# Patient Record
Sex: Female | Born: 1960 | Race: White | Hispanic: No | Smoking: Never smoker
Health system: Southern US, Community
[De-identification: ages and names within clinical notes are randomized; demographics above are authoritative.]

## PROBLEM LIST (undated history)

## (undated) DIAGNOSIS — I2510 Atherosclerotic heart disease of native coronary artery without angina pectoris: Secondary | ICD-10-CM

## (undated) DIAGNOSIS — E119 Type 2 diabetes mellitus without complications: Secondary | ICD-10-CM

## (undated) DIAGNOSIS — I809 Phlebitis and thrombophlebitis of unspecified site: Secondary | ICD-10-CM

## (undated) DIAGNOSIS — E785 Hyperlipidemia, unspecified: Secondary | ICD-10-CM

## (undated) DIAGNOSIS — J189 Pneumonia, unspecified organism: Secondary | ICD-10-CM

## (undated) DIAGNOSIS — F419 Anxiety disorder, unspecified: Secondary | ICD-10-CM

## (undated) DIAGNOSIS — I350 Nonrheumatic aortic (valve) stenosis: Secondary | ICD-10-CM

## (undated) DIAGNOSIS — I219 Acute myocardial infarction, unspecified: Secondary | ICD-10-CM

## (undated) DIAGNOSIS — E669 Obesity, unspecified: Secondary | ICD-10-CM

## (undated) DIAGNOSIS — IMO0002 Reserved for concepts with insufficient information to code with codable children: Secondary | ICD-10-CM

## (undated) DIAGNOSIS — E7800 Pure hypercholesterolemia, unspecified: Secondary | ICD-10-CM

## (undated) DIAGNOSIS — F32A Depression, unspecified: Secondary | ICD-10-CM

## (undated) DIAGNOSIS — I509 Heart failure, unspecified: Secondary | ICD-10-CM

## (undated) DIAGNOSIS — T7840XA Allergy, unspecified, initial encounter: Secondary | ICD-10-CM

## (undated) DIAGNOSIS — I10 Essential (primary) hypertension: Secondary | ICD-10-CM

## (undated) DIAGNOSIS — F329 Major depressive disorder, single episode, unspecified: Secondary | ICD-10-CM

## (undated) DIAGNOSIS — K219 Gastro-esophageal reflux disease without esophagitis: Secondary | ICD-10-CM

## (undated) DIAGNOSIS — R011 Cardiac murmur, unspecified: Secondary | ICD-10-CM

## (undated) DIAGNOSIS — M069 Rheumatoid arthritis, unspecified: Secondary | ICD-10-CM

## (undated) HISTORY — PX: FOOT FRACTURE SURGERY: SHX645

## (undated) HISTORY — DX: Phlebitis and thrombophlebitis of unspecified site: I80.9

## (undated) HISTORY — DX: Type 2 diabetes mellitus without complications: E11.9

## (undated) HISTORY — PX: CATARACT EXTRACTION: SUR2

## (undated) HISTORY — DX: Reserved for concepts with insufficient information to code with codable children: IMO0002

## (undated) HISTORY — PX: FRACTURE SURGERY: SHX138

## (undated) HISTORY — DX: Pure hypercholesterolemia, unspecified: E78.00

## (undated) HISTORY — PX: AORTIC VALVE REPLACEMENT (AVR)/CORONARY ARTERY BYPASS GRAFTING (CABG): SHX5725

## (undated) HISTORY — DX: Hyperlipidemia, unspecified: E78.5

## (undated) HISTORY — DX: Atherosclerotic heart disease of native coronary artery without angina pectoris: I25.10

## (undated) HISTORY — PX: NEUROPLASTY / TRANSPOSITION MEDIAN NERVE AT CARPAL TUNNEL: SUR893

## (undated) HISTORY — PX: CORONARY ARTERY BYPASS GRAFT: SHX141

## (undated) HISTORY — DX: Obesity, unspecified: E66.9

## (undated) HISTORY — DX: Heart failure, unspecified: I50.9

## (undated) HISTORY — PX: KNEE ARTHROSCOPY: SHX127

## (undated) HISTORY — DX: Rheumatoid arthritis, unspecified: M06.9

## (undated) HISTORY — DX: Allergy, unspecified, initial encounter: T78.40XA

## (undated) HISTORY — DX: Nonrheumatic aortic (valve) stenosis: I35.0

## (undated) HISTORY — DX: Essential (primary) hypertension: I10

---

## 1993-11-19 HISTORY — PX: TUBAL LIGATION: SHX77

## 2011-04-04 ENCOUNTER — Encounter (INDEPENDENT_AMBULATORY_CARE_PROVIDER_SITE_OTHER): Payer: Medicare Other

## 2011-04-04 DIAGNOSIS — I359 Nonrheumatic aortic valve disorder, unspecified: Secondary | ICD-10-CM

## 2011-04-05 NOTE — Consult Note (Signed)
[facility] NEW PATIENT CONSULTATION  Madison [name], Madison [name] DOB:  10-01-1961                                        Apr 04, 2011 CHART #:  [id number]  REASON FOR CONSULTATION:  Severe aortic stenosis.  CLINICAL HISTORY:  I was asked by Dr. Beverely Pace, to evaluate the patient for consideration of aortic valve replacement.  She is a 50 year old woman with a known history of aortic stenosis that has been followed with periodic echocardiogram.  Her last echocardiogram was done on January 15, 2011 and showed severe aortic stenosis with a valve area of 0.6 cm2 with a peak velocity of 559 cm/sec and peak gradient of 125 mmHg and the mean gradient of 76 mmHg.  There is mild aortic insufficiency.  It was felt that the valve was probably bicuspid.  Left ventricular size was normal with mild left ventricular hypertrophy and normal systolic function.  There is abnormal diastolic function.  The mitral valve and mild mitral annular calcification and trivial regurgitation.  The aortic root size appeared normal.  Ejection fraction was estimated 65%.  The patient subsequently underwent an exercise stress echocardiogram which showed ischemia noted in the basilar inferior wall with dyskinesis developing postexercise.  She did experience shortness of breath and test was terminated due to her shortness of breath, tiredness, and fatigue.  She underwent cardiac catheterization on Mar 29, 2011, which showed mild nonobstructive coronary artery disease with severe aortic stenosis with valve area of 0.6 cm2.  The peak great was measured 91 with mean gradient of 75.  REVIEW OF SYSTEMS:  GENERAL:  She denies any fever or chills.  She has had no recent weight changes.  She does report fatigue with exertion. EYES:  Negative.  She denies any amaurosis fugax. ENT:  Negative. ENDOCRINE:  She does have adult-onset diabetes.  She denies hypothyroidism. CARDIOVASCULAR:  She denies any chest pain or pressure.  She  does report exertional dyspnea.  She denies orthopnea or PND.  She has had no palpitations or peripheral edema. RESPIRATORY:  She denies cough and sputum production. GI:  She has had no nausea or vomiting.  She denies melena and bright red blood per rectum. GU:  She denies dysuria or hematuria. MUSCULOSKELETAL:  She does have rheumatoid arthritis. 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  She has had one episode in her left arm, face and leg became numb and weak for several hours and then resolved.  Her other 2 strokes were less severe but she does not remember the symptoms. She has been maintained on long-term Coumadin due to these strokes.  She said that all of her CT scans of the brain were normal.  She denies any focal weakness or numbness.  She does have occasional episodes of dizziness which she thought it was due to her blood sugar.  This usually occur with standing up from sitting position. PSYCHIATRIC:  Negative HEMATOLOGICAL:  She has no history of easy bleeding or bleeding disorders.  ALLERGIES:  None.  PAST MEDICAL HISTORY:  Significant for hypertension and dyslipidemia. She has a history of diabetes.  She does have rheumatoid arthritis.  She has a history of stroke as mentioned above.  Known history of aortic stenosis.  PAST SURGICAL HISTORY:  Bilateral carpal tunnel repair, C-section, and some surgery on her foot.  MEDICATIONS:  1. Ambien 10 mg nightly p.r.n. 2. Alprazolam 0.5 mg t.i.d. p.r.n. 3. Pravachol 40 mg daily. 4. Warfarin 7.5 mg daily.  INR managed by Dr. Glean Hess. 5. Cozaar 50 mg daily. 6. Hydrocodone/acetaminophen 7.5/500 mg p.r.n. 7. Celexa 40 mg daily. 8. Methotrexate 5 tablets q.7 days. 9. Humalog through insulin pump. 10.Folic acid 1 tablet daily on days that she does not take     methotrexate. 11.Prilosec 20 mg twice daily. 12.Metformin 1000 mg daily. 13.Cymbalta 30 mg daily.  SOCIAL HISTORY:  She is  divorced.  She has two children, age 16 years and 16 years.  She has never smoked and denies alcohol use.  FAMILY HISTORY:  Positive for stroke.  PHYSICAL EXAMINATION:  VITAL SIGNS:  Blood pressure is 115/75, pulse is 85 and regular, respiratory rate is 16 and unlabored.  She is an obese white female in no distress.  HEENT exam shows normocephalic and atraumatic.  Pupils are equal and reactive to light and accommodation. Extraocular muscles are intact.  Oropharynx is clear.  Neck exam shows normal carotid pulses bilaterally.  There is a transmitted murmur to both sides of her neck.  There is no adenopathy or thyromegaly.  Cardiac exam shows a regular rate and rhythm with grade 3/6 systolic murmur of aortic stenosis.  There is no diastolic murmur.  Lungs are clear. Abdominal exam shows active bowel sounds.  Abdomen is soft, obese and nontender.  No palpable masses or organomegaly.  Extremity exam shows no peripheral edema.  Pedal pulses are palpable bilaterally.  Skin is warm and dry.  Neurologic exam shows be alert and oriented x3.  Motor and sensory exams grossly normal.  IMPRESSION:  The patient has severe aortic stenosis that is symptomatic with an abnormal stress echocardiogram.  I agree at this time to proceed with aortic valve replacement.  Her ascending aorta appears to be abnormal size by echocardiogram and catheterization and does not require replacement.  Given her age I would plan to use mechanical valve, especially since she has been a long-term Coumadin already for three previous strokes.  It is possible that her strokes could be related to her calcified aortic valve.  I discussed the aortic valve replacement surgery with her and sister including use of mechanical valve.  We discussed the pros and cons of mechanical and tissue valves and she is in agreement with using a mechanical valve.  I discussed the benefits and risks of surgery including but not limited to bleeding,  blood transfusion, infection, stroke, myocardial infarction, heart block requiring permanent pacemaker, organ dysfunction, and death.  She understands all this and would like to proceed with surgery.  We will plan to proceed with surgery on Monday, Apr 16, 2011.  Evelene Croon, M.D. Electronically Signed  BB/MEDQ  D:  04/04/2011  T:  04/05/2011  Job:  [id number]  cc:   Dr. Glean Hess

## 2011-04-17 DIAGNOSIS — I359 Nonrheumatic aortic valve disorder, unspecified: Secondary | ICD-10-CM

## 2013-04-30 LAB — PROTIME-INR

## 2013-07-27 ENCOUNTER — Encounter: Payer: Self-pay | Admitting: Cardiovascular Disease

## 2013-07-27 ENCOUNTER — Encounter: Payer: Self-pay | Admitting: *Deleted

## 2013-07-27 DIAGNOSIS — I2510 Atherosclerotic heart disease of native coronary artery without angina pectoris: Secondary | ICD-10-CM | POA: Insufficient documentation

## 2013-07-27 DIAGNOSIS — I350 Nonrheumatic aortic (valve) stenosis: Secondary | ICD-10-CM | POA: Insufficient documentation

## 2013-07-27 DIAGNOSIS — M069 Rheumatoid arthritis, unspecified: Secondary | ICD-10-CM | POA: Insufficient documentation

## 2013-07-27 DIAGNOSIS — I639 Cerebral infarction, unspecified: Secondary | ICD-10-CM | POA: Insufficient documentation

## 2013-07-27 DIAGNOSIS — I10 Essential (primary) hypertension: Secondary | ICD-10-CM | POA: Insufficient documentation

## 2013-07-27 DIAGNOSIS — I809 Phlebitis and thrombophlebitis of unspecified site: Secondary | ICD-10-CM | POA: Insufficient documentation

## 2013-07-27 DIAGNOSIS — E669 Obesity, unspecified: Secondary | ICD-10-CM | POA: Insufficient documentation

## 2013-07-27 DIAGNOSIS — E785 Hyperlipidemia, unspecified: Secondary | ICD-10-CM | POA: Insufficient documentation

## 2013-07-28 ENCOUNTER — Ambulatory Visit (INDEPENDENT_AMBULATORY_CARE_PROVIDER_SITE_OTHER): Payer: PRIVATE HEALTH INSURANCE | Admitting: *Deleted

## 2013-07-28 ENCOUNTER — Ambulatory Visit (INDEPENDENT_AMBULATORY_CARE_PROVIDER_SITE_OTHER): Payer: PRIVATE HEALTH INSURANCE | Admitting: Cardiovascular Disease

## 2013-07-28 VITALS — BP 104/50 | Ht 63.0 in | Wt 130.0 lb

## 2013-07-28 DIAGNOSIS — I359 Nonrheumatic aortic valve disorder, unspecified: Secondary | ICD-10-CM | POA: Insufficient documentation

## 2013-07-28 DIAGNOSIS — Z952 Presence of prosthetic heart valve: Secondary | ICD-10-CM | POA: Insufficient documentation

## 2013-07-28 DIAGNOSIS — Z954 Presence of other heart-valve replacement: Secondary | ICD-10-CM

## 2013-07-28 DIAGNOSIS — Z7901 Long term (current) use of anticoagulants: Secondary | ICD-10-CM | POA: Insufficient documentation

## 2013-07-28 DIAGNOSIS — E119 Type 2 diabetes mellitus without complications: Secondary | ICD-10-CM

## 2013-07-28 MED ORDER — WARFARIN SODIUM 5 MG PO TABS: 5.0000 mg | ORAL_TABLET | Freq: Every day | ORAL | Status: DC

## 2013-07-28 NOTE — Assessment & Plan Note (Signed)
No bleeding issues  Coumadin 5mg  daily and 7.5 on Wends/Sunday.  Check INR today Script refilled F/U coumadin clinic

## 2013-07-28 NOTE — Patient Instructions (Addendum)

## 2013-07-28 NOTE — Assessment & Plan Note (Signed)
Well controlled.  Continue current medications and low sodium Dash type diet.    

## 2013-07-28 NOTE — Patient Instructions (Addendum)
Your physician wants you to follow-up in:    April   WITH DR   Eden Emms  ECHO SAME DAY   You will receive a reminder letter in the mail two months in advance. If you don't receive a letter, please call our office to schedule the follow-up appointment. Your physician has requested that you have an echocardiogram. Echocardiography is a painless test that uses sound waves to create images of your heart. It provides your doctor with information about the size and shape of your heart and how well your heart's chambers and valves are working. This procedure takes approximately one hour. There are no restrictions for this procedure. DUE IN  April  SEE DR Eden Emms SAME DAY You have been referred to PMD   [facility]  OFFICE

## 2013-07-28 NOTE — Assessment & Plan Note (Signed)
Discussed SBE prophylaxis.  Report indicates early perivalvular leak.  No AR on exam  F/U echo 4/15

## 2013-07-28 NOTE — Progress Notes (Signed)
Patient ID: Madison [name], female   DOB: 1961/02/21, 52 y.o.   MRN: [id number] 52 yo with previous care in [place].  History of bicuspid AV with AVR 2012. Chronic coumadin.  Echo done 03/02/13 showed EF normal Peak fradient and mean 8 mmHg across AVR with multiple perivalvular leaks and "strands"  Describes multiple strokes after AVR  Not clear if anticoagulation was adequate.  Currently has some aphasiia and left hand weakness but overall does well.  Needs a primary care doctor  Needs refills on coumadin and to be established in coumadin clinic.  No dyspnea, chest pain or syncope.  Sister drove her to her appt today.  Review of [facility] records show no CAD at cath prior to surgery   ROS: Denies fever, malais, weight loss, blurry vision, decreased visual acuity, cough, sputum, SOB, hemoptysis, pleuritic pain, palpitaitons, heartburn, abdominal pain, melena, lower extremity edema, claudication, or rash.  All other systems reviewed and negative   General: Affect appropriate Healthy:  appears stated age HEENT: normal Neck supple with no adenopathy JVP normal no bruits no thyromegaly Lungs clear with no wheezing and good diaphragmatic motion Heart:  S1/S2click SEM  no AR murmur,rub, gallop or click PMI normal Abdomen: benighn, BS positve, no tenderness, no AAA no bruit.  No HSM or HJR Distal pulses intact with no bruits No edema Neuro mild aphasia and left hand weakness  Right hand dominant Skin warm and dry No muscular weakness  Medications Current Outpatient Prescriptions  Medication Sig Dispense Refill  . aspirin 325 MG tablet Take 325 mg by mouth daily.      . baclofen (LIORESAL) 10 MG tablet Take 10 mg by mouth every morning.      . citalopram (CELEXA) 40 MG tablet Take 40 mg by mouth daily.      . metFORMIN (GLUCOPHAGE) 500 MG tablet Take 500 mg by mouth.      . pravastatin (PRAVACHOL) 20 MG tablet Take 20 mg by mouth daily.      . ranitidine (ZANTAC) 150 MG capsule Take 150 mg by mouth 2  (two) times daily.      Marland Kitchen warfarin (COUMADIN) 5 MG tablet Take 5 mg by mouth daily.       No current facility-administered medications for this visit.    Allergies Review of patient's allergies indicates no known allergies.  Family History: Family History  Problem Relation Age of Onset  . Stroke      Social History: History   Social History  . Marital Status: Single    Spouse Name: N/A    Number of Children: N/A  . Years of Education: N/A   Occupational History  . Not on file.   Social History Main Topics  . Smoking status: Never Smoker   . Smokeless tobacco: Not on file  . Alcohol Use: Not on file  . Drug Use: Not on file  . Sexual Activity: Not on file   Other Topics Concern  . Not on file   Social History Narrative  . No narrative on file    Electrocardiogram:  02/28/12  SR rate 77 nomral   Today NSR rate 77 LAE RAD   Assessment and Plan

## 2013-07-28 NOTE — Assessment & Plan Note (Signed)
Cholesterol is at goal.  Continue current dose of statin and diet Rx.  No myalgias or side effects.  F/U  LFT's in 6 months. No results found for this basename: LDLCALC  Labs to be checked by new primary

## 2013-07-31 ENCOUNTER — Ambulatory Visit (INDEPENDENT_AMBULATORY_CARE_PROVIDER_SITE_OTHER): Payer: PRIVATE HEALTH INSURANCE | Admitting: Endocrinology

## 2013-07-31 ENCOUNTER — Encounter: Payer: Self-pay | Admitting: Endocrinology

## 2013-07-31 VITALS — BP 130/70 | HR 78 | Ht 63.0 in | Wt 129.0 lb

## 2013-07-31 DIAGNOSIS — E1049 Type 1 diabetes mellitus with other diabetic neurological complication: Secondary | ICD-10-CM

## 2013-07-31 LAB — HEMOGLOBIN A1C: Hgb A1c MFr Bld: 11.8 % — ABNORMAL HIGH (ref 4.6–6.5)

## 2013-07-31 LAB — MICROALBUMIN / CREATININE URINE RATIO: Microalb, Ur: 0.1 mg/dL (ref 0.0–1.9)

## 2013-07-31 MED ORDER — INSULIN GLARGINE 100 UNIT/ML SOLOSTAR PEN: 5.0000 [IU] | PEN_INJECTOR | Freq: Every day | SUBCUTANEOUS | Status: DC

## 2013-07-31 MED ORDER — INSULIN ASPART 100 UNIT/ML FLEXPEN: 10.0000 [IU] | PEN_INJECTOR | Freq: Three times a day (TID) | SUBCUTANEOUS | Status: DC

## 2013-07-31 MED ORDER — INSULIN LISPRO 100 UNIT/ML (KWIKPEN): 10.0000 [IU] | PEN_INJECTOR | Freq: Three times a day (TID) | SUBCUTANEOUS | Status: DC

## 2013-07-31 NOTE — Patient Instructions (Addendum)
good diet and exercise habits significanly improve the control of your diabetes.  please let me know if you wish to be referred to a dietician.  high blood sugar is very risky to your health.  you should see an eye doctor every year.  You are at higher than average risk for pneumonia and hepatitis-B.  You should be vaccinated against both.   controlling your blood pressure and cholesterol drastically reduces the damage diabetes does to your body.  this also applies to quitting smoking.  please discuss these with your doctor.  check your blood sugar 4 times a day: before the 3 meals, and at bedtime.  also check if you have symptoms of your blood sugar being too high or too low.  please keep a record of the readings and bring it to your next appointment here.  please call us sooner if your blood sugar goes below 70, or if you have a lot of readings over 200. For now, please take 5 units of the lantus at bedtime, and: Take 10 units of novolog 3 times a day (just before each meal), no matter what your blood sugar is.   Please come back for a follow-up appointment in 2 weeks.

## 2013-07-31 NOTE — Progress Notes (Signed)
Subjective:    Patient ID: Madison [name], female    DOB: [birth year], 52 y.o.   MRN: [id number]  HPI pt states DM was dx'ed in 1981; he has moderate neuropathy of the lower extremities; she has associated retinopathy, CVA's, and CAD.  she has been on insulin since dx.  pt says his diet and exercise are good. Pt says she was on insulin pump rx until CVA's prevented her from using the device.   Pt describes her cbg's as extremely variable.  She takes lantus 7 units qd, and prn humalog (averages a total of approx 30 units per day).  She has hypoglycemia once-twice a day, usually in the middle of the day.   Past Medical History  Diagnosis Date  . CAD (coronary artery disease)   . Obesity   . Hypercholesteremia   . HTN (hypertension)   . Dyslipidemia   . Stroke syndrome   . Aortic stenosis   . Thrombophlebitis   . Diabetes   . Hyperlipidemia   . Rheumatoid arthritis(714.0)     Past Surgical History  Procedure Laterality Date  . Cesarean section    . Foot fracture surgery    . Knee surgery    . Neuroplasty / transposition median nerve at carpal tunnel      History   Social History  . Marital Status: Single    Spouse Name: N/A    Number of Children: N/A  . Years of Education: N/A   Occupational History  . Not on file.   Social History Main Topics  . Smoking status: Never Smoker   . Smokeless tobacco: Not on file  . Alcohol Use: Not on file  . Drug Use: Not on file  . Sexual Activity: Not on file   Other Topics Concern  . Not on file   Social History Narrative  . No narrative on file    Current Outpatient Prescriptions on File Prior to Visit  Medication Sig Dispense Refill  . aspirin 325 MG tablet Take 325 mg by mouth daily.      . baclofen (LIORESAL) 10 MG tablet Take 10 mg by mouth every morning.      . citalopram (CELEXA) 40 MG tablet Take 40 mg by mouth daily.      . metFORMIN (GLUCOPHAGE) 500 MG tablet Take 500 mg by mouth.      . pravastatin (PRAVACHOL) 20 MG  tablet Take 20 mg by mouth daily.      . ranitidine (ZANTAC) 150 MG capsule Take 150 mg by mouth 2 (two) times daily.      Marland Kitchen warfarin (COUMADIN) 5 MG tablet Take 1 tablet (5 mg total) by mouth daily.  45 tablet  3   No current facility-administered medications on file prior to visit.    No Known Allergies  Family History  Problem Relation Age of Onset  . Stroke    [id number] and 1 sib  BP 130/70  Pulse 78  Ht 5\' 3"  (1.6 m)  Wt 129 lb (58.514 kg)  BMI 22.86 kg/m2  SpO2 97%  Review of Systems denies blurry vision, headache, chest pain, sob, n/v, cramps, excessive diaphoresis, memory loss,  Menopausal sxs, and rhinorrhea.  She has lost 120 lbs over the past few years. She has polyuria and easy bruising.  Depression is pretty well-controlled.       Objective:   Physical Exam VS: see vs page GEN: no distress HEAD: head: no deformity eyes: no periorbital swelling, no proptosis external nose and  ears are normal mouth: no lesion seen NECK: supple, thyroid is not enlarged CHEST WALL: no deformity LUNGS:  Clear to auscultation CV: reg rate and rhythm, no murmur.  Mechanical valve click audible. ABD: abdomen is soft, nontender.  no hepatosplenomegaly.  not distended.  no hernia MUSCULOSKELETAL: muscle bulk and strength are grossly normal.  no obvious joint swelling.  gait is steady, with  cane PULSES: no carotid bruit NEURO:  cn 2-12 grossly intact, except for motor aphasia.   readily moves all 4's, but the LUE is weak. SKIN:  Normal texture and temperature.  No rash or suspicious lesion is visible.   NODES:  None palpable at the neck PSYCH: alert, oriented x3.  Does not appear anxious nor depressed.   outside test results are reviewed: (a1c was 9%, 2 mos ago) Lab Results  Component Value Date   HGBA1C 11.8* 07/31/2013      Assessment & Plan:  DM: This insulin regimen was chosen from multiple options, as it best matches her insulin to her changing requirements throughout the  day.  The benefits of glycemic control must be weighed against the risks of hypoglycemia.   CVA's: these limit her ability to use a pump. Weight loss, possibly due to hyperglycemia. CAD: in this context, she should avoid hypoglycemia

## 2013-08-04 ENCOUNTER — Ambulatory Visit (INDEPENDENT_AMBULATORY_CARE_PROVIDER_SITE_OTHER): Payer: PRIVATE HEALTH INSURANCE

## 2013-08-04 DIAGNOSIS — Z954 Presence of other heart-valve replacement: Secondary | ICD-10-CM

## 2013-08-04 DIAGNOSIS — I359 Nonrheumatic aortic valve disorder, unspecified: Secondary | ICD-10-CM

## 2013-08-04 DIAGNOSIS — Z7901 Long term (current) use of anticoagulants: Secondary | ICD-10-CM

## 2013-08-04 DIAGNOSIS — Z952 Presence of prosthetic heart valve: Secondary | ICD-10-CM

## 2013-08-04 LAB — POCT INR: INR: 5.6

## 2013-08-05 ENCOUNTER — Telehealth: Payer: Self-pay | Admitting: Endocrinology

## 2013-08-05 NOTE — Telephone Encounter (Signed)
Pt advised.

## 2013-08-05 NOTE — Telephone Encounter (Signed)
Please increase novolog to 15 units 3 times a day (just before each meal) Please continue the same lantus i'll see you next time.

## 2013-08-06 ENCOUNTER — Ambulatory Visit (INDEPENDENT_AMBULATORY_CARE_PROVIDER_SITE_OTHER): Payer: PRIVATE HEALTH INSURANCE | Admitting: Family

## 2013-08-06 ENCOUNTER — Encounter: Payer: Self-pay | Admitting: Family

## 2013-08-06 VITALS — BP 120/60 | HR 83 | Ht 62.5 in | Wt 131.5 lb

## 2013-08-06 DIAGNOSIS — Z1211 Encounter for screening for malignant neoplasm of colon: Secondary | ICD-10-CM

## 2013-08-06 DIAGNOSIS — E1165 Type 2 diabetes mellitus with hyperglycemia: Secondary | ICD-10-CM

## 2013-08-06 DIAGNOSIS — Z23 Encounter for immunization: Secondary | ICD-10-CM

## 2013-08-06 DIAGNOSIS — R32 Unspecified urinary incontinence: Secondary | ICD-10-CM

## 2013-08-06 DIAGNOSIS — IMO0001 Reserved for inherently not codable concepts without codable children: Secondary | ICD-10-CM

## 2013-08-06 DIAGNOSIS — I10 Essential (primary) hypertension: Secondary | ICD-10-CM

## 2013-08-06 DIAGNOSIS — M1990 Unspecified osteoarthritis, unspecified site: Secondary | ICD-10-CM

## 2013-08-06 DIAGNOSIS — F3289 Other specified depressive episodes: Secondary | ICD-10-CM

## 2013-08-06 DIAGNOSIS — F329 Major depressive disorder, single episode, unspecified: Secondary | ICD-10-CM

## 2013-08-06 LAB — POCT URINALYSIS DIPSTICK
Ketones, UA: NEGATIVE
Leukocytes, UA: NEGATIVE
Nitrite, UA: NEGATIVE
Protein, UA: NEGATIVE
pH, UA: 7

## 2013-08-06 MED ORDER — CELECOXIB 200 MG PO CAPS: 200.0000 mg | ORAL_CAPSULE | Freq: Two times a day (BID) | ORAL | Status: DC

## 2013-08-06 MED ORDER — TOLTERODINE TARTRATE ER 2 MG PO CP24: 2.0000 mg | ORAL_CAPSULE | Freq: Every day | ORAL | Status: DC

## 2013-08-06 MED ORDER — CITALOPRAM HYDROBROMIDE 40 MG PO TABS: 40.0000 mg | ORAL_TABLET | Freq: Every day | ORAL | Status: DC

## 2013-08-06 NOTE — Patient Instructions (Addendum)
Recheck INR in 10 days.   Overactive Bladder, Adult The bladder has two functions that are totally opposite of the other. One is to relax and stretch out so it can store urine (fills like a balloon), and the other is to contract and squeeze down so that it can empty the urine that it has stored. Proper functioning of the bladder is a complex mixing of these two functions. The filling and emptying of the bladder can be influenced by:  The bladder.  The spinal cord.  The brain.  The nerves going to the bladder.  Other organs that are closely related to the bladder such as prostate in males and the vagina in females. As your bladder fills with urine, nerve signals are sent from the bladder to the brain to tell you that you may need to urinate. Normal urination requires that the bladder squeeze down with sufficient strength to empty the bladder, but this also requires that the bladder squeeze down sufficiently long to finish the job. In addition the sphincter muscles, which normally keep you from leaking urine, must also relax so that the urine can pass. Coordination between the bladder muscle squeezing down and the sphincter muscles relaxing is required to make everything happen normally. With an overactive bladder sometimes the muscles of the bladder contract unexpectedly and involuntarily and this causes an urgent need to urinate. The normal response is to try to hold urine in by contracting the sphincter muscles. Sometimes the bladder contracts so strongly that the sphincter muscles cannot stop the urine from passing out and incontinence occurs. This kind of incontinence is called urge incontinence. Having an overactive bladder can be embarrassing and awkward. It can keep you from living life the way you want to. Many people think it is just something you have to put up with as you grow older or have certain health conditions. In fact, there are treatments that can help make your life easier and more  pleasant. CAUSES  Many things can cause an overactive bladder. Possibilities include:  Urinary tract infection or infection of nearby tissues such as the prostate.  Prostate enlargement.  In women, multiple pregnancies or surgery on the uterus or urethra.  Bladder stones, inflammation or tumors.  Caffeine.  Alcohol.  Medications. For example, diuretics (drugs that help the body get rid of extra fluid) increase urine production. Some other medicines must be taken with lots of fluids.  Muscle or nerve weakness. This might be the result of a spinal cord injury, a stroke, multiple sclerosis or Parkinson's disease.  Diabetes can cause a high urine volume which fills the bladder so quickly that the normal urge to urinate is triggered very strongly. SYMPTOMS   Loss of bladder control. You feel the need to urinate and cannot make your body wait.  Sudden, strong urges to urinate.  Urinating 8 or more times a day.  Waking up to urinate two or more times a night. DIAGNOSIS  To decide if you have overactive bladder, your healthcare provider will probably:  Ask about symptoms you have noticed.  Ask about your overall health. This will include questions about any medications you are taking.  Do a physical examination. This will help determine if there are obvious blockages or other problems.  Order some tests. These might include:  A blood test to check for diabetes or other health issues that could be contributing to the problem.  Urine testing. This could measure the flow of urine and the pressure on the  bladder.  A test of your neurological system (the brain, spinal cord and nerves). This is the system that senses the need to urinate. Some of these tests are called flow tests, bladder pressure tests and electrical measurements of the sphincter muscle.  A bladder test to check whether it is emptying completely when you urinate.  Cytoscopy. This test uses a thin tube with a tiny  camera on it. It offers a look inside your urethra and bladder to see if there are problems.  Imaging tests. You might be given a contrast dye and then asked to urinate. X-rays are taken to see how your bladder is working. TREATMENT  An overactive bladder can be treated in many ways. The treatment will depend on the cause. Whether you have a mild or severe case also makes a difference. Often, treatment can be given in your healthcare provider's office or clinic. Be sure to discuss the different options with your caregiver. They include:  Behavioral treatments. These do not involve medication or surgery:  Bladder training. For this, you would follow a schedule to urinate at regular intervals. This helps you learn to control the urge to urinate. At first, you might be asked to wait a few minutes after feeling the urge. In time, you should be able to schedule bathroom visits an hour or more apart.  Kegel exercises. These exercises strengthen the pelvic floor muscles, which support the bladder. By toning these muscles, they can help control urination, even if the bladder muscles are overactive. A specialist will teach you how to do these exercises correctly. They will require daily practice.  Weight loss. If you are obese or overweight, losing weight might stop your bladder from being overactive. Talk to your healthcare provider about how many pounds you should lose. Also ask if there is a specific program or method that would work best for you.  Diet change. This might be suggested if constipation is making your overactive bladder worse. Your healthcare provider or a nutritionist can explain ways to change what you eat to ease constipation. Other people might need to take in less caffeine or alcohol. Sometimes drinking fewer fluids is needed, too.  Protection. This is not an actual treatment. But, you could wear special pads to take care of any leakage while you wait for other treatments to take effect.  This will help you avoid embarrassment.  Physical treatments.  Electrical stimulation. Electrodes will send gentle pulses to the nerves or muscles that help control the bladder. The goal is to strengthen them. Sometimes this is done with the electrodes outside of the body. Or, they might be placed inside the body (implanted). This treatment can take several months to have an effect.  Medications. These are usually used along with other treatments. Several medicines are available. Some are injected into the muscles involved in urination. Others come in pill form. Medications sometimes prescribed include:  Anticholinergics. These drugs block the signals that the nerves deliver to the bladder. This keeps it from releasing urine at the wrong time. Researchers think the drugs might help in other ways, too.  Imipramine. This is an antidepressant. But, it relaxes bladder muscles.  Botox. This is still experimental. Some people believe that injecting it into the bladder muscles will relax them so they work more normally. It has also been injected into the sphincter muscle when the sphincter muscle does not open properly. This is a temporary fix, however. Also, it might make matters worse, especially in older people.  Surgery.  A device might be implanted to help manage your nerves. It works on the nerves that signal when you need to urinate.  Surgery is sometimes needed with electrical stimulation. If the electrodes are implanted, this is done through surgery.  Sometimes repairs need to be made through surgery. For example, the size of the bladder can be changed. This is usually done in severe cases only. HOME CARE INSTRUCTIONS   Take any medications your healthcare provider prescribed or suggested. Follow the directions carefully.  Practice any lifestyle changes that are recommended. These might include:  Drinking less fluid or drinking at different times of the day. If you need to urinate often  during the night, for example, you may need to stop drinking fluids early in the evening.  Cutting down on caffeine or alcohol. They can both make an overactive bladder worse. Caffeine is found in coffee, tea and sodas.  Doing Kegel exercises to strengthen muscles.  Losing weight, if that is recommended.  Eating a healthy and balanced diet. This will help you avoid constipation.  Keep a journal or a log. You might be asked to record how much you drink and when, and also when you feel the need to urinate.  Learn how to care for implants or other devices, such as pessaries. SEEK MEDICAL CARE IF:   Your overactive bladder gets worse.  You feel increased pain or irritation when you urinate.  You notice blood in your urine.  You have questions about any medications or devices that your healthcare provider recommended.  You notice blood, pus or swelling at the site of any test or treatment procedure.  You have an oral temperature above 102 F (38.9 C). SEEK IMMEDIATE MEDICAL CARE IF:  You have an oral temperature above 102 F (38.9 C), not controlled by medicine. Document Released: 09/01/2009 Document Revised: 01/28/2012 Document Reviewed: 09/01/2009 [facility] Patient Information 2014 [place], [place].

## 2013-08-06 NOTE — Progress Notes (Signed)
Subjective:    Patient ID: Madison [name], female    DOB: 1961/10/08, 52 y.o.   MRN: [id number]  HPI 52 year old white female, nonsmoker, new patient to the practice and to be established. She has a history of coronary artery disease, hyperlipidemia, hypertension, CVA, type 2 diabetes, left-sided weakness, and aortic valve disorder requiring Coumadin therapy.. Today, she has complaints of osteoarthritis pain in her right hand that typically occurs first thing in the morning and better as the day progresses. She's been taking Tylenol is not helping.  Also has concerns of urinary frequency, and urgency x6 months. Patient believes that the symptoms started shortly after having an indwelling catheter in the hospital. She denies any burning with urination, blood in her urine or foul-smelling urine.  Review of Systems  HENT: Negative.   Respiratory: Negative.   Cardiovascular: Negative.   Gastrointestinal: Negative.   Endocrine: Negative.   Genitourinary: Positive for urgency and frequency. Negative for dysuria and flank pain.  Musculoskeletal: Positive for arthralgias.       Right hand arthralgia  Skin: Negative.   Neurological: Positive for weakness.       Left hand weakness  Psychiatric/Behavioral: Negative.    Past Medical History  Diagnosis Date  . CAD (coronary artery disease)   . Obesity   . Hypercholesteremia   . HTN (hypertension)   . Dyslipidemia   . Stroke syndrome   . Aortic stenosis   . Thrombophlebitis   . Diabetes   . Hyperlipidemia   . Rheumatoid arthritis(714.0)     History   Social History  . Marital Status: Single    Spouse Name: N/A    Number of Children: N/A  . Years of Education: N/A   Occupational History  . Not on file.   Social History Main Topics  . Smoking status: Never Smoker   . Smokeless tobacco: Not on file  . Alcohol Use: Not on file  . Drug Use: Not on file  . Sexual Activity: Not on file   Other Topics Concern  . Not on file   Social  History Narrative  . No narrative on file    Past Surgical History  Procedure Laterality Date  . Cesarean section    . Foot fracture surgery    . Knee surgery    . Neuroplasty / transposition median nerve at carpal tunnel      Family History  Problem Relation Age of Onset  . Stroke      No Known Allergies  Current Outpatient Prescriptions on File Prior to Visit  Medication Sig Dispense Refill  . aspirin 325 MG tablet Take 325 mg by mouth daily.      . baclofen (LIORESAL) 10 MG tablet Take 10 mg by mouth every morning.      . Insulin Glargine (LANTUS SOLOSTAR) 100 UNIT/ML SOPN Inject 5 Units into the skin at bedtime.  5 pen  PRN  . insulin lispro (HUMALOG KWIKPEN) 100 UNIT/ML SOPN Inject 10 Units into the skin 3 (three) times daily with meals. And pen needles 4/day  10 pen  3  . metFORMIN (GLUCOPHAGE) 500 MG tablet Take 500 mg by mouth.      . pravastatin (PRAVACHOL) 20 MG tablet Take 20 mg by mouth daily.      . ranitidine (ZANTAC) 150 MG capsule Take 150 mg by mouth 2 (two) times daily.      Marland Kitchen warfarin (COUMADIN) 5 MG tablet Take 1 tablet (5 mg total) by mouth daily.  45 tablet  3   No current facility-administered medications on file prior to visit.    BP 120/60  Pulse 83  Ht 5' 2.5" (1.588 m)  Wt 131 lb 8 oz (59.648 kg)  BMI 23.65 kg/m2chart    Objective:   Physical Exam  Constitutional: She is oriented to person, place, and time. She appears well-developed and well-nourished.  HENT:  Right Ear: External ear normal.  Left Ear: External ear normal.  Mouth/Throat: Oropharynx is clear and moist.  Neck: Normal range of motion. Neck supple.  Cardiovascular: Normal rate, regular rhythm and normal heart sounds.   Pulmonary/Chest: Effort normal and breath sounds normal.  Abdominal: Soft. Bowel sounds are normal. She exhibits no distension. There is no tenderness. There is no rebound.  Musculoskeletal: She exhibits no edema and no tenderness.  Left upper extremity  weakness related to CVA. Grip strength decreased.  Neurological: She is alert and oriented to person, place, and time.  Skin: Skin is warm and dry.  Psychiatric: She has a normal mood and affect.          Assessment & Plan:  Assessment: 1. Coronary artery disease 2. Hyperlipidemia 3. Hypertension 4. Type 2 diabetes-uncontrolled 5. Left-sided weakness 6. Aortic valve disorder 7. Overactive bladder A. Osteoarthritis  Plan: Recheck INR in 7-10 days. Start Celebrex 200 mg once a day osteoarthritis. Warned of the potential for INR to be increased. Detrol LA 2 mg once daily. Recheck patient in 3 weeks to be sure she's happy at this dose. If not will increase to 4 mg. Continue to see cardiology and endocrinology as scheduled.

## 2013-08-13 ENCOUNTER — Ambulatory Visit (INDEPENDENT_AMBULATORY_CARE_PROVIDER_SITE_OTHER): Payer: PRIVATE HEALTH INSURANCE

## 2013-08-13 ENCOUNTER — Telehealth: Payer: Self-pay

## 2013-08-13 DIAGNOSIS — Z954 Presence of other heart-valve replacement: Secondary | ICD-10-CM

## 2013-08-13 DIAGNOSIS — I359 Nonrheumatic aortic valve disorder, unspecified: Secondary | ICD-10-CM

## 2013-08-13 DIAGNOSIS — Z952 Presence of prosthetic heart valve: Secondary | ICD-10-CM

## 2013-08-13 DIAGNOSIS — Z7901 Long term (current) use of anticoagulants: Secondary | ICD-10-CM

## 2013-08-13 NOTE — Telephone Encounter (Signed)
Called spoke with pt advised per Dr Eden Emms reduce ASA from 325mg  QD to 81mg  QD.  Pt verbalized understanding.

## 2013-08-13 NOTE — Telephone Encounter (Signed)
Message copied by Migdalia Dk on Thu Aug 13, 2013 11:49 AM ------      Message from: Wendall Stade      Created: Thu Aug 13, 2013 10:46 AM       Can reduce to 81 mg       ----- Message -----         From: Luan Moore, RN         Sent: 08/13/2013  10:03 AM           To: Wendall Stade, MD            Pt is on Coumadin and has been taking 325mg  ASA rx by her previous cardiologist.  Pt wants to know if she should continue on full dose ASA or reduce to 81mg  or eliminate all together.  Please advise.  Thanks       ------

## 2013-08-18 ENCOUNTER — Encounter: Payer: Self-pay | Admitting: Endocrinology

## 2013-08-18 ENCOUNTER — Ambulatory Visit (INDEPENDENT_AMBULATORY_CARE_PROVIDER_SITE_OTHER): Payer: PRIVATE HEALTH INSURANCE | Admitting: Endocrinology

## 2013-08-18 VITALS — BP 134/80 | HR 90 | Ht 62.0 in | Wt 139.0 lb

## 2013-08-18 DIAGNOSIS — E1049 Type 1 diabetes mellitus with other diabetic neurological complication: Secondary | ICD-10-CM

## 2013-08-18 LAB — BASIC METABOLIC PANEL
BUN: 20 mg/dL (ref 6–23)
Chloride: 106 mEq/L (ref 96–112)
Creatinine, Ser: 0.9 mg/dL (ref 0.4–1.2)
GFR: 73.46 mL/min (ref >60.00)
Sodium: 135 mEq/L (ref 135–145)

## 2013-08-18 NOTE — Patient Instructions (Addendum)
check your blood sugar 4 times a day: before the 3 meals, and at bedtime.  also check if you have symptoms of your blood sugar being too high or too low.  please keep a record of the readings and bring it to your next appointment here.  please call us sooner if your blood sugar goes below 70, or if you have a lot of readings over 200. For now, please stop the lantus, and the bedtime snack.  continue 15 units of novolog 3 times a day (just before each meal), no matter what your blood sugar is.   Please come back for a follow-up appointment in 2 weeks.

## 2013-08-18 NOTE — Progress Notes (Signed)
Subjective:    Patient ID: Madison [name], female    DOB: 1961-03-06, 52 y.o.   MRN: [id number]  HPI pt returns for f/u of insulin-requiring DM  (dx'ed 1981; she has moderate neuropathy of the lower extremities; she has associated retinopathy, CVA's, and CAD; she has been on insulin since dx;  she was on insulin pump rx until CVA's prevented her from using the device in early 2014).   no cbg record, but states cbg was 24 yesterday am.  She felt well, except for slight tremor.  In general, however, she says cbg's are highest in am, especially when she eats a snack at bedtime.  However, she also describes frequent hypoglycemia in the middle of the night. Past Medical History  Diagnosis Date  . CAD (coronary artery disease)   . Obesity   . Hypercholesteremia   . HTN (hypertension)   . Dyslipidemia   . Stroke syndrome   . Aortic stenosis   . Thrombophlebitis   . Diabetes   . Hyperlipidemia   . Rheumatoid arthritis(714.0)     Past Surgical History  Procedure Laterality Date  . Cesarean section    . Foot fracture surgery    . Knee surgery    . Neuroplasty / transposition median nerve at carpal tunnel      History   Social History  . Marital Status: Single    Spouse Name: N/A    Number of Children: N/A  . Years of Education: N/A   Occupational History  . Not on file.   Social History Main Topics  . Smoking status: Never Smoker   . Smokeless tobacco: Not on file  . Alcohol Use: Not on file  . Drug Use: Not on file  . Sexual Activity: Not on file   Other Topics Concern  . Not on file   Social History Narrative  . No narrative on file    Current Outpatient Prescriptions on File Prior to Visit  Medication Sig Dispense Refill  . aspirin 81 MG tablet Take 81 mg by mouth daily.      . baclofen (LIORESAL) 10 MG tablet Take 10 mg by mouth every morning.      . celecoxib (CELEBREX) 200 MG capsule Take 1 capsule (200 mg total) by mouth 2 (two) times daily.  30 capsule  3  .  citalopram (CELEXA) 40 MG tablet Take 1 tablet (40 mg total) by mouth daily.  90 tablet  1  . Insulin Glargine (LANTUS SOLOSTAR) 100 UNIT/ML SOPN Inject 5 Units into the skin at bedtime.  5 pen  PRN  . insulin lispro (HUMALOG KWIKPEN) 100 UNIT/ML SOPN Inject 10 Units into the skin 3 (three) times daily with meals. And pen needles 4/day  10 pen  3  . metFORMIN (GLUCOPHAGE) 500 MG tablet Take 500 mg by mouth.      . pravastatin (PRAVACHOL) 20 MG tablet Take 20 mg by mouth daily.      . ranitidine (ZANTAC) 150 MG capsule Take 150 mg by mouth 2 (two) times daily.      Marland Kitchen tolterodine (DETROL LA) 2 MG 24 hr capsule Take 1 capsule (2 mg total) by mouth daily.  30 capsule  2  . warfarin (COUMADIN) 5 MG tablet Take 1 tablet (5 mg total) by mouth daily.  45 tablet  3   No current facility-administered medications on file prior to visit.   No Known Allergies  Family History  Problem Relation Age of Onset  . Stroke  BP 134/80  Pulse 90  Ht 5\' 2"  (1.575 m)  Wt 139 lb (63.05 kg)  BMI 25.42 kg/m2  SpO2 99%  Review of Systems Denies LOC and weight change    Objective:   Physical Exam VITAL SIGNS:  See vs page GENERAL: no distress SKIN:  Insulin injection sites at the anterior abdomen are normal.     Assessment & Plan:  DM: This insulin regimen was chosen from multiple options, as it best matches her insulin to her changing requirements throughout the day.  The benefits of glycemic control must be weighed against the risks of hypoglycemia.  We discussed the possibility of taking humalog according to [facility] content of meals, and she declines.  CVA: this seems to be preventing pump rx. CAD: in this setting, she should avoid hypoglycemia.

## 2013-08-27 ENCOUNTER — Ambulatory Visit (INDEPENDENT_AMBULATORY_CARE_PROVIDER_SITE_OTHER): Payer: PRIVATE HEALTH INSURANCE | Admitting: Family

## 2013-08-27 ENCOUNTER — Encounter: Payer: Self-pay | Admitting: Family

## 2013-08-27 VITALS — BP 108/60 | HR 87 | Wt 139.0 lb

## 2013-08-27 DIAGNOSIS — Z952 Presence of prosthetic heart valve: Secondary | ICD-10-CM

## 2013-08-27 DIAGNOSIS — N3281 Overactive bladder: Secondary | ICD-10-CM

## 2013-08-27 DIAGNOSIS — I359 Nonrheumatic aortic valve disorder, unspecified: Secondary | ICD-10-CM

## 2013-08-27 DIAGNOSIS — Z954 Presence of other heart-valve replacement: Secondary | ICD-10-CM

## 2013-08-27 DIAGNOSIS — N318 Other neuromuscular dysfunction of bladder: Secondary | ICD-10-CM

## 2013-08-27 DIAGNOSIS — M159 Polyosteoarthritis, unspecified: Secondary | ICD-10-CM

## 2013-08-27 DIAGNOSIS — Z7901 Long term (current) use of anticoagulants: Secondary | ICD-10-CM

## 2013-08-27 LAB — POCT INR: INR: 5.2

## 2013-08-27 MED ORDER — SOLIFENACIN SUCCINATE 5 MG PO TABS: 5.0000 mg | ORAL_TABLET | Freq: Every day | ORAL | Status: DC

## 2013-08-27 NOTE — Patient Instructions (Signed)
Hold Coumadin Coumadin Friday, Saturday, or Sunday. Resume Recheck on Monday.  Anticoagulation Dose Instructions as of 08/27/2013     Madison [name] Tue Wed Thu Fri Sat   New Dose 0 mg 5 mg 5 mg 5 mg 5 mg 0 mg 0 mg    Description       Hold Coumadin Coumadin Friday, Saturday, or Sunday. Resume Recheck on Monday.

## 2013-08-27 NOTE — Progress Notes (Signed)
Subjective:    Patient ID: Madison [name], female    DOB: Jun 14, 1961, 52 y.o.   MRN: [id number]  HPI  52 year old white female, nonsmoker is in for recheck of overactive bladder for which we began Detrol LA and osteoarthritis of the hands and she's taken Celebrex 200 mg daily. She's tolerating both medications well. Overall feels like she is doing much better with regard to arthritis and overactive bladder. However, her insurance will only cover a one-month supply of Detrol LA. Therefore, we need to change her medication. She is on Coumadin and is due for PT INR checked today.  Review of Systems  Constitutional: Negative.   HENT: Negative.   Respiratory: Negative.   Cardiovascular: Negative.   Gastrointestinal: Negative.   Genitourinary: Negative for dysuria and frequency.  Musculoskeletal: Negative.  Negative for arthralgias.  Skin: Negative.   Neurological: Negative.   Hematological: Negative.   Psychiatric/Behavioral: Negative.    Past Medical History  Diagnosis Date  . CAD (coronary artery disease)   . Obesity   . Hypercholesteremia   . HTN (hypertension)   . Dyslipidemia   . Stroke syndrome   . Aortic stenosis   . Thrombophlebitis   . Diabetes   . Hyperlipidemia   . Rheumatoid arthritis(714.0)     History   Social History  . Marital Status: Single    Spouse Name: N/A    Number of Children: N/A  . Years of Education: N/A   Occupational History  . Not on file.   Social History Main Topics  . Smoking status: Never Smoker   . Smokeless tobacco: Not on file  . Alcohol Use: Not on file  . Drug Use: Not on file  . Sexual Activity: Not on file   Other Topics Concern  . Not on file   Social History Narrative  . No narrative on file    Past Surgical History  Procedure Laterality Date  . Cesarean section    . Foot fracture surgery    . Knee surgery    . Neuroplasty / transposition median nerve at carpal tunnel      Family History  Problem Relation Age of  Onset  . Stroke      No Known Allergies  Current Outpatient Prescriptions on File Prior to Visit  Medication Sig Dispense Refill  . aspirin 81 MG tablet Take 81 mg by mouth daily.      . baclofen (LIORESAL) 10 MG tablet Take 10 mg by mouth every morning.      . celecoxib (CELEBREX) 200 MG capsule Take 1 capsule (200 mg total) by mouth 2 (two) times daily.  30 capsule  3  . citalopram (CELEXA) 40 MG tablet Take 1 tablet (40 mg total) by mouth daily.  90 tablet  1  . Insulin Glargine (LANTUS SOLOSTAR) 100 UNIT/ML SOPN Inject 5 Units into the skin at bedtime.  5 pen  PRN  . insulin lispro (HUMALOG KWIKPEN) 100 UNIT/ML SOPN Inject 10 Units into the skin 3 (three) times daily with meals. And pen needles 4/day  10 pen  3  . metFORMIN (GLUCOPHAGE) 500 MG tablet Take 500 mg by mouth.      . pravastatin (PRAVACHOL) 20 MG tablet Take 20 mg by mouth daily.      . ranitidine (ZANTAC) 150 MG capsule Take 150 mg by mouth 2 (two) times daily.      Marland Kitchen tolterodine (DETROL LA) 2 MG 24 hr capsule Take 1 capsule (2 mg total) by mouth daily.  30 capsule  2  . warfarin (COUMADIN) 5 MG tablet Take 1 tablet (5 mg total) by mouth daily.  45 tablet  3   No current facility-administered medications on file prior to visit.    BP 108/60  Pulse 87  Wt 139 lb (63.05 kg)  BMI 25.42 kg/m2chart    Objective:   Physical Exam  Constitutional: She is oriented to person, place, and time. She appears well-developed and well-nourished.  HENT:  Right Ear: External ear normal.  Left Ear: External ear normal.  Nose: Nose normal.  Mouth/Throat: Oropharynx is clear and moist.  Neck: Normal range of motion. Neck supple.  Cardiovascular: Normal rate, regular rhythm and normal heart sounds.   Pulmonary/Chest: Effort normal and breath sounds normal.  Abdominal: Soft. Bowel sounds are normal.  Musculoskeletal: Normal range of motion. She exhibits no edema and no tenderness.  Neurological: She is alert and oriented to person,  place, and time.  Skin: Skin is warm and dry.  Psychiatric: She has a normal mood and affect.          Assessment & Plan:  Assessment: 1. Overactive bladder 2. Osteoarthritis of the hands 3. Hypertension 4. Chronic anticoagulation  Plan: DC Detrol LA. Try VESIcare 5 mg once a day. Continue Celebrex. Hold Coumadin x3 days. Recheck with and equal 8 on Monday. She will likely require dosage adjustment consider an anti-inflammatory medication. Patient on the office with any questions or concerns. Check as discussed and sooner if needed.

## 2013-08-31 ENCOUNTER — Ambulatory Visit (INDEPENDENT_AMBULATORY_CARE_PROVIDER_SITE_OTHER): Payer: PRIVATE HEALTH INSURANCE | Admitting: General Practice

## 2013-08-31 DIAGNOSIS — I359 Nonrheumatic aortic valve disorder, unspecified: Secondary | ICD-10-CM

## 2013-08-31 DIAGNOSIS — Z954 Presence of other heart-valve replacement: Secondary | ICD-10-CM

## 2013-08-31 DIAGNOSIS — Z952 Presence of prosthetic heart valve: Secondary | ICD-10-CM

## 2013-08-31 DIAGNOSIS — Z7901 Long term (current) use of anticoagulants: Secondary | ICD-10-CM

## 2013-09-02 ENCOUNTER — Ambulatory Visit (INDEPENDENT_AMBULATORY_CARE_PROVIDER_SITE_OTHER): Payer: PRIVATE HEALTH INSURANCE | Admitting: Endocrinology

## 2013-09-02 ENCOUNTER — Encounter: Payer: Self-pay | Admitting: Endocrinology

## 2013-09-02 VITALS — BP 128/80 | HR 90 | Wt 138.0 lb

## 2013-09-02 DIAGNOSIS — E1049 Type 1 diabetes mellitus with other diabetic neurological complication: Secondary | ICD-10-CM

## 2013-09-02 DIAGNOSIS — E1142 Type 2 diabetes mellitus with diabetic polyneuropathy: Secondary | ICD-10-CM

## 2013-09-02 NOTE — Progress Notes (Signed)
Subjective:    Patient ID: Madison [name], female    DOB: 30-May-1961, 52 y.o.   MRN: [id number]  HPI pt returns for f/u of insulin-requiring DM  (dx'ed 1981, on routine blood test; she has moderate neuropathy of the lower extremities; she has associated retinopathy, CVA's, and CAD; she has been on insulin since dx; she has never had severe hypoglycemia; last episode of DKA was in 2013, due to pump malfunction; she was on insulin pump rx until CVA's prevented her from using the device in early 2014; she declined to dose mealtime insulin based on CHO counting).  no cbg record, but states cbg's are 400-500 in am.  This is despite not eating at hs.  She has mild hypoglycemia in the afternoon.   Past Medical History  Diagnosis Date  . CAD (coronary artery disease)   . Obesity   . Hypercholesteremia   . HTN (hypertension)   . Dyslipidemia   . Stroke syndrome   . Aortic stenosis   . Thrombophlebitis   . Diabetes   . Hyperlipidemia   . Rheumatoid arthritis(714.0)     Past Surgical History  Procedure Laterality Date  . Cesarean section    . Foot fracture surgery    . Knee surgery    . Neuroplasty / transposition median nerve at carpal tunnel      History   Social History  . Marital Status: Single    Spouse Name: N/A    Number of Children: N/A  . Years of Education: N/A   Occupational History  . Not on file.   Social History Main Topics  . Smoking status: Never Smoker   . Smokeless tobacco: Not on file  . Alcohol Use: Not on file  . Drug Use: Not on file  . Sexual Activity: Not on file   Other Topics Concern  . Not on file   Social History Narrative  . No narrative on file    Current Outpatient Prescriptions on File Prior to Visit  Medication Sig Dispense Refill  . aspirin 81 MG tablet Take 81 mg by mouth daily.      . baclofen (LIORESAL) 10 MG tablet Take 10 mg by mouth every morning.      . celecoxib (CELEBREX) 200 MG capsule Take 1 capsule (200 mg total) by mouth 2  (two) times daily.  30 capsule  3  . citalopram (CELEXA) 40 MG tablet Take 1 tablet (40 mg total) by mouth daily.  90 tablet  1  . Insulin Glargine (LANTUS SOLOSTAR) 100 UNIT/ML SOPN Inject 5 Units into the skin at bedtime.  5 pen  PRN  . pravastatin (PRAVACHOL) 20 MG tablet Take 20 mg by mouth daily.      . ranitidine (ZANTAC) 150 MG capsule Take 150 mg by mouth 2 (two) times daily.      . solifenacin (VESICARE) 5 MG tablet Take 1 tablet (5 mg total) by mouth daily.  30 tablet  2  . warfarin (COUMADIN) 5 MG tablet Take 1 tablet (5 mg total) by mouth daily.  45 tablet  3   No current facility-administered medications on file prior to visit.    No Known Allergies  Family History  Problem Relation Age of Onset  . Stroke      BP 128/80  Pulse 90  Wt 138 lb (62.596 kg)  BMI 25.23 kg/m2  SpO2 97%  Review of Systems Denies LOC and weight change    Objective:   Physical Exam VITAL SIGNS:  See vs page GENERAL: no distress PSYCH: Alert and oriented x 3.  Does not appear anxious nor depressed.     Assessment & Plan:  DM: This insulin regimen was chosen from multiple options, as it best matches her insulin to her changing requirements throughout the day.  The benefits of glycemic control must be weighed against the risks of hypoglycemia.  This pattern of cbg's indicates she needs basal insulin. CVA: this seems to be preventing pump rx. CAD: in this setting, she should avoid hypoglycemia.

## 2013-09-02 NOTE — Patient Instructions (Addendum)
check your blood sugar 4 times a day: before the 3 meals, and at bedtime.  also check if you have symptoms of your blood sugar being too high or too low.  please keep a record of the readings and bring it to your next appointment here.  You can write it on any piece of paper.  please call us sooner if your blood sugar goes below 70, or if you have a lot of readings over 200. For now, please stop the lantus, 5 units at bedtime Also, please reduce novolog to 3 times a day (just before each meal), 15-10-15 units.  Please come back for a follow-up appointment in 2-4 weeks.

## 2013-09-04 ENCOUNTER — Ambulatory Visit (INDEPENDENT_AMBULATORY_CARE_PROVIDER_SITE_OTHER): Payer: PRIVATE HEALTH INSURANCE | Admitting: Family Medicine

## 2013-09-04 ENCOUNTER — Encounter: Payer: Self-pay | Admitting: Family Medicine

## 2013-09-04 VITALS — BP 120/68 | HR 70 | Temp 98.3°F | Wt 141.0 lb

## 2013-09-04 DIAGNOSIS — L259 Unspecified contact dermatitis, unspecified cause: Secondary | ICD-10-CM

## 2013-09-04 MED ORDER — TRIAMCINOLONE ACETONIDE 0.1 % EX CREA: TOPICAL_CREAM | Freq: Two times a day (BID) | CUTANEOUS | Status: DC

## 2013-09-04 NOTE — Progress Notes (Signed)
  Subjective:    Patient ID: Madison [name], female    DOB: Sep 29, 1961, 52 y.o.   MRN: [id number]  HPI Acute visit Patient seen with somewhat of a burning rash symmetrically distributed antecubital fossae bilaterally both elbows. She also has involvement of the anterior lower neck. Present for 2-3 days. No clear triggering factors. Denies any itching. No change of soaps or detergents. She uses moisturizing lotion but the same lotion for several years. No recent change of medication. She denies any lower extremity or trunk involvement. No fevers or chills. No history of eczema. No known contact allergies  She has not applied anything specifically to the rash. No clear exacerbating factors  She has chronic problems including type 1 diabetes, history of aortic valve replacement on chronic Coumadin, history of stroke, hyperlipidemia, hypertension, history of CAD  Past Medical History  Diagnosis Date  . CAD (coronary artery disease)   . Obesity   . Hypercholesteremia   . HTN (hypertension)   . Dyslipidemia   . Stroke syndrome   . Aortic stenosis   . Thrombophlebitis   . Diabetes   . Hyperlipidemia   . Rheumatoid arthritis(714.0)    Past Surgical History  Procedure Laterality Date  . Cesarean section    . Foot fracture surgery    . Knee surgery    . Neuroplasty / transposition median nerve at carpal tunnel      reports that she has never smoked. She does not have any smokeless tobacco history on file. Her alcohol and drug histories are not on file. family history includes Stroke in an other family member. No Known Allergies    Review of Systems  Constitutional: Negative for fever and chills.  Skin: Positive for rash.       Objective:   Physical Exam  Constitutional: She appears well-developed and well-nourished.  Cardiovascular: Normal rate.   Pulmonary/Chest: Effort normal and breath sounds normal. No respiratory distress. She has no wheezes. She has no rales.  Skin: Rash  noted.  Patient has erythematous rash which is nonscaly symmetrically distributed antecubital fossa both elbows and very similar rash lower anterior neck. No vesicles. No pustules. Nontender.          Assessment & Plan:  Skin rash. This appears more compatible with contact type dermatitis. No clear triggering factors. Try to avoid systemic steroids with diabetes. Trial of triamcinolone 0.1% cream twice daily. Touch base next week if not improving

## 2013-09-04 NOTE — Patient Instructions (Signed)
Touch base by next week if not improving.

## 2013-09-10 ENCOUNTER — Ambulatory Visit (INDEPENDENT_AMBULATORY_CARE_PROVIDER_SITE_OTHER): Payer: PRIVATE HEALTH INSURANCE | Admitting: Family

## 2013-09-10 ENCOUNTER — Ambulatory Visit: Payer: PRIVATE HEALTH INSURANCE

## 2013-09-10 DIAGNOSIS — N318 Other neuromuscular dysfunction of bladder: Secondary | ICD-10-CM

## 2013-09-10 DIAGNOSIS — M159 Polyosteoarthritis, unspecified: Secondary | ICD-10-CM

## 2013-09-10 DIAGNOSIS — I359 Nonrheumatic aortic valve disorder, unspecified: Secondary | ICD-10-CM

## 2013-09-10 DIAGNOSIS — Z7901 Long term (current) use of anticoagulants: Secondary | ICD-10-CM

## 2013-09-10 DIAGNOSIS — N3281 Overactive bladder: Secondary | ICD-10-CM

## 2013-09-10 DIAGNOSIS — Z954 Presence of other heart-valve replacement: Secondary | ICD-10-CM

## 2013-09-10 DIAGNOSIS — Z952 Presence of prosthetic heart valve: Secondary | ICD-10-CM

## 2013-09-10 LAB — POCT INR: INR: 2.6

## 2013-09-10 MED ORDER — METHYLPREDNISOLONE 4 MG PO KIT: PACK | ORAL | Status: AC

## 2013-09-10 MED ORDER — SOLIFENACIN SUCCINATE 5 MG PO TABS: 5.0000 mg | ORAL_TABLET | Freq: Every day | ORAL | Status: DC

## 2013-09-10 MED ORDER — CELECOXIB 200 MG PO CAPS: 200.0000 mg | ORAL_CAPSULE | Freq: Every day | ORAL | Status: DC

## 2013-09-10 NOTE — Patient Instructions (Signed)
Anticoagulation Dose Instructions as of 09/10/2013     Madison [name] Tue Wed Thu Fri Sat   New Dose 5 mg 5 mg 2.5 mg 5 mg 5 mg 2.5 mg 5 mg    Description       Take 1 tablet all days except take 1/2 tablet on Tues/Fri.  Re-check in 10 days

## 2013-09-15 ENCOUNTER — Encounter: Payer: Self-pay | Admitting: Family

## 2013-09-15 ENCOUNTER — Ambulatory Visit (INDEPENDENT_AMBULATORY_CARE_PROVIDER_SITE_OTHER): Payer: PRIVATE HEALTH INSURANCE | Admitting: Family

## 2013-09-15 VITALS — HR 94 | Temp 98.2°F | Wt 138.0 lb

## 2013-09-15 DIAGNOSIS — Z7901 Long term (current) use of anticoagulants: Secondary | ICD-10-CM

## 2013-09-15 DIAGNOSIS — Z954 Presence of other heart-valve replacement: Secondary | ICD-10-CM

## 2013-09-15 DIAGNOSIS — T887XXA Unspecified adverse effect of drug or medicament, initial encounter: Secondary | ICD-10-CM

## 2013-09-15 DIAGNOSIS — T50905A Adverse effect of unspecified drugs, medicaments and biological substances, initial encounter: Secondary | ICD-10-CM

## 2013-09-15 DIAGNOSIS — I359 Nonrheumatic aortic valve disorder, unspecified: Secondary | ICD-10-CM

## 2013-09-15 DIAGNOSIS — Z952 Presence of prosthetic heart valve: Secondary | ICD-10-CM

## 2013-09-15 LAB — POCT INR: INR: 3.5

## 2013-09-15 MED ORDER — METHYLPREDNISOLONE ACETATE 40 MG/ML IJ SUSP
80.0000 mg | Freq: Once | INTRAMUSCULAR | Status: AC
  Administered 2013-09-15: 80 mg via INTRAMUSCULAR

## 2013-09-15 MED ORDER — TRAMADOL HCL 50 MG PO TABS: 50.0000 mg | ORAL_TABLET | Freq: Three times a day (TID) | ORAL | Status: DC | PRN

## 2013-09-15 NOTE — Patient Instructions (Addendum)
Hold Coumadin today only. Discontinue Celebrex and Vesicare. Then Continue taking 1 tablet all days except take 1/2 tablet on Tues/Fri.  Re-check  In 2 weeks.   Anticoagulation Dose Instructions as of 09/15/2013     Glynis Smiles Tue Wed Thu Fri Sat   New Dose 5 mg 5 mg 2.5 mg 5 mg 5 mg 2.5 mg 5 mg    Description       Hold Coumadin today only. Discontinue Celebrex and Vesicare. Then Continue taking 1 tablet all days except take 1/2 tablet on Tues/Fri.  Re-check  In 2 weeks.

## 2013-09-15 NOTE — Progress Notes (Signed)
  Subjective:    Patient ID: Madison [name], female    DOB: 01-Jun-1961, 52 y.o.   MRN: [id number]  HPI 52 year old white female, in today with a persistent rash. She was seen on 09/04/2013 by Dr. Caryl Never and was thought to be a contact dermatitis and prescribed triamcinolone cream that was ineffective. She was seen on 08/06/2013 and began Decadron LA and Celebrex. On 08/27/2013, Detrol was discontinued and VESIcare was initiated due to the cost. The rash began shortly after starting VESIcare. However, she's also been taking Celebrex over the last month. He describes the rash is raised, burning, and spreading.   Review of Systems  Constitutional: Negative.   Respiratory: Negative.   Cardiovascular: Negative.   Gastrointestinal: Negative.   Endocrine: Negative.   Musculoskeletal: Negative.   Skin: Positive for color change and rash.       Red, burning rash in the chest, neck, antecubital areas of the arms bilaterally  Neurological: Negative.   Psychiatric/Behavioral: Negative.        Objective:   Physical Exam  Constitutional: She is oriented to person, place, and time. She appears well-developed and well-nourished.  Neck: Normal range of motion. Neck supple.  Cardiovascular: Normal rate, regular rhythm and normal heart sounds.   Pulmonary/Chest: Effort normal and breath sounds normal.  Neurological: She is alert and oriented to person, place, and time.  Skin: Rash noted. There is erythema.  The rash significantly worse. Erythematous, dry, scaly and flaky rash noted to the neck anteriorly extending to the chest and lower abdomen. Tender to touch. No active drainage or discharge. Similar rash also noted to the antecubitals area that is now spreading proximally and distally  Psychiatric: She has a normal mood and affect.          Assessment & Plan:  Assessment: 1. Contact dermatitis as worsening 2. Allergic reaction to medication  Plan: Discontinue Vesicare and Celebrex until we  decide which is causing a rash. Depo-Medrol 80 mg IM x1 given. Follow up patient in 2 days sooner if needed.

## 2013-09-18 ENCOUNTER — Telehealth: Payer: Self-pay | Admitting: Family

## 2013-09-18 MED ORDER — TRIAMCINOLONE ACETONIDE 0.1 % EX CREA: TOPICAL_CREAM | Freq: Two times a day (BID) | CUTANEOUS | Status: DC

## 2013-09-18 NOTE — Telephone Encounter (Signed)
Pt needs refill on triamcinolone cream call into [facility]

## 2013-09-18 NOTE — Telephone Encounter (Signed)
Done

## 2013-09-21 ENCOUNTER — Ambulatory Visit: Payer: PRIVATE HEALTH INSURANCE

## 2013-09-22 ENCOUNTER — Telehealth: Payer: Self-pay | Admitting: Family

## 2013-09-22 NOTE — Telephone Encounter (Signed)
Pt has ordered diabetic shoes and would like you to know there will be fax coming.

## 2013-09-22 NOTE — Telephone Encounter (Signed)
Noted  

## 2013-09-28 ENCOUNTER — Encounter: Payer: Self-pay | Admitting: Family

## 2013-09-28 ENCOUNTER — Ambulatory Visit (INDEPENDENT_AMBULATORY_CARE_PROVIDER_SITE_OTHER): Payer: Medicare Other | Admitting: Family

## 2013-09-28 VITALS — BP 102/70 | Temp 98.5°F | Wt 138.0 lb

## 2013-09-28 DIAGNOSIS — E119 Type 2 diabetes mellitus without complications: Secondary | ICD-10-CM

## 2013-09-28 DIAGNOSIS — T50904A Poisoning by unspecified drugs, medicaments and biological substances, undetermined, initial encounter: Secondary | ICD-10-CM

## 2013-09-28 DIAGNOSIS — L270 Generalized skin eruption due to drugs and medicaments taken internally: Secondary | ICD-10-CM

## 2013-09-28 MED ORDER — TRIAMCINOLONE ACETONIDE 0.1 % EX CREA: TOPICAL_CREAM | Freq: Two times a day (BID) | CUTANEOUS | Status: DC

## 2013-09-28 MED ORDER — PREDNISONE 20 MG PO TABS: 40.0000 mg | ORAL_TABLET | Freq: Every day | ORAL | Status: AC

## 2013-09-28 MED ORDER — HYDROCODONE-ACETAMINOPHEN 10-325 MG PO TABS: 1.0000 | ORAL_TABLET | Freq: Three times a day (TID) | ORAL | Status: DC | PRN

## 2013-09-28 MED ORDER — METHYLPREDNISOLONE ACETATE 40 MG/ML IJ SUSP
40.0000 mg | Freq: Once | INTRAMUSCULAR | Status: AC
  Administered 2013-09-28: 40 mg via INTRAMUSCULAR

## 2013-09-28 NOTE — Progress Notes (Signed)
Pre visit review using our clinic review tool, if applicable. No additional management support is needed unless otherwise documented below in the visit note. 

## 2013-09-28 NOTE — Progress Notes (Signed)
Subjective:    Patient ID: Madison [name], female    DOB: 11-Jan-1961, 52 y.o.   MRN: [id number]  HPI  52 year old white female, nonsmoker is in with a return of a rash. At her last office visit, we discontinue Celebrex and VESIcare. We started triamcinolone cream and the rash improved. Over the weekend, patient reports she discontinue the triamcinolone cream and the rash returned. She describes it as a burning sensation, over her torso, arms and lower legs. Denies any changes in detergents, soaps, lotions, new foods or medications.  Review of Systems  Constitutional: Negative.   Respiratory: Negative.   Cardiovascular: Negative.   Gastrointestinal: Negative.   Musculoskeletal: Negative.   Skin: Positive for rash.       Red, excoriated rash noted to the torso, arms, legs anteriority. Painful to touch.   Hematological: Negative.   Psychiatric/Behavioral: Negative.    Past Medical History  Diagnosis Date  . CAD (coronary artery disease)   . Obesity   . Hypercholesteremia   . HTN (hypertension)   . Dyslipidemia   . Stroke syndrome   . Aortic stenosis   . Thrombophlebitis   . Diabetes   . Hyperlipidemia   . Rheumatoid arthritis(714.0)     History   Social History  . Marital Status: Single    Spouse Name: N/A    Number of Children: N/A  . Years of Education: N/A   Occupational History  . Not on file.   Social History Main Topics  . Smoking status: Never Smoker   . Smokeless tobacco: Not on file  . Alcohol Use: Not on file  . Drug Use: Not on file  . Sexual Activity: Not on file   Other Topics Concern  . Not on file   Social History Narrative  . No narrative on file    Past Surgical History  Procedure Laterality Date  . Cesarean section    . Foot fracture surgery    . Knee surgery    . Neuroplasty / transposition median nerve at carpal tunnel      Family History  Problem Relation Age of Onset  . Stroke      No Known Allergies  Current Outpatient  Prescriptions on File Prior to Visit  Medication Sig Dispense Refill  . aspirin 81 MG tablet Take 81 mg by mouth daily.      . baclofen (LIORESAL) 10 MG tablet Take 10 mg by mouth every morning.      . citalopram (CELEXA) 40 MG tablet Take 1 tablet (40 mg total) by mouth daily.  90 tablet  1  . Insulin Glargine (LANTUS SOLOSTAR) 100 UNIT/ML SOPN Inject 5 Units into the skin at bedtime.  5 pen  PRN  . insulin lispro (HUMALOG) 100 UNIT/ML SOPN 3 times a day (just before each meal) 15-10-15 units, and pen needles 4/day      . pravastatin (PRAVACHOL) 20 MG tablet Take 20 mg by mouth daily.      . ranitidine (ZANTAC) 150 MG capsule Take 150 mg by mouth 2 (two) times daily.      . traMADol (ULTRAM) 50 MG tablet Take 1-2 tablets (50-100 mg total) by mouth every 8 (eight) hours as needed for pain.  30 tablet  0  . warfarin (COUMADIN) 5 MG tablet Take 1 tablet (5 mg total) by mouth daily.  45 tablet  3   No current facility-administered medications on file prior to visit.    BP 102/70  Temp(Src) 98.5 F (36.9 C) (  Oral)  Wt 138 lb (62.596 kg)chart    Objective:   Physical Exam        Assessment & Plan:  Assessment: 1. Allergic reaction to drug 2. Dermatitis  Plan:  after consult with Dr. Tawanna Cooler, decided to prescribe prednisone 40 mg until she comes back to see me Friday. At that time we'll start tapering her down 20 mg every 3 days, 10 mg x3 days then every other day. We'll follow up Friday.

## 2013-09-28 NOTE — Patient Instructions (Addendum)
40mg  a day until you come back on Friday.   Drug Allergy Allergic reactions to medicines are common. Some allergic reactions are mild. A delayed type of drug allergy that occurs 1 week or more after exposure to a medicine or vaccine is called serum sickness. A life-threatening, sudden (acute) allergic reaction that involves the whole body is called anaphylaxis. CAUSES  "True" drug allergies occur when there is an allergic reaction to a medicine. This is caused by overactivity of the immune system. First, the body becomes sensitized. The immune system is triggered by your first exposure to the medicine. Following this first exposure, future exposure to the same medicine may be life-threatening. Almost any medicine can cause an allergic reaction. Common ones are:  Penicillin.  Sulfonamides (sulfa drugs).  Local anesthetics.  X-ray dyes that contain iodine. SYMPTOMS  Common symptoms of a minor allergic reaction are:  Swelling around the mouth.  An itchy red rash or hives.  Vomiting or diarrhea. Anaphylaxis can cause swelling of the mouth and throat. This makes it difficult to breathe and swallow. Severe reactions can be fatal within seconds, even after exposure to only a trace amount of the drug that causes the reaction. HOME CARE INSTRUCTIONS   If you are unsure of what caused your reaction, keep a diary of foods and medicines used. Include the symptoms that followed. Avoid anything that causes reactions.  You may want to follow up with an allergy specialist after the reaction has cleared in order to be tested to confirm the allergy. It is important to confirm that your reaction is an allergy, not just a side effect to the medicine. If you have a true allergy to a medicine, this may prevent that medicine and related medicines from being given to you when you are very ill.  If you have hives or a rash:  Take medicines as directed by your caregiver.  You may use an over-the-counter  antihistamine (diphenhydramine) as needed.  Apply cold compresses to the skin or take baths in cool water. Avoid hot baths or showers.  If you are severely allergic:  Continuous observation after a severe reaction may be needed. Hospitalization is often required.  Wear a medical alert bracelet or necklace stating your allergy.  You and your family must learn how to use an anaphylaxis kit or give an epinephrine injection to temporarily treat an emergency allergic reaction. If you have had a severe reaction, always carry your epinephrine injection or anaphylaxis kit with you. This can be lifesaving if you have a severe reaction.  Do not drive or perform tasks after treatment until the medicines used to treat your reaction have worn off, or until your caregiver says it is okay. SEEK MEDICAL CARE IF:   You think you had an allergic reaction. Symptoms usually start within 30 minutes after exposure.  Symptoms are getting worse rather than better.  You develop new symptoms.  The symptoms that brought you to your caregiver return. SEEK IMMEDIATE MEDICAL CARE IF:   You have swelling of the mouth, difficulty breathing, or wheezing.  You have a tight feeling in your chest or throat.  You develop hives, swelling, or itching all over your body.  You develop severe vomiting or diarrhea.  You feel faint or pass out. This is an emergency. Use your epinephrine injection or anaphylaxis kit as you have been instructed. Call for emergency medical help. Even if you improve after the injection, you need to be examined at a hospital emergency department.  MAKE SURE YOU:   Understand these instructions.  Will watch your condition.  Will get help right away if you are not doing well or get worse. Document Released: 11/05/2005 Document Revised: 01/28/2012 Document Reviewed: 04/11/2011 [facility] Patient Information 2014 [place], [place].

## 2013-09-30 ENCOUNTER — Encounter: Payer: Self-pay | Admitting: Endocrinology

## 2013-09-30 ENCOUNTER — Ambulatory Visit (INDEPENDENT_AMBULATORY_CARE_PROVIDER_SITE_OTHER): Payer: PRIVATE HEALTH INSURANCE | Admitting: Endocrinology

## 2013-09-30 VITALS — BP 108/48 | HR 40 | Temp 98.0°F | Ht 62.0 in | Wt 136.3 lb

## 2013-09-30 DIAGNOSIS — M25579 Pain in unspecified ankle and joints of unspecified foot: Secondary | ICD-10-CM

## 2013-09-30 NOTE — Progress Notes (Signed)
Subjective:    Patient ID: Madison [name], female    DOB: 03-31-1961, 52 y.o.   MRN: [id number]  HPI pt returns for f/u of insulin-requiring DM  (dx'ed 1981, on a routine blood test; she has moderate neuropathy of the lower extremities; she has associated retinopathy, CVA's, and CAD; she has been on insulin since dx; she has never had severe hypoglycemia; last episode of DKA was in 2013, due to pump malfunction; she was on insulin pump rx until CVA's prevented her from using the device in early 2014; she declined to dose mealtime insulin based on CHO counting).  She was recently rx'ed prednisone, for a rash.   Prior to the prednisone, cbg's were well-controlled.  It was lowest in the afternoon, and highest at hs.  She has chronic foot pain, present at rest, but worse with walking.   Past Medical History  Diagnosis Date  . CAD (coronary artery disease)   . Obesity   . Hypercholesteremia   . HTN (hypertension)   . Dyslipidemia   . Stroke syndrome   . Aortic stenosis   . Thrombophlebitis   . Diabetes   . Hyperlipidemia   . Rheumatoid arthritis(714.0)     Past Surgical History  Procedure Laterality Date  . Cesarean section    . Foot fracture surgery    . Knee surgery    . Neuroplasty / transposition median nerve at carpal tunnel      History   Social History  . Marital Status: Single    Spouse Name: N/A    Number of Children: N/A  . Years of Education: N/A   Occupational History  . Not on file.   Social History Main Topics  . Smoking status: Never Smoker   . Smokeless tobacco: Not on file  . Alcohol Use: Not on file  . Drug Use: Not on file  . Sexual Activity: Not on file   Other Topics Concern  . Not on file   Social History Narrative  . No narrative on file    Current Outpatient Prescriptions on File Prior to Visit  Medication Sig Dispense Refill  . aspirin 81 MG tablet Take 81 mg by mouth daily.      . baclofen (LIORESAL) 10 MG tablet Take 10 mg by mouth every  morning.      . citalopram (CELEXA) 40 MG tablet Take 1 tablet (40 mg total) by mouth daily.  90 tablet  1  . HYDROcodone-acetaminophen (NORCO) 10-325 MG per tablet Take 1 tablet by mouth every 8 (eight) hours as needed.  30 tablet  0  . Insulin Glargine (LANTUS SOLOSTAR) 100 UNIT/ML SOPN Inject 5 Units into the skin at bedtime.  5 pen  PRN  . insulin lispro (HUMALOG) 100 UNIT/ML SOPN 3 times a day (just before each meal) 15-5-20 units, and pen needles 4/day      . pravastatin (PRAVACHOL) 20 MG tablet Take 20 mg by mouth daily.      . predniSONE (DELTASONE) 20 MG tablet Take 2 tablets (40 mg total) by mouth daily.  60 tablet  0  . ranitidine (ZANTAC) 150 MG capsule Take 150 mg by mouth 2 (two) times daily.      Marland Kitchen warfarin (COUMADIN) 5 MG tablet Take 1 tablet (5 mg total) by mouth daily.  45 tablet  3   No current facility-administered medications on file prior to visit.   Allergies  Allergen Reactions  . Celebrex [Celecoxib] Rash   Family History  Problem Relation  Age of Onset  . Stroke     BP 108/48  Pulse 40  Temp(Src) 98 F (36.7 C) (Oral)  Ht 5\' 2"  (1.575 m)  Wt 136 lb 4.8 oz (61.825 kg)  BMI 24.92 kg/m2  Review of Systems denies hypoglycemia and weight change    Objective:   Physical Exam VITAL SIGNS:  See vs page GENERAL: no distress  Lab Results  Component Value Date   HGBA1C 11.8* 07/31/2013      Assessment & Plan:  DM: Based on the pattern of her cbg's, she needs some adjustment in her therapy Foot pain.  Pt believes this is due to arterial insufficiency. Rash: prednisone complicates there x of DM

## 2013-09-30 NOTE — Patient Instructions (Addendum)
check your blood sugar 4 times a day: before the 3 meals, and at bedtime.  also check if you have symptoms of your blood sugar being too high or too low.  please keep a record of the readings and bring it to your next appointment here.  You can write it on any piece of paper.  please call us sooner if your blood sugar goes below 70, or if you have a lot of readings over 200. please continue lantus, 5 units at bedtime.   Also, please reduce novolog to 3 times a day (just before each meal), 15-5-20 units.  While on the prednisone, take an extra 5 units of novolog, for any blood sugar over 200. Please come back for a follow-up appointment in January.   Let's check the circulation.  you will receive a phone call, about a day and time for an appointment.

## 2013-10-01 ENCOUNTER — Emergency Department (HOSPITAL_COMMUNITY)
Admission: EM | Admit: 2013-10-01 | Discharge: 2013-10-01 | Disposition: A | Discharge status: Home / Self Care | Payer: PRIVATE HEALTH INSURANCE | Attending: Emergency Medicine | Admitting: Emergency Medicine

## 2013-10-01 ENCOUNTER — Encounter (HOSPITAL_COMMUNITY): Payer: Self-pay | Admitting: Emergency Medicine

## 2013-10-01 ENCOUNTER — Ambulatory Visit: Payer: PRIVATE HEALTH INSURANCE

## 2013-10-01 ENCOUNTER — Encounter: Payer: Self-pay | Admitting: Family

## 2013-10-01 DIAGNOSIS — IMO0002 Reserved for concepts with insufficient information to code with codable children: Secondary | ICD-10-CM | POA: Insufficient documentation

## 2013-10-01 DIAGNOSIS — E785 Hyperlipidemia, unspecified: Secondary | ICD-10-CM | POA: Insufficient documentation

## 2013-10-01 DIAGNOSIS — I2510 Atherosclerotic heart disease of native coronary artery without angina pectoris: Secondary | ICD-10-CM | POA: Diagnosis not present

## 2013-10-01 DIAGNOSIS — I10 Essential (primary) hypertension: Secondary | ICD-10-CM | POA: Diagnosis not present

## 2013-10-01 DIAGNOSIS — E119 Type 2 diabetes mellitus without complications: Secondary | ICD-10-CM | POA: Insufficient documentation

## 2013-10-01 DIAGNOSIS — Z79899 Other long term (current) drug therapy: Secondary | ICD-10-CM | POA: Insufficient documentation

## 2013-10-01 DIAGNOSIS — Z7901 Long term (current) use of anticoagulants: Secondary | ICD-10-CM | POA: Insufficient documentation

## 2013-10-01 DIAGNOSIS — E7800 Pure hypercholesterolemia, unspecified: Secondary | ICD-10-CM | POA: Insufficient documentation

## 2013-10-01 DIAGNOSIS — E669 Obesity, unspecified: Secondary | ICD-10-CM | POA: Insufficient documentation

## 2013-10-01 DIAGNOSIS — Z7982 Long term (current) use of aspirin: Secondary | ICD-10-CM | POA: Diagnosis not present

## 2013-10-01 DIAGNOSIS — Z8673 Personal history of transient ischemic attack (TIA), and cerebral infarction without residual deficits: Secondary | ICD-10-CM | POA: Insufficient documentation

## 2013-10-01 DIAGNOSIS — R739 Hyperglycemia, unspecified: Secondary | ICD-10-CM

## 2013-10-01 DIAGNOSIS — Z794 Long term (current) use of insulin: Secondary | ICD-10-CM | POA: Insufficient documentation

## 2013-10-01 DIAGNOSIS — M069 Rheumatoid arthritis, unspecified: Secondary | ICD-10-CM | POA: Diagnosis not present

## 2013-10-01 LAB — POCT I-STAT, CHEM 8
BUN: 27 mg/dL — ABNORMAL HIGH (ref 6–23)
Calcium, Ion: 1.21 mmol/L (ref 1.12–1.23)
Chloride: 97 mEq/L (ref 96–112)
Creatinine, Ser: 1 mg/dL (ref 0.50–1.10)
Glucose, Bld: 301 mg/dL — ABNORMAL HIGH (ref 70–99)
HCT: 33 % — ABNORMAL LOW (ref 36.0–46.0)
Hemoglobin: 11.2 g/dL — ABNORMAL LOW (ref 12.0–15.0)
Potassium: 3.7 mEq/L (ref 3.5–5.1)

## 2013-10-01 LAB — BASIC METABOLIC PANEL
CO2: 18 mEq/L — ABNORMAL LOW (ref 19–32)
GFR calc Af Amer: 68 mL/min — ABNORMAL LOW (ref >90)
GFR calc non Af Amer: 59 mL/min — ABNORMAL LOW (ref >90)
Potassium: 3.8 mEq/L (ref 3.5–5.1)
Sodium: 128 mEq/L — ABNORMAL LOW (ref 135–145)

## 2013-10-01 LAB — URINALYSIS, ROUTINE W REFLEX MICROSCOPIC
Bilirubin Urine: NEGATIVE
Leukocytes, UA: NEGATIVE
Nitrite: NEGATIVE
Specific Gravity, Urine: 1.028 (ref 1.005–1.030)
Urobilinogen, UA: 0.2 mg/dL (ref 0.0–1.0)

## 2013-10-01 LAB — BLOOD GAS, VENOUS
Acid-base deficit: 8.1 mmol/L — ABNORMAL HIGH (ref 0.0–2.0)
Acid-base deficit: 0.8 mmol/L (ref 0.0–2.0)
FIO2: 0.21 %
O2 Saturation: 88.1 %
Patient temperature: 98.6
TCO2: 20.7 mmol/L (ref 0–100)
pCO2, Ven: 35.8 mmHg — ABNORMAL LOW (ref 45.0–50.0)
pH, Ven: 7.301 — ABNORMAL HIGH (ref 7.250–7.300)
pO2, Ven: 55.3 mmHg — ABNORMAL HIGH (ref 30.0–45.0)

## 2013-10-01 LAB — CBC WITH DIFFERENTIAL/PLATELET
Basophils Absolute: 0 10*3/uL (ref 0.0–0.1)
Basophils Relative: 0 % (ref 0–1)
Eosinophils Absolute: 0 10*3/uL (ref 0.0–0.7)
Eosinophils Relative: 0 % (ref 0–5)
HCT: 35 % — ABNORMAL LOW (ref 36.0–46.0)
Hemoglobin: 12.1 g/dL (ref 12.0–15.0)
Lymphocytes Relative: 10 % — ABNORMAL LOW (ref 12–46)
Lymphs Abs: 1.7 10*3/uL (ref 0.7–4.0)
MCH: 30.6 pg (ref 26.0–34.0)
MCV: 88.4 fL (ref 78.0–100.0)
Monocytes Absolute: 1 10*3/uL (ref 0.1–1.0)
Monocytes Relative: 6 % (ref 3–12)
Platelets: 409 10*3/uL — ABNORMAL HIGH (ref 150–400)
RBC: 3.96 MIL/uL (ref 3.87–5.11)
WBC: 16.2 10*3/uL — ABNORMAL HIGH (ref 4.0–10.5)

## 2013-10-01 MED ORDER — SODIUM CHLORIDE 0.9 % IV BOLUS (SEPSIS)
1000.0000 mL | Freq: Once | INTRAVENOUS | Status: AC
  Administered 2013-10-01: 1000 mL via INTRAVENOUS

## 2013-10-01 MED ORDER — INSULIN ASPART 100 UNIT/ML ~~LOC~~ SOLN
10.0000 [IU] | Freq: Once | SUBCUTANEOUS | Status: AC
  Administered 2013-10-01: 10 [IU] via SUBCUTANEOUS
  Filled 2013-10-01: qty 1

## 2013-10-01 NOTE — ED Notes (Signed)
Bed: RESA Expected date:  Expected time:  Means of arrival:  Comments: EMS-hypergylcemia

## 2013-10-01 NOTE — ED Provider Notes (Signed)
CSN: [id number]     Arrival date & time 10/01/13  1716 History   First MD Initiated Contact with Patient 10/01/13 1719     Chief Complaint  Patient presents with  . Hyperglycemia   (Consider location/radiation/quality/duration/timing/severity/associated sxs/prior Treatment) HPI Patient presents with concerns of hyperglycemia.  She has no physical complaints, denies confusion, disorientation, fever, chills. She states that she has had hyperglycemia in the last few days, attributed to recent topical medication for rash. She states that she no longer has any skin changes that she is concerned about. She has completed a course of medication. Today she was supposed to start new insulin regimen, but has not done so. She is concerned that her blood sugar is greater than 400.  Past Medical History  Diagnosis Date  . CAD (coronary artery disease)   . Obesity   . Hypercholesteremia   . HTN (hypertension)   . Dyslipidemia   . Stroke syndrome   . Aortic stenosis   . Thrombophlebitis   . Diabetes   . Hyperlipidemia   . Rheumatoid arthritis(714.0)    Past Surgical History  Procedure Laterality Date  . Cesarean section    . Foot fracture surgery    . Knee surgery    . Neuroplasty / transposition median nerve at carpal tunnel     Family History  Problem Relation Age of Onset  . Stroke     History  Substance Use Topics  . Smoking status: Never Smoker   . Smokeless tobacco: Not on file  . Alcohol Use: Not on file   OB History   Grav Para Term Preterm Abortions TAB SAB Ect Mult Living                 Review of Systems  Constitutional:       Per HPI, otherwise negative  HENT:       Per HPI, otherwise negative  Respiratory:       Per HPI, otherwise negative  Cardiovascular:       Per HPI, otherwise negative  Gastrointestinal: Negative for vomiting.  Endocrine:       Negative aside from HPI  Genitourinary:       Neg aside from HPI   Musculoskeletal:       Per HPI,  otherwise negative  Skin: Negative.   Neurological: Negative for syncope.    Allergies  Celebrex  Home Medications   Current Outpatient Rx  Name  Route  Sig  Dispense  Refill  . aspirin 81 MG tablet   Oral   Take 81 mg by mouth daily.         . baclofen (LIORESAL) 10 MG tablet   Oral   Take 10 mg by mouth every morning.         . citalopram (CELEXA) 40 MG tablet   Oral   Take 1 tablet (40 mg total) by mouth daily.   90 tablet   1   . HYDROcodone-acetaminophen (NORCO) 10-325 MG per tablet   Oral   Take 1 tablet by mouth every 8 (eight) hours as needed.   30 tablet   0   . Insulin Glargine (LANTUS SOLOSTAR) 100 UNIT/ML SOPN   Subcutaneous   Inject 5 Units into the skin at bedtime.   5 pen   PRN   . insulin lispro (HUMALOG) 100 UNIT/ML SOPN      3 times a day (just before each meal) 15-5-20 units, and pen needles 4/day         .  pravastatin (PRAVACHOL) 20 MG tablet   Oral   Take 20 mg by mouth daily.         . predniSONE (DELTASONE) 20 MG tablet   Oral   Take 2 tablets (40 mg total) by mouth daily.   60 tablet   0   . ranitidine (ZANTAC) 150 MG capsule   Oral   Take 150 mg by mouth 2 (two) times daily.         Marland Kitchen warfarin (COUMADIN) 5 MG tablet   Oral   Take 1 tablet (5 mg total) by mouth daily.   45 tablet   3    BP 125/63  Temp(Src) 98 F (36.7 C) (Oral)  Resp 20  SpO2 98% Physical Exam  Nursing note and vitals reviewed. Constitutional: She is oriented to person, place, and time. She appears well-developed and well-nourished. No distress.  HENT:  Head: Normocephalic and atraumatic.  Eyes: Conjunctivae and EOM are normal.  Cardiovascular: Normal rate and regular rhythm.   Pulmonary/Chest: Effort normal and breath sounds normal. No stridor. No respiratory distress.  Abdominal: She exhibits no distension.  Musculoskeletal: She exhibits no edema.  Neurological: She is alert and oriented to person, place, and time. No cranial nerve  deficit.  Skin: Skin is warm and dry.  Psychiatric: She has a normal mood and affect.    ED Course  Procedures (including critical care time) Labs Review Labs Reviewed  GLUCOSE, CAPILLARY - Abnormal; Notable for the following:    Glucose-Capillary 426 (*)    All other components within normal limits  CBC WITH DIFFERENTIAL  BASIC METABOLIC PANEL  URINALYSIS, ROUTINE W REFLEX MICROSCOPIC   Imaging Review No results found.  EKG Interpretation   None     O2- 99%ra, nml   Initial labs : Hyperglycemia Ketonuria Leukocytosis  Acidosis - all concerning for early DKA  10:38 PM Patient states that she feels better.  She has no new complaints.  Repeat labs demonstrated improving sugar level, no acidosis.  The patient's vital signs remain stable. MDM  No diagnosis found. This patient presents with concerns of hyperglycemia.  Patient appears generally well, has no physical complaints.  Patient's labs however, a notable initially for acidosis, ketonuria, hyperglycemia.  Patient's labs improved substantially, and clinically she states that she feels better as well.  With the patient's no increased insulin dosing regimen, she seems appropriate for outpatient management, followup.    Gerhard Munch, MD 10/01/13 2239

## 2013-10-01 NOTE — ED Notes (Signed)
Per EMS: BG of 562 per fire dept. Pt has been taking medications per norm. Endocrin appt yesterday, insulin doubled per DR and took of metformin about 3 weeks ago. First time this has happened to pt per pt, pt is on prednisone.

## 2013-10-01 NOTE — ED Notes (Signed)
Ambulated to restroom and back without difficulty  Pt ate 80% of sandwich  Tolerated well  No complaints voiced at this time

## 2013-10-01 NOTE — ED Notes (Signed)
Pt given turkey sandwich and diet ginger ale.   

## 2013-10-02 ENCOUNTER — Ambulatory Visit: Payer: PRIVATE HEALTH INSURANCE | Admitting: Family

## 2013-10-02 ENCOUNTER — Telehealth: Payer: Self-pay | Admitting: Family

## 2013-10-02 DIAGNOSIS — L259 Unspecified contact dermatitis, unspecified cause: Secondary | ICD-10-CM

## 2013-10-02 DIAGNOSIS — R21 Rash and other nonspecific skin eruption: Secondary | ICD-10-CM

## 2013-10-02 MED ORDER — TRIAMCINOLONE ACETONIDE 0.1 % EX CREA: TOPICAL_CREAM | Freq: Two times a day (BID) | CUTANEOUS | Status: DC

## 2013-10-02 NOTE — Telephone Encounter (Signed)
Per Padonda, pt does not need to come in today. Referral placed for dermatology and Rx for cream sent to pharmacy. Pt aware

## 2013-10-02 NOTE — Telephone Encounter (Signed)
Pt went to ed yesterday due to a med she was on. The prednisone made her sugar go up to 600. Pt has an appt at 3:30 today, but would like you to call her before then. Pt not sure if she needs to come in, since she was just at the ed. But refused to cancel until she spoke w/ you.

## 2013-10-06 ENCOUNTER — Encounter: Payer: Self-pay | Admitting: Family

## 2013-10-12 ENCOUNTER — Ambulatory Visit (INDEPENDENT_AMBULATORY_CARE_PROVIDER_SITE_OTHER): Payer: PRIVATE HEALTH INSURANCE | Admitting: Family

## 2013-10-12 DIAGNOSIS — Z952 Presence of prosthetic heart valve: Secondary | ICD-10-CM

## 2013-10-12 DIAGNOSIS — Z954 Presence of other heart-valve replacement: Secondary | ICD-10-CM

## 2013-10-12 DIAGNOSIS — Z7901 Long term (current) use of anticoagulants: Secondary | ICD-10-CM

## 2013-10-12 DIAGNOSIS — I359 Nonrheumatic aortic valve disorder, unspecified: Secondary | ICD-10-CM

## 2013-10-12 NOTE — Patient Instructions (Signed)
HOLD COUMADIN AND RECHECK ON Friday.   Anticoagulation Dose Instructions as of 10/12/2013     Glynis Smiles Tue Wed Thu Fri Sat   New Dose 5 mg 5 mg 2.5 mg 5 mg 5 mg 2.5 mg 5 mg    Description       HOLD COUMADIN AND RECHECK ON Friday.

## 2013-10-13 ENCOUNTER — Telehealth: Payer: Self-pay | Admitting: Family

## 2013-10-13 DIAGNOSIS — I639 Cerebral infarction, unspecified: Secondary | ICD-10-CM

## 2013-10-13 LAB — PROTIME-INR: Prothrombin Time: 100.5 s (ref 10.2–12.4)

## 2013-10-13 NOTE — Telephone Encounter (Signed)
Pt needs a referral for [facility] [phone] to help her with ADLS, cooking,laundry,housekeeping ,making beds etc.

## 2013-10-13 NOTE — Telephone Encounter (Signed)
Please advise 

## 2013-10-16 ENCOUNTER — Encounter (HOSPITAL_COMMUNITY): Payer: PRIVATE HEALTH INSURANCE

## 2013-10-19 ENCOUNTER — Ambulatory Visit (INDEPENDENT_AMBULATORY_CARE_PROVIDER_SITE_OTHER): Payer: PRIVATE HEALTH INSURANCE | Admitting: General Practice

## 2013-10-19 DIAGNOSIS — Z952 Presence of prosthetic heart valve: Secondary | ICD-10-CM

## 2013-10-19 DIAGNOSIS — Z954 Presence of other heart-valve replacement: Secondary | ICD-10-CM

## 2013-10-19 DIAGNOSIS — Z7901 Long term (current) use of anticoagulants: Secondary | ICD-10-CM

## 2013-10-19 DIAGNOSIS — I359 Nonrheumatic aortic valve disorder, unspecified: Secondary | ICD-10-CM

## 2013-10-20 ENCOUNTER — Ambulatory Visit (HOSPITAL_COMMUNITY): Payer: PRIVATE HEALTH INSURANCE | Attending: Endocrinology | Admitting: Cardiology

## 2013-10-20 DIAGNOSIS — I739 Peripheral vascular disease, unspecified: Secondary | ICD-10-CM

## 2013-10-20 DIAGNOSIS — I6350 Cerebral infarction due to unspecified occlusion or stenosis of unspecified cerebral artery: Secondary | ICD-10-CM | POA: Insufficient documentation

## 2013-10-21 ENCOUNTER — Telehealth: Payer: Self-pay | Admitting: Family

## 2013-10-21 ENCOUNTER — Other Ambulatory Visit: Payer: Self-pay | Admitting: Family

## 2013-10-21 ENCOUNTER — Telehealth: Payer: Self-pay | Admitting: Endocrinology

## 2013-10-21 DIAGNOSIS — I639 Cerebral infarction, unspecified: Secondary | ICD-10-CM

## 2013-10-21 NOTE — Telephone Encounter (Signed)
[facility] would like to know if you could send an order to [facility] also for her pcs services. OK to add to other order to [facility].  Previous order faxed earlier. Fax [phone]

## 2013-10-22 ENCOUNTER — Encounter: Payer: Self-pay | Admitting: *Deleted

## 2013-10-23 ENCOUNTER — Telehealth: Payer: Self-pay | Admitting: *Deleted

## 2013-10-23 ENCOUNTER — Encounter: Payer: Self-pay | Admitting: Family

## 2013-10-23 ENCOUNTER — Ambulatory Visit (INDEPENDENT_AMBULATORY_CARE_PROVIDER_SITE_OTHER): Payer: PRIVATE HEALTH INSURANCE | Admitting: Family

## 2013-10-23 VITALS — BP 110/58 | HR 81 | Wt 141.0 lb

## 2013-10-23 DIAGNOSIS — Z954 Presence of other heart-valve replacement: Secondary | ICD-10-CM

## 2013-10-23 DIAGNOSIS — I359 Nonrheumatic aortic valve disorder, unspecified: Secondary | ICD-10-CM

## 2013-10-23 DIAGNOSIS — Z7901 Long term (current) use of anticoagulants: Secondary | ICD-10-CM

## 2013-10-23 DIAGNOSIS — R609 Edema, unspecified: Secondary | ICD-10-CM

## 2013-10-23 DIAGNOSIS — Z952 Presence of prosthetic heart valve: Secondary | ICD-10-CM

## 2013-10-23 LAB — BASIC METABOLIC PANEL
CO2: 26 mEq/L (ref 19–32)
Chloride: 97 mEq/L (ref 96–112)
Creatinine, Ser: 1 mg/dL (ref 0.4–1.2)
GFR: 60.98 mL/min (ref >60.00)
Glucose, Bld: 615 mg/dL (ref 70–99)
Potassium: 5.9 mEq/L — ABNORMAL HIGH (ref 3.5–5.1)
Sodium: 132 mEq/L — ABNORMAL LOW (ref 135–145)

## 2013-10-23 LAB — POCT INR: INR: 4.9

## 2013-10-23 LAB — BRAIN NATRIURETIC PEPTIDE: Pro B Natriuretic peptide (BNP): 130 pg/mL — ABNORMAL HIGH (ref 0.0–100.0)

## 2013-10-23 MED ORDER — FUROSEMIDE 20 MG PO TABS: 20.0000 mg | ORAL_TABLET | Freq: Every day | ORAL | Status: DC

## 2013-10-23 NOTE — Patient Instructions (Addendum)
Peripheral Edema You have swelling in your legs (peripheral edema). This swelling is due to excess accumulation of salt and water in your body. Edema may be a sign of heart, kidney or liver disease, or a side effect of a medication. It may also be due to problems in the leg veins. Elevating your legs and using special support stockings may be very helpful, if the cause of the swelling is due to poor venous circulation. Avoid long periods of standing, whatever the cause. Treatment of edema depends on identifying the cause. Chips, pretzels, pickles and other salty foods should be avoided. Restricting salt in your diet is almost always needed. Water pills (diuretics) are often used to remove the excess salt and water from your body via urine. These medicines prevent the kidney from reabsorbing sodium. This increases urine flow. Diuretic treatment may also result in lowering of potassium levels in your body. Potassium supplements may be needed if you have to use diuretics daily. Daily weights can help you keep track of your progress in clearing your edema. You should call your caregiver for follow up care as recommended. SEEK IMMEDIATE MEDICAL CARE IF:   You have increased swelling, pain, redness, or heat in your legs.  You develop shortness of breath, especially when lying down.  You develop chest or abdominal pain, weakness, or fainting.  You have a fever. Document Released: 12/13/2004 Document Revised: 01/28/2012 Document Reviewed: 11/23/2009 [facility] Patient Information 2014 [place], [place].   Anticoagulation Dose Instructions as of 10/23/2013     Glynis Smiles Tue Wed Thu Fri Sat   New Dose 2.5 mg 5 mg 2.5 mg 5 mg 2.5 mg 5 mg 2.5 mg    Description       Hold Coumadin Saturday and Sunday.  Resume on Monday. 5mg  Monday, Wednesday, and Fridays 5mg . All other days 2.5mg  (1/2 tab). Recheck in 10 days.

## 2013-10-23 NOTE — Telephone Encounter (Signed)
Lab called with blood sugar of 615. Per Madison Campbell,np she need to go to ed.  Talked with pt and she states it was elevated because she had been here at the office at the time she was to take her insulin and when she got home she took her insulin and her blood sugar was 257.  She refused to go to hospital, instructed to continue to take blood sugar during the night and if it goes above 400, please go to hospital or if she becomes symptomatic go to ed and she asgreed

## 2013-10-23 NOTE — Progress Notes (Signed)
Subjective:    Patient ID: Madison [name], female    DOB: 1961-01-17, 52 y.o.   MRN: [id number]  HPI  52 year old white female, nonsmoker with a history of hypertension, coronary artery disease, hyperlipidemia, type 2 diabetes, CVA, chronic anticoagulation therapy on Coumadin, is in today with complaints of left lower extremity swelling x3 weeks. She is tried elevating the leg that helps temporarily. Denies any pain. She has been treated for contact dermatitis with prednisone over the last several weeks. She has an appointment with dermatology as the rash has returned since discontinuing prednisone. Denies any chest pain, palpitations, shortness of breath or edema.  Review of Systems  HENT: Negative.   Respiratory: Negative.   Cardiovascular: Positive for leg swelling. Negative for chest pain and palpitations.  Gastrointestinal: Negative.   Endocrine: Negative.   Genitourinary: Negative.   Musculoskeletal: Negative.   Skin: Positive for rash.       Dry, red, rash and lower extremities. No pain  Neurological: Negative.   Psychiatric/Behavioral: Negative.    Past Medical History  Diagnosis Date  . CAD (coronary artery disease)   . Obesity   . Hypercholesteremia   . HTN (hypertension)   . Dyslipidemia   . Stroke syndrome   . Aortic stenosis   . Thrombophlebitis   . Diabetes   . Hyperlipidemia   . Rheumatoid arthritis(714.0)     History   Social History  . Marital Status: Single    Spouse Name: N/A    Number of Children: N/A  . Years of Education: N/A   Occupational History  . Not on file.   Social History Main Topics  . Smoking status: Never Smoker   . Smokeless tobacco: Not on file  . Alcohol Use: Not on file  . Drug Use: Not on file  . Sexual Activity: Not on file   Other Topics Concern  . Not on file   Social History Narrative  . No narrative on file    Past Surgical History  Procedure Laterality Date  . Cesarean section    . Foot fracture surgery    .  Knee surgery    . Neuroplasty / transposition median nerve at carpal tunnel      Family History  Problem Relation Age of Onset  . Stroke      Allergies  Allergen Reactions  . Celebrex [Celecoxib] Rash    Current Outpatient Prescriptions on File Prior to Visit  Medication Sig Dispense Refill  . aspirin 81 MG tablet Take 81 mg by mouth daily.      . baclofen (LIORESAL) 10 MG tablet Take 10 mg by mouth every morning.      . citalopram (CELEXA) 40 MG tablet Take 1 tablet (40 mg total) by mouth daily.  90 tablet  1  . HYDROcodone-acetaminophen (NORCO) 10-325 MG per tablet Take 1 tablet by mouth every 8 (eight) hours as needed.  30 tablet  0  . Insulin Glargine (LANTUS SOLOSTAR) 100 UNIT/ML SOPN Inject 5 Units into the skin at bedtime.  5 pen  PRN  . insulin lispro (HUMALOG) 100 UNIT/ML SOPN 3 times a day (just before each meal) 15-5-20 units, and pen needles 4/day      . pravastatin (PRAVACHOL) 20 MG tablet Take 20 mg by mouth daily.      . ranitidine (ZANTAC) 150 MG capsule Take 150 mg by mouth 2 (two) times daily.      Marland Kitchen triamcinolone cream (KENALOG) 0.1 % Apply topically 2 (two) times daily.  30 g  1  . warfarin (COUMADIN) 5 MG tablet Take 1 tablet (5 mg total) by mouth daily.  45 tablet  3   No current facility-administered medications on file prior to visit.    BP 110/58  Pulse 81  Wt 141 lb (63.957 kg)chart    Objective:   Physical Exam  Constitutional: She is oriented to person, place, and time. She appears well-developed and well-nourished.  HENT:  Right Ear: External ear normal.  Left Ear: External ear normal.  Nose: Nose normal.  Mouth/Throat: Oropharynx is clear and moist.  Neck: Normal range of motion. Neck supple.  Cardiovascular: Normal rate, regular rhythm and normal heart sounds.   Pulmonary/Chest: Effort normal and breath sounds normal.  Abdominal: Soft. Bowel sounds are normal.  Musculoskeletal: Normal range of motion.  Neurological: She is alert and  oriented to person, place, and time.  Skin: Rash noted. There is erythema.  Psychiatric: She has a normal mood and affect.          Assessment & Plan:  Assessment: 1. Peripheral edema 2. Hyperlipidemia 3. Coronary artery disease 4. Type 2 diabetes 5. Contact Dermatitis   Plan: Temporarily Lasix 20 mg once daily times one week. BMP and BNP sent today. Notify patient of results. Elevate the legs. See dermatology as scheduled. I believe her edema may be related to prednisone use over the last 3 weeks. However, we'll attempt to pull fluid off and she has pitting edema.

## 2013-10-26 ENCOUNTER — Ambulatory Visit: Payer: PRIVATE HEALTH INSURANCE

## 2013-10-26 ENCOUNTER — Other Ambulatory Visit: Payer: Self-pay

## 2013-10-26 MED ORDER — PRAVASTATIN SODIUM 20 MG PO TABS: 20.0000 mg | ORAL_TABLET | Freq: Every day | ORAL | Status: DC

## 2013-10-26 MED ORDER — INSULIN LISPRO 100 UNIT/ML (KWIKPEN): PEN_INJECTOR | SUBCUTANEOUS | Status: DC

## 2013-10-26 MED ORDER — RANITIDINE HCL 150 MG PO CAPS: 150.0000 mg | ORAL_CAPSULE | Freq: Two times a day (BID) | ORAL | Status: DC

## 2013-10-26 MED ORDER — BACLOFEN 10 MG PO TABS: 10.0000 mg | ORAL_TABLET | Freq: Every morning | ORAL | Status: DC

## 2013-11-02 ENCOUNTER — Ambulatory Visit (INDEPENDENT_AMBULATORY_CARE_PROVIDER_SITE_OTHER): Payer: Medicare Other | Admitting: General Practice

## 2013-11-02 DIAGNOSIS — Z7901 Long term (current) use of anticoagulants: Secondary | ICD-10-CM

## 2013-11-02 DIAGNOSIS — I359 Nonrheumatic aortic valve disorder, unspecified: Secondary | ICD-10-CM

## 2013-11-02 DIAGNOSIS — Z954 Presence of other heart-valve replacement: Secondary | ICD-10-CM

## 2013-11-02 DIAGNOSIS — Z952 Presence of prosthetic heart valve: Secondary | ICD-10-CM

## 2013-11-02 LAB — POCT INR: INR: 2.4

## 2013-11-02 NOTE — Progress Notes (Signed)
Pre-visit discussion using our clinic review tool. No additional management support is needed unless otherwise documented below in the visit note.  

## 2013-11-18 ENCOUNTER — Telehealth: Payer: Self-pay | Admitting: Family

## 2013-11-18 NOTE — Telephone Encounter (Signed)
Pt states that pcp has completed paperwork in the past for her in order to receive more hours at work.  Pt would like for this to be completed again.  [email or web address] is the web site in which the form is located. Please advise.

## 2013-11-18 NOTE — Telephone Encounter (Signed)
Please advise pt to print documents and fax/mail/bring them to office for completion

## 2013-11-20 NOTE — Telephone Encounter (Signed)
Pt called and stated that she does not have access to a computer, or a printer. She's also unable to use one because of her condition.

## 2013-11-23 ENCOUNTER — Telehealth: Payer: Self-pay | Admitting: Family

## 2013-11-23 NOTE — Telephone Encounter (Signed)
Per Oran Rein, pt will need to find a way to get form printed and sent to the office

## 2013-11-23 NOTE — Telephone Encounter (Signed)
Pt states that she has found a company to supply her diabetic shoe:  [facility], phone: [phone] for a referral. Please advise.

## 2013-11-24 ENCOUNTER — Encounter: Payer: Self-pay | Admitting: Family

## 2013-11-24 NOTE — Telephone Encounter (Signed)
Left detailed message to advise pt that number provided is incorrect and that she will need to contact [facility] and have them fax the request to the office. Also advised pt that previous call for printing a form to be completed by PCP must be provided by the pt. We will not go online to print form for pt, per [facility]

## 2013-11-30 ENCOUNTER — Ambulatory Visit (INDEPENDENT_AMBULATORY_CARE_PROVIDER_SITE_OTHER): Payer: Medicare HMO | Admitting: Endocrinology

## 2013-11-30 ENCOUNTER — Ambulatory Visit (INDEPENDENT_AMBULATORY_CARE_PROVIDER_SITE_OTHER): Payer: Medicare HMO | Admitting: General Practice

## 2013-11-30 ENCOUNTER — Encounter: Payer: Self-pay | Admitting: Endocrinology

## 2013-11-30 VITALS — BP 116/66 | HR 91 | Temp 98.8°F | Ht 62.0 in | Wt 143.0 lb

## 2013-11-30 DIAGNOSIS — I359 Nonrheumatic aortic valve disorder, unspecified: Secondary | ICD-10-CM

## 2013-11-30 DIAGNOSIS — Z954 Presence of other heart-valve replacement: Secondary | ICD-10-CM

## 2013-11-30 DIAGNOSIS — Z7901 Long term (current) use of anticoagulants: Secondary | ICD-10-CM

## 2013-11-30 DIAGNOSIS — E1049 Type 1 diabetes mellitus with other diabetic neurological complication: Secondary | ICD-10-CM

## 2013-11-30 DIAGNOSIS — Z952 Presence of prosthetic heart valve: Secondary | ICD-10-CM

## 2013-11-30 DIAGNOSIS — E119 Type 2 diabetes mellitus without complications: Secondary | ICD-10-CM

## 2013-11-30 LAB — HEMOGLOBIN A1C: Hgb A1c MFr Bld: 10.2 % — ABNORMAL HIGH (ref 4.6–6.5)

## 2013-11-30 LAB — POCT INR: INR: 2.1

## 2013-11-30 MED ORDER — INSULIN ASPART PROT & ASPART (70-30 MIX) 100 UNIT/ML PEN: 40.0000 [IU] | PEN_INJECTOR | Freq: Every day | SUBCUTANEOUS | Status: DC

## 2013-11-30 MED ORDER — GLUCOSE BLOOD VI STRP
1.0000 | ORAL_STRIP | Freq: Four times a day (QID) | Status: DC
Start: 2013-11-30 — End: 2014-03-09

## 2013-11-30 NOTE — Progress Notes (Signed)
Subjective:    Patient ID: Madison [name], female    DOB: 06-17-1961, 53 y.o.   MRN: [id number]  HPI pt returns for f/u of insulin-requiring DM  (dx'ed 1981, on a routine blood test; she has moderate neuropathy of the lower extremities; she has associated retinopathy, CVA's, and CAD; she has been on insulin since dx; she has never had severe hypoglycemia; last episode of DKA was in 2013, due to pump malfunction; she was on insulin pump rx until CVA's prevented her from using the device in early 2014; she declined to dose mealtime insulin based on CHO counting).  no cbg record, but states cbg's are low approx once a month.  She says she takes a widely varying dosage of insulin, and cbg's also vary widely.  She has been off prednisone x 2 months.   Past Medical History  Diagnosis Date  . CAD (coronary artery disease)   . Obesity   . Hypercholesteremia   . HTN (hypertension)   . Dyslipidemia   . Stroke syndrome   . Aortic stenosis   . Thrombophlebitis   . Diabetes   . Hyperlipidemia   . Rheumatoid arthritis(714.0)     Past Surgical History  Procedure Laterality Date  . Cesarean section    . Foot fracture surgery    . Knee surgery    . Neuroplasty / transposition median nerve at carpal tunnel      History   Social History  . Marital Status: Single    Spouse Name: N/A    Number of Children: N/A  . Years of Education: N/A   Occupational History  . Not on file.   Social History Main Topics  . Smoking status: Never Smoker   . Smokeless tobacco: Not on file  . Alcohol Use: Not on file  . Drug Use: Not on file  . Sexual Activity: Not on file   Other Topics Concern  . Not on file   Social History Narrative  . No narrative on file    Current Outpatient Prescriptions on File Prior to Visit  Medication Sig Dispense Refill  . aspirin 81 MG tablet Take 81 mg by mouth daily.      . baclofen (LIORESAL) 10 MG tablet Take 1 tablet (10 mg total) by mouth every morning.  90 each  0    . citalopram (CELEXA) 40 MG tablet Take 1 tablet (40 mg total) by mouth daily.  90 tablet  1  . pravastatin (PRAVACHOL) 20 MG tablet Take 1 tablet (20 mg total) by mouth daily.  90 tablet  0  . ranitidine (ZANTAC) 150 MG capsule Take 1 capsule (150 mg total) by mouth 2 (two) times daily.  180 capsule  0  . warfarin (COUMADIN) 5 MG tablet Take 1 tablet (5 mg total) by mouth daily.  45 tablet  3  . furosemide (LASIX) 20 MG tablet Take 1 tablet (20 mg total) by mouth daily.  7 tablet  0  . HYDROcodone-acetaminophen (NORCO) 10-325 MG per tablet Take 1 tablet by mouth every 8 (eight) hours as needed.  30 tablet  0  . triamcinolone cream (KENALOG) 0.1 % Apply topically 2 (two) times daily.  30 g  1   No current facility-administered medications on file prior to visit.   Allergies  Allergen Reactions  . Celebrex [Celecoxib] Rash   Family History  Problem Relation Age of Onset  . Stroke     BP 116/66  Pulse 91  Temp(Src) 98.8 F (37.1  C) (Oral)  Ht 5\' 2"  (1.575 m)  Wt 143 lb (64.864 kg)  BMI 26.15 kg/m2  SpO2 97%  Review of Systems Denies LOC and weight change    Objective:   Physical Exam VITAL SIGNS:  See vs page GENERAL: no distress  Lab Results  Component Value Date   HGBA1C 10.2* 11/30/2013      Assessment & Plan:  DM: poor control.  She appears to need a simpler regimen.   CVA: this may be impairing her ability to handle the complexity of her insulin regimen.

## 2013-11-30 NOTE — Progress Notes (Signed)
Pre-visit discussion using our clinic review tool. No additional management support is needed unless otherwise documented below in the visit note.  

## 2013-11-30 NOTE — Patient Instructions (Addendum)
check your blood sugar 4 times a day: before the 3 meals, and at bedtime.  also check if you have symptoms of your blood sugar being too high or too low.  please keep a record of the readings and bring it to your next appointment here.  You can write it on any piece of paper.  please call us sooner if your blood sugar goes below 70, or if you have a lot of readings over 200.   Please come back for a follow-up appointment in 3 months. blood tests are being requested for you today.  We'll contact you with results.

## 2013-12-16 ENCOUNTER — Encounter: Payer: Self-pay | Admitting: Family

## 2013-12-16 ENCOUNTER — Ambulatory Visit (INDEPENDENT_AMBULATORY_CARE_PROVIDER_SITE_OTHER): Payer: Medicare HMO | Admitting: Family

## 2013-12-16 VITALS — BP 128/62 | HR 78 | Wt 153.0 lb

## 2013-12-16 DIAGNOSIS — E7800 Pure hypercholesterolemia, unspecified: Secondary | ICD-10-CM

## 2013-12-16 DIAGNOSIS — M069 Rheumatoid arthritis, unspecified: Secondary | ICD-10-CM

## 2013-12-16 DIAGNOSIS — I10 Essential (primary) hypertension: Secondary | ICD-10-CM

## 2013-12-16 DIAGNOSIS — I25810 Atherosclerosis of coronary artery bypass graft(s) without angina pectoris: Secondary | ICD-10-CM

## 2013-12-16 DIAGNOSIS — Z1211 Encounter for screening for malignant neoplasm of colon: Secondary | ICD-10-CM

## 2013-12-16 MED ORDER — BACLOFEN 10 MG PO TABS: 10.0000 mg | ORAL_TABLET | Freq: Every morning | ORAL | Status: DC

## 2013-12-16 MED ORDER — SOLIFENACIN SUCCINATE 5 MG PO TABS: ORAL_TABLET | ORAL | Status: DC

## 2013-12-16 MED ORDER — OMEPRAZOLE 40 MG PO CPDR: 40.0000 mg | DELAYED_RELEASE_CAPSULE | Freq: Every day | ORAL | Status: DC

## 2013-12-16 NOTE — Progress Notes (Signed)
Pre visit review using our clinic review tool, if applicable. No additional management support is needed unless otherwise documented below in the visit note. 

## 2013-12-16 NOTE — Progress Notes (Signed)
Subjective:    Patient ID: Madison [name], female    DOB: 14-Oct-1961, 53 y.o.   MRN: [id number]  HPI 53 y.o. White female who presents today for medication follow up. Pt has history of type 1 diabetes, CAD, stroke, HTN, hypercholestermia, dislipidemia, obesity, aortic stenosis, chronic anticoagulation, rheumatoid arthritis. Pt states that her zantac is not working well and she is having to take an additional dose at night. She also states that she is having "horrible pain" in her hands and wrist from arthritis. The pain is worse in the morning, it prevents her from doing activities such as opening doors or bottles, it is relieved when her "son rubs them", the pain is constant. She also states that she has been "wetting the bed" and that at night, she cannot get to the bathroom fast enough. Pt also acknowledges that she has been having low blood sugars, including one where she was 19, unconscious and EMS had to be called. She is following up with endocrinology.     Review of Systems  Constitutional: Positive for activity change.  HENT: Negative.   Eyes: Negative.   Respiratory: Negative.   Cardiovascular: Negative.   Gastrointestinal:       Heartburn  Endocrine: Positive for polyuria.  Genitourinary: Positive for urgency and enuresis.  Musculoskeletal: Positive for arthralgias and joint swelling.  Skin: Negative.   Allergic/Immunologic: Negative.   Neurological: Negative.   Hematological: Negative.   Psychiatric/Behavioral: Negative.        Past Medical History  Diagnosis Date  . CAD (coronary artery disease)   . Obesity   . Hypercholesteremia   . HTN (hypertension)   . Dyslipidemia   . Stroke syndrome   . Aortic stenosis   . Thrombophlebitis   . Diabetes   . Hyperlipidemia   . Rheumatoid arthritis(714.0)     History   Social History  . Marital Status: Single    Spouse Name: N/A    Number of Children: N/A  . Years of Education: N/A   Occupational History  . Not on  file.   Social History Main Topics  . Smoking status: Never Smoker   . Smokeless tobacco: Not on file  . Alcohol Use: Not on file  . Drug Use: Not on file  . Sexual Activity: Not on file   Other Topics Concern  . Not on file   Social History Narrative  . No narrative on file    Past Surgical History  Procedure Laterality Date  . Cesarean section    . Foot fracture surgery    . Knee surgery    . Neuroplasty / transposition median nerve at carpal tunnel      Family History  Problem Relation Age of Onset  . Stroke      Allergies  Allergen Reactions  . Celebrex [Celecoxib] Rash    Current Outpatient Prescriptions on File Prior to Visit  Medication Sig Dispense Refill  . aspirin 81 MG tablet Take 81 mg by mouth daily.      . citalopram (CELEXA) 40 MG tablet Take 1 tablet (40 mg total) by mouth daily.  90 tablet  1  . furosemide (LASIX) 20 MG tablet Take 1 tablet (20 mg total) by mouth daily.  7 tablet  0  . glucose blood (ONE TOUCH ULTRA TEST) test strip 1 each by Other route 4 (four) times daily. And lancets 4/day 250.01  360 each  12  . HYDROcodone-acetaminophen (NORCO) 10-325 MG per tablet Take 1 tablet by  mouth every 8 (eight) hours as needed.  30 tablet  0  . Insulin Aspart Prot & Aspart (NOVOLOG MIX 70/30 FLEXPEN) (70-30) 100 UNIT/ML Pen Inject 40 Units into the skin daily with breakfast. And pen needles 1/day  45 mL  11  . pravastatin (PRAVACHOL) 20 MG tablet Take 1 tablet (20 mg total) by mouth daily.  90 tablet  0  . triamcinolone cream (KENALOG) 0.1 % Apply topically 2 (two) times daily.  30 g  1  . warfarin (COUMADIN) 5 MG tablet Take 1 tablet (5 mg total) by mouth daily.  45 tablet  3   No current facility-administered medications on file prior to visit.    BP 128/62  Pulse 78  Wt 153 lb (69.4 kg)chart Objective:   Physical Exam  Constitutional: She is oriented to person, place, and time. She appears well-developed and well-nourished. She is active.    HENT:  Head: Normocephalic.  Cardiovascular: Normal rate, regular rhythm, normal heart sounds and normal pulses.   Pulmonary/Chest: Effort normal and breath sounds normal.  Abdominal: Soft. Normal appearance and bowel sounds are normal.  Musculoskeletal:  Grip strength is +1 bilaterally,   Neurological: She is alert and oriented to person, place, and time.  Skin: Skin is warm, dry and intact.  Psychiatric: She has a normal mood and affect. Her speech is normal and behavior is normal.          Assessment & Plan:  53 y.o. White female who presents today for a medication follow up. She has chief complaints of "hand and wrist pain", "increased urinary urgency" and "low blood sugars".  -Hand and wrist pain: will have patient consult to rheumatology for better control of her rheumatoid arthritis.  -Urinary Urgency: Will start vesicare 2.5mg  daily. Pt given strict instructions to stop taking medicine and call if she has any reactions including irritation or color change to skin.  -Heartburn: Due to frequency of heart burn and lack of control on Zantac--> start Omeprazole 40mg  Qday  -Low blood glucose: will continue to work with endocrinology.  -Education: Move wrist frequently through day to help keep range of motion and decrease pain. Continue with massaging for hands and wrist.   - Low blood sugars: signs and symptoms of lows, treatment, keep glucose near by at all times, follow up with endocrinology.   - Dyspepsia- Do not lay down after meals, eat smaller portions for meals.

## 2013-12-16 NOTE — Patient Instructions (Signed)
Overactive Bladder, Adult  The bladder has two functions that are totally opposite of the other. One is to relax and stretch out so it can store urine (fills like a balloon), and the other is to contract and squeeze down so that it can empty the urine that it has stored. Proper functioning of the bladder is a complex mixing of these two functions. The filling and emptying of the bladder can be influenced by:  · The bladder.  · The spinal cord.  · The brain.  · The nerves going to the bladder.  · Other organs that are closely related to the bladder such as prostate in males and the vagina in females.  As your bladder fills with urine, nerve signals are sent from the bladder to the brain to tell you that you may need to urinate. Normal urination requires that the bladder squeeze down with sufficient strength to empty the bladder, but this also requires that the bladder squeeze down sufficiently long to finish the job. In addition the sphincter muscles, which normally keep you from leaking urine, must also relax so that the urine can pass. Coordination between the bladder muscle squeezing down and the sphincter muscles relaxing is required to make everything happen normally.  With an overactive bladder sometimes the muscles of the bladder contract unexpectedly and involuntarily and this causes an urgent need to urinate. The normal response is to try to hold urine in by contracting the sphincter muscles. Sometimes the bladder contracts so strongly that the sphincter muscles cannot stop the urine from passing out and incontinence occurs. This kind of incontinence is called urge incontinence.  Having an overactive bladder can be embarrassing and awkward. It can keep you from living life the way you want to. Many people think it is just something you have to put up with as you grow older or have certain health conditions. In fact, there are treatments that can help make your life easier and more pleasant.  CAUSES   Many  things can cause an overactive bladder. Possibilities include:  · Urinary tract infection or infection of nearby tissues such as the prostate.  · Prostate enlargement.  · In women, multiple pregnancies or surgery on the uterus or urethra.  · Bladder stones, inflammation or tumors.  · Caffeine.  · Alcohol.  · Medications. For example, diuretics (drugs that help the body get rid of extra fluid) increase urine production. Some other medicines must be taken with lots of fluids.  · Muscle or nerve weakness. This might be the result of a spinal cord injury, a stroke, multiple sclerosis or Parkinson's disease.  · Diabetes can cause a high urine volume which fills the bladder so quickly that the normal urge to urinate is triggered very strongly.  SYMPTOMS   · Loss of bladder control. You feel the need to urinate and cannot make your body wait.  · Sudden, strong urges to urinate.  · Urinating 8 or more times a day.  · Waking up to urinate two or more times a night.  DIAGNOSIS   To decide if you have overactive bladder, your healthcare provider will probably:  · Ask about symptoms you have noticed.  · Ask about your overall health. This will include questions about any medications you are taking.  · Do a physical examination. This will help determine if there are obvious blockages or other problems.  · Order some tests. These might include:  · A blood test to check for diabetes or   other health issues that could be contributing to the problem.  · Urine testing. This could measure the flow of urine and the pressure on the bladder.  · A test of your neurological system (the brain, spinal cord and nerves). This is the system that senses the need to urinate. Some of these tests are called flow tests, bladder pressure tests and electrical measurements of the sphincter muscle.  · A bladder test to check whether it is emptying completely when you urinate.  · Cytoscopy. This test uses a thin tube with a tiny camera on it. It offers a  look inside your urethra and bladder to see if there are problems.  · Imaging tests. You might be given a contrast dye and then asked to urinate. X-rays are taken to see how your bladder is working.  TREATMENT   An overactive bladder can be treated in many ways. The treatment will depend on the cause. Whether you have a mild or severe case also makes a difference. Often, treatment can be given in your healthcare provider's office or clinic. Be sure to discuss the different options with your caregiver. They include:  · Behavioral treatments. These do not involve medication or surgery:  · Bladder training. For this, you would follow a schedule to urinate at regular intervals. This helps you learn to control the urge to urinate. At first, you might be asked to wait a few minutes after feeling the urge. In time, you should be able to schedule bathroom visits an hour or more apart.  · Kegel exercises. These exercises strengthen the pelvic floor muscles, which support the bladder. By toning these muscles, they can help control urination, even if the bladder muscles are overactive. A specialist will teach you how to do these exercises correctly. They will require daily practice.  · Weight loss. If you are obese or overweight, losing weight might stop your bladder from being overactive. Talk to your healthcare provider about how many pounds you should lose. Also ask if there is a specific program or method that would work best for you.  · Diet change. This might be suggested if constipation is making your overactive bladder worse. Your healthcare provider or a nutritionist can explain ways to change what you eat to ease constipation. Other people might need to take in less caffeine or alcohol. Sometimes drinking fewer fluids is needed, too.  · Protection. This is not an actual treatment. But, you could wear special pads to take care of any leakage while you wait for other treatments to take effect. This will help you avoid  embarrassment.  · Physical treatments.  · Electrical stimulation. Electrodes will send gentle pulses to the nerves or muscles that help control the bladder. The goal is to strengthen them. Sometimes this is done with the electrodes outside of the body. Or, they might be placed inside the body (implanted). This treatment can take several months to have an effect.  · Medications. These are usually used along with other treatments. Several medicines are available. Some are injected into the muscles involved in urination. Others come in pill form. Medications sometimes prescribed include:  · Anticholinergics. These drugs block the signals that the nerves deliver to the bladder. This keeps it from releasing urine at the wrong time. Researchers think the drugs might help in other ways, too.  · Imipramine. This is an antidepressant. But, it relaxes bladder muscles.  · Botox. This is still experimental. Some people believe that injecting it into the   bladder muscles will relax them so they work more normally. It has also been injected into the sphincter muscle when the sphincter muscle does not open properly. This is a temporary fix, however. Also, it might make matters worse, especially in older people.  · Surgery.  · A device might be implanted to help manage your nerves. It works on the nerves that signal when you need to urinate.  · Surgery is sometimes needed with electrical stimulation. If the electrodes are implanted, this is done through surgery.  · Sometimes repairs need to be made through surgery. For example, the size of the bladder can be changed. This is usually done in severe cases only.  HOME CARE INSTRUCTIONS   · Take any medications your healthcare provider prescribed or suggested. Follow the directions carefully.  · Practice any lifestyle changes that are recommended. These might include:  · Drinking less fluid or drinking at different times of the day. If you need to urinate often during the night, for  example, you may need to stop drinking fluids early in the evening.  · Cutting down on caffeine or alcohol. They can both make an overactive bladder worse. Caffeine is found in coffee, tea and sodas.  · Doing Kegel exercises to strengthen muscles.  · Losing weight, if that is recommended.  · Eating a healthy and balanced diet. This will help you avoid constipation.  · Keep a journal or a log. You might be asked to record how much you drink and when, and also when you feel the need to urinate.  · Learn how to care for implants or other devices, such as pessaries.  SEEK MEDICAL CARE IF:   · Your overactive bladder gets worse.  · You feel increased pain or irritation when you urinate.  · You notice blood in your urine.  · You have questions about any medications or devices that your healthcare provider recommended.  · You notice blood, pus or swelling at the site of any test or treatment procedure.  · You have an oral temperature above 102° F (38.9° C).  SEEK IMMEDIATE MEDICAL CARE IF:   You have an oral temperature above 102° F (38.9° C), not controlled by medicine.  Document Released: 09/01/2009 Document Revised: 01/28/2012 Document Reviewed: 09/01/2009  [employer]® Patient Information ©2014 [employer].

## 2013-12-23 ENCOUNTER — Telehealth: Payer: Self-pay | Admitting: Family

## 2013-12-23 NOTE — Telephone Encounter (Signed)
Relevant patient education mailed to patient.  

## 2013-12-24 ENCOUNTER — Telehealth: Payer: Self-pay | Admitting: Family

## 2013-12-24 DIAGNOSIS — E1142 Type 2 diabetes mellitus with diabetic polyneuropathy: Secondary | ICD-10-CM

## 2013-12-24 NOTE — Telephone Encounter (Signed)
done

## 2013-12-24 NOTE — Telephone Encounter (Signed)
Pt needs a referral to [facility] fax [phone] .Pt  has an appt on 01-04-14 to get dm shoes. Pt will bring her [employer] card on 12-31-14. Can we do referral?

## 2013-12-25 ENCOUNTER — Encounter: Payer: Self-pay | Admitting: Gastroenterology

## 2013-12-28 ENCOUNTER — Ambulatory Visit: Payer: Medicare Other

## 2013-12-28 ENCOUNTER — Ambulatory Visit: Payer: PRIVATE HEALTH INSURANCE | Admitting: Family

## 2013-12-31 ENCOUNTER — Ambulatory Visit (INDEPENDENT_AMBULATORY_CARE_PROVIDER_SITE_OTHER): Payer: Medicare HMO | Admitting: General Practice

## 2013-12-31 ENCOUNTER — Ambulatory Visit (INDEPENDENT_AMBULATORY_CARE_PROVIDER_SITE_OTHER): Payer: Medicare HMO | Admitting: Family

## 2013-12-31 ENCOUNTER — Encounter: Payer: Self-pay | Admitting: Family

## 2013-12-31 VITALS — BP 108/64 | HR 81 | Wt 153.0 lb

## 2013-12-31 DIAGNOSIS — Z7901 Long term (current) use of anticoagulants: Secondary | ICD-10-CM

## 2013-12-31 DIAGNOSIS — N318 Other neuromuscular dysfunction of bladder: Secondary | ICD-10-CM

## 2013-12-31 DIAGNOSIS — I359 Nonrheumatic aortic valve disorder, unspecified: Secondary | ICD-10-CM

## 2013-12-31 DIAGNOSIS — I350 Nonrheumatic aortic (valve) stenosis: Secondary | ICD-10-CM

## 2013-12-31 DIAGNOSIS — Z954 Presence of other heart-valve replacement: Secondary | ICD-10-CM

## 2013-12-31 DIAGNOSIS — Z952 Presence of prosthetic heart valve: Secondary | ICD-10-CM

## 2013-12-31 DIAGNOSIS — N3281 Overactive bladder: Secondary | ICD-10-CM

## 2013-12-31 DIAGNOSIS — Z5181 Encounter for therapeutic drug level monitoring: Secondary | ICD-10-CM | POA: Insufficient documentation

## 2013-12-31 DIAGNOSIS — M069 Rheumatoid arthritis, unspecified: Secondary | ICD-10-CM

## 2013-12-31 MED ORDER — SOLIFENACIN SUCCINATE 5 MG PO TABS: ORAL_TABLET | ORAL | Status: DC

## 2013-12-31 MED ORDER — OMEPRAZOLE 40 MG PO CPDR: 40.0000 mg | DELAYED_RELEASE_CAPSULE | Freq: Every day | ORAL | Status: DC

## 2013-12-31 NOTE — Progress Notes (Signed)
   Subjective:    Patient ID: Madison [name], female    DOB: [birth year], 53 y.o.   MRN: [id number]  HPI    Review of Systems     Objective:   Physical Exam        Assessment & Plan:  Alfie was seen today for follow-up.  Diagnoses and associated orders for this visit:  OAB (overactive bladder)  Rheumatoid arthritis(714.0)  Aortic stenosis  Long term (current) use of anticoagulants  Other Orders - omeprazole (PRILOSEC) 40 MG capsule; Take 1 capsule (40 mg total) by mouth daily. - solifenacin (VESICARE) 5 MG tablet; Take 1/2 tablet once daily   REcheck in 4-6 months. Sooner as needed. Keep appt with specialist.

## 2013-12-31 NOTE — Patient Instructions (Signed)
Overactive Bladder, Adult  The bladder has two functions that are totally opposite of the other. One is to relax and stretch out so it can store urine (fills like a balloon), and the other is to contract and squeeze down so that it can empty the urine that it has stored. Proper functioning of the bladder is a complex mixing of these two functions. The filling and emptying of the bladder can be influenced by:  · The bladder.  · The spinal cord.  · The brain.  · The nerves going to the bladder.  · Other organs that are closely related to the bladder such as prostate in males and the vagina in females.  As your bladder fills with urine, nerve signals are sent from the bladder to the brain to tell you that you may need to urinate. Normal urination requires that the bladder squeeze down with sufficient strength to empty the bladder, but this also requires that the bladder squeeze down sufficiently long to finish the job. In addition the sphincter muscles, which normally keep you from leaking urine, must also relax so that the urine can pass. Coordination between the bladder muscle squeezing down and the sphincter muscles relaxing is required to make everything happen normally.  With an overactive bladder sometimes the muscles of the bladder contract unexpectedly and involuntarily and this causes an urgent need to urinate. The normal response is to try to hold urine in by contracting the sphincter muscles. Sometimes the bladder contracts so strongly that the sphincter muscles cannot stop the urine from passing out and incontinence occurs. This kind of incontinence is called urge incontinence.  Having an overactive bladder can be embarrassing and awkward. It can keep you from living life the way you want to. Many people think it is just something you have to put up with as you grow older or have certain health conditions. In fact, there are treatments that can help make your life easier and more pleasant.  CAUSES   Many  things can cause an overactive bladder. Possibilities include:  · Urinary tract infection or infection of nearby tissues such as the prostate.  · Prostate enlargement.  · In women, multiple pregnancies or surgery on the uterus or urethra.  · Bladder stones, inflammation or tumors.  · Caffeine.  · Alcohol.  · Medications. For example, diuretics (drugs that help the body get rid of extra fluid) increase urine production. Some other medicines must be taken with lots of fluids.  · Muscle or nerve weakness. This might be the result of a spinal cord injury, a stroke, multiple sclerosis or Parkinson's disease.  · Diabetes can cause a high urine volume which fills the bladder so quickly that the normal urge to urinate is triggered very strongly.  SYMPTOMS   · Loss of bladder control. You feel the need to urinate and cannot make your body wait.  · Sudden, strong urges to urinate.  · Urinating 8 or more times a day.  · Waking up to urinate two or more times a night.  DIAGNOSIS   To decide if you have overactive bladder, your healthcare provider will probably:  · Ask about symptoms you have noticed.  · Ask about your overall health. This will include questions about any medications you are taking.  · Do a physical examination. This will help determine if there are obvious blockages or other problems.  · Order some tests. These might include:  · A blood test to check for diabetes or   other health issues that could be contributing to the problem.  · Urine testing. This could measure the flow of urine and the pressure on the bladder.  · A test of your neurological system (the brain, spinal cord and nerves). This is the system that senses the need to urinate. Some of these tests are called flow tests, bladder pressure tests and electrical measurements of the sphincter muscle.  · A bladder test to check whether it is emptying completely when you urinate.  · Cytoscopy. This test uses a thin tube with a tiny camera on it. It offers a  look inside your urethra and bladder to see if there are problems.  · Imaging tests. You might be given a contrast dye and then asked to urinate. X-rays are taken to see how your bladder is working.  TREATMENT   An overactive bladder can be treated in many ways. The treatment will depend on the cause. Whether you have a mild or severe case also makes a difference. Often, treatment can be given in your healthcare provider's office or clinic. Be sure to discuss the different options with your caregiver. They include:  · Behavioral treatments. These do not involve medication or surgery:  · Bladder training. For this, you would follow a schedule to urinate at regular intervals. This helps you learn to control the urge to urinate. At first, you might be asked to wait a few minutes after feeling the urge. In time, you should be able to schedule bathroom visits an hour or more apart.  · Kegel exercises. These exercises strengthen the pelvic floor muscles, which support the bladder. By toning these muscles, they can help control urination, even if the bladder muscles are overactive. A specialist will teach you how to do these exercises correctly. They will require daily practice.  · Weight loss. If you are obese or overweight, losing weight might stop your bladder from being overactive. Talk to your healthcare provider about how many pounds you should lose. Also ask if there is a specific program or method that would work best for you.  · Diet change. This might be suggested if constipation is making your overactive bladder worse. Your healthcare provider or a nutritionist can explain ways to change what you eat to ease constipation. Other people might need to take in less caffeine or alcohol. Sometimes drinking fewer fluids is needed, too.  · Protection. This is not an actual treatment. But, you could wear special pads to take care of any leakage while you wait for other treatments to take effect. This will help you avoid  embarrassment.  · Physical treatments.  · Electrical stimulation. Electrodes will send gentle pulses to the nerves or muscles that help control the bladder. The goal is to strengthen them. Sometimes this is done with the electrodes outside of the body. Or, they might be placed inside the body (implanted). This treatment can take several months to have an effect.  · Medications. These are usually used along with other treatments. Several medicines are available. Some are injected into the muscles involved in urination. Others come in pill form. Medications sometimes prescribed include:  · Anticholinergics. These drugs block the signals that the nerves deliver to the bladder. This keeps it from releasing urine at the wrong time. Researchers think the drugs might help in other ways, too.  · Imipramine. This is an antidepressant. But, it relaxes bladder muscles.  · Botox. This is still experimental. Some people believe that injecting it into the   bladder muscles will relax them so they work more normally. It has also been injected into the sphincter muscle when the sphincter muscle does not open properly. This is a temporary fix, however. Also, it might make matters worse, especially in older people.  · Surgery.  · A device might be implanted to help manage your nerves. It works on the nerves that signal when you need to urinate.  · Surgery is sometimes needed with electrical stimulation. If the electrodes are implanted, this is done through surgery.  · Sometimes repairs need to be made through surgery. For example, the size of the bladder can be changed. This is usually done in severe cases only.  HOME CARE INSTRUCTIONS   · Take any medications your healthcare provider prescribed or suggested. Follow the directions carefully.  · Practice any lifestyle changes that are recommended. These might include:  · Drinking less fluid or drinking at different times of the day. If you need to urinate often during the night, for  example, you may need to stop drinking fluids early in the evening.  · Cutting down on caffeine or alcohol. They can both make an overactive bladder worse. Caffeine is found in coffee, tea and sodas.  · Doing Kegel exercises to strengthen muscles.  · Losing weight, if that is recommended.  · Eating a healthy and balanced diet. This will help you avoid constipation.  · Keep a journal or a log. You might be asked to record how much you drink and when, and also when you feel the need to urinate.  · Learn how to care for implants or other devices, such as pessaries.  SEEK MEDICAL CARE IF:   · Your overactive bladder gets worse.  · You feel increased pain or irritation when you urinate.  · You notice blood in your urine.  · You have questions about any medications or devices that your healthcare provider recommended.  · You notice blood, pus or swelling at the site of any test or treatment procedure.  · You have an oral temperature above 102° F (38.9° C).  SEEK IMMEDIATE MEDICAL CARE IF:   You have an oral temperature above 102° F (38.9° C), not controlled by medicine.  Document Released: 09/01/2009 Document Revised: 01/28/2012 Document Reviewed: 09/01/2009  [employer]® Patient Information ©2014 [employer].

## 2013-12-31 NOTE — Progress Notes (Signed)
Pre visit review using our clinic review tool, if applicable. No additional management support is needed unless otherwise documented below in the visit note. 

## 2013-12-31 NOTE — Progress Notes (Signed)
Subjective:    Patient ID: Madison [name], female    DOB: November 02, 1961, 53 y.o.   MRN: [id number]  HPI  53 year old white female, nonsmoker, with a history of type 2 diabetes, hypertension, hyperlipidemia, rheumatoid arthritis, aortic stenosis, is in today for recheck of overactive bladder. Her last office visit, we started VESIcare 5 mg once daily. She is tolerating the medication well. Urinary frequency has improved significantly. She chronically takes Coumadin for an aortic stenosis. Scheduled to see rheumatology March 2 for management of rheumatoid arthritis.  Review of Systems  Constitutional: Negative.   Respiratory: Negative.   Cardiovascular: Negative.   Gastrointestinal: Negative.   Genitourinary: Positive for frequency. Negative for dysuria and urgency.  Musculoskeletal: Negative.   Skin: Negative.   Psychiatric/Behavioral: Negative.    Past Medical History  Diagnosis Date  . CAD (coronary artery disease)   . Obesity   . Hypercholesteremia   . HTN (hypertension)   . Dyslipidemia   . Stroke syndrome   . Aortic stenosis   . Thrombophlebitis   . Diabetes   . Hyperlipidemia   . Rheumatoid arthritis(714.0)     History   Social History  . Marital Status: Single    Spouse Name: N/A    Number of Children: N/A  . Years of Education: N/A   Occupational History  . Not on file.   Social History Main Topics  . Smoking status: Never Smoker   . Smokeless tobacco: Not on file  . Alcohol Use: Not on file  . Drug Use: Not on file  . Sexual Activity: Not on file   Other Topics Concern  . Not on file   Social History Narrative  . No narrative on file    Past Surgical History  Procedure Laterality Date  . Cesarean section    . Foot fracture surgery    . Knee surgery    . Neuroplasty / transposition median nerve at carpal tunnel      Family History  Problem Relation Age of Onset  . Stroke      Allergies  Allergen Reactions  . Celebrex [Celecoxib] Rash     Current Outpatient Prescriptions on File Prior to Visit  Medication Sig Dispense Refill  . aspirin 81 MG tablet Take 81 mg by mouth daily.      . baclofen (LIORESAL) 10 MG tablet Take 1 tablet (10 mg total) by mouth every morning.  90 each  0  . citalopram (CELEXA) 40 MG tablet Take 1 tablet (40 mg total) by mouth daily.  90 tablet  1  . furosemide (LASIX) 20 MG tablet Take 1 tablet (20 mg total) by mouth daily.  7 tablet  0  . glucose blood (ONE TOUCH ULTRA TEST) test strip 1 each by Other route 4 (four) times daily. And lancets 4/day 250.01  360 each  12  . HYDROcodone-acetaminophen (NORCO) 10-325 MG per tablet Take 1 tablet by mouth every 8 (eight) hours as needed.  30 tablet  0  . Insulin Aspart Prot & Aspart (NOVOLOG MIX 70/30 FLEXPEN) (70-30) 100 UNIT/ML Pen Inject 40 Units into the skin daily with breakfast. And pen needles 1/day  45 mL  11  . pravastatin (PRAVACHOL) 20 MG tablet Take 1 tablet (20 mg total) by mouth daily.  90 tablet  0  . triamcinolone cream (KENALOG) 0.1 % Apply topically 2 (two) times daily.  30 g  1  . warfarin (COUMADIN) 5 MG tablet Take 1 tablet (5 mg total) by mouth daily.  45 tablet  3   No current facility-administered medications on file prior to visit.    BP 108/64  Pulse 81  Wt 153 lb (69.4 kg)chart    Objective:   Physical Exam  Constitutional: She is oriented to person, place, and time. She appears well-developed and well-nourished.  Neck: Normal range of motion. Neck supple.  Cardiovascular: Normal rate, regular rhythm and normal heart sounds.   Pulmonary/Chest: Effort normal and breath sounds normal.  Abdominal: Soft. Bowel sounds are normal. She exhibits no distension. There is no tenderness. There is no rebound.  Musculoskeletal: Normal range of motion.  Neurological: She is alert and oriented to person, place, and time.  Skin: Skin is warm and dry.  Psychiatric: She has a normal mood and affect.          Assessment & Plan:

## 2014-01-01 ENCOUNTER — Telehealth: Payer: Self-pay | Admitting: Family

## 2014-01-01 NOTE — Telephone Encounter (Signed)
Pt is needing a referral for [facility], pt has appointment 01/04/14 at 10:00am. Pt states she left a card with the information on it to fax.

## 2014-01-04 ENCOUNTER — Ambulatory Visit: Payer: Medicare HMO | Admitting: Gastroenterology

## 2014-01-07 ENCOUNTER — Ambulatory Visit: Payer: Medicare HMO | Admitting: Gastroenterology

## 2014-01-08 ENCOUNTER — Telehealth: Payer: Self-pay | Admitting: Family

## 2014-01-08 NOTE — Telephone Encounter (Signed)
Please call pt new pharm [phone]. Pt does not know the name of pharm. Pt stated pharm has a list of all her medications she needs refills on.

## 2014-01-11 ENCOUNTER — Telehealth: Payer: Self-pay | Admitting: Family

## 2014-01-11 ENCOUNTER — Encounter: Payer: Self-pay | Admitting: Physician Assistant

## 2014-01-11 ENCOUNTER — Ambulatory Visit (INDEPENDENT_AMBULATORY_CARE_PROVIDER_SITE_OTHER): Payer: Medicare HMO | Admitting: Physician Assistant

## 2014-01-11 VITALS — BP 118/40 | HR 72 | Ht 63.0 in | Wt 155.0 lb

## 2014-01-11 DIAGNOSIS — Z7901 Long term (current) use of anticoagulants: Secondary | ICD-10-CM

## 2014-01-11 DIAGNOSIS — Z1211 Encounter for screening for malignant neoplasm of colon: Secondary | ICD-10-CM

## 2014-01-11 NOTE — Patient Instructions (Addendum)
Will will be faxing an order for the Cologuard test.for colonscopy screening.  You will get a package from [employer] and directions.  You may also get a phone call  From this company.  We will get results from them once you mail the speciman back to them and will call you with the results. This may take 3-4 weeks.

## 2014-01-11 NOTE — Telephone Encounter (Signed)
Left message to advise pt to have pharmacy send refill requests electronically or by fax.

## 2014-01-11 NOTE — Progress Notes (Addendum)
Subjective:    Patient ID: Madison [name], female    DOB: 08-09-1961, 53 y.o.   MRN: [id number]  HPI  Brendan is a pleasant 53 year old white female referred by primary care/Padonda  Orvan Falconer FNP for colon neoplasia screening. She is new to GI and has not had any prior screening. She does have multiple medical issues including history of coronary artery disease, rheumatoid arthritis, aortic stenosis status post aortic valve replacement in 2012 .She  has history of stroke prior to her valve replacement and after the valve replacement. Also an insulin-dependent diabetic. Patient has no family history of colon cancer or polyps that she is aware of. Has no current GI complaints -specifically no abdomiinal pain -changes in her bowel habits-melena or hematochezia.    Review of Systems  HENT: Negative.   Eyes: Negative.   Respiratory: Negative.   Cardiovascular: Negative.   Gastrointestinal: Negative.   Endocrine: Negative.   Genitourinary: Negative.   Musculoskeletal: Positive for arthralgias.  Skin: Negative.   Allergic/Immunologic: Negative.   Neurological: Positive for speech difficulty, weakness and numbness.  Hematological: Negative.   Psychiatric/Behavioral: Negative.    Outpatient Prescriptions Prior to Visit  Medication Sig Dispense Refill  . aspirin 81 MG tablet Take 81 mg by mouth daily.      . baclofen (LIORESAL) 10 MG tablet Take 1 tablet (10 mg total) by mouth every morning.  90 each  0  . citalopram (CELEXA) 40 MG tablet Take 1 tablet (40 mg total) by mouth daily.  90 tablet  1  . glucose blood (ONE TOUCH ULTRA TEST) test strip 1 each by Other route 4 (four) times daily. And lancets 4/day 250.01  360 each  12  . Insulin Aspart Prot & Aspart (NOVOLOG MIX 70/30 FLEXPEN) (70-30) 100 UNIT/ML Pen Inject 40 Units into the skin daily with breakfast. And pen needles 1/day  45 mL  11  . omeprazole (PRILOSEC) 40 MG capsule Take 1 capsule (40 mg total) by mouth daily.  90 capsule  3  .  pravastatin (PRAVACHOL) 20 MG tablet Take 1 tablet (20 mg total) by mouth daily.  90 tablet  0  . solifenacin (VESICARE) 5 MG tablet Take 1/2 tablet once daily  90 tablet  0  . triamcinolone cream (KENALOG) 0.1 % Apply topically 2 (two) times daily.  30 g  1  . warfarin (COUMADIN) 5 MG tablet Take 1 tablet (5 mg total) by mouth daily.  45 tablet  3  . furosemide (LASIX) 20 MG tablet Take 1 tablet (20 mg total) by mouth daily.  7 tablet  0  . HYDROcodone-acetaminophen (NORCO) 10-325 MG per tablet Take 1 tablet by mouth every 8 (eight) hours as needed.  30 tablet  0   No facility-administered medications prior to visit.   Allergies  Allergen Reactions  . Celebrex [Celecoxib] Rash   Patient Active Problem List   Diagnosis Date Noted  . Encounter for therapeutic drug monitoring 12/31/2013  . Pain in joint, ankle and foot 09/30/2013  . Type I (juvenile type) diabetes mellitus with neurological manifestations, not stated as uncontrolled(250.61) 07/31/2013  . Chronic anticoagulation 07/28/2013  . S/P AVR 07/28/2013  . Long term (current) use of anticoagulants 07/28/2013  . Aortic valve disorders 07/28/2013  . CAD (coronary artery disease)   . Obesity   . Hypercholesteremia   . HTN (hypertension)   . Dyslipidemia   . Stroke syndrome   . Aortic stenosis   . Thrombophlebitis   . Diabetes   .  Hyperlipidemia   . Rheumatoid arthritis(714.0)    History  Substance Use Topics  . Smoking status: Never Smoker   . Smokeless tobacco: Not on file  . Alcohol Use: No   family history includes Stroke in an other family member.     Objective:   Physical Exam well-developed white female in no acute distress, pleasant mild dysarthria blood pressure 118/40 pulse 72 height 5 foot 3 weight 155. HEENT; nontraumatic normocephalic EOMI PERRLA sclera anicteric, Supple; no JVD, Cardiovascular; regular rate and rhythm with S1-S2, valvular click, Pulmonary ;clear bilaterally, Abdomen ;soft nontender  nondistended bowel sounds are active there is no palpable mass or hepatosplenomegaly, Rectal; exam not done, Extremities ;she has a paresis of the right upper extremity, Psych; mood and affect normal and appropriate        Assessment & Plan:  #42  53 year old female referred for colon neoplasia screening, average risk. Unfortunately patient is at high-risk for complications if anticoagulation is stopped given history of aortic valve replacement and prior CVAs. #2 chronic anticoagulation with Coumadin #3 rheumatoid arthritis #4 insulin-dependent diabetes #5 history of aortic stenosis  Plan; Long discussion with patient regarding options. She does not want to take the risk of having another stroke and does not want to come off of Coumadin.  We'll proceed with Cologuard stool DNA testing initially. If this is negative she should not have to undergo further screening at this time, if positive then higher risk for adenomatous polyps or neoplasia and will bring back to the office to further discuss with Dr. Rhea Belton.  Addendum: Reviewed and agree with initial management. If negative would pursue Cologuard every 3 years unless later data indicates a different screening interval. Beverley Fiedler, MD

## 2014-01-11 NOTE — Telephone Encounter (Signed)
error 

## 2014-01-13 ENCOUNTER — Telehealth: Payer: Self-pay | Admitting: Family

## 2014-01-13 MED ORDER — CITALOPRAM HYDROBROMIDE 40 MG PO TABS: 40.0000 mg | ORAL_TABLET | Freq: Every day | ORAL | Status: DC

## 2014-01-13 MED ORDER — BACLOFEN 10 MG PO TABS: 10.0000 mg | ORAL_TABLET | Freq: Every morning | ORAL | Status: DC

## 2014-01-13 MED ORDER — OMEPRAZOLE 40 MG PO CPDR: 40.0000 mg | DELAYED_RELEASE_CAPSULE | Freq: Every day | ORAL | Status: DC

## 2014-01-13 NOTE — Telephone Encounter (Signed)
[facility] requesting scripts for the following:  citalopram (CELEXA) 40 MG tablet baclofen (LIORESAL) 10 MG tablet omeprazole (PRILOSEC) 40 MG capsule

## 2014-01-25 ENCOUNTER — Ambulatory Visit (INDEPENDENT_AMBULATORY_CARE_PROVIDER_SITE_OTHER): Payer: Medicare HMO | Admitting: General Practice

## 2014-01-25 ENCOUNTER — Telehealth: Payer: Self-pay | Admitting: Family

## 2014-01-25 ENCOUNTER — Other Ambulatory Visit: Payer: Self-pay | Admitting: General Practice

## 2014-01-25 DIAGNOSIS — Z954 Presence of other heart-valve replacement: Secondary | ICD-10-CM

## 2014-01-25 DIAGNOSIS — I359 Nonrheumatic aortic valve disorder, unspecified: Secondary | ICD-10-CM

## 2014-01-25 DIAGNOSIS — Z7901 Long term (current) use of anticoagulants: Secondary | ICD-10-CM

## 2014-01-25 LAB — POCT INR: INR: 1.9

## 2014-01-25 MED ORDER — WARFARIN SODIUM 5 MG PO TABS: ORAL_TABLET | ORAL | Status: DC

## 2014-01-25 NOTE — Telephone Encounter (Signed)
Pt is needing ne rx warfarin (COUMADIN) 5 MG tablet, and solifenacin (vesicarea) 5 mg sent to [facility].

## 2014-01-25 NOTE — Progress Notes (Signed)
Pre visit review using our clinic review tool, if applicable. No additional management support is needed unless otherwise documented below in the visit note. 

## 2014-02-02 ENCOUNTER — Other Ambulatory Visit: Payer: Self-pay | Admitting: General Practice

## 2014-02-02 MED ORDER — WARFARIN SODIUM 5 MG PO TABS: ORAL_TABLET | ORAL | Status: DC

## 2014-02-02 NOTE — Telephone Encounter (Signed)
lmom that 2 wks will be sent to [facility] per cindy boyd

## 2014-02-02 NOTE — Telephone Encounter (Signed)
Pt is all out of these meds. Can you send a 30 day to [facility]  See below, pt needs both.

## 2014-02-04 ENCOUNTER — Telehealth: Payer: Self-pay | Admitting: Family

## 2014-02-04 ENCOUNTER — Other Ambulatory Visit: Payer: Self-pay

## 2014-02-04 MED ORDER — BACLOFEN 10 MG PO TABS: 10.0000 mg | ORAL_TABLET | Freq: Every morning | ORAL | Status: DC

## 2014-02-04 MED ORDER — INSULIN ASPART PROT & ASPART (70-30 MIX) 100 UNIT/ML PEN: 40.0000 [IU] | PEN_INJECTOR | Freq: Every day | SUBCUTANEOUS | Status: DC

## 2014-02-04 MED ORDER — OMEPRAZOLE 40 MG PO CPDR: 40.0000 mg | DELAYED_RELEASE_CAPSULE | Freq: Every day | ORAL | Status: DC

## 2014-02-04 MED ORDER — PRAVASTATIN SODIUM 20 MG PO TABS: 20.0000 mg | ORAL_TABLET | Freq: Every day | ORAL | Status: DC

## 2014-02-04 NOTE — Telephone Encounter (Signed)
Pt is needing rx solifenacin (VESICARE) 5 MG tablet, sent to [facility], 90 day supply, however pt is completely out and is requesting a 30 day supply sent to [facility]. Pt states [facility] informed her that they sent a fax over 4 times but had not heard anything.

## 2014-02-05 ENCOUNTER — Other Ambulatory Visit (HOSPITAL_COMMUNITY): Payer: Medicare HMO

## 2014-02-05 MED ORDER — SOLIFENACIN SUCCINATE 5 MG PO TABS: ORAL_TABLET | ORAL | Status: DC

## 2014-02-05 NOTE — Telephone Encounter (Signed)
I have not received a request from [facility]. Rx sent to local pharmacy and 90 day supply sent to [facility]

## 2014-02-09 ENCOUNTER — Telehealth: Payer: Self-pay | Admitting: Physician Assistant

## 2014-02-09 NOTE — Telephone Encounter (Signed)
Patient is having trouble mailing her kit. Gave her the [employer] customer service number [phone]. She will call them.

## 2014-02-23 ENCOUNTER — Telehealth: Payer: Self-pay | Admitting: Endocrinology

## 2014-02-23 NOTE — Telephone Encounter (Signed)
Pt would like Rx called in  Lantus   Thank you :)

## 2014-02-23 NOTE — Telephone Encounter (Signed)
Requested call back.  

## 2014-02-25 ENCOUNTER — Ambulatory Visit: Payer: Medicare HMO

## 2014-02-25 MED ORDER — INSULIN ASPART PROT & ASPART (70-30 MIX) 100 UNIT/ML PEN: 40.0000 [IU] | PEN_INJECTOR | Freq: Every day | SUBCUTANEOUS | Status: DC

## 2014-02-25 NOTE — Telephone Encounter (Signed)
Rx sent to pharmacy   

## 2014-02-25 NOTE — Addendum Note (Signed)
Addended by: Bethann Punches E on: 02/25/2014 09:54 AM   Modules accepted: Orders

## 2014-03-01 ENCOUNTER — Telehealth: Payer: Self-pay | Admitting: Gastroenterology

## 2014-03-01 ENCOUNTER — Ambulatory Visit (INDEPENDENT_AMBULATORY_CARE_PROVIDER_SITE_OTHER): Payer: Commercial Managed Care - HMO | Admitting: General Practice

## 2014-03-01 DIAGNOSIS — Z954 Presence of other heart-valve replacement: Secondary | ICD-10-CM

## 2014-03-01 DIAGNOSIS — I359 Nonrheumatic aortic valve disorder, unspecified: Secondary | ICD-10-CM

## 2014-03-01 DIAGNOSIS — Z7901 Long term (current) use of anticoagulants: Secondary | ICD-10-CM

## 2014-03-01 DIAGNOSIS — Z5181 Encounter for therapeutic drug level monitoring: Secondary | ICD-10-CM

## 2014-03-01 DIAGNOSIS — Z952 Presence of prosthetic heart valve: Secondary | ICD-10-CM

## 2014-03-01 LAB — POCT INR: INR: 1.1

## 2014-03-01 NOTE — Telephone Encounter (Signed)
Received a fax from [facility] that pt's test was going to be cancelled. Called pt and asked if she has sent in a specimen. Pt confirmed she did in face send it in last week.

## 2014-03-01 NOTE — Progress Notes (Signed)
Pre visit review using our clinic review tool, if applicable. No additional management support is needed unless otherwise documented below in the visit note. 

## 2014-03-08 ENCOUNTER — Ambulatory Visit: Payer: Commercial Managed Care - HMO

## 2014-03-08 ENCOUNTER — Encounter (HOSPITAL_COMMUNITY): Payer: Self-pay | Admitting: Emergency Medicine

## 2014-03-08 ENCOUNTER — Inpatient Hospital Stay (HOSPITAL_COMMUNITY): Payer: Medicare HMO

## 2014-03-08 ENCOUNTER — Emergency Department (HOSPITAL_COMMUNITY): Payer: Medicare HMO

## 2014-03-08 ENCOUNTER — Inpatient Hospital Stay (HOSPITAL_COMMUNITY)
Admission: EM | Admit: 2014-03-08 | Discharge: 2014-03-12 | DRG: 637 | Disposition: A | Discharge status: Rehabilitation Facility | Payer: Medicare HMO | Attending: Internal Medicine | Admitting: Internal Medicine

## 2014-03-08 DIAGNOSIS — E1049 Type 1 diabetes mellitus with other diabetic neurological complication: Secondary | ICD-10-CM | POA: Diagnosis present

## 2014-03-08 DIAGNOSIS — Z9119 Patient's noncompliance with other medical treatment and regimen: Secondary | ICD-10-CM

## 2014-03-08 DIAGNOSIS — K922 Gastrointestinal hemorrhage, unspecified: Secondary | ICD-10-CM | POA: Diagnosis present

## 2014-03-08 DIAGNOSIS — IMO0002 Reserved for concepts with insufficient information to code with codable children: Secondary | ICD-10-CM | POA: Diagnosis present

## 2014-03-08 DIAGNOSIS — I248 Other forms of acute ischemic heart disease: Secondary | ICD-10-CM | POA: Diagnosis present

## 2014-03-08 DIAGNOSIS — Z952 Presence of prosthetic heart valve: Secondary | ICD-10-CM

## 2014-03-08 DIAGNOSIS — E861 Hypovolemia: Secondary | ICD-10-CM | POA: Diagnosis present

## 2014-03-08 DIAGNOSIS — I359 Nonrheumatic aortic valve disorder, unspecified: Secondary | ICD-10-CM

## 2014-03-08 DIAGNOSIS — Z823 Family history of stroke: Secondary | ICD-10-CM | POA: Diagnosis not present

## 2014-03-08 DIAGNOSIS — Z951 Presence of aortocoronary bypass graft: Secondary | ICD-10-CM

## 2014-03-08 DIAGNOSIS — E1010 Type 1 diabetes mellitus with ketoacidosis without coma: Secondary | ICD-10-CM | POA: Diagnosis present

## 2014-03-08 DIAGNOSIS — J9600 Acute respiratory failure, unspecified whether with hypoxia or hypercapnia: Secondary | ICD-10-CM | POA: Diagnosis present

## 2014-03-08 DIAGNOSIS — Z7901 Long term (current) use of anticoagulants: Secondary | ICD-10-CM

## 2014-03-08 DIAGNOSIS — E7800 Pure hypercholesterolemia, unspecified: Secondary | ICD-10-CM | POA: Diagnosis present

## 2014-03-08 DIAGNOSIS — R29810 Facial weakness: Secondary | ICD-10-CM | POA: Diagnosis present

## 2014-03-08 DIAGNOSIS — Z7982 Long term (current) use of aspirin: Secondary | ICD-10-CM | POA: Diagnosis not present

## 2014-03-08 DIAGNOSIS — E860 Dehydration: Secondary | ICD-10-CM | POA: Diagnosis present

## 2014-03-08 DIAGNOSIS — I214 Non-ST elevation (NSTEMI) myocardial infarction: Secondary | ICD-10-CM | POA: Diagnosis present

## 2014-03-08 DIAGNOSIS — Z794 Long term (current) use of insulin: Secondary | ICD-10-CM

## 2014-03-08 DIAGNOSIS — K5900 Constipation, unspecified: Secondary | ICD-10-CM | POA: Diagnosis present

## 2014-03-08 DIAGNOSIS — M069 Rheumatoid arthritis, unspecified: Secondary | ICD-10-CM | POA: Diagnosis present

## 2014-03-08 DIAGNOSIS — E119 Type 2 diabetes mellitus without complications: Secondary | ICD-10-CM

## 2014-03-08 DIAGNOSIS — E875 Hyperkalemia: Secondary | ICD-10-CM | POA: Diagnosis present

## 2014-03-08 DIAGNOSIS — A419 Sepsis, unspecified organism: Secondary | ICD-10-CM | POA: Diagnosis present

## 2014-03-08 DIAGNOSIS — I350 Nonrheumatic aortic (valve) stenosis: Secondary | ICD-10-CM

## 2014-03-08 DIAGNOSIS — R6520 Severe sepsis without septic shock: Secondary | ICD-10-CM | POA: Diagnosis present

## 2014-03-08 DIAGNOSIS — E872 Acidosis, unspecified: Secondary | ICD-10-CM | POA: Diagnosis present

## 2014-03-08 DIAGNOSIS — N179 Acute kidney failure, unspecified: Secondary | ICD-10-CM | POA: Diagnosis present

## 2014-03-08 DIAGNOSIS — I2489 Other forms of acute ischemic heart disease: Secondary | ICD-10-CM | POA: Diagnosis present

## 2014-03-08 DIAGNOSIS — R6521 Severe sepsis with septic shock: Secondary | ICD-10-CM | POA: Diagnosis present

## 2014-03-08 DIAGNOSIS — Z91199 Patient's noncompliance with other medical treatment and regimen due to unspecified reason: Secondary | ICD-10-CM

## 2014-03-08 DIAGNOSIS — G9341 Metabolic encephalopathy: Secondary | ICD-10-CM | POA: Diagnosis present

## 2014-03-08 DIAGNOSIS — I10 Essential (primary) hypertension: Secondary | ICD-10-CM | POA: Diagnosis present

## 2014-03-08 DIAGNOSIS — Z888 Allergy status to other drugs, medicaments and biological substances status: Secondary | ICD-10-CM

## 2014-03-08 DIAGNOSIS — K219 Gastro-esophageal reflux disease without esophagitis: Secondary | ICD-10-CM | POA: Diagnosis present

## 2014-03-08 DIAGNOSIS — E1069 Type 1 diabetes mellitus with other specified complication: Secondary | ICD-10-CM

## 2014-03-08 DIAGNOSIS — Z8673 Personal history of transient ischemic attack (TIA), and cerebral infarction without residual deficits: Secondary | ICD-10-CM | POA: Diagnosis not present

## 2014-03-08 DIAGNOSIS — I2510 Atherosclerotic heart disease of native coronary artery without angina pectoris: Secondary | ICD-10-CM | POA: Diagnosis present

## 2014-03-08 DIAGNOSIS — E1065 Type 1 diabetes mellitus with hyperglycemia: Secondary | ICD-10-CM | POA: Diagnosis present

## 2014-03-08 DIAGNOSIS — R4182 Altered mental status, unspecified: Secondary | ICD-10-CM | POA: Diagnosis present

## 2014-03-08 DIAGNOSIS — I5030 Unspecified diastolic (congestive) heart failure: Secondary | ICD-10-CM | POA: Diagnosis present

## 2014-03-08 DIAGNOSIS — Z954 Presence of other heart-valve replacement: Secondary | ICD-10-CM | POA: Diagnosis not present

## 2014-03-08 DIAGNOSIS — I509 Heart failure, unspecified: Secondary | ICD-10-CM | POA: Diagnosis present

## 2014-03-08 DIAGNOSIS — I6340 Cerebral infarction due to embolism of unspecified cerebral artery: Secondary | ICD-10-CM | POA: Diagnosis present

## 2014-03-08 DIAGNOSIS — I639 Cerebral infarction, unspecified: Secondary | ICD-10-CM | POA: Diagnosis present

## 2014-03-08 DIAGNOSIS — E1110 Type 2 diabetes mellitus with ketoacidosis without coma: Secondary | ICD-10-CM | POA: Diagnosis present

## 2014-03-08 DIAGNOSIS — Z79899 Other long term (current) drug therapy: Secondary | ICD-10-CM

## 2014-03-08 DIAGNOSIS — I6789 Other cerebrovascular disease: Secondary | ICD-10-CM

## 2014-03-08 DIAGNOSIS — E785 Hyperlipidemia, unspecified: Secondary | ICD-10-CM | POA: Diagnosis present

## 2014-03-08 LAB — CARBOXYHEMOGLOBIN
Carboxyhemoglobin: 1.6 % — ABNORMAL HIGH (ref 0.5–1.5)
Methemoglobin: 1 % (ref 0.0–1.5)
O2 Saturation: 87 %
Total hemoglobin: 10.4 g/dL — ABNORMAL LOW (ref 12.0–16.0)

## 2014-03-08 LAB — CORTISOL-AM, BLOOD: Cortisol - AM: 25.2 ug/dL — ABNORMAL HIGH (ref 4.3–22.4)

## 2014-03-08 LAB — I-STAT VENOUS BLOOD GAS, ED
Acid-base deficit: 26 mmol/L — ABNORMAL HIGH (ref 0.0–2.0)
BICARBONATE: 4.8 meq/L — AB (ref 20.0–24.0)
O2 Saturation: 63 %
PH VEN: 6.946 — AB (ref 7.250–7.300)
TCO2: 5 mmol/L (ref 0–100)
pCO2, Ven: 22 mmHg — ABNORMAL LOW (ref 45.0–50.0)
pO2, Ven: 51 mmHg — ABNORMAL HIGH (ref 30.0–45.0)

## 2014-03-08 LAB — CBC
HCT: 30.4 % — ABNORMAL LOW (ref 36.0–46.0)
HEMATOCRIT: 38.8 % (ref 36.0–46.0)
HEMOGLOBIN: 10.3 g/dL — AB (ref 12.0–15.0)
Hemoglobin: 12.1 g/dL (ref 12.0–15.0)
MCH: 28.9 pg (ref 26.0–34.0)
MCH: 28.1 pg (ref 26.0–34.0)
MCHC: 31.2 g/dL (ref 30.0–36.0)
MCHC: 33.9 g/dL (ref 30.0–36.0)
MCV: 92.8 fL (ref 78.0–100.0)
MCV: 82.8 fL (ref 78.0–100.0)
Platelets: 351 10*3/uL (ref 150–400)
Platelets: 253 10*3/uL (ref 150–400)
RBC: 4.18 MIL/uL (ref 3.87–5.11)
RBC: 3.67 MIL/uL — ABNORMAL LOW (ref 3.87–5.11)
RDW: 14.1 % (ref 11.5–15.5)
RDW: 13.1 % (ref 11.5–15.5)
WBC: 27 10*3/uL — ABNORMAL HIGH (ref 4.0–10.5)
WBC: 20.5 10*3/uL — AB (ref 4.0–10.5)

## 2014-03-08 LAB — URINALYSIS, ROUTINE W REFLEX MICROSCOPIC
BILIRUBIN URINE: NEGATIVE
Hgb urine dipstick: NEGATIVE
KETONES UR: 40 mg/dL — AB
KETONES UR: 40 mg/dL — AB
LEUKOCYTES UA: NEGATIVE
Leukocytes, UA: NEGATIVE
Nitrite: NEGATIVE
Nitrite: NEGATIVE
PROTEIN: NEGATIVE mg/dL
PROTEIN: 30 mg/dL — AB
Specific Gravity, Urine: 1.027 (ref 1.005–1.030)
Specific Gravity, Urine: 1.025 (ref 1.005–1.030)
UROBILINOGEN UA: 0.2 mg/dL (ref 0.0–1.0)
Urobilinogen, UA: 1 mg/dL (ref 0.0–1.0)
pH: 5 (ref 5.0–8.0)
pH: 5 (ref 5.0–8.0)

## 2014-03-08 LAB — URINE MICROSCOPIC-ADD ON

## 2014-03-08 LAB — OCCULT BLOOD GASTRIC / DUODENUM (SPECIMEN CUP): Occult Blood, Gastric: POSITIVE — AB

## 2014-03-08 LAB — POCT I-STAT 3, ART BLOOD GAS (G3+)
ACID-BASE DEFICIT: 8 mmol/L — AB (ref 0.0–2.0)
Bicarbonate: 14.4 mEq/L — ABNORMAL LOW (ref 20.0–24.0)
O2 Saturation: 100 %
PH ART: 7.418 (ref 7.350–7.450)
PO2 ART: 244 mmHg — AB (ref 80.0–100.0)
Patient temperature: 100.2
TCO2: 15 mmol/L (ref 0–100)
pCO2 arterial: 22.6 mmHg — ABNORMAL LOW (ref 35.0–45.0)

## 2014-03-08 LAB — BASIC METABOLIC PANEL
BUN: 32 mg/dL — AB (ref 6–23)
BUN: 31 mg/dL — AB (ref 6–23)
BUN: 30 mg/dL — ABNORMAL HIGH (ref 6–23)
BUN: 27 mg/dL — ABNORMAL HIGH (ref 6–23)
BUN: 25 mg/dL — ABNORMAL HIGH (ref 6–23)
CALCIUM: 7.6 mg/dL — AB (ref 8.4–10.5)
CALCIUM: 8 mg/dL — AB (ref 8.4–10.5)
CALCIUM: 8.2 mg/dL — AB (ref 8.4–10.5)
CHLORIDE: 114 meq/L — AB (ref 96–112)
CO2: 8 mEq/L — CL (ref 19–32)
CO2: 11 mEq/L — ABNORMAL LOW (ref 19–32)
CO2: 12 mEq/L — ABNORMAL LOW (ref 19–32)
CO2: 14 mEq/L — ABNORMAL LOW (ref 19–32)
CO2: 14 meq/L — AB (ref 19–32)
CREATININE: 1.43 mg/dL — AB (ref 0.50–1.10)
CREATININE: 1.38 mg/dL — AB (ref 0.50–1.10)
CREATININE: 1.11 mg/dL — AB (ref 0.50–1.10)
Calcium: 8 mg/dL — ABNORMAL LOW (ref 8.4–10.5)
Calcium: 8 mg/dL — ABNORMAL LOW (ref 8.4–10.5)
Chloride: 109 mEq/L (ref 96–112)
Chloride: 112 mEq/L (ref 96–112)
Chloride: 113 mEq/L — ABNORMAL HIGH (ref 96–112)
Chloride: 116 mEq/L — ABNORMAL HIGH (ref 96–112)
Creatinine, Ser: 1.56 mg/dL — ABNORMAL HIGH (ref 0.50–1.10)
Creatinine, Ser: 1.18 mg/dL — ABNORMAL HIGH (ref 0.50–1.10)
GFR calc Af Amer: 43 mL/min — ABNORMAL LOW (ref >90)
GFR calc Af Amer: 65 mL/min — ABNORMAL LOW (ref >90)
GFR calc non Af Amer: 43 mL/min — ABNORMAL LOW (ref >90)
GFR calc non Af Amer: 52 mL/min — ABNORMAL LOW (ref >90)
GFR calc non Af Amer: 56 mL/min — ABNORMAL LOW (ref >90)
GFR, EST AFRICAN AMERICAN: 47 mL/min — AB (ref >90)
GFR, EST AFRICAN AMERICAN: 50 mL/min — AB (ref >90)
GFR, EST AFRICAN AMERICAN: 60 mL/min — AB (ref >90)
GFR, EST NON AFRICAN AMERICAN: 37 mL/min — AB (ref >90)
GFR, EST NON AFRICAN AMERICAN: 41 mL/min — AB (ref >90)
GLUCOSE: 541 mg/dL — AB (ref 70–99)
GLUCOSE: 150 mg/dL — AB (ref 70–99)
Glucose, Bld: 393 mg/dL — ABNORMAL HIGH (ref 70–99)
Glucose, Bld: 304 mg/dL — ABNORMAL HIGH (ref 70–99)
Glucose, Bld: 158 mg/dL — ABNORMAL HIGH (ref 70–99)
POTASSIUM: 3.2 meq/L — AB (ref 3.7–5.3)
Potassium: 4 mEq/L (ref 3.7–5.3)
Potassium: 3.5 mEq/L — ABNORMAL LOW (ref 3.7–5.3)
Potassium: 3.4 mEq/L — ABNORMAL LOW (ref 3.7–5.3)
Potassium: 3.9 mEq/L (ref 3.7–5.3)
SODIUM: 142 meq/L (ref 137–147)
Sodium: 144 mEq/L (ref 137–147)
Sodium: 146 mEq/L (ref 137–147)
Sodium: 146 mEq/L (ref 137–147)
Sodium: 143 mEq/L (ref 137–147)

## 2014-03-08 LAB — I-STAT ARTERIAL BLOOD GAS, ED
Acid-base deficit: 24 mmol/L — ABNORMAL HIGH (ref 0.0–2.0)
Bicarbonate: 4.3 mEq/L — ABNORMAL LOW (ref 20.0–24.0)
O2 SAT: 100 %
PCO2 ART: 15.2 mmHg — AB (ref 35.0–45.0)
Patient temperature: 98.9
pH, Arterial: 7.062 — CL (ref 7.350–7.450)
pO2, Arterial: 259 mmHg — ABNORMAL HIGH (ref 80.0–100.0)

## 2014-03-08 LAB — COMPREHENSIVE METABOLIC PANEL
ALK PHOS: 133 U/L — AB (ref 39–117)
ALT: 15 U/L (ref 0–35)
AST: 16 U/L (ref 0–37)
Albumin: 3.7 g/dL (ref 3.5–5.2)
BUN: 36 mg/dL — ABNORMAL HIGH (ref 6–23)
CHLORIDE: 86 meq/L — AB (ref 96–112)
CREATININE: 1.82 mg/dL — AB (ref 0.50–1.10)
Calcium: 9.4 mg/dL (ref 8.4–10.5)
GFR calc Af Amer: 35 mL/min — ABNORMAL LOW (ref >90)
GFR, EST NON AFRICAN AMERICAN: 31 mL/min — AB (ref >90)
Glucose, Bld: 1124 mg/dL (ref 70–99)
POTASSIUM: 7.3 meq/L — AB (ref 3.7–5.3)
SODIUM: 132 meq/L — AB (ref 137–147)
Total Protein: 7.5 g/dL (ref 6.0–8.3)

## 2014-03-08 LAB — CBG MONITORING, ED
Glucose-Capillary: >600 mg/dL (ref 70–99)
Glucose-Capillary: >600 mg/dL (ref 70–99)
Glucose-Capillary: >600 mg/dL (ref 70–99)

## 2014-03-08 LAB — GLUCOSE, CAPILLARY
GLUCOSE-CAPILLARY: 531 mg/dL — AB (ref 70–99)
GLUCOSE-CAPILLARY: 193 mg/dL — AB (ref 70–99)
GLUCOSE-CAPILLARY: 118 mg/dL — AB (ref 70–99)
GLUCOSE-CAPILLARY: 145 mg/dL — AB (ref 70–99)
Glucose-Capillary: 500 mg/dL — ABNORMAL HIGH (ref 70–99)
Glucose-Capillary: 366 mg/dL — ABNORMAL HIGH (ref 70–99)
Glucose-Capillary: 311 mg/dL — ABNORMAL HIGH (ref 70–99)
Glucose-Capillary: 165 mg/dL — ABNORMAL HIGH (ref 70–99)
Glucose-Capillary: 118 mg/dL — ABNORMAL HIGH (ref 70–99)

## 2014-03-08 LAB — CK: CK TOTAL: 155 U/L (ref 7–177)

## 2014-03-08 LAB — LACTIC ACID, PLASMA
LACTIC ACID, VENOUS: 3.4 mmol/L — AB (ref 0.5–2.2)
LACTIC ACID, VENOUS: 3.1 mmol/L — AB (ref 0.5–2.2)

## 2014-03-08 LAB — TROPONIN I
Troponin I: <0.3 ng/mL (ref <0.30)
Troponin I: <0.3 ng/mL (ref <0.30)
Troponin I: 0.32 ng/mL (ref <0.30)

## 2014-03-08 LAB — PROCALCITONIN: Procalcitonin: 2.06 ng/mL

## 2014-03-08 LAB — PROTIME-INR
INR: 1.37 (ref 0.00–1.49)
Prothrombin Time: 16.5 seconds — ABNORMAL HIGH (ref 11.6–15.2)

## 2014-03-08 LAB — MAGNESIUM: MAGNESIUM: 2 mg/dL (ref 1.5–2.5)

## 2014-03-08 LAB — I-STAT CG4 LACTIC ACID, ED: LACTIC ACID, VENOUS: 9.81 mmol/L — AB (ref 0.5–2.2)

## 2014-03-08 LAB — KETONES, QUALITATIVE

## 2014-03-08 LAB — PHOSPHORUS: PHOSPHORUS: 1 mg/dL — AB (ref 2.3–4.6)

## 2014-03-08 LAB — MRSA PCR SCREENING: MRSA BY PCR: NEGATIVE

## 2014-03-08 MED ORDER — SODIUM CHLORIDE 0.9 % IV SOLN: 250.0000 mL | INTRAVENOUS | Status: DC | PRN

## 2014-03-08 MED ORDER — DEXTROSE 50 % IV SOLN
25.0000 mL | INTRAVENOUS | Status: DC | PRN
Start: 2014-03-08 — End: 2014-03-12

## 2014-03-08 MED ORDER — DEXTROSE-NACL 5-0.45 % IV SOLN
INTRAVENOUS | Status: DC
  Administered 2014-03-08 – 2014-03-10 (×4): via INTRAVENOUS

## 2014-03-08 MED ORDER — HEPARIN SODIUM (PORCINE) 5000 UNIT/ML IJ SOLN
5000.0000 [IU] | Freq: Three times a day (TID) | INTRAMUSCULAR | Status: DC
  Filled 2014-03-08 (×3): qty 1

## 2014-03-08 MED ORDER — SODIUM CHLORIDE 0.45 % IV SOLN
INTRAVENOUS | Status: DC
  Filled 2014-03-08: qty 1000

## 2014-03-08 MED ORDER — SODIUM CHLORIDE 0.9 % IV SOLN
3.0000 g | Freq: Four times a day (QID) | INTRAVENOUS | Status: DC
  Administered 2014-03-08 – 2014-03-10 (×8): 3 g via INTRAVENOUS
  Filled 2014-03-08 (×10): qty 3

## 2014-03-08 MED ORDER — SODIUM CHLORIDE 0.9 % IV SOLN
INTRAVENOUS | Status: DC
  Administered 2014-03-08: 16:00:00 via INTRAVENOUS
  Administered 2014-03-08: 2.6 [IU]/h via INTRAVENOUS
  Filled 2014-03-08 (×4): qty 1

## 2014-03-08 MED ORDER — VANCOMYCIN HCL IN DEXTROSE 750-5 MG/150ML-% IV SOLN
750.0000 mg | Freq: Two times a day (BID) | INTRAVENOUS | Status: DC
  Administered 2014-03-08 – 2014-03-09 (×4): 750 mg via INTRAVENOUS
  Filled 2014-03-08 (×5): qty 150

## 2014-03-08 MED ORDER — SODIUM CHLORIDE 0.9 % IV BOLUS (SEPSIS)
1000.0000 mL | Freq: Once | INTRAVENOUS | Status: AC
  Administered 2014-03-08: 1000 mL via INTRAVENOUS

## 2014-03-08 MED ORDER — MIDAZOLAM HCL 2 MG/2ML IJ SOLN
INTRAMUSCULAR | Status: AC
  Filled 2014-03-08: qty 2

## 2014-03-08 MED ORDER — SODIUM CHLORIDE 0.9 % IV SOLN
1000.0000 mL | Freq: Once | INTRAVENOUS | Status: AC
  Administered 2014-03-08: 1000 mL via INTRAVENOUS

## 2014-03-08 MED ORDER — SODIUM CHLORIDE 0.9 % IV BOLUS (SEPSIS): 1000.0000 mL | INTRAVENOUS | Status: DC | PRN

## 2014-03-08 MED ORDER — POTASSIUM CHLORIDE 10 MEQ/50ML IV SOLN
10.0000 meq | INTRAVENOUS | Status: AC
  Administered 2014-03-08 (×3): 10 meq via INTRAVENOUS
  Filled 2014-03-08 (×3): qty 50

## 2014-03-08 MED ORDER — HEPARIN SODIUM (PORCINE) 5000 UNIT/ML IJ SOLN
5000.0000 [IU] | Freq: Three times a day (TID) | INTRAMUSCULAR | Status: DC
  Administered 2014-03-08 – 2014-03-09 (×2): 5000 [IU] via SUBCUTANEOUS
  Filled 2014-03-08 (×6): qty 1

## 2014-03-08 MED ORDER — FAMOTIDINE 20 MG PO TABS
20.0000 mg | ORAL_TABLET | Freq: Every day | ORAL | Status: DC
  Administered 2014-03-08: 20 mg via ORAL
  Filled 2014-03-08: qty 1

## 2014-03-08 MED ORDER — INSULIN REGULAR HUMAN 100 UNIT/ML IJ SOLN
INTRAMUSCULAR | Status: DC
  Administered 2014-03-08: 5.4 [IU]/h via INTRAVENOUS
  Filled 2014-03-08: qty 1

## 2014-03-08 MED ORDER — SODIUM CHLORIDE 0.9 % IJ SOLN: 3.0000 mL | Freq: Two times a day (BID) | INTRAMUSCULAR | Status: DC

## 2014-03-08 MED ORDER — SODIUM CHLORIDE 0.9 % IJ SOLN: 3.0000 mL | INTRAMUSCULAR | Status: DC | PRN

## 2014-03-08 MED ORDER — ETOMIDATE 2 MG/ML IV SOLN
20.0000 mg | Freq: Once | INTRAVENOUS | Status: AC
  Administered 2014-03-08: 15 mg via INTRAVENOUS
  Filled 2014-03-08: qty 10

## 2014-03-08 MED ORDER — PANTOPRAZOLE SODIUM 40 MG IV SOLR
40.0000 mg | Freq: Two times a day (BID) | INTRAVENOUS | Status: DC
  Administered 2014-03-09: 40 mg via INTRAVENOUS
  Filled 2014-03-08 (×3): qty 40

## 2014-03-08 MED ORDER — CHLORHEXIDINE GLUCONATE 0.12 % MT SOLN
15.0000 mL | Freq: Two times a day (BID) | OROMUCOSAL | Status: DC
  Administered 2014-03-08 – 2014-03-09 (×2): 15 mL via OROMUCOSAL
  Filled 2014-03-08 (×2): qty 15

## 2014-03-08 MED ORDER — FENTANYL CITRATE 0.05 MG/ML IJ SOLN
100.0000 ug | Freq: Once | INTRAMUSCULAR | Status: AC
  Administered 2014-03-08: 100 ug via INTRAVENOUS
  Filled 2014-03-08: qty 2

## 2014-03-08 MED ORDER — POTASSIUM PHOSPHATE DIBASIC 3 MMOLE/ML IV SOLN
20.0000 meq | Freq: Once | INTRAVENOUS | Status: AC
  Administered 2014-03-08: 20 meq via INTRAVENOUS
  Filled 2014-03-08: qty 4.55

## 2014-03-08 MED ORDER — NOREPINEPHRINE BITARTRATE 1 MG/ML IJ SOLN
5.0000 ug/min | INTRAVENOUS | Status: DC
  Administered 2014-03-08: 17 ug/min via INTRAVENOUS
  Filled 2014-03-08: qty 4

## 2014-03-08 MED ORDER — FAMOTIDINE 20 MG PO TABS: 20.0000 mg | ORAL_TABLET | Freq: Every day | ORAL | Status: DC

## 2014-03-08 MED ORDER — SODIUM CHLORIDE 0.9 % IV BOLUS (SEPSIS)
2000.0000 mL | Freq: Once | INTRAVENOUS | Status: AC
  Administered 2014-03-08: 2000 mL via INTRAVENOUS

## 2014-03-08 MED ORDER — SODIUM CHLORIDE 0.9 % IV SOLN
INTRAVENOUS | Status: DC
  Administered 2014-03-08 (×2): via INTRAVENOUS

## 2014-03-08 MED ORDER — SODIUM CHLORIDE 0.45 % IV SOLN
INTRAVENOUS | Status: DC
  Administered 2014-03-08: 14:00:00 via INTRAVENOUS
  Filled 2014-03-08 (×2): qty 1000

## 2014-03-08 MED ORDER — POTASSIUM CHLORIDE 10 MEQ/50ML IV SOLN
10.0000 meq | Freq: Once | INTRAVENOUS | Status: AC
  Administered 2014-03-08: 10 meq via INTRAVENOUS
  Filled 2014-03-08: qty 50

## 2014-03-08 MED ORDER — MIDAZOLAM HCL 2 MG/2ML IJ SOLN
2.0000 mg | Freq: Once | INTRAMUSCULAR | Status: AC
  Administered 2014-03-08: 2 mg via INTRAVENOUS

## 2014-03-08 MED ORDER — BIOTENE DRY MOUTH MT LIQD
15.0000 mL | Freq: Four times a day (QID) | OROMUCOSAL | Status: DC
  Administered 2014-03-09 (×4): 15 mL via OROMUCOSAL

## 2014-03-08 MED ORDER — NOREPINEPHRINE BITARTRATE 1 MG/ML IJ SOLN
2.0000 ug/min | INTRAVENOUS | Status: DC
  Administered 2014-03-08: 8 ug/min via INTRAVENOUS
  Filled 2014-03-08 (×3): qty 4

## 2014-03-08 MED ORDER — NOREPINEPHRINE BITARTRATE 1 MG/ML IJ SOLN
2.0000 ug/min | INTRAVENOUS | Status: DC
  Administered 2014-03-09: 15 ug/min via INTRAVENOUS
  Filled 2014-03-08 (×3): qty 8

## 2014-03-08 MED ORDER — SODIUM CHLORIDE 0.9 % IJ SOLN
3.0000 mL | Freq: Two times a day (BID) | INTRAMUSCULAR | Status: DC
  Administered 2014-03-10 – 2014-03-12 (×5): 3 mL via INTRAVENOUS

## 2014-03-08 MED ORDER — DEXTROSE-NACL 5-0.45 % IV SOLN: INTRAVENOUS | Status: DC

## 2014-03-08 MED ORDER — SODIUM CHLORIDE 0.9 % IV SOLN
INTRAVENOUS | Status: AC
  Administered 2014-03-08: 13:00:00 via INTRAVENOUS

## 2014-03-08 MED ORDER — SODIUM CHLORIDE 0.9 % IV SOLN: 1000.0000 mL | INTRAVENOUS | Status: DC

## 2014-03-08 MED ORDER — SODIUM CHLORIDE 0.9 % IV SOLN
10.0000 ug/h | INTRAVENOUS | Status: DC
  Administered 2014-03-08: 20 ug/h via INTRAVENOUS
  Administered 2014-03-08: 25 ug/h via INTRAVENOUS
  Filled 2014-03-08: qty 50

## 2014-03-08 NOTE — Progress Notes (Signed)
UR Completed.  Raef Sprigg Jane Kendarrius Tanzi [phone] 03/08/2014  

## 2014-03-08 NOTE — ED Notes (Signed)
Pt to department via EMS from home- pt family called this morning stating that she was not acting like herself. Pt was responsive to painful stimuli on EMS arrival. Pt with hx of diabetes, cbg read high on the monitor. Pt on NRB at this time. Bp-132/64 Hr-120 Pt with EJ for access on arrival. RR high on arrival.

## 2014-03-08 NOTE — Progress Notes (Signed)
**Note De-Identified  Obfuscation** RT note: panic value of ABG called to Dr. Tyson Alias.

## 2014-03-08 NOTE — ED Notes (Signed)
PCCM MD at the bedside to place central line

## 2014-03-08 NOTE — ED Notes (Signed)
Talked with Admitting team about pt's BP. Pt currently getting 1 of 2 L of NS. Pt to be transferred to the Unit, and reevaluated.

## 2014-03-08 NOTE — Consult Note (Signed)
Referring Physician: Dr. Tyson Alias    Chief Complaint: Acute onset encephalopathy and respiratory failure associated with DKA, and abnormal CT scan with acute/subacute stroke  HPI: Madison [name] is an 53 y.o. female history of diabetes mellitus, hypertension, hyperlipidemia, previous stroke, DVT and aortic valve replacement, subtherapeutic on Coumadin, who was admitted for acute respiratory failure and DKA. Patient was intubated in the emergency room. Initial blood sugar was 1124. Patient was found unresponsive by family members. She was last seen well on the evening of 03/07/2014. 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 INR on admission was 1.37.  LSN: Evening of 03/07/2014 tPA Given: No: Beyond time under for treatment consideration MRankin: 4  Past Medical History  Diagnosis Date  . CAD (coronary artery disease)   . Obesity   . Hypercholesteremia   . HTN (hypertension)   . Dyslipidemia   . Stroke syndrome   . Aortic stenosis   . Thrombophlebitis   . Diabetes   . Hyperlipidemia   . Rheumatoid arthritis(714.0)     Family History  Problem Relation Age of Onset  . Stroke       Medications: I have reviewed the patient's current medications.  ROS: History obtained from unobtainable from patient due to mental status   Physical Examination: Blood pressure 129/47, pulse 106, temperature 100.3 F (37.9 C), temperature source Core (Comment), resp. rate 35, height 5\' 8"  (1.727 m), SpO2 100.00%.  Neurologic Examination: Mental Status: Patient was intubated and on mechanical ventilation. She was sedated with fentanyl drip but could respond with head nodding and follow commands with use of left extremities. Cranial Nerves: II-unable to evaluate. III/IV/VI-Pupils were equal and reacted. Extraocular movements were intact to oculocephalic maneuvers.    VII-no facial weakness  noted. VIII-normal. Motor: No voluntary movement of left upper and lower extremities with frequent occurrences of marked spasticity and clonus, lower extremity greater than upper extremity. Muscle tone of right extremities was slightly increased.. Deep Tendon Reflexes: 2+ and brisk on the right and 4+ on the left with sustained clonus at knee and ankle. Plantars: Extensor on the left and flexor on the right.  Ct Head Wo Contrast  03/08/2014   CLINICAL DATA:  Altered mental status  EXAM: CT HEAD WITHOUT CONTRAST  TECHNIQUE: Contiguous axial images were obtained from the base of the skull through the vertex without intravenous contrast.  COMPARISON:  None.  FINDINGS: Extensive chronic ischemic changes are present. There is severe encephalomalacia in the right parietal lobe. Extensive chronic ischemic changes in the periventricular white matter. Encephalomalacia from focal infarcts in the left occipital and parietal lobes is also present. There is low-density in the gray matter and adjacent white matter of the right frontal lobe with effacement of the sulci and intermediate Hounsfield unit measurements. This has a subacute appearance. No evidence of acute intracranial hemorrhage. No midline shift. Mild global atrophy.  IMPRESSION: Extensive chronic ischemic changes as described. 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   Electronically Signed   By: 03/10/2014 M.D.   On: 03/08/2014 18:04   Dg Chest Port 1 View  03/08/2014   CLINICAL DATA:  Central line placement.  EXAM: PORTABLE CHEST - 1 VIEW  COMPARISON:  03/08/2014  FINDINGS: Prior median sternotomy and valve replacement. Endotracheal tube remains approximately 2 cm above the carina. Right central line is in place with the tip at the cavoatrial junction.  No pneumothorax.  Hyperinflation of the lungs. No confluent opacities. No effusions. No acute bony abnormality.  IMPRESSION: Right central line tip at the cavoatrial junction.   No pneumothorax.  Mild hyperinflation.  No acute cardiopulmonary disease.   Electronically Signed   By: Charlett Nose M.D.   On: 03/08/2014 13:49   Dg Chest Portable 1 View  03/08/2014   CLINICAL DATA:  Intubation, central line placement  EXAM: PORTABLE CHEST - 1 VIEW  COMPARISON:  Portable exam 1116 hr compared to 03/08/2014 at 0849 hr  FINDINGS: Tip of endotracheal tube projects 2.2 cm above carinal.  No central venous catheter identified.  Normal heart size post AVR.  Mediastinal contours and pulmonary vascularity normal.  Lungs clear.  No pleural effusion, pneumothorax or acute osseous findings.  IMPRESSION: No acute abnormalities.   Electronically Signed   By: Ulyses Southward M.D.   On: 03/08/2014 11:41   Dg Chest Portable 1 View  03/08/2014   CLINICAL DATA:  Altered mental status.  Hyperglycemia.  EXAM: PORTABLE CHEST - 1 VIEW  COMPARISON:  None.  FINDINGS: There has been previous median sternotomy and aortic valve replacement. Heart size is normal. The lungs are clear. The vascularity is normal. No effusions.  IMPRESSION: Previous AVR.  No active disease.   Electronically Signed   By: Paulina Fusi M.D.   On: 03/08/2014 09:04   Dg Abd Portable 1v  03/08/2014   CLINICAL DATA:  NG tube placement.  EXAM: PORTABLE ABDOMEN - 1 VIEW  COMPARISON:  None.  FINDINGS: NG tube coils in the fundus. The catheter then passes distally with the tip in the antrum of the stomach.  Calcified fibroid in the pelvis. Nonobstructive bowel gas pattern. No free air organomegaly.  IMPRESSION: NG tube coils in the fundus with the tip in the antrum.   Electronically Signed   By: Charlett Nose M.D.   On: 03/08/2014 13:53    Assessment: 53 y.o. female with DKA and respiratory failure, with encephalopathic state as well as acute/subacute left frontal ischemic stroke.  Stroke Risk Factors - diabetes mellitus, hyperlipidemia and hypertension  Plan: 1. HgbA1c, fasting lipid panel 2. MRI, MRA  of the brain without contrast 3. PT  consult, OT consult, Speech consult 4. Echocardiogram 5. Carotid dopplers 6. Prophylactic therapy-Anticoagulation: Heparin drip IV until INR is therapeutic on Coumadin, if patient has no acute contraindications to anticoagulation 7. Risk factor modification 8. Telemetry monitoring   C.R. Roseanne Reno, MD [facility] [phone]  03/08/2014, 7:54 PM

## 2014-03-08 NOTE — Procedures (Signed)
Intubation Procedure Note Madison [name] [id number] [birth year]  Procedure: Intubation Indications: Respiratory insufficiency  Procedure Details Consent: Unable to obtain consent because of emergent medical necessity. Time Out: Verified patient identification, verified procedure, site/side was marked, verified correct patient position, special equipment/implants available, medications/allergies/relevent history reviewed, required imaging and test results available.  Performed  Maximum sterile technique was used including cap, gloves, gown, hand hygiene and mask.  MAC and 3    Evaluation Hemodynamic Status: BP stable throughout; O2 sats: stable throughout Patient's Current Condition: stable Complications: No apparent complications Patient did tolerate procedure well. Chest X-ray ordered to verify placement.  CXR: pending.   Madison [name] 03/08/2014

## 2014-03-08 NOTE — ED Notes (Signed)
Pt able to open eyes to verbal stimuli and nod head to questions. Pt pulled up in bed, remains on cardiac monitor. Lab at the bedside.

## 2014-03-08 NOTE — ED Provider Notes (Signed)
CSN: [id number]     Arrival date & time 03/08/14  5956 History   First MD Initiated Contact with Patient 03/08/14 [phone]     Chief Complaint  Patient presents with  . Altered Mental Status  . Hyperglycemia   Level V caveat: Altered mental status  (Consider location/radiation/quality/duration/timing/severity/associated sxs/prior Treatment) The history is provided by the EMS personnel.   history of present illness: 53 year old female who presents from home with altered mental status and hypoglycemia. Per EMS report patient last seen normal last night. Her son called EMS because she was not acting like herself. On arrival EMS found patient unresponsive to painful stimuli. Point-of-care glucose read greater than 600. She received 400 mL's of normal saline in route. They report improving mental status with patient now opening eyes to voice. No known recent illness.  Past Medical History  Diagnosis Date  . CAD (coronary artery disease)   . Obesity   . Hypercholesteremia   . HTN (hypertension)   . Dyslipidemia   . Stroke syndrome   . Aortic stenosis   . Thrombophlebitis   . Diabetes   . Hyperlipidemia   . Rheumatoid arthritis(714.0)    Past Surgical History  Procedure Laterality Date  . Cesarean section    . Foot fracture surgery    . Knee surgery    . Neuroplasty / transposition median nerve at carpal tunnel     Family History  Problem Relation Age of Onset  . Stroke     History  Substance Use Topics  . Smoking status: Never Smoker   . Smokeless tobacco: Not on file  . Alcohol Use: No   OB History   Grav Para Term Preterm Abortions TAB SAB Ect Mult Living                 Review of Systems  Unable to perform ROS: Mental status change      Allergies  Celebrex and Detrol  Home Medications   Prior to Admission medications   Medication Sig Start Date End Date Taking? Authorizing Provider  aspirin 81 MG tablet Take 81 mg by mouth daily.    Historical Provider, MD   baclofen (LIORESAL) 10 MG tablet Take 1 tablet (10 mg total) by mouth every morning. 02/04/14   Baker Pierini, FNP  citalopram (CELEXA) 40 MG tablet Take 1 tablet (40 mg total) by mouth daily. 01/13/14   Baker Pierini, FNP  glucose blood (ONE TOUCH ULTRA TEST) test strip 1 each by Other route 4 (four) times daily. And lancets 4/day 250.01 11/30/13   Romero Belling, MD  Insulin Aspart Prot & Aspart (NOVOLOG MIX 70/30 FLEXPEN) (70-30) 100 UNIT/ML Pen Inject 40 Units into the skin daily with breakfast. And pen needles 1/day 02/25/14   Romero Belling, MD  omeprazole (PRILOSEC) 40 MG capsule Take 1 capsule (40 mg total) by mouth daily. 02/04/14   Baker Pierini, FNP  pravastatin (PRAVACHOL) 20 MG tablet Take 1 tablet (20 mg total) by mouth daily. 02/04/14   Baker Pierini, FNP  solifenacin (VESICARE) 5 MG tablet Take 1/2 tablet once daily 02/05/14   Baker Pierini, FNP  triamcinolone cream (KENALOG) 0.1 % Apply topically 2 (two) times daily. 10/02/13   Baker Pierini, FNP  warfarin (COUMADIN) 5 MG tablet Take as directed by anticoagulation clinic 02/02/14   Baker Pierini, FNP   BP 106/59  Pulse 131  Temp(Src) 98.9 F (37.2 C) (Rectal)  Resp 30  SpO2 95% Physical Exam  Nursing note and vitals reviewed. Constitutional: She appears well-developed and well-nourished. She appears ill.  HENT:  Head: Normocephalic and atraumatic.  Mouth/Throat: Mucous membranes are dry.  Eyes: Pupils are equal, round, and reactive to light.  Neck: Neck supple.  Cardiovascular: Regular rhythm and intact distal pulses.  Tachycardia present.   Pulmonary/Chest: Tachypnea noted. She has no wheezes. She has no rales.  Abdominal: Soft. She exhibits no distension.  Musculoskeletal: She exhibits no edema.  Neurological: She is disoriented. GCS eye subscore is 3. GCS verbal subscore is 2. GCS motor subscore is 5.  Skin: No rash noted.    ED Course  Procedures (including critical care time) Labs  Review Labs Reviewed  CBC - Abnormal; Notable for the following:    WBC 27.0 (*)    All other components within normal limits  COMPREHENSIVE METABOLIC PANEL - Abnormal; Notable for the following:    Sodium 132 (*)    Potassium 7.3 (*)    Chloride 86 (*)    CO2 <7 (*)    Glucose, Bld 1124 (*)    BUN 36 (*)    Creatinine, Ser 1.82 (*)    Alkaline Phosphatase 133 (*)    Total Bilirubin <0.2 (*)    GFR calc non Af Amer 31 (*)    GFR calc Af Amer 35 (*)    All other components within normal limits  URINALYSIS, ROUTINE W REFLEX MICROSCOPIC - Abnormal; Notable for the following:    Glucose, UA >1000 (*)    Ketones, ur 40 (*)    All other components within normal limits  PROTIME-INR - Abnormal; Notable for the following:    Prothrombin Time 16.5 (*)    All other components within normal limits  KETONES, QUALITATIVE - Abnormal; Notable for the following:    Acetone, Bld SMALL (*)    All other components within normal limits  LACTIC ACID, PLASMA - Abnormal; Notable for the following:    Lactic Acid, Venous 3.4 (*)    All other components within normal limits  BASIC METABOLIC PANEL - Abnormal; Notable for the following:    CO2 8 (*)    Glucose, Bld 541 (*)    BUN 32 (*)    Creatinine, Ser 1.56 (*)    Calcium 7.6 (*)    GFR calc non Af Amer 37 (*)    GFR calc Af Amer 43 (*)    All other components within normal limits  URINALYSIS, ROUTINE W REFLEX MICROSCOPIC - Abnormal; Notable for the following:    APPearance CLOUDY (*)    Glucose, UA >1000 (*)    Hgb urine dipstick MODERATE (*)    Bilirubin Urine MODERATE (*)    Ketones, ur 40 (*)    Protein, ur 30 (*)    All other components within normal limits  URINE MICROSCOPIC-ADD ON - Abnormal; Notable for the following:    Bacteria, UA FEW (*)    Casts GRANULAR CAST (*)    All other components within normal limits  CARBOXYHEMOGLOBIN - Abnormal; Notable for the following:    Total hemoglobin 10.4 (*)    Carboxyhemoglobin 1.6 (*)     All other components within normal limits  CBG MONITORING, ED - Abnormal; Notable for the following:    Glucose-Capillary >600 (*)    All other components within normal limits  I-STAT CG4 LACTIC ACID, ED - Abnormal; Notable for the following:    Lactic Acid, Venous 9.81 (*)    All other components within normal limits  I-STAT VENOUS BLOOD GAS, ED - Abnormal; Notable for the following:    pH, Ven 6.946 (*)    pCO2, Ven 22.0 (*)    pO2, Ven 51.0 (*)    Bicarbonate 4.8 (*)    Acid-base deficit 26.0 (*)    All other components within normal limits  CBG MONITORING, ED - Abnormal; Notable for the following:    Glucose-Capillary >600 (*)    All other components within normal limits  CBG MONITORING, ED - Abnormal; Notable for the following:    Glucose-Capillary >600 (*)    All other components within normal limits  CBG MONITORING, ED - Abnormal; Notable for the following:    Glucose-Capillary >600 (*)    All other components within normal limits  I-STAT ARTERIAL BLOOD GAS, ED - Abnormal; Notable for the following:    pH, Arterial 7.062 (*)    pCO2 arterial 15.2 (*)    pO2, Arterial 259.0 (*)    Bicarbonate 4.3 (*)    Acid-base deficit 24.0 (*)    All other components within normal limits  MRSA PCR SCREENING  CULTURE, BLOOD (ROUTINE X 2)  CULTURE, BLOOD (ROUTINE X 2)  URINE CULTURE  URINE MICROSCOPIC-ADD ON  TROPONIN I  PROCALCITONIN  MAGNESIUM  PHOSPHORUS  LACTIC ACID, PLASMA  TROPONIN I  TROPONIN I  CORTISOL-AM, BLOOD  BASIC METABOLIC PANEL  BASIC METABOLIC PANEL    Imaging Review    EKG Interpretation None      MDM   Final diagnoses:  Diabetic ketoacidosis    53 year old female with history of diabetes, hypertension, hyperlipidemia, coronary artery disease who presents from home for hyperglycemia and altered mental status. Patient tachycardic and tachypnea with vital signs otherwise stable. GCS 10 on arrival. Kussmaul respirations noted.  Concern for DKA.   Plantar glucose greater than 600. Giving IVF and starting insulin gtt.  Venous gas 6.94/22. On CMP glucose 1124, bicarbonate less than 7. Anion gap of at least 39. Labs consistent DKA. Leukocytosis of 27. Lactic acid 9.8. Chest x-ray without evidence of pneumonia and a UA without signs of urinary tract infection.  Due to acidosis and mental status, critical care consulted and will admit for further management.    Cherre Robins, MD 03/08/14 [phone]

## 2014-03-08 NOTE — Procedures (Signed)
Central Venous Catheter Insertion Procedure Note Media Pizzini [id number] 1961-04-04  Procedure: Insertion of Central Venous Catheter Indications: Assessment of intravascular volume, Drug and/or fluid administration and Frequent blood sampling  Procedure Details Consent: Unable to obtain consent because of emergent medical necessity. Time Out: Verified patient identification, verified procedure, site/side was marked, verified correct patient position, special equipment/implants available, medications/allergies/relevent history reviewed, required imaging and test results available.  Performed  Maximum sterile technique was used including antiseptics, cap, gloves, gown, hand hygiene, mask and sheet. Skin prep: Chlorhexidine; local anesthetic administered A antimicrobial bonded/coated triple lumen catheter was placed in the right internal jugular vein using the Seldinger technique. Ultrasound guidance used.yes Catheter placed to 17 cm. Blood aspirated via all 3 ports and then flushed x 3. Line sutured x 3 and dressing applied.  Evaluation Blood flow good Complications: No apparent complications Patient did tolerate procedure well. Chest X-ray ordered to verify placement.  CXR: pending.  Procedure by: R. Virgil Benedict Senay Sistrunk ACNP Adolph Pollack PCCM Pager [phone] till 3 pm If no answer page [phone] 03/08/2014, 1:02 PM

## 2014-03-08 NOTE — ED Notes (Signed)
PCCM at the bedside 

## 2014-03-08 NOTE — Procedures (Signed)
Central Venous Catheter Insertion Procedure Note Madison [name] [id number] 1961-05-03 Initially attempted sterile left IJ, enetr vessel no pass wire, stensosi>? Aborted, no ptx Procedure: Insertion of Central Venous Catheter Indications: Assessment of intravascular volume, Drug and/or fluid administration and Frequent blood sampling  Procedure Details Consent: Unable to obtain consent because of emergent medical necessity. Time Out: Verified patient identification, verified procedure, site/side was marked, verified correct patient position, special equipment/implants available, medications/allergies/relevent history reviewed, required imaging and test results available.  Performed  Maximum sterile technique was used including antiseptics, cap, gloves, gown, hand hygiene, mask and sheet. Skin prep: Chlorhexidine; local anesthetic administered A antimicrobial bonded/coated triple lumen catheter was placed in the right internal jugular vein using the Seldinger technique.  Evaluation Blood flow good Complications: No apparent complications Patient did tolerate procedure well. Chest X-ray ordered to verify placement.  CXR: pending.  Madison [name] 03/08/2014, 1:06 PM  [place]

## 2014-03-08 NOTE — Progress Notes (Addendum)
CCM Interim Progress Note.  Critical lab noted -- > Troponin 0.32, mildly elevated in setting of acute stress  To note, Hb 12.1 --> 10.3 in setting of IVF hydration due to HONK (likely dilutional -- all cell lines declined) Gastric Occult +  Noted that neuro suggests prophylactic anticoag with heparin gtt  Plan: -Repeat CBC now, will hold anticoag for now, first monitor Hb stability -Consult GI in AM for UGI bleed, sooner if hemodynamically unstable -Change Famotidine to Protonix 40mg  IV BID -Cycle CE x 3 (next at 0400)  , MD ([facility], PGY3)  ADDENDUM (5:30a) K 3.2 --> repleted Stacy Gardner IV x 5 Trop 0.32 --> 0.43 (again, likely demand ischemia) Hb stable ~10 in setting of gastric occult +  -Plan to consult GI prior to starting/considering heparin gtt

## 2014-03-08 NOTE — Progress Notes (Signed)
eLink Physician-Brief Progress Note Patient Name: Madison [name] DOB: 04-Mar-1961 MRN: [id number]  Date of Service  03/08/2014   HPI/Events of Note   CT head pos for subacute right frontal infarct  eICU Interventions  Will call neurology consult   Intervention Category Intermediate Interventions: Diagnostic test evaluation  Storm Frisk 03/08/2014, 6:23 PM

## 2014-03-08 NOTE — ED Notes (Signed)
Paged PCCM about pts BP.  

## 2014-03-08 NOTE — Progress Notes (Signed)
CRITICAL VALUE ALERT  Critical value received:  CO2 = 8  Date of notification:  03/08/2014   Time of notification:  .14:13   Critical value read back:yes  Nurse who received alert:  Jacqulyn Cane   MD notified (1st page):  Dr Kendrick Fries  Time of first page:  14:15  MD notified (2nd page):  Time of second page:  Responding MD:  Dr. Kendrick Fries  Time MD responded:  14:15

## 2014-03-08 NOTE — ED Notes (Signed)
Attempted report 

## 2014-03-08 NOTE — ED Notes (Signed)
Unable to place central line at this time. Will attempt again in the Unit

## 2014-03-08 NOTE — Progress Notes (Signed)
Weaned fio2 to 30% per Po2 on ABG result

## 2014-03-08 NOTE — Progress Notes (Signed)
Pt transported to/from CT scan on vent w/ no apparent complications.   

## 2014-03-08 NOTE — H&P (Addendum)
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 Per EMS report patient last seen normal last night. Her son called EMS because of AMS. The patient was found to be unresponsive to painful stimuli. CBG on BMP 1124 in ED. Lactic acid 9.81 and high AG with bicarbonate <7.0. PCCM called for admission. Patient is intubated in ED.   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 Per EMS report patient last seen normal last night. Her son called EMS because she was not acting like herself. On arrival EMS found patient unresponsive to painful stimuli. CBG on BMP 1124 in ED. Lactic acid 9.81 and high AG with bicarbonate <7.0. PCCM called for admission. Patient is intubated in ED.    PAST MEDICAL HISTORY :  Past Medical History  Diagnosis Date  . CAD (coronary artery disease)   . Obesity   . Hypercholesteremia   . HTN (hypertension)   . Dyslipidemia   . Stroke syndrome   . Aortic stenosis   . Thrombophlebitis   . Diabetes   . Hyperlipidemia   . Rheumatoid arthritis(714.0)    Past Surgical History  Procedure Laterality Date  . Cesarean section    . Foot fracture surgery    . Knee surgery    . Neuroplasty / transposition median nerve  at carpal tunnel     Prior to Admission medications   Medication Sig Start Date End Date Taking? Authorizing Provider  aspirin 81 MG tablet Take 81 mg by mouth daily.    Historical Provider, MD  baclofen (LIORESAL) 10 MG tablet Take 1 tablet (10 mg total) by mouth every morning. 02/04/14   Timoteo Gaul, FNP  citalopram (CELEXA) 40 MG tablet Take 1 tablet (40 mg total) by mouth daily. 01/13/14   Timoteo Gaul, FNP  glucose blood (ONE TOUCH ULTRA TEST) test strip 1 each by Other route 4 (four) times daily. And lancets 4/day 250.01 11/30/13   Renato Shin, MD  Insulin Aspart Prot & Aspart (NOVOLOG MIX 70/30 FLEXPEN) (70-30) 100 UNIT/ML Pen Inject 40 Units into the skin daily with breakfast. And pen needles 1/day 02/25/14   Renato Shin, MD  omeprazole (PRILOSEC) 40 MG capsule Take 1 capsule (40 mg total) by mouth daily. 02/04/14   Timoteo Gaul, FNP  pravastatin (PRAVACHOL) 20 MG tablet Take 1 tablet (20 mg total) by mouth daily. 02/04/14   Timoteo Gaul, FNP  solifenacin (VESICARE) 5 MG tablet Take 1/2 tablet once daily 02/05/14   Timoteo Gaul, FNP  triamcinolone cream (KENALOG) 0.1 % Apply topically 2 (two) times daily. 10/02/13   Timoteo Gaul, FNP  warfarin (COUMADIN) 5 MG tablet Take as directed by anticoagulation clinic 02/02/14   Timoteo Gaul, FNP   Allergies  Allergen  Reactions  . Celebrex [Celecoxib] Rash  . Detrol [Tolterodine]     FAMILY HISTORY:  Family History  Problem Relation Age of Onset  . Stroke     SOCIAL HISTORY:  reports that she has never smoked. She does not have any smokeless tobacco history on file. She reports that she does not drink alcohol or use illicit drugs.  REVIEW OF SYSTEMS:  Could not obtained due to AMS  SUBJECTIVE:   VITAL SIGNS: Temp:  [98.9 F (37.2 C)] 98.9 F (37.2 C) (04/20 0831) Pulse Rate:  [125-133] 133 (04/20 1021) Resp:  [23-34] 34 (04/20 1000) BP: (95-129)/(30-79) 99/49 mmHg (04/20 1021) SpO2:  [95 %-100  %] 100 % (04/20 1021) HEMODYNAMICS:   VENTILATOR SETTINGS:   INTAKE / OUTPUT: Intake/Output   None     PHYSICAL EXAMINATION: General: AMS. Lethargic, severe distress HEENT: No JVD Pulm: CTAB  CV: s1/S2, tachycardia, No murmur Abd: soft, non tender, bowel sound active Ext: No leg edema Neuro: not oriented X 3,  grossly no focal Skin: no rash  LABS:  CBC  Recent Labs Lab 03/08/14 0831  WBC 27.0*  HGB 12.1  HCT 38.8  PLT 351   Coag's  Recent Labs Lab 03/08/14 0831  INR 1.37   BMET  Recent Labs Lab 03/08/14 0831  NA 132*  K 7.3*  CL 86*  CO2 <7*  BUN 36*  CREATININE 1.82*  GLUCOSE 1124*   Electrolytes  Recent Labs Lab 03/08/14 0831  CALCIUM 9.4   Sepsis Markers  Recent Labs Lab 03/08/14 0923  LATICACIDVEN 9.81*   ABG No results found for this basename: PHART, PCO2ART, PO2ART,  in the last 168 hours Liver Enzymes  Recent Labs Lab 03/08/14 0831  AST 16  ALT 15  ALKPHOS 133*  BILITOT <0.2*  ALBUMIN 3.7   Cardiac Enzymes No results found for this basename: TROPONINI, PROBNP,  in the last 168 hours Glucose  Recent Labs Lab 03/08/14 0834 03/08/14 0939 03/08/14 1035  GLUCAP >600* >600* >600*    Imaging Dg Chest Portable 1 View  03/08/2014   CLINICAL DATA:  Altered mental status.  Hyperglycemia.  EXAM: PORTABLE CHEST - 1 VIEW  COMPARISON:  None.  FINDINGS: There has been previous median sternotomy and aortic valve replacement. Heart size is normal. The lungs are clear. The vascularity is normal. No effusions.  IMPRESSION: Previous AVR.  No active disease.   Electronically Signed   By: Nelson Chimes M.D.   On: 03/08/2014 09:04     CXR: negative for acute issues  ASSESSMENT / PLAN:  PULMONARY A: acute respiratory failure in the setting of severe DKA P:   - Ventilation support with high MV -rate 35, T V8 cc, abg to follow -treat met acidosis  CARDIOVASCULAR A:  - hx of aortic valve replacement, coumadin on the home med  list, but INR 1.37 on admission - HTN, Bp low 86/54 on admission - 2D echo on 03/02/13-->EF normal - HLD 0-shock, hypovolemia, r/o septic  P:  - hold home Bp meds - IVF: received 1 L NS bolus in ED-->will give 2 L NS blous-->followed by NS 150 cc/h  - Sq heparin - hold home statin due to NPIO - trop q6h x 3 - EKG in AM -place line, cvp, cortisol Follow lactic clearance -May need egdt -levophed to MAP goal 60  RENAL A:   - AKI, cre 1.86, most likely prerenal due to dehydration - UA negative for UTI hyperkalemia P:   - consider renal  US to r/u hydronephrosis if not improving with IVF - IVF: NS 150 cc/h - f/u BMP K  Stat -bicarb for K , until reduced, realizing that she is depleted overall, keep high rate vent  GASTROINTESTINAL A:   -  severe DKA and metobolic acidosis, bicarbonate <7.0  -  Lactic acid 9.81 -  K=7.2 on admission -  hx of GERD on PPI at home  P:   - IVF: NS 150cc/h - bicarbonate gtt until K normalization - Insulin gtt per DKA protocol - BMP q2h  - IV Vanco and unasyn for empiric coverage - trend Lactic acid q8h - PCT protocol to limit abx if no source ID - IV pepcid  HEMATOLOGIC A:   - leukocytosis, WBC 27, most likely 2/2 to DKA / hemoconcetration, needs to r/o infectin -Aortic valve P:  - f/u CBC -sub q heparin, consider heparin once line placed  INFECTIOUS A:  - No clear source of infection, but has leukocytosis (WBC 27), most likely 2/2 to DKA. 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 Titus Mould, MD, [place] Pgr: [facility]  Pulmonary and [place] Pager: [phone]  03/08/2014, 10:50 AM

## 2014-03-08 NOTE — Progress Notes (Signed)
ANTIBIOTIC CONSULT NOTE - INITIAL  Pharmacy Consult for Vancomycin and Unasyn Indication: rule out pneumonia  Allergies  Allergen Reactions  . Celebrex [Celecoxib] Rash  . Detrol [Tolterodine]     Patient Measurements:   Wt Readings from Last 3 Encounters:  01/11/14 155 lb (70.308 kg)  12/31/13 153 lb (69.4 kg)  12/16/13 153 lb (69.4 kg)     Vital Signs: Temp: 98.9 F (37.2 C) (04/20 0831) Temp src: Rectal (04/20 0831) BP: 99/49 mmHg (04/20 1021) Pulse Rate: 133 (04/20 1021) Intake/Output from previous day:   Intake/Output from this shift:    Labs:  Recent Labs  03/08/14 0831  WBC 27.0*  HGB 12.1  PLT 351  CREATININE 1.82*   The CrCl is unknown because both a height and weight (above a minimum accepted value) are required for this calculation. No results found for this basename: VANCOTROUGH, VANCOPEAK, VANCORANDOM, GENTTROUGH, GENTPEAK, GENTRANDOM, TOBRATROUGH, TOBRAPEAK, TOBRARND, AMIKACINPEAK, AMIKACINTROU, AMIKACIN,  in the last 72 hours   Microbiology: No results found for this or any previous visit (from the past 720 hour(s)).  Medical History: Past Medical History  Diagnosis Date  . CAD (coronary artery disease)   . Obesity   . Hypercholesteremia   . HTN (hypertension)   . Dyslipidemia   . Stroke syndrome   . Aortic stenosis   . Thrombophlebitis   . Diabetes   . Hyperlipidemia   . Rheumatoid arthritis(714.0)     Medications:  Anti-infectives   None     Assessment: 53 year old female admitted with DKA.  She is to begin empiric antibiotic coverage with Vancomycin and Unasyn.  Note her creatinine is markedly elevated likely due to dehydration associated with DKA.  Goal of Therapy:  Vancomycin trough level 15-20 mcg/ml  Plan:  Vancomycin 750mg  IV q12h Unasyn 3gm IV q6h Monitor renal function closely Follow available micro data  , Pharm.D., BCPS, AAHIVP Clinical Pharmacist Phone [phone] or [phone] 03/08/2014, 11:00  AM

## 2014-03-09 DIAGNOSIS — I059 Rheumatic mitral valve disease, unspecified: Secondary | ICD-10-CM

## 2014-03-09 DIAGNOSIS — E1110 Type 2 diabetes mellitus with ketoacidosis without coma: Secondary | ICD-10-CM

## 2014-03-09 DIAGNOSIS — I6350 Cerebral infarction due to unspecified occlusion or stenosis of unspecified cerebral artery: Secondary | ICD-10-CM

## 2014-03-09 LAB — PROTIME-INR
INR: 1.9 — AB (ref 0.00–1.49)
Prothrombin Time: 21.2 seconds — ABNORMAL HIGH (ref 11.6–15.2)

## 2014-03-09 LAB — BASIC METABOLIC PANEL
BUN: 21 mg/dL (ref 6–23)
BUN: 18 mg/dL (ref 6–23)
BUN: 24 mg/dL — AB (ref 6–23)
BUN: 9 mg/dL (ref 6–23)
CALCIUM: 8 mg/dL — AB (ref 8.4–10.5)
CALCIUM: 7.8 mg/dL — AB (ref 8.4–10.5)
CALCIUM: 7.8 mg/dL — AB (ref 8.4–10.5)
CHLORIDE: 109 meq/L (ref 96–112)
CO2: 17 meq/L — AB (ref 19–32)
CO2: 14 mEq/L — ABNORMAL LOW (ref 19–32)
CO2: 12 mEq/L — ABNORMAL LOW (ref 19–32)
CO2: 14 meq/L — AB (ref 19–32)
CREATININE: 0.87 mg/dL (ref 0.50–1.10)
Calcium: 7.9 mg/dL — ABNORMAL LOW (ref 8.4–10.5)
Chloride: 113 mEq/L — ABNORMAL HIGH (ref 96–112)
Chloride: 111 mEq/L (ref 96–112)
Chloride: 111 mEq/L (ref 96–112)
Creatinine, Ser: 1.08 mg/dL (ref 0.50–1.10)
Creatinine, Ser: 1.07 mg/dL (ref 0.50–1.10)
Creatinine, Ser: 0.96 mg/dL (ref 0.50–1.10)
GFR calc Af Amer: 67 mL/min — ABNORMAL LOW (ref >90)
GFR calc non Af Amer: 58 mL/min — ABNORMAL LOW (ref >90)
GFR calc non Af Amer: 66 mL/min — ABNORMAL LOW (ref >90)
GFR, EST AFRICAN AMERICAN: 67 mL/min — AB (ref >90)
GFR, EST AFRICAN AMERICAN: 77 mL/min — AB (ref >90)
GFR, EST AFRICAN AMERICAN: 87 mL/min — AB (ref >90)
GFR, EST NON AFRICAN AMERICAN: 58 mL/min — AB (ref >90)
GFR, EST NON AFRICAN AMERICAN: 75 mL/min — AB (ref >90)
GLUCOSE: 179 mg/dL — AB (ref 70–99)
Glucose, Bld: 180 mg/dL — ABNORMAL HIGH (ref 70–99)
Glucose, Bld: 181 mg/dL — ABNORMAL HIGH (ref 70–99)
Glucose, Bld: 213 mg/dL — ABNORMAL HIGH (ref 70–99)
POTASSIUM: 3.7 meq/L (ref 3.7–5.3)
Potassium: 4.4 mEq/L (ref 3.7–5.3)
Potassium: 3.2 mEq/L — ABNORMAL LOW (ref 3.7–5.3)
Potassium: 3.9 mEq/L (ref 3.7–5.3)
SODIUM: 142 meq/L (ref 137–147)
SODIUM: 141 meq/L (ref 137–147)
Sodium: 139 mEq/L (ref 137–147)
Sodium: 137 mEq/L (ref 137–147)

## 2014-03-09 LAB — CBC
HCT: 29.6 % — ABNORMAL LOW (ref 36.0–46.0)
HCT: 29.6 % — ABNORMAL LOW (ref 36.0–46.0)
HEMATOCRIT: 27.6 % — AB (ref 36.0–46.0)
HEMOGLOBIN: 9.7 g/dL — AB (ref 12.0–15.0)
Hemoglobin: 10.3 g/dL — ABNORMAL LOW (ref 12.0–15.0)
Hemoglobin: 10.4 g/dL — ABNORMAL LOW (ref 12.0–15.0)
MCH: 28.3 pg (ref 26.0–34.0)
MCH: 28.9 pg (ref 26.0–34.0)
MCH: 29.3 pg (ref 26.0–34.0)
MCHC: 34.8 g/dL (ref 30.0–36.0)
MCHC: 35.1 g/dL (ref 30.0–36.0)
MCHC: 35.1 g/dL (ref 30.0–36.0)
MCV: 81.3 fL (ref 78.0–100.0)
MCV: 82.2 fL (ref 78.0–100.0)
MCV: 83.4 fL (ref 78.0–100.0)
PLATELETS: 212 10*3/uL (ref 150–400)
Platelets: 203 10*3/uL (ref 150–400)
Platelets: 146 10*3/uL — ABNORMAL LOW (ref 150–400)
RBC: 3.31 MIL/uL — ABNORMAL LOW (ref 3.87–5.11)
RBC: 3.6 MIL/uL — AB (ref 3.87–5.11)
RBC: 3.64 MIL/uL — AB (ref 3.87–5.11)
RDW: 13 % (ref 11.5–15.5)
RDW: 13 % (ref 11.5–15.5)
RDW: 13.7 % (ref 11.5–15.5)
WBC: 11.4 10*3/uL — ABNORMAL HIGH (ref 4.0–10.5)
WBC: 15.9 10*3/uL — AB (ref 4.0–10.5)
WBC: 17.2 10*3/uL — ABNORMAL HIGH (ref 4.0–10.5)

## 2014-03-09 LAB — GLUCOSE, CAPILLARY
GLUCOSE-CAPILLARY: 570 mg/dL — AB (ref 70–99)
GLUCOSE-CAPILLARY: 189 mg/dL — AB (ref 70–99)
GLUCOSE-CAPILLARY: 105 mg/dL — AB (ref 70–99)
GLUCOSE-CAPILLARY: 167 mg/dL — AB (ref 70–99)
GLUCOSE-CAPILLARY: 199 mg/dL — AB (ref 70–99)
GLUCOSE-CAPILLARY: 177 mg/dL — AB (ref 70–99)
GLUCOSE-CAPILLARY: 172 mg/dL — AB (ref 70–99)
GLUCOSE-CAPILLARY: 174 mg/dL — AB (ref 70–99)
Glucose-Capillary: 126 mg/dL — ABNORMAL HIGH (ref 70–99)
Glucose-Capillary: 145 mg/dL — ABNORMAL HIGH (ref 70–99)
Glucose-Capillary: 178 mg/dL — ABNORMAL HIGH (ref 70–99)
Glucose-Capillary: 170 mg/dL — ABNORMAL HIGH (ref 70–99)
Glucose-Capillary: 176 mg/dL — ABNORMAL HIGH (ref 70–99)
Glucose-Capillary: 134 mg/dL — ABNORMAL HIGH (ref 70–99)
Glucose-Capillary: 176 mg/dL — ABNORMAL HIGH (ref 70–99)
Glucose-Capillary: 131 mg/dL — ABNORMAL HIGH (ref 70–99)
Glucose-Capillary: 144 mg/dL — ABNORMAL HIGH (ref 70–99)
Glucose-Capillary: 151 mg/dL — ABNORMAL HIGH (ref 70–99)
Glucose-Capillary: 118 mg/dL — ABNORMAL HIGH (ref 70–99)
Glucose-Capillary: 122 mg/dL — ABNORMAL HIGH (ref 70–99)
Glucose-Capillary: 204 mg/dL — ABNORMAL HIGH (ref 70–99)
Glucose-Capillary: 243 mg/dL — ABNORMAL HIGH (ref 70–99)

## 2014-03-09 LAB — BASIC METABOLIC PANEL WITH GFR
BUN: 14 mg/dL (ref 6–23)
BUN: 11 mg/dL (ref 6–23)
CO2: 17 meq/L — ABNORMAL LOW (ref 19–32)
CO2: 17 meq/L — ABNORMAL LOW (ref 19–32)
Calcium: 7.7 mg/dL — ABNORMAL LOW (ref 8.4–10.5)
Calcium: 7.7 mg/dL — ABNORMAL LOW (ref 8.4–10.5)
Chloride: 110 meq/L (ref 96–112)
Chloride: 110 meq/L (ref 96–112)
Creatinine, Ser: 0.95 mg/dL (ref 0.50–1.10)
Creatinine, Ser: 0.85 mg/dL (ref 0.50–1.10)
GFR calc Af Amer: 78 mL/min — ABNORMAL LOW
GFR calc Af Amer: 89 mL/min — ABNORMAL LOW
GFR calc non Af Amer: 67 mL/min — ABNORMAL LOW
GFR calc non Af Amer: 77 mL/min — ABNORMAL LOW
Glucose, Bld: 163 mg/dL — ABNORMAL HIGH (ref 70–99)
Glucose, Bld: 251 mg/dL — ABNORMAL HIGH (ref 70–99)
Potassium: 4 meq/L (ref 3.7–5.3)
Potassium: 4.1 meq/L (ref 3.7–5.3)
Sodium: 140 meq/L (ref 137–147)
Sodium: 138 meq/L (ref 137–147)

## 2014-03-09 LAB — MAGNESIUM
MAGNESIUM: 1.5 mg/dL (ref 1.5–2.5)
Magnesium: 2.2 mg/dL (ref 1.5–2.5)

## 2014-03-09 LAB — URINE CULTURE
CULTURE: NO GROWTH
Colony Count: NO GROWTH
Special Requests: NORMAL

## 2014-03-09 LAB — PROCALCITONIN: Procalcitonin: 2.07 ng/mL

## 2014-03-09 LAB — TROPONIN I
TROPONIN I: 0.43 ng/mL — AB (ref <0.30)
TROPONIN I: 0.52 ng/mL — AB (ref <0.30)
Troponin I: 0.31 ng/mL

## 2014-03-09 LAB — PHOSPHORUS
Phosphorus: 1.5 mg/dL — ABNORMAL LOW (ref 2.3–4.6)
Phosphorus: 2.3 mg/dL (ref 2.3–4.6)

## 2014-03-09 LAB — APTT: aPTT: 37 seconds (ref 24–37)

## 2014-03-09 LAB — HCG, QUANTITATIVE, PREGNANCY: hCG, Beta Chain, Quant, S: 3 m[IU]/mL (ref <5)

## 2014-03-09 LAB — LACTIC ACID, PLASMA: Lactic Acid, Venous: 2 mmol/L (ref 0.5–2.2)

## 2014-03-09 LAB — HEPARIN LEVEL (UNFRACTIONATED): Heparin Unfractionated: 0.73 IU/mL — ABNORMAL HIGH (ref 0.30–0.70)

## 2014-03-09 MED ORDER — DEXTROSE 5 % IV SOLN
20.0000 meq | Freq: Once | INTRAVENOUS | Status: AC
Start: 2014-03-09 — End: 2014-03-09
  Administered 2014-03-09: 20 meq via INTRAVENOUS
  Filled 2014-03-09: qty 4.55

## 2014-03-09 MED ORDER — MAGNESIUM SULFATE 40 MG/ML IJ SOLN
2.0000 g | Freq: Once | INTRAMUSCULAR | Status: AC
Start: 2014-03-09 — End: 2014-03-09
  Administered 2014-03-09: 2 g via INTRAVENOUS
  Filled 2014-03-09: qty 50

## 2014-03-09 MED ORDER — POTASSIUM CHLORIDE 20 MEQ/15ML (10%) PO LIQD: 40.0000 meq | ORAL | Status: DC

## 2014-03-09 MED ORDER — RAMELTEON 8 MG PO TABS
8.0000 mg | ORAL_TABLET | Freq: Every day | ORAL | Status: DC
  Administered 2014-03-09 – 2014-03-11 (×3): 8 mg via ORAL
  Filled 2014-03-09 (×4): qty 1

## 2014-03-09 MED ORDER — WARFARIN - PHARMACIST DOSING INPATIENT: Freq: Every day | Status: DC

## 2014-03-09 MED ORDER — PANTOPRAZOLE SODIUM 40 MG IV SOLR
40.0000 mg | INTRAVENOUS | Status: DC
  Administered 2014-03-10 – 2014-03-11 (×2): 40 mg via INTRAVENOUS
  Filled 2014-03-09 (×3): qty 40

## 2014-03-09 MED ORDER — INSULIN DETEMIR 100 UNIT/ML ~~LOC~~ SOLN
10.0000 [IU] | Freq: Once | SUBCUTANEOUS | Status: AC
  Administered 2014-03-09: 10 [IU] via SUBCUTANEOUS
  Filled 2014-03-09: qty 0.1

## 2014-03-09 MED ORDER — POTASSIUM CHLORIDE 10 MEQ/50ML IV SOLN
10.0000 meq | INTRAVENOUS | Status: AC
  Administered 2014-03-09 (×5): 10 meq via INTRAVENOUS
  Filled 2014-03-09 (×3): qty 50

## 2014-03-09 MED ORDER — HEPARIN (PORCINE) IN NACL 100-0.45 UNIT/ML-% IJ SOLN
850.0000 [IU]/h | INTRAMUSCULAR | Status: DC
  Administered 2014-03-09: 950 [IU]/h via INTRAVENOUS
  Filled 2014-03-09 (×3): qty 250

## 2014-03-09 MED ORDER — INSULIN DETEMIR 100 UNIT/ML ~~LOC~~ SOLN
10.0000 [IU] | Freq: Every day | SUBCUTANEOUS | Status: DC
  Administered 2014-03-10: 10 [IU] via SUBCUTANEOUS
  Filled 2014-03-09 (×2): qty 0.1

## 2014-03-09 MED ORDER — WARFARIN SODIUM 2.5 MG PO TABS
2.5000 mg | ORAL_TABLET | Freq: Once | ORAL | Status: AC
  Administered 2014-03-09: 2.5 mg via ORAL
  Filled 2014-03-09: qty 1

## 2014-03-09 MED ORDER — INSULIN ASPART 100 UNIT/ML ~~LOC~~ SOLN
0.0000 [IU] | SUBCUTANEOUS | Status: DC
  Administered 2014-03-09: 8 [IU] via SUBCUTANEOUS
  Administered 2014-03-09: 2 [IU] via SUBCUTANEOUS
  Administered 2014-03-10: 8 [IU] via SUBCUTANEOUS
  Administered 2014-03-10: 20 [IU] via SUBCUTANEOUS

## 2014-03-09 NOTE — Evaluation (Signed)
Physical Therapy Evaluation Patient Details Name: Madison [name] MRN: [id number] DOB: 03/09/1961 Today's Date: 03/09/2014   History of Present Illness  pt presents with DKA, Encephalopathy, Anterior Frontal Ischemic Infarct, with hx of R MCA Infarct.    Clinical Impression  Pt seems motivated to mobilize and follows directions well.  Pt does have some mild cognitive deficits and difficulties with speech.  Pt's O2 sats remained 98-100% during mobility and BP ranged 99/54 - 124/60.  Feel pt would benefit from CIR at D/C.      Follow Up Recommendations CIR    Equipment Recommendations   (TBD)    Recommendations for Other Services       Precautions / Restrictions Precautions Precautions: Fall Restrictions Weight Bearing Restrictions: No      Mobility  Bed Mobility Overal bed mobility: Needs Assistance Bed Mobility: Supine to Sit;Sit to Supine     Supine to sit: Min assist;HOB elevated Sit to supine: Min assist   General bed mobility comments: pt moves slowly and needs A to bring trunk up to sitting and LEs back to bed.    Transfers Overall transfer level: Needs assistance Equipment used: 1 person hand held assist Transfers: Sit to/from Stand Sit to Stand: Min assist         General transfer comment: cues for UE use.    Ambulation/Gait                Stairs            Wheelchair Mobility    Modified Rankin (Stroke Patients Only)       Balance Overall balance assessment: Needs assistance         Standing balance support: Single extremity supported Standing balance-Leahy Scale: Poor                               Pertinent Vitals/Pain Denied pain.      Home Living Family/patient expects to be discharged to:: Inpatient rehab                      Prior Function Level of Independence: Independent               Hand Dominance        Extremity/Trunk Assessment   Upper Extremity Assessment: Defer to OT  evaluation           Lower Extremity Assessment: Generalized weakness;LLE deficits/detail   LLE Deficits / Details: L hemi from previous CVA.  weakness in ankle/foot.    Cervical / Trunk Assessment: Normal  Communication   Communication: Expressive difficulties  Cognition Arousal/Alertness: Awake/alert Behavior During Therapy: WFL for tasks assessed/performed Overall Cognitive Status: Impaired/Different from baseline Area of Impairment: Attention;Memory;Awareness;Problem solving   Current Attention Level: Selective Memory: Decreased short-term memory     Awareness: Emergent Problem Solving: Slow processing;Difficulty sequencing;Requires verbal cues;Requires tactile cues      General Comments      Exercises        Assessment/Plan    PT Assessment Patient needs continued PT services  PT Diagnosis Difficulty walking   PT Problem List Decreased strength;Decreased activity tolerance;Decreased balance;Decreased mobility;Decreased coordination;Decreased cognition;Decreased knowledge of use of DME  PT Treatment Interventions DME instruction;Gait training;Stair training;Functional mobility training;Therapeutic activities;Therapeutic exercise;Balance training;Neuromuscular re-education;Cognitive remediation;Patient/family education   PT Goals (Current goals can be found in the Care Plan section) Acute Rehab PT Goals Patient Stated Goal: None stated PT Goal Formulation: With patient  Time For Goal Achievement: 03/23/14 Potential to Achieve Goals: Good    Frequency Min 4X/week   Barriers to discharge        Co-evaluation               End of Session Equipment Utilized During Treatment: Gait belt;Oxygen Activity Tolerance: Patient limited by fatigue Patient left: in bed;with call bell/phone within reach Nurse Communication: Mobility status         Time: 2229-7989 PT Time Calculation (min): 23 min   Charges:   PT Evaluation $Initial PT Evaluation Tier I:  1 Procedure PT Treatments $Therapeutic Activity: 8-22 mins   PT G CodesSunny Schlein, [place] [phone] 03/09/2014, 3:01 PM

## 2014-03-09 NOTE — Progress Notes (Signed)
Gastroccult sent around 7 pm. Lab result positive for blood. Dr. Everardo Beals aware.

## 2014-03-09 NOTE — Progress Notes (Signed)
CRITICAL VALUE ALERT  Critical value received:  Trop .32  Date of notification:  03/08/14   Time of notification:  2311  Critical value read back:yes  Nurse who received alert:  Crist Fat RN  MD notified (1st page):  Dr. Everardo Beals  Time of first page:  Spoke to her in person on unit at 2340  MD notified (2nd page):  Time of second page:  Responding MD:  na  Time MD responded:  2340

## 2014-03-09 NOTE — Progress Notes (Signed)
ANTICOAGULATION CONSULT NOTE - Initial Consult  Pharmacy Consult for Heparin and Coumadin Bridge Indication: Aortic Valve on coumadin PTA  Allergies  Allergen Reactions  . Celebrex [Celecoxib] Rash  . Detrol [Tolterodine]     Patient Measurements: Height: 5\' 8"  (172.7 cm) Weight: 173 lb 8 oz (78.7 kg) IBW/kg (Calculated) : 63.9 Heparin Dosing Weight:   Vital Signs: Temp: 100.3 F (37.9 C) (04/21 0755) BP: 136/59 mmHg (04/21 0755) Pulse Rate: 100 (04/21 0755)  Labs:  Recent Labs  03/08/14 0831  03/08/14 1646 03/08/14 1900 03/08/14 2210 03/09/14 0004 03/09/14 0415 03/09/14 0416  HGB 12.1  --   --  10.3*  --  10.4* 10.3*  --   HCT 38.8  --   --  30.4*  --  29.6* 29.6*  --   PLT 351  --   --  253  --  212 203  --   APTT  --   --   --   --   --   --  37  --   LABPROT 16.5*  --   --   --   --   --  21.2*  --   INR 1.37  --   --   --   --   --  1.90*  --   CREATININE 1.82*  < > 1.38* 1.18* 1.11* 1.08 1.07  --   CKTOTAL  --   --   --  155  --   --   --   --   TROPONINI  --   < > <0.30  --  0.32*  --   --  0.43*  < > = values in this interval not displayed.  Estimated Creatinine Clearance: 67 ml/min (by C-G formula based on Cr of 1.07).   Medical History: Past Medical History  Diagnosis Date  . CAD (coronary artery disease)   . Obesity   . Hypercholesteremia   . HTN (hypertension)   . Dyslipidemia   . Stroke syndrome   . Aortic stenosis   . Thrombophlebitis   . Diabetes   . Hyperlipidemia   . Rheumatoid arthritis(714.0)     Medications:  Prescriptions prior to admission  Medication Sig Dispense Refill  . aspirin 81 MG tablet Take 81 mg by mouth daily.      . baclofen (LIORESAL) 10 MG tablet Take 1 tablet (10 mg total) by mouth every morning.  90 each  0  . citalopram (CELEXA) 40 MG tablet Take 1 tablet (40 mg total) by mouth daily.  90 tablet  1  . glucose blood (ONE TOUCH ULTRA TEST) test strip 1 each by Other route 4 (four) times daily. And lancets 4/day  250.01  360 each  12  . Insulin Aspart Prot & Aspart (NOVOLOG MIX 70/30 FLEXPEN) (70-30) 100 UNIT/ML Pen Inject 40 Units into the skin daily with breakfast. And pen needles 1/day  15 pen  3  . omeprazole (PRILOSEC) 40 MG capsule Take 1 capsule (40 mg total) by mouth daily.  90 capsule  3  . pravastatin (PRAVACHOL) 20 MG tablet Take 1 tablet (20 mg total) by mouth daily.  90 tablet  0  . solifenacin (VESICARE) 5 MG tablet Take 1/2 tablet once daily  45 tablet  0  . triamcinolone cream (KENALOG) 0.1 % Apply topically 2 (two) times daily.  30 g  1  . warfarin (COUMADIN) 5 MG tablet Take as directed by anticoagulation clinic  10 tablet  0    Assessment:  85 yoF, presented with AMS, hyperglycemia, and unresponsive. Patient was intubated in ED and transferred to ICU.  Patient has a history of aortic valve replacement and on coumadin prior to admission.  She is to start on heparin with coumadin bridge.  CT head was positive for subacute right frontal infarct.  Patient also has an occult blood positive, but hemoglobin and hematicrit has been low stable at 10.3/29.3 with normal platelets.  Today's INR is 1.90.  Last coumadin dose per anticoagulation note on 03/01/2014 was 5 mg MWF, 2.5 mg other days with an INR of 1.1, and questionable compliance.    Goal of Therapy:  INR 2-3 Monitor platelets by anticoagulation protocol: Yes HL goal 0.3-0.7   Plan:  Stop SQ heparin Start Heparin drip with no bolus; Heparin drip at 950 units/hr (~12 units/kg) Start Coumadin 2.5mg  x1 tonight Monitor HL in 8 hours at 1700 Daily HL, CBC, INR  Anabel Bene, PharmD Clinical Pharmacist Pager: [phone] Anabel Bene 03/09/2014,8:37 AM

## 2014-03-09 NOTE — Progress Notes (Signed)
*  PRELIMINARY RESULTS* Echocardiogram 2D Echocardiogram has been performed.  Madison [name] 03/09/2014, 11:51 AM

## 2014-03-09 NOTE — Progress Notes (Signed)
Patient's brother in law at bedside now. States patient seems pretty close to baseline although more confused than normal. Patient is able to walk "carefully" at home with a cane. Patient is able to feed her self and her son at home, however does have home aide that comes in to help a few times a week.   Garald Braver [facility]

## 2014-03-09 NOTE — Procedures (Signed)
Extubation Procedure Note  Patient Details:   Name: Madison [name] DOB: 07-Jul-1961 MRN: [id number]   Airway Documentation:   + air leak around cuff prior to extubation.   Evaluation  O2 sats: stable throughout Complications: No apparent complications Patient did tolerate procedure well. Bilateral Breath Sounds: Clear;Diminished Suctioning: Oral Yes, pt able to speak.  No stridor noted, pt denies SOB presently.    Regan Rakers 03/09/2014, 10:10 AM

## 2014-03-09 NOTE — Progress Notes (Signed)
Stroke Team Progress Note  HISTORY Madison [name] is an 53 y.o. female history of diabetes mellitus, hypertension, hyperlipidemia, previous stroke, DVT and aortic valve replacement, subtherapeutic on Coumadin, who was admitted 03/08/2014 for acute respiratory failure and DKA. Patient was intubated in the emergency room. Initial blood sugar was 1124. Patient was found unresponsive by family members. She was last seen well on the evening of 03/07/2014. 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 INR on admission was 1.37. Patient was not administered TPA secondary to delay in arrival. She was admitted to the ICU for further evaluation and treatment.  SUBJECTIVE Her RN is at the bedside.  Overall she feels her condition is rapidly improving.   OBJECTIVE Most recent Vital Signs: Filed Vitals:   03/09/14 0800 03/09/14 0815 03/09/14 0830 03/09/14 0845  BP: 132/56 141/52 139/53 131/52  Pulse: 103 108 113 110  Temp: 100.4 F (38 C) 100.5 F (38.1 C) 100.4 F (38 C) 100.4 F (38 C)  TempSrc: Core (Comment)     Resp: 10 17 16 19   Height:      Weight:      SpO2: 94% 98% 99% 100%   CBG (last 3)   Recent Labs  03/09/14 0204 03/09/14 0307 03/09/14 0418  GLUCAP 170* 189* 176*    IV Fluid Intake:   . sodium chloride    . dextrose 5 % and 0.45% NaCl 150 mL/hr at 03/09/14 0840  . fentaNYL infusion INTRAVENOUS Stopped (03/09/14 0745)  . heparin    . insulin (NOVOLIN-R) infusion 4.6 Units/hr (03/09/14 0945)  . norepinephrine (LEVOPHED) Adult infusion 4 mcg/min (03/09/14 0849)    MEDICATIONS  . ampicillin-sulbactam (UNASYN) IV  3 g Intravenous Q6H  . antiseptic oral rinse  15 mL Mouth Rinse QID  . chlorhexidine  15 mL Mouth Rinse BID  . [START ON 03/10/2014] pantoprazole (PROTONIX) IV  40 mg Intravenous Q24H  . potassium chloride  10 mEq Intravenous Q1 Hr x 5  . sodium chloride  3 mL Intravenous Q12H   . vancomycin  750 mg Intravenous Q12H  . warfarin  2.5 mg Oral ONCE-1800  . Warfarin - Pharmacist Dosing Inpatient   Does not apply q1800   PRN:  dextrose, sodium chloride  Diet:  NPO  Activity:  Bedrest DVT Prophylaxis:  Heparin 5000 units sq tid   CLINICALLY SIGNIFICANT STUDIES Basic Metabolic Panel:  Recent Labs Lab 03/08/14 1646  03/09/14 0415 03/09/14 0800  NA 146  < > 142 139  K 3.4*  < > 3.2* 3.7  CL 113*  < > 111 109  CO2 12*  < > 14* 12*  GLUCOSE 304*  < > 180* 181*  BUN 30*  < > 21 18  CREATININE 1.38*  < > 1.07 0.96  CALCIUM 8.2*  < > 7.8* 7.9*  MG 2.0  --  1.5  --   PHOS 1.0*  --  1.5*  --   < > = values in this interval not displayed. Liver Function Tests:   Recent Labs Lab 03/08/14 0831  AST 16  ALT 15  ALKPHOS 133*  BILITOT <0.2*  PROT 7.5  ALBUMIN 3.7   CBC:   Recent Labs Lab 03/09/14 0004 03/09/14 0415  WBC 17.2* 15.9*  HGB 10.4* 10.3*  HCT 29.6* 29.6*  MCV 82.2 81.3  PLT 212 203   Coagulation:   Recent Labs Lab 03/08/14 0831 03/09/14 0415  LABPROT 16.5* 21.2*  INR 1.37 1.90*   Cardiac Enzymes:   Recent Labs Lab 03/08/14 1646 03/08/14 1900 03/08/14 2210 03/09/14 0416  CKTOTAL  --  155  --   --   TROPONINI <0.30  --  0.32* 0.43*   Urinalysis:   Recent Labs Lab 03/08/14 0840 03/08/14 1320  COLORURINE YELLOW YELLOW  LABSPEC 1.027 1.025  PHURINE 5.0 5.0  GLUCOSEU >1000* >1000*  HGBUR NEGATIVE MODERATE*  BILIRUBINUR NEGATIVE MODERATE*  KETONESUR 40* 40*  PROTEINUR NEGATIVE 30*  UROBILINOGEN 0.2 1.0  NITRITE NEGATIVE NEGATIVE  LEUKOCYTESUR NEGATIVE NEGATIVE   Lipid Panel No results found for this basename: chol,  trig,  hdl,  cholhdl,  vldl,  ldlcalc   HgbA1C  Lab Results  Component Value Date   HGBA1C 10.2* 11/30/2013    Urine Drug Screen:   No results found for this basename: labopia,  cocainscrnur,  labbenz,  amphetmu,  thcu,  labbarb    Alcohol Level: No results found for this basename: ETH,  in the  last 168 hours  CT of the brain  03/08/2014   Extensive chronic ischemic changes as described. 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     MRI of the brain    MRA of the brain    2D Echocardiogram    Carotid Doppler    CXR  03/08/2014    Right central line tip at the cavoatrial junction.  No pneumothorax.  Mild hyperinflation.  No acute cardiopulmonary disease.    03/08/2014   No acute abnormalities.    03/08/2014  Previous AVR.  No active disease.  EKG  sinus tachycardia. For complete results please see formal report.   Therapy Recommendations   Physical Exam   Patient just got extubated. Awake alert. Afebrile. Head is nontraumatic. Neck is supple without bruit. Hearing is normal. Cardiac exam no murmur or gallop. Lungs are clear to auscultation. Distal pulses are well felt. Neurological Exam : Awake alert oriented x2. Follows simple commands. Speech is slow but fluent. No dysarthria. Extraocular movements are full range without nystagmus. Blinks to threat bilaterally. Vision acuity seems adequate. Fundi were not visualized. Mild left lower facial weakness. Tongue midline. No upper or lower extremity drift. Mild weakness of left grip and intrinsic hand muscles. Next distant testing is symmetric. Gait was not tested . ASSESSMENT [name] is a 53 y.o. female presenting with acute onset encephalopathy and respiratory failure associated with DKA. CT done shows acute right frontal infarct and old right parietal, left parietal and left occipital infarcts. Infarcts felt to be embolic secondary to artificial valve, as pt on aspirin 81 mg orally every day and warfarin prior to admission; INR subtherapeutic at 1.37 on arrival. Now on warfarin and heparin for secondary stroke prevention.  Stroke work up underway.   VDRF, resolved Hypertension  86/54 on admission  Hyperlipidemia, LDL no drawn, on pravachol 20 mg ddaily PTA, now on no statin, goal LDL < 100 (< 70  for diabetics) Diabetes, HgbA1c pending, goal < 7.0 Aortic stenosis s/p valve replacement on coumadin PTA Hx stroke   CAD - CABG, NSTEMI on arrival, trop 0.32->0.43  Family hx stroke  RA  Hospital day # 1  TREATMENT/PLAN  Continue warfarin and heparin bridge for secondary stroke prevention. Typically do not use heparin in acute stroke due to hemorrhage risk, however, in this situation the benefit outweighs the risk given mechanical heart valve being very high risk situation for stroke.  Resume  low dose aspirin at discharge  Resume statin when able to swallow  F/u 2D, Carotids  Therapy evals   Annie Main, MSN, RN, ANVP-BC, AGPCNP-BC [facility] Pager: [phone] 03/09/2014 10:04 AM  I have personally obtained a history, examined the patient, evaluated imaging results, and formulated the assessment and plan of care. I agree with the above. Delia Heady, MD   To contact Stroke Continuity provider, please refer to [email or web address]. After hours, contact General Neurology

## 2014-03-09 NOTE — Consult Note (Signed)
Physical Medicine and Rehabilitation Consult Reason for Consult: CVA Referring Physician: Critical care   HPI: Madison [name] is a 53 y.o. right-handed female with history of diabetes mellitus, hypertension, CAD with CABG as well as aortic valve replacement on chronic Coumadin. Admitted 03/08/2014 with altered mental status, hypoglycemia and acute respiratory failure. Patient was intubated in the emergency room. Noted blood sugar of 1124 as well as potassium 7.2. 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 INR on admission of 1.37. Echocardiogram and carotid Dopplers are pending. Critical medicine consulted for severe DKA metabolic acidosis hyperkalemia. Placed on intravenous fluids. Patient was extubated 03/09/2014. Neurology services consulted patient presently remains on Coumadin therapy  with heparin bridge for secondary stroke prevention. Maintained on a dysphagia 3 thin liquid diet. Physical therapy evaluation completed 03/09/2014 with recommendations for physical medicine rehabilitation consult.  Patient states that prior to admission she is functioning on independent level with dressing and bathing however did have a shorter 20 hours per week for cooking as well as cleaning. Her son is a Consulting civil engineer and is gone all day but his home in the evening  Review of Systems  Gastrointestinal: Positive for constipation.  Musculoskeletal: Positive for joint pain and myalgias.  All other systems reviewed and are negative.  Past Medical History  Diagnosis Date  . CAD (coronary artery disease)   . Obesity   . Hypercholesteremia   . HTN (hypertension)   . Dyslipidemia   . Stroke syndrome   . Aortic stenosis   . Thrombophlebitis   . Diabetes   . Hyperlipidemia   . Rheumatoid arthritis(714.0)    Past Surgical History  Procedure Laterality Date  . Cesarean section    . Foot fracture surgery    . Knee surgery    .  Neuroplasty / transposition median nerve at carpal tunnel     Family History  Problem Relation Age of Onset  . Stroke     Social History:  reports that she has never smoked. She does not have any smokeless tobacco history on file. She reports that she does not drink alcohol or use illicit drugs. Allergies:  Allergies  Allergen Reactions  . Celebrex [Celecoxib] Rash  . Detrol [Tolterodine]    Medications Prior to Admission  Medication Sig Dispense Refill  . aspirin 81 MG tablet Take 81 mg by mouth daily.      . baclofen (LIORESAL) 10 MG tablet Take 1 tablet (10 mg total) by mouth every morning.  90 each  0  . citalopram (CELEXA) 40 MG tablet Take 1 tablet (40 mg total) by mouth daily.  90 tablet  1  . glucose blood (ONE TOUCH ULTRA TEST) test strip 1 each by Other route 4 (four) times daily. And lancets 4/day 250.01  360 each  12  . Insulin Aspart Prot & Aspart (NOVOLOG MIX 70/30 FLEXPEN) (70-30) 100 UNIT/ML Pen Inject 40 Units into the skin daily with breakfast. And pen needles 1/day  15 pen  3  . omeprazole (PRILOSEC) 40 MG capsule Take 1 capsule (40 mg total) by mouth daily.  90 capsule  3  . pravastatin (PRAVACHOL) 20 MG tablet Take 1 tablet (20 mg total) by mouth daily.  90 tablet  0  . solifenacin (VESICARE) 5 MG tablet Take 1/2 tablet once daily  45 tablet  0  . triamcinolone cream (KENALOG) 0.1 % Apply topically 2 (two) times daily.  30 g  1  . warfarin (COUMADIN) 5 MG tablet Take as directed by anticoagulation clinic  10 tablet  0    Home: Home Living Family/patient expects to be discharged to:: Inpatient rehab  Functional History: Prior Function Level of Independence: Independent Functional Status:  Mobility: Bed Mobility Overal bed mobility: Needs Assistance Bed Mobility: Supine to Sit;Sit to Supine Supine to sit: Min assist;HOB elevated Sit to supine: Min assist General bed mobility comments: pt moves slowly and needs A to bring trunk up to sitting and LEs back to  bed.   Transfers Overall transfer level: Needs assistance Equipment used: 1 person hand held assist Transfers: Sit to/from Stand Sit to Stand: Min assist General transfer comment: cues for UE use.        ADL:    Cognition: Cognition Overall Cognitive Status: Impaired/Different from baseline Orientation Level: Oriented X4 Cognition Arousal/Alertness: Awake/alert Behavior During Therapy: WFL for tasks assessed/performed Overall Cognitive Status: Impaired/Different from baseline Area of Impairment: Attention;Memory;Awareness;Problem solving Current Attention Level: Selective Memory: Decreased short-term memory Awareness: Emergent Problem Solving: Slow processing;Difficulty sequencing;Requires verbal cues;Requires tactile cues  Blood pressure 103/55, pulse 103, temperature 99.6 F (37.6 C), temperature source Core (Comment), resp. rate 23, height 5\' 8"  (1.727 m), weight 173 lb 8 oz (78.7 kg), SpO2 100.00%. Physical Exam  Vitals reviewed. HENT:  Head: Normocephalic.  Eyes: EOM are normal.  Neck: Normal range of motion. Neck supple. No thyromegaly present.  Cardiovascular: Normal rate and regular rhythm.   Respiratory: Effort normal and breath sounds normal. No respiratory distress.  GI: Soft. Bowel sounds are normal. She exhibits no distension.  Neurological: She is alert.  Mood is flat but appropriate. She does make good eye contact with examiner. She was able to provide her name age date of birth  Skin: Skin is warm and dry.   motor strength is 5/5 in the right deltoid, bicep, tricep, grip, 4/ hip flexor, knee extensors, ankle dorsiflexor and plantar flexor 3 minus/5 in the left deltoid, bicep, tricep, grip, hip flexor, knee extensors, ankle dorsiflexor is 2 minus ankle plantar flexion 2 minus Tone hyperactive Babinski increased flexor tone in the left wrist and finger flexors as well as elbow flexors increased tone in the left plantar flexors Sensation reduced to light touch  in the left hand normal in the right hand normal in both feet Cerebellar there is past pointing in the left upper extremity Reduced heel to shin left lower extremity Right homonymous hemianopsia on confrontational testing  Results for orders placed during the hospital encounter of 03/08/14 (from the past 24 hour(s))  GLUCOSE, CAPILLARY     Status: Abnormal   Collection Time    03/08/14  3:26 PM      Result Value Ref Range   Glucose-Capillary 366 (*) 70 - 99 mg/dL  BASIC METABOLIC PANEL     Status: Abnormal   Collection Time    03/08/14  3:27 PM      Result Value Ref Range   Sodium 144  137 - 147 mEq/L   Potassium 3.5 (*) 3.7 - 5.3 mEq/L   Chloride 112  96 - 112 mEq/L   CO2 11 (*) 19 - 32 mEq/L   Glucose, Bld 393 (*) 70 - 99 mg/dL   BUN 31 (*) 6 - 23 mg/dL   Creatinine, Ser 03/10/14 (*) 0.50 - 1.10 mg/dL   Calcium 8.0 (*) 8.4 - 10.5 mg/dL   GFR calc non Af Amer 41 (*) >90 mL/min   GFR calc Af Amer 47 (*) >  90 mL/min  CARBOXYHEMOGLOBIN     Status: Abnormal   Collection Time    03/08/14  3:30 PM      Result Value Ref Range   Total hemoglobin 10.4 (*) 12.0 - 16.0 g/dL   O2 Saturation 96.0     Carboxyhemoglobin 1.6 (*) 0.5 - 1.5 %   Methemoglobin 1.0  0.0 - 1.5 %  GLUCOSE, CAPILLARY     Status: Abnormal   Collection Time    03/08/14  4:38 PM      Result Value Ref Range   Glucose-Capillary 311 (*) 70 - 99 mg/dL  TROPONIN I     Status: None   Collection Time    03/08/14  4:46 PM      Result Value Ref Range   Troponin I <0.30  <0.30 ng/mL  BASIC METABOLIC PANEL     Status: Abnormal   Collection Time    03/08/14  4:46 PM      Result Value Ref Range   Sodium 146  137 - 147 mEq/L   Potassium 3.4 (*) 3.7 - 5.3 mEq/L   Chloride 113 (*) 96 - 112 mEq/L   CO2 12 (*) 19 - 32 mEq/L   Glucose, Bld 304 (*) 70 - 99 mg/dL   BUN 30 (*) 6 - 23 mg/dL   Creatinine, Ser 4.54 (*) 0.50 - 1.10 mg/dL   Calcium 8.2 (*) 8.4 - 10.5 mg/dL   GFR calc non Af Amer 43 (*) >90 mL/min   GFR calc Af Amer 50  (*) >90 mL/min  MAGNESIUM     Status: None   Collection Time    03/08/14  4:46 PM      Result Value Ref Range   Magnesium 2.0  1.5 - 2.5 mg/dL  PHOSPHORUS     Status: Abnormal   Collection Time    03/08/14  4:46 PM      Result Value Ref Range   Phosphorus 1.0 (*) 2.3 - 4.6 mg/dL  GLUCOSE, CAPILLARY     Status: Abnormal   Collection Time    03/08/14  6:06 PM      Result Value Ref Range   Glucose-Capillary 193 (*) 70 - 99 mg/dL  POCT I-STAT 3, ART BLOOD GAS (G3+)     Status: Abnormal   Collection Time    03/08/14  6:51 PM      Result Value Ref Range   pH, Arterial 7.418  7.350 - 7.450   pCO2 arterial 22.6 (*) 35.0 - 45.0 mmHg   pO2, Arterial 244.0 (*) 80.0 - 100.0 mmHg   Bicarbonate 14.4 (*) 20.0 - 24.0 mEq/L   TCO2 15  0 - 100 mmol/L   O2 Saturation 100.0     Acid-base deficit 8.0 (*) 0.0 - 2.0 mmol/L   Patient temperature 100.2 F     Collection site RADIAL, ALLEN'S TEST ACCEPTABLE     Drawn by Operator     Sample type ARTERIAL    CBC     Status: Abnormal   Collection Time    03/08/14  7:00 PM      Result Value Ref Range   WBC 20.5 (*) 4.0 - 10.5 K/uL   RBC 3.67 (*) 3.87 - 5.11 MIL/uL   Hemoglobin 10.3 (*) 12.0 - 15.0 g/dL   HCT 09.8 (*) 11.9 - 14.7 %   MCV 82.8  78.0 - 100.0 fL   MCH 28.1  26.0 - 34.0 pg   MCHC 33.9  30.0 - 36.0 g/dL  RDW 13.1  11.5 - 15.5 %   Platelets 253  150 - 400 K/uL  CK     Status: None   Collection Time    03/08/14  7:00 PM      Result Value Ref Range   Total CK 155  7 - 177 U/L  LACTIC ACID, PLASMA     Status: Abnormal   Collection Time    03/08/14  7:00 PM      Result Value Ref Range   Lactic Acid, Venous 3.1 (*) 0.5 - 2.2 mmol/L  BASIC METABOLIC PANEL     Status: Abnormal   Collection Time    03/08/14  7:00 PM      Result Value Ref Range   Sodium 146  137 - 147 mEq/L   Potassium 3.2 (*) 3.7 - 5.3 mEq/L   Chloride 116 (*) 96 - 112 mEq/L   CO2 14 (*) 19 - 32 mEq/L   Glucose, Bld 158 (*) 70 - 99 mg/dL   BUN 27 (*) 6 - 23 mg/dL     Creatinine, Ser 1.60 (*) 0.50 - 1.10 mg/dL   Calcium 8.0 (*) 8.4 - 10.5 mg/dL   GFR calc non Af Amer 52 (*) >90 mL/min   GFR calc Af Amer 60 (*) >90 mL/min  GLUCOSE, CAPILLARY     Status: Abnormal   Collection Time    03/08/14  7:09 PM      Result Value Ref Range   Glucose-Capillary 165 (*) 70 - 99 mg/dL  OCCULT BLOOD GASTRIC / DUODENUM (SPECIMEN CUP)     Status: Abnormal   Collection Time    03/08/14  7:10 PM      Result Value Ref Range   pH, Gastric NOT DONE     Occult Blood, Gastric POSITIVE (*) NEGATIVE  GLUCOSE, CAPILLARY     Status: Abnormal   Collection Time    03/08/14  8:20 PM      Result Value Ref Range   Glucose-Capillary 118 (*) 70 - 99 mg/dL  GLUCOSE, CAPILLARY     Status: Abnormal   Collection Time    03/08/14  9:27 PM      Result Value Ref Range   Glucose-Capillary 118 (*) 70 - 99 mg/dL  TROPONIN I     Status: Abnormal   Collection Time    03/08/14 10:10 PM      Result Value Ref Range   Troponin I 0.32 (*) <0.30 ng/mL  BASIC METABOLIC PANEL     Status: Abnormal   Collection Time    03/08/14 10:10 PM      Result Value Ref Range   Sodium 143  137 - 147 mEq/L   Potassium 3.9  3.7 - 5.3 mEq/L   Chloride 114 (*) 96 - 112 mEq/L   CO2 14 (*) 19 - 32 mEq/L   Glucose, Bld 150 (*) 70 - 99 mg/dL   BUN 25 (*) 6 - 23 mg/dL   Creatinine, Ser 7.37 (*) 0.50 - 1.10 mg/dL   Calcium 8.0 (*) 8.4 - 10.5 mg/dL   GFR calc non Af Amer 56 (*) >90 mL/min   GFR calc Af Amer 65 (*) >90 mL/min  GLUCOSE, CAPILLARY     Status: Abnormal   Collection Time    03/08/14 10:13 PM      Result Value Ref Range   Glucose-Capillary 145 (*) 70 - 99 mg/dL  GLUCOSE, CAPILLARY     Status: Abnormal   Collection Time    03/08/14  11:09 PM      Result Value Ref Range   Glucose-Capillary 126 (*) 70 - 99 mg/dL  BASIC METABOLIC PANEL     Status: Abnormal   Collection Time    03/09/14 12:04 AM      Result Value Ref Range   Sodium 141  137 - 147 mEq/L   Potassium 4.4  3.7 - 5.3 mEq/L    Chloride 113 (*) 96 - 112 mEq/L   CO2 14 (*) 19 - 32 mEq/L   Glucose, Bld 179 (*) 70 - 99 mg/dL   BUN 24 (*) 6 - 23 mg/dL   Creatinine, Ser 3.32  0.50 - 1.10 mg/dL   Calcium 7.8 (*) 8.4 - 10.5 mg/dL   GFR calc non Af Amer 58 (*) >90 mL/min   GFR calc Af Amer 67 (*) >90 mL/min  CBC     Status: Abnormal   Collection Time    03/09/14 12:04 AM      Result Value Ref Range   WBC 17.2 (*) 4.0 - 10.5 K/uL   RBC 3.60 (*) 3.87 - 5.11 MIL/uL   Hemoglobin 10.4 (*) 12.0 - 15.0 g/dL   HCT 95.1 (*) 88.4 - 16.6 %   MCV 82.2  78.0 - 100.0 fL   MCH 28.9  26.0 - 34.0 pg   MCHC 35.1  30.0 - 36.0 g/dL   RDW 06.3  01.6 - 01.0 %   Platelets 212  150 - 400 K/uL  GLUCOSE, CAPILLARY     Status: Abnormal   Collection Time    03/09/14 12:16 AM      Result Value Ref Range   Glucose-Capillary 178 (*) 70 - 99 mg/dL  GLUCOSE, CAPILLARY     Status: Abnormal   Collection Time    03/09/14  1:10 AM      Result Value Ref Range   Glucose-Capillary 145 (*) 70 - 99 mg/dL  GLUCOSE, CAPILLARY     Status: Abnormal   Collection Time    03/09/14  2:04 AM      Result Value Ref Range   Glucose-Capillary 170 (*) 70 - 99 mg/dL  GLUCOSE, CAPILLARY     Status: Abnormal   Collection Time    03/09/14  3:07 AM      Result Value Ref Range   Glucose-Capillary 189 (*) 70 - 99 mg/dL  LACTIC ACID, PLASMA     Status: None   Collection Time    03/09/14  4:13 AM      Result Value Ref Range   Lactic Acid, Venous 2.0  0.5 - 2.2 mmol/L  PROTIME-INR     Status: Abnormal   Collection Time    03/09/14  4:15 AM      Result Value Ref Range   Prothrombin Time 21.2 (*) 11.6 - 15.2 seconds   INR 1.90 (*) 0.00 - 1.49  APTT     Status: None   Collection Time    03/09/14  4:15 AM      Result Value Ref Range   aPTT 37  24 - 37 seconds  CBC     Status: Abnormal   Collection Time    03/09/14  4:15 AM      Result Value Ref Range   WBC 15.9 (*) 4.0 - 10.5 K/uL   RBC 3.64 (*) 3.87 - 5.11 MIL/uL   Hemoglobin 10.3 (*) 12.0 - 15.0 g/dL    HCT 93.2 (*) 35.5 - 46.0 %   MCV 81.3  78.0 -  100.0 fL   MCH 28.3  26.0 - 34.0 pg   MCHC 34.8  30.0 - 36.0 g/dL   RDW 16.1  09.6 - 04.5 %   Platelets 203  150 - 400 K/uL  PROCALCITONIN     Status: None   Collection Time    03/09/14  4:15 AM      Result Value Ref Range   Procalcitonin 2.07    BASIC METABOLIC PANEL     Status: Abnormal   Collection Time    03/09/14  4:15 AM      Result Value Ref Range   Sodium 142  137 - 147 mEq/L   Potassium 3.2 (*) 3.7 - 5.3 mEq/L   Chloride 111  96 - 112 mEq/L   CO2 14 (*) 19 - 32 mEq/L   Glucose, Bld 180 (*) 70 - 99 mg/dL   BUN 21  6 - 23 mg/dL   Creatinine, Ser 4.09  0.50 - 1.10 mg/dL   Calcium 7.8 (*) 8.4 - 10.5 mg/dL   GFR calc non Af Amer 58 (*) >90 mL/min   GFR calc Af Amer 67 (*) >90 mL/min  MAGNESIUM     Status: None   Collection Time    03/09/14  4:15 AM      Result Value Ref Range   Magnesium 1.5  1.5 - 2.5 mg/dL  PHOSPHORUS     Status: Abnormal   Collection Time    03/09/14  4:15 AM      Result Value Ref Range   Phosphorus 1.5 (*) 2.3 - 4.6 mg/dL  TROPONIN I     Status: Abnormal   Collection Time    03/09/14  4:16 AM      Result Value Ref Range   Troponin I 0.43 (*) <0.30 ng/mL  GLUCOSE, CAPILLARY     Status: Abnormal   Collection Time    03/09/14  4:18 AM      Result Value Ref Range   Glucose-Capillary 176 (*) 70 - 99 mg/dL  GLUCOSE, CAPILLARY     Status: Abnormal   Collection Time    03/09/14  5:19 AM      Result Value Ref Range   Glucose-Capillary 134 (*) 70 - 99 mg/dL  GLUCOSE, CAPILLARY     Status: Abnormal   Collection Time    03/09/14  6:10 AM      Result Value Ref Range   Glucose-Capillary 105 (*) 70 - 99 mg/dL  GLUCOSE, CAPILLARY     Status: Abnormal   Collection Time    03/09/14  7:35 AM      Result Value Ref Range   Glucose-Capillary 167 (*) 70 - 99 mg/dL  BASIC METABOLIC PANEL     Status: Abnormal   Collection Time    03/09/14  8:00 AM      Result Value Ref Range   Sodium 139  137 - 147 mEq/L    Potassium 3.7  3.7 - 5.3 mEq/L   Chloride 109  96 - 112 mEq/L   CO2 12 (*) 19 - 32 mEq/L   Glucose, Bld 181 (*) 70 - 99 mg/dL   BUN 18  6 - 23 mg/dL   Creatinine, Ser 8.11  0.50 - 1.10 mg/dL   Calcium 7.9 (*) 8.4 - 10.5 mg/dL   GFR calc non Af Amer 66 (*) >90 mL/min   GFR calc Af Amer 77 (*) >90 mL/min  GLUCOSE, CAPILLARY     Status: Abnormal   Collection Time  03/09/14  8:35 AM      Result Value Ref Range   Glucose-Capillary 199 (*) 70 - 99 mg/dL  GLUCOSE, CAPILLARY     Status: Abnormal   Collection Time    03/09/14  9:43 AM      Result Value Ref Range   Glucose-Capillary 176 (*) 70 - 99 mg/dL  TROPONIN I     Status: Abnormal   Collection Time    03/09/14  9:57 AM      Result Value Ref Range   Troponin I 0.52 (*) <0.30 ng/mL  GLUCOSE, CAPILLARY     Status: Abnormal   Collection Time    03/09/14 10:50 AM      Result Value Ref Range   Glucose-Capillary 151 (*) 70 - 99 mg/dL  GLUCOSE, CAPILLARY     Status: Abnormal   Collection Time    03/09/14 11:54 AM      Result Value Ref Range   Glucose-Capillary 144 (*) 70 - 99 mg/dL  BASIC METABOLIC PANEL     Status: Abnormal   Collection Time    03/09/14 12:00 PM      Result Value Ref Range   Sodium 140  137 - 147 mEq/L   Potassium 4.0  3.7 - 5.3 mEq/L   Chloride 110  96 - 112 mEq/L   CO2 17 (*) 19 - 32 mEq/L   Glucose, Bld 163 (*) 70 - 99 mg/dL   BUN 14  6 - 23 mg/dL   Creatinine, Ser 4.13  0.50 - 1.10 mg/dL   Calcium 7.7 (*) 8.4 - 10.5 mg/dL   GFR calc non Af Amer 67 (*) >90 mL/min   GFR calc Af Amer 78 (*) >90 mL/min  HCG, QUANTITATIVE, PREGNANCY     Status: None   Collection Time    03/09/14 12:00 PM      Result Value Ref Range   hCG, Beta Chain, Quant, S 3  <5 mIU/mL  GLUCOSE, CAPILLARY     Status: Abnormal   Collection Time    03/09/14 12:59 PM      Result Value Ref Range   Glucose-Capillary 131 (*) 70 - 99 mg/dL   Ct Head Wo Contrast  03/08/2014   CLINICAL DATA:  Altered mental status  EXAM: CT HEAD WITHOUT  CONTRAST  TECHNIQUE: Contiguous axial images were obtained from the base of the skull through the vertex without intravenous contrast.  COMPARISON:  None.  FINDINGS: Extensive chronic ischemic changes are present. There is severe encephalomalacia in the right parietal lobe. Extensive chronic ischemic changes in the periventricular white matter. Encephalomalacia from focal infarcts in the left occipital and parietal lobes is also present. There is low-density in the gray matter and adjacent white matter of the right frontal lobe with effacement of the sulci and intermediate Hounsfield unit measurements. This has a subacute appearance. No evidence of acute intracranial hemorrhage. No midline shift. Mild global atrophy.  IMPRESSION: Extensive chronic ischemic changes as described. 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   Electronically Signed   By: Maryclare Bean M.D.   On: 03/08/2014 18:04   Dg Chest Port 1 View  03/08/2014   CLINICAL DATA:  Central line placement.  EXAM: PORTABLE CHEST - 1 VIEW  COMPARISON:  03/08/2014  FINDINGS: Prior median sternotomy and valve replacement. Endotracheal tube remains approximately 2 cm above the carina. Right central line is in place with the tip at the cavoatrial junction. No pneumothorax.  Hyperinflation of  the lungs. No confluent opacities. No effusions. No acute bony abnormality.  IMPRESSION: Right central line tip at the cavoatrial junction.  No pneumothorax.  Mild hyperinflation.  No acute cardiopulmonary disease.   Electronically Signed   By: Charlett NoseKevin  Dover M.D.   On: 03/08/2014 13:49   Dg Chest Portable 1 View  03/08/2014   CLINICAL DATA:  Intubation, central line placement  EXAM: PORTABLE CHEST - 1 VIEW  COMPARISON:  Portable exam 1116 hr compared to 03/08/2014 at 0849 hr  FINDINGS: Tip of endotracheal tube projects 2.2 cm above carinal.  No central venous catheter identified.  Normal heart size post AVR.  Mediastinal contours and pulmonary  vascularity normal.  Lungs clear.  No pleural effusion, pneumothorax or acute osseous findings.  IMPRESSION: No acute abnormalities.   Electronically Signed   By: Ulyses SouthwardMark  Boles M.D.   On: 03/08/2014 11:41   Dg Chest Portable 1 View  03/08/2014   CLINICAL DATA:  Altered mental status.  Hyperglycemia.  EXAM: PORTABLE CHEST - 1 VIEW  COMPARISON:  None.  FINDINGS: There has been previous median sternotomy and aortic valve replacement. Heart size is normal. The lungs are clear. The vascularity is normal. No effusions.  IMPRESSION: Previous AVR.  No active disease.   Electronically Signed   By: Paulina FusiMark  Shogry M.D.   On: 03/08/2014 09:04   Dg Abd Portable 1v  03/08/2014   CLINICAL DATA:  NG tube placement.  EXAM: PORTABLE ABDOMEN - 1 VIEW  COMPARISON:  None.  FINDINGS: NG tube coils in the fundus. The catheter then passes distally with the tip in the antrum of the stomach.  Calcified fibroid in the pelvis. Nonobstructive bowel gas pattern. No free air organomegaly.  IMPRESSION: NG tube coils in the fundus with the tip in the antrum.   Electronically Signed   By: Charlett NoseKevin  Dover M.D.   On: 03/08/2014 13:53    Assessment/Plan: Diagnosis: New right frontal infarct superimposed on previous right MCA infarct 1. Does the need for close, 24 hr/day medical supervision in concert with the patient's rehab needs make it unreasonable for this patient to be served in a less intensive setting? Yes 2. Co-Morbidities requiring supervision/potential complications: Diabetic ketoacidosis, type 1 diabetes, sepsis 3. Due to bladder management, bowel management, safety, skin/wound care, disease management, medication administration and pain management, does the patient require 24 hr/day rehab nursing? Yes 4. Does the patient require coordinated care of a physician, rehab nurse, PT ( 1-2 hrs/day, 5 days/week) and OT (1-2 hrs/day, 5 days/week) to address physical and functional deficits in the context of the above medical diagnosis(es)?  Yes Addressing deficits in the following areas: balance, endurance, locomotion, strength, transferring, bowel/bladder control, bathing, dressing and toileting 5. Can the patient actively participate in an intensive therapy program of at least 3 hrs of therapy per day at least 5 days per week? Yes 6. The potential for patient to make measurable gains while on inpatient rehab is good 7. Anticipated functional outcomes upon discharge from inpatient rehab are modified independent  with PT, modified independent with OT, n/a with SLP. 8. Estimated rehab length of stay to reach the above functional goals is: 10-14 days 9. Does the patient have adequate social supports to accommodate these discharge functional goals? Potentially 10. Anticipated D/C setting: Home 11. Anticipated post D/C treatments: HH therapy 12. Overall Rehab/Functional Prognosis: good  RECOMMENDATIONS: This patient's condition is appropriate for continued rehabilitative care in the following setting: CIR Patient has agreed to participate in recommended program. Yes  Note that insurance prior authorization may be required for reimbursement for recommended care.  Comment:     03/09/2014

## 2014-03-09 NOTE — Care Management Note (Signed)
    Page 1 of 1   03/12/2014     3:49:00 PM CARE MANAGEMENT NOTE 03/12/2014  Patient:  Madison [name], Madison [name]   Account Number:  [id number]  Date Initiated:  03/08/2014  Documentation initiated by:  Avie Arenas  Subjective/Objective Assessment:   Found down unresponsive.  DKA - intubated - insulin drip.     Action/Plan:   Anticipated DC Date:  03/12/2014   Anticipated DC Plan:  IP REHAB FACILITY      DC Planning Services  CM consult      Choice offered to / List presented to:             Status of service:  Completed, signed off Medicare Important Message given?   (If response is "NO", the following Medicare IM given date fields will be blank) Date Medicare IM given:   Date Additional Medicare IM given:    Discharge Disposition:  IP REHAB FACILITY  Per UR Regulation:  Reviewed for med. necessity/level of care/duration of stay  If discussed at Long Length of Stay Meetings, dates discussed:    Comments:  Contact:  Summers,Tracy Sister [phone]  03/12/14 Nickie Retort, BSN [phone] Pt dc'd to [facility] IP Rehab today.  03/11/14 Nickie Retort, BSN [phone] IP rehab following for possible admission when medically stable, pending insurance authorization.  Will follow progress.  03-09-14 12:02 Avie Arenas, RNBSN [phone] Was able to contact Mosetta Anis -  phone number corrected now in system and epic.  Sister has patients 32 year old son, Apolinar Junes.  Patient has a 7 year old son who is in [place] at present - she does not have a number for him.  Patient also has a mother, and 2 sisters in [place] and one sister in [place].  Sister, [name] states she is the spokes person for the rest of the family.  Confirmed with sister that she will be at this number for nurse/physician to call.

## 2014-03-09 NOTE — ED Provider Notes (Signed)
I saw and evaluated the patient, reviewed the resident's note and I agree with the findings and plan.  Pt with hx iddm, hx noncompliance w medications/medical recommendations, w markedly altered mental status, tachypnea, and blood sugars that read high per ems.  Reportedly yesterday pt acting normally per family.  Pt limited historian given altered mental status - level 5 caveat.  Pt tachycardic, tachypneic, afeb. No neck stiffness or rigidity. Chest cta. abd soft nt.   Continuous pulse ox and monitor. Labs. Iv x 2. Iv ns boluses.   Pt w marked metabolic acidosis/dka.   Additional iv ns bolus. Iv insulin via glucose stabilizer.   w fluids, insulin drip pt appears mildly improved, more alert/responsive.  Critical care team called to admit.  Results for orders placed during the hospital encounter of 03/08/14  CULTURE, BLOOD (ROUTINE X 2)      Result Value Ref Range   Specimen Description BLOOD RIGHT ANTECUBITAL     Special Requests BOTTLES DRAWN AEROBIC AND ANAEROBIC 5CCS     Culture  Setup Time       Value: 03/08/2014 13:30     Performed at [facility]   Culture       Value:        BLOOD CULTURE RECEIVED NO GROWTH TO DATE CULTURE WILL BE HELD FOR 5 DAYS BEFORE ISSUING A FINAL NEGATIVE REPORT     Performed at [facility]   Report Status PENDING    CULTURE, BLOOD (ROUTINE X 2)      Result Value Ref Range   Specimen Description BLOOD LEFT ANTECUBITAL     Special Requests BOTTLES DRAWN AEROBIC AND ANAEROBIC 10CCS     Culture  Setup Time       Value: 03/08/2014 13:31     Performed at [facility]   Culture       Value:        BLOOD CULTURE RECEIVED NO GROWTH TO DATE CULTURE WILL BE HELD FOR 5 DAYS BEFORE ISSUING A FINAL NEGATIVE REPORT     Performed at [facility]   Report Status PENDING    MRSA PCR SCREENING      Result Value Ref Range   MRSA by PCR NEGATIVE  NEGATIVE  CBC      Result Value Ref Range   WBC 27.0 (*) 4.0 - 10.5 K/uL   RBC 4.18   3.87 - 5.11 MIL/uL   Hemoglobin 12.1  12.0 - 15.0 g/dL   HCT 59.9  77.4 - 14.2 %   MCV 92.8  78.0 - 100.0 fL   MCH 28.9  26.0 - 34.0 pg   MCHC 31.2  30.0 - 36.0 g/dL   RDW 39.5  32.0 - 23.3 %   Platelets 351  150 - 400 K/uL  COMPREHENSIVE METABOLIC PANEL      Result Value Ref Range   Sodium 132 (*) 137 - 147 mEq/L   Potassium 7.3 (*) 3.7 - 5.3 mEq/L   Chloride 86 (*) 96 - 112 mEq/L   CO2 <7 (*) 19 - 32 mEq/L   Glucose, Bld 1124 (*) 70 - 99 mg/dL   BUN 36 (*) 6 - 23 mg/dL   Creatinine, Ser 4.35 (*) 0.50 - 1.10 mg/dL   Calcium 9.4  8.4 - 68.6 mg/dL   Total Protein 7.5  6.0 - 8.3 g/dL   Albumin 3.7  3.5 - 5.2 g/dL   AST 16  0 - 37 U/L   ALT 15  0 -  35 U/L   Alkaline Phosphatase 133 (*) 39 - 117 U/L   Total Bilirubin <0.2 (*) 0.3 - 1.2 mg/dL   GFR calc non Af Amer 31 (*) >90 mL/min   GFR calc Af Amer 35 (*) >90 mL/min  URINALYSIS, ROUTINE W REFLEX MICROSCOPIC      Result Value Ref Range   Color, Urine YELLOW  YELLOW   APPearance CLEAR  CLEAR   Specific Gravity, Urine 1.027  1.005 - 1.030   pH 5.0  5.0 - 8.0   Glucose, UA >1000 (*) NEGATIVE mg/dL   Hgb urine dipstick NEGATIVE  NEGATIVE   Bilirubin Urine NEGATIVE  NEGATIVE   Ketones, ur 40 (*) NEGATIVE mg/dL   Protein, ur NEGATIVE  NEGATIVE mg/dL   Urobilinogen, UA 0.2  0.0 - 1.0 mg/dL   Nitrite NEGATIVE  NEGATIVE   Leukocytes, UA NEGATIVE  NEGATIVE  PROTIME-INR      Result Value Ref Range   Prothrombin Time 16.5 (*) 11.6 - 15.2 seconds   INR 1.37  0.00 - 1.49  KETONES, QUALITATIVE      Result Value Ref Range   Acetone, Bld SMALL (*) NEGATIVE  URINE MICROSCOPIC-ADD ON      Result Value Ref Range   Squamous Epithelial / LPF RARE  RARE   WBC, UA 0-2  <3 WBC/hpf   Bacteria, UA RARE  RARE  LACTIC ACID, PLASMA      Result Value Ref Range   Lactic Acid, Venous 3.4 (*) 0.5 - 2.2 mmol/L  TROPONIN I      Result Value Ref Range   Troponin I <0.30  <0.30 ng/mL  TROPONIN I      Result Value Ref Range   Troponin I <0.30   <0.30 ng/mL  TROPONIN I      Result Value Ref Range   Troponin I 0.32 (*) <0.30 ng/mL  PROCALCITONIN      Result Value Ref Range   Procalcitonin 2.06    CORTISOL-AM, BLOOD      Result Value Ref Range   Cortisol - AM 25.2 (*) 4.3 - 22.4 ug/dL  BASIC METABOLIC PANEL      Result Value Ref Range   Sodium 142  137 - 147 mEq/L   Potassium 4.0  3.7 - 5.3 mEq/L   Chloride 109  96 - 112 mEq/L   CO2 8 (*) 19 - 32 mEq/L   Glucose, Bld 541 (*) 70 - 99 mg/dL   BUN 32 (*) 6 - 23 mg/dL   Creatinine, Ser 1.09 (*) 0.50 - 1.10 mg/dL   Calcium 7.6 (*) 8.4 - 10.5 mg/dL   GFR calc non Af Amer 37 (*) >90 mL/min   GFR calc Af Amer 43 (*) >90 mL/min  BASIC METABOLIC PANEL      Result Value Ref Range   Sodium 144  137 - 147 mEq/L   Potassium 3.5 (*) 3.7 - 5.3 mEq/L   Chloride 112  96 - 112 mEq/L   CO2 11 (*) 19 - 32 mEq/L   Glucose, Bld 393 (*) 70 - 99 mg/dL   BUN 31 (*) 6 - 23 mg/dL   Creatinine, Ser 3.23 (*) 0.50 - 1.10 mg/dL   Calcium 8.0 (*) 8.4 - 10.5 mg/dL   GFR calc non Af Amer 41 (*) >90 mL/min   GFR calc Af Amer 47 (*) >90 mL/min  BASIC METABOLIC PANEL      Result Value Ref Range   Sodium 146  137 - 147 mEq/L  Potassium 3.4 (*) 3.7 - 5.3 mEq/L   Chloride 113 (*) 96 - 112 mEq/L   CO2 12 (*) 19 - 32 mEq/L   Glucose, Bld 304 (*) 70 - 99 mg/dL   BUN 30 (*) 6 - 23 mg/dL   Creatinine, Ser 1.69 (*) 0.50 - 1.10 mg/dL   Calcium 8.2 (*) 8.4 - 10.5 mg/dL   GFR calc non Af Amer 43 (*) >90 mL/min   GFR calc Af Amer 50 (*) >90 mL/min  URINALYSIS, ROUTINE W REFLEX MICROSCOPIC      Result Value Ref Range   Color, Urine YELLOW  YELLOW   APPearance CLOUDY (*) CLEAR   Specific Gravity, Urine 1.025  1.005 - 1.030   pH 5.0  5.0 - 8.0   Glucose, UA >1000 (*) NEGATIVE mg/dL   Hgb urine dipstick MODERATE (*) NEGATIVE   Bilirubin Urine MODERATE (*) NEGATIVE   Ketones, ur 40 (*) NEGATIVE mg/dL   Protein, ur 30 (*) NEGATIVE mg/dL   Urobilinogen, UA 1.0  0.0 - 1.0 mg/dL   Nitrite NEGATIVE  NEGATIVE    Leukocytes, UA NEGATIVE  NEGATIVE  URINE MICROSCOPIC-ADD ON      Result Value Ref Range   Squamous Epithelial / LPF RARE  RARE   WBC, UA 0-2  <3 WBC/hpf   RBC / HPF 3-6  <3 RBC/hpf   Bacteria, UA FEW (*) RARE   Casts GRANULAR CAST (*) NEGATIVE  CARBOXYHEMOGLOBIN      Result Value Ref Range   Total hemoglobin 10.4 (*) 12.0 - 16.0 g/dL   O2 Saturation 67.8     Carboxyhemoglobin 1.6 (*) 0.5 - 1.5 %   Methemoglobin 1.0  0.0 - 1.5 %  PROTIME-INR      Result Value Ref Range   Prothrombin Time 21.2 (*) 11.6 - 15.2 seconds   INR 1.90 (*) 0.00 - 1.49  APTT      Result Value Ref Range   aPTT 37  24 - 37 seconds  CBC      Result Value Ref Range   WBC 15.9 (*) 4.0 - 10.5 K/uL   RBC 3.64 (*) 3.87 - 5.11 MIL/uL   Hemoglobin 10.3 (*) 12.0 - 15.0 g/dL   HCT 93.8 (*) 10.1 - 75.1 %   MCV 81.3  78.0 - 100.0 fL   MCH 28.3  26.0 - 34.0 pg   MCHC 34.8  30.0 - 36.0 g/dL   RDW 02.5  85.2 - 77.8 %   Platelets 203  150 - 400 K/uL  MAGNESIUM      Result Value Ref Range   Magnesium 2.0  1.5 - 2.5 mg/dL  PHOSPHORUS      Result Value Ref Range   Phosphorus 1.0 (*) 2.3 - 4.6 mg/dL  CBC      Result Value Ref Range   WBC 20.5 (*) 4.0 - 10.5 K/uL   RBC 3.67 (*) 3.87 - 5.11 MIL/uL   Hemoglobin 10.3 (*) 12.0 - 15.0 g/dL   HCT 24.2 (*) 35.3 - 61.4 %   MCV 82.8  78.0 - 100.0 fL   MCH 28.1  26.0 - 34.0 pg   MCHC 33.9  30.0 - 36.0 g/dL   RDW 43.1  54.0 - 08.6 %   Platelets 253  150 - 400 K/uL  CK      Result Value Ref Range   Total CK 155  7 - 177 U/L  LACTIC ACID, PLASMA      Result Value Ref Range  Lactic Acid, Venous 3.1 (*) 0.5 - 2.2 mmol/L  OCCULT BLOOD GASTRIC / DUODENUM (SPECIMEN CUP)      Result Value Ref Range   pH, Gastric NOT DONE     Occult Blood, Gastric POSITIVE (*) NEGATIVE  GLUCOSE, CAPILLARY      Result Value Ref Range   Glucose-Capillary 531 (*) 70 - 99 mg/dL   Comment 1 Documented in Chart    GLUCOSE, CAPILLARY      Result Value Ref Range   Glucose-Capillary 500 (*) 70 -  99 mg/dL  GLUCOSE, CAPILLARY      Result Value Ref Range   Glucose-Capillary 366 (*) 70 - 99 mg/dL  GLUCOSE, CAPILLARY      Result Value Ref Range   Glucose-Capillary 311 (*) 70 - 99 mg/dL  GLUCOSE, CAPILLARY      Result Value Ref Range   Glucose-Capillary 193 (*) 70 - 99 mg/dL  GLUCOSE, CAPILLARY      Result Value Ref Range   Glucose-Capillary 165 (*) 70 - 99 mg/dL  BASIC METABOLIC PANEL      Result Value Ref Range   Sodium 146  137 - 147 mEq/L   Potassium 3.2 (*) 3.7 - 5.3 mEq/L   Chloride 116 (*) 96 - 112 mEq/L   CO2 14 (*) 19 - 32 mEq/L   Glucose, Bld 158 (*) 70 - 99 mg/dL   BUN 27 (*) 6 - 23 mg/dL   Creatinine, Ser 1.61 (*) 0.50 - 1.10 mg/dL   Calcium 8.0 (*) 8.4 - 10.5 mg/dL   GFR calc non Af Amer 52 (*) >90 mL/min   GFR calc Af Amer 60 (*) >90 mL/min  BASIC METABOLIC PANEL      Result Value Ref Range   Sodium 143  137 - 147 mEq/L   Potassium 3.9  3.7 - 5.3 mEq/L   Chloride 114 (*) 96 - 112 mEq/L   CO2 14 (*) 19 - 32 mEq/L   Glucose, Bld 150 (*) 70 - 99 mg/dL   BUN 25 (*) 6 - 23 mg/dL   Creatinine, Ser 0.96 (*) 0.50 - 1.10 mg/dL   Calcium 8.0 (*) 8.4 - 10.5 mg/dL   GFR calc non Af Amer 56 (*) >90 mL/min   GFR calc Af Amer 65 (*) >90 mL/min  BASIC METABOLIC PANEL      Result Value Ref Range   Sodium 141  137 - 147 mEq/L   Potassium 4.4  3.7 - 5.3 mEq/L   Chloride 113 (*) 96 - 112 mEq/L   CO2 14 (*) 19 - 32 mEq/L   Glucose, Bld 179 (*) 70 - 99 mg/dL   BUN 24 (*) 6 - 23 mg/dL   Creatinine, Ser 0.45  0.50 - 1.10 mg/dL   Calcium 7.8 (*) 8.4 - 10.5 mg/dL   GFR calc non Af Amer 58 (*) >90 mL/min   GFR calc Af Amer 67 (*) >90 mL/min  LACTIC ACID, PLASMA      Result Value Ref Range   Lactic Acid, Venous 2.0  0.5 - 2.2 mmol/L  PROCALCITONIN      Result Value Ref Range   Procalcitonin 2.07    GLUCOSE, CAPILLARY      Result Value Ref Range   Glucose-Capillary 118 (*) 70 - 99 mg/dL  GLUCOSE, CAPILLARY      Result Value Ref Range   Glucose-Capillary 118 (*) 70 - 99  mg/dL  GLUCOSE, CAPILLARY      Result Value Ref Range  Glucose-Capillary 145 (*) 70 - 99 mg/dL  TROPONIN I      Result Value Ref Range   Troponin I 0.43 (*) <0.30 ng/mL  CBC      Result Value Ref Range   WBC 17.2 (*) 4.0 - 10.5 K/uL   RBC 3.60 (*) 3.87 - 5.11 MIL/uL   Hemoglobin 10.4 (*) 12.0 - 15.0 g/dL   HCT 86.7 (*) 67.2 - 09.4 %   MCV 82.2  78.0 - 100.0 fL   MCH 28.9  26.0 - 34.0 pg   MCHC 35.1  30.0 - 36.0 g/dL   RDW 70.9  62.8 - 36.6 %   Platelets 212  150 - 400 K/uL  GLUCOSE, CAPILLARY      Result Value Ref Range   Glucose-Capillary 126 (*) 70 - 99 mg/dL  GLUCOSE, CAPILLARY      Result Value Ref Range   Glucose-Capillary 178 (*) 70 - 99 mg/dL  GLUCOSE, CAPILLARY      Result Value Ref Range   Glucose-Capillary 145 (*) 70 - 99 mg/dL  BASIC METABOLIC PANEL      Result Value Ref Range   Sodium 142  137 - 147 mEq/L   Potassium 3.2 (*) 3.7 - 5.3 mEq/L   Chloride 111  96 - 112 mEq/L   CO2 14 (*) 19 - 32 mEq/L   Glucose, Bld 180 (*) 70 - 99 mg/dL   BUN 21  6 - 23 mg/dL   Creatinine, Ser 2.94  0.50 - 1.10 mg/dL   Calcium 7.8 (*) 8.4 - 10.5 mg/dL   GFR calc non Af Amer 58 (*) >90 mL/min   GFR calc Af Amer 67 (*) >90 mL/min  GLUCOSE, CAPILLARY      Result Value Ref Range   Glucose-Capillary 170 (*) 70 - 99 mg/dL  GLUCOSE, CAPILLARY      Result Value Ref Range   Glucose-Capillary 189 (*) 70 - 99 mg/dL  GLUCOSE, CAPILLARY      Result Value Ref Range   Glucose-Capillary 176 (*) 70 - 99 mg/dL  MAGNESIUM      Result Value Ref Range   Magnesium 1.5  1.5 - 2.5 mg/dL  PHOSPHORUS      Result Value Ref Range   Phosphorus 1.5 (*) 2.3 - 4.6 mg/dL  CBG MONITORING, ED      Result Value Ref Range   Glucose-Capillary >600 (*) 70 - 99 mg/dL  I-STAT CG4 LACTIC ACID, ED      Result Value Ref Range   Lactic Acid, Venous 9.81 (*) 0.5 - 2.2 mmol/L  I-STAT VENOUS BLOOD GAS, ED      Result Value Ref Range   pH, Ven 6.946 (*) 7.250 - 7.300   pCO2, Ven 22.0 (*) 45.0 - 50.0 mmHg    pO2, Ven 51.0 (*) 30.0 - 45.0 mmHg   Bicarbonate 4.8 (*) 20.0 - 24.0 mEq/L   TCO2 5  0 - 100 mmol/L   O2 Saturation 63.0     Acid-base deficit 26.0 (*) 0.0 - 2.0 mmol/L   Sample type VENOUS     Comment NOTIFIED PHYSICIAN    CBG MONITORING, ED      Result Value Ref Range   Glucose-Capillary >600 (*) 70 - 99 mg/dL  CBG MONITORING, ED      Result Value Ref Range   Glucose-Capillary >600 (*) 70 - 99 mg/dL  CBG MONITORING, ED      Result Value Ref Range   Glucose-Capillary >600 (*) 70 - 99  mg/dL  I-STAT ARTERIAL BLOOD GAS, ED      Result Value Ref Range   pH, Arterial 7.062 (*) 7.350 - 7.450   pCO2 arterial 15.2 (*) 35.0 - 45.0 mmHg   pO2, Arterial 259.0 (*) 80.0 - 100.0 mmHg   Bicarbonate 4.3 (*) 20.0 - 24.0 mEq/L   TCO2 <5  0 - 100 mmol/L   O2 Saturation 100.0     Acid-base deficit 24.0 (*) 0.0 - 2.0 mmol/L   Patient temperature 98.9 F     Collection site BRACHIAL ARTERY     Drawn by Operator     Sample type ARTERIAL     Comment NOTIFIED PHYSICIAN    POCT I-STAT 3, ART BLOOD GAS (G3+)      Result Value Ref Range   pH, Arterial 7.418  7.350 - 7.450   pCO2 arterial 22.6 (*) 35.0 - 45.0 mmHg   pO2, Arterial 244.0 (*) 80.0 - 100.0 mmHg   Bicarbonate 14.4 (*) 20.0 - 24.0 mEq/L   TCO2 15  0 - 100 mmol/L   O2 Saturation 100.0     Acid-base deficit 8.0 (*) 0.0 - 2.0 mmol/L   Patient temperature 100.2 F     Collection site RADIAL, ALLEN'S TEST ACCEPTABLE     Drawn by Operator     Sample type ARTERIAL     Ct Head Wo Contrast  03/08/2014   CLINICAL DATA:  Altered mental status  EXAM: CT HEAD WITHOUT CONTRAST  TECHNIQUE: Contiguous axial images were obtained from the base of the skull through the vertex without intravenous contrast.  COMPARISON:  None.  FINDINGS: Extensive chronic ischemic changes are present. There is severe encephalomalacia in the right parietal lobe. Extensive chronic ischemic changes in the periventricular white matter. Encephalomalacia from focal infarcts in the  left occipital and parietal lobes is also present. There is low-density in the gray matter and adjacent white matter of the right frontal lobe with effacement of the sulci and intermediate Hounsfield unit measurements. This has a subacute appearance. No evidence of acute intracranial hemorrhage. No midline shift. Mild global atrophy.  IMPRESSION: Extensive chronic ischemic changes as described. 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   Electronically Signed   By: Maryclare Bean M.D.   On: 03/08/2014 18:04   Dg Chest Port 1 View  03/08/2014   CLINICAL DATA:  Central line placement.  EXAM: PORTABLE CHEST - 1 VIEW  COMPARISON:  03/08/2014  FINDINGS: Prior median sternotomy and valve replacement. Endotracheal tube remains approximately 2 cm above the carina. Right central line is in place with the tip at the cavoatrial junction. No pneumothorax.  Hyperinflation of the lungs. No confluent opacities. No effusions. No acute bony abnormality.  IMPRESSION: Right central line tip at the cavoatrial junction.  No pneumothorax.  Mild hyperinflation.  No acute cardiopulmonary disease.   Electronically Signed   By: Charlett Nose M.D.   On: 03/08/2014 13:49   Dg Chest Portable 1 View  03/08/2014   CLINICAL DATA:  Intubation, central line placement  EXAM: PORTABLE CHEST - 1 VIEW  COMPARISON:  Portable exam 1116 hr compared to 03/08/2014 at 0849 hr  FINDINGS: Tip of endotracheal tube projects 2.2 cm above carinal.  No central venous catheter identified.  Normal heart size post AVR.  Mediastinal contours and pulmonary vascularity normal.  Lungs clear.  No pleural effusion, pneumothorax or acute osseous findings.  IMPRESSION: No acute abnormalities.   Electronically Signed   By: Loraine Leriche  Tyron RussellBoles M.D.   On: 03/08/2014 11:41   Dg Chest Portable 1 View  03/08/2014   CLINICAL DATA:  Altered mental status.  Hyperglycemia.  EXAM: PORTABLE CHEST - 1 VIEW  COMPARISON:  None.  FINDINGS: There has been previous  median sternotomy and aortic valve replacement. Heart size is normal. The lungs are clear. The vascularity is normal. No effusions.  IMPRESSION: Previous AVR.  No active disease.   Electronically Signed   By: Paulina FusiMark  Shogry M.D.   On: 03/08/2014 09:04   Dg Abd Portable 1v  03/08/2014   CLINICAL DATA:  NG tube placement.  EXAM: PORTABLE ABDOMEN - 1 VIEW  COMPARISON:  None.  FINDINGS: NG tube coils in the fundus. The catheter then passes distally with the tip in the antrum of the stomach.  Calcified fibroid in the pelvis. Nonobstructive bowel gas pattern. No free air organomegaly.  IMPRESSION: NG tube coils in the fundus with the tip in the antrum.   Electronically Signed   By: Charlett NoseKevin  Dover M.D.   On: 03/08/2014 13:53   CRITICAL CARE  RE severe metabolic acidosis, dka, dehydration, altered mental status.  Performed by: Suzi RootsKevin E Haedyn Breau Total critical care time: 2345 Critical care time was exclusive of separately billable procedures and treating other patients. Critical care was necessary to treat or prevent imminent or life-threatening deterioration. Critical care was time spent personally by me on the following activities: development of treatment plan with patient and/or surrogate as well as nursing, discussions with consultants, evaluation of patient's response to treatment, examination of patient, obtaining history from patient or surrogate, ordering and performing treatments and interventions, ordering and review of laboratory studies, ordering and review of radiographic studies, pulse oximetry and re-evaluation of patient's condition.     Suzi RootsKevin E Caylon Saine, MD 03/09/14 [phone]

## 2014-03-09 NOTE — Progress Notes (Signed)
Rehab Admissions Coordinator Note:  Patient was screened by Clois Dupes for appropriateness for an Inpatient Acute Rehab Consult per PT recommendation. At this time, we are recommending Inpatient Rehab consult. Please place order.  Foye Spurling [facility] 03/09/2014, 3:14 PM  [phone]

## 2014-03-09 NOTE — Progress Notes (Addendum)
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 Per EMS report patient last seen normal last night. Her son called EMS because of AMS. The patient was found to be unresponsive to painful stimuli.  CBG on BMP 1124 in ED. Lactic acid 9.81 and high AG with bicarbonate <7.0. PCCM called for admission. Patient is intubated in ED. Gastric fluid blood positive. CT-head showed ischemic stroke.   SIGNIFICANT EVENTS / STUDIES:   ETT 4/20 CT-head 4/20: Right frontal lobe infarct which has a subacute appearance.    LINES / TUBES: ETT 4/20>>> CVL Left Ij 4/20 >>> NG tube 4/20>>>  CULTURES: Blood culture 4/20>>>  ANTIBIOTICS: 4/20 vanco>>> 4/20 Unasyn>>>  SUBJECTIVE:   4/21: the patient is indurated and sedated. Tm 101.2  VITAL SIGNS: Temp:  [98.4 F (36.9 C)-101.2 F (38.4 C)] 100.1 F (37.8 C) (04/21 0600) Pulse Rate:  [87-143] 97 (04/21 0600) Resp:  [16-43] 35 (04/21 0600) BP: (73-140)/(24-97) 111/51 mmHg (04/21 0600) SpO2:  [95 %-100 %] 98 % (04/21 0600) FiO2 (%):  [30 %-100 %] 30 % (04/21 0600) Weight:  [173 lb 8 oz (78.7 kg)] 173 lb 8 oz (78.7 kg) (04/21 0437) HEMODYNAMICS: CVP:  [6 mmHg-9 mmHg] 6 mmHg VENTILATOR SETTINGS: Vent Mode:  [-] PRVC FiO2 (%):  [30 %-100 %] 30 % Set Rate:  [35 bmp] 35 bmp Vt Set:  [500 mL] 500 mL PEEP:  [5 cmH20] 5 cmH20 Plateau Pressure:  [17 cmH20-22 cmH20] 20 cmH20 INTAKE / OUTPUT: Intake/Output     04/20 0701 - 04/21 0700   I.V. (mL/kg) 6353.9 (80.7)   IV Piggyback 1202   Total Intake(mL/kg)  7555.9 (96)   Urine (mL/kg/hr) 1730   Total Output 1730   Net +5825.9         PHYSICAL EXAMINATION:  General: intubated and sedated with fentanyl HEENT: pupil sluggish to light, No JVD Pulm: CTAB   CV: s1/S2, tachycardia, No murmur Abd: soft, non tender, bowel sound active Ext: No leg edema Neuro: positive Babinski on the left Skin: no rash LABS:  CBC  Recent Labs Lab 03/08/14 1900 03/09/14 0004 03/09/14 0415  WBC 20.5* 17.2* 15.9*  HGB 10.3* 10.4* 10.3*  HCT 30.4* 29.6* 29.6*  PLT 253 212 203   Coag's  Recent Labs Lab 03/08/14 0831 03/09/14 0415  APTT  --  37  INR 1.37 1.90*   BMET  Recent Labs Lab 03/08/14 2210 03/09/14 0004 03/09/14 0415  NA 143 141 142  K 3.9 4.4 3.2*  CL 114* 113* 111  CO2 14* 14* 14*  BUN 25* 24* 21  CREATININE 1.11* 1.08 1.07  GLUCOSE 150* 179* 180*   Electrolytes  Recent Labs Lab 03/08/14 1646  03/08/14 2210 03/09/14 0004 03/09/14 0415  CALCIUM 8.2*  < > 8.0* 7.8* 7.8*  MG 2.0  --   --   --   --   PHOS 1.0*  --   --   --   --   < > = values in this interval not displayed. Sepsis Markers  Recent Labs Lab 03/08/14  5625 03/08/14 1313 03/08/14 1900 03/09/14 0413 03/09/14 0415  LATICACIDVEN 9.81* 3.4* 3.1* 2.0  --   PROCALCITON  --  2.06  --   --  2.07   ABG  Recent Labs Lab 03/08/14 1202 03/08/14 1851  PHART 7.062* 7.418  PCO2ART 15.2* 22.6*  PO2ART 259.0* 244.0*   Liver Enzymes  Recent Labs Lab 03/08/14 0831  AST 16  ALT 15  ALKPHOS 133*  BILITOT <0.2*  ALBUMIN 3.7   Cardiac Enzymes  Recent Labs Lab 03/08/14 1646 03/08/14 2210 03/09/14 0416  TROPONINI <0.30 0.32* 0.43*   Glucose  Recent Labs Lab 03/08/14 2309 03/09/14 0016 03/09/14 0110 03/09/14 0204 03/09/14 0307 03/09/14 0418  GLUCAP 126* 178* 145* 170* 189* 176*    Imaging Ct Head Wo Contrast  03/08/2014   CLINICAL DATA:  Altered mental status  EXAM: CT HEAD WITHOUT CONTRAST  TECHNIQUE: Contiguous axial images were  obtained from the base of the skull through the vertex without intravenous contrast.  COMPARISON:  None.  FINDINGS: Extensive chronic ischemic changes are present. There is severe encephalomalacia in the right parietal lobe. Extensive chronic ischemic changes in the periventricular white matter. Encephalomalacia from focal infarcts in the left occipital and parietal lobes is also present. There is low-density in the gray matter and adjacent white matter of the right frontal lobe with effacement of the sulci and intermediate Hounsfield unit measurements. This has a subacute appearance. No evidence of acute intracranial hemorrhage. No midline shift. Mild global atrophy.  IMPRESSION: Extensive chronic ischemic changes as described. 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   Electronically Signed   By: Maryclare Bean M.D.   On: 03/08/2014 18:04   Dg Chest Port 1 View  03/08/2014   CLINICAL DATA:  Central line placement.  EXAM: PORTABLE CHEST - 1 VIEW  COMPARISON:  03/08/2014  FINDINGS: Prior median sternotomy and valve replacement. Endotracheal tube remains approximately 2 cm above the carina. Right central line is in place with the tip at the cavoatrial junction. No pneumothorax.  Hyperinflation of the lungs. No confluent opacities. No effusions. No acute bony abnormality.  IMPRESSION: Right central line tip at the cavoatrial junction.  No pneumothorax.  Mild hyperinflation.  No acute cardiopulmonary disease.   Electronically Signed   By: Charlett Nose M.D.   On: 03/08/2014 13:49   Dg Chest Portable 1 View  03/08/2014   CLINICAL DATA:  Intubation, central line placement  EXAM: PORTABLE CHEST - 1 VIEW  COMPARISON:  Portable exam 1116 hr compared to 03/08/2014 at 0849 hr  FINDINGS: Tip of endotracheal tube projects 2.2 cm above carinal.  No central venous catheter identified.  Normal heart size post AVR.  Mediastinal contours and pulmonary vascularity normal.  Lungs clear.  No pleural  effusion, pneumothorax or acute osseous findings.  IMPRESSION: No acute abnormalities.   Electronically Signed   By: Ulyses Southward M.D.   On: 03/08/2014 11:41   Dg Chest Portable 1 View  03/08/2014   CLINICAL DATA:  Altered mental status.  Hyperglycemia.  EXAM: PORTABLE CHEST - 1 VIEW  COMPARISON:  None.  FINDINGS: There has been previous median sternotomy and aortic valve replacement. Heart size is normal. The lungs are clear. The vascularity is normal. No effusions.  IMPRESSION: Previous AVR.  No active disease.   Electronically Signed   By: Paulina Fusi M.D.   On: 03/08/2014 09:04   Dg Abd Portable 1v  03/08/2014   CLINICAL DATA:  NG tube  placement.  EXAM: PORTABLE ABDOMEN - 1 VIEW  COMPARISON:  None.  FINDINGS: NG tube coils in the fundus. The catheter then passes distally with the tip in the antrum of the stomach.  Calcified fibroid in the pelvis. Nonobstructive bowel gas pattern. No free air organomegaly.  IMPRESSION: NG tube coils in the fundus with the tip in the antrum.   Electronically Signed   By: Charlett Nose M.D.   On: 03/08/2014 13:53   ASSESSMENT / PLAN:   CXR: negative for acute issues  ASSESSMENT / PLAN:  PULMONARY A: acute respiratory failure in the setting of severe DKA > resolved P:   - extubate this morning - SLP consult post extubation  CARDIOVASCULAR A:   - hx of aortic valve replacement, coumadin on the home med list, but INR 1.37 on admission - History of HTN - 2D echo on 03/02/13-->EF normal - HLD - shock, hypovolemia, r/o septic - NSTEM: trop up 0.32-->0.43, demanding ishemia  P:   - hold home Bp meds - IVF - resume heparin gtt - hold home statin due to NPO - trop q6h x 3 - levophed to MAP goal 60  RENAL A:    - AKI, cre 1.86-->1.07, most likely prerenal due to dehydration - UA negative for UTI - hyperkalemia: resolved -  severe DKA and metobolic acidosis, bicarbonate <7.0   -  Lactic acid 9.81 -  K=7.2 on admission P:   - IVF: NS 150 cc/h - f/u  BMP K  - IVF: NS 150cc/h - Insulin gtt per DKA protocol - BMP q4h    GASTROINTESTINAL A:    -  hx of GERD on PPI at home - positive gastric fluid blood: likely stress ulcers. Hgb stable-->hgb 10.3-->10.3, but no clear sign of bleeding 4/21 AM  P:    - IV protonix - watch H/H carefully on 4/21 after starting heparin  HEMATOLOGIC A:    - leukocytosis, WBC 27-->15.9, most likely 2/2 to DKA / hemoconcetration, needs to r/o infectin - PCT 2.07 - Aortic valve replacement P:   - f/u CBC - resume heparin gtt, watch h/h carefully   INFECTIOUS A:   - No clear source of infection, but has leukocytosis (WBC 27), most likely 2/2 to DKA. R/o asp P:    -  IV vanco and unasyn for empiric coverage -  pending blood culture x 2 - trend Lactic acid q8h - PCT protocol to limit abx if no source ID  ENDOCRINE A:    - DM-II, A1c 10.2, now DKA / HONK, GA better - no hx of thyroid dysfunction - hx of RA, not on steroid - cortisol level 25  P:   - insulin gtt as above - IVF  NEUROLOGIC A:   - Acute metobolic encephalopathy, 2/2 to DKA, h/o CVA now subther on coumadin - subacute frontal lob ischemic stroke-->neurology consulted, recommended IV heparin  P:   -NPO -frequent neuro check -fent for sedation   Lorretta Harp, MD PGY3, Internal Medicine Teaching Service Pager: [phone]  Attending:  I have seen and examined the patient with nurse practitioner/resident and agree with and have edited the note above.   CC time 45 minutes  Heber [place], MD [place] [facility] Pager: [phone] Cell: [phone] If no response, call [phone]   03/09/2014, 6:34 AM

## 2014-03-09 NOTE — Progress Notes (Signed)
ANTICOAGULATION CONSULT NOTE  Pharmacy Consult for Heparin  Indication: Aortic Valve on coumadin PTA  Allergies  Allergen Reactions  . Celebrex [Celecoxib] Rash  . Detrol [Tolterodine]     Patient Measurements: Height: 5\' 8"  (172.7 cm) Weight: 173 lb 8 oz (78.7 kg) IBW/kg (Calculated) : 63.9 Heparin Dosing Weight:   Vital Signs: Temp: 99.6 F (37.6 C) (04/21 1430) Temp src: Core (Comment) (04/21 1200) BP: 103/55 mmHg (04/21 1430) Pulse Rate: 103 (04/21 1430)  Labs:  Recent Labs  03/08/14 0831  03/08/14 1646 03/08/14 1900 03/08/14 2210 03/09/14 0004 03/09/14 0415 03/09/14 0416 03/09/14 0800 03/09/14 0957 03/09/14 1200 03/09/14 1700  HGB 12.1  --   --  10.3*  --  10.4* 10.3*  --   --   --   --  9.7*  HCT 38.8  --   --  30.4*  --  29.6* 29.6*  --   --   --   --  27.6*  PLT 351  --   --  253  --  212 203  --   --   --   --  146*  APTT  --   --   --   --   --   --  37  --   --   --   --   --   LABPROT 16.5*  --   --   --   --   --  21.2*  --   --   --   --   --   INR 1.37  --   --   --   --   --  1.90*  --   --   --   --   --   HEPARINUNFRC  --   --   --   --   --   --   --   --   --   --   --  0.73*  CREATININE 1.82*  < > 1.38* 1.18* 1.11* 1.08 1.07  --  0.96  --  0.95  --   CKTOTAL  --   --   --  155  --   --   --   --   --   --   --   --   TROPONINI  --   < > <0.30  --  0.32*  --   --  0.43*  --  0.52*  --   --   < > = values in this interval not displayed.  Estimated Creatinine Clearance: 75.5 ml/min (by C-G formula based on Cr of 0.95).   Assessment: 78 yoF, presented with AMS, hyperglycemia, and unresponsive. Patient was intubated in ED and transferred to ICU.  Patient has a history of aortic valve replacement and on coumadin prior to admission.  She is to start on heparin with coumadin bridge.    PM heparin level slightly supra-therapeutic at 0.73  Goal of Therapy:  INR 2-3 Monitor platelets by anticoagulation protocol: Yes HL goal 0.3-0.7   Plan:   Decrease heparin to 850 units / hr Follow up AM labs  Thank you. 40, PharmD [phone]  03/09/2014,5:45 PM

## 2014-03-09 NOTE — Evaluation (Signed)
Clinical/Bedside Swallow Evaluation Patient Details  Name: Madison [name] MRN: [id number] Date of Birth: [birth year]  Today's Date: 03/09/2014 Time: 1411-1430 SLP Time Calculation (min): 19 min  Past Medical History:  Past Medical History  Diagnosis Date  . CAD (coronary artery disease)   . Obesity   . Hypercholesteremia   . HTN (hypertension)   . Dyslipidemia   . Stroke syndrome   . Aortic stenosis   . Thrombophlebitis   . Diabetes   . Hyperlipidemia   . Rheumatoid arthritis(714.0)    Past Surgical History:  Past Surgical History  Procedure Laterality Date  . Cesarean section    . Foot fracture surgery    . Knee surgery    . Neuroplasty / transposition median nerve at carpal tunnel     HPI:  Madison [name] is an 53 y.o. female history of diabetes mellitus, hypertension, hyperlipidemia, previous stroke, DVT and aortic valve replacement, subtherapeutic on Coumadin, who was admitted for acute respiratory failure and DKA. Patient was intubated in the emergency room. Initial blood sugar was 1124. Patient was found unresponsive by family members. She was last seen well on the evening of 03/07/2014. CT scan of her head pain today showed an old posterior right MCA stroke as well as an acute/subacute anterior frontal ischemic stroke   Assessment / Plan / Recommendation Clinical Impression  Pt is confused and disoriented to time/situation; voice is hoarse s/p extubation, but appears to be tolerating POs well with no s/s of dysphagia.  RR compatible with swallowing sequence; no s/s of aspiration.  Pt c/o discomfort chewing solids given absence of upper dentures.  Recommend Dysphagia 3 with chopped meats, thin liquids - can be upgraded to regular once dentures are available.     Please order speech/language eval - thanks.     Aspiration Risk  Mild    Diet Recommendation Dysphagia 3 (Mechanical Soft);Thin liquid   Liquid Administration via: Cup Medication Administration: Whole meds with  liquid Supervision: Patient able to self feed (assist as needed)    Other  Recommendations Oral Care Recommendations: Oral care BID   Follow Up Recommendations  None    Swallow Study Prior Functional Status       General Date of Onset: 03/08/14 HType of Study: Bedside swallow evaluation Previous Swallow Assessment: none per records Diet Prior to this Study: NPO Temperature Spikes Noted: No Respiratory Status: Nasal cannula History of Recent Intubation: Yes Length of Intubations (days): 1 days Date extubated: 03/09/14 Behavior/Cognition: Alert;Cooperative;Confused Oral Cavity - Dentition: Missing dentition Self-Feeding Abilities: Needs assist Patient Positioning: Upright in bed Baseline Vocal Quality: Hoarse Volitional Cough: Weak Volitional Swallow: Able to elicit    Oral/Motor/Sensory Function Overall Oral Motor/Sensory Function: Appears within functional limits for tasks assessed   Ice Chips Ice chips: Within functional limits Presentation: Spoon   Thin Liquid Thin Liquid: Within functional limits Presentation: Cup    Nectar Thick Nectar Thick Liquid: Not tested   Honey Thick Honey Thick Liquid: Not tested   Puree Puree: Within functional limits   Solid  Madison [name] L. [place], [place] CCC/SLP Pager [phone]     Solid: Impaired Presentation: Self Fed Oral Phase Impairments: Impaired mastication (due to absence of dentures/ states hurts to  chew) Oral Phase Functional Implications:  (c/o discomfort when eating crackers - upper dentures not pre)       Madison [name] Madison [name] 03/09/2014,2:42 PM

## 2014-03-10 DIAGNOSIS — I6330 Cerebral infarction due to thrombosis of unspecified cerebral artery: Secondary | ICD-10-CM

## 2014-03-10 LAB — BASIC METABOLIC PANEL
BUN: 9 mg/dL (ref 6–23)
CHLORIDE: 111 meq/L (ref 96–112)
CO2: 20 mEq/L (ref 19–32)
Calcium: 7.8 mg/dL — ABNORMAL LOW (ref 8.4–10.5)
Creatinine, Ser: 0.82 mg/dL (ref 0.50–1.10)
GFR calc Af Amer: >90 mL/min (ref >90)
GFR calc non Af Amer: 80 mL/min — ABNORMAL LOW (ref >90)
GLUCOSE: 246 mg/dL — AB (ref 70–99)
Potassium: 3.9 mEq/L (ref 3.7–5.3)
Sodium: 141 mEq/L (ref 137–147)

## 2014-03-10 LAB — LIPID PANEL
Cholesterol: 111 mg/dL (ref 0–200)
HDL: 43 mg/dL (ref >39)
LDL CALC: 50 mg/dL (ref 0–99)
TRIGLYCERIDES: 90 mg/dL (ref <150)
Total CHOL/HDL Ratio: 2.6 RATIO
VLDL: 18 mg/dL (ref 0–40)

## 2014-03-10 LAB — HEPARIN LEVEL (UNFRACTIONATED)
HEPARIN UNFRACTIONATED: 0.63 [IU]/mL (ref 0.30–0.70)
Heparin Unfractionated: 0.49 IU/mL (ref 0.30–0.70)

## 2014-03-10 LAB — PHOSPHORUS
PHOSPHORUS: 2.5 mg/dL (ref 2.3–4.6)
Phosphorus: 1.4 mg/dL — ABNORMAL LOW (ref 2.3–4.6)

## 2014-03-10 LAB — CBC
HEMATOCRIT: 26.9 % — AB (ref 36.0–46.0)
HEMOGLOBIN: 9.3 g/dL — AB (ref 12.0–15.0)
MCH: 28.7 pg (ref 26.0–34.0)
MCHC: 34.6 g/dL (ref 30.0–36.0)
MCV: 83 fL (ref 78.0–100.0)
Platelets: 132 10*3/uL — ABNORMAL LOW (ref 150–400)
RBC: 3.24 MIL/uL — ABNORMAL LOW (ref 3.87–5.11)
RDW: 13.9 % (ref 11.5–15.5)
WBC: 9.3 10*3/uL (ref 4.0–10.5)

## 2014-03-10 LAB — GLUCOSE, CAPILLARY
GLUCOSE-CAPILLARY: 89 mg/dL (ref 70–99)
Glucose-Capillary: 150 mg/dL — ABNORMAL HIGH (ref 70–99)
Glucose-Capillary: 226 mg/dL — ABNORMAL HIGH (ref 70–99)
Glucose-Capillary: 368 mg/dL — ABNORMAL HIGH (ref 70–99)

## 2014-03-10 LAB — HEMOGLOBIN A1C
HEMOGLOBIN A1C: 12 % — AB (ref <5.7)
MEAN PLASMA GLUCOSE: 298 mg/dL — AB (ref <117)

## 2014-03-10 LAB — PROTIME-INR
INR: 2.14 — ABNORMAL HIGH (ref 0.00–1.49)
PROTHROMBIN TIME: 23.2 s — AB (ref 11.6–15.2)

## 2014-03-10 LAB — PROCALCITONIN: PROCALCITONIN: 0.86 ng/mL

## 2014-03-10 LAB — MAGNESIUM
Magnesium: 2 mg/dL (ref 1.5–2.5)
Magnesium: 1.9 mg/dL (ref 1.5–2.5)

## 2014-03-10 LAB — TROPONIN I

## 2014-03-10 MED ORDER — INSULIN ASPART 100 UNIT/ML ~~LOC~~ SOLN
0.0000 [IU] | Freq: Three times a day (TID) | SUBCUTANEOUS | Status: DC
  Administered 2014-03-10: 12:00:00 via SUBCUTANEOUS
  Administered 2014-03-11: 3 [IU] via SUBCUTANEOUS
  Administered 2014-03-11: 5 [IU] via SUBCUTANEOUS
  Administered 2014-03-11: 15 [IU] via SUBCUTANEOUS
  Administered 2014-03-12: 8 [IU] via SUBCUTANEOUS

## 2014-03-10 MED ORDER — WARFARIN SODIUM 2.5 MG PO TABS
2.5000 mg | ORAL_TABLET | Freq: Once | ORAL | Status: DC
  Filled 2014-03-10: qty 1

## 2014-03-10 MED ORDER — SIMVASTATIN 10 MG PO TABS
10.0000 mg | ORAL_TABLET | Freq: Every day | ORAL | Status: DC
  Administered 2014-03-11: 10 mg via ORAL
  Filled 2014-03-10 (×3): qty 1

## 2014-03-10 MED ORDER — HEPARIN (PORCINE) IN NACL 100-0.45 UNIT/ML-% IJ SOLN
900.0000 [IU]/h | INTRAMUSCULAR | Status: DC
  Administered 2014-03-11: 800 [IU]/h via INTRAVENOUS
  Administered 2014-03-12: 900 [IU]/h via INTRAVENOUS
  Filled 2014-03-10 (×3): qty 250

## 2014-03-10 MED ORDER — ASPIRIN 81 MG PO CHEW
81.0000 mg | CHEWABLE_TABLET | Freq: Every day | ORAL | Status: DC
  Administered 2014-03-10 – 2014-03-12 (×3): 81 mg via ORAL
  Filled 2014-03-10 (×3): qty 1

## 2014-03-10 NOTE — Progress Notes (Addendum)
ANTICOAGULATION CONSULT NOTE  Pharmacy Consult for Heparin + Coumadin Indication: Aortic Valve on coumadin PTA  Allergies  Allergen Reactions  . Celebrex [Celecoxib] Rash  . Detrol [Tolterodine]     Patient Measurements: Height: 5\' 8"  (172.7 cm) Weight: 173 lb 4.5 oz (78.6 kg) IBW/kg (Calculated) : 63.9 Heparin Dosing Weight:   Vital Signs: Temp: 99.8 F (37.7 C) (04/22 0600) Temp src: Core (Comment) (04/21 2000) BP: 113/55 mmHg (04/22 0710) Pulse Rate: 93 (04/22 0730)  Labs:  Recent Labs  03/08/14 0831  03/08/14 1646 03/08/14 1900  03/09/14 0004 03/09/14 0415  03/09/14 0957  03/09/14 1600 03/09/14 1700 03/09/14 2030 03/10/14 0350  HGB 12.1  --   --  10.3*  --  10.4* 10.3*  --   --   --   --  9.7*  --  9.3*  HCT 38.8  --   --  30.4*  --  29.6* 29.6*  --   --   --   --  27.6*  --  26.9*  PLT 351  --   --  253  --  212 203  --   --   --   --  146*  --  132*  APTT  --   --   --   --   --   --  37  --   --   --   --   --   --   --   LABPROT 16.5*  --   --   --   --   --  21.2*  --   --   --   --   --   --  23.2*  INR 1.37  --   --   --   --   --  1.90*  --   --   --   --   --   --  2.14*  HEPARINUNFRC  --   --   --   --   --   --   --   --   --   --   --  0.73*  --  0.63  CREATININE 1.82*  < > 1.38* 1.18*  < > 1.08 1.07  < >  --   < > 0.85  --  0.87 0.82  CKTOTAL  --   --   --  155  --   --   --   --   --   --   --   --   --   --   TROPONINI  --   < > <0.30  --   < >  --   --   < > 0.52*  --   --   --  0.31* <0.30  < > = values in this interval not displayed.  Estimated Creatinine Clearance: 87.4 ml/min (by C-G formula based on Cr of 0.82).   Assessment: 31 yoF, presented with AMS, hyperglycemia, and unresponsive. Patient was intubated in ED and transferred to ICU.  Patient has a history of aortic valve replacement and on coumadin prior to admission.  Patient also had a positive CT for a subacute right frontal infarct.  Heparin was stopped for about 1 hour this  morning due to a therapeutic INR.  However, at the request of Dr. 40, heparin was restarted on heparin with coumadin bridge with a higher INR goal.  PM heparin level slightly supra-therapeutic at 0.73.  HL this morning is therapeutic at the higher end at  0.63. INR today is 2.14 and therapeutic.   Hemoglobin and hematicrit are stable at 9.3/26.9, and platelets have trended down to 132.  No bleeding in RN notes.   Goal of Therapy:  INR 2.5-3.5 (goal per Dr. Pearlean Brownie on 4/22) INR goal from previous anticoagulation notes 2-3 Monitor platelets by anticoagulation protocol: Yes HL goal 0.3-0.5   Plan:  -Restart Heparin 800 units/hr -Check HL at 1800 -Continue coumadin 2.5 mg x1 -F/up daily cbc/HL and INR, monitor for signs symptoms of bleeding  Anabel Bene, PharmD Clinical Pharmacist Resident Pager: [phone] 03/10/2014,7:52 AM

## 2014-03-10 NOTE — Progress Notes (Signed)
Inpatient Diabetes Program Recommendations  AACE/ADA: New Consensus Statement on Inpatient Glycemic Control (2013)  Target Ranges:  Prepandial:   less than 140 mg/dL      Peak postprandial:   less than 180 mg/dL (1-2 hours)      Critically ill patients:  140 - 180 mg/dL   Reason for Assessment:  Patient transitioned off insulin drip   Diabetes history: Type 2 diabetes-A1C pending however in January it was 10.2% Outpatient Diabetes medications: Novolog 70/30 40 units daily Current orders for Inpatient glycemic control: Levemir 10 units daily, and TCTS correction q 4 hours  May consider increasing Levemir to 20 units daily and add Novolog meal coverage 5 units tid with meals.  If meal coverage added, please decrease Novolog to moderate Novolog correction.  Will follow. Needs close follow-up with PCP.  Beryl Meager, RN, BC-ADM Inpatient Diabetes Coordinator Pager [phone]

## 2014-03-10 NOTE — Progress Notes (Signed)
Attempted report x1. RN will call back. 

## 2014-03-10 NOTE — Progress Notes (Signed)
VASCULAR LAB PRELIMINARY  PRELIMINARY  PRELIMINARY  PRELIMINARY  Carotid duplex completed.    Preliminary report:  Right;   Unable to image more than the CCA due to central line/dressings.  Left:  1-39% ICA stenosis.  Vertebral antegrade.  Gara Kroner, RVT 03/10/2014, 10:29 AM

## 2014-03-10 NOTE — Progress Notes (Signed)
Physical Therapy Treatment Patient Details Name: Madison [name] MRN: [id number] DOB: 25-Aug-1961 Today's Date: 03/10/2014    History of Present Illness pt presents with DKA, Encephalopathy, Anterior Frontal Ischemic Infarct, with hx of R MCA Infarct.      PT Comments    Pt admitted with above. Pt currently with functional limitations due to balance and endurance deficits.  Pt will benefit from skilled PT to increase their independence and safety with mobility to allow discharge to the venue listed below.   Follow Up Recommendations  CIR     Equipment Recommendations  Other (comment) (TBA)    Recommendations for Other Services       Precautions / Restrictions Precautions Precautions: Fall Restrictions Weight Bearing Restrictions: No    Mobility  Bed Mobility               General bed mobility comments: in chair  Transfers Overall transfer level: Needs assistance Equipment used: 1 person hand held assist Transfers: Sit to/from Stand Sit to Stand: Min guard         General transfer comment: cues for UE use.    Ambulation/Gait Ambulation/Gait assistance: Min guard;Min assist Ambulation Distance (Feet): 350 Feet Assistive device: None;1 person hand held assist Gait Pattern/deviations: Step-to pattern;Decreased stride length;Decreased step length - left;Drifts right/left   Gait velocity interpretation: Below normal speed for age/gender General Gait Details: Pt drifts left when ambulating and occasionally bumped into wall and objects on left.  Pt did not lose balance and was not using a device during large portion of ambulation.  Pt states she is at baseline.     Stairs            Wheelchair Mobility    Modified Rankin (Stroke Patients Only)       Balance           Standing balance support: Single extremity supported;No upper extremity supported;During functional activity Standing balance-Leahy Scale: Fair Standing balance comment: Pt does not  have to have UE support to stand statically.               High level balance activites: Direction changes;Turns High Level Balance Comments: Above with min guard assist without LOB.    Cognition Arousal/Alertness: Awake/alert Behavior During Therapy: WFL for tasks assessed/performed Overall Cognitive Status: Impaired/Different from baseline Area of Impairment: Attention;Memory;Awareness;Problem solving   Current Attention Level: Selective Memory: Decreased short-term memory     Awareness: Emergent Problem Solving: Slow processing;Difficulty sequencing;Requires verbal cues;Requires tactile cues      Exercises General Exercises - Lower Extremity Ankle Circles/Pumps: AROM;Both;10 reps;Seated Long Arc Quad: AROM;Both;10 reps;Seated Hip ABduction/ADduction: AROM;Both;10 reps;Seated Hip Flexion/Marching: AROM;Both;10 reps;Seated    General Comments        Pertinent Vitals/Pain 100-131 bpm, No pain    Home Living                      Prior Function            PT Goals (current goals can now be found in the care plan section) Progress towards PT goals: Progressing toward goals    Frequency  Min 4X/week    PT Plan Current plan remains appropriate    Co-evaluation             End of Session Equipment Utilized During Treatment: Gait belt Activity Tolerance: Patient limited by fatigue Patient left: with call bell/phone within reach;in chair     Time: 0950-1015 PT Time Calculation (min): 25 min  Charges:  $Gait Training: 8-22 mins $Therapeutic Exercise: 8-22 mins                    G Codes:      Barb Merino March 17, 2014, 10:16 AM [facility] [phone] (pager)

## 2014-03-10 NOTE — Progress Notes (Signed)
ANTICOAGULATION CONSULT NOTE - Follow Up Consult  Pharmacy Consult for Heparin Indication: AVR  Allergies  Allergen Reactions  . Celebrex [Celecoxib] Rash  . Detrol [Tolterodine]     Patient Measurements: Height: 5\' 8"  (172.7 cm) Weight: 173 lb 4.5 oz (78.6 kg) IBW/kg (Calculated) : 63.9 Heparin Dosing Weight: 78.6 kg  Vital Signs: Temp: 98.8 F (37.1 C) (04/22 1433) Temp src: Oral (04/22 1433) BP: 123/77 mmHg (04/22 1433) Pulse Rate: 78 (04/22 1433)  Labs:  Recent Labs  03/08/14 0831  03/08/14 1646 03/08/14 1900  03/09/14 0004 03/09/14 0415  03/09/14 0957  03/09/14 1600 03/09/14 1700 03/09/14 2030 03/10/14 0350 03/10/14 1912  HGB 12.1  --   --  10.3*  --  10.4* 10.3*  --   --   --   --  9.7*  --  9.3*  --   HCT 38.8  --   --  30.4*  --  29.6* 29.6*  --   --   --   --  27.6*  --  26.9*  --   PLT 351  --   --  253  --  212 203  --   --   --   --  146*  --  132*  --   APTT  --   --   --   --   --   --  37  --   --   --   --   --   --   --   --   LABPROT 16.5*  --   --   --   --   --  21.2*  --   --   --   --   --   --  23.2*  --   INR 1.37  --   --   --   --   --  1.90*  --   --   --   --   --   --  2.14*  --   HEPARINUNFRC  --   --   --   --   --   --   --   --   --   --   --  0.73*  --  0.63 0.49  CREATININE 1.82*  < > 1.38* 1.18*  < > 1.08 1.07  < >  --   < > 0.85  --  0.87 0.82  --   CKTOTAL  --   --   --  155  --   --   --   --   --   --   --   --   --   --   --   TROPONINI  --   < > <0.30  --   < >  --   --   < > 0.52*  --   --   --  0.31* <0.30  --   < > = values in this interval not displayed.  Estimated Creatinine Clearance: 87.4 ml/min (by C-G formula based on Cr of 0.82).   Medications:  Heparin @ 800 units/hr (8 ml/hr)  Assessment: 53 YOF who continues on a heparin bridge to therapeutic INR with warfarin for hx AVR. Heparin level this evening is therapeutic after resuming the drip this morning (HL 0.49, goal of 0.3-0.5)  Goal of Therapy:  Heparin  level 0.3-0.5 units/ml Monitor platelets by anticoagulation protocol: Yes   Plan:  1. Continue heparin at 800 units/hr (8 ml/hr) 2. Will continue  to monitor for any signs/symptoms of bleeding and will follow up with heparin level in 6 hours to confirm therapeutic   Georgina Pillion, PharmD, BCPS Clinical Pharmacist Pager: [phone] 03/10/2014 8:49 PM

## 2014-03-10 NOTE — Progress Notes (Signed)
Stroke Team Progress Note  HISTORY Madison [name] is an 53 y.o. female history of diabetes mellitus, hypertension, hyperlipidemia, previous stroke, DVT and aortic valve replacement, subtherapeutic on Coumadin, who was admitted 03/08/2014 for acute respiratory failure and DKA. Patient was intubated in the emergency room. Initial blood sugar was 1124. Patient was found unresponsive by family members. She was last seen well on the evening of 03/07/2014. 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 INR on admission was 1.37. Patient was not administered TPA secondary to delay in arrival. She was admitted to the ICU for further evaluation and treatment.  SUBJECTIVE Patient up in chair at bedside. She feels like her confusion is improved.   OBJECTIVE Most recent Vital Signs: Filed Vitals:   03/10/14 0715 03/10/14 0730 03/10/14 0800 03/10/14 0808  BP:   109/48   Pulse: 93 93 93   Temp:    98.5 F (36.9 C)  TempSrc:    Oral  Resp: '27 26 28   ' Height:      Weight:      SpO2: 99% 100% 99%    CBG (last 3)   Recent Labs  03/09/14 2335 03/10/14 0344 03/10/14 0719  GLUCAP 150* 226* 89    IV Fluid Intake:   . dextrose 5 % and 0.45% NaCl 150 mL/hr at 03/10/14 0530  . norepinephrine (LEVOPHED) Adult infusion Stopped (03/09/14 1306)    MEDICATIONS  . insulin aspart  0-24 Units Subcutaneous 6 times per day  . insulin detemir  10 Units Subcutaneous Daily  . pantoprazole (PROTONIX) IV  40 mg Intravenous Q24H  . ramelteon  8 mg Oral QHS  . sodium chloride  3 mL Intravenous Q12H  . warfarin  2.5 mg Oral ONCE-1800  . Warfarin - Pharmacist Dosing Inpatient   Does not apply q1800   PRN:  dextrose, sodium chloride  Diet:  Carb Control thin liquids Activity:  Bedrest, up in chair, progress activity DVT Prophylaxis:  Heparin 5000 units sq tid   CLINICALLY SIGNIFICANT STUDIES Basic Metabolic Panel:   Recent  Labs Lab 03/09/14 1700 03/09/14 2030 03/10/14 0350  NA  --  137 141  K  --  3.9 3.9  CL  --  111 111  CO2  --  17* 20  GLUCOSE  --  213* 246*  BUN  --  9 9  CREATININE  --  0.87 0.82  CALCIUM  --  8.0* 7.8*  MG 2.2  --  2.0  PHOS 2.3  --  2.5   Liver Function Tests:   Recent Labs Lab 03/08/14 0831  AST 16  ALT 15  ALKPHOS 133*  BILITOT <0.2*  PROT 7.5  ALBUMIN 3.7   CBC:   Recent Labs Lab 03/09/14 1700 03/10/14 0350  WBC 11.4* 9.3  HGB 9.7* 9.3*  HCT 27.6* 26.9*  MCV 83.4 83.0  PLT 146* 132*   Coagulation:   Recent Labs Lab 03/08/14 0831 03/09/14 0415 03/10/14 0350  LABPROT 16.5* 21.2* 23.2*  INR 1.37 1.90* 2.14*   Cardiac Enzymes:   Recent Labs Lab 03/08/14 1646 03/08/14 1900  03/09/14 0957 03/09/14 2030 03/10/14 0350  CKTOTAL  --  155  --   --   --   --   TROPONINI <0.30  --   < > 0.52* 0.31* <0.30  < > = values in this interval not displayed. Urinalysis:   Recent Labs Lab 03/08/14 0840 03/08/14  Angelica  LABSPEC 1.027 1.025  PHURINE 5.0 5.0  GLUCOSEU >1000* >1000*  HGBUR NEGATIVE MODERATE*  BILIRUBINUR NEGATIVE MODERATE*  KETONESUR 40* 40*  PROTEINUR NEGATIVE 30*  UROBILINOGEN 0.2 1.0  NITRITE NEGATIVE NEGATIVE  LEUKOCYTESUR NEGATIVE NEGATIVE   Lipid Panel    Component Value Date/Time   CHOL 111 03/10/2014 0350   HgbA1C  Lab Results  Component Value Date   HGBA1C 10.2* 11/30/2013    Urine Drug Screen:   No results found for this basename: labopia,  cocainscrnur,  labbenz,  amphetmu,  thcu,  labbarb    Alcohol Level: No results found for this basename: ETH,  in the last 168 hours  CT of the brain  03/08/2014   Extensive chronic ischemic changes as described. 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      2D Echocardiogram  EF 60-65% with no source of embolus. A mechanical aortic valve sits well in the aortic position. There is no aortic insufficiency and no paravalvular  leak.   Carotid Doppler    CXR  03/08/2014    Right central line tip at the cavoatrial junction.  No pneumothorax.  Mild hyperinflation.  No acute cardiopulmonary disease.    03/08/2014   No acute abnormalities.    03/08/2014  Previous AVR.  No active disease.  EKG  sinus tachycardia. For complete results please see formal report.   Therapy Recommendations CIR  Physical Exam   Patient just got extubated. Awake alert. Afebrile. Head is nontraumatic. Neck is supple without bruit. Hearing is normal. Cardiac exam no murmur or gallop. Lungs are clear to auscultation. Distal pulses are well felt. Neurological Exam : Awake alert oriented x2. Follows simple commands. Speech is slow but fluent. No dysarthria. Extraocular movements are full range without nystagmus. Blinks to threat bilaterally. Vision acuity seems adequate. Fundi were not visualized. Mild left lower facial weakness. Tongue midline. No upper or lower extremity drift. Mild weakness of left grip and intrinsic hand muscles. Next distant testing is symmetric. Gait was not tested   ASSESSMENT [name] is a 53 y.o. female presenting with acute onset encephalopathy and respiratory failure associated with DKA. CT done shows acute right frontal infarct and old right parietal, left parietal and left occipital infarcts. Infarcts felt to be embolic secondary to artificial valve, as pt on aspirin 81 mg orally every day and warfarin prior to admission; INR subtherapeutic at 1.37 on arrival. Now on warfarin and heparin for secondary stroke prevention.  Stroke work up underway.   VDRF, resolved Hypertension  86/54 on admission  Hyperlipidemia, LDL no drawn, on pravachol 20 mg ddaily PTA, now on no statin, goal LDL < 100 (< 70 for diabetics) Diabetes, HgbA1c pending, goal < 7.0 Aortic stenosis s/p valve replacement on coumadin PTA Hx stroke   CAD - CABG, NSTEMI on arrival, trop 0.32->0.43  Family hx stroke  RA  Hospital day #  2  TREATMENT/PLAN  Continue warfarin and heparin bridge for secondary stroke prevention. Heparin currently off due to INR at 2.14 - not at goal - need to resume heparin until goal 2.5-3.5 met   Cancel MRI, MRA  Resumed low dose aspirin given cardiac disease   Resumed statin   F/u Carotids  Rehab consult in place  Dr. Leonie Man discussed diagnosis, prognosis,  treatment options and plan of care with CCM care team.   Burnetta Sabin, MSN, RN, ANVP-BC, AGPCNP-BC [facility] Pager: [phone] 03/10/2014 9:37  AM  I have personally obtained a history, examined the patient, evaluated imaging results, and formulated the assessment and plan of care. I agree with the above.  Antony Contras, MD   To contact Stroke Continuity provider, please refer to [email or web address] After hours, contact General Neurology

## 2014-03-10 NOTE — Procedures (Signed)
Tolerated well Mcarthur Rossetti. Tyson Alias, MD, FACP Pgr: [phone] [facility]

## 2014-03-10 NOTE — Progress Notes (Addendum)
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 Per EMS report patient last seen normal last night. Her son called EMS because of AMS. The patient was found to be unresponsive to painful stimuli. CBG on BMP 1124 in ED. Lactic acid 9.81 and high AG with bicarbonate <7.0. PCCM called for admission. Patient is intubated in ED. Gastric fluid blood positive. CT-head showed ischemic stroke.   SIGNIFICANT EVENTS / STUDIES:   4/20 Intubated  4/20 CT Head Right frontal lobe infarct which has a subacute appearance  4/21 Extubated successfully. Heparin started with coumadin bridge 4/21 2D echo with EF 60-65%, grade 1 diastolic CHF  LINES / TUBES: ETT 4/20>>>4/21 CVL Left Ij 4/20 >>> NG tube 4/20>>>4/21  CULTURES: MRSA 4/20 >> NEG Urine 4/20 >>NG Blood  4/20>>  ANTIBIOTICS: 4/20 Vanco>>> 4/20 Unasyn>>>  SUBJECTIVE:   Overnight with low grade fever 100.47F. Pt reports feeling good. Breathing well with occasional dry cough. No new weakness, paraesthesias, headache, visions changes, or dysphagia. She feels her speech is slower than normal. She was able to ambulate with PT yesterday. No abdominal complaints, nausea, vomiting, or bloody stools.     VITAL SIGNS: Temp:  [99.5 F (37.5 C)-100.5 F (38.1 C)] 99.8 F (37.7 C) (04/22 0600) Pulse Rate:  [87-117] 90 (04/22 0700) Resp:  [10-36] 26 (04/22 0700) BP: (94-141)/(40-92) 94/52 mmHg (04/22 0600) SpO2:  [94 %-100 %] 100 % (04/22 0700) FiO2 (%):  [30 %] 30 % (04/21 0800) Weight:  [78.6 kg (173 lb  4.5 oz)] 78.6 kg (173 lb 4.5 oz) (04/22 0353) HEMODYNAMICS: CVP:  [6 mmHg-10 mmHg] 6 mmHg VENTILATOR SETTINGS: Vent Mode:  [-] CPAP;PSV FiO2 (%):  [30 %] 30 % PEEP:  [5 cmH20] 5 cmH20 Pressure Support:  [5 cmH20] 5 cmH20 INTAKE / OUTPUT: Intake/Output     04/21 0701 - 04/22 0700 04/22 0701 - 04/23 0700   P.O. 720    I.V. (mL/kg) 3243.6 (41.3)    IV Piggyback 1102    Total Intake(mL/kg) 5065.6 (64.4)    Urine (mL/kg/hr) 4650 (2.5)    Total Output 4650     Net +415.6            PHYSICAL EXAMINATION:  General: NAD, slow speech HEENT:  PEERL, EOMI  Pulm: CTAB   CV: Normal rhythm,  tachycardic Abd: Soft, non tender, normal BS  Ext: No leg edema Neuro: Mild left UE and LE weakness. Normal sensation. CN's intact Skin: no rash  LABS:  CBC  Recent Labs Lab 03/09/14 0415 03/09/14 1700 03/10/14 0350  WBC 15.9* 11.4* 9.3  HGB 10.3* 9.7* 9.3*  HCT 29.6* 27.6* 26.9*  PLT 203 146* 132*   Coag's  Recent Labs Lab 03/08/14 0831 03/09/14 0415 03/10/14 0350  APTT  --  37  --   INR 1.37 1.90* 2.14*   BMET  Recent Labs Lab 03/09/14 1600 03/09/14 2030 03/10/14 0350  NA 138 137 141  K 4.1 3.9 3.9  CL 110 111 111  CO2 17* 17* 20  BUN 11 9 9   CREATININE 0.85  0.87 0.82  GLUCOSE 251* 213* 246*   Electrolytes  Recent Labs Lab 03/09/14 0415  03/09/14 1600 03/09/14 1700 03/09/14 2030 03/10/14 0350  CALCIUM 7.8*  < > 7.7*  --  8.0* 7.8*  MG 1.5  --   --  2.2  --  2.0  PHOS 1.5*  --   --  2.3  --  2.5  < > = values in this interval not displayed. Sepsis Markers  Recent Labs Lab 03/08/14 1313 03/08/14 1900 03/09/14 0413 03/09/14 0415 03/10/14 0400  LATICACIDVEN 3.4* 3.1* 2.0  --   --   PROCALCITON 2.06  --   --  2.07 0.86   ABG  Recent Labs Lab 03/08/14 1202 03/08/14 1851  PHART 7.062* 7.418  PCO2ART 15.2* 22.6*  PO2ART 259.0* 244.0*   Liver Enzymes  Recent Labs Lab 03/08/14 0831  AST 16  ALT 15  ALKPHOS 133*  BILITOT <0.2*  ALBUMIN 3.7    Cardiac Enzymes  Recent Labs Lab 03/09/14 0957 03/09/14 2030 03/10/14 0350  TROPONINI 0.52* 0.31* <0.30   Glucose  Recent Labs Lab 03/09/14 1758 03/09/14 1911 03/09/14 1929 03/09/14 2035 03/09/14 2335 03/10/14 0344  GLUCAP 243* 174* 172* 204* 150* 226*    Imaging Ct Head Wo Contrast  03/08/2014   CLINICAL DATA:  Altered mental status  EXAM: CT HEAD WITHOUT CONTRAST  TECHNIQUE: Contiguous axial images were obtained from the base of the skull through the vertex without intravenous contrast.  COMPARISON:  None.  FINDINGS: Extensive chronic ischemic changes are present. There is severe encephalomalacia in the right parietal lobe. Extensive chronic ischemic changes in the periventricular white matter. Encephalomalacia from focal infarcts in the left occipital and parietal lobes is also present. There is low-density in the gray matter and adjacent white matter of the right frontal lobe with effacement of the sulci and intermediate Hounsfield unit measurements. This has a subacute appearance. No evidence of acute intracranial hemorrhage. No midline shift. Mild global atrophy.  IMPRESSION: Extensive chronic ischemic changes as described. 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   Electronically Signed   By: Maryclare Bean M.D.   On: 03/08/2014 18:04   Dg Chest Port 1 View  03/08/2014   CLINICAL DATA:  Central line placement.  EXAM: PORTABLE CHEST - 1 VIEW  COMPARISON:  03/08/2014  FINDINGS: Prior median sternotomy and valve replacement. Endotracheal tube remains approximately 2 cm above the carina. Right central line is in place with the tip at the cavoatrial junction. No pneumothorax.  Hyperinflation of the lungs. No confluent opacities. No effusions. No acute bony abnormality.  IMPRESSION: Right central line tip at the cavoatrial junction.  No pneumothorax.  Mild hyperinflation.  No acute cardiopulmonary disease.   Electronically Signed   By: Charlett Nose M.D.   On:  03/08/2014 13:49   Dg Chest Portable 1 View  03/08/2014   CLINICAL DATA:  Intubation, central line placement  EXAM: PORTABLE CHEST - 1 VIEW  COMPARISON:  Portable exam 1116 hr compared to 03/08/2014 at 0849 hr  FINDINGS: Tip of endotracheal tube projects 2.2 cm above carinal.  No central venous catheter identified.  Normal heart size post AVR.  Mediastinal contours and pulmonary vascularity normal.  Lungs clear.  No pleural effusion, pneumothorax or acute osseous findings.  IMPRESSION: No acute abnormalities.   Electronically Signed   By: Ulyses Southward M.D.   On: 03/08/2014 11:41   Dg Chest Portable 1 View  03/08/2014   CLINICAL DATA:  Altered mental status.  Hyperglycemia.  EXAM: PORTABLE CHEST - 1 VIEW  COMPARISON:  None.  FINDINGS: There has been previous median sternotomy and aortic valve replacement. Heart size is normal. The lungs are clear. The vascularity is normal. No effusions.  IMPRESSION: Previous AVR.  No active disease.   Electronically Signed   By: Paulina Fusi M.D.   On: 03/08/2014 09:04   Dg Abd Portable 1v  03/08/2014   CLINICAL DATA:  NG tube placement.  EXAM: PORTABLE ABDOMEN - 1 VIEW  COMPARISON:  None.  FINDINGS: NG tube coils in the fundus. The catheter then passes distally with the tip in the antrum of the stomach.  Calcified fibroid in the pelvis. Nonobstructive bowel gas pattern. No free air organomegaly.  IMPRESSION: NG tube coils in the fundus with the tip in the antrum.   Electronically Signed   By: Charlett Nose M.D.   On: 03/08/2014 13:53    ASSESSMENT / PLAN:  PULMONARY A: Acute respiratory failure requiring mechanical ventilation - resolved  - extubated successfully on 4/22, currently 100% O2  on RA P:   Monitor respiratory status  Oxygen supplementation to keep SpO2 > 92%  CARDIOVASCULAR A:   Hypovolemic shock   - 2D echo EF 60-65%, off pressor support Grade 1 Diastolic CHF   - per 2D-echo on 4/21 History of aortic valve replacement on coumadin with  subtherapeutic INR 1.37 on admission History of HTN Hyperlipidemia   - lipid panel wnl  Type II NSTEMI   - troponin now wnl P:   Hold home anti-hypertensives  Start simvastatin 10 mg daily D/c NS 150 mL/hr Continue IV heparin gtt with coumadin bridge (started 4/21)  Outpatient cardiology f/u  RENAL A:    AKI - resolved   Hyperkalemia - resolved Lactic acidosis - resolved P:   D/c NS 150 mL/hr  Monitor BMP  GASTROINTESTINAL A:    GERD on chronic PPI therapy  Positive gastric fluid blood   - Hg 9.3, on admission 12.1. Likely due to stress ulcer  P:    Dysphagia 3 diet  IV protonix 40 mg daily Monitor CBC  HEMATOLOGIC A:  Aortic valve replacement on chronic AC therapy P:   Follow CBC  Continue IV heparin with coumadin bridge for goal INR 2.5-3.5 (per neurology)   INFECTIOUS A:  Leukocytosis - resolved  - PCT 2.07 to 0.86 today No infectious foci P:    D/c empiric coverage  ENDOCRINE A:    DKA/ HONK - resolved Uncontrolled Insulin Dependant  Type II DM  - CBG 150-226.Last A1c 10.2 on 11/30/13 History of  RA not on chronic steroid P:   Monitor CBG   Moderate SSI  Levemir 10 U daily, consider increase to 20 U  Consider 5 U meal time coverage Obtain A1c  NEUROLOGIC A:   Acute encephalopathy in setting of DKA and CVA Subacute frontal lobe ischemic stroke likely due to subtherapeutic INR on coumadin - PT recommends CIR P:   Neurology following Frequent neuro checks F/u b/l doppler carotids Continue IV heparin with coumadin bridge Ramelteon at bedtime OT consult  CIR consult  Start 81 mg daily aspirin  Start simvastatin 10 mg daily  Code: Full  Disposition: To telemetry with Family Medicine Service to resume care on 4/23  Otis Brace, MD  PGY-1 IMTS    03/10/2014, 7:07 AM   Attending:  I have seen and examined the patient with nurse practitioner/resident and agree with the note above.   improved  Resume heparin as neurology desires INR  goal 2.5 to 3.5 MRI brain To floor  PCCM off as of 4/23  Heber Stanley, MD [facility] Pager: [phone] Cell: [phone] If no response, call [phone]

## 2014-03-11 DIAGNOSIS — I359 Nonrheumatic aortic valve disorder, unspecified: Secondary | ICD-10-CM

## 2014-03-11 DIAGNOSIS — I10 Essential (primary) hypertension: Secondary | ICD-10-CM

## 2014-03-11 DIAGNOSIS — E1049 Type 1 diabetes mellitus with other diabetic neurological complication: Secondary | ICD-10-CM

## 2014-03-11 DIAGNOSIS — Z7901 Long term (current) use of anticoagulants: Secondary | ICD-10-CM

## 2014-03-11 DIAGNOSIS — E119 Type 2 diabetes mellitus without complications: Secondary | ICD-10-CM

## 2014-03-11 LAB — BASIC METABOLIC PANEL
BUN: 11 mg/dL (ref 6–23)
CHLORIDE: 107 meq/L (ref 96–112)
CO2: 20 mEq/L (ref 19–32)
Calcium: 8.8 mg/dL (ref 8.4–10.5)
Creatinine, Ser: 0.74 mg/dL (ref 0.50–1.10)
GFR calc Af Amer: >90 mL/min (ref >90)
GFR calc non Af Amer: >90 mL/min (ref >90)
GLUCOSE: 342 mg/dL — AB (ref 70–99)
Potassium: 4.3 mEq/L (ref 3.7–5.3)
Sodium: 140 mEq/L (ref 137–147)

## 2014-03-11 LAB — GLUCOSE, CAPILLARY
GLUCOSE-CAPILLARY: 302 mg/dL — AB (ref 70–99)
GLUCOSE-CAPILLARY: 164 mg/dL — AB (ref 70–99)
Glucose-Capillary: 114 mg/dL — ABNORMAL HIGH (ref 70–99)
Glucose-Capillary: 357 mg/dL — ABNORMAL HIGH (ref 70–99)
Glucose-Capillary: 195 mg/dL — ABNORMAL HIGH (ref 70–99)
Glucose-Capillary: 202 mg/dL — ABNORMAL HIGH (ref 70–99)

## 2014-03-11 LAB — PROTIME-INR
INR: 1.44 (ref 0.00–1.49)
Prothrombin Time: 17.2 seconds — ABNORMAL HIGH (ref 11.6–15.2)

## 2014-03-11 LAB — CBC
HEMATOCRIT: 30.5 % — AB (ref 36.0–46.0)
HEMOGLOBIN: 10.2 g/dL — AB (ref 12.0–15.0)
MCH: 28.4 pg (ref 26.0–34.0)
MCHC: 33.4 g/dL (ref 30.0–36.0)
MCV: 85 fL (ref 78.0–100.0)
Platelets: 134 10*3/uL — ABNORMAL LOW (ref 150–400)
RBC: 3.59 MIL/uL — ABNORMAL LOW (ref 3.87–5.11)
RDW: 13.9 % (ref 11.5–15.5)
WBC: 6.7 10*3/uL (ref 4.0–10.5)

## 2014-03-11 LAB — PHOSPHORUS
PHOSPHORUS: 2.8 mg/dL (ref 2.3–4.6)
PHOSPHORUS: 2.6 mg/dL (ref 2.3–4.6)

## 2014-03-11 LAB — MAGNESIUM
Magnesium: 2 mg/dL (ref 1.5–2.5)
Magnesium: 1.8 mg/dL (ref 1.5–2.5)

## 2014-03-11 LAB — HEMOGLOBIN A1C
Hgb A1c MFr Bld: 11.7 % — ABNORMAL HIGH (ref <5.7)
Mean Plasma Glucose: 289 mg/dL — ABNORMAL HIGH (ref <117)

## 2014-03-11 LAB — HEPARIN LEVEL (UNFRACTIONATED): Heparin Unfractionated: 0.41 IU/mL (ref 0.30–0.70)

## 2014-03-11 MED ORDER — ESCITALOPRAM OXALATE 5 MG PO TABS
5.0000 mg | ORAL_TABLET | Freq: Every day | ORAL | Status: DC
Start: 2014-03-11 — End: 2014-03-12
  Administered 2014-03-11 – 2014-03-12 (×2): 5 mg via ORAL
  Filled 2014-03-11 (×2): qty 1

## 2014-03-11 MED ORDER — WARFARIN SODIUM 5 MG PO TABS
5.0000 mg | ORAL_TABLET | Freq: Once | ORAL | Status: AC
  Administered 2014-03-11: 5 mg via ORAL
  Filled 2014-03-11: qty 1

## 2014-03-11 MED ORDER — DARIFENACIN HYDROBROMIDE ER 7.5 MG PO TB24
7.5000 mg | ORAL_TABLET | Freq: Every day | ORAL | Status: DC
  Administered 2014-03-11 – 2014-03-12 (×2): 7.5 mg via ORAL
  Filled 2014-03-11 (×2): qty 1

## 2014-03-11 MED ORDER — BACLOFEN 10 MG PO TABS
10.0000 mg | ORAL_TABLET | Freq: Two times a day (BID) | ORAL | Status: DC
  Administered 2014-03-11 – 2014-03-12 (×2): 10 mg via ORAL
  Filled 2014-03-11 (×4): qty 1

## 2014-03-11 MED ORDER — PANTOPRAZOLE SODIUM 40 MG PO TBEC
40.0000 mg | DELAYED_RELEASE_TABLET | Freq: Every day | ORAL | Status: DC
Start: 2014-03-11 — End: 2014-03-12
  Administered 2014-03-11 – 2014-03-12 (×2): 40 mg via ORAL
  Filled 2014-03-11: qty 1

## 2014-03-11 MED ORDER — INSULIN ASPART PROT & ASPART (70-30 MIX) 100 UNIT/ML ~~LOC~~ SUSP
20.0000 [IU] | Freq: Two times a day (BID) | SUBCUTANEOUS | Status: DC
  Administered 2014-03-11 – 2014-03-12 (×3): 20 [IU] via SUBCUTANEOUS
  Filled 2014-03-11: qty 10

## 2014-03-11 MED ORDER — SODIUM CHLORIDE 0.9 % IJ SOLN: 10.0000 mL | INTRAMUSCULAR | Status: DC | PRN

## 2014-03-11 NOTE — Progress Notes (Signed)
[facility] PROGRESS NOTE  Madison [name] HWE:[id number] DOB: [birth year] DOA: 03/08/2014 PCP: Janell Quiet, FNP  Assessment/Plan: 1. CVA -CT scan of brain performed on 03/08/2014 showing right frontal lobe infarct having subacute appearance -Patient with history of aortic valve replacement, presenting with a subtherapeutic INR of 1.37. -Neurology consulted, it remains an IV heparin with Coumadin bridge. -Coumadin restarted on 03/09/2014, pharmacy consulted for Coumadin dosing  2. acute hypoxemic respiratory failure -Status post rapid sequence intubation, admitted to the pulmonary critical care service, extubated on 03/09/2014  3. Status post aortic valve replacement -Remains on IV heparin until Coumadin therapeutic -Pharmacy consulted for Coumadin dosing, INR goal 2.5-3.5  4. Diabetic ketoacidosis versus hyperosmolar hyperglycemic state -Resolved -Blood sugars suboptimally controlled however, continues to have blood sugars of 300's -Will change to insulin 70/30 20 units subcutaneous twice a day, titrate as needed -Per records patient had been on insulin 7030 40 units subcutaneous in a.m. -Continue Accu-Cheks q. a.c. and each bedtime with vascular coverage  5. Gastroesophageal reflux disease -Continue PPI therapy  6. Acute kidney injury -Likely secondary to hypotension/prerenal azotemia -IV fluids discontinue   Code Status: Full code Family Communication:  Disposition Plan: Patient would likely benefit from CIR   Consultants:  PCCM  Neurology  Procedures: ETT 4/20>>>4/21  CVL Left Ij 4/20 >>>  NG tube 4/20>>>4/21   Antibiotics:   HPI/Subjective: Patient is a pleasant 53 year old female with a past medical history of aortic valve replacement with mechanical valve, presented with mental status changes in setting of hypoglycemia, DKA, imaging studies revealing an acute/subacute anterior frontal CVA. Patient was not as treated with TPA. Admitted to the  pulmonary critical care service where she underwent rapid sequence intubation. She was successfully extubated on 03/09/2014. Transferred to telemetry on 03/10/2014.  Objective: Filed Vitals:   03/11/14 0500  BP: 124/65  Pulse: 76  Temp: 98.8 F (37.1 C)  Resp: 18    Intake/Output Summary (Last 24 hours) at 03/11/14 1333 Last data filed at 03/10/14 2334  Gross per 24 hour  Intake    240 ml  Output    277 ml  Net    -37 ml   Filed Weights   03/09/14 0437 03/10/14 0353 03/11/14 0500  Weight: 78.7 kg (173 lb 8 oz) 78.6 kg (173 lb 4.5 oz) 75.705 kg (166 lb 14.4 oz)    Exam:   General:  Patient is in no acute distress awake and alert  Cardiovascular: Regular rate rhythm normal S1-S2  Respiratory: Clear to auscultation bilaterally  Abdomen: Soft nontender nondistended  Musculoskeletal: No edema  Neurological: 3/5 left upper extremity and left lower extremity weakness, no facial droop or slurred speech  Data Reviewed: Basic Metabolic Panel:  Recent Labs Lab 03/09/14 0415  03/09/14 1200 03/09/14 1600 03/09/14 1700 03/09/14 2030 03/10/14 0350 03/10/14 1700 03/11/14 0428  NA 142  < > 140 138  --  137 141  --  140  K 3.2*  < > 4.0 4.1  --  3.9 3.9  --  4.3  CL 111  < > 110 110  --  111 111  --  107  CO2 14*  < > 17* 17*  --  17* 20  --  20  GLUCOSE 180*  < > 163* 251*  --  213* 246*  --  342*  BUN 21  < > 14 11  --  9 9  --  11  CREATININE 1.07  < > 0.95 0.85  --  0.87 0.82  --  0.74  CALCIUM 7.8*  < > 7.7* 7.7*  --  8.0* 7.8*  --  8.8  MG 1.5  --   --   --  2.2  --  2.0 1.9 2.0  PHOS 1.5*  --   --   --  2.3  --  2.5 1.4* 2.8  < > = values in this interval not displayed. Liver Function Tests:  Recent Labs Lab 03/08/14 0831  AST 16  ALT 15  ALKPHOS 133*  BILITOT <0.2*  PROT 7.5  ALBUMIN 3.7   No results found for this basename: LIPASE, AMYLASE,  in the last 168 hours No results found for this basename: AMMONIA,  in the last 168 hours CBC:  Recent  Labs Lab 03/09/14 0004 03/09/14 0415 03/09/14 1700 03/10/14 0350 03/11/14 0925  WBC 17.2* 15.9* 11.4* 9.3 6.7  HGB 10.4* 10.3* 9.7* 9.3* 10.2*  HCT 29.6* 29.6* 27.6* 26.9* 30.5*  MCV 82.2 81.3 83.4 83.0 85.0  PLT 212 203 146* 132* 134*   Cardiac Enzymes:  Recent Labs Lab 03/08/14 1646 03/08/14 1900 03/08/14 2210 03/09/14 0416 03/09/14 0957 03/09/14 2030 03/10/14 0350  CKTOTAL  --  155  --   --   --   --   --   TROPONINI <0.30  --  0.32* 0.43* 0.52* 0.31* <0.30   BNP (last 3 results)  Recent Labs  10/23/13 1601  PROBNP 130.0*   CBG:  Recent Labs Lab 03/10/14 1330 03/10/14 2037 03/11/14 0447 03/11/14 0648 03/11/14 1128  GLUCAP 368* 114* 302* 357* 195*    Recent Results (from the past 240 hour(s))  CULTURE, BLOOD (ROUTINE X 2)     Status: None   Collection Time    03/08/14  8:31 AM      Result Value Ref Range Status   Specimen Description BLOOD RIGHT ANTECUBITAL   Final   Special Requests BOTTLES DRAWN AEROBIC AND ANAEROBIC 5CCS   Final   Culture  Setup Time     Final   Value: 03/08/2014 13:30     Performed at [facility]   Culture     Final   Value:        BLOOD CULTURE RECEIVED NO GROWTH TO DATE CULTURE WILL BE HELD FOR 5 DAYS BEFORE ISSUING A FINAL NEGATIVE REPORT     Performed at [facility]   Report Status PENDING   Incomplete  CULTURE, BLOOD (ROUTINE X 2)     Status: None   Collection Time    03/08/14  9:30 AM      Result Value Ref Range Status   Specimen Description BLOOD LEFT ANTECUBITAL   Final   Special Requests BOTTLES DRAWN AEROBIC AND ANAEROBIC 10CCS   Final   Culture  Setup Time     Final   Value: 03/08/2014 13:31     Performed at [facility]   Culture     Final   Value:        BLOOD CULTURE RECEIVED NO GROWTH TO DATE CULTURE WILL BE HELD FOR 5 DAYS BEFORE ISSUING A FINAL NEGATIVE REPORT     Performed at [facility]   Report Status PENDING   Incomplete  MRSA PCR SCREENING     Status: None    Collection Time    03/08/14 12:49 PM      Result Value Ref Range Status   MRSA by PCR NEGATIVE  NEGATIVE Final   Comment:  The GeneXpert MRSA Assay (FDA     approved for NASAL specimens     only), is one component of a     comprehensive MRSA colonization     surveillance program. It is not     intended to diagnose MRSA     infection nor to guide or     monitor treatment for     MRSA infections.  URINE CULTURE     Status: None   Collection Time    03/08/14  1:20 PM      Result Value Ref Range Status   Specimen Description URINE, CATHETERIZED   Final   Special Requests Normal   Final   Culture  Setup Time     Final   Value: 03/08/2014 17:16     Performed at [facility] Count     Final   Value: NO GROWTH     Performed at [facility]   Culture     Final   Value: NO GROWTH     Performed at [facility]   Report Status 03/09/2014 FINAL   Final     Studies: No results found.  Scheduled Meds: . aspirin  81 mg Oral Daily  . baclofen  10 mg Oral BID  . insulin aspart  0-15 Units Subcutaneous TID WC  . insulin aspart protamine- aspart  20 Units Subcutaneous BID WC  . pantoprazole  40 mg Oral Q1200  . ramelteon  8 mg Oral QHS  . simvastatin  10 mg Oral q1800  . sodium chloride  3 mL Intravenous Q12H  . warfarin  5 mg Oral ONCE-1800  . Warfarin - Pharmacist Dosing Inpatient   Does not apply q1800   Continuous Infusions: . heparin 800 Units/hr (03/10/14 1201)    Principal Problem:   Septic shock(785.52) Active Problems:   Type I (juvenile type) diabetes mellitus with neurological manifestations, not stated as uncontrolled   DKA (diabetic ketoacidoses)   Severe sepsis(995.92)   Metabolic acidosis    Time spent: 35 min    Jeralyn Bennett  [facility] Pager [phone]. If 7PM-7AM, please contact night-coverage at [email or web address], password [facility] 03/11/2014, 1:33 PM  LOS: 3 days

## 2014-03-11 NOTE — Progress Notes (Signed)
ANTICOAGULATION CONSULT NOTE - Follow Up Consult  Pharmacy Consult:  Heparin / Coumadin Indication:  AVR  Allergies  Allergen Reactions  . Celebrex [Celecoxib] Rash  . Detrol [Tolterodine]     Patient Measurements: Height: 5\' 8"  (172.7 cm) Weight: 166 lb 14.4 oz (75.705 kg) IBW/kg (Calculated) : 63.9 Heparin Dosing Weight: 79 kg  Vital Signs: Temp: 98.8 F (37.1 C) (04/23 0500) Temp src: Oral (04/23 0500) BP: 124/65 mmHg (04/23 0500) Pulse Rate: 76 (04/23 0500)  Labs:  Recent Labs  03/08/14 1646 03/08/14 1900  03/09/14 0004 03/09/14 0415  03/09/14 0957  03/09/14 1700 03/09/14 2030 03/10/14 0350 03/10/14 1912 03/11/14 0428  HGB  --  10.3*  --  10.4* 10.3*  --   --   --  9.7*  --  9.3*  --   --   HCT  --  30.4*  --  29.6* 29.6*  --   --   --  27.6*  --  26.9*  --   --   PLT  --  253  --  212 203  --   --   --  146*  --  132*  --   --   APTT  --   --   --   --  37  --   --   --   --   --   --   --   --   LABPROT  --   --   --   --  21.2*  --   --   --   --   --  23.2*  --  17.2*  INR  --   --   --   --  1.90*  --   --   --   --   --  2.14*  --  1.44  HEPARINUNFRC  --   --   --   --   --   --   --   < > 0.73*  --  0.63 0.49 0.41  CREATININE 1.38* 1.18*  < > 1.08 1.07  < >  --   < >  --  0.87 0.82  --  0.74  CKTOTAL  --  155  --   --   --   --   --   --   --   --   --   --   --   TROPONINI <0.30  --   < >  --   --   < > 0.52*  --   --  0.31* <0.30  --   --   < > = values in this interval not displayed.  Estimated Creatinine Clearance: 82 ml/min (by C-G formula based on Cr of 0.74).     Assessment: Madison [name] s/p CVA and NSTEMI to continue on Coumadin and IV heparin bridge for history of AVR.  Heparin level therapeutic.  Coumadin was not charted as given on 03/10/14 and INR decreased significantly today.  Per record, patient was on Coumadin 5mg  PO daily PTA.  No bleeding reported.   Goal of Therapy:  Heparin level: 0.3-0.5 units/hr INR 2.5-3.5 per Neuro Monitor  platelets by anticoagulation protocol: Yes    Plan:  - Coumadin 5mg  PO today - Continue heparin gtt at 800 units/hr - Daily HL / CBC / PT / INR    Tanyah Debruyne D. , PharmD, BCPS Pager:  [phone] 03/11/2014, 8:33 AM

## 2014-03-11 NOTE — Progress Notes (Signed)
Physical Therapy Treatment Patient Details Name: Madison [name] MRN: [id number] DOB: [birth year] Today's Date: 03/11/2014    History of Present Illness pt presents with DKA, Encephalopathy, Anterior Frontal Ischemic Infarct, with hx of R MCA Infarct.      PT Comments    Pt admitted with above. Pt currently with functional limitations due to balance and endurance deficits.  Pt states that she takes care of 53 year old son at home.  He has ADHD therefore pt needs to be independent.  Sister is taking care of son at present and can help pt if needed at d/c.  Pt recently got approved for an aide/RN at home as well.  Feel that inpatient Rehab is a good plan for pt and then d/c home with the intermittent assist she had PTA.  Pt in agreement.    Spoke with Genie, rehab CM about above.  Pt will benefit from skilled PT to increase their independence and safety with mobility to allow discharge to the venue listed below.   Follow Up Recommendations  CIR     Equipment Recommendations  Other (comment) (TBA)    Recommendations for Other Services Rehab consult OT consult    Precautions / Restrictions Precautions Precautions: Fall Restrictions Weight Bearing Restrictions: No    Mobility  Bed Mobility Overal bed mobility: Needs Assistance Bed Mobility: Supine to Sit;Sit to Supine     Supine to sit: Min guard;HOB elevated        Transfers Overall transfer level: Needs assistance Equipment used: 1 person hand held assist Transfers: Sit to/from Stand Sit to Stand: Min guard         General transfer comment: cues for UE use.    Ambulation/Gait Ambulation/Gait assistance: Min guard;Min assist Ambulation Distance (Feet): 350 Feet Assistive device: 1 person hand held assist;None Gait Pattern/deviations: Step-to pattern;Decreased stride length;Decreased step length - left;Drifts right/left   Gait velocity interpretation: Below normal speed for age/gender General Gait Details: Pt drifts left  when ambulating and occasionally bumped into wall and objects on left.  Pt did not lose balance and was not using a device during large portion of ambulation. When challenged, pt does lose balance and needs asssist to maintain stability.  Upon further questioning, pt has to care for 21 year old son.  Will need to be fully independent as he has ADHD and she assists himwith bathing and brushing his teeth prior to him catching bus in the ams.  Recently had aide start assisting pt in the home per pt.     Stairs            Wheelchair Mobility    Modified Rankin (Stroke Patients Only) Modified Rankin (Stroke Patients Only) Pre-Morbid Rankin Score: Slight disability Modified Rankin: Moderately severe disability     Balance Overall balance assessment: Needs assistance;History of Falls Sitting-balance support: No upper extremity supported;Feet supported Sitting balance-Leahy Scale: Good     Standing balance support: Single extremity supported;No upper extremity supported;During functional activity Standing balance-Leahy Scale: Fair Standing balance comment: Pt can stand statically without UE support however dynamically if challenged pt can need min to mod assist.               High level balance activites: Direction changes;Turns;Sudden stops High Level Balance Comments: Needs min to mod assist with greater challenges.  Has to be independnent at home on d/c and appears to possibly have a left field deficit as she is running into objects on her left.  Cognition Arousal/Alertness: Awake/alert Behavior During Therapy: WFL for tasks assessed/performed Overall Cognitive Status: Impaired/Different from baseline Area of Impairment: Attention;Memory;Awareness;Problem solving   Current Attention Level: Selective Memory: Decreased short-term memory     Awareness: Emergent Problem Solving: Slow processing;Difficulty sequencing;Requires verbal cues;Requires tactile cues       Exercises General Exercises - Lower Extremity Ankle Circles/Pumps: AROM;Both;10 reps;Seated Long Arc Quad: AROM;Both;10 reps;Seated Hip Flexion/Marching: AROM;Both;10 reps;Seated    General Comments        Pertinent Vitals/Pain VSS, No pain    Home Living Family/patient expects to be discharged to:: Inpatient rehab Living Arrangements: Other (Comment) (Has a 16 year old son - sister is taking care of son currentl) Available Help at Discharge: Personal care attendant (Aide up to 67 hours/month per pt so 3 hours day M-Sun) Type of Home: House Home Access: Level entry   Home Layout: One level Home Equipment: Cane - single point Additional Comments: sister can come and stay with pt.  Son with ADHD and pt has to care for him and get him on the bus.  Sister supportive per pt.    Prior Function            PT Goals (current goals can now be found in the care plan section) Progress towards PT goals: Progressing toward goals    Frequency  Min 4X/week    PT Plan Current plan remains appropriate    Co-evaluation             End of Session Equipment Utilized During Treatment: Gait belt Activity Tolerance: Patient limited by fatigue Patient left: in chair;with call bell/phone within reach     Time: 1011-1036 PT Time Calculation (min): 25 min  Charges:  $Gait Training: 8-22 mins $Therapeutic Exercise: 8-22 mins                    G Codes:      Lowell Mcgurk Annie Paras 03/31/14, 11:06 AM [facility] [phone] (pager)

## 2014-03-11 NOTE — Progress Notes (Signed)
Inpatient Diabetes Program Recommendations  AACE/ADA: New Consensus Statement on Inpatient Glycemic Control (2013)  Target Ranges:  Prepandial:   less than 140 mg/dL      Peak postprandial:   less than 180 mg/dL (1-2 hours)      Critically ill patients:  140 - 180 mg/dL   Reason for Assessment:  Spoke to patient at length regarding diabetes management.  She has had Type 1 diabetes since age 53.  States that last Saturday (03-06-14) she had a low CBG of 22 mg/dL.  EMS had to be called.  On 4/19 she states that she took her insulin and went to church however when she went home her CBG was elevated and she began vomiting.  She states that she does not remember much of the rest of the day and woke up in the hospital. Patient states that she checks her blood sugars 5-6 times a day.  Her A1C usually ranges in the 8-9% range.  She states that her medications were changed around 2-3 months ago.  Previously she was on Lantus/Novolog regimen.  Her A1C=10.2% in 11/30/13 however now up to 12.0% 03/10/14.  She was taking Novolog 70/30 40 units q AM (once daily) and Novolog correction tid with meals.     Agree with splitting 70/30 dose to 20 units bid (may need to reduce dose? Based on CBG levels). Spoke to patient at length regarding her diabetes management.  She states that she has had hypoglycemia frequently since her insulin regimen was changed.  Needs close follow-up and would likely benefit from outpatient follow-up with diabetes educator.  Will order per protocol.  Will follow.  Thanks, Beryl Meager, RN, BC-ADM Inpatient Diabetes Coordinator Pager [phone]

## 2014-03-11 NOTE — Progress Notes (Signed)
Stroke Team Progress Note  HISTORY Madison [name] is an 53 y.o. female history of diabetes mellitus, hypertension, hyperlipidemia, previous stroke, DVT and aortic valve replacement, subtherapeutic on Coumadin, who was admitted 03/08/2014 for acute respiratory failure and DKA. Patient was intubated in the emergency room. Initial blood sugar was 1124. Patient was found unresponsive by family members. She was last seen well on the evening of 03/07/2014. 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 INR on admission was 1.37. Patient was not administered TPA secondary to delay in arrival. She was admitted to the ICU for further evaluation and treatment.  SUBJECTIVE No family at bedside. Patient states she has been walking with therapists. She plans on rehab at discharge.  OBJECTIVE Most recent Vital Signs: Filed Vitals:   03/10/14 1324 03/10/14 1433 03/10/14 2209 03/11/14 0500  BP: 120/78 123/77 118/49 124/65  Pulse: 83 78 81 76  Temp: 98.7 F (37.1 C) 98.8 F (37.1 C) 99 F (37.2 C) 98.8 F (37.1 C)  TempSrc: Oral Oral Oral Oral  Resp: _0 Height:      Weight:    75.705 kg (166 lb 14.4 oz)  SpO2: 99% 99% 100% 99%   CBG (last 3)   Recent Labs  03/10/14 2037 03/11/14 0447 03/11/14 0648  GLUCAP 114* 302* 357*    IV Fluid Intake:   . heparin 800 Units/hr (03/10/14 1201)    MEDICATIONS  . aspirin  81 mg Oral Daily  . insulin aspart  0-15 Units Subcutaneous TID WC  . insulin detemir  10 Units Subcutaneous Daily  . pantoprazole  40 mg Oral Q1200  . ramelteon  8 mg Oral QHS  . simvastatin  10 mg Oral q1800  . sodium chloride  3 mL Intravenous Q12H  . warfarin  5 mg Oral ONCE-1800  . Warfarin - Pharmacist Dosing Inpatient   Does not apply q1800   PRN:  dextrose  Diet:  Carb Control thin liquids Activity:  Bedrest, up in chair, progress activity DVT Prophylaxis:  Heparin 5000 units sq  tid   CLINICALLY SIGNIFICANT STUDIES Basic Metabolic Panel:   Recent Labs Lab 03/10/14 0350 03/10/14 1700 03/11/14 0428  NA 141  --  140  K 3.9  --  4.3  CL 111  --  107  CO2 20  --  20  GLUCOSE 246*  --  342*  BUN 9  --  11  CREATININE 0.82  --  0.74  CALCIUM 7.8*  --  8.8  MG 2.0 1.9 2.0  PHOS 2.5 1.4* 2.8   Liver Function Tests:   Recent Labs Lab 03/08/14 0831  AST 16  ALT 15  ALKPHOS 133*  BILITOT <0.2*  PROT 7.5  ALBUMIN 3.7   CBC:   Recent Labs Lab 03/09/14 1700 03/10/14 0350  WBC 11.4* 9.3  HGB 9.7* 9.3*  HCT 27.6* 26.9*  MCV 83.4 83.0  PLT 146* 132*   Coagulation:   Recent Labs Lab 03/08/14 0831 03/09/14 0415 03/10/14 0350 03/11/14 0428  LABPROT 16.5* 21.2* 23.2* 17.2*  INR 1.37 1.90* 2.14* 1.44   Cardiac Enzymes:   Recent Labs Lab 03/08/14 1646 03/08/14 1900  03/09/14 0957 03/09/14 2030 03/10/14 0350  CKTOTAL  --  155  --   --   --   --   TROPONINI <0.30  --   < > 0.52* 0.31* <0.30  < > = values in  this interval not displayed. Urinalysis:   Recent Labs Lab 03/08/14 0840 03/08/14 1320  COLORURINE YELLOW YELLOW  LABSPEC 1.027 1.025  PHURINE 5.0 5.0  GLUCOSEU >1000* >1000*  HGBUR NEGATIVE MODERATE*  BILIRUBINUR NEGATIVE MODERATE*  KETONESUR 40* 40*  PROTEINUR NEGATIVE 30*  UROBILINOGEN 0.2 1.0  NITRITE NEGATIVE NEGATIVE  LEUKOCYTESUR NEGATIVE NEGATIVE   Lipid Panel     Component Value Date/Time   CHOL 111 03/10/2014 0350   TRIG 90 03/10/2014 0350   HDL 43 03/10/2014 0350   CHOLHDL 2.6 03/10/2014 0350   VLDL 18 03/10/2014 0350   LDLCALC 50 03/10/2014 0350   HgbA1C  Lab Results  Component Value Date   HGBA1C 12.0* 03/10/2014   Urine Drug Screen:   No results found for this basename: labopia,  cocainscrnur,  labbenz,  amphetmu,  thcu,  labbarb    Alcohol Level: No results found for this basename: ETH,  in the last 168 hours  CT of the brain  03/08/2014   Extensive chronic ischemic changes as described. There is a  right frontal lobe infarct which has a subacute appearance.     2D Echocardiogram  EF 60-65% with no source of embolus. A mechanical aortic valve sits well in the aortic position. There is no aortic insufficiency and no paravalvular leak.   Carotid Doppler  Right; Unable to image more than the CCA due to central line/dressings. Left: 1-39% ICA stenosis. Vertebral antegrade.  CXR  03/08/2014    Right central line tip at the cavoatrial junction.  No pneumothorax.  Mild hyperinflation.  No acute cardiopulmonary disease.    03/08/2014   No acute abnormalities.    03/08/2014  Previous AVR.  No active disease.  EKG  sinus tachycardia. For complete results please see formal report.   Therapy Recommendations CIR  Physical Exam   Patient just got extubated. Awake alert. Afebrile. Head is nontraumatic. Neck is supple without bruit. Hearing is normal. Cardiac exam no murmur or gallop. Lungs are clear to auscultation. Distal pulses are well felt. Neurological Exam : Awake alert oriented x2. Follows simple commands. Speech is slow but fluent. No dysarthria. Extraocular movements are full range without nystagmus. Blinks to threat bilaterally. Vision acuity seems adequate. Fundi were not visualized. Mild left lower facial weakness. Tongue midline. No upper or lower extremity drift. Mild weakness of left grip and intrinsic hand muscles. Next distant testing is symmetric. Gait was not tested   ASSESSMENT [name] is a 53 y.o. female presenting with acute onset encephalopathy and respiratory failure associated with DKA. CT done shows acute right frontal infarct and old right parietal, left parietal and left occipital infarcts. Infarcts felt to be embolic secondary to artificial valve, as pt on aspirin 81 mg orally every day and warfarin prior to admission; INR subtherapeutic at 1.37 on arrival. Now on warfarin and heparin for secondary stroke prevention.  Stroke work up completed.   VDRF,  resolved Hypertension  86/54 on admission  Hyperlipidemia, LDL 50, on pravachol 20 mg ddaily PTA, now on zocor 10 mg daily, at goal LDL < 70 for diabetics Diabetes, HgbA1c 12.0, goal < 7.0 Aortic stenosis s/p valve replacement on coumadin PTA Hx stroke   CAD - CABG, NSTEMI on arrival, trop 0.32->0.43  Family hx stroke  RA  Hospital day # 3  TREATMENT/PLAN  Continue warfarin and heparin bridge for secondary stroke prevention. Heparin currently off due to INR at 2.14 - not at goal - need to resume heparin until goal 2.5-3.5 met  Continue low dose aspirin given cardiac disease   Dispo:  IP Rehab   No further stroke workup indicated.  Patient has a 10-15% risk of having another stroke over the next year, the highest risk is within 2 weeks of the most recent stroke/TIA (risk of having a stroke following a stroke or TIA is the same).  Ongoing risk factor control by Primary Care Physician  Stroke Service will sign off. Please call should any needs arise.  Follow up with Dr. Leonie Man, [facility], in 2 months.  Dr. Leonie Man discussed diagnosis, prognosis,  treatment options and plan of care with CCM care team.   Burnetta Sabin, MSN, RN, ANVP-BC, AGPCNP-BC [facility] Pager: [phone] 03/11/2014 9:30 AM  I have personally obtained a history, examined the patient, evaluated imaging results, and formulated the assessment and plan of care. I agree with the above. Antony Contras, MD   To contact Stroke Continuity provider, please refer to [email or web address] After hours, contact General Neurology

## 2014-03-12 ENCOUNTER — Inpatient Hospital Stay (HOSPITAL_COMMUNITY)
Admission: RE | Admit: 2014-03-12 | Discharge: 2014-03-22 | DRG: 945 | Disposition: A | Discharge status: Home Health | Payer: Medicare HMO | Source: Intra-hospital | Attending: Physical Medicine & Rehabilitation | Admitting: Physical Medicine & Rehabilitation

## 2014-03-12 DIAGNOSIS — I639 Cerebral infarction, unspecified: Secondary | ICD-10-CM | POA: Diagnosis present

## 2014-03-12 DIAGNOSIS — Z5189 Encounter for other specified aftercare: Principal | ICD-10-CM

## 2014-03-12 DIAGNOSIS — G4700 Insomnia, unspecified: Secondary | ICD-10-CM | POA: Diagnosis not present

## 2014-03-12 DIAGNOSIS — Z7982 Long term (current) use of aspirin: Secondary | ICD-10-CM | POA: Diagnosis not present

## 2014-03-12 DIAGNOSIS — I2510 Atherosclerotic heart disease of native coronary artery without angina pectoris: Secondary | ICD-10-CM

## 2014-03-12 DIAGNOSIS — E785 Hyperlipidemia, unspecified: Secondary | ICD-10-CM

## 2014-03-12 DIAGNOSIS — E1142 Type 2 diabetes mellitus with diabetic polyneuropathy: Secondary | ICD-10-CM

## 2014-03-12 DIAGNOSIS — I10 Essential (primary) hypertension: Secondary | ICD-10-CM | POA: Diagnosis not present

## 2014-03-12 DIAGNOSIS — Z7901 Long term (current) use of anticoagulants: Secondary | ICD-10-CM

## 2014-03-12 DIAGNOSIS — Z954 Presence of other heart-valve replacement: Secondary | ICD-10-CM

## 2014-03-12 DIAGNOSIS — N179 Acute kidney failure, unspecified: Secondary | ICD-10-CM | POA: Diagnosis present

## 2014-03-12 DIAGNOSIS — I6340 Cerebral infarction due to embolism of unspecified cerebral artery: Secondary | ICD-10-CM

## 2014-03-12 DIAGNOSIS — J9600 Acute respiratory failure, unspecified whether with hypoxia or hypercapnia: Secondary | ICD-10-CM | POA: Diagnosis present

## 2014-03-12 DIAGNOSIS — Z8673 Personal history of transient ischemic attack (TIA), and cerebral infarction without residual deficits: Secondary | ICD-10-CM

## 2014-03-12 DIAGNOSIS — Z823 Family history of stroke: Secondary | ICD-10-CM

## 2014-03-12 DIAGNOSIS — Z951 Presence of aortocoronary bypass graft: Secondary | ICD-10-CM | POA: Diagnosis not present

## 2014-03-12 DIAGNOSIS — E1149 Type 2 diabetes mellitus with other diabetic neurological complication: Secondary | ICD-10-CM

## 2014-03-12 DIAGNOSIS — I6350 Cerebral infarction due to unspecified occlusion or stenosis of unspecified cerebral artery: Secondary | ICD-10-CM | POA: Diagnosis not present

## 2014-03-12 DIAGNOSIS — Z79899 Other long term (current) drug therapy: Secondary | ICD-10-CM | POA: Diagnosis not present

## 2014-03-12 DIAGNOSIS — Z794 Long term (current) use of insulin: Secondary | ICD-10-CM | POA: Diagnosis not present

## 2014-03-12 DIAGNOSIS — M069 Rheumatoid arthritis, unspecified: Secondary | ICD-10-CM | POA: Diagnosis not present

## 2014-03-12 DIAGNOSIS — E669 Obesity, unspecified: Secondary | ICD-10-CM

## 2014-03-12 LAB — CBC
HCT: 30.1 % — ABNORMAL LOW (ref 36.0–46.0)
HEMOGLOBIN: 10 g/dL — AB (ref 12.0–15.0)
MCH: 28.6 pg (ref 26.0–34.0)
MCHC: 33.2 g/dL (ref 30.0–36.0)
MCV: 86 fL (ref 78.0–100.0)
PLATELETS: 122 10*3/uL — AB (ref 150–400)
RBC: 3.5 MIL/uL — ABNORMAL LOW (ref 3.87–5.11)
RDW: 13.9 % (ref 11.5–15.5)
WBC: 4.6 10*3/uL (ref 4.0–10.5)

## 2014-03-12 LAB — PROTIME-INR
INR: 1.32 (ref 0.00–1.49)
PROTHROMBIN TIME: 16.1 s — AB (ref 11.6–15.2)

## 2014-03-12 LAB — HEPARIN LEVEL (UNFRACTIONATED)
HEPARIN UNFRACTIONATED: 0.18 [IU]/mL — AB (ref 0.30–0.70)
HEPARIN UNFRACTIONATED: 0.14 [IU]/mL — AB (ref 0.30–0.70)

## 2014-03-12 LAB — MAGNESIUM: MAGNESIUM: 1.8 mg/dL (ref 1.5–2.5)

## 2014-03-12 LAB — PHOSPHORUS: PHOSPHORUS: 2.9 mg/dL (ref 2.3–4.6)

## 2014-03-12 LAB — GLUCOSE, CAPILLARY
GLUCOSE-CAPILLARY: 282 mg/dL — AB (ref 70–99)
GLUCOSE-CAPILLARY: 72 mg/dL (ref 70–99)
GLUCOSE-CAPILLARY: 285 mg/dL — AB (ref 70–99)
Glucose-Capillary: 226 mg/dL — ABNORMAL HIGH (ref 70–99)

## 2014-03-12 MED ORDER — SORBITOL 70 % SOLN: 30.0000 mL | Freq: Every day | Status: DC | PRN

## 2014-03-12 MED ORDER — PANTOPRAZOLE SODIUM 40 MG PO TBEC
40.0000 mg | DELAYED_RELEASE_TABLET | Freq: Every day | ORAL | Status: DC
  Administered 2014-03-13 – 2014-03-21 (×9): 40 mg via ORAL
  Filled 2014-03-12 (×9): qty 1

## 2014-03-12 MED ORDER — WARFARIN SODIUM 7.5 MG PO TABS
7.5000 mg | ORAL_TABLET | Freq: Once | ORAL | Status: DC
  Filled 2014-03-12: qty 1

## 2014-03-12 MED ORDER — BACLOFEN 10 MG PO TABS
10.0000 mg | ORAL_TABLET | Freq: Two times a day (BID) | ORAL | Status: DC
  Administered 2014-03-12 – 2014-03-22 (×20): 10 mg via ORAL
  Filled 2014-03-12 (×22): qty 1

## 2014-03-12 MED ORDER — INSULIN ASPART PROT & ASPART (70-30 MIX) 100 UNIT/ML ~~LOC~~ SUSP
20.0000 [IU] | Freq: Two times a day (BID) | SUBCUTANEOUS | Status: DC
  Administered 2014-03-12 – 2014-03-22 (×20): 20 [IU] via SUBCUTANEOUS
  Filled 2014-03-12 (×2): qty 10

## 2014-03-12 MED ORDER — RAMELTEON 8 MG PO TABS: 8.0000 mg | ORAL_TABLET | Freq: Every day | ORAL | Status: DC

## 2014-03-12 MED ORDER — HEPARIN (PORCINE) IN NACL 100-0.45 UNIT/ML-% IJ SOLN: 900.0000 [IU]/h | INTRAMUSCULAR | Status: DC

## 2014-03-12 MED ORDER — DARIFENACIN HYDROBROMIDE ER 7.5 MG PO TB24
7.5000 mg | ORAL_TABLET | Freq: Every day | ORAL | Status: DC
  Administered 2014-03-13 – 2014-03-22 (×10): 7.5 mg via ORAL
  Filled 2014-03-12 (×11): qty 1

## 2014-03-12 MED ORDER — ASPIRIN 81 MG PO CHEW
81.0000 mg | CHEWABLE_TABLET | Freq: Every day | ORAL | Status: DC
  Administered 2014-03-13 – 2014-03-22 (×10): 81 mg via ORAL
  Filled 2014-03-12 (×10): qty 1

## 2014-03-12 MED ORDER — SIMVASTATIN 10 MG PO TABS
10.0000 mg | ORAL_TABLET | Freq: Every day | ORAL | Status: DC
  Administered 2014-03-12 – 2014-03-21 (×10): 10 mg via ORAL
  Filled 2014-03-12 (×11): qty 1

## 2014-03-12 MED ORDER — HEPARIN (PORCINE) IN NACL 100-0.45 UNIT/ML-% IJ SOLN: 1050.0000 [IU]/h | INTRAMUSCULAR | Status: DC

## 2014-03-12 MED ORDER — ESCITALOPRAM OXALATE 5 MG PO TABS
5.0000 mg | ORAL_TABLET | Freq: Every day | ORAL | Status: DC
  Administered 2014-03-13 – 2014-03-22 (×10): 5 mg via ORAL
  Filled 2014-03-12 (×11): qty 1

## 2014-03-12 MED ORDER — WARFARIN SODIUM 7.5 MG PO TABS: 7.5000 mg | ORAL_TABLET | Freq: Once | ORAL | Status: DC

## 2014-03-12 MED ORDER — WARFARIN SODIUM 7.5 MG PO TABS
7.5000 mg | ORAL_TABLET | Freq: Once | ORAL | Status: AC
  Administered 2014-03-12: 7.5 mg via ORAL
  Filled 2014-03-12: qty 1

## 2014-03-12 MED ORDER — ACETAMINOPHEN 325 MG PO TABS
325.0000 mg | ORAL_TABLET | ORAL | Status: DC | PRN
  Administered 2014-03-18 – 2014-03-19 (×2): 650 mg via ORAL
  Filled 2014-03-12 (×2): qty 2

## 2014-03-12 MED ORDER — ONDANSETRON HCL 4 MG/2ML IJ SOLN: 4.0000 mg | Freq: Four times a day (QID) | INTRAMUSCULAR | Status: DC | PRN

## 2014-03-12 MED ORDER — INSULIN ASPART 100 UNIT/ML ~~LOC~~ SOLN
0.0000 [IU] | Freq: Three times a day (TID) | SUBCUTANEOUS | Status: DC
  Administered 2014-03-12: 8 [IU] via SUBCUTANEOUS
  Administered 2014-03-13 (×2): 3 [IU] via SUBCUTANEOUS
  Administered 2014-03-14 – 2014-03-15 (×3): 5 [IU] via SUBCUTANEOUS
  Administered 2014-03-15: 3 [IU] via SUBCUTANEOUS
  Administered 2014-03-16 (×2): 5 [IU] via SUBCUTANEOUS
  Administered 2014-03-16: 3 [IU] via SUBCUTANEOUS
  Administered 2014-03-17: 5 [IU] via SUBCUTANEOUS
  Administered 2014-03-17: 3 [IU] via SUBCUTANEOUS
  Administered 2014-03-17: 2 [IU] via SUBCUTANEOUS
  Administered 2014-03-18: 3 [IU] via SUBCUTANEOUS
  Administered 2014-03-18: 11 [IU] via SUBCUTANEOUS
  Administered 2014-03-19: 5 [IU] via SUBCUTANEOUS
  Administered 2014-03-19: 8 [IU] via SUBCUTANEOUS
  Administered 2014-03-19 – 2014-03-20 (×2): 2 [IU] via SUBCUTANEOUS
  Administered 2014-03-20: 8 [IU] via SUBCUTANEOUS
  Administered 2014-03-20: 2 [IU] via SUBCUTANEOUS
  Administered 2014-03-21: 5 [IU] via SUBCUTANEOUS
  Administered 2014-03-21: 11 [IU] via SUBCUTANEOUS
  Administered 2014-03-22: 3 [IU] via SUBCUTANEOUS

## 2014-03-12 MED ORDER — RAMELTEON 8 MG PO TABS
8.0000 mg | ORAL_TABLET | Freq: Every day | ORAL | Status: DC
  Administered 2014-03-12 – 2014-03-21 (×10): 8 mg via ORAL
  Filled 2014-03-12 (×11): qty 1

## 2014-03-12 MED ORDER — INSULIN ASPART PROT & ASPART (70-30 MIX) 100 UNIT/ML ~~LOC~~ SUSP: 20.0000 [IU] | Freq: Two times a day (BID) | SUBCUTANEOUS | Status: DC

## 2014-03-12 MED ORDER — WARFARIN - PHARMACIST DOSING INPATIENT: Freq: Every day | Status: DC

## 2014-03-12 MED ORDER — PANTOPRAZOLE SODIUM 40 MG PO TBEC: 40.0000 mg | DELAYED_RELEASE_TABLET | Freq: Every day | ORAL | Status: DC

## 2014-03-12 MED ORDER — ONDANSETRON HCL 4 MG PO TABS: 4.0000 mg | ORAL_TABLET | Freq: Four times a day (QID) | ORAL | Status: DC | PRN

## 2014-03-12 MED ORDER — INSULIN ASPART 100 UNIT/ML ~~LOC~~ SOLN: 0.0000 [IU] | Freq: Three times a day (TID) | SUBCUTANEOUS | Status: DC

## 2014-03-12 MED ORDER — HEPARIN (PORCINE) IN NACL 100-0.45 UNIT/ML-% IJ SOLN
1100.0000 [IU]/h | INTRAMUSCULAR | Status: DC
  Administered 2014-03-13: 1050 [IU]/h via INTRAVENOUS
  Administered 2014-03-14 – 2014-03-16 (×3): 1100 [IU]/h via INTRAVENOUS
  Filled 2014-03-12 (×4): qty 250

## 2014-03-12 NOTE — Progress Notes (Signed)
ANTICOAGULATION CONSULT NOTE - Follow Up Consult  Pharmacy Consult for heparin Indication: AVR  Labs:  Recent Labs  03/09/14 0957  03/09/14 2030 03/10/14 0350 03/10/14 1912 03/11/14 0428 03/11/14 0925 03/12/14 0503  HGB  --   < >  --  9.3*  --   --  10.2* 10.0*  HCT  --   < >  --  26.9*  --   --  30.5* 30.1*  PLT  --   < >  --  132*  --   --  134* 122*  LABPROT  --   --   --  23.2*  --  17.2*  --  16.1*  INR  --   --   --  2.14*  --  1.44  --  1.32  HEPARINUNFRC  --   < >  --  0.63 0.49 0.41  --  0.18*  CREATININE  --   < > 0.87 0.82  --  0.74  --   --   TROPONINI 0.52*  --  0.31* <0.30  --   --   --   --   < > = values in this interval not displayed.   Assessment: 53yo female now subtherapeutic on heparin after several levels at goal, no gtt interruptions overnight.  Goal of Therapy:  Heparin level 0.3-0.5 units/ml   Plan:  Will increase heparin gtt slightly to 900 units/hr and check level in 6hr.  Vernard Gambles, PharmD, BCPS  03/12/2014,6:37 AM

## 2014-03-12 NOTE — H&P (Signed)
Physical Medicine and Rehabilitation Admission H&P  Chief Complaint   Patient presents with   .  Altered Mental Status   .  Hyperglycemia   :  HPI: Madison [name] is a 53 y.o. right-handed female with history of diabetes mellitus, hypertension, CAD with CABG as well as aortic valve replacement on chronic Coumadin. Patient was independent prior to admission with ADLs and mobility. Admitted 03/08/2014 with altered mental status, hypoglycemia and acute respiratory failure. Patient was intubated in the emergency room. Noted blood sugar of 1124 as well as potassium 7.2. 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 INR on admission of 1.37. Echocardiogram and carotid Dopplers are pending. Critical medicine consulted for severe DKA metabolic acidosis hyperkalemia. Placed on intravenous fluids. Patient was extubated 03/09/2014. Neurology services consulted patient presently remains on Coumadin therapy with heparin bridge for secondary stroke prevention. Maintained on a dysphagia 3 thin liquid diet. Physical therapy evaluation completed 03/09/2014 with recommendations for physical medicine rehabilitation consult. Patient was admitted for comprehensive rehabilitation program  ROS Review of Systems  Gastrointestinal: Positive for constipation.  Musculoskeletal: Positive for joint pain and myalgias.  Psychiatric-insomnia  All other systems reviewed and are negative  Past Medical History   Diagnosis  Date   .  CAD (coronary artery disease)    .  Obesity    .  Hypercholesteremia    .  HTN (hypertension)    .  Dyslipidemia    .  Stroke syndrome    .  Aortic stenosis    .  Thrombophlebitis    .  Diabetes    .  Hyperlipidemia    .  Rheumatoid arthritis(714.0)     Past Surgical History   Procedure  Laterality  Date   .  Cesarean section     .  Foot fracture surgery     .  Knee surgery     .  Neuroplasty / transposition median nerve  at carpal tunnel      Family History   Problem  Relation  Age of Onset   .  Stroke      Social History: reports that she has never smoked. She does not have any smokeless tobacco history on file. She reports that she does not drink alcohol or use illicit drugs.  Allergies:  Allergies   Allergen  Reactions   .  Celebrex [Celecoxib]  Rash   .  Detrol [Tolterodine]     Medications Prior to Admission   Medication  Sig  Dispense  Refill   .  aspirin 81 MG tablet  Take 81 mg by mouth daily.     .  baclofen (LIORESAL) 10 MG tablet  Take 1 tablet (10 mg total) by mouth every morning.  90 each  0   .  Doxepin HCl (SILENOR) 6 MG TABS  Take 6 mg by mouth at bedtime.     Marland Kitchen  escitalopram (LEXAPRO) 5 MG tablet  Take 5 mg by mouth daily.     .  Insulin Aspart Prot & Aspart (NOVOLOG MIX 70/30 FLEXPEN) (70-30) 100 UNIT/ML Pen  Inject 40 Units into the skin daily with breakfast. And pen needles 1/day  15 pen  3   .  omeprazole (PRILOSEC) 40 MG capsule  Take 1 capsule (40 mg total) by mouth daily.  90 capsule  3   .  pravastatin (PRAVACHOL) 20 MG tablet  Take 1 tablet (20 mg total) by mouth daily.  90 tablet  0   .  solifenacin (VESICARE) 5 MG tablet  Take 5 mg by mouth daily.     Marland Kitchen  warfarin (COUMADIN) 5 MG tablet  Take 5 mg by mouth daily.      Home:  Home Living  Family/patient expects to be discharged to:: Inpatient rehab  Living Arrangements: Children  Functional History:  Prior Function  Level of Independence: Independent  Functional Status:  Mobility:  Bed Mobility  Overal bed mobility: Needs Assistance  Bed Mobility: Supine to Sit;Sit to Supine  Supine to sit: Min assist;HOB elevated  Sit to supine: Min assist  General bed mobility comments: in chair  Transfers  Overall transfer level: Needs assistance  Equipment used: 1 person hand held assist  Transfers: Sit to/from Stand  Sit to Stand: Min guard  General transfer comment: cues for UE use.  Ambulation/Gait  Ambulation/Gait  assistance: Min guard;Min assist  Ambulation Distance (Feet): 350 Feet  Assistive device: None;1 person hand held assist  Gait Pattern/deviations: Step-to pattern;Decreased stride length;Decreased step length - left;Drifts right/left  Gait velocity interpretation: Below normal speed for age/gender  General Gait Details: Pt drifts left when ambulating and occasionally bumped into wall and objects on left. Pt did not lose balance and was not using a device during large portion of ambulation. Pt states she is at baseline.   ADL:   Cognition:  Cognition  Overall Cognitive Status: Impaired/Different from baseline  Orientation Level: Oriented to person;Oriented to place;Oriented to situation  Cognition  Arousal/Alertness: Awake/alert  Behavior During Therapy: WFL for tasks assessed/performed  Overall Cognitive Status: Impaired/Different from baseline  Area of Impairment: Attention;Memory;Awareness;Problem solving  Current Attention Level: Selective  Memory: Decreased short-term memory  Awareness: Emergent  Problem Solving: Slow processing;Difficulty sequencing;Requires verbal cues;Requires tactile cues    Physical Exam:  Blood pressure 109/48, pulse 93, temperature 98.5 F (36.9 C), temperature source Oral, resp. rate 28, height '5\' 8"'  (1.727 m), weight 173 lb 4.5 oz (78.6 kg), SpO2 99.00%.   HENT: oral mucosa pink and moist Head: Normocephalic.  Eyes: EOM are normal.  Neck: Normal range of motion. Neck supple. No thyromegaly present.  Cardiovascular: Normal rate and regular rhythm. No murmur Respiratory: Effort normal and breath sounds normal. No respiratory distress. No wheezes GI: Soft. Bowel sounds are normal. She exhibits no distension.  Neurological: She is alert.  Mood is flat but appropriate. She does make good eye contact with examiner. She was able to provide her name age date of birth. Fair insight and awareness. Left HH.   LUE 2 to 2+ deltoid, bicep, tricep, wrist, and HI.  LLE 3+ HF, 4 KE, 4 ADF/APF. Sensation slightly decreased to PP and LT LUE and LLE. Marland Kitchen Resting tone 2/4 LUE and 1-2/4 LLE. DTR's 3+ LUE and LLE. Babinski present LLE Skin: Skin is warm and dry.  Psych: pt is pleasant and cooperative  Results for orders placed during the hospital encounter of 03/08/14 (from the past 48 hour(s))   MRSA PCR SCREENING Status: None    Collection Time    03/08/14 12:49 PM   Result  Value  Ref Range    MRSA by PCR  NEGATIVE  NEGATIVE    Comment:      The GeneXpert MRSA Assay (FDA     approved for NASAL specimens     only), is one component of a     comprehensive MRSA colonization     surveillance program. It is not     intended to  diagnose MRSA     infection nor to guide or     monitor treatment for     MRSA infections.   GLUCOSE, CAPILLARY Status: Abnormal    Collection Time    03/08/14 1:01 PM   Result  Value  Ref Range    Glucose-Capillary  570 (*)  70 - 99 mg/dL    Comment 1  Documented in Chart     Comment 2  Orig Pt Id entered as [id number]     Comment 3  Edited per [id number]    LACTIC ACID, PLASMA Status: Abnormal    Collection Time    03/08/14 1:13 PM   Result  Value  Ref Range    Lactic Acid, Venous  3.4 (*)  0.5 - 2.2 mmol/L   TROPONIN I Status: None    Collection Time    03/08/14 1:13 PM   Result  Value  Ref Range    Troponin I  <0.30  <0.30 ng/mL    Comment:      Due to the release kinetics of cTnI,     a negative result within the first hours     of the onset of symptoms does not rule out     myocardial infarction with certainty.     If myocardial infarction is still suspected,     repeat the test at appropriate intervals.   PROCALCITONIN Status: None    Collection Time    03/08/14 1:13 PM   Result  Value  Ref Range    Procalcitonin  2.06     Comment:      Interpretation:     PCT > 2 ng/mL:     Systemic infection (sepsis) is likely,     unless other causes are known.     (NOTE)     ICU PCT Algorithm Non ICU PCT Algorithm      ---------------------------- ------------------------------     PCT < 0.25 ng/mL PCT < 0.1 ng/mL     Stopping of antibiotics Stopping of antibiotics     strongly encouraged. strongly encouraged.     ---------------------------- ------------------------------     PCT level decrease by PCT < 0.25 ng/mL     >= 80% from peak PCT     OR PCT 0.25 - 0.5 ng/mL Stopping of antibiotics     encouraged.     Stopping of antibiotics     encouraged.     ---------------------------- ------------------------------     PCT level decrease by PCT >= 0.25 ng/mL     < 80% from peak PCT     AND PCT >= 0.5 ng/mL Continuing antibiotics     encouraged.     Continuing antibiotics     encouraged.     ---------------------------- ------------------------------     PCT level increase compared PCT > 0.5 ng/mL     with peak PCT AND     PCT >= 0.5 ng/mL Escalation of antibiotics     strongly encouraged.     Escalation of antibiotics     strongly encouraged.   CORTISOL-AM, BLOOD Status: Abnormal    Collection Time    03/08/14 1:13 PM   Result  Value  Ref Range    Cortisol - AM  25.2 (*)  4.3 - 22.4 ug/dL    Comment:  Performed at [facility] Status: Abnormal    Collection Time    03/08/14 1:13 PM   Result  Value  Ref Range  Sodium  142  137 - 147 mEq/L    Comment:  DELTA CHECK NOTED    Potassium  4.0  3.7 - 5.3 mEq/L    Comment:  DELTA CHECK NOTED    Chloride  109  96 - 112 mEq/L    CO2  8 (*)  19 - 32 mEq/L    Comment:  CRITICAL RESULT CALLED TO, READ BACK BY AND VERIFIED WITH:     MCKEOWN     MCKEOWN C RN 03/08/14 1400 COSTELLO B    Glucose, Bld  541 (*)  70 - 99 mg/dL    BUN  32 (*)  6 - 23 mg/dL    Creatinine, Ser  1.56 (*)  0.50 - 1.10 mg/dL    Calcium  7.6 (*)  8.4 - 10.5 mg/dL    GFR calc non Af Amer  37 (*)  >90 mL/min    GFR calc Af Amer  43 (*)  >90 mL/min    Comment:  (NOTE)     The eGFR has been calculated using the CKD EPI equation.     This  calculation has not been validated in all clinical situations.     eGFR's persistently <90 mL/min signify possible Chronic Kidney     Disease.   GLUCOSE, CAPILLARY Status: Abnormal    Collection Time    03/08/14 1:13 PM   Result  Value  Ref Range    Glucose-Capillary  531 (*)  70 - 99 mg/dL    Comment 1  Documented in Chart    URINE CULTURE Status: None    Collection Time    03/08/14 1:20 PM   Result  Value  Ref Range    Specimen Description  URINE, CATHETERIZED     Special Requests  Normal     Culture Setup Time      Value:  03/08/2014 17:16     Performed at [facility] Count      Value:  NO GROWTH     Performed at [facility]    Culture      Value:  NO GROWTH     Performed at [facility]    Report Status  03/09/2014 FINAL    URINALYSIS, ROUTINE W REFLEX MICROSCOPIC Status: Abnormal    Collection Time    03/08/14 1:20 PM   Result  Value  Ref Range    Color, Urine  YELLOW  YELLOW    APPearance  CLOUDY (*)  CLEAR    Specific Gravity, Urine  1.025  1.005 - 1.030    pH  5.0  5.0 - 8.0    Glucose, UA  >1000 (*)  NEGATIVE mg/dL    Hgb urine dipstick  MODERATE (*)  NEGATIVE    Bilirubin Urine  MODERATE (*)  NEGATIVE    Ketones, ur  40 (*)  NEGATIVE mg/dL    Protein, ur  30 (*)  NEGATIVE mg/dL    Urobilinogen, UA  1.0  0.0 - 1.0 mg/dL    Nitrite  NEGATIVE  NEGATIVE    Leukocytes, UA  NEGATIVE  NEGATIVE   URINE MICROSCOPIC-ADD ON Status: Abnormal    Collection Time    03/08/14 1:20 PM   Result  Value  Ref Range    Squamous Epithelial / LPF  RARE  RARE    WBC, UA  0-2  <3 WBC/hpf    RBC / HPF  3-6  <3 RBC/hpf    Bacteria, UA  FEW (*)  RARE    Casts  GRANULAR CAST (*)  NEGATIVE   GLUCOSE, CAPILLARY Status: Abnormal    Collection Time    03/08/14 2:16 PM   Result  Value  Ref Range    Glucose-Capillary  500 (*)  70 - 99 mg/dL   GLUCOSE, CAPILLARY Status: Abnormal    Collection Time    03/08/14 3:26 PM   Result  Value  Ref Range     Glucose-Capillary  366 (*)  70 - 99 mg/dL   BASIC METABOLIC PANEL Status: Abnormal    Collection Time    03/08/14 3:27 PM   Result  Value  Ref Range    Sodium  144  137 - 147 mEq/L    Potassium  3.5 (*)  3.7 - 5.3 mEq/L    Chloride  112  96 - 112 mEq/L    CO2  11 (*)  19 - 32 mEq/L    Glucose, Bld  393 (*)  70 - 99 mg/dL    BUN  31 (*)  6 - 23 mg/dL    Creatinine, Ser  1.43 (*)  0.50 - 1.10 mg/dL    Calcium  8.0 (*)  8.4 - 10.5 mg/dL    GFR calc non Af Amer  41 (*)  >90 mL/min    GFR calc Af Amer  47 (*)  >90 mL/min    Comment:  (NOTE)     The eGFR has been calculated using the CKD EPI equation.     This calculation has not been validated in all clinical situations.     eGFR's persistently <90 mL/min signify possible Chronic Kidney     Disease.   CARBOXYHEMOGLOBIN Status: Abnormal    Collection Time    03/08/14 3:30 PM   Result  Value  Ref Range    Total hemoglobin  10.4 (*)  12.0 - 16.0 g/dL    O2 Saturation  87.0     Carboxyhemoglobin  1.6 (*)  0.5 - 1.5 %    Methemoglobin  1.0  0.0 - 1.5 %   GLUCOSE, CAPILLARY Status: Abnormal    Collection Time    03/08/14 4:38 PM   Result  Value  Ref Range    Glucose-Capillary  311 (*)  70 - 99 mg/dL   TROPONIN I Status: None    Collection Time    03/08/14 4:46 PM   Result  Value  Ref Range    Troponin I  <0.30  <0.30 ng/mL    Comment:      Due to the release kinetics of cTnI,     a negative result within the first hours     of the onset of symptoms does not rule out     myocardial infarction with certainty.     If myocardial infarction is still suspected,     repeat the test at appropriate intervals.   BASIC METABOLIC PANEL Status: Abnormal    Collection Time    03/08/14 4:46 PM   Result  Value  Ref Range    Sodium  146  137 - 147 mEq/L    Potassium  3.4 (*)  3.7 - 5.3 mEq/L    Chloride  113 (*)  96 - 112 mEq/L    CO2  12 (*)  19 - 32 mEq/L    Glucose, Bld  304 (*)  70 - 99 mg/dL    BUN  30 (*)  6 - 23 mg/dL    Creatinine,  Ser  1.38 (*)  0.50 - 1.10 mg/dL    Calcium  8.2 (*)  8.4 - 10.5 mg/dL    GFR calc non Af Amer  43 (*)  >90 mL/min    GFR calc Af Amer  50 (*)  >90 mL/min    Comment:  (NOTE)     The eGFR has been calculated using the CKD EPI equation.     This calculation has not been validated in all clinical situations.     eGFR's persistently <90 mL/min signify possible Chronic Kidney     Disease.   MAGNESIUM Status: None    Collection Time    03/08/14 4:46 PM   Result  Value  Ref Range    Magnesium  2.0  1.5 - 2.5 mg/dL   PHOSPHORUS Status: Abnormal    Collection Time    03/08/14 4:46 PM   Result  Value  Ref Range    Phosphorus  1.0 (*)  2.3 - 4.6 mg/dL    Comment:  CRITICAL RESULT CALLED TO, READ BACK BY AND VERIFIED WITH:     A SHYSKO,RN 03/08/14 1849 SHIPMAN M   GLUCOSE, CAPILLARY Status: Abnormal    Collection Time    03/08/14 6:06 PM   Result  Value  Ref Range    Glucose-Capillary  193 (*)  70 - 99 mg/dL   POCT I-STAT 3, ART BLOOD GAS (G3+) Status: Abnormal    Collection Time    03/08/14 6:51 PM   Result  Value  Ref Range    pH, Arterial  7.418  7.350 - 7.450    pCO2 arterial  22.6 (*)  35.0 - 45.0 mmHg    pO2, Arterial  244.0 (*)  80.0 - 100.0 mmHg    Bicarbonate  14.4 (*)  20.0 - 24.0 mEq/L    TCO2  15  0 - 100 mmol/L    O2 Saturation  100.0     Acid-base deficit  8.0 (*)  0.0 - 2.0 mmol/L    Patient temperature  100.2 F     Collection site  RADIAL, ALLEN'S TEST ACCEPTABLE     Drawn by  Operator     Sample type  ARTERIAL    CBC Status: Abnormal    Collection Time    03/08/14 7:00 PM   Result  Value  Ref Range    WBC  20.5 (*)  4.0 - 10.5 K/uL    RBC  3.67 (*)  3.87 - 5.11 MIL/uL    Hemoglobin  10.3 (*)  12.0 - 15.0 g/dL    HCT  30.4 (*)  36.0 - 46.0 %    MCV  82.8  78.0 - 100.0 fL    Comment:  REPEATED TO VERIFY     DELTA CHECK NOTED    MCH  28.1  26.0 - 34.0 pg    MCHC  33.9  30.0 - 36.0 g/dL    RDW  13.1  11.5 - 15.5 %    Platelets  253  150 - 400 K/uL    Comment:   REPEATED TO VERIFY     DELTA CHECK NOTED   CK Status: None    Collection Time    03/08/14 7:00 PM   Result  Value  Ref Range    Total CK  155  7 - 177 U/L   LACTIC ACID, PLASMA Status: Abnormal    Collection Time    03/08/14 7:00 PM   Result  Value  Ref Range  Lactic Acid, Venous  3.1 (*)  0.5 - 2.2 mmol/L   BASIC METABOLIC PANEL Status: Abnormal    Collection Time    03/08/14 7:00 PM   Result  Value  Ref Range    Sodium  146  137 - 147 mEq/L    Potassium  3.2 (*)  3.7 - 5.3 mEq/L    Chloride  116 (*)  96 - 112 mEq/L    CO2  14 (*)  19 - 32 mEq/L    Glucose, Bld  158 (*)  70 - 99 mg/dL    BUN  27 (*)  6 - 23 mg/dL    Creatinine, Ser  1.18 (*)  0.50 - 1.10 mg/dL    Calcium  8.0 (*)  8.4 - 10.5 mg/dL    GFR calc non Af Amer  52 (*)  >90 mL/min    GFR calc Af Amer  60 (*)  >90 mL/min    Comment:  (NOTE)     The eGFR has been calculated using the CKD EPI equation.     This calculation has not been validated in all clinical situations.     eGFR's persistently <90 mL/min signify possible Chronic Kidney     Disease.   GLUCOSE, CAPILLARY Status: Abnormal    Collection Time    03/08/14 7:09 PM   Result  Value  Ref Range    Glucose-Capillary  165 (*)  70 - 99 mg/dL   OCCULT BLOOD GASTRIC / DUODENUM (SPECIMEN CUP) Status: Abnormal    Collection Time    03/08/14 7:10 PM   Result  Value  Ref Range    pH, Gastric  NOT DONE     Occult Blood, Gastric  POSITIVE (*)  NEGATIVE   GLUCOSE, CAPILLARY Status: Abnormal    Collection Time    03/08/14 8:20 PM   Result  Value  Ref Range    Glucose-Capillary  118 (*)  70 - 99 mg/dL   GLUCOSE, CAPILLARY Status: Abnormal    Collection Time    03/08/14 9:27 PM   Result  Value  Ref Range    Glucose-Capillary  118 (*)  70 - 99 mg/dL   TROPONIN I Status: Abnormal    Collection Time    03/08/14 10:10 PM   Result  Value  Ref Range    Troponin I  0.32 (*)  <0.30 ng/mL    Comment:      Due to the release kinetics of cTnI,     a negative  result within the first hours     of the onset of symptoms does not rule out     myocardial infarction with certainty.     If myocardial infarction is still suspected,     repeat the test at appropriate intervals.     CRITICAL RESULT CALLED TO, READ BACK BY AND VERIFIED WITHLavon Paganini RN [id number] GREEN R   BASIC METABOLIC PANEL Status: Abnormal    Collection Time    03/08/14 10:10 PM   Result  Value  Ref Range    Sodium  143  137 - 147 mEq/L    Potassium  3.9  3.7 - 5.3 mEq/L    Comment:  DELTA CHECK NOTED    Chloride  114 (*)  96 - 112 mEq/L    CO2  14 (*)  19 - 32 mEq/L    Glucose, Bld  150 (*)  70 - 99 mg/dL    BUN  25 (*)  6 - 23 mg/dL    Creatinine, Ser  1.11 (*)  0.50 - 1.10 mg/dL    Calcium  8.0 (*)  8.4 - 10.5 mg/dL    GFR calc non Af Amer  56 (*)  >90 mL/min    GFR calc Af Amer  65 (*)  >90 mL/min    Comment:  (NOTE)     The eGFR has been calculated using the CKD EPI equation.     This calculation has not been validated in all clinical situations.     eGFR's persistently <90 mL/min signify possible Chronic Kidney     Disease.   GLUCOSE, CAPILLARY Status: Abnormal    Collection Time    03/08/14 10:13 PM   Result  Value  Ref Range    Glucose-Capillary  145 (*)  70 - 99 mg/dL   GLUCOSE, CAPILLARY Status: Abnormal    Collection Time    03/08/14 11:09 PM   Result  Value  Ref Range    Glucose-Capillary  126 (*)  70 - 99 mg/dL   BASIC METABOLIC PANEL Status: Abnormal    Collection Time    03/09/14 12:04 AM   Result  Value  Ref Range    Sodium  141  137 - 147 mEq/L    Potassium  4.4  3.7 - 5.3 mEq/L    Chloride  113 (*)  96 - 112 mEq/L    CO2  14 (*)  19 - 32 mEq/L    Glucose, Bld  179 (*)  70 - 99 mg/dL    BUN  24 (*)  6 - 23 mg/dL    Creatinine, Ser  1.08  0.50 - 1.10 mg/dL    Calcium  7.8 (*)  8.4 - 10.5 mg/dL    GFR calc non Af Amer  58 (*)  >90 mL/min    GFR calc Af Amer  67 (*)  >90 mL/min    Comment:  (NOTE)     The eGFR has been calculated using  the CKD EPI equation.     This calculation has not been validated in all clinical situations.     eGFR's persistently <90 mL/min signify possible Chronic Kidney     Disease.   CBC Status: Abnormal    Collection Time    03/09/14 12:04 AM   Result  Value  Ref Range    WBC  17.2 (*)  4.0 - 10.5 K/uL    RBC  3.60 (*)  3.87 - 5.11 MIL/uL    Hemoglobin  10.4 (*)  12.0 - 15.0 g/dL    HCT  29.6 (*)  36.0 - 46.0 %    MCV  82.2  78.0 - 100.0 fL    MCH  28.9  26.0 - 34.0 pg    MCHC  35.1  30.0 - 36.0 g/dL    RDW  13.0  11.5 - 15.5 %    Platelets  212  150 - 400 K/uL   GLUCOSE, CAPILLARY Status: Abnormal    Collection Time    03/09/14 12:16 AM   Result  Value  Ref Range    Glucose-Capillary  178 (*)  70 - 99 mg/dL   GLUCOSE, CAPILLARY Status: Abnormal    Collection Time    03/09/14 1:10 AM   Result  Value  Ref Range    Glucose-Capillary  145 (*)  70 - 99 mg/dL   GLUCOSE, CAPILLARY Status: Abnormal    Collection Time    03/09/14  2:04 AM   Result  Value  Ref Range    Glucose-Capillary  170 (*)  70 - 99 mg/dL   GLUCOSE, CAPILLARY Status: Abnormal    Collection Time    03/09/14 3:07 AM   Result  Value  Ref Range    Glucose-Capillary  189 (*)  70 - 99 mg/dL   LACTIC ACID, PLASMA Status: None    Collection Time    03/09/14 4:13 AM   Result  Value  Ref Range    Lactic Acid, Venous  2.0  0.5 - 2.2 mmol/L   PROTIME-INR Status: Abnormal    Collection Time    03/09/14 4:15 AM   Result  Value  Ref Range    Prothrombin Time  21.2 (*)  11.6 - 15.2 seconds    INR  1.90 (*)  0.00 - 1.49   APTT Status: None    Collection Time    03/09/14 4:15 AM   Result  Value  Ref Range    aPTT  37  24 - 37 seconds    Comment:      IF BASELINE aPTT IS ELEVATED,     SUGGEST PATIENT RISK ASSESSMENT     BE USED TO DETERMINE APPROPRIATE     ANTICOAGULANT THERAPY.   CBC Status: Abnormal    Collection Time    03/09/14 4:15 AM   Result  Value  Ref Range    WBC  15.9 (*)  4.0 - 10.5 K/uL    RBC  3.64 (*)   3.87 - 5.11 MIL/uL    Hemoglobin  10.3 (*)  12.0 - 15.0 g/dL    HCT  29.6 (*)  36.0 - 46.0 %    MCV  81.3  78.0 - 100.0 fL    MCH  28.3  26.0 - 34.0 pg    MCHC  34.8  30.0 - 36.0 g/dL    RDW  13.0  11.5 - 15.5 %    Platelets  203  150 - 400 K/uL   PROCALCITONIN Status: None    Collection Time    03/09/14 4:15 AM   Result  Value  Ref Range    Procalcitonin  2.07     Comment:      Interpretation:     PCT > 2 ng/mL:     Systemic infection (sepsis) is likely,     unless other causes are known.     (NOTE)     ICU PCT Algorithm Non ICU PCT Algorithm     ---------------------------- ------------------------------     PCT < 0.25 ng/mL PCT < 0.1 ng/mL     Stopping of antibiotics Stopping of antibiotics     strongly encouraged. strongly encouraged.     ---------------------------- ------------------------------     PCT level decrease by PCT < 0.25 ng/mL     >= 80% from peak PCT     OR PCT 0.25 - 0.5 ng/mL Stopping of antibiotics     encouraged.     Stopping of antibiotics     encouraged.     ---------------------------- ------------------------------     PCT level decrease by PCT >= 0.25 ng/mL     < 80% from peak PCT     AND PCT >= 0.5 ng/mL Continuing antibiotics     encouraged.     Continuing antibiotics     encouraged.     ---------------------------- ------------------------------     PCT level increase compared PCT > 0.5 ng/mL     with peak  PCT AND     PCT >= 0.5 ng/mL Escalation of antibiotics     strongly encouraged.     Escalation of antibiotics     strongly encouraged.   BASIC METABOLIC PANEL Status: Abnormal    Collection Time    03/09/14 4:15 AM   Result  Value  Ref Range    Sodium  142  137 - 147 mEq/L    Potassium  3.2 (*)  3.7 - 5.3 mEq/L    Comment:  DELTA CHECK NOTED    Chloride  111  96 - 112 mEq/L    CO2  14 (*)  19 - 32 mEq/L    Glucose, Bld  180 (*)  70 - 99 mg/dL    BUN  21  6 - 23 mg/dL    Creatinine, Ser  1.07  0.50 - 1.10 mg/dL    Calcium  7.8  (*)  8.4 - 10.5 mg/dL    GFR calc non Af Amer  58 (*)  >90 mL/min    GFR calc Af Amer  67 (*)  >90 mL/min    Comment:  (NOTE)     The eGFR has been calculated using the CKD EPI equation.     This calculation has not been validated in all clinical situations.     eGFR's persistently <90 mL/min signify possible Chronic Kidney     Disease.   MAGNESIUM Status: None    Collection Time    03/09/14 4:15 AM   Result  Value  Ref Range    Magnesium  1.5  1.5 - 2.5 mg/dL   PHOSPHORUS Status: Abnormal    Collection Time    03/09/14 4:15 AM   Result  Value  Ref Range    Phosphorus  1.5 (*)  2.3 - 4.6 mg/dL   TROPONIN I Status: Abnormal    Collection Time    03/09/14 4:16 AM   Result  Value  Ref Range    Troponin I  0.43 (*)  <0.30 ng/mL    Comment:      Due to the release kinetics of cTnI,     a negative result within the first hours     of the onset of symptoms does not rule out     myocardial infarction with certainty.     If myocardial infarction is still suspected,     repeat the test at appropriate intervals.     CRITICAL VALUE NOTED. VALUE IS CONSISTENT WITH PREVIOUSLY REPORTED AND CALLED VALUE.   GLUCOSE, CAPILLARY Status: Abnormal    Collection Time    03/09/14 4:18 AM   Result  Value  Ref Range    Glucose-Capillary  176 (*)  70 - 99 mg/dL   GLUCOSE, CAPILLARY Status: Abnormal    Collection Time    03/09/14 5:19 AM   Result  Value  Ref Range    Glucose-Capillary  134 (*)  70 - 99 mg/dL   GLUCOSE, CAPILLARY Status: Abnormal    Collection Time    03/09/14 6:10 AM   Result  Value  Ref Range    Glucose-Capillary  105 (*)  70 - 99 mg/dL   GLUCOSE, CAPILLARY Status: Abnormal    Collection Time    03/09/14 7:35 AM   Result  Value  Ref Range    Glucose-Capillary  167 (*)  70 - 99 mg/dL   BASIC METABOLIC PANEL Status: Abnormal    Collection Time    03/09/14 8:00 AM   Result  Value  Ref Range    Sodium  139  137 - 147 mEq/L    Potassium  3.7  3.7 - 5.3 mEq/L    Chloride  109   96 - 112 mEq/L    CO2  12 (*)  19 - 32 mEq/L    Glucose, Bld  181 (*)  70 - 99 mg/dL    BUN  18  6 - 23 mg/dL    Creatinine, Ser  0.96  0.50 - 1.10 mg/dL    Calcium  7.9 (*)  8.4 - 10.5 mg/dL    GFR calc non Af Amer  66 (*)  >90 mL/min    GFR calc Af Amer  77 (*)  >90 mL/min    Comment:  (NOTE)     The eGFR has been calculated using the CKD EPI equation.     This calculation has not been validated in all clinical situations.     eGFR's persistently <90 mL/min signify possible Chronic Kidney     Disease.   GLUCOSE, CAPILLARY Status: Abnormal    Collection Time    03/09/14 8:35 AM   Result  Value  Ref Range    Glucose-Capillary  199 (*)  70 - 99 mg/dL   GLUCOSE, CAPILLARY Status: Abnormal    Collection Time    03/09/14 9:43 AM   Result  Value  Ref Range    Glucose-Capillary  176 (*)  70 - 99 mg/dL   TROPONIN I Status: Abnormal    Collection Time    03/09/14 9:57 AM   Result  Value  Ref Range    Troponin I  0.52 (*)  <0.30 ng/mL    Comment:      Due to the release kinetics of cTnI,     a negative result within the first hours     of the onset of symptoms does not rule out     myocardial infarction with certainty.     If myocardial infarction is still suspected,     repeat the test at appropriate intervals.     CRITICAL VALUE NOTED. VALUE IS CONSISTENT WITH PREVIOUSLY REPORTED AND CALLED VALUE.   GLUCOSE, CAPILLARY Status: Abnormal    Collection Time    03/09/14 10:50 AM   Result  Value  Ref Range    Glucose-Capillary  151 (*)  70 - 99 mg/dL   GLUCOSE, CAPILLARY Status: Abnormal    Collection Time    03/09/14 11:54 AM   Result  Value  Ref Range    Glucose-Capillary  144 (*)  70 - 99 mg/dL   BASIC METABOLIC PANEL Status: Abnormal    Collection Time    03/09/14 12:00 PM   Result  Value  Ref Range    Sodium  140  137 - 147 mEq/L    Potassium  4.0  3.7 - 5.3 mEq/L    Chloride  110  96 - 112 mEq/L    CO2  17 (*)  19 - 32 mEq/L    Glucose, Bld  163 (*)  70 - 99 mg/dL     BUN  14  6 - 23 mg/dL    Creatinine, Ser  0.95  0.50 - 1.10 mg/dL    Calcium  7.7 (*)  8.4 - 10.5 mg/dL    GFR calc non Af Amer  67 (*)  >90 mL/min    GFR calc Af Amer  78 (*)  >90 mL/min    Comment:  (NOTE)  The eGFR has been calculated using the CKD EPI equation.     This calculation has not been validated in all clinical situations.     eGFR's persistently <90 mL/min signify possible Chronic Kidney     Disease.   HCG, QUANTITATIVE, PREGNANCY Status: None    Collection Time    03/09/14 12:00 PM   Result  Value  Ref Range    hCG, Beta Chain, Quant, S  3  <5 mIU/mL    Comment:      GEST. AGE CONC. (mIU/mL)     <=1 WEEK 5 - 50     2 WEEKS 50 - 500     3 WEEKS 100 - 10,000     4 WEEKS 1,000 - 30,000     5 WEEKS 3,500 - 115,000     6-8 WEEKS 12,000 - 270,000     12 WEEKS 15,000 - 220,000         FEMALE AND NON-PREGNANT FEMALE:     LESS THAN 5 mIU/mL     Performed at [facility], CAPILLARY Status: Abnormal    Collection Time    03/09/14 12:59 PM   Result  Value  Ref Range    Glucose-Capillary  131 (*)  70 - 99 mg/dL   GLUCOSE, CAPILLARY Status: Abnormal    Collection Time    03/09/14 2:02 PM   Result  Value  Ref Range    Glucose-Capillary  118 (*)  70 - 99 mg/dL   GLUCOSE, CAPILLARY Status: Abnormal    Collection Time    03/09/14 3:12 PM   Result  Value  Ref Range    Glucose-Capillary  122 (*)  70 - 99 mg/dL   BASIC METABOLIC PANEL Status: Abnormal    Collection Time    03/09/14 4:00 PM   Result  Value  Ref Range    Sodium  138  137 - 147 mEq/L    Potassium  4.1  3.7 - 5.3 mEq/L    Chloride  110  96 - 112 mEq/L    CO2  17 (*)  19 - 32 mEq/L    Glucose, Bld  251 (*)  70 - 99 mg/dL    BUN  11  6 - 23 mg/dL    Creatinine, Ser  0.85  0.50 - 1.10 mg/dL    Calcium  7.7 (*)  8.4 - 10.5 mg/dL    GFR calc non Af Amer  77 (*)  >90 mL/min    GFR calc Af Amer  89 (*)  >90 mL/min    Comment:  (NOTE)     The eGFR has been calculated using the CKD  EPI equation.     This calculation has not been validated in all clinical situations.     eGFR's persistently <90 mL/min signify possible Chronic Kidney     Disease.   GLUCOSE, CAPILLARY Status: Abnormal    Collection Time    03/09/14 4:50 PM   Result  Value  Ref Range    Glucose-Capillary  177 (*)  70 - 99 mg/dL   MAGNESIUM Status: None    Collection Time    03/09/14 5:00 PM   Result  Value  Ref Range    Magnesium  2.2  1.5 - 2.5 mg/dL   PHOSPHORUS Status: None    Collection Time    03/09/14 5:00 PM   Result  Value  Ref Range    Phosphorus  2.3  2.3 -  4.6 mg/dL   HEPARIN LEVEL (UNFRACTIONATED) Status: Abnormal    Collection Time    03/09/14 5:00 PM   Result  Value  Ref Range    Heparin Unfractionated  0.73 (*)  0.30 - 0.70 IU/mL    Comment:      IF HEPARIN RESULTS ARE BELOW     EXPECTED VALUES, AND PATIENT     DOSAGE HAS BEEN CONFIRMED,     SUGGEST FOLLOW UP TESTING     OF ANTITHROMBIN III LEVELS.   CBC Status: Abnormal    Collection Time    03/09/14 5:00 PM   Result  Value  Ref Range    WBC  11.4 (*)  4.0 - 10.5 K/uL    RBC  3.31 (*)  3.87 - 5.11 MIL/uL    Hemoglobin  9.7 (*)  12.0 - 15.0 g/dL    HCT  27.6 (*)  36.0 - 46.0 %    MCV  83.4  78.0 - 100.0 fL    MCH  29.3  26.0 - 34.0 pg    MCHC  35.1  30.0 - 36.0 g/dL    RDW  13.7  11.5 - 15.5 %    Platelets  146 (*)  150 - 400 K/uL    Comment:  DELTA CHECK NOTED     REPEATED TO VERIFY     SPECIMEN CHECKED FOR CLOTS   GLUCOSE, CAPILLARY Status: Abnormal    Collection Time    03/09/14 5:58 PM   Result  Value  Ref Range    Glucose-Capillary  243 (*)  70 - 99 mg/dL   GLUCOSE, CAPILLARY Status: Abnormal    Collection Time    03/09/14 7:11 PM   Result  Value  Ref Range    Glucose-Capillary  174 (*)  70 - 99 mg/dL   GLUCOSE, CAPILLARY Status: Abnormal    Collection Time    03/09/14 7:29 PM   Result  Value  Ref Range    Glucose-Capillary  172 (*)  70 - 99 mg/dL    Comment 1  Notify RN    TROPONIN I Status:  Abnormal    Collection Time    03/09/14 8:30 PM   Result  Value  Ref Range    Troponin I  0.31 (*)  <0.30 ng/mL    Comment:      Due to the release kinetics of cTnI,     a negative result within the first hours     of the onset of symptoms does not rule out     myocardial infarction with certainty.     If myocardial infarction is still suspected,     repeat the test at appropriate intervals.     CRITICAL VALUE NOTED. VALUE IS CONSISTENT WITH PREVIOUSLY REPORTED AND CALLED VALUE.   BASIC METABOLIC PANEL Status: Abnormal    Collection Time    03/09/14 8:30 PM   Result  Value  Ref Range    Sodium  137  137 - 147 mEq/L    Potassium  3.9  3.7 - 5.3 mEq/L    Chloride  111  96 - 112 mEq/L    CO2  17 (*)  19 - 32 mEq/L    Glucose, Bld  213 (*)  70 - 99 mg/dL    BUN  9  6 - 23 mg/dL    Creatinine, Ser  0.87  0.50 - 1.10 mg/dL    Calcium  8.0 (*)  8.4 - 10.5 mg/dL  GFR calc non Af Amer  75 (*)  >90 mL/min    GFR calc Af Amer  87 (*)  >90 mL/min    Comment:  (NOTE)     The eGFR has been calculated using the CKD EPI equation.     This calculation has not been validated in all clinical situations.     eGFR's persistently <90 mL/min signify possible Chronic Kidney     Disease.   GLUCOSE, CAPILLARY Status: Abnormal    Collection Time    03/09/14 8:35 PM   Result  Value  Ref Range    Glucose-Capillary  204 (*)  70 - 99 mg/dL   GLUCOSE, CAPILLARY Status: Abnormal    Collection Time    03/09/14 11:35 PM   Result  Value  Ref Range    Glucose-Capillary  150 (*)  70 - 99 mg/dL    Comment 1  Notify RN    GLUCOSE, CAPILLARY Status: Abnormal    Collection Time    03/10/14 3:44 AM   Result  Value  Ref Range    Glucose-Capillary  226 (*)  70 - 99 mg/dL    Comment 1  Notify RN    MAGNESIUM Status: None    Collection Time    03/10/14 3:50 AM   Result  Value  Ref Range    Magnesium  2.0  1.5 - 2.5 mg/dL   PHOSPHORUS Status: None    Collection Time    03/10/14 3:50 AM   Result  Value   Ref Range    Phosphorus  2.5  2.3 - 4.6 mg/dL   HEPARIN LEVEL (UNFRACTIONATED) Status: None    Collection Time    03/10/14 3:50 AM   Result  Value  Ref Range    Heparin Unfractionated  0.63  0.30 - 0.70 IU/mL    Comment:      IF HEPARIN RESULTS ARE BELOW     EXPECTED VALUES, AND PATIENT     DOSAGE HAS BEEN CONFIRMED,     SUGGEST FOLLOW UP TESTING     OF ANTITHROMBIN III LEVELS.   PROTIME-INR Status: Abnormal    Collection Time    03/10/14 3:50 AM   Result  Value  Ref Range    Prothrombin Time  23.2 (*)  11.6 - 15.2 seconds    INR  2.14 (*)  0.00 - 1.49   LIPID PANEL Status: None    Collection Time    03/10/14 3:50 AM   Result  Value  Ref Range    Cholesterol  111  0 - 200 mg/dL    Triglycerides  90  <150 mg/dL    HDL  43  >39 mg/dL    Total CHOL/HDL Ratio  2.6     VLDL  18  0 - 40 mg/dL    LDL Cholesterol  50  0 - 99 mg/dL    Comment:      Total Cholesterol/HDL:CHD Risk     Coronary Heart Disease Risk Table     Men Women     1/2 Average Risk 3.4 3.3     Average Risk 5.0 4.4     2 X Average Risk 9.6 7.1     3 X Average Risk 23.4 11.0         Use the calculated Patient Ratio     above and the CHD Risk Table     to determine the patient's CHD Risk.         ATP III  CLASSIFICATION (LDL):     <100 mg/dL Optimal     100-129 mg/dL Near or Above     Optimal     130-159 mg/dL Borderline     160-189 mg/dL High     >190 mg/dL Very High   CBC Status: Abnormal    Collection Time    03/10/14 3:50 AM   Result  Value  Ref Range    WBC  9.3  4.0 - 10.5 K/uL    RBC  3.24 (*)  3.87 - 5.11 MIL/uL    Hemoglobin  9.3 (*)  12.0 - 15.0 g/dL    HCT  26.9 (*)  36.0 - 46.0 %    MCV  83.0  78.0 - 100.0 fL    MCH  28.7  26.0 - 34.0 pg    MCHC  34.6  30.0 - 36.0 g/dL    RDW  13.9  11.5 - 15.5 %    Platelets  132 (*)  150 - 400 K/uL   BASIC METABOLIC PANEL Status: Abnormal    Collection Time    03/10/14 3:50 AM   Result  Value  Ref Range    Sodium  141  137 - 147 mEq/L    Potassium   3.9  3.7 - 5.3 mEq/L    Chloride  111  96 - 112 mEq/L    CO2  20  19 - 32 mEq/L    Glucose, Bld  246 (*)  70 - 99 mg/dL    BUN  9  6 - 23 mg/dL    Creatinine, Ser  0.82  0.50 - 1.10 mg/dL    Calcium  7.8 (*)  8.4 - 10.5 mg/dL    GFR calc non Af Amer  80 (*)  >90 mL/min    GFR calc Af Amer  >90  >90 mL/min    Comment:  (NOTE)     The eGFR has been calculated using the CKD EPI equation.     This calculation has not been validated in all clinical situations.     eGFR's persistently <90 mL/min signify possible Chronic Kidney     Disease.   TROPONIN I Status: None    Collection Time    03/10/14 3:50 AM   Result  Value  Ref Range    Troponin I  <0.30  <0.30 ng/mL    Comment:      Due to the release kinetics of cTnI,     a negative result within the first hours     of the onset of symptoms does not rule out     myocardial infarction with certainty.     If myocardial infarction is still suspected,     repeat the test at appropriate intervals.   PROCALCITONIN Status: None    Collection Time    03/10/14 4:00 AM   Result  Value  Ref Range    Procalcitonin  0.86     Comment:      Interpretation:     PCT > 0.5 ng/mL and <= 2 ng/mL:     Systemic infection (sepsis) is possible,     but other conditions are known to elevate     PCT as well.     (NOTE)     ICU PCT Algorithm Non ICU PCT Algorithm     ---------------------------- ------------------------------     PCT < 0.25 ng/mL PCT < 0.1 ng/mL     Stopping of antibiotics Stopping of antibiotics     strongly  encouraged. strongly encouraged.     ---------------------------- ------------------------------     PCT level decrease by PCT < 0.25 ng/mL     >= 80% from peak PCT     OR PCT 0.25 - 0.5 ng/mL Stopping of antibiotics     encouraged.     Stopping of antibiotics     encouraged.     ---------------------------- ------------------------------     PCT level decrease by PCT >= 0.25 ng/mL     < 80% from peak PCT     AND PCT >= 0.5  ng/mL Continuing antibiotics     encouraged.     Continuing antibiotics     encouraged.     ---------------------------- ------------------------------     PCT level increase compared PCT > 0.5 ng/mL     with peak PCT AND     PCT >= 0.5 ng/mL Escalation of antibiotics     strongly encouraged.     Escalation of antibiotics     strongly encouraged.   GLUCOSE, CAPILLARY Status: None    Collection Time    03/10/14 7:19 AM   Result  Value  Ref Range    Glucose-Capillary  89  70 - 99 mg/dL    Ct Head Wo Contrast  03/08/2014 CLINICAL DATA: Altered mental status EXAM: CT HEAD WITHOUT CONTRAST TECHNIQUE: Contiguous axial images were obtained from the base of the skull through the vertex without intravenous contrast. COMPARISON: None. FINDINGS: Extensive chronic ischemic changes are present. There is severe encephalomalacia in the right parietal lobe. Extensive chronic ischemic changes in the periventricular white matter. Encephalomalacia from focal infarcts in the left occipital and parietal lobes is also present. There is low-density in the gray matter and adjacent white matter of the right frontal lobe with effacement of the sulci and intermediate Hounsfield unit measurements. This has a subacute appearance. No evidence of acute intracranial hemorrhage. No midline shift. Mild global atrophy. IMPRESSION: Extensive chronic ischemic changes as described. 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 Electronically Signed By: Maryclare Bean M.D. On: 03/08/2014 18:04  Dg Chest Port 1 View  03/08/2014 CLINICAL DATA: Central line placement. EXAM: PORTABLE CHEST - 1 VIEW COMPARISON: 03/08/2014 FINDINGS: Prior median sternotomy and valve replacement. Endotracheal tube remains approximately 2 cm above the carina. Right central line is in place with the tip at the cavoatrial junction. No pneumothorax. Hyperinflation of the lungs. No confluent opacities. No effusions. No acute bony abnormality.  IMPRESSION: Right central line tip at the cavoatrial junction. No pneumothorax. Mild hyperinflation. No acute cardiopulmonary disease. Electronically Signed By: Rolm Baptise M.D. On: 03/08/2014 13:49  Dg Abd Portable 1v  03/08/2014 CLINICAL DATA: NG tube placement. EXAM: PORTABLE ABDOMEN - 1 VIEW COMPARISON: None. FINDINGS: NG tube coils in the fundus. The catheter then passes distally with the tip in the antrum of the stomach. Calcified fibroid in the pelvis. Nonobstructive bowel gas pattern. No free air organomegaly. IMPRESSION: NG tube coils in the fundus with the tip in the antrum. Electronically Signed By: Rolm Baptise M.D. On: 03/08/2014 13:53   Post Admission Physician Evaluation:  1. 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 2. Patient is admitted to receive collaborative, interdisciplinary care between the physiatrist, rehab nursing staff, and therapy team. 3. Patient's level of medical complexity and substantial therapy needs in context of that medical necessity cannot be provided at a lesser intensity of care such as a SNF. 4. Patient has experienced substantial functional  loss from his/her baseline which was documented above under the "Functional History" and "Functional Status" headings. Judging by the patient's diagnosis, physical exam, and functional history, the patient has potential for functional progress which will result in measurable gains while on inpatient rehab. These gains will be of substantial and practical use upon discharge in facilitating mobility and self-care at the household level. 5. Physiatrist will provide 24 hour management of medical needs as well as oversight of the therapy plan/treatment and provide guidance as appropriate regarding the interaction of the two. 6. 24 hour rehab nursing will assist with bladder management, bowel management, safety, skin/wound care, disease management, medication  administration and patient education and help integrate therapy concepts, techniques,education, etc. 7. PT will assess and treat for/with: Lower extremity strength, range of motion, stamina, balance, functional mobility, safety, adaptive techniques and equipment, spasticity mgt, visual perceptual awareness, cognitive perceptual awareness, stroke education. Goals are: mod I to supervision. 8. OT will assess and treat for/with: ADL's, functional mobility, safety, upper extremity strength, adaptive techniques and equipment, NMR, visual-perceptual awareness. Goals are: mod I to supervision. 9. SLP will assess and treat for/with: cognitive perceptual awareness. Goals are: mod I to supervision. 10. Case Management and Social Worker will assess and treat for psychological issues and discharge planning. 11. Team conference will be held weekly to assess progress toward goals and to determine barriers to discharge. 12. Patient will receive at least 3 hours of therapy per day at least 5 days per week. 13. ELOS: 10 days  14. Prognosis: excellent 15.  Medical Problem List and Plan:  1. 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 DVT Prophylaxis/Anticoagulation: Heparin Coumadin therapy with history of aortic valve replacement. INR goal 2.5-3.5.  3. Pain Management: Tylenol as needed.  4. Neuropsych: This patient is capable of making decisions on her own behalf.  5. Diabetes mellitus with peripheral neuropathy. Hemoglobin A1c pending. Levemir 10 units daily. Check blood sugars a.c. and at bedtime  6. CAD with CABG. Aspirin 81 mg daily. No chest pain or shortness of breath  7. Hyperlipidemia. Zocor  8. Insomnia.Rozerem   Meredith Staggers, MD, [facility]   03/10/2014

## 2014-03-12 NOTE — Progress Notes (Signed)
PMR Admission Coordinator Pre-Admission Assessment  Patient: Madison [name] is an 53 y.o., female  MRN: [id number]  DOB: [birth year]  Height: _0  (172.7 cm)  Weight: 74.2 kg (163 lb 9.3 oz)  Insurance Information  HMO: Yes PPO: PCP: IPA: 80/20: OTHER: Group # [id number]  PRIMARY: [employer] HMO Policy#: [id number] Subscriber: Alanson Aly  CM Name: Rema Fendt Phone#: [phone] Fax#: [phone]  Pre-Cert#: [id number] with follow up by onsite reviewer Employer: Not employed  Benefits: Phone #: [phone] Name: Zadie Cleverly. Date: 11/19/13 Deduct: $0 Out of Pocket Max: $4900 (met$0) Life Max: unlimited  CIR: $245 copay per admit SNF: $0 days 1-20; $166 days 21-100  Outpatient: no limits Co-Pay: $10/visit  Home Health: 100% Co-Pay: none  DME: 80% Co-Pay: 20%  Providers: in network   SECONDARY: Medicaid of [place] Policy#: [id number] Subscriber: Alanson Aly  CM Name: Phone#: Fax#:  Pre-Cert#: Employer: Not employed  Benefits: Phone #: [phone] Name: Automated  Eff. 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 right-handed female with history of diabetes mellitus, hypertension, CAD with CABG as well as aortic valve replacement on chronic Coumadin. Patient was independent prior to admission with ADLs and mobility. Admitted 03/08/2014 with altered mental status, hypoglycemia and acute respiratory failure. Patient was intubated in the emergency room. Noted blood sugar of 1124 as well as potassium 7.2. 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 INR on admission of 1.37. Echocardiogram and carotid Dopplers are pending. Critical medicine consulted for severe DKA metabolic acidosis hyperkalemia. Placed on intravenous fluids. Patient was extubated 03/09/2014. Neurology services consulted patient presently remains on Coumadin therapy with heparin bridge for secondary stroke prevention. Maintained on a dysphagia 3 thin liquid diet. Physical therapy evaluation completed 03/09/2014 with recommendations for physical medicine rehabilitation consult. Patient to be admitted for comprehensive rehabilitation program.    Past Medical History    Past Medical History    Diagnosis  Date    .  CAD (coronary artery disease)     .  Obesity     .  Hypercholesteremia     .  HTN (hypertension)     .  Dyslipidemia     .  Stroke syndrome     .  Aortic stenosis     .  Thrombophlebitis     .  Diabetes     .  Hyperlipidemia     .  Rheumatoid arthritis(714.0)      Family History  family history includes Stroke in an other family member.  Prior Rehab/Hospitalizations: Had HH therapies after previous CVA  Current Medications  Current facility-administered medications:aspirin chewable tablet 81 mg, 81 mg, Oral, Daily, Donzetta Starch, NP, 81 mg at 03/12/14 1011; baclofen (LIORESAL) tablet 10 mg, 10 mg, Oral, BID, Kelvin Cellar, MD, 10 mg at 03/12/14 1011; darifenacin (ENABLEX) 24 hr tablet 7.5 mg, 7.5 mg, Oral, Daily, Kelvin Cellar, MD, 7.5 mg at 03/12/14 1011; dextrose 50 % solution 25 mL, 25 mL, Intravenous, PRN, Soledad Gerlach  Blaine Hamper, MD  escitalopram (LEXAPRO) tablet 5 mg, 5 mg, Oral, Daily, Kelvin Cellar, MD, 5 mg at 03/12/14 1011; heparin ADULT infusion 100 units/mL (25000 units/250 mL), 900 Units/hr, Intravenous, Continuous, Rogue Bussing, [facility], Last Rate: 9 mL/hr at 03/12/14 0645, 900 Units/hr at 03/12/14 0645; insulin aspart (novoLOG) injection 0-15 Units, 0-15 Units, Subcutaneous, TID WC, Marjan Rabbani, MD, 8 Units at 03/12/14 0647  insulin aspart  protamine- aspart (NOVOLOG MIX 70/30) injection 20 Units, 20 Units, Subcutaneous, BID WC, Kelvin Cellar, MD, 20 Units at 03/12/14 0755; pantoprazole (PROTONIX) EC tablet 40 mg, 40 mg, Oral, Q1200, Kelvin Cellar, MD, 40 mg at 03/12/14 1218; ramelteon (ROZEREM) tablet 8 mg, 8 mg, Oral, QHS, Jones Bales, MD, 8 mg at 03/11/14 2130; simvastatin (ZOCOR) tablet 10 mg, 10 mg, Oral, q1800, Donzetta Starch, NP, 10 mg at 03/11/14 1724  sodium chloride 0.9 % injection 10-40 mL, 10-40 mL, Intracatheter, PRN, Kelvin Cellar, MD; sodium chloride 0.9 % injection 3 mL, 3 mL, Intravenous, Q12H, Ivor Costa, MD, 3 mL at 03/12/14 1000; Warfarin - Pharmacist Dosing Inpatient, , Does not apply, q1800, Ivor Costa, MD  Patients Current Diet: Carb Control  Precautions / Restrictions  Precautions  Precautions: Fall  Restrictions  Weight Bearing Restrictions: No  Prior Activity Level  Community (5-7x/wk): Goes to grocery store, to church, and to son's therapy appt every Tuesday. Sister does the driving.  Home Assistive Devices / Equipment  Home Assistive Devices/Equipment: Cane (specify quad or straight)  Home Equipment: Cane - single point (grab bar on tub)  Prior Functional Level  Prior Function  Level of Independence: Independent;Needs assistance  ADL's / Homemaking Assistance Needed: aid drives to appointments, cleans house, cooks by using microwave meals  Comments: Pt has button pull and rocker knife at home to help with fine motor deficits. Pt wants to return to this level of independence to take of 40 yo son  Current Functional Level    Cognition  Overall Cognitive Status: Impaired/Different from baseline  Current Attention Level: Selective  Orientation Level: Oriented X4    Extremity Assessment  (includes Sensation/Coordination)      ADLs  Overall ADL's : Needs assistance/impaired  Grooming: Set up;Standing;Cueing for sequencing  Grooming Details (indicate cue type and reason): requires extended time and  OT to loosen tooth paste lid to open and apply paste  Toilet Transfer: Minimal assistance;Cueing for sequencing;Regular Toilet;Grab bars  Toilet Transfer Details (indicate cue type and reason): UE use to control descend  Toileting- Clothing Manipulation and Hygiene: Set up;Sitting/lateral lean  Functional mobility during ADLs: Minimal assistance  General ADL Comments: pt demonstrates LT UE deficits and decr gait velocity . pt with decr balance and requires UE support for ambulation and dynamic standing.    Mobility  Overal bed mobility: Needs Assistance  Bed Mobility: Supine to Sit;Sit to Supine  Supine to sit: Min guard;HOB elevated  Sit to supine: Min assist  General bed mobility comments: in chair    Transfers  Overall transfer level: Needs assistance  Equipment used: 1 person hand held assist  Transfers: Sit to/from Stand  Sit to Stand: Min assist  General transfer comment: requires BIL UE use    Ambulation / Gait / Stairs / Wheelchair Mobility  Ambulation/Gait  Ambulation/Gait assistance: Min guard;Min assist  Ambulation Distance (Feet): 350 Feet  Assistive device: 1 person hand held assist;None  Gait Pattern/deviations: Step-to pattern;Decreased stride length;Decreased step length - left;Drifts right/left  Gait velocity interpretation: Below normal speed for age/gender  General Gait  Details: Pt drifts left when ambulating and occasionally bumped into wall and objects on left. Pt did not lose balance and was not using a device during large portion of ambulation. When challenged, pt does lose balance and needs asssist to maintain stability. Upon further questioning, pt has to care for 51 year old son. Will need to be fully independent as he has ADHD and she assists himwith bathing and brushing his teeth prior to him catching bus in the ams. Recently had aide start assisting pt in the home per pt.    Posture / Balance  High Level Balance  High Level Balance Comments: decr dynamic standing  balance with ADLS. pt leaning on counter surface for support    Special needs/care consideration  BiPAP/CPAP No  CPM No  Continuous Drip IV Heparin drip as of 03/12/14  Dialysis No  Life Vest No  Oxygen No  Special Bed No  Trach Size No  Wound Vac (area)No  Skin Bruising around L neck  Bowel mgmt: Last BM 03/12/14  Bladder mgmt: Using BSC with assistance, some urinary urgency, some incontinence during coughing/sneezing  Diabetic mgmt Yes, on insulin at home    Previous Home Environment  Living Arrangements: Children  Available Help at Discharge: Personal care attendant  Type of Home: House  Home Layout: One level  Home Access: Level entry  Bathroom Shower/Tub: Administrator, Civil Service: Standard  Home Care Services: Yes  Type of Home Care Services: Homehealth aide (80 hrs per wk )  Additional Comments: sister can come and stay with pt. Son with ADHD and pt has to care for him and get him on the bus. Sister supportive per pt.  Discharge Living Setting  Plans for Discharge Living Setting: Patient's home;Lives with (comment);Apartment  Type of Home at Discharge: Apartment  Discharge Home Layout: One level  Discharge Home Access: Stairs to enter  Entrance Stairs-Number of Steps: 9-10 steps down to bottom floor apartment. Laundry on bottom level as well.  Does the patient have any problems obtaining your medications?: No  Social/Family/Support Systems  Patient Roles: Parent (Has a 96 yr old son and a 55 yr old son.)  Contact Information: [name] - sister [phone]  Anticipated Caregiver: self and 20 yo son in June.  Ability/Limitations of Caregiver: Sister is local. Sister currently keeping 25 yo son. Sister drives for patient. Son is adopted and has severe ADHD.  Caregiver Availability: Intermittent. Has a long time family friend that helps with repairs/groceries. Has an aide who comes 7 days a week, 80 hrs/month. Aide comes in at 6 am and stays all day. Aide does  household chores. Patient has trouble opening up containers due to impaired left hand. Also neighbors are supportive.  Discharge Plan Discussed with Primary Caregiver: Yes  Is Caregiver In Agreement with Plan?: Yes  Does Caregiver/Family have Issues with Lodging/Transportation while Pt is in Rehab?: No  Goals/Additional Needs  Patient/Family Goal for Rehab: PT/OT/ST mod I goals  Expected length of stay: 10-14 days  Cultural Considerations: Attends church  Dietary Needs: Carb mod med cal, thin liquids  Equipment Needs: TBD  Additional Information: Received Medicaid in 2010 after divorce after moving here from [place], [place].  Pt/Family Agrees to Admission and willing to participate: Yes  Program Orientation Provided & Reviewed with Pt/Caregiver Including Roles & Responsibilities: Yes  Decrease burden of Care through IP rehab admission: N/A  Possible need for SNF placement upon discharge: Not anticipated  Patient Condition: This patient's medical and functional  status has changed since the consult dated: 03/10/14 in which the Rehabilitation Physician determined and documented that the patient's condition is appropriate for intensive rehabilitative care in an inpatient rehabilitation facility. See "History of Present Illness" (above) for medical update. Functional changes are: Currently requiring min to minguard assist to amb 350 feet +1 HHA. Patient's medical and functional status update has been discussed with the Rehabilitation physician and patient remains appropriate for inpatient rehabilitation. Will admit to inpatient rehab today.  Preadmission Screen Completed By: Retta Diones, 03/12/2014 1:51 PM  ______________________________________________________________________  Discussed status with Dr. Naaman Plummer on 03/12/14 at 1414 and received telephone approval for admission today.  Admission Coordinator: Retta Diones, time1414/Date04/24/15    Cosigned by: Meredith Staggers, MD [03/12/2014 2:23 PM]

## 2014-03-12 NOTE — Discharge Summary (Signed)
Physician Discharge Summary  Safire Gordin KVQ:[id number] DOB: 1961-08-13 DOA: 03/08/2014  PCP: Janell Quiet, FNP  Admit date: 03/08/2014 Discharge date: 03/12/2014  Time spent: 35 minutes  Recommendations for Outpatient Follow-up:  1. Please continue to check daily PT/INR's with an INR goal of 2.5-3.5. She should be on Heparin gtt until then.  2. Once her INR is within goal, her heparin gtt can be discontinued. 3. Accuchecks q AC q HS   Discharge Diagnoses:  Principal Problem:   Septic shock(785.52) Active Problems:   DKA (diabetic ketoacidoses)   Severe sepsis(995.92)   Acute respiratory failure   AKI (acute kidney injury)   CVA (cerebral infarction)   Type I (juvenile type) diabetes mellitus with neurological manifestations, not stated as uncontrolled   Metabolic acidosis   Discharge Condition: Stable  Diet recommendation: Heart Healthy  Filed Weights   03/10/14 0353 03/11/14 0500 03/12/14 0544  Weight: 78.6 kg (173 lb 4.5 oz) 75.705 kg (166 lb 14.4 oz) 74.2 kg (163 lb 9.3 oz)    History of present illness:   The patient is 53 year old female, with PMH of HTN, HLD, aortic valve replacement on coumadin (INR 1.37), MD-II ( 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 Per EMS report patient last seen normal last night. Her son called EMS because she was not acting like herself. On arrival EMS found patient unresponsive to painful stimuli. CBG on BMP 1124 in ED. Lactic acid 9.81 and high AG with bicarbonate <7.0. PCCM called for admission. Patient is intubated in ED.   Hospital Course:  Patient is a pleasant 53 year old female with a past medical history of aortic valve replacement with mechanical valve, presented with mental status changes in setting of hypoglycemia, DKA, imaging studies revealing an acute/subacute anterior frontal CVA. Patient was not as treated with TPA. Admitted to the pulmonary critical care service where she  underwent rapid sequence intubation. She was successfully extubated on 03/09/2014. Transferred to telemetry on 03/10/2014.  1. CVA -CT scan of brain performed on 03/08/2014 showing right frontal lobe infarct having subacute appearance  -Patient with history of aortic valve replacement, presenting with a subtherapeutic INR of 1.37.  -Neurology consulted, it remains an IV heparin with Coumadin bridge.  -Coumadin restarted on 03/09/2014, pharmacy consulted for Coumadin dosing  -Remains sub-therapeutic, continue Hep gtt   2. Acute hypoxemic respiratory failure  -Status post rapid sequence intubation, admitted to the pulmonary critical care service, extubated on 03/09/2014   3. Status post aortic valve replacement  -Remains on IV heparin until Coumadin therapeutic  -Pharmacy consulted for Coumadin dosing, INR goal 2.5-3.5   4. Diabetic ketoacidosis versus hyperosmolar hyperglycemic state  -Resolved  -Blood sugars suboptimally controlled  with CBG's in the 300's on 03/11/14 -Changed to insulin 70/30 20 units subcutaneous twice a day on 03/11/14  -Continue Accu-Cheks q. a.c. and each bedtime with vascular coverage  -BS seem improved   5. Gastroesophageal reflux disease  -Continue PPI therapy   6. Acute kidney injury  -Likely secondary to hypotension/sepsis/prerenal azotemia, resolved    SIGNIFICANT EVENTS / STUDIES:  4/20 Intubated  4/20 CT Head Right frontal lobe infarct which has a subacute appearance  4/21 Extubated successfully. Heparin started with coumadin bridge  4/21 2D echo with EF 60-65%, grade 1 diastolic CHF  LINES / TUBES:  ETT 4/20>>>4/21  CVL Left Ij 4/20 >>>  NG tube 4/20>>>4/21  CULTURES:  MRSA 4/20 >> NEG  Urine 4/20 >>NG  Blood 4/20>>  ANTIBIOTICS:  4/20  Vanco 4/20 Unasyn   Consultations:  PCCM  Neurology  Discharge Exam: Filed Vitals:   03/12/14 0544  BP: 122/51  Pulse: 73  Temp: 98.4 F (36.9 C)  Resp: 18     Discharge Instructions You were  cared for by a hospitalist during your hospital stay. If you have any questions about your discharge medications or the care you received while you were in the hospital after you are discharged, you can call the unit and asked to speak with the hospitalist on call if the hospitalist that took care of you is not available. Once you are discharged, your primary care physician will handle any further medical issues. Please note that NO REFILLS for any discharge medications will be authorized once you are discharged, as it is imperative that you return to your primary care physician (or establish a relationship with a primary care physician if you do not have one) for your aftercare needs so that they can reassess your need for medications and monitor your lab values.      Discharge Orders   Future Appointments Provider Department Dept Phone   05/31/2014 9:30 AM Baker Pierini, FNP [facility] [phone]   Future Orders Complete By Expires   Ambulatory referral to Nutrition and Diabetic Education  As directed    Diet - low sodium heart healthy  As directed    Increase activity slowly  As directed        Medication List    STOP taking these medications       Insulin Aspart Prot & Aspart (70-30) 100 UNIT/ML Pen  Commonly known as:  NOVOLOG MIX 70/30 FLEXPEN  Replaced by:  insulin aspart protamine- aspart (70-30) 100 UNIT/ML injection      TAKE these medications       aspirin 81 MG tablet  Take 81 mg by mouth daily.     baclofen 10 MG tablet  Commonly known as:  LIORESAL  Take 1 tablet (10 mg total) by mouth every morning.     escitalopram 5 MG tablet  Commonly known as:  LEXAPRO  Take 5 mg by mouth daily.     heparin 100-0.45 UNIT/ML-% infusion  Inject 1,050 Units/hr into the vein continuous.     insulin aspart 100 UNIT/ML injection  Commonly known as:  novoLOG  Inject 0-15 Units into the skin 3 (three) times daily with meals.     insulin aspart  protamine- aspart (70-30) 100 UNIT/ML injection  Commonly known as:  NOVOLOG MIX 70/30  Inject 0.2 mLs (20 Units total) into the skin 2 (two) times daily with a meal.     omeprazole 40 MG capsule  Commonly known as:  PRILOSEC  Take 1 capsule (40 mg total) by mouth daily.     pantoprazole 40 MG tablet  Commonly known as:  PROTONIX  Take 1 tablet (40 mg total) by mouth daily at 12 noon.     pravastatin 20 MG tablet  Commonly known as:  PRAVACHOL  Take 1 tablet (20 mg total) by mouth daily.     ramelteon 8 MG tablet  Commonly known as:  ROZEREM  Take 1 tablet (8 mg total) by mouth at bedtime.     SILENOR 6 MG Tabs  Generic drug:  Doxepin HCl  Take 6 mg by mouth at bedtime.     solifenacin 5 MG tablet  Commonly known as:  VESICARE  Take 5 mg by mouth daily.     warfarin 5 MG  tablet  Commonly known as:  COUMADIN  Take 5 mg by mouth daily.     warfarin 7.5 MG tablet  Commonly known as:  COUMADIN  Take 1 tablet (7.5 mg total) by mouth one time only at 6 PM.       Allergies  Allergen Reactions  . Celebrex [Celecoxib] Rash  . Detrol [Tolterodine]    Follow-up Information   Follow up with Gates Rigg, MD. Schedule an appointment as soon as possible for a visit in 2 months. (Stroke Clinic)    Specialties:  Neurology, Radiology   Contact information:   [address] [place] [address] [phone]        The results of significant diagnostics from this hospitalization (including imaging, microbiology, ancillary and laboratory) are listed below for reference.    Significant Diagnostic Studies: Ct Head Wo Contrast  03/08/2014   CLINICAL DATA:  Altered mental status  EXAM: CT HEAD WITHOUT CONTRAST  TECHNIQUE: Contiguous axial images were obtained from the base of the skull through the vertex without intravenous contrast.  COMPARISON:  None.  FINDINGS: Extensive chronic ischemic changes are present. There is severe encephalomalacia in the right  parietal lobe. Extensive chronic ischemic changes in the periventricular white matter. Encephalomalacia from focal infarcts in the left occipital and parietal lobes is also present. There is low-density in the gray matter and adjacent white matter of the right frontal lobe with effacement of the sulci and intermediate Hounsfield unit measurements. This has a subacute appearance. No evidence of acute intracranial hemorrhage. No midline shift. Mild global atrophy.  IMPRESSION: Extensive chronic ischemic changes as described. 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   Electronically Signed   By: Maryclare Bean M.D.   On: 03/08/2014 18:04   Dg Chest Port 1 View  03/08/2014   CLINICAL DATA:  Central line placement.  EXAM: PORTABLE CHEST - 1 VIEW  COMPARISON:  03/08/2014  FINDINGS: Prior median sternotomy and valve replacement. Endotracheal tube remains approximately 2 cm above the carina. Right central line is in place with the tip at the cavoatrial junction. No pneumothorax.  Hyperinflation of the lungs. No confluent opacities. No effusions. No acute bony abnormality.  IMPRESSION: Right central line tip at the cavoatrial junction.  No pneumothorax.  Mild hyperinflation.  No acute cardiopulmonary disease.   Electronically Signed   By: Charlett Nose M.D.   On: 03/08/2014 13:49   Dg Chest Portable 1 View  03/08/2014   CLINICAL DATA:  Intubation, central line placement  EXAM: PORTABLE CHEST - 1 VIEW  COMPARISON:  Portable exam 1116 hr compared to 03/08/2014 at 0849 hr  FINDINGS: Tip of endotracheal tube projects 2.2 cm above carinal.  No central venous catheter identified.  Normal heart size post AVR.  Mediastinal contours and pulmonary vascularity normal.  Lungs clear.  No pleural effusion, pneumothorax or acute osseous findings.  IMPRESSION: No acute abnormalities.   Electronically Signed   By: Ulyses Southward M.D.   On: 03/08/2014 11:41   Dg Chest Portable 1 View  03/08/2014   CLINICAL DATA:   Altered mental status.  Hyperglycemia.  EXAM: PORTABLE CHEST - 1 VIEW  COMPARISON:  None.  FINDINGS: There has been previous median sternotomy and aortic valve replacement. Heart size is normal. The lungs are clear. The vascularity is normal. No effusions.  IMPRESSION: Previous AVR.  No active disease.   Electronically Signed   By: Paulina Fusi M.D.   On: 03/08/2014 09:04  Dg Abd Portable 1v  03/08/2014   CLINICAL DATA:  NG tube placement.  EXAM: PORTABLE ABDOMEN - 1 VIEW  COMPARISON:  None.  FINDINGS: NG tube coils in the fundus. The catheter then passes distally with the tip in the antrum of the stomach.  Calcified fibroid in the pelvis. Nonobstructive bowel gas pattern. No free air organomegaly.  IMPRESSION: NG tube coils in the fundus with the tip in the antrum.   Electronically Signed   By: Charlett Nose M.D.   On: 03/08/2014 13:53    Microbiology: Recent Results (from the past 240 hour(s))  CULTURE, BLOOD (ROUTINE X 2)     Status: None   Collection Time    03/08/14  8:31 AM      Result Value Ref Range Status   Specimen Description BLOOD RIGHT ANTECUBITAL   Final   Special Requests BOTTLES DRAWN AEROBIC AND ANAEROBIC 5CCS   Final   Culture  Setup Time     Final   Value: 03/08/2014 13:30     Performed at [facility]   Culture     Final   Value:        BLOOD CULTURE RECEIVED NO GROWTH TO DATE CULTURE WILL BE HELD FOR 5 DAYS BEFORE ISSUING A FINAL NEGATIVE REPORT     Performed at [facility]   Report Status PENDING   Incomplete  CULTURE, BLOOD (ROUTINE X 2)     Status: None   Collection Time    03/08/14  9:30 AM      Result Value Ref Range Status   Specimen Description BLOOD LEFT ANTECUBITAL   Final   Special Requests BOTTLES DRAWN AEROBIC AND ANAEROBIC 10CCS   Final   Culture  Setup Time     Final   Value: 03/08/2014 13:31     Performed at [facility]   Culture     Final   Value:        BLOOD CULTURE RECEIVED NO GROWTH TO DATE CULTURE WILL BE HELD FOR 5  DAYS BEFORE ISSUING A FINAL NEGATIVE REPORT     Performed at [facility]   Report Status PENDING   Incomplete  MRSA PCR SCREENING     Status: None   Collection Time    03/08/14 12:49 PM      Result Value Ref Range Status   MRSA by PCR NEGATIVE  NEGATIVE Final   Comment:            The GeneXpert MRSA Assay (FDA     approved for NASAL specimens     only), is one component of a     comprehensive MRSA colonization     surveillance program. It is not     intended to diagnose MRSA     infection nor to guide or     monitor treatment for     MRSA infections.  URINE CULTURE     Status: None   Collection Time    03/08/14  1:20 PM      Result Value Ref Range Status   Specimen Description URINE, CATHETERIZED   Final   Special Requests Normal   Final   Culture  Setup Time     Final   Value: 03/08/2014 17:16     Performed at [facility]   Colony Count     Final   Value: NO GROWTH     Performed at [facility]   Culture     Final  Value: NO GROWTH     Performed at [facility]   Report Status 03/09/2014 FINAL   Final     Labs: Basic Metabolic Panel:  Recent Labs Lab 03/09/14 1200 03/09/14 1600  03/09/14 2030 03/10/14 0350 03/10/14 1700 03/11/14 0428 03/11/14 1645 03/12/14 0503  NA 140 138  --  137 141  --  140  --   --   K 4.0 4.1  --  3.9 3.9  --  4.3  --   --   CL 110 110  --  111 111  --  107  --   --   CO2 17* 17*  --  17* 20  --  20  --   --   GLUCOSE 163* 251*  --  213* 246*  --  342*  --   --   BUN 14 11  --  9 9  --  11  --   --   CREATININE 0.95 0.85  --  0.87 0.82  --  0.74  --   --   CALCIUM 7.7* 7.7*  --  8.0* 7.8*  --  8.8  --   --   MG  --   --   < >  --  2.0 1.9 2.0 1.8 1.8  PHOS  --   --   < >  --  2.5 1.4* 2.8 2.6 2.9  < > = values in this interval not displayed. Liver Function Tests:  Recent Labs Lab 03/08/14 0831  AST 16  ALT 15  ALKPHOS 133*  BILITOT <0.2*  PROT 7.5  ALBUMIN 3.7   No results found for this  basename: LIPASE, AMYLASE,  in the last 168 hours No results found for this basename: AMMONIA,  in the last 168 hours CBC:  Recent Labs Lab 03/09/14 0415 03/09/14 1700 03/10/14 0350 03/11/14 0925 03/12/14 0503  WBC 15.9* 11.4* 9.3 6.7 4.6  HGB 10.3* 9.7* 9.3* 10.2* 10.0*  HCT 29.6* 27.6* 26.9* 30.5* 30.1*  MCV 81.3 83.4 83.0 85.0 86.0  PLT 203 146* 132* 134* 122*   Cardiac Enzymes:  Recent Labs Lab 03/08/14 1646 03/08/14 1900 03/08/14 2210 03/09/14 0416 03/09/14 0957 03/09/14 2030 03/10/14 0350  CKTOTAL  --  155  --   --   --   --   --   TROPONINI <0.30  --  0.32* 0.43* 0.52* 0.31* <0.30   BNP: BNP (last 3 results)  Recent Labs  10/23/13 1601  PROBNP 130.0*   CBG:  Recent Labs Lab 03/11/14 1128 03/11/14 1624 03/11/14 2122 03/12/14 0647 03/12/14 1102  GLUCAP 195* 202* 164* 282* 72       Signed:  Viktor Philipp  [facility] 03/12/2014, 2:32 PM

## 2014-03-12 NOTE — Progress Notes (Signed)
ANTICOAGULATION CONSULT NOTE - Follow Up Consult  Pharmacy Consult:  Heparin / Coumadin Indication:  AVR  Allergies  Allergen Reactions  . Celebrex [Celecoxib] Rash  . Detrol [Tolterodine]     Patient Measurements: Height: 5\' 8"  (172.7 cm) Weight: 163 lb 9.3 oz (74.2 kg) IBW/kg (Calculated) : 63.9 Heparin Dosing Weight: 79 kg  Vital Signs: Temp: 98.4 F (36.9 C) (04/24 0544) Temp src: Oral (04/24 0544) BP: 122/51 mmHg (04/24 0544) Pulse Rate: 73 (04/24 0544)  Labs:  Recent Labs  03/09/14 2030 03/10/14 0350 03/10/14 1912 03/11/14 0428 03/11/14 0925 03/12/14 0503  HGB  --  9.3*  --   --  10.2* 10.0*  HCT  --  26.9*  --   --  30.5* 30.1*  PLT  --  132*  --   --  134* 122*  LABPROT  --  23.2*  --  17.2*  --  16.1*  INR  --  2.14*  --  1.44  --  1.32  HEPARINUNFRC  --  0.63 0.49 0.41  --  0.18*  CREATININE 0.87 0.82  --  0.74  --   --   TROPONINI 0.31* <0.30  --   --   --   --     Estimated Creatinine Clearance: 82 ml/min (by C-G formula based on Cr of 0.74).     Assessment: 28 YOF s/p CVA and NSTEMI to continue on Coumadin and IV heparin bridge for history of AVR.  INR decreased further today, likely due to missed doses since PTA (on Coumadin 5mg  PO daily at home).  Heparin level sub-therapeutic this AM and remains low despite rate adjustment.  No bleeding nor complications per patient.   Goal of Therapy:  Heparin level: 0.3-0.5 units/mL INR 2.5-3.5 per Neuro Monitor platelets by anticoagulation protocol: Yes    Plan:  - Coumadin 7.5mg  PO today - Continue heparin gtt at 1050 units/hr - Check 6 hr HL - Daily HL / CBC / PT / INR - F/U insulin adjustment     Remy Dia D. , PharmD, BCPS Pager:  [phone] 03/12/2014, 2:09 PM

## 2014-03-12 NOTE — Progress Notes (Signed)
[facility] PROGRESS NOTE  Madison [name] IHK:[id number] DOB: 1961-01-21 DOA: 03/08/2014 PCP: Janell Quiet, FNP  Assessment/Plan: 1. CVA -CT scan of brain performed on 03/08/2014 showing right frontal lobe infarct having subacute appearance -Patient with history of aortic valve replacement, presenting with a subtherapeutic INR of 1.37. -Neurology consulted, it remains an IV heparin with Coumadin bridge. -Coumadin restarted on 03/09/2014, pharmacy consulted for Coumadin dosing  -Remains sub-therapeutic, continue Hep gtt  2. acute hypoxemic respiratory failure -Status post rapid sequence intubation, admitted to the pulmonary critical care service, extubated on 03/09/2014  3. Status post aortic valve replacement -Remains on IV heparin until Coumadin therapeutic -Pharmacy consulted for Coumadin dosing, INR goal 2.5-3.5  4. Diabetic ketoacidosis versus hyperosmolar hyperglycemic state -Resolved -Blood sugars suboptimally controlled however, with CBG's in the 300's -Changed to insulin 70/30 20 units subcutaneous twice a day on 03/11/14 -Per records patient had been on insulin 7030 40 units subcutaneous in a.m. -Continue Accu-Cheks q. a.c. and each bedtime with vascular coverage -BS seem improved  5. Gastroesophageal reflux disease -Continue PPI therapy  6. Acute kidney injury -Likely secondary to hypotension/prerenal azotemia -IV fluids discontinue   Code Status: Full code Family Communication:  Disposition Plan: Patient would likely benefit from CIR   Consultants:  PCCM  Neurology  Procedures: ETT 4/20>>>4/21  CVL Left Ij 4/20 >>>  NG tube 4/20>>>4/21   Antibiotics:   HPI/Subjective: Patient is a pleasant 53 year old female with a past medical history of aortic valve replacement with mechanical valve, presented with mental status changes in setting of hypoglycemia, DKA, imaging studies revealing an acute/subacute anterior frontal CVA. Patient was not as  treated with TPA. Admitted to the pulmonary critical care service where she underwent rapid sequence intubation. She was successfully extubated on 03/09/2014. Transferred to telemetry on 03/10/2014.  Objective: Filed Vitals:   03/12/14 0544  BP: 122/51  Pulse: 73  Temp: 98.4 F (36.9 C)  Resp: 18    Intake/Output Summary (Last 24 hours) at 03/12/14 1301 Last data filed at 03/12/14 1240  Gross per 24 hour  Intake    480 ml  Output   1051 ml  Net   -571 ml   Filed Weights   03/10/14 0353 03/11/14 0500 03/12/14 0544  Weight: 78.6 kg (173 lb 4.5 oz) 75.705 kg (166 lb 14.4 oz) 74.2 kg (163 lb 9.3 oz)    Exam:   General:  Patient is in no acute distress awake and alert  Cardiovascular: Regular rate rhythm normal S1-S2  Respiratory: Clear to auscultation bilaterally  Abdomen: Soft nontender nondistended  Musculoskeletal: No edema  Neurological: 3/5 left upper extremity and left lower extremity weakness, no facial droop or slurred speech  Data Reviewed: Basic Metabolic Panel:  Recent Labs Lab 03/09/14 1200 03/09/14 1600  03/09/14 2030 03/10/14 0350 03/10/14 1700 03/11/14 0428 03/11/14 1645 03/12/14 0503  NA 140 138  --  137 141  --  140  --   --   K 4.0 4.1  --  3.9 3.9  --  4.3  --   --   CL 110 110  --  111 111  --  107  --   --   CO2 17* 17*  --  17* 20  --  20  --   --   GLUCOSE 163* 251*  --  213* 246*  --  342*  --   --   BUN 14 11  --  9 9  --  11  --   --  CREATININE 0.95 0.85  --  0.87 0.82  --  0.74  --   --   CALCIUM 7.7* 7.7*  --  8.0* 7.8*  --  8.8  --   --   MG  --   --   < >  --  2.0 1.9 2.0 1.8 1.8  PHOS  --   --   < >  --  2.5 1.4* 2.8 2.6 2.9  < > = values in this interval not displayed. Liver Function Tests:  Recent Labs Lab 03/08/14 0831  AST 16  ALT 15  ALKPHOS 133*  BILITOT <0.2*  PROT 7.5  ALBUMIN 3.7   No results found for this basename: LIPASE, AMYLASE,  in the last 168 hours No results found for this basename: AMMONIA,  in  the last 168 hours CBC:  Recent Labs Lab 03/09/14 0415 03/09/14 1700 03/10/14 0350 03/11/14 0925 03/12/14 0503  WBC 15.9* 11.4* 9.3 6.7 4.6  HGB 10.3* 9.7* 9.3* 10.2* 10.0*  HCT 29.6* 27.6* 26.9* 30.5* 30.1*  MCV 81.3 83.4 83.0 85.0 86.0  PLT 203 146* 132* 134* 122*   Cardiac Enzymes:  Recent Labs Lab 03/08/14 1646 03/08/14 1900 03/08/14 2210 03/09/14 0416 03/09/14 0957 03/09/14 2030 03/10/14 0350  CKTOTAL  --  155  --   --   --   --   --   TROPONINI <0.30  --  0.32* 0.43* 0.52* 0.31* <0.30   BNP (last 3 results)  Recent Labs  10/23/13 1601  PROBNP 130.0*   CBG:  Recent Labs Lab 03/11/14 1128 03/11/14 1624 03/11/14 2122 03/12/14 0647 03/12/14 1102  GLUCAP 195* 202* 164* 282* 72    Recent Results (from the past 240 hour(s))  CULTURE, BLOOD (ROUTINE X 2)     Status: None   Collection Time    03/08/14  8:31 AM      Result Value Ref Range Status   Specimen Description BLOOD RIGHT ANTECUBITAL   Final   Special Requests BOTTLES DRAWN AEROBIC AND ANAEROBIC 5CCS   Final   Culture  Setup Time     Final   Value: 03/08/2014 13:30     Performed at [facility]   Culture     Final   Value:        BLOOD CULTURE RECEIVED NO GROWTH TO DATE CULTURE WILL BE HELD FOR 5 DAYS BEFORE ISSUING A FINAL NEGATIVE REPORT     Performed at [facility]   Report Status PENDING   Incomplete  CULTURE, BLOOD (ROUTINE X 2)     Status: None   Collection Time    03/08/14  9:30 AM      Result Value Ref Range Status   Specimen Description BLOOD LEFT ANTECUBITAL   Final   Special Requests BOTTLES DRAWN AEROBIC AND ANAEROBIC 10CCS   Final   Culture  Setup Time     Final   Value: 03/08/2014 13:31     Performed at [facility]   Culture     Final   Value:        BLOOD CULTURE RECEIVED NO GROWTH TO DATE CULTURE WILL BE HELD FOR 5 DAYS BEFORE ISSUING A FINAL NEGATIVE REPORT     Performed at [facility]   Report Status PENDING   Incomplete  MRSA PCR  SCREENING     Status: None   Collection Time    03/08/14 12:49 PM      Result Value Ref Range Status  MRSA by PCR NEGATIVE  NEGATIVE Final   Comment:            The GeneXpert MRSA Assay (FDA     approved for NASAL specimens     only), is one component of a     comprehensive MRSA colonization     surveillance program. It is not     intended to diagnose MRSA     infection nor to guide or     monitor treatment for     MRSA infections.  URINE CULTURE     Status: None   Collection Time    03/08/14  1:20 PM      Result Value Ref Range Status   Specimen Description URINE, CATHETERIZED   Final   Special Requests Normal   Final   Culture  Setup Time     Final   Value: 03/08/2014 17:16     Performed at [facility] Count     Final   Value: NO GROWTH     Performed at [facility]   Culture     Final   Value: NO GROWTH     Performed at [facility]   Report Status 03/09/2014 FINAL   Final     Studies: No results found.  Scheduled Meds: . aspirin  81 mg Oral Daily  . baclofen  10 mg Oral BID  . darifenacin  7.5 mg Oral Daily  . escitalopram  5 mg Oral Daily  . insulin aspart  0-15 Units Subcutaneous TID WC  . insulin aspart protamine- aspart  20 Units Subcutaneous BID WC  . pantoprazole  40 mg Oral Q1200  . ramelteon  8 mg Oral QHS  . simvastatin  10 mg Oral q1800  . sodium chloride  3 mL Intravenous Q12H  . Warfarin - Pharmacist Dosing Inpatient   Does not apply q1800   Continuous Infusions: . heparin 900 Units/hr (03/12/14 0645)    Principal Problem:   Septic shock(785.52) Active Problems:   Type I (juvenile type) diabetes mellitus with neurological manifestations, not stated as uncontrolled   DKA (diabetic ketoacidoses)   Severe sepsis(995.92)   Metabolic acidosis    Time spent: 25 min    Jeralyn Bennett  [facility] Pager [phone]. If 7PM-7AM, please contact night-coverage at [email or web address], password [email or web address] 03/12/2014,  1:01 PM  LOS: 4 days

## 2014-03-12 NOTE — Progress Notes (Signed)
Rehab admissions - I have approval for acute inpatient rehab admission for today.  I have let case Production designer, theatre/television/film and social worker know.  I will contact MD.  Bed available and with MD approval will admit to acute inpatient rehab today.  Call me for questions.  #[phone]

## 2014-03-12 NOTE — Progress Notes (Signed)
Per CIR, they are able to take patient. CSW signing off at this time.  Maree Krabbe, MSW, [facility] [phone]

## 2014-03-12 NOTE — Progress Notes (Signed)
Clinical Social Work Department CLINICAL SOCIAL WORK PLACEMENT NOTE 03/12/2014  Patient:  Madison [name], Madison [name]  Account Number:  [id number] Admit date:  03/08/2014  Clinical Social Worker:  Carren Rang  Date/time:  03/12/2014 09:30 AM  Clinical Social Work is seeking post-discharge placement for this patient at the following level of care:   SKILLED NURSING   (*CSW will update this form in Epic as items are completed)   03/12/2014  Patient/family provided with [facility] Department of Clinical Social Work's list of facilities offering this level of care within the geographic area requested by the patient (or if unable, by the patient's family).  03/12/2014  Patient/family informed of their freedom to choose among providers that offer the needed level of care, that participate in Medicare, Medicaid or managed care program needed by the patient, have an available bed and are willing to accept the patient.  03/12/2014  Patient/family informed of [facility]' ownership interest in [facility], as well as of the fact that they are under no obligation to receive care at this facility.  PASARR submitted to EDS on 03/12/2014 PASARR number received from EDS on 03/12/2014  FL2 transmitted to all facilities in geographic area requested by pt/family on  03/12/2014 FL2 transmitted to all facilities within larger geographic area on   Patient informed that his/her managed care company has contracts with or will negotiate with  certain facilities, including the following:     Patient/family informed of bed offers received:   Patient chooses bed at  Physician recommends and patient chooses bed at    Patient to be transferred to  on   Patient to be transferred to facility by   The following physician request were entered in Epic:   Additional Comments:  Maree Krabbe, MSW, [employer] [phone]

## 2014-03-12 NOTE — Progress Notes (Signed)
Rehab admissions - Evaluated for possible admission.  I met with patient yesterday.  She would like to come to inpatient rehab.  I faxed info to onsite reviewer for [facility].  We received an initial denial for acute inpatient rehab with request to fax OT eval today.  I have faxed the OT eval and await response from insurance carrier.  I will update all once I hear back from insurance case manager.  Call me for questions.  #[phone]

## 2014-03-12 NOTE — Progress Notes (Signed)
Patient ID: Madison [name], female   DOB: 01-12-1961, 53 y.o.   MRN: [id number] pt was admitted to to 4w5. Admission vitals are stable

## 2014-03-12 NOTE — Progress Notes (Signed)
Pt transferred to 4W05.  All personal belongings taken, nurse to nurse report given, and Pt oriented to new room.

## 2014-03-12 NOTE — PMR Pre-admission (Signed)
PMR Admission Coordinator Pre-Admission Assessment  Patient: Madison [name] is an 53 y.o., female MRN: [id number] DOB: 1961/09/24 Height: '5\' 8"'  (172.7 cm) Weight: 74.2 kg (163 lb 9.3 oz)              Insurance Information HMO: Yes    PPO:       PCP:       IPA:       80/20:       OTHER: Group # [id number] PRIMARY: [employer] HMO      Policy#: [id number]      Subscriber: Alanson Aly CM Name: Rema Fendt      Phone#: [phone]     Fax#: [phone] Pre-Cert#:  [id number] with follow up by onsite reviewer      Employer: Not employed Benefits:  Phone #: [phone]     Name: Zadie Cleverly. Date: 11/19/13     Deduct: $0      Out of Pocket Max: $4900 (met$0)      Life Max: unlimited CIR: $245 copay per admit     SNF: $0 days 1-20; $166 days 21-100 Outpatient: no limits     Co-Pay: $10/visit Home Health: 100%      Co-Pay: none DME: 80%     Co-Pay: 20% Providers: in network  SECONDARY: Medicaid of [place]      Policy#: [id number]      Subscriber: Alanson Aly CM Name:        Phone#:       Fax#:   Pre-Cert#:        Employer: Not employed Benefits:  Phone #: [phone]     Name: Automated Eff. 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 right-handed female with history of diabetes mellitus, hypertension, CAD with CABG as well as aortic valve replacement on chronic Coumadin. Patient was independent prior to admission with ADLs and mobility. Admitted 03/08/2014 with altered mental status, hypoglycemia and acute respiratory failure. Patient was intubated in the emergency room.  Noted blood sugar of 1124 as well as potassium 7.2. 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 INR on admission of 1.37. Echocardiogram and carotid Dopplers are pending. Critical medicine consulted for severe DKA metabolic acidosis hyperkalemia. Placed on intravenous fluids. Patient was extubated 03/09/2014. Neurology services consulted patient presently remains on Coumadin therapy with heparin bridge for secondary stroke prevention. Maintained on a dysphagia 3 thin liquid diet. Physical therapy evaluation completed 03/09/2014 with recommendations for physical medicine rehabilitation consult. Patient to be admitted for comprehensive rehabilitation program.    Past Medical History  Past Medical History  Diagnosis Date  . CAD (coronary artery disease)   . Obesity   . Hypercholesteremia   . HTN (hypertension)   . Dyslipidemia   . Stroke syndrome   . Aortic  stenosis   . Thrombophlebitis   . Diabetes   . Hyperlipidemia   . Rheumatoid arthritis(714.0)     Family History  family history includes Stroke in an other family member.  Prior Rehab/Hospitalizations:  Had [facility] therapies after previous CVA   Current Medications  Current facility-administered medications:aspirin chewable tablet 81 mg, 81 mg, Oral, Daily, Donzetta Starch, NP, 81 mg at 03/12/14 1011;  baclofen (LIORESAL) tablet 10 mg, 10 mg, Oral, BID, Kelvin Cellar, MD, 10 mg at 03/12/14 1011;  darifenacin (ENABLEX) 24 hr tablet 7.5 mg, 7.5 mg, Oral, Daily, Kelvin Cellar, MD, 7.5 mg at 03/12/14 1011;  dextrose 50 % solution 25 mL, 25 mL, Intravenous, PRN, Ivor Costa, MD escitalopram (LEXAPRO) tablet 5 mg, 5 mg, Oral, Daily, Kelvin Cellar, MD, 5 mg at 03/12/14 1011;  heparin ADULT infusion 100 units/mL (25000 units/250 mL), 900 Units/hr, Intravenous, Continuous, Veronda Francee Nodal, [facility], Last Rate: 9 mL/hr at 03/12/14 0645, 900 Units/hr at 03/12/14 0645;  insulin  aspart (novoLOG) injection 0-15 Units, 0-15 Units, Subcutaneous, TID WC, Marjan Rabbani, MD, 8 Units at 03/12/14 0647 insulin aspart protamine- aspart (NOVOLOG MIX 70/30) injection 20 Units, 20 Units, Subcutaneous, BID WC, Kelvin Cellar, MD, 20 Units at 03/12/14 0755;  pantoprazole (PROTONIX) EC tablet 40 mg, 40 mg, Oral, Q1200, Kelvin Cellar, MD, 40 mg at 03/12/14 1218;  ramelteon (ROZEREM) tablet 8 mg, 8 mg, Oral, QHS, Jones Bales, MD, 8 mg at 03/11/14 2130;  simvastatin (ZOCOR) tablet 10 mg, 10 mg, Oral, q1800, Donzetta Starch, NP, 10 mg at 03/11/14 1724 sodium chloride 0.9 % injection 10-40 mL, 10-40 mL, Intracatheter, PRN, Kelvin Cellar, MD;  sodium chloride 0.9 % injection 3 mL, 3 mL, Intravenous, Q12H, Ivor Costa, MD, 3 mL at 03/12/14 1000;  Warfarin - Pharmacist Dosing Inpatient, , Does not apply, q1800, Ivor Costa, MD  Patients Current Diet: Carb Control  Precautions / Restrictions Precautions Precautions: Fall Restrictions Weight Bearing Restrictions: No   Prior Activity Level Community (5-7x/wk): Goes to grocery store, to church, and to son's therapy appt every Tuesday.  Sister does the driving.  Home Assistive Devices / Equipment Home Assistive Devices/Equipment: Cane (specify quad or straight) Home Equipment: Cane - single point (grab bar on tub)  Prior Functional Level Prior Function Level of Independence: Independent;Needs assistance ADL's / Homemaking Assistance Needed: aid drives to appointments, cleans house, cooks by using microwave meals Comments: Pt has button pull and rocker knife at home to help with fine motor deficits. Pt wants to return to this level of independence to take of 53 yo son  Current Functional Level Cognition  Overall Cognitive Status: Impaired/Different from baseline Current Attention Level: Selective Orientation Level: Oriented X4    Extremity Assessment (includes Sensation/Coordination)          ADLs  Overall ADL's : Needs  assistance/impaired Grooming: Set up;Standing;Cueing for sequencing Grooming Details (indicate cue type and reason): requires extended time and OT to loosen tooth paste lid to open and apply paste Toilet Transfer: Minimal assistance;Cueing for sequencing;Regular Toilet;Grab bars Toilet Transfer Details (indicate cue type and reason): UE use to control descend Toileting- Clothing Manipulation and Hygiene: Set up;Sitting/lateral lean Functional mobility during ADLs: Minimal assistance General ADL Comments: pt demonstrates LT UE deficits and decr gait velocity . pt with decr balance and requires UE support for ambulation and dynamic standing.    Mobility  Overal bed mobility: Needs Assistance Bed Mobility: Supine to Sit;Sit to Supine Supine to sit: Min guard;HOB elevated Sit to supine: Min  assist General bed mobility comments: in chair    Transfers  Overall transfer level: Needs assistance Equipment used: 1 person hand held assist Transfers: Sit to/from Stand Sit to Stand: Min assist General transfer comment: requires BIL UE use     Ambulation / Gait / Stairs / Wheelchair Mobility  Ambulation/Gait Ambulation/Gait assistance: Min guard;Min assist Ambulation Distance (Feet): 350 Feet Assistive device: 1 person hand held assist;None Gait Pattern/deviations: Step-to pattern;Decreased stride length;Decreased step length - left;Drifts right/left Gait velocity interpretation: Below normal speed for age/gender General Gait Details: Pt drifts left when ambulating and occasionally bumped into wall and objects on left.  Pt did not lose balance and was not using a device during large portion of ambulation. When challenged, pt does lose balance and needs asssist to maintain stability.  Upon further questioning, pt has to care for 51 year old son.  Will need to be fully independent as he has ADHD and she assists himwith bathing and brushing his teeth prior to him catching bus in the ams.  Recently had  aide start assisting pt in the home per pt.      Posture / Balance High Level Balance High Level Balance Comments: decr dynamic standing balance with ADLS. pt leaning on counter surface for support    Special needs/care consideration BiPAP/CPAP No CPM No Continuous Drip IV Heparin drip as of 03/12/14  Dialysis No       Life Vest No Oxygen No Special Bed No Trach Size No Wound Vac (area)No      Skin Bruising around L neck                              Bowel mgmt: Last BM 03/12/14 Bladder mgmt: Using BSC with assistance, some urinary urgency, some incontinence during coughing/sneezing Diabetic mgmt Yes, on insulin at home    Previous Home Environment Living Arrangements: Children Available Help at Discharge: Personal care attendant Type of Home: House Home Layout: One level Home Access: Level entry Bathroom Shower/Tub: Chiropodist: Standard Home Care Services: Yes Type of Home Care Services: Homehealth aide (80 hrs per wk ) Additional Comments: sister can come and stay with pt.  Son with ADHD and pt has to care for him and get him on the bus.  Sister supportive per pt.  Discharge Living Setting Plans for Discharge Living Setting: Patient's home;Lives with (comment);Apartment Type of Home at Discharge: Apartment Discharge Home Layout: One level Discharge Home Access: Stairs to enter Entrance Stairs-Number of Steps: 9-10 steps down to bottom floor apartment.  Laundry on bottom level as well. Does the patient have any problems obtaining your medications?: No  Social/Family/Support Systems Patient Roles: Parent (Has a 16 yr old son and a 62 yr old son.) Contact Information: [name] - sister  [phone] Anticipated Caregiver: self and 84 yo son in June. Ability/Limitations of Caregiver: Sister is local.  Sister currently keeping 53 yo son.  Sister drives for patient.  Son is adopted and has severe ADHD.   Caregiver Availability: Intermittent.  Has a long  time family friend that helps with repairs/groceries.  Has an aide who comes 7 days a week, 80 hrs/month.  Aide comes in at 6 am and stays all day.  Aide does household chores.  Patient has trouble opening up containers due to impaired left hand.  Also neighbors are supportive. Discharge Plan Discussed with Primary Caregiver: Yes Is Caregiver In Agreement with Plan?: Yes  Does Caregiver/Family have Issues with Lodging/Transportation while Pt is in Rehab?: No  Goals/Additional Needs Patient/Family Goal for Rehab: PT/OT/ST mod I goals Expected length of stay: 10-14 days Cultural Considerations: Attends church Dietary Needs: Carb mod med cal, thin liquids Equipment Needs: TBD Additional Information: Received Medicaid in 2010 after divorce after moving here from [place], [place]. Pt/Family Agrees to Admission and willing to participate: Yes Program Orientation Provided & Reviewed with Pt/Caregiver Including Roles  & Responsibilities: Yes  Decrease burden of Care through IP rehab admission: N/A  Possible need for SNF placement upon discharge: Not anticipated  Patient Condition: This patient's medical and functional status has changed since the consult dated: 03/10/14 in which the Rehabilitation Physician determined and documented that the patient's condition is appropriate for intensive rehabilitative care in an inpatient rehabilitation facility. See "History of Present Illness" (above) for medical update. Functional changes are: Currently requiring min to minguard assist to amb 350 feet +1 HHA. Patient's medical and functional status update has been discussed with the Rehabilitation physician and patient remains appropriate for inpatient rehabilitation. Will admit to inpatient rehab today.  Preadmission Screen Completed By:  Retta Diones, 03/12/2014 1:51 PM ______________________________________________________________________   Discussed status with Dr. Naaman Plummer on 03/12/14 at 1414 and received  telephone approval for admission today.  Admission Coordinator:  Retta Diones, time1414/Date04/24/15

## 2014-03-12 NOTE — Progress Notes (Signed)
Clinical Social Work Department BRIEF PSYCHOSOCIAL ASSESSMENT 03/12/2014  Patient:  Madison [name], Madison [name]     Account Number:  [id number]     Admit date:  03/08/2014  Clinical Social Worker:  Carren Rang  Date/Time:  03/12/2014 09:23 AM  Referred by:  RN  Date Referred:  03/12/2014 Referred for  SNF Placement   Other Referral:   Interview type:  Patient Other interview type:    PSYCHOSOCIAL DATA Living Status:  FAMILY Admitted from facility:   Level of care:   Primary support name:  Madison [name] Primary support relationship to patient:  SIBLING Degree of support available:   Good    CURRENT CONCERNS Current Concerns  Post-Acute Placement   Other Concerns:    SOCIAL WORK ASSESSMENT / PLAN Per CIR, patient is being looked at but may be denied. CSW introduced self and explained reason for visit. CSW explained SNF process to patient. Patient reported she wants CIR and would rather not go to a SNF, but would go to SNF for a week or so if she had to. Patient states she will have to talk to her sister who is watching her son, because she is unsure that her son will have a plan..   CSW explained that social worker will review chart and follow along to see if CIR is going to be able to take patient. Patient states that was fine .CSW will complete FL2 for MD's signature and will update patient when bed offers are received.   Assessment/plan status:  Psychosocial Support/Ongoing Assessment of Needs Other assessment/ plan:   Information/referral to community resources:   SNF information    PATIENT'S/FAMILY'S RESPONSE TO PLAN OF CARE: Patient states she is agreeable to SNF if she gets denial from CIR.       Madison [name], MSW, Theresia Majors [phone]

## 2014-03-12 NOTE — Evaluation (Signed)
Occupational Therapy Evaluation Patient Details Name: Madison [name] MRN: [id number] DOB: [birth year] Today's Date: 03/12/2014    History of Present Illness pt presents with DKA, Encephalopathy, Anterior Frontal Ischemic Infarct, with hx of R MCA Infarct.     Clinical Impression   PT admitted with DKA encephalopathy and CVA anterior frontal ischemic infarct. Pt currently with functional limitiations due to the deficits listed below (see OT problem list). PTA pt was home with 78 yo son mod I with caregiver providing assistance only for community mobility ( doctor appointments, drive to grocery store, meal prep)  Pt will benefit from skilled OT to increase their independence and safety with adls and balance to allow discharge CIR. Pt demonstrates cognitive deficits (sequencing/ problem solving) and balance deficits (dynamic standing balance for adls). Pt could benefit greatly from a CIR admission to reach MOD I. Pt's 72 yo son is coming to live with her and currently does not have employment so could provide 24/7 (A)     Follow Up Recommendations  CIR    Equipment Recommendations       Recommendations for Other Services Rehab consult     Precautions / Restrictions Precautions Precautions: Fall Restrictions Weight Bearing Restrictions: No      Mobility Bed Mobility                  Transfers Overall transfer level: Needs assistance Equipment used: 1 person hand held assist Transfers: Sit to/from Stand Sit to Stand: Min assist         General transfer comment: requires BIL UE use     Balance Overall balance assessment: Needs assistance                             High Level Balance Comments: decr dynamic standing balance with ADLS. pt leaning on counter surface for support            ADL Overall ADL's : Needs assistance/impaired     Grooming: Set up;Standing;Cueing for sequencing Grooming Details (indicate cue type and reason): requires extended  time and OT to loosen tooth paste lid to open and apply paste                 Toilet Transfer: Minimal assistance;Cueing for sequencing;Regular Toilet;Grab bars Toilet Transfer Details (indicate cue type and reason): UE use to control descend Toileting- Clothing Manipulation and Hygiene: Set up;Sitting/lateral lean       Functional mobility during ADLs: Minimal assistance General ADL Comments: pt demonstrates LT UE deficits and decr gait velocity . pt with decr balance and requires UE support for ambulation and dynamic standing.     Vision                     Perception Perception Perception Tested?: No   Praxis Praxis Praxis tested?: Deficits Deficits: Organization    Pertinent Vitals/Pain None reported at this time Coughing due to eating bacon- oral care provided and coughing resolved. Question ability to swallow bacon safely at this time     Hand Dominance Right   Extremity/Trunk Assessment Upper Extremity Assessment Upper Extremity Assessment: LUE deficits/detail LUE Deficits / Details: ataxic movement, unable to complete pad to pad, decr gross motor with elbow flexion , AROM shoulder flexion ~80 degrees LUE Coordination: decreased fine motor;decreased gross motor   Lower Extremity Assessment Lower Extremity Assessment: Defer to PT evaluation   Cervical / Trunk Assessment Cervical / Trunk Assessment: Normal  Communication Communication Communication: Expressive difficulties   Cognition Arousal/Alertness: Awake/alert Behavior During Therapy: WFL for tasks assessed/performed Overall Cognitive Status: Impaired/Different from baseline Area of Impairment: Attention;Memory;Awareness;Problem solving   Current Attention Level: Selective Memory: Decreased short-term memory     Awareness: Emergent Problem Solving: Slow processing;Difficulty sequencing     General Comments       Exercises       Shoulder Instructions      Home Living  Family/patient expects to be discharged to:: Inpatient rehab Living Arrangements: Children Available Help at Discharge: Personal care attendant Type of Home: House Home Access: Level entry     Home Layout: One level     Bathroom Shower/Tub: Chief Strategy Officer: Standard     Home Equipment: Cane - single point (grab bar on tub)   Additional Comments: sister can come and stay with pt.  Son with ADHD and pt has to care for him and get him on the bus.  Sister supportive per pt.      Prior Functioning/Environment Level of Independence: Independent;Needs assistance    ADL's / Homemaking Assistance Needed: aid drives to appointments, cleans house, cooks by using microwave meals   Comments: Pt has button pull and rocker knife at home to help with fine motor deficits. Pt wants to return to this level of independence to take of 59 yo son    OT Diagnosis: Generalized weakness;Cognitive deficits;Hemiplegia non-dominant side;Ataxia   OT Problem List: Decreased strength;Decreased activity tolerance;Impaired balance (sitting and/or standing);Decreased safety awareness;Decreased knowledge of use of DME or AE;Decreased knowledge of precautions;Impaired UE functional use;Impaired tone;Decreased cognition;Decreased coordination   OT Treatment/Interventions: Self-care/ADL training;Therapeutic exercise;Neuromuscular education;DME and/or AE instruction;Therapeutic activities;Cognitive remediation/compensation;Patient/family education;Balance training    OT Goals(Current goals can be found in the care plan section) Acute Rehab OT Goals Patient Stated Goal: to get home to take care of son OT Goal Formulation: With patient Time For Goal Achievement: 03/26/14 Potential to Achieve Goals: Good  OT Frequency: Min 2X/week   Barriers to D/C:            Co-evaluation              End of Session Nurse Communication: Mobility status;Precautions  Activity Tolerance: Patient  tolerated treatment well Patient left: in chair;with call bell/phone within reach   Time: 0915-0941 OT Time Calculation (min): 26 min Charges:  OT General Charges $OT Visit: 1 Procedure OT Evaluation $Initial OT Evaluation Tier I: 1 Procedure OT Treatments $Self Care/Home Management : 8-22 mins G-Codes:    Harolyn Rutherford 2014/03/30, 10:34 AM Pager: [phone]

## 2014-03-13 ENCOUNTER — Inpatient Hospital Stay (HOSPITAL_COMMUNITY): Payer: Medicare HMO | Admitting: Speech Pathology

## 2014-03-13 ENCOUNTER — Inpatient Hospital Stay (HOSPITAL_COMMUNITY): Payer: Commercial Managed Care - HMO | Admitting: Occupational Therapy

## 2014-03-13 ENCOUNTER — Inpatient Hospital Stay (HOSPITAL_COMMUNITY): Payer: Medicare HMO | Admitting: Physical Therapy

## 2014-03-13 LAB — CBC
HEMATOCRIT: 30 % — AB (ref 36.0–46.0)
HEMOGLOBIN: 10 g/dL — AB (ref 12.0–15.0)
MCH: 28.3 pg (ref 26.0–34.0)
MCHC: 33.3 g/dL (ref 30.0–36.0)
MCV: 85 fL (ref 78.0–100.0)
Platelets: 167 10*3/uL (ref 150–400)
RBC: 3.53 MIL/uL — ABNORMAL LOW (ref 3.87–5.11)
RDW: 13.6 % (ref 11.5–15.5)
WBC: 6.7 10*3/uL (ref 4.0–10.5)

## 2014-03-13 LAB — PROTIME-INR
INR: 1.53 — AB (ref 0.00–1.49)
Prothrombin Time: 18 seconds — ABNORMAL HIGH (ref 11.6–15.2)

## 2014-03-13 LAB — GLUCOSE, CAPILLARY
GLUCOSE-CAPILLARY: 157 mg/dL — AB (ref 70–99)
Glucose-Capillary: 103 mg/dL — ABNORMAL HIGH (ref 70–99)
Glucose-Capillary: 174 mg/dL — ABNORMAL HIGH (ref 70–99)
Glucose-Capillary: 75 mg/dL (ref 70–99)

## 2014-03-13 LAB — HEPARIN LEVEL (UNFRACTIONATED)
HEPARIN UNFRACTIONATED: 0.42 [IU]/mL (ref 0.30–0.70)
Heparin Unfractionated: 0.39 IU/mL (ref 0.30–0.70)

## 2014-03-13 MED ORDER — SODIUM CHLORIDE 0.9 % IJ SOLN
10.0000 mL | INTRAMUSCULAR | Status: DC | PRN
  Administered 2014-03-15 (×2): 10 mL
  Administered 2014-03-16: 20 mL
  Administered 2014-03-17: 10 mL
  Administered 2014-03-17: 20 mL
  Administered 2014-03-18 (×2): 10 mL
  Administered 2014-03-19: 20 mL
  Administered 2014-03-21: 30 mL

## 2014-03-13 MED ORDER — WARFARIN SODIUM 7.5 MG PO TABS
7.5000 mg | ORAL_TABLET | Freq: Once | ORAL | Status: AC
  Administered 2014-03-13: 7.5 mg via ORAL
  Filled 2014-03-13: qty 1

## 2014-03-13 NOTE — Progress Notes (Addendum)
ANTICOAGULATION CONSULT NOTE - Follow Up Consult  Pharmacy Consult:  Heparin / Coumadin Indication:  AVR  Allergies  Allergen Reactions  . Celebrex [Celecoxib] Rash  . Detrol [Tolterodine]     Patient Measurements: Height: 5\' 3"  (160 cm) Weight: 156 lb 8 oz (70.988 kg) IBW/kg (Calculated) : 52.4 Heparin Dosing Weight: 79 kg  Vital Signs: Temp: 97.4 F (36.3 C) (04/25 0518) Temp src: Oral (04/25 0518) BP: 124/73 mmHg (04/25 0518) Pulse Rate: 75 (04/25 0518)  Labs:  Recent Labs  03/11/14 0428 03/11/14 0925 03/12/14 0503 03/12/14 1230 03/13/14 0625  HGB  --  10.2* 10.0*  --   --   HCT  --  30.5* 30.1*  --   --   PLT  --  134* 122*  --   --   LABPROT 17.2*  --  16.1*  --  18.0*  INR 1.44  --  1.32  --  1.53*  HEPARINUNFRC 0.41  --  0.18* 0.14* 0.39  CREATININE 0.74  --   --   --   --     Estimated Creatinine Clearance: 76.8 ml/min (by C-G formula based on Cr of 0.74).     Assessment: 36 YOF s/p CVA and NSTEMI to continue on Coumadin and IV heparin bridge for history of AVR.  INR trended up to 1.53 this AM on increased dose but remains sub-therapeutic (on Coumadin 5mg  PO daily at home).  Heparin level is therapeutic this AM but on lower end of goal range.  No bleeding nor complications per patient. Will order CBC for later today.    Goal of Therapy:  Heparin level: 0.3-0.5 units/mL INR 2.5-3.5 per Neuro Monitor platelets by anticoagulation protocol: Yes    Plan:  - Repeat Coumadin 7.5mg  PO today - Increase heparin gtt to 1100 units/hr - Check 6 hr HL - Daily HL / CBC / PT / INR - F/U insulin adjustment  - F/U CBC today     40, PharmD.  Clinical Pharmacist Pager [phone]     Addendum: Repeat HL was therapeutic at 0.42. Plt trended up to 167. Continue at the current heparin rate and f/u daily AM HL.   Vinnie Level, PharmD.  Clinical Pharmacist Pager [phone]

## 2014-03-13 NOTE — Evaluation (Signed)
Occupational Therapy Assessment and Plan  Patient Details  Name: Jacelyn Cuen MRN: [id number] Date of Birth: 01/05/1961  OT Diagnosis: abnormal posture, cognitive deficits, hemiplegia affecting non-dominant side and muscle weakness (generalized) Rehab Potential: Rehab Potential: Good ELOS: 7 days   Today's Date: 03/13/2014 Time: 0900-1005 Time Calculation (min): 65 min  Problem List:  Patient Active Problem List   Diagnosis Date Noted  . AKI (acute kidney injury) 03/12/2014  . CVA (cerebral infarction) 03/12/2014  . Acute respiratory failure 03/12/2014  . DKA (diabetic ketoacidoses) 03/08/2014  . Septic shock(785.52) 03/08/2014  . Severe sepsis(995.92) 03/08/2014  . Metabolic acidosis 00/86/7619  . Encounter for therapeutic drug monitoring 12/31/2013  . Pain in joint, ankle and foot 09/30/2013  . Type I (juvenile type) diabetes mellitus with neurological manifestations, not stated as uncontrolled 07/31/2013  . Chronic anticoagulation 07/28/2013  . S/P AVR 07/28/2013  . Long term (current) use of anticoagulants 07/28/2013  . Aortic valve disorders 07/28/2013  . CAD (coronary artery disease)   . Obesity   . Hypercholesteremia   . HTN (hypertension)   . Dyslipidemia   . Stroke syndrome   . Aortic stenosis   . Thrombophlebitis   . Diabetes   . Hyperlipidemia   . Rheumatoid arthritis(714.0)     Past Medical History:  Past Medical History  Diagnosis Date  . CAD (coronary artery disease)   . Obesity   . Hypercholesteremia   . HTN (hypertension)   . Dyslipidemia   . Stroke syndrome   . Aortic stenosis   . Thrombophlebitis   . Diabetes   . Hyperlipidemia   . Rheumatoid arthritis(714.0)    Past Surgical History:  Past Surgical History  Procedure Laterality Date  . Cesarean section    . Foot fracture surgery    . Knee surgery    . Neuroplasty / transposition median nerve at carpal tunnel      Assessment & Plan Clinical Impression: Patient is a 53 y.o. year old  female with recent admission to the hospital on 03/08/2014 with altered mental status, hypoglycemia and acute respiratory failure. Patient was intubated in the emergency room. Noted blood sugar of 1124 as well as potassium 7.2. 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 Patient transferred to CIR on 03/12/2014 .    Patient currently requires min with basic self-care skills secondary to muscle weakness, impaired timing and sequencing, abnormal tone and decreased coordination, field cut, decreased awareness and decreased memory and decreased standing balance and hemiplegia.  Prior to hospitalization, patient could complete ADLs with modified independent .  Patient will benefit from skilled intervention to decrease level of assist with basic self-care skills and increase independence with basic self-care skills prior to discharge home independently.  Anticipate patient will require intermittent supervision and follow up home health.  OT - End of Session Activity Tolerance: Tolerates 30+ min activity with multiple rests Endurance Deficit: Yes OT Assessment Rehab Potential: Good Barriers to Discharge: Decreased caregiver support Barriers to Discharge Comments: pt lives with 53yrold son OT Patient demonstrates impairments in the following area(s): Balance;Cognition;Motor;Endurance;Safety;Sensory;Vision OT Basic ADL's Functional Problem(s): Eating;Grooming;Bathing;Dressing;Toileting OT Advanced ADL's Functional Problem(s): Simple Meal Preparation OT Transfers Functional Problem(s): Toilet;Tub/Shower OT Additional Impairment(s): Fuctional Use of Upper Extremity OT Plan OT Intensity: Minimum of 1-2 x/day, 45 to 90 minutes OT Frequency: 5 out of 7 days OT Duration/Estimated Length of Stay: 7 days OT Treatment/Interventions: Balance/vestibular training;Cognitive remediation/compensation;DME/adaptive equipment instruction;Neuromuscular  re-education;Self Care/advanced ADL  retraining;Therapeutic Exercise;UE/LE Strength taining/ROM;UE/LE Coordination activities;Patient/family education;Functional electrical stimulation;Community reintegration;Discharge planning;Functional mobility training;Psychosocial support;Therapeutic Activities;Visual/perceptual remediation/compensation OT Self Feeding Anticipated Outcome(s): modified independent OT Basic Self-Care Anticipated Outcome(s): modified independent OT Toileting Anticipated Outcome(s): modified independent OT Bathroom Transfers Anticipated Outcome(s): modified independent OT Recommendation Patient destination: Home Follow Up Recommendations: Home health OT Equipment Recommended: Tub/shower seat   Skilled Therapeutic Intervention   OT Evaluation Precautions/Restrictions  Precautions Precautions: Fall Restrictions Weight Bearing Restrictions: No  Pain Pain Assessment Pain Assessment: No/denies pain Home Living/Prior Functioning Home Living Family/patient expects to be discharged to:: Private residence Living Arrangements: Children Available Help at Discharge: Personal care attendant (80 hours per month for aide, sister lives a couple of miles away) Type of Home: Apartment Home Access: Stairs to enter [employer] of Steps: 6 or 7 steps  Entrance Stairs-Rails: Can reach both Home Layout: One level;Full bath on main level  Lives With: Family IADL History Homemaking Responsibilities: No Current License: No Mode of Transportation: Family;Friends Occupation: On disability Prior Function Level of Independence: Independent with transfers;Requires assistive device for independence;Needs assistance with homemaking  Able to Take Stairs?: Yes Driving: No (uses public transport and has supportive sister) Vocation: On disability (6 previous strokes) ADL  See FIM scale for details  Vision/Perception  Vision- History Baseline Vision/History: Wears  glasses Wears Glasses: At all times Patient Visual Report: Other (comment) (Pt reports needing glasses and having an appointment soom) Vision- Assessment Vision Assessment?: Yes Eye Alignment: Within Functional Limits Ocular Range of Motion: Within Functional Limits Alignment/Gaze Preference: Within Defined Limits Tracking/Visual Pursuits: Able to track stimulus in all quads without difficulty Saccades: Decreased speed of saccadic movement Convergence: Within functional limits Visual Fields: Left visual field deficit;Right visual field deficit Additional Comments: Pt with increased peripheral vision loss bilaterally but more impaired in the right visual field.  Pt reports visual field deficits from previous CVA.  Cognition Overall Cognitive Status: Impaired/Different from baseline Arousal/Alertness: Awake/alert Orientation Level: Oriented to person;Oriented to place;Oriented to situation;Oriented to time Attention: Selective Selective Attention: Appears intact Memory: Appears intact Awareness: Impaired Awareness Impairment: Intellectual impairment Safety/Judgment: Appears intact Comments: Pt stating she does not notice any deficits from this CVA that weren't present from previous CVA.   Sensation Sensation Light Touch: Appears Intact Stereognosis: Not tested Hot/Cold: Not tested Proprioception: Impaired Detail Proprioception Impaired Details: Impaired LUE Additional Comments: Pt with decreased proprioception in the left hand compared to the right. Coordination Gross Motor Movements are Fluid and Coordinated: No Fine Motor Movements are Fluid and Coordinated: No Coordination and Movement Description: Pt with history of left hemiparesis from previous CVA.  currently Brunnstrum stage V movement in the hand and arm.  Slight synergy pattern noted.  Decreased ability to perform FM tasks.  Heel Shin Test: impaired L heel on R Motor  Motor Motor: Abnormal postural alignment and  control;Hemiplegia Motor - Skilled Clinical Observations: Pt with mild right hemiparesis in the UE and LE. Mobility  Bed Mobility Bed Mobility: Sit to Supine Rolling Right: 5: Supervision;With rail Rolling Left: 5: Supervision;With rail Left Sidelying to Sit: With rails;5: Supervision;HOB flat Sitting - Scoot to Edge of Bed: 4: Min guard;With rail Sit to Supine: 4: Min guard;HOB flat Scooting to HOB: 5: Supervision;With rail Transfers Transfers: Sit to Stand;Stand to Sit Sit to Stand: 4: Min assist;With upper extremity assist;From toilet Sit to Stand Details: Verbal cues for technique;Manual facilitation for weight shifting Stand to Sit: 4: Min assist;With upper extremity assist;To toilet;To chair/3-in-1;To bed Stand to Sit Details (indicate cue type  and reason): Manual facilitation for weight shifting;Verbal cues for precautions/safety  Trunk/Postural Assessment  Cervical Assessment Cervical Assessment: Within Functional Limits Thoracic Assessment Thoracic Assessment: Within Functional Limits Lumbar Assessment Lumbar Assessment: Within Functional Limits Postural Control Postural Control: Deficits on evaluation Postural Limitations: Pt with slight increased trunk elongation in the left side.  Balance Balance Balance Assessed: Yes Dynamic Sitting Balance Dynamic Sitting - Balance Support: No upper extremity supported;Feet supported Dynamic Sitting - Level of Assistance: 5: Stand by assistance Static Standing Balance Static Standing - Balance Support: No upper extremity supported Static Standing - Level of Assistance: 5: Stand by assistance Dynamic Standing Balance Dynamic Standing - Balance Support: No upper extremity supported Dynamic Standing - Level of Assistance: 4: Min assist Extremity/Trunk Assessment RUE Assessment RUE Assessment: Exceptions to [facility] RUE Strength RUE Overall Strength Comments: Pt is currently at a Brunnstrum stage V movement in the RUE and hand.  Slight  increased flexor and extensor tone noted with elbow flexion and extension as well as with digit extension.  She uses functionallyn as an active assist for bathing and dressing but demonstrates decreased isolation of movements as well as decreased FM control.  she is able to oppose the thumb to all digits except for the 5th. LUE Assessment LUE Assessment: Within Functional Limits  FIM:  FIM - Grooming Grooming Steps: Wash, rinse, dry face;Wash, rinse, dry hands;Brush, comb hair Grooming: 5: Set-up assist to apply toothpaste FIM - Bathing Bathing Steps Patient Completed: Chest;Right Arm;Left Arm;Abdomen;Front perineal area;Buttocks;Right upper leg;Right lower leg (including foot);Left lower leg (including foot) Bathing: 4: Steadying assist FIM - Upper Body Dressing/Undressing Upper body dressing/undressing: 0: Wears gown/pajamas-no public clothing FIM - Lower Body Dressing/Undressing Lower body dressing/undressing: 5: Set-up assist to: Obtain clothing (donning gripper socks only) FIM - Toileting Toileting steps completed by patient: Adjust clothing prior to toileting;Performs perineal hygiene;Adjust clothing after toileting Toileting: 4: Steadying assist FIM - Control and instrumentation engineer Devices: Bed rails Bed/Chair Transfer: 4: Chair or W/C > Bed: Min A (steadying Pt. > 75%) FIM - Radio producer Devices: Elevated toilet seat;Grab bars Toilet Transfers: 4-To toilet/BSC: Min A (steadying Pt. > 75%);4-From toilet/BSC: Min A (steadying Pt. > 75%) FIM - Tub/Shower Transfers Tub/shower Transfers: 0-Activity did not occur or was simulated   Refer to Care Plan for Long Term Goals  Recommendations for other services: None  Discharge Criteria: Patient will be discharged from OT if patient refuses treatment 3 consecutive times without medical reason, if treatment goals not met, if there is a change in medical status, if patient makes no progress  towards goals or if patient is discharged from hospital.  The above assessment, treatment plan, treatment alternatives and goals were discussed and mutually agreed upon: by patient Began education on selfcare retraining, functional transfers, safety, and balance during session.  Pt overall min assist to min guard assist.  She states that most of her impairments are from the previous CVAs she experienced and not really any secondary to this one.  Question her awareness of deficits.  Cindra Presume OTR/L 03/13/2014, 10:27 AM

## 2014-03-13 NOTE — Evaluation (Signed)
Physical Therapy Assessment and Plan  Patient Details  Name: Madison [name] MRN: [id number] Date of Birth: [birth year]  PT Diagnosis: Abnormal posture, Abnormality of gait, Hemiparesis non-dominant and Muscle weakness Rehab Potential: Good ELOS: 7 days   Today's Date: 03/13/2014 Time: 2330-0762 Total Time: 60 minutes    Problem List:  Patient Active Problem List   Diagnosis Date Noted  . AKI (acute kidney injury) 03/12/2014  . CVA (cerebral infarction) 03/12/2014  . Acute respiratory failure 03/12/2014  . DKA (diabetic ketoacidoses) 03/08/2014  . Septic shock(785.52) 03/08/2014  . Severe sepsis(995.92) 03/08/2014  . Metabolic acidosis 26/33/3545  . Encounter for therapeutic drug monitoring 12/31/2013  . Pain in joint, ankle and foot 09/30/2013  . Type I (juvenile type) diabetes mellitus with neurological manifestations, not stated as uncontrolled 07/31/2013  . Chronic anticoagulation 07/28/2013  . S/P AVR 07/28/2013  . Long term (current) use of anticoagulants 07/28/2013  . Aortic valve disorders 07/28/2013  . CAD (coronary artery disease)   . Obesity   . Hypercholesteremia   . HTN (hypertension)   . Dyslipidemia   . Stroke syndrome   . Aortic stenosis   . Thrombophlebitis   . Diabetes   . Hyperlipidemia   . Rheumatoid arthritis(714.0)     Past Medical History:  Past Medical History  Diagnosis Date  . CAD (coronary artery disease)   . Obesity   . Hypercholesteremia   . HTN (hypertension)   . Dyslipidemia   . Stroke syndrome   . Aortic stenosis   . Thrombophlebitis   . Diabetes   . Hyperlipidemia   . Rheumatoid arthritis(714.0)    Past Surgical History:  Past Surgical History  Procedure Laterality Date  . Cesarean section    . Foot fracture surgery    . Knee surgery    . Neuroplasty / transposition median nerve at carpal tunnel      Assessment & Plan Clinical Impression: Madison [name] is a 53 y.o. right-handed female with history of diabetes mellitus,  hypertension, CAD with CABG as well as aortic valve replacement on chronic Coumadin. Patient was independent prior to admission with ADLs and mobility. Admitted 03/08/2014 with altered mental status, hypoglycemia and acute respiratory failure. Patient was intubated in the emergency room. Noted blood sugar of 1124 as well as potassium 7.2. 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 INR on admission of 1.37. Echocardiogram and carotid Dopplers are pending. Critical medicine consulted for severe DKA metabolic acidosis hyperkalemia. Placed on intravenous fluids. Patient was extubated 03/09/2014. Neurology services consulted patient presently remains on Coumadin therapy with heparin bridge for secondary stroke prevention. Maintained on a dysphagia 3 thin liquid diet. Physical therapy evaluation completed 03/09/2014 with recommendations for physical medicine rehabilitation consult.   Patient transferred to CIR on 03/12/2014 .   Patient currently requires min with mobility secondary to unbalanced muscle activation.  Prior to hospitalization, patient was modified independent  with mobility and lived with Son in a [place] home.  Home access is 6 or 7 steps Stairs to enter.  Patient will benefit from skilled PT intervention to maximize safe functional mobility, minimize fall risk and decrease caregiver burden for planned discharge home with 24 hour supervision.  Anticipate patient will benefit from follow up [place] at discharge.  PT Evaluation Precautions/Restrictions Precautions Precautions: Fall Restrictions Weight Bearing Restrictions: No General Chart Reviewed: Yes Family/Caregiver Present: No  Vital SignsTherapy Vitals Temp: 97.4 F (36.3 C) Temp src: Oral Pulse Rate: 75  Resp: 19 BP: 124/73 mmHg Patient Position, if appropriate: Lying Oxygen Therapy SpO2: 97 % O2 Device: None (Room air) Pain Pain Assessment Pain Assessment:  No/denies pain Home Living/Prior Functioning Home Living Available Help at Discharge: Personal care attendant (80 hours per month for aide, sister lives a couple of miles away) Type of Home: Apartment Home Access: Stairs to enter [employer] of Steps: 6 or 7 steps  Entrance Stairs-Rails: Can reach both Home Layout: One level;Full bath on main level  Lives With: Family Prior Function Level of Independence: Independent with transfers;Requires assistive device for independence;Needs assistance with homemaking  Able to Take Stairs?: Yes Driving: No (uses public transport and has supportive sister) Vocation: On disability (6 previous strokes) Vision/Perception  Cognition Overall Cognitive Status: Impaired/Different from baseline Orientation Level: Oriented X4 Sensation Sensation Light Touch: Appears Intact Coordination Gross Motor Movements are Fluid and Coordinated: No Fine Motor Movements are Fluid and Coordinated: No Heel Shin Test: impaired L heel on R Motor  Motor Motor: Hemiplegia  Mobility Bed Mobility Bed Mobility: Rolling Right;Rolling Left;Left Sidelying to Sit;Sitting - Scoot to [employer] of Bed;Sit to Supine;Scooting to [facility] Rolling Right: 5: Supervision;With rail Rolling Left: 5: Supervision;With rail Left Sidelying to Sit: With rails;5: Supervision;HOB flat Sitting - Scoot to Edge of Bed: 4: Min guard;With rail Sit to Supine: 5: Supervision;With rail;HOB flat Scooting to HOB: 5: Supervision;With rail Transfers Transfers: Yes Sit to Stand: 4: Min assist;From bed;With upper extremity assist Stand to Sit: 4: Min guard;With upper extremity assist Stand Pivot Transfers: 4: Min assist Locomotion  Ambulation Ambulation: Yes Ambulation/Gait Assistance: 4: Min assist Ambulation Distance (Feet): 60 Feet (inside room and bathroom with IV pole pushed by patient) Assistive device: 1 person hand held assist Gait Gait: Yes Gait Pattern: Step-through pattern;Decreased  step length - right;Decreased step length - left Stairs / Additional Locomotion Stairs: Yes (IV line management) Stairs Assistance: 4: Min assist Stair Management Technique: Two rails Wheelchair Mobility Wheelchair Mobility: No  Trunk/Postural Assessment  Postural Control Postural Control: Deficits on evaluation (minimal left side weakness)  Balance Balance Balance Assessed: Yes Standardized Balance Assessment Standardized Balance Assessment: Berg Balance Test Berg Balance Test Sit to Stand: Able to stand  independently using hands Standing Unsupported: Able to stand safely 2 minutes Sitting with Back Unsupported but Feet Supported on Floor or Stool: Able to sit safely and securely 2 minutes Stand to Sit: Sits safely with minimal use of hands Transfers: Able to transfer safely, minor use of hands Standing Unsupported with Eyes Closed: Able to stand 10 seconds safely Standing Ubsupported with Feet Together: Able to place feet together independently and stand 1 minute safely From Standing, Reach Forward with Outstretched Arm: Can reach forward >5 cm safely (2") From Standing Position, Pick up Object from Floor: Able to pick up shoe safely and easily From Standing Position, Turn to Look Behind Over each Shoulder: Looks behind from both sides and weight shifts well Turn 360 Degrees: Able to turn 360 degrees safely but slowly Standing Unsupported, Alternately Place Feet on Step/Stool: Needs assistance to keep from falling or unable to try Standing Unsupported, One Foot in Front: Loses balance while stepping or standing Standing on One Leg: Tries to lift leg/unable to hold 3 seconds but remains standing independently Total Score: 40 Extremity Assessment  RLE Assessment RLE Assessment: Within Functional Limits LLE Assessment LLE Assessment: Exceptions to WFL (L LE grossly 3+/5)  FIM:  FIM - Bed/Chair Transfer Bed/Chair Transfer Assistive Devices: Bed rails Bed/Chair Transfer: 5:  Supine >  Sit: Supervision (verbal cues/safety issues);5: Sit > Supine: Supervision (verbal cues/safety issues);4: Bed > Chair or W/C: Min A (steadying Pt. > 75%);4: Chair or W/C > Bed: Min A (steadying Pt. > 75%) FIM - Locomotion: Wheelchair Locomotion: Wheelchair: 0: Activity did not occur FIM - Locomotion: Ambulation Locomotion: Ambulation Assistive Devices: Other (comment) (pushing IV pole) Ambulation/Gait Assistance: 4: Min assist Locomotion: Ambulation: 2: Travels 50 - 149 ft with minimal assistance (Pt.>75%) FIM - Locomotion: Stairs Locomotion: Scientist, physiological: Hand rail - 2 Locomotion: Stairs: 2: Up and Down 4 - 11 stairs with minimal assistance (Pt.>75%)   Refer to Care Plan for Long Term Goals  Recommendations for other services: None  Discharge Criteria: Patient will be discharged from PT if patient refuses treatment 3 consecutive times without medical reason, if treatment goals not met, if there is a change in medical status, if patient makes no progress towards goals or if patient is discharged from hospital.  The above assessment, treatment plan, treatment alternatives and goals were discussed and mutually agreed upon: by patient  Clearence Ped 03/13/2014, 5:11 PM

## 2014-03-13 NOTE — Evaluation (Signed)
Speech Language Pathology Assessment and Plan  Patient Details  Name: Madison [name] MRN: [id number] Date of Birth: [birth year]  SLP Diagnosis: Dysarthria;Cognitive Impairments;Dysphagia  Rehab Potential: Excellent ELOS: 7-14 days   Today's Date: 03/13/2014 Time: 1100-1200 Time Calculation (min): 60 min  Problem List:  Patient Active Problem List   Diagnosis Date Noted  . AKI (acute kidney injury) 03/12/2014  . CVA (cerebral infarction) 03/12/2014  . Acute respiratory failure 03/12/2014  . DKA (diabetic ketoacidoses) 03/08/2014  . Septic shock(785.52) 03/08/2014  . Severe sepsis(995.92) 03/08/2014  . Metabolic acidosis 99/37/1696  . Encounter for therapeutic drug monitoring 12/31/2013  . Pain in joint, ankle and foot 09/30/2013  . Type I (juvenile type) diabetes mellitus with neurological manifestations, not stated as uncontrolled 07/31/2013  . Chronic anticoagulation 07/28/2013  . S/P AVR 07/28/2013  . Long term (current) use of anticoagulants 07/28/2013  . Aortic valve disorders 07/28/2013  . CAD (coronary artery disease)   . Obesity   . Hypercholesteremia   . HTN (hypertension)   . Dyslipidemia   . Stroke syndrome   . Aortic stenosis   . Thrombophlebitis   . Diabetes   . Hyperlipidemia   . Rheumatoid arthritis(714.0)    Past Medical History:  Past Medical History  Diagnosis Date  . CAD (coronary artery disease)   . Obesity   . Hypercholesteremia   . HTN (hypertension)   . Dyslipidemia   . Stroke syndrome   . Aortic stenosis   . Thrombophlebitis   . Diabetes   . Hyperlipidemia   . Rheumatoid arthritis(714.0)    Past Surgical History:  Past Surgical History  Procedure Laterality Date  . Cesarean section    . Foot fracture surgery    . Knee surgery    . Neuroplasty / transposition median nerve at carpal tunnel      Assessment / Plan / Recommendation Clinical Impression Madison [name] is a 53 y.o. right-handed female with history of diabetes mellitus,  hypertension, CAD with CABG as well as aortic valve replacement on chronic Coumadin. Patient was independent prior to admission with ADLs and mobility. Admitted 03/08/2014 with altered mental status, hypoglycemia and acute respiratory failure. Patient was intubated in the emergency room. Noted blood sugar of 1124 as well as potassium 7.2. 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 INR on admission of 1.37. Echocardiogram and carotid Dopplers are pending. Critical medicine consulted for severe DKA metabolic acidosis hyperkalemia. Placed on intravenous fluids. Patient was extubated 03/09/2014. Neurology services consulted patient presently remains on Coumadin therapy with heparin bridge for secondary stroke prevention. Maintained on a dysphagia 3 thin liquid diet. Physical therapy evaluation completed 03/09/2014 with recommendations for physical medicine rehabilitation consult. Patient was admitted for comprehensive rehabilitation program. The patient would benefit from cognitive, speech/lang and dysphagia rehabilitation to increase overall functional independence related to problem solving, memory and dysarthria. ST services necessary to determine tolerance of least restrictive diet.   Skilled Therapeutic Interventions          Bedside swallow study completed. Pt noted with no overt s/s of aspiration with thin liquid via cup and straw. Pt noted with appropriate bolus manipulation with D3 consistency. Slp discussed diet upgrade to D4 consistency with patient. Pt slightly hesitant stating that she usually eats soft foods because she prefers them but that she can tolerate tough meats also. Pt agreed to trials of D4 to assess ability to upgrade. Speech and Language evaluation completed. Pt's expressive and  receptive language abilities appear WFL. Pt noted with decreased speech intelligibility, which she reports is new following most recent stroke.  Pt's voice characterized as hoarse, slurred words and low volume. Pt noted with mild cognitive deficits, including: complex problem solving and recall of new information. Pt stated that she was independent with medication management and finances. Pt's attention, safety awareness and sequencing appear to be [facility].   SLP Assessment  Patient will need skilled Speech Lanaguage Pathology Services during CIR admission    Recommendations  Diet Recommendations: Dysphagia 3 (Mechanical Soft);Thin liquid Liquid Administration via: Cup;Straw Medication Administration: Whole meds with liquid Supervision: Patient able to self feed Postural Changes and/or Swallow Maneuvers: Seated upright 90 degrees Oral Care Recommendations: Oral care BID Patient destination: Home Follow up Recommendations: Home Health SLP Equipment Recommended: None recommended by SLP    SLP Frequency 5 out of 7 days   SLP Treatment/Interventions Cognitive remediation/compensation;Environmental controls;Speech/Language facilitation;Cueing hierarchy;Functional tasks;Therapeutic Activities;Internal/external aids;Oral motor exercises;Patient/family education;Medication managment;Dysphagia/aspiration precaution training    Pain Pain Assessment Pain Assessment: No/denies pain Prior Functioning Cognitive/Linguistic Baseline: Within functional limits Type of Home: Apartment  Lives With: Son Available Help at Discharge: Personal care attendant Education: some college Vocation: On disability  Short Term Goals: Week 1: SLP Short Term Goal 1 (Week 1): The patient will tolerate trials of D4 consistency with no overt s/s of aspiration and appropriate bolus manipulation.  SLP Short Term Goal 2 (Week 1): The patient will utilize compensatory memory strategies to recall new information, min assist.  SLP Short Term Goal 3 (Week 1): The patient will complete complex problem solving tasks with min assist.  SLP Short Term Goal 4 (Week 1): The patient  will utilize compensatory speech strategies to increase speech intelligibility.   See FIM for current functional status Refer to Care Plan for Long Term Goals  Recommendations for other services: None  Discharge Criteria: Patient will be discharged from SLP if patient refuses treatment 3 consecutive times without medical reason, if treatment goals not met, if there is a change in medical status, if patient makes no progress towards goals or if patient is discharged from hospital.  The above assessment, treatment plan, treatment alternatives and goals were discussed and mutually agreed upon: by patient  Adele Dan 03/13/2014, 5:34 PM

## 2014-03-13 NOTE — Progress Notes (Signed)
Physical Therapy Session Note  Patient Details  Name: Madison [name] MRN: [id number] Date of Birth: 1960-12-09  Today's Date: 03/13/2014 Time: 1300-1330 Time Calculation (min): 30 min  Therapy Documentation Precautions:  Precautions Precautions: Fall Restrictions Weight Bearing Restrictions: No Pain: Pain Assessment Pain Assessment: No/denies pain  Therapeutic Activity:(30')  BERG balance testing (40/56) as well as transfer training bed<->w/c and bed mobility rolling and supine to sit with S/Mod-I using rails.  See FIM for current functional status  Therapy/Group: Individual Therapy  Rex Kras 03/13/2014, 5:15 PM

## 2014-03-14 ENCOUNTER — Inpatient Hospital Stay (HOSPITAL_COMMUNITY): Payer: Medicare HMO | Admitting: Physical Therapy

## 2014-03-14 LAB — GLUCOSE, CAPILLARY
GLUCOSE-CAPILLARY: 110 mg/dL — AB (ref 70–99)
GLUCOSE-CAPILLARY: 137 mg/dL — AB (ref 70–99)
Glucose-Capillary: 230 mg/dL — ABNORMAL HIGH (ref 70–99)
Glucose-Capillary: 217 mg/dL — ABNORMAL HIGH (ref 70–99)
Glucose-Capillary: 226 mg/dL — ABNORMAL HIGH (ref 70–99)

## 2014-03-14 LAB — CULTURE, BLOOD (ROUTINE X 2)
CULTURE: NO GROWTH
Culture: NO GROWTH

## 2014-03-14 LAB — HEPARIN LEVEL (UNFRACTIONATED): Heparin Unfractionated: 0.53 IU/mL (ref 0.30–0.70)

## 2014-03-14 LAB — CBC
HCT: 30.5 % — ABNORMAL LOW (ref 36.0–46.0)
HEMOGLOBIN: 10 g/dL — AB (ref 12.0–15.0)
MCH: 28.3 pg (ref 26.0–34.0)
MCHC: 32.8 g/dL (ref 30.0–36.0)
MCV: 86.4 fL (ref 78.0–100.0)
PLATELETS: 183 10*3/uL (ref 150–400)
RBC: 3.53 MIL/uL — ABNORMAL LOW (ref 3.87–5.11)
RDW: 13.9 % (ref 11.5–15.5)
WBC: 5.6 10*3/uL (ref 4.0–10.5)

## 2014-03-14 LAB — PROTIME-INR
INR: 1.68 — ABNORMAL HIGH (ref 0.00–1.49)
PROTHROMBIN TIME: 19.3 s — AB (ref 11.6–15.2)

## 2014-03-14 MED ORDER — WARFARIN SODIUM 7.5 MG PO TABS
7.5000 mg | ORAL_TABLET | Freq: Once | ORAL | Status: AC
  Administered 2014-03-14: 7.5 mg via ORAL
  Filled 2014-03-14: qty 1

## 2014-03-14 NOTE — Progress Notes (Signed)
ANTICOAGULATION CONSULT NOTE - Follow Up Consult  Pharmacy Consult:  Heparin / Coumadin Indication:  AVR  Allergies  Allergen Reactions  . Celebrex [Celecoxib] Rash  . Detrol [Tolterodine]     Patient Measurements: Height: 5\' 3"  (160 cm) Weight: 153 lb 14.4 oz (69.809 kg) IBW/kg (Calculated) : 52.4 Heparin Dosing Weight: 79 kg  Vital Signs: Temp: 97.7 F (36.5 C) (04/26 0510) Temp src: Oral (04/26 0510) BP: 140/50 mmHg (04/26 0555) Pulse Rate: 71 (04/26 0510)  Labs:  Recent Labs  03/12/14 0503  03/13/14 0625 03/13/14 1307 03/14/14 0625  HGB 10.0*  --   --  10.0* 10.0*  HCT 30.1*  --   --  30.0* 30.5*  PLT 122*  --   --  167 183  LABPROT 16.1*  --  18.0*  --  19.3*  INR 1.32  --  1.53*  --  1.68*  HEPARINUNFRC 0.18*  < > 0.39 0.42 0.53  < > = values in this interval not displayed.  Estimated Creatinine Clearance: 76.3 ml/min (by C-G formula based on Cr of 0.74).     Assessment: 19 YOF s/p CVA and NSTEMI to continue on Coumadin and IV heparin bridge for history of AVR.  INR trended up to 1.68 this AM on increased warfarin dose but remains sub-therapeutic (on Coumadin 5mg  PO daily at home).  Heparin level remains therapeutic this AM.  No bleeding nor complications per RN.     Goal of Therapy:  Heparin level: 0.3-0.5 units/mL INR 2.5-3.5 per Neuro Monitor platelets by anticoagulation protocol: Yes    Plan:  - Repeat Coumadin 7.5mg  PO today - Continue heparin gtt at 1100 units/hr - Daily HL / CBC / PT / INR    40, PharmD.  Clinical Pharmacist Pager [phone]     Addendum: Repeat HL was therapeutic at 0.42. Plt trended up to 167. Continue at the current heparin rate and f/u daily AM HL.   Vinnie Level, PharmD.  Clinical Pharmacist Pager [phone]

## 2014-03-14 NOTE — Progress Notes (Signed)
Physical Therapy Session Note  Patient Details  Name: Madison [name] MRN: [id number] Date of Birth: November 30, 1960  Today's Date: 03/14/2014 Time: 4401-0272 Time Calculation (min): 41 min  Short Term Goals: Week 1:  PT Short Term Goal 1 (Week 1): =LTG secondary to short LOS  Skilled Therapeutic Interventions/Progress Updates:   Pt asleep in bed; pt reporting fatigue today but willing to participate.  Pt performed supine > sit with bed rail and supervision with assistance to manage IV pole.  Pt requesting to use toilet.  Performed ambulation in room to/from toilet with min HHA and assistance to manage IV pole but pt able to perform all toileting and hand hygiene tasks with supervision/steadying assist.  Returned to EOB; pt requested to don clothing for the day.  Pt set up with underwear, pants and shirt.  Pt engaged in dressing activity at EOB with focus on problem solving/sequencing dressing tasks, use of LUE during dressing task to orient and don clothing, sit <> stand from bed multiple times to pull up lower body clothing with verbal cues for LLE placement for activation and weight shift to LLE and balance during standing without UE support. During dressing pt required supervision and intermittent verbal and visual cues to orient clothing correctly as well as problem solving donning weaker side first.  Following dressing task performed NMR; see below for details.  Pt tolerated well; pt set up in recliner with all items within reach at end of session.   Therapy Documentation Precautions:  Precautions Precautions: Fall Restrictions Weight Bearing Restrictions: No Pain:  No c/o pain Locomotion : Ambulation Ambulation/Gait Assistance: 4: Min assist  Other Treatments: Treatments Neuromuscular Facilitation: Left;Upper Extremity;Lower Extremity;Forced use;Activity to increase coordination;Activity to increase motor control;Activity to increase timing and sequencing;Activity to increase sustained  activation;Activity to increase lateral weight shifting;Activity to increase anterior-posterior weight shifting during dynamic balance and gait training in hallway with focus on weight shifting during R and L lateral stepping, retro stepping and heel-toe gait for increased hip strategy in standing all with min A for balance and tactile cues for full weight shifting.    See FIM for current functional status  Therapy/Group: Individual Therapy  [place] 03/14/2014, 12:19 PM

## 2014-03-14 NOTE — IPOC Note (Signed)
Overall Plan of Care [facility]) Patient Details Name: Madison [name] MRN: [id number] DOB: October 31, 1961  Admitting Diagnosis: RT CVA  Hospital Problems: Active Problems:   CVA (cerebral infarction)     Functional Problem List: Nursing Behavior;Bladder;Bowel;Medication Management;Nutrition;Pain;Safety;Skin Integrity  PT Balance;Endurance;Motor;Safety  OT Balance;Cognition;Motor;Endurance;Safety;Sensory;Vision  SLP Cognition  TR         Basic ADL's: OT Eating;Grooming;Bathing;Dressing;Toileting     Advanced  ADL's: OT Simple Meal Preparation     Transfers: PT Bed Mobility;Bed to Chair;Car;Furniture  OT Toilet;Tub/Shower     Locomotion: PT Ambulation;Stairs     Additional Impairments: OT Fuctional Use of Upper Extremity  SLP Swallowing;Social Cognition   Problem Solving;Memory;Awareness  TR      Anticipated Outcomes Item Anticipated Outcome  Self Feeding modified independent  Swallowing  Independent   Basic self-care  modified independent  Toileting  modified independent   Bathroom Transfers modified independent  Bowel/Bladder  cont of bowel and bladder  Transfers  I/Mod-I  Locomotion  300' with LRAD S/Mod-Independent in community  Communication  WFL  Cognition  Mod I  Pain  les than 2  Safety/Judgment  adhere to safety protocol   Therapy Plan: PT Intensity: Minimum of 1-2 x/day ,45 to 90 minutes PT Frequency: 5 out of 7 days PT Duration Estimated Length of Stay: 7 days OT Intensity: Minimum of 1-2 x/day, 45 to 90 minutes OT Frequency: 5 out of 7 days OT Duration/Estimated Length of Stay: 7 days SLP Frequency: 5 out of 7 days SLP Duration/Estimated Length of Stay: 7-14 days       Team Interventions: Nursing Interventions Patient/Family Education;Bladder Management;Bowel Management;Disease Management/Prevention;Skin Care/Wound Management;Medication Management;Pain Management;Discharge Planning;Psychosocial Support  PT interventions Ambulation/gait  training;Balance/vestibular training;DME/adaptive equipment instruction;Functional mobility training;Neuromuscular re-education;Patient/family education;Stair training;Therapeutic Activities;Therapeutic Exercise;UE/LE Strength taining/ROM;UE/LE Coordination activities  OT Interventions Balance/vestibular training;Cognitive remediation/compensation;DME/adaptive equipment instruction;Neuromuscular re-education;Self Care/advanced ADL retraining;Therapeutic Exercise;UE/LE Strength taining/ROM;UE/LE Coordination activities;Patient/family education;Functional electrical stimulation;Community reintegration;Discharge planning;Functional mobility training;Psychosocial support;Therapeutic Activities;Visual/perceptual remediation/compensation  SLP Interventions Cognitive remediation/compensation;Environmental controls;Speech/Language facilitation;Cueing hierarchy;Functional tasks;Therapeutic Activities;Internal/external aids;Oral motor exercises;Patient/family education;Medication managment;Dysphagia/aspiration precaution training  TR Interventions    SW/CM Interventions      Team Discharge Planning: Destination: PT-Home ,OT- Home , SLP-Home Projected Follow-up: PT-Home health PT, OT-  Home health OT, SLP-Home Health SLP Projected Equipment Needs: PT- , OT- Tub/shower seat, SLP-None recommended by SLP Equipment Details: PT- , OT-  Patient/family involved in discharge planning: PT- Patient,  OT-Patient, SLP-Patient  MD ELOS: 10-12d Medical Rehab Prognosis:  Good Assessment: 53 y.o. right-handed female with history of diabetes mellitus, hypertension, CAD with CABG as well as aortic valve replacement on chronic Coumadin. Patient was independent prior to admission with ADLs and mobility. Admitted 03/08/2014 with altered mental status, hypoglycemia and acute respiratory failure. Patient was intubated in the emergency room. Noted blood sugar of 1124 as well as potassium 7.2. Cranial CT scan showed old posterior right  MCA infarct as well his acute subacute anterior frontal ischemic stroke. INR on admission of 1.37. Echocardiogram and carotid Dopplers are pending. Critical medicine consulted for severe DKA metabolic acidosis hyperkalemia. Placed on intravenous fluids. Patient was extubated 03/09/2014. Neurology services consulted patient presently remains on Coumadin therapy with heparin bridge for secondary stroke prevention  Now requiring 24/7 Rehab RN,MD, as well as CIR level PT, OT and SLP.  Treatment team will focus on ADLs and mobility with goals set at Mod I     See Team Conference Notes for weekly updates to the plan of care

## 2014-03-15 ENCOUNTER — Inpatient Hospital Stay (HOSPITAL_COMMUNITY): Payer: Commercial Managed Care - HMO | Admitting: Rehabilitation

## 2014-03-15 ENCOUNTER — Encounter (HOSPITAL_COMMUNITY): Payer: Commercial Managed Care - HMO | Admitting: Occupational Therapy

## 2014-03-15 ENCOUNTER — Encounter (HOSPITAL_COMMUNITY): Payer: Self-pay | Admitting: *Deleted

## 2014-03-15 ENCOUNTER — Inpatient Hospital Stay (HOSPITAL_COMMUNITY): Payer: Medicare HMO | Admitting: Speech Pathology

## 2014-03-15 ENCOUNTER — Telehealth: Payer: Self-pay | Admitting: General Practice

## 2014-03-15 DIAGNOSIS — I6350 Cerebral infarction due to unspecified occlusion or stenosis of unspecified cerebral artery: Secondary | ICD-10-CM

## 2014-03-15 LAB — CBC WITH DIFFERENTIAL/PLATELET
Basophils Absolute: 0 10*3/uL (ref 0.0–0.1)
Basophils Relative: 0 % (ref 0–1)
EOS PCT: 5 % (ref 0–5)
Eosinophils Absolute: 0.3 10*3/uL (ref 0.0–0.7)
HEMATOCRIT: 30.5 % — AB (ref 36.0–46.0)
Hemoglobin: 10.1 g/dL — ABNORMAL LOW (ref 12.0–15.0)
LYMPHS ABS: 3 10*3/uL (ref 0.7–4.0)
Lymphocytes Relative: 48 % — ABNORMAL HIGH (ref 12–46)
MCH: 28.7 pg (ref 26.0–34.0)
MCHC: 33.1 g/dL (ref 30.0–36.0)
MCV: 86.6 fL (ref 78.0–100.0)
MONOS PCT: 8 % (ref 3–12)
Monocytes Absolute: 0.5 10*3/uL (ref 0.1–1.0)
NEUTROS PCT: 39 % — AB (ref 43–77)
Neutro Abs: 2.4 10*3/uL (ref 1.7–7.7)
Platelets: 222 10*3/uL (ref 150–400)
RBC: 3.52 MIL/uL — AB (ref 3.87–5.11)
RDW: 14.2 % (ref 11.5–15.5)
WBC: 6.2 10*3/uL (ref 4.0–10.5)

## 2014-03-15 LAB — COMPREHENSIVE METABOLIC PANEL
ALBUMIN: 2.4 g/dL — AB (ref 3.5–5.2)
ALT: 25 U/L (ref 0–35)
AST: 29 U/L (ref 0–37)
Alkaline Phosphatase: 84 U/L (ref 39–117)
BUN: 10 mg/dL (ref 6–23)
CALCIUM: 8.6 mg/dL (ref 8.4–10.5)
CHLORIDE: 105 meq/L (ref 96–112)
CO2: 22 meq/L (ref 19–32)
Creatinine, Ser: 0.72 mg/dL (ref 0.50–1.10)
GFR calc Af Amer: >90 mL/min (ref >90)
Glucose, Bld: 242 mg/dL — ABNORMAL HIGH (ref 70–99)
Potassium: 4.2 mEq/L (ref 3.7–5.3)
SODIUM: 139 meq/L (ref 137–147)
Total Bilirubin: <0.2 mg/dL — ABNORMAL LOW (ref 0.3–1.2)
Total Protein: 5.8 g/dL — ABNORMAL LOW (ref 6.0–8.3)

## 2014-03-15 LAB — PROTIME-INR
INR: 1.74 — ABNORMAL HIGH (ref 0.00–1.49)
Prothrombin Time: 19.8 seconds — ABNORMAL HIGH (ref 11.6–15.2)

## 2014-03-15 LAB — HEPARIN LEVEL (UNFRACTIONATED): HEPARIN UNFRACTIONATED: 0.38 [IU]/mL (ref 0.30–0.70)

## 2014-03-15 LAB — GLUCOSE, CAPILLARY
GLUCOSE-CAPILLARY: 232 mg/dL — AB (ref 70–99)
GLUCOSE-CAPILLARY: 67 mg/dL — AB (ref 70–99)
Glucose-Capillary: 85 mg/dL (ref 70–99)
Glucose-Capillary: 193 mg/dL — ABNORMAL HIGH (ref 70–99)
Glucose-Capillary: 94 mg/dL (ref 70–99)

## 2014-03-15 MED ORDER — WARFARIN SODIUM 10 MG PO TABS
10.0000 mg | ORAL_TABLET | Freq: Once | ORAL | Status: AC
  Administered 2014-03-15: 10 mg via ORAL
  Filled 2014-03-15: qty 1

## 2014-03-15 NOTE — Progress Notes (Signed)
Hypoglycemic Event  CBG: 67  Treatment: 15 GM carbohydrate snack   Symptoms: None  Follow-up CBG: Time: 21:55 CBG Result94  Possible Reasons for Event: Inadequate meal intake  Comments/MD notified: per protocol/via sticky note    Janyth Pupa  Remember to initiate Hypoglycemia Order Set & complete

## 2014-03-15 NOTE — Progress Notes (Signed)
Speech Language Pathology Daily Session Note  Patient Details  Name: Madison [name] MRN: [id number] Date of Birth: 17-Oct-1961  Today's Date: 03/15/2014 Time: 4098-1191 Time Calculation (min): 45 min  Short Term Goals: Week 1: SLP Short Term Goal 1 (Week 1): The patient will tolerate trials of D4 consistency with no overt s/s of aspiration and appropriate bolus manipulation.  SLP Short Term Goal 2 (Week 1): The patient will utilize compensatory memory strategies to recall new information, min assist.  SLP Short Term Goal 3 (Week 1): The patient will complete complex problem solving tasks with min assist.  SLP Short Term Goal 4 (Week 1): The patient will utilize compensatory speech strategies to increase speech intelligibility.   Skilled Therapeutic Interventions: Skilled treatment session focused on addressing cognition goals.  SLP facilitated session with moderately complex functional daily math problems that patient complete Mod I. SLP also facilitated session with creation of external aids to assist with recall of new medication information.  Recommend patient demonstrate ability to accurately load medication box tomorrow.     FIM:  Comprehension Comprehension Mode: Auditory Comprehension: 6-Follows complex conversation/direction: With extra time/assistive device Expression Expression: 5-Expresses complex 90% of the time/cues < 10% of the time Social Interaction Social Interaction: 6-Interacts appropriately with others with medication or extra time (anti-anxiety, antidepressant). Problem Solving Problem Solving: 5-Solves basic problems: With no assist Memory Memory: 5-Recognizes or recalls 90% of the time/requires cueing < 10% of the time  Pain Pain Assessment Pain Assessment: No/denies pain Pain Score: 0-No pain  Therapy/Group: Individual Therapy  Charlane Ferretti., CCC-SLP [phone]  [name] 03/15/2014, 12:28 PM

## 2014-03-15 NOTE — Telephone Encounter (Signed)
INR range changed to 2.5 to 3.5 per Dr. Vanessa Barbara on 4/24.

## 2014-03-15 NOTE — Progress Notes (Signed)
Patient information reviewed and entered into eRehab system by Perlita Forbush, RN, CRRN, PPS Coordinator.  Information including medical coding and functional independence measure will be reviewed and updated through discharge.     Per nursing patient was given "Data Collection Information Summary for Patients in Inpatient Rehabilitation Facilities with attached "Privacy Act Statement-Health Care Records" upon admission.  

## 2014-03-15 NOTE — Progress Notes (Signed)
Occupational Therapy Session Note  Patient Details  Name: Madison [name] MRN: [id number] Date of Birth: [birth year]  Today's Date: 03/15/2014 Time: 4695-0722 Time Calculation (min): 55 min  Short Term Goals: Week 1:  OT Short Term Goal 1 (Week 1): STGs equal to LTGs set at overall modified independent level.  Skilled Therapeutic Interventions/Progress Updates:    1:1 Ptin bed when arrived. Self care retraining with focus on functional ambulation without AD to and from bathroom- negotiating thresholds, toilet transfer, toileting, bathing peri area sit to stand and changing underwear. Pt able to perform sit <> mod I with more than reasonable amt of time. Pt chose to not fully bathe today. Pt's left hand a little swollen today compared to other days per patient.  Continued to discuss d/c planning. Focus on sit <>stands in the gym from low mat with attention to symmetrical controlled, slow movements and functional ambulation without AD. Pt continues to present with slight circumduction of left LE with ambulation. Tactile cues for upright posture.   Therapy Documentation Precautions:  Precautions Precautions: Fall Restrictions Weight Bearing Restrictions: No Pain: No c/o pain (always soreness at IV site in neck)  See FIM for current functional status  Therapy/Group: Individual Therapy  Melonie Florida 03/15/2014, 8:03 AM

## 2014-03-15 NOTE — Care Management Note (Signed)
Inpatient Rehabilitation Center Individual Statement of Services  Patient Name:  Cailynn Bodnar  Date:  03/15/2014  Welcome to the Inpatient Rehabilitation Center.  Our goal is to provide you with an individualized program based on your diagnosis and situation, designed to meet your specific needs.  With this comprehensive rehabilitation program, you will be expected to participate in at least 3 hours of rehabilitation therapies Monday-Friday, with modified therapy programming on the weekends.  Your rehabilitation program will include the following services:  Physical Therapy (PT), Occupational Therapy (OT), Speech Therapy (ST), 24 hour per day rehabilitation nursing, Neuropsychology, Case Management (Social Worker), Rehabilitation Medicine, Nutrition Services and Pharmacy Services  Weekly team conferences will be held on Wednesday to discuss your progress.  Your Social Worker will talk with you frequently to get your input and to update you on team discussions.  Team conferences with you and your family in attendance may also be held.  Expected length of stay: 7-10 days Overall anticipated outcome: mod/i-supervision level  Depending on your progress and recovery, your program may change. Your Social Worker will coordinate services and will keep you informed of any changes. Your Social Worker's name and contact numbers are listed  below.  The following services may also be recommended but are not provided by the Inpatient Rehabilitation Center:    Home Health Rehabiltiation Services  Outpatient Rehabilitation Services    Arrangements will be made to provide these services after discharge if needed.  Arrangements include referral to agencies that provide these services.  Your insurance has been verified to be:  [facility] & Medicaid Your primary doctor is:  Dr Adline Mango  Pertinent information will be shared with your doctor and your insurance company.  Social Worker:  Dossie Der, SW [phone] or [phone]  Information discussed with and copy given to patient by: Lucy Chris, 03/15/2014, 12:47 PM

## 2014-03-15 NOTE — Progress Notes (Signed)
Social Work Assessment and Plan Social Work Assessment and Plan  Patient Details  Name: Madison [name] MRN: [id number] Date of Birth: 10/24/1961  Today's Date: 03/15/2014  Problem List:  Patient Active Problem List   Diagnosis Date Noted  . AKI (acute kidney injury) 03/12/2014  . CVA (cerebral infarction) 03/12/2014  . Acute respiratory failure 03/12/2014  . DKA (diabetic ketoacidoses) 03/08/2014  . Septic shock(785.52) 03/08/2014  . Severe sepsis(995.92) 03/08/2014  . Metabolic acidosis 03/08/2014  . Encounter for therapeutic drug monitoring 12/31/2013  . Pain in joint, ankle and foot 09/30/2013  . Type I (juvenile type) diabetes mellitus with neurological manifestations, not stated as uncontrolled 07/31/2013  . Chronic anticoagulation 07/28/2013  . S/P AVR 07/28/2013  . Long term (current) use of anticoagulants 07/28/2013  . Aortic valve disorders 07/28/2013  . CAD (coronary artery disease)   . Obesity   . Hypercholesteremia   . HTN (hypertension)   . Dyslipidemia   . Stroke syndrome   . Aortic stenosis   . Thrombophlebitis   . Diabetes   . Hyperlipidemia   . Rheumatoid arthritis(714.0)    Past Medical History:  Past Medical History  Diagnosis Date  . CAD (coronary artery disease)   . Obesity   . Hypercholesteremia   . HTN (hypertension)   . Dyslipidemia   . Stroke syndrome   . Aortic stenosis   . Thrombophlebitis   . Diabetes   . Hyperlipidemia   . Rheumatoid arthritis(714.0)    Past Surgical History:  Past Surgical History  Procedure Laterality Date  . Cesarean section    . Foot fracture surgery    . Knee surgery    . Neuroplasty / transposition median nerve at carpal tunnel     Social History:  reports that she has never smoked. She does not have any smokeless tobacco history on file. She reports that she does not drink alcohol or use illicit drugs.  Family / Support Systems Marital Status: Divorced How Long?: 3 years Patient Roles: Parent Children:  73 yo son in [place] coming to here after HS graduation in May, to stay Other Supports: [name] Summers-sister  [phone] Anticipated Caregiver: Self and CAP worker-80 hours per month Ability/Limitations of Caregiver: Will ned to be mod/i before returning home doesn't have 24 hr care Caregiver Availability: Other (Comment) (Has CAP worker-during the day-alone at night) Family Dynamics: Recently moved here from [facility] in Aug 2014, sister is here and her two nieces.  She has her CAP worker who is also her friend.  Her Mom and Aunts are in [place].  She feels she has good supports here.  She is looking forward to seeing her older son and having him move here in May.  Social History Preferred language: English Religion:  Cultural Background: No issues Education: High School Read: Yes Write: Yes Employment Status: Disabled Fish farm manager Issues: No issues Guardian/Conservator: None-according to MD pt is capable of making her own decisions while here.  Her sister is caring for her son while she is here.   Abuse/Neglect Physical Abuse: Denies Verbal Abuse: Denies Sexual Abuse: Denies Exploitation of patient/patient's resources: Denies Self-Neglect: Denies  Emotional Status Pt's affect, behavior adn adjustment status: Pt is motivated to improve and wants to regain her independence.  She can see the progress she has made already and is encouraged by it.  Her goal is to do well and be able to fly to [place] for her son's graduation May 21.  She has good support and has a  positive attitude. Recent Psychosocial Issues: Other medical issues-previous strokes, has managed and remained independent Pyschiatric History: History of depression is taking medication and feels this helps her.  Deferred depression screen due to pt felt it was not necessary at this time.  Will monitor her while here and interevene if necessary, may have Neuro-psych see due to young age and mulitiple losses in a short  time. Substance Abuse History: No issues  Patient / Family Perceptions, Expectations & Goals Pt/Family understanding of illness & functional limitations: Pt is able to explain her stroke and deficits.  She is encouraged by her progress and strides since coming into the hospital.  She is goal oriented and wants to do well and retrun home ASAP, but also be ready.  She ahs an aide who alreayd does all of the home management and transportation. Premorbid pt/family roles/activities: Mother, Retiree, Sister, Friend, etc Anticipated changes in roles/activities/participation: resume Pt/family expectations/goals: Pt states:" I want to be able to take care of myself, Iike I did before this."  Sister states: " I hope she can do well here."  [employer]: Other (Comment) (CAP worker 80 hours per month) Premorbid Home Care/DME Agencies: Other (Comment) (Had in past) Transportation available at discharge: CAP worker or sister, pt doesn't drive Resource referrals recommended: Support group (specify) ([facility] Support group)  Discharge Planning Living Arrangements: Children Support Systems: Children;Parent;Other relatives;Friends/neighbors;Church/faith community;Home care staff Type of Residence: Private residence Insurance Resources: [employer] (specify);Medicaid (specify county) Engineer, materials ) Financial Resources: SSD Financial Screen Referred: No Living Expenses: Rent Money Management: Patient Does the patient have any problems obtaining your medications?: No Home Management: CAP worker did home management prior to admission Patient/Family Preliminary Plans: Return home with CAP worker and sister supplementing if needed.  Pt is hopeful she will be mod/i level at discharge.  Her main goal is to go to her son's HS graduation May 21.  She knows she needs to work hard to achieve this goal.  Also aware to check with MD regarding being cleared to fly. Social Work Anticipated  Follow Up Needs: HH/OP;Support Group  Clinical Impression Pleasant motivated female who is pleased with her progress thus far.  She has good supports via sister and CAP worker. Team evaluations goals are supervision-mod/i level, see if can reach all Mod/i level since will not always have someone at home with her.  See how much sister can be there with her, in case goals are left at supervision level ambulation.  Lemar Livings Janece Laidlaw 03/15/2014, 1:14 PM

## 2014-03-15 NOTE — Progress Notes (Signed)
Coumadin and heparin per pharmacy  Anticoag: Heparin/Coumadin for AVR, HL therapeutic at 0.38 (goal 0.3-0.5), INR up to 1.74 (2.5-3.5 per Dr. Pearlean Brownie). Coumadin 5mg  PO daily PTA but ?noncompliant  Heme: hgb 10.1 and Plt 222 K stable   Goal of Therapy:  Heparin level: 0.3-0.5 units/mL INR 2.5-3.5 per Neuro Monitor platelets by anticoagulation protocol: Yes   Plan:  - Coumadin 10 mg PO x1 (since have been giving 7.5 mg and INR didn't move much) - Continue heparin gtt at 1100 units/hr - Daily HL / CBC / PT / INR - Monitor CBGs

## 2014-03-15 NOTE — Progress Notes (Signed)
Subjective/Complaints: 53 y.o. right-handed female with history of diabetes mellitus, hypertension, CAD with CABG as well as aortic valve replacement on chronic Coumadin. Patient was independent prior to admission with ADLs and mobility. Admitted 03/08/2014 with altered mental status, hypoglycemia and acute respiratory failure. Patient was intubated in the emergency room. Noted blood sugar of 1124 as well as potassium 7.2. 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 INR on admission of 1.37. Echocardiogram and carotid Dopplers are pending. Critical medicine consulted for severe DKA metabolic acidosis hyperkalemia. Placed on intravenous fluids. Patient was extubated 03/09/2014   Objective: Vital Signs: Blood pressure 130/77, pulse 67, temperature 98.2 F (36.8 C), temperature source Oral, resp. rate 17, height '5\' 3"'  (1.6 m), weight 69.7 kg (153 lb 10.6 oz), SpO2 96.00%. No results found. Results for orders placed during the hospital encounter of 03/12/14 (from the past 72 hour(s))  GLUCOSE, CAPILLARY     Status: Abnormal   Collection Time    03/12/14  4:56 PM      Result Value Ref Range   Glucose-Capillary 285 (*) 70 - 99 mg/dL  GLUCOSE, CAPILLARY     Status: Abnormal   Collection Time    03/12/14  7:55 PM      Result Value Ref Range   Glucose-Capillary 226 (*) 70 - 99 mg/dL  PROTIME-INR     Status: Abnormal   Collection Time    03/13/14  6:25 AM      Result Value Ref Range   Prothrombin Time 18.0 (*) 11.6 - 15.2 seconds   INR 1.53 (*) 0.00 - 1.49  HEPARIN LEVEL (UNFRACTIONATED)     Status: None   Collection Time    03/13/14  6:25 AM      Result Value Ref Range   Heparin Unfractionated 0.39  0.30 - 0.70 IU/mL   Comment:            IF HEPARIN RESULTS ARE BELOW     EXPECTED VALUES, AND PATIENT     DOSAGE HAS BEEN CONFIRMED,     SUGGEST FOLLOW UP TESTING     OF ANTITHROMBIN III LEVELS.  GLUCOSE,  CAPILLARY     Status: Abnormal   Collection Time    03/13/14  6:43 AM      Result Value Ref Range   Glucose-Capillary 103 (*) 70 - 99 mg/dL  GLUCOSE, CAPILLARY     Status: Abnormal   Collection Time    03/13/14 12:20 PM      Result Value Ref Range   Glucose-Capillary 174 (*) 70 - 99 mg/dL   Comment 1 Notify RN    HEPARIN LEVEL (UNFRACTIONATED)     Status: None   Collection Time    03/13/14  1:07 PM      Result Value Ref Range   Heparin Unfractionated 0.42  0.30 - 0.70 IU/mL   Comment:            IF HEPARIN RESULTS ARE BELOW     EXPECTED VALUES, AND PATIENT     DOSAGE HAS BEEN CONFIRMED,     SUGGEST FOLLOW UP TESTING     OF ANTITHROMBIN III LEVELS.  CBC     Status: Abnormal   Collection Time    03/13/14  1:07 PM      Result Value Ref Range   WBC 6.7  4.0 - 10.5 K/uL   RBC 3.53 (*) 3.87 - 5.11 MIL/uL   Hemoglobin  10.0 (*) 12.0 - 15.0 g/dL   HCT 30.0 (*) 36.0 - 46.0 %   MCV 85.0  78.0 - 100.0 fL   MCH 28.3  26.0 - 34.0 pg   MCHC 33.3  30.0 - 36.0 g/dL   RDW 13.6  11.5 - 15.5 %   Platelets 167  150 - 400 K/uL  GLUCOSE, CAPILLARY     Status: Abnormal   Collection Time    03/13/14  4:18 PM      Result Value Ref Range   Glucose-Capillary 157 (*) 70 - 99 mg/dL   Comment 1 Notify RN    GLUCOSE, CAPILLARY     Status: None   Collection Time    03/13/14  7:56 PM      Result Value Ref Range   Glucose-Capillary 75  70 - 99 mg/dL  GLUCOSE, CAPILLARY     Status: Abnormal   Collection Time    03/13/14 11:56 PM      Result Value Ref Range   Glucose-Capillary 137 (*) 70 - 99 mg/dL  PROTIME-INR     Status: Abnormal   Collection Time    03/14/14  6:25 AM      Result Value Ref Range   Prothrombin Time 19.3 (*) 11.6 - 15.2 seconds   INR 1.68 (*) 0.00 - 1.49  HEPARIN LEVEL (UNFRACTIONATED)     Status: None   Collection Time    03/14/14  6:25 AM      Result Value Ref Range   Heparin Unfractionated 0.53  0.30 - 0.70 IU/mL   Comment:            IF HEPARIN RESULTS ARE BELOW      EXPECTED VALUES, AND PATIENT     DOSAGE HAS BEEN CONFIRMED,     SUGGEST FOLLOW UP TESTING     OF ANTITHROMBIN III LEVELS.  CBC     Status: Abnormal   Collection Time    03/14/14  6:25 AM      Result Value Ref Range   WBC 5.6  4.0 - 10.5 K/uL   RBC 3.53 (*) 3.87 - 5.11 MIL/uL   Hemoglobin 10.0 (*) 12.0 - 15.0 g/dL   HCT 30.5 (*) 36.0 - 46.0 %   MCV 86.4  78.0 - 100.0 fL   MCH 28.3  26.0 - 34.0 pg   MCHC 32.8  30.0 - 36.0 g/dL   RDW 13.9  11.5 - 15.5 %   Platelets 183  150 - 400 K/uL  GLUCOSE, CAPILLARY     Status: Abnormal   Collection Time    03/14/14  7:12 AM      Result Value Ref Range   Glucose-Capillary 230 (*) 70 - 99 mg/dL   Comment 1 Notify RN    GLUCOSE, CAPILLARY     Status: Abnormal   Collection Time    03/14/14 11:33 AM      Result Value Ref Range   Glucose-Capillary 110 (*) 70 - 99 mg/dL   Comment 1 Notify RN    GLUCOSE, CAPILLARY     Status: Abnormal   Collection Time    03/14/14  4:24 PM      Result Value Ref Range   Glucose-Capillary 217 (*) 70 - 99 mg/dL   Comment 1 Notify RN    GLUCOSE, CAPILLARY     Status: Abnormal   Collection Time    03/14/14  8:24 PM      Result Value Ref Range   Glucose-Capillary 226 (*) 70 -  99 mg/dL  CBC WITH DIFFERENTIAL     Status: Abnormal   Collection Time    03/15/14  6:02 AM      Result Value Ref Range   WBC 6.2  4.0 - 10.5 K/uL   RBC 3.52 (*) 3.87 - 5.11 MIL/uL   Hemoglobin 10.1 (*) 12.0 - 15.0 g/dL   HCT 30.5 (*) 36.0 - 46.0 %   MCV 86.6  78.0 - 100.0 fL   MCH 28.7  26.0 - 34.0 pg   MCHC 33.1  30.0 - 36.0 g/dL   RDW 14.2  11.5 - 15.5 %   Platelets 222  150 - 400 K/uL   Neutrophils Relative % 39 (*) 43 - 77 %   Neutro Abs 2.4  1.7 - 7.7 K/uL   Lymphocytes Relative 48 (*) 12 - 46 %   Lymphs Abs 3.0  0.7 - 4.0 K/uL   Monocytes Relative 8  3 - 12 %   Monocytes Absolute 0.5  0.1 - 1.0 K/uL   Eosinophils Relative 5  0 - 5 %   Eosinophils Absolute 0.3  0.0 - 0.7 K/uL   Basophils Relative 0  0 - 1 %   Basophils  Absolute 0.0  0.0 - 0.1 K/uL  COMPREHENSIVE METABOLIC PANEL     Status: Abnormal   Collection Time    03/15/14  6:02 AM      Result Value Ref Range   Sodium 139  137 - 147 mEq/L   Potassium 4.2  3.7 - 5.3 mEq/L   Chloride 105  96 - 112 mEq/L   CO2 22  19 - 32 mEq/L   Glucose, Bld 242 (*) 70 - 99 mg/dL   BUN 10  6 - 23 mg/dL   Creatinine, Ser 0.72  0.50 - 1.10 mg/dL   Calcium 8.6  8.4 - 10.5 mg/dL   Total Protein 5.8 (*) 6.0 - 8.3 g/dL   Albumin 2.4 (*) 3.5 - 5.2 g/dL   AST 29  0 - 37 U/L   ALT 25  0 - 35 U/L   Alkaline Phosphatase 84  39 - 117 U/L   Total Bilirubin <0.2 (*) 0.3 - 1.2 mg/dL   GFR calc non Af Amer >90  >90 mL/min   GFR calc Af Amer >90  >90 mL/min   Comment: (NOTE)     The eGFR has been calculated using the CKD EPI equation.     This calculation has not been validated in all clinical situations.     eGFR's persistently <90 mL/min signify possible Chronic Kidney     Disease.  PROTIME-INR     Status: Abnormal   Collection Time    03/15/14  6:02 AM      Result Value Ref Range   Prothrombin Time 19.8 (*) 11.6 - 15.2 seconds   INR 1.74 (*) 0.00 - 1.49  HEPARIN LEVEL (UNFRACTIONATED)     Status: None   Collection Time    03/15/14  6:02 AM      Result Value Ref Range   Heparin Unfractionated 0.38  0.30 - 0.70 IU/mL   Comment:            IF HEPARIN RESULTS ARE BELOW     EXPECTED VALUES, AND PATIENT     DOSAGE HAS BEEN CONFIRMED,     SUGGEST FOLLOW UP TESTING     OF ANTITHROMBIN III LEVELS.  GLUCOSE, CAPILLARY     Status: Abnormal   Collection Time    03/15/14  7:21 AM      Result Value Ref Range   Glucose-Capillary 232 (*) 70 - 99 mg/dL   Comment 1 Notify RN       HEENT: normal Cardio: RRR and no murmur Resp: CTA B/L and unlabored GI: BS positive and ND Extremity:  Pulses positive and No Edema Skin:   Intact Neuro: Alert/Oriented, Cranial Nerve Abnormalities Left central 7, Abnormal Motor 3-/5 Left delt bi, tri, grip, HF, KE ADF, Abnormal FMC Ataxic/  dec FMC and Tone  Hypertonia, Tone:  left side increased flexor tone in UE and Dysarthric Musc/Skel:  Normal Gen NAD   Assessment/Plan: 1. Functional deficits secondary to acute /subacute anterior Right  frontal ischemic stroke which require 3+ hours per day of interdisciplinary therapy in a comprehensive inpatient rehab setting. Physiatrist is providing close team supervision and 24 hour management of active medical problems listed below. Physiatrist and rehab team continue to assess barriers to discharge/monitor patient progress toward functional and medical goals. FIM: FIM - Bathing Bathing Steps Patient Completed: Chest;Right Arm;Left Arm;Abdomen;Front perineal area;Buttocks;Right upper leg;Right lower leg (including foot);Left lower leg (including foot) Bathing: 4: Steadying assist  FIM - Upper Body Dressing/Undressing Upper body dressing/undressing steps patient completed: Thread/unthread right sleeve of pullover shirt/dresss;Thread/unthread left sleeve of pullover shirt/dress;Put head through opening of pull over shirt/dress;Pull shirt over trunk Upper body dressing/undressing: 5: Set-up assist to: Obtain clothing/put away FIM - Lower Body Dressing/Undressing Lower body dressing/undressing steps patient completed: Thread/unthread right underwear leg;Thread/unthread left underwear leg;Pull underwear up/down;Thread/unthread right pants leg;Thread/unthread left pants leg;Pull pants up/down Lower body dressing/undressing: 5: Set-up assist to: Obtain clothing  FIM - Toileting Toileting steps completed by patient: Adjust clothing prior to toileting;Performs perineal hygiene;Adjust clothing after toileting Toileting: 4: Steadying assist  FIM - Radio producer Devices: Elevated toilet seat;Grab bars Toilet Transfers: 4-To toilet/BSC: Min A (steadying Pt. > 75%);4-From toilet/BSC: Min A (steadying Pt. > 75%)  FIM - Bed/Chair Transfer Bed/Chair Transfer  Assistive Devices: Bed rails Bed/Chair Transfer: 5: Supine > Sit: Supervision (verbal cues/safety issues);4: Bed > Chair or W/C: Min A (steadying Pt. > 75%);4: Chair or W/C > Bed: Min A (steadying Pt. > 75%)  FIM - Locomotion: Wheelchair Locomotion: Wheelchair: 0: Activity did not occur FIM - Locomotion: Ambulation Locomotion: Ambulation Assistive Devices: Other (comment) (pushing IV pole) Ambulation/Gait Assistance: 4: Min assist Locomotion: Ambulation: 2: Travels 50 - 149 ft with minimal assistance (Pt.>75%)  Comprehension Comprehension Mode: Auditory Comprehension: 6-Follows complex conversation/direction: With extra time/assistive device  Expression Expression Mode: Verbal Expression: 6-Expresses complex ideas: With extra time/assistive device  Social Interaction Social Interaction: 7-Interacts appropriately with others - No medications needed.  Problem Solving Problem Solving: 5-Solves basic 90% of the time/requires cueing < 10% of the time  Memory Memory: 5-Recognizes or recalls 90% of the time/requires cueing < 10% of the time  Medical Problem List and Plan:  1. 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 DVT Prophylaxis/Anticoagulation: Heparin Coumadin therapy with history of aortic valve replacement. INR goal 2.5-3.5.  3. Pain Management: Tylenol as needed.  4. Neuropsych: This patient is capable of making decisions on her own behalf.  5. Diabetes mellitus with peripheral neuropathy. Hemoglobin A1c pending. Levemir 10 units daily. Check blood sugars a.c. and at bedtime  6. CAD with CABG. Aspirin 81 mg daily. No chest pain or shortness of breath  7. Hyperlipidemia. Zocor  8. Insomnia.Rozerem    LOS (Days) 3 A  FACE TO FACE EVALUATION WAS PERFORMED  Charlett Blake 03/15/2014, 8:05 AM

## 2014-03-15 NOTE — Telephone Encounter (Signed)
INR range 2.5 to 3.5 (See hospital DC note on 4/27)

## 2014-03-15 NOTE — Progress Notes (Signed)
Physical Therapy Session Note  Patient Details  Name: Madison [name] MRN: [id number] Date of Birth: 04/27/1961  Today's Date: 03/15/2014 Time: 1345-1430 Time Calculation (min): 45 min  Short Term Goals: Week 1:  PT Short Term Goal 1 (Week 1): =LTG secondary to short LOS  Skilled Therapeutic Interventions/Progress Updates:   Pt received lying in bed asleep, easily aroused by voice and light touch.  Performed bed mobility at supervision level.  Ambulated to/from gym >150' with RW at min/guard to close supervision with cues for slower gait speed for safety and also for upright posture.  Once in gym, performed seated nustep x 5 mins with BLEs only for overall BLE strengthening and NMR to LLE.  Tolerated well at level 3 resistance.  Then performed high level gait with RW while negotiating around cones, over poles and also over compliant surface.  Performed all at min/guard to close supervision level with mod cues for RW placement for safety when stepping over objects and also cues for keeping RW closer to her when turning.  Ended session with functional transfers and bed mobility with nurse (who is in training and orienting to unit).  PT performed bed mobility and transfers with pt first, providing cues on hand placement and how to provide cues for safety to pt.  Nurse returned demonstration very well and discussed on how to assist someone that may need more assist.  RN verbalized understanding and had RN ambulate back to room with pt using RW.  Pt returned to bed with all needs in reach and discussed safety plan to ensure bedrails, bed alarm, etc are set.  Pt left with all needs in reach.   Therapy Documentation Precautions:  Precautions Precautions: Fall Restrictions Weight Bearing Restrictions: No   Vital Signs: Therapy Vitals Temp: 98.1 F (36.7 C) Temp src: Oral Pulse Rate: 66 Resp: 18 BP: 124/68 mmHg Patient Position, if appropriate: Lying Oxygen Therapy SpO2: 97 % O2 Device: None (Room  air) Pain: Pain Assessment Pain Assessment: No/denies pain   Locomotion : Ambulation Ambulation/Gait Assistance: 5: Supervision;4: Min guard   See FIM for current functional status  Therapy/Group: Individual Therapy  Irving Burton A Wanisha Shiroma 03/15/2014, 4:18 PM

## 2014-03-15 NOTE — Progress Notes (Signed)
Physical Therapy Session Note  Patient Details  Name: Madison [name] MRN: [id number] Date of Birth: 11-28-1960  Today's Date: 03/15/2014 Time: 1694-5038 Time Calculation (min): 44 min  Short Term Goals: Week 1:  PT Short Term Goal 1 (Week 1): =LTG secondary to short LOS  Skilled Therapeutic Interventions/Progress Updates:   Pt received sitting in arm chair in room, agreeable to therapy.  Pt requested to use restroom prior to session, therefore assisted via min assist and use of IV pole to restroom.  Pt able to perform adjusting of clothing prior to and following toileting, as well as peri care at supervision level.   Ambulated back out to sink as stated above and pt able to stand to wash hands at supervision level.  Ambulated >150' to therapy gym with use of IV pole on the R side with min/guard assist.  Note that she tends to slightly circumduct LLE to advance.  She states that how she walks now "is normal to me."  Once in therapy gym, provided pt with RW to assess safety with gait.  She was able to ambulated another 150' with RW at supervision to min/guard assist level (min/guard more when turning and negotiating around obstacles).  Also requires mod cues for keeping RW with her when making turns and turning to sit.  Pt ambulated to ortho gym and performed car transfer at min/guard level with min cues for safety when turning (again to keep RW with her).  Once back in therapy gym, ended session with series of high level balance while tossing ball to rebounder with feet apart, feet in semi tandem, then transitioned to standing on foam pad with feet apart while tossing>feet closer together.  Requires min/guard throughout with light min assist during feet closer together on foam.  Pt ambulated back to room as stated above.  Left pt in arm chair with all needs in reach.   Therapy Documentation Precautions:  Precautions Precautions: Fall Restrictions Weight Bearing Restrictions: No   Pain: Pain  Assessment Pain Score: 0-No pain   Locomotion : Ambulation Ambulation/Gait Assistance: 5: Supervision;4: Min guard   See FIM for current functional status  Therapy/Group: Individual Therapy  Madison [name] 03/15/2014, 12:07 PM

## 2014-03-16 ENCOUNTER — Encounter (HOSPITAL_COMMUNITY): Payer: Commercial Managed Care - HMO | Admitting: Occupational Therapy

## 2014-03-16 ENCOUNTER — Inpatient Hospital Stay (HOSPITAL_COMMUNITY): Payer: Commercial Managed Care - HMO | Admitting: Speech Pathology

## 2014-03-16 ENCOUNTER — Inpatient Hospital Stay (HOSPITAL_COMMUNITY): Payer: Commercial Managed Care - HMO | Admitting: Rehabilitation

## 2014-03-16 LAB — CBC
HCT: 31.3 % — ABNORMAL LOW (ref 36.0–46.0)
Hemoglobin: 10.3 g/dL — ABNORMAL LOW (ref 12.0–15.0)
MCH: 28.5 pg (ref 26.0–34.0)
MCHC: 32.9 g/dL (ref 30.0–36.0)
MCV: 86.5 fL (ref 78.0–100.0)
PLATELETS: 254 10*3/uL (ref 150–400)
RBC: 3.62 MIL/uL — ABNORMAL LOW (ref 3.87–5.11)
RDW: 14.2 % (ref 11.5–15.5)
WBC: 5.8 10*3/uL (ref 4.0–10.5)

## 2014-03-16 LAB — GLUCOSE, CAPILLARY
GLUCOSE-CAPILLARY: 217 mg/dL — AB (ref 70–99)
GLUCOSE-CAPILLARY: 214 mg/dL — AB (ref 70–99)
GLUCOSE-CAPILLARY: 99 mg/dL (ref 70–99)
Glucose-Capillary: 190 mg/dL — ABNORMAL HIGH (ref 70–99)

## 2014-03-16 LAB — HEPARIN LEVEL (UNFRACTIONATED): Heparin Unfractionated: 0.55 IU/mL (ref 0.30–0.70)

## 2014-03-16 LAB — PROTIME-INR
INR: 1.9 — ABNORMAL HIGH (ref 0.00–1.49)
PROTHROMBIN TIME: 21.2 s — AB (ref 11.6–15.2)

## 2014-03-16 MED ORDER — WARFARIN SODIUM 7.5 MG PO TABS
7.5000 mg | ORAL_TABLET | Freq: Once | ORAL | Status: AC
  Administered 2014-03-16: 7.5 mg via ORAL
  Filled 2014-03-16: qty 1

## 2014-03-16 MED ORDER — COUMADIN BOOK
Freq: Once | Status: AC
  Administered 2014-03-16: 17:00:00
  Filled 2014-03-16: qty 1

## 2014-03-16 MED ORDER — HEPARIN (PORCINE) IN NACL 100-0.45 UNIT/ML-% IJ SOLN
1050.0000 [IU]/h | INTRAMUSCULAR | Status: DC
  Administered 2014-03-17: 1050 [IU]/h via INTRAVENOUS
  Filled 2014-03-16: qty 250

## 2014-03-16 NOTE — Progress Notes (Signed)
Speech Language Pathology Daily Session Note  Patient Details  Name: Madison [name] MRN: [id number] Date of Birth: [birth year]  Today's Date: 03/16/2014 Time: 0915-1000 Time Calculation (min): 45 min  Short Term Goals: Week 1: SLP Short Term Goal 1 (Week 1): The patient will tolerate trials of D4 consistency with no overt s/s of aspiration and appropriate bolus manipulation.  SLP Short Term Goal 2 (Week 1): The patient will utilize compensatory memory strategies to recall new information, min assist.  SLP Short Term Goal 3 (Week 1): The patient will complete complex problem solving tasks with min assist.  SLP Short Term Goal 4 (Week 1): The patient will utilize compensatory speech strategies to increase speech intelligibility.   Skilled Therapeutic Interventions: Skilled treatment session focused on addressing cognition goals.  SLP facilitated session with a medication management task.  Patient required SLP to read external aid to her due to not having glasses and she required increased wait time to self-monitor and correct errors during task.  SLP also facilitated session with discussion regarding discharge and Supervision level verbal cues for anticipatory awareness.  Continue with current plan of care.     FIM:  Comprehension Comprehension Mode: Auditory Comprehension: 6-Follows complex conversation/direction: With extra time/assistive device Expression Expression: 5-Expresses complex 90% of the time/cues < 10% of the time Social Interaction Social Interaction: 6-Interacts appropriately with others with medication or extra time (anti-anxiety, antidepressant). Problem Solving Problem Solving: 5-Solves basic problems: With no assist Memory Memory: 5-Recognizes or recalls 90% of the time/requires cueing < 10% of the time  Pain Pain Assessment Pain Assessment: No/denies pain  Therapy/Group: Individual Therapy  Charlane Ferretti., CCC-SLP [phone]  [name] 03/16/2014, 12:28  PM

## 2014-03-16 NOTE — Progress Notes (Signed)
Subjective/Complaints: 53 y.o. right-handed female with history of diabetes mellitus, hypertension, CAD with CABG as well as aortic valve replacement on chronic Coumadin. Patient was independent prior to admission with ADLs and mobility. Admitted 03/08/2014 with altered mental status, hypoglycemia and acute respiratory failure. Patient was intubated in the emergency room. Noted blood sugar of 1124 as well as potassium 7.2. 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 INR on admission of 1.37. Echocardiogram and carotid Dopplers are pending. Critical medicine consulted for severe DKA metabolic acidosis hyperkalemia. Placed on intravenous fluids. Patient was extubated 03/09/2014  CBG low last noc Review of Systems - Negative except left side weak, prior stroke  Objective: Vital Signs: Blood pressure 118/73, pulse 65, temperature 98.2 F (36.8 C), temperature source Oral, resp. rate 18, height '5\' 3"'  (1.6 m), weight 69.7 kg (153 lb 10.6 oz), SpO2 97.00%. No results found. Results for orders placed during the hospital encounter of 03/12/14 (from the past 72 hour(s))  GLUCOSE, CAPILLARY     Status: Abnormal   Collection Time    03/13/14 12:20 PM      Result Value Ref Range   Glucose-Capillary 174 (*) 70 - 99 mg/dL   Comment 1 Notify RN    HEPARIN LEVEL (UNFRACTIONATED)     Status: None   Collection Time    03/13/14  1:07 PM      Result Value Ref Range   Heparin Unfractionated 0.42  0.30 - 0.70 IU/mL   Comment:            IF HEPARIN RESULTS ARE BELOW     EXPECTED VALUES, AND PATIENT     DOSAGE HAS BEEN CONFIRMED,     SUGGEST FOLLOW UP TESTING     OF ANTITHROMBIN III LEVELS.  CBC     Status: Abnormal   Collection Time    03/13/14  1:07 PM      Result Value Ref Range   WBC 6.7  4.0 - 10.5 K/uL   RBC 3.53 (*) 3.87 - 5.11 MIL/uL   Hemoglobin 10.0 (*) 12.0 - 15.0 g/dL   HCT 30.0 (*) 36.0 - 46.0 %   MCV 85.0   78.0 - 100.0 fL   MCH 28.3  26.0 - 34.0 pg   MCHC 33.3  30.0 - 36.0 g/dL   RDW 13.6  11.5 - 15.5 %   Platelets 167  150 - 400 K/uL  GLUCOSE, CAPILLARY     Status: Abnormal   Collection Time    03/13/14  4:18 PM      Result Value Ref Range   Glucose-Capillary 157 (*) 70 - 99 mg/dL   Comment 1 Notify RN    GLUCOSE, CAPILLARY     Status: None   Collection Time    03/13/14  7:56 PM      Result Value Ref Range   Glucose-Capillary 75  70 - 99 mg/dL  GLUCOSE, CAPILLARY     Status: Abnormal   Collection Time    03/13/14 11:56 PM      Result Value Ref Range   Glucose-Capillary 137 (*) 70 - 99 mg/dL  PROTIME-INR     Status: Abnormal   Collection Time    03/14/14  6:25 AM      Result Value Ref Range   Prothrombin Time 19.3 (*) 11.6 - 15.2 seconds   INR 1.68 (*) 0.00 - 1.49  HEPARIN LEVEL (UNFRACTIONATED)     Status:  None   Collection Time    03/14/14  6:25 AM      Result Value Ref Range   Heparin Unfractionated 0.53  0.30 - 0.70 IU/mL   Comment:            IF HEPARIN RESULTS ARE BELOW     EXPECTED VALUES, AND PATIENT     DOSAGE HAS BEEN CONFIRMED,     SUGGEST FOLLOW UP TESTING     OF ANTITHROMBIN III LEVELS.  CBC     Status: Abnormal   Collection Time    03/14/14  6:25 AM      Result Value Ref Range   WBC 5.6  4.0 - 10.5 K/uL   RBC 3.53 (*) 3.87 - 5.11 MIL/uL   Hemoglobin 10.0 (*) 12.0 - 15.0 g/dL   HCT 30.5 (*) 36.0 - 46.0 %   MCV 86.4  78.0 - 100.0 fL   MCH 28.3  26.0 - 34.0 pg   MCHC 32.8  30.0 - 36.0 g/dL   RDW 13.9  11.5 - 15.5 %   Platelets 183  150 - 400 K/uL  GLUCOSE, CAPILLARY     Status: Abnormal   Collection Time    03/14/14  7:12 AM      Result Value Ref Range   Glucose-Capillary 230 (*) 70 - 99 mg/dL   Comment 1 Notify RN    GLUCOSE, CAPILLARY     Status: Abnormal   Collection Time    03/14/14 11:33 AM      Result Value Ref Range   Glucose-Capillary 110 (*) 70 - 99 mg/dL   Comment 1 Notify RN    GLUCOSE, CAPILLARY     Status: Abnormal   Collection  Time    03/14/14  4:24 PM      Result Value Ref Range   Glucose-Capillary 217 (*) 70 - 99 mg/dL   Comment 1 Notify RN    GLUCOSE, CAPILLARY     Status: Abnormal   Collection Time    03/14/14  8:24 PM      Result Value Ref Range   Glucose-Capillary 226 (*) 70 - 99 mg/dL  CBC WITH DIFFERENTIAL     Status: Abnormal   Collection Time    03/15/14  6:02 AM      Result Value Ref Range   WBC 6.2  4.0 - 10.5 K/uL   RBC 3.52 (*) 3.87 - 5.11 MIL/uL   Hemoglobin 10.1 (*) 12.0 - 15.0 g/dL   HCT 30.5 (*) 36.0 - 46.0 %   MCV 86.6  78.0 - 100.0 fL   MCH 28.7  26.0 - 34.0 pg   MCHC 33.1  30.0 - 36.0 g/dL   RDW 14.2  11.5 - 15.5 %   Platelets 222  150 - 400 K/uL   Neutrophils Relative % 39 (*) 43 - 77 %   Neutro Abs 2.4  1.7 - 7.7 K/uL   Lymphocytes Relative 48 (*) 12 - 46 %   Lymphs Abs 3.0  0.7 - 4.0 K/uL   Monocytes Relative 8  3 - 12 %   Monocytes Absolute 0.5  0.1 - 1.0 K/uL   Eosinophils Relative 5  0 - 5 %   Eosinophils Absolute 0.3  0.0 - 0.7 K/uL   Basophils Relative 0  0 - 1 %   Basophils Absolute 0.0  0.0 - 0.1 K/uL  COMPREHENSIVE METABOLIC PANEL     Status: Abnormal   Collection Time    03/15/14  6:02 AM  Result Value Ref Range   Sodium 139  137 - 147 mEq/L   Potassium 4.2  3.7 - 5.3 mEq/L   Chloride 105  96 - 112 mEq/L   CO2 22  19 - 32 mEq/L   Glucose, Bld 242 (*) 70 - 99 mg/dL   BUN 10  6 - 23 mg/dL   Creatinine, Ser 0.72  0.50 - 1.10 mg/dL   Calcium 8.6  8.4 - 10.5 mg/dL   Total Protein 5.8 (*) 6.0 - 8.3 g/dL   Albumin 2.4 (*) 3.5 - 5.2 g/dL   AST 29  0 - 37 U/L   ALT 25  0 - 35 U/L   Alkaline Phosphatase 84  39 - 117 U/L   Total Bilirubin <0.2 (*) 0.3 - 1.2 mg/dL   GFR calc non Af Amer >90  >90 mL/min   GFR calc Af Amer >90  >90 mL/min   Comment: (NOTE)     The eGFR has been calculated using the CKD EPI equation.     This calculation has not been validated in all clinical situations.     eGFR's persistently <90 mL/min signify possible Chronic Kidney      Disease.  PROTIME-INR     Status: Abnormal   Collection Time    03/15/14  6:02 AM      Result Value Ref Range   Prothrombin Time 19.8 (*) 11.6 - 15.2 seconds   INR 1.74 (*) 0.00 - 1.49  HEPARIN LEVEL (UNFRACTIONATED)     Status: None   Collection Time    03/15/14  6:02 AM      Result Value Ref Range   Heparin Unfractionated 0.38  0.30 - 0.70 IU/mL   Comment:            IF HEPARIN RESULTS ARE BELOW     EXPECTED VALUES, AND PATIENT     DOSAGE HAS BEEN CONFIRMED,     SUGGEST FOLLOW UP TESTING     OF ANTITHROMBIN III LEVELS.  GLUCOSE, CAPILLARY     Status: Abnormal   Collection Time    03/15/14  7:21 AM      Result Value Ref Range   Glucose-Capillary 232 (*) 70 - 99 mg/dL   Comment 1 Notify RN    GLUCOSE, CAPILLARY     Status: None   Collection Time    03/15/14 11:19 AM      Result Value Ref Range   Glucose-Capillary 85  70 - 99 mg/dL  GLUCOSE, CAPILLARY     Status: Abnormal   Collection Time    03/15/14  4:32 PM      Result Value Ref Range   Glucose-Capillary 193 (*) 70 - 99 mg/dL   Comment 1 Notify RN    GLUCOSE, CAPILLARY     Status: Abnormal   Collection Time    03/15/14  9:17 PM      Result Value Ref Range   Glucose-Capillary 67 (*) 70 - 99 mg/dL  GLUCOSE, CAPILLARY     Status: None   Collection Time    03/15/14  9:55 PM      Result Value Ref Range   Glucose-Capillary 94  70 - 99 mg/dL  PROTIME-INR     Status: Abnormal   Collection Time    03/16/14  6:02 AM      Result Value Ref Range   Prothrombin Time 21.2 (*) 11.6 - 15.2 seconds   INR 1.90 (*) 0.00 - 1.49  HEPARIN LEVEL (UNFRACTIONATED)  Status: None   Collection Time    03/16/14  6:02 AM      Result Value Ref Range   Heparin Unfractionated 0.55  0.30 - 0.70 IU/mL   Comment:            IF HEPARIN RESULTS ARE BELOW     EXPECTED VALUES, AND PATIENT     DOSAGE HAS BEEN CONFIRMED,     SUGGEST FOLLOW UP TESTING     OF ANTITHROMBIN III LEVELS.  CBC     Status: Abnormal   Collection Time    03/16/14   6:02 AM      Result Value Ref Range   WBC 5.8  4.0 - 10.5 K/uL   RBC 3.62 (*) 3.87 - 5.11 MIL/uL   Hemoglobin 10.3 (*) 12.0 - 15.0 g/dL   HCT 31.3 (*) 36.0 - 46.0 %   MCV 86.5  78.0 - 100.0 fL   MCH 28.5  26.0 - 34.0 pg   MCHC 32.9  30.0 - 36.0 g/dL   RDW 14.2  11.5 - 15.5 %   Platelets 254  150 - 400 K/uL  GLUCOSE, CAPILLARY     Status: Abnormal   Collection Time    03/16/14  7:38 AM      Result Value Ref Range   Glucose-Capillary 217 (*) 70 - 99 mg/dL   Comment 1 Notify RN       HEENT: normal Cardio: RRR and no murmur Resp: CTA B/L and unlabored GI: BS positive and ND Extremity:  Pulses positive and No Edema Skin:   Intact Neuro: Alert/Oriented, Cranial Nerve Abnormalities Left central 7, Abnormal Motor 3-/5 Left delt bi, tri, grip, HF, KE ADF, Abnormal FMC Ataxic/ dec FMC and Tone  Hypertonia, Tone:  left side increased flexor tone in UE and Dysarthric Musc/Skel:  Normal Gen NAD   Assessment/Plan: 1. Functional deficits secondary to acute /subacute anterior Right  frontal ischemic stroke which require 3+ hours per day of interdisciplinary therapy in a comprehensive inpatient rehab setting. Physiatrist is providing close team supervision and 24 hour management of active medical problems listed below. Physiatrist and rehab team continue to assess barriers to discharge/monitor patient progress toward functional and medical goals. FIM: FIM - Bathing Bathing Steps Patient Completed: Chest;Right Arm;Left Arm;Abdomen;Front perineal area;Buttocks;Right upper leg;Right lower leg (including foot);Left lower leg (including foot) Bathing: 4: Steadying assist  FIM - Upper Body Dressing/Undressing Upper body dressing/undressing steps patient completed: Thread/unthread right sleeve of pullover shirt/dresss;Thread/unthread left sleeve of pullover shirt/dress;Put head through opening of pull over shirt/dress;Pull shirt over trunk Upper body dressing/undressing: 5: Set-up assist to: Obtain  clothing/put away FIM - Lower Body Dressing/Undressing Lower body dressing/undressing steps patient completed: Thread/unthread right underwear leg;Thread/unthread left underwear leg;Pull underwear up/down;Thread/unthread right pants leg;Thread/unthread left pants leg;Pull pants up/down Lower body dressing/undressing: 5: Set-up assist to: Obtain clothing  FIM - Toileting Toileting steps completed by patient: Adjust clothing prior to toileting;Performs perineal hygiene;Adjust clothing after toileting Toileting Assistive Devices: Grab bar or rail for support Toileting: 4: Steadying assist  FIM - Radio producer Devices: Elevated toilet seat;Grab bars Toilet Transfers: 5-To toilet/BSC: Supervision (verbal cues/safety issues);5-From toilet/BSC: Supervision (verbal cues/safety issues)  FIM - Control and instrumentation engineer Devices: Bed rails Bed/Chair Transfer: 5: Supine > Sit: Supervision (verbal cues/safety issues);5: Sit > Supine: Supervision (verbal cues/safety issues);4: Chair or W/C > Bed: Min A (steadying Pt. > 75%);4: Bed > Chair or W/C: Min A (steadying Pt. > 75%)  FIM - Locomotion: Wheelchair  Locomotion: Wheelchair: 0: Activity did not occur FIM - Locomotion: Ambulation Locomotion: Ambulation Assistive Devices: Administrator Ambulation/Gait Assistance: 5: Supervision;4: Min guard Locomotion: Ambulation: 4: Travels 150 ft or more with minimal assistance (Pt.>75%)  Comprehension Comprehension Mode: Auditory Comprehension: 6-Follows complex conversation/direction: With extra time/assistive device  Expression Expression Mode: Verbal Expression: 5-Expresses complex 90% of the time/cues < 10% of the time  Social Interaction Social Interaction: 6-Interacts appropriately with others with medication or extra time (anti-anxiety, antidepressant).  Problem Solving Problem Solving: 5-Solves basic problems: With no assist  Memory Memory:  5-Recognizes or recalls 90% of the time/requires cueing < 10% of the time  Medical Problem List and Plan:  1. 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 DVT Prophylaxis/Anticoagulation: Heparin Coumadin therapy with history of aortic valve replacement. INR goal 2.5-3.5.  3. Pain Management: Tylenol as needed.  4. Neuropsych: This patient is capable of making decisions on her own behalf.  5. Diabetes mellitus with peripheral neuropathy. Hemoglobin A1c pending. Levemir 10 units daily. Check blood sugars a.c. and at bedtime  6. CAD with CABG. Aspirin 81 mg daily. No chest pain or shortness of breath  7. Hyperlipidemia. Zocor  8. Insomnia.Rozerem    LOS (Days) 4 A FACE TO FACE EVALUATION WAS PERFORMED  Charlett Blake 03/16/2014, 7:41 AM

## 2014-03-16 NOTE — Progress Notes (Signed)
Physical Therapy Session Note  Patient Details  Name: Madison [name] MRN: [id number] Date of Birth: 20-Feb-1961  Today's Date: 03/16/2014 Time: 1133-1203 Time Calculation (min): 30 min  Short Term Goals: Week 1:  PT Short Term Goal 1 (Week 1): =LTG secondary to short LOS  Skilled Therapeutic Interventions/Progress Updates:   Pt received sitting in arm chair in room, making phone call to care giver.  Pt agreeable to therapy and states that she needs to check on her clothes that were placed in dryer earlier.  Pt ambulated with RW at supervision level to laundry room.  Assisted pt with opening door and propping door with dryer door while she was able to bend into dryer to retrieve clothing.  Note clothes were still damp, therefore she placed them back into dryer and started dryer back up and then retrieved in this manner at end of session.  Ambulated to/from gym as stated above.  Min cues for safety when negotiating around obstacles and to keep RW closer to her with gait.  Once in gym, worked on dynamic balance while standing on balance beam at min/guard level while retrieving and placing yellow clothes pins to metal pole with use of LUE to work on fine motor skills and ROM in LUE.  Pt ambulated back to laundry room and her room as stated above.  Pt left in w/c with all needs in reach.   Therapy Documentation Precautions:  Precautions Precautions: Fall Restrictions Weight Bearing Restrictions: No   Vital Signs:   Pain: Pain Assessment Pain Assessment: No/denies pain  See FIM for current functional status  Therapy/Group: Individual Therapy  Irving Burton A Earleen Aoun 03/16/2014, 12:24 PM

## 2014-03-16 NOTE — Progress Notes (Signed)
ANTICOAGULATION CONSULT NOTE - Follow up  Pharmacy Consult for Heparin and Coumadin Indication: h/o AVR  Allergies  Allergen Reactions  . Celebrex [Celecoxib] Rash  . Detrol [Tolterodine]     Patient Measurements: Height: 5\' 3"  (160 cm) Weight: 153 lb 10.6 oz (69.7 kg) IBW/kg (Calculated) : 52.4 Heparin Dosing Weight: 66.8 kg  Vital Signs: Temp: 98.2 F (36.8 C) (04/28 0537) Temp src: Oral (04/28 0537) BP: 118/73 mmHg (04/28 0537) Pulse Rate: 65 (04/28 0537)  Labs:  Recent Labs  03/14/14 0625 03/15/14 0602 03/16/14 0602  HGB 10.0* 10.1* 10.3*  HCT 30.5* 30.5* 31.3*  PLT 183 222 254  LABPROT 19.3* 19.8* 21.2*  INR 1.68* 1.74* 1.90*  HEPARINUNFRC 0.53 0.38 0.55  CREATININE  --  0.72  --     Estimated Creatinine Clearance: 76.1 ml/min (by C-G formula based on Cr of 0.72).   Medical History: Past Medical History  Diagnosis Date  . CAD (coronary artery disease)   . Obesity   . Hypercholesteremia   . HTN (hypertension)   . Dyslipidemia   . Stroke syndrome   . Aortic stenosis   . Thrombophlebitis   . Diabetes   . Hyperlipidemia   . Rheumatoid arthritis(714.0)     Medications:  Prescriptions prior to admission  Medication Sig Dispense Refill  . aspirin 81 MG tablet Take 81 mg by mouth daily.      . baclofen (LIORESAL) 10 MG tablet Take 1 tablet (10 mg total) by mouth every morning.  90 each  0  . Doxepin HCl (SILENOR) 6 MG TABS Take 6 mg by mouth at bedtime.      03/18/14 escitalopram (LEXAPRO) 5 MG tablet Take 5 mg by mouth daily.      . heparin 100-0.45 UNIT/ML-% infusion Inject 1,050 Units/hr into the vein continuous.  250 mL  1  . insulin aspart (NOVOLOG) 100 UNIT/ML injection Inject 0-15 Units into the skin 3 (three) times daily with meals.  10 mL  11  . insulin aspart protamine- aspart (NOVOLOG MIX 70/30) (70-30) 100 UNIT/ML injection Inject 0.2 mLs (20 Units total) into the skin 2 (two) times daily with a meal.  10 mL  11  . omeprazole (PRILOSEC) 40 MG  capsule Take 1 capsule (40 mg total) by mouth daily.  90 capsule  3  . pantoprazole (PROTONIX) 40 MG tablet Take 1 tablet (40 mg total) by mouth daily at 12 noon.  30 tablet  0  . pravastatin (PRAVACHOL) 20 MG tablet Take 1 tablet (20 mg total) by mouth daily.  90 tablet  0  . ramelteon (ROZEREM) 8 MG tablet Take 1 tablet (8 mg total) by mouth at bedtime.  30 tablet  0  . solifenacin (VESICARE) 5 MG tablet Take 5 mg by mouth daily.      01-18-1982 warfarin (COUMADIN) 5 MG tablet Take 5 mg by mouth daily.      Marland Kitchen warfarin (COUMADIN) 7.5 MG tablet Take 1 tablet (7.5 mg total) by mouth one time only at 6 PM.  30 tablet  0   Scheduled:  . aspirin  81 mg Oral Daily  . baclofen  10 mg Oral BID  . darifenacin  7.5 mg Oral Daily  . escitalopram  5 mg Oral Daily  . insulin aspart  0-15 Units Subcutaneous TID WC  . insulin aspart protamine- aspart  20 Units Subcutaneous BID WC  . pantoprazole  40 mg Oral Q1200  . ramelteon  8 mg Oral QHS  . simvastatin  10 mg Oral q1800  . Warfarin - Pharmacist Dosing Inpatient   Does not apply q1800   Infusions:  . heparin 1,100 Units/hr (03/16/14 0231)    Assessment: 71 YOF s/p CVA and NSTEMI continuing on Coumadin and IV heparin bridge for history of AVR.  Heparin level = 0.55 on 1100 units/hr IV heparin. INR = 1.9, increasing toward goal INR 2.5-3.5.   Heme: Hgb 10.3 and Plt 254 K stable No bleeding noted.   PTA coumadin dose (per anticoag office visit on 03/01/14) was 5mg  qMWF and 2.5mg  qTTSS. Patient reported PTA dose was 5 mg daily.  Anticoag office visit notes on 03/01/14 indicates possibility of missed doses. Admit INR on 03/08/14 was 1.37, SUBtherapeutic on admit.  Since 4/24 she has received higher doses of 7.5mg , 7.5 mg, 7.5 mg and 10mg  given yesterday 4/27.  INR is gradually increasing toward goal 2.5-3.5.  No drug interactions noted except Aspirin 81 mg daily (increased bleeding risk).     Goal of Therapy:  Heparin level: 0.3-0.5 units/mL INR 2.5-3.5 per  Neuro Monitor platelets by anticoagulation protocol: Yes   Plan:  Decrease IV heparin rate slightly to 1050 units/hr Give Coumadin 7.5 mg today.  Daily heparin level, CBC and INR.    , RPh Clinical Pharmacist Pager: [phone] 03/16/2014,9:48 AM

## 2014-03-16 NOTE — Progress Notes (Signed)
Physical Therapy Session Note  Patient Details  Name: Madison [name] MRN: [id number] Date of Birth: February 18, 1961  Today's Date: 03/16/2014 Time: 0076-2263 Time Calculation (min): 57 min  Short Term Goals: Week 1:  PT Short Term Goal 1 (Week 1): =LTG secondary to short LOS  Skilled Therapeutic Interventions/Progress Updates:   Pt received lying in bed, agreeable to therapy.  Communicated with RN regarding disconnecting IV in order to perform stairs and floor transfer.  RN states unable at this time due to receiving Heparin.  Pt ambulated to/from gym with RW at supervision level.  Ambulated to ADL apt in order to work on dynamic balance and high level balance while making up bed to increase weight shifting, balance and L UE with bimanual tasks.  She was able to perform at supervision level with very min assist for pillow cases.  Ambulated back to therapy gym as stated above.  Performed stairs 6, 4" steps and 4, 6" steps with R handrail to simulate home entry at supervision level with mod cues for step to technique for increased safety.  Progressed to performing high level gait without AD while kicking yoga block in order to work on increased weight shift and WB through LLE and increased hip strength.  Performed 50' x 2 reps at min/guard assist level with intermittent LOB to the R, however pt able to self correct.  Ended session with seated nustep x 5 mins with BLEs only for increased BLE strength and also for increase NMR to LLE.  Ambulated back to room with SPC at min/guard level with cues for sequencing and technique and to maintain upright posture throughout.  Would recommend that pt continue to use RW, as she is more balanced with RW vs SPC at this time.  Pt in agreement.   Pt returned to bed with all needs in reach and bed alarm set.    Therapy Documentation Precautions:  Precautions Precautions: Fall Restrictions Weight Bearing Restrictions: No Therapy Vitals Temp: 98.7 F (37.1 C) Temp src:  Oral Pulse Rate: 62 Resp: 20 BP: 120/78 mmHg Patient Position, if appropriate: Lying Oxygen Therapy SpO2: 99 % O2 Device: None (Room air) Pain: Pain Assessment Pain Assessment: No/denies pain   Locomotion : Ambulation Ambulation/Gait Assistance: 5: Supervision   See FIM for current functional status  Therapy/Group: Individual Therapy  Irving Burton A Francheska Villeda 03/16/2014, 4:05 PM

## 2014-03-16 NOTE — Progress Notes (Signed)
Occupational Therapy Session Note  Patient Details  Name: Nickayla Mcinnis MRN: [id number] Date of Birth: Dec 26, 1960  Today's Date: 03/16/2014 Time: 1133-1203 Time Calculation (min): 30 min  Short Term Goals: Week 1:  OT Short Term Goal 1 (Week 1): STGs equal to LTGs set at overall modified independent level.  Skilled Therapeutic Interventions/Progress Updates:    Pt performed bathing, dressing, and toileting during session.  She was able to ambulate to the toilet with supervision and then returned back to the wheelchair at the sink.  She was able to perform all bathing and dressing with overall supervision but needed min instructional cueing to integrate the LUE into more functional tasks.  Once she finished dressing therapist had her ambulate up to the pt laundry room with supervision using the walker to place her clothing in the dryer from the washer with close supervision.  Upon returning to the room therapist issued foam block cut into pieces for her to work on placing into cup with better quality of movement in the shoulder and for elbow flexion.  Pt tends to move in a synergy pattern with shoulder hike and increased elbow flexion.    Therapy Documentation Precautions:  Precautions Precautions: Fall Restrictions Weight Bearing Restrictions: No  Pain: Pain Assessment Pain Assessment: No/denies pain ADL: See FIM for current functional status  Therapy/Group: Individual Therapy  Delon Sacramento OTR/L 03/16/2014, 12:24 PM

## 2014-03-16 NOTE — Progress Notes (Signed)
Inpatient Diabetes Program Recommendations  AACE/ADA: New Consensus Statement on Inpatient Glycemic Control (2013)  Target Ranges:  Prepandial:   less than 140 mg/dL      Peak postprandial:   less than 180 mg/dL (1-2 hours)      Critically ill patients:  140 - 180 mg/dL   Reason for Assessment: Hypoglycemia  Diabetes history: Insulin requiring diabetes Outpatient Diabetes medications: 70/30 20 units bid, Novolog correction Current orders for Inpatient glycemic control: 70/30 20 units bid, Moderate Novolog Correction   Results for KAEDYNCE, TAPP (MRN [id number]) as of 03/16/2014 14:23  Ref. Range 03/15/2014 07:21 03/15/2014 11:19 03/15/2014 16:32 03/15/2014 21:17 03/15/2014 21:55 03/16/2014 07:38 03/16/2014 11:27  Glucose-Capillary Latest Range: 70-99 mg/dL 654 (H) 85 650 (H) 67 (L) 94 217 (H) 190 (H)   Note:  Request MD consider changing Novolog correction scale from moderate to sensitive.  Thank you.  Valaree Fresquez S. Elsie Lincoln, RN, CNS, CDE Inpatient Diabetes Program, team pager [phone]

## 2014-03-17 ENCOUNTER — Inpatient Hospital Stay (HOSPITAL_COMMUNITY): Payer: Medicare HMO | Admitting: Speech Pathology

## 2014-03-17 ENCOUNTER — Encounter (HOSPITAL_COMMUNITY): Payer: Commercial Managed Care - HMO | Admitting: Occupational Therapy

## 2014-03-17 ENCOUNTER — Inpatient Hospital Stay (HOSPITAL_COMMUNITY): Payer: Commercial Managed Care - HMO | Admitting: Rehabilitation

## 2014-03-17 ENCOUNTER — Inpatient Hospital Stay (HOSPITAL_COMMUNITY): Payer: Commercial Managed Care - HMO | Admitting: Occupational Therapy

## 2014-03-17 LAB — GLUCOSE, CAPILLARY
GLUCOSE-CAPILLARY: 159 mg/dL — AB (ref 70–99)
GLUCOSE-CAPILLARY: 207 mg/dL — AB (ref 70–99)
Glucose-Capillary: 145 mg/dL — ABNORMAL HIGH (ref 70–99)
Glucose-Capillary: 173 mg/dL — ABNORMAL HIGH (ref 70–99)

## 2014-03-17 LAB — PROTIME-INR
INR: 1.99 — AB (ref 0.00–1.49)
Prothrombin Time: 22 seconds — ABNORMAL HIGH (ref 11.6–15.2)

## 2014-03-17 LAB — CBC
HEMATOCRIT: 30.9 % — AB (ref 36.0–46.0)
Hemoglobin: 10 g/dL — ABNORMAL LOW (ref 12.0–15.0)
MCH: 28.2 pg (ref 26.0–34.0)
MCHC: 32.4 g/dL (ref 30.0–36.0)
MCV: 87.3 fL (ref 78.0–100.0)
Platelets: 288 10*3/uL (ref 150–400)
RBC: 3.54 MIL/uL — ABNORMAL LOW (ref 3.87–5.11)
RDW: 14.4 % (ref 11.5–15.5)
WBC: 5.6 10*3/uL (ref 4.0–10.5)

## 2014-03-17 LAB — HEPARIN LEVEL (UNFRACTIONATED)
HEPARIN UNFRACTIONATED: 0.58 [IU]/mL (ref 0.30–0.70)
Heparin Unfractionated: 0.41 IU/mL (ref 0.30–0.70)

## 2014-03-17 MED ORDER — WARFARIN SODIUM 10 MG PO TABS
10.0000 mg | ORAL_TABLET | Freq: Once | ORAL | Status: AC
  Administered 2014-03-17: 10 mg via ORAL
  Filled 2014-03-17: qty 1

## 2014-03-17 MED ORDER — HEPARIN (PORCINE) IN NACL 100-0.45 UNIT/ML-% IJ SOLN
900.0000 [IU]/h | INTRAMUSCULAR | Status: DC
  Administered 2014-03-17 – 2014-03-19 (×3): 950 [IU]/h via INTRAVENOUS
  Administered 2014-03-20: 900 [IU]/h via INTRAVENOUS
  Filled 2014-03-17 (×4): qty 250

## 2014-03-17 NOTE — Plan of Care (Signed)
Problem: Food- and Nutrition-Related Knowledge Deficit (NB-1.1) Goal: Nutrition education Formal process to instruct or train a patient/client in a skill or to impart knowledge to help patients/clients voluntarily manage or modify food choices and eating behavior to maintain or improve health. Outcome: Progressing  RD consulted for nutrition education regarding diabetes.     Lab Results  Component Value Date    HGBA1C 11.7* 03/11/2014    RD provided "Carbohydrate Counting for People with Diabetes" handout from the Academy of [employer] Dietetics. Pt working with PT at time of visit. RD left handout for pt and will plan to return as able to review.   Body mass index is 26.56 kg/(m^2). Pt meets criteria for overweight based on current BMI.  Current diet order is CHO Mod Med, patient is consuming approximately 100% of meals at this time. Labs and medications reviewed. No further nutrition interventions warranted at this time. RD contact information provided. If additional nutrition issues arise, please re-consult RD.  Loyce Dys, MS RD LDN Clinical Inpatient Dietitian Pager: [phone] Weekend/After hours pager: [phone]

## 2014-03-17 NOTE — Progress Notes (Signed)
Speech Language Pathology Daily Session Note  Patient Details  Name: Dianey Suchy MRN: [id number] Date of Birth: 10-Mar-1961  Today's Date: 03/17/2014 Time: 2297-9892 Time Calculation (min): 42 min  Short Term Goals: Week 1: SLP Short Term Goal 1 (Week 1): The patient will tolerate trials of D4 consistency with no overt s/s of aspiration and appropriate bolus manipulation.  SLP Short Term Goal 2 (Week 1): The patient will utilize compensatory memory strategies to recall new information, min assist.  SLP Short Term Goal 3 (Week 1): The patient will complete complex problem solving tasks with min assist.  SLP Short Term Goal 4 (Week 1): The patient will utilize compensatory speech strategies to increase speech intelligibility.   Skilled Therapeutic Interventions: Skilled treatment session focused on addressing cognition goals.  SLP facilitated session with new learning task which patient recalled procedures for and completed with Mod I.  Patient required Supervision question cues to anticipate needs following discharge and modifications needed for household management.   Continue with current plan of care.     FIM:  Comprehension Comprehension Mode: Auditory Comprehension: 6-Follows complex conversation/direction: With extra time/assistive device Expression Expression Mode: Verbal Expression: 6-Expresses complex ideas: With extra time/assistive device Social Interaction Social Interaction: 6-Interacts appropriately with others with medication or extra time (anti-anxiety, antidepressant). Problem Solving Problem Solving: 5-Solves complex 90% of the time/cues < 10% of the time Memory Memory: 6-Assistive device: No helper  Pain Pain Assessment Pain Assessment: No/denies pain  Therapy/Group: Individual Therapy  Charlane Ferretti., CCC-SLP [phone]  [name] 03/17/2014, 4:50 PM

## 2014-03-17 NOTE — Progress Notes (Signed)
Physical Therapy Session Note  Patient Details  Name: Madison [name] MRN: [id number] Date of Birth: 1960/12/13  Today's Date: 03/17/2014 Time: 1501-1600 Time Calculation (min): 59 min  Short Term Goals: Week 1:  PT Short Term Goal 1 (Week 1): =LTG secondary to short LOS  Skilled Therapeutic Interventions/Progress Updates:   Pt received lying in bed, agreeable to therapy.  Discussed D/C date and reason for having to stay over weekend.  Pt concerned with having someone care for her child over the weekend.  Pt ambulated to/from gym with RW at supervision level.  Continue to provide min cues for upright posture and keeping RW with her when turning to sit.  Ambulated down to ADL apt in order to work on fall recovery from floor.  Pt able to perform at min assist level for getting hips elevated back into tall kneeling position.  Ambulated back to therapy gym and worked on high level balance with tapping to cones with focus on slow controlled movements for increased WB through LLE.  Also worked on tapping cones over, placing back upright with alternating LEs while on mat/compliant surface to further challenge balance.  Requires min assist for both activities to maintain balance.  No overt LOB noted during activity.  Pt then performed seated nustep x 5 mins at level 4 resistance using BUE/LEs in order to increase NMR through LUE/LE.  Tolerated well and was able to keep 40 steps per min.  Ambulated down to day room in order to work on watering plants to work on high level balance with weight shifting while managing water pitcher.  She was able to perform at supervision level.   Then had pt stand x 3-4 mins while removing dead areas of plants while reaching and weight shifting.  Ambulated back to room and used restroom at distant supervision level.  Returned to EOB while RT and pet therapist/dog entered room to visit with pt.  All needs in reach and RT to assist pt into bed.    Therapy Documentation Precautions:   Precautions Precautions: Fall Restrictions Weight Bearing Restrictions: No   Vital Signs: Therapy Vitals Temp: 98.2 F (36.8 C) Temp src: Oral Pulse Rate: 64 Resp: 18 BP: 101/64 mmHg Patient Position, if appropriate: Sitting Oxygen Therapy SpO2: 100 % O2 Device: None (Room air) Pain: Pt with no pain this afternoon.  Pain Assessment Pain Assessment: No/denies pain     See FIM for current functional status  Therapy/Group: Individual Therapy  Lyman Speller Bohdi Leeds 03/17/2014, 4:23 PM

## 2014-03-17 NOTE — Plan of Care (Signed)
Problem: RH Stairs Goal: LTG Patient will ambulate up and down stairs w/assist (PT) LTG: Patient will ambulate up and down # of stairs with assistance (PT)  Pts ambulation and stair goals upgraded to Mod I, as she is at supervision level now and will need to be Mod I at time of D/C as she will only have intermittent assist from aide at home.

## 2014-03-17 NOTE — Patient Care Conference (Signed)
Inpatient RehabilitationTeam Conference and Plan of Care Update Date: 03/17/2014   Time: 11;20 AM    Patient Name: Madison [name]      Medical Record Number: [id number]  Date of Birth: [birth year] Sex: Female         Room/Bed: 4W05C/4W05C-01 Payor Info: Payor: MEDICAID [place] / Plan: MEDICAID [place] / Product Type: *No Product type* /    Admitting Diagnosis: RT CVA  Admit Date/Time:  03/12/2014  3:31 PM Admission Comments: No comment available   Primary Diagnosis:  <principal problem not specified> Principal Problem: <principal problem not specified>  Patient Active Problem List   Diagnosis Date Noted  . AKI (acute kidney injury) 03/12/2014  . CVA (cerebral infarction) 03/12/2014  . Acute respiratory failure 03/12/2014  . DKA (diabetic ketoacidoses) 03/08/2014  . Septic shock(785.52) 03/08/2014  . Severe sepsis(995.92) 03/08/2014  . Metabolic acidosis 03/08/2014  . Encounter for therapeutic drug monitoring 12/31/2013  . Pain in joint, ankle and foot 09/30/2013  . Type I (juvenile type) diabetes mellitus with neurological manifestations, not stated as uncontrolled 07/31/2013  . Chronic anticoagulation 07/28/2013  . S/P AVR 07/28/2013  . Long term (current) use of anticoagulants 07/28/2013  . Aortic valve disorders 07/28/2013  . CAD (coronary artery disease)   . Obesity   . Hypercholesteremia   . HTN (hypertension)   . Dyslipidemia   . Stroke syndrome   . Aortic stenosis   . Thrombophlebitis   . Diabetes   . Hyperlipidemia   . Rheumatoid arthritis(714.0)     Expected Discharge Date: Expected Discharge Date: 03/22/14  Team Members Present: Physician leading conference: Dr. Claudette Laws Social Worker Present: Staci Acosta, LCSW;Becky Braeton Wolgamott, LCSW Nurse Present: Carlean Purl, RN PT Present: Harriet Butte, Varney Biles, PT OT Present: Perrin Maltese, Felipa Eth, OT SLP Present: Fae Pippin, SLP PPS Coordinator present : Edson Snowball, Chapman Fitch, RN, CRRN   Current Status/Progress Goal Weekly Team Focus  Medical   Patient with chronic left hemiparesis  Maintain medical stability  Pharmacy protocol for Coumadin, still on IV heparin goal to discontinue and then discontinue central line   Bowel/Bladder   Continent of Bowel and bladder but has episodes of urgency  Remains continent  Toileting with mini assist   Swallow/Nutrition/ Hydration   regular and thin with intermittent supervision   least restrictive PO intake   monitor toleration    ADL's   supervision level for all bathing, dressing, and grooming.  Supervision for toilet transfers using the RW.  Overall modified independent level  selfcare re-training, transfer training, LUE functional use and exercise. pt/family education   Mobility   Pt is currently supervision overall for all mobility and is safest with use of RW with gait.   supervision overall  high level balance, gait, pt/family education   Communication     na        Safety/Cognition/ Behavioral Observations  Supervision   Mod I   increase use of external aids, self-monitoring and correcting    Pain   No C/O pain. Pt denies pain  Remains pain free. Keep pain level at 3 or less  Assess for pain and intervene appropriiately   Skin   No Skin issues at this time  No skin breakdown  Assess skin q shift and PRN      *See Care Plan and progress notes for long and short-term goals.  Barriers to Discharge: See above    Possible Resolutions to Barriers:  See above    Discharge Planning/Teaching Needs:  Home with CAP worker and sister assisting with some of her care.  Wants to be mod/i to be able to stay alone at night      Team Discussion:  Making good progress-medical INR awaiting level.  Bladder urgency.  Once level reached can dc central line.  Revisions to Treatment Plan:  None   Continued Need for Acute Rehabilitation Level of Care: The patient requires daily medical management by a physician with specialized training  in physical medicine and rehabilitation for the following conditions: Daily direction of a multidisciplinary physical rehabilitation program to ensure safe treatment while eliciting the highest outcome that is of practical value to the patient.: Yes Daily medical management of patient stability for increased activity during participation in an intensive rehabilitation regime.: Yes Daily analysis of laboratory values and/or radiology reports with any subsequent need for medication adjustment of medical intervention for : Neurological problems;Other  Lemar Livings Delanna Blacketer 03/17/2014, 2:26 PM

## 2014-03-17 NOTE — Progress Notes (Signed)
Subjective/Complaints: 53 y.o. right-handed female with history of diabetes mellitus, hypertension, CAD with CABG as well as aortic valve replacement on chronic Coumadin. Patient was independent prior to admission with ADLs and mobility. Admitted 03/08/2014 with altered mental status, hypoglycemia and acute respiratory failure. Patient was intubated in the emergency room. Noted blood sugar of 1124 as well as potassium 7.2. 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 INR on admission of 1.37. Echocardiogram and carotid Dopplers are pending. Critical medicine consulted for severe DKA metabolic acidosis hyperkalemia. Placed on intravenous fluids. Patient was extubated 03/09/2014  Slept ok  Review of Systems - Negative except left side weak, prior stroke  Objective: Vital Signs: Blood pressure 100/61, pulse 63, temperature 98.4 F (36.9 C), temperature source Oral, resp. rate 18, height 5' 3" (1.6 m), weight 69.6 kg (153 lb 7 oz), SpO2 97.00%. No results found. Results for orders placed during the hospital encounter of 03/12/14 (from the past 72 hour(s))  GLUCOSE, CAPILLARY     Status: Abnormal   Collection Time    03/14/14 11:33 AM      Result Value Ref Range   Glucose-Capillary 110 (*) 70 - 99 mg/dL   Comment 1 Notify RN    GLUCOSE, CAPILLARY     Status: Abnormal   Collection Time    03/14/14  4:24 PM      Result Value Ref Range   Glucose-Capillary 217 (*) 70 - 99 mg/dL   Comment 1 Notify RN    GLUCOSE, CAPILLARY     Status: Abnormal   Collection Time    03/14/14  8:24 PM      Result Value Ref Range   Glucose-Capillary 226 (*) 70 - 99 mg/dL  CBC WITH DIFFERENTIAL     Status: Abnormal   Collection Time    03/15/14  6:02 AM      Result Value Ref Range   WBC 6.2  4.0 - 10.5 K/uL   RBC 3.52 (*) 3.87 - 5.11 MIL/uL   Hemoglobin 10.1 (*) 12.0 - 15.0 g/dL   HCT 30.5 (*) 36.0 - 46.0 %   MCV 86.6  78.0 -  100.0 fL   MCH 28.7  26.0 - 34.0 pg   MCHC 33.1  30.0 - 36.0 g/dL   RDW 14.2  11.5 - 15.5 %   Platelets 222  150 - 400 K/uL   Neutrophils Relative % 39 (*) 43 - 77 %   Neutro Abs 2.4  1.7 - 7.7 K/uL   Lymphocytes Relative 48 (*) 12 - 46 %   Lymphs Abs 3.0  0.7 - 4.0 K/uL   Monocytes Relative 8  3 - 12 %   Monocytes Absolute 0.5  0.1 - 1.0 K/uL   Eosinophils Relative 5  0 - 5 %   Eosinophils Absolute 0.3  0.0 - 0.7 K/uL   Basophils Relative 0  0 - 1 %   Basophils Absolute 0.0  0.0 - 0.1 K/uL  COMPREHENSIVE METABOLIC PANEL     Status: Abnormal   Collection Time    03/15/14  6:02 AM      Result Value Ref Range   Sodium 139  137 - 147 mEq/L   Potassium 4.2  3.7 - 5.3 mEq/L   Chloride 105  96 - 112 mEq/L   CO2 22  19 - 32 mEq/L   Glucose, Bld 242 (*) 70 - 99 mg/dL   BUN 10  6 - 23 mg/dL   Creatinine, Ser 0.72  0.50 - 1.10 mg/dL   Calcium 8.6  8.4 - 10.5 mg/dL   Total Protein 5.8 (*) 6.0 - 8.3 g/dL   Albumin 2.4 (*) 3.5 - 5.2 g/dL   AST 29  0 - 37 U/L   ALT 25  0 - 35 U/L   Alkaline Phosphatase 84  39 - 117 U/L   Total Bilirubin <0.2 (*) 0.3 - 1.2 mg/dL   GFR calc non Af Amer >90  >90 mL/min   GFR calc Af Amer >90  >90 mL/min   Comment: (NOTE)     The eGFR has been calculated using the CKD EPI equation.     This calculation has not been validated in all clinical situations.     eGFR's persistently <90 mL/min signify possible Chronic Kidney     Disease.  PROTIME-INR     Status: Abnormal   Collection Time    03/15/14  6:02 AM      Result Value Ref Range   Prothrombin Time 19.8 (*) 11.6 - 15.2 seconds   INR 1.74 (*) 0.00 - 1.49  HEPARIN LEVEL (UNFRACTIONATED)     Status: None   Collection Time    03/15/14  6:02 AM      Result Value Ref Range   Heparin Unfractionated 0.38  0.30 - 0.70 IU/mL   Comment:            IF HEPARIN RESULTS ARE BELOW     EXPECTED VALUES, AND PATIENT     DOSAGE HAS BEEN CONFIRMED,     SUGGEST FOLLOW UP TESTING     OF ANTITHROMBIN III LEVELS.   GLUCOSE, CAPILLARY     Status: Abnormal   Collection Time    03/15/14  7:21 AM      Result Value Ref Range   Glucose-Capillary 232 (*) 70 - 99 mg/dL   Comment 1 Notify RN    GLUCOSE, CAPILLARY     Status: None   Collection Time    03/15/14 11:19 AM      Result Value Ref Range   Glucose-Capillary 85  70 - 99 mg/dL  GLUCOSE, CAPILLARY     Status: Abnormal   Collection Time    03/15/14  4:32 PM      Result Value Ref Range   Glucose-Capillary 193 (*) 70 - 99 mg/dL   Comment 1 Notify RN    GLUCOSE, CAPILLARY     Status: Abnormal   Collection Time    03/15/14  9:17 PM      Result Value Ref Range   Glucose-Capillary 67 (*) 70 - 99 mg/dL  GLUCOSE, CAPILLARY     Status: None   Collection Time    03/15/14  9:55 PM      Result Value Ref Range   Glucose-Capillary 94  70 - 99 mg/dL  PROTIME-INR     Status: Abnormal   Collection Time    03/16/14  6:02 AM      Result Value Ref Range   Prothrombin Time 21.2 (*) 11.6 - 15.2 seconds   INR 1.90 (*) 0.00 - 1.49  HEPARIN LEVEL (UNFRACTIONATED)     Status: None   Collection Time    03/16/14  6:02 AM      Result Value Ref Range   Heparin Unfractionated 0.55  0.30 - 0.70 IU/mL   Comment:            IF HEPARIN RESULTS ARE BELOW  EXPECTED VALUES, AND PATIENT     DOSAGE HAS BEEN CONFIRMED,     SUGGEST FOLLOW UP TESTING     OF ANTITHROMBIN III LEVELS.  CBC     Status: Abnormal   Collection Time    03/16/14  6:02 AM      Result Value Ref Range   WBC 5.8  4.0 - 10.5 K/uL   RBC 3.62 (*) 3.87 - 5.11 MIL/uL   Hemoglobin 10.3 (*) 12.0 - 15.0 g/dL   HCT 31.3 (*) 36.0 - 46.0 %   MCV 86.5  78.0 - 100.0 fL   MCH 28.5  26.0 - 34.0 pg   MCHC 32.9  30.0 - 36.0 g/dL   RDW 14.2  11.5 - 15.5 %   Platelets 254  150 - 400 K/uL  GLUCOSE, CAPILLARY     Status: Abnormal   Collection Time    03/16/14  7:38 AM      Result Value Ref Range   Glucose-Capillary 217 (*) 70 - 99 mg/dL   Comment 1 Notify RN    GLUCOSE, CAPILLARY     Status: Abnormal    Collection Time    03/16/14 11:27 AM      Result Value Ref Range   Glucose-Capillary 190 (*) 70 - 99 mg/dL   Comment 1 Notify RN    GLUCOSE, CAPILLARY     Status: Abnormal   Collection Time    03/16/14  4:17 PM      Result Value Ref Range   Glucose-Capillary 214 (*) 70 - 99 mg/dL   Comment 1 Notify RN    GLUCOSE, CAPILLARY     Status: None   Collection Time    03/16/14  8:43 PM      Result Value Ref Range   Glucose-Capillary 99  70 - 99 mg/dL   Comment 1 Notify RN    PROTIME-INR     Status: Abnormal   Collection Time    03/17/14  4:55 AM      Result Value Ref Range   Prothrombin Time 22.0 (*) 11.6 - 15.2 seconds   INR 1.99 (*) 0.00 - 1.49  HEPARIN LEVEL (UNFRACTIONATED)     Status: None   Collection Time    03/17/14  4:55 AM      Result Value Ref Range   Heparin Unfractionated 0.58  0.30 - 0.70 IU/mL   Comment:            IF HEPARIN RESULTS ARE BELOW     EXPECTED VALUES, AND PATIENT     DOSAGE HAS BEEN CONFIRMED,     SUGGEST FOLLOW UP TESTING     OF ANTITHROMBIN III LEVELS.  CBC     Status: Abnormal   Collection Time    03/17/14  4:55 AM      Result Value Ref Range   WBC 5.6  4.0 - 10.5 K/uL   RBC 3.54 (*) 3.87 - 5.11 MIL/uL   Hemoglobin 10.0 (*) 12.0 - 15.0 g/dL   HCT 30.9 (*) 36.0 - 46.0 %   MCV 87.3  78.0 - 100.0 fL   MCH 28.2  26.0 - 34.0 pg   MCHC 32.4  30.0 - 36.0 g/dL   RDW 14.4  11.5 - 15.5 %   Platelets 288  150 - 400 K/uL  GLUCOSE, CAPILLARY     Status: Abnormal   Collection Time    03/17/14  7:27 AM      Result Value Ref Range   Glucose-Capillary 159 (*)  70 - 99 mg/dL   Comment 1 Notify RN       HEENT: normal Cardio: RRR and no murmur Resp: CTA B/L and unlabored GI: BS positive and ND Extremity:  Pulses positive and No Edema Skin:   Intact Neuro: Alert/Oriented, Cranial Nerve Abnormalities Left central 7, Abnormal Motor 3-/5 Left delt bi, tri, grip, HF, KE ADF, Abnormal FMC Ataxic/ dec FMC and Tone  Hypertonia, Tone:  left side increased flexor  tone in UE and Dysarthric Musc/Skel:  Normal Gen NAD   Assessment/Plan: 1. Functional deficits secondary to acute /subacute anterior Right  frontal ischemic stroke which require 3+ hours per day of interdisciplinary therapy in a comprehensive inpatient rehab setting. Physiatrist is providing close team supervision and 24 hour management of active medical problems listed below. Physiatrist and rehab team continue to assess barriers to discharge/monitor patient progress toward functional and medical goals. Team conference today please see physician documentation under team conference tab, met with team face-to-face to discuss problems,progress, and goals. Formulized individual treatment plan based on medical history, underlying problem and comorbidities. FIM: FIM - Bathing Bathing Steps Patient Completed: Chest;Left upper leg;Right Arm;Right lower leg (including foot);Left lower leg (including foot);Left Arm;Abdomen;Front perineal area;Buttocks;Right upper leg Bathing: 5: Supervision: Safety issues/verbal cues  FIM - Upper Body Dressing/Undressing Upper body dressing/undressing steps patient completed: Thread/unthread right sleeve of pullover shirt/dresss;Thread/unthread left sleeve of pullover shirt/dress;Put head through opening of pull over shirt/dress;Pull shirt over trunk Upper body dressing/undressing: 0: Activity did not occur FIM - Lower Body Dressing/Undressing Lower body dressing/undressing steps patient completed: Thread/unthread right underwear leg;Thread/unthread left underwear leg;Pull underwear up/down;Don/Doff right sock;Don/Doff left sock Lower body dressing/undressing: 5: Supervision: Safety issues/verbal cues  FIM - Toileting Toileting steps completed by patient: Adjust clothing prior to toileting;Performs perineal hygiene;Adjust clothing after toileting Toileting Assistive Devices: Grab bar or rail for support Toileting: 4: Steadying assist  FIM - Sport and exercise psychologist Devices: Elevated toilet seat;Grab bars Toilet Transfers: 5-To toilet/BSC: Supervision (verbal cues/safety issues);5-From toilet/BSC: Supervision (verbal cues/safety issues)  FIM - Engineer, site Assistive Devices: Bed rails Bed/Chair Transfer: 0: Activity did not occur  FIM - Locomotion: Wheelchair Locomotion: Wheelchair: 0: Activity did not occur FIM - Locomotion: Ambulation Locomotion: Ambulation Assistive Devices: Administrator Ambulation/Gait Assistance: 5: Supervision Locomotion: Ambulation: 5: Travels 150 ft or more with supervision/safety issues  Comprehension Comprehension Mode: Auditory Comprehension: 6-Follows complex conversation/direction: With extra time/assistive device  Expression Expression Mode: Verbal Expression: 5-Expresses complex 90% of the time/cues < 10% of the time  Social Interaction Social Interaction: 6-Interacts appropriately with others with medication or extra time (anti-anxiety, antidepressant).  Problem Solving Problem Solving: 5-Solves basic problems: With no assist  Memory Memory: 5-Recognizes or recalls 90% of the time/requires cueing < 10% of the time  Medical Problem List and Plan:  1. 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 DVT Prophylaxis/Anticoagulation: Heparin Coumadin therapy with history of aortic valve replacement. INR goal 2.5-3.5. Once goal is reached and maintained will d/c central line 3. Pain Management: Tylenol as needed.  4. Neuropsych: This patient is capable of making decisions on her own behalf.  5. Diabetes mellitus with peripheral neuropathy. Hemoglobin A1c pending. Levemir 10 units daily. Check blood sugars a.c. and at bedtime  6. CAD with CABG. Aspirin 81 mg daily. No chest pain or shortness of breath  7. Hyperlipidemia. Zocor  8. Insomnia.Rozerem    LOS (Days) 5 A FACE  TO FACE EVALUATION WAS  PERFORMED  Charlett Blake 03/17/2014, 8:26 AM

## 2014-03-17 NOTE — Progress Notes (Signed)
Occupational Therapy Session Note  Patient Details  Name: Sinai Illingworth MRN: [id number] Date of Birth: [birth year]  Today's Date: 03/17/2014 Time: 6294-7654 Time Calculation (min): 31 min  Short Term Goals: Week 1:  OT Short Term Goal 1 (Week 1): STGs equal to LTGs set at overall modified independent level.  Skilled Therapeutic Interventions/Progress Updates:   Therapist rolled pt down to the ADL kitchen in the wheelchair to begin session.  Educated pt on use of the RW for simple meal prep including gathering items in an organized fashion, using countertops for transferring items around, correct placement of the walker when reaching into cabinets, and use of a walker bag.  Pt was able to ambulate with the walker to place items back in the appropriate spots that therapist had removed them from.  She needed min instructional cueing to not try and push the walker and attempt to hold items in her hand at the same time.  She reports having light cups and using paper products a lot at home.  Also discussed moving items from the kitchen to the table which is just inside another room such as using items to make a bridge, using the walker bag, or even a walker tray.  Pt voices understanding of these.  Overall supervision for using the walker in the kitchen secondary to IV and need for min instructional cueing.   Therapy Documentation Precautions:  Precautions Precautions: Fall Restrictions Weight Bearing Restrictions: No  Pain: Pain Assessment Pain Assessment: No/denies pain ADL: See FIM for current functional status  Therapy/Group: Individual Therapy  Delon Sacramento OTR/L 03/17/2014, 3:49 PM

## 2014-03-17 NOTE — Progress Notes (Signed)
Social Work Patient ID: Madison [name], female   DOB: 1961/04/03, 53 y.o.   MRN: [id number] Met with pt to discuss team conference goals-supervision/mod/i level and discharge 5/4.  Hoping INR levels will be able to discharge central line then. Pt concerned about missing clothes and glasses.  She has informed nursing of this and looking for here and at home.  She is concerned today due to sister can not watch son Friday and Sat will need to find childcare for these two days.  Will assist with trying to locate her things here.

## 2014-03-17 NOTE — Progress Notes (Signed)
ANTICOAGULATION CONSULT NOTE - Follow up  Pharmacy Consult for Heparin and Coumadin Indication: h/o AVR  Allergies  Allergen Reactions  . Celebrex [Celecoxib] Rash  . Detrol [Tolterodine]     Patient Measurements: Height: 5\' 3"  (160 cm) Weight: 153 lb 7 oz (69.6 kg) IBW/kg (Calculated) : 52.4 Heparin Dosing Weight: 66.8 kg  Vital Signs: Temp: 98.4 F (36.9 C) (04/29 0455) Temp src: Oral (04/29 0455) BP: 100/61 mmHg (04/29 0455) Pulse Rate: 63 (04/29 0455)  Labs:  Recent Labs  03/15/14 0602 03/16/14 0602 03/17/14 0455  HGB 10.1* 10.3* 10.0*  HCT 30.5* 31.3* 30.9*  PLT 222 254 288  LABPROT 19.8* 21.2* 22.0*  INR 1.74* 1.90* 1.99*  HEPARINUNFRC 0.38 0.55 0.58  CREATININE 0.72  --   --     Estimated Creatinine Clearance: 76.1 ml/min (by C-G formula based on Cr of 0.72).   Medical History: Past Medical History  Diagnosis Date  . CAD (coronary artery disease)   . Obesity   . Hypercholesteremia   . HTN (hypertension)   . Dyslipidemia   . Stroke syndrome   . Aortic stenosis   . Thrombophlebitis   . Diabetes   . Hyperlipidemia   . Rheumatoid arthritis(714.0)     Medications:  Prescriptions prior to admission  Medication Sig Dispense Refill  . aspirin 81 MG tablet Take 81 mg by mouth daily.      . baclofen (LIORESAL) 10 MG tablet Take 1 tablet (10 mg total) by mouth every morning.  90 each  0  . Doxepin HCl (SILENOR) 6 MG TABS Take 6 mg by mouth at bedtime.      03/19/14 escitalopram (LEXAPRO) 5 MG tablet Take 5 mg by mouth daily.      . heparin 100-0.45 UNIT/ML-% infusion Inject 1,050 Units/hr into the vein continuous.  250 mL  1  . insulin aspart (NOVOLOG) 100 UNIT/ML injection Inject 0-15 Units into the skin 3 (three) times daily with meals.  10 mL  11  . insulin aspart protamine- aspart (NOVOLOG MIX 70/30) (70-30) 100 UNIT/ML injection Inject 0.2 mLs (20 Units total) into the skin 2 (two) times daily with a meal.  10 mL  11  . omeprazole (PRILOSEC) 40 MG  capsule Take 1 capsule (40 mg total) by mouth daily.  90 capsule  3  . pantoprazole (PROTONIX) 40 MG tablet Take 1 tablet (40 mg total) by mouth daily at 12 noon.  30 tablet  0  . pravastatin (PRAVACHOL) 20 MG tablet Take 1 tablet (20 mg total) by mouth daily.  90 tablet  0  . ramelteon (ROZEREM) 8 MG tablet Take 1 tablet (8 mg total) by mouth at bedtime.  30 tablet  0  . solifenacin (VESICARE) 5 MG tablet Take 5 mg by mouth daily.      01-18-1982 warfarin (COUMADIN) 5 MG tablet Take 5 mg by mouth daily.      Marland Kitchen warfarin (COUMADIN) 7.5 MG tablet Take 1 tablet (7.5 mg total) by mouth one time only at 6 PM.  30 tablet  0   Scheduled:  . aspirin  81 mg Oral Daily  . baclofen  10 mg Oral BID  . darifenacin  7.5 mg Oral Daily  . escitalopram  5 mg Oral Daily  . insulin aspart  0-15 Units Subcutaneous TID WC  . insulin aspart protamine- aspart  20 Units Subcutaneous BID WC  . pantoprazole  40 mg Oral Q1200  . ramelteon  8 mg Oral QHS  . simvastatin  10 mg Oral q1800  . Warfarin - Pharmacist Dosing Inpatient   Does not apply q1800   Infusions:  . heparin 1,050 Units/hr (03/17/14 0324)    Assessment: 21 YOF s/p acute CVA and NSTEMI continuing on Coumadin and IV heparin bridge for history of AVR.  Today the heparin level = 0.58 on 1050 units/hr IV heparin. INR = 1.99, gradually increasing toward goal INR 2.5-3.5.  Heparin level goal set at lower end of therapeutic range 0.3-0.5 per MD's order in patient with new right frontal infarction felt to be embolic superimposed on previous right MCA infarct identified on CT. Heme: Hgb 10.0<<10.3 and Plt 288<<254  stable. No bleeding noted.   PTA coumadin dose (per anticoag office visit on 03/01/14) was 5mg  qMWF and 2.5mg  qTTSS. Patient reported PTA dose was 5 mg daily.  Anticoag office visit notes on 03/01/14 indicates possibility of missed doses. Admit INR on 03/08/14 was 1.37, SUBtherapeutic on admit.  Since 4/24 she has received higher doses of 7.5mg , 7.5 mg, 7.5 mg ,  10mg  and 7.5mg  (4/24- 4/28).  INR is gradually increasing toward goal 2.5-3.5.  No drug interactions noted except Aspirin 81 mg daily (increased bleeding risk).     Goal of Therapy:  Heparin level: 0.3-0.5 units/mL INR 2.5-3.5 per Neuro Monitor platelets by anticoagulation protocol: Yes   Plan:  Decrease IV heparin rate slightly to 950 units/hr Check 6 hr heparin level. Give Coumadin 10 mg today.  Daily heparin level, CBC and INR.    08-28-1974, RPh Clinical Pharmacist Pager: [phone] 03/17/2014,9:17 AM

## 2014-03-17 NOTE — Progress Notes (Signed)
ANTICOAGULATION CONSULT NOTE - Follow up  Pharmacy Consult for Heparin and Coumadin Indication: h/o AVR  Allergies  Allergen Reactions  . Celebrex [Celecoxib] Rash  . Detrol [Tolterodine]     Patient Measurements: Height: 5\' 3"  (160 cm) Weight: 149 lb 14.6 oz (68 kg) IBW/kg (Calculated) : 52.4 Heparin Dosing Weight: 66.8 kg  Vital Signs: Temp: 98.2 F (36.8 C) (04/29 1441) Temp src: Oral (04/29 1441) BP: 101/64 mmHg (04/29 1441) Pulse Rate: 64 (04/29 1441)  Labs:  Recent Labs  03/15/14 0602 03/16/14 0602 03/17/14 0455 03/17/14 1620  HGB 10.1* 10.3* 10.0*  --   HCT 30.5* 31.3* 30.9*  --   PLT 222 254 288  --   LABPROT 19.8* 21.2* 22.0*  --   INR 1.74* 1.90* 1.99*  --   HEPARINUNFRC 0.38 0.55 0.58 0.41  CREATININE 0.72  --   --   --     Estimated Creatinine Clearance: 75.2 ml/min (by C-G formula based on Cr of 0.72).   Medical History: Past Medical History  Diagnosis Date  . CAD (coronary artery disease)   . Obesity   . Hypercholesteremia   . HTN (hypertension)   . Dyslipidemia   . Stroke syndrome   . Aortic stenosis   . Thrombophlebitis   . Diabetes   . Hyperlipidemia   . Rheumatoid arthritis(714.0)     Medications:  Prescriptions prior to admission  Medication Sig Dispense Refill  . aspirin 81 MG tablet Take 81 mg by mouth daily.      . baclofen (LIORESAL) 10 MG tablet Take 1 tablet (10 mg total) by mouth every morning.  90 each  0  . Doxepin HCl (SILENOR) 6 MG TABS Take 6 mg by mouth at bedtime.      03/19/14 escitalopram (LEXAPRO) 5 MG tablet Take 5 mg by mouth daily.      . heparin 100-0.45 UNIT/ML-% infusion Inject 1,050 Units/hr into the vein continuous.  250 mL  1  . insulin aspart (NOVOLOG) 100 UNIT/ML injection Inject 0-15 Units into the skin 3 (three) times daily with meals.  10 mL  11  . insulin aspart protamine- aspart (NOVOLOG MIX 70/30) (70-30) 100 UNIT/ML injection Inject 0.2 mLs (20 Units total) into the skin 2 (two) times daily with a  meal.  10 mL  11  . omeprazole (PRILOSEC) 40 MG capsule Take 1 capsule (40 mg total) by mouth daily.  90 capsule  3  . pantoprazole (PROTONIX) 40 MG tablet Take 1 tablet (40 mg total) by mouth daily at 12 noon.  30 tablet  0  . pravastatin (PRAVACHOL) 20 MG tablet Take 1 tablet (20 mg total) by mouth daily.  90 tablet  0  . ramelteon (ROZEREM) 8 MG tablet Take 1 tablet (8 mg total) by mouth at bedtime.  30 tablet  0  . solifenacin (VESICARE) 5 MG tablet Take 5 mg by mouth daily.      01-18-1982 warfarin (COUMADIN) 5 MG tablet Take 5 mg by mouth daily.      Marland Kitchen warfarin (COUMADIN) 7.5 MG tablet Take 1 tablet (7.5 mg total) by mouth one time only at 6 PM.  30 tablet  0   Scheduled:  . aspirin  81 mg Oral Daily  . baclofen  10 mg Oral BID  . darifenacin  7.5 mg Oral Daily  . escitalopram  5 mg Oral Daily  . insulin aspart  0-15 Units Subcutaneous TID WC  . insulin aspart protamine- aspart  20 Units Subcutaneous  BID WC  . pantoprazole  40 mg Oral Q1200  . ramelteon  8 mg Oral QHS  . simvastatin  10 mg Oral q1800  . warfarin  10 mg Oral ONCE-1800  . Warfarin - Pharmacist Dosing Inpatient   Does not apply q1800   Infusions:  . heparin 950 Units/hr (03/17/14 1016)    Assessment: 53 YOF s/p acute CVA and NSTEMI continuing on Coumadin and IV heparin bridge for history of AVR.  Today the heparin level = 0.58 on 1050 units/hr IV heparin. INR = 1.99, gradually increasing toward goal INR 2.5-3.5.  Heparin level goal set at lower end of therapeutic range 0.3-0.5 per MD's order in patient with new right frontal infarction felt to be embolic superimposed on previous right MCA infarct identified on CT. Heme: Hgb 10.0<<10.3 and Plt 288<<254  stable. No bleeding noted.   Heparin drip decreased this am 950 uts/hr HL 0.41 - at goal.  No bleeding noted.     Goal of Therapy:  Heparin level: 0.3-0.5 units/mL INR 2.5-3.5 per Neuro Monitor platelets by anticoagulation protocol: Yes   Plan:  Continue  IV heparin  rate 950 units/hr Daily heparin level, CBC and INR.    Leota Sauers Pharm.D. CPP, BCPS Clinical Pharmacist [phone] 03/17/2014 5:42 PM

## 2014-03-17 NOTE — Progress Notes (Signed)
Occupational Therapy Session Note  Patient Details  Name: Madison [name] MRN: [id number] Date of Birth: Dec 06, 1960  Today's Date: 03/17/2014 Time: 0900-0958 Time Calculation (min): 58 min  Short Term Goals: Week 1:  OT Short Term Goal 1 (Week 1): STGs equal to LTGs set at overall modified independent level.  Skilled Therapeutic Interventions/Progress Updates:    Pt performed bathing and dressing during OT session.  Had her stand and use the RW to gather all of her clothing and towels prior to starting bath at the sink.  She was able to use the walker with supervision and min instructional cueing to stay inside of the walker at all times.  Performed toilet transfer and toileting as well with supervision.  Overall pt performed all bathing and dressing at a supervision level.  She uses the LUE as an active assist to perform washing, dressing, and grooming tasks.  She still demonstrates decreased gross and FM coordination in the right hand and needed multiple attempts and max instructional cueing to work on tying her sweatpants.   Therapy Documentation Precautions:  Precautions Precautions: Fall Restrictions Weight Bearing Restrictions: No  Pain: Pain Assessment Pain Assessment: No/denies pain ADL: See FIM for current functional status  Therapy/Group: Individual Therapy  Delon Sacramento OTR/L 03/17/2014, 9:59 AM

## 2014-03-18 ENCOUNTER — Inpatient Hospital Stay (HOSPITAL_COMMUNITY): Payer: Commercial Managed Care - HMO | Admitting: Rehabilitation

## 2014-03-18 ENCOUNTER — Inpatient Hospital Stay (HOSPITAL_COMMUNITY): Payer: Commercial Managed Care - HMO | Admitting: Occupational Therapy

## 2014-03-18 ENCOUNTER — Encounter (HOSPITAL_COMMUNITY): Payer: Commercial Managed Care - HMO | Admitting: Occupational Therapy

## 2014-03-18 ENCOUNTER — Inpatient Hospital Stay (HOSPITAL_COMMUNITY): Payer: Medicare HMO | Admitting: Speech Pathology

## 2014-03-18 DIAGNOSIS — G8110 Spastic hemiplegia affecting unspecified side: Secondary | ICD-10-CM

## 2014-03-18 DIAGNOSIS — I6350 Cerebral infarction due to unspecified occlusion or stenosis of unspecified cerebral artery: Secondary | ICD-10-CM

## 2014-03-18 LAB — CBC
HCT: 31 % — ABNORMAL LOW (ref 36.0–46.0)
HEMOGLOBIN: 10.1 g/dL — AB (ref 12.0–15.0)
MCH: 28.2 pg (ref 26.0–34.0)
MCHC: 32.6 g/dL (ref 30.0–36.0)
MCV: 86.6 fL (ref 78.0–100.0)
Platelets: 283 10*3/uL (ref 150–400)
RBC: 3.58 MIL/uL — AB (ref 3.87–5.11)
RDW: 14.2 % (ref 11.5–15.5)
WBC: 5.5 10*3/uL (ref 4.0–10.5)

## 2014-03-18 LAB — PROTIME-INR
INR: 2.17 — ABNORMAL HIGH (ref 0.00–1.49)
PROTHROMBIN TIME: 23.5 s — AB (ref 11.6–15.2)

## 2014-03-18 LAB — GLUCOSE, CAPILLARY
GLUCOSE-CAPILLARY: 100 mg/dL — AB (ref 70–99)
Glucose-Capillary: 340 mg/dL — ABNORMAL HIGH (ref 70–99)
Glucose-Capillary: 193 mg/dL — ABNORMAL HIGH (ref 70–99)
Glucose-Capillary: 138 mg/dL — ABNORMAL HIGH (ref 70–99)

## 2014-03-18 LAB — HEPARIN LEVEL (UNFRACTIONATED): Heparin Unfractionated: 0.33 IU/mL (ref 0.30–0.70)

## 2014-03-18 MED ORDER — WARFARIN SODIUM 10 MG PO TABS
10.0000 mg | ORAL_TABLET | Freq: Once | ORAL | Status: AC
  Administered 2014-03-18: 10 mg via ORAL
  Filled 2014-03-18: qty 1

## 2014-03-18 NOTE — Progress Notes (Signed)
Occupational Therapy Session Note  Patient Details  Name: Madison [name] MRN: [id number] Date of Birth: 1961/04/30  Today's Date: 03/18/2014 Time: 2297-9892 Time Calculation (min): 30 min  Skilled Therapeutic Interventions/Progress Updates:    Pt worked on standing and folding her clothing using the bedside table.  Encouraged functional use of the LUE for turning clothing inside out as well as folding it and placing it in her drawer.  Discussed use of a rolling cart or stroller to provide some balance support but allow her to take her laundry to and from the laundry mat in her apartment complex.  Pt supervision for all aspects of activity.  Finished session by having her perform functional reaching task with the LUE while picking up small pieces of foam and placing them in a container.  Pt still tends to use a synergy pattern for reach with decreased isolate elbow extension, pure shoulder flexion, and index finger extension.  Therapy Documentation Precautions:  Precautions Precautions: Fall Restrictions Weight Bearing Restrictions: No  Pain: Pain Assessment Pain Assessment: No/denies pain ADL: See FIM for current functional status  Therapy/Group: Individual Therapy  Delon Sacramento OTR/L 03/18/2014, 3:42 PM

## 2014-03-18 NOTE — Progress Notes (Addendum)
Subjective/Complaints: 53 y.o. right-handed female with history of diabetes mellitus, hypertension, CAD with CABG as well as aortic valve replacement on chronic Coumadin. Patient was independent prior to admission with ADLs and mobility. Admitted 03/08/2014 with altered mental status, hypoglycemia and acute respiratory failure. Patient was intubated in the emergency room. Noted blood sugar of 1124 as well as potassium 7.2. 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 INR on admission of 1.37. Echocardiogram and carotid Dopplers are pending. Critical medicine consulted for severe DKA metabolic acidosis hyperkalemia. Placed on intravenous fluids. Patient was extubated 03/09/2014  Slept ok  Review of Systems - Negative except left side weak, prior stroke  Objective: Vital Signs: Blood pressure 115/70, pulse 59, temperature 97.6 F (36.4 C), temperature source Oral, resp. rate 19, height '5\' 3"'  (1.6 m), weight 68.2 kg (150 lb 5.7 oz), SpO2 99.00%. No results found. Results for orders placed during the hospital encounter of 03/12/14 (from the past 72 hour(s))  GLUCOSE, CAPILLARY     Status: None   Collection Time    03/15/14 11:19 AM      Result Value Ref Range   Glucose-Capillary 85  70 - 99 mg/dL  GLUCOSE, CAPILLARY     Status: Abnormal   Collection Time    03/15/14  4:32 PM      Result Value Ref Range   Glucose-Capillary 193 (*) 70 - 99 mg/dL   Comment 1 Notify RN    GLUCOSE, CAPILLARY     Status: Abnormal   Collection Time    03/15/14  9:17 PM      Result Value Ref Range   Glucose-Capillary 67 (*) 70 - 99 mg/dL  GLUCOSE, CAPILLARY     Status: None   Collection Time    03/15/14  9:55 PM      Result Value Ref Range   Glucose-Capillary 94  70 - 99 mg/dL  PROTIME-INR     Status: Abnormal   Collection Time    03/16/14  6:02 AM      Result Value Ref Range   Prothrombin Time 21.2 (*) 11.6 - 15.2 seconds   INR 1.90 (*) 0.00 - 1.49  HEPARIN LEVEL (UNFRACTIONATED)     Status: None   Collection Time    03/16/14  6:02 AM      Result Value Ref Range   Heparin Unfractionated 0.55  0.30 - 0.70 IU/mL   Comment:            IF HEPARIN RESULTS ARE BELOW     EXPECTED VALUES, AND PATIENT     DOSAGE HAS BEEN CONFIRMED,     SUGGEST FOLLOW UP TESTING     OF ANTITHROMBIN III LEVELS.  CBC     Status: Abnormal   Collection Time    03/16/14  6:02 AM      Result Value Ref Range   WBC 5.8  4.0 - 10.5 K/uL   RBC 3.62 (*) 3.87 - 5.11 MIL/uL   Hemoglobin 10.3 (*) 12.0 - 15.0 g/dL   HCT 31.3 (*) 36.0 - 46.0 %   MCV 86.5  78.0 - 100.0 fL   MCH 28.5  26.0 - 34.0 pg   MCHC 32.9  30.0 - 36.0 g/dL   RDW 14.2  11.5 - 15.5 %   Platelets 254  150 - 400 K/uL  GLUCOSE, CAPILLARY     Status: Abnormal   Collection Time    03/16/14  7:38 AM      Result Value Ref Range   Glucose-Capillary 217 (*) 70 - 99 mg/dL   Comment 1 Notify RN    GLUCOSE, CAPILLARY     Status: Abnormal   Collection Time    03/16/14 11:27 AM      Result Value Ref Range   Glucose-Capillary 190 (*) 70 - 99 mg/dL   Comment 1 Notify RN    GLUCOSE, CAPILLARY     Status: Abnormal   Collection Time    03/16/14  4:17 PM      Result Value Ref Range   Glucose-Capillary 214 (*) 70 - 99 mg/dL   Comment 1 Notify RN    GLUCOSE, CAPILLARY     Status: None   Collection Time    03/16/14  8:43 PM      Result Value Ref Range   Glucose-Capillary 99  70 - 99 mg/dL   Comment 1 Notify RN    PROTIME-INR     Status: Abnormal   Collection Time    03/17/14  4:55 AM      Result Value Ref Range   Prothrombin Time 22.0 (*) 11.6 - 15.2 seconds   INR 1.99 (*) 0.00 - 1.49  HEPARIN LEVEL (UNFRACTIONATED)     Status: None   Collection Time    03/17/14  4:55 AM      Result Value Ref Range   Heparin Unfractionated 0.58  0.30 - 0.70 IU/mL   Comment:            IF HEPARIN RESULTS ARE BELOW     EXPECTED VALUES, AND PATIENT     DOSAGE HAS BEEN CONFIRMED,      SUGGEST FOLLOW UP TESTING     OF ANTITHROMBIN III LEVELS.  CBC     Status: Abnormal   Collection Time    03/17/14  4:55 AM      Result Value Ref Range   WBC 5.6  4.0 - 10.5 K/uL   RBC 3.54 (*) 3.87 - 5.11 MIL/uL   Hemoglobin 10.0 (*) 12.0 - 15.0 g/dL   HCT 30.9 (*) 36.0 - 46.0 %   MCV 87.3  78.0 - 100.0 fL   MCH 28.2  26.0 - 34.0 pg   MCHC 32.4  30.0 - 36.0 g/dL   RDW 14.4  11.5 - 15.5 %   Platelets 288  150 - 400 K/uL  GLUCOSE, CAPILLARY     Status: Abnormal   Collection Time    03/17/14  7:27 AM      Result Value Ref Range   Glucose-Capillary 159 (*) 70 - 99 mg/dL   Comment 1 Notify RN    GLUCOSE, CAPILLARY     Status: Abnormal   Collection Time    03/17/14 11:22 AM      Result Value Ref Range   Glucose-Capillary 207 (*) 70 - 99 mg/dL   Comment 1 Notify RN    HEPARIN LEVEL (UNFRACTIONATED)     Status: None   Collection Time    03/17/14  4:20 PM      Result Value Ref Range   Heparin Unfractionated 0.41  0.30 - 0.70 IU/mL   Comment:            IF HEPARIN RESULTS ARE BELOW     EXPECTED VALUES, AND PATIENT     DOSAGE HAS BEEN CONFIRMED,     SUGGEST FOLLOW UP TESTING     OF ANTITHROMBIN III LEVELS.  GLUCOSE, CAPILLARY  Status: Abnormal   Collection Time    03/17/14  4:20 PM      Result Value Ref Range   Glucose-Capillary 145 (*) 70 - 99 mg/dL   Comment 1 Notify RN    GLUCOSE, CAPILLARY     Status: Abnormal   Collection Time    03/17/14  9:11 PM      Result Value Ref Range   Glucose-Capillary 173 (*) 70 - 99 mg/dL  PROTIME-INR     Status: Abnormal   Collection Time    03/18/14  6:13 AM      Result Value Ref Range   Prothrombin Time 23.5 (*) 11.6 - 15.2 seconds   INR 2.17 (*) 0.00 - 1.49  HEPARIN LEVEL (UNFRACTIONATED)     Status: None   Collection Time    03/18/14  6:13 AM      Result Value Ref Range   Heparin Unfractionated 0.33  0.30 - 0.70 IU/mL   Comment:            IF HEPARIN RESULTS ARE BELOW     EXPECTED VALUES, AND PATIENT     DOSAGE HAS BEEN  CONFIRMED,     SUGGEST FOLLOW UP TESTING     OF ANTITHROMBIN III LEVELS.  CBC     Status: Abnormal   Collection Time    03/18/14  6:13 AM      Result Value Ref Range   WBC 5.5  4.0 - 10.5 K/uL   RBC 3.58 (*) 3.87 - 5.11 MIL/uL   Hemoglobin 10.1 (*) 12.0 - 15.0 g/dL   HCT 31.0 (*) 36.0 - 46.0 %   MCV 86.6  78.0 - 100.0 fL   MCH 28.2  26.0 - 34.0 pg   MCHC 32.6  30.0 - 36.0 g/dL   RDW 14.2  11.5 - 15.5 %   Platelets 283  150 - 400 K/uL  GLUCOSE, CAPILLARY     Status: Abnormal   Collection Time    03/18/14  7:12 AM      Result Value Ref Range   Glucose-Capillary 340 (*) 70 - 99 mg/dL   Comment 1 Notify RN       HEENT: normal Cardio: RRR and no murmur Resp: CTA B/L and unlabored GI: BS positive and ND Extremity:  Pulses positive and No Edema Skin:   Intact Neuro: Alert/Oriented, Cranial Nerve Abnormalities Left central 7, Abnormal Motor 3-/5 Left delt bi, tri, grip, HF, KE ADF, Abnormal FMC Ataxic/ dec FMC and Tone  Hypertonia, Tone:  left side increased flexor tone in UE and Dysarthric Musc/Skel:  Normal Gen NAD   Assessment/Plan: 1. Functional deficits secondary to acute /subacute anterior Right  frontal ischemic stroke which require 3+ hours per day of interdisciplinary therapy in a comprehensive inpatient rehab setting. Physiatrist is providing close team supervision and 24 hour management of active medical problems listed below. Physiatrist and rehab team continue to assess barriers to discharge/monitor patient progress toward functional and medical goals. Team conference today please see physician documentation under team conference tab, met with team face-to-face to discuss problems,progress, and goals. Formulized individual treatment plan based on medical history, underlying problem and comorbidities. FIM: FIM - Bathing Bathing Steps Patient Completed: Chest;Left upper leg;Right Arm;Right lower leg (including foot);Left lower leg (including foot);Left Arm;Abdomen;Front  perineal area;Buttocks;Right upper leg Bathing: 5: Supervision: Safety issues/verbal cues  FIM - Upper Body Dressing/Undressing Upper body dressing/undressing steps patient completed: Thread/unthread right sleeve of pullover shirt/dresss;Thread/unthread left sleeve of pullover shirt/dress;Put head through opening  of pull over shirt/dress;Pull shirt over trunk Upper body dressing/undressing: 5: Supervision: Safety issues/verbal cues FIM - Lower Body Dressing/Undressing Lower body dressing/undressing steps patient completed: Thread/unthread right underwear leg;Thread/unthread left underwear leg;Pull underwear up/down;Don/Doff right sock;Don/Doff left sock Lower body dressing/undressing: 5: Set-up assist to: Don/Doff AFO/prosthesis/orthosis  FIM - Toileting Toileting steps completed by patient: Adjust clothing prior to toileting;Performs perineal hygiene;Adjust clothing after toileting Toileting Assistive Devices: Grab bar or rail for support Toileting: 4: Steadying assist  FIM - Radio producer Devices: Elevated toilet seat;Grab bars Toilet Transfers: 5-From toilet/BSC: Supervision (verbal cues/safety issues)  FIM - Control and instrumentation engineer Devices: Bed rails Bed/Chair Transfer: 5: Supine > Sit: Supervision (verbal cues/safety issues);5: Bed > Chair or W/C: Supervision (verbal cues/safety issues)  FIM - Locomotion: Wheelchair Locomotion: Wheelchair: 0: Activity did not occur FIM - Locomotion: Ambulation Locomotion: Ambulation Assistive Devices: Administrator Ambulation/Gait Assistance: 5: Supervision Locomotion: Ambulation: 5: Travels 150 ft or more with supervision/safety issues  Comprehension Comprehension Mode: Auditory Comprehension: 6-Follows complex conversation/direction: With extra time/assistive device  Expression Expression Mode: Verbal Expression: 6-Expresses complex ideas: With extra time/assistive device  Social  Interaction Social Interaction: 6-Interacts appropriately with others with medication or extra time (anti-anxiety, antidepressant).  Problem Solving Problem Solving: 5-Solves complex 90% of the time/cues < 10% of the time  Memory Memory: 6-More than reasonable amt of time  Medical Problem List and Plan:  1. 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 DVT Prophylaxis/Anticoagulation: Heparin Coumadin therapy with history of aortic valve replacement. INR goal 2.5-3.5. Once goal is reached and maintained will d/c central line 3. Pain Management: Tylenol as needed.  4. Neuropsych: This patient is capable of making decisions on her own behalf.  5. Diabetes mellitus with peripheral neuropathy. Hemoglobin A1c pending. 70/30 insulin will titrate  Check blood sugars a.c. and at bedtime  6. CAD with CABG. Aspirin 81 mg daily. No chest pain or shortness of breath  7. Hyperlipidemia. Zocor  8. Insomnia.Rozerem    LOS (Days) 6 A FACE TO FACE EVALUATION WAS PERFORMED  Madison [name] 03/18/2014, 8:00 AM

## 2014-03-18 NOTE — Progress Notes (Signed)
Physical Therapy Session Note  Patient Details  Name: Madison [name] MRN: [id number] Date of Birth: [birth year]  Today's Date: 03/18/2014 Time: 1030-1129 Time Calculation (min): 59 min  Short Term Goals: Week 1:  PT Short Term Goal 1 (Week 1): =LTG secondary to short LOS  Skilled Therapeutic Interventions/Progress Updates:   Pt received sitting in arm chair in room, requesting to make phone call.  Assisted with fixing phone as cord was not plugged into wall all the way.  Pt then ambulated to/from gym without AD in order to further challenge balance.  She was able to ambulate at supervision level.  Continues to demonstrate decreased hip/knee flexion on LLE, however feel that this is premorbid as pt states her gait "is normal now."  Once in gym, performed high level dynamic standing balance on foam pad with feet apart with ball toss>feet together with ball toss.  Requires min/guard assist with min assist for one instance of LOB posteriorly.  Then performed high level gait/balance activity with ball toss then ball bounce with gait.  Pt requires min cues for attending to task at times as she was easily distracted by environment.  Then performed standing on BOSU ball upside down while in // bars in order to work on ankle and hip strategy.  Pt initially with BUE support>single UE support>no UE support with min assist to maintain balance with questioning cues for correcting balance. Allowed seated rest break following activity then ambulated to laundry room and was able to retreive laundry and place in bag at supervision level then back to room as stated above.  Pt left in w/c with all needs in reach.    Therapy Documentation Precautions:  Precautions Precautions: Fall Restrictions Weight Bearing Restrictions: No     Pain: Pt with tenderness over central line site.  Ambulation Ambulation/Gait Assistance: 5: Supervision   See FIM for current functional status  Therapy/Group: Individual  Therapy  Irving Burton A Mako Pelfrey 03/18/2014, 11:29 AM

## 2014-03-18 NOTE — Progress Notes (Signed)
Speech Language Pathology Discharge Summary  Patient Details  Name: Madison [name] MRN: [id number] Date of Birth: [birth year]  Today's Date: 03/18/2014 Time: 5188-4166 Time Calculation (min): 45 min  Skilled Therapeutic Interventions: Skilled treatment session focused on addressing cognition goals.  SLP facilitated session with scavenger hunt around unit with familiar and unfamiliar things to locate.  Patient utilized Psychologist, prison and probation services as a working [employer] banker, operational after SLP verbally provided 5 items/locations to locate.  SLP provided increased time and Supervision assist with mobility while patient completed task Mod I.  Patient also solved problem related to locating her lost belongings with Mod I.  Goals met recommend discharge from SLP services.    Patient has met 8 of 8 long term goals.  Patient to discharge at overall Modified Independent level.  Reasons goals not met: n/a   Clinical Impression/Discharge Summary: Patient met 8 out of 8 long term goals during her rehab stay due to gains in diet advancement and toleration, increased anticipatory awareness, recall of new information and overall problem solving.  Patient is Mod I for complex tasks such as money and medication management and is now able to also anticipate needs with Mod I.  As a result, no further skilled SLP services are warranted at this time.    Care Partner:  Caregiver Able to Provide Assistance: Other (comment) (n/a) patient Mod I.   Recommendation:  None     Equipment: none   Reasons for discharge: Treatment goals met   Patient/Family Agrees with Progress Made and Goals Achieved: Yes   See FIM for current functional status  Carmelia Roller., CCC-SLP [place] 03/18/2014, 4:02 PM

## 2014-03-18 NOTE — Progress Notes (Signed)
Physical Medicine and Rehabilitation Consult  Reason for Consult: CVA  Referring Physician: Critical care  HPI: Madison [name] is a 52 y.o. right-handed female with history of diabetes mellitus, hypertension, CAD with CABG as well as aortic valve replacement on chronic Coumadin. Admitted 03/08/2014 with altered mental status, hypoglycemia and acute respiratory failure. Patient was intubated in the emergency room. Noted blood sugar of 1124 as well as potassium 7.2. 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 INR on admission of 1.37. Echocardiogram and carotid Dopplers are pending. Critical medicine consulted for severe DKA metabolic acidosis hyperkalemia. Placed on intravenous fluids. Patient was extubated 03/09/2014. Neurology services consulted patient presently remains on Coumadin therapy with heparin bridge for secondary stroke prevention. Maintained on a dysphagia 3 thin liquid diet. Physical therapy evaluation completed 03/09/2014 with recommendations for physical medicine rehabilitation consult.  Patient states that prior to admission she is functioning on independent level with dressing and bathing however did have a shorter 20 hours per week for cooking as well as cleaning. Her son is a Consulting civil engineer and is gone all day but his home in the evening  Review of Systems  Gastrointestinal: Positive for constipation.  Musculoskeletal: Positive for joint pain and myalgias.  All other systems reviewed and are negative.   Past Medical History   Diagnosis  Date   .  CAD (coronary artery disease)    .  Obesity    .  Hypercholesteremia    .  HTN (hypertension)    .  Dyslipidemia    .  Stroke syndrome    .  Aortic stenosis    .  Thrombophlebitis    .  Diabetes    .  Hyperlipidemia    .  Rheumatoid arthritis(714.0)     Past Surgical History   Procedure  Laterality  Date   .  Cesarean section     .  Foot fracture surgery     .   Knee surgery     .  Neuroplasty / transposition median nerve at carpal tunnel      Family History   Problem  Relation  Age of Onset   .  Stroke      Social History: reports that she has never smoked. She does not have any smokeless tobacco history on file. She reports that she does not drink alcohol or use illicit drugs.  Allergies:  Allergies   Allergen  Reactions   .  Celebrex [Celecoxib]  Rash   .  Detrol [Tolterodine]     Medications Prior to Admission   Medication  Sig  Dispense  Refill   .  aspirin 81 MG tablet  Take 81 mg by mouth daily.     .  baclofen (LIORESAL) 10 MG tablet  Take 1 tablet (10 mg total) by mouth every morning.  90 each  0   .  citalopram (CELEXA) 40 MG tablet  Take 1 tablet (40 mg total) by mouth daily.  90 tablet  1   .  glucose blood (ONE TOUCH ULTRA TEST) test strip  1 each by Other route 4 (four) times daily. And lancets 4/day 250.01  360 each  12   .  Insulin Aspart Prot & Aspart (NOVOLOG MIX 70/30 FLEXPEN) (70-30) 100 UNIT/ML Pen  Inject 40 Units into the skin daily with breakfast. And pen needles 1/day  15 pen  3   .  omeprazole (PRILOSEC) 40 MG capsule  Take  1 capsule (40 mg total) by mouth daily.  90 capsule  3   .  pravastatin (PRAVACHOL) 20 MG tablet  Take 1 tablet (20 mg total) by mouth daily.  90 tablet  0   .  solifenacin (VESICARE) 5 MG tablet  Take 1/2 tablet once daily  45 tablet  0   .  triamcinolone cream (KENALOG) 0.1 %  Apply topically 2 (two) times daily.  30 g  1   .  warfarin (COUMADIN) 5 MG tablet  Take as directed by anticoagulation clinic  10 tablet  0    Home:  Home Living  Family/patient expects to be discharged to:: Inpatient rehab  Functional History:  Prior Function  Level of Independence: Independent  Functional Status:  Mobility:  Bed Mobility  Overal bed mobility: Needs Assistance  Bed Mobility: Supine to Sit;Sit to Supine  Supine to sit: Min assist;HOB elevated  Sit to supine: Min assist  General bed mobility  comments: pt moves slowly and needs A to bring trunk up to sitting and LEs back to bed.  Transfers  Overall transfer level: Needs assistance  Equipment used: 1 person hand held assist  Transfers: Sit to/from Stand  Sit to Stand: Min assist  General transfer comment: cues for UE use.    ADL:   Cognition:  Cognition  Overall Cognitive Status: Impaired/Different from baseline  Orientation Level: Oriented X4  Cognition  Arousal/Alertness: Awake/alert  Behavior During Therapy: WFL for tasks assessed/performed  Overall Cognitive Status: Impaired/Different from baseline  Area of Impairment: Attention;Memory;Awareness;Problem solving  Current Attention Level: Selective  Memory: Decreased short-term memory  Awareness: Emergent  Problem Solving: Slow processing;Difficulty sequencing;Requires verbal cues;Requires tactile cues  Blood pressure 103/55, pulse 103, temperature 99.6 F (37.6 C), temperature source Core (Comment), resp. rate 23, height 5\' 8"  (1.727 m), weight 173 lb 8 oz (78.7 kg), SpO2 100.00%.  Physical Exam  Vitals reviewed.  HENT:  Head: Normocephalic.  Eyes: EOM are normal.  Neck: Normal range of motion. Neck supple. No thyromegaly present.  Cardiovascular: Normal rate and regular rhythm.  Respiratory: Effort normal and breath sounds normal. No respiratory distress.  GI: Soft. Bowel sounds are normal. She exhibits no distension.  Neurological: She is alert.  Mood is flat but appropriate. She does make good eye contact with examiner. She was able to provide her name age date of birth  Skin: Skin is warm and dry.  motor strength is 5/5 in the right deltoid, bicep, tricep, grip, 4/ hip flexor, knee extensors, ankle dorsiflexor and plantar flexor  3 minus/5 in the left deltoid, bicep, tricep, grip, hip flexor, knee extensors, ankle dorsiflexor is 2 minus ankle plantar flexion 2 minus  Tone hyperactive Babinski increased flexor tone in the left wrist and finger flexors as well  as elbow flexors increased tone in the left plantar flexors  Sensation reduced to light touch in the left hand normal in the right hand normal in both feet  Cerebellar there is past pointing in the left upper extremity  Reduced heel to shin left lower extremity  Right homonymous hemianopsia on confrontational testing  Results for orders placed during the hospital encounter of 03/08/14 (from the past 24 hour(s))   GLUCOSE, CAPILLARY Status: Abnormal    Collection Time    03/08/14 3:26 PM   Result  Value  Ref Range    Glucose-Capillary  366 (*)  70 - 99 mg/dL   BASIC METABOLIC PANEL Status: Abnormal    Collection Time  03/08/14 3:27 PM   Result  Value  Ref Range    Sodium  144  137 - 147 mEq/L    Potassium  3.5 (*)  3.7 - 5.3 mEq/L    Chloride  112  96 - 112 mEq/L    CO2  11 (*)  19 - 32 mEq/L    Glucose, Bld  393 (*)  70 - 99 mg/dL    BUN  31 (*)  6 - 23 mg/dL    Creatinine, Ser  3.84 (*)  0.50 - 1.10 mg/dL    Calcium  8.0 (*)  8.4 - 10.5 mg/dL    GFR calc non Af Amer  41 (*)  >90 mL/min    GFR calc Af Amer  47 (*)  >90 mL/min   CARBOXYHEMOGLOBIN Status: Abnormal    Collection Time    03/08/14 3:30 PM   Result  Value  Ref Range    Total hemoglobin  10.4 (*)  12.0 - 16.0 g/dL    O2 Saturation  53.6     Carboxyhemoglobin  1.6 (*)  0.5 - 1.5 %    Methemoglobin  1.0  0.0 - 1.5 %   GLUCOSE, CAPILLARY Status: Abnormal    Collection Time    03/08/14 4:38 PM   Result  Value  Ref Range    Glucose-Capillary  311 (*)  70 - 99 mg/dL   TROPONIN I Status: None    Collection Time    03/08/14 4:46 PM   Result  Value  Ref Range    Troponin I  <0.30  <0.30 ng/mL   BASIC METABOLIC PANEL Status: Abnormal    Collection Time    03/08/14 4:46 PM   Result  Value  Ref Range    Sodium  146  137 - 147 mEq/L    Potassium  3.4 (*)  3.7 - 5.3 mEq/L    Chloride  113 (*)  96 - 112 mEq/L    CO2  12 (*)  19 - 32 mEq/L    Glucose, Bld  304 (*)  70 - 99 mg/dL    BUN  30 (*)  6 - 23 mg/dL     Creatinine, Ser  4.68 (*)  0.50 - 1.10 mg/dL    Calcium  8.2 (*)  8.4 - 10.5 mg/dL    GFR calc non Af Amer  43 (*)  >90 mL/min    GFR calc Af Amer  50 (*)  >90 mL/min   MAGNESIUM Status: None    Collection Time    03/08/14 4:46 PM   Result  Value  Ref Range    Magnesium  2.0  1.5 - 2.5 mg/dL   PHOSPHORUS Status: Abnormal    Collection Time    03/08/14 4:46 PM   Result  Value  Ref Range    Phosphorus  1.0 (*)  2.3 - 4.6 mg/dL   GLUCOSE, CAPILLARY Status: Abnormal    Collection Time    03/08/14 6:06 PM   Result  Value  Ref Range    Glucose-Capillary  193 (*)  70 - 99 mg/dL   POCT I-STAT 3, ART BLOOD GAS (G3+) Status: Abnormal    Collection Time    03/08/14 6:51 PM   Result  Value  Ref Range    pH, Arterial  7.418  7.350 - 7.450    pCO2 arterial  22.6 (*)  35.0 - 45.0 mmHg    pO2, Arterial  244.0 (*)  80.0 - 100.0 mmHg  Bicarbonate  14.4 (*)  20.0 - 24.0 mEq/L    TCO2  15  0 - 100 mmol/L    O2 Saturation  100.0     Acid-base deficit  8.0 (*)  0.0 - 2.0 mmol/L    Patient temperature  100.2 F     Collection site  RADIAL, ALLEN'S TEST ACCEPTABLE     Drawn by  Operator     Sample type  ARTERIAL    CBC Status: Abnormal    Collection Time    03/08/14 7:00 PM   Result  Value  Ref Range    WBC  20.5 (*)  4.0 - 10.5 K/uL    RBC  3.67 (*)  3.87 - 5.11 MIL/uL    Hemoglobin  10.3 (*)  12.0 - 15.0 g/dL    HCT  78.6 (*)  75.4 - 46.0 %    MCV  82.8  78.0 - 100.0 fL    MCH  28.1  26.0 - 34.0 pg    MCHC  33.9  30.0 - 36.0 g/dL    RDW  49.2  01.0 - 07.1 %    Platelets  253  150 - 400 K/uL   CK Status: None    Collection Time    03/08/14 7:00 PM   Result  Value  Ref Range    Total CK  155  7 - 177 U/L   LACTIC ACID, PLASMA Status: Abnormal    Collection Time    03/08/14 7:00 PM   Result  Value  Ref Range    Lactic Acid, Venous  3.1 (*)  0.5 - 2.2 mmol/L   BASIC METABOLIC PANEL Status: Abnormal    Collection Time    03/08/14 7:00 PM   Result  Value  Ref Range    Sodium  146   137 - 147 mEq/L    Potassium  3.2 (*)  3.7 - 5.3 mEq/L    Chloride  116 (*)  96 - 112 mEq/L    CO2  14 (*)  19 - 32 mEq/L    Glucose, Bld  158 (*)  70 - 99 mg/dL    BUN  27 (*)  6 - 23 mg/dL    Creatinine, Ser  2.19 (*)  0.50 - 1.10 mg/dL    Calcium  8.0 (*)  8.4 - 10.5 mg/dL    GFR calc non Af Amer  52 (*)  >90 mL/min    GFR calc Af Amer  60 (*)  >90 mL/min   GLUCOSE, CAPILLARY Status: Abnormal    Collection Time    03/08/14 7:09 PM   Result  Value  Ref Range    Glucose-Capillary  165 (*)  70 - 99 mg/dL   OCCULT BLOOD GASTRIC / DUODENUM (SPECIMEN CUP) Status: Abnormal    Collection Time    03/08/14 7:10 PM   Result  Value  Ref Range    pH, Gastric  NOT DONE     Occult Blood, Gastric  POSITIVE (*)  NEGATIVE   GLUCOSE, CAPILLARY Status: Abnormal    Collection Time    03/08/14 8:20 PM   Result  Value  Ref Range    Glucose-Capillary  118 (*)  70 - 99 mg/dL   GLUCOSE, CAPILLARY Status: Abnormal    Collection Time    03/08/14 9:27 PM   Result  Value  Ref Range    Glucose-Capillary  118 (*)  70 - 99 mg/dL   TROPONIN I Status: Abnormal  Collection Time    03/08/14 10:10 PM   Result  Value  Ref Range    Troponin I  0.32 (*)  <0.30 ng/mL   BASIC METABOLIC PANEL Status: Abnormal    Collection Time    03/08/14 10:10 PM   Result  Value  Ref Range    Sodium  143  137 - 147 mEq/L    Potassium  3.9  3.7 - 5.3 mEq/L    Chloride  114 (*)  96 - 112 mEq/L    CO2  14 (*)  19 - 32 mEq/L    Glucose, Bld  150 (*)  70 - 99 mg/dL    BUN  25 (*)  6 - 23 mg/dL    Creatinine, Ser  7.41 (*)  0.50 - 1.10 mg/dL    Calcium  8.0 (*)  8.4 - 10.5 mg/dL    GFR calc non Af Amer  56 (*)  >90 mL/min    GFR calc Af Amer  65 (*)  >90 mL/min   GLUCOSE, CAPILLARY Status: Abnormal    Collection Time    03/08/14 10:13 PM   Result  Value  Ref Range    Glucose-Capillary  145 (*)  70 - 99 mg/dL   GLUCOSE, CAPILLARY Status: Abnormal    Collection Time    03/08/14 11:09 PM   Result  Value  Ref Range     Glucose-Capillary  126 (*)  70 - 99 mg/dL   BASIC METABOLIC PANEL Status: Abnormal    Collection Time    03/09/14 12:04 AM   Result  Value  Ref Range    Sodium  141  137 - 147 mEq/L    Potassium  4.4  3.7 - 5.3 mEq/L    Chloride  113 (*)  96 - 112 mEq/L    CO2  14 (*)  19 - 32 mEq/L    Glucose, Bld  179 (*)  70 - 99 mg/dL    BUN  24 (*)  6 - 23 mg/dL    Creatinine, Ser  2.87  0.50 - 1.10 mg/dL    Calcium  7.8 (*)  8.4 - 10.5 mg/dL    GFR calc non Af Amer  58 (*)  >90 mL/min    GFR calc Af Amer  67 (*)  >90 mL/min   CBC Status: Abnormal    Collection Time    03/09/14 12:04 AM   Result  Value  Ref Range    WBC  17.2 (*)  4.0 - 10.5 K/uL    RBC  3.60 (*)  3.87 - 5.11 MIL/uL    Hemoglobin  10.4 (*)  12.0 - 15.0 g/dL    HCT  86.7 (*)  67.2 - 46.0 %    MCV  82.2  78.0 - 100.0 fL    MCH  28.9  26.0 - 34.0 pg    MCHC  35.1  30.0 - 36.0 g/dL    RDW  09.4  70.9 - 62.8 %    Platelets  212  150 - 400 K/uL   GLUCOSE, CAPILLARY Status: Abnormal    Collection Time    03/09/14 12:16 AM   Result  Value  Ref Range    Glucose-Capillary  178 (*)  70 - 99 mg/dL   GLUCOSE, CAPILLARY Status: Abnormal    Collection Time    03/09/14 1:10 AM   Result  Value  Ref Range    Glucose-Capillary  145 (*)  70 - 99 mg/dL  GLUCOSE, CAPILLARY Status: Abnormal    Collection Time    03/09/14 2:04 AM   Result  Value  Ref Range    Glucose-Capillary  170 (*)  70 - 99 mg/dL   GLUCOSE, CAPILLARY Status: Abnormal    Collection Time    03/09/14 3:07 AM   Result  Value  Ref Range    Glucose-Capillary  189 (*)  70 - 99 mg/dL   LACTIC ACID, PLASMA Status: None    Collection Time    03/09/14 4:13 AM   Result  Value  Ref Range    Lactic Acid, Venous  2.0  0.5 - 2.2 mmol/L   PROTIME-INR Status: Abnormal    Collection Time    03/09/14 4:15 AM   Result  Value  Ref Range    Prothrombin Time  21.2 (*)  11.6 - 15.2 seconds    INR  1.90 (*)  0.00 - 1.49   APTT Status: None    Collection Time    03/09/14 4:15 AM    Result  Value  Ref Range    aPTT  37  24 - 37 seconds   CBC Status: Abnormal    Collection Time    03/09/14 4:15 AM   Result  Value  Ref Range    WBC  15.9 (*)  4.0 - 10.5 K/uL    RBC  3.64 (*)  3.87 - 5.11 MIL/uL    Hemoglobin  10.3 (*)  12.0 - 15.0 g/dL    HCT  08.6 (*)  76.1 - 46.0 %    MCV  81.3  78.0 - 100.0 fL    MCH  28.3  26.0 - 34.0 pg    MCHC  34.8  30.0 - 36.0 g/dL    RDW  95.0  93.2 - 67.1 %    Platelets  203  150 - 400 K/uL   PROCALCITONIN Status: None    Collection Time    03/09/14 4:15 AM   Result  Value  Ref Range    Procalcitonin  2.07    BASIC METABOLIC PANEL Status: Abnormal    Collection Time    03/09/14 4:15 AM   Result  Value  Ref Range    Sodium  142  137 - 147 mEq/L    Potassium  3.2 (*)  3.7 - 5.3 mEq/L    Chloride  111  96 - 112 mEq/L    CO2  14 (*)  19 - 32 mEq/L    Glucose, Bld  180 (*)  70 - 99 mg/dL    BUN  21  6 - 23 mg/dL    Creatinine, Ser  2.45  0.50 - 1.10 mg/dL    Calcium  7.8 (*)  8.4 - 10.5 mg/dL    GFR calc non Af Amer  58 (*)  >90 mL/min    GFR calc Af Amer  67 (*)  >90 mL/min   MAGNESIUM Status: None    Collection Time    03/09/14 4:15 AM   Result  Value  Ref Range    Magnesium  1.5  1.5 - 2.5 mg/dL   PHOSPHORUS Status: Abnormal    Collection Time    03/09/14 4:15 AM   Result  Value  Ref Range    Phosphorus  1.5 (*)  2.3 - 4.6 mg/dL   TROPONIN I Status: Abnormal    Collection Time    03/09/14 4:16 AM   Result  Value  Ref Range    Troponin  I  0.43 (*)  <0.30 ng/mL   GLUCOSE, CAPILLARY Status: Abnormal    Collection Time    03/09/14 4:18 AM   Result  Value  Ref Range    Glucose-Capillary  176 (*)  70 - 99 mg/dL   GLUCOSE, CAPILLARY Status: Abnormal    Collection Time    03/09/14 5:19 AM   Result  Value  Ref Range    Glucose-Capillary  134 (*)  70 - 99 mg/dL   GLUCOSE, CAPILLARY Status: Abnormal    Collection Time    03/09/14 6:10 AM   Result  Value  Ref Range    Glucose-Capillary  105 (*)  70 - 99 mg/dL    GLUCOSE, CAPILLARY Status: Abnormal    Collection Time    03/09/14 7:35 AM   Result  Value  Ref Range    Glucose-Capillary  167 (*)  70 - 99 mg/dL   BASIC METABOLIC PANEL Status: Abnormal    Collection Time    03/09/14 8:00 AM   Result  Value  Ref Range    Sodium  139  137 - 147 mEq/L    Potassium  3.7  3.7 - 5.3 mEq/L    Chloride  109  96 - 112 mEq/L    CO2  12 (*)  19 - 32 mEq/L    Glucose, Bld  181 (*)  70 - 99 mg/dL    BUN  18  6 - 23 mg/dL    Creatinine, Ser  2.95  0.50 - 1.10 mg/dL    Calcium  7.9 (*)  8.4 - 10.5 mg/dL    GFR calc non Af Amer  66 (*)  >90 mL/min    GFR calc Af Amer  77 (*)  >90 mL/min   GLUCOSE, CAPILLARY Status: Abnormal    Collection Time    03/09/14 8:35 AM   Result  Value  Ref Range    Glucose-Capillary  199 (*)  70 - 99 mg/dL   GLUCOSE, CAPILLARY Status: Abnormal    Collection Time    03/09/14 9:43 AM   Result  Value  Ref Range    Glucose-Capillary  176 (*)  70 - 99 mg/dL   TROPONIN I Status: Abnormal    Collection Time    03/09/14 9:57 AM   Result  Value  Ref Range    Troponin I  0.52 (*)  <0.30 ng/mL   GLUCOSE, CAPILLARY Status: Abnormal    Collection Time    03/09/14 10:50 AM   Result  Value  Ref Range    Glucose-Capillary  151 (*)  70 - 99 mg/dL   GLUCOSE, CAPILLARY Status: Abnormal    Collection Time    03/09/14 11:54 AM   Result  Value  Ref Range    Glucose-Capillary  144 (*)  70 - 99 mg/dL   BASIC METABOLIC PANEL Status: Abnormal    Collection Time    03/09/14 12:00 PM   Result  Value  Ref Range    Sodium  140  137 - 147 mEq/L    Potassium  4.0  3.7 - 5.3 mEq/L    Chloride  110  96 - 112 mEq/L    CO2  17 (*)  19 - 32 mEq/L    Glucose, Bld  163 (*)  70 - 99 mg/dL    BUN  14  6 - 23 mg/dL    Creatinine, Ser  6.21  0.50 - 1.10 mg/dL    Calcium  7.7 (*)  8.4 - 10.5 mg/dL    GFR calc non Af Amer  67 (*)  >90 mL/min    GFR calc Af Amer  78 (*)  >90 mL/min   HCG, QUANTITATIVE, PREGNANCY Status: None    Collection Time     03/09/14 12:00 PM   Result  Value  Ref Range    hCG, Beta Chain, Quant, S  3  <5 mIU/mL   GLUCOSE, CAPILLARY Status: Abnormal    Collection Time    03/09/14 12:59 PM   Result  Value  Ref Range    Glucose-Capillary  131 (*)  70 - 99 mg/dL    Ct Head Wo Contrast  03/08/2014 CLINICAL DATA: Altered mental status EXAM: CT HEAD WITHOUT CONTRAST TECHNIQUE: Contiguous axial images were obtained from the base of the skull through the vertex without intravenous contrast. COMPARISON: None. FINDINGS: Extensive chronic ischemic changes are present. There is severe encephalomalacia in the right parietal lobe. Extensive chronic ischemic changes in the periventricular white matter. Encephalomalacia from focal infarcts in the left occipital and parietal lobes is also present. There is low-density in the gray matter and adjacent white matter of the right frontal lobe with effacement of the sulci and intermediate Hounsfield unit measurements. This has a subacute appearance. No evidence of acute intracranial hemorrhage. No midline shift. Mild global atrophy. IMPRESSION: Extensive chronic ischemic changes as described. 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 Electronically Signed By: Maryclare Bean M.D. On: 03/08/2014 18:04  Dg Chest Port 1 View  03/08/2014 CLINICAL DATA: Central line placement. EXAM: PORTABLE CHEST - 1 VIEW COMPARISON: 03/08/2014 FINDINGS: Prior median sternotomy and valve replacement. Endotracheal tube remains approximately 2 cm above the carina. Right central line is in place with the tip at the cavoatrial junction. No pneumothorax. Hyperinflation of the lungs. No confluent opacities. No effusions. No acute bony abnormality. IMPRESSION: Right central line tip at the cavoatrial junction. No pneumothorax. Mild hyperinflation. No acute cardiopulmonary disease. Electronically Signed By: Charlett Nose M.D. On: 03/08/2014 13:49  Dg Chest Portable 1 View  03/08/2014 CLINICAL DATA:  Intubation, central line placement EXAM: PORTABLE CHEST - 1 VIEW COMPARISON: Portable exam 1116 hr compared to 03/08/2014 at 0849 hr FINDINGS: Tip of endotracheal tube projects 2.2 cm above carinal. No central venous catheter identified. Normal heart size post AVR. Mediastinal contours and pulmonary vascularity normal. Lungs clear. No pleural effusion, pneumothorax or acute osseous findings. IMPRESSION: No acute abnormalities. Electronically Signed By: Ulyses Southward M.D. On: 03/08/2014 11:41  Dg Chest Portable 1 View  03/08/2014 CLINICAL DATA: Altered mental status. Hyperglycemia. EXAM: PORTABLE CHEST - 1 VIEW COMPARISON: None. FINDINGS: There has been previous median sternotomy and aortic valve replacement. Heart size is normal. The lungs are clear. The vascularity is normal. No effusions. IMPRESSION: Previous AVR. No active disease. Electronically Signed By: Paulina Fusi M.D. On: 03/08/2014 09:04  Dg Abd Portable 1v  03/08/2014 CLINICAL DATA: NG tube placement. EXAM: PORTABLE ABDOMEN - 1 VIEW COMPARISON: None. FINDINGS: NG tube coils in the fundus. The catheter then passes distally with the tip in the antrum of the stomach. Calcified fibroid in the pelvis. Nonobstructive bowel gas pattern. No free air organomegaly. IMPRESSION: NG tube coils in the fundus with the tip in the antrum. Electronically Signed By: Charlett Nose M.D. On: 03/08/2014 13:53   Assessment/Plan:  Diagnosis: New right frontal infarct superimposed on previous right MCA infarct  1. Does the need for close, 24 hr/day medical supervision in concert with the patient's rehab needs  make it unreasonable for this patient to be served in a less intensive setting? Yes 2. Co-Morbidities requiring supervision/potential complications: Diabetic ketoacidosis, type 1 diabetes, sepsis 3. Due to bladder management, bowel management, safety, skin/wound care, disease management, medication administration and pain management, does the patient require 24 hr/day  rehab nursing? Yes 4. Does the patient require coordinated care of a physician, rehab nurse, PT ( 1-2 hrs/day, 5 days/week) and OT (1-2 hrs/day, 5 days/week) to address physical and functional deficits in the context of the above medical diagnosis(es)? Yes Addressing deficits in the following areas: balance, endurance, locomotion, strength, transferring, bowel/bladder control, bathing, dressing and toileting 5. Can the patient actively participate in an intensive therapy program of at least 3 hrs of therapy per day at least 5 days per week? Yes 6. The potential for patient to make measurable gains while on inpatient rehab is good 7. Anticipated functional outcomes upon discharge from inpatient rehab are modified independent with PT, modified independent with OT, n/a with SLP. 8. Estimated rehab length of stay to reach the above functional goals is: 10-14 days 9. Does the patient have adequate social supports to accommodate these discharge functional goals? Potentially 10. Anticipated D/C setting: Home 11. Anticipated post D/C treatments: HH therapy 12. Overall Rehab/Functional Prognosis: good RECOMMENDATIONS:  This patient's condition is appropriate for continued rehabilitative care in the following setting: CIR  Patient has agreed to participate in recommended program. Yes  Note that insurance prior authorization may be required for reimbursement for recommended care.  Comment:  03/09/2014  Revision History...      Date/Time User Action    03/10/2014 12:16 PM Erick Colace, MD Sign    03/09/2014 3:35 PM Charlton Amor, PA-C [facility]   View Details Report    Routing History.Marland KitchenMarland Kitchen

## 2014-03-18 NOTE — Progress Notes (Signed)
Occupational Therapy Session Note  Patient Details  Name: Madison [name] MRN: [id number] Date of Birth: 1961-01-24  Today's Date: 03/18/2014 Time: 0900-0957 Time Calculation (min): 57 min  Short Term Goals: Week 1:  OT Short Term Goal 1 (Week 1): STGs equal to LTGs set at overall modified independent level.  Skilled Therapeutic Interventions/Progress Updates:    Pt performed bathing and dressing to begin session.  She ambulated with supervision around the room to gather her clothing, with use of the RW.  Performed all bathing sit to stand at the sink with overall supervision.  She needs min instructional cueing to lock her wheelchair brakes using the RUE.  Once she finished grooming with supervision had her gather her dirty clothes and carry them in a bag, using her walker down to the pt laundry room.  She then ambulated back to the room all with supervision as well.  Still with occasional min instructional cueing to stay inside of the walker.     Therapy Documentation Precautions:  Precautions Precautions: Fall Restrictions Weight Bearing Restrictions: No  Pain: Pain Assessment Pain Assessment: No/denies pain ADL: See FIM for current functional status  Therapy/Group: Individual Therapy  Delon Sacramento OTR/L 03/18/2014, 11:55 AM

## 2014-03-18 NOTE — Progress Notes (Addendum)
  Trying to see a pattern to glucose levels.  Over 300 this am-is this cbg taken possibly after pt has started eating breakfast. Otherwise, the cbg's are fairly well controlled. The supper 70/30 dose does not last throughout the night to cover basal needs.  But being on 70/30 at home, this too could be a battle for her. Thank you, Lenor Coffin, RN, CNS, Diabetes Coordinator [phone])

## 2014-03-18 NOTE — Progress Notes (Signed)
Coumadin and heparin per pharmacy  Anticoag: Heparin/Coumadin for AVR, HL 0.33 (goal 0.3-0.5), INR 2.17 << 1.99 << 1.9 (2.5-3.5 per Dr. Pearlean Brownie), earlier hadFOB+ but CCM ok with [facility]. Coumadin 5mg  PO daily PTA per pt but ?noncompliant (anticoag office note 03/01/14 notes dose as 7.5mg  x1 then 5mg  MWF, 2.5mg  TTSS), Hgb 10.1 stable, pltc stable. No bleeding  Heme: hgb 10.1 and Plt 283 K  Goal  HL: 0.3-0.5 units/mL INR 2.5-3.5 per Neuro   Plan:  - Coumadin 10 mg PO x1  - Continue heparin at 950 units/hr - Daily HL / CBC / PT / INR - Monitor CBGs - Coumadin was educated on 03/11/14

## 2014-03-19 ENCOUNTER — Inpatient Hospital Stay (HOSPITAL_COMMUNITY): Payer: Commercial Managed Care - HMO | Admitting: Rehabilitation

## 2014-03-19 ENCOUNTER — Encounter (HOSPITAL_COMMUNITY): Payer: Commercial Managed Care - HMO | Admitting: Occupational Therapy

## 2014-03-19 ENCOUNTER — Inpatient Hospital Stay (HOSPITAL_COMMUNITY): Payer: Commercial Managed Care - HMO | Admitting: Occupational Therapy

## 2014-03-19 DIAGNOSIS — I6350 Cerebral infarction due to unspecified occlusion or stenosis of unspecified cerebral artery: Secondary | ICD-10-CM

## 2014-03-19 LAB — GLUCOSE, CAPILLARY
GLUCOSE-CAPILLARY: 108 mg/dL — AB (ref 70–99)
Glucose-Capillary: 262 mg/dL — ABNORMAL HIGH (ref 70–99)
Glucose-Capillary: 137 mg/dL — ABNORMAL HIGH (ref 70–99)
Glucose-Capillary: 232 mg/dL — ABNORMAL HIGH (ref 70–99)

## 2014-03-19 LAB — CBC
HCT: 33.1 % — ABNORMAL LOW (ref 36.0–46.0)
Hemoglobin: 10.8 g/dL — ABNORMAL LOW (ref 12.0–15.0)
MCH: 28.3 pg (ref 26.0–34.0)
MCHC: 32.6 g/dL (ref 30.0–36.0)
MCV: 86.6 fL (ref 78.0–100.0)
Platelets: 323 10*3/uL (ref 150–400)
RBC: 3.82 MIL/uL — ABNORMAL LOW (ref 3.87–5.11)
RDW: 14.2 % (ref 11.5–15.5)
WBC: 5.4 10*3/uL (ref 4.0–10.5)

## 2014-03-19 LAB — PROTIME-INR
INR: 2.24 — AB (ref 0.00–1.49)
Prothrombin Time: 24.1 seconds — ABNORMAL HIGH (ref 11.6–15.2)

## 2014-03-19 LAB — HEPARIN LEVEL (UNFRACTIONATED): Heparin Unfractionated: 0.53 IU/mL (ref 0.30–0.70)

## 2014-03-19 MED ORDER — WARFARIN SODIUM 10 MG PO TABS
10.0000 mg | ORAL_TABLET | Freq: Once | ORAL | Status: AC
  Administered 2014-03-19: 10 mg via ORAL
  Filled 2014-03-19: qty 1

## 2014-03-19 NOTE — Progress Notes (Signed)
Subjective/Complaints: 53 y.o. right-handed female with history of diabetes mellitus, hypertension, CAD with CABG as well as aortic valve replacement on chronic Coumadin. Patient was independent prior to admission with ADLs and mobility. Admitted 03/08/2014 with altered mental status, hypoglycemia and acute respiratory failure. Patient was intubated in the emergency room. Noted blood sugar of 1124 as well as potassium 7.2. 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 INR on admission of 1.37. Echocardiogram and carotid Dopplers are pending. Critical medicine consulted for severe DKA metabolic acidosis hyperkalemia. Placed on intravenous fluids. Patient was extubated 03/09/2014  Slept ok  Review of Systems - Negative except left side weak, prior stroke  Objective: Vital Signs: Blood pressure 102/67, pulse 62, temperature 97.5 F (36.4 C), temperature source Oral, resp. rate 18, height 5\' 3"  (1.6 m), weight 66.6 kg (146 lb 13.2 oz), SpO2 99.00%. No results found. Results for orders placed during the hospital encounter of 03/12/14 (from the past 72 hour(s))  GLUCOSE, CAPILLARY     Status: Abnormal   Collection Time    03/16/14 11:27 AM      Result Value Ref Range   Glucose-Capillary 190 (*) 70 - 99 mg/dL   Comment 1 Notify RN    GLUCOSE, CAPILLARY     Status: Abnormal   Collection Time    03/16/14  4:17 PM      Result Value Ref Range   Glucose-Capillary 214 (*) 70 - 99 mg/dL   Comment 1 Notify RN    GLUCOSE, CAPILLARY     Status: None   Collection Time    03/16/14  8:43 PM      Result Value Ref Range   Glucose-Capillary 99  70 - 99 mg/dL   Comment 1 Notify RN    PROTIME-INR     Status: Abnormal   Collection Time    03/17/14  4:55 AM      Result Value Ref Range   Prothrombin Time 22.0 (*) 11.6 - 15.2 seconds   INR 1.99 (*) 0.00 - 1.49  HEPARIN LEVEL (UNFRACTIONATED)     Status: None   Collection Time     03/17/14  4:55 AM      Result Value Ref Range   Heparin Unfractionated 0.58  0.30 - 0.70 IU/mL   Comment:            IF HEPARIN RESULTS ARE BELOW     EXPECTED VALUES, AND PATIENT     DOSAGE HAS BEEN CONFIRMED,     SUGGEST FOLLOW UP TESTING     OF ANTITHROMBIN III LEVELS.  CBC     Status: Abnormal   Collection Time    03/17/14  4:55 AM      Result Value Ref Range   WBC 5.6  4.0 - 10.5 K/uL   RBC 3.54 (*) 3.87 - 5.11 MIL/uL   Hemoglobin 10.0 (*) 12.0 - 15.0 g/dL   HCT 37.6 (*) 28.3 - 15.1 %   MCV 87.3  78.0 - 100.0 fL   MCH 28.2  26.0 - 34.0 pg   MCHC 32.4  30.0 - 36.0 g/dL   RDW 76.1  60.7 - 37.1 %   Platelets 288  150 - 400 K/uL  GLUCOSE, CAPILLARY     Status: Abnormal   Collection Time    03/17/14  7:27 AM      Result Value Ref Range   Glucose-Capillary 159 (*) 70 - 99 mg/dL  Comment 1 Notify RN    GLUCOSE, CAPILLARY     Status: Abnormal   Collection Time    03/17/14 11:22 AM      Result Value Ref Range   Glucose-Capillary 207 (*) 70 - 99 mg/dL   Comment 1 Notify RN    HEPARIN LEVEL (UNFRACTIONATED)     Status: None   Collection Time    03/17/14  4:20 PM      Result Value Ref Range   Heparin Unfractionated 0.41  0.30 - 0.70 IU/mL   Comment:            IF HEPARIN RESULTS ARE BELOW     EXPECTED VALUES, AND PATIENT     DOSAGE HAS BEEN CONFIRMED,     SUGGEST FOLLOW UP TESTING     OF ANTITHROMBIN III LEVELS.  GLUCOSE, CAPILLARY     Status: Abnormal   Collection Time    03/17/14  4:20 PM      Result Value Ref Range   Glucose-Capillary 145 (*) 70 - 99 mg/dL   Comment 1 Notify RN    GLUCOSE, CAPILLARY     Status: Abnormal   Collection Time    03/17/14  9:11 PM      Result Value Ref Range   Glucose-Capillary 173 (*) 70 - 99 mg/dL  PROTIME-INR     Status: Abnormal   Collection Time    03/18/14  6:13 AM      Result Value Ref Range   Prothrombin Time 23.5 (*) 11.6 - 15.2 seconds   INR 2.17 (*) 0.00 - 1.49  HEPARIN LEVEL (UNFRACTIONATED)     Status: None    Collection Time    03/18/14  6:13 AM      Result Value Ref Range   Heparin Unfractionated 0.33  0.30 - 0.70 IU/mL   Comment:            IF HEPARIN RESULTS ARE BELOW     EXPECTED VALUES, AND PATIENT     DOSAGE HAS BEEN CONFIRMED,     SUGGEST FOLLOW UP TESTING     OF ANTITHROMBIN III LEVELS.  CBC     Status: Abnormal   Collection Time    03/18/14  6:13 AM      Result Value Ref Range   WBC 5.5  4.0 - 10.5 K/uL   RBC 3.58 (*) 3.87 - 5.11 MIL/uL   Hemoglobin 10.1 (*) 12.0 - 15.0 g/dL   HCT 92.4 (*) 26.8 - 34.1 %   MCV 86.6  78.0 - 100.0 fL   MCH 28.2  26.0 - 34.0 pg   MCHC 32.6  30.0 - 36.0 g/dL   RDW 96.2  22.9 - 79.8 %   Platelets 283  150 - 400 K/uL  GLUCOSE, CAPILLARY     Status: Abnormal   Collection Time    03/18/14  7:12 AM      Result Value Ref Range   Glucose-Capillary 340 (*) 70 - 99 mg/dL   Comment 1 Notify RN    GLUCOSE, CAPILLARY     Status: Abnormal   Collection Time    03/18/14 11:31 AM      Result Value Ref Range   Glucose-Capillary 100 (*) 70 - 99 mg/dL   Comment 1 Notify RN    GLUCOSE, CAPILLARY     Status: Abnormal   Collection Time    03/18/14  4:33 PM      Result Value Ref Range   Glucose-Capillary 193 (*) 70 -  99 mg/dL   Comment 1 Notify RN    GLUCOSE, CAPILLARY     Status: Abnormal   Collection Time    03/18/14  8:53 PM      Result Value Ref Range   Glucose-Capillary 138 (*) 70 - 99 mg/dL   Comment 1 Notify RN    PROTIME-INR     Status: Abnormal   Collection Time    03/19/14  5:35 AM      Result Value Ref Range   Prothrombin Time 24.1 (*) 11.6 - 15.2 seconds   INR 2.24 (*) 0.00 - 1.49  HEPARIN LEVEL (UNFRACTIONATED)     Status: None   Collection Time    03/19/14  5:35 AM      Result Value Ref Range   Heparin Unfractionated 0.53  0.30 - 0.70 IU/mL   Comment:            IF HEPARIN RESULTS ARE BELOW     EXPECTED VALUES, AND PATIENT     DOSAGE HAS BEEN CONFIRMED,     SUGGEST FOLLOW UP TESTING     OF ANTITHROMBIN III LEVELS.  CBC      Status: Abnormal   Collection Time    03/19/14  5:35 AM      Result Value Ref Range   WBC 5.4  4.0 - 10.5 K/uL   RBC 3.82 (*) 3.87 - 5.11 MIL/uL   Hemoglobin 10.8 (*) 12.0 - 15.0 g/dL   HCT 49.2 (*) 01.0 - 07.1 %   MCV 86.6  78.0 - 100.0 fL   MCH 28.3  26.0 - 34.0 pg   MCHC 32.6  30.0 - 36.0 g/dL   RDW 21.9  75.8 - 83.2 %   Platelets 323  150 - 400 K/uL     HEENT: normal Cardio: RRR and no murmur Resp: CTA B/L and unlabored GI: BS positive and ND Extremity:  Pulses positive and No Edema Skin:   Intact Neuro: Alert/Oriented, Cranial Nerve Abnormalities Left central 7, Abnormal Motor 3-/5 Left delt bi, tri, grip, HF, KE ADF, Abnormal FMC Ataxic/ dec FMC and Tone  Hypertonia, Tone:  left side increased flexor tone in UE and Dysarthric Musc/Skel:  Normal Gen NAD   Assessment/Plan: 1. Functional deficits secondary to acute /subacute anterior Right  frontal ischemic stroke which require 3+ hours per day of interdisciplinary therapy in a comprehensive inpatient rehab setting. Physiatrist is providing close team supervision and 24 hour management of active medical problems listed below. Physiatrist and rehab team continue to assess barriers to discharge/monitor patient progress toward functional and medical goals. At sup level for therapy but still on IV heparin, INR slowly climbing but may need heparin another 2-3 days FIM: FIM - Bathing Bathing Steps Patient Completed: Chest;Left upper leg;Right Arm;Right lower leg (including foot);Left lower leg (including foot);Left Arm;Abdomen;Front perineal area;Buttocks;Right upper leg Bathing: 5: Supervision: Safety issues/verbal cues  FIM - Upper Body Dressing/Undressing Upper body dressing/undressing steps patient completed: Thread/unthread right sleeve of pullover shirt/dresss;Thread/unthread left sleeve of pullover shirt/dress;Put head through opening of pull over shirt/dress;Pull shirt over trunk Upper body dressing/undressing: 5:  Supervision: Safety issues/verbal cues FIM - Lower Body Dressing/Undressing Lower body dressing/undressing steps patient completed: Thread/unthread right underwear leg;Thread/unthread left underwear leg;Pull underwear up/down;Don/Doff right sock;Don/Doff left sock;Pull pants up/down;Thread/unthread right pants leg;Thread/unthread left pants leg Lower body dressing/undressing: 5: Supervision: Safety issues/verbal cues  FIM - Toileting Toileting steps completed by patient: Adjust clothing prior to toileting;Performs perineal hygiene;Adjust clothing after toileting Toileting Assistive Devices: Grab bar  or rail for support Toileting: 5: Supervision: Safety issues/verbal cues  FIM - Diplomatic Services operational officer Devices: Elevated toilet seat;Grab bars Toilet Transfers: 5-From toilet/BSC: Supervision (verbal cues/safety issues)  FIM - Banker Devices: Bed rails Bed/Chair Transfer: 0: Activity did not occur  FIM - Locomotion: Wheelchair Locomotion: Wheelchair: 0: Activity did not occur FIM - Locomotion: Ambulation Locomotion: Ambulation Assistive Devices: Other (comment) (no AD) Ambulation/Gait Assistance: 5: Supervision Locomotion: Ambulation: 5: Travels 150 ft or more with supervision/safety issues  Comprehension Comprehension Mode: Auditory Comprehension: 7-Follows complex conversation/direction: With no assist  Expression Expression Mode: Verbal Expression: 6-Expresses complex ideas: With extra time/assistive device  Social Interaction Social Interaction: 7-Interacts appropriately with others - No medications needed.  Problem Solving Problem Solving: 6-Solves complex problems: With extra time  Memory Memory: 7-Complete Independence: No helper  Medical Problem List and Plan:  1. 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 DVT Prophylaxis/Anticoagulation:  Heparin Coumadin therapy with history of aortic valve replacement. INR goal 2.5-3.5. Once goal is reached and maintained will d/c central line 3. Pain Management: Tylenol as needed.  4. Neuropsych: This patient is capable of making decisions on her own behalf.  5. Diabetes mellitus with peripheral neuropathy. Hemoglobin A1c pending. 70/30 insulin will titrate  Check blood sugars a.c. and at bedtime  6. CAD with CABG. Aspirin 81 mg daily. No chest pain or shortness of breath  7. Hyperlipidemia. Zocor  8. Insomnia.Rozerem    LOS (Days) 7 A FACE TO FACE EVALUATION WAS PERFORMED  Erick Colace 03/19/2014, 7:53 AM

## 2014-03-19 NOTE — Progress Notes (Signed)
Occupational Therapy Session Note  Patient Details  Name: Madison [name] MRN: [id number] Date of Birth: 1961-03-04  Today's Date: 03/19/2014 Time: 0900-0959 Time Calculation (min): 59 min  Short Term Goals: Week 1:  OT Short Term Goal 1 (Week 1): STGs equal to LTGs set at overall modified independent level.  Skilled Therapeutic Interventions/Progress Updates:    Pt worked on bathing and dressing for OT session.  She was able to gather all of her clothes as well as washcloth and towels with supervision using the RW.  Min instructional cueing for correct placement of the walker as she tends to push it to the side when reaching for her towels and washcloth.  She was able to perform all bathing and dressing with overall supervision including all grooming tasks.  Used the RW with supervision as well at end of session to gather up her dirty clothes and laundry and place them in the appropriate places.   Therapy Documentation Precautions:  Precautions Precautions: Fall Restrictions Weight Bearing Restrictions: No  Pain: Pain Assessment Pain Assessment: Faces Pain Score: 1  Pain Type: Acute pain Pain Location: Neck Pain Intervention(s): Repositioned ADL: See FIM for current functional status  Therapy/Group: Individual Therapy  Delon Sacramento OTR/L 03/19/2014, 12:24 PM

## 2014-03-19 NOTE — Progress Notes (Signed)
Physical Therapy Session Note  Patient Details  Name: Madison [name] MRN: [id number] Date of Birth: Dec 26, 1960  Today's Date: 03/19/2014 Time: 0900-0959 Time Calculation (min): 59 min  Short Term Goals: Week 1:  PT Short Term Goal 1 (Week 1): =LTG secondary to short LOS  Skilled Therapeutic Interventions/Progress Updates:   Pt received sitting EOB, donning pants following soiling current clothes due to urinary urgency.  Pt able to thread BLEs and stand to pull up pants at supervision to Mod I level.  Pt ambulated to/from gym without AD at supervision level with min cues for upright posture and increased hip/knee flex on LLE.  Once in gym, performed tall kneeling activity to increase WB and weight shift to LLE/LUE while reaching and placing clothes pins to the L.  Transitioned to quadruped while reaching for clothes pins to the L with focus on increased WB through LUE.  Requires assist for maintaining hand flat on LLE due to index finger wanting to bend.  Then ambulated to ADL kitchen in order to work on standing balance while reaching in cabinets for lighter items with LUE with focus on opening/closing hand.  She was able to perform with smaller objects, however did have trouble with larger items such as salt container.  Ended session with seated nustep x 7 mins total alternating between LUE/LLE only to LUE/LLE/RLE with level 1 resistance and level 4 resistance.  Tolerated well and ambulated back to room as stated above.  All needs in reach and left in w/c.   Therapy Documentation Precautions:  Precautions Precautions: Fall Restrictions Weight Bearing Restrictions: No   Pain:No pain during PT session.    Locomotion : Ambulation Ambulation/Gait Assistance: 5: Supervision   See FIM for current functional status  Therapy/Group: Individual Therapy  Irving Burton A Fred Franzen 03/19/2014, 2:06 PM

## 2014-03-19 NOTE — Progress Notes (Signed)
Physical Therapy Session Note  Patient Details  Name: Steph Cheadle MRN: [id number] Date of Birth: 02-21-1961  Today's Date: 03/19/2014 Time: 1660-6301 Time Calculation (min): 57 min  Short Term Goals: Week 1:  PT Short Term Goal 1 (Week 1): =LTG secondary to short LOS  Skilled Therapeutic Interventions/Progress Updates:   Pt received sitting in w/c in room, making phone call.  Note that she is very concerned about not having child care for her 53 year old over the weekend and is trying to think of family members that could watch him until she is D/C'd on Monday.  Focus of session was community ambulation downstairs in controlled and busy environment and in tight spaces (gift shop).   While in gift shop, she was able to perform dynamic standing balance with reaching and bending down in order to gather and look at objects in gift shop all at supervision level.  She was also very safe in using RW for single UE support while reaching down for object without verbal cues.  Ambulated >500' total with RW at supervision level.  Ambulated back to rehab floor and ended session with seated nustep activity with BUE/LE for overall strengthening, endurance and NMR for LLE.  Tolerated 5 mins at level 4 resistance.  Ambulated back to room and left in w/c with all needs in reach.   Therapy Documentation Precautions:  Precautions Precautions: Fall Restrictions Weight Bearing Restrictions: No   Pain: Pain Assessment Pain Assessment: Faces Pain Score: 1  Pain Type: Acute pain Pain Location: Neck Pain Intervention(s): Repositioned   Locomotion : Ambulation Ambulation/Gait Assistance: 5: Supervision   See FIM for current functional status  Therapy/Group: Individual Therapy  Irving Burton A Chloie Loney 03/19/2014, 12:19 PM

## 2014-03-19 NOTE — Progress Notes (Signed)
Occupational Therapy Session Note  Patient Details  Name: Madison [name] MRN: [id number] Date of Birth: 10-21-1961  Today's Date: 03/19/2014 Time: 6433-2951 Time Calculation (min): 28 min  Skilled Therapeutic Interventions/Progress Updates:    Pt ambulated down to the laundry room with supervision and no assistive device to retrieve her clothes from the dryer.  She returned them to her room where she worked on static standing while folding clothing.  Incorporated use of the LUE during folding task with supervision.  Once finished she used the rolling table to transfer the clothes over to the drawers to be placed.  Returned to wheelchair after conclusion of task.  Pt making great progress.  Still with RUE and RLE weakness secondary to old CVA.  Therapy Documentation Precautions:  Precautions Precautions: Fall Restrictions Weight Bearing Restrictions: No  Pain: Pain Assessment Pain Assessment: Faces Faces Pain Scale: Hurts a little bit Pain Type: Acute pain Pain Location: Neck Pain Orientation: Right Pain Intervention(s): Repositioned ADL: See FIM for current functional status  Therapy/Group: Individual Therapy  Delon Sacramento OTR/L 03/19/2014, 4:08 PM

## 2014-03-19 NOTE — Progress Notes (Signed)
ANTICOAGULATION CONSULT NOTE - Follow Up Consult  Pharmacy Consult for Heparin and Coumadin Indication: AVR  Allergies  Allergen Reactions  . Celebrex [Celecoxib] Rash  . Detrol [Tolterodine]     Patient Measurements: Height: 5\' 3"  (160 cm) Weight: 146 lb 13.2 oz (66.6 kg) IBW/kg (Calculated) : 52.4 Heparin Dosing Weight:   Vital Signs: Temp: 97.5 F (36.4 C) (05/01 0513) Temp src: Oral (05/01 0513) BP: 102/67 mmHg (05/01 0513) Pulse Rate: 62 (05/01 0513)  Labs:  Recent Labs  03/17/14 0455 03/17/14 1620 03/18/14 0613 03/19/14 0535  HGB 10.0*  --  10.1* 10.8*  HCT 30.9*  --  31.0* 33.1*  PLT 288  --  283 323  LABPROT 22.0*  --  23.5* 24.1*  INR 1.99*  --  2.17* 2.24*  HEPARINUNFRC 0.58 0.41 0.33 0.53    Estimated Creatinine Clearance: 74.6 ml/min (by C-G formula based on Cr of 0.72).   Medications:  Scheduled:  . aspirin  81 mg Oral Daily  . baclofen  10 mg Oral BID  . darifenacin  7.5 mg Oral Daily  . escitalopram  5 mg Oral Daily  . insulin aspart  0-15 Units Subcutaneous TID WC  . insulin aspart protamine- aspart  20 Units Subcutaneous BID WC  . pantoprazole  40 mg Oral Q1200  . ramelteon  8 mg Oral QHS  . simvastatin  10 mg Oral q1800  . Warfarin - Pharmacist Dosing Inpatient   Does not apply q1800    Assessment: 53yo female with AVR, on heparin bridge at 950 units/hr and Coumadin with INR approaching goal.  INR 2.24 this AM, Heparin level 0.53.  Hg 10.8 and pltc wnl.  No bleeding problems noted.  Heparin level with significant increase & no rate change.  Goal of Therapy:  Heparin level 0.3-0.5 units/ml INR 2.5-3.5 Monitor platelets by anticoagulation protocol: Yes   Plan:  1-  Repeat Coumadin 10mg  today 2-  Decrease Heparin to 900 units/hr 3-  Continue daily HL, INR, CBC 4-  F/U in AM  10-10-1989, PharmD Clinical Pharmacist [facility]

## 2014-03-19 NOTE — Progress Notes (Signed)
Social Work Patient ID: Madison [name], female   DOB: 1961-10-12, 53 y.o.   MRN: [id number] Met with pt niece found her glasses at home which is a relieve.  She is feeling more prepared to go home on Monday if INR is at appropriate level.

## 2014-03-20 ENCOUNTER — Inpatient Hospital Stay (HOSPITAL_COMMUNITY): Payer: Medicare HMO | Admitting: Occupational Therapy

## 2014-03-20 ENCOUNTER — Inpatient Hospital Stay (HOSPITAL_COMMUNITY): Payer: Commercial Managed Care - HMO

## 2014-03-20 DIAGNOSIS — I6350 Cerebral infarction due to unspecified occlusion or stenosis of unspecified cerebral artery: Secondary | ICD-10-CM

## 2014-03-20 LAB — CBC
HCT: 28.7 % — ABNORMAL LOW (ref 36.0–46.0)
HEMATOCRIT: 32.8 % — AB (ref 36.0–46.0)
HEMOGLOBIN: 7.5 g/dL — AB (ref 12.0–15.0)
Hemoglobin: 10.8 g/dL — ABNORMAL LOW (ref 12.0–15.0)
MCH: 22.8 pg — ABNORMAL LOW (ref 26.0–34.0)
MCH: 28.3 pg (ref 26.0–34.0)
MCHC: 26.1 g/dL — AB (ref 30.0–36.0)
MCHC: 32.9 g/dL (ref 30.0–36.0)
MCV: 87.2 fL (ref 78.0–100.0)
MCV: 85.9 fL (ref 78.0–100.0)
Platelets: 297 10*3/uL (ref 150–400)
Platelets: 372 10*3/uL (ref 150–400)
RBC: 3.29 MIL/uL — AB (ref 3.87–5.11)
RBC: 3.82 MIL/uL — ABNORMAL LOW (ref 3.87–5.11)
RDW: 14.2 % (ref 11.5–15.5)
RDW: 14 % (ref 11.5–15.5)
WBC: 5.5 10*3/uL (ref 4.0–10.5)
WBC: 6.5 10*3/uL (ref 4.0–10.5)

## 2014-03-20 LAB — PROTIME-INR
INR: 2.57 — ABNORMAL HIGH (ref 0.00–1.49)
INR: 2.8 — ABNORMAL HIGH (ref 0.00–1.49)
Prothrombin Time: 26.7 seconds — ABNORMAL HIGH (ref 11.6–15.2)
Prothrombin Time: 28.5 seconds — ABNORMAL HIGH (ref 11.6–15.2)

## 2014-03-20 LAB — GLUCOSE, CAPILLARY
GLUCOSE-CAPILLARY: 273 mg/dL — AB (ref 70–99)
GLUCOSE-CAPILLARY: 150 mg/dL — AB (ref 70–99)
GLUCOSE-CAPILLARY: 133 mg/dL — AB (ref 70–99)
GLUCOSE-CAPILLARY: 130 mg/dL — AB (ref 70–99)

## 2014-03-20 LAB — HEPARIN LEVEL (UNFRACTIONATED): HEPARIN UNFRACTIONATED: 0.34 [IU]/mL (ref 0.30–0.70)

## 2014-03-20 MED ORDER — WARFARIN SODIUM 7.5 MG PO TABS
7.5000 mg | ORAL_TABLET | Freq: Once | ORAL | Status: AC
  Administered 2014-03-20: 7.5 mg via ORAL
  Filled 2014-03-20: qty 1

## 2014-03-20 NOTE — Progress Notes (Addendum)
ANTICOAGULATION CONSULT NOTE - Follow Up Consult  Pharmacy Consult for Heparin and Coumadin Indication: AVR  Allergies  Allergen Reactions  . Celebrex [Celecoxib] Rash  . Detrol [Tolterodine]     Patient Measurements: Height: 5\' 3"  (160 cm) Weight: 147 lb 11.3 oz (67 kg) IBW/kg (Calculated) : 52.4   Vital Signs: Temp: 97.6 F (36.4 C) (05/02 0516) Temp src: Oral (05/02 0516) BP: 112/57 mmHg (05/02 0516) Pulse Rate: 66 (05/02 0516)  Labs:  Recent Labs  03/18/14 0613 03/19/14 0535 03/20/14 0600  HGB 10.1* 10.8* 7.5*  HCT 31.0* 33.1* 28.7*  PLT 283 323 297  LABPROT 23.5* 24.1* 26.7*  INR 2.17* 2.24* 2.57*  HEPARINUNFRC 0.33 0.53 0.34    Estimated Creatinine Clearance: 74.7 ml/min (by C-G formula based on Cr of 0.72).  Assessment: 53yo female with AVR, on heparin bridge at 900 units/hr and Coumadin with INR therapeutic for first day.  INR 2.57 this AM, Heparin level 0.34.  Hg down to 7.5 from 10.8 and pltc wnl.  No bleeding problems noted.  Clinically stable.  I called Dr. 53yo about drop in Hg and therapeutic INR.  Goal of Therapy:  INR 2.5 - 3.5   Plan:  1. Dc heparin drip as INR is thereapeutic at 2.57 2. Repeat CBC to confirm drop in Hg and repeat INR to confirm tx 3.  Order coumadin after repeat labs resulted 4. DC daily HL 5. Continue daily CBC and INR Wynn Banker, Pharm.D. Herby Abraham 03/20/2014 1:39 PM   Addendum: repeat CBC Hg = 10.8 so 7.5 must be lab error. Repeat INR 2.80. Plan: coumadin 7.5 mg po x 1 dose today 05/20/2014, Pharm.D. Herby Abraham 03/20/2014 3:26 PM

## 2014-03-20 NOTE — Progress Notes (Signed)
Occupational Therapy Session Note  Patient Details  Name: Madison [name] MRN: [id number] Date of Birth: [birth year]  Today's Date: 03/20/2014 Time: 1125-1215 Time Calculation (min): 50 min  Skilled Therapeutic Interventions/Progress Updates: ADL in room with skilled OT to help patient stand in shower and use Long handled hose only on her periarea/buttocks.  This clinician held the hose.  Otherwise, patient sat in w/c to wash and dress upper body with extra time.  She was able to manage the IV pole today safely completing functional ambulation to gather clothing and set up for bathing.    Therapy Documentation Precautions:  Precautions Precautions: Fall Restrictions Weight Bearing Restrictions: No  Pain: Pain Assessment Pain Assessment: No/denies pain Pain Score: 0-No pain See FIM for current functional status  Therapy/Group: Individual Therapy  Rozelle Logan 03/20/2014, 4:55 PM

## 2014-03-20 NOTE — Progress Notes (Signed)
Subjective/Complaints: 53 y.o. right-handed female with history of diabetes mellitus, hypertension, CAD with CABG as well as aortic valve replacement on chronic Coumadin. Patient was independent prior to admission with ADLs and mobility. Admitted 03/08/2014 with altered mental status, hypoglycemia and acute respiratory failure. Patient was intubated in the emergency room. Noted blood sugar of 1124 as well as potassium 7.2. 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 INR on admission of 1.37. Echocardiogram and carotid Dopplers are pending. Critical medicine consulted for severe DKA metabolic acidosis hyperkalemia. Placed on intravenous fluids. Patient was extubated 03/09/2014  No c/o spoke to IV nurse , just completed drsg change to central line, suture sites pink  Review of Systems - Negative except left side weak, prior stroke  Objective: Vital Signs: Blood pressure 112/57, pulse 66, temperature 97.6 F (36.4 C), temperature source Oral, resp. rate 18, height 5\' 3"  (1.6 m), weight 67 kg (147 lb 11.3 oz), SpO2 97.00%. No results found. Results for orders placed during the hospital encounter of 03/12/14 (from the past 72 hour(s))  GLUCOSE, CAPILLARY     Status: Abnormal   Collection Time    03/17/14 11:22 AM      Result Value Ref Range   Glucose-Capillary 207 (*) 70 - 99 mg/dL   Comment 1 Notify RN    HEPARIN LEVEL (UNFRACTIONATED)     Status: None   Collection Time    03/17/14  4:20 PM      Result Value Ref Range   Heparin Unfractionated 0.41  0.30 - 0.70 IU/mL   Comment:            IF HEPARIN RESULTS ARE BELOW     EXPECTED VALUES, AND PATIENT     DOSAGE HAS BEEN CONFIRMED,     SUGGEST FOLLOW UP TESTING     OF ANTITHROMBIN III LEVELS.  GLUCOSE, CAPILLARY     Status: Abnormal   Collection Time    03/17/14  4:20 PM      Result Value Ref Range   Glucose-Capillary 145 (*) 70 - 99 mg/dL   Comment 1  Notify RN    GLUCOSE, CAPILLARY     Status: Abnormal   Collection Time    03/17/14  9:11 PM      Result Value Ref Range   Glucose-Capillary 173 (*) 70 - 99 mg/dL  PROTIME-INR     Status: Abnormal   Collection Time    03/18/14  6:13 AM      Result Value Ref Range   Prothrombin Time 23.5 (*) 11.6 - 15.2 seconds   INR 2.17 (*) 0.00 - 1.49  HEPARIN LEVEL (UNFRACTIONATED)     Status: None   Collection Time    03/18/14  6:13 AM      Result Value Ref Range   Heparin Unfractionated 0.33  0.30 - 0.70 IU/mL   Comment:            IF HEPARIN RESULTS ARE BELOW     EXPECTED VALUES, AND PATIENT     DOSAGE HAS BEEN CONFIRMED,     SUGGEST FOLLOW UP TESTING     OF ANTITHROMBIN III LEVELS.  CBC     Status: Abnormal   Collection Time    03/18/14  6:13 AM      Result Value Ref Range   WBC 5.5  4.0 - 10.5 K/uL   RBC 3.58 (*) 3.87 - 5.11 MIL/uL   Hemoglobin 10.1 (*)  12.0 - 15.0 g/dL   HCT 88.7 (*) 57.9 - 72.8 %   MCV 86.6  78.0 - 100.0 fL   MCH 28.2  26.0 - 34.0 pg   MCHC 32.6  30.0 - 36.0 g/dL   RDW 20.6  01.5 - 61.5 %   Platelets 283  150 - 400 K/uL  GLUCOSE, CAPILLARY     Status: Abnormal   Collection Time    03/18/14  7:12 AM      Result Value Ref Range   Glucose-Capillary 340 (*) 70 - 99 mg/dL   Comment 1 Notify RN    GLUCOSE, CAPILLARY     Status: Abnormal   Collection Time    03/18/14 11:31 AM      Result Value Ref Range   Glucose-Capillary 100 (*) 70 - 99 mg/dL   Comment 1 Notify RN    GLUCOSE, CAPILLARY     Status: Abnormal   Collection Time    03/18/14  4:33 PM      Result Value Ref Range   Glucose-Capillary 193 (*) 70 - 99 mg/dL   Comment 1 Notify RN    GLUCOSE, CAPILLARY     Status: Abnormal   Collection Time    03/18/14  8:53 PM      Result Value Ref Range   Glucose-Capillary 138 (*) 70 - 99 mg/dL   Comment 1 Notify RN    PROTIME-INR     Status: Abnormal   Collection Time    03/19/14  5:35 AM      Result Value Ref Range   Prothrombin Time 24.1 (*) 11.6 - 15.2  seconds   INR 2.24 (*) 0.00 - 1.49  HEPARIN LEVEL (UNFRACTIONATED)     Status: None   Collection Time    03/19/14  5:35 AM      Result Value Ref Range   Heparin Unfractionated 0.53  0.30 - 0.70 IU/mL   Comment:            IF HEPARIN RESULTS ARE BELOW     EXPECTED VALUES, AND PATIENT     DOSAGE HAS BEEN CONFIRMED,     SUGGEST FOLLOW UP TESTING     OF ANTITHROMBIN III LEVELS.  CBC     Status: Abnormal   Collection Time    03/19/14  5:35 AM      Result Value Ref Range   WBC 5.4  4.0 - 10.5 K/uL   RBC 3.82 (*) 3.87 - 5.11 MIL/uL   Hemoglobin 10.8 (*) 12.0 - 15.0 g/dL   HCT 37.9 (*) 43.2 - 76.1 %   MCV 86.6  78.0 - 100.0 fL   MCH 28.3  26.0 - 34.0 pg   MCHC 32.6  30.0 - 36.0 g/dL   RDW 47.0  92.9 - 57.4 %   Platelets 323  150 - 400 K/uL  GLUCOSE, CAPILLARY     Status: Abnormal   Collection Time    03/19/14  7:47 AM      Result Value Ref Range   Glucose-Capillary 262 (*) 70 - 99 mg/dL  GLUCOSE, CAPILLARY     Status: Abnormal   Collection Time    03/19/14 11:51 AM      Result Value Ref Range   Glucose-Capillary 137 (*) 70 - 99 mg/dL  GLUCOSE, CAPILLARY     Status: Abnormal   Collection Time    03/19/14  3:59 PM      Result Value Ref Range   Glucose-Capillary 232 (*) 70 - 99  mg/dL  GLUCOSE, CAPILLARY     Status: Abnormal   Collection Time    03/19/14  7:53 PM      Result Value Ref Range   Glucose-Capillary 108 (*) 70 - 99 mg/dL  PROTIME-INR     Status: Abnormal   Collection Time    03/20/14  6:00 AM      Result Value Ref Range   Prothrombin Time 26.7 (*) 11.6 - 15.2 seconds   INR 2.57 (*) 0.00 - 1.49  HEPARIN LEVEL (UNFRACTIONATED)     Status: None   Collection Time    03/20/14  6:00 AM      Result Value Ref Range   Heparin Unfractionated 0.34  0.30 - 0.70 IU/mL   Comment:            IF HEPARIN RESULTS ARE BELOW     EXPECTED VALUES, AND PATIENT     DOSAGE HAS BEEN CONFIRMED,     SUGGEST FOLLOW UP TESTING     OF ANTITHROMBIN III LEVELS.  CBC     Status: Abnormal    Collection Time    03/20/14  6:00 AM      Result Value Ref Range   WBC 5.5  4.0 - 10.5 K/uL   RBC 3.29 (*) 3.87 - 5.11 MIL/uL   Hemoglobin 7.5 (*) 12.0 - 15.0 g/dL   Comment: DELTA CHECK NOTED   HCT 28.7 (*) 36.0 - 46.0 %   MCV 87.2  78.0 - 100.0 fL   MCH 22.8 (*) 26.0 - 34.0 pg   MCHC 26.1 (*) 30.0 - 36.0 g/dL   RDW 93.7  90.2 - 40.9 %   Platelets 297  150 - 400 K/uL  GLUCOSE, CAPILLARY     Status: Abnormal   Collection Time    03/20/14  7:28 AM      Result Value Ref Range   Glucose-Capillary 273 (*) 70 - 99 mg/dL   Comment 1 Notify RN       HEENT: normal Cardio: RRR and no murmur Resp: CTA B/L and unlabored GI: BS positive and ND Extremity:  Pulses positive and No Edema Skin:   Intact Neuro: Alert/Oriented, Cranial Nerve Abnormalities Left central 7, Abnormal Motor 3-/5 Left delt bi, tri, grip, HF, KE ADF, Abnormal FMC Ataxic/ dec FMC and Tone  Hypertonia, Tone:  left side increased flexor tone in UE and Dysarthric Musc/Skel:  Normal Gen NAD   Assessment/Plan: 1. Functional deficits secondary to acute /subacute anterior Right  frontal ischemic stroke which require 3+ hours per day of interdisciplinary therapy in a comprehensive inpatient rehab setting. Physiatrist is providing close team supervision and 24 hour management of active medical problems listed below. Physiatrist and rehab team continue to assess barriers to discharge/monitor patient progress toward functional and medical goals. INR therapeutic, if INR>2.5 tomorrow will D/C central ine FIM: FIM - Bathing Bathing Steps Patient Completed: Chest;Left upper leg;Right Arm;Right lower leg (including foot);Left lower leg (including foot);Left Arm;Abdomen;Front perineal area;Buttocks;Right upper leg Bathing: 5: Supervision: Safety issues/verbal cues  FIM - Upper Body Dressing/Undressing Upper body dressing/undressing steps patient completed: Thread/unthread right sleeve of pullover shirt/dresss;Thread/unthread left  sleeve of pullover shirt/dress;Put head through opening of pull over shirt/dress;Pull shirt over trunk Upper body dressing/undressing: 5: Supervision: Safety issues/verbal cues FIM - Lower Body Dressing/Undressing Lower body dressing/undressing steps patient completed: Thread/unthread right underwear leg;Thread/unthread left underwear leg;Pull underwear up/down;Don/Doff right sock;Don/Doff left sock;Pull pants up/down;Thread/unthread right pants leg;Thread/unthread left pants leg Lower body dressing/undressing: 5: Supervision: Safety issues/verbal cues  FIM - Toileting Toileting steps completed by patient: Adjust clothing prior to toileting;Performs perineal hygiene;Adjust clothing after toileting Toileting Assistive Devices: Grab bar or rail for support Toileting: 5: Supervision: Safety issues/verbal cues  FIM - Diplomatic Services operational officer Devices: Elevated toilet seat;Grab bars Toilet Transfers: 5-From toilet/BSC: Supervision (verbal cues/safety issues)  FIM - Banker Devices: Bed rails Bed/Chair Transfer: 0: Activity did not occur  FIM - Locomotion: Wheelchair Locomotion: Wheelchair: 0: Activity did not occur FIM - Locomotion: Ambulation Locomotion: Ambulation Assistive Devices: Designer, industrial/product Ambulation/Gait Assistance: 5: Supervision Locomotion: Ambulation: 5: Travels 150 ft or more with supervision/safety issues  Comprehension Comprehension Mode: Auditory Comprehension: 6-Follows complex conversation/direction: With extra time/assistive device  Expression Expression Mode: Verbal Expression: 6-Expresses complex ideas: With extra time/assistive device  Social Interaction Social Interaction: 6-Interacts appropriately with others with medication or extra time (anti-anxiety, antidepressant).  Problem Solving Problem Solving: 6-Solves complex problems: With extra time  Memory Memory: 7-Complete Independence: No  helper  Medical Problem List and Plan:  1. 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 DVT Prophylaxis/Anticoagulation: Heparin Coumadin therapy with history of aortic valve replacement. INR goal 2.5-3.5. Once goal is reached and maintained will d/c central line 3. Pain Management: Tylenol as needed.  4. Neuropsych: This patient is capable of making decisions on her own behalf.  5. Diabetes mellitus with peripheral neuropathy. Hemoglobin A1c pending. 70/30 insulin will titrate  Check blood sugars a.c. and at bedtime  6. CAD with CABG. Aspirin 81 mg daily. No chest pain or shortness of breath  7. Hyperlipidemia. Zocor  8. Insomnia.Rozerem    LOS (Days) 8 A FACE TO FACE EVALUATION WAS PERFORMED  Erick Colace 03/20/2014, 8:54 AM

## 2014-03-20 NOTE — Progress Notes (Signed)
Physical Therapy Session Note  Patient Details  Name: Madison [name] MRN: [id number] Date of Birth: [birth year]  Today's Date: 03/20/2014 Time: 1345-1430 Time Calculation (min): 45 min   Skilled Therapeutic Interventions/Progress Updates:  Pt received sitting in w/c upon PT entering room with  nsg present. Pt request to use the bathroom prior to beginning therapy. Pt ambulated into bathroom without AD. Pt independent with hygiene, pulling pants up/down. Pt request to take laundry to laundry room to wash clothes. Pt carried clothes in bad with use of LUE to laundry room, ambulating without AD at supervision level. Pt able to complete IADL task of putting clothes into washer, turning on washer and applying detergent. Pt ambulated in hallways without AD at supervision level, intermittent cues to increase knee flexion and stride length of LLE for foot clearance. Pt is aware and encourages herself to do so. Pt completed stair training, negotiating up/down 15 steps using a right rail at supervision level with min cues for sequencing. Pt negotiate up/down a curb step without an AD with min A and cues for sequencing. Pt completed balance activity, including negotiation of obstacle course, stepping over objects, weaving in fig 8 pattern and turning 360 degree turns without LOB x2 reps. Pt request to complete nu-step to promote increased strength and proprioception of LUE and LLE, performing  x7 min, level 4.  Pt ambulated to laundry room upon return to room to check on laundry. Laundry not completed washing yet. Pt returned to room, seated in w/c. All needs met and within reach.   Therapy Documentation Precautions:  Precautions Precautions: Fall Restrictions Weight Bearing Restrictions: No  Pain: Pain Assessment Pain Assessment: No/denies pain Pain Score: 0-No pain   See FIM for current functional status  Therapy/Group: Individual Therapy  Tuesday Terlecki R Kacey Dysert 03/20/2014, 3:16 PM

## 2014-03-21 ENCOUNTER — Inpatient Hospital Stay (HOSPITAL_COMMUNITY): Payer: Medicare HMO | Admitting: Occupational Therapy

## 2014-03-21 LAB — GLUCOSE, CAPILLARY
GLUCOSE-CAPILLARY: 337 mg/dL — AB (ref 70–99)
Glucose-Capillary: 203 mg/dL — ABNORMAL HIGH (ref 70–99)
Glucose-Capillary: 99 mg/dL (ref 70–99)
Glucose-Capillary: 75 mg/dL (ref 70–99)

## 2014-03-21 LAB — CBC
HCT: 31.3 % — ABNORMAL LOW (ref 36.0–46.0)
Hemoglobin: 10.3 g/dL — ABNORMAL LOW (ref 12.0–15.0)
MCH: 28.2 pg (ref 26.0–34.0)
MCHC: 32.9 g/dL (ref 30.0–36.0)
MCV: 85.8 fL (ref 78.0–100.0)
PLATELETS: 318 10*3/uL (ref 150–400)
RBC: 3.65 MIL/uL — ABNORMAL LOW (ref 3.87–5.11)
RDW: 14 % (ref 11.5–15.5)
WBC: 4.6 10*3/uL (ref 4.0–10.5)

## 2014-03-21 LAB — PROTIME-INR
INR: 3.1 — ABNORMAL HIGH (ref 0.00–1.49)
PROTHROMBIN TIME: 30.8 s — AB (ref 11.6–15.2)

## 2014-03-21 MED ORDER — WARFARIN SODIUM 6 MG PO TABS
6.0000 mg | ORAL_TABLET | Freq: Once | ORAL | Status: AC
  Administered 2014-03-21: 6 mg via ORAL
  Filled 2014-03-21: qty 1

## 2014-03-21 NOTE — Discharge Summary (Signed)
Discharge summary job # [id number]

## 2014-03-21 NOTE — Progress Notes (Signed)
ANTICOAGULATION CONSULT NOTE - Follow Up Consult  Pharmacy Consult for Coumadin Indication: AVR  Allergies  Allergen Reactions  . Celebrex [Celecoxib] Rash  . Detrol [Tolterodine]     Patient Measurements: Height: 5\' 3"  (160 cm) Weight: 147 lb 11.3 oz (67 kg) IBW/kg (Calculated) : 52.4   Vital Signs: Temp: 98.2 F (36.8 C) (05/03 0524) Temp src: Oral (05/03 0524) BP: 127/67 mmHg (05/03 0524) Pulse Rate: 64 (05/03 0524)  Labs:  Recent Labs  03/19/14 0535 03/20/14 0600 03/20/14 1450 03/21/14 0640  HGB 10.8* 7.5* 10.8* 10.3*  HCT 33.1* 28.7* 32.8* 31.3*  PLT 323 297 372 318  LABPROT 24.1* 26.7* 28.5* 30.8*  INR 2.24* 2.57* 2.80* 3.10*  HEPARINUNFRC 0.53 0.34  --   --     Estimated Creatinine Clearance: 74.7 ml/min (by C-G formula based on Cr of 0.72).  Assessment: 53yo female with AVR.53yo Heparin stopped 5/2.  INR remains therapeutic at 3.10.  Hg stable at 10.3.  Hg of 7.5 on 5/2 lab error.  No bleeding reported.  Goal of Therapy:  INR 2.5 - 3.5   Plan:  1. Coumadin 6 mg po x 1 dose today 2. Continue daily  INR  7/2, Pharm.D. Herby Abraham 03/21/2014 1:10 PM

## 2014-03-21 NOTE — Progress Notes (Signed)
Subjective/Complaints: 54 y.o. right-handed female with history of diabetes mellitus, hypertension, CAD with CABG as well as aortic valve replacement on chronic Coumadin. Patient was independent prior to admission with ADLs and mobility. Admitted 03/08/2014 with altered mental status, hypoglycemia and acute respiratory failure. Patient was intubated in the emergency room. Noted blood sugar of 1124 as well as potassium 7.2. 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 INR on admission of 1.37. Echocardiogram and carotid Dopplers are pending. Critical medicine consulted for severe DKA metabolic acidosis hyperkalemia. Placed on intravenous fluids. Patient was extubated 03/09/2014  No c/o Repeat CBC back at baseline INR in therapeutic range x 2 days Review of Systems - Negative except left side weak, prior stroke  Objective: Vital Signs: Blood pressure 127/67, pulse 64, temperature 98.2 F (36.8 C), temperature source Oral, resp. rate 19, height 5\' 3"  (1.6 m), weight 67 kg (147 lb 11.3 oz), SpO2 97.00%. No results found. Results for orders placed during the hospital encounter of 03/12/14 (from the past 72 hour(s))  GLUCOSE, CAPILLARY     Status: Abnormal   Collection Time    03/18/14 11:31 AM      Result Value Ref Range   Glucose-Capillary 100 (*) 70 - 99 mg/dL   Comment 1 Notify RN    GLUCOSE, CAPILLARY     Status: Abnormal   Collection Time    03/18/14  4:33 PM      Result Value Ref Range   Glucose-Capillary 193 (*) 70 - 99 mg/dL   Comment 1 Notify RN    GLUCOSE, CAPILLARY     Status: Abnormal   Collection Time    03/18/14  8:53 PM      Result Value Ref Range   Glucose-Capillary 138 (*) 70 - 99 mg/dL   Comment 1 Notify RN    PROTIME-INR     Status: Abnormal   Collection Time    03/19/14  5:35 AM      Result Value Ref Range   Prothrombin Time 24.1 (*) 11.6 - 15.2 seconds   INR 2.24 (*) 0.00 - 1.49   HEPARIN LEVEL (UNFRACTIONATED)     Status: None   Collection Time    03/19/14  5:35 AM      Result Value Ref Range   Heparin Unfractionated 0.53  0.30 - 0.70 IU/mL   Comment:            IF HEPARIN RESULTS ARE BELOW     EXPECTED VALUES, AND PATIENT     DOSAGE HAS BEEN CONFIRMED,     SUGGEST FOLLOW UP TESTING     OF ANTITHROMBIN III LEVELS.  CBC     Status: Abnormal   Collection Time    03/19/14  5:35 AM      Result Value Ref Range   WBC 5.4  4.0 - 10.5 K/uL   RBC 3.82 (*) 3.87 - 5.11 MIL/uL   Hemoglobin 10.8 (*) 12.0 - 15.0 g/dL   HCT 05/19/14 (*) 63.7 - 85.8 %   MCV 86.6  78.0 - 100.0 fL   MCH 28.3  26.0 - 34.0 pg   MCHC 32.6  30.0 - 36.0 g/dL   RDW 85.0  27.7 - 41.2 %   Platelets 323  150 - 400 K/uL  GLUCOSE, CAPILLARY     Status: Abnormal   Collection Time    03/19/14  7:47 AM      Result Value Ref  Range   Glucose-Capillary 262 (*) 70 - 99 mg/dL  GLUCOSE, CAPILLARY     Status: Abnormal   Collection Time    03/19/14 11:51 AM      Result Value Ref Range   Glucose-Capillary 137 (*) 70 - 99 mg/dL  GLUCOSE, CAPILLARY     Status: Abnormal   Collection Time    03/19/14  3:59 PM      Result Value Ref Range   Glucose-Capillary 232 (*) 70 - 99 mg/dL  GLUCOSE, CAPILLARY     Status: Abnormal   Collection Time    03/19/14  7:53 PM      Result Value Ref Range   Glucose-Capillary 108 (*) 70 - 99 mg/dL  PROTIME-INR     Status: Abnormal   Collection Time    03/20/14  6:00 AM      Result Value Ref Range   Prothrombin Time 26.7 (*) 11.6 - 15.2 seconds   INR 2.57 (*) 0.00 - 1.49  HEPARIN LEVEL (UNFRACTIONATED)     Status: None   Collection Time    03/20/14  6:00 AM      Result Value Ref Range   Heparin Unfractionated 0.34  0.30 - 0.70 IU/mL   Comment:            IF HEPARIN RESULTS ARE BELOW     EXPECTED VALUES, AND PATIENT     DOSAGE HAS BEEN CONFIRMED,     SUGGEST FOLLOW UP TESTING     OF ANTITHROMBIN III LEVELS.  CBC     Status: Abnormal   Collection Time    03/20/14   6:00 AM      Result Value Ref Range   WBC 5.5  4.0 - 10.5 K/uL   RBC 3.29 (*) 3.87 - 5.11 MIL/uL   Hemoglobin 7.5 (*) 12.0 - 15.0 g/dL   Comment: DELTA CHECK NOTED   HCT 28.7 (*) 36.0 - 46.0 %   MCV 87.2  78.0 - 100.0 fL   MCH 22.8 (*) 26.0 - 34.0 pg   MCHC 26.1 (*) 30.0 - 36.0 g/dL   RDW 40.9  81.1 - 91.4 %   Platelets 297  150 - 400 K/uL  GLUCOSE, CAPILLARY     Status: Abnormal   Collection Time    03/20/14  7:28 AM      Result Value Ref Range   Glucose-Capillary 273 (*) 70 - 99 mg/dL   Comment 1 Notify RN    GLUCOSE, CAPILLARY     Status: Abnormal   Collection Time    03/20/14 11:24 AM      Result Value Ref Range   Glucose-Capillary 150 (*) 70 - 99 mg/dL   Comment 1 Notify RN    CBC     Status: Abnormal   Collection Time    03/20/14  2:50 PM      Result Value Ref Range   WBC 6.5  4.0 - 10.5 K/uL   RBC 3.82 (*) 3.87 - 5.11 MIL/uL   Hemoglobin 10.8 (*) 12.0 - 15.0 g/dL   Comment: REPEATED TO VERIFY     DELTA CHECK NOTED   HCT 32.8 (*) 36.0 - 46.0 %   MCV 85.9  78.0 - 100.0 fL   MCH 28.3  26.0 - 34.0 pg   MCHC 32.9  30.0 - 36.0 g/dL   RDW 78.2  95.6 - 21.3 %   Platelets 372  150 - 400 K/uL  PROTIME-INR     Status: Abnormal   Collection  Time    03/20/14  2:50 PM      Result Value Ref Range   Prothrombin Time 28.5 (*) 11.6 - 15.2 seconds   INR 2.80 (*) 0.00 - 1.49  GLUCOSE, CAPILLARY     Status: Abnormal   Collection Time    03/20/14  4:50 PM      Result Value Ref Range   Glucose-Capillary 133 (*) 70 - 99 mg/dL   Comment 1 Notify RN    GLUCOSE, CAPILLARY     Status: Abnormal   Collection Time    03/20/14  8:00 PM      Result Value Ref Range   Glucose-Capillary 130 (*) 70 - 99 mg/dL  PROTIME-INR     Status: Abnormal   Collection Time    03/21/14  6:40 AM      Result Value Ref Range   Prothrombin Time 30.8 (*) 11.6 - 15.2 seconds   INR 3.10 (*) 0.00 - 1.49  CBC     Status: Abnormal   Collection Time    03/21/14  6:40 AM      Result Value Ref Range   WBC  4.6  4.0 - 10.5 K/uL   RBC 3.65 (*) 3.87 - 5.11 MIL/uL   Hemoglobin 10.3 (*) 12.0 - 15.0 g/dL   HCT 49.7 (*) 02.6 - 37.8 %   MCV 85.8  78.0 - 100.0 fL   MCH 28.2  26.0 - 34.0 pg   MCHC 32.9  30.0 - 36.0 g/dL   RDW 58.8  50.2 - 77.4 %   Platelets 318  150 - 400 K/uL  GLUCOSE, CAPILLARY     Status: Abnormal   Collection Time    03/21/14  7:07 AM      Result Value Ref Range   Glucose-Capillary 337 (*) 70 - 99 mg/dL   Comment 1 Notify RN       HEENT: normal Cardio: RRR and no murmur Resp: CTA B/L and unlabored GI: BS positive and ND Extremity:  Pulses positive and No Edema Skin:   Intact Neuro: Alert/Oriented, Cranial Nerve Abnormalities Left central 7, Abnormal Motor 3-/5 Left delt bi, tri, grip, HF, KE ADF, Abnormal FMC Ataxic/ dec FMC and Tone  Hypertonia, Tone:  left side increased flexor tone in UE and Dysarthric Musc/Skel:  Normal Gen NAD   Assessment/Plan: 1. Functional deficits secondary to acute /subacute anterior Right  frontal ischemic stroke which require 3+ hours per day of interdisciplinary therapy in a comprehensive inpatient rehab setting. Physiatrist is providing close team supervision and 24 hour management of active medical problems listed below. Physiatrist and rehab team continue to assess barriers to discharge/monitor patient progress toward functional and medical goals. INR therapeutic,  will D/C central ine, may shower after central ine out FIM: FIM - Bathing Bathing Steps Patient Completed: Chest;Right Arm;Left Arm;Buttocks;Front perineal area;Abdomen;Right upper leg;Left lower leg (including foot);Left upper leg;Right lower leg (including foot) Bathing: 6: More than reasonable amount of time  FIM - Upper Body Dressing/Undressing Upper body dressing/undressing steps patient completed: Thread/unthread right sleeve of pullover shirt/dresss;Thread/unthread left sleeve of pullover shirt/dress;Put head through opening of pull over shirt/dress Upper body  dressing/undressing: 5: Supervision: Safety issues/verbal cues (needs help from nursing to adjust the cervical IV line) FIM - Lower Body Dressing/Undressing Lower body dressing/undressing steps patient completed: Thread/unthread right underwear leg;Thread/unthread left underwear leg;Pull underwear up/down;Thread/unthread right pants leg;Thread/unthread left pants leg;Pull pants up/down;Fasten/unfasten pants;Don/Doff left sock;Don/Doff left shoe;Fasten/unfasten left shoe;Fasten/unfasten right shoe;Don/Doff right shoe;Don/Doff right sock Lower body dressing/undressing:  6: More than reasonable amount of time  FIM - Toileting Toileting steps completed by patient: Adjust clothing prior to toileting;Performs perineal hygiene;Adjust clothing after toileting Toileting Assistive Devices: Grab bar or rail for support Toileting: 0: Activity did not occur  FIM - Diplomatic Services operational officer Devices: Psychiatrist Transfers: 0-Activity did not occur  FIM - Banker Devices: Bed rails Bed/Chair Transfer: 5: Bed > Chair or W/C: Supervision (verbal cues/safety issues);5: Chair or W/C > Bed: Supervision (verbal cues/safety issues)  FIM - Locomotion: Wheelchair Locomotion: Wheelchair: 0: Activity did not occur FIM - Locomotion: Ambulation Locomotion: Ambulation Assistive Devices: Designer, industrial/product Ambulation/Gait Assistance: 5: Supervision Locomotion: Ambulation: 5: Travels 150 ft or more with supervision/safety issues  Comprehension Comprehension Mode: Auditory Comprehension: 6-Follows complex conversation/direction: With extra time/assistive device  Expression Expression Mode: Verbal Expression: 6-Expresses complex ideas: With extra time/assistive device  Social Interaction Social Interaction: 6-Interacts appropriately with others with medication or extra time (anti-anxiety, antidepressant).  Problem Solving Problem Solving: 6-Solves  complex problems: With extra time  Memory Memory: 7-Complete Independence: No helper  Medical Problem List and Plan:  1. 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 DVT Prophylaxis/Anticoagulation: Heparin Coumadin therapy with history of aortic valve replacement. INR goal 2.5-3.5. Once goal is reached and maintained will d/c central line 3. Pain Management: Tylenol as needed.  4. Neuropsych: This patient is capable of making decisions on her own behalf.  5. Diabetes mellitus with peripheral neuropathy. Hemoglobin A1c pending. 70/30 insulin will titrate  Check blood sugars a.c. and at bedtime  6. CAD with CABG. Aspirin 81 mg daily. No chest pain or shortness of breath  7. Hyperlipidemia. Zocor  8. Insomnia.Rozerem    LOS (Days) 9 A FACE TO FACE EVALUATION WAS PERFORMED  Erick Colace 03/21/2014, 9:17 AM

## 2014-03-21 NOTE — Progress Notes (Signed)
Occupational Therapy Session Note  Patient Details  Name: Aneka Fagerstrom MRN: [id number] Date of Birth: [birth year]  Today's Date: 03/21/2014 Time: 9450-3888 Time Calculation (min): 45 min  Skilled Therapeutic Interventions/Progress Updates:    Patient demonstrated overall modified independent level for self care in room for bathing and dressing sink level today.   She also completed toilet transfers and toileting and walk in shower transfer with extra time and modified independence.  She stated that she will go home tomorrow.   Therapy Documentation Precautions:  Precautions Precautions: Fall Restrictions Weight Bearing Restrictions: No Pain: denied     See FIM for current functional status  Therapy/Group: Individual Therapy  Rozelle Logan 03/21/2014, 2:31 PM

## 2014-03-22 DIAGNOSIS — I6350 Cerebral infarction due to unspecified occlusion or stenosis of unspecified cerebral artery: Secondary | ICD-10-CM

## 2014-03-22 DIAGNOSIS — G8110 Spastic hemiplegia affecting unspecified side: Secondary | ICD-10-CM

## 2014-03-22 LAB — GLUCOSE, CAPILLARY: Glucose-Capillary: 196 mg/dL — ABNORMAL HIGH (ref 70–99)

## 2014-03-22 LAB — PROTIME-INR
INR: 2.58 — AB (ref 0.00–1.49)
Prothrombin Time: 26.8 seconds — ABNORMAL HIGH (ref 11.6–15.2)

## 2014-03-22 MED ORDER — ASPIRIN 81 MG PO TABS: 81.0000 mg | ORAL_TABLET | Freq: Every day | ORAL | Status: DC

## 2014-03-22 MED ORDER — PRAVASTATIN SODIUM 20 MG PO TABS: 20.0000 mg | ORAL_TABLET | Freq: Every day | ORAL | Status: DC

## 2014-03-22 MED ORDER — WARFARIN SODIUM 7.5 MG PO TABS: 7.5000 mg | ORAL_TABLET | Freq: Once | ORAL | Status: DC

## 2014-03-22 MED ORDER — BACLOFEN 10 MG PO TABS: 10.0000 mg | ORAL_TABLET | Freq: Two times a day (BID) | ORAL | Status: DC

## 2014-03-22 MED ORDER — WARFARIN SODIUM 7.5 MG PO TABS: 7.5000 mg | ORAL_TABLET | Freq: Every day | ORAL | Status: DC

## 2014-03-22 MED ORDER — OMEPRAZOLE 40 MG PO CPDR: 40.0000 mg | DELAYED_RELEASE_CAPSULE | Freq: Every day | ORAL | Status: DC

## 2014-03-22 MED ORDER — WARFARIN SODIUM 7.5 MG PO TABS
7.5000 mg | ORAL_TABLET | Freq: Every day | ORAL | Status: DC
  Filled 2014-03-22: qty 1

## 2014-03-22 MED ORDER — ESCITALOPRAM OXALATE 5 MG PO TABS: 5.0000 mg | ORAL_TABLET | Freq: Every day | ORAL | Status: DC

## 2014-03-22 MED ORDER — WARFARIN SODIUM 7.5 MG PO TABS
7.5000 mg | ORAL_TABLET | Freq: Once | ORAL | Status: DC
  Filled 2014-03-22: qty 1

## 2014-03-22 MED ORDER — INSULIN ASPART PROT & ASPART (70-30 MIX) 100 UNIT/ML ~~LOC~~ SUSP
20.0000 [IU] | Freq: Two times a day (BID) | SUBCUTANEOUS | Status: DC
Start: 2014-03-22 — End: 2014-04-28

## 2014-03-22 NOTE — Progress Notes (Signed)
Occupational Therapy Discharge Summary  Patient Details  Name: Madison [name] MRN: [id number] Date of Birth: [birth year]  Today's Date: 03/22/2014  Patient has met 85 of 13 long term goals due to improved balance, postural control, ability to compensate for deficits, functional use of  LEFT upper and LEFT lower extremity, improved awareness and improved coordination.  Patient to discharge at overall Modified Independent level.  Patient's care partner is independent to provide the necessary physical assistance at discharge.    Reasons goals not met: NA  Recommendation:  Patient will benefit from ongoing skilled OT services in home health setting to continue to advance functional skills in the area of BADL.  Pt has made great progress with OT at this time but still needs supervision/min assistance for homemanagement tasks.  She still demonstrates decreased LUE and LLE coordination and strength overall and will benefit from continued OT via home health as well to increase functional use of the RUE and greater independence with IADLs.   Equipment: No equipment provided  Reasons for discharge: treatment goals met and discharge from hospital  Patient/family agrees with progress made and goals achieved: Yes  OT Discharge Precautions/Restrictions  Precautions Precautions: Fall Restrictions Weight Bearing Restrictions: No  Pain Pain Assessment Pain Assessment: No/denies pain ADL  See FIM scale for details  Vision/Perception  Vision- History Baseline Vision/History: Wears glasses Wears Glasses: At all times Vision- Assessment Vision Assessment?: Yes Eye Alignment: Within Functional Limits Ocular Range of Motion: Within Functional Limits Tracking/Visual Pursuits: Able to track stimulus in all quads without difficulty Saccades: Decreased speed of saccadic movement Convergence: Within functional limits Visual Fields: Left visual field deficit;Right visual field deficit Additional Comments:  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  Cognition Overall Cognitive Status: Within Functional Limits for tasks assessed Orientation Level: Oriented X4 Attention: Focused;Selective;Sustained Focused Attention: Appears intact Sustained Attention: Appears intact Selective Attention: Appears intact Memory: Impaired Memory Impairment: Decreased recall of new information;Decreased short term memory Awareness: Impaired Awareness Impairment: Anticipatory impairment Problem Solving: Appears intact Safety/Judgment: Appears intact Comments: Pt with decreased memory of RW use at times.  Needs cueing to not push it to the side and stay inside of it.  Sensation Sensation Light Touch: Appears Intact Stereognosis: Not tested Hot/Cold: Not tested Proprioception: Appears Intact Proprioception Impaired Details: Impaired LUE Additional Comments: Pt with decreased proprioception in the left hand compared to the right. Coordination Gross Motor Movements are Fluid and Coordinated: No Fine Motor Movements are Fluid and Coordinated: No Coordination and Movement Description: Pt continues to demonstrate decreased fine motor skills in LUE from previous CVA. Motor  Motor Motor: Abnormal postural alignment and control;Hemiplegia Motor - Discharge Observations: Pt with mild right hemiparesis in the UE and LE. Mobility  Bed Mobility Bed Mobility: Supine to Sit;Sit to Supine Supine to Sit: 7: Independent Sitting - Scoot to Edge of Bed: 7: Independent Sit to Supine: 7: Independent Transfers Transfers: Sit to Stand Sit to Stand: 6: Modified independent (Device/Increase time) Stand to Sit: 6: Modified independent (Device/Increase time)  Trunk/Postural Assessment  Cervical Assessment Cervical Assessment: Within Functional Limits Thoracic Assessment Thoracic Assessment: Within Functional Limits Lumbar Assessment Lumbar Assessment: Within Functional Limits Postural Control Postural  Limitations: Pt slightly rotated backwards on the left side of her trunk.    Balance Balance Balance Assessed: Yes Dynamic Sitting Balance Dynamic Sitting - Balance Support: No upper extremity supported;Feet supported Dynamic Sitting - Level of Assistance: 6: Modified independent (Device/Increase time) Static Standing Balance Static Standing - Balance Support: No  upper extremity supported Dynamic Standing Balance Dynamic Standing - Balance Support: No upper extremity supported Dynamic Standing - Level of Assistance: 6: Modified independent (Device/Increase time) Extremity/Trunk Assessment RUE Assessment RUE Assessment: Exceptions to [facility] RUE Strength RUE Overall Strength Comments: Pt is currently at a Brunnstrum stage V movement in the RUE and hand.  Slight increased flexor and extensor tone noted with elbow flexion and extension as well as with digit extension.  She uses functionallyn as an active assist for bathing and dressing but demonstrates decreased isolation of movements as well as decreased FM control.  she is able to oppose the thumb to all digits except for the 5th. LUE Assessment LUE Assessment: Within Functional Limits  See FIM for current functional status  Cindra Presume OTR/L 03/22/2014, 12:34 PM

## 2014-03-22 NOTE — Discharge Instructions (Signed)
Inpatient Rehab Discharge Instructions  Madison [name] Discharge date and time: No discharge date for patient encounter.   Activities/Precautions/ Functional Status: Activity: activity as tolerated Diet: diabetic diet Wound Care: none needed Functional status:  ___ No restrictions     ___ Walk up steps independently _x__ 24/7 supervision/assistance   ___ Walk up steps with assistance ___ Intermittent supervision/assistance  ___ Bathe/dress independently ___ Walk with walker     ___ Bathe/dress with assistance ___ Walk Independently    ___ Shower independently _x STROKE/TIA DISCHARGE INSTRUCTIONS SMOKING Cigarette smoking nearly doubles your risk of having a stroke & is the single most alterable risk factor  If you smoke or have smoked in the last 12 months, you are advised to quit smoking for your health.  Most of the excess cardiovascular risk related to smoking disappears within a year of stopping.  Ask you doctor about anti-smoking medications  [place] Quit Line: 1-800-QUIT NOW  Free Smoking Cessation Classes [phone]  CHOLESTEROL Know your levels; limit fat & cholesterol in your diet  Lipid Panel     Component Value Date/Time   CHOL 111 03/10/2014 0350   TRIG 90 03/10/2014 0350   HDL 43 03/10/2014 0350   CHOLHDL 2.6 03/10/2014 0350   VLDL 18 03/10/2014 0350   LDLCALC 50 03/10/2014 0350      Many patients benefit from treatment even if their cholesterol is at goal.  Goal: Total Cholesterol (CHOL) less than 160  Goal:  Triglycerides (TRIG) less than 150  Goal:  HDL greater than 40  Goal:  LDL (LDLCALC) less than 100   BLOOD PRESSURE American Stroke Association blood pressure target is less that 120/80 mm/Hg  Your discharge blood pressure is:  BP: 115/70 mmHg  Monitor your blood pressure  Limit your salt and alcohol intake  Many individuals will require more than one medication for high blood pressure  DIABETES (A1c is a blood sugar average for last 3 months) Goal  HGBA1c is under 7% (HBGA1c is blood sugar average for last 3 months)  Diabetes:    Lab Results  Component Value Date   HGBA1C 11.7* 03/11/2014     Your HGBA1c can be lowered with medications, healthy diet, and exercise.  Check your blood sugar as directed by your physician  Call your physician if you experience unexplained or low blood sugars.  PHYSICAL ACTIVITY/REHABILITATION Goal is 30 minutes at least 4 days per week  Activity: Increase activity slowly, Therapies: Physical Therapy: Home Health Return to work:   Activity decreases your risk of heart attack and stroke and makes your heart stronger.  It helps control your weight and blood pressure; helps you relax and can improve your mood.  Participate in a regular exercise program.  Talk with your doctor about the best form of exercise for you (dancing, walking, swimming, cycling).  DIET/WEIGHT Goal is to maintain a healthy weight  Your discharge diet is: Carb Control  liquids Your height is:  Height: 5\' 3"  (160 cm) Your current weight is: Weight: 68.2 kg (150 lb 5.7 oz) Your Body Mass Index (BMI) is:  BMI (Calculated): 28.3  Following the type of diet specifically designed for you will help prevent another stroke.  Your goal weight range is:    Your goal Body Mass Index (BMI) is 19-24.  Healthy food habits can help reduce 3 risk factors for stroke:  High cholesterol, hypertension, and excess weight.  RESOURCES Stroke/Support Group:  Call [phone]   STROKE EDUCATION PROVIDED/REVIEWED AND GIVEN TO  PATIENT Stroke warning signs and symptoms How to activate emergency medical system (call 911). Medications prescribed at discharge. Need for follow-up after discharge. Personal risk factors for stroke. Pneumonia vaccine given:  Flu vaccine given:  My questions have been answered, the writing is legible, and I understand these instructions.  I will adhere to these goals & educational materials that have been provided to me  after my discharge from the hospital.    __ Walk with assistance    ___ Shower with assistance ___ No alcohol     ___ Return to work/school ________  Special Instructions: Home health nurse to check INR on Tuesday, 03/23/2014 results to [facility] Coumad[facility] clinic [phone] fax number [phone]  COMMUNITY REFERRALS UPON DISCHARGE:    Home Health:   PT, OT, RN   Agency:[facility] Phone:[phone] Date of last service:03/22/2014   Medical Equipment/Items Ordered:ROLLING [facility]  Agency/Supplier:[facility]    [phone]   GENERAL COMMUNITY RESOURCES FOR PATIENT/FAMILY: Support Groups:[facility] SUPPORT GROUP  My questions have been answered and I understand these instructions. I will adhere to these goals and the provided educational materials after my discharge from the hospital.  Patient/Caregiver Signature _______________________________ Date __________  Clinician Signature _______________________________________ Date __________  Please bring this form and your medication list with you to all your follow-up doctor's appointments.

## 2014-03-22 NOTE — Progress Notes (Signed)
Subjective/Complaints: 53 y.o. right-handed female with history of diabetes mellitus, hypertension, CAD with CABG as well as aortic valve replacement on chronic Coumadin. Patient was independent prior to admission with ADLs and mobility. Admitted 03/08/2014 with altered mental status, hypoglycemia and acute respiratory failure. Patient was intubated in the emergency room. Noted blood sugar of 1124 as well as potassium 7.2. 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 INR on admission of 1.37. Echocardiogram and carotid Dopplers are pending. Critical medicine consulted for severe DKA metabolic acidosis hyperkalemia. Placed on intravenous fluids. Patient was extubated 03/09/2014  No c/o Repeat CBC back at baseline INR in therapeutic range x 2 days Review of Systems - Negative except left side weak, prior stroke  Objective: Vital Signs: Blood pressure 100/66, pulse 75, temperature 98 F (36.7 C), temperature source Oral, resp. rate 18, height 5\' 3"  (1.6 m), weight 67 kg (147 lb 11.3 oz), SpO2 99.00%. No results found. Results for orders placed during the hospital encounter of 03/12/14 (from the past 72 hour(s))  GLUCOSE, CAPILLARY     Status: Abnormal   Collection Time    03/19/14 11:51 AM      Result Value Ref Range   Glucose-Capillary 137 (*) 70 - 99 mg/dL  GLUCOSE, CAPILLARY     Status: Abnormal   Collection Time    03/19/14  3:59 PM      Result Value Ref Range   Glucose-Capillary 232 (*) 70 - 99 mg/dL  GLUCOSE, CAPILLARY     Status: Abnormal   Collection Time    03/19/14  7:53 PM      Result Value Ref Range   Glucose-Capillary 108 (*) 70 - 99 mg/dL  PROTIME-INR     Status: Abnormal   Collection Time    03/20/14  6:00 AM      Result Value Ref Range   Prothrombin Time 26.7 (*) 11.6 - 15.2 seconds   INR 2.57 (*) 0.00 - 1.49  HEPARIN LEVEL (UNFRACTIONATED)     Status: None   Collection Time    03/20/14   6:00 AM      Result Value Ref Range   Heparin Unfractionated 0.34  0.30 - 0.70 IU/mL   Comment:            IF HEPARIN RESULTS ARE BELOW     EXPECTED VALUES, AND PATIENT     DOSAGE HAS BEEN CONFIRMED,     SUGGEST FOLLOW UP TESTING     OF ANTITHROMBIN III LEVELS.  CBC     Status: Abnormal   Collection Time    03/20/14  6:00 AM      Result Value Ref Range   WBC 5.5  4.0 - 10.5 K/uL   RBC 3.29 (*) 3.87 - 5.11 MIL/uL   Hemoglobin 7.5 (*) 12.0 - 15.0 g/dL   Comment: DELTA CHECK NOTED   HCT 28.7 (*) 36.0 - 46.0 %   MCV 87.2  78.0 - 100.0 fL   MCH 22.8 (*) 26.0 - 34.0 pg   MCHC 26.1 (*) 30.0 - 36.0 g/dL   RDW 32.5  49.8 - 26.4 %   Platelets 297  150 - 400 K/uL  GLUCOSE, CAPILLARY     Status: Abnormal   Collection Time    03/20/14  7:28 AM      Result Value Ref Range   Glucose-Capillary 273 (*) 70 - 99 mg/dL   Comment 1 Notify RN  GLUCOSE, CAPILLARY     Status: Abnormal   Collection Time    03/20/14 11:24 AM      Result Value Ref Range   Glucose-Capillary 150 (*) 70 - 99 mg/dL   Comment 1 Notify RN    CBC     Status: Abnormal   Collection Time    03/20/14  2:50 PM      Result Value Ref Range   WBC 6.5  4.0 - 10.5 K/uL   RBC 3.82 (*) 3.87 - 5.11 MIL/uL   Hemoglobin 10.8 (*) 12.0 - 15.0 g/dL   Comment: REPEATED TO VERIFY     DELTA CHECK NOTED   HCT 32.8 (*) 36.0 - 46.0 %   MCV 85.9  78.0 - 100.0 fL   MCH 28.3  26.0 - 34.0 pg   MCHC 32.9  30.0 - 36.0 g/dL   RDW 35.0  09.3 - 81.8 %   Platelets 372  150 - 400 K/uL  PROTIME-INR     Status: Abnormal   Collection Time    03/20/14  2:50 PM      Result Value Ref Range   Prothrombin Time 28.5 (*) 11.6 - 15.2 seconds   INR 2.80 (*) 0.00 - 1.49  GLUCOSE, CAPILLARY     Status: Abnormal   Collection Time    03/20/14  4:50 PM      Result Value Ref Range   Glucose-Capillary 133 (*) 70 - 99 mg/dL   Comment 1 Notify RN    GLUCOSE, CAPILLARY     Status: Abnormal   Collection Time    03/20/14  8:00 PM      Result Value Ref Range    Glucose-Capillary 130 (*) 70 - 99 mg/dL  PROTIME-INR     Status: Abnormal   Collection Time    03/21/14  6:40 AM      Result Value Ref Range   Prothrombin Time 30.8 (*) 11.6 - 15.2 seconds   INR 3.10 (*) 0.00 - 1.49  CBC     Status: Abnormal   Collection Time    03/21/14  6:40 AM      Result Value Ref Range   WBC 4.6  4.0 - 10.5 K/uL   RBC 3.65 (*) 3.87 - 5.11 MIL/uL   Hemoglobin 10.3 (*) 12.0 - 15.0 g/dL   HCT 29.9 (*) 37.1 - 69.6 %   MCV 85.8  78.0 - 100.0 fL   MCH 28.2  26.0 - 34.0 pg   MCHC 32.9  30.0 - 36.0 g/dL   RDW 78.9  38.1 - 01.7 %   Platelets 318  150 - 400 K/uL  GLUCOSE, CAPILLARY     Status: Abnormal   Collection Time    03/21/14  7:07 AM      Result Value Ref Range   Glucose-Capillary 337 (*) 70 - 99 mg/dL   Comment 1 Notify RN    GLUCOSE, CAPILLARY     Status: Abnormal   Collection Time    03/21/14 11:25 AM      Result Value Ref Range   Glucose-Capillary 203 (*) 70 - 99 mg/dL   Comment 1 Notify RN    GLUCOSE, CAPILLARY     Status: None   Collection Time    03/21/14  4:43 PM      Result Value Ref Range   Glucose-Capillary 99  70 - 99 mg/dL   Comment 1 Documented in Chart    GLUCOSE, CAPILLARY     Status: None  Collection Time    03/21/14  8:04 PM      Result Value Ref Range   Glucose-Capillary 75  70 - 99 mg/dL  PROTIME-INR     Status: Abnormal   Collection Time    03/22/14  7:08 AM      Result Value Ref Range   Prothrombin Time 26.8 (*) 11.6 - 15.2 seconds   INR 2.58 (*) 0.00 - 1.49  GLUCOSE, CAPILLARY     Status: Abnormal   Collection Time    03/22/14  7:51 AM      Result Value Ref Range   Glucose-Capillary 196 (*) 70 - 99 mg/dL     HEENT: normal Cardio: RRR and no murmur Resp: CTA B/L and unlabored GI: BS positive and ND Extremity:  Pulses positive and No Edema Skin:   Intact Neuro: Alert/Oriented, Cranial Nerve Abnormalities Left central 7, Abnormal Motor 3-/5 Left delt bi, tri, grip, HF, KE ADF, Abnormal FMC Ataxic/ dec FMC and  Tone  Hypertonia, Tone:  left side increased flexor tone in UE and Dysarthric Musc/Skel:  Normal Gen NAD   Assessment/Plan: 1. Functional deficits secondary to acute /subacute anterior Right  frontal ischemic stroke  Stable for D/C today F/u PCP in 1-2 weeks F/u PM&R 3 weeks See D/C summary See D/C instructions FIM: FIM - Bathing Bathing Steps Patient Completed: Chest;Right upper leg;Right Arm;Left upper leg;Left Arm;Right lower leg (including foot);Abdomen;Left lower leg (including foot);Front perineal area;Buttocks Bathing: 6: More than reasonable amount of time  FIM - Upper Body Dressing/Undressing Upper body dressing/undressing steps patient completed: Thread/unthread right sleeve of pullover shirt/dresss;Put head through opening of pull over shirt/dress;Thread/unthread left sleeve of pullover shirt/dress;Pull shirt over trunk Upper body dressing/undressing: 6: More than reasonable amount of time FIM - Lower Body Dressing/Undressing Lower body dressing/undressing steps patient completed: Thread/unthread right underwear leg;Thread/unthread left underwear leg;Pull underwear up/down;Thread/unthread left pants leg;Don/Doff left sock;Fasten/unfasten left shoe;Pull pants up/down;Don/Doff right shoe;Don/Doff left shoe;Fasten/unfasten pants;Thread/unthread right pants leg;Don/Doff right sock;Fasten/unfasten right shoe Lower body dressing/undressing: 6: More than reasonable amount of time  FIM - Toileting Toileting steps completed by patient: Adjust clothing prior to toileting;Performs perineal hygiene;Adjust clothing after toileting Toileting Assistive Devices: Grab bar or rail for support Toileting: 6: More than reasonable amount of time  FIM - Diplomatic Services operational officer Devices: Grab bars Toilet Transfers: 6-More than reasonable amt of time  FIM - Banker Devices: Bed rails Bed/Chair Transfer: 6: More than reasonable amt of  time  FIM - Locomotion: Wheelchair Locomotion: Wheelchair: 0: Activity did not occur FIM - Locomotion: Ambulation Locomotion: Ambulation Assistive Devices: Designer, industrial/product Ambulation/Gait Assistance: 6: Modified independent (Device/Increase time) Locomotion: Ambulation: 5: Travels 150 ft or more with supervision/safety issues  Comprehension Comprehension Mode: Auditory Comprehension: 6-Follows complex conversation/direction: With extra time/assistive device  Expression Expression Mode: Verbal Expression: 6-Expresses complex ideas: With extra time/assistive device  Social Interaction Social Interaction: 6-Interacts appropriately with others with medication or extra time (anti-anxiety, antidepressant).  Problem Solving Problem Solving: 6-Solves complex problems: With extra time  Memory Memory: 7-Complete Independence: No helper  Medical Problem List and Plan:  1. 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 DVT Prophylaxis/Anticoagulation: Heparin Coumadin therapy with history of aortic valve replacement. INR goal 2.5-3.5. Once goal is reached and maintained will d/c central line 3. Pain Management: Tylenol as needed.  4. Neuropsych: This patient is capable of making decisions on her own behalf.  5. Diabetes mellitus with peripheral neuropathy. Hemoglobin A1c pending. 70/30 insulin will titrate  Check blood sugars a.c. and at bedtime  6. CAD with CABG. Aspirin 81 mg daily. No chest pain or shortness of breath  7. Hyperlipidemia. Zocor  8. Insomnia.Rozerem    LOS (Days) 10 A FACE TO FACE EVALUATION WAS PERFORMED  Erick Colace 03/22/2014, 8:25 AM

## 2014-03-22 NOTE — Progress Notes (Signed)
Social Work Discharge Note Discharge Note  The overall goal for the admission was met for:   Discharge location: Yes-HOME WITH CAP WORKER AND SISTER CHECKING IN ON  Length of Stay: YES-7 DAYS  Discharge activity level: Yes-MOD/I LEVEL  Home/community participation: Yes  Services provided included: MD, RD, PT, OT, SLP, RN, TR, Pharmacy and SW  Financial Services: Medicaid and Private Insurance: [place]  Follow-up services arranged: Home Health: [facility]-RN, OT, PT, DME: [facility]-ROLLING WALKER and Patient/Family has no preference for HH/DME agencies  Comments (or additional information):PT MEDICALLY STABLE-INR AT THERAPEUTIC LEVEL  Patient/Family verbalized understanding of follow-up arrangements: Yes  Individual responsible for coordination of the follow-up plan: SELF & TRACY-SISTER  Confirmed correct DME delivered: Elease Hashimoto 03/22/2014    [employer]

## 2014-03-22 NOTE — Progress Notes (Signed)
Patient and caregiver received discharge instructions from Dan Angiulli, PA-C with verbal understanding. Patient discharged to home with caregiver and belongings. 

## 2014-03-22 NOTE — Discharge Summary (Signed)
NAMESHIZUKO, WOJDYLA                ACCOUNT NO.:  [id number]  MEDICAL RECORD NO.:  [id number]  LOCATION:  4W05C                        FACILITY:  [facility]  PHYSICIAN:  Erick Colace, M.D.DATE OF BIRTH:  02/24/1961  DATE OF ADMISSION:  03/12/2014 DATE OF DISCHARGE:  03/22/2014                              DISCHARGE SUMMARY   DISCHARGE DIAGNOSES: 1. 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 2. Heparin, Coumadin therapy for history of aortic valve replacement. 3. Pain management. 4. Diabetes mellitus with peripheral neuropathy. 5. 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 6. Hyperlipidemia. 7. Insomnia.  This is a 53 year old right-handed female with history of CAD with CABG as well as aortic valve replacement on chronic Coumadin.  The patient is independent prior to admission with activities of daily living and mobility.  Admitted on March 08, 2014, with altered mental status, hypoglycemia, and acute respiratory failure.  The patient was intubated in the emergency room.  Noted blood sugar of 1124 as well as potassium 7.2.  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  INR on admission of 1.37.  Echocardiogram, carotid Dopplers were pending.  Critical Care Medicine consulted for severe DKA, metabolic acidosis, hyperkalemia, placed on intravenous fluid.  She was extubated on March 09, 2014. Neurology Service was consulted.  The patient remained on Coumadin therapy with heparin bridge for secondary stroke prevention.  Mechanical soft diet.  Physical and occupational therapy ongoing.  The patient was admitted for comprehensive rehab program.  PAST MEDICAL HISTORY:  See discharge diagnoses.  SOCIAL HISTORY:  Lives with family.  FUNCTIONAL HISTORY:  Prior to admission independent.  Functional status upon admission to rehab services minimal  assist to ambulate as well as min, mod assist activities of daily living.  PHYSICAL EXAMINATION:  VITAL SIGNS:  Blood pressure 109/48, pulse 93, temperature 98.5, respirations 22. GENERAL:  This was an alert female.  She made good eye contact with examiner.  She was oriented to name, date of birth, and age.  Fair insight and awareness of her deficits. LUNGS:  Clear to auscultation. CARDIAC:  Rate controlled. ABDOMEN:  Soft, nontender.  Good bowel sounds.  REHABILITATION HOSPITAL COURSE:  The patient was admitted to inpatient rehab services with therapies initiated on a 3-hour daily basis consisting of physical therapy, occupational therapy, speech therapy, and rehabilitation nursing the following issues were addressed during the patient's rehabilitation stay.  Pertaining to Ms. Belen, new right frontal infarct felt to be embolic superimposed on previous right MCA infarct remained stable.  She would follow up with Neurology Services. She remained on chronic Coumadin therapy for history of aortic valve replacement.  No bleeding episodes.  Latest INR of 3.10.  Arrangements were made to follow up with her primary MD.  She did have a history of diabetes mellitus with peripheral neuropathy.  She remained on insulin therapy as directed.  She received full diabetic teaching.  She did have a history of coronary artery disease with CABG, maintained on aspirin therapy.  No chest pain or shortness of breath.  She continued on  Zocor for history of hyperlipidemia.  Lexapro ongoing for history of depression with emotional support provided.  She was participating fully with her therapies.  The patient received weekly collaborative interdisciplinary team conferences to discuss estimated length of stay, family teaching, and any barriers to her discharge.  She ambulates to the bathroom without assistive device, independent with her hygiene. She did take her laundry to wash her clothes.  She ambulated  without assistive device at supervision level.  Occupational therapy, she is able to dressing, groom at sing side with mere supervision.  She made excellent gains throughout her rehab stay.  Full family teaching was completed with recommendations of ongoing therapies at the time of discharge.  DISCHARGE MEDICATIONS: 1. Aspirin 81 mg p.o. daily. 2. Baclofen 10 mg p.o. b.i.d. 3. Enablex 7.5 mg p.o. daily. 4. Lexapro 5 mg p.o. daily. 5. NovoLog 70/30 20 units b.i.d. 6. Protonix 40 mg p.o. daily. 7. Rozerem 8 mg p.o. at bedtime for insomnia. 8. Zocor 10 mg p.o. daily. 9. Coumadin latest dose of 7.5 mg adjusted accordingly for INR of 2.0-     3.0.  INR goal 2.5-3.5.  SPECIAL INSTRUCTIONS:  Home health nurse arranged to check INR with results to [facility] [phone] fax number [phone] to check INR on 03/25/2014 .  She should follow up with Dr. Claudette Laws at the outpatient rehab service office on April 27, 2014; Dr. Pearlean Brownie, Neurology Services in 1 month call for appointment.     Mariam Dollar, P.A.   ______________________________ Erick Colace, M.D.    DA/MEDQ  D:  03/21/2014  T:  03/22/2014  Job:  [id number]  cc:   Erick Colace, M.D. Padonda FNP Orvan Falconer Pramod P. Pearlean Brownie, MD

## 2014-03-22 NOTE — Progress Notes (Signed)
Physical Therapy Discharge Summary  Patient Details  Name: Madison [name] MRN: [id number] Date of Birth: [birth year]  Today's Date: 03/22/2014    Patient has met 7 of 8 long term goals due to improved activity tolerance, improved balance, improved postural control, ability to compensate for deficits, improved awareness and improved coordination.  Patient to discharge at an ambulatory level Modified Independent.   Did not perform formal family training due to pt being at Mod I level upon D/C.    Reasons goals not met: Pt unable to complete stairs at Mod I level and continues to require supervision with use of handrail.   Recommendation:  Patient will benefit from ongoing skilled PT services in home health setting to continue to advance safe functional mobility, address ongoing impairments in decreased balance, decreased strength in LLE, decreased functional use of LUE, and minimize fall risk.  Equipment: RW  Reasons for discharge: treatment goals met and discharge from hospital  Patient/family agrees with progress made and goals achieved: Yes  PT Discharge Precautions/Restrictions Precautions Precautions: Fall Restrictions Weight Bearing Restrictions: No Vital Signs Therapy Vitals Temp: 98 F (36.7 C) Temp src: Oral Pulse Rate: 75 Resp: 18 BP: 100/66 mmHg Patient Position, if appropriate: Lying Oxygen Therapy SpO2: 99 % O2 Device: None (Room air)    Cognition Overall Cognitive Status: Within Functional Limits for tasks assessed Arousal/Alertness: Awake/alert Orientation Level: Oriented X4 Attention: Focused;Selective;Sustained Focused Attention: Appears intact Sustained Attention: Appears intact Selective Attention: Appears intact Memory: Impaired Memory Impairment: Decreased recall of new information Awareness: Impaired Awareness Impairment: Anticipatory impairment Safety/Judgment: Appears intact Comments: Pt stating she does not notice any deficits from this CVA that  weren't present from previous CVA.   Sensation Sensation Light Touch: Appears Intact Stereognosis: Not tested Hot/Cold: Not tested Proprioception: Appears Intact Coordination Gross Motor Movements are Fluid and Coordinated: Yes Fine Motor Movements are Fluid and Coordinated: No Coordination and Movement Description: pt continues to demonstrate decreased fine motor skills in LUE from previous CVA. Heel Shin Test: impaired L heel on R Motor  Motor Motor: Abnormal postural alignment and control;Hemiplegia Motor - Discharge Observations: Pt with mild right hemiparesis in the UE and LE.  Mobility Bed Mobility Bed Mobility: Supine to Sit;Sit to Supine Supine to Sit: 7: Independent Sit to Supine: 7: Independent Transfers Transfers: Yes Sit to Stand: 6: Modified independent (Device/Increase time) Stand to Sit: 6: Modified independent (Device/Increase time) Stand Pivot Transfers: 6: Modified independent (Device/Increase time) Locomotion  Ambulation Ambulation: Yes Ambulation/Gait Assistance: 6: Modified independent (Device/Increase time) Ambulation Distance (Feet): 200 Feet (per previous sessions. ) Assistive device: Rolling walker Gait Gait: Yes Gait Pattern: Impaired Gait Pattern: Lateral hip instability;Trunk flexed;Left hip hike;Decreased hip/knee flexion - left Stairs / Additional Locomotion Stairs: Yes Stairs Assistance: 5: Supervision Stairs Assistance Details: Verbal cues for sequencing;Verbal cues for technique Stair Management Technique: One rail Right;Step to pattern;Forwards (per previous sessions) Number of Stairs: 15 Wheelchair Mobility Wheelchair Mobility: No  Trunk/Postural Assessment  Cervical Assessment Cervical Assessment: Within Functional Limits Thoracic Assessment Thoracic Assessment: Within Functional Limits Lumbar Assessment Lumbar Assessment: Within Functional Limits Postural Control Postural Control: Deficits on evaluation Postural Limitations: Pt  with slight increased trunk elongation in the left side.  Balance Balance Balance Assessed: Yes Dynamic Sitting Balance Dynamic Sitting - Balance Support: No upper extremity supported;Feet supported Dynamic Sitting - Level of Assistance: 6: Modified independent (Device/Increase time) Static Standing Balance Static Standing - Balance Support: No upper extremity supported Static Standing - Level of Assistance: 6: Modified independent (Device/Increase time)  Dynamic Standing Balance Dynamic Standing - Balance Support: No upper extremity supported Dynamic Standing - Level of Assistance: 6: Modified independent (Device/Increase time) Extremity Assessment      RLE Assessment RLE Assessment: Within Functional Limits LLE Assessment LLE Assessment: Within Functional Limits (slightly weaker on L due to previous CVA, grossly 3+/5)  See FIM for current functional status  Raquel Sarna A Marya Lowden 03/22/2014, 8:17 AM

## 2014-03-22 NOTE — Plan of Care (Signed)
Problem: RH Stairs Goal: LTG Patient will ambulate up and down stairs w/assist (PT) LTG: Patient will ambulate up and down # of stairs with assistance (PT)  Outcome: Not Met (add Reason) Pt able to complete up to 15 steps with handrail, however requires supervision on last day of therapy.

## 2014-03-22 NOTE — Progress Notes (Signed)
Coumadin per pharmacy  Anticoag: coumadin for AVR, Heparin stopped 5/2, earlier had FOB+ but CCM ok with AC. Coumadin 5mg  PO daily PTA per pt but ?noncompliant (anticoag office note 03/01/14 notes dose as 7.5mg  x1 then 5mg  MWF, 2.5mg  TTSS), 5/3: repeat CBC Hg = 10.8 [place] 7.5 must be lab error. No bleeding INR 2.58 therapeutic  Goal  INR 2.5-3.5 per Neuro  Plan:  1. coum 7.5 mg po x1 (may need b/w 7.5mg  to 10 mg daily of coumadin),  If d/c today, rec to recheck INR at outpatient tomorrow to reassess dosing -- Coumadin was educated on 03/11/14 - will be discharged today

## 2014-03-23 ENCOUNTER — Telehealth: Payer: Self-pay | Admitting: *Deleted

## 2014-03-23 NOTE — Telephone Encounter (Signed)
Calling for  Information on Arretta's sliding scale insulin after discharge from hospital.  I have given her the PA office # at [facility] for her to speak to Tressa Busman PA about this.

## 2014-03-24 ENCOUNTER — Telehealth: Payer: Self-pay | Admitting: Family

## 2014-03-24 LAB — POCT INR: INR: 4.2

## 2014-03-24 NOTE — Telephone Encounter (Signed)
[facility] calling to provide the results for pt's PT/INR; INR -4.2 PT-50.7 pt high today, informed pt to hold off the meds until she hear from [place].

## 2014-03-24 NOTE — Telephone Encounter (Signed)
Spoke with pt and she states that [facility] is on the way to her home

## 2014-03-24 NOTE — Telephone Encounter (Signed)
FYI

## 2014-03-24 NOTE — Telephone Encounter (Signed)
[facility] TO GO TO THE HOME

## 2014-03-24 NOTE — Telephone Encounter (Signed)
Pt was scheduled to have PT INR drawn on 03/23/14, but Dawn from [facility] states she is unable to contact patient.  She left a VM on the pt's emergency contact but no one has called her back.  Dawn states there pt will not have regularly scheduled PT INR lab results.

## 2014-03-24 NOTE — Telephone Encounter (Signed)
Patient is aware to hold coumadin for today.

## 2014-03-25 ENCOUNTER — Ambulatory Visit (INDEPENDENT_AMBULATORY_CARE_PROVIDER_SITE_OTHER): Payer: Commercial Managed Care - HMO | Admitting: General Practice

## 2014-03-25 DIAGNOSIS — Z954 Presence of other heart-valve replacement: Secondary | ICD-10-CM

## 2014-03-25 DIAGNOSIS — Z952 Presence of prosthetic heart valve: Secondary | ICD-10-CM

## 2014-03-25 DIAGNOSIS — I359 Nonrheumatic aortic valve disorder, unspecified: Secondary | ICD-10-CM

## 2014-03-25 DIAGNOSIS — Z5181 Encounter for therapeutic drug level monitoring: Secondary | ICD-10-CM

## 2014-03-25 NOTE — Progress Notes (Signed)
Pre visit review using our clinic review tool, if applicable. No additional management support is needed unless otherwise documented below in the visit note. 

## 2014-03-26 ENCOUNTER — Telehealth: Payer: Self-pay

## 2014-03-26 ENCOUNTER — Telehealth: Payer: Self-pay | Admitting: Family

## 2014-03-26 NOTE — Telephone Encounter (Signed)
Darl Pikes from [facility] called to ask when this pt need her next coumadin check because she will need to come here and have it done. They are discharging her she is not home bound and her insurance will not cover. If any questions can contact Darl Pikes [phone]

## 2014-03-26 NOTE — Telephone Encounter (Signed)
Pt's home health nurse Darl Pikes called stating that since the pt came out of Rehab the lowest her sugar has been is 354. Nurse states the pt is currently taking 20 units of Novolog 70/30 two times daily.   Please advise, Thanks!

## 2014-03-26 NOTE — Telephone Encounter (Signed)
PT and Home Health informed of new instructions. Pt states she will call back on Monday to set appointment up.

## 2014-03-26 NOTE — Telephone Encounter (Signed)
Please increase to 30 units bid Ov 3-4 days

## 2014-03-30 NOTE — Telephone Encounter (Signed)
Spoke with pt.  Appt scheduled at [facility].

## 2014-03-31 ENCOUNTER — Ambulatory Visit: Payer: Medicare HMO | Admitting: Family

## 2014-03-31 DIAGNOSIS — Z5181 Encounter for therapeutic drug level monitoring: Secondary | ICD-10-CM

## 2014-03-31 DIAGNOSIS — I359 Nonrheumatic aortic valve disorder, unspecified: Secondary | ICD-10-CM

## 2014-03-31 DIAGNOSIS — Z952 Presence of prosthetic heart valve: Secondary | ICD-10-CM

## 2014-03-31 LAB — POCT INR: INR: 7

## 2014-03-31 NOTE — Patient Instructions (Signed)
Hold Coumadin and recheck in 3 days.

## 2014-04-02 ENCOUNTER — Telehealth: Payer: Self-pay | Admitting: Family

## 2014-04-02 NOTE — Telephone Encounter (Signed)
Per Padonda, 10mg  today only then reduce to 5mg  qd and recheck in 1 week.   Instructions verified by 

## 2014-04-02 NOTE — Telephone Encounter (Signed)
I & R  1.2 coum on hold all week b/c pt was high [place] needs to speak with you!

## 2014-04-07 ENCOUNTER — Telehealth: Payer: Self-pay

## 2014-04-07 ENCOUNTER — Ambulatory Visit (INDEPENDENT_AMBULATORY_CARE_PROVIDER_SITE_OTHER): Payer: Commercial Managed Care - HMO | Admitting: General Practice

## 2014-04-07 DIAGNOSIS — Z5181 Encounter for therapeutic drug level monitoring: Secondary | ICD-10-CM

## 2014-04-07 DIAGNOSIS — Z954 Presence of other heart-valve replacement: Secondary | ICD-10-CM

## 2014-04-07 DIAGNOSIS — Z952 Presence of prosthetic heart valve: Secondary | ICD-10-CM

## 2014-04-07 DIAGNOSIS — I359 Nonrheumatic aortic valve disorder, unspecified: Secondary | ICD-10-CM

## 2014-04-07 LAB — POCT INR: INR: 2.7

## 2014-04-07 NOTE — Telephone Encounter (Signed)
Pt scheduled of appointment on 04/28/2014.

## 2014-04-07 NOTE — Telephone Encounter (Signed)
Ov is due 

## 2014-04-07 NOTE — Telephone Encounter (Signed)
Home Health Nurse Erie Noe called stating her fasting blood glucose levels was 51 this morning. Confirmed that pt is taking 30 unts of Novolog 2 time a day.  Please advise if anything should be done. Thanks!

## 2014-04-07 NOTE — Progress Notes (Signed)
Pre visit review using our clinic review tool, if applicable. No additional management support is needed unless otherwise documented below in the visit note. 

## 2014-04-08 DIAGNOSIS — G909 Disorder of the autonomic nervous system, unspecified: Secondary | ICD-10-CM

## 2014-04-08 DIAGNOSIS — I10 Essential (primary) hypertension: Secondary | ICD-10-CM

## 2014-04-08 DIAGNOSIS — Z7901 Long term (current) use of anticoagulants: Secondary | ICD-10-CM

## 2014-04-08 DIAGNOSIS — I25810 Atherosclerosis of coronary artery bypass graft(s) without angina pectoris: Secondary | ICD-10-CM | POA: Diagnosis not present

## 2014-04-08 DIAGNOSIS — I69959 Hemiplegia and hemiparesis following unspecified cerebrovascular disease affecting unspecified side: Secondary | ICD-10-CM | POA: Diagnosis not present

## 2014-04-08 DIAGNOSIS — M069 Rheumatoid arthritis, unspecified: Secondary | ICD-10-CM

## 2014-04-08 DIAGNOSIS — E1149 Type 2 diabetes mellitus with other diabetic neurological complication: Secondary | ICD-10-CM

## 2014-04-14 ENCOUNTER — Ambulatory Visit: Payer: Commercial Managed Care - HMO | Admitting: Family

## 2014-04-15 ENCOUNTER — Telehealth: Payer: Self-pay | Admitting: Family

## 2014-04-15 NOTE — Telephone Encounter (Signed)
Spoke with Bethann Berkshire to advise continue current dose and recheck in 1 week pending abx use for possible UTI. Pt will be seen in office tomorrow

## 2014-04-15 NOTE — Telephone Encounter (Signed)
Madison [name] is calling to report patient PROTIME 33.0  INR 2.7. Pt is currently take coumadin 5 mg daily. Pt has no bleeding problem

## 2014-04-16 ENCOUNTER — Encounter: Payer: Self-pay | Admitting: Family

## 2014-04-16 ENCOUNTER — Ambulatory Visit (INDEPENDENT_AMBULATORY_CARE_PROVIDER_SITE_OTHER): Payer: Commercial Managed Care - HMO | Admitting: Family

## 2014-04-16 VITALS — BP 124/54 | HR 102 | Temp 100.2°F | Wt 154.0 lb

## 2014-04-16 DIAGNOSIS — Z952 Presence of prosthetic heart valve: Secondary | ICD-10-CM

## 2014-04-16 DIAGNOSIS — Z5181 Encounter for therapeutic drug level monitoring: Secondary | ICD-10-CM

## 2014-04-16 DIAGNOSIS — I359 Nonrheumatic aortic valve disorder, unspecified: Secondary | ICD-10-CM

## 2014-04-16 DIAGNOSIS — R300 Dysuria: Secondary | ICD-10-CM

## 2014-04-16 DIAGNOSIS — Z954 Presence of other heart-valve replacement: Secondary | ICD-10-CM

## 2014-04-16 LAB — POCT URINALYSIS DIPSTICK
Bilirubin, UA: NEGATIVE
Nitrite, UA: NEGATIVE
PROTEIN UA: NEGATIVE
RBC UA: NEGATIVE
Spec Grav, UA: <=1.005
UROBILINOGEN UA: 0.2
pH, UA: 5.5

## 2014-04-16 NOTE — Progress Notes (Signed)
Subjective:    Patient ID: Madison [name], female    DOB: June 29, 1961, 53 y.o.   MRN: [id number]  HPI She presents s/p hospitalization last Tuesday 5/19 in [place] for hypoglycemia. She was staying where she did not have access to snacks/food. Her son found her unresponsive and called EMS. At the hospital, she was told her CBG was 23.  She was advised to reduce her Novalog from 30 units in am and pm to 15 units. She resumed 30 units again today. Her CBG today was 450.   Review of Systems  Constitutional: Positive for fever. Negative for diaphoresis, activity change, appetite change, fatigue and unexpected weight change.  HENT: Negative.   Eyes: Negative.   Respiratory: Negative.   Cardiovascular: Negative.   Gastrointestinal: Negative.   Endocrine: Negative.   Genitourinary: Negative.   Musculoskeletal: Negative.   Skin: Negative.   Allergic/Immunologic: Negative.   Neurological: Negative.   Hematological: Negative.   Psychiatric/Behavioral: Negative.    Past Medical History  Diagnosis Date  . CAD (coronary artery disease)   . Obesity   . Hypercholesteremia   . HTN (hypertension)   . Dyslipidemia   . Stroke syndrome   . Aortic stenosis   . Thrombophlebitis   . Diabetes   . Hyperlipidemia   . Rheumatoid arthritis(714.0)     History   Social History  . Marital Status: Single    Spouse Name: N/A    Number of Children: N/A  . Years of Education: N/A   Occupational History  . Not on file.   Social History Main Topics  . Smoking status: Never Smoker   . Smokeless tobacco: Not on file  . Alcohol Use: No  . Drug Use: No  . Sexual Activity: Not on file   Other Topics Concern  . Not on file   Social History Narrative  . No narrative on file    Past Surgical History  Procedure Laterality Date  . Cesarean section    . Foot fracture surgery    . Knee surgery    . Neuroplasty / transposition median nerve at carpal tunnel      Family History  Problem Relation Age  of Onset  . Stroke      Allergies  Allergen Reactions  . Celebrex [Celecoxib] Rash  . Detrol [Tolterodine]     Current Outpatient Prescriptions on File Prior to Visit  Medication Sig Dispense Refill  . aspirin 81 MG tablet Take 1 tablet (81 mg total) by mouth daily.  30 tablet    . baclofen (LIORESAL) 10 MG tablet Take 1 tablet (10 mg total) by mouth 2 (two) times daily.  60 each  0  . Doxepin HCl (SILENOR) 6 MG TABS Take 6 mg by mouth at bedtime.      Marland Kitchen escitalopram (LEXAPRO) 5 MG tablet Take 1 tablet (5 mg total) by mouth daily.  30 tablet  1  . insulin aspart protamine- aspart (NOVOLOG MIX 70/30) (70-30) 100 UNIT/ML injection Inject 0.2 mLs (20 Units total) into the skin 2 (two) times daily with a meal.  10 mL  11  . omeprazole (PRILOSEC) 40 MG capsule Take 1 capsule (40 mg total) by mouth daily.  90 capsule  3  . pravastatin (PRAVACHOL) 20 MG tablet Take 1 tablet (20 mg total) by mouth daily.  90 tablet  0  . ramelteon (ROZEREM) 8 MG tablet Take 1 tablet (8 mg total) by mouth at bedtime.  30 tablet  0  . solifenacin (  VESICARE) 5 MG tablet Take 5 mg by mouth daily.      Marland Kitchen warfarin (COUMADIN) 7.5 MG tablet Take 1 tablet (7.5 mg total) by mouth one time only at 6 PM.  30 tablet  1   No current facility-administered medications on file prior to visit.    BP 124/54  Pulse 102  Temp(Src) 100.2 F (37.9 C) (Oral)  Wt 154 lb (69.854 kg)  SpO2 96%chart    Objective:   Physical Exam  Constitutional: She is oriented to person, place, and time. No distress.  HENT:  Head: Normocephalic and atraumatic.  Neck: Normal range of motion. No thyromegaly present.  Cardiovascular: Regular rhythm.   Pulmonary/Chest: Effort normal and breath sounds normal.  Abdominal: Soft. Bowel sounds are normal.  Musculoskeletal: Normal range of motion.  Neurological: She is alert and oriented to person, place, and time.  Skin: Skin is warm. She is not diaphoretic.  Psychiatric: She has a normal mood and  affect. Her behavior is normal. Judgment normal.      Assessment & Plan:  Refugia was seen today for urinary tract infection.  Diagnoses and associated orders for this visit:  Dysuria - POCT urinalysis dipstick - Culture, Urine  Encounter for therapeutic drug monitoring  Aortic valve disorders  S/P AVR   Call the office with any questions or concerns.

## 2014-04-16 NOTE — Patient Instructions (Addendum)
Please keep your appointment on June 10 with Dr. Everardo All.  Your urine culture is pending. You will be notified if an antibiotic is indicated.  If fever, chills, sweats, or nausea/vomiting develop, follow up as soon as possible.   Hypoglycemia (Low Blood Sugar) Hypoglycemia is when the glucose (sugar) in your blood is too low. Hypoglycemia can happen for many reasons. It can happen to people with or without diabetes. Hypoglycemia can develop quickly and can be a medical emergency.  CAUSES  Having hypoglycemia does not mean that you will develop diabetes. Different causes include:  Missed or delayed meals or not enough carbohydrates eaten.  Medication overdose. This could be by accident or deliberate. If by accident, your medication may need to be adjusted or changed.  Exercise or increased activity without adjustments in carbohydrates or medications.  A nerve disorder that affects body functions like your heart rate, blood pressure and digestion (autonomic neuropathy).  A condition where the stomach muscles do not function properly (gastroparesis). Therefore, medications may not absorb properly.  The inability to recognize the signs of hypoglycemia (hypoglycemic unawareness).  Absorption of insulin  may be altered.  Alcohol consumption.  Pregnancy/menstrual cycles/postpartum. This may be due to hormones.  Certain kinds of tumors. This is very rare. SYMPTOMS   Sweating.  Hunger.  Dizziness.  Blurred vision.  Drowsiness.  Weakness.  Headache.  Rapid heart beat.  Shakiness.  Nervousness. DIAGNOSIS  Diagnosis is made by monitoring blood glucose in one or all of the following ways:  Fingerstick blood glucose monitoring.  Laboratory results. TREATMENT  If you think your blood glucose is low:  Check your blood glucose, if possible. If it is less than 70 mg/dl, take one of the following:  3-4 glucose tablets.   cup juice (prefer clear like apple).   cup  "regular" soda pop.  1 cup milk.  -1 tube of glucose gel.  5-6 hard candies.  Do not over treat because your blood glucose (sugar) will only go too high.  Wait 15 minutes and recheck your blood glucose. If it is still less than 70 mg/dl (or below your target range), repeat treatment.  Eat a snack if it is more than one hour until your next meal. Sometimes, your blood glucose may go so low that you are unable to treat yourself. You may need someone to help you. You may even pass out or be unable to swallow. This may require you to get an injection of glucagon, which raises the blood glucose. HOME CARE INSTRUCTIONS  Check blood glucose as recommended by your caregiver.  Take medication as prescribed by your caregiver.  Follow your meal plan. Do not skip meals. Eat on time.  If you are going to drink alcohol, drink it only with meals.  Check your blood glucose before driving.  Check your blood glucose before and after exercise. If you exercise longer or different than usual, be sure to check blood glucose more frequently.  Always carry treatment with you. Glucose tablets are the easiest to carry.  Always wear medical alert jewelry or carry some form of identification that states that you have diabetes. This will alert people that you have diabetes. If you have hypoglycemia, they will have a better idea on what to do. SEEK MEDICAL CARE IF:   You are having problems keeping your blood sugar at target range.  You are having frequent episodes of hypoglycemia.  You feel you might be having side effects from your medicines.  You have  symptoms of an illness that is not improving after 3-4 days.  You notice a change in vision or a new problem with your vision. SEEK IMMEDIATE MEDICAL CARE IF:   You are a family member or friend of a person whose blood glucose goes below 70 mg/dl and is accompanied by:  Confusion.  A change in mental status.  The inability to swallow.  Passing  out. Document Released: 11/05/2005 Document Revised: 01/28/2012 Document Reviewed: 03/03/2012 [facility] Patient Information 2014 [place], [place].

## 2014-04-18 LAB — URINE CULTURE
Colony Count: NO GROWTH
Organism ID, Bacteria: NO GROWTH

## 2014-04-22 ENCOUNTER — Ambulatory Visit (INDEPENDENT_AMBULATORY_CARE_PROVIDER_SITE_OTHER): Payer: Commercial Managed Care - HMO | Admitting: General Practice

## 2014-04-22 ENCOUNTER — Telehealth: Payer: Self-pay | Admitting: Family

## 2014-04-22 DIAGNOSIS — Z954 Presence of other heart-valve replacement: Secondary | ICD-10-CM

## 2014-04-22 DIAGNOSIS — I359 Nonrheumatic aortic valve disorder, unspecified: Secondary | ICD-10-CM

## 2014-04-22 DIAGNOSIS — Z952 Presence of prosthetic heart valve: Secondary | ICD-10-CM

## 2014-04-22 DIAGNOSIS — Z5181 Encounter for therapeutic drug level monitoring: Secondary | ICD-10-CM

## 2014-04-22 LAB — POCT INR: INR: 5.1

## 2014-04-22 NOTE — Progress Notes (Signed)
Pre visit review using our clinic review tool, if applicable. No additional management support is needed unless otherwise documented below in the visit note. 

## 2014-04-22 NOTE — Telephone Encounter (Signed)
See below

## 2014-04-22 NOTE — Telephone Encounter (Signed)
Pt I & R is  5.1 Taking 5mg  of coum daily ahc will be back to pt next wed 6/10 to reck Or will ck next week whatever day you choose will wait for your call

## 2014-04-26 ENCOUNTER — Ambulatory Visit (INDEPENDENT_AMBULATORY_CARE_PROVIDER_SITE_OTHER): Payer: Commercial Managed Care - HMO | Admitting: General Practice

## 2014-04-26 DIAGNOSIS — Z952 Presence of prosthetic heart valve: Secondary | ICD-10-CM

## 2014-04-26 DIAGNOSIS — I359 Nonrheumatic aortic valve disorder, unspecified: Secondary | ICD-10-CM

## 2014-04-26 DIAGNOSIS — Z5181 Encounter for therapeutic drug level monitoring: Secondary | ICD-10-CM

## 2014-04-26 DIAGNOSIS — Z954 Presence of other heart-valve replacement: Secondary | ICD-10-CM

## 2014-04-26 LAB — POCT INR: INR: 2.4

## 2014-04-26 NOTE — Progress Notes (Signed)
Pre visit review using our clinic review tool, if applicable. No additional management support is needed unless otherwise documented below in the visit note. 

## 2014-04-27 ENCOUNTER — Ambulatory Visit (HOSPITAL_BASED_OUTPATIENT_CLINIC_OR_DEPARTMENT_OTHER): Payer: Commercial Managed Care - HMO | Admitting: Physical Medicine & Rehabilitation

## 2014-04-27 ENCOUNTER — Encounter: Payer: Medicare HMO | Attending: Physical Medicine & Rehabilitation

## 2014-04-27 ENCOUNTER — Encounter: Payer: Self-pay | Admitting: Physical Medicine & Rehabilitation

## 2014-04-27 VITALS — BP 101/58 | HR 108 | Resp 14 | Ht 63.0 in | Wt 151.6 lb

## 2014-04-27 DIAGNOSIS — I10 Essential (primary) hypertension: Secondary | ICD-10-CM | POA: Insufficient documentation

## 2014-04-27 DIAGNOSIS — I69920 Aphasia following unspecified cerebrovascular disease: Secondary | ICD-10-CM | POA: Insufficient documentation

## 2014-04-27 DIAGNOSIS — I2510 Atherosclerotic heart disease of native coronary artery without angina pectoris: Secondary | ICD-10-CM | POA: Insufficient documentation

## 2014-04-27 DIAGNOSIS — E7800 Pure hypercholesterolemia, unspecified: Secondary | ICD-10-CM | POA: Insufficient documentation

## 2014-04-27 DIAGNOSIS — Z954 Presence of other heart-valve replacement: Secondary | ICD-10-CM | POA: Diagnosis not present

## 2014-04-27 DIAGNOSIS — Z951 Presence of aortocoronary bypass graft: Secondary | ICD-10-CM | POA: Insufficient documentation

## 2014-04-27 DIAGNOSIS — E785 Hyperlipidemia, unspecified: Secondary | ICD-10-CM | POA: Insufficient documentation

## 2014-04-27 DIAGNOSIS — I69959 Hemiplegia and hemiparesis following unspecified cerebrovascular disease affecting unspecified side: Secondary | ICD-10-CM | POA: Diagnosis present

## 2014-04-27 DIAGNOSIS — M069 Rheumatoid arthritis, unspecified: Secondary | ICD-10-CM | POA: Diagnosis not present

## 2014-04-27 DIAGNOSIS — E119 Type 2 diabetes mellitus without complications: Secondary | ICD-10-CM | POA: Diagnosis not present

## 2014-04-27 DIAGNOSIS — G8110 Spastic hemiplegia affecting unspecified side: Secondary | ICD-10-CM

## 2014-04-27 NOTE — Progress Notes (Signed)
Subjective:    Patient ID: Madison [name], female    DOB: 06/01/1961, 53 y.o.   MRN: [id number]  HPI 53 year old right-handed female with history of CAD with CABG as well as aortic valve replacement on chronic Coumadin. The patient is independent prior to admission with activities of daily living and mobility.  Admitted on March 08, 2014, with altered mental status, hypoglycemia, and acute respiratory failure.  The patient was intubated in the emergency room.  Noted blood sugar of 1124 as well as potassium 7.2.  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  INR on admission of 1.37.  Prior CVAs in 2010 and 2012, CT showed encephalomalacia Left Parietal  Son with ADHD, age 12 adopted, DSS was at her home yesterday after her son went outside without her permission and was wandering the neighborhood. By her report she was not found to be negligent No falls at home  Home health eval but pt not homebound Older son will be here in 2 weeks, he has a car and would be able to transport her to appointments  Pain Inventory Average Pain 0 Pain Right Now 0 My pain is no pain  In the last 24 hours, has pain interfered with the following? General activity 0 Relation with others 0 Enjoyment of life 0 What TIME of day is your pain at its worst? morning Sleep (in general) Fair  Pain is worse with: no pain Pain improves with: no pain Relief from Meds: no pain  Mobility walk without assistance walk with assistance use a walker how many minutes can you walk? 30 ability to climb steps?  yes do you drive?  no  Function not employed: date last employed . disabled: date disabled . I need assistance with the following:  household duties and shopping  Neuro/Psych spasms depression anxiety  Prior Studies Any changes since last visit?  no  Physicians involved in your care Any changes since last visit?  no   Family  History  Problem Relation Age of Onset  . Stroke     History   Social History  . Marital Status: Single    Spouse Name: N/A    Number of Children: N/A  . Years of Education: N/A   Social History Main Topics  . Smoking status: Never Smoker   . Smokeless tobacco: None  . Alcohol Use: No  . Drug Use: No  . Sexual Activity: None   Other Topics Concern  . None   Social History Narrative  . None   Past Surgical History  Procedure Laterality Date  . Cesarean section    . Foot fracture surgery    . Knee surgery    . Neuroplasty / transposition median nerve at carpal tunnel     Past Medical History  Diagnosis Date  . CAD (coronary artery disease)   . Obesity   . Hypercholesteremia   . HTN (hypertension)   . Dyslipidemia   . Stroke syndrome   . Aortic stenosis   . Thrombophlebitis   . Diabetes   . Hyperlipidemia   . Rheumatoid arthritis(714.0)    BP 101/58  Pulse 108  Resp 14  Ht 5\' 3"  (1.6 m)  Wt 151 lb 9.6 oz (68.765 kg)  BMI 26.86 kg/m2  SpO2 97%  Opioid Risk Score:   Fall Risk Score: Low Fall Risk (0-5 points) (educated and handout given for fall prevention in the home) Review of Systems  Musculoskeletal: Positive for gait problem.  Psychiatric/Behavioral: Positive for dysphoric mood. The patient is nervous/anxious.   All other systems reviewed and are negative.      Objective:   Physical Exam  Ashworth grade 2 in the left finger flexors with the exception of the left index finger which is Ashworth grade 3. Wrist flexors R. Ashworth grade 2 Motor strength 3 minus left deltoid, bicep, tricep, grip 4 minus at the left hip flexor knee extensor ankle dorsiflexor plantar flexor 5/5 on the right side Ambulates with left hip hike no evidence of knee instability no assistive device     Assessment & Plan:  #1. 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 Right CVA 03/08/2014. History of prior CVAs which  likely accounts for  aphasia, left parietal encephalomalacia noted on the most recent CT of the head on 03/08/2014  Followup cardiology as well as neurology  2. Diabetes seeing her primary care physician on regular basis, will also follow up with endocrinology    Recommend outpatient therapy PT OT and speech once she has transportation in about 2 weeks. Not safe to drive

## 2014-04-27 NOTE — Patient Instructions (Signed)
Next appt is for botox injection  Also have made a referral to outpt PT,OT,ST they will contact you for a time and date

## 2014-04-28 ENCOUNTER — Encounter: Payer: Self-pay | Admitting: Endocrinology

## 2014-04-28 ENCOUNTER — Ambulatory Visit (INDEPENDENT_AMBULATORY_CARE_PROVIDER_SITE_OTHER): Payer: Commercial Managed Care - HMO | Admitting: Endocrinology

## 2014-04-28 VITALS — BP 120/70 | HR 90 | Temp 98.6°F | Ht 63.0 in | Wt 154.0 lb

## 2014-04-28 DIAGNOSIS — E1110 Type 2 diabetes mellitus with ketoacidosis without coma: Secondary | ICD-10-CM

## 2014-04-28 DIAGNOSIS — F329 Major depressive disorder, single episode, unspecified: Secondary | ICD-10-CM

## 2014-04-28 DIAGNOSIS — F32A Depression, unspecified: Secondary | ICD-10-CM

## 2014-04-28 DIAGNOSIS — F3289 Other specified depressive episodes: Secondary | ICD-10-CM

## 2014-04-28 MED ORDER — INSULIN GLARGINE 100 UNIT/ML SOLOSTAR PEN: 50.0000 [IU] | PEN_INJECTOR | Freq: Every day | SUBCUTANEOUS | Status: DC

## 2014-04-28 NOTE — Progress Notes (Signed)
Subjective:    Patient ID: Madison [name], female    DOB: 1961/09/28, 53 y.o.   MRN: [id number]  HPI pt returns for f/u of insulin-requiring DM  (dx'ed 1981, on a routine blood test; she has moderate neuropathy of the lower extremities; she has associated retinopathy, CVA's, and CAD; she has been on insulin since dx; she has never had pancreatitis; last episode of DKA was in April of 2015; she was on insulin pump rx until CVA's prevented her from using the device in early 2014; she declined to dose mealtime insulin based on CHO counting).  She has not recently taken steroids.  She had an episode of severe hypoglycemia 3 weeks ago.  no cbg record, but states cbg's still vary widely.   Past Medical History  Diagnosis Date  . CAD (coronary artery disease)   . Obesity   . Hypercholesteremia   . HTN (hypertension)   . Dyslipidemia   . Stroke syndrome   . Aortic stenosis   . Thrombophlebitis   . Diabetes   . Hyperlipidemia   . Rheumatoid arthritis(714.0)     Past Surgical History  Procedure Laterality Date  . Cesarean section    . Foot fracture surgery    . Knee surgery    . Neuroplasty / transposition median nerve at carpal tunnel      History   Social History  . Marital Status: Single    Spouse Name: N/A    Number of Children: N/A  . Years of Education: N/A   Occupational History  . Not on file.   Social History Main Topics  . Smoking status: Never Smoker   . Smokeless tobacco: Not on file  . Alcohol Use: No  . Drug Use: No  . Sexual Activity: Not on file   Other Topics Concern  . Not on file   Social History Narrative  . No narrative on file    Current Outpatient Prescriptions on File Prior to Visit  Medication Sig Dispense Refill  . aspirin 81 MG tablet Take 1 tablet (81 mg total) by mouth daily.  30 tablet    . baclofen (LIORESAL) 10 MG tablet Take 1 tablet (10 mg total) by mouth 2 (two) times daily.  60 each  0  . Doxepin HCl (SILENOR) 6 MG TABS Take 6 mg by  mouth at bedtime.      Marland Kitchen escitalopram (LEXAPRO) 5 MG tablet Take 1 tablet (5 mg total) by mouth daily.  30 tablet  1  . omeprazole (PRILOSEC) 40 MG capsule Take 1 capsule (40 mg total) by mouth daily.  90 capsule  3  . pravastatin (PRAVACHOL) 20 MG tablet Take 1 tablet (20 mg total) by mouth daily.  90 tablet  0  . ramelteon (ROZEREM) 8 MG tablet Take 1 tablet (8 mg total) by mouth at bedtime.  30 tablet  0  . solifenacin (VESICARE) 5 MG tablet Take 5 mg by mouth daily.      Marland Kitchen warfarin (COUMADIN) 7.5 MG tablet Take 1 tablet (7.5 mg total) by mouth one time only at 6 PM.  30 tablet  1   No current facility-administered medications on file prior to visit.    Allergies  Allergen Reactions  . Celebrex [Celecoxib] Rash  . Detrol [Tolterodine]     Family History  Problem Relation Age of Onset  . Stroke      BP 120/70  Pulse 90  Temp(Src) 98.6 F (37 C) (Oral)  Ht 5\' 3"  (1.6  m)  Wt 154 lb (69.854 kg)  BMI 27.29 kg/m2  SpO2 96%  Review of Systems She denies weight change and blurry vision.      Objective:   Physical Exam Pulses: dorsalis pedis intact bilat.  Feet: no deformity. feet are of normal temp. no edema  Skin: no ulcer on the feet.  Neuro: sensation is intact to touch on the feet, but decreased from normal  Lab Results  Component Value Date   HGBA1C 11.7* 03/11/2014      Assessment & Plan:  DM: severe side-effect of hypoglycemia. Depression: this is complicating the rx of her DM: in this context, she should be on a simpler insulin regimen.     Patient is advised the following: Patient Instructions  check your blood sugar 4 times a day: before the 3 meals, and at bedtime.  also check if you have symptoms of your blood sugar being too high or too low.  please keep a record of the readings and bring it to your next appointment here.  You can write it on any piece of paper.  please call us sooner if your blood sugar goes below 70, or if you have a lot of readings over  200.   Please come back for a follow-up appointment in 3 weeks.   Please change your current insulin to lantus, 50 units each morning.

## 2014-04-28 NOTE — Patient Instructions (Addendum)
check your blood sugar 4 times a day: before the 3 meals, and at bedtime.  also check if you have symptoms of your blood sugar being too high or too low.  please keep a record of the readings and bring it to your next appointment here.  You can write it on any piece of paper.  please call us sooner if your blood sugar goes below 70, or if you have a lot of readings over 200.   Please come back for a follow-up appointment in 3 weeks.   Please change your current insulin to lantus, 50 units each morning.

## 2014-04-29 DIAGNOSIS — F329 Major depressive disorder, single episode, unspecified: Secondary | ICD-10-CM | POA: Insufficient documentation

## 2014-04-29 DIAGNOSIS — F32A Depression, unspecified: Secondary | ICD-10-CM | POA: Insufficient documentation

## 2014-05-03 ENCOUNTER — Ambulatory Visit (INDEPENDENT_AMBULATORY_CARE_PROVIDER_SITE_OTHER): Payer: Commercial Managed Care - HMO | Admitting: General Practice

## 2014-05-03 DIAGNOSIS — I359 Nonrheumatic aortic valve disorder, unspecified: Secondary | ICD-10-CM

## 2014-05-03 DIAGNOSIS — Z952 Presence of prosthetic heart valve: Secondary | ICD-10-CM

## 2014-05-03 DIAGNOSIS — Z954 Presence of other heart-valve replacement: Secondary | ICD-10-CM

## 2014-05-03 DIAGNOSIS — Z5181 Encounter for therapeutic drug level monitoring: Secondary | ICD-10-CM

## 2014-05-03 LAB — POCT INR: INR: 1.9

## 2014-05-03 NOTE — Progress Notes (Signed)
Pre visit review using our clinic review tool, if applicable. No additional management support is needed unless otherwise documented below in the visit note. 

## 2014-05-11 ENCOUNTER — Ambulatory Visit (INDEPENDENT_AMBULATORY_CARE_PROVIDER_SITE_OTHER): Payer: Commercial Managed Care - HMO | Admitting: Family Medicine

## 2014-05-11 DIAGNOSIS — Z952 Presence of prosthetic heart valve: Secondary | ICD-10-CM

## 2014-05-11 DIAGNOSIS — I359 Nonrheumatic aortic valve disorder, unspecified: Secondary | ICD-10-CM

## 2014-05-11 DIAGNOSIS — Z5181 Encounter for therapeutic drug level monitoring: Secondary | ICD-10-CM

## 2014-05-11 DIAGNOSIS — Z954 Presence of other heart-valve replacement: Secondary | ICD-10-CM

## 2014-05-11 LAB — POCT INR: INR: 1.3

## 2014-05-19 ENCOUNTER — Encounter: Payer: Self-pay | Admitting: Endocrinology

## 2014-05-19 ENCOUNTER — Ambulatory Visit (INDEPENDENT_AMBULATORY_CARE_PROVIDER_SITE_OTHER): Payer: Commercial Managed Care - HMO | Admitting: Endocrinology

## 2014-05-19 VITALS — BP 132/72 | HR 79 | Temp 98.3°F | Ht 63.0 in | Wt 164.0 lb

## 2014-05-19 DIAGNOSIS — E1049 Type 1 diabetes mellitus with other diabetic neurological complication: Secondary | ICD-10-CM

## 2014-05-19 NOTE — Patient Instructions (Addendum)
check your blood sugar 4 times a day: before the 3 meals, and at bedtime.  also check if you have symptoms of your blood sugar being too high or too low.  please keep a record of the readings and bring it to your next appointment here.  You can write it on any piece of paper.  please call us sooner if your blood sugar goes below 70, or if you have a lot of readings over 200.   Please come back for a follow-up appointment in 2 months.   Please decrease the lantus to 48 units each morning.

## 2014-05-19 NOTE — Progress Notes (Signed)
Subjective:    Patient ID: Madison [name], female    DOB: [birth year], 53 y.o.   MRN: [id number]  HPI pt returns for f/u of insulin-requiring DM  (dx'ed 1981, on a routine blood test; she has moderate neuropathy of the lower extremities; she has associated retinopathy, CVA's, and CAD; she has been on insulin since dx; she has never had pancreatitis; last episode of DKA was in April of 2015; last episode of severe hypoglycemia was in 2014; she was on insulin pump rx until CVA's prevented her from using the device in early 2014; in early 2015, she was changed to qd insulin, due to poor results with multiple daily injections).  she brings a record of her cbg's which i have reviewed today.  It varies from 57-400, but most are in the 100's.  It is in general higher as the day goes on.  The very high cbg happened only once, and was after a big meal.   Past Medical History  Diagnosis Date  . CAD (coronary artery disease)   . Obesity   . Hypercholesteremia   . HTN (hypertension)   . Dyslipidemia   . Stroke syndrome   . Aortic stenosis   . Thrombophlebitis   . Diabetes   . Hyperlipidemia   . Rheumatoid arthritis(714.0)     Past Surgical History  Procedure Laterality Date  . Cesarean section    . Foot fracture surgery    . Knee surgery    . Neuroplasty / transposition median nerve at carpal tunnel      History   Social History  . Marital Status: Single    Spouse Name: N/A    Number of Children: N/A  . Years of Education: N/A   Occupational History  . Not on file.   Social History Main Topics  . Smoking status: Never Smoker   . Smokeless tobacco: Not on file  . Alcohol Use: No  . Drug Use: No  . Sexual Activity: Not on file   Other Topics Concern  . Not on file   Social History Narrative  . No narrative on file    Current Outpatient Prescriptions on File Prior to Visit  Medication Sig Dispense Refill  . aspirin 81 MG tablet Take 1 tablet (81 mg total) by mouth daily.  30  tablet    . baclofen (LIORESAL) 10 MG tablet Take 1 tablet (10 mg total) by mouth 2 (two) times daily.  60 each  0  . Doxepin HCl (SILENOR) 6 MG TABS Take 6 mg by mouth at bedtime.      Marland Kitchen escitalopram (LEXAPRO) 5 MG tablet Take 1 tablet (5 mg total) by mouth daily.  30 tablet  1  . Insulin Glargine (LANTUS) 100 UNIT/ML Solostar Pen Inject 48 Units into the skin every morning. And pen needles 1/day      . omeprazole (PRILOSEC) 40 MG capsule Take 1 capsule (40 mg total) by mouth daily.  90 capsule  3  . pravastatin (PRAVACHOL) 20 MG tablet Take 1 tablet (20 mg total) by mouth daily.  90 tablet  0  . ramelteon (ROZEREM) 8 MG tablet Take 1 tablet (8 mg total) by mouth at bedtime.  30 tablet  0  . solifenacin (VESICARE) 5 MG tablet Take 5 mg by mouth daily.      Marland Kitchen warfarin (COUMADIN) 7.5 MG tablet Take 1 tablet (7.5 mg total) by mouth one time only at 6 PM.  30 tablet  1   No  current facility-administered medications on file prior to visit.    Allergies  Allergen Reactions  . Celebrex [Celecoxib] Rash  . Detrol [Tolterodine]     Family History  Problem Relation Age of Onset  . Stroke      BP 132/72  Pulse 79  Temp(Src) 98.3 F (36.8 C) (Oral)  Ht 5\' 3"  (1.6 m)  Wt 164 lb (74.39 kg)  BMI 29.06 kg/m2  SpO2 98%  Review of Systems Denies LOC and weight change.      Objective:   Physical Exam VITAL SIGNS:  See vs page GENERAL: no distress SKIN:  Insulin injection sites at the anterior abdomen are normal  Lab Results  Component Value Date   HGBA1C 11.7* 03/11/2014       Assessment & Plan:  DM: overcontrolled.  Hypoglycemia (new) is a new side-effect of insulin. CVA: this complicates the rx of DM: This impairs the ability to achieve glycemic control.  I'll work around this as best I can.  CAD: in this context, she should avoid hypoglycemia.  This impairs the ability to achieve glycemic control.  I'll work around this as best I can.      Patient is advised the  following: Patient Instructions  check your blood sugar 4 times a day: before the 3 meals, and at bedtime.  also check if you have symptoms of your blood sugar being too high or too low.  please keep a record of the readings and bring it to your next appointment here.  You can write it on any piece of paper.  please call 03/13/2014 sooner if your blood sugar goes below 70, or if you have a lot of readings over 200.   Please come back for a follow-up appointment in 2 months.   Please decrease the lantus to 48 units each morning.

## 2014-05-24 ENCOUNTER — Ambulatory Visit: Payer: Medicare HMO | Admitting: General Practice

## 2014-05-24 DIAGNOSIS — Z952 Presence of prosthetic heart valve: Secondary | ICD-10-CM

## 2014-05-24 DIAGNOSIS — I359 Nonrheumatic aortic valve disorder, unspecified: Secondary | ICD-10-CM

## 2014-05-24 DIAGNOSIS — Z5181 Encounter for therapeutic drug level monitoring: Secondary | ICD-10-CM

## 2014-05-24 LAB — POCT INR: INR: 1.8

## 2014-05-24 NOTE — Progress Notes (Signed)
Pre visit review using our clinic review tool, if applicable. No additional management support is needed unless otherwise documented below in the visit note. 

## 2014-05-27 ENCOUNTER — Telehealth: Payer: Self-pay | Admitting: Family

## 2014-05-27 NOTE — Telephone Encounter (Signed)
error 

## 2014-05-31 ENCOUNTER — Encounter (HOSPITAL_COMMUNITY): Payer: Self-pay | Admitting: Emergency Medicine

## 2014-05-31 ENCOUNTER — Emergency Department (HOSPITAL_COMMUNITY)
Admission: EM | Admit: 2014-05-31 | Discharge: 2014-05-31 | Disposition: A | Discharge status: Home / Self Care | Payer: Medicare HMO | Attending: Emergency Medicine | Admitting: Emergency Medicine

## 2014-05-31 ENCOUNTER — Ambulatory Visit: Payer: Medicare HMO | Admitting: Family

## 2014-05-31 DIAGNOSIS — E7800 Pure hypercholesterolemia, unspecified: Secondary | ICD-10-CM | POA: Insufficient documentation

## 2014-05-31 DIAGNOSIS — I10 Essential (primary) hypertension: Secondary | ICD-10-CM | POA: Diagnosis not present

## 2014-05-31 DIAGNOSIS — Z79899 Other long term (current) drug therapy: Secondary | ICD-10-CM | POA: Insufficient documentation

## 2014-05-31 DIAGNOSIS — E1169 Type 2 diabetes mellitus with other specified complication: Secondary | ICD-10-CM | POA: Diagnosis present

## 2014-05-31 DIAGNOSIS — Z8672 Personal history of thrombophlebitis: Secondary | ICD-10-CM | POA: Diagnosis not present

## 2014-05-31 DIAGNOSIS — E669 Obesity, unspecified: Secondary | ICD-10-CM | POA: Insufficient documentation

## 2014-05-31 DIAGNOSIS — Z7982 Long term (current) use of aspirin: Secondary | ICD-10-CM | POA: Diagnosis not present

## 2014-05-31 DIAGNOSIS — I2510 Atherosclerotic heart disease of native coronary artery without angina pectoris: Secondary | ICD-10-CM | POA: Diagnosis not present

## 2014-05-31 DIAGNOSIS — E785 Hyperlipidemia, unspecified: Secondary | ICD-10-CM | POA: Insufficient documentation

## 2014-05-31 DIAGNOSIS — Z794 Long term (current) use of insulin: Secondary | ICD-10-CM | POA: Insufficient documentation

## 2014-05-31 DIAGNOSIS — M069 Rheumatoid arthritis, unspecified: Secondary | ICD-10-CM | POA: Diagnosis not present

## 2014-05-31 DIAGNOSIS — Z7901 Long term (current) use of anticoagulants: Secondary | ICD-10-CM | POA: Insufficient documentation

## 2014-05-31 DIAGNOSIS — Z8673 Personal history of transient ischemic attack (TIA), and cerebral infarction without residual deficits: Secondary | ICD-10-CM | POA: Insufficient documentation

## 2014-05-31 DIAGNOSIS — E162 Hypoglycemia, unspecified: Secondary | ICD-10-CM

## 2014-05-31 LAB — I-STAT CHEM 8, ED
BUN: 15 mg/dL (ref 6–23)
Calcium, Ion: 1.12 mmol/L (ref 1.12–1.23)
Chloride: 106 mEq/L (ref 96–112)
Creatinine, Ser: 0.7 mg/dL (ref 0.50–1.10)
GLUCOSE: 32 mg/dL — AB (ref 70–99)
HCT: 35 % — ABNORMAL LOW (ref 36.0–46.0)
HEMOGLOBIN: 11.9 g/dL — AB (ref 12.0–15.0)
Potassium: 3.8 mEq/L (ref 3.7–5.3)
Sodium: 138 mEq/L (ref 137–147)
TCO2: 23 mmol/L (ref 0–100)

## 2014-05-31 LAB — CBC
HEMATOCRIT: 33.7 % — AB (ref 36.0–46.0)
HEMOGLOBIN: 11.2 g/dL — AB (ref 12.0–15.0)
MCH: 27.6 pg (ref 26.0–34.0)
MCHC: 33.2 g/dL (ref 30.0–36.0)
MCV: 83 fL (ref 78.0–100.0)
Platelets: 219 10*3/uL (ref 150–400)
RBC: 4.06 MIL/uL (ref 3.87–5.11)
RDW: 14.8 % (ref 11.5–15.5)
WBC: 8.4 10*3/uL (ref 4.0–10.5)

## 2014-05-31 LAB — CBG MONITORING, ED
Glucose-Capillary: 62 mg/dL — ABNORMAL LOW (ref 70–99)
Glucose-Capillary: 84 mg/dL (ref 70–99)
Glucose-Capillary: 133 mg/dL — ABNORMAL HIGH (ref 70–99)

## 2014-05-31 LAB — PROTIME-INR
INR: 5.54 (ref 0.00–1.49)
Prothrombin Time: 50.3 seconds — ABNORMAL HIGH (ref 11.6–15.2)

## 2014-05-31 MED ORDER — INSULIN GLARGINE 100 UNIT/ML SOLOSTAR PEN: 44.0000 [IU] | PEN_INJECTOR | SUBCUTANEOUS | Status: DC

## 2014-05-31 MED ORDER — DEXTROSE 50 % IV SOLN
1.0000 | Freq: Once | INTRAVENOUS | Status: AC
  Administered 2014-05-31: 50 mL via INTRAVENOUS
  Filled 2014-05-31: qty 50

## 2014-05-31 NOTE — ED Notes (Signed)
Pt states her blood sugar started dropping yesterday, states EMS came out to house yesterday but she didn't want to come to hospital, states she has eaten and taken her insulin as norm, pt states she feels fine, states could tell it had dropped low this morning though, states after oral glucose given by EMS she feels fine, states ate a PB & J on way here. Pt a/o x 4.

## 2014-05-31 NOTE — ED Provider Notes (Addendum)
CSN: [id number]     Arrival date & time 05/31/14  1427 History   First MD Initiated Contact with Patient 05/31/14 1504     Chief Complaint  Patient presents with  . Hypoglycemia     (Consider location/radiation/quality/duration/timing/severity/associated sxs/prior Treatment) The history is provided by the patient, medical records and the EMS personnel.   patient emergency department for hypoglycemia.  She follows closely with Dr. Jill Side and is on 48 units of Lantus every morning.  She states that she took her normal dose her Lantus this morning and had a normal breakfast.  EMS reports that they've frequent her house often for hypoglycemic episodes.  The patient feels as though she has rather well-controlled diabetes which is why she has low blood sugars.  Her last hemoglobin A1c was 11.  She's has a history of CVA and per Dr. George Hugh notes it sounds as though compliance with medications and management of her diabetes is difficult.  The patient reports she feels better at this time.  As reported that she had been a butter and jelly sounds throughout.  She is currently eating graham crackers and peanut butter.  EMS reports that she did not have a large breakfast and this was the report given to them by family.  The patient did not want to come to the emergency department however family requested this.  Past Medical History  Diagnosis Date  . CAD (coronary artery disease)   . Obesity   . Hypercholesteremia   . HTN (hypertension)   . Dyslipidemia   . Stroke syndrome   . Aortic stenosis   . Thrombophlebitis   . Diabetes   . Hyperlipidemia   . Rheumatoid arthritis(714.0)    Past Surgical History  Procedure Laterality Date  . Cesarean section    . Foot fracture surgery    . Knee surgery    . Neuroplasty / transposition median nerve at carpal tunnel     Family History  Problem Relation Age of Onset  . Stroke     History  Substance Use Topics  . Smoking status: Never Smoker   .  Smokeless tobacco: Not on file  . Alcohol Use: No   OB History   Grav Para Term Preterm Abortions TAB SAB Ect Mult Living                 Review of Systems  All other systems reviewed and are negative.     Allergies  Celebrex and Detrol  Home Medications   Prior to Admission medications   Medication Sig Start Date End Date Taking? Authorizing Provider  aspirin 81 MG tablet Take 1 tablet (81 mg total) by mouth daily. 03/22/14  Yes Daniel J Angiulli, PA-C  baclofen (LIORESAL) 10 MG tablet Take 1 tablet (10 mg total) by mouth 2 (two) times daily. 03/22/14  Yes Daniel J Angiulli, PA-C  escitalopram (LEXAPRO) 5 MG tablet Take 1 tablet (5 mg total) by mouth daily. 03/22/14  Yes Daniel J Angiulli, PA-C  Insulin Glargine (LANTUS) 100 UNIT/ML Solostar Pen Inject 48 Units into the skin every morning. And pen needles 1/day 04/28/14  Yes Romero Belling, MD  omeprazole (PRILOSEC) 40 MG capsule Take 1 capsule (40 mg total) by mouth daily. 03/22/14  Yes Daniel J Angiulli, PA-C  pravastatin (PRAVACHOL) 20 MG tablet Take 1 tablet (20 mg total) by mouth daily. 03/22/14  Yes Daniel J Angiulli, PA-C  solifenacin (VESICARE) 5 MG tablet Take 5 mg by mouth daily.   Yes Historical Provider,  MD  warfarin (COUMADIN) 5 MG tablet Take 5 mg by mouth daily.   Yes Historical Provider, MD   BP 124/59  Pulse 74  Temp(Src) 98.4 F (36.9 C) (Oral)  Resp 18  SpO2 98% Physical Exam  Nursing note and vitals reviewed. Constitutional: She is oriented to person, place, and time. She appears well-developed and well-nourished. No distress.  HENT:  Head: Normocephalic and atraumatic.  Eyes: EOM are normal.  Neck: Normal range of motion.  Cardiovascular: Normal rate, regular rhythm and normal heart sounds.   Pulmonary/Chest: Effort normal and breath sounds normal.  Abdominal: Soft. She exhibits no distension. There is no tenderness.  Musculoskeletal: Normal range of motion.  Neurological: She is alert and oriented to person,  place, and time.  Skin: Skin is warm and dry.  Psychiatric: She has a normal mood and affect. Judgment normal.    ED Course  Procedures (including critical care time) Labs Review Labs Reviewed  PROTIME-INR - Abnormal; Notable for the following:    Prothrombin Time 50.3 (*)    INR 5.54 (*)    All other components within normal limits  CBC - Abnormal; Notable for the following:    Hemoglobin 11.2 (*)    HCT 33.7 (*)    All other components within normal limits  CBG MONITORING, ED - Abnormal; Notable for the following:    Glucose-Capillary 62 (*)    All other components within normal limits  I-STAT CHEM 8, ED - Abnormal; Notable for the following:    Glucose, Bld 32 (*)    Hemoglobin 11.9 (*)    HCT 35.0 (*)    All other components within normal limits  CBG MONITORING, ED - Abnormal; Notable for the following:    Glucose-Capillary 133 (*)    All other components within normal limits  CBG MONITORING, ED    Imaging Review No results found.   EKG Interpretation None      MDM   Final diagnoses:  None    9:05 PM Patient was monitored in the emergency department for several hours.  She.  It took a while for her blood sugar to maintain stability.  Discharge home in good condition.  We'll decrease her Lantus from 48 to 44 units per day.  Close followup with her endocrinologist.  Supratherapeutic INR.  Patient told to hold her Coumadin for the next 2 days, then recheck with her Coumadin physicians    Lyanne Co, MD 05/31/14 2426  Lyanne Co, MD 05/31/14 2107

## 2014-05-31 NOTE — ED Notes (Signed)
Bed: WA08 Expected date:  Expected time:  Means of arrival:  Comments: EMS- hypoglycemia 

## 2014-05-31 NOTE — ED Notes (Signed)
Per ems called repeatedly on a daily basis to patient home for c/o hypoglycemia; today cbg 35 on arrival; pt sluggish; 24 g oral glucose given-- immediate improvement with alert and oriented x 4; pt did not want to come to ER--family request; took insulin at 1030 and didn't eat much breakfast; ambulatory for EMS-- 55 cbg en route; pt ate peanut butter/jelly en route; cbg on arrival 72 per EMS

## 2014-05-31 NOTE — Discharge Instructions (Signed)
Please hold your Coumadin for the next 2 days

## 2014-06-01 ENCOUNTER — Other Ambulatory Visit: Payer: Self-pay | Admitting: Family

## 2014-06-02 ENCOUNTER — Other Ambulatory Visit: Payer: Self-pay

## 2014-06-02 ENCOUNTER — Other Ambulatory Visit: Payer: Self-pay | Admitting: General Practice

## 2014-06-02 DIAGNOSIS — E1049 Type 1 diabetes mellitus with other diabetic neurological complication: Secondary | ICD-10-CM

## 2014-06-02 MED ORDER — WARFARIN SODIUM 5 MG PO TABS: ORAL_TABLET | ORAL | Status: DC

## 2014-06-04 ENCOUNTER — Other Ambulatory Visit: Payer: Self-pay

## 2014-06-04 MED ORDER — SOLIFENACIN SUCCINATE 5 MG PO TABS: 5.0000 mg | ORAL_TABLET | Freq: Every day | ORAL | Status: DC

## 2014-06-07 ENCOUNTER — Ambulatory Visit: Payer: Commercial Managed Care - HMO

## 2014-06-07 ENCOUNTER — Ambulatory Visit: Payer: Medicare HMO | Admitting: Family

## 2014-06-10 ENCOUNTER — Ambulatory Visit (HOSPITAL_BASED_OUTPATIENT_CLINIC_OR_DEPARTMENT_OTHER): Payer: Commercial Managed Care - HMO | Admitting: Physical Medicine & Rehabilitation

## 2014-06-10 ENCOUNTER — Encounter: Payer: Medicare HMO | Attending: Physical Medicine & Rehabilitation

## 2014-06-10 ENCOUNTER — Encounter: Payer: Self-pay | Admitting: Physical Medicine & Rehabilitation

## 2014-06-10 ENCOUNTER — Ambulatory Visit: Payer: Commercial Managed Care - HMO

## 2014-06-10 VITALS — BP 128/65 | HR 110 | Resp 16 | Ht 63.0 in | Wt 163.0 lb

## 2014-06-10 DIAGNOSIS — Z951 Presence of aortocoronary bypass graft: Secondary | ICD-10-CM | POA: Diagnosis not present

## 2014-06-10 DIAGNOSIS — Z954 Presence of other heart-valve replacement: Secondary | ICD-10-CM | POA: Diagnosis not present

## 2014-06-10 DIAGNOSIS — I69959 Hemiplegia and hemiparesis following unspecified cerebrovascular disease affecting unspecified side: Secondary | ICD-10-CM | POA: Insufficient documentation

## 2014-06-10 DIAGNOSIS — M069 Rheumatoid arthritis, unspecified: Secondary | ICD-10-CM | POA: Insufficient documentation

## 2014-06-10 DIAGNOSIS — I2510 Atherosclerotic heart disease of native coronary artery without angina pectoris: Secondary | ICD-10-CM | POA: Insufficient documentation

## 2014-06-10 DIAGNOSIS — I69920 Aphasia following unspecified cerebrovascular disease: Secondary | ICD-10-CM | POA: Insufficient documentation

## 2014-06-10 DIAGNOSIS — I10 Essential (primary) hypertension: Secondary | ICD-10-CM | POA: Diagnosis not present

## 2014-06-10 DIAGNOSIS — E119 Type 2 diabetes mellitus without complications: Secondary | ICD-10-CM | POA: Diagnosis not present

## 2014-06-10 DIAGNOSIS — E785 Hyperlipidemia, unspecified: Secondary | ICD-10-CM | POA: Diagnosis not present

## 2014-06-10 DIAGNOSIS — E7800 Pure hypercholesterolemia, unspecified: Secondary | ICD-10-CM | POA: Diagnosis not present

## 2014-06-10 DIAGNOSIS — G8110 Spastic hemiplegia affecting unspecified side: Secondary | ICD-10-CM

## 2014-06-10 NOTE — Patient Instructions (Signed)

## 2014-06-10 NOTE — Progress Notes (Signed)
Botox Injection for spasticity using needle EMG guidance  Dilution: 50 Units/ml Indication: Severe spasticity which interferes with ADL,mobility and/or  hygiene and is unresponsive to medication management and other conservative care Informed consent was obtained after describing risks and benefits of the procedure with the patient. This includes bleeding, bruising, infection, excessive weakness, or medication side effects. A REMS form is on file and signed. Needle: 27g 1" needle electrode Number of units per muscle    FDS50 Pronator teres 50 All injections were done after obtaining appropriate EMG activity and after negative drawback for blood. The patient tolerated the procedure well. Post procedure instructions were given. A followup appointment was made.

## 2014-06-11 ENCOUNTER — Encounter: Payer: Self-pay | Admitting: Family

## 2014-06-11 ENCOUNTER — Ambulatory Visit (INDEPENDENT_AMBULATORY_CARE_PROVIDER_SITE_OTHER): Payer: Commercial Managed Care - HMO | Admitting: Family

## 2014-06-11 VITALS — BP 102/62 | HR 105 | Wt 162.0 lb

## 2014-06-11 DIAGNOSIS — E1165 Type 2 diabetes mellitus with hyperglycemia: Principal | ICD-10-CM

## 2014-06-11 DIAGNOSIS — Z5181 Encounter for therapeutic drug level monitoring: Secondary | ICD-10-CM

## 2014-06-11 DIAGNOSIS — Z7901 Long term (current) use of anticoagulants: Secondary | ICD-10-CM

## 2014-06-11 DIAGNOSIS — E7800 Pure hypercholesterolemia, unspecified: Secondary | ICD-10-CM

## 2014-06-11 DIAGNOSIS — I10 Essential (primary) hypertension: Secondary | ICD-10-CM

## 2014-06-11 DIAGNOSIS — T50905A Adverse effect of unspecified drugs, medicaments and biological substances, initial encounter: Secondary | ICD-10-CM

## 2014-06-11 DIAGNOSIS — I359 Nonrheumatic aortic valve disorder, unspecified: Secondary | ICD-10-CM

## 2014-06-11 DIAGNOSIS — Z952 Presence of prosthetic heart valve: Secondary | ICD-10-CM

## 2014-06-11 DIAGNOSIS — IMO0001 Reserved for inherently not codable concepts without codable children: Secondary | ICD-10-CM

## 2014-06-11 DIAGNOSIS — Z954 Presence of other heart-valve replacement: Secondary | ICD-10-CM

## 2014-06-11 LAB — HEPATIC FUNCTION PANEL
ALBUMIN: 3.6 g/dL (ref 3.5–5.2)
ALK PHOS: 87 U/L (ref 39–117)
ALT: 14 U/L (ref 0–35)
AST: 24 U/L (ref 0–37)
Bilirubin, Direct: 0.1 mg/dL (ref 0.0–0.3)
TOTAL PROTEIN: 7 g/dL (ref 6.0–8.3)
Total Bilirubin: 0.3 mg/dL (ref 0.2–1.2)

## 2014-06-11 LAB — CBC WITH DIFFERENTIAL/PLATELET
BASOS PCT: 0.5 % (ref 0.0–3.0)
Basophils Absolute: 0 10*3/uL (ref 0.0–0.1)
EOS PCT: 1.1 % (ref 0.0–5.0)
Eosinophils Absolute: 0.1 10*3/uL (ref 0.0–0.7)
HCT: 36.8 % (ref 36.0–46.0)
Hemoglobin: 12.1 g/dL (ref 12.0–15.0)
Lymphocytes Relative: 21.4 % (ref 12.0–46.0)
Lymphs Abs: 1.7 10*3/uL (ref 0.7–4.0)
MCHC: 32.9 g/dL (ref 30.0–36.0)
MCV: 84.3 fl (ref 78.0–100.0)
MONO ABS: 0.3 10*3/uL (ref 0.1–1.0)
Monocytes Relative: 4 % (ref 3.0–12.0)
Neutro Abs: 5.8 10*3/uL (ref 1.4–7.7)
Neutrophils Relative %: 73 % (ref 43.0–77.0)
Platelets: 275 10*3/uL (ref 150.0–400.0)
RBC: 4.36 Mil/uL (ref 3.87–5.11)
RDW: 17 % — ABNORMAL HIGH (ref 11.5–15.5)
WBC: 8 10*3/uL (ref 4.0–10.5)

## 2014-06-11 LAB — BASIC METABOLIC PANEL
BUN: 12 mg/dL (ref 6–23)
CHLORIDE: 102 meq/L (ref 96–112)
CO2: 24 mEq/L (ref 19–32)
CREATININE: 1 mg/dL (ref 0.4–1.2)
Calcium: 9.6 mg/dL (ref 8.4–10.5)
GFR: 63.73 mL/min (ref >60.00)
Glucose, Bld: 230 mg/dL — ABNORMAL HIGH (ref 70–99)
Potassium: 4.1 mEq/L (ref 3.5–5.1)
Sodium: 136 mEq/L (ref 135–145)

## 2014-06-11 LAB — LIPID PANEL
CHOL/HDL RATIO: 2
Cholesterol: 146 mg/dL (ref 0–200)
HDL: 66.5 mg/dL (ref >39.00)
LDL CALC: 62 mg/dL (ref 0–99)
NonHDL: 79.5
TRIGLYCERIDES: 86 mg/dL (ref 0.0–149.0)
VLDL: 17.2 mg/dL (ref 0.0–40.0)

## 2014-06-11 LAB — POCT INR: INR: 1.9

## 2014-06-11 LAB — HEMOGLOBIN A1C: Hgb A1c MFr Bld: 10.4 % — ABNORMAL HIGH (ref 4.6–6.5)

## 2014-06-11 MED ORDER — TRAMADOL HCL 50 MG PO TABS: 50.0000 mg | ORAL_TABLET | Freq: Three times a day (TID) | ORAL | Status: DC | PRN

## 2014-06-11 NOTE — Progress Notes (Signed)
Pre visit review using our clinic review tool, if applicable. No additional management support is needed unless otherwise documented below in the visit note. 

## 2014-06-11 NOTE — Progress Notes (Signed)
Subjective:    Patient ID: Madison [name], female    DOB: 1961/03/09, 54 y.o.   MRN: [id number]  HPI 53 year old white female, nonsmoker, with a history of type 2 diabetes-uncontrolled, CVA, hypertension, hyperlipidemia, aortic valve replacement is in today for recheck. She's currently taking Coumadin 5 mg once daily and tolerating it well. Is requesting to go back to speech therapy. Her some Lasix which is worsening. Continues to have left-sided paresthesia is significantly improved since her CVA. She's undergone Botox injections to her left arm. Ambulates without assistance now. Reports having increased stress at home. Her son and his girlfriend have moved into her home and aren't working. Reports her son being disrespectful and cursing her out at times. No physical abuse.    Review of Systems  Constitutional: Negative.   HENT: Negative.   Respiratory: Negative.   Cardiovascular: Negative.   Gastrointestinal: Negative.   Endocrine: Negative.   Genitourinary: Negative.   Musculoskeletal: Negative.   Skin: Negative.   Neurological: Negative for dizziness and tremors.       Left-sided paresthesia  Hematological: Negative.   Psychiatric/Behavioral: Negative for sleep disturbance. The patient is nervous/anxious.        Increased stress   Past Medical History  Diagnosis Date  . CAD (coronary artery disease)   . Obesity   . Hypercholesteremia   . HTN (hypertension)   . Dyslipidemia   . Stroke syndrome   . Aortic stenosis   . Thrombophlebitis   . Diabetes   . Hyperlipidemia   . Rheumatoid arthritis(714.0)     History   Social History  . Marital Status: Single    Spouse Name: N/A    Number of Children: N/A  . Years of Education: N/A   Occupational History  . Not on file.   Social History Main Topics  . Smoking status: Never Smoker   . Smokeless tobacco: Not on file  . Alcohol Use: No  . Drug Use: No  . Sexual Activity: Not on file   Other Topics Concern  . Not on  file   Social History Narrative  . No narrative on file    Past Surgical History  Procedure Laterality Date  . Cesarean section    . Foot fracture surgery    . Knee surgery    . Neuroplasty / transposition median nerve at carpal tunnel      Family History  Problem Relation Age of Onset  . Stroke      Allergies  Allergen Reactions  . Celebrex [Celecoxib] Rash  . Detrol [Tolterodine] Hives    Current Outpatient Prescriptions on File Prior to Visit  Medication Sig Dispense Refill  . aspirin 81 MG tablet Take 1 tablet (81 mg total) by mouth daily.  30 tablet    . baclofen (LIORESAL) 10 MG tablet Take 1 tablet (10 mg total) by mouth 2 (two) times daily.  60 each  0  . escitalopram (LEXAPRO) 5 MG tablet Take 1 tablet (5 mg total) by mouth daily.  30 tablet  1  . Insulin Glargine (LANTUS) 100 UNIT/ML Solostar Pen Inject 44 Units into the skin every morning. And pen needles 1/day  15 mL  0  . omeprazole (PRILOSEC) 40 MG capsule Take 1 capsule (40 mg total) by mouth daily.  90 capsule  3  . pravastatin (PRAVACHOL) 20 MG tablet TAKE 1 TABLET EVERY DAY  90 tablet  0  . solifenacin (VESICARE) 5 MG tablet Take 1 tablet (5 mg total) by mouth  daily.  90 tablet  0  . warfarin (COUMADIN) 5 MG tablet Take as directed by anticoagulation clinic  90 tablet  0   No current facility-administered medications on file prior to visit.    BP 102/62  Pulse 105  Wt 162 lb (73.483 kg)chart    Objective:   Physical Exam  Constitutional: She is oriented to person, place, and time. She appears well-developed and well-nourished.  HENT:  Right Ear: External ear normal.  Left Ear: External ear normal.  Nose: Nose normal.  Mouth/Throat: Oropharynx is clear and moist.  Neck: Normal range of motion. Neck supple.  Cardiovascular: Normal rate, regular rhythm and normal heart sounds.   Pulmonary/Chest: Effort normal and breath sounds normal.  Abdominal: Soft. Bowel sounds are normal.  Musculoskeletal:  Normal range of motion.  Neurological: She is alert and oriented to person, place, and time. She has normal reflexes.  Skin: Skin is warm and dry.  Psychiatric: She has a normal mood and affect.          Assessment & Plan:  Chastidy was seen today for follow-up.  Diagnoses and associated orders for this visit:  Medication reaction, initial encounter - traMADol (ULTRAM) 50 MG tablet; Take 1-2 tablets (50-100 mg total) by mouth every 8 (eight) hours as needed.  Long term (current) use of anticoagulants - traMADol (ULTRAM) 50 MG tablet; Take 1-2 tablets (50-100 mg total) by mouth every 8 (eight) hours as needed. - Hepatic Function Panel - CBC with Differential  Aortic valve disorders - traMADol (ULTRAM) 50 MG tablet; Take 1-2 tablets (50-100 mg total) by mouth every 8 (eight) hours as needed.  S/P AVR - traMADol (ULTRAM) 50 MG tablet; Take 1-2 tablets (50-100 mg total) by mouth every 8 (eight) hours as needed.  Type II or unspecified type diabetes mellitus without mention of complication, uncontrolled - Basic Metabolic Panel - Hemoglobin A1c  Pure hypercholesterolemia - Lipid Panel - Hepatic Function Panel - Basic Metabolic Panel  Encounter for therapeutic drug monitoring - CBC with Differential  Other Orders - POCT INR   Call the office with any questions or concerns. Recheck in 2 weeks for INR. OV in 3 months.

## 2014-06-11 NOTE — Patient Instructions (Addendum)
Take an extra 1/2 tablet today.  Then increase dosage and take 1 tablet daily except 1 1/2 on Wednesdays (7.57m). Recheck in 2 weeks.   Anticoagulation Dose Instructions as of 06/11/2014     SDorene GrebeTue Wed Thu Fri Sat   New Dose 5 mg 5 mg 5 mg 7.5 mg 5 mg 5 mg 5 mg    Description       Take an extra 1/2 tablet today.  Then increase dosage and take 1 tablet daily except 1 1/2 on Wednesdays (7.571m. Recheck in 2 weeks.       Diabetes and Standards of Medical Care Diabetes is complicated. You may find that your diabetes team includes a dietitian, nurse, diabetes educator, eye doctor, and more. To help everyone know what is going on and to help you get the care you deserve, the following schedule of care was developed to help keep you on track. Below are the tests, exams, vaccines, medicines, education, and plans you will need. HbA1c test This test shows how well you have controlled your glucose over the past 2-3 months. It is used to see if your diabetes management plan needs to be adjusted.   It is performed at least 2 times a year if you are meeting treatment goals.  It is performed 4 times a year if therapy has changed or if you are not meeting treatment goals. Blood pressure test  This test is performed at every routine medical visit. The goal is less than 140/90 mm Hg for most people, but 130/80 mm Hg in some cases. Ask your health care provider about your goal. Dental exam  Follow up with the dentist regularly. Eye exam  If you are diagnosed with type 1 diabetes as a child, get an exam upon reaching the age of 1011ears or older and have had diabetes for 3-5 years. Yearly eye exams are recommended after that initial eye exam.  If you are diagnosed with type 1 diabetes as an adult, get an exam within 5 years of diagnosis and then yearly.  If you are diagnosed with type 2 diabetes, get an exam as soon as possible after the diagnosis and then yearly. Foot care exam  Visual foot  exams are performed at every routine medical visit. The exams check for cuts, injuries, or other problems with the feet.  A comprehensive foot exam should be done yearly. This includes visual inspection as well as assessing foot pulses and testing for loss of sensation.  Check your feet nightly for cuts, injuries, or other problems with your feet. Tell your health care provider if anything is not healing. Kidney function test (urine microalbumin)  This test is performed once a year.  Type 1 diabetes: The first test is performed 5 years after diagnosis.  Type 2 diabetes: The first test is performed at the time of diagnosis.  A serum creatinine and estimated glomerular filtration rate (eGFR) test is done once a year to assess the level of chronic kidney disease (CKD), if present. Lipid profile (cholesterol, HDL, LDL, triglycerides)  Performed every 5 years for most people.  The goal for LDL is less than 100 mg/dL. If you are at high risk, the goal is less than 70 mg/dL.  The goal for HDL is 40 mg/dL-50 mg/dL for men and 50 mg/dL-60 mg/dL for women. An HDL cholesterol of 60 mg/dL or higher gives some protection against heart disease.  The goal for triglycerides is less than 150 mg/dL. Influenza vaccine, pneumococcal  vaccine, and hepatitis B vaccine  The influenza vaccine is recommended yearly.  It is recommended that people with diabetes who are over 36 years old get the pneumonia vaccine. In some cases, two separate shots may be given. Ask your health care provider if your pneumonia vaccination is up to date.  The hepatitis B vaccine is also recommended for adults with diabetes. Diabetes self-management education  Education is recommended at diagnosis and ongoing as needed. Treatment plan  Your treatment plan is reviewed at every medical visit. Document Released: 09/02/2009 Document Revised: 03/22/2014 Document Reviewed: 04/07/2013 [facility] Patient Information 2015 [place], [place].  This information is not intended to replace advice given to you by your health care provider. Make sure you discuss any questions you have with your health care provider.

## 2014-06-14 ENCOUNTER — Telehealth: Payer: Self-pay | Admitting: Family

## 2014-06-14 NOTE — Telephone Encounter (Signed)
Pt is needing directions on her coumadin meds.

## 2014-06-15 NOTE — Telephone Encounter (Signed)
Pt is aware of coumadin dose

## 2014-06-24 ENCOUNTER — Ambulatory Visit: Payer: Commercial Managed Care - HMO | Admitting: Family

## 2014-06-30 ENCOUNTER — Ambulatory Visit: Payer: Commercial Managed Care - HMO | Admitting: Family

## 2014-07-07 ENCOUNTER — Telehealth: Payer: Self-pay | Admitting: Family

## 2014-07-07 MED ORDER — GLUCOSE BLOOD VI STRP: ORAL_STRIP | Status: DC

## 2014-07-07 NOTE — Telephone Encounter (Signed)
Done

## 2014-07-07 NOTE — Telephone Encounter (Signed)
Pt needs [facility] test strips #100 W/REFILLS

## 2014-07-09 ENCOUNTER — Telehealth: Payer: Self-pay

## 2014-07-09 NOTE — Telephone Encounter (Signed)
Pt aware [facility] does not carry Em[facility] meters or strips. She is currently using someone else's meter and will order that meter herself

## 2014-07-12 ENCOUNTER — Telehealth: Payer: Self-pay | Admitting: Endocrinology

## 2014-07-12 NOTE — Telephone Encounter (Signed)
Pt changed her mind on message

## 2014-07-16 ENCOUNTER — Telehealth: Payer: Self-pay | Admitting: Endocrinology

## 2014-07-16 MED ORDER — GLUCOSE BLOOD VI STRP: ORAL_STRIP | Status: DC

## 2014-07-16 NOTE — Telephone Encounter (Signed)
Patient would like her test strips refilled  She is out  [facility]   Thank you

## 2014-07-16 NOTE — Telephone Encounter (Signed)
Rx sent per pt's request.  

## 2014-07-20 ENCOUNTER — Ambulatory Visit (INDEPENDENT_AMBULATORY_CARE_PROVIDER_SITE_OTHER): Payer: Commercial Managed Care - HMO | Admitting: Endocrinology

## 2014-07-20 ENCOUNTER — Ambulatory Visit: Payer: Commercial Managed Care - HMO | Admitting: Physical Medicine & Rehabilitation

## 2014-07-20 ENCOUNTER — Encounter (HOSPITAL_COMMUNITY): Payer: Self-pay | Admitting: Emergency Medicine

## 2014-07-20 ENCOUNTER — Emergency Department (HOSPITAL_COMMUNITY): Payer: Medicare HMO

## 2014-07-20 ENCOUNTER — Encounter: Payer: Self-pay | Admitting: Endocrinology

## 2014-07-20 ENCOUNTER — Encounter: Payer: Medicare HMO | Attending: Physical Medicine & Rehabilitation

## 2014-07-20 ENCOUNTER — Emergency Department (HOSPITAL_COMMUNITY)
Admission: EM | Admit: 2014-07-20 | Discharge: 2014-07-20 | Disposition: A | Discharge status: Home / Self Care | Payer: Medicare HMO | Attending: Emergency Medicine | Admitting: Emergency Medicine

## 2014-07-20 VITALS — BP 122/84 | HR 99 | Temp 98.7°F | Ht 63.0 in | Wt 164.0 lb

## 2014-07-20 DIAGNOSIS — M069 Rheumatoid arthritis, unspecified: Secondary | ICD-10-CM | POA: Insufficient documentation

## 2014-07-20 DIAGNOSIS — L03116 Cellulitis of left lower limb: Secondary | ICD-10-CM

## 2014-07-20 DIAGNOSIS — Z954 Presence of other heart-valve replacement: Secondary | ICD-10-CM | POA: Insufficient documentation

## 2014-07-20 DIAGNOSIS — Y9289 Other specified places as the place of occurrence of the external cause: Secondary | ICD-10-CM | POA: Diagnosis not present

## 2014-07-20 DIAGNOSIS — Z7901 Long term (current) use of anticoagulants: Secondary | ICD-10-CM | POA: Diagnosis not present

## 2014-07-20 DIAGNOSIS — E785 Hyperlipidemia, unspecified: Secondary | ICD-10-CM | POA: Diagnosis not present

## 2014-07-20 DIAGNOSIS — Z9889 Other specified postprocedural states: Secondary | ICD-10-CM | POA: Diagnosis not present

## 2014-07-20 DIAGNOSIS — I10 Essential (primary) hypertension: Secondary | ICD-10-CM | POA: Insufficient documentation

## 2014-07-20 DIAGNOSIS — E7800 Pure hypercholesterolemia, unspecified: Secondary | ICD-10-CM | POA: Insufficient documentation

## 2014-07-20 DIAGNOSIS — IMO0002 Reserved for concepts with insufficient information to code with codable children: Secondary | ICD-10-CM | POA: Diagnosis not present

## 2014-07-20 DIAGNOSIS — Y9389 Activity, other specified: Secondary | ICD-10-CM | POA: Insufficient documentation

## 2014-07-20 DIAGNOSIS — L02619 Cutaneous abscess of unspecified foot: Secondary | ICD-10-CM | POA: Diagnosis not present

## 2014-07-20 DIAGNOSIS — I69920 Aphasia following unspecified cerebrovascular disease: Secondary | ICD-10-CM | POA: Insufficient documentation

## 2014-07-20 DIAGNOSIS — Z794 Long term (current) use of insulin: Secondary | ICD-10-CM | POA: Insufficient documentation

## 2014-07-20 DIAGNOSIS — S99922A Unspecified injury of left foot, initial encounter: Secondary | ICD-10-CM

## 2014-07-20 DIAGNOSIS — E119 Type 2 diabetes mellitus without complications: Secondary | ICD-10-CM

## 2014-07-20 DIAGNOSIS — Z7982 Long term (current) use of aspirin: Secondary | ICD-10-CM | POA: Insufficient documentation

## 2014-07-20 DIAGNOSIS — E669 Obesity, unspecified: Secondary | ICD-10-CM | POA: Insufficient documentation

## 2014-07-20 DIAGNOSIS — S99919A Unspecified injury of unspecified ankle, initial encounter: Secondary | ICD-10-CM | POA: Diagnosis present

## 2014-07-20 DIAGNOSIS — S99929A Unspecified injury of unspecified foot, initial encounter: Secondary | ICD-10-CM

## 2014-07-20 DIAGNOSIS — Z79899 Other long term (current) drug therapy: Secondary | ICD-10-CM | POA: Insufficient documentation

## 2014-07-20 DIAGNOSIS — S8990XA Unspecified injury of unspecified lower leg, initial encounter: Secondary | ICD-10-CM | POA: Insufficient documentation

## 2014-07-20 DIAGNOSIS — S9032XA Contusion of left foot, initial encounter: Secondary | ICD-10-CM | POA: Insufficient documentation

## 2014-07-20 DIAGNOSIS — S9030XA Contusion of unspecified foot, initial encounter: Secondary | ICD-10-CM

## 2014-07-20 DIAGNOSIS — I2510 Atherosclerotic heart disease of native coronary artery without angina pectoris: Secondary | ICD-10-CM | POA: Insufficient documentation

## 2014-07-20 DIAGNOSIS — L03119 Cellulitis of unspecified part of limb: Secondary | ICD-10-CM

## 2014-07-20 DIAGNOSIS — I69959 Hemiplegia and hemiparesis following unspecified cerebrovascular disease affecting unspecified side: Secondary | ICD-10-CM | POA: Insufficient documentation

## 2014-07-20 DIAGNOSIS — Z951 Presence of aortocoronary bypass graft: Secondary | ICD-10-CM | POA: Insufficient documentation

## 2014-07-20 MED ORDER — GLUCOSE BLOOD VI STRP: 1.0000 | ORAL_STRIP | Freq: Two times a day (BID) | Status: DC

## 2014-07-20 MED ORDER — INSULIN DETEMIR 100 UNIT/ML FLEXPEN: 50.0000 [IU] | PEN_INJECTOR | SUBCUTANEOUS | Status: DC

## 2014-07-20 MED ORDER — CEPHALEXIN 500 MG PO CAPS
500.0000 mg | ORAL_CAPSULE | Freq: Once | ORAL | Status: AC
  Administered 2014-07-20: 500 mg via ORAL
  Filled 2014-07-20: qty 1

## 2014-07-20 MED ORDER — CEPHALEXIN 500 MG PO CAPS: 500.0000 mg | ORAL_CAPSULE | Freq: Four times a day (QID) | ORAL | Status: DC

## 2014-07-20 NOTE — Patient Instructions (Addendum)
Here is a new meter.   i have sent a prescription to your pharmacy, for strips. check your blood sugar twice a day.  vary the time of day when you check, between before the 3 meals, and at bedtime.  also check if you have symptoms of your blood sugar being too high or too low.  please keep a record of the readings and bring it to your next appointment here.  You can write it on any piece of paper.  please call us sooner if your blood sugar goes below 70, or if you have a lot of readings over 200. X-rays are being requested for you today.  We'll contact you with results. Please change your insulin to "levemir," to get it to work more in the afternoon, and less in the middle of the night. Please come back for a follow-up appointment in 2 weeks.

## 2014-07-20 NOTE — ED Provider Notes (Signed)
CSN: [id number]     Arrival date & time 07/20/14  1726 History   This chart was scribed for Renne Crigler, PA-C working with Suzi Roots, MD by Evon Slack, ED Scribe. This patient was seen in room WTR5/WTR5 and the patient's care was started at 6:00 PM.   Chief Complaint  Patient presents with  . Foot Injury   The history is provided by the patient. No language interpreter was used.   HPI Comments: Madison [name] is a 53 y.o. female with a Hx of Diabetes, coumadin use who presents to the Emergency Department complaining of left foot injury onset 4 days prior. She states she hit her foot on the truck door when getting out. She states it is painful to walk. She states she is having associated swelling and bruising. She states she took some tramadol with some relief. Denies leg or knee pain.   Past Medical History  Diagnosis Date  . CAD (coronary artery disease)   . Obesity   . Hypercholesteremia   . HTN (hypertension)   . Dyslipidemia   . Stroke syndrome   . Aortic stenosis   . Thrombophlebitis   . Diabetes   . Hyperlipidemia   . Rheumatoid arthritis(714.0)    Past Surgical History  Procedure Laterality Date  . Cesarean section    . Foot fracture surgery    . Knee surgery    . Neuroplasty / transposition median nerve at carpal tunnel     Family History  Problem Relation Age of Onset  . Stroke     History  Substance Use Topics  . Smoking status: Never Smoker   . Smokeless tobacco: Not on file  . Alcohol Use: No   OB History   Grav Para Term Preterm Abortions TAB SAB Ect Mult Living                 Review of Systems  Constitutional: Negative for fever and activity change.  Gastrointestinal: Negative for nausea and vomiting.  Musculoskeletal: Positive for arthralgias, gait problem and joint swelling. Negative for back pain and neck pain.  Skin: Positive for color change. Negative for wound.  Neurological: Negative for weakness and numbness.      Allergies   Celebrex and Detrol  Home Medications   Prior to Admission medications   Medication Sig Start Date End Date Taking? Authorizing Provider  aspirin 81 MG tablet Take 1 tablet (81 mg total) by mouth daily. 03/22/14   Mcarthur Rossetti Angiulli, PA-C  baclofen (LIORESAL) 10 MG tablet Take 1 tablet (10 mg total) by mouth 2 (two) times daily. 03/22/14   Mcarthur Rossetti Angiulli, PA-C  escitalopram (LEXAPRO) 5 MG tablet Take 1 tablet (5 mg total) by mouth daily. 03/22/14   Mcarthur Rossetti Angiulli, PA-C  glucose blood [facility] BLOOD GLUCOSE TEST) test strip Use to check blood sugar twice daily 07/16/14   Romero Belling, MD  glucose blood (ONE TOUCH ULTRA TEST) test strip 1 each by Other route 2 (two) times daily. And lancets 2/day 250.01 07/20/14   Romero Belling, MD  Insulin Detemir (LEVEMIR FLEXTOUCH) 100 UNIT/ML Pen Inject 50 Units into the skin every morning. and pen needles 1/day 07/20/14   Romero Belling, MD  omeprazole (PRILOSEC) 40 MG capsule Take 1 capsule (40 mg total) by mouth daily. 03/22/14   Mcarthur Rossetti Angiulli, PA-C  pravastatin (PRAVACHOL) 20 MG tablet TAKE 1 TABLET EVERY DAY 06/01/14   Baker Pierini, FNP  solifenacin (VESICARE) 5 MG tablet Take 1 tablet (  5 mg total) by mouth daily. 06/04/14   Baker Pierini, FNP  traMADol (ULTRAM) 50 MG tablet Take 1-2 tablets (50-100 mg total) by mouth every 8 (eight) hours as needed. 06/11/14   Baker Pierini, FNP  warfarin (COUMADIN) 5 MG tablet Take as directed by anticoagulation clinic 06/02/14   Baker Pierini, FNP   Triage Vitals: BP 118/65  Pulse 80  Temp(Src) 98.5 F (36.9 C) (Oral)  SpO2 100%  Physical Exam  Nursing note and vitals reviewed. Constitutional: She appears well-developed and well-nourished.  HENT:  Head: Normocephalic and atraumatic.  Eyes: Pupils are equal, round, and reactive to light.  Neck: Normal range of motion. Neck supple.  Cardiovascular: Exam reveals no decreased pulses.   Pulses:      Dorsalis pedis pulses are 2+ on the right side, and  2+ on the left side.       Posterior tibial pulses are 2+ on the right side, and 2+ on the left side.  Musculoskeletal: She exhibits edema and tenderness.       Left hip: Normal.       Left knee: Normal.       Left ankle: Normal. No tenderness.       Left foot: She exhibits tenderness, bony tenderness and swelling. She exhibits normal range of motion and normal capillary refill.       Feet:  Neurological: She is alert. No sensory deficit.  Motor, sensation, and vascular distal to the injury is fully intact.   Skin: Skin is warm and dry.  Psychiatric: She has a normal mood and affect.        ED Course  Procedures (including critical care time) DIAGNOSTIC STUDIES: Oxygen Saturation is 100% on RA, normal by my interpretation.    COORDINATION OF CARE: 6:07 PM-Discussed treatment plan which includes left foot X-ray with pt at bedside and pt agreed to plan.     Labs Review Labs Reviewed - No data to display  Imaging Review Dg Foot Complete Left  07/20/2014   CLINICAL DATA:  Hit left foot after gating into a truck 1 week ago. Redness and swelling.  EXAM: LEFT FOOT - COMPLETE 3+ VIEW  COMPARISON:  None.  FINDINGS: The bones appear osteopenic. There is mild diffuse soft tissue swelling. Hammertoe deformities noted. No fracture or subluxation identified.  IMPRESSION: 1.  Soft tissue swelling.  2.  Hammer toes.   Electronically Signed   By: Signa Kell M.D.   On: 07/20/2014 18:54     EKG Interpretation None     Vital signs reviewed and are as follows: Filed Vitals:   07/20/14 1743  BP: 118/65  Pulse: 80  Temp: 98.5 F (36.9 C)   Pt urged to return with worsening pain, worsening swelling, expanding area of redness or streaking up extremity, fever, or any other concerns. Urged to take complete course of antibiotics as prescribed. Counseled to take home pain medications as prescribed. Pt verbalizes understanding and agrees with plan.   PCP f/u in 2-3 days -- patient states she  will do this.   MDM   Final diagnoses:  Cellulitis of left foot  Injury of left foot, initial encounter   Foot injury: x-ray neg  Patient has bruising however the erythematous and warmth noted over top of foot makes me concerned for cellulitis, especially in this patient with history of diabetes. Patient has PCP followup and will have area rechecked in 2-3 days.  I personally performed the services described in this  documentation, which was scribed in my presence. The recorded information has been reviewed and is accurate.     Renne Crigler, PA-C 07/20/14 1911

## 2014-07-20 NOTE — Progress Notes (Signed)
Subjective:    Patient ID: Madison [name], female    DOB: [birth year], 53 y.o.   MRN: [id number]  HPI pt returns for f/u of insulin-requiring DM  (dx'ed 1981, on a routine blood test; complicated by neuropathy of the lower extremities, retinopathy, CVA's, and CAD; she has been on insulin since dx; she has never had pancreatitis; last episode of DKA was in April of 2015; last episode of severe hypoglycemia was in 2014; she was on insulin pump rx until CVA's prevented her from using the device in early 2014; in early 2015, she was changed to qd insulin, due to poor results with multiple daily injections).  no cbg record, but states cbg's vary from 53-220.  It is in general higher as the day goes on. She reports 2 weeks of moderate pain at the left foot (in the context of a pipe falling on it), but no assoc numbness.  Past Medical History  Diagnosis Date  . CAD (coronary artery disease)   . Obesity   . Hypercholesteremia   . HTN (hypertension)   . Dyslipidemia   . Stroke syndrome   . Aortic stenosis   . Thrombophlebitis   . Diabetes   . Hyperlipidemia   . Rheumatoid arthritis(714.0)     Past Surgical History  Procedure Laterality Date  . Cesarean section    . Foot fracture surgery    . Knee surgery    . Neuroplasty / transposition median nerve at carpal tunnel      History   Social History  . Marital Status: Single    Spouse Name: N/A    Number of Children: N/A  . Years of Education: N/A   Occupational History  . Not on file.   Social History Main Topics  . Smoking status: Never Smoker   . Smokeless tobacco: Not on file  . Alcohol Use: No  . Drug Use: No  . Sexual Activity: Not on file   Other Topics Concern  . Not on file   Social History Narrative  . No narrative on file    Current Outpatient Prescriptions on File Prior to Visit  Medication Sig Dispense Refill  . aspirin 81 MG tablet Take 1 tablet (81 mg total) by mouth daily.  30 tablet    . baclofen (LIORESAL)  10 MG tablet Take 1 tablet (10 mg total) by mouth 2 (two) times daily.  60 each  0  . escitalopram (LEXAPRO) 5 MG tablet Take 1 tablet (5 mg total) by mouth daily.  30 tablet  1  . glucose blood (EMBRACE BLOOD GLUCOSE TEST) test strip Use to check blood sugar twice daily  200 each  3  . omeprazole (PRILOSEC) 40 MG capsule Take 1 capsule (40 mg total) by mouth daily.  90 capsule  3  . pravastatin (PRAVACHOL) 20 MG tablet TAKE 1 TABLET EVERY DAY  90 tablet  0  . solifenacin (VESICARE) 5 MG tablet Take 1 tablet (5 mg total) by mouth daily.  90 tablet  0  . traMADol (ULTRAM) 50 MG tablet Take 1-2 tablets (50-100 mg total) by mouth every 8 (eight) hours as needed.  90 tablet  0  . warfarin (COUMADIN) 5 MG tablet Take as directed by anticoagulation clinic  90 tablet  0   No current facility-administered medications on file prior to visit.    Allergies  Allergen Reactions  . Celebrex [Celecoxib] Rash  . Detrol [Tolterodine] Hives    Family History  Problem Relation Age of  Onset  . Stroke      BP 122/84  Pulse 99  Temp(Src) 98.7 F (37.1 C) (Oral)  Ht 5\' 3"  (1.6 m)  Wt 164 lb (74.39 kg)  BMI 29.06 kg/m2  SpO2 97%    Review of Systems Denies LOC and weight change.      Objective:   Physical Exam VITAL SIGNS:  See vs page GENERAL: no distress Pulses: dorsalis pedis intact bilat.  Feet: no deformity. feet are of normal temp. no edema.  At the dorsal aspect of the left foot, there is swelling and ecchymosis.   Skin: no ulcer on the feet.  Neuro: sensation is intact to touch on the feet, but decreased from normal   Lab Results  Component Value Date   HGBA1C 10.4* 06/11/2014      Assessment & Plan:  DM: moderate exacerbation.  Based on the pattern of her cbg's, she needs a faster-acting insulin.   Noncompliance with cbg recording: I'll work around this as best I can. Foot contusion, new.     Patient is advised the following: Patient Instructions  Here is a new meter.   i  have sent a prescription to your pharmacy, for strips. check your blood sugar twice a day.  vary the time of day when you check, between before the 3 meals, and at bedtime.  also check if you have symptoms of your blood sugar being too high or too low.  please keep a record of the readings and bring it to your next appointment here.  You can write it on any piece of paper.  please call 06/13/2014 sooner if your blood sugar goes below 70, or if you have a lot of readings over 200. X-rays are being requested for you today.  We'll contact you with results. Please change your insulin to "levemir," to get it to work more in the afternoon, and less in the middle of the night. Please come back for a follow-up appointment in 2 weeks.

## 2014-07-20 NOTE — ED Notes (Signed)
Pt states she hit her foot getting into a truck last week and foot is now very red and swollen. Sent in by PCP for eval. Alert and oriented.

## 2014-07-20 NOTE — Discharge Instructions (Signed)
Please read and follow all provided instructions.  Your diagnoses today include:  1. Cellulitis of left foot   2. Injury of left foot, initial encounter     Tests performed today include:  Vital signs. See below for your results today.   X-ray of foot - does not show any broken bones  Medications prescribed:   Keflex (cephalexin) - antibiotic  You have been prescribed an antibiotic medicine: take the entire course of medicine even if you are feeling better. Stopping early can cause the antibiotic not to work.  Take any prescribed medications only as directed.   Home care instructions:  Follow any educational materials contained in this packet. Keep affected area above the level of your heart when possible. Wash area gently twice a day with warm soapy water. Do not apply alcohol or hydrogen peroxide. Cover the area if it draining or weeping.   Follow-up instructions: See your doctor on Friday for a recheck.  Please follow-up with your primary care provider in the next 1 week for further evaluation of your symptoms.   Return instructions:  Return to the Emergency Department if you have:  Fever  Worsening symptoms  Worsening pain  Worsening swelling  Redness of the skin that moves away from the affected area, especially if it streaks away from the affected area   Any other emergent concerns  Your vital signs today were: BP 118/65   Pulse 80   Temp(Src) 98.5 F (36.9 C) (Oral)   SpO2 100% If your blood pressure (BP) was elevated above 135/85 this visit, please have this repeated by your doctor within one month. --------------

## 2014-07-27 NOTE — ED Provider Notes (Signed)
Medical screening examination/treatment/procedure(s) were performed by non-physician practitioner and as supervising physician I was immediately available for consultation/collaboration.     Suzi Roots, MD 07/27/14 [phone]

## 2014-08-03 ENCOUNTER — Telehealth: Payer: Self-pay | Admitting: Endocrinology

## 2014-08-03 NOTE — Telephone Encounter (Signed)
Called pt back. Pt wanted to report blood sugars. Pt states that since changing her Levemir to 50 units her readings have been in the 130s and lower. Pt states the only high reading she has had is 157. Pt had to cancel appointment for 9/16 due to lack of transportation. She is going to call back and reschedule appointment once she can work out transportation.

## 2014-08-03 NOTE — Telephone Encounter (Signed)
Patient is returing your call °

## 2014-08-03 NOTE — Telephone Encounter (Signed)
Please continue the same insulin.  

## 2014-08-04 ENCOUNTER — Other Ambulatory Visit: Payer: Self-pay | Admitting: Family

## 2014-08-04 ENCOUNTER — Ambulatory Visit: Payer: Commercial Managed Care - HMO | Admitting: Endocrinology

## 2014-08-05 NOTE — Telephone Encounter (Signed)
°  Patient need refill of test strips for one touch ultra mini. She stated that she needed them today if possible. Only have one left

## 2014-08-06 ENCOUNTER — Telehealth: Payer: Self-pay | Admitting: Endocrinology

## 2014-08-06 MED ORDER — GLUCOSE BLOOD VI STRP: ORAL_STRIP | Status: DC

## 2014-08-06 MED ORDER — GLUCOSE BLOOD VI STRP: 1.0000 | ORAL_STRIP | Freq: Two times a day (BID) | Status: DC

## 2014-08-06 NOTE — Telephone Encounter (Signed)
i have sent a prescription to your pharmacy F/u ov is due

## 2014-08-06 NOTE — Telephone Encounter (Signed)
Rx refilled per pt's request.  

## 2014-08-06 NOTE — Telephone Encounter (Signed)
Called pt. Test strips are covered but the pt has been testing more than 2 times per day. Pt states that she tests on average 5 times per day. Pt wanted to know if we could increase testing frequency. Please advise, Thanks!

## 2014-08-06 NOTE — Telephone Encounter (Signed)
Pt advised that rx for blood testing trips had been sent. Pt stated she would call back and schedule ov.

## 2014-08-06 NOTE — Telephone Encounter (Signed)
Patient states that her insurance is not paying for her test strips  One touch mini Do you we have any test strip samples? Is there another meter she can use?  Thank You

## 2014-08-23 ENCOUNTER — Other Ambulatory Visit: Payer: Self-pay

## 2014-08-23 MED ORDER — INSULIN DETEMIR 100 UNIT/ML FLEXPEN: 50.0000 [IU] | PEN_INJECTOR | SUBCUTANEOUS | Status: DC

## 2014-08-26 ENCOUNTER — Telehealth: Payer: Self-pay | Admitting: Family

## 2014-08-26 NOTE — Telephone Encounter (Signed)
Please call and schedule PT/INR for patient. She is overdue

## 2014-08-27 NOTE — Telephone Encounter (Signed)
lmovm to c/b and schedule PT/INR

## 2014-09-10 ENCOUNTER — Ambulatory Visit: Payer: Commercial Managed Care - HMO | Admitting: Family

## 2014-09-21 NOTE — Telephone Encounter (Signed)
Per [employer], patient specimen was never received.

## 2014-09-28 NOTE — Telephone Encounter (Signed)
Please follow-up with patient and schedule

## 2014-09-29 NOTE — Telephone Encounter (Signed)
Pt has been scheduled.  °

## 2014-09-29 NOTE — Telephone Encounter (Signed)
Noted  

## 2014-09-30 ENCOUNTER — Encounter (HOSPITAL_COMMUNITY): Payer: Self-pay | Admitting: Emergency Medicine

## 2014-09-30 ENCOUNTER — Emergency Department (HOSPITAL_COMMUNITY): Payer: Medicare HMO

## 2014-09-30 ENCOUNTER — Inpatient Hospital Stay (HOSPITAL_COMMUNITY)
Admission: EM | Admit: 2014-09-30 | Discharge: 2014-10-04 | DRG: 637 | Disposition: A | Discharge status: Home / Self Care | Payer: Medicare HMO | Attending: Internal Medicine | Admitting: Internal Medicine

## 2014-09-30 DIAGNOSIS — Z888 Allergy status to other drugs, medicaments and biological substances status: Secondary | ICD-10-CM | POA: Diagnosis not present

## 2014-09-30 DIAGNOSIS — Z794 Long term (current) use of insulin: Secondary | ICD-10-CM | POA: Diagnosis not present

## 2014-09-30 DIAGNOSIS — E1310 Other specified diabetes mellitus with ketoacidosis without coma: Secondary | ICD-10-CM | POA: Diagnosis present

## 2014-09-30 DIAGNOSIS — M069 Rheumatoid arthritis, unspecified: Secondary | ICD-10-CM | POA: Diagnosis not present

## 2014-09-30 DIAGNOSIS — E869 Volume depletion, unspecified: Secondary | ICD-10-CM | POA: Diagnosis not present

## 2014-09-30 DIAGNOSIS — K210 Gastro-esophageal reflux disease with esophagitis: Secondary | ICD-10-CM | POA: Diagnosis not present

## 2014-09-30 DIAGNOSIS — E873 Alkalosis: Secondary | ICD-10-CM | POA: Diagnosis present

## 2014-09-30 DIAGNOSIS — D62 Acute posthemorrhagic anemia: Secondary | ICD-10-CM | POA: Diagnosis present

## 2014-09-30 DIAGNOSIS — E1010 Type 1 diabetes mellitus with ketoacidosis without coma: Secondary | ICD-10-CM

## 2014-09-30 DIAGNOSIS — R4182 Altered mental status, unspecified: Secondary | ICD-10-CM

## 2014-09-30 DIAGNOSIS — Z6829 Body mass index (BMI) 29.0-29.9, adult: Secondary | ICD-10-CM

## 2014-09-30 DIAGNOSIS — Z7982 Long term (current) use of aspirin: Secondary | ICD-10-CM | POA: Diagnosis not present

## 2014-09-30 DIAGNOSIS — R404 Transient alteration of awareness: Secondary | ICD-10-CM

## 2014-09-30 DIAGNOSIS — R40244 Other coma, without documented Glasgow coma scale score, or with partial score reported: Secondary | ICD-10-CM

## 2014-09-30 DIAGNOSIS — Z8673 Personal history of transient ischemic attack (TIA), and cerebral infarction without residual deficits: Secondary | ICD-10-CM | POA: Diagnosis not present

## 2014-09-30 DIAGNOSIS — R630 Anorexia: Secondary | ICD-10-CM | POA: Diagnosis present

## 2014-09-30 DIAGNOSIS — G9340 Encephalopathy, unspecified: Secondary | ICD-10-CM | POA: Diagnosis not present

## 2014-09-30 DIAGNOSIS — R197 Diarrhea, unspecified: Secondary | ICD-10-CM | POA: Diagnosis not present

## 2014-09-30 DIAGNOSIS — R000 Tachycardia, unspecified: Secondary | ICD-10-CM | POA: Diagnosis not present

## 2014-09-30 DIAGNOSIS — Z9119 Patient's noncompliance with other medical treatment and regimen: Secondary | ICD-10-CM | POA: Diagnosis present

## 2014-09-30 DIAGNOSIS — G9341 Metabolic encephalopathy: Secondary | ICD-10-CM | POA: Diagnosis not present

## 2014-09-30 DIAGNOSIS — I959 Hypotension, unspecified: Secondary | ICD-10-CM

## 2014-09-30 DIAGNOSIS — I10 Essential (primary) hypertension: Secondary | ICD-10-CM | POA: Diagnosis present

## 2014-09-30 DIAGNOSIS — E11649 Type 2 diabetes mellitus with hypoglycemia without coma: Secondary | ICD-10-CM | POA: Diagnosis not present

## 2014-09-30 DIAGNOSIS — I2510 Atherosclerotic heart disease of native coronary artery without angina pectoris: Secondary | ICD-10-CM | POA: Diagnosis not present

## 2014-09-30 DIAGNOSIS — T17908A Unspecified foreign body in respiratory tract, part unspecified causing other injury, initial encounter: Secondary | ICD-10-CM

## 2014-09-30 DIAGNOSIS — E669 Obesity, unspecified: Secondary | ICD-10-CM | POA: Diagnosis not present

## 2014-09-30 DIAGNOSIS — G629 Polyneuropathy, unspecified: Secondary | ICD-10-CM | POA: Diagnosis not present

## 2014-09-30 DIAGNOSIS — E872 Acidosis: Secondary | ICD-10-CM | POA: Diagnosis not present

## 2014-09-30 DIAGNOSIS — K922 Gastrointestinal hemorrhage, unspecified: Secondary | ICD-10-CM | POA: Diagnosis present

## 2014-09-30 DIAGNOSIS — E1110 Type 2 diabetes mellitus with ketoacidosis without coma: Secondary | ICD-10-CM | POA: Diagnosis present

## 2014-09-30 DIAGNOSIS — E785 Hyperlipidemia, unspecified: Secondary | ICD-10-CM | POA: Diagnosis present

## 2014-09-30 DIAGNOSIS — Z7901 Long term (current) use of anticoagulants: Secondary | ICD-10-CM | POA: Diagnosis not present

## 2014-09-30 DIAGNOSIS — I471 Supraventricular tachycardia: Secondary | ICD-10-CM

## 2014-09-30 DIAGNOSIS — D72829 Elevated white blood cell count, unspecified: Secondary | ICD-10-CM | POA: Diagnosis present

## 2014-09-30 DIAGNOSIS — W19XXXA Unspecified fall, initial encounter: Secondary | ICD-10-CM

## 2014-09-30 DIAGNOSIS — R031 Nonspecific low blood-pressure reading: Secondary | ICD-10-CM

## 2014-09-30 DIAGNOSIS — Z23 Encounter for immunization: Secondary | ICD-10-CM

## 2014-09-30 DIAGNOSIS — E875 Hyperkalemia: Secondary | ICD-10-CM | POA: Diagnosis not present

## 2014-09-30 DIAGNOSIS — Z952 Presence of prosthetic heart valve: Secondary | ICD-10-CM | POA: Diagnosis not present

## 2014-09-30 DIAGNOSIS — R1314 Dysphagia, pharyngoesophageal phase: Secondary | ICD-10-CM

## 2014-09-30 LAB — BASIC METABOLIC PANEL
Anion gap: 31 — ABNORMAL HIGH (ref 5–15)
Anion gap: 24 — ABNORMAL HIGH (ref 5–15)
Anion gap: 22 — ABNORMAL HIGH (ref 5–15)
BUN: 25 mg/dL — ABNORMAL HIGH (ref 6–23)
BUN: 24 mg/dL — AB (ref 6–23)
BUN: 24 mg/dL — AB (ref 6–23)
CO2: 9 meq/L — AB (ref 19–32)
CO2: 12 meq/L — AB (ref 19–32)
CO2: 12 meq/L — AB (ref 19–32)
Calcium: 9.4 mg/dL (ref 8.4–10.5)
Calcium: 8.8 mg/dL (ref 8.4–10.5)
Calcium: 8.8 mg/dL (ref 8.4–10.5)
Chloride: 106 mEq/L (ref 96–112)
Chloride: 108 mEq/L (ref 96–112)
Chloride: 109 mEq/L (ref 96–112)
Creatinine, Ser: 1.23 mg/dL — ABNORMAL HIGH (ref 0.50–1.10)
Creatinine, Ser: 1.14 mg/dL — ABNORMAL HIGH (ref 0.50–1.10)
Creatinine, Ser: 1.08 mg/dL (ref 0.50–1.10)
GFR calc Af Amer: 57 mL/min — ABNORMAL LOW (ref >90)
GFR calc Af Amer: 62 mL/min — ABNORMAL LOW (ref >90)
GFR calc Af Amer: 67 mL/min — ABNORMAL LOW (ref >90)
GFR calc non Af Amer: 54 mL/min — ABNORMAL LOW (ref >90)
GFR calc non Af Amer: 58 mL/min — ABNORMAL LOW (ref >90)
GFR, EST NON AFRICAN AMERICAN: 49 mL/min — AB (ref >90)
GLUCOSE: 415 mg/dL — AB (ref 70–99)
GLUCOSE: 362 mg/dL — AB (ref 70–99)
GLUCOSE: 347 mg/dL — AB (ref 70–99)
POTASSIUM: 4.4 meq/L (ref 3.7–5.3)
POTASSIUM: 5 meq/L (ref 3.7–5.3)
Potassium: 5 mEq/L (ref 3.7–5.3)
Sodium: 146 mEq/L (ref 137–147)
Sodium: 144 mEq/L (ref 137–147)
Sodium: 143 mEq/L (ref 137–147)

## 2014-09-30 LAB — CBC
HCT: 32 % — ABNORMAL LOW (ref 36.0–46.0)
HEMATOCRIT: 34 % — AB (ref 36.0–46.0)
HEMOGLOBIN: 11.3 g/dL — AB (ref 12.0–15.0)
HEMOGLOBIN: 10.7 g/dL — AB (ref 12.0–15.0)
MCH: 27.8 pg (ref 26.0–34.0)
MCH: 27.8 pg (ref 26.0–34.0)
MCHC: 33.2 g/dL (ref 30.0–36.0)
MCHC: 33.4 g/dL (ref 30.0–36.0)
MCV: 83.5 fL (ref 78.0–100.0)
MCV: 83.1 fL (ref 78.0–100.0)
Platelets: 264 10*3/uL (ref 150–400)
Platelets: 243 10*3/uL (ref 150–400)
RBC: 4.07 MIL/uL (ref 3.87–5.11)
RBC: 3.85 MIL/uL — AB (ref 3.87–5.11)
RDW: 14.8 % (ref 11.5–15.5)
RDW: 14.9 % (ref 11.5–15.5)
WBC: 27 10*3/uL — AB (ref 4.0–10.5)
WBC: 20.4 10*3/uL — ABNORMAL HIGH (ref 4.0–10.5)

## 2014-09-30 LAB — I-STAT CHEM 8, ED
BUN: 23 mg/dL (ref 6–23)
CALCIUM ION: 1.19 mmol/L (ref 1.12–1.23)
Chloride: 100 mEq/L (ref 96–112)
Creatinine, Ser: 1.1 mg/dL (ref 0.50–1.10)
Glucose, Bld: >700 mg/dL (ref 70–99)
HEMATOCRIT: 42 % (ref 36.0–46.0)
Hemoglobin: 14.3 g/dL (ref 12.0–15.0)
Potassium: 5.5 mEq/L — ABNORMAL HIGH (ref 3.7–5.3)
Sodium: 131 mEq/L — ABNORMAL LOW (ref 137–147)
TCO2: 10 mmol/L (ref 0–100)

## 2014-09-30 LAB — CBC WITH DIFFERENTIAL/PLATELET
BASOS PCT: 0 % (ref 0–1)
Band Neutrophils: 20 % — ABNORMAL HIGH (ref 0–10)
Basophils Absolute: 0 10*3/uL (ref 0.0–0.1)
Blasts: 0 %
EOS ABS: 0 10*3/uL (ref 0.0–0.7)
Eosinophils Relative: 0 % (ref 0–5)
HEMATOCRIT: 35.1 % — AB (ref 36.0–46.0)
HEMOGLOBIN: 11.2 g/dL — AB (ref 12.0–15.0)
Lymphocytes Relative: 10 % — ABNORMAL LOW (ref 12–46)
Lymphs Abs: 2.3 10*3/uL (ref 0.7–4.0)
MCH: 27.6 pg (ref 26.0–34.0)
MCHC: 31.9 g/dL (ref 30.0–36.0)
MCV: 86.5 fL (ref 78.0–100.0)
METAMYELOCYTES PCT: 0 %
Monocytes Absolute: 1.4 10*3/uL — ABNORMAL HIGH (ref 0.1–1.0)
Monocytes Relative: 6 % (ref 3–12)
Myelocytes: 0 %
Neutro Abs: 19.4 10*3/uL — ABNORMAL HIGH (ref 1.7–7.7)
Neutrophils Relative %: 64 % (ref 43–77)
Platelets: 286 10*3/uL (ref 150–400)
Promyelocytes Absolute: 0 %
RBC: 4.06 MIL/uL (ref 3.87–5.11)
RDW: 15 % (ref 11.5–15.5)
WBC MORPHOLOGY: INCREASED
WBC: 23.1 10*3/uL — AB (ref 4.0–10.5)
nRBC: 0 /100 WBC

## 2014-09-30 LAB — I-STAT VENOUS BLOOD GAS, ED
Acid-base deficit: 17 mmol/L — ABNORMAL HIGH (ref 0.0–2.0)
BICARBONATE: 8.3 meq/L — AB (ref 20.0–24.0)
O2 SAT: 99 %
PCO2 VEN: 20.5 mmHg — AB (ref 45.0–50.0)
TCO2: 9 mmol/L (ref 0–100)
pH, Ven: 7.217 — ABNORMAL LOW (ref 7.250–7.300)
pO2, Ven: 145 mmHg — ABNORMAL HIGH (ref 30.0–45.0)

## 2014-09-30 LAB — I-STAT TROPONIN, ED: Troponin i, poc: 0 ng/mL (ref 0.00–0.08)

## 2014-09-30 LAB — GLUCOSE, CAPILLARY
GLUCOSE-CAPILLARY: 376 mg/dL — AB (ref 70–99)
Glucose-Capillary: 342 mg/dL — ABNORMAL HIGH (ref 70–99)
Glucose-Capillary: 328 mg/dL — ABNORMAL HIGH (ref 70–99)
Glucose-Capillary: 303 mg/dL — ABNORMAL HIGH (ref 70–99)
Glucose-Capillary: 316 mg/dL — ABNORMAL HIGH (ref 70–99)

## 2014-09-30 LAB — SAMPLE TO BLOOD BANK

## 2014-09-30 LAB — URINALYSIS, ROUTINE W REFLEX MICROSCOPIC
BILIRUBIN URINE: NEGATIVE
BILIRUBIN URINE: NEGATIVE
Glucose, UA: >1000 mg/dL — AB
Hgb urine dipstick: NEGATIVE
Ketones, ur: >80 mg/dL — AB
Ketones, ur: >80 mg/dL — AB
Leukocytes, UA: NEGATIVE
Leukocytes, UA: NEGATIVE
Nitrite: NEGATIVE
Nitrite: NEGATIVE
Protein, ur: NEGATIVE mg/dL
Protein, ur: 30 mg/dL — AB
SPECIFIC GRAVITY, URINE: 1.027 (ref 1.005–1.030)
Specific Gravity, Urine: 1.02 (ref 1.005–1.030)
UROBILINOGEN UA: 0.2 mg/dL (ref 0.0–1.0)
Urobilinogen, UA: 0.2 mg/dL (ref 0.0–1.0)
pH: 5.5 (ref 5.0–8.0)
pH: 5 (ref 5.0–8.0)

## 2014-09-30 LAB — KETONES, QUALITATIVE

## 2014-09-30 LAB — URINE MICROSCOPIC-ADD ON

## 2014-09-30 LAB — PROTIME-INR
INR: 1.24 (ref 0.00–1.49)
Prothrombin Time: 15.7 seconds — ABNORMAL HIGH (ref 11.6–15.2)

## 2014-09-30 LAB — LIPASE, BLOOD: LIPASE: 114 U/L — AB (ref 11–59)

## 2014-09-30 LAB — CBG MONITORING, ED: GLUCOSE-CAPILLARY: 439 mg/dL — AB (ref 70–99)

## 2014-09-30 LAB — PROCALCITONIN: Procalcitonin: 10.61 ng/mL

## 2014-09-30 LAB — PRO B NATRIURETIC PEPTIDE: PRO B NATRI PEPTIDE: 794.3 pg/mL — AB (ref 0–125)

## 2014-09-30 LAB — LACTIC ACID, PLASMA: Lactic Acid, Venous: 4.5 mmol/L — ABNORMAL HIGH (ref 0.5–2.2)

## 2014-09-30 LAB — AMYLASE: AMYLASE: 130 U/L — AB (ref 0–105)

## 2014-09-30 LAB — TROPONIN I: Troponin I: <0.3 ng/mL (ref <0.30)

## 2014-09-30 LAB — MRSA PCR SCREENING: MRSA by PCR: POSITIVE — AB

## 2014-09-30 MED ORDER — SODIUM CHLORIDE 0.9 % IV SOLN
INTRAVENOUS | Status: DC
  Administered 2014-09-30: 5.4 [IU]/h via INTRAVENOUS
  Filled 2014-09-30: qty 2.5

## 2014-09-30 MED ORDER — SODIUM CHLORIDE 0.9 % IV SOLN
INTRAVENOUS | Status: DC
  Administered 2014-09-30: 1000 mL via INTRAVENOUS

## 2014-09-30 MED ORDER — MUPIROCIN 2 % EX OINT
1.0000 "application " | TOPICAL_OINTMENT | Freq: Two times a day (BID) | CUTANEOUS | Status: DC
  Administered 2014-09-30 – 2014-10-04 (×8): 1 via NASAL
  Filled 2014-09-30 (×4): qty 22

## 2014-09-30 MED ORDER — DEXTROSE-NACL 5-0.45 % IV SOLN
INTRAVENOUS | Status: DC
  Administered 2014-10-01 (×2): via INTRAVENOUS

## 2014-09-30 MED ORDER — SODIUM CHLORIDE 0.9 % IV BOLUS (SEPSIS)
2000.0000 mL | Freq: Once | INTRAVENOUS | Status: AC
  Administered 2014-09-30: 2000 mL via INTRAVENOUS

## 2014-09-30 MED ORDER — ESCITALOPRAM OXALATE 20 MG PO TABS
20.0000 mg | ORAL_TABLET | Freq: Every day | ORAL | Status: DC
  Administered 2014-09-30 – 2014-10-04 (×5): 20 mg via ORAL
  Filled 2014-09-30 (×5): qty 1

## 2014-09-30 MED ORDER — CHLORHEXIDINE GLUCONATE CLOTH 2 % EX PADS
6.0000 | MEDICATED_PAD | Freq: Every day | CUTANEOUS | Status: DC
  Administered 2014-10-01 – 2014-10-04 (×4): 6 via TOPICAL

## 2014-09-30 MED ORDER — SODIUM CHLORIDE 0.9 % IV BOLUS (SEPSIS)
1000.0000 mL | Freq: Once | INTRAVENOUS | Status: AC
  Administered 2014-09-30: 1000 mL via INTRAVENOUS

## 2014-09-30 MED ORDER — INSULIN REGULAR HUMAN 100 UNIT/ML IJ SOLN
INTRAMUSCULAR | Status: DC
  Administered 2014-09-30: 5.6 [IU]/h via INTRAVENOUS
  Filled 2014-09-30: qty 2.5

## 2014-09-30 MED ORDER — TRAMADOL HCL 50 MG PO TABS
50.0000 mg | ORAL_TABLET | Freq: Four times a day (QID) | ORAL | Status: DC | PRN
  Administered 2014-10-02 – 2014-10-03 (×4): 50 mg via ORAL
  Filled 2014-09-30 (×4): qty 1

## 2014-09-30 MED ORDER — SODIUM CHLORIDE 0.9 % IV SOLN: INTRAVENOUS | Status: AC

## 2014-09-30 MED ORDER — SODIUM CHLORIDE 0.9 % IV SOLN
INTRAVENOUS | Status: DC
  Administered 2014-09-30: 21:00:00 via INTRAVENOUS

## 2014-09-30 MED ORDER — DEXTROSE 50 % IV SOLN: 25.0000 mL | INTRAVENOUS | Status: DC | PRN

## 2014-09-30 MED ORDER — PANTOPRAZOLE SODIUM 40 MG IV SOLR
40.0000 mg | INTRAVENOUS | Status: DC
  Administered 2014-09-30 – 2014-10-01 (×2): 40 mg via INTRAVENOUS
  Filled 2014-09-30 (×2): qty 40

## 2014-09-30 MED ORDER — HEPARIN SODIUM (PORCINE) 5000 UNIT/ML IJ SOLN: 5000.0000 [IU] | Freq: Three times a day (TID) | INTRAMUSCULAR | Status: DC

## 2014-09-30 NOTE — ED Provider Notes (Signed)
CSN: [id number]     Arrival date & time 09/30/14  7408 History   First MD Initiated Contact with Patient 09/30/14 1000     Chief Complaint  Patient presents with  . Fall  . Altered Mental Status     (Consider location/radiation/quality/duration/timing/severity/associated sxs/prior Treatment) HPI   Arnett Galindez is a 53 y.o. female  Who is here for evaluation of fall.  Her son, called EMS, after he found her on the floor.  EMS arrived and found her to be unresponsive, but she woke up shortly thereafter.  EMS noted that she had coffee-ground materials in her mouth and around her, indicating an upper GI bleed with vomiting.  Patient also told EMS that she could not see as well as usual.  She is unable to give additional history.  Level V Caveat- severe illness  Past Medical History  Diagnosis Date  . CAD (coronary artery disease)   . Obesity   . Hypercholesteremia   . HTN (hypertension)   . Dyslipidemia   . Stroke syndrome   . Aortic stenosis   . Thrombophlebitis   . Diabetes   . Hyperlipidemia   . Rheumatoid arthritis(714.0)    Past Surgical History  Procedure Laterality Date  . Cesarean section    . Foot fracture surgery    . Knee surgery    . Neuroplasty / transposition median nerve at carpal tunnel     Family History  Problem Relation Age of Onset  . Stroke     History  Substance Use Topics  . Smoking status: Never Smoker   . Smokeless tobacco: Not on file  . Alcohol Use: No   OB History    No data available     Review of Systems  Unable to perform ROS     Allergies  Celebrex and Detrol  Home Medications   Prior to Admission medications   Medication Sig Start Date End Date Taking? Authorizing Provider  aspirin 81 MG tablet Take 1 tablet (81 mg total) by mouth daily. 03/22/14  Yes Daniel J Angiulli, PA-C  baclofen (LIORESAL) 10 MG tablet Take 1 tablet (10 mg total) by mouth 2 (two) times daily. 03/22/14  Yes Daniel J Angiulli, PA-C  escitalopram  (LEXAPRO) 20 MG tablet Take 20 mg by mouth daily.   Yes Historical Provider, MD  Insulin Detemir (LEVEMIR FLEXTOUCH) 100 UNIT/ML Pen Inject 50 Units into the skin every morning. and pen needles 1/day 08/23/14  Yes Romero Belling, MD  omeprazole (PRILOSEC) 40 MG capsule Take 1 capsule (40 mg total) by mouth daily. 03/22/14  Yes Daniel J Angiulli, PA-C  pravastatin (PRAVACHOL) 20 MG tablet TAKE 1 TABLET EVERY DAY 06/01/14  Yes Baker Pierini, FNP  solifenacin (VESICARE) 5 MG tablet Take 1 tablet (5 mg total) by mouth daily. 06/04/14  Yes Baker Pierini, FNP  warfarin (COUMADIN) 7.5 MG tablet Take 7.5 mg by mouth daily.   Yes Historical Provider, MD  glucose blood [facility] BLOOD GLUCOSE TEST) test strip Use to check blood sugar twice daily 07/16/14   Romero Belling, MD  glucose blood test strip Test 5 times a day, and lancets 2/day 250.03 08/06/14   Romero Belling, MD  traMADol (ULTRAM) 50 MG tablet TAKE 1 OR 2 TABLETS BY MOUTH EVERY 8 HOURS AS NEEDED 08/04/14   Baker Pierini, FNP  warfarin (COUMADIN) 5 MG tablet Take as directed by anticoagulation clinic Patient not taking: Reported on 09/30/2014 06/02/14   Baker Pierini, FNP   BP  100/48 mmHg  Pulse 138  Temp(Src) 99.3 F (37.4 C) (Core (Comment))  Resp 33  Ht 5\' 2"  (1.575 m)  Wt 163 lb 2.3 oz (74 kg)  BMI 29.83 kg/m2  SpO2 100% Physical Exam  Constitutional: She is oriented to person, place, and time. She appears well-developed and well-nourished.  HENT:  Head: Normocephalic and atraumatic.  Right Ear: External ear normal.  Left Ear: External ear normal.  No palpable skull defect.  Eyes: Conjunctivae and EOM are normal. Pupils are equal, round, and reactive to light.  Pupils 4 mm bilaterally, minimally  Reactive.  Neck: Normal range of motion and phonation normal. Neck supple.  Cardiovascular: Regular rhythm and normal heart sounds.   tachycardia  Pulmonary/Chest: Effort normal and breath sounds normal. She exhibits no bony  tenderness.  Abdominal: Soft. She exhibits mass. There is no tenderness. There is no rebound and no guarding.  Lower abdominal mass in the midline consistent with bladder distention.  Musculoskeletal: Normal range of motion.  No tenderness of the cervical, thoracic or lumbar spine.  Examined during logrolling maneuver.  No posterior thorax or lumbar injuries.  Neurological: She is alert and oriented to person, place, and time. No cranial nerve deficit or sensory deficit. She exhibits normal muscle tone. Coordination normal.  Dysarthria is present.  There is no nystagmus.  Skin: Skin is warm, dry and intact.  Psychiatric:  Nonsensical words  Nursing note and vitals reviewed.   ED Course  Procedures (including critical care time)  Medications  dextrose 5 %-0.45 % sodium chloride infusion (not administered)  insulin regular (NOVOLIN R,HUMULIN R) 250 Units in sodium chloride 0.9 % 250 mL (1 Units/mL) infusion (not administered)  dextrose 50 % solution 25 mL (not administered)  0.9 %  sodium chloride infusion (not administered)  escitalopram (LEXAPRO) tablet 20 mg (not administered)  traMADol (ULTRAM) tablet 50 mg (not administered)  pantoprazole (PROTONIX) injection 40 mg (not administered)  sodium chloride 0.9 % bolus 2,000 mL (0 mLs Intravenous Stopped 09/30/14 1250)  sodium chloride 0.9 % bolus 1,000 mL (1,000 mLs Intravenous New Bag/Given 09/30/14 1333)    Patient Vitals for the past 24 hrs:  BP Temp Temp src Pulse Resp SpO2 Height Weight  09/30/14 1633 - - - - - - 5\' 2"  (1.575 m) 163 lb 2.3 oz (74 kg)  09/30/14 1545 (!) 100/48 mmHg - - (!) 138 (!) 33 100 % - -  09/30/14 1530 (!) 114/36 mmHg - - (!) 137 (!) 33 100 % - -  09/30/14 1515 116/65 mmHg - - (!) 144 (!) 33 99 % - -  09/30/14 1500 102/62 mmHg - - (!) 137 21 95 % - -  09/30/14 1445 (!) 101/42 mmHg - - (!) 132 (!) 32 96 % - -  09/30/14 1430 (!) 105/53 mmHg - - (!) 131 (!) 28 100 % - -  09/30/14 1415 (!) 112/42 mmHg - - (!)  131 (!) 35 99 % - -  09/30/14 1400 (!) 107/40 mmHg - - (!) 127 (!) 35 98 % - -  09/30/14 1345 (!) 105/25 mmHg - - (!) 124 21 96 % - -  09/30/14 1330 (!) 100/45 mmHg - - (!) 126 (!) 35 100 % - -  09/30/14 1315 (!) 98/31 mmHg - - (!) 124 (!) 33 99 % - -  09/30/14 1300 (!) 99/46 mmHg - - (!) 122 26 100 % - -  09/30/14 1245 (!) 111/37 mmHg - - (!) 121 (!) 32 100 % - -  09/30/14 1230 (!) 124/36 mmHg - - (!) 122 26 100 % - -  09/30/14 1215 (!) 107/36 mmHg - - 116 (!) 30 100 % - -  09/30/14 1206 - 99.3 F (37.4 C) Core 118 26 100 % - -  09/30/14 1202 (!) 117/39 mmHg - - - 22 - - -  09/30/14 1035 121/71 mmHg 97.9 F (36.6 C) Temporal 115 20 100 % - -  09/30/14 1030 (!) 88/50 mmHg - - 120 22 100 % - -  09/30/14 1015 (!) 95/49 mmHg - - 113 26 100 % - -  09/30/14 1007 - - - - - - 5\' 3"  (1.6 m) 167 lb (75.751 kg)  09/30/14 0952 131/58 mmHg 97.6 F (36.4 C) Oral 110 24 100 % - -    1:22 PM Reevaluation with update and discussion. After initial assessment and treatment, an updated evaluation reveals she is more alert, calm and cooperative at this time. Ayvion Kavanagh L   1:23 PM-Consult complete with Dr. Rito Ehrlich and Dr. Molli Knock. Patient case explained and discussed. They agree to admit patient for further evaluation and treatment. Call ended at 13:40  CRITICAL CARE Performed by: Flint Melter Total critical care time: 55 minutes Critical care time was exclusive of separately billable procedures and treating other patients. Critical care was necessary to treat or prevent imminent or life-threatening deterioration. Critical care was time spent personally by me on the following activities: development of treatment plan with patient and/or surrogate as well as nursing, discussions with consultants, evaluation of patient's response to treatment, examination of patient, obtaining history from patient or surrogate, ordering and performing treatments and interventions, ordering and review of laboratory  studies, ordering and review of radiographic studies, pulse oximetry and re-evaluation of patient's condition.   Labs Review Labs Reviewed  CBC WITH DIFFERENTIAL - Abnormal; Notable for the following:    WBC 23.1 (*)    Hemoglobin 11.2 (*)    HCT 35.1 (*)    Lymphocytes Relative 10 (*)    Band Neutrophils 20 (*)    Neutro Abs 19.4 (*)    Monocytes Absolute 1.4 (*)    All other components within normal limits  PROTIME-INR - Abnormal; Notable for the following:    Prothrombin Time 15.7 (*)    All other components within normal limits  URINALYSIS, ROUTINE W REFLEX MICROSCOPIC - Abnormal; Notable for the following:    Glucose, UA >1000 (*)    Ketones, ur >80 (*)    All other components within normal limits  PRO B NATRIURETIC PEPTIDE - Abnormal; Notable for the following:    Pro B Natriuretic peptide (BNP) 794.3 (*)    All other components within normal limits  I-STAT CHEM 8, ED - Abnormal; Notable for the following:    Sodium 131 (*)    Potassium 5.5 (*)    Glucose, Bld >700 (*)    All other components within normal limits  I-STAT VENOUS BLOOD GAS, ED - Abnormal; Notable for the following:    pH, Ven 7.217 (*)    pCO2, Ven 20.5 (*)    pO2, Ven 145.0 (*)    Bicarbonate 8.3 (*)    Acid-base deficit 17.0 (*)    All other components within normal limits  CBG MONITORING, ED - Abnormal; Notable for the following:    Glucose-Capillary >600 (*)    All other components within normal limits  CBG MONITORING, ED - Abnormal; Notable for the following:    Glucose-Capillary >600 (*)    All  other components within normal limits  CBG MONITORING, ED - Abnormal; Notable for the following:    Glucose-Capillary 439 (*)    All other components within normal limits  MRSA PCR SCREENING  URINE MICROSCOPIC-ADD ON  TROPONIN I  BASIC METABOLIC PANEL  BASIC METABOLIC PANEL  BASIC METABOLIC PANEL  BASIC METABOLIC PANEL  CBC  LACTIC ACID, PLASMA  KETONES, QUALITATIVE  PROCALCITONIN  CBC   AMYLASE  LIPASE, BLOOD  PROCALCITONIN  CBC  PROTIME-INR  I-STAT TROPOININ, ED  I-STAT VENOUS BLOOD GAS, ED  SAMPLE TO BLOOD BANK    Imaging Review Dg Chest 2 View  09/30/2014   CLINICAL DATA:  Larey Seat today, left-sided weakness and confusion, initial evaluation  EXAM: CHEST  2 VIEW  COMPARISON:  03/08/2014  FINDINGS: The heart size and vascular pattern are normal. No consolidation effusion or pneumothorax. Bony thorax is intact. Replacement aortic valve noted.  IMPRESSION: No active cardiopulmonary disease.   Electronically Signed   By: Esperanza Heir M.D.   On: 09/30/2014 11:37   Dg Cervical Spine Complete  09/30/2014   CLINICAL DATA:  Larey Seat today, initial evaluation, left-sided weakness  EXAM: CERVICAL SPINE  4+ VIEWS  COMPARISON:  None.  FINDINGS: There is no evidence of cervical spine fracture or prevertebral soft tissue swelling. Alignment is normal. No other significant bone abnormalities are identified.  IMPRESSION: Negative cervical spine radiographs.   Electronically Signed   By: Esperanza Heir M.D.   On: 09/30/2014 11:34   Ct Head Wo Contrast  09/30/2014   CLINICAL DATA:  Patient fell at home, altered mental status, personal history of stroke, new onset of blindness  EXAM: CT HEAD WITHOUT CONTRAST  TECHNIQUE: Contiguous axial images were obtained from the base of the skull through the vertex without intravenous contrast.  COMPARISON:  03/08/2014  FINDINGS: There is diffuse atrophy. There is widespread encephalomalacia bilaterally including right frontal lobe, right parietal lobe, and right occipital lobe. On the left, there is focal encephalomalacia in the anterior parietal lobe and in the occipital lobe. There is left cerebellar hemisphere encephalomalacia. There is diffuse low attenuation in the white matter. There are no new areas of abnormality. There is no hemorrhage or extra-axial fluid. There is no hydrocephalus. The calvarium is intact. There is intracranial internal carotid  artery and bilateral vertebral artery calcification.  IMPRESSION: Extensive chronic encephalomalacia with no acute findings.   Electronically Signed   By: Esperanza Heir M.D.   On: 09/30/2014 11:16     EKG Interpretation   Date/Time:  Thursday September 30 2014 10:00:39 EST Ventricular Rate:  113 PR Interval:  160 QRS Duration: 95 QT Interval:  340 QTC Calculation: 466 R Axis:   106 Text Interpretation:  Sinus tachycardia Probable left atrial enlargement  Right axis deviation ST elev, probable normal early repol pattern since  last tracing no significant change Confirmed by Effie Shy  MD, Mechele Collin ([id number])  on 09/30/2014 10:30:20 AM      MDM   Final diagnoses:  Diabetic ketoacidosis without coma associated with type 1 diabetes mellitus  Volume depletion  Fall, initial encounter  Transient alteration of awareness  Transient hypotension    Fall with findings for altered mental status, DKA.  Visual abnormalities, likely related to hyperglycemia.I suspect significant fluid  Volume depletion.  No clear source for infection, with elevated white blood cell count. The patient will require admission for continued fluid resuscitation, and treatment of DKA with parenteral insulin.  Nursing Notes Reviewed/ Care Coordinated, and agree without changes. Applicable  Imaging Reviewed.  Interpretation of Laboratory Data incorporated into ED treatment  Plan: Admit    Flint Melter, MD 09/30/14 [phone]

## 2014-09-30 NOTE — ED Notes (Signed)
Pt more lethargic; not communicative; not answering questions; stares blankly at RN when talking to her; MD made aware and at bedside.

## 2014-09-30 NOTE — ED Notes (Signed)
Pt arrives via EMS from home. Young son called 911 because his mom fell. Pt found supine by EMS with altered mental status, blood around face. Now more alert, able to answer some questions. Pt with new onset blindness per EMS. CBG meter reading >600. 22g left wrist.

## 2014-09-30 NOTE — ED Notes (Signed)
Pt continues to be tachycardic, tachypneic and hypotensive.  Dr Effie Shy notified and 3rd bolus given.  Insulin arrived from pharm and drip started.

## 2014-09-30 NOTE — Progress Notes (Signed)
Critical lab Co2 - 9 called to Dr. Craige Cotta. Will continue to monitor.

## 2014-09-30 NOTE — Clinical Social Work Note (Signed)
Clinical Social Worker present upon patient and patient son arrival.  Patient son, Apolinar Junes (53 years old) called 911 when he found patient down on the ground.  RN was able to locate family contact information from patient son school.  Patient sister and niece were available to pick patient son up and provide necessary care while patient hospitalized.  CSW to notify unit CSW once patient is admitted of patient situation.  CSW remains available for support.  Macario Golds, [place] [phone]

## 2014-09-30 NOTE — Progress Notes (Signed)
Attempted report from ED. RN will call back, per operator.

## 2014-09-30 NOTE — ED Notes (Signed)
Attempted report 

## 2014-09-30 NOTE — H&P (Signed)
PULMONARY / CRITICAL CARE MEDICINE   Name: Madison [name] MRN: [id number] DOB: 1961/02/15    ADMISSION DATE:  09/30/2014 CONSULTATION DATE: 09/30/2014  REFERRING MD :  VOHYW  CHIEF COMPLAINT:  Metabolic acidosis  INITIAL PRESENTATION:  Pt presented via EMS to [facility] ED for AMS, fall and was found to be in DKA.    STUDIES:   SIGNIFICANT EVENTS: 11/12 > pt presented via EMS to [facility] ED for AMS, admitted for DKA   HISTORY OF PRESENT ILLNESS:   PT is a 53 y/o female with a complicated medical history of insulin-dependent DM, CAD, aortic valve replacement, multiple CVA's with loss of functional status which has complicated managing her multiple medical issues. Pt presented to [facility] ED via ambulance after son found her on the floor and altered, with coffee-ground emesis in and around her mouth.  In ED pt stated she had new onset of blindness, she was tachypnic and tachycardic and labs revealed metabolic acidosis, DKA with sugars > 600, and leukocytosis.  C-spine was cleared by CT.  Mental status improved with 3 L fluid bolus and insulin drip. According to ER RN, pt sister states she has been "battling an illness" over the past week, however it is unknown if this is a precipitating illness or symptoms of DKA. PCCM was consulted for admit. Upon exam, pt was alert and oriented to person and time.  Speech is very difficult to understand, however, she is asking for water and attempting to answer questions.  States she has had nausea, vomiting x 4 days with 2 episodes of diarrhea, anorexia, and has not taken insulin for the past two days.  She denies fever an abdominal pain. She will be admitted to MICU for DKA management and further workup of hematemesis and possible other sources of infection.  PAST MEDICAL HISTORY :   has a past medical history of CAD (coronary artery disease); Obesity; Hypercholesteremia; HTN (hypertension); Dyslipidemia; Stroke syndrome; Aortic stenosis; Thrombophlebitis; Diabetes;  Hyperlipidemia; and Rheumatoid arthritis(714.0).  has past surgical history that includes Cesarean section; Foot fracture surgery; Knee surgery; and Neuroplasty / transposition median nerve at carpal tunnel.   Prior to Admission medications   Medication Sig Start Date End Date Taking? Authorizing Provider  aspirin 81 MG tablet Take 1 tablet (81 mg total) by mouth daily. 03/22/14  Yes Daniel J Angiulli, PA-C  baclofen (LIORESAL) 10 MG tablet Take 1 tablet (10 mg total) by mouth 2 (two) times daily. 03/22/14  Yes Daniel J Angiulli, PA-C  escitalopram (LEXAPRO) 20 MG tablet Take 20 mg by mouth daily.   Yes Historical Provider, MD  Insulin Detemir (LEVEMIR FLEXTOUCH) 100 UNIT/ML Pen Inject 50 Units into the skin every morning. and pen needles 1/day 08/23/14  Yes Romero Belling, MD  omeprazole (PRILOSEC) 40 MG capsule Take 1 capsule (40 mg total) by mouth daily. 03/22/14  Yes Daniel J Angiulli, PA-C  pravastatin (PRAVACHOL) 20 MG tablet TAKE 1 TABLET EVERY DAY 06/01/14  Yes Baker Pierini, FNP  solifenacin (VESICARE) 5 MG tablet Take 1 tablet (5 mg total) by mouth daily. 06/04/14  Yes Baker Pierini, FNP  warfarin (COUMADIN) 7.5 MG tablet Take 7.5 mg by mouth daily.   Yes Historical Provider, MD  glucose blood [facility] BLOOD GLUCOSE TEST) test strip Use to check blood sugar twice daily 07/16/14   Romero Belling, MD  glucose blood test strip Test 5 times a day, and lancets 2/day 250.03 08/06/14   Romero Belling, MD  traMADol (ULTRAM) 50 MG tablet TAKE  1 OR 2 TABLETS BY MOUTH EVERY 8 HOURS AS NEEDED 08/04/14   Baker Pierini, FNP  warfarin (COUMADIN) 5 MG tablet Take as directed by anticoagulation clinic Patient not taking: Reported on 09/30/2014 06/02/14   Baker Pierini, FNP   Allergies  Allergen Reactions  . Celebrex [Celecoxib] Rash  . Detrol [Tolterodine] Hives    FAMILY HISTORY:  has no family status information on file.  SOCIAL HISTORY:  reports that she has never smoked. She does not have  any smokeless tobacco history on file. She reports that she does not drink alcohol or use illicit drugs.  REVIEW OF SYSTEMS:   All pertinent review of systems noted above, all otherwise reviewed and negative.    SUBJECTIVE:    VITAL SIGNS: Temp:  [97.6 F (36.4 C)-99.3 F (37.4 C)] 99.3 F (37.4 C) (11/12 1206) Pulse Rate:  [110-127] 127 (11/12 1400) Resp:  [20-35] 35 (11/12 1400) BP: (88-131)/(25-71) 107/40 mmHg (11/12 1400) SpO2:  [96 %-100 %] 98 % (11/12 1400) Weight:  [167 lb (75.751 kg)] 167 lb (75.751 kg) (11/12 1007) HEMODYNAMICS:   VENTILATOR SETTINGS:   INTAKE / OUTPUT:  Intake/Output Summary (Last 24 hours) at 09/30/14 1551 Last data filed at 09/30/14 1250  Gross per 24 hour  Intake   2000 ml  Output   1650 ml  Net    350 ml    PHYSICAL EXAMINATION: General:  Ill-appearing female, appears much older than stated age,  Neuro:  A&O to person and time, PERRL HEENT:  [place] AT, oral mucosa extremely dry with black tongue/ coffee-ground emesis on tongue and face Cardiovascular:  Sinus tach, no M, G, R Lungs:  CTA A&P with no wheeze, rhonchi or crackles Abdomen:  Obese, soft, non-tender, +BS, no guarding or rebound Musculoskeletal:  WNL Skin:  Pale, warm, intact, no edema  LABS:  CBC  Recent Labs Lab 09/30/14 1035 09/30/14 1045  WBC  --  23.1*  HGB 14.3 11.2*  HCT 42.0 35.1*  PLT  --  286   Coag's  Recent Labs Lab 09/30/14 1045  INR 1.24   BMET  Recent Labs Lab 09/30/14 1035  NA 131*  K 5.5*  CL 100  BUN 23  CREATININE 1.10  GLUCOSE >700*   Electrolytes No results for input(s): CALCIUM, MG, PHOS in the last 168 hours. Sepsis Markers No results for input(s): LATICACIDVEN, PROCALCITON, O2SATVEN in the last 168 hours. ABG No results for input(s): PHART, PCO2ART, PO2ART in the last 168 hours. Liver Enzymes No results for input(s): AST, ALT, ALKPHOS, BILITOT, ALBUMIN in the last 168 hours. Cardiac Enzymes  Recent Labs Lab 09/30/14 1431   TROPONINI <0.30  PROBNP 794.3*   Glucose  Recent Labs Lab 09/30/14 1243 09/30/14 1431 09/30/14 1540  GLUCAP >600* >600* 439*    Imaging No results found.   ASSESSMENT / PLAN:  ENDOCRINE A:  DKA Hyperkalemia - 5.5 - monitor with hyperglycemia correction with insulin drip DM insulin dependent - hx of multiple admits and difficult management s/p CVA's in 2014 P:   Aggressive fluids NS @ 125 with insulin drip Monitor BMET q 2h  PULMONARY A: Tachypnea - Respiratory Alkalosis compensatory for metabolic acidosis due to DKA P:   Monitor O2 sats and pt clinically for respiratory fatigue/MS  CARDIOVASCULAR A:  Tachycardia r/t DKA Hypotension - soft BP CAD - neg trop Hx AVR with coumadin  - currently subtherapeutic P:  Tele monitoring  Hold heparin for possible upper GI bleed  RENAL A:  Metabolic acidosis - DKA P:   Lactic acid, ketones Monitor UO\  GASTROINTESTINAL A:  Coffee-ground emesis ?? UGI bleed  P:   Gastric occult blood with any further emesis - may need GI referral  PPI prophylaxis Sips and chips Amylase/lipase  HEMATOLOGIC A:   Chronic anticoagulation - coumadin for AVR - currently subtherapeutic, with history of non-compliance Anemia - normocytic - acute blood loss due to GI bleed? P:  Hold heparin for possible upper GI bleed SCD's for DVT prophylaxis CBC   INFECTIOUS A: Leukocytosis - WBC 23.1 with 20% bands - unknown etiology, possible septic cause of DKA?  CXR and UA reassuring, pct protocol P:   Pct, daily CBC  NEUROLOGIC A:   AMS - Metabolic Encephalopathy r/t DKA Hx of multiple CVA's Hx Neuropathy Hx depression/anxiety P:   Home med - lexapro Tramadol for mod pain  Danelle Berry  DKA with history of non-compliance.  Will place on the DKA protocol.  Anticoag for valve will need to be addressed.  However, tongue appears with black material on it.  Would like to r/o hematemesis first and if negative then start heparin drip  and coumadin when more stable.  My CC time 45 min.  Patient seen and examined, agree with above note.  I dictated the care and orders written for this patient under my direction.  Alyson Reedy, MD [phone]

## 2014-09-30 NOTE — ED Notes (Signed)
Pt states she can see better - able to tell me how many fingers I'm holding up.

## 2014-10-01 ENCOUNTER — Encounter (HOSPITAL_COMMUNITY): Payer: Self-pay | Admitting: *Deleted

## 2014-10-01 DIAGNOSIS — E1010 Type 1 diabetes mellitus with ketoacidosis without coma: Secondary | ICD-10-CM

## 2014-10-01 DIAGNOSIS — T17908A Unspecified foreign body in respiratory tract, part unspecified causing other injury, initial encounter: Secondary | ICD-10-CM | POA: Insufficient documentation

## 2014-10-01 DIAGNOSIS — R400 Somnolence: Secondary | ICD-10-CM

## 2014-10-01 LAB — GLUCOSE, CAPILLARY
GLUCOSE-CAPILLARY: 152 mg/dL — AB (ref 70–99)
GLUCOSE-CAPILLARY: 260 mg/dL — AB (ref 70–99)
GLUCOSE-CAPILLARY: 80 mg/dL (ref 70–99)
GLUCOSE-CAPILLARY: 81 mg/dL (ref 70–99)
Glucose-Capillary: 331 mg/dL — ABNORMAL HIGH (ref 70–99)
Glucose-Capillary: 304 mg/dL — ABNORMAL HIGH (ref 70–99)
Glucose-Capillary: 231 mg/dL — ABNORMAL HIGH (ref 70–99)
Glucose-Capillary: 130 mg/dL — ABNORMAL HIGH (ref 70–99)
Glucose-Capillary: 88 mg/dL (ref 70–99)
Glucose-Capillary: 80 mg/dL (ref 70–99)
Glucose-Capillary: 71 mg/dL (ref 70–99)
Glucose-Capillary: 103 mg/dL — ABNORMAL HIGH (ref 70–99)
Glucose-Capillary: 238 mg/dL — ABNORMAL HIGH (ref 70–99)
Glucose-Capillary: 263 mg/dL — ABNORMAL HIGH (ref 70–99)
Glucose-Capillary: 147 mg/dL — ABNORMAL HIGH (ref 70–99)

## 2014-10-01 LAB — BASIC METABOLIC PANEL
Anion gap: 14 (ref 5–15)
Anion gap: 16 — ABNORMAL HIGH (ref 5–15)
BUN: 22 mg/dL (ref 6–23)
BUN: 17 mg/dL (ref 6–23)
CHLORIDE: 116 meq/L — AB (ref 96–112)
CO2: 18 mEq/L — ABNORMAL LOW (ref 19–32)
CO2: 17 mEq/L — ABNORMAL LOW (ref 19–32)
Calcium: 8.6 mg/dL (ref 8.4–10.5)
Calcium: 8.4 mg/dL (ref 8.4–10.5)
Chloride: 113 mEq/L — ABNORMAL HIGH (ref 96–112)
Creatinine, Ser: 0.98 mg/dL (ref 0.50–1.10)
Creatinine, Ser: 0.84 mg/dL (ref 0.50–1.10)
GFR calc non Af Amer: 65 mL/min — ABNORMAL LOW (ref >90)
GFR calc non Af Amer: 78 mL/min — ABNORMAL LOW (ref >90)
GFR, EST AFRICAN AMERICAN: 75 mL/min — AB (ref >90)
GLUCOSE: 117 mg/dL — AB (ref 70–99)
Glucose, Bld: 170 mg/dL — ABNORMAL HIGH (ref 70–99)
POTASSIUM: 3.6 meq/L — AB (ref 3.7–5.3)
Potassium: 4.4 mEq/L (ref 3.7–5.3)
SODIUM: 146 meq/L (ref 137–147)
Sodium: 148 mEq/L — ABNORMAL HIGH (ref 137–147)

## 2014-10-01 LAB — BASIC METABOLIC PANEL WITH GFR
Anion gap: 15 (ref 5–15)
Anion gap: 13 (ref 5–15)
Anion gap: 15 (ref 5–15)
Anion gap: 15 (ref 5–15)
BUN: 21 mg/dL (ref 6–23)
BUN: 15 mg/dL (ref 6–23)
BUN: 14 mg/dL (ref 6–23)
BUN: 12 mg/dL (ref 6–23)
CO2: 16 meq/L — ABNORMAL LOW (ref 19–32)
CO2: 19 meq/L (ref 19–32)
CO2: 17 meq/L — ABNORMAL LOW (ref 19–32)
CO2: 19 meq/L (ref 19–32)
Calcium: 8.5 mg/dL (ref 8.4–10.5)
Calcium: 8.3 mg/dL — ABNORMAL LOW (ref 8.4–10.5)
Calcium: 8.6 mg/dL (ref 8.4–10.5)
Calcium: 8.5 mg/dL (ref 8.4–10.5)
Chloride: 114 meq/L — ABNORMAL HIGH (ref 96–112)
Chloride: 107 meq/L (ref 96–112)
Chloride: 108 meq/L (ref 96–112)
Chloride: 109 meq/L (ref 96–112)
Creatinine, Ser: 0.93 mg/dL (ref 0.50–1.10)
Creatinine, Ser: 0.8 mg/dL (ref 0.50–1.10)
Creatinine, Ser: 0.77 mg/dL (ref 0.50–1.10)
Creatinine, Ser: 0.75 mg/dL (ref 0.50–1.10)
GFR calc Af Amer: 80 mL/min — ABNORMAL LOW
GFR calc Af Amer: >90 mL/min
GFR calc Af Amer: >90 mL/min
GFR calc Af Amer: >90 mL/min
GFR calc non Af Amer: 69 mL/min — ABNORMAL LOW
GFR calc non Af Amer: 83 mL/min — ABNORMAL LOW
GFR calc non Af Amer: >90 mL/min
GFR calc non Af Amer: >90 mL/min
Glucose, Bld: 86 mg/dL (ref 70–99)
Glucose, Bld: 287 mg/dL — ABNORMAL HIGH (ref 70–99)
Glucose, Bld: 232 mg/dL — ABNORMAL HIGH (ref 70–99)
Glucose, Bld: 152 mg/dL — ABNORMAL HIGH (ref 70–99)
Potassium: 4.2 meq/L (ref 3.7–5.3)
Potassium: 4.2 meq/L (ref 3.7–5.3)
Potassium: 3.9 meq/L (ref 3.7–5.3)
Potassium: 4 meq/L (ref 3.7–5.3)
Sodium: 145 meq/L (ref 137–147)
Sodium: 139 meq/L (ref 137–147)
Sodium: 140 meq/L (ref 137–147)
Sodium: 143 meq/L (ref 137–147)

## 2014-10-01 LAB — CBC
HEMATOCRIT: 30.9 % — AB (ref 36.0–46.0)
Hemoglobin: 10.2 g/dL — ABNORMAL LOW (ref 12.0–15.0)
MCH: 27.9 pg (ref 26.0–34.0)
MCHC: 33 g/dL (ref 30.0–36.0)
MCV: 84.7 fL (ref 78.0–100.0)
PLATELETS: 223 10*3/uL (ref 150–400)
RBC: 3.65 MIL/uL — AB (ref 3.87–5.11)
RDW: 14.9 % (ref 11.5–15.5)
WBC: 18.1 10*3/uL — AB (ref 4.0–10.5)

## 2014-10-01 LAB — URINE CULTURE
COLONY COUNT: NO GROWTH
Culture: NO GROWTH

## 2014-10-01 LAB — PROCALCITONIN: PROCALCITONIN: 16.2 ng/mL

## 2014-10-01 LAB — PROTIME-INR
INR: 1.84 — AB (ref 0.00–1.49)
Prothrombin Time: 21.4 seconds — ABNORMAL HIGH (ref 11.6–15.2)

## 2014-10-01 MED ORDER — INSULIN ASPART 100 UNIT/ML ~~LOC~~ SOLN
0.0000 [IU] | Freq: Three times a day (TID) | SUBCUTANEOUS | Status: DC
  Administered 2014-10-01: 8 [IU] via SUBCUTANEOUS
  Administered 2014-10-02: 3 [IU] via SUBCUTANEOUS
  Administered 2014-10-02: 2 [IU] via SUBCUTANEOUS
  Administered 2014-10-02 – 2014-10-03 (×2): 5 [IU] via SUBCUTANEOUS
  Administered 2014-10-03 – 2014-10-04 (×2): 3 [IU] via SUBCUTANEOUS
  Administered 2014-10-04: 8 [IU] via SUBCUTANEOUS

## 2014-10-01 MED ORDER — WARFARIN - PHARMACIST DOSING INPATIENT
Freq: Every day | Status: DC
  Administered 2014-10-03: 18:00:00

## 2014-10-01 MED ORDER — INSULIN GLARGINE 100 UNIT/ML ~~LOC~~ SOLN
30.0000 [IU] | Freq: Every day | SUBCUTANEOUS | Status: DC
  Administered 2014-10-01 – 2014-10-02 (×2): 30 [IU] via SUBCUTANEOUS
  Filled 2014-10-01 (×3): qty 0.3

## 2014-10-01 MED ORDER — SODIUM CHLORIDE 0.45 % IV SOLN
INTRAVENOUS | Status: DC
  Administered 2014-10-01 – 2014-10-04 (×3): via INTRAVENOUS

## 2014-10-01 MED ORDER — INSULIN ASPART 100 UNIT/ML ~~LOC~~ SOLN
0.0000 [IU] | Freq: Every day | SUBCUTANEOUS | Status: DC
  Administered 2014-10-03: 2 [IU] via SUBCUTANEOUS

## 2014-10-01 MED ORDER — AMPICILLIN-SULBACTAM SODIUM 3 (2-1) G IJ SOLR
3.0000 g | Freq: Three times a day (TID) | INTRAMUSCULAR | Status: DC
  Administered 2014-10-01 – 2014-10-02 (×4): 3 g via INTRAVENOUS
  Filled 2014-10-01 (×6): qty 3

## 2014-10-01 MED ORDER — INFLUENZA VAC SPLIT QUAD 0.5 ML IM SUSY
0.5000 mL | PREFILLED_SYRINGE | INTRAMUSCULAR | Status: AC
  Administered 2014-10-02: 0.5 mL via INTRAMUSCULAR
  Filled 2014-10-01: qty 0.5

## 2014-10-01 MED ORDER — WARFARIN SODIUM 7.5 MG PO TABS
7.5000 mg | ORAL_TABLET | Freq: Once | ORAL | Status: AC
  Administered 2014-10-01: 7.5 mg via ORAL
  Filled 2014-10-01: qty 1

## 2014-10-01 NOTE — Plan of Care (Signed)
Problem: Phase I Progression Outcomes Goal: Pain controlled with appropriate interventions Outcome: Completed/Met Date Met:  10/01/14     

## 2014-10-01 NOTE — Plan of Care (Signed)
Problem: Phase I Progression Outcomes Goal: CBGs steadily decreasing on IV insulin drip Outcome: Completed/Met Date Met:  10/01/14

## 2014-10-01 NOTE — H&P (Addendum)
PULMONARY / CRITICAL CARE MEDICINE   Name: Madison [name] MRN: [id number] DOB: 10-01-1961    ADMISSION DATE:  09/30/2014 CONSULTATION DATE: 09/30/2014  REFERRING MD :  ONGEX  CHIEF COMPLAINT:  Metabolic acidosis  INITIAL PRESENTATION:  Pt presented via EMS to [facility] ED for AMS, fall and was found to be in DKA.    STUDIES:   SIGNIFICANT EVENTS: 11/12 > pt presented via EMS to [facility] ED for AMS, admitted for DKA  SUBJECTIVE:  IMproved,  VITAL SIGNS: Temp:  [97.6 F (36.4 C)-100.5 F (38.1 C)] 98.8 F (37.1 C) (11/13 0600) Pulse Rate:  [26-144] 98 (11/13 0600) Resp:  [17-35] 18 (11/13 0600) BP: (88-131)/(25-73) 120/56 mmHg (11/13 0600) SpO2:  [69 %-100 %] 99 % (11/13 0600) Weight:  [74 kg (163 lb 2.3 oz)-75.751 kg (167 lb)] 75.5 kg (166 lb 7.2 oz) (11/13 0400) HEMODYNAMICS:   VENTILATOR SETTINGS:   INTAKE / OUTPUT:  Intake/Output Summary (Last 24 hours) at 10/01/14 0710 Last data filed at 10/01/14 0600  Gross per 24 hour  Intake 3732.65 ml  Output   3225 ml  Net 507.65 ml    PHYSICAL EXAMINATION: General:  Ill-appearing female, appears much older than stated age,  Neuro:  A&O to person and time, PERRL, residual left weakness HEENT:  [place] up Cardiovascular:  Sinus tach, no M, G, R Lungs:  CTA  Abdomen:  Obese, soft, non-tender, +BS, no guarding or rebound Musculoskeletal:  WNL Skin:  Pale, warm, intact, no edema  LABS:  CBC  Recent Labs Lab 09/30/14 1725 09/30/14 2230 10/01/14 0235  WBC 27.0* 20.4* 18.1*  HGB 11.3* 10.7* 10.2*  HCT 34.0* 32.0* 30.9*  PLT 264 243 223   Coag's  Recent Labs Lab 09/30/14 1045 10/01/14 0235  INR 1.24 1.84*   BMET  Recent Labs Lab 09/30/14 2138 10/01/14 0235 10/01/14 0608  NA 143 148* 145  K 5.0 3.6* 4.2  CL 109 116* 114*  CO2 12* 18* 16*  BUN 24* 22 21  CREATININE 1.08 0.98 0.93  GLUCOSE 347* 117* 86   Electrolytes  Recent Labs Lab 09/30/14 2138 10/01/14 0235 10/01/14 0608  CALCIUM 8.8 8.6 8.5   Sepsis  Markers  Recent Labs Lab 09/30/14 1720 10/01/14 0231  LATICACIDVEN 4.5*  --   PROCALCITON 10.61 16.20   ABG No results for input(s): PHART, PCO2ART, PO2ART in the last 168 hours. Liver Enzymes No results for input(s): AST, ALT, ALKPHOS, BILITOT, ALBUMIN in the last 168 hours. Cardiac Enzymes  Recent Labs Lab 09/30/14 1431  TROPONINI <0.30  PROBNP 794.3*   Glucose  Recent Labs Lab 10/01/14 0236 10/01/14 0310 10/01/14 0342 10/01/14 0430 10/01/14 0534 10/01/14 0633  GLUCAP 130* 88 80 71 81 80    Imaging Dg Chest 2 View  09/30/2014   CLINICAL DATA:  Larey Seat today, left-sided weakness and confusion, initial evaluation  EXAM: CHEST  2 VIEW  COMPARISON:  03/08/2014  FINDINGS: The heart size and vascular pattern are normal. No consolidation effusion or pneumothorax. Bony thorax is intact. Replacement aortic valve noted.  IMPRESSION: No active cardiopulmonary disease.   Electronically Signed   By: Esperanza Heir M.D.   On: 09/30/2014 11:37   Dg Cervical Spine Complete  09/30/2014   CLINICAL DATA:  Larey Seat today, initial evaluation, left-sided weakness  EXAM: CERVICAL SPINE  4+ VIEWS  COMPARISON:  None.  FINDINGS: There is no evidence of cervical spine fracture or prevertebral soft tissue swelling. Alignment is normal. No other significant bone abnormalities are identified.  IMPRESSION:  Negative cervical spine radiographs.   Electronically Signed   By: Esperanza Heir M.D.   On: 09/30/2014 11:34   Ct Head Wo Contrast  09/30/2014   CLINICAL DATA:  Patient fell at home, altered mental status, personal history of stroke, new onset of blindness  EXAM: CT HEAD WITHOUT CONTRAST  TECHNIQUE: Contiguous axial images were obtained from the base of the skull through the vertex without intravenous contrast.  COMPARISON:  03/08/2014  FINDINGS: There is diffuse atrophy. There is widespread encephalomalacia bilaterally including right frontal lobe, right parietal lobe, and right occipital lobe. On the  left, there is focal encephalomalacia in the anterior parietal lobe and in the occipital lobe. There is left cerebellar hemisphere encephalomalacia. There is diffuse low attenuation in the white matter. There are no new areas of abnormality. There is no hemorrhage or extra-axial fluid. There is no hydrocephalus. The calvarium is intact. There is intracranial internal carotid artery and bilateral vertebral artery calcification.  IMPRESSION: Extensive chronic encephalomalacia with no acute findings.   Electronically Signed   By: Esperanza Heir M.D.   On: 09/30/2014 11:16     ASSESSMENT / PLAN:  ENDOCRINE A:  DKA Hyperkalemia - 5.5 - monitor with hyperglycemia correction with insulin drip DM insulin dependent - hx of multiple admits and difficult management s/p CVA's in 2014 P:   With now a larger NON AG, need to avoid saline and change to 1/2 NS Monitor BMET q 2h until off drip Recognizing that gap is only 15 and remainder of etiology is NON AG  She had low glu values, hence need d5 still, once glu greater than 160 x 2, dc d5 in fluids and consider ssi  PULMONARY A: Tachypnea - Respiratory Alkalosis compensatory for metabolic acidosis due to DKA P:   Monitor O2 sats and pt clinically for respiratory fatigue/MS No follow abg required  CARDIOVASCULAR A:  Tachycardia r/t DKA Hypotension - soft BP CAD - neg trop Hx AVR with coumadin  - currently subtherapeutic P:  Tele monitoring  Hold heparin for possible upper GI bleed No active bleeding noted, and its valve, if in am hbg stable and no bleeding, then etiology would be stress induced from dka and would reastart coumadin direct Continued to give volume  RENAL A:   Metabolic acidosis - DKA GAP almost closed remained is NONAG  R/o RAT 4 and salin P:   Lactic acid, ketones Monitor UO May need bicarb if NONAG worsens despite avoiding saline q4h bmets  GASTROINTESTINAL A:  Coffee-ground emesis ?? UGI bleed resolved R/o  dyspahgia P:   Gastric occult blood pending See [facility] for likely coum restart plans PPI prophylaxis Sips and chips slp then advance if ok Amylase/lipase nonspecific  HEMATOLOGIC A:   Chronic anticoagulation - coumadin for AVR - currently subtherapeutic, with history of non-compliance Anemia - normocytic - acute blood loss due to GI bleed? P:  Hold heparin for possible upper GI bleed , likely in am restart SCD's for DVT prophylaxis CBC   INFECTIOUS A: Leukocytosis - WBC 23.1 with 20% bands - unknown etiology, possible septic cause of DKA?  CXR and UA reassuring, pct protocol impressive and fever R/o asp P:   I am convinced she has an infectious etiology Repeat pcxr U Aneg Follow pct again in am Aspiration>? cva in past? Add unasyn If sputum noted send If fever again, send BC  NEUROLOGIC A:   AMS - Metabolic Encephalopathy r/t DKA Hx of multiple CVA's Hx Neuropathy Hx depression/anxiety P:  Home med - lexapro Tramadol for mod pain Treat dka Ct neck neg, will clear clinically now , put colar back if pain noted  To sdu, triad  Mcarthur Rossetti. Tyson Alias, MD, FACP Pgr: [phone] [facility]

## 2014-10-01 NOTE — Progress Notes (Signed)
Notified Dr. Delton Coombes (E-link) of decrease in patient's urinary output (21mL in last 4 hours), patient's current CBG of 71, as well as K+ (3.6).  Orders received.  Will continue to monitor.

## 2014-10-01 NOTE — Plan of Care (Signed)
Problem: Phase I Progression Outcomes Goal: Acidosis resolving Outcome: Progressing

## 2014-10-01 NOTE — Evaluation (Signed)
Clinical/Bedside Swallow Evaluation Patient Details  Name: Madison [name] MRN: [id number] Date of Birth: 01-18-1961  Today's Date: 10/01/2014 Time: 1030-1042 SLP Time Calculation (min) (ACUTE ONLY): 12 min  Past Medical History:  Past Medical History  Diagnosis Date  . CAD (coronary artery disease)   . Obesity   . Hypercholesteremia   . HTN (hypertension)   . Dyslipidemia   . Stroke syndrome   . Aortic stenosis   . Thrombophlebitis   . Diabetes   . Hyperlipidemia   . Rheumatoid arthritis(714.0)    Past Surgical History:  Past Surgical History  Procedure Laterality Date  . Cesarean section    . Foot fracture surgery    . Knee surgery    . Neuroplasty / transposition median nerve at carpal tunnel     HPI:  53 y/o female with a complicated medical history of insulin-dependent DM, CAD, aortic valve replacement, multiple CVA's with loss of functional status which has complicated managing her multiple medical issues, admitted with AMS, fall and found to be in DKA. Dx Metabolic Encephalopathy r/t DKA, leukocytosis, ? GI bleed, hyperkalemia. CXR negative for acute process.  Pt followed by acute care and CIR SLP for mild dysphagia and cognitive deficits during 4/15 admission.     Assessment / Plan / Recommendation Clinical Impression  Pt presents with normal oropharyngeal swallow with prolonged but sufficient mastication (secondary to missing dentition), consistent swallow response, and on s/s of aspiration.  Recommend resuming a regular consistency diet with thin liquids - no SLP f/u is warranted.     Aspiration Risk  Mild    Diet Recommendation Regular;Thin liquid   Liquid Administration via: Cup;Straw Medication Administration: Whole meds with liquid Supervision: Patient able to self feed    Other  Recommendations Oral Care Recommendations: Oral care BID   Follow Up Recommendations  None           SLP Swallow Goals     Swallow Study Prior Functional Status        General Date of Onset: 09/30/14 HPI: 53 y/o female with a complicated medical history of insulin-dependent DM, CAD, aortic valve replacement, multiple CVA's with loss of functional status which has complicated managing her multiple medical issues, admitted with AMS, fall and found to be in DKA. Dx Metabolic Encephalopathy r/t DKA, leukocytosis, ? GI bleed, hyperkalemia. CXR negative for acute process.  Pt followed by acute care and CIR SLP for mild dysphagia and cognitive deficits during 4/15 admission.   Type of Study: Bedside swallow evaluation Previous Swallow Assessment: 4/15 admission Diet Prior to this Study: NPO Temperature Spikes Noted: No Respiratory Status: Room air History of Recent Intubation: No Behavior/Cognition: Alert;Cooperative Oral Cavity - Dentition: Missing dentition (no upper teeth - dentures not present) Self-Feeding Abilities: Able to feed self Patient Positioning: Upright in bed Baseline Vocal Quality: Clear Volitional Cough: Strong Volitional Swallow: Able to elicit    Oral/Motor/Sensory Function Overall Oral Motor/Sensory Function: Appears within functional limits for tasks assessed   Ice Chips Ice chips: Within functional limits Presentation: Self Fed;Spoon   Thin Liquid Thin Liquid: Within functional limits Presentation: Cup;Straw    Nectar Thick Nectar Thick Liquid: Not tested   Honey Thick Honey Thick Liquid: Not tested   Puree Puree: Within functional limits Presentation: Spoon   Solid  Haru Shaff L. [place], [place] CCC/SLP Pager [phone]     Solid: Within functional limits       Blenda Mounts Laurice 10/01/2014,10:49 AM

## 2014-10-01 NOTE — Plan of Care (Signed)
Problem: Phase I Progression Outcomes Goal: Monitor hydration status Outcome: Progressing     

## 2014-10-01 NOTE — Progress Notes (Signed)
ANTICOAGULATION CONSULT NOTE - Initial Consult  Pharmacy Consult for warfarin Indication: History of Aortic Valve Replacement  Allergies  Allergen Reactions  . Celebrex [Celecoxib] Rash  . Detrol [Tolterodine] Hives    Patient Measurements: Height: 5\' 2"  (157.5 cm) Weight: 166 lb 7.2 oz (75.5 kg) IBW/kg (Calculated) : 50.1  Vital Signs: Temp: 99.3 F (37.4 C) (11/13 1200) Temp Source: Core (Comment) (11/13 0600) BP: 119/58 mmHg (11/13 1200) Pulse Rate: 92 (11/13 1200)  Labs:  Recent Labs  09/30/14 1045 09/30/14 1431 09/30/14 1725  09/30/14 2230 10/01/14 0235 10/01/14 0608 10/01/14 1030  HGB 11.2*  --  11.3*  --  10.7* 10.2*  --   --   HCT 35.1*  --  34.0*  --  32.0* 30.9*  --   --   PLT 286  --  264  --  243 223  --   --   LABPROT 15.7*  --   --   --   --  21.4*  --   --   INR 1.24  --   --   --   --  1.84*  --   --   CREATININE  --   --  1.23*  < >  --  0.98 0.93 0.84  TROPONINI  --  <0.30  --   --   --   --   --   --   < > = values in this interval not displayed.  Estimated Creatinine Clearance: 73.7 mL/min (by C-G formula based on Cr of 0.84).   Medical History: Past Medical History  Diagnosis Date  . CAD (coronary artery disease)   . Obesity   . Hypercholesteremia   . HTN (hypertension)   . Dyslipidemia   . Stroke syndrome   . Aortic stenosis   . Thrombophlebitis   . Diabetes   . Hyperlipidemia   . Rheumatoid arthritis(714.0)     Medications:  Scheduled:  . ampicillin-sulbactam (UNASYN) IV  3 g Intravenous Q8H  . Chlorhexidine Gluconate Cloth  6 each Topical Q0600  . escitalopram  20 mg Oral Daily  . [START ON 10/02/2014] Influenza vac split quadrivalent PF  0.5 mL Intramuscular Tomorrow-1000  . insulin aspart  0-15 Units Subcutaneous TID WC  . insulin aspart  0-5 Units Subcutaneous QHS  . insulin glargine  30 Units Subcutaneous Daily  . mupirocin ointment  1 application Nasal BID  . pantoprazole (PROTONIX) IV  40 mg Intravenous Q24H     Assessment: 26 yoF with history of aortic valve replacement on warfarin presents with DKA, hematemesis, and possible aspiration PNA on Unasyn. Anticoagulation was being held due to possible GI bleed.  INR was 1.24 on admission, now 1.84.  Platelets and Hemoglobin has remained stable and no signs of bleeding have been noted. Home dose noted to be 5 mg daily except Wednesday in which it is held.  Since INR is subtherapeutic will give 1.5 X home dose and follow daily INR/CBC.  She is receiving antibiotic therapy and lexapro, which may impact the INR.  Goal of Therapy:  INR 2-3 Monitor platelets by anticoagulation protocol: Yes   Plan:  - Warfarin 7.5 mg X 1 - Monitor plts, H/H, signs of bleeding  Saturday, PharmD Clinical Pharmacist - Resident Pager: [phone] 11/13/20153:01 PM

## 2014-10-01 NOTE — Care Management Note (Unsigned)
    Page 1 of 2   10/04/2014     2:49:04 PM CARE MANAGEMENT NOTE 10/04/2014  Patient:  Madison [name], Madison [name]   Account Number:  [id number]  Date Initiated:  10/01/2014  Documentation initiated by:  Luz Lex  Subjective/Objective Assessment:   Admitted post gall with DKA. on Insulin drip.     Action/Plan:   Anticipated DC Date:  10/05/2014   Anticipated DC Plan:  [place]  CM consult      Choice offered to / List presented to:             Status of service:  In process, will continue to follow Medicare Important Message given?  YES (If response is "NO", the following Medicare IM given date fields will be blank) Date Medicare IM given:  10/04/2014 Medicare IM given by:  Tomi Bamberger Date Additional Medicare IM given:   Additional Medicare IM given by:    Discharge Disposition:    Per UR Regulation:  Reviewed for med. necessity/level of care/duration of stay  If discussed at [place] of Stay Meetings, dates discussed:    Comments:  ContactAshok Pall Sister [phone]  10/04/14 Sharolyn Douglas Spoke with attending & orders for HHC & f70fobtained. [facility] made for RN & HHA. Pt informed and contact numbers placed in [place]   10/04/14 kristin mcnally Met with pt to see if wanted HHC at dc. pt states she had HHA services via MA [facility], but it was dc about 3 weeks & she does not know why. This CM contacted [facility] & they stated pt let her MA lapse. [facility] & instructed pt to inform them that her MA lapsed. Pt [facility]able [facility] all choices for G[facility]. Choice for Advanced [facility]. Text page Dr. eIzora Galafor HWe[facility] Centerorders & [id number]  10/04/14 10Coral HillsN, BSN [phone] for possible dc later today.     10-01-14 12[place] LakeRNPitt[place] Centeralked with sister TrOlivia Mackieho is here visiting.  States  lives at home with 8 49ear old son.  Son found her on floor. To sister patient is confused - talking about break in and dead animals all over the floor and her voice is very soft. [facility] call and they said it is no longer paid for so not sure how long she has not had.  Given number to call about this.  Patient does not drive - sister usually takes her to get lab draws for INR but she has not driven her for 2 weeks so not sure if she has been getting this done.  States this is realy unusual for her sister.  Could use RN and maybe HHNA on discharge.  CM will continue to follow for needs.

## 2014-10-01 NOTE — Progress Notes (Addendum)
Nutrition Brief Note  Patient identified on the Malnutrition Screening Tool (MST) Report for weight loss of 50 lbs over the past 3 years. No weight loss noted over the past year.  Wt Readings from Last 15 Encounters:  10/01/14 166 lb 7.2 oz (75.5 kg)  07/20/14 164 lb (74.39 kg)  06/11/14 162 lb (73.483 kg)  06/10/14 163 lb (73.936 kg)  05/19/14 164 lb (74.39 kg)  04/28/14 154 lb (69.854 kg)  04/27/14 151 lb 9.6 oz (68.765 kg)  04/16/14 154 lb (69.854 kg)  03/20/14 147 lb 11.3 oz (67 kg)  03/12/14 163 lb 9.3 oz (74.2 kg)  01/11/14 155 lb (70.308 kg)  12/31/13 153 lb (69.4 kg)  12/16/13 153 lb (69.4 kg)  11/30/13 143 lb (64.864 kg)  10/23/13 141 lb (63.957 kg)    Body mass index is 30.44 kg/(m^2). Patient meets criteria for obesity, class 1 based on current BMI.   Current diet order is heart healthy CHO modified, diet just initiated. Labs and medications reviewed.   No nutrition interventions warranted at this time. If nutrition issues arise, please consult RD. Recommend OP diet education if needed.  Joaquin Courts, RD, LDN, CNSC Pager [phone] After Hours Pager [phone]

## 2014-10-01 NOTE — Progress Notes (Signed)
Pt transferred to 3s room 10 via bed and heart monitor. Eye glasses transferred with pt, no other belongings. Report given to [facility].

## 2014-10-01 NOTE — Plan of Care (Signed)
Problem: Phase I Progression Outcomes Goal: K+ level approaching normal with therapy Outcome: Progressing     

## 2014-10-01 NOTE — Plan of Care (Signed)
Problem: Consults Goal: Diabetic Ketoacidosis (DKA) Patient Education See Patient Education Modules for education specifics.  Outcome: Progressing     

## 2014-10-01 NOTE — Plan of Care (Signed)
Problem: Consults Goal: Skin Care Protocol Initiated - if Braden Score 18 or less If consults are not indicated, leave blank or document N/A Outcome: Completed/Met Date Met:  10/01/14

## 2014-10-01 NOTE — Progress Notes (Signed)
ANTIBIOTIC CONSULT NOTE - INITIAL  Pharmacy Consult for Unasyn  Indication: Aspiration PNA  Allergies  Allergen Reactions  . Celebrex [Celecoxib] Rash  . Detrol [Tolterodine] Hives    Patient Measurements: Height: 5\' 2"  (157.5 cm) Weight: 166 lb 7.2 oz (75.5 kg) IBW/kg (Calculated) : 50.1  Vital Signs: Temp: 98.8 F (37.1 C) (11/13 0600) Temp Source: Core (Comment) (11/13 0600) BP: 120/56 mmHg (11/13 0600) Pulse Rate: 98 (11/13 0600)  Labs:  Recent Labs  09/30/14 1725  09/30/14 2138 09/30/14 2230 10/01/14 0235 10/01/14 0608  WBC 27.0*  --   --  20.4* 18.1*  --   HGB 11.3*  --   --  10.7* 10.2*  --   PLT 264  --   --  243 223  --   CREATININE 1.23*  < > 1.08  --  0.98 0.93  < > = values in this interval not displayed.   Medical History: Past Medical History  Diagnosis Date  . CAD (coronary artery disease)   . Obesity   . Hypercholesteremia   . HTN (hypertension)   . Dyslipidemia   . Stroke syndrome   . Aortic stenosis   . Thrombophlebitis   . Diabetes   . Hyperlipidemia   . Rheumatoid arthritis(714.0)     Assessment: 53 y/o F here with AMS/fall/DKA, to start Unasyn for aspiration PNA, WBC 18.1, renal function ok, other labs as above.   Plan:  -Unasyn 3g IV q8h -Trend WBC, temp, renal function   40 10/01/2014,7:25 AM

## 2014-10-01 NOTE — Progress Notes (Signed)
LB PCCM  No evidence of bleeding, eating now, clinically much better  Resume warfarin per pharmacy Change fluids to 1/2 ns, no d5

## 2014-10-01 NOTE — Plan of Care (Signed)
Problem: Phase I Progression Outcomes Goal: Acidosis resolving Outcome: Progressing     

## 2014-10-01 NOTE — Progress Notes (Signed)
Patient admitted with DKA, aspiration, fall.  Takes Levemir 50 units every am at home.  Recommend starting Levemir when blood sugars are within range on insulin drip and Novolog SENSITIVE correction scale TID & HS when eating and every 4 hours while NPO.  Will continue to follow while in hospital.  Smith Mince RN BSN CDE

## 2014-10-01 NOTE — Progress Notes (Signed)
Pt's room assignment changed to 3s04 with Brynda Rim accepting pt.

## 2014-10-02 ENCOUNTER — Inpatient Hospital Stay (HOSPITAL_COMMUNITY): Payer: Medicare HMO

## 2014-10-02 DIAGNOSIS — E1310 Other specified diabetes mellitus with ketoacidosis without coma: Secondary | ICD-10-CM | POA: Diagnosis not present

## 2014-10-02 DIAGNOSIS — E0810 Diabetes mellitus due to underlying condition with ketoacidosis without coma: Secondary | ICD-10-CM

## 2014-10-02 DIAGNOSIS — R4182 Altered mental status, unspecified: Secondary | ICD-10-CM

## 2014-10-02 LAB — GLUCOSE, CAPILLARY
GLUCOSE-CAPILLARY: 144 mg/dL — AB (ref 70–99)
GLUCOSE-CAPILLARY: 28 mg/dL — AB (ref 70–99)
Glucose-Capillary: 167 mg/dL — ABNORMAL HIGH (ref 70–99)
Glucose-Capillary: 240 mg/dL — ABNORMAL HIGH (ref 70–99)
Glucose-Capillary: 127 mg/dL — ABNORMAL HIGH (ref 70–99)

## 2014-10-02 LAB — BASIC METABOLIC PANEL
Anion gap: 12 (ref 5–15)
BUN: 10 mg/dL (ref 6–23)
CALCIUM: 8.2 mg/dL — AB (ref 8.4–10.5)
CO2: 22 meq/L (ref 19–32)
Chloride: 108 mEq/L (ref 96–112)
Creatinine, Ser: 0.78 mg/dL (ref 0.50–1.10)
GFR calc Af Amer: >90 mL/min (ref >90)
GFR calc non Af Amer: >90 mL/min (ref >90)
GLUCOSE: 167 mg/dL — AB (ref 70–99)
Potassium: 3.7 mEq/L (ref 3.7–5.3)
SODIUM: 142 meq/L (ref 137–147)

## 2014-10-02 LAB — CBC WITH DIFFERENTIAL/PLATELET
BASOS PCT: 0 % (ref 0–1)
Basophils Absolute: 0 10*3/uL (ref 0.0–0.1)
EOS ABS: 0 10*3/uL (ref 0.0–0.7)
Eosinophils Relative: 0 % (ref 0–5)
HCT: 30.4 % — ABNORMAL LOW (ref 36.0–46.0)
Hemoglobin: 10.1 g/dL — ABNORMAL LOW (ref 12.0–15.0)
Lymphocytes Relative: 20 % (ref 12–46)
Lymphs Abs: 2.1 10*3/uL (ref 0.7–4.0)
MCH: 27.9 pg (ref 26.0–34.0)
MCHC: 33.2 g/dL (ref 30.0–36.0)
MCV: 84 fL (ref 78.0–100.0)
Monocytes Absolute: 0.3 10*3/uL (ref 0.1–1.0)
Monocytes Relative: 2 % — ABNORMAL LOW (ref 3–12)
NEUTROS PCT: 78 % — AB (ref 43–77)
Neutro Abs: 8.5 10*3/uL — ABNORMAL HIGH (ref 1.7–7.7)
PLATELETS: 174 10*3/uL (ref 150–400)
RBC: 3.62 MIL/uL — ABNORMAL LOW (ref 3.87–5.11)
RDW: 15.2 % (ref 11.5–15.5)
WBC: 10.9 10*3/uL — ABNORMAL HIGH (ref 4.0–10.5)

## 2014-10-02 LAB — COMPREHENSIVE METABOLIC PANEL
ALK PHOS: 78 U/L (ref 39–117)
ALT: 10 U/L (ref 0–35)
AST: 13 U/L (ref 0–37)
Albumin: 2.5 g/dL — ABNORMAL LOW (ref 3.5–5.2)
Anion gap: 13 (ref 5–15)
BILIRUBIN TOTAL: 0.3 mg/dL (ref 0.3–1.2)
BUN: 10 mg/dL (ref 6–23)
CHLORIDE: 108 meq/L (ref 96–112)
CO2: 22 meq/L (ref 19–32)
Calcium: 8.3 mg/dL — ABNORMAL LOW (ref 8.4–10.5)
Creatinine, Ser: 0.73 mg/dL (ref 0.50–1.10)
Glucose, Bld: 192 mg/dL — ABNORMAL HIGH (ref 70–99)
Potassium: 3.4 mEq/L — ABNORMAL LOW (ref 3.7–5.3)
SODIUM: 143 meq/L (ref 137–147)
Total Protein: 5.5 g/dL — ABNORMAL LOW (ref 6.0–8.3)

## 2014-10-02 LAB — PROTIME-INR
INR: 2.9 — ABNORMAL HIGH (ref 0.00–1.49)
PROTHROMBIN TIME: 30.5 s — AB (ref 11.6–15.2)

## 2014-10-02 LAB — PROCALCITONIN: PROCALCITONIN: 7.72 ng/mL

## 2014-10-02 LAB — CLOSTRIDIUM DIFFICILE BY PCR: Toxigenic C. Difficile by PCR: NEGATIVE

## 2014-10-02 LAB — APTT: APTT: 41 s — AB (ref 24–37)

## 2014-10-02 MED ORDER — POTASSIUM CHLORIDE CRYS ER 20 MEQ PO TBCR
40.0000 meq | EXTENDED_RELEASE_TABLET | Freq: Once | ORAL | Status: AC
  Administered 2014-10-02: 40 meq via ORAL
  Filled 2014-10-02: qty 2

## 2014-10-02 MED ORDER — DEXTROSE 50 % IV SOLN
INTRAVENOUS | Status: AC
  Administered 2014-10-02: 50 mL
  Filled 2014-10-02: qty 50

## 2014-10-02 MED ORDER — PANTOPRAZOLE SODIUM 40 MG PO TBEC
40.0000 mg | DELAYED_RELEASE_TABLET | Freq: Every day | ORAL | Status: DC
  Administered 2014-10-02 – 2014-10-03 (×2): 40 mg via ORAL
  Filled 2014-10-02 (×2): qty 1

## 2014-10-02 NOTE — Progress Notes (Addendum)
ANTICOAGULATION CONSULT NOTE - Follow Up Consult  Pharmacy Consult for Warfarin Indication: History of Aortic Valve Replacement  Allergies  Allergen Reactions  . Celebrex [Celecoxib] Rash  . Detrol [Tolterodine] Hives    Patient Measurements: Height: 5\' 3"  (160 cm) Weight: 166 lb 7.2 oz (75.5 kg) IBW/kg (Calculated) : 52.4  Vital Signs: Temp: 98.7 F (37.1 C) (11/14 0345) Temp Source: Oral (11/14 0345) BP: 122/50 mmHg (11/14 0600) Pulse Rate: 89 (11/14 0600)  Labs:  Recent Labs  09/30/14 1045 09/30/14 1431  09/30/14 2230 10/01/14 0235  10/01/14 2238 10/02/14 0303 10/02/14 0635  HGB 11.2*  --   < > 10.7* 10.2*  --   --   --  10.1*  HCT 35.1*  --   < > 32.0* 30.9*  --   --   --  30.4*  PLT 286  --   < > 243 223  --   --   --  174  APTT  --   --   --   --   --   --   --   --  41*  LABPROT 15.7*  --   --   --  21.4*  --   --   --  30.5*  INR 1.24  --   --   --  1.84*  --   --   --  2.90*  CREATININE  --   --   < >  --  0.98  < > 0.75 0.78 0.73  TROPONINI  --  <0.30  --   --   --   --   --   --   --   < > = values in this interval not displayed.  Estimated Creatinine Clearance: 79.1 mL/min (by C-G formula based on Cr of 0.73).   Medications:  Scheduled:  . ampicillin-sulbactam (UNASYN) IV  3 g Intravenous Q8H  . Chlorhexidine Gluconate Cloth  6 each Topical Q0600  . escitalopram  20 mg Oral Daily  . Influenza vac split quadrivalent PF  0.5 mL Intramuscular Tomorrow-1000  . insulin aspart  0-15 Units Subcutaneous TID WC  . insulin aspart  0-5 Units Subcutaneous QHS  . insulin glargine  30 Units Subcutaneous Daily  . mupirocin ointment  1 application Nasal BID  . pantoprazole (PROTONIX) IV  40 mg Intravenous Q24H  . Warfarin - Pharmacist Dosing Inpatient   Does not apply q1800    Assessment: 41 yoF with history of aortic valve replacement on warfarin. On admission INR was subtherapeutic at 1.24 and anticoagulation was being held due to possible GI bleed.  Warfarin restarted on 11/13 and INR was 1.84.She received 1 dose of 7.5 mg and INR trended up significantly to 2.90. Platelets and Hemoglobin have remained stable and no signs of bleeding have been noted. Since INR trended up greatly from one dose of warfarin, will hold dose tonight and reassess on 11/15. She is receiving antibiotic therapy and lexapro, which may be attributing to her unstable INR.  Goal of Therapy:  INR 2-3 Monitor platelets by anticoagulation protocol: Yes   Plan:  - Hold Warfarin X 1 dose - Daily PT/INR - Monitor plts, H/H, signs of bleeding  12/15, PharmD Clinical Pharmacist - Resident Pager: [phone] 11/14/20158:38 AM

## 2014-10-02 NOTE — Progress Notes (Addendum)
Patient Demographics  Madison [name], is a 52 y.o. female, DOB - 05-21-1961, IHK:[id number]  Admit date - 09/30/2014   Admitting Physician Hollice Espy, MD  Outpatient Primary MD for the patient is CAMPBELL, PADONDA BOYD, FNP  LOS - 2   Chief Complaint  Patient presents with  . Fall  . Altered Mental Status      Admission history of present illness/brief narrative: SHEENT was admitted on 09/30/14 critical care, as she was found at home on the floor, with vomiting ( possible coffee-ground emesis) , patient was found to be hypotensive, tachypneic, in DKA, was admitted initially to critical care, patient was started on insulin drip, for her DKA, anion gap closed, patient has transitioned to Lantus with insulin sliding scale, she is tolerating oral intake, patient had no further episodes of coffee-ground emesis during hospitalization, tolerated oral intake very well, hemoglobin remaines stable, she is resume back on her warfarin for AVR.  Subjective:   Madison [name] today has, No headache, No chest pain, No abdominal pain - No Nausea, No new weakness tingling or numbness, No Cough - SOB.   Assessment & Plan    Active Problems:   DKA (diabetic ketoacidoses)   Altered mental status   Sinus tachycardia   Aspiration into airway  DKA -urine gap closed, agent is tolerating oral intake -annual Lantus, and insulin sliding scale, blood glucose better controlled.  Acute encephalopathy -Resolved, back to baseline, as well as most likely metabolic encephalopathy regarding ear DKA  Vomiting/questionable coffee-ground emesis -No further episodes during hospitalization, hemoglobin has been stable since admission(baseline as an outpatient 11.9 , pain is around 10 during this hospitalization) -continue with PPI.  Leukocytosis -Most likely reactive secondary to DKA, a  febrile, negative urinalysis, and chest x-ray,Propulsid and continues to trend down. -Resolved, will stop antibiotics,  AVR -continue with warfarin for target INR 2.5-3.5, pharmacy to dose.   Code Status: Full  Family Communication: patient is alert and oriented  Disposition Plan: Remains and stepdown   Procedures  none   Consults   Admitted to pulmonary/critical care   Medications  Scheduled Meds: . ampicillin-sulbactam (UNASYN) IV  3 g Intravenous Q8H  . Chlorhexidine Gluconate Cloth  6 each Topical Q0600  . escitalopram  20 mg Oral Daily  . Influenza vac split quadrivalent PF  0.5 mL Intramuscular Tomorrow-1000  . insulin aspart  0-15 Units Subcutaneous TID WC  . insulin aspart  0-5 Units Subcutaneous QHS  . insulin glargine  30 Units Subcutaneous Daily  . mupirocin ointment  1 application Nasal BID  . pantoprazole  40 mg Oral Daily  . Warfarin - Pharmacist Dosing Inpatient   Does not apply q1800   Continuous Infusions: . sodium chloride 50 mL/hr at 10/01/14 1300   PRN Meds:.traMADol  DVT Prophylaxis   On warfarin  Lab Results  Component Value Date   PLT 174 10/02/2014    Antibiotics   Anti-infectives    Start     Dose/Rate Route Frequency Ordered Stop   10/01/14 0730  Ampicillin-Sulbactam (UNASYN) 3 g in sodium chloride 0.9 % 100 mL IVPB     3 g100 mL/hr over 60 Minutes Intravenous Every 8 hours 10/01/14 0729  Objective:   Filed Vitals:   10/02/14 0500 10/02/14 0600 10/02/14 0745 10/02/14 0851  BP: 131/58 122/50    Pulse: 84 89  83  Temp:   98.6 F (37 C)   TempSrc:   Oral   Resp: 18 19  17   Height:      Weight:      SpO2: 98% 94%  99%    Wt Readings from Last 3 Encounters:  10/01/14 75.5 kg (166 lb 7.2 oz)  07/20/14 74.39 kg (164 lb)  06/11/14 73.483 kg (162 lb)     Intake/Output Summary (Last 24 hours) at 10/02/14 1138 Last data filed at 10/02/14 0800  Gross per 24 hour  Intake 1717.5 ml  Output    925 ml  Net   792.5 ml     Physical Exam  Awake Alert, Oriented X 3, No new F.N deficits, Normal affect .AT,PERRAL Supple Neck,No JVD, No cervical lymphadenopathy appriciated.  Symmetrical Chest wall movement, Good air movement bilaterally, CTAB RRR,No Gallops,Rubs or new Murmurs, No Parasternal Heave +ve B.Sounds, Abd Soft, No tenderness, No organomegaly appriciated, No rebound - guarding or rigidity. No Cyanosis, Clubbing or edema, No new Rash or bruise     Data Review   Micro Results Recent Results (from the past 240 hour(s))  MRSA PCR Screening     Status: Abnormal   Collection Time: 09/30/14  4:36 PM  Result Value Ref Range Status   MRSA by PCR POSITIVE (A) NEGATIVE Final    Comment:        The GeneXpert MRSA Assay (FDA approved for NASAL specimens only), is one component of a comprehensive MRSA colonization surveillance program. It is not intended to diagnose MRSA infection nor to guide or monitor treatment for MRSA infections. RESULT CALLED TO, READ BACK BY AND VERIFIED WITH: S.MYERS,RN AT 2100 BY L.PITT 09/30/14   Culture, Urine     Status: None   Collection Time: 09/30/14  5:49 PM  Result Value Ref Range Status   Specimen Description URINE, CATHETERIZED  Final   Special Requests NONE  Final   Culture  Setup Time   Final    09/30/2014 21:21 Performed at 13/10/2014    Colony Count NO GROWTH Performed at [facility]   Final   Culture NO GROWTH Performed at [facility]   Final   Report Status 10/01/2014 FINAL  Final    Radiology Reports Dg Chest Port 1 View  10/02/2014   CLINICAL DATA:  Aspiration.  EXAM: PORTABLE CHEST - 1 VIEW  COMPARISON:  Two-view chest 09/30/2014.  FINDINGS: The heart size is normal. Patient is status post median sternotomy for valve replacement. The lungs are clear. The visualized soft tissues and bony thorax are unremarkable.  IMPRESSION: Negative two view chest.   Electronically Signed   By: 13/10/2014 M.D.    On: 10/02/2014 09:12    CBC  Recent Labs Lab 09/30/14 1045 09/30/14 1725 09/30/14 2230 10/01/14 0235 10/02/14 0635  WBC 23.1* 27.0* 20.4* 18.1* 10.9*  HGB 11.2* 11.3* 10.7* 10.2* 10.1*  HCT 35.1* 34.0* 32.0* 30.9* 30.4*  PLT 286 264 243 223 174  MCV 86.5 83.5 83.1 84.7 84.0  MCH 27.6 27.8 27.8 27.9 27.9  MCHC 31.9 33.2 33.4 33.0 33.2  RDW 15.0 14.8 14.9 14.9 15.2  LYMPHSABS 2.3  --   --   --  2.1  MONOABS 1.4*  --   --   --  0.3  EOSABS 0.0  --   --   --  0.0  BASOSABS 0.0  --   --   --  0.0    Chemistries   Recent Labs Lab 10/01/14 1608 10/01/14 1837 10/01/14 2238 10/02/14 0303 10/02/14 0635  NA 139 140 143 142 143  K 4.2 3.9 4.0 3.7 3.4*  CL 107 108 109 108 108  CO2 19 17* 19 22 22   GLUCOSE 287* 232* 152* 167* 192*  BUN 15 14 12 10 10   CREATININE 0.80 0.77 0.75 0.78 0.73  CALCIUM 8.3* 8.6 8.5 8.2* 8.3*  AST  --   --   --   --  13  ALT  --   --   --   --  10  ALKPHOS  --   --   --   --  78  BILITOT  --   --   --   --  0.3   ------------------------------------------------------------------------------------------------------------------ estimated creatinine clearance is 79.1 mL/min (by C-G formula based on Cr of 0.73). ------------------------------------------------------------------------------------------------------------------ No results for input(s): HGBA1C in the last 72 hours. ------------------------------------------------------------------------------------------------------------------ No results for input(s): CHOL, HDL, LDLCALC, TRIG, CHOLHDL, LDLDIRECT in the last 72 hours. ------------------------------------------------------------------------------------------------------------------ No results for input(s): TSH, T4TOTAL, T3FREE, THYROIDAB in the last 72 hours.  Invalid input(s): FREET3 ------------------------------------------------------------------------------------------------------------------ No results for input(s): VITAMINB12,  FOLATE, FERRITIN, TIBC, IRON, RETICCTPCT in the last 72 hours.  Coagulation profile  Recent Labs Lab 09/30/14 1045 10/01/14 0235 10/02/14 0635  INR 1.24 1.84* 2.90*    No results for input(s): DDIMER in the last 72 hours.  Cardiac Enzymes  Recent Labs Lab 09/30/14 1431  TROPONINI <0.30   ------------------------------------------------------------------------------------------------------------------ Invalid input(s): POCBNP     Time Spent in minutes   25 minutes   ELGERGAWY, DAWOOD M.D on 10/02/2014 at 11:38 AM  Between 7am to 7pm - Pager - [phone]  After 7pm go to [email or web address] - password [email or web address]  And look for the night coverage person covering for me after hours  [facility] Office  [phone]   **Disclaimer: This note may have been dictated with voice recognition software. Similar sounding words can inadvertently be transcribed and this note may contain transcription errors which may not have been corrected upon publication of note.**

## 2014-10-03 LAB — CBC
HCT: 30.1 % — ABNORMAL LOW (ref 36.0–46.0)
Hemoglobin: 10.1 g/dL — ABNORMAL LOW (ref 12.0–15.0)
MCH: 28.3 pg (ref 26.0–34.0)
MCHC: 33.6 g/dL (ref 30.0–36.0)
MCV: 84.3 fL (ref 78.0–100.0)
PLATELETS: 157 10*3/uL (ref 150–400)
RBC: 3.57 MIL/uL — AB (ref 3.87–5.11)
RDW: 15 % (ref 11.5–15.5)
WBC: 7.2 10*3/uL (ref 4.0–10.5)

## 2014-10-03 LAB — GLUCOSE, CAPILLARY
GLUCOSE-CAPILLARY: 247 mg/dL — AB (ref 70–99)
Glucose-Capillary: 78 mg/dL (ref 70–99)
Glucose-Capillary: 111 mg/dL — ABNORMAL HIGH (ref 70–99)
Glucose-Capillary: 166 mg/dL — ABNORMAL HIGH (ref 70–99)
Glucose-Capillary: 207 mg/dL — ABNORMAL HIGH (ref 70–99)

## 2014-10-03 LAB — BASIC METABOLIC PANEL
Anion gap: 8 (ref 5–15)
BUN: 7 mg/dL (ref 6–23)
CALCIUM: 8.3 mg/dL — AB (ref 8.4–10.5)
CHLORIDE: 108 meq/L (ref 96–112)
CO2: 24 mEq/L (ref 19–32)
CREATININE: 0.7 mg/dL (ref 0.50–1.10)
GFR calc non Af Amer: >90 mL/min (ref >90)
Glucose, Bld: 97 mg/dL (ref 70–99)
Potassium: 3.2 mEq/L — ABNORMAL LOW (ref 3.7–5.3)
Sodium: 140 mEq/L (ref 137–147)

## 2014-10-03 LAB — PROTIME-INR
INR: 2.65 — ABNORMAL HIGH (ref 0.00–1.49)
PROTHROMBIN TIME: 28.4 s — AB (ref 11.6–15.2)

## 2014-10-03 MED ORDER — INSULIN GLARGINE 100 UNIT/ML ~~LOC~~ SOLN
20.0000 [IU] | Freq: Every day | SUBCUTANEOUS | Status: DC
  Administered 2014-10-03 – 2014-10-04 (×2): 20 [IU] via SUBCUTANEOUS
  Filled 2014-10-03: qty 0.2

## 2014-10-03 MED ORDER — WARFARIN SODIUM 5 MG PO TABS
5.0000 mg | ORAL_TABLET | Freq: Once | ORAL | Status: AC
  Administered 2014-10-03: 5 mg via ORAL
  Filled 2014-10-03 (×2): qty 1

## 2014-10-03 MED ORDER — INSULIN GLARGINE 100 UNIT/ML ~~LOC~~ SOLN
25.0000 [IU] | Freq: Every day | SUBCUTANEOUS | Status: DC
  Filled 2014-10-03: qty 0.25

## 2014-10-03 MED ORDER — FLUCONAZOLE 40 MG/ML PO SUSR
100.0000 mg | Freq: Every day | ORAL | Status: DC
  Administered 2014-10-04: 100 mg via ORAL
  Filled 2014-10-03: qty 2.5

## 2014-10-03 MED ORDER — FLUCONAZOLE IN SODIUM CHLORIDE 200-0.9 MG/100ML-% IV SOLN
200.0000 mg | Freq: Once | INTRAVENOUS | Status: AC
  Administered 2014-10-03: 200 mg via INTRAVENOUS
  Filled 2014-10-03: qty 100

## 2014-10-03 MED ORDER — GI COCKTAIL ~~LOC~~
30.0000 mL | Freq: Three times a day (TID) | ORAL | Status: DC | PRN
  Filled 2014-10-03: qty 30

## 2014-10-03 MED ORDER — POTASSIUM CHLORIDE CRYS ER 20 MEQ PO TBCR
40.0000 meq | EXTENDED_RELEASE_TABLET | Freq: Two times a day (BID) | ORAL | Status: AC
  Administered 2014-10-03 – 2014-10-04 (×3): 40 meq via ORAL
  Filled 2014-10-03 (×3): qty 2

## 2014-10-03 NOTE — Progress Notes (Addendum)
ANTICOAGULATION CONSULT NOTE - Follow Up Consult  Pharmacy Consult for Warfarin Indication: History of Aortic Valve Replacement  Allergies  Allergen Reactions  . Celebrex [Celecoxib] Rash  . Detrol [Tolterodine] Hives    Patient Measurements: Height: 5\' 3"  (160 cm) Weight: 166 lb 7.2 oz (75.5 kg) IBW/kg (Calculated) : 52.4  Vital Signs: Temp: 97.9 F (36.6 C) (11/15 0806) Temp Source: Oral (11/15 0806) BP: 124/49 mmHg (11/15 0806) Pulse Rate: 70 (11/15 0806)  Labs:  Recent Labs  09/30/14 1431  10/01/14 0235  10/02/14 0303 10/02/14 0635 10/03/14 0230  HGB  --   < > 10.2*  --   --  10.1* 10.1*  HCT  --   < > 30.9*  --   --  30.4* 30.1*  PLT  --   < > 223  --   --  174 157  APTT  --   --   --   --   --  41*  --   LABPROT  --   --  21.4*  --   --  30.5* 28.4*  INR  --   --  1.84*  --   --  2.90* 2.65*  CREATININE  --   < > 0.98  < > 0.78 0.73 0.70  TROPONINI <0.30  --   --   --   --   --   --   < > = values in this interval not displayed.  Estimated Creatinine Clearance: 79.1 mL/min (by C-G formula based on Cr of 0.7).   Assessment: 38 yoF with history of aortic valve replacement on warfarin. Warfarin was held on 11/14 due to large increase in INR from 1.84 to 2.90 after one dose of 7.5 mg. Today her INR is 2.65. Platelets and Hemoglobin are stable and no signs of bleeding have been noted. Clarified with MD that her goal is 2.5 to 3.5. Her INR may be labile due to her inconsistent diet while inpatient. Will give 5 mg X 1 tonight.   Goal of Therapy:  INR 2.5-3.5 Monitor platelets by anticoagulation protocol: Yes   Plan:  - Warfarin 5 mg X 1 - Daily PT/INR  - Monitor plts, H/H, signs of bleeding   12/14, PharmD Clinical Pharmacist - Resident Pager: [phone] 11/15/20158:20 AM   MD aware of drug/drug interaction with fluconazole which he has added to this patients regimen today.  Mechanism of interaction noted: Fluconazole may decrease hepatic  metabolism and increase the hypoprothrombinemic effect of warfarin. Bleeding may occur. Plan is to monitor closely for bleeding risk potential.  10/05/2014, PharmD., MS Pager:  [phone] Thank you for allowing pharmacy to be part of this patients care team.

## 2014-10-03 NOTE — Progress Notes (Signed)
Patient Demographics  Madison [name], is a 53 y.o. female, DOB - 1961-05-19, OMB:[id number]  Admit date - 09/30/2014   Admitting Physician Hollice Espy, MD  Outpatient Primary MD for the patient is CAMPBELL, PADONDA BOYD, FNP  LOS - 3   Chief Complaint  Patient presents with  . Fall  . Altered Mental Status      Admission history of present illness/brief narrative: Patient was admitted on 09/30/14 to critical care, as she was found at home on the floor, with vomiting ( possible coffee-ground emesis) , patient was found to be hypotensive, tachypneic, in DKA, was admitted initially to critical care, patient was started on insulin drip, for her DKA, anion gap closed, patient has transitioned to Lantus with insulin sliding scale, she is tolerating oral intake, patient had no further episodes of coffee-ground emesis during hospitalization, tolerated oral intake very well, hemoglobin remaines stable, she is resume back on her warfarin for AVR.patient had an episode of hypoglycemia on 10/02/14, she relates that to poor oral intake secondary to odynophagia.  Subjective:   Madison [name] today has, No headache, No chest pain, No abdominal pain - No Nausea, No new weakness tingling or numbness, No Cough - SOB. Patient had an episode of hypoglycemia overnight, plans of odynophagia.  Assessment & Plan    Active Problems:   DKA (diabetic ketoacidoses)   Altered mental status   Sinus tachycardia   Aspiration into airway  DKA -aniongap closed, agent is tolerating oral intake -resume Lantus, and insulin sliding scale, blood glucose better controlled. -had an episode of hypoglycemia 10/02/14 evening, Lantus was increased from 30 units to 20 units subcutaneous daily.  Acute encephalopathy -Resolved, back to baseline, as well as most likely metabolic encephalopathy regarding  ear DKA  Vomiting/questionable coffee-ground emesis -No further episodes during hospitalization, hemoglobin has been stable since admission(baseline as an outpatient 11.9 , pain is around 10 during this hospitalization) -continue with PPI.  odynophagia -Reports started during this hospitalization after vomiting, GI consulted -started on Diflucan for possible candidal esophagitis, discussed with GI.  Leukocytosis -Most likely reactive secondary to DKA, a febrile, negative urinalysis, and chest x-ray,Propulsid and continues to trend down. -Resolved, will stop antibiotics,  AVR -continue with warfarin for target INR 2.5-3.5, pharmacy to dose.   Code Status: Full  Family Communication: patient is alert and oriented  Disposition Plan: transferred to telemetry    Procedures  none   Consults   Admitted to pulmonary/critical care   Medications  Scheduled Meds: . Chlorhexidine Gluconate Cloth  6 each Topical Q0600  . escitalopram  20 mg Oral Daily  . [START ON 10/04/2014] fluconazole  100 mg Oral Daily  . fluconazole (DIFLUCAN) IV  200 mg Intravenous Once  . insulin aspart  0-15 Units Subcutaneous TID WC  . insulin aspart  0-5 Units Subcutaneous QHS  . insulin glargine  20 Units Subcutaneous Daily  . mupirocin ointment  1 application Nasal BID  . pantoprazole  40 mg Oral Daily  . potassium chloride  40 mEq Oral BID  . warfarin  5 mg Oral ONCE-1800  . Warfarin - Pharmacist Dosing Inpatient   Does not apply q1800   Continuous Infusions: . sodium chloride 50 mL/hr at 10/02/14 1303   PRN  Meds:.gi cocktail, traMADol  DVT Prophylaxis   On warfarin  Lab Results  Component Value Date   PLT 157 10/03/2014    Antibiotics   Anti-infectives    Start     Dose/Rate Route Frequency Ordered Stop   10/04/14 1000  fluconazole (DIFLUCAN) 40 MG/ML suspension 100 mg     100 mg Oral Daily 10/03/14 1039     10/03/14 1045  fluconazole (DIFLUCAN) IVPB 200 mg     200 mg100 mL/hr over  60 Minutes Intravenous  Once 10/03/14 1039     10/01/14 0730  Ampicillin-Sulbactam (UNASYN) 3 g in sodium chloride 0.9 % 100 mL IVPB  Status:  Discontinued     3 g100 mL/hr over 60 Minutes Intravenous Every 8 hours 10/01/14 0729 10/02/14 1153          Objective:   Filed Vitals:   10/02/14 2324 10/03/14 0412 10/03/14 0436 10/03/14 0806  BP: 112/65 100/60  124/49  Pulse: 76 71 66 70  Temp: 98.2 F (36.8 C) 98 F (36.7 C)  97.9 F (36.6 C)  TempSrc: Oral Oral  Oral  Resp: 18 19 12 18   Height:      Weight:      SpO2: 99% 99% 100% 100%    Wt Readings from Last 3 Encounters:  10/01/14 75.5 kg (166 lb 7.2 oz)  07/20/14 74.39 kg (164 lb)  06/11/14 73.483 kg (162 lb)     Intake/Output Summary (Last 24 hours) at 10/03/14 1040 Last data filed at 10/03/14 0900  Gross per 24 hour  Intake   1350 ml  Output    900 ml  Net    450 ml     Physical Exam  Awake Alert, Oriented X 3, No new F.N deficits, Normal affect [place].AT,PERRAL Supple Neck,No JVD, No cervical lymphadenopathy appriciated.  Symmetrical Chest wall movement, Good air movement bilaterally, CTAB RRR,No Gallops,Rubs or new Murmurs, No Parasternal Heave +ve B.Sounds, Abd Soft, No tenderness, No organomegaly appriciated, No rebound - guarding or rigidity. No Cyanosis, Clubbing or edema, No new Rash or bruise     Data Review   Micro Results Recent Results (from the past 240 hour(s))  MRSA PCR Screening     Status: Abnormal   Collection Time: 09/30/14  4:36 PM  Result Value Ref Range Status   MRSA by PCR POSITIVE (A) NEGATIVE Final    Comment:        The GeneXpert MRSA Assay (FDA approved for NASAL specimens only), is one component of a comprehensive MRSA colonization surveillance program. It is not intended to diagnose MRSA infection nor to guide or monitor treatment for MRSA infections. RESULT CALLED TO, READ BACK BY AND VERIFIED WITH: S.MYERS,RN AT 2100 BY L.PITT 09/30/14   Culture, Urine     Status:  None   Collection Time: 09/30/14  5:49 PM  Result Value Ref Range Status   Specimen Description URINE, CATHETERIZED  Final   Special Requests NONE  Final   Culture  Setup Time   Final    09/30/2014 21:21 Performed at 13/10/2014    Colony Count NO GROWTH Performed at [facility]   Final   Culture NO GROWTH Performed at [facility]   Final   Report Status 10/01/2014 FINAL  Final  Clostridium Difficile by PCR     Status: None   Collection Time: 10/02/14  4:42 PM  Result Value Ref Range Status   C difficile by pcr NEGATIVE NEGATIVE Final  Radiology Reports Dg Chest Port 1 View  10/02/2014   CLINICAL DATA:  Aspiration.  EXAM: PORTABLE CHEST - 1 VIEW  COMPARISON:  Two-view chest 09/30/2014.  FINDINGS: The heart size is normal. Patient is status post median sternotomy for valve replacement. The lungs are clear. The visualized soft tissues and bony thorax are unremarkable.  IMPRESSION: Negative two view chest.   Electronically Signed   By: Gennette Pac M.D.   On: 10/02/2014 09:12    CBC  Recent Labs Lab 09/30/14 1045 09/30/14 1725 09/30/14 2230 10/01/14 0235 10/02/14 0635 10/03/14 0230  WBC 23.1* 27.0* 20.4* 18.1* 10.9* 7.2  HGB 11.2* 11.3* 10.7* 10.2* 10.1* 10.1*  HCT 35.1* 34.0* 32.0* 30.9* 30.4* 30.1*  PLT 286 264 243 223 174 157  MCV 86.5 83.5 83.1 84.7 84.0 84.3  MCH 27.6 27.8 27.8 27.9 27.9 28.3  MCHC 31.9 33.2 33.4 33.0 33.2 33.6  RDW 15.0 14.8 14.9 14.9 15.2 15.0  LYMPHSABS 2.3  --   --   --  2.1  --   MONOABS 1.4*  --   --   --  0.3  --   EOSABS 0.0  --   --   --  0.0  --   BASOSABS 0.0  --   --   --  0.0  --     Chemistries   Recent Labs Lab 10/01/14 1837 10/01/14 2238 10/02/14 0303 10/02/14 0635 10/03/14 0230  NA 140 143 142 143 140  K 3.9 4.0 3.7 3.4* 3.2*  CL 108 109 108 108 108  CO2 17* 19 22 22 24   GLUCOSE 232* 152* 167* 192* 97  BUN 14 12 10 10 7   CREATININE 0.77 0.75 0.78 0.73 0.70  CALCIUM 8.6 8.5 8.2* 8.3*  8.3*  AST  --   --   --  13  --   ALT  --   --   --  10  --   ALKPHOS  --   --   --  78  --   BILITOT  --   --   --  0.3  --    ------------------------------------------------------------------------------------------------------------------ estimated creatinine clearance is 79.1 mL/min (by C-G formula based on Cr of 0.7). ------------------------------------------------------------------------------------------------------------------ No results for input(s): HGBA1C in the last 72 hours. ------------------------------------------------------------------------------------------------------------------ No results for input(s): CHOL, HDL, LDLCALC, TRIG, CHOLHDL, LDLDIRECT in the last 72 hours. ------------------------------------------------------------------------------------------------------------------ No results for input(s): TSH, T4TOTAL, T3FREE, THYROIDAB in the last 72 hours.  Invalid input(s): FREET3 ------------------------------------------------------------------------------------------------------------------ No results for input(s): VITAMINB12, FOLATE, FERRITIN, TIBC, IRON, RETICCTPCT in the last 72 hours.  Coagulation profile  Recent Labs Lab 09/30/14 1045 10/01/14 0235 10/02/14 0635 10/03/14 0230  INR 1.24 1.84* 2.90* 2.65*    No results for input(s): DDIMER in the last 72 hours.  Cardiac Enzymes  Recent Labs Lab 09/30/14 1431  TROPONINI <0.30   ------------------------------------------------------------------------------------------------------------------ Invalid input(s): POCBNP     Time Spent in minutes   25 minutes   ELGERGAWY, DAWOOD M.D on 10/03/2014 at 10:40 AM  Between 7am to 7pm - Pager - [phone]  After 7pm go to [email or web address] - password [email or web address]  And look for the night coverage person covering for me after hours  [facility] Office  [phone]   **Disclaimer: This note may have been dictated with voice recognition  software. Similar sounding words can inadvertently be transcribed and this note may contain transcription errors which may not have been corrected upon publication of note.**

## 2014-10-03 NOTE — Progress Notes (Signed)
Pt to TX to 5W-16, VSS, called report.

## 2014-10-03 NOTE — Progress Notes (Signed)
Hypoglycemic Event  CBG: 28  Treatment: D50 IV 50 mL  Symptoms: None  Follow-up CBG: Time:2220 CBG Result:127  Possible Reasons for Event: Inadequate meal intake  Comments/MD notified:    Madison [name]  Remember to initiate Hypoglycemia Order Set & complete

## 2014-10-03 NOTE — Consult Note (Signed)
Consult for [facility]  Reason for Consult: Odynophagia, Dysphagia, and coffee-ground emesis Referring Physician: [facility]  Madison [name] HPI: This is a 53 year old female admitted for AMS secondary to DKA.  She apparently had issues with some type of infectious illness, which preciptated her DKA.  When she was found down some coffee-ground emesis was noted around her mouth.  She does take coumadin.  Controlling her blood sugar and IV hydration restored her mental status, however, she complains of severe odynophagia since her hospitalization.  She was able to tolerate some PO, but she states that odynophagia has been a problem for her.  No further issues with coffee-ground emesis.  Her HGB is at 10 and it appears her baseline is in the 11 range.  Past Medical History  Diagnosis Date  . CAD (coronary artery disease)   . Obesity   . Hypercholesteremia   . HTN (hypertension)   . Dyslipidemia   . Stroke syndrome   . Aortic stenosis   . Thrombophlebitis   . Diabetes   . Hyperlipidemia   . Rheumatoid arthritis(714.0)     Past Surgical History  Procedure Laterality Date  . Cesarean section    . Foot fracture surgery    . Knee surgery    . Neuroplasty / transposition median nerve at carpal tunnel      Family History  Problem Relation Age of Onset  . Stroke      Social History:  reports that she has never smoked. She does not have any smokeless tobacco history on file. She reports that she does not drink alcohol or use illicit drugs.  Allergies:  Allergies  Allergen Reactions  . Celebrex [Celecoxib] Rash  . Detrol [Tolterodine] Hives    Medications:  Scheduled: . Chlorhexidine Gluconate Cloth  6 each Topical Q0600  . escitalopram  20 mg Oral Daily  . insulin aspart  0-15 Units Subcutaneous TID WC  . insulin aspart  0-5 Units Subcutaneous QHS  . insulin glargine  20 Units Subcutaneous Daily  . mupirocin ointment  1 application Nasal BID  . pantoprazole  40 mg Oral  Daily  . potassium chloride  40 mEq Oral BID  . warfarin  5 mg Oral ONCE-1800  . Warfarin - Pharmacist Dosing Inpatient   Does not apply q1800   Continuous: . sodium chloride 50 mL/hr at 10/02/14 1303    Results for orders placed or performed during the hospital encounter of 09/30/14 (from the past 24 hour(s))  Glucose, capillary     Status: Abnormal   Collection Time: 10/02/14 12:28 PM  Result Value Ref Range   Glucose-Capillary 240 (H) 70 - 99 mg/dL  Glucose, capillary     Status: Abnormal   Collection Time: 10/02/14  4:05 PM  Result Value Ref Range   Glucose-Capillary 144 (H) 70 - 99 mg/dL  Clostridium Difficile by PCR     Status: None   Collection Time: 10/02/14  4:42 PM  Result Value Ref Range   C difficile by pcr NEGATIVE NEGATIVE  Glucose, capillary     Status: Abnormal   Collection Time: 10/02/14  9:59 PM  Result Value Ref Range   Glucose-Capillary 28 (LL) 70 - 99 mg/dL   Comment 1 Notify RN   Glucose, capillary     Status: Abnormal   Collection Time: 10/02/14 10:25 PM  Result Value Ref Range   Glucose-Capillary 127 (H) 70 - 99 mg/dL  Protime-INR     Status: Abnormal   Collection Time:  10/03/14  2:30 AM  Result Value Ref Range   Prothrombin Time 28.4 (H) 11.6 - 15.2 seconds   INR 2.65 (H) 0.00 - 1.49  CBC     Status: Abnormal   Collection Time: 10/03/14  2:30 AM  Result Value Ref Range   WBC 7.2 4.0 - 10.5 K/uL   RBC 3.57 (L) 3.87 - 5.11 MIL/uL   Hemoglobin 10.1 (L) 12.0 - 15.0 g/dL   HCT 72.5 (L) 36.6 - 44.0 %   MCV 84.3 78.0 - 100.0 fL   MCH 28.3 26.0 - 34.0 pg   MCHC 33.6 30.0 - 36.0 g/dL   RDW 34.7 42.5 - 95.6 %   Platelets 157 150 - 400 K/uL  Basic metabolic panel     Status: Abnormal   Collection Time: 10/03/14  2:30 AM  Result Value Ref Range   Sodium 140 137 - 147 mEq/L   Potassium 3.2 (L) 3.7 - 5.3 mEq/L   Chloride 108 96 - 112 mEq/L   CO2 24 19 - 32 mEq/L   Glucose, Bld 97 70 - 99 mg/dL   BUN 7 6 - 23 mg/dL   Creatinine, Ser 3.87 0.50 - 1.10  mg/dL   Calcium 8.3 (L) 8.4 - 10.5 mg/dL   GFR calc non Af Amer >90 >90 mL/min   GFR calc Af Amer >90 >90 mL/min   Anion gap 8 5 - 15  Glucose, capillary     Status: None   Collection Time: 10/03/14  4:14 AM  Result Value Ref Range   Glucose-Capillary 78 70 - 99 mg/dL   Comment 1 Notify RN   Glucose, capillary     Status: Abnormal   Collection Time: 10/03/14  8:04 AM  Result Value Ref Range   Glucose-Capillary 111 (H) 70 - 99 mg/dL   Comment 1 Documented in Chart    Comment 2 Notify RN      Dg Chest Port 1 View  10/02/2014   CLINICAL DATA:  Aspiration.  EXAM: PORTABLE CHEST - 1 VIEW  COMPARISON:  Two-view chest 09/30/2014.  FINDINGS: The heart size is normal. Patient is status post median sternotomy for valve replacement. The lungs are clear. The visualized soft tissues and bony thorax are unremarkable.  IMPRESSION: Negative two view chest.   Electronically Signed   By: Gennette Pac M.D.   On: 10/02/2014 09:12    ROS:  As stated above in the HPI otherwise negative.  Blood pressure 124/49, pulse 70, temperature 97.9 F (36.6 C), temperature source Oral, resp. rate 18, height 5\' 3"  (1.6 m), weight 75.5 kg (166 lb 7.2 oz), SpO2 100 %.    PE: Gen: NAD, Alert and Oriented HEENT:  [place]/AT, EOMI Neck: Supple, no LAD Lungs: CTA Bilaterally CV: RRR without M/G/R ABM: Soft, NTND, +BS Ext: No C/C/E  Assessment/Plan: 1) Odynophagia. 2) Dysphagia. 3) Recent DKA.   Her clinical history is suspicious for a Candidal esophagitis, however, it is unusual to develop this issue now that her blood sugar is under better control.  She also tolerated PO until yesterday.  She can also have a reflux esophagitis.  At this time, I think a trial of fluconazole will be pursued.  If there is no improvement, an EGD can be performed.  Plan: 1) Fluconazole 200 mg IV, or until she can tolerate PO, QD x 10 days.  Madison [name] D 10/03/2014, 9:50 AM

## 2014-10-04 DIAGNOSIS — R1314 Dysphagia, pharyngoesophageal phase: Secondary | ICD-10-CM

## 2014-10-04 LAB — BASIC METABOLIC PANEL
ANION GAP: 13 (ref 5–15)
BUN: 6 mg/dL (ref 6–23)
CALCIUM: 8.3 mg/dL — AB (ref 8.4–10.5)
CO2: 21 mEq/L (ref 19–32)
CREATININE: 0.59 mg/dL (ref 0.50–1.10)
Chloride: 107 mEq/L (ref 96–112)
Glucose, Bld: 152 mg/dL — ABNORMAL HIGH (ref 70–99)
Potassium: 4.1 mEq/L (ref 3.7–5.3)
Sodium: 141 mEq/L (ref 137–147)

## 2014-10-04 LAB — PROTIME-INR
INR: 2.23 — ABNORMAL HIGH (ref 0.00–1.49)
Prothrombin Time: 24.9 seconds — ABNORMAL HIGH (ref 11.6–15.2)

## 2014-10-04 LAB — CBC
HCT: 36 % (ref 36.0–46.0)
Hemoglobin: 11.7 g/dL — ABNORMAL LOW (ref 12.0–15.0)
MCH: 27.9 pg (ref 26.0–34.0)
MCHC: 32.5 g/dL (ref 30.0–36.0)
MCV: 85.9 fL (ref 78.0–100.0)
PLATELETS: 142 10*3/uL — AB (ref 150–400)
RBC: 4.19 MIL/uL (ref 3.87–5.11)
RDW: 14.9 % (ref 11.5–15.5)
WBC: 4.9 10*3/uL (ref 4.0–10.5)

## 2014-10-04 LAB — GLUCOSE, CAPILLARY
GLUCOSE-CAPILLARY: 289 mg/dL — AB (ref 70–99)
Glucose-Capillary: 90 mg/dL (ref 70–99)
Glucose-Capillary: 169 mg/dL — ABNORMAL HIGH (ref 70–99)

## 2014-10-04 MED ORDER — FLUCONAZOLE 100 MG PO TABS: 100.0000 mg | ORAL_TABLET | Freq: Every day | ORAL | Status: DC

## 2014-10-04 MED ORDER — WARFARIN SODIUM 7.5 MG PO TABS: 7.5000 mg | ORAL_TABLET | ORAL | Status: DC

## 2014-10-04 MED ORDER — FLUCONAZOLE 100 MG PO TABS: 100.0000 mg | ORAL_TABLET | Freq: Every day | ORAL | Status: AC

## 2014-10-04 MED ORDER — WARFARIN SODIUM 5 MG PO TABS: ORAL_TABLET | ORAL | Status: DC

## 2014-10-04 MED ORDER — WARFARIN SODIUM 7.5 MG PO TABS
7.5000 mg | ORAL_TABLET | Freq: Once | ORAL | Status: DC
  Filled 2014-10-04: qty 1

## 2014-10-04 MED ORDER — ASPIRIN 81 MG PO TABS: 81.0000 mg | ORAL_TABLET | Freq: Every day | ORAL | Status: DC

## 2014-10-04 MED ORDER — INSULIN DETEMIR 100 UNIT/ML FLEXPEN: 25.0000 [IU] | PEN_INJECTOR | SUBCUTANEOUS | Status: DC

## 2014-10-04 NOTE — Progress Notes (Addendum)
Inpatient Diabetes Program Recommendations  AACE/ADA: New Consensus Statement on Inpatient Glycemic Control (2013)  Target Ranges:  Prepandial:   less than 140 mg/dL      Peak postprandial:   less than 180 mg/dL (1-2 hours)      Critically ill patients:  140 - 180 mg/dL     Results for AVIS, TIRONE (MRN [id number]) as of 10/04/2014 13:57  Ref. Range 10/03/2014 08:04 10/03/2014 11:51 10/03/2014 17:25 10/03/2014 23:17  Glucose-Capillary Latest Range: 70-99 mg/dL 281 (H) 188 (H) 677 (H) 207 (H)    Results for JANNETTE, COTHAM (MRN [id number]) as of 10/04/2014 13:57  Ref. Range 10/04/2014 08:24 10/04/2014 12:17  Glucose-Capillary Latest Range: 70-99 mg/dL 470 (H) 761 (H)     Current DM Meds: Lantus 20 units daily in the AM         Novolog Moderate SSI    Having elevated postprandial glucose levels.    MD- Please consider adding Novolog meal coverage- Novolog 4 units tid with meals      Will follow Ambrose Finland RN, MSN, CDE Diabetes Coordinator Inpatient Diabetes Program Team Pager: [phone] (8a-10p)

## 2014-10-04 NOTE — Progress Notes (Signed)
Patient evaluated for long-term disease management services with [facility] Care Management Program as a benefit of her [employer]. Spoke with her at bedside and she endorses the plan is to return home with home health services. Consents obtained. Confirmed contact information. Patient will receive post discharge calls and will be evaluated for monthly home visits. These services will not replace on interfere with home health. [facility] Care Management packet and contact information provided. Patient also endorses her aide services thru Medicaid have been discontinued and she has issues with transportation at times. [facility] to follow up on patient's concerns post discharge. Will make inpatient RNCM aware of bedside encounter.  Raiford Noble, MSN- Ed, RN,BSN, [facility], [phone]

## 2014-10-04 NOTE — Progress Notes (Signed)
ANTICOAGULATION CONSULT NOTE - Follow Up Consult  Pharmacy Consult for Warfarin Indication: History of Aortic Valve Replacement  Allergies  Allergen Reactions  . Celebrex [Celecoxib] Rash  . Detrol [Tolterodine] Hives    Patient Measurements: Height: 5\' 3"  (160 cm) Weight: 166 lb 7.2 oz (75.5 kg) IBW/kg (Calculated) : 52.4  Vital Signs: Temp: 98.5 F (36.9 C) (11/16 0857) Temp Source: Oral (11/16 0857) BP: 140/53 mmHg (11/16 0857) Pulse Rate: 58 (11/16 0857)  Labs:  Recent Labs  10/02/14 0635 10/03/14 0230 10/04/14 0622  HGB 10.1* 10.1* 11.7*  HCT 30.4* 30.1* 36.0  PLT 174 157 142*  APTT 41*  --   --   LABPROT 30.5* 28.4* 24.9*  INR 2.90* 2.65* 2.23*  CREATININE 0.73 0.70 0.59    Estimated Creatinine Clearance: 79.1 mL/min (by C-G formula based on Cr of 0.59).   Assessment: 42 yoF with history of aortic valve replacement on warfarin. Warfarin was held on 11/14 due to large increase in INR from 1.84 to 2.90 after one dose of 7.5 mg. Today her INR is 2.23. Platelets and Hemoglobin are stable and no signs of bleeding have been noted.   Clarified with MD that her goal is 2.5 to 3.5. Her INR may be labile due to her inconsistent diet while inpatient.    Goal of Therapy:  INR 2.5-3.5 Monitor platelets by anticoagulation protocol: Yes   Plan:  - Warfarin 7.5 mg X 1 - Daily PT/INR  - Monitor plts, H/H, signs of bleeding  Thank you. 12/14, PharmD [phone]  11/16/201511:12 AM

## 2014-10-04 NOTE — Plan of Care (Signed)
Problem: Phase I Progression Outcomes Goal: OOB as tolerated unless otherwise ordered Outcome: Completed/Met Date Met:  10/04/14     

## 2014-10-04 NOTE — Progress Notes (Signed)
Patient ID: Madison [name], female   DOB: 06/12/1961, 53 y.o.   MRN: [id number]  [facility] Progress Note  Subjective: Eating eggs and a bagel for breakfast- says swallowing is better-still a little uncomfortable but not bad- no further nausea ,vomiting or spiiting up blood HGB up to 11.7  Objective:  Vital signs in last 24 hours: Temp:  [98.3 F (36.8 C)-99.1 F (37.3 C)] 98.5 F (36.9 C) (11/16 0857) Pulse Rate:  [58-72] 58 (11/16 0857) Resp:  [16-24] 16 (11/16 0857) BP: (126-140)/(53-64) 140/53 mmHg (11/16 0857) SpO2:  [98 %-100 %] 100 % (11/16 0857) Last BM Date: 10/03/14 General:   Alert,  Well-developed,WF    in NAD Heart:  Regular rate and rhythm; no murmurs  Abdomen:  Soft, nontender and nondistended. Normal bowel sounds, without guarding, and without rebound.    Neurologic:  Alert and  oriented x4;  grossly normal neurologically. Psych:  Alert and cooperative. Normal mood and affect.  Intake/Output from previous day: 11/15 0701 - 11/16 0700 In: 959.2 [P.O.:240; I.V.:719.2] Out: 400 [Urine:400] Intake/Output this shift: Total I/O In: 120 [P.O.:120] Out: -   Lab Results:  Recent Labs  10/02/14 0635 10/03/14 0230 10/04/14 0622  WBC 10.9* 7.2 4.9  HGB 10.1* 10.1* 11.7*  HCT 30.4* 30.1* 36.0  PLT 174 157 142*   BMET  Recent Labs  10/02/14 0635 10/03/14 0230 10/04/14 0622  NA 143 140 141  K 3.4* 3.2* 4.1  CL 108 108 107  CO2 22 24 21   GLUCOSE 192* 97 152*  BUN 10 7 6   CREATININE 0.73 0.70 0.59  CALCIUM 8.3* 8.3* 8.3*   LFT  Recent Labs  10/02/14 0635  PROT 5.5*  ALBUMIN 2.5*  AST 13  ALT 10  ALKPHOS 78  BILITOT 0.3   PT/INR  Recent Labs  10/03/14 0230 10/04/14 0622  LABPROT 28.4* 24.9*  INR 2.65* 2.23*     Assessment / Plan: #1   53 yo female diabetic admitted with DKA, nausea, vomiting and small amt of coffee ground emesis, and odynophagia. Stable since admit- no evidence for bleeding Suspect she has a reflux  esophagitis which is improving She is being covered for possible candidiasis also  No need for EGD at this time Please have her her complete a course of diflucan x 14 days and sent home on a PPI for 4-6 weeks Gi will sign off Active Problems:   DKA (diabetic ketoacidoses)   Altered mental status   Sinus tachycardia   Aspiration into airway     LOS: 4 days   Amy Esterwood  10/04/2014, 10:22 AM  Attending MD note:   I have taken a history, and reviewed the chart. I agree with the Advanced Practitioner's impression and recommendations.   [facility] Pager # [phone]

## 2014-10-04 NOTE — Discharge Summary (Signed)
Madison [name], 53 y.o., DOB 05-10-1961, MRN [id number]. Admission date: 09/30/2014 Discharge Date 10/04/2014 Primary MD CAMPBELL, Sherran Needs, FNP Admitting Physician Hollice Espy, MD  Admission Diagnosis  Transient alteration of awareness [R40.4] Transient hypotension [R03.1] Volume depletion [E86.9] Fall, initial encounter [W19.XXXA] Diabetic ketoacidosis without coma associated with type 1 diabetes mellitus [E10.10]  Discharge Diagnosis   Active Problems:   DKA (diabetic ketoacidoses)   Altered mental status   Sinus tachycardia   Aspiration into airway   Past Medical History  Diagnosis Date  . CAD (coronary artery disease)   . Obesity   . Hypercholesteremia   . HTN (hypertension)   . Dyslipidemia   . Stroke syndrome   . Aortic stenosis   . Thrombophlebitis   . Diabetes   . Hyperlipidemia   . Rheumatoid arthritis(714.0)     Past Surgical History  Procedure Laterality Date  . Cesarean section    . Foot fracture surgery    . Knee surgery    . Neuroplasty / transposition median nerve at carpal tunnel      Admission history of present illness/brief narrative: Patient was admitted on 09/30/14 to critical care, as she was found at home on the floor, with vomiting ( possible coffee-ground emesis) , patient was found to be hypotensive, tachypneic, in DKA, was admitted initially to critical care, patient was started on insulin drip, for her DKA, anion gap closed, patient has transitioned to Lantus with insulin sliding scale, she is tolerating oral intake, patient had no further episodes of coffee-ground emesis during hospitalization, tolerated oral intake very well, hemoglobin remaines stable, she is resume back on her warfarin for AVR.patient had an episode of hypoglycemia on 10/02/14, she relates that to poor oral intake secondary to odynophagia. Gastroenterology were consulted for odynophagia, thought this is most likely fungal, she was started on Diflucan with immediate  improvement of her symptoms.  Hospital Course See H&P, Labs, Consult and Test reports for all details in brief, patient was admitted for **  Active Problems:   DKA (diabetic ketoacidoses)   Altered mental status   Sinus tachycardia   Aspiration into airway  DKA -aniongap closed, agent is tolerating oral intake -had an episode of hypoglycemia 10/02/14 evening on February units subcutaneous Lantus daily, so patient will be discharged on 25 units of subcutaneous Lantus, insulin sliding scale instructions to go off by 5 units daily if her fingersticks remains above 200 daily until she reached her home dose of 50 units subcutaneous daily.  Acute encephalopathy -Resolved, back to baseline, as well as most likely metabolic encephalopathy regarding ear DKA  Vomiting/questionable coffee-ground emesis -No further episodes during hospitalization, hemoglobin has been stable since admission(baseline as an outpatient 11.9 , pain is 11.7 at day of discharge. -continue with PPI.  odynophagia -Most likely related to fungal esophagitis, significant improvement after being started on Diflucan, will need to continue total of 14 days after discharge.  Leukocytosis -Most likely reactive secondary to DKA, a febrile, negative urinalysis, and chest x-ray,Propulsid and continues to trend down. -Resolved, off antibiotics,  AVR -continue with warfarin for target INR 2.5-3.5, INR is 2.23 at day of discharge   Consults  PCCM Gastroenterology  Significant Tests:  See full reports for all details    Dg Chest 2 View  09/30/2014   CLINICAL DATA:  Larey Seat today, left-sided weakness and confusion, initial evaluation  EXAM: CHEST  2 VIEW  COMPARISON:  03/08/2014  FINDINGS: The heart size and vascular pattern are normal. No consolidation effusion or  pneumothorax. Bony thorax is intact. Replacement aortic valve noted.  IMPRESSION: No active cardiopulmonary disease.   Electronically Signed   By: Esperanza Heir M.D.    On: 09/30/2014 11:37   Dg Cervical Spine Complete  09/30/2014   CLINICAL DATA:  Larey Seat today, initial evaluation, left-sided weakness  EXAM: CERVICAL SPINE  4+ VIEWS  COMPARISON:  None.  FINDINGS: There is no evidence of cervical spine fracture or prevertebral soft tissue swelling. Alignment is normal. No other significant bone abnormalities are identified.  IMPRESSION: Negative cervical spine radiographs.   Electronically Signed   By: Esperanza Heir M.D.   On: 09/30/2014 11:34   Ct Head Wo Contrast  09/30/2014   CLINICAL DATA:  Patient fell at home, altered mental status, personal history of stroke, new onset of blindness  EXAM: CT HEAD WITHOUT CONTRAST  TECHNIQUE: Contiguous axial images were obtained from the base of the skull through the vertex without intravenous contrast.  COMPARISON:  03/08/2014  FINDINGS: There is diffuse atrophy. There is widespread encephalomalacia bilaterally including right frontal lobe, right parietal lobe, and right occipital lobe. On the left, there is focal encephalomalacia in the anterior parietal lobe and in the occipital lobe. There is left cerebellar hemisphere encephalomalacia. There is diffuse low attenuation in the white matter. There are no new areas of abnormality. There is no hemorrhage or extra-axial fluid. There is no hydrocephalus. The calvarium is intact. There is intracranial internal carotid artery and bilateral vertebral artery calcification.  IMPRESSION: Extensive chronic encephalomalacia with no acute findings.   Electronically Signed   By: Esperanza Heir M.D.   On: 09/30/2014 11:16   Dg Chest Port 1 View  10/02/2014   CLINICAL DATA:  Aspiration.  EXAM: PORTABLE CHEST - 1 VIEW  COMPARISON:  Two-view chest 09/30/2014.  FINDINGS: The heart size is normal. Patient is status post median sternotomy for valve replacement. The lungs are clear. The visualized soft tissues and bony thorax are unremarkable.  IMPRESSION: Negative two view chest.   Electronically  Signed   By: Gennette Pac M.D.   On: 10/02/2014 09:12     Today   Subjective:   Madison [name] today has no headache,no chest abdominal pain,no new weakness tingling or numbness, feels much better wants to go home today.   Objective:   Blood pressure 140/53, pulse 58, temperature 98.5 F (36.9 C), temperature source Oral, resp. rate 16, height 5\' 3"  (1.6 m), weight 75.5 kg (166 lb 7.2 oz), SpO2 100 %.  Intake/Output Summary (Last 24 hours) at 10/04/14 1139 Last data filed at 10/04/14 0900  Gross per 24 hour  Intake 809.17 ml  Output    400 ml  Net 409.17 ml    Exam Awake Alert, Oriented *3, No new F.N deficits, Normal affect .AT,PERRAL Supple Neck,No JVD, No cervical lymphadenopathy appriciated.  Symmetrical Chest wall movement, Good air movement bilaterally, CTAB RRR,No Gallops,Rubs or new Murmurs, No Parasternal Heave +ve B.Sounds, Abd Soft, Non tender, No organomegaly appriciated, No rebound -guarding or rigidity. No Cyanosis, Clubbing or edema, No new Rash or bruise  Data Review   Cultures -   CBC w Diff: Lab Results  Component Value Date   WBC 4.9 10/04/2014   HGB 11.7* 10/04/2014   HCT 36.0 10/04/2014   PLT 142* 10/04/2014   LYMPHOPCT 20 10/02/2014   BANDSPCT 20* 09/30/2014   MONOPCT 2* 10/02/2014   EOSPCT 0 10/02/2014   BASOPCT 0 10/02/2014   CMP: Lab Results  Component Value Date   NA  141 10/04/2014   K 4.1 10/04/2014   CL 107 10/04/2014   CO2 21 10/04/2014   BUN 6 10/04/2014   CREATININE 0.59 10/04/2014   PROT 5.5* 10/02/2014   ALBUMIN 2.5* 10/02/2014   BILITOT 0.3 10/02/2014   ALKPHOS 78 10/02/2014   AST 13 10/02/2014   ALT 10 10/02/2014  .  Micro Results Recent Results (from the past 240 hour(s))  MRSA PCR Screening     Status: Abnormal   Collection Time: 09/30/14  4:36 PM  Result Value Ref Range Status   MRSA by PCR POSITIVE (A) NEGATIVE Final    Comment:        The GeneXpert MRSA Assay (FDA approved for NASAL specimens only), is  one component of a comprehensive MRSA colonization surveillance program. It is not intended to diagnose MRSA infection nor to guide or monitor treatment for MRSA infections. RESULT CALLED TO, READ BACK BY AND VERIFIED WITH: S.MYERS,RN AT 2100 BY L.PITT 09/30/14   Culture, Urine     Status: None   Collection Time: 09/30/14  5:49 PM  Result Value Ref Range Status   Specimen Description URINE, CATHETERIZED  Final   Special Requests NONE  Final   Culture  Setup Time   Final    09/30/2014 21:21 Performed at [facility]    Colony Count NO GROWTH Performed at [facility]   Final   Culture NO GROWTH Performed at [facility]   Final   Report Status 10/01/2014 FINAL  Final  Clostridium Difficile by PCR     Status: None   Collection Time: 10/02/14  4:42 PM  Result Value Ref Range Status   C difficile by pcr NEGATIVE NEGATIVE Final     Discharge Instructions      Follow-up Information    Follow up with CAMPBELL, PADONDA BOYD, FNP. Schedule an appointment as soon as possible for a visit in 5 days.   Specialty:  Family Medicine   Contact information:   [address] [place] [address] [phone]       Discharge Medications     Medication List    TAKE these medications        aspirin 81 MG tablet  Take 1 tablet (81 mg total) by mouth daily.  Start taking on:  10/11/2014     baclofen 10 MG tablet  Commonly known as:  LIORESAL  Take 1 tablet (10 mg total) by mouth 2 (two) times daily.     escitalopram 20 MG tablet  Commonly known as:  LEXAPRO  Take 20 mg by mouth daily.     fluconazole 100 MG tablet  Commonly known as:  DIFLUCAN  Take 1 tablet (100 mg total) by mouth daily.     glucose blood test strip  Commonly known as:  EMBRACE BLOOD GLUCOSE TEST  Use to check blood sugar twice daily     glucose blood test strip  Test 5 times a day, and lancets 2/day 250.03     Insulin Detemir 100 UNIT/ML Pen  Commonly known as:   LEVEMIR FLEXTOUCH  Inject 25 Units into the skin every morning. and pen needles 1/day,patient to increase Lantus 5 units per day for every reading FINGERSTICK more than 200 , until she reaches her home regimen dose of 50 units subcutaneous daily.     omeprazole 40 MG capsule  Commonly known as:  PRILOSEC  Take 1 capsule (40 mg total) by mouth daily.     pravastatin 20 MG  tablet  Commonly known as:  PRAVACHOL  TAKE 1 TABLET EVERY DAY     solifenacin 5 MG tablet  Commonly known as:  VESICARE  Take 1 tablet (5 mg total) by mouth daily.     traMADol 50 MG tablet  Commonly known as:  ULTRAM  TAKE 1 OR 2 TABLETS BY MOUTH EVERY 8 HOURS AS NEEDED     warfarin 5 MG tablet  Commonly known as:  COUMADIN  Take as directed by anticoagulation clinic, patient is on 5 mg oral daily except Wednesdays where she is taking 7.5 mg oral on Wednesdays, was instructed to continue with the same regimen, and follow with her anticoagulation clinic.     warfarin 7.5 MG tablet  Commonly known as:  COUMADIN  Take 1 tablet (7.5 mg total) by mouth every Wednesday. As instructed by  coagulation clinic, warfarin 5 mg oral daily except Wednesday, on Wednesday 7.5 mg oral , continue to follow with the clinic on discharge         Total Time in preparing paper work, data evaluation and todays exam - 35 minutes  Rayona Sardinha M.D on 10/04/2014 at 11:39 AM  [facility] Office  [phone]

## 2014-10-04 NOTE — Discharge Instructions (Signed)
Information on my medicine - Coumadin   (Warfarin)  This medication education was reviewed with me or my healthcare representative as part of my discharge preparation.  The pharmacist that spoke with me during my hospital stay was:  McCallister, Suzan Slick, [facility]  Why was Coumadin prescribed for you? Coumadin was prescribed for you because you have a blood clot or a medical condition that can cause an increased risk of forming blood clots. Blood clots can cause serious health problems by blocking the flow of blood to the heart, lung, or brain. Coumadin can prevent harmful blood clots from forming. As a reminder your indication for Coumadin is:   Blood Clot Prevention After Heart Valve Surgery  What test will check on my response to Coumadin? While on Coumadin (warfarin) you will need to have an INR test regularly to ensure that your dose is keeping you in the desired range. The INR (international normalized ratio) number is calculated from the result of the laboratory test called prothrombin time (PT).  If an INR APPOINTMENT HAS NOT ALREADY BEEN MADE FOR YOU please schedule an appointment to have this lab work done by your health care provider within 7 days. Your INR goal is usually a number between:  2 to 3 or your provider may give you a more narrow range like 2-2.5.  Ask your health care provider during an office visit what your goal INR is.  What  do you need to  know  About  COUMADIN? Take Coumadin (warfarin) exactly as prescribed by your healthcare provider about the same time each day.  DO NOT stop taking without talking to the doctor who prescribed the medication.  Stopping without other blood clot prevention medication to take the place of Coumadin may increase your risk of developing a new clot or stroke.  Get refills before you run out.  What do you do if you miss a dose? If you miss a dose, take it as soon as you remember on the same day then continue your regularly scheduled regimen the  next day.  Do not take two doses of Coumadin at the same time.  Important Safety Information A possible side effect of Coumadin (Warfarin) is an increased risk of bleeding. You should call your healthcare provider right away if you experience any of the following: ? Bleeding from an injury or your nose that does not stop. ? Unusual colored urine (red or dark brown) or unusual colored stools (red or black). ? Unusual bruising for unknown reasons. ? A serious fall or if you hit your head (even if there is no bleeding).  Some foods or medicines interact with Coumadin (warfarin) and might alter your response to warfarin. To help avoid this: ? Eat a balanced diet, maintaining a consistent amount of Vitamin K. ? Notify your provider about major diet changes you plan to make. ? Avoid alcohol or limit your intake to 1 drink for women and 2 drinks for men per day. (1 drink is 5 oz. wine, 12 oz. beer, or 1.5 oz. liquor.)  Make sure that ANY health care provider who prescribes medication for you knows that you are taking Coumadin (warfarin).  Also make sure the healthcare provider who is monitoring your Coumadin knows when you have started a new medication including herbals and non-prescription products.  Coumadin (Warfarin)  Major Drug Interactions  Increased Warfarin Effect Decreased Warfarin Effect  Alcohol (large quantities) Antibiotics (esp. Septra/Bactrim, Flagyl, Cipro) Amiodarone (Cordarone) Aspirin (ASA) Cimetidine (Tagamet) Megestrol (Megace) NSAIDs (  ibuprofen, naproxen, etc.) Piroxicam (Feldene) Propafenone (Rythmol SR) Propranolol (Inderal) Isoniazid (INH) Posaconazole (Noxafil) Barbiturates (Phenobarbital) Carbamazepine (Tegretol) Chlordiazepoxide (Librium) Cholestyramine (Questran) Griseofulvin Oral Contraceptives Rifampin Sucralfate (Carafate) Vitamin K   Coumadin (Warfarin) Major Herbal Interactions  Increased Warfarin Effect Decreased Warfarin Effect   Garlic Ginseng Ginkgo biloba Coenzyme Q10 Green tea St. Johns wort    Coumadin (Warfarin) FOOD Interactions  Eat a consistent number of servings per week of foods HIGH in Vitamin K (1 serving =  cup)  Collards (cooked, or boiled & drained) Kale (cooked, or boiled & drained) Mustard greens (cooked, or boiled & drained) Parsley *serving size only =  cup Spinach (cooked, or boiled & drained) Swiss chard (cooked, or boiled & drained) Turnip greens (cooked, or boiled & drained)  Eat a consistent number of servings per week of foods MEDIUM-HIGH in Vitamin K (1 serving = 1 cup)  Asparagus (cooked, or boiled & drained) Broccoli (cooked, boiled & drained, or raw & chopped) Brussel sprouts (cooked, or boiled & drained) *serving size only =  cup Lettuce, raw (green leaf, endive, romaine) Spinach, raw Turnip greens, raw & chopped   These websites have more information on Coumadin (warfarin):  http://www.king-russell.com/; https://www.hines.net/;  Follow with Primary MD CAMPBELL, PADONDA BOYD, FNP in 5 days  Please continue to follow your INR level and adjust  Warfarin with your PCP as you were doing prior hospital admission. Get CBC, CMP,INR, 2 view Chest X ray checked  by Primary MD next visit.    Activity: As tolerated with Full fall precautions use walker/cane & assistance as needed   Disposition Home **   Diet: Heart Healthy ** , with feeding assistance and aspiration precautions as needed.  For Heart failure patients - Check your Weight same time everyday, if you gain over 2 pounds, or you develop in leg swelling, experience more shortness of breath or chest pain, call your Primary MD immediately. Follow Cardiac Low Salt Diet and 1.8 lit/day fluid restriction.   On your next visit with your primary care physician please Get Medicines reviewed and adjusted.   Please request your Prim.MD to go over all Hospital Tests and Procedure/Radiological results at the follow up,  please get all Hospital records sent to your Prim MD by signing hospital release before you go home.   If you experience worsening of your admission symptoms, develop shortness of breath, life threatening emergency, suicidal or homicidal thoughts you must seek medical attention immediately by calling 911 or calling your MD immediately  if symptoms less severe.  You Must read complete instructions/literature along with all the possible adverse reactions/side effects for all the Medicines you take and that have been prescribed to you. Take any new Medicines after you have completely understood and accpet all the possible adverse reactions/side effects.   Do not drive, operating heavy machinery, perform activities at heights, swimming or participation in water activities or provide baby sitting services if your were admitted for syncope or siezures until you have seen by Primary MD or a Neurologist and advised to do so again.  Do not drive when taking Pain medications.    Do not take more than prescribed Pain, Sleep and Anxiety Medications  Special Instructions: If you have smoked or chewed Tobacco  in the last 2 yrs please stop smoking, stop any regular Alcohol  and or any Recreational drug use.  Wear Seat belts while driving.   Please note  You were cared for by a hospitalist during your hospital stay. If you  have any questions about your discharge medications or the care you received while you were in the hospital after you are discharged, you can call the unit and asked to speak with the hospitalist on call if the hospitalist that took care of you is not available. Once you are discharged, your primary care physician will handle any further medical issues. Please note that NO REFILLS for any discharge medications will be authorized once you are discharged, as it is imperative that you return to your primary care physician (or establish a relationship with a primary care physician if you do not  have one) for your aftercare needs so that they can reassess your need for medications and monitor your lab values.

## 2014-10-06 ENCOUNTER — Telehealth: Payer: Self-pay

## 2014-10-06 ENCOUNTER — Telehealth: Payer: Self-pay | Admitting: Family

## 2014-10-06 NOTE — Telephone Encounter (Signed)
Left message for Madison [name] to call back

## 2014-10-06 NOTE — Telephone Encounter (Signed)
Received report from [facility] stating they have been trying to reach pt to follow up on referral that was done 10/04/2014 and they have been unable to reach her.   Chart indicates that pt was transported to ER on 09/30/14 by EMS after young son found her unconscious on the floor.  I attempted to reach pt by phone to no avail and no voicemail. Spoke with pt's emergency contact sister [name], who states that she spoke with pt yesterday. I will attempt to reach pt again later

## 2014-10-06 NOTE — Telephone Encounter (Signed)
Pt returned my call. She states that [facility] came out to see her yesterday

## 2014-10-06 NOTE — Telephone Encounter (Signed)
Diane will send papers for order

## 2014-10-06 NOTE — Telephone Encounter (Signed)
Diane rn at [facility]  is calling requesting a Administrator, [employer] Service for community resources and occupational and physical therapy for pt post hosp  . Pt is having trouble with dm and weakness

## 2014-10-07 ENCOUNTER — Telehealth: Payer: Self-pay | Admitting: Family

## 2014-10-07 NOTE — Telephone Encounter (Signed)
Noted  

## 2014-10-07 NOTE — Telephone Encounter (Signed)
Patient Information:  Caller Name: Mirabella  Phone: [phone]  Patient: Madison [name], Madison [name]  Gender: Female  DOB: 14-Jun-1961  Age: 53 Years  PCP: Adline Mango [facility])  Pregnant: No  Office Follow Up:  Does the office need to follow up with this patient?: No  Instructions For The Office: N/A  RN Note:  Pt. is scheduled for an appt. on 10/08/14 with Dr. Clent Ridges for a recheck on her symptoms. Does not have transpotation until then.Will call back if worsens.  Symptoms  Reason For Call & Symptoms: Pt. is Diabetic and hx of stroke. Last week, pt. had vomiting and had tear in the GI tract.Went to the ED(09/30/14) and was admitted and stayed until 10/04/14.States her Blood sugar was 16. Pt. with an 19 year old son who called 911 because she was bleeding from her ear.Continues to complain of nausea and afraid she is going to start vomiting again. Pt. on Fluconazole but does not feel like she is getting any better and feels like she did before needing to go to the ED.  Reviewed Health History In EMR: Yes  Reviewed Medications In EMR: Yes  Reviewed Allergies In EMR: Yes  Reviewed Surgeries / Procedures: Yes  Date of Onset of Symptoms: 09/29/2014  Treatments Tried: Fluconazole 100mg .  Treatments Tried Worked: No OB / GYN:  LMP: Unknown  Guideline(s) Used:  Abdominal Pain - Female  Disposition Per Guideline:   See Today in Office  Reason For Disposition Reached:   Moderate or mild pain that comes and goes (cramps) lasts > 24 hours  Advice Given:  Rest:  Lie down and rest until you feel better.  Fluids:  Sip clear fluids only (e.g., water, flat soft drinks or 1/2 strength fruit juice) until the pain has been gone for over 2 hours. Then slowly return to a regular diet.  Call Back If:  Abdominal pain is constant and present for more than 2 hours  You become worse.  Patient Will Follow Care Advice:  YES  Appointment Scheduled:  10/08/2014 09:30:00 Appointment Scheduled  Provider:  10/10/2014 [facility])

## 2014-10-08 ENCOUNTER — Ambulatory Visit (INDEPENDENT_AMBULATORY_CARE_PROVIDER_SITE_OTHER): Payer: Commercial Managed Care - HMO | Admitting: Family Medicine

## 2014-10-08 ENCOUNTER — Encounter: Payer: Self-pay | Admitting: Family Medicine

## 2014-10-08 VITALS — BP 119/67 | HR 77 | Temp 98.3°F | Ht 63.0 in | Wt 158.0 lb

## 2014-10-08 DIAGNOSIS — E088 Diabetes mellitus due to underlying condition with unspecified complications: Secondary | ICD-10-CM

## 2014-10-08 DIAGNOSIS — E0811 Diabetes mellitus due to underlying condition with ketoacidosis with coma: Secondary | ICD-10-CM

## 2014-10-08 DIAGNOSIS — R1314 Dysphagia, pharyngoesophageal phase: Secondary | ICD-10-CM

## 2014-10-08 MED ORDER — PROMETHAZINE HCL 25 MG PO TABS: 25.0000 mg | ORAL_TABLET | ORAL | Status: DC | PRN

## 2014-10-08 MED ORDER — OMEPRAZOLE 40 MG PO CPDR: 40.0000 mg | DELAYED_RELEASE_CAPSULE | Freq: Every day | ORAL | Status: DC

## 2014-10-08 NOTE — Progress Notes (Signed)
Pre visit review using our clinic review tool, if applicable. No additional management support is needed unless otherwise documented below in the visit note. 

## 2014-10-08 NOTE — Progress Notes (Signed)
   Subjective:    Patient ID: Madison [name], female    DOB: 30-Mar-1961, 53 y.o.   MRN: [id number]  HPI Here to follow up after a hospital stay from 09-30-14- to 10-04-14 for DKA and encephalopathy. An A1c was not drawn at that time but this was 10.4 on 06-11-14. Her glucoses at home since her DC run from 90 to 200. Her glucoses were normalized in the hospital with insulin and IV fluids. She was found to have vomited up coffee ground emesis at home prior to her admission but no further signs of bleeding were seen. She has taken Prilosec for years for GERD, but she ran out so she has not been on any acid blockers since her DC. She developed pain on swallowing in the hospital and GI was consulted. She was seen by Dr. Elnoria Howard and Dr. Juanda Chance, and it was felt she had esophageal Candidiasis. She was put on Diflucan and in fact this improved. She is still on this at home. Her BMs are back to normal with no black stools or any signs of bleeding. No fevers. Since going home she has been very nauseated continuously though she has not vomited. She tries to eat a tiny bit of food but is mostly sipping fluids. She also describes burning pains in the epigastrium.    Review of Systems  Constitutional: Negative.   Respiratory: Negative.   Cardiovascular: Negative.   Gastrointestinal: Positive for nausea and abdominal pain. Negative for vomiting, diarrhea, constipation, blood in stool, abdominal distention, anal bleeding and rectal pain.  Genitourinary: Negative.   Neurological: Negative.        Objective:   Physical Exam  Constitutional: She is oriented to person, place, and time. She appears well-developed and well-nourished. No distress.  Cardiovascular: Normal rate, regular rhythm, normal heart sounds and intact distal pulses.   Pulmonary/Chest: Effort normal and breath sounds normal.  Abdominal: Soft. Bowel sounds are normal. She exhibits no distension and no mass. There is no rebound and no guarding.  Mildly  tender in the epigastrium   Neurological: She is alert and oriented to person, place, and time.          Assessment & Plan:  She is probably dealing with GERD and gastritis, if not an actual gastric ulcer. We will get her back on Prilosec 40 mg daily. Use Phenergan for nausea. She will follow up with Adline Mango next week (her PCP) and we will refer her back to GI. She may require an EGD to evaluate further.

## 2014-10-12 ENCOUNTER — Telehealth: Payer: Self-pay | Admitting: Family

## 2014-10-12 ENCOUNTER — Other Ambulatory Visit: Payer: Self-pay

## 2014-10-12 MED ORDER — METHOTREXATE 2.5 MG PO TABS: 2.5000 mg | ORAL_TABLET | ORAL | Status: DC

## 2014-10-12 NOTE — Telephone Encounter (Signed)
Kaitlyn from [facility] states they are having a difficult time making contact with the patient.  The phone just rings with no voice mail and her emergency contact does the same thing.  She will keep trying.

## 2014-10-12 NOTE — Telephone Encounter (Signed)
Gabby from [facility] given verbal Rx for methotrexate 2.5mg  to be taken 1 tab twice weekly 90 day supply no refills

## 2014-10-17 ENCOUNTER — Other Ambulatory Visit: Payer: Self-pay | Admitting: Endocrinology

## 2014-10-19 ENCOUNTER — Ambulatory Visit: Payer: Medicare HMO | Admitting: Family

## 2014-10-20 ENCOUNTER — Telehealth: Payer: Self-pay

## 2014-10-20 NOTE — Telephone Encounter (Signed)
Please advise pt to ret as scheduled.  Please decline request for visits for med mgmt.

## 2014-10-20 NOTE — Telephone Encounter (Signed)
Katlin nurse with [facility] called to discuss blood sugar readings. Pt blood sugars have been all over the place. At home visit on 10/19/2014 pt blood sugar was 36. After drinking OJ pt's blood sugar went up to 107. Nurse stated she looked at pt's glucometer and her readings have been as low as 27 and as high as 400. Pt is currently taking 50 units of levemir daily. Nurse wanted MD to be advised. Also nurse wanted to see if she could get verbal orders to have 3 more nurse visits with pt to go over medication management and to check on blood sugar. Pt scheduled for ov on 12/3.  Please advise, Thanks!

## 2014-10-20 NOTE — Telephone Encounter (Signed)
Pt and home health nurse advised of note below.

## 2014-10-21 ENCOUNTER — Encounter: Payer: Self-pay | Admitting: Endocrinology

## 2014-10-21 ENCOUNTER — Other Ambulatory Visit: Payer: Self-pay | Admitting: *Deleted

## 2014-10-21 ENCOUNTER — Ambulatory Visit (INDEPENDENT_AMBULATORY_CARE_PROVIDER_SITE_OTHER): Payer: Commercial Managed Care - HMO | Admitting: Endocrinology

## 2014-10-21 VITALS — BP 128/69 | HR 73 | Temp 98.7°F | Ht 63.0 in | Wt 166.4 lb

## 2014-10-21 DIAGNOSIS — E1042 Type 1 diabetes mellitus with diabetic polyneuropathy: Secondary | ICD-10-CM

## 2014-10-21 LAB — TSH: TSH: 0.83 u[IU]/mL (ref 0.35–4.50)

## 2014-10-21 LAB — MICROALBUMIN / CREATININE URINE RATIO
CREATININE, U: 30.6 mg/dL
Microalb Creat Ratio: 1 mg/g (ref 0.0–30.0)
Microalb, Ur: 0.3 mg/dL (ref 0.0–1.9)

## 2014-10-21 LAB — HEMOGLOBIN A1C: Hgb A1c MFr Bld: 10.8 % — ABNORMAL HIGH (ref 4.6–6.5)

## 2014-10-21 MED ORDER — INSULIN ASPART 100 UNIT/ML FLEXPEN: 5.0000 [IU] | PEN_INJECTOR | Freq: Every day | SUBCUTANEOUS | Status: DC

## 2014-10-21 NOTE — Progress Notes (Signed)
Subjective:    Patient ID: Madison [name], female    DOB: 11-04-1961, 53 y.o.   MRN: [id number]  HPI  Pt returns for f/u of diabetes mellitus: DM type: Insulin-requiring type 2 Dx'ed: 1981 Complications: polyneuropathy, retinopathy, CVA's, and CAD Therapy: insulin since dx GDM: never DKA: last episode was in April of 2015 Severe hypoglycemia: last episode was in 2014. Pancreatitis: never Other: she was on insulin pump rx until CVA's prevented her from using the device in early 2014; in early 2015, she was changed to a simple insulin schedule, due to poor results with multiple daily injections. Interval history: no cbg record, but states cbg's vary from 36 (afternoon) up to 200's (HS).  She feels much better since her recent hospitalization for DKA.     Past Medical History  Diagnosis Date  . CAD (coronary artery disease)   . Obesity   . Hypercholesteremia   . HTN (hypertension)   . Dyslipidemia   . Stroke syndrome   . Aortic stenosis   . Thrombophlebitis   . Diabetes   . Hyperlipidemia   . Rheumatoid arthritis(714.0)     Past Surgical History  Procedure Laterality Date  . Cesarean section    . Foot fracture surgery    . Knee surgery    . Neuroplasty / transposition median nerve at carpal tunnel      History   Social History  . Marital Status: Single    Spouse Name: N/A    Number of Children: N/A  . Years of Education: N/A   Occupational History  . Not on file.   Social History Main Topics  . Smoking status: Never Smoker   . Smokeless tobacco: Never Used  . Alcohol Use: No  . Drug Use: No  . Sexual Activity: Not on file   Other Topics Concern  . Not on file   Social History Narrative    Current Outpatient Prescriptions on File Prior to Visit  Medication Sig Dispense Refill  . aspirin 81 MG tablet Take 1 tablet (81 mg total) by mouth daily. 30 tablet   . baclofen (LIORESAL) 10 MG tablet Take 1 tablet (10 mg total) by mouth 2 (two) times daily. 60 each 0    . escitalopram (LEXAPRO) 20 MG tablet Take 20 mg by mouth daily.    Marland Kitchen glucose blood (EMBRACE BLOOD GLUCOSE TEST) test strip Use to check blood sugar twice daily 200 each 3  . Insulin Detemir (LEVEMIR FLEXTOUCH) 100 UNIT/ML Pen Inject 25 Units into the skin every morning. and pen needles 1/day,patient to increase Lantus 5 units per day for every reading FINGERSTICK more than 200 , until she reaches her home regimen dose of 50 units subcutaneous daily. (Patient taking differently: Inject 45 Units into the skin every morning. ) 45 mL 2  . methotrexate (RHEUMATREX) 2.5 MG tablet Take 1 tablet (2.5 mg total) by mouth 2 (two) times a week. Caution:Chemotherapy. Protect from light. 4 tablet 0  . omeprazole (PRILOSEC) 40 MG capsule Take 1 capsule (40 mg total) by mouth daily. 30 capsule 0  . ONE TOUCH ULTRA TEST test strip TEST 5 TIMES A DAY, AND LANCETS 2/DAY 250.03 (Patient taking differently: TEST 5 TIMES A DAY  250.03) 150 each 1  . pravastatin (PRAVACHOL) 20 MG tablet TAKE 1 TABLET EVERY DAY 90 tablet 0  . promethazine (PHENERGAN) 25 MG tablet Take 1 tablet (25 mg total) by mouth every 4 (four) hours as needed for nausea or vomiting. 60 tablet 0  .  solifenacin (VESICARE) 5 MG tablet Take 1 tablet (5 mg total) by mouth daily. 90 tablet 0  . traMADol (ULTRAM) 50 MG tablet TAKE 1 OR 2 TABLETS BY MOUTH EVERY 8 HOURS AS NEEDED 90 tablet 1  . warfarin (COUMADIN) 5 MG tablet Take as directed by anticoagulation clinic, patient is on 5 mg oral daily except Wednesdays where she is taking 7.5 mg oral on Wednesdays, was instructed to continue with the same regimen, and follow with her anticoagulation clinic. 90 tablet 0  . warfarin (COUMADIN) 7.5 MG tablet Take 1 tablet (7.5 mg total) by mouth every Wednesday. As instructed by  coagulation clinic, warfarin 5 mg oral daily except Wednesday, on Wednesday 7.5 mg oral , continue to follow with the clinic on discharge 1 tablet 0   No current facility-administered  medications on file prior to visit.    Allergies  Allergen Reactions  . Celebrex [Celecoxib] Rash  . Detrol [Tolterodine] Hives    Family History  Problem Relation Age of Onset  . Stroke      BP 128/69 mmHg  Pulse 73  Temp(Src) 98.7 F (37.1 C) (Oral)  Ht 5\' 3"  (1.6 m)  Wt 166 lb 6.4 oz (75.479 kg)  BMI 29.48 kg/m2   Review of Systems Denies LOC and weight change.      Objective:   Physical Exam VITAL SIGNS:  See vs page.   GENERAL: no distress.  Pulses: dorsalis pedis intact bilat.  Feet: no deformity. . no edema. There is bilateral onychomycosis.   Skin: no ulcer on the feet. feet are of normal color and temp. Neuro: sensation is intact to touch on the feet, but decreased from normal.    Lab Results  Component Value Date   HGBA1C 10.8* 10/21/2014      Assessment & Plan:  DM: severe exacerbation DKA: as this is due to lack of insulin, it indicates noncompliance.  Noncompliance with cbg recording: I'll work around this as best I can   Patient is advised the following: Patient Instructions  check your blood sugar twice a day.  vary the time of day when you check, between before the 3 meals, and at bedtime.  also check if you have symptoms of your blood sugar being too high or too low.  please keep a record of the readings and bring it to your next appointment here.  You can write it on any piece of paper.  please call 14/01/2014 sooner if your blood sugar goes below 70, or if you have a lot of readings over 200.   A diabetes blood test is requested for you today.  We'll let you know about the results.   Please come back for a follow-up appointment in 6 weeks.     reduce levemir to 45 units qam, and add humalog 5 units with the evening meal.

## 2014-10-21 NOTE — Telephone Encounter (Signed)
Request for Baclofen refill, pt has not seen Dr. Wynn Banker sine July with no appts scheduled in the future, notified pharmacy that patient should make an appt in order to get an updated RX

## 2014-10-21 NOTE — Patient Instructions (Addendum)
check your blood sugar twice a day.  vary the time of day when you check, between before the 3 meals, and at bedtime.  also check if you have symptoms of your blood sugar being too high or too low.  please keep a record of the readings and bring it to your next appointment here.  You can write it on any piece of paper.  please call us sooner if your blood sugar goes below 70, or if you have a lot of readings over 200.   A diabetes blood test is requested for you today.  We'll let you know about the results.   Please come back for a follow-up appointment in 6 weeks.

## 2014-10-22 ENCOUNTER — Other Ambulatory Visit: Payer: Self-pay

## 2014-10-22 ENCOUNTER — Other Ambulatory Visit: Payer: Self-pay | Admitting: Family

## 2014-10-22 MED ORDER — INSULIN LISPRO 100 UNIT/ML (KWIKPEN): PEN_INJECTOR | SUBCUTANEOUS | Status: DC

## 2014-10-25 ENCOUNTER — Telehealth: Payer: Self-pay | Admitting: Family

## 2014-10-25 NOTE — Telephone Encounter (Signed)
Caitlin rn from [facility] is calling to requesting additional nurse visit for pt due to blood sugar issues.Luther Parody also needs order to check pt protime unless our office would like to do protime.

## 2014-10-25 NOTE — Telephone Encounter (Signed)
Left message to advise Madison [name], per Oran Rein, pt may continue home nurse care and protime checks. Advised Madison [name] to fax order if necessary or call back if she needs to

## 2014-10-28 ENCOUNTER — Telehealth: Payer: Self-pay | Admitting: Family

## 2014-10-28 NOTE — Telephone Encounter (Signed)
Pt request refill baclofen (LIORESAL) tablet 10 mg [facility]

## 2014-10-29 ENCOUNTER — Telehealth: Payer: Self-pay

## 2014-10-29 MED ORDER — BACLOFEN 10 MG PO TABS: 10.0000 mg | ORAL_TABLET | Freq: Two times a day (BID) | ORAL | Status: DC

## 2014-10-29 NOTE — Telephone Encounter (Signed)
Done

## 2014-10-29 NOTE — Telephone Encounter (Signed)
Tiffany called to state to PT: 39 and INR 3.3. Pt takes coumadin 5mg  every day except on Wednesday 7.5mg . Thursday number is [phone] and she wants to know how often do you want her to check PT/INR.

## 2014-11-01 ENCOUNTER — Ambulatory Visit (INDEPENDENT_AMBULATORY_CARE_PROVIDER_SITE_OTHER): Payer: Commercial Managed Care - HMO | Admitting: Family

## 2014-11-01 DIAGNOSIS — Z5181 Encounter for therapeutic drug level monitoring: Secondary | ICD-10-CM

## 2014-11-01 DIAGNOSIS — Z954 Presence of other heart-valve replacement: Secondary | ICD-10-CM

## 2014-11-01 DIAGNOSIS — Z952 Presence of prosthetic heart valve: Secondary | ICD-10-CM

## 2014-11-01 LAB — POCT INR: INR: 3.3

## 2014-11-01 NOTE — Telephone Encounter (Signed)
Left message on Tiffany's voicemail to advise of Padonda's note

## 2014-11-01 NOTE — Telephone Encounter (Signed)
Hold coumadin today. Then continue current dose.

## 2014-11-01 NOTE — Telephone Encounter (Signed)
Per Padonda, correction on her note. Pt should continue current dose and recheck in 4 weeks

## 2014-11-01 NOTE — Telephone Encounter (Signed)
Please advise 

## 2014-11-01 NOTE — Patient Instructions (Signed)
Take 1 tablet daily except 1 1/2 on Wednesdays (7.5mg ). Recheck in 4 weeks.   Anticoagulation Dose Instructions as of 11/01/2014      Glynis Smiles Tue Wed Thu Fri Sat   New Dose 5 mg 5 mg 5 mg 7.5 mg 5 mg 5 mg 5 mg    Description        Take 1 tablet daily except 1 1/2 on Wednesdays (7.5mg ). Recheck in 4 weeks.

## 2014-11-02 ENCOUNTER — Telehealth: Payer: Self-pay | Admitting: Family

## 2014-11-02 NOTE — Telephone Encounter (Signed)
Spoke with pt to advise that recommendation was made by [facility] for pt to try methotrexate as a form of tx for RA.   Pt aware and verbalized understanding

## 2014-11-02 NOTE — Telephone Encounter (Signed)
Katlyn from [facility] saw pt today. Pt was asking her why is she taking the following medication  methotrexate (RHEUMATREX) 2.5 MG tablet Pt said she received the med in the mail last week  Pt would like a call back concerning this

## 2014-11-04 ENCOUNTER — Other Ambulatory Visit: Payer: Self-pay | Admitting: Family Medicine

## 2014-11-08 ENCOUNTER — Telehealth: Payer: Self-pay | Admitting: Family

## 2014-11-08 NOTE — Telephone Encounter (Signed)
Pt has [employer] medicare now. Medicaid has cancelled pt.  Pt needs a letter to the insurance co stating she needs approval for home health aid to assist in the home.  Fax  [phone]

## 2014-11-09 NOTE — Telephone Encounter (Signed)
Please provide letter.

## 2014-11-10 NOTE — Telephone Encounter (Signed)
The correct fax number is: Clinical In Take [phone]

## 2014-11-10 NOTE — Telephone Encounter (Signed)
Letter faxed.

## 2014-11-15 ENCOUNTER — Other Ambulatory Visit: Payer: Self-pay

## 2014-11-16 ENCOUNTER — Ambulatory Visit: Payer: Medicare HMO | Admitting: Family

## 2014-11-17 ENCOUNTER — Encounter: Payer: Self-pay | Admitting: Cardiovascular Disease

## 2014-11-17 ENCOUNTER — Ambulatory Visit (INDEPENDENT_AMBULATORY_CARE_PROVIDER_SITE_OTHER): Payer: Commercial Managed Care - HMO | Admitting: Cardiovascular Disease

## 2014-11-17 VITALS — BP 110/58 | HR 88 | Ht 63.0 in | Wt 166.8 lb

## 2014-11-17 DIAGNOSIS — I639 Cerebral infarction, unspecified: Secondary | ICD-10-CM

## 2014-11-17 DIAGNOSIS — I10 Essential (primary) hypertension: Secondary | ICD-10-CM

## 2014-11-17 DIAGNOSIS — Z952 Presence of prosthetic heart valve: Secondary | ICD-10-CM

## 2014-11-17 DIAGNOSIS — Z954 Presence of other heart-valve replacement: Secondary | ICD-10-CM

## 2014-11-17 DIAGNOSIS — IMO0002 Reserved for concepts with insufficient information to code with codable children: Secondary | ICD-10-CM

## 2014-11-17 DIAGNOSIS — E1040 Type 1 diabetes mellitus with diabetic neuropathy, unspecified: Secondary | ICD-10-CM

## 2014-11-17 NOTE — Assessment & Plan Note (Signed)
?   Due to subRx INR consider lovenox bridging if coumadin stopped

## 2014-11-17 NOTE — Patient Instructions (Signed)
Your physician wants you to follow-up in: YEAR WITH DR NISHAN  You will receive a reminder letter in the mail two months in advance. If you don't receive a letter, please call our office to schedule the follow-up appointment.  Your physician recommends that you continue on your current medications as directed. Please refer to the Current Medication list given to you today. 

## 2014-11-17 NOTE — Assessment & Plan Note (Signed)
Discussed low carb diet.  Target hemoglobin A1c is 6.5 or less.  Continue current medications.  

## 2014-11-17 NOTE — Assessment & Plan Note (Signed)
Well controlled.  Continue current medications and low sodium Dash type diet.    

## 2014-11-17 NOTE — Assessment & Plan Note (Signed)
Echo ok 2014 no AR murmur  Rx INR normal sytolic gradients  SBE prophylaxis

## 2014-11-17 NOTE — Progress Notes (Signed)
Patient ID: Madison [name], female   DOB: 03-Apr-1961, 53 y.o.   MRN: [id number]   53  yo with previous care in [place]. History of bicuspid AV with AVR 2012. Chronic coumadin. 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 Currently has some aphasiia and left hand weakness but overall does well. Needs a primary care doctor Needs refills on coumadin and to be established in coumadin clinic. No dyspnea, chest pain or syncope. Sister drove her to her appt today. Review of [place] records show no CAD at cath prior to surgery   IDDM sees Loleta Chance 4/15 Study Conclusions  - Left ventricle: The cavity size was normal. There was moderate concentric hypertrophy. Systolic function was normal. The estimated ejection fraction was in the range of 60% to 65%. Wall motion was normal; there were no regional wall motion abnormalities. Doppler parameters are consistent with abnormal left ventricular relaxation (grade 1 diastolic dysfunction). - Aortic valve: A mechanical aortic valve sits well inthe aortic position. There is no aortic insufficiency and no paravalvular leak. The peak and mean transaortic gradients are 15 and 26 mmHg, respectively and are normal for this type of prosthetic valve. - Aortic root: The aortic root was normal in size. - Mitral valve: Mild regurgitation. - Right ventricle: Systolic function was normal. - Tricuspid valve: No regurgitation. - Pulmonary arteries: Systolic pressure was within the normal range. - Inferior vena cava: The vessel was normal in size. - Pericardium, extracardiac: There was no pericardial effusion.      ROS: Denies fever, malais, weight loss, blurry vision, decreased visual acuity, cough, sputum, SOB, hemoptysis, pleuritic pain, palpitaitons, heartburn, abdominal pain,  melena, lower extremity edema, claudication, or rash.  All other systems reviewed and negative   General: Affect appropriate Healthy:  appears stated age HEENT: normal Neck supple with no adenopathy JVP normal no bruits no thyromegaly Lungs clear with no wheezing and good diaphragmatic motion Heart:  S1/S2 click no AR  murmur,rub, gallop or click PMI normal Abdomen: benighn, BS positve, no tenderness, no AAA no bruit.  No HSM or HJR Distal pulses intact with no bruits No edema Neuro right facial droop old  Skin warm and dry No muscular weakness  Medications Current Outpatient Prescriptions  Medication Sig Dispense Refill  . aspirin 81 MG tablet Take 1 tablet (81 mg total) by mouth daily. 30 tablet   . baclofen (LIORESAL) 10 MG tablet Take 1 tablet (10 mg total) by mouth 2 (two) times daily. 60 each 1  . escitalopram (LEXAPRO) 20 MG tablet Take 20 mg by mouth daily.    Marland Kitchen glucose blood (EMBRACE BLOOD GLUCOSE TEST) test strip Use to check blood sugar twice daily 200 each 3  . insulin aspart (NOVOLOG FLEXPEN) 100 UNIT/ML FlexPen Inject 5 Units into the skin daily with supper. 15 mL 11  . Insulin Detemir (LEVEMIR FLEXTOUCH) 100 UNIT/ML Pen Inject 25 Units into the skin every morning. and pen needles 1/day,patient to increase Lantus 5 units per day for every reading FINGERSTICK more than 200 , until she reaches her home regimen dose of 50 units subcutaneous daily. (Patient taking differently: Inject 45 Units into the skin every morning. ) 45 mL 2  . insulin lispro (HUMALOG KWIKPEN) 100 UNIT/ML KiwkPen Inject 5 units with evening meal 15 mL 2  . methotrexate (RHEUMATREX) 2.5 MG tablet Take 1 tablet (  2.5 mg total) by mouth 2 (two) times a week. Caution:Chemotherapy. Protect from light. 4 tablet 0  . omeprazole (PRILOSEC) 40 MG capsule TAKE 1 CAPSULE (40 MG TOTAL) BY MOUTH DAILY. 30 capsule 6  . ONE TOUCH ULTRA TEST test strip TEST 5 TIMES A DAY, AND LANCETS 2/DAY 250.03 (Patient taking  differently: TEST 5 TIMES A DAY  250.03) 150 each 1  . pravastatin (PRAVACHOL) 20 MG tablet TAKE 1 TABLET EVERY DAY 90 tablet 0  . promethazine (PHENERGAN) 25 MG tablet Take 1 tablet (25 mg total) by mouth every 4 (four) hours as needed for nausea or vomiting. 60 tablet 0  . solifenacin (VESICARE) 5 MG tablet Take 1 tablet (5 mg total) by mouth daily. 90 tablet 0  . traMADol (ULTRAM) 50 MG tablet TAKE 1 OR 2 TABLETS BY MOUTH EVERY 8 HOURS AS NEEDED 90 tablet 1  . warfarin (COUMADIN) 5 MG tablet Take as directed by anticoagulation clinic, patient is on 5 mg oral daily except Wednesdays where she is taking 7.5 mg oral on Wednesdays, was instructed to continue with the same regimen, and follow with her anticoagulation clinic. 90 tablet 0  . warfarin (COUMADIN) 7.5 MG tablet Take 1 tablet (7.5 mg total) by mouth every Wednesday. As instructed by  coagulation clinic, warfarin 5 mg oral daily except Wednesday, on Wednesday 7.5 mg oral , continue to follow with the clinic on discharge 1 tablet 0  . zolpidem (AMBIEN) 10 MG tablet Take 1 tablet by mouth at bedtime.  0  . VESICARE 5 MG tablet TAKE 1/2 TABLET ONCE DAILY (Patient not taking: Reported on 11/17/2014) 45 tablet 1   No current facility-administered medications for this visit.    Allergies Celebrex and Detrol  Family History: Family History  Problem Relation Age of Onset  . Stroke    . Heart disease Mother   . Heart disease Sister     Social History: History   Social History  . Marital Status: Single    Spouse Name: N/A    Number of Children: N/A  . Years of Education: N/A   Occupational History  . Not on file.   Social History Main Topics  . Smoking status: Never Smoker   . Smokeless tobacco: Never Used  . Alcohol Use: No  . Drug Use: No  . Sexual Activity: Not on file   Other Topics Concern  . Not on file   Social History Narrative    Past Surgical History  Procedure Laterality Date  . Cesarean section    . Foot  fracture surgery    . Knee surgery    . Neuroplasty / transposition median nerve at carpal tunnel      Past Medical History  Diagnosis Date  . CAD (coronary artery disease)   . Obesity   . Hypercholesteremia   . HTN (hypertension)   . Dyslipidemia   . Stroke syndrome   . Aortic stenosis   . Thrombophlebitis   . Diabetes   . Hyperlipidemia   . Rheumatoid arthritis(714.0)     Electrocardiogram:  Assessment and Plan

## 2014-11-21 DIAGNOSIS — E161 Other hypoglycemia: Secondary | ICD-10-CM | POA: Diagnosis not present

## 2014-11-25 ENCOUNTER — Ambulatory Visit: Payer: Medicare HMO | Admitting: Family

## 2014-11-25 ENCOUNTER — Telehealth: Payer: Self-pay | Admitting: Family

## 2014-11-25 DIAGNOSIS — M069 Rheumatoid arthritis, unspecified: Secondary | ICD-10-CM | POA: Diagnosis not present

## 2014-11-25 DIAGNOSIS — B3781 Candidal esophagitis: Secondary | ICD-10-CM | POA: Diagnosis not present

## 2014-11-25 DIAGNOSIS — Z952 Presence of prosthetic heart valve: Secondary | ICD-10-CM

## 2014-11-25 DIAGNOSIS — E1065 Type 1 diabetes mellitus with hyperglycemia: Secondary | ICD-10-CM | POA: Diagnosis not present

## 2014-11-25 DIAGNOSIS — W19XXXD Unspecified fall, subsequent encounter: Secondary | ICD-10-CM | POA: Diagnosis not present

## 2014-11-25 DIAGNOSIS — Z7901 Long term (current) use of anticoagulants: Secondary | ICD-10-CM | POA: Diagnosis not present

## 2014-11-25 DIAGNOSIS — M6281 Muscle weakness (generalized): Secondary | ICD-10-CM | POA: Diagnosis not present

## 2014-11-25 DIAGNOSIS — F0390 Unspecified dementia without behavioral disturbance: Secondary | ICD-10-CM | POA: Diagnosis not present

## 2014-11-25 DIAGNOSIS — Z5181 Encounter for therapeutic drug level monitoring: Secondary | ICD-10-CM

## 2014-11-25 LAB — POCT INR: INR: 1.6

## 2014-11-25 MED ORDER — WARFARIN SODIUM 5 MG PO TABS: ORAL_TABLET | ORAL | Status: DC

## 2014-11-25 NOTE — Telephone Encounter (Signed)
Spoke with pt and she reports she is doing fine and is having no problems. Pt denies and missed doses of coumadin or any changes. Advised pt to take an extra 5mg  tab today and an extra 1/2 tomorrow and have Caitlyn recheck next week when she comes to see her. Pt verbalized understanding  Refill for 5mg  tabs sent to pharmacy.

## 2014-11-25 NOTE — Telephone Encounter (Signed)
C[facility]lin [facility] called to report pt had a fall yesterday looking for her 54 yr old son. Pt has some bruising on arms, legs, right side of face, chin scraped up. Luther Parody will be out for the last visit next week.   Caitlin also did INR   1.6 pls advise.  Either [place] or pt can be cb if need

## 2014-11-29 ENCOUNTER — Ambulatory Visit (HOSPITAL_BASED_OUTPATIENT_CLINIC_OR_DEPARTMENT_OTHER): Payer: Medicare HMO | Admitting: Physical Medicine & Rehabilitation

## 2014-11-29 ENCOUNTER — Encounter: Payer: Commercial Managed Care - HMO | Attending: Physical Medicine & Rehabilitation

## 2014-11-29 ENCOUNTER — Encounter: Payer: Self-pay | Admitting: Physical Medicine & Rehabilitation

## 2014-11-29 VITALS — BP 134/76 | HR 80 | Resp 14

## 2014-11-29 DIAGNOSIS — G8110 Spastic hemiplegia affecting unspecified side: Secondary | ICD-10-CM | POA: Diagnosis not present

## 2014-11-29 NOTE — Progress Notes (Signed)
Subjective:    Patient ID: Madison [name], female    DOB: 1961-03-28, 54 y.o.   MRN: [id number]  HPI Good effect from botox injection 06/10/2014 Index finger has tightened up again. This started in November or December. Discussed with the patient that this means a 4 month minimum response. This is very good. Interval history had a fall with some bruising and scrapes approximately 1 week ago.   Pain Inventory Average Pain 3 Pain Right Now 3 My pain is intermittent, dull and aching  In the last 24 hours, has pain interfered with the following? General activity 4 Relation with others 3 Enjoyment of life 1 What TIME of day is your pain at its worst? daytime Sleep (in general) Good  Pain is worse with: some activites Pain improves with: therapy/exercise Relief from Meds: 3  Mobility walk without assistance how many minutes can you walk? 30 ability to climb steps?  yes do you drive?  no  Function disabled: date disabled .  Neuro/Psych weakness numbness  Prior Studies Any changes since last visit?  no  Physicians involved in your care Any changes since last visit?  no   Family History  Problem Relation Age of Onset  . Stroke    . Heart disease Mother   . Heart disease Sister    History   Social History  . Marital Status: Single    Spouse Name: N/A    Number of Children: N/A  . Years of Education: N/A   Social History Main Topics  . Smoking status: Never Smoker   . Smokeless tobacco: Never Used  . Alcohol Use: No  . Drug Use: No  . Sexual Activity: None   Other Topics Concern  . None   Social History Narrative   Past Surgical History  Procedure Laterality Date  . Cesarean section    . Foot fracture surgery    . Knee surgery    . Neuroplasty / transposition median nerve at carpal tunnel     Past Medical History  Diagnosis Date  . CAD (coronary artery disease)   . Obesity   . Hypercholesteremia   . HTN (hypertension)   . Dyslipidemia   .  Stroke syndrome   . Aortic stenosis   . Thrombophlebitis   . Diabetes   . Hyperlipidemia   . Rheumatoid arthritis(714.0)    BP 134/76 mmHg  Pulse 80  Resp 14  SpO2 100%  Opioid Risk Score:   Fall Risk Score: Moderate Fall Risk (6-13 points)  Review of Systems  HENT: Negative.   Eyes: Negative.   Respiratory: Negative.   Cardiovascular: Negative.   Gastrointestinal: Negative.   Endocrine: Negative.   Genitourinary: Negative.   Musculoskeletal:       Left arm/hand pain  Skin: Negative.   Allergic/Immunologic: Negative.   Neurological: Positive for weakness and numbness.  Hematological: Negative.   Psychiatric/Behavioral: Positive for decreased concentration.       Objective:   Physical Exam  Ashworth grade 3 in the left index finger flexors. Ashworth grade 2 in the remaining digits at the flexors. Ashworth grade 3 at the pronators. Skin shows minor abrasions over the extensor surface of the index and middle finger on the left side. Also facial bruising right maxillary area      Assessment & Plan:  1. 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   2. Poor balance related to CVA, usually uses a walker however on this  occasion when she tripped on the steps she did not have her walker with her. We reviewed that this would be important anytime she is outside the house.

## 2014-11-29 NOTE — Patient Instructions (Signed)
Next visit will be for Botox injection.

## 2014-12-01 ENCOUNTER — Ambulatory Visit: Payer: Medicare HMO | Admitting: Family

## 2014-12-01 ENCOUNTER — Telehealth: Payer: Self-pay | Admitting: Family

## 2014-12-01 ENCOUNTER — Telehealth: Payer: Self-pay | Admitting: Endocrinology

## 2014-12-01 DIAGNOSIS — E1065 Type 1 diabetes mellitus with hyperglycemia: Secondary | ICD-10-CM | POA: Diagnosis not present

## 2014-12-01 DIAGNOSIS — M069 Rheumatoid arthritis, unspecified: Secondary | ICD-10-CM | POA: Diagnosis not present

## 2014-12-01 DIAGNOSIS — W19XXXD Unspecified fall, subsequent encounter: Secondary | ICD-10-CM | POA: Diagnosis not present

## 2014-12-01 DIAGNOSIS — M6281 Muscle weakness (generalized): Secondary | ICD-10-CM | POA: Diagnosis not present

## 2014-12-01 DIAGNOSIS — F0390 Unspecified dementia without behavioral disturbance: Secondary | ICD-10-CM | POA: Diagnosis not present

## 2014-12-01 DIAGNOSIS — B3781 Candidal esophagitis: Secondary | ICD-10-CM | POA: Diagnosis not present

## 2014-12-01 DIAGNOSIS — Z7901 Long term (current) use of anticoagulants: Secondary | ICD-10-CM | POA: Diagnosis not present

## 2014-12-01 NOTE — Telephone Encounter (Signed)
Pt accidentally missed her appt this morning and she has rescheduled it to Monday at 8:15. RN would like orders sent to [facility] or called for verbal to continue her weekly skill assesment, vitals and PT/INR and blood sugars. PT/INR has not been done this week since she missed her appt today. OK to lmom, confidential vm.

## 2014-12-01 NOTE — Telephone Encounter (Signed)
See below and please advise, Thanks!  

## 2014-12-01 NOTE — Telephone Encounter (Signed)
Pt advised of note below and voiced understanding.  

## 2014-12-01 NOTE — Telephone Encounter (Signed)
Please continue the same insulin Ov tomorrow as scheduled

## 2014-12-01 NOTE — Telephone Encounter (Signed)
Madison [name] from [facility] stated that Patient  Is being discharged from there services, but patient is still having random Lows in her blood sugar, she ask if you would like for her to continue out to her house. Please advise, Phone # [phone]

## 2014-12-01 NOTE — Telephone Encounter (Signed)
Verbal orders left on personally identified voicemail for Madison [name]. Advised to check pt's INR if she will see her tomorrow or Friday.

## 2014-12-02 ENCOUNTER — Ambulatory Visit (INDEPENDENT_AMBULATORY_CARE_PROVIDER_SITE_OTHER): Payer: Commercial Managed Care - HMO | Admitting: Endocrinology

## 2014-12-02 ENCOUNTER — Encounter: Payer: Self-pay | Admitting: Endocrinology

## 2014-12-02 VITALS — BP 132/64 | HR 91 | Temp 98.6°F | Ht 63.0 in | Wt 166.0 lb

## 2014-12-02 DIAGNOSIS — E1040 Type 1 diabetes mellitus with diabetic neuropathy, unspecified: Secondary | ICD-10-CM

## 2014-12-02 NOTE — Patient Instructions (Addendum)
check your blood sugar twice a day.  vary the time of day when you check, between before the 3 meals, and at bedtime.  also check if you have symptoms of your blood sugar being too high or too low.  please keep a record of the readings and bring it to your next appointment here.  You can write it on any piece of paper.  please call us sooner if your blood sugar goes below 70, or if you have a lot of readings over 200.    On this type of insulin schedule, you should eat meals on a regular schedule.  If a meal is missed or significantly delayed, your blood sugar could go low. Please come back for a follow-up appointment in 4 weeks.    Please see Madison [name] the same day, to go over sick-day management.

## 2014-12-02 NOTE — Progress Notes (Signed)
Subjective:    Patient ID: Madison [name], female    DOB: [birth year], 54 y.o.   MRN: [id number]  HPI Pt returns for f/u of diabetes mellitus: DM type: Insulin-requiring type 2 Dx'ed: 1981 Complications: polyneuropathy, retinopathy, CVA's, and CAD.   Therapy: insulin since dx GDM: never DKA: last 2 episodes were in 2015 Severe hypoglycemia: last episode was in 2014. Pancreatitis: never Other: she was on insulin pump rx until CVA's prevented her from using the device in early 2014; in early 2015, she was changed to a simple insulin schedule, due to poor results with multiple daily injections. Interval history: she brings a record of her cbg's which i have reviewed today.  we will need to take this complex situation in stages varies from 52-400.  There is no trend throughout the day. Past Medical History  Diagnosis Date  . CAD (coronary artery disease)   . Obesity   . Hypercholesteremia   . HTN (hypertension)   . Dyslipidemia   . Stroke syndrome   . Aortic stenosis   . Thrombophlebitis   . Diabetes   . Hyperlipidemia   . Rheumatoid arthritis(714.0)     Past Surgical History  Procedure Laterality Date  . Cesarean section    . Foot fracture surgery    . Knee surgery    . Neuroplasty / transposition median nerve at carpal tunnel      History   Social History  . Marital Status: Single    Spouse Name: N/A    Number of Children: N/A  . Years of Education: N/A   Occupational History  . Not on file.   Social History Main Topics  . Smoking status: Never Smoker   . Smokeless tobacco: Never Used  . Alcohol Use: No  . Drug Use: No  . Sexual Activity: Not on file   Other Topics Concern  . Not on file   Social History Narrative    Current Outpatient Prescriptions on File Prior to Visit  Medication Sig Dispense Refill  . aspirin 81 MG tablet Take 1 tablet (81 mg total) by mouth daily. 30 tablet   . baclofen (LIORESAL) 10 MG tablet Take 1 tablet (10 mg total) by mouth 2  (two) times daily. 60 each 1  . escitalopram (LEXAPRO) 20 MG tablet Take 20 mg by mouth daily.    Marland Kitchen glucose blood (EMBRACE BLOOD GLUCOSE TEST) test strip Use to check blood sugar twice daily 200 each 3  . Insulin Detemir (LEVEMIR FLEXTOUCH) 100 UNIT/ML Pen Inject 25 Units into the skin every morning. and pen needles 1/day,patient to increase Lantus 5 units per day for every reading FINGERSTICK more than 200 , until she reaches her home regimen dose of 50 units subcutaneous daily. (Patient taking differently: Inject 50 Units into the skin every morning. ) 45 mL 2  . insulin lispro (HUMALOG KWIKPEN) 100 UNIT/ML KiwkPen Inject 5 units with evening meal 15 mL 2  . methotrexate (RHEUMATREX) 2.5 MG tablet Take 1 tablet (2.5 mg total) by mouth 2 (two) times a week. Caution:Chemotherapy. Protect from light. 4 tablet 0  . omeprazole (PRILOSEC) 40 MG capsule TAKE 1 CAPSULE (40 MG TOTAL) BY MOUTH DAILY. 30 capsule 6  . ONE TOUCH ULTRA TEST test strip TEST 5 TIMES A DAY, AND LANCETS 2/DAY 250.03 (Patient taking differently: TEST 5 TIMES A DAY  250.03) 150 each 1  . pravastatin (PRAVACHOL) 20 MG tablet TAKE 1 TABLET EVERY DAY 90 tablet 0  . promethazine (PHENERGAN) 25 MG  tablet Take 1 tablet (25 mg total) by mouth every 4 (four) hours as needed for nausea or vomiting. 60 tablet 0  . solifenacin (VESICARE) 5 MG tablet Take 1 tablet (5 mg total) by mouth daily. 90 tablet 0  . traMADol (ULTRAM) 50 MG tablet TAKE 1 OR 2 TABLETS BY MOUTH EVERY 8 HOURS AS NEEDED 90 tablet 1  . VESICARE 5 MG tablet TAKE 1/2 TABLET ONCE DAILY 45 tablet 1  . warfarin (COUMADIN) 5 MG tablet Take as directed by anticoagulation clinic 90 tablet 0  . warfarin (COUMADIN) 7.5 MG tablet Take 1 tablet (7.5 mg total) by mouth every Wednesday. As instructed by  coagulation clinic, warfarin 5 mg oral daily except Wednesday, on Wednesday 7.5 mg oral , continue to follow with the clinic on discharge 1 tablet 0  . zolpidem (AMBIEN) 10 MG tablet Take 1  tablet by mouth at bedtime.  0   No current facility-administered medications on file prior to visit.    Allergies  Allergen Reactions  . Celebrex [Celecoxib] Rash  . Detrol [Tolterodine] Hives    Family History  Problem Relation Age of Onset  . Stroke    . Heart disease Mother   . Heart disease Sister     BP 132/64 mmHg  Pulse 91  Temp(Src) 98.6 F (37 C) (Oral)  Ht 5\' 3"  (1.6 m)  Wt 166 lb (75.297 kg)  BMI 29.41 kg/m2  SpO2 98%    Review of Systems Denies weight change and n/v.      Objective:   Physical Exam VITAL SIGNS:  See vs page GENERAL: no distress Pulses: dorsalis pedis intact bilat.  Feet: no deformity. . no edema. There is bilateral onychomycosis.   Skin: no ulcer on the feet. feet are of normal color and temp.  Neuro: sensation is intact to touch on the feet, but decreased from normal.   Lab Results  Component Value Date   HGBA1C 10.8* 10/21/2014   i personally reviewed electrocardiogram tracing (09/30/14): sinus tachycardia    Assessment & Plan:  DM: severe exacerbation.  In this situation, the goal needs to be prevention of DKA and severe hypoglycemia.    Patient is advised the following: Patient Instructions  check your blood sugar twice a day.  vary the time of day when you check, between before the 3 meals, and at bedtime.  also check if you have symptoms of your blood sugar being too high or too low.  please keep a record of the readings and bring it to your next appointment here.  You can write it on any piece of paper.  please call 13/12/15 sooner if your blood sugar goes below 70, or if you have a lot of readings over 200.    On this type of insulin schedule, you should eat meals on a regular schedule.  If a meal is missed or significantly delayed, your blood sugar could go low. Please come back for a follow-up appointment in 4 weeks.    Please see [place] the same day, to go over sick-day management.   addendum: pt is advised to continue the  same insulin for now.

## 2014-12-03 ENCOUNTER — Emergency Department (HOSPITAL_COMMUNITY): Payer: Commercial Managed Care - HMO

## 2014-12-03 ENCOUNTER — Encounter (HOSPITAL_COMMUNITY): Payer: Self-pay | Admitting: Emergency Medicine

## 2014-12-03 ENCOUNTER — Inpatient Hospital Stay (HOSPITAL_COMMUNITY)
Admission: EM | Admit: 2014-12-03 | Discharge: 2014-12-15 | DRG: 853 | Disposition: A | Discharge status: Home Health | Payer: Commercial Managed Care - HMO | Attending: Internal Medicine | Admitting: Internal Medicine

## 2014-12-03 ENCOUNTER — Inpatient Hospital Stay (HOSPITAL_COMMUNITY): Payer: Commercial Managed Care - HMO

## 2014-12-03 DIAGNOSIS — D638 Anemia in other chronic diseases classified elsewhere: Secondary | ICD-10-CM | POA: Diagnosis present

## 2014-12-03 DIAGNOSIS — E875 Hyperkalemia: Secondary | ICD-10-CM | POA: Diagnosis present

## 2014-12-03 DIAGNOSIS — I69354 Hemiplegia and hemiparesis following cerebral infarction affecting left non-dominant side: Secondary | ICD-10-CM

## 2014-12-03 DIAGNOSIS — Z7982 Long term (current) use of aspirin: Secondary | ICD-10-CM

## 2014-12-03 DIAGNOSIS — I517 Cardiomegaly: Secondary | ICD-10-CM | POA: Diagnosis not present

## 2014-12-03 DIAGNOSIS — Z952 Presence of prosthetic heart valve: Secondary | ICD-10-CM | POA: Diagnosis not present

## 2014-12-03 DIAGNOSIS — I10 Essential (primary) hypertension: Secondary | ICD-10-CM | POA: Diagnosis present

## 2014-12-03 DIAGNOSIS — E1049 Type 1 diabetes mellitus with other diabetic neurological complication: Secondary | ICD-10-CM | POA: Diagnosis present

## 2014-12-03 DIAGNOSIS — A4189 Other specified sepsis: Secondary | ICD-10-CM | POA: Diagnosis not present

## 2014-12-03 DIAGNOSIS — R6520 Severe sepsis without septic shock: Secondary | ICD-10-CM

## 2014-12-03 DIAGNOSIS — J80 Acute respiratory distress syndrome: Secondary | ICD-10-CM | POA: Diagnosis not present

## 2014-12-03 DIAGNOSIS — I639 Cerebral infarction, unspecified: Secondary | ICD-10-CM | POA: Diagnosis not present

## 2014-12-03 DIAGNOSIS — E1040 Type 1 diabetes mellitus with diabetic neuropathy, unspecified: Secondary | ICD-10-CM | POA: Diagnosis present

## 2014-12-03 DIAGNOSIS — I5043 Acute on chronic combined systolic (congestive) and diastolic (congestive) heart failure: Secondary | ICD-10-CM | POA: Diagnosis present

## 2014-12-03 DIAGNOSIS — M069 Rheumatoid arthritis, unspecified: Secondary | ICD-10-CM | POA: Diagnosis not present

## 2014-12-03 DIAGNOSIS — R7309 Other abnormal glucose: Secondary | ICD-10-CM | POA: Diagnosis not present

## 2014-12-03 DIAGNOSIS — R791 Abnormal coagulation profile: Secondary | ICD-10-CM

## 2014-12-03 DIAGNOSIS — E10319 Type 1 diabetes mellitus with unspecified diabetic retinopathy without macular edema: Secondary | ICD-10-CM | POA: Diagnosis present

## 2014-12-03 DIAGNOSIS — R778 Other specified abnormalities of plasma proteins: Secondary | ICD-10-CM

## 2014-12-03 DIAGNOSIS — I2510 Atherosclerotic heart disease of native coronary artery without angina pectoris: Secondary | ICD-10-CM | POA: Diagnosis present

## 2014-12-03 DIAGNOSIS — R7989 Other specified abnormal findings of blood chemistry: Secondary | ICD-10-CM

## 2014-12-03 DIAGNOSIS — N179 Acute kidney failure, unspecified: Secondary | ICD-10-CM | POA: Diagnosis not present

## 2014-12-03 DIAGNOSIS — Z9119 Patient's noncompliance with other medical treatment and regimen: Secondary | ICD-10-CM | POA: Diagnosis present

## 2014-12-03 DIAGNOSIS — R6521 Severe sepsis with septic shock: Secondary | ICD-10-CM | POA: Diagnosis not present

## 2014-12-03 DIAGNOSIS — R4182 Altered mental status, unspecified: Secondary | ICD-10-CM | POA: Diagnosis not present

## 2014-12-03 DIAGNOSIS — G8110 Spastic hemiplegia affecting unspecified side: Secondary | ICD-10-CM | POA: Diagnosis not present

## 2014-12-03 DIAGNOSIS — E1065 Type 1 diabetes mellitus with hyperglycemia: Secondary | ICD-10-CM

## 2014-12-03 DIAGNOSIS — Z452 Encounter for adjustment and management of vascular access device: Secondary | ICD-10-CM | POA: Diagnosis not present

## 2014-12-03 DIAGNOSIS — E876 Hypokalemia: Secondary | ICD-10-CM | POA: Diagnosis present

## 2014-12-03 DIAGNOSIS — E1010 Type 1 diabetes mellitus with ketoacidosis without coma: Secondary | ICD-10-CM | POA: Diagnosis not present

## 2014-12-03 DIAGNOSIS — I248 Other forms of acute ischemic heart disease: Secondary | ICD-10-CM | POA: Diagnosis not present

## 2014-12-03 DIAGNOSIS — E872 Acidosis, unspecified: Secondary | ICD-10-CM

## 2014-12-03 DIAGNOSIS — E46 Unspecified protein-calorie malnutrition: Secondary | ICD-10-CM | POA: Diagnosis present

## 2014-12-03 DIAGNOSIS — IMO0002 Reserved for concepts with insufficient information to code with codable children: Secondary | ICD-10-CM | POA: Diagnosis present

## 2014-12-03 DIAGNOSIS — A419 Sepsis, unspecified organism: Secondary | ICD-10-CM | POA: Diagnosis not present

## 2014-12-03 DIAGNOSIS — E861 Hypovolemia: Secondary | ICD-10-CM | POA: Diagnosis not present

## 2014-12-03 DIAGNOSIS — N19 Unspecified kidney failure: Secondary | ICD-10-CM | POA: Diagnosis not present

## 2014-12-03 DIAGNOSIS — I69320 Aphasia following cerebral infarction: Secondary | ICD-10-CM | POA: Diagnosis not present

## 2014-12-03 DIAGNOSIS — E785 Hyperlipidemia, unspecified: Secondary | ICD-10-CM | POA: Diagnosis present

## 2014-12-03 DIAGNOSIS — Z7901 Long term (current) use of anticoagulants: Secondary | ICD-10-CM | POA: Diagnosis not present

## 2014-12-03 DIAGNOSIS — R918 Other nonspecific abnormal finding of lung field: Secondary | ICD-10-CM

## 2014-12-03 DIAGNOSIS — I69322 Dysarthria following cerebral infarction: Secondary | ICD-10-CM | POA: Diagnosis not present

## 2014-12-03 DIAGNOSIS — R748 Abnormal levels of other serum enzymes: Secondary | ICD-10-CM | POA: Diagnosis not present

## 2014-12-03 DIAGNOSIS — G9340 Encephalopathy, unspecified: Secondary | ICD-10-CM | POA: Diagnosis not present

## 2014-12-03 DIAGNOSIS — E860 Dehydration: Secondary | ICD-10-CM | POA: Diagnosis present

## 2014-12-03 DIAGNOSIS — D649 Anemia, unspecified: Secondary | ICD-10-CM | POA: Diagnosis not present

## 2014-12-03 DIAGNOSIS — R739 Hyperglycemia, unspecified: Secondary | ICD-10-CM | POA: Diagnosis not present

## 2014-12-03 DIAGNOSIS — I25119 Atherosclerotic heart disease of native coronary artery with unspecified angina pectoris: Secondary | ICD-10-CM | POA: Diagnosis not present

## 2014-12-03 DIAGNOSIS — I350 Nonrheumatic aortic (valve) stenosis: Secondary | ICD-10-CM

## 2014-12-03 DIAGNOSIS — E1310 Other specified diabetes mellitus with ketoacidosis without coma: Secondary | ICD-10-CM | POA: Diagnosis not present

## 2014-12-03 DIAGNOSIS — E1011 Type 1 diabetes mellitus with ketoacidosis with coma: Secondary | ICD-10-CM | POA: Diagnosis not present

## 2014-12-03 DIAGNOSIS — Z6831 Body mass index (BMI) 31.0-31.9, adult: Secondary | ICD-10-CM

## 2014-12-03 DIAGNOSIS — I214 Non-ST elevation (NSTEMI) myocardial infarction: Secondary | ICD-10-CM

## 2014-12-03 DIAGNOSIS — E1110 Type 2 diabetes mellitus with ketoacidosis without coma: Secondary | ICD-10-CM

## 2014-12-03 DIAGNOSIS — I5023 Acute on chronic systolic (congestive) heart failure: Secondary | ICD-10-CM | POA: Diagnosis not present

## 2014-12-03 DIAGNOSIS — Z954 Presence of other heart-valve replacement: Secondary | ICD-10-CM | POA: Diagnosis not present

## 2014-12-03 DIAGNOSIS — E1169 Type 2 diabetes mellitus with other specified complication: Secondary | ICD-10-CM | POA: Diagnosis not present

## 2014-12-03 DIAGNOSIS — Z955 Presence of coronary angioplasty implant and graft: Secondary | ICD-10-CM

## 2014-12-03 DIAGNOSIS — I25118 Atherosclerotic heart disease of native coronary artery with other forms of angina pectoris: Secondary | ICD-10-CM | POA: Diagnosis not present

## 2014-12-03 LAB — COMPREHENSIVE METABOLIC PANEL
ALT: 16 U/L (ref 0–35)
AST: 26 U/L (ref 0–37)
Albumin: 3.9 g/dL (ref 3.5–5.2)
Alkaline Phosphatase: 99 U/L (ref 39–117)
Anion gap: 29 — ABNORMAL HIGH (ref 5–15)
BILIRUBIN TOTAL: 1.4 mg/dL — AB (ref 0.3–1.2)
BUN: 41 mg/dL — AB (ref 6–23)
CALCIUM: 8.7 mg/dL (ref 8.4–10.5)
CHLORIDE: 91 meq/L — AB (ref 96–112)
CO2: 8 mmol/L — AB (ref 19–32)
CREATININE: 2.06 mg/dL — AB (ref 0.50–1.10)
GFR calc non Af Amer: 26 mL/min — ABNORMAL LOW (ref >90)
GFR, EST AFRICAN AMERICAN: 31 mL/min — AB (ref >90)
GLUCOSE: 1047 mg/dL — AB (ref 70–99)
POTASSIUM: 6.5 mmol/L — AB (ref 3.5–5.1)
Sodium: 128 mmol/L — ABNORMAL LOW (ref 135–145)
TOTAL PROTEIN: 6.7 g/dL (ref 6.0–8.3)

## 2014-12-03 LAB — CBC WITH DIFFERENTIAL/PLATELET
Basophils Absolute: 0 10*3/uL (ref 0.0–0.1)
Basophils Relative: 0 % (ref 0–1)
EOS ABS: 0 10*3/uL (ref 0.0–0.7)
Eosinophils Relative: 0 % (ref 0–5)
HCT: 35.1 % — ABNORMAL LOW (ref 36.0–46.0)
Hemoglobin: 10.9 g/dL — ABNORMAL LOW (ref 12.0–15.0)
Lymphocytes Relative: 6 % — ABNORMAL LOW (ref 12–46)
Lymphs Abs: 1.8 10*3/uL (ref 0.7–4.0)
MCH: 27.7 pg (ref 26.0–34.0)
MCHC: 31.1 g/dL (ref 30.0–36.0)
MCV: 89.1 fL (ref 78.0–100.0)
Monocytes Absolute: 1.2 10*3/uL — ABNORMAL HIGH (ref 0.1–1.0)
Monocytes Relative: 4 % (ref 3–12)
Neutro Abs: 27 10*3/uL — ABNORMAL HIGH (ref 1.7–7.7)
Neutrophils Relative %: 90 % — ABNORMAL HIGH (ref 43–77)
Platelets: 407 10*3/uL — ABNORMAL HIGH (ref 150–400)
RBC: 3.94 MIL/uL (ref 3.87–5.11)
RDW: 16.6 % — ABNORMAL HIGH (ref 11.5–15.5)
WBC: 30 10*3/uL — AB (ref 4.0–10.5)

## 2014-12-03 LAB — CBG MONITORING, ED
Glucose-Capillary: >600 mg/dL (ref 70–99)
Glucose-Capillary: >600 mg/dL (ref 70–99)

## 2014-12-03 LAB — URINALYSIS, ROUTINE W REFLEX MICROSCOPIC
Bilirubin Urine: NEGATIVE
Hgb urine dipstick: NEGATIVE
Ketones, ur: 40 mg/dL — AB
Leukocytes, UA: NEGATIVE
NITRITE: NEGATIVE
PH: 5 (ref 5.0–8.0)
Protein, ur: NEGATIVE mg/dL
Specific Gravity, Urine: 1.024 (ref 1.005–1.030)
UROBILINOGEN UA: 0.2 mg/dL (ref 0.0–1.0)

## 2014-12-03 LAB — BLOOD GAS, ARTERIAL
ACID-BASE DEFICIT: 21.1 mmol/L — AB (ref 0.0–2.0)
Bicarbonate: 6.3 mEq/L — ABNORMAL LOW (ref 20.0–24.0)
DRAWN BY: 331471
O2 Saturation: 97 %
PCO2 ART: 18.6 mmHg — AB (ref 35.0–45.0)
PH ART: 7.158 — AB (ref 7.350–7.450)
Patient temperature: 98.6
TCO2: 6.2 mmol/L (ref 0–100)
pO2, Arterial: 120 mmHg — ABNORMAL HIGH (ref 80.0–100.0)

## 2014-12-03 LAB — PROTIME-INR
INR: 1.4 (ref 0.00–1.49)
Prothrombin Time: 17.3 seconds — ABNORMAL HIGH (ref 11.6–15.2)

## 2014-12-03 LAB — URINE MICROSCOPIC-ADD ON: Urine-Other: NONE SEEN

## 2014-12-03 LAB — TROPONIN I: TROPONIN I: 0.05 ng/mL — AB (ref <0.031)

## 2014-12-03 LAB — LACTIC ACID, PLASMA: Lactic Acid, Venous: 7.5 mmol/L — ABNORMAL HIGH (ref 0.5–2.2)

## 2014-12-03 MED ORDER — SODIUM CHLORIDE 0.9 % IV SOLN: INTRAVENOUS | Status: DC

## 2014-12-03 MED ORDER — SODIUM CHLORIDE 0.9 % IV SOLN
1.0000 g | Freq: Once | INTRAVENOUS | Status: DC
  Administered 2014-12-03: 1 g via INTRAVENOUS
  Filled 2014-12-03: qty 10

## 2014-12-03 MED ORDER — PIPERACILLIN-TAZOBACTAM 3.375 G IVPB: 3.3750 g | Freq: Three times a day (TID) | INTRAVENOUS | Status: DC

## 2014-12-03 MED ORDER — SODIUM CHLORIDE 0.9 % IV BOLUS (SEPSIS)
1000.0000 mL | INTRAVENOUS | Status: AC
  Administered 2014-12-03: 1000 mL via INTRAVENOUS

## 2014-12-03 MED ORDER — DEXTROSE-NACL 5-0.45 % IV SOLN: INTRAVENOUS | Status: DC

## 2014-12-03 MED ORDER — DEXTROSE-NACL 5-0.45 % IV SOLN
INTRAVENOUS | Status: DC
  Administered 2014-12-04: 125 mL/h via INTRAVENOUS

## 2014-12-03 MED ORDER — SODIUM CHLORIDE 0.9 % IV BOLUS (SEPSIS)
1000.0000 mL | INTRAVENOUS | Status: AC
Start: 2014-12-03 — End: 2014-12-03
  Administered 2014-12-03: 1000 mL via INTRAVENOUS

## 2014-12-03 MED ORDER — DEXTROSE 50 % IV SOLN
50.0000 mL | Freq: Once | INTRAVENOUS | Status: DC
  Filled 2014-12-03: qty 50

## 2014-12-03 MED ORDER — SODIUM CHLORIDE 0.9 % IV SOLN
INTRAVENOUS | Status: DC
  Administered 2014-12-03 – 2014-12-06 (×2): via INTRAVENOUS

## 2014-12-03 MED ORDER — VANCOMYCIN HCL IN DEXTROSE 1-5 GM/200ML-% IV SOLN: 1000.0000 mg | INTRAVENOUS | Status: DC

## 2014-12-03 MED ORDER — SODIUM CHLORIDE 0.9 % IV SOLN
INTRAVENOUS | Status: DC
  Administered 2014-12-03: 9.4 [IU]/h via INTRAVENOUS
  Filled 2014-12-03: qty 2.5

## 2014-12-03 MED ORDER — SODIUM CHLORIDE 0.9 % IV BOLUS (SEPSIS)
1000.0000 mL | INTRAVENOUS | Status: AC
  Administered 2014-12-04: 1000 mL via INTRAVENOUS

## 2014-12-03 MED ORDER — ENOXAPARIN SODIUM 80 MG/0.8ML ~~LOC~~ SOLN
1.0000 mg/kg | Freq: Two times a day (BID) | SUBCUTANEOUS | Status: DC
  Administered 2014-12-04 – 2014-12-05 (×3): 75 mg via SUBCUTANEOUS
  Filled 2014-12-03 (×5): qty 0.8

## 2014-12-03 MED ORDER — SODIUM BICARBONATE 8.4 % IV SOLN
50.0000 meq | Freq: Once | INTRAVENOUS | Status: AC
  Administered 2014-12-03: 50 meq via INTRAVENOUS
  Filled 2014-12-03: qty 50

## 2014-12-03 MED ORDER — WARFARIN - PHARMACIST DOSING INPATIENT: Freq: Every day | Status: DC

## 2014-12-03 MED ORDER — PIPERACILLIN-TAZOBACTAM 3.375 G IVPB
3.3750 g | Freq: Three times a day (TID) | INTRAVENOUS | Status: DC
  Administered 2014-12-04 – 2014-12-09 (×18): 3.375 g via INTRAVENOUS
  Filled 2014-12-03 (×21): qty 50

## 2014-12-03 MED ORDER — VANCOMYCIN HCL 10 G IV SOLR
1250.0000 mg | Freq: Once | INTRAVENOUS | Status: AC
  Administered 2014-12-03: 1250 mg via INTRAVENOUS
  Filled 2014-12-03: qty 1250

## 2014-12-03 MED ORDER — SODIUM CHLORIDE 0.9 % IV SOLN
INTRAVENOUS | Status: DC | PRN
Start: 2014-12-03 — End: 2014-12-05

## 2014-12-03 MED ORDER — INSULIN ASPART 100 UNIT/ML ~~LOC~~ SOLN
5.0000 [IU] | Freq: Once | SUBCUTANEOUS | Status: AC
  Administered 2014-12-03: 5 [IU] via INTRAVENOUS
  Filled 2014-12-03: qty 1

## 2014-12-03 MED ORDER — SODIUM CHLORIDE 0.9 % IV SOLN
1.0000 g | Freq: Once | INTRAVENOUS | Status: AC
  Filled 2014-12-03: qty 10

## 2014-12-03 MED ORDER — PIPERACILLIN-TAZOBACTAM 3.375 G IVPB
3.3750 g | Freq: Once | INTRAVENOUS | Status: AC
  Administered 2014-12-03: 3.375 g via INTRAVENOUS
  Filled 2014-12-03: qty 50

## 2014-12-03 MED ORDER — PANTOPRAZOLE SODIUM 40 MG IV SOLR
40.0000 mg | Freq: Every day | INTRAVENOUS | Status: DC
  Administered 2014-12-04 (×2): 40 mg via INTRAVENOUS
  Filled 2014-12-03 (×2): qty 40

## 2014-12-03 MED ORDER — WARFARIN SODIUM 10 MG PO TABS
10.0000 mg | ORAL_TABLET | Freq: Once | ORAL | Status: AC
  Administered 2014-12-04: 10 mg via ORAL
  Filled 2014-12-03: qty 1

## 2014-12-03 MED ORDER — SODIUM CHLORIDE 0.9 % IV BOLUS (SEPSIS)
3000.0000 mL | Freq: Once | INTRAVENOUS | Status: AC
  Administered 2014-12-03: 3000 mL via INTRAVENOUS

## 2014-12-03 MED ORDER — SODIUM CHLORIDE 0.9 % IV SOLN
INTRAVENOUS | Status: DC
  Administered 2014-12-03: 5.4 [IU]/h via INTRAVENOUS
  Filled 2014-12-03: qty 2.5

## 2014-12-03 NOTE — ED Notes (Signed)
Morene Antu RN at bedside at present time.

## 2014-12-03 NOTE — ED Notes (Signed)
Bed: IO27 Expected date:  Expected time:  Means of arrival:  Comments: Triage 4

## 2014-12-03 NOTE — ED Notes (Signed)
MD at bedside. 

## 2014-12-03 NOTE — Progress Notes (Signed)
ANTIBIOTIC CONSULT NOTE - INITIAL  Pharmacy Consult for Vancomycin  Indication: Sepsis  Allergies  Allergen Reactions  . Celebrex [Celecoxib] Rash  . Detrol [Tolterodine] Hives    Patient Measurements:   Wt= 75 kg  Vital Signs: Temp: 97.3 F (36.3 C) (01/15 2003) Temp Source: Oral (01/15 2003) BP: 99/47 mmHg (01/15 2200) Pulse Rate: 50 (01/15 2140) Intake/Output from previous day:   Intake/Output from this shift: Total I/O In: 31159.4 [I.V.:9.4; IV Piggyback:31150] Out: -   Labs:  Recent Labs  12/03/14 1618 12/03/14 1814  WBC 30.0*  --   HGB 10.9*  --   PLT 407*  --   CREATININE  --  2.06*   Estimated Creatinine Clearance: 30.7 mL/min (by C-G formula based on Cr of 2.06). No results for input(s): VANCOTROUGH, VANCOPEAK, VANCORANDOM, GENTTROUGH, GENTPEAK, GENTRANDOM, TOBRATROUGH, TOBRAPEAK, TOBRARND, AMIKACINPEAK, AMIKACINTROU, AMIKACIN in the last 72 hours.   Microbiology: No results found for this or any previous visit (from the past 720 hour(s)).  Medical History: Past Medical History  Diagnosis Date  . CAD (coronary artery disease)   . Obesity   . Hypercholesteremia   . HTN (hypertension)   . Dyslipidemia   . Stroke syndrome   . Aortic stenosis   . Thrombophlebitis   . Diabetes   . Hyperlipidemia   . Rheumatoid arthritis(714.0)     Medications:  Scheduled:  . pantoprazole (PROTONIX) IV  40 mg Intravenous QHS  . [START ON 12/04/2014] piperacillin-tazobactam (ZOSYN)  IV  3.375 g Intravenous Q8H  . [START ON 12/04/2014] vancomycin  1,000 mg Intravenous Q24H  . warfarin  10 mg Oral Once  . [START ON 12/04/2014] Warfarin - Pharmacist Dosing Inpatient   Does not apply q1800   Infusions:  . sodium chloride    . dextrose 5 % and 0.45% NaCl Stopped (12/03/14 2057)  . insulin (NOVOLIN-R) infusion 9.4 Units/hr (12/03/14 2100)   Assessment: 2 yoF admitted with severe sepsis and acute organ dysfunction.  Zosyn per MD and Vancomyin per Rx for  Sepsis.  Goal of Therapy:  Vancomycin trough level 15-20 mcg/ml  Plan:   Vancomycin 1250mg  x1 in ED then 1Gm IV q24h  F/u SCr/levels/cultures as needed  12/03/2014,11:33 PM

## 2014-12-03 NOTE — ED Notes (Signed)
Pt alert and oriented to self, place, situation. Pt reports high sugar 3 days with sugar over 400.

## 2014-12-03 NOTE — ED Provider Notes (Signed)
CSN: [id number]     Arrival date & time 12/03/14  1445 History   First MD Initiated Contact with Patient 12/03/14 1543     Chief Complaint  Patient presents with  . Hyperglycemia     (Consider location/radiation/quality/duration/timing/severity/associated sxs/prior Treatment) HPI Comments: Level V caveat due to altered mental status.  Patient is a 54 y.o. female presenting with vomiting. The history is provided by the patient.  Emesis Severity:  Unable to specify Duration:  1 day Timing:  Constant Quality:  Stomach contents Progression:  Worsening Chronicity:  New Relieved by:  Nothing Worsened by:  Nothing tried Ineffective treatments:  None tried Associated symptoms: no abdominal pain, no diarrhea and no headaches     Past Medical History  Diagnosis Date  . CAD (coronary artery disease)   . Obesity   . Hypercholesteremia   . HTN (hypertension)   . Dyslipidemia   . Stroke syndrome   . Aortic stenosis   . Thrombophlebitis   . Diabetes   . Hyperlipidemia   . Rheumatoid arthritis(714.0)    Past Surgical History  Procedure Laterality Date  . Cesarean section    . Foot fracture surgery    . Knee surgery    . Neuroplasty / transposition median nerve at carpal tunnel     Family History  Problem Relation Age of Onset  . Stroke    . Heart disease Mother   . Heart disease Sister    History  Substance Use Topics  . Smoking status: Never Smoker   . Smokeless tobacco: Never Used  . Alcohol Use: No   OB History    No data available     Review of Systems  Constitutional: Negative for fever and fatigue.  HENT: Negative for congestion and drooling.   Eyes: Negative for pain.  Respiratory: Negative for cough and shortness of breath.   Cardiovascular: Negative for chest pain.  Gastrointestinal: Positive for nausea and vomiting. Negative for abdominal pain and diarrhea.  Genitourinary: Negative for dysuria and hematuria.  Musculoskeletal: Negative for back pain,  gait problem and neck pain.  Skin: Negative for color change.  Neurological: Negative for dizziness and headaches.  Hematological: Negative for adenopathy.  Psychiatric/Behavioral: Negative for behavioral problems.  All other systems reviewed and are negative.     Allergies  Celebrex and Detrol  Home Medications   Prior to Admission medications   Medication Sig Start Date End Date Taking? Authorizing Provider  aspirin 81 MG tablet Take 1 tablet (81 mg total) by mouth daily. 10/11/14   Huey Bienenstock, MD  baclofen (LIORESAL) 10 MG tablet Take 1 tablet (10 mg total) by mouth 2 (two) times daily. 10/29/14   Baker Pierini, FNP  escitalopram (LEXAPRO) 20 MG tablet Take 20 mg by mouth daily.    Historical Provider, MD  glucose blood [facility] BLOOD GLUCOSE TEST) test strip Use to check blood sugar twice daily 07/16/14   Romero Belling, MD  Insulin Detemir (LEVEMIR FLEXTOUCH) 100 UNIT/ML Pen Inject 25 Units into the skin every morning. and pen needles 1/day,patient to increase Lantus 5 units per day for every reading FINGERSTICK more than 200 , until she reaches her home regimen dose of 50 units subcutaneous daily. Patient taking differently: Inject 50 Units into the skin every morning.  10/04/14   Huey Bienenstock, MD  insulin lispro (HUMALOG KWIKPEN) 100 UNIT/ML KiwkPen Inject 5 units with evening meal 10/22/14   Romero Belling, MD  methotrexate (RHEUMATREX) 2.5 MG tablet Take 1 tablet (2.5 mg  total) by mouth 2 (two) times a week. Caution:Chemotherapy. Protect from light. 10/12/14   Baker Pierini, FNP  omeprazole (PRILOSEC) 40 MG capsule TAKE 1 CAPSULE (40 MG TOTAL) BY MOUTH DAILY. 11/05/14   Nelwyn Salisbury, MD  ONE TOUCH ULTRA TEST test strip TEST 5 TIMES A DAY, AND LANCETS 2/DAY 250.03 Patient taking differently: TEST 5 TIMES A DAY  250.03 10/18/14   Romero Belling, MD  pravastatin (PRAVACHOL) 20 MG tablet TAKE 1 TABLET EVERY DAY 06/01/14   Baker Pierini, FNP  promethazine (PHENERGAN)  25 MG tablet Take 1 tablet (25 mg total) by mouth every 4 (four) hours as needed for nausea or vomiting. 10/08/14   Nelwyn Salisbury, MD  solifenacin (VESICARE) 5 MG tablet Take 1 tablet (5 mg total) by mouth daily. 06/04/14   Baker Pierini, FNP  traMADol (ULTRAM) 50 MG tablet TAKE 1 OR 2 TABLETS BY MOUTH EVERY 8 HOURS AS NEEDED 08/04/14   Baker Pierini, FNP  VESICARE 5 MG tablet TAKE 1/2 TABLET ONCE DAILY 10/22/14   Baker Pierini, FNP  warfarin (COUMADIN) 5 MG tablet Take as directed by anticoagulation clinic 11/25/14   Baker Pierini, FNP  warfarin (COUMADIN) 7.5 MG tablet Take 1 tablet (7.5 mg total) by mouth every Wednesday. As instructed by  coagulation clinic, warfarin 5 mg oral daily except Wednesday, on Wednesday 7.5 mg oral , continue to follow with the clinic on discharge 10/04/14   Huey Bienenstock, MD  zolpidem (AMBIEN) 10 MG tablet Take 1 tablet by mouth at bedtime. 09/14/14   Historical Provider, MD   BP 80/66 mmHg Physical Exam  Constitutional: She appears well-developed and well-nourished.  HENT:  Head: Normocephalic.  Mouth/Throat: No oropharyngeal exudate.  Eyes: Conjunctivae and EOM are normal. Pupils are equal, round, and reactive to light.  Neck: Normal range of motion. Neck supple.  Cardiovascular: Regular rhythm, normal heart sounds and intact distal pulses.  Exam reveals no friction rub.   HR 120's  Pulmonary/Chest: Effort normal and breath sounds normal. No respiratory distress. She has no wheezes.  Abdominal: Soft. Bowel sounds are normal. There is no tenderness. There is no rebound and no guarding.  Musculoskeletal: Normal range of motion. She exhibits no edema or tenderness.  Neurological: She is alert.  The patient appears drowsy and has difficulty complying with neurologic examination. She is alert and oriented 1. She has 5 out of 5 strength in her right upper and right lower extremity. She is 3-5 strength in her left upper and left lower extremity.   Skin: Skin is warm and dry.  Psychiatric: She has a normal mood and affect. Her behavior is normal.  Nursing note and vitals reviewed.   ED Course  Procedures (including critical care time) Labs Review Labs Reviewed  CBC WITH DIFFERENTIAL - Abnormal; Notable for the following:    WBC 30.0 (*)    Hemoglobin 10.9 (*)    HCT 35.1 (*)    RDW 16.6 (*)    Platelets 407 (*)    Neutrophils Relative % 90 (*)    Lymphocytes Relative 6 (*)    Neutro Abs 27.0 (*)    Monocytes Absolute 1.2 (*)    All other components within normal limits  PROTIME-INR - Abnormal; Notable for the following:    Prothrombin Time 17.3 (*)    All other components within normal limits  URINALYSIS, ROUTINE W REFLEX MICROSCOPIC - Abnormal; Notable for the following:    Glucose, UA >1000 (*)  Ketones, ur 40 (*)    All other components within normal limits  LACTIC ACID, PLASMA - Abnormal; Notable for the following:    Lactic Acid, Venous 7.5 (*)    All other components within normal limits  BLOOD GAS, ARTERIAL - Abnormal; Notable for the following:    pH, Arterial 7.158 (*)    pCO2 arterial 18.6 (*)    pO2, Arterial 120.0 (*)    Bicarbonate 6.3 (*)    Acid-base deficit 21.1 (*)    All other components within normal limits  COMPREHENSIVE METABOLIC PANEL - Abnormal; Notable for the following:    Sodium 128 (*)    Potassium 6.5 (*)    Chloride 91 (*)    CO2 8 (*)    Glucose, Bld 1047 (*)    BUN 41 (*)    Creatinine, Ser 2.06 (*)    Total Bilirubin 1.4 (*)    GFR calc non Af Amer 26 (*)    GFR calc Af Amer 31 (*)    Anion gap 29 (*)    All other components within normal limits  TROPONIN I - Abnormal; Notable for the following:    Troponin I 0.05 (*)    All other components within normal limits  URINALYSIS, ROUTINE W REFLEX MICROSCOPIC - Abnormal; Notable for the following:    APPearance CLOUDY (*)    Glucose, UA >1000 (*)    Hgb urine dipstick SMALL (*)    Ketones, ur >80 (*)    All other components  within normal limits  BASIC METABOLIC PANEL - Abnormal; Notable for the following:    Chloride 115 (*)    CO2 14 (*)    Glucose, Bld 326 (*)    BUN 31 (*)    Creatinine, Ser 1.38 (*)    Calcium 7.1 (*)    GFR calc non Af Amer 43 (*)    GFR calc Af Amer 50 (*)    All other components within normal limits  BASIC METABOLIC PANEL - Abnormal; Notable for the following:    Potassium 3.1 (*)    Chloride 118 (*)    CO2 15 (*)    Glucose, Bld 132 (*)    BUN 27 (*)    Calcium 7.1 (*)    GFR calc non Af Amer 59 (*)    GFR calc Af Amer 68 (*)    All other components within normal limits  BASIC METABOLIC PANEL - Abnormal; Notable for the following:    Chloride 119 (*)    CO2 16 (*)    Glucose, Bld 128 (*)    BUN 24 (*)    Calcium 7.0 (*)    GFR calc non Af Amer 58 (*)    GFR calc Af Amer 67 (*)    All other components within normal limits  TROPONIN I - Abnormal; Notable for the following:    Troponin I 5.63 (*)    All other components within normal limits  TROPONIN I - Abnormal; Notable for the following:    Troponin I 14.38 (*)    All other components within normal limits  GLUCOSE, CAPILLARY - Abnormal; Notable for the following:    Glucose-Capillary >600 (*)    All other components within normal limits  GLUCOSE, CAPILLARY - Abnormal; Notable for the following:    Glucose-Capillary 597 (*)    All other components within normal limits  GLUCOSE, CAPILLARY - Abnormal; Notable for the following:    Glucose-Capillary 539 (*)  All other components within normal limits  GLUCOSE, CAPILLARY - Abnormal; Notable for the following:    Glucose-Capillary 482 (*)    All other components within normal limits  GLUCOSE, CAPILLARY - Abnormal; Notable for the following:    Glucose-Capillary 374 (*)    All other components within normal limits  GLUCOSE, CAPILLARY - Abnormal; Notable for the following:    Glucose-Capillary 350 (*)    All other components within normal limits  GLUCOSE, CAPILLARY  - Abnormal; Notable for the following:    Glucose-Capillary 310 (*)    All other components within normal limits  GLUCOSE, CAPILLARY - Abnormal; Notable for the following:    Glucose-Capillary 268 (*)    All other components within normal limits  GLUCOSE, CAPILLARY - Abnormal; Notable for the following:    Glucose-Capillary 221 (*)    All other components within normal limits  PHOSPHORUS - Abnormal; Notable for the following:    Phosphorus 1.2 (*)    All other components within normal limits  GLUCOSE, CAPILLARY - Abnormal; Notable for the following:    Glucose-Capillary 127 (*)    All other components within normal limits  GLUCOSE, CAPILLARY - Abnormal; Notable for the following:    Glucose-Capillary 121 (*)    All other components within normal limits  CBG MONITORING, ED - Abnormal; Notable for the following:    Glucose-Capillary >600 (*)    All other components within normal limits  CBG MONITORING, ED - Abnormal; Notable for the following:    Glucose-Capillary >600 (*)    All other components within normal limits  CULTURE, BLOOD (ROUTINE X 2)  CULTURE, BLOOD (ROUTINE X 2)  MRSA PCR SCREENING  URINE MICROSCOPIC-ADD ON  LACTIC ACID, PLASMA  PROCALCITONIN  URINE MICROSCOPIC-ADD ON  MAGNESIUM  GLUCOSE, CAPILLARY  GLUCOSE, CAPILLARY  BASIC METABOLIC PANEL  BASIC METABOLIC PANEL  BASIC METABOLIC PANEL  TROPONIN I  TROPONIN I  MAGNESIUM  PHOSPHORUS    Imaging Review Dg Chest 1 View  12/04/2014   CLINICAL DATA:  Diabetic ketoacidosis.  EXAM: CHEST - 1 VIEW  COMPARISON:  12/03/2014  FINDINGS: Heart size is within normal limits. No evidence of pulmonary infiltrate or pleural effusion. Right jugular central venous catheter tip remains in the proximal to mid right atrium. Prior median sternotomy noted.  IMPRESSION: No acute findings.   Electronically Signed   By: Myles Rosenthal M.D.   On: 12/04/2014 07:58   Dg Chest 1 View  12/03/2014   CLINICAL DATA:  Altered mental status.  EXAM:  CHEST - 1 VIEW  COMPARISON:  10/02/2014.  FINDINGS: Interval mildly enlarged cardiac silhouette. Clear lungs with normal vascularity. Stable median sternotomy wires and prosthetic aortic valve. Mild scoliosis.  IMPRESSION: Interval mild cardiomegaly.   Electronically Signed   By: Gordan Payment M.D.   On: 12/03/2014 18:52   Ct Head Wo Contrast  12/03/2014   CLINICAL DATA:  Two day history of vomiting and slurred speech. Acute hyperglycemia, with blood sugars greater than 600. Prior history of stroke.  EXAM: CT HEAD WITHOUT CONTRAST  TECHNIQUE: Contiguous axial images were obtained from the base of the skull through the vertex without intravenous contrast.  COMPARISON:  09/30/2014, 03/08/2014.  FINDINGS: Encephalomalacia involving the right frontal, temporal and parietal lobes, unchanged. Encephalomalacia involving the occipital lobes bilaterally, left greater than right, unchanged. Focal encephalomalacia involving the left posterior frontal lobe, unchanged Ventricular system normal in size and appearance for age. Severe changes of small vessel disease of the white matter, unchanged.  No mass lesion. No midline shift. No acute hemorrhage or hematoma. No extra-axial fluid collections. No evidence of acute infarction.  No skull fracture or other focal osseous abnormality involving the skull. Visualized paranasal sinuses, bilateral mastoid air cells and bilateral middle ear cavities well-aerated. Extensive bilateral carotid siphon atherosclerosis.  IMPRESSION: 1. No acute intracranial abnormality. 2. Multiple cortical strokes including a large right MCA distribution stroke, bilateral PCA distribution strokes, and a focal left MCA distribution stroke.   Electronically Signed   By: Hulan Saas M.D.   On: 12/03/2014 16:59   Dg Chest Port 1 View  12/04/2014   CLINICAL DATA:  Central line placement.  Initial encounter.  EXAM: PORTABLE CHEST - 1 VIEW  COMPARISON:  Chest radiograph performed earlier today at 6:40 p.m.   FINDINGS: The patient's right IJ line is seen ending about the cavoatrial junction.  The lungs are well-aerated. Mild vascular congestion is noted, with mildly increased interstitial markings, raising concern for mild interstitial edema. There is no evidence of pleural effusion or pneumothorax.  The cardiomediastinal silhouette is borderline normal in size. The patient is status post median sternotomy. No acute osseous abnormalities are seen.  IMPRESSION: 1. Right IJ line noted ending about the cavoatrial junction. 2. Mild vascular congestion, with mildly increased interstitial markings, raising concern for mild interstitial edema.   Electronically Signed   By: Roanna Raider M.D.   On: 12/04/2014 02:37     EKG Interpretation None      Date: 12/03/14  Rate: 105  Rhythm: sinus tachycardia  QRS Axis: indeterminate  Intervals: normal  ST/T Wave abnormalities: early repolarization  Conduction Disutrbances:none  Narrative Interpretation: No ST or T wave changes consistent with ischemia.    Old EKG Reviewed: unchanged  CRITICAL CARE Performed by: Purvis Sheffield, S Total critical care time: 1 hour Critical care time was exclusive of separately billable procedures and treating other patients. Critical care was necessary to treat or prevent imminent or life-threatening deterioration. Critical care was time spent personally by me on the following activities: development of treatment plan with patient and/or surrogate as well as nursing, discussions with consultants, evaluation of patient's response to treatment, examination of patient, obtaining history from patient or surrogate, ordering and performing treatments and interventions, ordering and review of laboratory studies, ordering and review of radiographic studies, pulse oximetry and re-evaluation of patient's condition.  MDM   Final diagnoses:  Altered mental status  Diabetic ketoacidosis associated with other specified diabetes mellitus   Lactic acidosis  Renal failure  Elevated troponin  Hyperkalemia    3:54 PM 54 y.o. female w hx of CAD, aortic stenosis s/p valve replacement (2012), CVA w/ left sided spastic hemiplegia, DM who presents with vomiting and elevated blood sugar. She is very drowsy and an incredibly poor historian on exam. She has had similar presentations with DKA and metabolic encephalopathy. I suspect this is what is going on. She is able to tell me her name and move all her extremities. She does have 3/5 strength in her left upper and left lower extremity which is consistent with her known hemiplegia on that side. Her initial blood pressure is 80/66.   Pt covered for sepsis w/ Vanc/Zosyn. PH of 7.1. Lactic acid of 7.5. Elevated troponin but no ecg changes. Renal failure. Will admit to ICU by hospitalist.   Purvis Sheffield, MD 12/04/14 [phone]

## 2014-12-03 NOTE — H&P (Addendum)
PCP:  CAMPBELL, Sherran Needs, FNP  [facility]   Chief Complaint:  altered  HPI: Madison [name] is a 54 y.o. female   has a past medical history of CAD (coronary artery disease); Obesity; Hypercholesteremia; HTN (hypertension); Dyslipidemia; Stroke syndrome; Aortic stenosis; Thrombophlebitis; Diabetes; Hyperlipidemia; and Rheumatoid arthritis(714.0).   Presented with  Patient has hx of DM 1, used to be on insuline pump until 2014 when she had a stroke and since then was not able to use. Has hx of  bicuspid AV sp Replacement with echo showing multiple leaks and strands. On coumadin but continues to have strokes. Not therapeutic suspect non complaint. Patient presented to ER with vomiting . Patietn at first was minimaly awake. Was found to have BG 1000, evidence of severe DKA. Trop 0.05  ECG unchanged. Patient initially poorly responsive BP down to 80/40 and as low as 60/20 given 6L and started on Glucose-stabalizer. BP continues to stay labile but have somewhat improved with some improved mention   Hospitalist was called for admission for severe DKA and sepsis with end organ damage.   Review of Systems:  Not able to obtain. Past Medical History: Past Medical History  Diagnosis Date  . CAD (coronary artery disease)   . Obesity   . Hypercholesteremia   . HTN (hypertension)   . Dyslipidemia   . Stroke syndrome   . Aortic stenosis   . Thrombophlebitis   . Diabetes   . Hyperlipidemia   . Rheumatoid arthritis(714.0)    Past Surgical History  Procedure Laterality Date  . Cesarean section    . Foot fracture surgery    . Knee surgery    . Neuroplasty / transposition median nerve at carpal tunnel       Medications: Prior to Admission medications   Medication Sig Start Date End Date Taking? Authorizing Provider  aspirin 81 MG tablet Take 1 tablet (81 mg total) by mouth daily. 10/11/14  Yes Huey Bienenstock, MD  baclofen (LIORESAL) 10 MG tablet Take 1  tablet (10 mg total) by mouth 2 (two) times daily. 10/29/14  Yes Baker Pierini, FNP  escitalopram (LEXAPRO) 20 MG tablet Take 20 mg by mouth daily.   Yes Historical Provider, MD  Insulin Detemir (LEVEMIR FLEXTOUCH) 100 UNIT/ML Pen Inject 25 Units into the skin every morning. and pen needles 1/day,patient to increase Lantus 5 units per day for every reading FINGERSTICK more than 200 , until she reaches her home regimen dose of 50 units subcutaneous daily. 10/04/14  Yes Dawood Elgergawy, MD  insulin lispro (HUMALOG KWIKPEN) 100 UNIT/ML KiwkPen Inject 5 units with evening meal 10/22/14  Yes Romero Belling, MD  methotrexate (RHEUMATREX) 2.5 MG tablet Take 1 tablet (2.5 mg total) by mouth 2 (two) times a week. Caution:Chemotherapy. Protect from light. 10/12/14  Yes Baker Pierini, FNP  omeprazole (PRILOSEC) 40 MG capsule TAKE 1 CAPSULE (40 MG TOTAL) BY MOUTH DAILY. 11/05/14  Yes Nelwyn Salisbury, MD  ONE TOUCH ULTRA TEST test strip TEST 5 TIMES A DAY, AND LANCETS 2/DAY 250.03 Patient taking differently: TEST 5 TIMES A DAY  250.03 10/18/14  Yes Romero Belling, MD  pravastatin (PRAVACHOL) 20 MG tablet TAKE 1 TABLET EVERY DAY 06/01/14  Yes Baker Pierini, FNP  promethazine (PHENERGAN) 25 MG tablet Take 1 tablet (25 mg total) by mouth every 4 (four) hours as needed for nausea or vomiting. 10/08/14  Yes Nelwyn Salisbury, MD  traMADol (ULTRAM) 50 MG tablet TAKE 1 OR 2 TABLETS  BY MOUTH EVERY 8 HOURS AS NEEDED 08/04/14  Yes Baker Pierini, FNP  VESICARE 5 MG tablet TAKE 1/2 TABLET ONCE DAILY 10/22/14  Yes Baker Pierini, FNP  warfarin (COUMADIN) 7.5 MG tablet Take 5-7.5 mg by mouth daily. Take 5 mg Daily By Mouth on Mon, Tues, Thurs, Fri, Sat & Sun, Take 7.5 mg By Mouth on Wed.   Yes Historical Provider, MD  zolpidem (AMBIEN) 10 MG tablet Take 1 tablet by mouth at bedtime. 09/14/14  Yes Historical Provider, MD  glucose blood (EMBRACE BLOOD GLUCOSE TEST) test strip Use to check blood sugar twice  daily Patient not taking: Reported on 12/03/2014 07/16/14   Romero Belling, MD  solifenacin (VESICARE) 5 MG tablet Take 1 tablet (5 mg total) by mouth daily. Patient not taking: Reported on 12/03/2014 06/04/14   Baker Pierini, FNP  warfarin (COUMADIN) 5 MG tablet Take as directed by anticoagulation clinic Patient not taking: Reported on 12/03/2014 11/25/14   Baker Pierini, FNP  warfarin (COUMADIN) 7.5 MG tablet Take 1 tablet (7.5 mg total) by mouth every Wednesday. As instructed by  coagulation clinic, warfarin 5 mg oral daily except Wednesday, on Wednesday 7.5 mg oral , continue to follow with the clinic on discharge Patient not taking: Reported on 12/03/2014 10/04/14   Huey Bienenstock, MD    Allergies:   Allergies  Allergen Reactions  . Celebrex [Celecoxib] Rash  . Detrol [Tolterodine] Hives    Social History:   Lives at home alone,     reports that she has never smoked. She has never used smokeless tobacco. She reports that she does not drink alcohol or use illicit drugs.    Family History: family history includes Heart disease in her mother and sister; Stroke in an other family member.    Physical Exam: Patient Vitals for the past 24 hrs:  BP Temp Temp src Pulse Resp SpO2  12/03/14 2155 (!) 93/38 mmHg - - - 19 -  12/03/14 2150 (!) 93/39 mmHg - - - (!) 31 -  12/03/14 2145 (!) 86/44 mmHg - - - 25 -  12/03/14 2140 (!) 85/37 mmHg - - (!) 50 (!) 30 100 %  12/03/14 2135 (!) 79/26 mmHg - - - (!) 29 -  12/03/14 2130 (!) 85/29 mmHg - - - (!) 35 -  12/03/14 2115 (!) 79/37 mmHg - - - (!) 33 -  12/03/14 2100 (!) 101/46 mmHg - - - (!) 31 -  12/03/14 2045 (!) 80/59 mmHg - - - 25 -  12/03/14 2030 (!) 91/32 mmHg - - (!) 123 26 100 %  12/03/14 2015 (!) 94/39 mmHg - - (!) 121 22 99 %  12/03/14 2003 (!) 116/102 mmHg 97.3 F (36.3 C) Oral (!) 124 (!) 30 100 %  12/03/14 1945 98/78 mmHg - - (!) 124 19 97 %  12/03/14 1930 (!) 105/42 mmHg - - (!) 122 (!) 34 100 %  12/03/14 1917 (!) 98/35  mmHg - - (!) 121 (!) 28 99 %  12/03/14 1915 (!) 98/35 mmHg - - - 16 -  12/03/14 1900 (!) 106/39 mmHg - - - 26 -  12/03/14 1855 95/55 mmHg - - (!) 122 26 100 %  12/03/14 1823 (!) 104/40 mmHg - - (!) 124 24 100 %  12/03/14 1730 (!) 102/41 mmHg - - 114 16 100 %  12/03/14 1717 (!) 112/41 mmHg - - 113 20 100 %  12/03/14 1658 (!) 103/45 mmHg - - 111 20 100 %  12/03/14 1630 - 97.8 F (36.6 C) Rectal - - -  12/03/14 1629 (!) 117/45 mmHg - - 106 20 100 %  12/03/14 1625 (!) 111/46 mmHg - - 104 24 96 %  12/03/14 1549 (!) 80/66 mmHg - - - - -    1. General:  Poorly responcive 2. Psychological: somnolent non Oriented 3. Head/ENT:   Dry Mucous Membranes                          Head Non traumatic, neck supple                          Normal  Dentition 4. SKIN:   decreased Skin turgor,  Skin clean Dry and intact no rash 5. Heart: Regular rate and rhythm no Murmur, Rub or gallop 6. Lungs: Clear to auscultation bilaterally, no wheezes or crackles   7. Abdomen: Soft, non-tender, Non distended 8. Lower extremities: no clubbing, cyanosis, or edema 9. Neurologically - Left side weakness unchanged from prior 10. MSK: decreased range of motion  body mass index is unknown because there is no weight on file.   Labs on Admission:   Results for orders placed or performed during the hospital encounter of 12/03/14 (from the past 24 hour(s))  Blood gas, arterial     Status: Abnormal   Collection Time: 12/03/14  4:15 PM  Result Value Ref Range   O2 Content ROOM AIR L/min   pH, Arterial 7.158 (LL) 7.350 - 7.450   pCO2 arterial 18.6 (LL) 35.0 - 45.0 mmHg   pO2, Arterial 120.0 (H) 80.0 - 100.0 mmHg   Bicarbonate 6.3 (L) 20.0 - 24.0 mEq/L   TCO2 6.2 0 - 100 mmol/L   Acid-base deficit 21.1 (H) 0.0 - 2.0 mmol/L   O2 Saturation 97.0 %   Patient temperature 98.6    Collection site RIGHT RADIAL    Drawn by [id number]    Sample type ARTERIAL DRAW    Allens test (pass/fail) PASS PASS  CBC with Differential      Status: Abnormal   Collection Time: 12/03/14  4:18 PM  Result Value Ref Range   WBC 30.0 (H) 4.0 - 10.5 K/uL   RBC 3.94 3.87 - 5.11 MIL/uL   Hemoglobin 10.9 (L) 12.0 - 15.0 g/dL   HCT 04.5 (L) 40.9 - 81.1 %   MCV 89.1 78.0 - 100.0 fL   MCH 27.7 26.0 - 34.0 pg   MCHC 31.1 30.0 - 36.0 g/dL   RDW 91.4 (H) 78.2 - 95.6 %   Platelets 407 (H) 150 - 400 K/uL   Neutrophils Relative % 90 (H) 43 - 77 %   Lymphocytes Relative 6 (L) 12 - 46 %   Monocytes Relative 4 3 - 12 %   Eosinophils Relative 0 0 - 5 %   Basophils Relative 0 0 - 1 %   Neutro Abs 27.0 (H) 1.7 - 7.7 K/uL   Lymphs Abs 1.8 0.7 - 4.0 K/uL   Monocytes Absolute 1.2 (H) 0.1 - 1.0 K/uL   Eosinophils Absolute 0.0 0.0 - 0.7 K/uL   Basophils Absolute 0.0 0.0 - 0.1 K/uL   RBC Morphology CRENATED RBCs   Protime-INR     Status: Abnormal   Collection Time: 12/03/14  4:18 PM  Result Value Ref Range   Prothrombin Time 17.3 (H) 11.6 - 15.2 seconds   INR 1.40 0.00 - 1.49  Lactic acid, plasma  Status: Abnormal   Collection Time: 12/03/14  4:18 PM  Result Value Ref Range   Lactic Acid, Venous 7.5 (H) 0.5 - 2.2 mmol/L  Urinalysis, Routine w reflex microscopic     Status: Abnormal   Collection Time: 12/03/14  4:34 PM  Result Value Ref Range   Color, Urine YELLOW YELLOW   APPearance CLEAR CLEAR   Specific Gravity, Urine 1.024 1.005 - 1.030   pH 5.0 5.0 - 8.0   Glucose, UA >1000 (A) NEGATIVE mg/dL   Hgb urine dipstick NEGATIVE NEGATIVE   Bilirubin Urine NEGATIVE NEGATIVE   Ketones, ur 40 (A) NEGATIVE mg/dL   Protein, ur NEGATIVE NEGATIVE mg/dL   Urobilinogen, UA 0.2 0.0 - 1.0 mg/dL   Nitrite NEGATIVE NEGATIVE   Leukocytes, UA NEGATIVE NEGATIVE  Urine microscopic-add on     Status: None   Collection Time: 12/03/14  4:34 PM  Result Value Ref Range   Urine-Other      NO FORMED ELEMENTS SEEN ON URINE MICROSCOPIC EXAMINATION  CBG monitoring, ED     Status: Abnormal   Collection Time: 12/03/14  5:51 PM  Result Value Ref Range    Glucose-Capillary >600 (HH) 70 - 99 mg/dL  Comprehensive metabolic panel     Status: Abnormal   Collection Time: 12/03/14  6:14 PM  Result Value Ref Range   Sodium 128 (L) 135 - 145 mmol/L   Potassium 6.5 (HH) 3.5 - 5.1 mmol/L   Chloride 91 (L) 96 - 112 mEq/L   CO2 8 (LL) 19 - 32 mmol/L   Glucose, Bld 1047 (HH) 70 - 99 mg/dL   BUN 41 (H) 6 - 23 mg/dL   Creatinine, Ser 1.61 (H) 0.50 - 1.10 mg/dL   Calcium 8.7 8.4 - 09.6 mg/dL   Total Protein 6.7 6.0 - 8.3 g/dL   Albumin 3.9 3.5 - 5.2 g/dL   AST 26 0 - 37 U/L   ALT 16 0 - 35 U/L   Alkaline Phosphatase 99 39 - 117 U/L   Total Bilirubin 1.4 (H) 0.3 - 1.2 mg/dL   GFR calc non Af Amer 26 (L) >90 mL/min   GFR calc Af Amer 31 (L) >90 mL/min   Anion gap 29 (H) 5 - 15  Troponin I     Status: Abnormal   Collection Time: 12/03/14  6:14 PM  Result Value Ref Range   Troponin I 0.05 (H) <0.031 ng/mL  CBG monitoring, ED     Status: Abnormal   Collection Time: 12/03/14  8:24 PM  Result Value Ref Range   Glucose-Capillary >600 (HH) 70 - 99 mg/dL   Comment 1 Notify RN     UA no UTI  Lab Results  Component Value Date   HGBA1C 10.8* 10/21/2014    Estimated Creatinine Clearance: 30.7 mL/min (by C-G formula based on Cr of 2.06).  BNP (last 3 results)  Recent Labs  09/30/14 1431  PROBNP 794.3*    Other results:  I have pearsonaly reviewed this: ECG REPORT  Rate: 120  Rhythm: ST ST&T Change: no ischemic changes   There were no vitals filed for this visit.   Cultures:    Component Value Date/Time   SDES URINE, CATHETERIZED 09/30/2014 1749   SPECREQUEST NONE 09/30/2014 1749   CULT NO GROWTH Performed at [facility]  09/30/2014 1749   REPTSTATUS 10/01/2014 FINAL 09/30/2014 1749     Radiological Exams on Admission: Dg Chest 1 View  12/03/2014   CLINICAL DATA:  Altered mental status.  EXAM: CHEST - 1 VIEW  COMPARISON:  10/02/2014.  FINDINGS: Interval mildly enlarged cardiac silhouette. Clear lungs with normal  vascularity. Stable median sternotomy wires and prosthetic aortic valve. Mild scoliosis.  IMPRESSION: Interval mild cardiomegaly.   Electronically Signed   By: Gordan Payment M.D.   On: 12/03/2014 18:52   Ct Head Wo Contrast  12/03/2014   CLINICAL DATA:  Two day history of vomiting and slurred speech. Acute hyperglycemia, with blood sugars greater than 600. Prior history of stroke.  EXAM: CT HEAD WITHOUT CONTRAST  TECHNIQUE: Contiguous axial images were obtained from the base of the skull through the vertex without intravenous contrast.  COMPARISON:  09/30/2014, 03/08/2014.  FINDINGS: Encephalomalacia involving the right frontal, temporal and parietal lobes, unchanged. Encephalomalacia involving the occipital lobes bilaterally, left greater than right, unchanged. Focal encephalomalacia involving the left posterior frontal lobe, unchanged Ventricular system normal in size and appearance for age. Severe changes of small vessel disease of the white matter, unchanged. No mass lesion. No midline shift. No acute hemorrhage or hematoma. No extra-axial fluid collections. No evidence of acute infarction.  No skull fracture or other focal osseous abnormality involving the skull. Visualized paranasal sinuses, bilateral mastoid air cells and bilateral middle ear cavities well-aerated. Extensive bilateral carotid siphon atherosclerosis.  IMPRESSION: 1. No acute intracranial abnormality. 2. Multiple cortical strokes including a large right MCA distribution stroke, bilateral PCA distribution strokes, and a focal left MCA distribution stroke.   Electronically Signed   By: Hulan Saas M.D.   On: 12/03/2014 16:59    Chart has been reviewed  Assessment/Plan  54 yo F with hx of DM 1, AVR on chronic coumadin due to repeated CVA currently subtheraputic. Presents with severe DKA and severe sepsis with end organ damage including elevated troponin and ARF   Present on Admission:  . Diabetic ketoacidosis without coma  associated with type 1 diabetes mellitus - severe, admit to ICU will admit per DKA protocol, obtain serial BMET, continue glucosestabalizer, aggressive IVF. Change IVF to D5 1/2Na after BG <250 . Will work up cause of DKA most likely sepsis CXR - negative,  ECG - non ischemic cardiac enzymes first trop 0.05 continue to trend,  UA no UTI Replace potassium as needed.last K 6.5  . Severe sepsis with acute organ dysfunction - aggressive IVF, IV zosyn and vanc, blood and urine culture abd soft. She is on chronic methotrexate for hx of RA will hold, CCM is consulting . CAD (coronary artery disease) - cont to cycle CE . HTN (hypertension) - hold BP meds . Rheumatoid arthritis - hold methotrexate . 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 Lactic acidosis - repeat LA,  Aortic valve replacement - with mechanical valve with multiple leaks and strands on coumadin with recurrent CVA due to being subtheraputic.  Elevated troponin - will continue to cycle, per Dr. Eden Emms note no CAD per cath prior to AVR, repeat echo in am ARF in the setting of severe sepsis and DKA will obtain serial CR and check urine electrolytes Prophylaxis:   Lovenox full dose, Protonix  CODE STATUS:  FULL CODE    Other plan as per orders.  I have spent a total of 90 min on this admission of which spent 65 min at bedside due to acuity of the patient  Bevin Das 12/03/2014, 10:03 PM  [facility]  Pager [phone]   after 2 AM please page floor coverage PA If 7AM-7PM, please contact the day team taking care of the patient  [email or web address]  Password [email or web address]

## 2014-12-03 NOTE — Progress Notes (Addendum)
ANTICOAGULATION CONSULT NOTE - Initial Consult  Pharmacy Consult for Warfarin/Lovenox Indication: atrial fibrillation  Allergies  Allergen Reactions  . Celebrex [Celecoxib] Rash  . Detrol [Tolterodine] Hives    Patient Measurements:   Wt=75 kg  Vital Signs: Temp: 97.3 F (36.3 C) (01/15 2003) Temp Source: Oral (01/15 2003) BP: 99/47 mmHg (01/15 2200) Pulse Rate: 50 (01/15 2140)  Labs:  Recent Labs  12/03/14 1618 12/03/14 1814  HGB 10.9*  --   HCT 35.1*  --   PLT 407*  --   LABPROT 17.3*  --   INR 1.40  --   CREATININE  --  2.06*  TROPONINI  --  0.05*    Estimated Creatinine Clearance: 30.7 mL/min (by C-G formula based on Cr of 2.06).   Medical History: Past Medical History  Diagnosis Date  . CAD (coronary artery disease)   . Obesity   . Hypercholesteremia   . HTN (hypertension)   . Dyslipidemia   . Stroke syndrome   . Aortic stenosis   . Thrombophlebitis   . Diabetes   . Hyperlipidemia   . Rheumatoid arthritis(714.0)     Medications:  Scheduled:  . pantoprazole (PROTONIX) IV  40 mg Intravenous QHS  . [START ON 12/04/2014] vancomycin  1,000 mg Intravenous Q24H  . warfarin  10 mg Oral Once  . [START ON 12/04/2014] Warfarin - Pharmacist Dosing Inpatient   Does not apply q1800   Infusions:  . sodium chloride    . dextrose 5 % and 0.45% NaCl Stopped (12/03/14 2057)  . insulin (NOVOLIN-R) infusion 9.4 Units/hr (12/03/14 2100)    Assessment: 53 yoF on chronic warfarin for A-fb, hx stroke. HD= 5mg  daily except 7.5mg  on Wed.  INR= 1.4 on admission.   Goal of Therapy:  INR 2-3    Plan:   Warfarin 10mg  x1  tonight  Daily PT/INR  Education  Lovenox 75mg  SQ q12h confirmed with Dr Mon no CVA since 4/15  F/u SCr pt borderline for once daily dosing  12/03/2014,11:23 PM

## 2014-12-03 NOTE — Consult Note (Addendum)
PULMONARY / CRITICAL CARE MEDICINE   Name: Madison [name] MRN: [id number] DOB: 09-28-1961    ADMISSION DATE:  12/03/2014 CONSULTATION DATE:  12/03/14  REFERRING MD :  Dr. Adela Glimpse  CHIEF COMPLAINT:  Altered mental status  INITIAL PRESENTATION: Severe DKA, AKI, high suspicion of sepsis, labile BP. CXR no acute opacity but clinically suspicious for PNA.  STUDIES:  CXR 12/03/14: no acute opacity  SIGNIFICANT EVENTS: Insulin drip started 12/03/14   HISTORY OF PRESENT ILLNESS:  This is a 54 y.o. female has a past medical history of CAD (coronary artery disease); Obesity; Hypercholesteremia; HTN (hypertension); Dyslipidemia; Stroke syndrome; Aortic stenosis; Thrombophlebitis; Diabetes; Hyperlipidemia; and Rheumatoid arthritis(714.0). Patient has hx of DM 1, used to be on insuline pump until 2014 when she had a stroke and since then was not able to use. Has hx of bicuspid AV sp Replacement with echo showing multiple leaks and strands. On coumadin but continues to have strokes, currently subtherapeutic INR.  Apparently patient has been feeling "lousy" since yesterday, has dyspnea, productive cough with some yellowish sputum, nausea, vomiting, diarrhea, abd pain, and some headache. No urinary symptoms. At home her blood sugar was very high and she was brought to ED by EMS. She at first was minimaly awake and then became more awake and conversant when she got admitted and started on insulin drip, she is very thirsty. Was found to have BG 1000, with very elevated AG, pH 7.15, K elevated.. Also has acute renal failure. Trop 0.05.ECG unchanged. Urine unremarkable, CXR also no acute opacity. Got about 5-6 liters fluid in the ED, BP still labile.  No known sick contact, no recent travel.  PAST MEDICAL HISTORY :   has a past medical history of CAD (coronary artery disease); Obesity; Hypercholesteremia; HTN (hypertension); Dyslipidemia; Stroke syndrome; Aortic stenosis; Thrombophlebitis; Diabetes;  Hyperlipidemia; and Rheumatoid arthritis(714.0).  has past surgical history that includes Cesarean section; Foot fracture surgery; Knee surgery; and Neuroplasty / transposition median nerve at carpal tunnel. Prior to Admission medications   Medication Sig Start Date End Date Taking? Authorizing Provider  aspirin 81 MG tablet Take 1 tablet (81 mg total) by mouth daily. 10/11/14  Yes Huey Bienenstock, MD  baclofen (LIORESAL) 10 MG tablet Take 1 tablet (10 mg total) by mouth 2 (two) times daily. 10/29/14  Yes Baker Pierini, FNP  escitalopram (LEXAPRO) 20 MG tablet Take 20 mg by mouth daily.   Yes Historical Provider, MD  Insulin Detemir (LEVEMIR FLEXTOUCH) 100 UNIT/ML Pen Inject 25 Units into the skin every morning. and pen needles 1/day,patient to increase Lantus 5 units per day for every reading FINGERSTICK more than 200 , until she reaches her home regimen dose of 50 units subcutaneous daily. 10/04/14  Yes Dawood Elgergawy, MD  insulin lispro (HUMALOG KWIKPEN) 100 UNIT/ML KiwkPen Inject 5 units with evening meal 10/22/14  Yes Romero Belling, MD  methotrexate (RHEUMATREX) 2.5 MG tablet Take 1 tablet (2.5 mg total) by mouth 2 (two) times a week. Caution:Chemotherapy. Protect from light. 10/12/14  Yes Baker Pierini, FNP  omeprazole (PRILOSEC) 40 MG capsule TAKE 1 CAPSULE (40 MG TOTAL) BY MOUTH DAILY. 11/05/14  Yes Nelwyn Salisbury, MD  ONE TOUCH ULTRA TEST test strip TEST 5 TIMES A DAY, AND LANCETS 2/DAY 250.03 Patient taking differently: TEST 5 TIMES A DAY  250.03 10/18/14  Yes Romero Belling, MD  pravastatin (PRAVACHOL) 20 MG tablet TAKE 1 TABLET EVERY DAY 06/01/14  Yes Baker Pierini, FNP  promethazine (PHENERGAN) 25 MG tablet Take 1 tablet (  25 mg total) by mouth every 4 (four) hours as needed for nausea or vomiting. 10/08/14  Yes Nelwyn Salisbury, MD  traMADol (ULTRAM) 50 MG tablet TAKE 1 OR 2 TABLETS BY MOUTH EVERY 8 HOURS AS NEEDED 08/04/14  Yes Baker Pierini, FNP  VESICARE 5 MG tablet TAKE  1/2 TABLET ONCE DAILY 10/22/14  Yes Baker Pierini, FNP  warfarin (COUMADIN) 7.5 MG tablet Take 5-7.5 mg by mouth daily. Take 5 mg Daily By Mouth on Mon, Tues, Thurs, Fri, Sat & Sun, Take 7.5 mg By Mouth on Wed.   Yes Historical Provider, MD  zolpidem (AMBIEN) 10 MG tablet Take 1 tablet by mouth at bedtime. 09/14/14  Yes Historical Provider, MD  glucose blood (EMBRACE BLOOD GLUCOSE TEST) test strip Use to check blood sugar twice daily Patient not taking: Reported on 12/03/2014 07/16/14   Romero Belling, MD  solifenacin (VESICARE) 5 MG tablet Take 1 tablet (5 mg total) by mouth daily. Patient not taking: Reported on 12/03/2014 06/04/14   Baker Pierini, FNP  warfarin (COUMADIN) 5 MG tablet Take as directed by anticoagulation clinic Patient not taking: Reported on 12/03/2014 11/25/14   Baker Pierini, FNP  warfarin (COUMADIN) 7.5 MG tablet Take 1 tablet (7.5 mg total) by mouth every Wednesday. As instructed by  coagulation clinic, warfarin 5 mg oral daily except Wednesday, on Wednesday 7.5 mg oral , continue to follow with the clinic on discharge Patient not taking: Reported on 12/03/2014 10/04/14   Huey Bienenstock, MD   Allergies  Allergen Reactions  . Celebrex [Celecoxib] Rash  . Detrol [Tolterodine] Hives    FAMILY HISTORY:  indicated that her mother is alive. She indicated that her father is deceased. She indicated that all of her five sisters are alive. She indicated that both of her brothers are alive.  SOCIAL HISTORY:  reports that she has never smoked. She has never used smokeless tobacco. She reports that she does not drink alcohol or use illicit drugs.  REVIEW OF SYSTEMS:  Difficult to obtain due to mild alteration of mental status, please see HPI  SUBJECTIVE:   VITAL SIGNS: Temp:  [97.3 F (36.3 C)-97.8 F (36.6 C)] 97.3 F (36.3 C) (01/15 2003) Pulse Rate:  [50-124] 50 (01/15 2140) Resp:  [16-35] 19 (01/15 2155) BP: (79-117)/(26-102) 99/47 mmHg (01/15 2200) SpO2:  [96  %-100 %] 100 % (01/15 2140) HEMODYNAMICS:   VENTILATOR SETTINGS:   INTAKE / OUTPUT:  Intake/Output Summary (Last 24 hours) at 12/03/14 2341 Last data filed at 12/03/14 2149  Gross per 24 hour  Intake 31159.4 ml  Output      0 ml  Net 31159.4 ml    PHYSICAL EXAMINATION: General:  Well developed, appears dry, in mild to moderate distress Neuro:  LUE 4/5, LLE 4/5, RUE 5/5, RLE 5/5, EOM full and equal, speech slightly slurry HEENT:  Atraumatic, oral mucus membrane slightly dry Cardiovascular:  RRR, systolic murmur Lungs:  Clear, no wheeze Abdomen:  Soft, nontender, no guarding Musculoskeletal:  No edema, no gross deformities Skin:  No acute rash, no sacral ulcer  LABS:  CBC  Recent Labs Lab 12/03/14 1618  WBC 30.0*  HGB 10.9*  HCT 35.1*  PLT 407*   Coag's  Recent Labs Lab 12/03/14 1618  INR 1.40   BMET  Recent Labs Lab 12/03/14 1814  NA 128*  K 6.5*  CL 91*  CO2 8*  BUN 41*  CREATININE 2.06*  GLUCOSE 1047*   Electrolytes  Recent Labs Lab 12/03/14  1814  CALCIUM 8.7   Sepsis Markers  Recent Labs Lab 12/03/14 1618  LATICACIDVEN 7.5*   ABG  Recent Labs Lab 12/03/14 1615  PHART 7.158*  PCO2ART 18.6*  PO2ART 120.0*   Liver Enzymes  Recent Labs Lab 12/03/14 1814  AST 26  ALT 16  ALKPHOS 99  BILITOT 1.4*  ALBUMIN 3.9   Cardiac Enzymes  Recent Labs Lab 12/03/14 1814  TROPONINI 0.05*   Glucose  Recent Labs Lab 12/03/14 1751 12/03/14 2024  GLUCAP >600* >600*    Imaging CXR 12/03/14: Interval mildly enlarged cardiac silhouette. Clear lungs with normal vascularity. Stable median sternotomy wires and prosthetic aortic valve. Mild scoliosis.   ASSESSMENT / PLAN:  PULMONARY OETT A: Compensated anion gap metabolic acidosis P:   No need for intubation, mental status better May drink water as long as mentation stays stable Question of early PNA that is not seen on CXR yet, will repeat CXR in AM. Blood culture already  drawn. Give broad spectrum abx, may need vasopressor in the future. Patient is immunosuppressed due to MTX for her RA, will stop this for now Elevate head of bed  CARDIOVASCULAR CVL 12/03/14 A: s/p mechanical aortic valve P:  Continue coumadin, daily INR. Bridge with either lovenox SQ or heparin gtt Echo ordered to make sure there is no vegetations on the mechanical valve  RENAL A:  AKI due to dehydration and DKA, high K P:   Aggressive fluid resuscitation, avoid nephrotoxic meds Trend electrolytes  GASTROINTESTINAL A:  Nausea/vomiting likely due to DKA P:   May drink water. Watch for recurrence of diarrhea  HEMATOLOGIC A:  High WBC likely DKA, possible sepsis P:  Trend. Stop MTX. F/u blood culture  INFECTIOUS A:  Possible early PNA that is not seen on CXR yet P:   BCx2 pending 12/03/14 UC  Sputum Abx: vancomycin and zosyn, start date 12/03/14, day   ENDOCRINE A:  DKA P:   Unclear why she has DKA since she's persistent that she was taking her insulin. Possibly from sepsis. Aggressive IVF with NS for now, suggest add D5 when BS < 250, suggest add K when K < 4  NEUROLOGIC A:  AMS from DKA P:   RASS goal: 0 Monitor   FAMILY  - Updates:   - Inter-disciplinary family meet or Palliative Care meeting due by:    Critical care time 35 minutes  TODAY'S SUMMARY: Admitted for DKA, possible septic shock in an immunosuppressed patient, on abx. AKI.   Wadie Lessen Pulmonary and Critical Care Medicine [facility] Pager: [phone]  12/03/2014, 11:41 PM

## 2014-12-03 NOTE — ED Notes (Signed)
Emesis for 2 days, slurred speech when talking to, sugar >600 on meter, pt states that she is not gfeeling well. Hard to understand when talking to poor historian,

## 2014-12-03 NOTE — ED Notes (Signed)
Critical labs: K+ 6.5 (slightly hemolyzed per lab-may need to recollect), CO2 8, and GLucose 1047/  Samuella Cota RN and [place] EDP notified

## 2014-12-03 NOTE — ED Notes (Signed)
Pt medication intervention hung immediately post DG. Pt appears more drowsy and falls asleep easily between questions but answers questions correctly with use of touch to awake between responses. MD Romeo Apple notified.

## 2014-12-03 NOTE — ED Notes (Signed)
Per EMS, pt from home.  Pt took insulin this morning.  Sister called she was having "diabetic emergency".  Hx of stroke.  Cbg by EMS - high.  Vitals:  104/68, hr 84, resp 16,

## 2014-12-03 NOTE — Progress Notes (Signed)
Attempted to do pt's nursing admission hx. Pt unable to answer any questions. Briscoe Burns BSN, RN-BC Admissions RN  12/03/2014 9:12 PM

## 2014-12-04 ENCOUNTER — Inpatient Hospital Stay (HOSPITAL_COMMUNITY): Payer: Commercial Managed Care - HMO

## 2014-12-04 DIAGNOSIS — Z7901 Long term (current) use of anticoagulants: Secondary | ICD-10-CM

## 2014-12-04 DIAGNOSIS — I059 Rheumatic mitral valve disease, unspecified: Secondary | ICD-10-CM

## 2014-12-04 DIAGNOSIS — E1011 Type 1 diabetes mellitus with ketoacidosis with coma: Secondary | ICD-10-CM

## 2014-12-04 DIAGNOSIS — Z954 Presence of other heart-valve replacement: Secondary | ICD-10-CM

## 2014-12-04 LAB — BASIC METABOLIC PANEL
ANION GAP: 8 (ref 5–15)
ANION GAP: 6 (ref 5–15)
Anion gap: 7 (ref 5–15)
Anion gap: 3 — ABNORMAL LOW (ref 5–15)
BUN: 31 mg/dL — AB (ref 6–23)
BUN: 27 mg/dL — ABNORMAL HIGH (ref 6–23)
BUN: 24 mg/dL — AB (ref 6–23)
BUN: 20 mg/dL (ref 6–23)
BUN: 18 mg/dL (ref 6–23)
CALCIUM: 7.1 mg/dL — AB (ref 8.4–10.5)
CALCIUM: 7.1 mg/dL — AB (ref 8.4–10.5)
CHLORIDE: 115 meq/L — AB (ref 96–112)
CHLORIDE: 118 meq/L — AB (ref 96–112)
CHLORIDE: 119 meq/L — AB (ref 96–112)
CO2: 14 mmol/L — AB (ref 19–32)
CO2: 15 mmol/L — ABNORMAL LOW (ref 19–32)
CO2: 16 mmol/L — ABNORMAL LOW (ref 19–32)
CO2: 16 mmol/L — ABNORMAL LOW (ref 19–32)
CO2: 17 mmol/L — AB (ref 19–32)
CREATININE: 1.38 mg/dL — AB (ref 0.50–1.10)
CREATININE: 1.06 mg/dL (ref 0.50–1.10)
CREATININE: 1.07 mg/dL (ref 0.50–1.10)
Calcium: 7.1 mg/dL — ABNORMAL LOW (ref 8.4–10.5)
Calcium: 7 mg/dL — ABNORMAL LOW (ref 8.4–10.5)
Calcium: 7.3 mg/dL — ABNORMAL LOW (ref 8.4–10.5)
Chloride: 113 mEq/L — ABNORMAL HIGH (ref 96–112)
Chloride: 121 mEq/L — ABNORMAL HIGH (ref 96–112)
Creatinine, Ser: 1.03 mg/dL (ref 0.50–1.10)
Creatinine, Ser: 0.98 mg/dL (ref 0.50–1.10)
GFR calc Af Amer: 50 mL/min — ABNORMAL LOW (ref >90)
GFR calc Af Amer: 68 mL/min — ABNORMAL LOW (ref >90)
GFR calc Af Amer: 67 mL/min — ABNORMAL LOW (ref >90)
GFR calc Af Amer: 71 mL/min — ABNORMAL LOW (ref >90)
GFR calc Af Amer: 75 mL/min — ABNORMAL LOW (ref >90)
GFR calc non Af Amer: 59 mL/min — ABNORMAL LOW (ref >90)
GFR calc non Af Amer: 61 mL/min — ABNORMAL LOW (ref >90)
GFR, EST NON AFRICAN AMERICAN: 43 mL/min — AB (ref >90)
GFR, EST NON AFRICAN AMERICAN: 58 mL/min — AB (ref >90)
GFR, EST NON AFRICAN AMERICAN: 65 mL/min — AB (ref >90)
GLUCOSE: 326 mg/dL — AB (ref 70–99)
GLUCOSE: 128 mg/dL — AB (ref 70–99)
GLUCOSE: 148 mg/dL — AB (ref 70–99)
Glucose, Bld: 132 mg/dL — ABNORMAL HIGH (ref 70–99)
Glucose, Bld: 142 mg/dL — ABNORMAL HIGH (ref 70–99)
POTASSIUM: 3.6 mmol/L (ref 3.5–5.1)
POTASSIUM: 3.7 mmol/L (ref 3.5–5.1)
POTASSIUM: 3.6 mmol/L (ref 3.5–5.1)
Potassium: 3.1 mmol/L — ABNORMAL LOW (ref 3.5–5.1)
Potassium: 4.1 mmol/L (ref 3.5–5.1)
SODIUM: 139 mmol/L (ref 135–145)
SODIUM: 136 mmol/L (ref 135–145)
Sodium: 137 mmol/L (ref 135–145)
Sodium: 135 mmol/L (ref 135–145)
Sodium: 141 mmol/L (ref 135–145)

## 2014-12-04 LAB — GLUCOSE, CAPILLARY
GLUCOSE-CAPILLARY: 482 mg/dL — AB (ref 70–99)
GLUCOSE-CAPILLARY: 374 mg/dL — AB (ref 70–99)
GLUCOSE-CAPILLARY: 350 mg/dL — AB (ref 70–99)
GLUCOSE-CAPILLARY: 127 mg/dL — AB (ref 70–99)
GLUCOSE-CAPILLARY: 95 mg/dL (ref 70–99)
GLUCOSE-CAPILLARY: 100 mg/dL — AB (ref 70–99)
GLUCOSE-CAPILLARY: 129 mg/dL — AB (ref 70–99)
GLUCOSE-CAPILLARY: 147 mg/dL — AB (ref 70–99)
Glucose-Capillary: 221 mg/dL — ABNORMAL HIGH (ref 70–99)
Glucose-Capillary: 268 mg/dL — ABNORMAL HIGH (ref 70–99)
Glucose-Capillary: 310 mg/dL — ABNORMAL HIGH (ref 70–99)
Glucose-Capillary: 539 mg/dL — ABNORMAL HIGH (ref 70–99)
Glucose-Capillary: 597 mg/dL (ref 70–99)
Glucose-Capillary: >600 mg/dL (ref 70–99)
Glucose-Capillary: 121 mg/dL — ABNORMAL HIGH (ref 70–99)
Glucose-Capillary: 92 mg/dL (ref 70–99)
Glucose-Capillary: 132 mg/dL — ABNORMAL HIGH (ref 70–99)
Glucose-Capillary: 162 mg/dL — ABNORMAL HIGH (ref 70–99)
Glucose-Capillary: 150 mg/dL — ABNORMAL HIGH (ref 70–99)
Glucose-Capillary: 162 mg/dL — ABNORMAL HIGH (ref 70–99)

## 2014-12-04 LAB — MAGNESIUM
MAGNESIUM: 1.5 mg/dL (ref 1.5–2.5)
Magnesium: 2 mg/dL (ref 1.5–2.5)

## 2014-12-04 LAB — PHOSPHORUS
Phosphorus: 1.2 mg/dL — ABNORMAL LOW (ref 2.3–4.6)
Phosphorus: 2.9 mg/dL (ref 2.3–4.6)

## 2014-12-04 LAB — URINALYSIS, ROUTINE W REFLEX MICROSCOPIC
Bilirubin Urine: NEGATIVE
Glucose, UA: >1000 mg/dL — AB
Ketones, ur: >80 mg/dL — AB
Leukocytes, UA: NEGATIVE
Nitrite: NEGATIVE
PH: 5 (ref 5.0–8.0)
Protein, ur: NEGATIVE mg/dL
Specific Gravity, Urine: 1.019 (ref 1.005–1.030)
Urobilinogen, UA: 0.2 mg/dL (ref 0.0–1.0)

## 2014-12-04 LAB — URINE MICROSCOPIC-ADD ON

## 2014-12-04 LAB — TROPONIN I
TROPONIN I: 14.38 ng/mL — AB (ref <0.031)
TROPONIN I: 10.71 ng/mL — AB (ref <0.031)
Troponin I: 5.63 ng/mL (ref <0.031)

## 2014-12-04 LAB — PROCALCITONIN: PROCALCITONIN: 5.24 ng/mL

## 2014-12-04 LAB — LACTIC ACID, PLASMA: Lactic Acid, Venous: 1.9 mmol/L (ref 0.5–2.2)

## 2014-12-04 LAB — MRSA PCR SCREENING: MRSA by PCR: NEGATIVE

## 2014-12-04 MED ORDER — CHLORHEXIDINE GLUCONATE 0.12 % MT SOLN
15.0000 mL | Freq: Two times a day (BID) | OROMUCOSAL | Status: DC
  Administered 2014-12-04 – 2014-12-15 (×22): 15 mL via OROMUCOSAL
  Filled 2014-12-04 (×26): qty 15

## 2014-12-04 MED ORDER — VANCOMYCIN HCL IN DEXTROSE 750-5 MG/150ML-% IV SOLN
750.0000 mg | Freq: Two times a day (BID) | INTRAVENOUS | Status: DC
  Administered 2014-12-04 – 2014-12-06 (×4): 750 mg via INTRAVENOUS
  Filled 2014-12-04 (×5): qty 150

## 2014-12-04 MED ORDER — POTASSIUM CHLORIDE 10 MEQ/100ML IV SOLN
10.0000 meq | INTRAVENOUS | Status: AC
  Administered 2014-12-04 (×4): 10 meq via INTRAVENOUS
  Filled 2014-12-04 (×4): qty 100

## 2014-12-04 MED ORDER — WARFARIN SODIUM 5 MG PO TABS
5.0000 mg | ORAL_TABLET | Freq: Once | ORAL | Status: AC
  Administered 2014-12-04: 5 mg via ORAL
  Filled 2014-12-04: qty 1

## 2014-12-04 MED ORDER — ASPIRIN 81 MG PO CHEW
81.0000 mg | CHEWABLE_TABLET | Freq: Every day | ORAL | Status: DC
  Administered 2014-12-04 – 2014-12-07 (×4): 81 mg via ORAL
  Filled 2014-12-04 (×5): qty 1

## 2014-12-04 MED ORDER — CETYLPYRIDINIUM CHLORIDE 0.05 % MT LIQD
7.0000 mL | Freq: Two times a day (BID) | OROMUCOSAL | Status: DC
  Administered 2014-12-04 – 2014-12-14 (×16): 7 mL via OROMUCOSAL

## 2014-12-04 MED ORDER — POTASSIUM CHLORIDE CRYS ER 20 MEQ PO TBCR: 40.0000 meq | EXTENDED_RELEASE_TABLET | Freq: Four times a day (QID) | ORAL | Status: DC

## 2014-12-04 MED ORDER — INSULIN ASPART 100 UNIT/ML ~~LOC~~ SOLN
0.0000 [IU] | SUBCUTANEOUS | Status: DC
  Administered 2014-12-04: 3 [IU] via SUBCUTANEOUS
  Administered 2014-12-04: 2 [IU] via SUBCUTANEOUS

## 2014-12-04 MED ORDER — INSULIN DETEMIR 100 UNIT/ML ~~LOC~~ SOLN
25.0000 [IU] | Freq: Every day | SUBCUTANEOUS | Status: DC
  Administered 2014-12-04 – 2014-12-07 (×4): 25 [IU] via SUBCUTANEOUS
  Filled 2014-12-04 (×4): qty 0.25

## 2014-12-04 MED ORDER — SODIUM CHLORIDE 0.9 % IV SOLN
INTRAVENOUS | Status: DC
  Administered 2014-12-04: 100 mL/h via INTRAVENOUS

## 2014-12-04 MED ORDER — MAGNESIUM SULFATE 2 GM/50ML IV SOLN
2.0000 g | Freq: Once | INTRAVENOUS | Status: AC
  Administered 2014-12-04: 2 g via INTRAVENOUS
  Filled 2014-12-04: qty 50

## 2014-12-04 MED ORDER — INSULIN REGULAR BOLUS VIA INFUSION
0.0000 [IU] | Freq: Three times a day (TID) | INTRAVENOUS | Status: DC
  Filled 2014-12-04: qty 10

## 2014-12-04 MED ORDER — ONDANSETRON HCL 4 MG/2ML IJ SOLN
4.0000 mg | Freq: Four times a day (QID) | INTRAMUSCULAR | Status: DC | PRN
  Administered 2014-12-04 – 2014-12-06 (×4): 4 mg via INTRAVENOUS
  Filled 2014-12-04 (×4): qty 2

## 2014-12-04 MED ORDER — GLUCERNA SHAKE PO LIQD
237.0000 mL | Freq: Two times a day (BID) | ORAL | Status: DC
  Administered 2014-12-04 – 2014-12-15 (×18): 237 mL via ORAL
  Filled 2014-12-04 (×17): qty 237

## 2014-12-04 MED ORDER — POTASSIUM PHOSPHATES 15 MMOLE/5ML IV SOLN
20.0000 mmol | Freq: Once | INTRAVENOUS | Status: AC
  Administered 2014-12-04: 20 mmol via INTRAVENOUS
  Filled 2014-12-04: qty 6.67

## 2014-12-04 NOTE — Progress Notes (Signed)
[facility] PROGRESS NOTE  Madison [name] FKC:[id number] DOB: Oct 20, 1961 DOA: 12/03/2014 PCP: Janell Quiet, FNP  Assessment/Plan: 1. Sepsis -Present on admission, evidenced by a white count of 30,000, pulse of 119, respiratory rate of 25, hypotension with blood pressure of 79/26.  -Workup thus far has not shown source of infection, blood cultures are pending at the time of this dictation. -She does have a history of aortic valve replacement and cardiology has been consulted. -A transthoracic echocardiogram has been ordered. -Continue supportive care, follow-up on cultures, continue current IV antimicrobial regimen  2.  Diabetic ketoacidosis -I suspect underlying infectious process precipitating DKA -Overnight she was started on IV insulin and aggressively hydrated with IV fluids. -Bicarbonate is trending up to 15 on a.m. lab work with anion gap at 6. Will continue to follow diabetic ketoacidosis protocol -Follow-up on serial BMPs, transition to basal insulin per protocol. -Continue empiric IV antimicrobial therapy  3.  Suspected demand ischemia -Patient presenting with hypotension, sepsis, critical illness. Her troponins peaked at 5.63 overnight -She is currently being fully anticoagulated with subcutaneous Lovenox for urinary valve replacements -Cardiology consulted, will likely need workup at some point -Pending transthoracic echocardiogram at this time  4.  Aortic valve replacement -Patient on chronic anticoagulation presented with a subtherapeutic INR of 1.4 -She is currently being covered with Lovenox 1 mg/kg subcutaneous twice a day -Pharmacy consult for warfarin management -Pending transthoracic echocardiogram, last echo was performed on 03/09/2014 that showed mechanical valve sitting well in the aortic position, no aortic insufficiency or paravalvular leak. Echo also revealed an ejection fraction of 60-65% with grade 1 diastolic dysfunction  5.   Hypokalemia -Potassium trending down to 3.1, receiving potassium replacement  6.  Acute kidney injury -Secondary to sepsis and hypovolemia. Creatinine initially 2.06, trending down to 1.06 on a.m. lab work after aggressive IV fluid resuscitation overnight  Code Status: Full code Family Communication:  Disposition Plan: Continue treating patient in the intensive care unit   Consultants:  Pulmonary critical care medicine  Cardiology    Antibiotics:  Vancomycin  Zosyn  HPI/Subjective: Patient is a 54 year old female with a past medical history of type 1 diabetes mellitus, history of aortic valve replacement, hypertension, dyslipidemia, history of CVA who was admitted to the medicine service on 12/03/2014. She presented to the emergency room with mental status changes, having initial blood pressure of 80/66. Initial workup revealed diabetic ketoacidosis having an anion gap of 29 with bicarbonate of 8 and a glucose of 1047. Her white count was 30,000. Patient likely to be septic although did not have an obvious source of infection. Urinalysis was negative for nitrates and leukocyte esterase, chest x-ray did not reveal obvious infiltrate. She was covered with empiric IV antimicrobial therapy with IV vancomycin and Zosyn. Pulmonary critical care medicine was consulted as she was admitted to the intensive care unit. Overnight her troponins increased to 5.63. On this mornings evaluation, she denied chest pain or shortness of breath. She remains on IV insulin and IV fluids. I spoke with cardiology regarding troponin elevation.   Objective: Filed Vitals:   12/04/14 0900  BP:   Pulse: 104  Temp: 100.8 F (38.2 C)  Resp: 19    Intake/Output Summary (Last 24 hours) at 12/04/14 1007 Last data filed at 12/04/14 0930  Gross per 24 hour  Intake 35871.35 ml  Output   1550 ml  Net 34321.35 ml   There were no vitals filed for this visit.  Exam:   General:  Patient is somnolent  however  arousable and can answer a few questions for me, ill-appearing  Cardiovascular: Tachycardic, regular rate rhythm normal S1-S2  Respiratory: Coarse respiratory sounds, positive bibasilar crackles, no wheezing  Abdomen: Soft nontender nondistended  Musculoskeletal: Patient having hematoma over right lower back region, no edema  Data Reviewed: Basic Metabolic Panel:  Recent Labs Lab 12/03/14 1814 12/04/14 0336 12/04/14 0515 12/04/14 0746  NA 128* 137  --  139  K 6.5* 3.6  --  3.1*  CL 91* 115*  --  118*  CO2 8* 14*  --  15*  GLUCOSE 1047* 326*  --  132*  BUN 41* 31*  --  27*  CREATININE 2.06* 1.38*  --  1.06  CALCIUM 8.7 7.1*  --  7.1*  MG  --   --  1.5  --   PHOS  --   --  1.2*  --    Liver Function Tests:  Recent Labs Lab 12/03/14 1814  AST 26  ALT 16  ALKPHOS 99  BILITOT 1.4*  PROT 6.7  ALBUMIN 3.9   No results for input(s): LIPASE, AMYLASE in the last 168 hours. No results for input(s): AMMONIA in the last 168 hours. CBC:  Recent Labs Lab 12/03/14 1618  WBC 30.0*  NEUTROABS 27.0*  HGB 10.9*  HCT 35.1*  MCV 89.1  PLT 407*   Cardiac Enzymes:  Recent Labs Lab 12/03/14 1814 12/04/14 0522  TROPONINI 0.05* 5.63*   BNP (last 3 results)  Recent Labs  09/30/14 1431  PROBNP 794.3*   CBG:  Recent Labs Lab 12/04/14 0211 12/04/14 0316 12/04/14 0410 12/04/14 0512 12/04/14 0615  GLUCAP 374* 350* 310* 268* 221*    Recent Results (from the past 240 hour(s))  MRSA PCR Screening     Status: None   Collection Time: 12/03/14 11:41 PM  Result Value Ref Range Status   MRSA by PCR NEGATIVE NEGATIVE Final    Comment:        The GeneXpert MRSA Assay (FDA approved for NASAL specimens only), is one component of a comprehensive MRSA colonization surveillance program. It is not intended to diagnose MRSA infection nor to guide or monitor treatment for MRSA infections.      Studies: Dg Chest 1 View  12/04/2014   CLINICAL DATA:  Diabetic  ketoacidosis.  EXAM: CHEST - 1 VIEW  COMPARISON:  12/03/2014  FINDINGS: Heart size is within normal limits. No evidence of pulmonary infiltrate or pleural effusion. Right jugular central venous catheter tip remains in the proximal to mid right atrium. Prior median sternotomy noted.  IMPRESSION: No acute findings.   Electronically Signed   By: Myles Rosenthal M.D.   On: 12/04/2014 07:58   Dg Chest 1 View  12/03/2014   CLINICAL DATA:  Altered mental status.  EXAM: CHEST - 1 VIEW  COMPARISON:  10/02/2014.  FINDINGS: Interval mildly enlarged cardiac silhouette. Clear lungs with normal vascularity. Stable median sternotomy wires and prosthetic aortic valve. Mild scoliosis.  IMPRESSION: Interval mild cardiomegaly.   Electronically Signed   By: Gordan Payment M.D.   On: 12/03/2014 18:52   Ct Head Wo Contrast  12/03/2014   CLINICAL DATA:  Two day history of vomiting and slurred speech. Acute hyperglycemia, with blood sugars greater than 600. Prior history of stroke.  EXAM: CT HEAD WITHOUT CONTRAST  TECHNIQUE: Contiguous axial images were obtained from the base of the skull through the vertex without intravenous contrast.  COMPARISON:  09/30/2014, 03/08/2014.  FINDINGS: Encephalomalacia involving the right frontal, temporal and  parietal lobes, unchanged. Encephalomalacia involving the occipital lobes bilaterally, left greater than right, unchanged. Focal encephalomalacia involving the left posterior frontal lobe, unchanged Ventricular system normal in size and appearance for age. Severe changes of small vessel disease of the white matter, unchanged. No mass lesion. No midline shift. No acute hemorrhage or hematoma. No extra-axial fluid collections. No evidence of acute infarction.  No skull fracture or other focal osseous abnormality involving the skull. Visualized paranasal sinuses, bilateral mastoid air cells and bilateral middle ear cavities well-aerated. Extensive bilateral carotid siphon atherosclerosis.  IMPRESSION: 1.  No acute intracranial abnormality. 2. Multiple cortical strokes including a large right MCA distribution stroke, bilateral PCA distribution strokes, and a focal left MCA distribution stroke.   Electronically Signed   By: Hulan Saas M.D.   On: 12/03/2014 16:59   Dg Chest Port 1 View  12/04/2014   CLINICAL DATA:  Central line placement.  Initial encounter.  EXAM: PORTABLE CHEST - 1 VIEW  COMPARISON:  Chest radiograph performed earlier today at 6:40 p.m.  FINDINGS: The patient's right IJ line is seen ending about the cavoatrial junction.  The lungs are well-aerated. Mild vascular congestion is noted, with mildly increased interstitial markings, raising concern for mild interstitial edema. There is no evidence of pleural effusion or pneumothorax.  The cardiomediastinal silhouette is borderline normal in size. The patient is status post median sternotomy. No acute osseous abnormalities are seen.  IMPRESSION: 1. Right IJ line noted ending about the cavoatrial junction. 2. Mild vascular congestion, with mildly increased interstitial markings, raising concern for mild interstitial edema.   Electronically Signed   By: Roanna Raider M.D.   On: 12/04/2014 02:37    Scheduled Meds: . antiseptic oral rinse  7 mL Mouth Rinse q12n4p  . aspirin  81 mg Oral Daily  . chlorhexidine  15 mL Mouth Rinse BID  . enoxaparin (LOVENOX) injection  1 mg/kg Subcutaneous Q12H  . insulin regular  0-10 Units Intravenous TID WC  . pantoprazole (PROTONIX) IV  40 mg Intravenous QHS  . piperacillin-tazobactam (ZOSYN)  IV  3.375 g Intravenous Q8H  . potassium chloride  40 mEq Oral Q6H  . vancomycin  1,000 mg Intravenous Q24H  . Warfarin - Pharmacist Dosing Inpatient   Does not apply q1800   Continuous Infusions: . sodium chloride 10 mL/hr (12/04/14 0748)  . dextrose 5 % and 0.45% NaCl 125 mL/hr (12/04/14 0739)  . insulin (NOVOLIN-R) infusion 4.7 Units/hr (12/04/14 0930)    Active Problems:   DKA (diabetic ketoacidoses)    Septic shock   CAD (coronary artery disease)   HTN (hypertension)   Rheumatoid arthritis   S/P AVR   Type 1 diabetes mellitus with neurological manifestations   Diabetic ketoacidosis without coma associated with type 1 diabetes mellitus   Spastic hemiplegia affecting nondominant side   DKA, type 1   Severe sepsis with acute organ dysfunction   Lactic acidosis    Time spent: 35 min    Jeralyn Bennett  [facility] Pager [phone]. If 7PM-7AM, please contact night-coverage at [email or web address], password [facility] 12/04/2014, 10:07 AM  LOS: 1 day

## 2014-12-04 NOTE — Progress Notes (Signed)
Troponin I 5.63 (HH)   CRITICAL VALUE ALERT  Critical value received:  Troponin I 5.63  Date of notification:  12/04/14  Time of notification:  0625  Critical value read back:Yes.    Nurse who received alert:  Mike Craze, RN  MD notified (1st page): Everett Graff   Time of first page:  0630  MD notified (2nd page):  Time of second page:  Responding MD:  Everett Graff  Time MD responded:  0630

## 2014-12-04 NOTE — Procedures (Signed)
Central Venous Catheter Insertion Procedure Note Madison [name] [id number] Dec 26, 1960  Procedure: Insertion of Central Venous Catheter Indications: Assessment of intravascular volume, Drug and/or fluid administration and Frequent blood sampling  Procedure Details Consent: Unable to obtain consent because of emergent medical necessity. Time Out: Verified patient identification, verified procedure, site/side was marked, verified correct patient position, special equipment/implants available, medications/allergies/relevent history reviewed, required imaging and test results available.  Performed  Maximum sterile technique was used including antiseptics, cap, gloves, gown, hand hygiene, mask and sheet. Skin prep: Chlorhexidine; local anesthetic administered A antimicrobial bonded/coated triple lumen catheter was placed in the right internal jugular vein using the Seldinger technique.  Evaluation Blood flow good Complications: No apparent complications Patient did tolerate procedure well. Chest X-ray ordered to verify placement.  CXR: pending.  Madison [name] 12/04/2014, 12:03 AM

## 2014-12-04 NOTE — Consult Note (Addendum)
Cardiology Consult Note  Admit date: 12/03/2014 Name: Madison [name] 54 y.o.  female DOB:  Oct 31, 1961 MRN:  [id number]  Today's date:  12/04/2014  Referring Physician:   Critical Care Medicine  Primary Physician:    Dr. Romero Belling  Reason for Consultation:    Abnormal troponin  IMPRESSIONS: 1.  Elevation of troponin likely due to demand ischemia in the setting of severe sepsis.  There was not a clinical MI and she has not evolved an ST elevation MI on her EKG.  Will await results of echo. 2.  Previous prosthetic aortic valve placed for severe aortic stenosis and 2012 3.  Multiple strokes following aortic valve replacement. 4.  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 5.  Prior history of mild coronary artery disease  RECOMMENDATION: 1.  Await results of echo. 2.  Continue treatment for DKA and resume anticoagulation 3.  May require a transesophageal echo to further assess the aortic valve, particularly in light of regurgitant jets and fiber strands noted on previous echoes. 4.  She did not have significant CAD 3 years ago but would continue to cycle troponins.  HISTORY: This 54 year old female has a history of a bicuspid aortic valve and hand and aortic valve replacement in 2012 at [facility].  At the time of catheterization showed a 35% right coronary artery stenosis but no significant CAD in the other vessels.  She had an admission for DKA in April of this year and has also had admission this time for diabetic ketoacidosis and present sepsis.  She is recovering from her DKA now but is had elevation of her troponin rising now to 14.  There have been no evolutionary EKG changes.  She has a prior history of a stroke and this evidently occurred following her aortic valve replacement.  This is left her with some dysarthria and aphasia at times.  As a result the history taking on her is somewhat difficult.  She does not have chest pain and is not  currently having shortness of breath.  Past Medical History  Diagnosis Date  . CAD (coronary artery disease)   . Obesity   . Hypercholesteremia   . HTN (hypertension)   . Dyslipidemia   . Stroke syndrome   . Aortic stenosis   . Thrombophlebitis   . Diabetes   . Hyperlipidemia   . Rheumatoid arthritis(714.0)       Past Surgical History  Procedure Laterality Date  . Cesarean section    . Foot fracture surgery    . Knee surgery    . Neuroplasty / transposition median nerve at carpal tunnel       Allergies:  is allergic to celebrex and detrol.   Medications: Prior to Admission medications   Medication Sig Start Date End Date Taking? Authorizing Provider  aspirin 81 MG tablet Take 1 tablet (81 mg total) by mouth daily. 10/11/14  Yes Huey Bienenstock, MD  baclofen (LIORESAL) 10 MG tablet Take 1 tablet (10 mg total) by mouth 2 (two) times daily. 10/29/14  Yes Baker Pierini, FNP  escitalopram (LEXAPRO) 20 MG tablet Take 20 mg by mouth daily.   Yes Historical Provider, MD  Insulin Detemir (LEVEMIR FLEXTOUCH) 100 UNIT/ML Pen Inject 25 Units into the skin every morning. and pen needles 1/day,patient to increase Lantus 5 units per day for every reading FINGERSTICK more than 200 , until she reaches her home regimen dose of 50 units subcutaneous daily. 10/04/14  Yes Huey Bienenstock, MD  insulin lispro (HUMALOG KWIKPEN) 100 UNIT/ML KiwkPen Inject 5 units with evening meal 10/22/14  Yes Romero Belling, MD  methotrexate (RHEUMATREX) 2.5 MG tablet Take 1 tablet (2.5 mg total) by mouth 2 (two) times a week. Caution:Chemotherapy. Protect from light. 10/12/14  Yes Baker Pierini, FNP  omeprazole (PRILOSEC) 40 MG capsule TAKE 1 CAPSULE (40 MG TOTAL) BY MOUTH DAILY. 11/05/14  Yes Nelwyn Salisbury, MD  ONE TOUCH ULTRA TEST test strip TEST 5 TIMES A DAY, AND LANCETS 2/DAY 250.03 Patient taking differently: TEST 5 TIMES A DAY  250.03 10/18/14  Yes Romero Belling, MD  pravastatin (PRAVACHOL) 20 MG  tablet TAKE 1 TABLET EVERY DAY 06/01/14  Yes Baker Pierini, FNP  promethazine (PHENERGAN) 25 MG tablet Take 1 tablet (25 mg total) by mouth every 4 (four) hours as needed for nausea or vomiting. 10/08/14  Yes Nelwyn Salisbury, MD  traMADol (ULTRAM) 50 MG tablet TAKE 1 OR 2 TABLETS BY MOUTH EVERY 8 HOURS AS NEEDED 08/04/14  Yes Baker Pierini, FNP  VESICARE 5 MG tablet TAKE 1/2 TABLET ONCE DAILY 10/22/14  Yes Baker Pierini, FNP  warfarin (COUMADIN) 7.5 MG tablet Take 5-7.5 mg by mouth daily. Take 5 mg Daily By Mouth on Mon, Tues, Thurs, Fri, Sat & Sun, Take 7.5 mg By Mouth on Wed.   Yes Historical Provider, MD  zolpidem (AMBIEN) 10 MG tablet Take 1 tablet by mouth at bedtime. 09/14/14  Yes Historical Provider, MD  glucose blood (EMBRACE BLOOD GLUCOSE TEST) test strip Use to check blood sugar twice daily Patient not taking: Reported on 12/03/2014 07/16/14   Romero Belling, MD  solifenacin (VESICARE) 5 MG tablet Take 1 tablet (5 mg total) by mouth daily. Patient not taking: Reported on 12/03/2014 06/04/14   Baker Pierini, FNP  warfarin (COUMADIN) 5 MG tablet Take as directed by anticoagulation clinic Patient not taking: Reported on 12/03/2014 11/25/14   Baker Pierini, FNP  warfarin (COUMADIN) 7.5 MG tablet Take 1 tablet (7.5 mg total) by mouth every Wednesday. As instructed by  coagulation clinic, warfarin 5 mg oral daily except Wednesday, on Wednesday 7.5 mg oral , continue to follow with the clinic on discharge Patient not taking: Reported on 12/03/2014 10/04/14   Huey Bienenstock, MD    Family History: Family Status  Relation Status Death Age  . Mother Alive   . Father Deceased   . Brother Alive   . Sister Alive   . Sister Alive   . Sister Alive   . Sister Alive   . Sister Alive   . Brother Alive     Social History:   reports that she has never smoked. She has never used smokeless tobacco. She reports that she does not drink alcohol or use illicit drugs.   History   Social  History Narrative    Review of Systems: Other than as noted above remainder of the review of systems is unremarkable.  Physical Exam: BP 91/37 mmHg  Pulse 95  Temp(Src) 100.4 F (38 C) (Core (Comment))  Resp 14  Ht 5\' 3"  (1.6 m)  Wt 79.5 kg (175 lb 4.3 oz)  BMI 31.05 kg/m2  SpO2 99%  General appearance: Mildly obese dysarthric female in no acute distress, able to answer questions Head: Normocephalic, without obvious abnormality, atraumatic Neck: no adenopathy, no carotid bruit, no JVD and supple, symmetrical, trachea midline Lungs: Reduced breath sounds bilaterally Heart: Irregular rate and rhythm, normal S1, S2, 2/6 systolic murmur at aortic area, no  diastolic murmur Abdomen: soft, non-tender; bowel sounds normal; no masses,  no organomegaly Pelvic: deferred Extremities: No edema Pulses: 2+ and symmetric femorals, 1+ pedal Skin: Skin color, texture, turgor normal. No rashes or lesions Neurologic:dysarthic and mild aphasia, gait not tested   Labs: CBC  Recent Labs  12/03/14 1618  WBC 30.0*  RBC 3.94  HGB 10.9*  HCT 35.1*  PLT 407*  MCV 89.1  MCH 27.7  MCHC 31.1  RDW 16.6*  LYMPHSABS 1.8  MONOABS 1.2*  EOSABS 0.0  BASOSABS 0.0   CMP   Recent Labs  12/03/14 1814  12/04/14 1220  NA 128*  < > 135  K 6.5*  < > 3.7  CL 91*  < > 119*  CO2 8*  < > 16*  GLUCOSE 1047*  < > 128*  BUN 41*  < > 24*  CREATININE 2.06*  < > 1.07  CALCIUM 8.7  < > 7.0*  PROT 6.7  --   --   ALBUMIN 3.9  --   --   AST 26  --   --   ALT 16  --   --   ALKPHOS 99  --   --   BILITOT 1.4*  --   --   GFRNONAA 26*  < > 58*  GFRAA 31*  < > 67*  < > = values in this interval not displayed. BNP (last 3 results)  Recent Labs  09/30/14 1431  PROBNP 794.3*   Cardiac Panel (last 3 results)  Recent Labs  12/03/14 1814 12/04/14 0522 12/04/14 1220  TROPONINI 0.05* 5.63* 14.38*     Radiology: Normal chest x-ray, no infiltrate  EKG: rightward axis, poor R-wave progression, no  ischemic changes.  Signed:  Darden Palmer MD [facility]   Cardiology Consultant  12/04/2014, 2:26 PM

## 2014-12-04 NOTE — Progress Notes (Signed)
CRITICAL VALUE ALERT  Critical value received:  Troponin 14.38  Date of notification:  12/04/2014  Time of notification:  1308  Critical value read back:  yes  Nurse who received alert:  Roney Jaffe, RN  MD notified (1st page):  Dr. Clayton Lefort  Time of first page:  1310  MD notified (2nd page):  Time of second page:  Responding MD:  Dr. Craige Cotta  Time MD responded:  1311

## 2014-12-04 NOTE — Progress Notes (Signed)
ANTICOAGULATION CONSULT NOTE   Pharmacy Consult for warfarin/Lovenox Indication: mechanical AVR, atrial fibrillation, hx recurrent CVA  Allergies  Allergen Reactions  . Celebrex [Celecoxib] Rash  . Detrol [Tolterodine] Hives    Patient Measurements: Wt 75.3 kg Ht 5'3"  Vital Signs: Temp: 100.8 F (38.2 C) (01/16 1000) Temp Source: Core (Comment) (01/16 1000) BP: 91/37 mmHg (01/16 0806) Pulse Rate: 103 (01/16 1000)  Labs:  Recent Labs  12/03/14 1618 12/03/14 1814 12/04/14 0336 12/04/14 0522 12/04/14 0746  HGB 10.9*  --   --   --   --   HCT 35.1*  --   --   --   --   PLT 407*  --   --   --   --   LABPROT 17.3*  --   --   --   --   INR 1.40  --   --   --   --   CREATININE  --  2.06* 1.38*  --  1.06  TROPONINI  --  0.05*  --  5.63*  --     Estimated Creatinine Clearance: 59.7 mL/min (by C-G formula based on Cr of 1.06).   Medical History: Past Medical History  Diagnosis Date  . CAD (coronary artery disease)   . Obesity   . Hypercholesteremia   . HTN (hypertension)   . Dyslipidemia   . Stroke syndrome   . Aortic stenosis   . Thrombophlebitis   . Diabetes   . Hyperlipidemia   . Rheumatoid arthritis(714.0)     Medications:  Scheduled:  . antiseptic oral rinse  7 mL Mouth Rinse q12n4p  . aspirin  81 mg Oral Daily  . chlorhexidine  15 mL Mouth Rinse BID  . enoxaparin (LOVENOX) injection  1 mg/kg Subcutaneous Q12H  . insulin regular  0-10 Units Intravenous TID WC  . magnesium sulfate 1 - 4 g bolus IVPB  2 g Intravenous Once  . pantoprazole (PROTONIX) IV  40 mg Intravenous QHS  . piperacillin-tazobactam (ZOSYN)  IV  3.375 g Intravenous Q8H  . potassium chloride  10 mEq Intravenous Q1 Hr x 4  . potassium phosphate IVPB (mmol)  20 mmol Intravenous Once  . vancomycin  750 mg Intravenous Q12H  . Warfarin - Pharmacist Dosing Inpatient   Does not apply q1800   Infusions:  . sodium chloride 10 mL/hr (12/04/14 0748)  . dextrose 5 % and 0.45% NaCl 125 mL/hr  (12/04/14 0739)  . insulin (NOVOLIN-R) infusion 2.1 Units/hr (12/04/14 1030)    Assessment: 53 yoF on chronic warfarin for mechanical AVR and atrial fibrillation, has hx recurrent CVA while INR subtherapeutic, admitted 12/03/14 with DKA and sepsis. Per medication history and anticoagulation clinic records, patient's warfarin dosage prior to admission was 5 mg daily except 7.5 mg on Wednesdays. INR in clinic was 1.6 on 11/25/14 INR on admission was 1.4 Orders received for pharmacy to dose warfarin during inpatient stay and to add full-dose Lovenox while INR subtherapeutic.  Goal of Therapy:  INR 2.5 - 3.5 Monitor platelets by anticoagulation protocol: Yes   Today, 12/04/14: Patient received warfarin 10mg  x 1 as a booster dose at 0038 Now on Lovenox 75 mg (1mg /kg) SQ q12h Azotemia resolved, CrCl > 30 mL/min, so q12h Lovenox dosing appropriate Diet: clear liquid Drug-drug interactions:  ASA 81 mg daily (was taking prior to admission) - hx CAD and recurrent CVA No reports of bleeding   Plan:  1. Warfarin 5 mg PO x 1 today at 1800 as per patient's PTA  regimen 2. PT/INR daily while inpatient starting 12/05/14 3. Continue Lovenox 75 mg SQ q12h while INR < 2.5 4. Follow H/H, pltc, clinical course, any reports of bleeding.  Elie Goody, PharmD, BCPS Pager: [phone] 12/04/2014  11:58 AM

## 2014-12-04 NOTE — Procedures (Signed)
Arterial Catheter Insertion Procedure Note Madison [name] [id number] 02-Nov-1961  Procedure: Insertion of Arterial Catheter  Indications: Blood pressure monitoring  Procedure Details Consent: Unable to obtain consent because of emergent medical necessity. Time Out: Verified patient identification, verified procedure, site/side was marked, verified correct patient position, special equipment/implants available, medications/allergies/relevent history reviewed, required imaging and test results available.  Performed  Maximum sterile technique was used including antiseptics, cap, gloves, gown, hand hygiene, mask and sheet. Skin prep: Chlorhexidine; local anesthetic administered 20 gauge catheter was inserted into right radial artery using the Seldinger technique.  Evaluation Blood flow good; BP tracing good. Complications: No apparent complications.   Carney Harder 12/04/2014

## 2014-12-04 NOTE — Progress Notes (Signed)
ANTIBIOTIC CONSULT NOTE   Pharmacy Consult for vancomycin  Indication: Sepsis  Allergies  Allergen Reactions  . Celebrex [Celecoxib] Rash  . Detrol [Tolterodine] Hives    Patient Measurements: Wt 75.3 kg (12/02/14) Ht 5'3"  Vital Signs: Temp: 100.8 F (38.2 C) (01/16 1000) Temp Source: Core (Comment) (01/16 1000) BP: 91/37 mmHg (01/16 0806) Pulse Rate: 103 (01/16 1000) Intake/Output from previous day: 01/15 0701 - 01/16 0700 In: 12197 [P.O.:1080; I.V.:1298; IV Piggyback:33200] Out: 1500 [Urine:1500] Intake/Output from this shift: Total I/O In: 530.5 [P.O.:100; I.V.:430.5] Out: 50 [Urine:50]  Labs:  Recent Labs  12/03/14 1618 12/03/14 1814 12/04/14 0336 12/04/14 0746  WBC 30.0*  --   --   --   HGB 10.9*  --   --   --   PLT 407*  --   --   --   CREATININE  --  2.06* 1.38* 1.06   Estimated Creatinine Clearance: 59.7 mL/min (by C-G formula based on Cr of 1.06). No results for input(s): VANCOTROUGH, VANCOPEAK, VANCORANDOM, GENTTROUGH, GENTPEAK, GENTRANDOM, TOBRATROUGH, TOBRAPEAK, TOBRARND, AMIKACINPEAK, AMIKACINTROU, AMIKACIN in the last 72 hours.   Medical History: Past Medical History  Diagnosis Date  . CAD (coronary artery disease)   . Obesity   . Hypercholesteremia   . HTN (hypertension)   . Dyslipidemia   . Stroke syndrome   . Aortic stenosis   . Thrombophlebitis   . Diabetes   . Hyperlipidemia   . Rheumatoid arthritis(714.0)    Microbiology: 1/15 blood x 2: NGtd 1/15 MRSA PCR screen: negative   Anti-infectives: 1/15 >>Zosyn  >> 1/15 >>vancomycin  >>    Assessment: 81 yoF admitted with severe sepsis, acute organ dysfunction, DKA.  Zosyn per MD and vancomycin per pharmacy ordered for sepsis.  Goal of Therapy:  Vancomycin trough level 15-20 mcg/ml  Appropriate antibiotic dosing for indication and renal function; eradication of infection.  Today, 12/04/14: D#2 Vancomycin 1250 mg IV x 1, then 1000 mg IV q24h D#2 Zosyn 3.375 grams IV q8h  (extended-infusion) Low-grade fever Significant leukocytosis Azotemia has resolved   Plan:  1. Increase vancomycin to 750 mg IV q12h (next dose 2pm) 2. Agree with current Zosyn regimen ordered by MD (Zosyn 3.375 grams IV q8h [extended-infusion]) 3. Follow renal function, culture results, clinical course. 4. Check vancomycin trough at steady-state if clinically indicated.  Elie Goody, PharmD, BCPS Pager: [phone] 12/04/2014  11:42 AM

## 2014-12-04 NOTE — Progress Notes (Signed)
  Echocardiogram 2D Echocardiogram has been performed.  Madison [name] 12/04/2014, 1:53 PM

## 2014-12-04 NOTE — Consult Note (Signed)
PULMONARY / CRITICAL CARE MEDICINE   Name: Madison [name] MRN: [id number] DOB: May 01, 1961    ADMISSION DATE:  12/03/2014 CONSULTATION DATE:  12/03/14  REFERRING MD :  Dr. Adela Glimpse  CHIEF COMPLAINT:  Altered mental status  INITIAL PRESENTATION:  54 yo female with DKA and possible sepsis.  STUDIES:  1/15 CT head >> encephalomalacia Rt frontal/temporal/parietal lobes, b/l occipital lobes, Lt frontal lobe 1/16 Echo >>  SIGNIFICANT EVENTS: 1/15 Admit  SUBJECTIVE:  Breathing better.  Denies chest/abd pain.  VITAL SIGNS: Temp:  [97.3 F (36.3 C)-100.8 F (38.2 C)] 100.8 F (38.2 C) (01/16 0700) Pulse Rate:  [50-130] 103 (01/16 0700) Resp:  [15-35] 15 (01/16 0700) BP: (77-118)/(26-102) 101/33 mmHg (01/16 0700) SpO2:  [96 %-100 %] 97 % (01/16 0700) Arterial Line BP: (101-126)/(33-40) 101/36 mmHg (01/16 0700) INTAKE / OUTPUT:  Intake/Output Summary (Last 24 hours) at 12/04/14 0839 Last data filed at 12/04/14 0800  Gross per 24 hour  Intake 35588.45 ml  Output   1550 ml  Net 71696.45 ml    PHYSICAL EXAMINATION: General: mild increased WOB Neuro: sleepy, follows commands, moves all extremities HEENT: no sinus tenderness Cardiovascular: regular, 2/6 SM  Lungs: no wheeze Abdomen:  Soft, nontender, no guarding Musculoskeletal:  No edema, no gross deformities Skin:  No rashes  LABS:  CBC  Recent Labs Lab 12/03/14 1618  WBC 30.0*  HGB 10.9*  HCT 35.1*  PLT 407*   Coag's  Recent Labs Lab 12/03/14 1618  INR 1.40   BMET  Recent Labs Lab 12/03/14 1814 12/04/14 0336  NA 128* 137  K 6.5* 3.6  CL 91* 115*  CO2 8* 14*  BUN 41* 31*  CREATININE 2.06* 1.38*  GLUCOSE 1047* 326*   Electrolytes  Recent Labs Lab 12/03/14 1814 12/04/14 0336  CALCIUM 8.7 7.1*   Sepsis Markers  Recent Labs Lab 12/03/14 1618 12/04/14 0250 12/04/14 0410  LATICACIDVEN 7.5* 1.9  --   PROCALCITON  --   --  5.24   ABG  Recent Labs Lab 12/03/14 1615  PHART 7.158*   PCO2ART 18.6*  PO2ART 120.0*   Liver Enzymes  Recent Labs Lab 12/03/14 1814  AST 26  ALT 16  ALKPHOS 99  BILITOT 1.4*  ALBUMIN 3.9   Cardiac Enzymes  Recent Labs Lab 12/03/14 1814 12/04/14 0522  TROPONINI 0.05* 5.63*   Glucose  Recent Labs Lab 12/03/14 1751 12/03/14 2024 12/03/14 2246 12/03/14 2255  GLUCAP >600* >600* >600* 597*    Imaging Dg Chest 1 View  12/04/2014   CLINICAL DATA:  Diabetic ketoacidosis.  EXAM: CHEST - 1 VIEW  COMPARISON:  12/03/2014  FINDINGS: Heart size is within normal limits. No evidence of pulmonary infiltrate or pleural effusion. Right jugular central venous catheter tip remains in the proximal to mid right atrium. Prior median sternotomy noted.  IMPRESSION: No acute findings.   Electronically Signed   By: Myles Rosenthal M.D.   On: 12/04/2014 07:58   Dg Chest 1 View  12/03/2014   CLINICAL DATA:  Altered mental status.  EXAM: CHEST - 1 VIEW  COMPARISON:  10/02/2014.  FINDINGS: Interval mildly enlarged cardiac silhouette. Clear lungs with normal vascularity. Stable median sternotomy wires and prosthetic aortic valve. Mild scoliosis.  IMPRESSION: Interval mild cardiomegaly.   Electronically Signed   By: Gordan Payment M.D.   On: 12/03/2014 18:52   Ct Head Wo Contrast  12/03/2014   CLINICAL DATA:  Two day history of vomiting and slurred speech. Acute hyperglycemia, with blood sugars greater than 600.  Prior history of stroke.  EXAM: CT HEAD WITHOUT CONTRAST  TECHNIQUE: Contiguous axial images were obtained from the base of the skull through the vertex without intravenous contrast.  COMPARISON:  09/30/2014, 03/08/2014.  FINDINGS: Encephalomalacia involving the right frontal, temporal and parietal lobes, unchanged. Encephalomalacia involving the occipital lobes bilaterally, left greater than right, unchanged. Focal encephalomalacia involving the left posterior frontal lobe, unchanged Ventricular system normal in size and appearance for age. Severe changes of  small vessel disease of the white matter, unchanged. No mass lesion. No midline shift. No acute hemorrhage or hematoma. No extra-axial fluid collections. No evidence of acute infarction.  No skull fracture or other focal osseous abnormality involving the skull. Visualized paranasal sinuses, bilateral mastoid air cells and bilateral middle ear cavities well-aerated. Extensive bilateral carotid siphon atherosclerosis.  IMPRESSION: 1. No acute intracranial abnormality. 2. Multiple cortical strokes including a large right MCA distribution stroke, bilateral PCA distribution strokes, and a focal left MCA distribution stroke.   Electronically Signed   By: Hulan Saas M.D.   On: 12/03/2014 16:59   Dg Chest Port 1 View  12/04/2014   CLINICAL DATA:  Central line placement.  Initial encounter.  EXAM: PORTABLE CHEST - 1 VIEW  COMPARISON:  Chest radiograph performed earlier today at 6:40 p.m.  FINDINGS: The patient's right IJ line is seen ending about the cavoatrial junction.  The lungs are well-aerated. Mild vascular congestion is noted, with mildly increased interstitial markings, raising concern for mild interstitial edema. There is no evidence of pleural effusion or pneumothorax.  The cardiomediastinal silhouette is borderline normal in size. The patient is status post median sternotomy. No acute osseous abnormalities are seen.  IMPRESSION: 1. Right IJ line noted ending about the cavoatrial junction. 2. Mild vascular congestion, with mildly increased interstitial markings, raising concern for mild interstitial edema.   Electronically Signed   By: Roanna Raider M.D.   On: 12/04/2014 02:37     ASSESSMENT / PLAN:  PULMONARY A:  Acute respiratory distress 2nd to DKA. P:   Oxygen as needed to keep SpO2 > 92% Bronchial hygiene F/u CXR   CARDIOVASCULAR Rt IJ CVL 1/15 >> Rt radial aline 1/15 >> A:  Hypotension 2nd to hypovolemia. Elevated troponin. Hx of CAD, HTN, HLD, AS s/p AVR. P:  Monitor  hemodynamics Coumadin per pharmacy >> bridge with lovenox Continue ASA F/u Echo Will need cardiology assessment at some point Hold pravachol for now  RENAL A:   AKI 2nd to volume depletion. P:   Monitor renal fx, urine outpt F/u electrolytes and replace as needed  GASTROINTESTINAL A:  N/V 2nd to DKA. P:   Clear liquid diet Advance diet when DKA corrected and mental status improved Protonix for SUP  HEMATOLOGIC A:   Leukocytosis. Anemia of critical illness and chronic disease. P:  F/u CBC  INFECTIOUS A:  ?sepsis >> no obvious source; procalcitonin 5.24 from 1/16. P:   Day 2 vancomycin, zosyn  Blood 1/15 >>  ENDOCRINE A:   DKA. P:   IV insulin until anion gap corrected  NEUROLOGIC A:  Acute encephalopathy 2nd to DKA. Hx of recurrent CVA's. P:   Monitor mental status Hold outpt lexapro, ambien, tramadol  RHEUMATOLOGY A: Hx of rheumatoid arthritis. P: Hold outpt MTX  CC time 40 minutes.  Coralyn Helling, MD [facility] Pulmonary/Critical Care 12/04/2014, 9:01 AM Pager:  [phone] After 3pm call: [phone]

## 2014-12-05 ENCOUNTER — Inpatient Hospital Stay (HOSPITAL_COMMUNITY): Payer: Commercial Managed Care - HMO

## 2014-12-05 DIAGNOSIS — R791 Abnormal coagulation profile: Secondary | ICD-10-CM

## 2014-12-05 DIAGNOSIS — IMO0001 Reserved for inherently not codable concepts without codable children: Secondary | ICD-10-CM | POA: Insufficient documentation

## 2014-12-05 DIAGNOSIS — E1040 Type 1 diabetes mellitus with diabetic neuropathy, unspecified: Secondary | ICD-10-CM

## 2014-12-05 LAB — BASIC METABOLIC PANEL
ANION GAP: 1 — AB (ref 5–15)
Anion gap: 3 — ABNORMAL LOW (ref 5–15)
BUN: 15 mg/dL (ref 6–23)
BUN: 17 mg/dL (ref 6–23)
CALCIUM: 7.4 mg/dL — AB (ref 8.4–10.5)
CHLORIDE: 120 meq/L — AB (ref 96–112)
CO2: 17 mmol/L — AB (ref 19–32)
CO2: 19 mmol/L (ref 19–32)
Calcium: 7.4 mg/dL — ABNORMAL LOW (ref 8.4–10.5)
Chloride: 119 mEq/L — ABNORMAL HIGH (ref 96–112)
Creatinine, Ser: 1 mg/dL (ref 0.50–1.10)
Creatinine, Ser: 1.04 mg/dL (ref 0.50–1.10)
GFR calc Af Amer: 73 mL/min — ABNORMAL LOW (ref >90)
GFR calc non Af Amer: 60 mL/min — ABNORMAL LOW (ref >90)
GFR, EST AFRICAN AMERICAN: 70 mL/min — AB (ref >90)
GFR, EST NON AFRICAN AMERICAN: 63 mL/min — AB (ref >90)
GLUCOSE: 112 mg/dL — AB (ref 70–99)
Glucose, Bld: 114 mg/dL — ABNORMAL HIGH (ref 70–99)
Potassium: 3.5 mmol/L (ref 3.5–5.1)
Potassium: 3.7 mmol/L (ref 3.5–5.1)
SODIUM: 140 mmol/L (ref 135–145)
Sodium: 139 mmol/L (ref 135–145)

## 2014-12-05 LAB — PROTIME-INR
INR: 6.23 (ref 0.00–1.49)
Prothrombin Time: 55.6 seconds — ABNORMAL HIGH (ref 11.6–15.2)

## 2014-12-05 LAB — HEPATIC FUNCTION PANEL
ALBUMIN: 2.6 g/dL — AB (ref 3.5–5.2)
ALT: 39 U/L — ABNORMAL HIGH (ref 0–35)
AST: 83 U/L — AB (ref 0–37)
Alkaline Phosphatase: 61 U/L (ref 39–117)
BILIRUBIN DIRECT: 0.1 mg/dL (ref 0.0–0.3)
BILIRUBIN TOTAL: 0.6 mg/dL (ref 0.3–1.2)
Indirect Bilirubin: 0.5 mg/dL (ref 0.3–0.9)
Total Protein: 4.8 g/dL — ABNORMAL LOW (ref 6.0–8.3)

## 2014-12-05 LAB — CBC
HCT: 26.4 % — ABNORMAL LOW (ref 36.0–46.0)
Hemoglobin: 8.8 g/dL — ABNORMAL LOW (ref 12.0–15.0)
MCH: 27.8 pg (ref 26.0–34.0)
MCHC: 33.3 g/dL (ref 30.0–36.0)
MCV: 83.3 fL (ref 78.0–100.0)
PLATELETS: 261 10*3/uL (ref 150–400)
RBC: 3.17 MIL/uL — AB (ref 3.87–5.11)
RDW: 17.4 % — ABNORMAL HIGH (ref 11.5–15.5)
WBC: 16.8 10*3/uL — ABNORMAL HIGH (ref 4.0–10.5)

## 2014-12-05 LAB — GLUCOSE, CAPILLARY
GLUCOSE-CAPILLARY: 95 mg/dL (ref 70–99)
GLUCOSE-CAPILLARY: 81 mg/dL (ref 70–99)
GLUCOSE-CAPILLARY: 160 mg/dL — AB (ref 70–99)
Glucose-Capillary: 103 mg/dL — ABNORMAL HIGH (ref 70–99)
Glucose-Capillary: 200 mg/dL — ABNORMAL HIGH (ref 70–99)

## 2014-12-05 LAB — TROPONIN I
TROPONIN I: 6.55 ng/mL — AB (ref <0.031)
Troponin I: 11.13 ng/mL (ref <0.031)
Troponin I: 15.04 ng/mL (ref <0.031)
Troponin I: 5.34 ng/mL (ref <0.031)

## 2014-12-05 LAB — PHOSPHORUS: Phosphorus: 2.6 mg/dL (ref 2.3–4.6)

## 2014-12-05 LAB — MAGNESIUM: MAGNESIUM: 2.2 mg/dL (ref 1.5–2.5)

## 2014-12-05 LAB — PROCALCITONIN: PROCALCITONIN: 3.65 ng/mL

## 2014-12-05 MED ORDER — POTASSIUM CHLORIDE CRYS ER 20 MEQ PO TBCR
40.0000 meq | EXTENDED_RELEASE_TABLET | Freq: Once | ORAL | Status: AC
  Administered 2014-12-05: 40 meq via ORAL
  Filled 2014-12-05: qty 2

## 2014-12-05 MED ORDER — PANTOPRAZOLE SODIUM 40 MG PO TBEC
40.0000 mg | DELAYED_RELEASE_TABLET | Freq: Every day | ORAL | Status: DC
  Administered 2014-12-05 – 2014-12-15 (×11): 40 mg via ORAL
  Filled 2014-12-05 (×10): qty 1

## 2014-12-05 MED ORDER — INSULIN ASPART 100 UNIT/ML ~~LOC~~ SOLN
0.0000 [IU] | Freq: Three times a day (TID) | SUBCUTANEOUS | Status: DC
  Administered 2014-12-05 (×2): 3 [IU] via SUBCUTANEOUS
  Administered 2014-12-06 (×3): 2 [IU] via SUBCUTANEOUS
  Administered 2014-12-07: 5 [IU] via SUBCUTANEOUS
  Administered 2014-12-07: 2 [IU] via SUBCUTANEOUS
  Administered 2014-12-08: 18:00:00 3 [IU] via SUBCUTANEOUS
  Administered 2014-12-09: 13:00:00 11 [IU] via SUBCUTANEOUS
  Administered 2014-12-09: 17:00:00 3 [IU] via SUBCUTANEOUS
  Administered 2014-12-10 (×2): 8 [IU] via SUBCUTANEOUS
  Administered 2014-12-11: 3 [IU] via SUBCUTANEOUS
  Administered 2014-12-11: 11 [IU] via SUBCUTANEOUS
  Administered 2014-12-11 – 2014-12-12 (×2): 3 [IU] via SUBCUTANEOUS
  Administered 2014-12-12 (×2): 8 [IU] via SUBCUTANEOUS

## 2014-12-05 MED ORDER — SODIUM CHLORIDE 0.9 % IV SOLN
INTRAVENOUS | Status: DC
  Administered 2014-12-05 – 2014-12-07 (×4): via INTRAVENOUS

## 2014-12-05 NOTE — Procedures (Signed)
Artline removed from pt's right radial and gauze held on site for 10 minutes.  No complications.

## 2014-12-05 NOTE — Progress Notes (Signed)
[facility] PROGRESS NOTE  Madison [name] EFE:[id number] DOB: Nov 23, 1960 DOA: 12/03/2014 PCP: Janell Quiet, FNP  Interim summary Patient is a 54 year old female with a past medical history of type 1 diabetes mellitus, history of aortic valve replacement, hypertension, dyslipidemia, history of CVA who was admitted to the medicine service on 12/03/2014. She presented to the emergency room with mental status changes, having initial blood pressure of 80/66. Initial workup revealed diabetic ketoacidosis having an anion gap of 29 with bicarbonate of 8 and a glucose of 1047. Her white count was 30,000. Patient likely to be septic although did not have an obvious source of infection. Urinalysis was negative for nitrates and leukocyte esterase, chest x-ray did not reveal obvious infiltrate. She was covered with empiric IV antimicrobial therapy with IV vancomycin and Zosyn. Pulmonary critical care medicine was consulted as she was admitted to the intensive care unit. Her troponins increased to 5.63 for which Cardiology was consulted. Troponin elevation felt to be secondary to demand ischemia. We'll need heart cath once sepsis and metabolic issues resolve. Regard to diabetic ketoacidosis she was transitioned to basal insulin on 12/04/2014 with the discontinuation of IV insulin. Patient tolerating by mouth intake.   Assessment/Plan: 1. Sepsis -Present on admission, evidenced by a white count of 30,000, pulse of 119, respiratory rate of 25, hypotension with blood pressure of 79/26.  -Workup thus far has not shown source of infection, blood cultures drawn 12/03/2014 showing no growth to date. -She does have a history of aortic valve replacement and cardiology has been consulted. -A transthoracic echocardiogram performed on 12/04/2014 showing ejection fraction of 45-50% with moderate hypokinesis of the mid apical anteroseptal myocardium. Vegetations not seen on 2-D echo.  -Continue supportive care,  follow-up on cultures, continue IV Zosyn and vancomycin  2.  Diabetic ketoacidosis -I suspect underlying infectious process precipitating DKA -DKA resolving by 12/04/2014 as her IV insulin was discontinued and transitioned to subcutaneous Levemir. -Blood sugars remain stable -Repeat blood work this morning showing resolution of diabetic ketoacidosis. -Continue Levemir 25 units subcutaneous daily with Accu-Cheks and sliding scale coverage  3.  Suspected demand ischemia -Patient presenting with hypotension, sepsis/critical illness. Her troponins peaked at 15.04 on 10/05/2015 -Cardiology consulted, will likely need further workup with heart cath once sepsis resolves -She had a transthoracic echocardiogram done on 12/04/2014 that showed an ejection fraction 45-50% with moderate hypokinesis -Patient remains asymptomatic as she denies chest pain or shortness of breath.  4.  Supratherapeutic INR -Patient anticoagulated for history of mechanical aortic valve replacement -A.m. labs on 12/05/2014 showing INR of 6.23 -Pharmacy consulted for warfarin management -Lovenox that was being used as a bridge has been discontinued.  5.  Aortic valve replacement -Patient on chronic anticoagulation presented with a subtherapeutic INR of 1.4 and had been covered with Lovenox 1 mg/kg subcutaneous twice a day -Pharmacy consult for warfarin management -Patient supratherapeutic have an INR of 6.23, Lovenox discontinued.  6.  Hypokalemia -Replaced  7.  Acute kidney injury -Secondary to sepsis and hypovolemia as she initially presented with a creatinine of 2.06 -Resolve with administration of IV fluids  Code Status: Full code Family Communication:  Disposition Plan: Continue treating patient in the intensive care unit   Consultants:  Pulmonary critical care medicine  Cardiology    Antibiotics:  Vancomycin  Zosyn  HPI/Subjective: Patient is awake, alert, states feeling little better today. She  is tolerating by mouth intake. She presently denies chest pain, shortness of breath, nausea, vomiting.  Objective: Filed Vitals:  12/05/14 0800  BP:   Pulse:   Temp: 98.5 F (36.9 C)  Resp:     Intake/Output Summary (Last 24 hours) at 12/05/14 1030 Last data filed at 12/05/14 0600  Gross per 24 hour  Intake 3835.11 ml  Output   4050 ml  Net -214.89 ml   Filed Weights   12/04/14 1351  Weight: 79.5 kg (175 lb 4.3 oz)    Exam:   General:  She appears improved on this mornings evaluation, awake, alert  Cardiovascular: Tachycardic, regular rate rhythm normal S1-S2  Respiratory: Lungs overall are clear to auscultation bilaterally, normal respiratory effort, no wheezing rhonchi or rales  Abdomen: Soft nontender nondistended  Musculoskeletal: Patient having hematoma over right lower back region, no edema  Data Reviewed: Basic Metabolic Panel:  Recent Labs Lab 12/04/14 0515  12/04/14 1220 12/04/14 1630 12/04/14 2120 12/04/14 2336 12/05/14 0312 12/05/14 0445  NA  --   < > 135 136 141 140 139  --   K  --   < > 3.7 4.1 3.6 3.7 3.5  --   CL  --   < > 119* 113* 121* 120* 119*  --   CO2  --   < > 16* 16* 17* 17* 19  --   GLUCOSE  --   < > 128* 148* 142* 114* 112*  --   BUN  --   < > 24* 20 18 17 15   --   CREATININE  --   < > 1.07 1.03 0.98 1.04 1.00  --   CALCIUM  --   < > 7.0* 7.1* 7.3* 7.4* 7.4*  --   MG 1.5  --   --  2.0  --   --   --  2.2  PHOS 1.2*  --   --  2.9  --   --   --  2.6  < > = values in this interval not displayed. Liver Function Tests:  Recent Labs Lab 12/03/14 1814 12/05/14 0445  AST 26 83*  ALT 16 39*  ALKPHOS 99 61  BILITOT 1.4* 0.6  PROT 6.7 4.8*  ALBUMIN 3.9 2.6*   No results for input(s): LIPASE, AMYLASE in the last 168 hours. No results for input(s): AMMONIA in the last 168 hours. CBC:  Recent Labs Lab 12/03/14 1618 12/05/14 0445  WBC 30.0* 16.8*  NEUTROABS 27.0*  --   HGB 10.9* 8.8*  HCT 35.1* 26.4*  MCV 89.1 83.3  PLT  407* 261   Cardiac Enzymes:  Recent Labs Lab 12/04/14 0522 12/04/14 1220 12/04/14 1800 12/04/14 2336 12/05/14 0445  TROPONINI 5.63* 14.38* 10.71* 15.04* 11.13*   BNP (last 3 results)  Recent Labs  09/30/14 1431  PROBNP 794.3*   CBG:  Recent Labs Lab 12/04/14 1627 12/04/14 1954 12/05/14 12/05/14 0444 12/05/14 0818  GLUCAP 147* 162* 103* 95 81    Recent Results (from the past 240 hour(s))  Culture, blood (routine x 2)     Status: None (Preliminary result)   Collection Time: 12/03/14  4:18 PM  Result Value Ref Range Status   Specimen Description BLOOD LEFT WRIST  Final   Special Requests BOTTLES DRAWN AEROBIC ONLY 12/05/14  Final   Culture   Final           BLOOD CULTURE RECEIVED NO GROWTH TO DATE CULTURE WILL BE HELD FOR 5 DAYS BEFORE ISSUING A FINAL NEGATIVE REPORT Performed at    Report Status PENDING  Incomplete  Culture, blood (routine x  2)     Status: None (Preliminary result)   Collection Time: 12/03/14  4:20 PM  Result Value Ref Range Status   Specimen Description BLOOD RIGHT WRIST  Final   Special Requests BOTTLES DRAWN AEROBIC AND ANAEROBIC 1.5ML  Final   Culture   Final           BLOOD CULTURE RECEIVED NO GROWTH TO DATE CULTURE WILL BE HELD FOR 5 DAYS BEFORE ISSUING A FINAL NEGATIVE REPORT Performed at [facility]    Report Status PENDING  Incomplete  MRSA PCR Screening     Status: None   Collection Time: 12/03/14 11:41 PM  Result Value Ref Range Status   MRSA by PCR NEGATIVE NEGATIVE Final    Comment:        The GeneXpert MRSA Assay (FDA approved for NASAL specimens only), is one component of a comprehensive MRSA colonization surveillance program. It is not intended to diagnose MRSA infection nor to guide or monitor treatment for MRSA infections.      Studies: Dg Chest 1 View  12/04/2014   CLINICAL DATA:  Diabetic ketoacidosis.  EXAM: CHEST - 1 VIEW  COMPARISON:  12/03/2014  FINDINGS: Heart size is within normal  limits. No evidence of pulmonary infiltrate or pleural effusion. Right jugular central venous catheter tip remains in the proximal to mid right atrium. Prior median sternotomy noted.  IMPRESSION: No acute findings.   Electronically Signed   By: Myles Rosenthal M.D.   On: 12/04/2014 07:58   Dg Chest 1 View  12/03/2014   CLINICAL DATA:  Altered mental status.  EXAM: CHEST - 1 VIEW  COMPARISON:  10/02/2014.  FINDINGS: Interval mildly enlarged cardiac silhouette. Clear lungs with normal vascularity. Stable median sternotomy wires and prosthetic aortic valve. Mild scoliosis.  IMPRESSION: Interval mild cardiomegaly.   Electronically Signed   By: Gordan Payment M.D.   On: 12/03/2014 18:52   Ct Head Wo Contrast  12/03/2014   CLINICAL DATA:  Two day history of vomiting and slurred speech. Acute hyperglycemia, with blood sugars greater than 600. Prior history of stroke.  EXAM: CT HEAD WITHOUT CONTRAST  TECHNIQUE: Contiguous axial images were obtained from the base of the skull through the vertex without intravenous contrast.  COMPARISON:  09/30/2014, 03/08/2014.  FINDINGS: Encephalomalacia involving the right frontal, temporal and parietal lobes, unchanged. Encephalomalacia involving the occipital lobes bilaterally, left greater than right, unchanged. Focal encephalomalacia involving the left posterior frontal lobe, unchanged Ventricular system normal in size and appearance for age. Severe changes of small vessel disease of the white matter, unchanged. No mass lesion. No midline shift. No acute hemorrhage or hematoma. No extra-axial fluid collections. No evidence of acute infarction.  No skull fracture or other focal osseous abnormality involving the skull. Visualized paranasal sinuses, bilateral mastoid air cells and bilateral middle ear cavities well-aerated. Extensive bilateral carotid siphon atherosclerosis.  IMPRESSION: 1. No acute intracranial abnormality. 2. Multiple cortical strokes including a large right MCA  distribution stroke, bilateral PCA distribution strokes, and a focal left MCA distribution stroke.   Electronically Signed   By: Hulan Saas M.D.   On: 12/03/2014 16:59   Dg Chest Port 1 View  12/05/2014   CLINICAL DATA:  Subsequent encounter for followup of pulmonary infiltrate. Coronary artery disease.  EXAM: PORTABLE CHEST - 1 VIEW  COMPARISON:  One day prior  FINDINGS: Support apparatus: Right internal jugular line with tip at low SVC. Prior median sternotomy.  Cardiomediastinal silhouette: Mildly enlarged.  Pleura:  No pleural  effusion or pneumothorax.  Lungs: No congestive failure.  Clear lungs.  Other: None  IMPRESSION: Cardiomegaly, without congestive failure or acute disease.   Electronically Signed   By: Jeronimo Greaves M.D.   On: 12/05/2014 07:10   Dg Chest Port 1 View  12/04/2014   CLINICAL DATA:  Central line placement.  Initial encounter.  EXAM: PORTABLE CHEST - 1 VIEW  COMPARISON:  Chest radiograph performed earlier today at 6:40 p.m.  FINDINGS: The patient's right IJ line is seen ending about the cavoatrial junction.  The lungs are well-aerated. Mild vascular congestion is noted, with mildly increased interstitial markings, raising concern for mild interstitial edema. There is no evidence of pleural effusion or pneumothorax.  The cardiomediastinal silhouette is borderline normal in size. The patient is status post median sternotomy. No acute osseous abnormalities are seen.  IMPRESSION: 1. Right IJ line noted ending about the cavoatrial junction. 2. Mild vascular congestion, with mildly increased interstitial markings, raising concern for mild interstitial edema.   Electronically Signed   By: Roanna Raider M.D.   On: 12/04/2014 02:37    Scheduled Meds: . antiseptic oral rinse  7 mL Mouth Rinse q12n4p  . aspirin  81 mg Oral Daily  . chlorhexidine  15 mL Mouth Rinse BID  . feeding supplement (GLUCERNA SHAKE)  237 mL Oral BID BM  . insulin aspart  0-15 Units Subcutaneous 6 times per day   . insulin detemir  25 Units Subcutaneous Daily  . pantoprazole  40 mg Oral Daily  . piperacillin-tazobactam (ZOSYN)  IV  3.375 g Intravenous Q8H  . potassium chloride  40 mEq Oral Once  . vancomycin  750 mg Intravenous Q12H  . Warfarin - Pharmacist Dosing Inpatient   Does not apply q1800   Continuous Infusions: . sodium chloride 10 mL/hr (12/04/14 0748)  . sodium chloride      Active Problems:   DKA (diabetic ketoacidoses)   CAD (coronary artery disease)   HTN (hypertension)   Rheumatoid arthritis   S/P AVR   Type 1 diabetes mellitus with neurological manifestations   Diabetic ketoacidosis without coma associated with type 1 diabetes mellitus   Spastic hemiplegia affecting nondominant side   Severe sepsis with acute organ dysfunction   Lactic acidosis    Time spent: 35 min    Jeralyn Bennett  [facility] Pager [phone]. If 7PM-7AM, please contact night-coverage at [email or web address], password [facility] 12/05/2014, 10:30 AM  LOS: 2 days

## 2014-12-05 NOTE — Progress Notes (Addendum)
CRITICAL VALUE ALERT  Critical value received:  Troponin I 15.04  Date of notification:  12/05/14  Time of notification:  0045  Critical value read back:Yes.    Nurse who received alert:  Iran Ouch  MD notified (1st page):  Everett Graff & Delila Pereyra (Cardiology)   Time of first page:  1250 and 0100  MD notified (2nd page):  Time of second page:   Responding MD:  Claiborne Billings & Delila Pereyra   Time MD responded:  1255 Claiborne Billings) 0130 Delila Pereyra)

## 2014-12-05 NOTE — Progress Notes (Signed)
PULMONARY / CRITICAL CARE MEDICINE   Name: Madison [name] MRN: [id number] DOB: Jul 27, 1961    ADMISSION DATE:  12/03/2014 CONSULTATION DATE:  12/03/14  REFERRING MD :  Dr. Adela Glimpse  CHIEF COMPLAINT:  Altered mental status  INITIAL PRESENTATION:  54 yo female with DKA and possible sepsis.  STUDIES:  1/15 CT head >> encephalomalacia Rt frontal/temporal/parietal lobes, b/l occipital lobes, Lt frontal lobe 1/16 Echo >> EF 45 to 50%, severe concentric hypertrophy, PAS 31 mmHg  SIGNIFICANT EVENTS: 1/15 Admit 1/17 Cardiology consulted, transition off insulin gtt  SUBJECTIVE:  Feels better.  Tolerated diet.  Denies chest pain or dyspnea.  VITAL SIGNS: Temp:  [98.5 F (36.9 C)-100.8 F (38.2 C)] 98.5 F (36.9 C) (01/17 0800) Pulse Rate:  [86-104] 90 (01/17 0600) Resp:  [11-33] 18 (01/17 0600) SpO2:  [94 %-100 %] 95 % (01/17 0600) Arterial Line BP: (97-150)/(38-60) 106/54 mmHg (01/17 0600) Weight:  [175 lb 4.3 oz (79.5 kg)] 175 lb 4.3 oz (79.5 kg) (01/16 1351) INTAKE / OUTPUT:  Intake/Output Summary (Last 24 hours) at 12/05/14 0853 Last data filed at 12/05/14 0600  Gross per 24 hour  Intake 4211.91 ml  Output   4050 ml  Net 161.91 ml    PHYSICAL EXAMINATION: General: pleasant Neuro: more alert, follows commands, moves all extremities HEENT: no sinus tenderness Cardiovascular: regular, 2/6 SM  Lungs: no wheeze Abdomen:  Soft, nontender, no guarding Musculoskeletal:  No edema, no gross deformities Skin:  No rashes  LABS:  CBC  Recent Labs Lab 12/03/14 1618 12/05/14 0445  WBC 30.0* 16.8*  HGB 10.9* 8.8*  HCT 35.1* 26.4*  PLT 407* 261   Coag's  Recent Labs Lab 12/03/14 1618 12/05/14 0445  INR 1.40 6.23*   BMET  Recent Labs Lab 12/04/14 2120 12/04/14 2336 12/05/14 0312  NA 141 140 139  K 3.6 3.7 3.5  CL 121* 120* 119*  CO2 17* 17* 19  BUN 18 17 15   CREATININE 0.98 1.04 1.00  GLUCOSE 142* 114* 112*   Electrolytes  Recent Labs Lab 12/04/14 0515   12/04/14 1630 12/04/14 2120 12/04/14 2336 12/05/14 0312 12/05/14 0445  CALCIUM  --   < > 7.1* 7.3* 7.4* 7.4*  --   MG 1.5  --  2.0  --   --   --  2.2  PHOS 1.2*  --  2.9  --   --   --  2.6  < > = values in this interval not displayed.   Sepsis Markers  Recent Labs Lab 12/03/14 1618 12/04/14 0250 12/04/14 0410 12/05/14 0445  LATICACIDVEN 7.5* 1.9  --   --   PROCALCITON  --   --  5.24 3.65   ABG  Recent Labs Lab 12/03/14 1615  PHART 7.158*  PCO2ART 18.6*  PO2ART 120.0*   Liver Enzymes  Recent Labs Lab 12/03/14 1814 12/05/14 0445  AST 26 83*  ALT 16 39*  ALKPHOS 99 61  BILITOT 1.4* 0.6  ALBUMIN 3.9 2.6*   Cardiac Enzymes  Recent Labs Lab 12/04/14 1800 12/04/14 2336 12/05/14 0445  TROPONINI 10.71* 15.04* 11.13*   Glucose  Recent Labs Lab 12/04/14 1546 12/04/14 1627 12/04/14 1954 12/05/14 12/05/14 0444 12/05/14 0818  GLUCAP 129* 147* 162* 103* 95 81    Imaging Dg Chest 1 View  12/04/2014   CLINICAL DATA:  Diabetic ketoacidosis.  EXAM: CHEST - 1 VIEW  COMPARISON:  12/03/2014  FINDINGS: Heart size is within normal limits. No evidence of pulmonary infiltrate or pleural effusion. Right jugular central  venous catheter tip remains in the proximal to mid right atrium. Prior median sternotomy noted.  IMPRESSION: No acute findings.   Electronically Signed   By: Myles Rosenthal M.D.   On: 12/04/2014 07:58   Dg Chest 1 View  12/03/2014   CLINICAL DATA:  Altered mental status.  EXAM: CHEST - 1 VIEW  COMPARISON:  10/02/2014.  FINDINGS: Interval mildly enlarged cardiac silhouette. Clear lungs with normal vascularity. Stable median sternotomy wires and prosthetic aortic valve. Mild scoliosis.  IMPRESSION: Interval mild cardiomegaly.   Electronically Signed   By: Gordan Payment M.D.   On: 12/03/2014 18:52   Ct Head Wo Contrast  12/03/2014   CLINICAL DATA:  Two day history of vomiting and slurred speech. Acute hyperglycemia, with blood sugars greater than 600. Prior history  of stroke.  EXAM: CT HEAD WITHOUT CONTRAST  TECHNIQUE: Contiguous axial images were obtained from the base of the skull through the vertex without intravenous contrast.  COMPARISON:  09/30/2014, 03/08/2014.  FINDINGS: Encephalomalacia involving the right frontal, temporal and parietal lobes, unchanged. Encephalomalacia involving the occipital lobes bilaterally, left greater than right, unchanged. Focal encephalomalacia involving the left posterior frontal lobe, unchanged Ventricular system normal in size and appearance for age. Severe changes of small vessel disease of the white matter, unchanged. No mass lesion. No midline shift. No acute hemorrhage or hematoma. No extra-axial fluid collections. No evidence of acute infarction.  No skull fracture or other focal osseous abnormality involving the skull. Visualized paranasal sinuses, bilateral mastoid air cells and bilateral middle ear cavities well-aerated. Extensive bilateral carotid siphon atherosclerosis.  IMPRESSION: 1. No acute intracranial abnormality. 2. Multiple cortical strokes including a large right MCA distribution stroke, bilateral PCA distribution strokes, and a focal left MCA distribution stroke.   Electronically Signed   By: Hulan Saas M.D.   On: 12/03/2014 16:59   Dg Chest Port 1 View  12/05/2014   CLINICAL DATA:  Subsequent encounter for followup of pulmonary infiltrate. Coronary artery disease.  EXAM: PORTABLE CHEST - 1 VIEW  COMPARISON:  One day prior  FINDINGS: Support apparatus: Right internal jugular line with tip at low SVC. Prior median sternotomy.  Cardiomediastinal silhouette: Mildly enlarged.  Pleura:  No pleural effusion or pneumothorax.  Lungs: No congestive failure.  Clear lungs.  Other: None  IMPRESSION: Cardiomegaly, without congestive failure or acute disease.   Electronically Signed   By: Jeronimo Greaves M.D.   On: 12/05/2014 07:10   Dg Chest Port 1 View  12/04/2014   CLINICAL DATA:  Central line placement.  Initial  encounter.  EXAM: PORTABLE CHEST - 1 VIEW  COMPARISON:  Chest radiograph performed earlier today at 6:40 p.m.  FINDINGS: The patient's right IJ line is seen ending about the cavoatrial junction.  The lungs are well-aerated. Mild vascular congestion is noted, with mildly increased interstitial markings, raising concern for mild interstitial edema. There is no evidence of pleural effusion or pneumothorax.  The cardiomediastinal silhouette is borderline normal in size. The patient is status post median sternotomy. No acute osseous abnormalities are seen.  IMPRESSION: 1. Right IJ line noted ending about the cavoatrial junction. 2. Mild vascular congestion, with mildly increased interstitial markings, raising concern for mild interstitial edema.   Electronically Signed   By: Roanna Raider M.D.   On: 12/04/2014 02:37     ASSESSMENT / PLAN:  PULMONARY A:  Acute respiratory distress 2nd to DKA >> resolved 1/17. P:   Oxygen as needed to keep SpO2 > 92% Bronchial hygiene F/u  CXR as needed  CARDIOVASCULAR Rt IJ CVL 1/15 >> Rt radial aline 1/15 >> 1/17 A:  Hypotension 2nd to hypovolemia. Elevated troponin likely from demand ischemia. Acute systolic heart failure >> EF now 45% (was 60% on Echo 03/09/14). Hx of CAD, HTN, HLD, AS s/p AVR. P:  Monitor hemodynamics Coumadin per pharmacy Continue ASA Cardiology consulted 1/17  RENAL A:   AKI 2nd to volume depletion >> improved. Hypokalemia, hypomagnesemia, hypophosphatemia >> improved. P:   Monitor renal fx, urine outpt F/u electrolytes and replace as needed  GASTROINTESTINAL A:  N/V 2nd to DKA >> improved. P:   Advance diet as tolerated Protonix for SUP  HEMATOLOGIC A:   Leukocytosis. Anemia of critical illness, hemodilution and chronic disease. P:  F/u CBC  INFECTIOUS A:  ?sepsis >> no obvious source; procalcitonin 5.24 from 1/16. P:   Day 3 vancomycin, zosyn  Blood 1/15 >>  ENDOCRINE A:   DKA >> resolved. DM type I. P:    SSI with lantus  NEUROLOGIC A:  Acute encephalopathy 2nd to DKA >> much improved 1/17. Hx of recurrent CVA's. P:   Monitor mental status Defer when to resume outpt lexapro, ambien, tramadol to hospitalist  RHEUMATOLOGY A: Hx of rheumatoid arthritis. P: Hold outpt MTX until issues with infection resolved  PCCM will sign off.  Please call if additional help needed.  Coralyn Helling, MD [facility] Pulmonary/Critical Care 12/05/2014, 8:53 AM Pager:  [phone] After 3pm call: [phone]

## 2014-12-05 NOTE — Progress Notes (Signed)
ANTICOAGULATION CONSULT NOTE   Pharmacy Consult for warfarin/Lovenox Indication: mechanical AVR, atrial fibrillation, hx recurrent CVA  Allergies  Allergen Reactions  . Celebrex [Celecoxib] Rash  . Detrol [Tolterodine] Hives    Patient Measurements: Wt 79.5 kg 12/04/14 Ht 5'3"  Vital Signs: Temp: 99.1 F (37.3 C) (01/17 0400) Pulse Rate: 91 (01/17 0400)  Labs:  Recent Labs  12/03/14 1618  12/04/14 1800 12/04/14 2120 12/04/14 2336 12/05/14 0312 12/05/14 0445  HGB 10.9*  --   --   --   --   --  8.8*  HCT 35.1*  --   --   --   --   --  26.4*  PLT 407*  --   --   --   --   --  261  LABPROT 17.3*  --   --   --   --   --  55.6*  INR 1.40  --   --   --   --   --  6.23*  CREATININE  --   < >  --  0.98 1.04 1.00  --   TROPONINI  --   < > 10.71*  --  15.04*  --  11.13*  < > = values in this interval not displayed.  Estimated Creatinine Clearance: 64.9 mL/min (by C-G formula based on Cr of 1).   Medical History: Past Medical History  Diagnosis Date  . CAD (coronary artery disease)   . Obesity   . Hypercholesteremia   . HTN (hypertension)   . Dyslipidemia   . Stroke syndrome   . Aortic stenosis   . Thrombophlebitis   . Diabetes   . Hyperlipidemia   . Rheumatoid arthritis(714.0)     Medications:  Scheduled:  . antiseptic oral rinse  7 mL Mouth Rinse q12n4p  . aspirin  81 mg Oral Daily  . chlorhexidine  15 mL Mouth Rinse BID  . feeding supplement (GLUCERNA SHAKE)  237 mL Oral BID BM  . insulin aspart  0-15 Units Subcutaneous 6 times per day  . insulin detemir  25 Units Subcutaneous Daily  . pantoprazole (PROTONIX) IV  40 mg Intravenous QHS  . piperacillin-tazobactam (ZOSYN)  IV  3.375 g Intravenous Q8H  . vancomycin  750 mg Intravenous Q12H  . Warfarin - Pharmacist Dosing Inpatient   Does not apply q1800   Infusions:  . sodium chloride 10 mL/hr (12/04/14 0748)  . sodium chloride 100 mL/hr (12/04/14 1359)    Assessment: 53 yoF on chronic warfarin for  mechanical AVR and atrial fibrillation, has hx recurrent CVA while INR subtherapeutic, admitted 12/03/14 with DKA and sepsis. Per medication history and anticoagulation clinic records, patient's warfarin dosage prior to admission was 5 mg daily except 7.5 mg on Wednesdays. INR in clinic was 1.6 on 11/25/14 INR on admission was 1.4 Orders received for pharmacy to dose warfarin during inpatient stay and to add full-dose Lovenox while INR subtherapeutic. Patient received warfarin 10mg  x 1 as a booster dose at 0038   Goal of Therapy:  INR 2.5 - 3.5 Monitor platelets by anticoagulation protocol: Yes   Today, 12/05/14: Lovenox 75 mg (1mg /kg) SQ q12h Inpatient warfarin doses administered: 10 mg, 5 mg INR supratherapeutic this AM Diet: carb-modified, but charted as taking only small amounts Drug-drug interactions:  ASA 81 mg daily (was taking prior to admission) - hx CAD and recurrent CVA Hgb falling Pltc WNL   Plan:  1. DC Lovenox 2. No warfarin today 3. Monitor for signs / symptoms of bleeding  4. PT / INR daily while inpatient 5.Follow H/H, pltc, clinical course.  Elie Goody, PharmD, BCPS Pager: [phone] 12/05/2014  6:24 AM

## 2014-12-05 NOTE — Progress Notes (Signed)
CRITICAL VALUE ALERT  Critical value received:  INR 6.23  Date of notification:  12/05/14  Time of notification:  0518  Critical value read back:Yes.    Nurse who received alert:  Hazel Sams  MD notified (1st page):  Everett Graff  Time of first page:  [phone]  MD notified (2nd page):  Time of second page:  Responding MD:  Everett Graff  Time MD responded:  [phone]

## 2014-12-05 NOTE — Progress Notes (Signed)
Subjective:  She feels fine this morning.  No shortness of breath or chest pain.  Metabolic abnormalities are resolved.  Her troponin peaked at 15 and is coming back down now.  EKG pending today.  Objective:  Vital Signs in the last 24 hours: BP 91/37 mmHg  Pulse 90  Temp(Src) 99.1 F (37.3 C) (Core (Comment))  Resp 18  Ht 5\' 3"  (1.6 m)  Wt 79.5 kg (175 lb 4.3 oz)  BMI 31.05 kg/m2  SpO2 95%  Physical Exam: Pleasant white female in no acute distress, mildly dysarthric. Lungs:  Clear Cardiac:  Regular rhythm, normal S1 and S2, no S3, 1 to 2/6 systolic murmur Extremities:  No edema present  Intake/Output from previous day: 01/16 0701 - 01/17 0700 In: 4365.6 [P.O.:300; I.V.:2711.6; IV Piggyback:1354] Out: 4100 [Urine:4100]  Weight Filed Weights   12/04/14 1351  Weight: 79.5 kg (175 lb 4.3 oz)    Lab Results: Basic Metabolic Panel:  Recent Labs  12/06/14 2336 12/05/14 0312  NA 140 139  K 3.7 3.5  CL 120* 119*  CO2 17* 19  GLUCOSE 114* 112*  BUN 17 15  CREATININE 1.04 1.00   CBC:  Recent Labs  12/03/14 1618 12/05/14 0445  WBC 30.0* 16.8*  NEUTROABS 27.0*  --   HGB 10.9* 8.8*  HCT 35.1* 26.4*  MCV 89.1 83.3  PLT 407* 261   Cardiac Enzymes:  Recent Labs  12/04/14 1800 12/04/14 2336 12/05/14 0445  TROPONINI 10.71* 15.04* 11.13*    Telemetry: Sinus rhythm  Echo:  EF 45-50%, mild anteroapical hypokinesis, normally functioning prosthetic valve  Assessment/Plan:  1.  Non-ST elevation myocardial infarction in the setting of severe DKA and possible sepsis. 2.  Significant anemia. 3.  Diabetes with complications. 4.  Previous prosthetic aortic valve  Recommendations:  Await EKG today.  In light of her troponin elevation and wall motion abnormality, she will need a cardiac cath when she is over her acute illness and her metabolic abnormality stable and she is off supportive therapy.  This could be stress induced or Takatsubo, but she did have  nonobstructive coronary disease before and thus need to be sure that she has not developed interval.  Coronary artery disease.  Would suggest holding warfarin for now in anticipation of cardiac catheterization and bridging with either Lovenox or heparin.   12/07/14  MD [facility]  12/05/2014, 8:15 AM

## 2014-12-06 ENCOUNTER — Ambulatory Visit: Payer: Medicare HMO | Admitting: Family

## 2014-12-06 DIAGNOSIS — I25118 Atherosclerotic heart disease of native coronary artery with other forms of angina pectoris: Secondary | ICD-10-CM

## 2014-12-06 LAB — CBC
HCT: 25.1 % — ABNORMAL LOW (ref 36.0–46.0)
Hemoglobin: 8.2 g/dL — ABNORMAL LOW (ref 12.0–15.0)
MCH: 28.1 pg (ref 26.0–34.0)
MCHC: 32.7 g/dL (ref 30.0–36.0)
MCV: 86 fL (ref 78.0–100.0)
PLATELETS: 171 10*3/uL (ref 150–400)
RBC: 2.92 MIL/uL — AB (ref 3.87–5.11)
RDW: 18 % — AB (ref 11.5–15.5)
WBC: 7.3 10*3/uL (ref 4.0–10.5)

## 2014-12-06 LAB — GLUCOSE, CAPILLARY
GLUCOSE-CAPILLARY: 171 mg/dL — AB (ref 70–99)
Glucose-Capillary: 86 mg/dL (ref 70–99)
Glucose-Capillary: 130 mg/dL — ABNORMAL HIGH (ref 70–99)
Glucose-Capillary: 136 mg/dL — ABNORMAL HIGH (ref 70–99)
Glucose-Capillary: 86 mg/dL (ref 70–99)

## 2014-12-06 LAB — BASIC METABOLIC PANEL
Anion gap: 5 (ref 5–15)
BUN: 9 mg/dL (ref 6–23)
CALCIUM: 7.8 mg/dL — AB (ref 8.4–10.5)
CO2: 25 mmol/L (ref 19–32)
Chloride: 117 mEq/L — ABNORMAL HIGH (ref 96–112)
Creatinine, Ser: 0.77 mg/dL (ref 0.50–1.10)
GFR calc non Af Amer: >90 mL/min (ref >90)
GLUCOSE: 75 mg/dL (ref 70–99)
Potassium: 3.9 mmol/L (ref 3.5–5.1)
Sodium: 147 mmol/L — ABNORMAL HIGH (ref 135–145)

## 2014-12-06 LAB — PROTIME-INR
INR: 5.48 — AB (ref 0.00–1.49)
Prothrombin Time: 50.3 seconds — ABNORMAL HIGH (ref 11.6–15.2)

## 2014-12-06 LAB — VANCOMYCIN, TROUGH: VANCOMYCIN TR: 14.3 ug/mL (ref 10.0–20.0)

## 2014-12-06 MED ORDER — ZOLPIDEM TARTRATE 5 MG PO TABS
5.0000 mg | ORAL_TABLET | Freq: Once | ORAL | Status: AC
  Administered 2014-12-06: 5 mg via ORAL
  Filled 2014-12-06: qty 1

## 2014-12-06 MED ORDER — SODIUM CHLORIDE 0.9 % IJ SOLN
10.0000 mL | INTRAMUSCULAR | Status: DC | PRN
  Administered 2014-12-06 – 2014-12-08 (×5): 10 mL
  Filled 2014-12-06 (×5): qty 40

## 2014-12-06 MED ORDER — SODIUM CHLORIDE 0.9 % IJ SOLN
10.0000 mL | Freq: Two times a day (BID) | INTRAMUSCULAR | Status: DC
  Administered 2014-12-07 – 2014-12-09 (×3): 10 mL

## 2014-12-06 MED ORDER — PHYTONADIONE 5 MG PO TABS
5.0000 mg | ORAL_TABLET | Freq: Once | ORAL | Status: AC
  Administered 2014-12-06: 5 mg via ORAL
  Filled 2014-12-06: qty 1

## 2014-12-06 MED ORDER — DARIFENACIN HYDROBROMIDE ER 7.5 MG PO TB24
7.5000 mg | ORAL_TABLET | Freq: Every day | ORAL | Status: DC
  Administered 2014-12-06 – 2014-12-15 (×10): 7.5 mg via ORAL
  Filled 2014-12-06 (×10): qty 1

## 2014-12-06 NOTE — Clinical Documentation Improvement (Signed)
  Query #1 Please clarify if the diagnoses of "Sepsis" and "Septic Shock" have been confirmed or ruled out.  Query #2 "Hypotension 2/2 hypovolemia" documented in PCCM pn 12/04/14.  Corrected with IV fluid administration.  Please document if a condition below provides greater specificity regarding the patient's hypotension:  - Hypovolemic Shock 2/2 Dehydration  - Other Condition  - Unable to Clinically Determine   Thank You, Jerral Ralph ,RN Clinical Documentation Specialist:  [phone] [facility]- Health Information Management

## 2014-12-06 NOTE — Progress Notes (Signed)
Subjective:  She feels fine this morning.  No shortness of breath or chest pain.    Her troponin peaked at 15.  EKG pending today.  Objective:  Vital Signs in the last 24 hours: BP 105/51 mmHg  Pulse 72  Temp(Src) 97.9 F (36.6 C) (Oral)  Resp 19  Ht 5\' 3"  (1.6 m)  Wt 175 lb 4.3 oz (79.5 kg)  BMI 31.05 kg/m2  SpO2 98%  Physical Exam: Pleasant white female in no acute distress, mildly dysarthric. Lungs:  Clear Cardiac:  Regular rhythm, normal S1 and S2, no S3, 1 to 2/6 systolic murmur Extremities:  No edema present  Intake/Output from previous day: 01/17 0701 - 01/18 0700 In: 2650 [P.O.:320; I.V.:1880; IV Piggyback:450] Out: 3475 [Urine:3475]  Weight Filed Weights   12/04/14 1351  Weight: 175 lb 4.3 oz (79.5 kg)    Lab Results: Basic Metabolic Panel:  Recent Labs  12/06/14 0312 12/06/14 0525  NA 139 147*  K 3.5 3.9  CL 119* 117*  CO2 19 25  GLUCOSE 112* 75  BUN 15 9  CREATININE 1.00 0.77   CBC:  Recent Labs  12/03/14 1618 12/05/14 0445 12/06/14 0525  WBC 30.0* 16.8* 7.3  NEUTROABS 27.0*  --   --   HGB 10.9* 8.8* 8.2*  HCT 35.1* 26.4* 25.1*  MCV 89.1 83.3 86.0  PLT 407* 261 171   Cardiac Enzymes:  Recent Labs  12/05/14 0445 12/05/14 1150 12/05/14 1752  TROPONINI 11.13* 6.55* 5.34*    Telemetry: Sinus rhythm  Echo: Study Conclusions  - Left ventricle: The cavity size was normal. There was mild focal basal and severe concentric hypertrophy of the septum. Systolic function was mildly reduced. The estimated ejection fraction was in the range of 45% to 50%. There is moderate hypokinesis of the mid-apicalanteroseptal myocardium. Doppler parameters are consistent with high ventricular filling pressure. - Aortic valve: A mechanical prosthesis was present and functioning normally. Valve area (VTI): 1.35 cm^2. Valve area (Vmax): 1.21 cm^2. - Mitral valve: Calcified annulus. Mildly thickened leaflets . There was mild  regurgitation. - Pulmonary arteries: PA peak pressure: 31 mm Hg (S).  Impressions:  - There was no evidence of a vegetation.   Assessment/Plan:  1.  Non-ST elevation myocardial infarction versus demand ischemia in the setting of severe DKA and possible sepsis. 2.  Significant anemia. 3.  Diabetes with complications. 4.  Previous prosthetic aortic valve   In light of her troponin elevation and wall motion abnormality, she will need a cardiac cath when she is over her acute illness and her metabolic abnormality stable and she is off supportive therapy.  This could be stress induced or Takatsubo, but she did have nonobstructive coronary disease before and thus need to be sure that she has not developed interval Coronary artery disease.  Recommend holding warfarin for now in anticipation of cardiac catheterization and bridging with either Lovenox or heparin when INR less than 2. Currently INR is supratherapeutic so will give some Vit K today.   [place] Oborn 12/07/14 MD, [facility]  12/06/2014, 7:04 AM

## 2014-12-06 NOTE — Evaluation (Signed)
Physical Therapy Evaluation Patient Details Name: Madison [name] MRN: [id number] DOB: 26-Oct-1961 Today's Date: 12/06/2014   History of Present Illness  54 y.o. female with h/o type 1 DM, multiple CVA with L HP, HTN, aortic valve replacement admitted with AMS, sepsis, DKA.  Clinical Impression  *Pt admitted with above diagnosis. Pt currently with functional limitations due to the deficits listed below (see PT Problem List). ** Pt will benefit from skilled PT to increase their independence and safety with mobility to allow discharge to the venue listed below.    Noted pt's INR is 5.48 today, so limited mobility to bed to chair transfer only. Min/guard assist for taking pivotal steps to recliner. Pt has pre-existing LLE weakness from prior strokes but ambulated without assistive device prior to admission.  **    Follow Up Recommendations Home health PT    Equipment Recommendations  None recommended by PT    Recommendations for Other Services       Precautions / Restrictions Precautions Precautions: Fall Restrictions Weight Bearing Restrictions: No      Mobility  Bed Mobility Overal bed mobility: Modified Independent             General bed mobility comments: HOB up 30*, used rail  Transfers Overall transfer level: Needs assistance Equipment used: None Transfers: Sit to/from Stand Sit to Stand: Min guard         General transfer comment: min/guard for safety   Ambulation/Gait Ambulation/Gait assistance: Min guard Ambulation Distance (Feet): 3 Feet Assistive device: None Gait Pattern/deviations: Step-to pattern     General Gait Details: pt took several side steps from bed to recliner with min/guard assist for safety   Stairs            Wheelchair Mobility    Modified Rankin (Stroke Patients Only)       Balance Overall balance assessment: History of Falls                                           Pertinent Vitals/Pain Pain  Assessment: No/denies pain    Home Living Family/patient expects to be discharged to:: Private residence Living Arrangements: Children (8 y.o. son) Available Help at Discharge: Family;Available PRN/intermittently (sister) Type of Home: Apartment Home Access: Stairs to enter Entrance Stairs-Rails: Can reach both Entrance Stairs-Number of Steps: 8 or 9 Home Layout: One level;Full bath on main level Home Equipment: Cane - single point;Walker - 2 wheels      Prior Function Level of Independence: Needs assistance   Gait / Transfers Assistance Needed: walked without assistive device  ADL's / Homemaking Assistance Needed: sister drives her to appointments, son sometimes helps put on bra  Comments: Pt has button pull and rocker knife at home to help with fine motor deficits. Pt wants to return to this level of independence to take of 65 yo son     Hand Dominance   Dominant Hand: Right    Extremity/Trunk Assessment               Lower Extremity Assessment: LLE deficits/detail   LLE Deficits / Details: ankle DF AROM -10*, LLE +3/5 (weak from prior CVAs)  Cervical / Trunk Assessment: Normal  Communication   Communication: Expressive difficulties (some slurred speech, increased time)  Cognition Arousal/Alertness: Awake/alert Behavior During Therapy: WFL for tasks assessed/performed Overall Cognitive Status: Within Functional Limits for tasks assessed  General Comments      Exercises        Assessment/Plan    PT Assessment Patient needs continued PT services  PT Diagnosis Generalized weakness   PT Problem List Decreased mobility;Decreased strength;Decreased activity tolerance  PT Treatment Interventions Gait training;Functional mobility training;Stair training;Therapeutic activities;Patient/family education;Therapeutic exercise   PT Goals (Current goals can be found in the Care Plan section) Acute Rehab PT Goals Patient Stated Goal: return  to prior level of independence, get LLE stronger PT Goal Formulation: With patient Time For Goal Achievement: 12/20/14 Potential to Achieve Goals: Good    Frequency Min 3X/week   Barriers to discharge        Co-evaluation               End of Session Equipment Utilized During Treatment: Gait belt Activity Tolerance: Patient tolerated treatment well Patient left: in chair;with call bell/phone within reach Nurse Communication: Mobility status         Time: 7001-7494 PT Time Calculation (min) (ACUTE ONLY): 20 min   Charges:   PT Evaluation $Initial PT Evaluation Tier I: 1 Procedure PT Treatments $Therapeutic Activity: 8-22 mins   PT G Codes:        Tamala Ser 12/06/2014, 1:45 PM [phone]

## 2014-12-06 NOTE — Progress Notes (Signed)
ANTICOAGULATION CONSULT NOTE   Pharmacy Consult for Lovenox Indication:  Holding Warfarin; mechanical AVR, atrial fibrillation, hx recurrent CVA  Allergies  Allergen Reactions  . Celebrex [Celecoxib] Rash  . Detrol [Tolterodine] Hives    Patient Measurements: Wt 79.5 kg 12/04/14 Ht 5'3"  Vital Signs: Temp: 98 F (36.7 C) (01/18 0821) BP: 105/51 mmHg (01/18 0600) Pulse Rate: 72 (01/18 0600)  Labs:  Recent Labs  12/03/14 1618  12/04/14 2336 12/05/14 0312 12/05/14 0445 12/05/14 1150 12/05/14 1752 12/06/14 0525  HGB 10.9*  --   --   --  8.8*  --   --  8.2*  HCT 35.1*  --   --   --  26.4*  --   --  25.1*  PLT 407*  --   --   --  261  --   --  171  LABPROT 17.3*  --   --   --  55.6*  --   --  50.3*  INR 1.40  --   --   --  6.23*  --   --  5.48*  CREATININE  --   < > 1.04 1.00  --   --   --  0.77  TROPONINI  --   < > 15.04*  --  11.13* 6.55* 5.34*  --   < > = values in this interval not displayed.  Estimated Creatinine Clearance: 81.1 mL/min (by C-G formula based on Cr of 0.77).   Medications:  Scheduled:  . antiseptic oral rinse  7 mL Mouth Rinse q12n4p  . aspirin  81 mg Oral Daily  . chlorhexidine  15 mL Mouth Rinse BID  . feeding supplement (GLUCERNA SHAKE)  237 mL Oral BID BM  . insulin aspart  0-15 Units Subcutaneous TID WC  . insulin detemir  25 Units Subcutaneous Daily  . pantoprazole  40 mg Oral Daily  . piperacillin-tazobactam (ZOSYN)  IV  3.375 g Intravenous Q8H  . vancomycin  750 mg Intravenous Q12H   Infusions:  . sodium chloride 10 mL/hr at 12/06/14 0007  . sodium chloride 75 mL/hr at 12/06/14 0007    Assessment: 53 yoF on chronic warfarin for mechanical AVR and atrial fibrillation, has hx recurrent CVA while INR subtherapeutic, admitted 12/03/14 with DKA and sepsis.  Per medication history and anticoagulation clinic records, patient's warfarin dosage prior to admission was 5 mg daily except 7.5 mg on Wednesdays.  INR on admission was 1.4,  subtherapeutic.  Pharmacy was consulted to dose warfarin during inpatient stay and to add full-dose Lovenox while INR subtherapeutic.  Significant events: 1/16 warfarin 10mg  PO given as boosted dose at 0100, additional 5mg  given as scheduled dose at 1800 1/17 INR significantly increased and supratherapeutic, warfarin held, Lovenox DC.   Today, 12/06/2014:  INR 5.48 (Vitamin K 5mg  PO x1 per MD)  CBC:  Hgb 8.2, Plt 171  No bleeding per RN report.  Diet: carb-modified, but charted as eating only small amounts  Drug-drug interactions: ASA 81 mg daily (was taking prior to admission for hx CAD and recurrent CVA)  Cardiology planning for cardiac cath with recommendation to hold warfarin and bridge with Lovenox.   Goal of Therapy:  INR 2.5 - 3.5 Anti-Xa level 0.6-1 units/ml 4hrs after LMWH dose given Monitor platelets by anticoagulation protocol: Yes   Plan:   D/C Warfarin orders and pharmacy consult per MD prior to cardiac cath.  Vitamin K 5mg  PO x1 today per MD  Lovenox 80mg  SQ BID.  Start when INR is  subtherapeutic  Continue daily INR, CBC  Monitor for signs / symptoms of bleeding  Follow H/H, pltc, clinical course.  Lynann Beaver PharmD, BCPS Pager [phone] 12/06/2014 9:01 AM

## 2014-12-06 NOTE — Progress Notes (Signed)
1CRITICAL VALUE ALERT  Critical value received:  INR 5.48  Date of notification:  12/06/2014  Time of notification:  0630  Critical value read back:Yes.    Nurse who received alert:  mk  MD notified (1st page):  na  Time of first page:  na  MD notified (2nd page):  Time of second page:  Responding MD:  na  Time MD responded:  Na Improved from result previously reported.

## 2014-12-06 NOTE — Progress Notes (Signed)
Foley cath removed. Patient tolerated well.

## 2014-12-06 NOTE — Progress Notes (Addendum)
[facility] PROGRESS NOTE  Madison [name] GBE:[id number] DOB: 1961-01-15 DOA: 12/03/2014 PCP: Janell Quiet, FNP  Interim summary Patient is a 54 year old female with a past medical history of type 1 diabetes mellitus, history of aortic valve replacement, hypertension, dyslipidemia, history of CVA who was admitted to the medicine service on 12/03/2014. She presented to the emergency room with mental status changes, having initial blood pressure of 80/66. Initial workup revealed diabetic ketoacidosis having an anion gap of 29 with bicarbonate of 8 and a glucose of 1047. Her white count was 30,000. Patient likely to be septic although did not have an obvious source of infection. Urinalysis was negative for nitrates and leukocyte esterase, chest x-ray did not reveal obvious infiltrate. She was covered with empiric IV antimicrobial therapy with IV vancomycin and Zosyn. Pulmonary critical care medicine was consulted as she was admitted to the intensive care unit. Her troponins increased to 5.63 for which Cardiology was consulted. Troponin elevation felt to be secondary to demand ischemia. We'll need heart cath once sepsis and metabolic issues resolve. Regard to diabetic ketoacidosis she was transitioned to basal insulin on 12/04/2014 with the discontinuation of IV insulin. Patient tolerating by mouth intake.   Assessment/Plan: 1. Sepsis-resolved,Hypovolemic Shock 2/2 Dehydration, resolved White blood cell count on admission 30,000, pulse of 119, respiratory rate of 25, hypotension with blood pressure of 79/26.  -Workup thus far has not shown source of infection, blood cultures drawn 12/03/2014 showing no growth to date. -She does have a history of aortic valve replacement and cardiology has been consulted. -A transthoracic echocardiogram performed on 12/04/2014 showing ejection fraction of 45-50% with moderate hypokinesis of the mid apical anteroseptal myocardium. Vegetations not seen on 2-D echo.   Discontinue vancomycin after blood culture negative to date, continue IV Zosyn    2.  Diabetic ketoacidosis-resolved -I suspect underlying infectious process precipitating DKA -DKA resolving by 12/04/2014 as her IV insulin was discontinued and transitioned to subcutaneous Levemir. -Blood sugars remain stable -Continue Levemir 25 units subcutaneous daily with Accu-Cheks and sliding scale coverage   3.  Suspected demand ischemia -Patient presenting with hypotension, sepsis/critical illness. Her troponins peaked at 15.04 on 10/05/2015 -Cardiology consulted, patient will need a cardiac cath, received 1 dose of by mouth vitamin K today administered by cardiology. Anticipate cardiac cath when INR less than 2. Initiated on Lovenox for bridging when INR less than 2. -She had a transthoracic echocardiogram done on 12/04/2014 that showed an ejection fraction 45-50% with moderate hypokinesis     4.  Supratherapeutic INR -Patient anticoagulated for history of mechanical aortic valve replacement -A.m. labs on 12/05/2014 showing INR still supratherapeutic -Pharmacy consulted for warfarin management, initiation of Lovenox for INR less than 2   .  5.  Aortic valve replacement -Pharmacy consult for warfarin management, Lovenox management    6.  Hypokalemia -Replaced  7.  Acute kidney injury -Secondary to sepsis and hypovolemia as she initially presented with a creatinine of 2.06 -Resolve with administration of IV fluids  Code Status: Full code Family Communication:  Disposition Plan: Transfer to telemetry., PT/OT eval   Consultants:  Pulmonary critical care medicine  Cardiology    Antibiotics:  Vancomycin  Zosyn  HPI/Subjective: Patient is awake, alert, states feeling little better today. She is tolerating by mouth intake. She presently denies chest pain, shortness of breath, nausea, vomiting.  Objective: Filed Vitals:   12/06/14 0821  BP:   Pulse:   Temp: 98 F  (36.7 C)  Resp:     Intake/Output  Summary (Last 24 hours) at 12/06/14 0938 Last data filed at 12/06/14 0600  Gross per 24 hour  Intake   2630 ml  Output   3375 ml  Net   -745 ml   Filed Weights   12/04/14 1351  Weight: 79.5 kg (175 lb 4.3 oz)    Exam:   General:  She appears improved on this mornings evaluation, awake, alert  Cardiovascular: Tachycardic, regular rate rhythm normal S1-S2  Respiratory: Lungs overall are clear to auscultation bilaterally, normal respiratory effort, no wheezing rhonchi or rales  Abdomen: Soft nontender nondistended  Musculoskeletal: Patient having hematoma over right lower back region, no edema  Data Reviewed: Basic Metabolic Panel:  Recent Labs Lab 12/04/14 0515  12/04/14 1630 12/04/14 2120 12/04/14 2336 12/05/14 0312 12/05/14 0445 12/06/14 0525  NA  --   < > 136 141 140 139  --  147*  K  --   < > 4.1 3.6 3.7 3.5  --  3.9  CL  --   < > 113* 121* 120* 119*  --  117*  CO2  --   < > 16* 17* 17* 19  --  25  GLUCOSE  --   < > 148* 142* 114* 112*  --  75  BUN  --   < > 20 18 17 15   --  9  CREATININE  --   < > 1.03 0.98 1.04 1.00  --  0.77  CALCIUM  --   < > 7.1* 7.3* 7.4* 7.4*  --  7.8*  MG 1.5  --  2.0  --   --   --  2.2  --   PHOS 1.2*  --  2.9  --   --   --  2.6  --   < > = values in this interval not displayed. Liver Function Tests:  Recent Labs Lab 12/03/14 1814 12/05/14 0445  AST 26 83*  ALT 16 39*  ALKPHOS 99 61  BILITOT 1.4* 0.6  PROT 6.7 4.8*  ALBUMIN 3.9 2.6*   No results for input(s): LIPASE, AMYLASE in the last 168 hours. No results for input(s): AMMONIA in the last 168 hours. CBC:  Recent Labs Lab 12/03/14 1618 12/05/14 0445 12/06/14 0525  WBC 30.0* 16.8* 7.3  NEUTROABS 27.0*  --   --   HGB 10.9* 8.8* 8.2*  HCT 35.1* 26.4* 25.1*  MCV 89.1 83.3 86.0  PLT 407* 261 171   Cardiac Enzymes:  Recent Labs Lab 12/04/14 1800 12/04/14 2336 12/05/14 0445 12/05/14 1150 12/05/14 1752  TROPONINI 10.71*  15.04* 11.13* 6.55* 5.34*   BNP (last 3 results)  Recent Labs  09/30/14 1431  PROBNP 794.3*   CBG:  Recent Labs Lab 12/05/14 0818 12/05/14 1225 12/05/14 1629 12/05/14 2015 12/06/14 0746  GLUCAP 81 200* 160* 86 130*    Recent Results (from the past 240 hour(s))  Culture, blood (routine x 2)     Status: None (Preliminary result)   Collection Time: 12/03/14  4:18 PM  Result Value Ref Range Status   Specimen Description BLOOD LEFT WRIST  Final   Special Requests BOTTLES DRAWN AEROBIC ONLY 12/05/14  Final   Culture   Final           BLOOD CULTURE RECEIVED NO GROWTH TO DATE CULTURE WILL BE HELD FOR 5 DAYS BEFORE ISSUING A FINAL NEGATIVE REPORT Performed at    Report Status PENDING  Incomplete  Culture, blood (routine x 2)     Status:  None (Preliminary result)   Collection Time: 12/03/14  4:20 PM  Result Value Ref Range Status   Specimen Description BLOOD RIGHT WRIST  Final   Special Requests BOTTLES DRAWN AEROBIC AND ANAEROBIC 1.5ML  Final   Culture   Final           BLOOD CULTURE RECEIVED NO GROWTH TO DATE CULTURE WILL BE HELD FOR 5 DAYS BEFORE ISSUING A FINAL NEGATIVE REPORT Performed at [facility]    Report Status PENDING  Incomplete  MRSA PCR Screening     Status: None   Collection Time: 12/03/14 11:41 PM  Result Value Ref Range Status   MRSA by PCR NEGATIVE NEGATIVE Final    Comment:        The GeneXpert MRSA Assay (FDA approved for NASAL specimens only), is one component of a comprehensive MRSA colonization surveillance program. It is not intended to diagnose MRSA infection nor to guide or monitor treatment for MRSA infections.      Studies: Dg Chest Port 1 View  12/05/2014   CLINICAL DATA:  Subsequent encounter for followup of pulmonary infiltrate. Coronary artery disease.  EXAM: PORTABLE CHEST - 1 VIEW  COMPARISON:  One day prior  FINDINGS: Support apparatus: Right internal jugular line with tip at low SVC. Prior median  sternotomy.  Cardiomediastinal silhouette: Mildly enlarged.  Pleura:  No pleural effusion or pneumothorax.  Lungs: No congestive failure.  Clear lungs.  Other: None  IMPRESSION: Cardiomegaly, without congestive failure or acute disease.   Electronically Signed   By: Jeronimo Greaves M.D.   On: 12/05/2014 07:10    Scheduled Meds: . antiseptic oral rinse  7 mL Mouth Rinse q12n4p  . aspirin  81 mg Oral Daily  . chlorhexidine  15 mL Mouth Rinse BID  . feeding supplement (GLUCERNA SHAKE)  237 mL Oral BID BM  . insulin aspart  0-15 Units Subcutaneous TID WC  . insulin detemir  25 Units Subcutaneous Daily  . pantoprazole  40 mg Oral Daily  . piperacillin-tazobactam (ZOSYN)  IV  3.375 g Intravenous Q8H  . vancomycin  750 mg Intravenous Q12H   Continuous Infusions: . sodium chloride 10 mL/hr at 12/06/14 0007  . sodium chloride 75 mL/hr at 12/06/14 0007    Active Problems:   CAD (coronary artery disease)   HTN (hypertension)   Rheumatoid arthritis   S/P AVR   Type 1 diabetes mellitus with neurological manifestations   DKA (diabetic ketoacidoses)   Diabetic ketoacidosis without coma associated with type 1 diabetes mellitus   Spastic hemiplegia affecting nondominant side   Severe sepsis with acute organ dysfunction   Lactic acidosis   Blood poisoning   Supratherapeutic INR    Time spent: 35 min    [facility]  [facility] Pager [phone]. If 7PM-7AM, please contact night-coverage at [email or web address], password [facility] 12/06/2014, 9:38 AM  LOS: 3 days

## 2014-12-06 NOTE — Progress Notes (Addendum)
Inpatient Diabetes Program Recommendations  AACE/ADA: New Consensus Statement on Inpatient Glycemic Control (2013)  Target Ranges:  Prepandial:   less than 140 mg/dL      Peak postprandial:   less than 180 mg/dL (1-2 hours)      Critically ill patients:  140 - 180 mg/dL     Results for Madison [name], Madison [name] (MRN [id number]) as of 12/06/2014 12:56  Ref. Range 12/05/2014 08:18 12/05/2014 12:25 12/05/2014 16:29 12/05/2014 20:15  Glucose-Capillary Latest Range: 70-99 mg/dL 81 616 (H) 073 (H) 86    Results for Madison, [name] (MRN [id number]) as of 12/06/2014 12:56  Ref. Range 12/06/2014 07:46  Glucose-Capillary Latest Range: 70-99 mg/dL 546 (H)    HPI: 54 y.o. female w/ past medical history of CAD (coronary artery disease); Obesity; Hypercholesteremia; HTN (hypertension); Dyslipidemia; Stroke syndrome; Aortic stenosis; Thrombophlebitis; Diabetes; Hyperlipidemia; and Rheumatoid arthritis(714.0).   Patient has hx of DM 1, used to be on insuline pump until 2014 when she had a stroke and since then was not able to use.    Presented to ER with vomiting . Patient at first was minimally awake. Was found to have BG 1000, evidence of severe DKA. Trop 0.05  Patient initially poorly responsive BP down to 80/40 and as low as 60/20 given 6L and started on Glucose-stabilizer.   Hospitalist was called for admission for severe DKA and sepsis with end organ damage.    Home DM Meds: Levemir 50 units QHS       Humalog- Not taking (per patient, MD told her to stop taking Humalog)   Current Orders: Levemir 25 units daily     Novolog Moderate SSI    **Spoke with patient this afternoon about her home DM regimen.  Patient told me she used to wear an insulin pump but had to stop using it in 2014 b/c her son moved away and he had to help her with her pump.  Has been taking insulin injections with insulin pens for a long time.  Used to have a PCP in [place], but started seeing Dr. Everardo All (with [facility]) about  one year ago.  Per patient, she is unhappy with her DM care with Dr. Everardo All and is planning to change Endocrinology practices.  Patient told me she used to take Humalog and doesn't understand why Dr. Everardo All had her stop her Humalog and only take one large dose of Levemir daily.  Patient told me she has no problem giving insulin injections and is very willing to take Humalog with meal times in addition to her Levemir if that is what her body needs.  **Discussed with patient the fact that her morning fasting glucose levels are extremely well controlled on Levemir 25 units daily.  Explained to patient that her large dose of Levemir at home may have been covering both her fasting needs and her food at home.  Patient told me she has had issues with hypoglycemia at home especially on days when she does not eat well.    **Since patient's fasting glucose levels are well controlled on Levemir 25 units daily, she may NOT need 50 units Levemir at home.  Patient told me she is willing to take 4 shots of insulin per day (1 shot of Levemir and 3 shots of Novolog or Humalog with meals).  Patient would like a Rx for Humalog or Novolog at time of d/c.  She is also interested in seeking another physician for her DM management.  Gave patient the names and phone numbers of  three different endocrinology practices here in [place].  Patient will have to contact new MD to make appointment.   MD- When patient is ready for d/c, please consider the following:  1. Sending patient home on reduced dose of Levemir (well controlled on current dose of Levemir 25 units daily) 2. Please give patient a Rx for Humalog or Novolog along with an SSI regimen to follow   Will follow Ambrose Finland RN, MSN, CDE Diabetes Coordinator Inpatient Diabetes Program Team Pager: [phone] (8a-10p)

## 2014-12-07 LAB — CBC
HCT: 28.2 % — ABNORMAL LOW (ref 36.0–46.0)
HEMOGLOBIN: 9.1 g/dL — AB (ref 12.0–15.0)
MCH: 28 pg (ref 26.0–34.0)
MCHC: 32.3 g/dL (ref 30.0–36.0)
MCV: 86.8 fL (ref 78.0–100.0)
Platelets: 177 10*3/uL (ref 150–400)
RBC: 3.25 MIL/uL — ABNORMAL LOW (ref 3.87–5.11)
RDW: 17.9 % — ABNORMAL HIGH (ref 11.5–15.5)
WBC: 6.1 10*3/uL (ref 4.0–10.5)

## 2014-12-07 LAB — COMPREHENSIVE METABOLIC PANEL
ALBUMIN: 2.8 g/dL — AB (ref 3.5–5.2)
ALT: 74 U/L — ABNORMAL HIGH (ref 0–35)
AST: 90 U/L — AB (ref 0–37)
Alkaline Phosphatase: 90 U/L (ref 39–117)
Anion gap: 4 — ABNORMAL LOW (ref 5–15)
BUN: 7 mg/dL (ref 6–23)
CHLORIDE: 114 meq/L — AB (ref 96–112)
CO2: 25 mmol/L (ref 19–32)
Calcium: 8.2 mg/dL — ABNORMAL LOW (ref 8.4–10.5)
Creatinine, Ser: 0.75 mg/dL (ref 0.50–1.10)
Glucose, Bld: 107 mg/dL — ABNORMAL HIGH (ref 70–99)
Potassium: 4 mmol/L (ref 3.5–5.1)
SODIUM: 143 mmol/L (ref 135–145)
Total Bilirubin: 0.7 mg/dL (ref 0.3–1.2)
Total Protein: 5.5 g/dL — ABNORMAL LOW (ref 6.0–8.3)

## 2014-12-07 LAB — GLUCOSE, CAPILLARY
GLUCOSE-CAPILLARY: 85 mg/dL (ref 70–99)
Glucose-Capillary: 33 mg/dL — CL (ref 70–99)
Glucose-Capillary: 37 mg/dL — CL (ref 70–99)
Glucose-Capillary: 245 mg/dL — ABNORMAL HIGH (ref 70–99)
Glucose-Capillary: 134 mg/dL — ABNORMAL HIGH (ref 70–99)
Glucose-Capillary: 61 mg/dL — ABNORMAL LOW (ref 70–99)
Glucose-Capillary: 98 mg/dL (ref 70–99)
Glucose-Capillary: 201 mg/dL — ABNORMAL HIGH (ref 70–99)

## 2014-12-07 LAB — PROTIME-INR
INR: 1.83 — AB (ref 0.00–1.49)
PROTHROMBIN TIME: 21.3 s — AB (ref 11.6–15.2)

## 2014-12-07 LAB — PROCALCITONIN: PROCALCITONIN: 0.54 ng/mL

## 2014-12-07 MED ORDER — DEXTROSE 50 % IV SOLN
INTRAVENOUS | Status: AC
  Filled 2014-12-07: qty 50

## 2014-12-07 MED ORDER — INSULIN DETEMIR 100 UNIT/ML ~~LOC~~ SOLN
20.0000 [IU] | Freq: Every day | SUBCUTANEOUS | Status: DC
  Administered 2014-12-09: 20 [IU] via SUBCUTANEOUS
  Filled 2014-12-07 (×3): qty 0.2

## 2014-12-07 MED ORDER — METOPROLOL TARTRATE 12.5 MG HALF TABLET
12.5000 mg | ORAL_TABLET | Freq: Two times a day (BID) | ORAL | Status: DC
Start: 2014-12-07 — End: 2014-12-15
  Administered 2014-12-07 – 2014-12-15 (×15): 12.5 mg via ORAL
  Filled 2014-12-07 (×19): qty 1

## 2014-12-07 MED ORDER — ENOXAPARIN SODIUM 80 MG/0.8ML ~~LOC~~ SOLN
80.0000 mg | Freq: Two times a day (BID) | SUBCUTANEOUS | Status: DC
  Administered 2014-12-07: 80 mg via SUBCUTANEOUS
  Filled 2014-12-07 (×3): qty 0.8

## 2014-12-07 MED ORDER — INSULIN ASPART 100 UNIT/ML ~~LOC~~ SOLN
3.0000 [IU] | Freq: Three times a day (TID) | SUBCUTANEOUS | Status: DC
  Administered 2014-12-07 – 2014-12-10 (×5): 3 [IU] via SUBCUTANEOUS

## 2014-12-07 MED ORDER — ZOLPIDEM TARTRATE 5 MG PO TABS
5.0000 mg | ORAL_TABLET | Freq: Every evening | ORAL | Status: DC | PRN
  Administered 2014-12-07 – 2014-12-13 (×7): 5 mg via ORAL
  Filled 2014-12-07 (×7): qty 1

## 2014-12-07 NOTE — Progress Notes (Signed)
Subjective:  She feels fine this morning.  No shortness of breath or chest pain.    Her troponin peaked at 15.    Objective:  Vital Signs in the last 24 hours: BP 120/59 mmHg  Pulse 76  Temp(Src) 98.5 F (36.9 C) (Oral)  Resp 18  Ht 5\' 3"  (1.6 m)  Wt 175 lb 4.3 oz (79.5 kg)  BMI 31.05 kg/m2  SpO2 100%  Physical Exam: Pleasant white female in no acute distress, mildly dysarthric. Lungs:  Clear Cardiac:  Regular rhythm, normal S1 and S2, no S3, 1 to 2/6 systolic murmur Extremities:  No edema present  Intake/Output from previous day: 01/18 0701 - 01/19 0700 In: 2160 [P.O.:360; I.V.:1800] Out: 1550 [Urine:1550]  Weight Filed Weights   12/04/14 1351  Weight: 175 lb 4.3 oz (79.5 kg)    Lab Results: Basic Metabolic Panel:  Recent Labs  12/06/14 0525 12/07/14 0500  NA 147* 143  K 3.9 4.0  CL 117* 114*  CO2 25 25  GLUCOSE 75 107*  BUN 9 7  CREATININE 0.77 0.75   CBC:  Recent Labs  12/06/14 0525 12/07/14 0500  WBC 7.3 6.1  HGB 8.2* 9.1*  HCT 25.1* 28.2*  MCV 86.0 86.8  PLT 171 177   Cardiac Enzymes:  Recent Labs  12/05/14 0445 12/05/14 1150 12/05/14 1752  TROPONINI 11.13* 6.55* 5.34*    Telemetry: Sinus rhythm  Echo: Study Conclusions  - Left ventricle: The cavity size was normal. There was mild focal basal and severe concentric hypertrophy of the septum. Systolic function was mildly reduced. The estimated ejection fraction was in the range of 45% to 50%. There is moderate hypokinesis of the mid-apicalanteroseptal myocardium. Doppler parameters are consistent with high ventricular filling pressure. - Aortic valve: A mechanical prosthesis was present and functioning normally. Valve area (VTI): 1.35 cm^2. Valve area (Vmax): 1.21 cm^2. - Mitral valve: Calcified annulus. Mildly thickened leaflets . There was mild regurgitation. - Pulmonary arteries: PA peak pressure: 31 mm Hg (S).  Impressions:  - There was no evidence of a  vegetation.  Ecg: NSR, low voltage. T wave inversion anteriorly.  INR 1.83 today.  Assessment/Plan:  1.  Non-ST elevation myocardial infarction versus demand ischemia in the setting of severe DKA and possible sepsis. Possible Takotsubo syndrome.  2.  Significant anemia. 3.  Diabetes with complications. 4.  Previous prosthetic mechanical aortic valve  Plan cardiac cath tomorrow. INR decreasing.  Recommend holding warfarin for now in anticipation of cardiac catheterization and start bridging with Lovenox. The procedure and risks were reviewed including but not limited to death, myocardial infarction, stroke, arrythmias, bleeding, transfusion, emergency surgery, dye allergy, or renal dysfunction. The patient voices understanding and is agreeable to proceed.    Madison [name] 12/07/14 MD, [facility]  12/07/2014, 8:11 AM

## 2014-12-07 NOTE — Progress Notes (Signed)
Hypoglycemic Event  CBG: 33  Treatment: 15gm carbohydrate snack  Symptoms: None  Follow-up CBG: Time:0437 CBG Result:85  Possible Reasons for Event: Unknown  Comments/MD notified:protocol followed, pt requested graham crackers, milk and peanut butter    Maple Hudson, Hulan Fray  Remember to initiate Hypoglycemia Order Set & complete

## 2014-12-07 NOTE — Progress Notes (Signed)
ANTICOAGULATION CONSULT NOTE   Pharmacy Consult for Lovenox Indication:  Holding Warfarin; mechanical AVR, atrial fibrillation, hx recurrent CVA  Allergies  Allergen Reactions  . Celebrex [Celecoxib] Rash  . Detrol [Tolterodine] Hives    Patient Measurements: Wt 79.5 kg 12/04/14 Ht 5'3"  Vital Signs: Temp: 98.5 F (36.9 C) (01/19 0438) Temp Source: Oral (01/19 0438) BP: 120/59 mmHg (01/19 0438) Pulse Rate: 76 (01/19 0438)  Labs:  Recent Labs  12/04/14 2336 12/05/14 0312  12/05/14 0445 12/05/14 1150 12/05/14 1752 12/06/14 0525 12/07/14 0500  HGB  --   --   < > 8.8*  --   --  8.2* 9.1*  HCT  --   --   --  26.4*  --   --  25.1* 28.2*  PLT  --   --   --  261  --   --  171 177  LABPROT  --   --   --  55.6*  --   --  50.3* 21.3*  INR  --   --   --  6.23*  --   --  5.48* 1.83*  CREATININE 1.04 1.00  --   --   --   --  0.77  --   TROPONINI 15.04*  --   --  11.13* 6.55* 5.34*  --   --   < > = values in this interval not displayed.  Estimated Creatinine Clearance: 81.1 mL/min (by C-G formula based on Cr of 0.77).   Medications:  Scheduled:  . antiseptic oral rinse  7 mL Mouth Rinse q12n4p  . aspirin  81 mg Oral Daily  . chlorhexidine  15 mL Mouth Rinse BID  . darifenacin  7.5 mg Oral Daily  . dextrose      . enoxaparin (LOVENOX) injection  80 mg Subcutaneous Q12H  . feeding supplement (GLUCERNA SHAKE)  237 mL Oral BID BM  . insulin aspart  0-15 Units Subcutaneous TID WC  . insulin detemir  25 Units Subcutaneous Daily  . pantoprazole  40 mg Oral Daily  . piperacillin-tazobactam (ZOSYN)  IV  3.375 g Intravenous Q8H  . sodium chloride  10-40 mL Intracatheter Q12H   Infusions:  . sodium chloride 10 mL/hr at 12/06/14 0007  . sodium chloride 75 mL/hr at 12/07/14 0422    Assessment: 53 yoF on chronic warfarin for mechanical AVR and atrial fibrillation, has hx recurrent CVA while INR subtherapeutic, admitted 12/03/14 with DKA and sepsis.  Per medication history and  anticoagulation clinic records, patient's warfarin dosage prior to admission was 5 mg daily except 7.5 mg on Wednesdays.  INR on admission was 1.4, subtherapeutic.  Pharmacy was consulted to dose warfarin during inpatient stay and to add full-dose Lovenox while INR subtherapeutic.  Significant events: 1/16 warfarin 10mg  PO given as boosted dose at 0100, additional 5mg  given as scheduled dose at 1800 1/17 INR significantly increased and supratherapeutic, warfarin held, Lovenox DC. 1/18 Vitamin K 5 mg PO x 1 administered per MD order.  Orders also received to resume bridging with Lovenox when INR < 2.5.   Today, 12/07/2014:  INR subtherapeutic (1.83) this AM after vitamin K 5 mg PO yesterday  Hgb improved, pltc WNL  No reports of bleeding.  Latest SCr WNL and estimated CrCl well above 30 mL/min .  Goal of Therapy:  Full-dose Lovenox bridging while off warfarin and INR subtherapeutic. Monitor platelets by anticoagulation protocol: Yes  INR 2.5 - 3.5 when warfarin is resumed post-cardiac-cath   Plan:  1. Begin  Lovenox 80 mg SQ q12h this AM 2. Await word on timing of cardiac cath and when to hold Lovenox preprocedure. 3. Follow H/H, pltc, renal function, clinical course.  Elie Goody, PharmD, BCPS Pager: [phone] 12/07/2014  5:59 AM

## 2014-12-07 NOTE — Progress Notes (Signed)
CARE MANAGEMENT NOTE 12/07/2014  Patient:  Madison [name], Madison [name]   Account Number:  [id number]  Date Initiated:  12/07/2014  Documentation initiated by:  Ferdinand Cava  Subjective/Objective Assessment:   54 yo female admitted with altered mental status and DKA from home     Action/Plan:   discharge planning   Anticipated DC Date:  12/09/2014   Anticipated DC Plan:  HOME W HOME HEALTH SERVICES      DC Planning Services  CM consult      [facility] Choice  HOME HEALTH   Choice offered to / List presented to:  C-1 Patient        HH arranged  HH-2 PT  HH-3 OT      [facility] agency  [facility]   Status of service:  Completed, signed off Medicare Important Message given?   (If response is "NO", the following Medicare IM given date fields will be blank) Date Medicare IM given:   Medicare IM given by:   Date Additional Medicare IM given:   Additional Medicare IM given by:    Discharge Disposition:    Per UR Regulation:    If discussed at Long Length of Stay Meetings, dates discussed:    Comments:  12/07/14 Ferdinand Cava RN BSN CM [phone] Patient lives at home with her 49 year old son. Her sister and a friend drives them to appointments. Patient stated she has a walker, cane, wheelchair and BSC on house. Discussed ordered [facility] services and patient stated that she has used [facility] in the past and would like to have them provide the [facility] services again. She also requested help with Medicaid. She stated that she had Medicaid with [employer] and then moved into a new apartment and was late in receiving and returning the required documentation. Encouraged the patient to contact DSS to follow up on status of application, and patient stated she has the number.

## 2014-12-07 NOTE — Progress Notes (Signed)
Hypoglycemic Event  CBG: 37  Treatment: 15 GM carbohydrate snack  Symptoms: None  Follow-up CBG: Time:0404 CBG Result:33  Possible Reasons for Event: Unknown  Comments/MD notified:protocol initiated, pt requested 2 orange juices    Madison [name] Hulan Fray  Remember to initiate Hypoglycemia Order Set & complete

## 2014-12-07 NOTE — Progress Notes (Addendum)
Hypoglycemic Event  CBG: 61  Treatment: Meal  Symptoms: None  Follow-up CBG: Time: 1829 CBG Result: 98  Possible Reasons for Event: Not enough carbohydrates  Comments/MD notified:    Ninfa Linden  Remember to initiate Hypoglycemia Order Set & complete

## 2014-12-07 NOTE — Evaluation (Signed)
Occupational Therapy Evaluation Patient Details Name: Madison [name] MRN: [id number] DOB: 09-10-1961 Today's Date: 12/07/2014    History of Present Illness 54 y.o. female with h/o type 1 DM, multiple CVA with L HP, HTN, aortic valve replacement admitted with AMS, sepsis, DKA.  Pt to undergo a cardiac cath at [facility] on 1/20.   Clinical Impression   Pt admitted for the above diagnosis and has the deficits listed below. Pt would benefit from cont OT to increase I and safety with basic adl transfers and increase I with pts ability to use LUE in tasks a home.     Follow Up Recommendations  Home health OT;Supervision - Intermittent    Equipment Recommendations       Recommendations for Other Services       Precautions / Restrictions Precautions Precautions: Fall Restrictions Weight Bearing Restrictions: No      Mobility Bed Mobility Overal bed mobility: Modified Independent             General bed mobility comments: HOB up 30*, used rail  Transfers Overall transfer level: Needs assistance Equipment used: None Transfers: Sit to/from [employer] Sit to Stand: Min guard Stand pivot transfers: Min guard       General transfer comment: min/guard for safety     Balance Overall balance assessment: History of Falls                                          ADL Overall ADL's : Needs assistance/impaired Eating/Feeding: Set up;Sitting Eating/Feeding Details (indicate cue type and reason): has necessary equipment at home to do set up. Grooming: Brushing hair;Wash/dry face;Wash/dry hands;Oral care;Set up;Standing   Upper Body Bathing: Set up;Sitting   Lower Body Bathing: Supervison/ safety;Sit to/from stand Lower Body Bathing Details (indicate cue type and reason): S standing.  Pt with low blood sugar this am and stated she was not feeling well so extra S provided. Upper Body Dressing : Minimal assistance;Sitting Upper Body Dressing Details  (indicate cue type and reason): assist with fastners on bra and buttons. Has adaptive equipment at home but sometimes still needs help from son. Lower Body Dressing: Set up;Sit to/from stand   Toilet Transfer: Min guard;Ambulation   Toileting- Clothing Manipulation and Hygiene: Supervision/safety;Sit to/from stand Toileting - Clothing Manipulation Details (indicate cue type and reason): Pt able to manage this all in sitting and this is the safe way she does this at home.     Functional mobility during ADLs: Min guard General ADL Comments: Pt overall did well with adls.  Pt reported not feeling well but did get up and toilet, groom and walk in room.       Vision                     Perception Perception Perception Tested?: Yes   Praxis Praxis Praxis tested?: Within functional limits    Pertinent Vitals/Pain Pain Assessment: No/denies pain     Hand Dominance Right   Extremity/Trunk Assessment Upper Extremity Assessment Upper Extremity Assessment: LUE deficits/detail LUE Deficits / Details: Pt with old CVA with residual deficits.  PROM WFL.  AROM 130 degrees of shoulder flexion, -20 degrees elbow extension, otherwise WFL.  Shoulder strength 3+/5, biceps 3/5, triceps 3+/5, wrist 3+/5, grip 4/5, finger estension 2+/5.  Pt with significant tone in LUE and gets botox shots for L hand/arm function. LUE Sensation: decreased  light touch;decreased proprioception LUE Coordination: decreased fine motor;decreased gross motor   Lower Extremity Assessment Lower Extremity Assessment: Defer to PT evaluation   Cervical / Trunk Assessment Cervical / Trunk Assessment: Normal   Communication Communication Communication: Expressive difficulties   Cognition Arousal/Alertness: Awake/alert Behavior During Therapy: WFL for tasks assessed/performed Overall Cognitive Status: Within Functional Limits for tasks assessed                     General Comments       Exercises        Shoulder Instructions      Home Living Family/patient expects to be discharged to:: Private residence Living Arrangements: Children (51 yr old son) Available Help at Discharge: Family;Available PRN/intermittently (had aid 2 months ago) Type of Home: Apartment Home Access: Stairs to enter Entrance Stairs-Number of Steps: 8 or 9 Entrance Stairs-Rails: Can reach both Home Layout: One level;Full bath on main level     Bathroom Shower/Tub: Tub/shower unit Shower/tub characteristics: Engineer, building services: Standard Bathroom Accessibility: Yes How Accessible: Accessible via walker Home Equipment: Grab bars - tub/shower;Cane - single point;Walker - 2 wheels;Bedside commode   Additional Comments: sister can help but cannot stay with her.      Prior Functioning/Environment Level of Independence: Needs assistance  Gait / Transfers Assistance Needed: walked without assistive device ADL's / Homemaking Assistance Needed: sister drives to appts, pt has been doing laundry/cooking (aid used to do this but aid stopped coming b/c pt lost medicaid...per pt)   Comments: Pt has button pull and rocker knife at home to help with fine motor deficits. Pt wants to return to this level of independence to take of 24 yo son    OT Diagnosis: Generalized weakness;Hemiplegia non-dominant side   OT Problem List: Decreased strength;Decreased range of motion;Decreased coordination;Decreased knowledge of use of DME or AE;Impaired tone;Impaired sensation;Impaired UE functional use   OT Treatment/Interventions: Self-care/ADL training;Neuromuscular education;DME and/or AE instruction;Therapeutic activities    OT Goals(Current goals can be found in the care plan section) Acute Rehab OT Goals Patient Stated Goal: return to prior level of independence, get LLE stronger OT Goal Formulation: With patient Time For Goal Achievement: 12/14/14 Potential to Achieve Goals: Good ADL Goals Pt Will Transfer to Toilet: with  modified independence;ambulating;regular height toilet Pt Will Perform Tub/Shower Transfer: Tub transfer;rolling walker;with supervision Additional ADL Goal #1: Pt will use LUE as gross assist  during all adls with minimal VCS to increase functional use of this limb.  OT Frequency: Min 2X/week   Barriers to D/C: Decreased caregiver support  Pt had an aid to assist but pt states she lost medicaid so she no longer has this aid.  Pt also has HHOT and HHPT she states came until they d/c'd her 2 weeks ago.       Co-evaluation              End of Session Nurse Communication: Mobility status  Activity Tolerance: Patient tolerated treatment well Patient left: in chair;with call bell/phone within reach   Time: 1052-1122 OT Time Calculation (min): 30 min Charges:  OT General Charges $OT Visit: 1 Procedure OT Evaluation $Initial OT Evaluation Tier I: 1 Procedure OT Treatments $Self Care/Home Management : 23-37 mins G-Codes:    Hope Budds 2015-01-03, 11:36 AM  [phone]

## 2014-12-07 NOTE — Progress Notes (Signed)
Inpatient Diabetes Program Recommendations  AACE/ADA: New Consensus Statement on Inpatient Glycemic Control (2013)  Target Ranges:  Prepandial:   less than 140 mg/dL      Peak postprandial:   less than 180 mg/dL (1-2 hours)      Critically ill patients:  140 - 180 mg/dL    Results for Madison [name], Madison [name] (MRN [id number]) as of 12/07/2014 08:29  Ref. Range 12/06/2014 07:46 12/06/2014 13:25 12/06/2014 17:52 12/06/2014 22:19  Glucose-Capillary Latest Range: 70-99 mg/dL 222 (H) 979 (H) 892 (H) 86    Results for Madison [name], Madison [name] (MRN [id number]) as of 12/07/2014 08:29  Ref. Range 12/07/2014 03:43 12/07/2014 04:04 12/07/2014 04:37 12/07/2014 07:28  Glucose-Capillary Latest Range: 70-99 mg/dL 37 (LL) 33 (LL) 85 144 (H)     Home DM Meds: Levemir 50 units QHS  Humalog- Not taking (per patient, MD told her to stop taking Humalog)   Current Orders: Levemir 25 units daily  Novolog Moderate SSI    **Spoke with patient yesterday (01/18) afternoon about her home DM regimen. Patient told me she used to wear an insulin pump but had to stop using it in 2014 b/c her son moved away and he had to help her with her pump. Has been taking insulin injections with insulin pens for a long time. Used to have a PCP in [place], but started seeing Dr. Everardo All (with [facility]) about one year ago. Per patient, she is unhappy with her DM care with Dr. Everardo All and is planning to change Endocrinology practices. Patient told me she used to take Humalog and doesn't understand why Dr. Everardo All had her stop her Humalog and only take one large dose of Levemir daily. Patient told me she has no problem giving insulin injections and is very willing to take Humalog with meal times in addition to her Levemir if that is what her body needs.  **Discussed with patient the fact that her morning fasting glucose levels are extremely well controlled on Levemir 25 units daily. Explained to  patient that her large dose of Levemir at home may have been covering both her fasting needs and her food at home. Patient told me she has had issues with hypoglycemia at home especially on days when she does not eat well.   **Patient may NOT need 50 units Levemir at home. Patient told me she is willing to take 4 shots of insulin per day (1 shot of Levemir and 3 shots of Novolog or Humalog with meals). Patient would like a Rx for Humalog or Novolog at time of d/c. She is also interested in seeking another physician for her DM management. Gave patient the names and phone numbers of three different endocrinology practices here in [place]. Patient will have to contact new MD to make appointment.   MD- Please consider reducing Levemir dose to 20 units QAM (20% reduction)    Also: MD- When patient is ready for d/c, please consider the following:  1. Sending patient home on reduced dose of Levemir 2. Please give patient a Rx for Humalog or Novolog along with an SSI regimen to follow   Will follow Madison Finland RN, MSN, CDE Diabetes Coordinator Inpatient Diabetes Program Team Pager: [phone] (8a-10p)

## 2014-12-07 NOTE — Progress Notes (Signed)
Pt called out stating that she felt her blood glucose was low. Blood glucose checked at 0345, which results showed were 37. Pt remained alert and oriented at this and requested to have 2 orange juices vs D50 IV. Pt given orange juice and glucose rechecked and results showed 33. Pt requested to have graham crackers, peanut butter and milk. Blood glucose rechecked after pt finished eating and was found to be 85.

## 2014-12-07 NOTE — Progress Notes (Signed)
ANTICOAGULATION CONSULT NOTE   Pharmacy Consult for Lovenox Indication:  Holding Warfarin; mechanical AVR, atrial fibrillation, hx recurrent CVA  Allergies  Allergen Reactions  . Celebrex [Celecoxib] Rash  . Detrol [Tolterodine] Hives    Patient Measurements: Wt 79.5 kg 12/04/14 Ht 5'3"  Vital Signs: Temp: 98.5 F (36.9 C) (01/19 0438) Temp Source: Oral (01/19 0438) BP: 120/59 mmHg (01/19 0438) Pulse Rate: 76 (01/19 0438)  Labs:  Recent Labs  12/05/14 0312  12/05/14 0445 12/05/14 1150 12/05/14 1752 12/06/14 0525 12/07/14 0500  HGB  --   < > 8.8*  --   --  8.2* 9.1*  HCT  --   --  26.4*  --   --  25.1* 28.2*  PLT  --   --  261  --   --  171 177  LABPROT  --   --  55.6*  --   --  50.3* 21.3*  INR  --   --  6.23*  --   --  5.48* 1.83*  CREATININE 1.00  --   --   --   --  0.77 0.75  TROPONINI  --   --  11.13* 6.55* 5.34*  --   --   < > = values in this interval not displayed.  Estimated Creatinine Clearance: 81.1 mL/min (by C-G formula based on Cr of 0.75).   Medications:  Scheduled:  . antiseptic oral rinse  7 mL Mouth Rinse q12n4p  . aspirin  81 mg Oral Daily  . chlorhexidine  15 mL Mouth Rinse BID  . darifenacin  7.5 mg Oral Daily  . feeding supplement (GLUCERNA SHAKE)  237 mL Oral BID BM  . insulin aspart  0-15 Units Subcutaneous TID WC  . insulin aspart  3 Units Subcutaneous TID WC  . [START ON 12/08/2014] insulin detemir  20 Units Subcutaneous Daily  . metoprolol tartrate  12.5 mg Oral BID  . pantoprazole  40 mg Oral Daily  . piperacillin-tazobactam (ZOSYN)  IV  3.375 g Intravenous Q8H  . sodium chloride  10-40 mL Intracatheter Q12H   Infusions:  . sodium chloride 10 mL/hr at 12/06/14 0007  . sodium chloride 75 mL/hr at 12/07/14 0422    Assessment: 53 yoF on chronic warfarin for mechanical AVR and atrial fibrillation, has hx recurrent CVA while INR subtherapeutic, admitted 12/03/14 with DKA and sepsis.  Per medication history and anticoagulation clinic  records, patient's warfarin dosage prior to admission was 5 mg daily except 7.5 mg on Wednesdays.  INR on admission was 1.4, subtherapeutic.  Pharmacy was consulted to dose warfarin during inpatient stay and to add full-dose Lovenox while INR subtherapeutic.  Significant events: 1/16 warfarin 10mg  PO given as boosted dose at 0100, additional 5mg  given as scheduled dose at 1800 1/17 INR significantly increased and supratherapeutic, warfarin held, Lovenox DC. 1/18 Vitamin K 5 mg PO x 1 administered per MD order.  Orders also received to resume bridging with Lovenox when INR < 2.5. 1/19 Now planning for cardiac cath on 1/20   Today, 12/07/2014:  INR subtherapeutic (1.83) this AM after vitamin K 5 mg PO yesterday  Hgb improved, pltc WNL  No reports of bleeding.  Latest SCr WNL and estimated CrCl well above 30 mL/min .  Goal of Therapy:  Full-dose Lovenox bridging while off warfarin and INR subtherapeutic. Monitor platelets by anticoagulation protocol: Yes  INR 2.5 - 3.5 when warfarin is resumed post-cardiac-cath   Plan:  1. Hold Lovenox starting with tonight's 1800 dose 2. Follow  up completion of cardiac cath and appropriate resumption of Lovenox 3. Follow H/H, pltc, renal function, clinical course.  Bernadene Person, PharmD Pager: [phone] 12/07/2014, 1:42 PM

## 2014-12-07 NOTE — Progress Notes (Signed)
[facility] PROGRESS NOTE  Madison [name] WER:[id number] DOB: 19-Feb-1961 DOA: 12/03/2014 PCP: Janell Quiet, FNP  Interim summary Patient is a 54 year old female with a past medical history of type 1 diabetes mellitus, history of aortic valve replacement, hypertension, dyslipidemia, history of CVA who was admitted to the medicine service on 12/03/2014. She presented to the emergency room with mental status changes, having initial blood pressure of 80/66. Initial workup revealed diabetic ketoacidosis having an anion gap of 29 with bicarbonate of 8 and a glucose of 1047. Her white count was 30,000. Patient likely to be septic although did not have an obvious source of infection. Urinalysis was negative for nitrates and leukocyte esterase, chest x-ray did not reveal obvious infiltrate. She was covered with empiric IV antimicrobial therapy with IV vancomycin and Zosyn. Pulmonary critical care medicine was consulted as she was admitted to the intensive care unit. Her troponins increased to 5.63 for which Cardiology was consulted. Troponin elevation felt to be secondary to demand ischemia. We'll need heart cath once sepsis and metabolic issues resolve. Regard to diabetic ketoacidosis she was transitioned to basal insulin on 12/04/2014 with the discontinuation of IV insulin. Patient tolerating by mouth intake.   Assessment/Plan: 1. Sepsis-resolved,Hypovolemic Shock 2/2 Dehydration, resolved White blood cell count on admission 30,000, pulse of 119, respiratory rate of 25, hypotension with blood pressure of 79/26.  -Workup thus far has not shown source of infection, blood cultures drawn 12/03/2014 showing no growth to date. -She does have a history of aortic valve replacement and cardiology following -A transthoracic echocardiogram performed on 12/04/2014 showing ejection fraction of 45-50% with moderate hypokinesis of the mid apical anteroseptal myocardium. Vegetations not seen on 2-D echo.   Discontinued vancomycin 1/18 Discontinue Zosyn 1/19 2 chest x-rays negative, UA negative   2.  Diabetic ketoacidosis-resolved -I suspect underlying infectious process precipitating DKA -DKA resolving by 12/04/2014 as her IV insulin was discontinued and transitioned to subcutaneous Levemir. -Blood sugars remain stable Decrease Levemir 20 units subcutaneous daily  Started on pre-meal NovoLog Check hemoglobin A1c   3.  Suspected demand ischemia,Possible Takotsubo syndrome -Patient presenting with hypotension, sepsis/critical illness. Her troponins peaked at 15.04 on 10/05/2015 -Cardiology consulted, patient will need a cardiac cath, received 1 dose of by mouth vitamin K today administered by cardiology. Anticipate cardiac cath tomorrow 2-D echo 12/04/2014 that showed an ejection fraction 45-50% with moderate hypokinesis     4.  Supratherapeutic INR -Patient anticoagulated for history of mechanical aortic valve replacement -A.m. labs on 12/05/2014 showing INR still supratherapeutic -Pharmacy consulted for warfarin management, initiation of Lovenox for INR less than 2   .  5.  Aortic valve replacement -Pharmacy consult for warfarin management, Lovenox management    6.  Hypokalemia -Replaced  7.  Acute kidney injury -Secondary to sepsis and hypovolemia as she initially presented with a creatinine of 2.06 -Resolve with administration of IV fluids  Code Status: Full code Family Communication:  Disposition Plan: Cardiac cath tomorrow, home health PT   Consultants:  Pulmonary critical care medicine  Cardiology    Antibiotics:  Vancomycin  Zosyn  HPI/Subjective: Patient is awake, alert, states feeling little better today. Hypoglycemic last night. She is tolerating by mouth intake. She presently denies chest pain, shortness of breath, nausea, vomiting.  Objective: Filed Vitals:   12/07/14 0438  BP: 120/59  Pulse: 76  Temp: 98.5 F (36.9 C)  Resp: 18     Intake/Output Summary (Last 24 hours) at 12/07/14 1128 Last data filed at 12/07/14 [phone]  Gross per 24 hour  Intake   2050 ml  Output   1550 ml  Net    500 ml   Filed Weights   12/04/14 1351  Weight: 79.5 kg (175 lb 4.3 oz)    Exam:   General:  She appears improved on this mornings evaluation, awake, alert  Cardiovascular: Tachycardic, regular rate rhythm normal S1-S2  Respiratory: Lungs overall are clear to auscultation bilaterally, normal respiratory effort, no wheezing rhonchi or rales  Abdomen: Soft nontender nondistended  Musculoskeletal: Patient having hematoma over right lower back region, no edema  Data Reviewed: Basic Metabolic Panel:  Recent Labs Lab 12/04/14 0515  12/04/14 1630 12/04/14 2120 12/04/14 2336 12/05/14 0312 12/05/14 0445 12/06/14 0525 12/07/14 0500  NA  --   < > 136 141 140 139  --  147* 143  K  --   < > 4.1 3.6 3.7 3.5  --  3.9 4.0  CL  --   < > 113* 121* 120* 119*  --  117* 114*  CO2  --   < > 16* 17* 17* 19  --  25 25  GLUCOSE  --   < > 148* 142* 114* 112*  --  75 107*  BUN  --   < > 20 18 17 15   --  9 7  CREATININE  --   < > 1.03 0.98 1.04 1.00  --  0.77 0.75  CALCIUM  --   < > 7.1* 7.3* 7.4* 7.4*  --  7.8* 8.2*  MG 1.5  --  2.0  --   --   --  2.2  --   --   PHOS 1.2*  --  2.9  --   --   --  2.6  --   --   < > = values in this interval not displayed. Liver Function Tests:  Recent Labs Lab 12/03/14 1814 12/05/14 0445 12/07/14 0500  AST 26 83* 90*  ALT 16 39* 74*  ALKPHOS 99 61 90  BILITOT 1.4* 0.6 0.7  PROT 6.7 4.8* 5.5*  ALBUMIN 3.9 2.6* 2.8*   No results for input(s): LIPASE, AMYLASE in the last 168 hours. No results for input(s): AMMONIA in the last 168 hours. CBC:  Recent Labs Lab 12/03/14 1618 12/05/14 0445 12/06/14 0525 12/07/14 0500  WBC 30.0* 16.8* 7.3 6.1  NEUTROABS 27.0*  --   --   --   HGB 10.9* 8.8* 8.2* 9.1*  HCT 35.1* 26.4* 25.1* 28.2*  MCV 89.1 83.3 86.0 86.8  PLT 407* 261 171 177   Cardiac  Enzymes:  Recent Labs Lab 12/04/14 1800 12/04/14 2336 12/05/14 0445 12/05/14 1150 12/05/14 1752  TROPONINI 10.71* 15.04* 11.13* 6.55* 5.34*   BNP (last 3 results)  Recent Labs  09/30/14 1431  PROBNP 794.3*   CBG:  Recent Labs Lab 12/06/14 2219 12/07/14 0343 12/07/14 0404 12/07/14 0437 12/07/14 0728  GLUCAP 86 37* 33* 85 245*    Recent Results (from the past 240 hour(s))  Culture, blood (routine x 2)     Status: None (Preliminary result)   Collection Time: 12/03/14  4:18 PM  Result Value Ref Range Status   Specimen Description BLOOD LEFT WRIST  Final   Special Requests BOTTLES DRAWN AEROBIC ONLY  Final   Culture   Final           BLOOD CULTURE RECEIVED NO GROWTH TO DATE CULTURE WILL BE HELD FOR 5 DAYS BEFORE ISSUING A FINAL NEGATIVE REPORT Performed at [facility]    Report Status PENDING  Incomplete  Culture, blood (routine x 2)     Status: None (Preliminary result)   Collection Time: 12/03/14  4:20 PM  Result Value Ref Range Status   Specimen Description BLOOD RIGHT WRIST  Final   Special Requests BOTTLES DRAWN AEROBIC AND ANAEROBIC 1.5ML  Final   Culture   Final           BLOOD CULTURE RECEIVED NO GROWTH TO DATE CULTURE WILL BE HELD FOR 5 DAYS BEFORE ISSUING A FINAL NEGATIVE REPORT Performed at [employer] [facility]    Report Status PENDING  Incomplete  MRSA PCR Screening     Status: None   Collection Time: 12/03/14 11:41 PM  Result Value Ref Range Status   MRSA by PCR NEGATIVE NEGATIVE Final    Comment:        The GeneXpert MRSA Assay (FDA approved for NASAL specimens only), is one component of a comprehensive MRSA colonization surveillance program. It is not intended to diagnose MRSA infection nor to guide or monitor treatment for MRSA infections.      Studies: No results found.  Scheduled Meds: . antiseptic oral rinse  7 mL Mouth Rinse q12n4p  . aspirin  81 mg Oral Daily  . chlorhexidine  15 mL Mouth Rinse BID  . darifenacin   7.5 mg Oral Daily  . enoxaparin (LOVENOX) injection  80 mg Subcutaneous Q12H  . feeding supplement (GLUCERNA SHAKE)  237 mL Oral BID BM  . insulin aspart  0-15 Units Subcutaneous TID WC  . [START ON 12/08/2014] insulin detemir  20 Units Subcutaneous Daily  . metoprolol tartrate  12.5 mg Oral BID  . pantoprazole  40 mg Oral Daily  . piperacillin-tazobactam (ZOSYN)  IV  3.375 g Intravenous Q8H  . sodium chloride  10-40 mL Intracatheter Q12H   Continuous Infusions: . sodium chloride 10 mL/hr at 12/06/14 0007  . sodium chloride 75 mL/hr at 12/07/14 0422    Active Problems:   CAD (coronary artery disease)   HTN (hypertension)   Rheumatoid arthritis   S/P AVR   Type 1 diabetes mellitus with neurological manifestations   DKA (diabetic ketoacidoses)   Diabetic ketoacidosis without coma associated with type 1 diabetes mellitus   Spastic hemiplegia affecting nondominant side   Severe sepsis with acute organ dysfunction   Lactic acidosis   Blood poisoning   Supratherapeutic INR    Time spent: 35 min    [facility]  [facility] Pager [phone]. If 7PM-7AM, please contact night-coverage at [email or web address], password [facility] 12/07/2014, 11:28 AM  LOS: 4 days

## 2014-12-07 NOTE — Consult Note (Signed)
Came to visit patient at bedside to discuss [place] [facility] services again. She was active with [facility] but was recently discharged due to goals being met. However, patient has ongoing needs. Consents signed. Spoke with inpatient RNCM about patient. Patient to have home health as well. These services will not interfere with one another. Patient reports she still does not have [place] and it appears, after reviewing with inpatient RNCM, that patient does not have Medicaid either as secondary. [facility] to follow up post discharge. [facility] Care Management packet left at bedside along with contact information.  Marthenia Rolling, MSN- [facility] [phone]

## 2014-12-08 ENCOUNTER — Encounter (HOSPITAL_COMMUNITY): Payer: Self-pay | Admitting: Cardiovascular Disease

## 2014-12-08 ENCOUNTER — Encounter (HOSPITAL_COMMUNITY)
Admission: EM | Disposition: A | Discharge status: Home Health | Payer: Medicare HMO | Source: Home / Self Care | Attending: Internal Medicine

## 2014-12-08 DIAGNOSIS — I2510 Atherosclerotic heart disease of native coronary artery without angina pectoris: Secondary | ICD-10-CM

## 2014-12-08 DIAGNOSIS — I214 Non-ST elevation (NSTEMI) myocardial infarction: Secondary | ICD-10-CM | POA: Diagnosis present

## 2014-12-08 DIAGNOSIS — D649 Anemia, unspecified: Secondary | ICD-10-CM

## 2014-12-08 HISTORY — PX: LEFT HEART CATHETERIZATION WITH CORONARY ANGIOGRAM: SHX5451

## 2014-12-08 HISTORY — PX: CARDIAC CATHETERIZATION: SHX172

## 2014-12-08 LAB — CBC
HCT: 28.2 % — ABNORMAL LOW (ref 36.0–46.0)
HEMOGLOBIN: 8.9 g/dL — AB (ref 12.0–15.0)
MCH: 27.8 pg (ref 26.0–34.0)
MCHC: 31.6 g/dL (ref 30.0–36.0)
MCV: 88.1 fL (ref 78.0–100.0)
Platelets: 197 10*3/uL (ref 150–400)
RBC: 3.2 MIL/uL — AB (ref 3.87–5.11)
RDW: 17.6 % — ABNORMAL HIGH (ref 11.5–15.5)
WBC: 5.7 10*3/uL (ref 4.0–10.5)

## 2014-12-08 LAB — PROTIME-INR
INR: 1.15 (ref 0.00–1.49)
PROTHROMBIN TIME: 14.8 s (ref 11.6–15.2)

## 2014-12-08 LAB — COMPREHENSIVE METABOLIC PANEL
ALT: 51 U/L — AB (ref 0–35)
AST: 32 U/L (ref 0–37)
Albumin: 2.6 g/dL — ABNORMAL LOW (ref 3.5–5.2)
Alkaline Phosphatase: 84 U/L (ref 39–117)
Anion gap: 5 (ref 5–15)
BUN: 8 mg/dL (ref 6–23)
CALCIUM: 8.3 mg/dL — AB (ref 8.4–10.5)
CO2: 26 mmol/L (ref 19–32)
Chloride: 110 mEq/L (ref 96–112)
Creatinine, Ser: 0.76 mg/dL (ref 0.50–1.10)
GFR calc Af Amer: >90 mL/min (ref >90)
GFR calc non Af Amer: >90 mL/min (ref >90)
Glucose, Bld: 131 mg/dL — ABNORMAL HIGH (ref 70–99)
Potassium: 3.7 mmol/L (ref 3.5–5.1)
SODIUM: 141 mmol/L (ref 135–145)
TOTAL PROTEIN: 5.5 g/dL — AB (ref 6.0–8.3)
Total Bilirubin: 0.4 mg/dL (ref 0.3–1.2)

## 2014-12-08 LAB — HEMOGLOBIN A1C
HEMOGLOBIN A1C: 9 % — AB (ref <5.7)
Mean Plasma Glucose: 212 mg/dL — ABNORMAL HIGH (ref <117)

## 2014-12-08 LAB — GLUCOSE, CAPILLARY
GLUCOSE-CAPILLARY: 180 mg/dL — AB (ref 70–99)
Glucose-Capillary: 151 mg/dL — ABNORMAL HIGH (ref 70–99)
Glucose-Capillary: 218 mg/dL — ABNORMAL HIGH (ref 70–99)
Glucose-Capillary: 156 mg/dL — ABNORMAL HIGH (ref 70–99)

## 2014-12-08 SURGERY — LEFT HEART CATHETERIZATION WITH CORONARY ANGIOGRAM
Anesthesia: LOCAL

## 2014-12-08 MED ORDER — LIDOCAINE HCL (PF) 1 % IJ SOLN
INTRAMUSCULAR | Status: AC
  Filled 2014-12-08: qty 30

## 2014-12-08 MED ORDER — CLOPIDOGREL BISULFATE 75 MG PO TABS
75.0000 mg | ORAL_TABLET | Freq: Every day | ORAL | Status: DC
Start: 2014-12-09 — End: 2014-12-15
  Administered 2014-12-09 – 2014-12-15 (×7): 75 mg via ORAL
  Filled 2014-12-08 (×8): qty 1

## 2014-12-08 MED ORDER — NITROGLYCERIN 1 MG/10 ML FOR IR/CATH LAB
INTRA_ARTERIAL | Status: AC
  Filled 2014-12-08: qty 10

## 2014-12-08 MED ORDER — ASPIRIN 81 MG PO CHEW
81.0000 mg | CHEWABLE_TABLET | ORAL | Status: AC
  Administered 2014-12-08: 81 mg via ORAL
  Filled 2014-12-08: qty 1

## 2014-12-08 MED ORDER — SODIUM CHLORIDE 0.9 % IJ SOLN: 3.0000 mL | Freq: Two times a day (BID) | INTRAMUSCULAR | Status: DC

## 2014-12-08 MED ORDER — INSULIN DETEMIR 100 UNIT/ML ~~LOC~~ SOLN
10.0000 [IU] | Freq: Once | SUBCUTANEOUS | Status: AC
  Administered 2014-12-08: 10 [IU] via SUBCUTANEOUS
  Filled 2014-12-08: qty 0.1

## 2014-12-08 MED ORDER — ASPIRIN 81 MG PO CHEW
81.0000 mg | CHEWABLE_TABLET | Freq: Every day | ORAL | Status: DC
  Administered 2014-12-10 – 2014-12-15 (×6): 81 mg via ORAL
  Filled 2014-12-08 (×7): qty 1

## 2014-12-08 MED ORDER — HEPARIN (PORCINE) IN NACL 2-0.9 UNIT/ML-% IJ SOLN
INTRAMUSCULAR | Status: AC
Start: 2014-12-08 — End: 2014-12-08
  Filled 2014-12-08: qty 1000

## 2014-12-08 MED ORDER — CLOPIDOGREL BISULFATE 300 MG PO TABS
ORAL_TABLET | ORAL | Status: AC
  Filled 2014-12-08: qty 1

## 2014-12-08 MED ORDER — ATORVASTATIN CALCIUM 80 MG PO TABS
80.0000 mg | ORAL_TABLET | Freq: Every day | ORAL | Status: DC
  Administered 2014-12-08 – 2014-12-14 (×7): 80 mg via ORAL
  Filled 2014-12-08 (×8): qty 1

## 2014-12-08 MED ORDER — HEPARIN (PORCINE) IN NACL 100-0.45 UNIT/ML-% IJ SOLN
1000.0000 [IU]/h | INTRAMUSCULAR | Status: DC
  Administered 2014-12-08: 1000 [IU]/h via INTRAVENOUS
  Filled 2014-12-08: qty 250

## 2014-12-08 MED ORDER — BIVALIRUDIN 250 MG IV SOLR
INTRAVENOUS | Status: AC
  Filled 2014-12-08: qty 250

## 2014-12-08 MED ORDER — SODIUM CHLORIDE 0.9 % IV SOLN
INTRAVENOUS | Status: AC
  Administered 2014-12-08: 22:00:00 via INTRAVENOUS

## 2014-12-08 MED ORDER — ACETAMINOPHEN 325 MG PO TABS
650.0000 mg | ORAL_TABLET | ORAL | Status: DC | PRN
  Administered 2014-12-08: 23:00:00 650 mg via ORAL
  Filled 2014-12-08: qty 2

## 2014-12-08 MED ORDER — SODIUM CHLORIDE 0.9 % IV SOLN: 1.0000 mL/kg/h | INTRAVENOUS | Status: DC

## 2014-12-08 MED ORDER — ASPIRIN 81 MG PO CHEW: 81.0000 mg | CHEWABLE_TABLET | ORAL | Status: DC

## 2014-12-08 MED ORDER — SODIUM CHLORIDE 0.9 % IV SOLN: 250.0000 mL | INTRAVENOUS | Status: DC | PRN

## 2014-12-08 MED ORDER — VERAPAMIL HCL 2.5 MG/ML IV SOLN
INTRAVENOUS | Status: AC
  Filled 2014-12-08: qty 2

## 2014-12-08 MED ORDER — SODIUM CHLORIDE 0.9 % IJ SOLN: 3.0000 mL | INTRAMUSCULAR | Status: DC | PRN

## 2014-12-08 MED ORDER — MIDAZOLAM HCL 2 MG/2ML IJ SOLN
INTRAMUSCULAR | Status: AC
  Filled 2014-12-08: qty 2

## 2014-12-08 MED ORDER — CLOPIDOGREL BISULFATE 300 MG PO TABS
ORAL_TABLET | ORAL | Status: AC
Start: 2014-12-08 — End: 2014-12-08
  Filled 2014-12-08: qty 1

## 2014-12-08 MED ORDER — FENTANYL CITRATE 0.05 MG/ML IJ SOLN
INTRAMUSCULAR | Status: AC
  Filled 2014-12-08: qty 2

## 2014-12-08 NOTE — Progress Notes (Signed)
MD made aware of pt being scheduled for cardiac cath and if it was ok for pt to have AM meds. MD said to hold novolog insulin, give half the dose of levemir and administer the rest of AM PO meds. Vwilliams,rn.

## 2014-12-08 NOTE — Progress Notes (Signed)
Pt transferred to [facility]. Left unit on stretcher with [facility]. Left in good condition. Vwilliams,rn.

## 2014-12-08 NOTE — Progress Notes (Signed)
ANTICOAGULATION CONSULT NOTE - Initial Consult  Pharmacy Consult for heparin Indication: mechanical aortic valve  Allergies  Allergen Reactions  . Celebrex [Celecoxib] Rash  . Detrol [Tolterodine] Hives    Patient Measurements: Height: 5\' 3"  (160 cm) Weight: 175 lb 4.3 oz (79.5 kg) IBW/kg (Calculated) : 52.4 Heparin Dosing Weight: 69kg  Vital Signs: Temp: 98.8 F (37.1 C) (01/20 1526) Temp Source: Oral (01/20 1526) BP: 145/94 mmHg (01/20 1526) Pulse Rate: 71 (01/20 1526)  Labs:  Recent Labs  12/05/14 1752  12/06/14 0525 12/07/14 0500 12/08/14 0449  HGB  --   < > 8.2* 9.1* 8.9*  HCT  --   --  25.1* 28.2* 28.2*  PLT  --   --  171 177 197  LABPROT  --   --  50.3* 21.3* 14.8  INR  --   --  5.48* 1.83* 1.15  CREATININE  --   --  0.77 0.75 0.76  TROPONINI 5.34*  --   --   --   --   < > = values in this interval not displayed.  Estimated Creatinine Clearance: 81.1 mL/min (by C-G formula based on Cr of 0.76).   Medical History: Past Medical History  Diagnosis Date  . CAD (coronary artery disease)   . Obesity   . Hypercholesteremia   . HTN (hypertension)   . Dyslipidemia   . Stroke syndrome   . Aortic stenosis   . Thrombophlebitis   . Diabetes   . Hyperlipidemia   . Rheumatoid arthritis(714.0)     Assessment: 18 YOF transferred from [facility] to [facility] for cath- patient now s/p cath and to start heparin 8 hours post sheath removal for mechanical aortic valve. Of note, patient was on Lovenox and not a heparin infusion prior to cath. Sheath was removed at 1500 and a TR band was placed. Hgb is low but stable, plts are WNL. No excessive bleeding noted during cath.  Goal of Therapy:  Heparin level 0.3-0.7 units/ml Monitor platelets by anticoagulation protocol: Yes   Plan:  -at 2300 tonight, start heparin infusion at 1000 units/hr (~14units/kg/hr). Will ensure there were no bleeding issues with TR band prior to start of heparin infusion -daily heparin  levels and CBC- first heparin level with 1/21's AM labs -follow for s/s bleeding -follow for long term anticoagulation plans  Claudie Rathbone D. Mineola Duan, PharmD, BCPS Clinical Pharmacist Pager: [phone] 12/08/2014 4:29 PM

## 2014-12-08 NOTE — CV Procedure (Signed)
   Cardiac Catheterization Procedure Note  Name: Madison [name] MRN: [id number] DOB: 01-Jul-1961  Procedure: Selective Coronary Angiography  Indication: Non-ST elevation myocardial infarction  Medications:  Sedation:  2 mg IV Versed, 100 mcg IV Fentanyl  Contrast:  70 mL Omnipaque   Procedural Details: The right wrist was prepped, draped, and anesthetized with 1% lidocaine. Using the modified Seldinger technique, a 5 slender [place] sheath was introduced into the right radial artery. 3 mg of verapamil was administered through the sheath, weight-based unfractionated heparin was administered intravenously. I could not advance the sheath all the way due to small radial artery. This is in spite of given 2 doses of verapamil as well as nitroglycerin. A Jackie catheter was used for selective coronary angiography.   The plan was to proceed with LAD PCI. Angiomax was initiated. However, I could not advance the guiding catheter beyond the elbow in spite of having the wire in good position. I decided to abort the procedure and plan LAD PCI via the femoral artery. There were no immediate procedural complications. A TR band was used for radial hemostasis at the completion of the procedure.  The patient was transferred to the post catheterization recovery area for further monitoring.  Procedural Findings:  Hemodynamics: AO:  115/70   mmHg  Coronary angiography: Coronary dominance: Right   Left Main:  Normal  Left Anterior Descending (LAD):  Normal in size with diffuse 30% disease proximally. There is 90% tubular stenosis in the midsegment. The rest of the vessel has minor irregularities.  1st diagonal (D1):  Normal in size with 20% ostial stenosis.  2nd diagonal (D2):  Very small in size.  3rd diagonal (D3):  Very small in size.  Circumflex (LCx):  Normal in size and nondominant. There is 30% tubular stenosis in the midsegment.  1st obtuse marginal:  Very small in size.  2nd obtuse marginal:   Very small in size.  3rd obtuse marginal:  Normal in size with no significant disease.    Ramus Intermedius:  Large in size with minor irregularities.  Right Coronary Artery: Normal in size and dominant. There is 20% proximal stenosis. There is 40% tubular stenosis in the midsegment. The rest of the vessel has minor irregularities.  Posterior descending artery: Minor irregularities.  Posterior AV segment: Minor irregularities.  Posterolateral branchs:  Minor irregularities.  Left ventriculography: Was not performed. The patient has a mechanical aortic valve   Final Conclusions:    1. Severe one-vessel coronary artery disease with 90% stenosis involving the mid LAD. 2. Inability to perform PCI via the right radial artery due to small radial artery size and inability to advance the guiding catheter.  Recommendations:  Mid LAD PCI via the femoral artery tomorrow. Resume heparin 8 hour after sheath pull. Continue to hold warfarin. The patient was already loaded with 600 mg of Plavix. After PCI, consider using warfarin and Plavix without aspirin.  Lorine Bears MD, [facility] 12/08/2014, 3:16 PM

## 2014-12-08 NOTE — Progress Notes (Signed)
Left side is weaker than  Right side

## 2014-12-08 NOTE — Progress Notes (Addendum)
[facility] PROGRESS NOTE  Madison [name] OFB:[id number] DOB: 26-May-1961 DOA: 12/03/2014 PCP: Janell Quiet, FNP  Interim summary Patient is a 54 year old female with a past medical history of type 1 diabetes mellitus, history of aortic valve replacement, hypertension, dyslipidemia, history of CVA who was admitted to the medicine service on 12/03/2014. She presented to the emergency room with mental status changes, having initial blood pressure of 80/66. Initial workup revealed diabetic ketoacidosis having an anion gap of 29 with bicarbonate of 8 and a glucose of 1047. Her white count was 30,000. Patient likely to be septic although did not have an obvious source of infection. Urinalysis was negative for nitrates and leukocyte esterase, chest x-ray did not reveal obvious infiltrate. She was covered with empiric IV antimicrobial therapy with IV vancomycin and Zosyn. Pulmonary critical care medicine was consulted as she was admitted to the intensive care unit. Her troponins increased to 5.63 for which Cardiology was consulted. Troponin elevation felt to be secondary to demand ischemia. Patient transferred to [facility] for heart cath and anticipated to be discharged from [facility] once INR therapeutic. In [place] to diabetic ketoacidosis she was transitioned to basal insulin on 12/04/2014 with the discontinuation of IV insulin. Patient tolerating by mouth intake.   Assessment/Plan: 1. Sepsis-resolved,Hypovolemic Shock 2/2 Dehydration, resolved White blood cell count on admission 30,000, pulse of 119, respiratory rate of 25, hypotension with blood pressure of 79/26.  -Workup thus far has not shown source of infection, blood cultures drawn 12/03/2014 showing no growth to date. -She does have a history of aortic valve replacement and cardiology following -A transthoracic echocardiogram performed on 12/04/2014 showing ejection fraction of 45-50% with moderate hypokinesis of the mid apical  anteroseptal myocardium. Vegetations not seen on 2-D echo.  Discontinued vancomycin 1/18 Discontinue Zosyn 1/19 2 chest x-rays negative, UA negative Blood culture urine culture no growth so far   2.  Diabetic ketoacidosis-resolved -I suspect underlying infectious process precipitating DKA -DKA resolving by 12/04/2014 as her IV insulin was discontinued and transitioned to subcutaneous Levemir. -Blood sugars remain stable Decrease Levemir 20 units subcutaneous daily , received 10 units only prior to cath as the patient was npo Started on pre-meal NovoLog Hemoglobin A1c 9.0    3.  Suspected demand ischemia,Possible Takotsubo syndrome -Patient presenting with hypotension, sepsis/critical illness. Her troponins peaked at 15.04 on 10/05/2015 -Cardiology consulted, patient will need a cardiac cath, received 1 dose of by mouth vitamin K 1/17 administered by cardiology. Anticipate cardiac cath today 2-D echo 12/04/2014 that showed an ejection fraction 45-50% with moderate hypokinesis Patient's Coumadin needs to be restarted after cardiac cath Lovenox held 1/19 in anticipation of cardiac cath today    4.  Supratherapeutic INR upon admission (6.23) Now anticoagulation is on hold, resume Coumadin and Lovenox after cardiac cath -Patient anticoagulated for history of mechanical aortic valve replacement -Pharmacy consulted for warfarin management, initiation of Lovenox for INR less than 2  .  5.  Aortic valve replacement -Pharmacy consult for warfarin management, Lovenox management   6.  Hypokalemia -Replaced  7.  Acute kidney injury-resolved -Secondary to sepsis and hypovolemia as she initially presented with a creatinine of 2.06, now 0.76 -Resolve with administration of IV fluids  Code Status: Full code Family Communication:  Disposition Plan: Cardiac cath 1/20, home health PT   Consultants:  Pulmonary critical care  medicine  Cardiology    Antibiotics:  Vancomycin  Zosyn  HPI/Subjective: Patient is awake, alert, states feeling little better today. Hypoglycemic last night.  She is tolerating by mouth intake. She presently denies chest pain, shortness of breath, nausea, vomiting.  Objective: Filed Vitals:   12/08/14 0905  BP: 113/46  Pulse:   Temp:   Resp:     Intake/Output Summary (Last 24 hours) at 12/08/14 1052 Last data filed at 12/08/14 0900  Gross per 24 hour  Intake    290 ml  Output    600 ml  Net   -310 ml   Filed Weights   12/04/14 1351  Weight: 79.5 kg (175 lb 4.3 oz)    Exam:   General:  She appears improved on this mornings evaluation, awake, alert  Cardiovascular: Tachycardic, regular rate rhythm normal S1-S2  Respiratory: Lungs overall are clear to auscultation bilaterally, normal respiratory effort, no wheezing rhonchi or rales  Abdomen: Soft nontender nondistended  Musculoskeletal: Patient having hematoma over right lower back region, no edema  Data Reviewed: Basic Metabolic Panel:  Recent Labs Lab 12/04/14 0515  12/04/14 1630  12/04/14 2336 12/05/14 0312 12/05/14 0445 12/06/14 0525 12/07/14 0500 12/08/14 0449  NA  --   < > 136  < > 140 139  --  147* 143 141  K  --   < > 4.1  < > 3.7 3.5  --  3.9 4.0 3.7  CL  --   < > 113*  < > 120* 119*  --  117* 114* 110  CO2  --   < > 16*  < > 17* 19  --  25 25 26   GLUCOSE  --   < > 148*  < > 114* 112*  --  75 107* 131*  BUN  --   < > 20  < > 17 15  --  9 7 8   CREATININE  --   < > 1.03  < > 1.04 1.00  --  0.77 0.75 0.76  CALCIUM  --   < > 7.1*  < > 7.4* 7.4*  --  7.8* 8.2* 8.3*  MG 1.5  --  2.0  --   --   --  2.2  --   --   --   PHOS 1.2*  --  2.9  --   --   --  2.6  --   --   --   < > = values in this interval not displayed. Liver Function Tests:  Recent Labs Lab 12/03/14 1814 12/05/14 0445 12/07/14 0500 12/08/14 0449  AST 26 83* 90* 32  ALT 16 39* 74* 51*  ALKPHOS 99 61 90 84  BILITOT 1.4* 0.6  0.7 0.4  PROT 6.7 4.8* 5.5* 5.5*  ALBUMIN 3.9 2.6* 2.8* 2.6*   No results for input(s): LIPASE, AMYLASE in the last 168 hours. No results for input(s): AMMONIA in the last 168 hours. CBC:  Recent Labs Lab 12/03/14 1618 12/05/14 0445 12/06/14 0525 12/07/14 0500 12/08/14 0449  WBC 30.0* 16.8* 7.3 6.1 5.7  NEUTROABS 27.0*  --   --   --   --   HGB 10.9* 8.8* 8.2* 9.1* 8.9*  HCT 35.1* 26.4* 25.1* 28.2* 28.2*  MCV 89.1 83.3 86.0 86.8 88.1  PLT 407* 261 171 177 197   Cardiac Enzymes:  Recent Labs Lab 12/04/14 1800 12/04/14 2336 12/05/14 0445 12/05/14 1150 12/05/14 1752  TROPONINI 10.71* 15.04* 11.13* 6.55* 5.34*   BNP (last 3 results)  Recent Labs  09/30/14 1431  PROBNP 794.3*   CBG:  Recent Labs Lab 12/07/14 1146 12/07/14 1724 12/07/14 1822 12/07/14 2133 12/08/14 0741  GLUCAP 134* 61* 98 201* 151*    Recent Results (from the past 240 hour(s))  Culture, blood (routine x 2)     Status: None (Preliminary result)   Collection Time: 12/03/14  4:18 PM  Result Value Ref Range Status   Specimen Description BLOOD LEFT WRIST  Final   Special Requests BOTTLES DRAWN AEROBIC ONLY  Final   Culture   Final           BLOOD CULTURE RECEIVED NO GROWTH TO DATE CULTURE WILL BE HELD FOR 5 DAYS BEFORE ISSUING A FINAL NEGATIVE REPORT Performed at [facility]    Report Status PENDING  Incomplete  Culture, blood (routine x 2)     Status: None (Preliminary result)   Collection Time: 12/03/14  4:20 PM  Result Value Ref Range Status   Specimen Description BLOOD RIGHT WRIST  Final   Special Requests BOTTLES DRAWN AEROBIC AND ANAEROBIC 1.5ML  Final   Culture   Final           BLOOD CULTURE RECEIVED NO GROWTH TO DATE CULTURE WILL BE HELD FOR 5 DAYS BEFORE ISSUING A FINAL NEGATIVE REPORT Performed at [facility]    Report Status PENDING  Incomplete  MRSA PCR Screening     Status: None   Collection Time: 12/03/14 11:41 PM  Result Value Ref Range Status    MRSA by PCR NEGATIVE NEGATIVE Final    Comment:        The GeneXpert MRSA Assay (FDA approved for NASAL specimens only), is one component of a comprehensive MRSA colonization surveillance program. It is not intended to diagnose MRSA infection nor to guide or monitor treatment for MRSA infections.      Studies: No results found.  Scheduled Meds: . antiseptic oral rinse  7 mL Mouth Rinse q12n4p  . [START ON 12/09/2014] aspirin  81 mg Oral Daily  . chlorhexidine  15 mL Mouth Rinse BID  . darifenacin  7.5 mg Oral Daily  . feeding supplement (GLUCERNA SHAKE)  237 mL Oral BID BM  . insulin aspart  0-15 Units Subcutaneous TID WC  . insulin aspart  3 Units Subcutaneous TID WC  . insulin detemir  20 Units Subcutaneous Daily  . metoprolol tartrate  12.5 mg Oral BID  . pantoprazole  40 mg Oral Daily  . piperacillin-tazobactam (ZOSYN)  IV  3.375 g Intravenous Q8H  . sodium chloride  10-40 mL Intracatheter Q12H  . sodium chloride  3 mL Intravenous Q12H   Continuous Infusions: . sodium chloride 1 mL/kg/hr (12/08/14 0509)    Active Problems:   CAD (coronary artery disease)   HTN (hypertension)   Rheumatoid arthritis   S/P AVR   Type 1 diabetes mellitus with neurological manifestations   DKA (diabetic ketoacidoses)   Diabetic ketoacidosis without coma associated with type 1 diabetes mellitus   Spastic hemiplegia affecting nondominant side   Severe sepsis with acute organ dysfunction   Lactic acidosis   Blood poisoning   Supratherapeutic INR    Time spent: 35 min    [facility]  [facility] Pager [phone]. If 7PM-7AM, please contact night-coverage at [email or web address], password [facility] 12/08/2014, 10:52 AM  LOS: 5 days

## 2014-12-08 NOTE — Progress Notes (Signed)
Subjective:  She feels well this morning.  No shortness of breath or chest pain.      Objective:  Vital Signs in the last 24 hours: BP 113/46 mmHg  Pulse 66  Temp(Src) 97.7 F (36.5 C) (Oral)  Resp 18  Ht 5' 3" (1.6 m)  Wt 175 lb 4.3 oz (79.5 kg)  BMI 31.05 kg/m2  SpO2 95%  Physical Exam: Pleasant white female in no acute distress, mildly dysarthric. Lungs:  Clear Cardiac:  Regular rhythm, normal S1 and S2, no S3, 1 to 2/6 systolic murmur Extremities:  No edema present  Intake/Output from previous day: 01/19 0701 - 01/20 0700 In: 630 [P.O.:240; I.V.:340; IV Piggyback:50] Out: 600 [Urine:600]  Weight Filed Weights   12/04/14 1351  Weight: 175 lb 4.3 oz (79.5 kg)    Lab Results: Basic Metabolic Panel:  Recent Labs  12/07/14 0500 12/08/14 0449  NA 143 141  K 4.0 3.7  CL 114* 110  CO2 25 26  GLUCOSE 107* 131*  BUN 7 8  CREATININE 0.75 0.76   CBC:  Recent Labs  12/07/14 0500 12/08/14 0449  WBC 6.1 5.7  HGB 9.1* 8.9*  HCT 28.2* 28.2*  MCV 86.8 88.1  PLT 177 197   Cardiac Enzymes:  Recent Labs  12/05/14 1150 12/05/14 1752  TROPONINI 6.55* 5.34*    Telemetry: Sinus rhythm  Echo: Study Conclusions  - Left ventricle: The cavity size was normal. There was mild focal basal and severe concentric hypertrophy of the septum. Systolic function was mildly reduced. The estimated ejection fraction was in the range of 45% to 50%. There is moderate hypokinesis of the mid-apicalanteroseptal myocardium. Doppler parameters are consistent with high ventricular filling pressure. - Aortic valve: A mechanical prosthesis was present and functioning normally. Valve area (VTI): 1.35 cm^2. Valve area (Vmax): 1.21 cm^2. - Mitral valve: Calcified annulus. Mildly thickened leaflets . There was mild regurgitation. - Pulmonary arteries: PA peak pressure: 31 mm Hg (S).  Impressions:  - There was no evidence of a vegetation.  Ecg: NSR, low voltage. T  wave inversion anteriorly.  INR 1.83 today.  Assessment/Plan:  1.  Non-ST elevation myocardial infarction versus demand ischemia in the setting of severe DKA and possible sepsis. Possible Takotsubo syndrome.  2.  Sepsis- resolved. Antibiotics per primary team. 3.  Diabetes with complications/ketoacidosis. 4.  Previous prosthetic mechanical aortic valve 5. Anemia  Plan cardiac cath today INR normalized. Warfarin on hold for procedure. Will need to resume coumadin post procedure with bridging Lovenox.  Anticipate she will stay at [facility] post cath.     Peter Jordan MD, FACC  12/08/2014, 9:27 AM    

## 2014-12-08 NOTE — Progress Notes (Signed)
Inpatient Diabetes Program Recommendations  AACE/ADA: New Consensus Statement on Inpatient Glycemic Control (2013)  Target Ranges:  Prepandial:   less than 140 mg/dL      Peak postprandial:   less than 180 mg/dL (1-2 hours)      Critically ill patients:  140 - 180 mg/dL     Results for ANAM, BOBBY (MRN [id number]) as of 12/08/2014 10:42  Ref. Range 12/07/2014 03:43 12/07/2014 04:04 12/07/2014 04:37 12/07/2014 07:28 12/07/2014 11:46 12/07/2014 17:24 12/07/2014 18:22 12/07/2014 21:33  Glucose-Capillary Latest Range: 70-99 mg/dL 37 (LL) 33 (LL) 85 017 (H) 134 (H) 61 (L) 98 201 (H)    Results for NINOSKA, GOSWICK (MRN [id number]) as of 12/08/2014 10:42  Ref. Range 12/08/2014 07:41  Glucose-Capillary Latest Range: 70-99 mg/dL 163 (H)     Current Orders: Levemir 20 units daily in the AM      Novolog 3 units tidwc     Novolog Moderate SSI   **Note patient had severe Hypoglycemia yesterday AM.  Levemir reduced to 20 units daily as a result.    **Note patient received 25 units Levemir yesterday AM since Levemir dose was not reduced until after AM dose was due.    **Patient only received 10 units Levemir this AM since she is NPO for cardiac cath today.  **Patient will need less Levemir insulin at home (was taking 50 units QAM at home).  Patient also requesting Rx for Novolog SSI at time of d/c.     MD- Please decrease Novolog SSI to Sensitive scale tid ac + HS  (currently ordered as Moderate scale)     Will follow Ambrose Finland RN, MSN, CDE Diabetes Coordinator Inpatient Diabetes Program Team Pager: [phone] (8a-10p)

## 2014-12-08 NOTE — Progress Notes (Signed)
PT Cancellation Note  Patient Details Name: Madison [name] MRN: [id number] DOB: 01-31-1961   Cancelled Treatment:    Reason Eval/Treat Not Completed: Patient at procedure or test/unavailable. Pt at [facility] for cardiac cath. Will follow.    Ralene Bathe Kistler 12/08/2014, 12:41 PM [phone]

## 2014-12-08 NOTE — Progress Notes (Signed)
Received pt from [facility] via [facility].  Pt alert and denying any pain at this time. Noted a large bruise on right upper arm  Pt did not know how it happen.  No pain at site or skin tears.  IV infusing thru central line and left shoulder IV was flushed and saline locked.  Pt on monitor and VS was 147/75, 58 HR, 99% RA, and resp 22.  Consent signed for LCP.

## 2014-12-09 ENCOUNTER — Encounter (HOSPITAL_COMMUNITY): Payer: Self-pay | Admitting: Cardiology

## 2014-12-09 ENCOUNTER — Telehealth: Payer: Self-pay | Admitting: Family

## 2014-12-09 ENCOUNTER — Encounter (HOSPITAL_COMMUNITY)
Admission: EM | Disposition: A | Discharge status: Home Health | Payer: Medicare HMO | Source: Home / Self Care | Attending: Internal Medicine

## 2014-12-09 DIAGNOSIS — R778 Other specified abnormalities of plasma proteins: Secondary | ICD-10-CM | POA: Insufficient documentation

## 2014-12-09 DIAGNOSIS — E876 Hypokalemia: Secondary | ICD-10-CM

## 2014-12-09 DIAGNOSIS — I248 Other forms of acute ischemic heart disease: Secondary | ICD-10-CM | POA: Insufficient documentation

## 2014-12-09 DIAGNOSIS — Z952 Presence of prosthetic heart valve: Secondary | ICD-10-CM | POA: Diagnosis present

## 2014-12-09 DIAGNOSIS — N179 Acute kidney failure, unspecified: Secondary | ICD-10-CM | POA: Insufficient documentation

## 2014-12-09 DIAGNOSIS — I2510 Atherosclerotic heart disease of native coronary artery without angina pectoris: Secondary | ICD-10-CM

## 2014-12-09 DIAGNOSIS — I214 Non-ST elevation (NSTEMI) myocardial infarction: Secondary | ICD-10-CM

## 2014-12-09 DIAGNOSIS — E1065 Type 1 diabetes mellitus with hyperglycemia: Secondary | ICD-10-CM | POA: Diagnosis present

## 2014-12-09 DIAGNOSIS — IMO0002 Reserved for concepts with insufficient information to code with codable children: Secondary | ICD-10-CM | POA: Diagnosis present

## 2014-12-09 DIAGNOSIS — I5023 Acute on chronic systolic (congestive) heart failure: Secondary | ICD-10-CM

## 2014-12-09 DIAGNOSIS — R7989 Other specified abnormal findings of blood chemistry: Secondary | ICD-10-CM | POA: Insufficient documentation

## 2014-12-09 DIAGNOSIS — A4189 Other specified sepsis: Secondary | ICD-10-CM

## 2014-12-09 HISTORY — PX: CORONARY ANGIOPLASTY WITH STENT PLACEMENT: SHX49

## 2014-12-09 HISTORY — PX: PERCUTANEOUS CORONARY STENT INTERVENTION (PCI-S): SHX5485

## 2014-12-09 LAB — CULTURE, BLOOD (ROUTINE X 2)
CULTURE: NO GROWTH
Culture: NO GROWTH

## 2014-12-09 LAB — CBC
HEMATOCRIT: 28.6 % — AB (ref 36.0–46.0)
HEMOGLOBIN: 9.3 g/dL — AB (ref 12.0–15.0)
MCH: 27.8 pg (ref 26.0–34.0)
MCHC: 32.5 g/dL (ref 30.0–36.0)
MCV: 85.4 fL (ref 78.0–100.0)
Platelets: 182 10*3/uL (ref 150–400)
RBC: 3.35 MIL/uL — AB (ref 3.87–5.11)
RDW: 17.4 % — AB (ref 11.5–15.5)
WBC: 6.6 10*3/uL (ref 4.0–10.5)

## 2014-12-09 LAB — GLUCOSE, CAPILLARY
GLUCOSE-CAPILLARY: 271 mg/dL — AB (ref 70–99)
GLUCOSE-CAPILLARY: 189 mg/dL — AB (ref 70–99)
Glucose-Capillary: 332 mg/dL — ABNORMAL HIGH (ref 70–99)
Glucose-Capillary: 299 mg/dL — ABNORMAL HIGH (ref 70–99)
Glucose-Capillary: 307 mg/dL — ABNORMAL HIGH (ref 70–99)
Glucose-Capillary: 181 mg/dL — ABNORMAL HIGH (ref 70–99)

## 2014-12-09 LAB — HEPARIN LEVEL (UNFRACTIONATED): Heparin Unfractionated: 0.23 IU/mL — ABNORMAL LOW (ref 0.30–0.70)

## 2014-12-09 LAB — BASIC METABOLIC PANEL
Anion gap: 6 (ref 5–15)
CHLORIDE: 108 meq/L (ref 96–112)
CO2: 24 mmol/L (ref 19–32)
CREATININE: 0.79 mg/dL (ref 0.50–1.10)
Calcium: 8.3 mg/dL — ABNORMAL LOW (ref 8.4–10.5)
Glucose, Bld: 294 mg/dL — ABNORMAL HIGH (ref 70–99)
POTASSIUM: 4.1 mmol/L (ref 3.5–5.1)
Sodium: 138 mmol/L (ref 135–145)

## 2014-12-09 LAB — POCT ACTIVATED CLOTTING TIME: Activated Clotting Time: 392 seconds

## 2014-12-09 LAB — PLATELET INHIBITION P2Y12: Platelet Function  P2Y12: 259 [PRU] (ref 194–418)

## 2014-12-09 SURGERY — PERCUTANEOUS CORONARY STENT INTERVENTION (PCI-S)

## 2014-12-09 MED ORDER — LIDOCAINE HCL (PF) 1 % IJ SOLN
INTRAMUSCULAR | Status: AC
  Filled 2014-12-09: qty 30

## 2014-12-09 MED ORDER — WARFARIN SODIUM 7.5 MG PO TABS
7.5000 mg | ORAL_TABLET | Freq: Once | ORAL | Status: AC
  Administered 2014-12-09: 17:00:00 7.5 mg via ORAL
  Filled 2014-12-09: qty 1

## 2014-12-09 MED ORDER — SODIUM CHLORIDE 0.9 % IV SOLN
INTRAVENOUS | Status: DC
  Administered 2014-12-09: 06:00:00 via INTRAVENOUS

## 2014-12-09 MED ORDER — SODIUM CHLORIDE 0.9 % IJ SOLN
3.0000 mL | Freq: Two times a day (BID) | INTRAMUSCULAR | Status: DC
  Administered 2014-12-09: 3 mL via INTRAVENOUS

## 2014-12-09 MED ORDER — HEPARIN (PORCINE) IN NACL 100-0.45 UNIT/ML-% IJ SOLN
1200.0000 [IU]/h | INTRAMUSCULAR | Status: DC
  Administered 2014-12-09: 1050 [IU]/h via INTRAVENOUS
  Filled 2014-12-09 (×2): qty 250

## 2014-12-09 MED ORDER — HEPARIN (PORCINE) IN NACL 2-0.9 UNIT/ML-% IJ SOLN
INTRAMUSCULAR | Status: AC
  Filled 2014-12-09: qty 1500

## 2014-12-09 MED ORDER — INSULIN ASPART 100 UNIT/ML ~~LOC~~ SOLN
4.0000 [IU] | Freq: Once | SUBCUTANEOUS | Status: AC
  Administered 2014-12-09: 08:00:00 4 [IU] via SUBCUTANEOUS

## 2014-12-09 MED ORDER — SODIUM CHLORIDE 0.9 % IV SOLN: 250.0000 mL | INTRAVENOUS | Status: DC | PRN

## 2014-12-09 MED ORDER — NITROGLYCERIN 1 MG/10 ML FOR IR/CATH LAB
INTRA_ARTERIAL | Status: AC
  Filled 2014-12-09: qty 10

## 2014-12-09 MED ORDER — MIDAZOLAM HCL 2 MG/2ML IJ SOLN
INTRAMUSCULAR | Status: AC
  Filled 2014-12-09: qty 2

## 2014-12-09 MED ORDER — SODIUM CHLORIDE 0.9 % IV SOLN: 1.0000 mL/kg/h | INTRAVENOUS | Status: AC

## 2014-12-09 MED ORDER — SODIUM CHLORIDE 0.9 % IJ SOLN: 3.0000 mL | INTRAMUSCULAR | Status: DC | PRN

## 2014-12-09 MED ORDER — BIVALIRUDIN 250 MG IV SOLR
INTRAVENOUS | Status: AC
  Filled 2014-12-09: qty 250

## 2014-12-09 MED ORDER — SODIUM CHLORIDE 0.9 % IJ SOLN: 3.0000 mL | Freq: Two times a day (BID) | INTRAMUSCULAR | Status: DC

## 2014-12-09 MED ORDER — HYDRALAZINE HCL 20 MG/ML IJ SOLN: 5.0000 mg | Freq: Four times a day (QID) | INTRAMUSCULAR | Status: DC | PRN

## 2014-12-09 MED ORDER — ASPIRIN 81 MG PO CHEW
CHEWABLE_TABLET | ORAL | Status: AC
  Filled 2014-12-09: qty 1

## 2014-12-09 MED ORDER — FENTANYL CITRATE 0.05 MG/ML IJ SOLN
INTRAMUSCULAR | Status: AC
  Filled 2014-12-09: qty 2

## 2014-12-09 MED ORDER — WARFARIN - PHARMACIST DOSING INPATIENT
Freq: Every day | Status: DC
  Administered 2014-12-09 – 2014-12-14 (×3)

## 2014-12-09 MED ORDER — INSULIN ASPART 100 UNIT/ML ~~LOC~~ SOLN
4.0000 [IU] | Freq: Once | SUBCUTANEOUS | Status: AC
  Administered 2014-12-09: 4 [IU] via SUBCUTANEOUS

## 2014-12-09 MED FILL — Sodium Chloride IV Soln 0.9%: INTRAVENOUS | Qty: 50 | Status: AC

## 2014-12-09 NOTE — Progress Notes (Signed)
ANTICOAGULATION CONSULT NOTE - Follow Up Consult  Pharmacy Consult for heparin Indication: AVR  Labs:  Recent Labs  12/06/14 0525 12/07/14 0500 12/08/14 0449 12/09/14 0342  HGB 8.2* 9.1* 8.9* 9.3*  HCT 25.1* 28.2* 28.2* 28.6*  PLT 171 177 197 182  LABPROT 50.3* 21.3* 14.8  --   INR 5.48* 1.83* 1.15  --   HEPARINUNFRC  --   --   --  0.23*  CREATININE 0.77 0.75 0.76  --     Assessment/Plan:  54yo female subtherapeutic on heparin with initial dosing post-cath though lab was drawn early and may need more time to accumulate. Will continue gtt at current rate and check additional level.   Vernard Gambles, PharmD, BCPS  12/09/2014,4:13 AM

## 2014-12-09 NOTE — Progress Notes (Signed)
ANTICOAGULATION CONSULT NOTE - Follow Up Consult  Pharmacy Consult for heparin and warfarin Indication: mechanical AVR  Allergies  Allergen Reactions  . Celebrex [Celecoxib] Rash  . Detrol [Tolterodine] Hives    Patient Measurements: Height: 5\' 3"  (160 cm) Weight: 179 lb 14.3 oz (81.6 kg) IBW/kg (Calculated) : 52.4 Heparin Dosing Weight: 69 kg  Vital Signs: Temp: 98.2 F (36.8 C) (01/21 0713) Temp Source: Oral (01/21 0713) BP: 137/45 mmHg (01/21 0713) Pulse Rate: 74 (01/21 0802)  Labs:  Recent Labs  12/07/14 0500 12/08/14 0449 12/09/14 0342  HGB 9.1* 8.9* 9.3*  HCT 28.2* 28.2* 28.6*  PLT 177 197 182  LABPROT 21.3* 14.8  --   INR 1.83* 1.15  --   HEPARINUNFRC  --   --  0.23*  CREATININE 0.75 0.76 0.79    Estimated Creatinine Clearance: 82.3 mL/min (by C-G formula based on Cr of 0.79).   Medications:  Infusions:  . sodium chloride 75 mL/hr at 12/09/14 0612  . sodium chloride      Assessment: 54 y/o female with mechanical AVR transferred from [facility] to [facility] for cath. Severe one vessel CAD found but unable to perform PCI during first cath due to radial access. She went back to cath lab today via femoral access and had DES to mid LAD. Pharmacy consulted to resume IV heparin 8 hrs post sheath removal and resume warfarin tonight.  Last heparin level was 0.23 on 1000 units/hr, rate was maintained since level was drawn early. INR was SUBtherapeutic yesterday at 1.15. Sheath was removed at 08:55. No overt bleeding noted, Hb is low stable, platelets are normal.  PTA warfarin dose: 5 mg daily except 7.5 mg on Wed  Goal of Therapy:  Heparin level 0.3-0.7 units/ml INR 2.5-3.5 Monitor platelets by anticoagulation protocol: Yes   Plan:  - Resume heparin drip at 1050 units IV with no bolus at 17:00 - Warfarin 7.5 mg PO tonight - 6 hr heparin level - Daily heparin level, CBC, and INR - Monitor for s/sx of bleeding  [facility], [place].D., BCPS Clinical  Pharmacist Pager: [phone] 12/09/2014 10:03 AM

## 2014-12-09 NOTE — Telephone Encounter (Signed)
Raiford Simmonds from [facility] called to say she has not been able to get a whole of pt. She had an order to check INR this week but has not been able to reach her. She said she will try again next week since they calling fro bad weather.    [phone]

## 2014-12-09 NOTE — Progress Notes (Signed)
Physical Therapy Treatment Patient Details Name: Nickey Canedo MRN: [id number] DOB: 03-10-1961 Today's Date: 12/09/2014    History of Present Illness 54 y.o. female with h/o type 1 DM, multiple CVA with L HP, HTN, aortic valve replacement admitted with AMS, sepsis, DKA.  Pt to undergo a cardiac cath at [facility] on 1/20.    PT Comments    Progressing well.  Feels good post Cath.  Easily managed the 600+ feet in the halls without SOB.  Sats maintained in 90's and EHR in the 80's/90's.  We missed doing the stairs, but discussed safe technique and pt stated she goes up with her good foot first anyway.  Follow Up Recommendations  Home health PT     Equipment Recommendations  None recommended by PT    Recommendations for Other Services       Precautions / Restrictions Precautions Precautions: Fall    Mobility  Bed Mobility                  Transfers Overall transfer level: Needs assistance Equipment used: None Transfers: Sit to/from Stand Sit to Stand: Supervision         General transfer comment: safety only  Ambulation/Gait Ambulation/Gait assistance: Supervision (pushing IV pole) Ambulation Distance (Feet): 640 Feet Assistive device:  (iv pole) Gait Pattern/deviations: Step-through pattern Gait velocity: decreased   General Gait Details: mild,but stable hemiparetic gait on left   Stairs            Wheelchair Mobility    Modified Rankin (Stroke Patients Only)       Balance Overall balance assessment: Needs assistance Sitting-balance support: No upper extremity supported Sitting balance-Leahy Scale: Good     Standing balance support: No upper extremity supported Standing balance-Leahy Scale: Good                      Cognition Arousal/Alertness: Awake/alert Behavior During Therapy: WFL for tasks assessed/performed Overall Cognitive Status: Within Functional Limits for tasks assessed                      Exercises       General Comments        Pertinent Vitals/Pain Pain Assessment: No/denies pain    Home Living                      Prior Function            PT Goals (current goals can now be found in the care plan section) Acute Rehab PT Goals Patient Stated Goal: return to prior level of independence, get LLE stronger PT Goal Formulation: With patient Time For Goal Achievement: 12/20/14 Potential to Achieve Goals: Good Progress towards PT goals: Progressing toward goals    Frequency  Min 3X/week    PT Plan Current plan remains appropriate    Co-evaluation             End of Session   Activity Tolerance: Patient tolerated treatment well Patient left: in chair;with call bell/phone within reach     Time: 1602-1622 PT Time Calculation (min) (ACUTE ONLY): 20 min  Charges:  $Gait Training: 8-22 mins                    G Codes:      Taci Sterling, Eliseo Gum 12/09/2014, 4:32 PM 12/09/2014  Gladwin Bing, PT [phone]  (pager)

## 2014-12-09 NOTE — Care Management Note (Addendum)
Page 1 of 2   12/10/2014     2:59:30 PM CARE MANAGEMENT NOTE 12/10/2014  Patient:  DORLA, GUIZAR   Account Number:  [id number]  Date Initiated:  12/07/2014  Documentation initiated by:  Edwyna Shell  Subjective/Objective Assessment:   54 yo female admitted with altered mental status and DKA from home     Action/Plan:   discharge planning   Anticipated DC Date:  12/09/2014   Anticipated DC Plan:  [place]  CM consult  Medication Assistance      [facility] Choice  HOME HEALTH   Choice offered to / List presented to:  C-1 Patient        [place] arranged  HH-1 RN  [place]      [place].   Status of service:  Completed, signed off Medicare Important Message given?  YES (If response is "NO", the following Medicare IM given date fields will be blank) Date Medicare IM given:  12/07/2014 Medicare IM given by:  Edwyna Shell Date Additional Medicare IM given:  12/09/2014 Additional Medicare IM given by:  Nashly Olsson  Discharge Disposition:  [place]  Per UR Regulation:  Reviewed for med. necessity/level of care/duration of stay  If discussed at [place] of Stay Meetings, dates discussed:   12/09/2014    Comments:  Mariann Laster RN, BSN, [place], CCM  Nurse - Case Manager,  (Unit [phone]  12/10/2014 Lovenox update: per ---12/10/2014 1450 by Adrian Saran--- [place] at [phone]. Talked to [place] (Ref # [id number]). Ed stated that LOVENOX is not covered under patient's prescription plan. Patient would have to pay the full price of $1324.96 for LOVENOX. The generic ENOXAPARIN SODIUM is covered by [facility]. Patient's $300.00 Prescription deductible would have to be met first. After the deductible is met, the generic's co-pay would be $1.20. These medications are TIER 4 Level.   Kaoru Rezendes RN, BSN, MSHL, CCM  Nurse - Case Manager,  (Unit [phone]  12/10/2014 Please assure pt can obtain and afford lovenox from her preferred pharmacy - will likely require 5 - 7 days of tx w/ dose to be determined per Pharmacy ---12/10/2014 1111 by Mariann Laster--- [facility] Benefits check:  request sent. Lovenox 17m 2 times / day x 5-7 days status post  prosthetic AoV replacement Coverage, co-pay, authorization, deductibles, pharmacy. Thanks, CMariann LasterRN, BSN, MSHL, CCM  Nurse - Case Manager,  (Unit [phone] 12/10/2014     CMariann LasterRN, BSN, MSHL, CCM  Nurse - Case Manager,  (Unit 3[place] [phone] 12/09/2014 Home with HHS:  RN, PT, OT, SW Hx/o 2 admissions and 2 ER visit over the past 6 months. 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  Please provide community resources and encourage compliance with completing paperwork, meeting deadlines in order to provide services needed. PCP:  Dr. DWynetta Emery([facility]/[place] notified)   12/07/14 AEdwyna ShellRN BSN CM [phone] lives at home with her 854year old son. Her sister and a friend drives them to appointments. Patient stated she has a walker, cane, wheelchair and BSC on house. [facility] and patient stated that she has used [facility] in the past and  would like to have them provide the [facility] services again. She also requested help with Medicaid. She stated that she had Medicaid with [facility] and then moved into a new apartment and was late in receiving and returning the required documentation. Encouraged the patient to contact DSS to follow up on status of application, and patient stated she has the number.

## 2014-12-09 NOTE — CV Procedure (Signed)
PERCUTANEOUS CORONARY INTERVENTION REPORT  NAME:  Madison [name]   MRN: [id number] DOB:  27-Nov-1960   ADMIT DATE: 12/03/2014 Procedure Date: 12/09/2014  INTERVENTIONAL CARDIOLOGIST: Leonie Man, M.D., MS PRIMARY CARE PROVIDER: Donia Ast, FNP PRIMARY CARDIOLOGIST: Martinique, Peter, MD  PATIENT:  Madison [name] is a 54 y.o. female with a history of AVR for Bicuspid AoValve (2012) with DM-1 (on insulin with prior h/o DKA) & prior CVA.  She was admitted to [facility] on 1/15 with DKA & noted to have + Troponin levels c/w NSTEMI.   She was medically stabilized, and referred for cardiac catheterization yesterday by Dr. Fletcher Anon.  This was performed via radial access, and a long mid LAD 90% lesion was noted. Unfortunately, he was unable to perform PCI because the guide catheter would not advance up the radial artery due to spasm.  The procedure was aborted with plans for a Staged PCI today.  PRE-OPERATIVE DIAGNOSIS:    NSTEMI  KNOWN CAD - STAGED PCI  PROCEDURES PERFORMED:    PERCUTANEOUS CORONARY INTERVENTION OF MID LAD 90 % TUBULAR LESION -- Synergy DES 2.5 mm x 32 mm (2.8 mm)  ANGIOSEAL CLOSURE OF RFA ARTERIOTOMY  PROCEDURE: The patient was brought to the [facility] in the fasting state and prepped and draped in the usual sterile fashion for Right Femoral artery access.  Sterile technique was used including antiseptics, cap, gloves, gown, hand hygiene, mask and sheet. Skin prep: Chlorhexidine.   Consent: Risks of procedure as well as the alternatives and risks of each were explained to the (patient/caregiver). Consent for procedure obtained.   Time Out: Verified patient identification, verified procedure, site/side was marked, verified correct patient position, special equipment/implants available, medications/allergies/relevent history reviewed, required imaging and test results available. Performed.  Access:   RIGHT COMMON FEMORAL Artery: 6  Fr Sheath -  fluoroscopically guided modified Seldinger Technique   FINDINGS:  Hemodynamics:   Central Aortic Pressure / Mean: 143/61/92 mmHg  Percutaneous Coronary Intervention:     LESION: 90% long tubular eccentric LAD lesion extending into the proximal vessel, TIMI 3 flow   Post PCI:  0% Stenosis, TIMI 3 flow  Guide: 6 Fr   XB LAD 3.5 Guidewire: BMW Predilation Balloon: Euphora 2.0 mm x 15 mm;   8 Atm x 30 Sec (at 90% lesion - was occlusive), 10 Atm x 30 Sec at proximal area of "stepdown" Stent: Synergy DES 2.5 mm x 32 mm;   16 Atm x 30 Sec, 18 Atm x 30 Sec - final diameter distal = 2.78 mm Post-dilation Balloon: [place] Euphora 2.75 mm x 27 mm;   18 Atm x 30 Sec, 20 Atm x 30  Final Diameter: 2.8 mm   Post deployment angiography in multiple views, with and without guidewire in place revealed excellent stent deployment and lesion coverage.  There was no evidence of dissection or perforation.  Sheath removed in the CATH LAB with manual 6 Fr AngioSeal closure device deployment for hemostasis.   MEDICATIONS:  Anesthesia:  Local Lidocaine 16 ml  Sedation:  2 mg IV Versed, 50 mcg IV fentanyl ;   Premedication: ASA 81 mg, Plavix 75 mg  Omnipaque Contrast: 80 ml  Anticoagulation: Angiomax Bolus & drip  Anti-Platelet Agent:  On Oral Plavix (loaded yesterday)  PATIENT DISPOSITION:    The patient was transferred to the PACU holding area in a hemodynamicaly stable, chest pain free condition.  The patient tolerated the procedure well, and there were no  complications.  EBL:   < 10 ml  The patient was stable before, during, and after the procedure.  POST-OPERATIVE DIAGNOSIS:    Severe single vessel CAD involving a long lesion in the mid LAD   Staged PCI with a Synergy 2.5 mm x 32 mm DES -- post-dilated to 2.8 mm  PLAN OF CARE:  Standard Post-Femoral PCI care  Check P2Y12 Assay, in order to determine length of triple medication course. Consider stopping ASA after 1  month  Will need to resume IV Heparin 8 hr post sheath removal.  Restart Warfarin tonight. Given prior CVAs, recommendation by Dr. Martinique was to bridge to therapeutic INR   HARDING, Leonie Green, M.D., M.S. Interventional Cardiologist   Pager # [phone]

## 2014-12-09 NOTE — Progress Notes (Signed)
Occupational Therapy Treatment Patient Details Name: Madison [name] MRN: [id number] DOB: [birth year] Today's Date: 12/09/2014    History of present illness 54 y.o. female with h/o type 1 DM, multiple CVA with L HP, HTN, aortic valve replacement admitted with AMS, sepsis, DKA.  Pt to undergo a cardiac cath at [facility] on 1/20.   OT comments  Pt seen today for ADLs and functional mobility. Pt ambulating in room for ADLs and toileting with Supervision for safety and likely close to baseline. Pt is eager to return home.    Follow Up Recommendations  Home health OT;Supervision - Intermittent    Equipment Recommendations  None recommended by OT    Recommendations for Other Services      Precautions / Restrictions Precautions Precautions: Fall Restrictions Weight Bearing Restrictions: No       Mobility Bed Mobility Overal bed mobility: Modified Independent                Transfers Overall transfer level: Needs assistance Equipment used: None Transfers: Sit to/from Stand Sit to Stand: Supervision         General transfer comment: supervision for safety    Balance Overall balance assessment: Needs assistance Sitting-balance support: No upper extremity supported Sitting balance-Leahy Scale: Good     Standing balance support: No upper extremity supported Standing balance-Leahy Scale: Good                     ADL       Grooming: Wash/dry hands;Supervision/safety;Standing                   Toilet Transfer: Supervision/safety;Ambulation;Comfort height toilet   Toileting- Clothing Manipulation and Hygiene: Supervision/safety;Sit to/from stand         General ADL Comments: Pt reports feeling close to baseline and is eager to return home. Pt ambulated around room for toileting at ADLs at sink.                 Cognition   Behavior During Therapy: WFL for tasks assessed/performed Overall Cognitive Status: Within Functional Limits for tasks  assessed                                    Pertinent Vitals/ Pain       Pain Assessment: No/denies pain         Frequency Min 2X/week     Progress Toward Goals  OT Goals(current goals can now be found in the care plan section)  Progress towards OT goals: Progressing toward goals  Acute Rehab OT Goals Patient Stated Goal: return to prior level of independence, get LLE stronger OT Goal Formulation: With patient Time For Goal Achievement: 12/14/14 Potential to Achieve Goals: Good  Plan Discharge plan remains appropriate       End of Session Equipment Utilized During Treatment: Gait belt   Activity Tolerance Patient tolerated treatment well   Patient Left in chair;with call bell/phone within reach   Nurse Communication Mobility status        Time: 1450-1527 OT Time Calculation (min): 37 min  Charges: OT General Charges $OT Visit: 1 Procedure OT Treatments $Self Care/Home Management : 23-37 mins  Rae Lips 12/09/2014, 5:25 PM  Carney Living, OTR/L Occupational Therapist [phone] (pager)

## 2014-12-09 NOTE — H&P (View-Only) (Signed)
Subjective:  She feels well this morning.  No shortness of breath or chest pain.      Objective:  Vital Signs in the last 24 hours: BP 113/46 mmHg  Pulse 66  Temp(Src) 97.7 F (36.5 C) (Oral)  Resp 18  Ht 5\' 3"  (1.6 m)  Wt 175 lb 4.3 oz (79.5 kg)  BMI 31.05 kg/m2  SpO2 95%  Physical Exam: Pleasant white female in no acute distress, mildly dysarthric. Lungs:  Clear Cardiac:  Regular rhythm, normal S1 and S2, no S3, 1 to 2/6 systolic murmur Extremities:  No edema present  Intake/Output from previous day: 01/19 0701 - 01/20 0700 In: 630 [P.O.:240; I.V.:340; IV Piggyback:50] Out: 600 [Urine:600]  Weight Filed Weights   12/04/14 1351  Weight: 175 lb 4.3 oz (79.5 kg)    Lab Results: Basic Metabolic Panel:  Recent Labs  12/06/14 0500 12/08/14 0449  NA 143 141  K 4.0 3.7  CL 114* 110  CO2 25 26  GLUCOSE 107* 131*  BUN 7 8  CREATININE 0.75 0.76   CBC:  Recent Labs  12/07/14 0500 12/08/14 0449  WBC 6.1 5.7  HGB 9.1* 8.9*  HCT 28.2* 28.2*  MCV 86.8 88.1  PLT 177 197   Cardiac Enzymes:  Recent Labs  12/05/14 1150 12/05/14 1752  TROPONINI 6.55* 5.34*    Telemetry: Sinus rhythm  Echo: Study Conclusions  - Left ventricle: The cavity size was normal. There was mild focal basal and severe concentric hypertrophy of the septum. Systolic function was mildly reduced. The estimated ejection fraction was in the range of 45% to 50%. There is moderate hypokinesis of the mid-apicalanteroseptal myocardium. Doppler parameters are consistent with high ventricular filling pressure. - Aortic valve: A mechanical prosthesis was present and functioning normally. Valve area (VTI): 1.35 cm^2. Valve area (Vmax): 1.21 cm^2. - Mitral valve: Calcified annulus. Mildly thickened leaflets . There was mild regurgitation. - Pulmonary arteries: PA peak pressure: 31 mm Hg (S).  Impressions:  - There was no evidence of a vegetation.  Ecg: NSR, low voltage. T  wave inversion anteriorly.  INR 1.83 today.  Assessment/Plan:  1.  Non-ST elevation myocardial infarction versus demand ischemia in the setting of severe DKA and possible sepsis. Possible Takotsubo syndrome.  2.  Sepsis- resolved. Antibiotics per primary team. 3.  Diabetes with complications/ketoacidosis. 4.  Previous prosthetic mechanical aortic valve 5. Anemia  Plan cardiac cath today INR normalized. Warfarin on hold for procedure. Will need to resume coumadin post procedure with bridging Lovenox.  Anticipate she will stay at [facility] post cath.     Peter [facility] MD, [facility]  12/08/2014, 9:27 AM

## 2014-12-09 NOTE — Progress Notes (Signed)
[place] TEAM 1 - Stepdown/ICU TEAM Progress Note  Madison [name] GMW:[id number] DOB: 1961/08/16 DOA: 12/03/2014 PCP: Janell Quiet, FNP  Admit HPI / Brief Narrative: Patient is a 54 year old WF PMHX  type 1 diabetes mellitus on insuline pump until 2014 when she had a stroke , aortic valve replacement, hypertension, dyslipidemia, history of CVA, noncompliant  who was admitted to the medicine service on 12/03/2014. She presented to the emergency room with mental status changes, having initial blood pressure of 80/66. Initial workup revealed diabetic ketoacidosis having an anion gap of 29 with bicarbonate of 8 and a glucose of 1047. Her white count was 30,000. Patient likely to be septic although did not have an obvious source of infection. Urinalysis was negative for nitrates and leukocyte esterase, chest x-ray did not reveal obvious infiltrate. She was covered with empiric IV antimicrobial therapy with IV vancomycin and Zosyn. Pulmonary critical care medicine was consulted as she was admitted to the intensive care unit. Her troponins increased to 5.63 for which Cardiology was consulted. Troponin elevation felt to be secondary to demand ischemia. Patient transferred to [facility] for heart cath and anticipated to be discharged from [facility] once INR therapeutic. In [place] to diabetic ketoacidosis she was transitioned to basal insulin on 12/04/2014 with the discontinuation of IV insulin. Patient tolerating by mouth intake.    HPI/Subjective: 1/21 A/O 4, NAD, negative CP, negative SOB, patient states ambulated with negative DOE  Assessment/Plan: Sepsis due to other etiology -Hypovolemic Shock 2/2 Dehydration, reviewed BMP and appears to be resolving resolved -Reviewed BMP leukocytosis has resolved .  -Reviewed all blood cultures, and infection source has not been determined . -Discontinued all antibiotics secondary to not finding infectious source and patient's leukocytosis  resolving   Diabetic ketoacidosis1 -Most likely cause was unknown infectious process precipitating DKA -DKA resolving by 12/04/2014 -Restart Levemir 20 units daily -Restart no long 3 units QAC  A  Diabetes type 1 uncontrolled -Reviewed labs and patient's 1/20 hemoglobin A1c= 9.0 -Obtain lipid panel  NSTEMI/Demand ischemia,Possible Takotsubo syndrome/S/P cardiac cath with DES stent placement -Reviewed patient's vitals and patient's hypotension has resolved, in fact patient becoming hypertensive  -Continue metoprolol to 12.5 mg BID -Start hydralazine IV 5 mg PRN SBP > 165 -Start Plavix 75 mg daily  Acute on chronic systolic CHF  -Reviewed echocardiogram from 12/04/2014 that showed an ejection fraction 45-50% with moderate hypokinesis -Continue heparin and warfarin per pharmacy   Mechanical aortic valve replacement/Supratherapeutic INR upon admission (6.23) -Heparin/warfarin per pharmacy Now anticoagulation is on hold, resume Coumadin and Lovenox after cardiac cath -INR goal 2-3  Aortic valve replacement -Pharmacy consult for warfarin management, Lovenox management  -INR goal 2-3  Hypokalemia -Potassium goal >4 -Magnesium goal>2 -Monitor potassium and magnesium   Acute kidney injury-resolved -Secondary to sepsis and hypovolemia as she initially presented with a creatinine of 2.06, now 0.76 -Resolve with administration of IV fluids   Code Status: FULL Family Communication: no family present at time of exam Disposition Plan: Per cardiology    Consultants: Dr. Onalee Hua Harding/Dr. Peter [place] (cardiology)    Procedure/Significant Events: 1/16 echocardiogram;- Left ventricle: mild focalbasal and severe concentric hypertrophy of the septum.  -LVEF= 45% to 50%. Moderate hypokinesis of mid-apicalanteroseptal myocardium.  - Pulmonary arteries: PA peak pressure: 31 mm Hg (S). 1/20 cardiac catheterization;Severe one-vessel coronary artery disease;90% stenosis mid  LAD. 1/21; Percutaneous coronary intervention MID LAD 90 % TUBULAR LESION -- Synergy DES 2.5 mm x 32 mm (2.8 mm)  Culture 1/15 blood left/right wrist negative 1/15 MRSA by PCR negative   Antibiotics: Zosyn 1/16>> stopped 1/21 Vancomycin 1/16>> stopped 1/18  DVT prophylaxis: Heparin   Devices NA   LINES / TUBES:  1/16 R IJ    Continuous Infusions: . sodium chloride 75 mL/hr at 12/09/14 0612  . heparin 1,050 Units/hr (12/09/14 1700)    Objective: VITAL SIGNS: Temp: 99.5 F (37.5 C) (01/21 1622) Temp Source: Oral (01/21 1622) BP: 147/66 mmHg (01/21 1622) Pulse Rate: 70 (01/21 1622) SPO2; FIO2:   Intake/Output Summary (Last 24 hours) at 12/09/14 1733 Last data filed at 12/09/14 1626  Gross per 24 hour  Intake 1000.5 ml  Output   2500 ml  Net -1499.5 ml     Exam: General: A/O 4, NAD, No acute respiratory distress, RIJ in place covered and clean, negative bleeding Lungs: Clear to auscultation bilaterally without wheezes or crackles Cardiovascular: Regular rate and rhythm without murmur gallop or rub normal S1 and S2 Abdomen: Nontender, nondistended, soft, bowel sounds positive, no rebound, no ascites, no appreciable mass Extremities: No significant cyanosis, clubbing, or edema bilateral lower extremities Neurologic; cranial nerves II through XII intact, tongue/uvula midline, extremity strength 5/5, sensation intact throughout, mild dysarthria  Data Reviewed: Basic Metabolic Panel:  Recent Labs Lab 12/04/14 0515  12/04/14 1630  12/05/14 0312 12/05/14 0445 12/06/14 0525 12/07/14 0500 12/08/14 0449 12/09/14 0342  NA  --   < > 136  < > 139  --  147* 143 141 138  K  --   < > 4.1  < > 3.5  --  3.9 4.0 3.7 4.1  CL  --   < > 113*  < > 119*  --  117* 114* 110 108  CO2  --   < > 16*  < > 19  --  25 25 26 24   GLUCOSE  --   < > 148*  < > 112*  --  75 107* 131* 294*  BUN  --   < > 20  < > 15  --  9 7 8  <5*  CREATININE  --   < > 1.03  < > 1.00  --  0.77  0.75 0.76 0.79  CALCIUM  --   < > 7.1*  < > 7.4*  --  7.8* 8.2* 8.3* 8.3*  MG 1.5  --  2.0  --   --  2.2  --   --   --   --   PHOS 1.2*  --  2.9  --   --  2.6  --   --   --   --   < > = values in this interval not displayed. Liver Function Tests:  Recent Labs Lab 12/03/14 1814 12/05/14 0445 12/07/14 0500 12/08/14 0449  AST 26 83* 90* 32  ALT 16 39* 74* 51*  ALKPHOS 99 61 90 84  BILITOT 1.4* 0.6 0.7 0.4  PROT 6.7 4.8* 5.5* 5.5*  ALBUMIN 3.9 2.6* 2.8* 2.6*   No results for input(s): LIPASE, AMYLASE in the last 168 hours. No results for input(s): AMMONIA in the last 168 hours. CBC:  Recent Labs Lab 12/03/14 1618 12/05/14 0445 12/06/14 0525 12/07/14 0500 12/08/14 0449 12/09/14 0342  WBC 30.0* 16.8* 7.3 6.1 5.7 6.6  NEUTROABS 27.0*  --   --   --   --   --   HGB 10.9* 8.8* 8.2* 9.1* 8.9* 9.3*  HCT 35.1* 26.4* 25.1* 28.2* 28.2* 28.6*  MCV 89.1 83.3 86.0 86.8 88.1  85.4  PLT 407* 261 171 177 197 182   Cardiac Enzymes:  Recent Labs Lab 12/04/14 1800 12/04/14 2336 12/05/14 0445 12/05/14 1150 12/05/14 1752  TROPONINI 10.71* 15.04* 11.13* 6.55* 5.34*   BNP (last 3 results)  Recent Labs  09/30/14 1431  PROBNP 794.3*   CBG:  Recent Labs Lab 12/09/14 0349 12/09/14 0625 12/09/14 0746 12/09/14 1223 12/09/14 1703  GLUCAP 271* 332* 299* 307* 189*    Recent Results (from the past 240 hour(s))  Culture, blood (routine x 2)     Status: None   Collection Time: 12/03/14  4:18 PM  Result Value Ref Range Status   Specimen Description BLOOD LEFT WRIST  Final   Special Requests BOTTLES DRAWN AEROBIC ONLY  Final   Culture   Final    NO GROWTH 5 DAYS Performed at [facility]    Report Status 12/09/2014 FINAL  Final  Culture, blood (routine x 2)     Status: None   Collection Time: 12/03/14  4:20 PM  Result Value Ref Range Status   Specimen Description BLOOD RIGHT WRIST  Final   Special Requests BOTTLES DRAWN AEROBIC AND ANAEROBIC 1.5ML  Final    Culture   Final    NO GROWTH 5 DAYS Performed at [facility]    Report Status 12/09/2014 FINAL  Final  MRSA PCR Screening     Status: None   Collection Time: 12/03/14 11:41 PM  Result Value Ref Range Status   MRSA by PCR NEGATIVE NEGATIVE Final    Comment:        The GeneXpert MRSA Assay (FDA approved for NASAL specimens only), is one component of a comprehensive MRSA colonization surveillance program. It is not intended to diagnose MRSA infection nor to guide or monitor treatment for MRSA infections.      Studies:  Recent x-ray studies have been reviewed in detail by the Attending Physician  Scheduled Meds:  Scheduled Meds: . antiseptic oral rinse  7 mL Mouth Rinse q12n4p  . aspirin  81 mg Oral Daily  . atorvastatin  80 mg Oral q1800  . chlorhexidine  15 mL Mouth Rinse BID  . clopidogrel  75 mg Oral Q breakfast  . darifenacin  7.5 mg Oral Daily  . feeding supplement (GLUCERNA SHAKE)  237 mL Oral BID BM  . insulin aspart  0-15 Units Subcutaneous TID WC  . insulin aspart  3 Units Subcutaneous TID WC  . insulin detemir  20 Units Subcutaneous Daily  . metoprolol tartrate  12.5 mg Oral BID  . pantoprazole  40 mg Oral Daily  . piperacillin-tazobactam (ZOSYN)  IV  3.375 g Intravenous Q8H  . sodium chloride  10-40 mL Intracatheter Q12H  . sodium chloride  3 mL Intravenous Q12H  . Warfarin - Pharmacist Dosing Inpatient   Does not apply q1800    Time spent on care of this patient: 40 mins   Drema Dallas , MD   [facility] Office  [phone] Pager [phone]  On-Call/Text Page:      [email or web address]      password [email or web address]  If 7PM-7AM, please contact night-coverage [email or web address] Password [facility] 12/09/2014, 5:33 PM   LOS: 6 days

## 2014-12-09 NOTE — Interval H&P Note (Signed)
History and Physical Interval Note:  12/09/2014 7:23 AM  Madison [name]  has presented today for surgery, with the diagnosis of Known CAD - NSTEMI - Planned STaged PCI of LAD. The various methods of treatment have been discussed with the patient and family. After consideration of risks, benefits and other options for treatment, the patient has consented to  Procedure(s): PERCUTANEOUS CORONARY STENT INTERVENTION (PCI-S) (N/A) as a surgical intervention .  The patient's history has been reviewed, patient examined, no change in status, stable for surgery.  I have reviewed the patient's chart and labs.  Questions were answered to the patient's satisfaction.    Cath Lab Visit (complete for each Cath Lab visit)  Clinical Evaluation Leading to the Procedure:   ACS: Yes.    Non-ACS:    Anginal Classification: CCS IV  Anti-ischemic medical therapy: Minimal Therapy (1 class of medications)  Non-Invasive Test Results: No non-invasive testing performed  Prior CABG: No previous CABG   TIMI Score  Patient Information:  TIMI Score is 3   UA/NSTEMI and intermediate-risk features (e.g., TIMI score 3-4) for short-term risk of death or nonfatal MI  Revascularization of the presumed culprit artery   A (9)  Indication: 10; Score: 9   HARDING, DAVID W

## 2014-12-10 DIAGNOSIS — I25119 Atherosclerotic heart disease of native coronary artery with unspecified angina pectoris: Secondary | ICD-10-CM

## 2014-12-10 LAB — LIPID PANEL
CHOLESTEROL: 101 mg/dL (ref 0–200)
HDL: 34 mg/dL — AB (ref >39)
LDL Cholesterol: 51 mg/dL (ref 0–99)
TRIGLYCERIDES: 82 mg/dL (ref <150)
Total CHOL/HDL Ratio: 3 RATIO
VLDL: 16 mg/dL (ref 0–40)

## 2014-12-10 LAB — HEPARIN LEVEL (UNFRACTIONATED)
HEPARIN UNFRACTIONATED: 0.15 [IU]/mL — AB (ref 0.30–0.70)
Heparin Unfractionated: 0.55 IU/mL (ref 0.30–0.70)

## 2014-12-10 LAB — BASIC METABOLIC PANEL
ANION GAP: 5 (ref 5–15)
BUN: <5 mg/dL — ABNORMAL LOW (ref 6–23)
CO2: 27 mmol/L (ref 19–32)
CREATININE: 0.78 mg/dL (ref 0.50–1.10)
Calcium: 8.5 mg/dL (ref 8.4–10.5)
Chloride: 108 mEq/L (ref 96–112)
GLUCOSE: 259 mg/dL — AB (ref 70–99)
Potassium: 3.7 mmol/L (ref 3.5–5.1)
SODIUM: 140 mmol/L (ref 135–145)

## 2014-12-10 LAB — GLUCOSE, CAPILLARY
GLUCOSE-CAPILLARY: 277 mg/dL — AB (ref 70–99)
GLUCOSE-CAPILLARY: 69 mg/dL — AB (ref 70–99)
Glucose-Capillary: 283 mg/dL — ABNORMAL HIGH (ref 70–99)
Glucose-Capillary: 101 mg/dL — ABNORMAL HIGH (ref 70–99)

## 2014-12-10 LAB — CBC
HCT: 26.1 % — ABNORMAL LOW (ref 36.0–46.0)
Hemoglobin: 8.4 g/dL — ABNORMAL LOW (ref 12.0–15.0)
MCH: 27.4 pg (ref 26.0–34.0)
MCHC: 32.2 g/dL (ref 30.0–36.0)
MCV: 85 fL (ref 78.0–100.0)
Platelets: 230 10*3/uL (ref 150–400)
RBC: 3.07 MIL/uL — ABNORMAL LOW (ref 3.87–5.11)
RDW: 17.9 % — AB (ref 11.5–15.5)
WBC: 7.2 10*3/uL (ref 4.0–10.5)

## 2014-12-10 LAB — MAGNESIUM: Magnesium: 1.8 mg/dL (ref 1.5–2.5)

## 2014-12-10 LAB — PROTIME-INR
INR: 1.02 (ref 0.00–1.49)
PROTHROMBIN TIME: 13.5 s (ref 11.6–15.2)

## 2014-12-10 MED ORDER — INSULIN DETEMIR 100 UNIT/ML ~~LOC~~ SOLN
30.0000 [IU] | Freq: Every day | SUBCUTANEOUS | Status: DC
  Administered 2014-12-10: 11:00:00 30 [IU] via SUBCUTANEOUS
  Filled 2014-12-10: qty 0.3

## 2014-12-10 MED ORDER — ENOXAPARIN SODIUM 80 MG/0.8ML ~~LOC~~ SOLN
80.0000 mg | Freq: Two times a day (BID) | SUBCUTANEOUS | Status: DC
  Administered 2014-12-10 – 2014-12-15 (×11): 80 mg via SUBCUTANEOUS
  Filled 2014-12-10 (×12): qty 0.8

## 2014-12-10 MED ORDER — INSULIN DETEMIR 100 UNIT/ML ~~LOC~~ SOLN
26.0000 [IU] | Freq: Every day | SUBCUTANEOUS | Status: DC
  Administered 2014-12-11: 26 [IU] via SUBCUTANEOUS
  Filled 2014-12-10: qty 0.26

## 2014-12-10 MED ORDER — LISINOPRIL 5 MG PO TABS: 5.0000 mg | ORAL_TABLET | Freq: Every day | ORAL | Status: DC

## 2014-12-10 MED ORDER — INSULIN ASPART 100 UNIT/ML ~~LOC~~ SOLN
4.0000 [IU] | Freq: Three times a day (TID) | SUBCUTANEOUS | Status: DC
  Administered 2014-12-10: 4 [IU] via SUBCUTANEOUS

## 2014-12-10 MED ORDER — INSULIN ASPART 100 UNIT/ML ~~LOC~~ SOLN: 6.0000 [IU] | Freq: Three times a day (TID) | SUBCUTANEOUS | Status: DC

## 2014-12-10 MED ORDER — LISINOPRIL 2.5 MG PO TABS
2.5000 mg | ORAL_TABLET | Freq: Every day | ORAL | Status: DC
  Administered 2014-12-10 – 2014-12-15 (×6): 2.5 mg via ORAL
  Filled 2014-12-10 (×6): qty 1

## 2014-12-10 MED ORDER — INSULIN ASPART 100 UNIT/ML ~~LOC~~ SOLN
3.0000 [IU] | Freq: Three times a day (TID) | SUBCUTANEOUS | Status: DC
  Administered 2014-12-11 (×2): 3 [IU] via SUBCUTANEOUS

## 2014-12-10 MED ORDER — WARFARIN SODIUM 7.5 MG PO TABS
7.5000 mg | ORAL_TABLET | Freq: Once | ORAL | Status: AC
  Administered 2014-12-10: 18:00:00 7.5 mg via ORAL
  Filled 2014-12-10: qty 1

## 2014-12-10 MED FILL — Dextrose Inj 5%: INTRAVENOUS | Qty: 1000 | Status: AC

## 2014-12-10 NOTE — Progress Notes (Addendum)
ANTICOAGULATION CONSULT NOTE - Follow Up Consult  Pharmacy Consult for heparin and warfarin Indication: mechanical AVR  Allergies  Allergen Reactions  . Celebrex [Celecoxib] Rash  . Detrol [Tolterodine] Hives    Patient Measurements: Height: 5\' 3"  (160 cm) Weight: 179 lb 14.3 oz (81.6 kg) IBW/kg (Calculated) : 52.4 Heparin Dosing Weight: 69 kg  Vital Signs: Temp: 98.2 F (36.8 C) (01/22 0800) Temp Source: Oral (01/22 0800) BP: 129/87 mmHg (01/22 0900) Pulse Rate: 87 (01/22 0900)  Labs:  Recent Labs  12/08/14 0449 12/09/14 0342 12/10/14 0045 12/10/14 0400 12/10/14 0800  HGB 8.9* 9.3*  --  8.4*  --   HCT 28.2* 28.6*  --  26.1*  --   PLT 197 182  --  230  --   LABPROT 14.8  --   --   --   --   INR 1.15  --   --   --   --   HEPARINUNFRC  --  0.23* 0.15*  --  0.55  CREATININE 0.76 0.79  --  0.78  --     Estimated Creatinine Clearance: 82.3 mL/min (by C-G formula based on Cr of 0.78).   Medications:  Infusions:  . sodium chloride 10 mL/hr at 12/10/14 0700  . heparin 1,200 Units/hr (12/10/14 0700)    Assessment: 54 y/o female with mechanical AVR transferred [facility] to [facility] for cath. Severe one vessel CAD found but unable to perform PCI during first cath due to radial access. She went back to cath lab via femoral access and had DES to mid LAD. Pharmacy consulted to manage heparin to warfarin bridge.  Heparin level is therapeutic at 0.55 on 1200 units/hr. INR is was not drawn today although order in - will see if lab can add on. No overt bleeding noted, Hb is low stable, platelets are normal.  PTA warfarin dose: 5 mg daily except 7.5 mg on Wed  Goal of Therapy:  Heparin level 0.3-0.7 units/ml INR 2.5-3.5 Monitor platelets by anticoagulation protocol: Yes   Plan:  - Continue heparin drip at 1200 units/hr - Warfarin 7.5 mg PO tonight pending INR for today - 6 hr confirmatory heparin level - Daily heparin level, CBC, and INR - Monitor for s/sx of  bleeding  [facility], [place].D., BCPS Clinical Pharmacist Pager: [phone] 12/10/2014 10:03 AM    Addendum: Pharmacy consulted to transition IV heparin to Lovenox.   Discontinue IV heparin Lovenox 80 mg SQ q12h - begin 1 hr after IV heparin turned off CBC q72h while on Lovenox  [facility], [facility].D., BCPS Clinical Pharmacist Pager: [phone] 12/10/2014 10:15 AM   Addendum: INR is SUBtherapeutic at 1.02 today. Continue with plan above.  [place], [place].D., BCPS Clinical Pharmacist Pager: [phone] 12/10/2014 12:26 PM

## 2014-12-10 NOTE — Progress Notes (Signed)
[place] TEAM 1 - Stepdown/ICU TEAM Progress Note  Madison [name] YQI:[id number] DOB: [birth year] DOA: 12/03/2014 PCP: Janell Quiet, FNP  Admit HPI / Brief Narrative: 54 year old F HX type 1 diabetes mellitus on insulin pump until 2014 when she had a stroke , aortic valve replacement, hypertension, dyslipidemia, CVA, and noncompliance who was admitted 12/03/2014 after she presented to the emergency room with mental status changes and an initial blood pressure of 80/66.  Workup revealed diabetic ketoacidosis and a white count of 30,000. There was no obvious source of infection. Urinalysis was negative for nitrates and leukocyte esterase, chest x-ray did not reveal obvious infiltrate. She was covered with empiric IV antimicrobial therapy.  She was admitted to the intensive care unit. Her troponins increased to 5.63 for which Cardiology was consulted. Troponin elevation felt to be secondary to demand ischemia. Patient transferred to [facility] for heart cath and anticipated to be discharged from [facility] once INR therapeutic. In regard to diabetic ketoacidosis she was transitioned to basal insulin on 12/04/2014 with the discontinuation of IV insulin.   HPI/Subjective: Pt is resting comfortably with no new complaints.  She denies cp, n/v, abdom pain, or sob.    Assessment/Plan:  SIRS - Hypovolemic shock -Hypovolemic shock 2/2 dehydration -Discontinued all antibiotics secondary to not finding infectious source and patient's leukocytosis resolved  Diabetic ketoacidosis in uncontrolled DM Type 1 -DKA resolved by 12/04/2014 -resumed home insulin but at lower dose - CBG climbing, therefore titrating levemir upward -A1c 9.0  NSTEMI / CAD S/P cardiac cath with DES stent to long lesion mid LAD 1/21 -care was provided by Cardiology - cont asa, statin, bb, plavix  Acute on chronic combined systolic/diastolic CHF -TTE 12/04/2014 showed EF 45-50% - initiated ACE this admit  -Continue  heparin and warfarin per pharmacy  Mechanical aortic valve replacement / Supratherapeutic INR upon admission (6.23) -Heparin/warfarin per pharmacy and Cardiology  -INR goal 2-3  Rheumatoid arthritis  -quiescent   HTN -BP currently reasonably controlled but w/ room for improvement - ACE initiated   Hypokalemia -Potassium goal >4 -Magnesium goal>2 -Monitor potassium and magnesium   Acute kidney injury - resolved -Secondary to SIRS and hypovolemia as she initially presented with a creatinine of 2.06, now 0.76 -Resolved with administration of IV fluids   Code Status: FULL Family Communication: no family present at time of exam Disposition Plan: transfer to med bed - resume warfarin w/ lovenox bridge - adjust insulin regimen - possible d/c next 24-48hrs if CBG controlled and pt able to obtain lovenox  Consultants: Cardiology PCCM  Procedures: 1/16 echocardiogram;- Left ventricle: mild focalbasal and severe concentric hypertrophy of the septum - LVEF= 45% to 50%. Moderate hypokinesis of mid-apicalanteroseptal myocardium - Pulmonary arteries: PA peak pressure: 31 mm Hg (S). 1/20 cardiac catheterization Severe one-vessel coronary artery disease;90% stenosis mid LAD. 1/21; Percutaneous coronary intervention MID LAD 90 % TUBULAR LESION -- Synergy DES 2.5 mm x 32 mm (2.8 mm)  Antibiotics: Zosyn 1/15 > 1/21 Vancomycin 1/16 > 1/17  DVT prophylaxis: IV Heparin > lovenox >  warfarin  Objective: Blood pressure 129/87, pulse 87, temperature 98.2 F (36.8 C), temperature source Oral, resp. rate 20, height 5\' 3"  (1.6 m), weight 81.6 kg (179 lb 14.3 oz), SpO2 100 %.  Intake/Output Summary (Last 24 hours) at 12/10/14 0937 Last data filed at 12/10/14 0800  Gross per 24 hour  Intake 2274.17 ml  Output   2850 ml  Net -575.83 ml   Exam: General: No acute respiratory distress,  RIJ in place covered and clean, negative bleeding Lungs: Clear to auscultation bilaterally without wheezes or  crackles Cardiovascular: Regular rate and rhythm without murmur gallop or rub  Abdomen: Nontender, nondistended, soft, bowel sounds positive, no rebound, no ascites, no appreciable mass Extremities: No significant cyanosis, clubbing, or edema bilateral lower extremities  Data Reviewed: Basic Metabolic Panel:  Recent Labs Lab 12/04/14 0515  12/04/14 1630  12/05/14 0445 12/06/14 0525 12/07/14 0500 12/08/14 0449 12/09/14 0342 12/10/14 0400  NA  --   < > 136  < >  --  147* 143 141 138 140  K  --   < > 4.1  < >  --  3.9 4.0 3.7 4.1 3.7  CL  --   < > 113*  < >  --  117* 114* 110 108 108  CO2  --   < > 16*  < >  --  25 25 26 24 27   GLUCOSE  --   < > 148*  < >  --  75 107* 131* 294* 259*  BUN  --   < > 20  < >  --  9 7 8  <5* <5*  CREATININE  --   < > 1.03  < >  --  0.77 0.75 0.76 0.79 0.78  CALCIUM  --   < > 7.1*  < >  --  7.8* 8.2* 8.3* 8.3* 8.5  MG 1.5  --  2.0  --  2.2  --   --   --   --  1.8  PHOS 1.2*  --  2.9  --  2.6  --   --   --   --   --   < > = values in this interval not displayed.   Liver Function Tests:  Recent Labs Lab 12/03/14 1814 12/05/14 0445 12/07/14 0500 12/08/14 0449  AST 26 83* 90* 32  ALT 16 39* 74* 51*  ALKPHOS 99 61 90 84  BILITOT 1.4* 0.6 0.7 0.4  PROT 6.7 4.8* 5.5* 5.5*  ALBUMIN 3.9 2.6* 2.8* 2.6*   CBC:  Recent Labs Lab 12/03/14 1618  12/06/14 0525 12/07/14 0500 12/08/14 0449 12/09/14 0342 12/10/14 0400  WBC 30.0*  < > 7.3 6.1 5.7 6.6 7.2  NEUTROABS 27.0*  --   --   --   --   --   --   HGB 10.9*  < > 8.2* 9.1* 8.9* 9.3* 8.4*  HCT 35.1*  < > 25.1* 28.2* 28.2* 28.6* 26.1*  MCV 89.1  < > 86.0 86.8 88.1 85.4 85.0  PLT 407*  < > 171 177 197 182 230  < > = values in this interval not displayed.   Cardiac Enzymes:  Recent Labs Lab 12/04/14 1800 12/04/14 2336 12/05/14 0445 12/05/14 1150 12/05/14 1752  TROPONINI 10.71* 15.04* 11.13* 6.55* 5.34*   BNP (last 3 results)  Recent Labs  09/30/14 1431  PROBNP 794.3*    CBG:  Recent Labs Lab 12/09/14 0746 12/09/14 1223 12/09/14 1703 12/09/14 2048 12/10/14 0703  GLUCAP 299* 307* 189* 181* 277*    Recent Results (from the past 240 hour(s))  Culture, blood (routine x 2)     Status: None   Collection Time: 12/03/14  4:18 PM  Result Value Ref Range Status   Specimen Description BLOOD LEFT WRIST  Final   Special Requests BOTTLES DRAWN AEROBIC ONLY 12/12/14  Final   Culture   Final    NO GROWTH 5 DAYS Performed at 12/05/14  Report Status 12/09/2014 FINAL  Final  Culture, blood (routine x 2)     Status: None   Collection Time: 12/03/14  4:20 PM  Result Value Ref Range Status   Specimen Description BLOOD RIGHT WRIST  Final   Special Requests BOTTLES DRAWN AEROBIC AND ANAEROBIC 1.5ML  Final   Culture   Final    NO GROWTH 5 DAYS Performed at [facility]    Report Status 12/09/2014 FINAL  Final  MRSA PCR Screening     Status: None   Collection Time: 12/03/14 11:41 PM  Result Value Ref Range Status   MRSA by PCR NEGATIVE NEGATIVE Final    Comment:        The GeneXpert MRSA Assay (FDA approved for NASAL specimens only), is one component of a comprehensive MRSA colonization surveillance program. It is not intended to diagnose MRSA infection nor to guide or monitor treatment for MRSA infections.      Studies:  Recent x-ray studies have been reviewed in detail by the Attending Physician  Scheduled Meds:  Scheduled Meds: . antiseptic oral rinse  7 mL Mouth Rinse q12n4p  . aspirin  81 mg Oral Daily  . atorvastatin  80 mg Oral q1800  . chlorhexidine  15 mL Mouth Rinse BID  . clopidogrel  75 mg Oral Q breakfast  . darifenacin  7.5 mg Oral Daily  . feeding supplement (GLUCERNA SHAKE)  237 mL Oral BID BM  . insulin aspart  0-15 Units Subcutaneous TID WC  . insulin aspart  3 Units Subcutaneous TID WC  . insulin detemir  20 Units Subcutaneous Daily  . metoprolol tartrate  12.5 mg Oral BID  . pantoprazole  40 mg Oral  Daily  . sodium chloride  10-40 mL Intracatheter Q12H  . sodium chloride  3 mL Intravenous Q12H  . Warfarin - Pharmacist Dosing Inpatient   Does not apply q1800    Time spent on care of this patient: 35 mins  Lonia Blood, MD [facility] For Consults/Admissions - Flow Manager - [phone] Office  [phone]  Contact MD directly via text page:      [email or web address]      password [facility]  12/10/2014, 9:37 AM   LOS: 7 days

## 2014-12-10 NOTE — Progress Notes (Signed)
Patient Name: Madison [name] Date of Encounter: 12/10/2014   Principal Problem:   Severe sepsis with acute organ dysfunction Active Problems:   CAD (coronary artery disease)   Diabetic ketoacidosis without coma associated with type 1 diabetes mellitus   NSTEMI (non-ST elevated myocardial infarction)   Sepsis due to other etiology   Acute on chronic systolic CHF (congestive heart failure)   HTN (hypertension)   Rheumatoid arthritis   Type 1 diabetes mellitus with neurological manifestations   AKI (acute kidney injury)   Lactic acidosis   Diabetes type 1, uncontrolled   S/P aortic valve replacement   Hypokalemia   Spastic hemiplegia affecting nondominant side   Supratherapeutic INR    SUBJECTIVE  No chest pain or sob.  R wrist/R femoral cath site stable.  CURRENT MEDS . antiseptic oral rinse  7 mL Mouth Rinse q12n4p  . aspirin  81 mg Oral Daily  . atorvastatin  80 mg Oral q1800  . chlorhexidine  15 mL Mouth Rinse BID  . clopidogrel  75 mg Oral Q breakfast  . darifenacin  7.5 mg Oral Daily  . feeding supplement (GLUCERNA SHAKE)  237 mL Oral BID BM  . insulin aspart  0-15 Units Subcutaneous TID WC  . insulin aspart  3 Units Subcutaneous TID WC  . insulin detemir  20 Units Subcutaneous Daily  . metoprolol tartrate  12.5 mg Oral BID  . pantoprazole  40 mg Oral Daily  . sodium chloride  10-40 mL Intracatheter Q12H  . sodium chloride  3 mL Intravenous Q12H  . Warfarin - Pharmacist Dosing Inpatient   Does not apply q1800    OBJECTIVE  Filed Vitals:   12/09/14 1622 12/09/14 2000 12/09/14 2347 12/10/14 0400  BP: 147/66 145/53 119/52 148/56  Pulse: 70 64 72 66  Temp: 99.5 F (37.5 C) 100.5 F (38.1 C) 99.6 F (37.6 C) 98.9 F (37.2 C)  TempSrc: Oral Oral Oral Oral  Resp: 18 20 20 20   Height:      Weight:      SpO2: 100% 100% 97% 100%    Intake/Output Summary (Last 24 hours) at 12/10/14 0729 Last data filed at 12/10/14 0700  Gross per 24 hour  Intake 2154.17 ml    Output   3050 ml  Net -895.83 ml   Filed Weights   12/04/14 1351 12/09/14 0626  Weight: 175 lb 4.3 oz (79.5 kg) 179 lb 14.3 oz (81.6 kg)    PHYSICAL EXAM  General: Pleasant, NAD. Neuro: Alert and oriented X 3. Moves all extremities spontaneously. Psych: Normal affect. HEENT:  Normal  Neck: Supple without bruits or JVD. Lungs:  Resp regular and unlabored, CTA. Heart: RRR no s3, s4, 2/6 SEM RUSB. Abdomen: Soft, non-tender, non-distended, BS + x 4.  Extremities: No clubbing, cyanosis or edema. DP/PT/Radials 2+ and equal bilaterally. R wrist/R femoral cath sites w/o bleeding/bruits/hematomas.  Accessory Clinical Findings  CBC  Recent Labs  12/09/14 0342 12/10/14 0400  WBC 6.6 7.2  HGB 9.3* 8.4*  HCT 28.6* 26.1*  MCV 85.4 85.0  PLT 182 230   Basic Metabolic Panel  Recent Labs  12/09/14 0342 12/10/14 0400  NA 138 140  K 4.1 3.7  CL 108 108  CO2 24 27  GLUCOSE 294* 259*  BUN <5* <5*  CREATININE 0.79 0.78  CALCIUM 8.3* 8.5  MG  --  1.8   Liver Function Tests  Recent Labs  12/08/14 0449  AST 32  ALT 51*  ALKPHOS 84  BILITOT 0.4  PROT 5.5*  ALBUMIN 2.6*   Hemoglobin A1C  Recent Labs  12/08/14 0449  HGBA1C 9.0*   Fasting Lipid Panel  Recent Labs  12/10/14 0400  CHOL 101  HDL 34*  LDLCALC 51  TRIG 82  CHOLHDL 3.0   TELE  Sinus rhythm/sinus brady.  ECG  RSR, 61, antlat TWI.  ASSESSMENT AND PLAN  1.  Sepsis:  Hemodynamically stable. Low grade temp overnight, nl WBC.  Off abx.  2.  DKA:  Glucoses trending in 200's.  Per IM.  3.  Type II NSTEMI/CAD:  S/p PCI/DES of the LAD yesterday.  No chest pain overnight.  Cardiac rehab to see today.  Cont asa, statin, bb, plavix.  P2Y12 @ 1600 yesterday was elevated @ 259.  At that point, based on [facility], it looks like she received 600 mg on 1/20 and then 75 mg yesterday morning.  P2Y12 obtained in order to assist in determining length of ASA therapy in setting of chronic warfarin anticoagulation s/p  AVR.  Will d/w Dr. Katrinka Blazing.  4.  S/P prosthetic AoV replacement:  Nl valve fxn by echo this admission.  Coumadin resumed with heparin bridge.  As she has a h/o strokes following AVR, she should be bridged until therapeutic - either with heparin or lovenox if she is able to give herself injections.  5.  Acute on chronic combined systolic/diastolic CHF:  EF 45-50% by echo this admission.  Euvolemic on exam.  Cont bb.  BP has been trending in 140's.  Given DM, would benefit from ACEI.  Renal fxn has been stable since the 16th.  6.  HTN:  As above, BP trending in the 140's.  Follow and consider adding acei given h/o DM with mild LV dysfxn.  7.  HL:  LDL 51.  Cont statin.  ALT mildly elevated on admission.  8.  Normocytic anemia:  Follow.  9.  AKI:  Resolved.  Consider adding acei.  Signed, Nicolasa Ducking NP

## 2014-12-10 NOTE — Progress Notes (Signed)
ANTICOAGULATION CONSULT NOTE - Follow Up Consult  Pharmacy Consult for Heparin  Indication: Mechanical AVR  Allergies  Allergen Reactions  . Celebrex [Celecoxib] Rash  . Detrol [Tolterodine] Hives    Patient Measurements: Height: 5\' 3"  (160 cm) Weight: 179 lb 14.3 oz (81.6 kg) IBW/kg (Calculated) : 52.4  Vital Signs: Temp: 99.6 F (37.6 C) (01/21 2347) Temp Source: Oral (01/21 2347) BP: 119/52 mmHg (01/21 2347) Pulse Rate: 72 (01/21 2347)  Labs:  Recent Labs  12/07/14 0500 12/08/14 0449 12/09/14 0342 12/10/14 0045  HGB 9.1* 8.9* 9.3*  --   HCT 28.2* 28.2* 28.6*  --   PLT 177 197 182  --   LABPROT 21.3* 14.8  --   --   INR 1.83* 1.15  --   --   HEPARINUNFRC  --   --  0.23* 0.15*  CREATININE 0.75 0.76 0.79  --     Estimated Creatinine Clearance: 82.3 mL/min (by C-G formula based on Cr of 0.79).   Assessment: Sub-therapeutic heparin level, no issues per RN.   Goal of Therapy:  Heparin level 0.3-0.7 units/ml Monitor platelets by anticoagulation protocol: Yes   Plan:  -Increase heparin drip to 1200 units/hr -0800 HL -Daily CBC/HL -Monitor for bleeding  12/12/14 12/10/2014,1:35 AM

## 2014-12-10 NOTE — Progress Notes (Addendum)
CARDIAC REHAB PHASE I   PRE:  Rate/Rhythm: 77 SR  BP:  Supine: 134/48  Sitting:   Standing:    SaO2: 96%RA  MODE:  Ambulation: 700 ft   POST:  Rate/Rhythm: 97 SR  BP:  Supine:   Sitting: 129/87  Standing:    SaO2: 96-100%RA [id number] Pt went to bathroom prior to me seeing and needed to rest before walking, so ed done first. Did not give MI booklet as pt stated she was told a strain on heart from infection and diabetes but no one had used the words heart attack. Reviewed stent/plavix, NTG use, risk factors, diabetic diet with carb counting, restrictions, ex ed. Pt voiced understanding. Encouraged pt to view diabetic videos later. Pt stated she is going to get someone new to manage diabetes as HGBA1C 9.0 here and she stated it gets very low frequently at home. Has dealt with diabetes since about 18. Discussed CRP 2 and pt gave permission for referral to [place]. She does not drive so transportation may be an issue for her. Pt walked 700 ft holding to IV pole. Denied CP. Tired by end of walk but tolerated well. To sitting on side of bed after walk.     Luetta Nutting, RN BSN  12/10/2014 9:15 AM

## 2014-12-11 LAB — COMPREHENSIVE METABOLIC PANEL
ALT: 31 U/L (ref 0–35)
AST: 28 U/L (ref 0–37)
Albumin: 2.7 g/dL — ABNORMAL LOW (ref 3.5–5.2)
Alkaline Phosphatase: 74 U/L (ref 39–117)
Anion gap: 7 (ref 5–15)
BUN: <5 mg/dL — ABNORMAL LOW (ref 6–23)
CHLORIDE: 109 mmol/L (ref 96–112)
CO2: 26 mmol/L (ref 19–32)
CREATININE: 0.75 mg/dL (ref 0.50–1.10)
Calcium: 9 mg/dL (ref 8.4–10.5)
GFR calc Af Amer: >90 mL/min (ref >90)
GLUCOSE: 109 mg/dL — AB (ref 70–99)
POTASSIUM: 3.9 mmol/L (ref 3.5–5.1)
Sodium: 142 mmol/L (ref 135–145)
Total Bilirubin: 0.4 mg/dL (ref 0.3–1.2)
Total Protein: 5.6 g/dL — ABNORMAL LOW (ref 6.0–8.3)

## 2014-12-11 LAB — GLUCOSE, CAPILLARY
GLUCOSE-CAPILLARY: 153 mg/dL — AB (ref 70–99)
GLUCOSE-CAPILLARY: 75 mg/dL (ref 70–99)
Glucose-Capillary: 192 mg/dL — ABNORMAL HIGH (ref 70–99)
Glucose-Capillary: 349 mg/dL — ABNORMAL HIGH (ref 70–99)

## 2014-12-11 LAB — CBC
HEMATOCRIT: 29.4 % — AB (ref 36.0–46.0)
Hemoglobin: 9.4 g/dL — ABNORMAL LOW (ref 12.0–15.0)
MCH: 27.6 pg (ref 26.0–34.0)
MCHC: 32 g/dL (ref 30.0–36.0)
MCV: 86.5 fL (ref 78.0–100.0)
Platelets: 261 10*3/uL (ref 150–400)
RBC: 3.4 MIL/uL — AB (ref 3.87–5.11)
RDW: 18.4 % — ABNORMAL HIGH (ref 11.5–15.5)
WBC: 7.6 10*3/uL (ref 4.0–10.5)

## 2014-12-11 LAB — PROTIME-INR
INR: 1.13 (ref 0.00–1.49)
Prothrombin Time: 14.6 seconds (ref 11.6–15.2)

## 2014-12-11 LAB — TROPONIN I: Troponin I: 0.37 ng/mL — ABNORMAL HIGH (ref <0.031)

## 2014-12-11 LAB — PLATELET INHIBITION P2Y12: Platelet Function  P2Y12: 284 [PRU] (ref 194–418)

## 2014-12-11 MED ORDER — INSULIN ASPART 100 UNIT/ML ~~LOC~~ SOLN
6.0000 [IU] | Freq: Three times a day (TID) | SUBCUTANEOUS | Status: DC
  Administered 2014-12-11 – 2014-12-15 (×11): 6 [IU] via SUBCUTANEOUS

## 2014-12-11 MED ORDER — WARFARIN SODIUM 7.5 MG PO TABS
7.5000 mg | ORAL_TABLET | Freq: Once | ORAL | Status: AC
  Administered 2014-12-11: 7.5 mg via ORAL
  Filled 2014-12-11: qty 1

## 2014-12-11 MED ORDER — INSULIN DETEMIR 100 UNIT/ML ~~LOC~~ SOLN
30.0000 [IU] | Freq: Every day | SUBCUTANEOUS | Status: DC
  Administered 2014-12-12: 30 [IU] via SUBCUTANEOUS
  Filled 2014-12-11: qty 0.3

## 2014-12-11 NOTE — Progress Notes (Signed)
CARDIAC REHAB PHASE I   PRE:  Rate/Rhythm: 66  BP:  Sitting: 129/44     SaO2: 98  MODE:  Ambulation: 700 ft   POST:  Rate/Rhythm: 90  BP:  Sitting: 132/54     SaO2: 100 8:10am-8:54  Patient ambulated with x1 assist.  Patient stated that she felt no pain or complaints of fatigue.  Patient stated that it felt good to get up and walk around.  I encouraged her to get at least one more walk in today.  Patient was placed in chair and given phone and call bell.  Bland Span, MS 12/11/2014 8:53 AM

## 2014-12-11 NOTE — Progress Notes (Signed)
ANTICOAGULATION CONSULT NOTE - Follow Up Consult  Pharmacy Consult for Lovenox/Coumadin Indication: mechanical AVR + CVA's  Allergies  Allergen Reactions  . Celebrex [Celecoxib] Rash  . Detrol [Tolterodine] Hives    Patient Measurements: Height: 5\' 3"  (160 cm) Weight: 170 lb 13.7 oz (77.5 kg) IBW/kg (Calculated) : 52.4 Heparin Dosing Weight:   Vital Signs: Temp: 97.8 F (36.6 C) (01/23 0510) Temp Source: Oral (01/23 0510) BP: 142/49 mmHg (01/23 0510) Pulse Rate: 65 (01/22 2018)  Labs:  Recent Labs  12/09/14 0342 12/10/14 0045 12/10/14 0400 12/10/14 0800 12/11/14 0304  HGB 9.3*  --  8.4*  --  9.4*  HCT 28.6*  --  26.1*  --  29.4*  PLT 182  --  230  --  261  LABPROT  --   --   --  13.5 14.6  INR  --   --   --  1.02 1.13  HEPARINUNFRC 0.23* 0.15*  --  0.55  --   CREATININE 0.79  --  0.78  --  0.75  TROPONINI  --   --   --   --  0.37*    Estimated Creatinine Clearance: 80.1 mL/min (by C-G formula based on Cr of 0.75).   Assessment: 75 yoF with DM1 admitted with severe sepsis, acute organ dysfunction, DKA, NSTEMI. Has mechanical AVR with hx recurrent CVA when INR subtherapeutic; on chronic warfarin plus low-dose ASA.  Anticoagulation: Heparin -> Lovenox/Warf bridge for mech AVR/hx CVA s/p cath and mid LAD DES placed. No bleeding, Hgb 9.4. Plts 261 ok. INR goal 2.5-3.5, PTA dose: 5 mg daily except 7.5 mg on Wed with admit INR 6.23  ID: s/p r/o sepsis. WBC WNL, Afebrile/24h. Vanc 1/15>>1/18 Zosyn 1/15>>1/21 1/15 BCx2 - NEG  CV: NSTEMI (troponin peaked at 15), Cathed 1/20 and 1/21 w/ DES placed mid LAD. Checking P2Y12 to determine length of triple therapy. Mech AVR and hx CVA. EF 45-50% 12/04/14 (was 60-65% 03/09/14) - on asa81, atorv80, plavix, lopressor, lisinopril  Endocrine: DM1 s/p DKA (A1c 9), CBGs 69-277 on SSI, Levemir 26/d  GI/Nutr: PPI PTA, cont'd.  Neurology: Hx CVA, spastic hemiplegia  Nephrology: SCr and lytes WNL; darifenacin  Hematology /  Oncology: Hb 9.4 - low stable, plt WNL; Pt has RA on MTX (on hold)  PTA Medication Issues: off MTX until infection resolves (takes for RA), Lexapro  Best Practices: Enox/warf, PPI  Goal of Therapy:  INR 2.5-3.5 Monitor platelets by anticoagulation protocol: Yes   Plan:  Lovenox 80 mg SQ q12h Coumadin 7.5mg  po x 1 tonight. CBC q72h, INR daily Resume home Lexapro?  Kamill Fulbright S. 03/11/14, PharmD, BCPS Clinical Staff Pharmacist Pager [phone]  [phone] [place] 12/11/2014,7:01 AM

## 2014-12-11 NOTE — Progress Notes (Signed)
Subjective: No CP, SOB. Slept ok  Objective: Vital signs in last 24 hours: Temp:  [97.8 F (36.6 C)-98.3 F (36.8 C)] 97.8 F (36.6 C) (01/23 0510) Pulse Rate:  [65-87] 65 (01/22 2018) Resp:  [18-20] 20 (01/23 0510) BP: (112-142)/(45-87) 142/49 mmHg (01/23 0510) SpO2:  [96 %-100 %] 98 % (01/23 0510) Weight:  [170 lb 13.7 oz (77.5 kg)] 170 lb 13.7 oz (77.5 kg) (01/23 0510) Last BM Date: 12/08/14  Intake/Output from previous day: 01/22 0701 - 01/23 0700 In: 960 [P.O.:960] Out: 1551 [Urine:1550; Stool:1] Intake/Output this shift:    Medications Current Facility-Administered Medications  Medication Dose Route Frequency Provider Last Rate Last Dose  . acetaminophen (TYLENOL) tablet 650 mg  650 mg Oral Q4H PRN Iran Ouch, MD   650 mg at 12/08/14 2257  . antiseptic oral rinse (CPC / CETYLPYRIDINIUM CHLORIDE 0.05%) solution 7 mL  7 mL Mouth Rinse q12n4p Therisa Doyne, MD   7 mL at 12/10/14 1753  . aspirin chewable tablet 81 mg  81 mg Oral Daily Richarda Overlie, MD   81 mg at 12/10/14 1012  . atorvastatin (LIPITOR) tablet 80 mg  80 mg Oral q1800 Iran Ouch, MD   80 mg at 12/10/14 1753  . chlorhexidine (PERIDEX) 0.12 % solution 15 mL  15 mL Mouth Rinse BID Therisa Doyne, MD   15 mL at 12/10/14 1753  . clopidogrel (PLAVIX) tablet 75 mg  75 mg Oral Q breakfast Iran Ouch, MD   75 mg at 12/10/14 0730  . darifenacin (ENABLEX) 24 hr tablet 7.5 mg  7.5 mg Oral Daily Richarda Overlie, MD   7.5 mg at 12/10/14 1013  . enoxaparin (LOVENOX) injection 80 mg  80 mg Subcutaneous Q12H Maryanna Shape [place], [place]   80 mg at 12/10/14 2121  . feeding supplement (GLUCERNA SHAKE) (GLUCERNA SHAKE) liquid 237 mL  237 mL Oral BID BM Jeralyn Bennett, MD   237 mL at 12/10/14 1016  . hydrALAZINE (APRESOLINE) injection 5 mg  5 mg Intravenous Q6H PRN Drema Dallas, MD      . insulin aspart (novoLOG) injection 0-15 Units  0-15 Units Subcutaneous TID WC Jeralyn Bennett, MD   3 Units at  12/11/14 [phone]  . insulin aspart (novoLOG) injection 3 Units  3 Units Subcutaneous TID WC Lonia Blood, MD   3 Units at 12/11/14 [phone]  . insulin detemir (LEVEMIR) injection 26 Units  26 Units Subcutaneous Daily Lonia Blood, MD      . lisinopril (PRINIVIL,ZESTRIL) tablet 2.5 mg  2.5 mg Oral Daily Lonia Blood, MD   2.5 mg at 12/10/14 1045  . metoprolol tartrate (LOPRESSOR) tablet 12.5 mg  12.5 mg Oral BID Peter M [place], MD   12.5 mg at 12/10/14 2121  . ondansetron (ZOFRAN) injection 4 mg  4 mg Intravenous Q6H PRN Jeralyn Bennett, MD   4 mg at 12/06/14 0950  . pantoprazole (PROTONIX) EC tablet 40 mg  40 mg Oral Daily Coralyn Helling, MD   40 mg at 12/10/14 0730  . sodium chloride 0.9 % injection 10-40 mL  10-40 mL Intracatheter PRN Richarda Overlie, MD   10 mL at 12/08/14 0449  . warfarin (COUMADIN) tablet 7.5 mg  7.5 mg Oral ONCE-1800 Crystal [place], [facility]      . Warfarin - Pharmacist Dosing Inpatient   Does not apply q1800 [facility], [facility]      . zolpidem [facility]) tablet 5 mg  5 mg Oral QHS PRN  Leanne Chang, NP   5 mg at 12/10/14 2121    PE: General appearance: alert, cooperative and no distress Lungs: clear to auscultation bilaterally Heart: regular rate and rhythm and 1/6 sys MM Extremities: no LEE Pulses: 2+ and symmetric Skin: Warm and dry Neurologic: Grossly normal  Lab Results:   Recent Labs  12/09/14 0342 12/10/14 0400 12/11/14 0304  WBC 6.6 7.2 7.6  HGB 9.3* 8.4* 9.4*  HCT 28.6* 26.1* 29.4*  PLT 182 230 261   BMET  Recent Labs  12/09/14 0342 12/10/14 0400 12/11/14 0304  NA 138 140 142  K 4.1 3.7 3.9  CL 108 108 109  CO2 24 27 26   GLUCOSE 294* 259* 109*  BUN <5* <5* <5*  CREATININE 0.79 0.78 0.75  CALCIUM 8.3* 8.5 9.0   PT/INR  Recent Labs  12/10/14 0800 12/11/14 0304  LABPROT 13.5 14.6  INR 1.02 1.13   Cholesterol  Recent Labs  12/10/14 0400  CHOL 101   Lipid Panel     Component Value Date/Time   CHOL  101 12/10/2014 0400   TRIG 82 12/10/2014 0400   HDL 34* 12/10/2014 0400   CHOLHDL 3.0 12/10/2014 0400   VLDL 16 12/10/2014 0400   LDLCALC 51 12/10/2014 0400       Assessment/Plan   1. Sepsis: Hemodynamically stable. Afebrile. nl WBC. Off abx.  2. DKA: Glucoses trending in 200's. Per IM.  3. Type II NSTEMI/CAD: S/p PCI/DES of the LAD on 12/09/14. No chest pain overnight. Ambulated with cardiac rehab 700 feet.  Cont asa, statin, bb, plavix. P2Y12  elevated 259.Consider changing since it does not appear to be functioning.  Chronic warfarin anticoagulation s/p AVR.   4. S/P prosthetic AoV replacement: Nl valve fxn by echo this admission. Coumadin resumed with heparin bridge.HX of CVA. INR 1.13.  She can not afford the Lovenox($1300) and will ned to be here until therapeutic.   5. Acute on chronic combined systolic/diastolic.  Euvolemic on exam.  CHF: EF 45-50% by echo this admission.  Cont bb. BP has been trending in 140's. On low dose ACE.  Renal fxn has been stable since the 16th.  6. HTN: As above, BP trending in the 140's. Follow and consider adding acei given h/o DM with mild LV dysfxn.  7. HL: LDL 51. Cont statin. ALT mildly elevated on admission.  8. Normocytic anemia: Improved to 9.4  9. AKI: Resolved. Consider adding acei.   LOS: 8 days    HAGER, BRYAN PA-C 12/11/2014 7:19 AM   History and all data above reviewed.  Patient examined.  I agree with the findings as above.  No chest pain.  Breathing OK.  The patient exam reveals COR:RRR, mechanical S2  ,  Lungs: Clear  ,  Abd: Positive bowel sounds, no rebound no guarding, Ext No edema  .  All available labs, radiology testing, previous records reviewed. Agree with documented assessment and plan.  Repeat P2Y12.  Needs to stay in hospital until the INR is therapeutic.   12/13/2014  7:53 AM  12/11/2014

## 2014-12-11 NOTE — Progress Notes (Signed)
[facility] TEAM 1 - Stepdown/ICU TEAM Progress Note  Madison [name]:[id number] DOB: Jan 17, 1961 DOA: 12/03/2014 PCP: Janell Quiet, FNP  Admit HPI / Brief Narrative: 54 year old F HX type 1 diabetes mellitus on insulin pump until 2014 when she had a stroke , aortic valve replacement, hypertension, dyslipidemia, CVA, and noncompliance who was admitted 12/03/2014 after she presented to the emergency room with mental status changes and an initial blood pressure of 80/66.  Workup revealed diabetic ketoacidosis and a white count of 30,000. There was no obvious source of infection. Urinalysis was negative for nitrates and leukocyte esterase, chest x-ray did not reveal obvious infiltrate. She was covered with empiric IV antimicrobial therapy.  She was admitted to the intensive care unit. Her troponins increased to 5.63 for which Cardiology was consulted. Troponin elevation felt to be secondary to demand ischemia. Patient transferred to [facility] for heart cath and anticipated to be discharged from [facility] once INR therapeutic. In regard to diabetic ketoacidosis she was transitioned to basal insulin on 12/04/2014 with the discontinuation of IV insulin.   HPI/Subjective: Pt has no new complaints today.  She is tolerating her diet w/o difficulty.  She denies cp, sob, n/v, or abdom pain.    Assessment/Plan:  SIRS - Hypovolemic shock - resolved -Hypovolemic shock 2/2 dehydration -Discontinued all antibiotics secondary to not finding infectious source and patient's leukocytosis resolved  Diabetic ketoacidosis in uncontrolled DM Type 1 -DKA resolved by 12/04/2014 -CBG not at goal - adjust tx further today and follow  -A1c 9.0  NSTEMI / CAD S/P cardiac cath with DES stent to long lesion mid LAD 1/21 -care was provided by Cardiology - cont asa, statin, bb, plavix  Acute on chronic combined systolic/diastolic CHF -TTE 12/04/2014 showed EF 45-50% - initiated ACE this admit  -Continue  lovenox bridge to warfarin per pharmacy  Mechanical aortic valve replacement / Supratherapeutic INR upon admission (6.23) -Lovenox/warfarin per pharmacy and Cardiology  -INR goal 2-3  Rheumatoid arthritis  -quiescent   HTN -BP currently reasonably controlled - ACE initiated this admit   Hypokalemia -Potassium goal >4 -Magnesium goal>2 -Monitor potassium and magnesium   Acute kidney injury - resolved -Secondary to SIRS and hypovolemia as she initially presented with a creatinine of 2.06, now 0.76 -Resolved with administration of IV fluids  Code Status: FULL Family Communication: no family present at time of exam Disposition Plan: possible d/c next 24-48hrs if CBG controlled and pt able to obtain lovenox  Consultants: Cardiology PCCM  Procedures: 1/16 echocardiogram;- Left ventricle: mild focalbasal and severe concentric hypertrophy of the septum - LVEF= 45% to 50%. Moderate hypokinesis of mid-apicalanteroseptal myocardium - Pulmonary arteries: PA peak pressure: 31 mm Hg (S). 1/20 cardiac catheterization Severe one-vessel coronary artery disease;90% stenosis mid LAD. 1/21; Percutaneous coronary intervention MID LAD 90 % TUBULAR LESION -- Synergy DES 2.5 mm x 32 mm (2.8 mm)  Antibiotics: Zosyn 1/15 > 1/21 Vancomycin 1/16 > 1/17  DVT prophylaxis: IV Heparin > lovenox >  warfarin  Objective: Blood pressure 128/59, pulse 66, temperature 97.5 F (36.4 C), temperature source Oral, resp. rate 18, height 5\' 3"  (1.6 m), weight 77.5 kg (170 lb 13.7 oz), SpO2 98 %.  Intake/Output Summary (Last 24 hours) at 12/11/14 1406 Last data filed at 12/11/14 0750  Gross per 24 hour  Intake    900 ml  Output   2101 ml  Net  -1201 ml   Exam: General: No acute respiratory distress Lungs: Clear to auscultation bilaterally without wheezes or  crackles Cardiovascular: Regular rate and rhythm without murmur gallop or rub  Abdomen: Nontender, nondistended, soft, bowel sounds positive, no  rebound, no ascites, no appreciable mass Extremities: No significant cyanosis, clubbing, or edema bilateral lower extremities  Data Reviewed: Basic Metabolic Panel:  Recent Labs Lab 12/04/14 1630  12/05/14 0445  12/07/14 0500 12/08/14 0449 12/09/14 0342 12/10/14 0400 12/11/14 0304  NA 136  < >  --   < > 143 141 138 140 142  K 4.1  < >  --   < > 4.0 3.7 4.1 3.7 3.9  CL 113*  < >  --   < > 114* 110 108 108 109  CO2 16*  < >  --   < > 25 26 24 27 26   GLUCOSE 148*  < >  --   < > 107* 131* 294* 259* 109*  BUN 20  < >  --   < > 7 8 <5* <5* <5*  CREATININE 1.03  < >  --   < > 0.75 0.76 0.79 0.78 0.75  CALCIUM 7.1*  < >  --   < > 8.2* 8.3* 8.3* 8.5 9.0  MG 2.0  --  2.2  --   --   --   --  1.8  --   PHOS 2.9  --  2.6  --   --   --   --   --   --   < > = values in this interval not displayed.   Liver Function Tests:  Recent Labs Lab 12/05/14 0445 12/07/14 0500 12/08/14 0449 12/11/14 0304  AST 83* 90* 32 28  ALT 39* 74* 51* 31  ALKPHOS 61 90 84 74  BILITOT 0.6 0.7 0.4 0.4  PROT 4.8* 5.5* 5.5* 5.6*  ALBUMIN 2.6* 2.8* 2.6* 2.7*   CBC:  Recent Labs Lab 12/07/14 0500 12/08/14 0449 12/09/14 0342 12/10/14 0400 12/11/14 0304  WBC 6.1 5.7 6.6 7.2 7.6  HGB 9.1* 8.9* 9.3* 8.4* 9.4*  HCT 28.2* 28.2* 28.6* 26.1* 29.4*  MCV 86.8 88.1 85.4 85.0 86.5  PLT 177 197 182 230 261     Cardiac Enzymes:  Recent Labs Lab 12/04/14 2336 12/05/14 0445 12/05/14 1150 12/05/14 1752 12/11/14 0304  TROPONINI 15.04* 11.13* 6.55* 5.34* 0.37*   BNP (last 3 results)  Recent Labs  09/30/14 1431  PROBNP 794.3*   CBG:  Recent Labs Lab 12/10/14 1232 12/10/14 1726 12/10/14 2039 12/11/14 0648 12/11/14 1240  GLUCAP 283* 69* 101* 192* 349*    Recent Results (from the past 240 hour(s))  Culture, blood (routine x 2)     Status: None   Collection Time: 12/03/14  4:18 PM  Result Value Ref Range Status   Specimen Description BLOOD LEFT WRIST  Final   Special Requests BOTTLES DRAWN  AEROBIC ONLY  Final   Culture   Final    NO GROWTH 5 DAYS Performed at [facility]    Report Status 12/09/2014 FINAL  Final  Culture, blood (routine x 2)     Status: None   Collection Time: 12/03/14  4:20 PM  Result Value Ref Range Status   Specimen Description BLOOD RIGHT WRIST  Final   Special Requests BOTTLES DRAWN AEROBIC AND ANAEROBIC 1.5ML  Final   Culture   Final    NO GROWTH 5 DAYS Performed at [facility]    Report Status 12/09/2014 FINAL  Final  MRSA PCR Screening     Status: None   Collection Time:  12/03/14 11:41 PM  Result Value Ref Range Status   MRSA by PCR NEGATIVE NEGATIVE Final    Comment:        The GeneXpert MRSA Assay (FDA approved for NASAL specimens only), is one component of a comprehensive MRSA colonization surveillance program. It is not intended to diagnose MRSA infection nor to guide or monitor treatment for MRSA infections.      Studies:  Recent x-ray studies have been reviewed in detail by the Attending Physician  Scheduled Meds:  Scheduled Meds: . antiseptic oral rinse  7 mL Mouth Rinse q12n4p  . aspirin  81 mg Oral Daily  . atorvastatin  80 mg Oral q1800  . chlorhexidine  15 mL Mouth Rinse BID  . clopidogrel  75 mg Oral Q breakfast  . darifenacin  7.5 mg Oral Daily  . enoxaparin (LOVENOX) injection  80 mg Subcutaneous Q12H  . feeding supplement (GLUCERNA SHAKE)  237 mL Oral BID BM  . insulin aspart  0-15 Units Subcutaneous TID WC  . insulin aspart  3 Units Subcutaneous TID WC  . insulin detemir  26 Units Subcutaneous Daily  . lisinopril  2.5 mg Oral Daily  . metoprolol tartrate  12.5 mg Oral BID  . pantoprazole  40 mg Oral Daily  . warfarin  7.5 mg Oral ONCE-1800  . Warfarin - Pharmacist Dosing Inpatient   Does not apply q1800    Time spent on care of this patient: 25 mins  Lonia Blood, MD [facility] For Consults/Admissions - Flow Manager - [phone] Office  [phone]  Contact MD  directly via text page:      [email or web address]      password [facility]  12/11/2014, 2:06 PM   LOS: 8 days

## 2014-12-11 NOTE — Progress Notes (Signed)
Report to [employer] 6E. Patient will be transferred to med-surg bed. Patient notified.

## 2014-12-12 LAB — GLUCOSE, CAPILLARY
GLUCOSE-CAPILLARY: 177 mg/dL — AB (ref 70–99)
Glucose-Capillary: 282 mg/dL — ABNORMAL HIGH (ref 70–99)
Glucose-Capillary: 257 mg/dL — ABNORMAL HIGH (ref 70–99)
Glucose-Capillary: 112 mg/dL — ABNORMAL HIGH (ref 70–99)

## 2014-12-12 LAB — CBC
HEMATOCRIT: 30.5 % — AB (ref 36.0–46.0)
Hemoglobin: 9.6 g/dL — ABNORMAL LOW (ref 12.0–15.0)
MCH: 27.3 pg (ref 26.0–34.0)
MCHC: 31.5 g/dL (ref 30.0–36.0)
MCV: 86.6 fL (ref 78.0–100.0)
Platelets: 288 10*3/uL (ref 150–400)
RBC: 3.52 MIL/uL — ABNORMAL LOW (ref 3.87–5.11)
RDW: 18.5 % — ABNORMAL HIGH (ref 11.5–15.5)
WBC: 5.4 10*3/uL (ref 4.0–10.5)

## 2014-12-12 LAB — PROTIME-INR
INR: 1.25 (ref 0.00–1.49)
Prothrombin Time: 15.9 seconds — ABNORMAL HIGH (ref 11.6–15.2)

## 2014-12-12 MED ORDER — BACLOFEN 10 MG PO TABS
10.0000 mg | ORAL_TABLET | Freq: Two times a day (BID) | ORAL | Status: DC
  Administered 2014-12-12 – 2014-12-15 (×7): 10 mg via ORAL
  Filled 2014-12-12 (×8): qty 1

## 2014-12-12 MED ORDER — INSULIN DETEMIR 100 UNIT/ML ~~LOC~~ SOLN
16.0000 [IU] | Freq: Two times a day (BID) | SUBCUTANEOUS | Status: DC
  Administered 2014-12-12 – 2014-12-13 (×3): 16 [IU] via SUBCUTANEOUS
  Filled 2014-12-12 (×5): qty 0.16

## 2014-12-12 MED ORDER — ESCITALOPRAM OXALATE 20 MG PO TABS
20.0000 mg | ORAL_TABLET | Freq: Every day | ORAL | Status: DC
  Administered 2014-12-12 – 2014-12-15 (×4): 20 mg via ORAL
  Filled 2014-12-12 (×4): qty 1

## 2014-12-12 MED ORDER — WARFARIN SODIUM 10 MG PO TABS
10.0000 mg | ORAL_TABLET | Freq: Once | ORAL | Status: AC
  Administered 2014-12-12: 10 mg via ORAL
  Filled 2014-12-12: qty 1

## 2014-12-12 NOTE — Progress Notes (Signed)
[place] TEAM 1 - Stepdown/ICU TEAM Progress Note  Nava Hazelrigg KDX:[id number] DOB: 06-30-1961 DOA: 12/03/2014 PCP: Janell Quiet, FNP  Admit HPI / Brief Narrative: 54 year old F HX type 1 diabetes mellitus on insulin pump until 2014 when she had a stroke , aortic valve replacement, hypertension, dyslipidemia, CVA, and noncompliance who was admitted 12/03/2014 after she presented to the emergency room with mental status changes and an initial blood pressure of 80/66.  Workup revealed diabetic ketoacidosis and a white count of 30,000. There was no obvious source of infection. Urinalysis was negative for nitrates and leukocyte esterase, chest x-ray did not reveal obvious infiltrate. She was covered with empiric IV antimicrobial therapy.  She was admitted to the intensive care unit. Her troponins increased to 5.63 for which Cardiology was consulted. Troponin elevation felt to be secondary to demand ischemia. Patient transferred to [facility] for heart cath and anticipated to be discharged from [facility] once INR therapeutic. In regard to diabetic ketoacidosis she was transitioned to basal insulin on 12/04/2014 with the discontinuation of IV insulin.   HPI/Subjective: Pt has no new complaints today.  She denies cp, sob, n/v, or abdom pain.    Assessment/Plan:  SIRS - Hypovolemic shock - resolved -Hypovolemic shock 2/2 dehydration -Discontinued all antibiotics secondary to not finding infectious source and patient's leukocytosis resolved  Diabetic ketoacidosis in uncontrolled DM Type 1 -DKA resolved by 12/04/2014 -CBG at goal in evenings/borderline low, but high in mornings - change levemir to Q12 dosing and follow  -A1c 9.0  NSTEMI / CAD S/P cardiac cath with DES stent to long lesion mid LAD 1/21 -care was provided by Cardiology - cont asa, statin, bb - has been found to be a plavix non-responder so has been changed to effient   Acute on chronic combined systolic/diastolic  CHF -TTE 12/04/2014 showed EF 45-50% - initiated ACE this admit  -Continue lovenox bridge to warfarin per pharmacy  Mechanical aortic valve replacement / Supratherapeutic INR upon admission (6.23) -Lovenox/warfarin per pharmacy and Cardiology  -INR goal 2-3  Rheumatoid arthritis  -quiescent   HTN -BP currently reasonably controlled - ACE initiated this admit   Hypokalemia -Potassium goal >4 -Magnesium goal>2 -Monitor potassium and magnesium   Acute kidney injury - resolved -Secondary to SIRS and hypovolemia as she initially presented with a creatinine of 2.06, now 0.76 -Resolved with administration of IV fluids  Code Status: FULL Family Communication: no family present at time of exam Disposition Plan: possible d/c next 24hrs if CBG controlled and pt able to obtain lovenox  Consultants: Cardiology PCCM  Procedures: 1/16 echocardiogram;- Left ventricle: mild focalbasal and severe concentric hypertrophy of the septum - LVEF= 45% to 50%. Moderate hypokinesis of mid-apicalanteroseptal myocardium - Pulmonary arteries: PA peak pressure: 31 mm Hg (S). 1/20 cardiac catheterization Severe one-vessel coronary artery disease;90% stenosis mid LAD. 1/21; Percutaneous coronary intervention MID LAD 90 % TUBULAR LESION -- Synergy DES 2.5 mm x 32 mm (2.8 mm)  Antibiotics: Zosyn 1/15 > 1/21 Vancomycin 1/16 > 1/17  DVT prophylaxis: IV Heparin > lovenox >  warfarin  Objective: Blood pressure 115/47, pulse 67, temperature 97.9 F (36.6 C), temperature source Oral, resp. rate 18, height 5\' 3"  (1.6 m), weight 77.7 kg (171 lb 4.8 oz), SpO2 99 %.  Intake/Output Summary (Last 24 hours) at 12/12/14 1442 Last data filed at 12/12/14 1419  Gross per 24 hour  Intake    840 ml  Output      0 ml  Net  840 ml   Exam: General: No acute respiratory distress Lungs: Clear to auscultation bilaterally without wheezes or crackles Cardiovascular: Regular rate and rhythm without murmur gallop or  rub  Abdomen: Nontender, nondistended, soft, bowel sounds positive, no rebound Extremities: No significant cyanosis, clubbing, or edema bilateral lower extremities  Data Reviewed: Basic Metabolic Panel:  Recent Labs Lab 12/07/14 0500 12/08/14 0449 12/09/14 0342 12/10/14 0400 12/11/14 0304  NA 143 141 138 140 142  K 4.0 3.7 4.1 3.7 3.9  CL 114* 110 108 108 109  CO2 25 26 24 27 26   GLUCOSE 107* 131* 294* 259* 109*  BUN 7 8 <5* <5* <5*  CREATININE 0.75 0.76 0.79 0.78 0.75  CALCIUM 8.2* 8.3* 8.3* 8.5 9.0  MG  --   --   --  1.8  --      Liver Function Tests:  Recent Labs Lab 12/07/14 0500 12/08/14 0449 12/11/14 0304  AST 90* 32 28  ALT 74* 51* 31  ALKPHOS 90 84 74  BILITOT 0.7 0.4 0.4  PROT 5.5* 5.5* 5.6*  ALBUMIN 2.8* 2.6* 2.7*   CBC:  Recent Labs Lab 12/08/14 0449 12/09/14 0342 12/10/14 0400 12/11/14 0304 12/12/14 0515  WBC 5.7 6.6 7.2 7.6 5.4  HGB 8.9* 9.3* 8.4* 9.4* 9.6*  HCT 28.2* 28.6* 26.1* 29.4* 30.5*  MCV 88.1 85.4 85.0 86.5 86.6  PLT 197 182 230 261 288     Cardiac Enzymes:  Recent Labs Lab 12/05/14 1752 12/11/14 0304  TROPONINI 5.34* 0.37*   BNP (last 3 results)  Recent Labs  09/30/14 1431  PROBNP 794.3*   CBG:  Recent Labs Lab 12/11/14 1240 12/11/14 1616 12/11/14 2127 12/12/14 0726 12/12/14 1106  GLUCAP 349* 153* 75 282* 257*    Recent Results (from the past 240 hour(s))  Culture, blood (routine x 2)     Status: None   Collection Time: 12/03/14  4:18 PM  Result Value Ref Range Status   Specimen Description BLOOD LEFT WRIST  Final   Special Requests BOTTLES DRAWN AEROBIC ONLY 12/05/14  Final   Culture   Final    NO GROWTH 5 DAYS Performed at    Report Status 12/09/2014 FINAL  Final  Culture, blood (routine x 2)     Status: None   Collection Time: 12/03/14  4:20 PM  Result Value Ref Range Status   Specimen Description BLOOD RIGHT WRIST  Final   Special Requests BOTTLES DRAWN AEROBIC AND ANAEROBIC  1.5ML  Final   Culture   Final    NO GROWTH 5 DAYS Performed at 12/05/14    Report Status 12/09/2014 FINAL  Final  MRSA PCR Screening     Status: None   Collection Time: 12/03/14 11:41 PM  Result Value Ref Range Status   MRSA by PCR NEGATIVE NEGATIVE Final    Comment:        The GeneXpert MRSA Assay (FDA approved for NASAL specimens only), is one component of a comprehensive MRSA colonization surveillance program. It is not intended to diagnose MRSA infection nor to guide or monitor treatment for MRSA infections.      Studies:  Recent x-ray studies have been reviewed in detail by the Attending Physician  Scheduled Meds:  Scheduled Meds: . antiseptic oral rinse  7 mL Mouth Rinse q12n4p  . aspirin  81 mg Oral Daily  . atorvastatin  80 mg Oral q1800  . baclofen  10 mg Oral BID  . chlorhexidine  15 mL Mouth  Rinse BID  . clopidogrel  75 mg Oral Q breakfast  . darifenacin  7.5 mg Oral Daily  . enoxaparin (LOVENOX) injection  80 mg Subcutaneous Q12H  . escitalopram  20 mg Oral Daily  . feeding supplement (GLUCERNA SHAKE)  237 mL Oral BID BM  . insulin aspart  0-15 Units Subcutaneous TID WC  . insulin aspart  6 Units Subcutaneous TID WC  . insulin detemir  30 Units Subcutaneous Daily  . lisinopril  2.5 mg Oral Daily  . metoprolol tartrate  12.5 mg Oral BID  . pantoprazole  40 mg Oral Daily  . warfarin  10 mg Oral ONCE-1800  . Warfarin - Pharmacist Dosing Inpatient   Does not apply q1800    Time spent on care of this patient: 25 mins  Lonia Blood, MD [facility] For Consults/Admissions - Flow Manager - [phone] Office  [phone]  Contact MD directly via text page:      [email or web address]      password [facility]  12/12/2014, 2:42 PM   LOS: 9 days

## 2014-12-12 NOTE — Progress Notes (Addendum)
    SUBJECTIVE:  No chest pain.  No SOB.     PHYSICAL EXAM Filed Vitals:   12/11/14 0749 12/11/14 1618 12/11/14 2118 12/12/14 0541  BP: 128/59 101/45 101/46 114/47  Pulse: 66 61 68 74  Temp: 97.5 F (36.4 C) 98.6 F (37 C) 98.4 F (36.9 C) 98.2 F (36.8 C)  TempSrc: Oral Oral Oral Oral  Resp: 18 18 16 16   Height:      Weight:   171 lb 4.8 oz (77.7 kg)   SpO2: 98% 97% 99% 97%   General:  No distress Lungs:  Clear Heart:  RRR, mechanical S2 Abdomen:  Positive bowel sounds, no rebound no guarding Extremities:  No edema   LABS:  Results for orders placed or performed during the hospital encounter of 12/03/14 (from the past 24 hour(s))  Glucose, capillary     Status: Abnormal   Collection Time: 12/11/14 12:40 PM  Result Value Ref Range   Glucose-Capillary 349 (H) 70 - 99 mg/dL  Glucose, capillary     Status: Abnormal   Collection Time: 12/11/14  4:16 PM  Result Value Ref Range   Glucose-Capillary 153 (H) 70 - 99 mg/dL  Glucose, capillary     Status: None   Collection Time: 12/11/14  9:27 PM  Result Value Ref Range   Glucose-Capillary 75 70 - 99 mg/dL  Protime-INR     Status: Abnormal   Collection Time: 12/12/14  5:15 AM  Result Value Ref Range   Prothrombin Time 15.9 (H) 11.6 - 15.2 seconds   INR 1.25 0.00 - 1.49  CBC     Status: Abnormal   Collection Time: 12/12/14  5:15 AM  Result Value Ref Range   WBC 5.4 4.0 - 10.5 K/uL   RBC 3.52 (L) 3.87 - 5.11 MIL/uL   Hemoglobin 9.6 (L) 12.0 - 15.0 g/dL   HCT 12/14/14 (L) 16.1 - 09.6 %   MCV 86.6 78.0 - 100.0 fL   MCH 27.3 26.0 - 34.0 pg   MCHC 31.5 30.0 - 36.0 g/dL   RDW 04.5 (H) 40.9 - 81.1 %   Platelets 288 150 - 400 K/uL  Glucose, capillary     Status: Abnormal   Collection Time: 12/12/14  7:26 AM  Result Value Ref Range   Glucose-Capillary 282 (H) 70 - 99 mg/dL    Intake/Output Summary (Last 24 hours) at 12/12/14 0949 Last data filed at 12/12/14 0920  Gross per 24 hour  Intake    600 ml  Output      0 ml  Net     600 ml    ASSESSMENT AND PLAN:  Mechanical AoV:  Plan heparin until INR greater than two.    ACUTE ON CHRONIC SYSTOLIC AND DIASTOLIC HF:  Seems to be euvolemic.  Continue current therapy.    HTN:  BP controlled.   AKI:  Resolved   CAD:  Status post DES to LAD.    Not responding to Plavix.  Switch to Effient.    12/14/14 12/12/2014 9:49 AM   I reviewed data from the European heart journal August 25th 2014 and Circ Cardiovascular intervention.  I will keep the patient on Plavix as this is recommended when triple therapy is necessary unless there has been stent thrombosis on Plavix (clinical failure).  Choice of antiplatelet therapy was not based on P2Y12 level.

## 2014-12-12 NOTE — Progress Notes (Signed)
ANTICOAGULATION CONSULT NOTE - Follow Up Consult  Pharmacy Consult for Lovenox/Coumadin Indication: mechanical AVR + CVA's  Allergies  Allergen Reactions  . Celebrex [Celecoxib] Rash  . Detrol [Tolterodine] Hives    Patient Measurements: Height: 5\' 3"  (160 cm) Weight: 171 lb 4.8 oz (77.7 kg) IBW/kg (Calculated) : 52.4 Heparin Dosing Weight:   Vital Signs: Temp: 98.2 F (36.8 C) (01/24 0541) Temp Source: Oral (01/24 0541) BP: 114/47 mmHg (01/24 0541) Pulse Rate: 74 (01/24 0541)  Labs:  Recent Labs  12/10/14 0045  12/10/14 0400 12/10/14 0800 12/11/14 0304 12/12/14 0515  HGB  --   < > 8.4*  --  9.4* 9.6*  HCT  --   --  26.1*  --  29.4* 30.5*  PLT  --   --  230  --  261 288  LABPROT  --   --   --  13.5 14.6 15.9*  INR  --   --   --  1.02 1.13 1.25  HEPARINUNFRC 0.15*  --   --  0.55  --   --   CREATININE  --   --  0.78  --  0.75  --   TROPONINI  --   --   --   --  0.37*  --   < > = values in this interval not displayed.  Estimated Creatinine Clearance: 80.2 mL/min (by C-G formula based on Cr of 0.75).   Assessment: 26 yoF with DM1 admitted with severe sepsis, acute organ dysfunction, DKA, NSTEMI. Has mechanical AVR with hx recurrent CVAs when INR subtherapeutic; on chronic warfarin plus low-dose ASA.  Anticoagulation: Heparin -> Lovenox/Warf bridge for mech AVR/hx CVA s/p cath and mid LAD DES placed. No bleeding, Hgb 9.6. Plts 288 ok. INR 1.25 today. INR goal 2.5-3.5, PTA dose: 5 mg daily except 7.5 mg on Wed with admit INR 6.23  ID: s/p r/o sepsis. WBC WNL, Afebrile/24h. Vanc 1/15>>1/18 Zosyn 1/15>>1/21 1/15 BCx2 - NEG  CV: NSTEMI (troponin peaked at 15), Cathed 1/20 and 1/21 w/ DES placed mid LAD. Checking P2Y12 to determine length of triple therapy. Mech AVR and hx CVA. EF 45-50% 12/04/14 (was 60-65% 03/09/14). Platelets function test: 284. BP low 114/47 this AM- on asa81, atorv80, plavix, lopressor, lisinopril  Endocrine: DM1 s/p DKA (A1c 9), CBGs 79-349 very  labile on SSI, Levemir 30/d increased  GI/Nutr: PPI  Neurology: Hx CVA, spastic hemiplegia  Nephrology: SCr and lytes WNL; darifenacin  Hematology / Oncology: Hb 9.6 - low stable, plt WNL; Pt has RA on MTX (on hold)  PTA Medication Issues: off MTX until infection resolves (takes for RA), Lexapro, Baclofen  Best Practices: Enox/warf, PPI  Goal of Therapy:  INR 2.5-3.5 Monitor platelets by anticoagulation protocol: Yes   Plan:  Lovenox 80 mg SQ q12h Coumadin 10 mg po x 1 tonight. CBC q72h, INR daily Resume home Lexapro and Baclofen?  Dantae Meunier S. 03/11/14, PharmD, BCPS Clinical Staff Pharmacist Pager [phone]  [phone] [place] 12/12/2014,8:28 AM

## 2014-12-13 DIAGNOSIS — A419 Sepsis, unspecified organism: Secondary | ICD-10-CM | POA: Diagnosis not present

## 2014-12-13 DIAGNOSIS — R6520 Severe sepsis without septic shock: Secondary | ICD-10-CM | POA: Diagnosis not present

## 2014-12-13 LAB — CBC
HCT: 31.9 % — ABNORMAL LOW (ref 36.0–46.0)
Hemoglobin: 10 g/dL — ABNORMAL LOW (ref 12.0–15.0)
MCH: 27.2 pg (ref 26.0–34.0)
MCHC: 31.3 g/dL (ref 30.0–36.0)
MCV: 86.9 fL (ref 78.0–100.0)
Platelets: 326 10*3/uL (ref 150–400)
RBC: 3.67 MIL/uL — ABNORMAL LOW (ref 3.87–5.11)
RDW: 18 % — AB (ref 11.5–15.5)
WBC: 5.1 10*3/uL (ref 4.0–10.5)

## 2014-12-13 LAB — PROTIME-INR
INR: 1.5 — AB (ref 0.00–1.49)
Prothrombin Time: 18.3 seconds — ABNORMAL HIGH (ref 11.6–15.2)

## 2014-12-13 LAB — GLUCOSE, CAPILLARY
GLUCOSE-CAPILLARY: 121 mg/dL — AB (ref 70–99)
GLUCOSE-CAPILLARY: 111 mg/dL — AB (ref 70–99)
Glucose-Capillary: 57 mg/dL — ABNORMAL LOW (ref 70–99)
Glucose-Capillary: 71 mg/dL (ref 70–99)
Glucose-Capillary: 89 mg/dL (ref 70–99)

## 2014-12-13 MED ORDER — INSULIN DETEMIR 100 UNIT/ML ~~LOC~~ SOLN: 16.0000 [IU] | Freq: Two times a day (BID) | SUBCUTANEOUS | Status: DC

## 2014-12-13 MED ORDER — INSULIN DETEMIR 100 UNIT/ML ~~LOC~~ SOLN
8.0000 [IU] | Freq: Once | SUBCUTANEOUS | Status: AC
  Administered 2014-12-13: 8 [IU] via SUBCUTANEOUS
  Filled 2014-12-13: qty 0.08

## 2014-12-13 MED ORDER — INSULIN DETEMIR 100 UNIT/ML ~~LOC~~ SOLN
16.0000 [IU] | Freq: Two times a day (BID) | SUBCUTANEOUS | Status: DC
  Administered 2014-12-14 (×2): 16 [IU] via SUBCUTANEOUS
  Filled 2014-12-13 (×3): qty 0.16

## 2014-12-13 MED ORDER — INSULIN ASPART 100 UNIT/ML ~~LOC~~ SOLN
0.0000 [IU] | SUBCUTANEOUS | Status: DC
  Administered 2014-12-14 (×3): 5 [IU] via SUBCUTANEOUS
  Administered 2014-12-14: 2 [IU] via SUBCUTANEOUS

## 2014-12-13 MED ORDER — WARFARIN SODIUM 10 MG PO TABS
10.0000 mg | ORAL_TABLET | Freq: Once | ORAL | Status: AC
  Administered 2014-12-13: 10 mg via ORAL
  Filled 2014-12-13: qty 1

## 2014-12-13 MED FILL — Sodium Chloride IV Soln 0.9%: INTRAVENOUS | Qty: 50 | Status: AC

## 2014-12-13 NOTE — Progress Notes (Signed)
Physical Therapy Treatment Patient Details Name: Madison [name] MRN: [id number] DOB: [birth year] Today's Date: 12/13/2014    History of Present Illness 54 y.o. female with h/o type 1 DM, multiple CVA with L HP, HTN, aortic valve replacement admitted with AMS, sepsis, DKA.  Pt to undergo a cardiac [facility] at [facility] on 1/20.    PT Comments    Patient has met all acute PT goals.  Able to ambulate 560' with no assistive device with good balance.  Completed stair training, negotiating 7 steps with min guard assist.  No further acute PT needs identified - PT will sign off.  Encouraged ambulation in hallway with nursing.  Recommend HHPT for home safety evaluation and f/u as needed.   Follow Up Recommendations  Home health PT (for home safety evaluation)     Equipment Recommendations  None recommended by PT    Recommendations for Other Services       Precautions / Restrictions Precautions Precautions: Fall Restrictions Weight Bearing Restrictions: No    Mobility  Bed Mobility Overal bed mobility: Modified Independent             General bed mobility comments: Use of rail.  Increased time  Transfers Overall transfer level: Modified independent Equipment used: None Transfers: Sit to/from Stand Sit to Stand: Modified independent (Device/Increase time)         General transfer comment: Patient with good balance during transitions.  Ambulation/Gait Ambulation/Gait assistance: Supervision Ambulation Distance (Feet): 560 Feet Assistive device: None Gait Pattern/deviations: Step-through pattern ("hemi gait") Gait velocity: decreased Gait velocity interpretation: Below normal speed for age/gender General Gait Details: Patient with good balance during gait.  Hemiparetic gait pattern from prior CVA.     Stairs Stairs: Yes Stairs assistance: Min guard Stair Management: One rail Right;Step to pattern;Forwards Number of Stairs: 7 General stair comments: Verbal cues for technique  for use of 1 rail and step-to pattern.  Patient able to complete with min guard assist.  Wheelchair Mobility    Modified Rankin (Stroke Patients Only)       Balance                                    Cognition Arousal/Alertness: Awake/alert Behavior During Therapy: WFL for tasks assessed/performed Overall Cognitive Status: Within Functional Limits for tasks assessed                      Exercises      General Comments        Pertinent Vitals/Pain Pain Assessment: No/denies pain    Home Living                      Prior Function            PT Goals (current goals can now be found in the care plan section) Progress towards PT goals: Goals met/education completed, patient discharged from PT    Frequency  Min 3X/week    PT Plan Current plan remains appropriate    Co-evaluation             End of Session Equipment Utilized During Treatment: Gait belt Activity Tolerance: Patient tolerated treatment well Patient left: in bed;with call bell/phone within reach;with bed alarm set     Time: 1027-2536 PT Time Calculation (min) (ACUTE ONLY): 24 min  Charges:  $Gait Training: 23-37 mins  G Codes:      Despina Pole 12/13/2014, 3:38 PM Carita Pian. Sanjuana Kava, [place] Pager [phone]

## 2014-12-13 NOTE — Telephone Encounter (Signed)
Left message for pt to call back  °

## 2014-12-13 NOTE — Progress Notes (Addendum)
Inpatient Diabetes Program Recommendations  AACE/ADA: New Consensus Statement on Inpatient Glycemic Control (2013)  Target Ranges:  Prepandial:   less than 140 mg/dL      Peak postprandial:   less than 180 mg/dL (1-2 hours)      Critically ill patients:  140 - 180 mg/dL   Results for Madison [name], Madison [name] (MRN [id number]) as of 12/13/2014 10:47  Ref. Range 12/12/2014 07:26 12/12/2014 11:06 12/12/2014 16:55 12/12/2014 21:46 12/13/2014 08:22 12/13/2014 09:25  Glucose-Capillary Latest Range: 70-99 mg/dL 297 (H) 989 (H) 211 (H) 112 (H) 57 (L) 121 (H)   Current orders for Inpatient glycemic control: Levemir 16 units BID, Novolog 0-15 units TID with meals, Novolog 6 units TID with meals   Inpatient Diabetes Program Recommendations Insulin - Basal: In reviewing the chart, noted patient received Levemir 30 units at 11:05 on 1/24, Levemir 16 units at 17:15 on 1/24, and Levemir 16 units at 21:59 on 1/24 (total of Levemir 62 units on 1/24). Noted Levemir was changed to BID dosing and new order was started on 12/12/14 and as a result patient received too much basal insulin on 12/12/14 and fasting glucose 57 mg/dl this morning. Noted patient has already received Lantus 16 units this morning at 9:58. Patient is at risk for hypoglycemia over the next 24 hours. May want to consider not giving an evening dose of Levemir today and resume Levemir 16 units BID on 12/14/13.  Insulin-correction: May want to consider ordering CBGs Q4H to closely monitor glucose.  Thanks, Orlando Penner, RN, MSN, CCRN, CDE Diabetes Coordinator Inpatient Diabetes Program [phone] (Team Pager) [phone] ([place] office) [phone] [facility] office)

## 2014-12-13 NOTE — Telephone Encounter (Signed)
Pt currently admitted.

## 2014-12-13 NOTE — Care Management Note (Signed)
CARE MANAGEMENT NOTE 12/13/2014  Patient:  Madison [name], Madison [name]   Account Number:  [id number]  Date Initiated:  12/07/2014  Documentation initiated by:  Edwyna Shell  Subjective/Objective Assessment:   54 yo female admitted with altered mental status and DKA from home     Action/Plan:   discharge planning  12/13/14 Met with pt and discussed cost of Lovenox and generic. Pt states that she is unable to pay for this medication.   Anticipated DC Date:  12/14/2014   Anticipated DC Plan:  [place]  CM consult  Medication Assistance      [facility] Choice  HOME HEALTH   Choice offered to / List presented to:  C-1 Patient        [place] arranged  HH-1 RN  HH-10 DISEASE MANAGEMENT  HH-2 PT  HH-3 OT  [place]      [place].   Status of service:  Completed, signed off Medicare Important Message given?  YES (If response is "NO", the following Medicare IM given date fields will be blank) Date Medicare IM given:  12/07/2014 Medicare IM given by:  Edwyna Shell Date Additional Medicare IM given:  12/09/2014 Additional Medicare IM given by:  CRYSTAL HUTCHINSON  Discharge Disposition:  [place]  Per UR Regulation:  Reviewed for med. necessity/level of care/duration of stay  If discussed at [place] of Stay Meetings, dates discussed:   12/09/2014    Comments:  12/13/2014 Please see noted above, as pt is unable to purchase Lovenox or generic Lovenox due to minimal of $300 for the generic. Pt is not eligible for medication assistance as she has insurance. Please consider alternative treatments.  CRoyal RN MPH, case manager, [phone] IM given again and explained on 12/13/2014.   Crystal Hutchinson RN, BSN, MSHL, CCM  Nurse - Case Manager,  (Unit [phone]  12/10/2014 Lovenox update: per ---12/10/2014 1450 by Adrian Saran--- [place] at [phone]. Talked to Tracy (Ref  # [id number]). Ed stated that LOVENOX is not covered under patient's prescription plan. Patient would have to pay the full price of $1324.96 for LOVENOX. The generic ENOXAPARIN SODIUM is covered by [facility]. Patient's $300.00 Prescription deductible would have to be met first. After the deductible is met, the generic's co-pay would be $1.20. These medications are TIER 4 Level.   Crystal Hutchinson RN, BSN, MSHL, CCM  Nurse - Case Manager,  (Unit [phone]  12/10/2014 Please assure pt can obtain and afford lovenox from her preferred pharmacy - will likely require 5 - 7 days of tx w/ dose to be determined per Pharmacy ---12/10/2014 1111 by Mariann Laster--- [facility] Benefits check:  request sent. Lovenox 68m 2 times / day x 5-7 days status post  prosthetic AoV replacement Coverage, co-pay, authorization, deductibles, pharmacy. Thanks, CMariann LasterRN, BSN, MSHL, CCM  Nurse - Case Manager,  (Unit [phone] 12/10/2014     CMariann LasterRN, BSN, MSHL, CCM  Nurse - Case Manager,  (Unit [place] ([phone] 12/09/2014 Home with HHS:  RN, PT, OT, SW Hx/o 2 admissions and 2 ER visit over the past 6 months. 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  Please provide community resources and encourage compliance with completing paperwork, meeting deadlines in order to provide services needed. PCP:  Dr. Wynetta Emery ([facility] notified)   12/07/14 Edwyna Shell RN BSN CM [phone] Patient lives at home with her 9 year old son. Her sister and a friend drives them to appointments. Patient stated she has a walker, cane, wheelchair and BSC on house. Discussed ordered [facility] services and patient stated that she has used [facility] in the past and would like to have them provide the [facility] services again. She also requested help with Medicaid. She stated that she had Medicaid with [facility] and then moved  into a new apartment and was late in receiving and returning the required documentation. Encouraged the patient to contact DSS to follow up on status of application, and patient stated she has the number.

## 2014-12-13 NOTE — Progress Notes (Signed)
CARDIAC REHAB PHASE I   PRE:  Rate/Rhythm: not on monitor  BP:  Supine:   Sitting: 107/62  Standing:    SaO2: 98%RA  MODE:  Ambulation: 920 ft   POST:  Rate/Rhythm: 96 per pulse ox  BP:  Supine:   Sitting: 115/55  Standing:    SaO2: 100%RA 1130-1200 Pt walked 920 ft with asst x1 with steady gait. Said good to be up. Tolerated well. Asked NT to try to find recliner so pt can sit up. Bed alarm on so reset after walk. No DOE.   Luetta Nutting, RN BSN  12/13/2014 11:55 AM

## 2014-12-13 NOTE — Progress Notes (Signed)
ANTICOAGULATION CONSULT NOTE - Follow Up Consult  Pharmacy Consult for Lovenox/Coumadin Indication: mechanical AVR + CVA's  Allergies  Allergen Reactions  . Celebrex [Celecoxib] Rash  . Detrol [Tolterodine] Hives    Patient Measurements: Height: 5\' 3"  (160 cm) Weight: 169 lb 15.6 oz (77.1 kg) IBW/kg (Calculated) : 52.4 Heparin Dosing Weight:   Vital Signs: Temp: 97.2 F (36.2 C) (01/25 0449) Temp Source: Oral (01/25 0449) BP: 112/51 mmHg (01/25 0449) Pulse Rate: 60 (01/25 0449)  Labs:  Recent Labs  12/11/14 0304 12/12/14 0515 12/13/14 0520  HGB 9.4* 9.6* 10.0*  HCT 29.4* 30.5* 31.9*  PLT 261 288 326  LABPROT 14.6 15.9* 18.3*  INR 1.13 1.25 1.50*  CREATININE 0.75  --   --   TROPONINI 0.37*  --   --     Estimated Creatinine Clearance: 80 mL/min (by C-G formula based on Cr of 0.75).   Assessment: 64 YOF with DM1 admitted with severe sepsis, acute organ dysfunction, DKA, NSTEMI. Has mechanical AVR with hx recurrent CVAs when INR subtherapeutic. New DES stent placed to the mid-LAD this admit - patient on triple therapy with bASA + warfarin + plavix. Also being bridged with lovenox while INR <2.5. Per cardiology, to continue with plavix despite high PRU unless clinical failure seen on plavix (i.e. stent thrombosis).  The patient continues on warfarin + lovenox bridge for mechanical AVR with a SUBtherapeutic INR today though trending up (INR 1.5 << 1.25, goal of 2.5-3.5). It is noted plans for possible discharge today. Will plan on giving one more booster dose of warfarin of 10 mg - then would recommend the patient be discharged out on 7.5 mg daily with an INR check no later than Wednesday, 1/27. The patient should continue on lovenox for bridging until INR>/= 2.5.   Goal of Therapy:  INR 2.5-3.5 Monitor platelets by anticoagulation protocol: Yes  javascript:; Plan:  1. Continue Lovenox 80 mg SQ every 12 hours 2. Warfarin 10 mg x 1 dose at 1800 today (if still here) 3.  See discharge recommendations above 4. Will continue to monitor for any signs/symptoms of bleeding and will follow up with PT/INR in the a.m.   02-21-1992, PharmD, BCPS Clinical Pharmacist Pager: [phone] 12/13/2014 8:29 AM

## 2014-12-13 NOTE — Progress Notes (Addendum)
[place] TEAM 1 - Stepdown/ICU TEAM Progress Note  Madison [name] RUE:[id number] DOB: 1961-08-29 DOA: 12/03/2014 PCP: Janell Quiet, FNP  Admit HPI / Brief Narrative: 54 year old F HX type 1 diabetes mellitus on insulin pump until 2014 when she had a stroke , aortic valve replacement, hypertension, dyslipidemia, CVA, and noncompliance who was admitted 12/03/2014 after she presented to the emergency room with mental status changes and an initial blood pressure of 80/66.  Workup revealed diabetic ketoacidosis and a white count of 30,000. There was no obvious source of infection. Urinalysis was negative for nitrates and leukocyte esterase, chest x-ray did not reveal obvious infiltrate. She was covered with empiric IV antimicrobial therapy.  She was admitted to the intensive care unit. Her troponins increased to 5.63 for which Cardiology was consulted. Troponin elevation felt to be secondary to demand ischemia. Patient transferred to [facility] for heart cath and anticipated to be discharged from [facility] once INR therapeutic. In regard to diabetic ketoacidosis she was transitioned to basal insulin on 12/04/2014 with the discontinuation of IV insulin.   HPI/Subjective: Pt has no new complaints today.  She denies cp, sob, n/v, or abdom pain.  She has been ambulating w/ Cardiac Rehab.    Assessment/Plan:  SIRS - Hypovolemic shock - resolved -Hypovolemic shock 2/2 dehydration -Discontinued all antibiotics secondary to not finding infectious source and patient's leukocytosis resolved  Diabetic ketoacidosis in uncontrolled DM Type 1 -DKA resolved by 12/04/2014 -transitioned pt to BID dosing of levemir yesterday, but order resulted in pt receiving 3 doses of levemir yesterday - this morning she experienced some moderate hypoglycemia - will cut tonights levemir in half, and resume intended levimir dose tomorrow - d/c home when CBG stable  -A1c 9.0  NSTEMI / CAD S/P cardiac cath with DES  stent to long lesion mid LAD 1/21 -care was provided by Cardiology - cont asa, statin, bb - has been found to be a plavix non-responder so has been changed to effient   Acute on chronic combined systolic/diastolic CHF -TTE 12/04/2014 showed EF 45-50% - initiated ACE this admit  -well compensated at this time   Mechanical aortic valve replacement / Supratherapeutic INR upon admission (6.23) -Lovenox > warfarin bridge per pharmacy and Cardiology  -INR goal 2-3 -Cardiology suggests remaining in hospital until INR at goal, and pt unable to afford $300 copay for Rx lovenox regardless so must remain hospitalized    Rheumatoid arthritis  -quiescent   HTN -BP currently reasonably controlled - ACE initiated this admit   Hypokalemia -Potassium goal >4 -Magnesium goal>2 -Monitor potassium and magnesium   Acute kidney injury - resolved -Secondary to SIRS and hypovolemia as she initially presented with a creatinine of 2.06, now 0.76 -Resolved with administration of IV fluids  Code Status: FULL Family Communication: no family present at time of exam Disposition Plan: awaiting stable CBG and therapeutic INR for d/c home   Consultants: Cardiology PCCM  Procedures: 1/16 echocardiogram;- Left ventricle: mild focalbasal and severe concentric hypertrophy of the septum - LVEF= 45% to 50%. Moderate hypokinesis of mid-apicalanteroseptal myocardium - Pulmonary arteries: PA peak pressure: 31 mm Hg (S). 1/20 cardiac catheterization Severe one-vessel coronary artery disease;90% stenosis mid LAD. 1/21; Percutaneous coronary intervention MID LAD 90 % TUBULAR LESION -- Synergy DES 2.5 mm x 32 mm (2.8 mm)  Antibiotics: Zosyn 1/15 > 1/21 Vancomycin 1/16 > 1/17  DVT prophylaxis: IV Heparin > lovenox >  warfarin  Objective: Blood pressure 120/98, pulse 64, temperature 98 F (36.7 C),  temperature source Oral, resp. rate 17, height 5\' 3"  (1.6 m), weight 77.1 kg (169 lb 15.6 oz), SpO2 100  %.  Intake/Output Summary (Last 24 hours) at 12/13/14 1352 Last data filed at 12/13/14 0930  Gross per 24 hour  Intake    720 ml  Output      0 ml  Net    720 ml   Exam: General: No acute respiratory distress - mild chronic dysarthria  Lungs: Clear to auscultation bilaterally without wheezes or crackles Cardiovascular: Regular rate and rhythm without murmur gallop or rub  Abdomen: Nontender, nondistended, soft, bowel sounds positive, no rebound Extremities: No significant cyanosis, clubbing, or edema bilateral lower extremities  Data Reviewed: Basic Metabolic Panel:  Recent Labs Lab 12/07/14 0500 12/08/14 0449 12/09/14 0342 12/10/14 0400 12/11/14 0304  NA 143 141 138 140 142  K 4.0 3.7 4.1 3.7 3.9  CL 114* 110 108 108 109  CO2 25 26 24 27 26   GLUCOSE 107* 131* 294* 259* 109*  BUN 7 8 <5* <5* <5*  CREATININE 0.75 0.76 0.79 0.78 0.75  CALCIUM 8.2* 8.3* 8.3* 8.5 9.0  MG  --   --   --  1.8  --      Liver Function Tests:  Recent Labs Lab 12/07/14 0500 12/08/14 0449 12/11/14 0304  AST 90* 32 28  ALT 74* 51* 31  ALKPHOS 90 84 74  BILITOT 0.7 0.4 0.4  PROT 5.5* 5.5* 5.6*  ALBUMIN 2.8* 2.6* 2.7*   CBC:  Recent Labs Lab 12/09/14 0342 12/10/14 0400 12/11/14 0304 12/12/14 0515 12/13/14 0520  WBC 6.6 7.2 7.6 5.4 5.1  HGB 9.3* 8.4* 9.4* 9.6* 10.0*  HCT 28.6* 26.1* 29.4* 30.5* 31.9*  MCV 85.4 85.0 86.5 86.6 86.9  PLT 182 230 261 288 326     Cardiac Enzymes:  Recent Labs Lab 12/11/14 0304  TROPONINI 0.37*   BNP (last 3 results)  Recent Labs  09/30/14 1431  PROBNP 794.3*   CBG:  Recent Labs Lab 12/12/14 1655 12/12/14 2146 12/13/14 0822 12/13/14 0925 12/13/14 1239  GLUCAP 177* 112* 57* 121* 71    Recent Results (from the past 240 hour(s))  Culture, blood (routine x 2)     Status: None   Collection Time: 12/03/14  4:18 PM  Result Value Ref Range Status   Specimen Description BLOOD LEFT WRIST  Final   Special Requests BOTTLES DRAWN AEROBIC  ONLY  Final   Culture   Final    NO GROWTH 5 DAYS Performed at [facility]    Report Status 12/09/2014 FINAL  Final  Culture, blood (routine x 2)     Status: None   Collection Time: 12/03/14  4:20 PM  Result Value Ref Range Status   Specimen Description BLOOD RIGHT WRIST  Final   Special Requests BOTTLES DRAWN AEROBIC AND ANAEROBIC 1.5ML  Final   Culture   Final    NO GROWTH 5 DAYS Performed at [facility]    Report Status 12/09/2014 FINAL  Final  MRSA PCR Screening     Status: None   Collection Time: 12/03/14 11:41 PM  Result Value Ref Range Status   MRSA by PCR NEGATIVE NEGATIVE Final    Comment:        The GeneXpert MRSA Assay (FDA approved for NASAL specimens only), is one component of a comprehensive MRSA colonization surveillance program. It is not intended to diagnose MRSA infection nor to guide or monitor treatment for MRSA infections.  Studies:  Recent x-ray studies have been reviewed in detail by the Attending Physician  Scheduled Meds:  Scheduled Meds: . antiseptic oral rinse  7 mL Mouth Rinse q12n4p  . aspirin  81 mg Oral Daily  . atorvastatin  80 mg Oral q1800  . baclofen  10 mg Oral BID  . chlorhexidine  15 mL Mouth Rinse BID  . clopidogrel  75 mg Oral Q breakfast  . darifenacin  7.5 mg Oral Daily  . enoxaparin (LOVENOX) injection  80 mg Subcutaneous Q12H  . escitalopram  20 mg Oral Daily  . feeding supplement (GLUCERNA SHAKE)  237 mL Oral BID BM  . insulin aspart  0-15 Units Subcutaneous Q4H  . insulin aspart  6 Units Subcutaneous TID WC  . [START ON 12/14/2014] insulin detemir  16 Units Subcutaneous BID  . insulin detemir  8 Units Subcutaneous Once  . lisinopril  2.5 mg Oral Daily  . metoprolol tartrate  12.5 mg Oral BID  . pantoprazole  40 mg Oral Daily  . warfarin  10 mg Oral ONCE-1800  . Warfarin - Pharmacist Dosing Inpatient   Does not apply q1800    Time spent on care of this patient: 25 mins  Lonia Blood, MD [facility] For Consults/Admissions - Flow Manager - [phone] Office  [phone]  Contact MD directly via text page:      [email or web address]      password [email or web address]  12/13/2014, 1:52 PM   LOS: 10 days

## 2014-12-14 DIAGNOSIS — Z954 Presence of other heart-valve replacement: Secondary | ICD-10-CM | POA: Diagnosis not present

## 2014-12-14 DIAGNOSIS — M069 Rheumatoid arthritis, unspecified: Secondary | ICD-10-CM | POA: Diagnosis not present

## 2014-12-14 DIAGNOSIS — Z952 Presence of prosthetic heart valve: Secondary | ICD-10-CM | POA: Diagnosis not present

## 2014-12-14 DIAGNOSIS — I10 Essential (primary) hypertension: Secondary | ICD-10-CM | POA: Diagnosis not present

## 2014-12-14 DIAGNOSIS — I2510 Atherosclerotic heart disease of native coronary artery without angina pectoris: Secondary | ICD-10-CM | POA: Diagnosis not present

## 2014-12-14 DIAGNOSIS — E1065 Type 1 diabetes mellitus with hyperglycemia: Secondary | ICD-10-CM | POA: Diagnosis not present

## 2014-12-14 DIAGNOSIS — I214 Non-ST elevation (NSTEMI) myocardial infarction: Secondary | ICD-10-CM | POA: Diagnosis not present

## 2014-12-14 DIAGNOSIS — I5043 Acute on chronic combined systolic (congestive) and diastolic (congestive) heart failure: Secondary | ICD-10-CM | POA: Diagnosis present

## 2014-12-14 DIAGNOSIS — N179 Acute kidney failure, unspecified: Secondary | ICD-10-CM | POA: Diagnosis present

## 2014-12-14 DIAGNOSIS — E1010 Type 1 diabetes mellitus with ketoacidosis without coma: Secondary | ICD-10-CM | POA: Diagnosis not present

## 2014-12-14 DIAGNOSIS — R791 Abnormal coagulation profile: Secondary | ICD-10-CM | POA: Diagnosis not present

## 2014-12-14 DIAGNOSIS — I5023 Acute on chronic systolic (congestive) heart failure: Secondary | ICD-10-CM | POA: Diagnosis not present

## 2014-12-14 DIAGNOSIS — R6520 Severe sepsis without septic shock: Secondary | ICD-10-CM | POA: Diagnosis not present

## 2014-12-14 DIAGNOSIS — A419 Sepsis, unspecified organism: Secondary | ICD-10-CM | POA: Diagnosis not present

## 2014-12-14 LAB — COMPREHENSIVE METABOLIC PANEL
ALBUMIN: 2.9 g/dL — AB (ref 3.5–5.2)
ALT: 36 U/L — AB (ref 0–35)
AST: 37 U/L (ref 0–37)
Alkaline Phosphatase: 74 U/L (ref 39–117)
Anion gap: 11 (ref 5–15)
BILIRUBIN TOTAL: 0.4 mg/dL (ref 0.3–1.2)
BUN: 11 mg/dL (ref 6–23)
CALCIUM: 8.7 mg/dL (ref 8.4–10.5)
CHLORIDE: 104 mmol/L (ref 96–112)
CO2: 22 mmol/L (ref 19–32)
Creatinine, Ser: 0.76 mg/dL (ref 0.50–1.10)
GFR calc non Af Amer: >90 mL/min (ref >90)
Glucose, Bld: 276 mg/dL — ABNORMAL HIGH (ref 70–99)
POTASSIUM: 4 mmol/L (ref 3.5–5.1)
Sodium: 137 mmol/L (ref 135–145)
Total Protein: 6 g/dL (ref 6.0–8.3)

## 2014-12-14 LAB — GLUCOSE, CAPILLARY
GLUCOSE-CAPILLARY: 240 mg/dL — AB (ref 70–99)
GLUCOSE-CAPILLARY: 99 mg/dL (ref 70–99)
Glucose-Capillary: 249 mg/dL — ABNORMAL HIGH (ref 70–99)
Glucose-Capillary: 234 mg/dL — ABNORMAL HIGH (ref 70–99)
Glucose-Capillary: 149 mg/dL — ABNORMAL HIGH (ref 70–99)
Glucose-Capillary: 75 mg/dL (ref 70–99)
Glucose-Capillary: 237 mg/dL — ABNORMAL HIGH (ref 70–99)
Glucose-Capillary: 179 mg/dL — ABNORMAL HIGH (ref 70–99)

## 2014-12-14 LAB — CBC
HCT: 31 % — ABNORMAL LOW (ref 36.0–46.0)
HEMOGLOBIN: 9.8 g/dL — AB (ref 12.0–15.0)
MCH: 27.3 pg (ref 26.0–34.0)
MCHC: 31.6 g/dL (ref 30.0–36.0)
MCV: 86.4 fL (ref 78.0–100.0)
PLATELETS: 329 10*3/uL (ref 150–400)
RBC: 3.59 MIL/uL — ABNORMAL LOW (ref 3.87–5.11)
RDW: 17.4 % — ABNORMAL HIGH (ref 11.5–15.5)
WBC: 5.4 10*3/uL (ref 4.0–10.5)

## 2014-12-14 LAB — PROTIME-INR
INR: 1.65 — ABNORMAL HIGH (ref 0.00–1.49)
PROTHROMBIN TIME: 19.7 s — AB (ref 11.6–15.2)

## 2014-12-14 LAB — MAGNESIUM: Magnesium: 1.8 mg/dL (ref 1.5–2.5)

## 2014-12-14 MED ORDER — WARFARIN SODIUM 7.5 MG PO TABS
7.5000 mg | ORAL_TABLET | Freq: Once | ORAL | Status: AC
  Administered 2014-12-14: 7.5 mg via ORAL
  Filled 2014-12-14: qty 1

## 2014-12-14 MED ORDER — INSULIN ASPART 100 UNIT/ML ~~LOC~~ SOLN
0.0000 [IU] | Freq: Three times a day (TID) | SUBCUTANEOUS | Status: DC
  Administered 2014-12-15: 2 [IU] via SUBCUTANEOUS

## 2014-12-14 NOTE — Progress Notes (Signed)
[place] TEAM 1 - Stepdown/ICU TEAM Progress Note  Madison [name] ZOX:[id number] DOB: [birth year] DOA: 12/03/2014 PCP: Janell Quiet, FNP  Admit HPI / Brief Narrative: 54 year old with PMH of type 1 diabetes mellitus ( insulin pump d/c'ed 2014 when she had a stroke) , aortic valve replacement, hypertension, dyslipidemia, CVA, and noncompliance that presented to ED on 12/03/2014 with  mental status changes. Workup revealed elevated CBGS, blood pressure of 80/66, leukocytosis of  30, 000.  Pt was found in hypovolemic shock and in diabetic ketoacidosis. UA and CXR with no evidence of infection.  She was placed on empiric IV abx coverage, IV insulin and was admitted to ICU for further workup.  Pt was then transitioned off IV insulin to SQ insulin.   Pt's troponins increased to 5.63, Cardiology was consulted, and pt underwent PCI of LAD. Per Cards, home when INR >2    HPI/Subjective: Pt has no new complaints today. denies any cp or sob  Assessment/Plan:  Severe Sepsis -Resolved.  Pt was placed on IV abx and and rehydrated with IVF. With resolution of leukocytosis and no definite source of infection, abx were subsequently d/c'ed.  Diabetic ketoacidosis in uncontrolled DM Type 1 -CBGs stable -Pt transitioned from IV Insulin to Levemir 16U BID, Novolog 6U TID, and SSI -HgbA1c 9.0  NSTEMI / CAD -S/p cardiac cath with DES stent to long lesion mid LAD 1/21 -Cardiology on board- Cont asa, statin, bb, effient (switched from Plavix)  Acute on chronic combined systolic/diastolic CHF -Appears Euvolemic -TTE 12/04/2014 showed EF 45-50% - initiated ACE this admit   Mechanical aortic valve replacement / Supratherapeutic INR upon admission (6.23) -Lovenox > warfarin bridge per pharmacy and Cardiology  -INR goal 2-3 -Per Cardiology-  Remain in hospital until INR 2-3;  pt unable to afford $300 copay for Rx lovenox regardless  ADDENDUM; CSW Gerre Scull [phone] has determined away that [facility] can cover cost  of Lovenox at home. Contact in the a.m. and should be able to be discharged  Rheumatoid arthritis  -quiescent   HTN -BP currently reasonably controlled -cont Lisinopril   Hypokalemia -K normal -repeat BMET in am  Acute kidney injury  - resolved- creat 0.76- likely secondary to hypovolemia, treated with IVF   Code Status: FULL Family Communication: no family present at time of exam Disposition Plan: awaiting stable CBG and therapeutic INR for d/c home   Consultants: Cardiology PCCM  Procedures: 1/16 echocardiogram;- Left ventricle: mild focalbasal and severe concentric hypertrophy of the septum - LVEF= 45% to 50%. Moderate hypokinesis of mid-apicalanteroseptal myocardium - Pulmonary arteries: PA peak pressure: 31 mm Hg (S). 1/20 cardiac catheterization Severe one-vessel coronary artery disease;90% stenosis mid LAD. 1/21; Percutaneous coronary intervention MID LAD 90 % TUBULAR LESION -- Synergy DES 2.5 mm x 32 mm (2.8 mm)  Antibiotics: Zosyn 1/15 > 1/21 Vancomycin 1/16 > 1/17  DVT prophylaxis: IV Heparin > lovenox >  warfarin  Objective: Blood pressure 135/64, pulse 62, temperature 98.1 F (36.7 C), temperature source Oral, resp. rate 18, height 5\' 3"  (1.6 m), weight 76.5 kg (168 lb 10.4 oz), SpO2 95 %.  Intake/Output Summary (Last 24 hours) at 12/14/14 1729 Last data filed at 12/14/14 1721  Gross per 24 hour  Intake    600 ml  Output      4 ml  Net    596 ml   Exam: General: Alert and oriented Caucasian female in NAD Lungs: Clear to auscultation bilaterally without wheezes or crackles Cardiovascular: RRR, mechanical S2 Abdomen: Nontender, nondistended,  soft, bowel sounds positive, no rebound Extremities: No significant cyanosis, clubbing, or edema bilateral lower extremities   Data Reviewed: Basic Metabolic Panel:  Recent Labs Lab 12/08/14 0449 12/09/14 0342 12/10/14 0400 12/11/14 0304 12/14/14 0627  NA 141 138 140 142 137  K 3.7 4.1 3.7 3.9 4.0  CL  110 108 108 109 104  CO2 26 24 27 26 22   GLUCOSE 131* 294* 259* 109* 276*  BUN 8 <5* <5* <5* 11  CREATININE 0.76 0.79 0.78 0.75 0.76  CALCIUM 8.3* 8.3* 8.5 9.0 8.7  MG  --   --  1.8  --  1.8     Liver Function Tests:  Recent Labs Lab 12/08/14 0449 12/11/14 0304 12/14/14 0627  AST 32 28 37  ALT 51* 31 36*  ALKPHOS 84 74 74  BILITOT 0.4 0.4 0.4  PROT 5.5* 5.6* 6.0  ALBUMIN 2.6* 2.7* 2.9*   CBC:  Recent Labs Lab 12/10/14 0400 12/11/14 0304 12/12/14 0515 12/13/14 0520 12/14/14 0627  WBC 7.2 7.6 5.4 5.1 5.4  HGB 8.4* 9.4* 9.6* 10.0* 9.8*  HCT 26.1* 29.4* 30.5* 31.9* 31.0*  MCV 85.0 86.5 86.6 86.9 86.4  PLT 230 261 288 326 329     Cardiac Enzymes:  Recent Labs Lab 12/11/14 0304  TROPONINI 0.37*   BNP (last 3 results)  Recent Labs  09/30/14 1431  PROBNP 794.3*   CBG:  Recent Labs Lab 12/14/14 0705 12/14/14 0732 12/14/14 1014 12/14/14 1129 12/14/14 1257  GLUCAP 234* 240* 149* 99 75    No results found for this or any previous visit (from the past 240 hour(s)).   Studies:  Recent x-ray studies have been reviewed in detail by the Attending Physician  Scheduled Meds:  Scheduled Meds: . antiseptic oral rinse  7 mL Mouth Rinse q12n4p  . aspirin  81 mg Oral Daily  . atorvastatin  80 mg Oral q1800  . baclofen  10 mg Oral BID  . chlorhexidine  15 mL Mouth Rinse BID  . clopidogrel  75 mg Oral Q breakfast  . darifenacin  7.5 mg Oral Daily  . enoxaparin (LOVENOX) injection  80 mg Subcutaneous Q12H  . escitalopram  20 mg Oral Daily  . feeding supplement (GLUCERNA SHAKE)  237 mL Oral BID BM  . insulin aspart  0-15 Units Subcutaneous Q4H  . insulin aspart  6 Units Subcutaneous TID WC  . insulin detemir  16 Units Subcutaneous BID  . lisinopril  2.5 mg Oral Daily  . metoprolol tartrate  12.5 mg Oral BID  . pantoprazole  40 mg Oral Daily  . warfarin  7.5 mg Oral ONCE-1800  . Warfarin - Pharmacist Dosing Inpatient   Does not apply q1800    Time  spent on care of this patient: 25 mins  12/16/14 [facility] [phone]   Contact MD directly via text page:      [email or web address]      password [email or web address]  12/14/2014, 5:29 PM   LOS: 11 days    Examined Patient and discussed A&P with ANP Sahar and agree with above plan.  I have reviewed the entire database. I have made any necessary editorial changes, and agree with its content. I have reviewed this patient's available data, including medical history, events of note, physical examination, radiology studies and test results as part of my evaluation Pt with Multiple Complex medical problems> 35 min spent in direct Pt care   12/16/2014, MD [facility] [phone] pager

## 2014-12-14 NOTE — Progress Notes (Signed)
[facility] Care Management will cover patient's copay for Lovenox to help facilitate discharge. Called unit pharmacist to find out what home dosage would likely be as Lovenox 80 mg bid. Will make MD aware as well. Will leave voicemail for inpatient RNCM to make aware as well as it is after hours currently. [facility] to follow up in the morning with patient's outpatient pharmacy. Made patient aware as well.  Raiford Noble, MSN- RN,BSN- [facility] Liaison873 [phone]

## 2014-12-14 NOTE — Progress Notes (Signed)
ANTICOAGULATION CONSULT NOTE - Follow Up Consult  Pharmacy Consult for Coumadin Indication: Mechanical AVR + CVA's  Allergies  Allergen Reactions  . Celebrex [Celecoxib] Rash  . Detrol [Tolterodine] Hives    Patient Measurements: Height: 5\' 3"  (160 cm) Weight: 168 lb 10.4 oz (76.5 kg) IBW/kg (Calculated) : 52.4 Heparin Dosing Weight:   Vital Signs: Temp: 98.4 F (36.9 C) (01/26 0930) Temp Source: Oral (01/26 0930) BP: 128/77 mmHg (01/26 0930) Pulse Rate: 58 (01/26 0930)  Labs:  Recent Labs  12/12/14 0515 12/13/14 0520 12/14/14 0627  HGB 9.6* 10.0* 9.8*  HCT 30.5* 31.9* 31.0*  PLT 288 326 329  LABPROT 15.9* 18.3* 19.7*  INR 1.25 1.50* 1.65*  CREATININE  --   --  0.76    Estimated Creatinine Clearance: 79.6 mL/min (by C-G formula based on Cr of 0.76).   Medications:  Scheduled:  . antiseptic oral rinse  7 mL Mouth Rinse q12n4p  . aspirin  81 mg Oral Daily  . atorvastatin  80 mg Oral q1800  . baclofen  10 mg Oral BID  . chlorhexidine  15 mL Mouth Rinse BID  . clopidogrel  75 mg Oral Q breakfast  . darifenacin  7.5 mg Oral Daily  . enoxaparin (LOVENOX) injection  80 mg Subcutaneous Q12H  . escitalopram  20 mg Oral Daily  . feeding supplement (GLUCERNA SHAKE)  237 mL Oral BID BM  . insulin aspart  0-15 Units Subcutaneous Q4H  . insulin aspart  6 Units Subcutaneous TID WC  . insulin detemir  16 Units Subcutaneous BID  . lisinopril  2.5 mg Oral Daily  . metoprolol tartrate  12.5 mg Oral BID  . pantoprazole  40 mg Oral Daily  . Warfarin - Pharmacist Dosing Inpatient   Does not apply q1800    Assessment: 33 YOF with DM1 admitted with severe sepsis, acute organ dysfunction, DKA, NSTEMI. Has mechanical AVR with hx recurrent CVAs when INR subtherapeutic. New DES stent placed to the mid-LAD this admit - patient on triple therapy with bASA + warfarin + plavix. Also being bridged with lovenox while INR <2.5. Per cardiology, to continue with plavix despite high PRU  unless clinical failure seen on plavix (i.e. stent thrombosis)  INR 1.65 this AM, trending upward.  Hg is 9.8 and ~stable, pltc is wnl.  No bleeding problems noted.  All doses charted.  Goal of Therapy:  INR 2-3 Anti-Xa level 0.6-1 units/ml 4hrs after LMWH dose given Monitor platelets by anticoagulation protocol: Yes   Plan:  Coumadin 7.5mg  today Continue Lovenox 80mg  SQ q12 F/U INR in AM, CBC q72 while on Lovenox  40, PharmD Clinical Pharmacist [facility]

## 2014-12-14 NOTE — Progress Notes (Signed)
Subjective:  No CP/SOB  Objective:  Temp:  [97.6 F (36.4 C)-98.6 F (37 C)] 97.6 F (36.4 C) (01/26 0557) Pulse Rate:  [62-68] 68 (01/26 0557) Resp:  [16-18] 18 (01/26 0557) BP: (104-118)/(46-66) 118/56 mmHg (01/26 0557) SpO2:  [94 %-100 %] 94 % (01/26 0557) Weight:  [168 lb 10.4 oz (76.5 kg)] 168 lb 10.4 oz (76.5 kg) (01/25 2120) Weight change: -1 lb 5.2 oz (-0.6 kg)  Intake/Output from previous day: 01/25 0701 - 01/26 0700 In: 240 [P.O.:240] Out: -   Intake/Output from this shift:    Physical Exam: General appearance: alert and no distress Neck: no adenopathy, no carotid bruit, no JVD, supple, symmetrical, trachea midline and thyroid not enlarged, symmetric, no tenderness/mass/nodules Lungs: clear to auscultation bilaterally Heart: regular rate and rhythm, S1, S2 normal, no murmur, click, rub or gallop and Crisp AoV prosthetic VS Extremities: extremities normal, atraumatic, no cyanosis or edema  Lab Results: Results for orders placed or performed during the hospital encounter of 12/03/14 (from the past 48 hour(s))  Glucose, capillary     Status: Abnormal   Collection Time: 12/12/14 11:06 AM  Result Value Ref Range   Glucose-Capillary 257 (H) 70 - 99 mg/dL  Glucose, capillary     Status: Abnormal   Collection Time: 12/12/14  4:55 PM  Result Value Ref Range   Glucose-Capillary 177 (H) 70 - 99 mg/dL  Glucose, capillary     Status: Abnormal   Collection Time: 12/12/14  9:46 PM  Result Value Ref Range   Glucose-Capillary 112 (H) 70 - 99 mg/dL  CBC     Status: Abnormal   Collection Time: 12/13/14  5:20 AM  Result Value Ref Range   WBC 5.1 4.0 - 10.5 K/uL   RBC 3.67 (L) 3.87 - 5.11 MIL/uL   Hemoglobin 10.0 (L) 12.0 - 15.0 g/dL   HCT 31.9 (L) 36.0 - 46.0 %   MCV 86.9 78.0 - 100.0 fL   MCH 27.2 26.0 - 34.0 pg   MCHC 31.3 30.0 - 36.0 g/dL   RDW 18.0 (H) 11.5 - 15.5 %   Platelets 326 150 - 400 K/uL  Protime-INR     Status: Abnormal   Collection Time: 12/13/14   5:20 AM  Result Value Ref Range   Prothrombin Time 18.3 (H) 11.6 - 15.2 seconds   INR 1.50 (H) 0.00 - 1.49  Glucose, capillary     Status: Abnormal   Collection Time: 12/13/14  8:22 AM  Result Value Ref Range   Glucose-Capillary 57 (L) 70 - 99 mg/dL  Glucose, capillary     Status: Abnormal   Collection Time: 12/13/14  9:25 AM  Result Value Ref Range   Glucose-Capillary 121 (H) 70 - 99 mg/dL  Glucose, capillary     Status: None   Collection Time: 12/13/14 12:39 PM  Result Value Ref Range   Glucose-Capillary 71 70 - 99 mg/dL  Glucose, capillary     Status: Abnormal   Collection Time: 12/13/14  4:46 PM  Result Value Ref Range   Glucose-Capillary 111 (H) 70 - 99 mg/dL  Glucose, capillary     Status: None   Collection Time: 12/13/14  9:24 PM  Result Value Ref Range   Glucose-Capillary 89 70 - 99 mg/dL  Glucose, capillary     Status: Abnormal   Collection Time: 12/14/14  2:22 AM  Result Value Ref Range   Glucose-Capillary 249 (H) 70 - 99 mg/dL  Protime-INR     Status: Abnormal  Collection Time: 12/14/14  6:27 AM  Result Value Ref Range   Prothrombin Time 19.7 (H) 11.6 - 15.2 seconds   INR 1.65 (H) 0.00 - 1.49  Comprehensive metabolic panel     Status: Abnormal   Collection Time: 12/14/14  6:27 AM  Result Value Ref Range   Sodium 137 135 - 145 mmol/L   Potassium 4.0 3.5 - 5.1 mmol/L   Chloride 104 96 - 112 mmol/L   CO2 22 19 - 32 mmol/L   Glucose, Bld 276 (H) 70 - 99 mg/dL   BUN 11 6 - 23 mg/dL   Creatinine, Ser 0.76 0.50 - 1.10 mg/dL   Calcium 8.7 8.4 - 10.5 mg/dL   Total Protein 6.0 6.0 - 8.3 g/dL   Albumin 2.9 (L) 3.5 - 5.2 g/dL   AST 37 0 - 37 U/L   ALT 36 (H) 0 - 35 U/L   Alkaline Phosphatase 74 39 - 117 U/L   Total Bilirubin 0.4 0.3 - 1.2 mg/dL   GFR calc non Af Amer >90 >90 mL/min   GFR calc Af Amer >90 >90 mL/min    Comment: (NOTE) The eGFR has been calculated using the CKD EPI equation. This calculation has not been validated in all clinical  situations. eGFR's persistently <90 mL/min signify possible Chronic Kidney Disease.    Anion gap 11 5 - 15  Magnesium     Status: None   Collection Time: 12/14/14  6:27 AM  Result Value Ref Range   Magnesium 1.8 1.5 - 2.5 mg/dL  CBC     Status: Abnormal   Collection Time: 12/14/14  6:27 AM  Result Value Ref Range   WBC 5.4 4.0 - 10.5 K/uL   RBC 3.59 (L) 3.87 - 5.11 MIL/uL   Hemoglobin 9.8 (L) 12.0 - 15.0 g/dL   HCT 31.0 (L) 36.0 - 46.0 %   MCV 86.4 78.0 - 100.0 fL   MCH 27.3 26.0 - 34.0 pg   MCHC 31.6 30.0 - 36.0 g/dL   RDW 17.4 (H) 11.5 - 15.5 %   Platelets 329 150 - 400 K/uL  Glucose, capillary     Status: Abnormal   Collection Time: 12/14/14  7:05 AM  Result Value Ref Range   Glucose-Capillary 234 (H) 70 - 99 mg/dL  Glucose, capillary     Status: Abnormal   Collection Time: 12/14/14  7:32 AM  Result Value Ref Range   Glucose-Capillary 240 (H) 70 - 99 mg/dL   Comment 1 Notify RN    Comment 2 Documented in Chart     Imaging: Imaging results have been reviewed  Assessment/Plan:   1. Principal Problem: 2.   Severe sepsis with acute organ dysfunction 3. Active Problems: 4.   CAD (coronary artery disease) 5.   HTN (hypertension) 6.   Rheumatoid arthritis 7.   Type 1 diabetes mellitus with neurological manifestations 8.   AKI (acute kidney injury) 9.   Diabetic ketoacidosis without coma associated with type 1 diabetes mellitus 10.   Spastic hemiplegia affecting nondominant side 11.   Lactic acidosis 12.   Supratherapeutic INR 13.   NSTEMI (non-ST elevated myocardial infarction) 14.   Sepsis due to other etiology 15.   Diabetes type 1, uncontrolled 16.   Acute on chronic systolic CHF (congestive heart failure) 17.   S/P aortic valve replacement 18.   Hypokalemia 19.   Time Spent Directly with Patient:  20 minutes  Length of Stay:  LOS: 11 days    S/P NSTEMI  with DES to LAD by Dr [facility]. EF 45 % by 2D. She has a Financial trader and is getting lovenox and coumadin  per pharm. On DAPT (ASA /Plavix). Exam benign. VSS. Home when INR >2 on triple Rx. ROV with Dr. Johnsie Cancel.   Lorretta Harp 12/14/2014, 8:43 AM

## 2014-12-14 NOTE — Progress Notes (Signed)
CARDIAC REHAB PHASE I   PRE:  Rate/Rhythm: 73     BP: sitting 118/43    SaO2: 99 RA  MODE:  Ambulation: 920 ft   POST:  Rate/Rhythm: 98     BP: sitting 109/47     SaO2: 99 RA  Tolerated well. No c/o. Pt frustrated that she can't walk on her own and that nursing will not walk with her. To straight back chair (all that is in room). Doing well. [phone]   [place]ison [name] [place] CES, ACSM 12/14/2014 3:26 PM

## 2014-12-15 DIAGNOSIS — R6520 Severe sepsis without septic shock: Secondary | ICD-10-CM

## 2014-12-15 DIAGNOSIS — E1065 Type 1 diabetes mellitus with hyperglycemia: Secondary | ICD-10-CM | POA: Diagnosis not present

## 2014-12-15 DIAGNOSIS — I214 Non-ST elevation (NSTEMI) myocardial infarction: Secondary | ICD-10-CM | POA: Diagnosis not present

## 2014-12-15 DIAGNOSIS — E1010 Type 1 diabetes mellitus with ketoacidosis without coma: Secondary | ICD-10-CM | POA: Diagnosis not present

## 2014-12-15 DIAGNOSIS — A419 Sepsis, unspecified organism: Secondary | ICD-10-CM | POA: Diagnosis not present

## 2014-12-15 LAB — PROTIME-INR
INR: 1.94 — AB (ref 0.00–1.49)
Prothrombin Time: 22.3 seconds — ABNORMAL HIGH (ref 11.6–15.2)

## 2014-12-15 LAB — GLUCOSE, CAPILLARY
GLUCOSE-CAPILLARY: 149 mg/dL — AB (ref 70–99)
GLUCOSE-CAPILLARY: 59 mg/dL — AB (ref 70–99)
Glucose-Capillary: 61 mg/dL — ABNORMAL LOW (ref 70–99)
Glucose-Capillary: 101 mg/dL — ABNORMAL HIGH (ref 70–99)
Glucose-Capillary: 97 mg/dL (ref 70–99)

## 2014-12-15 LAB — BASIC METABOLIC PANEL
ANION GAP: 10 (ref 5–15)
BUN: 13 mg/dL (ref 6–23)
CHLORIDE: 106 mmol/L (ref 96–112)
CO2: 22 mmol/L (ref 19–32)
Calcium: 9 mg/dL (ref 8.4–10.5)
Creatinine, Ser: 0.77 mg/dL (ref 0.50–1.10)
GFR calc Af Amer: >90 mL/min (ref >90)
GFR calc non Af Amer: >90 mL/min (ref >90)
GLUCOSE: 123 mg/dL — AB (ref 70–99)
POTASSIUM: 3.9 mmol/L (ref 3.5–5.1)
Sodium: 138 mmol/L (ref 135–145)

## 2014-12-15 LAB — CBC
HCT: 31.6 % — ABNORMAL LOW (ref 36.0–46.0)
HEMOGLOBIN: 10.3 g/dL — AB (ref 12.0–15.0)
MCH: 28.1 pg (ref 26.0–34.0)
MCHC: 32.6 g/dL (ref 30.0–36.0)
MCV: 86.3 fL (ref 78.0–100.0)
Platelets: 390 10*3/uL (ref 150–400)
RBC: 3.66 MIL/uL — ABNORMAL LOW (ref 3.87–5.11)
RDW: 17.2 % — ABNORMAL HIGH (ref 11.5–15.5)
WBC: 6.4 10*3/uL (ref 4.0–10.5)

## 2014-12-15 MED ORDER — WARFARIN SODIUM 5 MG PO TABS: 5.0000 mg | ORAL_TABLET | Freq: Every day | ORAL | Status: DC

## 2014-12-15 MED ORDER — INSULIN DETEMIR 100 UNIT/ML FLEXPEN: 15.0000 [IU] | PEN_INJECTOR | Freq: Two times a day (BID) | SUBCUTANEOUS | Status: DC

## 2014-12-15 MED ORDER — INSULIN DETEMIR 100 UNIT/ML ~~LOC~~ SOLN
15.0000 [IU] | Freq: Two times a day (BID) | SUBCUTANEOUS | Status: DC
  Administered 2014-12-15: 15 [IU] via SUBCUTANEOUS
  Filled 2014-12-15 (×2): qty 0.15

## 2014-12-15 MED ORDER — CLOPIDOGREL BISULFATE 75 MG PO TABS: 75.0000 mg | ORAL_TABLET | Freq: Every day | ORAL | Status: DC

## 2014-12-15 MED ORDER — GLUCERNA SHAKE PO LIQD: 237.0000 mL | Freq: Two times a day (BID) | ORAL | Status: DC

## 2014-12-15 MED ORDER — LISINOPRIL 2.5 MG PO TABS: 2.5000 mg | ORAL_TABLET | Freq: Every day | ORAL | Status: DC

## 2014-12-15 MED ORDER — WARFARIN SODIUM 7.5 MG PO TABS
7.5000 mg | ORAL_TABLET | Freq: Once | ORAL | Status: DC
  Filled 2014-12-15: qty 1

## 2014-12-15 MED ORDER — METOPROLOL TARTRATE 25 MG PO TABS: 12.5000 mg | ORAL_TABLET | Freq: Two times a day (BID) | ORAL | Status: DC

## 2014-12-15 MED ORDER — INSULIN LISPRO 100 UNIT/ML (KWIKPEN): PEN_INJECTOR | SUBCUTANEOUS | Status: DC

## 2014-12-15 MED ORDER — ENOXAPARIN SODIUM 80 MG/0.8ML ~~LOC~~ SOLN: 80.0000 mg | Freq: Two times a day (BID) | SUBCUTANEOUS | Status: DC

## 2014-12-15 NOTE — Progress Notes (Signed)
Obtained Lovenox prescription from Dr Gonzella Lex. Called [facility] at [facility] at [phone] and spoke to [place] to make aware lovenox is on the way. Fax number confirmed at [phone]. [facility] Care Management to cover lovenox copay to assist with discharge. Inpatient RNCM aware. Raiford Noble, MSN- RN,BSN- [facility] [phone]

## 2014-12-15 NOTE — Discharge Summary (Signed)
. Physician Discharge Summary  Lakita Duling CHY:[id number] DOB: 07/08/1961 DOA: 12/03/2014  PCP: Janell Quiet, FNP  Admit date: 12/03/2014 Discharge date: 12/15/2014  Time spent: 35  minutes  Recommendations for Outpatient Follow-up:  1. Discharge home with home health RN, PT/OT, social work. 2. Patient's Coumadin dose has been adjusted and will bridge with subcutaneous Lovenox for next 2-3 days until INR therapeutic. ( Home health RN will fax INR results to PCP office on 1/29 and patient will be instructed further based on INR results. PCP office has been notified) 3. Appointment scheduled for PCP follow-up on 12/21/2014 at 11:30 AM. Patient will follow-up with cardiology Dr. Eden Emms in 3 weeks. ( Instructed to call the office to schedule appointment). 4. Insulin dose adjusted. [facility] has a follow-up appointment with endocrinology in about 2 weeks.   Discharge Diagnoses:  Principal Problem:   Severe sepsis with acute organ dysfunction  Active Problems:   Diabetic ketoacidosis without coma associated with type 1 diabetes mellitus   NSTEMI (non-ST elevated myocardial infarction)   Type 1 diabetes mellitus with neurological manifestations   Spastic hemiplegia affecting nondominant side   Diabetes type 1, uncontrolled   S/P aortic valve replacement   CAD (coronary artery disease)   HTN (hypertension)   Rheumatoid arthritis   AKI (acute kidney injury)   Lactic acidosis   Supratherapeutic INR   Hypokalemia   Acute on chronic combined systolic and diastolic CHF (congestive heart failure)   Essential hypertension   Acute kidney injury   Discharge Condition: Fair  Diet recommendation: Diabetic/heart healthy  CODE STATUS: Full code   [facility] Weights   12/12/14 2142 12/13/14 2120 12/14/14 2030  Weight: 77.1 kg (169 lb 15.6 oz) 76.5 kg (168 lb 10.4 oz) 76.9 kg (169 lb 8.5 oz)    History of present illness:  Please refer to admission H&P for details, but in brief,  54 year old female with history of type 1 diabetes mellitus (previously on insulin pump which was discontinued in 2014 after she had a stroke), aortic valve replacement on Coumadin, hypertension, dyslipidemia, history of CVA, history of noncompliance to medications, coronary artery disease who presented to [facility] on 12/03/2014 with acute mental status changes. Workup in the ED showed patient to be in septic/hypovolemic shock with blood pressure of 80/66 mmHg, significant leukocytosis of 30,000 and found to be in DKA. No signs of infection on presentation. Patient was placed on IV insulin drip for DKA, aggressive IV hydration and admitted to ICU. She was started on empiric antibiotic coverage. [facility] had an elevated troponin peaking up to 5.63. Cardiology was consulted and underwent cardiac cath with PCI of LAD. Patient transitioned to stepdown once DKA resolved and clinically stable and was transitioned to medical floor subsequently.  Hospital Course:  septic/hypovolemic shock Likely in the setting of DKA and NST EMI Placed empirically on IV antibiotics and rehydrated with IV fluids. Has now resolved. No source of infection. Antibiotics have been discontinued.  Uncontrolled type 1 diabetes mellitus with diabetic ketoacidosis Patient transitioned to Levemir  twice a day and NovoLog 6 units 3 times a day with meals. A1c of 9. Noted for low normal blood glucose overnight. Patient reports not taking bedtime snack and was encouraged to do so. I have reduced the Levemir to 15 units twice a day and should be monitored as outpatient. -Patient has an appointment with her endocrinologist Dr on 12/30/2014.  NST EMI Patient underwent cardiac cath with DES to mid LAD on 1/21. Patient on aspirin.  Cardiology recommended to continue Plavix. ( there was report of non compliance on it) . Added low-dose metoprolol and lisinopril. Continue home dose pravastatin (LDL of 51) Patient instructed on diet compliance and  medication adherence. She should follow-up with cardiology as outpatient.  Acute on chronic combined systolic/diastolic CHF Patient currently euvolemic. 2-D echo done this admission showing EF of 45-50%. Added low-dose ACE inhibitor and beta blocker this admission.  Mechanical aortic valve replacement on Coumadin Patient presented with supratherapeutic INR on admission (6.23). Goal of INR is 2-3. Patient kept in hospital until INR was therapeutic. Have arranged for Lovenox prescriptions as outpatient and she will be discharged on Coumadin with Lovenox bridging for the next few days until INR is therapeutic. INR today of 1.94 should be therapeutic within the next 2-3 days. -Home health RN will check INR on 1/29 and fax results to her PCP office who have been notified. (If INR is therapeutic on 1/29 Lovenox should be discontinued). Patient will be discharged on Coumadin 5 mg daily except on Mondays and Wednesdays when she was take 7.5 mg daily.  Hypertension Blood pressure stable at this time. Added lisinopril and low-dose metoprolol  Rheumatoid arthritis Stable. Continue methotrexate  Hypokalemia On presentation. Potassium currently normal  Acute kidney injury Likely secondary to hypovolemia and shock upon presentation. Resolved with IV hydration  History of stroke Has residual mild left-sided weakness and speech dysfunction. Continue  aspirin, Plavix and statin.  Protein calorie malnutrition Added nutrition supplements.  Family communication: None at bedside  Disposition:  Home  with home health  Procedures: 1/16 echocardiogram;- Left ventricle: mild focalbasal and severe concentric hypertrophy of the septum - LVEF= 45% to 50%. Moderate hypokinesis of mid-apicalanteroseptal myocardium - Pulmonary arteries: PA peak pressure: 31 mm Hg (S). 1/20 cardiac catheterization Severe one-vessel coronary artery disease;90% stenosis mid LAD. 1/21; Percutaneous coronary intervention MID LAD 90  % TUBULAR LESION -- Synergy DES 2.5 mm x 32 mm (2.8 mm)  Antibiotics: Zosyn 1/15 > 1/21 Vancomycin 1/16 > 1/17   Consultations:  cardiology  PCCM  Discharge Exam: Filed Vitals:   12/15/14 0500  BP: 95/51  Pulse: 58  Temp: 97.7 F (36.5 C)  Resp: 18    General: Middle aged thin built female in no acute distress HEENT: No pallor, moist oral mucosa, supple neck Chest: Clear to auscultation bilaterally, no added sounds CVS: Normal S1 and S2, no murmurs rub or gallop Gastrointestinal: Soft, nondistended, nontender, bowel sounds present School skeletal: Warm, no edema CNS: Alert and oriented, chronic slurred speech, chronic mild left-sided weakness   Discharge Instructions   Discharge Instructions    Amb Referral to Cardiac Rehabilitation    Complete by:  As directed           Current Discharge Medication List    START taking these medications   Details  clopidogrel (PLAVIX) 75 MG tablet Take 1 tablet (75 mg total) by mouth daily with breakfast. Qty: 30 tablet, Refills: 2    enoxaparin (LOVENOX) 80 MG/0.8ML injection Inject 0.8 mLs (80 mg total) into the skin every 12 (twelve) hours. Qty: 8 mL, Refills: 0    feeding supplement, GLUCERNA SHAKE, (GLUCERNA SHAKE) LIQD Take 237 mLs by mouth 2 (two) times daily between meals. Qty: 60 Can, Refills: 0    lisinopril (PRINIVIL,ZESTRIL) 2.5 MG tablet Take 1 tablet (2.5 mg total) by mouth daily. Qty: 30 tablet, Refills: 0    metoprolol tartrate (LOPRESSOR) 25 MG tablet Take 0.5 tablets (12.5 mg total) by mouth 2 (  two) times daily. Qty: 60 tablet, Refills: 0      CONTINUE these medications which have CHANGED   Details  Insulin Detemir (LEVEMIR FLEXTOUCH) 100 UNIT/ML Pen Inject 15 Units into the skin 2 (two) times daily. Qty: 45 mL, Refills: 2    insulin lispro (HUMALOG KWIKPEN) 100 UNIT/ML KiwkPen Inject 6 units with evening meal Qty: 15 mL, Refills: 2    warfarin (COUMADIN) 5 MG tablet Take 1-1.5 tablets (5-7.5 mg  total) by mouth daily at 6 PM. Take 5 mg Daily By Mouth on , Tues, Thurs, Fri, Sat & Sun, Take 7.5 mg By Mouth on mondays and Wednesdays . Qty: 60 tablet, Refills: 0      CONTINUE these medications which have NOT CHANGED   Details  aspirin 81 MG tablet Take 1 tablet (81 mg total) by mouth daily. Qty: 30 tablet    baclofen (LIORESAL) 10 MG tablet Take 1 tablet (10 mg total) by mouth 2 (two) times daily. Qty: 60 each, Refills: 1    escitalopram (LEXAPRO) 20 MG tablet Take 20 mg by mouth daily.    methotrexate (RHEUMATREX) 2.5 MG tablet Take 1 tablet (2.5 mg total) by mouth 2 (two) times a week. Caution:Chemotherapy. Protect from light. Qty: 4 tablet, Refills: 0    omeprazole (PRILOSEC) 40 MG capsule TAKE 1 CAPSULE (40 MG TOTAL) BY MOUTH DAILY. Qty: 30 capsule, Refills: 6    ONE TOUCH ULTRA TEST test strip TEST 5 TIMES A DAY, AND LANCETS 2/DAY 250.03 Qty: 150 each, Refills: 1    pravastatin (PRAVACHOL) 20 MG tablet TAKE 1 TABLET EVERY DAY Qty: 90 tablet, Refills: 0    promethazine (PHENERGAN) 25 MG tablet Take 1 tablet (25 mg total) by mouth every 4 (four) hours as needed for nausea or vomiting. Qty: 60 tablet, Refills: 0    traMADol (ULTRAM) 50 MG tablet TAKE 1 OR 2 TABLETS BY MOUTH EVERY 8 HOURS AS NEEDED Qty: 90 tablet, Refills: 1    VESICARE 5 MG tablet TAKE 1/2 TABLET ONCE DAILY Qty: 45 tablet, Refills: 1    zolpidem (AMBIEN) 10 MG tablet Take 1 tablet by mouth at bedtime. Refills: 0              Allergies  Allergen Reactions  . Celebrex [Celecoxib] Rash  . Detrol [Tolterodine] Hives   Follow-up Information    Follow up with [facility].   Why:  Registered Nurse, Physical Therapy,  Occupational Therapy, social work services to start within 24-48 hours of  hospital discharge   Contact information:   [address] [place] [address] [phone]       Please follow up.   Why:  HHRN for disease management  and INR to be drawn on  Friday, Jan. 29th, 2016.       Follow up with CAMPBELL, Sherran Needs, FNP On 12/21/2014.   Specialty:  Family Medicine   Why:  11:30 AM   Contact information:   [address] [place] [address] [phone]       Follow up with Charlton Haws, MD. Schedule an appointment as soon as possible for a visit in 3 weeks.   Specialty:  Cardiology   Contact information:   [address] [place] [address] [phone]        The results of significant diagnostics from this hospitalization (including imaging, microbiology, ancillary and laboratory) are listed below for reference.    Significant Diagnostic Studies: Dg Chest 1 View  12/04/2014   CLINICAL DATA:  Diabetic ketoacidosis.  EXAM: CHEST - 1 VIEW  COMPARISON:  12/03/2014  FINDINGS: Heart size is within normal limits. No evidence of pulmonary infiltrate or pleural effusion. Right jugular central venous catheter tip remains in the proximal to mid right atrium. Prior median sternotomy noted.  IMPRESSION: No acute findings.   Electronically Signed   By: Myles Rosenthal M.D.   On: 12/04/2014 07:58   Dg Chest 1 View  12/03/2014   CLINICAL DATA:  Altered mental status.  EXAM: CHEST - 1 VIEW  COMPARISON:  10/02/2014.  FINDINGS: Interval mildly enlarged cardiac silhouette. Clear lungs with normal vascularity. Stable median sternotomy wires and prosthetic aortic valve. Mild scoliosis.  IMPRESSION: Interval mild cardiomegaly.   Electronically Signed   By: Gordan Payment M.D.   On: 12/03/2014 18:52   Ct Head Wo Contrast  12/03/2014   CLINICAL DATA:  Two day history of vomiting and slurred speech. Acute hyperglycemia, with blood sugars greater than 600. Prior history of stroke.  EXAM: CT HEAD WITHOUT CONTRAST  TECHNIQUE: Contiguous axial images were obtained from the base of the skull through the vertex without intravenous contrast.  COMPARISON:  09/30/2014, 03/08/2014.  FINDINGS: Encephalomalacia involving the right frontal,  temporal and parietal lobes, unchanged. Encephalomalacia involving the occipital lobes bilaterally, left greater than right, unchanged. Focal encephalomalacia involving the left posterior frontal lobe, unchanged Ventricular system normal in size and appearance for age. Severe changes of small vessel disease of the white matter, unchanged. No mass lesion. No midline shift. No acute hemorrhage or hematoma. No extra-axial fluid collections. No evidence of acute infarction.  No skull fracture or other focal osseous abnormality involving the skull. Visualized paranasal sinuses, bilateral mastoid air cells and bilateral middle ear cavities well-aerated. Extensive bilateral carotid siphon atherosclerosis.  IMPRESSION: 1. No acute intracranial abnormality. 2. Multiple cortical strokes including a large right MCA distribution stroke, bilateral PCA distribution strokes, and a focal left MCA distribution stroke.   Electronically Signed   By: Hulan Saas M.D.   On: 12/03/2014 16:59   Dg Chest Port 1 View  12/05/2014   CLINICAL DATA:  Subsequent encounter for followup of pulmonary infiltrate. Coronary artery disease.  EXAM: PORTABLE CHEST - 1 VIEW  COMPARISON:  One day prior  FINDINGS: Support apparatus: Right internal jugular line with tip at low SVC. Prior median sternotomy.  Cardiomediastinal silhouette: Mildly enlarged.  Pleura:  No pleural effusion or pneumothorax.  Lungs: No congestive failure.  Clear lungs.  Other: None  IMPRESSION: Cardiomegaly, without congestive failure or acute disease.   Electronically Signed   By: Jeronimo Greaves M.D.   On: 12/05/2014 07:10   Dg Chest Port 1 View  12/04/2014   CLINICAL DATA:  Central line placement.  Initial encounter.  EXAM: PORTABLE CHEST - 1 VIEW  COMPARISON:  Chest radiograph performed earlier today at 6:40 p.m.  FINDINGS: The patient's right IJ line is seen ending about the cavoatrial junction.  The lungs are well-aerated. Mild vascular congestion is noted, with mildly  increased interstitial markings, raising concern for mild interstitial edema. There is no evidence of pleural effusion or pneumothorax.  The cardiomediastinal silhouette is borderline normal in size. The patient is status post median sternotomy. No acute osseous abnormalities are seen.  IMPRESSION: 1. Right IJ line noted ending about the cavoatrial junction. 2. Mild vascular congestion, with mildly increased interstitial markings, raising concern for mild interstitial edema.   Electronically Signed   By: Beryle Beams.D.  On: 12/04/2014 02:37    Microbiology: No results found for this or any previous visit (from the past 240 hour(s)).   Labs: Basic Metabolic Panel:  Recent Labs Lab 12/09/14 0342 12/10/14 0400 12/11/14 0304 12/14/14 0627 12/15/14 0530  NA 138 140 142 137 138  K 4.1 3.7 3.9 4.0 3.9  CL 108 108 109 104 106  CO2 24 27 26 22 22   GLUCOSE 294* 259* 109* 276* 123*  BUN <5* <5* <5* 11 13  CREATININE 0.79 0.78 0.75 0.76 0.77  CALCIUM 8.3* 8.5 9.0 8.7 9.0  MG  --  1.8  --  1.8  --    Liver Function Tests:  Recent Labs Lab 12/11/14 0304 12/14/14 0627  AST 28 37  ALT 31 36*  ALKPHOS 74 74  BILITOT 0.4 0.4  PROT 5.6* 6.0  ALBUMIN 2.7* 2.9*   No results for input(s): LIPASE, AMYLASE in the last 168 hours. No results for input(s): AMMONIA in the last 168 hours. CBC:  Recent Labs Lab 12/11/14 0304 12/12/14 0515 12/13/14 0520 12/14/14 0627 12/15/14 0530  WBC 7.6 5.4 5.1 5.4 6.4  HGB 9.4* 9.6* 10.0* 9.8* 10.3*  HCT 29.4* 30.5* 31.9* 31.0* 31.6*  MCV 86.5 86.6 86.9 86.4 86.3  PLT 261 288 326 329 390   Cardiac Enzymes:  Recent Labs Lab 12/11/14 0304  TROPONINI 0.37*   BNP: BNP (last 3 results)  Recent Labs  09/30/14 1431  PROBNP 794.3*   CBG:  Recent Labs Lab 12/14/14 2014 12/15/14 0004 12/15/14 0035 12/15/14 0146 12/15/14 0815  GLUCAP 179* 59* 61* 101* 97       Signed:  Kratos Ruscitti  [facility] 12/15/2014, 11:20  AM

## 2014-12-15 NOTE — Care Management Note (Signed)
CARE MANAGEMENT NOTE 12/15/2014  Patient:  Madison [name], Madison [name]   Account Number:  [id number]  Date Initiated:  12/07/2014  Documentation initiated by:  Edwyna Shell  Subjective/Objective Assessment:   54 yo female admitted with altered mental status and DKA from home     Action/Plan:   discharge planning  12/13/14 Met with pt and discussed cost of Lovenox and generic. Pt states that she is unable to pay for this medication.   Anticipated DC Date:  12/15/2014   Anticipated DC Plan:  [place]  CM consult  Medication Assistance      [facility] Choice  HOME HEALTH   Choice offered to / List presented to:  C-1 Patient        [place] arranged  HH-1 RN  HH-10 DISEASE MANAGEMENT  HH-2 PT  HH-3 OT  [place]      [place].   Status of service:  Completed, signed off Medicare Important Message given?  YES (If response is "NO", the following Medicare IM given date fields will be blank) Date Medicare IM given:  12/07/2014 Medicare IM given by:  Edwyna Shell Date Additional Medicare IM given:  12/09/2014 Additional Medicare IM given by:  CRYSTAL HUTCHINSON  Discharge Disposition:  [place]  Per UR Regulation:  Reviewed for med. necessity/level of care/duration of stay  If discussed at [place] of Stay Meetings, dates discussed:   12/09/2014    Comments:   12/15/2014 [facility] to follow this pt and arrangements made for [facility] to help pt obtain Lovenox. [facility] will provide [facility] services and draw INR on Friday, Dec 17, 2014.  CRoyal RN MPH, case manager, [phone]  12/13/2014 Please see noted above, as pt is unable to purchase Lovenox or generic Lovenox due to minimal of $300 for the generic. Pt is not eligible for medication assistance as she has insurance. Please consider alternative treatments.  CRoyal RN MPH, case manager, [phone] IM given again and explained on 12/13/2014.   Crystal Hutchinson RN,  BSN, MSHL, CCM  Nurse - Case Manager,  (Unit [phone]  12/10/2014 Lovenox update: per ---12/10/2014 1450 by Adrian Saran--- [place] at [phone]. Talked to [place] (Ref # [id number]). Ed stated that LOVENOX is not covered under patient's prescription plan. Patient would have to pay the full price of $1324.96 for LOVENOX. The generic ENOXAPARIN SODIUM is covered by [facility]. Patient's $300.00 Prescription deductible would have to be met first. After the deductible is met, the generic's co-pay would be $1.20. These medications are TIER 4 Level.   Crystal Hutchinson RN, BSN, MSHL, CCM  Nurse - Case Manager,  (Unit [phone]  12/10/2014 Please assure pt can obtain and afford lovenox from her preferred pharmacy - will likely require 5 - 7 days of tx w/ dose to be determined per Pharmacy ---12/10/2014 1111 by Mariann Laster--- [facility] Benefits check:  request sent. Lovenox 13m 2 times / day x 5-7 days status post  prosthetic AoV replacement Coverage, co-pay, authorization, deductibles, pharmacy. Thanks, CMariann LasterRN, BSN, MSHL, CCM  Nurse - Case Manager,  (Unit [phone] 12/10/2014     CMariann LasterRN, BSN, MSHL, CCM  Nurse - Case Manager,  (Unit [address] [phone] 12/09/2014 Home with HHS:  RN, PT, OT, SW Hx/o 2 admissions and 2 ER visit over the past 6 months. 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  Please provide community resources and encourage compliance with completing paperwork, meeting deadlines in order to provide services needed. PCP:  Dr. Wynetta Emery ([facility] notified)   12/07/14 Edwyna Shell RN BSN CM [phone] Patient lives at home with her 72 year old son. Her sister and a friend drives them to appointments. Patient stated she has a walker, cane, wheelchair and BSC on house. Discussed ordered [facility] services and  patient stated that she has used [facility] in the past and would like to have them provide the [facility] services again. She also requested help with Medicaid. She stated that she had Medicaid with [facility] and then moved into a new apartment and was late in receiving and returning the required documentation. Encouraged the patient to contact DSS to follow up on status of application, and patient stated she has the number.

## 2014-12-15 NOTE — Progress Notes (Signed)
CARDIAC REHAB PHASE I   PRE:  Rate/Rhythm: no tele  HR 77 per pulse ox  BP:  Supine:   Sitting: 114/51  Standing:    SaO2: 99%RA  MODE:  Ambulation: 420 ft   POST:  Rate/Rhythm: 91  BP:  Supine:   Sitting: 120/50  Standing:    SaO2: 100%RA 1030-1050 Pt walked 420 ft with handheld asst with steady gait. Tolerated well. Tired because she did not sleep well last night. Anxious to go home.   Luetta Nutting, RN BSN  12/15/2014 10:48 AM

## 2014-12-15 NOTE — Discharge Instructions (Signed)
Diabetic Ketoacidosis °Diabetic ketoacidosis (DKA) is a life-threatening complication of type 1 diabetes. It must be quickly recognized and treated. Treatment requires hospitalization. °CAUSES  °When there is no insulin in the body, glucose (sugar) cannot be used, and the body breaks down fat for energy. When fat breaks down, acids (ketones) build up in the blood. Very high levels of glucose and high levels of acids lead to severe loss of body fluids (dehydration) and other dangerous chemical changes. This stresses your vital organs and can cause coma or death. °SIGNS AND SYMPTOMS  °· Tiredness (fatigue). °· Weight loss. °· Excessive thirst. °· Ketones in your urine. °· Light-headedness. °· Fruity or sweet smelling breath. °· Excessive urination. °· Visual changes. °· Confusion or irritability. °· Nausea or vomiting. °· Rapid breathing. °· Stomachache or abdominal pain. °DIAGNOSIS  °Your health care provider will diagnose DKA based on your history, physical exam, and blood tests. The health care provider will check to see if you have another illness that caused you to go into DKA. Most of this will be done quickly in an emergency room. °TREATMENT  °· Fluid replacement to correct dehydration. °· Insulin. °· Correction of electrolytes, such as potassium and sodium. °· Antibiotic medicines. °PREVENTION °· Always take your insulin. Do not skip your insulin injections. °· If you are sick, treat yourself quickly. Your body often needs more insulin to fight the illness. °· Check your blood glucose regularly. °· Check urine ketones if your blood glucose is greater than 240 milligrams per deciliter (mg/dL). °· Do not use outdated (expired) insulin. °· If your blood glucose is high, drink plenty of fluids. This helps flush out ketones. °HOME CARE INSTRUCTIONS  °· If you are sick, follow the advice of your health care provider. °· To prevent dehydration, drink enough water and fluids to keep your urine clear or pale  yellow. °¨ If you cannot eat, alternate between drinking fluids with sugar (soda, juices, flavored gelatin) and salty fluids (broth, bouillon). °¨ If you can eat, follow your usual diet and drink sugar-free liquids (water, diet drinks). °· Always take your usual dose of insulin. If you cannot eat or if your glucose is getting too low, call your health care provider for further instructions. °· Continue to monitor your blood or urine ketones every 3-4 hours around the clock. Set your alarm clock or have someone wake you up. If you are too sick, have someone test it for you. °· Rest and avoid exercise. °SEEK MEDICAL CARE IF:  °· You have a fever. °· You have ketones in your urine, or your blood glucose is higher than a level your health care provider suggests. You may need extra insulin. Call your health care provider if you need advice on adjusting your insulin. °· You cannot drink at least a tablespoon (15 mL) of fluid every 15-20 minutes. °· You have been vomiting for more than 2 hours. °· You have symptoms of DKA: °¨ Fruity smelling breath. °¨ Breathing faster or slower. °¨ Becoming very sleepy. °SEEK IMMEDIATE MEDICAL CARE IF:  °· You have signs of dehydration: °¨ Decreased urination. °¨ Increased thirst. °¨ Dry skin and mouth. °¨ Light-headedness. °· Your blood glucose is very high (as advised by your health care provider) twice in a row. °· You faint. °· You have chest pain or trouble breathing. °· You have a sudden, severe headache. °· You have sudden weakness in one arm or one leg. °· You have sudden trouble speaking or swallowing. °· You   have vomiting or diarrhea that is getting worse after 3 hours. °· You have abdominal pain. °MAKE SURE YOU:  °· Understand these instructions. °· Will watch your condition. °· Will get help right away if you are not doing well or get worse. °Document Released: 11/02/2000 Document Revised: 11/10/2013 Document Reviewed: 05/11/2009 °ExitCare® Patient Information ©2015 [employer]  LL[employer]. This information is not intended to replace advice given to you by your health care provider. Make sure you discuss any questions you have with your health care provider. ° °

## 2014-12-15 NOTE — Progress Notes (Addendum)
ANTICOAGULATION CONSULT NOTE - Follow Up Consult  Pharmacy Consult for Coumadin Indication: Mechanical AVR + CVA's  Allergies  Allergen Reactions  . Celebrex [Celecoxib] Rash  . Detrol [Tolterodine] Hives    Patient Measurements: Height: 5\' 3"  (160 cm) Weight: 169 lb 8.5 oz (76.9 kg) IBW/kg (Calculated) : 52.4 Heparin Dosing Weight:   Vital Signs: Temp: 97.7 F (36.5 C) (01/27 0500) Temp Source: Oral (01/27 0500) BP: 95/51 mmHg (01/27 0500) Pulse Rate: 58 (01/27 0500)  Labs:  Recent Labs  12/13/14 0520 12/14/14 0627 12/15/14 0530  HGB 10.0* 9.8* 10.3*  HCT 31.9* 31.0* 31.6*  PLT 326 329 390  LABPROT 18.3* 19.7* 22.3*  INR 1.50* 1.65* 1.94*  CREATININE  --  0.76 0.77    Estimated Creatinine Clearance: 79.9 mL/min (by C-G formula based on Cr of 0.77).   Medications:  Scheduled:  . antiseptic oral rinse  7 mL Mouth Rinse q12n4p  . aspirin  81 mg Oral Daily  . atorvastatin  80 mg Oral q1800  . baclofen  10 mg Oral BID  . chlorhexidine  15 mL Mouth Rinse BID  . clopidogrel  75 mg Oral Q breakfast  . darifenacin  7.5 mg Oral Daily  . enoxaparin (LOVENOX) injection  80 mg Subcutaneous Q12H  . escitalopram  20 mg Oral Daily  . feeding supplement (GLUCERNA SHAKE)  237 mL Oral BID BM  . insulin aspart  0-15 Units Subcutaneous TID WC  . insulin aspart  6 Units Subcutaneous TID WC  . insulin detemir  16 Units Subcutaneous BID  . lisinopril  2.5 mg Oral Daily  . metoprolol tartrate  12.5 mg Oral BID  . pantoprazole  40 mg Oral Daily  . Warfarin - Pharmacist Dosing Inpatient   Does not apply q1800    Assessment: Madison [name] with DM1 admitted with severe sepsis, acute organ dysfunction, DKA, NSTEMI. Has mechanical AVR with hx recurrent CVAs with INR subtherapeutic. New DES stent placed to the mid-LAD this admit - patient on triple therapy with bASA + warfarin + plavix. Also being bridged with lovenox while INR <2.5. Per cardiology, to continue with plavix despite high PRU  unless clinical failure seen on plavix (i.e. stent thrombosis)  INR 1.94 this AM, trending upward.  Hg is stable, pltc is wnl.  No bleeding problems noted.  All doses charted. Note patient did receive vitamin K 5mg  on 1/18 which is why patient has been requiring elevated doses as inpatient.   Goal of Therapy:  INR 2-3 Anti-Xa level 0.6-1 units/ml 4hrs after LMWH dose given Monitor platelets by anticoagulation protocol: Yes   Plan:  Coumadin 7.5mg  today x1  If patient to discharge home, recommend home on 5mg  daily except 7.5mg  on Monday and Wednesday. Needs INR check in clinic within 2-3 days to determine if can stop lovenox Continue Lovenox 80mg  SQ q12 F/U INR in AM, CBC q72 while on Lovenox  , PharmD, BCPS Clinical Pharmacist [phone] 12/15/2014, 7:55 AM

## 2014-12-17 ENCOUNTER — Telehealth: Payer: Self-pay | Admitting: Family

## 2014-12-17 DIAGNOSIS — F0390 Unspecified dementia without behavioral disturbance: Secondary | ICD-10-CM | POA: Diagnosis not present

## 2014-12-17 DIAGNOSIS — M069 Rheumatoid arthritis, unspecified: Secondary | ICD-10-CM | POA: Diagnosis not present

## 2014-12-17 DIAGNOSIS — Z7901 Long term (current) use of anticoagulants: Secondary | ICD-10-CM | POA: Diagnosis not present

## 2014-12-17 DIAGNOSIS — E1065 Type 1 diabetes mellitus with hyperglycemia: Secondary | ICD-10-CM | POA: Diagnosis not present

## 2014-12-17 DIAGNOSIS — M6281 Muscle weakness (generalized): Secondary | ICD-10-CM | POA: Diagnosis not present

## 2014-12-17 DIAGNOSIS — W19XXXD Unspecified fall, subsequent encounter: Secondary | ICD-10-CM | POA: Diagnosis not present

## 2014-12-17 DIAGNOSIS — B3781 Candidal esophagitis: Secondary | ICD-10-CM | POA: Diagnosis not present

## 2014-12-17 NOTE — Telephone Encounter (Signed)
Pt takes 5mg  all days except 7.5mg  on Wednesdays and is currently taking 2 lovenox inj qd.  Per 12-13-1992, have pt take 10mg  today and tomorrow then continue current dose. Recheck in 1 week.   Pt aware and verbalized understanding. Pt has appointment on Tuesday 12/21/14

## 2014-12-17 NOTE — Telephone Encounter (Signed)
Please verify current coumadin dosage

## 2014-12-17 NOTE — Telephone Encounter (Signed)
Madison [name]  [facility] calling to report inr 1.3 and protime 15.3. Please advise

## 2014-12-18 DIAGNOSIS — M6281 Muscle weakness (generalized): Secondary | ICD-10-CM | POA: Diagnosis not present

## 2014-12-18 DIAGNOSIS — E1065 Type 1 diabetes mellitus with hyperglycemia: Secondary | ICD-10-CM | POA: Diagnosis not present

## 2014-12-18 DIAGNOSIS — Z7901 Long term (current) use of anticoagulants: Secondary | ICD-10-CM | POA: Diagnosis not present

## 2014-12-18 DIAGNOSIS — B3781 Candidal esophagitis: Secondary | ICD-10-CM | POA: Diagnosis not present

## 2014-12-18 DIAGNOSIS — F0390 Unspecified dementia without behavioral disturbance: Secondary | ICD-10-CM | POA: Diagnosis not present

## 2014-12-18 DIAGNOSIS — W19XXXD Unspecified fall, subsequent encounter: Secondary | ICD-10-CM | POA: Diagnosis not present

## 2014-12-18 DIAGNOSIS — M069 Rheumatoid arthritis, unspecified: Secondary | ICD-10-CM | POA: Diagnosis not present

## 2014-12-20 DIAGNOSIS — M6281 Muscle weakness (generalized): Secondary | ICD-10-CM | POA: Diagnosis not present

## 2014-12-20 DIAGNOSIS — I219 Acute myocardial infarction, unspecified: Secondary | ICD-10-CM

## 2014-12-20 DIAGNOSIS — Z7901 Long term (current) use of anticoagulants: Secondary | ICD-10-CM | POA: Diagnosis not present

## 2014-12-20 DIAGNOSIS — M069 Rheumatoid arthritis, unspecified: Secondary | ICD-10-CM | POA: Diagnosis not present

## 2014-12-20 DIAGNOSIS — E1065 Type 1 diabetes mellitus with hyperglycemia: Secondary | ICD-10-CM | POA: Diagnosis not present

## 2014-12-20 DIAGNOSIS — F0390 Unspecified dementia without behavioral disturbance: Secondary | ICD-10-CM | POA: Diagnosis not present

## 2014-12-20 DIAGNOSIS — W19XXXD Unspecified fall, subsequent encounter: Secondary | ICD-10-CM | POA: Diagnosis not present

## 2014-12-20 DIAGNOSIS — B3781 Candidal esophagitis: Secondary | ICD-10-CM | POA: Diagnosis not present

## 2014-12-20 HISTORY — DX: Acute myocardial infarction, unspecified: I21.9

## 2014-12-21 ENCOUNTER — Ambulatory Visit (INDEPENDENT_AMBULATORY_CARE_PROVIDER_SITE_OTHER): Payer: Commercial Managed Care - HMO | Admitting: Family

## 2014-12-21 ENCOUNTER — Encounter: Payer: Self-pay | Admitting: Family

## 2014-12-21 VITALS — BP 100/58 | Temp 98.8°F | Wt 162.0 lb

## 2014-12-21 DIAGNOSIS — I252 Old myocardial infarction: Secondary | ICD-10-CM

## 2014-12-21 DIAGNOSIS — I10 Essential (primary) hypertension: Secondary | ICD-10-CM | POA: Diagnosis not present

## 2014-12-21 DIAGNOSIS — Z954 Presence of other heart-valve replacement: Secondary | ICD-10-CM | POA: Diagnosis not present

## 2014-12-21 DIAGNOSIS — W19XXXD Unspecified fall, subsequent encounter: Secondary | ICD-10-CM | POA: Diagnosis not present

## 2014-12-21 DIAGNOSIS — M6281 Muscle weakness (generalized): Secondary | ICD-10-CM | POA: Diagnosis not present

## 2014-12-21 DIAGNOSIS — Z79899 Other long term (current) drug therapy: Secondary | ICD-10-CM | POA: Diagnosis not present

## 2014-12-21 DIAGNOSIS — E7800 Pure hypercholesterolemia, unspecified: Secondary | ICD-10-CM

## 2014-12-21 DIAGNOSIS — F329 Major depressive disorder, single episode, unspecified: Secondary | ICD-10-CM

## 2014-12-21 DIAGNOSIS — B3781 Candidal esophagitis: Secondary | ICD-10-CM | POA: Diagnosis not present

## 2014-12-21 DIAGNOSIS — M069 Rheumatoid arthritis, unspecified: Secondary | ICD-10-CM

## 2014-12-21 DIAGNOSIS — F32A Depression, unspecified: Secondary | ICD-10-CM

## 2014-12-21 DIAGNOSIS — F0390 Unspecified dementia without behavioral disturbance: Secondary | ICD-10-CM | POA: Diagnosis not present

## 2014-12-21 DIAGNOSIS — Z952 Presence of prosthetic heart valve: Secondary | ICD-10-CM

## 2014-12-21 DIAGNOSIS — Z7901 Long term (current) use of anticoagulants: Secondary | ICD-10-CM | POA: Diagnosis not present

## 2014-12-21 DIAGNOSIS — E1065 Type 1 diabetes mellitus with hyperglycemia: Secondary | ICD-10-CM | POA: Diagnosis not present

## 2014-12-21 DIAGNOSIS — Z5181 Encounter for therapeutic drug level monitoring: Secondary | ICD-10-CM | POA: Diagnosis not present

## 2014-12-21 LAB — POCT INR: INR: 2.1

## 2014-12-21 NOTE — Progress Notes (Signed)
Pre visit review using our clinic review tool, if applicable. No additional management support is needed unless otherwise documented below in the visit note. 

## 2014-12-21 NOTE — Progress Notes (Signed)
Subjective:    Patient ID: Madison [name], female    DOB: 25-Jun-1961, 54 y.o.   MRN: [id number]  HPI 54 year old white female, nonsmoker status post non-ST elevated myocardial infarction on 12/03/2014 who underwent cardiac catheterization is in today for hospital follow-up. Reports doing well. Presented to the emergency department with nausea and vomiting. Denied chest pain at that time. Was found to have an elevated troponin. Has a history of aortic valve replacement on chronic anticoagulation therapy, type 1 diabetes, hypokalemia, congestive heart failure. She has an adopted son who is currently in the care of her sister while she tries to find a roommate to come live with her to help care for him. Child protective services is investigating that she feels things are improving. Denies any chest pain. Sees endocrinology for management of type 1 diabetes. Is due to follow up with cardiology 4 weeks status post discharge for NSTEMI.   Review of Systems  Constitutional: Negative.   HENT: Negative.   Respiratory: Negative.   Cardiovascular: Negative.  Negative for chest pain, palpitations and leg swelling.  Gastrointestinal: Negative.   Endocrine: Negative.   Genitourinary: Negative.   Musculoskeletal: Negative.   Skin: Negative.   Allergic/Immunologic: Negative.   Hematological: Negative.   Psychiatric/Behavioral: Negative.    Past Medical History  Diagnosis Date  . CAD (coronary artery disease)   . Obesity   . Hypercholesteremia   . HTN (hypertension)   . Dyslipidemia   . Stroke syndrome   . Aortic stenosis   . Thrombophlebitis   . Diabetes   . Hyperlipidemia   . Rheumatoid arthritis(714.0)     History   Social History  . Marital Status: Single    Spouse Name: N/A    Number of Children: N/A  . Years of Education: N/A   Occupational History  . Not on file.   Social History Main Topics  . Smoking status: Never Smoker   . Smokeless tobacco: Never Used  . Alcohol Use: No    . Drug Use: No  . Sexual Activity: Not on file   Other Topics Concern  . Not on file   Social History Narrative    Past Surgical History  Procedure Laterality Date  . Cesarean section    . Foot fracture surgery    . Knee surgery    . Neuroplasty / transposition median nerve at carpal tunnel    . Left heart catheterization with coronary angiogram N/A 12/08/2014    Procedure: LEFT HEART CATHETERIZATION WITH CORONARY ANGIOGRAM;  Surgeon: Iran Ouch, MD;  Location: [facility] CATH LAB;  Service: Cardiovascular;  Laterality: N/A;  . Percutaneous coronary stent intervention (pci-s) N/A 12/09/2014    Procedure: PERCUTANEOUS CORONARY STENT INTERVENTION (PCI-S);  Surgeon: [name], MD;  Location: [facility] CATH LAB;  Service: Cardiovascular;  Laterality: N/A;    Family History  Problem Relation Age of Onset  . Stroke    . Heart disease Mother   . Heart disease Sister     Allergies  Allergen Reactions  . Celebrex [Celecoxib] Rash  . Detrol [Tolterodine] Hives    Current Outpatient Prescriptions on File Prior to Visit  Medication Sig Dispense Refill  . aspirin 81 MG tablet Take 1 tablet (81 mg total) by mouth daily. 30 tablet   . baclofen (LIORESAL) 10 MG tablet Take 1 tablet (10 mg total) by mouth 2 (two) times daily. 60 each 1  . clopidogrel (PLAVIX) 75 MG tablet Take 1 tablet (75 mg total) by mouth daily  with breakfast. 30 tablet 2  . enoxaparin (LOVENOX) 80 MG/0.8ML injection Inject 0.8 mLs (80 mg total) into the skin every 12 (twelve) hours. 8 mL 0  . escitalopram (LEXAPRO) 20 MG tablet Take 20 mg by mouth daily.    . feeding supplement, GLUCERNA SHAKE, (GLUCERNA SHAKE) LIQD Take 237 mLs by mouth 2 (two) times daily between meals. 60 Can 0  . glucose blood (EMBRACE BLOOD GLUCOSE TEST) test strip Use to check blood sugar twice daily 200 each 3  . Insulin Detemir (LEVEMIR FLEXTOUCH) 100 UNIT/ML Pen Inject 15 Units into the skin 2 (two) times daily. 45 mL 2  . insulin lispro (HUMALOG  KWIKPEN) 100 UNIT/ML KiwkPen Inject 6 units with evening meal 15 mL 2  . lisinopril (PRINIVIL,ZESTRIL) 2.5 MG tablet Take 1 tablet (2.5 mg total) by mouth daily. 30 tablet 0  . methotrexate (RHEUMATREX) 2.5 MG tablet Take 1 tablet (2.5 mg total) by mouth 2 (two) times a week. Caution:Chemotherapy. Protect from light. 4 tablet 0  . metoprolol tartrate (LOPRESSOR) 25 MG tablet Take 0.5 tablets (12.5 mg total) by mouth 2 (two) times daily. 60 tablet 0  . omeprazole (PRILOSEC) 40 MG capsule TAKE 1 CAPSULE (40 MG TOTAL) BY MOUTH DAILY. 30 capsule 6  . ONE TOUCH ULTRA TEST test strip TEST 5 TIMES A DAY, AND LANCETS 2/DAY 250.03 (Patient taking differently: TEST 5 TIMES A DAY  250.03) 150 each 1  . pravastatin (PRAVACHOL) 20 MG tablet TAKE 1 TABLET EVERY DAY 90 tablet 0  . promethazine (PHENERGAN) 25 MG tablet Take 1 tablet (25 mg total) by mouth every 4 (four) hours as needed for nausea or vomiting. 60 tablet 0  . traMADol (ULTRAM) 50 MG tablet TAKE 1 OR 2 TABLETS BY MOUTH EVERY 8 HOURS AS NEEDED 90 tablet 1  . VESICARE 5 MG tablet TAKE 1/2 TABLET ONCE DAILY 45 tablet 1  . warfarin (COUMADIN) 5 MG tablet Take 1-1.5 tablets (5-7.5 mg total) by mouth daily at 6 PM. Take 5 mg Daily By Mouth on , Tues, Thurs, Fri, Sat & Sun, Take 7.5 mg By Mouth on mondays and Wednesdays . 60 tablet 0  . zolpidem (AMBIEN) 10 MG tablet Take 1 tablet by mouth at bedtime.  0   No current facility-administered medications on file prior to visit.    BP 100/58 mmHg  Temp(Src) 98.8 F (37.1 C) (Oral)  Wt 162 lb (73.483 kg)chart    Objective:   Physical Exam  Constitutional: She is oriented to person, place, and time. She appears well-developed and well-nourished.  HENT:  Right Ear: External ear normal.  Left Ear: External ear normal.  Nose: Nose normal.  Mouth/Throat: Oropharynx is clear and moist.  Neck: Normal range of motion. Neck supple. No thyromegaly present.  Cardiovascular: Normal rate, regular rhythm and normal  heart sounds.   Pulmonary/Chest: Effort normal and breath sounds normal.  Abdominal: Soft. Bowel sounds are normal.  Musculoskeletal: Normal range of motion.  Neurological: She is alert and oriented to person, place, and time.  Skin: Skin is warm and dry.  Psychiatric: She has a normal mood and affect.          Assessment & Plan:  Rosaelena was seen today for follow-up.  Diagnoses and associated orders for this visit:  RA (rheumatoid arthritis)  S/P aortic valve replacement  Essential hypertension  Pure hypercholesterolemia  Depression  Status post non-ST elevation myocardial infarction (NSTEMI)  Other Orders - POCT INR    Call the office  with any questions or concerns. Follow with cardiology and endocrinology. Recheck in 3 months and sooner as needed. Recheck for PT/INR in 3 weeks. Directions given today.

## 2014-12-21 NOTE — Patient Instructions (Signed)
Anticoagulation Dose Instructions as of 12/21/2014      Sun Mon Tue Wed Thu Fri Sat   New Dose 5 mg 5 mg 5 mg 7.5 mg 5 mg 5 mg 5 mg    Description        Take 10 mg today only. Then continue current dosage of 7.5mg on Wednesdays only. All other days 5 mg. Recheck in 3 weeks.       Fat and Cholesterol Control Diet Fat and cholesterol levels in your blood and organs are influenced by your diet. High levels of fat and cholesterol may lead to diseases of the heart, small and large blood vessels, gallbladder, liver, and pancreas. CONTROLLING FAT AND CHOLESTEROL WITH DIET Although exercise and lifestyle factors are important, your diet is key. That is because certain foods are known to raise cholesterol and others to lower it. The goal is to balance foods for their effect on cholesterol and more importantly, to replace saturated and trans fat with other types of fat, such as monounsaturated fat, polyunsaturated fat, and omega-3 fatty acids. On average, a person should consume no more than 15 to 17 g of saturated fat daily. Saturated and trans fats are considered "bad" fats, and they will raise LDL cholesterol. Saturated fats are primarily found in animal products such as meats, butter, and cream. However, that does not mean you need to give up all your favorite foods. Today, there are good tasting, low-fat, low-cholesterol substitutes for most of the things you like to eat. Choose low-fat or nonfat alternatives. Choose round or loin cuts of red meat. These types of cuts are lowest in fat and cholesterol. Chicken (without the skin), fish, veal, and ground turkey breast are great choices. Eliminate fatty meats, such as hot dogs and salami. Even shellfish have little or no saturated fat. Have a 3 oz (85 g) portion when you eat lean meat, poultry, or fish. Trans fats are also called "partially hydrogenated oils." They are oils that have been scientifically manipulated so that they are solid at room temperature  resulting in a longer shelf life and improved taste and texture of foods in which they are added. Trans fats are found in stick margarine, some tub margarines, cookies, crackers, and baked goods.  When baking and cooking, oils are a great substitute for butter. The monounsaturated oils are especially beneficial since it is believed they lower LDL and raise HDL. The oils you should avoid entirely are saturated tropical oils, such as coconut and palm.  Remember to eat a lot from food groups that are naturally free of saturated and trans fat, including fish, fruit, vegetables, beans, grains (barley, rice, couscous, bulgur wheat), and pasta (without cream sauces).  IDENTIFYING FOODS THAT LOWER FAT AND CHOLESTEROL  Soluble fiber may lower your cholesterol. This type of fiber is found in fruits such as apples, vegetables such as broccoli, potatoes, and carrots, legumes such as beans, peas, and lentils, and grains such as barley. Foods fortified with plant sterols (phytosterol) may also lower cholesterol. You should eat at least 2 g per day of these foods for a cholesterol lowering effect.  Read package labels to identify low-saturated fats, trans fat free, and low-fat foods at the supermarket. Select cheeses that have only 2 to 3 g saturated fat per ounce. Use a heart-healthy tub margarine that is free of trans fats or partially hydrogenated oil. When buying baked goods (cookies, crackers), avoid partially hydrogenated oils. Breads and muffins should be made from whole grains (whole-wheat   or whole oat flour, instead of "flour" or "enriched flour"). Buy non-creamy canned soups with reduced salt and no added fats.  FOOD PREPARATION TECHNIQUES  Never deep-fry. If you must fry, either stir-fry, which uses very little fat, or use non-stick cooking sprays. When possible, broil, bake, or roast meats, and steam vegetables. Instead of putting butter or margarine on vegetables, use lemon and herbs, applesauce, and cinnamon  (for squash and sweet potatoes). Use nonfat yogurt, salsa, and low-fat dressings for salads.  LOW-SATURATED FAT / LOW-FAT FOOD SUBSTITUTES Meats / Saturated Fat (g)  Avoid: Steak, marbled (3 oz/85 g) / 11 g  Choose: Steak, lean (3 oz/85 g) / 4 g  Avoid: Hamburger (3 oz/85 g) / 7 g  Choose: Hamburger, lean (3 oz/85 g) / 5 g  Avoid: Ham (3 oz/85 g) / 6 g  Choose: Ham, lean cut (3 oz/85 g) / 2.4 g  Avoid: Chicken, with skin, dark meat (3 oz/85 g) / 4 g  Choose: Chicken, skin removed, dark meat (3 oz/85 g) / 2 g  Avoid: Chicken, with skin, light meat (3 oz/85 g) / 2.5 g  Choose: Chicken, skin removed, light meat (3 oz/85 g) / 1 g Dairy / Saturated Fat (g)  Avoid: Whole milk (1 cup) / 5 g  Choose: Low-fat milk, 2% (1 cup) / 3 g  Choose: Low-fat milk, 1% (1 cup) / 1.5 g  Choose: Skim milk (1 cup) / 0.3 g  Avoid: Hard cheese (1 oz/28 g) / 6 g  Choose: Skim milk cheese (1 oz/28 g) / 2 to 3 g  Avoid: Cottage cheese, 4% fat (1 cup) / 6.5 g  Choose: Low-fat cottage cheese, 1% fat (1 cup) / 1.5 g  Avoid: Ice cream (1 cup) / 9 g  Choose: Sherbet (1 cup) / 2.5 g  Choose: Nonfat frozen yogurt (1 cup) / 0.3 g  Choose: Frozen fruit bar / trace  Avoid: Whipped cream (1 tbs) / 3.5 g  Choose: Nondairy whipped topping (1 tbs) / 1 g Condiments / Saturated Fat (g)  Avoid: Mayonnaise (1 tbs) / 2 g  Choose: Low-fat mayonnaise (1 tbs) / 1 g  Avoid: Butter (1 tbs) / 7 g  Choose: Extra light margarine (1 tbs) / 1 g  Avoid: Coconut oil (1 tbs) / 11.8 g  Choose: Olive oil (1 tbs) / 1.8 g  Choose: Corn oil (1 tbs) / 1.7 g  Choose: Safflower oil (1 tbs) / 1.2 g  Choose: Sunflower oil (1 tbs) / 1.4 g  Choose: Soybean oil (1 tbs) / 2.4 g  Choose: Canola oil (1 tbs) / 1 g Document Released: 11/05/2005 Document Revised: 03/02/2013 Document Reviewed: 02/03/2014 Exit[employer] Patient Information 2015 [employer]. This information is not intended to replace advice given to you by  your health care provider. Make sure you discuss any questions you have with your health care provider.   

## 2014-12-21 NOTE — Patient Instructions (Signed)
Anticoagulation Dose Instructions as of 12/21/2014      Glynis Smiles Tue Wed Thu Fri Sat   New Dose 5 mg 5 mg 5 mg 7.5 mg 5 mg 5 mg 5 mg    Description        Take 10 mg today only. Then continue current dosage of 7.5mg  on Wednesdays only. All other days 5 mg. Recheck in 3 weeks.       Fat and Cholesterol Control Diet Fat and cholesterol levels in your blood and organs are influenced by your diet. High levels of fat and cholesterol may lead to diseases of the heart, small and large blood vessels, gallbladder, liver, and pancreas. CONTROLLING FAT AND CHOLESTEROL WITH DIET Although exercise and lifestyle factors are important, your diet is key. That is because certain foods are known to raise cholesterol and others to lower it. The goal is to balance foods for their effect on cholesterol and more importantly, to replace saturated and trans fat with other types of fat, such as monounsaturated fat, polyunsaturated fat, and omega-3 fatty acids. On average, a person should consume no more than 15 to 17 g of saturated fat daily. Saturated and trans fats are considered "bad" fats, and they will raise LDL cholesterol. Saturated fats are primarily found in animal products such as meats, butter, and cream. However, that does not mean you need to give up all your favorite foods. Today, there are good tasting, low-fat, low-cholesterol substitutes for most of the things you like to eat. Choose low-fat or nonfat alternatives. Choose round or loin cuts of red meat. These types of cuts are lowest in fat and cholesterol. Chicken (without the skin), fish, veal, and ground [place] breast are great choices. Eliminate fatty meats, such as hot dogs and salami. Even shellfish have little or no saturated fat. Have a 3 oz (85 g) portion when you eat lean meat, poultry, or fish. Trans fats are also called "partially hydrogenated oils." They are oils that have been scientifically manipulated so that they are solid at room temperature  resulting in a longer shelf life and improved taste and texture of foods in which they are added. Trans fats are found in stick margarine, some tub margarines, cookies, crackers, and baked goods.  When baking and cooking, oils are a great substitute for butter. The monounsaturated oils are especially beneficial since it is believed they lower LDL and raise HDL. The oils you should avoid entirely are saturated tropical oils, such as coconut and palm.  Remember to eat a lot from food groups that are naturally free of saturated and trans fat, including fish, fruit, vegetables, beans, grains (barley, rice, couscous, bulgur wheat), and pasta (without cream sauces).  IDENTIFYING FOODS THAT LOWER FAT AND CHOLESTEROL  Soluble fiber may lower your cholesterol. This type of fiber is found in fruits such as apples, vegetables such as broccoli, potatoes, and carrots, legumes such as beans, peas, and lentils, and grains such as barley. Foods fortified with plant sterols (phytosterol) may also lower cholesterol. You should eat at least 2 g per day of these foods for a cholesterol lowering effect.  Read package labels to identify low-saturated fats, trans fat free, and low-fat foods at the supermarket. Select cheeses that have only 2 to 3 g saturated fat per ounce. Use a heart-healthy tub margarine that is free of trans fats or partially hydrogenated oil. When buying baked goods (cookies, crackers), avoid partially hydrogenated oils. Breads and muffins should be made from whole grains (whole-wheat  or whole oat flour, instead of "flour" or "enriched flour"). Buy non-creamy canned soups with reduced salt and no added fats.  FOOD PREPARATION TECHNIQUES  Never deep-fry. If you must fry, either stir-fry, which uses very little fat, or use non-stick cooking sprays. When possible, broil, bake, or roast meats, and steam vegetables. Instead of putting butter or margarine on vegetables, use lemon and herbs, applesauce, and cinnamon  (for squash and sweet potatoes). Use nonfat yogurt, salsa, and low-fat dressings for salads.  LOW-SATURATED FAT / LOW-FAT FOOD SUBSTITUTES Meats / Saturated Fat (g)  Avoid: Steak, marbled (3 oz/85 g) / 11 g  Choose: Steak, lean (3 oz/85 g) / 4 g  Avoid: Hamburger (3 oz/85 g) / 7 g  Choose: Hamburger, lean (3 oz/85 g) / 5 g  Avoid: Ham (3 oz/85 g) / 6 g  Choose: Ham, lean cut (3 oz/85 g) / 2.4 g  Avoid: Chicken, with skin, dark meat (3 oz/85 g) / 4 g  Choose: Chicken, skin removed, dark meat (3 oz/85 g) / 2 g  Avoid: Chicken, with skin, light meat (3 oz/85 g) / 2.5 g  Choose: Chicken, skin removed, light meat (3 oz/85 g) / 1 g Dairy / Saturated Fat (g)  Avoid: Whole milk (1 cup) / 5 g  Choose: Low-fat milk, 2% (1 cup) / 3 g  Choose: Low-fat milk, 1% (1 cup) / 1.5 g  Choose: Skim milk (1 cup) / 0.3 g  Avoid: Hard cheese (1 oz/28 g) / 6 g  Choose: Skim milk cheese (1 oz/28 g) / 2 to 3 g  Avoid: Cottage cheese, 4% fat (1 cup) / 6.5 g  Choose: Low-fat cottage cheese, 1% fat (1 cup) / 1.5 g  Avoid: Ice cream (1 cup) / 9 g  Choose: Sherbet (1 cup) / 2.5 g  Choose: Nonfat frozen yogurt (1 cup) / 0.3 g  Choose: Frozen fruit bar / trace  Avoid: Whipped cream (1 tbs) / 3.5 g  Choose: Nondairy whipped topping (1 tbs) / 1 g Condiments / Saturated Fat (g)  Avoid: Mayonnaise (1 tbs) / 2 g  Choose: Low-fat mayonnaise (1 tbs) / 1 g  Avoid: Butter (1 tbs) / 7 g  Choose: Extra light margarine (1 tbs) / 1 g  Avoid: Coconut oil (1 tbs) / 11.8 g  Choose: Olive oil (1 tbs) / 1.8 g  Choose: Corn oil (1 tbs) / 1.7 g  Choose: Safflower oil (1 tbs) / 1.2 g  Choose: Sunflower oil (1 tbs) / 1.4 g  Choose: Soybean oil (1 tbs) / 2.4 g  Choose: Canola oil (1 tbs) / 1 g Document Released: 11/05/2005 Document Revised: 03/02/2013 Document Reviewed: 02/03/2014 ExitCare Patient Information 2015 [place], [place]. This information is not intended to replace advice given to you by  your health care provider. Make sure you discuss any questions you have with your health care provider.

## 2014-12-22 DIAGNOSIS — B3781 Candidal esophagitis: Secondary | ICD-10-CM | POA: Diagnosis not present

## 2014-12-22 DIAGNOSIS — M6281 Muscle weakness (generalized): Secondary | ICD-10-CM | POA: Diagnosis not present

## 2014-12-22 DIAGNOSIS — F0390 Unspecified dementia without behavioral disturbance: Secondary | ICD-10-CM | POA: Diagnosis not present

## 2014-12-22 DIAGNOSIS — M069 Rheumatoid arthritis, unspecified: Secondary | ICD-10-CM | POA: Diagnosis not present

## 2014-12-22 DIAGNOSIS — W19XXXD Unspecified fall, subsequent encounter: Secondary | ICD-10-CM | POA: Diagnosis not present

## 2014-12-22 DIAGNOSIS — Z7901 Long term (current) use of anticoagulants: Secondary | ICD-10-CM | POA: Diagnosis not present

## 2014-12-22 DIAGNOSIS — E1065 Type 1 diabetes mellitus with hyperglycemia: Secondary | ICD-10-CM | POA: Diagnosis not present

## 2014-12-23 DIAGNOSIS — B3781 Candidal esophagitis: Secondary | ICD-10-CM | POA: Diagnosis not present

## 2014-12-23 DIAGNOSIS — W19XXXD Unspecified fall, subsequent encounter: Secondary | ICD-10-CM | POA: Diagnosis not present

## 2014-12-23 DIAGNOSIS — Z7901 Long term (current) use of anticoagulants: Secondary | ICD-10-CM | POA: Diagnosis not present

## 2014-12-23 DIAGNOSIS — M6281 Muscle weakness (generalized): Secondary | ICD-10-CM | POA: Diagnosis not present

## 2014-12-23 DIAGNOSIS — F0390 Unspecified dementia without behavioral disturbance: Secondary | ICD-10-CM | POA: Diagnosis not present

## 2014-12-23 DIAGNOSIS — M069 Rheumatoid arthritis, unspecified: Secondary | ICD-10-CM | POA: Diagnosis not present

## 2014-12-23 DIAGNOSIS — E1065 Type 1 diabetes mellitus with hyperglycemia: Secondary | ICD-10-CM | POA: Diagnosis not present

## 2014-12-24 ENCOUNTER — Ambulatory Visit: Payer: Medicare HMO | Admitting: Family

## 2014-12-24 ENCOUNTER — Ambulatory Visit: Payer: Medicare HMO | Admitting: Physical Medicine & Rehabilitation

## 2014-12-24 DIAGNOSIS — Z7901 Long term (current) use of anticoagulants: Secondary | ICD-10-CM | POA: Diagnosis not present

## 2014-12-24 DIAGNOSIS — F0390 Unspecified dementia without behavioral disturbance: Secondary | ICD-10-CM | POA: Diagnosis not present

## 2014-12-24 DIAGNOSIS — B3781 Candidal esophagitis: Secondary | ICD-10-CM | POA: Diagnosis not present

## 2014-12-24 DIAGNOSIS — E1065 Type 1 diabetes mellitus with hyperglycemia: Secondary | ICD-10-CM | POA: Diagnosis not present

## 2014-12-24 DIAGNOSIS — M069 Rheumatoid arthritis, unspecified: Secondary | ICD-10-CM | POA: Diagnosis not present

## 2014-12-24 DIAGNOSIS — M6281 Muscle weakness (generalized): Secondary | ICD-10-CM | POA: Diagnosis not present

## 2014-12-24 DIAGNOSIS — W19XXXD Unspecified fall, subsequent encounter: Secondary | ICD-10-CM | POA: Diagnosis not present

## 2014-12-27 DIAGNOSIS — M069 Rheumatoid arthritis, unspecified: Secondary | ICD-10-CM | POA: Diagnosis not present

## 2014-12-27 DIAGNOSIS — E1065 Type 1 diabetes mellitus with hyperglycemia: Secondary | ICD-10-CM | POA: Diagnosis not present

## 2014-12-27 DIAGNOSIS — B3781 Candidal esophagitis: Secondary | ICD-10-CM | POA: Diagnosis not present

## 2014-12-27 DIAGNOSIS — Z7901 Long term (current) use of anticoagulants: Secondary | ICD-10-CM | POA: Diagnosis not present

## 2014-12-27 DIAGNOSIS — F0390 Unspecified dementia without behavioral disturbance: Secondary | ICD-10-CM | POA: Diagnosis not present

## 2014-12-27 DIAGNOSIS — W19XXXD Unspecified fall, subsequent encounter: Secondary | ICD-10-CM | POA: Diagnosis not present

## 2014-12-27 DIAGNOSIS — M6281 Muscle weakness (generalized): Secondary | ICD-10-CM | POA: Diagnosis not present

## 2014-12-28 ENCOUNTER — Telehealth: Payer: Self-pay | Admitting: Family

## 2014-12-28 DIAGNOSIS — Z7901 Long term (current) use of anticoagulants: Secondary | ICD-10-CM | POA: Diagnosis not present

## 2014-12-28 DIAGNOSIS — E1065 Type 1 diabetes mellitus with hyperglycemia: Secondary | ICD-10-CM | POA: Diagnosis not present

## 2014-12-28 DIAGNOSIS — F0390 Unspecified dementia without behavioral disturbance: Secondary | ICD-10-CM | POA: Diagnosis not present

## 2014-12-28 DIAGNOSIS — M6281 Muscle weakness (generalized): Secondary | ICD-10-CM | POA: Diagnosis not present

## 2014-12-28 DIAGNOSIS — M069 Rheumatoid arthritis, unspecified: Secondary | ICD-10-CM | POA: Diagnosis not present

## 2014-12-28 DIAGNOSIS — W19XXXD Unspecified fall, subsequent encounter: Secondary | ICD-10-CM | POA: Diagnosis not present

## 2014-12-28 DIAGNOSIS — B3781 Candidal esophagitis: Secondary | ICD-10-CM | POA: Diagnosis not present

## 2014-12-28 NOTE — Telephone Encounter (Signed)
Pt called to say that her insurance company [employer] is requesting a letter stating why the patient need home health care.     Pt also need a rx for  VESICARE 5 MG tablet she said it should state 1 daily and not 1/2 daily  Pharmacy [facility] [address]

## 2014-12-29 ENCOUNTER — Telehealth: Payer: Self-pay | Admitting: Family

## 2014-12-29 DIAGNOSIS — E1040 Type 1 diabetes mellitus with diabetic neuropathy, unspecified: Secondary | ICD-10-CM

## 2014-12-29 DIAGNOSIS — F332 Major depressive disorder, recurrent severe without psychotic features: Secondary | ICD-10-CM | POA: Diagnosis not present

## 2014-12-29 MED ORDER — SOLIFENACIN SUCCINATE 5 MG PO TABS: 2.5000 mg | ORAL_TABLET | Freq: Every day | ORAL | Status: DC

## 2014-12-29 NOTE — Telephone Encounter (Signed)
Referral placed.

## 2014-12-29 NOTE — Telephone Encounter (Signed)
Pt does not want to see dr Everardo All again. Pt would like a referral to see dr Sharl Ma. Pt has medicare and medicaid. Can we refer?

## 2014-12-29 NOTE — Telephone Encounter (Signed)
Letter faxed and Rx sent to pharmacy

## 2014-12-30 ENCOUNTER — Ambulatory Visit: Payer: Medicare HMO | Admitting: Endocrinology

## 2014-12-30 DIAGNOSIS — M069 Rheumatoid arthritis, unspecified: Secondary | ICD-10-CM | POA: Diagnosis not present

## 2014-12-30 DIAGNOSIS — F0390 Unspecified dementia without behavioral disturbance: Secondary | ICD-10-CM | POA: Diagnosis not present

## 2014-12-30 DIAGNOSIS — Z7901 Long term (current) use of anticoagulants: Secondary | ICD-10-CM | POA: Diagnosis not present

## 2014-12-30 DIAGNOSIS — M6281 Muscle weakness (generalized): Secondary | ICD-10-CM | POA: Diagnosis not present

## 2014-12-30 DIAGNOSIS — B3781 Candidal esophagitis: Secondary | ICD-10-CM | POA: Diagnosis not present

## 2014-12-30 DIAGNOSIS — E1065 Type 1 diabetes mellitus with hyperglycemia: Secondary | ICD-10-CM | POA: Diagnosis not present

## 2014-12-30 DIAGNOSIS — W19XXXD Unspecified fall, subsequent encounter: Secondary | ICD-10-CM | POA: Diagnosis not present

## 2014-12-31 ENCOUNTER — Telehealth: Payer: Self-pay | Admitting: Family

## 2014-12-31 ENCOUNTER — Encounter (HOSPITAL_COMMUNITY): Payer: Self-pay

## 2014-12-31 ENCOUNTER — Inpatient Hospital Stay (HOSPITAL_COMMUNITY)
Admission: EM | Admit: 2014-12-31 | Discharge: 2015-01-05 | DRG: 871 | Disposition: A | Discharge status: Home / Self Care | Payer: Commercial Managed Care - HMO | Attending: Internal Medicine | Admitting: Internal Medicine

## 2014-12-31 ENCOUNTER — Emergency Department (HOSPITAL_COMMUNITY): Payer: Commercial Managed Care - HMO

## 2014-12-31 DIAGNOSIS — Z6829 Body mass index (BMI) 29.0-29.9, adult: Secondary | ICD-10-CM | POA: Diagnosis not present

## 2014-12-31 DIAGNOSIS — N390 Urinary tract infection, site not specified: Secondary | ICD-10-CM | POA: Diagnosis not present

## 2014-12-31 DIAGNOSIS — Z888 Allergy status to other drugs, medicaments and biological substances status: Secondary | ICD-10-CM | POA: Diagnosis not present

## 2014-12-31 DIAGNOSIS — I5032 Chronic diastolic (congestive) heart failure: Secondary | ICD-10-CM | POA: Diagnosis present

## 2014-12-31 DIAGNOSIS — E860 Dehydration: Secondary | ICD-10-CM | POA: Diagnosis present

## 2014-12-31 DIAGNOSIS — Z7982 Long term (current) use of aspirin: Secondary | ICD-10-CM | POA: Diagnosis not present

## 2014-12-31 DIAGNOSIS — I639 Cerebral infarction, unspecified: Secondary | ICD-10-CM | POA: Diagnosis not present

## 2014-12-31 DIAGNOSIS — F332 Major depressive disorder, recurrent severe without psychotic features: Secondary | ICD-10-CM | POA: Diagnosis not present

## 2014-12-31 DIAGNOSIS — E1110 Type 2 diabetes mellitus with ketoacidosis without coma: Secondary | ICD-10-CM | POA: Diagnosis present

## 2014-12-31 DIAGNOSIS — J189 Pneumonia, unspecified organism: Secondary | ICD-10-CM | POA: Diagnosis present

## 2014-12-31 DIAGNOSIS — E785 Hyperlipidemia, unspecified: Secondary | ICD-10-CM | POA: Diagnosis not present

## 2014-12-31 DIAGNOSIS — A419 Sepsis, unspecified organism: Principal | ICD-10-CM | POA: Diagnosis present

## 2014-12-31 DIAGNOSIS — E1065 Type 1 diabetes mellitus with hyperglycemia: Secondary | ICD-10-CM | POA: Diagnosis not present

## 2014-12-31 DIAGNOSIS — Z7901 Long term (current) use of anticoagulants: Secondary | ICD-10-CM | POA: Diagnosis not present

## 2014-12-31 DIAGNOSIS — Z8672 Personal history of thrombophlebitis: Secondary | ICD-10-CM | POA: Diagnosis not present

## 2014-12-31 DIAGNOSIS — I2510 Atherosclerotic heart disease of native coronary artery without angina pectoris: Secondary | ICD-10-CM | POA: Diagnosis not present

## 2014-12-31 DIAGNOSIS — N179 Acute kidney failure, unspecified: Secondary | ICD-10-CM | POA: Diagnosis present

## 2014-12-31 DIAGNOSIS — E780 Pure hypercholesterolemia: Secondary | ICD-10-CM | POA: Diagnosis present

## 2014-12-31 DIAGNOSIS — Z7902 Long term (current) use of antithrombotics/antiplatelets: Secondary | ICD-10-CM | POA: Diagnosis not present

## 2014-12-31 DIAGNOSIS — Z8673 Personal history of transient ischemic attack (TIA), and cerebral infarction without residual deficits: Secondary | ICD-10-CM | POA: Diagnosis not present

## 2014-12-31 DIAGNOSIS — F329 Major depressive disorder, single episode, unspecified: Secondary | ICD-10-CM | POA: Diagnosis present

## 2014-12-31 DIAGNOSIS — I10 Essential (primary) hypertension: Secondary | ICD-10-CM | POA: Diagnosis present

## 2014-12-31 DIAGNOSIS — R739 Hyperglycemia, unspecified: Secondary | ICD-10-CM | POA: Diagnosis not present

## 2014-12-31 DIAGNOSIS — R112 Nausea with vomiting, unspecified: Secondary | ICD-10-CM | POA: Diagnosis not present

## 2014-12-31 DIAGNOSIS — Z952 Presence of prosthetic heart valve: Secondary | ICD-10-CM | POA: Diagnosis not present

## 2014-12-31 DIAGNOSIS — M069 Rheumatoid arthritis, unspecified: Secondary | ICD-10-CM | POA: Diagnosis present

## 2014-12-31 DIAGNOSIS — E1010 Type 1 diabetes mellitus with ketoacidosis without coma: Secondary | ICD-10-CM | POA: Diagnosis present

## 2014-12-31 DIAGNOSIS — E669 Obesity, unspecified: Secondary | ICD-10-CM | POA: Diagnosis present

## 2014-12-31 DIAGNOSIS — I252 Old myocardial infarction: Secondary | ICD-10-CM | POA: Diagnosis not present

## 2014-12-31 DIAGNOSIS — K219 Gastro-esophageal reflux disease without esophagitis: Secondary | ICD-10-CM | POA: Diagnosis present

## 2014-12-31 DIAGNOSIS — F4489 Other dissociative and conversion disorders: Secondary | ICD-10-CM | POA: Diagnosis not present

## 2014-12-31 DIAGNOSIS — IMO0002 Reserved for concepts with insufficient information to code with codable children: Secondary | ICD-10-CM | POA: Diagnosis present

## 2014-12-31 DIAGNOSIS — I6789 Other cerebrovascular disease: Secondary | ICD-10-CM | POA: Diagnosis not present

## 2014-12-31 DIAGNOSIS — Z954 Presence of other heart-valve replacement: Secondary | ICD-10-CM | POA: Diagnosis not present

## 2014-12-31 DIAGNOSIS — Z955 Presence of coronary angioplasty implant and graft: Secondary | ICD-10-CM | POA: Diagnosis not present

## 2014-12-31 LAB — URINALYSIS, ROUTINE W REFLEX MICROSCOPIC
Bilirubin Urine: NEGATIVE
Glucose, UA: >1000 mg/dL — AB
Hgb urine dipstick: NEGATIVE
Ketones, ur: >80 mg/dL — AB
Nitrite: NEGATIVE
PROTEIN: NEGATIVE mg/dL
SPECIFIC GRAVITY, URINE: 1.024 (ref 1.005–1.030)
Urobilinogen, UA: 0.2 mg/dL (ref 0.0–1.0)
pH: 5 (ref 5.0–8.0)

## 2014-12-31 LAB — URINE MICROSCOPIC-ADD ON

## 2014-12-31 LAB — BASIC METABOLIC PANEL
ANION GAP: 34 — AB (ref 5–15)
BUN: 37 mg/dL — ABNORMAL HIGH (ref 6–23)
CALCIUM: 9.5 mg/dL (ref 8.4–10.5)
CO2: 6 mmol/L — AB (ref 19–32)
Chloride: 90 mmol/L — ABNORMAL LOW (ref 96–112)
Creatinine, Ser: 2.45 mg/dL — ABNORMAL HIGH (ref 0.50–1.10)
GFR calc non Af Amer: 21 mL/min — ABNORMAL LOW (ref >90)
GFR, EST AFRICAN AMERICAN: 25 mL/min — AB (ref >90)
Glucose, Bld: 1140 mg/dL (ref 70–99)
Potassium: 6.3 mmol/L (ref 3.5–5.1)
SODIUM: 130 mmol/L — AB (ref 135–145)

## 2014-12-31 LAB — I-STAT ARTERIAL BLOOD GAS, ED
Acid-base deficit: 24 mmol/L — ABNORMAL HIGH (ref 0.0–2.0)
Bicarbonate: 4.2 mEq/L — ABNORMAL LOW (ref 20.0–24.0)
O2 Saturation: 97 %
TCO2: <5 mmol/L (ref 0–100)
pCO2 arterial: 14.2 mmHg — CL (ref 35.0–45.0)
pH, Arterial: 7.081 — CL (ref 7.350–7.450)
pO2, Arterial: 123 mmHg — ABNORMAL HIGH (ref 80.0–100.0)

## 2014-12-31 LAB — CBC WITH DIFFERENTIAL/PLATELET
Basophils Absolute: 0 10*3/uL (ref 0.0–0.1)
Basophils Relative: 0 % (ref 0–1)
Eosinophils Absolute: 0 10*3/uL (ref 0.0–0.7)
Eosinophils Relative: 0 % (ref 0–5)
HCT: 35.6 % — ABNORMAL LOW (ref 36.0–46.0)
Hemoglobin: 10.3 g/dL — ABNORMAL LOW (ref 12.0–15.0)
Lymphocytes Relative: 9 % — ABNORMAL LOW (ref 12–46)
Lymphs Abs: 2.8 10*3/uL (ref 0.7–4.0)
MCH: 26.8 pg (ref 26.0–34.0)
MCHC: 28.9 g/dL — ABNORMAL LOW (ref 30.0–36.0)
MCV: 92.5 fL (ref 78.0–100.0)
MONO ABS: 1.2 10*3/uL — AB (ref 0.1–1.0)
Monocytes Relative: 4 % (ref 3–12)
NEUTROS PCT: 87 % — AB (ref 43–77)
Neutro Abs: 26.6 10*3/uL — ABNORMAL HIGH (ref 1.7–7.7)
Platelets: 398 10*3/uL (ref 150–400)
RBC: 3.85 MIL/uL — AB (ref 3.87–5.11)
RDW: 17.5 % — ABNORMAL HIGH (ref 11.5–15.5)
WBC: 30.6 10*3/uL — ABNORMAL HIGH (ref 4.0–10.5)

## 2014-12-31 LAB — I-STAT CG4 LACTIC ACID, ED: Lactic Acid, Venous: 11.3 mmol/L (ref 0.5–2.0)

## 2014-12-31 LAB — RAPID URINE DRUG SCREEN, HOSP PERFORMED
AMPHETAMINES: NOT DETECTED
Barbiturates: NOT DETECTED
Benzodiazepines: NOT DETECTED
Cocaine: NOT DETECTED
OPIATES: NOT DETECTED
TETRAHYDROCANNABINOL: NOT DETECTED

## 2014-12-31 LAB — TYPE AND SCREEN
ABO/RH(D): A POS
ANTIBODY SCREEN: NEGATIVE

## 2014-12-31 LAB — CBG MONITORING, ED
GLUCOSE-CAPILLARY: 584 mg/dL — AB (ref 70–99)
Glucose-Capillary: >600 mg/dL (ref 70–99)
Glucose-Capillary: >600 mg/dL (ref 70–99)

## 2014-12-31 LAB — PROTIME-INR
INR: 2 — ABNORMAL HIGH (ref 0.00–1.49)
Prothrombin Time: 22.8 seconds — ABNORMAL HIGH (ref 11.6–15.2)

## 2014-12-31 LAB — RETICULOCYTES
RBC.: 3.4 MIL/uL — ABNORMAL LOW (ref 3.87–5.11)
Retic Count, Absolute: 78.2 10*3/uL (ref 19.0–186.0)
Retic Ct Pct: 2.3 % (ref 0.4–3.1)

## 2014-12-31 LAB — KETONES, QUALITATIVE

## 2014-12-31 LAB — APTT: aPTT: 31 seconds (ref 24–37)

## 2014-12-31 MED ORDER — DEXTROSE 5 % IV SOLN
1.0000 g | INTRAVENOUS | Status: DC
  Administered 2015-01-01 (×2): 1 g via INTRAVENOUS
  Filled 2014-12-31 (×3): qty 1

## 2014-12-31 MED ORDER — INSULIN ASPART 100 UNIT/ML ~~LOC~~ SOLN
10.0000 [IU] | Freq: Once | SUBCUTANEOUS | Status: AC
  Administered 2014-12-31: 10 [IU] via INTRAVENOUS
  Filled 2014-12-31: qty 1

## 2014-12-31 MED ORDER — CLOPIDOGREL BISULFATE 75 MG PO TABS
75.0000 mg | ORAL_TABLET | Freq: Every day | ORAL | Status: DC
  Administered 2015-01-01 – 2015-01-05 (×5): 75 mg via ORAL
  Filled 2014-12-31 (×5): qty 1

## 2014-12-31 MED ORDER — VANCOMYCIN HCL IN DEXTROSE 1-5 GM/200ML-% IV SOLN
1000.0000 mg | Freq: Once | INTRAVENOUS | Status: AC
  Administered 2015-01-01: 1000 mg via INTRAVENOUS
  Filled 2014-12-31: qty 200

## 2014-12-31 MED ORDER — SODIUM CHLORIDE 0.9 % IV BOLUS (SEPSIS)
1000.0000 mL | Freq: Once | INTRAVENOUS | Status: AC
  Administered 2014-12-31: 1000 mL via INTRAVENOUS

## 2014-12-31 MED ORDER — SODIUM CHLORIDE 0.9 % IV SOLN
INTRAVENOUS | Status: DC
  Administered 2015-01-01 (×2): via INTRAVENOUS

## 2014-12-31 MED ORDER — ASPIRIN 81 MG PO TABS: 81.0000 mg | ORAL_TABLET | Freq: Every day | ORAL | Status: DC

## 2014-12-31 MED ORDER — SODIUM CHLORIDE 0.9 % IV SOLN
INTRAVENOUS | Status: DC
  Administered 2014-12-31: 5.4 [IU]/h via INTRAVENOUS
  Filled 2014-12-31: qty 2.5

## 2014-12-31 MED ORDER — SODIUM CHLORIDE 0.9 % IV SOLN
80.0000 mg | Freq: Once | INTRAVENOUS | Status: AC
  Administered 2015-01-01: 80 mg via INTRAVENOUS
  Filled 2014-12-31: qty 80

## 2014-12-31 MED ORDER — LORAZEPAM 2 MG/ML IJ SOLN
1.0000 mg | Freq: Once | INTRAMUSCULAR | Status: DC
Start: 2014-12-31 — End: 2014-12-31

## 2014-12-31 MED ORDER — DARIFENACIN HYDROBROMIDE ER 7.5 MG PO TB24
7.5000 mg | ORAL_TABLET | Freq: Every day | ORAL | Status: DC
Start: 2015-01-01 — End: 2015-01-05
  Administered 2015-01-01 – 2015-01-05 (×5): 7.5 mg via ORAL
  Filled 2014-12-31 (×5): qty 1

## 2014-12-31 MED ORDER — VANCOMYCIN HCL IN DEXTROSE 750-5 MG/150ML-% IV SOLN
750.0000 mg | INTRAVENOUS | Status: DC
  Filled 2014-12-31: qty 150

## 2014-12-31 MED ORDER — ONDANSETRON HCL 4 MG/2ML IJ SOLN
4.0000 mg | Freq: Four times a day (QID) | INTRAMUSCULAR | Status: DC | PRN
  Administered 2015-01-01 (×2): 4 mg via INTRAVENOUS
  Filled 2014-12-31 (×2): qty 2

## 2014-12-31 MED ORDER — DEXTROSE-NACL 5-0.45 % IV SOLN: INTRAVENOUS | Status: DC

## 2014-12-31 MED ORDER — BACLOFEN 10 MG PO TABS: 10.0000 mg | ORAL_TABLET | Freq: Two times a day (BID) | ORAL | Status: DC

## 2014-12-31 MED ORDER — SODIUM CHLORIDE 0.9 % IV SOLN
INTRAVENOUS | Status: DC
  Filled 2014-12-31: qty 2.5

## 2014-12-31 MED ORDER — DEXTROSE-NACL 5-0.45 % IV SOLN
INTRAVENOUS | Status: DC
  Administered 2015-01-01: 07:00:00 via INTRAVENOUS

## 2014-12-31 MED ORDER — ESCITALOPRAM OXALATE 20 MG PO TABS
20.0000 mg | ORAL_TABLET | Freq: Every day | ORAL | Status: DC
  Administered 2015-01-01 – 2015-01-05 (×5): 20 mg via ORAL
  Filled 2014-12-31 (×5): qty 1

## 2014-12-31 NOTE — ED Notes (Signed)
CBG 584.

## 2014-12-31 NOTE — H&P (Signed)
[facility] History and Physical  Patient: Madison [name]  MRN: [id number]  DOB: 03-Sep-1961  DOS: the patient was seen and examined on 12/31/2014 PCP: CAMPBELL, Sherran Needs, FNP  Chief Complaint: Nausea vomiting  HPI: Madison [name] is a 54 y.o. female with Past medical history of diabetes mellitus, coronary artery disease, diastolic heart failure, aortic valvereplacement, CVA, dyslipidemia, rheumatoid arthritis on methotrexate. The patient is presenting with complaints of nausea vomiting associated with high blood sugar. Patient was recently admitted in the hospital for similar presentation with hypotension and hypovolemic shock and was discharged on Plavix Lovenox lisinopril and metoprolol. Patient was found on the floor at home and reportedly she has not been taking her insulin due to nausea and vomiting as she has not been eating. This history was obtained from ED documentation as the patient did not provide this history. Patient told me that since last 2 days she has been having nausea and vomiting also upper abdominal pain. She denies any presence of pain and now denies any chest pain denies any cough denies any diarrhea or burning urination. EMS also reported as per documentation that the patient has called them many times for episodes of hypoglycemia this week after her recent change in insulin. Patient denies any blood in her vomit or blood in her bowels. Patient denies any black color bowel movement. Patient mentions she is compliant with all medications although she mentions she is still taking Lovenox which should be completed before the end of last month.  The patient is coming from home. And at her baseline independent for most of her ADL.  Review of Systems: as mentioned in the history of present illness.  A Comprehensive review of the other systems is negative.  Past Medical History  Diagnosis Date  . CAD (coronary artery disease)   . Obesity   . Hypercholesteremia   . HTN  (hypertension)   . Dyslipidemia   . Stroke syndrome   . Aortic stenosis   . Thrombophlebitis   . Diabetes   . Hyperlipidemia   . Rheumatoid arthritis(714.0)    Past Surgical History  Procedure Laterality Date  . Cesarean section    . Foot fracture surgery    . Knee surgery    . Neuroplasty / transposition median nerve at carpal tunnel    . Left heart catheterization with coronary angiogram N/A 12/08/2014    Procedure: LEFT HEART CATHETERIZATION WITH CORONARY ANGIOGRAM;  Surgeon: Iran Ouch, MD;  Location: [facility] CATH LAB;  Service: Cardiovascular;  Laterality: N/A;  . Percutaneous coronary stent intervention (pci-s) N/A 12/09/2014    Procedure: PERCUTANEOUS CORONARY STENT INTERVENTION (PCI-S);  Surgeon: [name], MD;  Location: [facility] CATH LAB;  Service: Cardiovascular;  Laterality: N/A;   Social History:  reports that she has never smoked. She has never used smokeless tobacco. She reports that she does not drink alcohol or use illicit drugs.  Allergies  Allergen Reactions  . Celebrex [Celecoxib] Rash  . Detrol [Tolterodine] Hives    Family History  Problem Relation Age of Onset  . Stroke    . Heart disease Mother   . Heart disease Sister     Prior to Admission medications   Medication Sig Start Date End Date Taking? Authorizing Provider  aspirin 81 MG tablet Take 1 tablet (81 mg total) by mouth daily. 10/11/14   Huey Bienenstock, MD  baclofen (LIORESAL) 10 MG tablet Take 1 tablet (10 mg total) by mouth 2 (two) times daily. 10/29/14   Padonda B  Orvan Falconer, FNP  clopidogrel (PLAVIX) 75 MG tablet Take 1 tablet (75 mg total) by mouth daily with breakfast. 12/15/14   Nishant Dhungel, MD  enoxaparin (LOVENOX) 80 MG/0.8ML injection Inject 0.8 mLs (80 mg total) into the skin every 12 (twelve) hours. 12/15/14   Nishant Dhungel, MD  escitalopram (LEXAPRO) 20 MG tablet Take 20 mg by mouth daily.    Historical Provider, MD  feeding supplement, GLUCERNA SHAKE, (GLUCERNA SHAKE) LIQD Take  237 mLs by mouth 2 (two) times daily between meals. 12/15/14   Nishant Dhungel, MD  glucose blood (EMBRACE BLOOD GLUCOSE TEST) test strip Use to check blood sugar twice daily 07/16/14   Romero Belling, MD  Insulin Detemir (LEVEMIR FLEXTOUCH) 100 UNIT/ML Pen Inject 15 Units into the skin 2 (two) times daily. 12/15/14   Nishant Dhungel, MD  insulin lispro (HUMALOG KWIKPEN) 100 UNIT/ML KiwkPen Inject 6 units with evening meal 12/15/14   Nishant Dhungel, MD  lisinopril (PRINIVIL,ZESTRIL) 2.5 MG tablet Take 1 tablet (2.5 mg total) by mouth daily. 12/15/14   Nishant Dhungel, MD  methotrexate (RHEUMATREX) 2.5 MG tablet Take 1 tablet (2.5 mg total) by mouth 2 (two) times a week. Caution:Chemotherapy. Protect from light. 10/12/14   Baker Pierini, FNP  metoprolol tartrate (LOPRESSOR) 25 MG tablet Take 0.5 tablets (12.5 mg total) by mouth 2 (two) times daily. 12/15/14   Nishant Dhungel, MD  omeprazole (PRILOSEC) 40 MG capsule TAKE 1 CAPSULE (40 MG TOTAL) BY MOUTH DAILY. 11/05/14   Nelwyn Salisbury, MD  ONE TOUCH ULTRA TEST test strip TEST 5 TIMES A DAY, AND LANCETS 2/DAY 250.03 Patient taking differently: TEST 5 TIMES A DAY  250.03 10/18/14   Romero Belling, MD  pravastatin (PRAVACHOL) 20 MG tablet TAKE 1 TABLET EVERY DAY 06/01/14   Baker Pierini, FNP  promethazine (PHENERGAN) 25 MG tablet Take 1 tablet (25 mg total) by mouth every 4 (four) hours as needed for nausea or vomiting. 10/08/14   Nelwyn Salisbury, MD  solifenacin (VESICARE) 5 MG tablet Take 0.5 tablets (2.5 mg total) by mouth daily. 12/29/14   Baker Pierini, FNP  traMADol (ULTRAM) 50 MG tablet TAKE 1 OR 2 TABLETS BY MOUTH EVERY 8 HOURS AS NEEDED 08/04/14   Baker Pierini, FNP  warfarin (COUMADIN) 5 MG tablet Take 1-1.5 tablets (5-7.5 mg total) by mouth daily at 6 PM. Take 5 mg Daily By Mouth on , Tues, Thurs, Fri, Sat & Sun, Take 7.5 mg By Mouth on mondays and Wednesdays . 12/15/14   Nishant Dhungel, MD  zolpidem (AMBIEN) 10 MG tablet Take 1 tablet by  mouth at bedtime. 09/14/14   Historical Provider, MD    Physical Exam: Filed Vitals:   12/31/14 1930 12/31/14 2010 12/31/14 2020 12/31/14 2200  BP: 82/34 100/35 81/53 109/44  Pulse: 107 118 114 112  Temp:      TempSrc:      Resp: 22 25 24 21   Height:      Weight:      SpO2: 100% 100% 100% 100%    General: Alert, Awake and Oriented to Time, Place and Person. Appear in moderate distress Eyes: PERRL ENT: Oral Mucosa dark maroon deposits, dry. Neck: no JVD Cardiovascular: S1 and S2 Present, aortic systolic Murmur, Peripheral Pulses Present Respiratory: Bilateral Air entry equal and Decreased, Clear to Auscultation, noCrackles, no wheezes Abdomen: Bowel Sound present, Soft and non tender Skin: no Rash Extremities: no Pedal edema, no calf tenderness Neurologic: Grossly no focal neuro deficit.  Labs on Admission:  CBC:  Recent Labs Lab 12/31/14 1838  WBC 30.6*  NEUTROABS 26.6*  HGB 10.3*  HCT 35.6*  MCV 92.5  PLT 398    CMP     Component Value Date/Time   NA 130* 12/31/2014 1838   K 6.3* 12/31/2014 1838   CL 90* 12/31/2014 1838   CO2 6* 12/31/2014 1838   GLUCOSE 1140* 12/31/2014 1838   BUN 37* 12/31/2014 1838   CREATININE 2.45* 12/31/2014 1838   CALCIUM 9.5 12/31/2014 1838   PROT 6.0 12/14/2014 0627   ALBUMIN 2.9* 12/14/2014 0627   AST 37 12/14/2014 0627   ALT 36* 12/14/2014 0627   ALKPHOS 74 12/14/2014 0627   BILITOT 0.4 12/14/2014 0627   GFRNONAA 21* 12/31/2014 1838   GFRAA 25* 12/31/2014 1838    No results for input(s): LIPASE, AMYLASE in the last 168 hours.  No results for input(s): CKTOTAL, CKMB, CKMBINDEX, TROPONINI in the last 168 hours. BNP (last 3 results) No results for input(s): BNP in the last 8760 hours.  ProBNP (last 3 results)  Recent Labs  09/30/14 1431  PROBNP 794.3*     Radiological Exams on Admission: Dg Chest Portable 1 View  12/31/2014   CLINICAL DATA:  Hyperglycemia  EXAM: PORTABLE CHEST - 1 VIEW  COMPARISON:  December 05, 2014  FINDINGS: Lungs are clear. Heart size and pulmonary vascularity are normal. No adenopathy. Patient is status post aortic valve replacement.  IMPRESSION: No edema or consolidation.   Electronically Signed   By: Bretta Bang III M.D.   On: 12/31/2014 20:11    Assessment/Plan Principal Problem:   DKA (diabetic ketoacidoses) Active Problems:   CAD (coronary artery disease)   Rheumatoid arthritis   CVA (cerebral infarction)   Diabetes type 1, uncontrolled   S/P aortic valve replacement   Essential hypertension   Acute kidney injury   Suspected Sepsis   1. DKA (diabetic ketoacidoses) The patient is presenting with complaints of nausea vomiting with abdominal pain. At the time of my evaluation she does not have any abdominal pain and continues to have nausea and vomiting. She was found to have significantly elevated blood glucose with significantly high anion gap with lactic acidosis as well as respiratory acidosis. Most like etiology is severe dehydration secondary to DKA, possibility of seizure cannot be ruled out. Currently she does not have any new focal deficit and she feels she is at her baseline. With this the patient had a chest x-ray which is unremarkable, with possible UTI. The patient is currently being treated with broadly with vancomycin and cefepime. She has received IV insulin bolus we'll continue her on insulin drip with glucose stabilizer. She has received 2 L of normal saline bolus we'll continue aggressive rehydration. Monitor BMP every 2 hours. Monitor urine output.  2. Nausea vomiting. Patient denies any active blood. H&H at present stable. Continue monitoring H&H. Oral IV Protonix. Zofran as needed.  3. History of rheumatoid arthritis Holding methotrexate at present.  4. History of aortic wall replacement. Continuing warfarin. INR therapeutic. Continue Plavix due to recent on STEMI.  4. Acute kidney injury. Most likely secondary to dehydration  from nausea and vomiting. Continue close monitoring avoid nephrotoxic medication.  Advance goals of care discussion: Full code   DVT Prophylaxis:on chronic anticoagulation Nutrition: Nothing by mouth except medication  Disposition: Admitted to inpatient in step-down unit.  Author: Lynden Oxford, MD [facility] Pager: [phone] 12/31/2014, 11:30 PM    If 7PM-7AM, please contact night-coverage [email or web address] Password [email or web address]

## 2014-12-31 NOTE — ED Notes (Signed)
GBG reading was greater than 600.

## 2014-12-31 NOTE — ED Notes (Signed)
EMS reports that sister found pt on the floor at around 1630 this evening.  Pt has left sided deficits from previous CVA and has problems with ambulation.  Pt CBG was >500 for EMS and sister reports that pt has not been eating or taking her insulin.  Pt had stent placed yesterday.  EMS reports that they have been called out to house for hypoglycemia numerous times this week after insulin regimen being changed by new MD.

## 2014-12-31 NOTE — Telephone Encounter (Signed)
Noted  

## 2014-12-31 NOTE — ED Provider Notes (Signed)
CSN: [id number]     Arrival date & time 12/31/14  1753 History   First MD Initiated Contact with Patient 12/31/14 1759     Chief Complaint  Patient presents with  . Hyperglycemia     (Consider location/radiation/quality/duration/timing/severity/associated sxs/prior Treatment) HPI Comments: Level 5 caveat, patient is altered and does not respond appropriately to questions.  Pt is a 54 y.o. female with Type 1 DM presenting with hyperglycemia. Per EMS, has been called several times to her home the past week for low blood sugars, today found to have high sugar. Recently discharged from [facility] in mid-January for DKA and NSTEMI (had cath and stent placed). Per d/c summary had an appt with endocrinologist 2/11 however unknown if this visit occurred.   Patient is a 54 y.o. female presenting with hyperglycemia. The history is provided by the EMS personnel and medical records. The history is limited by the condition of the patient. No language interpreter was used.  Hyperglycemia Blood sugar level PTA:  >600   Past Medical History  Diagnosis Date  . CAD (coronary artery disease)   . Obesity   . Hypercholesteremia   . HTN (hypertension)   . Dyslipidemia   . Stroke syndrome   . Aortic stenosis   . Thrombophlebitis   . Diabetes   . Hyperlipidemia   . Rheumatoid arthritis(714.0)    Past Surgical History  Procedure Laterality Date  . Cesarean section    . Foot fracture surgery    . Knee surgery    . Neuroplasty / transposition median nerve at carpal tunnel    . Left heart catheterization with coronary angiogram N/A 12/08/2014    Procedure: LEFT HEART CATHETERIZATION WITH CORONARY ANGIOGRAM;  Surgeon: Iran Ouch, MD;  Location: [facility] CATH LAB;  Service: Cardiovascular;  Laterality: N/A;  . Percutaneous coronary stent intervention (pci-s) N/A 12/09/2014    Procedure: PERCUTANEOUS CORONARY STENT INTERVENTION (PCI-S);  Surgeon: [name], MD;  Location: [facility] CATH LAB;  Service:  Cardiovascular;  Laterality: N/A;   Family History  Problem Relation Age of Onset  . Stroke    . Heart disease Mother   . Heart disease Sister    History  Substance Use Topics  . Smoking status: Never Smoker   . Smokeless tobacco: Never Used  . Alcohol Use: No   OB History    No data available     Review of Systems  Unable to perform ROS     Allergies  Celebrex and Detrol  Home Medications   Prior to Admission medications   Medication Sig Start Date End Date Taking? Authorizing Provider  aspirin 81 MG tablet Take 1 tablet (81 mg total) by mouth daily. 10/11/14   Huey Bienenstock, MD  baclofen (LIORESAL) 10 MG tablet Take 1 tablet (10 mg total) by mouth 2 (two) times daily. 10/29/14   Baker Pierini, FNP  clopidogrel (PLAVIX) 75 MG tablet Take 1 tablet (75 mg total) by mouth daily with breakfast. 12/15/14   Nishant Dhungel, MD  enoxaparin (LOVENOX) 80 MG/0.8ML injection Inject 0.8 mLs (80 mg total) into the skin every 12 (twelve) hours. 12/15/14   Nishant Dhungel, MD  escitalopram (LEXAPRO) 20 MG tablet Take 20 mg by mouth daily.    Historical Provider, MD  feeding supplement, GLUCERNA SHAKE, (GLUCERNA SHAKE) LIQD Take 237 mLs by mouth 2 (two) times daily between meals. 12/15/14   Nishant Dhungel, MD  glucose blood (EMBRACE BLOOD GLUCOSE TEST) test strip Use to check blood sugar twice daily 07/16/14  Romero Belling, MD  Insulin Detemir (LEVEMIR FLEXTOUCH) 100 UNIT/ML Pen Inject 15 Units into the skin 2 (two) times daily. 12/15/14   Nishant Dhungel, MD  insulin lispro (HUMALOG KWIKPEN) 100 UNIT/ML KiwkPen Inject 6 units with evening meal 12/15/14   Nishant Dhungel, MD  lisinopril (PRINIVIL,ZESTRIL) 2.5 MG tablet Take 1 tablet (2.5 mg total) by mouth daily. 12/15/14   Nishant Dhungel, MD  methotrexate (RHEUMATREX) 2.5 MG tablet Take 1 tablet (2.5 mg total) by mouth 2 (two) times a week. Caution:Chemotherapy. Protect from light. 10/12/14   Baker Pierini, FNP  metoprolol  tartrate (LOPRESSOR) 25 MG tablet Take 0.5 tablets (12.5 mg total) by mouth 2 (two) times daily. 12/15/14   Nishant Dhungel, MD  omeprazole (PRILOSEC) 40 MG capsule TAKE 1 CAPSULE (40 MG TOTAL) BY MOUTH DAILY. 11/05/14   Nelwyn Salisbury, MD  ONE TOUCH ULTRA TEST test strip TEST 5 TIMES A DAY, AND LANCETS 2/DAY 250.03 Patient taking differently: TEST 5 TIMES A DAY  250.03 10/18/14   Romero Belling, MD  pravastatin (PRAVACHOL) 20 MG tablet TAKE 1 TABLET EVERY DAY 06/01/14   Baker Pierini, FNP  promethazine (PHENERGAN) 25 MG tablet Take 1 tablet (25 mg total) by mouth every 4 (four) hours as needed for nausea or vomiting. 10/08/14   Nelwyn Salisbury, MD  solifenacin (VESICARE) 5 MG tablet Take 0.5 tablets (2.5 mg total) by mouth daily. 12/29/14   Baker Pierini, FNP  traMADol (ULTRAM) 50 MG tablet TAKE 1 OR 2 TABLETS BY MOUTH EVERY 8 HOURS AS NEEDED 08/04/14   Baker Pierini, FNP  warfarin (COUMADIN) 5 MG tablet Take 1-1.5 tablets (5-7.5 mg total) by mouth daily at 6 PM. Take 5 mg Daily By Mouth on , Tues, Thurs, Fri, Sat & Sun, Take 7.5 mg By Mouth on mondays and Wednesdays . 12/15/14   Nishant Dhungel, MD  zolpidem (AMBIEN) 10 MG tablet Take 1 tablet by mouth at bedtime. 09/14/14   Historical Provider, MD   BP 109/44 mmHg  Pulse 112  Temp(Src) 97.2 F (36.2 C) (Rectal)  Resp 21  Ht 5\' 4"  (1.626 m)  Wt 170 lb (77.111 kg)  BMI 29.17 kg/m2  SpO2 100% Physical Exam  HENT:  Head: Normocephalic and atraumatic.  Dry mucous membranes  Eyes: Pupils are equal, round, and reactive to light.  Cardiovascular: Normal rate, regular rhythm, normal heart sounds and intact distal pulses.  Exam reveals no gallop and no friction rub.   No murmur heard. Pulmonary/Chest: Breath sounds normal.  Tachypnea but does not appear to be struggling to breath  Abdominal: Soft. Bowel sounds are normal. There is no tenderness. There is no rebound and no guarding.  Musculoskeletal: She exhibits no edema or tenderness.   Neurological: She is disoriented (speaks few words and follows some commands, does not answer all questions appropriately).  Skin: Skin is warm and dry.  Nursing note and vitals reviewed.   ED Course  Procedures (including critical care time) Labs Review Labs Reviewed  BASIC METABOLIC PANEL - Abnormal; Notable for the following:    Sodium 130 (*)    Potassium 6.3 (*)    Chloride 90 (*)    CO2 6 (*)    Glucose, Bld 1140 (*)    BUN 37 (*)    Creatinine, Ser 2.45 (*)    GFR calc non Af Amer 21 (*)    GFR calc Af Amer 25 (*)    Anion gap 34 (*)    All other  components within normal limits  URINALYSIS, ROUTINE W REFLEX MICROSCOPIC - Abnormal; Notable for the following:    Glucose, UA >1000 (*)    Ketones, ur >80 (*)    Leukocytes, UA SMALL (*)    All other components within normal limits  CBC WITH DIFFERENTIAL/PLATELET - Abnormal; Notable for the following:    WBC 30.6 (*)    RBC 3.85 (*)    Hemoglobin 10.3 (*)    HCT 35.6 (*)    MCHC 28.9 (*)    RDW 17.5 (*)    Neutrophils Relative % 87 (*)    Lymphocytes Relative 9 (*)    Neutro Abs 26.6 (*)    Monocytes Absolute 1.2 (*)    All other components within normal limits  URINE MICROSCOPIC-ADD ON - Abnormal; Notable for the following:    Squamous Epithelial / LPF FEW (*)    Bacteria, UA FEW (*)    All other components within normal limits  RETICULOCYTES - Abnormal; Notable for the following:    RBC. 3.40 (*)    All other components within normal limits  CBG MONITORING, ED - Abnormal; Notable for the following:    Glucose-Capillary >600 (*)    All other components within normal limits  I-STAT CG4 LACTIC ACID, ED - Abnormal; Notable for the following:    Lactic Acid, Venous 11.30 (*)    All other components within normal limits  I-STAT ARTERIAL BLOOD GAS, ED - Abnormal; Notable for the following:    pH, Arterial 7.081 (*)    pCO2 arterial 14.2 (*)    pO2, Arterial 123.0 (*)    Bicarbonate 4.2 (*)    Acid-base deficit  24.0 (*)    All other components within normal limits  CBG MONITORING, ED - Abnormal; Notable for the following:    Glucose-Capillary >600 (*)    All other components within normal limits  CULTURE, BLOOD (ROUTINE X 2)  CULTURE, BLOOD (ROUTINE X 2)  URINE CULTURE  BASIC METABOLIC PANEL  BASIC METABOLIC PANEL  BASIC METABOLIC PANEL  BASIC METABOLIC PANEL  BASIC METABOLIC PANEL  BASIC METABOLIC PANEL  BASIC METABOLIC PANEL  MAGNESIUM  PHOSPHORUS  KETONES, QUALITATIVE  URINE RAPID DRUG SCREEN (HOSP PERFORMED)  LACTIC ACID, PLASMA  HEPATIC FUNCTION PANEL  APTT  PROTIME-INR  TYPE AND SCREEN    Imaging Review Dg Chest Portable 1 View  12/31/2014   CLINICAL DATA:  Hyperglycemia  EXAM: PORTABLE CHEST - 1 VIEW  COMPARISON:  December 05, 2014  FINDINGS: Lungs are clear. Heart size and pulmonary vascularity are normal. No adenopathy. Patient is status post aortic valve replacement.  IMPRESSION: No edema or consolidation.   Electronically Signed   By: Bretta Bang III M.D.   On: 12/31/2014 20:11     EKG Interpretation None      MDM   Final diagnoses:  Diabetic ketoacidosis without coma associated with type 1 diabetes mellitus   ABG pH 7.081 / 14.2 / 123 / 4.2 i-stat lactate 11 BMP Sodium corrects to 146, K+ 6.3, bicarb 6, gap 34, glucose 1140  DKA, in process of rehydrating, getting second 1L NS bolus. Rcv'd 10 units insulin.   Discussed with CCM, at this time asking for hospitalist to admit. Add'tl 1L bolus and start glucostabilizer in ED, to SDU.     Nani Ravens, MD 12/31/14 2325  Nelia Shi, MD 12/31/14 [phone]

## 2014-12-31 NOTE — ED Notes (Signed)
Hold on Ativan per Dr. Radford Pax.

## 2014-12-31 NOTE — Consult Note (Addendum)
ANTIBIOTIC CONSULT NOTE - INITIAL  Pharmacy Consult for Vancomycin and Cefepime Indication: rule out sepsis  Allergies  Allergen Reactions  . Celebrex [Celecoxib] Rash  . Detrol [Tolterodine] Hives    Patient Measurements: Height: 5\' 4"  (162.6 cm) Weight: 170 lb (77.111 kg) IBW/kg (Calculated) : 54.7  Vital Signs: Temp: 97.2 F (36.2 C) (02/12 1854) Temp Source: Rectal (02/12 1854) BP: 109/44 mmHg (02/12 2200) Pulse Rate: 112 (02/12 2200) Intake/Output from previous day:   Intake/Output from this shift:    Labs:  Recent Labs  12/31/14 1838  WBC 30.6*  HGB 10.3*  PLT 398  CREATININE 2.45*   Estimated Creatinine Clearance: 26.4 mL/min (by C-G formula based on Cr of 2.45).  Microbiology: Recent Results (from the past 720 hour(s))  Culture, blood (routine x 2)     Status: None   Collection Time: 12/03/14  4:18 PM  Result Value Ref Range Status   Specimen Description BLOOD LEFT WRIST  Final   Special Requests BOTTLES DRAWN AEROBIC ONLY 12/05/14  Final   Culture   Final    NO GROWTH 5 DAYS Performed at    Report Status 12/09/2014 FINAL  Final  Culture, blood (routine x 2)     Status: None   Collection Time: 12/03/14  4:20 PM  Result Value Ref Range Status   Specimen Description BLOOD RIGHT WRIST  Final   Special Requests BOTTLES DRAWN AEROBIC AND ANAEROBIC 1.5ML  Final   Culture   Final    NO GROWTH 5 DAYS Performed at 12/05/14    Report Status 12/09/2014 FINAL  Final  MRSA PCR Screening     Status: None   Collection Time: 12/03/14 11:41 PM  Result Value Ref Range Status   MRSA by PCR NEGATIVE NEGATIVE Final    Comment:        The GeneXpert MRSA Assay (FDA approved for NASAL specimens only), is one component of a comprehensive MRSA colonization surveillance program. It is not intended to diagnose MRSA infection nor to guide or monitor treatment for MRSA infections.     Medical History: Past Medical History   Diagnosis Date  . CAD (coronary artery disease)   . Obesity   . Hypercholesteremia   . HTN (hypertension)   . Dyslipidemia   . Stroke syndrome   . Aortic stenosis   . Thrombophlebitis   . Diabetes   . Hyperlipidemia   . Rheumatoid arthritis(714.0)    Assessment: 54yof found down by her sister was brought to the ED. She was found to have CBG > 500, hypotensive, and tachycardic. WBC elevated at 30.6. She will begin empiric vancomycin and cefepime for possible sepsis. She is in acute renal failure with sCr 2.45 and CrCl ~8ml/min.  Goal of Therapy:  Vancomycin trough level 15-20 mcg/ml  Plan:  1) Vancomycin 1g x 1 then 750mg  q24 2) Cefepime 1g q24 3) Follow renal function, cultures, LOT, level if needed  38m 12/31/2014,10:38 PM   Addendum: Renal function improved with SCr down to 1.75, est CrCl ~35 ml/min.  Increase vancomycin to 1000 mg IV q24h  [facility], 03/01/2015.D., BCPS Clinical Pharmacist Pager: [phone] 01/01/2015 10:12 AM

## 2014-12-31 NOTE — ED Notes (Addendum)
IV access obtained by IV team. Liter of fluid running in currently.

## 2014-12-31 NOTE — Telephone Encounter (Signed)
Caitlyn rn bsn from [facility] can not get in touch with pt for home visit today. Caitlyn will try on Monday.

## 2015-01-01 DIAGNOSIS — A419 Sepsis, unspecified organism: Principal | ICD-10-CM

## 2015-01-01 DIAGNOSIS — Z954 Presence of other heart-valve replacement: Secondary | ICD-10-CM

## 2015-01-01 DIAGNOSIS — E1010 Type 1 diabetes mellitus with ketoacidosis without coma: Secondary | ICD-10-CM

## 2015-01-01 DIAGNOSIS — N179 Acute kidney failure, unspecified: Secondary | ICD-10-CM

## 2015-01-01 DIAGNOSIS — M069 Rheumatoid arthritis, unspecified: Secondary | ICD-10-CM

## 2015-01-01 DIAGNOSIS — I10 Essential (primary) hypertension: Secondary | ICD-10-CM

## 2015-01-01 LAB — ABO/RH: ABO/RH(D): A POS

## 2015-01-01 LAB — HEPATIC FUNCTION PANEL
ALBUMIN: 3.5 g/dL (ref 3.5–5.2)
ALT: 16 U/L (ref 0–35)
AST: 18 U/L (ref 0–37)
Alkaline Phosphatase: 84 U/L (ref 39–117)
BILIRUBIN TOTAL: 1.3 mg/dL — AB (ref 0.3–1.2)
TOTAL PROTEIN: 6.3 g/dL (ref 6.0–8.3)

## 2015-01-01 LAB — BASIC METABOLIC PANEL
ANION GAP: 18 — AB (ref 5–15)
ANION GAP: 7 (ref 5–15)
ANION GAP: 8 (ref 5–15)
Anion gap: 21 — ABNORMAL HIGH (ref 5–15)
Anion gap: 15 (ref 5–15)
Anion gap: 10 (ref 5–15)
Anion gap: 9 (ref 5–15)
BUN: 44 mg/dL — ABNORMAL HIGH (ref 6–23)
BUN: 43 mg/dL — ABNORMAL HIGH (ref 6–23)
BUN: 43 mg/dL — AB (ref 6–23)
BUN: 38 mg/dL — ABNORMAL HIGH (ref 6–23)
BUN: 34 mg/dL — ABNORMAL HIGH (ref 6–23)
BUN: 37 mg/dL — ABNORMAL HIGH (ref 6–23)
BUN: 33 mg/dL — AB (ref 6–23)
CALCIUM: 8.5 mg/dL (ref 8.4–10.5)
CALCIUM: 8.4 mg/dL (ref 8.4–10.5)
CHLORIDE: 101 mmol/L (ref 96–112)
CHLORIDE: 114 mmol/L — AB (ref 96–112)
CHLORIDE: 114 mmol/L — AB (ref 96–112)
CO2: 13 mmol/L — AB (ref 19–32)
CO2: 12 mmol/L — ABNORMAL LOW (ref 19–32)
CO2: 16 mmol/L — AB (ref 19–32)
CO2: 19 mmol/L (ref 19–32)
CO2: 24 mmol/L (ref 19–32)
CO2: 22 mmol/L (ref 19–32)
CO2: 22 mmol/L (ref 19–32)
CREATININE: 1.96 mg/dL — AB (ref 0.50–1.10)
CREATININE: 1.75 mg/dL — AB (ref 0.50–1.10)
Calcium: 8.2 mg/dL — ABNORMAL LOW (ref 8.4–10.5)
Calcium: 8.3 mg/dL — ABNORMAL LOW (ref 8.4–10.5)
Calcium: 8.5 mg/dL (ref 8.4–10.5)
Calcium: 8.3 mg/dL — ABNORMAL LOW (ref 8.4–10.5)
Calcium: 8.6 mg/dL (ref 8.4–10.5)
Chloride: 106 mmol/L (ref 96–112)
Chloride: 107 mmol/L (ref 96–112)
Chloride: 111 mmol/L (ref 96–112)
Chloride: 115 mmol/L — ABNORMAL HIGH (ref 96–112)
Creatinine, Ser: 2.38 mg/dL — ABNORMAL HIGH (ref 0.50–1.10)
Creatinine, Ser: 2.23 mg/dL — ABNORMAL HIGH (ref 0.50–1.10)
Creatinine, Ser: 1.34 mg/dL — ABNORMAL HIGH (ref 0.50–1.10)
Creatinine, Ser: 1.37 mg/dL — ABNORMAL HIGH (ref 0.50–1.10)
Creatinine, Ser: 1.28 mg/dL — ABNORMAL HIGH (ref 0.50–1.10)
GFR calc Af Amer: 25 mL/min — ABNORMAL LOW (ref >90)
GFR calc Af Amer: 28 mL/min — ABNORMAL LOW (ref >90)
GFR calc Af Amer: 32 mL/min — ABNORMAL LOW (ref >90)
GFR calc Af Amer: 37 mL/min — ABNORMAL LOW (ref >90)
GFR calc Af Amer: 50 mL/min — ABNORMAL LOW (ref >90)
GFR calc Af Amer: 54 mL/min — ABNORMAL LOW (ref >90)
GFR calc non Af Amer: 24 mL/min — ABNORMAL LOW (ref >90)
GFR calc non Af Amer: 28 mL/min — ABNORMAL LOW (ref >90)
GFR calc non Af Amer: 44 mL/min — ABNORMAL LOW (ref >90)
GFR calc non Af Amer: 43 mL/min — ABNORMAL LOW (ref >90)
GFR calc non Af Amer: 47 mL/min — ABNORMAL LOW (ref >90)
GFR, EST AFRICAN AMERICAN: 51 mL/min — AB (ref >90)
GFR, EST NON AFRICAN AMERICAN: 22 mL/min — AB (ref >90)
GFR, EST NON AFRICAN AMERICAN: 32 mL/min — AB (ref >90)
GLUCOSE: 462 mg/dL — AB (ref 70–99)
Glucose, Bld: 950 mg/dL (ref 70–99)
Glucose, Bld: 787 mg/dL (ref 70–99)
Glucose, Bld: 621 mg/dL (ref 70–99)
Glucose, Bld: 152 mg/dL — ABNORMAL HIGH (ref 70–99)
Glucose, Bld: 153 mg/dL — ABNORMAL HIGH (ref 70–99)
Glucose, Bld: 131 mg/dL — ABNORMAL HIGH (ref 70–99)
POTASSIUM: 4.9 mmol/L (ref 3.5–5.1)
POTASSIUM: 3.9 mmol/L (ref 3.5–5.1)
POTASSIUM: 4 mmol/L (ref 3.5–5.1)
Potassium: 4.7 mmol/L (ref 3.5–5.1)
Potassium: 4 mmol/L (ref 3.5–5.1)
Potassium: 3.6 mmol/L (ref 3.5–5.1)
Potassium: 3.9 mmol/L (ref 3.5–5.1)
SODIUM: 145 mmol/L (ref 135–145)
SODIUM: 145 mmol/L (ref 135–145)
Sodium: 135 mmol/L (ref 135–145)
Sodium: 136 mmol/L (ref 135–145)
Sodium: 138 mmol/L (ref 135–145)
Sodium: 140 mmol/L (ref 135–145)
Sodium: 145 mmol/L (ref 135–145)

## 2015-01-01 LAB — CBC WITH DIFFERENTIAL/PLATELET
Basophils Absolute: 0 10*3/uL (ref 0.0–0.1)
Basophils Relative: 0 % (ref 0–1)
Eosinophils Absolute: 0 10*3/uL (ref 0.0–0.7)
Eosinophils Relative: 0 % (ref 0–5)
HCT: 25 % — ABNORMAL LOW (ref 36.0–46.0)
Hemoglobin: 8.4 g/dL — ABNORMAL LOW (ref 12.0–15.0)
Lymphocytes Relative: 11 % — ABNORMAL LOW (ref 12–46)
Lymphs Abs: 2.6 10*3/uL (ref 0.7–4.0)
MCH: 27 pg (ref 26.0–34.0)
MCHC: 33.6 g/dL (ref 30.0–36.0)
MCV: 80.4 fL (ref 78.0–100.0)
Monocytes Absolute: 1.7 10*3/uL — ABNORMAL HIGH (ref 0.1–1.0)
Monocytes Relative: 7 % (ref 3–12)
Neutro Abs: 19.5 10*3/uL — ABNORMAL HIGH (ref 1.7–7.7)
Neutrophils Relative %: 82 % — ABNORMAL HIGH (ref 43–77)
Platelets: 286 10*3/uL (ref 150–400)
RBC: 3.11 MIL/uL — ABNORMAL LOW (ref 3.87–5.11)
RDW: 16.1 % — ABNORMAL HIGH (ref 11.5–15.5)
WBC: 23.9 10*3/uL — ABNORMAL HIGH (ref 4.0–10.5)

## 2015-01-01 LAB — PROTIME-INR
INR: 2.38 — ABNORMAL HIGH (ref 0.00–1.49)
Prothrombin Time: 26.1 seconds — ABNORMAL HIGH (ref 11.6–15.2)

## 2015-01-01 LAB — GLUCOSE, CAPILLARY
GLUCOSE-CAPILLARY: 401 mg/dL — AB (ref 70–99)
GLUCOSE-CAPILLARY: 126 mg/dL — AB (ref 70–99)
GLUCOSE-CAPILLARY: 415 mg/dL — AB (ref 70–99)
Glucose-Capillary: >600 mg/dL (ref 70–99)
Glucose-Capillary: 551 mg/dL (ref 70–99)
Glucose-Capillary: 438 mg/dL — ABNORMAL HIGH (ref 70–99)
Glucose-Capillary: 331 mg/dL — ABNORMAL HIGH (ref 70–99)
Glucose-Capillary: 257 mg/dL — ABNORMAL HIGH (ref 70–99)
Glucose-Capillary: 178 mg/dL — ABNORMAL HIGH (ref 70–99)
Glucose-Capillary: 144 mg/dL — ABNORMAL HIGH (ref 70–99)
Glucose-Capillary: 121 mg/dL — ABNORMAL HIGH (ref 70–99)
Glucose-Capillary: 100 mg/dL — ABNORMAL HIGH (ref 70–99)
Glucose-Capillary: 105 mg/dL — ABNORMAL HIGH (ref 70–99)
Glucose-Capillary: 115 mg/dL — ABNORMAL HIGH (ref 70–99)

## 2015-01-01 LAB — MAGNESIUM: Magnesium: 2.4 mg/dL (ref 1.5–2.5)

## 2015-01-01 LAB — PHOSPHORUS: Phosphorus: 5 mg/dL — ABNORMAL HIGH (ref 2.3–4.6)

## 2015-01-01 LAB — LACTIC ACID, PLASMA: LACTIC ACID, VENOUS: 5.7 mmol/L — AB (ref 0.5–2.0)

## 2015-01-01 MED ORDER — SODIUM CHLORIDE 0.9 % IV SOLN
INTRAVENOUS | Status: DC
  Administered 2015-01-01: 250 mL via INTRAVENOUS

## 2015-01-01 MED ORDER — ASPIRIN EC 81 MG PO TBEC
81.0000 mg | DELAYED_RELEASE_TABLET | Freq: Every day | ORAL | Status: DC
  Administered 2015-01-01 – 2015-01-05 (×5): 81 mg via ORAL
  Filled 2015-01-01 (×5): qty 1

## 2015-01-01 MED ORDER — WARFARIN - PHARMACIST DOSING INPATIENT
Freq: Every day | Status: DC
Start: 2015-01-01 — End: 2015-01-05

## 2015-01-01 MED ORDER — INSULIN GLARGINE 100 UNIT/ML ~~LOC~~ SOLN
10.0000 [IU] | Freq: Every day | SUBCUTANEOUS | Status: DC
  Administered 2015-01-01: 10 [IU] via SUBCUTANEOUS
  Filled 2015-01-01 (×2): qty 0.1

## 2015-01-01 MED ORDER — INSULIN GLARGINE 100 UNIT/ML ~~LOC~~ SOLN
10.0000 [IU] | Freq: Once | SUBCUTANEOUS | Status: AC
  Administered 2015-01-01: 10 [IU] via SUBCUTANEOUS
  Filled 2015-01-01: qty 0.1

## 2015-01-01 MED ORDER — WARFARIN SODIUM 10 MG PO TABS
10.0000 mg | ORAL_TABLET | Freq: Once | ORAL | Status: AC
  Administered 2015-01-01: 10 mg via ORAL
  Filled 2015-01-01: qty 1

## 2015-01-01 MED ORDER — INSULIN ASPART 100 UNIT/ML ~~LOC~~ SOLN
17.0000 [IU] | Freq: Once | SUBCUTANEOUS | Status: AC
  Administered 2015-01-01: 17 [IU] via SUBCUTANEOUS

## 2015-01-01 MED ORDER — SODIUM CHLORIDE 0.9 % IV SOLN
INTRAVENOUS | Status: DC
  Administered 2015-01-01 – 2015-01-03 (×3): via INTRAVENOUS

## 2015-01-01 MED ORDER — INSULIN ASPART 100 UNIT/ML ~~LOC~~ SOLN
0.0000 [IU] | Freq: Three times a day (TID) | SUBCUTANEOUS | Status: DC
  Administered 2015-01-02: 5 [IU] via SUBCUTANEOUS
  Administered 2015-01-02: 11 [IU] via SUBCUTANEOUS
  Administered 2015-01-03 (×2): 2 [IU] via SUBCUTANEOUS
  Administered 2015-01-04: 3 [IU] via SUBCUTANEOUS
  Administered 2015-01-05: 2 [IU] via SUBCUTANEOUS
  Administered 2015-01-05: 5 [IU] via SUBCUTANEOUS

## 2015-01-01 MED ORDER — PANTOPRAZOLE SODIUM 40 MG PO TBEC
40.0000 mg | DELAYED_RELEASE_TABLET | Freq: Every day | ORAL | Status: DC
Start: 2015-01-01 — End: 2015-01-05
  Administered 2015-01-01 – 2015-01-04 (×4): 40 mg via ORAL
  Filled 2015-01-01 (×4): qty 1

## 2015-01-01 MED ORDER — VANCOMYCIN HCL IN DEXTROSE 1-5 GM/200ML-% IV SOLN
1000.0000 mg | INTRAVENOUS | Status: DC
  Administered 2015-01-02: 1000 mg via INTRAVENOUS
  Filled 2015-01-01: qty 200

## 2015-01-01 NOTE — ED Notes (Signed)
CBG Taken = High

## 2015-01-01 NOTE — Progress Notes (Signed)
Anion gap 10.  Paged NP Lynch to inform of same. Ivery Quale, RN

## 2015-01-01 NOTE — Progress Notes (Signed)
[facility] PROGRESS NOTE  Madison [name] YWV:[id number] DOB: 03-25-1961 DOA: 12/31/2014 PCP: Janell Quiet, FNP  Assessment/Plan: 1-sepsis: due to UTI and presumed PNA -patient with recent hospitalization and hx of RA on methotrexate  -continue coverage with broad spectrum antibiotics -follow cx's data -continue IVF's -minimize narcotics and benzos -currently afebrile  2-DKA: anion gap 8; bicarb 22, sugar X 4 < 200 -will transition off insulin drip, advance diet, start lantus and SSI -will follow CBG's  3-uncontrolled diabetes: presenting on DKA -will adjust medication dose and follow CBG's -A1C pending -started on lantus and SSI now that insulin drip is off -low carb diet  4-AKI: pre-renal in nature with DKA -continue IVF's -Cr trending down  5-GERD: continue protonix  6-depression: continue lexapro  7-recent aortic wall replacement and STEMI -will continue plavix and coumadin per pharmacy  8-hx of RA: holding methotrexate for now  Code Status: Full Family Communication: no family at bedside Disposition Plan: remains in stepdown   Consultants:  None   Procedures:  See below for x-ray reports   Antibiotics:  Vancomycin and cefepime   HPI/Subjective: Patient is somnolent but easily aroused. No CP or SOB.  Objective: Filed Vitals:   01/01/15 1200  BP:   Pulse:   Temp: 99.7 F (37.6 C)  Resp:     Intake/Output Summary (Last 24 hours) at 01/01/15 1219 Last data filed at 01/01/15 1130  Gross per 24 hour  Intake 1967.34 ml  Output      0 ml  Net 1967.34 ml   Filed Weights   12/31/14 1822 01/01/15 0135  Weight: 77.111 kg (170 lb) 72.8 kg (160 lb 7.9 oz)    Exam:   General:  Slightly somnolent, but able to follow commands and answer questions. No fever  Cardiovascular: S1 and S2, positive murmur, no rubs or gallops  Respiratory: scattered rhonchi, no wheezing  Abdomen: soft, NT, ND, positive BS  Musculoskeletal: trace to  1++ edema bilaterally, no cyanosis   Data Reviewed: Basic Metabolic Panel:  Recent Labs Lab 12/31/14 2256  01/01/15 0241 01/01/15 0426 01/01/15 0730 01/01/15 0830 01/01/15 1019  NA 135  < > 138 140 145 145 145  K 4.7  < > 4.0 3.9 3.6 4.0 3.9  CL 101  < > 107 111 114* 114* 115*  CO2 13*  < > 16* 19 24 22 22   GLUCOSE 950*  < > 621* 462* 152* 153* 131*  BUN 44*  < > 43* 38* 34* 37* 33*  CREATININE 2.38*  < > 1.96* 1.75* 1.34* 1.37* 1.28*  CALCIUM 8.5  < > 8.3* 8.5 8.3* 8.6 8.4  MG 2.4  --   --   --   --   --   --   PHOS 5.0*  --   --   --   --   --   --   < > = values in this interval not displayed. Liver Function Tests:  Recent Labs Lab 12/31/14 2256  AST 18  ALT 16  ALKPHOS 84  BILITOT 1.3*  PROT 6.3  ALBUMIN 3.5   CBC:  Recent Labs Lab 12/31/14 1838 01/01/15 0730  WBC 30.6* 23.9*  NEUTROABS 26.6* 19.5*  HGB 10.3* 8.4*  HCT 35.6* 25.0*  MCV 92.5 80.4  PLT 398 286   ProBNP (last 3 results)  Recent Labs  09/30/14 1431  PROBNP 794.3*    CBG:  Recent Labs Lab 01/01/15 0720 01/01/15 0826 01/01/15 0939 01/01/15 1045 01/01/15 1150  GLUCAP 178*  144* 126* 121* 100*    Studies: Dg Chest Portable 1 View  12/31/2014   CLINICAL DATA:  Hyperglycemia  EXAM: PORTABLE CHEST - 1 VIEW  COMPARISON:  December 05, 2014  FINDINGS: Lungs are clear. Heart size and pulmonary vascularity are normal. No adenopathy. Patient is status post aortic valve replacement.  IMPRESSION: No edema or consolidation.   Electronically Signed   By: Bretta Bang III M.D.   On: 12/31/2014 20:11    Scheduled Meds: . aspirin EC  81 mg Oral Daily  . ceFEPime (MAXIPIME) IV  1 g Intravenous Q24H  . clopidogrel  75 mg Oral Q breakfast  . darifenacin  7.5 mg Oral Daily  . escitalopram  20 mg Oral Daily  . insulin aspart  0-15 Units Subcutaneous TID WC  . insulin glargine  10 Units Subcutaneous Once  . insulin glargine  10 Units Subcutaneous QHS  . [START ON 01/02/2015] vancomycin   1,000 mg Intravenous Q24H  . warfarin  10 mg Oral ONCE-1800  . Warfarin - Pharmacist Dosing Inpatient   Does not apply q1800   Continuous Infusions: . sodium chloride 250 mL (01/01/15 0135)  . sodium chloride    . insulin (NOVOLIN-R) infusion 1.6 Units/hr (01/01/15 1130)    Principal Problem:   DKA (diabetic ketoacidoses) Active Problems:   CAD (coronary artery disease)   Rheumatoid arthritis   CVA (cerebral infarction)   Diabetes type 1, uncontrolled   S/P aortic valve replacement   Essential hypertension   Acute kidney injury   Suspected Sepsis    Time spent: 35 minutes (> 50% of time dedicated to care coordination and face to face examination)    Vassie Loll  [facility] Pager [phone]. If 7PM-7AM, please contact night-coverage at [email or web address], password [facility] 01/01/2015, 12:19 PM  LOS: 1 day

## 2015-01-01 NOTE — Progress Notes (Signed)
ANTICOAGULATION CONSULT NOTE - Initial Consult  Pharmacy Consult for Coumadin Indication: AVR  Allergies  Allergen Reactions  . Celebrex [Celecoxib] Rash  . Detrol [Tolterodine] Hives    Patient Measurements: Height: 5\' 4"  (162.6 cm) Weight: 170 lb (77.111 kg) IBW/kg (Calculated) : 54.7  Vital Signs: Temp: 97.2 F (36.2 C) (02/12 1854) Temp Source: Rectal (02/12 1854) BP: 109/40 mmHg (02/13 0100) Pulse Rate: 112 (02/13 0100)  Labs:  Recent Labs  12/31/14 1838 12/31/14 2256  HGB 10.3*  --   HCT 35.6*  --   PLT 398  --   APTT  --  31  LABPROT  --  22.8*  INR  --  2.00*  CREATININE 2.45* 2.38*    Estimated Creatinine Clearance: 27.2 mL/min (by C-G formula based on Cr of 2.38).   Medical History: Past Medical History  Diagnosis Date  . CAD (coronary artery disease)   . Obesity   . Hypercholesteremia   . HTN (hypertension)   . Dyslipidemia   . Stroke syndrome   . Aortic stenosis   . Thrombophlebitis   . Diabetes   . Hyperlipidemia   . Rheumatoid arthritis(714.0)      Assessment: 54yo female was found on floor by sister, has deficits 2/2 prior CVA w/ difficulty ambulating, CBG was >500, EMS reports being called several times this week for hypoglycemia, being admitted for DKA and possible sepsis, to continue Coumadin for h/o AVR; current INR is below goal w/ records of multiple recent INRs being below goal w/ outpt visits.  Goal of Therapy:  INR 2.5-3.5   Plan:  Will give boosted dose of Coumadin 10mg  po x1 today and monitor INR for dose adjustments.  54yo, PharmD, BCPS  01/01/2015,1:24 AM

## 2015-01-01 NOTE — Progress Notes (Signed)
CBG 415, paged NP Lynch to inform. New orders received.  Ivery Quale, RN

## 2015-01-02 DIAGNOSIS — E1065 Type 1 diabetes mellitus with hyperglycemia: Secondary | ICD-10-CM

## 2015-01-02 LAB — BASIC METABOLIC PANEL
ANION GAP: 11 (ref 5–15)
BUN: 24 mg/dL — AB (ref 6–23)
CHLORIDE: 111 mmol/L (ref 96–112)
CO2: 15 mmol/L — AB (ref 19–32)
Calcium: 8.5 mg/dL (ref 8.4–10.5)
Creatinine, Ser: 1.24 mg/dL — ABNORMAL HIGH (ref 0.50–1.10)
GFR calc Af Amer: 56 mL/min — ABNORMAL LOW (ref >90)
GFR, EST NON AFRICAN AMERICAN: 48 mL/min — AB (ref >90)
Glucose, Bld: 301 mg/dL — ABNORMAL HIGH (ref 70–99)
Potassium: 4 mmol/L (ref 3.5–5.1)
Sodium: 137 mmol/L (ref 135–145)

## 2015-01-02 LAB — URINE CULTURE
COLONY COUNT: NO GROWTH
Culture: NO GROWTH

## 2015-01-02 LAB — GLUCOSE, CAPILLARY
GLUCOSE-CAPILLARY: 185 mg/dL — AB (ref 70–99)
GLUCOSE-CAPILLARY: 185 mg/dL — AB (ref 70–99)
GLUCOSE-CAPILLARY: 204 mg/dL — AB (ref 70–99)
GLUCOSE-CAPILLARY: 484 mg/dL — AB (ref 70–99)
Glucose-Capillary: 319 mg/dL — ABNORMAL HIGH (ref 70–99)
Glucose-Capillary: 232 mg/dL — ABNORMAL HIGH (ref 70–99)
Glucose-Capillary: 341 mg/dL — ABNORMAL HIGH (ref 70–99)

## 2015-01-02 LAB — CBC
HCT: 23.6 % — ABNORMAL LOW (ref 36.0–46.0)
Hemoglobin: 7.6 g/dL — ABNORMAL LOW (ref 12.0–15.0)
MCH: 27.2 pg (ref 26.0–34.0)
MCHC: 32.2 g/dL (ref 30.0–36.0)
MCV: 84.6 fL (ref 78.0–100.0)
PLATELETS: 220 10*3/uL (ref 150–400)
RBC: 2.79 MIL/uL — AB (ref 3.87–5.11)
RDW: 16.7 % — ABNORMAL HIGH (ref 11.5–15.5)
WBC: 17 10*3/uL — AB (ref 4.0–10.5)

## 2015-01-02 LAB — PROTIME-INR
INR: 4.74 — AB (ref 0.00–1.49)
Prothrombin Time: 44.9 seconds — ABNORMAL HIGH (ref 11.6–15.2)

## 2015-01-02 MED ORDER — ZOLPIDEM TARTRATE 5 MG PO TABS
5.0000 mg | ORAL_TABLET | Freq: Every evening | ORAL | Status: DC | PRN
  Administered 2015-01-02 – 2015-01-04 (×2): 5 mg via ORAL
  Filled 2015-01-02 (×3): qty 1

## 2015-01-02 MED ORDER — INSULIN GLARGINE 100 UNIT/ML ~~LOC~~ SOLN
18.0000 [IU] | Freq: Every day | SUBCUTANEOUS | Status: DC
  Administered 2015-01-02 – 2015-01-04 (×2): 18 [IU] via SUBCUTANEOUS
  Filled 2015-01-02 (×4): qty 0.18

## 2015-01-02 MED ORDER — SODIUM BICARBONATE 650 MG PO TABS
650.0000 mg | ORAL_TABLET | Freq: Two times a day (BID) | ORAL | Status: AC
  Administered 2015-01-02 – 2015-01-04 (×4): 650 mg via ORAL
  Filled 2015-01-02 (×5): qty 1

## 2015-01-02 MED ORDER — CEFTRIAXONE SODIUM IN DEXTROSE 20 MG/ML IV SOLN
1.0000 g | INTRAVENOUS | Status: DC
  Administered 2015-01-02 – 2015-01-03 (×2): 1 g via INTRAVENOUS
  Filled 2015-01-02 (×3): qty 50

## 2015-01-02 MED ORDER — INSULIN ASPART 100 UNIT/ML ~~LOC~~ SOLN
3.0000 [IU] | Freq: Three times a day (TID) | SUBCUTANEOUS | Status: DC
  Administered 2015-01-02 – 2015-01-05 (×8): 3 [IU] via SUBCUTANEOUS

## 2015-01-02 MED ORDER — POTASSIUM CHLORIDE CRYS ER 20 MEQ PO TBCR
20.0000 meq | EXTENDED_RELEASE_TABLET | Freq: Once | ORAL | Status: AC
  Administered 2015-01-02: 20 meq via ORAL
  Filled 2015-01-02: qty 1

## 2015-01-02 NOTE — Evaluation (Signed)
Physical Therapy Evaluation Patient Details Name: Madison [name] MRN: [id number] DOB: [birth year] Today's Date: 01/02/2015   History of Present Illness  54 y.o. female with h/o type 1 DM, Multiple CVA, aortic valve replacement, admitted with DKA, and sepsis: due to UTI and presumed PNA.  Clinical Impression  Pt admitted with the above diagnosis. Pt currently with functional limitations due to the deficits listed below (see PT Problem List). Limited assessment due to elevated INR, at risk for hemarthrosis with levels >4.0 and therefore limited to transfer training only today. She performs this task at a supervision level without need for physical assist. She will have 24 hour care at d/c and I anticipate she will progress well towards PT goals. Pt will benefit from skilled PT to increase their independence and safety with mobility to allow discharge to the venue listed below.       Follow Up Recommendations Home health PT    Equipment Recommendations  None recommended by PT    Recommendations for Other Services       Precautions / Restrictions Precautions Precautions: Fall Restrictions Weight Bearing Restrictions: No      Mobility  Bed Mobility Overal bed mobility: Modified Independent                Transfers Overall transfer level: Needs assistance Equipment used: None Transfers: Sit to/from Stand;Stand Pivot Transfers Sit to Stand: Supervision Stand pivot transfers: Supervision       General transfer comment: Supervision for safety. Requires extra time. Did not need physical assist to complete.  Ambulation/Gait                Stairs            Wheelchair Mobility    Modified Rankin (Stroke Patients Only)       Balance Overall balance assessment: Needs assistance Sitting-balance support: No upper extremity supported;Feet supported Sitting balance-Leahy Scale: Good     Standing balance support: No upper extremity supported Standing  balance-Leahy Scale: Fair                               Pertinent Vitals/Pain Pain Assessment: No/denies pain    Home Living Family/patient expects to be discharged to:: Private residence Living Arrangements: Alone Available Help at Discharge: Family;Available 24 hours/day (Staying wih her sister who is home 24/7) Type of Home: House Home Access: Stairs to enter Entrance Stairs-Rails: Right Entrance Stairs-Number of Steps: 2 Home Layout: Two level;Full bath on main level;Able to live on main level with bedroom/bathroom Home Equipment: Gilmer Mor - single point;Walker - 2 wheels;Bedside commode Additional Comments: Plans to stay with her sister at d/c who will care for her 24/7    Prior Function Level of Independence: Independent         Comments: States she was receiving OT/PT at home and has progressed to ambulation without an assistive device.     Hand Dominance   Dominant Hand: Right    Extremity/Trunk Assessment   Upper Extremity Assessment: Defer to OT evaluation               LLE Deficits / Details: Lt ankle dorsiflexion AROM approx -10 degrees from neutral. Grossly 3+/5 and greater with LE strength.     Communication   Communication: Expressive difficulties  Cognition Arousal/Alertness: Awake/alert Behavior During Therapy: WFL for tasks assessed/performed Overall Cognitive Status: Within Functional Limits for tasks assessed  General Comments General comments (skin integrity, edema, etc.): Gait not assessed due to elevated INR >4.0     Exercises General Exercises - Lower Extremity Ankle Circles/Pumps: AROM;Both;10 reps;Seated      Assessment/Plan    PT Assessment Patient needs continued PT services  PT Diagnosis Generalized weakness   PT Problem List Decreased strength;Decreased range of motion;Decreased activity tolerance;Decreased balance;Decreased mobility  PT Treatment Interventions DME instruction;Gait  training;Stair training;Functional mobility training;Therapeutic activities;Therapeutic exercise;Balance training;Neuromuscular re-education;Patient/family education   PT Goals (Current goals can be found in the Care Plan section) Acute Rehab PT Goals Patient Stated Goal: Get better PT Goal Formulation: With patient Time For Goal Achievement: 01/16/15 Potential to Achieve Goals: Good    Frequency Min 3X/week   Barriers to discharge        Co-evaluation               End of Session Equipment Utilized During Treatment: Gait belt Activity Tolerance: Treatment limited secondary to medical complications (Comment) (elevated INR - Risk of hemarthrosis) Patient left: in chair;with call bell/phone within reach;Other (comment) (Alarm not located in room. Unable to reach RN via telephone) Nurse Communication: Mobility status         Time: 1355-1414 PT Time Calculation (min) (ACUTE ONLY): 19 min   Charges:   PT Evaluation $Initial PT Evaluation Tier I: 1 Procedure     PT G CodesBerton Mount 01/02/2015, 2:25 PM Sunday Spillers [place], [place] [phone]

## 2015-01-02 NOTE — Progress Notes (Signed)
Madison [name] is a 54 y.o. female patient who transferred  from 2H19 awake, alert  & orientated  X 3, Full Code, VSS - Blood pressure 148/47, pulse 84, temperature 98.1 F (36.7 C), temperature source Oral, resp. rate 20, height 5\' 3"  (1.6 m), weight 72.8 kg (160 lb 7.9 oz), SpO2 100 %., R/A, no c/o shortness of breath, no c/o chest pain, no distress noted.  Pt. 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  Non-Tele.   IV site WDL: forearm right with IVF infusing & left AC NSL condition patent and no redness with a transparent dsg that's clean dry and intact.  Allergies:   Allergies  Allergen Reactions  . Celebrex [Celecoxib] Rash  . Detrol [Tolterodine] Hives     Past Medical History  Diagnosis Date  . CAD (coronary artery disease)   . Obesity   . Hypercholesteremia   . HTN (hypertension)   . Dyslipidemia   . Stroke syndrome   . Aortic stenosis   . Thrombophlebitis   . Diabetes   . Hyperlipidemia   . Rheumatoid arthritis(714.0)     Pt orientation to unit, room and routine. SR up x 2, fall risk assessment complete with Patient and family verbalizing understanding of risks associated with falls. Pt verbalizes an understanding of how to use the call bell and to call for help before getting out of bed.  Skin, clean-dry- intact without evidence of bruising, or skin tears.   No evidence of skin break down noted on exam.     Will cont to monitor and assist as needed.  , RN 01/02/2015 12:34 PM

## 2015-01-02 NOTE — Progress Notes (Signed)
[facility] PROGRESS NOTE  Madison [name] NKN:[id number] DOB: 1961-02-15 DOA: 12/31/2014 PCP: Janell Quiet, FNP  Assessment/Plan: 1-sepsis: due to UTI and presumed PNA -patient with recent hospitalization and hx of RA on methotrexate  -will narrow antibiotics to rocephin -follow cx's data -continue IVF's -minimize narcotics and benzos -WBC's trending down  2-DKA: anion gap 11; sugar rising. -off insulin drip, tolerating diet, continue lantus and SSI -will follow CBG's and adjust insulin therapy  3-uncontrolled diabetes: presenting on DKA -will continue adjusting insulin and follow CBG's -A1C pending -lantus increase to 18 BID; continue moderate SSI  -low carb diet  4-AKI: pre-renal in nature with DKA; also due to UTI -continue IVF's, but will adjust rate as patient eating and drinking now -Cr trending continue down -will check BMET in am  -continue abx's for UTI; follow cx  5-GERD: continue protonix  6-Depression: continue lexapro  7-recent aortic valve replacement and STEMI -will continue plavix and coumadin per pharmacy -no CP, no abnormalities on tleemtry  8-hx of RA: continue holding methotrexate for now  9-metabolic acidosis: due to recent DKA and AKI -will add bicarb 650 BID continue fluids for renal failure -continue insulin therapy  Code Status: Full Family Communication: no family at bedside Disposition Plan: will transfer out of stepdown   Consultants:  None   Procedures:  See below for x-ray reports   Antibiotics:  Vancomycin and cefepime 2/12>>2/14  Ceftriaxone 2/14  HPI/Subjective: No CP or SOB. Low grade temp overnight. Reports feeling better overall. Sugar still reaching 200's-350's range.  Objective: Filed Vitals:   01/02/15 0744  BP:   Pulse:   Temp: 98.7 F (37.1 C)  Resp: 19    Intake/Output Summary (Last 24 hours) at 01/02/15 0938 Last data filed at 01/02/15 0600  Gross per 24 hour  Intake 3994.6 ml   Output   3000 ml  Net  994.6 ml   Filed Weights   12/31/14 1822 01/01/15 0135  Weight: 77.111 kg (170 lb) 72.8 kg (160 lb 7.9 oz)    Exam:   General:  AAOX3, feeling better and bale to follow commands. Patient sugar better but still reaching mid 200-300 level; also with low grade temp overnight  Cardiovascular: S1 and S2, positive murmur, no rubs or gallops  Respiratory: scattered rhonchi, no wheezing, no crackles and with good O2 sat on RA  Abdomen: soft, NT, ND, positive BS  Musculoskeletal: trace edema bilaterally, no cyanosis   Data Reviewed: Basic Metabolic Panel:  Recent Labs Lab 12/31/14 2256  01/01/15 0426 01/01/15 0730 01/01/15 0830 01/01/15 1019 01/02/15 0350  NA 135  < > 140 145 145 145 137  K 4.7  < > 3.9 3.6 4.0 3.9 4.0  CL 101  < > 111 114* 114* 115* 111  CO2 13*  < > 19 24 22 22  15*  GLUCOSE 950*  < > 462* 152* 153* 131* 301*  BUN 44*  < > 38* 34* 37* 33* 24*  CREATININE 2.38*  < > 1.75* 1.34* 1.37* 1.28* 1.24*  CALCIUM 8.5  < > 8.5 8.3* 8.6 8.4 8.5  MG 2.4  --   --   --   --   --   --   PHOS 5.0*  --   --   --   --   --   --   < > = values in this interval not displayed. Liver Function Tests:  Recent Labs Lab 12/31/14 2256  AST 18  ALT 16  ALKPHOS 84  BILITOT 1.3*  PROT 6.3  ALBUMIN 3.5   CBC:  Recent Labs Lab 12/31/14 1838 01/01/15 0730 01/02/15 0350  WBC 30.6* 23.9* 17.0*  NEUTROABS 26.6* 19.5*  --   HGB 10.3* 8.4* 7.6*  HCT 35.6* 25.0* 23.6*  MCV 92.5 80.4 84.6  PLT 398 286 220   ProBNP (last 3 results)  Recent Labs  09/30/14 1431  PROBNP 794.3*    CBG:  Recent Labs Lab 01/01/15 1607 01/01/15 2114 01/01/15 2338 01/02/15 0231 01/02/15 0743  GLUCAP 115* 415* 484* 319* 232*    Studies: Dg Chest Portable 1 View  12/31/2014   CLINICAL DATA:  Hyperglycemia  EXAM: PORTABLE CHEST - 1 VIEW  COMPARISON:  December 05, 2014  FINDINGS: Lungs are clear. Heart size and pulmonary vascularity are normal. No adenopathy.  Patient is status post aortic valve replacement.  IMPRESSION: No edema or consolidation.   Electronically Signed   By: Bretta Bang III M.D.   On: 12/31/2014 20:11    Scheduled Meds: . aspirin EC  81 mg Oral Daily  . cefTRIAXone (ROCEPHIN)  IV  1 g Intravenous Q24H  . clopidogrel  75 mg Oral Q breakfast  . darifenacin  7.5 mg Oral Daily  . escitalopram  20 mg Oral Daily  . insulin aspart  0-15 Units Subcutaneous TID WC  . insulin glargine  18 Units Subcutaneous QHS  . pantoprazole  40 mg Oral Q1200  . potassium chloride  20 mEq Oral Once  . sodium bicarbonate  650 mg Oral BID  . Warfarin - Pharmacist Dosing Inpatient   Does not apply q1800   Continuous Infusions: . sodium chloride Stopped (01/01/15 1900)  . sodium chloride 100 mL/hr at 01/01/15 2341    Principal Problem:   DKA (diabetic ketoacidoses) Active Problems:   CAD (coronary artery disease)   Rheumatoid arthritis   CVA (cerebral infarction)   Diabetes type 1, uncontrolled   S/P aortic valve replacement   Essential hypertension   Acute kidney injury   Suspected Sepsis    Time spent: 35 minutes (> 50% of time dedicated to care coordination and face to face examination)    Vassie Loll  [facility] Pager [phone]. If 7PM-7AM, please contact night-coverage at [email or web address], password [facility] 01/02/2015, 9:38 AM  LOS: 2 days

## 2015-01-02 NOTE — Progress Notes (Signed)
ANTICOAGULATION CONSULT NOTE - Follow Up Consult  Pharmacy Consult for warfarin Indication: AVR  Allergies  Allergen Reactions  . Celebrex [Celecoxib] Rash  . Detrol [Tolterodine] Hives    Patient Measurements: Height: 5\' 3"  (160 cm) Weight: 160 lb 7.9 oz (72.8 kg) IBW/kg (Calculated) : 52.4  Vital Signs: Temp: 98.7 F (37.1 C) (02/14 0744) Temp Source: Oral (02/14 0744) BP: 103/30 mmHg (02/14 0700)  Labs:  Recent Labs  12/31/14 1838 12/31/14 2256  01/01/15 0500 01/01/15 0730 01/01/15 0830 01/01/15 1019 01/02/15 0350  HGB 10.3*  --   --   --  8.4*  --   --  7.6*  HCT 35.6*  --   --   --  25.0*  --   --  23.6*  PLT 398  --   --   --  286  --   --  220  APTT  --  31  --   --   --   --   --   --   LABPROT  --  22.8*  --  26.1*  --   --   --  44.9*  INR  --  2.00*  --  2.38*  --   --   --  4.74*  CREATININE 2.45* 2.38*  < >  --  1.34* 1.37* 1.28* 1.24*  < > = values in this interval not displayed.  Estimated Creatinine Clearance: 49.6 mL/min (by C-G formula based on Cr of 1.24).   Assessment: 54 y/o female found on floor by sister who has deficits 2/2 prior CVA w/ difficulty ambulating. She was admitted for DKA and possible sepsis. She takes chronic warfarin for AVR. INR is supratherapeutic this morning at 4.74 (up from 2.38 yesterday). No overt bleeding noted, Hb down to 7.6 (10.3 on admit) which could be dilutional, platelets are normal.  PTA: 5 mg/d x 7.5 mg Wed per [facility] clinic (med hx not complete)  Goal of Therapy:  INR 2.5-3.5 Monitor platelets by anticoagulation protocol: Yes   Plan:  - Hold warfarin tonight - INR daily - Monitor for s/sx of bleeding  [facility], [facility].D., BCPS Clinical Pharmacist Pager: [phone] 01/02/2015 9:44 AM

## 2015-01-02 NOTE — Progress Notes (Signed)
Attempted to give report to 5W RN Delphine; floor states 5W RN will call pt back

## 2015-01-02 NOTE — Progress Notes (Signed)
Attempted to call RN for report, she was unable to give report at this time.  My phone number given to the NS.  Will await for return call.  Forbes Cellar, RN

## 2015-01-03 DIAGNOSIS — F332 Major depressive disorder, recurrent severe without psychotic features: Secondary | ICD-10-CM | POA: Diagnosis not present

## 2015-01-03 LAB — BASIC METABOLIC PANEL
Anion gap: 3 — ABNORMAL LOW (ref 5–15)
BUN: 9 mg/dL (ref 6–23)
CHLORIDE: 116 mmol/L — AB (ref 96–112)
CO2: 24 mmol/L (ref 19–32)
Calcium: 8.5 mg/dL (ref 8.4–10.5)
Creatinine, Ser: 0.81 mg/dL (ref 0.50–1.10)
GFR calc Af Amer: >90 mL/min (ref >90)
GFR calc non Af Amer: 81 mL/min — ABNORMAL LOW (ref >90)
GLUCOSE: 111 mg/dL — AB (ref 70–99)
Potassium: 3.8 mmol/L (ref 3.5–5.1)
Sodium: 143 mmol/L (ref 135–145)

## 2015-01-03 LAB — CBC
HCT: 26.6 % — ABNORMAL LOW (ref 36.0–46.0)
Hemoglobin: 8.6 g/dL — ABNORMAL LOW (ref 12.0–15.0)
MCH: 26.6 pg (ref 26.0–34.0)
MCHC: 32.3 g/dL (ref 30.0–36.0)
MCV: 82.4 fL (ref 78.0–100.0)
Platelets: 198 10*3/uL (ref 150–400)
RBC: 3.23 MIL/uL — ABNORMAL LOW (ref 3.87–5.11)
RDW: 16.5 % — AB (ref 11.5–15.5)
WBC: 7.1 10*3/uL (ref 4.0–10.5)

## 2015-01-03 LAB — PROTIME-INR
INR: 7.07 (ref 0.00–1.49)
INR: 6.39 (ref 0.00–1.49)
PROTHROMBIN TIME: 56.7 s — AB (ref 11.6–15.2)
Prothrombin Time: 61.3 seconds — ABNORMAL HIGH (ref 11.6–15.2)

## 2015-01-03 LAB — GLUCOSE, CAPILLARY
GLUCOSE-CAPILLARY: 142 mg/dL — AB (ref 70–99)
Glucose-Capillary: 153 mg/dL — ABNORMAL HIGH (ref 70–99)
Glucose-Capillary: 126 mg/dL — ABNORMAL HIGH (ref 70–99)
Glucose-Capillary: 117 mg/dL — ABNORMAL HIGH (ref 70–99)
Glucose-Capillary: 190 mg/dL — ABNORMAL HIGH (ref 70–99)

## 2015-01-03 LAB — HEMOGLOBIN A1C
Hgb A1c MFr Bld: 9 % — ABNORMAL HIGH (ref 4.8–5.6)
Mean Plasma Glucose: 212 mg/dL

## 2015-01-03 MED ORDER — CEFUROXIME AXETIL 250 MG PO TABS
250.0000 mg | ORAL_TABLET | Freq: Two times a day (BID) | ORAL | Status: DC
  Administered 2015-01-04 – 2015-01-05 (×3): 250 mg via ORAL
  Filled 2015-01-03 (×5): qty 1

## 2015-01-03 MED ORDER — PHYTONADIONE 5 MG PO TABS
2.5000 mg | ORAL_TABLET | Freq: Once | ORAL | Status: AC
Start: 2015-01-03 — End: 2015-01-03
  Administered 2015-01-03: 2.5 mg via ORAL
  Filled 2015-01-03: qty 1

## 2015-01-03 NOTE — Progress Notes (Signed)
[facility] PROGRESS NOTE  Emery Binz PJA:[id number] DOB: 1961/08/11 DOA: 12/31/2014 PCP: Janell Quiet, FNP  Assessment/Plan: 1-sepsis: due to UTI and presumed PNA -patient with recent hospitalization and hx of RA on methotrexate  -Since the patient is tolerating by mouth will switch antibiotic to Ceftin in anticipation for discharge. -Urine culture without any controlled (unfortunately antibiotics initiated prior to collection) -continue IVF's; rate adjusted for maintenance -minimize narcotics and benzos -WBC's within normal limits -Patient denies any dysuria at this moment.  2-DKA: anion gap 3; glucose 111; bicarbonate 24 -off insulin drip, tolerating diet, continue lantus and SSI -will follow CBG's and adjust insulin therapy as needed; with good response to current hypoglycemic regimen   3-Uncontrolled diabetes: presenting with DKA -CBGs Much better with current hypoglycemic regimen.  -will continue adjusting insulin therapy as needed and follow CBG's -A1C 9.0 -Continue modified carbohydrates diet  4-AKI: pre-renal in nature with DKA; also due to UTI -Acute kidney injury is now resolved -IV fluids rate will be adjusted for maintenance -Continue treatment for UTI as mentioned above  -will check BMET in am   5-GERD: continue protonix  6-Depression: continue lexapro  7-recent aortic valve replacement and STEMI -will continue plavix and coumadin per pharmacy (INR goal is 2.5-3.5) -no CP, no abnormalities on telemetry in the last 36 hours; telemetry has been discontinue. -Troponin negative  8-hx of RA: continue holding methotrexate for now; resume at discharge  9-metabolic acidosis: due to recent DKA and AKI -will continue bicarb 650 BID 1 more day  -Renal failure is now resolved; IV fluids rate will be adjusted just for maintenance -continue insulin therapy  10-supratherapeutic INR  -INR in the range of 7  -Will hold Coumadin  -Low dose vitamin K will  be provided (2.5 mg)  -No fresh frozen plasma or complete reversion given the fact that the patient do not have signs of acute bleeding and platelets are within normal limits). -Pharmacy helping with Coumadin level   Code Status: Full Family Communication: no family at bedside Disposition Plan: will transfer out of stepdown   Consultants:  None   Procedures:  See below for x-ray reports   Antibiotics:  Vancomycin and cefepime 2/12>>2/14  Ceftriaxone 2/14>>2/15  Ceftin 2/15 >> (plan is for 3 days of treatment)   HPI/Subjective: No chest pain, no shortness of breath. Afebrile and tolerating diet without any problems. Reports no further dysuria... CBGs under much better control with current hypoglycemic regimen.  Objective: Filed Vitals:   01/03/15 1334  BP: 127/49  Pulse: 70  Temp: 99 F (37.2 C)  Resp: 18    Intake/Output Summary (Last 24 hours) at 01/03/15 1504 Last data filed at 01/03/15 1414  Gross per 24 hour  Intake 1632.5 ml  Output   1100 ml  Net  532.5 ml   Filed Weights   12/31/14 1822 01/01/15 0135  Weight: 77.111 kg (170 lb) 72.8 kg (160 lb 7.9 oz)    Exam:   General:  AAOX3, feeling better and able to follow commands. Patient sugar much better and with CBGs ranging between 140 and 180 now. No fever   Cardiovascular: S1 and S2, positive murmur and click, no rubs or gallops  Respiratory: scattered rhonchi, no wheezing, no crackles and with good O2 sat on RA  Abdomen: soft, NT, ND, positive BS  Musculoskeletal: trace edema bilaterally, no cyanosis   Data Reviewed: Basic Metabolic Panel:  Recent Labs Lab 12/31/14 2256  01/01/15 0730 01/01/15 0830 01/01/15 1019 01/02/15 0350 01/03/15 3419  NA 135  < > 145 145 145 137 143  K 4.7  < > 3.6 4.0 3.9 4.0 3.8  CL 101  < > 114* 114* 115* 111 116*  CO2 13*  < > 24 22 22  15* 24  GLUCOSE 950*  < > 152* 153* 131* 301* 111*  BUN 44*  < > 34* 37* 33* 24* 9  CREATININE 2.38*  < > 1.34* 1.37* 1.28*  1.24* 0.81  CALCIUM 8.5  < > 8.3* 8.6 8.4 8.5 8.5  MG 2.4  --   --   --   --   --   --   PHOS 5.0*  --   --   --   --   --   --   < > = values in this interval not displayed.   Liver Function Tests:  Recent Labs Lab 12/31/14 2256  AST 18  ALT 16  ALKPHOS 84  BILITOT 1.3*  PROT 6.3  ALBUMIN 3.5   CBC:  Recent Labs Lab 12/31/14 1838 01/01/15 0730 01/02/15 0350 01/03/15 0650  WBC 30.6* 23.9* 17.0* 7.1  NEUTROABS 26.6* 19.5*  --   --   HGB 10.3* 8.4* 7.6* 8.6*  HCT 35.6* 25.0* 23.6* 26.6*  MCV 92.5 80.4 84.6 82.4  PLT 398 286 220 198   ProBNP (last 3 results)  Recent Labs  09/30/14 1431  PROBNP 794.3*    CBG:  Recent Labs Lab 01/02/15 1703 01/02/15 2103 01/02/15 2249 01/03/15 0752 01/03/15 1159  GLUCAP 341* 185* 153* 126* 142*    Studies: No results found.  Scheduled Meds: . aspirin EC  81 mg Oral Daily  . cefTRIAXone (ROCEPHIN)  IV  1 g Intravenous Q24H  . clopidogrel  75 mg Oral Q breakfast  . darifenacin  7.5 mg Oral Daily  . escitalopram  20 mg Oral Daily  . insulin aspart  0-15 Units Subcutaneous TID WC  . insulin aspart  3 Units Subcutaneous TID WC  . insulin glargine  18 Units Subcutaneous QHS  . pantoprazole  40 mg Oral Q1200  . sodium bicarbonate  650 mg Oral BID  . Warfarin - Pharmacist Dosing Inpatient   Does not apply q1800   Continuous Infusions: . sodium chloride Stopped (01/01/15 1900)  . sodium chloride 75 mL/hr at 01/03/15 0116    Principal Problem:   DKA (diabetic ketoacidoses) Active Problems:   CAD (coronary artery disease)   Rheumatoid arthritis   CVA (cerebral infarction)   Diabetes type 1, uncontrolled   S/P aortic valve replacement   Essential hypertension   Acute kidney injury   Suspected Sepsis    Time spent: 30 minutes    0117  [facility] Pager [phone]. If 7PM-7AM, please contact night-coverage at [email or web address], password [facility] 01/03/2015, 3:04 PM  LOS: 3 days

## 2015-01-03 NOTE — Progress Notes (Signed)
[facility]  Patient Status: Active (receiving services up to time of hospitalization)  [facility] is providing the following services: RN, PT, OT and MSW  If patient discharges after hours, please call [phone].   Madison [name] 01/03/2015, 11:59 AM

## 2015-01-03 NOTE — Progress Notes (Signed)
PT Cancellation Note  Patient Details Name: Madison [name] MRN: [id number] DOB: 04-28-1961   Cancelled Treatment:    Reason Eval/Treat Not Completed: Medical issues which prohibited therapy, last INR value 6.39, will hold PT today.   [name], [place] 01/03/2015, 12:30 PM

## 2015-01-03 NOTE — Progress Notes (Signed)
ANTICOAGULATION CONSULT NOTE - Follow Up Consult  Pharmacy Consult for warfarin Indication: AVR  Allergies  Allergen Reactions  . Celebrex [Celecoxib] Rash  . Detrol [Tolterodine] Hives    Patient Measurements: Height: 5\' 3"  (160 cm) Weight: 160 lb 7.9 oz (72.8 kg) IBW/kg (Calculated) : 52.4  Vital Signs: Temp: 98.1 F (36.7 C) (02/15 0818) Temp Source: Oral (02/15 0818) BP: 121/44 mmHg (02/15 0818) Pulse Rate: 71 (02/15 0818)  Labs:  Recent Labs  12/31/14 2256  01/01/15 0500 01/01/15 0730  01/01/15 1019 01/02/15 0350 01/03/15 0650  HGB  --   --   --  8.4*  --   --  7.6* 8.6*  HCT  --   --   --  25.0*  --   --  23.6* 26.6*  PLT  --   --   --  286  --   --  220 198  APTT 31  --   --   --   --   --   --   --   LABPROT 22.8*  --  26.1*  --   --   --  44.9* 61.3*  INR 2.00*  --  2.38*  --   --   --  4.74* 7.07*  CREATININE 2.38*  < >  --  1.34*  < > 1.28* 1.24* 0.81  < > = values in this interval not displayed.  Estimated Creatinine Clearance: 76 mL/min (by C-G formula based on Cr of 0.81).   Assessment: 54 y/o female found on floor by sister who has deficits 2/2 prior CVA w/ difficulty ambulating. She was admitted for DKA and possible sepsis. She takes chronic warfarin for AVR. INR is supratherapeutic this morning at 7.07 (rising significantly past 2 days). No overt bleeding noted, Hb down to 7.6>8.6 (10.3 on admit) which could be dilutional, platelets are normal.  PTA: 5 mg/d x 7.5 mg Wed per [facility] clinic   Goal of Therapy:  INR 2.5-3.5   Plan:  - Hold warfarin tonight - INR daily - Monitor for s/sx of bleeding  [facility], Pharm.D. Herby Abraham 01/03/2015 11:01 AM

## 2015-01-03 NOTE — Clinical Documentation Improvement (Addendum)
Please identify any clinical conditions associated with the patient's abnormal lab values below, if any, and document in your progress note and carry over to the discharge summary.    Component      RBC Hemoglobin HCT  Latest Ref Rng      3.87 - 5.11 MIL/uL 12.0 - 15.0 g/dL 63.0 - 16.0 %  11/27/3233     6:38 PM 3.85 (L) 10.3 (L) 35.6 (L)  01/01/2015     7:30 AM 3.11 (L) 8.4 (L) 25.0 (L)  01/02/2015     3:50 AM 2.79 (L) 7.6 (L) 23.6 (L)  01/03/2015      3.23 (L) 8.6 (L) 26.6 (L)   Possible Clinical Conditions: -Anemia (if present, please document acuity and type) -Other condition -Unable to determine at present  Thank you, Doy Mince, RN [phone] Clinical Documentation Specialist  Patient with anemia of chronic disease..  Thanks.Gwenlyn Perking, Mikle Bosworth [phone]

## 2015-01-03 NOTE — Progress Notes (Signed)
Medicare Important Message given?  YES (If response is "NO", the following Medicare IM given date fields will be blank) Date Medicare IM given:  01/03/15 Medicare IM given by:  Yoko Mcgahee 

## 2015-01-03 NOTE — Progress Notes (Signed)
CRITICAL VALUE ALERT  Critical value received:  INR 7.07  Date of notification:  01/03/15  Time of notification:  0840  Critical value read back:Yes.    Nurse who received alert:  Lysbeth Galas, RN  MD notified (1st page):  [place]  Time of first page:  0850  MD notified (2nd page):  Time of second page:  Responding MD:  Gwenlyn Perking  Time MD responded:  [phone] - ordering repeat.

## 2015-01-04 DIAGNOSIS — Z7901 Long term (current) use of anticoagulants: Secondary | ICD-10-CM

## 2015-01-04 LAB — BASIC METABOLIC PANEL
Anion gap: 11 (ref 5–15)
BUN: 6 mg/dL (ref 6–23)
CO2: 20 mmol/L (ref 19–32)
CREATININE: 0.66 mg/dL (ref 0.50–1.10)
Calcium: 8.3 mg/dL — ABNORMAL LOW (ref 8.4–10.5)
Chloride: 112 mmol/L (ref 96–112)
Glucose, Bld: 64 mg/dL — ABNORMAL LOW (ref 70–99)
Potassium: 3.4 mmol/L — ABNORMAL LOW (ref 3.5–5.1)
Sodium: 143 mmol/L (ref 135–145)

## 2015-01-04 LAB — GLUCOSE, CAPILLARY
GLUCOSE-CAPILLARY: 103 mg/dL — AB (ref 70–99)
Glucose-Capillary: 91 mg/dL (ref 70–99)
Glucose-Capillary: 151 mg/dL — ABNORMAL HIGH (ref 70–99)
Glucose-Capillary: 235 mg/dL — ABNORMAL HIGH (ref 70–99)

## 2015-01-04 LAB — PROTIME-INR
INR: 1.71 — ABNORMAL HIGH (ref 0.00–1.49)
Prothrombin Time: 20.2 seconds — ABNORMAL HIGH (ref 11.6–15.2)

## 2015-01-04 MED ORDER — WARFARIN SODIUM 7.5 MG PO TABS
7.5000 mg | ORAL_TABLET | Freq: Once | ORAL | Status: AC
  Administered 2015-01-04: 7.5 mg via ORAL
  Filled 2015-01-04: qty 1

## 2015-01-04 NOTE — Progress Notes (Addendum)
[facility] PROGRESS NOTE  Madison [name] MHD:[id number] DOB: [birth year] DOA: 12/31/2014 PCP: Janell Quiet, FNP  Assessment/Plan: 1-sepsis: due to UTI and presumed PNA -patient with recent hospitalization and hx of RA on methotrexate  -Since the patient is tolerating by mouth will switch antibiotic to Ceftin in anticipation for discharge. -Urine culture without any controlled (unfortunately antibiotics initiated prior to collection) -continue IVF's; rate adjusted for maintenance, ivf stopped on 2/16,  -minimize narcotics and benzos -WBC's within normal limits -Patient denies any dysuria at this moment.  2-DKA: anion gap 3; glucose 111; bicarbonate 24 -off insulin drip, tolerating diet, continue lantus and SSI -will follow CBG's and adjust insulin therapy as needed; with good response to current hypoglycemic regimen   3-Uncontrolled diabetes: presenting with DKA -CBGs Much better with current hypoglycemic regimen.  -will continue adjusting insulin therapy as needed and follow CBG's -A1C 9.0 -Continue modified carbohydrates diet  4-AKI: pre-renal in nature with DKA; also due to UTI -Acute kidney injury is now resolved -IV fluids rate will be adjusted for maintenance -Continue treatment for UTI as mentioned above  -will check BMET in am   5-GERD: continue protonix  6-Depression: continue lexapro  7-recent aortic valve replacement and STEMI -will continue plavix and coumadin per pharmacy (INR goal is 2.5-3.5) -no CP, no abnormalities on telemetry in the last 36 hours; telemetry has been discontinue. -Troponin negative  8-hx of RA: continue holding methotrexate for now; resume at discharge  9-metabolic acidosis: due to recent DKA and AKI -will continue bicarb 650 BID 1 more day  -Renal failure is now resolved; IV fluids rate will be adjusted just for maintenance -continue insulin therapy  10-supratherapeutic INR  -INR in the range of 7  -Will hold Coumadin   -Low dose vitamin K will be provided (2.5 mg)  -No fresh frozen plasma or complete reversion given the fact that the patient do not have signs of acute bleeding and platelets are within normal limits). -Pharmacy helping with Coumadin level  Monitor INR, patient reported has home health RN to help with INR moitoring  11, h/o multiple CVa with left sided weankess, with dysarthria, will check swallow eval  Code Status: Full Family Communication: no family at bedside Disposition Plan: likely e/d home with [facility] on 2/17 if able to take adequate po intake   Consultants:  None   Procedures:  See below for x-ray reports   Antibiotics:  Vancomycin and cefepime 2/12>>2/14  Ceftriaxone 2/14>>2/15  Ceftin 2/15 >> (plan is for 3 days of treatment)   HPI/Subjective: No chest pain, no shortness of breath. Afebrile and tolerating diet without any problems. Reports no further dysuria... CBGs under much better control with current hypoglycemic regimen.  Objective: Filed Vitals:   01/04/15 1315  BP: 132/63  Pulse: 67  Temp: 100 F (37.8 C)  Resp: 20    Intake/Output Summary (Last 24 hours) at 01/04/15 1515 Last data filed at 01/04/15 1300  Gross per 24 hour  Intake    480 ml  Output    500 ml  Net    -20 ml   Filed Weights   12/31/14 1822 01/01/15 0135  Weight: 77.111 kg (170 lb) 72.8 kg (160 lb 7.9 oz)    Exam:   General:  AAOX3, feeling better and able to follow commands. Patient sugar much better and with CBGs ranging between 140 and 180 now. No fever   Cardiovascular: S1 and S2, positive murmur and click, no rubs or gallops  Respiratory: scattered rhonchi, no  wheezing, no crackles and with good O2 sat on RA  Abdomen: soft, NT, ND, positive BS  Musculoskeletal: trace edema bilaterally, no cyanosis   Data Reviewed: Basic Metabolic Panel:  Recent Labs Lab 12/31/14 2256  01/01/15 0830 01/01/15 1019 01/02/15 0350 01/03/15 0650 01/04/15 0700  NA 135  < > 145 145  137 143 143  K 4.7  < > 4.0 3.9 4.0 3.8 3.4*  CL 101  < > 114* 115* 111 116* 112  CO2 13*  < > 22 22 15* 24 20  GLUCOSE 950*  < > 153* 131* 301* 111* 64*  BUN 44*  < > 37* 33* 24* 9 6  CREATININE 2.38*  < > 1.37* 1.28* 1.24* 0.81 0.66  CALCIUM 8.5  < > 8.6 8.4 8.5 8.5 8.3*  MG 2.4  --   --   --   --   --   --   PHOS 5.0*  --   --   --   --   --   --   < > = values in this interval not displayed.   Liver Function Tests:  Recent Labs Lab 12/31/14 2256  AST 18  ALT 16  ALKPHOS 84  BILITOT 1.3*  PROT 6.3  ALBUMIN 3.5   CBC:  Recent Labs Lab 12/31/14 1838 01/01/15 0730 01/02/15 0350 01/03/15 0650  WBC 30.6* 23.9* 17.0* 7.1  NEUTROABS 26.6* 19.5*  --   --   HGB 10.3* 8.4* 7.6* 8.6*  HCT 35.6* 25.0* 23.6* 26.6*  MCV 92.5 80.4 84.6 82.4  PLT 398 286 220 198   ProBNP (last 3 results)  Recent Labs  09/30/14 1431  PROBNP 794.3*    CBG:  Recent Labs Lab 01/03/15 1159 01/03/15 1644 01/03/15 2144 01/04/15 0757 01/04/15 1208  GLUCAP 142* 117* 190* 91 151*    Studies: No results found.  Scheduled Meds: . aspirin EC  81 mg Oral Daily  . cefUROXime  250 mg Oral BID WC  . clopidogrel  75 mg Oral Q breakfast  . darifenacin  7.5 mg Oral Daily  . escitalopram  20 mg Oral Daily  . insulin aspart  0-15 Units Subcutaneous TID WC  . insulin aspart  3 Units Subcutaneous TID WC  . insulin glargine  18 Units Subcutaneous QHS  . pantoprazole  40 mg Oral Q1200  . warfarin  7.5 mg Oral ONCE-1800  . Warfarin - Pharmacist Dosing Inpatient   Does not apply q1800   Continuous Infusions: . sodium chloride Stopped (01/01/15 1900)    Principal Problem:   DKA (diabetic ketoacidoses) Active Problems:   CAD (coronary artery disease)   Rheumatoid arthritis   CVA (cerebral infarction)   Diabetes type 1, uncontrolled   S/P aortic valve replacement   Essential hypertension   Acute kidney injury   Suspected Sepsis    Time spent: 30 minutes    Maurita Havener  [facility] Pager [phone]. If 7PM-7AM, please contact night-coverage at [email or web address], password [facility] 01/04/2015, 3:15 PM  LOS: 4 days

## 2015-01-04 NOTE — Progress Notes (Signed)
Physical Therapy Treatment Patient Details Name: Madison [name] MRN: [id number] DOB: [birth year] Today's Date: 01/04/2015    History of Present Illness      PT Comments    Rx limited to minimal gait in room and transfers due to increased INR.  Follow Up Recommendations  Home health PT     Equipment Recommendations  None recommended by PT    Recommendations for Other Services       Precautions / Restrictions Precautions Precautions: Fall;Other (comment) Precaution Comments: increased INR (6.39)    Mobility  Bed Mobility Overal bed mobility: Modified Independent             General bed mobility comments: Use of rail.  Increased time  Transfers   Equipment used: None   Sit to Stand: Supervision Stand pivot transfers: Supervision       General transfer comment: Supervision for safety. Requires extra time. Did not need physical assist to complete.  Ambulation/Gait Ambulation/Gait assistance: Min guard Ambulation Distance (Feet): 15 Feet Assistive device: None Gait Pattern/deviations: Step-through pattern;Decreased stride length Gait velocity: decreased   General Gait Details:  Hemiparetic gait pattern from prior CVA.  Gait distance limited to in room due to increased INR (6.39) and fall precautions.   Stairs            Wheelchair Mobility    Modified Rankin (Stroke Patients Only)       Balance                                    Cognition Arousal/Alertness: Awake/alert Behavior During Therapy: WFL for tasks assessed/performed Overall Cognitive Status: Within Functional Limits for tasks assessed                      Exercises      General Comments        Pertinent Vitals/Pain Pain Assessment: No/denies pain    Home Living                      Prior Function            PT Goals (current goals can now be found in the care plan section) Progress towards PT goals: Progressing toward goals     Frequency  Min 3X/week    PT Plan Current plan remains appropriate    Co-evaluation             End of Session Equipment Utilized During Treatment: Gait belt Activity Tolerance: Treatment limited secondary to medical complications (Comment) (increased INR) Patient left: in chair;with call bell/phone within reach     Time: 0859-0908 PT Time Calculation (min) (ACUTE ONLY): 9 min  Charges:  $Therapeutic Activity: 8-22 mins                    G Codes:      Ilda Foil 01/04/2015, 10:13 AM

## 2015-01-04 NOTE — Progress Notes (Signed)
ANTICOAGULATION CONSULT NOTE - Follow Up Consult  Pharmacy Consult for warfarin Indication: AVR  Allergies  Allergen Reactions  . Celebrex [Celecoxib] Rash  . Detrol [Tolterodine] Hives    Patient Measurements: Height: 5\' 3"  (160 cm) Weight: 160 lb 7.9 oz (72.8 kg) IBW/kg (Calculated) : 52.4  Vital Signs: Temp: 100 F (37.8 C) (02/16 1315) Temp Source: Oral (02/16 1315) BP: 132/63 mmHg (02/16 1315) Pulse Rate: 67 (02/16 1315)  Labs:  Recent Labs  01/02/15 0350 01/03/15 0650 01/03/15 1015 01/04/15 0700 01/04/15 1135  HGB 7.6* 8.6*  --   --   --   HCT 23.6* 26.6*  --   --   --   PLT 220 198  --   --   --   LABPROT 44.9* 61.3* 56.7*  --  20.2*  INR 4.74* 7.07* 6.39*  --  1.71*  CREATININE 1.24* 0.81  --  0.66  --     Estimated Creatinine Clearance: 76.9 mL/min (by C-G formula based on Cr of 0.66).   Assessment: 54 y/o female found on floor by sister who has deficits 2/2 prior CVA w/ difficulty ambulating. She was admitted for DKA and possible sepsis. She takes chronic warfarin for AVR. INR was supratherapeutic x 2 days (peak INR 7.07)  and vitamin k 2.5 mg po given 2/15 at 1317 per MD.  No overt bleeding noted, Hb  7.6>8.6 (10.3 on admit)l, platelets are normal.  No bleeding noted.  INR is now below goal after giving vitamin K 2/15.  PTA: 5 mg/d x 7.5 mg Wed per [facility]   Goal of Therapy:  INR 2.5-3.5   Plan:  - coumadin 7.5 mg po x 1 dose - INR daily - Monitor for s/sx of bleeding  [facility], Pharm.D. Herby Abraham 01/04/2015 2:37 PM

## 2015-01-04 NOTE — Care Management Note (Signed)
    Page 1 of 2   01/06/2015     4:52:31 PM CARE MANAGEMENT NOTE 01/06/2015  Patient:  Madison [name], Madison [name]   Account Number:  [id number]  Date Initiated:  01/03/2015  Documentation initiated by:  Letha Cape  Subjective/Objective Assessment:   dx dka  admit- lives alone.     Action/Plan:   pt eval-hhpt   Anticipated DC Date:  01/05/2015   Anticipated DC Plan:  HOME W HOME HEALTH SERVICES      DC Planning Services  CM consult      [facility] Choice  Resumption Of Svcs/PTA Provider   Choice offered to / List presented to:          [facility] arranged  HH-1 RN  HH-2 PT  HH-3 OT  HH-6 SOCIAL WORKER      HH agency  [facility]   Status of service:  Completed, signed off Medicare Important Message given?  YES (If response is "NO", the following Medicare IM given date fields will be blank) Date Medicare IM given:  01/03/2015 Medicare IM given by:  Letha Cape Date Additional Medicare IM given:   Additional Medicare IM given by:    Discharge Disposition:  HOME W HOME HEALTH SERVICES  Per UR Regulation:  Reviewed for med. necessity/level of care/duration of stay  If discussed at Long Length of Stay Meetings, dates discussed:    Comments:  12/1814 1650 Letha Cape RN, BSN [phone] patient is active with [facility] fro PT, OT, RN and SW , will resume services, [facility] notified of patient's dc. Patient also active with [facility].  01/04/15 1302 Letha Cape RN ,BSN [phone] patient is for pos dc today, NCM will cont to follow.

## 2015-01-04 NOTE — Progress Notes (Addendum)
Patient active with [facility] Care Management services. Spoke with her at bedside. She reports she is taking her medications as directed but states " I just got sick". She also became teary stating her  54 year old son will have to go live with his father in [place]. States he is hard for her sister to handle anymore. Patient states she plans on going home at discharge. She reports she has been receiving home health thru [facility] for PT/OT/RN services. Noted patient also has social work thru [employer] as well. Patient had recent visit from [facility] Care Coordinator who assisted patient in filling out another Medicaid application and attempted to reach patient's case worker. [facility] Care Coordinator at last home visit was also going over DM education and dietary information. Confirmed patient's best contact information as [phone]. Will make inpatient RNCM aware [facility] is following. Raiford Noble, MSN-RN,BSN- [facility] [phone]

## 2015-01-05 LAB — CBC
HEMATOCRIT: 29.4 % — AB (ref 36.0–46.0)
HEMOGLOBIN: 9.4 g/dL — AB (ref 12.0–15.0)
MCH: 26.9 pg (ref 26.0–34.0)
MCHC: 32 g/dL (ref 30.0–36.0)
MCV: 84.2 fL (ref 78.0–100.0)
Platelets: 172 10*3/uL (ref 150–400)
RBC: 3.49 MIL/uL — ABNORMAL LOW (ref 3.87–5.11)
RDW: 16.5 % — AB (ref 11.5–15.5)
WBC: 4.8 10*3/uL (ref 4.0–10.5)

## 2015-01-05 LAB — BASIC METABOLIC PANEL
Anion gap: 6 (ref 5–15)
BUN: 6 mg/dL (ref 6–23)
CO2: 23 mmol/L (ref 19–32)
Calcium: 8.4 mg/dL (ref 8.4–10.5)
Chloride: 110 mmol/L (ref 96–112)
Creatinine, Ser: 0.7 mg/dL (ref 0.50–1.10)
GFR calc Af Amer: >90 mL/min (ref >90)
Glucose, Bld: 254 mg/dL — ABNORMAL HIGH (ref 70–99)
Potassium: 3.7 mmol/L (ref 3.5–5.1)
SODIUM: 139 mmol/L (ref 135–145)

## 2015-01-05 LAB — GLUCOSE, CAPILLARY
Glucose-Capillary: 234 mg/dL — ABNORMAL HIGH (ref 70–99)
Glucose-Capillary: 147 mg/dL — ABNORMAL HIGH (ref 70–99)

## 2015-01-05 LAB — PROTIME-INR
INR: 1.29 (ref 0.00–1.49)
PROTHROMBIN TIME: 16.2 s — AB (ref 11.6–15.2)

## 2015-01-05 MED ORDER — CEFUROXIME AXETIL 250 MG PO TABS: 250.0000 mg | ORAL_TABLET | Freq: Two times a day (BID) | ORAL | Status: DC

## 2015-01-05 MED ORDER — ZOLPIDEM TARTRATE 10 MG PO TABS: 10.0000 mg | ORAL_TABLET | Freq: Every day | ORAL | Status: DC

## 2015-01-05 NOTE — Evaluation (Signed)
Clinical/Bedside Swallow Evaluation Patient Details  Name: Madison [name] MRN: [id number] Date of Birth: 21-Nov-1960  Today's Date: 01/05/2015 Time: SLP Start Time (ACUTE ONLY): 0903 SLP Stop Time (ACUTE ONLY): 0926 SLP Time Calculation (min) (ACUTE ONLY): 23 min  Past Medical History:  Past Medical History  Diagnosis Date  . CAD (coronary artery disease)   . Obesity   . Hypercholesteremia   . HTN (hypertension)   . Dyslipidemia   . Stroke syndrome   . Aortic stenosis   . Thrombophlebitis   . Diabetes   . Hyperlipidemia   . Rheumatoid arthritis(714.0)    Past Surgical History:  Past Surgical History  Procedure Laterality Date  . Cesarean section    . Foot fracture surgery    . Knee surgery    . Neuroplasty / transposition median nerve at carpal tunnel    . Left heart catheterization with coronary angiogram N/A 12/08/2014    Procedure: LEFT HEART CATHETERIZATION WITH CORONARY ANGIOGRAM;  Surgeon: Iran Ouch, MD;  Location: [facility] CATH LAB;  Service: Cardiovascular;  Laterality: N/A;  . Percutaneous coronary stent intervention (pci-s) N/A 12/09/2014    Procedure: PERCUTANEOUS CORONARY STENT INTERVENTION (PCI-S);  Surgeon: [name], MD;  Location: [facility] CATH LAB;  Service: Cardiovascular;  Laterality: N/A;   HPI:  54 yo female adm to [facility] with AMS- diagnosed with DKA.  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  Swallow evaluation ordered due to pt's dysarthria.      Assessment / Plan / Recommendation Clinical Impression  Pt presents with baseline dysphagia that she reports is consistent with swallow function prior to admission.  She reports occasionally coughing on liquids more than foods but also occasionally senses food residuals in pharynx.   Pt did complain of odynophagia when initially admitted per her statement that she states now has abated.  Observed pt consuming breakfast - no overt clinical indications of  aspiration/penetration with intake.  Adequate mastication noted given pt without upper denture without oral pocketing.  Pt reports xerostomia and therefore recommend she consume liquids t/o the meal.  Recommend continue regular/thin diet as pt appears to be tolerating well and is cautious with intake.  All education completed with pt to mitigate her chronic dysphagia from her previous strokes.  SlP to sign off.      Aspiration Risk  Mild (chronic)    Diet Recommendation Regular;Thin liquid   Liquid Administration via: Cup;Straw Medication Administration: Whole meds with liquid Supervision: Patient able to self feed Compensations: Slow rate;Small sips/bites (start meals with liquids, follow solids with liquids) Postural Changes and/or Swallow Maneuvers: Seated upright 90 degrees;Upright 30-60 min after meal    Other  Recommendations Oral Care Recommendations: Oral care BID   Follow Up Recommendations  None    Frequency and Duration        Pertinent Vitals/Pain Afebrile, decreased    SLP Swallow Goals   NA  Swallow Study Prior Functional Status   pt reports eating regular/thin diet at baseline    General Date of Onset: 01/05/15 HPI: 55 yo female adm to [facility] with AMS- diagnosed with DKA.  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  Swallow evaluation ordered due to pt's dysarthria.    Type of Study: Bedside swallow evaluation Diet Prior to this Study: Regular;Thin liquids Temperature Spikes Noted: No Respiratory Status: Room air History of Recent Intubation: No Behavior/Cognition: Alert;Cooperative;Pleasant mood Oral Cavity -  Dentition: Adequate natural dentition (upper denture not here, pt reports it is at home) Self-Feeding Abilities: Able to feed self;Needs set up Patient Positioning: Upright in bed Baseline Vocal Quality: Clear Volitional Cough: Strong Volitional Swallow: Able to elicit    Oral/Motor/Sensory Function  Overall Oral Motor/Sensory Function:  (h/o lingual deviation slight to left upon protrusion)   Ice Chips Ice chips: Not tested   Thin Liquid Thin Liquid: Impaired Presentation: Straw Pharyngeal  Phase Impairments: Throat Clearing - Immediate Other Comments: via straw, pt reports if she is cautious she does not cough    Nectar Thick Nectar Thick Liquid: Not tested   Honey Thick Honey Thick Liquid: Not tested   Puree Puree: Not tested   Solid   GO    Presentation: Self Fed Other Comments: slow but effective mastication without oral residuals       Donavan Burnet, MS [facility] SLP [phone]

## 2015-01-05 NOTE — Discharge Summary (Signed)
Discharge Summary  Madison [name] JGG:[id number] DOB: [birth year]  PCP: Janell Quiet, FNP  Admit date: 12/31/2014 Discharge date: 01/05/2015  Time spent: >34mins  Recommendations for Outpatient Follow-up:  1. F/u with pmd in 1wk 2. Continue home health, home health RN to check INR in three days, continue close monitor blood sugar  Discharge Diagnoses:  Active Hospital Problems   Diagnosis Date Noted  . DKA (diabetic ketoacidoses) 03/08/2014  . Suspected Sepsis 01/01/2015  . Acute kidney injury   . Essential hypertension   . Diabetes type 1, uncontrolled   . S/P aortic valve replacement   . CVA (cerebral infarction) 03/12/2014  . CAD (coronary artery disease)   . Rheumatoid arthritis     Resolved Hospital Problems   Diagnosis Date Noted Date Resolved  No resolved problems to display.    Discharge Condition: stable, improved  Diet recommendation: heart health/carb modified  Filed Weights   12/31/14 1822 01/01/15 0135  Weight: 77.111 kg (170 lb) 72.8 kg (160 lb 7.9 oz)    History of present illness:  Madison [name] is a 54 y.o. female with Past medical history of diabetes mellitus, coronary artery disease, diastolic heart failure, aortic valve replacement on chronic anticoagulation, CVA, dyslipidemia, rheumatoid arthritis on methotrexate. The patient is presenting with complaints of nausea vomiting associated with high blood sugar. Patient was recently admitted in the hospital for similar presentation with hypotension and hypovolemic shock and was discharged on Plavix Lovenox lisinopril and metoprolol. Patient was found on the floor at home and reportedly she has not been taking her insulin due to nausea and vomiting as she has not been eating. This history was obtained from ED documentation as the patient did not provide this history. Patient reported  has been having nausea and vomiting x2days prior to admission, also has upper abdominal pain. She denies any presence of  pain and now denies any chest pain denies any cough denies any diarrhea or burning urination. EMS also reported as per documentation that the patient has called them many times for episodes of hypoglycemia this week after her recent change in insulin. Patient denies any blood in her vomit or blood in her bowels. Patient denies any black color bowel movement. Patient mentions she is compliant with all medications although she mentions she is still taking Lovenox which should be completed before the end of last month.  She was found to be in sepsis due to uti/pna/DKA/arf/supertherpeutic inr/somnolent on admission   Hospital Course:  Principal Problem:   DKA (diabetic ketoacidoses) Active Problems:   CAD (coronary artery disease)   Rheumatoid arthritis   CVA (cerebral infarction)   Diabetes type 1, uncontrolled   S/P aortic valve replacement   Essential hypertension   Acute kidney injury   Suspected Sepsis  1-sepsis: due to UTI and presumed PNA due to rhonchi on exam -patient was treated with vanc/cefepime initially due to recent hospitalization in 11/2014 and hx of RA on methotrexate  -switched to oral antibiotic to Ceftin on 2/15 with improvement -Urine culture without any controlled (unfortunately antibiotics initiated prior to collection) --WBC's within normal limits -Patient denies any dysuria, no sob, no cough, no v/m, tolerating diet at time of discharge, patient is discharged with three more days of oral abx to finish course.  2-DKA:  -off insulin drip, tolerating diet, continue lantus and SSI -need close monitor with home health/pmd to further adjust insulin dose, a1c improving at 9,    3-Uncontrolled diabetes: presenting with DKA --will continue adjusting insulin therapy  as needed and follow CBG's -A1C 9.0 -Continue modified carbohydrates diet  4-AKI: pre-renal in nature with DKA; also due to UTI -Acute kidney injury is now resolved -received IV fluids  -Continue  treatment for UTI as mentioned above    5-GERD: continue protonix  6-Depression: continue lexapro  7-recent aortic valve replacement and STEMI -will continue plavix and coumadin per pharmacy (INR goal is 2.5-3.5) -no CP, no abnormalities on telemetry in the last 36 hours; telemetry has been discontinue. -Troponin negative  8-hx of RA: continue holding methotrexate in the hospital; resumed at discharge  9-metabolic acidosis: due to recent DKA and AKI -received bicarb 650 BID  -Renal failure is now resolved;  -bmp normalized  10-supratherapeutic INR  -INR in the range of 7 on admission -Coumadin on hold initially -Low dose vitamin K provided (2.5 mg) on admission -No fresh frozen plasma or complete reversion given the fact that the patient do not have signs of acute bleeding and platelets are within normal limits). -Pharmacy helping with Coumadin level  _inr therapeutic at time of discharge, resume home dose coumadin, home health RN to help with INR moitoring  11, h/o multiple CVa with left sided weankess, with dysarthria, passed swallow eval with regular diet/thin liquid. Continue home health/pt  Consultants:  None  Procedures:  See below for x-ray reports  Antibiotics:  Vancomycin and cefepime 2/12>>2/14  Ceftriaxone 2/14>>2/15  Ceftin 2/15   Discharge Exam: BP 133/63 mmHg  Pulse 85  Temp(Src) 98.2 F (36.8 C) (Oral)  Resp 18  Ht 5\' 3"  (1.6 m)  Wt 72.8 kg (160 lb 7.9 oz)  BMI 28.44 kg/m2  SpO2 98%   General: AAOX3, feeling better and able to follow commands. Patient sugar much better and with CBGs ranging between 140 and 180 now. No fever   Cardiovascular: S1 and S2, positive murmur and click, no rubs or gallops  Respiratory: scattered rhonchi resolved, no wheezing, no crackles and with good O2 sat on RA  Abdomen: soft, NT, ND, positive BS  Musculoskeletal: trace edema bilaterally, no cyanosis  Neuro: baseline dysarthria and left hemiparesis  due to prior CVA  Discharge Instructions You were cared for by a hospitalist during your hospital stay. If you have any questions about your discharge medications or the care you received while you were in the hospital after you are discharged, you can call the unit and asked to speak with the hospitalist on call if the hospitalist that took care of you is not available. Once you are discharged, your primary care physician will handle any further medical issues. Please note that NO REFILLS for any discharge medications will be authorized once you are discharged, as it is imperative that you return to your primary care physician (or establish a relationship with a primary care physician if you do not have one) for your aftercare needs so that they can reassess your need for medications and monitor your lab values.  Discharge Instructions    Diet - low sodium heart healthy    Complete by:  As directed      Face-to-face encounter (required for Medicare/Medicaid patients)    Complete by:  As directed   I Rik Wadel certify that this patient is under my care and that I, or a nurse practitioner or physician's assistant working with me, had a face-to-face encounter that meets the physician face-to-face encounter requirements with this patient on 01/05/2015. The encounter with the patient was in whole, or in part for the following medical condition(s) which is  the primary reason for home health care (List medical condition): DKA/cva/chronic anticoagualtion  The encounter with the patient was in whole, or in part, for the following medical condition, which is the primary reason for home health care:  DKA/CVA/chronic anticoagualation  I certify that, based on my findings, the following services are medically necessary home health services:   Nursing Physical therapy    Reason for Medically Necessary Home Health Services:  Skilled Nursing- Change/Decline in Patient Status  My clinical findings support the need for  the above services:  Unable to leave home safely without assistance and/or assistive device  Further, I certify that my clinical findings support that this patient is homebound due to:  Unable to leave home safely without assistance     Home Health    Complete by:  As directed   To provide the following care/treatments:   Home Health Aide Social work [employer] fundraiser PT    RN to check INR in three days, report result to pmd     Increase activity slowly    Complete by:  As directed             Medication List    STOP taking these medications        enoxaparin 80 MG/0.8ML injection  Commonly known as:  LOVENOX      TAKE these medications        aspirin 81 MG tablet  Take 1 tablet (81 mg total) by mouth daily.     baclofen 10 MG tablet  Commonly known as:  LIORESAL  Take 1 tablet (10 mg total) by mouth 2 (two) times daily.     cefUROXime 250 MG tablet  Commonly known as:  CEFTIN  Take 1 tablet (250 mg total) by mouth 2 (two) times daily with a meal.     clopidogrel 75 MG tablet  Commonly known as:  PLAVIX  Take 1 tablet (75 mg total) by mouth daily with breakfast.     escitalopram 20 MG tablet  Commonly known as:  LEXAPRO  Take 20 mg by mouth daily.     feeding supplement (GLUCERNA SHAKE) Liqd  Take 237 mLs by mouth 2 (two) times daily between meals.     glucose blood test strip  Commonly known as:  EMBRACE BLOOD GLUCOSE TEST  Use to check blood sugar twice daily     ONE TOUCH ULTRA TEST test strip  Generic drug:  glucose blood  TEST 5 TIMES A DAY, AND LANCETS 2/DAY 250.03     Insulin Detemir 100 UNIT/ML Pen  Commonly known as:  LEVEMIR FLEXTOUCH  Inject 15 Units into the skin 2 (two) times daily.     insulin lispro 100 UNIT/ML KiwkPen  Commonly known as:  HUMALOG KWIKPEN  Inject 6 units with evening meal     lisinopril 2.5 MG tablet  Commonly known as:  PRINIVIL,ZESTRIL  Take 1 tablet (2.5 mg total) by mouth daily.     methotrexate 2.5 MG tablet  Commonly known  as:  RHEUMATREX  Take 1 tablet (2.5 mg total) by mouth 2 (two) times a week. Caution:Chemotherapy. Protect from light.     metoprolol tartrate 25 MG tablet  Commonly known as:  LOPRESSOR  Take 0.5 tablets (12.5 mg total) by mouth 2 (two) times daily.     omeprazole 40 MG capsule  Commonly known as:  PRILOSEC  TAKE 1 CAPSULE (40 MG TOTAL) BY MOUTH DAILY.     pravastatin 20 MG tablet  Commonly known as:  PRAVACHOL  TAKE 1 TABLET EVERY DAY     promethazine 25 MG tablet  Commonly known as:  PHENERGAN  Take 1 tablet (25 mg total) by mouth every 4 (four) hours as needed for nausea or vomiting.     solifenacin 5 MG tablet  Commonly known as:  VESICARE  Take 0.5 tablets (2.5 mg total) by mouth daily.     traMADol 50 MG tablet  Commonly known as:  ULTRAM  TAKE 1 OR 2 TABLETS BY MOUTH EVERY 8 HOURS AS NEEDED     warfarin 5 MG tablet  Commonly known as:  COUMADIN  Take 1-1.5 tablets (5-7.5 mg total) by mouth daily at 6 PM. Take 5 mg Daily By Mouth on , Tues, Thurs, Fri, Sat & Sun, Take 7.5 mg By Mouth on mondays and Wednesdays .     zolpidem 10 MG tablet  Commonly known as:  AMBIEN  Take 1 tablet (10 mg total) by mouth at bedtime.       Allergies  Allergen Reactions  . Celebrex [Celecoxib] Rash  . Detrol [Tolterodine] Hives       Follow-up Information    Follow up with CAMPBELL, PADONDA BOYD, FNP In 1 week.   Specialty:  Family Medicine   Contact information:   [address] [place] [address] [phone]        The results of significant diagnostics from this hospitalization (including imaging, microbiology, ancillary and laboratory) are listed below for reference.    Significant Diagnostic Studies: Dg Chest Portable 1 View  12/31/2014   CLINICAL DATA:  Hyperglycemia  EXAM: PORTABLE CHEST - 1 VIEW  COMPARISON:  December 05, 2014  FINDINGS: Lungs are clear. Heart size and pulmonary vascularity are normal. No adenopathy. Patient is status post aortic valve  replacement.  IMPRESSION: No edema or consolidation.   Electronically Signed   By: Bretta Bang III M.D.   On: 12/31/2014 20:11    Microbiology: Recent Results (from the past 240 hour(s))  Urine culture     Status: None   Collection Time: 12/31/14  9:19 PM  Result Value Ref Range Status   Specimen Description URINE, CLEAN CATCH  Final   Special Requests NONE  Final   Colony Count NO GROWTH Performed at [facility]   Final   Culture NO GROWTH Performed at [facility]   Final   Report Status 01/02/2015 FINAL  Final  Culture, blood (routine x 2)     Status: None (Preliminary result)   Collection Time: 12/31/14 10:56 PM  Result Value Ref Range Status   Specimen Description BLOOD RIGHT ARM  Final   Special Requests BOTTLES DRAWN AEROBIC AND ANAEROBIC 10CC  Final   Culture   Final           BLOOD CULTURE RECEIVED NO GROWTH TO DATE CULTURE WILL BE HELD FOR 5 DAYS BEFORE ISSUING A FINAL NEGATIVE REPORT Performed at [facility]    Report Status PENDING  Incomplete  Culture, blood (routine x 2)     Status: None (Preliminary result)   Collection Time: 12/31/14 11:05 PM  Result Value Ref Range Status   Specimen Description BLOOD RIGHT FOREARM  Final   Special Requests BOTTLES DRAWN AEROBIC AND ANAEROBIC 5CC  Final   Culture   Final           BLOOD CULTURE RECEIVED NO GROWTH TO DATE CULTURE WILL BE HELD FOR 5 DAYS BEFORE ISSUING A FINAL NEGATIVE REPORT Performed at [facility]    Report Status PENDING  Incomplete     Labs: Basic Metabolic Panel:  Recent Labs Lab 12/31/14 2256  01/01/15 1019 01/02/15 0350 01/03/15 0650 01/04/15 0700 01/05/15 0747  NA 135  < > 145 137 143 143 139  K 4.7  < > 3.9 4.0 3.8 3.4* 3.7  CL 101  < > 115* 111 116* 112 110  CO2 13*  < > 22 15* 24 20 23   GLUCOSE 950*  < > 131* 301* 111* 64* 254*  BUN 44*  < > 33* 24* 9 6 6   CREATININE 2.38*  < > 1.28* 1.24* 0.81 0.66 0.70  CALCIUM 8.5  < > 8.4 8.5 8.5 8.3* 8.4    MG 2.4  --   --   --   --   --   --   PHOS 5.0*  --   --   --   --   --   --   < > = values in this interval not displayed. Liver Function Tests:  Recent Labs Lab 12/31/14 2256  AST 18  ALT 16  ALKPHOS 84  BILITOT 1.3*  PROT 6.3  ALBUMIN 3.5   No results for input(s): LIPASE, AMYLASE in the last 168 hours. No results for input(s): AMMONIA in the last 168 hours. CBC:  Recent Labs Lab 12/31/14 1838 01/01/15 0730 01/02/15 0350 01/03/15 0650 01/05/15 0747  WBC 30.6* 23.9* 17.0* 7.1 4.8  NEUTROABS 26.6* 19.5*  --   --   --   HGB 10.3* 8.4* 7.6* 8.6* 9.4*  HCT 35.6* 25.0* 23.6* 26.6* 29.4*  MCV 92.5 80.4 84.6 82.4 84.2  PLT 398 286 220 198 172   Cardiac Enzymes: No results for input(s): CKTOTAL, CKMB, CKMBINDEX, TROPONINI in the last 168 hours. BNP: BNP (last 3 results) No results for input(s): BNP in the last 8760 hours.  ProBNP (last 3 results)  Recent Labs  09/30/14 1431  PROBNP 794.3*    CBG:  Recent Labs Lab 01/04/15 0757 01/04/15 1208 01/04/15 1650 01/04/15 2120 01/05/15 0754  GLUCAP 91 151* 103* 235* 234*       Signed:  Luan Maberry  [facility] 01/05/2015, 11:53 AM

## 2015-01-05 NOTE — Progress Notes (Signed)
Inpatient Diabetes Program Recommendations  AACE/ADA: New Consensus Statement on Inpatient Glycemic Control (2013)  Target Ranges:  Prepandial:   less than 140 mg/dL      Peak postprandial:   less than 180 mg/dL (1-2 hours)      Critically ill patients:  140 - 180 mg/dL   Results for VONNA, BRABSON (MRN [id number]) as of 01/05/2015 10:50  Ref. Range 01/04/2015 07:57 01/04/2015 12:08 01/04/2015 16:50 01/04/2015 21:20 01/05/2015 07:54  Glucose-Capillary Latest Range: 70-99 mg/dL 91 202 (H) 542 (H) 706 (H) 234 (H)   Current orders for Inpatient glycemic control: Lantus 18 units QHS, Novolog 0-15 units TID with meals, Novolog 3 TID with meals for meal coverage  Inpatient Diabetes Program Recommendations Correction (SSI): Bedtime glucose was 235 mg/dl last night but no Novolog correction ordered. Please consider ordering Novolog bedtime correction scale. Insulin - Meal Coverage: Please consider increasing meal coverage to 5 units TID with meals.  Note: In reviewing the chart, noted pt did not receive any basal insulin on 2/15. Lantus 18 units was received last night as ordered.   Thanks, Orlando Penner, RN, MSN, CCRN, CDE Diabetes Coordinator Inpatient Diabetes Program [phone] (Team Pager) [phone] ([facility] office) [phone] [facility] office)

## 2015-01-06 DIAGNOSIS — Z7901 Long term (current) use of anticoagulants: Secondary | ICD-10-CM | POA: Diagnosis not present

## 2015-01-06 DIAGNOSIS — M069 Rheumatoid arthritis, unspecified: Secondary | ICD-10-CM | POA: Diagnosis not present

## 2015-01-06 DIAGNOSIS — B3781 Candidal esophagitis: Secondary | ICD-10-CM | POA: Diagnosis not present

## 2015-01-06 DIAGNOSIS — F0390 Unspecified dementia without behavioral disturbance: Secondary | ICD-10-CM | POA: Diagnosis not present

## 2015-01-06 DIAGNOSIS — E1065 Type 1 diabetes mellitus with hyperglycemia: Secondary | ICD-10-CM | POA: Diagnosis not present

## 2015-01-06 DIAGNOSIS — M6281 Muscle weakness (generalized): Secondary | ICD-10-CM | POA: Diagnosis not present

## 2015-01-06 DIAGNOSIS — W19XXXD Unspecified fall, subsequent encounter: Secondary | ICD-10-CM | POA: Diagnosis not present

## 2015-01-07 DIAGNOSIS — F0390 Unspecified dementia without behavioral disturbance: Secondary | ICD-10-CM | POA: Diagnosis not present

## 2015-01-07 DIAGNOSIS — M069 Rheumatoid arthritis, unspecified: Secondary | ICD-10-CM | POA: Diagnosis not present

## 2015-01-07 DIAGNOSIS — M6281 Muscle weakness (generalized): Secondary | ICD-10-CM | POA: Diagnosis not present

## 2015-01-07 DIAGNOSIS — E1065 Type 1 diabetes mellitus with hyperglycemia: Secondary | ICD-10-CM | POA: Diagnosis not present

## 2015-01-07 DIAGNOSIS — B3781 Candidal esophagitis: Secondary | ICD-10-CM | POA: Diagnosis not present

## 2015-01-07 DIAGNOSIS — Z7901 Long term (current) use of anticoagulants: Secondary | ICD-10-CM | POA: Diagnosis not present

## 2015-01-07 DIAGNOSIS — W19XXXD Unspecified fall, subsequent encounter: Secondary | ICD-10-CM | POA: Diagnosis not present

## 2015-01-07 LAB — CULTURE, BLOOD (ROUTINE X 2)
CULTURE: NO GROWTH
CULTURE: NO GROWTH

## 2015-01-10 ENCOUNTER — Ambulatory Visit: Payer: Commercial Managed Care - HMO | Admitting: Physical Medicine & Rehabilitation

## 2015-01-10 ENCOUNTER — Telehealth: Payer: Self-pay | Admitting: Endocrinology

## 2015-01-10 ENCOUNTER — Encounter: Payer: Commercial Managed Care - HMO | Attending: Physical Medicine & Rehabilitation

## 2015-01-10 DIAGNOSIS — E161 Other hypoglycemia: Secondary | ICD-10-CM | POA: Diagnosis not present

## 2015-01-10 DIAGNOSIS — G8110 Spastic hemiplegia affecting unspecified side: Secondary | ICD-10-CM | POA: Insufficient documentation

## 2015-01-10 LAB — GLUCOSE, CAPILLARY
GLUCOSE-CAPILLARY: 118 mg/dL — AB (ref 70–99)
GLUCOSE-CAPILLARY: 113 mg/dL — AB (ref 70–99)

## 2015-01-10 MED ORDER — GLUCOSE BLOOD VI STRP: ORAL_STRIP | Status: DC

## 2015-01-10 MED ORDER — ACCU-CHEK AVIVA DEVI: Status: DC

## 2015-01-10 NOTE — Telephone Encounter (Signed)
Patient need test strips for Accu Check meter sent to [facility] on [address] and [address]

## 2015-01-10 NOTE — Telephone Encounter (Signed)
Rx sent to pharmacy   

## 2015-01-11 DIAGNOSIS — M6281 Muscle weakness (generalized): Secondary | ICD-10-CM | POA: Diagnosis not present

## 2015-01-11 DIAGNOSIS — B3781 Candidal esophagitis: Secondary | ICD-10-CM | POA: Diagnosis not present

## 2015-01-11 DIAGNOSIS — W19XXXD Unspecified fall, subsequent encounter: Secondary | ICD-10-CM | POA: Diagnosis not present

## 2015-01-11 DIAGNOSIS — F0390 Unspecified dementia without behavioral disturbance: Secondary | ICD-10-CM | POA: Diagnosis not present

## 2015-01-11 DIAGNOSIS — M069 Rheumatoid arthritis, unspecified: Secondary | ICD-10-CM | POA: Diagnosis not present

## 2015-01-11 DIAGNOSIS — E1065 Type 1 diabetes mellitus with hyperglycemia: Secondary | ICD-10-CM | POA: Diagnosis not present

## 2015-01-11 DIAGNOSIS — Z7901 Long term (current) use of anticoagulants: Secondary | ICD-10-CM | POA: Diagnosis not present

## 2015-01-12 ENCOUNTER — Telehealth: Payer: Self-pay | Admitting: Family

## 2015-01-12 DIAGNOSIS — M6281 Muscle weakness (generalized): Secondary | ICD-10-CM | POA: Diagnosis not present

## 2015-01-12 DIAGNOSIS — Z7901 Long term (current) use of anticoagulants: Secondary | ICD-10-CM | POA: Diagnosis not present

## 2015-01-12 DIAGNOSIS — W19XXXD Unspecified fall, subsequent encounter: Secondary | ICD-10-CM | POA: Diagnosis not present

## 2015-01-12 DIAGNOSIS — B3781 Candidal esophagitis: Secondary | ICD-10-CM | POA: Diagnosis not present

## 2015-01-12 DIAGNOSIS — E1065 Type 1 diabetes mellitus with hyperglycemia: Secondary | ICD-10-CM | POA: Diagnosis not present

## 2015-01-12 DIAGNOSIS — M069 Rheumatoid arthritis, unspecified: Secondary | ICD-10-CM | POA: Diagnosis not present

## 2015-01-12 DIAGNOSIS — F0390 Unspecified dementia without behavioral disturbance: Secondary | ICD-10-CM | POA: Diagnosis not present

## 2015-01-12 NOTE — Progress Notes (Signed)
erron

## 2015-01-12 NOTE — Telephone Encounter (Signed)
Pt has an appt tomorrow with dr Caryl Never. Pt did not have her protime drawn this weekend due to she was unavailable.  Caitlin rn from [employer] [employer] said  pt has 1cm x 1.7 cm dark spot on left heel. Pt has been having pain

## 2015-01-13 ENCOUNTER — Telehealth: Payer: Self-pay | Admitting: General Practice

## 2015-01-13 ENCOUNTER — Encounter: Payer: Self-pay | Admitting: Family Medicine

## 2015-01-13 ENCOUNTER — Encounter: Payer: Medicare HMO | Admitting: Cardiovascular Disease

## 2015-01-13 ENCOUNTER — Ambulatory Visit (INDEPENDENT_AMBULATORY_CARE_PROVIDER_SITE_OTHER): Payer: Commercial Managed Care - HMO | Admitting: General Practice

## 2015-01-13 ENCOUNTER — Ambulatory Visit (INDEPENDENT_AMBULATORY_CARE_PROVIDER_SITE_OTHER): Payer: Commercial Managed Care - HMO | Admitting: Family Medicine

## 2015-01-13 VITALS — BP 100/64 | HR 64 | Temp 98.5°F | Wt 161.0 lb

## 2015-01-13 DIAGNOSIS — Z954 Presence of other heart-valve replacement: Secondary | ICD-10-CM

## 2015-01-13 DIAGNOSIS — E1049 Type 1 diabetes mellitus with other diabetic neurological complication: Secondary | ICD-10-CM

## 2015-01-13 DIAGNOSIS — Z7901 Long term (current) use of anticoagulants: Secondary | ICD-10-CM | POA: Diagnosis not present

## 2015-01-13 DIAGNOSIS — Z952 Presence of prosthetic heart valve: Secondary | ICD-10-CM

## 2015-01-13 DIAGNOSIS — S9032XA Contusion of left foot, initial encounter: Secondary | ICD-10-CM | POA: Diagnosis not present

## 2015-01-13 DIAGNOSIS — B3781 Candidal esophagitis: Secondary | ICD-10-CM | POA: Diagnosis not present

## 2015-01-13 DIAGNOSIS — M6281 Muscle weakness (generalized): Secondary | ICD-10-CM | POA: Diagnosis not present

## 2015-01-13 DIAGNOSIS — M069 Rheumatoid arthritis, unspecified: Secondary | ICD-10-CM | POA: Diagnosis not present

## 2015-01-13 DIAGNOSIS — E1065 Type 1 diabetes mellitus with hyperglycemia: Secondary | ICD-10-CM | POA: Diagnosis not present

## 2015-01-13 DIAGNOSIS — W19XXXD Unspecified fall, subsequent encounter: Secondary | ICD-10-CM | POA: Diagnosis not present

## 2015-01-13 DIAGNOSIS — F0390 Unspecified dementia without behavioral disturbance: Secondary | ICD-10-CM | POA: Diagnosis not present

## 2015-01-13 LAB — POCT INR: INR: 1.6

## 2015-01-13 NOTE — Progress Notes (Signed)
Subjective:    Patient ID: Madison [name], female    DOB: 1961-03-04, 54 y.o.   MRN: [id number]  HPI Patient is in the process of transferring to another primary care and endocrinologist. I'm seeing her for hospital follow-up. She has multiple chronic problems including type 1 diabetes, history of aortic valve replacement, rheumatoid arthritis, CAD, GERD hyperlipidemia, hypertension, and history of CVA with mild left hemiparesis and mild dysarthria. She was admitted on [address] 01/05/2015 following diabetic ketoacidosis with suspected sepsis. She was treated for possible UTI though cultures were negative. Also possible pneumonia. She was initially on broad-spectrum metabolic vancomycin and cefepime and then transition to Ceftin. Her white count on admission was 30,000 in this promptly came down to 4.8 thousand after IV antibiotics. She denies any cough or dysuria at this time.  She states she has no baseline now. She apparently has very brittle diabetes has had multiple hypoglycemic episodes during the past year. She states her blood sugar this morning was over 200. She is followed by endocrinology. She is on regimen of Levemir 25 units twice daily and Humalog 6 units with supper. She states most of her hypoglycemia occurs during the morning hours.  She has history of aortic valve replacement is on chronic Coumadin. INR today 1.6.  Home health noted small bruise left posterior heel. Denies injury. Minimally tender.  Past Medical History  Diagnosis Date  . CAD (coronary artery disease)   . Obesity   . Hypercholesteremia   . HTN (hypertension)   . Dyslipidemia   . Stroke syndrome   . Aortic stenosis   . Thrombophlebitis   . Diabetes   . Hyperlipidemia   . Rheumatoid arthritis(714.0)    Past Surgical History  Procedure Laterality Date  . Cesarean section    . Foot fracture surgery    . Knee surgery    . Neuroplasty / transposition median nerve at carpal tunnel    . Left heart  catheterization with coronary angiogram N/A 12/08/2014    Procedure: LEFT HEART CATHETERIZATION WITH CORONARY ANGIOGRAM;  Surgeon: Iran Ouch, MD;  Location: [facility] CATH LAB;  Service: Cardiovascular;  Laterality: N/A;  . Percutaneous coronary stent intervention (pci-s) N/A 12/09/2014    Procedure: PERCUTANEOUS CORONARY STENT INTERVENTION (PCI-S);  Surgeon: [name], MD;  Location: [facility] CATH LAB;  Service: Cardiovascular;  Laterality: N/A;    reports that she has never smoked. She has never used smokeless tobacco. She reports that she does not drink alcohol or use illicit drugs. family history includes Heart disease in her mother and sister; Stroke in an other family member. Allergies  Allergen Reactions  . Celebrex [Celecoxib] Rash  . Detrol [Tolterodine] Hives      Review of Systems  Constitutional: Negative for fever and chills.  HENT: Negative for sore throat.   Respiratory: Negative for cough and shortness of breath.   Cardiovascular: Negative for chest pain.  Gastrointestinal: Negative for abdominal pain.  Genitourinary: Negative for dysuria.       Objective:   Physical Exam  Constitutional: She appears well-developed and well-nourished. No distress.  Neck: Neck supple.  Cardiovascular: Normal rate and regular rhythm.   Pulmonary/Chest: Effort normal and breath sounds normal. No respiratory distress. She has no wheezes. She has no rales.  Musculoskeletal: She exhibits no edema.  Lymphadenopathy:    She has no cervical adenopathy.  Skin:  Left posterior heel reveals small area of ecchymosis. There is no callus and no skin breakdown. No ulceration.  Assessment & Plan:  #1 recent diabetic ketoacidosis presumably secondary to infection and possible sepsis. Clinically stable at this time. She is afebrile. She is instructed to follow-up immediately for any fever, cough, dysuria #2 brittle diabetes. She is in process of establishing with new  endocrinologist. #3 history of aortic valve replacement chronic Coumadin. Subtherapeutic today. Adjustments were made and she has follow-up with home health for repeat INR in 1 week. #4 small area of ecchymosis left posterior heel. No skin breakdown. She has generally dry skin. Try some Vaseline topically to keep this from drying out and to avoid friction on this region

## 2015-01-13 NOTE — Progress Notes (Signed)
Pre visit review using our clinic review tool, if applicable. No additional management support is needed unless otherwise documented below in the visit note. 

## 2015-01-13 NOTE — Patient Instructions (Signed)
Diabetic Ketoacidosis Diabetic ketoacidosis (DKA) is a life-threatening complication of type 1 diabetes. It must be quickly recognized and treated. Treatment requires hospitalization. CAUSES  When there is no insulin in the body, glucose (sugar) cannot be used, and the body breaks down fat for energy. When fat breaks down, acids (ketones) build up in the blood. Very high levels of glucose and high levels of acids lead to severe loss of body fluids (dehydration) and other dangerous chemical changes. This stresses your vital organs and can cause coma or death. SIGNS AND SYMPTOMS   Tiredness (fatigue).  Weight loss.  Excessive thirst.  Ketones in your urine.  Light-headedness.  Fruity or sweet smelling breath.  Excessive urination.  Visual changes.  Confusion or irritability.  Nausea or vomiting.  Rapid breathing.  Stomachache or abdominal pain. DIAGNOSIS  Your health care provider will diagnose DKA based on your history, physical exam, and blood tests. The health care provider will check to see if you have another illness that caused you to go into DKA. Most of this will be done quickly in an emergency room. TREATMENT   Fluid replacement to correct dehydration.  Insulin.  Correction of electrolytes, such as potassium and sodium.  Antibiotic medicines. PREVENTION  Always take your insulin. Do not skip your insulin injections.  If you are sick, treat yourself quickly. Your body often needs more insulin to fight the illness.  Check your blood glucose regularly.  Check urine ketones if your blood glucose is greater than 240 milligrams per deciliter (mg/dL).  Do not use outdated (expired) insulin.  If your blood glucose is high, drink plenty of fluids. This helps flush out ketones. HOME CARE INSTRUCTIONS   If you are sick, follow the advice of your health care provider.  To prevent dehydration, drink enough water and fluids to keep your urine clear or pale  yellow.  If you cannot eat, alternate between drinking fluids with sugar (soda, juices, flavored gelatin) and salty fluids (broth, bouillon).  If you can eat, follow your usual diet and drink sugar-free liquids (water, diet drinks).  Always take your usual dose of insulin. If you cannot eat or if your glucose is getting too low, call your health care provider for further instructions.  Continue to monitor your blood or urine ketones every 3-4 hours around the clock. Set your alarm clock or have someone wake you up. If you are too sick, have someone test it for you.  Rest and avoid exercise. SEEK MEDICAL CARE IF:   You have a fever.  You have ketones in your urine, or your blood glucose is higher than a level your health care provider suggests. You may need extra insulin. Call your health care provider if you need advice on adjusting your insulin.  You cannot drink at least a tablespoon (15 mL) of fluid every 15-20 minutes.  You have been vomiting for more than 2 hours.  You have symptoms of DKA:  Fruity smelling breath.  Breathing faster or slower.  Becoming very sleepy. SEEK IMMEDIATE MEDICAL CARE IF:   You have signs of dehydration:  Decreased urination.  Increased thirst.  Dry skin and mouth.  Light-headedness.  Your blood glucose is very high (as advised by your health care provider) twice in a row.  You faint.  You have chest pain or trouble breathing.  You have a sudden, severe headache.  You have sudden weakness in one arm or one leg.  You have sudden trouble speaking or swallowing.  You have vomiting or diarrhea that is getting worse after 3 hours.  You have abdominal pain. MAKE SURE YOU:   Understand these instructions.  Will watch your condition.  Will get help right away if you are not doing well or get worse. Document Released: 11/02/2000 Document Revised: 11/10/2013 Document Reviewed: 05/11/2009 [facility] Patient Information 2015 [place],  [place]. This information is not intended to replace advice given to you by your health care provider. Make sure you discuss any questions you have with your health care provider.  Try some vaseline to skin left posterior heel to help avoid drying and friction to area.

## 2015-01-13 NOTE — Telephone Encounter (Signed)
Orders faxed to [facility] to check INR on 01/18/15 and to call results to  Bailey Mech, RN @ [phone].

## 2015-01-14 DIAGNOSIS — W19XXXD Unspecified fall, subsequent encounter: Secondary | ICD-10-CM | POA: Diagnosis not present

## 2015-01-14 DIAGNOSIS — B3781 Candidal esophagitis: Secondary | ICD-10-CM | POA: Diagnosis not present

## 2015-01-14 DIAGNOSIS — E1065 Type 1 diabetes mellitus with hyperglycemia: Secondary | ICD-10-CM | POA: Diagnosis not present

## 2015-01-14 DIAGNOSIS — Z7901 Long term (current) use of anticoagulants: Secondary | ICD-10-CM | POA: Diagnosis not present

## 2015-01-14 DIAGNOSIS — M6281 Muscle weakness (generalized): Secondary | ICD-10-CM | POA: Diagnosis not present

## 2015-01-14 DIAGNOSIS — F0390 Unspecified dementia without behavioral disturbance: Secondary | ICD-10-CM | POA: Diagnosis not present

## 2015-01-14 DIAGNOSIS — M069 Rheumatoid arthritis, unspecified: Secondary | ICD-10-CM | POA: Diagnosis not present

## 2015-01-17 ENCOUNTER — Inpatient Hospital Stay (HOSPITAL_COMMUNITY)
Admission: EM | Admit: 2015-01-17 | Discharge: 2015-01-24 | DRG: 871 | Disposition: A | Discharge status: Skilled Nursing Facility | Payer: Commercial Managed Care - HMO | Attending: Internal Medicine | Admitting: Internal Medicine

## 2015-01-17 ENCOUNTER — Emergency Department (HOSPITAL_COMMUNITY): Payer: Commercial Managed Care - HMO

## 2015-01-17 ENCOUNTER — Encounter (HOSPITAL_COMMUNITY): Payer: Self-pay | Admitting: *Deleted

## 2015-01-17 ENCOUNTER — Inpatient Hospital Stay (HOSPITAL_COMMUNITY): Payer: Commercial Managed Care - HMO

## 2015-01-17 DIAGNOSIS — D649 Anemia, unspecified: Secondary | ICD-10-CM | POA: Diagnosis present

## 2015-01-17 DIAGNOSIS — A419 Sepsis, unspecified organism: Secondary | ICD-10-CM | POA: Diagnosis not present

## 2015-01-17 DIAGNOSIS — R401 Stupor: Secondary | ICD-10-CM | POA: Diagnosis not present

## 2015-01-17 DIAGNOSIS — W1839XA Other fall on same level, initial encounter: Secondary | ICD-10-CM | POA: Diagnosis present

## 2015-01-17 DIAGNOSIS — Z954 Presence of other heart-valve replacement: Secondary | ICD-10-CM | POA: Diagnosis not present

## 2015-01-17 DIAGNOSIS — R509 Fever, unspecified: Secondary | ICD-10-CM

## 2015-01-17 DIAGNOSIS — I10 Essential (primary) hypertension: Secondary | ICD-10-CM | POA: Diagnosis not present

## 2015-01-17 DIAGNOSIS — E1010 Type 1 diabetes mellitus with ketoacidosis without coma: Secondary | ICD-10-CM

## 2015-01-17 DIAGNOSIS — E780 Pure hypercholesterolemia: Secondary | ICD-10-CM | POA: Diagnosis present

## 2015-01-17 DIAGNOSIS — Z7902 Long term (current) use of antithrombotics/antiplatelets: Secondary | ICD-10-CM | POA: Diagnosis not present

## 2015-01-17 DIAGNOSIS — R739 Hyperglycemia, unspecified: Secondary | ICD-10-CM | POA: Diagnosis not present

## 2015-01-17 DIAGNOSIS — R4182 Altered mental status, unspecified: Secondary | ICD-10-CM | POA: Diagnosis not present

## 2015-01-17 DIAGNOSIS — S299XXA Unspecified injury of thorax, initial encounter: Secondary | ICD-10-CM | POA: Diagnosis not present

## 2015-01-17 DIAGNOSIS — N179 Acute kidney failure, unspecified: Secondary | ICD-10-CM | POA: Diagnosis not present

## 2015-01-17 DIAGNOSIS — I2510 Atherosclerotic heart disease of native coronary artery without angina pectoris: Secondary | ICD-10-CM | POA: Diagnosis present

## 2015-01-17 DIAGNOSIS — Z6830 Body mass index (BMI) 30.0-30.9, adult: Secondary | ICD-10-CM

## 2015-01-17 DIAGNOSIS — E11649 Type 2 diabetes mellitus with hypoglycemia without coma: Secondary | ICD-10-CM | POA: Diagnosis not present

## 2015-01-17 DIAGNOSIS — Z952 Presence of prosthetic heart valve: Secondary | ICD-10-CM | POA: Diagnosis not present

## 2015-01-17 DIAGNOSIS — E0810 Diabetes mellitus due to underlying condition with ketoacidosis without coma: Secondary | ICD-10-CM | POA: Diagnosis not present

## 2015-01-17 DIAGNOSIS — Z7901 Long term (current) use of anticoagulants: Secondary | ICD-10-CM | POA: Diagnosis not present

## 2015-01-17 DIAGNOSIS — M069 Rheumatoid arthritis, unspecified: Secondary | ICD-10-CM | POA: Diagnosis present

## 2015-01-17 DIAGNOSIS — Z7982 Long term (current) use of aspirin: Secondary | ICD-10-CM | POA: Diagnosis not present

## 2015-01-17 DIAGNOSIS — E872 Acidosis: Secondary | ICD-10-CM | POA: Diagnosis present

## 2015-01-17 DIAGNOSIS — G9340 Encephalopathy, unspecified: Secondary | ICD-10-CM | POA: Diagnosis not present

## 2015-01-17 DIAGNOSIS — E1311 Other specified diabetes mellitus with ketoacidosis with coma: Secondary | ICD-10-CM | POA: Insufficient documentation

## 2015-01-17 DIAGNOSIS — E1169 Type 2 diabetes mellitus with other specified complication: Secondary | ICD-10-CM | POA: Diagnosis not present

## 2015-01-17 DIAGNOSIS — E860 Dehydration: Secondary | ICD-10-CM | POA: Diagnosis not present

## 2015-01-17 DIAGNOSIS — K219 Gastro-esophageal reflux disease without esophagitis: Secondary | ICD-10-CM | POA: Diagnosis present

## 2015-01-17 DIAGNOSIS — R7309 Other abnormal glucose: Secondary | ICD-10-CM | POA: Diagnosis not present

## 2015-01-17 DIAGNOSIS — N170 Acute kidney failure with tubular necrosis: Secondary | ICD-10-CM | POA: Diagnosis not present

## 2015-01-17 DIAGNOSIS — I350 Nonrheumatic aortic (valve) stenosis: Secondary | ICD-10-CM | POA: Diagnosis present

## 2015-01-17 DIAGNOSIS — G9341 Metabolic encephalopathy: Secondary | ICD-10-CM | POA: Diagnosis present

## 2015-01-17 DIAGNOSIS — Z8249 Family history of ischemic heart disease and other diseases of the circulatory system: Secondary | ICD-10-CM | POA: Diagnosis not present

## 2015-01-17 DIAGNOSIS — E876 Hypokalemia: Secondary | ICD-10-CM | POA: Diagnosis present

## 2015-01-17 DIAGNOSIS — Z955 Presence of coronary angioplasty implant and graft: Secondary | ICD-10-CM | POA: Diagnosis not present

## 2015-01-17 DIAGNOSIS — I959 Hypotension, unspecified: Secondary | ICD-10-CM | POA: Diagnosis not present

## 2015-01-17 DIAGNOSIS — E785 Hyperlipidemia, unspecified: Secondary | ICD-10-CM | POA: Diagnosis not present

## 2015-01-17 DIAGNOSIS — D508 Other iron deficiency anemias: Secondary | ICD-10-CM | POA: Diagnosis not present

## 2015-01-17 DIAGNOSIS — E0811 Diabetes mellitus due to underlying condition with ketoacidosis with coma: Secondary | ICD-10-CM | POA: Diagnosis not present

## 2015-01-17 DIAGNOSIS — Z794 Long term (current) use of insulin: Secondary | ICD-10-CM | POA: Diagnosis not present

## 2015-01-17 DIAGNOSIS — E1110 Type 2 diabetes mellitus with ketoacidosis without coma: Secondary | ICD-10-CM | POA: Diagnosis present

## 2015-01-17 DIAGNOSIS — R791 Abnormal coagulation profile: Secondary | ICD-10-CM | POA: Diagnosis present

## 2015-01-17 DIAGNOSIS — Z8673 Personal history of transient ischemic attack (TIA), and cerebral infarction without residual deficits: Secondary | ICD-10-CM | POA: Diagnosis not present

## 2015-01-17 DIAGNOSIS — R6521 Severe sepsis with septic shock: Secondary | ICD-10-CM | POA: Diagnosis not present

## 2015-01-17 DIAGNOSIS — E669 Obesity, unspecified: Secondary | ICD-10-CM | POA: Diagnosis present

## 2015-01-17 DIAGNOSIS — E1310 Other specified diabetes mellitus with ketoacidosis without coma: Secondary | ICD-10-CM | POA: Diagnosis present

## 2015-01-17 HISTORY — DX: Depression, unspecified: F32.A

## 2015-01-17 HISTORY — DX: Cardiac murmur, unspecified: R01.1

## 2015-01-17 HISTORY — DX: Anxiety disorder, unspecified: F41.9

## 2015-01-17 HISTORY — DX: Acute myocardial infarction, unspecified: I21.9

## 2015-01-17 HISTORY — DX: Gastro-esophageal reflux disease without esophagitis: K21.9

## 2015-01-17 HISTORY — DX: Pneumonia, unspecified organism: J18.9

## 2015-01-17 HISTORY — DX: Major depressive disorder, single episode, unspecified: F32.9

## 2015-01-17 LAB — COMPREHENSIVE METABOLIC PANEL
ALK PHOS: 75 U/L (ref 39–117)
ALT: 17 U/L (ref 0–35)
AST: 19 U/L (ref 0–37)
Albumin: 2.7 g/dL — ABNORMAL LOW (ref 3.5–5.2)
Anion gap: 25 — ABNORMAL HIGH (ref 5–15)
BUN: 41 mg/dL — AB (ref 6–23)
CHLORIDE: 105 mmol/L (ref 96–112)
CO2: 6 mmol/L — AB (ref 19–32)
Calcium: 7.2 mg/dL — ABNORMAL LOW (ref 8.4–10.5)
Creatinine, Ser: 2.58 mg/dL — ABNORMAL HIGH (ref 0.50–1.10)
GFR, EST AFRICAN AMERICAN: 23 mL/min — AB (ref >90)
GFR, EST NON AFRICAN AMERICAN: 20 mL/min — AB (ref >90)
GLUCOSE: 948 mg/dL — AB (ref 70–99)
POTASSIUM: 4.3 mmol/L (ref 3.5–5.1)
SODIUM: 136 mmol/L (ref 135–145)
TOTAL PROTEIN: 5.2 g/dL — AB (ref 6.0–8.3)
Total Bilirubin: 1.7 mg/dL — ABNORMAL HIGH (ref 0.3–1.2)

## 2015-01-17 LAB — RAPID URINE DRUG SCREEN, HOSP PERFORMED
Amphetamines: NOT DETECTED
Barbiturates: NOT DETECTED
Benzodiazepines: NOT DETECTED
Cocaine: NOT DETECTED
Opiates: NOT DETECTED
Tetrahydrocannabinol: NOT DETECTED

## 2015-01-17 LAB — I-STAT VENOUS BLOOD GAS, ED
ACID-BASE DEFICIT: 22 mmol/L — AB (ref 0.0–2.0)
Bicarbonate: 6 mEq/L — ABNORMAL LOW (ref 20.0–24.0)
O2 SAT: 65 %
TCO2: 7 mmol/L (ref 0–100)
pCO2, Ven: 18.9 mmHg — ABNORMAL LOW (ref 45.0–50.0)
pH, Ven: 7.107 — CL (ref 7.250–7.300)
pO2, Ven: 44 mmHg (ref 30.0–45.0)

## 2015-01-17 LAB — URINALYSIS, ROUTINE W REFLEX MICROSCOPIC
BILIRUBIN URINE: NEGATIVE
LEUKOCYTES UA: NEGATIVE
Nitrite: NEGATIVE
PROTEIN: NEGATIVE mg/dL
Specific Gravity, Urine: 1.025 (ref 1.005–1.030)
Urobilinogen, UA: 0.2 mg/dL (ref 0.0–1.0)
pH: 5 (ref 5.0–8.0)

## 2015-01-17 LAB — BASIC METABOLIC PANEL
Anion gap: 17 — ABNORMAL HIGH (ref 5–15)
BUN: 37 mg/dL — ABNORMAL HIGH (ref 6–23)
CALCIUM: 7.4 mg/dL — AB (ref 8.4–10.5)
CHLORIDE: 113 mmol/L — AB (ref 96–112)
CO2: 12 mmol/L — ABNORMAL LOW (ref 19–32)
Creatinine, Ser: 2.24 mg/dL — ABNORMAL HIGH (ref 0.50–1.10)
GFR calc Af Amer: 27 mL/min — ABNORMAL LOW (ref >90)
GFR calc non Af Amer: 24 mL/min — ABNORMAL LOW (ref >90)
GLUCOSE: 611 mg/dL — AB (ref 70–99)
Potassium: 3.4 mmol/L — ABNORMAL LOW (ref 3.5–5.1)
Sodium: 142 mmol/L (ref 135–145)

## 2015-01-17 LAB — CBC WITH DIFFERENTIAL/PLATELET
BASOS PCT: 0 % (ref 0–1)
Basophils Absolute: 0 10*3/uL (ref 0.0–0.1)
EOS ABS: 0 10*3/uL (ref 0.0–0.7)
Eosinophils Relative: 0 % (ref 0–5)
HCT: 33.2 % — ABNORMAL LOW (ref 36.0–46.0)
HEMOGLOBIN: 9.3 g/dL — AB (ref 12.0–15.0)
LYMPHS PCT: 10 % — AB (ref 12–46)
Lymphs Abs: 2.8 10*3/uL (ref 0.7–4.0)
MCH: 26.1 pg (ref 26.0–34.0)
MCHC: 28 g/dL — AB (ref 30.0–36.0)
MCV: 93 fL (ref 78.0–100.0)
MONO ABS: 1.1 10*3/uL — AB (ref 0.1–1.0)
Monocytes Relative: 4 % (ref 3–12)
NEUTROS PCT: 86 % — AB (ref 43–77)
Neutro Abs: 23.6 10*3/uL — ABNORMAL HIGH (ref 1.7–7.7)
Platelets: 425 10*3/uL — ABNORMAL HIGH (ref 150–400)
RBC: 3.57 MIL/uL — ABNORMAL LOW (ref 3.87–5.11)
RDW: 18.9 % — AB (ref 11.5–15.5)
WBC: 27.5 10*3/uL — AB (ref 4.0–10.5)

## 2015-01-17 LAB — CBG MONITORING, ED: Glucose-Capillary: >600 mg/dL (ref 70–99)

## 2015-01-17 LAB — URINE MICROSCOPIC-ADD ON

## 2015-01-17 LAB — CBC
HCT: 25.1 % — ABNORMAL LOW (ref 36.0–46.0)
Hemoglobin: 7.6 g/dL — ABNORMAL LOW (ref 12.0–15.0)
MCH: 26.2 pg (ref 26.0–34.0)
MCHC: 30.3 g/dL (ref 30.0–36.0)
MCV: 86.6 fL (ref 78.0–100.0)
PLATELETS: 392 10*3/uL (ref 150–400)
RBC: 2.9 MIL/uL — ABNORMAL LOW (ref 3.87–5.11)
RDW: 18.1 % — AB (ref 11.5–15.5)
WBC: 25.9 10*3/uL — ABNORMAL HIGH (ref 4.0–10.5)

## 2015-01-17 LAB — GLUCOSE, CAPILLARY
Glucose-Capillary: >600 mg/dL (ref 70–99)
Glucose-Capillary: >600 mg/dL (ref 70–99)
Glucose-Capillary: >600 mg/dL (ref 70–99)
Glucose-Capillary: 526 mg/dL — ABNORMAL HIGH (ref 70–99)

## 2015-01-17 LAB — I-STAT CG4 LACTIC ACID, ED: Lactic Acid, Venous: 11.15 mmol/L (ref 0.5–2.0)

## 2015-01-17 LAB — PROTIME-INR
INR: 4.36 — AB (ref 0.00–1.49)
INR: 4.28 — ABNORMAL HIGH (ref 0.00–1.49)
PROTHROMBIN TIME: 41.4 s — AB (ref 11.6–15.2)
Prothrombin Time: 42 seconds — ABNORMAL HIGH (ref 11.6–15.2)

## 2015-01-17 LAB — ACETAMINOPHEN LEVEL

## 2015-01-17 LAB — TROPONIN I
Troponin I: 0.04 ng/mL — ABNORMAL HIGH (ref <0.031)
Troponin I: 0.06 ng/mL — ABNORMAL HIGH (ref <0.031)

## 2015-01-17 LAB — TYPE AND SCREEN
ABO/RH(D): A POS
ANTIBODY SCREEN: NEGATIVE

## 2015-01-17 LAB — APTT: aPTT: 34 seconds (ref 24–37)

## 2015-01-17 LAB — LACTIC ACID, PLASMA
Lactic Acid, Venous: 6.1 mmol/L (ref 0.5–2.0)
Lactic Acid, Venous: 3.1 mmol/L (ref 0.5–2.0)

## 2015-01-17 LAB — SALICYLATE LEVEL

## 2015-01-17 LAB — FIBRINOGEN: Fibrinogen: 322 mg/dL (ref 204–475)

## 2015-01-17 LAB — MRSA PCR SCREENING: MRSA by PCR: NEGATIVE

## 2015-01-17 LAB — PROCALCITONIN: Procalcitonin: 3.86 ng/mL

## 2015-01-17 LAB — CARBOXYHEMOGLOBIN
Carboxyhemoglobin: 1.1 % (ref 0.5–1.5)
Methemoglobin: 0.9 % (ref 0.0–1.5)
O2 Saturation: 71.7 %
TOTAL HEMOGLOBIN: 8.1 g/dL — AB (ref 12.0–16.0)

## 2015-01-17 LAB — ETHANOL: Alcohol, Ethyl (B): <5 mg/dL (ref 0–9)

## 2015-01-17 LAB — I-STAT TROPONIN, ED: TROPONIN I, POC: 0.02 ng/mL (ref 0.00–0.08)

## 2015-01-17 MED ORDER — PIPERACILLIN-TAZOBACTAM 3.375 G IVPB
3.3750 g | Freq: Three times a day (TID) | INTRAVENOUS | Status: DC
  Administered 2015-01-18 (×2): 3.375 g via INTRAVENOUS
  Filled 2015-01-17 (×3): qty 50

## 2015-01-17 MED ORDER — SODIUM CHLORIDE 0.9 % IV BOLUS (SEPSIS)
500.0000 mL | INTRAVENOUS | Status: DC | PRN
  Administered 2015-01-17 (×2): 999 mL via INTRAVENOUS

## 2015-01-17 MED ORDER — PRAVASTATIN SODIUM 20 MG PO TABS
20.0000 mg | ORAL_TABLET | Freq: Every day | ORAL | Status: DC
Start: 2015-01-17 — End: 2015-01-24
  Administered 2015-01-19 – 2015-01-24 (×6): 20 mg via ORAL
  Filled 2015-01-17 (×7): qty 1

## 2015-01-17 MED ORDER — CHLORHEXIDINE GLUCONATE 0.12 % MT SOLN
15.0000 mL | Freq: Two times a day (BID) | OROMUCOSAL | Status: DC
  Administered 2015-01-17 – 2015-01-23 (×10): 15 mL via OROMUCOSAL
  Filled 2015-01-17 (×15): qty 15

## 2015-01-17 MED ORDER — CETYLPYRIDINIUM CHLORIDE 0.05 % MT LIQD
7.0000 mL | Freq: Two times a day (BID) | OROMUCOSAL | Status: DC
  Administered 2015-01-18 – 2015-01-23 (×7): 7 mL via OROMUCOSAL

## 2015-01-17 MED ORDER — ASPIRIN 81 MG PO CHEW
81.0000 mg | CHEWABLE_TABLET | Freq: Every day | ORAL | Status: DC
  Administered 2015-01-19 – 2015-01-24 (×6): 81 mg via ORAL
  Filled 2015-01-17 (×7): qty 1

## 2015-01-17 MED ORDER — SODIUM CHLORIDE 0.9 % IV SOLN
INTRAVENOUS | Status: DC
  Administered 2015-01-17: 5.4 [IU]/h via INTRAVENOUS
  Filled 2015-01-17: qty 2.5

## 2015-01-17 MED ORDER — VANCOMYCIN HCL IN DEXTROSE 1-5 GM/200ML-% IV SOLN
1000.0000 mg | Freq: Two times a day (BID) | INTRAVENOUS | Status: DC
  Administered 2015-01-18 – 2015-01-19 (×3): 1000 mg via INTRAVENOUS
  Filled 2015-01-17 (×4): qty 200

## 2015-01-17 MED ORDER — SODIUM CHLORIDE 0.9 % IV BOLUS (SEPSIS)
1000.0000 mL | Freq: Once | INTRAVENOUS | Status: AC
  Administered 2015-01-17: 1000 mL via INTRAVENOUS

## 2015-01-17 MED ORDER — SODIUM CHLORIDE 0.45 % IV SOLN
INTRAVENOUS | Status: DC
  Administered 2015-01-17: 23:00:00 via INTRAVENOUS

## 2015-01-17 MED ORDER — NOREPINEPHRINE BITARTRATE 1 MG/ML IV SOLN
0.0000 ug/min | INTRAVENOUS | Status: DC
  Administered 2015-01-17: 5 ug/min via INTRAVENOUS
  Filled 2015-01-17: qty 16

## 2015-01-17 MED ORDER — VANCOMYCIN HCL IN DEXTROSE 1-5 GM/200ML-% IV SOLN: 1000.0000 mg | Freq: Once | INTRAVENOUS | Status: DC

## 2015-01-17 MED ORDER — SODIUM CHLORIDE 0.9 % IV BOLUS (SEPSIS): 500.0000 mL | INTRAVENOUS | Status: DC

## 2015-01-17 MED ORDER — SODIUM CHLORIDE 0.9 % IV SOLN
INTRAVENOUS | Status: DC
  Administered 2015-01-17: 19:00:00 via INTRAVENOUS

## 2015-01-17 MED ORDER — CLOPIDOGREL BISULFATE 75 MG PO TABS
75.0000 mg | ORAL_TABLET | Freq: Every day | ORAL | Status: DC
  Administered 2015-01-19 – 2015-01-24 (×6): 75 mg via ORAL
  Filled 2015-01-17 (×11): qty 1

## 2015-01-17 MED ORDER — SODIUM CHLORIDE 0.9 % IV BOLUS (SEPSIS)
1000.0000 mL | INTRAVENOUS | Status: DC
  Administered 2015-01-17: 1000 mL via INTRAVENOUS

## 2015-01-17 MED ORDER — SODIUM CHLORIDE 0.9 % IV SOLN: INTRAVENOUS | Status: DC

## 2015-01-17 MED ORDER — POTASSIUM CHLORIDE 10 MEQ/50ML IV SOLN
10.0000 meq | INTRAVENOUS | Status: AC
  Administered 2015-01-17 – 2015-01-18 (×4): 10 meq via INTRAVENOUS
  Filled 2015-01-17: qty 50

## 2015-01-17 MED ORDER — SODIUM CHLORIDE 0.9 % IV BOLUS (SEPSIS)
1000.0000 mL | INTRAVENOUS | Status: AC
  Administered 2015-01-17 (×2): 1000 mL via INTRAVENOUS

## 2015-01-17 MED ORDER — DEXTROSE-NACL 5-0.45 % IV SOLN
INTRAVENOUS | Status: DC
  Administered 2015-01-18 – 2015-01-19 (×4): via INTRAVENOUS

## 2015-01-17 MED ORDER — SODIUM CHLORIDE 0.9 % IV SOLN
INTRAVENOUS | Status: DC
  Administered 2015-01-17: 10.8 [IU]/h via INTRAVENOUS
  Filled 2015-01-17: qty 2.5

## 2015-01-17 MED ORDER — POTASSIUM CHLORIDE 10 MEQ/100ML IV SOLN: 10.0000 meq | INTRAVENOUS | Status: DC

## 2015-01-17 MED ORDER — PHENYLEPHRINE HCL 10 MG/ML IJ SOLN
20.0000 ug/min | INTRAVENOUS | Status: DC
  Administered 2015-01-17: 40 ug/min via INTRAVENOUS
  Filled 2015-01-17: qty 1

## 2015-01-17 MED ORDER — PIPERACILLIN-TAZOBACTAM 3.375 G IVPB 30 MIN
3.3750 g | Freq: Once | INTRAVENOUS | Status: AC
Start: 2015-01-17 — End: 2015-01-17
  Administered 2015-01-17: 3.375 g via INTRAVENOUS
  Filled 2015-01-17: qty 50

## 2015-01-17 MED ORDER — VANCOMYCIN HCL 10 G IV SOLR
1500.0000 mg | Freq: Once | INTRAVENOUS | Status: AC
  Administered 2015-01-17: 1500 mg via INTRAVENOUS
  Filled 2015-01-17: qty 1500

## 2015-01-17 NOTE — ED Notes (Signed)
Madison [name], [place] Rn made aware of need for repositioning of [address] prior to use per Bebe Shaggy, MD, during central line insertion the pts  Step mother called & was requesting to be updated on the pts care, pt did not respond to question of whether the step mother could be updated, step mother provided [phone] as a contact number, attempted to call step mother back & advise that we could not give any info over the phone, the number had a busy signal x 3 attempts, Madison Dike, RN made aware

## 2015-01-17 NOTE — Progress Notes (Signed)
CRITICAL VALUE ALERT  Critical value received:  co2 6, gluc 948, la 6.1  Date of notification:  01/17/15  Time of notification:  2004  Critical value read back:Yes.    Nurse who received alert:  km  MD notified (1st page):  elink/feinstein  Time MD responded:  [phone]

## 2015-01-17 NOTE — Progress Notes (Signed)
eLink Physician-Brief Progress Note Patient Name: Madison [name] DOB: 29-Mar-1961 MRN: [id number]   Date of Service  01/17/2015  HPI/Events of Note  k low, cl rising  eICU Interventions  supp k  Change to 1/2 NS     Intervention Category Intermediate Interventions: Electrolyte abnormality - evaluation and management  [name]. 01/17/2015, 10:33 PM

## 2015-01-17 NOTE — H&P (Signed)
PULMONARY / CRITICAL CARE MEDICINE   Name: Madison [name] MRN: [id number] DOB: 04-16-1961    ADMISSION DATE:  01/17/2015 CONSULTATION DATE:  01/17/2015  REFERRING MD :  EDP  CHIEF COMPLAINT:  AMS  INITIAL PRESENTATION:  54 y.o. F brought to [facility] ED 2/29 with AMS.  In ED, found to have severe DKA.  PCCM called for admission..     STUDIES:  CT head 2/29 >>> chronic b/l infarcts, atrophy.  No acute changes. CXR 2/29 >>> no acute process, R IJ CVL in right atrium  SIGNIFICANT EVENTS: 2/29 - admit   HISTORY OF PRESENT ILLNESS:  Pt is encephalopathic; therefore, this HPI is obtained from chart review. Madison [name] is a 54 y.o. F who was brought to [facility] ED 2/29 via EMS for AMS.  Pt was last seen normal 1 day prior.  On day of presentation, PT went for home visit and was unable to get anyone to answer the door bell.  EMS was dispatched and when door was opened, they found pt face up, lying on floor, only responsive to pain. In ED, pt remained unresponsive and was found to be in severe DKA.  In addition, she remained hypotensive despite 3L IVF's. CVL was placed by EDP and pt was started on insulin gtt as well as empiric abx.  PCCM called for admission.  Of note, she was recently admitted 2/22 - 2/17 for DKA, renal failure, supertherapeutic INR (has mechanical heart valve).  PAST MEDICAL HISTORY :   has a past medical history of CAD (coronary artery disease); Obesity; Hypercholesteremia; HTN (hypertension); Dyslipidemia; Stroke syndrome; Aortic stenosis; Thrombophlebitis; Diabetes; Hyperlipidemia; and Rheumatoid arthritis(714.0).  has past surgical history that includes Cesarean section; Foot fracture surgery; Knee surgery; Neuroplasty / transposition median nerve at carpal tunnel; left heart catheterization with coronary angiogram (N/A, 12/08/2014); and percutaneous coronary stent intervention (pci-s) (N/A, 12/09/2014). Prior to Admission medications   Medication Sig Start Date End Date Taking?  Authorizing Provider  aspirin 81 MG tablet Take 1 tablet (81 mg total) by mouth daily. 10/11/14   Huey Bienenstock, MD  baclofen (LIORESAL) 10 MG tablet Take 1 tablet (10 mg total) by mouth 2 (two) times daily. 10/29/14   Baker Pierini, FNP  Blood Glucose Monitoring Suppl (ACCU-CHEK AVIVA) device Use as instructed 01/10/15 01/10/16  Romero Belling, MD  clopidogrel (PLAVIX) 75 MG tablet Take 1 tablet (75 mg total) by mouth daily with breakfast. 12/15/14   Nishant Dhungel, MD  escitalopram (LEXAPRO) 20 MG tablet Take 20 mg by mouth daily.    Historical Provider, MD  feeding supplement, GLUCERNA SHAKE, (GLUCERNA SHAKE) LIQD Take 237 mLs by mouth 2 (two) times daily between meals. 12/15/14   Nishant Dhungel, MD  glucose blood (ACCU-CHEK AVIVA) test strip Use to check blood sugar 5 times per day Dx Code E10.10 And lancets 5/day 01/10/15   Romero Belling, MD  Insulin Detemir (LEVEMIR FLEXTOUCH) 100 UNIT/ML Pen Inject 15 Units into the skin 2 (two) times daily. Patient taking differently: Inject 25 Units into the skin 2 (two) times daily.  12/15/14   Nishant Dhungel, MD  insulin lispro (HUMALOG KWIKPEN) 100 UNIT/ML KiwkPen Inject 6 units with evening meal 12/15/14   Nishant Dhungel, MD  lisinopril (PRINIVIL,ZESTRIL) 2.5 MG tablet Take 1 tablet (2.5 mg total) by mouth daily. 12/15/14   Nishant Dhungel, MD  methotrexate (RHEUMATREX) 2.5 MG tablet Take 1 tablet (2.5 mg total) by mouth 2 (two) times a week. Caution:Chemotherapy. Protect from light. 10/12/14   Padonda  Karren Cobble, FNP  metoprolol tartrate (LOPRESSOR) 25 MG tablet Take 0.5 tablets (12.5 mg total) by mouth 2 (two) times daily. 12/15/14   Nishant Dhungel, MD  omeprazole (PRILOSEC) 40 MG capsule TAKE 1 CAPSULE (40 MG TOTAL) BY MOUTH DAILY. 11/05/14   Nelwyn Salisbury, MD  pravastatin (PRAVACHOL) 20 MG tablet TAKE 1 TABLET EVERY DAY 06/01/14   Baker Pierini, FNP  promethazine (PHENERGAN) 25 MG tablet Take 1 tablet (25 mg total) by mouth every 4 (four) hours  as needed for nausea or vomiting. 10/08/14   Nelwyn Salisbury, MD  solifenacin (VESICARE) 5 MG tablet Take 0.5 tablets (2.5 mg total) by mouth daily. 12/29/14   Baker Pierini, FNP  traMADol (ULTRAM) 50 MG tablet TAKE 1 OR 2 TABLETS BY MOUTH EVERY 8 HOURS AS NEEDED 08/04/14   Baker Pierini, FNP  warfarin (COUMADIN) 5 MG tablet Take 1-1.5 tablets (5-7.5 mg total) by mouth daily at 6 PM. Take 5 mg Daily By Mouth on , Tues, Thurs, Fri, Sat & Sun, Take 7.5 mg By Mouth on mondays and Wednesdays . Patient taking differently: Take 5-7.5 mg by mouth daily at 6 PM. Take 5 mg Daily By Mouth on Mon, Tues, Thurs, Fri, Sat & Sun, Take 7.5 mg By Mouth on Wednesdays . 12/15/14   Nishant Dhungel, MD  zolpidem (AMBIEN) 10 MG tablet Take 1 tablet (10 mg total) by mouth at bedtime. 01/05/15   Albertine Grates, MD   Allergies  Allergen Reactions  . Celebrex [Celecoxib] Rash  . Detrol [Tolterodine] Hives    FAMILY HISTORY:  Family History  Problem Relation Age of Onset  . Stroke    . Heart disease Mother   . Heart disease Sister     SOCIAL HISTORY:  reports that she has never smoked. She has never used smokeless tobacco. She reports that she does not drink alcohol or use illicit drugs.  REVIEW OF SYSTEMS:  Unable to obtain as pt is encephalopathic.  SUBJECTIVE:   VITAL SIGNS: Temp:  [96.1 F (35.6 C)] 96.1 F (35.6 C) (02/29 1534) Pulse Rate:  [104-154] 154 (02/29 1715) Resp:  [22-27] 24 (02/29 1715) BP: (66-80)/(26-51) 79/33 mmHg (02/29 1715) SpO2:  [96 %] 96 % (02/29 1715) HEMODYNAMICS:   VENTILATOR SETTINGS:   INTAKE / OUTPUT: Intake/Output    None     PHYSICAL EXAMINATION: General: WDWN female, moaning on ED stretcher. Neuro: Awake but not responsive, does not follow commands or answer questions.  MAE's though LUL somewhat spastic with ? Digital contractures. HEENT: [place]/AT. PERRL, sclerae anicteric. Cardiovascular: RRR, mechanical S2 Lungs: Kussmaul resp pattern. CTA bilaterally, No  W/R/R. Abdomen: BS x 4, soft, NT/ND.  Ext: No gross deformities, no edema.  Skin: Dry, intact, warm, no rashes.  LABS:  CBC  Recent Labs Lab 01/17/15 1502  WBC 27.5*  HGB 9.3*  HCT 33.2*  PLT 425*   Coag's  Recent Labs Lab 01/13/15 01/17/15 1545  INR 1.6 4.36*   BMET No results for input(s): NA, K, CL, CO2, BUN, CREATININE, GLUCOSE in the last 168 hours. Electrolytes No results for input(s): CALCIUM, MG, PHOS in the last 168 hours. Sepsis Markers  Recent Labs Lab 01/17/15 1545  LATICACIDVEN 11.15*   ABG No results for input(s): PHART, PCO2ART, PO2ART in the last 168 hours. Liver Enzymes No results for input(s): AST, ALT, ALKPHOS, BILITOT, ALBUMIN in the last 168 hours. Cardiac Enzymes No results for input(s): TROPONINI, PROBNP in the last 168 hours. Glucose  Recent Labs  Lab 01/17/15 1504  GLUCAP >600*    Imaging Ct Head Wo Contrast  01/17/2015   CLINICAL DATA:  Found down on floor. Acute altered mental status. Hyperglycemia.  EXAM: CT HEAD WITHOUT CONTRAST  TECHNIQUE: Contiguous axial images were obtained from the base of the skull through the vertex without contrast.  COMPARISON:  12/03/2014  FINDINGS: Exam is limited with motion artifact. Brain atrophy evident with extensive periventricular and subcortical white matter microvascular changes throughout both cerebral hemispheres.  Extensive encephalomalacia in the right posterior frontal, anterior frontal, parietal, and temporal lobes from remote infarcts. Bilateral occipital and left posterior frontal encephalomalacia also evident. No acute intracranial hemorrhage, definite new infarction, midline shift, herniation, hydrocephalus, or extra-axial fluid collection. Cisterns patent. Remote left cerebellar infarct. Mastoids and sinuses clear. No gross skull abnormality.  IMPRESSION: Limited with motion artifact. Grossly stable exam with chronic bilateral infarcts, atrophy, and extensive white matter microvascular  ischemic changes.   Electronically Signed   By: Judie Petit.  Shick M.D.   On: 01/17/2015 16:36   Dg Chest Portable 1 View  01/17/2015   CLINICAL DATA:  Altered mental status, fall, hyperglycemia  EXAM: PORTABLE CHEST - 1 VIEW  COMPARISON:  01/17/2015  FINDINGS: Cardiomediastinal silhouette is stable. Status post median sternotomy. There is right IJ central line with tip in right atrium. For distal SVC position the central line should be retracted about 4.7 cm. No pneumothorax.  IMPRESSION: No active disease.  Right IJ central line with tip in right atrium.   Electronically Signed   By: Natasha Mead M.D.   On: 01/17/2015 17:22   Dg Chest Portable 1 View  01/17/2015   CLINICAL DATA:  Altered mental status, fall, hyperglycemia  EXAM: PORTABLE CHEST - 1 VIEW  COMPARISON:  12/31/2014  FINDINGS: Borderline cardiomegaly. Status post median sternotomy. Again noted status post aortic valve replacement. No acute infiltrate or pleural effusion. No pulmonary edema.  IMPRESSION: Borderline cardiomegaly.  No active disease.   Electronically Signed   By: Natasha Mead M.D.   On: 01/17/2015 15:40    ASSESSMENT / PLAN:  ENDOCRINE A:   Severe DKA DM II with hx of non-compliance   P:   Insulin and fluids per DKA protocol. Stress importance of complying with outpatient DM regimen and outpatient follow up.  CARDIOVASCULAR CVL R IJ 2/29 >>> A:  Shock, likely multifactorial - hyopvolemic, concern for septic, r/o cardiogenic Hx CAD s/p PCI 12/09/14, HLD, HTN, AS s/p valve replacement P:  Goal MAP > 65. Goal CVP 8 - 12. NS boluses PRN Norepi to maintain MAP > 65 mmHg Trend lactate. Cycle cardiac markers Resume outpatient plavix, coumadin per pharmacy, pravastatin when able to take PO meds Holding outpatient metoprolol, lisinopril.  PULMONARY A: Kussmaul respirations due to DKA P:   Supplemental O2 as needed to maintain SpO2 > 92%. Pulmonary hygiene. CXR intermittently.  RENAL A:   Lactic acidosis P:   Monitor  BMET intermittently Monitor I/Os Correct electrolytes as indicated IVFs per DKA protocol  GASTROINTESTINAL A:   GERD, chronic PPI use P:   SUP: IV pantoprazole. NPO for now.  HEMATOLOGIC A:   Anemia - chronic Supratherpeutic INR - on chronic coumadin for mechanical heart valve VTE Prophylaxis P:  Transfuse per usual ICU guidelines. Coumadin per pharmacy. SCD's. CBC and coags in AM.  INFECTIOUS A:   Shock - unclear etiology.  Hypovolemic +/- septic P:   BCx2 2/24 >>> UCx 2/24 >>> Sputum Cx 2/24 >>> Abx: Vanc, start date 2/24, day 1/x.  Abx: Zosyn, start date 2/24, day 1/x. PCT algorithm to limit abx exposure.  NEUROLOGIC A:   Acute metabolic encephalopathy P:   Monitor. Hold outpatient baclofen, lexapro, ambien.   Family updated: None.  Interdisciplinary Family Meeting v Palliative Care Meeting:  Due by: 3/6.   Rutherford Guys, [place] - C  Pulmonary & Critical Care Medicine Pager: [phone]  or [phone] 01/17/2015, 5:38 PM   PCCM ATTENDING: I have reviewed pt's initial presentation, consultants notes and hospital database in detail.  The above assessment and plan was formulated under my direction.  In summary: Severe DKA Persistent shock with lactic acidosis Concern for severe sepsis/shock  No clear source. With mech heart valve, need to be concerned about PVE  DKA protocol Treat as septic shock with usual resuscitation goals - CVP 8-12, MAP > 65 mmHg Broad spectrum abx Norepinephrine gtt  60 minutes of independent CCM time was provided by me   Billy Fischer, MD;  PCCM service; Mobile [phone]

## 2015-01-17 NOTE — ED Provider Notes (Signed)
Patient seen/examined in the Emergency Department in conjunction with Resident Physician Provider Hyman Hopes Patient presents for altered mental status.   Exam : pt is awake but confused, moaning in the bed.  She smells ketotic.  She is hypotensive Plan: will need altered mental status workup   Joya Gaskins, MD 01/17/15 1513

## 2015-01-17 NOTE — Progress Notes (Addendum)
ANTIBIOTIC CONSULT NOTE - INITIAL  Pharmacy Consult for vancomycin, zosyn, coumadin Indication: rule out sepsis , AVR/hx CVA   Allergies  Allergen Reactions  . Celebrex [Celecoxib] Rash  . Detrol [Tolterodine] Hives    Patient Measurements:   Adjusted Body Weight:   Vital Signs: Temp: 96.1 F (35.6 C) (02/29 1534) Temp Source: Rectal (02/29 1534) BP: 73/26 mmHg (02/29 1554) Pulse Rate: 104 (02/29 1534) Intake/Output from previous day:   Intake/Output from this shift:    Labs:  Recent Labs  01/17/15 1502  WBC 27.5*  HGB 9.3*  PLT 425*    Medical History: Past Medical History  Diagnosis Date  . CAD (coronary artery disease)   . Obesity   . Hypercholesteremia   . HTN (hypertension)   . Dyslipidemia   . Stroke syndrome   . Aortic stenosis   . Thrombophlebitis   . Diabetes   . Hyperlipidemia   . Rheumatoid arthritis(714.0)     Medications:  See EMR  Assessment: 54 yo female admitted with altered mental status, hyperglycemia and elevated lactic acid. Pharmacy is consulted to initiate antibiotics for rule-out sepsis. WBC 27.5, LA 11.15, T 96.1, hypotensive.  Pt had recent admission for DKA earlier this month.  Goal of Therapy:  Vancomycin trough level 15-20 mcg/ml  Plan:  -Zosyn 3.375 g IV q8h -Vancomycin 1500 mg IV x1 then 1g/12h -Watch renal fx, cultures, VT as indicated  Agapito Games, PharmD, BCPS Clinical Pharmacist Pager: [phone] 01/17/2015 4:34 PM   Addendum: Patient is on coumadin pta for aortic valve replacement and hx CVA. Pharmacy asked to resumed. INR on admission is above goal at 4.36 (goal 2.5-3.5). Home dose is 5mg  daily except 7.5mg  on Wednesdays. Of note, last admission 2/12-2/17, INR reached as high as 7.07 after 1 boosted dose of 10mg . She also got vitamin k. Hgb is stable at 9.3.  Plan: 1) No coumadin tonight 2) Daily INR  11-14-1976, PharmD, BCPS 01/17/2015, 6:13 PM

## 2015-01-17 NOTE — Progress Notes (Signed)
eLink Physician-Brief Progress Note Patient Name: Madison [name] DOB: [birth year] MRN: [id number]   Date of Service  01/17/2015  HPI/Events of Note  Shock, lactic 11 Line in place  Deep, no arrythmia f4om  eICU Interventions  Add sepsis proto, repeta lactioc, ensure 30 cc/kg given Add neo Septic shock protocol cvp Pull back line     Intervention Category Major Interventions: Hypotension - evaluation and management  [name]. 01/17/2015, 6:13 PM

## 2015-01-17 NOTE — ED Provider Notes (Signed)
Pt hypotensive She is in DKA  Also likely septic (lactate 11) Will need central line Emergent procedure as no next of kin available (only known contact is sister who has restraining order) D/w dr Tyson Alias will admit ICU  Joya Gaskins, MD 01/17/15 [phone]

## 2015-01-17 NOTE — Procedures (Signed)
Arterial Catheter Insertion Procedure Note CHELSI WARR [id number] [birth year]  Procedure: Insertion of Arterial Catheter  Indications: Blood pressure monitoring  Procedure Details Consent: Unable to obtain consent because of altered level of consciousness. Time Out: Verified patient identification, verified procedure, site/side was marked, verified correct patient position, special equipment/implants available, medications/allergies/relevent history reviewed, required imaging and test results available.  Performed  Maximum sterile technique was used including antiseptics, cap, gloves, gown, hand hygiene, mask and sheet. Skin prep: Chlorhexidine; local anesthetic administered 20 gauge catheter was inserted into left radial artery using the Seldinger technique.  Evaluation Blood flow good; BP tracing good. Complications: No apparent complications.  Patient had collateral blood flow prior to insertion.  Sterile technique used.  Successful on first attempt.    Elbert Ewings [place] 01/17/2015

## 2015-01-17 NOTE — ED Notes (Signed)
Patient resides at home.  She is seen by PT due to hx of CVA with left sided deficit.  Patient has hx of balance issues as well.  Patient would not answer the door today.  EMS called to scene.  They were able to get into the home.  Patient was found on the floor face up.  Initially unresponsive except to pain.  Patient became more responsive enroute.  Patient with hx of diabetes.  She has hx of poor compliance with cbg, fire reports multiple calls to home for same.  Patient also has hx of recent loss her son (custody) and she has restraining order against her sister.  #[phone].  Patient with IV in place prior to arrival.  Airway is patent.  PT number is [phone].  Patient responds only "ouch" to stimuli and pain.  Vitals enroute 114/68, cbg hr 107.  Patient has dark bruise noted to the right arm

## 2015-01-17 NOTE — ED Provider Notes (Signed)
CSN: [id number]     Arrival date & time 01/17/15  1457 History   First MD Initiated Contact with Patient 01/17/15 1458     Chief Complaint  Patient presents with  . Altered Mental Status  . Fall  . Hyperglycemia   (Consider location/radiation/quality/duration/timing/severity/associated sxs/prior Treatment) HPI Comments: 54 yo F hx of Type I DM, CVA, CAD s/p CABG, HTn, HLD, Aortic stenosis, presents with CC altered mental status.  Pt last seen normal Sunday.  Today several attempts to reach patient unsuccessful.  Her house door was opened at pt found face up, lying on floor, only responsive to pain.  EMS states CBG was high.  Pt slightly tachycardic with EMS, but other VS WNL.  EMS report pt becoming more responsive during transport.  On arrival pt awake, but disoriented, unable provide history, ROS, or participate in physical exam.    Patient is a 54 y.o. female presenting with altered mental status, fall, and hyperglycemia. The history is provided by the EMS personnel. The history is limited by the condition of the patient. No language interpreter was used.  Altered Mental Status Presenting symptoms: disorientation and unresponsiveness   Severity:  Severe Most recent episode:  Today Episode history:  Unable to specify Duration:  2 days Timing:  Constant Progression:  Improving Chronicity:  Recurrent Context: not taking medications as prescribed and recent illness   Fall  Hyperglycemia Associated symptoms: altered mental status     Past Medical History  Diagnosis Date  . CAD (coronary artery disease)   . Obesity   . Hypercholesteremia   . HTN (hypertension)   . Dyslipidemia   . Stroke syndrome   . Aortic stenosis   . Thrombophlebitis   . Diabetes   . Hyperlipidemia   . Rheumatoid arthritis(714.0)    Past Surgical History  Procedure Laterality Date  . Cesarean section    . Foot fracture surgery    . Knee surgery    . Neuroplasty / transposition median nerve at carpal  tunnel    . Left heart catheterization with coronary angiogram N/A 12/08/2014    Procedure: LEFT HEART CATHETERIZATION WITH CORONARY ANGIOGRAM;  Surgeon: Iran Ouch, MD;  Location: [facility] CATH LAB;  Service: Cardiovascular;  Laterality: N/A;  . Percutaneous coronary stent intervention (pci-s) N/A 12/09/2014    Procedure: PERCUTANEOUS CORONARY STENT INTERVENTION (PCI-S);  Surgeon: [name], MD;  Location: [facility] CATH LAB;  Service: Cardiovascular;  Laterality: N/A;   Family History  Problem Relation Age of Onset  . Stroke    . Heart disease Mother   . Heart disease Sister    History  Substance Use Topics  . Smoking status: Never Smoker   . Smokeless tobacco: Never Used  . Alcohol Use: No   OB History    No data available     Review of Systems  Unable to perform ROS: Mental status change      Allergies  Celebrex and Detrol  Home Medications   Prior to Admission medications   Medication Sig Start Date End Date Taking? Authorizing Provider  aspirin 81 MG tablet Take 1 tablet (81 mg total) by mouth daily. 10/11/14   Huey Bienenstock, MD  baclofen (LIORESAL) 10 MG tablet Take 1 tablet (10 mg total) by mouth 2 (two) times daily. 10/29/14   Baker Pierini, FNP  Blood Glucose Monitoring Suppl (ACCU-CHEK AVIVA) device Use as instructed 01/10/15 01/10/16  Romero Belling, MD  clopidogrel (PLAVIX) 75 MG tablet Take 1 tablet (75 mg total)  by mouth daily with breakfast. 12/15/14   Nishant Dhungel, MD  escitalopram (LEXAPRO) 20 MG tablet Take 20 mg by mouth daily.    Historical Provider, MD  feeding supplement, GLUCERNA SHAKE, (GLUCERNA SHAKE) LIQD Take 237 mLs by mouth 2 (two) times daily between meals. 12/15/14   Nishant Dhungel, MD  glucose blood (ACCU-CHEK AVIVA) test strip Use to check blood sugar 5 times per day Dx Code E10.10 And lancets 5/day 01/10/15   Romero Belling, MD  Insulin Detemir (LEVEMIR FLEXTOUCH) 100 UNIT/ML Pen Inject 15 Units into the skin 2 (two) times daily. Patient  taking differently: Inject 25 Units into the skin 2 (two) times daily.  12/15/14   Nishant Dhungel, MD  insulin lispro (HUMALOG KWIKPEN) 100 UNIT/ML KiwkPen Inject 6 units with evening meal 12/15/14   Nishant Dhungel, MD  lisinopril (PRINIVIL,ZESTRIL) 2.5 MG tablet Take 1 tablet (2.5 mg total) by mouth daily. 12/15/14   Nishant Dhungel, MD  methotrexate (RHEUMATREX) 2.5 MG tablet Take 1 tablet (2.5 mg total) by mouth 2 (two) times a week. Caution:Chemotherapy. Protect from light. 10/12/14   Baker Pierini, FNP  metoprolol tartrate (LOPRESSOR) 25 MG tablet Take 0.5 tablets (12.5 mg total) by mouth 2 (two) times daily. 12/15/14   Nishant Dhungel, MD  omeprazole (PRILOSEC) 40 MG capsule TAKE 1 CAPSULE (40 MG TOTAL) BY MOUTH DAILY. 11/05/14   Nelwyn Salisbury, MD  pravastatin (PRAVACHOL) 20 MG tablet TAKE 1 TABLET EVERY DAY 06/01/14   Baker Pierini, FNP  promethazine (PHENERGAN) 25 MG tablet Take 1 tablet (25 mg total) by mouth every 4 (four) hours as needed for nausea or vomiting. 10/08/14   Nelwyn Salisbury, MD  solifenacin (VESICARE) 5 MG tablet Take 0.5 tablets (2.5 mg total) by mouth daily. 12/29/14   Baker Pierini, FNP  traMADol (ULTRAM) 50 MG tablet TAKE 1 OR 2 TABLETS BY MOUTH EVERY 8 HOURS AS NEEDED 08/04/14   Baker Pierini, FNP  warfarin (COUMADIN) 5 MG tablet Take 1-1.5 tablets (5-7.5 mg total) by mouth daily at 6 PM. Take 5 mg Daily By Mouth on , Tues, Thurs, Fri, Sat & Sun, Take 7.5 mg By Mouth on mondays and Wednesdays . Patient taking differently: Take 5-7.5 mg by mouth daily at 6 PM. Take 5 mg Daily By Mouth on Mon, Tues, Thurs, Fri, Sat & Sun, Take 7.5 mg By Mouth on Wednesdays . 12/15/14   Nishant Dhungel, MD  zolpidem (AMBIEN) 10 MG tablet Take 1 tablet (10 mg total) by mouth at bedtime. 01/05/15   Albertine Grates, MD   There were no vitals taken for this visit. Physical Exam  Constitutional:  Appears older than stated age  HENT:  Head: Normocephalic and atraumatic.  Right Ear:  External ear normal.  Left Ear: External ear normal.  Oral mucosa dry  Eyes: Conjunctivae and EOM are normal. Pupils are equal, round, and reactive to light.  Neck: Normal range of motion. Neck supple.  Cardiovascular: Regular rhythm, normal heart sounds and intact distal pulses.   Tachycardia   Pulmonary/Chest: Effort normal and breath sounds normal. No respiratory distress. She has no wheezes. She has no rales. She exhibits no tenderness.  Abdominal: Soft. Bowel sounds are normal. She exhibits no distension and no mass. There is no tenderness. There is no rebound and no guarding.  Neurological: She is alert.  GCS 13 (M 6, V3, E 4).  Pt with left arm contracture.  Pt moving all other extremities, and withdraws to pain.  Unable  to follow commands as is disoriented.    Skin: Skin is warm and dry.  Nursing note and vitals reviewed.   ED Course  CENTRAL LINE Date/Time: 01/18/2015 12:59 AM Performed by: Jon Gills Authorized by: Jon Gills Consent: The procedure was performed in an emergent situation. Required items: required blood products, implants, devices, and special equipment available Patient identity confirmed: arm band, provided demographic data and hospital-assigned identification number Time out: Immediately prior to procedure a "time out" was called to verify the correct patient, procedure, equipment, support staff and site/side marked as required. Indications: vascular access Anesthesia: local infiltration Local anesthetic: lidocaine 1% with epinephrine Anesthetic total: 3 ml Patient sedated: no Preparation: skin prepped with 2% chlorhexidine Skin prep agent dried: skin prep agent completely dried prior to procedure Sterile barriers: all five maximum sterile barriers used - cap, mask, sterile gown, sterile gloves, and large sterile sheet Hand hygiene: hand hygiene performed prior to central venous catheter insertion Location details: right internal jugular Patient position:  Trendelenburg Catheter type: triple lumen Pre-procedure: landmarks identified Ultrasound guidance: yes Sterile ultrasound techniques: sterile gel and sterile probe covers were used Number of attempts: 1 Successful placement: yes Post-procedure: line sutured and dressing applied Assessment: blood return through all ports,  free fluid flow,  placement verified by x-ray and no pneumothorax on x-ray Patient tolerance: Patient tolerated the procedure well with no immediate complications   (including critical care time) Labs Review Labs Reviewed  CBG MONITORING, ED - Abnormal; Notable for the following:    Glucose-Capillary >600 (*)    All other components within normal limits  CULTURE, BLOOD (ROUTINE X 2)  CULTURE, BLOOD (ROUTINE X 2)  CBC WITH DIFFERENTIAL/PLATELET  URINALYSIS, ROUTINE W REFLEX MICROSCOPIC  URINE RAPID DRUG SCREEN (HOSP PERFORMED)  ETHANOL  SALICYLATE LEVEL  ACETAMINOPHEN LEVEL  I-STAT VENOUS BLOOD GAS, ED  I-STAT CHEM 8, ED  I-STAT CG4 LACTIC ACID, ED  I-STAT TROPOININ, ED    Imaging Review Ct Head Wo Contrast  01/17/2015   CLINICAL DATA:  Found down on floor. Acute altered mental status. Hyperglycemia.  EXAM: CT HEAD WITHOUT CONTRAST  TECHNIQUE: Contiguous axial images were obtained from the base of the skull through the vertex without contrast.  COMPARISON:  12/03/2014  FINDINGS: Exam is limited with motion artifact. Brain atrophy evident with extensive periventricular and subcortical white matter microvascular changes throughout both cerebral hemispheres.  Extensive encephalomalacia in the right posterior frontal, anterior frontal, parietal, and temporal lobes from remote infarcts. Bilateral occipital and left posterior frontal encephalomalacia also evident. No acute intracranial hemorrhage, definite new infarction, midline shift, herniation, hydrocephalus, or extra-axial fluid collection. Cisterns patent. Remote left cerebellar infarct. Mastoids and sinuses clear.  No gross skull abnormality.  IMPRESSION: Limited with motion artifact. Grossly stable exam with chronic bilateral infarcts, atrophy, and extensive white matter microvascular ischemic changes.   Electronically Signed   By: Judie Petit.  Shick M.D.   On: 01/17/2015 16:36   Dg Chest Portable 1 View  01/17/2015   CLINICAL DATA:  Altered mental status, fall, hyperglycemia  EXAM: PORTABLE CHEST - 1 VIEW  COMPARISON:  01/17/2015  FINDINGS: Cardiomediastinal silhouette is stable. Status post median sternotomy. There is right IJ central line with tip in right atrium. For distal SVC position the central line should be retracted about 4.7 cm. No pneumothorax.  IMPRESSION: No active disease.  Right IJ central line with tip in right atrium.   Electronically Signed   By: Natasha Mead M.D.   On: 01/17/2015 17:22  Dg Chest Portable 1 View  01/17/2015   CLINICAL DATA:  Altered mental status, fall, hyperglycemia  EXAM: PORTABLE CHEST - 1 VIEW  COMPARISON:  12/31/2014  FINDINGS: Borderline cardiomegaly. Status post median sternotomy. Again noted status post aortic valve replacement. No acute infiltrate or pleural effusion. No pulmonary edema.  IMPRESSION: Borderline cardiomegaly.  No active disease.   Electronically Signed   By: Natasha Mead M.D.   On: 01/17/2015 15:40     EKG Interpretation None      MDM   Final diagnoses:  Diabetic ketoacidosis with coma associated with other specified diabetes mellitus  Hypotension, unspecified hypotension type   54 yo F hx of Type I DM, CVA, CAD s/p CABG, HTn, HLD, Aortic stenosis, presents with CC altered mental status.   Physical exam as above.  Pt hypothermic to 35.6 degrees C, hypotensive to 70-80's systolic.  Pt unable to provide history currently, GCS 13 for confusion.    Code Sepsis initiated.  Labs revealed LA of 11, metabolic acidosis with pH 7.1, pCO2 18, bicarb of 6, elevated BUN/Cr.  INR supratherapeutic to 4.36.  Pt with leukocytosis to 27.5, normocyctic anemia 9.3.   Troponin 0.02.  Blood glucose 948.  CT head demonstrates no acute abnormality. CXR demonstrates no active disease.   Diagnosis of DKA, hypotension.  Pt covered with Vanc, Zosyn for possible infection as well.  Left EJ placed by myself for IV access, after multiple failed attempts at peripheral access.  R IJ central line placed byself as above for continued hypotension, multiple medication drips.  Pt given NS bolus X 2, insulin drip.  Critical care consulted for admission.    Jon Gills  Discussed pt with Dr. Bebe Shaggy.     Jon Gills, MD 01/18/15 1093  Joya Gaskins, MD 01/19/15 0730

## 2015-01-18 DIAGNOSIS — R509 Fever, unspecified: Secondary | ICD-10-CM | POA: Insufficient documentation

## 2015-01-18 DIAGNOSIS — E0810 Diabetes mellitus due to underlying condition with ketoacidosis without coma: Secondary | ICD-10-CM

## 2015-01-18 DIAGNOSIS — E1311 Other specified diabetes mellitus with ketoacidosis with coma: Secondary | ICD-10-CM

## 2015-01-18 LAB — CBC
HCT: 23 % — ABNORMAL LOW (ref 36.0–46.0)
HCT: 23.3 % — ABNORMAL LOW (ref 36.0–46.0)
HEMOGLOBIN: 7.6 g/dL — AB (ref 12.0–15.0)
Hemoglobin: 7.7 g/dL — ABNORMAL LOW (ref 12.0–15.0)
MCH: 26.2 pg (ref 26.0–34.0)
MCH: 26 pg (ref 26.0–34.0)
MCHC: 33 g/dL (ref 30.0–36.0)
MCHC: 33 g/dL (ref 30.0–36.0)
MCV: 79.3 fL (ref 78.0–100.0)
MCV: 78.7 fL (ref 78.0–100.0)
Platelets: 321 10*3/uL (ref 150–400)
Platelets: 310 10*3/uL (ref 150–400)
RBC: 2.9 MIL/uL — AB (ref 3.87–5.11)
RBC: 2.96 MIL/uL — ABNORMAL LOW (ref 3.87–5.11)
RDW: 17.1 % — ABNORMAL HIGH (ref 11.5–15.5)
RDW: 17.2 % — ABNORMAL HIGH (ref 11.5–15.5)
WBC: 21.3 10*3/uL — AB (ref 4.0–10.5)
WBC: 20.6 10*3/uL — AB (ref 4.0–10.5)

## 2015-01-18 LAB — TROPONIN I
TROPONIN I: 0.39 ng/mL — AB (ref <0.031)
TROPONIN I: 1 ng/mL — AB (ref <0.031)
TROPONIN I: 0.97 ng/mL — AB (ref <0.031)
TROPONIN I: 0.73 ng/mL — AB (ref <0.031)

## 2015-01-18 LAB — BASIC METABOLIC PANEL
ANION GAP: 9 (ref 5–15)
ANION GAP: 7 (ref 5–15)
Anion gap: 9 (ref 5–15)
Anion gap: 4 — ABNORMAL LOW (ref 5–15)
BUN: 32 mg/dL — ABNORMAL HIGH (ref 6–23)
BUN: 27 mg/dL — AB (ref 6–23)
BUN: 23 mg/dL (ref 6–23)
BUN: 16 mg/dL (ref 6–23)
CALCIUM: 7.7 mg/dL — AB (ref 8.4–10.5)
CHLORIDE: 119 mmol/L — AB (ref 96–112)
CHLORIDE: 119 mmol/L — AB (ref 96–112)
CHLORIDE: 116 mmol/L — AB (ref 96–112)
CO2: 16 mmol/L — ABNORMAL LOW (ref 19–32)
CO2: 18 mmol/L — AB (ref 19–32)
CO2: 21 mmol/L (ref 19–32)
CO2: 19 mmol/L (ref 19–32)
CREATININE: 1.26 mg/dL — AB (ref 0.50–1.10)
Calcium: 7.4 mg/dL — ABNORMAL LOW (ref 8.4–10.5)
Calcium: 7.6 mg/dL — ABNORMAL LOW (ref 8.4–10.5)
Calcium: 7.4 mg/dL — ABNORMAL LOW (ref 8.4–10.5)
Chloride: 123 mmol/L — ABNORMAL HIGH (ref 96–112)
Creatinine, Ser: 1.58 mg/dL — ABNORMAL HIGH (ref 0.50–1.10)
Creatinine, Ser: 1.39 mg/dL — ABNORMAL HIGH (ref 0.50–1.10)
Creatinine, Ser: 1.07 mg/dL (ref 0.50–1.10)
GFR calc Af Amer: 42 mL/min — ABNORMAL LOW (ref >90)
GFR calc Af Amer: 49 mL/min — ABNORMAL LOW (ref >90)
GFR calc Af Amer: 55 mL/min — ABNORMAL LOW (ref >90)
GFR calc Af Amer: 67 mL/min — ABNORMAL LOW (ref >90)
GFR calc non Af Amer: 42 mL/min — ABNORMAL LOW (ref >90)
GFR calc non Af Amer: 47 mL/min — ABNORMAL LOW (ref >90)
GFR calc non Af Amer: 58 mL/min — ABNORMAL LOW (ref >90)
GFR, EST NON AFRICAN AMERICAN: 36 mL/min — AB (ref >90)
GLUCOSE: 338 mg/dL — AB (ref 70–99)
GLUCOSE: 205 mg/dL — AB (ref 70–99)
Glucose, Bld: 72 mg/dL (ref 70–99)
Glucose, Bld: 197 mg/dL — ABNORMAL HIGH (ref 70–99)
Potassium: 3.3 mmol/L — ABNORMAL LOW (ref 3.5–5.1)
Potassium: 3.4 mmol/L — ABNORMAL LOW (ref 3.5–5.1)
Potassium: 4 mmol/L (ref 3.5–5.1)
Potassium: 3.4 mmol/L — ABNORMAL LOW (ref 3.5–5.1)
SODIUM: 144 mmol/L (ref 135–145)
Sodium: 146 mmol/L — ABNORMAL HIGH (ref 135–145)
Sodium: 148 mmol/L — ABNORMAL HIGH (ref 135–145)
Sodium: 142 mmol/L (ref 135–145)

## 2015-01-18 LAB — GLUCOSE, CAPILLARY
Glucose-Capillary: 220 mg/dL — ABNORMAL HIGH (ref 70–99)
Glucose-Capillary: 173 mg/dL — ABNORMAL HIGH (ref 70–99)
Glucose-Capillary: 211 mg/dL — ABNORMAL HIGH (ref 70–99)
Glucose-Capillary: 267 mg/dL — ABNORMAL HIGH (ref 70–99)
Glucose-Capillary: 310 mg/dL — ABNORMAL HIGH (ref 70–99)
Glucose-Capillary: 324 mg/dL — ABNORMAL HIGH (ref 70–99)
Glucose-Capillary: 436 mg/dL — ABNORMAL HIGH (ref 70–99)
Glucose-Capillary: 516 mg/dL — ABNORMAL HIGH (ref 70–99)
Glucose-Capillary: 88 mg/dL (ref 70–99)
Glucose-Capillary: 147 mg/dL — ABNORMAL HIGH (ref 70–99)
Glucose-Capillary: 110 mg/dL — ABNORMAL HIGH (ref 70–99)
Glucose-Capillary: 114 mg/dL — ABNORMAL HIGH (ref 70–99)
Glucose-Capillary: 156 mg/dL — ABNORMAL HIGH (ref 70–99)
Glucose-Capillary: 159 mg/dL — ABNORMAL HIGH (ref 70–99)
Glucose-Capillary: 64 mg/dL — ABNORMAL LOW (ref 70–99)
Glucose-Capillary: 191 mg/dL — ABNORMAL HIGH (ref 70–99)

## 2015-01-18 LAB — PROTIME-INR
INR: 6.14 (ref 0.00–1.49)
PROTHROMBIN TIME: 54.9 s — AB (ref 11.6–15.2)

## 2015-01-18 LAB — MAGNESIUM: Magnesium: 1.7 mg/dL (ref 1.5–2.5)

## 2015-01-18 LAB — CORTISOL: CORTISOL PLASMA: 64.7 ug/dL

## 2015-01-18 LAB — PHOSPHORUS: PHOSPHORUS: 1.5 mg/dL — AB (ref 2.3–4.6)

## 2015-01-18 LAB — PROCALCITONIN: PROCALCITONIN: 3.64 ng/mL

## 2015-01-18 LAB — LIPASE, BLOOD: Lipase: 37 U/L (ref 11–59)

## 2015-01-18 MED ORDER — CEFTRIAXONE SODIUM IN DEXTROSE 40 MG/ML IV SOLN
2.0000 g | INTRAVENOUS | Status: DC
  Administered 2015-01-18 – 2015-01-19 (×2): 2 g via INTRAVENOUS
  Filled 2015-01-18 (×3): qty 50

## 2015-01-18 MED ORDER — MAGNESIUM SULFATE 2 GM/50ML IV SOLN
2.0000 g | Freq: Once | INTRAVENOUS | Status: AC
  Administered 2015-01-18: 2 g via INTRAVENOUS
  Filled 2015-01-18: qty 50

## 2015-01-18 MED ORDER — FAMOTIDINE IN NACL 20-0.9 MG/50ML-% IV SOLN
20.0000 mg | INTRAVENOUS | Status: DC
  Administered 2015-01-19: 20 mg via INTRAVENOUS
  Filled 2015-01-18 (×3): qty 50

## 2015-01-18 MED ORDER — SODIUM CHLORIDE 0.9 % IV SOLN
INTRAVENOUS | Status: DC
  Administered 2015-01-18: 10 mL/h via INTRAVENOUS
  Administered 2015-01-19: 21:00:00 via INTRAVENOUS

## 2015-01-18 MED ORDER — POTASSIUM CHLORIDE 10 MEQ/50ML IV SOLN
10.0000 meq | INTRAVENOUS | Status: AC
  Administered 2015-01-18 (×4): 10 meq via INTRAVENOUS

## 2015-01-18 MED ORDER — DEXTROSE 50 % IV SOLN
INTRAVENOUS | Status: AC
  Administered 2015-01-18: 25 mL
  Filled 2015-01-18: qty 50

## 2015-01-18 MED ORDER — POTASSIUM CHLORIDE 10 MEQ/50ML IV SOLN
INTRAVENOUS | Status: AC
  Filled 2015-01-18: qty 200

## 2015-01-18 MED ORDER — SODIUM CHLORIDE 0.9 % IV SOLN
10.0000 mg | Freq: Two times a day (BID) | INTRAVENOUS | Status: DC
  Administered 2015-01-18: 10 mg via INTRAVENOUS
  Filled 2015-01-18 (×2): qty 1

## 2015-01-18 MED ORDER — INSULIN ASPART 100 UNIT/ML ~~LOC~~ SOLN
0.0000 [IU] | SUBCUTANEOUS | Status: DC
  Administered 2015-01-18: 1 [IU] via SUBCUTANEOUS
  Administered 2015-01-18 (×2): 2 [IU] via SUBCUTANEOUS
  Administered 2015-01-19 (×2): 5 [IU] via SUBCUTANEOUS
  Administered 2015-01-19: 7 [IU] via SUBCUTANEOUS
  Administered 2015-01-19: 2 [IU] via SUBCUTANEOUS
  Administered 2015-01-19: 3 [IU] via SUBCUTANEOUS
  Administered 2015-01-20: 1 [IU] via SUBCUTANEOUS

## 2015-01-18 MED ORDER — HYDROCORTISONE NA SUCCINATE PF 100 MG IJ SOLR: 50.0000 mg | Freq: Four times a day (QID) | INTRAMUSCULAR | Status: DC

## 2015-01-18 MED ORDER — INSULIN GLARGINE 100 UNIT/ML ~~LOC~~ SOLN
20.0000 [IU] | Freq: Every day | SUBCUTANEOUS | Status: DC
  Administered 2015-01-18 – 2015-01-19 (×2): 20 [IU] via SUBCUTANEOUS
  Filled 2015-01-18 (×2): qty 0.2

## 2015-01-18 MED ORDER — DEXTROSE 5 % IV SOLN
1.0000 mg | Freq: Once | INTRAVENOUS | Status: AC
  Administered 2015-01-18: 1 mg via INTRAVENOUS
  Filled 2015-01-18: qty 0.1

## 2015-01-18 MED ORDER — ONDANSETRON HCL 4 MG/2ML IJ SOLN
4.0000 mg | Freq: Three times a day (TID) | INTRAMUSCULAR | Status: DC | PRN
  Administered 2015-01-18 – 2015-01-19 (×2): 4 mg via INTRAVENOUS
  Filled 2015-01-18: qty 2

## 2015-01-18 MED ORDER — METOCLOPRAMIDE HCL 5 MG/ML IJ SOLN
5.0000 mg | Freq: Three times a day (TID) | INTRAMUSCULAR | Status: DC
Start: 2015-01-18 — End: 2015-01-20
  Administered 2015-01-18 – 2015-01-20 (×6): 5 mg via INTRAVENOUS
  Filled 2015-01-18 (×9): qty 1

## 2015-01-18 MED ORDER — SODIUM PHOSPHATE 3 MMOLE/ML IV SOLN
30.0000 mmol | Freq: Once | INTRAVENOUS | Status: AC
  Administered 2015-01-18: 30 mmol via INTRAVENOUS
  Filled 2015-01-18: qty 10

## 2015-01-18 MED ORDER — ONDANSETRON HCL 4 MG/2ML IJ SOLN
INTRAMUSCULAR | Status: AC
  Filled 2015-01-18: qty 2

## 2015-01-18 MED ORDER — POTASSIUM CHLORIDE 10 MEQ/50ML IV SOLN
10.0000 meq | INTRAVENOUS | Status: AC
  Administered 2015-01-18 – 2015-01-19 (×4): 10 meq via INTRAVENOUS
  Filled 2015-01-18: qty 50

## 2015-01-18 NOTE — Progress Notes (Signed)
PULMONARY / CRITICAL CARE MEDICINE   Name: Madison [name] MRN: [id number] DOB: June 21, 1961    ADMISSION DATE:  01/17/2015 CONSULTATION DATE:  01/17/15  REFERRING MD :  EDP  CHIEF COMPLAINT:  Altered Mental Status  INITIAL PRESENTATION: 54 y/o F w/ PMHx of HTN, HLD, DM type II, AS s/p valve replacement, and CAD, admitted on 01/17/15 after PT made visit to home and no on answered doorbell. EMS dispatched, found patient lying on floor, only responsive to painful stimuli. Patient was seen normal by family 1 day prior. In ED, patient still unresponsive, found to be in severe DKA and hypotensive despite aggressive fluid resuscitation.   STUDIES:  2/29 CT head >>> Chronic b/l infarcts, atrophy. No acute changes. 2/29 CXR NACPD, RIJ CVL in RA  SIGNIFICANT EVENTS: 2/29 >>> Admitted  SUBJECTIVE: Confused, follows commands.   VITAL SIGNS: Temp:  [96.1 F (35.6 C)-99.3 F (37.4 C)] 99.3 F (37.4 C) (03/01 0825) Pulse Rate:  [80-154] 91 (03/01 0815) Resp:  [13-27] 20 (03/01 0815) BP: (66-110)/(26-51) 110/44 mmHg (03/01 0800) SpO2:  [87 %-100 %] 99 % (03/01 0815) Arterial Line BP: (84-127)/(32-45) 122/45 mmHg (03/01 0815)   HEMODYNAMICS: CVP:  [6 mmHg-9 mmHg] 9 mmHg   INTAKE / OUTPUT:  Intake/Output Summary (Last 24 hours) at 01/18/15 0834 Last data filed at 01/18/15 0800  Gross per 24 hour  Intake 5635.02 ml  Output   2015 ml  Net 3620.02 ml    PHYSICAL EXAMINATION: General:  Awake, alert, no acute distress. Follows commands.  Neuro:  Moves all extremities spontaneously. Confused, int follows commands HEENT: PERRL, EOMI. Dry mucus membranes, dried blood in mouth Cardiovascular:  RRR, no gallops or rubs. 3/6 systolic murmur, mechanical valve click Lungs:  Air entry clear, equal bilaterally. No wheezes, rales, or rhonchi.  Abdomen:  Soft, NT, ND, BS +  Musculoskeletal:  No gross deformities. No edema.  Skin:  No rashes, cyanosis, or lesions.   LABS:  CBC  Recent Labs Lab  01/17/15 1502 01/17/15 1840 01/18/15 0500  WBC 27.5* 25.9* 21.3*  HGB 9.3* 7.6* 7.6*  HCT 33.2* 25.1* 23.0*  PLT 425* 392 321   Coag's  Recent Labs Lab 01/17/15 1545 01/17/15 1840 01/18/15 0500  APTT  --  34  --   INR 4.36* 4.28* 6.14*   BMET  Recent Labs Lab 01/17/15 2121 01/18/15 0100 01/18/15 0500  NA 142 144 146*  K 3.4* 3.3* 3.4*  CL 113* 119* 119*  CO2 12* 16* 18*  BUN 37* 32* 27*  CREATININE 2.24* 1.58* 1.39*  GLUCOSE 611* 338* 205*   Electrolytes  Recent Labs Lab 01/17/15 2121 01/18/15 0100 01/18/15 0500  CALCIUM 7.4* 7.4* 7.6*  MG  --   --  1.7  PHOS  --   --  1.5*   Sepsis Markers  Recent Labs Lab 01/17/15 1545 01/17/15 1840 01/17/15 2056 01/18/15 0500  LATICACIDVEN 11.15* 6.1* 3.1*  --   PROCALCITON  --  3.86  --  3.64    Liver Enzymes  Recent Labs Lab 01/17/15 1840  AST 19  ALT 17  ALKPHOS 75  BILITOT 1.7*  ALBUMIN 2.7*   Cardiac Enzymes  Recent Labs Lab 01/17/15 1840 01/17/15 1939 01/18/15 0100  TROPONINI 0.04* 0.06* 0.39*   Glucose  Recent Labs Lab 01/18/15 0111 01/18/15 0206 01/18/15 0303 01/18/15 0407 01/18/15 0507 01/18/15 0806  GLUCAP 324* 267* 211* 220* 173* 88    Imaging Ct Head Wo Contrast  01/17/2015   CLINICAL DATA:  Found down on floor. Acute altered mental status. Hyperglycemia.  EXAM: CT HEAD WITHOUT CONTRAST  TECHNIQUE: Contiguous axial images were obtained from the base of the skull through the vertex without contrast.  COMPARISON:  12/03/2014  FINDINGS: Exam is limited with motion artifact. Brain atrophy evident with extensive periventricular and subcortical white matter microvascular changes throughout both cerebral hemispheres.  Extensive encephalomalacia in the right posterior frontal, anterior frontal, parietal, and temporal lobes from remote infarcts. Bilateral occipital and left posterior frontal encephalomalacia also evident. No acute intracranial hemorrhage, definite new infarction,  midline shift, herniation, hydrocephalus, or extra-axial fluid collection. Cisterns patent. Remote left cerebellar infarct. Mastoids and sinuses clear. No gross skull abnormality.  IMPRESSION: Limited with motion artifact. Grossly stable exam with chronic bilateral infarcts, atrophy, and extensive white matter microvascular ischemic changes.   Electronically Signed   By: Judie Petit.  Shick M.D.   On: 01/17/2015 16:36   Dg Chest Portable 1 View  01/17/2015   CLINICAL DATA:  Altered mental status, fall, hyperglycemia  EXAM: PORTABLE CHEST - 1 VIEW  COMPARISON:  01/17/2015  FINDINGS: Cardiomediastinal silhouette is stable. Status post median sternotomy. There is right IJ central line with tip in right atrium. For distal SVC position the central line should be retracted about 4.7 cm. No pneumothorax.  IMPRESSION: No active disease.  Right IJ central line with tip in right atrium.   Electronically Signed   By: Natasha Mead M.D.   On: 01/17/2015 17:22   Dg Chest Portable 1 View  01/17/2015   CLINICAL DATA:  Altered mental status, fall, hyperglycemia  EXAM: PORTABLE CHEST - 1 VIEW  COMPARISON:  12/31/2014  FINDINGS: Borderline cardiomegaly. Status post median sternotomy. Again noted status post aortic valve replacement. No acute infiltrate or pleural effusion. No pulmonary edema.  IMPRESSION: Borderline cardiomegaly.  No active disease.   Electronically Signed   By: Natasha Mead M.D.   On: 01/17/2015 15:40     ASSESSMENT / PLAN:  PULMONARY A: No acute issues P:   Supplemental O2, keep SpO2 >92% pcxr assessment for edema  CARDIOVASCULAR CVL RIJ 2/29 >>> A:  Shock H/o CAD s/p PCI 11/2014. Troponin 0.39 this AM AS s/p mechanical AVR; INR supratherapeutic HTN HLD P:  Levophed gtt, goal MAP >60, sys 90 with output 15-20 cc/kg Goal CVP 8-10 Continue D5 1/2NS @ 125 cc/hr Continue to cycle troponins Repeat echo for veg, may need TEE Re assess aortic valve fxn, has low diastolic, at risk AI Coumadin per  pharmacy ASA + Plavix, low threshold to hold, inr noted Statin Cortisol assessment, no role roids  RENAL A:   AKI, improving, ATN Lactic acidosis, improving Hypokalemia Hypophosphatemia P:   BMP to q12h Replete electrolytes as necessary; given K, phos this AM Mag supp Avoid saline  GASTROINTESTINAL A:   GERD GI PPx P:   Pepcid NPO for now SLP assessment  HEMATOLOGIC A:   Anemia; Hb 7.6 this AM, dilutional AS s/p AVR on Coumadin Supratherapeutic INR; 6.14 this AM DVT/PE PPx P:  Transfuse for Hb <7.0  Coumadin per pharmacy; goal INR 2.5 - 3.0 consider low dose vit K  In setting low hct and limited reserve hct at 1800  INFECTIOUS A:   Shock, unknown etiology, favor sepsis Procalcitonin 3.64 in setting ARF R/o endocarditis P:   BCx2 2/29 UC 2/29 Abx: Vanco, start date 2/29 >>> Abx: Zosyn, start date 2/29 >>>3/1 Ceftriaxone 3/1>>>  Pct unimpressive, repeat to limit abx exposure escalate to 2 grams daily for now ECho  assessment May need TEE  ENDOCRINE A:   Severe DKA resolved, gap closed DM type II w/ h/o non-compliance   P:   Insulin gtt; transition to Lantus 20 units + ISS after 2 normal AG IVF as above  Must keep d5 in fluids in order to give lantus  NEUROLOGIC A:   Acute Metabolic Encephalopathy;  P:   Hold Baclofen, Ambien, Lexapro  TODAY'S SUMMARY: CBG's controlled, AG closed, HCO3 18. Still confused. WBC's improving.      Lauris Chroman, MD PGY-2 Internal Medicine Pager: [phone]  STAFF NOTE: Cindi Carbon, MD FACP have personally reviewed patient's available data, including medical history, events of note, physical examination and test results as part of my evaluation. I have discussed with resident/NP and other care providers such as pharmacist, RN and RRT. In addition, I personally evaluated patient and elicited key findings of: DKA resolved, concern remains sepsis, needs echo, slp, dc insulin drip, pressors hope to reduce with  lower MAp goal, obtain lipase, pct repeat, more awake, change zosyn to cefriaxone The patient is critically ill with multiple organ systems failure and requires high complexity decision making for assessment and support, frequent evaluation and titration of therapies, application of advanced monitoring technologies and extensive interpretation of multiple databases.   Critical Care Time devoted to patient care services described in this note is30  Minutes. This time reflects time of care of this signee: Rory Percy, MD FACP. This critical care time does not reflect procedure time, or teaching time or supervisory time of PA/NP/Med student/Med Resident etc but could involve care discussion time. Rest per NP/medical resident whose note is outlined above and that I agree with   Mcarthur Rossetti. Tyson Alias, MD, FACP Pgr: [phone] [facility] 01/18/2015 10:06 AM

## 2015-01-18 NOTE — Progress Notes (Signed)
ANTICOAGULATION CONSULT NOTE - Follow Up Consult  Pharmacy Consult for warfarin Indication: mechanical AVR, hx CVA  Allergies  Allergen Reactions  . Celebrex [Celecoxib] Rash  . Detrol [Tolterodine] Hives    Patient Measurements:   Heparin Dosing Weight:   Vital Signs: Temp: 99.3 F (37.4 C) (03/01 0825) Temp Source: Oral (03/01 0825) BP: 110/44 mmHg (03/01 0800) Pulse Rate: 92 (03/01 0830)  Labs:  Recent Labs  01/17/15 1502 01/17/15 1545  01/17/15 1840 01/17/15 1939 01/17/15 2121 01/18/15 0100 01/18/15 0500  HGB 9.3*  --   --  7.6*  --   --   --  7.6*  HCT 33.2*  --   --  25.1*  --   --   --  23.0*  PLT 425*  --   --  392  --   --   --  321  APTT  --   --   --  34  --   --   --   --   LABPROT  --  42.0*  --  41.4*  --   --   --  54.9*  INR  --  4.36*  --  4.28*  --   --   --  6.14*  CREATININE  --   --   < > 2.58*  --  2.24* 1.58* 1.39*  TROPONINI  --   --   --  0.04* 0.06*  --  0.39*  --   < > = values in this interval not displayed.  Estimated Creatinine Clearance: 44.3 mL/min (by C-G formula based on Cr of 1.39).  Assessment: 54 yof on chronic coumadin for hx mechanical AVR continues on coumadin for anticoagulation. INR is supratherapeutic at 6.14. H/H low at 7.6/23 and platelets are WNL. No bleeding noted.  Goal of Therapy:  INR 2.5-3.5 Monitor platelets by anticoagulation protocol: Yes   Plan:  1. No coumadin tonight 2. F/u AM INR  Jayke Caul, Drake Leach 01/18/2015,8:41 AM

## 2015-01-18 NOTE — Evaluation (Signed)
Clinical/Bedside Swallow Evaluation Patient Details  Name: Madison [name] MRN: [id number] Date of Birth: [birth year]  Today's Date: 01/18/2015 Time: SLP Start Time (ACUTE ONLY): 1438 SLP Stop Time (ACUTE ONLY): 1450 SLP Time Calculation (min) (ACUTE ONLY): 12 min  Past Medical History:  Past Medical History  Diagnosis Date  . CAD (coronary artery disease)   . Obesity   . Hypercholesteremia   . HTN (hypertension)   . Dyslipidemia   . Stroke syndrome   . Aortic stenosis   . Thrombophlebitis   . Diabetes   . Hyperlipidemia   . Rheumatoid arthritis(714.0)    Past Surgical History:  Past Surgical History  Procedure Laterality Date  . Cesarean section    . Foot fracture surgery    . Knee surgery    . Neuroplasty / transposition median nerve at carpal tunnel    . Left heart catheterization with coronary angiogram N/A 12/08/2014    Procedure: LEFT HEART CATHETERIZATION WITH CORONARY ANGIOGRAM;  Surgeon: Iran Ouch, MD;  Location: [facility] CATH LAB;  Service: Cardiovascular;  Laterality: N/A;  . Percutaneous coronary stent intervention (pci-s) N/A 12/09/2014    Procedure: PERCUTANEOUS CORONARY STENT INTERVENTION (PCI-S);  Surgeon: [name], MD;  Location: [facility] CATH LAB;  Service: Cardiovascular;  Laterality: N/A;   HPI:  54 y.o. with history of CAD, DM, HTN, stroke syndrome admitted after being found pt face up, lying on floor, only responsive to pain. Found to be in severe  DKA and hypotensive. CT revealed chronic bilateral infarcts, atrophy, and extensive white matter microvascular ischemic changes.CXR 2/29 No active disease. Pt has had multiple bedside swallows with recommendations for regular and Dys 3 textures and thin liquids.    Assessment / Plan / Recommendation Clinical Impression  Pt is presently confused requiring moderate cues to attend to SLP and po's; language of confusion exhibited, externally distracted by mittens. Delayed, strong cough following thin liquid  (one of 5  trials) and suspected delayed swallow intiation. Given current confusion, possible larygneal compromise and chronic strokes, recommend likely short term diet modifications of Dys 1 and nectar thick liquids, no straws, pills crushed. ST will intervene for safety and ability to upgrade.    Aspiration Risk  Moderate    Diet Recommendation Dysphagia 1 (Puree);Nectar-thick liquid   Liquid Administration via: Cup;No straw Medication Administration: Crushed with puree Supervision: Patient able to self feed;Full supervision/cueing for compensatory strategies Compensations: Slow rate;Small sips/bites Postural Changes and/or Swallow Maneuvers: Seated upright 90 degrees;Upright 30-60 min after meal    Other  Recommendations Oral Care Recommendations: Oral care BID   Follow Up Recommendations   (TBD)    Frequency and Duration min 2x/week  2 weeks   Pertinent Vitals/Pain odnophagia      Swallow Study           Oral/Motor/Sensory Function Overall Oral Motor/Sensory Function:  (no significant weakness )   Ice Chips Ice chips: Within functional limits Presentation: Spoon   Thin Liquid Thin Liquid: Within functional limits Presentation: Cup Pharyngeal  Phase Impairments: Cough - Delayed;Suspected delayed Swallow    Nectar Thick Nectar Thick Liquid: Not tested   Honey Thick Honey Thick Liquid: Not tested   Puree Puree: Impaired Pharyngeal Phase Impairments: Suspected delayed Swallow   Solid   GO    Solid: Not tested       Royce Macadamia 01/18/2015,3:15 PM  Breck Coons Lonell Face.Ed [employer] [phone]

## 2015-01-18 NOTE — Progress Notes (Signed)
eLink Physician-Brief Progress Note Patient Name: Madison [name] DOB: [birth year] MRN: [id number]   Date of Service  01/18/2015  HPI/Events of Note  Best Practice  eICU Interventions  Patient with coagulopathy - placed on stress ulcer propy     Intervention Category Intermediate Interventions: Best-practice therapies (e.g. DVT, beta blocker, etc.)  DETERDING,ELIZABETH 01/18/2015, 6:34 AM

## 2015-01-18 NOTE — Progress Notes (Signed)
UR Completed.  [phone]  

## 2015-01-18 NOTE — Progress Notes (Signed)
CRITICAL VALUE ALERT  Critical value received:  *trop 1.00  Date of notification:  01/18/15  Time of notification:  0925  Critical value read back: yes  Nurse who received alert:  LBass  MD notified (1st page):  Yetta Barre  Time of first page: 0925  MD notified (2nd page):Jones  Time of second page:  Responding MD: Yetta Barre Time MD responded: Yetta Barre

## 2015-01-18 NOTE — Progress Notes (Signed)
CRITICAL VALUE ALERT  Critical value received: pt 54.9  inr 6.14  Date of notification:  01/18/15  Time of notification:  0625  Critical value read back: yes  Nurse who received alert:  km  MD notified (1st page):  elink  Time MD responded:  [phone]

## 2015-01-18 NOTE — Progress Notes (Signed)
eLink Physician-Brief Progress Note Patient Name: Madison [name] DOB: 07/02/1961 MRN: [id number]   Date of Service  01/18/2015  HPI/Events of Note  Hypokalemia and hypophos  eICU Interventions  Potassium replaced     Intervention Category Intermediate Interventions: Electrolyte abnormality - evaluation and management  DETERDING,ELIZABETH 01/18/2015, 6:14 AM

## 2015-01-18 NOTE — Progress Notes (Signed)
Patient was active with [facility] Care Management prior to admission.  [facility] Liaison will continue to follow up for disposition and D/C planning needs.  Of note, [facility] Care Management does not replace or interfere with any services arranged by the inpatient care management team.  For questions, please contact Charlesetta Shanks, RN, BSN, [facility] Liaison at [phone].

## 2015-01-18 NOTE — Progress Notes (Signed)
eLink Physician-Brief Progress Note Patient Name: NATOYA VISCOMI DOB: 1961-04-20 MRN: [id number]   Date of Service  01/18/2015  HPI/Events of Note  Review of BMET - K+ = 3.4 and Creatinine = 1.07.  eICU Interventions  Will replace K+.     Intervention Category Intermediate Interventions: Electrolyte abnormality - evaluation and management  Sommer,Steven Eugene 01/18/2015, 10:18 PM

## 2015-01-19 ENCOUNTER — Inpatient Hospital Stay (HOSPITAL_COMMUNITY): Payer: Commercial Managed Care - HMO

## 2015-01-19 ENCOUNTER — Encounter (HOSPITAL_COMMUNITY): Payer: Self-pay | Admitting: General Practice

## 2015-01-19 DIAGNOSIS — R509 Fever, unspecified: Secondary | ICD-10-CM

## 2015-01-19 DIAGNOSIS — E0811 Diabetes mellitus due to underlying condition with ketoacidosis with coma: Secondary | ICD-10-CM

## 2015-01-19 LAB — GLUCOSE, CAPILLARY
GLUCOSE-CAPILLARY: 110 mg/dL — AB (ref 70–99)
GLUCOSE-CAPILLARY: 267 mg/dL — AB (ref 70–99)
GLUCOSE-CAPILLARY: 214 mg/dL — AB (ref 70–99)
Glucose-Capillary: 168 mg/dL — ABNORMAL HIGH (ref 70–99)
Glucose-Capillary: 283 mg/dL — ABNORMAL HIGH (ref 70–99)
Glucose-Capillary: 338 mg/dL — ABNORMAL HIGH (ref 70–99)

## 2015-01-19 LAB — CBC
HCT: 23.3 % — ABNORMAL LOW (ref 36.0–46.0)
HEMOGLOBIN: 7.4 g/dL — AB (ref 12.0–15.0)
MCH: 25.7 pg — ABNORMAL LOW (ref 26.0–34.0)
MCHC: 31.8 g/dL (ref 30.0–36.0)
MCV: 80.9 fL (ref 78.0–100.0)
Platelets: 242 10*3/uL (ref 150–400)
RBC: 2.88 MIL/uL — ABNORMAL LOW (ref 3.87–5.11)
RDW: 17.6 % — ABNORMAL HIGH (ref 11.5–15.5)
WBC: 12.4 10*3/uL — ABNORMAL HIGH (ref 4.0–10.5)

## 2015-01-19 LAB — URINE CULTURE
COLONY COUNT: NO GROWTH
CULTURE: NO GROWTH

## 2015-01-19 LAB — PROTIME-INR
INR: 3.43 — ABNORMAL HIGH (ref 0.00–1.49)
Prothrombin Time: 34.9 seconds — ABNORMAL HIGH (ref 11.6–15.2)

## 2015-01-19 LAB — BASIC METABOLIC PANEL
Anion gap: 6 (ref 5–15)
BUN: 8 mg/dL (ref 6–23)
CO2: 19 mmol/L (ref 19–32)
CREATININE: 0.87 mg/dL (ref 0.50–1.10)
Calcium: 7.5 mg/dL — ABNORMAL LOW (ref 8.4–10.5)
Chloride: 113 mmol/L — ABNORMAL HIGH (ref 96–112)
GFR calc Af Amer: 86 mL/min — ABNORMAL LOW (ref >90)
GFR, EST NON AFRICAN AMERICAN: 74 mL/min — AB (ref >90)
GLUCOSE: 294 mg/dL — AB (ref 70–99)
Potassium: 4.5 mmol/L (ref 3.5–5.1)
Sodium: 138 mmol/L (ref 135–145)

## 2015-01-19 LAB — TROPONIN I: Troponin I: 0.53 ng/mL (ref <0.031)

## 2015-01-19 LAB — PROCALCITONIN: Procalcitonin: 1.81 ng/mL

## 2015-01-19 MED ORDER — INSULIN GLARGINE 100 UNIT/ML ~~LOC~~ SOLN
20.0000 [IU] | Freq: Two times a day (BID) | SUBCUTANEOUS | Status: DC
  Administered 2015-01-19 – 2015-01-20 (×2): 20 [IU] via SUBCUTANEOUS
  Filled 2015-01-19 (×4): qty 0.2

## 2015-01-19 MED ORDER — WARFARIN - PHARMACIST DOSING INPATIENT
Freq: Every day | Status: DC
  Administered 2015-01-19: 18:00:00
  Administered 2015-01-22: 1

## 2015-01-19 MED ORDER — WARFARIN SODIUM 2 MG PO TABS
2.0000 mg | ORAL_TABLET | Freq: Once | ORAL | Status: AC
  Administered 2015-01-19: 2 mg via ORAL
  Filled 2015-01-19 (×2): qty 1

## 2015-01-19 MED ORDER — ALUM & MAG HYDROXIDE-SIMETH 200-200-20 MG/5ML PO SUSP
30.0000 mL | ORAL | Status: DC | PRN
  Administered 2015-01-19 – 2015-01-21 (×3): 30 mL via ORAL
  Filled 2015-01-19 (×3): qty 30

## 2015-01-19 MED ORDER — ZOLPIDEM TARTRATE 5 MG PO TABS
5.0000 mg | ORAL_TABLET | Freq: Once | ORAL | Status: AC
  Administered 2015-01-19: 5 mg via ORAL
  Filled 2015-01-19: qty 1

## 2015-01-19 NOTE — Progress Notes (Signed)
PULMONARY / CRITICAL CARE MEDICINE   Name: Madison [name] MRN: [id number] DOB: 07/23/1961    ADMISSION DATE:  01/17/2015 CONSULTATION DATE:  01/17/15  REFERRING MD :  EDP  CHIEF COMPLAINT:  Altered Mental Status  INITIAL PRESENTATION: 54 y/o F w/ PMHx of HTN, HLD, DM type II, AS s/p valve replacement, and CAD, admitted on 01/17/15 after PT made visit to home and no on answered doorbell. EMS dispatched, found patient lying on floor, only responsive to painful stimuli. Patient was seen normal by family 1 day prior. In ED, patient still unresponsive, found to be in severe DKA and hypotensive despite aggressive fluid resuscitation.   STUDIES:  2/29 CT head >>> Chronic b/l infarcts, atrophy. No acute changes. 2/29 CXR >>> NACPD, RIJ CVL in RA 3/2 CXR >>> NACPD, no pulmonary edema  SIGNIFICANT EVENTS: 2/29 >>> Admitted 3/1- improved, pressors dced late  SUBJECTIVE: Doing much better this AM, alert and oriented, still slightly confused. Off Levophed.   VITAL SIGNS: Temp:  [97.7 F (36.5 C)-99.5 F (37.5 C)] 97.7 F (36.5 C) (03/02 0400) Pulse Rate:  [71-101] 71 (03/02 0500) Resp:  [0-29] 22 (03/02 0500) BP: (88-149)/(33-65) 108/57 mmHg (03/02 0500) SpO2:  [97 %-100 %] 98 % (03/02 0500) Arterial Line BP: (91-187)/(33-74) 133/51 mmHg (03/02 0500)   HEMODYNAMICS: CVP:  [7 mmHg-14 mmHg] 13 mmHg: CVP 10 mm Hg at bedside this AM  INTAKE / OUTPUT:  Intake/Output Summary (Last 24 hours) at 01/19/15 0715 Last data filed at 01/19/15 0514  Gross per 24 hour  Intake 3541.14 ml  Output   1415 ml  Net 2126.14 ml    PHYSICAL EXAMINATION: General:  Awake, alert, oriented, no acute distress. Speech improved.  Neuro:  Moves all extremities spontaneously. A&O x3, still w/ mild confusion HEENT: PERRL, EOMI. Moist mucus membranes Cardiovascular:  RRR, no gallops or rubs. 3/6 systolic murmur, mechanical valve click Lungs:  Air entry clear, equal bilaterally. No wheezes, rales, or rhonchi.   Abdomen:  Soft, NT, ND, BS +  Musculoskeletal:  No gross deformities. No edema.  Skin:  No rashes, cyanosis, or lesions.   LABS:  CBC  Recent Labs Lab 01/17/15 1840 01/18/15 0500 01/18/15 1800  WBC 25.9* 21.3* 20.6*  HGB 7.6* 7.6* 7.7*  HCT 25.1* 23.0* 23.3*  PLT 392 321 310   Coag's  Recent Labs Lab 01/17/15 1840 01/18/15 0500 01/19/15 0330  APTT 34  --   --   INR 4.28* 6.14* 3.43*   BMET  Recent Labs Lab 01/18/15 0500 01/18/15 0920 01/18/15 1800  NA 146* 148* 142  K 3.4* 4.0 3.4*  CL 119* 123* 116*  CO2 18* 21 19  BUN 27* 23 16  CREATININE 1.39* 1.26* 1.07  GLUCOSE 205* 72 197*   Electrolytes  Recent Labs Lab 01/18/15 0500 01/18/15 0920 01/18/15 1800  CALCIUM 7.6* 7.7* 7.4*  MG 1.7  --   --   PHOS 1.5*  --   --    Sepsis Markers  Recent Labs Lab 01/17/15 1545 01/17/15 1840 01/17/15 2056 01/18/15 0500 01/19/15 0420  LATICACIDVEN 11.15* 6.1* 3.1*  --   --   PROCALCITON  --  3.86  --  3.64 1.81    Liver Enzymes  Recent Labs Lab 01/17/15 1840  AST 19  ALT 17  ALKPHOS 75  BILITOT 1.7*  ALBUMIN 2.7*   Cardiac Enzymes  Recent Labs Lab 01/18/15 1530 01/18/15 2130 01/19/15 0330  TROPONINI 0.97* 0.73* 0.53*   Glucose  Recent Labs Lab  01/18/15 0959 01/18/15 1148 01/18/15 1527 01/18/15 1917 01/18/15 2337 01/19/15 0359  GLUCAP 110* 147* 159* 191* 168* 110*     ASSESSMENT / PLAN:  PULMONARY A: No acute issues P:   Supplemental O2, keep SpO2 >92% Clinical progress, no abg repeat needed  CARDIOVASCULAR CVL RIJ 2/29 >>> A:  Shock; resolving H/o CAD s/p PCI 11/2014. Troponins peaked at 1.00 AS s/p mechanical AVR; INR supratherapeutic; improved after Vit K HTN HLD NONAG acidosis P:  Levophed gtt discontinued at midnight, goal MAP >60 Goal CVP 8-10 - dc Discontinue D5 1/2 NS Avoid saline Repeat ECHO for veg, may need TEE Reassess aortic valve fxn, has low diastolic, at risk AI Coumadin per pharmacy ASA +  Plavix, low threshold to hold, inr noted Statin  RENAL A:   AKI, resolved Hypokalemia Hypophosphatemia NONAG acidosis from saline, r/o RTA 4 P:   BMP in am  Avoid saline 1/2 NS, kvo  GASTROINTESTINAL A:   GERD GI PPx P:   Pepcid SLP assessment >>> Dys 1 diet  HEMATOLOGIC A:   Anemia; Hb stable AS s/p AVR on Coumadin Supratherapeutic INR; improving (3.43). Given 1 mg IV Vit K yesterday DVT/PE PPx P:  Transfuse for Hb <7.0  Coumadin per pharmacy; goal INR 2.5 - 3.0 No further vit k   INFECTIOUS A:   Shock, unknown etiology, favor sepsis; resolving Procalcitonin 1.81 this AM R/o endocarditis P:   BCx2 2/29 >>> NTD UC 2/29 >>> NTD Abx: Vanco, start date 2/29 >>>3/1 Abx: Zosyn, start date 2/29 >>>3/1 Abx: Ceftriaxone 3/1>>> ECHO today; May need TEE  No staph, dc vanc Low suspcion endocarditis  ENDOCRINE A:   Severe DKA resolved, gap closed DM type II w/ h/o non-compliance   P:   Lantus 20 units + ISS  D/c IVF as above  Dys 1 diet; advance per SLP  NEUROLOGIC A:   Acute Metabolic Encephalopathy; significantly improved P:   Hold Baclofen, Ambien, Lexapro  TODAY'S SUMMARY: CBG's stable, mental status improved. ECHO pending for IE assessment. Blood cultures NTD.    Lauris Chroman, MD PGY-2 Internal Medicine Pager: [phone]  STAFF NOTE: Cindi Carbon, MD FACP have personally reviewed patient's available data, including medical history, events of note, physical examination and test results as part of my evaluation. I have discussed with resident/NP and other care providers such as pharmacist, RN and RRT. In addition, I personally evaluated patient and elicited key findings of: major clinical progress,m off pressors, dc vanc no staph and low suspcion infection, echo awaited , advancce diet, avoid saline To triad, tele Mcarthur Rossetti. Tyson Alias, MD, FACP Pgr: [phone] [facility] 01/19/2015 11:02 AM

## 2015-01-19 NOTE — Progress Notes (Signed)
Notified Dr. Arsenio Loader that pt requesting Ambien. Pt states she takes at home. MD order Ambien 5mg  po x1. Will continue to monitor pt. , RN

## 2015-01-19 NOTE — Progress Notes (Signed)
Sister called and updated with new room assignment.

## 2015-01-19 NOTE — Progress Notes (Addendum)
Inpatient Diabetes Program Recommendations  AACE/ADA: New Consensus Statement on Inpatient Glycemic Control (2013)  Target Ranges:  Prepandial:   less than 140 mg/dL      Peak postprandial:   less than 180 mg/dL (1-2 hours)      Critically ill patients:  140 - 180 mg/dL    Results for Madison [name], Madison [name] (MRN [id number]) as of 01/19/2015 10:43  Ref. Range 01/19/2015 08:39  Glucose-Capillary Latest Range: 70-99 mg/dL 016 (H)    Diabetes history: DM 2 Outpatient Diabetes medications: Levemir 25 units BID, Humalog 6 units TID Current orders for Inpatient glycemic control: Lantus 20 units Daily, Novolog 0-9 units Q4hrs  Inpatient Diabetes Program Recommendations Insulin - Basal: Patient's fasting glucose this am 294 mg/dl. Patients home dose of basal is Levemir 25 units BID. Please consider increasing basal insulin to twice a day (Lantus 20 units BID).  Consult Note, 1248pm: Received consult for insulin management for discharge. Patient sees Dr. Everardo All (Endocrinologist) as an outpatient for DM management and takes Levemir 25 units BID, Humalog 6 units TID at home. For discharge, patient needs to be placed back on home regimen and see Dr. Everardo All for follow up appointment.   Thanks,  Christena Deem RN, MSN, [facility] Inpatient Diabetes Coordinator Team Pager [phone]

## 2015-01-19 NOTE — Progress Notes (Signed)
Attempted to call report to nurse on 5W. Awaiting call back.

## 2015-01-19 NOTE — Progress Notes (Signed)
Pt arrived to the unit at 1345. Alert and oriented x4. Oriented to room and equipment. Denies any pain or discomfort.

## 2015-01-19 NOTE — Progress Notes (Signed)
Notified MD oncall that pt having heartburn. MD ordered medication. Will continue to monitor. Nelda Marseille, RN

## 2015-01-19 NOTE — Progress Notes (Signed)
Pt tolerating dys I, nectar thick liquid diet. Dislikes the taste of milk and orange juice,states it burns a little when swallowing.  No coughing. Eating magic cup ice cream without problem.

## 2015-01-19 NOTE — Progress Notes (Signed)
Speech Language Pathology Treatment: Dysphagia  Patient Details Name: Madison [name] MRN: [id number] DOB: 1961/09/08 Today's Date: 01/19/2015 Time: 9977-4142 SLP Time Calculation (min) (ACUTE ONLY): 8 min  Assessment / Plan / Recommendation Clinical Impression  F/u after yesterday's swallow assessment.  Pt transferred to floor; MS much improved with resolution of dysphagia.  Consumed dysphagia 3 textures (per pt's preference given absence of upper teeth) and thin liquids with adequate attention to and mastication of boluses, no s/s of aspiration.  No cues required. Recommend advancing diet to dysphagia 3, thin liquids.  No SLP f/u warranted.  Pt in agreement.    HPI HPI: 54 y.o. with history of CAD, DM, HTN, stroke syndrome admitted after being found pt face up, lying on floor, only responsive to pain. Found to be in severe  DKA and hypotensive. CT revealed chronic bilateral infarcts, atrophy, and extensive white matter microvascular ischemic changes.CXR 2/29 No active disease. Pt has had multiple bedside swallows with recommendations for regular and Dys 3 textures and thin liquids.    Pertinent Vitals Pain Assessment: No/denies pain  SLP Plan  All goals met    Recommendations Diet recommendations: Dysphagia 3 (mechanical soft);Thin liquid Liquids provided via: Straw Medication Administration: Whole meds with liquid Supervision: Patient able to self feed Postural Changes and/or Swallow Maneuvers: Seated upright 90 degrees;Upright 30-60 min after meal              Oral Care Recommendations: Oral care BID Follow up Recommendations: None Plan: All goals met   Tracye Szuch L. Tivis Ringer, [place] CCC/SLP Pager [phone]      Juan Quam Laurice 01/19/2015, 3:32 PM

## 2015-01-19 NOTE — Progress Notes (Signed)
  Echocardiogram 2D Echocardiogram has been performed.  Leta Jungling M 01/19/2015, 12:54 PM

## 2015-01-19 NOTE — Progress Notes (Signed)
Met with the patient regarding the continuation of [facility] Care Management services after discharge.  Patient states she is planning to return home and that her telephone is currently off.  She is still interested in [facility] Management services.  Inpatient RNCM made aware of [facility] Care Management involement.  [facility] Management services does not replace or interfere with any services arranged by the inpatient care management team.  For questions, please contact Natividad Brood, RN, BSN, [facility] Liaison Nurse at [phone].

## 2015-01-19 NOTE — Progress Notes (Signed)
eLink Physician-Brief Progress Note Patient Name: Madison [name] DOB: 08-10-1961 MRN: [id number]   Date of Service  01/19/2015  HPI/Events of Note  Called with multiple issues: 1. Patient c/o heartburn, 2. Patient still has Norepinephrine ordered, and 3. Patient is on D5 0.45 NaCl at 100 mL/hour and blood glucose = 214. Patient is on a diet and is eating and drinking.  eICU Interventions  Will order: 1. Mylanta 30 mL PO Q 4 hours PRN 2. D/C Norepinephrine IV infusion 3. D/C D5 0.45 NS     Intervention Category Minor Interventions: Routine modifications to care plan (e.g. PRN medications for pain, fever)  Sommer,Steven Eugene 01/19/2015, 8:24 PM

## 2015-01-19 NOTE — Progress Notes (Signed)
ANTICOAGULATION CONSULT NOTE - Follow Up Consult  Pharmacy Consult for warfarin Indication: mechanical AVR, hx CVA  Allergies  Allergen Reactions  . Celebrex [Celecoxib] Rash  . Detrol [Tolterodine] Hives    Patient Measurements: Weight: 168 lb 14 oz (76.6 kg) Heparin Dosing Weight:   Vital Signs: Temp: 98 F (36.7 C) (03/02 1209) Temp Source: Oral (03/02 1209) BP: 96/33 mmHg (03/02 1200) Pulse Rate: 94 (03/02 1200)  Labs:  Recent Labs  01/17/15 1840  01/18/15 0500 01/18/15 0920 01/18/15 1530 01/18/15 1800 01/18/15 2130 01/19/15 0330 01/19/15 0755  HGB 7.6*  --  7.6*  --   --  7.7*  --   --  7.4*  HCT 25.1*  --  23.0*  --   --  23.3*  --   --  23.3*  PLT 392  --  321  --   --  310  --   --  242  APTT 34  --   --   --   --   --   --   --   --   LABPROT 41.4*  --  54.9*  --   --   --   --  34.9*  --   INR 4.28*  --  6.14*  --   --   --   --  3.43*  --   CREATININE 2.58*  < > 1.39* 1.26*  --  1.07  --   --  0.87  TROPONINI 0.04*  < >  --  1.00* 0.97*  --  0.73* 0.53*  --   < > = values in this interval not displayed.  Estimated Creatinine Clearance: 72.5 mL/min (by C-G formula based on Cr of 0.87).  Assessment: 54 yof on chronic coumadin for hx mechanical AVR continues on coumadin for anticoagulation.   INR down to 3.43 after vitamin K IV 1mg .  Hgb 7.6>>7.4, plts 242, no bleeding noted  Patient currently on pureed diet with poor po intake.  Home dose: warfarin 7.5mg  on Wednesdays, and 5mg  all other days.   Goal of Therapy:  INR 2.5-3.5 Monitor platelets by anticoagulation protocol: Yes   Plan:  1. Coumadin 2 mg po x1 today 2. F/u AM INR  12-13-1992, PharmD, BCPS Clinical Pharmacist [phone] 01/19/2015,12:59 PM

## 2015-01-19 NOTE — Progress Notes (Signed)
eLink Physician-Brief Progress Note Patient Name: Madison [name] DOB: 27-Mar-1961 MRN: [id number]   Date of Service  01/19/2015  HPI/Events of Note  Called d/t patient request for home Ambien.  eICU Interventions  Will order Ambien 5 mg PO at HS tonight. Primary to assess continuing Ambien and appropriate dose in AM.      Intervention Category Minor Interventions: Routine modifications to care plan (e.g. PRN medications for pain, fever)  Ares Tegtmeyer Eugene 01/19/2015, 11:05 PM

## 2015-01-20 ENCOUNTER — Encounter: Payer: Self-pay | Admitting: Cardiovascular Disease

## 2015-01-20 DIAGNOSIS — I10 Essential (primary) hypertension: Secondary | ICD-10-CM

## 2015-01-20 DIAGNOSIS — N179 Acute kidney failure, unspecified: Secondary | ICD-10-CM

## 2015-01-20 DIAGNOSIS — G9341 Metabolic encephalopathy: Secondary | ICD-10-CM

## 2015-01-20 LAB — GLUCOSE, CAPILLARY
GLUCOSE-CAPILLARY: 133 mg/dL — AB (ref 70–99)
GLUCOSE-CAPILLARY: 239 mg/dL — AB (ref 70–99)
GLUCOSE-CAPILLARY: 59 mg/dL — AB (ref 70–99)
GLUCOSE-CAPILLARY: 88 mg/dL (ref 70–99)
Glucose-Capillary: 107 mg/dL — ABNORMAL HIGH (ref 70–99)
Glucose-Capillary: 93 mg/dL (ref 70–99)
Glucose-Capillary: 73 mg/dL (ref 70–99)

## 2015-01-20 LAB — CBC
HCT: 27.3 % — ABNORMAL LOW (ref 36.0–46.0)
HEMOGLOBIN: 8.5 g/dL — AB (ref 12.0–15.0)
MCH: 26 pg (ref 26.0–34.0)
MCHC: 31.1 g/dL (ref 30.0–36.0)
MCV: 83.5 fL (ref 78.0–100.0)
Platelets: 232 10*3/uL (ref 150–400)
RBC: 3.27 MIL/uL — ABNORMAL LOW (ref 3.87–5.11)
RDW: 17.9 % — ABNORMAL HIGH (ref 11.5–15.5)
WBC: 7.6 10*3/uL (ref 4.0–10.5)

## 2015-01-20 LAB — BASIC METABOLIC PANEL
Anion gap: 4 — ABNORMAL LOW (ref 5–15)
CO2: 25 mmol/L (ref 19–32)
CREATININE: 0.76 mg/dL (ref 0.50–1.10)
Calcium: 8.3 mg/dL — ABNORMAL LOW (ref 8.4–10.5)
Chloride: 112 mmol/L (ref 96–112)
GFR calc Af Amer: >90 mL/min (ref >90)
GFR calc non Af Amer: >90 mL/min (ref >90)
GLUCOSE: 76 mg/dL (ref 70–99)
Potassium: 3.9 mmol/L (ref 3.5–5.1)
Sodium: 141 mmol/L (ref 135–145)

## 2015-01-20 LAB — PROTIME-INR
INR: 2 — ABNORMAL HIGH (ref 0.00–1.49)
Prothrombin Time: 22.8 seconds — ABNORMAL HIGH (ref 11.6–15.2)

## 2015-01-20 LAB — HEPARIN LEVEL (UNFRACTIONATED): Heparin Unfractionated: 0.37 IU/mL (ref 0.30–0.70)

## 2015-01-20 MED ORDER — INSULIN DETEMIR 100 UNIT/ML ~~LOC~~ SOLN
25.0000 [IU] | Freq: Two times a day (BID) | SUBCUTANEOUS | Status: DC
  Administered 2015-01-20 – 2015-01-21 (×2): 25 [IU] via SUBCUTANEOUS
  Filled 2015-01-20 (×4): qty 0.25

## 2015-01-20 MED ORDER — INSULIN ASPART 100 UNIT/ML ~~LOC~~ SOLN: 0.0000 [IU] | Freq: Every day | SUBCUTANEOUS | Status: DC

## 2015-01-20 MED ORDER — INSULIN ASPART 100 UNIT/ML ~~LOC~~ SOLN: 0.0000 [IU] | Freq: Three times a day (TID) | SUBCUTANEOUS | Status: DC

## 2015-01-20 MED ORDER — INSULIN ASPART 100 UNIT/ML ~~LOC~~ SOLN
0.0000 [IU] | Freq: Three times a day (TID) | SUBCUTANEOUS | Status: DC
  Administered 2015-01-20: 5 [IU] via SUBCUTANEOUS
  Administered 2015-01-21: 8 [IU] via SUBCUTANEOUS
  Administered 2015-01-22: 5 [IU] via SUBCUTANEOUS
  Administered 2015-01-22: 3 [IU] via SUBCUTANEOUS
  Administered 2015-01-23: 15 [IU] via SUBCUTANEOUS
  Administered 2015-01-23 (×2): 3 [IU] via SUBCUTANEOUS
  Administered 2015-01-24: 8 [IU] via SUBCUTANEOUS
  Administered 2015-01-24: 3 [IU] via SUBCUTANEOUS

## 2015-01-20 MED ORDER — ZOLPIDEM TARTRATE 5 MG PO TABS
5.0000 mg | ORAL_TABLET | Freq: Every evening | ORAL | Status: DC | PRN
  Administered 2015-01-20 – 2015-01-21 (×2): 5 mg via ORAL
  Filled 2015-01-20 (×2): qty 1

## 2015-01-20 MED ORDER — FAMOTIDINE 20 MG PO TABS
20.0000 mg | ORAL_TABLET | Freq: Every day | ORAL | Status: DC
  Administered 2015-01-20 – 2015-01-24 (×5): 20 mg via ORAL
  Filled 2015-01-20 (×5): qty 1

## 2015-01-20 MED ORDER — HEPARIN (PORCINE) IN NACL 100-0.45 UNIT/ML-% IJ SOLN
1000.0000 [IU]/h | INTRAMUSCULAR | Status: DC
  Administered 2015-01-20 – 2015-01-22 (×3): 1000 [IU]/h via INTRAVENOUS
  Filled 2015-01-20 (×4): qty 250

## 2015-01-20 MED ORDER — INSULIN ASPART 100 UNIT/ML ~~LOC~~ SOLN
6.0000 [IU] | Freq: Three times a day (TID) | SUBCUTANEOUS | Status: DC
  Administered 2015-01-21: 6 [IU] via SUBCUTANEOUS

## 2015-01-20 MED ORDER — WARFARIN SODIUM 5 MG PO TABS
5.0000 mg | ORAL_TABLET | Freq: Once | ORAL | Status: AC
  Administered 2015-01-20: 5 mg via ORAL
  Filled 2015-01-20: qty 1

## 2015-01-20 NOTE — Progress Notes (Signed)
Notified Claiborne Billings, NP that pt's CBG tonight was 59. Gave snack. NP gave order to hold tonight's dose of levemir. WIll continue to monitor pt. Nelda Marseille, RN

## 2015-01-20 NOTE — Progress Notes (Addendum)
Came to follow up with patient for better contact numbers for her when she discharges. Patient's home phone has been disconnected and [facility] staff has been unable to reach her from last hospital discharge. Patient states her home phone will be turned back on soon. However, in the meantime patient reports her cousins are supposed to live with her at discharge. Unfortunately, Ms Sholtz still does not have her cousins' phone numbers for [facility] to contact. Writer left contact number for her to have cousin Inda Merlin and Osvaldo Shipper) call with contact information. Will continue to follow. [facility] consent updated with cousins' names as emergency contacts. Will await for contact numbers. Raiford Noble, MSN-RN,BSN- [facility] [phone]

## 2015-01-20 NOTE — Progress Notes (Signed)
Notified Claiborne Billings, NP that pt requesting Ambien. Pt takes Ambien at home. NP put in order for Ambien. Will continue to monitor pt. Nelda Marseille, RN

## 2015-01-20 NOTE — Progress Notes (Addendum)
ANTICOAGULATION CONSULT NOTE - Initial Consult  Pharmacy Consult for Heparin >> Coumadin Indication: mechanical AVR  Allergies  Allergen Reactions  . Celebrex [Celecoxib] Rash  . Detrol [Tolterodine] Hives    Patient Measurements: Height: 5\' 3"  (160 cm) Weight: 170 lb 3.1 oz (77.2 kg) IBW/kg (Calculated) : 52.4 Heparin Dosing Weight: 69 kg  Vital Signs: Temp: 98.9 F (37.2 C) (03/03 0600) Temp Source: Oral (03/03 0600) BP: 141/55 mmHg (03/03 0600) Pulse Rate: 76 (03/03 0600)  Labs:  Recent Labs  01/17/15 1840  01/18/15 0500  01/18/15 1530 01/18/15 1800 01/18/15 2130 01/19/15 0330 01/19/15 0755 01/20/15 0515  HGB 7.6*  --  7.6*  --   --  7.7*  --   --  7.4* 8.5*  HCT 25.1*  --  23.0*  --   --  23.3*  --   --  23.3* 27.3*  PLT 392  --  321  --   --  310  --   --  242 232  APTT 34  --   --   --   --   --   --   --   --   --   LABPROT 41.4*  --  54.9*  --   --   --   --  34.9*  --  22.8*  INR 4.28*  --  6.14*  --   --   --   --  3.43*  --  2.00*  CREATININE 2.58*  < > 1.39*  < >  --  1.07  --   --  0.87 0.76  TROPONINI 0.04*  < >  --   < > 0.97*  --  0.73* 0.53*  --   --   < > = values in this interval not displayed.  Estimated Creatinine Clearance: 79.1 mL/min (by C-G formula based on Cr of 0.76).   Medical History: Past Medical History  Diagnosis Date  . CAD (coronary artery disease)   . Obesity   . Hypercholesteremia   . HTN (hypertension)   . Dyslipidemia   . Aortic stenosis   . Thrombophlebitis   . Heart murmur   . Myocardial infarction 12/2014  . Pneumonia 11/2014; 12/2014  . Diabetes dx'd 1982    "went straight to insulin"  . GERD (gastroesophageal reflux disease)   . 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 Rheumatoid arthritis(714.0)     "hands" (01/19/2015)  . Anxiety   . Depression     Medications:  Scheduled:  . antiseptic oral rinse  7 mL Mouth Rinse q12n4p  . aspirin  81  mg Oral Daily  . cefTRIAXone (ROCEPHIN)  IV  2 g Intravenous Q24H  . chlorhexidine  15 mL Mouth Rinse BID  . clopidogrel  75 mg Oral Q breakfast  . famotidine  20 mg Oral Daily  . insulin aspart  0-9 Units Subcutaneous 6 times per day  . insulin glargine  20 Units Subcutaneous BID  . metoCLOPramide (REGLAN) injection  5 mg Intravenous 3 times per day  . pravastatin  20 mg Oral Daily  . Warfarin - Pharmacist Dosing Inpatient   Does not apply q1800    Assessment: 54 yo F on Coumadin PTA for hx mechanical AVR.  Pt was admitted after being found down by EMS thought 2/2 DKA.  INR 6.14 on 3/1 and pt received 1mg  IV Vit K.  Today INR has decreased to <  2.5 and pt needs to begin bridge therapy with heparin.  Hgb remains low but improved from baseline. No bleeding noted.  Home Coumadin dose = 5mg  daily except 7.5mg  on Wednesdays  Goal of Therapy:  Heparin level 0.3-0.7 units/ml INR 2.5-3.5 Monitor platelets by anticoagulation protocol: Yes   Plan:  Start Heparin 1000 units/hr Heparin level in 8 hours. Coumadin 5mg  PO x 1 tonight Heparin level, CBC, and INR daily  12-13-1992, Pharm.D., BCPS Clinical Pharmacist Pager [phone] 01/20/2015 11:07 AM    Addum:  Heparin level 0.37 units/ml.  Will cont same and f/u am labs [phone], PharmD

## 2015-01-20 NOTE — Progress Notes (Signed)
[facility] PROGRESS NOTE  Madison [name] MVH:[id number] DOB: 1961-03-14 DOA: 01/17/2015 PCP: Kristian Covey, MD  Assessment/Plan: 1. Hemodynamic shock 1. Resolved 2. Off pressors 3. S/p aggressive IVF hydration 2. ARF 1. Resolved 2. Renal function normalized 3. Anemia 1. hgb stable 2. Continue to follow CBC 4. Supertherapeutic INR 1. Pt was given vit K 2 days ago for INR of 6.14 2. Coumadin resumed on 3/2 3. INR subtherapeutic at under 2.5. Heparin gtt started to bridge until INR becomes therapeutic 5. Hx mechanical AVR 1. On therapeutic INR 6. HTN 1. BP stable and controlled 7. HLD 1. Cont pravastatin 8. DKA 1. Now off insulin gtt 2. Home insulin regimen continued 3. SSI coverage as needed 9. Encephalopathy 1. Resolved 10. DVT prophylaxis 1. On therapeutic anticoagulation  Code Status: Full Family Communication: Pt in room (indicate person spoken with, relationship, and if by phone, the number) Disposition Plan: Pending   Consultants:  Critical Care  Procedures:   Antibiotics: Abx: Vanco, start date 2/29 >>>3/1 Abx: Zosyn, start date 2/29 >>>3/1 Abx: Ceftriaxone 3/1>>> 3/3  HPI/Subjective: Feels better today. Eager to go home  Objective: Filed Vitals:   01/19/15 2148 01/20/15 0002 01/20/15 0600 01/20/15 1339  BP: 127/53  141/55 118/36  Pulse: 86  76 77  Temp: 99.2 F (37.3 C)  98.9 F (37.2 C) 99.2 F (37.3 C)  TempSrc: Oral  Oral Axillary  Resp: 22  20 20   Height:  5\' 3"  (1.6 m)    Weight:  77.2 kg (170 lb 3.1 oz)    SpO2: 100%  99% 100%    Intake/Output Summary (Last 24 hours) at 01/20/15 1736 Last data filed at 01/20/15 1436  Gross per 24 hour  Intake 1520.17 ml  Output   2050 ml  Net -529.83 ml   Filed Weights   01/18/15 0500 01/20/15 0002  Weight: 76.6 kg (168 lb 14 oz) 77.2 kg (170 lb 3.1 oz)    Exam:   General:  Awake, in nad  Cardiovascular: regular, s1, s2  Respiratory: normal resp effort, no  wheezing  Abdomen: soft, nondistended  Musculoskeletal: perfused, no clubbing   Data Reviewed: Basic Metabolic Panel:  Recent Labs Lab 01/18/15 0500 01/18/15 0920 01/18/15 1800 01/19/15 0755 01/20/15 0515  NA 146* 148* 142 138 141  K 3.4* 4.0 3.4* 4.5 3.9  CL 119* 123* 116* 113* 112  CO2 18* 21 19 19 25   GLUCOSE 205* 72 197* 294* 76  BUN 27* 23 16 8  <5*  CREATININE 1.39* 1.26* 1.07 0.87 0.76  CALCIUM 7.6* 7.7* 7.4* 7.5* 8.3*  MG 1.7  --   --   --   --   PHOS 1.5*  --   --   --   --    Liver Function Tests:  Recent Labs Lab 01/17/15 1840  AST 19  ALT 17  ALKPHOS 75  BILITOT 1.7*  PROT 5.2*  ALBUMIN 2.7*    Recent Labs Lab 01/18/15 0920  LIPASE 37   No results for input(s): AMMONIA in the last 168 hours. CBC:  Recent Labs Lab 01/17/15 1502 01/17/15 1840 01/18/15 0500 01/18/15 1800 01/19/15 0755 01/20/15 0515  WBC 27.5* 25.9* 21.3* 20.6* 12.4* 7.6  NEUTROABS 23.6*  --   --   --   --   --   HGB 9.3* 7.6* 7.6* 7.7* 7.4* 8.5*  HCT 33.2* 25.1* 23.0* 23.3* 23.3* 27.3*  MCV 93.0 86.6 79.3 78.7 80.9 83.5  PLT 425* 392 321 310 242 232  Cardiac Enzymes:  Recent Labs Lab 01/18/15 0100 01/18/15 0920 01/18/15 1530 01/18/15 2130 01/19/15 0330  TROPONINI 0.39* 1.00* 0.97* 0.73* 0.53*   BNP (last 3 results) No results for input(s): BNP in the last 8760 hours.  ProBNP (last 3 results)  Recent Labs  09/30/14 1431  PROBNP 794.3*    CBG:  Recent Labs Lab 01/20/15 0018 01/20/15 0356 01/20/15 0802 01/20/15 1202 01/20/15 1634  GLUCAP 133* 88 107* 239* 93    Recent Results (from the past 240 hour(s))  Blood culture (routine x 2)     Status: None (Preliminary result)   Collection Time: 01/17/15  3:39 PM  Result Value Ref Range Status   Specimen Description BLOOD RIGHT WRIST  Final   Special Requests BOTTLES DRAWN AEROBIC ONLY 1CC  Final   Culture   Final           BLOOD CULTURE RECEIVED NO GROWTH TO DATE CULTURE WILL BE HELD FOR 5 DAYS  BEFORE ISSUING A FINAL NEGATIVE REPORT Performed at [facility]    Report Status PENDING  Incomplete  Blood culture (routine x 2)     Status: None (Preliminary result)   Collection Time: 01/17/15  3:45 PM  Result Value Ref Range Status   Specimen Description BLOOD ARM RIGHT  Final   Special Requests   Final    BOTTLES DRAWN AEROBIC AND ANAEROBIC 5CCBLUE 2.5CCRED   Culture   Final           BLOOD CULTURE RECEIVED NO GROWTH TO DATE CULTURE WILL BE HELD FOR 5 DAYS BEFORE ISSUING A FINAL NEGATIVE REPORT Performed at [facility]    Report Status PENDING  Incomplete  Urine culture     Status: None   Collection Time: 01/17/15  6:27 PM  Result Value Ref Range Status   Specimen Description URINE, CATHETERIZED  Final   Special Requests NONE  Final   Colony Count NO GROWTH Performed at [facility]   Final   Culture NO GROWTH Performed at [facility]   Final   Report Status 01/19/2015 FINAL  Final  MRSA PCR Screening     Status: None   Collection Time: 01/17/15  7:51 PM  Result Value Ref Range Status   MRSA by PCR NEGATIVE NEGATIVE Final    Comment:        The GeneXpert MRSA Assay (FDA approved for NASAL specimens only), is one component of a comprehensive MRSA colonization surveillance program. It is not intended to diagnose MRSA infection nor to guide or monitor treatment for MRSA infections.      Studies: Dg Chest Port 1 View  01/19/2015   CLINICAL DATA:  Fever  EXAM: PORTABLE CHEST - 1 VIEW  COMPARISON:  Portable chest x-ray of 01/17/2015  FINDINGS: No pneumonia is seen. A right IJ central venous line is present with the tip overlying the expected SVC -RA junction. No pneumothorax is seen. Cardiomegaly is stable.  IMPRESSION: 1. No active cardiopulmonary disease. 2. Right IJ central venous catheter overlies the expected SVC -RA junction.   Electronically Signed   By: Dwyane Dee M.D.   On: 01/19/2015 07:19    Scheduled Meds: . antiseptic  oral rinse  7 mL Mouth Rinse q12n4p  . aspirin  81 mg Oral Daily  . chlorhexidine  15 mL Mouth Rinse BID  . clopidogrel  75 mg Oral Q breakfast  . famotidine  20 mg Oral Daily  . insulin aspart  0-15 Units Subcutaneous TID  WC  . insulin aspart  0-5 Units Subcutaneous QHS  . insulin aspart  6 Units Subcutaneous TID WC  . insulin detemir  25 Units Subcutaneous BID  . pravastatin  20 mg Oral Daily  . warfarin  5 mg Oral ONCE-1800  . Warfarin - Pharmacist Dosing Inpatient   Does not apply q1800   Continuous Infusions: . sodium chloride 10 mL/hr at 01/19/15 2030  . heparin 1,000 Units/hr (01/20/15 1157)    Active Problems:   DKA (diabetic ketoacidoses)   Diabetic ketoacidosis with coma associated with other specified diabetes mellitus   Fever    Yoland Scherr K  [facility] Pager [phone]. If 7PM-7AM, please contact night-coverage at [email or web address], password [email or web address] 01/20/2015, 5:36 PM  LOS: 3 days

## 2015-01-20 NOTE — Progress Notes (Signed)
Hypoglycemic Event  CBG: 59  Treatment: 15 GM carbohydrate snack  Symptoms: None  Follow-up CBG: Time:2206 CBG Result:73  Possible Reasons for Event: Unknown  Comments/MD notified:Notified Claiborne Billings, NP. No new orders given    Madison [name], Madison [name]  Remember to initiate Hypoglycemia Order Set & complete

## 2015-01-21 DIAGNOSIS — D508 Other iron deficiency anemias: Secondary | ICD-10-CM

## 2015-01-21 LAB — PROTIME-INR
INR: 1.68 — ABNORMAL HIGH (ref 0.00–1.49)
PROTHROMBIN TIME: 20 s — AB (ref 11.6–15.2)

## 2015-01-21 LAB — GLUCOSE, CAPILLARY
GLUCOSE-CAPILLARY: 91 mg/dL (ref 70–99)
GLUCOSE-CAPILLARY: 38 mg/dL — AB (ref 70–99)
GLUCOSE-CAPILLARY: 109 mg/dL — AB (ref 70–99)
Glucose-Capillary: 18 mg/dL — CL (ref 70–99)
Glucose-Capillary: 150 mg/dL — ABNORMAL HIGH (ref 70–99)
Glucose-Capillary: 126 mg/dL — ABNORMAL HIGH (ref 70–99)
Glucose-Capillary: 18 mg/dL — CL (ref 70–99)
Glucose-Capillary: 62 mg/dL — ABNORMAL LOW (ref 70–99)
Glucose-Capillary: 68 mg/dL — ABNORMAL LOW (ref 70–99)
Glucose-Capillary: 167 mg/dL — ABNORMAL HIGH (ref 70–99)
Glucose-Capillary: 266 mg/dL — ABNORMAL HIGH (ref 70–99)
Glucose-Capillary: 13 mg/dL — CL (ref 70–99)
Glucose-Capillary: 31 mg/dL — CL (ref 70–99)
Glucose-Capillary: 32 mg/dL — CL (ref 70–99)

## 2015-01-21 LAB — CBC
HCT: 31.2 % — ABNORMAL LOW (ref 36.0–46.0)
HEMOGLOBIN: 9.7 g/dL — AB (ref 12.0–15.0)
MCH: 25.6 pg — ABNORMAL LOW (ref 26.0–34.0)
MCHC: 31.1 g/dL (ref 30.0–36.0)
MCV: 82.3 fL (ref 78.0–100.0)
PLATELETS: 213 10*3/uL (ref 150–400)
RBC: 3.79 MIL/uL — ABNORMAL LOW (ref 3.87–5.11)
RDW: 17.9 % — AB (ref 11.5–15.5)
WBC: 6.5 10*3/uL (ref 4.0–10.5)

## 2015-01-21 LAB — BASIC METABOLIC PANEL
Anion gap: 10 (ref 5–15)
BUN: <5 mg/dL — ABNORMAL LOW (ref 6–23)
CO2: 20 mmol/L (ref 19–32)
Calcium: 8.7 mg/dL (ref 8.4–10.5)
Chloride: 114 mmol/L — ABNORMAL HIGH (ref 96–112)
Creatinine, Ser: 0.63 mg/dL (ref 0.50–1.10)
GFR calc Af Amer: >90 mL/min (ref >90)
Glucose, Bld: 29 mg/dL — CL (ref 70–99)
POTASSIUM: 3.9 mmol/L (ref 3.5–5.1)
SODIUM: 144 mmol/L (ref 135–145)

## 2015-01-21 LAB — HEPARIN LEVEL (UNFRACTIONATED): HEPARIN UNFRACTIONATED: 0.37 [IU]/mL (ref 0.30–0.70)

## 2015-01-21 MED ORDER — WARFARIN SODIUM 7.5 MG PO TABS
7.5000 mg | ORAL_TABLET | Freq: Once | ORAL | Status: AC
  Administered 2015-01-21: 7.5 mg via ORAL
  Filled 2015-01-21: qty 1

## 2015-01-21 MED ORDER — INSULIN DETEMIR 100 UNIT/ML ~~LOC~~ SOLN
15.0000 [IU] | Freq: Two times a day (BID) | SUBCUTANEOUS | Status: DC
  Administered 2015-01-21 – 2015-01-22 (×2): 15 [IU] via SUBCUTANEOUS
  Filled 2015-01-21 (×3): qty 0.15

## 2015-01-21 MED ORDER — DEXTROSE 50 % IV SOLN
INTRAVENOUS | Status: AC
  Administered 2015-01-21: 50 mL
  Filled 2015-01-21: qty 50

## 2015-01-21 MED ORDER — DEXTROSE-NACL 5-0.9 % IV SOLN
INTRAVENOUS | Status: DC
  Administered 2015-01-21: 75 mL/h via INTRAVENOUS

## 2015-01-21 MED ORDER — DEXTROSE 10 % IV SOLN
INTRAVENOUS | Status: DC
  Administered 2015-01-21: 75 mL/h via INTRAVENOUS
  Administered 2015-01-22: 09:00:00 via INTRAVENOUS

## 2015-01-21 MED ORDER — DEXTROSE-NACL 5-0.9 % IV SOLN
INTRAVENOUS | Status: DC
  Administered 2015-01-21: 03:00:00 via INTRAVENOUS

## 2015-01-21 MED ORDER — DEXTROSE 10 % IV SOLN
INTRAVENOUS | Status: DC
Start: 2015-01-21 — End: 2015-01-21
  Administered 2015-01-21: 75 mL/h via INTRAVENOUS

## 2015-01-21 NOTE — Care Management Note (Unsigned)
    Page 1 of 1   01/21/2015     11:35:51 AM CARE MANAGEMENT NOTE 01/21/2015  Patient:  Madison [name], Madison [name]   Account Number:  [id number]  Date Initiated:  01/18/2015  Documentation initiated by:  [facility]  Subjective/Objective Assessment:   Found down by [facility].  DKA, sepsis.  Admited to ICU on pressors.     Action/Plan:   Anticipated DC Date:  01/25/2015   Anticipated DC Plan:  SKILLED NURSING FACILITY      DC Planning Services  CM consult      Choice offered to / List presented to:             Status of service:  In process, will continue to follow Medicare Important Message given?  YES (If response is "NO", the following Medicare IM given date fields will be blank) Date Medicare IM given:  01/20/2015 Medicare IM given by:  Letha Cape Date Additional Medicare IM given:   Additional Medicare IM given by:    Discharge Disposition:    Per [facility] Regulation:  Reviewed for med. necessity/level of care/duration of stay  If discussed at Long Length of Stay Meetings, dates discussed:    Comments:  ContactWard Givens Sister [phone]  3/4/k16 1135 Letha Cape RN, BSN [phone] awaiting pt eval.   01-18-15 3pm Avie Arenas, RNBSN [phone] Lives at home alone has had [facility] HH with PT, OT, RN and SW. HH communicated to me that patient;s son no longer lives with her - and now lives without his supplement of 550.00 and cannot even afford food, let alone medications.  I suggested that is sound as if she needs a higher level of care - [facility] agrees with this.  Will try to get into a higher level - if does [facility] will need a resumption of HH orders.

## 2015-01-21 NOTE — Progress Notes (Signed)
[facility] PROGRESS NOTE  Madison [name] MBW:[id number] DOB: 07/08/1961 DOA: 01/17/2015 PCP: Kristian Covey, MD  Assessment/Plan: 1. Hemodynamic shock 1. Resolved 2. Remains off pressors 3. S/p aggressive IVF hydration 2. ARF 1. Resolved 2. Renal function normalized 3. Continue to monitor closely 3. Anemia 1. hgb stable 2. Continue to follow CBC 4. Supertherapeutic INR 1. Pt was given vit K recently for INR of 6.14 2. Coumadin resumed on 3/2 3. INR subtherapeutic and trending down at 1.6 today. Heparin gtt is continued to bridge until INR becomes therapeutic 5. Hx mechanical AVR 1. On therapeutic anticoagulation 6. HTN 1. BP stable and controlled 7. HLD 1. Cont pravastatin 8. DKA 1. Now off insulin gtt 2. Home insulin regimen was continued 3. SSI coverage as needed 4. Bouts of hypoglycemic episodes overnight, now improved with dextrose IVF 5. Wean dextrose as blood glucose tolerates 9. Encephalopathy 1. Resolved 10. DVT prophylaxis 1. On therapeutic anticoagulation  Code Status: Full Family Communication: Pt in room Disposition Plan: Pending   Consultants:  Critical Care  Procedures:   Antibiotics: Abx: Vanco, start date 2/29 >>>3/1 Abx: Zosyn, start date 2/29 >>>3/1 Abx: Ceftriaxone 3/1>>> 3/3  HPI/Subjective: Eager to go home. No complaints.  Objective: Filed Vitals:   01/20/15 1339 01/20/15 2136 01/21/15 0516 01/21/15 1312  BP: 118/36 144/63 137/49 147/68  Pulse: 77 102 71   Temp: 99.2 F (37.3 C) 97.7 F (36.5 C) 97.9 F (36.6 C) 98.9 F (37.2 C)  TempSrc: Axillary Oral Oral Oral  Resp: 20 18 18 16   Height:      Weight:      SpO2: 100% 100% 100% 100%    Intake/Output Summary (Last 24 hours) at 01/21/15 1532 Last data filed at 01/21/15 0900  Gross per 24 hour  Intake  871.5 ml  Output   2700 ml  Net -1828.5 ml   Filed Weights   01/18/15 0500 01/20/15 0002  Weight: 76.6 kg (168 lb 14 oz) 77.2 kg (170 lb 3.1 oz)     Exam:   General:  Awake, in nad  Cardiovascular: regular, s1, s2  Respiratory: normal resp effort, no wheezing  Abdomen: soft, nondistended  Musculoskeletal: perfused, no clubbing   Data Reviewed: Basic Metabolic Panel:  Recent Labs Lab 01/18/15 0500 01/18/15 0920 01/18/15 1800 01/19/15 0755 01/20/15 0515 01/21/15 0508  NA 146* 148* 142 138 141 144  K 3.4* 4.0 3.4* 4.5 3.9 3.9  CL 119* 123* 116* 113* 112 114*  CO2 18* 21 19 19 25 20   GLUCOSE 205* 72 197* 294* 76 29*  BUN 27* 23 16 8  <5* <5*  CREATININE 1.39* 1.26* 1.07 0.87 0.76 0.63  CALCIUM 7.6* 7.7* 7.4* 7.5* 8.3* 8.7  MG 1.7  --   --   --   --   --   PHOS 1.5*  --   --   --   --   --    Liver Function Tests:  Recent Labs Lab 01/17/15 1840  AST 19  ALT 17  ALKPHOS 75  BILITOT 1.7*  PROT 5.2*  ALBUMIN 2.7*    Recent Labs Lab 01/18/15 0920  LIPASE 37   No results for input(s): AMMONIA in the last 168 hours. CBC:  Recent Labs Lab 01/17/15 1502  01/18/15 0500 01/18/15 1800 01/19/15 0755 01/20/15 0515 01/21/15 0508  WBC 27.5*  < > 21.3* 20.6* 12.4* 7.6 6.5  NEUTROABS 23.6*  --   --   --   --   --   --  HGB 9.3*  < > 7.6* 7.7* 7.4* 8.5* 9.7*  HCT 33.2*  < > 23.0* 23.3* 23.3* 27.3* 31.2*  MCV 93.0  < > 79.3 78.7 80.9 83.5 82.3  PLT 425*  < > 321 310 242 232 213  < > = values in this interval not displayed. Cardiac Enzymes:  Recent Labs Lab 01/18/15 0100 01/18/15 0920 01/18/15 1530 01/18/15 2130 01/19/15 0330  TROPONINI 0.39* 1.00* 0.97* 0.73* 0.53*   BNP (last 3 results) No results for input(s): BNP in the last 8760 hours.  ProBNP (last 3 results)  Recent Labs  09/30/14 1431  PROBNP 794.3*    CBG:  Recent Labs Lab 01/21/15 0750 01/21/15 0808 01/21/15 0838 01/21/15 1016 01/21/15 1215  GLUCAP 68* 62* 91 167* 266*    Recent Results (from the past 240 hour(s))  Blood culture (routine x 2)     Status: None (Preliminary result)   Collection Time: 01/17/15  3:39 PM   Result Value Ref Range Status   Specimen Description BLOOD RIGHT WRIST  Final   Special Requests BOTTLES DRAWN AEROBIC ONLY 1CC  Final   Culture   Final           BLOOD CULTURE RECEIVED NO GROWTH TO DATE CULTURE WILL BE HELD FOR 5 DAYS BEFORE ISSUING A FINAL NEGATIVE REPORT Performed at [facility]    Report Status PENDING  Incomplete  Blood culture (routine x 2)     Status: None (Preliminary result)   Collection Time: 01/17/15  3:45 PM  Result Value Ref Range Status   Specimen Description BLOOD ARM RIGHT  Final   Special Requests   Final    BOTTLES DRAWN AEROBIC AND ANAEROBIC 5CCBLUE 2.5CCRED   Culture   Final           BLOOD CULTURE RECEIVED NO GROWTH TO DATE CULTURE WILL BE HELD FOR 5 DAYS BEFORE ISSUING A FINAL NEGATIVE REPORT Performed at [facility]    Report Status PENDING  Incomplete  Urine culture     Status: None   Collection Time: 01/17/15  6:27 PM  Result Value Ref Range Status   Specimen Description URINE, CATHETERIZED  Final   Special Requests NONE  Final   Colony Count NO GROWTH Performed at [facility]   Final   Culture NO GROWTH Performed at [facility]   Final   Report Status 01/19/2015 FINAL  Final  MRSA PCR Screening     Status: None   Collection Time: 01/17/15  7:51 PM  Result Value Ref Range Status   MRSA by PCR NEGATIVE NEGATIVE Final    Comment:        The GeneXpert MRSA Assay (FDA approved for NASAL specimens only), is one component of a comprehensive MRSA colonization surveillance program. It is not intended to diagnose MRSA infection nor to guide or monitor treatment for MRSA infections.      Studies: No results found.  Scheduled Meds: . antiseptic oral rinse  7 mL Mouth Rinse q12n4p  . aspirin  81 mg Oral Daily  . chlorhexidine  15 mL Mouth Rinse BID  . clopidogrel  75 mg Oral Q breakfast  . famotidine  20 mg Oral Daily  . insulin aspart  0-15 Units Subcutaneous TID WC  . insulin aspart  0-5  Units Subcutaneous QHS  . insulin aspart  6 Units Subcutaneous TID WC  . insulin detemir  25 Units Subcutaneous BID  . pravastatin  20 mg Oral Daily  .  warfarin  7.5 mg Oral ONCE-1800  . Warfarin - Pharmacist Dosing Inpatient   Does not apply q1800   Continuous Infusions: . sodium chloride 10 mL/hr at 01/19/15 2030  . heparin 1,000 Units/hr (01/21/15 1414)    Active Problems:   DKA (diabetic ketoacidoses)   Diabetic ketoacidosis with coma associated with other specified diabetes mellitus   Fever    CHIU, STEPHEN K  [facility] Pager [phone]. If 7PM-7AM, please contact night-coverage at [email or web address], password [facility] 01/21/2015, 3:32 PM  LOS: 4 days

## 2015-01-21 NOTE — Progress Notes (Signed)
ANTICOAGULATION CONSULT NOTE - Follow Up Consult  Pharmacy Consult for Heparin/Coumadin Indication: Mech AVR  Allergies  Allergen Reactions  . Celebrex [Celecoxib] Rash  . Detrol [Tolterodine] Hives    Patient Measurements: Height: 5\' 3"  (160 cm) Weight: 170 lb 3.1 oz (77.2 kg) IBW/kg (Calculated) : 52.4 Heparin Dosing Weight: 69 kg  Vital Signs: Temp: 97.9 F (36.6 C) (03/04 0516) Temp Source: Oral (03/04 0516) BP: 137/49 mmHg (03/04 0516) Pulse Rate: 71 (03/04 0516)  Labs:  Recent Labs  01/18/15 1530  01/18/15 2130 01/19/15 0330 01/19/15 0755 01/20/15 0515 01/20/15 1818 01/21/15 0508  HGB  --   < >  --   --  7.4* 8.5*  --  9.7*  HCT  --   < >  --   --  23.3* 27.3*  --  31.2*  PLT  --   < >  --   --  242 232  --  213  LABPROT  --   --   --  34.9*  --  22.8*  --  20.0*  INR  --   --   --  3.43*  --  2.00*  --  1.68*  HEPARINUNFRC  --   --   --   --   --   --  0.37 0.37  CREATININE  --   < >  --   --  0.87 0.76  --  0.63  TROPONINI 0.97*  --  0.73* 0.53*  --   --   --   --   < > = values in this interval not displayed.  Estimated Creatinine Clearance: 79.1 mL/min (by C-G formula based on Cr of 0.63).  Assessment: CC: 54 yo F found down by EMS after she didn't answer doorbell for PT home visit. AMS, DKA, hypotensive  PMH: CAD, HLD, HTN, CVA, AS, DM, RA  AC: Warfarin for hx mech AVR (goal INR 2.5-3.5) - INR 6.14 (1mg  Vit K IV 3/1)>>3.43 >> 2>1.68 (started heparin bridge 3/3). Heparin level 0.37 in goal. Hgb 9.7 improved. - PTA: Coumadin 5mg  daily except 7.5mg  on Weds   ID: r/o sepsis/endocarditis (ECHO neg vege), PC 1.81. - Afebrile, WBC down 6.5. Abx stopped. BC still pending.  CTX 3/1>>3/3 Vanc 2/29>>3/2 Zosyn 2/29>>3/1  2/29 Blood - NGTD  CV: Hx CAD, HTN, HLD, AS, EF 45-50% (1/16), elevated troponins. VSS on asa81, plavix, prava  Endo: Hx DM, admitted with severe DKA, cortisol 65 - off drip, SSI + 25 Lantus BID. CBGs hypoglycemic (down to  18)  GI/Nutr: Pureed, poor intake - LFTs WNL, Pepcid20 once daily  Neuro: Hx CVA - drug screen neg, no meds  Renal: ARF resolved. Scr down to 0.63, lytes ok (no repeat Phos/Mg)  Pulm: RA  Heme/Onc: Hx RA - Hgb up to 9.7.   Best practices: Madison [name], warfarin, famotidine  Goal of Therapy:  INR 2.5-3.6  Heparin level 0.3-0.7 Monitor platelets by anticoagulation protocol: Yes   Plan:  Heparin 1000 units/hr. Daily HL and CBC Coumadin 7.5mg  po x 1 tonight. Daily INR   Madison [name] S. , PharmD, [facility] Clinical Staff Pharmacist Pager [phone]  Madison [name] 01/21/2015,10:00 AM

## 2015-01-21 NOTE — Evaluation (Signed)
Occupational Therapy Evaluation and Discharge Patient Details Name: Madison [name] MRN: [id number] DOB: November 17, 1961 Today's Date: 01/21/2015    History of Present Illness 54 y.o. female brought to [facility] ED 2/29 with AMS. In ED, found to have severe DKA. PMHx: of CAD, HTN, Stroke x2 affecting left side; Diabetes; and Rheumatoid arthritis   Clinical Impression   This 54 yo female admitted with above presents to acute OT at an overall S level only because of her blood sugar issues (she is Mod I with all BADLs). No further OT needs identified, we will sign off.    Follow Up Recommendations  No OT follow up    Equipment Recommendations  None recommended by OT       Precautions / Restrictions Precautions Precautions: Fall Restrictions Weight Bearing Restrictions: No      Mobility Bed Mobility               General bed mobility comments: Pt up in recliner upon my arrival  Transfers Overall transfer level: Modified independent   Transfers: Sit to/from Stand Sit to Stand: Modified independent (Device/Increase time) (slow and controlled)         General transfer comment: Pt did ambulate around the circle of unit 5W with S--no LOB noted (does ambulate close to items on her left)    Balance Overall balance assessment: Modified Independent Sitting-balance support: Feet supported;No upper extremity supported Sitting balance-Leahy Scale: Good                                      ADL Overall ADL's : Needs assistance/impaired                                       General ADL Comments: Overall pt just needs S due to blood sugar control. She can manage her BADLs at a Mod I level               Pertinent Vitals/Pain Pain Assessment: No/denies pain     Hand Dominance Right   Extremity/Trunk Assessment Upper Extremity Assessment Upper Extremity Assessment: LUE deficits/detail LUE Deficits / Details: old CVA pt with increased flexion  tone, that she says she usually gets botox injections for, but missed her last appointment due to being in hospital LUE Coordination: decreased fine motor;decreased gross motor           Communication Communication Communication: No difficulties   Cognition Arousal/Alertness: Awake/alert Behavior During Therapy: WFL for tasks assessed/performed Overall Cognitive Status: Within Functional Limits for tasks assessed                                Home Living Family/patient expects to be discharged to:: Private residence Living Arrangements: Other relatives (cousins x2 that moved in with her from [place]) Available Help at Discharge: Family;Available 24 hours/day Type of Home: House Home Access: Stairs to enter [employer] of Steps: 2 Entrance Stairs-Rails: Right Home Layout: Two level;Full bath on main level;Able to live on main level with bedroom/bathroom     Bathroom Shower/Tub: Tub/shower unit;Curtain Shower/tub characteristics: Engineer, building services: Standard     Home Equipment: Cane - single point;Walker - 2 wheels;Bedside commode;Grab bars - toilet;Grab bars - tub/shower   Additional Comments: Pt reports that her 2 cousins  from [place] have moved in with her as of this past Monday and they will be able to help her with bills and household tasks      Prior Functioning/Environment Level of Independence: Independent with assistive device(s)        Comments: Uses a RW out in the community, nothing in house    OT Diagnosis: Generalized weakness         OT Goals(Current goals can be found in the care plan section) Acute Rehab OT Goals Patient Stated Goal: home sooner than later  OT Frequency:                End of Session Equipment Utilized During Treatment:  (none)  Activity Tolerance: Patient tolerated treatment well Patient left: in chair;with call bell/phone within reach;with chair alarm set   Time: 8546-2703 OT Time Calculation  (min): 20 min Charges:  OT General Charges $OT Visit: 1 Procedure OT Evaluation $Initial OT Evaluation Tier I: 1 Procedure   Evette Georges [phone] 01/21/2015, 1:19 PM

## 2015-01-21 NOTE — Progress Notes (Signed)
Hypoglycemic Event  CBG: 18 at 0214  Treatment: D50 IV 50 mL  Symptoms: Sweaty  Follow-up CBG: Time:0232 CBG Result:150  Possible Reasons for Event: Unknown  Comments/MD notified:Notified Claiborne Billings, NP and NP ordered D5NS@50cc /hr    Gregor Hams  Remember to initiate Hypoglycemia Order Set & complete

## 2015-01-21 NOTE — Progress Notes (Signed)
Spoke with MD about patient's blood sugars, apple juice given this am for BS of 68. Will continue to monitor.

## 2015-01-21 NOTE — Discharge Instructions (Signed)

## 2015-01-21 NOTE — Progress Notes (Addendum)
Pt refused a snack. Pt states she only wants some water. Nelda Marseille, RN

## 2015-01-21 NOTE — Progress Notes (Signed)
Hypoglycemic Event  CBG: 18 at 0554 (lab called at 0549 with critical glucose of 29)  Treatment: D50 IV 50 mL  Symptoms: None  Follow-up CBG: [id number] CBG Result:126  Possible Reasons for Event: Unknown  Comments/MD notified: Notified Claiborne Billings, NP, NP stated to increase IVF to 75cc/hr and changed cbg to q4h.     Gregor Hams  Remember to initiate Hypoglycemia Order Set & complete

## 2015-01-21 NOTE — Progress Notes (Signed)
BS has been checked multiple times this shift, MD notified of all blood sugars. Will continue to monitor and recheck blood sugars.

## 2015-01-21 NOTE — Evaluation (Signed)
Physical Therapy Evaluation Patient Details Name: Madison [name] MRN: [id number] DOB: [birth year] Today's Date: 01/21/2015   History of Present Illness  54 y.o. F brought to [facility] ED 2/29 with AMS. In ED, found to have severe DKA. PMHx: of CAD, HTN, Stroke x2 affecting left side; Diabetes; and Rheumatoid arthritis  Clinical Impression  Pt admitted with the above diagnosis. Pt currently with functional limitations due to the deficits listed below (see PT Problem List). Ambulating generally well without an assistive device, HR elevated to 142 with short distance, asymptomatic. States she will have 24 hour supervision when she returns home from family that has just moved in during this admission. Pt will benefit from skilled PT to increase their independence and safety with mobility to allow discharge to the venue listed below.       Follow Up Recommendations Home health PT;Supervision/Assistance - 24 hour    Equipment Recommendations  None recommended by PT    Recommendations for Other Services       Precautions / Restrictions Precautions Precautions: Fall Restrictions Weight Bearing Restrictions: No      Mobility  Bed Mobility Overal bed mobility: Modified Independent             General bed mobility comments: Pt up in recliner upon my arrival  Transfers Overall transfer level: Needs assistance Equipment used: None Transfers: Sit to/from Stand Sit to Stand: Supervision         General transfer comment: Supervision for safety. Fair control with descent into recliner. Performed from lowest bed setting and recliner without physical assist.  Ambulation/Gait Ambulation/Gait assistance: Supervision Ambulation Distance (Feet): 40 Feet Assistive device: None Gait Pattern/deviations: Step-through pattern;Decreased step length - right;Decreased stance time - left;Decreased stride length;Decreased dorsiflexion - left;Drifts right/left Gait velocity: decreased   General Gait  Details: Mildly hemiparetic, but demonstrates ability to safely clear Lt foot and perform step through gait pattern. Declines use of an assistive device to ambulate with PT. Did not loose balance or require physical assist. HR did however elevate to 142 after short ambulatory distance.  Stairs            Wheelchair Mobility    Modified Rankin (Stroke Patients Only)       Balance Overall balance assessment: Modified Independent Sitting-balance support: Feet supported;No upper extremity supported Sitting balance-Leahy Scale: Good                                       Pertinent Vitals/Pain Pain Assessment: No/denies pain    Home Living Family/patient expects to be discharged to:: Private residence Living Arrangements: Other relatives (cousins x2 that just moved in with her from [place]) Available Help at Discharge: Family;Available 24 hours/day Type of Home: House Home Access: Stairs to enter Entrance Stairs-Rails: Right Entrance Stairs-Number of Steps: 2 Home Layout: Two level;Full bath on main level;Able to live on main level with bedroom/bathroom Home Equipment: Gilmer Mor - single point;Walker - 2 wheels;Bedside commode;Grab bars - toilet;Grab bars - tub/shower Additional Comments: Pt reports that her 2 cousins from [place] have moved in with her as of this past Monday and they will be able to help her with bills and household tasks    Prior Function Level of Independence: Independent with assistive device(s)         Comments: States she has been using a cane to ambulate in the community.     Hand Dominance  Dominant Hand: Right    Extremity/Trunk Assessment   Upper Extremity Assessment: Defer to OT evaluation       LUE Deficits / Details: old CVA pt with increased flexion tone, that she says she usually gets botox injections for, but missed her last appointment due to being in hospital   Lower Extremity Assessment: Generalized weakness   LLE  Deficits / Details:  (limited ankle AROM on Lt)     Communication   Communication: No difficulties  Cognition Arousal/Alertness: Awake/alert Behavior During Therapy: WFL for tasks assessed/performed Overall Cognitive Status: Within Functional Limits for tasks assessed                      General Comments General comments (skin integrity, edema, etc.): Elevated to 142 after ambulating 20 feet.    Exercises General Exercises - Lower Extremity Ankle Circles/Pumps: AROM;Both;10 reps;Seated Long Arc Quad: Strengthening;Both;10 reps;Seated Hip Flexion/Marching: Strengthening;Both;10 reps;Seated      Assessment/Plan    PT Assessment Patient needs continued PT services  PT Diagnosis Difficulty walking;Abnormality of gait;Generalized weakness   PT Problem List Decreased strength;Decreased range of motion;Decreased balance;Decreased activity tolerance;Decreased mobility;Decreased knowledge of use of DME;Decreased safety awareness;Cardiopulmonary status limiting activity;Impaired tone  PT Treatment Interventions DME instruction;Gait training;Stair training;Functional mobility training;Therapeutic activities;Therapeutic exercise;Balance training;Neuromuscular re-education;Patient/family education   PT Goals (Current goals can be found in the Care Plan section) Acute Rehab PT Goals Patient Stated Goal: Go home PT Goal Formulation: With patient Time For Goal Achievement: 02/04/15 Potential to Achieve Goals: Good    Frequency Min 3X/week   Barriers to discharge        Co-evaluation               End of Session Equipment Utilized During Treatment: Gait belt Activity Tolerance: Treatment limited secondary to medical complications (Comment) (HR elevated to 142 from resting rate of 79) Patient left: in bed;with bed alarm set;with call bell/phone within reach Nurse Communication: Mobility status;Other (comment) (HR 142)         Time: 3235-5732 PT Time Calculation (min)  (ACUTE ONLY): 15 min   Charges:   PT Evaluation $Initial PT Evaluation Tier I: 1 Procedure     PT G CodesBerton Mount 01/21/2015, 3:34 PM Sunday [place], [place] [phone]

## 2015-01-21 NOTE — Significant Event (Signed)
Rapid Response Event Note  Overview:    Ca;;ed to see patient for advice on low blood sugar.  Initial Focused Assessment: Upon arrival patient is alert, oriented; skin warm and dry; sipping on milk. No complaints. Patient states that her sugars at labile at home, and when they dip into the 40s she 'feels funny.'  Interventions: Discussed options with the RN. Currently hanging D10 per physician orders. Discussed repeat assessment, evaluation of equipment, symptoms of patient, etc.  Event Summary: Will assist as needed and monitor as necessary.   at      at          Kristine Linea

## 2015-01-22 DIAGNOSIS — Z7901 Long term (current) use of anticoagulants: Secondary | ICD-10-CM

## 2015-01-22 DIAGNOSIS — E11649 Type 2 diabetes mellitus with hypoglycemia without coma: Secondary | ICD-10-CM

## 2015-01-22 DIAGNOSIS — Z954 Presence of other heart-valve replacement: Secondary | ICD-10-CM

## 2015-01-22 DIAGNOSIS — G9340 Encephalopathy, unspecified: Secondary | ICD-10-CM

## 2015-01-22 LAB — BASIC METABOLIC PANEL
Anion gap: 5 (ref 5–15)
BUN: <5 mg/dL — ABNORMAL LOW (ref 6–23)
CO2: 24 mmol/L (ref 19–32)
Calcium: 8.8 mg/dL (ref 8.4–10.5)
Chloride: 110 mmol/L (ref 96–112)
Creatinine, Ser: 0.64 mg/dL (ref 0.50–1.10)
GLUCOSE: 73 mg/dL (ref 70–99)
Potassium: 4.1 mmol/L (ref 3.5–5.1)
Sodium: 139 mmol/L (ref 135–145)

## 2015-01-22 LAB — PROTIME-INR
INR: 2.04 — AB (ref 0.00–1.49)
PROTHROMBIN TIME: 23.2 s — AB (ref 11.6–15.2)

## 2015-01-22 LAB — CBC
HCT: 31.7 % — ABNORMAL LOW (ref 36.0–46.0)
HEMOGLOBIN: 9.9 g/dL — AB (ref 12.0–15.0)
MCH: 25.8 pg — ABNORMAL LOW (ref 26.0–34.0)
MCHC: 31.2 g/dL (ref 30.0–36.0)
MCV: 82.8 fL (ref 78.0–100.0)
Platelets: 221 10*3/uL (ref 150–400)
RBC: 3.83 MIL/uL — AB (ref 3.87–5.11)
RDW: 17.9 % — ABNORMAL HIGH (ref 11.5–15.5)
WBC: 7 10*3/uL (ref 4.0–10.5)

## 2015-01-22 LAB — GLUCOSE, CAPILLARY
GLUCOSE-CAPILLARY: 131 mg/dL — AB (ref 70–99)
GLUCOSE-CAPILLARY: 152 mg/dL — AB (ref 70–99)
GLUCOSE-CAPILLARY: 166 mg/dL — AB (ref 70–99)
GLUCOSE-CAPILLARY: 46 mg/dL — AB (ref 70–99)
GLUCOSE-CAPILLARY: 56 mg/dL — AB (ref 70–99)
Glucose-Capillary: 101 mg/dL — ABNORMAL HIGH (ref 70–99)
Glucose-Capillary: 129 mg/dL — ABNORMAL HIGH (ref 70–99)
Glucose-Capillary: 208 mg/dL — ABNORMAL HIGH (ref 70–99)
Glucose-Capillary: 28 mg/dL — CL (ref 70–99)
Glucose-Capillary: 54 mg/dL — ABNORMAL LOW (ref 70–99)
Glucose-Capillary: 63 mg/dL — ABNORMAL LOW (ref 70–99)
Glucose-Capillary: 66 mg/dL — ABNORMAL LOW (ref 70–99)
Glucose-Capillary: 66 mg/dL — ABNORMAL LOW (ref 70–99)
Glucose-Capillary: 70 mg/dL (ref 70–99)

## 2015-01-22 LAB — HEPARIN LEVEL (UNFRACTIONATED): Heparin Unfractionated: 0.39 IU/mL (ref 0.30–0.70)

## 2015-01-22 MED ORDER — DEXTROSE 50 % IV SOLN
INTRAVENOUS | Status: AC
  Administered 2015-01-22: 25 mL
  Filled 2015-01-22: qty 50

## 2015-01-22 MED ORDER — INSULIN ASPART 100 UNIT/ML ~~LOC~~ SOLN
3.0000 [IU] | Freq: Three times a day (TID) | SUBCUTANEOUS | Status: DC
  Administered 2015-01-23 (×2): 3 [IU] via SUBCUTANEOUS

## 2015-01-22 MED ORDER — INSULIN ASPART 100 UNIT/ML ~~LOC~~ SOLN
5.0000 [IU] | Freq: Three times a day (TID) | SUBCUTANEOUS | Status: DC
  Administered 2015-01-22 (×2): 5 [IU] via SUBCUTANEOUS

## 2015-01-22 MED ORDER — DEXTROSE 50 % IV SOLN: 50.0000 mL | Freq: Once | INTRAVENOUS | Status: AC

## 2015-01-22 MED ORDER — WARFARIN SODIUM 7.5 MG PO TABS
7.5000 mg | ORAL_TABLET | Freq: Once | ORAL | Status: AC
  Administered 2015-01-22: 7.5 mg via ORAL
  Filled 2015-01-22: qty 1

## 2015-01-22 MED ORDER — INSULIN DETEMIR 100 UNIT/ML ~~LOC~~ SOLN
10.0000 [IU] | Freq: Every day | SUBCUTANEOUS | Status: DC
  Administered 2015-01-23: 10 [IU] via SUBCUTANEOUS
  Filled 2015-01-22 (×2): qty 0.1

## 2015-01-22 MED ORDER — DEXTROSE 50 % IV SOLN
INTRAVENOUS | Status: AC
  Administered 2015-01-22: 50 mL
  Filled 2015-01-22: qty 50

## 2015-01-22 NOTE — Progress Notes (Signed)
Hypoglycemic Event  CBG: 28  Treatment: D50 IV 50 mL  Symptoms: Nervous/irritable  Follow-up CBG: Time:2049 CBG Result:101  Possible Reasons for Event: Unknown  Comments/MD notified:T Claiborne Billings, NP notified, order received for Dextrose  50%  50 ml IV push. Will monitor.Laurence Spates, Humberto Seals  Remember to initiate Hypoglycemia Order Set & complete

## 2015-01-22 NOTE — Progress Notes (Signed)
[facility] PROGRESS NOTE  Madison [name] QAS:[id number] DOB: August 14, 1961 DOA: 01/17/2015 PCP: Kristian Covey, MD  Assessment/Plan: 1. Hemodynamic shock 1. Resolved 2. Remains off pressors 3. S/p aggressive IVF hydration 2. ARF 1. Resolved 2. Renal function normalized 3. Continue to monitor closely 3. Anemia 1. hgb stable 2. Continue to follow CBC 4. Supertherapeutic INR 1. Pt was given vit K recently for INR of 6.14 2. Coumadin resumed on 3/2 5. Hx mechanical AVR 1. On therapeutic anticoagulation 2. INR trending up to just over 2 today 3. Continue heparin bridge 6. HTN 1. BP stable and controlled 7. HLD 1. Cont pravastatin 8. DKA 1. Now off insulin gtt 2. Home insulin regimen was continued 3. SSI coverage as needed 4. Pt continues with bouts of profound hypoglycemic episodes requiring continuous dextrose infusion 5. Have decreased insulin to 10 units levemir daily with 3 units meal coverage 9. Encephalopathy 1. Resolved 10. DVT prophylaxis 1. On therapeutic anticoagulation  Code Status: Full Family Communication: Pt in room Disposition Plan: Pending   Consultants:  Critical Care  Procedures:   Antibiotics: Abx: Vanco, start date 2/29 >>>3/1 Abx: Zosyn, start date 2/29 >>>3/1 Abx: Ceftriaxone 3/1>>> 3/3  HPI/Subjective: Wants to go home. Bouts of hypoglycemia noted overnight  Objective: Filed Vitals:   01/21/15 1312 01/21/15 2121 01/22/15 0603 01/22/15 1319  BP: 147/68 118/52 125/54 113/65  Pulse:    97  Temp: 98.9 F (37.2 C) 98.6 F (37 C) 98.3 F (36.8 C) 98.7 F (37.1 C)  TempSrc: Oral Oral Oral Oral  Resp: 16 12 15 20   Height:      Weight:      SpO2: 100% 100% 100% 100%    Intake/Output Summary (Last 24 hours) at 01/22/15 1809 Last data filed at 01/22/15 1808  Gross per 24 hour  Intake 3303.75 ml  Output   4975 ml  Net -1671.25 ml   Filed Weights   01/18/15 0500 01/20/15 0002  Weight: 76.6 kg (168 lb 14 oz) 77.2 kg (170  lb 3.1 oz)    Exam:   General:  Awake, in nad  Cardiovascular: regular, s1, s2  Respiratory: normal resp effort, no wheezing  Abdomen: soft, nondistended  Musculoskeletal: perfused, no clubbing   Data Reviewed: Basic Metabolic Panel:  Recent Labs Lab 01/18/15 0500  01/18/15 1800 01/19/15 0755 01/20/15 0515 01/21/15 0508 01/22/15 0407  NA 146*  < > 142 138 141 144 139  K 3.4*  < > 3.4* 4.5 3.9 3.9 4.1  CL 119*  < > 116* 113* 112 114* 110  CO2 18*  < > 19 19 25 20 24   GLUCOSE 205*  < > 197* 294* 76 29* 73  BUN 27*  < > 16 8 <5* <5* <5*  CREATININE 1.39*  < > 1.07 0.87 0.76 0.63 0.64  CALCIUM 7.6*  < > 7.4* 7.5* 8.3* 8.7 8.8  MG 1.7  --   --   --   --   --   --   PHOS 1.5*  --   --   --   --   --   --   < > = values in this interval not displayed. Liver Function Tests:  Recent Labs Lab 01/17/15 1840  AST 19  ALT 17  ALKPHOS 75  BILITOT 1.7*  PROT 5.2*  ALBUMIN 2.7*    Recent Labs Lab 01/18/15 0920  LIPASE 37   No results for input(s): AMMONIA in the last 168 hours. CBC:  Recent Labs Lab  01/17/15 1502  01/18/15 1800 01/19/15 0755 01/20/15 0515 01/21/15 0508 01/22/15 0407  WBC 27.5*  < > 20.6* 12.4* 7.6 6.5 7.0  NEUTROABS 23.6*  --   --   --   --   --   --   HGB 9.3*  < > 7.7* 7.4* 8.5* 9.7* 9.9*  HCT 33.2*  < > 23.3* 23.3* 27.3* 31.2* 31.7*  MCV 93.0  < > 78.7 80.9 83.5 82.3 82.8  PLT 425*  < > 310 242 232 213 221  < > = values in this interval not displayed. Cardiac Enzymes:  Recent Labs Lab 01/18/15 0100 01/18/15 0920 01/18/15 1530 01/18/15 2130 01/19/15 0330  TROPONINI 0.39* 1.00* 0.97* 0.73* 0.53*   BNP (last 3 results) No results for input(s): BNP in the last 8760 hours.  ProBNP (last 3 results)  Recent Labs  09/30/14 1431  PROBNP 794.3*    CBG:  Recent Labs Lab 01/22/15 0515 01/22/15 0744 01/22/15 1202 01/22/15 1221 01/22/15 1628  GLUCAP 166* 152* 63* 70 208*    Recent Results (from the past 240 hour(s))   Blood culture (routine x 2)     Status: None (Preliminary result)   Collection Time: 01/17/15  3:39 PM  Result Value Ref Range Status   Specimen Description BLOOD RIGHT WRIST  Final   Special Requests BOTTLES DRAWN AEROBIC ONLY 1CC  Final   Culture   Final           BLOOD CULTURE RECEIVED NO GROWTH TO DATE CULTURE WILL BE HELD FOR 5 DAYS BEFORE ISSUING A FINAL NEGATIVE REPORT Performed at [facility]    Report Status PENDING  Incomplete  Blood culture (routine x 2)     Status: None (Preliminary result)   Collection Time: 01/17/15  3:45 PM  Result Value Ref Range Status   Specimen Description BLOOD ARM RIGHT  Final   Special Requests   Final    BOTTLES DRAWN AEROBIC AND ANAEROBIC 5CCBLUE 2.5CCRED   Culture   Final           BLOOD CULTURE RECEIVED NO GROWTH TO DATE CULTURE WILL BE HELD FOR 5 DAYS BEFORE ISSUING A FINAL NEGATIVE REPORT Performed at [facility]    Report Status PENDING  Incomplete  Urine culture     Status: None   Collection Time: 01/17/15  6:27 PM  Result Value Ref Range Status   Specimen Description URINE, CATHETERIZED  Final   Special Requests NONE  Final   Colony Count NO GROWTH Performed at [facility]   Final   Culture NO GROWTH Performed at [facility]   Final   Report Status 01/19/2015 FINAL  Final  MRSA PCR Screening     Status: None   Collection Time: 01/17/15  7:51 PM  Result Value Ref Range Status   MRSA by PCR NEGATIVE NEGATIVE Final    Comment:        The GeneXpert MRSA Assay (FDA approved for NASAL specimens only), is one component of a comprehensive MRSA colonization surveillance program. It is not intended to diagnose MRSA infection nor to guide or monitor treatment for MRSA infections.      Studies: No results found.  Scheduled Meds: . antiseptic oral rinse  7 mL Mouth Rinse q12n4p  . aspirin  81 mg Oral Daily  . chlorhexidine  15 mL Mouth Rinse BID  . clopidogrel  75 mg Oral Q breakfast  .  famotidine  20 mg Oral Daily  .  insulin aspart  0-15 Units Subcutaneous TID WC  . insulin aspart  0-5 Units Subcutaneous QHS  . insulin aspart  5 Units Subcutaneous TID WC  . insulin detemir  15 Units Subcutaneous BID  . pravastatin  20 mg Oral Daily  . Warfarin - Pharmacist Dosing Inpatient   Does not apply q1800   Continuous Infusions: . sodium chloride 10 mL/hr at 01/19/15 2030  . dextrose 75 mL/hr at 01/22/15 0855  . dextrose 5 % and 0.9% NaCl 75 mL/hr (01/21/15 1726)  . heparin 1,000 Units/hr (01/22/15 1755)    Active Problems:   DKA (diabetic ketoacidoses)   Diabetic ketoacidosis with coma associated with other specified diabetes mellitus   Fever    Delynn Pursley K  [facility] Pager [phone]. If 7PM-7AM, please contact night-coverage at [email or web address], password [facility] 01/22/2015, 6:09 PM  LOS: 5 days

## 2015-01-22 NOTE — Progress Notes (Signed)
Hypoglycemic Event  CBG: 54  Treatment: 15 GM carbohydrate snack  Symptoms: None  Follow-up CBG: Time: 0040 CBG Result: 46  Possible Reasons for Event: Medication regimen: Levemir 15 units, Inadequate meal intake  Comments/MD notified: Lenny Pastel NP  After 2 attempts with PO treatment, Dextrose 50% 17ml given via IV. CBG then increased to 131.   Georgina Peer N  Remember to initiate Hypoglycemia Order Set & complete

## 2015-01-22 NOTE — Plan of Care (Signed)
Problem: Discharge Progression Outcomes Goal: CBGs controlled on DM discharge meds Outcome: Not Met (add Reason) Pt's blood sugar has been fluctuating, having intermittent hypoglycemic episode

## 2015-01-22 NOTE — Progress Notes (Signed)
ANTICOAGULATION CONSULT NOTE - Follow Up Consult  Pharmacy Consult for Heparin >> Coumadin Indication: mechanical AVR  Allergies  Allergen Reactions  . Celebrex [Celecoxib] Rash  . Detrol [Tolterodine] Hives    Patient Measurements: Height: 5\' 3"  (160 cm) Weight: 170 lb 3.1 oz (77.2 kg) IBW/kg (Calculated) : 52.4 Heparin Dosing Weight: 69 kg  Vital Signs: Temp: 98.3 F (36.8 C) (03/05 0603) Temp Source: Oral (03/05 0603) BP: 125/54 mmHg (03/05 0603)  Labs:  Recent Labs  01/20/15 0515 01/20/15 1818 01/21/15 0508 01/22/15 0407  HGB 8.5*  --  9.7* 9.9*  HCT 27.3*  --  31.2* 31.7*  PLT 232  --  213 221  LABPROT 22.8*  --  20.0* 23.2*  INR 2.00*  --  1.68* 2.04*  HEPARINUNFRC  --  0.37 0.37 0.39  CREATININE 0.76  --  0.63 0.64    Estimated Creatinine Clearance: 79.1 mL/min (by C-G formula based on Cr of 0.64).   Medications:  Scheduled:  . antiseptic oral rinse  7 mL Mouth Rinse q12n4p  . aspirin  81 mg Oral Daily  . chlorhexidine  15 mL Mouth Rinse BID  . clopidogrel  75 mg Oral Q breakfast  . famotidine  20 mg Oral Daily  . insulin aspart  0-15 Units Subcutaneous TID WC  . insulin aspart  0-5 Units Subcutaneous QHS  . insulin aspart  5 Units Subcutaneous TID WC  . insulin detemir  15 Units Subcutaneous BID  . pravastatin  20 mg Oral Daily  . Warfarin - Pharmacist Dosing Inpatient   Does not apply q1800   Infusions:  . sodium chloride 10 mL/hr at 01/19/15 2030  . dextrose 75 mL/hr at 01/22/15 0855  . dextrose 5 % and 0.9% NaCl 75 mL/hr (01/21/15 1726)  . heparin 1,000 Units/hr (01/21/15 1414)    Assessment: 54 yo F on Coumadin PTA for hx mechanical AVR. Pt was admitted after being found down by EMS thought 2/2 DKA. INR 6.14 on 3/1 and pt received 1mg  IV Vit K. INR decreased to <2.5 and bridge therapy with heparin was started 3/3. Pt is currently therapeutic on heparin at 1000 units/hr.  INR is trending up.  Home Coumadin dose = 5mg  daily except 7.5mg   on Wednesdays   Goal of Therapy:  Heparin level 0.3-0.7 units/ml INR 2.5-3.5 Monitor platelets by anticoagulation protocol: Yes   Plan:  Repeat Coumadin 7.5 mg PO x 1 tonight. Continue heparin at 1000 units/hr Continue daily heparin level, CBC, and INR  , Pharm.D., BCPS Clinical Pharmacist Pager [phone] 01/22/2015 10:53 AM

## 2015-01-22 NOTE — Progress Notes (Signed)
Patient BG at noon of 66. Hypoglycemia protocol initiated. Recheck BG of 70. Dr. Rhona Leavens aware and will readjust insulin. Patient appears asymptomatic. Will continue to monitor.  Sim Boast, RN

## 2015-01-23 LAB — PROTIME-INR
INR: 2.63 — ABNORMAL HIGH (ref 0.00–1.49)
Prothrombin Time: 28.4 seconds — ABNORMAL HIGH (ref 11.6–15.2)

## 2015-01-23 LAB — BASIC METABOLIC PANEL
Anion gap: 3 — ABNORMAL LOW (ref 5–15)
BUN: 5 mg/dL — ABNORMAL LOW (ref 6–23)
CO2: 22 mmol/L (ref 19–32)
CREATININE: 0.71 mg/dL (ref 0.50–1.10)
Calcium: 7.9 mg/dL — ABNORMAL LOW (ref 8.4–10.5)
Chloride: 109 mmol/L (ref 96–112)
GFR calc Af Amer: >90 mL/min (ref >90)
GFR calc non Af Amer: >90 mL/min (ref >90)
Glucose, Bld: 320 mg/dL — ABNORMAL HIGH (ref 70–99)
Potassium: 4.5 mmol/L (ref 3.5–5.1)
Sodium: 134 mmol/L — ABNORMAL LOW (ref 135–145)

## 2015-01-23 LAB — GLUCOSE, CAPILLARY
GLUCOSE-CAPILLARY: 374 mg/dL — AB (ref 70–99)
GLUCOSE-CAPILLARY: 160 mg/dL — AB (ref 70–99)
GLUCOSE-CAPILLARY: 186 mg/dL — AB (ref 70–99)
Glucose-Capillary: 101 mg/dL — ABNORMAL HIGH (ref 70–99)
Glucose-Capillary: 172 mg/dL — ABNORMAL HIGH (ref 70–99)
Glucose-Capillary: 193 mg/dL — ABNORMAL HIGH (ref 70–99)
Glucose-Capillary: 229 mg/dL — ABNORMAL HIGH (ref 70–99)

## 2015-01-23 LAB — CBC
HEMATOCRIT: 29.7 % — AB (ref 36.0–46.0)
Hemoglobin: 9.2 g/dL — ABNORMAL LOW (ref 12.0–15.0)
MCH: 25.8 pg — ABNORMAL LOW (ref 26.0–34.0)
MCHC: 31 g/dL (ref 30.0–36.0)
MCV: 83.2 fL (ref 78.0–100.0)
PLATELETS: 229 10*3/uL (ref 150–400)
RBC: 3.57 MIL/uL — ABNORMAL LOW (ref 3.87–5.11)
RDW: 18.8 % — AB (ref 11.5–15.5)
WBC: 8.5 10*3/uL (ref 4.0–10.5)

## 2015-01-23 LAB — CULTURE, BLOOD (ROUTINE X 2)
Culture: NO GROWTH
Culture: NO GROWTH

## 2015-01-23 LAB — HEPARIN LEVEL (UNFRACTIONATED): Heparin Unfractionated: 0.61 IU/mL (ref 0.30–0.70)

## 2015-01-23 MED ORDER — WARFARIN SODIUM 5 MG PO TABS
5.0000 mg | ORAL_TABLET | Freq: Once | ORAL | Status: AC
  Administered 2015-01-23: 5 mg via ORAL
  Filled 2015-01-23: qty 1

## 2015-01-23 NOTE — Progress Notes (Signed)
ANTICOAGULATION CONSULT NOTE - Follow Up Consult  Pharmacy Consult for Heparin >> Coumadin Indication: mechanical AVR  Allergies  Allergen Reactions  . Celebrex [Celecoxib] Rash  . Detrol [Tolterodine] Hives    Patient Measurements: Height: 5\' 3"  (160 cm) Weight: 170 lb 3.1 oz (77.2 kg) IBW/kg (Calculated) : 52.4 Heparin Dosing Weight: 69 kg  Vital Signs: Temp: 99.1 F (37.3 C) (03/06 0519) Temp Source: Oral (03/06 0519) BP: 116/50 mmHg (03/06 0519) Pulse Rate: 95 (03/06 0519)  Labs:  Recent Labs  01/21/15 0508 01/22/15 0407 01/23/15 0530  HGB 9.7* 9.9* 9.2*  HCT 31.2* 31.7* 29.7*  PLT 213 221 229  LABPROT 20.0* 23.2* 28.4*  INR 1.68* 2.04* 2.63*  HEPARINUNFRC 0.37 0.39 0.61  CREATININE 0.63 0.64 0.71    Estimated Creatinine Clearance: 79.1 mL/min (by C-G formula based on Cr of 0.71).   Medications:  Scheduled:  . antiseptic oral rinse  7 mL Mouth Rinse q12n4p  . aspirin  81 mg Oral Daily  . chlorhexidine  15 mL Mouth Rinse BID  . clopidogrel  75 mg Oral Q breakfast  . famotidine  20 mg Oral Daily  . insulin aspart  0-15 Units Subcutaneous TID WC  . insulin aspart  0-5 Units Subcutaneous QHS  . insulin aspart  3 Units Subcutaneous TID WC  . insulin detemir  10 Units Subcutaneous Daily  . pravastatin  20 mg Oral Daily  . Warfarin - Pharmacist Dosing Inpatient   Does not apply q1800   Infusions:  . sodium chloride 10 mL/hr at 01/19/15 2030  . heparin 1,000 Units/hr (01/22/15 1755)    Assessment: 54 yo F on Coumadin PTA for hx mechanical AVR. Pt was admitted after being found down by EMS thought 2/2 DKA. INR 6.14 on 3/1 and pt received 1mg  IV Vit K. INR decreased to <2.5 and bridge therapy with heparin was started on 3/3. Pt is currently therapeutic on heparin at 1000 units/hr.  INR is also therapeutic.  Will discontinue heparin bridge.  Home Coumadin dose = 5mg  daily except 7.5mg  on Wednesdays   Goal of Therapy:  Heparin level 0.3-0.7  units/ml INR 2.5-3.5 Monitor platelets by anticoagulation protocol: Yes   Plan:  Discontinue heparin infusion and associated labs. Coumadin 5 mg PO x 1 tonight. Continue daily INR  5/3, Pharm.D., BCPS Clinical Pharmacist Pager [phone] 01/23/2015 11:20 AM

## 2015-01-23 NOTE — Progress Notes (Signed)
[facility] PROGRESS NOTE  Madison [name] URK:[id number] DOB: 01/08/1961 DOA: 01/17/2015 PCP: Kristian Covey, MD  Assessment/Plan: 1. Hemodynamic shock 1. Resolved 2. Remains off pressors 3. S/p aggressive IVF hydration 2. ARF 1. Resolved 2. Renal function normalized 3. Continue to monitor closely 3. Anemia 1. hgb stable 2. Continue to follow CBC 4. Supertherapeutic INR 1. Pt was given vit K recently for INR of 6.14 2. Coumadin resumed on 3/2 5. Hx mechanical AVR 1. On therapeutic anticoagulation 2. INR is now in therapeutic range at 2.6 3. Discontinue heparin bridge 6. HTN 1. BP stable and controlled 7. HLD 1. Cont pravastatin 8. DKA 1. Now off insulin gtt 2. Home insulin regimen was initially continued with levemir 25 units BID 3. This resulted in profound hypoglycemia, requiring continuous dextrose infusion 4. Insulin has since been decreased to 10units qday with 3 units meal coverage 5. Patient follows Endocrine for glucose management 6. Glucose now in the 300's. Will d/c dextrose infusion and monitor closely. 7. Continue SSI coverage as needed 9. Encephalopathy 1. Resolved. Likely secondary to DKA 10. DVT prophylaxis 1. On therapeutic anticoagulation  Code Status: Full Family Communication: Pt in room Disposition Plan: Pending   Consultants:  Critical Care  Procedures:   Antibiotics: Abx: Vanco, start date 2/29 >>>3/1 Abx: Zosyn, start date 2/29 >>>3/1 Abx: Ceftriaxone 3/1>>> 3/3  HPI/Subjective: Feels well. Eager to go home  Objective: Filed Vitals:   01/22/15 1319 01/22/15 2114 01/23/15 0519 01/23/15 1334  BP: 113/65 108/48 116/50 107/50  Pulse: 97 93 95 98  Temp: 98.7 F (37.1 C) 99.4 F (37.4 C) 99.1 F (37.3 C) 98.5 F (36.9 C)  TempSrc: Oral Oral Oral Oral  Resp: 20 18 18 18   Height:      Weight:      SpO2: 100% 100% 98% 100%    Intake/Output Summary (Last 24 hours) at 01/23/15 1442 Last data filed at 01/23/15 1432   Gross per 24 hour  Intake 1892.5 ml  Output   4300 ml  Net -2407.5 ml   Filed Weights   01/18/15 0500 01/20/15 0002  Weight: 76.6 kg (168 lb 14 oz) 77.2 kg (170 lb 3.1 oz)    Exam:   General:  Awake, sitting in chair, in nad  Cardiovascular: regular, s1, s2  Respiratory: normal resp effort, no wheezing  Abdomen: soft, nondistended  Musculoskeletal: perfused, no clubbing   Data Reviewed: Basic Metabolic Panel:  Recent Labs Lab 01/18/15 0500  01/19/15 0755 01/20/15 0515 01/21/15 0508 01/22/15 0407 01/23/15 0530  NA 146*  < > 138 141 144 139 134*  K 3.4*  < > 4.5 3.9 3.9 4.1 4.5  CL 119*  < > 113* 112 114* 110 109  CO2 18*  < > 19 25 20 24 22   GLUCOSE 205*  < > 294* 76 29* 73 320*  BUN 27*  < > 8 <5* <5* <5* 5*  CREATININE 1.39*  < > 0.87 0.76 0.63 0.64 0.71  CALCIUM 7.6*  < > 7.5* 8.3* 8.7 8.8 7.9*  MG 1.7  --   --   --   --   --   --   PHOS 1.5*  --   --   --   --   --   --   < > = values in this interval not displayed. Liver Function Tests:  Recent Labs Lab 01/17/15 1840  AST 19  ALT 17  ALKPHOS 75  BILITOT 1.7*  PROT 5.2*  ALBUMIN 2.7*  Recent Labs Lab 01/18/15 0920  LIPASE 37   No results for input(s): AMMONIA in the last 168 hours. CBC:  Recent Labs Lab 01/17/15 1502  01/19/15 0755 01/20/15 0515 01/21/15 0508 01/22/15 0407 01/23/15 0530  WBC 27.5*  < > 12.4* 7.6 6.5 7.0 8.5  NEUTROABS 23.6*  --   --   --   --   --   --   HGB 9.3*  < > 7.4* 8.5* 9.7* 9.9* 9.2*  HCT 33.2*  < > 23.3* 27.3* 31.2* 31.7* 29.7*  MCV 93.0  < > 80.9 83.5 82.3 82.8 83.2  PLT 425*  < > 242 232 213 221 229  < > = values in this interval not displayed. Cardiac Enzymes:  Recent Labs Lab 01/18/15 0100 01/18/15 0920 01/18/15 1530 01/18/15 2130 01/19/15 0330  TROPONINI 0.39* 1.00* 0.97* 0.73* 0.53*   BNP (last 3 results) No results for input(s): BNP in the last 8760 hours.  ProBNP (last 3 results)  Recent Labs  09/30/14 1431  PROBNP 794.3*     CBG:  Recent Labs Lab 01/22/15 2049 01/23/15 0031 01/23/15 0416 01/23/15 0746 01/23/15 1222  GLUCAP 101* 101* 229* 374* 193*    Recent Results (from the past 240 hour(s))  Blood culture (routine x 2)     Status: None   Collection Time: 01/17/15  3:39 PM  Result Value Ref Range Status   Specimen Description BLOOD RIGHT WRIST  Final   Special Requests BOTTLES DRAWN AEROBIC ONLY 1CC  Final   Culture   Final    NO GROWTH 5 DAYS Performed at [facility]    Report Status 01/23/2015 FINAL  Final  Blood culture (routine x 2)     Status: None   Collection Time: 01/17/15  3:45 PM  Result Value Ref Range Status   Specimen Description BLOOD ARM RIGHT  Final   Special Requests   Final    BOTTLES DRAWN AEROBIC AND ANAEROBIC 5CCBLUE 2.5CCRED   Culture   Final    NO GROWTH 5 DAYS Performed at [facility]    Report Status 01/23/2015 FINAL  Final  Urine culture     Status: None   Collection Time: 01/17/15  6:27 PM  Result Value Ref Range Status   Specimen Description URINE, CATHETERIZED  Final   Special Requests NONE  Final   Colony Count NO GROWTH Performed at [facility]   Final   Culture NO GROWTH Performed at [facility]   Final   Report Status 01/19/2015 FINAL  Final  MRSA PCR Screening     Status: None   Collection Time: 01/17/15  7:51 PM  Result Value Ref Range Status   MRSA by PCR NEGATIVE NEGATIVE Final    Comment:        The GeneXpert MRSA Assay (FDA approved for NASAL specimens only), is one component of a comprehensive MRSA colonization surveillance program. It is not intended to diagnose MRSA infection nor to guide or monitor treatment for MRSA infections.      Studies: No results found.  Scheduled Meds: . antiseptic oral rinse  7 mL Mouth Rinse q12n4p  . aspirin  81 mg Oral Daily  . chlorhexidine  15 mL Mouth Rinse BID  . clopidogrel  75 mg Oral Q breakfast  . famotidine  20 mg Oral Daily  . insulin aspart   0-15 Units Subcutaneous TID WC  . insulin aspart  0-5 Units Subcutaneous QHS  . insulin aspart  3  Units Subcutaneous TID WC  . insulin detemir  10 Units Subcutaneous Daily  . pravastatin  20 mg Oral Daily  . warfarin  5 mg Oral ONCE-1800  . Warfarin - Pharmacist Dosing Inpatient   Does not apply q1800   Continuous Infusions: . sodium chloride 10 mL/hr at 01/19/15 2030    Active Problems:   DKA (diabetic ketoacidoses)   Diabetic ketoacidosis with coma associated with other specified diabetes mellitus   Fever    Adriona Kaney K  [facility] Pager [phone]. If 7PM-7AM, please contact night-coverage at [email or web address], password [email or web address] 01/23/2015, 2:42 PM  LOS: 6 days

## 2015-01-24 DIAGNOSIS — E785 Hyperlipidemia, unspecified: Secondary | ICD-10-CM

## 2015-01-24 DIAGNOSIS — I959 Hypotension, unspecified: Secondary | ICD-10-CM | POA: Insufficient documentation

## 2015-01-24 DIAGNOSIS — R509 Fever, unspecified: Secondary | ICD-10-CM | POA: Insufficient documentation

## 2015-01-24 LAB — CBC
HEMATOCRIT: 31.9 % — AB (ref 36.0–46.0)
HEMOGLOBIN: 10.1 g/dL — AB (ref 12.0–15.0)
MCH: 25.7 pg — ABNORMAL LOW (ref 26.0–34.0)
MCHC: 31.7 g/dL (ref 30.0–36.0)
MCV: 81.2 fL (ref 78.0–100.0)
Platelets: 275 10*3/uL (ref 150–400)
RBC: 3.93 MIL/uL (ref 3.87–5.11)
RDW: 18.2 % — ABNORMAL HIGH (ref 11.5–15.5)
WBC: 7.1 10*3/uL (ref 4.0–10.5)

## 2015-01-24 LAB — BASIC METABOLIC PANEL
ANION GAP: 3 — AB (ref 5–15)
BUN: 9 mg/dL (ref 6–23)
CALCIUM: 8.8 mg/dL (ref 8.4–10.5)
CHLORIDE: 109 mmol/L (ref 96–112)
CO2: 23 mmol/L (ref 19–32)
Creatinine, Ser: 0.72 mg/dL (ref 0.50–1.10)
GFR calc Af Amer: >90 mL/min (ref >90)
GFR calc non Af Amer: >90 mL/min (ref >90)
GLUCOSE: 136 mg/dL — AB (ref 70–99)
Potassium: 3.3 mmol/L — ABNORMAL LOW (ref 3.5–5.1)
Sodium: 135 mmol/L (ref 135–145)

## 2015-01-24 LAB — GLUCOSE, CAPILLARY
GLUCOSE-CAPILLARY: 262 mg/dL — AB (ref 70–99)
Glucose-Capillary: 162 mg/dL — ABNORMAL HIGH (ref 70–99)
Glucose-Capillary: 278 mg/dL — ABNORMAL HIGH (ref 70–99)
Glucose-Capillary: 319 mg/dL — ABNORMAL HIGH (ref 70–99)
Glucose-Capillary: 381 mg/dL — ABNORMAL HIGH (ref 70–99)
Glucose-Capillary: 39 mg/dL — CL (ref 70–99)
Glucose-Capillary: 84 mg/dL (ref 70–99)

## 2015-01-24 LAB — PROTIME-INR
INR: 2.17 — AB (ref 0.00–1.49)
Prothrombin Time: 24.4 seconds — ABNORMAL HIGH (ref 11.6–15.2)

## 2015-01-24 LAB — TROPONIN I: Troponin I: <0.03 ng/mL (ref <0.031)

## 2015-01-24 MED ORDER — ENOXAPARIN SODIUM 80 MG/0.8ML ~~LOC~~ SOLN
80.0000 mg | Freq: Once | SUBCUTANEOUS | Status: AC
  Administered 2015-01-24: 80 mg via SUBCUTANEOUS
  Filled 2015-01-24: qty 0.8

## 2015-01-24 MED ORDER — ENOXAPARIN SODIUM 80 MG/0.8ML ~~LOC~~ SOLN
80.0000 mg | Freq: Two times a day (BID) | SUBCUTANEOUS | Status: DC
  Filled 2015-01-24: qty 0.8

## 2015-01-24 MED ORDER — INSULIN ASPART 100 UNIT/ML ~~LOC~~ SOLN
2.0000 [IU] | Freq: Three times a day (TID) | SUBCUTANEOUS | Status: DC
Start: 2015-01-24 — End: 2015-01-24

## 2015-01-24 MED ORDER — INSULIN DETEMIR 100 UNIT/ML FLEXPEN: 15.0000 [IU] | PEN_INJECTOR | Freq: Every day | SUBCUTANEOUS | Status: DC

## 2015-01-24 MED ORDER — INSULIN ASPART 100 UNIT/ML ~~LOC~~ SOLN
10.0000 [IU] | Freq: Once | SUBCUTANEOUS | Status: AC
  Administered 2015-01-24: 10 [IU] via SUBCUTANEOUS

## 2015-01-24 MED ORDER — GI COCKTAIL ~~LOC~~
30.0000 mL | Freq: Three times a day (TID) | ORAL | Status: DC | PRN
  Filled 2015-01-24: qty 30

## 2015-01-24 MED ORDER — INSULIN ASPART 100 UNIT/ML ~~LOC~~ SOLN: 2.0000 [IU] | Freq: Three times a day (TID) | SUBCUTANEOUS | Status: DC

## 2015-01-24 MED ORDER — ENOXAPARIN SODIUM 80 MG/0.8ML ~~LOC~~ SOLN: 80.0000 mg | Freq: Two times a day (BID) | SUBCUTANEOUS | Status: DC

## 2015-01-24 MED ORDER — POTASSIUM CHLORIDE CRYS ER 20 MEQ PO TBCR: 40.0000 meq | EXTENDED_RELEASE_TABLET | Freq: Two times a day (BID) | ORAL | Status: DC

## 2015-01-24 MED ORDER — INSULIN DETEMIR 100 UNIT/ML ~~LOC~~ SOLN
18.0000 [IU] | Freq: Every day | SUBCUTANEOUS | Status: DC
  Administered 2015-01-24: 18 [IU] via SUBCUTANEOUS
  Filled 2015-01-24: qty 0.18

## 2015-01-24 MED ORDER — POTASSIUM CHLORIDE CRYS ER 20 MEQ PO TBCR
40.0000 meq | EXTENDED_RELEASE_TABLET | Freq: Two times a day (BID) | ORAL | Status: DC
  Administered 2015-01-24: 40 meq via ORAL
  Filled 2015-01-24: qty 2

## 2015-01-24 MED ORDER — WARFARIN SODIUM 7.5 MG PO TABS
7.5000 mg | ORAL_TABLET | Freq: Once | ORAL | Status: AC
  Administered 2015-01-24: 7.5 mg via ORAL
  Filled 2015-01-24: qty 1

## 2015-01-24 NOTE — Progress Notes (Addendum)
ANTICOAGULATION CONSULT NOTE - Follow Up Consult  Pharmacy Consult for Coumadin Indication: mechanical AVR, hx CVA  Allergies  Allergen Reactions  . Celebrex [Celecoxib] Rash  . Detrol [Tolterodine] Hives    Patient Measurements: Height: 5\' 3"  (160 cm) Weight: 170 lb 3.1 oz (77.2 kg) IBW/kg (Calculated) : 52.4  Vital Signs: Temp: 98.3 F (36.8 C) (03/07 0440) Temp Source: Oral (03/07 0440) BP: 114/46 mmHg (03/07 0440) Pulse Rate: 74 (03/07 0440)  Labs:  Recent Labs  01/22/15 0407 01/23/15 0530 01/24/15 0853  HGB 9.9* 9.2* 10.1*  HCT 31.7* 29.7* 31.9*  PLT 221 229 275  LABPROT 23.2* 28.4* 24.4*  INR 2.04* 2.63* 2.17*  HEPARINUNFRC 0.39 0.61  --   CREATININE 0.64 0.71 0.72  TROPONINI  --   --  <0.03    Estimated Creatinine Clearance: 79.1 mL/min (by C-G formula based on Cr of 0.72).  Assessment:  54 yo F on Coumadin PTA for hx mechanical AVR. Pt was admitted after being found down by EMS thought 2/2 DKA. INR 6.14 on 3/1 and received Vit K 1 mg IV. INR decreased to <2.5 and heparin bridge begun 3/3. Heparin stopped on 3/6 when INR up to 2.63. INR trended down to 2.17 today.  Home Coumadin regimen = 5mg  daily except 7.5mg  on Wednesdays. Confirmed with patient.  INR variability discussed briefly.  Goal of Therapy:  INR 2.5-3.5 Monitor platelets by anticoagulation protocol: Yes   Plan:   Coumadin 7.5 mg x 1 today.  Daily PT/INR for now.  Hoping to resume home regimen on 01/25/15.  12-13-1992, 03/27/15 Pager: [phone] 01/24/2015,11:13 AM  Addendum:  - to add Lovenox until INR > 2.5 again.  - Will begin Lovenox 80 mg sq 12hrs.  First dose asap, then at midnight tonight, then 10am & 10pm starting tomorrow.  Discussed with patient.  [phone], RPh  1:24 PM 01/24/2015

## 2015-01-24 NOTE — Progress Notes (Signed)
Inpatient Diabetes Program Recommendations  AACE/ADA: New Consensus Statement on Inpatient Glycemic Control (2013)  Target Ranges:  Prepandial:   less than 140 mg/dL      Peak postprandial:   less than 180 mg/dL (1-2 hours)      Critically ill patients:  140 - 180 mg/dL   Reason for Assessment:  Note wide fluctuations in blood glucose while in the hospital.    Diabetes history: Type 1 diabetes Outpatient Diabetes medications: Levemir 15 units bid (However note in Med. Rec states that patient is taking Levemir 45 units bid), Humalog 6 units with evening meal  Current orders for Inpatient glycemic control:  Levemir 18 units daily, Novolog moderate tid with meals and HS, and Novolog 3 units tid with meals.  Patient received Novolog 10 units at 5 am due to CBG=381 mg/dL.  She then received another 8 units of Novolog with breakfast around 0800.  CBG down to 84 mg/dL at lunch.     Agree with increase of Levemir to 18 units today. Will follow.    Thanks, Beryl Meager, RN, BC-ADM Inpatient Diabetes Coordinator Pager [phone]

## 2015-01-24 NOTE — Discharge Summary (Signed)
Physician Discharge Summary  Madison [name] NZV:[id number] DOB: 01/20/1961 DOA: 01/17/2015  PCP: Kristian Covey, MD  Admit date: 01/17/2015 Discharge date: 01/24/2015  Time spent: 25 minutes  Recommendations for Outpatient Follow-up:  1. Follow up with PCP in 1-2 weeks 2. Follow up with Dr. Everardo All on 3/14 as schedule at 1 pm  Discharge Diagnoses:  Active Problems:   DKA (diabetic ketoacidoses)   Diabetic ketoacidosis with coma associated with other specified diabetes mellitus   Fever   Discharge Condition: Improved  Diet recommendation: Diabetic  Filed Weights   01/18/15 0500 01/20/15 0002  Weight: 76.6 kg (168 lb 14 oz) 77.2 kg (170 lb 3.1 oz)    History of present illness:  Please review h and p from 2/29 for details. Briefly, pt presented with severe DKA in the setting of poorly controlled DM. The patient was admitted for further work up.  Hospital Course:  1. Hemodynamic shock 1. Resolved 2. Initially pressor dependent, ultimately weaned off  3. S/p aggressive IVF hydration 2. ARF 1. Resolved 2. Renal function normalized 3. Continue to monitor closely 3. Anemia 1. hgb stable 2. Continue to follow CBC 4. Supertherapeutic INR 1. Pt was given vit K recently for INR of 6.14 2. Coumadin resumed on 3/2 3. INR on discharge 2.1. Pt to continue coumadin and bridge with lovenox until therapeutic. Pt instructed to follow up with INR checks 5. Hx mechanical AVR 1. On therapeutic anticoagulation 2. INR at 2.1 on day of discharge 3. Had been maintained on heparin bridge, to bridge on lovenox after discharge until INR therapeutic between 2.5-3.5 6. HTN 1. BP stable and controlled 2. Pt to continue on metoprolol alone on discharge, hold ACEI until BP tolerates 7. HLD 1. Cont pravastatin 8. DKA 1. Now off insulin gtt 2. Home insulin regimen was initially continued with levemir 25 units BID 3. This resulted in profound hypoglycemia, requiring continuous dextrose  infusion 4. Insulin has since been decreased to 10units qday with 3 units meal coverage 5. Patient follows Endocrine for glucose management 6. Glucose now in the 300's. Have d/c'd dextrose infusion 7. Continue SSI coverage as needed 8. Patient's insulin regimen changed to Levemir 18 units daily with 2 units of aspart before meals. 9. Patient to follow up closely with her primary Endocrinologist on discharge 9. Encephalopathy 1. Resolved. Likely secondary to DKA 10. DVT prophylaxis 1. On therapeutic anticoagulation   Consultations:  Diabetic Coordinator  Critical Care  Discharge Exam: Filed Vitals:   01/23/15 1334 01/23/15 2216 01/24/15 0440 01/24/15 1339  BP: 107/50 106/45 114/46 94/48  Pulse: 98 95 74 97  Temp: 98.5 F (36.9 C) 99.3 F (37.4 C) 98.3 F (36.8 C) 99.2 F (37.3 C)  TempSrc: Oral Oral Oral Oral  Resp: 18 19  18   Height:      Weight:      SpO2: 100% 99% 100% 100%    General: awake, in nad Cardiovascular: regular, s1, s2 Respiratory: normal resp effort, no wheezing  Discharge Instructions     Medication List    STOP taking these medications        lisinopril 2.5 MG tablet  Commonly known as:  PRINIVIL,ZESTRIL     traMADol 50 MG tablet  Commonly known as:  ULTRAM     zolpidem 10 MG tablet  Commonly known as:  AMBIEN      TAKE these medications        aspirin 81 MG tablet  Take 1 tablet (81 mg total) by mouth  daily.     baclofen 10 MG tablet  Commonly known as:  LIORESAL  Take 1 tablet (10 mg total) by mouth 2 (two) times daily.     clopidogrel 75 MG tablet  Commonly known as:  PLAVIX  Take 1 tablet (75 mg total) by mouth daily with breakfast.     enoxaparin 80 MG/0.8ML injection  Commonly known as:  LOVENOX  Inject 0.8 mLs (80 mg total) into the skin every 12 (twelve) hours.  Start taking on:  01/25/2015     escitalopram 20 MG tablet  Commonly known as:  LEXAPRO  Take 20 mg by mouth daily.     feeding supplement (GLUCERNA SHAKE)  Liqd  Take 237 mLs by mouth 2 (two) times daily between meals.     glucose blood test strip  Commonly known as:  ACCU-CHEK AVIVA  Use to check blood sugar 5 times per day Dx Code E10.10 And lancets 5/day     insulin aspart 100 UNIT/ML injection  Commonly known as:  novoLOG  Inject 2 Units into the skin 3 (three) times daily with meals.     Insulin Detemir 100 UNIT/ML Pen  Commonly known as:  LEVEMIR FLEXTOUCH  Inject 15 Units into the skin daily.     insulin lispro 100 UNIT/ML KiwkPen  Commonly known as:  HUMALOG KWIKPEN  Inject 6 units with evening meal     methotrexate 2.5 MG tablet  Commonly known as:  RHEUMATREX  Take 1 tablet (2.5 mg total) by mouth 2 (two) times a week. Caution:Chemotherapy. Protect from light.     metoprolol tartrate 25 MG tablet  Commonly known as:  LOPRESSOR  Take 0.5 tablets (12.5 mg total) by mouth 2 (two) times daily.     omeprazole 40 MG capsule  Commonly known as:  PRILOSEC  TAKE 1 CAPSULE (40 MG TOTAL) BY MOUTH DAILY.     pravastatin 20 MG tablet  Commonly known as:  PRAVACHOL  TAKE 1 TABLET EVERY DAY     promethazine 25 MG tablet  Commonly known as:  PHENERGAN  Take 1 tablet (25 mg total) by mouth every 4 (four) hours as needed for nausea or vomiting.     solifenacin 5 MG tablet  Commonly known as:  VESICARE  Take 0.5 tablets (2.5 mg total) by mouth daily.     warfarin 5 MG tablet  Commonly known as:  COUMADIN  Take 1-1.5 tablets (5-7.5 mg total) by mouth daily at 6 PM. Take 5 mg Daily By Mouth on , Tues, Thurs, Fri, Sat & Sun, Take 7.5 mg By Mouth on mondays and Wednesdays .       Allergies  Allergen Reactions  . Celebrex [Celecoxib] Rash  . Detrol [Tolterodine] Hives   Follow-up Information    Follow up with Romero Belling, MD On 01/31/2015.   Specialty:  Endocrinology   Why:  APPT scheduled for Monday, March 14th, 2016 @ 01:00pm with Dr. Romero Belling   Contact information:   [address] [place]  [address] [phone]       Follow up with Kristian Covey, MD. Schedule an appointment as soon as possible for a visit in 1 week.   Specialty:  Family Medicine   Contact information:   [address] [place] [address] [phone]        The results of significant diagnostics from this hospitalization (including imaging, microbiology, ancillary and laboratory) are listed below for reference.    Significant Diagnostic Studies:  Ct Head Wo Contrast  01/17/2015   CLINICAL DATA:  Found down on floor. Acute altered mental status. Hyperglycemia.  EXAM: CT HEAD WITHOUT CONTRAST  TECHNIQUE: Contiguous axial images were obtained from the base of the skull through the vertex without contrast.  COMPARISON:  12/03/2014  FINDINGS: Exam is limited with motion artifact. Brain atrophy evident with extensive periventricular and subcortical white matter microvascular changes throughout both cerebral hemispheres.  Extensive encephalomalacia in the right posterior frontal, anterior frontal, parietal, and temporal lobes from remote infarcts. Bilateral occipital and left posterior frontal encephalomalacia also evident. No acute intracranial hemorrhage, definite new infarction, midline shift, herniation, hydrocephalus, or extra-axial fluid collection. Cisterns patent. Remote left cerebellar infarct. Mastoids and sinuses clear. No gross skull abnormality.  IMPRESSION: Limited with motion artifact. Grossly stable exam with chronic bilateral infarcts, atrophy, and extensive white matter microvascular ischemic changes.   Electronically Signed   By: Judie Petit.  Shick M.D.   On: 01/17/2015 16:36   Dg Chest Port 1 View  01/19/2015   CLINICAL DATA:  Fever  EXAM: PORTABLE CHEST - 1 VIEW  COMPARISON:  Portable chest x-ray of 01/17/2015  FINDINGS: No pneumonia is seen. A right IJ central venous line is present with the tip overlying the expected SVC -RA junction. No pneumothorax is seen. Cardiomegaly is stable.  IMPRESSION: 1.  No active cardiopulmonary disease. 2. Right IJ central venous catheter overlies the expected SVC -RA junction.   Electronically Signed   By: Dwyane Dee M.D.   On: 01/19/2015 07:19   Dg Chest Portable 1 View  01/17/2015   CLINICAL DATA:  Altered mental status, fall, hyperglycemia  EXAM: PORTABLE CHEST - 1 VIEW  COMPARISON:  01/17/2015  FINDINGS: Cardiomediastinal silhouette is stable. Status post median sternotomy. There is right IJ central line with tip in right atrium. For distal SVC position the central line should be retracted about 4.7 cm. No pneumothorax.  IMPRESSION: No active disease.  Right IJ central line with tip in right atrium.   Electronically Signed   By: Natasha Mead M.D.   On: 01/17/2015 17:22   Dg Chest Portable 1 View  01/17/2015   CLINICAL DATA:  Altered mental status, fall, hyperglycemia  EXAM: PORTABLE CHEST - 1 VIEW  COMPARISON:  12/31/2014  FINDINGS: Borderline cardiomegaly. Status post median sternotomy. Again noted status post aortic valve replacement. No acute infiltrate or pleural effusion. No pulmonary edema.  IMPRESSION: Borderline cardiomegaly.  No active disease.   Electronically Signed   By: Natasha Mead M.D.   On: 01/17/2015 15:40   Dg Chest Portable 1 View  12/31/2014   CLINICAL DATA:  Hyperglycemia  EXAM: PORTABLE CHEST - 1 VIEW  COMPARISON:  December 05, 2014  FINDINGS: Lungs are clear. Heart size and pulmonary vascularity are normal. No adenopathy. Patient is status post aortic valve replacement.  IMPRESSION: No edema or consolidation.   Electronically Signed   By: Bretta Bang III M.D.   On: 12/31/2014 20:11    Microbiology: Recent Results (from the past 240 hour(s))  Blood culture (routine x 2)     Status: None   Collection Time: 01/17/15  3:39 PM  Result Value Ref Range Status   Specimen Description BLOOD RIGHT WRIST  Final   Special Requests BOTTLES DRAWN AEROBIC ONLY 1CC  Final   Culture   Final    NO GROWTH 5 DAYS Performed at [facility]     Report Status 01/23/2015 FINAL  Final  Blood culture (routine x 2)  Status: None   Collection Time: 01/17/15  3:45 PM  Result Value Ref Range Status   Specimen Description BLOOD ARM RIGHT  Final   Special Requests   Final    BOTTLES DRAWN AEROBIC AND ANAEROBIC 5CCBLUE 2.5CCRED   Culture   Final    NO GROWTH 5 DAYS Performed at [facility]    Report Status 01/23/2015 FINAL  Final  Urine culture     Status: None   Collection Time: 01/17/15  6:27 PM  Result Value Ref Range Status   Specimen Description URINE, CATHETERIZED  Final   Special Requests NONE  Final   Colony Count NO GROWTH Performed at [facility]   Final   Culture NO GROWTH Performed at [facility]   Final   Report Status 01/19/2015 FINAL  Final  MRSA PCR Screening     Status: None   Collection Time: 01/17/15  7:51 PM  Result Value Ref Range Status   MRSA by PCR NEGATIVE NEGATIVE Final    Comment:        The GeneXpert MRSA Assay (FDA approved for NASAL specimens only), is one component of a comprehensive MRSA colonization surveillance program. It is not intended to diagnose MRSA infection nor to guide or monitor treatment for MRSA infections.      Labs: Basic Metabolic Panel:  Recent Labs Lab 01/18/15 0500  01/20/15 0515 01/21/15 0508 01/22/15 0407 01/23/15 0530 01/24/15 0853  NA 146*  < > 141 144 139 134* 135  K 3.4*  < > 3.9 3.9 4.1 4.5 3.3*  CL 119*  < > 112 114* 110 109 109  CO2 18*  < > 25 20 24 22 23   GLUCOSE 205*  < > 76 29* 73 320* 136*  BUN 27*  < > <5* <5* <5* 5* 9  CREATININE 1.39*  < > 0.76 0.63 0.64 0.71 0.72  CALCIUM 7.6*  < > 8.3* 8.7 8.8 7.9* 8.8  MG 1.7  --   --   --   --   --   --   PHOS 1.5*  --   --   --   --   --   --   < > = values in this interval not displayed. Liver Function Tests:  Recent Labs Lab 01/17/15 1840  AST 19  ALT 17  ALKPHOS 75  BILITOT 1.7*  PROT 5.2*  ALBUMIN 2.7*    Recent Labs Lab 01/18/15 0920  LIPASE 37    No results for input(s): AMMONIA in the last 168 hours. CBC:  Recent Labs Lab 01/20/15 0515 01/21/15 0508 01/22/15 0407 01/23/15 0530 01/24/15 0853  WBC 7.6 6.5 7.0 8.5 7.1  HGB 8.5* 9.7* 9.9* 9.2* 10.1*  HCT 27.3* 31.2* 31.7* 29.7* 31.9*  MCV 83.5 82.3 82.8 83.2 81.2  PLT 232 213 221 229 275   Cardiac Enzymes:  Recent Labs Lab 01/18/15 0920 01/18/15 1530 01/18/15 2130 01/19/15 0330 01/24/15 0853  TROPONINI 1.00* 0.97* 0.73* 0.53* <0.03   BNP: BNP (last 3 results) No results for input(s): BNP in the last 8760 hours.  ProBNP (last 3 results)  Recent Labs  09/30/14 1431  PROBNP 794.3*    CBG:  Recent Labs Lab 01/24/15 0012 01/24/15 0330 01/24/15 0443 01/24/15 0753 01/24/15 1150  GLUCAP 278* 319* 381* 262* 84    Signed:  Bearl Talarico K  [facility] 01/24/2015, 4:09 PM

## 2015-01-24 NOTE — Progress Notes (Signed)
NURSING PROGRESS NOTE  Madison [name] [id number] Discharge Data: 01/24/2015 5:27 PM Attending Provider: Jerald Kief, MD LKG:[id number],GUYQI W, MD     Mirian Capuchin to be D/C'd Home per MD order.  Discussed with the patient the After Visit Summary and all questions fully answered. All IV's discontinued with no bleeding noted. All belongings returned to patient for patient to take home.   Last Vital Signs:  Blood pressure 94/48, pulse 97, temperature 99.2 F (37.3 C), temperature source Oral, resp. rate 18, height 5\' 3"  (1.6 m), weight 77.2 kg (170 lb 3.1 oz), SpO2 100 %.  Discharge Medication List   Medication List    STOP taking these medications        lisinopril 2.5 MG tablet  Commonly known as:  PRINIVIL,ZESTRIL     traMADol 50 MG tablet  Commonly known as:  ULTRAM     zolpidem 10 MG tablet  Commonly known as:  AMBIEN      TAKE these medications        aspirin 81 MG tablet  Take 1 tablet (81 mg total) by mouth daily.     baclofen 10 MG tablet  Commonly known as:  LIORESAL  Take 1 tablet (10 mg total) by mouth 2 (two) times daily.     clopidogrel 75 MG tablet  Commonly known as:  PLAVIX  Take 1 tablet (75 mg total) by mouth daily with breakfast.     enoxaparin 80 MG/0.8ML injection  Commonly known as:  LOVENOX  Inject 0.8 mLs (80 mg total) into the skin every 12 (twelve) hours.  Start taking on:  01/25/2015     escitalopram 20 MG tablet  Commonly known as:  LEXAPRO  Take 20 mg by mouth daily.     feeding supplement (GLUCERNA SHAKE) Liqd  Take 237 mLs by mouth 2 (two) times daily between meals.     glucose blood test strip  Commonly known as:  ACCU-CHEK AVIVA  Use to check blood sugar 5 times per day Dx Code E10.10 And lancets 5/day     insulin aspart 100 UNIT/ML injection  Commonly known as:  novoLOG  Inject 2 Units into the skin 3 (three) times daily with meals.     Insulin Detemir 100 UNIT/ML Pen  Commonly known as:  LEVEMIR FLEXTOUCH  Inject 15  Units into the skin daily.     insulin lispro 100 UNIT/ML KiwkPen  Commonly known as:  HUMALOG KWIKPEN  Inject 6 units with evening meal     methotrexate 2.5 MG tablet  Commonly known as:  RHEUMATREX  Take 1 tablet (2.5 mg total) by mouth 2 (two) times a week. Caution:Chemotherapy. Protect from light.     metoprolol tartrate 25 MG tablet  Commonly known as:  LOPRESSOR  Take 0.5 tablets (12.5 mg total) by mouth 2 (two) times daily.     omeprazole 40 MG capsule  Commonly known as:  PRILOSEC  TAKE 1 CAPSULE (40 MG TOTAL) BY MOUTH DAILY.     pravastatin 20 MG tablet  Commonly known as:  PRAVACHOL  TAKE 1 TABLET EVERY DAY     promethazine 25 MG tablet  Commonly known as:  PHENERGAN  Take 1 tablet (25 mg total) by mouth every 4 (four) hours as needed for nausea or vomiting.     solifenacin 5 MG tablet  Commonly known as:  VESICARE  Take 0.5 tablets (2.5 mg total) by mouth daily.     warfarin 5 MG tablet  Commonly  known as:  COUMADIN  Take 1-1.5 tablets (5-7.5 mg total) by mouth daily at 6 PM. Take 5 mg Daily By Mouth on , Tues, Thurs, Fri, Sat & Sun, Take 7.5 mg By Mouth on mondays and Wednesdays .

## 2015-01-25 MED ORDER — INSULIN ASPART 100 UNIT/ML ~~LOC~~ SOLN: 2.0000 [IU] | Freq: Three times a day (TID) | SUBCUTANEOUS | Status: DC

## 2015-01-25 MED ORDER — INSULIN DETEMIR 100 UNIT/ML FLEXPEN: 15.0000 [IU] | PEN_INJECTOR | Freq: Every day | SUBCUTANEOUS | Status: DC

## 2015-01-25 MED ORDER — ENOXAPARIN SODIUM 80 MG/0.8ML ~~LOC~~ SOLN: 80.0000 mg | Freq: Two times a day (BID) | SUBCUTANEOUS | Status: DC

## 2015-01-26 DIAGNOSIS — E161 Other hypoglycemia: Secondary | ICD-10-CM | POA: Diagnosis not present

## 2015-01-26 DIAGNOSIS — Z7901 Long term (current) use of anticoagulants: Secondary | ICD-10-CM | POA: Diagnosis not present

## 2015-01-26 DIAGNOSIS — E1065 Type 1 diabetes mellitus with hyperglycemia: Secondary | ICD-10-CM | POA: Diagnosis not present

## 2015-01-26 DIAGNOSIS — B3781 Candidal esophagitis: Secondary | ICD-10-CM | POA: Diagnosis not present

## 2015-01-26 DIAGNOSIS — M069 Rheumatoid arthritis, unspecified: Secondary | ICD-10-CM | POA: Diagnosis not present

## 2015-01-26 DIAGNOSIS — M6281 Muscle weakness (generalized): Secondary | ICD-10-CM | POA: Diagnosis not present

## 2015-01-26 DIAGNOSIS — F0390 Unspecified dementia without behavioral disturbance: Secondary | ICD-10-CM | POA: Diagnosis not present

## 2015-01-26 DIAGNOSIS — W19XXXD Unspecified fall, subsequent encounter: Secondary | ICD-10-CM | POA: Diagnosis not present

## 2015-01-27 ENCOUNTER — Telehealth: Payer: Self-pay | Admitting: Family

## 2015-01-27 DIAGNOSIS — M069 Rheumatoid arthritis, unspecified: Secondary | ICD-10-CM | POA: Diagnosis not present

## 2015-01-27 DIAGNOSIS — Z7901 Long term (current) use of anticoagulants: Secondary | ICD-10-CM | POA: Diagnosis not present

## 2015-01-27 DIAGNOSIS — E1065 Type 1 diabetes mellitus with hyperglycemia: Secondary | ICD-10-CM | POA: Diagnosis not present

## 2015-01-27 DIAGNOSIS — F0390 Unspecified dementia without behavioral disturbance: Secondary | ICD-10-CM | POA: Diagnosis not present

## 2015-01-27 DIAGNOSIS — M6281 Muscle weakness (generalized): Secondary | ICD-10-CM | POA: Diagnosis not present

## 2015-01-27 DIAGNOSIS — W19XXXD Unspecified fall, subsequent encounter: Secondary | ICD-10-CM | POA: Diagnosis not present

## 2015-01-27 DIAGNOSIS — B3781 Candidal esophagitis: Secondary | ICD-10-CM | POA: Diagnosis not present

## 2015-01-27 NOTE — Telephone Encounter (Signed)
Heather from [facility] called to ask when they should do her INR. Pt was release from the hospital on 01/26/15  Phone number [phone]

## 2015-01-28 DIAGNOSIS — Z7901 Long term (current) use of anticoagulants: Secondary | ICD-10-CM | POA: Diagnosis not present

## 2015-01-28 DIAGNOSIS — E1065 Type 1 diabetes mellitus with hyperglycemia: Secondary | ICD-10-CM | POA: Diagnosis not present

## 2015-01-28 DIAGNOSIS — W19XXXD Unspecified fall, subsequent encounter: Secondary | ICD-10-CM | POA: Diagnosis not present

## 2015-01-28 DIAGNOSIS — F0390 Unspecified dementia without behavioral disturbance: Secondary | ICD-10-CM | POA: Diagnosis not present

## 2015-01-28 DIAGNOSIS — M069 Rheumatoid arthritis, unspecified: Secondary | ICD-10-CM | POA: Diagnosis not present

## 2015-01-28 DIAGNOSIS — M6281 Muscle weakness (generalized): Secondary | ICD-10-CM | POA: Diagnosis not present

## 2015-01-28 DIAGNOSIS — B3781 Candidal esophagitis: Secondary | ICD-10-CM | POA: Diagnosis not present

## 2015-01-28 NOTE — Telephone Encounter (Signed)
Check INR within the next 2-3 days.

## 2015-01-28 NOTE — Telephone Encounter (Signed)
Left a message for return call.  

## 2015-01-28 NOTE — Telephone Encounter (Signed)
Left a detailed message for Heather.

## 2015-01-31 ENCOUNTER — Ambulatory Visit: Payer: Commercial Managed Care - HMO | Admitting: General Practice

## 2015-01-31 ENCOUNTER — Ambulatory Visit (INDEPENDENT_AMBULATORY_CARE_PROVIDER_SITE_OTHER): Payer: Commercial Managed Care - HMO | Admitting: Family Medicine

## 2015-01-31 ENCOUNTER — Telehealth: Payer: Self-pay | Admitting: General Practice

## 2015-01-31 ENCOUNTER — Encounter: Payer: Self-pay | Admitting: Endocrinology

## 2015-01-31 ENCOUNTER — Ambulatory Visit (INDEPENDENT_AMBULATORY_CARE_PROVIDER_SITE_OTHER): Payer: Commercial Managed Care - HMO | Admitting: Endocrinology

## 2015-01-31 ENCOUNTER — Encounter: Payer: Self-pay | Admitting: Family Medicine

## 2015-01-31 VITALS — BP 100/62 | HR 78 | Temp 98.5°F | Wt 154.0 lb

## 2015-01-31 VITALS — BP 126/62 | HR 72 | Temp 98.0°F | Resp 14 | Wt 155.8 lb

## 2015-01-31 DIAGNOSIS — Z954 Presence of other heart-valve replacement: Secondary | ICD-10-CM | POA: Diagnosis not present

## 2015-01-31 DIAGNOSIS — R479 Unspecified speech disturbances: Secondary | ICD-10-CM | POA: Diagnosis not present

## 2015-01-31 DIAGNOSIS — M069 Rheumatoid arthritis, unspecified: Secondary | ICD-10-CM | POA: Diagnosis not present

## 2015-01-31 DIAGNOSIS — W19XXXD Unspecified fall, subsequent encounter: Secondary | ICD-10-CM | POA: Diagnosis not present

## 2015-01-31 DIAGNOSIS — Z7901 Long term (current) use of anticoagulants: Secondary | ICD-10-CM | POA: Diagnosis not present

## 2015-01-31 DIAGNOSIS — E876 Hypokalemia: Secondary | ICD-10-CM

## 2015-01-31 DIAGNOSIS — R1310 Dysphagia, unspecified: Secondary | ICD-10-CM

## 2015-01-31 DIAGNOSIS — E1065 Type 1 diabetes mellitus with hyperglycemia: Secondary | ICD-10-CM | POA: Diagnosis not present

## 2015-01-31 DIAGNOSIS — E1049 Type 1 diabetes mellitus with other diabetic neurological complication: Secondary | ICD-10-CM

## 2015-01-31 DIAGNOSIS — B3781 Candidal esophagitis: Secondary | ICD-10-CM | POA: Diagnosis not present

## 2015-01-31 DIAGNOSIS — Z952 Presence of prosthetic heart valve: Secondary | ICD-10-CM

## 2015-01-31 DIAGNOSIS — F0390 Unspecified dementia without behavioral disturbance: Secondary | ICD-10-CM | POA: Diagnosis not present

## 2015-01-31 DIAGNOSIS — M6281 Muscle weakness (generalized): Secondary | ICD-10-CM | POA: Diagnosis not present

## 2015-01-31 DIAGNOSIS — D649 Anemia, unspecified: Secondary | ICD-10-CM

## 2015-01-31 LAB — CBC WITH DIFFERENTIAL/PLATELET
BASOS PCT: 0.5 % (ref 0.0–3.0)
Basophils Absolute: 0 10*3/uL (ref 0.0–0.1)
Eosinophils Absolute: 0.1 10*3/uL (ref 0.0–0.7)
Eosinophils Relative: 1.1 % (ref 0.0–5.0)
HCT: 31.5 % — ABNORMAL LOW (ref 36.0–46.0)
Hemoglobin: 10.2 g/dL — ABNORMAL LOW (ref 12.0–15.0)
LYMPHS PCT: 24.6 % (ref 12.0–46.0)
Lymphs Abs: 1.8 10*3/uL (ref 0.7–4.0)
MCHC: 32.2 g/dL (ref 30.0–36.0)
MCV: 79.8 fl (ref 78.0–100.0)
Monocytes Absolute: 0.3 10*3/uL (ref 0.1–1.0)
Monocytes Relative: 4.4 % (ref 3.0–12.0)
NEUTROS PCT: 69.4 % (ref 43.0–77.0)
Neutro Abs: 5.1 10*3/uL (ref 1.4–7.7)
PLATELETS: 512 10*3/uL — AB (ref 150.0–400.0)
RBC: 3.95 Mil/uL (ref 3.87–5.11)
RDW: 19.9 % — ABNORMAL HIGH (ref 11.5–15.5)
WBC: 7.4 10*3/uL (ref 4.0–10.5)

## 2015-01-31 LAB — BASIC METABOLIC PANEL
BUN: 16 mg/dL (ref 6–23)
CALCIUM: 9.3 mg/dL (ref 8.4–10.5)
CO2: 27 mEq/L (ref 19–32)
CREATININE: 0.86 mg/dL (ref 0.40–1.20)
Chloride: 103 mEq/L (ref 96–112)
GFR: 73.05 mL/min (ref >60.00)
Glucose, Bld: 112 mg/dL — ABNORMAL HIGH (ref 70–99)
Potassium: 4.5 mEq/L (ref 3.5–5.1)
Sodium: 139 mEq/L (ref 135–145)

## 2015-01-31 LAB — POCT INR: INR: 7.8

## 2015-01-31 MED ORDER — INSULIN DETEMIR 100 UNIT/ML FLEXPEN: 35.0000 [IU] | PEN_INJECTOR | SUBCUTANEOUS | Status: DC

## 2015-01-31 MED ORDER — INSULIN ASPART 100 UNIT/ML ~~LOC~~ SOLN: 2.0000 [IU] | Freq: Three times a day (TID) | SUBCUTANEOUS | Status: DC

## 2015-01-31 NOTE — Telephone Encounter (Signed)
Line busy

## 2015-01-31 NOTE — Progress Notes (Signed)
Subjective:    Patient ID: Madison [name], female    DOB: 1961/10/21, 54 y.o.   MRN: [id number]  HPI Pt returns for f/u of diabetes mellitus: DM type: Insulin-requiring type 2 Dx'ed: 1981 Complications: polyneuropathy, retinopathy, CVA's, and CAD.   Therapy: insulin since dx GDM: never DKA: last episode was in 2016 Severe hypoglycemia: last episode was in 2014. Pancreatitis: never Other: she was on insulin pump rx until CVA's prevented her from using the device in early 2014; in early 2015, she was changed to a simple insulin schedule, due to poor results with multiple daily injections.   Interval history: She takes levemir 15 units qd, and novolog 6 units 3 times a day (just before each meal).  She brings her meter.  cbg's vary from 162-309.  It is in general higher as the day goes on.  Pt says the care of her DM is being compromised by a child custody dispute with her ex-husband.   Past Medical History  Diagnosis Date  . CAD (coronary artery disease)   . Obesity   . Hypercholesteremia   . HTN (hypertension)   . Dyslipidemia   . Aortic stenosis   . Thrombophlebitis   . Heart murmur   . Myocardial infarction 12/2014  . Pneumonia 11/2014; 12/2014  . Diabetes dx'd 1982    "went straight to insulin"  . GERD (gastroesophageal reflux disease)   . 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 Rheumatoid arthritis(714.0)     "hands" (01/19/2015)  . Anxiety   . Depression     Past Surgical History  Procedure Laterality Date  . Cesarean section  1995  . Foot fracture surgery    . Knee arthroscopy Right   . Neuroplasty / transposition median nerve at carpal tunnel    . Left heart catheterization with coronary angiogram N/A 12/08/2014    Procedure: LEFT HEART CATHETERIZATION WITH CORONARY ANGIOGRAM;  Surgeon: Iran Ouch, MD;  Location: [facility] CATH LAB;  Service: Cardiovascular;  Laterality: N/A;  . Percutaneous coronary  stent intervention (pci-s) N/A 12/09/2014    Procedure: PERCUTANEOUS CORONARY STENT INTERVENTION (PCI-S);  Surgeon: [name], MD;  Location: [facility] CATH LAB;  Service: Cardiovascular;  Laterality: N/A;  . Cardiac catheterization  12/08/2014  . Coronary angioplasty with stent placement  12/09/2014  . Aortic valve replacement (avr)/coronary artery bypass grafting (cabg)  "2014"    [name] 03/08/2014  . Fracture surgery    . Tubal ligation  1995  . Cataract extraction Left     History   Social History  . Marital Status: Divorced    Spouse Name: N/A  . Number of Children: N/A  . Years of Education: N/A   Occupational History  . Not on file.   Social History Main Topics  . Smoking status: Never Smoker   . Smokeless tobacco: Never Used  . Alcohol Use: No  . Drug Use: No  . Sexual Activity: Not on file   Other Topics Concern  . Not on file   Social History Narrative    Current Outpatient Prescriptions on File Prior to Visit  Medication Sig Dispense Refill  . aspirin 81 MG tablet Take 1 tablet (81 mg total) by mouth daily. 30 tablet   . baclofen (LIORESAL) 10 MG tablet Take 1 tablet (10 mg total) by mouth 2 (two) times daily. 60 each 1  . clopidogrel (PLAVIX) 75 MG tablet Take  1 tablet (75 mg total) by mouth daily with breakfast. 30 tablet 2  . enoxaparin (LOVENOX) 80 MG/0.8ML injection Inject 0.8 mLs (80 mg total) into the skin every 12 (twelve) hours. 10 Syringe 0  . escitalopram (LEXAPRO) 20 MG tablet Take 20 mg by mouth daily.    . feeding supplement, GLUCERNA SHAKE, (GLUCERNA SHAKE) LIQD Take 237 mLs by mouth 2 (two) times daily between meals. 60 Can 0  . glucose blood (ACCU-CHEK AVIVA) test strip Use to check blood sugar 5 times per day Dx Code E10.10 And lancets 5/day 150 each 11  . methotrexate (RHEUMATREX) 2.5 MG tablet Take 1 tablet (2.5 mg total) by mouth 2 (two) times a week. Caution:Chemotherapy. Protect from light. 4 tablet 0  . metoprolol tartrate (LOPRESSOR) 25 MG  tablet Take 0.5 tablets (12.5 mg total) by mouth 2 (two) times daily. (Patient taking differently: Take 25 mg by mouth daily. ) 60 tablet 0  . omeprazole (PRILOSEC) 40 MG capsule TAKE 1 CAPSULE (40 MG TOTAL) BY MOUTH DAILY. 30 capsule 6  . pravastatin (PRAVACHOL) 20 MG tablet TAKE 1 TABLET EVERY DAY 90 tablet 0  . promethazine (PHENERGAN) 25 MG tablet Take 1 tablet (25 mg total) by mouth every 4 (four) hours as needed for nausea or vomiting. 60 tablet 0  . solifenacin (VESICARE) 5 MG tablet Take 0.5 tablets (2.5 mg total) by mouth daily. (Patient taking differently: Take 5 mg by mouth daily. ) 45 tablet 1  . warfarin (COUMADIN) 5 MG tablet Take 1-1.5 tablets (5-7.5 mg total) by mouth daily at 6 PM. Take 5 mg Daily By Mouth on , Tues, Thurs, Fri, Sat & Sun, Take 7.5 mg By Mouth on mondays and Wednesdays . (Patient taking differently: Take 5-7.5 mg by mouth daily at 6 PM. Take 5 mg Daily By Mouth on Mon, Tues, Thurs, Fri, Sat & Sun, Take 7.5 mg By Mouth on Wednesdays .) 60 tablet 0   No current facility-administered medications on file prior to visit.    Allergies  Allergen Reactions  . Celebrex [Celecoxib] Rash  . Detrol [Tolterodine] Hives    Family History  Problem Relation Age of Onset  . Stroke    . Heart disease Mother   . Heart disease Sister     BP 126/62 mmHg  Pulse 72  Temp(Src) 98 F (36.7 C)  Resp 14  Wt 155 lb 12.8 oz (70.67 kg)  SpO2 98%  Review of Systems She denies hypoglycemia.  She has lost a few lbs    Objective:   Physical Exam VITAL SIGNS:  See vs page GENERAL: no distress Pulses: dorsalis pedis intact bilat.   MSK: no deformity of the feet CV: no leg edema Skin:  no ulcer on the feet.  normal color and temp on the feet. Neuro: sensation is intact to touch on the feet, but decreased from normal.     Lab Results  Component Value Date   HGBA1C 9.0* 01/01/2015      Assessment & Plan:  DM: she needs increased rx.   Social situation.  We discussed.   Pt says this will improve with time.  However, she needs the simplest possible insulin regimen. DKA: resolved, but this has been recurrent.    Patient is advised the following: Patient Instructions  check your blood sugar twice a day.  vary the time of day when you check, between before the 3 meals, and at bedtime.  also check if you have symptoms of your blood sugar  being too high or too low.  please keep a record of the readings and bring it to your next appointment here.  You can write it on any piece of paper.  please call us sooner if your blood sugar goes below 70, or if you have a lot of readings over 200.    On this type of insulin schedule, you should eat meals on a regular schedule.  If a meal is missed or significantly delayed, your blood sugar could go low. Please come back for a follow-up appointment in 1 week.     Please see Bonita Quin the same day, to go over sick-day management.   Please increase the levemir to 35 units each morning.   Take novolog, only when your blood sugar is high (2 units for any blood sugar over 250).

## 2015-01-31 NOTE — Telephone Encounter (Signed)
LMOVM

## 2015-01-31 NOTE — Progress Notes (Signed)
Pre visit review using our clinic review tool, if applicable. No additional management support is needed unless otherwise documented below in the visit note. 

## 2015-01-31 NOTE — Progress Notes (Signed)
Subjective:    Patient ID: Madison [name], female    DOB: [birth year], 54 y.o.   MRN: [id number]  HPI Patient seen for hospital follow-up. She is yet to establish with new primary provider. She was admitted on 01/17/2015 and discharged on 01/24/2015. She has history of diabetes and was admitted with diabetic ketoacidosis and hemodynamic shock with acute renal failure. She had super therapeutic INR with 6.14. She is on Coumadin with history of mechanical heart valve.  Her hemodynamic shock resolved with fluid support. Acute renal failure resolved. She has chronic anemia which is near baseline. She was discharged on Lovenox with INR 2.1 with goal of 2.5-3.5. She states she is only taking Coumadin (not with Lovenox) at this time. She needs follow-up INR today. She had hypotension on admission and apparently was discharged on just metoprolol instructed to hold ACE inhibitor though she apparently is taking her ACE inhibitor at this time. She denies any orthostasis.  Diabetes with hx of extreme fluctuations. Her insulin was reduced to Levemir 10 units once daily with 3 units of meal coverage but she had some glucoses in the 300 range. She states she is now taking Levemir 15 units once daily and 6 units of short-acting insulin with meals. She has follow-up with endocrine later today.  She has complaint of pain with swallowing over the past few weeks. She states this was mentioned during her hospitalization. She has not had any true dysphasia. She has history of GERD and takes omeprazole regularly. She has had some recent mild weight loss but states appetite is stable. No recent thrush. Her pain is lower esophageal region.  [facility]. She has had issues following stroke years ago  Past Medical History  Diagnosis Date  . CAD (coronary artery disease)   . Obesity   . Hypercholesteremia   . HTN (hypertension)   . Dyslipidemia   . Aortic stenosis     . Thrombophlebitis   . Heart murmur   . Myocardial infarction 12/2014  . Pneumonia 11/2014; 12/2014  . Diabetes dx'd 1982    "went straight to insulin"  . GERD (gastroesophageal reflux disease)   . 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 Rheumatoid arthritis(714.0)     "hands" (01/19/2015)  . Anxiety   . Depression    Past Surgical History  Procedure Laterality Date  . Cesarean section  1995  . Foot fracture surgery    . Knee arthroscopy Right   . Neuroplasty / transposition median nerve at carpal tunnel    . Left heart catheterization with coronary angiogram N/A 12/08/2014    Procedure: LEFT HEART CATHETERIZATION WITH CORONARY ANGIOGRAM;  Surgeon: Iran Ouch, MD;  Location: [facility] CATH LAB;  Service: Cardiovascular;  Laterality: N/A;  . Percutaneous coronary stent intervention (pci-s) N/A 12/09/2014    Procedure: PERCUTANEOUS CORONARY STENT INTERVENTION (PCI-S);  Surgeon: [name], MD;  Location: [facility] CATH LAB;  Service: Cardiovascular;  Laterality: N/A;  . Cardiac catheterization  12/08/2014  . Coronary angioplasty with stent placement  12/09/2014  . Aortic valve replacement (avr)/coronary artery bypass grafting (cabg)  "2014"    [name] 03/08/2014  . Fracture surgery    . Tubal ligation  1995  . Cataract extraction Left     reports that she has never smoked. She has never used smokeless tobacco. She reports that she does not drink  alcohol or use illicit drugs. family history includes Heart disease in her mother and sister; Stroke in an other family member. Allergies  Allergen Reactions  . Celebrex [Celecoxib] Rash  . Detrol [Tolterodine] Hives      Review of Systems  Constitutional: Positive for unexpected weight change. Negative for fever, chills, appetite change and fatigue.  HENT: Positive for trouble swallowing. Negative for voice change.   Eyes: Negative for visual disturbance.  Respiratory:  Negative for cough, chest tightness, shortness of breath and wheezing.   Cardiovascular: Negative for chest pain, palpitations and leg swelling.  Gastrointestinal: Negative for abdominal pain.  Genitourinary: Negative for dysuria.  Neurological: Negative for dizziness, seizures, syncope, weakness, light-headedness and headaches.       Objective:   Physical Exam  Constitutional: She is oriented to person, place, and time. She appears well-developed and well-nourished.  HENT:  Mouth/Throat: Oropharynx is clear and moist.  Neck: Neck supple. No JVD present.  Cardiovascular: Normal rate and regular rhythm.   Pulmonary/Chest: Effort normal and breath sounds normal. No respiratory distress. She has no wheezes. She has no rales.  Musculoskeletal: She exhibits no edema.  Neurological: She is alert and oriented to person, place, and time.          Assessment & Plan:  #1 history of type 1 diabetes with recent ketoacidosis episode. Continue follow-up with endocrinology later today #2 recent mild hypokalemia during hospitalization. Recheck basic metabolic panel #3 chronic Coumadin with mechanical heart valve replacement. Recent super -therapeutic INR. Recheck INR today #4 odynophagia. She denies any dysphagia. She has had some recent mild weight loss. No history of thrush. She does have a long history of GERD. Endoscopy would be increased risk with Coumadin therapy. Start with barium swallow. #5 speech impediment. History of stroke. Set up home speech therapy

## 2015-01-31 NOTE — Patient Instructions (Addendum)
check your blood sugar twice a day.  vary the time of day when you check, between before the 3 meals, and at bedtime.  also check if you have symptoms of your blood sugar being too high or too low.  please keep a record of the readings and bring it to your next appointment here.  You can write it on any piece of paper.  please call us sooner if your blood sugar goes below 70, or if you have a lot of readings over 200.    On this type of insulin schedule, you should eat meals on a regular schedule.  If a meal is missed or significantly delayed, your blood sugar could go low. Please come back for a follow-up appointment in 1 week.     Please see Madison [name] the same day, to go over sick-day management.   Please increase the levemir to 35 units each morning.   Take novolog, only when your blood sugar is high (2 units for any blood sugar over 250).

## 2015-02-01 ENCOUNTER — Telehealth: Payer: Self-pay | Admitting: Family

## 2015-02-01 DIAGNOSIS — E1065 Type 1 diabetes mellitus with hyperglycemia: Secondary | ICD-10-CM | POA: Diagnosis not present

## 2015-02-01 DIAGNOSIS — M6281 Muscle weakness (generalized): Secondary | ICD-10-CM | POA: Diagnosis not present

## 2015-02-01 DIAGNOSIS — F0390 Unspecified dementia without behavioral disturbance: Secondary | ICD-10-CM | POA: Diagnosis not present

## 2015-02-01 DIAGNOSIS — W19XXXD Unspecified fall, subsequent encounter: Secondary | ICD-10-CM | POA: Diagnosis not present

## 2015-02-01 DIAGNOSIS — Z7901 Long term (current) use of anticoagulants: Secondary | ICD-10-CM | POA: Diagnosis not present

## 2015-02-01 DIAGNOSIS — M069 Rheumatoid arthritis, unspecified: Secondary | ICD-10-CM | POA: Diagnosis not present

## 2015-02-01 DIAGNOSIS — B3781 Candidal esophagitis: Secondary | ICD-10-CM | POA: Diagnosis not present

## 2015-02-01 NOTE — Telephone Encounter (Signed)
OK to set up 

## 2015-02-01 NOTE — Telephone Encounter (Signed)
Left verbal order on Madison [name]'s cell phone

## 2015-02-01 NOTE — Telephone Encounter (Signed)
Cheral Almas  from [facility] called to request verbal orders for phydical therapy at home    [phone]

## 2015-02-01 NOTE — Telephone Encounter (Signed)
Line busy

## 2015-02-02 ENCOUNTER — Telehealth: Payer: Self-pay | Admitting: Family

## 2015-02-02 ENCOUNTER — Telehealth (HOSPITAL_COMMUNITY): Payer: Self-pay | Admitting: Cardiac Rehabilitation

## 2015-02-02 NOTE — Telephone Encounter (Signed)
pc to enroll in cardiac rehab per Dr. [place] order. Phone not in service. Letter mailed to pt home address.

## 2015-02-02 NOTE — Telephone Encounter (Signed)
Ok per [place] to use order form [facility].  Called and left a message for Candee Furbish making aware.

## 2015-02-02 NOTE — Telephone Encounter (Signed)
Darl Pikes from [facility] called regarding patient consultation for speech. The consultation was ok through [facility]. Darl Pikes wanted to know if they could use the order that already been ok'ed for [facility]. If you have any further question Darl Pikes can be reached at [phone] ext [phone] and if a new order has to be done it can be faxed to the attention of Candee Furbish at [phone].

## 2015-02-03 ENCOUNTER — Ambulatory Visit (INDEPENDENT_AMBULATORY_CARE_PROVIDER_SITE_OTHER): Payer: Commercial Managed Care - HMO | Admitting: General Practice

## 2015-02-03 DIAGNOSIS — I5043 Acute on chronic combined systolic (congestive) and diastolic (congestive) heart failure: Secondary | ICD-10-CM | POA: Diagnosis not present

## 2015-02-03 DIAGNOSIS — I10 Essential (primary) hypertension: Secondary | ICD-10-CM | POA: Diagnosis not present

## 2015-02-03 DIAGNOSIS — M069 Rheumatoid arthritis, unspecified: Secondary | ICD-10-CM | POA: Diagnosis not present

## 2015-02-03 DIAGNOSIS — E1065 Type 1 diabetes mellitus with hyperglycemia: Secondary | ICD-10-CM | POA: Diagnosis not present

## 2015-02-03 DIAGNOSIS — I69354 Hemiplegia and hemiparesis following cerebral infarction affecting left non-dominant side: Secondary | ICD-10-CM | POA: Diagnosis not present

## 2015-02-03 DIAGNOSIS — E1049 Type 1 diabetes mellitus with other diabetic neurological complication: Secondary | ICD-10-CM | POA: Diagnosis not present

## 2015-02-03 DIAGNOSIS — Z954 Presence of other heart-valve replacement: Secondary | ICD-10-CM

## 2015-02-03 DIAGNOSIS — I252 Old myocardial infarction: Secondary | ICD-10-CM | POA: Diagnosis not present

## 2015-02-03 DIAGNOSIS — F0390 Unspecified dementia without behavioral disturbance: Secondary | ICD-10-CM | POA: Diagnosis not present

## 2015-02-03 DIAGNOSIS — Z952 Presence of prosthetic heart valve: Secondary | ICD-10-CM

## 2015-02-03 DIAGNOSIS — I2510 Atherosclerotic heart disease of native coronary artery without angina pectoris: Secondary | ICD-10-CM | POA: Diagnosis not present

## 2015-02-03 LAB — POCT INR: INR: 2.2

## 2015-02-03 NOTE — Progress Notes (Signed)
Pre visit review using our clinic review tool, if applicable. No additional management support is needed unless otherwise documented below in the visit note. 

## 2015-02-07 DIAGNOSIS — E1049 Type 1 diabetes mellitus with other diabetic neurological complication: Secondary | ICD-10-CM | POA: Diagnosis not present

## 2015-02-07 DIAGNOSIS — I69354 Hemiplegia and hemiparesis following cerebral infarction affecting left non-dominant side: Secondary | ICD-10-CM | POA: Diagnosis not present

## 2015-02-07 DIAGNOSIS — F0390 Unspecified dementia without behavioral disturbance: Secondary | ICD-10-CM | POA: Diagnosis not present

## 2015-02-07 DIAGNOSIS — I2510 Atherosclerotic heart disease of native coronary artery without angina pectoris: Secondary | ICD-10-CM | POA: Diagnosis not present

## 2015-02-07 DIAGNOSIS — M069 Rheumatoid arthritis, unspecified: Secondary | ICD-10-CM | POA: Diagnosis not present

## 2015-02-07 DIAGNOSIS — I5043 Acute on chronic combined systolic (congestive) and diastolic (congestive) heart failure: Secondary | ICD-10-CM | POA: Diagnosis not present

## 2015-02-07 DIAGNOSIS — E1065 Type 1 diabetes mellitus with hyperglycemia: Secondary | ICD-10-CM | POA: Diagnosis not present

## 2015-02-07 DIAGNOSIS — I10 Essential (primary) hypertension: Secondary | ICD-10-CM | POA: Diagnosis not present

## 2015-02-07 DIAGNOSIS — I252 Old myocardial infarction: Secondary | ICD-10-CM | POA: Diagnosis not present

## 2015-02-08 ENCOUNTER — Telehealth: Payer: Self-pay | Admitting: Endocrinology

## 2015-02-08 ENCOUNTER — Encounter: Payer: Self-pay | Admitting: Endocrinology

## 2015-02-08 ENCOUNTER — Ambulatory Visit (INDEPENDENT_AMBULATORY_CARE_PROVIDER_SITE_OTHER): Payer: Commercial Managed Care - HMO | Admitting: Endocrinology

## 2015-02-08 ENCOUNTER — Other Ambulatory Visit: Payer: Self-pay

## 2015-02-08 ENCOUNTER — Encounter: Payer: Commercial Managed Care - HMO | Attending: Internal Medicine | Admitting: Nutrition

## 2015-02-08 ENCOUNTER — Other Ambulatory Visit: Payer: Self-pay | Admitting: Internal Medicine

## 2015-02-08 VITALS — BP 116/54 | HR 101 | Temp 99.5°F | Ht 63.0 in | Wt 153.0 lb

## 2015-02-08 DIAGNOSIS — I69354 Hemiplegia and hemiparesis following cerebral infarction affecting left non-dominant side: Secondary | ICD-10-CM | POA: Diagnosis not present

## 2015-02-08 DIAGNOSIS — Z713 Dietary counseling and surveillance: Secondary | ICD-10-CM | POA: Insufficient documentation

## 2015-02-08 DIAGNOSIS — I5043 Acute on chronic combined systolic (congestive) and diastolic (congestive) heart failure: Secondary | ICD-10-CM | POA: Diagnosis not present

## 2015-02-08 DIAGNOSIS — F0390 Unspecified dementia without behavioral disturbance: Secondary | ICD-10-CM | POA: Diagnosis not present

## 2015-02-08 DIAGNOSIS — E1049 Type 1 diabetes mellitus with other diabetic neurological complication: Secondary | ICD-10-CM | POA: Insufficient documentation

## 2015-02-08 DIAGNOSIS — I252 Old myocardial infarction: Secondary | ICD-10-CM | POA: Diagnosis not present

## 2015-02-08 DIAGNOSIS — M069 Rheumatoid arthritis, unspecified: Secondary | ICD-10-CM | POA: Diagnosis not present

## 2015-02-08 DIAGNOSIS — Z794 Long term (current) use of insulin: Secondary | ICD-10-CM | POA: Insufficient documentation

## 2015-02-08 DIAGNOSIS — E1065 Type 1 diabetes mellitus with hyperglycemia: Secondary | ICD-10-CM | POA: Diagnosis not present

## 2015-02-08 DIAGNOSIS — I2510 Atherosclerotic heart disease of native coronary artery without angina pectoris: Secondary | ICD-10-CM | POA: Diagnosis not present

## 2015-02-08 DIAGNOSIS — I10 Essential (primary) hypertension: Secondary | ICD-10-CM | POA: Diagnosis not present

## 2015-02-08 MED ORDER — INSULIN ISOPHANE HUMAN 100 UNIT/ML KWIKPEN: 25.0000 [IU] | PEN_INJECTOR | SUBCUTANEOUS | Status: DC

## 2015-02-08 MED ORDER — GLUCAGON (RDNA) 1 MG IJ KIT: 1.0000 mg | PACK | Freq: Once | INTRAMUSCULAR | Status: DC | PRN

## 2015-02-08 NOTE — Telephone Encounter (Signed)
I Called pt and advised Glucagon emergency kit and Humulin N prescriptions have been sent to pt's pharmacy. I contacted pt's pharmacy to verify. Humulin N will be ready for pick up tomorrow due to pharmacy being out of stock of NPH insulin.

## 2015-02-08 NOTE — Telephone Encounter (Signed)
See note below and please advise, Thanks! 

## 2015-02-08 NOTE — Progress Notes (Signed)
Subjective:    Patient ID: Madison [name], female    DOB: 1961/07/18, 54 y.o.   MRN: [id number]  HPI Pt returns for f/u of diabetes mellitus: DM type: Insulin-requiring type 2 Dx'ed: 1981 Complications: polyneuropathy, retinopathy, CVA's, and CAD.   Therapy: insulin since dx.   GDM: never. DKA: last episode was in 2016.  Severe hypoglycemia: last episode was in 2014.   Pancreatitis: never.  Other: she was on insulin pump rx until CVA's prevented her from using the device in early 2014; in early 2015, she was changed to a simple insulin schedule, due to poor results with multiple daily injections.   Interval history: pt states she feels well in general. She brings her meter.  cbg's vary from 58-300.  It is in general higher as the day goes on.   Past Medical History  Diagnosis Date  . CAD (coronary artery disease)   . Obesity   . Hypercholesteremia   . HTN (hypertension)   . Dyslipidemia   . Aortic stenosis   . Thrombophlebitis   . Heart murmur   . Myocardial infarction 12/2014  . Pneumonia 11/2014; 12/2014  . Diabetes dx'd 1982    "went straight to insulin"  . GERD (gastroesophageal reflux disease)   . 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 Rheumatoid arthritis(714.0)     "hands" (01/19/2015)  . Anxiety   . Depression     Past Surgical History  Procedure Laterality Date  . Cesarean section  1995  . Foot fracture surgery    . Knee arthroscopy Right   . Neuroplasty / transposition median nerve at carpal tunnel    . Left heart catheterization with coronary angiogram N/A 12/08/2014    Procedure: LEFT HEART CATHETERIZATION WITH CORONARY ANGIOGRAM;  Surgeon: Iran Ouch, MD;  Location: [facility] CATH LAB;  Service: Cardiovascular;  Laterality: N/A;  . Percutaneous coronary stent intervention (pci-s) N/A 12/09/2014    Procedure: PERCUTANEOUS CORONARY STENT INTERVENTION (PCI-S);  Surgeon: [name], MD;   Location: [facility] CATH LAB;  Service: Cardiovascular;  Laterality: N/A;  . Cardiac catheterization  12/08/2014  . Coronary angioplasty with stent placement  12/09/2014  . Aortic valve replacement (avr)/coronary artery bypass grafting (cabg)  "2014"    [name] 03/08/2014  . Fracture surgery    . Tubal ligation  1995  . Cataract extraction Left     History   Social History  . Marital Status: Divorced    Spouse Name: N/A  . Number of Children: N/A  . Years of Education: N/A   Occupational History  . Not on file.   Social History Main Topics  . Smoking status: Never Smoker   . Smokeless tobacco: Never Used  . Alcohol Use: No  . Drug Use: No  . Sexual Activity: Not on file   Other Topics Concern  . Not on file   Social History Narrative    Current Outpatient Prescriptions on File Prior to Visit  Medication Sig Dispense Refill  . ACCU-CHEK SOFTCLIX LANCETS lancets     . aspirin 81 MG tablet Take 1 tablet (81 mg total) by mouth daily. 30 tablet   . baclofen (LIORESAL) 10 MG tablet Take 1 tablet (10 mg total) by mouth 2 (two) times daily. 60 each 1  . clopidogrel (PLAVIX) 75 MG tablet Take 1 tablet (75 mg total) by mouth daily with breakfast. 30 tablet 2  . enoxaparin (  LOVENOX) 80 MG/0.8ML injection Inject 0.8 mLs (80 mg total) into the skin every 12 (twelve) hours. 10 Syringe 0  . escitalopram (LEXAPRO) 20 MG tablet Take 20 mg by mouth daily.    . feeding supplement, GLUCERNA SHAKE, (GLUCERNA SHAKE) LIQD Take 237 mLs by mouth 2 (two) times daily between meals. 60 Can 0  . glucose blood (ACCU-CHEK AVIVA) test strip Use to check blood sugar 5 times per day Dx Code E10.10 And lancets 5/day 150 each 11  . insulin aspart (NOVOLOG) 100 UNIT/ML injection Inject 2 Units into the skin 3 (three) times daily with meals. As needed for any blood sugar over 250 10 mL 0  . lisinopril (PRINIVIL,ZESTRIL) 2.5 MG tablet Take 2.5 mg by mouth daily.  0  . methotrexate (RHEUMATREX) 2.5 MG tablet Take 1  tablet (2.5 mg total) by mouth 2 (two) times a week. Caution:Chemotherapy. Protect from light. 4 tablet 0  . metoprolol tartrate (LOPRESSOR) 25 MG tablet Take 0.5 tablets (12.5 mg total) by mouth 2 (two) times daily. (Patient taking differently: Take 25 mg by mouth daily. ) 60 tablet 0  . omeprazole (PRILOSEC) 40 MG capsule TAKE 1 CAPSULE (40 MG TOTAL) BY MOUTH DAILY. 30 capsule 6  . pravastatin (PRAVACHOL) 20 MG tablet TAKE 1 TABLET EVERY DAY 90 tablet 0  . promethazine (PHENERGAN) 25 MG tablet Take 1 tablet (25 mg total) by mouth every 4 (four) hours as needed for nausea or vomiting. 60 tablet 0  . solifenacin (VESICARE) 5 MG tablet Take 0.5 tablets (2.5 mg total) by mouth daily. (Patient taking differently: Take 5 mg by mouth daily. ) 45 tablet 1  . warfarin (COUMADIN) 5 MG tablet Take 1-1.5 tablets (5-7.5 mg total) by mouth daily at 6 PM. Take 5 mg Daily By Mouth on , Tues, Thurs, Fri, Sat & Sun, Take 7.5 mg By Mouth on mondays and Wednesdays . (Patient taking differently: Take 5-7.5 mg by mouth daily at 6 PM. Take 5 mg Daily By Mouth on Mon, Tues, Thurs, Fri, Sat & Sun, Take 7.5 mg By Mouth on Wednesdays .) 60 tablet 0   No current facility-administered medications on file prior to visit.    Allergies  Allergen Reactions  . Celebrex [Celecoxib] Rash  . Detrol [Tolterodine] Hives    Family History  Problem Relation Age of Onset  . Stroke    . Heart disease Mother   . Heart disease Sister     BP 116/54 mmHg  Pulse 101  Temp(Src) 99.5 F (37.5 C) (Oral)  Ht 5\' 3"  (1.6 m)  Wt 153 lb (69.4 kg)  BMI 27.11 kg/m2  SpO2 97%  Review of Systems Denies LOC.    Objective:   Physical Exam VITAL SIGNS:  See vs page GENERAL: no distress SKIN:  Insulin injection sites at the anterior abdomen are normal      Assessment & Plan:  DM: Based on the pattern of her cbg's, she needs a faster and shorter-acting QD insulin.  I discussed with , RN  Patient is advised the  following: Patient Instructions  check your blood sugar twice a day.  vary the time of day when you check, between before the 3 meals, and at bedtime.  also check if you have symptoms of your blood sugar being too high or too low.  please keep a record of the readings and bring it to your next appointment here.  You can write it on any piece of paper.  please call Cristy Folks  sooner if your blood sugar goes below 70, or if you have a lot of readings over 200.    On this type of insulin schedule, you should eat meals on a regular schedule.  If a meal is missed or significantly delayed, your blood sugar could go low. Please come back for a follow-up appointment in 3 weeks.     Please call next week, to tell us how the blood sugar is doing. Please see Bonita Quin today, to go over sick-day management.   Please change the levemir to NPH, 25 units each morning.  i have sent a prescription to your pharmacy.   Take novolog, only when your blood sugar is high (2 units for any blood sugar over 250).

## 2015-02-08 NOTE — Patient Instructions (Addendum)
check your blood sugar twice a day.  vary the time of day when you check, between before the 3 meals, and at bedtime.  also check if you have symptoms of your blood sugar being too high or too low.  please keep a record of the readings and bring it to your next appointment here.  You can write it on any piece of paper.  please call us sooner if your blood sugar goes below 70, or if you have a lot of readings over 200.    On this type of insulin schedule, you should eat meals on a regular schedule.  If a meal is missed or significantly delayed, your blood sugar could go low. Please come back for a follow-up appointment in 3 weeks.     Please call next week, to tell us how the blood sugar is doing. Please see Bonita Quin today, to go over sick-day management.   Please change the levemir to NPH, 25 units each morning.  i have sent a prescription to your pharmacy.   Take novolog, only when your blood sugar is high (2 units for any blood sugar over 250).

## 2015-02-08 NOTE — Patient Instructions (Signed)
Read over sick day guidelines and call if questions.   Read over list of supplies needed at the house for sick days. Remember to mix the insulin before injecting. Remember to eat lunch every day, 4-5 hours after her injection

## 2015-02-08 NOTE — Telephone Encounter (Signed)
Please buy at another pharmacy.  It is non-prescription

## 2015-02-08 NOTE — Telephone Encounter (Signed)
Pt was taken off of levemir but the pharmacy will not get the MPH insulin in until tomorrow please advise on what she is to take until the new insulin comes in

## 2015-02-08 NOTE — Telephone Encounter (Signed)
Pt needs to be called due to a discrepancy in medication at pharmacy call back @ [phone]

## 2015-02-08 NOTE — Progress Notes (Signed)
Pt. Stopped taking her insulin when she was sick--vomitting and diarrhea--thinking she did not need her insulin when not eating.  We reviewed the fact that her blood sugars go up when sick, and that she should always take her insulin.   She was given a hand out on sick day guidelines, and we reviewed this along with what she needs to keep at home for sick days.  We reviewed diet for sick days and what she can take in a liquid form for times when she can not keep solid food down.   She reported good understanding of this and had no questions.   She is switching from Levemir to NPH insulin, and we reviewed the timing of this insulin and the need to eat lunch at the same time each day.  Bfast is at 6:30, and she was told to have lunch by 11:30 at the latest.  She agreed to do this.  She was also reminded of the need to mix this insulin thoroughly before injecting.  She reported good understanding of this.    She has 2 people in her house to take care of her.  We reviewed how to use Glucagon fir unresponsive low blood sugar, and she was encouraged to show her house people how to use it, if needed.  She agreed to do this and told me that her medication had expired.  Aundra Millet was told to order another dose for her, and the patient was informed that she can pick that up at the pharmacy with her new insulin.

## 2015-02-09 ENCOUNTER — Ambulatory Visit (HOSPITAL_COMMUNITY): Admission: RE | Admit: 2015-02-09 | Payer: Commercial Managed Care - HMO | Source: Ambulatory Visit

## 2015-02-09 DIAGNOSIS — I10 Essential (primary) hypertension: Secondary | ICD-10-CM | POA: Diagnosis not present

## 2015-02-09 DIAGNOSIS — I69354 Hemiplegia and hemiparesis following cerebral infarction affecting left non-dominant side: Secondary | ICD-10-CM | POA: Diagnosis not present

## 2015-02-09 DIAGNOSIS — E1049 Type 1 diabetes mellitus with other diabetic neurological complication: Secondary | ICD-10-CM | POA: Diagnosis not present

## 2015-02-09 DIAGNOSIS — M069 Rheumatoid arthritis, unspecified: Secondary | ICD-10-CM | POA: Diagnosis not present

## 2015-02-09 DIAGNOSIS — I252 Old myocardial infarction: Secondary | ICD-10-CM | POA: Diagnosis not present

## 2015-02-09 DIAGNOSIS — I2510 Atherosclerotic heart disease of native coronary artery without angina pectoris: Secondary | ICD-10-CM | POA: Diagnosis not present

## 2015-02-09 DIAGNOSIS — F0390 Unspecified dementia without behavioral disturbance: Secondary | ICD-10-CM | POA: Diagnosis not present

## 2015-02-09 DIAGNOSIS — I5043 Acute on chronic combined systolic (congestive) and diastolic (congestive) heart failure: Secondary | ICD-10-CM | POA: Diagnosis not present

## 2015-02-09 DIAGNOSIS — E1065 Type 1 diabetes mellitus with hyperglycemia: Secondary | ICD-10-CM | POA: Diagnosis not present

## 2015-02-09 NOTE — Telephone Encounter (Signed)
Contacted pt and advised of note below. Pt stated she was going to continue the Levemir for today because she will not be able to pick up the NPH until later. She will begin the NPH tomorrow.

## 2015-02-10 ENCOUNTER — Ambulatory Visit (INDEPENDENT_AMBULATORY_CARE_PROVIDER_SITE_OTHER): Payer: Commercial Managed Care - HMO | Admitting: General Practice

## 2015-02-10 DIAGNOSIS — I2510 Atherosclerotic heart disease of native coronary artery without angina pectoris: Secondary | ICD-10-CM | POA: Diagnosis not present

## 2015-02-10 DIAGNOSIS — E1049 Type 1 diabetes mellitus with other diabetic neurological complication: Secondary | ICD-10-CM | POA: Diagnosis not present

## 2015-02-10 DIAGNOSIS — Z954 Presence of other heart-valve replacement: Secondary | ICD-10-CM

## 2015-02-10 DIAGNOSIS — I252 Old myocardial infarction: Secondary | ICD-10-CM | POA: Diagnosis not present

## 2015-02-10 DIAGNOSIS — Z952 Presence of prosthetic heart valve: Secondary | ICD-10-CM

## 2015-02-10 DIAGNOSIS — M069 Rheumatoid arthritis, unspecified: Secondary | ICD-10-CM | POA: Diagnosis not present

## 2015-02-10 DIAGNOSIS — I5043 Acute on chronic combined systolic (congestive) and diastolic (congestive) heart failure: Secondary | ICD-10-CM | POA: Diagnosis not present

## 2015-02-10 DIAGNOSIS — I69354 Hemiplegia and hemiparesis following cerebral infarction affecting left non-dominant side: Secondary | ICD-10-CM | POA: Diagnosis not present

## 2015-02-10 DIAGNOSIS — E1065 Type 1 diabetes mellitus with hyperglycemia: Secondary | ICD-10-CM | POA: Diagnosis not present

## 2015-02-10 DIAGNOSIS — F0390 Unspecified dementia without behavioral disturbance: Secondary | ICD-10-CM | POA: Diagnosis not present

## 2015-02-10 DIAGNOSIS — I10 Essential (primary) hypertension: Secondary | ICD-10-CM | POA: Diagnosis not present

## 2015-02-10 LAB — POCT INR: INR: 2.8

## 2015-02-10 NOTE — Progress Notes (Signed)
Pre visit review using our clinic review tool, if applicable. No additional management support is needed unless otherwise documented below in the visit note. 

## 2015-02-12 DIAGNOSIS — E1049 Type 1 diabetes mellitus with other diabetic neurological complication: Secondary | ICD-10-CM | POA: Diagnosis not present

## 2015-02-12 DIAGNOSIS — I5043 Acute on chronic combined systolic (congestive) and diastolic (congestive) heart failure: Secondary | ICD-10-CM | POA: Diagnosis not present

## 2015-02-12 DIAGNOSIS — E1065 Type 1 diabetes mellitus with hyperglycemia: Secondary | ICD-10-CM | POA: Diagnosis not present

## 2015-02-12 DIAGNOSIS — I252 Old myocardial infarction: Secondary | ICD-10-CM | POA: Diagnosis not present

## 2015-02-12 DIAGNOSIS — I69354 Hemiplegia and hemiparesis following cerebral infarction affecting left non-dominant side: Secondary | ICD-10-CM | POA: Diagnosis not present

## 2015-02-12 DIAGNOSIS — I10 Essential (primary) hypertension: Secondary | ICD-10-CM | POA: Diagnosis not present

## 2015-02-12 DIAGNOSIS — M069 Rheumatoid arthritis, unspecified: Secondary | ICD-10-CM | POA: Diagnosis not present

## 2015-02-12 DIAGNOSIS — I2510 Atherosclerotic heart disease of native coronary artery without angina pectoris: Secondary | ICD-10-CM | POA: Diagnosis not present

## 2015-02-12 DIAGNOSIS — F0390 Unspecified dementia without behavioral disturbance: Secondary | ICD-10-CM | POA: Diagnosis not present

## 2015-02-14 DIAGNOSIS — E1049 Type 1 diabetes mellitus with other diabetic neurological complication: Secondary | ICD-10-CM | POA: Diagnosis not present

## 2015-02-14 DIAGNOSIS — M069 Rheumatoid arthritis, unspecified: Secondary | ICD-10-CM | POA: Diagnosis not present

## 2015-02-14 DIAGNOSIS — I252 Old myocardial infarction: Secondary | ICD-10-CM | POA: Diagnosis not present

## 2015-02-14 DIAGNOSIS — I5043 Acute on chronic combined systolic (congestive) and diastolic (congestive) heart failure: Secondary | ICD-10-CM | POA: Diagnosis not present

## 2015-02-14 DIAGNOSIS — I69354 Hemiplegia and hemiparesis following cerebral infarction affecting left non-dominant side: Secondary | ICD-10-CM | POA: Diagnosis not present

## 2015-02-14 DIAGNOSIS — F0390 Unspecified dementia without behavioral disturbance: Secondary | ICD-10-CM | POA: Diagnosis not present

## 2015-02-14 DIAGNOSIS — E1065 Type 1 diabetes mellitus with hyperglycemia: Secondary | ICD-10-CM | POA: Diagnosis not present

## 2015-02-14 DIAGNOSIS — I10 Essential (primary) hypertension: Secondary | ICD-10-CM | POA: Diagnosis not present

## 2015-02-14 DIAGNOSIS — I2510 Atherosclerotic heart disease of native coronary artery without angina pectoris: Secondary | ICD-10-CM | POA: Diagnosis not present

## 2015-02-15 ENCOUNTER — Other Ambulatory Visit: Payer: Self-pay | Admitting: *Deleted

## 2015-02-16 ENCOUNTER — Other Ambulatory Visit: Payer: Self-pay | Admitting: Family Medicine

## 2015-02-16 ENCOUNTER — Other Ambulatory Visit: Payer: Self-pay | Admitting: Endocrinology

## 2015-02-16 DIAGNOSIS — I2510 Atherosclerotic heart disease of native coronary artery without angina pectoris: Secondary | ICD-10-CM | POA: Diagnosis not present

## 2015-02-16 DIAGNOSIS — E1065 Type 1 diabetes mellitus with hyperglycemia: Secondary | ICD-10-CM | POA: Diagnosis not present

## 2015-02-16 DIAGNOSIS — M069 Rheumatoid arthritis, unspecified: Secondary | ICD-10-CM | POA: Diagnosis not present

## 2015-02-16 DIAGNOSIS — F0390 Unspecified dementia without behavioral disturbance: Secondary | ICD-10-CM | POA: Diagnosis not present

## 2015-02-16 DIAGNOSIS — I5043 Acute on chronic combined systolic (congestive) and diastolic (congestive) heart failure: Secondary | ICD-10-CM | POA: Diagnosis not present

## 2015-02-16 DIAGNOSIS — E1049 Type 1 diabetes mellitus with other diabetic neurological complication: Secondary | ICD-10-CM | POA: Diagnosis not present

## 2015-02-16 DIAGNOSIS — I252 Old myocardial infarction: Secondary | ICD-10-CM | POA: Diagnosis not present

## 2015-02-16 DIAGNOSIS — I10 Essential (primary) hypertension: Secondary | ICD-10-CM | POA: Diagnosis not present

## 2015-02-16 DIAGNOSIS — I69354 Hemiplegia and hemiparesis following cerebral infarction affecting left non-dominant side: Secondary | ICD-10-CM | POA: Diagnosis not present

## 2015-02-17 ENCOUNTER — Ambulatory Visit (INDEPENDENT_AMBULATORY_CARE_PROVIDER_SITE_OTHER): Payer: Commercial Managed Care - HMO | Admitting: General Practice

## 2015-02-17 DIAGNOSIS — I2510 Atherosclerotic heart disease of native coronary artery without angina pectoris: Secondary | ICD-10-CM | POA: Diagnosis not present

## 2015-02-17 DIAGNOSIS — Z954 Presence of other heart-valve replacement: Secondary | ICD-10-CM

## 2015-02-17 DIAGNOSIS — I69354 Hemiplegia and hemiparesis following cerebral infarction affecting left non-dominant side: Secondary | ICD-10-CM | POA: Diagnosis not present

## 2015-02-17 DIAGNOSIS — F0390 Unspecified dementia without behavioral disturbance: Secondary | ICD-10-CM | POA: Diagnosis not present

## 2015-02-17 DIAGNOSIS — I5043 Acute on chronic combined systolic (congestive) and diastolic (congestive) heart failure: Secondary | ICD-10-CM | POA: Diagnosis not present

## 2015-02-17 DIAGNOSIS — E1049 Type 1 diabetes mellitus with other diabetic neurological complication: Secondary | ICD-10-CM | POA: Diagnosis not present

## 2015-02-17 DIAGNOSIS — Z952 Presence of prosthetic heart valve: Secondary | ICD-10-CM

## 2015-02-17 DIAGNOSIS — I10 Essential (primary) hypertension: Secondary | ICD-10-CM | POA: Diagnosis not present

## 2015-02-17 DIAGNOSIS — I252 Old myocardial infarction: Secondary | ICD-10-CM | POA: Diagnosis not present

## 2015-02-17 DIAGNOSIS — E1065 Type 1 diabetes mellitus with hyperglycemia: Secondary | ICD-10-CM | POA: Diagnosis not present

## 2015-02-17 DIAGNOSIS — M069 Rheumatoid arthritis, unspecified: Secondary | ICD-10-CM | POA: Diagnosis not present

## 2015-02-17 LAB — POCT INR: INR: 4

## 2015-02-17 NOTE — Telephone Encounter (Signed)
error 

## 2015-02-17 NOTE — Progress Notes (Signed)
Pre visit review using our clinic review tool, if applicable. No additional management support is needed unless otherwise documented below in the visit note. 

## 2015-02-19 DIAGNOSIS — E1065 Type 1 diabetes mellitus with hyperglycemia: Secondary | ICD-10-CM | POA: Diagnosis not present

## 2015-02-19 DIAGNOSIS — I5043 Acute on chronic combined systolic (congestive) and diastolic (congestive) heart failure: Secondary | ICD-10-CM | POA: Diagnosis not present

## 2015-02-19 DIAGNOSIS — I2510 Atherosclerotic heart disease of native coronary artery without angina pectoris: Secondary | ICD-10-CM | POA: Diagnosis not present

## 2015-02-19 DIAGNOSIS — F0390 Unspecified dementia without behavioral disturbance: Secondary | ICD-10-CM | POA: Diagnosis not present

## 2015-02-19 DIAGNOSIS — I69354 Hemiplegia and hemiparesis following cerebral infarction affecting left non-dominant side: Secondary | ICD-10-CM | POA: Diagnosis not present

## 2015-02-19 DIAGNOSIS — I10 Essential (primary) hypertension: Secondary | ICD-10-CM | POA: Diagnosis not present

## 2015-02-19 DIAGNOSIS — I252 Old myocardial infarction: Secondary | ICD-10-CM | POA: Diagnosis not present

## 2015-02-19 DIAGNOSIS — M069 Rheumatoid arthritis, unspecified: Secondary | ICD-10-CM | POA: Diagnosis not present

## 2015-02-19 DIAGNOSIS — E1049 Type 1 diabetes mellitus with other diabetic neurological complication: Secondary | ICD-10-CM | POA: Diagnosis not present

## 2015-02-21 DIAGNOSIS — I5043 Acute on chronic combined systolic (congestive) and diastolic (congestive) heart failure: Secondary | ICD-10-CM | POA: Diagnosis not present

## 2015-02-21 DIAGNOSIS — I2510 Atherosclerotic heart disease of native coronary artery without angina pectoris: Secondary | ICD-10-CM | POA: Diagnosis not present

## 2015-02-21 DIAGNOSIS — E1065 Type 1 diabetes mellitus with hyperglycemia: Secondary | ICD-10-CM | POA: Diagnosis not present

## 2015-02-21 DIAGNOSIS — I252 Old myocardial infarction: Secondary | ICD-10-CM | POA: Diagnosis not present

## 2015-02-21 DIAGNOSIS — M069 Rheumatoid arthritis, unspecified: Secondary | ICD-10-CM | POA: Diagnosis not present

## 2015-02-21 DIAGNOSIS — F0390 Unspecified dementia without behavioral disturbance: Secondary | ICD-10-CM | POA: Diagnosis not present

## 2015-02-21 DIAGNOSIS — I10 Essential (primary) hypertension: Secondary | ICD-10-CM | POA: Diagnosis not present

## 2015-02-21 DIAGNOSIS — I69354 Hemiplegia and hemiparesis following cerebral infarction affecting left non-dominant side: Secondary | ICD-10-CM | POA: Diagnosis not present

## 2015-02-21 DIAGNOSIS — E1049 Type 1 diabetes mellitus with other diabetic neurological complication: Secondary | ICD-10-CM | POA: Diagnosis not present

## 2015-02-22 ENCOUNTER — Other Ambulatory Visit: Payer: Self-pay

## 2015-02-22 NOTE — Patient Instructions (Addendum)
Continue Community Case Management by reviewing community resources with patient and cousin  Patient verbalizes stablization in blood sugars with medication and diet compliance.  Patient is to continue all medications as prescribed Patient is to continue to monitor blood sugars as current (5 times per day) Patient is to continue to attend medical appointment attendance. Patient is to continue contact with this RNCM as agreed up

## 2015-02-22 NOTE — Patient Outreach (Signed)
Patient has been compliant with medical appointments, states she is looking forward to starting with Cardio-Pulmonary Rehabilitation.    This RNCM and patient agreed to make contact via telephone next week.

## 2015-02-23 DIAGNOSIS — I5043 Acute on chronic combined systolic (congestive) and diastolic (congestive) heart failure: Secondary | ICD-10-CM | POA: Diagnosis not present

## 2015-02-23 DIAGNOSIS — E1049 Type 1 diabetes mellitus with other diabetic neurological complication: Secondary | ICD-10-CM | POA: Diagnosis not present

## 2015-02-23 DIAGNOSIS — I10 Essential (primary) hypertension: Secondary | ICD-10-CM | POA: Diagnosis not present

## 2015-02-23 DIAGNOSIS — F0390 Unspecified dementia without behavioral disturbance: Secondary | ICD-10-CM | POA: Diagnosis not present

## 2015-02-23 DIAGNOSIS — E1065 Type 1 diabetes mellitus with hyperglycemia: Secondary | ICD-10-CM | POA: Diagnosis not present

## 2015-02-23 DIAGNOSIS — M069 Rheumatoid arthritis, unspecified: Secondary | ICD-10-CM | POA: Diagnosis not present

## 2015-02-23 DIAGNOSIS — I252 Old myocardial infarction: Secondary | ICD-10-CM | POA: Diagnosis not present

## 2015-02-23 DIAGNOSIS — I69354 Hemiplegia and hemiparesis following cerebral infarction affecting left non-dominant side: Secondary | ICD-10-CM | POA: Diagnosis not present

## 2015-02-23 DIAGNOSIS — I2510 Atherosclerotic heart disease of native coronary artery without angina pectoris: Secondary | ICD-10-CM | POA: Diagnosis not present

## 2015-02-24 ENCOUNTER — Ambulatory Visit (INDEPENDENT_AMBULATORY_CARE_PROVIDER_SITE_OTHER): Payer: Commercial Managed Care - HMO | Admitting: General Practice

## 2015-02-24 DIAGNOSIS — I10 Essential (primary) hypertension: Secondary | ICD-10-CM | POA: Diagnosis not present

## 2015-02-24 DIAGNOSIS — I5043 Acute on chronic combined systolic (congestive) and diastolic (congestive) heart failure: Secondary | ICD-10-CM | POA: Diagnosis not present

## 2015-02-24 DIAGNOSIS — M069 Rheumatoid arthritis, unspecified: Secondary | ICD-10-CM | POA: Diagnosis not present

## 2015-02-24 DIAGNOSIS — E1065 Type 1 diabetes mellitus with hyperglycemia: Secondary | ICD-10-CM | POA: Diagnosis not present

## 2015-02-24 DIAGNOSIS — Z952 Presence of prosthetic heart valve: Secondary | ICD-10-CM

## 2015-02-24 DIAGNOSIS — I252 Old myocardial infarction: Secondary | ICD-10-CM | POA: Diagnosis not present

## 2015-02-24 DIAGNOSIS — I2510 Atherosclerotic heart disease of native coronary artery without angina pectoris: Secondary | ICD-10-CM | POA: Diagnosis not present

## 2015-02-24 DIAGNOSIS — Z954 Presence of other heart-valve replacement: Secondary | ICD-10-CM

## 2015-02-24 DIAGNOSIS — F0390 Unspecified dementia without behavioral disturbance: Secondary | ICD-10-CM | POA: Diagnosis not present

## 2015-02-24 DIAGNOSIS — E1049 Type 1 diabetes mellitus with other diabetic neurological complication: Secondary | ICD-10-CM | POA: Diagnosis not present

## 2015-02-24 DIAGNOSIS — I69354 Hemiplegia and hemiparesis following cerebral infarction affecting left non-dominant side: Secondary | ICD-10-CM | POA: Diagnosis not present

## 2015-02-24 LAB — POCT INR: INR: 1.2

## 2015-02-24 NOTE — Progress Notes (Signed)
Pre visit review using our clinic review tool, if applicable. No additional management support is needed unless otherwise documented below in the visit note. 

## 2015-02-25 DIAGNOSIS — M069 Rheumatoid arthritis, unspecified: Secondary | ICD-10-CM | POA: Diagnosis not present

## 2015-02-25 DIAGNOSIS — I10 Essential (primary) hypertension: Secondary | ICD-10-CM | POA: Diagnosis not present

## 2015-02-25 DIAGNOSIS — I5043 Acute on chronic combined systolic (congestive) and diastolic (congestive) heart failure: Secondary | ICD-10-CM | POA: Diagnosis not present

## 2015-02-25 DIAGNOSIS — I252 Old myocardial infarction: Secondary | ICD-10-CM | POA: Diagnosis not present

## 2015-02-25 DIAGNOSIS — E1065 Type 1 diabetes mellitus with hyperglycemia: Secondary | ICD-10-CM | POA: Diagnosis not present

## 2015-02-25 DIAGNOSIS — E1049 Type 1 diabetes mellitus with other diabetic neurological complication: Secondary | ICD-10-CM | POA: Diagnosis not present

## 2015-02-25 DIAGNOSIS — I69354 Hemiplegia and hemiparesis following cerebral infarction affecting left non-dominant side: Secondary | ICD-10-CM | POA: Diagnosis not present

## 2015-02-25 DIAGNOSIS — I2510 Atherosclerotic heart disease of native coronary artery without angina pectoris: Secondary | ICD-10-CM | POA: Diagnosis not present

## 2015-02-25 DIAGNOSIS — F0390 Unspecified dementia without behavioral disturbance: Secondary | ICD-10-CM | POA: Diagnosis not present

## 2015-02-26 DIAGNOSIS — E1065 Type 1 diabetes mellitus with hyperglycemia: Secondary | ICD-10-CM | POA: Diagnosis not present

## 2015-02-26 DIAGNOSIS — I10 Essential (primary) hypertension: Secondary | ICD-10-CM | POA: Diagnosis not present

## 2015-02-26 DIAGNOSIS — E1049 Type 1 diabetes mellitus with other diabetic neurological complication: Secondary | ICD-10-CM | POA: Diagnosis not present

## 2015-02-26 DIAGNOSIS — I2510 Atherosclerotic heart disease of native coronary artery without angina pectoris: Secondary | ICD-10-CM | POA: Diagnosis not present

## 2015-02-26 DIAGNOSIS — I252 Old myocardial infarction: Secondary | ICD-10-CM | POA: Diagnosis not present

## 2015-02-26 DIAGNOSIS — F0390 Unspecified dementia without behavioral disturbance: Secondary | ICD-10-CM | POA: Diagnosis not present

## 2015-02-26 DIAGNOSIS — I69354 Hemiplegia and hemiparesis following cerebral infarction affecting left non-dominant side: Secondary | ICD-10-CM | POA: Diagnosis not present

## 2015-02-26 DIAGNOSIS — I5043 Acute on chronic combined systolic (congestive) and diastolic (congestive) heart failure: Secondary | ICD-10-CM | POA: Diagnosis not present

## 2015-02-26 DIAGNOSIS — M069 Rheumatoid arthritis, unspecified: Secondary | ICD-10-CM | POA: Diagnosis not present

## 2015-02-27 ENCOUNTER — Other Ambulatory Visit: Payer: Self-pay | Admitting: Family

## 2015-02-28 DIAGNOSIS — E1065 Type 1 diabetes mellitus with hyperglycemia: Secondary | ICD-10-CM | POA: Diagnosis not present

## 2015-02-28 DIAGNOSIS — I5043 Acute on chronic combined systolic (congestive) and diastolic (congestive) heart failure: Secondary | ICD-10-CM | POA: Diagnosis not present

## 2015-02-28 DIAGNOSIS — F0390 Unspecified dementia without behavioral disturbance: Secondary | ICD-10-CM | POA: Diagnosis not present

## 2015-02-28 DIAGNOSIS — I10 Essential (primary) hypertension: Secondary | ICD-10-CM | POA: Diagnosis not present

## 2015-02-28 DIAGNOSIS — I2510 Atherosclerotic heart disease of native coronary artery without angina pectoris: Secondary | ICD-10-CM | POA: Diagnosis not present

## 2015-02-28 DIAGNOSIS — E1049 Type 1 diabetes mellitus with other diabetic neurological complication: Secondary | ICD-10-CM | POA: Diagnosis not present

## 2015-02-28 DIAGNOSIS — I69354 Hemiplegia and hemiparesis following cerebral infarction affecting left non-dominant side: Secondary | ICD-10-CM | POA: Diagnosis not present

## 2015-02-28 DIAGNOSIS — I252 Old myocardial infarction: Secondary | ICD-10-CM | POA: Diagnosis not present

## 2015-02-28 DIAGNOSIS — M069 Rheumatoid arthritis, unspecified: Secondary | ICD-10-CM | POA: Diagnosis not present

## 2015-03-01 ENCOUNTER — Telehealth: Payer: Self-pay | Admitting: Family

## 2015-03-01 ENCOUNTER — Ambulatory Visit: Payer: Commercial Managed Care - HMO | Admitting: Endocrinology

## 2015-03-01 DIAGNOSIS — M069 Rheumatoid arthritis, unspecified: Secondary | ICD-10-CM

## 2015-03-01 MED ORDER — METHOTREXATE 2.5 MG PO TABS: 2.5000 mg | ORAL_TABLET | ORAL | Status: DC

## 2015-03-01 NOTE — Telephone Encounter (Signed)
Verbal order given  

## 2015-03-01 NOTE — Telephone Encounter (Signed)
Pt needs OV 

## 2015-03-01 NOTE — Telephone Encounter (Signed)
Rx sent and referral for Rheumatology placed

## 2015-03-01 NOTE — Telephone Encounter (Signed)
Instructions in chart say pt takes twice a week not twice a day

## 2015-03-01 NOTE — Telephone Encounter (Signed)
[name] called back stating patient needs to be D/C from [facility], she has SCAT and no longer homebound.  Would like referral to go to [facility] PT at [facility] (f) [phone].

## 2015-03-01 NOTE — Telephone Encounter (Signed)
Pt needs refill on methotrexate #180 sent to [facility]

## 2015-03-01 NOTE — Telephone Encounter (Signed)
Madison [name] is calling because outpt cardiac rehab appointment  has been cancelled. Pt must see her cardiologist first. Pt has an appt with cardiologist  in may 2016. Madison [name] is needing verbal order to continue physical therapy for this pt

## 2015-03-02 ENCOUNTER — Telehealth (HOSPITAL_COMMUNITY): Payer: Self-pay | Admitting: Cardiac Rehabilitation

## 2015-03-02 DIAGNOSIS — I69354 Hemiplegia and hemiparesis following cerebral infarction affecting left non-dominant side: Secondary | ICD-10-CM | POA: Diagnosis not present

## 2015-03-02 DIAGNOSIS — I5043 Acute on chronic combined systolic (congestive) and diastolic (congestive) heart failure: Secondary | ICD-10-CM | POA: Diagnosis not present

## 2015-03-02 DIAGNOSIS — F0390 Unspecified dementia without behavioral disturbance: Secondary | ICD-10-CM | POA: Diagnosis not present

## 2015-03-02 DIAGNOSIS — E1049 Type 1 diabetes mellitus with other diabetic neurological complication: Secondary | ICD-10-CM | POA: Diagnosis not present

## 2015-03-02 DIAGNOSIS — I2510 Atherosclerotic heart disease of native coronary artery without angina pectoris: Secondary | ICD-10-CM | POA: Diagnosis not present

## 2015-03-02 DIAGNOSIS — E1065 Type 1 diabetes mellitus with hyperglycemia: Secondary | ICD-10-CM | POA: Diagnosis not present

## 2015-03-02 DIAGNOSIS — I10 Essential (primary) hypertension: Secondary | ICD-10-CM | POA: Diagnosis not present

## 2015-03-02 DIAGNOSIS — I252 Old myocardial infarction: Secondary | ICD-10-CM | POA: Diagnosis not present

## 2015-03-02 DIAGNOSIS — M069 Rheumatoid arthritis, unspecified: Secondary | ICD-10-CM | POA: Diagnosis not present

## 2015-03-02 LAB — POCT INR: INR: 1.1

## 2015-03-02 NOTE — Telephone Encounter (Signed)
Talked to patient's cousin [name] and patient is scheduled to see [name] on 02/05/15.

## 2015-03-02 NOTE — Telephone Encounter (Signed)
pc to pt to advise of need to r/s cardiac rehab orientation. Pt will need to have cardiology hospital follow up appt prior to starting exercise at cardiac rehab.  Pt not available.  Left message with pt cousin (emergency contact) requesting pt to schedule this appointment.  Offered to make phone to schedule for pt, however pt cousin declined stating pt would be able to call to make the appointment. Informed of importance of cardiology hospital follow up.

## 2015-03-03 ENCOUNTER — Ambulatory Visit (HOSPITAL_COMMUNITY): Payer: Commercial Managed Care - HMO

## 2015-03-03 ENCOUNTER — Ambulatory Visit (INDEPENDENT_AMBULATORY_CARE_PROVIDER_SITE_OTHER): Payer: Commercial Managed Care - HMO | Admitting: General Practice

## 2015-03-03 ENCOUNTER — Telehealth: Payer: Self-pay | Admitting: Endocrinology

## 2015-03-03 DIAGNOSIS — Z952 Presence of prosthetic heart valve: Secondary | ICD-10-CM

## 2015-03-03 DIAGNOSIS — Z954 Presence of other heart-valve replacement: Secondary | ICD-10-CM

## 2015-03-03 NOTE — Telephone Encounter (Signed)
Pt has questions about novolog

## 2015-03-03 NOTE — Progress Notes (Signed)
Pre visit review using our clinic review tool, if applicable. No additional management support is needed unless otherwise documented below in the visit note. 

## 2015-03-03 NOTE — Telephone Encounter (Signed)
Request call back to discuss.

## 2015-03-04 ENCOUNTER — Other Ambulatory Visit: Payer: Self-pay | Admitting: Internal Medicine

## 2015-03-04 ENCOUNTER — Telehealth: Payer: Self-pay | Admitting: Endocrinology

## 2015-03-04 NOTE — Telephone Encounter (Signed)
Patient no showed today's appt. Please advise on how to follow up. °A. No follow up necessary. °B. Follow up urgent. Contact patient immediately. °C. Follow up necessary. Contact patient and schedule visit in ___ days. °D. Follow up advised. Contact patient and schedule visit in ____weeks. ° °

## 2015-03-05 NOTE — Telephone Encounter (Signed)
Follow up advised. Contact patient and schedule visit in 2 weeks. 

## 2015-03-07 ENCOUNTER — Other Ambulatory Visit: Payer: Self-pay

## 2015-03-07 ENCOUNTER — Ambulatory Visit (HOSPITAL_COMMUNITY): Payer: Commercial Managed Care - HMO

## 2015-03-07 MED ORDER — INSULIN ASPART 100 UNIT/ML FLEXPEN: PEN_INJECTOR | SUBCUTANEOUS | Status: DC

## 2015-03-07 NOTE — Telephone Encounter (Signed)
Unable to reach pt. Letter mailed

## 2015-03-07 NOTE — Patient Outreach (Signed)
[facility]) Care Management  03/07/2015  TRYSTAN EADS [birth year] [id number]   Call answered by patient's cousin, [name], who states patient is currently resting.   Contact attempted to schedule home visit this week.    Will try again to make contact with patient via telephone tomorrow, March 08, 2015.

## 2015-03-08 ENCOUNTER — Ambulatory Visit (INDEPENDENT_AMBULATORY_CARE_PROVIDER_SITE_OTHER): Payer: Commercial Managed Care - HMO | Admitting: Adult Health

## 2015-03-08 ENCOUNTER — Encounter: Payer: Self-pay | Admitting: Adult Health

## 2015-03-08 VITALS — BP 122/80 | Temp 98.2°F | Ht 63.0 in | Wt 157.2 lb

## 2015-03-08 DIAGNOSIS — Z7689 Persons encountering health services in other specified circumstances: Secondary | ICD-10-CM

## 2015-03-08 DIAGNOSIS — G8110 Spastic hemiplegia affecting unspecified side: Secondary | ICD-10-CM

## 2015-03-08 DIAGNOSIS — Z23 Encounter for immunization: Secondary | ICD-10-CM | POA: Diagnosis not present

## 2015-03-08 DIAGNOSIS — Z7189 Other specified counseling: Secondary | ICD-10-CM | POA: Diagnosis not present

## 2015-03-08 MED ORDER — SOLIFENACIN SUCCINATE 5 MG PO TABS: 2.5000 mg | ORAL_TABLET | Freq: Every day | ORAL | Status: DC

## 2015-03-08 NOTE — Addendum Note (Signed)
Addended by: Nancy Fetter on: 03/08/2015 01:13 PM   Modules accepted: Orders

## 2015-03-08 NOTE — Progress Notes (Signed)
HPI:  Madison [name] is here to establish care.  Last PCP and physical:Last  Year with [facility] exam: In the process of scheduling Dental: Unknown GYN: Unknown  Has the following chronic problems that require follow up and concerns today:  She does not endorse any problems with her health at this time.   Diabetes - Her blood sugars have been well controlled now, has not been in the hospital recently. Blood sugars have been 120-300 with that being the highest a few days ago. Followed by Endocrine. She is losing weight with diet and exercise.  Wt Readings from Last 3 Encounters:  03/08/15 157 lb 3.2 oz (71.305 kg)  02/08/15 153 lb (69.4 kg)  01/31/15 155 lb 12.8 oz (70.67 kg)    HTN:  Well controlled under current regimen.  She needs a referral to PT/OT as well as a prescription for Vesicare.   ROS negative for unless reported above: fevers, unintentional weight loss, hearing or vision loss, chest pain, palpitations, struggling to breath, hemoptysis, melena, hematochezia, hematuria, falls, loc, si, thoughts of self harm  Past Medical History  Diagnosis Date  . CAD (coronary artery disease)   . Obesity   . Hypercholesteremia   . HTN (hypertension)   . Dyslipidemia   . Aortic stenosis   . Thrombophlebitis   . Heart murmur   . Myocardial infarction 12/2014  . Pneumonia 11/2014; 12/2014  . Diabetes dx'd 1982    "went straight to insulin"  . GERD (gastroesophageal reflux disease)   . 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 Rheumatoid arthritis(714.0)     "hands" (01/19/2015)  . Anxiety   . Depression   . Allergy   . CHF (congestive heart failure)     Past Surgical History  Procedure Laterality Date  . Cesarean section  1995  . Foot fracture surgery    . Knee arthroscopy Right   . Neuroplasty / transposition median nerve at carpal tunnel    . Left heart catheterization with coronary angiogram N/A  12/08/2014    Procedure: LEFT HEART CATHETERIZATION WITH CORONARY ANGIOGRAM;  Surgeon: Wellington Hampshire, MD;  Location: [facility] CATH [facility];  Service: Cardiovascular;  Laterality: N/A;  . Percutaneous coronary stent intervention (pci-s) N/A 12/09/2014    Procedure: PERCUTANEOUS CORONARY STENT INTERVENTION (PCI-S);  Surgeon: Leonie Man, MD;  Location: [facility] CATH LAB;  Service: Cardiovascular;  Laterality: N/A;  . Cardiac catheterization  12/08/2014  . Coronary angioplasty with stent placement  12/09/2014  . Aortic valve replacement (avr)/coronary artery bypass grafting (cabg)  "2014"    Archie Endo 03/08/2014  . Fracture surgery    . Tubal ligation  1995  . Cataract extraction Left     Family History  Problem Relation Age of Onset  . Stroke    . Heart disease Mother   . Heart disease Sister     History   Social History  . Marital Status: Divorced    Spouse Name: N/A  . Number of Children: N/A  . Years of Education: N/A   Social History Main Topics  . Smoking status: Never Smoker   . Smokeless tobacco: Never Used  . Alcohol Use: No  . Drug Use: No  . Sexual Activity: Not on file   Other Topics Concern  . None   Social History Narrative   Divroced and lives at home.    Has two sons who live in  [place] at this time. (21&8 y/o).    Has a cat    Walks a lot.       She is having great success with her diet       Working with exercises that were given to her by PT/OT.      Current outpatient prescriptions:  .  ACCU-CHEK SOFTCLIX LANCETS lancets, , Disp: , Rfl:  .  aspirin 81 MG tablet, Take 1 tablet (81 mg total) by mouth daily., Disp: 30 tablet, Rfl:  .  baclofen (LIORESAL) 10 MG tablet, Take 1 tablet (10 mg total) by mouth 2 (two) times daily., Disp: 60 each, Rfl: 1 .  clopidogrel (PLAVIX) 75 MG tablet, Take 1 tablet (75 mg total) by mouth daily with breakfast., Disp: 30 tablet, Rfl: 2 .  escitalopram (LEXAPRO) 20 MG tablet, Take 20 mg by mouth daily., Disp: , Rfl:  .  feeding  supplement, GLUCERNA SHAKE, (GLUCERNA SHAKE) LIQD, Take 237 mLs by mouth 2 (two) times daily between meals., Disp: 60 Can, Rfl: 0 .  glucagon (GLUCAGON EMERGENCY) 1 MG injection, Inject 1 mg into the vein once as needed., Disp: 1 each, Rfl: 5 .  glucose blood (ACCU-CHEK AVIVA) test strip, Use to check blood sugar 5 times per day Dx Code E10.10 And lancets 5/day, Disp: 150 each, Rfl: 11 .  insulin aspart (NOVOLOG FLEXPEN) 100 UNIT/ML FlexPen, Inject 2 units for any blood sugar over 250, Disp: 15 mL, Rfl: 3 .  Insulin NPH, Human,, Isophane, (HUMULIN N) 100 UNIT/ML Kiwkpen, Inject 25 Units into the skin every morning. And pen needles, 1/day, Disp: 15 mL, Rfl: 11 .  lisinopril (PRINIVIL,ZESTRIL) 2.5 MG tablet, Take 2.5 mg by mouth daily., Disp: , Rfl: 0 .  methotrexate (RHEUMATREX) 2.5 MG tablet, Take 1 tablet (2.5 mg total) by mouth 2 (two) times a week. Caution:Chemotherapy. Protect from light., Disp: 24 tablet, Rfl: 0 .  metoprolol tartrate (LOPRESSOR) 25 MG tablet, Take 0.5 tablets (12.5 mg total) by mouth 2 (two) times daily. (Patient taking differently: Take 25 mg by mouth daily. ), Disp: 60 tablet, Rfl: 0 .  omeprazole (PRILOSEC) 40 MG capsule, TAKE 1 CAPSULE (40 MG TOTAL) BY MOUTH DAILY., Disp: 30 capsule, Rfl: 6 .  pravastatin (PRAVACHOL) 20 MG tablet, TAKE 1 TABLET BY MOUTH EVERY DAY, Disp: 90 tablet, Rfl: 0 .  promethazine (PHENERGAN) 25 MG tablet, Take 1 tablet (25 mg total) by mouth every 4 (four) hours as needed for nausea or vomiting., Disp: 60 tablet, Rfl: 0 .  traMADol (ULTRAM) 50 MG tablet, TAKE 1-2 TABLETS AT BEDTIME AS NEEDED FOR SLEEP, Disp: 90 tablet, Rfl: 0 .  warfarin (COUMADIN) 5 MG tablet, Take 1-1.5 tablets (5-7.5 mg total) by mouth daily at 6 PM. Take 5 mg Daily By Mouth on , Tues, Thurs, Fri, Sat & Sun, Take 7.5 mg By Mouth on mondays and Wednesdays . (Patient taking differently: Take 5-7.5 mg by mouth daily at 6 PM. Take 5 mg Daily By Mouth on Mon, Tues, Thurs, Fri, Sat & Sun,  Take 7.5 mg By Mouth on Wednesdays .), Disp: 60 tablet, Rfl: 0 .  solifenacin (VESICARE) 5 MG tablet, Take 0.5 tablets (2.5 mg total) by mouth daily., Disp: 45 tablet, Rfl: 3  EXAM:  Filed Vitals:   03/08/15 1057  BP: 122/80  Temp: 98.2 F (36.8 C)    Body mass index is 27.85 kg/(m^2).  GENERAL: vitals reviewed and listed above, alert, oriented, appears well hydrated and in no acute distress  HEENT: atraumatic, conjunttiva clear, no obvious abnormalities on inspection of external nose and ears  NECK: no obvious masses on inspection  LUNGS: clear to auscultation bilaterally, no wheezes, rales or rhonchi, good air movement  CV: HRRR, no peripheral edema  MS: moves all extremities without noticeable abnormality  PSYCH: pleasant and cooperative, no obvious depression or anxiety  ASSESSMENT AND PLAN:  Discussed the following assessment and plan:  Spastic hemiplegia affecting nondominant side - Plan: Ambulatory referral to Physical Therapy  Need for pneumococcal vaccination - Plan: Pneumococcal conjugate vaccine 13-valent IM  Encounter to establish care  - Refilled Vesicare - Follow up for complete physical in 3 months  - Follow up sooner with any chronic problems.   -We reviewed the PMH, PSH, FH, SH, Meds and Allergies. -We provided refills for any medications we will prescribe as needed. -We addressed current concerns per orders and patient instructions. -We have advised patient to follow up per instructions below.   -Patient advised to return or notify a provider immediately if symptoms worsen or persist or new concerns arise.  Patient Instructions  It was a pleasure meeting you today. I have refilled your prescription for Vesicare and have called in a referral to physical therapy. I would like you to follow up with me in 2-3 months for a complete physical. If you need anything before then, please let me know.   Health Maintenance Adopting a healthy lifestyle and  getting preventive care can go a long way to promote health and wellness. Talk with your health care provider about what schedule of regular examinations is right for you. This is a good chance for you to check in with your provider about disease prevention and staying healthy. In between checkups, there are plenty of things you can do on your own. Experts have done a lot of research about which lifestyle changes and preventive measures are most likely to keep you healthy. Ask your health care provider for more information. WEIGHT AND DIET  Eat a healthy diet  Be sure to include plenty of vegetables, fruits, low-fat dairy products, and lean protein.  Do not eat a lot of foods high in solid fats, added sugars, or salt.  Get regular exercise. This is one of the most important things you can do for your health.  Most adults should exercise for at least 150 minutes each week. The exercise should increase your heart rate and make you sweat (moderate-intensity exercise).  Most adults should also do strengthening exercises at least twice a week. This is in addition to the moderate-intensity exercise.  Maintain a healthy weight  Body mass index (BMI) is a measurement that can be used to identify possible weight problems. It estimates body fat based on height and weight. Your health care provider can help determine your BMI and help you achieve or maintain a healthy weight.  For females 78 years of age and older:   A BMI below 18.5 is considered underweight.  A BMI of 18.5 to 24.9 is normal.  A BMI of 25 to 29.9 is considered overweight.  A BMI of 30 and above is considered obese.  Watch levels of cholesterol and blood lipids  You should start having your blood tested for lipids and cholesterol at 54 years of age, then have this test every 5 years.  You may need to have your cholesterol levels checked more often if:  Your lipid or cholesterol levels are high.  You are older than 54 years  of age.  You are at high risk for heart disease.  CANCER SCREENING   Lung Cancer  Lung cancer screening is recommended for adults 108-21 years old who are at high risk for lung cancer because of a history of smoking.  A yearly low-dose CT scan of the lungs is recommended for people who:  Currently smoke.  Have quit within the past 15 years.  Have at least a 30-pack-year history of smoking. A pack year is smoking an average of one pack of cigarettes a day for 1 year.  Yearly screening should continue until it has been 15 years since you quit.  Yearly screening should stop if you develop a health problem that would prevent you from having lung cancer treatment.  Breast Cancer  Practice breast self-awareness. This means understanding how your breasts normally appear and feel.  It also means doing regular breast self-exams. Let your health care provider know about any changes, no matter how small.  If you are in your 20s or 30s, you should have a clinical breast exam (CBE) by a health care provider every 1-3 years as part of a regular health exam.  If you are 73 or older, have a CBE every year. Also consider having a breast X-ray (mammogram) every year.  If you have a family history of breast cancer, talk to your health care provider about genetic screening.  If you are at high risk for breast cancer, talk to your health care provider about having an MRI and a mammogram every year.  Breast cancer gene (BRCA) assessment is recommended for women who have family members with BRCA-related cancers. BRCA-related cancers include:  Breast.  Ovarian.  Tubal.  Peritoneal cancers.  Results of the assessment will determine the need for genetic counseling and BRCA1 and BRCA2 testing. Cervical Cancer Routine pelvic examinations to screen for cervical cancer are no longer recommended for nonpregnant women who are considered low risk for cancer of the pelvic organs (ovaries, uterus, and  vagina) and who do not have symptoms. A pelvic examination may be necessary if you have symptoms including those associated with pelvic infections. Ask your health care provider if a screening pelvic exam is right for you.   The Pap test is the screening test for cervical cancer for women who are considered at risk.  If you had a hysterectomy for a problem that was not cancer or a condition that could lead to cancer, then you no longer need Pap tests.  If you are older than 65 years, and you have had normal Pap tests for the past 10 years, you no longer need to have Pap tests.  If you have had past treatment for cervical cancer or a condition that could lead to cancer, you need Pap tests and screening for cancer for at least 20 years after your treatment.  If you no longer get a Pap test, assess your risk factors if they change (such as having a new sexual partner). This can affect whether you should start being screened again.  Some women have medical problems that increase their chance of getting cervical cancer. If this is the case for you, your health care provider may recommend more frequent screening and Pap tests.  The human papillomavirus (HPV) test is another test that may be used for cervical cancer screening. The HPV test looks for the virus that can cause cell changes in the cervix. The cells collected during the Pap test can be tested for HPV.  The HPV test can be  used to screen women 110 years of age and older. Getting tested for HPV can extend the interval between normal Pap tests from three to five years.  An HPV test also should be used to screen women of any age who have unclear Pap test results.  After 54 years of age, women should have HPV testing as often as Pap tests.  Colorectal Cancer  This type of cancer can be detected and often prevented.  Routine colorectal cancer screening usually begins at 54 years of age and continues through 54 years of age.  Your health  care provider may recommend screening at an earlier age if you have risk factors for colon cancer.  Your health care provider may also recommend using home test kits to check for hidden blood in the stool.  A small camera at the end of a tube can be used to examine your colon directly (sigmoidoscopy or colonoscopy). This is done to check for the earliest forms of colorectal cancer.  Routine screening usually begins at age 77.  Direct examination of the colon should be repeated every 5-10 years through 54 years of age. However, you may need to be screened more often if early forms of precancerous polyps or small growths are found. Skin Cancer  Check your skin from head to toe regularly.  Tell your health care provider about any new moles or changes in moles, especially if there is a change in a mole's shape or color.  Also tell your health care provider if you have a mole that is larger than the size of a pencil eraser.  Always use sunscreen. Apply sunscreen liberally and repeatedly throughout the day.  Protect yourself by wearing long sleeves, pants, a wide-brimmed hat, and sunglasses whenever you are outside. HEART DISEASE, DIABETES, AND HIGH BLOOD PRESSURE   Have your blood pressure checked at least every 1-2 years. High blood pressure causes heart disease and increases the risk of stroke.  If you are between 82 years and 62 years old, ask your health care provider if you should take aspirin to prevent strokes.  Have regular diabetes screenings. This involves taking a blood sample to check your fasting blood sugar level.  If you are at a normal weight and have a low risk for diabetes, have this test once every three years after 54 years of age.  If you are overweight and have a high risk for diabetes, consider being tested at a younger age or more often. PREVENTING INFECTION  Hepatitis B  If you have a higher risk for hepatitis B, you should be screened for this virus. You are  considered at high risk for hepatitis B if:  You were born in a country where hepatitis B is common. Ask your health care provider which countries are considered high risk.  Your parents were born in a high-risk country, and you have not been immunized against hepatitis B (hepatitis B vaccine).  You have HIV or AIDS.  You use needles to inject street drugs.  You live with someone who has hepatitis B.  You have had sex with someone who has hepatitis B.  You get hemodialysis treatment.  You take certain medicines for conditions, including cancer, organ transplantation, and autoimmune conditions. Hepatitis C  Blood testing is recommended for:  Everyone born from [birth year] through 1965.  Anyone with known risk factors for hepatitis C. Sexually transmitted infections (STIs)  You should be screened for sexually transmitted infections (STIs) including gonorrhea and chlamydia if:  You  are sexually active and are younger than 54 years of age.  You are older than 54 years of age and your health care provider tells you that you are at risk for this type of infection.  Your sexual activity has changed since you were last screened and you are at an increased risk for chlamydia or gonorrhea. Ask your health care provider if you are at risk.  If you do not have HIV, but are at risk, it may be recommended that you take a prescription medicine daily to prevent HIV infection. This is called pre-exposure prophylaxis (PrEP). You are considered at risk if:  You are sexually active and do not regularly use condoms or know the HIV status of your partner(s).  You take drugs by injection.  You are sexually active with a partner who has HIV. Talk with your health care provider about whether you are at high risk of being infected with HIV. If you choose to begin PrEP, you should first be tested for HIV. You should then be tested every 3 months for as long as you are taking PrEP.  PREGNANCY   If you are  premenopausal and you may become pregnant, ask your health care provider about preconception counseling.  If you may become pregnant, take 400 to 800 micrograms (mcg) of folic acid every day.  If you want to prevent pregnancy, talk to your health care provider about birth control (contraception). OSTEOPOROSIS AND MENOPAUSE   Osteoporosis is a disease in which the bones lose minerals and strength with aging. This can result in serious bone fractures. Your risk for osteoporosis can be identified using a bone density scan.  If you are 31 years of age or older, or if you are at risk for osteoporosis and fractures, ask your health care provider if you should be screened.  Ask your health care provider whether you should take a calcium or vitamin D supplement to lower your risk for osteoporosis.  Menopause may have certain physical symptoms and risks.  Hormone replacement therapy may reduce some of these symptoms and risks. Talk to your health care provider about whether hormone replacement therapy is right for you.  HOME CARE INSTRUCTIONS   Schedule regular health, dental, and eye exams.  Stay current with your immunizations.   Do not use any tobacco products including cigarettes, chewing tobacco, or electronic cigarettes.  If you are pregnant, do not drink alcohol.  If you are breastfeeding, limit how much and how often you drink alcohol.  Limit alcohol intake to no more than 1 drink per day for nonpregnant women. One drink equals 12 ounces of beer, 5 ounces of wine, or 1 ounces of hard liquor.  Do not use street drugs.  Do not share needles.  Ask your health care provider for help if you need support or information about quitting drugs.  Tell your health care provider if you often feel depressed.  Tell your health care provider if you have ever been abused or do not feel safe at home. Document Released: 05/21/2011 Document Revised: 03/22/2014 Document Reviewed:  10/07/2013 [facility] Patient Information 2015 [place], [place]. This information is not intended to replace advice given to you by your health care provider. Make sure you discuss any questions you have with your health care provider.      [employer]

## 2015-03-08 NOTE — Patient Instructions (Signed)
It was a pleasure meeting you today. I have refilled your prescription for Vesicare and have called in a referral to physical therapy. I would like you to follow up with me in 2-3 months for a complete physical. If you need anything before then, please let me know.   Health Maintenance Adopting a healthy lifestyle and getting preventive care can go a long way to promote health and wellness. Talk with your health care provider about what schedule of regular examinations is right for you. This is a good chance for you to check in with your provider about disease prevention and staying healthy. In between checkups, there are plenty of things you can do on your own. Experts have done a lot of research about which lifestyle changes and preventive measures are most likely to keep you healthy. Ask your health care provider for more information. WEIGHT AND DIET  Eat a healthy diet  Be sure to include plenty of vegetables, fruits, low-fat dairy products, and lean protein.  Do not eat a lot of foods high in solid fats, added sugars, or salt.  Get regular exercise. This is one of the most important things you can do for your health.  Most adults should exercise for at least 150 minutes each week. The exercise should increase your heart rate and make you sweat (moderate-intensity exercise).  Most adults should also do strengthening exercises at least twice a week. This is in addition to the moderate-intensity exercise.  Maintain a healthy weight  Body mass index (BMI) is a measurement that can be used to identify possible weight problems. It estimates body fat based on height and weight. Your health care provider can help determine your BMI and help you achieve or maintain a healthy weight.  For females 70 years of age and older:   A BMI below 18.5 is considered underweight.  A BMI of 18.5 to 24.9 is normal.  A BMI of 25 to 29.9 is considered overweight.  A BMI of 30 and above is considered obese.   Watch levels of cholesterol and blood lipids  You should start having your blood tested for lipids and cholesterol at 54 years of age, then have this test every 5 years.  You may need to have your cholesterol levels checked more often if:  Your lipid or cholesterol levels are high.  You are older than 54 years of age.  You are at high risk for heart disease.  CANCER SCREENING   Lung Cancer  Lung cancer screening is recommended for adults 44-59 years old who are at high risk for lung cancer because of a history of smoking.  A yearly low-dose CT scan of the lungs is recommended for people who:  Currently smoke.  Have quit within the past 15 years.  Have at least a 30-pack-year history of smoking. A pack year is smoking an average of one pack of cigarettes a day for 1 year.  Yearly screening should continue until it has been 15 years since you quit.  Yearly screening should stop if you develop a health problem that would prevent you from having lung cancer treatment.  Breast Cancer  Practice breast self-awareness. This means understanding how your breasts normally appear and feel.  It also means doing regular breast self-exams. Let your health care provider know about any changes, no matter how small.  If you are in your 20s or 30s, you should have a clinical breast exam (CBE) by a health care provider every 1-3 years  as part of a regular health exam.  If you are 32 or older, have a CBE every year. Also consider having a breast X-ray (mammogram) every year.  If you have a family history of breast cancer, talk to your health care provider about genetic screening.  If you are at high risk for breast cancer, talk to your health care provider about having an MRI and a mammogram every year.  Breast cancer gene (BRCA) assessment is recommended for women who have family members with BRCA-related cancers. BRCA-related cancers  include:  Breast.  Ovarian.  Tubal.  Peritoneal cancers.  Results of the assessment will determine the need for genetic counseling and BRCA1 and BRCA2 testing. Cervical Cancer Routine pelvic examinations to screen for cervical cancer are no longer recommended for nonpregnant women who are considered low risk for cancer of the pelvic organs (ovaries, uterus, and vagina) and who do not have symptoms. A pelvic examination may be necessary if you have symptoms including those associated with pelvic infections. Ask your health care provider if a screening pelvic exam is right for you.   The Pap test is the screening test for cervical cancer for women who are considered at risk.  If you had a hysterectomy for a problem that was not cancer or a condition that could lead to cancer, then you no longer need Pap tests.  If you are older than 65 years, and you have had normal Pap tests for the past 10 years, you no longer need to have Pap tests.  If you have had past treatment for cervical cancer or a condition that could lead to cancer, you need Pap tests and screening for cancer for at least 20 years after your treatment.  If you no longer get a Pap test, assess your risk factors if they change (such as having a new sexual partner). This can affect whether you should start being screened again.  Some women have medical problems that increase their chance of getting cervical cancer. If this is the case for you, your health care provider may recommend more frequent screening and Pap tests.  The human papillomavirus (HPV) test is another test that may be used for cervical cancer screening. The HPV test looks for the virus that can cause cell changes in the cervix. The cells collected during the Pap test can be tested for HPV.  The HPV test can be used to screen women 28 years of age and older. Getting tested for HPV can extend the interval between normal Pap tests from three to five years.  An HPV  test also should be used to screen women of any age who have unclear Pap test results.  After 54 years of age, women should have HPV testing as often as Pap tests.  Colorectal Cancer  This type of cancer can be detected and often prevented.  Routine colorectal cancer screening usually begins at 54 years of age and continues through 54 years of age.  Your health care provider may recommend screening at an earlier age if you have risk factors for colon cancer.  Your health care provider may also recommend using home test kits to check for hidden blood in the stool.  A small camera at the end of a tube can be used to examine your colon directly (sigmoidoscopy or colonoscopy). This is done to check for the earliest forms of colorectal cancer.  Routine screening usually begins at age 60.  Direct examination of the colon should be repeated every  5-10 years through 54 years of age. However, you may need to be screened more often if early forms of precancerous polyps or small growths are found. Skin Cancer  Check your skin from head to toe regularly.  Tell your health care provider about any new moles or changes in moles, especially if there is a change in a mole's shape or color.  Also tell your health care provider if you have a mole that is larger than the size of a pencil eraser.  Always use sunscreen. Apply sunscreen liberally and repeatedly throughout the day.  Protect yourself by wearing long sleeves, pants, a wide-brimmed hat, and sunglasses whenever you are outside. HEART DISEASE, DIABETES, AND HIGH BLOOD PRESSURE   Have your blood pressure checked at least every 1-2 years. High blood pressure causes heart disease and increases the risk of stroke.  If you are between 2 years and 56 years old, ask your health care provider if you should take aspirin to prevent strokes.  Have regular diabetes screenings. This involves taking a blood sample to check your fasting blood sugar  level.  If you are at a normal weight and have a low risk for diabetes, have this test once every three years after 54 years of age.  If you are overweight and have a high risk for diabetes, consider being tested at a younger age or more often. PREVENTING INFECTION  Hepatitis B  If you have a higher risk for hepatitis B, you should be screened for this virus. You are considered at high risk for hepatitis B if:  You were born in a country where hepatitis B is common. Ask your health care provider which countries are considered high risk.  Your parents were born in a high-risk country, and you have not been immunized against hepatitis B (hepatitis B vaccine).  You have HIV or AIDS.  You use needles to inject street drugs.  You live with someone who has hepatitis B.  You have had sex with someone who has hepatitis B.  You get hemodialysis treatment.  You take certain medicines for conditions, including cancer, organ transplantation, and autoimmune conditions. Hepatitis C  Blood testing is recommended for:  Everyone born from [birth year] through 1965.  Anyone with known risk factors for hepatitis C. Sexually transmitted infections (STIs)  You should be screened for sexually transmitted infections (STIs) including gonorrhea and chlamydia if:  You are sexually active and are younger than 54 years of age.  You are older than 54 years of age and your health care provider tells you that you are at risk for this type of infection.  Your sexual activity has changed since you were last screened and you are at an increased risk for chlamydia or gonorrhea. Ask your health care provider if you are at risk.  If you do not have HIV, but are at risk, it may be recommended that you take a prescription medicine daily to prevent HIV infection. This is called pre-exposure prophylaxis (PrEP). You are considered at risk if:  You are sexually active and do not regularly use condoms or know the HIV status  of your partner(s).  You take drugs by injection.  You are sexually active with a partner who has HIV. Talk with your health care provider about whether you are at high risk of being infected with HIV. If you choose to begin PrEP, you should first be tested for HIV. You should then be tested every 3 months for as long as you  are taking PrEP.  PREGNANCY   If you are premenopausal and you may become pregnant, ask your health care provider about preconception counseling.  If you may become pregnant, take 400 to 800 micrograms (mcg) of folic acid every day.  If you want to prevent pregnancy, talk to your health care provider about birth control (contraception). OSTEOPOROSIS AND MENOPAUSE   Osteoporosis is a disease in which the bones lose minerals and strength with aging. This can result in serious bone fractures. Your risk for osteoporosis can be identified using a bone density scan.  If you are 74 years of age or older, or if you are at risk for osteoporosis and fractures, ask your health care provider if you should be screened.  Ask your health care provider whether you should take a calcium or vitamin D supplement to lower your risk for osteoporosis.  Menopause may have certain physical symptoms and risks.  Hormone replacement therapy may reduce some of these symptoms and risks. Talk to your health care provider about whether hormone replacement therapy is right for you.  HOME CARE INSTRUCTIONS   Schedule regular health, dental, and eye exams.  Stay current with your immunizations.   Do not use any tobacco products including cigarettes, chewing tobacco, or electronic cigarettes.  If you are pregnant, do not drink alcohol.  If you are breastfeeding, limit how much and how often you drink alcohol.  Limit alcohol intake to no more than 1 drink per day for nonpregnant women. One drink equals 12 ounces of beer, 5 ounces of wine, or 1 ounces of hard liquor.  Do not use street  drugs.  Do not share needles.  Ask your health care provider for help if you need support or information about quitting drugs.  Tell your health care provider if you often feel depressed.  Tell your health care provider if you have ever been abused or do not feel safe at home. Document Released: 05/21/2011 Document Revised: 03/22/2014 Document Reviewed: 10/07/2013 [facility] Patient Information 2015 [place], [place]. This information is not intended to replace advice given to you by your health care provider. Make sure you discuss any questions you have with your health care provider.

## 2015-03-08 NOTE — Progress Notes (Signed)
Pre visit review using our clinic review tool, if applicable. No additional management support is needed unless otherwise documented below in the visit note. 

## 2015-03-09 ENCOUNTER — Ambulatory Visit (HOSPITAL_COMMUNITY): Payer: Commercial Managed Care - HMO

## 2015-03-10 ENCOUNTER — Other Ambulatory Visit: Payer: Self-pay

## 2015-03-10 DIAGNOSIS — Z1231 Encounter for screening mammogram for malignant neoplasm of breast: Secondary | ICD-10-CM

## 2015-03-11 ENCOUNTER — Ambulatory Visit (HOSPITAL_COMMUNITY): Payer: Commercial Managed Care - HMO

## 2015-03-13 ENCOUNTER — Other Ambulatory Visit: Payer: Self-pay | Admitting: Family

## 2015-03-14 ENCOUNTER — Ambulatory Visit (HOSPITAL_COMMUNITY): Payer: Commercial Managed Care - HMO

## 2015-03-14 ENCOUNTER — Telehealth: Payer: Self-pay | Admitting: Adult Health

## 2015-03-14 NOTE — Telephone Encounter (Signed)
FYI

## 2015-03-14 NOTE — Telephone Encounter (Signed)
Pt said she will not be going to see Dr Nickola Major the Rheumatology . Said she saw one last year and does not need to see one again.

## 2015-03-14 NOTE — Telephone Encounter (Signed)
Pls advise on refill. 

## 2015-03-16 ENCOUNTER — Ambulatory Visit (HOSPITAL_COMMUNITY): Payer: Commercial Managed Care - HMO

## 2015-03-18 ENCOUNTER — Other Ambulatory Visit: Payer: Commercial Managed Care - HMO

## 2015-03-18 ENCOUNTER — Ambulatory Visit (HOSPITAL_COMMUNITY): Payer: Commercial Managed Care - HMO

## 2015-03-18 NOTE — Patient Outreach (Signed)
Patient chart reviewed for medial appointment compliance. According to [facility] documentation, patient has been compliant with medical appointments which assist with chronic disese management.

## 2015-03-21 ENCOUNTER — Ambulatory Visit: Payer: Commercial Managed Care - HMO | Admitting: Endocrinology

## 2015-03-21 ENCOUNTER — Inpatient Hospital Stay (HOSPITAL_COMMUNITY): Admission: RE | Admit: 2015-03-21 | Payer: Commercial Managed Care - HMO | Source: Ambulatory Visit

## 2015-03-21 ENCOUNTER — Other Ambulatory Visit: Payer: Self-pay

## 2015-03-21 NOTE — Patient Outreach (Signed)
This RNCM spoke with patient by telephone today. Patient was very upbeat and happy about her progress. Patient states she is moving back to [place] on April 20, 2015 to be closer to her sons. She states further her cousin is still with her until tomorrow, when she (her cousin) plans to return to [place] to prepare for her return.   Patient and this RNCM discussed her care plan. Patient has not had a hospitalization since being discharged in early March.   Patient has met all her medical appointment engagements.   Patient agreed to discharge, was very thankful and will call if further case management needs arise.

## 2015-03-22 ENCOUNTER — Other Ambulatory Visit: Payer: Self-pay | Admitting: Internal Medicine

## 2015-03-22 ENCOUNTER — Ambulatory Visit: Payer: Commercial Managed Care - HMO | Admitting: Endocrinology

## 2015-03-22 NOTE — Patient Outreach (Signed)
[facility]) Care Management  03/22/2015  Madison [name] November 11, 1961 [id number]   Received notification from Emilia Beck, RN to close case due to goals mets.  [name]. [facility] Care Management [facility] CM Assistant Phone: [phone] Fax: [phone]

## 2015-03-23 ENCOUNTER — Ambulatory Visit (HOSPITAL_COMMUNITY): Payer: Commercial Managed Care - HMO

## 2015-03-24 ENCOUNTER — Ambulatory Visit: Payer: Commercial Managed Care - HMO | Admitting: Endocrinology

## 2015-03-25 ENCOUNTER — Ambulatory Visit (HOSPITAL_COMMUNITY): Payer: Commercial Managed Care - HMO

## 2015-03-25 ENCOUNTER — Ambulatory Visit
Admission: RE | Admit: 2015-03-25 | Discharge: 2015-03-25 | Disposition: A | Discharge status: Home / Self Care | Payer: Commercial Managed Care - HMO | Source: Ambulatory Visit

## 2015-03-25 DIAGNOSIS — Z1231 Encounter for screening mammogram for malignant neoplasm of breast: Secondary | ICD-10-CM

## 2015-03-28 ENCOUNTER — Ambulatory Visit (HOSPITAL_COMMUNITY): Payer: Commercial Managed Care - HMO

## 2015-03-29 ENCOUNTER — Telehealth: Payer: Self-pay | Admitting: Adult Health

## 2015-03-29 NOTE — Telephone Encounter (Signed)
Bailey Mech, RN handles the coumadin.  Can you let me know when you want pt to come in for physical?

## 2015-03-29 NOTE — Telephone Encounter (Signed)
Pt can come in anytime for the physical.  Please schedule

## 2015-03-29 NOTE — Telephone Encounter (Signed)
Pt had coum checked 4/19( Last coum was 2.9) and is waiting on a cb to let her know when to com in for another coum check' Pt also needs to know when to sch her cpe.

## 2015-03-30 ENCOUNTER — Ambulatory Visit (HOSPITAL_COMMUNITY): Payer: Commercial Managed Care - HMO

## 2015-03-30 ENCOUNTER — Telehealth: Payer: Self-pay | Admitting: General Practice

## 2015-03-30 NOTE — Telephone Encounter (Signed)
Line busy

## 2015-04-01 ENCOUNTER — Ambulatory Visit (HOSPITAL_COMMUNITY): Payer: Commercial Managed Care - HMO

## 2015-04-01 NOTE — Progress Notes (Signed)
Patient ID: Madison [name], female   DOB: 11/06/1961, 54 y.o.   MRN: [id number]   54 y.o. with previous care in [place]. History of bicuspid AV with AVR 2012. Chronic coumadin. 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 Currently has some aphasiia and left hand weakness but overall does well. Needs a primary care doctor Needs refills on coumadin and to be established in coumadin clinic. No dyspnea, chest pain or syncope. Sister drove her to her appt today. Review of [place] records show no CAD at cath prior to surgery   IDDM sees Loleta Chance 4/15 Study Conclusions  - Left ventricle: The cavity size was normal. There was moderate concentric hypertrophy. Systolic function was normal. The estimated ejection fraction was in the range of 60% to 65%. Wall motion was normal; there were no regional wall motion abnormalities. Doppler parameters are consistent with abnormal left ventricular relaxation (grade 1 diastolic dysfunction). - Aortic valve: A mechanical aortic valve sits well inthe aortic position. There is no aortic insufficiency and no paravalvular leak. The peak and mean transaortic gradients are 15 and 26 mmHg, respectively and are normal for this type of prosthetic valve. - Aortic root: The aortic root was normal in size. - Mitral valve: Mild regurgitation. - Right ventricle: Systolic function was normal. - Tricuspid valve: No regurgitation. - Pulmonary arteries: Systolic pressure was within the normal range. - Inferior vena cava: The vessel was normal in size. - Pericardium, extracardiac: There was no pericardial effusion.  Depressed. Because of strokes has had two childeren age 40 and 71 taken from her They are living in [place].  54 yo was adopted Gets around with [employer].  Wants to do cardiac  rehab  11/20/14  Had stent to mid LAD by Dr Herbie Baltimore no angina    ROS: Denies fever, malais, weight loss, blurry vision, decreased visual acuity, cough, sputum, SOB, hemoptysis, pleuritic pain, palpitaitons, heartburn, abdominal pain, melena, lower extremity edema, claudication, or rash.  All other systems reviewed and negative   General: Affect appropriate Healthy:  appears stated age HEENT: normal Neck supple with no adenopathy JVP normal no bruits no thyromegaly Lungs clear with no wheezing and good diaphragmatic motion Heart:  S1/S2 click no AR  murmur,rub, gallop or click PMI normal Abdomen: benighn, BS positve, no tenderness, no AAA no bruit.  No HSM or HJR Distal pulses intact with no bruits No edema Neuro right facial droop old  Skin warm and dry No muscular weakness  Medications Current Outpatient Prescriptions  Medication Sig Dispense Refill  . ACCU-CHEK SOFTCLIX LANCETS lancets     . aspirin 81 MG tablet Take 1 tablet (81 mg total) by mouth daily. 30 tablet   . baclofen (LIORESAL) 10 MG tablet TAKE 1 TABLET (10 MG TOTAL) BY MOUTH 2 (TWO) TIMES DAILY. 60 tablet 1  . clopidogrel (PLAVIX) 75 MG tablet Take 1 tablet (75 mg total) by mouth daily with breakfast. 30 tablet 2  . escitalopram (LEXAPRO) 20 MG tablet Take 20 mg by mouth daily.    . feeding supplement, GLUCERNA SHAKE, (GLUCERNA SHAKE) LIQD Take 237 mLs by mouth 2 (two) times daily between meals. 60 Can 0  . glucagon (GLUCAGON EMERGENCY) 1 MG injection Inject 1 mg into the vein once as needed. 1 each 5  . glucose blood (ACCU-CHEK AVIVA) test strip Use to check blood  sugar 5 times per day Dx Code E10.10 And lancets 5/day 150 each 11  . insulin aspart (NOVOLOG FLEXPEN) 100 UNIT/ML FlexPen Inject 2 units for any blood sugar over 250 15 mL 3  . Insulin NPH, Human,, Isophane, (HUMULIN N) 100 UNIT/ML Kiwkpen Inject 25 Units into the skin every morning. And pen needles, 1/day 15 mL 11  . lisinopril (PRINIVIL,ZESTRIL) 2.5 MG  tablet Take 2.5 mg by mouth daily.  0  . methotrexate (RHEUMATREX) 2.5 MG tablet Take 1 tablet (2.5 mg total) by mouth 2 (two) times a week. Caution:Chemotherapy. Protect from light. 24 tablet 0  . metoprolol tartrate (LOPRESSOR) 25 MG tablet Take 0.5 tablets (12.5 mg total) by mouth 2 (two) times daily. (Patient taking differently: Take 25 mg by mouth daily. ) 60 tablet 0  . omeprazole (PRILOSEC) 40 MG capsule TAKE 1 CAPSULE (40 MG TOTAL) BY MOUTH DAILY. 30 capsule 6  . pravastatin (PRAVACHOL) 20 MG tablet TAKE 1 TABLET BY MOUTH EVERY DAY 90 tablet 0  . promethazine (PHENERGAN) 25 MG tablet Take 1 tablet (25 mg total) by mouth every 4 (four) hours as needed for nausea or vomiting. 60 tablet 0  . solifenacin (VESICARE) 5 MG tablet Take 0.5 tablets (2.5 mg total) by mouth daily. 45 tablet 3  . traMADol (ULTRAM) 50 MG tablet TAKE 1-2 TABLETS AT BEDTIME AS NEEDED FOR SLEEP 90 tablet 0  . warfarin (COUMADIN) 5 MG tablet Take 1-1.5 tablets (5-7.5 mg total) by mouth daily at 6 PM. Take 5 mg Daily By Mouth on , Tues, Thurs, Fri, Sat & Sun, Take 7.5 mg By Mouth on mondays and Wednesdays . (Patient taking differently: Take 5-7.5 mg by mouth daily at 6 PM. Take 5 mg Daily By Mouth on Mon, Tues, Thurs, Fri, Sat & Sun, Take 7.5 mg By Mouth on Wednesdays .) 60 tablet 0   No current facility-administered medications for this visit.    Allergies Celebrex and Detrol  Family History: Family History  Problem Relation Age of Onset  . Stroke    . Heart disease Mother   . Heart disease Sister     Social History: History   Social History  . Marital Status: Divorced    Spouse Name: N/A  . Number of Children: N/A  . Years of Education: N/A   Occupational History  . Not on file.   Social History Main Topics  . Smoking status: Never Smoker   . Smokeless tobacco: Never Used  . Alcohol Use: No  . Drug Use: No  . Sexual Activity: Not on file   Other Topics Concern  . Not on file   Social History  Narrative   Divroced and lives at home.    Has two sons who live in [place] at this time. (21&8 y/o).    Has a cat    Walks a lot.       She is having great success with her diet       Working with exercises that were given to her by PT/OT.     Past Surgical History  Procedure Laterality Date  . Cesarean section  1995  . Foot fracture surgery    . Knee arthroscopy Right   . Neuroplasty / transposition median nerve at carpal tunnel    . Left heart catheterization with coronary angiogram N/A 12/08/2014    Procedure: LEFT HEART CATHETERIZATION WITH CORONARY ANGIOGRAM;  Surgeon: Iran Ouch, MD;  Location: [facility] CATH LAB;  Service: Cardiovascular;  Laterality: N/A;  .  Percutaneous coronary stent intervention (pci-s) N/A 12/09/2014    Procedure: PERCUTANEOUS CORONARY STENT INTERVENTION (PCI-S);  Surgeon: [name], MD;  Location: [facility] CATH LAB;  Service: Cardiovascular;  Laterality: N/A;  . Cardiac catheterization  12/08/2014  . Coronary angioplasty with stent placement  12/09/2014  . Aortic valve replacement (avr)/coronary artery bypass grafting (cabg)  "2014"    [name] 03/08/2014  . Fracture surgery    . Tubal ligation  1995  . Cataract extraction Left     Past Medical History  Diagnosis Date  . CAD (coronary artery disease)   . Obesity   . Hypercholesteremia   . HTN (hypertension)   . Dyslipidemia   . Aortic stenosis   . Thrombophlebitis   . Heart murmur   . Myocardial infarction 12/2014  . Pneumonia 11/2014; 12/2014  . Diabetes dx'd 1982    "went straight to insulin"  . GERD (gastroesophageal reflux disease)   . 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 Rheumatoid arthritis(714.0)     "hands" (01/19/2015)  . Anxiety   . Depression   . Allergy   . CHF (congestive heart failure)     Electrocardiogram:  01/25/15  SR rate 82  Nonspecific ST/T wave changes   Assessment and Plan AVR:  2012 normal gradients by  echo no AR  SBE prophlaxis normal exam stable Coumadin:  Followed at [place] office no bleeding issues Rx CAD:  No angina stent to mid LAD 11/2014  On ASA/Plavix and coumadin CVA:  LUE weakness and aphasia residual  Cardiac rehab  DM:  Discussed low carb diet.  Target hemoglobin A1c is 6.5 or less.  Continue current medications.  F/u with me in a year   Charlton Haws

## 2015-04-04 ENCOUNTER — Ambulatory Visit (HOSPITAL_COMMUNITY): Payer: Commercial Managed Care - HMO

## 2015-04-04 ENCOUNTER — Encounter: Payer: Self-pay | Admitting: Cardiovascular Disease

## 2015-04-04 ENCOUNTER — Other Ambulatory Visit: Payer: Self-pay | Admitting: Family

## 2015-04-04 ENCOUNTER — Ambulatory Visit (INDEPENDENT_AMBULATORY_CARE_PROVIDER_SITE_OTHER): Payer: Commercial Managed Care - HMO | Admitting: Cardiovascular Disease

## 2015-04-04 ENCOUNTER — Encounter: Payer: Self-pay | Admitting: Endocrinology

## 2015-04-04 ENCOUNTER — Ambulatory Visit (INDEPENDENT_AMBULATORY_CARE_PROVIDER_SITE_OTHER): Payer: Commercial Managed Care - HMO | Admitting: Endocrinology

## 2015-04-04 VITALS — BP 124/66 | HR 88 | Temp 98.7°F | Wt 162.0 lb

## 2015-04-04 VITALS — BP 108/52 | HR 52 | Ht 63.0 in | Wt 161.1 lb

## 2015-04-04 DIAGNOSIS — I2583 Coronary atherosclerosis due to lipid rich plaque: Principal | ICD-10-CM

## 2015-04-04 DIAGNOSIS — E1049 Type 1 diabetes mellitus with other diabetic neurological complication: Secondary | ICD-10-CM | POA: Diagnosis not present

## 2015-04-04 DIAGNOSIS — I2510 Atherosclerotic heart disease of native coronary artery without angina pectoris: Secondary | ICD-10-CM | POA: Diagnosis not present

## 2015-04-04 LAB — HEMOGLOBIN A1C: Hgb A1c MFr Bld: 10.6 % — ABNORMAL HIGH (ref 4.6–6.5)

## 2015-04-04 MED ORDER — INSULIN ISOPHANE HUMAN 100 UNIT/ML KWIKPEN: 28.0000 [IU] | PEN_INJECTOR | SUBCUTANEOUS | Status: DC

## 2015-04-04 NOTE — Progress Notes (Signed)
Subjective:    Patient ID: Madison [name], female    DOB: [birth year], 54 y.o.   MRN: [id number]  HPI Pt returns for f/u of diabetes mellitus: DM type: Insulin-requiring type 2 Dx'ed: 1981 Complications: polyneuropathy, retinopathy, CVA's, and CAD.   Therapy: insulin since dx.   GDM: never. DKA: last episode was in 2016.  Severe hypoglycemia: last episode was in 2014.   Pancreatitis: never.  Other: she was on insulin pump rx until CVA's prevented her from using the device in early 2014; in early 2015, she was changed to a simple insulin schedule, due to poor results with multiple daily injections.  In 2015, she change from levemir to NPH, due to the pattern of her cbg's.  Interval history: pt states she feels well in general. She brings her meter.  cbg's vary from 85-400.  There is no trend throughout the day, but she says cbg's are heavily influenced by meals.  She takes novolog, 5 units qd, with the evening meal.   Past Medical History  Diagnosis Date  . CAD (coronary artery disease)   . Obesity   . Hypercholesteremia   . HTN (hypertension)   . Dyslipidemia   . Aortic stenosis   . Thrombophlebitis   . Heart murmur   . Myocardial infarction 12/2014  . Pneumonia 11/2014; 12/2014  . Diabetes dx'd 1982    "went straight to insulin"  . GERD (gastroesophageal reflux disease)   . 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 Rheumatoid arthritis(714.0)     "hands" (01/19/2015)  . Anxiety   . Depression   . Allergy   . CHF (congestive heart failure)     Past Surgical History  Procedure Laterality Date  . Cesarean section  1995  . Foot fracture surgery    . Knee arthroscopy Right   . Neuroplasty / transposition median nerve at carpal tunnel    . Left heart catheterization with coronary angiogram N/A 12/08/2014    Procedure: LEFT HEART CATHETERIZATION WITH CORONARY ANGIOGRAM;  Surgeon: Iran Ouch, MD;  Location: [facility]  CATH LAB;  Service: Cardiovascular;  Laterality: N/A;  . Percutaneous coronary stent intervention (pci-s) N/A 12/09/2014    Procedure: PERCUTANEOUS CORONARY STENT INTERVENTION (PCI-S);  Surgeon: [name], MD;  Location: [facility] CATH LAB;  Service: Cardiovascular;  Laterality: N/A;  . Cardiac catheterization  12/08/2014  . Coronary angioplasty with stent placement  12/09/2014  . Aortic valve replacement (avr)/coronary artery bypass grafting (cabg)  "2014"    [name] 03/08/2014  . Fracture surgery    . Tubal ligation  1995  . Cataract extraction Left     History   Social History  . Marital Status: Divorced    Spouse Name: N/A  . Number of Children: N/A  . Years of Education: N/A   Occupational History  . Not on file.   Social History Main Topics  . Smoking status: Never Smoker   . Smokeless tobacco: Never Used  . Alcohol Use: No  . Drug Use: No  . Sexual Activity: Not on file   Other Topics Concern  . Not on file   Social History Narrative   Divroced and lives at home.    Has two sons who live in [place] at this time. (21&8 y/o).    Has a cat    Walks a lot.       She is having great success with  her diet       Working with exercises that were given to her by PT/OT.     Current Outpatient Prescriptions on File Prior to Visit  Medication Sig Dispense Refill  . ACCU-CHEK SOFTCLIX LANCETS lancets     . aspirin 81 MG tablet Take 1 tablet (81 mg total) by mouth daily. 30 tablet   . baclofen (LIORESAL) 10 MG tablet TAKE 1 TABLET (10 MG TOTAL) BY MOUTH 2 (TWO) TIMES DAILY. 60 tablet 1  . clopidogrel (PLAVIX) 75 MG tablet Take 1 tablet (75 mg total) by mouth daily with breakfast. 30 tablet 2  . escitalopram (LEXAPRO) 20 MG tablet Take 20 mg by mouth daily.    . feeding supplement, GLUCERNA SHAKE, (GLUCERNA SHAKE) LIQD Take 237 mLs by mouth 2 (two) times daily between meals. 60 Can 0  . glucagon (GLUCAGON EMERGENCY) 1 MG injection Inject 1 mg into the vein once as needed. 1 each 5    . glucose blood (ACCU-CHEK AVIVA) test strip Use to check blood sugar 5 times per day Dx Code E10.10 And lancets 5/day 150 each 11  . insulin aspart (NOVOLOG FLEXPEN) 100 UNIT/ML FlexPen Inject 2 units for any blood sugar over 250 15 mL 3  . methotrexate (RHEUMATREX) 2.5 MG tablet Take 1 tablet (2.5 mg total) by mouth 2 (two) times a week. Caution:Chemotherapy. Protect from light. 24 tablet 0  . metoprolol tartrate (LOPRESSOR) 25 MG tablet Take 0.5 tablets (12.5 mg total) by mouth 2 (two) times daily. (Patient taking differently: Take 25 mg by mouth daily. ) 60 tablet 0  . omeprazole (PRILOSEC) 40 MG capsule TAKE 1 CAPSULE (40 MG TOTAL) BY MOUTH DAILY. 30 capsule 6  . pravastatin (PRAVACHOL) 20 MG tablet TAKE 1 TABLET BY MOUTH EVERY DAY 90 tablet 0  . promethazine (PHENERGAN) 25 MG tablet Take 1 tablet (25 mg total) by mouth every 4 (four) hours as needed for nausea or vomiting. 60 tablet 0  . solifenacin (VESICARE) 5 MG tablet Take 0.5 tablets (2.5 mg total) by mouth daily. 45 tablet 3  . traMADol (ULTRAM) 50 MG tablet TAKE 1-2 TABLETS AT BEDTIME AS NEEDED FOR SLEEP 90 tablet 0  . warfarin (COUMADIN) 5 MG tablet Take 1-1.5 tablets (5-7.5 mg total) by mouth daily at 6 PM. Take 5 mg Daily By Mouth on , Tues, Thurs, Fri, Sat & Sun, Take 7.5 mg By Mouth on mondays and Wednesdays . (Patient taking differently: Take 5-7.5 mg by mouth daily at 6 PM. Take 5 mg Daily By Mouth on Mon, Tues, Thurs, Fri, Sat & Sun, Take 7.5 mg By Mouth on Wednesdays .) 60 tablet 0   No current facility-administered medications on file prior to visit.    Allergies  Allergen Reactions  . Celebrex [Celecoxib] Rash  . Detrol [Tolterodine] Hives    Family History  Problem Relation Age of Onset  . Stroke    . Heart disease Mother   . Heart disease Sister     BP 124/66 mmHg  Pulse 88  Temp(Src) 98.7 F (37.1 C) (Oral)  Wt 162 lb (73.483 kg)  SpO2 98%   Review of Systems She denies hypoglycemia    Objective:    Physical Exam VITAL SIGNS:  See vs page GENERAL: no distress Pulses: dorsalis pedis intact bilat.   MSK: no deformity of the feet CV: no leg edema Skin:  no ulcer on the feet.  normal color and temp on the feet. Neuro: sensation is intact to touch on  the feet   Lab Results  Component Value Date   HGBA1C 10.6* 04/04/2015       Assessment & Plan:  DM: glycemic control is worse  Patient is advised the following: Patient Instructions  check your blood sugar twice a day.  vary the time of day when you check, between before the 3 meals, and at bedtime.  also check if you have symptoms of your blood sugar being too high or too low.  please keep a record of the readings and bring it to your next appointment here.  You can write it on any piece of paper.  please call us sooner if your blood sugar goes below 70, or if you have a lot of readings over 200.    On this type of insulin schedule, you should eat meals on a regular schedule.  If a meal is missed or significantly delayed, your blood sugar could go low. Please come back for a follow-up appointment in 2 months.     Please increase the NPH to 28 units each morning.  Please take novolog, only when your blood sugar is high (2 units for any blood sugar over 250).

## 2015-04-04 NOTE — Patient Instructions (Signed)
Medication Instructions:  NO CHANGES  Labwork: NONE  Testing/Procedures: NONE  Follow-Up: Your physician wants you to follow-up in: YEAR WITH  DR NISHAN You will receive a reminder letter in the mail two months in advance. If you don't receive a letter, please call our office to schedule the follow-up appointment.   Any Other Special Instructions Will Be Listed Below (If Applicable).   

## 2015-04-04 NOTE — Patient Instructions (Addendum)
check your blood sugar twice a day.  vary the time of day when you check, between before the 3 meals, and at bedtime.  also check if you have symptoms of your blood sugar being too high or too low.  please keep a record of the readings and bring it to your next appointment here.  You can write it on any piece of paper.  please call us sooner if your blood sugar goes below 70, or if you have a lot of readings over 200.    On this type of insulin schedule, you should eat meals on a regular schedule.  If a meal is missed or significantly delayed, your blood sugar could go low. Please come back for a follow-up appointment in 2 months.     Please increase the NPH to 28 units each morning.  Please take novolog, only when your blood sugar is high (2 units for any blood sugar over 250).

## 2015-04-06 ENCOUNTER — Ambulatory Visit (HOSPITAL_COMMUNITY): Payer: Commercial Managed Care - HMO

## 2015-04-07 ENCOUNTER — Ambulatory Visit (INDEPENDENT_AMBULATORY_CARE_PROVIDER_SITE_OTHER): Payer: Commercial Managed Care - HMO | Admitting: General Practice

## 2015-04-07 DIAGNOSIS — Z954 Presence of other heart-valve replacement: Secondary | ICD-10-CM | POA: Diagnosis not present

## 2015-04-07 DIAGNOSIS — R300 Dysuria: Secondary | ICD-10-CM | POA: Diagnosis not present

## 2015-04-07 DIAGNOSIS — Z952 Presence of prosthetic heart valve: Secondary | ICD-10-CM

## 2015-04-07 LAB — POCT URINALYSIS DIPSTICK
Bilirubin, UA: NEGATIVE
KETONES UA: NEGATIVE
Nitrite, UA: POSITIVE
Protein, UA: NEGATIVE
SPEC GRAV UA: 1.015
UROBILINOGEN UA: 1
pH, UA: 7

## 2015-04-07 LAB — POCT INR: INR: 1.1

## 2015-04-07 NOTE — Progress Notes (Signed)
Pre visit review using our clinic review tool, if applicable. No additional management support is needed unless otherwise documented below in the visit note. 

## 2015-04-08 ENCOUNTER — Ambulatory Visit (HOSPITAL_COMMUNITY): Payer: Commercial Managed Care - HMO

## 2015-04-10 ENCOUNTER — Telehealth: Payer: Self-pay | Admitting: Adult Health

## 2015-04-10 ENCOUNTER — Other Ambulatory Visit: Payer: Self-pay | Admitting: Adult Health

## 2015-04-10 LAB — URINE CULTURE

## 2015-04-10 MED ORDER — CEPHALEXIN 500 MG PO CAPS: 500.0000 mg | ORAL_CAPSULE | Freq: Two times a day (BID) | ORAL | Status: DC

## 2015-04-10 NOTE — Telephone Encounter (Signed)
Prescription for Keflex sent to pharmacy for her UTI. Take twice a day for 10 days.

## 2015-04-11 ENCOUNTER — Ambulatory Visit (HOSPITAL_COMMUNITY): Payer: Commercial Managed Care - HMO

## 2015-04-11 NOTE — Telephone Encounter (Signed)
Called and spoke with pt and pt is aware.  

## 2015-04-13 ENCOUNTER — Ambulatory Visit (HOSPITAL_COMMUNITY): Payer: Commercial Managed Care - HMO

## 2015-04-14 ENCOUNTER — Ambulatory Visit (INDEPENDENT_AMBULATORY_CARE_PROVIDER_SITE_OTHER): Payer: Commercial Managed Care - HMO | Admitting: General Practice

## 2015-04-14 DIAGNOSIS — Z952 Presence of prosthetic heart valve: Secondary | ICD-10-CM

## 2015-04-14 DIAGNOSIS — Z954 Presence of other heart-valve replacement: Secondary | ICD-10-CM | POA: Diagnosis not present

## 2015-04-14 LAB — POCT INR: INR: 3.2

## 2015-04-14 NOTE — Progress Notes (Signed)
Pre visit review using our clinic review tool, if applicable. No additional management support is needed unless otherwise documented below in the visit note. 

## 2015-04-15 ENCOUNTER — Ambulatory Visit (HOSPITAL_COMMUNITY): Payer: Commercial Managed Care - HMO

## 2015-04-18 DIAGNOSIS — E139 Other specified diabetes mellitus without complications: Secondary | ICD-10-CM | POA: Diagnosis not present

## 2015-04-18 DIAGNOSIS — I6330 Cerebral infarction due to thrombosis of unspecified cerebral artery: Secondary | ICD-10-CM | POA: Diagnosis not present

## 2015-04-18 DIAGNOSIS — I10 Essential (primary) hypertension: Secondary | ICD-10-CM | POA: Diagnosis not present

## 2015-04-19 DIAGNOSIS — I10 Essential (primary) hypertension: Secondary | ICD-10-CM | POA: Diagnosis not present

## 2015-04-19 DIAGNOSIS — I6330 Cerebral infarction due to thrombosis of unspecified cerebral artery: Secondary | ICD-10-CM | POA: Diagnosis not present

## 2015-04-19 DIAGNOSIS — E139 Other specified diabetes mellitus without complications: Secondary | ICD-10-CM | POA: Diagnosis not present

## 2015-04-20 ENCOUNTER — Ambulatory Visit (HOSPITAL_COMMUNITY): Payer: Commercial Managed Care - HMO

## 2015-04-22 ENCOUNTER — Ambulatory Visit (HOSPITAL_COMMUNITY): Payer: Commercial Managed Care - HMO

## 2015-04-24 ENCOUNTER — Other Ambulatory Visit: Payer: Self-pay | Admitting: Internal Medicine

## 2015-04-25 ENCOUNTER — Ambulatory Visit (HOSPITAL_COMMUNITY): Payer: Commercial Managed Care - HMO

## 2015-04-27 ENCOUNTER — Ambulatory Visit (HOSPITAL_COMMUNITY): Payer: Commercial Managed Care - HMO

## 2015-04-28 ENCOUNTER — Other Ambulatory Visit: Payer: Self-pay | Admitting: Internal Medicine

## 2015-04-28 DIAGNOSIS — E11349 Type 2 diabetes mellitus with severe nonproliferative diabetic retinopathy without macular edema: Secondary | ICD-10-CM | POA: Diagnosis not present

## 2015-04-28 LAB — HM DIABETES EYE EXAM

## 2015-04-29 ENCOUNTER — Ambulatory Visit (HOSPITAL_COMMUNITY): Payer: Commercial Managed Care - HMO

## 2015-04-29 ENCOUNTER — Encounter: Payer: Self-pay | Admitting: Adult Health

## 2015-04-30 DIAGNOSIS — I6330 Cerebral infarction due to thrombosis of unspecified cerebral artery: Secondary | ICD-10-CM | POA: Diagnosis not present

## 2015-04-30 DIAGNOSIS — E139 Other specified diabetes mellitus without complications: Secondary | ICD-10-CM | POA: Diagnosis not present

## 2015-04-30 DIAGNOSIS — I10 Essential (primary) hypertension: Secondary | ICD-10-CM | POA: Diagnosis not present

## 2015-05-01 DIAGNOSIS — E139 Other specified diabetes mellitus without complications: Secondary | ICD-10-CM | POA: Diagnosis not present

## 2015-05-01 DIAGNOSIS — I6330 Cerebral infarction due to thrombosis of unspecified cerebral artery: Secondary | ICD-10-CM | POA: Diagnosis not present

## 2015-05-01 DIAGNOSIS — I10 Essential (primary) hypertension: Secondary | ICD-10-CM | POA: Diagnosis not present

## 2015-05-02 ENCOUNTER — Ambulatory Visit (HOSPITAL_COMMUNITY): Payer: Commercial Managed Care - HMO

## 2015-05-02 ENCOUNTER — Telehealth: Payer: Self-pay | Admitting: Adult Health

## 2015-05-02 ENCOUNTER — Ambulatory Visit (INDEPENDENT_AMBULATORY_CARE_PROVIDER_SITE_OTHER): Payer: Commercial Managed Care - HMO | Admitting: General Practice

## 2015-05-02 ENCOUNTER — Encounter: Payer: Self-pay | Admitting: Endocrinology

## 2015-05-02 DIAGNOSIS — I10 Essential (primary) hypertension: Secondary | ICD-10-CM | POA: Diagnosis not present

## 2015-05-02 DIAGNOSIS — E139 Other specified diabetes mellitus without complications: Secondary | ICD-10-CM | POA: Diagnosis not present

## 2015-05-02 DIAGNOSIS — Z954 Presence of other heart-valve replacement: Secondary | ICD-10-CM | POA: Diagnosis not present

## 2015-05-02 DIAGNOSIS — Z952 Presence of prosthetic heart valve: Secondary | ICD-10-CM

## 2015-05-02 DIAGNOSIS — I6330 Cerebral infarction due to thrombosis of unspecified cerebral artery: Secondary | ICD-10-CM | POA: Diagnosis not present

## 2015-05-02 LAB — POCT INR: INR: 1

## 2015-05-02 NOTE — Progress Notes (Signed)
Pre visit review using our clinic review tool, if applicable. No additional management support is needed unless otherwise documented below in the visit note. 

## 2015-05-02 NOTE — Telephone Encounter (Signed)
Pt would like to request more hours from [facility]. Her aid nicole christopher got on the phone and states pt would benefit from more hours. Pt  Now Gets 17.5 /week.  They would like to see how many hours she qualifies for Pt is there by herself all day long. She need help doing her everyday living things  pls advise

## 2015-05-02 NOTE — Telephone Encounter (Signed)
Pt request refill of the following: solifenacin (VESICARE) 5 MG tablet  Pt said Padonda had changed her medicine  to 1mg  tablet   Pham[facility]: [facility] 

## 2015-05-03 DIAGNOSIS — I10 Essential (primary) hypertension: Secondary | ICD-10-CM | POA: Diagnosis not present

## 2015-05-03 DIAGNOSIS — E139 Other specified diabetes mellitus without complications: Secondary | ICD-10-CM | POA: Diagnosis not present

## 2015-05-03 DIAGNOSIS — I6330 Cerebral infarction due to thrombosis of unspecified cerebral artery: Secondary | ICD-10-CM | POA: Diagnosis not present

## 2015-05-03 MED ORDER — SOLIFENACIN SUCCINATE 5 MG PO TABS: 5.0000 mg | ORAL_TABLET | Freq: Every day | ORAL | Status: DC

## 2015-05-03 NOTE — Telephone Encounter (Signed)
Called the pharmacy and spoke with [facility] and she states pt picked up a 90 day supply of #45 on 5.2.2016.  Pt should be taking the medication 1/2 tablet daily and pt has refills as well. Called and spoke with pt and advised that vesicare comes in 5 mg and 10 mg tablets  Per pt she is taking a whole 5 mg tablet once daily.  Prescription changed and sent to pharmacy.  Ok per [employer] to change rx to vesicare 5 mg tablet daily.

## 2015-05-04 ENCOUNTER — Ambulatory Visit (HOSPITAL_COMMUNITY): Payer: Commercial Managed Care - HMO

## 2015-05-04 ENCOUNTER — Other Ambulatory Visit: Payer: Self-pay | Admitting: Endocrinology

## 2015-05-04 DIAGNOSIS — I6330 Cerebral infarction due to thrombosis of unspecified cerebral artery: Secondary | ICD-10-CM | POA: Diagnosis not present

## 2015-05-04 DIAGNOSIS — E139 Other specified diabetes mellitus without complications: Secondary | ICD-10-CM | POA: Diagnosis not present

## 2015-05-04 DIAGNOSIS — I10 Essential (primary) hypertension: Secondary | ICD-10-CM | POA: Diagnosis not present

## 2015-05-04 NOTE — Telephone Encounter (Signed)
Per [name] pt should contact her insurance company directly to see the maximum amount of hours pt can have [facility] per week.  Attempted to call pt but vm not set up on home phone. Will call at a later time.

## 2015-05-05 DIAGNOSIS — E139 Other specified diabetes mellitus without complications: Secondary | ICD-10-CM | POA: Diagnosis not present

## 2015-05-05 DIAGNOSIS — I6330 Cerebral infarction due to thrombosis of unspecified cerebral artery: Secondary | ICD-10-CM | POA: Diagnosis not present

## 2015-05-05 DIAGNOSIS — I10 Essential (primary) hypertension: Secondary | ICD-10-CM | POA: Diagnosis not present

## 2015-05-06 ENCOUNTER — Ambulatory Visit: Payer: Commercial Managed Care - HMO | Attending: Adult Health | Admitting: *Deleted

## 2015-05-06 ENCOUNTER — Ambulatory Visit: Payer: Commercial Managed Care - HMO

## 2015-05-06 ENCOUNTER — Ambulatory Visit (HOSPITAL_COMMUNITY): Payer: Commercial Managed Care - HMO

## 2015-05-06 DIAGNOSIS — G8194 Hemiplegia, unspecified affecting left nondominant side: Secondary | ICD-10-CM

## 2015-05-06 DIAGNOSIS — R531 Weakness: Secondary | ICD-10-CM | POA: Insufficient documentation

## 2015-05-06 DIAGNOSIS — I6330 Cerebral infarction due to thrombosis of unspecified cerebral artery: Secondary | ICD-10-CM | POA: Diagnosis not present

## 2015-05-06 DIAGNOSIS — E139 Other specified diabetes mellitus without complications: Secondary | ICD-10-CM | POA: Diagnosis not present

## 2015-05-06 DIAGNOSIS — R2689 Other abnormalities of gait and mobility: Secondary | ICD-10-CM

## 2015-05-06 DIAGNOSIS — R269 Unspecified abnormalities of gait and mobility: Secondary | ICD-10-CM

## 2015-05-06 DIAGNOSIS — Z7409 Other reduced mobility: Secondary | ICD-10-CM

## 2015-05-06 DIAGNOSIS — M6289 Other specified disorders of muscle: Secondary | ICD-10-CM | POA: Diagnosis not present

## 2015-05-06 DIAGNOSIS — I10 Essential (primary) hypertension: Secondary | ICD-10-CM | POA: Diagnosis not present

## 2015-05-06 DIAGNOSIS — G8190 Hemiplegia, unspecified affecting unspecified side: Secondary | ICD-10-CM | POA: Diagnosis not present

## 2015-05-06 NOTE — Telephone Encounter (Signed)
Called and spoke with pt and pt is aware.  

## 2015-05-06 NOTE — Therapy (Signed)
[facility] [address] [place], [place], [address] Phone: [phone]   Fax:  [phone]  Occupational Therapy Evaluation  Patient Details  Name: Madison [name] MRN: [id number] Date of Birth: 1961-11-14 Referring Provider:  [name], NP  Encounter Date: 05/06/2015    Past Medical History  Diagnosis Date  . CAD (coronary artery disease)   . Obesity   . Hypercholesteremia   . HTN (hypertension)   . Dyslipidemia   . Aortic stenosis   . Thrombophlebitis   . Heart murmur   . Myocardial infarction 12/2014  . Pneumonia 11/2014; 12/2014  . Diabetes dx'd 1982    "went straight to insulin"  . GERD (gastroesophageal reflux disease)   . 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 Rheumatoid arthritis(714.0)     "hands" (01/19/2015)  . Anxiety   . Depression   . Allergy   . CHF (congestive heart failure)     Past Surgical History  Procedure Laterality Date  . Cesarean section  1995  . Foot fracture surgery    . Knee arthroscopy Right   . Neuroplasty / transposition median nerve at carpal tunnel    . Left heart catheterization with coronary angiogram N/A 12/08/2014    Procedure: LEFT HEART CATHETERIZATION WITH CORONARY ANGIOGRAM;  Surgeon: Iran Ouch, MD;  Location: [facility] CATH LAB;  Service: Cardiovascular;  Laterality: N/A;  . Percutaneous coronary stent intervention (pci-s) N/A 12/09/2014    Procedure: PERCUTANEOUS CORONARY STENT INTERVENTION (PCI-S);  Surgeon: [name], MD;  Location: [facility] CATH LAB;  Service: Cardiovascular;  Laterality: N/A;  . Cardiac catheterization  12/08/2014  . Coronary angioplasty with stent placement  12/09/2014  . Aortic valve replacement (avr)/coronary artery bypass grafting (cabg)  "2014"    [name] 03/08/2014  . Fracture surgery    . Tubal ligation  1995  . Cataract extraction Left     There were no vitals filed  for this visit.  Visit Diagnosis:  Left hemiparesis  Abnormal increased muscle tone  Generalized weakness  Impaired mobility and ADLs      Subjective Assessment - 05/06/15 1021    Subjective  Everyday I do my exercises   Patient Stated Goals Get stronger, able to move better & more. Be able to put a bra on.    Currently in Pain? Yes   Pain Score 9    Pain Location Hand   Pain Orientation Right   Pain Descriptors / Indicators Aching   Pain Type Chronic pain   Pain Onset More than a month ago  "months and months"   Pain Frequency Constant   Pain Relieving Factors Pt reports tramodol helped with pain, but she does not have anymore   Effect of Pain on Daily Activities Poor sleep, only slept 3 hours last night due to pain   Multiple Pain Sites No           [facility] OT Assessment - 05/06/15 0001    Assessment   Diagnosis --  multiple CVAs   Prior Therapy --  Home health PT/OT/SLP, discharged ~2 months ago per pt repor   Precautions   Precautions Fall   Restrictions   Weight Bearing Restrictions No   Balance Screen   Has the patient fallen in the past 6 months No   Has the patient had a decrease in activity level because of a fear of falling?  Yes  Is the patient reluctant to leave their home because of a fear of falling?  No   Home  Environment   Family/patient expects to be discharged to: Private residence   Living Arrangements Alone   Available Help at Discharge Personal care attendant  3pm to 5 pm everyday   Type of Home Aartment   Home Access Stairs  2nd level   Home Layout One level   Bathroom Shower/Tub Tub/Shower unit;Curtain   Ecologist Yes   How accessible Other (Comment)  quad cane   Home Equipment Cane -quad;Bedside commode;Walker - 2 wheels   Lives With Alone   Prior Function   Level of Independence Independent with basic ADLs;Needs assistance with homemaking   Meal Prep Total   Laundry Total   Vacuuming  Total   Light Housekeeping Total   Shopping Moderate   Driving No   Vocation On disability   Leisure going swimming, walking around apartment complex, [place]   ADL   Eating/Feeding Independent   Grooming Independent  some pain in R hand during grooming tasks   Upper Body Bathing Independent   Lower Body Bathing Modified independent   Upper Body Dressing Moderate assistance   Upper Body Dressing Details --  Patient reports difficulty with donning/doffing of bra   Lower Body Dressing Minimal assistance  pt reports difficulty with socks, wants to work on this   Programme researcher, broadcasting/film/video Modified independent  uses rails   Mobility   Mobility Status Needs assist  mod I using quad cane   Written Expression   Dominant Hand Right   Handwriting 100% legible   Vision - History   Baseline Vision Wears glasses all the time   Patient Visual Report --  no change from baseline, pt repors she just went to eye doc   Vision Assessment   Eye Alignment Within Functional Limits   Cognition   Overall Cognitive Status Within Functional Limits for tasks assessed   Sensation   Light Touch Appears Intact   Hot/Cold Impaired by gross assessment   Proprioception Impaired by gross assessment  mores so in hand   Coordination   Gross Motor Movements are Fluid and Coordinated Yes  diminished    Fine Motor Movements are Fluid and Coordinated Yes  diminished    Coordination and Movement Description --  decreased coordination   Box and Blocks 12 blocks in 1 minute   Tone   Assessment Location Left Upper Extremity   ROM / Strength   AROM / PROM / Strength AROM;PROM   AROM   Overall AROM  Deficits   Overall AROM Comments --   AROM Assessment Site Shoulder;Elbow;Forearm;Wrist;Finger   Right/Left Shoulder Left   Left Shoulder Flexion --  0-90*   Left Shoulder Internal Rotation --  decreased   Left  Shoulder External Rotation --  WFL   Right/Left Elbow Left   Left Elbow Flexion --  WFL, difficult due to tone   Left Elbow Extension --  decreased secondary to increased tone   Right/Left Forearm Left   Left Forearm Pronation --  decreased ROM   Left Forearm Supination --  decreased ROM   Right/Left Wrist Left   Left Wrist Extension --  decreased AROM   Left Wrist Flexion --  decreased AROM   Right/Left Finger Left   Left Composite Finger Extension 75%   Left Composite Finger Flexion 75%  PROM   Overall PROM  Due to pain;Unable to assess  unable to fully assess secondary to pain during PROM   Left Hand AROM   L Index  MCP 0-90 --  decreased, but able to extend with extra time   L Index PIP 0-100 --  decreased, but able to extend with extra time   L Index DIP 0-70 --  decreased, but able to extend with extra time   Hand Function   Left Hand Gross Grasp Impaired   Left Hand Grip (lbs) --  23lbs    Sensation Exercises   Stereognosis --  decreased   Functional Reaching Activities   Low Level --  needs assistance   Mid Level --  needs assistance   High Level --  needs assistance    LUE Tone   LUE Tone Moderate         Administered red built up foam to decrease pain and increase independence with self-care tasks.              OT Education - 05/06/15 1111    Education provided No          OT Short Term Goals - 05/06/15 1122    OT SHORT TERM GOAL #1   Title Pt will be modified independent with initial LUE HEP - 06/03/15   Time 4   Period Weeks   Status New   OT SHORT TERM GOAL #2   Title Pt will increase standardized test of bocks and blocks to 18 in 1 minute - 06/03/15   Time 4   Period Weeks   Status New   OT SHORT TERM GOAL #3   Title Pt will increase grip strenth in left hand to 26 lbs - 06/03/15   Time 4   Period Weeks   Status New   OT SHORT TERM GOAL #4   Title Pt will be educated on UB dressing technique for donning/doffing of bra  and will increase independence for this task > min assist - 06/03/15   Time 4   Period Weeks   Status New   OT SHORT TERM GOAL #5   Title AROM > LUE shoulder will increase to 100* of functional reach during functional task - 06/03/15   Time 4   Period Weeks   Status New   Additional Short Term Goals   Additional Short Term Goals Yes   OT SHORT TERM GOAL #6   Title Pt will be independent with splint wearing schedule and skin care/checks - 06/03/15   Time 4   Period Weeks   Status New           OT Long Term Goals - 05/06/15 1129    OT LONG TERM GOAL #1   Title Pt will be mod I with updated LUE HEP - 07/01/15   Time 8   Period Weeks   Status New   OT LONG TERM GOAL #2   Title Pt will increase standardized bock and blocks to 25 blocks in 1 minute - 07/01/15   Time 8   Period Weeks   Status New   OT LONG TERM GOAL #3   Title Pt will increase grip strength in left hand > 30lbs - 07/01/15   Time 8   Period Weeks   Status New   OT LONG TERM GOAL #4   Title Pt will be independent with donning/doffing of bra - 07/01/15   Time 8   Period Weeks   Status  New   OT LONG TERM GOAL #5   Title Patient will be mod I with IADL cooking tasks in standing, using quad cane - 07/01/15   Time 8   Period Weeks   Status New   Long Term Additional Goals   Additional Long Term Goals Yes   OT LONG TERM GOAL #6   Title AROM of shoulder will increase to 110* of functional reach during functional task - 07/01/15   Time 8   Period Weeks   Status New               Plan - 05/06/15 1112    Clinical Impression Statement 54 yo female with h/o multiple CVAs effecting left side. Pt with aphasia and reports getting home health therapies until about 2 months ago. No vision changes or problems. Patient's main complaints are functional use of LUE and decreased independence with some ADL and IADL tasks. Patient presents to OP neuro with decreased functional use of LUE, decreased A/PROM > LUE, increased  pain in right hand, increased pain in left shoulder during movement, decreased independence in the areas of some ADLs and IALDs, decreased activity tolerance/endurance.    Pt will benefit from skilled therapeutic intervention in order to improve on the following deficits (Retired) Decreased activity tolerance;Decreased balance;Decreased coordination;Decreased endurance;Decreased mobility;Decreased range of motion;Decreased safety awareness;Decreased strength;Impaired sensation;Impaired tone;Impaired UE functional use;Pain   Rehab Potential Good   Clinical Impairments Affecting Rehab Potential None known at this time   OT Frequency 2x / week   OT Duration 8 weeks   OT Treatment/Interventions Self-care/ADL training;Electrical Stimulation;Fluidtherapy;Therapeutic exercise;Neuromuscular education;Energy conservation;DME and/or AE instruction;Functional Mobility Training;Manual Therapy;Passive range of motion;Splinting;Therapeutic exercises;Therapeutic activities;Patient/family education;Balance training   Plan functional reach, NMR > LUE, weight bearing, pain management, splinting > Lhand    OT Home Exercise Plan on going   Recommended Other Services None at this time   Consulted and Agree with Plan of Care Patient        Problem List Patient Active Problem List   Diagnosis Date Noted  . Normocytic anemia 01/31/2015  . Arterial hypotension   . Other specified fever   . Diabetic ketoacidosis with coma associated with other specified diabetes mellitus   . Fever   . Suspected Sepsis 01/01/2015  . Acute on chronic combined systolic and diastolic CHF (congestive heart failure)   . Essential hypertension   . Acute kidney injury   . Elevated troponin   . Diabetes type 1, uncontrolled   . Demand ischemia   . Acute on chronic systolic CHF (congestive heart failure)   . S/P aortic valve replacement   . Hypokalemia   . Acute renal failure syndrome   . NSTEMI (non-ST elevated myocardial  infarction) 12/08/2014  . Blood poisoning   . Supratherapeutic INR   . Severe sepsis with acute organ dysfunction 12/03/2014  . Lactic acidosis   . Spastic hemiplegia affecting nondominant side 11/29/2014  . Dysphagia, pharyngoesophageal phase 10/04/2014  . Sinus tachycardia 09/30/2014  . Diabetic ketoacidosis without coma associated with type 1 diabetes mellitus   . Depression 04/29/2014  . AKI (acute kidney injury) 03/12/2014  . CVA (cerebral infarction) 03/12/2014  . Acute respiratory failure 03/12/2014  . DKA (diabetic ketoacidoses) 03/08/2014  . Severe sepsis(995.92) 03/08/2014  . Metabolic acidosis 03/08/2014  . 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 Chronic anticoagulation 07/28/2013  . S/P AVR 07/28/2013  . Long term (current) use of anticoagulants 07/28/2013  . CAD (coronary artery disease)   .  Obesity   . HTN (hypertension)   . Aortic stenosis   . Thrombophlebitis   . Hyperlipidemia   . Rheumatoid arthritis     Tarri Guilfoil , MS, OTR/L, CLT Pager: [phone]  05/06/2015, 11:47 AM  [facility] [address] [place], [place], [address] Phone: [phone]   Fax:  [phone]

## 2015-05-06 NOTE — Therapy (Signed)
[facility] [address] [place], [place], [address] Phone: [phone]   Fax:  [phone]  Physical Therapy Evaluation  Patient Details  Name: Madison [name] MRN: [id number] Date of Birth: [birth year] Referring Provider:  [name], NP  Encounter Date: 05/06/2015      PT End of Session - 05/06/15 1259    Visit Number 1   Number of Visits 9   Date for PT Re-Evaluation 06/05/15   Authorization Type [employer] (G-code every 10th visit). Medicaid secondary per pt.   PT Start Time 1102   PT Stop Time 1136  session ended early as pt's transportation was going to leave pt.   PT Time Calculation (min) 34 min   Activity Tolerance Patient tolerated treatment well   Behavior During Therapy WFL for tasks assessed/performed      Past Medical History  Diagnosis Date  . CAD (coronary artery disease)   . Obesity   . Hypercholesteremia   . HTN (hypertension)   . Dyslipidemia   . Aortic stenosis   . Thrombophlebitis   . Heart murmur   . Myocardial infarction 12/2014  . Pneumonia 11/2014; 12/2014  . Diabetes dx'd 1982    "went straight to insulin"  . GERD (gastroesophageal reflux disease)   . 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 Rheumatoid arthritis(714.0)     "hands" (01/19/2015)  . Anxiety   . Depression   . Allergy   . CHF (congestive heart failure)     Past Surgical History  Procedure Laterality Date  . Cesarean section  1995  . Foot fracture surgery    . Knee arthroscopy Right   . Neuroplasty / transposition median nerve at carpal tunnel    . Left heart catheterization with coronary angiogram N/A 12/08/2014    Procedure: LEFT HEART CATHETERIZATION WITH CORONARY ANGIOGRAM;  Surgeon: Iran Ouch, MD;  [facility]: [facility] C[facility];  Service: Cardiovascular;  Laterality: N/A;  . Percutaneous coronary stent intervention (pci-s) N/A 12/09/2014     Procedure: PERCUTANEOUS CORONARY STENT INTERVENTION (PCI-S);  Surgeon: [name], MD;  Location: [facility] CATH LAB;  Service: Cardiovascular;  Laterality: N/A;  . Cardiac catheterization  12/08/2014  . Coronary angioplasty with stent placement  12/09/2014  . Aortic valve replacement (avr)/coronary artery bypass grafting (cabg)  "2014"    [name] 03/08/2014  . Fracture surgery    . Tubal ligation  1995  . Cataract extraction Left     There were no vitals filed for this visit.  Visit Diagnosis:  Abnormality of gait - Plan: PT plan of care cert/re-cert  Left hemiparesis - Plan: PT plan of care cert/re-cert  Impairment of balance - Plan: PT plan of care cert/re-cert      Subjective Assessment - 05/06/15 1109    Subjective Pt reported she has experienced 5 strokes, with the most recent CVA being in 04/2014. Pt reported she had HHPT after the last CVA but no OPPT. Pt reported she was walking better after HHPT but has experienced a decline in balance and difficulty walking over the last few months (March or April of 2016).   Pertinent History hx of 5 CVA's per pt, DM, MI, HTN, RA, aortic stenosis   Patient Stated Goals Move better and get stronger   Currently in Pain? Yes   Pain Score 9    Pain Location Hand   Pain Orientation Right  Pain Descriptors / Indicators Aching   Pain Type Chronic pain   Pain Onset More than a month ago   Pain Frequency Constant   Aggravating Factors  none   Pain Relieving Factors medication, but pt reported she no longer has medication as pt's niece stole her pills            [facility] PT Assessment - 05/06/15 1114    Assessment   Medical Diagnosis Spastic hemiplegia   Onset Date/Surgical Date 02/18/15  last CVA in 2015 but balance began to worsen in March/April    Hand Dominance Right   Prior Therapy HHPT in 04/2014   Precautions   Precautions Fall   Precaution Comments based on BERG score   Restrictions   Weight Bearing Restrictions No   Balance  Screen   Has the patient fallen in the past 6 months No   Has the patient had a decrease in activity level because of a fear of falling?  Yes   Is the patient reluctant to leave their home because of a fear of falling?  No   Home Nurse, mental health Private residence   Living Arrangements Alone   Available Help at Discharge Other (Comment)  pt's son (59 years old) might come live with pt   Type of Home Apartment   Home Access Stairs to enter   Entrance Stairs-Number of Steps 10   Entrance Stairs-Rails Right   Home Layout Two level   Alternate Level Stairs-Number of Steps 12   Alternate Level Stairs-Rails Right   Home Equipment Cane - quad;Bedside commode;Walker - 4 wheels;Grab bars - tub/shower   Additional Comments pt has an aide who assists pt every day 3pm-5:30 or 6pm. She assists pt with laundry, vacuums, fixes food.   Prior Function   Level of Independence Needs assistance with ADLs;Needs assistance with homemaking;Requires assistive device for independence   Vocation On disability   Leisure going swimming, walking around apartment complex, yard sales   Cognition   Overall Cognitive Status Within Functional Limits for tasks assessed   Sensation   Additional Comments Pt reported R hand/foot N/T, intermittent.   Coordination   Gross Motor Movements are Fluid and Coordinated Not tested   Fine Motor Movements are Fluid and Coordinated --  due to time constraints   Posture/Postural Control   Posture/Postural Control Postural limitations   Postural Limitations Forward head   Tone   Assessment Location Other (comment)  unable to test due to time contraints   ROM / Strength   AROM / PROM / Strength AROM;Strength   AROM   Overall AROM  Deficits   Overall AROM Comments Decreased L UE ROM-OT is addressing. Decreased L DF, pt able to obtain approx. 80 degress DF.   Strength   Overall Strength Deficits   Overall Strength Comments R LE WFL. L LE globally 4/5, except for  3+/5 D knee flexion and 2/5 DF.   Transfers   Transfers Sit to Stand;Stand to Sit   Sit to Stand 5: Supervision;Without upper extremity assist;From chair/3-in-1   Stand to Sit 5: Supervision;Without upper extremity assist;To chair/3-in-1   Ambulation/Gait   Ambulation/Gait Yes   Ambulation/Gait Assistance 5: Supervision   Ambulation/Gait Assistance Details Pt ambulated over even terrain. Pt did not use SBQC during assessment, but did bring it to session, as she uses it for community amb.   Ambulation Distance (Feet) 100 Feet   Assistive device None   Gait Pattern Decreased stride length;Step-through pattern;Decreased dorsiflexion -  left;Decreased hip/knee flexion - left;Decreased trunk rotation   Ambulation Surface Level;Indoor   Gait velocity 2.50ft/sec.  without AD   Standardized Balance Assessment   Standardized Balance Assessment Berg Balance Test;Timed Up and Go Test   Berg Balance Test   Sit to Stand Able to stand without using hands and stabilize independently   Standing Unsupported Able to stand safely 2 minutes   Sitting with Back Unsupported but Feet Supported on Floor or Stool Able to sit safely and securely 2 minutes   Stand to Sit Sits safely with minimal use of hands   Transfers Able to transfer safely, minor use of hands   Standing Unsupported with Eyes Closed Able to stand 10 seconds safely   Standing Ubsupported with Feet Together Able to place feet together independently and stand 1 minute safely   From Standing, Reach Forward with Outstretched Arm Can reach forward >12 cm safely (5")   From Standing Position, Pick up Object from Floor Able to pick up shoe, needs supervision   From Standing Position, Turn to Look Behind Over each Shoulder Looks behind one side only/other side shows less weight shift   Turn 360 Degrees Able to turn 360 degrees safely but slowly   Standing Unsupported, Alternately Place Feet on Step/Stool Able to complete >2 steps/needs minimal assist    Standing Unsupported, One Foot in Front Able to take small step independently and hold 30 seconds   Standing on One Leg Tries to lift leg/unable to hold 3 seconds but remains standing independently   Total Score 43   Timed Up and Go Test   TUG Normal TUG   Normal TUG (seconds) 9.36  no AD                           PT Education - 05/06/15 1259    Education provided Yes   Education Details PT educated pt on frequency/duration and outcome measure results.   Person(s) Educated Patient   Methods Explanation   Comprehension Verbalized understanding          PT Short Term Goals - 05/06/15 1303    PT SHORT TERM GOAL #1   Title same as LTGs.           PT Long Term Goals - 05/06/15 1304    PT LONG TERM GOAL #1   Title Pt will be independent in HEP to improve strength, endurance, and safety during functional mobility. Target date: 06/03/15.   Status New   PT LONG TERM GOAL #2   Title Pt will improve BERG score to >/=47/56 to decrease falls risk. Target date: 06/03/15.   Status New   PT LONG TERM GOAL #3   Title Pt will ambulate 500' over even/uneven terrain with LRAD at MOD I level to improve functional mobilty. Target date: 06/03/15.   Status New   PT LONG TERM GOAL #4   Title Pt will traverse 12 steps with one handrail at MOD I level in order to traverse stairs at home. Target date: 06/03/15.   Status New   PT LONG TERM GOAL #5   Title Pt will verbalize understanding of CVA risk factors and signs/symptoms to reduce risk of future CVA. Target date: 06/03/15.   Status New   Additional Long Term Goals   Additional Long Term Goals Yes   PT LONG TERM GOAL #6   Title Pt will verbalize plans to join fitness center in order to maintain gains made  in PT. Target date: 06/03/15.   Status New               Plan - 05/23/2015 1300    Clinical Impression Statement Pt is a pleasant 54y/o female presenting to OPPT neuro with history of 5 CVA's, with most recent CVA  22-May-2014. Pt presented with impaired balance, decreased L LE strength, and decreased endurance. Pt's BERG score indicate she is at risk for falls.  PT did not have time to assess coordination or tone, as session had to be ceased as her transportation was going to leave her at PT. MD referral states pt's diagnosis is spastic hemiplegia, so it is assumed she has increased L sided tone. PT will assess next visit.    Pt will benefit from skilled therapeutic intervention in order to improve on the following deficits Abnormal gait;Impaired tone;Decreased endurance;Impaired UE functional use;Pain;Impaired flexibility;Decreased strength;Decreased mobility;Decreased balance;Decreased knowledge of use of DME  pt will monitor pain but will not address directly.   Rehab Potential Good   PT Frequency 2x / week   PT Duration 4 weeks   PT Treatment/Interventions ADLs/Self Care Home Management;Balance training;Neuromuscular re-education;DME Instruction;Therapeutic exercise;Manual techniques;Therapeutic activities;Functional mobility training;Orthotic Fit/Training;Stair training;Gait training;Patient/family education   PT Next Visit Plan Assess tone and coordination. Initiate balance/strength HEP.   Consulted and Agree with Plan of Care Patient          G-Codes - 2015-05-23 1307    Functional Assessment Tool Used BERG: 43/56; TUG no AD: 9.36 sec.; gait speed: 2.21ft/sec. no AD   Functional Limitation Mobility: Walking and moving around   Mobility: Walking and Moving Around Current Status [phone]) At least 20 percent but less than 40 percent impaired, limited or restricted   Mobility: Walking and Moving Around Goal Status [phone]) At least 1 percent but less than 20 percent impaired, limited or restricted       Problem List Patient Active Problem List   Diagnosis Date Noted  . Normocytic anemia 01/31/2015  . Arterial hypotension   . Other specified fever   . Diabetic ketoacidosis with coma associated with other  specified diabetes mellitus   . Fever   . Suspected Sepsis 01/01/2015  . Acute on chronic combined systolic and diastolic CHF (congestive heart failure)   . Essential hypertension   . Acute kidney injury   . Elevated troponin   . Diabetes type 1, uncontrolled   . Demand ischemia   . Acute on chronic systolic CHF (congestive heart failure)   . S/P aortic valve replacement   . Hypokalemia   . Acute renal failure syndrome   . NSTEMI (non-ST elevated myocardial infarction) 12/08/2014  . Blood poisoning   . Supratherapeutic INR   . Severe sepsis with acute organ dysfunction 12/03/2014  . Lactic acidosis   . Spastic hemiplegia affecting nondominant side 11/29/2014  . Dysphagia, pharyngoesophageal phase 10/04/2014  . Sinus tachycardia 09/30/2014  . Diabetic ketoacidosis without coma associated with type 1 diabetes mellitus   . Depression 04/29/2014  . AKI (acute kidney injury) 03/12/2014  . CVA (cerebral infarction) 03/12/2014  . Acute respiratory failure 03/12/2014  . DKA (diabetic ketoacidoses) 03/08/2014  . Severe sepsis(995.92) 03/08/2014  . Metabolic acidosis 03/08/2014  . 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 Chronic anticoagulation 07/28/2013  . S/P AVR 07/28/2013  . Long term (current) use of anticoagulants 07/28/2013  . CAD (coronary artery disease)   . Obesity   . HTN (hypertension)   . Aortic stenosis   .  Thrombophlebitis   . Hyperlipidemia   . Rheumatoid arthritis     Rebeka Kimble L 05/06/2015, 1:10 PM  [facility] [address] [place], [place], [address] Phone: [phone]   Fax:  [phone]     Zerita Boers, PT,DPT 05/06/2015 1:10 PM Phone: [phone] Fax: [phone]

## 2015-05-07 DIAGNOSIS — I6330 Cerebral infarction due to thrombosis of unspecified cerebral artery: Secondary | ICD-10-CM | POA: Diagnosis not present

## 2015-05-07 DIAGNOSIS — I10 Essential (primary) hypertension: Secondary | ICD-10-CM | POA: Diagnosis not present

## 2015-05-07 DIAGNOSIS — E139 Other specified diabetes mellitus without complications: Secondary | ICD-10-CM | POA: Diagnosis not present

## 2015-05-08 DIAGNOSIS — I10 Essential (primary) hypertension: Secondary | ICD-10-CM | POA: Diagnosis not present

## 2015-05-08 DIAGNOSIS — I6330 Cerebral infarction due to thrombosis of unspecified cerebral artery: Secondary | ICD-10-CM | POA: Diagnosis not present

## 2015-05-08 DIAGNOSIS — E139 Other specified diabetes mellitus without complications: Secondary | ICD-10-CM | POA: Diagnosis not present

## 2015-05-09 ENCOUNTER — Ambulatory Visit (INDEPENDENT_AMBULATORY_CARE_PROVIDER_SITE_OTHER): Payer: Commercial Managed Care - HMO | Admitting: General Practice

## 2015-05-09 ENCOUNTER — Telehealth: Payer: Self-pay | Admitting: Adult Health

## 2015-05-09 ENCOUNTER — Encounter: Payer: Self-pay | Admitting: *Deleted

## 2015-05-09 ENCOUNTER — Telehealth: Payer: Self-pay | Admitting: Cardiovascular Disease

## 2015-05-09 ENCOUNTER — Ambulatory Visit (HOSPITAL_COMMUNITY): Payer: Commercial Managed Care - HMO

## 2015-05-09 ENCOUNTER — Other Ambulatory Visit: Payer: Self-pay | Admitting: Internal Medicine

## 2015-05-09 DIAGNOSIS — I10 Essential (primary) hypertension: Secondary | ICD-10-CM | POA: Diagnosis not present

## 2015-05-09 DIAGNOSIS — I6330 Cerebral infarction due to thrombosis of unspecified cerebral artery: Secondary | ICD-10-CM | POA: Diagnosis not present

## 2015-05-09 DIAGNOSIS — Z954 Presence of other heart-valve replacement: Secondary | ICD-10-CM

## 2015-05-09 DIAGNOSIS — Z952 Presence of prosthetic heart valve: Secondary | ICD-10-CM

## 2015-05-09 DIAGNOSIS — E139 Other specified diabetes mellitus without complications: Secondary | ICD-10-CM | POA: Diagnosis not present

## 2015-05-09 LAB — POCT INR: INR: 4.4

## 2015-05-09 NOTE — Telephone Encounter (Signed)
Pt needs a letter stating he health is good and she has not been hospitalized recently and also that she has been keeping her doctors appt. Fax to [employer] [phone]

## 2015-05-09 NOTE — Telephone Encounter (Signed)
Called and spoke with pt and pt states she is social services in [place] but they have her son now.  Pt states she was in the hospital and not doing well but now she has had improvements.  Pt's son wants to come back home because he is in [facility]. Pt needs the letter stating she has not fallen or been in the hospital recently.  Advised pt that [name] would have to decide if this letter could be written. Pt states if [name] needs to talk with her she will be in the office for a coumadin check at 2 pm.

## 2015-05-09 NOTE — Telephone Encounter (Signed)
SEE LETTERS  NOTE  FAXED   TO  [phone] AT PT'S REQUEST .Zack Seal

## 2015-05-09 NOTE — Telephone Encounter (Signed)
Have her make an appointment to see me

## 2015-05-09 NOTE — Telephone Encounter (Signed)
Called and spoke with pt and pt is aware of appt on 6.24.2016 at 2:00 pm.

## 2015-05-09 NOTE — Telephone Encounter (Signed)
Attempted to call pt; line busy.  Will call at a later time.

## 2015-05-09 NOTE — Progress Notes (Signed)
Pre visit review using our clinic review tool, if applicable. No additional management support is needed unless otherwise documented below in the visit note. 

## 2015-05-09 NOTE — Telephone Encounter (Signed)
New Message  Pt calling to speak w/ RN about letter- indicating pt's condition and that she is not due until 03/2016. Please call back and discuss.

## 2015-05-10 DIAGNOSIS — I10 Essential (primary) hypertension: Secondary | ICD-10-CM | POA: Diagnosis not present

## 2015-05-10 DIAGNOSIS — I6330 Cerebral infarction due to thrombosis of unspecified cerebral artery: Secondary | ICD-10-CM | POA: Diagnosis not present

## 2015-05-10 DIAGNOSIS — E139 Other specified diabetes mellitus without complications: Secondary | ICD-10-CM | POA: Diagnosis not present

## 2015-05-11 ENCOUNTER — Ambulatory Visit (HOSPITAL_COMMUNITY): Payer: Commercial Managed Care - HMO

## 2015-05-11 DIAGNOSIS — E139 Other specified diabetes mellitus without complications: Secondary | ICD-10-CM | POA: Diagnosis not present

## 2015-05-11 DIAGNOSIS — I6330 Cerebral infarction due to thrombosis of unspecified cerebral artery: Secondary | ICD-10-CM | POA: Diagnosis not present

## 2015-05-11 DIAGNOSIS — I10 Essential (primary) hypertension: Secondary | ICD-10-CM | POA: Diagnosis not present

## 2015-05-11 NOTE — Therapy (Signed)
[facility] [address] [place], [place], [address] Phone: [phone]   Fax:  [phone]  Occupational Therapy Evaluation  Patient Details  Name: Madison [name] MRN: [id number] Date of Birth: 1961/07/10 Referring Provider:  [name], NP  Encounter Date: 05/06/2015    Past Medical History  Diagnosis Date  . CAD (coronary artery disease)   . Obesity   . Hypercholesteremia   . HTN (hypertension)   . Dyslipidemia   . Aortic stenosis   . Thrombophlebitis   . Heart murmur   . Myocardial infarction 12/2014  . Pneumonia 11/2014; 12/2014  . Diabetes dx'd 1982    "went straight to insulin"  . GERD (gastroesophageal reflux disease)   . 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 Rheumatoid arthritis(714.0)     "hands" (01/19/2015)  . Anxiety   . Depression   . Allergy   . CHF (congestive heart failure)     Past Surgical History  Procedure Laterality Date  . Cesarean section  1995  . Foot fracture surgery    . Knee arthroscopy Right   . Neuroplasty / transposition median nerve at carpal tunnel    . Left heart catheterization with coronary angiogram N/A 12/08/2014    Procedure: LEFT HEART CATHETERIZATION WITH CORONARY ANGIOGRAM;  Surgeon: Iran Ouch, MD;  Location: [facility] CATH LAB;  Service: Cardiovascular;  Laterality: N/A;  . Percutaneous coronary stent intervention (pci-s) N/A 12/09/2014    Procedure: PERCUTANEOUS CORONARY STENT INTERVENTION (PCI-S);  Surgeon: [name], MD;  Location: [facility] CATH LAB;  Service: Cardiovascular;  Laterality: N/A;  . Cardiac catheterization  12/08/2014  . Coronary angioplasty with stent placement  12/09/2014  . Aortic valve replacement (avr)/coronary artery bypass grafting (cabg)  "2014"    [name] 03/08/2014  . Fracture surgery    . Tubal ligation  1995  . Cataract extraction Left     There were no vitals filed  for this visit.  Visit Diagnosis:  Left hemiparesis - Plan: Ot plan of care cert/re-cert  Abnormal increased muscle tone - Plan: Ot plan of care cert/re-cert  Generalized weakness - Plan: Ot plan of care cert/re-cert  Impaired mobility and ADLs - Plan: Ot plan of care cert/re-cert                            OT Short Term Goals - 05/11/15 1233    OT SHORT TERM GOAL #1   Title Pt will be modified independent with initial LUE HEP - 06/03/15   Time 4   Period Weeks   Status New   OT SHORT TERM GOAL #2   Title Pt will increase standardized test of box and blocks to 18 in 1 minute - 06/03/15   Baseline 12 blocks   Time 4   Period Weeks   Status New   OT SHORT TERM GOAL #3   Title Pt will increase grip strenth in left hand to 26 lbs - 06/03/15   Baseline 23 lbs   Time 4   Period Weeks   Status New   OT SHORT TERM GOAL #4   Title Pt will be educated on UB dressing technique for donning/doffing of bra and will increase independence for this task > min assist - 06/03/15   Baseline mod assist   Time 4   Period Weeks   Status New  OT SHORT TERM GOAL #5   Title AROM > LUE shoulder will increase to 100* of functional reach during functional task - 06/03/15   Baseline 90*   Time 4   Period Weeks   Status New   OT SHORT TERM GOAL #6   Title Pt will be independent with splint wearing schedule and skin care/checks - 06/03/15   Time 4   Period Weeks   Status New           OT Long Term Goals - 05/11/15 1233    OT LONG TERM GOAL #1   Title Pt will be mod I with updated LUE HEP - 07/01/15   Time 8   Period Weeks   Status New   OT LONG TERM GOAL #2   Title Pt will increase standardized box and blocks to 25 blocks in 1 minute - 07/01/15   Baseline 12 blocks   Time 8   Period Weeks   Status New   OT LONG TERM GOAL #3   Title Pt will increase grip strength in left hand > 30lbs - 07/01/15   Baseline 23 lbs   Time 8   Period Weeks   Status New   OT LONG TERM  GOAL #4   Title Pt will be independent with donning/doffing of bra - 07/01/15   Baseline mod assist   Time 8   Period Weeks   Status New   OT LONG TERM GOAL #5   Title Patient will be mod I with IADL cooking tasks in standing, using quad cane - 07/01/15   Baseline supervision   Time 8   Period Weeks   Status New   OT LONG TERM GOAL #6   Title AROM of shoulder will increase to 110* of functional reach during functional task - 07/01/15   Baseline 90*   Time 8   Period Weeks   Status New               Problem List Patient Active Problem List   Diagnosis Date Noted  . Normocytic anemia 01/31/2015  . Arterial hypotension   . Other specified fever   . Diabetic ketoacidosis with coma associated with other specified diabetes mellitus   . Fever   . Suspected Sepsis 01/01/2015  . Acute on chronic combined systolic and diastolic CHF (congestive heart failure)   . Essential hypertension   . Acute kidney injury   . Elevated troponin   . Diabetes type 1, uncontrolled   . Demand ischemia   . Acute on chronic systolic CHF (congestive heart failure)   . S/P aortic valve replacement   . Hypokalemia   . Acute renal failure syndrome   . NSTEMI (non-ST elevated myocardial infarction) 12/08/2014  . Blood poisoning   . Supratherapeutic INR   . Severe sepsis with acute organ dysfunction 12/03/2014  . Lactic acidosis   . Spastic hemiplegia affecting nondominant side 11/29/2014  . Dysphagia, pharyngoesophageal phase 10/04/2014  . Sinus tachycardia 09/30/2014  . Diabetic ketoacidosis without coma associated with type 1 diabetes mellitus   . Depression 04/29/2014  . AKI (acute kidney injury) 03/12/2014  . CVA (cerebral infarction) 03/12/2014  . Acute respiratory failure 03/12/2014  . DKA (diabetic ketoacidoses) 03/08/2014  . Severe sepsis(995.92) 03/08/2014  . Metabolic acidosis 03/08/2014  . 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 Chronic  anticoagulation 07/28/2013  . S/P AVR 07/28/2013  . Long term (current) use of anticoagulants 07/28/2013  . CAD (coronary artery  disease)   . Obesity   . HTN (hypertension)   . Aortic stenosis   . Thrombophlebitis   . Hyperlipidemia   . Rheumatoid arthritis     Edwin Cap , OT  05/11/2015, 12:34 PM  [facility] [address] [place], [place], [address] Phone: [phone]   Fax:  [phone]

## 2015-05-12 DIAGNOSIS — I6330 Cerebral infarction due to thrombosis of unspecified cerebral artery: Secondary | ICD-10-CM | POA: Diagnosis not present

## 2015-05-12 DIAGNOSIS — E139 Other specified diabetes mellitus without complications: Secondary | ICD-10-CM | POA: Diagnosis not present

## 2015-05-12 DIAGNOSIS — I10 Essential (primary) hypertension: Secondary | ICD-10-CM | POA: Diagnosis not present

## 2015-05-13 ENCOUNTER — Ambulatory Visit (HOSPITAL_COMMUNITY): Payer: Commercial Managed Care - HMO

## 2015-05-13 ENCOUNTER — Encounter: Payer: Self-pay | Admitting: Adult Health

## 2015-05-13 ENCOUNTER — Ambulatory Visit (INDEPENDENT_AMBULATORY_CARE_PROVIDER_SITE_OTHER): Payer: Commercial Managed Care - HMO | Admitting: Adult Health

## 2015-05-13 VITALS — BP 124/86 | Temp 98.7°F | Ht 63.0 in | Wt 160.6 lb

## 2015-05-13 DIAGNOSIS — I6330 Cerebral infarction due to thrombosis of unspecified cerebral artery: Secondary | ICD-10-CM | POA: Diagnosis not present

## 2015-05-13 DIAGNOSIS — Z7189 Other specified counseling: Principal | ICD-10-CM

## 2015-05-13 DIAGNOSIS — Z7689 Persons encountering health services in other specified circumstances: Secondary | ICD-10-CM

## 2015-05-13 DIAGNOSIS — Z62820 Parent-biological child conflict: Secondary | ICD-10-CM

## 2015-05-13 DIAGNOSIS — E139 Other specified diabetes mellitus without complications: Secondary | ICD-10-CM | POA: Diagnosis not present

## 2015-05-13 DIAGNOSIS — I10 Essential (primary) hypertension: Secondary | ICD-10-CM | POA: Diagnosis not present

## 2015-05-13 NOTE — Progress Notes (Signed)
Patient ID: Madison [name], female   DOB: 31-Oct-1961, 54 y.o.   MRN: [id number]   Ms. Marcantonio presented to the office today to ask me to write her a letter stating that she was healthy and able to take care of her 14 year old son. She needs this due to the fact that her son is in a fostor home in [place] after his father gave him up. Emila endorses that she took care of her son for the first 8 years of his life before his father took over care.   We had a discussion on whether she felt like she could take care of a 54 year old in her current state of health as she has a aid coming into the house 17 hours a week to help. She advised me that she could take care of her child.

## 2015-05-13 NOTE — Progress Notes (Signed)
Pre visit review using our clinic review tool, if applicable. No additional management support is needed unless otherwise documented below in the visit note. 

## 2015-05-14 ENCOUNTER — Other Ambulatory Visit: Payer: Self-pay | Admitting: Internal Medicine

## 2015-05-14 DIAGNOSIS — I10 Essential (primary) hypertension: Secondary | ICD-10-CM | POA: Diagnosis not present

## 2015-05-14 DIAGNOSIS — I6330 Cerebral infarction due to thrombosis of unspecified cerebral artery: Secondary | ICD-10-CM | POA: Diagnosis not present

## 2015-05-14 DIAGNOSIS — E139 Other specified diabetes mellitus without complications: Secondary | ICD-10-CM | POA: Diagnosis not present

## 2015-05-15 DIAGNOSIS — E139 Other specified diabetes mellitus without complications: Secondary | ICD-10-CM | POA: Diagnosis not present

## 2015-05-15 DIAGNOSIS — I6330 Cerebral infarction due to thrombosis of unspecified cerebral artery: Secondary | ICD-10-CM | POA: Diagnosis not present

## 2015-05-15 DIAGNOSIS — I10 Essential (primary) hypertension: Secondary | ICD-10-CM | POA: Diagnosis not present

## 2015-05-16 ENCOUNTER — Ambulatory Visit (HOSPITAL_COMMUNITY): Payer: Commercial Managed Care - HMO

## 2015-05-16 ENCOUNTER — Telehealth: Payer: Self-pay

## 2015-05-16 DIAGNOSIS — I10 Essential (primary) hypertension: Secondary | ICD-10-CM | POA: Diagnosis not present

## 2015-05-16 DIAGNOSIS — E139 Other specified diabetes mellitus without complications: Secondary | ICD-10-CM | POA: Diagnosis not present

## 2015-05-16 DIAGNOSIS — I6330 Cerebral infarction due to thrombosis of unspecified cerebral artery: Secondary | ICD-10-CM | POA: Diagnosis not present

## 2015-05-16 NOTE — Telephone Encounter (Signed)
I contacted the pt and advised of note below. Pt is now requesting a letter stating to following. 1. She has not been admitted to the hospital 2. EMS has not been called to her house  3. She is keeping her appointments and checking her blood sugars appropriately Please advise if this letter can be done  Thanks!

## 2015-05-16 NOTE — Telephone Encounter (Signed)
I contacted the pt advised her letter has been completed and faxed to Tamala Bari  Fax Number # [phone]

## 2015-05-16 NOTE — Telephone Encounter (Signed)
As of last ov, a1c was higher, so i can't make these statements.  However, i hope your blood sugar is doing better since last ov

## 2015-05-16 NOTE — Telephone Encounter (Signed)
i printed 

## 2015-05-16 NOTE — Telephone Encounter (Signed)
Pt called requesting a letter. Pt stated the letter needs to states the following: 1. She is compliant with her appointments and her medications 2. Her blood sugars are doing well and are within rage 3. She is doing well from a medical stand point.  Please advise, Thanks!

## 2015-05-17 ENCOUNTER — Telehealth: Payer: Self-pay | Admitting: Adult Health

## 2015-05-17 ENCOUNTER — Encounter: Payer: Self-pay | Admitting: Adult Health

## 2015-05-17 DIAGNOSIS — I10 Essential (primary) hypertension: Secondary | ICD-10-CM | POA: Diagnosis not present

## 2015-05-17 DIAGNOSIS — I6330 Cerebral infarction due to thrombosis of unspecified cerebral artery: Secondary | ICD-10-CM | POA: Diagnosis not present

## 2015-05-17 DIAGNOSIS — E139 Other specified diabetes mellitus without complications: Secondary | ICD-10-CM | POA: Diagnosis not present

## 2015-05-17 NOTE — Telephone Encounter (Signed)
Letter faxed to Autumn Patty at [facility] [employer]

## 2015-05-18 ENCOUNTER — Ambulatory Visit (HOSPITAL_COMMUNITY): Payer: Commercial Managed Care - HMO

## 2015-05-18 DIAGNOSIS — I6330 Cerebral infarction due to thrombosis of unspecified cerebral artery: Secondary | ICD-10-CM | POA: Diagnosis not present

## 2015-05-18 DIAGNOSIS — Z79899 Other long term (current) drug therapy: Secondary | ICD-10-CM | POA: Diagnosis not present

## 2015-05-18 DIAGNOSIS — E139 Other specified diabetes mellitus without complications: Secondary | ICD-10-CM | POA: Diagnosis not present

## 2015-05-18 DIAGNOSIS — I10 Essential (primary) hypertension: Secondary | ICD-10-CM | POA: Diagnosis not present

## 2015-05-18 DIAGNOSIS — Z5181 Encounter for therapeutic drug level monitoring: Secondary | ICD-10-CM | POA: Diagnosis not present

## 2015-05-19 ENCOUNTER — Ambulatory Visit (HOSPITAL_COMMUNITY): Payer: Medicaid Other

## 2015-05-19 ENCOUNTER — Other Ambulatory Visit: Payer: Self-pay | Admitting: Adult Health

## 2015-05-19 DIAGNOSIS — I6330 Cerebral infarction due to thrombosis of unspecified cerebral artery: Secondary | ICD-10-CM | POA: Diagnosis not present

## 2015-05-19 DIAGNOSIS — I10 Essential (primary) hypertension: Secondary | ICD-10-CM | POA: Diagnosis not present

## 2015-05-19 DIAGNOSIS — E139 Other specified diabetes mellitus without complications: Secondary | ICD-10-CM | POA: Diagnosis not present

## 2015-05-19 NOTE — Telephone Encounter (Signed)
I have no problem filling it, but she should contact Cardiology for this medication

## 2015-05-19 NOTE — Telephone Encounter (Signed)
Would you like to refill this or Cardiology?

## 2015-05-19 NOTE — Telephone Encounter (Signed)
Pt needs refill on plavix 75 mg #30 send to [facility]

## 2015-05-19 NOTE — Telephone Encounter (Signed)
Pls refill for pt.  Thanks!

## 2015-05-20 ENCOUNTER — Other Ambulatory Visit: Payer: Self-pay | Admitting: Adult Health

## 2015-05-20 ENCOUNTER — Other Ambulatory Visit: Payer: Self-pay | Admitting: *Deleted

## 2015-05-20 ENCOUNTER — Ambulatory Visit (HOSPITAL_COMMUNITY): Payer: Commercial Managed Care - HMO

## 2015-05-20 ENCOUNTER — Encounter: Payer: Self-pay | Admitting: *Deleted

## 2015-05-20 DIAGNOSIS — I6330 Cerebral infarction due to thrombosis of unspecified cerebral artery: Secondary | ICD-10-CM | POA: Diagnosis not present

## 2015-05-20 DIAGNOSIS — I10 Essential (primary) hypertension: Secondary | ICD-10-CM | POA: Diagnosis not present

## 2015-05-20 DIAGNOSIS — E139 Other specified diabetes mellitus without complications: Secondary | ICD-10-CM | POA: Diagnosis not present

## 2015-05-20 MED ORDER — BACLOFEN 10 MG PO TABS: 10.0000 mg | ORAL_TABLET | Freq: Two times a day (BID) | ORAL | Status: DC

## 2015-05-20 NOTE — Patient Outreach (Signed)
[facility]) Care Management  05/20/2015  HILDUR BAYER [birth year] [phone]   Referral from [facility] and assigned to Colleen Can, RN for patient outreach.  Schuyler Behan L. Dorcas Carrow [facility] Care Management Assistant [phone]

## 2015-05-20 NOTE — Telephone Encounter (Signed)
Sent in

## 2015-05-20 NOTE — Patient Outreach (Signed)
[facility]) Care Management  05/20/2015  Madison [name] [birth year] [id number]  Referral from [facility]:  [facility] community care coordinator services recently closed out with goals met 03/21/2015.   Telephone call to patient; Hippa verification received. Patient advised of reason for call and of [facility] care management services. Patient states currently she does not have any major health care concerns. States she is seeing primary care doctor as scheduled with most recent visit May 13, 2015.   Uses transportation services ([employer]) to go to all of doctor's appointments. States she gets medications from local pharmacy without problems.  No problems with co-pays. Taking medications as prescribed by her doctor and understands importance of medication adherence. . Seeing specialists as scheduled without difficulty.   Has support of family & friends when needed.  Patient declined [facility] care management services at this time and thanked RN CM for call.  Plan: will close case and send MD closure letter.   Sherrin Daisy, RN BSN [place] Management Coordinator [facility] Care Management  [phone]

## 2015-05-21 DIAGNOSIS — I6330 Cerebral infarction due to thrombosis of unspecified cerebral artery: Secondary | ICD-10-CM | POA: Diagnosis not present

## 2015-05-21 DIAGNOSIS — I10 Essential (primary) hypertension: Secondary | ICD-10-CM | POA: Diagnosis not present

## 2015-05-21 DIAGNOSIS — E139 Other specified diabetes mellitus without complications: Secondary | ICD-10-CM | POA: Diagnosis not present

## 2015-05-22 DIAGNOSIS — I10 Essential (primary) hypertension: Secondary | ICD-10-CM | POA: Diagnosis not present

## 2015-05-22 DIAGNOSIS — E139 Other specified diabetes mellitus without complications: Secondary | ICD-10-CM | POA: Diagnosis not present

## 2015-05-22 DIAGNOSIS — I6330 Cerebral infarction due to thrombosis of unspecified cerebral artery: Secondary | ICD-10-CM | POA: Diagnosis not present

## 2015-05-23 DIAGNOSIS — I6330 Cerebral infarction due to thrombosis of unspecified cerebral artery: Secondary | ICD-10-CM | POA: Diagnosis not present

## 2015-05-23 DIAGNOSIS — I10 Essential (primary) hypertension: Secondary | ICD-10-CM | POA: Diagnosis not present

## 2015-05-23 DIAGNOSIS — E139 Other specified diabetes mellitus without complications: Secondary | ICD-10-CM | POA: Diagnosis not present

## 2015-05-23 MED ORDER — CLOPIDOGREL BISULFATE 75 MG PO TABS: 75.0000 mg | ORAL_TABLET | Freq: Every day | ORAL | Status: DC

## 2015-05-24 ENCOUNTER — Telehealth: Payer: Self-pay | Admitting: Endocrinology

## 2015-05-24 ENCOUNTER — Other Ambulatory Visit: Payer: Self-pay | Admitting: Family

## 2015-05-24 ENCOUNTER — Telehealth: Payer: Self-pay | Admitting: Cardiovascular Disease

## 2015-05-24 DIAGNOSIS — I6330 Cerebral infarction due to thrombosis of unspecified cerebral artery: Secondary | ICD-10-CM | POA: Diagnosis not present

## 2015-05-24 DIAGNOSIS — I10 Essential (primary) hypertension: Secondary | ICD-10-CM | POA: Diagnosis not present

## 2015-05-24 DIAGNOSIS — E139 Other specified diabetes mellitus without complications: Secondary | ICD-10-CM | POA: Diagnosis not present

## 2015-05-24 NOTE — Telephone Encounter (Signed)
Pt will be calling the office back to inform of fax number to send her medical information to.  Will then defer this message to medical records once correct contact information given to our office by the pt, to send records.

## 2015-05-24 NOTE — Telephone Encounter (Signed)
Pt calling back regarding the letter that was to be sent to [place] F# [phone] attn: ms williams

## 2015-05-24 NOTE — Telephone Encounter (Signed)
Will forward to Medical Records for review and advisement.

## 2015-05-24 NOTE — Telephone Encounter (Signed)
New message     Patient calling need office notes fax back    Are you calling in reference to your FMLA or disability form? No   1. What is your question in regards to FMLA or disability form?  No questions    2. Do you need copies of your medical records? No / ffice stating they never receive the medical records.   Fax to [place] - will call back with the number.   3. Are you waiting on a nurse to call you back with results or are you wanting copies of your results? No -   Patient will be calling back with the fax number.

## 2015-05-24 NOTE — Telephone Encounter (Signed)
Letter re-faxed per pt's request. Attempted to reach pt pt unavalible.

## 2015-05-24 NOTE — Telephone Encounter (Signed)
Follow up    Fax #[phone]

## 2015-05-25 ENCOUNTER — Ambulatory Visit (HOSPITAL_COMMUNITY): Payer: Commercial Managed Care - HMO

## 2015-05-25 ENCOUNTER — Ambulatory Visit (HOSPITAL_COMMUNITY): Payer: Medicaid Other

## 2015-05-25 DIAGNOSIS — E139 Other specified diabetes mellitus without complications: Secondary | ICD-10-CM | POA: Diagnosis not present

## 2015-05-25 DIAGNOSIS — I10 Essential (primary) hypertension: Secondary | ICD-10-CM | POA: Diagnosis not present

## 2015-05-25 DIAGNOSIS — I6330 Cerebral infarction due to thrombosis of unspecified cerebral artery: Secondary | ICD-10-CM | POA: Diagnosis not present

## 2015-05-26 ENCOUNTER — Ambulatory Visit (HOSPITAL_COMMUNITY): Payer: Medicaid Other

## 2015-05-26 DIAGNOSIS — E139 Other specified diabetes mellitus without complications: Secondary | ICD-10-CM | POA: Diagnosis not present

## 2015-05-26 DIAGNOSIS — I6330 Cerebral infarction due to thrombosis of unspecified cerebral artery: Secondary | ICD-10-CM | POA: Diagnosis not present

## 2015-05-26 DIAGNOSIS — I10 Essential (primary) hypertension: Secondary | ICD-10-CM | POA: Diagnosis not present

## 2015-05-26 NOTE — Patient Outreach (Signed)
[facility]) Care Management  05/24/2015  Madison [name] 1961-07-17 [id number]   Notification form Colleen Can, RN to close case due to patient declined [facility] Care Management. Services.  [name]. Sharlee Blew [facility] Care Management [facility] CM Assistant Phone: [phone] Fax: [phone]

## 2015-05-27 ENCOUNTER — Ambulatory Visit (HOSPITAL_COMMUNITY): Payer: Medicaid Other

## 2015-05-27 ENCOUNTER — Ambulatory Visit (HOSPITAL_COMMUNITY): Payer: Commercial Managed Care - HMO

## 2015-05-27 DIAGNOSIS — I10 Essential (primary) hypertension: Secondary | ICD-10-CM | POA: Diagnosis not present

## 2015-05-27 DIAGNOSIS — E139 Other specified diabetes mellitus without complications: Secondary | ICD-10-CM | POA: Diagnosis not present

## 2015-05-27 DIAGNOSIS — I6330 Cerebral infarction due to thrombosis of unspecified cerebral artery: Secondary | ICD-10-CM | POA: Diagnosis not present

## 2015-05-28 DIAGNOSIS — I6330 Cerebral infarction due to thrombosis of unspecified cerebral artery: Secondary | ICD-10-CM | POA: Diagnosis not present

## 2015-05-28 DIAGNOSIS — E139 Other specified diabetes mellitus without complications: Secondary | ICD-10-CM | POA: Diagnosis not present

## 2015-05-28 DIAGNOSIS — I10 Essential (primary) hypertension: Secondary | ICD-10-CM | POA: Diagnosis not present

## 2015-05-29 DIAGNOSIS — I10 Essential (primary) hypertension: Secondary | ICD-10-CM | POA: Diagnosis not present

## 2015-05-29 DIAGNOSIS — E139 Other specified diabetes mellitus without complications: Secondary | ICD-10-CM | POA: Diagnosis not present

## 2015-05-29 DIAGNOSIS — I6330 Cerebral infarction due to thrombosis of unspecified cerebral artery: Secondary | ICD-10-CM | POA: Diagnosis not present

## 2015-05-30 ENCOUNTER — Ambulatory Visit (HOSPITAL_COMMUNITY): Payer: Commercial Managed Care - HMO

## 2015-05-30 ENCOUNTER — Ambulatory Visit (HOSPITAL_COMMUNITY): Payer: Medicaid Other

## 2015-05-30 ENCOUNTER — Ambulatory Visit: Payer: Medicaid Other

## 2015-05-30 DIAGNOSIS — I10 Essential (primary) hypertension: Secondary | ICD-10-CM | POA: Diagnosis not present

## 2015-05-30 DIAGNOSIS — I6330 Cerebral infarction due to thrombosis of unspecified cerebral artery: Secondary | ICD-10-CM | POA: Diagnosis not present

## 2015-05-30 DIAGNOSIS — E139 Other specified diabetes mellitus without complications: Secondary | ICD-10-CM | POA: Diagnosis not present

## 2015-05-31 ENCOUNTER — Other Ambulatory Visit: Payer: Self-pay | Admitting: Family

## 2015-05-31 DIAGNOSIS — I6330 Cerebral infarction due to thrombosis of unspecified cerebral artery: Secondary | ICD-10-CM | POA: Diagnosis not present

## 2015-05-31 DIAGNOSIS — E139 Other specified diabetes mellitus without complications: Secondary | ICD-10-CM | POA: Diagnosis not present

## 2015-05-31 DIAGNOSIS — I10 Essential (primary) hypertension: Secondary | ICD-10-CM | POA: Diagnosis not present

## 2015-06-01 ENCOUNTER — Ambulatory Visit (HOSPITAL_COMMUNITY): Payer: Medicaid Other

## 2015-06-01 ENCOUNTER — Ambulatory Visit (HOSPITAL_COMMUNITY): Payer: Commercial Managed Care - HMO

## 2015-06-01 DIAGNOSIS — E139 Other specified diabetes mellitus without complications: Secondary | ICD-10-CM | POA: Diagnosis not present

## 2015-06-01 DIAGNOSIS — I10 Essential (primary) hypertension: Secondary | ICD-10-CM | POA: Diagnosis not present

## 2015-06-01 DIAGNOSIS — I6330 Cerebral infarction due to thrombosis of unspecified cerebral artery: Secondary | ICD-10-CM | POA: Diagnosis not present

## 2015-06-02 ENCOUNTER — Other Ambulatory Visit: Payer: Self-pay | Admitting: General Practice

## 2015-06-02 ENCOUNTER — Emergency Department (HOSPITAL_COMMUNITY)
Admission: EM | Admit: 2015-06-02 | Discharge: 2015-06-03 | Disposition: A | Discharge status: Home / Self Care | Payer: Commercial Managed Care - HMO | Attending: Emergency Medicine | Admitting: Emergency Medicine

## 2015-06-02 ENCOUNTER — Inpatient Hospital Stay (HOSPITAL_COMMUNITY)
Admission: RE | Admit: 2015-06-02 | Discharge: 2015-06-02 | Disposition: A | Discharge status: Home / Self Care | Payer: Medicaid Other | Source: Ambulatory Visit

## 2015-06-02 ENCOUNTER — Encounter (HOSPITAL_COMMUNITY): Payer: Self-pay | Admitting: Neurology

## 2015-06-02 ENCOUNTER — Telehealth: Payer: Self-pay

## 2015-06-02 ENCOUNTER — Ambulatory Visit (INDEPENDENT_AMBULATORY_CARE_PROVIDER_SITE_OTHER): Payer: Commercial Managed Care - HMO | Admitting: General Practice

## 2015-06-02 DIAGNOSIS — Z792 Long term (current) use of antibiotics: Secondary | ICD-10-CM | POA: Insufficient documentation

## 2015-06-02 DIAGNOSIS — E1165 Type 2 diabetes mellitus with hyperglycemia: Secondary | ICD-10-CM | POA: Insufficient documentation

## 2015-06-02 DIAGNOSIS — E780 Pure hypercholesterolemia: Secondary | ICD-10-CM | POA: Insufficient documentation

## 2015-06-02 DIAGNOSIS — R011 Cardiac murmur, unspecified: Secondary | ICD-10-CM | POA: Insufficient documentation

## 2015-06-02 DIAGNOSIS — Z952 Presence of prosthetic heart valve: Secondary | ICD-10-CM

## 2015-06-02 DIAGNOSIS — Z8701 Personal history of pneumonia (recurrent): Secondary | ICD-10-CM | POA: Diagnosis not present

## 2015-06-02 DIAGNOSIS — Z9889 Other specified postprocedural states: Secondary | ICD-10-CM | POA: Diagnosis not present

## 2015-06-02 DIAGNOSIS — Z79899 Other long term (current) drug therapy: Secondary | ICD-10-CM | POA: Diagnosis not present

## 2015-06-02 DIAGNOSIS — E785 Hyperlipidemia, unspecified: Secondary | ICD-10-CM | POA: Diagnosis not present

## 2015-06-02 DIAGNOSIS — I10 Essential (primary) hypertension: Secondary | ICD-10-CM | POA: Insufficient documentation

## 2015-06-02 DIAGNOSIS — M069 Rheumatoid arthritis, unspecified: Secondary | ICD-10-CM | POA: Diagnosis not present

## 2015-06-02 DIAGNOSIS — Z8673 Personal history of transient ischemic attack (TIA), and cerebral infarction without residual deficits: Secondary | ICD-10-CM | POA: Insufficient documentation

## 2015-06-02 DIAGNOSIS — R7989 Other specified abnormal findings of blood chemistry: Secondary | ICD-10-CM

## 2015-06-02 DIAGNOSIS — Z7982 Long term (current) use of aspirin: Secondary | ICD-10-CM | POA: Insufficient documentation

## 2015-06-02 DIAGNOSIS — I509 Heart failure, unspecified: Secondary | ICD-10-CM | POA: Insufficient documentation

## 2015-06-02 DIAGNOSIS — I2510 Atherosclerotic heart disease of native coronary artery without angina pectoris: Secondary | ICD-10-CM | POA: Diagnosis not present

## 2015-06-02 DIAGNOSIS — E139 Other specified diabetes mellitus without complications: Secondary | ICD-10-CM | POA: Diagnosis not present

## 2015-06-02 DIAGNOSIS — I252 Old myocardial infarction: Secondary | ICD-10-CM | POA: Insufficient documentation

## 2015-06-02 DIAGNOSIS — K219 Gastro-esophageal reflux disease without esophagitis: Secondary | ICD-10-CM | POA: Diagnosis not present

## 2015-06-02 DIAGNOSIS — Z954 Presence of other heart-valve replacement: Secondary | ICD-10-CM | POA: Diagnosis not present

## 2015-06-02 DIAGNOSIS — I6330 Cerebral infarction due to thrombosis of unspecified cerebral artery: Secondary | ICD-10-CM | POA: Diagnosis not present

## 2015-06-02 DIAGNOSIS — Z7901 Long term (current) use of anticoagulants: Secondary | ICD-10-CM | POA: Insufficient documentation

## 2015-06-02 DIAGNOSIS — R739 Hyperglycemia, unspecified: Secondary | ICD-10-CM

## 2015-06-02 DIAGNOSIS — E669 Obesity, unspecified: Secondary | ICD-10-CM | POA: Diagnosis not present

## 2015-06-02 DIAGNOSIS — R791 Abnormal coagulation profile: Secondary | ICD-10-CM | POA: Diagnosis not present

## 2015-06-02 DIAGNOSIS — Z794 Long term (current) use of insulin: Secondary | ICD-10-CM | POA: Insufficient documentation

## 2015-06-02 DIAGNOSIS — E875 Hyperkalemia: Secondary | ICD-10-CM | POA: Diagnosis not present

## 2015-06-02 LAB — BASIC METABOLIC PANEL
Anion gap: 15 (ref 5–15)
BUN: 23 mg/dL — ABNORMAL HIGH (ref 6–20)
CALCIUM: 8.7 mg/dL — AB (ref 8.9–10.3)
CO2: 17 mmol/L — ABNORMAL LOW (ref 22–32)
Chloride: 90 mmol/L — ABNORMAL LOW (ref 101–111)
Creatinine, Ser: 1.38 mg/dL — ABNORMAL HIGH (ref 0.44–1.00)
GFR calc non Af Amer: 42 mL/min — ABNORMAL LOW (ref >60)
GFR, EST AFRICAN AMERICAN: 49 mL/min — AB (ref >60)
GLUCOSE: 876 mg/dL — AB (ref 65–99)
Potassium: 5.8 mmol/L — ABNORMAL HIGH (ref 3.5–5.1)
SODIUM: 122 mmol/L — AB (ref 135–145)

## 2015-06-02 LAB — CBC WITH DIFFERENTIAL/PLATELET
Basophils Absolute: 0 10*3/uL (ref 0.0–0.1)
Basophils Relative: 0 % (ref 0–1)
Eosinophils Absolute: 0.1 10*3/uL (ref 0.0–0.7)
Eosinophils Relative: 1 % (ref 0–5)
HEMATOCRIT: 30.2 % — AB (ref 36.0–46.0)
Hemoglobin: 9.1 g/dL — ABNORMAL LOW (ref 12.0–15.0)
Lymphocytes Relative: 23 % (ref 12–46)
Lymphs Abs: 2.1 10*3/uL (ref 0.7–4.0)
MCH: 22.4 pg — ABNORMAL LOW (ref 26.0–34.0)
MCHC: 30.1 g/dL (ref 30.0–36.0)
MCV: 74.4 fL — ABNORMAL LOW (ref 78.0–100.0)
MONO ABS: 0.3 10*3/uL (ref 0.1–1.0)
Monocytes Relative: 3 % (ref 3–12)
NEUTROS ABS: 6.5 10*3/uL (ref 1.7–7.7)
Neutrophils Relative %: 73 % (ref 43–77)
Platelets: 294 10*3/uL (ref 150–400)
RBC: 4.06 MIL/uL (ref 3.87–5.11)
RDW: 18.1 % — ABNORMAL HIGH (ref 11.5–15.5)
WBC: 9 10*3/uL (ref 4.0–10.5)

## 2015-06-02 LAB — PROTIME-INR
INR: 10 ratio — AB (ref 0.8–1.0)
Prothrombin Time: 103.5 s (ref 9.6–13.1)

## 2015-06-02 MED ORDER — SODIUM CHLORIDE 0.9 % IV BOLUS (SEPSIS)
1000.0000 mL | Freq: Once | INTRAVENOUS | Status: AC
  Administered 2015-06-02: 1000 mL via INTRAVENOUS

## 2015-06-02 MED ORDER — INSULIN ASPART 100 UNIT/ML ~~LOC~~ SOLN
15.0000 [IU] | Freq: Once | SUBCUTANEOUS | Status: AC
  Administered 2015-06-02: 15 [IU] via INTRAVENOUS
  Filled 2015-06-02: qty 1

## 2015-06-02 MED ORDER — PHYTONADIONE 5 MG PO TABS: ORAL_TABLET | ORAL | Status: DC

## 2015-06-02 MED ORDER — PHYTONADIONE 5 MG PO TABS
ORAL_TABLET | ORAL | Status: DC
Start: 2015-06-02 — End: 2015-06-02

## 2015-06-02 MED ORDER — PHYTONADIONE 5 MG PO TABS
5.0000 mg | ORAL_TABLET | Freq: Once | ORAL | Status: AC
  Administered 2015-06-02: 5 mg via ORAL
  Filled 2015-06-02: qty 1

## 2015-06-02 MED ORDER — INSULIN ASPART 100 UNIT/ML ~~LOC~~ SOLN
10.0000 [IU] | Freq: Once | SUBCUTANEOUS | Status: AC
  Administered 2015-06-02: 10 [IU] via INTRAVENOUS
  Filled 2015-06-02: qty 1

## 2015-06-02 NOTE — Discharge Instructions (Signed)
Please follow with your primary care doctor in the next 2 days for a check-up. They must obtain records for further management.   Do not hesitate to return to the Emergency Department for any new, worsening or concerning symptoms.    Warfarin Coagulopathy Warfarin (Coumadin) coagulopathy refers to bleeding that may occur as a complication of the medicine warfarin. Warfarin is an oral blood thinner (anticoagulant). Warfarin is used for medical conditions where thinning of the blood is needed to prevent blood clots.  CAUSES Bleeding is the most common and most serious complication of warfarin. The amount of bleeding is related to the warfarin dose and length of treatment. In addition, bleeding complications can also occur due to:  Intentional or accidental warfarin overdose.  Underlying medical conditions.  Dietary changes.  Medicine, herbal, supplement, or alcohol interactions. SYMPTOMS Severe bleeding while on warfarin may occur from any tissue or organ. Symptoms of the blood being too thin may include:  Bleeding from the nose or gums.  Blood in bowel movements which may appear as bright red, dark, or black tarry stools.  Blood in the urine which may appear as pink, red, or brown urine.  Unusual bruising or bruising easily.  A cut that does not stop bleeding within 10 minutes.  Vomiting blood or continuous nausea for more than 1 day.  Coughing up blood.  Broken blood vessels in your eye (subconjunctival hemorrhage).  Abdominal or back pain with or without flank bruising.  Sudden, severe headache.  Sudden weakness or numbness of the face, arm, or leg, especially on one side of the body.  Sudden confusion.  Trouble speaking (aphasia) or understanding.  Sudden trouble seeing in one or both eyes.  Sudden trouble walking.  Dizziness.  Loss of balance or coordination.  Vaginal bleeding.  Swelling or pain at an injection site.  Superficial fat tissue death  (necrosis) which may cause skin scarring. This is more common in women and may first present as pain in the waist, thighs, or buttocks.  Fever. HOME CARE INSTRUCTIONS  Always contact your health care provider of any concerns or signs of possible warfarin coagulopathy as soon as possible.  Take warfarin exactly as directed by your health care provider. It is recommended that you take your warfarin dose at the same time of the day. If you have been told to stop taking warfarin, do not resume taking warfarin until directed to do so by your health care provider. Follow your health care provider's instructions if you accidentally take an extra dose or miss a dose of warfarin. It is very important to take warfarin as directed since bleeding or blood clots could result in chronic or permanent injury, pain, or disability.  Keep all follow-up appointments with your health care provider as directed. It is very important to keep your appointments. Not keeping appointments could result in a chronic or permanent injury, pain, or disability because warfarin is a medicine that requires close monitoring.  While taking warfarin, you will need to have regular blood tests to measure your blood clotting time. These blood tests usually include both the prothrombin time (PT) and International Normalized Ratio (INR) tests. The PT and INR results allow your health care provider to adjust your dose of warfarin. The dose can change for many reasons. It is critically important that you have your PT and INR levels drawn exactly as directed. Your warfarin dose may stay the same or change depending on what the PT and INR results are. Be sure to  follow up with your health care provider regarding your PT and INR test results and what your warfarin dosage should be.  Many medicines can interfere with warfarin and affect the PT and INR results. You must tell your health care provider about any and all medicines you take. This includes  all vitamins and supplements. Ask your health care provider before taking these. Prescription and over-the-counter medicine consistency is critical to warfarin management. It is important that potential interactions are checked before you start a new medicine. Be especially cautious with aspirin and anti-inflammatory medicines. Ask your health care provider before taking these. Medicines such as antibiotics and acid-reducing medicine can interact with warfarin and can cause an increased warfarin effect. Warfarin can also interfere with the effectiveness of medicines you are taking. Do not take or discontinue any prescribed or over-the-counter medicine except on the advice of your health care provider or pharmacist.  Some vitamins, supplements, and herbal products interfere with the effectiveness of warfarin. Vitamin E may increase the anticoagulant effects of warfarin. Vitamin K can cause warfarin to be less effective. Do not take or discontinue any vitamin, supplement, or herbal product except on the advice of your health care provider or pharmacist.  Eat what you normally eat and keep the vitamin K content of your diet consistent. Avoid major changes in your diet, or notify your health care provider before changing your diet. Suddenly getting a lot more vitamin K could cause your blood to clot too quickly. A sudden decrease in vitamin K intake could cause your blood to clot too slowly. These changes in vitamin K intake could lead to dangerous blood clotsor to bleeding. To keep your vitamin K intake consistent, you must be aware of which foods contain moderate or high amounts of vitamin K. Some foods high in vitamin K include spinach, kale, broccoli, cabbage, greens, Brussels sprouts, asparagus, bok choy, coleslaw, parsley, and green tea. Arrange a visit with a dietitian to answer your questions.  If you have a loss of appetite or get the stomach flu (viral gastroenteritis), talk to your health care provider  as soon as possible. A decrease in your normal vitamin K intake can make you more sensitive to your usual dose of warfarin.  Some medical conditions may increase your risk for bleeding while you are taking warfarin. A fever, diarrhea lasting more than a day, worsening heart failure, or worsening liver function are some medical conditions that could affect warfarin. Contact your health care provider if you have any of these medical conditions.  Be careful not to cut yourself when using sharp objects or while shaving.  Alcohol can change the body's ability to handle warfarin. It is best to avoid alcoholic drinks or consume only very small amounts while taking warfarin. Notify your health care provider if you change your alcohol intake. A sudden increase in alcohol use can increase your risk of bleeding. Chronic alcohol use can cause warfarin to be less effective.  Limit physical activities or sports that could result in a fall or cause injury.  Do not use warfarin if you are pregnant.  Inform all your health care providers and your dentist that you take warfarin.  Inform all health care providers if you are taking warfarin and aspirin or platelet inhibitor medicines such as clopidogrel, ticagrelor, or prasugrel. Use of these medicines in addition to warfarin can increase your risk of bleeding or death. Taking these medicines together should only be done under the direct care of your health care  providers. SEEK IMMEDIATE MEDICAL CARE IF:  You cough up blood.  You have dark or black stools or there is bright red blood coming from your rectum.  You vomit blood or have nausea for more than 1 day.  You have blood in the urine or pink-colored urine.  You have unusual bruising or have increased bruising.  You have bleeding from the nose or gums that does not stop quickly.  You have a cut that does not stop bleeding within 2-3 minutes.  You have sudden weakness or numbness of the face, arm, or  leg, especially on one side of the body.  You have sudden confusion.  You have trouble speaking (aphasia) or understanding.  You have sudden trouble seeing in one or both eyes.  You have sudden trouble walking.  You have dizziness.  You have a loss of balance or coordination.  You have a sudden, severe headache.  You have a serious fall or head injury, even if you are not bleeding.  You have swelling or pain at an injection site.  You have unexplained tenderness or pain in the abdomen, back, waist, thighs, or buttocks.  You have a fever. Any of these symptoms may represent a serious problem that is an emergency. Do not wait to see if the symptoms will go away. Get medical help right away. Call your local emergency services (911 in [place] Do not drive yourself to the hospital. Document Released: 10/14/2006 Document Revised: 03/22/2014 Document Reviewed: 04/15/2012 [facility] Patient Information 2015 [place], [place]. This information is not intended to replace advice given to you by your health care provider. Make sure you discuss any questions you have with your health care provider.

## 2015-06-02 NOTE — Progress Notes (Signed)
Pre visit review using our clinic review tool, if applicable. No additional management support is needed unless otherwise documented below in the visit note. 

## 2015-06-02 NOTE — Telephone Encounter (Signed)
Per Cory's request called Cardiac Rehab to advised that if pt tried to come there to send her to the ED for evaluation.  Called  and spoke with Marily Memos at the rehab center and was told that pt was sent to ED.

## 2015-06-02 NOTE — ED Notes (Signed)
Pt ambulated to and from the rest room with this RN

## 2015-06-02 NOTE — Progress Notes (Signed)
Pt in for today;s orientation appointment for cardiac rehab.  Pt stated that she was told to go to the ER after her INR was checked.  Questioned pt further and reviewed medical records in [facility].  Pt had INR drawn this am.  Result shows 10.0.  Telephone encounter indicates pt was told not to come to cardiac rehab but to go ER.  Pt placed in wheelchair and escorted to the ER by rehab staff.  ER alerted of plan to transport pt to ER.  Asked pt if rehab staff could call emergency contact.  Pt declined and indicated she had a cell phone and will call them herself. Alanson Aly, BSN

## 2015-06-02 NOTE — ED Notes (Signed)
IV team at bedside 

## 2015-06-02 NOTE — ED Notes (Signed)
Pts CBG [phone]

## 2015-06-02 NOTE — Telephone Encounter (Signed)
Spoke to patient on phone. Advised her that she needs to go to the ER due to her elevated INR and PT levels. She advised me that she was on her way to cardiac rehab and she would go to the ER after. I advised her that she is not to go to rehab and is to go directly to the ER. She was agreeable of this.

## 2015-06-02 NOTE — ED Notes (Signed)
CBG reading "HIGH" °

## 2015-06-02 NOTE — ED Notes (Addendum)
Pt reports coumadin level 10, originally doctor told her to go to pharmacy and get vitamin K. She went to cardiac rehab today and they told her to come here due to coumadin level.Blood was drawn today.

## 2015-06-02 NOTE — ED Provider Notes (Signed)
CSN: [id number]     Arrival date & time 06/02/15  1330 History   First MD Initiated Contact with Patient 06/02/15 1755     Chief Complaint  Patient presents with  . Abnormal Lab     (Consider location/radiation/quality/duration/timing/severity/associated sxs/prior Treatment) HPI   Blood pressure 119/70, pulse 91, temperature 98.2 F (36.8 C), temperature source Oral, resp. rate 16, weight 161 lb (73.029 kg), SpO2 97 %.  Madison [name] is a 54 y.o. female sent to the ED from cardiac rehabilitation facility for evaluation of elevated INR. Patient was aware that her INR was 10. She was advised to DC Coumadin which she takes for aortic valve replacement, she was given a prescription for oral vitamin K, she has not yet filled the prescription, she was advised to hold all Coumadin over the weekend and go back to the Coumadin clinic on Monday for recheck. Patient denies falls, trauma, headache, change in vision, chest pain, shortness of breath, abdominal pain, diarrhea, dark or bloody stools. She is status post CVA but states that she went to rehabilitation end that she is not a fall risk, she lives at home alone but she has a home health aide that checks on her daily.  Past Medical History  Diagnosis Date  . CAD (coronary artery disease)   . Obesity   . Hypercholesteremia   . HTN (hypertension)   . Dyslipidemia   . Aortic stenosis   . Thrombophlebitis   . Heart murmur   . Myocardial infarction 12/2014  . Pneumonia 11/2014; 12/2014  . Diabetes dx'd 1982    "went straight to insulin"  . GERD (gastroesophageal reflux disease)   . 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 Rheumatoid arthritis(714.0)     "hands" (01/19/2015)  . Anxiety   . Depression   . Allergy   . CHF (congestive heart failure)    Past Surgical History  Procedure Laterality Date  . Cesarean section  1995  . Foot fracture surgery    . Knee arthroscopy  Right   . Neuroplasty / transposition median nerve at carpal tunnel    . Left heart catheterization with coronary angiogram N/A 12/08/2014    Procedure: LEFT HEART CATHETERIZATION WITH CORONARY ANGIOGRAM;  Surgeon: Iran Ouch, MD;  Location: [facility] CATH LAB;  Service: Cardiovascular;  Laterality: N/A;  . Percutaneous coronary stent intervention (pci-s) N/A 12/09/2014    Procedure: PERCUTANEOUS CORONARY STENT INTERVENTION (PCI-S);  Surgeon: [name], MD;  Location: [facility] CATH LAB;  Service: Cardiovascular;  Laterality: N/A;  . Cardiac catheterization  12/08/2014  . Coronary angioplasty with stent placement  12/09/2014  . Aortic valve replacement (avr)/coronary artery bypass grafting (cabg)  "2014"    [name] 03/08/2014  . Fracture surgery    . Tubal ligation  1995  . Cataract extraction Left    Family History  Problem Relation Age of Onset  . Stroke    . Heart disease Mother   . Heart disease Sister    History  Substance Use Topics  . Smoking status: Never Smoker   . Smokeless tobacco: Never Used  . Alcohol Use: No   OB History    No data available     Review of Systems    Allergies  Celebrex and Detrol  Home Medications   Prior to Admission medications   Medication Sig Start Date End Date Taking? Authorizing Provider  aspirin 81 MG tablet  Take 1 tablet (81 mg total) by mouth daily. 10/11/14  Yes Leana Roe Elgergawy, MD  atorvastatin (LIPITOR) 20 MG tablet Take 20 mg by mouth daily.   Yes Historical Provider, MD  baclofen (LIORESAL) 10 MG tablet Take 1 tablet (10 mg total) by mouth 2 (two) times daily. 05/20/15  Yes [name], NP  clonazePAM (KLONOPIN) 0.5 MG tablet Take 0.5 mg by mouth 2 (two) times daily.   Yes Historical Provider, MD  escitalopram (LEXAPRO) 20 MG tablet Take 20 mg by mouth daily.   Yes Historical Provider, MD  feeding supplement, GLUCERNA SHAKE, (GLUCERNA SHAKE) LIQD Take 237 mLs by mouth 2 (two) times daily between meals. 12/15/14  Yes Nishant  Dhungel, MD  glucagon (GLUCAGON EMERGENCY) 1 MG injection Inject 1 mg into the vein once as needed. 02/08/15  Yes Romero Belling, MD  insulin aspart (NOVOLOG FLEXPEN) 100 UNIT/ML FlexPen Inject 2 units for any blood sugar over 250 03/07/15  Yes Romero Belling, MD  Insulin NPH, Human,, Isophane, (HUMULIN N) 100 UNIT/ML Kiwkpen Inject 28 Units into the skin every morning. And pen needles, 1/day 04/04/15  Yes Romero Belling, MD  lisinopril (PRINIVIL,ZESTRIL) 2.5 MG tablet TAKE 1 TABLET BY MOUTH EVERY DAY 04/05/15  Yes [name], NP  omeprazole (PRILOSEC) 40 MG capsule TAKE 1 CAPSULE (40 MG TOTAL) BY MOUTH DAILY. 11/05/14  Yes Nelwyn Salisbury, MD  pravastatin (PRAVACHOL) 20 MG tablet TAKE 1 TABLET BY MOUTH EVERY DAY 02/16/15  Yes Eulis Foster, FNP  RHEUMATREX 2.5 MG tablet TAKE 1 TABLET 2 TIMES A WEEK Patient taking differently: TAKE 1 TABLET 2 TIMES A WEEK (MON AND FRI) 05/24/15  Yes [name], NP  solifenacin (VESICARE) 5 MG tablet Take 1 tablet (5 mg total) by mouth daily. 05/03/15  Yes [name], NP  traMADol (ULTRAM) 50 MG tablet TAKE 1-2 TABLETS AT BEDTIME AS NEEDED FOR SLEEP 02/28/15  Yes Eulis Foster, FNP  cephALEXin (KEFLEX) 500 MG capsule Take 1 capsule (500 mg total) by mouth 2 (two) times daily. For UTI 04/10/15   [name], NP  clopidogrel (PLAVIX) 75 MG tablet Take 1 tablet (75 mg total) by mouth daily with breakfast. Patient not taking: Reported on 06/02/2015 05/23/15   Wendall Stade, MD  glucose blood (ACCU-CHEK AVIVA) test strip Use to check blood sugar 5 times per day Dx Code E10.10 And lancets 5/day 01/10/15   Romero Belling, MD  metoprolol tartrate (LOPRESSOR) 25 MG tablet Take 0.5 tablets (12.5 mg total) by mouth 2 (two) times daily. Patient taking differently: Take 25 mg by mouth daily.  12/15/14   Nishant Dhungel, MD  phytonadione (MEPHYTON) 5 MG tablet Take 1/2 tablet (2.5 mg) NOW! 06/02/15   [name], NP  promethazine (PHENERGAN) 25 MG tablet Take 1 tablet (25 mg total) by  mouth every 4 (four) hours as needed for nausea or vomiting. 10/08/14   Nelwyn Salisbury, MD  warfarin (COUMADIN) 5 MG tablet TAKE 1-1.5 BY MOUTH DAILY AT 6PM. TAKE 1 TAB DAILY TUES,TH,F,SAT & SUN. TAKE 1& 1/2 TAB ON MON &WED Patient taking differently: TAKE 1.5 TABS BY MOUTH DAILY AT 6PM. TAKE 1.5 TABS DAILY TUES,TH,F,SAT & SUN. TAKE 2 TABS ON MON & FRI 05/16/15   Nishant Dhungel, MD   BP 105/37 mmHg  Pulse 81  Temp(Src) 98.2 F (36.8 C) (Oral)  Resp 23  Wt 161 lb (73.029 kg)  SpO2 98% Physical Exam  Constitutional: She is oriented to person, place, and time. She appears well-developed  and well-nourished. No distress.  Slurred speech  HENT:  Head: Normocephalic and atraumatic.  Mouth/Throat: Oropharynx is clear and moist.  Eyes: Conjunctivae and EOM are normal. Pupils are equal, round, and reactive to light.  Neck: Normal range of motion.  Cardiovascular: Normal rate and regular rhythm.   Pulmonary/Chest: Effort normal and breath sounds normal. No stridor. No respiratory distress. She has no wheezes. She has no rales. She exhibits no tenderness.  Abdominal: Soft. Bowel sounds are normal. She exhibits no distension and no mass. There is no tenderness. There is no rebound and no guarding.  Musculoskeletal: Normal range of motion.  Full left upper arm is held in flexion, decreased strength at 4 out of 5 to the left arm, sensation is intact. Otherwise patient is moving all extremities  Patient has distal avulsion of the left great toenail, no warmth or discharge, tenderness to palpation to suggest infection. States that the toenails were long and it accidentally caught on something and ripped off.  Neurological: She is alert and oriented to person, place, and time.  Psychiatric: She has a normal mood and affect.  Nursing note and vitals reviewed.   ED Course  Procedures (including critical care time) Labs Review Labs Reviewed  CBC WITH DIFFERENTIAL/PLATELET - Abnormal; Notable for the  following:    Hemoglobin 9.1 (*)    HCT 30.2 (*)    MCV 74.4 (*)    MCH 22.4 (*)    RDW 18.1 (*)    All other components within normal limits  BASIC METABOLIC PANEL - Abnormal; Notable for the following:    Sodium 122 (*)    Potassium 5.8 (*)    Chloride 90 (*)    CO2 17 (*)    Glucose, Bld 876 (*)    BUN 23 (*)    Creatinine, Ser 1.38 (*)    Calcium 8.7 (*)    GFR calc non Af Amer 42 (*)    GFR calc Af Amer 49 (*)    All other components within normal limits  URINALYSIS, ROUTINE W REFLEX MICROSCOPIC (NOT AT [facility])  URINALYSIS, ROUTINE W REFLEX MICROSCOPIC (NOT AT [facility])  URINE MICROSCOPIC-ADD ON  CBG MONITORING, ED    Imaging Review No results found.   EKG Interpretation None      MDM   Final diagnoses:  Supratherapeutic INR  Hyperkalemia  Elevated serum creatinine  Hyperglycemia without ketosis    Filed Vitals:   06/02/15 2215 06/02/15 2230 06/02/15 2245 06/02/15 2323  BP: 117/47 117/48 110/48 105/37  Pulse: 81 83 82 81  Temp:      TempSrc:      Resp: 21 21 24 23   Weight:      SpO2: 100% 100% 99% 98%    Medications  phytonadione (VITAMIN K) tablet 5 mg (5 mg Oral Given 06/02/15 1845)  sodium chloride 0.9 % bolus 1,000 mL (0 mLs Intravenous Stopped 06/02/15 2256)  insulin aspart (novoLOG) injection 15 Units (15 Units Intravenous Given 06/02/15 2027)  sodium chloride 0.9 % bolus 1,000 mL (0 mLs Intravenous Stopped 06/02/15 2256)  insulin aspart (novoLOG) injection 10 Units (10 Units Intravenous Given 06/02/15 2154)    Madison [name] is a pleasant 54 y.o. female presenting with supratherapeutic INR, no bleeding. Patient lives alone but has home health aide that digits every day, she states that she has not a fall precaution. We've had an extensive discussion of signs of internal bleeding, patient verbalizes her understanding. She will be given vitamin K 5 mg  orally, she knows not to take her Coumadin over the weekend, she will follow with Coumadin clinic first  thing Monday morning.   She has a very small IV in the hands, recheck CBG shows that it is still reading high. His potassium is mildly elevated at 5.8. Her EKG shows no changes. Patient will be given insulin, that should bring down the potassium, this is likely secondary to slight hemolysis, but there significant difficulty in getting blood work and IV access for her. She has a mild acute kidney injury with a creatinine of 1.38.  There is an issue with lab results crossing over, was given a urine sample but no results are back.  Blood glucose has improved to under 300, I have again reviewed with her return precautions she verbalizes her understanding. And has an appointment with her endocrinologist Monday at 3 PM, she will see be seen at the Coumadin clinic at 11 AM.  This is a shared visit with the attending physician who personally evaluated the patient and agrees with the care plan.   Evaluation does not show pathology that would require ongoing emergent intervention or inpatient treatment. Pt is hemodynamically stable and mentating appropriately. Discussed findings and plan with patient/guardian, who agrees with care plan. All questions answered. Return precautions discussed and outpatient follow up given.         Wynetta Emery, PA-C 06/02/15 [id number]  Gwyneth Sprout, MD 06/03/15 2230

## 2015-06-03 ENCOUNTER — Ambulatory Visit (HOSPITAL_COMMUNITY): Payer: Medicaid Other

## 2015-06-03 ENCOUNTER — Ambulatory Visit (HOSPITAL_COMMUNITY): Payer: Commercial Managed Care - HMO

## 2015-06-03 DIAGNOSIS — I6330 Cerebral infarction due to thrombosis of unspecified cerebral artery: Secondary | ICD-10-CM | POA: Diagnosis not present

## 2015-06-03 DIAGNOSIS — I10 Essential (primary) hypertension: Secondary | ICD-10-CM | POA: Diagnosis not present

## 2015-06-03 DIAGNOSIS — E139 Other specified diabetes mellitus without complications: Secondary | ICD-10-CM | POA: Diagnosis not present

## 2015-06-03 LAB — URINE MICROSCOPIC-ADD ON

## 2015-06-03 LAB — URINALYSIS, ROUTINE W REFLEX MICROSCOPIC
Bilirubin Urine: NEGATIVE
Hgb urine dipstick: NEGATIVE
KETONES UR: 40 mg/dL — AB
Leukocytes, UA: NEGATIVE
Nitrite: NEGATIVE
PH: 5 (ref 5.0–8.0)
Protein, ur: NEGATIVE mg/dL
Specific Gravity, Urine: 1.026 (ref 1.005–1.030)
UROBILINOGEN UA: 0.2 mg/dL (ref 0.0–1.0)

## 2015-06-03 LAB — CBG MONITORING, ED
Glucose-Capillary: >600 mg/dL (ref 65–99)
Glucose-Capillary: 279 mg/dL — ABNORMAL HIGH (ref 65–99)

## 2015-06-04 DIAGNOSIS — I10 Essential (primary) hypertension: Secondary | ICD-10-CM | POA: Diagnosis not present

## 2015-06-04 DIAGNOSIS — E139 Other specified diabetes mellitus without complications: Secondary | ICD-10-CM | POA: Diagnosis not present

## 2015-06-04 DIAGNOSIS — I6330 Cerebral infarction due to thrombosis of unspecified cerebral artery: Secondary | ICD-10-CM | POA: Diagnosis not present

## 2015-06-05 DIAGNOSIS — I10 Essential (primary) hypertension: Secondary | ICD-10-CM | POA: Diagnosis not present

## 2015-06-05 DIAGNOSIS — E139 Other specified diabetes mellitus without complications: Secondary | ICD-10-CM | POA: Diagnosis not present

## 2015-06-05 DIAGNOSIS — I6330 Cerebral infarction due to thrombosis of unspecified cerebral artery: Secondary | ICD-10-CM | POA: Diagnosis not present

## 2015-06-06 ENCOUNTER — Ambulatory Visit: Payer: Medicaid Other

## 2015-06-06 ENCOUNTER — Ambulatory Visit (INDEPENDENT_AMBULATORY_CARE_PROVIDER_SITE_OTHER): Payer: Commercial Managed Care - HMO | Admitting: General Practice

## 2015-06-06 ENCOUNTER — Ambulatory Visit: Payer: Commercial Managed Care - HMO | Admitting: Endocrinology

## 2015-06-06 ENCOUNTER — Ambulatory Visit (HOSPITAL_COMMUNITY): Payer: Commercial Managed Care - HMO

## 2015-06-06 ENCOUNTER — Ambulatory Visit (HOSPITAL_COMMUNITY): Payer: Medicaid Other

## 2015-06-06 ENCOUNTER — Ambulatory Visit (INDEPENDENT_AMBULATORY_CARE_PROVIDER_SITE_OTHER): Payer: Commercial Managed Care - HMO | Admitting: Endocrinology

## 2015-06-06 ENCOUNTER — Encounter: Payer: Self-pay | Admitting: Endocrinology

## 2015-06-06 ENCOUNTER — Other Ambulatory Visit: Payer: Self-pay

## 2015-06-06 VITALS — BP 124/72 | HR 103 | Temp 98.5°F | Resp 16 | Ht 63.0 in | Wt 163.0 lb

## 2015-06-06 DIAGNOSIS — Z952 Presence of prosthetic heart valve: Secondary | ICD-10-CM

## 2015-06-06 DIAGNOSIS — I6330 Cerebral infarction due to thrombosis of unspecified cerebral artery: Secondary | ICD-10-CM | POA: Diagnosis not present

## 2015-06-06 DIAGNOSIS — E1049 Type 1 diabetes mellitus with other diabetic neurological complication: Secondary | ICD-10-CM | POA: Diagnosis not present

## 2015-06-06 DIAGNOSIS — Z954 Presence of other heart-valve replacement: Secondary | ICD-10-CM | POA: Diagnosis not present

## 2015-06-06 DIAGNOSIS — E139 Other specified diabetes mellitus without complications: Secondary | ICD-10-CM | POA: Diagnosis not present

## 2015-06-06 DIAGNOSIS — I10 Essential (primary) hypertension: Secondary | ICD-10-CM | POA: Diagnosis not present

## 2015-06-06 LAB — POCT INR: INR: 1.1

## 2015-06-06 LAB — POCT GLYCOSYLATED HEMOGLOBIN (HGB A1C): Hemoglobin A1C: 11.6

## 2015-06-06 MED ORDER — INSULIN ISOPHANE HUMAN 100 UNIT/ML KWIKPEN: 35.0000 [IU] | PEN_INJECTOR | SUBCUTANEOUS | Status: DC

## 2015-06-06 NOTE — Progress Notes (Signed)
Pre visit review using our clinic review tool, if applicable. No additional management support is needed unless otherwise documented below in the visit note. 

## 2015-06-06 NOTE — Progress Notes (Signed)
Subjective:    Patient ID: Madison [name], female    DOB: 16-Sep-1961, 54 y.o.   MRN: [id number]  HPI Pt returns for f/u of diabetes mellitus: DM type: Insulin-requiring type 2 Dx'ed: 1981 Complications: polyneuropathy, retinopathy, CVA's, and CAD.   Therapy: insulin since dx.   GDM: never. DKA: last episode was in 2016.  Severe hypoglycemia: last episode was in 2014.   Pancreatitis: never.  Other: she was on insulin pump rx until CVA's prevented her from using the device in early 2014; in early 2015, she was changed to a simple insulin schedule, due to poor results with multiple daily injections.  In 2015, she changed from levemir to NPH, due to the pattern of her cbg's.  Interval history: She takes NPH only.  She has mild hypoglycemia with exercise, or if a meal is missed.  no cbg record, but states cbg's are otherwise consistently over 200.   Past Medical History  Diagnosis Date  . CAD (coronary artery disease)   . Obesity   . Hypercholesteremia   . HTN (hypertension)   . Dyslipidemia   . Aortic stenosis   . Thrombophlebitis   . Heart murmur   . Myocardial infarction 12/2014  . Pneumonia 11/2014; 12/2014  . Diabetes dx'd 1982    "went straight to insulin"  . GERD (gastroesophageal reflux disease)   . 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 Rheumatoid arthritis(714.0)     "hands" (01/19/2015)  . Anxiety   . Depression   . Allergy   . CHF (congestive heart failure)     Past Surgical History  Procedure Laterality Date  . Cesarean section  1995  . Foot fracture surgery    . Knee arthroscopy Right   . Neuroplasty / transposition median nerve at carpal tunnel    . Left heart catheterization with coronary angiogram N/A 12/08/2014    Procedure: LEFT HEART CATHETERIZATION WITH CORONARY ANGIOGRAM;  Surgeon: Iran Ouch, MD;  Location: [facility] CATH LAB;  Service: Cardiovascular;  Laterality: N/A;  . Percutaneous  coronary stent intervention (pci-s) N/A 12/09/2014    Procedure: PERCUTANEOUS CORONARY STENT INTERVENTION (PCI-S);  Surgeon: [name], MD;  Location: [facility] CATH LAB;  Service: Cardiovascular;  Laterality: N/A;  . Cardiac catheterization  12/08/2014  . Coronary angioplasty with stent placement  12/09/2014  . Aortic valve replacement (avr)/coronary artery bypass grafting (cabg)  "2014"    [name] 03/08/2014  . Fracture surgery    . Tubal ligation  1995  . Cataract extraction Left     History   Social History  . Marital Status: Divorced    Spouse Name: N/A  . Number of Children: N/A  . Years of Education: N/A   Occupational History  . Not on file.   Social History Main Topics  . Smoking status: Never Smoker   . Smokeless tobacco: Never Used  . Alcohol Use: No  . Drug Use: No  . Sexual Activity: Not on file   Other Topics Concern  . Not on file   Social History Narrative   Divroced and lives at home.    Has two sons who live in [place] at this time. (21&8 y/o).    Has a cat    Walks a lot.       She is having great success with her diet       Working with exercises that were given to  her by PT/OT.     Current Outpatient Prescriptions on File Prior to Visit  Medication Sig Dispense Refill  . aspirin 81 MG tablet Take 1 tablet (81 mg total) by mouth daily. 30 tablet   . atorvastatin (LIPITOR) 20 MG tablet Take 20 mg by mouth daily.    . baclofen (LIORESAL) 10 MG tablet Take 1 tablet (10 mg total) by mouth 2 (two) times daily. 60 tablet 0  . cephALEXin (KEFLEX) 500 MG capsule Take 1 capsule (500 mg total) by mouth 2 (two) times daily. For UTI 20 capsule 0  . clonazePAM (KLONOPIN) 0.5 MG tablet Take 0.5 mg by mouth 2 (two) times daily.    . clopidogrel (PLAVIX) 75 MG tablet Take 1 tablet (75 mg total) by mouth daily with breakfast. 30 tablet 2  . escitalopram (LEXAPRO) 20 MG tablet Take 20 mg by mouth daily.    . feeding supplement, GLUCERNA SHAKE, (GLUCERNA SHAKE) LIQD Take  237 mLs by mouth 2 (two) times daily between meals. 60 Can 0  . glucagon (GLUCAGON EMERGENCY) 1 MG injection Inject 1 mg into the vein once as needed. 1 each 5  . glucose blood (ACCU-CHEK AVIVA) test strip Use to check blood sugar 5 times per day Dx Code E10.10 And lancets 5/day 150 each 11  . lisinopril (PRINIVIL,ZESTRIL) 2.5 MG tablet TAKE 1 TABLET BY MOUTH EVERY DAY 30 tablet 5  . metoprolol tartrate (LOPRESSOR) 25 MG tablet Take 0.5 tablets (12.5 mg total) by mouth 2 (two) times daily. (Patient taking differently: Take 25 mg by mouth daily. ) 60 tablet 0  . omeprazole (PRILOSEC) 40 MG capsule TAKE 1 CAPSULE (40 MG TOTAL) BY MOUTH DAILY. 30 capsule 6  . phytonadione (MEPHYTON) 5 MG tablet Take 1/2 tablet (2.5 mg) NOW! 1 tablet 0  . pravastatin (PRAVACHOL) 20 MG tablet TAKE 1 TABLET BY MOUTH EVERY DAY 90 tablet 0  . promethazine (PHENERGAN) 25 MG tablet Take 1 tablet (25 mg total) by mouth every 4 (four) hours as needed for nausea or vomiting. 60 tablet 0  . RHEUMATREX 2.5 MG tablet TAKE 1 TABLET 2 TIMES A WEEK (Patient taking differently: TAKE 1 TABLET 2 TIMES A WEEK (MON AND FRI)) 24 tablet 1  . solifenacin (VESICARE) 5 MG tablet Take 1 tablet (5 mg total) by mouth daily. 90 tablet 2  . traMADol (ULTRAM) 50 MG tablet TAKE 1-2 TABLETS AT BEDTIME AS NEEDED FOR SLEEP 90 tablet 0  . warfarin (COUMADIN) 5 MG tablet TAKE 1-1.5 BY MOUTH DAILY AT 6PM. TAKE 1 TAB DAILY TUES,TH,F,SAT & SUN. TAKE 1& 1/2 TAB ON MON &WED (Patient taking differently: TAKE 1.5 TABS BY MOUTH DAILY AT 6PM. TAKE 1.5 TABS DAILY TUES,TH,F,SAT & SUN. TAKE 2 TABS ON MON & FRI) 60 tablet 0   No current facility-administered medications on file prior to visit.    Allergies  Allergen Reactions  . Celebrex [Celecoxib] Rash  . Detrol [Tolterodine] Hives    Family History  Problem Relation Age of Onset  . Stroke    . Heart disease Mother   . Heart disease Sister     BP 124/72 mmHg  Pulse 103  Temp(Src) 98.5 F (36.9 C)  (Oral)  Resp 16  Ht 5\' 3"  (1.6 m)  Wt 163 lb (73.936 kg)  BMI 28.88 kg/m2  SpO2 96%  Review of Systems Denies LOC.      Objective:   Physical Exam VITAL SIGNS:  See vs page GENERAL: no distress Pulses: dorsalis pedis intact  bilat.   MSK: no deformity of the feet  CV: no leg edema  Skin: no ulcer on the feet. normal color and temp on the feet.  Neuro: sensation is intact to touch on the feet, but decreased from normal.  Ext: There is bilateral onychomycosis of the toenails   A1c=11.6%    Assessment & Plan:  DM: glycemic control is worse  Patient is advised the following: Patient Instructions  check your blood sugar twice a day.  vary the time of day when you check, between before the 3 meals, and at bedtime.  also check if you have symptoms of your blood sugar being too high or too low.  please keep a record of the readings and bring it to your next appointment here.  You can write it on any piece of paper.  please call us sooner if your blood sugar goes below 70, or if you have a lot of readings over 200.    On this type of insulin schedule, you should eat meals on a regular schedule.  If a meal is missed or significantly delayed, your blood sugar could go low. Please come back for a follow-up appointment in 6 weeks.     Please increase the NPH to 35 units each morning.  However, if you are going to be active, take just 25 units.

## 2015-06-06 NOTE — Patient Instructions (Addendum)
check your blood sugar twice a day.  vary the time of day when you check, between before the 3 meals, and at bedtime.  also check if you have symptoms of your blood sugar being too high or too low.  please keep a record of the readings and bring it to your next appointment here.  You can write it on any piece of paper.  please call us sooner if your blood sugar goes below 70, or if you have a lot of readings over 200.    On this type of insulin schedule, you should eat meals on a regular schedule.  If a meal is missed or significantly delayed, your blood sugar could go low. Please come back for a follow-up appointment in 6 weeks.     Please increase the NPH to 35 units each morning.  However, if you are going to be active, take just 25 units.

## 2015-06-07 DIAGNOSIS — E139 Other specified diabetes mellitus without complications: Secondary | ICD-10-CM | POA: Diagnosis not present

## 2015-06-07 DIAGNOSIS — I10 Essential (primary) hypertension: Secondary | ICD-10-CM | POA: Diagnosis not present

## 2015-06-07 DIAGNOSIS — I6330 Cerebral infarction due to thrombosis of unspecified cerebral artery: Secondary | ICD-10-CM | POA: Diagnosis not present

## 2015-06-08 ENCOUNTER — Telehealth: Payer: Self-pay | Admitting: Adult Health

## 2015-06-08 ENCOUNTER — Ambulatory Visit (HOSPITAL_COMMUNITY): Payer: Medicaid Other

## 2015-06-08 ENCOUNTER — Other Ambulatory Visit: Payer: Self-pay | Admitting: Family

## 2015-06-08 ENCOUNTER — Ambulatory Visit (HOSPITAL_COMMUNITY): Payer: Commercial Managed Care - HMO

## 2015-06-08 DIAGNOSIS — I10 Essential (primary) hypertension: Secondary | ICD-10-CM | POA: Diagnosis not present

## 2015-06-08 DIAGNOSIS — E139 Other specified diabetes mellitus without complications: Secondary | ICD-10-CM | POA: Diagnosis not present

## 2015-06-08 DIAGNOSIS — I6330 Cerebral infarction due to thrombosis of unspecified cerebral artery: Secondary | ICD-10-CM | POA: Diagnosis not present

## 2015-06-08 NOTE — Telephone Encounter (Signed)
Pt request refill of the following: traMADol (ULTRAM) 50 MG tablet   Phamacy:  [facility]

## 2015-06-09 DIAGNOSIS — E139 Other specified diabetes mellitus without complications: Secondary | ICD-10-CM | POA: Diagnosis not present

## 2015-06-09 DIAGNOSIS — I10 Essential (primary) hypertension: Secondary | ICD-10-CM | POA: Diagnosis not present

## 2015-06-09 DIAGNOSIS — I6330 Cerebral infarction due to thrombosis of unspecified cerebral artery: Secondary | ICD-10-CM | POA: Diagnosis not present

## 2015-06-09 MED ORDER — TRAMADOL HCL 50 MG PO TABS: ORAL_TABLET | ORAL | Status: DC

## 2015-06-09 NOTE — Telephone Encounter (Signed)
Rx called in to pharmacy. 

## 2015-06-10 ENCOUNTER — Ambulatory Visit (HOSPITAL_COMMUNITY): Payer: Medicaid Other

## 2015-06-10 ENCOUNTER — Ambulatory Visit (HOSPITAL_COMMUNITY): Payer: Commercial Managed Care - HMO

## 2015-06-10 DIAGNOSIS — I10 Essential (primary) hypertension: Secondary | ICD-10-CM | POA: Diagnosis not present

## 2015-06-10 DIAGNOSIS — I6330 Cerebral infarction due to thrombosis of unspecified cerebral artery: Secondary | ICD-10-CM | POA: Diagnosis not present

## 2015-06-10 DIAGNOSIS — E139 Other specified diabetes mellitus without complications: Secondary | ICD-10-CM | POA: Diagnosis not present

## 2015-06-11 DIAGNOSIS — I10 Essential (primary) hypertension: Secondary | ICD-10-CM | POA: Diagnosis not present

## 2015-06-11 DIAGNOSIS — E139 Other specified diabetes mellitus without complications: Secondary | ICD-10-CM | POA: Diagnosis not present

## 2015-06-11 DIAGNOSIS — I6330 Cerebral infarction due to thrombosis of unspecified cerebral artery: Secondary | ICD-10-CM | POA: Diagnosis not present

## 2015-06-12 ENCOUNTER — Other Ambulatory Visit: Payer: Self-pay | Admitting: Internal Medicine

## 2015-06-12 DIAGNOSIS — I10 Essential (primary) hypertension: Secondary | ICD-10-CM | POA: Diagnosis not present

## 2015-06-12 DIAGNOSIS — E139 Other specified diabetes mellitus without complications: Secondary | ICD-10-CM | POA: Diagnosis not present

## 2015-06-12 DIAGNOSIS — I6330 Cerebral infarction due to thrombosis of unspecified cerebral artery: Secondary | ICD-10-CM | POA: Diagnosis not present

## 2015-06-13 ENCOUNTER — Ambulatory Visit (HOSPITAL_COMMUNITY): Payer: Medicaid Other

## 2015-06-13 ENCOUNTER — Ambulatory Visit (INDEPENDENT_AMBULATORY_CARE_PROVIDER_SITE_OTHER): Payer: Commercial Managed Care - HMO | Admitting: General Practice

## 2015-06-13 DIAGNOSIS — Z954 Presence of other heart-valve replacement: Secondary | ICD-10-CM | POA: Diagnosis not present

## 2015-06-13 DIAGNOSIS — Z952 Presence of prosthetic heart valve: Secondary | ICD-10-CM

## 2015-06-13 DIAGNOSIS — E139 Other specified diabetes mellitus without complications: Secondary | ICD-10-CM | POA: Diagnosis not present

## 2015-06-13 DIAGNOSIS — I10 Essential (primary) hypertension: Secondary | ICD-10-CM | POA: Diagnosis not present

## 2015-06-13 DIAGNOSIS — I6330 Cerebral infarction due to thrombosis of unspecified cerebral artery: Secondary | ICD-10-CM | POA: Diagnosis not present

## 2015-06-13 LAB — POCT INR: INR: 1.2

## 2015-06-13 NOTE — Progress Notes (Signed)
Pre visit review using our clinic review tool, if applicable. No additional management support is needed unless otherwise documented below in the visit note. 

## 2015-06-14 DIAGNOSIS — I10 Essential (primary) hypertension: Secondary | ICD-10-CM | POA: Diagnosis not present

## 2015-06-14 DIAGNOSIS — E139 Other specified diabetes mellitus without complications: Secondary | ICD-10-CM | POA: Diagnosis not present

## 2015-06-14 DIAGNOSIS — I6330 Cerebral infarction due to thrombosis of unspecified cerebral artery: Secondary | ICD-10-CM | POA: Diagnosis not present

## 2015-06-15 ENCOUNTER — Ambulatory Visit (HOSPITAL_COMMUNITY): Payer: Medicaid Other

## 2015-06-15 DIAGNOSIS — F332 Major depressive disorder, recurrent severe without psychotic features: Secondary | ICD-10-CM | POA: Diagnosis not present

## 2015-06-15 DIAGNOSIS — E139 Other specified diabetes mellitus without complications: Secondary | ICD-10-CM | POA: Diagnosis not present

## 2015-06-15 DIAGNOSIS — I10 Essential (primary) hypertension: Secondary | ICD-10-CM | POA: Diagnosis not present

## 2015-06-15 DIAGNOSIS — I6330 Cerebral infarction due to thrombosis of unspecified cerebral artery: Secondary | ICD-10-CM | POA: Diagnosis not present

## 2015-06-15 NOTE — Telephone Encounter (Signed)
Pt has requested to have #90.  Rx sent in on 7.21.2016 #30.

## 2015-06-16 ENCOUNTER — Ambulatory Visit (INDEPENDENT_AMBULATORY_CARE_PROVIDER_SITE_OTHER): Payer: Commercial Managed Care - HMO | Admitting: Adult Health

## 2015-06-16 ENCOUNTER — Telehealth: Payer: Self-pay | Admitting: Adult Health

## 2015-06-16 VITALS — BP 110/68 | Temp 98.8°F | Ht 63.0 in | Wt 163.4 lb

## 2015-06-16 DIAGNOSIS — Z0000 Encounter for general adult medical examination without abnormal findings: Secondary | ICD-10-CM | POA: Diagnosis not present

## 2015-06-16 DIAGNOSIS — I6330 Cerebral infarction due to thrombosis of unspecified cerebral artery: Secondary | ICD-10-CM | POA: Diagnosis not present

## 2015-06-16 DIAGNOSIS — E785 Hyperlipidemia, unspecified: Secondary | ICD-10-CM | POA: Diagnosis not present

## 2015-06-16 DIAGNOSIS — I10 Essential (primary) hypertension: Secondary | ICD-10-CM

## 2015-06-16 DIAGNOSIS — E139 Other specified diabetes mellitus without complications: Secondary | ICD-10-CM | POA: Diagnosis not present

## 2015-06-16 DIAGNOSIS — G4700 Insomnia, unspecified: Secondary | ICD-10-CM | POA: Diagnosis not present

## 2015-06-16 LAB — CBC WITH DIFFERENTIAL/PLATELET
BASOS ABS: 0.1 10*3/uL (ref 0.0–0.1)
Basophils Relative: 0.9 % (ref 0.0–3.0)
EOS ABS: 0.1 10*3/uL (ref 0.0–0.7)
Eosinophils Relative: 2 % (ref 0.0–5.0)
HCT: 33.1 % — ABNORMAL LOW (ref 36.0–46.0)
Hemoglobin: 10.3 g/dL — ABNORMAL LOW (ref 12.0–15.0)
LYMPHS ABS: 2.6 10*3/uL (ref 0.7–4.0)
LYMPHS PCT: 41.4 % (ref 12.0–46.0)
MCHC: 31 g/dL (ref 30.0–36.0)
MCV: 74.7 fl — AB (ref 78.0–100.0)
MONO ABS: 0.4 10*3/uL (ref 0.1–1.0)
MONOS PCT: 5.7 % (ref 3.0–12.0)
NEUTROS ABS: 3.2 10*3/uL (ref 1.4–7.7)
NEUTROS PCT: 50 % (ref 43.0–77.0)
PLATELETS: 458 10*3/uL — AB (ref 150.0–400.0)
RBC: 4.43 Mil/uL (ref 3.87–5.11)
RDW: 20.3 % — ABNORMAL HIGH (ref 11.5–15.5)
WBC: 6.3 10*3/uL (ref 4.0–10.5)

## 2015-06-16 LAB — POCT URINALYSIS DIPSTICK
Bilirubin, UA: NEGATIVE
Blood, UA: NEGATIVE
Glucose, UA: NEGATIVE
Ketones, UA: NEGATIVE
Nitrite, UA: NEGATIVE
PH UA: 7.5
Protein, UA: NEGATIVE
SPEC GRAV UA: 1.01
UROBILINOGEN UA: 0.2

## 2015-06-16 LAB — LIPID PANEL
CHOLESTEROL: 191 mg/dL (ref 0–200)
HDL: 67.1 mg/dL (ref >39.00)
LDL CALC: 105 mg/dL — AB (ref 0–99)
NonHDL: 124.35
Total CHOL/HDL Ratio: 3
Triglycerides: 96 mg/dL (ref 0.0–149.0)
VLDL: 19.2 mg/dL (ref 0.0–40.0)

## 2015-06-16 LAB — HEPATIC FUNCTION PANEL
ALT: 12 U/L (ref 0–35)
AST: 15 U/L (ref 0–37)
Albumin: 3.9 g/dL (ref 3.5–5.2)
Alkaline Phosphatase: 89 U/L (ref 39–117)
BILIRUBIN TOTAL: 0.3 mg/dL (ref 0.2–1.2)
Bilirubin, Direct: 0.1 mg/dL (ref 0.0–0.3)
Total Protein: 7.4 g/dL (ref 6.0–8.3)

## 2015-06-16 LAB — TSH: TSH: 0.79 u[IU]/mL (ref 0.35–4.50)

## 2015-06-16 MED ORDER — TRAZODONE HCL 50 MG PO TABS: 25.0000 mg | ORAL_TABLET | Freq: Every evening | ORAL | Status: DC | PRN

## 2015-06-16 NOTE — Telephone Encounter (Signed)
Spoke to patient and informed her on her labs. Her cholesterol, although still WNL has had a significant bump over the last six months.

## 2015-06-16 NOTE — Patient Instructions (Addendum)
  Madison [name] , Thank you for taking time to come for your Medicare Wellness Visit. I appreciate your ongoing commitment to your health goals. Please review the following plan we discussed and let me know if I can assist you in the future.   These are the goals we discussed: Goals    . Decrease the likelihood of falling    . Increase physical activity    . Reduce sugar intake to X grams per day       This is a list of the screening recommended for you and due dates:  Health Maintenance  Topic Date Due  . HIV Screening  12/21/1975  . Pap Smear  12/20/1978  . Colon Cancer Screening  12/20/2010  . Flu Shot  06/20/2015  . Urine Protein Check  10/22/2015  . Hemoglobin A1C  12/07/2015  . Mammogram  03/24/2016  . Eye exam for diabetics  04/27/2016  . Complete foot exam   06/05/2016  . Pneumococcal vaccine (2) 11/19/2016  . Tetanus Vaccine  08/07/2023    Please discontinue to the Tramadol at night for sleep. I have sent a prescription to the pharmacy for Trazadone, this will be a better medication for your insomnia.

## 2015-06-16 NOTE — Progress Notes (Signed)
Madison Conch, MD Phone: [phone]  Subjective:  Patient presents today for their annual wellness visit.   Diabetes:  She is followed by Endocrinology for this. Her blood sugar was 35 this morning and had to drink orange juice. This has been the only low she has had since seeing Dr. Everardo All on July 18th, the highest has been 211. Her last A1c is  Lab Results  Component Value Date   HGBA1C 11.6 06/06/2015      Hypertension - Feels as thought it is well controlled on her current medication regimen. She gets out of bed slowly and gets up slowly when moving from chair or couch due to occasional dizziness.  Courtney reports that she is eating healthier and continues to go to PT/OT for gait exercises. She no longer needs to use her cane. She is feeling " much healthier" and states " I am taking care of myself more, I am in the process of moving to a small place that is only one level. " She continues to go to PT/OT twice a week and her home health aid is only coming once a week instead of every day.   Preventive Screening-Counseling & Management  Smoking Status: Never Smoker Second Hand Smoking status:No smokers in home  Risk Factors Regular exercise: PT/OT two times a week Diet: Is working on it, she is counting carbs and tries to follow up a diabetic diet.  Fall Risk: Yes  Cardiac risk factors:  advanced age (older than 71 for men, 58 for women) No Hyperlipidemia yes Diabetes.  Family History: Yes, mother and sister have heart disease  Depression Screen None. PHQ2 0   Activities of Daily Living Independent ADLs and IADLs   Hearing Difficulties: patient declines  Cognitive Testing No reported trouble.   Normal 3 word recall  List the Names of Other Physician/Practitioners you currently use: 1.See snapshot  Immunization History  Administered Date(s) Administered  . Influenza,inj,Quad PF,36+ Mos 08/06/2013, 10/02/2014  . Pneumococcal Conjugate-13  03/08/2015  . Pneumococcal Polysaccharide-23 11/20/2011  . Tdap 08/06/2013   Required Immunizations needed today : None  Screening tests- Needs colonoscopy. Does not want pap due to pain and bleeding from last time.  Health Maintenance Due  Topic Date Due  . HIV Screening  12/21/1975  . PAP SMEAR  12/20/1978  . COLONOSCOPY  12/20/2010    ROS- No pertinent positives discovered in course of AWV  The following were reviewed and entered/updated in epic: Past Medical History  Diagnosis Date  . CAD (coronary artery disease)   . Obesity   . Hypercholesteremia   . HTN (hypertension)   . Dyslipidemia   . Aortic stenosis   . Thrombophlebitis   . Heart murmur   . Myocardial infarction 12/2014  . Pneumonia 11/2014; 12/2014  . Diabetes dx'd 1982    "went straight to insulin"  . GERD (gastroesophageal reflux disease)   . 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 Rheumatoid arthritis(714.0)     "hands" (01/19/2015)  . Anxiety   . Depression   . Allergy   . CHF (congestive heart failure)    Patient Active Problem List   Diagnosis Date Noted  . Normocytic anemia 01/31/2015  . Arterial hypotension   . Other specified fever   . Diabetic ketoacidosis with coma associated with other specified diabetes mellitus   . Fever   . Suspected Sepsis 01/01/2015  . Acute on chronic  combined systolic and diastolic CHF (congestive heart failure)   . Essential hypertension   . Acute kidney injury   . Elevated troponin   . Diabetes type 1, uncontrolled   . Demand ischemia   . Acute on chronic systolic CHF (congestive heart failure)   . S/P aortic valve replacement   . Hypokalemia   . Acute renal failure syndrome   . NSTEMI (non-ST elevated myocardial infarction) 12/08/2014  . Blood poisoning   . Supratherapeutic INR   . Severe sepsis with acute organ dysfunction 12/03/2014  . Lactic acidosis   . Spastic hemiplegia affecting  nondominant side 11/29/2014  . Dysphagia, pharyngoesophageal phase 10/04/2014  . Sinus tachycardia 09/30/2014  . Diabetic ketoacidosis without coma associated with type 1 diabetes mellitus   . Depression 04/29/2014  . AKI (acute kidney injury) 03/12/2014  . CVA (cerebral infarction) 03/12/2014  . Acute respiratory failure 03/12/2014  . DKA (diabetic ketoacidoses) 03/08/2014  . Severe sepsis(995.92) 03/08/2014  . Metabolic acidosis 03/08/2014  . 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 Chronic anticoagulation 07/28/2013  . S/P AVR 07/28/2013  . Long term (current) use of anticoagulants 07/28/2013  . CAD (coronary artery disease)   . Obesity   . HTN (hypertension)   . Aortic stenosis   . Thrombophlebitis   . Hyperlipidemia   . Rheumatoid arthritis    Past Surgical History  Procedure Laterality Date  . Cesarean section  1995  . Foot fracture surgery    . Knee arthroscopy Right   . Neuroplasty / transposition median nerve at carpal tunnel    . Left heart catheterization with coronary angiogram N/A 12/08/2014    Procedure: LEFT HEART CATHETERIZATION WITH CORONARY ANGIOGRAM;  Surgeon: Iran Ouch, MD;  Location: [facility] CATH LAB;  Service: Cardiovascular;  Laterality: N/A;  . Percutaneous coronary stent intervention (pci-s) N/A 12/09/2014    Procedure: PERCUTANEOUS CORONARY STENT INTERVENTION (PCI-S);  Surgeon: [name], MD;  Location: [facility] CATH LAB;  Service: Cardiovascular;  Laterality: N/A;  . Cardiac catheterization  12/08/2014  . Coronary angioplasty with stent placement  12/09/2014  . Aortic valve replacement (avr)/coronary artery bypass grafting (cabg)  "2014"    [name] 03/08/2014  . Fracture surgery    . Tubal ligation  1995  . Cataract extraction Left     Family History  Problem Relation Age of Onset  . Stroke    . Heart disease Mother   . Heart disease Sister     Medications- reviewed and updated Current Outpatient Prescriptions    Medication Sig Dispense Refill  . aspirin 81 MG tablet Take 1 tablet (81 mg total) by mouth daily. 30 tablet   . atorvastatin (LIPITOR) 20 MG tablet Take 20 mg by mouth daily.    . baclofen (LIORESAL) 10 MG tablet Take 1 tablet (10 mg total) by mouth 2 (two) times daily. 60 tablet 0  . clonazePAM (KLONOPIN) 0.5 MG tablet Take 0.5 mg by mouth 2 (two) times daily.    . clopidogrel (PLAVIX) 75 MG tablet Take 1 tablet (75 mg total) by mouth daily with breakfast. 30 tablet 2  . escitalopram (LEXAPRO) 20 MG tablet Take 20 mg by mouth daily.    . feeding supplement, GLUCERNA SHAKE, (GLUCERNA SHAKE) LIQD Take 237 mLs by mouth 2 (two) times daily between meals. 60 Can 0  . glucagon (GLUCAGON EMERGENCY) 1 MG injection Inject 1 mg into the vein once as needed. 1 each 5  . glucose blood (ACCU-CHEK AVIVA) test strip  Use to check blood sugar 5 times per day Dx Code E10.10 And lancets 5/day 150 each 11  . Insulin NPH, Human,, Isophane, (HUMULIN N) 100 UNIT/ML Kiwkpen Inject 35 Units into the skin every morning. And pen needles, 1/day 15 mL 11  . lisinopril (PRINIVIL,ZESTRIL) 2.5 MG tablet TAKE 1 TABLET BY MOUTH EVERY DAY 30 tablet 5  . metoprolol tartrate (LOPRESSOR) 25 MG tablet Take 0.5 tablets (12.5 mg total) by mouth 2 (two) times daily. (Patient taking differently: Take 25 mg by mouth daily. ) 60 tablet 0  . omeprazole (PRILOSEC) 40 MG capsule TAKE 1 CAPSULE (40 MG TOTAL) BY MOUTH DAILY. 30 capsule 6  . pravastatin (PRAVACHOL) 20 MG tablet TAKE 1 TABLET BY MOUTH EVERY DAY 90 tablet 0  . promethazine (PHENERGAN) 25 MG tablet Take 1 tablet (25 mg total) by mouth every 4 (four) hours as needed for nausea or vomiting. 60 tablet 0  . RHEUMATREX 2.5 MG tablet TAKE 1 TABLET 2 TIMES A WEEK (Patient taking differently: TAKE 1 TABLET 2 TIMES A WEEK (MON AND FRI)) 24 tablet 1  . solifenacin (VESICARE) 5 MG tablet Take 1 tablet (5 mg total) by mouth daily. 90 tablet 2  . traMADol (ULTRAM) 50 MG tablet TAKE 1-2 TABLETS  AT BEDTIME AS NEEDED FOR SLEEP 30 tablet 0  . warfarin (COUMADIN) 5 MG tablet TAKE 1-1.5 BY MOUTH DAILY AT 6PM. TAKE 1 TAB DAILY TUES,TH,F,SAT & SUN. TAKE 1& 1/2 TAB ON MON &WED (Patient taking differently: TAKE 1.5 TABS BY MOUTH DAILY AT 6PM. TAKE 1.5 TABS DAILY TUES,TH,F,SAT & SUN. TAKE 2 TABS ON MON & FRI) 60 tablet 0   No current facility-administered medications for this visit.    Allergies-reviewed and updated Allergies  Allergen Reactions  . Celebrex [Celecoxib] Rash  . Detrol [Tolterodine] Hives    History   Social History  . Marital Status: Divorced    Spouse Name: N/A  . Number of Children: N/A  . Years of Education: N/A   Social History Main Topics  . Smoking status: Never Smoker   . Smokeless tobacco: Never Used  . Alcohol Use: No  . Drug Use: No  . Sexual Activity: Not on file   Other Topics Concern  . Not on file   Social History Narrative   Divroced and lives at home.    Has two sons who live in [place] at this time. (21&8 y/o).    Has a cat    Walks a lot.       She is having great success with her diet       Working with exercises that were given to her by PT/OT.     Objective: BP 110/68 mmHg  Temp(Src) 98.8 F (37.1 C) (Oral)  Ht 5\' 3"  (1.6 m)  Wt 163 lb 6.4 oz (74.118 kg)  BMI 28.95 kg/m2 GENERAL: vitals reviewed and listed above, alert, oriented, appears well hydrated and in no acute distress.  HEENT: atraumatic, conjunttiva clear, no obvious abnormalities on inspection of external nose and ears  NECK: no obvious masses on inspection  LUNGS: clear to auscultation bilaterally, no wheezes, rales or rhonchi, good air movement  CV: HRRR, no peripheral edema, no carotid bruit  MS: moves all extremities without noticeable abnormality. Has slow but steady gait.   Breast: Deferred by patient.   PSYCH: pleasant and cooperative, no obvious depression or anxiety   Assessment/Plan: 1. Essential hypertension - CBC with Differential/Platelet -  Hepatic function panel - Lipid panel -  POCT urinalysis dipstick - TSH - Continue to monitor blood pressure at home.  - Consider decreasing dose of Metorprolol or d/c lisinopril if blood pressure continues to be low.  - Will follow up on labs  2. Hyperlipidemia - CBC with Differential/Platelet - Hepatic function panel - Lipid panel - POCT urinalysis dipstick - TSH  3. Insomnia - D/C Tramadol for sleep - traZODone (DESYREL) 50 MG tablet; Take 0.5-1 tablets (25-50 mg total) by mouth at bedtime as needed for sleep.  Dispense: 30 tablet; Refill: 3  4. Medicare annual wellness visit, subsequent - Continue to eat healthy and go to PT/OT - Ambulatory referral to Gastroenterology  These are the goals we discussed: Goals    . Decrease the likelihood of falling    . Increase physical activity    . Reduce sugar intake to X grams per day       This is a list of the screening recommended for you and due dates:  Health Maintenance  Topic Date Due  . HIV Screening  12/21/1975  . Pap Smear  12/20/1978  . Colon Cancer Screening  12/20/2010  . Flu Shot  06/20/2015  . Urine Protein Check  10/22/2015  . Hemoglobin A1C  12/07/2015  . Mammogram  03/24/2016  . Eye exam for diabetics  04/27/2016  . Complete foot exam   06/05/2016  . Pneumococcal vaccine (2) 11/19/2016  . Tetanus Vaccine  08/07/2023     Return precautions advised.   [name], RN

## 2015-06-16 NOTE — Progress Notes (Signed)
Pre visit review using our clinic review tool, if applicable. No additional management support is needed unless otherwise documented below in the visit note. 

## 2015-06-17 ENCOUNTER — Ambulatory Visit (HOSPITAL_COMMUNITY): Payer: Medicaid Other

## 2015-06-17 DIAGNOSIS — I10 Essential (primary) hypertension: Secondary | ICD-10-CM | POA: Diagnosis not present

## 2015-06-17 DIAGNOSIS — I6330 Cerebral infarction due to thrombosis of unspecified cerebral artery: Secondary | ICD-10-CM | POA: Diagnosis not present

## 2015-06-17 DIAGNOSIS — E139 Other specified diabetes mellitus without complications: Secondary | ICD-10-CM | POA: Diagnosis not present

## 2015-06-18 DIAGNOSIS — I10 Essential (primary) hypertension: Secondary | ICD-10-CM | POA: Diagnosis not present

## 2015-06-18 DIAGNOSIS — I6330 Cerebral infarction due to thrombosis of unspecified cerebral artery: Secondary | ICD-10-CM | POA: Diagnosis not present

## 2015-06-18 DIAGNOSIS — E139 Other specified diabetes mellitus without complications: Secondary | ICD-10-CM | POA: Diagnosis not present

## 2015-06-19 DIAGNOSIS — I6330 Cerebral infarction due to thrombosis of unspecified cerebral artery: Secondary | ICD-10-CM | POA: Diagnosis not present

## 2015-06-19 DIAGNOSIS — E139 Other specified diabetes mellitus without complications: Secondary | ICD-10-CM | POA: Diagnosis not present

## 2015-06-19 DIAGNOSIS — I10 Essential (primary) hypertension: Secondary | ICD-10-CM | POA: Diagnosis not present

## 2015-06-20 ENCOUNTER — Other Ambulatory Visit: Payer: Self-pay | Admitting: Internal Medicine

## 2015-06-20 ENCOUNTER — Ambulatory Visit (HOSPITAL_COMMUNITY): Payer: Medicaid Other

## 2015-06-20 ENCOUNTER — Ambulatory Visit (INDEPENDENT_AMBULATORY_CARE_PROVIDER_SITE_OTHER): Payer: Commercial Managed Care - HMO | Admitting: General Practice

## 2015-06-20 DIAGNOSIS — E139 Other specified diabetes mellitus without complications: Secondary | ICD-10-CM | POA: Diagnosis not present

## 2015-06-20 DIAGNOSIS — I6330 Cerebral infarction due to thrombosis of unspecified cerebral artery: Secondary | ICD-10-CM | POA: Diagnosis not present

## 2015-06-20 DIAGNOSIS — I10 Essential (primary) hypertension: Secondary | ICD-10-CM | POA: Diagnosis not present

## 2015-06-20 DIAGNOSIS — Z952 Presence of prosthetic heart valve: Secondary | ICD-10-CM

## 2015-06-20 DIAGNOSIS — Z954 Presence of other heart-valve replacement: Secondary | ICD-10-CM | POA: Diagnosis not present

## 2015-06-20 LAB — POCT INR: INR: 2.4

## 2015-06-20 NOTE — Progress Notes (Signed)
Pre visit review using our clinic review tool, if applicable. No additional management support is needed unless otherwise documented below in the visit note. 

## 2015-06-21 DIAGNOSIS — E139 Other specified diabetes mellitus without complications: Secondary | ICD-10-CM | POA: Diagnosis not present

## 2015-06-21 DIAGNOSIS — I10 Essential (primary) hypertension: Secondary | ICD-10-CM | POA: Diagnosis not present

## 2015-06-21 DIAGNOSIS — I6330 Cerebral infarction due to thrombosis of unspecified cerebral artery: Secondary | ICD-10-CM | POA: Diagnosis not present

## 2015-06-22 ENCOUNTER — Ambulatory Visit (HOSPITAL_COMMUNITY): Payer: Medicaid Other

## 2015-06-22 DIAGNOSIS — I6330 Cerebral infarction due to thrombosis of unspecified cerebral artery: Secondary | ICD-10-CM | POA: Diagnosis not present

## 2015-06-22 DIAGNOSIS — E139 Other specified diabetes mellitus without complications: Secondary | ICD-10-CM | POA: Diagnosis not present

## 2015-06-22 DIAGNOSIS — I10 Essential (primary) hypertension: Secondary | ICD-10-CM | POA: Diagnosis not present

## 2015-06-23 DIAGNOSIS — I10 Essential (primary) hypertension: Secondary | ICD-10-CM | POA: Diagnosis not present

## 2015-06-23 DIAGNOSIS — E139 Other specified diabetes mellitus without complications: Secondary | ICD-10-CM | POA: Diagnosis not present

## 2015-06-23 DIAGNOSIS — I6330 Cerebral infarction due to thrombosis of unspecified cerebral artery: Secondary | ICD-10-CM | POA: Diagnosis not present

## 2015-06-24 ENCOUNTER — Ambulatory Visit (HOSPITAL_COMMUNITY): Payer: Medicaid Other

## 2015-06-24 DIAGNOSIS — E139 Other specified diabetes mellitus without complications: Secondary | ICD-10-CM | POA: Diagnosis not present

## 2015-06-24 DIAGNOSIS — I6330 Cerebral infarction due to thrombosis of unspecified cerebral artery: Secondary | ICD-10-CM | POA: Diagnosis not present

## 2015-06-24 DIAGNOSIS — I10 Essential (primary) hypertension: Secondary | ICD-10-CM | POA: Diagnosis not present

## 2015-06-25 DIAGNOSIS — I10 Essential (primary) hypertension: Secondary | ICD-10-CM | POA: Diagnosis not present

## 2015-06-25 DIAGNOSIS — E139 Other specified diabetes mellitus without complications: Secondary | ICD-10-CM | POA: Diagnosis not present

## 2015-06-25 DIAGNOSIS — I6330 Cerebral infarction due to thrombosis of unspecified cerebral artery: Secondary | ICD-10-CM | POA: Diagnosis not present

## 2015-06-26 DIAGNOSIS — E139 Other specified diabetes mellitus without complications: Secondary | ICD-10-CM | POA: Diagnosis not present

## 2015-06-26 DIAGNOSIS — I6330 Cerebral infarction due to thrombosis of unspecified cerebral artery: Secondary | ICD-10-CM | POA: Diagnosis not present

## 2015-06-26 DIAGNOSIS — I10 Essential (primary) hypertension: Secondary | ICD-10-CM | POA: Diagnosis not present

## 2015-06-27 ENCOUNTER — Telehealth: Payer: Self-pay | Admitting: Endocrinology

## 2015-06-27 ENCOUNTER — Other Ambulatory Visit: Payer: Self-pay | Admitting: Adult Health

## 2015-06-27 ENCOUNTER — Ambulatory Visit (HOSPITAL_COMMUNITY): Payer: Medicaid Other

## 2015-06-27 DIAGNOSIS — I10 Essential (primary) hypertension: Secondary | ICD-10-CM | POA: Diagnosis not present

## 2015-06-27 DIAGNOSIS — E139 Other specified diabetes mellitus without complications: Secondary | ICD-10-CM | POA: Diagnosis not present

## 2015-06-27 DIAGNOSIS — I6330 Cerebral infarction due to thrombosis of unspecified cerebral artery: Secondary | ICD-10-CM | POA: Diagnosis not present

## 2015-06-27 NOTE — Telephone Encounter (Signed)
Team health note dated 06/26/15 at 8:18 PM Initial Comment Questions about her medication. Declined nurse triage. General Information Type Message Only

## 2015-06-27 NOTE — Telephone Encounter (Signed)
Please call back to clarify the dosing of insulin with the pt

## 2015-06-27 NOTE — Telephone Encounter (Signed)
I tried to contact the pt. Will try again at a later time.  

## 2015-06-27 NOTE — Telephone Encounter (Signed)
Noted  

## 2015-06-28 DIAGNOSIS — E139 Other specified diabetes mellitus without complications: Secondary | ICD-10-CM | POA: Diagnosis not present

## 2015-06-28 DIAGNOSIS — I6330 Cerebral infarction due to thrombosis of unspecified cerebral artery: Secondary | ICD-10-CM | POA: Diagnosis not present

## 2015-06-28 DIAGNOSIS — I10 Essential (primary) hypertension: Secondary | ICD-10-CM | POA: Diagnosis not present

## 2015-06-28 NOTE — Telephone Encounter (Signed)
I tried to contact the pt. Will try again at a later time.  

## 2015-06-29 ENCOUNTER — Ambulatory Visit (HOSPITAL_COMMUNITY): Payer: Medicaid Other

## 2015-06-29 DIAGNOSIS — E139 Other specified diabetes mellitus without complications: Secondary | ICD-10-CM | POA: Diagnosis not present

## 2015-06-29 DIAGNOSIS — I10 Essential (primary) hypertension: Secondary | ICD-10-CM | POA: Diagnosis not present

## 2015-06-29 DIAGNOSIS — I6330 Cerebral infarction due to thrombosis of unspecified cerebral artery: Secondary | ICD-10-CM | POA: Diagnosis not present

## 2015-06-29 NOTE — Telephone Encounter (Signed)
Pt advised to take 35 units of the Novolin 70/30 in the morning and 25 units in the morning if she is going to be active. Pt voiced understanding.

## 2015-06-30 DIAGNOSIS — E139 Other specified diabetes mellitus without complications: Secondary | ICD-10-CM | POA: Diagnosis not present

## 2015-06-30 DIAGNOSIS — I10 Essential (primary) hypertension: Secondary | ICD-10-CM | POA: Diagnosis not present

## 2015-06-30 DIAGNOSIS — I6330 Cerebral infarction due to thrombosis of unspecified cerebral artery: Secondary | ICD-10-CM | POA: Diagnosis not present

## 2015-07-01 ENCOUNTER — Emergency Department (HOSPITAL_COMMUNITY): Payer: Commercial Managed Care - HMO

## 2015-07-01 ENCOUNTER — Inpatient Hospital Stay (HOSPITAL_COMMUNITY): Payer: Commercial Managed Care - HMO

## 2015-07-01 ENCOUNTER — Encounter (HOSPITAL_COMMUNITY): Payer: Self-pay | Admitting: Emergency Medicine

## 2015-07-01 ENCOUNTER — Inpatient Hospital Stay (HOSPITAL_COMMUNITY)
Admission: EM | Admit: 2015-07-01 | Discharge: 2015-07-09 | DRG: 492 | Disposition: A | Discharge status: Skilled Nursing Facility | Payer: Commercial Managed Care - HMO | Attending: Internal Medicine | Admitting: Internal Medicine

## 2015-07-01 ENCOUNTER — Ambulatory Visit (HOSPITAL_COMMUNITY): Payer: Medicaid Other

## 2015-07-01 ENCOUNTER — Other Ambulatory Visit: Payer: Self-pay

## 2015-07-01 DIAGNOSIS — Z794 Long term (current) use of insulin: Secondary | ICD-10-CM | POA: Diagnosis not present

## 2015-07-01 DIAGNOSIS — I10 Essential (primary) hypertension: Secondary | ICD-10-CM | POA: Diagnosis present

## 2015-07-01 DIAGNOSIS — Z7901 Long term (current) use of anticoagulants: Secondary | ICD-10-CM

## 2015-07-01 DIAGNOSIS — I69334 Monoplegia of upper limb following cerebral infarction affecting left non-dominant side: Secondary | ICD-10-CM | POA: Diagnosis not present

## 2015-07-01 DIAGNOSIS — E1310 Other specified diabetes mellitus with ketoacidosis without coma: Secondary | ICD-10-CM | POA: Diagnosis present

## 2015-07-01 DIAGNOSIS — Z955 Presence of coronary angioplasty implant and graft: Secondary | ICD-10-CM

## 2015-07-01 DIAGNOSIS — Z7982 Long term (current) use of aspirin: Secondary | ICD-10-CM | POA: Diagnosis not present

## 2015-07-01 DIAGNOSIS — Y9301 Activity, walking, marching and hiking: Secondary | ICD-10-CM | POA: Diagnosis not present

## 2015-07-01 DIAGNOSIS — I69320 Aphasia following cerebral infarction: Secondary | ICD-10-CM | POA: Diagnosis not present

## 2015-07-01 DIAGNOSIS — E872 Acidosis: Secondary | ICD-10-CM | POA: Diagnosis not present

## 2015-07-01 DIAGNOSIS — S82892D Other fracture of left lower leg, subsequent encounter for closed fracture with routine healing: Secondary | ICD-10-CM | POA: Diagnosis not present

## 2015-07-01 DIAGNOSIS — I509 Heart failure, unspecified: Secondary | ICD-10-CM | POA: Diagnosis not present

## 2015-07-01 DIAGNOSIS — Z7902 Long term (current) use of antithrombotics/antiplatelets: Secondary | ICD-10-CM

## 2015-07-01 DIAGNOSIS — F329 Major depressive disorder, single episode, unspecified: Secondary | ICD-10-CM | POA: Diagnosis present

## 2015-07-01 DIAGNOSIS — N183 Chronic kidney disease, stage 3 (moderate): Secondary | ICD-10-CM | POA: Diagnosis not present

## 2015-07-01 DIAGNOSIS — Z951 Presence of aortocoronary bypass graft: Secondary | ICD-10-CM

## 2015-07-01 DIAGNOSIS — Z952 Presence of prosthetic heart valve: Secondary | ICD-10-CM | POA: Diagnosis not present

## 2015-07-01 DIAGNOSIS — I129 Hypertensive chronic kidney disease with stage 1 through stage 4 chronic kidney disease, or unspecified chronic kidney disease: Secondary | ICD-10-CM | POA: Diagnosis present

## 2015-07-01 DIAGNOSIS — W010XXA Fall on same level from slipping, tripping and stumbling without subsequent striking against object, initial encounter: Secondary | ICD-10-CM | POA: Diagnosis present

## 2015-07-01 DIAGNOSIS — S82892A Other fracture of left lower leg, initial encounter for closed fracture: Secondary | ICD-10-CM | POA: Diagnosis not present

## 2015-07-01 DIAGNOSIS — Z954 Presence of other heart-valve replacement: Secondary | ICD-10-CM | POA: Diagnosis not present

## 2015-07-01 DIAGNOSIS — F419 Anxiety disorder, unspecified: Secondary | ICD-10-CM | POA: Diagnosis present

## 2015-07-01 DIAGNOSIS — E1110 Type 2 diabetes mellitus with ketoacidosis without coma: Secondary | ICD-10-CM | POA: Diagnosis present

## 2015-07-01 DIAGNOSIS — E0810 Diabetes mellitus due to underlying condition with ketoacidosis without coma: Secondary | ICD-10-CM

## 2015-07-01 DIAGNOSIS — G8194 Hemiplegia, unspecified affecting left nondominant side: Secondary | ICD-10-CM | POA: Diagnosis not present

## 2015-07-01 DIAGNOSIS — S82852A Displaced trimalleolar fracture of left lower leg, initial encounter for closed fracture: Secondary | ICD-10-CM | POA: Diagnosis present

## 2015-07-01 DIAGNOSIS — N179 Acute kidney failure, unspecified: Secondary | ICD-10-CM

## 2015-07-01 DIAGNOSIS — I2510 Atherosclerotic heart disease of native coronary artery without angina pectoris: Secondary | ICD-10-CM | POA: Diagnosis not present

## 2015-07-01 DIAGNOSIS — E875 Hyperkalemia: Secondary | ICD-10-CM | POA: Insufficient documentation

## 2015-07-01 DIAGNOSIS — M069 Rheumatoid arthritis, unspecified: Secondary | ICD-10-CM | POA: Diagnosis not present

## 2015-07-01 DIAGNOSIS — R1314 Dysphagia, pharyngoesophageal phase: Secondary | ICD-10-CM | POA: Diagnosis present

## 2015-07-01 DIAGNOSIS — M6281 Muscle weakness (generalized): Secondary | ICD-10-CM | POA: Diagnosis not present

## 2015-07-01 DIAGNOSIS — S8252XD Displaced fracture of medial malleolus of left tibia, subsequent encounter for closed fracture with routine healing: Secondary | ICD-10-CM | POA: Diagnosis not present

## 2015-07-01 DIAGNOSIS — E785 Hyperlipidemia, unspecified: Secondary | ICD-10-CM | POA: Diagnosis present

## 2015-07-01 DIAGNOSIS — K219 Gastro-esophageal reflux disease without esophagitis: Secondary | ICD-10-CM | POA: Diagnosis not present

## 2015-07-01 DIAGNOSIS — T148XXA Other injury of unspecified body region, initial encounter: Secondary | ICD-10-CM

## 2015-07-01 DIAGNOSIS — G8110 Spastic hemiplegia affecting unspecified side: Secondary | ICD-10-CM | POA: Diagnosis present

## 2015-07-01 DIAGNOSIS — Z01818 Encounter for other preprocedural examination: Secondary | ICD-10-CM | POA: Diagnosis not present

## 2015-07-01 DIAGNOSIS — Z01810 Encounter for preprocedural cardiovascular examination: Secondary | ICD-10-CM | POA: Diagnosis not present

## 2015-07-01 DIAGNOSIS — Z4789 Encounter for other orthopedic aftercare: Secondary | ICD-10-CM | POA: Diagnosis not present

## 2015-07-01 DIAGNOSIS — Z419 Encounter for procedure for purposes other than remedying health state, unspecified: Secondary | ICD-10-CM

## 2015-07-01 DIAGNOSIS — E1169 Type 2 diabetes mellitus with other specified complication: Secondary | ICD-10-CM | POA: Diagnosis not present

## 2015-07-01 DIAGNOSIS — R791 Abnormal coagulation profile: Secondary | ICD-10-CM | POA: Diagnosis present

## 2015-07-01 DIAGNOSIS — S82832D Other fracture of upper and lower end of left fibula, subsequent encounter for closed fracture with routine healing: Secondary | ICD-10-CM | POA: Diagnosis not present

## 2015-07-01 DIAGNOSIS — R2689 Other abnormalities of gait and mobility: Secondary | ICD-10-CM | POA: Diagnosis not present

## 2015-07-01 DIAGNOSIS — M25572 Pain in left ankle and joints of left foot: Secondary | ICD-10-CM | POA: Diagnosis present

## 2015-07-01 DIAGNOSIS — Z79899 Other long term (current) drug therapy: Secondary | ICD-10-CM

## 2015-07-01 DIAGNOSIS — Z9181 History of falling: Secondary | ICD-10-CM | POA: Diagnosis not present

## 2015-07-01 DIAGNOSIS — Y92009 Unspecified place in unspecified non-institutional (private) residence as the place of occurrence of the external cause: Secondary | ICD-10-CM

## 2015-07-01 DIAGNOSIS — G4700 Insomnia, unspecified: Secondary | ICD-10-CM

## 2015-07-01 DIAGNOSIS — M21272 Flexion deformity, left ankle and toes: Secondary | ICD-10-CM | POA: Diagnosis not present

## 2015-07-01 DIAGNOSIS — E119 Type 2 diabetes mellitus without complications: Secondary | ICD-10-CM | POA: Diagnosis not present

## 2015-07-01 DIAGNOSIS — T148 Other injury of unspecified body region: Secondary | ICD-10-CM | POA: Diagnosis not present

## 2015-07-01 DIAGNOSIS — M79605 Pain in left leg: Secondary | ICD-10-CM | POA: Diagnosis not present

## 2015-07-01 DIAGNOSIS — I252 Old myocardial infarction: Secondary | ICD-10-CM | POA: Diagnosis not present

## 2015-07-01 LAB — GLUCOSE, CAPILLARY
GLUCOSE-CAPILLARY: 533 mg/dL — AB (ref 65–99)
GLUCOSE-CAPILLARY: 412 mg/dL — AB (ref 65–99)
GLUCOSE-CAPILLARY: 253 mg/dL — AB (ref 65–99)
GLUCOSE-CAPILLARY: 204 mg/dL — AB (ref 65–99)
GLUCOSE-CAPILLARY: 108 mg/dL — AB (ref 65–99)
Glucose-Capillary: 554 mg/dL (ref 65–99)
Glucose-Capillary: 321 mg/dL — ABNORMAL HIGH (ref 65–99)
Glucose-Capillary: 276 mg/dL — ABNORMAL HIGH (ref 65–99)
Glucose-Capillary: 117 mg/dL — ABNORMAL HIGH (ref 65–99)

## 2015-07-01 LAB — CBC WITH DIFFERENTIAL/PLATELET
BASOS ABS: 0 10*3/uL (ref 0.0–0.1)
Basophils Relative: 0 % (ref 0–1)
EOS ABS: 0 10*3/uL (ref 0.0–0.7)
Eosinophils Relative: 0 % (ref 0–5)
HCT: 30.4 % — ABNORMAL LOW (ref 36.0–46.0)
HEMOGLOBIN: 9.5 g/dL — AB (ref 12.0–15.0)
LYMPHS ABS: 1.1 10*3/uL (ref 0.7–4.0)
Lymphocytes Relative: 9 % — ABNORMAL LOW (ref 12–46)
MCH: 23.4 pg — ABNORMAL LOW (ref 26.0–34.0)
MCHC: 31.3 g/dL (ref 30.0–36.0)
MCV: 74.9 fL — ABNORMAL LOW (ref 78.0–100.0)
MONOS PCT: 3 % (ref 3–12)
Monocytes Absolute: 0.4 10*3/uL (ref 0.1–1.0)
NEUTROS ABS: 10.7 10*3/uL — AB (ref 1.7–7.7)
Neutrophils Relative %: 88 % — ABNORMAL HIGH (ref 43–77)
PLATELETS: 234 10*3/uL (ref 150–400)
RBC: 4.06 MIL/uL (ref 3.87–5.11)
RDW: 17.6 % — ABNORMAL HIGH (ref 11.5–15.5)
WBC: 12.2 10*3/uL — AB (ref 4.0–10.5)

## 2015-07-01 LAB — CK: Total CK: 42 U/L (ref 38–234)

## 2015-07-01 LAB — BASIC METABOLIC PANEL
ANION GAP: 8 (ref 5–15)
Anion gap: 21 — ABNORMAL HIGH (ref 5–15)
Anion gap: 20 — ABNORMAL HIGH (ref 5–15)
Anion gap: 16 — ABNORMAL HIGH (ref 5–15)
BUN: 22 mg/dL — AB (ref 6–20)
BUN: 30 mg/dL — AB (ref 6–20)
BUN: 29 mg/dL — ABNORMAL HIGH (ref 6–20)
BUN: 25 mg/dL — AB (ref 6–20)
CALCIUM: 9.3 mg/dL (ref 8.9–10.3)
CO2: 17 mmol/L — ABNORMAL LOW (ref 22–32)
CO2: 15 mmol/L — ABNORMAL LOW (ref 22–32)
CO2: 15 mmol/L — ABNORMAL LOW (ref 22–32)
CO2: 21 mmol/L — ABNORMAL LOW (ref 22–32)
CREATININE: 1.44 mg/dL — AB (ref 0.44–1.00)
CREATININE: 1.56 mg/dL — AB (ref 0.44–1.00)
CREATININE: 1.64 mg/dL — AB (ref 0.44–1.00)
Calcium: 8.9 mg/dL (ref 8.9–10.3)
Calcium: 8.2 mg/dL — ABNORMAL LOW (ref 8.9–10.3)
Calcium: 8.2 mg/dL — ABNORMAL LOW (ref 8.9–10.3)
Chloride: 93 mmol/L — ABNORMAL LOW (ref 101–111)
Chloride: 98 mmol/L — ABNORMAL LOW (ref 101–111)
Chloride: 103 mmol/L (ref 101–111)
Chloride: 106 mmol/L (ref 101–111)
Creatinine, Ser: 1.18 mg/dL — ABNORMAL HIGH (ref 0.44–1.00)
GFR calc Af Amer: 47 mL/min — ABNORMAL LOW (ref >60)
GFR calc non Af Amer: 51 mL/min — ABNORMAL LOW (ref >60)
GFR, EST AFRICAN AMERICAN: 42 mL/min — AB (ref >60)
GFR, EST AFRICAN AMERICAN: 40 mL/min — AB (ref >60)
GFR, EST AFRICAN AMERICAN: 59 mL/min — AB (ref >60)
GFR, EST NON AFRICAN AMERICAN: 40 mL/min — AB (ref >60)
GFR, EST NON AFRICAN AMERICAN: 37 mL/min — AB (ref >60)
GFR, EST NON AFRICAN AMERICAN: 34 mL/min — AB (ref >60)
GLUCOSE: 601 mg/dL — AB (ref 65–99)
Glucose, Bld: 624 mg/dL (ref 65–99)
Glucose, Bld: 476 mg/dL — ABNORMAL HIGH (ref 65–99)
Glucose, Bld: 215 mg/dL — ABNORMAL HIGH (ref 65–99)
POTASSIUM: 4.7 mmol/L (ref 3.5–5.1)
POTASSIUM: 4.4 mmol/L (ref 3.5–5.1)
Potassium: 5.3 mmol/L — ABNORMAL HIGH (ref 3.5–5.1)
Potassium: 6.3 mmol/L (ref 3.5–5.1)
SODIUM: 131 mmol/L — AB (ref 135–145)
SODIUM: 133 mmol/L — AB (ref 135–145)
Sodium: 134 mmol/L — ABNORMAL LOW (ref 135–145)
Sodium: 135 mmol/L (ref 135–145)

## 2015-07-01 LAB — URINALYSIS, ROUTINE W REFLEX MICROSCOPIC
BILIRUBIN URINE: NEGATIVE
Glucose, UA: >1000 mg/dL — AB
Ketones, ur: >80 mg/dL — AB
Leukocytes, UA: NEGATIVE
Nitrite: NEGATIVE
PROTEIN: NEGATIVE mg/dL
Specific Gravity, Urine: 1.024 (ref 1.005–1.030)
UROBILINOGEN UA: 0.2 mg/dL (ref 0.0–1.0)
pH: 5.5 (ref 5.0–8.0)

## 2015-07-01 LAB — PROTIME-INR
INR: 2.84 — ABNORMAL HIGH (ref 0.00–1.49)
PROTHROMBIN TIME: 29.4 s — AB (ref 11.6–15.2)

## 2015-07-01 LAB — MRSA PCR SCREENING: MRSA BY PCR: POSITIVE — AB

## 2015-07-01 LAB — URINE MICROSCOPIC-ADD ON

## 2015-07-01 LAB — MAGNESIUM: Magnesium: 1.9 mg/dL (ref 1.7–2.4)

## 2015-07-01 MED ORDER — ONDANSETRON HCL 4 MG PO TABS
4.0000 mg | ORAL_TABLET | Freq: Four times a day (QID) | ORAL | Status: DC | PRN
Start: 2015-07-01 — End: 2015-07-09

## 2015-07-01 MED ORDER — MAGNESIUM HYDROXIDE 400 MG/5ML PO SUSP: 30.0000 mL | Freq: Every day | ORAL | Status: DC | PRN

## 2015-07-01 MED ORDER — ACETAMINOPHEN 325 MG PO TABS
650.0000 mg | ORAL_TABLET | Freq: Four times a day (QID) | ORAL | Status: DC | PRN
  Administered 2015-07-02 – 2015-07-03 (×2): 650 mg via ORAL
  Filled 2015-07-01 (×3): qty 2

## 2015-07-01 MED ORDER — INSULIN REGULAR BOLUS VIA INFUSION
0.0000 [IU] | Freq: Three times a day (TID) | INTRAVENOUS | Status: DC
Start: 2015-07-01 — End: 2015-07-01
  Filled 2015-07-01: qty 10

## 2015-07-01 MED ORDER — METHOTREXATE 2.5 MG PO TABS
2.5000 mg | ORAL_TABLET | ORAL | Status: DC
  Administered 2015-07-01 – 2015-07-08 (×3): 2.5 mg via ORAL
  Filled 2015-07-01 (×5): qty 1

## 2015-07-01 MED ORDER — SODIUM CHLORIDE 0.9 % IV SOLN: INTRAVENOUS | Status: DC

## 2015-07-01 MED ORDER — ONDANSETRON HCL 4 MG/2ML IJ SOLN: 4.0000 mg | Freq: Four times a day (QID) | INTRAMUSCULAR | Status: DC | PRN

## 2015-07-01 MED ORDER — SODIUM CHLORIDE 0.9 % IJ SOLN
3.0000 mL | Freq: Two times a day (BID) | INTRAMUSCULAR | Status: DC
  Administered 2015-07-02 – 2015-07-08 (×5): 3 mL via INTRAVENOUS

## 2015-07-01 MED ORDER — INSULIN ASPART 100 UNIT/ML ~~LOC~~ SOLN
0.0000 [IU] | Freq: Three times a day (TID) | SUBCUTANEOUS | Status: DC
  Administered 2015-07-02: 2 [IU] via SUBCUTANEOUS
  Administered 2015-07-02: 8 [IU] via SUBCUTANEOUS
  Administered 2015-07-02: 12 [IU] via SUBCUTANEOUS
  Administered 2015-07-03: 2 [IU] via SUBCUTANEOUS
  Administered 2015-07-03: 8 [IU] via SUBCUTANEOUS
  Administered 2015-07-04: 12 [IU] via SUBCUTANEOUS
  Administered 2015-07-05: 2 [IU] via SUBCUTANEOUS
  Administered 2015-07-05: 20 [IU] via SUBCUTANEOUS

## 2015-07-01 MED ORDER — ONDANSETRON HCL 4 MG/2ML IJ SOLN
4.0000 mg | Freq: Once | INTRAMUSCULAR | Status: AC
  Administered 2015-07-01: 4 mg via INTRAVENOUS
  Filled 2015-07-01: qty 2

## 2015-07-01 MED ORDER — ALUM & MAG HYDROXIDE-SIMETH 200-200-20 MG/5ML PO SUSP: 30.0000 mL | Freq: Four times a day (QID) | ORAL | Status: DC | PRN

## 2015-07-01 MED ORDER — DOCUSATE SODIUM 100 MG PO CAPS
100.0000 mg | ORAL_CAPSULE | Freq: Two times a day (BID) | ORAL | Status: DC
  Filled 2015-07-01: qty 1

## 2015-07-01 MED ORDER — SODIUM CHLORIDE 0.9 % IV SOLN
INTRAVENOUS | Status: AC
  Filled 2015-07-01: qty 2.5

## 2015-07-01 MED ORDER — SODIUM CHLORIDE 0.9 % IV BOLUS (SEPSIS)
500.0000 mL | Freq: Once | INTRAVENOUS | Status: AC
  Administered 2015-07-01: 500 mL via INTRAVENOUS

## 2015-07-01 MED ORDER — DOCUSATE SODIUM 100 MG PO CAPS
100.0000 mg | ORAL_CAPSULE | Freq: Two times a day (BID) | ORAL | Status: DC
  Administered 2015-07-01 – 2015-07-09 (×17): 100 mg via ORAL
  Filled 2015-07-01 (×16): qty 1

## 2015-07-01 MED ORDER — DEXTROSE-NACL 5-0.45 % IV SOLN
INTRAVENOUS | Status: DC
  Administered 2015-07-01 – 2015-07-02 (×2): via INTRAVENOUS

## 2015-07-01 MED ORDER — METOPROLOL TARTRATE 25 MG PO TABS
25.0000 mg | ORAL_TABLET | Freq: Every day | ORAL | Status: DC
  Filled 2015-07-01: qty 1

## 2015-07-01 MED ORDER — DEXTROSE 50 % IV SOLN: 25.0000 mL | INTRAVENOUS | Status: DC | PRN

## 2015-07-01 MED ORDER — CHLORHEXIDINE GLUCONATE CLOTH 2 % EX PADS
6.0000 | MEDICATED_PAD | Freq: Every day | CUTANEOUS | Status: AC
  Administered 2015-07-02 – 2015-07-04 (×3): 6 via TOPICAL

## 2015-07-01 MED ORDER — CLONAZEPAM 0.5 MG PO TABS
0.5000 mg | ORAL_TABLET | Freq: Two times a day (BID) | ORAL | Status: DC
  Filled 2015-07-01: qty 1

## 2015-07-01 MED ORDER — METOPROLOL TARTRATE 25 MG PO TABS
25.0000 mg | ORAL_TABLET | Freq: Every day | ORAL | Status: DC
  Administered 2015-07-01 – 2015-07-09 (×9): 25 mg via ORAL
  Filled 2015-07-01 (×8): qty 1

## 2015-07-01 MED ORDER — MUPIROCIN 2 % EX OINT
1.0000 "application " | TOPICAL_OINTMENT | Freq: Two times a day (BID) | CUTANEOUS | Status: AC
  Administered 2015-07-02 – 2015-07-06 (×9): 1 via NASAL
  Filled 2015-07-01: qty 22

## 2015-07-01 MED ORDER — PANTOPRAZOLE SODIUM 40 MG PO TBEC
40.0000 mg | DELAYED_RELEASE_TABLET | Freq: Every day | ORAL | Status: DC
  Filled 2015-07-01: qty 1

## 2015-07-01 MED ORDER — PRAVASTATIN SODIUM 20 MG PO TABS
20.0000 mg | ORAL_TABLET | Freq: Every day | ORAL | Status: DC
  Administered 2015-07-01 – 2015-07-09 (×9): 20 mg via ORAL
  Filled 2015-07-01 (×9): qty 1

## 2015-07-01 MED ORDER — OXYCODONE HCL 5 MG PO TABS
5.0000 mg | ORAL_TABLET | ORAL | Status: DC | PRN
  Administered 2015-07-01 – 2015-07-07 (×12): 5 mg via ORAL
  Filled 2015-07-01 (×11): qty 1

## 2015-07-01 MED ORDER — SODIUM CHLORIDE 0.9 % IV SOLN
INTRAVENOUS | Status: DC
  Filled 2015-07-01: qty 2.5

## 2015-07-01 MED ORDER — PANTOPRAZOLE SODIUM 40 MG PO TBEC
40.0000 mg | DELAYED_RELEASE_TABLET | Freq: Every day | ORAL | Status: DC
Start: 2015-07-01 — End: 2015-07-09
  Administered 2015-07-01 – 2015-07-09 (×9): 40 mg via ORAL
  Filled 2015-07-01 (×8): qty 1

## 2015-07-01 MED ORDER — BACLOFEN 10 MG PO TABS
10.0000 mg | ORAL_TABLET | Freq: Two times a day (BID) | ORAL | Status: DC
  Filled 2015-07-01: qty 1

## 2015-07-01 MED ORDER — ESCITALOPRAM OXALATE 10 MG PO TABS
20.0000 mg | ORAL_TABLET | Freq: Every day | ORAL | Status: DC
  Administered 2015-07-01 – 2015-07-09 (×9): 20 mg via ORAL
  Filled 2015-07-01 (×9): qty 2

## 2015-07-01 MED ORDER — SODIUM CHLORIDE 0.9 % IV BOLUS (SEPSIS)
1000.0000 mL | Freq: Once | INTRAVENOUS | Status: AC
  Administered 2015-07-01: 1000 mL via INTRAVENOUS

## 2015-07-01 MED ORDER — DARIFENACIN HYDROBROMIDE ER 7.5 MG PO TB24
7.5000 mg | ORAL_TABLET | Freq: Every day | ORAL | Status: DC
  Administered 2015-07-02 – 2015-07-09 (×8): 7.5 mg via ORAL
  Filled 2015-07-01 (×10): qty 1

## 2015-07-01 MED ORDER — HYDROMORPHONE HCL 1 MG/ML IJ SOLN: 1.0000 mg | INTRAMUSCULAR | Status: DC | PRN

## 2015-07-01 MED ORDER — HYDROMORPHONE HCL 1 MG/ML IJ SOLN
1.0000 mg | Freq: Once | INTRAMUSCULAR | Status: AC
  Administered 2015-07-01: 1 mg via INTRAVENOUS
  Filled 2015-07-01: qty 1

## 2015-07-01 MED ORDER — CLONAZEPAM 0.5 MG PO TABS
0.5000 mg | ORAL_TABLET | Freq: Two times a day (BID) | ORAL | Status: DC
  Administered 2015-07-01 – 2015-07-09 (×17): 0.5 mg via ORAL
  Filled 2015-07-01 (×16): qty 1

## 2015-07-01 MED ORDER — DARIFENACIN HYDROBROMIDE ER 7.5 MG PO TB24
7.5000 mg | ORAL_TABLET | Freq: Every day | ORAL | Status: DC
  Filled 2015-07-01: qty 1

## 2015-07-01 MED ORDER — BACLOFEN 10 MG PO TABS
10.0000 mg | ORAL_TABLET | Freq: Two times a day (BID) | ORAL | Status: DC
  Administered 2015-07-01 – 2015-07-09 (×17): 10 mg via ORAL
  Filled 2015-07-01 (×17): qty 1

## 2015-07-01 MED ORDER — MORPHINE SULFATE 4 MG/ML IJ SOLN
4.0000 mg | Freq: Once | INTRAMUSCULAR | Status: AC
  Administered 2015-07-01: 4 mg via INTRAVENOUS
  Filled 2015-07-01: qty 1

## 2015-07-01 MED ORDER — MORPHINE SULFATE 2 MG/ML IJ SOLN
2.0000 mg | INTRAMUSCULAR | Status: DC | PRN
Start: 2015-07-01 — End: 2015-07-04
  Administered 2015-07-01 – 2015-07-03 (×8): 2 mg via INTRAVENOUS
  Filled 2015-07-01 (×8): qty 1

## 2015-07-01 MED ORDER — DEXTROSE-NACL 5-0.45 % IV SOLN: INTRAVENOUS | Status: DC

## 2015-07-01 MED ORDER — ONDANSETRON HCL 4 MG/2ML IJ SOLN: 4.0000 mg | Freq: Three times a day (TID) | INTRAMUSCULAR | Status: DC | PRN

## 2015-07-01 MED ORDER — ZOLPIDEM TARTRATE 5 MG PO TABS: 5.0000 mg | ORAL_TABLET | Freq: Every evening | ORAL | Status: DC | PRN

## 2015-07-01 MED ORDER — INSULIN DETEMIR 100 UNIT/ML ~~LOC~~ SOLN
20.0000 [IU] | Freq: Once | SUBCUTANEOUS | Status: AC
  Administered 2015-07-01: 20 [IU] via SUBCUTANEOUS
  Filled 2015-07-01: qty 0.2

## 2015-07-01 MED ORDER — TRAZODONE HCL 50 MG PO TABS
25.0000 mg | ORAL_TABLET | Freq: Every evening | ORAL | Status: DC | PRN
  Administered 2015-07-03: 50 mg via ORAL
  Filled 2015-07-01: qty 1

## 2015-07-01 MED ORDER — METHOTREXATE 2.5 MG PO TABS
2.5000 mg | ORAL_TABLET | ORAL | Status: DC
  Filled 2015-07-01: qty 1

## 2015-07-01 MED ORDER — INSULIN ASPART 100 UNIT/ML ~~LOC~~ SOLN: 0.0000 [IU] | SUBCUTANEOUS | Status: DC

## 2015-07-01 MED ORDER — SODIUM CHLORIDE 0.9 % IV SOLN
INTRAVENOUS | Status: DC
  Administered 2015-07-01: 13:00:00 via INTRAVENOUS

## 2015-07-01 MED ORDER — CLOPIDOGREL BISULFATE 75 MG PO TABS: 75.0000 mg | ORAL_TABLET | Freq: Every day | ORAL | Status: DC

## 2015-07-01 MED ORDER — ACETAMINOPHEN 650 MG RE SUPP: 650.0000 mg | Freq: Four times a day (QID) | RECTAL | Status: DC | PRN

## 2015-07-01 MED ORDER — INSULIN DETEMIR 100 UNIT/ML ~~LOC~~ SOLN
20.0000 [IU] | Freq: Every day | SUBCUTANEOUS | Status: DC
  Administered 2015-07-02: 20 [IU] via SUBCUTANEOUS
  Filled 2015-07-01: qty 0.2

## 2015-07-01 MED ORDER — MORPHINE SULFATE 2 MG/ML IJ SOLN
INTRAMUSCULAR | Status: AC
  Filled 2015-07-01: qty 1

## 2015-07-01 NOTE — ED Provider Notes (Signed)
CSN: [id number]     Arrival date & time    History   First MD Initiated Contact with Patient 07/01/15 0858     Chief Complaint  Patient presents with  . Fall  . Ankle Pain    HPI  Madison [name] is a 54 year old female with PMHx of CVA, CAD, prosthetic aortic valve, DM2 and RA presenting after a mechanical fall. Pt complaining of severe left ankle pain and deformity. Reports no numbness or tingling of left foot and toes. Pt reports her foot became entangled in her sheets at 1 am and she fell to the floor. She was unable to stand and crawled to the living room to reach a phone. She reports being on the ground for around 3 hours before reaching a phone to call EMS. PTAR responded and moved her to the couch, elevated her foot and left. Pt called EMS again this morning and was brought here with ankle swelling and deformity. Denies lightheadedness, chest pain, shortness of breath causing her fall. She is on warfarin therapy. Denies other injuries sustained during the fall. Denies headaches, neck pain, chest pain, shortness of breath, abdominal pain, nausea, vomiting or muscle aches. Pt had last food at 1 PM yesterday and last drink of water at 10 PM last night.   Past Medical History  Diagnosis Date  . CAD (coronary artery disease)   . Obesity   . Hypercholesteremia   . HTN (hypertension)   . Dyslipidemia   . Aortic stenosis   . Thrombophlebitis   . Heart murmur   . Myocardial infarction 12/2014  . Pneumonia 11/2014; 12/2014  . Diabetes dx'd 1982    "went straight to insulin"  . GERD (gastroesophageal reflux disease)   . 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 Rheumatoid arthritis(714.0)     "hands" (01/19/2015)  . Anxiety   . Depression   . Allergy   . CHF (congestive heart failure)    Past Surgical History  Procedure Laterality Date  . Cesarean section  1995  . Foot fracture surgery    . Knee arthroscopy Right   .  Neuroplasty / transposition median nerve at carpal tunnel    . Left heart catheterization with coronary angiogram N/A 12/08/2014    Procedure: LEFT HEART CATHETERIZATION WITH CORONARY ANGIOGRAM;  Surgeon: Iran Ouch, MD;  Location: [facility] CATH LAB;  Service: Cardiovascular;  Laterality: N/A;  . Percutaneous coronary stent intervention (pci-s) N/A 12/09/2014    Procedure: PERCUTANEOUS CORONARY STENT INTERVENTION (PCI-S);  Surgeon: [name], MD;  Location: [facility] CATH LAB;  Service: Cardiovascular;  Laterality: N/A;  . Cardiac catheterization  12/08/2014  . Coronary angioplasty with stent placement  12/09/2014  . Aortic valve replacement (avr)/coronary artery bypass grafting (cabg)  "2014"    Madison [name] 03/08/2014  . Fracture surgery    . Tubal ligation  1995  . Cataract extraction Left    Family History  Problem Relation Age of Onset  . Stroke    . Heart disease Mother   . Heart disease Sister    Social History  Substance Use Topics  . Smoking status: Never Smoker   . Smokeless tobacco: Never Used  . Alcohol Use: No   OB History    No data available     Review of Systems  Constitutional: Negative for fever and chills.  Eyes: Negative for visual disturbance.  Respiratory: Negative for shortness  of breath.   Cardiovascular: Negative for chest pain.  Gastrointestinal: Negative for nausea, vomiting and abdominal pain.  Musculoskeletal: Positive for joint swelling and gait problem. Negative for neck pain.  Skin: Negative for wound.  Neurological: Positive for speech difficulty (baseline speech slur from past CVA). Negative for dizziness, syncope, light-headedness and headaches.      Allergies  Celebrex and Detrol  Home Medications   Prior to Admission medications   Medication Sig Start Date End Date Taking? Authorizing Provider  aspirin 81 MG tablet Take 1 tablet (81 mg total) by mouth daily. 10/11/14  Yes Leana Roe Elgergawy, MD  atorvastatin (LIPITOR) 20 MG tablet Take 20 mg  by mouth daily.   Yes Historical Provider, MD  baclofen (LIORESAL) 10 MG tablet TAKE 1 TABLET (10 MG TOTAL) BY MOUTH 2 (TWO) TIMES DAILY. 06/29/15  Yes [name], NP  clonazePAM (KLONOPIN) 0.5 MG tablet Take 0.5 mg by mouth 2 (two) times daily.   Yes Historical Provider, MD  escitalopram (LEXAPRO) 20 MG tablet Take 20 mg by mouth daily.   Yes Historical Provider, MD  Insulin NPH, Human,, Isophane, (HUMULIN N) 100 UNIT/ML Kiwkpen Inject 35 Units into the skin every morning. And pen needles, 1/day Patient taking differently: Inject 38 Units into the skin every morning. And pen needles, 1/day 06/06/15  Yes Romero Belling, MD  lisinopril (PRINIVIL,ZESTRIL) 2.5 MG tablet TAKE 1 TABLET BY MOUTH EVERY DAY 04/05/15  Yes [name], NP  metoprolol tartrate (LOPRESSOR) 25 MG tablet Take 0.5 tablets (12.5 mg total) by mouth 2 (two) times daily. Patient taking differently: Take 25 mg by mouth daily.  12/15/14  Yes Nishant Dhungel, MD  omeprazole (PRILOSEC) 40 MG capsule TAKE 1 CAPSULE (40 MG TOTAL) BY MOUTH DAILY. 11/05/14  Yes Nelwyn Salisbury, MD  pravastatin (PRAVACHOL) 20 MG tablet TAKE 1 TABLET BY MOUTH EVERY DAY 02/16/15  Yes Eulis Foster, FNP  RHEUMATREX 2.5 MG tablet TAKE 1 TABLET 2 TIMES A WEEK Patient taking differently: TAKE 1 TABLET 2 TIMES A WEEK (MON AND FRI) 05/24/15  Yes [name], NP  solifenacin (VESICARE) 5 MG tablet Take 1 tablet (5 mg total) by mouth daily. 05/03/15  Yes [name], NP  traZODone (DESYREL) 50 MG tablet Take 0.5-1 tablets (25-50 mg total) by mouth at bedtime as needed for sleep. 06/16/15  Yes [name], NP  warfarin (COUMADIN) 5 MG tablet TAKE 1-1.5 BY MOUTH DAILY AT 6PM. TAKE 1 TAB DAILY TUES,TH,F,SAT & SUN. TAKE 1& 1/2 TAB ON MON &WED Patient taking differently: Take 5 mg daily on Mon, Wed, Fri. 7.5 mg on all other days 05/16/15  Yes Nishant Dhungel, MD  zolpidem (AMBIEN) 10 MG tablet Take 10 mg by mouth at bedtime as needed. for sleep 06/17/15  Yes Historical  Provider, MD  clopidogrel (PLAVIX) 75 MG tablet Take 1 tablet (75 mg total) by mouth daily with breakfast. Patient not taking: Reported on 07/01/2015 05/23/15   Wendall Stade, MD  glucagon (GLUCAGON EMERGENCY) 1 MG injection Inject 1 mg into the vein once as needed. 02/08/15   Romero Belling, MD  glucose blood (ACCU-CHEK AVIVA) test strip Use to check blood sugar 5 times per day Dx Code E10.10 And lancets 5/day 01/10/15   Romero Belling, MD   BP 105/92 mmHg  Pulse 109  Temp(Src) 98.3 F (36.8 C) (Oral)  Resp 18  SpO2 98% Physical Exam  Constitutional: She is oriented to person, place, and time. She appears well-developed and well-nourished. No distress.  HENT:  Head: Normocephalic and atraumatic.  Mouth/Throat: Mucous membranes are dry.  Cardiovascular: Normal rate, regular rhythm and normal heart sounds.   Pulmonary/Chest: Effort normal and breath sounds normal.  Abdominal: Soft. She exhibits no distension. There is no tenderness.  Musculoskeletal:  Left ankle with visible deformity and swelling. Full ROM of left toes. Unable to assess ROM of left ankle 2/2 to pain. No tenderness of knee. ROM of left knee intact. Pedal pulses present b/l. Left foot warm to touch. ROM of right ankle, right knee and upper extremities intact.   Neurological: She is alert and oriented to person, place, and time.  Sensation intact over left toes and foot.   Skin: Skin is warm and dry.  Ecchymosis over left ankle. No wounds of upper and lower extremities    ED Course  Procedures (including critical care time) Labs Review Labs Reviewed  BASIC METABOLIC PANEL - Abnormal; Notable for the following:    Sodium 131 (*)    Potassium 5.3 (*)    Chloride 93 (*)    CO2 17 (*)    Glucose, Bld 601 (*)    BUN 22 (*)    Creatinine, Ser 1.44 (*)    GFR calc non Af Amer 40 (*)    GFR calc Af Amer 47 (*)    Anion gap 21 (*)    All other components within normal limits  CBC WITH DIFFERENTIAL/PLATELET - Abnormal; Notable  for the following:    WBC 12.2 (*)    Hemoglobin 9.5 (*)    HCT 30.4 (*)    MCV 74.9 (*)    MCH 23.4 (*)    RDW 17.6 (*)    Neutrophils Relative % 88 (*)    Neutro Abs 10.7 (*)    Lymphocytes Relative 9 (*)    All other components within normal limits  PROTIME-INR - Abnormal; Notable for the following:    Prothrombin Time 29.4 (*)    INR 2.84 (*)    All other components within normal limits  BASIC METABOLIC PANEL - Abnormal; Notable for the following:    Sodium 133 (*)    Potassium 6.3 (*)    Chloride 98 (*)    CO2 15 (*)    Glucose, Bld 624 (*)    BUN 30 (*)    Creatinine, Ser 1.56 (*)    GFR calc non Af Amer 37 (*)    GFR calc Af Amer 42 (*)    Anion gap 20 (*)    All other components within normal limits  GLUCOSE, CAPILLARY - Abnormal; Notable for the following:    Glucose-Capillary 554 (*)    All other components within normal limits  MRSA PCR SCREENING  CK  MAGNESIUM  BASIC METABOLIC PANEL  BASIC METABOLIC PANEL  BASIC METABOLIC PANEL  URINALYSIS, ROUTINE W REFLEX MICROSCOPIC (NOT AT [facility])  HEMOGLOBIN A1C  BASIC METABOLIC PANEL    Imaging Review Dg Ankle Complete Left  07/01/2015   CLINICAL DATA:  Postreduction imaging. Patient with left ankle fracture dislocation.  EXAM: LEFT ANKLE COMPLETE - 3+ VIEW  COMPARISON:  07/01/2015 at 9:43 a.m.  FINDINGS: Trimalleolar fracture has been reduced. The fracture components are in near-anatomic alignment and the talus is now normally aligned with the tibia. Ankle was encased in a plaster cast.  IMPRESSION: Well-aligned fracture fragments and normally aligned ankle mortise following close reduction.   Electronically Signed   By: Amie Portland M.D.   On: 07/01/2015 11:32   Dg Ankle  Complete Left  07/01/2015   CLINICAL DATA:  Larey Seat out of bed, caught ankle in sheets, having deformity, swelling and bruising to LEFT ankle  EXAM: LEFT ANKLE COMPLETE - 3+ VIEW  COMPARISON:  None  FINDINGS: Osseous demineralization.  Scattered soft  tissue swelling.  Oblique fracture of medial malleolus displaced laterally.  Oblique fracture of lateral malleolus displaced laterally.  Lateral and posterior tibiotalar dislocation.  Displaced posterior malleolar fracture fragment present as well.  Associated ankle deformity.  Intertarsal alignments appear grossly preserved.  IMPRESSION: Trimalleolar fracture-dislocation LEFT ankle as above.   Electronically Signed   By: Ulyses Southward M.D.   On: 07/01/2015 10:19   Ct Ankle Left Wo Contrast  07/01/2015   CLINICAL DATA:  Try malleolar fracture of the left ankle.  EXAM: CT OF THE LEFT ANKLE WITHOUT CONTRAST  TECHNIQUE: Multidetector CT imaging of the left ankle was performed according to the standard protocol. Multiplanar CT image reconstructions were also generated.  COMPARISON:  Radiographs dated 07/01/2015  FINDINGS: The fracture dislocation has been reduced. There is near anatomic alignment and position of the try malleolar fracture fragments. Ankle mortise has been restored.  The posterior malleolar fragment involves only yet 9 mm of the posterior articular surface. There is 1.5 mm of the articular surface step-off at the posterior malleolar fragment.  IMPRESSION: Near anatomic alignment of the try malleolar fracture. Slight step-off of the articular surface at the posterior malleolar fracture. Tiny bone fragments between the distal tibia and fibula at the level of the dome of the talus.   Electronically Signed   By: Francene Boyers M.D.   On: 07/01/2015 12:28   Portable Chest X-ray (1 View)  07/01/2015   CLINICAL DATA:  Preop for left ankle fracture. History of diabetes, CABG.  EXAM: PORTABLE CHEST - 1 VIEW  COMPARISON:  01/19/2015  FINDINGS: Status post median sternotomy. Heart size is normal. Lungs are clear. No pulmonary edema. There is gaseous distension of the stomach.  IMPRESSION: No active disease.   Electronically Signed   By: Norva Pavlov M.D.   On: 07/01/2015 13:25   I, Joseph Pierini Freddy Spadafora,  personally reviewed and evaluated these images and lab results as part of my medical decision-making.   EKG Interpretation None      MDM   Final diagnoses:  Diabetic ketoacidosis without coma associated with diabetes mellitus due to underlying condition  Trimalleolar fracture of ankle, closed, left, initial encounter    1. Left ankle trimalleolar fracture with tibiotalar dislocation - CBC, BMP, CK, INR, left ankle xray - 100 mcg fentanyl given by EMS - 500 cc bolus and 4 mg morphine given in ED - Consult ortho; Dr. Roda Shutters successfully reduced ankle in ED, recommends admission - Pt reports nausea, zofran 4 mg given in ED - Admit to stabilize blood glucose and get cardiac clearance before ortho surgery for ORIF  2. DKA - Pt found to be in DKA; glucose 601, anion gap 21, K 5.3 - 1 L bolus, 250 units insulin, 0.9% NS infusion, dextrose infusion in ED - Admit to telemetry for further medical management    Alveta Heimlich, PA-C 07/01/15 1604  Lavera Guise, MD 07/01/15 2122

## 2015-07-01 NOTE — Progress Notes (Signed)
Orthopedic Tech Progress Note Patient Details:  Madison [name] [birth year] [id number]  Patient ID: Madison [name], female   DOB: 1960/11/26, 54 y.o.   MRN: [id number] As ordered by Dr. Minette Brine, [name] 07/01/2015, 11:12 AM

## 2015-07-01 NOTE — Progress Notes (Addendum)
Called by RN for potassium of 6.3.  Patient's bicarb is also going down (15).  Very concerning.   Lab indicates sample is not hemolyzed.  Discussed with attending.  Will check EKG, Give 1 liter bolus of NS, plus continued fluids at 200 ml/hour.  Recheck BMET in 2 hours.   If potassium is not decreased would then give kayexalate.    Per RN CBG is still elevated at 533.  Discussed with RN that insulin from glucose drip should decrease cbg and potassium.     Diabetic Coordinator called to assist.    If no improvement in 2 hours would consider transfer to step down.  Algis Downs, PA-C [facility] Pager: [phone]

## 2015-07-01 NOTE — ED Notes (Signed)
Admitting at Bedside. Patient has complaints of pain at this time

## 2015-07-01 NOTE — Consult Note (Signed)
ORTHOPAEDIC CONSULTATION  REQUESTING PHYSICIAN: Lavera Guise, MD  Chief Complaint: Left ankle injury  HPI: Madison [name] is a 54 y.o. female who presents with closed left ankle injury s/p mechanical fall at 2 am at home.  Denies LOC, neck pain, abd pain.  Pain is severe in left ankle with obvious deformity and worse with any movement or weight bearing.  Pain does not radiate.  It's sharp pain.  Ortho consulted.  Past Medical History  Diagnosis Date  . CAD (coronary artery disease)   . Obesity   . Hypercholesteremia   . HTN (hypertension)   . Dyslipidemia   . Aortic stenosis   . Thrombophlebitis   . Heart murmur   . Myocardial infarction 12/2014  . Pneumonia 11/2014; 12/2014  . Diabetes dx'd 1982    "went straight to insulin"  . GERD (gastroesophageal reflux disease)   . 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 Rheumatoid arthritis(714.0)     "hands" (01/19/2015)  . Anxiety   . Depression   . Allergy   . CHF (congestive heart failure)    Past Surgical History  Procedure Laterality Date  . Cesarean section  1995  . Foot fracture surgery    . Knee arthroscopy Right   . Neuroplasty / transposition median nerve at carpal tunnel    . Left heart catheterization with coronary angiogram N/A 12/08/2014    Procedure: LEFT HEART CATHETERIZATION WITH CORONARY ANGIOGRAM;  Surgeon: Iran Ouch, MD;  Location: [facility] CATH LAB;  Service: Cardiovascular;  Laterality: N/A;  . Percutaneous coronary stent intervention (pci-s) N/A 12/09/2014    Procedure: PERCUTANEOUS CORONARY STENT INTERVENTION (PCI-S);  Surgeon: [name], MD;  Location: [facility] CATH LAB;  Service: Cardiovascular;  Laterality: N/A;  . Cardiac catheterization  12/08/2014  . Coronary angioplasty with stent placement  12/09/2014  . Aortic valve replacement (avr)/coronary artery bypass grafting (cabg)  "2014"    [name] 03/08/2014  . Fracture surgery    . Tubal  ligation  1995  . Cataract extraction Left    Social History   Social History  . Marital Status: Divorced    Spouse Name: N/A  . Number of Children: N/A  . Years of Education: N/A   Social History Main Topics  . Smoking status: Never Smoker   . Smokeless tobacco: Never Used  . Alcohol Use: No  . Drug Use: No  . Sexual Activity: Not Asked   Other Topics Concern  . None   Social History Narrative   Divroced and lives at home.    Has two sons who live in [place] at this time. (21&8 y/o).    Has a cat    Walks a lot.       She is having great success with her diet       Working with exercises that were given to her by PT/OT.    Family History  Problem Relation Age of Onset  . Stroke    . Heart disease Mother   . Heart disease Sister    Allergies  Allergen Reactions  . Celebrex [Celecoxib] Rash  . Detrol [Tolterodine] Hives   Prior to Admission medications   Medication Sig Start Date End Date Taking? Authorizing Provider  aspirin 81 MG tablet Take 1 tablet (81 mg total) by mouth daily. 10/11/14  Yes Leana Roe Elgergawy, MD  atorvastatin (LIPITOR) 20 MG tablet Take 20 mg  by mouth daily.   Yes Historical Provider, MD  baclofen (LIORESAL) 10 MG tablet TAKE 1 TABLET (10 MG TOTAL) BY MOUTH 2 (TWO) TIMES DAILY. 06/29/15  Yes [name], NP  clonazePAM (KLONOPIN) 0.5 MG tablet Take 0.5 mg by mouth 2 (two) times daily.   Yes Historical Provider, MD  escitalopram (LEXAPRO) 20 MG tablet Take 20 mg by mouth daily.   Yes Historical Provider, MD  Insulin NPH, Human,, Isophane, (HUMULIN N) 100 UNIT/ML Kiwkpen Inject 35 Units into the skin every morning. And pen needles, 1/day Patient taking differently: Inject 38 Units into the skin every morning. And pen needles, 1/day 06/06/15  Yes Romero Belling, MD  lisinopril (PRINIVIL,ZESTRIL) 2.5 MG tablet TAKE 1 TABLET BY MOUTH EVERY DAY 04/05/15  Yes [name], NP  metoprolol tartrate (LOPRESSOR) 25 MG tablet Take 0.5 tablets (12.5 mg total)  by mouth 2 (two) times daily. Patient taking differently: Take 25 mg by mouth daily.  12/15/14  Yes Nishant Dhungel, MD  omeprazole (PRILOSEC) 40 MG capsule TAKE 1 CAPSULE (40 MG TOTAL) BY MOUTH DAILY. 11/05/14  Yes Nelwyn Salisbury, MD  pravastatin (PRAVACHOL) 20 MG tablet TAKE 1 TABLET BY MOUTH EVERY DAY 02/16/15  Yes Eulis Foster, FNP  RHEUMATREX 2.5 MG tablet TAKE 1 TABLET 2 TIMES A WEEK Patient taking differently: TAKE 1 TABLET 2 TIMES A WEEK (MON AND FRI) 05/24/15  Yes [name], NP  solifenacin (VESICARE) 5 MG tablet Take 1 tablet (5 mg total) by mouth daily. 05/03/15  Yes [name], NP  traZODone (DESYREL) 50 MG tablet Take 0.5-1 tablets (25-50 mg total) by mouth at bedtime as needed for sleep. 06/16/15  Yes [name], NP  warfarin (COUMADIN) 5 MG tablet TAKE 1-1.5 BY MOUTH DAILY AT 6PM. TAKE 1 TAB DAILY TUES,TH,F,SAT & SUN. TAKE 1& 1/2 TAB ON MON &WED Patient taking differently: Take 5 mg daily on Mon, Wed, Fri. 7.5 mg on all other days 05/16/15  Yes Nishant Dhungel, MD  zolpidem (AMBIEN) 10 MG tablet Take 10 mg by mouth at bedtime as needed. for sleep 06/17/15  Yes Historical Provider, MD  clopidogrel (PLAVIX) 75 MG tablet Take 1 tablet (75 mg total) by mouth daily with breakfast. Patient not taking: Reported on 07/01/2015 05/23/15   Wendall Stade, MD  feeding supplement, GLUCERNA SHAKE, (GLUCERNA SHAKE) LIQD Take 237 mLs by mouth 2 (two) times daily between meals. Patient not taking: Reported on 07/01/2015 12/15/14   Nishant Dhungel, MD  glucagon (GLUCAGON EMERGENCY) 1 MG injection Inject 1 mg into the vein once as needed. 02/08/15   Romero Belling, MD  glucose blood (ACCU-CHEK AVIVA) test strip Use to check blood sugar 5 times per day Dx Code E10.10 And lancets 5/day 01/10/15   Romero Belling, MD  promethazine (PHENERGAN) 25 MG tablet Take 1 tablet (25 mg total) by mouth every 4 (four) hours as needed for nausea or vomiting. Patient not taking: Reported on 07/01/2015 10/08/14   Nelwyn Salisbury,  MD   Dg Ankle Complete Left  07/01/2015   CLINICAL DATA:  Larey Seat out of bed, caught ankle in sheets, having deformity, swelling and bruising to LEFT ankle  EXAM: LEFT ANKLE COMPLETE - 3+ VIEW  COMPARISON:  None  FINDINGS: Osseous demineralization.  Scattered soft tissue swelling.  Oblique fracture of medial malleolus displaced laterally.  Oblique fracture of lateral malleolus displaced laterally.  Lateral and posterior tibiotalar dislocation.  Displaced posterior malleolar fracture fragment present as well.  Associated ankle deformity.  Intertarsal alignments  appear grossly preserved.  IMPRESSION: Trimalleolar fracture-dislocation LEFT ankle as above.   Electronically Signed   By: Ulyses Southward M.D.   On: 07/01/2015 10:19    Positive ROS: All other systems have been reviewed and were otherwise negative with the exception of those mentioned in the HPI and as above.  Physical Exam: General: Alert, no acute distress Cardiovascular: No pedal edema Respiratory: No cyanosis, no use of accessory musculature GI: No organomegaly, abdomen is soft and non-tender Skin: No lesions in the area of chief complaint Neurologic: Sensation slightly decreased 2/2 peripheral neuropathy Psychiatric: Patient is competent for consent with normal mood and affect Lymphatic: No axillary or cervical lymphadenopathy  MUSCULOSKELETAL:  - closed deformity of left ankle - foot wwp, 2+ pulses - sensation slightly decreased - pain with movement of left ankle  Assessment: Left ankle trimall fx dislocation  Plan: - closed reduced in ER and splinted - needs admission for uncontrolled DM, h/o MI and CVA on warfarin, poor social support - elevate at all times - NWB - CT ankle ordered - plan for ORIF Monday if medically optimized by then - hold coumadin - may consider cardiac consult regarding coumadin  Thank you for the consult and the opportunity to see Ms. Ledell Noss Glee Arvin, MD [facility]  Orthopedics [phone] 11:05 AM

## 2015-07-01 NOTE — Progress Notes (Signed)
Critical lab values reported.  K is 6.3 and Glucose is 624.  Text sent to Dr. Waymon Amato.

## 2015-07-01 NOTE — Progress Notes (Signed)
Utilization review completed. Darcus Edds, RN, BSN. 

## 2015-07-01 NOTE — ED Notes (Signed)
Dr. Roda Shutters at bedside reduced ankle without issue; pt tolerated it well. ORtho tech at bedside splinting. Portable OTW.

## 2015-07-01 NOTE — Consult Note (Signed)
CARDIOLOGY CONSULT NOTE   Patient ID: Madison MIMBS MRN: [id number] DOB/AGE: 05/17/1961 54 y.o.  Admit date: 07/01/2015  Primary Physician   [name], NP Primary Cardiologist   Dr. Eden Emms Reason for Consultation  Pre-op clearance   HPI: [name] is a 54 y.o. female with a history of bicuspid AV s/p AVR 2012 on chronic Coumadin, multiple strokes, with residual aphasia and left hand weakness, CAD with stent to mid LAD 11/2014, HTN, HLD, DM, RA, anxiety & depression presented to [facility] on 07/01/15 following mechanical fall and severe left ankle pain and deformity  She was doing well on cardiac stand point of view when last seen by Dr. Eden Emms 03/2015. F/u in 1 year. Last echo 02/2014 showed normal gradients (15 and 26 mmHg), no AR, LV EF of 60-65%, grade 1 DD.   The patient reports she has been feeling very well lately and just moved into a new apartment a few days ago. She has not yet activated her life alert. This morning when she woke she had urgently go to the bathroom. She got up and tripped in her bed sheets. She laid on the floor for approximately 5 hours until she was able to speak with her aid and call EMS. She denies any chest pain, sob, palpitation, orthopnea, PND, LE edema, melena, blood in her stool, vomiting, diarrhea or urinary symptoms.  Evaluation revealed left ankle fracture, DKA, mild hyperkalemia and acute on chronic kidney disease. Chronic stable anemia. EKG shows sinus tachycardia at 101 bpm without acute changes and QTC 461 ms. Orthopedics has evaluated and done closed reduction in ER and splinted & plan for ORIF on 8/15 and now cardiology has consulted for cardiac clearance.  Past Medical History  Diagnosis Date  . CAD (coronary artery disease)   . Obesity   . Hypercholesteremia   . HTN (hypertension)   . Dyslipidemia   . Aortic stenosis   . Thrombophlebitis   . Heart murmur   . Myocardial infarction 12/2014  . Pneumonia 11/2014; 12/2014  . Diabetes dx'd 1982     "went straight to insulin"  . GERD (gastroesophageal reflux disease)   . 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 Rheumatoid arthritis(714.0)     "hands" (01/19/2015)  . Anxiety   . Depression   . Allergy   . CHF (congestive heart failure)      Past Surgical History  Procedure Laterality Date  . Cesarean section  1995  . Foot fracture surgery    . Knee arthroscopy Right   . Neuroplasty / transposition median nerve at carpal tunnel    . Left heart catheterization with coronary angiogram N/A 12/08/2014    Procedure: LEFT HEART CATHETERIZATION WITH CORONARY ANGIOGRAM;  Surgeon: Iran Ouch, MD;  Location: [facility] CATH LAB;  Service: Cardiovascular;  Laterality: N/A;  . Percutaneous coronary stent intervention (pci-s) N/A 12/09/2014    Procedure: PERCUTANEOUS CORONARY STENT INTERVENTION (PCI-S);  Surgeon: [name], MD;  Location: [facility] CATH LAB;  Service: Cardiovascular;  Laterality: N/A;  . Cardiac catheterization  12/08/2014  . Coronary angioplasty with stent placement  12/09/2014  . Aortic valve replacement (avr)/coronary artery bypass grafting (cabg)  "2014"    [name] 03/08/2014  . Fracture surgery    . Tubal ligation  1995  . Cataract extraction Left     Allergies  Allergen Reactions  . Celebrex [Celecoxib] Rash  . Detrol [  Tolterodine] Hives    I have reviewed the patient's current medications . sodium chloride   Intravenous STAT  . baclofen  10 mg Oral BID  . clonazePAM  0.5 mg Oral BID  . [START ON 07/02/2015] clopidogrel  75 mg Oral Q breakfast  . darifenacin  7.5 mg Oral Daily  . docusate sodium  100 mg Oral BID  . escitalopram  20 mg Oral Daily  . methotrexate  2.5 mg Oral Once per day on Mon Fri  . metoprolol tartrate  25 mg Oral Daily  . morphine      . pantoprazole  40 mg Oral Daily  . pravastatin  20 mg Oral Daily  . sodium chloride  3 mL Intravenous Q12H   . sodium chloride 150 mL/hr at  07/01/15 1301  . sodium chloride    . dextrose 5 % and 0.45% NaCl    . insulin (NOVOLIN-R) infusion     acetaminophen **OR** acetaminophen, alum & mag hydroxide-simeth, HYDROmorphone (DILAUDID) injection, magnesium hydroxide, morphine injection, ondansetron **OR** ondansetron (ZOFRAN) IV, oxyCODONE, traZODone, zolpidem  Prior to Admission medications   Medication Sig Start Date End Date Taking? Authorizing Provider  aspirin 81 MG tablet Take 1 tablet (81 mg total) by mouth daily. 10/11/14  Yes Leana Roe Elgergawy, MD  atorvastatin (LIPITOR) 20 MG tablet Take 20 mg by mouth daily.   Yes Historical Provider, MD  baclofen (LIORESAL) 10 MG tablet TAKE 1 TABLET (10 MG TOTAL) BY MOUTH 2 (TWO) TIMES DAILY. 06/29/15  Yes [name], NP  clonazePAM (KLONOPIN) 0.5 MG tablet Take 0.5 mg by mouth 2 (two) times daily.   Yes Historical Provider, MD  escitalopram (LEXAPRO) 20 MG tablet Take 20 mg by mouth daily.   Yes Historical Provider, MD  Insulin NPH, Human,, Isophane, (HUMULIN N) 100 UNIT/ML Kiwkpen Inject 35 Units into the skin every morning. And pen needles, 1/day Patient taking differently: Inject 38 Units into the skin every morning. And pen needles, 1/day 06/06/15  Yes Romero Belling, MD  lisinopril (PRINIVIL,ZESTRIL) 2.5 MG tablet TAKE 1 TABLET BY MOUTH EVERY DAY 04/05/15  Yes [name], NP  metoprolol tartrate (LOPRESSOR) 25 MG tablet Take 0.5 tablets (12.5 mg total) by mouth 2 (two) times daily. Patient taking differently: Take 25 mg by mouth daily.  12/15/14  Yes Nishant Dhungel, MD  omeprazole (PRILOSEC) 40 MG capsule TAKE 1 CAPSULE (40 MG TOTAL) BY MOUTH DAILY. 11/05/14  Yes Nelwyn Salisbury, MD  pravastatin (PRAVACHOL) 20 MG tablet TAKE 1 TABLET BY MOUTH EVERY DAY 02/16/15  Yes Eulis Foster, FNP  RHEUMATREX 2.5 MG tablet TAKE 1 TABLET 2 TIMES A WEEK Patient taking differently: TAKE 1 TABLET 2 TIMES A WEEK (MON AND FRI) 05/24/15  Yes [name], NP  solifenacin (VESICARE) 5 MG tablet Take 1  tablet (5 mg total) by mouth daily. 05/03/15  Yes [name], NP  traZODone (DESYREL) 50 MG tablet Take 0.5-1 tablets (25-50 mg total) by mouth at bedtime as needed for sleep. 06/16/15  Yes [name], NP  warfarin (COUMADIN) 5 MG tablet TAKE 1-1.5 BY MOUTH DAILY AT 6PM. TAKE 1 TAB DAILY TUES,TH,F,SAT & SUN. TAKE 1& 1/2 TAB ON MON &WED Patient taking differently: Take 5 mg daily on Mon, Wed, Fri. 7.5 mg on all other days 05/16/15  Yes Nishant Dhungel, MD  zolpidem (AMBIEN) 10 MG tablet Take 10 mg by mouth at bedtime as needed. for sleep 06/17/15  Yes Historical Provider, MD  clopidogrel (PLAVIX) 75 MG tablet Take  1 tablet (75 mg total) by mouth daily with breakfast. Patient not taking: Reported on 07/01/2015 05/23/15   Wendall Stade, MD  glucagon (GLUCAGON EMERGENCY) 1 MG injection Inject 1 mg into the vein once as needed. 02/08/15   Romero Belling, MD  glucose blood (ACCU-CHEK AVIVA) test strip Use to check blood sugar 5 times per day Dx Code E10.10 And lancets 5/day 01/10/15   Romero Belling, MD     Social History   Social History  . Marital Status: Divorced    Spouse Name: N/A  . Number of Children: N/A  . Years of Education: N/A   Occupational History  . Not on file.   Social History Main Topics  . Smoking status: Never Smoker   . Smokeless tobacco: Never Used  . Alcohol Use: No  . Drug Use: No  . Sexual Activity: Not on file   Other Topics Concern  . Not on file   Social History Narrative   Divroced and lives at home.    Has two sons who live in [place] at this time. (21&8 y/o).    Has a cat    Walks a lot.       She is having great success with her diet       Working with exercises that were given to her by PT/OT.     Family Status  Relation Status Death Age  . Mother Alive   . Father Deceased   . Brother Alive   . Sister Alive   . Sister Alive   . Sister Alive   . Sister Alive   . Sister Alive   . Brother Alive    Family History  Problem Relation Age of Onset  .  Stroke    . Heart disease Mother   . Heart disease Sister      ROS:  Full 14 point review of systems complete and found to be negative unless listed above.  Physical Exam: Blood pressure 105/92, pulse 109, temperature 98.3 F (36.8 C), temperature source Oral, resp. rate 18, SpO2 98 %.  General: Well developed, well nourished, female in no acute distress Head: Eyes PERRLA, No xanthomas. Normocephalic and atraumatic, oropharynx without edema or exudate.  Lungs: Resp regular and unlabored, CTA. Heart: RRR no s3, s4. Mechanic systolic murmur.   Neck: No carotid bruits. No lymphadenopathy +  JVD. Abdomen: Bowel sounds present, abdomen soft and non-tender without masses or hernias noted. Msk:  No spine or cva tenderness. No weakness, no joint deformities or effusions. Extremities: No clubbing, cyanosis or edema. DP/PT/Radials 2+ and equal bilaterally. Left lower extremity wrapped in Ace bandage and elevated. Other extremities are without effusion or edema Neuro: Alert and oriented X 3. Patient with residual slurred speech and left upper extremity weakness.Marland Kitchen Psych:  Good affect, responds appropriately Skin: No rashes or lesions noted.  Labs:   Lab Results  Component Value Date   WBC 12.2* 07/01/2015   HGB 9.5* 07/01/2015   HCT 30.4* 07/01/2015   MCV 74.9* 07/01/2015   PLT 234 07/01/2015    Recent Labs  07/01/15 1025  INR 2.84*    Recent Labs Lab 07/01/15 1025  NA 131*  K 5.3*  CL 93*  CO2 17*  BUN 22*  CREATININE 1.44*  CALCIUM 9.3  GLUCOSE 601*    Recent Labs  07/01/15 1025  CKTOTAL 42    Echo: Echo 4/15 Study Conclusions  - Left ventricle: The cavity size was normal. There was moderate concentric hypertrophy.  Systolic function was normal. The estimated ejection fraction was in the range of 60% to 65%. Wall motion was normal; there were no regional wall motion abnormalities. Doppler parameters are consistent with abnormal left ventricular  relaxation (grade 1 diastolic dysfunction). - Aortic valve: A mechanical aortic valve sits well inthe aortic position. There is no aortic insufficiency and no paravalvular leak. The peak and mean transaortic gradients are 15 and 26 mmHg, respectively and are normal for this type of prosthetic valve. - Aortic root: The aortic root was normal in size. - Mitral valve: Mild regurgitation. - Right ventricle: Systolic function was normal. - Tricuspid valve: No regurgitation. - Pulmonary arteries: Systolic pressure was within the normal range. - Inferior vena cava: The vessel was normal in size. - Pericardium, extracardiac: There was no pericardial effusion.  ECG:   Vent. rate 101 BPM PR interval 166 ms QRS duration 80 ms QT/QTc 356/461 ms P-R-T axes 42 63 -7  Radiology:  Dg Ankle Complete Left  07/01/2015   CLINICAL DATA:  Postreduction imaging. Patient with left ankle fracture dislocation.  EXAM: LEFT ANKLE COMPLETE - 3+ VIEW  COMPARISON:  07/01/2015 at 9:43 a.m.  FINDINGS: Trimalleolar fracture has been reduced. The fracture components are in near-anatomic alignment and the talus is now normally aligned with the tibia. Ankle was encased in a plaster cast.  IMPRESSION: Well-aligned fracture fragments and normally aligned ankle mortise following close reduction.   Electronically Signed   By: Amie Portland M.D.   On: 07/01/2015 11:32   Dg Ankle Complete Left  07/01/2015   CLINICAL DATA:  Larey Seat out of bed, caught ankle in sheets, having deformity, swelling and bruising to LEFT ankle  EXAM: LEFT ANKLE COMPLETE - 3+ VIEW  COMPARISON:  None  FINDINGS: Osseous demineralization.  Scattered soft tissue swelling.  Oblique fracture of medial malleolus displaced laterally.  Oblique fracture of lateral malleolus displaced laterally.  Lateral and posterior tibiotalar dislocation.  Displaced posterior malleolar fracture fragment present as well.  Associated ankle deformity.  Intertarsal  alignments appear grossly preserved.  IMPRESSION: Trimalleolar fracture-dislocation LEFT ankle as above.   Electronically Signed   By: Ulyses Southward M.D.   On: 07/01/2015 10:19   Ct Ankle Left Wo Contrast  07/01/2015   CLINICAL DATA:  Try malleolar fracture of the left ankle.  EXAM: CT OF THE LEFT ANKLE WITHOUT CONTRAST  TECHNIQUE: Multidetector CT imaging of the left ankle was performed according to the standard protocol. Multiplanar CT image reconstructions were also generated.  COMPARISON:  Radiographs dated 07/01/2015  FINDINGS: The fracture dislocation has been reduced. There is near anatomic alignment and position of the try malleolar fracture fragments. Ankle mortise has been restored.  The posterior malleolar fragment involves only yet 9 mm of the posterior articular surface. There is 1.5 mm of the articular surface step-off at the posterior malleolar fragment.  IMPRESSION: Near anatomic alignment of the try malleolar fracture. Slight step-off of the articular surface at the posterior malleolar fracture. Tiny bone fragments between the distal tibia and fibula at the level of the dome of the talus.   Electronically Signed   By: Francene Boyers M.D.   On: 07/01/2015 12:28   Portable Chest X-ray (1 View)  07/01/2015   CLINICAL DATA:  Preop for left ankle fracture. History of diabetes, CABG.  EXAM: PORTABLE CHEST - 1 VIEW  COMPARISON:  01/19/2015  FINDINGS: Status post median sternotomy. Heart size is normal. Lungs are clear. No pulmonary edema. There is gaseous distension of the  stomach.  IMPRESSION: No active disease.   Electronically Signed   By: Norva Pavlov M.D.   On: 07/01/2015 13:25    ASSESSMENT AND PLAN:     1.  Bicuspid AV s/p AVR 2012 on chronic Coumadin - INR of 2.84. Held Coumadin in anticipation surgery Monday. Coumadin to heparin per pharmacy. - Echo 02/2014 showed normal gradients (15 and 26 mmHg), no AR, LV EF of 60-65%, grade 1 DD.  - Echo pending  2. CAD - No chest pain. Cath  11/2014 - 90% mid LAD stenosis s/p PTCA & DES. On ASA/Plavix and coumadin at home. ASA and Coumadin held per orthopedic surgery request. She is schedule for Plavix tomorrow.  3. CVA - LUE weakness and aphasia residual   4. HTN - She was borderline hypotensive on admission. Held lisinopril. Continue BB.   5. CKD, stage III - Unclear baseline creatinine. Cr was 0.86 on 01/31/15 and 1.38 on 06/02/15.  - Held Lisinopril. Gentle IV fluids.  6. DKA - per primary  7. Left trimalleolar ankle fracture - after mechanical fall. No syncope or dizziness. Plan for surgery Monday. Given recent stent placement and mechanical aortic value she is at risk for any surgery. However, she is 6 moths post cath. Coumadin to heparin per pharmacy. Will discuss with MD regarding antiplatelet therapy. Currently on Plavix daily.   8.HL - Continue statin  9. RA - Per primary  Signed: Bhagat,Bhavinkumar, PA 07/01/2015, 3:08 PM Pager [phone]  Co-Sign MD   Patient seen and examined with Vin Bhagat, PA-C. We discussed all aspects of the encounter. I agree with the assessment and plan as stated above.   Patient with mechanical AVR and CAD with LAD stent in 11/2014. Now admitted with ankle fracture after mechanical fall. We are asked to provide pre-op cardiac risk stratification. She denies CP or HF symptoms. EF and valves table on recent echo. Cath in 1/16 with LAD disease which was stented otherwise mild non-obstructive CAD. She is low to moderate risk for peri-op cardiac complications and can proceed from our standpoint without further cardiac testing.  There has been some confusion about her blood thinner regimen. She is on ASA and coumadin for AVR and notes say Plavix for recent stent although she says she is not taking Plavix. Agree with continuing ASA and switching coumadin to IV heparin during peri-operative window. Ideally would keep on Plavix with LAD stent however as she is > 6 months out can likely stop for  a few days without oo much risk if surgery requires. On d/c would recommend restarting ASA 81, Plavix 75 and warfarin as previous. We will sign off. Please call with questions. IM team to manage DKA.   Dally Oshel,MD 4:54 PM

## 2015-07-01 NOTE — ED Notes (Signed)
Patient transported to X-ray 

## 2015-07-01 NOTE — ED Notes (Addendum)
PT fell out of bed; got caught in sheets; called 911 around 500 this morning PTAR got her off the floor and elevated ankle and left; pt called 911 again this morning; EMS responded; L ankle swollen. Takes thinners for stroke hx and MI hx. Pulses intact. Slurred speech part of baseline. Pt's CBG 558. Has not had anything to eat today. 100 mcg of fentanyl given in route and pain improved. BP dropped to 80's systolic. Fluids given. Pressure WNL currently.

## 2015-07-01 NOTE — ED Notes (Signed)
Pt returned from xray

## 2015-07-01 NOTE — Progress Notes (Signed)
Orthopedic Tech Progress Note Patient Details:  [name] Oct 29, 1961 [id number]  Ortho Devices Type of Ortho Device: Ace wrap, Post (short leg) splint Ortho Device/Splint Location: lle Ortho Device/Splint Interventions: Application   Dayden Viverette 07/01/2015, 11:12 AM

## 2015-07-01 NOTE — ED Notes (Addendum)
Dr. Xu at bedside

## 2015-07-01 NOTE — H&P (Signed)
[facility] History and Physical                                                                                    Madison [name], is a 54 y.o. female  MRN: [id number]   DOB - 1960/11/23  Admit Date - 07/01/2015  Outpatient Primary MD for the patient is [name], NP  Referring Physician:    Chief Complaint:   Chief Complaint  Patient presents with  . Fall  . Ankle Pain     HPI  Madison [name]  is a 54 y.o. female, with a prosthetic aortic valve on chronic Coumadin, insulin-dependent diabetes type 2, rheumatoid arthritis, cerebrovascular disease, and coronary artery disease who presents today after a mechanical fall with a displaced left ankle fracture and DKA. The patient reports she has been feeling very well lately and just moved into a new apartment a few days ago. She has not yet activated her life alert. This morning when she woke she had urgently go to the bathroom. She got up and tripped in her bed sheets. She laid on the floor for approximately 5 hours until she was able to speak with her aid and call EMS. She denies any recent illness, vomiting, diarrhea, urinary symptoms.  In the emergency department her vital signs are stable. Labs show multiple abnormalities mostly due to DKA including a potassium of 5.3, chloride 93, CO2 17, sodium 131.  Her hemoglobin is 9.5, white count 12.2. Glucose is 601 she has an anion gap of 21. X-ray shows a displaced left trimalleolar ankle fracture.  Review of Systems   In addition to the HPI above,  No Fever-chills, No Headache, No changes with Vision or hearing, No problems swallowing food or Liquids, No Chest pain, Cough or Shortness of Breath, No Abdominal pain, No Nausea or Vomiting, Bowel movements are regular, No Blood in stool or Urine, No dysuria, No new skin rashes or bruises, No recent weight gain or loss, A full 10 point Review of Systems was done, except as stated above, all other Review of Systems were negative.  Past  Medical History  Past Medical History  Diagnosis Date  . CAD (coronary artery disease)   . Obesity   . Hypercholesteremia   . HTN (hypertension)   . Dyslipidemia   . Aortic stenosis   . Thrombophlebitis   . Heart murmur   . Myocardial infarction 12/2014  . Pneumonia 11/2014; 12/2014  . Diabetes dx'd 1982    "went straight to insulin"  . GERD (gastroesophageal reflux disease)   . 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 Rheumatoid arthritis(714.0)     "hands" (01/19/2015)  . Anxiety   . Depression   . Allergy   . CHF (congestive heart failure)     Past Surgical History  Procedure Laterality Date  . Cesarean section  1995  . Foot fracture surgery    . Knee arthroscopy Right   . Neuroplasty / transposition median nerve at carpal tunnel    . Left heart catheterization with coronary angiogram N/A 12/08/2014    Procedure: LEFT  HEART CATHETERIZATION WITH CORONARY ANGIOGRAM;  Surgeon: Iran Ouch, MD;  Location: [facility] CATH LAB;  Service: Cardiovascular;  Laterality: N/A;  . Percutaneous coronary stent intervention (pci-s) N/A 12/09/2014    Procedure: PERCUTANEOUS CORONARY STENT INTERVENTION (PCI-S);  Surgeon: [name], MD;  Location: [facility] CATH LAB;  Service: Cardiovascular;  Laterality: N/A;  . Cardiac catheterization  12/08/2014  . Coronary angioplasty with stent placement  12/09/2014  . Aortic valve replacement (avr)/coronary artery bypass grafting (cabg)  "2014"    [name] 03/08/2014  . Fracture surgery    . Tubal ligation  1995  . Cataract extraction Left     Social History Social History  Substance Use Topics  . Smoking status: Never Smoker   . Smokeless tobacco: Never Used  . Alcohol Use: No   lives at home alone, has an aide daily. Was fairly independent with her ADLs.  Family History Family History  Problem Relation Age of Onset  . Stroke    . Heart disease Mother   . Heart disease Sister      Prior to Admission medications   Medication Sig Start Date End Date Taking? Authorizing Provider  aspirin 81 MG tablet Take 1 tablet (81 mg total) by mouth daily. 10/11/14  Yes Leana Roe Elgergawy, MD  atorvastatin (LIPITOR) 20 MG tablet Take 20 mg by mouth daily.   Yes Historical Provider, MD  baclofen (LIORESAL) 10 MG tablet TAKE 1 TABLET (10 MG TOTAL) BY MOUTH 2 (TWO) TIMES DAILY. 06/29/15  Yes [name], NP  clonazePAM (KLONOPIN) 0.5 MG tablet Take 0.5 mg by mouth 2 (two) times daily.   Yes Historical Provider, MD  escitalopram (LEXAPRO) 20 MG tablet Take 20 mg by mouth daily.   Yes Historical Provider, MD  Insulin NPH, Human,, Isophane, (HUMULIN N) 100 UNIT/ML Kiwkpen Inject 35 Units into the skin every morning. And pen needles, 1/day Patient taking differently: Inject 38 Units into the skin every morning. And pen needles, 1/day 06/06/15  Yes Romero Belling, MD  lisinopril (PRINIVIL,ZESTRIL) 2.5 MG tablet TAKE 1 TABLET BY MOUTH EVERY DAY 04/05/15  Yes [name], NP  metoprolol tartrate (LOPRESSOR) 25 MG tablet Take 0.5 tablets (12.5 mg total) by mouth 2 (two) times daily. Patient taking differently: Take 25 mg by mouth daily.  12/15/14  Yes Nishant Dhungel, MD  omeprazole (PRILOSEC) 40 MG capsule TAKE 1 CAPSULE (40 MG TOTAL) BY MOUTH DAILY. 11/05/14  Yes Nelwyn Salisbury, MD  pravastatin (PRAVACHOL) 20 MG tablet TAKE 1 TABLET BY MOUTH EVERY DAY 02/16/15  Yes Eulis Foster, FNP  RHEUMATREX 2.5 MG tablet TAKE 1 TABLET 2 TIMES A WEEK Patient taking differently: TAKE 1 TABLET 2 TIMES A WEEK (MON AND FRI) 05/24/15  Yes [name], NP  solifenacin (VESICARE) 5 MG tablet Take 1 tablet (5 mg total) by mouth daily. 05/03/15  Yes [name], NP  traZODone (DESYREL) 50 MG tablet Take 0.5-1 tablets (25-50 mg total) by mouth at bedtime as needed for sleep. 06/16/15  Yes [name], NP  warfarin (COUMADIN) 5 MG tablet TAKE 1-1.5 BY MOUTH DAILY AT 6PM. TAKE 1 TAB DAILY TUES,TH,F,SAT & SUN. TAKE 1&  1/2 TAB ON MON &WED Patient taking differently: Take 5 mg daily on Mon, Wed, Fri. 7.5 mg on all other days 05/16/15  Yes Nishant Dhungel, MD  zolpidem (AMBIEN) 10 MG tablet Take 10 mg by mouth at bedtime as needed. for sleep 06/17/15  Yes Historical Provider, MD  clopidogrel (PLAVIX) 75 MG tablet Take 1  tablet (75 mg total) by mouth daily with breakfast. Patient not taking: Reported on 07/01/2015 05/23/15   Wendall Stade, MD  glucagon (GLUCAGON EMERGENCY) 1 MG injection Inject 1 mg into the vein once as needed. 02/08/15   Romero Belling, MD  glucose blood (ACCU-CHEK AVIVA) test strip Use to check blood sugar 5 times per day Dx Code E10.10 And lancets 5/day 01/10/15   Romero Belling, MD    Allergies  Allergen Reactions  . Celebrex [Celecoxib] Rash  . Detrol [Tolterodine] Hives    Physical Exam  Vitals  Blood pressure 105/92, pulse 109, temperature 98.3 F (36.8 C), temperature source Oral, resp. rate 18, SpO2 98 %.   General: Well-developed, thin, female lying in bed in NAD  Psych:  Normal affect and insight, Not Suicidal or Homicidal, Awake Alert, Oriented X 3.  Neuro:   No acute deficits, patient with residual slurred speech and left upper extremity weakness.  ENT:  Ears and Eyes appear Normal, Conjunctivae clear, PER. Lips are dry  Neck:  Supple, No lymphadenopathy appreciated  Respiratory:  Symmetrical chest wall movement, Good air movement bilaterally, CTAB.  Cardiac:  Tachycardic, + systolic Murmur, no LE edema noted, no JVD.    Abdomen:  Positive bowel sounds, Soft, Non tender, Non distended,  No masses appreciated  Skin:  No Cyanosis, Normal Skin Turgor, No Skin Rash or Bruise.  Extremities:  Left lower extremity wrapped in Ace bandage and elevated. Other extremities are without effusion or edema.  Data Review  CBC  Recent Labs Lab 07/01/15 1025  WBC 12.2*  HGB 9.5*  HCT 30.4*  PLT 234  MCV 74.9*  MCH 23.4*  MCHC 31.3  RDW 17.6*  LYMPHSABS 1.1  MONOABS 0.4   EOSABS 0.0  BASOSABS 0.0    Chemistries   Recent Labs Lab 07/01/15 1025  NA 131*  K 5.3*  CL 93*  CO2 17*  GLUCOSE 601*  BUN 22*  CREATININE 1.44*  CALCIUM 9.3    Coagulation profile  Recent Labs Lab 07/01/15 1025  INR 2.84*     Urinalysis    Component Value Date/Time   COLORURINE YELLOW 06/02/2015 1900   APPEARANCEUR CLEAR 06/02/2015 1900   LABSPEC 1.026 06/02/2015 1900   PHURINE 5.0 06/02/2015 1900   GLUCOSEU >1000* 06/02/2015 1900   HGBUR NEGATIVE 06/02/2015 1900   BILIRUBINUR n 06/16/2015 1244   BILIRUBINUR NEGATIVE 06/02/2015 1900   KETONESUR 40* 06/02/2015 1900   PROTEINUR n 06/16/2015 1244   PROTEINUR NEGATIVE 06/02/2015 1900   UROBILINOGEN 0.2 06/16/2015 1244   UROBILINOGEN 0.2 06/02/2015 1900   NITRITE n 06/16/2015 1244   NITRITE NEGATIVE 06/02/2015 1900   LEUKOCYTESUR Trace* 06/16/2015 1244    Imaging results:   Dg Ankle Complete Left  07/01/2015   CLINICAL DATA:  Postreduction imaging. Patient with left ankle fracture dislocation.  EXAM: LEFT ANKLE COMPLETE - 3+ VIEW  COMPARISON:  07/01/2015 at 9:43 a.m.  FINDINGS: Trimalleolar fracture has been reduced. The fracture components are in near-anatomic alignment and the talus is now normally aligned with the tibia. Ankle was encased in a plaster cast.  IMPRESSION: Well-aligned fracture fragments and normally aligned ankle mortise following close reduction.   Electronically Signed   By: Amie Portland M.D.   On: 07/01/2015 11:32   Dg Ankle Complete Left  07/01/2015   CLINICAL DATA:  Larey Seat out of bed, caught ankle in sheets, having deformity, swelling and bruising to LEFT ankle  EXAM: LEFT ANKLE COMPLETE - 3+ VIEW  COMPARISON:  None  FINDINGS: Osseous demineralization.  Scattered soft tissue swelling.  Oblique fracture of medial malleolus displaced laterally.  Oblique fracture of lateral malleolus displaced laterally.  Lateral and posterior tibiotalar dislocation.  Displaced posterior malleolar fracture  fragment present as well.  Associated ankle deformity.  Intertarsal alignments appear grossly preserved.  IMPRESSION: Trimalleolar fracture-dislocation LEFT ankle as above.   Electronically Signed   By: Ulyses Southward M.D.   On: 07/01/2015 10:19   Ct Ankle Left Wo Contrast  07/01/2015   CLINICAL DATA:  Try malleolar fracture of the left ankle.  EXAM: CT OF THE LEFT ANKLE WITHOUT CONTRAST  TECHNIQUE: Multidetector CT imaging of the left ankle was performed according to the standard protocol. Multiplanar CT image reconstructions were also generated.  COMPARISON:  Radiographs dated 07/01/2015  FINDINGS: The fracture dislocation has been reduced. There is near anatomic alignment and position of the try malleolar fracture fragments. Ankle mortise has been restored.  The posterior malleolar fragment involves only yet 9 mm of the posterior articular surface. There is 1.5 mm of the articular surface step-off at the posterior malleolar fragment.  IMPRESSION: Near anatomic alignment of the try malleolar fracture. Slight step-off of the articular surface at the posterior malleolar fracture. Tiny bone fragments between the distal tibia and fibula at the level of the dome of the talus.   Electronically Signed   By: Francene Boyers M.D.   On: 07/01/2015 12:28    My personal review of EKG: EKG pending.   Assessment & Plan  Principal Problem:   Closed left ankle fracture Active Problems:   Rheumatoid arthritis   Chronic anticoagulation   Dysphagia, pharyngoesophageal phase   Spastic hemiplegia affecting nondominant side   Supratherapeutic INR   S/P aortic valve replacement   Essential hypertension   DKA, type 2    Left trimalleolar ankle fracture Mechanical fall. Patient has had no recent illness, dizziness or falls. Orthopedic surgery consulted. Fracture reduced. Plans to do surgery Monday. Cardiology consulted for preop clearance and management of anticoagulation and antiplatelets therapy. 2-D echo, EKG,  chest x-ray pending Orthopedic surgery requested that aspirin and coumadin be held.  Will defer mgmt to cardiology. Supportive care with ice, pain medications, incentive spirometry, out of bed with assistance. Nonweightbearing on LLE  DKA in a type 2 insulin-dependent diabetic Closely supervised by Dr. Romero Belling as an outpatient Anion gap is 21, bicarbonate 17. Given IV fluid bolus and Placed on glucose stabilizer in the ER. We will progress to basal bolus regimen when gap is closed.  Mechanical aortic valve replacement On chronic Coumadin therapy for this and a history of 5 strokes Have requested a heparin drip per pharmacy. Hold Coumadin in anticipation of surgery Monday.  Coronary artery disease with cerebral vascular disease History of 5 strokes with residual left upper extremity weakness and slurring of speech. Orthopedic surgery has requested that aspirin be held if possible. Continue beta blocker therapy  Essential hypertension Will hold lisinopril for now as her BP is not elevated. Continue beta blocker.  Chronic kidney disease stage III Creatinine at baseline? (Was 1.38 on 06/02/2015, but was 0.86 on 01/31/2015) Will hold lisinopril. Receiving gentle IV fluids.  Rheumatoid arthritis Continue methotrexate twice weekly.   Consultants Called:  Orthopedics, Dr. Roda Shutters  Family Communication:   Patient is alert, oriented and understands her plan of care  Code Status:  Full code  Condition:  Guarded  Potential Disposition: To be determined. Will likely need SNF rehabilitation  Time spent in minutes :  [address]  PA-C on 07/01/2015 at 12:43 PM Between 7am to 7pm - Pager - [phone] After 7pm go to [email or web address] - password [email or web address] And look for the night coverage person covering me after hours  [facility]

## 2015-07-01 NOTE — Progress Notes (Signed)
ANTICOAGULATION CONSULT NOTE - Initial Consult  Pharmacy Consult for Heparin Indication: mechanical aortic valve  Allergies  Allergen Reactions  . Celebrex [Celecoxib] Rash  . Detrol [Tolterodine] Hives    Patient Measurements:   Heparin Dosing Weight: 74 kg  Vital Signs: Temp: 98.3 F (36.8 C) (08/12 0908) Temp Source: Oral (08/12 0908) BP: 105/92 mmHg (08/12 1200) Pulse Rate: 109 (08/12 1200)  Labs:  Recent Labs  07/01/15 1025  HGB 9.5*  HCT 30.4*  PLT 234  LABPROT 29.4*  INR 2.84*  CREATININE 1.44*  CKTOTAL 42    CrCl cannot be calculated (Unknown ideal weight.).   Medical History: Past Medical History  Diagnosis Date  . CAD (coronary artery disease)   . Obesity   . Hypercholesteremia   . HTN (hypertension)   . Dyslipidemia   . Aortic stenosis   . Thrombophlebitis   . Heart murmur   . Myocardial infarction 12/2014  . Pneumonia 11/2014; 12/2014  . Diabetes dx'd 1982    "went straight to insulin"  . GERD (gastroesophageal reflux disease)   . 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 Rheumatoid arthritis(714.0)     "hands" (01/19/2015)  . Anxiety   . Depression   . Allergy   . CHF (congestive heart failure)     Medications:  Prescriptions prior to admission  Medication Sig Dispense Refill Last Dose  . aspirin 81 MG tablet Take 1 tablet (81 mg total) by mouth daily. 30 tablet  06/30/2015 at Unknown time  . atorvastatin (LIPITOR) 20 MG tablet Take 20 mg by mouth daily.   06/30/2015 at Unknown time  . baclofen (LIORESAL) 10 MG tablet TAKE 1 TABLET (10 MG TOTAL) BY MOUTH 2 (TWO) TIMES DAILY. 60 tablet 1 06/30/2015 at Unknown time  . clonazePAM (KLONOPIN) 0.5 MG tablet Take 0.5 mg by mouth 2 (two) times daily.   06/30/2015 at Unknown time  . escitalopram (LEXAPRO) 20 MG tablet Take 20 mg by mouth daily.   06/30/2015 at Unknown time  . Insulin NPH, Human,, Isophane, (HUMULIN N) 100 UNIT/ML  Kiwkpen Inject 35 Units into the skin every morning. And pen needles, 1/day (Patient taking differently: Inject 38 Units into the skin every morning. And pen needles, 1/day) 15 mL 11 06/30/2015 at Unknown time  . lisinopril (PRINIVIL,ZESTRIL) 2.5 MG tablet TAKE 1 TABLET BY MOUTH EVERY DAY 30 tablet 5 06/30/2015 at Unknown time  . metoprolol tartrate (LOPRESSOR) 25 MG tablet Take 0.5 tablets (12.5 mg total) by mouth 2 (two) times daily. (Patient taking differently: Take 25 mg by mouth daily. ) 60 tablet 0 06/30/2015 at 900  . omeprazole (PRILOSEC) 40 MG capsule TAKE 1 CAPSULE (40 MG TOTAL) BY MOUTH DAILY. 30 capsule 6 06/30/2015 at Unknown time  . pravastatin (PRAVACHOL) 20 MG tablet TAKE 1 TABLET BY MOUTH EVERY DAY 90 tablet 0 06/30/2015 at Unknown time  . RHEUMATREX 2.5 MG tablet TAKE 1 TABLET 2 TIMES A WEEK (Patient taking differently: TAKE 1 TABLET 2 TIMES A WEEK (MON AND FRI)) 24 tablet 1 Past Week at Unknown time  . solifenacin (VESICARE) 5 MG tablet Take 1 tablet (5 mg total) by mouth daily. 90 tablet 2 06/30/2015 at Unknown time  . traZODone (DESYREL) 50 MG tablet Take 0.5-1 tablets (25-50 mg total) by mouth at bedtime as needed for sleep. 30 tablet 3 06/30/2015 at Unknown time  . warfarin (COUMADIN) 5 MG tablet TAKE 1-1.5 BY  MOUTH DAILY AT 6PM. TAKE 1 TAB DAILY TUES,TH,F,SAT & SUN. TAKE 1& 1/2 TAB ON MON &WED (Patient taking differently: Take 5 mg daily on Mon, Wed, Fri. 7.5 mg on all other days) 60 tablet 0 06/30/2015 at Unknown time  . zolpidem (AMBIEN) 10 MG tablet Take 10 mg by mouth at bedtime as needed. for sleep  1 06/30/2015 at Unknown time  . clopidogrel (PLAVIX) 75 MG tablet Take 1 tablet (75 mg total) by mouth daily with breakfast. (Patient not taking: Reported on 07/01/2015) 30 tablet 2 Taking  . glucagon (GLUCAGON EMERGENCY) 1 MG injection Inject 1 mg into the vein once as needed. 1 each 5 not taken  . glucose blood (ACCU-CHEK AVIVA) test strip Use to check blood sugar 5 times per day Dx Code  E10.10 And lancets 5/day 150 each 11 n/a   Scheduled:  . sodium chloride   Intravenous STAT  . baclofen  10 mg Oral BID  . clonazePAM  0.5 mg Oral BID  . [START ON 07/02/2015] clopidogrel  75 mg Oral Q breakfast  . darifenacin  7.5 mg Oral Daily  . docusate sodium  100 mg Oral BID  . escitalopram  20 mg Oral Daily  . methotrexate  2.5 mg Oral Once per day on Mon Fri  . metoprolol tartrate  25 mg Oral Daily  . morphine      . pantoprazole  40 mg Oral Daily  . pravastatin  20 mg Oral Daily  . sodium chloride  3 mL Intravenous Q12H   Infusions:  . sodium chloride 150 mL/hr at 07/01/15 1301  . sodium chloride    . dextrose 5 % and 0.45% NaCl    . insulin (NOVOLIN-R) infusion      Assessment: 54yo female with history prosthetic aortic valve on warfarin, DM2, RA and CAD presents after mechanical fall and ankle fracture. Pharmacy is consulted to dose heparin for mechanical aortic valve when INR < 2.5. INR 2.84, Hgb 9.5, Plt 234, sCr 1.44.  Goal of Therapy:  INR 2.5-3.5 Monitor platelets by anticoagulation protocol: Yes   Plan:  Start Heparin when INR < 2.5 Check INR in the morning Monitor s/sx of bleeding  Arlean Hopping. Newman Pies, PharmD Clinical Pharmacist Pager [phone] 07/01/2015,1:01 PM

## 2015-07-02 DIAGNOSIS — S82852A Displaced trimalleolar fracture of left lower leg, initial encounter for closed fracture: Principal | ICD-10-CM

## 2015-07-02 DIAGNOSIS — M069 Rheumatoid arthritis, unspecified: Secondary | ICD-10-CM

## 2015-07-02 LAB — BASIC METABOLIC PANEL
ANION GAP: 12 (ref 5–15)
ANION GAP: 6 (ref 5–15)
ANION GAP: 13 (ref 5–15)
ANION GAP: 8 (ref 5–15)
Anion gap: 11 (ref 5–15)
Anion gap: 8 (ref 5–15)
BUN: 12 mg/dL (ref 6–20)
BUN: 15 mg/dL (ref 6–20)
BUN: 18 mg/dL (ref 6–20)
BUN: 21 mg/dL — ABNORMAL HIGH (ref 6–20)
BUN: 22 mg/dL — ABNORMAL HIGH (ref 6–20)
BUN: 9 mg/dL (ref 6–20)
CALCIUM: 8 mg/dL — AB (ref 8.9–10.3)
CALCIUM: 8.4 mg/dL — AB (ref 8.9–10.3)
CALCIUM: 8.2 mg/dL — AB (ref 8.9–10.3)
CHLORIDE: 104 mmol/L (ref 101–111)
CHLORIDE: 103 mmol/L (ref 101–111)
CO2: 17 mmol/L — AB (ref 22–32)
CO2: 18 mmol/L — ABNORMAL LOW (ref 22–32)
CO2: 19 mmol/L — ABNORMAL LOW (ref 22–32)
CO2: 22 mmol/L (ref 22–32)
CO2: 23 mmol/L (ref 22–32)
CO2: 25 mmol/L (ref 22–32)
CREATININE: 1.28 mg/dL — AB (ref 0.44–1.00)
CREATININE: 1.02 mg/dL — AB (ref 0.44–1.00)
CREATININE: 0.97 mg/dL (ref 0.44–1.00)
Calcium: 8.3 mg/dL — ABNORMAL LOW (ref 8.9–10.3)
Calcium: 8.3 mg/dL — ABNORMAL LOW (ref 8.9–10.3)
Calcium: 8.5 mg/dL — ABNORMAL LOW (ref 8.9–10.3)
Chloride: 102 mmol/L (ref 101–111)
Chloride: 103 mmol/L (ref 101–111)
Chloride: 103 mmol/L (ref 101–111)
Chloride: 105 mmol/L (ref 101–111)
Creatinine, Ser: 1.03 mg/dL — ABNORMAL HIGH (ref 0.44–1.00)
Creatinine, Ser: 1.06 mg/dL — ABNORMAL HIGH (ref 0.44–1.00)
Creatinine, Ser: 1.16 mg/dL — ABNORMAL HIGH (ref 0.44–1.00)
GFR calc Af Amer: 54 mL/min — ABNORMAL LOW (ref >60)
GFR calc Af Amer: >60 mL/min (ref >60)
GFR calc Af Amer: >60 mL/min (ref >60)
GFR calc Af Amer: >60 mL/min (ref >60)
GFR calc Af Amer: >60 mL/min (ref >60)
GFR calc non Af Amer: 47 mL/min — ABNORMAL LOW (ref >60)
GFR calc non Af Amer: 52 mL/min — ABNORMAL LOW (ref >60)
GFR calc non Af Amer: >60 mL/min (ref >60)
GFR, EST NON AFRICAN AMERICAN: 58 mL/min — AB (ref >60)
GLUCOSE: 313 mg/dL — AB (ref 65–99)
GLUCOSE: 259 mg/dL — AB (ref 65–99)
GLUCOSE: 160 mg/dL — AB (ref 65–99)
GLUCOSE: 106 mg/dL — AB (ref 65–99)
Glucose, Bld: 191 mg/dL — ABNORMAL HIGH (ref 65–99)
Glucose, Bld: 96 mg/dL (ref 65–99)
POTASSIUM: 3.8 mmol/L (ref 3.5–5.1)
POTASSIUM: 3.9 mmol/L (ref 3.5–5.1)
POTASSIUM: 4.1 mmol/L (ref 3.5–5.1)
POTASSIUM: 4.5 mmol/L (ref 3.5–5.1)
POTASSIUM: 4.6 mmol/L (ref 3.5–5.1)
Potassium: 4.8 mmol/L (ref 3.5–5.1)
SODIUM: 131 mmol/L — AB (ref 135–145)
Sodium: 132 mmol/L — ABNORMAL LOW (ref 135–145)
Sodium: 134 mmol/L — ABNORMAL LOW (ref 135–145)
Sodium: 134 mmol/L — ABNORMAL LOW (ref 135–145)
Sodium: 135 mmol/L (ref 135–145)
Sodium: 136 mmol/L (ref 135–145)

## 2015-07-02 LAB — GLUCOSE, CAPILLARY
GLUCOSE-CAPILLARY: 111 mg/dL — AB (ref 65–99)
GLUCOSE-CAPILLARY: 209 mg/dL — AB (ref 65–99)
GLUCOSE-CAPILLARY: 236 mg/dL — AB (ref 65–99)
GLUCOSE-CAPILLARY: 91 mg/dL (ref 65–99)
Glucose-Capillary: 274 mg/dL — ABNORMAL HIGH (ref 65–99)
Glucose-Capillary: 148 mg/dL — ABNORMAL HIGH (ref 65–99)

## 2015-07-02 LAB — CBC
HEMATOCRIT: 26.5 % — AB (ref 36.0–46.0)
HEMOGLOBIN: 8 g/dL — AB (ref 12.0–15.0)
MCH: 22.8 pg — ABNORMAL LOW (ref 26.0–34.0)
MCHC: 30.2 g/dL (ref 30.0–36.0)
MCV: 75.5 fL — ABNORMAL LOW (ref 78.0–100.0)
PLATELETS: 254 10*3/uL (ref 150–400)
RBC: 3.51 MIL/uL — ABNORMAL LOW (ref 3.87–5.11)
RDW: 17.8 % — ABNORMAL HIGH (ref 11.5–15.5)
WBC: 11.6 10*3/uL — AB (ref 4.0–10.5)

## 2015-07-02 LAB — HEMOGLOBIN A1C
Hgb A1c MFr Bld: 11.2 % — ABNORMAL HIGH (ref 4.8–5.6)
Mean Plasma Glucose: 275 mg/dL

## 2015-07-02 LAB — PROTIME-INR
INR: 4.43 — AB (ref 0.00–1.49)
Prothrombin Time: 41 seconds — ABNORMAL HIGH (ref 11.6–15.2)

## 2015-07-02 MED ORDER — INSULIN DETEMIR 100 UNIT/ML ~~LOC~~ SOLN
18.0000 [IU] | Freq: Two times a day (BID) | SUBCUTANEOUS | Status: DC
  Administered 2015-07-02 – 2015-07-07 (×10): 18 [IU] via SUBCUTANEOUS
  Filled 2015-07-02 (×11): qty 0.18

## 2015-07-02 MED ORDER — SODIUM CHLORIDE 0.9 % IV SOLN
INTRAVENOUS | Status: DC
  Administered 2015-07-02 – 2015-07-06 (×2): via INTRAVENOUS

## 2015-07-02 MED ORDER — INSULIN ASPART 100 UNIT/ML ~~LOC~~ SOLN
5.0000 [IU] | Freq: Once | SUBCUTANEOUS | Status: AC
  Administered 2015-07-02: 5 [IU] via SUBCUTANEOUS

## 2015-07-02 NOTE — Progress Notes (Signed)
[facility] PROGRESS NOTE  Madison [name] EQA:[id number] DOB: 1961/11/01 DOA: 07/01/2015 PCP: [name], NP  Assessment/Plan: Left trimalleolar ankle fracture: Mechanical fall. Patient has had no recent illness, dizziness or falls prior to this one. -Orthopedic surgery consulted. Fracture reduced. Plans to do surgery Monday. -Cardiology consulted for preop clearance and management of anticoagulation and antiplatelets therapy. 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 Romero Belling as an outpatient -DKA resolved now after IV insulin and IVF's -Anion gap is 13, bicarbonate 20. -will use lantus BID and SSI  Mechanical aortic valve replacement -On chronic Coumadin therapy for this and a history of 5 strokes -Have requested a heparin drip per pharmacy. 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 -will continue B-blocker  -holding ASA, plavix and coumadin for surgery -pharmacy to dose heparin -after surgery will resume triple anticoagulation therapy   Essential hypertension -patient with soft BP on presentation -Will hold lisinopril for now; Continue beta blocker. -continue IVF's  Chronic kidney disease stage III -mproved after IVF's and holding ACE inhibitors  -will monitor  Rheumatoid arthritis -Continue methotrexate   Code Status: Full Family Communication: no family at bedside  Disposition Plan: to be determine    Consultants:  Orthopedic  service (Dr. Roda Shutters)  Procedures:  Left ankle surgery planned for 07/04/15  Antibiotics:  None   HPI/Subjective: Afebrile, no CP, no SOB. Reported pain is ok. DKA resolved.  Objective: Filed Vitals:   07/02/15 1340  BP: 110/52  Pulse: 80  Temp: 98.2 F (36.8 C)  Resp: 16   No intake or output data in the 24 hours ending 07/02/15 1633 There were no vitals filed for this visit.  Exam:   General:  Feeling better, no CP, no abd pain, no nausea or vomiting. Reports that her left ankle is pain is well controlled. No fever  Cardiovascular: S1 and S2, no rubs or gallops  Respiratory: CTA bilaterally  Abdomen: soft, NT, ND, positive BS  Musculoskeletal: no cyanosis or clubbing. Decrease range of motion on her foot due to pain  Data Reviewed: Basic Metabolic Panel:  Recent Labs Lab 07/01/15 1448  07/01/15 2020 07/02/15 0017 07/02/15 0439 07/02/15 0800 07/02/15 1222  NA 133*  < > 135 134* 131* 132* 135  K 6.3*  < > 4.4 4.6 4.8 4.1 4.5  CL 98*  < > 106 105 102 104 103  CO2 15*  < > 21* 18* 17* 22 19*  GLUCOSE 624*  < > 215* 96 313* 191* 259*  BUN 30*  < > 25* 22* 21* 18 15  CREATININE 1.56*  < > 1.18* 1.03* 1.28* 1.06* 1.16*  CALCIUM 8.9  < > 8.2* 8.2* 8.3* 8.3* 8.0*  MG 1.9  --   --   --   --   --   --   < > = values in this interval not displayed.  CBC:  Recent Labs Lab 07/01/15 1025 07/02/15 0439  WBC 12.2* 11.6*  NEUTROABS 10.7*  --   HGB 9.5* 8.0*  HCT 30.4*  26.5*  MCV 74.9* 75.5*  PLT 234 254   Cardiac Enzymes:  Recent Labs Lab 07-04-15 1025  CKTOTAL 42   ProBNP (last 3 results)  Recent Labs  09/30/14 1431  PROBNP 794.3*    CBG:  Recent Labs Lab Jul 04, 2015 2145 07/02/15 07/02/15 0406 07/02/15 0630 07/02/15 1125  GLUCAP 108* 91 274* 236* 209*    Recent Results (from the past 240 hour(s))  MRSA PCR Screening     Status: Abnormal   Collection Time: 2015-07-04  2:48 PM  Result Value Ref Range Status   MRSA by PCR POSITIVE (A)  NEGATIVE Final    Comment:        The GeneXpert MRSA Assay (FDA approved for NASAL specimens only), is one component of a comprehensive MRSA colonization surveillance program. It is not intended to diagnose MRSA infection nor to guide or monitor treatment for MRSA infections. RESULT CALLED TO, READ BACK BY AND VERIFIED WITH: Charolotte Eke RN 17:00 July 04, 2015 ([email or web address])      Studies: Dg Ankle Complete Left  Jul 04, 2015   CLINICAL DATA:  Postreduction imaging. Patient with left ankle fracture dislocation.  EXAM: LEFT ANKLE COMPLETE - 3+ VIEW  COMPARISON:  Jul 04, 2015 at 9:43 a.m.  FINDINGS: Trimalleolar fracture has been reduced. The fracture components are in near-anatomic alignment and the talus is now normally aligned with the tibia. Ankle was encased in a plaster cast.  IMPRESSION: Well-aligned fracture fragments and normally aligned ankle mortise following close reduction.   Electronically Signed   By: Amie Portland M.D.   On: 07/04/15 11:32   Dg Ankle Complete Left  2015/07/04   CLINICAL DATA:  Larey Seat out of bed, caught ankle in sheets, having deformity, swelling and bruising to LEFT ankle  EXAM: LEFT ANKLE COMPLETE - 3+ VIEW  COMPARISON:  None  FINDINGS: Osseous demineralization.  Scattered soft tissue swelling.  Oblique fracture of medial malleolus displaced laterally.  Oblique fracture of lateral malleolus displaced laterally.  Lateral and posterior tibiotalar dislocation.  Displaced posterior malleolar fracture fragment present as well.  Associated ankle deformity.  Intertarsal alignments appear grossly preserved.  IMPRESSION: Trimalleolar fracture-dislocation LEFT ankle as above.   Electronically Signed   By: Ulyses Southward M.D.   On: 2015-07-04 10:19   Ct Ankle Left Wo Contrast  07-04-2015   CLINICAL DATA:  Try malleolar fracture of the left ankle.  EXAM: CT OF THE LEFT ANKLE WITHOUT CONTRAST  TECHNIQUE: Multidetector CT imaging of the left ankle was performed according to the standard  protocol. Multiplanar CT image reconstructions were also generated.  COMPARISON:  Radiographs dated 2015/07/04  FINDINGS: The fracture dislocation has been reduced. There is near anatomic alignment and position of the try malleolar fracture fragments. Ankle mortise has been restored.  The posterior malleolar fragment involves only yet 9 mm of the posterior articular surface. There is 1.5 mm of the articular surface step-off at the posterior malleolar fragment.  IMPRESSION: Near anatomic alignment of the try malleolar fracture. Slight step-off of the articular surface at the posterior malleolar fracture. Tiny bone fragments between the distal tibia and fibula at the level of the dome of the talus.   Electronically Signed   By: Francene Boyers M.D.   On: Jul 04, 2015 12:28   Portable Chest X-ray (1 View)  04-Jul-2015   CLINICAL DATA:  Preop for left ankle fracture. History of diabetes, CABG.  EXAM: PORTABLE CHEST - 1 VIEW  COMPARISON:  01/19/2015  FINDINGS: Status post median sternotomy. Heart size is normal. Lungs are clear.  No pulmonary edema. There is gaseous distension of the stomach.  IMPRESSION: No active disease.   Electronically Signed   By: Norva Pavlov M.D.   On: 07/01/2015 13:25    Scheduled Meds: . baclofen  10 mg Oral BID  . Chlorhexidine Gluconate Cloth  6 each Topical Q0600  . clonazePAM  0.5 mg Oral BID  . darifenacin  7.5 mg Oral Daily  . docusate sodium  100 mg Oral BID  . escitalopram  20 mg Oral Daily  . insulin aspart  0-24 Units Subcutaneous TID WC  . insulin detemir  18 Units Subcutaneous BID  . methotrexate  2.5 mg Oral Once per day on Mon Fri  . metoprolol tartrate  25 mg Oral Daily  . mupirocin ointment  1 application Nasal BID  . pantoprazole  40 mg Oral Daily  . pravastatin  20 mg Oral Daily  . sodium chloride  3 mL Intravenous Q12H   Continuous Infusions: . sodium chloride 75 mL/hr at 07/02/15 1409    Principal Problem:   Closed left ankle fracture Active  Problems:   Rheumatoid arthritis   Chronic anticoagulation   Dysphagia, pharyngoesophageal phase   Spastic hemiplegia affecting nondominant side   Supratherapeutic INR   S/P aortic valve replacement   Essential hypertension   DKA, type 2   Trimalleolar fracture of left ankle   DKA, type 2, not at goal   Hyperkalemia   Acute kidney injury    Time spent: 30 minutes    Madison [name]  [facility] Pager [phone]. If 7PM-7AM, please contact night-coverage at [email or web address], password [facility] 07/02/2015, 4:33 PM  LOS: 1 day

## 2015-07-02 NOTE — Progress Notes (Signed)
Patient's blood sugar @4 :10am increased to 274. MD oncall notified and ordered to give 5 units of insulin once. Nursing will continue to monitor.

## 2015-07-02 NOTE — Progress Notes (Signed)
Patient's blood sugar decrease to 108. MD on call informed and ordered to discontinue insulin drip and switch to CBG monitoring 3x daily with meals with SSI. Will continue to monitor.

## 2015-07-03 ENCOUNTER — Inpatient Hospital Stay (HOSPITAL_COMMUNITY): Payer: Commercial Managed Care - HMO

## 2015-07-03 DIAGNOSIS — R1314 Dysphagia, pharyngoesophageal phase: Secondary | ICD-10-CM

## 2015-07-03 LAB — CBC
HEMATOCRIT: 31.8 % — AB (ref 36.0–46.0)
HEMOGLOBIN: 9.7 g/dL — AB (ref 12.0–15.0)
MCH: 23 pg — AB (ref 26.0–34.0)
MCHC: 30.5 g/dL (ref 30.0–36.0)
MCV: 75.4 fL — AB (ref 78.0–100.0)
PLATELETS: 263 10*3/uL (ref 150–400)
RBC: 4.22 MIL/uL (ref 3.87–5.11)
RDW: 18.1 % — AB (ref 11.5–15.5)
WBC: 7.6 10*3/uL (ref 4.0–10.5)

## 2015-07-03 LAB — GLUCOSE, CAPILLARY
GLUCOSE-CAPILLARY: 115 mg/dL — AB (ref 65–99)
GLUCOSE-CAPILLARY: 159 mg/dL — AB (ref 65–99)
Glucose-Capillary: 131 mg/dL — ABNORMAL HIGH (ref 65–99)
Glucose-Capillary: 202 mg/dL — ABNORMAL HIGH (ref 65–99)
Glucose-Capillary: 32 mg/dL — CL (ref 65–99)

## 2015-07-03 LAB — PROTIME-INR
INR: 1.87 — AB (ref 0.00–1.49)
Prothrombin Time: 21.4 seconds — ABNORMAL HIGH (ref 11.6–15.2)

## 2015-07-03 MED ORDER — HEPARIN (PORCINE) IN NACL 100-0.45 UNIT/ML-% IJ SOLN
1100.0000 [IU]/h | INTRAMUSCULAR | Status: AC
  Administered 2015-07-03: 1100 [IU]/h via INTRAVENOUS
  Filled 2015-07-03: qty 250

## 2015-07-03 NOTE — Progress Notes (Signed)
Informed Dr Gwenlyn Perking earlier this afternoon via phone - 4 lab techs have been unsuccessful with venipuncture access to draw labs. Additionally, per lab techs, patient does not want her feet used for venipuncture access. Discussed strategies to address. Then pharmacist informed this RN important to draw PT / INR ASAP r/t cardiac status. Later, the CN discussed with pharmacist, IV team RN with Dr. Gwenlyn Perking consulted via phone in real time - options for drawing labs; with recent high lab value for her PT/ INR of 07/02/15, risk associated with inserting a central line. Dr. Gwenlyn Perking will provide further instructions.

## 2015-07-03 NOTE — Progress Notes (Signed)
   07/03/15 1900  Clinical Encounter Type  Visited With Patient;Health care provider  Visit Type Initial;Pre-op  Referral From Nurse  Consult/Referral To Chaplain  Spiritual Encounters  Spiritual Needs Prayer  Stress Factors  Patient Stress Factors Family relationships;Major life changes;Other (Comment) (grandmother in hospice; mother unable to visit watching gran)  [facility] completing spiritual care consult for pre-surgery prayer; pt awake and alert eating dinner; visit was brief but pt anxious for surgery and worried about post-op pain.

## 2015-07-03 NOTE — Progress Notes (Signed)
[facility] PROGRESS NOTE  Madison [name] KKX:[id number] DOB: [birth year] DOA: 07/01/2015 PCP: [name], NP  Assessment/Plan: Left trimalleolar ankle fracture: Mechanical fall. Patient has had no recent illness, dizziness or falls prior to this one. -Orthopedic surgery consulted. Fracture reduced. Plans to do surgery Monday. -Cardiology consulted for preop clearance and management of anticoagulation and antiplatelets therapy. 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 Romero Belling as an outpatient -DKA resolved now after IV insulin and IVF's -Anion gap is 13, bicarbonate 20. -will use lantus BID and SSI -CBG's stable  Mechanical aortic valve replacement -On chronic Coumadin therapy for this and a history of 5 strokes -Have requested a heparin drip per pharmacy. 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 -will continue B-blocker  -holding ASA, plavix and coumadin for surgery -pharmacy to dose heparin -after surgery will resume triple anticoagulation therapy   Essential hypertension -patient with soft BP on presentation -Will continue holding lisinopril for now; Continue beta blocker. -continue IVF's -BP better  Chronic kidney disease stage III -improved after IVF's and holding ACE inhibitors  -will monitor  Rheumatoid arthritis -Continue methotrexate   Code Status: Full Family Communication: no family at bedside   Disposition Plan: to be determine    Consultants:  Orthopedic service (Dr. Roda Shutters)  Procedures:  Left ankle surgery planned for 07/04/15  Antibiotics:  None   HPI/Subjective: Afebrile, no CP, no SOB. Reported pain has been bothering her. Unable to get any blood work done today due to difficult stick. Last INR in the 4 range   Objective: Filed Vitals:   07/03/15 1319  BP: 116/54  Pulse: 78  Temp: 98.4 F (36.9 C)  Resp: 16    Intake/Output Summary (Last 24 hours) at 07/03/15 1617 Last data filed at 07/02/15 1800  Gross per 24 hour  Intake    240 ml  Output      0 ml  Net    240 ml   There were no vitals filed for this visit.  Exam:   General:  Feeling ok. Complaining of pain in her left foot. No CP, no abd pain, no nausea or vomiting. Afebrile  Cardiovascular: S1 and S2, no rubs or gallops  Respiratory: CTA bilaterally  Abdomen: soft, NT, ND, positive BS  Musculoskeletal: no cyanosis or clubbing. Decrease range of motion on her foot due to pain  Data Reviewed: Basic Metabolic Panel:  Recent Labs Lab 07/01/15 1448  07/02/15 0439 07/02/15 0800 07/02/15 1222 07/02/15 1642 07/02/15 2028  NA 133*  < > 131* 132* 135 134* 136  K 6.3*  < > 4.8 4.1 4.5 3.9 3.8  CL 98*  < > 102 104 103 103 103  CO2 15*  < > 17* 22 19* 23 25  GLUCOSE 624*  < > 313* 191* 259* 160* 106*  BUN 30*  < > 21* 18 15 12 9   CREATININE 1.56*  < > 1.28* 1.06* 1.16* 1.02* 0.97  CALCIUM 8.9  < > 8.3* 8.3* 8.0* 8.5* 8.4*  MG 1.9  --   --   --   --   --   --   < > = values in this interval not displayed.  CBC:  Recent Labs Lab 07/01/15 1025 07/02/15 0439  WBC 12.2* 11.6*  NEUTROABS 10.7*  --   HGB 9.5* 8.0*  HCT 30.4* 26.5*  MCV 74.9* 75.5*  PLT 234 254   Cardiac Enzymes:  Recent Labs Lab 07/01/15 1025  CKTOTAL 42   ProBNP (last 3 results)  Recent Labs  09/30/14 1431  PROBNP 794.3*    CBG:  Recent Labs Lab 07/02/15 1125 07/02/15 1638 07/02/15 2133  07/03/15 0619 07/03/15 1131  GLUCAP 209* 148* 111* 131* 202*    Recent Results (from the past 240 hour(s))  MRSA PCR Screening     Status: Abnormal   Collection Time: 07/01/15  2:48 PM  Result Value Ref Range Status   MRSA by PCR POSITIVE (A) NEGATIVE Final    Comment:        The GeneXpert MRSA Assay (FDA approved for NASAL specimens only), is one component of a comprehensive MRSA colonization surveillance program. It is not intended to diagnose MRSA infection nor to guide or monitor treatment for MRSA infections. RESULT CALLED TO, READ BACK BY AND VERIFIED WITH: Charolotte Eke RN 17:00 07/01/15 ([email or web address])      Studies: No results found.  Scheduled Meds: . baclofen  10 mg Oral BID  . Chlorhexidine Gluconate Cloth  6 each Topical Q0600  . clonazePAM  0.5 mg Oral BID  . darifenacin  7.5 mg Oral Daily  . docusate sodium  100 mg Oral BID  . escitalopram  20 mg Oral Daily  . insulin aspart  0-24 Units Subcutaneous TID WC  . insulin detemir  18 Units Subcutaneous BID  . methotrexate  2.5 mg Oral Once per day on Mon Fri  . metoprolol tartrate  25 mg Oral Daily  . mupirocin ointment  1 application Nasal BID  . pantoprazole  40 mg Oral Daily  . pravastatin  20 mg Oral Daily  . sodium chloride  3 mL Intravenous Q12H   Continuous Infusions: . sodium chloride 75 mL/hr at 07/02/15 1409    Principal Problem:   Closed left ankle fracture Active Problems:   Rheumatoid arthritis   Chronic anticoagulation   Dysphagia, pharyngoesophageal phase   Spastic hemiplegia affecting nondominant side   Supratherapeutic INR   S/P aortic valve replacement   Essential hypertension   DKA, type 2   Trimalleolar fracture of left ankle   DKA, type 2, not at goal   Hyperkalemia   Acute kidney injury    Time spent: 30 minutes    Vassie Loll  [facility] Pager [phone]. If 7PM-7AM, please contact night-coverage at [email or web address], password [facility] 07/03/2015, 4:17 PM  LOS: 2 days

## 2015-07-03 NOTE — Progress Notes (Signed)
ANTICOAGULATION CONSULT NOTE - Initial Consult  Pharmacy Consult for heparin Indication: mechanical aortic valve  Allergies  Allergen Reactions  . Celebrex [Celecoxib] Rash  . Detrol [Tolterodine] Hives    Patient Measurements:   Heparin Dosing Weight: 74 kg  Vital Signs: Temp: 98.4 F (36.9 C) (08/14 1911) Temp Source: Axillary (08/14 1911) BP: 145/64 mmHg (08/14 1911) Pulse Rate: 85 (08/14 1911)  Labs:  Recent Labs  07/01/15 1025  07/02/15 0439  07/02/15 1222 07/02/15 1642 07/02/15 2028 07/03/15 1920  HGB 9.5*  --  8.0*  --   --   --   --  9.7*  HCT 30.4*  --  26.5*  --   --   --   --  31.8*  PLT 234  --  254  --   --   --   --  263  LABPROT 29.4*  --  41.0*  --   --   --   --  21.4*  INR 2.84*  --  4.43*  --   --   --   --  1.87*  CREATININE 1.44*  < > 1.28*  < > 1.16* 1.02* 0.97  --   CKTOTAL 42  --   --   --   --   --   --   --   < > = values in this interval not displayed.  CrCl cannot be calculated (Unknown ideal weight.).   Medical History: Past Medical History  Diagnosis Date  . CAD (coronary artery disease)   . Obesity   . Hypercholesteremia   . HTN (hypertension)   . Dyslipidemia   . Aortic stenosis   . Thrombophlebitis   . Heart murmur   . Myocardial infarction 12/2014  . Pneumonia 11/2014; 12/2014  . GERD (gastroesophageal reflux disease)   . 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 Anxiety   . Depression   . Allergy   . CHF (congestive heart failure)   . Rheumatoid arthritis(714.0)     "hands" (07/01/2015)  . Diabetes dx'd 1982    "went straight to insulin"    Medications:  Scheduled:  . baclofen  10 mg Oral BID  . Chlorhexidine Gluconate Cloth  6 each Topical Q0600  . clonazePAM  0.5 mg Oral BID  . darifenacin  7.5 mg Oral Daily  . docusate sodium  100 mg Oral BID  . escitalopram  20 mg Oral Daily  . insulin aspart  0-24 Units Subcutaneous TID WC  . insulin  detemir  18 Units Subcutaneous BID  . methotrexate  2.5 mg Oral Once per day on Mon Fri  . metoprolol tartrate  25 mg Oral Daily  . mupirocin ointment  1 application Nasal BID  . pantoprazole  40 mg Oral Daily  . pravastatin  20 mg Oral Daily  . sodium chloride  3 mL Intravenous Q12H   Infusions:  . sodium chloride 75 mL/hr at 07/02/15 1409    Assessment: 54yo female with history prosthetic aortic valve on warfarin, DM2, RA and CAD presents after mechanical fall and ankle fracture. Pharmacy is consulted to dose heparin for mechanical aortic valve when INR < 2.5.  INR now 1.87  Goal of Therapy:  Heparin level 0.3-0.7 Monitor platelets by anticoagulation protocol: Yes   Plan:  - Start heparin at 1100 units/hr. No bolus.  Stop at 0845 on 08/15 anticipating for surgery - 6hr heparin level after  drip has started  Jaydee Ingman, Tsz-Yin 07/03/2015,8:05 PM

## 2015-07-04 ENCOUNTER — Ambulatory Visit: Payer: Commercial Managed Care - HMO

## 2015-07-04 ENCOUNTER — Encounter (HOSPITAL_COMMUNITY): Payer: Self-pay | Admitting: Anesthesiology

## 2015-07-04 ENCOUNTER — Encounter (HOSPITAL_COMMUNITY)
Admission: EM | Disposition: A | Discharge status: Skilled Nursing Facility | Payer: Self-pay | Source: Home / Self Care | Attending: Internal Medicine

## 2015-07-04 ENCOUNTER — Inpatient Hospital Stay (HOSPITAL_COMMUNITY): Payer: Commercial Managed Care - HMO | Admitting: Anesthesiology

## 2015-07-04 ENCOUNTER — Ambulatory Visit (HOSPITAL_COMMUNITY): Payer: Medicaid Other

## 2015-07-04 ENCOUNTER — Inpatient Hospital Stay (HOSPITAL_COMMUNITY): Payer: Commercial Managed Care - HMO

## 2015-07-04 DIAGNOSIS — S82852D Displaced trimalleolar fracture of left lower leg, subsequent encounter for closed fracture with routine healing: Secondary | ICD-10-CM

## 2015-07-04 HISTORY — PX: ORIF ANKLE FRACTURE: SHX5408

## 2015-07-04 LAB — BASIC METABOLIC PANEL
ANION GAP: 8 (ref 5–15)
BUN: 6 mg/dL (ref 6–20)
CHLORIDE: 107 mmol/L (ref 101–111)
CO2: 24 mmol/L (ref 22–32)
CREATININE: 0.67 mg/dL (ref 0.44–1.00)
Calcium: 8.5 mg/dL — ABNORMAL LOW (ref 8.9–10.3)
GFR calc non Af Amer: >60 mL/min (ref >60)
Glucose, Bld: 156 mg/dL — ABNORMAL HIGH (ref 65–99)
Potassium: 3.7 mmol/L (ref 3.5–5.1)
Sodium: 139 mmol/L (ref 135–145)

## 2015-07-04 LAB — POCT I-STAT 4, (NA,K, GLUC, HGB,HCT)
GLUCOSE: 59 mg/dL — AB (ref 65–99)
GLUCOSE: 107 mg/dL — AB (ref 65–99)
HCT: 33 % — ABNORMAL LOW (ref 36.0–46.0)
HEMATOCRIT: 28 % — AB (ref 36.0–46.0)
HEMOGLOBIN: 11.2 g/dL — AB (ref 12.0–15.0)
Hemoglobin: 9.5 g/dL — ABNORMAL LOW (ref 12.0–15.0)
POTASSIUM: 3.6 mmol/L (ref 3.5–5.1)
Potassium: 3.5 mmol/L (ref 3.5–5.1)
SODIUM: 140 mmol/L (ref 135–145)
Sodium: 140 mmol/L (ref 135–145)

## 2015-07-04 LAB — GLUCOSE, CAPILLARY
GLUCOSE-CAPILLARY: 155 mg/dL — AB (ref 65–99)
GLUCOSE-CAPILLARY: 256 mg/dL — AB (ref 65–99)
GLUCOSE-CAPILLARY: 105 mg/dL — AB (ref 65–99)
GLUCOSE-CAPILLARY: 91 mg/dL (ref 65–99)
Glucose-Capillary: 116 mg/dL — ABNORMAL HIGH (ref 65–99)
Glucose-Capillary: 28 mg/dL — CL (ref 65–99)
Glucose-Capillary: 59 mg/dL — ABNORMAL LOW (ref 65–99)

## 2015-07-04 LAB — PROTIME-INR
INR: 1.74 — AB (ref 0.00–1.49)
Prothrombin Time: 20.3 seconds — ABNORMAL HIGH (ref 11.6–15.2)

## 2015-07-04 LAB — CBC
HEMATOCRIT: 25.6 % — AB (ref 36.0–46.0)
HEMOGLOBIN: 7.8 g/dL — AB (ref 12.0–15.0)
MCH: 23.1 pg — ABNORMAL LOW (ref 26.0–34.0)
MCHC: 30.5 g/dL (ref 30.0–36.0)
MCV: 75.7 fL — ABNORMAL LOW (ref 78.0–100.0)
Platelets: 222 10*3/uL (ref 150–400)
RBC: 3.38 MIL/uL — ABNORMAL LOW (ref 3.87–5.11)
RDW: 18.2 % — ABNORMAL HIGH (ref 11.5–15.5)
WBC: 7.2 10*3/uL (ref 4.0–10.5)

## 2015-07-04 LAB — PREPARE RBC (CROSSMATCH)

## 2015-07-04 LAB — HEPARIN LEVEL (UNFRACTIONATED): Heparin Unfractionated: 0.38 IU/mL (ref 0.30–0.70)

## 2015-07-04 SURGERY — OPEN REDUCTION INTERNAL FIXATION (ORIF) ANKLE FRACTURE
Anesthesia: General | Site: Ankle | Laterality: Left

## 2015-07-04 MED ORDER — 0.9 % SODIUM CHLORIDE (POUR BTL) OPTIME
TOPICAL | Status: DC | PRN
  Administered 2015-07-04: 1000 mL

## 2015-07-04 MED ORDER — FENTANYL CITRATE (PF) 100 MCG/2ML IJ SOLN: 25.0000 ug | INTRAMUSCULAR | Status: DC | PRN

## 2015-07-04 MED ORDER — SODIUM CHLORIDE 0.9 % IV SOLN: Freq: Once | INTRAVENOUS | Status: DC

## 2015-07-04 MED ORDER — NEOSTIGMINE METHYLSULFATE 10 MG/10ML IV SOLN
INTRAVENOUS | Status: DC | PRN
  Administered 2015-07-04: 3 mg via INTRAVENOUS

## 2015-07-04 MED ORDER — FENTANYL CITRATE (PF) 100 MCG/2ML IJ SOLN
INTRAMUSCULAR | Status: DC | PRN
  Administered 2015-07-04 (×2): 100 ug via INTRAVENOUS

## 2015-07-04 MED ORDER — PROPOFOL 10 MG/ML IV BOLUS
INTRAVENOUS | Status: AC
  Filled 2015-07-04: qty 20

## 2015-07-04 MED ORDER — ONDANSETRON HCL 4 MG/2ML IJ SOLN
INTRAMUSCULAR | Status: DC | PRN
  Administered 2015-07-04: 4 mg via INTRAVENOUS

## 2015-07-04 MED ORDER — LACTATED RINGERS IV SOLN
INTRAVENOUS | Status: DC
  Administered 2015-07-04: 15:00:00 via INTRAVENOUS

## 2015-07-04 MED ORDER — ROCURONIUM BROMIDE 100 MG/10ML IV SOLN
INTRAVENOUS | Status: DC | PRN
  Administered 2015-07-04: 40 mg via INTRAVENOUS

## 2015-07-04 MED ORDER — DEXTROSE 50 % IV SOLN
INTRAVENOUS | Status: DC | PRN
  Administered 2015-07-04: 50 g via INTRAVENOUS

## 2015-07-04 MED ORDER — MIDAZOLAM HCL 5 MG/5ML IJ SOLN
INTRAMUSCULAR | Status: DC | PRN
  Administered 2015-07-04: 1 mg via INTRAVENOUS

## 2015-07-04 MED ORDER — VITAMIN K1 10 MG/ML IJ SOLN
10.0000 mg | INTRAVENOUS | Status: AC
  Administered 2015-07-04: 10 mg via INTRAVENOUS
  Filled 2015-07-04: qty 1

## 2015-07-04 MED ORDER — EPHEDRINE SULFATE 50 MG/ML IJ SOLN
INTRAMUSCULAR | Status: AC
  Filled 2015-07-04: qty 1

## 2015-07-04 MED ORDER — MORPHINE SULFATE (PF) 2 MG/ML IV SOLN: 2.0000 mg | INTRAVENOUS | Status: DC | PRN

## 2015-07-04 MED ORDER — DEXTROSE 50 % IV SOLN
INTRAVENOUS | Status: AC
  Filled 2015-07-04: qty 50

## 2015-07-04 MED ORDER — ROCURONIUM BROMIDE 50 MG/5ML IV SOLN
INTRAVENOUS | Status: AC
  Filled 2015-07-04: qty 1

## 2015-07-04 MED ORDER — DEXTROSE 50 % IV SOLN
INTRAVENOUS | Status: AC
Start: 2015-07-04 — End: 2015-07-05
  Filled 2015-07-04: qty 50

## 2015-07-04 MED ORDER — GLYCOPYRROLATE 0.2 MG/ML IJ SOLN
INTRAMUSCULAR | Status: DC | PRN
  Administered 2015-07-04: 0.6 mg via INTRAVENOUS

## 2015-07-04 MED ORDER — HYDROCODONE-ACETAMINOPHEN 7.5-325 MG PO TABS: 1.0000 | ORAL_TABLET | Freq: Four times a day (QID) | ORAL | Status: DC | PRN

## 2015-07-04 MED ORDER — LIDOCAINE HCL (CARDIAC) 20 MG/ML IV SOLN
INTRAVENOUS | Status: DC | PRN
  Administered 2015-07-04: 50 mg via INTRAVENOUS

## 2015-07-04 MED ORDER — FENTANYL CITRATE (PF) 250 MCG/5ML IJ SOLN
INTRAMUSCULAR | Status: AC
  Filled 2015-07-04: qty 5

## 2015-07-04 MED ORDER — LIDOCAINE HCL (CARDIAC) 20 MG/ML IV SOLN
INTRAVENOUS | Status: AC
  Filled 2015-07-04: qty 10

## 2015-07-04 MED ORDER — ALBUMIN HUMAN 5 % IV SOLN
INTRAVENOUS | Status: DC | PRN
  Administered 2015-07-04: 16:00:00 via INTRAVENOUS

## 2015-07-04 MED ORDER — NEOSTIGMINE METHYLSULFATE 10 MG/10ML IV SOLN
INTRAVENOUS | Status: AC
  Filled 2015-07-04: qty 1

## 2015-07-04 MED ORDER — LACTATED RINGERS IV SOLN
INTRAVENOUS | Status: DC | PRN
  Administered 2015-07-04: 15:00:00 via INTRAVENOUS

## 2015-07-04 MED ORDER — ONDANSETRON HCL 4 MG/2ML IJ SOLN
INTRAMUSCULAR | Status: AC
  Filled 2015-07-04: qty 2

## 2015-07-04 MED ORDER — BUPIVACAINE HCL (PF) 0.25 % IJ SOLN
INTRAMUSCULAR | Status: AC
  Filled 2015-07-04: qty 30

## 2015-07-04 MED ORDER — PROPOFOL 10 MG/ML IV BOLUS
INTRAVENOUS | Status: DC | PRN
  Administered 2015-07-04: 120 mg via INTRAVENOUS

## 2015-07-04 MED ORDER — PHENYLEPHRINE 40 MCG/ML (10ML) SYRINGE FOR IV PUSH (FOR BLOOD PRESSURE SUPPORT)
PREFILLED_SYRINGE | INTRAVENOUS | Status: AC
  Filled 2015-07-04: qty 10

## 2015-07-04 MED ORDER — DEXTROSE 50 % IV SOLN
25.0000 mL | Freq: Once | INTRAVENOUS | Status: AC
  Administered 2015-07-04: 25 mL via INTRAVENOUS

## 2015-07-04 MED ORDER — HEPARIN (PORCINE) IN NACL 100-0.45 UNIT/ML-% IJ SOLN
1150.0000 [IU]/h | INTRAMUSCULAR | Status: DC
  Administered 2015-07-05 – 2015-07-06 (×3): 1150 [IU]/h via INTRAVENOUS
  Filled 2015-07-04 (×5): qty 250

## 2015-07-04 MED ORDER — BUPIVACAINE HCL (PF) 0.25 % IJ SOLN
INTRAMUSCULAR | Status: DC | PRN
  Administered 2015-07-04: 20 mL

## 2015-07-04 MED ORDER — HYDROCODONE-ACETAMINOPHEN 7.5-325 MG PO TABS
1.0000 | ORAL_TABLET | ORAL | Status: DC | PRN
  Administered 2015-07-07 – 2015-07-08 (×3): 2 via ORAL
  Filled 2015-07-04 (×3): qty 2

## 2015-07-04 MED ORDER — MIDAZOLAM HCL 2 MG/2ML IJ SOLN
INTRAMUSCULAR | Status: AC
  Filled 2015-07-04: qty 4

## 2015-07-04 MED ORDER — GLYCOPYRROLATE 0.2 MG/ML IJ SOLN
INTRAMUSCULAR | Status: AC
  Filled 2015-07-04: qty 3

## 2015-07-04 MED ORDER — CEFAZOLIN SODIUM-DEXTROSE 2-3 GM-% IV SOLR
INTRAVENOUS | Status: DC | PRN
  Administered 2015-07-04: 2 g via INTRAVENOUS

## 2015-07-04 MED ORDER — PHENYLEPHRINE HCL 10 MG/ML IJ SOLN
INTRAMUSCULAR | Status: DC | PRN
  Administered 2015-07-04: 80 ug via INTRAVENOUS

## 2015-07-04 SURGICAL SUPPLY — 68 items
BANDAGE ELASTIC 4 VELCRO ST LF (GAUZE/BANDAGES/DRESSINGS) ×3 IMPLANT
BANDAGE ELASTIC 6 VELCRO ST LF (GAUZE/BANDAGES/DRESSINGS) ×3 IMPLANT
BANDAGE ESMARK 6X9 LF (GAUZE/BANDAGES/DRESSINGS) IMPLANT
BLADE SURG 15 STRL LF DISP TIS (BLADE) ×1 IMPLANT
BLADE SURG 15 STRL SS (BLADE) ×2
BNDG COHESIVE 4X5 TAN STRL (GAUZE/BANDAGES/DRESSINGS) ×6 IMPLANT
BNDG COHESIVE 6X5 TAN STRL LF (GAUZE/BANDAGES/DRESSINGS) ×3 IMPLANT
BNDG ESMARK 6X9 LF (GAUZE/BANDAGES/DRESSINGS)
CANISTER SUCT 3000ML PPV (MISCELLANEOUS) IMPLANT
COVER SURGICAL LIGHT HANDLE (MISCELLANEOUS) ×3 IMPLANT
CUFF TOURNIQUET SINGLE 34IN LL (TOURNIQUET CUFF) ×3 IMPLANT
CUFF TOURNIQUET SINGLE 44IN (TOURNIQUET CUFF) IMPLANT
DECANTER SPIKE VIAL GLASS SM (MISCELLANEOUS) ×3 IMPLANT
DRAPE C-ARM 42X72 X-RAY (DRAPES) ×3 IMPLANT
DRAPE C-ARMOR (DRAPES) ×3 IMPLANT
DRAPE IMP U-DRAPE 54X76 (DRAPES) IMPLANT
DRAPE INCISE IOBAN 66X45 STRL (DRAPES) ×3 IMPLANT
DRAPE SURG 17X23 STRL (DRAPES) ×6 IMPLANT
DRAPE U-SHAPE 47X51 STRL (DRAPES) ×3 IMPLANT
DRILL 2.6X122MM WL AO SHAFT (BIT) ×3 IMPLANT
DRSG PAD ABDOMINAL 8X10 ST (GAUZE/BANDAGES/DRESSINGS) ×6 IMPLANT
DURAPREP 26ML APPLICATOR (WOUND CARE) ×6 IMPLANT
ELECT CAUTERY BLADE 6.4 (BLADE) ×3 IMPLANT
ELECT REM PT RETURN 9FT ADLT (ELECTROSURGICAL) ×3
ELECTRODE REM PT RTRN 9FT ADLT (ELECTROSURGICAL) ×1 IMPLANT
FACESHIELD WRAPAROUND (MASK) IMPLANT
GAUZE SPONGE 4X4 12PLY STRL (GAUZE/BANDAGES/DRESSINGS) ×3 IMPLANT
GAUZE XEROFORM 5X9 LF (GAUZE/BANDAGES/DRESSINGS) ×3 IMPLANT
GLOVE NEODERM STRL 7.5 LF PF (GLOVE) ×1 IMPLANT
GLOVE SURG NEODERM 7.5  LF PF (GLOVE) ×2
GLOVE SURG SYN 7.5  E (GLOVE) ×4
GLOVE SURG SYN 7.5 E (GLOVE) ×2 IMPLANT
GOWN STRL REIN XL XLG (GOWN DISPOSABLE) ×3 IMPLANT
K-WIRE ORTHOPEDIC 1.4X150L (WIRE) ×6
KIT BASIN OR (CUSTOM PROCEDURE TRAY) ×3 IMPLANT
KIT ROOM TURNOVER OR (KITS) ×3 IMPLANT
KWIRE ORTHOPEDIC 1.4X150L (WIRE) ×2 IMPLANT
MANIFOLD NEPTUNE WASTE (CANNULA) ×3 IMPLANT
NEEDLE HYPO 25GX1X1/2 BEV (NEEDLE) ×3 IMPLANT
NS IRRIG 1000ML POUR BTL (IV SOLUTION) ×3 IMPLANT
PACK ORTHO EXTREMITY (CUSTOM PROCEDURE TRAY) ×3 IMPLANT
PAD ARMBOARD 7.5X6 YLW CONV (MISCELLANEOUS) ×6 IMPLANT
PAD CAST 3X4 CTTN HI CHSV (CAST SUPPLIES) ×2 IMPLANT
PADDING CAST COTTON 3X4 STRL (CAST SUPPLIES) ×4
PADDING CAST COTTON 6X4 STRL (CAST SUPPLIES) ×3 IMPLANT
PLATE DISTAL FIBULA 3HOLE (Plate) ×3 IMPLANT
SCREW BONE 14MMX3.5MM (Screw) ×3 IMPLANT
SCREW BONE NON-LCKING 3.5X12MM (Screw) ×3 IMPLANT
SCREW CANNULATED 4.0X36MM FT (Screw) ×3 IMPLANT
SCREW CANNULATED 4.0X36MM PT (Screw) ×3 IMPLANT
SCREW LOCK 3.5X10MM (Screw) ×3 IMPLANT
SCREW LOCK 3.5X14 (Screw) ×3 IMPLANT
SCREW LOCKING 3.5X12 (Screw) ×9 IMPLANT
SCREW LOCKING 3.5X16MM (Screw) ×6 IMPLANT
SCREW NONLOCK 22MM (Screw) ×3 IMPLANT
SPLINT FIBERGLASS 4X30 (CAST SUPPLIES) ×3 IMPLANT
SPONGE GAUZE 4X4 12PLY STER LF (GAUZE/BANDAGES/DRESSINGS) ×3 IMPLANT
SPONGE LAP 18X18 X RAY DECT (DISPOSABLE) IMPLANT
SUCTION FRAZIER TIP 10 FR DISP (SUCTIONS) ×3 IMPLANT
SUT ETHILON 3 0 PS 1 (SUTURE) IMPLANT
SUT VIC AB 2-0 CT1 27 (SUTURE)
SUT VIC AB 2-0 CT1 TAPERPNT 27 (SUTURE) IMPLANT
SYR CONTROL 10ML LL (SYRINGE) ×3 IMPLANT
TOWEL OR 17X24 6PK STRL BLUE (TOWEL DISPOSABLE) ×3 IMPLANT
TOWEL OR 17X26 10 PK STRL BLUE (TOWEL DISPOSABLE) ×6 IMPLANT
TUBE CONNECTING 12'X1/4 (SUCTIONS) ×1
TUBE CONNECTING 12X1/4 (SUCTIONS) ×2 IMPLANT
WATER STERILE IRR 1000ML POUR (IV SOLUTION) IMPLANT

## 2015-07-04 NOTE — Anesthesia Procedure Notes (Signed)
Procedure Name: Intubation Date/Time: 07/04/2015 3:37 PM Performed by: Eligha Bridegroom Pre-anesthesia Checklist: Patient identified, Emergency Drugs available, Suction available and Patient being monitored Patient Re-evaluated:Patient Re-evaluated prior to inductionOxygen Delivery Method: Circle system utilized Preoxygenation: Pre-oxygenation with 100% oxygen Intubation Type: IV induction Ventilation: Mask ventilation without difficulty Laryngoscope Size: Mac and 3 Grade View: Grade I Tube size: 7.0 mm Airway Equipment and Method: Stylet and LTA kit utilized Placement Confirmation: ETT inserted through vocal cords under direct vision,  positive ETCO2 and CO2 detector Secured at: 21 cm Tube secured with: Tape Dental Injury: Teeth and Oropharynx as per pre-operative assessment

## 2015-07-04 NOTE — H&P (Signed)
H&P update  The surgical history has been reviewed and remains accurate without interval change.  The patient was re-examined and patient's physiologic condition has not changed significantly in the last 30 days. The condition still exists that makes this procedure necessary. The treatment plan remains the same, without new options for care.  No new pharmacological allergies or types of therapy has been initiated that would change the plan or the appropriateness of the plan.  The patient and/or family understand the potential benefits and risks.  Mayra Reel, MD 07/04/2015 7:01 AM

## 2015-07-04 NOTE — Progress Notes (Signed)
Dr. Randa Evens notified of CBG orders received.

## 2015-07-04 NOTE — Anesthesia Postprocedure Evaluation (Signed)
  Anesthesia Post-op Note  Patient: Madison [name]  Procedure(s) Performed: Procedure(s) (LRB): OPEN REDUCTION INTERNAL FIXATION (ORIF)  TRIMAL ANKLE FRACTURE (Left)  Patient Location: PACU  Anesthesia Type: General  Level of Consciousness: awake and alert   Airway and Oxygen Therapy: Patient Spontanous Breathing  Post-op Pain: mild  Post-op Assessment: Post-op Vital signs reviewed, Patient's Cardiovascular Status Stable, Respiratory Function Stable, Patent Airway and No signs of Nausea or vomiting  Last Vitals:  Filed Vitals:   07/04/15 1800  BP: 136/55  Pulse: 74  Temp:   Resp: 20    Post-op Vital Signs: stable   Complications: No apparent anesthesia complications Follow up PACU glucose is 105, stable for discharge

## 2015-07-04 NOTE — Progress Notes (Signed)
ANTICOAGULATION CONSULT NOTE - Follow-up Consult  Pharmacy Consult for heparin Indication: mechanical aortic valve  Allergies  Allergen Reactions  . Celebrex [Celecoxib] Rash  . Detrol [Tolterodine] Hives    Patient Measurements:   Heparin Dosing Weight: 74 kg  Vital Signs: Temp: 98.4 F (36.9 C) (08/14 1911) Temp Source: Axillary (08/14 1911) BP: 145/64 mmHg (08/14 1911) Pulse Rate: 85 (08/14 1911)  Labs:  Recent Labs  07/01/15 1025  07/02/15 0439  07/02/15 1642 07/02/15 2028 07/03/15 1920 07/04/15 0242  HGB 9.5*  --  8.0*  --   --   --  9.7* 7.8*  HCT 30.4*  --  26.5*  --   --   --  31.8* 25.6*  PLT 234  --  254  --   --   --  263 222  LABPROT 29.4*  --  41.0*  --   --   --  21.4* 20.3*  INR 2.84*  --  4.43*  --   --   --  1.87* 1.74*  HEPARINUNFRC  --   --   --   --   --   --   --  0.38  CREATININE 1.44*  < > 1.28*  < > 1.02* 0.97  --  0.67  CKTOTAL 42  --   --   --   --   --   --   --   < > = values in this interval not displayed.  CrCl cannot be calculated (Unknown ideal weight.).  Assessment: 54yo female with history prosthetic aortic valve on warfarin, DM2, RA and CAD presents after mechanical fall and ankle fracture. Coumadin being held for surgery - INR down to 1.74. Pt on heparin bridge. Heparin level therapeutic. Noted plan to d/c heparin gtt at 0845 today for surgery this afternoon. Hgb down to 7.8, plt ok. No overt bleeding noted.  Goal of Therapy:  Heparin level 0.3-0.7 Monitor platelets by anticoagulation protocol: Yes   Plan:  - Continue heparin at 1100 units/hr. Stops today at 0845 for surgery this afternoon - F/u restart heparin post-op  Christoper Fabian, PharmD, BCPS Clinical pharmacist, pager [phone] 07/04/2015,5:06 AM

## 2015-07-04 NOTE — Progress Notes (Signed)
ANTICOAGULATION CONSULT NOTE - Follow-up Consult  Pharmacy Consult for Heparin Indication: mechanical aortic valve  Allergies  Allergen Reactions  . Celebrex [Celecoxib] Rash  . Detrol [Tolterodine] Hives    Patient Measurements:   Heparin Dosing Weight: 74 kg  Vital Signs: Temp: 98.6 F (37 C) (08/15 1848) Temp Source: Oral (08/15 1848) BP: 134/59 mmHg (08/15 1848) Pulse Rate: 77 (08/15 1848)  Labs:  Recent Labs  07/02/15 0439  07/02/15 1642 07/02/15 2028 07/03/15 1920 07/04/15 0242 07/04/15 1502 07/04/15 1620  HGB 8.0*  --   --   --  9.7* 7.8* 11.2* 9.5*  HCT 26.5*  --   --   --  31.8* 25.6* 33.0* 28.0*  PLT 254  --   --   --  263 222  --   --   LABPROT 41.0*  --   --   --  21.4* 20.3*  --   --   INR 4.43*  --   --   --  1.87* 1.74*  --   --   HEPARINUNFRC  --   --   --   --   --  0.38  --   --   CREATININE 1.28*  < > 1.02* 0.97  --  0.67  --   --   < > = values in this interval not displayed.  CrCl cannot be calculated (Unknown ideal weight.).  Assessment: 54yo female with history prosthetic aortic valve on warfarin, DM2, RA and CAD presents after mechanical fall and ankle fracture. Coumadin being held for surgery.  Now s/p ankle fracture repair and to resume heparin 8/16 at 8 am per Dr. Roda Shutters  Goal of Therapy:  Heparin level 0.3-0.7 Monitor platelets by anticoagulation protocol: Yes   Plan:  - Restart heparin at 1150 units/hr 8/16 at 8 am - Heparin level 8 hours after heparin starts - Daily heparin level, CBC  Thank you Okey Regal, PharmD [phone]  07/04/2015,7:09 PM

## 2015-07-04 NOTE — Op Note (Addendum)
Date of Surgery: 07/04/2015  INDICATIONS: Madison [name] is a 54 y.o.-year-old female who sustained a left ankle fracture; she was indicated for open reduction and internal fixation due to the displaced nature of the articular fracture and came to the operating room today for this procedure. The patient did consent to the procedure after discussion of the risks and benefits.  PREOPERATIVE DIAGNOSIS: left trimalleolar ankle fracture dislocation  POSTOPERATIVE DIAGNOSIS: Same.  PROCEDURE: Open treatment of left ankle fracture with internal fixation. Trimalleolar w/o fixation of posterior malleolus CPT 27822.   SURGEON: N. Glee Arvin, M.D.  ASSIST: Hart Carwin, RNFA.  ANESTHESIA:  general  TOURNIQUET TIME: less than 2 hrs  IV FLUIDS AND URINE: See anesthesia.  ESTIMATED BLOOD LOSS: minimal mL.  IMPLANTS: [employer] Variax 3 hole distal fibula plate  COMPLICATIONS: None.  DESCRIPTION OF PROCEDURE: The patient was brought to the operating room and placed supine on the operating table.  The patient had been signed prior to the procedure and this was documented. The patient had the anesthesia placed by the anesthesiologist.  A nonsterile tourniquet was placed on the upper thigh.  The prep verification and incision time-outs were performed to confirm that this was the correct patient, site, side and location. The patient had an SCD on the opposite lower extremity. The patient did receive antibiotics prior to the incision and was re-dosed during the procedure as needed at indicated intervals.  The patient had the lower extremity prepped and draped in the standard surgical fashion.  The extremity was exsanguinated using an esmarch bandage and the tourniquet was inflated to 300 mm Hg.  The distal fibula was palpated and a longitudinal incision was created. Full-thickness flaps were created. The distal fibula was exposed. Subperiosteal elevation was performed. The fracture was exposed. The fracture was  provisionally reduced. The fracture was near transverse so we were not able to place a lag screw. We placed a precontoured distal fibula plate and bridged the fracture with locking and nonlocking screws both proximally and distally. The fracture remained reduced. Orthogonal x-rays were taken to confirm appropriate reduction and placement of the hardware. We then turned our attention to the medial malleolus. A longitudinal incision over the distal medial malleolus was used. The saphenous neurovascular structures were identified and mobilized and protected. The fracture was exposed. Entrapped periosteum was removed from the fracture site. The fracture was then reduced with a dental pick. We then placed 2 parallel K wires across the fracture site. X-rays were taken to confirm appropriate placement. We then placed 2 cannulated screws over the K wires.  A partially-threaded screw was first advanced up the K wire to gain compression across the fracture and a second fully threaded screw was advanced over the second K wire. Final x-rays were then taken. A stress radiograph was performed and demonstrated no widening of the medial clear space or the syndesmosis. The wound was thoroughly irrigated. The tourniquet was deflated and hemostasis was obtained. The wound was closed in layer fashion using 2-0 Vicryls and 3-0 nylon. Sterile dressings were applied. The extremity was immobilized in a short-leg splint. The patient tolerated the procedure well and was extubated and transferred to the PACU in stable condition. All sponge counts were correct.  POSTOPERATIVE PLAN: Madison [name] will remain nonweightbearing on this leg for approximately 6 weeks; Madison [name] will return for suture removal in 2 weeks.  He will be immobilized in a short leg splint and then transitioned to a CAM walker at his first follow  up appointment.  Madison [name] will receive DVT prophylaxis based on other medications, activity level, and risk ratio of bleeding  to thrombosis.  Mayra Reel, MD [facility] [phone] 5:08 PM

## 2015-07-04 NOTE — Discharge Instructions (Signed)
1. Keep splint clean and dry 2. Elevate foot above level of the heart 3. Take warfarin to prevent blood clots 4. Take pain meds as needed 5. Strict non weight bearing to operative extremity

## 2015-07-04 NOTE — Anesthesia Preprocedure Evaluation (Addendum)
Anesthesia Evaluation  Patient identified by MRN, date of birth, ID band Patient awake    Reviewed: Allergy & Precautions, NPO status , Patient's Chart, lab work & pertinent test results  Airway Mallampati: II  TM Distance: >3 FB Neck ROM: Full    Dental   Pulmonary pneumonia -,  breath sounds clear to auscultation        Cardiovascular hypertension, + CAD, + Past MI and +CHF + Valvular Problems/Murmurs Rhythm:Regular     Neuro/Psych    GI/Hepatic Neg liver ROS, GERD-  ,  Endo/Other  diabetes  Renal/GU Renal disease     Musculoskeletal   Abdominal   Peds  Hematology   Anesthesia Other Findings   Reproductive/Obstetrics                           Anesthesia Physical Anesthesia Plan  ASA: IV  Anesthesia Plan: General   Post-op Pain Management:    Induction: Intravenous  Airway Management Planned: Oral ETT  Additional Equipment:   Intra-op Plan:   Post-operative Plan: Possible Post-op intubation/ventilation  Informed Consent: I have reviewed the patients History and Physical, chart, labs and discussed the procedure including the risks, benefits and alternatives for the proposed anesthesia with the patient or authorized representative who has indicated his/her understanding and acceptance.   Dental advisory given  Plan Discussed with: CRNA, Anesthesiologist and Surgeon  Anesthesia Plan Comments:        Anesthesia Quick Evaluation

## 2015-07-04 NOTE — Progress Notes (Signed)
[facility] PROGRESS NOTE  Madison [name] FXT:[id number] DOB: Sep 17, 1961 DOA: 07/01/2015 PCP: [name], NP  Assessment/Plan: Left trimalleolar ankle fracture: Mechanical fall. Patient has had no recent illness, dizziness or falls prior to this one. -Orthopedic surgery consulted. Fracture reduced. Plans to do surgery Monday. -Cardiology consulted for preop clearance and management of anticoagulation and antiplatelets therapy. 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 Romero Belling as an outpatient -DKA resolved now after IV insulin and IVF's -Anion gap is 13, bicarbonate 20. -will use lantus BID and SSI -CBG's stable  Mechanical aortic valve replacement -On chronic Coumadin therapy for this and a history of 5 strokes -Have requested a heparin drip per pharmacy. 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 -will continue B-blocker  -holding ASA, plavix and coumadin for surgery -pharmacy to dose heparin -after surgery will resume triple anticoagulation therapy   Essential hypertension -patient with soft BP on presentation -Will continue holding lisinopril for now; Continue beta blocker. -continue IVF's -BP better  Chronic kidney disease stage III -improved after IVF's and holding ACE inhibitors  -will monitor and follow trend after sx  Rheumatoid arthritis -Continue methotrexate   Code Status: Full Family Communication:  no family at bedside  Disposition Plan: to be determine    Consultants:  Orthopedic service (Dr. Roda Shutters)  Procedures:  Left ankle surgery planned for 07/04/15  Antibiotics:  None   HPI/Subjective: Afebrile, no CP, no SOB.still with Pain in her left leg. Last INR in the 1.7--1.8 range.   Objective: Filed Vitals:   07/04/15 1321  BP: 129/47  Pulse: 72  Temp: 98.4 F (36.9 C)  Resp:     Intake/Output Summary (Last 24 hours) at 07/04/15 1522 Last data filed at 07/03/15 1700  Gross per 24 hour  Intake    240 ml  Output      0 ml  Net    240 ml   There were no vitals filed for this visit.  Exam:   General:  Feeling ok. Complaining of pain in her left foot and slightly anxious about surgery. No CP, no abd pain, no nausea or vomiting. Afebrile  Cardiovascular: S1 and S2, no rubs or gallops  Respiratory: CTA bilaterally  Abdomen: soft, NT, ND, positive BS  Musculoskeletal: no cyanosis or clubbing. Decrease range of motion on her foot due to pain  Data Reviewed: Basic Metabolic Panel:  Recent Labs Lab 07/01/15 1448  07/02/15 0800 07/02/15 1222 07/02/15 1642 07/02/15 2028 07/04/15 0242 07/04/15 1502  NA 133*  < > 132* 135 134* 136 139 140  K 6.3*  < > 4.1 4.5 3.9 3.8 3.7 3.5  CL 98*  < > 104 103 103 103 107  --   CO2 15*  < > 22 19* 23 25 24   --   GLUCOSE 624*  < > 191* 259* 160* 106* 156* 59*  BUN 30*  < > 18 15 12 9 6   --   CREATININE 1.56*  < > 1.06* 1.16* 1.02* 0.97 0.67  --  CALCIUM 8.9  < > 8.3* 8.0* 8.5* 8.4* 8.5*  --   MG 1.9  --   --   --   --   --   --   --   < > = values in this interval not displayed.  CBC:  Recent Labs Lab 07/01/15 1025 07/02/15 0439 07/03/15 1920 07/04/15 0242 07/04/15 1502  WBC 12.2* 11.6* 7.6 7.2  --   NEUTROABS 10.7*  --   --   --   --   HGB 9.5* 8.0* 9.7* 7.8* 11.2*  HCT 30.4* 26.5* 31.8* 25.6* 33.0*  MCV 74.9* 75.5* 75.4* 75.7*  --   PLT 234 254 263 222  --    Cardiac Enzymes:  Recent Labs Lab  07/01/15 1025  CKTOTAL 42   ProBNP (last 3 results)  Recent Labs  09/30/14 1431  PROBNP 794.3*    CBG:  Recent Labs Lab 07/03/15 1935 07/03/15 2123 07/04/15 0610 07/04/15 1128 07/04/15 1343  GLUCAP 115* 159* 155* 256* 116*    Recent Results (from the past 240 hour(s))  MRSA PCR Screening     Status: Abnormal   Collection Time: 07/01/15  2:48 PM  Result Value Ref Range Status   MRSA by PCR POSITIVE (A) NEGATIVE Final    Comment:        The GeneXpert MRSA Assay (FDA approved for NASAL specimens only), is one component of a comprehensive MRSA colonization surveillance program. It is not intended to diagnose MRSA infection nor to guide or monitor treatment for MRSA infections. RESULT CALLED TO, READ BACK BY AND VERIFIED WITH: Charolotte Eke RN 17:00 07/01/15 ([email or web address])      Studies: No results found.  Scheduled Meds: . sodium chloride   Intravenous Once  . [MAR Hold] baclofen  10 mg Oral BID  . [MAR Hold] Chlorhexidine Gluconate Cloth  6 each Topical Q0600  . [MAR Hold] clonazePAM  0.5 mg Oral BID  . [MAR Hold] darifenacin  7.5 mg Oral Daily  . dextrose      . [MAR Hold] docusate sodium  100 mg Oral BID  . [MAR Hold] escitalopram  20 mg Oral Daily  . [MAR Hold] insulin aspart  0-24 Units Subcutaneous TID WC  . [MAR Hold] insulin detemir  18 Units Subcutaneous BID  . [MAR Hold] methotrexate  2.5 mg Oral Once per day on Mon Fri  . [MAR Hold] metoprolol tartrate  25 mg Oral Daily  . [MAR Hold] mupirocin ointment  1 application Nasal BID  . [MAR Hold] pantoprazole  40 mg Oral Daily  . [MAR Hold] pravastatin  20 mg Oral Daily  . [MAR Hold] sodium chloride  3 mL Intravenous Q12H   Continuous Infusions: . sodium chloride 75 mL/hr at 07/02/15 1409  . lactated ringers 50 mL/hr at 07/04/15 1437    Principal Problem:   Closed left ankle fracture Active Problems:   Rheumatoid arthritis   Chronic anticoagulation   Dysphagia, pharyngoesophageal phase   Spastic  hemiplegia affecting nondominant side   Supratherapeutic INR   S/P aortic valve replacement   Essential hypertension   DKA, type 2   Trimalleolar fracture of left ankle   DKA, type 2, not at goal   Hyperkalemia   Acute kidney injury    Time spent: 30 minutes    Vassie Loll  [facility] Pager [phone]. If 7PM-7AM, please contact night-coverage at [email or web address], password [email or web address] 07/04/2015, 3:22 PM  LOS: 3 days

## 2015-07-05 ENCOUNTER — Encounter (HOSPITAL_COMMUNITY): Payer: Self-pay | Admitting: Orthopaedic Surgery

## 2015-07-05 LAB — CBC
HCT: 28.3 % — ABNORMAL LOW (ref 36.0–46.0)
Hemoglobin: 8.9 g/dL — ABNORMAL LOW (ref 12.0–15.0)
MCH: 24.3 pg — AB (ref 26.0–34.0)
MCHC: 31.4 g/dL (ref 30.0–36.0)
MCV: 77.3 fL — ABNORMAL LOW (ref 78.0–100.0)
PLATELETS: 218 10*3/uL (ref 150–400)
RBC: 3.66 MIL/uL — AB (ref 3.87–5.11)
RDW: 18.5 % — AB (ref 11.5–15.5)
WBC: 6.4 10*3/uL (ref 4.0–10.5)

## 2015-07-05 LAB — GLUCOSE, CAPILLARY
GLUCOSE-CAPILLARY: 125 mg/dL — AB (ref 65–99)
GLUCOSE-CAPILLARY: 391 mg/dL — AB (ref 65–99)
Glucose-Capillary: 215 mg/dL — ABNORMAL HIGH (ref 65–99)
Glucose-Capillary: 196 mg/dL — ABNORMAL HIGH (ref 65–99)

## 2015-07-05 LAB — PROTIME-INR
INR: 1.17 (ref 0.00–1.49)
PROTHROMBIN TIME: 15.1 s (ref 11.6–15.2)

## 2015-07-05 LAB — HEPARIN LEVEL (UNFRACTIONATED): Heparin Unfractionated: 0.49 IU/mL (ref 0.30–0.70)

## 2015-07-05 MED ORDER — WARFARIN SODIUM 7.5 MG PO TABS
7.5000 mg | ORAL_TABLET | Freq: Once | ORAL | Status: AC
  Administered 2015-07-05: 7.5 mg via ORAL
  Filled 2015-07-05: qty 1

## 2015-07-05 MED ORDER — WARFARIN - PHARMACIST DOSING INPATIENT: Freq: Every day | Status: DC

## 2015-07-05 MED ORDER — INSULIN ASPART 100 UNIT/ML ~~LOC~~ SOLN
0.0000 [IU] | Freq: Three times a day (TID) | SUBCUTANEOUS | Status: DC
  Administered 2015-07-05: 3 [IU] via SUBCUTANEOUS
  Administered 2015-07-06 (×2): 7 [IU] via SUBCUTANEOUS

## 2015-07-05 MED ORDER — INSULIN ASPART 100 UNIT/ML ~~LOC~~ SOLN
3.0000 [IU] | Freq: Three times a day (TID) | SUBCUTANEOUS | Status: DC
  Administered 2015-07-05 – 2015-07-06 (×3): 3 [IU] via SUBCUTANEOUS

## 2015-07-05 NOTE — Clinical Social Work Placement (Signed)
   CLINICAL SOCIAL WORK PLACEMENT  NOTE  Date:  07/05/2015  Patient Details  Name: Madison [name] MRN: [id number] Date of Birth: 06/26/1961  Clinical Social Work is seeking post-discharge placement for this patient at the Skilled  Nursing Facility level of care (*CSW will initial, date and re-position this form in  chart as items are completed):  Yes   Patient/family provided with [facility] Clinical Social Work Department's list of facilities offering this level of care within the geographic area requested by the patient (or if unable, by the patient's family).  Yes   Patient/family informed of their freedom to choose among providers that offer the needed level of care, that participate in Medicare, Medicaid or managed care program needed by the patient, have an available bed and are willing to accept the patient.  Yes   Patient/family informed of [facility]'s ownership interest in [facility] and [facility], as well as of the fact that they are under no obligation to receive care at these facilities.  PASRR submitted to EDS on 07/05/15     PASRR number received on 07/05/15     Existing PASRR number confirmed on  (n/a)     FL2 transmitted to all facilities in geographic area requested by pt/family on 07/05/15     FL2 transmitted to all facilities within larger geographic area on  (n/a)     Patient informed that his/her managed care company has contracts with or will negotiate with certain facilities, including the following:   (yes, [facility])         Patient/family informed of bed offers received.  Patient chooses bed at       Physician recommends and patient chooses bed at      Patient to be transferred to   on  .  Patient to be transferred to facility by       Patient family notified on   of transfer.  Name of family member notified:        PHYSICIAN Please sign FL2     Additional Comment:    _______________________________________________ Rod Mae, LCSW 07/05/2015, 4:05 PM [phone]

## 2015-07-05 NOTE — Progress Notes (Addendum)
ANTICOAGULATION CONSULT NOTE - Follow Up Consult  Pharmacy Consult for Warfarin Indication: Mechanical aortic valve  Allergies  Allergen Reactions  . Celebrex [Celecoxib] Rash  . Detrol [Tolterodine] Hives    Patient Measurements:     Vital Signs: Temp: 100.4 F (38 C) (08/16 1300) Temp Source: Oral (08/16 0633) BP: 126/55 mmHg (08/16 1300) Pulse Rate: 79 (08/16 1300)  Labs:  Recent Labs  07/02/15 1642 07/02/15 2028  07/03/15 1920 07/04/15 0242 07/04/15 1502 07/04/15 1620 07/05/15 0648  HGB  --   --   < > 9.7* 7.8* 11.2* 9.5* 8.9*  HCT  --   --   < > 31.8* 25.6* 33.0* 28.0* 28.3*  PLT  --   --   --  263 222  --   --  218  LABPROT  --   --   --  21.4* 20.3*  --   --  15.1  INR  --   --   --  1.87* 1.74*  --   --  1.17  HEPARINUNFRC  --   --   --   --  0.38  --   --   --   CREATININE 1.02* 0.97  --   --  0.67  --   --   --   < > = values in this interval not displayed.  CrCl cannot be calculated (Unknown ideal weight.).   Medications:  Scheduled:  . sodium chloride   Intravenous Once  . baclofen  10 mg Oral BID  . Chlorhexidine Gluconate Cloth  6 each Topical Q0600  . clonazePAM  0.5 mg Oral BID  . darifenacin  7.5 mg Oral Daily  . docusate sodium  100 mg Oral BID  . escitalopram  20 mg Oral Daily  . insulin aspart  0-9 Units Subcutaneous TID WC  . insulin aspart  3 Units Subcutaneous TID WC  . insulin detemir  18 Units Subcutaneous BID  . methotrexate  2.5 mg Oral Once per day on Mon Fri  . metoprolol tartrate  25 mg Oral Daily  . mupirocin ointment  1 application Nasal BID  . pantoprazole  40 mg Oral Daily  . pravastatin  20 mg Oral Daily  . sodium chloride  3 mL Intravenous Q12H    Assessment: 54yo female with history prosthetic aortic valve on warfarin, DM2, RA and CAD presents after mechanical fall and ankle fracture. Pharmacy consulted to resume coumadin, currently bridging with heparin.  Patient's PTA regimen Coumadin 7.5 mg daily except 5mg  on  Mon/Fri  8/16 INR 1.17 8/16 H&H  8.9/28.3, PLT 218  Goal of Therapy:  INR 2.5-3.5 Monitor platelets by anticoagulation protocol: Yes   Plan:  - Coumadin 7.5 mg PO x 1 dose tonight - Continue to bridge with heparin - level due at 1600 today - Monitor daily INR, signs and symptoms of bleeding  01-01-2002 07/05/2015,3:01 PM

## 2015-07-05 NOTE — Progress Notes (Addendum)
Inpatient Diabetes Program Recommendations  AACE/ADA: New Consensus Statement on Inpatient Glycemic Control (2013)  Target Ranges:  Prepandial:   less than 140 mg/dL      Peak postprandial:   less than 180 mg/dL (1-2 hours)      Critically ill patients:  140 - 180 mg/dL   Review of Glycemic control:  Results for JALONDA, ANTIGUA (MRN [id number]) as of 07/05/2015 14:32  Ref. Range 07/04/2015 13:43 07/04/2015 15:11 07/04/2015 17:46 07/04/2015 18:06 07/04/2015 21:13 07/05/2015 07:17 07/05/2015 12:01  Glucose-Capillary Latest Ref Range: 65-99 mg/dL 355 (H) 28 (LL) 59 (L) 105 (H) 91 125 (H) 391 (H)   Diabetes history: Type 1 diabetes Outpatient Diabetes medications: NPH 38 units q AM Current orders for Inpatient glycemic control:  Levemir 18 units bid, Novolog TCTS tid with meals  Text paged Dr. Gwenlyn Perking and requested decrease to Novolog sensitive and 3 units of Novolog meal coverage.   Will follow.  Thanks, Beryl Meager, RN, BC-ADM Inpatient Diabetes Coordinator Pager [phone] (8a-5p)

## 2015-07-05 NOTE — Transfer of Care (Addendum)
Immediate Anesthesia Transfer of Care Note  Patient: Madison [name]  Procedure(s) Performed: Procedure(s): OPEN REDUCTION INTERNAL FIXATION (ORIF)  TRIMAL ANKLE FRACTURE (Left)  Patient Location: PACU  Anesthesia Type:General  Level of Consciousness: awake, alert  and oriented  Airway & Oxygen Therapy: Patient Spontanous Breathing and Patient connected to nasal cannula oxygen  Post-op Assessment: Report given to RN and Post -op Vital signs reviewed and stable  Post vital signs: Reviewed and stable  Last Vitals:  Filed Vitals:   07/05/15 0633  BP: 139/55  Pulse: 81  Temp: 37.2 C  Resp: 16    Complications: No apparent anesthesia complications

## 2015-07-05 NOTE — Evaluation (Signed)
Physical Therapy Evaluation Patient Details Name: Madison [name] MRN: [id number] DOB: 28-Feb-1961 Today's Date: 07/05/2015   History of Present Illness  Patient is a 54 y/o female presents with left ankle fracture s/p mechanical fall at home, DKA, mild hyperkalemia and acute on chronic kidney disease. Now s/p ORIF left ankle. PMH includes DM, CAD, aortic stenosis, HTN, MI, anxiety, depression, CHF, RA.  Clinical Impression  Patient presents with pain and post surgical deficits LLE s/p ORIF. Pt with hx of CVA and not able to functionally utilize LUE. Fearful and anxious of falling during assessment. Tolerated squat pivot transfer to chair with total A of 2 however pt placing LLE on floor despite max cues for NWB. Pt lives alone and not safe to return home. Would benefit from ST SNF to maximize independence and mobility prior to return home. Will follow acutely.     Follow Up Recommendations SNF;Supervision/Assistance - 24 hour    Equipment Recommendations  Wheelchair (measurements PT);Wheelchair cushion (measurements PT)    Recommendations for Other Services OT consult     Precautions / Restrictions Precautions Precautions: Fall Restrictions Weight Bearing Restrictions: Yes LLE Weight Bearing: Non weight bearing      Mobility  Bed Mobility Overal bed mobility: Needs Assistance Bed Mobility: Rolling;Supine to Sit Rolling: Mod assist;Max assist   Supine to sit: Max assist;HOB elevated     General bed mobility comments: MOd A to roll to the left and Max A to roll partially to the right due to pain and inability to use LUE. Assist with bringing BLEs to EOB, scooting bottom and to elevate trunk. Cues for sequencing.  Transfers Overall transfer level: Needs assistance   Transfers: Squat Pivot Transfers     Squat pivot transfers: Total assist;+2 physical assistance     General transfer comment: Squat pivot transfer bed to chair towards Rt side using chuck pad for support. Pt  anxious and fearful of falling. Pt placing some weight through LLE despite max cues to maintain NWB LLE.  Ambulation/Gait                Stairs            Wheelchair Mobility    Modified Rankin (Stroke Patients Only)       Balance Overall balance assessment: Needs assistance;History of Falls Sitting-balance support: Feet supported;Single extremity supported Sitting balance-Leahy Scale: Poor Sitting balance - Comments: Posterior lean with LLE elevated ont rash can requiring Min A for upright.   Standing balance support: During functional activity Standing balance-Leahy Scale: Zero                               Pertinent Vitals/Pain Pain Assessment: Faces Faces Pain Scale: Hurts whole lot Pain Location: left ankle with movement/in dependent position. Pain Descriptors / Indicators: Throbbing;Sore;Aching Pain Intervention(s): Monitored during session;Repositioned;RN gave pain meds during session    Home Living Family/patient expects to be discharged to:: Skilled nursing facility Living Arrangements: Alone                    Prior Function Level of Independence: Independent         Comments: Reports she has an aide assist with IADLs, driving to appts etc.      Hand Dominance   Dominant Hand: Right    Extremity/Trunk Assessment   Upper Extremity Assessment: Defer to OT evaluation (LUE not functional since CVA in 1995 per pt report. Contractures present  in elbow.)           Lower Extremity Assessment: LLE deficits/detail   LLE Deficits / Details: Able to wiggle toes and perform SLR in beginning of session.     Communication   Communication: No difficulties  Cognition Arousal/Alertness: Awake/alert Behavior During Therapy: WFL for tasks assessed/performed Overall Cognitive Status: Within Functional Limits for tasks assessed                      General Comments      Exercises General Exercises - Lower  Extremity Straight Leg Raises: Left;5 reps;Supine      Assessment/Plan    PT Assessment Patient needs continued PT services  PT Diagnosis Generalized weakness;Acute pain   PT Problem List Decreased strength;Pain;Decreased activity tolerance;Decreased balance;Decreased mobility;Decreased knowledge of precautions;Decreased safety awareness  PT Treatment Interventions Balance training;Functional mobility training;Therapeutic activities;Therapeutic exercise;Wheelchair mobility training;Patient/family education;DME instruction   PT Goals (Current goals can be found in the Care Plan section) Acute Rehab PT Goals Patient Stated Goal: to go to rehab PT Goal Formulation: With patient Time For Goal Achievement: 07/19/15 Potential to Achieve Goals: Fair    Frequency Min 3X/week   Barriers to discharge Decreased caregiver support Pt lives alone    Co-evaluation               End of Session Equipment Utilized During Treatment: Gait belt Activity Tolerance: Patient limited by pain;Patient tolerated treatment well Patient left: in chair;with call bell/phone within reach;with nursing/sitter in room Nurse Communication: Mobility status (Informed tech of transfer method back tobed.)         Time: 6754-4920 PT Time Calculation (min) (ACUTE ONLY): 25 min   Charges:   PT Evaluation $Initial PT Evaluation Tier I: 1 Procedure PT Treatments $Therapeutic Activity: 8-22 mins   PT G Codes:        Kaianna Dolezal A Delpha Perko 07/05/2015, 11:53 AM Mylo Red, PT, DPT [phone]

## 2015-07-05 NOTE — Clinical Social Work Note (Signed)
Clinical Social Work Assessment  Patient Details  Name: Madison [name] MRN: [id number] Date of Birth: 1961-01-11  Date of referral:  07/05/15               Reason for consult:  Facility Placement, Discharge Planning                Permission sought to share information with:  Facility Sport and exercise psychologist, Family Supports Permission granted to share information::  Yes, Verbal Permission Granted  Name::     n/a  Agency::  [facility]  Relationship::  n/a  Contact Information:  n/a  Housing/Transportation Living arrangements for the past 2 months:  [place] of Information:  Patient Patient Interpreter Needed:  None Criminal Activity/Legal Involvement Pertinent to Current Situation/Hospitalization:  No - Comment as needed Significant Relationships:  Pets Lives with:  Pets, Self Do you feel safe going back to the place where you live?  No (High fall risk.) Need for family participation in patient care:  No (Coment) (Patient able to make own decisions.)  Care giving concerns:  Patient expressed no concerns at this time.   Social Worker assessment / plan:  CSW received referral for possible SNF placement at time of discharge. CSW met with patient to discuss discharge disposition. Patient expressed understanding of PT recommendation for SNF placement. Patient has no SNF preference at this time, but is requesting placement in close proximity to patient's home. CSW to continue to follow and assist with discharge planning needs.  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  Patient/Family's Understanding of and Emotional Response to Diagnosis, Current  Treatment, and Prognosis:  Patient understanding and agreeable to CSW plan of care.  Emotional Assessment Appearance:  Appears stated age Attitude/Demeanor/Rapport:  Other (Pleasant.) Affect (typically observed):  Accepting, Appropriate, Calm, Pleasant Orientation:  Oriented to Self, Oriented to Place, Oriented to  Time, Oriented to Situation Alcohol / Substance use:  Not Applicable Psych involvement (Current and /or in the community):  No (Comment) (Not appropriate on this admission.)  Discharge Needs  Concerns to be addressed:  No discharge needs identified Readmission within the last 30 days:  No Current discharge risk:  None Barriers to Discharge:  No Barriers Identified   Caroline Sauger, LCSW 07/05/2015, 4:01 PM [phone]

## 2015-07-05 NOTE — Care Management Important Message (Signed)
Important Message  Patient Details  Name: Madison [name] MRN: [id number] Date of Birth: [birth year]   Medicare Important Message Given:  Yes-second notification given    Durenda Guthrie, RN 07/05/2015, 4:36 PM

## 2015-07-05 NOTE — Progress Notes (Addendum)
   Subjective:  Patient reports pain as mild.  Objective:   VITALS:   Filed Vitals:   07/04/15 1848 07/04/15 2114 07/05/15 0233 07/05/15 0633  BP: 134/59 115/79 136/52 139/55  Pulse: 77 79 79 81  Temp: 98.6 F (37 C) 98.7 F (37.1 C) 99.5 F (37.5 C) 99 F (37.2 C)  TempSrc: Oral  Oral Oral  Resp: 14 15 16 16   SpO2: 98% 97% 97% 97%    Neurologically intact Neurovascular intact Sensation intact distally Intact pulses distally Incision: dressing C/D/I and no drainage No cellulitis present Compartment soft   Lab Results  Component Value Date   WBC 6.4 07/05/2015   HGB 8.9* 07/05/2015   HCT 28.3* 07/05/2015   MCV 77.3* 07/05/2015   PLT 218 07/05/2015     Assessment/Plan:  1 Day Post-Op   - Expected postop acute blood loss anemia - will monitor for symptoms - Up with PT/OT - DVT ppx - SCDs, ambulation, warfarin - NWB operative extremity - Pain control - patient needs SNF - f/u 2 weeks - can restart baseline anticoagulation prior to injury and surgery  07/07/2015 07/05/2015, 7:47 AM [phone]

## 2015-07-05 NOTE — Progress Notes (Signed)
ANTICOAGULATION CONSULT NOTE - Follow Up Consult  Pharmacy Consult for Warfarin / Heparin Indication: Mechanical aortic valve  Allergies  Allergen Reactions  . Celebrex [Celecoxib] Rash  . Detrol [Tolterodine] Hives    Patient Measurements:     Vital Signs: Temp: 100.4 F (38 C) (08/16 1300) Temp Source: Oral (08/16 0633) BP: 126/55 mmHg (08/16 1300) Pulse Rate: 79 (08/16 1300)  Labs:  Recent Labs  07/02/15 2028  07/03/15 1920 07/04/15 0242 07/04/15 1502 07/04/15 1620 07/05/15 0648 07/05/15 1620  HGB  --   < > 9.7* 7.8* 11.2* 9.5* 8.9*  --   HCT  --   < > 31.8* 25.6* 33.0* 28.0* 28.3*  --   PLT  --   --  263 222  --   --  218  --   LABPROT  --   --  21.4* 20.3*  --   --  15.1  --   INR  --   --  1.87* 1.74*  --   --  1.17  --   HEPARINUNFRC  --   --   --  0.38  --   --   --  0.49  CREATININE 0.97  --   --  0.67  --   --   --   --   < > = values in this interval not displayed.  CrCl cannot be calculated (Unknown ideal weight.).   Medications:  Scheduled:  . sodium chloride   Intravenous Once  . baclofen  10 mg Oral BID  . Chlorhexidine Gluconate Cloth  6 each Topical Q0600  . clonazePAM  0.5 mg Oral BID  . darifenacin  7.5 mg Oral Daily  . docusate sodium  100 mg Oral BID  . escitalopram  20 mg Oral Daily  . insulin aspart  0-9 Units Subcutaneous TID WC  . insulin aspart  3 Units Subcutaneous TID WC  . insulin detemir  18 Units Subcutaneous BID  . methotrexate  2.5 mg Oral Once per day on Mon Fri  . metoprolol tartrate  25 mg Oral Daily  . mupirocin ointment  1 application Nasal BID  . pantoprazole  40 mg Oral Daily  . pravastatin  20 mg Oral Daily  . sodium chloride  3 mL Intravenous Q12H  . warfarin  7.5 mg Oral ONCE-1800  . Warfarin - Pharmacist Dosing Inpatient   Does not apply q1800    Assessment: 54yo female with history prosthetic aortic valve on warfarin, DM2, RA and CAD presents after mechanical fall and ankle fracture. Pharmacy consulted to  resume coumadin, currently bridging with heparin.  Patient's PTA regimen Coumadin 5 mg every Tue/Thu/Fri/Sat and 2.5 mg on Mon/Wed  8/16 INR 1.17 8/16 H&H  8.9/28.3, PLT 218  PM Heparin level therapeutic   Goal of Therapy:  INR 2.5-3.5 Monitor platelets by anticoagulation protocol: Yes  Heparin level = 0.3-0.7   Plan:  - Coumadin 7.5 mg PO x 1 dose tonight - Continue heparin at 1150 units / hr - Monitor daily INR, signs and symptoms of bleeding - Follow up AM labs  Thank you. Okey Regal, PharmD [phone] 07/05/2015,4:45 PM

## 2015-07-05 NOTE — Progress Notes (Signed)
[facility] PROGRESS NOTE  Madison [name] YNW:[id number] DOB: June 14, 1961 DOA: 07/01/2015 PCP: [name], NP  Assessment/Plan: Left trimalleolar ankle fracture: Mechanical fall. Patient has had no recent illness, dizziness or falls prior to this one. -Orthopedic surgery consulted. Fracture reduced and patient is s/p surgery on 07/04/15 -Cardiology consulted for preop clearance and management of anticoagulation and antiplatelets therapy. Appreciate assistance and rec's -EKG w/o acute ischemic changes, chest x-ray w/o signs of PNA -ASA and plavix will continue on hold until coumadin therapeutic;  -Supportive care with ice, pain medications, incentive spirometry -Nonweightbearing on LLE  DKA in a type 2 insulin-dependent diabetic -Closely supervised by Dr. Romero Belling as an outpatient -DKA resolved now after IV insulin and IVF's -Anion gap is 13, bicarbonate 20. 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 -will continue B-blocker  -will continue holding ASA,  -ok to resume coumadin per pharmacy -ASA and plavix to be resumed after coumadin therapeutic   Essential hypertension -patient with soft BP on presentation -Will continue holding lisinopril for now; Continue beta blocker. -will change IVF's to maintenance only and if intake remains good in the next 24 hours, will change to NSL -BP better and rising  Chronic kidney disease stage III -improved after IVF's and holding ACE inhibitors  -will monitor and follow trend after sx; if remains stable by 8/17 will resume  home regimen (including ACE inhibitors), patient BP stable and rising  Rheumatoid arthritis -Continue methotrexate   Code Status: Full Family Communication: no family at bedside  Disposition Plan: to be determine    Consultants:  Orthopedic service (Dr. Roda Shutters)  Procedures:  Left ankle surgery done on 07/04/15  2-D echo: pending  Antibiotics:  None   HPI/Subjective: Afebrile, no CP, no SOB. Reports improvement in her left leg pain. CBG's has been fluctuating and as high as 390 range  Objective: Filed Vitals:   07/05/15 1300  BP: 126/55  Pulse: 79  Temp: 100.4 F (38 C)  Resp: 14    Intake/Output Summary (Last 24 hours) at 07/05/15 1432 Last data filed at 07/05/15 1300  Gross per 24 hour  Intake   3220 ml  Output    630 ml  Net   2590 ml   There were no vitals filed for this visit.  Exam:   General:  Feeling ok. Patient sitting on chair and reports even leg still tender is better. No CP, no abd pain, no nausea or vomiting. Afebrile  Cardiovascular: S1 and S2, no rubs or gallops  Respiratory: CTA bilaterally  Abdomen: soft, NT, ND, positive BS  Musculoskeletal: no cyanosis or clubbing. Left leg with dressing in place.  Data Reviewed: Basic Metabolic Panel:  Recent Labs Lab 07/01/15 1448  07/02/15 0800 07/02/15 1222 07/02/15 1642 07/02/15 2028 07/04/15 0242 07/04/15 1502 07/04/15 1620  NA 133*  < > 132* 135 134* 136 139 140 140  K 6.3*  < > 4.1 4.5 3.9 3.8 3.7 3.5 3.6  CL 98*  < > 104 103 103 103 107  --   --   CO2 15*  < > 22 19* 23 25 24   --   --  GLUCOSE 624*  < > 191* 259* 160* 106* 156* 59* 107*  BUN 30*  < > 18 15 12 9 6   --   --   CREATININE 1.56*  < > 1.06* 1.16* 1.02* 0.97 0.67  --   --   CALCIUM 8.9  < > 8.3* 8.0* 8.5* 8.4* 8.5*  --   --   MG 1.9  --   --   --   --   --   --   --   --   < > = values in this interval not displayed.  CBC:  Recent Labs Lab 07/01/15 1025 07/02/15 0439 07/03/15 1920 July 21, 2015 0242 07-21-2015 1502  21-Jul-2015 1620 07/05/15 0648  WBC 12.2* 11.6* 7.6 7.2  --   --  6.4  NEUTROABS 10.7*  --   --   --   --   --   --   HGB 9.5* 8.0* 9.7* 7.8* 11.2* 9.5* 8.9*  HCT 30.4* 26.5* 31.8* 25.6* 33.0* 28.0* 28.3*  MCV 74.9* 75.5* 75.4* 75.7*  --   --  77.3*  PLT 234 254 263 222  --   --  218   Cardiac Enzymes:  Recent Labs Lab 07/01/15 1025  CKTOTAL 42   ProBNP (last 3 results)  Recent Labs  09/30/14 1431  PROBNP 794.3*    CBG:  Recent Labs Lab Jul 21, 2015 1746 2015-07-21 1806 2015/07/21 2113 07/05/15 0717 07/05/15 1201  GLUCAP 59* 105* 91 125* 391*    Recent Results (from the past 240 hour(s))  MRSA PCR Screening     Status: Abnormal   Collection Time: 07/01/15  2:48 PM  Result Value Ref Range Status   MRSA by PCR POSITIVE (A) NEGATIVE Final    Comment:        The GeneXpert MRSA Assay (FDA approved for NASAL specimens only), is one component of a comprehensive MRSA colonization surveillance program. It is not intended to diagnose MRSA infection nor to guide or monitor treatment for MRSA infections. RESULT CALLED TO, READ BACK BY AND VERIFIED WITH: 08/31/15 RN 17:00 07/01/15 ([email or web address])      Studies: Dg Ankle Complete Left  2015-07-21   CLINICAL DATA:  ORIF left ankle.  EXAM: DG C-ARM 61-120 MIN; LEFT ANKLE COMPLETE - 3+ VIEW  COMPARISON:  07/01/2015  FLUOROSCOPY TIME:  0 minutes 25 seconds.  Four images.  FINDINGS: Examination demonstrates interval reduction and fixation of patient's fracture dislocation at the ankle. There is a lateral fixation plate with screws bridging patient's distal fibular fracture with hardware intact and anatomic alignment over the fracture site. There are 2 orthopedic screws extending from the inferior to superior bridging patient's medial malleolar fracture intact with anatomic alignment over the fracture site. There is anatomic alignment about the ankle mortise. Evidence of patient's posterior malleolar fracture without significant displacement.  Remainder of the exam is unchanged.  IMPRESSION: Fixation and reduction of patient's ankle fracture dislocation with hardware bridging patient's distal fibular fracture and medial malleolar fracture intact. Normal alignment about the fracture sites and ankle mortise.   Electronically Signed   By: 08/31/2015 M.D.   On: 07/21/15 17:08   Dg C-arm 1-60 Min  2015/07/21   CLINICAL DATA:  ORIF left ankle.  EXAM: DG C-ARM 61-120 MIN; LEFT ANKLE COMPLETE - 3+ VIEW  COMPARISON:  07/01/2015  FLUOROSCOPY TIME:  0 minutes 25 seconds.  Four images.  FINDINGS: Examination demonstrates interval reduction and fixation of patient's fracture dislocation at the ankle. There is a lateral  fixation plate with screws bridging patient's distal fibular fracture with hardware intact and anatomic alignment over the fracture site. There are 2 orthopedic screws extending from the inferior to superior bridging patient's medial malleolar fracture intact with anatomic alignment over the fracture site. There is anatomic alignment about the ankle mortise. Evidence of patient's posterior malleolar fracture without significant displacement. Remainder of the exam is unchanged.  IMPRESSION: Fixation and reduction of patient's ankle fracture dislocation with hardware bridging patient's distal fibular fracture and medial malleolar fracture intact. Normal alignment about the fracture sites and ankle mortise.   Electronically Signed   By: Elberta Fortis M.D.   On: 07/04/2015 17:08    Scheduled Meds: . sodium chloride   Intravenous Once  . baclofen  10 mg Oral BID  . Chlorhexidine Gluconate Cloth  6 each Topical Q0600  . clonazePAM  0.5 mg Oral BID  . darifenacin  7.5 mg Oral Daily  . docusate sodium  100 mg Oral BID  . escitalopram  20 mg Oral Daily  . insulin aspart  0-9 Units Subcutaneous TID WC  . insulin aspart  3 Units Subcutaneous TID WC  . insulin detemir  18 Units Subcutaneous BID  . methotrexate  2.5 mg Oral Once per day on Mon  Fri  . metoprolol tartrate  25 mg Oral Daily  . mupirocin ointment  1 application Nasal BID  . pantoprazole  40 mg Oral Daily  . pravastatin  20 mg Oral Daily  . sodium chloride  3 mL Intravenous Q12H   Continuous Infusions: . sodium chloride 75 mL/hr at 07/02/15 1409  . heparin 1,150 Units/hr (07/05/15 0805)  . lactated ringers 50 mL/hr at 07/04/15 1437    Principal Problem:   Closed left ankle fracture Active Problems:   Rheumatoid arthritis   Chronic anticoagulation   Dysphagia, pharyngoesophageal phase   Spastic hemiplegia affecting nondominant side   Supratherapeutic INR   S/P aortic valve replacement   Essential hypertension   DKA, type 2   Trimalleolar fracture of left ankle   DKA, type 2, not at goal   Hyperkalemia   Acute kidney injury    Time spent: 30 minutes    Vassie Loll  [facility] Pager [phone]. If 7PM-7AM, please contact night-coverage at [email or web address], password [facility] 07/05/2015, 2:32 PM  LOS: 4 days

## 2015-07-06 ENCOUNTER — Ambulatory Visit (HOSPITAL_COMMUNITY): Payer: Medicaid Other

## 2015-07-06 DIAGNOSIS — Z7901 Long term (current) use of anticoagulants: Secondary | ICD-10-CM

## 2015-07-06 DIAGNOSIS — S82892D Other fracture of left lower leg, subsequent encounter for closed fracture with routine healing: Secondary | ICD-10-CM

## 2015-07-06 LAB — CBC
HEMATOCRIT: 30.8 % — AB (ref 36.0–46.0)
HEMOGLOBIN: 9.7 g/dL — AB (ref 12.0–15.0)
MCH: 24.5 pg — AB (ref 26.0–34.0)
MCHC: 31.5 g/dL (ref 30.0–36.0)
MCV: 77.8 fL — AB (ref 78.0–100.0)
Platelets: 204 10*3/uL (ref 150–400)
RBC: 3.96 MIL/uL (ref 3.87–5.11)
RDW: 19.1 % — ABNORMAL HIGH (ref 11.5–15.5)
WBC: 7.5 10*3/uL (ref 4.0–10.5)

## 2015-07-06 LAB — GLUCOSE, CAPILLARY
GLUCOSE-CAPILLARY: 119 mg/dL — AB (ref 65–99)
Glucose-Capillary: 315 mg/dL — ABNORMAL HIGH (ref 65–99)
Glucose-Capillary: 356 mg/dL — ABNORMAL HIGH (ref 65–99)
Glucose-Capillary: 153 mg/dL — ABNORMAL HIGH (ref 65–99)

## 2015-07-06 LAB — BASIC METABOLIC PANEL
ANION GAP: 8 (ref 5–15)
BUN: 8 mg/dL (ref 6–20)
CHLORIDE: 100 mmol/L — AB (ref 101–111)
CO2: 26 mmol/L (ref 22–32)
Calcium: 8.5 mg/dL — ABNORMAL LOW (ref 8.9–10.3)
Creatinine, Ser: 0.76 mg/dL (ref 0.44–1.00)
GFR calc Af Amer: >60 mL/min (ref >60)
GLUCOSE: 135 mg/dL — AB (ref 65–99)
POTASSIUM: 3.7 mmol/L (ref 3.5–5.1)
Sodium: 134 mmol/L — ABNORMAL LOW (ref 135–145)

## 2015-07-06 LAB — PROTIME-INR
INR: 1.12 (ref 0.00–1.49)
PROTHROMBIN TIME: 14.6 s (ref 11.6–15.2)

## 2015-07-06 LAB — HEPARIN LEVEL (UNFRACTIONATED): Heparin Unfractionated: 0.63 IU/mL (ref 0.30–0.70)

## 2015-07-06 MED ORDER — WARFARIN SODIUM 5 MG PO TABS
10.0000 mg | ORAL_TABLET | Freq: Once | ORAL | Status: AC
  Administered 2015-07-06: 10 mg via ORAL
  Filled 2015-07-06: qty 2

## 2015-07-06 MED ORDER — INSULIN ASPART 100 UNIT/ML ~~LOC~~ SOLN
5.0000 [IU] | Freq: Three times a day (TID) | SUBCUTANEOUS | Status: DC
  Administered 2015-07-07 (×2): 5 [IU] via SUBCUTANEOUS

## 2015-07-06 MED ORDER — INSULIN ASPART 100 UNIT/ML ~~LOC~~ SOLN
0.0000 [IU] | Freq: Three times a day (TID) | SUBCUTANEOUS | Status: DC
  Administered 2015-07-07: 15 [IU] via SUBCUTANEOUS
  Administered 2015-07-07 – 2015-07-08 (×3): 5 [IU] via SUBCUTANEOUS
  Administered 2015-07-09: 15 [IU] via SUBCUTANEOUS

## 2015-07-06 NOTE — Progress Notes (Signed)
ANTICOAGULATION CONSULT NOTE - Follow Up Consult  Pharmacy Consult for Heparin/Warfarin Indication: Mechanical aortic valve  Allergies  Allergen Reactions  . Celebrex [Celecoxib] Rash  . Detrol [Tolterodine] Hives    Patient Measurements:   Heparin Dosing Weight: 74kg  Vital Signs: Temp: 98.4 F (36.9 C) (08/17 0543) Temp Source: Oral (08/17 0543) BP: 114/51 mmHg (08/17 0543) Pulse Rate: 73 (08/17 0543)  Labs:  Recent Labs  07/04/15 0242  07/04/15 1620 07/05/15 0648 07/05/15 1620 07/06/15 0614  HGB 7.8*  < > 9.5* 8.9*  --  9.7*  HCT 25.6*  < > 28.0* 28.3*  --  30.8*  PLT 222  --   --  218  --  204  LABPROT 20.3*  --   --  15.1  --  14.6  INR 1.74*  --   --  1.17  --  1.12  HEPARINUNFRC 0.38  --   --   --  0.49 0.63  CREATININE 0.67  --   --   --   --  0.76  < > = values in this interval not displayed.  CrCl cannot be calculated (Unknown ideal weight.).   Medications:  Scheduled:  . sodium chloride   Intravenous Once  . baclofen  10 mg Oral BID  . Chlorhexidine Gluconate Cloth  6 each Topical Q0600  . clonazePAM  0.5 mg Oral BID  . darifenacin  7.5 mg Oral Daily  . docusate sodium  100 mg Oral BID  . escitalopram  20 mg Oral Daily  . insulin aspart  0-9 Units Subcutaneous TID WC  . insulin aspart  3 Units Subcutaneous TID WC  . insulin detemir  18 Units Subcutaneous BID  . methotrexate  2.5 mg Oral Once per day on Mon Fri  . metoprolol tartrate  25 mg Oral Daily  . mupirocin ointment  1 application Nasal BID  . pantoprazole  40 mg Oral Daily  . pravastatin  20 mg Oral Daily  . sodium chloride  3 mL Intravenous Q12H  . Warfarin - Pharmacist Dosing Inpatient   Does not apply q1800    Assessment: 54yo female with history prosthetic aortic valve on warfarin, DM2, RA and CAD presents after mechanical fall and ankle fracture. Pharmacy consulted to resume coumadin, currently bridging with heparin. Patient's PTA regimen Coumadin 7.5 mg daily and 5 mg on  Mon/Fri   8/17 H&H 9.7/30.8, PLT 204 8/17 INR 1.12 8/17 Heparin level  therapeutic   Goal of Therapy:  INR 2.5-3.5 Heparin level = 0.3-0.7 Monitor platelets by anticoagulation protocol: Yes   Plan:  Plan: - Continue heparin at 1150 units/hr - Coumadin 10mg  tonight x 1 dose - Monitor daily heparin level - Monitor daily INR, s/s bleeding, CBC  Thank you  , Pharm.D., BCPS Clinical Pharmacist [phone] Pager 07/06/2015 10:42 AM

## 2015-07-06 NOTE — Progress Notes (Addendum)
[facility] PROGRESS NOTE  BONNETTA ALLBEE PPJ:[id number] DOB: [birth year] DOA: 07/01/2015 PCP: [name], NP  Assessment/Plan: Left trimalleolar ankle fracture: Mechanical fall. Patient has had no recent illness, dizziness or falls prior to this one. -Orthopedic surgery consulted. Fracture reduced and patient is s/p surgery on 07/04/15 -Cardiology consulted for preop clearance and management of anticoagulation and antiplatelets therapy. Appreciate assistance and rec's -EKG w/o acute ischemic changes, chest x-ray w/o signs of PNA -ASA and plavix will continue on hold until coumadin therapeutic;  -Supportive care with ice, pain medications, incentive spirometry -Nonweightbearing on LLE  DKA in a type 2 insulin-dependent diabetic -Closely supervised by Dr. Romero Belling as an outpatient -DKA resolved now after IV insulin and IVF's - CBG (last 3)   Recent Labs  07/06/15 0623 07/06/15 1232 07/06/15 1653  GLUCAP 119* 315* 356*    - increase the novlog to 5 units TIDAC. 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 -will continue B-blocker  -will continue holding ASA,  -ok to resume coumadin per pharmacy -ASA and plavix to be resumed after coumadin therapeutic   Essential hypertension -patient with soft BP on presentation -Will continue holding lisinopril for now; Continue beta blocker. -will change IVF's to maintenance only and if intake remains good in the next 24 hours, will change to NSL -BP better and rising  Chronic kidney disease stage III -improved after  IVF's and holding ACE inhibitors  -will monitor and follow trend after sx; if remains stable by 8/17 will resume home regimen (including ACE inhibitors), patient BP stable and rising  Rheumatoid arthritis -Continue methotrexate   Code Status: Full Family Communication: no family at bedside  Disposition Plan: to snf when INR IS >2   Consultants:  Orthopedic service (Dr. Roda Shutters)  Procedures:  Left ankle surgery done on 07/04/15  2-D echo: pending  Antibiotics:  None   HPI/Subjective: No chest pain, or sob, comfortable.   Objective: Filed Vitals:   07/06/15 1500  BP: 111/52  Pulse: 73  Temp: 98.1 F (36.7 C)  Resp: 18    Intake/Output Summary (Last 24 hours) at 07/06/15 1824 Last data filed at 07/06/15 1500  Gross per 24 hour  Intake    760 ml  Output      0 ml  Net    760 ml   There were no vitals filed for this visit.  Exam:   General:  Feeling ok. Patient sitting on chair and reports even leg still tender is better. No CP, no abd pain, no nausea or vomiting. Afebrile  Cardiovascular: S1 and S2, no rubs or gallops  Respiratory: CTA bilaterally  Abdomen: soft, NT, ND, positive BS  Musculoskeletal: no cyanosis or clubbing. Left leg with dressing in place.  Data Reviewed: Basic Metabolic Panel:  Recent Labs Lab 07/01/15 1448  07/02/15 1222 07/02/15 1642 07/02/15 2028 07/04/15 0242 07/04/15 1502 07/04/15 1620 07/06/15 0614  NA 133*  < > 135 134* 136 139 140 140 134*  K 6.3*  < > 4.5 3.9 3.8 3.7 3.5 3.6 3.7  CL 98*  < > 103 103 103 107  --   --  100*  CO2 15*  < > 19* 23 25 24   --   --  26  GLUCOSE 624*  < > 259* 160* 106* 156* 59* 107* 135*  BUN 30*  < > 15 12 9 6   --   --  8  CREATININE 1.56*  < > 1.16* 1.02* 0.97 0.67  --   --  0.76  CALCIUM 8.9  < > 8.0* 8.5* 8.4* 8.5*  --   --  8.5*  MG 1.9  --   --   --   --   --   --   --   --   < > = values in this interval not displayed.  CBC:  Recent Labs Lab 07/01/15 1025 07/02/15 0439  07/03/15 1920 07/04/15 0242 07/04/15 1502 07/04/15 1620 07/05/15 0648 07/06/15 0614  WBC 12.2* 11.6* 7.6 7.2  --   --  6.4 7.5  NEUTROABS 10.7*  --   --   --   --   --   --   --   HGB 9.5* 8.0* 9.7* 7.8* 11.2* 9.5* 8.9* 9.7*  HCT 30.4* 26.5* 31.8* 25.6* 33.0* 28.0* 28.3* 30.8*  MCV 74.9* 75.5* 75.4* 75.7*  --   --  77.3* 77.8*  PLT 234 254 263 222  --   --  218 204   Cardiac Enzymes:  Recent Labs Lab 07/01/15 1025  CKTOTAL 42   ProBNP (last 3 results)  Recent Labs  09/30/14 1431  PROBNP 794.3*    CBG:  Recent Labs Lab 07/05/15 1626 07/05/15 2137 07/06/15 0623 07/06/15 1232 07/06/15 1653  GLUCAP 215* 196* 119* 315* 356*    Recent Results (from the past 240 hour(s))  MRSA PCR Screening     Status: Abnormal   Collection Time: 07/01/15  2:48 PM  Result Value Ref Range Status   MRSA by PCR POSITIVE (A) NEGATIVE Final    Comment:        The GeneXpert MRSA Assay (FDA approved for NASAL specimens only), is one component of a comprehensive MRSA colonization surveillance program. It is not intended to diagnose MRSA infection nor to guide or monitor treatment for MRSA infections. RESULT CALLED TO, READ BACK BY AND VERIFIED WITH: 07/08/15 RN 17:00 07/01/15 ([email or web address])      Studies: No results found.  Scheduled Meds: . sodium chloride   Intravenous Once  . baclofen  10 mg Oral BID  . Chlorhexidine Gluconate Cloth  6 each Topical Q0600  . clonazePAM  0.5 mg Oral BID  . darifenacin  7.5 mg Oral Daily  . docusate sodium  100 mg Oral BID  . escitalopram  20 mg Oral Daily  . insulin aspart  0-9 Units Subcutaneous TID WC  . insulin aspart  3 Units Subcutaneous TID WC  . insulin detemir  18 Units Subcutaneous BID  . methotrexate  2.5 mg Oral Once per day on Mon Fri  . metoprolol tartrate  25 mg Oral Daily  . mupirocin ointment  1 application Nasal BID  . pantoprazole  40 mg Oral Daily  . pravastatin  20 mg Oral Daily  . sodium chloride  3 mL Intravenous  Q12H  . Warfarin - Pharmacist Dosing Inpatient   Does not apply q1800   Continuous Infusions: . sodium chloride 20 mL/hr at 07/06/15 0700  . heparin 1,150 Units/hr (07/05/15 1926)    Principal Problem:   Closed left ankle fracture Active Problems:   Rheumatoid arthritis   Chronic anticoagulation   Dysphagia, pharyngoesophageal phase  Spastic hemiplegia affecting nondominant side   Supratherapeutic INR   S/P aortic valve replacement   Essential hypertension   DKA, type 2   Trimalleolar fracture of left ankle   DKA, type 2, not at goal   Hyperkalemia   Acute kidney injury    Time spent: 30 minutes    [facility]  [facility] Pager [phone]. If 7PM-7AM, please contact night-coverage at [email or web address], password [facility] 07/06/2015, 6:24 PM  LOS: 5 days

## 2015-07-06 NOTE — Clinical Social Work Note (Signed)
CSW reviewed bed offers with patient.  Patient chose [facility] at time of discharge and is requesting [facility] transportation.  Patient reports her sister, [name] [phone], that will be available to complete SNF admission's paperwork (per patient).  Disposition: [facility] Transportation: [facility] Barrier: low INR  Vickii Penna, LCSW [phone]  [facility]iatric [facility] Orthopedics (5N 1-8) Clinical Social Worker

## 2015-07-06 NOTE — Care Management Note (Signed)
Case Management Note  Patient Details  Name: YARED Bedie Dominey MRN: [id number] Date of Birth: 1961-09-03  Subjective/Objective:     54 yr old female s/p left ankle Fracture with closed reduction 8/12 and ORIF 07/04/15         Action/Plan:  Patient is for shortterm rehab at [facility]. Social worker has arranged, patient has selected [name].  Expected Discharge Date:    819/16              Expected Discharge Plan:  Skilled Nursing Facility  In-House Referral:  Clinical Social Work  Discharge planning Services     Post Acute Care Choice:  NA Choice offered to:  Patient  DME Arranged:  N/A DME Agency:  NA  HH Arranged:  NA HH Agency:  NA  Status of Service:  Completed, signed off  Medicare Important Message Given:  Yes-second notification given Date Medicare IM Given:    Medicare IM give by:    Date Additional Medicare IM Given:    Additional Medicare Important Message give by:     If discussed at Long Length of Stay Meetings, dates discussed:    Additional Comments:  Durenda Guthrie, RN 07/06/2015, 12:29 PM

## 2015-07-07 DIAGNOSIS — I10 Essential (primary) hypertension: Secondary | ICD-10-CM

## 2015-07-07 DIAGNOSIS — E1310 Other specified diabetes mellitus with ketoacidosis without coma: Secondary | ICD-10-CM

## 2015-07-07 LAB — CBC
HEMATOCRIT: 31.4 % — AB (ref 36.0–46.0)
Hemoglobin: 9.9 g/dL — ABNORMAL LOW (ref 12.0–15.0)
MCH: 24.9 pg — AB (ref 26.0–34.0)
MCHC: 31.5 g/dL (ref 30.0–36.0)
MCV: 79.1 fL (ref 78.0–100.0)
Platelets: 255 10*3/uL (ref 150–400)
RBC: 3.97 MIL/uL (ref 3.87–5.11)
RDW: 19.8 % — AB (ref 11.5–15.5)
WBC: 7.9 10*3/uL (ref 4.0–10.5)

## 2015-07-07 LAB — GLUCOSE, CAPILLARY
GLUCOSE-CAPILLARY: 250 mg/dL — AB (ref 65–99)
GLUCOSE-CAPILLARY: 217 mg/dL — AB (ref 65–99)
Glucose-Capillary: 376 mg/dL — ABNORMAL HIGH (ref 65–99)

## 2015-07-07 LAB — PROTIME-INR
INR: 1.21 (ref 0.00–1.49)
Prothrombin Time: 15.5 seconds — ABNORMAL HIGH (ref 11.6–15.2)

## 2015-07-07 LAB — HEPARIN LEVEL (UNFRACTIONATED): Heparin Unfractionated: 0.53 IU/mL (ref 0.30–0.70)

## 2015-07-07 MED ORDER — WARFARIN SODIUM 5 MG PO TABS
10.0000 mg | ORAL_TABLET | Freq: Once | ORAL | Status: AC
  Administered 2015-07-07: 10 mg via ORAL
  Filled 2015-07-07: qty 2

## 2015-07-07 MED ORDER — INSULIN DETEMIR 100 UNIT/ML ~~LOC~~ SOLN
20.0000 [IU] | Freq: Two times a day (BID) | SUBCUTANEOUS | Status: DC
  Administered 2015-07-07 – 2015-07-08 (×2): 20 [IU] via SUBCUTANEOUS
  Filled 2015-07-07 (×3): qty 0.2

## 2015-07-07 MED ORDER — HEPARIN (PORCINE) IN NACL 100-0.45 UNIT/ML-% IJ SOLN
1150.0000 [IU]/h | INTRAMUSCULAR | Status: DC
  Administered 2015-07-07: 1150 [IU]/h via INTRAVENOUS
  Filled 2015-07-07 (×2): qty 250

## 2015-07-07 MED ORDER — HEPARIN BOLUS VIA INFUSION
3000.0000 [IU] | Freq: Once | INTRAVENOUS | Status: AC
  Administered 2015-07-07: 3000 [IU] via INTRAVENOUS
  Filled 2015-07-07: qty 3000

## 2015-07-07 NOTE — Progress Notes (Signed)
Physical Therapy Treatment Patient Details Name: Madison [name] MRN: [id number] DOB: 24-Jun-1961 Today's Date: 07/07/2015    History of Present Illness Patient is a 54 y/o female presents with left ankle fracture s/p mechanical fall at home, DKA, mild hyperkalemia and acute on chronic kidney disease. Now s/p ORIF left ankle. PMH includes DM, CAD, aortic stenosis, HTN, MI, anxiety, depression, CHF, RA.    PT Comments    Patient continues to be anxious about mobility and unable to maintain NWB through the LLE with transfers. Continue to recommend SNF for ongoing Physical Therapy.     Follow Up Recommendations  SNF;Supervision/Assistance - 24 hour     Equipment Recommendations  Wheelchair (measurements PT);Wheelchair cushion (measurements PT)    Recommendations for Other Services       Precautions / Restrictions Precautions Precautions: Fall Restrictions LLE Weight Bearing: Non weight bearing    Mobility  Bed Mobility Overal bed mobility: Needs Assistance Bed Mobility: Rolling;Supine to Sit Rolling: Min assist         General bed mobility comments: Min A for LLE positioning. Heavy use of rails  Transfers Overall transfer level: Needs assistance         Squat pivot transfers: Total assist;+2 physical assistance     General transfer comment: Patient continues to be very fearful of falling. Patient able to transfer from bed to [facility] to recliner with A for support and to shift hips over to surface. Patient difficult time maintain NWB on LLE  Ambulation/Gait             General Gait Details: unable to attempt   Stairs            Wheelchair Mobility    Modified Rankin (Stroke Patients Only)       Balance     Sitting balance-Leahy Scale: Fair       Standing balance-Leahy Scale: Zero                      Cognition Arousal/Alertness: Awake/alert Behavior During Therapy: Anxious Overall Cognitive Status: Within Functional Limits for tasks  assessed                      Exercises      General Comments        Pertinent Vitals/Pain Faces Pain Scale: Hurts little more Pain Location: L ankle Pain Descriptors / Indicators: Throbbing Pain Intervention(s): Monitored during session;Repositioned    Home Living                      Prior Function            PT Goals (current goals can now be found in the care plan section) Progress towards PT goals: Progressing toward goals    Frequency  Min 3X/week    PT Plan Current plan remains appropriate    Co-evaluation             End of Session Equipment Utilized During Treatment: Gait belt Activity Tolerance: Other (comment) (anxious) Patient left: in chair;with call bell/phone within reach     Time: 2778-2423 PT Time Calculation (min) (ACUTE ONLY): 24 min  Charges:  $Therapeutic Activity: 23-37 mins                    G Codes:      Fredrich Birks 07/07/2015, 12:08 PM 07/07/2015 Fredrich Birks PTA [phone] pager [phone] office

## 2015-07-07 NOTE — Progress Notes (Signed)
Inpatient Diabetes Program Recommendations  AACE/ADA: New Consensus Statement on Inpatient Glycemic Control (2013)  Target Ranges:  Prepandial:   less than 140 mg/dL      Peak postprandial:   less than 180 mg/dL (1-2 hours)      Critically ill patients:  140 - 180 mg/dL   Review of Glycemic Control:  Results for AYLEAH, HOFMEISTER (MRN [id number]) as of 07/07/2015 13:19  Ref. Range 07/06/2015 06:23 07/06/2015 12:32 07/06/2015 16:53 07/06/2015 21:59 07/07/2015 06:55 07/07/2015 12:12  Glucose-Capillary Latest Ref Range: 65-99 mg/dL 962 (H) 952 (H) 841 (H) 153 (H) 250 (H) 376 (H)   Results for MEENA, BARRANTES (MRN [id number]) as of 07/07/2015 13:19  Ref. Range 04/04/2015 15:33 06/06/2015 16:17 07/01/2015 14:48  Hemoglobin A1C Latest Ref Range: 4.8-5.6 % 10.6 (H) 11.6 11.2 (H)   Diabetes history:  Type 2 diabetes Outpatient Diabetes medications: NPH 38 units daily Current orders for Inpatient glycemic control:  Levemir 20 units bid Novolog moderate tid with meals, Novolog 5 units tid with meals    Note that lunch CBG's increased.  Note that patient did not receive Novolog meal coverage on 8/17 or 8/18 with breakfast which likely increased lunch time CBG's.  Text paged MD and discussed with RN.  A1C indicates poor control of blood sugars prior to admit.  Needs follow up with PCP.   Will follow.  Thanks, Beryl Meager, RN, BC-ADM Inpatient Diabetes Coordinator Pager [phone] (8a-5p)

## 2015-07-07 NOTE — Progress Notes (Signed)
[facility] PROGRESS NOTE  Madison [name] EHM:[id number] DOB: [birth year] DOA: 07/01/2015 PCP: [name], NP  Assessment/Plan: Left trimalleolar ankle fracture: Mechanical fall. Patient has had no recent illness, dizziness or falls prior to this one. -Orthopedic surgery consulted. Fracture reduced and patient is s/p surgery on 07/04/15 -Cardiology consulted for preop clearance and management of anticoagulation and antiplatelets therapy. Appreciate assistance and rec's -EKG w/o acute ischemic changes, chest x-ray w/o signs of PNA -ASA and plavix will continue on hold until coumadin therapeutic;  -Supportive care with ice, pain medications, incentive spirometry -Nonweightbearing on LLE  DKA in a type 2 insulin-dependent diabetic -Closely supervised by Dr. Romero Belling as an outpatient -DKA resolved now after IV insulin and IVF's - CBG (last 3)   Recent Labs  07/06/15 1653 07/06/15 2159 07/07/15 0655  GLUCAP 356* 153* 250*    - increased the novlog to 5 units TIDAC yesterday. 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 -will continue B-blocker  -will continue holding ASA,  -ok to resume coumadin per pharmacy -ASA and plavix to be resumed after coumadin therapeutic   Essential hypertension -patient with soft BP on presentation -Will continue holding lisinopril for now; Continue beta blocker. -will change IVF's to maintenance only and if intake remains good in the next 24 hours, will change to NSL -BP better and rising  Chronic kidney disease stage III -improved after IVF's and holding ACE inhibitors  -will monitor and follow  trend after sx; - will resume ACE inhibitors if her bp remains good.   Rheumatoid arthritis -Continue methotrexate   Code Status: Full Family Communication: no family at bedside  Disposition Plan: to snf when INR IS >2   Consultants:  Orthopedic service (Dr. Roda Shutters)  Procedures:  Left ankle surgery done on 07/04/15  2-D echo: pending  Antibiotics:  None   HPI/Subjective: No chest pain, or sob, comfortable.   Objective: Filed Vitals:   07/07/15 0600  BP: 128/52  Pulse: 78  Temp: 98.9 F (37.2 C)  Resp: 18    Intake/Output Summary (Last 24 hours) at 07/07/15 1125 Last data filed at 07/07/15 0800  Gross per 24 hour  Intake    600 ml  Output      0 ml  Net    600 ml   There were no vitals filed for this visit.  Exam:   General:  Feeling ok. Patient sitting on chair and reports even leg still tender is better. No CP, no abd pain, no nausea or vomiting. Afebrile  Cardiovascular: S1 and S2, no rubs or gallops  Respiratory: CTA bilaterally  Abdomen: soft, NT, ND, positive BS  Musculoskeletal: no cyanosis or clubbing. Left leg with dressing in place.  Data Reviewed: Basic Metabolic Panel:  Recent Labs Lab 07/01/15 1448  07/02/15 1222 07/02/15 1642 07/02/15 2028 07/04/15 0242 07/04/15 1502 07/04/15 1620 07/06/15 0614  NA 133*  < > 135 134* 136 139 140 140 134*  K 6.3*  < > 4.5 3.9 3.8 3.7 3.5 3.6 3.7  CL 98*  < > 103 103 103 107  --   --  100*  CO2 15*  < > 19* 23 25 24   --   --  26  GLUCOSE 624*  < > 259* 160* 106* 156* 59* 107* 135*  BUN 30*  < > 15 12 9 6   --   --  8  CREATININE 1.56*  < > 1.16* 1.02* 0.97 0.67  --   --  0.76  CALCIUM 8.9  < > 8.0* 8.5* 8.4* 8.5*  --   --  8.5*  MG 1.9  --   --   --   --   --   --   --   --   < > = values in this interval not displayed.  CBC:  Recent Labs Lab 07/01/15 1025  07/03/15 1920 07/04/15 0242 07/04/15 1502 07/04/15 1620 07/05/15 0648 07/06/15 0614 07/07/15 0710  WBC 12.2*  < > 7.6 7.2  --    --  6.4 7.5 7.9  NEUTROABS 10.7*  --   --   --   --   --   --   --   --   HGB 9.5*  < > 9.7* 7.8* 11.2* 9.5* 8.9* 9.7* 9.9*  HCT 30.4*  < > 31.8* 25.6* 33.0* 28.0* 28.3* 30.8* 31.4*  MCV 74.9*  < > 75.4* 75.7*  --   --  77.3* 77.8* 79.1  PLT 234  < > 263 222  --   --  218 204 255  < > = values in this interval not displayed. Cardiac Enzymes:  Recent Labs Lab 07/01/15 1025  CKTOTAL 42   ProBNP (last 3 results)  Recent Labs  09/30/14 1431  PROBNP 794.3*    CBG:  Recent Labs Lab 07/06/15 0623 07/06/15 1232 07/06/15 1653 07/06/15 2159 07/07/15 0655  GLUCAP 119* 315* 356* 153* 250*    Recent Results (from the past 240 hour(s))  MRSA PCR Screening     Status: Abnormal   Collection Time: 07/01/15  2:48 PM  Result Value Ref Range Status   MRSA by PCR POSITIVE (A) NEGATIVE Final    Comment:        The GeneXpert MRSA Assay (FDA approved for NASAL specimens only), is one component of a comprehensive MRSA colonization surveillance program. It is not intended to diagnose MRSA infection nor to guide or monitor treatment for MRSA infections. RESULT CALLED TO, READ BACK BY AND VERIFIED WITH: 08/31/15 RN 17:00 07/01/15 (wilsonm)      Studies: No results found.  Scheduled Meds: . sodium chloride   Intravenous Once  . baclofen  10 mg Oral BID  . clonazePAM  0.5 mg Oral BID  . darifenacin  7.5 mg Oral Daily  . docusate sodium  100 mg Oral BID  . escitalopram  20 mg Oral Daily  . insulin aspart  0-15 Units Subcutaneous TID WC  . insulin aspart  5 Units Subcutaneous TID WC  . insulin detemir  18 Units Subcutaneous BID  . methotrexate  2.5 mg Oral Once per day on Mon Fri  . metoprolol tartrate  25 mg Oral Daily  . pantoprazole  40 mg Oral Daily  . pravastatin  20 mg Oral Daily  . sodium chloride  3 mL Intravenous Q12H  . Warfarin - Pharmacist Dosing Inpatient   Does not apply q1800   Continuous Infusions: . sodium chloride 20 mL/hr at 07/06/15 0700  . heparin  1,150 Units/hr (07/06/15 1857)    Principal Problem:   Closed left ankle fracture Active Problems:   Rheumatoid arthritis   Chronic anticoagulation   Dysphagia, pharyngoesophageal phase   Spastic hemiplegia affecting nondominant side  Supratherapeutic INR   S/P aortic valve replacement   Essential hypertension   DKA, type 2   Trimalleolar fracture of left ankle   DKA, type 2, not at goal   Hyperkalemia   Acute kidney injury    Time spent: 30 minutes    [facility]  [facility] Pager [phone]. If 7PM-7AM, please contact night-coverage at [email or web address], password [facility] 07/07/2015, 11:25 AM  LOS: 6 days

## 2015-07-07 NOTE — Clinical Social Work Note (Signed)
Disposition: [facility]- semi-private room ready upon discharge Admission Paperwork: to be completed by sister, Ricke Hey to dc: therapeutic INR- SNF updated  Vickii Penna, LCSW [phone]  [facility] (5N 1-8) Clinical Social Worker

## 2015-07-07 NOTE — Progress Notes (Signed)
ANTICOAGULATION CONSULT NOTE - Follow Up Consult  Pharmacy Consult for Heparin/Warfarin Indication: Mechanical aortic valve  Allergies  Allergen Reactions  . Celebrex [Celecoxib] Rash  . Detrol [Tolterodine] Hives    Patient Measurements:   Heparin Dosing Weight: 74kg  Vital Signs: Temp: 98.9 F (37.2 C) (08/18 0600) Temp Source: Oral (08/18 0600) BP: 128/52 mmHg (08/18 0600) Pulse Rate: 78 (08/18 0600)  Labs:  Recent Labs  07/05/15 0648 07/05/15 1620 07/06/15 0614 07/07/15 0710  HGB 8.9*  --  9.7* 9.9*  HCT 28.3*  --  30.8* 31.4*  PLT 218  --  204 255  LABPROT 15.1  --  14.6 15.5*  INR 1.17  --  1.12 1.21  HEPARINUNFRC  --  0.49 0.63 0.53  CREATININE  --   --  0.76  --     CrCl cannot be calculated (Unknown ideal weight.).   Medications:  Scheduled:  . sodium chloride   Intravenous Once  . baclofen  10 mg Oral BID  . clonazePAM  0.5 mg Oral BID  . darifenacin  7.5 mg Oral Daily  . docusate sodium  100 mg Oral BID  . escitalopram  20 mg Oral Daily  . insulin aspart  0-15 Units Subcutaneous TID WC  . insulin aspart  5 Units Subcutaneous TID WC  . insulin detemir  20 Units Subcutaneous BID  . methotrexate  2.5 mg Oral Once per day on Mon Fri  . metoprolol tartrate  25 mg Oral Daily  . pantoprazole  40 mg Oral Daily  . pravastatin  20 mg Oral Daily  . sodium chloride  3 mL Intravenous Q12H  . Warfarin - Pharmacist Dosing Inpatient   Does not apply q1800    Assessment: 54 yo female with history prosthetic aortic valve on warfarin, DM2, RA and CAD presents after mechanical fall and ankle fracture. Pharmacy consulted to resume coumadin, currently bridging with heparin. Patient's PTA regimen Coumadin 7.5 mg daily and 5 mg on Mon/Fri  H&H 9.7/30.8, PLT 204 INR 1.21 Heparin level therapeutic on 1150 units/hr  Goal of Therapy:  INR 2.5-3.5 Heparin level = 0.3-0.7 Monitor platelets by anticoagulation protocol: Yes   Plan:  Plan: - Continue heparin at  1150 units/hr - Coumadin 10mg  tonight x 1 dose - Monitor daily heparin level - Monitor daily INR, s/s bleeding, CBC  Thank you  , Pharm.D., BCPS Clinical Pharmacist Pager [phone] 07/07/2015 12:50 PM

## 2015-07-08 ENCOUNTER — Ambulatory Visit (HOSPITAL_COMMUNITY): Payer: Medicaid Other

## 2015-07-08 LAB — BASIC METABOLIC PANEL
Anion gap: 10 (ref 5–15)
BUN: 10 mg/dL (ref 6–20)
CHLORIDE: 103 mmol/L (ref 101–111)
CO2: 24 mmol/L (ref 22–32)
CREATININE: 0.79 mg/dL (ref 0.44–1.00)
Calcium: 8.8 mg/dL — ABNORMAL LOW (ref 8.9–10.3)
GFR calc Af Amer: >60 mL/min (ref >60)
GFR calc non Af Amer: >60 mL/min (ref >60)
Glucose, Bld: 72 mg/dL (ref 65–99)
Potassium: 3.5 mmol/L (ref 3.5–5.1)
SODIUM: 137 mmol/L (ref 135–145)

## 2015-07-08 LAB — CBC
HEMATOCRIT: 30 % — AB (ref 36.0–46.0)
Hemoglobin: 9.4 g/dL — ABNORMAL LOW (ref 12.0–15.0)
MCH: 24.5 pg — ABNORMAL LOW (ref 26.0–34.0)
MCHC: 31.3 g/dL (ref 30.0–36.0)
MCV: 78.3 fL (ref 78.0–100.0)
PLATELETS: 270 10*3/uL (ref 150–400)
RBC: 3.83 MIL/uL — ABNORMAL LOW (ref 3.87–5.11)
RDW: 20.1 % — AB (ref 11.5–15.5)
WBC: 8.2 10*3/uL (ref 4.0–10.5)

## 2015-07-08 LAB — TYPE AND SCREEN
ABO/RH(D): A POS
ANTIBODY SCREEN: NEGATIVE
UNIT DIVISION: 0
Unit division: 0
Unit division: 0

## 2015-07-08 LAB — GLUCOSE, CAPILLARY
GLUCOSE-CAPILLARY: 59 mg/dL — AB (ref 65–99)
GLUCOSE-CAPILLARY: 77 mg/dL (ref 65–99)
Glucose-Capillary: 78 mg/dL (ref 65–99)
Glucose-Capillary: 243 mg/dL — ABNORMAL HIGH (ref 65–99)
Glucose-Capillary: 220 mg/dL — ABNORMAL HIGH (ref 65–99)
Glucose-Capillary: 55 mg/dL — ABNORMAL LOW (ref 65–99)
Glucose-Capillary: 156 mg/dL — ABNORMAL HIGH (ref 65–99)

## 2015-07-08 LAB — PROTIME-INR
INR: 1.32 (ref 0.00–1.49)
Prothrombin Time: 16.5 seconds — ABNORMAL HIGH (ref 11.6–15.2)

## 2015-07-08 LAB — HEPARIN LEVEL (UNFRACTIONATED): Heparin Unfractionated: 0.39 IU/mL (ref 0.30–0.70)

## 2015-07-08 MED ORDER — TRAZODONE HCL 50 MG PO TABS: 25.0000 mg | ORAL_TABLET | Freq: Every evening | ORAL | Status: DC | PRN

## 2015-07-08 MED ORDER — ASPIRIN 81 MG PO CHEW
81.0000 mg | CHEWABLE_TABLET | Freq: Every day | ORAL | Status: DC
  Administered 2015-07-08 – 2015-07-09 (×2): 81 mg via ORAL
  Filled 2015-07-08 (×2): qty 1

## 2015-07-08 MED ORDER — INSULIN DETEMIR 100 UNIT/ML ~~LOC~~ SOLN
18.0000 [IU] | Freq: Two times a day (BID) | SUBCUTANEOUS | Status: DC
  Administered 2015-07-09: 18 [IU] via SUBCUTANEOUS
  Filled 2015-07-08 (×3): qty 0.18

## 2015-07-08 MED ORDER — INSULIN DETEMIR 100 UNIT/ML ~~LOC~~ SOLN: 18.0000 [IU] | Freq: Two times a day (BID) | SUBCUTANEOUS | Status: DC

## 2015-07-08 MED ORDER — INSULIN ASPART 100 UNIT/ML ~~LOC~~ SOLN
3.0000 [IU] | Freq: Three times a day (TID) | SUBCUTANEOUS | Status: DC
  Administered 2015-07-08 – 2015-07-09 (×3): 3 [IU] via SUBCUTANEOUS

## 2015-07-08 MED ORDER — INSULIN ASPART 100 UNIT/ML ~~LOC~~ SOLN: 3.0000 [IU] | Freq: Three times a day (TID) | SUBCUTANEOUS | Status: DC

## 2015-07-08 MED ORDER — ENOXAPARIN SODIUM 80 MG/0.8ML ~~LOC~~ SOLN: 80.0000 mg | Freq: Two times a day (BID) | SUBCUTANEOUS | Status: DC

## 2015-07-08 MED ORDER — ENOXAPARIN SODIUM 80 MG/0.8ML ~~LOC~~ SOLN
80.0000 mg | Freq: Two times a day (BID) | SUBCUTANEOUS | Status: DC
  Administered 2015-07-09: 80 mg via SUBCUTANEOUS
  Filled 2015-07-08 (×4): qty 0.8

## 2015-07-08 MED ORDER — INSULIN ASPART 100 UNIT/ML ~~LOC~~ SOLN: 0.0000 [IU] | Freq: Three times a day (TID) | SUBCUTANEOUS | Status: DC

## 2015-07-08 MED ORDER — CLONAZEPAM 0.5 MG PO TABS: 0.5000 mg | ORAL_TABLET | Freq: Two times a day (BID) | ORAL | Status: DC

## 2015-07-08 MED ORDER — WARFARIN SODIUM 7.5 MG PO TABS
12.5000 mg | ORAL_TABLET | Freq: Once | ORAL | Status: AC
  Administered 2015-07-08: 12.5 mg via ORAL
  Filled 2015-07-08 (×2): qty 1

## 2015-07-08 MED ORDER — ENOXAPARIN SODIUM 80 MG/0.8ML ~~LOC~~ SOLN
80.0000 mg | Freq: Once | SUBCUTANEOUS | Status: AC
  Administered 2015-07-08: 80 mg via SUBCUTANEOUS
  Filled 2015-07-08: qty 0.8

## 2015-07-08 NOTE — Progress Notes (Signed)
Hypoglycemic Event  CBG: 55  Treatment: 15 GM carbohydrate snack x2 (orange juice)  Symptoms: None  Follow-up CBG: Time: 2135 CBG Result: 59 - given patient regular soda CBG - 77 after recheck at 2145  Possible Reasons for Event: Unknown  Comments/MD notified:     Ester, Hilley  Remember to initiate Hypoglycemia Order Set & complete

## 2015-07-08 NOTE — Progress Notes (Addendum)
ANTICOAGULATION CONSULT NOTE - Follow Up Consult  Pharmacy Consult for Heparin/Warfarin Indication: Mechanical aortic valve  Allergies  Allergen Reactions  . Celebrex [Celecoxib] Rash  . Detrol [Tolterodine] Hives    Patient Measurements:   Heparin Dosing Weight: 74kg  Vital Signs: Temp: 98.6 F (37 C) (08/19 0645) Temp Source: Oral (08/19 0645) BP: 116/57 mmHg (08/19 0645) Pulse Rate: 71 (08/19 0645)  Labs:  Recent Labs  07/06/15 0614 07/07/15 0710 07/08/15 0355 07/08/15 0450  HGB 9.7* 9.9*  --  9.4*  HCT 30.8* 31.4*  --  30.0*  PLT 204 255  --  270  LABPROT 14.6 15.5* 16.5*  --   INR 1.12 1.21 1.32  --   HEPARINUNFRC 0.63 0.53 0.39  --   CREATININE 0.76  --   --  0.79    CrCl cannot be calculated (Unknown ideal weight.).   Medications:  Scheduled:  . sodium chloride   Intravenous Once  . aspirin  81 mg Oral Daily  . baclofen  10 mg Oral BID  . clonazePAM  0.5 mg Oral BID  . darifenacin  7.5 mg Oral Daily  . docusate sodium  100 mg Oral BID  . escitalopram  20 mg Oral Daily  . insulin aspart  0-15 Units Subcutaneous TID WC  . insulin aspart  3 Units Subcutaneous TID WC  . insulin detemir  18 Units Subcutaneous BID  . methotrexate  2.5 mg Oral Once per day on Mon Fri  . metoprolol tartrate  25 mg Oral Daily  . pantoprazole  40 mg Oral Daily  . pravastatin  20 mg Oral Daily  . sodium chloride  3 mL Intravenous Q12H  . Warfarin - Pharmacist Dosing Inpatient   Does not apply q1800    Assessment: 54 yo female with history prosthetic aortic valve on warfarin, DM2, RA and CAD presents after mechanical fall and ankle fracture. Pharmacy consulted to resume coumadin, currently bridging with heparin. Patient's PTA regimen Coumadin 7.5 mg daily and 5 mg on Mon/Fri  H&H 9.4/30, PLT 270 INR 1.21 > 1.32, slow to rise due to Vit K, all doses charted HL therapeutic on 1150 units/hr  Goal of Therapy:  INR 2.5-3.5 Heparin level = 0.3-0.7 Monitor platelets by  anticoagulation protocol: Yes   Plan:  Plan: - Continue heparin at 1150 units/hr - Increase Coumadin 12.5 mg tonight x 1 dose - Monitor daily heparin level - Monitor daily INR, s/s bleeding, CBC  Thank you  [employer], Pharm.D., BCPS Clinical Pharmacist Pager [phone] 07/08/2015 11:31 AM   Addendum: Asked to transition patient from heparin to Lovenox in anticipation of discharge home on bridge therapy.  Pts CrCl > 30 ml/min.  Would recommend 1mg /kg q12h dosing for bridge therapy.  Plan:  Weigh patient - was 75kg last admission (July 2016) Begin Lovenox 80mg  SQ q12h.  First dose to be given 1 hour after heparin infusion stopped. Monitor for signs and symptoms of bleeding.  12-13-1991, Pharm.D., BCPS Clinical Pharmacist Pager [phone] 07/08/2015 2:27 PM

## 2015-07-08 NOTE — Discharge Summary (Signed)
Physician Discharge Summary  Madison [name] JSR:[id number] DOB: 1961/07/20 DOA: 07/01/2015  PCP: [name], NP  Admit date: 07/01/2015 Discharge date: 07/08/2015  Time spent: 30 minutes  Recommendations for Outpatient Follow-up:  1. Follow up with PCP in one week.  2. Check INR daily to keep it between 2.5 to 3.5.  3. Started lovenox to bridge, stop lovenox when INR is greater than 2.5.  4. Follow up with orthopedics as recommended  Discharge Diagnoses:  Principal Problem:   Closed left ankle fracture Active Problems:   Rheumatoid arthritis   Chronic anticoagulation   Dysphagia, pharyngoesophageal phase   Spastic hemiplegia affecting nondominant side   Supratherapeutic INR   S/P aortic valve replacement   Essential hypertension   DKA, type 2   Trimalleolar fracture of left ankle   DKA, type 2, not at goal   Hyperkalemia   Acute kidney injury   Discharge Condition: improved  Diet recommendation: LOW sodium carb modified diet  There were no vitals filed for this visit.  History of present illness:  54 year old female patient with history of type II DM/IDDM (Dr. Romero Belling, endocrinology), bicuspid AV with prosthetic AVR 2012 on chronic Coumadin (Dr. 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 Patient has had no recent illness, dizziness or falls prior to this one. -Orthopedic surgery consulted. Fracture reduced and patient is s/p surgery on 07/04/15 -Cardiology consulted for preop clearance and management of anticoagulation and antiplatelets therapy. Appreciate assistance and rec's -EKG w/o acute ischemic changes, chest x-ray w/o signs of PNA -Nonweightbearing on  LLE  DKA in a type 2 insulin-dependent diabetic -Closely supervised by Dr. Romero Belling as an outpatient -DKA resolved now after IV insulin and IVF's. On 18 units of levemir with SSI.  Marland Kitchen CBG (last 3)   Recent Labs  07/07/15 1631 07/08/15 0709 07/08/15 1212  GLUCAP 217* 78 243*            -  Mechanical aortic valve replacement -On chronic Coumadin therapy for this  -will resume coumadin per pharmacy and continue bridging with lovenox. Stop lovenox when INR is greater than 2.5.  -INR goal 2.5-3.5  Coronary artery disease with cerebral vascular disease -History of 5 strokes with residual left upper extremity weakness and some slurring of speech. -will continue B-blocker  -resume aspirin. -ok to resume coumadin per pharmacy   Essential hypertension -patient with soft BP on presentation -much improved today.   Chronic kidney disease stage III -improved after IVF's and holding ACE inhibitors  -will monitor and follow trend after sx; - will resume ACE inhibitors on discharge.   Rheumatoid arthritis -Continue methotrexate       Procedures:  Left ankle surgery done on 07/04/15  Consultations:    Orthopedic service (Dr. Roda Shutters) Discharge Exam: Filed Vitals:   07/08/15 1300  BP: 114/52  Pulse: 76  Temp: 98.8 F (37.1 C)  Resp: 18    General: ALERT AFEBRILE comfortable Cardiovascular: s1s2 Respiratory: ctab  Discharge Instructions   Discharge Instructions    Diet - low sodium heart healthy    Complete by:  As directed      Discharge instructions    Complete by:  As directed   Follow up with  orthopedics as recommended.  Follow up with INR daily to keep it between 2.5 and 3.5.  Stop the lovenox when INR is greater than 2.          Current Discharge Medication List    START taking these medications   Details  enoxaparin (LOVENOX) 80 MG/0.8ML injection Inject 0.8 mLs (80 mg total) into the skin every 12 (twelve) hours. Qty: 10 Syringe,  Refills: 0    HYDROcodone-acetaminophen (NORCO) 7.5-325 MG per tablet Take 1-2 tablets by mouth every 6 (six) hours as needed for moderate pain. Qty: 90 tablet, Refills: 0    insulin aspart (NOVOLOG) 100 UNIT/ML injection Inject 0-15 Units into the skin 3 (three) times daily with meals. Qty: 10 mL, Refills: 11    insulin detemir (LEVEMIR) 100 UNIT/ML injection Inject 0.18 mLs (18 Units total) into the skin 2 (two) times daily. Qty: 10 mL, Refills: 11      CONTINUE these medications which have CHANGED   Details  clonazePAM (KLONOPIN) 0.5 MG tablet Take 1 tablet (0.5 mg total) by mouth 2 (two) times daily. Qty: 10 tablet, Refills: 0    traZODone (DESYREL) 50 MG tablet Take 0.5-1 tablets (25-50 mg total) by mouth at bedtime as needed for sleep. Qty: 10 tablet, Refills: 0   Associated Diagnoses: Insomnia      CONTINUE these medications which have NOT CHANGED   Details  aspirin 81 MG tablet Take 1 tablet (81 mg total) by mouth daily. Qty: 30 tablet    baclofen (LIORESAL) 10 MG tablet TAKE 1 TABLET (10 MG TOTAL) BY MOUTH 2 (TWO) TIMES DAILY. Qty: 60 tablet, Refills: 1    escitalopram (LEXAPRO) 20 MG tablet Take 20 mg by mouth daily.    lisinopril (PRINIVIL,ZESTRIL) 2.5 MG tablet TAKE 1 TABLET BY MOUTH EVERY DAY Qty: 30 tablet, Refills: 5    metoprolol tartrate (LOPRESSOR) 25 MG tablet Take 0.5 tablets (12.5 mg total) by mouth 2 (two) times daily. Qty: 60 tablet, Refills: 0    omeprazole (PRILOSEC) 40 MG capsule TAKE 1 CAPSULE (40 MG TOTAL) BY MOUTH DAILY. Qty: 30 capsule, Refills: 6    pravastatin (PRAVACHOL) 20 MG tablet TAKE 1 TABLET BY MOUTH EVERY DAY Qty: 90 tablet, Refills: 0    RHEUMATREX 2.5 MG tablet TAKE 1 TABLET 2 TIMES A WEEK Qty: 24 tablet, Refills: 1    solifenacin (VESICARE) 5 MG tablet Take 1 tablet (5 mg total) by mouth daily. Qty: 90 tablet, Refills: 2    warfarin (COUMADIN) 5 MG tablet TAKE 1-1.5 BY MOUTH DAILY AT 6PM. TAKE 1 TAB DAILY TUES,TH,F,SAT &  SUN. TAKE 1& 1/2 TAB ON MON &WED Qty: 60 tablet, Refills: 0    zolpidem (AMBIEN) 10 MG tablet Take 10 mg by mouth at bedtime as needed. for sleep Refills: 1    glucagon (GLUCAGON EMERGENCY) 1 MG injection Inject 1 mg into the vein once as needed. Qty: 1 each, Refills: 5    glucose blood (ACCU-CHEK AVIVA) test strip Use to check blood sugar 5 times per day Dx Code E10.10 And lancets 5/day Qty: 150 each, Refills: 11      STOP taking these medications     atorvastatin (LIPITOR) 20 MG tablet      Insulin NPH, Human,, Isophane, (HUMULIN N) 100 UNIT/ML Kiwkpen      clopidogrel (PLAVIX) 75 MG tablet        Allergies  Allergen Reactions  . Celebrex [Celecoxib] Rash  . Detrol [Tolterodine] Hives   Follow-up Information  Follow up with Cheral Almas, MD In 2 weeks.   Specialty:  Orthopedic Surgery   Why:  For suture removal, For wound re-check   Contact information:   [address] [place] [address] [phone]       Follow up with [name], NP. Schedule an appointment as soon as possible for a visit in 1 week.   Specialty:  Family Medicine   Contact information:   [address] [place] [address] [phone]        The results of significant diagnostics from this hospitalization (including imaging, microbiology, ancillary and laboratory) are listed below for reference.    Significant Diagnostic Studies: Dg Ankle Complete Left  07/30/15   CLINICAL DATA:  ORIF left ankle.  EXAM: DG C-ARM 61-120 MIN; LEFT ANKLE COMPLETE - 3+ VIEW  COMPARISON:  07/01/2015  FLUOROSCOPY TIME:  0 minutes 25 seconds.  Four images.  FINDINGS: Examination demonstrates interval reduction and fixation of patient's fracture dislocation at the ankle. There is a lateral fixation plate with screws bridging patient's distal fibular fracture with hardware intact and anatomic alignment over the fracture site. There are 2 orthopedic screws extending from the inferior to  superior bridging patient's medial malleolar fracture intact with anatomic alignment over the fracture site. There is anatomic alignment about the ankle mortise. Evidence of patient's posterior malleolar fracture without significant displacement. Remainder of the exam is unchanged.  IMPRESSION: Fixation and reduction of patient's ankle fracture dislocation with hardware bridging patient's distal fibular fracture and medial malleolar fracture intact. Normal alignment about the fracture sites and ankle mortise.   Electronically Signed   By: Elberta Fortis M.D.   On: 07-30-15 17:08   Dg Ankle Complete Left  07/01/2015   CLINICAL DATA:  Postreduction imaging. Patient with left ankle fracture dislocation.  EXAM: LEFT ANKLE COMPLETE - 3+ VIEW  COMPARISON:  07/01/2015 at 9:43 a.m.  FINDINGS: Trimalleolar fracture has been reduced. The fracture components are in near-anatomic alignment and the talus is now normally aligned with the tibia. Ankle was encased in a plaster cast.  IMPRESSION: Well-aligned fracture fragments and normally aligned ankle mortise following close reduction.   Electronically Signed   By: Amie Portland M.D.   On: 07/01/2015 11:32   Dg Ankle Complete Left  07/01/2015   CLINICAL DATA:  Larey Seat out of bed, caught ankle in sheets, having deformity, swelling and bruising to LEFT ankle  EXAM: LEFT ANKLE COMPLETE - 3+ VIEW  COMPARISON:  None  FINDINGS: Osseous demineralization.  Scattered soft tissue swelling.  Oblique fracture of medial malleolus displaced laterally.  Oblique fracture of lateral malleolus displaced laterally.  Lateral and posterior tibiotalar dislocation.  Displaced posterior malleolar fracture fragment present as well.  Associated ankle deformity.  Intertarsal alignments appear grossly preserved.  IMPRESSION: Trimalleolar fracture-dislocation LEFT ankle as above.   Electronically Signed   By: Ulyses Southward M.D.   On: 07/01/2015 10:19   Ct Ankle Left Wo Contrast  07/01/2015   CLINICAL  DATA:  Try malleolar fracture of the left ankle.  EXAM: CT OF THE LEFT ANKLE WITHOUT CONTRAST  TECHNIQUE: Multidetector CT imaging of the left ankle was performed according to the standard protocol. Multiplanar CT image reconstructions were also generated.  COMPARISON:  Radiographs dated 07/01/2015  FINDINGS: The fracture dislocation has been reduced. There is near anatomic alignment and position of the try malleolar fracture fragments. Ankle mortise has been restored.  The posterior malleolar fragment involves only yet 9 mm of the  posterior articular surface. There is 1.5 mm of the articular surface step-off at the posterior malleolar fragment.  IMPRESSION: Near anatomic alignment of the try malleolar fracture. Slight step-off of the articular surface at the posterior malleolar fracture. Tiny bone fragments between the distal tibia and fibula at the level of the dome of the talus.   Electronically Signed   By: Francene Boyers M.D.   On: 07/01/2015 12:28   Portable Chest X-ray (1 View)  07/01/2015   CLINICAL DATA:  Preop for left ankle fracture. History of diabetes, CABG.  EXAM: PORTABLE CHEST - 1 VIEW  COMPARISON:  01/19/2015  FINDINGS: Status post median sternotomy. Heart size is normal. Lungs are clear. No pulmonary edema. There is gaseous distension of the stomach.  IMPRESSION: No active disease.   Electronically Signed   By: Norva Pavlov M.D.   On: 07/01/2015 13:25   Dg C-arm 1-60 Min  07/04/2015   CLINICAL DATA:  ORIF left ankle.  EXAM: DG C-ARM 61-120 MIN; LEFT ANKLE COMPLETE - 3+ VIEW  COMPARISON:  07/01/2015  FLUOROSCOPY TIME:  0 minutes 25 seconds.  Four images.  FINDINGS: Examination demonstrates interval reduction and fixation of patient's fracture dislocation at the ankle. There is a lateral fixation plate with screws bridging patient's distal fibular fracture with hardware intact and anatomic alignment over the fracture site. There are 2 orthopedic screws extending from the inferior to  superior bridging patient's medial malleolar fracture intact with anatomic alignment over the fracture site. There is anatomic alignment about the ankle mortise. Evidence of patient's posterior malleolar fracture without significant displacement. Remainder of the exam is unchanged.  IMPRESSION: Fixation and reduction of patient's ankle fracture dislocation with hardware bridging patient's distal fibular fracture and medial malleolar fracture intact. Normal alignment about the fracture sites and ankle mortise.   Electronically Signed   By: Elberta Fortis M.D.   On: 07/04/2015 17:08    Microbiology: Recent Results (from the past 240 hour(s))  MRSA PCR Screening     Status: Abnormal   Collection Time: 07/01/15  2:48 PM  Result Value Ref Range Status   MRSA by PCR POSITIVE (A) NEGATIVE Final    Comment:        The GeneXpert MRSA Assay (FDA approved for NASAL specimens only), is one component of a comprehensive MRSA colonization surveillance program. It is not intended to diagnose MRSA infection nor to guide or monitor treatment for MRSA infections. RESULT CALLED TO, READ BACK BY AND VERIFIED WITH: MLawernce Ion RN 17:00 07/01/15 ([email or web address])      Labs: Basic Metabolic Panel:  Recent Labs Lab 07/02/15 1642 07/02/15 2028 07/04/15 0242 07/04/15 1502 07/04/15 1620 07/06/15 0614 07/08/15 0450  NA 134* 136 139 140 140 134* 137  K 3.9 3.8 3.7 3.5 3.6 3.7 3.5  CL 103 103 107  --   --  100* 103  CO2 23 25 24   --   --  26 24  GLUCOSE 160* 106* 156* 59* 107* 135* 72  BUN 12 9 6   --   --  8 10  CREATININE 1.02* 0.97 0.67  --   --  0.76 0.79  CALCIUM 8.5* 8.4* 8.5*  --   --  8.5* 8.8*   Liver Function Tests: No results for input(s): AST, ALT, ALKPHOS, BILITOT, PROT, ALBUMIN in the last 168 hours. No results for input(s): LIPASE, AMYLASE in the last 168 hours. No results for input(s): AMMONIA in the last 168 hours. CBC:  Recent Labs Lab 07/04/15 0242  07/04/15 1620 07/05/15 0648  07/06/15 0614 07/07/15 0710 07/08/15 0450  WBC 7.2  --   --  6.4 7.5 7.9 8.2  HGB 7.8*  < > 9.5* 8.9* 9.7* 9.9* 9.4*  HCT 25.6*  < > 28.0* 28.3* 30.8* 31.4* 30.0*  MCV 75.7*  --   --  77.3* 77.8* 79.1 78.3  PLT 222  --   --  218 204 255 270  < > = values in this interval not displayed. Cardiac Enzymes: No results for input(s): CKTOTAL, CKMB, CKMBINDEX, TROPONINI in the last 168 hours. BNP: BNP (last 3 results) No results for input(s): BNP in the last 8760 hours.  ProBNP (last 3 results)  Recent Labs  09/30/14 1431  PROBNP 794.3*    CBG:  Recent Labs Lab 07/07/15 0655 07/07/15 1212 07/07/15 1631 07/08/15 0709 07/08/15 1212  GLUCAP 250* 376* 217* 78 243*       Signed:  Autum Benfer  [facility] 07/08/2015, 3:17 PM

## 2015-07-09 DIAGNOSIS — S82852D Displaced trimalleolar fracture of left lower leg, subsequent encounter for closed fracture with routine healing: Secondary | ICD-10-CM | POA: Diagnosis not present

## 2015-07-09 DIAGNOSIS — Z9181 History of falling: Secondary | ICD-10-CM | POA: Diagnosis not present

## 2015-07-09 DIAGNOSIS — I10 Essential (primary) hypertension: Secondary | ICD-10-CM | POA: Diagnosis not present

## 2015-07-09 DIAGNOSIS — D6489 Other specified anemias: Secondary | ICD-10-CM | POA: Diagnosis not present

## 2015-07-09 DIAGNOSIS — I5030 Unspecified diastolic (congestive) heart failure: Secondary | ICD-10-CM | POA: Diagnosis not present

## 2015-07-09 DIAGNOSIS — E119 Type 2 diabetes mellitus without complications: Secondary | ICD-10-CM | POA: Diagnosis not present

## 2015-07-09 DIAGNOSIS — M069 Rheumatoid arthritis, unspecified: Secondary | ICD-10-CM | POA: Diagnosis not present

## 2015-07-09 DIAGNOSIS — F3340 Major depressive disorder, recurrent, in remission, unspecified: Secondary | ICD-10-CM | POA: Diagnosis not present

## 2015-07-09 DIAGNOSIS — Z4789 Encounter for other orthopedic aftercare: Secondary | ICD-10-CM | POA: Diagnosis not present

## 2015-07-09 DIAGNOSIS — I2510 Atherosclerotic heart disease of native coronary artery without angina pectoris: Secondary | ICD-10-CM | POA: Diagnosis not present

## 2015-07-09 DIAGNOSIS — R2689 Other abnormalities of gait and mobility: Secondary | ICD-10-CM | POA: Diagnosis not present

## 2015-07-09 DIAGNOSIS — S82855D Nondisplaced trimalleolar fracture of left lower leg, subsequent encounter for closed fracture with routine healing: Secondary | ICD-10-CM | POA: Diagnosis not present

## 2015-07-09 DIAGNOSIS — N183 Chronic kidney disease, stage 3 (moderate): Secondary | ICD-10-CM | POA: Diagnosis not present

## 2015-07-09 DIAGNOSIS — E1122 Type 2 diabetes mellitus with diabetic chronic kidney disease: Secondary | ICD-10-CM | POA: Diagnosis not present

## 2015-07-09 DIAGNOSIS — M6281 Muscle weakness (generalized): Secondary | ICD-10-CM | POA: Diagnosis not present

## 2015-07-09 DIAGNOSIS — M21272 Flexion deformity, left ankle and toes: Secondary | ICD-10-CM | POA: Diagnosis not present

## 2015-07-09 DIAGNOSIS — R1310 Dysphagia, unspecified: Secondary | ICD-10-CM | POA: Diagnosis not present

## 2015-07-09 DIAGNOSIS — I25119 Atherosclerotic heart disease of native coronary artery with unspecified angina pectoris: Secondary | ICD-10-CM | POA: Diagnosis not present

## 2015-07-09 DIAGNOSIS — G8194 Hemiplegia, unspecified affecting left nondominant side: Secondary | ICD-10-CM | POA: Diagnosis not present

## 2015-07-09 DIAGNOSIS — S82851D Displaced trimalleolar fracture of right lower leg, subsequent encounter for closed fracture with routine healing: Secondary | ICD-10-CM | POA: Diagnosis not present

## 2015-07-09 LAB — GLUCOSE, CAPILLARY
GLUCOSE-CAPILLARY: 248 mg/dL — AB (ref 65–99)
GLUCOSE-CAPILLARY: 335 mg/dL — AB (ref 65–99)
Glucose-Capillary: 573 mg/dL (ref 65–99)

## 2015-07-09 LAB — CBC
HEMATOCRIT: 30.2 % — AB (ref 36.0–46.0)
Hemoglobin: 9.6 g/dL — ABNORMAL LOW (ref 12.0–15.0)
MCH: 25 pg — ABNORMAL LOW (ref 26.0–34.0)
MCHC: 31.8 g/dL (ref 30.0–36.0)
MCV: 78.6 fL (ref 78.0–100.0)
Platelets: 280 10*3/uL (ref 150–400)
RBC: 3.84 MIL/uL — ABNORMAL LOW (ref 3.87–5.11)
RDW: 20.2 % — AB (ref 11.5–15.5)
WBC: 5.9 10*3/uL (ref 4.0–10.5)

## 2015-07-09 LAB — PROTIME-INR
INR: 1.65 — ABNORMAL HIGH (ref 0.00–1.49)
Prothrombin Time: 19.5 seconds — ABNORMAL HIGH (ref 11.6–15.2)

## 2015-07-09 MED ORDER — WARFARIN SODIUM 5 MG PO TABS: 10.0000 mg | ORAL_TABLET | Freq: Once | ORAL | Status: DC

## 2015-07-09 NOTE — Clinical Social Work Placement (Signed)
   CLINICAL SOCIAL WORK PLACEMENT  NOTE  Date:  07/09/2015  Patient Details  Name: Madison [name] MRN: [id number] Date of Birth: 1961-02-24  Clinical Social Work is seeking post-discharge placement for this patient at the Skilled  Nursing Facility level of care (*CSW will initial, date and re-position this form in  chart as items are completed):  Yes   Patient/family provided with [facility] Clinical Social Work Department's list of facilities offering this level of care within the geographic area requested by the patient (or if unable, by the patient's family).  Yes   Patient/family informed of their freedom to choose among providers that offer the needed level of care, that participate in Medicare, Medicaid or managed care program needed by the patient, have an available bed and are willing to accept the patient.  Yes   Patient/family informed of [facility]'s ownership interest in [facility] and [facility], as well as of the fact that they are under no obligation to receive care at these facilities.  PASRR submitted to EDS on 07/05/15     PASRR number received on 07/05/15     Existing PASRR number confirmed on  (n/a)     FL2 transmitted to all facilities in geographic area requested by pt/family on 07/05/15     FL2 transmitted to all facilities within larger geographic area on  (n/a)     Patient informed that his/her managed care company has contracts with or will negotiate with certain facilities, including the following:   (yes, [facility])     Yes   Patient/family informed of bed offers received.  Patient chooses bed at  [facility] )     Physician recommends and patient chooses bed at      Patient to be transferred to  [facility] ) on 07/09/15.  Patient to be transferred to facility by  [facility] )     Patient family notified on 07/09/15 of transfer.  Name of family member notified:   (Pt's sister, [name] )      PHYSICIAN Please sign FL2     Additional Comment:    _______________________________________________ Vaughan Browner, LCSW 07/09/2015, 9:24 AM

## 2015-07-09 NOTE — Clinical Social Work Note (Signed)
Clinical Social Worker facilitated patient discharge including contacting patient family (contacted patient's sister however unable to leave voice message) and facility to confirm patient discharge plans.  Clinical information faxed to facility and family agreeable with plan.  CSW arranged ambulance transport via [facility] to [facility].  RN to call report prior to discharge.  DC packet prepared and on chart for transport with number for report.   Clinical Social Worker will sign off for now as social work intervention is no longer needed. Please consult [place] again if new need arises.  Derenda Fennel, MSW, LCSWA [phone] 07/09/2015 10:52 AM

## 2015-07-09 NOTE — Progress Notes (Signed)
Patient left via EMS to [name].

## 2015-07-09 NOTE — Progress Notes (Signed)
ANTICOAGULATION CONSULT NOTE - Follow Up Consult  Pharmacy Consult for Warfarin/Lovenox Indication: Mechanical Aortic Valve  Allergies  Allergen Reactions  . Celebrex [Celecoxib] Rash  . Detrol [Tolterodine] Hives    Patient Measurements: Height: 5\' 3"  (160 cm) Weight: 163 lb (73.936 kg) IBW/kg (Calculated) : 52.4  Vital Signs: Temp: 97.5 F (36.4 C) (08/20 0544) Temp Source: Oral (08/20 0544) BP: 130/58 mmHg (08/20 0544) Pulse Rate: 66 (08/20 0544)  Labs:  Recent Labs  07/07/15 0710 07/08/15 0355 07/08/15 0450 07/09/15 0358  HGB 9.9*  --  9.4* 9.6*  HCT 31.4*  --  30.0* 30.2*  PLT 255  --  270 280  LABPROT 15.5* 16.5*  --  19.5*  INR 1.21 1.32  --  1.65*  HEPARINUNFRC 0.53 0.39  --   --   CREATININE  --   --  0.79  --     Estimated Creatinine Clearance: 77.4 mL/min (by C-G formula based on Cr of 0.79).   Assessment: 55 yoF admitted 07/01/2015 with hx of prosthetic aortic valve on warfarin PTA presents with mechanical fall and ankle fracture. Pharmacy consulted to resume warfarin with Lovenox bridge. H&H wnl, Plts wnl, no s/sx of bleeding noted INR 1.21 > 1.32 > 1.65, slow to rise due to Vit K prior to sx on 8/15  Home Coumadin dose = 7.5mg  daily except 5mg  on MF (per clinic notes 06/20/15)   Goal of Therapy:  INR 2.5 - 3.5 Monitor platelets by anticoagulation protocol: Yes   Plan:  - Continue Lovenox 80 mg SQ q12h  - Coumadin 10 mg tonight x 1 dose - Monitor daily INR, s/s bleeding, CBC - Stop Lovenox once INR >2.5  , PharmD. Clinical Pharmacist Resident Pager: [phone]

## 2015-07-09 NOTE — Discharge Summary (Signed)
Physician Discharge Summary  Madison [name] URK:[id number] DOB: 03-16-1961 DOA: 07/01/2015  PCP: [name], NP  Admit date: 07/01/2015 Discharge date: 07/09/2015  Time spent: 30 minutes  Recommendations for Outpatient Follow-up:  1. Follow up with PCP in one week.  2. Check INR daily to keep it between 2.5 to 3.5.  3. Started lovenox to bridge, stop lovenox when INR is greater than 2.5.  4. Follow up with orthopedics as recommended  Discharge Diagnoses:  Principal Problem:   Closed left ankle fracture Active Problems:   Rheumatoid arthritis   Chronic anticoagulation   Dysphagia, pharyngoesophageal phase   Spastic hemiplegia affecting nondominant side   Supratherapeutic INR   S/P aortic valve replacement   Essential hypertension   DKA, type 2   Trimalleolar fracture of left ankle   DKA, type 2, not at goal   Hyperkalemia   Acute kidney injury   Discharge Condition: improved  Diet recommendation: LOW sodium carb modified diet  Filed Weights   07/08/15 1600  Weight: 73.936 kg (163 lb)    History of present illness:  54 year old female patient with history of type II DM/IDDM (Dr. Romero Belling, endocrinology), bicuspid AV with prosthetic AVR 2012 on chronic Coumadin (Dr. 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 Patient has had no recent illness, dizziness or falls prior to this one. -Orthopedic surgery consulted. Fracture reduced and patient is s/p surgery on 07/04/15 -Cardiology consulted for preop clearance and management of anticoagulation and antiplatelets therapy. Appreciate assistance and rec's -EKG w/o acute ischemic changes, chest x-ray w/o signs of  PNA -Nonweightbearing on LLE  DKA in a type 2 insulin-dependent diabetic -Closely supervised by Dr. Romero Belling as an outpatient -DKA resolved now after IV insulin and IVF's. On 18 units of levemir with SSI.  Marland Kitchen CBG (last 3)   Recent Labs  07/08/15 2210 07/09/15 0048 07/09/15 0601  GLUCAP 156* 248* 335*            -  Mechanical aortic valve replacement -On chronic Coumadin therapy for this  -will resume coumadin per pharmacy and continue bridging with lovenox. Stop lovenox when INR is greater than 2.5.  -INR goal 2.5-3.5  Coronary artery disease with cerebral vascular disease -History of 5 strokes with residual left upper extremity weakness and some slurring of speech. -will continue B-blocker  -resume aspirin. -ok to resume coumadin per pharmacy   Essential hypertension -patient with soft BP on presentation -much improved today.   Chronic kidney disease stage III -improved after IVF's and holding ACE inhibitors  -will monitor and follow trend after sx; - will resume ACE inhibitors on discharge.   Rheumatoid arthritis -Continue methotrexate       Procedures:  Left ankle surgery done on 07/04/15  Consultations:    Orthopedic service (Dr. Roda Shutters) Discharge Exam: Filed Vitals:   07/09/15 0544  BP: 130/58  Pulse: 66  Temp: 97.5 F (36.4 C)  Resp: 18    General: ALERT AFEBRILE comfortable Cardiovascular: s1s2 Respiratory: ctab  Discharge Instructions   Discharge Instructions    Diet - low sodium heart healthy    Complete by:  As directed      Discharge instructions    Complete by:  As  directed   Follow up with orthopedics as recommended.  Follow up with INR daily to keep it between 2.5 and 3.5.  Stop the lovenox when INR is greater than 2.          Current Discharge Medication List    START taking these medications   Details  enoxaparin (LOVENOX) 80 MG/0.8ML injection Inject 0.8 mLs (80 mg total) into the skin every 12 (twelve)  hours. Qty: 10 Syringe, Refills: 0    HYDROcodone-acetaminophen (NORCO) 7.5-325 MG per tablet Take 1-2 tablets by mouth every 6 (six) hours as needed for moderate pain. Qty: 90 tablet, Refills: 0    insulin aspart (NOVOLOG) 100 UNIT/ML injection Inject 0-15 Units into the skin 3 (three) times daily with meals. Qty: 10 mL, Refills: 11    insulin detemir (LEVEMIR) 100 UNIT/ML injection Inject 0.18 mLs (18 Units total) into the skin 2 (two) times daily. Qty: 10 mL, Refills: 11      CONTINUE these medications which have CHANGED   Details  clonazePAM (KLONOPIN) 0.5 MG tablet Take 1 tablet (0.5 mg total) by mouth 2 (two) times daily. Qty: 10 tablet, Refills: 0    traZODone (DESYREL) 50 MG tablet Take 0.5-1 tablets (25-50 mg total) by mouth at bedtime as needed for sleep. Qty: 10 tablet, Refills: 0   Associated Diagnoses: Insomnia      CONTINUE these medications which have NOT CHANGED   Details  aspirin 81 MG tablet Take 1 tablet (81 mg total) by mouth daily. Qty: 30 tablet    baclofen (LIORESAL) 10 MG tablet TAKE 1 TABLET (10 MG TOTAL) BY MOUTH 2 (TWO) TIMES DAILY. Qty: 60 tablet, Refills: 1    escitalopram (LEXAPRO) 20 MG tablet Take 20 mg by mouth daily.    lisinopril (PRINIVIL,ZESTRIL) 2.5 MG tablet TAKE 1 TABLET BY MOUTH EVERY DAY Qty: 30 tablet, Refills: 5    metoprolol tartrate (LOPRESSOR) 25 MG tablet Take 0.5 tablets (12.5 mg total) by mouth 2 (two) times daily. Qty: 60 tablet, Refills: 0    omeprazole (PRILOSEC) 40 MG capsule TAKE 1 CAPSULE (40 MG TOTAL) BY MOUTH DAILY. Qty: 30 capsule, Refills: 6    pravastatin (PRAVACHOL) 20 MG tablet TAKE 1 TABLET BY MOUTH EVERY DAY Qty: 90 tablet, Refills: 0    RHEUMATREX 2.5 MG tablet TAKE 1 TABLET 2 TIMES A WEEK Qty: 24 tablet, Refills: 1    solifenacin (VESICARE) 5 MG tablet Take 1 tablet (5 mg total) by mouth daily. Qty: 90 tablet, Refills: 2    warfarin (COUMADIN) 5 MG tablet TAKE 1-1.5 BY MOUTH DAILY AT 6PM. TAKE 1 TAB  DAILY TUES,TH,F,SAT & SUN. TAKE 1& 1/2 TAB ON MON &WED Qty: 60 tablet, Refills: 0    zolpidem (AMBIEN) 10 MG tablet Take 10 mg by mouth at bedtime as needed. for sleep Refills: 1    glucagon (GLUCAGON EMERGENCY) 1 MG injection Inject 1 mg into the vein once as needed. Qty: 1 each, Refills: 5    glucose blood (ACCU-CHEK AVIVA) test strip Use to check blood sugar 5 times per day Dx Code E10.10 And lancets 5/day Qty: 150 each, Refills: 11      STOP taking these medications     atorvastatin (LIPITOR) 20 MG tablet      Insulin NPH, Human,, Isophane, (HUMULIN N) 100 UNIT/ML Kiwkpen      clopidogrel (PLAVIX) 75 MG tablet        Allergies  Allergen Reactions  . Celebrex [Celecoxib] Rash  . Detrol [Tolterodine]  Hives   Follow-up Information    Follow up with Cheral Almas, MD In 2 weeks.   Specialty:  Orthopedic Surgery   Why:  For suture removal, For wound re-check   Contact information:   [address] [place] [address] [phone]       Follow up with [name], NP. Schedule an appointment as soon as possible for a visit in 1 week.   Specialty:  Family Medicine   Contact information:   [address] [place] [address] [phone]        The results of significant diagnostics from this hospitalization (including imaging, microbiology, ancillary and laboratory) are listed below for reference.    Significant Diagnostic Studies: Dg Ankle Complete Left  26-Jul-2015   CLINICAL DATA:  ORIF left ankle.  EXAM: DG C-ARM 61-120 MIN; LEFT ANKLE COMPLETE - 3+ VIEW  COMPARISON:  07/01/2015  FLUOROSCOPY TIME:  0 minutes 25 seconds.  Four images.  FINDINGS: Examination demonstrates interval reduction and fixation of patient's fracture dislocation at the ankle. There is a lateral fixation plate with screws bridging patient's distal fibular fracture with hardware intact and anatomic alignment over the fracture site. There are 2 orthopedic screws  extending from the inferior to superior bridging patient's medial malleolar fracture intact with anatomic alignment over the fracture site. There is anatomic alignment about the ankle mortise. Evidence of patient's posterior malleolar fracture without significant displacement. Remainder of the exam is unchanged.  IMPRESSION: Fixation and reduction of patient's ankle fracture dislocation with hardware bridging patient's distal fibular fracture and medial malleolar fracture intact. Normal alignment about the fracture sites and ankle mortise.   Electronically Signed   By: Elberta Fortis M.D.   On: July 26, 2015 17:08   Dg Ankle Complete Left  07/01/2015   CLINICAL DATA:  Postreduction imaging. Patient with left ankle fracture dislocation.  EXAM: LEFT ANKLE COMPLETE - 3+ VIEW  COMPARISON:  07/01/2015 at 9:43 a.m.  FINDINGS: Trimalleolar fracture has been reduced. The fracture components are in near-anatomic alignment and the talus is now normally aligned with the tibia. Ankle was encased in a plaster cast.  IMPRESSION: Well-aligned fracture fragments and normally aligned ankle mortise following close reduction.   Electronically Signed   By: Amie Portland M.D.   On: 07/01/2015 11:32   Dg Ankle Complete Left  07/01/2015   CLINICAL DATA:  Larey Seat out of bed, caught ankle in sheets, having deformity, swelling and bruising to LEFT ankle  EXAM: LEFT ANKLE COMPLETE - 3+ VIEW  COMPARISON:  None  FINDINGS: Osseous demineralization.  Scattered soft tissue swelling.  Oblique fracture of medial malleolus displaced laterally.  Oblique fracture of lateral malleolus displaced laterally.  Lateral and posterior tibiotalar dislocation.  Displaced posterior malleolar fracture fragment present as well.  Associated ankle deformity.  Intertarsal alignments appear grossly preserved.  IMPRESSION: Trimalleolar fracture-dislocation LEFT ankle as above.   Electronically Signed   By: Ulyses Southward M.D.   On: 07/01/2015 10:19   Ct Ankle Left Wo  Contrast  07/01/2015   CLINICAL DATA:  Try malleolar fracture of the left ankle.  EXAM: CT OF THE LEFT ANKLE WITHOUT CONTRAST  TECHNIQUE: Multidetector CT imaging of the left ankle was performed according to the standard protocol. Multiplanar CT image reconstructions were also generated.  COMPARISON:  Radiographs dated 07/01/2015  FINDINGS: The fracture dislocation has been reduced. There is near anatomic alignment and position of the try malleolar fracture fragments. Ankle mortise has been restored.  The posterior malleolar  fragment involves only yet 9 mm of the posterior articular surface. There is 1.5 mm of the articular surface step-off at the posterior malleolar fragment.  IMPRESSION: Near anatomic alignment of the try malleolar fracture. Slight step-off of the articular surface at the posterior malleolar fracture. Tiny bone fragments between the distal tibia and fibula at the level of the dome of the talus.   Electronically Signed   By: Francene Boyers M.D.   On: 07/01/2015 12:28   Portable Chest X-ray (1 View)  07/01/2015   CLINICAL DATA:  Preop for left ankle fracture. History of diabetes, CABG.  EXAM: PORTABLE CHEST - 1 VIEW  COMPARISON:  01/19/2015  FINDINGS: Status post median sternotomy. Heart size is normal. Lungs are clear. No pulmonary edema. There is gaseous distension of the stomach.  IMPRESSION: No active disease.   Electronically Signed   By: Norva Pavlov M.D.   On: 07/01/2015 13:25   Dg C-arm 1-60 Min  07/04/2015   CLINICAL DATA:  ORIF left ankle.  EXAM: DG C-ARM 61-120 MIN; LEFT ANKLE COMPLETE - 3+ VIEW  COMPARISON:  07/01/2015  FLUOROSCOPY TIME:  0 minutes 25 seconds.  Four images.  FINDINGS: Examination demonstrates interval reduction and fixation of patient's fracture dislocation at the ankle. There is a lateral fixation plate with screws bridging patient's distal fibular fracture with hardware intact and anatomic alignment over the fracture site. There are 2 orthopedic screws  extending from the inferior to superior bridging patient's medial malleolar fracture intact with anatomic alignment over the fracture site. There is anatomic alignment about the ankle mortise. Evidence of patient's posterior malleolar fracture without significant displacement. Remainder of the exam is unchanged.  IMPRESSION: Fixation and reduction of patient's ankle fracture dislocation with hardware bridging patient's distal fibular fracture and medial malleolar fracture intact. Normal alignment about the fracture sites and ankle mortise.   Electronically Signed   By: Elberta Fortis M.D.   On: 07/04/2015 17:08    Microbiology: Recent Results (from the past 240 hour(s))  MRSA PCR Screening     Status: Abnormal   Collection Time: 07/01/15  2:48 PM  Result Value Ref Range Status   MRSA by PCR POSITIVE (A) NEGATIVE Final    Comment:        The GeneXpert MRSA Assay (FDA approved for NASAL specimens only), is one component of a comprehensive MRSA colonization surveillance program. It is not intended to diagnose MRSA infection nor to guide or monitor treatment for MRSA infections. RESULT CALLED TO, READ BACK BY AND VERIFIED WITH: MLawernce Ion RN 17:00 07/01/15 ([email or web address])      Labs: Basic Metabolic Panel:  Recent Labs Lab 07/02/15 1642 07/02/15 2028 07/04/15 0242 07/04/15 1502 07/04/15 1620 07/06/15 0614 07/08/15 0450  NA 134* 136 139 140 140 134* 137  K 3.9 3.8 3.7 3.5 3.6 3.7 3.5  CL 103 103 107  --   --  100* 103  CO2 23 25 24   --   --  26 24  GLUCOSE 160* 106* 156* 59* 107* 135* 72  BUN 12 9 6   --   --  8 10  CREATININE 1.02* 0.97 0.67  --   --  0.76 0.79  CALCIUM 8.5* 8.4* 8.5*  --   --  8.5* 8.8*   Liver Function Tests: No results for input(s): AST, ALT, ALKPHOS, BILITOT, PROT, ALBUMIN in the last 168 hours. No results for input(s): LIPASE, AMYLASE in the last 168 hours. No results for input(s): AMMONIA in the last 168  hours. CBC:  Recent Labs Lab 07/05/15 0648  07/06/15 0614 07/07/15 0710 07/08/15 0450 07/09/15 0358  WBC 6.4 7.5 7.9 8.2 5.9  HGB 8.9* 9.7* 9.9* 9.4* 9.6*  HCT 28.3* 30.8* 31.4* 30.0* 30.2*  MCV 77.3* 77.8* 79.1 78.3 78.6  PLT 218 204 255 270 280   Cardiac Enzymes: No results for input(s): CKTOTAL, CKMB, CKMBINDEX, TROPONINI in the last 168 hours. BNP: BNP (last 3 results) No results for input(s): BNP in the last 8760 hours.  ProBNP (last 3 results)  Recent Labs  09/30/14 1431  PROBNP 794.3*    CBG:  Recent Labs Lab 07/08/15 2136 07/08/15 2149 07/08/15 2210 07/09/15 0048 07/09/15 0601  GLUCAP 59* 77 156* 248* 335*       Signed:  Sherl Yzaguirre  [facility] 07/09/2015, 11:29 AM

## 2015-07-10 DIAGNOSIS — E1122 Type 2 diabetes mellitus with diabetic chronic kidney disease: Secondary | ICD-10-CM | POA: Diagnosis not present

## 2015-07-10 DIAGNOSIS — D6489 Other specified anemias: Secondary | ICD-10-CM | POA: Diagnosis not present

## 2015-07-10 DIAGNOSIS — R1310 Dysphagia, unspecified: Secondary | ICD-10-CM | POA: Diagnosis not present

## 2015-07-10 DIAGNOSIS — I25119 Atherosclerotic heart disease of native coronary artery with unspecified angina pectoris: Secondary | ICD-10-CM | POA: Diagnosis not present

## 2015-07-10 DIAGNOSIS — S82851D Displaced trimalleolar fracture of right lower leg, subsequent encounter for closed fracture with routine healing: Secondary | ICD-10-CM | POA: Diagnosis not present

## 2015-07-10 DIAGNOSIS — N183 Chronic kidney disease, stage 3 (moderate): Secondary | ICD-10-CM | POA: Diagnosis not present

## 2015-07-10 DIAGNOSIS — F3340 Major depressive disorder, recurrent, in remission, unspecified: Secondary | ICD-10-CM | POA: Diagnosis not present

## 2015-07-10 DIAGNOSIS — I10 Essential (primary) hypertension: Secondary | ICD-10-CM | POA: Diagnosis not present

## 2015-07-10 DIAGNOSIS — I5030 Unspecified diastolic (congestive) heart failure: Secondary | ICD-10-CM | POA: Diagnosis not present

## 2015-07-11 ENCOUNTER — Ambulatory Visit (HOSPITAL_COMMUNITY): Payer: Medicaid Other

## 2015-07-13 ENCOUNTER — Ambulatory Visit (HOSPITAL_COMMUNITY): Payer: Medicaid Other

## 2015-07-15 ENCOUNTER — Ambulatory Visit (HOSPITAL_COMMUNITY): Payer: Medicaid Other

## 2015-07-18 ENCOUNTER — Ambulatory Visit (HOSPITAL_COMMUNITY): Payer: Medicaid Other

## 2015-07-18 ENCOUNTER — Telehealth: Payer: Self-pay | Admitting: Adult Health

## 2015-07-18 NOTE — Telephone Encounter (Signed)
Noted  

## 2015-07-18 NOTE — Telephone Encounter (Signed)
FYI pt broke her foot and now at rehab center.

## 2015-07-19 ENCOUNTER — Ambulatory Visit: Payer: Medicaid Other | Admitting: Endocrinology

## 2015-07-20 ENCOUNTER — Ambulatory Visit (HOSPITAL_COMMUNITY): Payer: Medicaid Other

## 2015-07-22 ENCOUNTER — Ambulatory Visit (HOSPITAL_COMMUNITY): Payer: Medicaid Other

## 2015-07-22 DIAGNOSIS — S82852D Displaced trimalleolar fracture of left lower leg, subsequent encounter for closed fracture with routine healing: Secondary | ICD-10-CM | POA: Diagnosis not present

## 2015-07-27 ENCOUNTER — Ambulatory Visit (HOSPITAL_COMMUNITY): Payer: Medicaid Other

## 2015-07-29 ENCOUNTER — Ambulatory Visit (HOSPITAL_COMMUNITY): Payer: Medicaid Other

## 2015-07-30 ENCOUNTER — Inpatient Hospital Stay (HOSPITAL_COMMUNITY): Payer: Commercial Managed Care - HMO

## 2015-07-30 ENCOUNTER — Emergency Department (HOSPITAL_COMMUNITY): Payer: Commercial Managed Care - HMO

## 2015-07-30 ENCOUNTER — Inpatient Hospital Stay (HOSPITAL_COMMUNITY)
Admission: EM | Admit: 2015-07-30 | Discharge: 2015-08-09 | DRG: 870 | Disposition: A | Discharge status: Home Health | Payer: Commercial Managed Care - HMO | Attending: Internal Medicine | Admitting: Internal Medicine

## 2015-07-30 ENCOUNTER — Encounter (HOSPITAL_COMMUNITY): Payer: Self-pay | Admitting: Cardiology

## 2015-07-30 DIAGNOSIS — E785 Hyperlipidemia, unspecified: Secondary | ICD-10-CM

## 2015-07-30 DIAGNOSIS — R40242 Glasgow coma scale score 9-12: Secondary | ICD-10-CM | POA: Diagnosis not present

## 2015-07-30 DIAGNOSIS — E1310 Other specified diabetes mellitus with ketoacidosis without coma: Secondary | ICD-10-CM | POA: Diagnosis not present

## 2015-07-30 DIAGNOSIS — N179 Acute kidney failure, unspecified: Secondary | ICD-10-CM | POA: Diagnosis not present

## 2015-07-30 DIAGNOSIS — I350 Nonrheumatic aortic (valve) stenosis: Secondary | ICD-10-CM | POA: Diagnosis present

## 2015-07-30 DIAGNOSIS — R0602 Shortness of breath: Secondary | ICD-10-CM | POA: Diagnosis not present

## 2015-07-30 DIAGNOSIS — J9690 Respiratory failure, unspecified, unspecified whether with hypoxia or hypercapnia: Secondary | ICD-10-CM

## 2015-07-30 DIAGNOSIS — I2510 Atherosclerotic heart disease of native coronary artery without angina pectoris: Secondary | ICD-10-CM | POA: Diagnosis present

## 2015-07-30 DIAGNOSIS — M069 Rheumatoid arthritis, unspecified: Secondary | ICD-10-CM | POA: Diagnosis not present

## 2015-07-30 DIAGNOSIS — I10 Essential (primary) hypertension: Secondary | ICD-10-CM | POA: Diagnosis not present

## 2015-07-30 DIAGNOSIS — I509 Heart failure, unspecified: Secondary | ICD-10-CM | POA: Diagnosis present

## 2015-07-30 DIAGNOSIS — F418 Other specified anxiety disorders: Secondary | ICD-10-CM | POA: Diagnosis present

## 2015-07-30 DIAGNOSIS — Z7982 Long term (current) use of aspirin: Secondary | ICD-10-CM | POA: Diagnosis not present

## 2015-07-30 DIAGNOSIS — I5042 Chronic combined systolic (congestive) and diastolic (congestive) heart failure: Secondary | ICD-10-CM | POA: Diagnosis not present

## 2015-07-30 DIAGNOSIS — K219 Gastro-esophageal reflux disease without esophagitis: Secondary | ICD-10-CM | POA: Diagnosis present

## 2015-07-30 DIAGNOSIS — I252 Old myocardial infarction: Secondary | ICD-10-CM

## 2015-07-30 DIAGNOSIS — Z7901 Long term (current) use of anticoagulants: Secondary | ICD-10-CM | POA: Diagnosis not present

## 2015-07-30 DIAGNOSIS — Z794 Long term (current) use of insulin: Secondary | ICD-10-CM | POA: Diagnosis not present

## 2015-07-30 DIAGNOSIS — G9340 Encephalopathy, unspecified: Secondary | ICD-10-CM | POA: Diagnosis present

## 2015-07-30 DIAGNOSIS — Z952 Presence of prosthetic heart valve: Secondary | ICD-10-CM | POA: Diagnosis not present

## 2015-07-30 DIAGNOSIS — Z79899 Other long term (current) drug therapy: Secondary | ICD-10-CM

## 2015-07-30 DIAGNOSIS — Z791 Long term (current) use of non-steroidal anti-inflammatories (NSAID): Secondary | ICD-10-CM | POA: Diagnosis not present

## 2015-07-30 DIAGNOSIS — E669 Obesity, unspecified: Secondary | ICD-10-CM | POA: Diagnosis present

## 2015-07-30 DIAGNOSIS — L8990 Pressure ulcer of unspecified site, unspecified stage: Secondary | ICD-10-CM | POA: Diagnosis not present

## 2015-07-30 DIAGNOSIS — E1010 Type 1 diabetes mellitus with ketoacidosis without coma: Secondary | ICD-10-CM

## 2015-07-30 DIAGNOSIS — R401 Stupor: Secondary | ICD-10-CM | POA: Diagnosis not present

## 2015-07-30 DIAGNOSIS — R32 Unspecified urinary incontinence: Secondary | ICD-10-CM | POA: Diagnosis present

## 2015-07-30 DIAGNOSIS — J189 Pneumonia, unspecified organism: Secondary | ICD-10-CM | POA: Diagnosis not present

## 2015-07-30 DIAGNOSIS — L89819 Pressure ulcer of head, unspecified stage: Secondary | ICD-10-CM | POA: Diagnosis not present

## 2015-07-30 DIAGNOSIS — Z01818 Encounter for other preprocedural examination: Secondary | ICD-10-CM

## 2015-07-30 DIAGNOSIS — D649 Anemia, unspecified: Secondary | ICD-10-CM | POA: Diagnosis present

## 2015-07-30 DIAGNOSIS — Z955 Presence of coronary angioplasty implant and graft: Secondary | ICD-10-CM

## 2015-07-30 DIAGNOSIS — E1311 Other specified diabetes mellitus with ketoacidosis with coma: Secondary | ICD-10-CM | POA: Diagnosis not present

## 2015-07-30 DIAGNOSIS — S01402A Unspecified open wound of left cheek and temporomandibular area, initial encounter: Secondary | ICD-10-CM | POA: Diagnosis not present

## 2015-07-30 DIAGNOSIS — I214 Non-ST elevation (NSTEMI) myocardial infarction: Secondary | ICD-10-CM

## 2015-07-30 DIAGNOSIS — J9600 Acute respiratory failure, unspecified whether with hypoxia or hypercapnia: Secondary | ICD-10-CM | POA: Diagnosis not present

## 2015-07-30 DIAGNOSIS — E860 Dehydration: Secondary | ICD-10-CM | POA: Diagnosis present

## 2015-07-30 DIAGNOSIS — Z951 Presence of aortocoronary bypass graft: Secondary | ICD-10-CM

## 2015-07-30 DIAGNOSIS — Z8673 Personal history of transient ischemic attack (TIA), and cerebral infarction without residual deficits: Secondary | ICD-10-CM

## 2015-07-30 DIAGNOSIS — F329 Major depressive disorder, single episode, unspecified: Secondary | ICD-10-CM | POA: Diagnosis present

## 2015-07-30 DIAGNOSIS — E11649 Type 2 diabetes mellitus with hypoglycemia without coma: Secondary | ICD-10-CM | POA: Diagnosis not present

## 2015-07-30 DIAGNOSIS — E139 Other specified diabetes mellitus without complications: Secondary | ICD-10-CM | POA: Diagnosis not present

## 2015-07-30 DIAGNOSIS — E875 Hyperkalemia: Secondary | ICD-10-CM | POA: Diagnosis present

## 2015-07-30 DIAGNOSIS — R269 Unspecified abnormalities of gait and mobility: Secondary | ICD-10-CM | POA: Diagnosis not present

## 2015-07-30 DIAGNOSIS — Z4682 Encounter for fitting and adjustment of non-vascular catheter: Secondary | ICD-10-CM | POA: Diagnosis not present

## 2015-07-30 DIAGNOSIS — S0180XA Unspecified open wound of other part of head, initial encounter: Secondary | ICD-10-CM

## 2015-07-30 DIAGNOSIS — S82855D Nondisplaced trimalleolar fracture of left lower leg, subsequent encounter for closed fracture with routine healing: Secondary | ICD-10-CM | POA: Diagnosis not present

## 2015-07-30 DIAGNOSIS — F419 Anxiety disorder, unspecified: Secondary | ICD-10-CM | POA: Diagnosis present

## 2015-07-30 DIAGNOSIS — R55 Syncope and collapse: Secondary | ICD-10-CM | POA: Diagnosis not present

## 2015-07-30 DIAGNOSIS — R4182 Altered mental status, unspecified: Secondary | ICD-10-CM | POA: Diagnosis present

## 2015-07-30 DIAGNOSIS — J9601 Acute respiratory failure with hypoxia: Secondary | ICD-10-CM | POA: Diagnosis not present

## 2015-07-30 DIAGNOSIS — E876 Hypokalemia: Secondary | ICD-10-CM | POA: Diagnosis not present

## 2015-07-30 DIAGNOSIS — S01401A Unspecified open wound of right cheek and temporomandibular area, initial encounter: Secondary | ICD-10-CM | POA: Diagnosis not present

## 2015-07-30 DIAGNOSIS — A419 Sepsis, unspecified organism: Secondary | ICD-10-CM | POA: Diagnosis not present

## 2015-07-30 DIAGNOSIS — R6521 Severe sepsis with septic shock: Secondary | ICD-10-CM | POA: Diagnosis not present

## 2015-07-30 DIAGNOSIS — I6330 Cerebral infarction due to thrombosis of unspecified cerebral artery: Secondary | ICD-10-CM | POA: Diagnosis not present

## 2015-07-30 DIAGNOSIS — R509 Fever, unspecified: Secondary | ICD-10-CM | POA: Diagnosis not present

## 2015-07-30 DIAGNOSIS — R791 Abnormal coagulation profile: Secondary | ICD-10-CM | POA: Diagnosis not present

## 2015-07-30 DIAGNOSIS — E1110 Type 2 diabetes mellitus with ketoacidosis without coma: Secondary | ICD-10-CM | POA: Diagnosis present

## 2015-07-30 LAB — MRSA PCR SCREENING: MRSA BY PCR: NEGATIVE

## 2015-07-30 LAB — COMPREHENSIVE METABOLIC PANEL
ALBUMIN: 3.2 g/dL — AB (ref 3.5–5.0)
ALT: 21 U/L (ref 14–54)
AST: 41 U/L (ref 15–41)
Alkaline Phosphatase: 124 U/L (ref 38–126)
BUN: 33 mg/dL — AB (ref 6–20)
CHLORIDE: 92 mmol/L — AB (ref 101–111)
CREATININE: 2.31 mg/dL — AB (ref 0.44–1.00)
Calcium: 9 mg/dL (ref 8.9–10.3)
GFR calc Af Amer: 26 mL/min — ABNORMAL LOW (ref >60)
GFR calc non Af Amer: 23 mL/min — ABNORMAL LOW (ref >60)
GLUCOSE: 1284 mg/dL — AB (ref 65–99)
Potassium: >7.5 mmol/L (ref 3.5–5.1)
SODIUM: 127 mmol/L — AB (ref 135–145)
Total Bilirubin: 1.6 mg/dL — ABNORMAL HIGH (ref 0.3–1.2)
Total Protein: 6.2 g/dL — ABNORMAL LOW (ref 6.5–8.1)

## 2015-07-30 LAB — I-STAT ARTERIAL BLOOD GAS, ED
ACID-BASE DEFICIT: 27 mmol/L — AB (ref 0.0–2.0)
Bicarbonate: 6.4 mEq/L — ABNORMAL LOW (ref 20.0–24.0)
O2 Saturation: 100 %
PCO2 ART: 36 mmHg (ref 35.0–45.0)
PH ART: 6.857 — AB (ref 7.350–7.450)
PO2 ART: 504 mmHg — AB (ref 80.0–100.0)
TCO2: 7 mmol/L (ref 0–100)

## 2015-07-30 LAB — PROCALCITONIN: PROCALCITONIN: 7.08 ng/mL

## 2015-07-30 LAB — BASIC METABOLIC PANEL
ANION GAP: 22 — AB (ref 5–15)
ANION GAP: 18 — AB (ref 5–15)
ANION GAP: 14 (ref 5–15)
BUN: 28 mg/dL — ABNORMAL HIGH (ref 6–20)
BUN: 26 mg/dL — ABNORMAL HIGH (ref 6–20)
BUN: 23 mg/dL — ABNORMAL HIGH (ref 6–20)
CALCIUM: 7.5 mg/dL — AB (ref 8.9–10.3)
CALCIUM: 8 mg/dL — AB (ref 8.9–10.3)
CALCIUM: 8.3 mg/dL — AB (ref 8.9–10.3)
CO2: 5 mmol/L — ABNORMAL LOW (ref 22–32)
CO2: 7 mmol/L — ABNORMAL LOW (ref 22–32)
CO2: 12 mmol/L — ABNORMAL LOW (ref 22–32)
Chloride: 109 mmol/L (ref 101–111)
Chloride: 114 mmol/L — ABNORMAL HIGH (ref 101–111)
Chloride: 118 mmol/L — ABNORMAL HIGH (ref 101–111)
Creatinine, Ser: 2.02 mg/dL — ABNORMAL HIGH (ref 0.44–1.00)
Creatinine, Ser: 1.96 mg/dL — ABNORMAL HIGH (ref 0.44–1.00)
Creatinine, Ser: 1.58 mg/dL — ABNORMAL HIGH (ref 0.44–1.00)
GFR, EST AFRICAN AMERICAN: 31 mL/min — AB (ref >60)
GFR, EST AFRICAN AMERICAN: 32 mL/min — AB (ref >60)
GFR, EST AFRICAN AMERICAN: 42 mL/min — AB (ref >60)
GFR, EST NON AFRICAN AMERICAN: 27 mL/min — AB (ref >60)
GFR, EST NON AFRICAN AMERICAN: 28 mL/min — AB (ref >60)
GFR, EST NON AFRICAN AMERICAN: 36 mL/min — AB (ref >60)
GLUCOSE: 904 mg/dL — AB (ref 65–99)
GLUCOSE: 655 mg/dL — AB (ref 65–99)
GLUCOSE: 267 mg/dL — AB (ref 65–99)
POTASSIUM: 5.5 mmol/L — AB (ref 3.5–5.1)
POTASSIUM: 4.1 mmol/L (ref 3.5–5.1)
POTASSIUM: 3.2 mmol/L — AB (ref 3.5–5.1)
SODIUM: 139 mmol/L (ref 135–145)
SODIUM: 144 mmol/L (ref 135–145)
Sodium: 136 mmol/L (ref 135–145)

## 2015-07-30 LAB — CBC WITH DIFFERENTIAL/PLATELET
BASOS PCT: 0 % (ref 0–1)
Basophils Absolute: 0.1 10*3/uL (ref 0.0–0.1)
Eosinophils Absolute: 0.1 10*3/uL (ref 0.0–0.7)
Eosinophils Relative: 0 % (ref 0–5)
HEMATOCRIT: 34.5 % — AB (ref 36.0–46.0)
Hemoglobin: 9.6 g/dL — ABNORMAL LOW (ref 12.0–15.0)
LYMPHS PCT: 10 % — AB (ref 12–46)
Lymphs Abs: 4.2 10*3/uL — ABNORMAL HIGH (ref 0.7–4.0)
MCH: 26.3 pg (ref 26.0–34.0)
MCHC: 27.8 g/dL — ABNORMAL LOW (ref 30.0–36.0)
MCV: 94.5 fL (ref 78.0–100.0)
MONO ABS: 1.4 10*3/uL — AB (ref 0.1–1.0)
MONOS PCT: 3 % (ref 3–12)
Neutro Abs: 37.2 10*3/uL — ABNORMAL HIGH (ref 1.7–7.7)
Neutrophils Relative %: 87 % — ABNORMAL HIGH (ref 43–77)
PLATELETS: 425 10*3/uL — AB (ref 150–400)
RBC: 3.65 MIL/uL — AB (ref 3.87–5.11)
RDW: 21.9 % — AB (ref 11.5–15.5)
WBC: 43 10*3/uL — AB (ref 4.0–10.5)

## 2015-07-30 LAB — URINALYSIS, ROUTINE W REFLEX MICROSCOPIC
BILIRUBIN URINE: NEGATIVE
Bilirubin Urine: NEGATIVE
Glucose, UA: >1000 mg/dL — AB
Ketones, ur: >80 mg/dL — AB
LEUKOCYTES UA: NEGATIVE
Leukocytes, UA: NEGATIVE
NITRITE: NEGATIVE
Nitrite: NEGATIVE
PH: 5 (ref 5.0–8.0)
PROTEIN: NEGATIVE mg/dL
Protein, ur: NEGATIVE mg/dL
SPECIFIC GRAVITY, URINE: 1.019 (ref 1.005–1.030)
Specific Gravity, Urine: 1.023 (ref 1.005–1.030)
UROBILINOGEN UA: 0.2 mg/dL (ref 0.0–1.0)
Urobilinogen, UA: 0.2 mg/dL (ref 0.0–1.0)
pH: 5 (ref 5.0–8.0)

## 2015-07-30 LAB — HEPATIC FUNCTION PANEL
ALT: 27 U/L (ref 14–54)
AST: 37 U/L (ref 15–41)
Albumin: 2.7 g/dL — ABNORMAL LOW (ref 3.5–5.0)
Alkaline Phosphatase: 116 U/L (ref 38–126)
TOTAL PROTEIN: 5.6 g/dL — AB (ref 6.5–8.1)
Total Bilirubin: 1.4 mg/dL — ABNORMAL HIGH (ref 0.3–1.2)

## 2015-07-30 LAB — CARBOXYHEMOGLOBIN
Carboxyhemoglobin: 1.4 % (ref 0.5–1.5)
METHEMOGLOBIN: 1.2 % (ref 0.0–1.5)
O2 Saturation: 81.5 %
TOTAL HEMOGLOBIN: 9 g/dL — AB (ref 12.0–16.0)

## 2015-07-30 LAB — GLUCOSE, CAPILLARY
Glucose-Capillary: >600 mg/dL (ref 65–99)
Glucose-Capillary: >600 mg/dL (ref 65–99)
Glucose-Capillary: >600 mg/dL (ref 65–99)
Glucose-Capillary: >600 mg/dL (ref 65–99)

## 2015-07-30 LAB — MAGNESIUM: Magnesium: 3.2 mg/dL — ABNORMAL HIGH (ref 1.7–2.4)

## 2015-07-30 LAB — I-STAT CHEM 8, ED
BUN: 38 mg/dL — ABNORMAL HIGH (ref 6–20)
CALCIUM ION: 1.2 mmol/L (ref 1.12–1.23)
Chloride: 100 mmol/L — ABNORMAL LOW (ref 101–111)
Creatinine, Ser: 1.2 mg/dL — ABNORMAL HIGH (ref 0.44–1.00)
Glucose, Bld: >700 mg/dL (ref 65–99)
HEMATOCRIT: 36 % (ref 36.0–46.0)
HEMOGLOBIN: 12.2 g/dL (ref 12.0–15.0)
Potassium: 6.6 mmol/L (ref 3.5–5.1)
SODIUM: 127 mmol/L — AB (ref 135–145)
TCO2: 5 mmol/L (ref 0–100)

## 2015-07-30 LAB — I-STAT VENOUS BLOOD GAS, ED
Acid-base deficit: 28 mmol/L — ABNORMAL HIGH (ref 0.0–2.0)
BICARBONATE: 3.9 meq/L — AB (ref 20.0–24.0)
O2 SAT: 98 %
PCO2 VEN: 20.1 mmHg — AB (ref 45.0–50.0)
PH VEN: 6.892 — AB (ref 7.250–7.300)
pO2, Ven: 179 mmHg — ABNORMAL HIGH (ref 30.0–45.0)

## 2015-07-30 LAB — URINE MICROSCOPIC-ADD ON

## 2015-07-30 LAB — I-STAT CG4 LACTIC ACID, ED: LACTIC ACID, VENOUS: 8.65 mmol/L — AB (ref 0.5–2.0)

## 2015-07-30 LAB — BRAIN NATRIURETIC PEPTIDE: B NATRIURETIC PEPTIDE 5: 398.4 pg/mL — AB (ref 0.0–100.0)

## 2015-07-30 LAB — SALICYLATE LEVEL: Salicylate Lvl: <4 mg/dL (ref 2.8–30.0)

## 2015-07-30 LAB — ACETAMINOPHEN LEVEL: Acetaminophen (Tylenol), Serum: <10 ug/mL — ABNORMAL LOW (ref 10–30)

## 2015-07-30 LAB — CORTISOL: CORTISOL PLASMA: 21 ug/dL

## 2015-07-30 LAB — TROPONIN I
Troponin I: <0.03 ng/mL (ref <0.031)
Troponin I: <0.03 ng/mL (ref <0.031)

## 2015-07-30 LAB — ETHANOL: Alcohol, Ethyl (B): <5 mg/dL (ref <5)

## 2015-07-30 LAB — LACTIC ACID, PLASMA: LACTIC ACID, VENOUS: 4.4 mmol/L — AB (ref 0.5–2.0)

## 2015-07-30 LAB — I-STAT TROPONIN, ED: Troponin i, poc: 0 ng/mL (ref 0.00–0.08)

## 2015-07-30 LAB — TSH: TSH: 0.532 u[IU]/mL (ref 0.350–4.500)

## 2015-07-30 LAB — KETONES, URINE
Ketones, ur: >80 mg/dL — AB
Ketones, ur: >80 mg/dL — AB

## 2015-07-30 LAB — PROTIME-INR
INR: 3.5 — ABNORMAL HIGH (ref 0.00–1.49)
Prothrombin Time: 34.4 seconds — ABNORMAL HIGH (ref 11.6–15.2)

## 2015-07-30 LAB — CK: CK TOTAL: 31 U/L — AB (ref 38–234)

## 2015-07-30 MED ORDER — HYDROCORTISONE NA SUCCINATE PF 100 MG IJ SOLR
50.0000 mg | Freq: Four times a day (QID) | INTRAMUSCULAR | Status: DC
  Administered 2015-07-30: 50 mg via INTRAVENOUS
  Filled 2015-07-30: qty 2

## 2015-07-30 MED ORDER — PIPERACILLIN-TAZOBACTAM 3.375 G IVPB
3.3750 g | Freq: Three times a day (TID) | INTRAVENOUS | Status: AC
  Administered 2015-07-30 – 2015-08-05 (×19): 3.375 g via INTRAVENOUS
  Filled 2015-07-30 (×19): qty 50

## 2015-07-30 MED ORDER — VANCOMYCIN HCL IN DEXTROSE 1-5 GM/200ML-% IV SOLN: 1000.0000 mg | Freq: Once | INTRAVENOUS | Status: DC

## 2015-07-30 MED ORDER — STERILE WATER FOR INJECTION IV SOLN
INTRAVENOUS | Status: DC
  Administered 2015-07-30: 18:00:00 via INTRAVENOUS
  Filled 2015-07-30 (×4): qty 850

## 2015-07-30 MED ORDER — FENTANYL CITRATE (PF) 100 MCG/2ML IJ SOLN
100.0000 ug | INTRAMUSCULAR | Status: DC | PRN
  Administered 2015-07-30 – 2015-07-31 (×2): 100 ug via INTRAVENOUS
  Filled 2015-07-30 (×3): qty 2

## 2015-07-30 MED ORDER — ROCURONIUM BROMIDE 50 MG/5ML IV SOLN
INTRAVENOUS | Status: AC
  Filled 2015-07-30: qty 2

## 2015-07-30 MED ORDER — NOREPINEPHRINE BITARTRATE 1 MG/ML IV SOLN
2.0000 ug/min | INTRAVENOUS | Status: DC
  Administered 2015-07-30: 15 ug/min via INTRAVENOUS
  Filled 2015-07-30 (×2): qty 16

## 2015-07-30 MED ORDER — VANCOMYCIN HCL IN DEXTROSE 750-5 MG/150ML-% IV SOLN
750.0000 mg | Freq: Two times a day (BID) | INTRAVENOUS | Status: DC
  Administered 2015-07-31 – 2015-08-02 (×6): 750 mg via INTRAVENOUS
  Filled 2015-07-30 (×7): qty 150

## 2015-07-30 MED ORDER — SODIUM CHLORIDE 0.9 % IV BOLUS (SEPSIS)
1000.0000 mL | INTRAVENOUS | Status: AC
  Administered 2015-07-30 (×2): 1000 mL via INTRAVENOUS

## 2015-07-30 MED ORDER — SODIUM CHLORIDE 0.9 % IV BOLUS (SEPSIS)
1000.0000 mL | Freq: Once | INTRAVENOUS | Status: AC
  Administered 2015-07-30: 1000 mL via INTRAVENOUS

## 2015-07-30 MED ORDER — DEXTROSE-NACL 5-0.45 % IV SOLN
INTRAVENOUS | Status: DC
  Administered 2015-07-30: 23:00:00 via INTRAVENOUS

## 2015-07-30 MED ORDER — VASOPRESSIN 20 UNIT/ML IV SOLN
0.0300 [IU]/min | INTRAVENOUS | Status: DC
  Administered 2015-07-30: 0.03 [IU]/min via INTRAVENOUS
  Filled 2015-07-30 (×2): qty 2

## 2015-07-30 MED ORDER — NOREPINEPHRINE BITARTRATE 1 MG/ML IV SOLN
2.0000 ug/min | INTRAVENOUS | Status: DC
  Administered 2015-07-30: 5 ug/min via INTRAVENOUS
  Filled 2015-07-30: qty 4

## 2015-07-30 MED ORDER — MIDAZOLAM HCL 2 MG/2ML IJ SOLN
2.0000 mg | INTRAMUSCULAR | Status: DC | PRN
  Administered 2015-07-31 – 2015-08-04 (×7): 2 mg via INTRAVENOUS
  Filled 2015-07-30 (×4): qty 2

## 2015-07-30 MED ORDER — MIDAZOLAM HCL 2 MG/2ML IJ SOLN
2.0000 mg | INTRAMUSCULAR | Status: DC | PRN
  Filled 2015-07-30 (×3): qty 2

## 2015-07-30 MED ORDER — SODIUM CHLORIDE 0.9 % IV BOLUS (SEPSIS): 500.0000 mL | INTRAVENOUS | Status: AC

## 2015-07-30 MED ORDER — PANTOPRAZOLE SODIUM 40 MG IV SOLR
40.0000 mg | INTRAVENOUS | Status: DC
  Administered 2015-07-31 – 2015-08-04 (×5): 40 mg via INTRAVENOUS
  Filled 2015-07-30 (×7): qty 40

## 2015-07-30 MED ORDER — SODIUM CHLORIDE 0.9 % IV SOLN
INTRAVENOUS | Status: AC
  Administered 2015-07-30: 12:00:00 via INTRAVENOUS

## 2015-07-30 MED ORDER — LIDOCAINE HCL (CARDIAC) 20 MG/ML IV SOLN
INTRAVENOUS | Status: AC
  Filled 2015-07-30: qty 5

## 2015-07-30 MED ORDER — ANTISEPTIC ORAL RINSE SOLUTION (CORINZ)
7.0000 mL | Freq: Four times a day (QID) | OROMUCOSAL | Status: DC
  Administered 2015-07-31 – 2015-08-04 (×20): 7 mL via OROMUCOSAL

## 2015-07-30 MED ORDER — ETOMIDATE 2 MG/ML IV SOLN
20.0000 mg | Freq: Once | INTRAVENOUS | Status: AC
  Administered 2015-07-30: 20 mg via INTRAVENOUS

## 2015-07-30 MED ORDER — CHLORHEXIDINE GLUCONATE 0.12% ORAL RINSE (MEDLINE KIT)
15.0000 mL | Freq: Two times a day (BID) | OROMUCOSAL | Status: DC
Start: 2015-07-30 — End: 2015-08-04
  Administered 2015-07-30 – 2015-08-04 (×10): 15 mL via OROMUCOSAL

## 2015-07-30 MED ORDER — FENTANYL CITRATE (PF) 100 MCG/2ML IJ SOLN
100.0000 ug | INTRAMUSCULAR | Status: DC | PRN
  Administered 2015-07-30: 50 ug via INTRAVENOUS
  Administered 2015-07-31 – 2015-08-04 (×10): 100 ug via INTRAVENOUS
  Filled 2015-07-30 (×11): qty 2

## 2015-07-30 MED ORDER — VANCOMYCIN HCL 10 G IV SOLR
1500.0000 mg | Freq: Once | INTRAVENOUS | Status: AC
  Administered 2015-07-30: 1500 mg via INTRAVENOUS
  Filled 2015-07-30 (×2): qty 1500

## 2015-07-30 MED ORDER — ACETAMINOPHEN 325 MG PO TABS
650.0000 mg | ORAL_TABLET | Freq: Four times a day (QID) | ORAL | Status: DC | PRN
  Administered 2015-08-02 – 2015-08-08 (×4): 650 mg via ORAL
  Filled 2015-07-30 (×6): qty 2

## 2015-07-30 MED ORDER — SODIUM CHLORIDE 0.9 % IV SOLN
INTRAVENOUS | Status: DC
  Administered 2015-07-30: 5.4 [IU]/h via INTRAVENOUS
  Administered 2015-07-31: 10.6 [IU]/h via INTRAVENOUS
  Filled 2015-07-30: qty 2.5

## 2015-07-30 MED ORDER — PIPERACILLIN-TAZOBACTAM 3.375 G IVPB 30 MIN
3.3750 g | Freq: Once | INTRAVENOUS | Status: AC
  Administered 2015-07-30: 3.375 g via INTRAVENOUS
  Filled 2015-07-30: qty 50

## 2015-07-30 MED ORDER — SODIUM BICARBONATE 8.4 % IV SOLN: 100.0000 meq | Freq: Once | INTRAVENOUS | Status: DC

## 2015-07-30 MED ORDER — ETOMIDATE 2 MG/ML IV SOLN
INTRAVENOUS | Status: AC
  Filled 2015-07-30: qty 20

## 2015-07-30 MED ORDER — SODIUM CHLORIDE 0.9 % IV SOLN
INTRAVENOUS | Status: DC
  Administered 2015-07-30 – 2015-08-04 (×2): via INTRAVENOUS

## 2015-07-30 MED ORDER — ROCURONIUM BROMIDE 50 MG/5ML IV SOLN
70.0000 mg | Freq: Once | INTRAVENOUS | Status: AC
  Administered 2015-07-30: 70 mg via INTRAVENOUS

## 2015-07-30 MED ORDER — SODIUM CHLORIDE 0.9 % IV SOLN
INTRAVENOUS | Status: DC
  Filled 2015-07-30: qty 2.5

## 2015-07-30 MED ORDER — ONDANSETRON HCL 4 MG/2ML IJ SOLN: 4.0000 mg | Freq: Four times a day (QID) | INTRAMUSCULAR | Status: DC | PRN

## 2015-07-30 MED ORDER — SUCCINYLCHOLINE CHLORIDE 20 MG/ML IJ SOLN
INTRAMUSCULAR | Status: AC
  Filled 2015-07-30: qty 1

## 2015-07-30 NOTE — ED Notes (Signed)
Called Pharmacy for Levophed

## 2015-07-30 NOTE — ED Provider Notes (Signed)
Patient found unresponsive on floor next to her bed. On exam Glasgow Coma Score 3 ill-appearing gag reflex absent HEENT exam normocephalic atraumatic eyes deviated to the left pupils 3 mm equal and reactive to light bilaterally neck no step-off heart tachycardic regular rhythm abdomen nondistended nontender left lower extremity with sutured surgical wound, clean appearing over ankle. No swelling. All 4 extremities or contusion abrasion or deformity. Radial pulses 2+ bilaterally DP and femoral pulses 2+ bilaterally. Patient was intubated by me at 9:25 AM. Procedure timeout called she was premedicated with etomidate 20 mg IV and right running 79 g IV orally intubated with a 7.5 ET tube with good visualization of the cords and symmetric bilateral breath sounds bilaterally post intubation with good condensation in the tube. Good color change on EZAP. ET tube taped at 23 cm at gingiva  Critical care physician consulted and arrived to take over care of patient 10:54 AM chest x-ray reviewed by me ET tube and NG tube with good placement Patient noted to be septic, hypothermic, DKA, hypotensive and hyperkalemic  Doug Sou, MD 07/30/15 1210

## 2015-07-30 NOTE — ED Notes (Signed)
Pt to department via EMS-pt was found by home health RN on the floor beside her bed. Pt with vomit on the floor and incontinent of urine. Pt with boot to the left leg, recently dc'ed from [facility]. Bp-90/28 Hr-114 Cbg- read high. Pt is responsive to pain.

## 2015-07-30 NOTE — Progress Notes (Signed)
ANTIBIOTIC CONSULT NOTE - INITIAL  Pharmacy Consult for Vancomycin and Zosyn Indication: Sepsis  Allergies  Allergen Reactions  . Celebrex [Celecoxib] Rash  . Detrol [Tolterodine] Hives    Patient Measurements: TBW 74 kg in August 2016  Vital Signs: Temp: 94.5 F (34.7 C) (09/10 0937) Temp Source: Rectal (09/10 0937) BP: 97/33 mmHg (09/10 0930) Pulse Rate: 135 (09/10 0904) Intake/Output from previous day:   Intake/Output from this shift:    Labs: No results for input(s): WBC, HGB, PLT, LABCREA, CREATININE in the last 72 hours. CrCl cannot be calculated (Unknown ideal weight.). No results for input(s): VANCOTROUGH, VANCOPEAK, VANCORANDOM, GENTTROUGH, GENTPEAK, GENTRANDOM, TOBRATROUGH, TOBRAPEAK, TOBRARND, AMIKACINPEAK, AMIKACINTROU, AMIKACIN in the last 72 hours.   Microbiology: Recent Results (from the past 720 hour(s))  MRSA PCR Screening     Status: Abnormal   Collection Time: 07/01/15  2:48 PM  Result Value Ref Range Status   MRSA by PCR POSITIVE (A) NEGATIVE Final    Comment:        The GeneXpert MRSA Assay (FDA approved for NASAL specimens only), is one component of a comprehensive MRSA colonization surveillance program. It is not intended to diagnose MRSA infection nor to guide or monitor treatment for MRSA infections. RESULT CALLED TO, READ BACK BY AND VERIFIED WITH: Charolotte Eke RN 17:00 07/01/15 ([email or web address])     Medical History: Past Medical History  Diagnosis Date  . CAD (coronary artery disease)   . Obesity   . Hypercholesteremia   . HTN (hypertension)   . Dyslipidemia   . Aortic stenosis   . Thrombophlebitis   . Heart murmur   . Myocardial infarction 12/2014  . Pneumonia 11/2014; 12/2014  . GERD (gastroesophageal reflux disease)   . 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 Anxiety   . Depression   . Allergy   . CHF (congestive heart failure)   . Rheumatoid arthritis(714.0)     "hands" (07/01/2015)  . Diabetes dx'd 1982    "went straight to insulin"    Assessment: 54 yo F presents on 9/10 after being found on the floor beside bed by home health RN. Pt had vomited and was incontinent of urine. Intubated in the ED and code sepsis was called. Pharmacy to dose vancomycin and zosyn. Hypothermic and WBC pending. LA was elevated at 8.65. SCr is 1.2, CrCl ~50-43ml/min  Goal of Therapy:  Vancomycin trough level 15-20 mcg/ml  Resolution of infection  Plan:  Give vancomycin 1.5g IV x 1, then start 750mg  IV Q12 Give Zosyn 3.375g IV (30 min infusion) x 1, then start Zosyn 3.375 gm IV q8h (4 hour infusion) Monitor clinical picture, renal function, VT at Css F/U C&S, abx deescalation / LOT  Chon Buhl J 07/30/2015,9:44 AM

## 2015-07-30 NOTE — Progress Notes (Signed)
eLink Physician-Brief Progress Note Patient Name: Madison [name] DOB: 06-Mar-1961 MRN: [id number]   Date of Service  07/30/2015  HPI/Events of Note  Increased agitation  eICU Interventions  Add fentanyl prn and versed prn     Intervention Category Major Interventions: Delirium, psychosis, severe agitation - evaluation and management  Shan Levans 07/30/2015, 4:28 PM

## 2015-07-30 NOTE — Progress Notes (Signed)
eLink Physician-Brief Progress Note Patient Name: Madison [name] DOB: Apr 30, 1961 MRN: [id number]   Date of Service  07/30/2015  HPI/Events of Note  Severe shock  eICU Interventions  Give vasopressin     Intervention Category Major Interventions: Shock - evaluation and management  Shan Levans 07/30/2015, 3:56 PM

## 2015-07-30 NOTE — ED Notes (Signed)
EDP at the bedside to intubate.

## 2015-07-30 NOTE — ED Notes (Signed)
PCCM at the bedside 

## 2015-07-30 NOTE — ED Provider Notes (Signed)
CSN: [id number]     Arrival date & time 07/30/15  1478 History   First MD Initiated Contact with Patient 07/30/15 [phone]     Chief Complaint  Patient presents with  . Altered Mental Status     (Consider location/radiation/quality/duration/timing/severity/associated sxs/prior Treatment) HPI   LEVEL V- AMS  GUILIANNA MCKOY 54 y.o.female  PCP: [name], NP  Blood pressure 120/40, pulse 135, resp. rate 23, SpO2 100 %.  SIGNIFICANT PMH: DKA with coma, HTN, diabetes, stroke (left side deficits), CHF, MI  CHIEF COMPLAINT: AMS  Patient found by home health RN on floor next to bedside. Vomit on floor beside her and incontinent of urine. Recently discharged from [facility] after an injury to her left lower leg.  Per EMS CBG monitor read high. Pt tachypneic, dry appearing, only responsive to pain. Eyes deviated to the left.   Past Medical History  Diagnosis Date  . CAD (coronary artery disease)   . Obesity   . Hypercholesteremia   . HTN (hypertension)   . Dyslipidemia   . Aortic stenosis   . Thrombophlebitis   . Heart murmur   . Myocardial infarction 12/2014  . Pneumonia 11/2014; 12/2014  . GERD (gastroesophageal reflux disease)   . 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 Anxiety   . Depression   . Allergy   . CHF (congestive heart failure)   . Rheumatoid arthritis(714.0)     "hands" (07/01/2015)  . Diabetes dx'd 1982    "went straight to insulin"   Past Surgical History  Procedure Laterality Date  . Cesarean section  1995  . Foot fracture surgery    . Knee arthroscopy Right   . Neuroplasty / transposition median nerve at carpal tunnel    . Left heart catheterization with coronary angiogram N/A 12/08/2014    Procedure: LEFT HEART CATHETERIZATION WITH CORONARY ANGIOGRAM;  Surgeon: Iran Ouch, MD;  Location: [facility] CATH LAB;  Service: Cardiovascular;  Laterality: N/A;  . Percutaneous coronary stent  intervention (pci-s) N/A 12/09/2014    Procedure: PERCUTANEOUS CORONARY STENT INTERVENTION (PCI-S);  Surgeon: [name], MD;  Location: [facility] CATH LAB;  Service: Cardiovascular;  Laterality: N/A;  . Cardiac catheterization  12/08/2014  . Coronary angioplasty with stent placement  12/09/2014  . Aortic valve replacement (avr)/coronary artery bypass grafting (cabg)  "2014"    [name] 03/08/2014  . Fracture surgery    . Tubal ligation  1995  . Cataract extraction Left   . Coronary artery bypass graft    . Orif ankle fracture Left 07/04/2015    Procedure: OPEN REDUCTION INTERNAL FIXATION (ORIF)  TRIMAL ANKLE FRACTURE;  Surgeon: [name], MD;  Location: [facility] OR;  Service: Orthopedics;  Laterality: Left;   Family History  Problem Relation Age of Onset  . Stroke    . Heart disease Mother   . Heart disease Sister    Social History  Substance Use Topics  . Smoking status: Never Smoker   . Smokeless tobacco: Never Used  . Alcohol Use: No   OB History    No data available     Review of Systems  LEVEL V- AMS  Allergies  Celebrex and Detrol  Home Medications   Prior to Admission medications   Medication Sig Start Date End Date Taking? Authorizing Provider  aspirin 81 MG tablet Take 1 tablet (81 mg total) by mouth daily. 10/11/14   Dawood S Elgergawy,  MD  baclofen (LIORESAL) 10 MG tablet TAKE 1 TABLET (10 MG TOTAL) BY MOUTH 2 (TWO) TIMES DAILY. 06/29/15   [name], NP  clonazePAM (KLONOPIN) 0.5 MG tablet Take 1 tablet (0.5 mg total) by mouth 2 (two) times daily. 07/08/15   Kathlen Mody, MD  escitalopram (LEXAPRO) 20 MG tablet Take 20 mg by mouth daily.    Historical Provider, MD  glucagon (GLUCAGON EMERGENCY) 1 MG injection Inject 1 mg into the vein once as needed. 02/08/15   Romero Belling, MD  glucose blood (ACCU-CHEK AVIVA) test strip Use to check blood sugar 5 times per day Dx Code E10.10 And lancets 5/day 01/10/15   Romero Belling, MD  HYDROcodone-acetaminophen (NORCO) 7.5-325 MG per  tablet Take 1-2 tablets by mouth every 6 (six) hours as needed for moderate pain. 07/04/15   Naiping Donnelly Stager, MD  insulin aspart (NOVOLOG) 100 UNIT/ML injection Inject 0-15 Units into the skin 3 (three) times daily with meals. 07/08/15   Kathlen Mody, MD  insulin detemir (LEVEMIR) 100 UNIT/ML injection Inject 0.18 mLs (18 Units total) into the skin 2 (two) times daily. 07/08/15   Kathlen Mody, MD  lisinopril (PRINIVIL,ZESTRIL) 2.5 MG tablet TAKE 1 TABLET BY MOUTH EVERY DAY 04/05/15   [name], NP  metoprolol tartrate (LOPRESSOR) 25 MG tablet Take 0.5 tablets (12.5 mg total) by mouth 2 (two) times daily. Patient taking differently: Take 25 mg by mouth daily.  12/15/14   Nishant Dhungel, MD  omeprazole (PRILOSEC) 40 MG capsule TAKE 1 CAPSULE (40 MG TOTAL) BY MOUTH DAILY. 11/05/14   Nelwyn Salisbury, MD  pravastatin (PRAVACHOL) 20 MG tablet TAKE 1 TABLET BY MOUTH EVERY DAY 02/16/15   Eulis Foster, FNP  RHEUMATREX 2.5 MG tablet TAKE 1 TABLET 2 TIMES A WEEK Patient taking differently: TAKE 1 TABLET 2 TIMES A WEEK (MON AND FRI) 05/24/15   [name], NP  solifenacin (VESICARE) 5 MG tablet Take 1 tablet (5 mg total) by mouth daily. 05/03/15   [name], NP  traZODone (DESYREL) 50 MG tablet Take 0.5-1 tablets (25-50 mg total) by mouth at bedtime as needed for sleep. 07/08/15   Kathlen Mody, MD  warfarin (COUMADIN) 5 MG tablet TAKE 1-1.5 BY MOUTH DAILY AT 6PM. TAKE 1 TAB DAILY TUES,TH,F,SAT & SUN. TAKE 1& 1/2 TAB ON MON &WED Patient taking differently: Take 5 mg daily on Mon, Wed, Fri. 7.5 mg on all other days 05/16/15   Nishant Dhungel, MD  zolpidem (AMBIEN) 10 MG tablet Take 10 mg by mouth at bedtime as needed. for sleep 06/17/15   Historical Provider, MD   BP 75/48 mmHg  Pulse 109  Temp(Src) 94.5 F (34.7 C) (Rectal)  Resp 24  SpO2 100% Physical Exam  Constitutional: She appears distressed. Cervical collar in place.  Dry, responsive only to pain  HENT:  Head: Normocephalic and atraumatic. Head is  without abrasion and without contusion.  Mouth/Throat: Mucous membranes are dry.  Eyes: Pupils are equal, round, and reactive to light.  Eyes deviated towards the left  Neck:  No step off to cervical spine.  Pulmonary/Chest: Tachypnea noted.  Musculoskeletal:  Boot to left leg, removed and no obvious sign of infection.  Neurological: She is unresponsive.  GCS 3  Nursing note and vitals reviewed.   ED Course  Procedures (including critical care time) Labs Review Labs Reviewed  CBC WITH DIFFERENTIAL/PLATELET - Abnormal; Notable for the following:    WBC 43.0 (*)    RBC 3.65 (*)    Hemoglobin  9.6 (*)    HCT 34.5 (*)    MCHC 27.8 (*)    RDW 21.9 (*)    Platelets 425 (*)    Neutro Abs 37.2 (*)    Lymphs Abs 4.2 (*)    Monocytes Absolute 1.4 (*)    Neutrophils Relative % 87 (*)    Lymphocytes Relative 10 (*)    All other components within normal limits  COMPREHENSIVE METABOLIC PANEL - Abnormal; Notable for the following:    Sodium 127 (*)    Potassium >7.5 (*)    Chloride 92 (*)    CO2 <5 (*)    Glucose, Bld 1284 (*)    BUN 33 (*)    Creatinine, Ser 2.31 (*)    Total Protein 6.2 (*)    Albumin 3.2 (*)    Total Bilirubin 1.6 (*)    GFR calc non Af Amer 23 (*)    GFR calc Af Amer 26 (*)    All other components within normal limits  KETONES, URINE - Abnormal; Notable for the following:    Ketones, ur >80 (*)    All other components within normal limits  URINALYSIS, ROUTINE W REFLEX MICROSCOPIC (NOT AT [facility]) - Abnormal; Notable for the following:    APPearance CLOUDY (*)    Glucose, UA >1000 (*)    Hgb urine dipstick LARGE (*)    Ketones, ur >80 (*)    All other components within normal limits  ACETAMINOPHEN LEVEL - Abnormal; Notable for the following:    Acetaminophen (Tylenol), Serum <10 (*)    All other components within normal limits  I-STAT CG4 LACTIC ACID, ED - Abnormal; Notable for the following:    Lactic Acid, Venous 8.65 (*)    All other components within  normal limits  I-STAT CHEM 8, ED - Abnormal; Notable for the following:    Sodium 127 (*)    Potassium 6.6 (*)    Chloride 100 (*)    BUN 38 (*)    Creatinine, Ser 1.20 (*)    Glucose, Bld >700 (*)    All other components within normal limits  I-STAT VENOUS BLOOD GAS, ED - Abnormal; Notable for the following:    pH, Ven 6.892 (*)    pCO2, Ven 20.1 (*)    pO2, Ven 179.0 (*)    Bicarbonate 3.9 (*)    Acid-base deficit 28.0 (*)    All other components within normal limits  I-STAT ARTERIAL BLOOD GAS, ED - Abnormal; Notable for the following:    pH, Arterial 6.857 (*)    pO2, Arterial 504.0 (*)    Bicarbonate 6.4 (*)    Acid-base deficit 27.0 (*)    All other components within normal limits  CULTURE, BLOOD (ROUTINE X 2)  CULTURE, BLOOD (ROUTINE X 2)  URINE CULTURE  ETHANOL  SALICYLATE LEVEL  URINE MICROSCOPIC-ADD ON  URINE RAPID DRUG SCREEN, HOSP PERFORMED  BLOOD GAS, VENOUS  BASIC METABOLIC PANEL  BASIC METABOLIC PANEL  BASIC METABOLIC PANEL  MAGNESIUM  TROPONIN I  TROPONIN I  TROPONIN I  PROTIME-INR  CBG MONITORING, ED  I-STAT TROPOININ, ED  I-STAT CG4 LACTIC ACID, ED    Imaging Review Dg Chest Portable 1 View  07/30/2015   CLINICAL DATA:  Found unresponsive  EXAM: PORTABLE CHEST - 1 VIEW  COMPARISON:  07/01/2015  FINDINGS: Endotracheal and nasogastric tubes are appropriately positioned. Evidence of aortic valvuloplasty. Heart size normal. The lungs are clear.  IMPRESSION: No acute cardiopulmonary process.   Electronically  Signed   By: Christiana Pellant M.D.   On: 07/30/2015 10:03   I have personally reviewed and evaluated these images and lab results as part of my medical decision-making.   EKG Interpretation None      MDM   Final diagnoses:  Diabetic ketoacidosis with coma associated with other specified diabetes mellitus   Dr. Ethelda Chick has seen patient as well. He intubated this patient and has been highly involved in the case.   ED ECG REPORT  Date:  07/30/2015  Rate: 112  Rhythm: sinus tachycardia  QRS Axis: normal  Intervals: normal  ST/T Wave abnormalities: peaked T-wave abnormality  Conduction Disutrbances:nonspecific intraventricular conduction delay  Narrative Interpretation:   Old EKG Reviewed: changes noted from July 06/02/2015  I have personally reviewed the EKG tracing and agree with the computerized printout as noted. - per Dr. Ethelda Chick.   CRITICAL CARE Performed by: Dorthula Matas Total critical care time: 35 Critical care time was exclusive of separately billable procedures and treating other patients. Critical care was necessary to treat or prevent imminent or life-threatening deterioration. Critical care was time spent personally by me on the following activities: development of treatment plan with patient and/or surrogate as well as nursing, discussions with consultants, evaluation of patient's response to treatment, examination of patient, obtaining history from patient or surrogate, ordering and performing treatments and interventions, ordering and review of laboratory studies, ordering and review of radiographic studies, pulse oximetry and re-evaluation of patient's condition.   @ 10:10 am - Critical Care contacted have agreed to come see the patient. I believe I spoke with Dr. Isaiah Serge.  Patient admitted by critical care.   Marlon Pel, PA-C 07/30/15 1152  Doug Sou, MD 07/30/15 1212

## 2015-07-30 NOTE — Procedures (Signed)
Arterial Catheter Insertion Procedure Note Madison [name] [id number] September 19, 1961  Procedure: Insertion of Arterial Catheter  Indications: Blood pressure monitoring and Frequent blood sampling  Procedure Details Consent: Unable to obtain consent because of altered level of consciousness. Time Out: Verified patient identification, verified procedure, site/side was marked, verified correct patient position, special equipment/implants available, medications/allergies/relevent history reviewed, required imaging and test results available.  Performed  Maximum sterile technique was used including antiseptics, cap, gloves, gown, hand hygiene, mask and sheet. Skin prep: Chlorhexidine; local anesthetic administered 20 gauge catheter was inserted into right femoral artery using the Seldinger technique.  Evaluation Blood flow good; BP tracing good. Complications: No apparent complications.  Chilton Greathouse MD [place] [facility] Pager [phone] If no answer or after 3pm call: [phone] 07/30/2015, 2:55 PM

## 2015-07-30 NOTE — Procedures (Signed)
Central Venous Catheter Insertion Procedure Note JIAH BARI [id number] [birth year]  Procedure: Insertion of Central Venous Catheter Indications: Assessment of intravascular volume, Drug and/or fluid administration and Frequent blood sampling  Procedure Details Consent: Unable to obtain consent because of emergent medical necessity. Time Out: Verified patient identification, verified procedure, site/side was marked, verified correct patient position, special equipment/implants available, medications/allergies/relevent history reviewed, required imaging and test results available.  Performed  Maximum sterile technique was used including antiseptics, cap, gloves, gown, hand hygiene, mask and sheet. Skin prep: Chlorhexidine; local anesthetic administered A antimicrobial bonded/coated triple lumen catheter was placed in the right femoral vein due to emergent situation using the Seldinger technique.  Evaluation Blood flow good Complications: No apparent complications Patient did tolerate procedure well. Chest X-ray ordered to verify placement.  CXR: not ordered.  Chilton Greathouse MD [place] [facility] Pager [phone] If no answer or after 3pm call: [phone] 07/30/2015, 2:57 PM

## 2015-07-30 NOTE — H&P (Signed)
PULMONARY / CRITICAL CARE MEDICINE   Name: Madison [name] MRN: [id number] DOB: [birth year]    ADMISSION DATE:  07/30/2015 CONSULTATION DATE:  07/30/15  REFERRING MD :  Dr. Ethelda Chick  CHIEF COMPLAINT:  Hypotension / AMS   INITIAL PRESENTATION: 54 y/o F admitted on 9/10 after being found altered at home, incontinent of bowel/bladder, emesis and altered.  Recent admission for LLE fracture with d/c to SNF.  Found at home by [facility].    STUDIES:  9/10  CT Head >> 9/10  CT Neck >>  SIGNIFICANT EVENTS: 9/10  Admit after being found altered at home.  Concern for DKA   HISTORY OF PRESENT ILLNESS:  54 y/o F with PMH of obesity, CAD s/p CABG, bicuspid AV with prosthetic AVR (2012) on chronic coumadin, multiple strokes, RA,  HLD, HTN, AS, MI (12/2014), CHF, GERD, anxiety, depression, anxiety, DM and recent admit from 8/12-8/20 after a mechanical fall with left ankle fracture who presented to [facility] ER 9/10 after being found altered at home by [facility] RN.  She was recently discharged from [name] rehab.   Staff reports that patient was found at home lying beside bed altered by [facility] RN.  She was noted to be incontinent of bowel/bladder and covered in emesis.  On arrival, the patient was noted to be hypotensive and CBG read high.  She was unresponsive on arrival to ER and was intubated by the EDP.  Initial labs:  NA 127, K >7.5, CO2 <5, sr cr 2.31, glucose 1284, albumin 3.2, troponin 0.0, WBC 43, Hgb 9.6, and platelets 425.  Initial CXR post intubation showed no acute process and lines/tubes in good position.  The patient was treated for hyperkalemia with insulin and bicarbonate.  Repeat K 6.6, sr cr 1.20.  Lactic acid 8.65.  PCCM called for ICU admission.    PAST MEDICAL HISTORY :   has a past medical history of CAD (coronary artery disease); Obesity; Hypercholesteremia; HTN (hypertension); Dyslipidemia; Aortic stenosis; Thrombophlebitis; Heart murmur; Myocardial infarction (12/2014); Pneumonia (11/2014; 12/2014); GERD  (gastroesophageal reflux disease); Stroke syndrome (1995; 2001); Anxiety; Depression; Allergy; CHF (congestive heart failure); Rheumatoid arthritis(714.0); and Diabetes (dx'd 1982).  has past surgical history that includes Cesarean section (1995); Foot fracture surgery; Knee arthroscopy (Right); Neuroplasty / transposition median nerve at carpal tunnel; left heart catheterization with coronary angiogram (N/A, 12/08/2014); percutaneous coronary stent intervention (pci-s) (N/A, 12/09/2014); Cardiac catheterization (12/08/2014); Coronary angioplasty with stent (12/09/2014); Aortic valve replacement (avr)/coronary artery bypass grafting (cabg) ("2014"); Fracture surgery; Tubal ligation (1995); Cataract extraction (Left); Coronary artery bypass graft; and ORIF ankle fracture (Left, 07/04/2015).    Prior to Admission medications   Medication Sig Start Date End Date Taking? Authorizing Provider  aspirin 81 MG tablet Take 1 tablet (81 mg total) by mouth daily. 10/11/14   Leana Roe Elgergawy, MD  baclofen (LIORESAL) 10 MG tablet TAKE 1 TABLET (10 MG TOTAL) BY MOUTH 2 (TWO) TIMES DAILY. 06/29/15   [name], NP  clonazePAM (KLONOPIN) 0.5 MG tablet Take 1 tablet (0.5 mg total) by mouth 2 (two) times daily. 07/08/15   Kathlen Mody, MD  escitalopram (LEXAPRO) 20 MG tablet Take 20 mg by mouth daily.    Historical Provider, MD  glucagon (GLUCAGON EMERGENCY) 1 MG injection Inject 1 mg into the vein once as needed. 02/08/15   Romero Belling, MD  glucose blood (ACCU-CHEK AVIVA) test strip Use to check blood sugar 5 times per day Dx Code E10.10 And lancets 5/day 01/10/15   Romero Belling, MD  HYDROcodone-acetaminophen (  NORCO) 7.5-325 MG per tablet Take 1-2 tablets by mouth every 6 (six) hours as needed for moderate pain. 07/04/15   Naiping Donnelly Stager, MD  insulin aspart (NOVOLOG) 100 UNIT/ML injection Inject 0-15 Units into the skin 3 (three) times daily with meals. 07/08/15   Kathlen Mody, MD  insulin detemir (LEVEMIR) 100 UNIT/ML  injection Inject 0.18 mLs (18 Units total) into the skin 2 (two) times daily. 07/08/15   Kathlen Mody, MD  lisinopril (PRINIVIL,ZESTRIL) 2.5 MG tablet TAKE 1 TABLET BY MOUTH EVERY DAY 04/05/15   [name], NP  metoprolol tartrate (LOPRESSOR) 25 MG tablet Take 0.5 tablets (12.5 mg total) by mouth 2 (two) times daily. Patient taking differently: Take 25 mg by mouth daily.  12/15/14   Nishant Dhungel, MD  omeprazole (PRILOSEC) 40 MG capsule TAKE 1 CAPSULE (40 MG TOTAL) BY MOUTH DAILY. 11/05/14   Nelwyn Salisbury, MD  pravastatin (PRAVACHOL) 20 MG tablet TAKE 1 TABLET BY MOUTH EVERY DAY 02/16/15   Eulis Foster, FNP  RHEUMATREX 2.5 MG tablet TAKE 1 TABLET 2 TIMES A WEEK Patient taking differently: TAKE 1 TABLET 2 TIMES A WEEK (MON AND FRI) 05/24/15   [name], NP  solifenacin (VESICARE) 5 MG tablet Take 1 tablet (5 mg total) by mouth daily. 05/03/15   [name], NP  traZODone (DESYREL) 50 MG tablet Take 0.5-1 tablets (25-50 mg total) by mouth at bedtime as needed for sleep. 07/08/15   Kathlen Mody, MD  warfarin (COUMADIN) 5 MG tablet TAKE 1-1.5 BY MOUTH DAILY AT 6PM. TAKE 1 TAB DAILY TUES,TH,F,SAT & SUN. TAKE 1& 1/2 TAB ON MON &WED Patient taking differently: Take 5 mg daily on Mon, Wed, Fri. 7.5 mg on all other days 05/16/15   Nishant Dhungel, MD  zolpidem (AMBIEN) 10 MG tablet Take 10 mg by mouth at bedtime as needed. for sleep 06/17/15   Historical Provider, MD   Allergies  Allergen Reactions  . Celebrex [Celecoxib] Rash  . Detrol [Tolterodine] Hives    FAMILY HISTORY:  indicated that her mother is alive. She indicated that her father is deceased. She indicated that all of her five sisters are alive. She indicated that both of her brothers are alive.    SOCIAL HISTORY:  reports that she has never smoked. She has never used smokeless tobacco. She reports that she does not drink alcohol or use illicit drugs.  REVIEW OF SYSTEMS:  Unable to complete as patient is altered on MV.   SUBJECTIVE:    VITAL SIGNS: Temp:  [94.5 F (34.7 C)] 94.5 F (34.7 C) (09/10 0937) Pulse Rate:  [109-135] 109 (09/10 1110) Resp:  [22-37] 24 (09/10 1115) BP: (72-120)/(33-49) 75/48 mmHg (09/10 1115) SpO2:  [100 %] 100 % (09/10 1110) FiO2 (%):  [100 %] 100 % (09/10 0925)   HEMODYNAMICS:     VENTILATOR SETTINGS: Vent Mode:  [-] PRVC FiO2 (%):  [100 %] 100 % Set Rate:  [24 bmp] 24 bmp Vt Set:  [420 mL] 420 mL PEEP:  [5 cmH20] 5 cmH20 Plateau Pressure:  [16 cmH20] 16 cmH20   INTAKE / OUTPUT:  Intake/Output Summary (Last 24 hours) at 07/30/15 1123 Last data filed at 07/30/15 1025  Gross per 24 hour  Intake   1000 ml  Output      0 ml  Net   1000 ml    PHYSICAL EXAMINATION: General:  Chronically ill appearing female on vent Neuro:  Obtunded, no response painful stimuli HEENT:  OETT, MM pink/dry, no jvd  Cardiovascular:  s1s2 rrr, no m/r/g Lungs:  resp's even/non-labored on MV, lungs bilaterally clear Abdomen:  Obese/soft, bsx4 active  Musculoskeletal:  No acute deformities, LLE in walking boot Skin:  Warm/dry, no edema   LABS:  CBC  Recent Labs Lab 07/30/15 0935 07/30/15 0944  WBC 43.0*  --   HGB 9.6* 12.2  HCT 34.5* 36.0  PLT 425*  --    Coag's No results for input(s): APTT, INR in the last 168 hours.   BMET  Recent Labs Lab 07/30/15 0935 07/30/15 0944  NA 127* 127*  K >7.5* 6.6*  CL 92* 100*  CO2 <5*  --   BUN 33* 38*  CREATININE 2.31* 1.20*  GLUCOSE 1284* >700*   Electrolytes  Recent Labs Lab 07/30/15 0935  CALCIUM 9.0   Sepsis Markers  Recent Labs Lab 07/30/15 0945  LATICACIDVEN 8.65*   ABG No results for input(s): PHART, PCO2ART, PO2ART in the last 168 hours.   Liver Enzymes  Recent Labs Lab 07/30/15 0935  AST 41  ALT 21  ALKPHOS 124  BILITOT 1.6*  ALBUMIN 3.2*   Cardiac Enzymes No results for input(s): TROPONINI, PROBNP in the last 168 hours.   Glucose No results for input(s): GLUCAP in the last 168 hours.  Imaging Dg  Chest Portable 1 View  07/30/2015   CLINICAL DATA:  Found unresponsive  EXAM: PORTABLE CHEST - 1 VIEW  COMPARISON:  07/01/2015  FINDINGS: Endotracheal and nasogastric tubes are appropriately positioned. Evidence of aortic valvuloplasty. Heart size normal. The lungs are clear.  IMPRESSION: No acute cardiopulmonary process.   Electronically Signed   By: Christiana Pellant M.D.   On: 07/30/2015 10:03     ASSESSMENT / PLAN:  PULMONARY OETT 9/10 >>  A: Acute Respiratory Failure - intubated secondary to AMS, airway protection  P:   MV support, Vt 480 Wean PEEP/FiO2 for saturations > 92% Increased rate in setting of acidosis Follow up ABG Intermittent CXR  CARDIOVASCULAR CVL L Femoral 9/10 >> L Femoral Aline 9/10 >> A:  Shock - rule out septic etiology.  ECHO in 01/2015 with EF 60-65%.  Profound acidosis  Hypothermia - temp on admit 94 Hx CAD s/p CABG w AVR, HTN, HLD, CHF  P:  Levophed for MAP >65 See ID  NS x4L, then 125 Bair hugger / warming  ICU admit / monitoring  Assess BNP Assess EKG  RENAL A:   Metabolic Acidosis - AG 10 on presentation, elevated lactic acid.  Found with emesis + incontinent Hyperkalemia  Acute Kidney Injury - resolving with IVF's Rule out Rhabdomyolysis  P:   Trend lactic acid Monitor BMP / UOP  Replace electrolytes as indicated  Assess CK  GASTROINTESTINAL A:   Vomiting - prior to admit Incontinence - prior to admit in setting of AMS Hx GERD P:   PPI for SUP NPO OGT Consider TF in am 9/11 if remains intubated  PRN zofran   HEMATOLOGIC A:   Anemia  Leukocytosis  Coagulopathy - chronic coumadin therapy P:  Trend CBC  Hold heparin / coumadin until CT head cleared  Assess PT/INR Will need to start heparin gtt if CT head negative   INFECTIOUS A:   Leukocytosis - initial WBX 43k R/O Septic Shock  P:   BCx2 9/10 >>  UC 9/10 >>  UA 9/10 >>  Sputum 9/10 >>   Vanco (empiric), start date 9/10, day 1/x Zosyn (empiric), start date  9/10, day 1/x   Assess PCT / lactic acid  Monitor fever curve / WBC Assess UDS / ETOH (<5)   ENDOCRINE A:   DKA  P:   Insulin gtt per DKA protocol  CBG's Q1 hour   NEUROLOGIC A:   Acute Encephalopathy - in the setting of DKA, metabolic acidosis R/O Neurologic event  P:   RASS goal: 0 Hold sedation, allow to wake Assess CT head / neck to rule out neurologic event   FAMILY  - Updates: No family available.    - Inter-disciplinary family meet or Palliative Care meeting due by: 9/18    Canary Brim, NP-C [place] [facility] Pgr: [phone] or if no answer [phone] 07/30/2015, 11:23 AM

## 2015-07-30 NOTE — ED Notes (Signed)
Code Sepsis activated @ 09:41 AM

## 2015-07-30 NOTE — ED Notes (Signed)
Code Sepsis activated @ 09:41 AM  

## 2015-07-30 NOTE — Progress Notes (Signed)
eLink Physician-Brief Progress Note Patient Name: Madison [name] DOB: 16-Nov-1961 MRN: [id number]   Date of Service  07/30/2015  HPI/Events of Note  Ct head.  Neg so start hep drip, on warfarin before.  Also cortisol 21, stop solu cortef  eICU Interventions  See orders     Intervention Category Major Interventions: Sepsis - evaluation and management  Shan Levans 07/30/2015, 7:54 PM

## 2015-07-30 NOTE — ED Notes (Signed)
Informed provider that we have been unable to gain further IV access. Other medications held due to this.

## 2015-07-30 NOTE — ED Notes (Signed)
Notified EDP about pts abnormal labs

## 2015-07-30 NOTE — Progress Notes (Signed)
ANTICOAGULATION CONSULT NOTE - Initial Consult  Pharmacy Consult:  Heparin Indication:  History of prosthetic AVR and CVAs  Allergies  Allergen Reactions  . Celebrex [Celecoxib] Rash  . Detrol [Tolterodine] Hives    Patient Measurements: Height = 63 inches Weight = 76 kg Heparin Dosing Weight: 69 kg  Vital Signs: Temp: 98.9 F (37.2 C) (09/10 1915) Temp Source: Core (Comment) (09/10 1800) BP: 102/47 mmHg (09/10 1900) Pulse Rate: 120 (09/10 1915)  Labs:  Recent Labs  07/30/15 0935 07/30/15 0944 07/30/15 1400 07/30/15 1800  HGB 9.6* 12.2  --   --   HCT 34.5* 36.0  --   --   PLT 425*  --   --   --   LABPROT 34.4*  --   --   --   INR 3.50*  --   --   --   CREATININE 2.31* 1.20* 2.02* 1.96*  CKTOTAL  --   --  31*  --   TROPONINI <0.03  --   --  <0.03    CrCl cannot be calculated (Unknown ideal weight.).   Medical History: Past Medical History  Diagnosis Date  . CAD (coronary artery disease)   . Obesity   . Hypercholesteremia   . HTN (hypertension)   . Dyslipidemia   . Aortic stenosis   . Thrombophlebitis   . Heart murmur   . Myocardial infarction 12/2014  . Pneumonia 11/2014; 12/2014  . GERD (gastroesophageal reflux disease)   . 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 Anxiety   . Depression   . Allergy   . CHF (congestive heart failure)   . Rheumatoid arthritis(714.0)     "hands" (07/01/2015)  . Diabetes dx'd 1982    "went straight to insulin"      Assessment: 54 YOF on Coumadin for history of prosthetic AVR and multiple CVAs.  Pharmacy consulted to initiate IV heparin while Coumadin is on hold.  INR currently supra-therapeutic at 3.5; no bleeding on head CT.   Goal of Therapy:  Heparin level 0.3-0.7 units/ml Monitor platelets by anticoagulation protocol: Yes  Vanc trough: 15-20 mcg/mL    Plan:  - PT/INR in AM, start IV heparin when < 2 - Vanc 750mg  IV Q12H - Zosyn 3.375gm  IV Q8H, 4 hr infusion - Monitor renal fxn, clinical progress, vanc trough as indicated    Sumeya Yontz D. 12-26-1982, PharmD, BCPS Pager:  [phone] 07/30/2015, 8:05 PM

## 2015-07-31 ENCOUNTER — Inpatient Hospital Stay (HOSPITAL_COMMUNITY): Payer: Commercial Managed Care - HMO

## 2015-07-31 DIAGNOSIS — I6330 Cerebral infarction due to thrombosis of unspecified cerebral artery: Secondary | ICD-10-CM | POA: Diagnosis not present

## 2015-07-31 DIAGNOSIS — E139 Other specified diabetes mellitus without complications: Secondary | ICD-10-CM | POA: Diagnosis not present

## 2015-07-31 DIAGNOSIS — I10 Essential (primary) hypertension: Secondary | ICD-10-CM | POA: Diagnosis not present

## 2015-07-31 LAB — BASIC METABOLIC PANEL
ANION GAP: 9 (ref 5–15)
ANION GAP: 9 (ref 5–15)
BUN: 20 mg/dL (ref 6–20)
BUN: 18 mg/dL (ref 6–20)
CALCIUM: 8.3 mg/dL — AB (ref 8.9–10.3)
CO2: 19 mmol/L — AB (ref 22–32)
CO2: 20 mmol/L — AB (ref 22–32)
CREATININE: 1.19 mg/dL — AB (ref 0.44–1.00)
Calcium: 8.2 mg/dL — ABNORMAL LOW (ref 8.9–10.3)
Chloride: 117 mmol/L — ABNORMAL HIGH (ref 101–111)
Chloride: 118 mmol/L — ABNORMAL HIGH (ref 101–111)
Creatinine, Ser: 1.25 mg/dL — ABNORMAL HIGH (ref 0.44–1.00)
GFR calc Af Amer: 55 mL/min — ABNORMAL LOW (ref >60)
GFR calc non Af Amer: 48 mL/min — ABNORMAL LOW (ref >60)
GFR, EST AFRICAN AMERICAN: 59 mL/min — AB (ref >60)
GFR, EST NON AFRICAN AMERICAN: 51 mL/min — AB (ref >60)
GLUCOSE: 114 mg/dL — AB (ref 65–99)
Glucose, Bld: 131 mg/dL — ABNORMAL HIGH (ref 65–99)
POTASSIUM: 3.6 mmol/L (ref 3.5–5.1)
Potassium: 3.5 mmol/L (ref 3.5–5.1)
SODIUM: 147 mmol/L — AB (ref 135–145)
Sodium: 145 mmol/L (ref 135–145)

## 2015-07-31 LAB — BLOOD GAS, ARTERIAL
ACID-BASE DEFICIT: 3.5 mmol/L — AB (ref 0.0–2.0)
Bicarbonate: 19.7 mEq/L — ABNORMAL LOW (ref 20.0–24.0)
FIO2: 0.3
LHR: 14 {breaths}/min
MECHVT: 480 mL
O2 Saturation: 99.3 %
PEEP/CPAP: 5 cmH2O
PO2 ART: 152 mmHg — AB (ref 80.0–100.0)
Patient temperature: 98.6
TCO2: 20.5 mmol/L (ref 0–100)
pCO2 arterial: 27.7 mmHg — ABNORMAL LOW (ref 35.0–45.0)
pH, Arterial: 7.465 — ABNORMAL HIGH (ref 7.350–7.450)

## 2015-07-31 LAB — PROTIME-INR
INR: 5.85 — AB (ref 0.00–1.49)
INR: 3.1 — ABNORMAL HIGH (ref 0.00–1.49)
PROTHROMBIN TIME: 50.6 s — AB (ref 11.6–15.2)
PROTHROMBIN TIME: 31.4 s — AB (ref 11.6–15.2)

## 2015-07-31 LAB — GLUCOSE, CAPILLARY
GLUCOSE-CAPILLARY: 117 mg/dL — AB (ref 65–99)
GLUCOSE-CAPILLARY: 120 mg/dL — AB (ref 65–99)
GLUCOSE-CAPILLARY: 128 mg/dL — AB (ref 65–99)
GLUCOSE-CAPILLARY: 130 mg/dL — AB (ref 65–99)
GLUCOSE-CAPILLARY: 156 mg/dL — AB (ref 65–99)
GLUCOSE-CAPILLARY: 160 mg/dL — AB (ref 65–99)
GLUCOSE-CAPILLARY: 182 mg/dL — AB (ref 65–99)
GLUCOSE-CAPILLARY: 189 mg/dL — AB (ref 65–99)
GLUCOSE-CAPILLARY: 241 mg/dL — AB (ref 65–99)
GLUCOSE-CAPILLARY: 320 mg/dL — AB (ref 65–99)
GLUCOSE-CAPILLARY: 544 mg/dL — AB (ref 65–99)
GLUCOSE-CAPILLARY: 572 mg/dL — AB (ref 65–99)
Glucose-Capillary: 127 mg/dL — ABNORMAL HIGH (ref 65–99)
Glucose-Capillary: 130 mg/dL — ABNORMAL HIGH (ref 65–99)
Glucose-Capillary: 132 mg/dL — ABNORMAL HIGH (ref 65–99)
Glucose-Capillary: 200 mg/dL — ABNORMAL HIGH (ref 65–99)
Glucose-Capillary: 238 mg/dL — ABNORMAL HIGH (ref 65–99)
Glucose-Capillary: 295 mg/dL — ABNORMAL HIGH (ref 65–99)
Glucose-Capillary: 362 mg/dL — ABNORMAL HIGH (ref 65–99)
Glucose-Capillary: 411 mg/dL — ABNORMAL HIGH (ref 65–99)

## 2015-07-31 LAB — CK: Total CK: 128 U/L (ref 38–234)

## 2015-07-31 LAB — POCT I-STAT 3, ART BLOOD GAS (G3+)
Acid-Base Excess: 1 mmol/L (ref 0.0–2.0)
Bicarbonate: 20.7 mEq/L (ref 20.0–24.0)
O2 Saturation: 100 %
PCO2 ART: 19 mmHg — AB (ref 35.0–45.0)
PH ART: 7.65 — AB (ref 7.350–7.450)
TCO2: 21 mmol/L (ref 0–100)
pO2, Arterial: 177 mmHg — ABNORMAL HIGH (ref 80.0–100.0)

## 2015-07-31 LAB — CBC
HEMATOCRIT: 26.2 % — AB (ref 36.0–46.0)
Hemoglobin: 8.6 g/dL — ABNORMAL LOW (ref 12.0–15.0)
MCH: 25.7 pg — ABNORMAL LOW (ref 26.0–34.0)
MCHC: 32.8 g/dL (ref 30.0–36.0)
MCV: 78.4 fL (ref 78.0–100.0)
PLATELETS: 206 10*3/uL (ref 150–400)
RBC: 3.34 MIL/uL — AB (ref 3.87–5.11)
RDW: 19.9 % — AB (ref 11.5–15.5)
WBC: 23.4 10*3/uL — AB (ref 4.0–10.5)

## 2015-07-31 LAB — URINE CULTURE
CULTURE: NO GROWTH
Culture: NO GROWTH

## 2015-07-31 LAB — RAPID URINE DRUG SCREEN, HOSP PERFORMED
AMPHETAMINES: NOT DETECTED
Barbiturates: NOT DETECTED
Benzodiazepines: NOT DETECTED
Cocaine: NOT DETECTED
OPIATES: NOT DETECTED
Tetrahydrocannabinol: NOT DETECTED

## 2015-07-31 LAB — TROPONIN I

## 2015-07-31 LAB — TYPE AND SCREEN
ABO/RH(D): A POS
Antibody Screen: NEGATIVE

## 2015-07-31 LAB — PROCALCITONIN: PROCALCITONIN: 11.66 ng/mL

## 2015-07-31 LAB — PHOSPHORUS

## 2015-07-31 LAB — MAGNESIUM: Magnesium: 1.5 mg/dL — ABNORMAL LOW (ref 1.7–2.4)

## 2015-07-31 MED ORDER — INSULIN ASPART 100 UNIT/ML ~~LOC~~ SOLN
2.0000 [IU] | SUBCUTANEOUS | Status: DC
  Administered 2015-07-31 – 2015-08-02 (×10): 2 [IU] via SUBCUTANEOUS

## 2015-07-31 MED ORDER — INSULIN DETEMIR 100 UNIT/ML ~~LOC~~ SOLN
20.0000 [IU] | SUBCUTANEOUS | Status: DC
  Administered 2015-07-31 – 2015-08-02 (×3): 20 [IU] via SUBCUTANEOUS
  Filled 2015-07-31 (×3): qty 0.2

## 2015-07-31 MED ORDER — POTASSIUM CHLORIDE 10 MEQ/50ML IV SOLN
10.0000 meq | INTRAVENOUS | Status: AC
  Administered 2015-07-31 (×4): 10 meq via INTRAVENOUS
  Filled 2015-07-31 (×4): qty 50

## 2015-07-31 MED ORDER — VITAL HIGH PROTEIN PO LIQD
1000.0000 mL | ORAL | Status: DC
  Administered 2015-07-31: 21:00:00
  Administered 2015-07-31: 1000 mL
  Administered 2015-07-31: 19:00:00
  Administered 2015-08-01: 1000 mL
  Filled 2015-07-31 (×3): qty 1000

## 2015-07-31 MED ORDER — SODIUM CHLORIDE 0.9 % IV SOLN
Freq: Once | INTRAVENOUS | Status: AC
Start: 2015-07-31 — End: 2015-07-31

## 2015-07-31 MED ORDER — MAGNESIUM SULFATE 2 GM/50ML IV SOLN
2.0000 g | Freq: Once | INTRAVENOUS | Status: AC
  Administered 2015-07-31: 2 g via INTRAVENOUS
  Filled 2015-07-31: qty 50

## 2015-07-31 MED ORDER — POTASSIUM PHOSPHATES 15 MMOLE/5ML IV SOLN
40.0000 meq | Freq: Once | INTRAVENOUS | Status: AC
  Administered 2015-07-31: 40 meq via INTRAVENOUS
  Filled 2015-07-31: qty 9.09

## 2015-07-31 MED ORDER — INSULIN ASPART 100 UNIT/ML ~~LOC~~ SOLN
0.0000 [IU] | SUBCUTANEOUS | Status: DC
  Administered 2015-07-31: 5 [IU] via SUBCUTANEOUS
  Administered 2015-07-31: 11 [IU] via SUBCUTANEOUS
  Administered 2015-08-01 (×2): 3 [IU] via SUBCUTANEOUS
  Administered 2015-08-01 (×2): 5 [IU] via SUBCUTANEOUS
  Administered 2015-08-01: 2 [IU] via SUBCUTANEOUS
  Administered 2015-08-02: 3 [IU] via SUBCUTANEOUS
  Administered 2015-08-02: 11 [IU] via SUBCUTANEOUS
  Administered 2015-08-02: 8 [IU] via SUBCUTANEOUS
  Administered 2015-08-02: 5 [IU] via SUBCUTANEOUS
  Administered 2015-08-02 – 2015-08-03 (×2): 3 [IU] via SUBCUTANEOUS
  Administered 2015-08-03: 5 [IU] via SUBCUTANEOUS
  Administered 2015-08-03: 15 [IU] via SUBCUTANEOUS
  Administered 2015-08-03: 2 [IU] via SUBCUTANEOUS
  Administered 2015-08-04: 5 [IU] via SUBCUTANEOUS

## 2015-07-31 NOTE — Progress Notes (Signed)
eLink Physician-Brief Progress Note Patient Name: Madison [name] DOB: 1961/03/17 MRN: [id number]   Date of Service  07/31/2015  HPI/Events of Note  K+ = 3.2 and Creatinine = 1.58.  eICU Interventions  Will replete K+.     Intervention Category Intermediate Interventions: Electrolyte abnormality - evaluation and management  Elwin Tsou Eugene 07/31/2015, 12:35 AM

## 2015-07-31 NOTE — Progress Notes (Signed)
RT pulled ABG following RN request. No foloow up ABG drawn following intubation. Critical values called to Dr. Arsenio Loader w/ Pola Corn.  Results for Madison [name], Madison [name] (MRN [id number]) as of 07/31/2015  pH, Arterial 7.650 (HH)  pCO2 arterial 19.0 (LL)  pO2, Arterial 177.0 (H)  Bicarbonate 20.7  TCO2 21  Acid-Base 1.0  O2 Saturation 100.0  Patient temp 100.7 F  Collection site ARTERIAL   Verbal vent changes ordered. Repeat ABG ordered.  RT will monitor.

## 2015-07-31 NOTE — Progress Notes (Signed)
eLink Physician-Brief Progress Note Patient Name: [name] DOB: 1961-08-31 MRN: [id number]   Date of Service  07/31/2015  HPI/Events of Note  Hyperglycemia On D5 1/2 NS  eICU Interventions  Stop D5     Intervention Category Intermediate Interventions: Hyperglycemia - evaluation and treatment  MCQUAID, DOUGLAS 07/31/2015, 10:48 PM

## 2015-07-31 NOTE — Progress Notes (Signed)
eLink Physician-Brief Progress Note Patient Name: Madison [name] DOB: 1961-10-11 MRN: [id number]   Date of Service  07/31/2015  HPI/Events of Note  Coagulopathy - INR = 5.85. Related to Warfarin therapy and antibiotics. Warfarin appears to have been held.   eICU Interventions  Will order: 1. FFP 3 units IV when available. 2. Repeat PT/INR at 1 PM.     Intervention Category Major Interventions: Other:  Lenell Antu 07/31/2015, 7:14 AM

## 2015-07-31 NOTE — Progress Notes (Signed)
Utilization review completed.  

## 2015-07-31 NOTE — Progress Notes (Signed)
CRITICAL VALUE ALERT  Critical value received: INR 5.85  Date of notification:  07/31/2015  Time of notification:  0639  Critical value read back:Yes.    Nurse who received alert:  Berenise Hunton/Greg Hardock (Information relayed to elink)  MD notified (1st page):  Elink  Time of first page:  [phone]  MD notified (2nd page):  Time of second page:  Responding MD:  Pola Corn  Time MD responded:  [phone]

## 2015-07-31 NOTE — Plan of Care (Signed)
Problem: Phase I Progression Outcomes Goal: NPO or per MD order Outcome: Completed/Met Date Met:  07/31/15 Tube Feeding started on 07/31/15

## 2015-07-31 NOTE — Progress Notes (Signed)
PULMONARY / CRITICAL CARE MEDICINE   Name: Madison [name] MRN: [id number] DOB: 30-Jan-1961    ADMISSION DATE:  07/30/2015 CONSULTATION DATE:  07/30/15  REFERRING MD :  Dr. Ethelda Chick  CHIEF COMPLAINT:  Hypotension / AMS   INITIAL PRESENTATION: 54 y/o F admitted on 9/10 after being found altered at home, incontinent of bowel/bladder, emesis and altered.  Recent admission for LLE fracture with d/c to SNF.  Found at home by [facility].    STUDIES:  9/10  CT Head >> no acute pathology, chronic ischemic changes, possible R cervical adenopathy 9/10  CT Neck >> no cervical spine injury  SIGNIFICANT EVENTS: 9/10  Admit after being found altered at home.  Concern for DKA & possible sepsis    SUBJECTIVE: RT reports patient's vent settings adjusted, bicarb gtt d/c'd overnight. Pt more alert.  RN notes CBG's within range for possible d/c of insulin gtt.    VITAL SIGNS: Temp:  [92.5 F (33.6 C)-100.8 F (38.2 C)] 100.6 F (38.1 C) (09/11 0800) Pulse Rate:  [99-135] 100 (09/11 0800) Resp:  [14-37] 21 (09/11 0800) BP: (72-149)/(33-67) 136/67 mmHg (09/11 0800) SpO2:  [100 %] 100 % (09/11 0800) Arterial Line BP: (84-144)/(31-87) 144/54 mmHg (09/11 0800) FiO2 (%):  [30 %-100 %] 30 % (09/11 0737) Weight:  [167 lb 8.8 oz (76 kg)] 167 lb 8.8 oz (76 kg) (09/11 0500)   HEMODYNAMICS:     VENTILATOR SETTINGS: Vent Mode:  [-] PRVC FiO2 (%):  [30 %-100 %] 30 % Set Rate:  [14 bmp-32 bmp] 14 bmp Vt Set:  [420 mL-480 mL] 480 mL PEEP:  [5 cmH20] 5 cmH20 Plateau Pressure:  [15 cmH20-18 cmH20] 16 cmH20   INTAKE / OUTPUT:  Intake/Output Summary (Last 24 hours) at 07/31/15 0839 Last data filed at 07/31/15 0800  Gross per 24 hour  Intake 9915.53 ml  Output   2775 ml  Net 7140.53 ml    PHYSICAL EXAMINATION: General:  Chronically ill appearing female on vent Neuro:  Arouses to voice, moves spontaneously, intermittent agitation  HEENT:  OETT, MM pink/dry, no jvd Cardiovascular:  s1s2 rrr, no  m/r/g Lungs:  resp's even/non-labored on MV, lungs bilaterally clear Abdomen:  Obese/soft, bsx4 active  Musculoskeletal:  No acute deformities, LLE in walking boot Skin:  Warm/dry, no edema   LABS:  CBC  Recent Labs Lab 07/30/15 0935 07/30/15 0944 07/31/15 0604  WBC 43.0*  --  23.4*  HGB 9.6* 12.2 8.6*  HCT 34.5* 36.0 26.2*  PLT 425*  --  206   Coag's  Recent Labs Lab 07/30/15 0935 07/31/15 0604  INR 3.50* 5.85*    BMET  Recent Labs Lab 07/30/15 1400 07/30/15 1800 07/30/15 2248  NA 136 139 144  K 5.5* 4.1 3.2*  CL 109 114* 118*  CO2 5* 7* 12*  BUN 28* 26* 23*  CREATININE 2.02* 1.96* 1.58*  GLUCOSE 904* 655* 267*   Electrolytes  Recent Labs Lab 07/30/15 0935 07/30/15 1400 07/30/15 1800 07/30/15 2248  CALCIUM 9.0 7.5* 8.0* 8.3*  MG 3.2*  --   --   --    Sepsis Markers  Recent Labs Lab 07/30/15 0945 07/30/15 1400 07/30/15 1821 07/31/15 0515  LATICACIDVEN 8.65*  --  4.4*  --   PROCALCITON  --  7.08  --  11.66   ABG  Recent Labs Lab 07/30/15 1125 07/31/15 0406 07/31/15 0530  PHART 6.857* 7.650* 7.465*  PCO2ART 36.0 19.0* 27.7*  PO2ART 504.0* 177.0* 152*    Liver Enzymes  Recent  Labs Lab 07/30/15 0935 07/30/15 1800  AST 41 37  ALT 21 27  ALKPHOS 124 116  BILITOT 1.6* 1.4*  ALBUMIN 3.2* 2.7*   Cardiac Enzymes  Recent Labs Lab 07/30/15 0935 07/30/15 1800 07/30/15 2235  TROPONINI <0.03 <0.03 <0.03    Glucose  Recent Labs Lab 07/30/15 2239 07/30/15 2359 07/31/15 0056 07/31/15 0158 07/31/15 0243 07/31/15 0345  GLUCAP 238* 200* 189* 156* 130* 130*    Imaging Ct Head Wo Contrast  07/30/2015   CLINICAL DATA:  Found on floor, responsive to pain only.  EXAM: CT HEAD WITHOUT CONTRAST  CT CERVICAL SPINE WITHOUT CONTRAST  TECHNIQUE: Multidetector CT imaging of the head and cervical spine was performed following the standard protocol without intravenous contrast. Multiplanar CT image reconstructions of the cervical spine were  also generated.  COMPARISON:  01/17/2015  FINDINGS: CT HEAD FINDINGS  There is extensive encephalomalacia in the right frontal lobe, right parietal lobe, and right occipital lobe. There is similar but less extensive encephalomalacia in the left parietal and occipital lobes. Chronic ischemic changes in the periventricular white matter are noted. There is no mass effect, midline shift, or acute hemorrhage. These findings are unchanged. Minimal layering mucus in the right maxillary sinus. Paranasal sinuses are otherwise clear. There is fluid in the left mastoid air cells. The cranium is intact.  CT CERVICAL SPINE FINDINGS  Failure of fusion of the posterior C1 arch is a normal variation. There is no fracture or dislocation. There is no obvious soft tissue injury. There is no obvious spinal hematoma. There is a 2.0 x 1.7 cm soft tissue area in the right anterior triangle. Abnormal adenopathy is not excluded.  IMPRESSION: No acute intracranial pathology. Chronic ischemic changes are noted.  Nonspecific fluid in the left mastoid air cells.  No evidence of cervical spine injury.  Possible right cervical adenopathy as described. CT neck with contrast is recommended   Electronically Signed   By: Jolaine Click M.D.   On: 07/30/2015 13:13   Ct Cervical Spine Wo Contrast  07/30/2015   CLINICAL DATA:  Found on floor, responsive to pain only.  EXAM: CT HEAD WITHOUT CONTRAST  CT CERVICAL SPINE WITHOUT CONTRAST  TECHNIQUE: Multidetector CT imaging of the head and cervical spine was performed following the standard protocol without intravenous contrast. Multiplanar CT image reconstructions of the cervical spine were also generated.  COMPARISON:  01/17/2015  FINDINGS: CT HEAD FINDINGS  There is extensive encephalomalacia in the right frontal lobe, right parietal lobe, and right occipital lobe. There is similar but less extensive encephalomalacia in the left parietal and occipital lobes. Chronic ischemic changes in the  periventricular white matter are noted. There is no mass effect, midline shift, or acute hemorrhage. These findings are unchanged. Minimal layering mucus in the right maxillary sinus. Paranasal sinuses are otherwise clear. There is fluid in the left mastoid air cells. The cranium is intact.  CT CERVICAL SPINE FINDINGS  Failure of fusion of the posterior C1 arch is a normal variation. There is no fracture or dislocation. There is no obvious soft tissue injury. There is no obvious spinal hematoma. There is a 2.0 x 1.7 cm soft tissue area in the right anterior triangle. Abnormal adenopathy is not excluded.  IMPRESSION: No acute intracranial pathology. Chronic ischemic changes are noted.  Nonspecific fluid in the left mastoid air cells.  No evidence of cervical spine injury.  Possible right cervical adenopathy as described. CT neck with contrast is recommended   Electronically Signed  By: Jolaine Click M.D.   On: 07/30/2015 13:13   Dg Chest Portable 1 View  07/30/2015   CLINICAL DATA:  Found unresponsive  EXAM: PORTABLE CHEST - 1 VIEW  COMPARISON:  07/01/2015  FINDINGS: Endotracheal and nasogastric tubes are appropriately positioned. Evidence of aortic valvuloplasty. Heart size normal. The lungs are clear.  IMPRESSION: No acute cardiopulmonary process.   Electronically Signed   By: Christiana Pellant M.D.   On: 07/30/2015 10:03     ASSESSMENT / PLAN:  PULMONARY OETT 9/10 >>  A: Acute Respiratory Failure - intubated secondary to AMS, airway protection  P:   MV support, Vt 480 Vent settings adjusted  Wean PEEP/FiO2 for saturations > 92% Intermittent CXR Daily SBT / WUA   CARDIOVASCULAR CVL L Femoral 9/10 >> L Femoral Aline 9/10 >> A:  Shock - rule out septic etiology.  ECHO in 01/2015 with EF 60-65%.  Profound acidosis  Hypothermia - temp on admit 94 Hx CAD s/p CABG w AVR, HTN, HLD, CHF - BNP 398, EKG NSR, neg troponin P:  Goal MAP >65 Wean vasopressors to off with goal as above See ID  NS @  130ml/hr ICU admit / monitoring   RENAL A:   Metabolic Acidosis - AG 10 on presentation, elevated lactic acid.  Found with emesis + incontinent Hyper / Hypokalemia  Hypomagnesemia / Hypophosphatemia  Acute Kidney Injury - resolving with IVF's Rule out Rhabdomyolysis - CK 31 P:   Trend lactic acid Monitor BMP / UOP  Replace electrolytes as indicated, Mg/Phos 9/11  GASTROINTESTINAL A:   Vomiting - prior to admit Incontinence - prior to admit in setting of AMS Hx GERD P:   PPI for SUP NPO OGT Begin TF 9/11  PRN zofran  Mild elevation of T.Bili, AST/ALT wnl  HEMATOLOGIC A:   Anemia  Leukocytosis  Coagulopathy - chronic coumadin therapy P:  Trend CBC  FFP for INR 5.85 9/11 Heparin gtt per pharmacy once INR less than 2  Goal INR 2-3  Assess PT/INR  INFECTIOUS A:   Leukocytosis - initial WBX 43k R/O Septic Shock  P:   BCx2 9/10 >>  UC 9/10 >> neg UA 9/10 >> neg  Sputum 9/10 >>   Vanco (empiric), start date 9/10, day 2/x Zosyn (empiric), start date 9/10, day 2/x   Assess PCT / lactic acid  Monitor fever curve / WBC Assess UDS / ETOH (<5)   ENDOCRINE A:   DKA  P:   Transition off insulin gtt 9/11 am CBG Q4 Levimir 20 units QD (home dose is 18 units BID) 2 units Q4 for TF coverage   NEUROLOGIC A:   Acute Encephalopathy - in the setting of DKA, metabolic acidosis R/O Neurologic event - CT head / neck negative  P:   RASS goal: 0 Fentanyl gtt PRN versed    FAMILY  - Updates: No family available.    - Inter-disciplinary family meet or Palliative Care meeting due by: 9/18    Canary Brim, NP-C [facility] Pgr: [phone] or if no answer [phone] 07/31/2015, 8:39 AM

## 2015-07-31 NOTE — Progress Notes (Signed)
ANTICOAGULATION CONSULT NOTE - FOLLOW UP  Pharmacy Consult:  Heparin Indication:  History of prosthetic AVR and CVAs  Allergies  Allergen Reactions  . Celebrex [Celecoxib] Rash  . Detrol [Tolterodine] Hives    Patient Measurements: Height = 63 inches Weight = 76 kg Heparin Dosing Weight: 69 kg  Vital Signs: Temp: 100.6 F (38.1 C) (09/11 0800) Temp Source: Core (Comment) (09/11 0800) BP: 136/67 mmHg (09/11 0800) Pulse Rate: 100 (09/11 0800)  Labs:  Recent Labs  07/30/15 0935 07/30/15 0944 07/30/15 1400 07/30/15 1800 07/30/15 2235 07/30/15 2248 07/31/15 0604  HGB 9.6* 12.2  --   --   --   --  8.6*  HCT 34.5* 36.0  --   --   --   --  26.2*  PLT 425*  --   --   --   --   --  206  LABPROT 34.4*  --   --   --   --   --  50.6*  INR 3.50*  --   --   --   --   --  5.85*  CREATININE 2.31* 1.20* 2.02* 1.96*  --  1.58*  --   CKTOTAL  --   --  31*  --   --   --   --   TROPONINI <0.03  --   --  <0.03 <0.03  --   --     Estimated Creatinine Clearance: 39.7 mL/min (by C-G formula based on Cr of 1.58).     Assessment: 54 YOF on Coumadin for history of prosthetic AVR and multiple CVAs.  Head CT negative for bleeding and Pharmacy consulted to initiate IV heparin while Coumadin is on hold.  INR supra-therapeutic on admit and has increased further today.  MD ordered FFP and to recheck INR later today.  Patient's hemoglobin and platelet counts are decreasing though there is no overt bleeding.   Goal of Therapy:  Heparin level 0.3-0.7 units/ml Monitor platelets by anticoagulation protocol: Yes  Vanc trough: 15-20 mcg/mL    Plan:  - INR at 1300 per MD - Daily PT / INR, start IV heparin when < 2 - Continue vanc 750mg  IV Q12H, note due 9/13 - Continue Zosyn 3.375gm IV Q8H, 4 hr infusion - Monitor renal fxn, clinical progress, vanc trough as indicated    Gissel Keilman D. 12-26-1982, PharmD, BCPS Pager:  [phone] 07/31/2015, 9:24 AM

## 2015-07-31 NOTE — Progress Notes (Addendum)
eLink Physician-Brief Progress Note Patient Name: Madison [name] DOB: [birth year] MRN: [id number]   Date of Service  07/31/2015  HPI/Events of Note  ABG on 30%/PRVC 32/TV 480/P 5 = 7.66/18/170  eICU Interventions  Will order:  1. Decrease PRVC rate to 14 and recheck ABG at 5:30 AM. 2. D/C NaHCO3 IV infusion.      Intervention Category Major Interventions: Respiratory failure - evaluation and management;Acid-Base disturbance - evaluation and management  Sommer,Steven Eugene 07/31/2015, 4:14 AM

## 2015-08-01 ENCOUNTER — Ambulatory Visit (HOSPITAL_COMMUNITY): Payer: Medicaid Other

## 2015-08-01 ENCOUNTER — Inpatient Hospital Stay (HOSPITAL_COMMUNITY): Payer: Commercial Managed Care - HMO

## 2015-08-01 DIAGNOSIS — R509 Fever, unspecified: Secondary | ICD-10-CM

## 2015-08-01 LAB — PREPARE FRESH FROZEN PLASMA
UNIT DIVISION: 0
Unit division: 0

## 2015-08-01 LAB — BASIC METABOLIC PANEL
ANION GAP: 7 (ref 5–15)
BUN: 10 mg/dL (ref 6–20)
CALCIUM: 8.2 mg/dL — AB (ref 8.9–10.3)
CO2: 23 mmol/L (ref 22–32)
Chloride: 113 mmol/L — ABNORMAL HIGH (ref 101–111)
Creatinine, Ser: 0.87 mg/dL (ref 0.44–1.00)
GFR calc Af Amer: >60 mL/min (ref >60)
GFR calc non Af Amer: >60 mL/min (ref >60)
GLUCOSE: 151 mg/dL — AB (ref 65–99)
Potassium: 3.3 mmol/L — ABNORMAL LOW (ref 3.5–5.1)
Sodium: 143 mmol/L (ref 135–145)

## 2015-08-01 LAB — GLUCOSE, CAPILLARY
GLUCOSE-CAPILLARY: 77 mg/dL (ref 65–99)
GLUCOSE-CAPILLARY: 243 mg/dL — AB (ref 65–99)
GLUCOSE-CAPILLARY: 221 mg/dL — AB (ref 65–99)
GLUCOSE-CAPILLARY: 174 mg/dL — AB (ref 65–99)
GLUCOSE-CAPILLARY: 188 mg/dL — AB (ref 65–99)
Glucose-Capillary: 148 mg/dL — ABNORMAL HIGH (ref 65–99)

## 2015-08-01 LAB — PROTIME-INR
INR: 2.75 — ABNORMAL HIGH (ref 0.00–1.49)
PROTHROMBIN TIME: 28.7 s — AB (ref 11.6–15.2)

## 2015-08-01 LAB — PROCALCITONIN: Procalcitonin: 5.44 ng/mL

## 2015-08-01 MED ORDER — PRO-STAT SUGAR FREE PO LIQD
30.0000 mL | Freq: Two times a day (BID) | ORAL | Status: DC
  Administered 2015-08-01 – 2015-08-04 (×7): 30 mL
  Filled 2015-08-01 (×10): qty 30

## 2015-08-01 MED ORDER — POTASSIUM CHLORIDE 20 MEQ/15ML (10%) PO SOLN
40.0000 meq | Freq: Two times a day (BID) | ORAL | Status: DC
  Administered 2015-08-01 – 2015-08-04 (×8): 40 meq
  Filled 2015-08-01 (×12): qty 30

## 2015-08-01 MED ORDER — VITAL HIGH PROTEIN PO LIQD
1000.0000 mL | ORAL | Status: DC
  Administered 2015-08-01 – 2015-08-02 (×2): 1000 mL
  Administered 2015-08-02: 20:00:00
  Administered 2015-08-03: 1000 mL
  Filled 2015-08-01 (×5): qty 1000

## 2015-08-01 NOTE — Progress Notes (Signed)
Dr. Isaiah Serge notified of patient's potassium level of 3.3 during board rounds. No orders received.

## 2015-08-01 NOTE — Progress Notes (Signed)
PULMONARY / CRITICAL CARE MEDICINE   Name: Madison [name] MRN: [id number] DOB: Aug 30, 1961    ADMISSION DATE:  07/30/2015 CONSULTATION DATE:  07/30/15  REFERRING MD :  Dr. Ethelda Chick  CHIEF COMPLAINT:  Hypotension / AMS   INITIAL PRESENTATION: 54 y/o F admitted on 9/10 after being found altered at home, incontinent of bowel/bladder, emesis and altered.  Recent admission for LLE fracture with d/c to SNF.  Found at home by [facility].    STUDIES:  9/10  CT Head >> no acute pathology, chronic ischemic changes, possible R cervical adenopathy 9/10  CT Neck >> no cervical spine injury  SIGNIFICANT EVENTS: 9/10  Admit after being found altered at home.  Concern for DKA & possible sepsis    SUBJECTIVE: More awake today. Did not do well on the weaning trial.  VITAL SIGNS: Temp:  [99.5 F (37.5 C)-100.8 F (38.2 C)] 100.4 F (38 C) (09/12 1200) Pulse Rate:  [93-128] 99 (09/12 1200) Resp:  [11-31] 24 (09/12 1200) BP: (83-160)/(44-67) 123/59 mmHg (09/12 1200) SpO2:  [100 %] 100 % (09/12 1200) Arterial Line BP: (104-167)/(47-67) 135/55 mmHg (09/12 1200) FiO2 (%):  [30 %] 30 % (09/12 1114) Weight:  [171 lb 8.3 oz (77.8 kg)] 171 lb 8.3 oz (77.8 kg) (09/12 0311)   HEMODYNAMICS:     VENTILATOR SETTINGS: Vent Mode:  [-] PRVC FiO2 (%):  [30 %] 30 % Set Rate:  [14 bmp] 14 bmp Vt Set:  [480 mL] 480 mL PEEP:  [5 cmH20] 5 cmH20 Plateau Pressure:  [15 cmH20-19 cmH20] 16 cmH20   INTAKE / OUTPUT:  Intake/Output Summary (Last 24 hours) at 08/01/15 1236 Last data filed at 08/01/15 1200  Gross per 24 hour  Intake 3264.99 ml  Output   1660 ml  Net 1604.99 ml    PHYSICAL EXAMINATION: General:  Chronically ill appearing female on vent Neuro:  Opens eyes to command.  HEENT:  Moist mucus membranes Cardiovascular:  S1, S2, RRR Lungs:  Clear antr Abdomen: Soft, + BS Musculoskeletal:  No acute deformities, LLE in walking boot Skin:  No edema  LABS:  CBC  Recent Labs Lab 07/30/15 0935  07/30/15 0944 07/31/15 0604  WBC 43.0*  --  23.4*  HGB 9.6* 12.2 8.6*  HCT 34.5* 36.0 26.2*  PLT 425*  --  206   Coag's  Recent Labs Lab 07/31/15 0604 07/31/15 1300 08/01/15 0530  INR 5.85* 3.10* 2.75*    BMET  Recent Labs Lab 07/31/15 0515 07/31/15 0930 08/01/15 0530  NA 145 147* 143  K 3.6 3.5 3.3*  CL 117* 118* 113*  CO2 19* 20* 23  BUN 20 18 10   CREATININE 1.25* 1.19* 0.87  GLUCOSE 114* 131* 151*   Electrolytes  Recent Labs Lab 07/30/15 0935  07/31/15 0515 07/31/15 0930 08/01/15 0530  CALCIUM 9.0  < > 8.2* 8.3* 8.2*  MG 3.2*  --  1.5*  --   --   PHOS  --   --  <1.0*  --   --   < > = values in this interval not displayed. Sepsis Markers  Recent Labs Lab 07/30/15 0945 07/30/15 1400 07/30/15 1821 07/31/15 0515 08/01/15 0530  LATICACIDVEN 8.65*  --  4.4*  --   --   PROCALCITON  --  7.08  --  11.66 5.44   ABG  Recent Labs Lab 07/30/15 1125 07/31/15 0406 07/31/15 0530  PHART 6.857* 7.650* 7.465*  PCO2ART 36.0 19.0* 27.7*  PO2ART 504.0* 177.0* 152*    Liver Enzymes  Recent Labs Lab 07/30/15 0935 07/30/15 1800  AST 41 37  ALT 21 27  ALKPHOS 124 116  BILITOT 1.6* 1.4*  ALBUMIN 3.2* 2.7*   Cardiac Enzymes  Recent Labs Lab 07/30/15 0935 07/30/15 1800 07/30/15 2235  TROPONINI <0.03 <0.03 <0.03    Glucose  Recent Labs Lab 07/31/15 1605 07/31/15 2029 08/01/15 0032 08/01/15 0412 08/01/15 0736 08/01/15 1154  GLUCAP 320* 241* 148* 77 243* 221*    Imaging No results found.   ASSESSMENT / PLAN:  PULMONARY OETT 9/10 >>  A: Acute Respiratory Failure - intubated secondary to AMS, airway protection  P:   MV support, Vt 480 Vent settings adjusted  Intermittent CXR Daily SBT / WUA   CARDIOVASCULAR CVL L Femoral 9/10 >> L Femoral Aline 9/10 >> A:  Shock - rule out septic etiology.  ECHO in 01/2015 with EF 60-65%.  Profound acidosis  Hypothermia - temp on admit 94 Hx CAD s/p CABG w AVR, HTN, HLD, CHF - BNP 398, EKG  NSR, neg troponin P:  Goal MAP >65 Off pressors D/C IVF See ID    RENAL A:   Metabolic Acidosis - AG 10 on presentation, elevated lactic acid.  Found with emesis + incontinent Hyper / Hypokalemia  Hypomagnesemia / Hypophosphatemia  Acute Kidney Injury - resolving with IVF's Rule out Rhabdomyolysis - CK 31 P:   Trend lactic acid Monitor BMP / UOP  Replace K as indicated.  GASTROINTESTINAL A:   Vomiting - prior to admit Incontinence - prior to admit in setting of AMS Hx GERD P:   PPI for SUP NPO OGT Begin TF 9/11  PRN zofran  Mild elevation of T.Bili, AST/ALT wnl  HEMATOLOGIC A:   Anemia  Leukocytosis  Coagulopathy - chronic coumadin therapy P:  Trend CBC  FFP for INR 5.85 9/11 Heparin gtt per pharmacy once INR less than 2  Off coumadin.  Goal INR 2-3  Assess PT/INR  INFECTIOUS A:   Leukocytosis - initial WBX 43k R/O Septic Shock  P:   BCx2 9/10 >>  UC 9/10 >> neg UA 9/10 >> neg  Sputum 9/10 >>   Vanco (empiric), start date 9/10, day 3/x Zosyn (empiric), start date 9/10, day 3/x   Assess PCT / lactic acid  Monitor fever curve / WBC Assess UDS / ETOH (<5)   ENDOCRINE A:   DKA  P:   Transition off insulin gtt 9/11 am CBG Q4 Levimir 20 units QD (home dose is 18 units BID) 2 units Q4 for TF coverage   NEUROLOGIC A:   Acute Encephalopathy - in the setting of DKA, metabolic acidosis R/O Neurologic event - CT head / neck negative  P:   RASS goal: 0 Fentanyl gtt PRN versed    FAMILY  - Updates: No family available.   - Inter-disciplinary family meet or Palliative Care meeting due by: 9/18.  Critical care time- 40 mins  Chilton Greathouse MD [facility] Pager [phone] If no answer or after 3pm call: [phone] 08/01/2015, 12:36 PM

## 2015-08-01 NOTE — Clinical Documentation Improvement (Signed)
Critical Care  Abnormal Lab/Test Results:   9/10:  Sodium: 127.  Possible Clinical Conditions associated with below indicators  Hyponatremia  Other Condition  Cannot Clinically Determine   Treatment Provided: 9/11:  0.9% NaCl @ 50 cc/hr.   Please exercise your independent, professional judgment when responding. A specific answer is not anticipated or expected.   Thank Sabino Donovan [facility] [place] [phone]

## 2015-08-01 NOTE — Progress Notes (Signed)
Initial Nutrition Assessment  DOCUMENTATION CODES:   Obesity unspecified  INTERVENTION:    Continue TF via OGT with Vital High Protein, decrease goal rate to 35 ml/h (840 ml per day) with Prostat 30 ml BID to provide 1040 kcals, 104 gm protein, 702 ml free water daily.  NUTRITION DIAGNOSIS:   Inadequate oral intake related to inability to eat as evidenced by NPO status.  GOAL:   Patient will meet greater than or equal to 90% of their needs  MONITOR:   Labs, Weight trends, TF tolerance, Vent status  REASON FOR ASSESSMENT:   Consult Enteral/tube feeding initiation and management  ASSESSMENT:   53 y/o F admitted on 9/10 after being found altered at home. In severe septic shock of unclear source.  Discussed patient in ICU rounds and with RN today. TF was initiated yesterday with adult TF protocol. Patient is currently receiving Vital High Protein via OGT at 40 ml/h (960 ml/day) to provide 960 kcals, 84 gm protein, 803 ml free water daily.   Patient is currently intubated on ventilator support MV: 12.6 L/min Temp (24hrs), Avg:100 F (37.8 C), Min:99.5 F (37.5 C), Max:100.8 F (38.2 C)   Diet Order:  Diet NPO time specified  Skin:  Reviewed, no issues  Last BM:  unknown  Height:   Ht Readings from Last 1 Encounters:  08/01/15 5\' 3"  (1.6 m)    Weight:   Wt Readings from Last 1 Encounters:  08/01/15 171 lb 8.3 oz (77.8 kg)    Ideal Body Weight:  52.3 kg  BMI:  Body mass index is 30.39 kg/(m^2).  Estimated Nutritional Needs:   Kcal:  10/01/15  Protein:  105 gm  Fluid:  1.5 L  EDUCATION NEEDS:   No education needs identified at this time  [phone], RD, LDN, CNSC Pager [phone] After Hours Pager [phone]

## 2015-08-01 NOTE — Progress Notes (Signed)
  Echocardiogram 2D Echocardiogram has been performed.  Leta Jungling M 08/01/2015, 12:01 PM

## 2015-08-01 NOTE — Clinical Documentation Improvement (Signed)
Critical Care  Can the diagnosis of CHF be further specified?    Acuity - Acute, Chronic, Acute on Chronic   Type--Diastolic, Systolic, Combined Diastolic and Systolic  Other  Clinically Undetermined   Document any associated diagnoses/conditions   Supporting Information: Patient with a history of CHF per 9/10 progress notes.  Labs: 9/10: BNP: 398.0.   Please exercise your independent, professional judgment when responding. A specific answer is not anticipated or expected.   Thank Sabino Donovan [facility] [place] [phone]

## 2015-08-02 ENCOUNTER — Inpatient Hospital Stay (HOSPITAL_COMMUNITY): Payer: Commercial Managed Care - HMO

## 2015-08-02 DIAGNOSIS — L8990 Pressure ulcer of unspecified site, unspecified stage: Secondary | ICD-10-CM | POA: Insufficient documentation

## 2015-08-02 LAB — CBC
HEMATOCRIT: 23 % — AB (ref 36.0–46.0)
HEMOGLOBIN: 7.2 g/dL — AB (ref 12.0–15.0)
MCH: 25.2 pg — AB (ref 26.0–34.0)
MCHC: 31.3 g/dL (ref 30.0–36.0)
MCV: 80.4 fL (ref 78.0–100.0)
Platelets: 120 10*3/uL — ABNORMAL LOW (ref 150–400)
RBC: 2.86 MIL/uL — ABNORMAL LOW (ref 3.87–5.11)
RDW: 22 % — AB (ref 11.5–15.5)
WBC: 9 10*3/uL (ref 4.0–10.5)

## 2015-08-02 LAB — GLUCOSE, CAPILLARY
GLUCOSE-CAPILLARY: 162 mg/dL — AB (ref 65–99)
GLUCOSE-CAPILLARY: 345 mg/dL — AB (ref 65–99)
GLUCOSE-CAPILLARY: 271 mg/dL — AB (ref 65–99)
GLUCOSE-CAPILLARY: 116 mg/dL — AB (ref 65–99)
Glucose-Capillary: 217 mg/dL — ABNORMAL HIGH (ref 65–99)
Glucose-Capillary: 244 mg/dL — ABNORMAL HIGH (ref 65–99)

## 2015-08-02 LAB — BASIC METABOLIC PANEL
ANION GAP: 4 — AB (ref 5–15)
BUN: 18 mg/dL (ref 6–20)
CO2: 25 mmol/L (ref 22–32)
Calcium: 8.2 mg/dL — ABNORMAL LOW (ref 8.9–10.3)
Chloride: 112 mmol/L — ABNORMAL HIGH (ref 101–111)
Creatinine, Ser: 0.7 mg/dL (ref 0.44–1.00)
GFR calc Af Amer: >60 mL/min (ref >60)
GFR calc non Af Amer: >60 mL/min (ref >60)
GLUCOSE: 220 mg/dL — AB (ref 65–99)
POTASSIUM: 4.1 mmol/L (ref 3.5–5.1)
Sodium: 141 mmol/L (ref 135–145)

## 2015-08-02 LAB — HEPARIN LEVEL (UNFRACTIONATED): Heparin Unfractionated: <0.1 IU/mL — ABNORMAL LOW (ref 0.30–0.70)

## 2015-08-02 LAB — MAGNESIUM: Magnesium: 2 mg/dL (ref 1.7–2.4)

## 2015-08-02 LAB — PHOSPHORUS: Phosphorus: 1.5 mg/dL — ABNORMAL LOW (ref 2.5–4.6)

## 2015-08-02 LAB — PROTIME-INR
INR: 2.32 — AB (ref 0.00–1.49)
Prothrombin Time: 25.2 seconds — ABNORMAL HIGH (ref 11.6–15.2)

## 2015-08-02 MED ORDER — INSULIN DETEMIR 100 UNIT/ML ~~LOC~~ SOLN
15.0000 [IU] | Freq: Two times a day (BID) | SUBCUTANEOUS | Status: DC
Start: 2015-08-02 — End: 2015-08-02
  Filled 2015-08-02: qty 0.15

## 2015-08-02 MED ORDER — INSULIN DETEMIR 100 UNIT/ML ~~LOC~~ SOLN
20.0000 [IU] | Freq: Every day | SUBCUTANEOUS | Status: DC
  Administered 2015-08-02 – 2015-08-05 (×4): 20 [IU] via SUBCUTANEOUS
  Filled 2015-08-02 (×5): qty 0.2

## 2015-08-02 MED ORDER — HEPARIN (PORCINE) IN NACL 100-0.45 UNIT/ML-% IJ SOLN
2000.0000 [IU]/h | INTRAMUSCULAR | Status: DC
  Administered 2015-08-02: 1050 [IU]/h via INTRAVENOUS
  Administered 2015-08-04: 2000 [IU]/h via INTRAVENOUS
  Filled 2015-08-02 (×6): qty 250

## 2015-08-02 MED ORDER — FUROSEMIDE 10 MG/ML IJ SOLN
40.0000 mg | Freq: Once | INTRAMUSCULAR | Status: AC
  Administered 2015-08-02: 40 mg via INTRAVENOUS
  Filled 2015-08-02: qty 4

## 2015-08-02 MED ORDER — SODIUM PHOSPHATE 3 MMOLE/ML IV SOLN
30.0000 mmol | Freq: Once | INTRAVENOUS | Status: AC
  Administered 2015-08-02: 30 mmol via INTRAVENOUS
  Filled 2015-08-02: qty 10

## 2015-08-02 MED ORDER — VANCOMYCIN HCL IN DEXTROSE 1-5 GM/200ML-% IV SOLN
1000.0000 mg | Freq: Two times a day (BID) | INTRAVENOUS | Status: DC
  Administered 2015-08-02 – 2015-08-03 (×3): 1000 mg via INTRAVENOUS
  Filled 2015-08-02 (×5): qty 200

## 2015-08-02 MED ORDER — INSULIN ASPART 100 UNIT/ML ~~LOC~~ SOLN
4.0000 [IU] | SUBCUTANEOUS | Status: DC
  Administered 2015-08-02 – 2015-08-04 (×9): 4 [IU] via SUBCUTANEOUS

## 2015-08-02 MED ORDER — HEPARIN BOLUS VIA INFUSION
2000.0000 [IU] | Freq: Once | INTRAVENOUS | Status: AC
  Administered 2015-08-02: 2000 [IU] via INTRAVENOUS
  Filled 2015-08-02: qty 2000

## 2015-08-02 NOTE — Progress Notes (Signed)
Results for JENELLE, DRENNON (MRN [id number]) as of 08/02/2015 09:53  Ref. Range 08/01/2015 15:30 08/01/2015 19:42 08/01/2015 23:48 08/02/2015 03:59 08/02/2015 07:47  Glucose-Capillary Latest Ref Range: 65-99 mg/dL 409 (H) 811 (H) 914 (H) 162 (H) 244 (H)  CBGs continue to be greater than 180 mg/dl. Current medications: Levemir 20 units daily, Novolog MODERATE correction scale every 4 hours, Novolog 2 units every 4 hours.  Recommend increasing Novolog meal coverage to 3-4 units every 4 hours if CBGs continue to be greater than 180 mg/dl. Will continue to follow while in hospital. Smith Mince RN BSN CDE

## 2015-08-02 NOTE — Progress Notes (Addendum)
PULMONARY / CRITICAL CARE MEDICINE   Name: Madison [name] MRN: [id number] DOB: Oct 05, 1961    ADMISSION DATE:  07/30/2015 CONSULTATION DATE:  07/30/15  REFERRING MD :  Dr. Ethelda Chick  CHIEF COMPLAINT:  Hypotension / AMS   INITIAL PRESENTATION: 54 y/o F admitted on 9/10 after being found altered at home, incontinent of bowel/bladder, emesis and altered.  Recent admission for LLE fracture with d/c to SNF.  Found at home by [facility]    STUDIES:  9/10  CT Head >> no acute pathology, chronic ischemic changes, possible R cervical adenopathy 9/10  CT Neck >> no cervical spine injury  SIGNIFICANT EVENTS: 9/10  Admit after being found altered at home.  Concern for DKA & possible sepsis   SUBJECTIVE: More awake today. Did not do well on the weaning trial.  VITAL SIGNS: Temp:  [99.5 F (37.5 C)-100.8 F (38.2 C)] 99.8 F (37.7 C) (09/13 0900) Pulse Rate:  [76-101] 79 (09/13 0900) Resp:  [14-24] 18 (09/13 0900) BP: (112-172)/(50-68) 115/59 mmHg (09/13 0800) SpO2:  [100 %] 100 % (09/13 0900) Arterial Line BP: (122-174)/(53-81) 163/64 mmHg (09/13 0900) FiO2 (%):  [30 %] 30 % (09/13 0900) Weight:  [172 lb 9.9 oz (78.3 kg)] 172 lb 9.9 oz (78.3 kg) (09/13 0500)   HEMODYNAMICS:     VENTILATOR SETTINGS: Vent Mode:  [-] PRVC FiO2 (%):  [30 %] 30 % Set Rate:  [14 bmp] 14 bmp Vt Set:  [480 mL] 480 mL PEEP:  [5 cmH20] 5 cmH20 Plateau Pressure:  [16 cmH20-19 cmH20] 18 cmH20   INTAKE / OUTPUT:  Intake/Output Summary (Last 24 hours) at 08/02/15 0957 Last data filed at 08/02/15 0900  Gross per 24 hour  Intake 1776.75 ml  Output   1180 ml  Net 596.75 ml    PHYSICAL EXAMINATION: General:  Chronically ill appearing female on vent Neuro:  Opens eyes to command.  HEENT:  Moist mucus membranes Cardiovascular:  S1, S2, RRR Lungs:  Clear antr Abdomen: Soft, + BS Musculoskeletal:  No acute deformities, LLE in walking boot Skin:  No edema  LABS:  CBC  Recent Labs Lab 07/30/15 0935  07/30/15 0944 07/31/15 0604 08/02/15 0424  WBC 43.0*  --  23.4* 9.0  HGB 9.6* 12.2 8.6* 7.2*  HCT 34.5* 36.0 26.2* 23.0*  PLT 425*  --  206 120*   Coag's  Recent Labs Lab 07/31/15 1300 08/01/15 0530 08/02/15 0424  INR 3.10* 2.75* 2.32*    BMET  Recent Labs Lab 07/31/15 0930 08/01/15 0530 08/02/15 0424  NA 147* 143 141  K 3.5 3.3* 4.1  CL 118* 113* 112*  CO2 20* 23 25  BUN 18 10 18   CREATININE 1.19* 0.87 0.70  GLUCOSE 131* 151* 220*   Electrolytes  Recent Labs Lab 07/30/15 0935  07/31/15 0515 07/31/15 0930 08/01/15 0530 08/02/15 0424  CALCIUM 9.0  < > 8.2* 8.3* 8.2* 8.2*  MG 3.2*  --  1.5*  --   --  2.0  PHOS  --   --  <1.0*  --   --  1.5*  < > = values in this interval not displayed. Sepsis Markers  Recent Labs Lab 07/30/15 0945 07/30/15 1400 07/30/15 1821 07/31/15 0515 08/01/15 0530  LATICACIDVEN 8.65*  --  4.4*  --   --   PROCALCITON  --  7.08  --  11.66 5.44   ABG  Recent Labs Lab 07/30/15 1125 07/31/15 0406 07/31/15 0530  PHART 6.857* 7.650* 7.465*  PCO2ART 36.0 19.0* 27.7*  PO2ART 504.0* 177.0* 152*    Liver Enzymes  Recent Labs Lab 07/30/15 0935 07/30/15 1800  AST 41 37  ALT 21 27  ALKPHOS 124 116  BILITOT 1.6* 1.4*  ALBUMIN 3.2* 2.7*   Cardiac Enzymes  Recent Labs Lab 07/30/15 0935 07/30/15 1800 07/30/15 2235  TROPONINI <0.03 <0.03 <0.03    Glucose  Recent Labs Lab 08/01/15 1154 08/01/15 1530 08/01/15 1942 08/01/15 2348 08/02/15 0359 08/02/15 0747  GLUCAP 221* 174* 188* 217* 162* 244*    Imaging Dg Chest Port 1 View  08/02/2015   CLINICAL DATA:  Advancement of the endotracheal tube  EXAM: PORTABLE CHEST - 1 VIEW  COMPARISON:  08/02/2015  FINDINGS: Endotracheal tube is near the carina, approximately 1 cm above the carina. There is left base atelectasis, stable. Right lung is clear. Heart is normal size. Prior median sternotomy and valve replacement. No visible effusions or acute bony abnormality.   IMPRESSION: Endotracheal tube near the carina, approximately 1 cm above the carina.  Left base atelectasis.   Electronically Signed   By: Charlett Nose M.D.   On: 08/02/2015 08:55   Dg Chest Port 1 View  08/02/2015   CLINICAL DATA:  Acute respiratory failure.  EXAM: PORTABLE CHEST - 1 VIEW  COMPARISON:  08/01/2015.  FINDINGS: Endotracheal tube noted with tip 1.3 cm above the carina. Proximal repositioning of 1-2 cm suggested. NG tube in stable position. Heart size normal. Aortic valve replacement. Left lower lobe subsegmental atelectasis and or mild infiltrate. No pleural effusion or pneumothorax.  IMPRESSION: 1. Endotracheal tube tip noted 1.3 cm above the carina. Proximal repositioning of 1 of 2 cm suggested. NG tube in stable position. 2. Mild left lower lobe subsegmental atelectasis and or infiltrate. 3. Prior cardiac valve replacement.  Heart size normal. These results will be called to the ordering clinician or representative by the Radiologist Assistant, and communication documented in the PACS or zVision Dashboard.   Electronically Signed   By: Maisie Fus  Register   On: 08/02/2015 07:17   Dg Chest Port 1 View  08/01/2015   CLINICAL DATA:  Respiratory failure.  EXAM: PORTABLE CHEST - 1 VIEW  COMPARISON:  07/31/2015.  FINDINGS: The cardiac silhouette, mediastinal and hilar contours are within normal limits and stable. The endotracheal tube and NG tubes are stable. The lungs are clear. No pleural effusion or pneumothorax.  IMPRESSION: Stable support apparatus.  No acute pulmonary findings.   Electronically Signed   By: Rudie Meyer M.D.   On: 08/01/2015 12:43     ASSESSMENT / PLAN:  PULMONARY OETT 9/10 >>  A: Acute Respiratory Failure - intubated secondary to AMS, airway protection  P:   MV support, Vt 480 Vent settings adjusted  Intermittent CXR Daily SBT / WUA   CARDIOVASCULAR CVL L Femoral 9/10 >> L Femoral Aline 9/10 >> A:  Shock - rule out septic etiology.  ECHO in 01/2015 with EF  60-65%.  Profound acidosis  Hypothermia - temp on admit 94 Hx CAD s/p CABG w AVR, HTN, HLD, CHF - BNP 398, EKG NSR, neg troponin. Mechanical AV valve. P:  Goal MAP >65 Off pressors D/C IVF See ID    RENAL A:   Metabolic Acidosis - AG 10 on presentation, elevated lactic acid.  Found with emesis + incontinent Hyper / Hypokalemia  Hypomagnesemia / Hypophosphatemia  Acute Kidney Injury - resolving with IVF's Rule out Rhabdomyolysis - CK 31 P:   Trend lactic acid Monitor BMP / UOP  Replace K as indicated.  GASTROINTESTINAL A:   Vomiting - prior to admit Incontinence - prior to admit in setting of AMS Hx GERD P:   PPI for SUP NPO OGT Begin TF 9/11  PRN zofran  Mild elevation of T.Bili, AST/ALT wnl  HEMATOLOGIC A:   Anemia  Leukocytosis  Coagulopathy - chronic coumadin therapy for mechanical AV valve. P:  Trend CBC  FFP for INR 5.85 9/11 Heparin gtt per pharmacy once INR less than 2.5. Low Hb noted but no evidence of bleed. Will continue to monitor. Off coumadin.  Assess PT/INR  INFECTIOUS A:   Leukocytosis - initial WBX 43k R/O Septic Shock  P:   BCx2 9/10 >>  UC 9/10 >> neg UA 9/10 >> neg  Sputum 9/10 >>   Vanco (empiric), start date 9/10, day 4/x Zosyn (empiric), start date 9/10, day 4/x   WBC count has normalized. Pct was elevated but improving. Still cannot identify the source of infection. Will continue empiric abx for now. Consider narrowing tomorrow.   ENDOCRINE A:   DKA  P:   Transition off insulin gtt 9/11 am CBG Q4 Levimir 20 units QD (home dose is 18 units BID) 4 units Q4 for TF coverage   NEUROLOGIC A:   Acute Encephalopathy - in the setting of DKA, metabolic acidosis R/O Neurologic event - CT head / neck negative  P:   RASS goal: 0 Fentanyl gtt PRN versed   FAMILY  - Updates: No family available.   - Inter-disciplinary family meet or Palliative Care meeting due by: 9/18.  Critical care time- 40 mins  Chilton Greathouse  MD [place] Pager [phone] If no answer or after 3pm call: [phone] 08/02/2015, 9:57 AM

## 2015-08-02 NOTE — Progress Notes (Addendum)
ANTICOAGULATION & ANTIBIOTIC CONSULT NOTE - Initial Consult  Pharmacy Consult for Heparin; Vancomycin/Zosyn Indication: Aortic Valve Replacement; Sepsis   Allergies  Allergen Reactions  . Celebrex [Celecoxib] Rash  . Detrol [Tolterodine] Hives    Patient Measurements: Height: 5\' 3"  (160 cm) Weight: 172 lb 9.9 oz (78.3 kg) IBW/kg (Calculated) : 52.4 Heparin Dosing Weight: 70 kg   Vital Signs: Temp: 99.8 F (37.7 C) (09/13 0900) Temp Source: Core (Comment) (09/13 0900) BP: 115/59 mmHg (09/13 0800) Pulse Rate: 79 (09/13 0900)  Labs:  Recent Labs  07/30/15 0935 07/30/15 0944 07/30/15 1400 07/30/15 1800 07/30/15 2235  07/31/15 0515 07/31/15 0604 07/31/15 0930 07/31/15 1300 08/01/15 0530 08/02/15 0424  HGB 9.6* 12.2  --   --   --   --   --  8.6*  --   --   --  7.2*  HCT 34.5* 36.0  --   --   --   --   --  26.2*  --   --   --  23.0*  PLT 425*  --   --   --   --   --   --  206  --   --   --  120*  LABPROT 34.4*  --   --   --   --   --   --  50.6*  --  31.4* 28.7* 25.2*  INR 3.50*  --   --   --   --   --   --  5.85*  --  3.10* 2.75* 2.32*  CREATININE 2.31* 1.20* 2.02* 1.96*  --   < > 1.25*  --  1.19*  --  0.87 0.70  CKTOTAL  --   --  31*  --   --   --  128  --   --   --   --   --   TROPONINI <0.03  --   --  <0.03 <0.03  --   --   --   --   --   --   --   < > = values in this interval not displayed.  Estimated Creatinine Clearance: 79.7 mL/min (by C-G formula based on Cr of 0.7).   Medical History: Past Medical History  Diagnosis Date  . CAD (coronary artery disease)   . Obesity   . Hypercholesteremia   . HTN (hypertension)   . Dyslipidemia   . Aortic stenosis   . Thrombophlebitis   . Heart murmur   . Myocardial infarction 12/2014  . Pneumonia 11/2014; 12/2014  . GERD (gastroesophageal reflux disease)   . 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 Anxiety   . Depression   . Allergy   . CHF  (congestive heart failure)   . Rheumatoid arthritis(714.0)     "hands" (07/01/2015)  . Diabetes dx'd 1982    "went straight to insulin"    Medications:  Prescriptions prior to admission  Medication Sig Dispense Refill Last Dose  . aspirin 81 MG tablet Take 1 tablet (81 mg total) by mouth daily. 30 tablet  unknown at unknown  . baclofen (LIORESAL) 10 MG tablet TAKE 1 TABLET (10 MG TOTAL) BY MOUTH 2 (TWO) TIMES DAILY. 60 tablet 1 unknown at unknown  . clonazePAM (KLONOPIN) 0.5 MG tablet Take 1 tablet (0.5 mg total) by mouth 2 (two) times daily. 10 tablet 0 unknown at unknown  . escitalopram (LEXAPRO) 20 MG tablet  Take 20 mg by mouth daily.   unknown at unknown  . HYDROcodone-acetaminophen (NORCO) 7.5-325 MG per tablet Take 1-2 tablets by mouth every 6 (six) hours as needed for moderate pain. 90 tablet 0 unknown at unknown  . insulin aspart (NOVOLOG) 100 UNIT/ML injection Inject 0-15 Units into the skin 3 (three) times daily with meals. 10 mL 11 unknown  . insulin detemir (LEVEMIR) 100 UNIT/ML injection Inject 0.18 mLs (18 Units total) into the skin 2 (two) times daily. 10 mL 11 unknown  . iron polysaccharides (NIFEREX) 150 MG capsule Take 150 mg by mouth daily.   unknown at unknown  . lisinopril (PRINIVIL,ZESTRIL) 2.5 MG tablet TAKE 1 TABLET BY MOUTH EVERY DAY 30 tablet 5 unknown at unknown  . metoprolol tartrate (LOPRESSOR) 25 MG tablet Take 12.5 mg by mouth 2 (two) times daily.   unknown at unknown  . omeprazole (PRILOSEC) 40 MG capsule TAKE 1 CAPSULE (40 MG TOTAL) BY MOUTH DAILY. 30 capsule 6 unknown at unknown  . pravastatin (PRAVACHOL) 20 MG tablet TAKE 1 TABLET BY MOUTH EVERY DAY 90 tablet 0 unknown at Unknown time  . RHEUMATREX 2.5 MG tablet TAKE 1 TABLET 2 TIMES A WEEK (Patient taking differently: TAKE 1 TABLET 2 TIMES A WEEK (MON AND FRI)) 24 tablet 1 unknown at unknown  . solifenacin (VESICARE) 5 MG tablet Take 1 tablet (5 mg total) by mouth daily. 90 tablet 2 unknown at unknown  .  traZODone (DESYREL) 50 MG tablet Take 0.5-1 tablets (25-50 mg total) by mouth at bedtime as needed for sleep. 10 tablet 0 unknown at unknown  . warfarin (COUMADIN) 5 MG tablet Take 5 mg by mouth daily.   unknown at unknown  . warfarin (COUMADIN) 7.5 MG tablet Take 7.5 mg by mouth See admin instructions. Only take 7.5mg  on wednesdays per [facility]   unknown at unknown  . zolpidem (AMBIEN) 10 MG tablet Take 10 mg by mouth at bedtime as needed. for sleep  1 unknown at unknown  . clopidogrel (PLAVIX) 75 MG tablet Take 75 mg by mouth daily.  2 Not Taking at Unknown time  . glucagon (GLUCAGON EMERGENCY) 1 MG injection Inject 1 mg into the vein once as needed. (Patient taking differently: Inject 1 mg into the vein daily as needed. ) 1 each 5 unknown at unknown  . metoprolol tartrate (LOPRESSOR) 25 MG tablet Take 0.5 tablets (12.5 mg total) by mouth 2 (two) times daily. (Patient not taking: Reported on 08/01/2015) 60 tablet 0 Not Taking at Unknown time  . traMADol (ULTRAM) 50 MG tablet Take 50 mg by mouth at bedtime as needed.  0 Not Taking at Unknown time  . warfarin (COUMADIN) 5 MG tablet TAKE 1-1.5 BY MOUTH DAILY AT 6PM. TAKE 1 TAB DAILY TUES,TH,F,SAT & SUN. TAKE 1& 1/2 TAB ON MON &WED (Patient not taking: Reported on 08/01/2015) 60 tablet 0 Not Taking at Unknown time    Assessment: 78 YOF admitted on 9/10 after being found down at home. She has a history of Aortic Valve replacement and was on Coumadin at home with an INR goal of 2.5-3.5.   AC: INR on admission was 3.5 and it trended up to 5.85 at which point the patient received 3 units of FFP. INR today is down to 2.32 and CCM is agreeable to resume IV heparin. H/H down to 7.2, Plt down to 120 today. This could be partly dilutional as patient is +10 L since admission.   ID: Currently on D#4 of Vancomycin/Zosyn for  sepsis with recent admission for LLE fracture and discharged to SNF. Tmax 99.73F, WBC down to 9, LA 4.4, PCT 5.44. A Vanc trough was supposed to be  colleted today but yesterday's 11 AM bag was accidentally not hung per RN. Will postpone collecting Vanc level. CrCl has improved to 80 mL/min.   Vanc 9/10 >> Zosyn 9/10 >>  9/10 UCx (1415) - NEGF 9/10 UCx (1020) - negative 9/10 BCx x2 - NGTD  Goal of Therapy:  INR 2.5-3.5 Monitor platelets by anticoagulation protocol: Yes   Plan:  -Start IV heparin at 1050 units/hr WITHOUT bolus -Increase Vancomycin to 1 gm IV Q 12 hours -Continue Zosyn 3.375 gm IV Q8 hours -F/u 6 hr HL -Monitor daily HL, CBC and s/s of bleeding  Vinnie Level, PharmD., BCPS Clinical Pharmacist Pager [phone]  ________________________________ Addendum:  Heparin undetectable. Bolus 2000 units and increase drip to 1300 units/hr. No issues per RN. Repeat HL at 0200  Isaac Bliss, PharmD, [facility] Clinical Pharmacist Pager [phone] 08/02/2015 7:24 PM

## 2015-08-02 NOTE — Progress Notes (Signed)
CRITICAL VALUE ALERT  Critical value received:  Ett 1.3 above the carina   Date of notification:  08/02/15  Time of notification:  0735  Critical value read back:Yes.    Nurse who received alert:  Ree Edman, RN   MD notified (1st page):  Dr. Isaiah Serge  Time of first page:  [phone]   Responding MD: Dr. Isaiah Serge  Time MD responded:  [phone]   Orders established.

## 2015-08-03 ENCOUNTER — Ambulatory Visit (HOSPITAL_COMMUNITY): Payer: Medicaid Other

## 2015-08-03 ENCOUNTER — Inpatient Hospital Stay (HOSPITAL_COMMUNITY): Payer: Commercial Managed Care - HMO

## 2015-08-03 LAB — GLUCOSE, CAPILLARY
GLUCOSE-CAPILLARY: 29 mg/dL — AB (ref 65–99)
GLUCOSE-CAPILLARY: 203 mg/dL — AB (ref 65–99)
GLUCOSE-CAPILLARY: 160 mg/dL — AB (ref 65–99)
Glucose-Capillary: 123 mg/dL — ABNORMAL HIGH (ref 65–99)
Glucose-Capillary: 109 mg/dL — ABNORMAL HIGH (ref 65–99)
Glucose-Capillary: 79 mg/dL (ref 65–99)
Glucose-Capillary: 397 mg/dL — ABNORMAL HIGH (ref 65–99)

## 2015-08-03 LAB — HEPARIN LEVEL (UNFRACTIONATED): HEPARIN UNFRACTIONATED: 0.16 [IU]/mL — AB (ref 0.30–0.70)

## 2015-08-03 LAB — CBC
HCT: 24.8 % — ABNORMAL LOW (ref 36.0–46.0)
Hemoglobin: 7.7 g/dL — ABNORMAL LOW (ref 12.0–15.0)
MCH: 24.9 pg — ABNORMAL LOW (ref 26.0–34.0)
MCHC: 31 g/dL (ref 30.0–36.0)
MCV: 80.3 fL (ref 78.0–100.0)
PLATELETS: 130 10*3/uL — AB (ref 150–400)
RBC: 3.09 MIL/uL — AB (ref 3.87–5.11)
RDW: 21.9 % — AB (ref 11.5–15.5)
WBC: 7.8 10*3/uL (ref 4.0–10.5)

## 2015-08-03 LAB — PROTIME-INR
INR: 1.64 — ABNORMAL HIGH (ref 0.00–1.49)
Prothrombin Time: 19.4 seconds — ABNORMAL HIGH (ref 11.6–15.2)

## 2015-08-03 LAB — BASIC METABOLIC PANEL
Anion gap: 8 (ref 5–15)
BUN: 21 mg/dL — AB (ref 6–20)
CALCIUM: 8.4 mg/dL — AB (ref 8.9–10.3)
CO2: 25 mmol/L (ref 22–32)
CREATININE: 0.71 mg/dL (ref 0.44–1.00)
Chloride: 106 mmol/L (ref 101–111)
Glucose, Bld: 137 mg/dL — ABNORMAL HIGH (ref 65–99)
Potassium: 3.6 mmol/L (ref 3.5–5.1)
SODIUM: 139 mmol/L (ref 135–145)

## 2015-08-03 MED ORDER — HEPARIN BOLUS VIA INFUSION
2000.0000 [IU] | Freq: Once | INTRAVENOUS | Status: AC
  Administered 2015-08-03: 2000 [IU] via INTRAVENOUS
  Filled 2015-08-03: qty 2000

## 2015-08-03 MED ORDER — FUROSEMIDE 10 MG/ML IJ SOLN
40.0000 mg | Freq: Every day | INTRAMUSCULAR | Status: DC
  Administered 2015-08-03 – 2015-08-04 (×2): 40 mg via INTRAVENOUS
  Filled 2015-08-03 (×4): qty 4

## 2015-08-03 MED ORDER — DEXTROSE 50 % IV SOLN
INTRAVENOUS | Status: AC
  Administered 2015-08-03: 50 mL
  Filled 2015-08-03: qty 50

## 2015-08-03 NOTE — Progress Notes (Signed)
ANTICOAGULATION CONSULT NOTE - Follow Up Consult  Pharmacy Consult for Heparin Indication: AVR  Allergies  Allergen Reactions  . Celebrex [Celecoxib] Rash  . Detrol [Tolterodine] Hives    Patient Measurements: Height: 5\' 3"  (160 cm) Weight: 164 lb 7.4 oz (74.6 kg) IBW/kg (Calculated) : 52.4 Heparin Dosing Weight: 70 kg  Vital Signs: Temp: 99.9 F (37.7 C) (09/14 1600) Temp Source: Core (Comment) (09/14 0800) BP: 133/54 mmHg (09/14 1559) Pulse Rate: 81 (09/14 1600)  Labs:  Recent Labs  08/01/15 0530 08/02/15 0424  08/03/15 0120 08/03/15 0424 08/03/15 0835 08/03/15 1623  HGB  --  7.2*  --   --  7.7*  --   --   HCT  --  23.0*  --   --  24.8*  --   --   PLT  --  120*  --   --  130*  --   --   LABPROT 28.7* 25.2*  --   --   --  19.4*  --   INR 2.75* 2.32*  --   --   --  1.64*  --   HEPARINUNFRC  --   --   < > <0.10*  --  <0.10* 0.16*  CREATININE 0.87 0.70  --   --  0.71  --   --   < > = values in this interval not displayed.  Estimated Creatinine Clearance: 77.8 mL/min (by C-G formula based on Cr of 0.71).  Assessment: 54 yo female with hx of AVR, on warfarin PTA, admitted with INR of 5.85 (no s/sx bleeding), the patient rec'd 3 units of FFP on 07/31/15. INR down to 1.64 today. Pt was started on a heparin drip 9/12 evening. Heparin levels have surprisingly been SUBtherapeutic despite multiple rate adjustments (< 0.1 x 3) and continues to be subtherapeutic this afternoon (0.16). No issues with line or infusion.   PTA warfarin dose: 7.5 mg qWed, 5 mg all other days  INR goal PTA 2.5-3.5  Goal of Therapy:  Heparin level 0.3-0.7 units/ml Monitor platelets by anticoagulation protocol: Yes   Plan:   Heparin 2000 units IV bolus.  Increase heparin drip to 2000 units/hr.  Heparin level ~ 6 hrs after increase.  Daily heparin level and CBC.  11/12, Dennie Fetters Pager: [phone] 08/03/2015,5:09 PM
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
## 2015-08-03 NOTE — Progress Notes (Signed)
ANTICOAGULATION CONSULT NOTE - Follow Up Consult  Pharmacy Consult for heparin Indication: AVR   Labs:  Recent Labs  07/31/15 0515  07/31/15 0604 07/31/15 0930 07/31/15 1300 08/01/15 0530 08/02/15 0424 08/02/15 1715 08/03/15 0120  HGB  --   --  8.6*  --   --   --  7.2*  --   --   HCT  --   --  26.2*  --   --   --  23.0*  --   --   PLT  --   --  206  --   --   --  120*  --   --   LABPROT  --   < > 50.6*  --  31.4* 28.7* 25.2*  --   --   INR  --   < > 5.85*  --  3.10* 2.75* 2.32*  --   --   HEPARINUNFRC  --   --   --   --   --   --   --  <0.10* <0.10*  CREATININE 1.25*  --   --  1.19*  --  0.87 0.70  --   --   CKTOTAL 128  --   --   --   --   --   --   --   --   < > = values in this interval not displayed.    Assessment: 54yo female remains undetectable on heparin despite rate increase, no gtt issues per RN.  Goal of Therapy:  Heparin level 0.3-0.7 units/ml   Plan:  Will increase heparin gtt by 4 units/kg/hr to 1600 units/hr and check level in 6hr.  Vernard Gambles, PharmD, BCPS  08/03/2015,2:24 AM
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
## 2015-08-04 ENCOUNTER — Inpatient Hospital Stay (HOSPITAL_COMMUNITY): Payer: Commercial Managed Care - HMO

## 2015-08-04 LAB — BASIC METABOLIC PANEL
ANION GAP: 8 (ref 5–15)
BUN: 21 mg/dL — ABNORMAL HIGH (ref 6–20)
CO2: 26 mmol/L (ref 22–32)
Calcium: 8.6 mg/dL — ABNORMAL LOW (ref 8.9–10.3)
Chloride: 104 mmol/L (ref 101–111)
Creatinine, Ser: 0.68 mg/dL (ref 0.44–1.00)
GFR calc Af Amer: >60 mL/min (ref >60)
GLUCOSE: 62 mg/dL — AB (ref 65–99)
POTASSIUM: 3.9 mmol/L (ref 3.5–5.1)
Sodium: 138 mmol/L (ref 135–145)

## 2015-08-04 LAB — GLUCOSE, CAPILLARY
GLUCOSE-CAPILLARY: 58 mg/dL — AB (ref 65–99)
GLUCOSE-CAPILLARY: 143 mg/dL — AB (ref 65–99)
GLUCOSE-CAPILLARY: 274 mg/dL — AB (ref 65–99)
Glucose-Capillary: 139 mg/dL — ABNORMAL HIGH (ref 65–99)
Glucose-Capillary: 178 mg/dL — ABNORMAL HIGH (ref 65–99)
Glucose-Capillary: 204 mg/dL — ABNORMAL HIGH (ref 65–99)
Glucose-Capillary: 379 mg/dL — ABNORMAL HIGH (ref 65–99)

## 2015-08-04 LAB — CBC
HEMATOCRIT: 23.1 % — AB (ref 36.0–46.0)
HEMOGLOBIN: 7.2 g/dL — AB (ref 12.0–15.0)
MCH: 24.8 pg — ABNORMAL LOW (ref 26.0–34.0)
MCHC: 31.2 g/dL (ref 30.0–36.0)
MCV: 79.7 fL (ref 78.0–100.0)
Platelets: 167 10*3/uL (ref 150–400)
RBC: 2.9 MIL/uL — AB (ref 3.87–5.11)
RDW: 21.6 % — ABNORMAL HIGH (ref 11.5–15.5)
WBC: 9.3 10*3/uL (ref 4.0–10.5)

## 2015-08-04 LAB — CULTURE, BLOOD (ROUTINE X 2)
CULTURE: NO GROWTH
Culture: NO GROWTH

## 2015-08-04 LAB — HEPARIN LEVEL (UNFRACTIONATED)
HEPARIN UNFRACTIONATED: 1.88 [IU]/mL — AB (ref 0.30–0.70)
HEPARIN UNFRACTIONATED: 1.88 [IU]/mL — AB (ref 0.30–0.70)
Heparin Unfractionated: 1.62 IU/mL — ABNORMAL HIGH (ref 0.30–0.70)
Heparin Unfractionated: 1.28 IU/mL — ABNORMAL HIGH (ref 0.30–0.70)

## 2015-08-04 LAB — C DIFFICILE QUICK SCREEN W PCR REFLEX
C DIFFICILE (CDIFF) TOXIN: NEGATIVE
C DIFFICLE (CDIFF) ANTIGEN: NEGATIVE
C Diff interpretation: NEGATIVE

## 2015-08-04 MED ORDER — DEXTROSE 50 % IV SOLN
INTRAVENOUS | Status: AC
  Filled 2015-08-04: qty 50

## 2015-08-04 MED ORDER — CETYLPYRIDINIUM CHLORIDE 0.05 % MT LIQD
7.0000 mL | Freq: Two times a day (BID) | OROMUCOSAL | Status: DC
  Administered 2015-08-04: 7 mL via OROMUCOSAL

## 2015-08-04 MED ORDER — HEPARIN (PORCINE) IN NACL 100-0.45 UNIT/ML-% IJ SOLN: 1300.0000 [IU]/h | INTRAMUSCULAR | Status: DC

## 2015-08-04 MED ORDER — HEPARIN (PORCINE) IN NACL 100-0.45 UNIT/ML-% IJ SOLN
1000.0000 [IU]/h | INTRAMUSCULAR | Status: DC
  Administered 2015-08-05: 1000 [IU]/h via INTRAVENOUS
  Filled 2015-08-04 (×2): qty 250

## 2015-08-04 MED ORDER — DEXTROSE 50 % IV SOLN
25.0000 mL | Freq: Once | INTRAVENOUS | Status: AC
  Administered 2015-08-04: 25 mL via INTRAVENOUS

## 2015-08-04 MED ORDER — HEPARIN (PORCINE) IN NACL 100-0.45 UNIT/ML-% IJ SOLN
1600.0000 [IU]/h | INTRAMUSCULAR | Status: DC
  Filled 2015-08-04 (×2): qty 250

## 2015-08-04 MED ORDER — INSULIN ASPART 100 UNIT/ML ~~LOC~~ SOLN
0.0000 [IU] | SUBCUTANEOUS | Status: DC
  Administered 2015-08-04: 9 [IU] via SUBCUTANEOUS
  Administered 2015-08-04: 2 [IU] via SUBCUTANEOUS
  Administered 2015-08-05: 3 [IU] via SUBCUTANEOUS
  Administered 2015-08-05 (×2): 2 [IU] via SUBCUTANEOUS
  Administered 2015-08-06: 5 [IU] via SUBCUTANEOUS

## 2015-08-04 NOTE — Care Management Note (Signed)
Case Management Note  Patient Details  Name: Madison [name] MRN: [id number] Date of Birth: Oct 01, 1961  Subjective/Objective:         Patient extubated today.  Sister here.  Patient was discharged from [facility] SNF on 9-9.  Per sister, patient lives at home alone and was set up with [facility]??.  They saw her on Friday, the day of discharge and returned Saturday am and found her down.  Waiting for call back from [facility] for more information.  SW working with patient also, will most likely need SNF again on discharge.             Action/Plan:   Expected Discharge Date:                  Expected Discharge Plan:  Skilled Nursing Facility  In-House Referral:  Clinical Social Work  Discharge planning Services  CM Consult  Post Acute Care Choice:    Choice offered to:     DME Arranged:    DME Agency:     HH Arranged:    HH Agency:     Status of Service:  In process, will continue to follow  Medicare Important Message Given:    Date Medicare IM Given:    Medicare IM give by:    Date Additional Medicare IM Given:    Additional Medicare Important Message give by:     If discussed at Long Length of Stay Meetings, dates discussed:    Additional Comments:  Vangie Bicker, RN 08/04/2015, 2:34 PM
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
## 2015-08-04 NOTE — Progress Notes (Signed)
Received in morning report that patient has had labile blood sugars (low's in 30's and a high of 397). Discussed insulin regime with Jill Side (pharmacist) and made her aware that I was going to hold AM insulin coverage until I saw Dr Rich Reining on rounds. She agreed  Madison [name]

## 2015-08-04 NOTE — Progress Notes (Signed)
Hypoglycemic Event  CBG: 58  Treatment: D50 IV 25 mL  Symptoms: Pale  Follow-up CBG: Time:0420 CBG Result: 139  Possible Reasons for Event: Unknown  Comments/MD notified:     Tinya Cadogan K  Remember to initiate Hypoglycemia Order Set & complete

## 2015-08-04 NOTE — Clinical Social Work Note (Signed)
Clinical Social Worker has assessed patient and family at bedside. Full psychosocial assessment to follow.  CSW remains available as needed.   Derenda Fennel, MSW, LCSWA [phone] 08/04/2015 3:48 PM

## 2015-08-04 NOTE — Care Management Important Message (Signed)
Important Message  Patient Details  Name: Madison [name] MRN: [id number] Date of Birth: 1961/08/13   Medicare Important Message Given:  Yes-second notification given    Kyla Balzarine 08/04/2015, 3:38 PM
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
## 2015-08-04 NOTE — Procedures (Signed)
Extubation Procedure Note  Patient Details:   Name: CALEAH TORTORELLI DOB: 23-Dec-1960 MRN: [id number]   Airway Documentation:     Evaluation  O2 sats: stable throughout Complications: No apparent complications Patient did tolerate procedure well. Bilateral Breath Sounds: Clear, Diminished Suctioning: Oral, Airway Yes   Patient was extubated to a 4L [place]. Cuff leak was heard. No stridor noted. Pt BS were clear and diminished. Patient currently at a sat of 100. RN was at bedside with RT. RT will continue to monitor.  Darolyn Rua 08/04/2015, 12:05 PM

## 2015-08-04 NOTE — Progress Notes (Signed)
ANTICOAGULATION CONSULT NOTE - Initial Consult  Pharmacy Consult for heparin Indication: AVR  Allergies  Allergen Reactions  . Celebrex [Celecoxib] Rash  . Detrol [Tolterodine] Hives    Patient Measurements: Height: 5\' 3"  (160 cm) Weight: 163 lb 9.3 oz (74.2 kg) IBW/kg (Calculated) : 52.4 Heparin Dosing Weight: 70 kg  Vital Signs: Temp: 99.7 F (37.6 C) (09/15 0600) Temp Source: Core (Comment) (09/15 0400) BP: 159/61 mmHg (09/15 0349) Pulse Rate: 71 (09/15 0600)  Labs:  Recent Labs  08/02/15 0424  08/03/15 0424 08/03/15 0835 08/03/15 1623 08/04/15 0350 08/04/15 0532  HGB 7.2*  --  7.7*  --   --  7.2*  --   HCT 23.0*  --  24.8*  --   --  23.1*  --   PLT 120*  --  130*  --   --  167  --   LABPROT 25.2*  --   --  19.4*  --   --   --   INR 2.32*  --   --  1.64*  --   --   --   HEPARINUNFRC  --   < >  --  <0.10* 0.16* 1.62* 1.88*  CREATININE 0.70  --  0.71  --   --  0.68  --   < > = values in this interval not displayed.  Estimated Creatinine Clearance: 77.5 mL/min (by C-G formula based on Cr of 0.68).   Medical History: Past Medical History  Diagnosis Date  . CAD (coronary artery disease)   . Obesity   . Hypercholesteremia   . HTN (hypertension)   . Dyslipidemia   . Aortic stenosis   . Thrombophlebitis   . Heart murmur   . Myocardial infarction 12/2014  . Pneumonia 11/2014; 12/2014  . GERD (gastroesophageal reflux disease)   . 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 Anxiety   . Depression   . Allergy   . CHF (congestive heart failure)   . Rheumatoid arthritis(714.0)     "hands" (07/01/2015)  . Diabetes dx'd 1982    "went straight to insulin"    Medications:  Prescriptions prior to admission  Medication Sig Dispense Refill Last Dose  . aspirin 81 MG tablet Take 1 tablet (81 mg total) by mouth daily. 30 tablet  unknown at unknown  . baclofen (LIORESAL) 10 MG tablet TAKE 1 TABLET (10  MG TOTAL) BY MOUTH 2 (TWO) TIMES DAILY. 60 tablet 1 unknown at unknown  . clonazePAM (KLONOPIN) 0.5 MG tablet Take 1 tablet (0.5 mg total) by mouth 2 (two) times daily. 10 tablet 0 unknown at unknown  . escitalopram (LEXAPRO) 20 MG tablet Take 20 mg by mouth daily.   unknown at unknown  . HYDROcodone-acetaminophen (NORCO) 7.5-325 MG per tablet Take 1-2 tablets by mouth every 6 (six) hours as needed for moderate pain. 90 tablet 0 unknown at unknown  . insulin aspart (NOVOLOG) 100 UNIT/ML injection Inject 0-15 Units into the skin 3 (three) times daily with meals. 10 mL 11 unknown  . insulin detemir (LEVEMIR) 100 UNIT/ML injection Inject 0.18 mLs (18 Units total) into the skin 2 (two) times daily. 10 mL 11 unknown  . iron polysaccharides (NIFEREX) 150 MG capsule Take 150 mg by mouth daily.   unknown at unknown  . lisinopril (PRINIVIL,ZESTRIL) 2.5 MG tablet TAKE 1 TABLET BY MOUTH EVERY DAY 30 tablet 5 unknown at unknown  . metoprolol tartrate (LOPRESSOR) 25  MG tablet Take 12.5 mg by mouth 2 (two) times daily.   unknown at unknown  . omeprazole (PRILOSEC) 40 MG capsule TAKE 1 CAPSULE (40 MG TOTAL) BY MOUTH DAILY. 30 capsule 6 unknown at unknown  . pravastatin (PRAVACHOL) 20 MG tablet TAKE 1 TABLET BY MOUTH EVERY DAY 90 tablet 0 unknown at Unknown time  . RHEUMATREX 2.5 MG tablet TAKE 1 TABLET 2 TIMES A WEEK (Patient taking differently: TAKE 1 TABLET 2 TIMES A WEEK (MON AND FRI)) 24 tablet 1 unknown at unknown  . solifenacin (VESICARE) 5 MG tablet Take 1 tablet (5 mg total) by mouth daily. 90 tablet 2 unknown at unknown  . traZODone (DESYREL) 50 MG tablet Take 0.5-1 tablets (25-50 mg total) by mouth at bedtime as needed for sleep. 10 tablet 0 unknown at unknown  . warfarin (COUMADIN) 5 MG tablet Take 5 mg by mouth daily.   unknown at unknown  . warfarin (COUMADIN) 7.5 MG tablet Take 7.5 mg by mouth See admin instructions. Only take 7.5mg  on wednesdays per [facility]   unknown at unknown  . zolpidem (AMBIEN) 10 MG  tablet Take 10 mg by mouth at bedtime as needed. for sleep  1 unknown at unknown  . clopidogrel (PLAVIX) 75 MG tablet Take 75 mg by mouth daily.  2 Not Taking at Unknown time  . glucagon (GLUCAGON EMERGENCY) 1 MG injection Inject 1 mg into the vein once as needed. (Patient taking differently: Inject 1 mg into the vein daily as needed. ) 1 each 5 unknown at unknown  . HUMULIN N KWIKPEN 100 UNIT/ML Kiwkpen Inject 28 Units into the skin every morning.  11   . metoprolol tartrate (LOPRESSOR) 25 MG tablet Take 0.5 tablets (12.5 mg total) by mouth 2 (two) times daily. (Patient not taking: Reported on 08/01/2015) 60 tablet 0 Not Taking at Unknown time  . traMADol (ULTRAM) 50 MG tablet Take 50 mg by mouth at bedtime as needed.  0 Not Taking at Unknown time  . warfarin (COUMADIN) 5 MG tablet TAKE 1-1.5 BY MOUTH DAILY AT 6PM. TAKE 1 TAB DAILY TUES,TH,F,SAT & SUN. TAKE 1& 1/2 TAB ON MON &WED (Patient not taking: Reported on 08/01/2015) 60 tablet 0 Not Taking at Unknown time    Assessment:  54 yo female with hx of AVR on warfarin PTA, admitted with INR of 5.85 (no s/sx bleeding), pt received 3 units FFP on 9/13. Warfarin has been held since admission, therefore pt was started on heparin. Heparin level was supratherapeutic this morning. Last night the pt lost IV access to where heparin was infusing, all prior subtherapeutic levels were likely not accurate due to IV infiltration. Supratherapeutic level this morning was not associated with any bleeding. Hgb usually in 9-10 range, but has been holding 7-8 during admission, plts wnl.  PTA warfarin dose: 7.5 mg qWed, 5 mg all other days  INR goal PTA 2.5-3.5  Goal of Therapy:  Heparin level 0.3-0.7 units/ml Monitor platelets by anticoagulation protocol: Yes   Plan:  -Hold heparin for 1 hour, reduce rate to 1600 units/hr -Recheck level this afternoon -Monitor closely for s/sx bleeding   Agapito Games, PharmD, BCPS Clinical Pharmacist Pager:  [phone] 08/04/2015 7:16 AM

## 2015-08-04 NOTE — Progress Notes (Signed)
ANTICOAGULATION CONSULT NOTE  Pharmacy Consult for heparin Indication: AVR  Allergies  Allergen Reactions  . Celebrex [Celecoxib] Rash  . Detrol [Tolterodine] Hives    Patient Measurements: Height: 5\' 3"  (160 cm) Weight: 163 lb 9.3 oz (74.2 kg) IBW/kg (Calculated) : 52.4 Heparin Dosing Weight: 70 kg  Vital Signs: Temp: 100.4 F (38 C) (09/15 2200) Temp Source: Oral (09/15 2000) BP: 112/42 mmHg (09/15 2200) Pulse Rate: 72 (09/15 2200)  Labs:  Recent Labs  08/02/15 0424  08/03/15 0424 08/03/15 0835  08/04/15 0350 08/04/15 0532 08/04/15 1419 08/04/15 2155  HGB 7.2*  --  7.7*  --   --  7.2*  --   --   --   HCT 23.0*  --  24.8*  --   --  23.1*  --   --   --   PLT 120*  --  130*  --   --  167  --   --   --   LABPROT 25.2*  --   --  19.4*  --   --   --   --   --   INR 2.32*  --   --  1.64*  --   --   --   --   --   HEPARINUNFRC  --   < >  --  <0.10*  < > 1.62* 1.88* 1.88* 1.28*  CREATININE 0.70  --  0.71  --   --  0.68  --   --   --   < > = values in this interval not displayed.  Estimated Creatinine Clearance: 77.5 mL/min (by C-G formula based on Cr of 0.68).   Medical History: Past Medical History  Diagnosis Date  . CAD (coronary artery disease)   . Obesity   . Hypercholesteremia   . HTN (hypertension)   . Dyslipidemia   . Aortic stenosis   . Thrombophlebitis   . Heart murmur   . Myocardial infarction 12/2014  . Pneumonia 11/2014; 12/2014  . GERD (gastroesophageal reflux disease)   . 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 Anxiety   . Depression   . Allergy   . CHF (congestive heart failure)   . Rheumatoid arthritis(714.0)     "hands" (07/01/2015)  . Diabetes dx'd 1982    "went straight to insulin"    Medications:  Prescriptions prior to admission  Medication Sig Dispense Refill Last Dose  . aspirin 81 MG tablet Take 1 tablet (81 mg total) by mouth daily. 30 tablet  unknown at  unknown  . baclofen (LIORESAL) 10 MG tablet TAKE 1 TABLET (10 MG TOTAL) BY MOUTH 2 (TWO) TIMES DAILY. 60 tablet 1 unknown at unknown  . clonazePAM (KLONOPIN) 0.5 MG tablet Take 1 tablet (0.5 mg total) by mouth 2 (two) times daily. 10 tablet 0 unknown at unknown  . escitalopram (LEXAPRO) 20 MG tablet Take 20 mg by mouth daily.   unknown at unknown  . HYDROcodone-acetaminophen (NORCO) 7.5-325 MG per tablet Take 1-2 tablets by mouth every 6 (six) hours as needed for moderate pain. 90 tablet 0 unknown at unknown  . insulin aspart (NOVOLOG) 100 UNIT/ML injection Inject 0-15 Units into the skin 3 (three) times daily with meals. 10 mL 11 unknown  . insulin detemir (LEVEMIR) 100 UNIT/ML injection Inject 0.18 mLs (18 Units total) into the skin 2 (two) times daily. 10 mL 11 unknown  . iron polysaccharides (NIFEREX) 150  MG capsule Take 150 mg by mouth daily.   unknown at unknown  . lisinopril (PRINIVIL,ZESTRIL) 2.5 MG tablet TAKE 1 TABLET BY MOUTH EVERY DAY 30 tablet 5 unknown at unknown  . metoprolol tartrate (LOPRESSOR) 25 MG tablet Take 12.5 mg by mouth 2 (two) times daily.   unknown at unknown  . omeprazole (PRILOSEC) 40 MG capsule TAKE 1 CAPSULE (40 MG TOTAL) BY MOUTH DAILY. 30 capsule 6 unknown at unknown  . pravastatin (PRAVACHOL) 20 MG tablet TAKE 1 TABLET BY MOUTH EVERY DAY 90 tablet 0 unknown at Unknown time  . RHEUMATREX 2.5 MG tablet TAKE 1 TABLET 2 TIMES A WEEK (Patient taking differently: TAKE 1 TABLET 2 TIMES A WEEK (MON AND FRI)) 24 tablet 1 unknown at unknown  . solifenacin (VESICARE) 5 MG tablet Take 1 tablet (5 mg total) by mouth daily. 90 tablet 2 unknown at unknown  . traZODone (DESYREL) 50 MG tablet Take 0.5-1 tablets (25-50 mg total) by mouth at bedtime as needed for sleep. 10 tablet 0 unknown at unknown  . warfarin (COUMADIN) 5 MG tablet Take 5 mg by mouth daily.   unknown at unknown  . warfarin (COUMADIN) 7.5 MG tablet Take 7.5 mg by mouth See admin instructions. Only take 7.5mg  on  wednesdays per [facility]   unknown at unknown  . zolpidem (AMBIEN) 10 MG tablet Take 10 mg by mouth at bedtime as needed. for sleep  1 unknown at unknown  . clopidogrel (PLAVIX) 75 MG tablet Take 75 mg by mouth daily.  2 Not Taking at Unknown time  . glucagon (GLUCAGON EMERGENCY) 1 MG injection Inject 1 mg into the vein once as needed. (Patient taking differently: Inject 1 mg into the vein daily as needed. ) 1 each 5 unknown at unknown  . HUMULIN N KWIKPEN 100 UNIT/ML Kiwkpen Inject 28 Units into the skin every morning.  11   . metoprolol tartrate (LOPRESSOR) 25 MG tablet Take 0.5 tablets (12.5 mg total) by mouth 2 (two) times daily. (Patient not taking: Reported on 08/01/2015) 60 tablet 0 Not Taking at Unknown time  . traMADol (ULTRAM) 50 MG tablet Take 50 mg by mouth at bedtime as needed.  0 Not Taking at Unknown time  . warfarin (COUMADIN) 5 MG tablet TAKE 1-1.5 BY MOUTH DAILY AT 6PM. TAKE 1 TAB DAILY TUES,TH,F,SAT & SUN. TAKE 1& 1/2 TAB ON MON &WED (Patient not taking: Reported on 08/01/2015) 60 tablet 0 Not Taking at Unknown time    Assessment:  54 yo female with hx of AVR on warfarin PTA, admitted with INR of 5.85 (no s/sx bleeding), pt received 3 units FFP on 9/13. Warfarin has been held since admission, therefore pt was started on heparin. Heparin level was supratherapeutic this morning. Last night the pt lost IV access to where heparin was infusing, all prior subtherapeutic levels were likely not accurate due to IV infiltration. Supratherapeutic level this morning was not associated with any bleeding. Hgb usually in 9-10 range, but has been holding 7-8 during admission, plts wnl.  This afternoon, HL is still quite supratherapeutic despite rate decrease. No s/sx bleeding. F/u HL remains supratherapeutic at 1.28 on heparin 1300 units/hr. Nurse reports no issues with infusion or bleeding. Will hold for an additional hour and reduce rate.  PTA warfarin dose: 7.5 mg qWed, 5 mg all other days   Goal of  Therapy:  Heparin level 0.3-0.7 units/ml INR goal 2.5-3.5 Monitor platelets by anticoagulation protocol: Yes   Plan:  -Hold heparin for 1 hour,  reduce rate to 1000 units/hr -6h HL -Monitor closely for s/sx bleeding   Arlean Hopping. Newman Pies, PharmD Clinical Pharmacist Pager [phone]  08/04/2015 10:32 PM

## 2015-08-04 NOTE — Progress Notes (Addendum)
Inpatient Diabetes Program Recommendations  AACE/ADA: New Consensus Statement on Inpatient Glycemic Control (2015)  Target Ranges:  Prepandial:   less than 140 mg/dL      Peak postprandial:   less than 180 mg/dL (1-2 hours)      Critically ill patients:  140 - 180 mg/dL   Review of Glycemic Control:   Results for ANELIS, HRIVNAK (MRN [id number]) as of 08/04/2015 11:56  Ref. Range 08/03/2015 19:38 08/04/2015 00:11 08/04/2015 03:57 08/04/2015 04:24 08/04/2015 07:51  Glucose-Capillary Latest Ref Range: 65-99 mg/dL 782 (H) 956 (H) 58 (L) 139 (H) 143 (H)   Blood sugars labile.  Called and discussed with RN Morrie Sheldon.  She states that patient will be extubated today.  Note that patient did not receive correction or meal coverage this morning.  Recommend d/c of Novolog tube feed coverage and continue Levemir and Novolog correction.  Thanks, Beryl Meager, RN, BC-ADM Inpatient Diabetes Coordinator Pager [phone] (8a-5p)

## 2015-08-05 ENCOUNTER — Ambulatory Visit (HOSPITAL_COMMUNITY): Payer: Medicaid Other

## 2015-08-05 LAB — GLUCOSE, CAPILLARY
GLUCOSE-CAPILLARY: 105 mg/dL — AB (ref 65–99)
GLUCOSE-CAPILLARY: 94 mg/dL (ref 65–99)
GLUCOSE-CAPILLARY: 166 mg/dL — AB (ref 65–99)
Glucose-Capillary: 118 mg/dL — ABNORMAL HIGH (ref 65–99)
Glucose-Capillary: 159 mg/dL — ABNORMAL HIGH (ref 65–99)
Glucose-Capillary: 183 mg/dL — ABNORMAL HIGH (ref 65–99)
Glucose-Capillary: 237 mg/dL — ABNORMAL HIGH (ref 65–99)
Glucose-Capillary: 286 mg/dL — ABNORMAL HIGH (ref 65–99)
Glucose-Capillary: 58 mg/dL — ABNORMAL LOW (ref 65–99)
Glucose-Capillary: 59 mg/dL — ABNORMAL LOW (ref 65–99)

## 2015-08-05 LAB — CBC
HCT: 24.1 % — ABNORMAL LOW (ref 36.0–46.0)
Hemoglobin: 7.8 g/dL — ABNORMAL LOW (ref 12.0–15.0)
MCH: 26.3 pg (ref 26.0–34.0)
MCHC: 32.4 g/dL (ref 30.0–36.0)
MCV: 81.1 fL (ref 78.0–100.0)
Platelets: 210 10*3/uL (ref 150–400)
RBC: 2.97 MIL/uL — ABNORMAL LOW (ref 3.87–5.11)
RDW: 21.5 % — AB (ref 11.5–15.5)
WBC: 7.6 10*3/uL (ref 4.0–10.5)

## 2015-08-05 LAB — BASIC METABOLIC PANEL
Anion gap: 6 (ref 5–15)
BUN: 12 mg/dL (ref 6–20)
CALCIUM: 9.1 mg/dL (ref 8.9–10.3)
CO2: 28 mmol/L (ref 22–32)
CREATININE: 0.68 mg/dL (ref 0.44–1.00)
Chloride: 105 mmol/L (ref 101–111)
GFR calc Af Amer: >60 mL/min (ref >60)
Glucose, Bld: 59 mg/dL — ABNORMAL LOW (ref 65–99)
Potassium: 3.7 mmol/L (ref 3.5–5.1)
SODIUM: 139 mmol/L (ref 135–145)

## 2015-08-05 LAB — PHOSPHORUS: Phosphorus: 3.5 mg/dL (ref 2.5–4.6)

## 2015-08-05 LAB — HEPARIN LEVEL (UNFRACTIONATED): Heparin Unfractionated: 0.59 IU/mL (ref 0.30–0.70)

## 2015-08-05 LAB — MAGNESIUM: MAGNESIUM: 1.9 mg/dL (ref 1.7–2.4)

## 2015-08-05 MED ORDER — WARFARIN - PHARMACIST DOSING INPATIENT
Freq: Every day | Status: DC
  Administered 2015-08-06 – 2015-08-08 (×2)

## 2015-08-05 MED ORDER — WARFARIN SODIUM 5 MG PO TABS
5.0000 mg | ORAL_TABLET | Freq: Once | ORAL | Status: AC
  Filled 2015-08-05 (×2): qty 1

## 2015-08-05 MED ORDER — ENOXAPARIN SODIUM 80 MG/0.8ML ~~LOC~~ SOLN
70.0000 mg | Freq: Two times a day (BID) | SUBCUTANEOUS | Status: DC
  Administered 2015-08-05 – 2015-08-09 (×9): 70 mg via SUBCUTANEOUS
  Filled 2015-08-05 (×10): qty 0.8

## 2015-08-05 MED ORDER — PANTOPRAZOLE SODIUM 40 MG PO TBEC
40.0000 mg | DELAYED_RELEASE_TABLET | Freq: Every day | ORAL | Status: DC
  Administered 2015-08-05 – 2015-08-09 (×5): 40 mg via ORAL
  Filled 2015-08-05 (×5): qty 1

## 2015-08-05 NOTE — Evaluation (Signed)
Physical Therapy Evaluation Patient Details Name: Madison [name] MRN: [id number] DOB: 01/10/1961 Today's Date: 08/05/2015   History of Present Illness  Pt is a 54 y/o female with a PMH of recent ORIF for L trimalleolar fracture. She d/c'd to SNF and returned home with HHPT services on 9/9. She was found down by home health the next day and presented to the ED in severe septic shock. She was extubated on 08/04/15.  Clinical Impression  Pt admitted with above diagnosis. Pt currently with functional limitations due to the deficits listed below (see PT Problem List). At the time of PT eval pt was able to perform transfers with +2 min assist for balance support and safety. She required increased cueing to maintain NWB status on the LLE, however was not fully able to keep the LLE off the floor during transition to recliner. Pt wanting to return home at d/c, however feel return to SNF may be more appropriate at this point for safety and continued therapies. Pt will benefit from skilled PT to increase their independence and safety with mobility to allow discharge to the venue listed below.       Follow Up Recommendations SNF;Supervision/Assistance - 24 hour    Equipment Recommendations  3in1 (PT)    Recommendations for Other Services       Precautions / Restrictions Precautions Precautions: Fall Required Braces or Orthoses: Other Brace/Splint Other Brace/Splint: Boot on LLE at all times Restrictions Weight Bearing Restrictions: Yes LLE Weight Bearing: Non weight bearing      Mobility  Bed Mobility Overal bed mobility: Needs Assistance Bed Mobility: Supine to Sit     Supine to sit: Supervision     General bed mobility comments: Use of rails and increased time required.   Transfers Overall transfer level: Needs assistance Equipment used: Rolling walker (2 wheeled) Transfers: Sit to/from [employer] Sit to Stand: Min assist;+2 physical assistance Stand pivot  transfers: Min assist;+2 physical assistance;+2 safety/equipment       General transfer comment: Increased time and frequent cues for NWB status on the LLE. Pt was able to rest foot on the floor (no weight on it however) but was not able to maintain full NWB. Shuffled RLE over to the chair.  Ambulation/Gait                Stairs            Wheelchair Mobility    Modified Rankin (Stroke Patients Only)       Balance Overall balance assessment: Needs assistance Sitting-balance support: Feet supported;No upper extremity supported Sitting balance-Leahy Scale: Fair     Standing balance support: Bilateral upper extremity supported;During functional activity Standing balance-Leahy Scale: Poor                               Pertinent Vitals/Pain Pain Assessment: No/denies pain    Home Living Family/patient expects to be discharged to:: Private residence Living Arrangements: Alone Available Help at Discharge: Family;Available 24 hours/day Type of Home: House Home Access: Level entry     Home Layout: Two level Home Equipment: Cane - single point;Grab bars - toilet;Grab bars - tub/shower;Wheelchair - manual Additional Comments: Pt reports that she had a BSC but it broke. She also states that she does not have a RW (reported on previous admission that she does have one).    Prior Function Level of Independence: Needs assistance   Gait / Transfers Assistance Needed:  Pt reports she was able to transfer to/from the wheelchair without assistance. It is questionable whether she was maintaining precautions at home during transfers.  ADL's / Homemaking Assistance Needed: Pt reports she has needed assistance for all ADL's since her ankle surgery.         Hand Dominance   Dominant Hand: Right    Extremity/Trunk Assessment   Upper Extremity Assessment: Defer to OT evaluation           Lower Extremity Assessment: LLE deficits/detail   LLE Deficits /  Details: In boot - decreased strength consistent with prior surgery.   Cervical / Trunk Assessment: Normal  Communication   Communication: No difficulties  Cognition Arousal/Alertness: Awake/alert Behavior During Therapy: Anxious Overall Cognitive Status: Within Functional Limits for tasks assessed                      General Comments      Exercises        Assessment/Plan    PT Assessment Patient needs continued PT services  PT Diagnosis Difficulty walking;Generalized weakness   PT Problem List Decreased strength;Decreased range of motion;Decreased activity tolerance;Decreased balance;Decreased mobility;Decreased knowledge of use of DME;Decreased safety awareness;Decreased knowledge of precautions  PT Treatment Interventions Balance training;Functional mobility training;Therapeutic activities;Therapeutic exercise;Wheelchair mobility training;Patient/family education;DME instruction   PT Goals (Current goals can be found in the Care Plan section) Acute Rehab PT Goals Patient Stated Goal: Return home PT Goal Formulation: With patient Time For Goal Achievement: 08/12/15 Potential to Achieve Goals: Fair    Frequency Min 2X/week   Barriers to discharge Decreased caregiver support Pt lives alone    Co-evaluation               End of Session Equipment Utilized During Treatment: Gait belt Activity Tolerance: Patient tolerated treatment well Patient left: in chair;with call bell/phone within reach Nurse Communication: Mobility status         Time: 1310-1340 PT Time Calculation (min) (ACUTE ONLY): 30 min   Charges:   PT Evaluation $Initial PT Evaluation Tier I: 1 Procedure PT Treatments $Gait Training: 8-22 mins   PT G Codes:        Conni Slipper 09-Aug-2015, 2:02 PM   Conni Slipper, PT, DPT Acute Rehabilitation Services Pager: [phone]

## 2015-08-05 NOTE — Progress Notes (Signed)
Nutrition Follow-up  DOCUMENTATION CODES:   Not applicable  INTERVENTION:   Diet advancement as able per MD  NUTRITION DIAGNOSIS:   Inadequate oral intake related to inability to eat as evidenced by NPO status.  Ongoing  GOAL:   Patient will meet greater than or equal to 90% of their needs  Unmet  MONITOR:   Diet advancement, PO intake, Labs, Weight trends  ASSESSMENT:   54 y/o F admitted on 9/10 after being found altered at home. In severe septic shock of unclear source.  Discussed patient in ICU rounds and with RN today. Patient was extubated on 9/15. Diet was advanced to clear liquids and patient was tolerating per RN report. Hopeful to advance diet soon.  Diet Order:   NPO  Skin:  Wound (see comment) (stage II pressure ulcer to neck)  Last BM:  9/14  Height:   Ht Readings from Last 1 Encounters:  08/01/15 5\' 3"  (1.6 m)    Weight:   Wt Readings from Last 1 Encounters:  08/05/15 159 lb 9.8 oz (72.4 kg)    Ideal Body Weight:  52.3 kg  BMI:  Body mass index is 28.28 kg/(m^2).  Estimated Nutritional Needs:   Kcal:  1800-2000  Protein:  85-100 gm  Fluid:  1.8-2 L  EDUCATION NEEDS:   No education needs identified at this time  08/07/15, RD, LDN, CNSC Pager [phone] After Hours Pager [phone]

## 2015-08-05 NOTE — Progress Notes (Signed)
ANTICOAGULATION CONSULT NOTE  Pharmacy Consult for Lovenox Bridge/Warfarin Indication: AVR  Allergies  Allergen Reactions  . Celebrex [Celecoxib] Rash  . Detrol [Tolterodine] Hives    Patient Measurements: Height: 5\' 3"  (160 cm) Weight: 159 lb 9.8 oz (72.4 kg) IBW/kg (Calculated) : 52.4  Vital Signs: Temp: 98.7 F (37.1 C) (09/16 0900) Temp Source: Oral (09/16 0400) BP: 101/49 mmHg (09/16 0900) Pulse Rate: 66 (09/16 0900)  Labs:  Recent Labs  08/03/15 0424 08/03/15 0835  08/04/15 0350  08/04/15 1419 08/04/15 2155 08/05/15 0503  HGB 7.7*  --   --  7.2*  --   --   --  7.8*  HCT 24.8*  --   --  23.1*  --   --   --  24.1*  PLT 130*  --   --  167  --   --   --  210  LABPROT  --  19.4*  --   --   --   --   --   --   INR  --  1.64*  --   --   --   --   --   --   HEPARINUNFRC  --  <0.10*  < > 1.62*  < > 1.88* 1.28* 0.59  CREATININE 0.71  --   --  0.68  --   --   --  0.68  < > = values in this interval not displayed.  Estimated Creatinine Clearance: 76.7 mL/min (by C-G formula based on Cr of 0.68).   Medications:  Scheduled:  . enoxaparin (LOVENOX) injection  70 mg Subcutaneous Q12H  . insulin aspart  0-9 Units Subcutaneous 6 times per day  . insulin detemir  20 Units Subcutaneous QHS  . pantoprazole (PROTONIX) IV  40 mg Intravenous Q24H  . piperacillin-tazobactam (ZOSYN)  IV  3.375 g Intravenous Q8H  . warfarin  5 mg Oral ONCE-1800  . Warfarin - Pharmacist Dosing Inpatient   Does not apply q1800     PRN: acetaminophen, ondansetron (ZOFRAN) IV  Assessment: 54 yo female with hx of AVR on warfarin PTA, admitted with INR of 5.85 (no s/sx of bleeding), pt received 3 units FFP on 9/13. Warfarin has been held since admission, therefore patient was started on heparin.   HL is therapeutic and decision has been made to bridge patient back to warfarin with lovenox. H/h 7.8/24, plts wnl.  PTA warfarin dose: 7.5 mg qWed, 5mg  all other days INR goal PTA 2.5-3.5  Goal of  Therapy:  INR 2.5-3.5 Monitor platelets by anticoagulation protocol: Yes   Plan:  - DC heparin - Initiate Lovenox 70mg /mL [place] q12hrs one hour after stopping heparin - Give warfarin 5mg  PO x 1 tonight - Monitor CBC, s/sx bleeding, daily INR  12-03-1983  Student Pharmacist 08/05/2015,9:12 AM   I agree with the assessment and plan.    , PharmD, BCPS Clinical Pharmacist Pager: [phone] 08/05/2015 9:27 AM

## 2015-08-05 NOTE — Progress Notes (Signed)
Results for DEARA, BOBER (MRN [id number]) as of 08/05/2015 12:37  Ref. Range 08/05/2015 03:45 08/05/2015 04:43 08/05/2015 05:07 08/05/2015 07:54 08/05/2015 11:41  Glucose-Capillary Latest Ref Range: 65-99 mg/dL 59 (L) 58 (L) 834 (H) 94 159 (H)  CBGs, especially fasting, have been less than 70 mg/dl.Recommend decreasing Levemir to 15 units daily. Continue Novolog SENSITIVE correction scale every 4 hours while NPO or on tube feedings and then change to TID & HS if eating. Will continue to follow while in hospital. Smith Mince RN BSN CDE
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
## 2015-08-05 NOTE — Progress Notes (Signed)
Patient transferring to  6 [address] room 14.  Report called to Carrie Mew RN.  Will continue to monitor.

## 2015-08-05 NOTE — Progress Notes (Signed)
ANTICOAGULATION CONSULT NOTE  Pharmacy Consult for heparin Indication: AVR  Allergies  Allergen Reactions  . Celebrex [Celecoxib] Rash  . Detrol [Tolterodine] Hives    Patient Measurements: Height: 5\' 3"  (160 cm) Weight: 159 lb 9.8 oz (72.4 kg) IBW/kg (Calculated) : 52.4 Heparin Dosing Weight: 70 kg  Vital Signs: Temp: 98.6 F (37 C) (09/16 0700) Temp Source: Oral (09/16 0400) BP: 101/42 mmHg (09/16 0700) Pulse Rate: 64 (09/16 0700)  Labs:  Recent Labs  08/03/15 0424 08/03/15 0835  08/04/15 0350  08/04/15 1419 08/04/15 2155 08/05/15 0503  HGB 7.7*  --   --  7.2*  --   --   --  7.8*  HCT 24.8*  --   --  23.1*  --   --   --  24.1*  PLT 130*  --   --  167  --   --   --  210  LABPROT  --  19.4*  --   --   --   --   --   --   INR  --  1.64*  --   --   --   --   --   --   HEPARINUNFRC  --  <0.10*  < > 1.62*  < > 1.88* 1.28* 0.59  CREATININE 0.71  --   --  0.68  --   --   --  0.68  < > = values in this interval not displayed.  Estimated Creatinine Clearance: 76.7 mL/min (by C-G formula based on Cr of 0.68).   Medical History: Past Medical History  Diagnosis Date  . CAD (coronary artery disease)   . Obesity   . Hypercholesteremia   . HTN (hypertension)   . Dyslipidemia   . Aortic stenosis   . Thrombophlebitis   . Heart murmur   . Myocardial infarction 12/2014  . Pneumonia 11/2014; 12/2014  . GERD (gastroesophageal reflux disease)   . 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 Anxiety   . Depression   . Allergy   . CHF (congestive heart failure)   . Rheumatoid arthritis(714.0)     "hands" (07/01/2015)  . Diabetes dx'd 1982    "went straight to insulin"    Medications:  Prescriptions prior to admission  Medication Sig Dispense Refill Last Dose  . aspirin 81 MG tablet Take 1 tablet (81 mg total) by mouth daily. 30 tablet  unknown at unknown  . baclofen (LIORESAL) 10 MG tablet TAKE 1 TABLET  (10 MG TOTAL) BY MOUTH 2 (TWO) TIMES DAILY. 60 tablet 1 unknown at unknown  . clonazePAM (KLONOPIN) 0.5 MG tablet Take 1 tablet (0.5 mg total) by mouth 2 (two) times daily. 10 tablet 0 unknown at unknown  . escitalopram (LEXAPRO) 20 MG tablet Take 20 mg by mouth daily.   unknown at unknown  . HYDROcodone-acetaminophen (NORCO) 7.5-325 MG per tablet Take 1-2 tablets by mouth every 6 (six) hours as needed for moderate pain. 90 tablet 0 unknown at unknown  . insulin aspart (NOVOLOG) 100 UNIT/ML injection Inject 0-15 Units into the skin 3 (three) times daily with meals. 10 mL 11 unknown  . insulin detemir (LEVEMIR) 100 UNIT/ML injection Inject 0.18 mLs (18 Units total) into the skin 2 (two) times daily. 10 mL 11 unknown  . iron polysaccharides (NIFEREX) 150 MG capsule Take 150 mg by mouth daily.   unknown at unknown  . lisinopril (PRINIVIL,ZESTRIL) 2.5 MG  tablet TAKE 1 TABLET BY MOUTH EVERY DAY 30 tablet 5 unknown at unknown  . metoprolol tartrate (LOPRESSOR) 25 MG tablet Take 12.5 mg by mouth 2 (two) times daily.   unknown at unknown  . omeprazole (PRILOSEC) 40 MG capsule TAKE 1 CAPSULE (40 MG TOTAL) BY MOUTH DAILY. 30 capsule 6 unknown at unknown  . pravastatin (PRAVACHOL) 20 MG tablet TAKE 1 TABLET BY MOUTH EVERY DAY 90 tablet 0 unknown at Unknown time  . RHEUMATREX 2.5 MG tablet TAKE 1 TABLET 2 TIMES A WEEK (Patient taking differently: TAKE 1 TABLET 2 TIMES A WEEK (MON AND FRI)) 24 tablet 1 unknown at unknown  . solifenacin (VESICARE) 5 MG tablet Take 1 tablet (5 mg total) by mouth daily. 90 tablet 2 unknown at unknown  . traZODone (DESYREL) 50 MG tablet Take 0.5-1 tablets (25-50 mg total) by mouth at bedtime as needed for sleep. 10 tablet 0 unknown at unknown  . warfarin (COUMADIN) 5 MG tablet Take 5 mg by mouth daily.   unknown at unknown  . warfarin (COUMADIN) 7.5 MG tablet Take 7.5 mg by mouth See admin instructions. Only take 7.5mg  on wednesdays per [facility]   unknown at unknown  . zolpidem (AMBIEN)  10 MG tablet Take 10 mg by mouth at bedtime as needed. for sleep  1 unknown at unknown  . clopidogrel (PLAVIX) 75 MG tablet Take 75 mg by mouth daily.  2 Not Taking at Unknown time  . glucagon (GLUCAGON EMERGENCY) 1 MG injection Inject 1 mg into the vein once as needed. (Patient taking differently: Inject 1 mg into the vein daily as needed. ) 1 each 5 unknown at unknown  . HUMULIN N KWIKPEN 100 UNIT/ML Kiwkpen Inject 28 Units into the skin every morning.  11   . metoprolol tartrate (LOPRESSOR) 25 MG tablet Take 0.5 tablets (12.5 mg total) by mouth 2 (two) times daily. (Patient not taking: Reported on 08/01/2015) 60 tablet 0 Not Taking at Unknown time  . traMADol (ULTRAM) 50 MG tablet Take 50 mg by mouth at bedtime as needed.  0 Not Taking at Unknown time  . warfarin (COUMADIN) 5 MG tablet TAKE 1-1.5 BY MOUTH DAILY AT 6PM. TAKE 1 TAB DAILY TUES,TH,F,SAT & SUN. TAKE 1& 1/2 TAB ON MON &WED (Patient not taking: Reported on 08/01/2015) 60 tablet 0 Not Taking at Unknown time    Assessment: 54 yo female with hx of AVR on warfarin PTA, admitted with INR of 5.85 (no s/sx bleeding), pt received 3 units FFP on 9/13. Warfarin has been held since admission, therefore pt was started on heparin. Heparin level was supratherapeutic this morning.On 9/14 the pt lost IV access where heparin was infusing. All prior subtherapeutic levels were likely not accurate due to IV infiltration.   Heparin level is therapeutic for the first time. H/h 7.8/24, plts wnl  PTA warfarin dose: 7.5 mg qWed, 5 mg all other days  INR goal PTA 2.5-3.5  Goal of Therapy:  Heparin level 0.3-0.7 units/ml Monitor platelets by anticoagulation protocol: Yes   Plan:  -Continue heparin at 1000 units/hr -Daily HL, CBC -Check confirmatory level this afternoon -Monitor s/sx bleeding   Agapito Games, PharmD, BCPS Clinical Pharmacist Pager: [phone] 08/05/2015 7:25 AM

## 2015-08-05 NOTE — Progress Notes (Signed)
Arrival Method: via bed Mental Status: alert and oriented x 4 Telemetry: N/A Skin: intact, bruising bilateral cheeks Tubes: NA IV: R wrist Pain: 0/10 Family: Friend present Living Situation: home alone  Safety Measures: call bell in reach, bed alarm on, non skid socks on 6E Orientation: oriented to staff and unit
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
## 2015-08-05 NOTE — Progress Notes (Signed)
Inpatient Diabetes Program Recommendations  AACE/ADA: New Consensus Statement on Inpatient Glycemic Control (2015)  Target Ranges:  Prepandial:   less than 140 mg/dL      Peak postprandial:   less than 180 mg/dL (1-2 hours)      Critically ill patients:  140 - 180 mg/dL   Review of Glycemic Control   Inpatient Diabetes Program Recommendations:  CBGs have been less than 70 mg/dl. Recommend decreasing Lantus to 15 units daily and changing Novolog SENSITIVE correction scale to TID & HS if patient is eating now.  Will continue to follow. Smith Mince RN BSN CDE

## 2015-08-06 DIAGNOSIS — J9601 Acute respiratory failure with hypoxia: Secondary | ICD-10-CM

## 2015-08-06 DIAGNOSIS — Z7901 Long term (current) use of anticoagulants: Secondary | ICD-10-CM

## 2015-08-06 DIAGNOSIS — E1311 Other specified diabetes mellitus with ketoacidosis with coma: Secondary | ICD-10-CM

## 2015-08-06 LAB — GLUCOSE, CAPILLARY
GLUCOSE-CAPILLARY: 39 mg/dL — AB (ref 65–99)
GLUCOSE-CAPILLARY: 214 mg/dL — AB (ref 65–99)
Glucose-Capillary: 89 mg/dL (ref 65–99)
Glucose-Capillary: 265 mg/dL — ABNORMAL HIGH (ref 65–99)
Glucose-Capillary: 395 mg/dL — ABNORMAL HIGH (ref 65–99)
Glucose-Capillary: 315 mg/dL — ABNORMAL HIGH (ref 65–99)

## 2015-08-06 LAB — CBC
HEMATOCRIT: 28.7 % — AB (ref 36.0–46.0)
HEMOGLOBIN: 9.2 g/dL — AB (ref 12.0–15.0)
MCH: 26.4 pg (ref 26.0–34.0)
MCHC: 32.1 g/dL (ref 30.0–36.0)
MCV: 82.5 fL (ref 78.0–100.0)
Platelets: 289 10*3/uL (ref 150–400)
RBC: 3.48 MIL/uL — ABNORMAL LOW (ref 3.87–5.11)
RDW: 22.2 % — ABNORMAL HIGH (ref 11.5–15.5)
WBC: 6.2 10*3/uL (ref 4.0–10.5)

## 2015-08-06 MED ORDER — DEXTROSE 50 % IV SOLN
INTRAVENOUS | Status: AC
  Administered 2015-08-06: 10:00:00
  Filled 2015-08-06: qty 50

## 2015-08-06 MED ORDER — WARFARIN SODIUM 5 MG PO TABS
5.0000 mg | ORAL_TABLET | Freq: Once | ORAL | Status: AC
  Administered 2015-08-06: 5 mg via ORAL
  Filled 2015-08-06: qty 1

## 2015-08-06 MED ORDER — INSULIN DETEMIR 100 UNIT/ML ~~LOC~~ SOLN
14.0000 [IU] | Freq: Every day | SUBCUTANEOUS | Status: DC
  Administered 2015-08-06: 14 [IU] via SUBCUTANEOUS
  Filled 2015-08-06: qty 0.14

## 2015-08-06 MED ORDER — INSULIN ASPART 100 UNIT/ML ~~LOC~~ SOLN
0.0000 [IU] | Freq: Three times a day (TID) | SUBCUTANEOUS | Status: DC
  Administered 2015-08-06: 9 [IU] via SUBCUTANEOUS

## 2015-08-06 NOTE — Progress Notes (Signed)
Hypoglycemic Event  CBG: 39  Treatment: D50 IV 50 mL  Symptoms: None  Follow-up CBG: Time:1000 CBG Result: 214  Possible Reasons for Event: Unknown  Comments/MD notified: Dr. Randol Kern notified, orders received, will continue to monitor    Carrie Mew N  Remember to initiate Hypoglycemia Order Set & complete
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
## 2015-08-07 DIAGNOSIS — L8990 Pressure ulcer of unspecified site, unspecified stage: Secondary | ICD-10-CM

## 2015-08-07 DIAGNOSIS — J189 Pneumonia, unspecified organism: Secondary | ICD-10-CM

## 2015-08-07 DIAGNOSIS — J9600 Acute respiratory failure, unspecified whether with hypoxia or hypercapnia: Secondary | ICD-10-CM

## 2015-08-07 LAB — PROTIME-INR
INR: 1.15 (ref 0.00–1.49)
Prothrombin Time: 14.9 seconds (ref 11.6–15.2)

## 2015-08-07 LAB — GLUCOSE, CAPILLARY
GLUCOSE-CAPILLARY: 222 mg/dL — AB (ref 65–99)
GLUCOSE-CAPILLARY: 251 mg/dL — AB (ref 65–99)
GLUCOSE-CAPILLARY: 260 mg/dL — AB (ref 65–99)
Glucose-Capillary: 127 mg/dL — ABNORMAL HIGH (ref 65–99)
Glucose-Capillary: 319 mg/dL — ABNORMAL HIGH (ref 65–99)

## 2015-08-07 LAB — CBC
HEMATOCRIT: 29.2 % — AB (ref 36.0–46.0)
Hemoglobin: 9.3 g/dL — ABNORMAL LOW (ref 12.0–15.0)
MCH: 26.1 pg (ref 26.0–34.0)
MCHC: 31.8 g/dL (ref 30.0–36.0)
MCV: 82 fL (ref 78.0–100.0)
Platelets: 339 10*3/uL (ref 150–400)
RBC: 3.56 MIL/uL — ABNORMAL LOW (ref 3.87–5.11)
RDW: 22.1 % — AB (ref 11.5–15.5)
WBC: 6.5 10*3/uL (ref 4.0–10.5)

## 2015-08-07 MED ORDER — WARFARIN SODIUM 5 MG PO TABS
5.0000 mg | ORAL_TABLET | Freq: Once | ORAL | Status: AC
  Administered 2015-08-07: 5 mg via ORAL
  Filled 2015-08-07: qty 1

## 2015-08-07 MED ORDER — INSULIN DETEMIR 100 UNIT/ML ~~LOC~~ SOLN
18.0000 [IU] | Freq: Every day | SUBCUTANEOUS | Status: DC
  Administered 2015-08-07: 18 [IU] via SUBCUTANEOUS
  Filled 2015-08-07 (×2): qty 0.18

## 2015-08-07 MED ORDER — INSULIN ASPART 100 UNIT/ML ~~LOC~~ SOLN
0.0000 [IU] | Freq: Three times a day (TID) | SUBCUTANEOUS | Status: DC
  Administered 2015-08-07: 1 [IU] via SUBCUTANEOUS
  Administered 2015-08-07: 8 [IU] via SUBCUTANEOUS
  Administered 2015-08-07: 5 [IU] via SUBCUTANEOUS
  Administered 2015-08-08: 3 [IU] via SUBCUTANEOUS
  Administered 2015-08-08: 11 [IU] via SUBCUTANEOUS
  Administered 2015-08-09: 3 [IU] via SUBCUTANEOUS

## 2015-08-07 NOTE — Progress Notes (Addendum)
Patient Demographics  Madison [name], is a 54 y.o. female, DOB - 1961-06-27, AYT:[id number]  Admit date - 07/30/2015   Admitting Physician Chilton Greathouse, MD  Outpatient Primary MD for the patient is [name], NP  LOS - 8   Chief Complaint  Patient presents with  . Altered Mental Status       Brief narrative: 54 y/o F admitted on 9/10 after being found altered at home, incontinent of bowel/bladder, emesis and altered. Recent admission for LLE fracture with d/c to SNF. Found at home by [facility]. Required intubation for airway protection, extubated 9/16.   Subjective:   [name] today has, No headache, No chest pain, No abdominal pain - No Nausea, No new weakness tingling or numbness, No Cough - SOB.   Assessment & Plan    Active Problems:   DKA (diabetic ketoacidoses)   Diabetic ketoacidosis with coma associated with other specified diabetes mellitus   Pressure ulcer  Acute respiratory failure - Secondary to altered mental status and pneumonia - Resolved, currently on room air.  Pneumonia - Chest x-ray showing evidence of left upper lobe infiltrate on admission - Treated with IV vancomycin and Zosyn, currently off antibiotics - Blood cultures no growth to date  Diabetes mellitus with DKA on admission - CBGs at a very labile, yesterday hypoglycemic, ligament dose was adjusted, today he is hyperglycemic, Levemir was increased to 18 units subcutaneous at bedtime, and as a sliding scale changed from sensitive to moderate.  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 enoxaparin (LOVENOX) injection  70 mg Subcutaneous Q12H  . insulin aspart  0-15 Units Subcutaneous TID WC  . insulin detemir  18 Units Subcutaneous QHS  . pantoprazole  40 mg Oral Daily  . Warfarin - Pharmacist Dosing Inpatient   Does not apply q1800   Continuous Infusions:  PRN Meds:.acetaminophen, ondansetron (ZOFRAN) IV  DVT Prophylaxis  Lovenox/warfarin  Lab Results  Component Value Date   PLT 339 08/07/2015    Antibiotics   Anti-infectives    Start     Dose/Rate Route Frequency Ordered Stop   08/02/15 2300  vancomycin (VANCOCIN) IVPB 1000 mg/200 mL premix  Status:  Discontinued     1,000 mg 200 mL/hr over 60 Minutes Intravenous Every 12 hours 08/02/15 1134 08/04/15 1153   07/30/15 2300  vancomycin (VANCOCIN) IVPB 750 mg/150 ml premix  Status:  Discontinued     750 mg 150 mL/hr over 60 Minutes Intravenous Every 12 hours 07/30/15 1009 08/02/15 1134   07/30/15 1700  piperacillin-tazobactam (ZOSYN) IVPB 3.375 g     3.375 g 12.5 mL/hr over 240 Minutes Intravenous Every 8 hours 07/30/15 1009 08/05/15 2029   07/30/15 0945  piperacillin-tazobactam (ZOSYN) IVPB  3.375 g     3.375 g 100 mL/hr over 30 Minutes Intravenous  Once 07/30/15 0940 07/30/15 1130   07/30/15 0945  vancomycin (VANCOCIN) IVPB 1000 mg/200 mL premix  Status:  Discontinued     1,000 mg 200 mL/hr over 60 Minutes Intravenous  Once 07/30/15 0940 07/30/15 0943   07/30/15 0945  vancomycin (VANCOCIN) 1,500 mg in sodium chloride 0.9 % 500 mL IVPB     1,500 mg 250 mL/hr over 120 Minutes Intravenous  Once 07/30/15 0943 07/30/15 1313          Objective:   Filed Vitals:   08/06/15 1641 08/06/15 1938 08/07/15 0441 08/07/15 0842  BP: 118/62 113/52 114/54 124/56  Pulse: 72 97 75  73  Temp: 98.8 F (37.1 C) 99.3 F (37.4 C) 98.1 F (36.7 C) 98.4 F (36.9 C)  TempSrc: Oral   Oral  Resp: 18 18 18 18   Height:      Weight:  68.493 kg (151 lb)    SpO2: 100% 100% 99% 100%    Wt Readings from Last 3 Encounters:  08/06/15 68.493 kg (151 lb)  07/08/15 73.936 kg (163 lb)  06/16/15 74.118 kg (163 lb 6.4 oz)     Intake/Output Summary (Last 24 hours) at 08/07/15 1407 Last data filed at 08/07/15 1100  Gross per 24 hour  Intake   2040 ml  Output   3675 ml  Net  -1635 ml     Physical Exam  Awake Alert, Oriented ,  pressure ulcer in cheeks. [place].AT,PERRAL Supple Neck,No JVD,  Symmetrical Chest wall movement, Good air movement bilaterally,  No Gallops, S1, S2  + +ve B.Sounds, Abd Soft, No tenderness, No organomegaly appriciated, No rebound - guarding or rigidity. No Cyanosis, clubbing or edema   Data Review   Micro Results Recent Results (from the past 240 hour(s))  Culture, blood (routine x 2)     Status: None   Collection Time: 07/30/15 10:00 AM  Result Value Ref Range Status   Specimen Description BLOOD RIGHT HAND  Final   Special Requests BOTTLES DRAWN AEROBIC ONLY 2CC  Final   Culture NO GROWTH 5 DAYS  Final   Report Status 08/04/2015 FINAL  Final  Culture, blood (routine x 2)     Status: None   Collection Time: 07/30/15 10:10 AM  Result Value Ref Range Status   Specimen Description BLOOD LEFT WRIST  Final   Special Requests BOTTLES DRAWN AEROBIC ONLY 3CC  Final   Culture NO GROWTH 5 DAYS  Final   Report Status 08/04/2015 FINAL  Final  Urine culture     Status: None   Collection Time: 07/30/15 10:20 AM  Result Value Ref Range Status   Specimen Description URINE, CATHETERIZED  Final   Special Requests NONE  Final   Culture NO GROWTH 1 DAY  Final   Report Status 07/31/2015 FINAL  Final  MRSA PCR Screening     Status: None   Collection Time: 07/30/15  1:58 PM  Result Value Ref Range Status   MRSA by PCR NEGATIVE NEGATIVE Final    Comment:         The GeneXpert MRSA Assay (FDA approved for NASAL specimens only), is one component of a comprehensive MRSA colonization surveillance program. It is not intended to diagnose MRSA infection nor to guide or monitor treatment for MRSA infections.   Culture, Urine     Status: None   Collection Time: 07/30/15  2:15 PM  Result Value Ref Range Status  Specimen Description URINE, CATHETERIZED  Final   Special Requests NONE  Final   Culture NO GROWTH 1 DAY  Final   Report Status 07/31/2015 FINAL  Final  C difficile quick scan w PCR reflex     Status: None   Collection Time: 08/04/15  2:57 PM  Result Value Ref Range Status   C Diff antigen NEGATIVE NEGATIVE Final   C Diff toxin NEGATIVE NEGATIVE Final   C Diff interpretation Negative for toxigenic C. difficile  Final    Radiology Reports Ct Head Wo Contrast  07/30/2015   CLINICAL DATA:  Found on floor, responsive to pain only.  EXAM: CT HEAD WITHOUT CONTRAST  CT CERVICAL SPINE WITHOUT CONTRAST  TECHNIQUE: Multidetector CT imaging of the head and cervical spine was performed following the standard protocol without intravenous contrast. Multiplanar CT image reconstructions of the cervical spine were also generated.  COMPARISON:  01/17/2015  FINDINGS: CT HEAD FINDINGS  There is extensive encephalomalacia in the right frontal lobe, right parietal lobe, and right occipital lobe. There is similar but less extensive encephalomalacia in the left parietal and occipital lobes. Chronic ischemic changes in the periventricular white matter are noted. There is no mass effect, midline shift, or acute hemorrhage. These findings are unchanged. Minimal layering mucus in the right maxillary sinus. Paranasal sinuses are otherwise clear. There is fluid in the left mastoid air cells. The cranium is intact.  CT CERVICAL SPINE FINDINGS  Failure of fusion of the posterior C1 arch is a normal variation. There is no fracture or dislocation. There is no obvious soft tissue  injury. There is no obvious spinal hematoma. There is a 2.0 x 1.7 cm soft tissue area in the right anterior triangle. Abnormal adenopathy is not excluded.  IMPRESSION: No acute intracranial pathology. Chronic ischemic changes are noted.  Nonspecific fluid in the left mastoid air cells.  No evidence of cervical spine injury.  Possible right cervical adenopathy as described. CT neck with contrast is recommended   Electronically Signed   By: Jolaine Click M.D.   On: 07/30/2015 13:13   Ct Cervical Spine Wo Contrast  07/30/2015   CLINICAL DATA:  Found on floor, responsive to pain only.  EXAM: CT HEAD WITHOUT CONTRAST  CT CERVICAL SPINE WITHOUT CONTRAST  TECHNIQUE: Multidetector CT imaging of the head and cervical spine was performed following the standard protocol without intravenous contrast. Multiplanar CT image reconstructions of the cervical spine were also generated.  COMPARISON:  01/17/2015  FINDINGS: CT HEAD FINDINGS  There is extensive encephalomalacia in the right frontal lobe, right parietal lobe, and right occipital lobe. There is similar but less extensive encephalomalacia in the left parietal and occipital lobes. Chronic ischemic changes in the periventricular white matter are noted. There is no mass effect, midline shift, or acute hemorrhage. These findings are unchanged. Minimal layering mucus in the right maxillary sinus. Paranasal sinuses are otherwise clear. There is fluid in the left mastoid air cells. The cranium is intact.  CT CERVICAL SPINE FINDINGS  Failure of fusion of the posterior C1 arch is a normal variation. There is no fracture or dislocation. There is no obvious soft tissue injury. There is no obvious spinal hematoma. There is a 2.0 x 1.7 cm soft tissue area in the right anterior triangle. Abnormal adenopathy is not excluded.  IMPRESSION: No acute intracranial pathology. Chronic ischemic changes are noted.  Nonspecific fluid in the left mastoid air cells.  No evidence of cervical spine  injury.  Possible right cervical adenopathy  as described. CT neck with contrast is recommended   Electronically Signed   By: Jolaine Click M.D.   On: 07/30/2015 13:13   Dg Chest Port 1 View  08/04/2015   CLINICAL DATA:  Respiratory failure.  EXAM: PORTABLE CHEST - 1 VIEW  COMPARISON:  08/03/2015.  FINDINGS: Endotracheal tube tip at the right mainstem bronchus orifice. Proximal repositioning of approximately 3 cm suggested. NG tube in stable position. Mediastinum hilar structures are unremarkable. Questionable mild left upper lobe infiltrate remains. Mild left base subsegmental atelectasis. Prior cardiac valve replacement. Heart size normal. No pleural effusion or pneumothorax.  IMPRESSION: 1. Endotracheal tube tip at the right mainstem bronchus orifice. Proximal repositioning of approximately 3 cm suggested. NG tube in stable position. 2. Questionable mild left upper lobe infiltrate remains. Mild left base subsegmental atelectasis. Critical Value/emergent results were called by telephone at the time of interpretation on 08/04/2015 at 7:12 am to nurse Lexy, who verbally acknowledged these results.   Electronically Signed   By: Maisie Fus  Register   On: 08/04/2015 07:14   Dg Chest Port 1 View  08/03/2015   CLINICAL DATA:  Acute respiratory failure.  Shortness of breath.  EXAM: PORTABLE CHEST - 1 VIEW  COMPARISON:  08/02/2015.  FINDINGS: Endotracheal tube 1.7 cm above the carina. Proximal repositioning of approximately 1-2 cm should be considered. NG tube noted in stable position. Prior median sternotomy and cardiac valve replacement. Heart size normal. Mild infiltrate left upper lobe cannot be excluded on today's exam. Interim clearing of left base subsegmental atelectasis. No significant pleural effusion. No pneumothorax.  IMPRESSION: 1. Endotracheal tube 1.7 cm above the carina. Proximal repositioning of approximately 1-2 cm should be considered. NG tube in stable position. 2. Prior median sternotomy and cardiac  valve replacement. Heart size normal. 3. Mild infiltrate left upper lobe. Interim clearing of left base subsegmental atelectasis.   Electronically Signed   By: Maisie Fus  Register   On: 08/03/2015 07:12   Dg Chest Port 1 View  08/02/2015   CLINICAL DATA:  Advancement of the endotracheal tube  EXAM: PORTABLE CHEST - 1 VIEW  COMPARISON:  08/02/2015  FINDINGS: Endotracheal tube is near the carina, approximately 1 cm above the carina. There is left base atelectasis, stable. Right lung is clear. Heart is normal size. Prior median sternotomy and valve replacement. No visible effusions or acute bony abnormality.  IMPRESSION: Endotracheal tube near the carina, approximately 1 cm above the carina.  Left base atelectasis.   Electronically Signed   By: Charlett Nose M.D.   On: 08/02/2015 08:55   Dg Chest Port 1 View  08/02/2015   CLINICAL DATA:  Acute respiratory failure.  EXAM: PORTABLE CHEST - 1 VIEW  COMPARISON:  08/01/2015.  FINDINGS: Endotracheal tube noted with tip 1.3 cm above the carina. Proximal repositioning of 1-2 cm suggested. NG tube in stable position. Heart size normal. Aortic valve replacement. Left lower lobe subsegmental atelectasis and or mild infiltrate. No pleural effusion or pneumothorax.  IMPRESSION: 1. Endotracheal tube tip noted 1.3 cm above the carina. Proximal repositioning of 1 of 2 cm suggested. NG tube in stable position. 2. Mild left lower lobe subsegmental atelectasis and or infiltrate. 3. Prior cardiac valve replacement.  Heart size normal. These results will be called to the ordering clinician or representative by the Radiologist Assistant, and communication documented in the PACS or zVision Dashboard.   Electronically Signed   By: Maisie Fus  Register   On: 08/02/2015 07:17   Dg Chest [facility]  08/01/2015   CLINICAL DATA:  Respiratory failure.  EXAM: PORTABLE CHEST - 1 VIEW  COMPARISON:  07/31/2015.  FINDINGS: The cardiac silhouette, mediastinal and hilar contours are within normal limits  and stable. The endotracheal tube and NG tubes are stable. The lungs are clear. No pleural effusion or pneumothorax.  IMPRESSION: Stable support apparatus.  No acute pulmonary findings.   Electronically Signed   By: Rudie Meyer M.D.   On: 08/01/2015 12:43   Dg Chest Port 1 View  07/31/2015   CLINICAL DATA:  Acute respiratory failure  EXAM: PORTABLE CHEST - 1 VIEW  COMPARISON:  07/30/2015  FINDINGS: Endotracheal and nasogastric tubes are appropriately positioned. Evidence of median sternotomy/aortic valvuloplasty. Lungs are clear. Heart size normal.  IMPRESSION: No focal acute finding.   Electronically Signed   By: Christiana Pellant M.D.   On: 07/31/2015 09:21   Dg Chest Portable 1 View  07/30/2015   CLINICAL DATA:  Found unresponsive  EXAM: PORTABLE CHEST - 1 VIEW  COMPARISON:  07/01/2015  FINDINGS: Endotracheal and nasogastric tubes are appropriately positioned. Evidence of aortic valvuloplasty. Heart size normal. The lungs are clear.  IMPRESSION: No acute cardiopulmonary process.   Electronically Signed   By: Christiana Pellant M.D.   On: 07/30/2015 10:03     CBC  Recent Labs Lab 08/03/15 0424 08/04/15 0350 08/05/15 0503 08/06/15 1035 08/07/15 0545  WBC 7.8 9.3 7.6 6.2 6.5  HGB 7.7* 7.2* 7.8* 9.2* 9.3*  HCT 24.8* 23.1* 24.1* 28.7* 29.2*  PLT 130* 167 210 289 339  MCV 80.3 79.7 81.1 82.5 82.0  MCH 24.9* 24.8* 26.3 26.4 26.1  MCHC 31.0 31.2 32.4 32.1 31.8  RDW 21.9* 21.6* 21.5* 22.2* 22.1*    Chemistries   Recent Labs Lab 08/01/15 0530 08/02/15 0424 08/03/15 0424 08/04/15 0350 08/05/15 0503  NA 143 141 139 138 139  K 3.3* 4.1 3.6 3.9 3.7  CL 113* 112* 106 104 105  CO2 23 25 25 26 28   GLUCOSE 151* 220* 137* 62* 59*  BUN 10 18 21* 21* 12  CREATININE 0.87 0.70 0.71 0.68 0.68  CALCIUM 8.2* 8.2* 8.4* 8.6* 9.1  MG  --  2.0  --   --  1.9   ------------------------------------------------------------------------------------------------------------------ estimated creatinine  clearance is 74.6 mL/min (by C-G formula based on Cr of 0.68). ------------------------------------------------------------------------------------------------------------------ No results for input(s): HGBA1C in the last 72 hours. ------------------------------------------------------------------------------------------------------------------ No results for input(s): CHOL, HDL, LDLCALC, TRIG, CHOLHDL, LDLDIRECT in the last 72 hours. ------------------------------------------------------------------------------------------------------------------ No results for input(s): TSH, T4TOTAL, T3FREE, THYROIDAB in the last 72 hours.  Invalid input(s): FREET3 ------------------------------------------------------------------------------------------------------------------ No results for input(s): VITAMINB12, FOLATE, FERRITIN, TIBC, IRON, RETICCTPCT in the last 72 hours.  Coagulation profile  Recent Labs Lab 08/01/15 0530 08/02/15 0424 08/03/15 0835 08/07/15 0545  INR 2.75* 2.32* 1.64* 1.15    No results for input(s): DDIMER in the last 72 hours.  Cardiac Enzymes No results for input(s): CKMB, TROPONINI, MYOGLOBIN in the last 168 hours.  Invalid input(s): CK ------------------------------------------------------------------------------------------------------------------ Invalid input(s): POCBNP     Time Spent in minutes   30 minutes   ELGERGAWY, DAWOOD M.D on 08/07/2015 at 2:07 PM  Between 7am to 7pm - Pager - [phone]  After 7pm go to [email or web address] - password [facility]  [facility]   Office  [phone]

## 2015-08-07 NOTE — Consult Note (Signed)
WOC wound consult note Reason for Consult: Eschar on bilateral sides of face Wound type:Medical Device Related Pressure Injury (MDRPI) due to endotracheal tube securement in a critically ill patient Pressure Ulcer POA:No Measurement: Left:0.8cm x 1.5cm  Right:  1cm x 1.5cm. Depth of injury is obscured by the presence of eschar.  Wound bed: Dry, stable eschar lifting at periphery Drainage (amount, consistency, odor) None Periwound: Intact, dry, no erythema, induration or warmth Dressing procedure/placement/frequency: I will leave these tow areas open to air at this time.  I suggest/recommend Plastic Surgery consultation as these are facial injuries and we would wish to minimize potential for scar in wound repair and regeneration. Other typical topical therapies wound be to autolytically debride and/or soften this eschar however, Plastics may wish to remove these in their dry state vs. a softened state. WOC nursing team will not follow, but will remain available to this patient, the nursing and medical teams.  Please re-consult if needed. Thanks, Ladona Mow, MSN, RN, GNP, [place], CWON-AP [phone])

## 2015-08-07 NOTE — Care Management (Addendum)
CM received call from [facility] at [facility] stating  that [facility] will could not accept this referral at this time. CM discussed with patient, selected [facility] HH agency. Referral called in to [facility] for [facility].referral accepted.  Contacted Dr. Randol Kern concerning  HH orders, states, he will address in the am. Unit CM will follow up in the am.

## 2015-08-07 NOTE — Care Management Note (Addendum)
Case Management Note  Patient Details  Name: Madison [name] MRN: [id number] Date of Birth: 1961-10-04  Subjective/Objective:      Admitted for DKA, Fracture to LLE               Action/Plan:  PT Recommendation is for SNF placement, patient declined, Informed CSW that she will return home. As per CSW patient has a HHA  Who comes daily and  assist with personal care and household chores. CM met with patient at bedside regarding patient's request for [facility] services. Patient states, her sister is her support system comes in as well and checks on her daily. Discussed HH services, and DME patient requesting a bedside commode, w/c and rolling walker.Offered choice of agencies, patient selected [facility] she stated she has used the agency in the past. Verified contact information and address where services will be rendered. Referral for [facility] RN,PT,/OT/HHA called in to [place] at [facility], referral verbally accepted. DME referral also called in to [facility],, will be delivered to room  awaiting placement of  [place] orders by MD. Patient is amendable to discharge plan.  Expected Discharge Date:     08/09/15             Expected Discharge Plan:  [place]  In-House Referral:  Clinical Social Work  Discharge planning Services  CM Consult, [facility]  Post Acute Care Choice:  Durable Medical Equipment Choice offered to:  Patient  DME Arranged:  3-N-1, Walker rolling wheelchair manual DME Agency:  [place]:  RN, PT, OT, Nurse's Aide, Social Work [employer] Agency:  [facility]  Status of Service:  In process, will continue to follow  Medicare Important Message Given:  Yes-second notification given Date Medicare IM Given:    Medicare IM give by:    Date Additional Medicare IM Given:    Additional Medicare Important Message give by:     If discussed at [place] of Stay Meetings, dates discussed:    Additional CommentsLaurena Slimmer,  RN 08/07/2015, 8:43 PM

## 2015-08-07 NOTE — Progress Notes (Signed)
CSW (Clinical Child psychotherapist) visited pt room to discuss SNF recommendation. Pt politely declined SNF. She informed CSW she will return home. Pt lives alone but reports that she has an Engineer, production from company: [employer] that comes in daily for as long as needed to assist with personal care and household chores. Pt also reports she has had home health with [facility] before and is agreeable to having their services in home again. Pt requesting bedside commode. CSW explained that RNCM would assist with home needs but CSW would relay pt request. Pt is requesting transportation assistance at dc. Pt report she normally has assistance from her sister with transportation but does not feel comfortable with this until boot is removed. Pt made aware that coverage of non-emergent ambulance is not guaranteed with pt insurance. Pt agreeable to non-emergent ambulance to dc home. Her sister will be able to meet her at home to let her into her home.  [place] [facility] [employer] Week[employer] CSW [phone]

## 2015-08-07 NOTE — Progress Notes (Signed)
ANTICOAGULATION CONSULT NOTE - Follow Up Consult  Pharmacy Consult for coumadin and lovenox Indication: prosthetic AVR and multiple CVAs  Allergies  Allergen Reactions  . Celebrex [Celecoxib] Rash  . Detrol [Tolterodine] Hives    Patient Measurements: Height: 5\' 3"  (160 cm) Weight: 151 lb (68.493 kg) IBW/kg (Calculated) : 52.4 Heparin Dosing Weight:   Vital Signs: Temp: 98.4 F (36.9 C) (09/18 0842) Temp Source: Oral (09/18 0842) BP: 124/56 mmHg (09/18 0842) Pulse Rate: 73 (09/18 0842)  Labs:  Recent Labs  08/04/15 2155  08/05/15 0503 08/06/15 1035 08/07/15 0545  HGB  --   < > 7.8* 9.2* 9.3*  HCT  --   --  24.1* 28.7* 29.2*  PLT  --   --  210 289 339  LABPROT  --   --   --   --  14.9  INR  --   --   --   --  1.15  HEPARINUNFRC 1.28*  --  0.59  --   --   CREATININE  --   --  0.68  --   --   < > = values in this interval not displayed.  Estimated Creatinine Clearance: 74.6 mL/min (by C-G formula based on Cr of 0.68).   Medications:  Scheduled:  . enoxaparin (LOVENOX) injection  70 mg Subcutaneous Q12H  . insulin aspart  0-15 Units Subcutaneous TID WC  . insulin detemir  18 Units Subcutaneous QHS  . pantoprazole  40 mg Oral Daily  . Warfarin - Pharmacist Dosing Inpatient   Does not apply q1800   Infusions:    Assessment: 54 yo female with hx of prosthetic AVr and multiple CVAs is currently on subtherapeutic coumadin bridging with treatment dose of lovenox.  INR is 1.15, Hgb 9.3 and Plt 339 K stable.  Got FFP on 09/13.  Goal of Therapy:  INR 2.5-3.5 Monitor platelets by anticoagulation protocol: Yes   Plan:  -Cont Lovenox 70 mg [place] q12h -Warfarin 5 mg po x1 -Daily INR, CBC q72h -Monitor s/sx bleeding   Teoman Giraud, Tsz-Yin 08/07/2015,2:38 PM

## 2015-08-08 ENCOUNTER — Ambulatory Visit (HOSPITAL_COMMUNITY): Payer: Medicaid Other

## 2015-08-08 ENCOUNTER — Telehealth: Payer: Self-pay

## 2015-08-08 ENCOUNTER — Encounter (HOSPITAL_COMMUNITY): Payer: Self-pay | Admitting: Plastic Surgery

## 2015-08-08 DIAGNOSIS — S0180XA Unspecified open wound of other part of head, initial encounter: Secondary | ICD-10-CM

## 2015-08-08 LAB — PROTIME-INR
INR: 1.18 (ref 0.00–1.49)
Prothrombin Time: 15.2 seconds (ref 11.6–15.2)

## 2015-08-08 LAB — CBC
HEMATOCRIT: 31.3 % — AB (ref 36.0–46.0)
Hemoglobin: 10 g/dL — ABNORMAL LOW (ref 12.0–15.0)
MCH: 25.8 pg — ABNORMAL LOW (ref 26.0–34.0)
MCHC: 31.9 g/dL (ref 30.0–36.0)
MCV: 80.7 fL (ref 78.0–100.0)
Platelets: UNDETERMINED 10*3/uL (ref 150–400)
RBC: 3.88 MIL/uL (ref 3.87–5.11)
RDW: 22.3 % — AB (ref 11.5–15.5)
WBC: 6.7 10*3/uL (ref 4.0–10.5)

## 2015-08-08 LAB — GLUCOSE, CAPILLARY
GLUCOSE-CAPILLARY: 245 mg/dL — AB (ref 65–99)
Glucose-Capillary: 317 mg/dL — ABNORMAL HIGH (ref 65–99)
Glucose-Capillary: 196 mg/dL — ABNORMAL HIGH (ref 65–99)

## 2015-08-08 LAB — PREALBUMIN: Prealbumin: 17.5 mg/dL — ABNORMAL LOW (ref 18–38)

## 2015-08-08 MED ORDER — INSULIN DETEMIR 100 UNIT/ML ~~LOC~~ SOLN
26.0000 [IU] | Freq: Every day | SUBCUTANEOUS | Status: DC
  Administered 2015-08-08: 26 [IU] via SUBCUTANEOUS
  Filled 2015-08-08 (×2): qty 0.26

## 2015-08-08 MED ORDER — MUPIROCIN 2 % EX OINT
TOPICAL_OINTMENT | Freq: Two times a day (BID) | CUTANEOUS | Status: DC
  Administered 2015-08-08 – 2015-08-09 (×2): via NASAL
  Filled 2015-08-08: qty 22

## 2015-08-08 MED ORDER — WARFARIN SODIUM 7.5 MG PO TABS
7.5000 mg | ORAL_TABLET | Freq: Once | ORAL | Status: AC
  Administered 2015-08-08: 7.5 mg via ORAL
  Filled 2015-08-08: qty 1

## 2015-08-08 NOTE — Consult Note (Signed)
Reason for Consult:facial wounds Referring Physician: Dr. Mliss Fritz Elgergawy  Madison [name] is an 54 y.o. female.  HPI: The patient is a 54 yrs old wf here for multiple medical conditions.  The consult is for facial wounds.  The report indicates that the patient was intubated and there was some irritation from the tape from the ET tube.  She has been placing neosporin on the area. There is no sign of infection, redness or drainage.  There is an escar on each cheek.  Past Medical History  Diagnosis Date  . CAD (coronary artery disease)   . Obesity   . Hypercholesteremia   . HTN (hypertension)   . Dyslipidemia   . Aortic stenosis   . Thrombophlebitis   . Heart murmur   . Myocardial infarction 12/2014  . Pneumonia 11/2014; 12/2014  . GERD (gastroesophageal reflux disease)   . 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 Anxiety   . Depression   . Allergy   . CHF (congestive heart failure)   . Rheumatoid arthritis(714.0)     "hands" (07/01/2015)  . Diabetes dx'd 1982    "went straight to insulin"    Past Surgical History  Procedure Laterality Date  . Cesarean section  1995  . Foot fracture surgery    . Knee arthroscopy Right   . Neuroplasty / transposition median nerve at carpal tunnel    . Left heart catheterization with coronary angiogram N/A 12/08/2014    Procedure: LEFT HEART CATHETERIZATION WITH CORONARY ANGIOGRAM;  Surgeon: Iran Ouch, MD;  Location: [facility] CATH LAB;  Service: Cardiovascular;  Laterality: N/A;  . Percutaneous coronary stent intervention (pci-s) N/A 12/09/2014    Procedure: PERCUTANEOUS CORONARY STENT INTERVENTION (PCI-S);  Surgeon: [name], MD;  Location: [facility] CATH LAB;  Service: Cardiovascular;  Laterality: N/A;  . Cardiac catheterization  12/08/2014  . Coronary angioplasty with stent placement  12/09/2014  . Aortic valve replacement (avr)/coronary artery bypass grafting (cabg)  "2014"   [name] 03/08/2014  . Fracture surgery    . Tubal ligation  1995  . Cataract extraction Left   . Coronary artery bypass graft    . Orif ankle fracture Left 07/04/2015    Procedure: OPEN REDUCTION INTERNAL FIXATION (ORIF)  TRIMAL ANKLE FRACTURE;  Surgeon: [name], MD;  Location: [facility] OR;  Service: Orthopedics;  Laterality: Left;    Family History  Problem Relation Age of Onset  . Stroke    . Heart disease Mother   . Heart disease Sister     Social History:  reports that she has never smoked. She has never used smokeless tobacco. She reports that she does not drink alcohol or use illicit drugs.  Allergies:  Allergies  Allergen Reactions  . Celebrex [Celecoxib] Rash  . Detrol [Tolterodine] Hives    Medications: I have reviewed the patient's current medications.  Results for orders placed or performed during the hospital encounter of 07/30/15 (from the past 48 hour(s))  Glucose, capillary     Status: Abnormal   Collection Time: 08/06/15  7:33 PM  Result Value Ref Range   Glucose-Capillary 315 (H) 65 - 99 mg/dL  Glucose, capillary     Status: Abnormal   Collection Time: 08/06/15 11:49 PM  Result Value Ref Range   Glucose-Capillary 251 (H) 65 - 99 mg/dL  CBC     Status: Abnormal   Collection Time: 08/07/15  5:45 AM  Result Value Ref Range   WBC 6.5 4.0 - 10.5 K/uL   RBC 3.56 (L) 3.87 - 5.11 MIL/uL   Hemoglobin 9.3 (L) 12.0 - 15.0 g/dL   HCT 74.0 (L) 81.4 - 48.1 %   MCV 82.0 78.0 - 100.0 fL   MCH 26.1 26.0 - 34.0 pg   MCHC 31.8 30.0 - 36.0 g/dL   RDW 85.6 (H) 31.4 - 97.0 %   Platelets 339 150 - 400 K/uL  Protime-INR     Status: None   Collection Time: 08/07/15  5:45 AM  Result Value Ref Range   Prothrombin Time 14.9 11.6 - 15.2 seconds   INR 1.15 0.00 - 1.49  Glucose, capillary     Status: Abnormal   Collection Time: 08/07/15  8:40 AM  Result Value Ref Range   Glucose-Capillary 127 (H) 65 - 99 mg/dL  Glucose, capillary     Status: Abnormal   Collection Time:  08/07/15 11:40 AM  Result Value Ref Range   Glucose-Capillary 260 (H) 65 - 99 mg/dL  Glucose, capillary     Status: Abnormal   Collection Time: 08/07/15  4:41 PM  Result Value Ref Range   Glucose-Capillary 222 (H) 65 - 99 mg/dL  Glucose, capillary     Status: Abnormal   Collection Time: 08/07/15  9:23 PM  Result Value Ref Range   Glucose-Capillary 319 (H) 65 - 99 mg/dL  CBC     Status: Abnormal   Collection Time: 08/08/15  5:06 AM  Result Value Ref Range   WBC 6.7 4.0 - 10.5 K/uL    Comment: WHITE COUNT CONFIRMED ON SMEAR   RBC 3.88 3.87 - 5.11 MIL/uL   Hemoglobin 10.0 (L) 12.0 - 15.0 g/dL   HCT 26.3 (L) 78.5 - 88.5 %   MCV 80.7 78.0 - 100.0 fL   MCH 25.8 (L) 26.0 - 34.0 pg   MCHC 31.9 30.0 - 36.0 g/dL   RDW 02.7 (H) 74.1 - 28.7 %   Platelets PLATELET CLUMPS NOTED ON SMEAR, UNABLE TO ESTIMATE 150 - 400 K/uL  Protime-INR     Status: None   Collection Time: 08/08/15  6:25 AM  Result Value Ref Range   Prothrombin Time 15.2 11.6 - 15.2 seconds   INR 1.18 0.00 - 1.49  Glucose, capillary     Status: Abnormal   Collection Time: 08/08/15  7:22 AM  Result Value Ref Range   Glucose-Capillary 245 (H) 65 - 99 mg/dL  Glucose, capillary     Status: Abnormal   Collection Time: 08/08/15 11:54 AM  Result Value Ref Range   Glucose-Capillary 317 (H) 65 - 99 mg/dL  Glucose, capillary     Status: Abnormal   Collection Time: 08/08/15  4:38 PM  Result Value Ref Range   Glucose-Capillary 196 (H) 65 - 99 mg/dL    No results found.  Review of Systems  Constitutional: Negative.   HENT: Negative.   Eyes: Negative.   Respiratory: Negative.   Cardiovascular: Negative.   Gastrointestinal: Negative.   Genitourinary: Negative.   Musculoskeletal: Negative.   Skin: Negative.   Neurological: Negative.   Psychiatric/Behavioral: Negative.    Blood pressure 109/51, pulse 69, temperature 98.5 F (36.9 C), temperature source Oral, resp. rate 17, height 5\' 3"  (1.6 m), weight 68.7 kg (151 lb 7.3 oz),  SpO2 100 %. Physical Exam  Constitutional: She is oriented to person, place, and time. She appears well-developed and well-nourished.  HENT:  Head: Normocephalic.    Eyes: Conjunctivae and EOM  are normal. Pupils are equal, round, and reactive to light.  Respiratory: Effort normal.  Neurological: She is alert and oriented to person, place, and time.  Psychiatric: She has a normal mood and affect. Her behavior is normal. Judgment and thought content normal.    Assessment/Plan: Clean with soap daily.  Apply bactroban on the area daily.  Check prealbumin, maximize nutritional status with multivitamin, Vit C and Zinc daily. Follow up in office.  This will take time to heal and the patient is aware of the plan.  SANGER,CLAIRE 08/08/2015, 5:31 PM

## 2015-08-08 NOTE — Consult Note (Signed)
   [facility] CM Inpatient Consult   08/08/2015  Madison [name] [birth year] [id number]   Came to bedside to speak with patient about [facility] Care Management services. She had been active in the past with [facility] but had since been discharged. She is familiar with the [facility] Care Management program and endorses she would like to be followed again. She has had a prolonged hospital course. She continues to pleasantly decline SNF placement. She states she plans on going to stay with her sister for about 2 weeks or so after discharge. Consent signed again. Left [facility] Care Management packet at bedside. Explained that [facility] Care Management will not interfere or replace services provided by home health. Confirmed with inpatient RNCM that she will have [facility] home health services. Confirmed patient's sister's address as [address], [place], [place] [address]. Confirmed Ms Reckart's cell number as [phone]. Confirmed her sister Tracy's cell number as [phone]. Will request patient to be assigned to community [facility] RNCM.  Raiford Noble, MSN-Ed, RN,BSN [facility] Liaison [phone]

## 2015-08-08 NOTE — Telephone Encounter (Signed)
noted 

## 2015-08-08 NOTE — Progress Notes (Signed)
Patient Demographics  Madison [name], is a 54 y.o. female, DOB - [birth year], WNI:[id number]  Admit date - 07/30/2015   Admitting Physician Chilton Greathouse, MD  Outpatient Primary MD for the patient is [name], NP  LOS - 9   Chief Complaint  Patient presents with  . Altered Mental Status       Brief narrative: 54 y/o F admitted on 9/10 after being found altered at home, incontinent of bowel/bladder, emesis and altered. Recent admission for LLE fracture with d/c to SNF. Found at home by [facility]. Required intubation for airway protection, extubated 9/16.   Subjective:   Madison [name] today has, No headache, No chest pain, No abdominal pain - No Nausea, No new weakness tingling or numbness, No Cough - SOB.   Assessment & Plan    Active Problems:   CAD (coronary artery disease)   Chronic anticoagulation   Acute respiratory failure   DKA (diabetic ketoacidoses)   Diabetic ketoacidosis with coma associated with other specified diabetes mellitus   Pressure ulcer  Acute respiratory failure - Secondary to altered mental status and pneumonia, required intubation initially, successfully extubated. - Resolved, currently on room air.  Pneumonia - Chest x-ray showing evidence of left upper lobe infiltrate on admission - Treated with IV vancomycin and Zosyn, currently off antibiotics - Blood cultures no growth to date  Diabetes mellitus with DKA on admission - CBGs at a very labile, hypoglycemic, currently uncontrolled(after that was resumed) Will increase Levemir from 18 units to 26 units today, continue with moderate sliding scale .  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 enoxaparin (LOVENOX) injection  70 mg Subcutaneous Q12H  . insulin aspart  0-15 Units Subcutaneous TID WC  . insulin detemir  18 Units Subcutaneous QHS  . pantoprazole  40 mg Oral Daily  . warfarin  7.5 mg Oral ONCE-1800  . Warfarin - Pharmacist Dosing Inpatient   Does not apply q1800   Continuous Infusions:  PRN Meds:.acetaminophen, ondansetron (ZOFRAN) IV  DVT Prophylaxis  Lovenox/warfarin  Lab Results  Component Value Date   PLT PLATELET CLUMPS NOTED ON SMEAR, UNABLE TO ESTIMATE 08/08/2015    Antibiotics   Anti-infectives    Start     Dose/Rate Route Frequency Ordered Stop   08/02/15 2300  vancomycin (VANCOCIN) IVPB 1000 mg/200 mL premix  Status:  Discontinued     1,000 mg 200 mL/hr over 60 Minutes Intravenous Every 12 hours 08/02/15 1134 08/04/15 1153   07/30/15 2300  vancomycin (VANCOCIN) IVPB 750 mg/150 ml premix  Status:  Discontinued     750 mg 150 mL/hr over 60 Minutes Intravenous Every 12 hours 07/30/15 1009 08/02/15 1134   07/30/15 1700  piperacillin-tazobactam (ZOSYN) IVPB 3.375 g     3.375 g 12.5 mL/hr over 240 Minutes Intravenous Every 8 hours 07/30/15 1009 08/05/15 2029   07/30/15 0945  piperacillin-tazobactam (ZOSYN) IVPB 3.375 g     3.375 g 100 mL/hr over 30 Minutes Intravenous  Once 07/30/15 0940 07/30/15 1130   07/30/15 0945  vancomycin (VANCOCIN) IVPB 1000 mg/200 mL premix  Status:  Discontinued     1,000 mg 200 mL/hr over 60 Minutes Intravenous  Once 07/30/15 0940 07/30/15 0943   07/30/15 0945  vancomycin (VANCOCIN) 1,500 mg in sodium chloride 0.9 % 500 mL IVPB     1,500 mg 250 mL/hr over 120 Minutes  Intravenous  Once 07/30/15 0943 07/30/15 1313          Objective:   Filed Vitals:   08/07/15 0842 08/07/15 1803 08/07/15 2120 08/08/15 0516  BP: 124/56 127/51 119/42 109/51  Pulse: 73 86 84 69  Temp: 98.4 F (36.9 C) 99 F (37.2 C) 100 F (37.8 C) 98.5 F (36.9 C)  TempSrc: Oral Oral Oral Oral  Resp: 18 18 18 17   Height:      Weight:   68.7 kg (151 lb 7.3 oz)   SpO2: 100% 100% 98% 100%    Wt Readings from Last 3 Encounters:  08/07/15 68.7 kg (151 lb 7.3 oz)  07/08/15 73.936 kg (163 lb)  06/16/15 74.118 kg (163 lb 6.4 oz)     Intake/Output Summary (Last 24 hours) at 08/08/15 1457 Last data filed at 08/08/15 0600  Gross per 24 hour  Intake    360 ml  Output   1125 ml  Net   -765 ml     Physical Exam  Awake Alert, Oriented ,  pressure ulcer in cheeks. [place].AT,PERRAL Supple Neck,No JVD,  Symmetrical Chest wall movement, Good air movement bilaterally,  No Gallops, S1, S2  + +ve B.Sounds, Abd Soft, No tenderness, No organomegaly appriciated, No rebound - guarding or rigidity. No Cyanosis, clubbing or edema   Data Review   Micro Results Recent Results (from the past 240 hour(s))  Culture, blood (routine x 2)     Status: None   Collection Time: 07/30/15 10:00 AM  Result Value Ref Range Status   Specimen Description BLOOD RIGHT HAND  Final   Special Requests BOTTLES DRAWN AEROBIC ONLY 2CC  Final   Culture NO GROWTH 5 DAYS  Final   Report Status 08/04/2015 FINAL  Final  Culture, blood (routine x 2)     Status: None   Collection Time: 07/30/15 10:10 AM  Result Value Ref Range Status   Specimen Description BLOOD LEFT WRIST  Final   Special Requests BOTTLES DRAWN AEROBIC ONLY 3CC  Final   Culture NO GROWTH 5 DAYS  Final   Report Status 08/04/2015 FINAL  Final  Urine culture     Status: None   Collection Time: 07/30/15 10:20 AM  Result Value Ref Range Status   Specimen Description URINE, CATHETERIZED  Final   Special Requests NONE  Final   Culture NO GROWTH  1 DAY  Final   Report Status 07/31/2015 FINAL  Final  MRSA PCR Screening     Status: None   Collection Time: 07/30/15  1:58 PM  Result Value Ref Range Status   MRSA by PCR NEGATIVE NEGATIVE Final    Comment:        The GeneXpert MRSA Assay (FDA approved for NASAL specimens only), is one component of a comprehensive MRSA colonization surveillance program. It is not  intended to diagnose MRSA infection nor to guide or monitor treatment for MRSA infections.   Culture, Urine     Status: None   Collection Time: 07/30/15  2:15 PM  Result Value Ref Range Status   Specimen Description URINE, CATHETERIZED  Final   Special Requests NONE  Final   Culture NO GROWTH 1 DAY  Final   Report Status 07/31/2015 FINAL  Final  C difficile quick scan w PCR reflex     Status: None   Collection Time: 08/04/15  2:57 PM  Result Value Ref Range Status   C Diff antigen NEGATIVE NEGATIVE Final   C Diff toxin NEGATIVE NEGATIVE Final   C Diff interpretation Negative for toxigenic C. difficile  Final    Radiology Reports Ct Head Wo Contrast  07/30/2015   CLINICAL DATA:  Found on floor, responsive to pain only.  EXAM: CT HEAD WITHOUT CONTRAST  CT CERVICAL SPINE WITHOUT CONTRAST  TECHNIQUE: Multidetector CT imaging of the head and cervical spine was performed following the standard protocol without intravenous contrast. Multiplanar CT image reconstructions of the cervical spine were also generated.  COMPARISON:  01/17/2015  FINDINGS: CT HEAD FINDINGS  There is extensive encephalomalacia in the right frontal lobe, right parietal lobe, and right occipital lobe. There is similar but less extensive encephalomalacia in the left parietal and occipital lobes. Chronic ischemic changes in the periventricular white matter are noted. There is no mass effect, midline shift, or acute hemorrhage. These findings are unchanged. Minimal layering mucus in the right maxillary sinus. Paranasal sinuses are otherwise clear. There is fluid  in the left mastoid air cells. The cranium is intact.  CT CERVICAL SPINE FINDINGS  Failure of fusion of the posterior C1 arch is a normal variation. There is no fracture or dislocation. There is no obvious soft tissue injury. There is no obvious spinal hematoma. There is a 2.0 x 1.7 cm soft tissue area in the right anterior triangle. Abnormal adenopathy is not excluded.  IMPRESSION: No acute intracranial pathology. Chronic ischemic changes are noted.  Nonspecific fluid in the left mastoid air cells.  No evidence of cervical spine injury.  Possible right cervical adenopathy as described. CT neck with contrast is recommended   Electronically Signed   By: Jolaine Click M.D.   On: 07/30/2015 13:13   Ct Cervical Spine Wo Contrast  07/30/2015   CLINICAL DATA:  Found on floor, responsive to pain only.  EXAM: CT HEAD WITHOUT CONTRAST  CT CERVICAL SPINE WITHOUT CONTRAST  TECHNIQUE: Multidetector CT imaging of the head and cervical spine was performed following the standard protocol without intravenous contrast. Multiplanar CT image reconstructions of the cervical spine were also generated.  COMPARISON:  01/17/2015  FINDINGS: CT HEAD FINDINGS  There is extensive encephalomalacia in the right frontal lobe, right parietal lobe, and right occipital lobe. There is similar but less extensive encephalomalacia in the left parietal and occipital lobes. Chronic ischemic changes in the periventricular white matter are noted. There is no mass effect, midline shift, or acute hemorrhage. These findings are unchanged. Minimal layering mucus in the right maxillary sinus. Paranasal sinuses are otherwise clear. There is fluid in the left mastoid air cells. The cranium is intact.  CT CERVICAL SPINE FINDINGS  Failure of fusion of the posterior C1 arch is a normal variation. There is no fracture or dislocation. There is no obvious soft tissue injury. There is no obvious spinal hematoma. There is a 2.0 x 1.7 cm soft tissue area in the right  anterior triangle. Abnormal adenopathy is not excluded.  IMPRESSION: No acute intracranial pathology. Chronic ischemic changes are noted.  Nonspecific fluid in the left mastoid air cells.  No evidence of cervical spine injury.  Possible right cervical adenopathy as described. CT neck with contrast is recommended   Electronically Signed   By: Jolaine Click M.D.   On: 07/30/2015 13:13   Dg Chest Port 1 View  08/04/2015   CLINICAL DATA:  Respiratory failure.  EXAM: PORTABLE CHEST - 1 VIEW  COMPARISON:  08/03/2015.  FINDINGS: Endotracheal tube tip at the right mainstem bronchus orifice. Proximal repositioning of approximately 3 cm suggested. NG tube in stable position. Mediastinum hilar structures are unremarkable. Questionable mild left upper lobe infiltrate remains. Mild left base subsegmental atelectasis. Prior cardiac valve replacement. Heart size normal. No pleural effusion or pneumothorax.  IMPRESSION: 1. Endotracheal tube tip at the right mainstem bronchus orifice. Proximal repositioning of approximately 3 cm suggested. NG tube in stable position. 2. Questionable mild left upper lobe infiltrate remains. Mild left base subsegmental atelectasis. Critical Value/emergent results were called by telephone at the time of interpretation on 08/04/2015 at 7:12 am to nurse Lexy, who verbally acknowledged these results.   Electronically Signed   By: Maisie Fus  Register   On: 08/04/2015 07:14   Dg Chest Port 1 View  08/03/2015   CLINICAL DATA:  Acute respiratory failure.  Shortness of breath.  EXAM: PORTABLE CHEST - 1 VIEW  COMPARISON:  08/02/2015.  FINDINGS: Endotracheal tube 1.7 cm above the carina. Proximal repositioning of approximately 1-2 cm should be considered. NG tube noted in stable position. Prior median sternotomy and cardiac valve replacement. Heart size normal. Mild infiltrate left upper lobe cannot be excluded on today's exam. Interim clearing of left base subsegmental atelectasis. No significant pleural  effusion. No pneumothorax.  IMPRESSION: 1. Endotracheal tube 1.7 cm above the carina. Proximal repositioning of approximately 1-2 cm should be considered. NG tube in stable position. 2. Prior median sternotomy and cardiac valve replacement. Heart size normal. 3. Mild infiltrate left upper lobe. Interim clearing of left base subsegmental atelectasis.   Electronically Signed   By: Maisie Fus  Register   On: 08/03/2015 07:12   Dg Chest Port 1 View  08/02/2015   CLINICAL DATA:  Advancement of the endotracheal tube  EXAM: PORTABLE CHEST - 1 VIEW  COMPARISON:  08/02/2015  FINDINGS: Endotracheal tube is near the carina, approximately 1 cm above the carina. There is left base atelectasis, stable. Right lung is clear. Heart is normal size. Prior median sternotomy and valve replacement. No visible effusions or acute bony abnormality.  IMPRESSION: Endotracheal tube near the carina, approximately 1 cm above the carina.  Left base atelectasis.   Electronically Signed   By: Charlett Nose M.D.   On: 08/02/2015 08:55   Dg Chest Port 1 View  08/02/2015   CLINICAL DATA:  Acute respiratory failure.  EXAM: PORTABLE CHEST - 1 VIEW  COMPARISON:  08/01/2015.  FINDINGS: Endotracheal tube noted with tip 1.3 cm above the carina. Proximal repositioning of 1-2 cm suggested. NG tube in stable position. Heart size normal. Aortic valve replacement. Left lower lobe subsegmental atelectasis and or mild infiltrate. No pleural effusion or pneumothorax.  IMPRESSION: 1. Endotracheal tube tip noted 1.3 cm above the carina. Proximal repositioning of 1 of 2 cm suggested. NG tube in stable position. 2. Mild left lower lobe subsegmental atelectasis and or infiltrate. 3. Prior cardiac valve replacement.  Heart size normal. These results will be called to the ordering  clinician or representative by the Radiologist Assistant, and communication documented in the PACS or zVision Dashboard.   Electronically Signed   By: Maisie Fus  Register   On: 08/02/2015 07:17    Dg Chest Port 1 View  08/01/2015   CLINICAL DATA:  Respiratory failure.  EXAM: PORTABLE CHEST - 1 VIEW  COMPARISON:  07/31/2015.  FINDINGS: The cardiac silhouette, mediastinal and hilar contours are within normal limits and stable. The endotracheal tube and NG tubes are stable. The lungs are clear. No pleural effusion or pneumothorax.  IMPRESSION: Stable support apparatus.  No acute pulmonary findings.   Electronically Signed   By: Rudie Meyer M.D.   On: 08/01/2015 12:43   Dg Chest Port 1 View  07/31/2015   CLINICAL DATA:  Acute respiratory failure  EXAM: PORTABLE CHEST - 1 VIEW  COMPARISON:  07/30/2015  FINDINGS: Endotracheal and nasogastric tubes are appropriately positioned. Evidence of median sternotomy/aortic valvuloplasty. Lungs are clear. Heart size normal.  IMPRESSION: No focal acute finding.   Electronically Signed   By: Christiana Pellant M.D.   On: 07/31/2015 09:21   Dg Chest Portable 1 View  07/30/2015   CLINICAL DATA:  Found unresponsive  EXAM: PORTABLE CHEST - 1 VIEW  COMPARISON:  07/01/2015  FINDINGS: Endotracheal and nasogastric tubes are appropriately positioned. Evidence of aortic valvuloplasty. Heart size normal. The lungs are clear.  IMPRESSION: No acute cardiopulmonary process.   Electronically Signed   By: Christiana Pellant M.D.   On: 07/30/2015 10:03     CBC  Recent Labs Lab 08/04/15 0350 08/05/15 0503 08/06/15 1035 08/07/15 0545 08/08/15 0506  WBC 9.3 7.6 6.2 6.5 6.7  HGB 7.2* 7.8* 9.2* 9.3* 10.0*  HCT 23.1* 24.1* 28.7* 29.2* 31.3*  PLT 167 210 289 339 PLATELET CLUMPS NOTED ON SMEAR, UNABLE TO ESTIMATE  MCV 79.7 81.1 82.5 82.0 80.7  MCH 24.8* 26.3 26.4 26.1 25.8*  MCHC 31.2 32.4 32.1 31.8 31.9  RDW 21.6* 21.5* 22.2* 22.1* 22.3*    Chemistries   Recent Labs Lab 08/02/15 0424 08/03/15 0424 08/04/15 0350 08/05/15 0503  NA 141 139 138 139  K 4.1 3.6 3.9 3.7  CL 112* 106 104 105  CO2 25 25 26 28   GLUCOSE 220* 137* 62* 59*  BUN 18 21* 21* 12  CREATININE  0.70 0.71 0.68 0.68  CALCIUM 8.2* 8.4* 8.6* 9.1  MG 2.0  --   --  1.9   ------------------------------------------------------------------------------------------------------------------ estimated creatinine clearance is 74.7 mL/min (by C-G formula based on Cr of 0.68). ------------------------------------------------------------------------------------------------------------------ No results for input(s): HGBA1C in the last 72 hours. ------------------------------------------------------------------------------------------------------------------ No results for input(s): CHOL, HDL, LDLCALC, TRIG, CHOLHDL, LDLDIRECT in the last 72 hours. ------------------------------------------------------------------------------------------------------------------ No results for input(s): TSH, T4TOTAL, T3FREE, THYROIDAB in the last 72 hours.  Invalid input(s): FREET3 ------------------------------------------------------------------------------------------------------------------ No results for input(s): VITAMINB12, FOLATE, FERRITIN, TIBC, IRON, RETICCTPCT in the last 72 hours.  Coagulation profile  Recent Labs Lab 08/02/15 0424 08/03/15 0835 08/07/15 0545 08/08/15 0625  INR 2.32* 1.64* 1.15 1.18    No results for input(s): DDIMER in the last 72 hours.  Cardiac Enzymes No results for input(s): CKMB, TROPONINI, MYOGLOBIN in the last 168 hours.  Invalid input(s): CK ------------------------------------------------------------------------------------------------------------------ Invalid input(s): POCBNP     Time Spent in minutes   30 minutes   ELGERGAWY, DAWOOD M.D on 08/08/2015 at 2:57 PM  Between 7am to 7pm - Pager - [phone]  After 7pm go to [email or web address] - password [facility]  [facility]   Office  [phone]

## 2015-08-08 NOTE — Telephone Encounter (Signed)
Dr.ELger Patriciaann Clan called wanted to let you know patient will be discharge on 08/09/2015. She is being placed on Lovenox due to her INR. They will have a home health nurse come out to monitor her. When her INR is back  to normal you can go ahead and take her off if needed. Any question provider can be reach at [phone].

## 2015-08-08 NOTE — Progress Notes (Signed)
ANTICOAGULATION CONSULT NOTE - Follow Up Consult  Pharmacy Consult for Coumadin and Lovenox Indication: prosthetic AVR and multiple CVAs  Allergies  Allergen Reactions  . Celebrex [Celecoxib] Rash  . Detrol [Tolterodine] Hives    Patient Measurements: Height: 5\' 3"  (160 cm) Weight: 151 lb 7.3 oz (68.7 kg) IBW/kg (Calculated) : 52.4 Heparin Dosing Weight:   Vital Signs: Temp: 98.5 F (36.9 C) (09/19 0516) Temp Source: Oral (09/19 0516) BP: 109/51 mmHg (09/19 0516) Pulse Rate: 69 (09/19 0516)  Labs:  Recent Labs  08/06/15 1035 08/07/15 0545 08/08/15 0506 08/08/15 0625  HGB 9.2* 9.3* 10.0*  --   HCT 28.7* 29.2* 31.3*  --   PLT 289 339 PLATELET CLUMPS NOTED ON SMEAR, UNABLE TO ESTIMATE  --   LABPROT  --  14.9  --  15.2  INR  --  1.15  --  1.18    Estimated Creatinine Clearance: 74.7 mL/min (by C-G formula based on Cr of 0.68).   Assessment: 54 yo female with hx of prosthetic AVR and multiple CVAs who is currently on Lovenox to bridge Coumadin. INR is subtherapeutic at 1.18, Hgb 10 and Plt clumped this am. Got FFP on 9/13. No bleeding noted.  PTA: 5 mg daily except 7.5 mg Wed  Goal of Therapy:  INR 2.5-3.5 Monitor platelets by anticoagulation protocol: Yes   Plan:  -Continue Lovenox 70 mg [place] q12h -Coumadin 7.5 mg po x1 -Daily INR, CBC q72h -Monitor s/sx bleeding   [facility], [facility].D., BCPS Clinical Pharmacist Pager: [phone] 08/08/2015 11:42 AM

## 2015-08-08 NOTE — Care Management Important Message (Signed)
Important Message  Patient Details  Name: MARRISSA DAI MRN: [id number] Date of Birth: 05-29-1961   Medicare Important Message Given:  Yes-third notification given    Orson Aloe 08/08/2015, 10:03 AM

## 2015-08-08 NOTE — Progress Notes (Signed)
Physical Therapy Treatment Patient Details Name: Madison [name] MRN: [id number] DOB: 1960/12/09 Today's Date: 08/08/2015    History of Present Illness Pt is a 54 y/o female with a PMH of recent ORIF for L trimalleolar fracture. She d/c'd to SNF and returned home with HHPT services on 9/9. She was found down by home health the next day and presented to the ED in severe septic shock. She was extubated on 08/04/15.    PT Comments    Madison [name] refuses post-acute rehab at SNF, despite it being my best recommendation;   She plans to dc to her sister's home, and she assured me multiple times that she will have 24 hour assist at home;   We worked on safety with transfers at a lateral scoot level to help keep weight off of LLE during transitions; Recommend sliding board, drop-arm 3in1 commode, tub transfer bench; she states she already has a wheelchair  Follow Up Recommendations  Home health PT;Supervision/Assistance - 24 hour (HHOT/Aide/SW)     Equipment Recommendations  Other (comment) (drop-arm 3in1, sliding board, tub transfer bench)    Recommendations for Other Services OT consult     Precautions / Restrictions Precautions Precautions: Fall Other Brace/Splint: Boot on LLE at all times Restrictions LLE Weight Bearing: Non weight bearing    Mobility  Bed Mobility Overal bed mobility: Needs Assistance Bed Mobility: Supine to Sit     Supine to sit: Supervision     General bed mobility comments: Use of rails and increased time required.   Transfers Overall transfer level: Needs assistance Equipment used: Rolling walker (2 wheeled) (drop-arm recliner) Transfers: Lateral/Scoot Transfers;Sit to/from Stand Sit to Stand: Mod assist        Lateral/Scoot Transfers: Min assist General transfer comment: Cues for technique; laterally scooted bed to recliner, then back to bed (uphill), then ultimately back to recliner with mod assist mostly for making sure she keeps NWB LLE; one rep  of sit to stand with RW; cues for technqiue and hand placement, and assist to ensure NWB LLE (which was quite difficult)  Ambulation/Gait                 Stairs            Wheelchair Mobility    Modified Rankin (Stroke Patients Only)       Balance     Sitting balance-Leahy Scale: Good Sitting balance - Comments: Able to simulate lateral lean with hip hike (as if placing sliding board)     Standing balance-Leahy Scale: Poor                      Cognition Arousal/Alertness: Awake/alert Behavior During Therapy: WFL for tasks assessed/performed (though tangential in conversation) Overall Cognitive Status: Within Functional Limits for tasks assessed                      Exercises      General Comments        Pertinent Vitals/Pain Pain Assessment: Faces Faces Pain Scale: Hurts a little bit Pain Location: L lower leg with moving Pain Descriptors / Indicators: Aching;Grimacing;Guarding Pain Intervention(s): Limited activity within patient's tolerance    Home Living                      Prior Function            PT Goals (current goals can now be found in the care plan section) Acute Rehab  PT Goals Patient Stated Goal: Return home PT Goal Formulation: With patient Time For Goal Achievement: 08/12/15 Potential to Achieve Goals: Fair Progress towards PT goals: Progressing toward goals    Frequency  Min 3X/week    PT Plan Discharge plan needs to be updated (pt is refusing SNF)    Co-evaluation             End of Session Equipment Utilized During Treatment: Gait belt Activity Tolerance: Patient tolerated treatment well Patient left: in chair;with call bell/phone within reach;with chair alarm set     Time: 6629-4765 PT Time Calculation (min) (ACUTE ONLY): 25 min  Charges:  $Therapeutic Activity: 23-37 mins                    G Codes:      Madison [name] 08/08/2015, 12:21 PM  Madison [name], Madison [name]  Acute  Rehabilitation Services Pager [phone] Office [phone]

## 2015-08-09 ENCOUNTER — Encounter: Payer: Self-pay | Admitting: *Deleted

## 2015-08-09 DIAGNOSIS — I2510 Atherosclerotic heart disease of native coronary artery without angina pectoris: Secondary | ICD-10-CM

## 2015-08-09 LAB — CBC
HEMATOCRIT: 29.4 % — AB (ref 36.0–46.0)
HEMOGLOBIN: 9.3 g/dL — AB (ref 12.0–15.0)
MCH: 25.9 pg — AB (ref 26.0–34.0)
MCHC: 31.6 g/dL (ref 30.0–36.0)
MCV: 81.9 fL (ref 78.0–100.0)
Platelets: 411 10*3/uL — ABNORMAL HIGH (ref 150–400)
RBC: 3.59 MIL/uL — AB (ref 3.87–5.11)
RDW: 22.2 % — ABNORMAL HIGH (ref 11.5–15.5)
WBC: 7.1 10*3/uL (ref 4.0–10.5)

## 2015-08-09 LAB — GLUCOSE, CAPILLARY
GLUCOSE-CAPILLARY: 113 mg/dL — AB (ref 65–99)
Glucose-Capillary: 190 mg/dL — ABNORMAL HIGH (ref 65–99)

## 2015-08-09 LAB — PROTIME-INR
INR: 1.39 (ref 0.00–1.49)
PROTHROMBIN TIME: 17.2 s — AB (ref 11.6–15.2)

## 2015-08-09 MED ORDER — ENOXAPARIN SODIUM 80 MG/0.8ML ~~LOC~~ SOLN: 70.0000 mg | Freq: Two times a day (BID) | SUBCUTANEOUS | Status: DC

## 2015-08-09 MED ORDER — INSULIN DETEMIR 100 UNIT/ML ~~LOC~~ SOLN: 26.0000 [IU] | Freq: Every day | SUBCUTANEOUS | Status: DC

## 2015-08-09 MED ORDER — ZINC 30 MG PO CAPS: 1.0000 | ORAL_CAPSULE | Freq: Every day | ORAL | Status: DC

## 2015-08-09 MED ORDER — VITAMIN C 500 MG PO TABS: 500.0000 mg | ORAL_TABLET | Freq: Every day | ORAL | Status: DC

## 2015-08-09 MED ORDER — MUPIROCIN 2 % EX OINT: TOPICAL_OINTMENT | Freq: Two times a day (BID) | CUTANEOUS | Status: DC

## 2015-08-09 MED ORDER — ACETAMINOPHEN 325 MG PO TABS: 650.0000 mg | ORAL_TABLET | Freq: Four times a day (QID) | ORAL | Status: DC | PRN

## 2015-08-09 MED ORDER — WARFARIN SODIUM 5 MG PO TABS: 5.0000 mg | ORAL_TABLET | Freq: Every day | ORAL | Status: DC

## 2015-08-09 NOTE — Progress Notes (Signed)
Physical Therapy Treatment Patient Details Name: WALLIS SPIZZIRRI MRN: [id number] DOB: [birth year] Today's Date: 08/09/2015    History of Present Illness Pt is a 54 y/o female with a PMH of recent ORIF for L trimalleolar fracture. She d/c'd to SNF and returned home with HHPT services on 9/9. She was found down by home health the next day and presented to the ED in severe septic shock. She was extubated on 08/04/15.    PT Comments    Worked with sliding board for transfer today, and Ms. Rentz seemed quite pleased with using it  Follow Up Recommendations  Home health PT;Supervision/Assistance - 24 hour (HHOT/Aide/SW)     Equipment Recommendations  Other (comment) (drop-arm 3in1, sliding board, tub transfer bench)    Recommendations for Other Services OT consult     Precautions / Restrictions Precautions Precautions: Fall Other Brace/Splint: Boot on LLE at all times Restrictions LLE Weight Bearing: Non weight bearing    Mobility  Bed Mobility Overal bed mobility: Needs Assistance Bed Mobility: Supine to Sit     Supine to sit: Supervision     General bed mobility comments: Use of rails and increased time required.   Transfers Overall transfer level: Needs assistance Equipment used:  (sliding board and drop-arm recliner) Transfers: Lateral/Scoot Transfers          Lateral/Scoot Transfers: Min assist General transfer comment: Cues for technqiue, min assist to place and take away sliding board and for palcemetn; safety cues fo rhand placement with sliding board; good keeping NWB LLE with sliding board; practiced "going uphill' back to bed as well; We discussed the need to be able to tell her sister how to help her with the board  Ambulation/Gait                 Stairs            Wheelchair Mobility    Modified Rankin (Stroke Patients Only)       Balance     Sitting balance-Leahy Scale: Good                              Cognition  Arousal/Alertness: Awake/alert Behavior During Therapy: WFL for tasks assessed/performed Overall Cognitive Status: Within Functional Limits for tasks assessed (for simple mobility tasks)                      Exercises      General Comments        Pertinent Vitals/Pain Pain Assessment: No/denies pain    Home Living                      Prior Function            PT Goals (current goals can now be found in the care plan section) Acute Rehab PT Goals Patient Stated Goal: Go home; plans to go home with her sister PT Goal Formulation: With patient Time For Goal Achievement: 08/12/15 Potential to Achieve Goals: Fair Progress towards PT goals: Progressing toward goals    Frequency  Min 3X/week    PT Plan Current plan remains appropriate (refusing snf)    Co-evaluation             End of Session Equipment Utilized During Treatment:  (sliding board) Activity Tolerance: Patient tolerated treatment well Patient left: in chair;with call bell/phone within reach;with chair alarm set     Time: 4097-3532 PT  Time Calculation (min) (ACUTE ONLY): 22 min  Charges:  $Therapeutic Activity: 8-22 mins                    G Codes:      [name] 08/09/2015, 11:36 AM  [name], PT  Acute Rehabilitation Services Pager [phone] Office [phone]

## 2015-08-09 NOTE — Progress Notes (Signed)
Inpatient Diabetes Program Recommendations  AACE/ADA: New Consensus Statement on Inpatient Glycemic Control (2015)  Target Ranges:  Prepandial:   less than 140 mg/dL      Peak postprandial:   less than 180 mg/dL (1-2 hours)      Critically ill patients:  140 - 180 mg/dL   Review of Glycemic Control Inpatient Diabetes Program Recommendations:   Insulin - Meal Coverage: Please consider addition of novolog meal coverage of 4 units tidwc. Fasting cbg's improving, but post-prandial (ac lunch and ac supper and HS) are elevated unless given high doses of correction prior to the meal.   Thank you Lenor Coffin, RN, MSN, CDE  Diabetes Inpatient Program Office: [phone] Pager: [phone] 8:00 am to 5:00 pm

## 2015-08-09 NOTE — Discharge Summary (Signed)
MATEA RIDL, is a 54 y.o. female  DOB 05/31/1961  MRN [id number].  Admission date:  07/30/2015  Admitting Physician  Chilton Greathouse, MD  Discharge Date:  08/09/2015   Primary MD  [name], NP  Recommendations for primary care physician for things to follow:  - Patient to continue to have her INR checked regularly as she is on Lovenox as INR is subtherapeutic, INR will be checked by home care, results will be sent to PCP regarding management of anticoagulation. - Patient wanted to follow with orthopedic as an outpatient - Follow with plastic surgery as an outpatient    Admission Diagnosis  Diabetic ketoacidosis with coma associated with other specified diabetes mellitus [E13.11]   Discharge Diagnosis  Diabetic ketoacidosis with coma associated with other specified diabetes mellitus [E13.11]    Active Problems:   CAD (coronary artery disease)   Chronic anticoagulation   Acute respiratory failure   DKA (diabetic ketoacidoses)   Diabetic ketoacidosis with coma associated with other specified diabetes mellitus   Pressure ulcer   Wound, open, face      Past Medical History  Diagnosis Date  . CAD (coronary artery disease)   . Obesity   . Hypercholesteremia   . HTN (hypertension)   . Dyslipidemia   . Aortic stenosis   . Thrombophlebitis   . Heart murmur   . Myocardial infarction 12/2014  . Pneumonia 11/2014; 12/2014  . GERD (gastroesophageal reflux disease)   . 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 Anxiety   . Depression   . Allergy   . CHF (congestive heart failure)   . Rheumatoid arthritis(714.0)     "hands" (07/01/2015)  . Diabetes dx'd 1982    "went straight to insulin"    Past Surgical History  Procedure Laterality Date  . Cesarean section  1995  . Foot fracture surgery    . Knee arthroscopy Right   .  Neuroplasty / transposition median nerve at carpal tunnel    . Left heart catheterization with coronary angiogram N/A 12/08/2014    Procedure: LEFT HEART CATHETERIZATION WITH CORONARY ANGIOGRAM;  Surgeon: Iran Ouch, MD;  Location: [facility] CATH LAB;  Service: Cardiovascular;  Laterality: N/A;  . Percutaneous coronary stent intervention (pci-s) N/A 12/09/2014    Procedure: PERCUTANEOUS CORONARY STENT INTERVENTION (PCI-S);  Surgeon: [name], MD;  Location: [facility] CATH LAB;  Service: Cardiovascular;  Laterality: N/A;  . Cardiac catheterization  12/08/2014  . Coronary angioplasty with stent placement  12/09/2014  . Aortic valve replacement (avr)/coronary artery bypass grafting (cabg)  "2014"    [name] 03/08/2014  . Fracture surgery    . Tubal ligation  1995  . Cataract extraction Left   . Coronary artery bypass graft    . Orif ankle fracture Left 07/04/2015    Procedure: OPEN REDUCTION INTERNAL FIXATION (ORIF)  TRIMAL ANKLE FRACTURE;  Surgeon: [name], MD;  Location: [facility] OR;  Service: Orthopedics;  Laterality: Left;       History of present illness and  Hospital Course:     Kindly see H&P for history of present illness and admission details, please review complete Labs, Consult reports and Test reports for all details in brief  HPI  from the history and physical done on the day of admission 54 y/o F with PMH of obesity, CAD s/p CABG, bicuspid AV with prosthetic AVR (2012) on chronic coumadin, multiple strokes, RA, HLD, HTN, AS, MI (12/2014), CHF, GERD, anxiety, depression, anxiety, DM and recent admit from 8/12-8/20 after a mechanical fall with left ankle fracture who presented to [facility] ER 9/10 after being found altered at home by [facility] RN. She was recently discharged from [name]   Staff reports that patient was found at home lying beside bed altered by [facility] RN. She was noted to be incontinent of bowel/bladder and covered in emesis. On arrival, the patient was noted to be  hypotensive and CBG read high. She was unresponsive on arrival to ER and was intubated by the EDP. Initial labs: NA 127, K >7.5, CO2 <5, sr cr 2.31, glucose 1284, albumin 3.2, troponin 0.0, WBC 43, Hgb 9.6, and platelets 425. Initial CXR post intubation showed no acute process and lines/tubes in good position. The patient was treated for hyperkalemia with insulin and bicarbonate. Repeat K 6.6, sr cr 1.20. Lactic acid 8.65. PCCM called for ICU admission.    Hospital Course  54 y/o F admitted on 9/10 after being found altered at home, incontinent of bowel/bladder, emesis and altered. Recent admission for LLE fracture with d/c to SNF. Found at home by [facility]. Required intubation for airway protection, extubated 9/16.  Acute respiratory failure - Secondary to altered mental status and pneumonia, required intubation initially, successfully extubated. - Resolved, currently on room air.  Pneumonia - Chest x-ray showing evidence of left upper lobe infiltrate on admission - Treated with IV vancomycin and Zosyn, currently off antibiotics - Blood cultures no growth to date  Diabetes mellitus with DKA on admission - CBGs at a very labile, will be discharged on Levemir 26 units subcutaneous daily at bedtime. And insulin sliding scale  History of coronary artery disease status post CABG - Denies any chest pain or shortness of breath today - on anticoagulation  Acute renal failure - Resolved  Chronic warfarin therapy for mechanical AV valve - On Lovenox until INR is therapeutic, continue on warfarin 5 mg oral daily, and PCP will follow her INR to therapeutic and then he can stop Lovenox. Scars with PCP yesterday, INR is 1.39 on discharge.  cheek ulcers in cheek area - plastic surgery consult appreciated, continue with Bactroban, will follow as an outpatient  Left ankle fracture - will need to follow with orthopedic as an outpatient  Discharge Condition:  Stable   Follow  UP  Follow-up Information    Call [name], NP.   Specialty:  Family Medicine   Why:  Posthospitalization follow-up, INR check and warfarin adjustment.   Contact information:   [address] [place] [address] [phone]       Follow up with [facility].   Specialty:  Home Health Services   Why:  Please call [facility] [phone] if you should have any further questions regarding [facility]   Contact information:   [address] [place] [address] [phone]       Follow up with [facility], DO. Call in 1 week.   Specialty:  Plastic Surgery   Why:  Posthospitalization follow-up   Contact information:   [address] [place] [address] [phone]       Follow up with Cheral Almas, MD. Call in 1 week.   Specialty:  Orthopedic Surgery   Why:  Left ankle fracture   Contact information:   [address] [place] [address] [phone]         Discharge Instructions  and  Discharge Medications     Discharge Instructions    AMB Referral to [facility] Care Management    Complete by:  As directed   Please assign to [facility] RNCM. Patient currently in hospital however. Please assign for disease and symptom management for DM. Has been active in the past with [facility]. Consents signed. She plans on going to stay with her sister, [name] for about 2 weeks. Tracy's address [address], [place], [place] [address]. Confirmed patient's cell as [phone] and sister Tracy's number is [phone]. Consents signed. Please call with questions. Raiford Noble, MSN-Ed, [facility] Liaison-[phone]  Reason for consult:  Please assign to [facility] RNCM  Diagnoses of:  Diabetes  Expected date of contact:  1-3 days (reserved for hospital discharges)     Diet - low sodium heart healthy    Complete by:  As directed      Discharge instructions    Complete by:  As directed   Follow with Primary MD  [name], NP in 3-4 days - Patient to have INR checked daily results faxed to Dr. Buddy Duty office, will be instructed bilateral by his office when to stop Lovenox and to continue her warfarin only.  Get CBC, CMP,INR,  2 view Chest X ray checked  by Primary MD next visit.    Activity: As recommended by PT/OT   Disposition Home with home care   Diet: Heart Healthy  , with feeding assistance and aspiration precautions.  For Heart failure patients - Check your Weight same time everyday, if you gain over 2 pounds, or you develop in leg swelling, experience more shortness of breath or chest pain, call your Primary MD immediately. Follow Cardiac Low Salt Diet and 1.5 lit/day fluid restriction.   On your next visit with your primary care physician please Get Medicines reviewed and adjusted.   Please request your Prim.MD to go over all Hospital Tests and Procedure/Radiological results at the follow up, please get all Hospital records sent to your Prim MD by signing hospital release before you go home.   If you experience worsening of your admission symptoms, develop shortness of breath, life threatening emergency, suicidal or homicidal thoughts you must seek medical attention immediately by calling 911 or calling your MD immediately  if symptoms less severe.  You Must read complete instructions/literature along with all the possible adverse reactions/side effects for all the Medicines you take and that have been prescribed to you. Take any new Medicines after you have completely understood and accpet all the possible adverse reactions/side effects.   Do not drive, operating heavy machinery, perform activities at heights, swimming or participation in water activities or provide baby sitting services if your were admitted for syncope or siezures until you have seen by Primary MD or a Neurologist and advised to do so again.  Do not drive when taking Pain medications.    Do not take  more than prescribed Pain, Sleep and Anxiety Medications  Special Instructions: If you have smoked or chewed Tobacco  in the  last 2 yrs please stop smoking, stop any regular Alcohol  and or any Recreational drug use.  Wear Seat belts while driving.   Please note  You were cared for by a hospitalist during your hospital stay. If you have any questions about your discharge medications or the care you received while you were in the hospital after you are discharged, you can call the unit and asked to speak with the hospitalist on call if the hospitalist that took care of you is not available. Once you are discharged, your primary care physician will handle any further medical issues. Please note that NO REFILLS for any discharge medications will be authorized once you are discharged, as it is imperative that you return to your primary care physician (or establish a relationship with a primary care physician if you do not have one) for your aftercare needs so that they can reassess your need for medications and monitor your lab values.     Increase activity slowly    Complete by:  As directed             Medication List    STOP taking these medications        clonazePAM 0.5 MG tablet  Commonly known as:  KLONOPIN     clopidogrel 75 MG tablet  Commonly known as:  PLAVIX     glucagon 1 MG injection  Commonly known as:  GLUCAGON EMERGENCY     HUMULIN N KWIKPEN 100 UNIT/ML Kiwkpen  Generic drug:  Insulin NPH (Human) (Isophane)     HYDROcodone-acetaminophen 7.5-325 MG per tablet  Commonly known as:  NORCO     iron polysaccharides 150 MG capsule  Commonly known as:  NIFEREX     traMADol 50 MG tablet  Commonly known as:  ULTRAM     zolpidem 10 MG tablet  Commonly known as:  AMBIEN      TAKE these medications        acetaminophen 325 MG tablet  Commonly known as:  TYLENOL  Take 2 tablets (650 mg total) by mouth every 6 (six) hours as needed for mild pain (temp > 101.5).      aspirin 81 MG tablet  Take 1 tablet (81 mg total) by mouth daily.     baclofen 10 MG tablet  Commonly known as:  LIORESAL  TAKE 1 TABLET (10 MG TOTAL) BY MOUTH 2 (TWO) TIMES DAILY.     enoxaparin 80 MG/0.8ML injection  Commonly known as:  LOVENOX  Inject 0.7 mLs (70 mg total) into the skin every 12 (twelve) hours.     escitalopram 20 MG tablet  Commonly known as:  LEXAPRO  Take 20 mg by mouth daily.     insulin aspart 100 UNIT/ML injection  Commonly known as:  novoLOG  Inject 0-15 Units into the skin 3 (three) times daily with meals.     insulin detemir 100 UNIT/ML injection  Commonly known as:  LEVEMIR  Inject 0.26 mLs (26 Units total) into the skin at bedtime.     lisinopril 2.5 MG tablet  Commonly known as:  PRINIVIL,ZESTRIL  TAKE 1 TABLET BY MOUTH EVERY DAY     metoprolol tartrate 25 MG tablet  Commonly known as:  LOPRESSOR  Take 12.5 mg by mouth 2 (two) times daily.     mupirocin ointment 2 %  Commonly known as:  BACTROBAN  Place into the nose 2 (two) times daily. Please apply to cheecks     omeprazole 40 MG capsule  Commonly known as:  PRILOSEC  TAKE 1 CAPSULE (40 MG TOTAL) BY MOUTH DAILY.     pravastatin 20 MG tablet  Commonly known as:  PRAVACHOL  TAKE 1 TABLET BY MOUTH EVERY DAY     RHEUMATREX 2.5 MG tablet  Generic drug:  methotrexate  TAKE 1 TABLET 2 TIMES A WEEK     solifenacin 5 MG tablet  Commonly known as:  VESICARE  Take 1 tablet (5 mg total) by mouth daily.     traZODone 50 MG tablet  Commonly known as:  DESYREL  Take 0.5-1 tablets (25-50 mg total) by mouth at bedtime as needed for sleep.     vitamin C 500 MG tablet  Commonly known as:  ASCORBIC ACID  Take 1 tablet (500 mg total) by mouth daily.     warfarin 5 MG tablet  Commonly known as:  COUMADIN  Take 1 tablet (5 mg total) by mouth daily.     Zinc 30 MG Caps  Take 1 capsule (30 mg total) by mouth daily.          Diet and Activity recommendation: See Discharge Instructions  above   Consults obtained -  PCCM>>TRH 9/17 Plastic surgery 9/19  Major procedures and Radiology Reports - PLEASE review detailed and final reports for all details, in brief -   Intubated 9/10, extubated 9/16  Ct Head Wo Contrast  07/30/2015   CLINICAL DATA:  Found on floor, responsive to pain only.  EXAM: CT HEAD WITHOUT CONTRAST  CT CERVICAL SPINE WITHOUT CONTRAST  TECHNIQUE: Multidetector CT imaging of the head and cervical spine was performed following the standard protocol without intravenous contrast. Multiplanar CT image reconstructions of the cervical spine were also generated.  COMPARISON:  01/17/2015  FINDINGS: CT HEAD FINDINGS  There is extensive encephalomalacia in the right frontal lobe, right parietal lobe, and right occipital lobe. There is similar but less extensive encephalomalacia in the left parietal and occipital lobes. Chronic ischemic changes in the periventricular white matter are noted. There is no mass effect, midline shift, or acute hemorrhage. These findings are unchanged. Minimal layering mucus in the right maxillary sinus. Paranasal sinuses are otherwise clear. There is fluid in the left mastoid air cells. The cranium is intact.  CT CERVICAL SPINE FINDINGS  Failure of fusion of the posterior C1 arch is a normal variation. There is no fracture or dislocation. There is no obvious soft tissue injury. There is no obvious spinal hematoma. There is a 2.0 x 1.7 cm soft tissue area in the right anterior triangle. Abnormal adenopathy is not excluded.  IMPRESSION: No acute intracranial pathology. Chronic ischemic changes are noted.  Nonspecific fluid in the left mastoid air cells.  No evidence of cervical spine injury.  Possible right cervical adenopathy as described. CT neck with contrast is recommended   Electronically Signed   By: Jolaine Click M.D.   On: 07/30/2015 13:13   Ct Cervical Spine Wo Contrast  07/30/2015   CLINICAL DATA:  Found on floor, responsive to pain only.  EXAM:  CT HEAD WITHOUT CONTRAST  CT CERVICAL SPINE WITHOUT CONTRAST  TECHNIQUE: Multidetector CT imaging of the head and cervical spine was performed following the standard protocol without intravenous contrast. Multiplanar CT image reconstructions of the cervical spine were also generated.  COMPARISON:  01/17/2015  FINDINGS: CT HEAD FINDINGS  There is extensive encephalomalacia in the right frontal lobe, right parietal lobe, and right occipital lobe. There is similar but less extensive encephalomalacia in the left parietal  and occipital lobes. Chronic ischemic changes in the periventricular white matter are noted. There is no mass effect, midline shift, or acute hemorrhage. These findings are unchanged. Minimal layering mucus in the right maxillary sinus. Paranasal sinuses are otherwise clear. There is fluid in the left mastoid air cells. The cranium is intact.  CT CERVICAL SPINE FINDINGS  Failure of fusion of the posterior C1 arch is a normal variation. There is no fracture or dislocation. There is no obvious soft tissue injury. There is no obvious spinal hematoma. There is a 2.0 x 1.7 cm soft tissue area in the right anterior triangle. Abnormal adenopathy is not excluded.  IMPRESSION: No acute intracranial pathology. Chronic ischemic changes are noted.  Nonspecific fluid in the left mastoid air cells.  No evidence of cervical spine injury.  Possible right cervical adenopathy as described. CT neck with contrast is recommended   Electronically Signed   By: Jolaine Click M.D.   On: 07/30/2015 13:13   Dg Chest Port 1 View  08/04/2015   CLINICAL DATA:  Respiratory failure.  EXAM: PORTABLE CHEST - 1 VIEW  COMPARISON:  08/03/2015.  FINDINGS: Endotracheal tube tip at the right mainstem bronchus orifice. Proximal repositioning of approximately 3 cm suggested. NG tube in stable position. Mediastinum hilar structures are unremarkable. Questionable mild left upper lobe infiltrate remains. Mild left base subsegmental atelectasis.  Prior cardiac valve replacement. Heart size normal. No pleural effusion or pneumothorax.  IMPRESSION: 1. Endotracheal tube tip at the right mainstem bronchus orifice. Proximal repositioning of approximately 3 cm suggested. NG tube in stable position. 2. Questionable mild left upper lobe infiltrate remains. Mild left base subsegmental atelectasis. Critical Value/emergent results were called by telephone at the time of interpretation on 08/04/2015 at 7:12 am to nurse Lexy, who verbally acknowledged these results.   Electronically Signed   By: Maisie Fus  Register   On: 08/04/2015 07:14   Dg Chest Port 1 View  08/03/2015   CLINICAL DATA:  Acute respiratory failure.  Shortness of breath.  EXAM: PORTABLE CHEST - 1 VIEW  COMPARISON:  08/02/2015.  FINDINGS: Endotracheal tube 1.7 cm above the carina. Proximal repositioning of approximately 1-2 cm should be considered. NG tube noted in stable position. Prior median sternotomy and cardiac valve replacement. Heart size normal. Mild infiltrate left upper lobe cannot be excluded on today's exam. Interim clearing of left base subsegmental atelectasis. No significant pleural effusion. No pneumothorax.  IMPRESSION: 1. Endotracheal tube 1.7 cm above the carina. Proximal repositioning of approximately 1-2 cm should be considered. NG tube in stable position. 2. Prior median sternotomy and cardiac valve replacement. Heart size normal. 3. Mild infiltrate left upper lobe. Interim clearing of left base subsegmental atelectasis.   Electronically Signed   By: Maisie Fus  Register   On: 08/03/2015 07:12   Dg Chest Port 1 View  08/02/2015   CLINICAL DATA:  Advancement of the endotracheal tube  EXAM: PORTABLE CHEST - 1 VIEW  COMPARISON:  08/02/2015  FINDINGS: Endotracheal tube is near the carina, approximately 1 cm above the carina. There is left base atelectasis, stable. Right lung is clear. Heart is normal size. Prior median sternotomy and valve replacement. No visible effusions or acute bony  abnormality.  IMPRESSION: Endotracheal tube near the carina, approximately 1 cm above the carina.  Left base atelectasis.   Electronically Signed   By: Charlett Nose M.D.   On: 08/02/2015 08:55   Dg Chest Port 1 View  08/02/2015   CLINICAL DATA:  Acute respiratory failure.  EXAM:  PORTABLE CHEST - 1 VIEW  COMPARISON:  08/01/2015.  FINDINGS: Endotracheal tube noted with tip 1.3 cm above the carina. Proximal repositioning of 1-2 cm suggested. NG tube in stable position. Heart size normal. Aortic valve replacement. Left lower lobe subsegmental atelectasis and or mild infiltrate. No pleural effusion or pneumothorax.  IMPRESSION: 1. Endotracheal tube tip noted 1.3 cm above the carina. Proximal repositioning of 1 of 2 cm suggested. NG tube in stable position. 2. Mild left lower lobe subsegmental atelectasis and or infiltrate. 3. Prior cardiac valve replacement.  Heart size normal. These results will be called to the ordering clinician or representative by the Radiologist Assistant, and communication documented in the PACS or zVision Dashboard.   Electronically Signed   By: Maisie Fus  Register   On: 08/02/2015 07:17   Dg Chest Port 1 View  08/01/2015   CLINICAL DATA:  Respiratory failure.  EXAM: PORTABLE CHEST - 1 VIEW  COMPARISON:  07/31/2015.  FINDINGS: The cardiac silhouette, mediastinal and hilar contours are within normal limits and stable. The endotracheal tube and NG tubes are stable. The lungs are clear. No pleural effusion or pneumothorax.  IMPRESSION: Stable support apparatus.  No acute pulmonary findings.   Electronically Signed   By: Rudie Meyer M.D.   On: 08/01/2015 12:43   Dg Chest Port 1 View  07/31/2015   CLINICAL DATA:  Acute respiratory failure  EXAM: PORTABLE CHEST - 1 VIEW  COMPARISON:  07/30/2015  FINDINGS: Endotracheal and nasogastric tubes are appropriately positioned. Evidence of median sternotomy/aortic valvuloplasty. Lungs are clear. Heart size normal.  IMPRESSION: No focal acute finding.    Electronically Signed   By: Christiana Pellant M.D.   On: 07/31/2015 09:21   Dg Chest Portable 1 View  07/30/2015   CLINICAL DATA:  Found unresponsive  EXAM: PORTABLE CHEST - 1 VIEW  COMPARISON:  07/01/2015  FINDINGS: Endotracheal and nasogastric tubes are appropriately positioned. Evidence of aortic valvuloplasty. Heart size normal. The lungs are clear.  IMPRESSION: No acute cardiopulmonary process.   Electronically Signed   By: Christiana Pellant M.D.   On: 07/30/2015 10:03    Micro Results     Recent Results (from the past 240 hour(s))  MRSA PCR Screening     Status: None   Collection Time: 07/30/15  1:58 PM  Result Value Ref Range Status   MRSA by PCR NEGATIVE NEGATIVE Final    Comment:        The GeneXpert MRSA Assay (FDA approved for NASAL specimens only), is one component of a comprehensive MRSA colonization surveillance program. It is not intended to diagnose MRSA infection nor to guide or monitor treatment for MRSA infections.   Culture, Urine     Status: None   Collection Time: 07/30/15  2:15 PM  Result Value Ref Range Status   Specimen Description URINE, CATHETERIZED  Final   Special Requests NONE  Final   Culture NO GROWTH 1 DAY  Final   Report Status 07/31/2015 FINAL  Final  C difficile quick scan w PCR reflex     Status: None   Collection Time: 08/04/15  2:57 PM  Result Value Ref Range Status   C Diff antigen NEGATIVE NEGATIVE Final   C Diff toxin NEGATIVE NEGATIVE Final   C Diff interpretation Negative for toxigenic C. difficile  Final       Today   Subjective:   Nasim Garofano today has no headache,no chest abdominal pain,no new weakness tingling or numbness, feels much better wants to go  home today.   Objective:   Blood pressure 134/51, pulse 77, temperature 98.3 F (36.8 C), temperature source Oral, resp. rate 18, height 5\' 3"  (1.6 m), weight 68.04 kg (150 lb), SpO2 100 %.   Intake/Output Summary (Last 24 hours) at 08/09/15 1213 Last data filed at  08/09/15 1151  Gross per 24 hour  Intake    360 ml  Output    100 ml  Net    260 ml    Exam Awake Alert, Oriented , pressure ulcer in cheeks. [place].AT,PERRAL Supple Neck,No JVD,  Symmetrical Chest wall movement, Good air movement bilaterally,  No Gallops, S1, S2 + +ve B.Sounds, Abd Soft, No tenderness, No organomegaly appriciated, No rebound - guarding or rigidity. No Cyanosis, clubbing or edema, LLE walking boot.   Data Review   CBC w Diff: Lab Results  Component Value Date   WBC 7.1 08/09/2015   HGB 9.3* 08/09/2015   HCT 29.4* 08/09/2015   PLT 411* 08/09/2015   LYMPHOPCT 10* 07/30/2015   BANDSPCT 20* 09/30/2014   MONOPCT 3 07/30/2015   EOSPCT 0 07/30/2015   BASOPCT 0 07/30/2015    CMP: Lab Results  Component Value Date   NA 139 08/05/2015   K 3.7 08/05/2015   CL 105 08/05/2015   CO2 28 08/05/2015   BUN 12 08/05/2015   CREATININE 0.68 08/05/2015   PROT 5.6* 07/30/2015   ALBUMIN 2.7* 07/30/2015   BILITOT 1.4* 07/30/2015   ALKPHOS 116 07/30/2015   AST 37 07/30/2015   ALT 27 07/30/2015  .   Total Time in preparing paper work, data evaluation and todays exam - 35 minutes  ELGERGAWY, DAWOOD M.D on 08/09/2015 at 12:13 PM  [facility]   Office  [phone]

## 2015-08-09 NOTE — Discharge Instructions (Signed)
Follow with Primary MD [name], NP in 3-4 days - Patient to have INR checked daily results faxed to Dr. Buddy Duty office, will be instructed bilateral by his office when to stop Lovenox and to continue her warfarin only.  Get CBC, CMP,INR,  2 view Chest X ray checked  by Primary MD next visit.    Activity: As recommended by PT/OT   Disposition Home with home care   Diet: Heart Healthy  , with feeding assistance and aspiration precautions.  For Heart failure patients - Check your Weight same time everyday, if you gain over 2 pounds, or you develop in leg swelling, experience more shortness of breath or chest pain, call your Primary MD immediately. Follow Cardiac Low Salt Diet and 1.5 lit/day fluid restriction.   On your next visit with your primary care physician please Get Medicines reviewed and adjusted.   Please request your Prim.MD to go over all Hospital Tests and Procedure/Radiological results at the follow up, please get all Hospital records sent to your Prim MD by signing hospital release before you go home.   If you experience worsening of your admission symptoms, develop shortness of breath, life threatening emergency, suicidal or homicidal thoughts you must seek medical attention immediately by calling 911 or calling your MD immediately  if symptoms less severe.  You Must read complete instructions/literature along with all the possible adverse reactions/side effects for all the Medicines you take and that have been prescribed to you. Take any new Medicines after you have completely understood and accpet all the possible adverse reactions/side effects.   Do not drive, operating heavy machinery, perform activities at heights, swimming or participation in water activities or provide baby sitting services if your were admitted for syncope or siezures until you have seen by Primary MD or a Neurologist and advised to do so again.  Do not drive when taking Pain  medications.    Do not take more than prescribed Pain, Sleep and Anxiety Medications  Special Instructions: If you have smoked or chewed Tobacco  in the last 2 yrs please stop smoking, stop any regular Alcohol  and or any Recreational drug use.  Wear Seat belts while driving.   Please note  You were cared for by a hospitalist during your hospital stay. If you have any questions about your discharge medications or the care you received while you were in the hospital after you are discharged, you can call the unit and asked to speak with the hospitalist on call if the hospitalist that took care of you is not available. Once you are discharged, your primary care physician will handle any further medical issues. Please note that NO REFILLS for any discharge medications will be authorized once you are discharged, as it is imperative that you return to your primary care physician (or establish a relationship with a primary care physician if you do not have one) for your aftercare needs so that they can reassess your need for medications and monitor your lab values.

## 2015-08-09 NOTE — Progress Notes (Signed)
Patient discharge teaching given, including activity, diet, follow-up appoints, and medications. Patient verbalized understanding of all discharge instructions. IV access was d/c'd. Vitals are stable. Skin is intact except as charted in most recent assessments. Pt to be transported home via ambulance service.  Peri Maris, MBA, BS, RN

## 2015-08-09 NOTE — Patient Outreach (Signed)
[facility]) Care Management  08/09/2015  Madison [name] 09/06/1961 [id number]   Referral from Raiford Noble, RN, assigned Emilia Beck, RN to outreach patient.  Thanks, [name]. Sharlee Blew [facility] Care Management [facility] CM Assistant Phone: [phone] Fax: [phone]

## 2015-08-09 NOTE — Care Management Note (Signed)
Case Management Note  Patient Details  Name: Madison [name] MRN: [id number] Date of Birth: July 29, 1961  Subjective/Objective:   CM following for progression and d/c planning.                 Action/Plan: Pt will d/c to home of sister, address provided to [facility] , orders for [facility] services varified with [facility].   Expected Discharge Date:       08/09/2015           Expected Discharge Plan:  Home w Home Health Services  In-House Referral:  Clinical Social Work  Discharge planning Services  CM Consult, Indigent [facility] Clinic  Post Acute Care Choice:  Durable Medical Equipment Choice offered to:  Patient  DME Arranged:  3-N-1, Walker rolling, Wheelchair manual DME Agency:  [facility]  HH Arranged:  RN, PT, OT, Nurse's Aide, Social Work [employer] Agency:  [facility]  Status of Service:  Complete  Medicare Important Message Given:  Yes-third notification given Date Medicare IM Given:    Medicare IM give by:    Date Additional Medicare IM Given:    Additional Medicare Important Message give by:     If discussed at Long Length of Stay Meetings, dates discussed:    Additional Comments: 08/09/2015 [facility] notified of need for labs q48hr to varify that this can be preformed .  Madison [name], Madison Major, RN 08/09/2015, 11:28 AM

## 2015-08-09 NOTE — Clinical Social Work Note (Cosign Needed)
Patient is ready for discharge today and is requesting ambulance transport home.  CSW Intern spoke with Ms. Ellington to confirm her sister's address. Medical Necessity form completed and [facility] contacted for transport.  Baldo Daub Clinical Social Work Intern Preceptor: Genelle Bal [phone]

## 2015-08-10 ENCOUNTER — Ambulatory Visit (HOSPITAL_COMMUNITY): Payer: Medicaid Other

## 2015-08-10 DIAGNOSIS — M0600 Rheumatoid arthritis without rheumatoid factor, unspecified site: Secondary | ICD-10-CM | POA: Diagnosis not present

## 2015-08-10 DIAGNOSIS — E1065 Type 1 diabetes mellitus with hyperglycemia: Secondary | ICD-10-CM | POA: Diagnosis not present

## 2015-08-10 DIAGNOSIS — E785 Hyperlipidemia, unspecified: Secondary | ICD-10-CM | POA: Diagnosis not present

## 2015-08-10 DIAGNOSIS — I5042 Chronic combined systolic (congestive) and diastolic (congestive) heart failure: Secondary | ICD-10-CM | POA: Diagnosis not present

## 2015-08-10 DIAGNOSIS — I10 Essential (primary) hypertension: Secondary | ICD-10-CM | POA: Diagnosis not present

## 2015-08-10 DIAGNOSIS — M6281 Muscle weakness (generalized): Secondary | ICD-10-CM | POA: Diagnosis not present

## 2015-08-10 DIAGNOSIS — R1314 Dysphagia, pharyngoesophageal phase: Secondary | ICD-10-CM | POA: Diagnosis not present

## 2015-08-10 DIAGNOSIS — I2510 Atherosclerotic heart disease of native coronary artery without angina pectoris: Secondary | ICD-10-CM | POA: Diagnosis not present

## 2015-08-10 DIAGNOSIS — S82852D Displaced trimalleolar fracture of left lower leg, subsequent encounter for closed fracture with routine healing: Secondary | ICD-10-CM | POA: Diagnosis not present

## 2015-08-11 ENCOUNTER — Other Ambulatory Visit: Payer: Self-pay | Admitting: *Deleted

## 2015-08-11 ENCOUNTER — Telehealth: Payer: Self-pay | Admitting: Adult Health

## 2015-08-11 ENCOUNTER — Telehealth: Payer: Self-pay | Admitting: *Deleted

## 2015-08-11 ENCOUNTER — Encounter: Payer: Self-pay | Admitting: *Deleted

## 2015-08-11 ENCOUNTER — Other Ambulatory Visit: Payer: Self-pay

## 2015-08-11 DIAGNOSIS — I10 Essential (primary) hypertension: Secondary | ICD-10-CM | POA: Diagnosis not present

## 2015-08-11 DIAGNOSIS — M6281 Muscle weakness (generalized): Secondary | ICD-10-CM | POA: Diagnosis not present

## 2015-08-11 DIAGNOSIS — R1314 Dysphagia, pharyngoesophageal phase: Secondary | ICD-10-CM | POA: Diagnosis not present

## 2015-08-11 DIAGNOSIS — E1065 Type 1 diabetes mellitus with hyperglycemia: Secondary | ICD-10-CM | POA: Diagnosis not present

## 2015-08-11 DIAGNOSIS — M0600 Rheumatoid arthritis without rheumatoid factor, unspecified site: Secondary | ICD-10-CM | POA: Diagnosis not present

## 2015-08-11 DIAGNOSIS — I2510 Atherosclerotic heart disease of native coronary artery without angina pectoris: Secondary | ICD-10-CM | POA: Diagnosis not present

## 2015-08-11 DIAGNOSIS — E785 Hyperlipidemia, unspecified: Secondary | ICD-10-CM | POA: Diagnosis not present

## 2015-08-11 DIAGNOSIS — I5042 Chronic combined systolic (congestive) and diastolic (congestive) heart failure: Secondary | ICD-10-CM | POA: Diagnosis not present

## 2015-08-11 DIAGNOSIS — S82852D Displaced trimalleolar fracture of left lower leg, subsequent encounter for closed fracture with routine healing: Secondary | ICD-10-CM | POA: Diagnosis not present

## 2015-08-11 DIAGNOSIS — E119 Type 2 diabetes mellitus without complications: Secondary | ICD-10-CM

## 2015-08-11 NOTE — Telephone Encounter (Signed)
I gave verbal order for patient to have Occupational Therapy 2 times a week for 6 weeks. Per [name] ok to have home health aid to assist with bathing and Physical Therapy.

## 2015-08-11 NOTE — Telephone Encounter (Signed)
Madison [name] w/ [facility] needs verbal home health orders for physical therapy  2 X /wk for 4 wks  Also would like an order for home health aid for dressing and bathing.

## 2015-08-11 NOTE — Telephone Encounter (Signed)
Attempted to contact patient for Transitional Care Management call. Patient was sleeping. Asked to call office back.

## 2015-08-11 NOTE — Patient Outreach (Signed)
Initial telephone contact with patient's sister for community care coordination. Sister provided patient's date of birth and address to satisfy HIPPA identifiers.  Patient states her sister, Raylan, is currently asleep and is not able to talk.   Patient's sister stated she was available to talk and would be able to answer questions.  Patient's sister stated currently they are having a problem with getting patient in and out of the house because she (sister) was not able to lift patient into the car.  LCSW referral made for transportation assistance.     Plan: Telephone call, transition of care call #2

## 2015-08-11 NOTE — Telephone Encounter (Signed)
Spoke with Enrique Sack from [facility]. Per [name] ok to give verbal orders regarding PT and home health aid.

## 2015-08-11 NOTE — Telephone Encounter (Signed)
Ok for patient to have physical therapy two times a week for 4 weeks.   She can also have home health aid for dressing and bathing

## 2015-08-11 NOTE — Telephone Encounter (Signed)
Maurene Capes call from [facility] home health to ask for verbal orders for OT 2 times a week for 6 weeks   [phone]

## 2015-08-11 NOTE — Telephone Encounter (Signed)
See below

## 2015-08-11 NOTE — Patient Outreach (Signed)
[facility]) Care Management  08/11/2015  Madison [name] 17-Sep-1961 [id number]   CSW received a request from patient's RNCM with [facility] Care Management, Madison [name] requesting that CSW assist patient with arranging transportation to her follow-up appointment with her Primary Care Physician, Dr. [name] for post hospitalization appointment. Patient recently fell, breaking her left foot.  Patient is at high risk for falls due to left-sided weakness resulting from a previous stroke.  Patient has been diagnosed with Diabetes Mellitus.    Unfortunately, CSW is unable to arrange transportation for patient through [employer] or [employer], at the expense of [facility] Care Management, as patient has [facility], needing to first utilize the free transportation offered through her Human benefit.  Patient simply needs to contact [employer] [phone]) to schedule her transportation.  Patient is entitled to 6 round-trip rides through [employer].  After patient has exhausted this resource, she is eligible to receive free transportation through [employer] and/or [employer] [phone]).  CSW provided all of this information to patient while working with her in the past.  CSW has encouraged Madison [name] to notify CSW if additional transportation resources need to be mailed to patient's home for her review.  CSW will not open patient's case, unless otherwise notified by Madison [name] or Dr. Evelene Croon.  CSW will fax a correspondence letter to Dr. Evelene Croon to ensure that Dr. Evelene Croon is aware of patient's transportation benefits.  Danford Bad, BSW, MSW, LCSW  Licensed Restaurant manager, [employer] [facility]  Mailing [place] [address], [place], [place] [address] Physical Address-[address], [place], [place] [address] Toll Free Main # [phone] Fax # [phone] Cell # [phone]  Fax  # [phone]  [name]

## 2015-08-11 NOTE — Patient Outreach (Signed)
[facility]) Care Management  08/11/2015  Madison [name] 1961/09/24 [id number]   Request from Emilia Beck, RN to assign SW, assigned [employer], LCSW.  Thanks, [name]. Sharlee Blew [facility] Care Management [facility] CM Assistant Phone: [phone] Fax: [phone]

## 2015-08-12 ENCOUNTER — Ambulatory Visit (HOSPITAL_COMMUNITY): Payer: Medicaid Other

## 2015-08-12 ENCOUNTER — Other Ambulatory Visit: Payer: Self-pay | Admitting: Family

## 2015-08-12 ENCOUNTER — Telehealth: Payer: Self-pay | Admitting: Adult Health

## 2015-08-12 DIAGNOSIS — S82852D Displaced trimalleolar fracture of left lower leg, subsequent encounter for closed fracture with routine healing: Secondary | ICD-10-CM | POA: Diagnosis not present

## 2015-08-12 DIAGNOSIS — I2510 Atherosclerotic heart disease of native coronary artery without angina pectoris: Secondary | ICD-10-CM | POA: Diagnosis not present

## 2015-08-12 DIAGNOSIS — E1065 Type 1 diabetes mellitus with hyperglycemia: Secondary | ICD-10-CM | POA: Diagnosis not present

## 2015-08-12 DIAGNOSIS — M0600 Rheumatoid arthritis without rheumatoid factor, unspecified site: Secondary | ICD-10-CM | POA: Diagnosis not present

## 2015-08-12 DIAGNOSIS — I5042 Chronic combined systolic (congestive) and diastolic (congestive) heart failure: Secondary | ICD-10-CM | POA: Diagnosis not present

## 2015-08-12 DIAGNOSIS — E785 Hyperlipidemia, unspecified: Secondary | ICD-10-CM | POA: Diagnosis not present

## 2015-08-12 DIAGNOSIS — R1314 Dysphagia, pharyngoesophageal phase: Secondary | ICD-10-CM | POA: Diagnosis not present

## 2015-08-12 DIAGNOSIS — I10 Essential (primary) hypertension: Secondary | ICD-10-CM | POA: Diagnosis not present

## 2015-08-12 DIAGNOSIS — M6281 Muscle weakness (generalized): Secondary | ICD-10-CM | POA: Diagnosis not present

## 2015-08-12 LAB — POCT INR: INR: 1.8

## 2015-08-12 NOTE — Telephone Encounter (Signed)
Please get doctor Burchette to advise

## 2015-08-12 NOTE — Telephone Encounter (Signed)
[name]  would like an order for platform drop arm  bedside commode fax to teresa [phone]

## 2015-08-12 NOTE — Telephone Encounter (Signed)
Per Dr Caryl Never  1. Continue lovenox if still taking 2.  Take an extra 5 mg of coumadin today 3.  Continue 5 mg Saturday and Sunday 4.  Check PT/INR Monday Verbal orders given to Crystal and verbal read back.

## 2015-08-12 NOTE — Telephone Encounter (Signed)
Crystal is calling to report patient  PROTIME 21.5 and INR 1.8. Pt last took 5 mg of coumadin yesterday . Please advise

## 2015-08-15 ENCOUNTER — Ambulatory Visit (HOSPITAL_COMMUNITY): Payer: Medicaid Other

## 2015-08-15 ENCOUNTER — Ambulatory Visit (INDEPENDENT_AMBULATORY_CARE_PROVIDER_SITE_OTHER): Payer: Commercial Managed Care - HMO | Admitting: General Practice

## 2015-08-15 DIAGNOSIS — S82852D Displaced trimalleolar fracture of left lower leg, subsequent encounter for closed fracture with routine healing: Secondary | ICD-10-CM | POA: Diagnosis not present

## 2015-08-15 DIAGNOSIS — I5042 Chronic combined systolic (congestive) and diastolic (congestive) heart failure: Secondary | ICD-10-CM | POA: Diagnosis not present

## 2015-08-15 DIAGNOSIS — E1065 Type 1 diabetes mellitus with hyperglycemia: Secondary | ICD-10-CM | POA: Diagnosis not present

## 2015-08-15 DIAGNOSIS — E785 Hyperlipidemia, unspecified: Secondary | ICD-10-CM | POA: Diagnosis not present

## 2015-08-15 DIAGNOSIS — Z952 Presence of prosthetic heart valve: Secondary | ICD-10-CM

## 2015-08-15 DIAGNOSIS — I2510 Atherosclerotic heart disease of native coronary artery without angina pectoris: Secondary | ICD-10-CM | POA: Diagnosis not present

## 2015-08-15 DIAGNOSIS — M6281 Muscle weakness (generalized): Secondary | ICD-10-CM | POA: Diagnosis not present

## 2015-08-15 DIAGNOSIS — R1314 Dysphagia, pharyngoesophageal phase: Secondary | ICD-10-CM | POA: Diagnosis not present

## 2015-08-15 DIAGNOSIS — Z954 Presence of other heart-valve replacement: Secondary | ICD-10-CM

## 2015-08-15 DIAGNOSIS — M0600 Rheumatoid arthritis without rheumatoid factor, unspecified site: Secondary | ICD-10-CM | POA: Diagnosis not present

## 2015-08-15 DIAGNOSIS — I10 Essential (primary) hypertension: Secondary | ICD-10-CM | POA: Diagnosis not present

## 2015-08-15 NOTE — Telephone Encounter (Signed)
Ok to order 

## 2015-08-15 NOTE — Progress Notes (Signed)
Pre visit review using our clinic review tool, if applicable. No additional management support is needed unless otherwise documented below in the visit note. 

## 2015-08-16 ENCOUNTER — Telehealth: Payer: Self-pay

## 2015-08-16 ENCOUNTER — Ambulatory Visit (INDEPENDENT_AMBULATORY_CARE_PROVIDER_SITE_OTHER): Payer: Commercial Managed Care - HMO | Admitting: General Practice

## 2015-08-16 DIAGNOSIS — Z952 Presence of prosthetic heart valve: Secondary | ICD-10-CM

## 2015-08-16 DIAGNOSIS — I5042 Chronic combined systolic (congestive) and diastolic (congestive) heart failure: Secondary | ICD-10-CM | POA: Diagnosis not present

## 2015-08-16 DIAGNOSIS — I10 Essential (primary) hypertension: Secondary | ICD-10-CM | POA: Diagnosis not present

## 2015-08-16 DIAGNOSIS — R1314 Dysphagia, pharyngoesophageal phase: Secondary | ICD-10-CM | POA: Diagnosis not present

## 2015-08-16 DIAGNOSIS — S82852D Displaced trimalleolar fracture of left lower leg, subsequent encounter for closed fracture with routine healing: Secondary | ICD-10-CM | POA: Diagnosis not present

## 2015-08-16 DIAGNOSIS — E1065 Type 1 diabetes mellitus with hyperglycemia: Secondary | ICD-10-CM | POA: Diagnosis not present

## 2015-08-16 DIAGNOSIS — Z954 Presence of other heart-valve replacement: Secondary | ICD-10-CM

## 2015-08-16 DIAGNOSIS — I2510 Atherosclerotic heart disease of native coronary artery without angina pectoris: Secondary | ICD-10-CM | POA: Diagnosis not present

## 2015-08-16 DIAGNOSIS — E785 Hyperlipidemia, unspecified: Secondary | ICD-10-CM | POA: Diagnosis not present

## 2015-08-16 DIAGNOSIS — M6281 Muscle weakness (generalized): Secondary | ICD-10-CM | POA: Diagnosis not present

## 2015-08-16 DIAGNOSIS — M0600 Rheumatoid arthritis without rheumatoid factor, unspecified site: Secondary | ICD-10-CM | POA: Diagnosis not present

## 2015-08-16 LAB — POCT INR: INR: 2.8

## 2015-08-16 NOTE — Progress Notes (Signed)
Pre visit review using our clinic review tool, if applicable. No additional management support is needed unless otherwise documented below in the visit note. 

## 2015-08-16 NOTE — Telephone Encounter (Signed)
[name] called and states she checked pt's PT/INR yesterday and it was PT 2.8 and INR 32.7.  Pt is taking 5 mg of coumadin and has not had any bleeding. [name] will go back to pt's home on Thursday Sept. 29th.  Pls advise when pt's next PT/INR should be taken.

## 2015-08-16 NOTE — Telephone Encounter (Signed)
Impaired gait should work

## 2015-08-16 NOTE — Telephone Encounter (Signed)
What diagnosis code?

## 2015-08-17 ENCOUNTER — Other Ambulatory Visit: Payer: Self-pay | Admitting: *Deleted

## 2015-08-17 ENCOUNTER — Ambulatory Visit (INDEPENDENT_AMBULATORY_CARE_PROVIDER_SITE_OTHER): Payer: Commercial Managed Care - HMO | Admitting: Adult Health

## 2015-08-17 ENCOUNTER — Ambulatory Visit (HOSPITAL_COMMUNITY): Payer: Medicaid Other

## 2015-08-17 ENCOUNTER — Encounter: Payer: Self-pay | Admitting: Adult Health

## 2015-08-17 VITALS — BP 120/58 | HR 94 | Temp 98.9°F | Ht 63.0 in | Wt 156.0 lb

## 2015-08-17 DIAGNOSIS — J9600 Acute respiratory failure, unspecified whether with hypoxia or hypercapnia: Secondary | ICD-10-CM

## 2015-08-17 DIAGNOSIS — J189 Pneumonia, unspecified organism: Secondary | ICD-10-CM | POA: Diagnosis not present

## 2015-08-17 DIAGNOSIS — E875 Hyperkalemia: Secondary | ICD-10-CM | POA: Diagnosis not present

## 2015-08-17 DIAGNOSIS — Z09 Encounter for follow-up examination after completed treatment for conditions other than malignant neoplasm: Secondary | ICD-10-CM

## 2015-08-17 DIAGNOSIS — E1311 Other specified diabetes mellitus with ketoacidosis with coma: Secondary | ICD-10-CM | POA: Diagnosis not present

## 2015-08-17 DIAGNOSIS — R6889 Other general symptoms and signs: Secondary | ICD-10-CM | POA: Diagnosis not present

## 2015-08-17 DIAGNOSIS — N179 Acute kidney failure, unspecified: Secondary | ICD-10-CM

## 2015-08-17 DIAGNOSIS — R791 Abnormal coagulation profile: Secondary | ICD-10-CM

## 2015-08-17 DIAGNOSIS — Z23 Encounter for immunization: Secondary | ICD-10-CM

## 2015-08-17 MED ORDER — PRAVASTATIN SODIUM 20 MG PO TABS: 20.0000 mg | ORAL_TABLET | Freq: Every day | ORAL | Status: DC

## 2015-08-17 NOTE — Patient Instructions (Addendum)
It was great seeing you today and I am happy that you are feeling better!  I will follow up with you regarding your lab work and chest x ray.   Follow up with me in 2 months for a follow up. Please let me know if you need anything.   Continue to monitor your blood sugar and inform myself and your diabetes doctor if they continue to be low.

## 2015-08-17 NOTE — Telephone Encounter (Signed)
Patient requested refill during office visit on statin. Rx sent in.

## 2015-08-17 NOTE — Progress Notes (Signed)
Pre visit review using our clinic review tool, if applicable. No additional management support is needed unless otherwise documented below in the visit note. 

## 2015-08-17 NOTE — Telephone Encounter (Signed)
Rx faxed to [facility]. Confirmation fax received.

## 2015-08-17 NOTE — Progress Notes (Signed)
Subjective:    Patient ID: Madison [name], female    DOB: [birth year], 54 y.o.   MRN: [id number]  HPI  54 year old female who presents to the office today for post hospital follow-up. She was brought into the emergency room on 07/30/2015 by EMS, after her home health nurse found the patient lying beside her bed unresponsive. Patient was incontinent of bowel bladder and was covered in emesis. She was intubated in the ER.Initial labs: NA 127, K >7.5, CO2 <5, sr cr 2.31, glucose 1284, albumin 3.2, troponin 0.0, WBC 43, Hgb 9.6, and platelets 425. She was admitted to the ICU for DKA, hyperkalemia, and acute respiratory failure secondary to altered mental status and pneumonia. She was intubated for 6 days  Djuna showed to her appointment today by herself. She reports that she feels much better since being discharged from the hospital. Currently she is staying with her sister and niece. Continues to have a home health aide come to the house multiple days a week to help with bathing  Has been going to OT/PT. has a follow-up with orthopedics on Friday  Deepika reports that since she's been discharged home her blood sugars have been trending low. This morning her blood sugar was 48 when she woke up. She is doing sliding scale insulin. She endorses having a good appetite, eating appropriately. Her only complaint is she is unable to eat "sharp foods" due to irritation status post intubation.   Review of Systems  Constitutional: Positive for fatigue. Negative for fever, activity change, appetite change and unexpected weight change.  HENT: Negative.   Respiratory: Positive for cough. Negative for apnea, chest tightness, shortness of breath, wheezing and stridor.   Cardiovascular: Negative.   Gastrointestinal: Negative.   Genitourinary: Negative.   Musculoskeletal: Positive for myalgias, arthralgias and gait problem. Negative for joint swelling, neck pain and neck stiffness.  Skin: Positive for wound.    Neurological: Negative.   Hematological: Negative.   Psychiatric/Behavioral: Negative.   All other systems reviewed and are negative.  Past Medical History  Diagnosis Date  . CAD (coronary artery disease)   . Obesity   . Hypercholesteremia   . HTN (hypertension)   . Dyslipidemia   . Aortic stenosis   . Thrombophlebitis   . Heart murmur   . Myocardial infarction 12/2014  . Pneumonia 11/2014; 12/2014  . GERD (gastroesophageal reflux disease)   . 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 Anxiety   . Depression   . Allergy   . CHF (congestive heart failure)   . Rheumatoid arthritis(714.0)     "hands" (07/01/2015)  . Diabetes dx'd 1982    "went straight to insulin"    Social History   Social History  . Marital Status: Divorced    Spouse Name: N/A  . Number of Children: N/A  . Years of Education: N/A   Occupational History  . Not on file.   Social History Main Topics  . Smoking status: Never Smoker   . Smokeless tobacco: Never Used  . Alcohol Use: No  . Drug Use: No  . Sexual Activity: No   Other Topics Concern  . Not on file   Social History Narrative   Divroced and lives at home.    Has two sons who live in [place] at this time. (21&8 y/o).    Has a cat    Walks a lot.  She is having great success with her diet       Working with exercises that were given to her by PT/OT.     Past Surgical History  Procedure Laterality Date  . Cesarean section  1995  . Foot fracture surgery    . Knee arthroscopy Right   . Neuroplasty / transposition median nerve at carpal tunnel    . Left heart catheterization with coronary angiogram N/A 12/08/2014    Procedure: LEFT HEART CATHETERIZATION WITH CORONARY ANGIOGRAM;  Surgeon: Iran Ouch, MD;  Location: [facility] CATH LAB;  Service: Cardiovascular;  Laterality: N/A;  . Percutaneous coronary stent intervention (pci-s) N/A 12/09/2014    Procedure: PERCUTANEOUS  CORONARY STENT INTERVENTION (PCI-S);  Surgeon: [name], MD;  Location: [facility] CATH LAB;  Service: Cardiovascular;  Laterality: N/A;  . Cardiac catheterization  12/08/2014  . Coronary angioplasty with stent placement  12/09/2014  . Aortic valve replacement (avr)/coronary artery bypass grafting (cabg)  "2014"    [name] 03/08/2014  . Fracture surgery    . Tubal ligation  1995  . Cataract extraction Left   . Coronary artery bypass graft    . Orif ankle fracture Left 07/04/2015    Procedure: OPEN REDUCTION INTERNAL FIXATION (ORIF)  TRIMAL ANKLE FRACTURE;  Surgeon: [name], MD;  Location: [facility] OR;  Service: Orthopedics;  Laterality: Left;    Family History  Problem Relation Age of Onset  . Stroke    . Heart disease Mother   . Heart disease Sister     Allergies  Allergen Reactions  . Celebrex [Celecoxib] Rash  . Detrol [Tolterodine] Hives    Current Outpatient Prescriptions on File Prior to Visit  Medication Sig Dispense Refill  . acetaminophen (TYLENOL) 325 MG tablet Take 2 tablets (650 mg total) by mouth every 6 (six) hours as needed for mild pain (temp > 101.5).    Marland Kitchen aspirin 81 MG tablet Take 1 tablet (81 mg total) by mouth daily. 30 tablet   . baclofen (LIORESAL) 10 MG tablet TAKE 1 TABLET (10 MG TOTAL) BY MOUTH 2 (TWO) TIMES DAILY. 60 tablet 1  . enoxaparin (LOVENOX) 80 MG/0.8ML injection Inject 0.7 mLs (70 mg total) into the skin every 12 (twelve) hours. 14 Syringe 0  . escitalopram (LEXAPRO) 20 MG tablet Take 20 mg by mouth daily.    . insulin aspart (NOVOLOG) 100 UNIT/ML injection Inject 0-15 Units into the skin 3 (three) times daily with meals. 10 mL 11  . insulin detemir (LEVEMIR) 100 UNIT/ML injection Inject 0.26 mLs (26 Units total) into the skin at bedtime. 10 mL 11  . lisinopril (PRINIVIL,ZESTRIL) 2.5 MG tablet TAKE 1 TABLET BY MOUTH EVERY DAY 30 tablet 5  . metoprolol tartrate (LOPRESSOR) 25 MG tablet Take 12.5 mg by mouth 2 (two) times daily.    . mupirocin ointment  (BACTROBAN) 2 % Place into the nose 2 (two) times daily. Please apply to cheecks 22 g 0  . omeprazole (PRILOSEC) 40 MG capsule TAKE 1 CAPSULE (40 MG TOTAL) BY MOUTH DAILY. 30 capsule 6  . RHEUMATREX 2.5 MG tablet TAKE 1 TABLET 2 TIMES A WEEK (Patient taking differently: TAKE 1 TABLET 2 TIMES A WEEK (MON AND FRI)) 24 tablet 1  . solifenacin (VESICARE) 5 MG tablet Take 1 tablet (5 mg total) by mouth daily. 90 tablet 2  . traZODone (DESYREL) 50 MG tablet Take 0.5-1 tablets (25-50 mg total) by mouth at bedtime as needed for sleep. 10 tablet 0  . warfarin (COUMADIN)  5 MG tablet Take 1 tablet (5 mg total) by mouth daily. 30 tablet 0   No current facility-administered medications on file prior to visit.    BP 120/58 mmHg  Pulse 94  Temp(Src) 98.9 F (37.2 C) (Oral)  Ht 5\' 3"  (1.6 m)  Wt 156 lb (70.761 kg)  BMI 27.64 kg/m2  SpO2 98%       Objective:   Physical Exam  Constitutional: She is oriented to person, place, and time. She appears well-developed and well-nourished. No distress.  Speaks with soft slow voice due to irritation to throat after being intubated  Cardiovascular: Normal rate, regular rhythm, normal heart sounds and intact distal pulses.  Exam reveals no gallop and no friction rub.   No murmur heard. Pulmonary/Chest: Effort normal. No respiratory distress. She has no rales. She exhibits no tenderness.  Rhonchi at bilateral bases  Abdominal: Soft. Bowel sounds are normal. She exhibits no distension and no mass. There is no tenderness. There is no rebound and no guarding.  Musculoskeletal: She exhibits no edema or tenderness.  Is in wheel chair. Has CAM boot on left foot for fracture.   Neurological: She is alert and oriented to person, place, and time.  Skin: Skin is warm and dry. No rash noted. She is not diaphoretic. No erythema. No pallor.  Has well-healing wounds on bilateral cheeks from cheek ulcers  Psychiatric: She has a normal mood and affect. Her behavior is normal.  Judgment and thought content normal.  Nursing note and vitals reviewed.      Assessment & Plan:  1. Hospital discharge follow-up - CBC with Differential/Platelet - Basic metabolic panel - DG Chest 2 View; Future -Follow-up in 1-2 months, unless otherwise needed. -Reviewed hospital labs and imaging.  2. Supratherapeutic INR - INR yesterday was 2.8. Will check INR again tomorrow. Likely discontinue Lovenox therapy after INR check tomorrow. Continue with Coumadin 5 mg daily.  3. Hyperkalemia - Basic metabolic panel -Resolved prior to leaving hospital 4. Acute kidney injury -Resolved prior to leaving hospital - Basic metabolic panel  5. Diabetic ketoacidosis with coma associated with other specified diabetes mellitus -Resolved prior to leaving hospital - Basic metabolic panel -Tinny to monitor blood sugars. If she notices that they are coming to low for an extended period time, or becoming elevated with using sliding scale insulin. She is to follow-up with endocrinology.  6. Acute respiratory failure, unspecified whether with hypoxia or hypercapnia -Resolved prior to hospital discharge  7. Pneumonia, organism unspecified -Last chest x-ray done on 08/04/2015 showed "questionable mild left upper lobe infiltrate that remained area and mild left base subsegmental atelectasis. -He was treated with IV vancomycin and Zosyn in the hospital. Currently not on antibiotic therapy. - DG Chest 2 View; Future

## 2015-08-18 ENCOUNTER — Telehealth: Payer: Self-pay | Admitting: Adult Health

## 2015-08-18 DIAGNOSIS — E1065 Type 1 diabetes mellitus with hyperglycemia: Secondary | ICD-10-CM | POA: Diagnosis not present

## 2015-08-18 DIAGNOSIS — I2510 Atherosclerotic heart disease of native coronary artery without angina pectoris: Secondary | ICD-10-CM | POA: Diagnosis not present

## 2015-08-18 DIAGNOSIS — R1314 Dysphagia, pharyngoesophageal phase: Secondary | ICD-10-CM | POA: Diagnosis not present

## 2015-08-18 DIAGNOSIS — M6281 Muscle weakness (generalized): Secondary | ICD-10-CM | POA: Diagnosis not present

## 2015-08-18 DIAGNOSIS — E785 Hyperlipidemia, unspecified: Secondary | ICD-10-CM | POA: Diagnosis not present

## 2015-08-18 DIAGNOSIS — I10 Essential (primary) hypertension: Secondary | ICD-10-CM | POA: Diagnosis not present

## 2015-08-18 DIAGNOSIS — M0600 Rheumatoid arthritis without rheumatoid factor, unspecified site: Secondary | ICD-10-CM | POA: Diagnosis not present

## 2015-08-18 DIAGNOSIS — I5042 Chronic combined systolic (congestive) and diastolic (congestive) heart failure: Secondary | ICD-10-CM | POA: Diagnosis not present

## 2015-08-18 DIAGNOSIS — S82852D Displaced trimalleolar fracture of left lower leg, subsequent encounter for closed fracture with routine healing: Secondary | ICD-10-CM | POA: Diagnosis not present

## 2015-08-18 LAB — CBC WITH DIFFERENTIAL/PLATELET
BASOS ABS: 0.1 10*3/uL (ref 0.0–0.1)
Basophils Relative: 2.1 % (ref 0.0–3.0)
EOS PCT: 1.1 % (ref 0.0–5.0)
Eosinophils Absolute: 0.1 10*3/uL (ref 0.0–0.7)
HCT: 31.4 % — ABNORMAL LOW (ref 36.0–46.0)
Hemoglobin: 10 g/dL — ABNORMAL LOW (ref 12.0–15.0)
LYMPHS ABS: 2.4 10*3/uL (ref 0.7–4.0)
Lymphocytes Relative: 37.7 % (ref 12.0–46.0)
MCHC: 31.8 g/dL (ref 30.0–36.0)
MCV: 81.7 fl (ref 78.0–100.0)
MONO ABS: 0.2 10*3/uL (ref 0.1–1.0)
MONOS PCT: 3.6 % (ref 3.0–12.0)
NEUTROS ABS: 3.6 10*3/uL (ref 1.4–7.7)
NEUTROS PCT: 55.5 % (ref 43.0–77.0)
PLATELETS: 397 10*3/uL (ref 150.0–400.0)
RBC: 3.84 Mil/uL — ABNORMAL LOW (ref 3.87–5.11)
RDW: 22.7 % — ABNORMAL HIGH (ref 11.5–15.5)
WBC: 6.5 10*3/uL (ref 4.0–10.5)

## 2015-08-18 LAB — BASIC METABOLIC PANEL
BUN: 12 mg/dL (ref 6–23)
CALCIUM: 9.1 mg/dL (ref 8.4–10.5)
CO2: 25 mEq/L (ref 19–32)
CREATININE: 0.75 mg/dL (ref 0.40–1.20)
Chloride: 97 mEq/L (ref 96–112)
GFR: 85.38 mL/min (ref >60.00)
Glucose, Bld: 398 mg/dL — ABNORMAL HIGH (ref 70–99)
Potassium: 5.2 mEq/L — ABNORMAL HIGH (ref 3.5–5.1)
Sodium: 134 mEq/L — ABNORMAL LOW (ref 135–145)

## 2015-08-18 LAB — POCT INR: INR: 2.05

## 2015-08-18 NOTE — Telephone Encounter (Signed)
[facility] Night - Client TELEPHONE ADVICE RECORD [facility] Patient Name: Madison [name] Gender: Female DOB: January 31, 1961 Age: 54 Y 7 M 26 D Return Phone Number: Address: City/State/Zip: [place] [facility] - Client Client Site [facility] - Night Physician Evelena Peat Contact Type Call Call Type Triage / Clinical Caller Name Lucienne Minks Relationship To Patient Care Giver Return Phone Number Please choose phone number Chief Complaint Lab Result Initial Comment Alda Berthold, home health nurse, states she needs to call in a PT INR for a PT. CB# [phone],. CBWN Needs to get orders. Nurse Assessment Nurse: Roxana Hires, RN, [place] Date/Time (Eastern Time): 08/15/2015 9:26:12 PM Confirm and document reason for call. If symptomatic, describe symptoms. ---caller states she is [name] from home health. PT 2.8 INR 32.7. Taking 5 mg of coumadin. No bleeding Has the patient traveled out of the country within the last 30 days? ---No Does the patient require triage? ---No Guidelines Guideline Title Affirmed Question Affirmed Notes Nurse Date/Time (Eastern Time) Disp. Time Lamount Cohen Time) Disposition Final User 08/15/2015 9:13:10 PM Send To Call Back Waiting For Nurse Lorin Mercy 08/15/2015 9:35:04 PM Called On-Call Provider Roxana Hires, RN, [place] 08/15/2015 9:36:57 PM Call Completed Roxana Hires, RN, [place] 08/15/2015 9:37:30 PM Clinical Call Yes Roxana Hires, RN, [place] After Care Instructions Given Call Event Type User Date / Time Description Paging DoctorName Phone DateTime Result/Outcome Message Type Notes Genella Mech [phone] 08/15/2015 9:35:04 PM Called On Call Provider - Reached Doctor Paged Genella Mech 08/15/2015 9:35:15 PM Spoke with On Call - General Message Result spoke with oncall, stated no changes

## 2015-08-18 NOTE — Telephone Encounter (Signed)
Spoke to [place]. [name] advised patient to take 7.5 mg today and continue regimen as scheduled. Spoke to [place] with home health and she is aware.

## 2015-08-18 NOTE — Telephone Encounter (Signed)
[name] home care nurse call to report the patient's    Pt 2.05 and INR   29.8  [name] said the doctor need to know this today

## 2015-08-19 ENCOUNTER — Ambulatory Visit (HOSPITAL_COMMUNITY): Payer: Medicaid Other

## 2015-08-19 ENCOUNTER — Telehealth: Payer: Self-pay | Admitting: Endocrinology

## 2015-08-19 DIAGNOSIS — S82852D Displaced trimalleolar fracture of left lower leg, subsequent encounter for closed fracture with routine healing: Secondary | ICD-10-CM | POA: Diagnosis not present

## 2015-08-19 DIAGNOSIS — R6889 Other general symptoms and signs: Secondary | ICD-10-CM | POA: Diagnosis not present

## 2015-08-19 NOTE — Telephone Encounter (Signed)
Please reduce to 24 units qam Ov next week

## 2015-08-19 NOTE — Telephone Encounter (Signed)
See note below and please advise, Thanks! 

## 2015-08-19 NOTE — Telephone Encounter (Signed)
Attempted to reach the pt. Will try again at a later time.  

## 2015-08-19 NOTE — Telephone Encounter (Signed)
Every since the hospital put her on a sliding scale with 26 levemir every night and in the am it is always very low this AM it was 33

## 2015-08-22 ENCOUNTER — Ambulatory Visit (HOSPITAL_COMMUNITY): Payer: Medicaid Other

## 2015-08-22 ENCOUNTER — Ambulatory Visit (INDEPENDENT_AMBULATORY_CARE_PROVIDER_SITE_OTHER): Payer: Commercial Managed Care - HMO | Admitting: General Practice

## 2015-08-22 DIAGNOSIS — S82852D Displaced trimalleolar fracture of left lower leg, subsequent encounter for closed fracture with routine healing: Secondary | ICD-10-CM | POA: Diagnosis not present

## 2015-08-22 DIAGNOSIS — I10 Essential (primary) hypertension: Secondary | ICD-10-CM | POA: Diagnosis not present

## 2015-08-22 DIAGNOSIS — R1314 Dysphagia, pharyngoesophageal phase: Secondary | ICD-10-CM | POA: Diagnosis not present

## 2015-08-22 DIAGNOSIS — I2510 Atherosclerotic heart disease of native coronary artery without angina pectoris: Secondary | ICD-10-CM | POA: Diagnosis not present

## 2015-08-22 DIAGNOSIS — I5042 Chronic combined systolic (congestive) and diastolic (congestive) heart failure: Secondary | ICD-10-CM | POA: Diagnosis not present

## 2015-08-22 DIAGNOSIS — M6281 Muscle weakness (generalized): Secondary | ICD-10-CM | POA: Diagnosis not present

## 2015-08-22 DIAGNOSIS — E1065 Type 1 diabetes mellitus with hyperglycemia: Secondary | ICD-10-CM | POA: Diagnosis not present

## 2015-08-22 DIAGNOSIS — M0600 Rheumatoid arthritis without rheumatoid factor, unspecified site: Secondary | ICD-10-CM | POA: Diagnosis not present

## 2015-08-22 DIAGNOSIS — E785 Hyperlipidemia, unspecified: Secondary | ICD-10-CM | POA: Diagnosis not present

## 2015-08-22 NOTE — Telephone Encounter (Signed)
I contacted the pt and advised of note below. Pt voiced understanding. Appointment has been scheduled for 08/31/2015 at 10:45 am.

## 2015-08-22 NOTE — Progress Notes (Signed)
Pre visit review using our clinic review tool, if applicable. No additional management support is needed unless otherwise documented below in the visit note. 

## 2015-08-23 ENCOUNTER — Telehealth: Payer: Self-pay

## 2015-08-23 DIAGNOSIS — E785 Hyperlipidemia, unspecified: Secondary | ICD-10-CM | POA: Diagnosis not present

## 2015-08-23 DIAGNOSIS — I10 Essential (primary) hypertension: Secondary | ICD-10-CM | POA: Diagnosis not present

## 2015-08-23 DIAGNOSIS — M6281 Muscle weakness (generalized): Secondary | ICD-10-CM | POA: Diagnosis not present

## 2015-08-23 DIAGNOSIS — I2510 Atherosclerotic heart disease of native coronary artery without angina pectoris: Secondary | ICD-10-CM | POA: Diagnosis not present

## 2015-08-23 DIAGNOSIS — I5042 Chronic combined systolic (congestive) and diastolic (congestive) heart failure: Secondary | ICD-10-CM | POA: Diagnosis not present

## 2015-08-23 DIAGNOSIS — M0600 Rheumatoid arthritis without rheumatoid factor, unspecified site: Secondary | ICD-10-CM | POA: Diagnosis not present

## 2015-08-23 DIAGNOSIS — E1065 Type 1 diabetes mellitus with hyperglycemia: Secondary | ICD-10-CM | POA: Diagnosis not present

## 2015-08-23 DIAGNOSIS — R1314 Dysphagia, pharyngoesophageal phase: Secondary | ICD-10-CM | POA: Diagnosis not present

## 2015-08-23 DIAGNOSIS — S82852D Displaced trimalleolar fracture of left lower leg, subsequent encounter for closed fracture with routine healing: Secondary | ICD-10-CM | POA: Diagnosis not present

## 2015-08-23 NOTE — Telephone Encounter (Signed)
[name] called to state that pt's results are as follows: PT: 3.8 and INR: 46.2.

## 2015-08-23 NOTE — Telephone Encounter (Signed)
Spoke with Dr. Amador Cunas, he advised patient to hold coumadin just for today until [facility] can advise tomorrow moving forward. Spoke with [name] with home health and she is aware. Plans for [name], myself, or Alisha to follow-up with instructions tomorrow for patient. Home health would also like to know if they need to keep drawing her PT/INR or are her appointments with [facility] sufficient enough.   [name] [phone]

## 2015-08-24 ENCOUNTER — Ambulatory Visit (HOSPITAL_COMMUNITY): Payer: Medicaid Other

## 2015-08-24 DIAGNOSIS — I10 Essential (primary) hypertension: Secondary | ICD-10-CM | POA: Diagnosis not present

## 2015-08-24 DIAGNOSIS — S82852D Displaced trimalleolar fracture of left lower leg, subsequent encounter for closed fracture with routine healing: Secondary | ICD-10-CM | POA: Diagnosis not present

## 2015-08-24 DIAGNOSIS — I2510 Atherosclerotic heart disease of native coronary artery without angina pectoris: Secondary | ICD-10-CM | POA: Diagnosis not present

## 2015-08-24 DIAGNOSIS — M6281 Muscle weakness (generalized): Secondary | ICD-10-CM | POA: Diagnosis not present

## 2015-08-24 DIAGNOSIS — E785 Hyperlipidemia, unspecified: Secondary | ICD-10-CM | POA: Diagnosis not present

## 2015-08-24 DIAGNOSIS — M0600 Rheumatoid arthritis without rheumatoid factor, unspecified site: Secondary | ICD-10-CM | POA: Diagnosis not present

## 2015-08-24 DIAGNOSIS — E1065 Type 1 diabetes mellitus with hyperglycemia: Secondary | ICD-10-CM | POA: Diagnosis not present

## 2015-08-24 DIAGNOSIS — R1314 Dysphagia, pharyngoesophageal phase: Secondary | ICD-10-CM | POA: Diagnosis not present

## 2015-08-24 DIAGNOSIS — I5042 Chronic combined systolic (congestive) and diastolic (congestive) heart failure: Secondary | ICD-10-CM | POA: Diagnosis not present

## 2015-08-24 NOTE — Telephone Encounter (Signed)
Madison [name] is aware of recommendations.  Attempted to call pt and line is busy.  Will call again before the end of the day.   Called and spoke with pt and pt is aware of Cory's recommendations.  Advised pt that [name] would like her to go back to 5 mg and have a recheck on Friday. Pt verbalized understanding.

## 2015-08-24 NOTE — Telephone Encounter (Signed)
Correction: INR 3.8 PT: 46.2  Called and left a message for [name] to return call.

## 2015-08-24 NOTE — Telephone Encounter (Signed)
OK to restart Warfarin today. I think getting her checked at the clinic is fine and home health can stop. Do we know if she has stopped Lovenox? If not, we can cancel that as well.

## 2015-08-25 DIAGNOSIS — I10 Essential (primary) hypertension: Secondary | ICD-10-CM | POA: Diagnosis not present

## 2015-08-25 DIAGNOSIS — E1065 Type 1 diabetes mellitus with hyperglycemia: Secondary | ICD-10-CM | POA: Diagnosis not present

## 2015-08-25 DIAGNOSIS — M0600 Rheumatoid arthritis without rheumatoid factor, unspecified site: Secondary | ICD-10-CM | POA: Diagnosis not present

## 2015-08-25 DIAGNOSIS — I2510 Atherosclerotic heart disease of native coronary artery without angina pectoris: Secondary | ICD-10-CM | POA: Diagnosis not present

## 2015-08-25 DIAGNOSIS — I5042 Chronic combined systolic (congestive) and diastolic (congestive) heart failure: Secondary | ICD-10-CM | POA: Diagnosis not present

## 2015-08-25 DIAGNOSIS — M6281 Muscle weakness (generalized): Secondary | ICD-10-CM | POA: Diagnosis not present

## 2015-08-25 DIAGNOSIS — S82852D Displaced trimalleolar fracture of left lower leg, subsequent encounter for closed fracture with routine healing: Secondary | ICD-10-CM | POA: Diagnosis not present

## 2015-08-25 DIAGNOSIS — E785 Hyperlipidemia, unspecified: Secondary | ICD-10-CM | POA: Diagnosis not present

## 2015-08-25 DIAGNOSIS — R1314 Dysphagia, pharyngoesophageal phase: Secondary | ICD-10-CM | POA: Diagnosis not present

## 2015-08-26 ENCOUNTER — Telehealth: Payer: Self-pay | Admitting: Endocrinology

## 2015-08-26 ENCOUNTER — Ambulatory Visit (INDEPENDENT_AMBULATORY_CARE_PROVIDER_SITE_OTHER): Payer: Commercial Managed Care - HMO | Admitting: Family

## 2015-08-26 ENCOUNTER — Ambulatory Visit (HOSPITAL_COMMUNITY): Payer: Medicaid Other

## 2015-08-26 ENCOUNTER — Encounter: Payer: Self-pay | Admitting: Family

## 2015-08-26 ENCOUNTER — Other Ambulatory Visit (INDEPENDENT_AMBULATORY_CARE_PROVIDER_SITE_OTHER): Payer: Commercial Managed Care - HMO

## 2015-08-26 ENCOUNTER — Telehealth: Payer: Self-pay

## 2015-08-26 DIAGNOSIS — E785 Hyperlipidemia, unspecified: Secondary | ICD-10-CM | POA: Diagnosis not present

## 2015-08-26 DIAGNOSIS — R1314 Dysphagia, pharyngoesophageal phase: Secondary | ICD-10-CM | POA: Diagnosis not present

## 2015-08-26 DIAGNOSIS — M6281 Muscle weakness (generalized): Secondary | ICD-10-CM | POA: Diagnosis not present

## 2015-08-26 DIAGNOSIS — Z952 Presence of prosthetic heart valve: Secondary | ICD-10-CM

## 2015-08-26 DIAGNOSIS — I10 Essential (primary) hypertension: Secondary | ICD-10-CM | POA: Diagnosis not present

## 2015-08-26 DIAGNOSIS — S82852D Displaced trimalleolar fracture of left lower leg, subsequent encounter for closed fracture with routine healing: Secondary | ICD-10-CM | POA: Diagnosis not present

## 2015-08-26 DIAGNOSIS — Z954 Presence of other heart-valve replacement: Secondary | ICD-10-CM

## 2015-08-26 DIAGNOSIS — M0600 Rheumatoid arthritis without rheumatoid factor, unspecified site: Secondary | ICD-10-CM | POA: Diagnosis not present

## 2015-08-26 DIAGNOSIS — Z7901 Long term (current) use of anticoagulants: Secondary | ICD-10-CM

## 2015-08-26 DIAGNOSIS — E1065 Type 1 diabetes mellitus with hyperglycemia: Secondary | ICD-10-CM | POA: Diagnosis not present

## 2015-08-26 DIAGNOSIS — I2510 Atherosclerotic heart disease of native coronary artery without angina pectoris: Secondary | ICD-10-CM | POA: Diagnosis not present

## 2015-08-26 DIAGNOSIS — I5042 Chronic combined systolic (congestive) and diastolic (congestive) heart failure: Secondary | ICD-10-CM | POA: Diagnosis not present

## 2015-08-26 LAB — POCT INR: INR: 2.6

## 2015-08-26 NOTE — Telephone Encounter (Signed)
Madison [name] returned my call; she is aware of check today; these instructions given: Continue taking 7.5 mg today and then please continue to take 5 mg daily. Recheck 3 weeks Madison [name] is aware and will recheck pt around 10.27.2016.  Pt's appt cancelled for PT/INR on 10.27.2016

## 2015-08-26 NOTE — Telephone Encounter (Signed)
Pt came into the office today for PT/INR check.  Pt asked why she had to come into the office to have it checked when her nurse comes to the house twice a week.  Advised pt that per [name] pt was being tested in two places and [name] wanted pt checked in the office.  Pt states it is hard to get up and down the stairs where she lives.  Pt would like to know if she could have home health check her PT/INR to make it easier. Pls advise.

## 2015-08-26 NOTE — Telephone Encounter (Signed)
That is fine. We can have her home health RN check her PT/INR. Have them call with results.

## 2015-08-26 NOTE — Progress Notes (Signed)
   Subjective:    Patient ID: Madison [name], female    DOB: 1960/12/29, 54 y.o.   MRN: [id number]  HPI PT/INR   Review of Systems     Objective:   Physical Exam        Assessment & Plan:

## 2015-08-26 NOTE — Telephone Encounter (Signed)
Called and spoke with pt and pt is aware.  Advised pt that I called [name] and left a message for her to return my call.  I will advise of pt's coumadin reading, instructions and recheck.

## 2015-08-26 NOTE — Telephone Encounter (Signed)
This is most likely due to NPH being changed to levemir, which is too slow-acting for her. Please advise ov next week

## 2015-08-26 NOTE — Patient Instructions (Signed)
Continue taking 7.5 mg today and then please continue to take 5 mg daily.  Recheck 3 weeks  Anticoagulation Dose Instructions as of 08/26/2015      Glynis Smiles Tue Wed Thu Fri Sat   New Dose 5 mg 5 mg 5 mg 5 mg 5 mg 7.5 mg 5 mg    Description        Continue taking 7.5 mg today and then please continue to take 5 mg daily.  Recheck 3 weeks

## 2015-08-26 NOTE — Telephone Encounter (Signed)
Patient advised of note below and she will advise home health nurse. Pt scheduled for a office visit on 10/12.

## 2015-08-26 NOTE — Telephone Encounter (Signed)
See note below and please advise, Thanks! 

## 2015-08-26 NOTE — Telephone Encounter (Signed)
Madison [name] with [facility] # [phone].  Madison [name] has questions about insulin the pt's blood sugars are very low in AM at 97 and up to 367 during the day

## 2015-08-27 DIAGNOSIS — M069 Rheumatoid arthritis, unspecified: Secondary | ICD-10-CM | POA: Diagnosis not present

## 2015-08-27 DIAGNOSIS — S82855D Nondisplaced trimalleolar fracture of left lower leg, subsequent encounter for closed fracture with routine healing: Secondary | ICD-10-CM | POA: Diagnosis not present

## 2015-08-29 ENCOUNTER — Other Ambulatory Visit: Payer: Self-pay

## 2015-08-29 DIAGNOSIS — M0600 Rheumatoid arthritis without rheumatoid factor, unspecified site: Secondary | ICD-10-CM | POA: Diagnosis not present

## 2015-08-29 DIAGNOSIS — I5042 Chronic combined systolic (congestive) and diastolic (congestive) heart failure: Secondary | ICD-10-CM | POA: Diagnosis not present

## 2015-08-29 DIAGNOSIS — E1065 Type 1 diabetes mellitus with hyperglycemia: Secondary | ICD-10-CM | POA: Diagnosis not present

## 2015-08-29 DIAGNOSIS — S82852D Displaced trimalleolar fracture of left lower leg, subsequent encounter for closed fracture with routine healing: Secondary | ICD-10-CM | POA: Diagnosis not present

## 2015-08-29 DIAGNOSIS — E785 Hyperlipidemia, unspecified: Secondary | ICD-10-CM | POA: Diagnosis not present

## 2015-08-29 DIAGNOSIS — I10 Essential (primary) hypertension: Secondary | ICD-10-CM | POA: Diagnosis not present

## 2015-08-29 DIAGNOSIS — M6281 Muscle weakness (generalized): Secondary | ICD-10-CM | POA: Diagnosis not present

## 2015-08-29 DIAGNOSIS — I2510 Atherosclerotic heart disease of native coronary artery without angina pectoris: Secondary | ICD-10-CM | POA: Diagnosis not present

## 2015-08-29 DIAGNOSIS — R1314 Dysphagia, pharyngoesophageal phase: Secondary | ICD-10-CM | POA: Diagnosis not present

## 2015-08-29 NOTE — Patient Outreach (Signed)
Plan: Home visit on October 19 for continued community care coordination assessment

## 2015-08-30 DIAGNOSIS — E785 Hyperlipidemia, unspecified: Secondary | ICD-10-CM | POA: Diagnosis not present

## 2015-08-30 DIAGNOSIS — S82852D Displaced trimalleolar fracture of left lower leg, subsequent encounter for closed fracture with routine healing: Secondary | ICD-10-CM | POA: Diagnosis not present

## 2015-08-30 DIAGNOSIS — M0600 Rheumatoid arthritis without rheumatoid factor, unspecified site: Secondary | ICD-10-CM | POA: Diagnosis not present

## 2015-08-30 DIAGNOSIS — I5042 Chronic combined systolic (congestive) and diastolic (congestive) heart failure: Secondary | ICD-10-CM | POA: Diagnosis not present

## 2015-08-30 DIAGNOSIS — M6281 Muscle weakness (generalized): Secondary | ICD-10-CM | POA: Diagnosis not present

## 2015-08-30 DIAGNOSIS — E1065 Type 1 diabetes mellitus with hyperglycemia: Secondary | ICD-10-CM | POA: Diagnosis not present

## 2015-08-30 DIAGNOSIS — I10 Essential (primary) hypertension: Secondary | ICD-10-CM | POA: Diagnosis not present

## 2015-08-30 DIAGNOSIS — R1314 Dysphagia, pharyngoesophageal phase: Secondary | ICD-10-CM | POA: Diagnosis not present

## 2015-08-30 DIAGNOSIS — I2510 Atherosclerotic heart disease of native coronary artery without angina pectoris: Secondary | ICD-10-CM | POA: Diagnosis not present

## 2015-08-31 ENCOUNTER — Ambulatory Visit (INDEPENDENT_AMBULATORY_CARE_PROVIDER_SITE_OTHER): Payer: Commercial Managed Care - HMO | Admitting: Endocrinology

## 2015-08-31 ENCOUNTER — Telehealth: Payer: Self-pay | Admitting: Endocrinology

## 2015-08-31 ENCOUNTER — Encounter: Payer: Self-pay | Admitting: Endocrinology

## 2015-08-31 VITALS — BP 140/76 | HR 79 | Temp 98.6°F | Ht 63.0 in

## 2015-08-31 DIAGNOSIS — E785 Hyperlipidemia, unspecified: Secondary | ICD-10-CM | POA: Diagnosis not present

## 2015-08-31 LAB — POCT GLYCOSYLATED HEMOGLOBIN (HGB A1C): Hemoglobin A1C: 8.3

## 2015-08-31 MED ORDER — INSULIN ISOPHANE HUMAN 100 UNIT/ML KWIKPEN: 32.0000 [IU] | PEN_INJECTOR | SUBCUTANEOUS | Status: DC

## 2015-08-31 NOTE — Telephone Encounter (Signed)
Patient ask do she still take the Novalog with the sliding scale while she is taking the Humulin, please advise

## 2015-08-31 NOTE — Telephone Encounter (Signed)
Please advise below.

## 2015-08-31 NOTE — Patient Instructions (Addendum)
check your blood sugar twice a day.  vary the time of day when you check, between before the 3 meals, and at bedtime.  also check if you have symptoms of your blood sugar being too high or too low.  please keep a record of the readings and bring it to your next appointment here.  You can write it on any piece of paper.  please call us sooner if your blood sugar goes below 70, or if you have a lot of readings over 200.    On this type of insulin schedule, you should eat meals on a regular schedule.  If a meal is missed or significantly delayed, your blood sugar could go low. Please come back for a follow-up appointment in 2 months.     Please change both insulins back to the NPH, 32 units each morning.  However, if you are going to be active, take just 25 units.   This is probably not enough insulin, so please call us next week, to tell us how the blood sugar is doing.

## 2015-08-31 NOTE — Progress Notes (Signed)
Subjective:    Patient ID: Madison [name], female    DOB: [birth year], 54 y.o.   MRN: [id number]  HPI Pt returns for f/u of diabetes mellitus: DM type: Insulin-requiring type 2 Dx'ed: 1981 Complications: polyneuropathy, retinopathy, CVA's, and CAD.   Therapy: insulin since dx.   GDM: never. DKA: last episode was in 2016.  Severe hypoglycemia: last episode was in 2014.   Pancreatitis: never.  Other: she was on insulin pump rx until CVA's prevented her from using the device in early 2014; in early 2015, she was changed to a simple insulin schedule, due to poor results with multiple daily injections.  In 2015, she changed from levemir to NPH, due to the pattern of her cbg's.  Interval history: In the hospital, she was changed to qhs levemir, and prn novolog.  She is having frequent am hypoglycemia.  she brings a record of her cbg's which i have reviewed today.  It varies from 53-500.  It is in general higher as the day goes on Past Medical History  Diagnosis Date  . CAD (coronary artery disease)   . Obesity   . Hypercholesteremia   . HTN (hypertension)   . Dyslipidemia   . Aortic stenosis   . Thrombophlebitis   . Heart murmur   . Myocardial infarction (HCC) 12/2014  . Pneumonia 11/2014; 12/2014  . GERD (gastroesophageal reflux disease)   . Stroke syndrome [facility]) 1995; 2001    "when my son was born; problems w/speech and L hand since then" (01/19/2015)  . Anxiety   . Depression   . Allergy   . CHF (congestive heart failure) (HCC)   . Rheumatoid arthritis(714.0)     "hands" (07/01/2015)  . Diabetes (HCC) dx'd 1982    "went straight to insulin"    Past Surgical History  Procedure Laterality Date  . Cesarean section  1995  . Foot fracture surgery    . Knee arthroscopy Right   . Neuroplasty / transposition median nerve at carpal tunnel    . Left heart catheterization with coronary angiogram N/A 12/08/2014    Procedure: LEFT HEART CATHETERIZATION WITH CORONARY ANGIOGRAM;  Surgeon:  Iran Ouch, MD;  Location: [facility] CATH LAB;  Service: Cardiovascular;  Laterality: N/A;  . Percutaneous coronary stent intervention (pci-s) N/A 12/09/2014    Procedure: PERCUTANEOUS CORONARY STENT INTERVENTION (PCI-S);  Surgeon: [name], MD;  Location[facility] LAB;  Service: Cardiovascular;  Laterality: N/A;  . Cardiac catheterization  12/08/2014  . Coronary angioplasty with stent placement  12/09/2014  . Aortic valve replacement (avr)/coronary artery bypass grafting (cabg)  "2014"    [name] 03/08/2014  . Fracture surgery    . Tubal ligation  1995  . Cataract extraction Left   . Coronary artery bypass graft    . Orif ankle fracture Left 07/04/2015    Procedure: OPEN REDUCTION INTERNAL FIXATION (ORIF)  TRIMAL ANKLE FRACTURE;  Surgeon: [name], MD;  Location: [facility] OR;  Service: Orthopedics;  Laterality: Left;    Social History   Social History  . Marital Status: Divorced    Spouse Name: N/A  . Number of Children: N/A  . Years of Education: N/A   Occupational History  . Not on file.   Social History Main Topics  . Smoking status: Never Smoker   . Smokeless tobacco: Never Used  . Alcohol Use: No  . Drug Use: No  . Sexual Activity: No   Other Topics Concern  . Not on file  Social History Narrative   Divroced and lives at home.    Has two sons who live in [place] at this time. (21&8 y/o).    Has a cat    Walks a lot.       She is having great success with her diet       Working with exercises that were given to her by PT/OT.     Current Outpatient Prescriptions on File Prior to Visit  Medication Sig Dispense Refill  . acetaminophen (TYLENOL) 325 MG tablet Take 2 tablets (650 mg total) by mouth every 6 (six) hours as needed for mild pain (temp > 101.5).    Marland Kitchen aspirin 81 MG tablet Take 1 tablet (81 mg total) by mouth daily. 30 tablet   . baclofen (LIORESAL) 10 MG tablet TAKE 1 TABLET (10 MG TOTAL) BY MOUTH 2 (TWO) TIMES DAILY. 60 tablet 1  . enoxaparin (LOVENOX) 80  MG/0.8ML injection Inject 0.7 mLs (70 mg total) into the skin every 12 (twelve) hours. 14 Syringe 0  . escitalopram (LEXAPRO) 20 MG tablet Take 20 mg by mouth daily.    Marland Kitchen lisinopril (PRINIVIL,ZESTRIL) 2.5 MG tablet TAKE 1 TABLET BY MOUTH EVERY DAY 30 tablet 5  . metoprolol tartrate (LOPRESSOR) 25 MG tablet Take 12.5 mg by mouth 2 (two) times daily.    . mupirocin ointment (BACTROBAN) 2 % Place into the nose 2 (two) times daily. Please apply to cheecks 22 g 0  . omeprazole (PRILOSEC) 40 MG capsule TAKE 1 CAPSULE (40 MG TOTAL) BY MOUTH DAILY. 30 capsule 6  . pravastatin (PRAVACHOL) 20 MG tablet Take 1 tablet (20 mg total) by mouth daily. 90 tablet 0  . RHEUMATREX 2.5 MG tablet TAKE 1 TABLET 2 TIMES A WEEK (Patient taking differently: TAKE 1 TABLET 2 TIMES A WEEK (MON AND FRI)) 24 tablet 1  . solifenacin (VESICARE) 5 MG tablet Take 1 tablet (5 mg total) by mouth daily. 90 tablet 2  . traZODone (DESYREL) 50 MG tablet Take 0.5-1 tablets (25-50 mg total) by mouth at bedtime as needed for sleep. 10 tablet 0  . warfarin (COUMADIN) 5 MG tablet Take 1 tablet (5 mg total) by mouth daily. 30 tablet 0   No current facility-administered medications on file prior to visit.    Allergies  Allergen Reactions  . Other Other (See Comments)    Can burn skin if left on too long.  . Celebrex [Celecoxib] Rash  . Detrol [Tolterodine] Hives    Family History  Problem Relation Age of Onset  . Stroke    . Heart disease Mother   . Heart disease Sister     BP 140/76 mmHg  Pulse 79  Temp(Src) 98.6 F (37 C) (Oral)  Ht 5\' 3"  (1.6 m)  SpO2 90%  Review of Systems Denies LOC and weight change    Objective:   Physical Exam VITAL SIGNS:  See vs page.   GENERAL: no distress.  Left leg and foot are in a boot.  Pulses: right dorsalis pedis is intact.   MSK: no deformity of the right foot.  CV: no right leg edema. Skin:  no ulcer on the right foot.  normal color and temp on the right foot.  Neuro: sensation  is intact to touch on the right foot, but decreased from normal.  Lab Results  Component Value Date   HGBA1C 8.3 08/31/2015      Assessment & Plan:  DM: Based on the pattern of her cbg's, she needs  a faster-acting qd insulin.  Patient is advised the following: Patient Instructions  check your blood sugar twice a day.  vary the time of day when you check, between before the 3 meals, and at bedtime.  also check if you have symptoms of your blood sugar being too high or too low.  please keep a record of the readings and bring it to your next appointment here.  You can write it on any piece of paper.  please call us sooner if your blood sugar goes below 70, or if you have a lot of readings over 200.    On this type of insulin schedule, you should eat meals on a regular schedule.  If a meal is missed or significantly delayed, your blood sugar could go low. Please come back for a follow-up appointment in 2 months.     Please change both insulins back to the NPH, 32 units each morning.  However, if you are going to be active, take just 25 units.   This is probably not enough insulin, so please call us next week, to tell us how the blood sugar is doing.

## 2015-08-31 NOTE — Telephone Encounter (Signed)
Pt understands and she will try her best to minimize as she can

## 2015-08-31 NOTE — Telephone Encounter (Signed)
Ok, but please try to minimize, as you could go low.

## 2015-09-01 ENCOUNTER — Telehealth: Payer: Self-pay | Admitting: Endocrinology

## 2015-09-01 DIAGNOSIS — I10 Essential (primary) hypertension: Secondary | ICD-10-CM | POA: Diagnosis not present

## 2015-09-01 DIAGNOSIS — S82852D Displaced trimalleolar fracture of left lower leg, subsequent encounter for closed fracture with routine healing: Secondary | ICD-10-CM | POA: Diagnosis not present

## 2015-09-01 DIAGNOSIS — R1314 Dysphagia, pharyngoesophageal phase: Secondary | ICD-10-CM | POA: Diagnosis not present

## 2015-09-01 DIAGNOSIS — I5042 Chronic combined systolic (congestive) and diastolic (congestive) heart failure: Secondary | ICD-10-CM | POA: Diagnosis not present

## 2015-09-01 DIAGNOSIS — E785 Hyperlipidemia, unspecified: Secondary | ICD-10-CM | POA: Diagnosis not present

## 2015-09-01 DIAGNOSIS — I2510 Atherosclerotic heart disease of native coronary artery without angina pectoris: Secondary | ICD-10-CM | POA: Diagnosis not present

## 2015-09-01 DIAGNOSIS — M6281 Muscle weakness (generalized): Secondary | ICD-10-CM | POA: Diagnosis not present

## 2015-09-01 DIAGNOSIS — M0600 Rheumatoid arthritis without rheumatoid factor, unspecified site: Secondary | ICD-10-CM | POA: Diagnosis not present

## 2015-09-01 DIAGNOSIS — E1065 Type 1 diabetes mellitus with hyperglycemia: Secondary | ICD-10-CM | POA: Diagnosis not present

## 2015-09-01 NOTE — Telephone Encounter (Signed)
Please advise below.

## 2015-09-01 NOTE — Telephone Encounter (Signed)
Ok, starting tomorrow am, take NPH, 32 units each morning.

## 2015-09-01 NOTE — Telephone Encounter (Signed)
Pt is on 13 u in AM of insulin  This morning it was 68 After lunch she checked her sugar today it was 488 at 1130 took 10 u of novolog that she had left over. Now it is 382  Please advise

## 2015-09-02 ENCOUNTER — Telehealth: Payer: Self-pay | Admitting: Endocrinology

## 2015-09-02 DIAGNOSIS — M6281 Muscle weakness (generalized): Secondary | ICD-10-CM | POA: Diagnosis not present

## 2015-09-02 DIAGNOSIS — S82852D Displaced trimalleolar fracture of left lower leg, subsequent encounter for closed fracture with routine healing: Secondary | ICD-10-CM | POA: Diagnosis not present

## 2015-09-02 DIAGNOSIS — R1314 Dysphagia, pharyngoesophageal phase: Secondary | ICD-10-CM | POA: Diagnosis not present

## 2015-09-02 DIAGNOSIS — E1065 Type 1 diabetes mellitus with hyperglycemia: Secondary | ICD-10-CM | POA: Diagnosis not present

## 2015-09-02 DIAGNOSIS — I5042 Chronic combined systolic (congestive) and diastolic (congestive) heart failure: Secondary | ICD-10-CM | POA: Diagnosis not present

## 2015-09-02 DIAGNOSIS — E785 Hyperlipidemia, unspecified: Secondary | ICD-10-CM | POA: Diagnosis not present

## 2015-09-02 DIAGNOSIS — I2510 Atherosclerotic heart disease of native coronary artery without angina pectoris: Secondary | ICD-10-CM | POA: Diagnosis not present

## 2015-09-02 DIAGNOSIS — I10 Essential (primary) hypertension: Secondary | ICD-10-CM | POA: Diagnosis not present

## 2015-09-02 DIAGNOSIS — M0600 Rheumatoid arthritis without rheumatoid factor, unspecified site: Secondary | ICD-10-CM | POA: Diagnosis not present

## 2015-09-02 NOTE — Telephone Encounter (Signed)
Please increase to 35 units qam. Please call next week, to tell us how the blood sugar is doing

## 2015-09-02 NOTE — Telephone Encounter (Signed)
Called pt again to inform pt of medication routine per Dr.ellison

## 2015-09-02 NOTE — Telephone Encounter (Signed)
Pt just checked her blood sugar and it is 397. She is already taking 32 units in the morning. Please advise

## 2015-09-02 NOTE — Telephone Encounter (Signed)
Pt is aware of information from Dr.ellison stating that she is to increase to 35 units qam

## 2015-09-02 NOTE — Telephone Encounter (Signed)
Called pt to inform her of new medication routine and was not able to leave a message will try again this afternoon

## 2015-09-02 NOTE — Telephone Encounter (Signed)
Called pt to inform her of new medication routine

## 2015-09-02 NOTE — Telephone Encounter (Signed)
Please refer to previous telephone messages for current information about pt insulin and high blood sugar readings

## 2015-09-02 NOTE — Telephone Encounter (Signed)
Patient called stating that she was waiting on a return phone call    Please advise   Thank you

## 2015-09-05 ENCOUNTER — Telehealth: Payer: Self-pay | Admitting: Endocrinology

## 2015-09-05 NOTE — Telephone Encounter (Signed)
Patient B/S Sat morning  - 320  2:00 pm - 120,     8:00 pm  - 530,    9:30 pm - 410,   10:30 pm - 452  Sunday mor 147 Before lunch 391 Before dinner 190 Bedtime 348

## 2015-09-05 NOTE — Telephone Encounter (Signed)
Please advise below.

## 2015-09-05 NOTE — Telephone Encounter (Signed)
Ok, please increase to 37 units qam, but he don't want to push it low.

## 2015-09-05 NOTE — Telephone Encounter (Signed)
Please continue the same insulin (35 units qam) i'll see you next time.

## 2015-09-05 NOTE — Telephone Encounter (Signed)
Pt is aware that she is to take 37 units qam

## 2015-09-05 NOTE — Telephone Encounter (Signed)
Patient stated she is already taking 35 qam, and it is not working, she said she didn't want to got to the hospital. Please advise

## 2015-09-05 NOTE — Telephone Encounter (Signed)
PT is stating that she needs and increase due to her high blood sugars so she does not have to go to the hospital. 521 this am and 284 this afternoon then 208 this afternoon again.The pt is stating that she does not want to keep taking 35units because its not working.Please advise below

## 2015-09-06 DIAGNOSIS — E785 Hyperlipidemia, unspecified: Secondary | ICD-10-CM | POA: Diagnosis not present

## 2015-09-06 DIAGNOSIS — S82852D Displaced trimalleolar fracture of left lower leg, subsequent encounter for closed fracture with routine healing: Secondary | ICD-10-CM | POA: Diagnosis not present

## 2015-09-06 DIAGNOSIS — M0600 Rheumatoid arthritis without rheumatoid factor, unspecified site: Secondary | ICD-10-CM | POA: Diagnosis not present

## 2015-09-06 DIAGNOSIS — M6281 Muscle weakness (generalized): Secondary | ICD-10-CM | POA: Diagnosis not present

## 2015-09-06 DIAGNOSIS — I2510 Atherosclerotic heart disease of native coronary artery without angina pectoris: Secondary | ICD-10-CM | POA: Diagnosis not present

## 2015-09-06 DIAGNOSIS — R1314 Dysphagia, pharyngoesophageal phase: Secondary | ICD-10-CM | POA: Diagnosis not present

## 2015-09-06 DIAGNOSIS — I10 Essential (primary) hypertension: Secondary | ICD-10-CM | POA: Diagnosis not present

## 2015-09-06 DIAGNOSIS — E1065 Type 1 diabetes mellitus with hyperglycemia: Secondary | ICD-10-CM | POA: Diagnosis not present

## 2015-09-06 DIAGNOSIS — I5042 Chronic combined systolic (congestive) and diastolic (congestive) heart failure: Secondary | ICD-10-CM | POA: Diagnosis not present

## 2015-09-07 ENCOUNTER — Ambulatory Visit: Payer: Commercial Managed Care - HMO

## 2015-09-08 DIAGNOSIS — I5042 Chronic combined systolic (congestive) and diastolic (congestive) heart failure: Secondary | ICD-10-CM | POA: Diagnosis not present

## 2015-09-08 DIAGNOSIS — I2510 Atherosclerotic heart disease of native coronary artery without angina pectoris: Secondary | ICD-10-CM | POA: Diagnosis not present

## 2015-09-08 DIAGNOSIS — S82852D Displaced trimalleolar fracture of left lower leg, subsequent encounter for closed fracture with routine healing: Secondary | ICD-10-CM | POA: Diagnosis not present

## 2015-09-08 DIAGNOSIS — R1314 Dysphagia, pharyngoesophageal phase: Secondary | ICD-10-CM | POA: Diagnosis not present

## 2015-09-08 DIAGNOSIS — M6281 Muscle weakness (generalized): Secondary | ICD-10-CM | POA: Diagnosis not present

## 2015-09-08 DIAGNOSIS — I10 Essential (primary) hypertension: Secondary | ICD-10-CM | POA: Diagnosis not present

## 2015-09-08 DIAGNOSIS — M0600 Rheumatoid arthritis without rheumatoid factor, unspecified site: Secondary | ICD-10-CM | POA: Diagnosis not present

## 2015-09-08 DIAGNOSIS — E785 Hyperlipidemia, unspecified: Secondary | ICD-10-CM | POA: Diagnosis not present

## 2015-09-08 DIAGNOSIS — E1065 Type 1 diabetes mellitus with hyperglycemia: Secondary | ICD-10-CM | POA: Diagnosis not present

## 2015-09-09 ENCOUNTER — Other Ambulatory Visit: Payer: Self-pay

## 2015-09-09 DIAGNOSIS — I10 Essential (primary) hypertension: Secondary | ICD-10-CM | POA: Diagnosis not present

## 2015-09-09 DIAGNOSIS — S82852D Displaced trimalleolar fracture of left lower leg, subsequent encounter for closed fracture with routine healing: Secondary | ICD-10-CM | POA: Diagnosis not present

## 2015-09-09 DIAGNOSIS — I2510 Atherosclerotic heart disease of native coronary artery without angina pectoris: Secondary | ICD-10-CM | POA: Diagnosis not present

## 2015-09-09 DIAGNOSIS — M0600 Rheumatoid arthritis without rheumatoid factor, unspecified site: Secondary | ICD-10-CM | POA: Diagnosis not present

## 2015-09-09 DIAGNOSIS — E785 Hyperlipidemia, unspecified: Secondary | ICD-10-CM | POA: Diagnosis not present

## 2015-09-09 DIAGNOSIS — M6281 Muscle weakness (generalized): Secondary | ICD-10-CM | POA: Diagnosis not present

## 2015-09-09 DIAGNOSIS — E1065 Type 1 diabetes mellitus with hyperglycemia: Secondary | ICD-10-CM | POA: Diagnosis not present

## 2015-09-09 DIAGNOSIS — R1314 Dysphagia, pharyngoesophageal phase: Secondary | ICD-10-CM | POA: Diagnosis not present

## 2015-09-09 DIAGNOSIS — I5042 Chronic combined systolic (congestive) and diastolic (congestive) heart failure: Secondary | ICD-10-CM | POA: Diagnosis not present

## 2015-09-09 NOTE — Patient Outreach (Signed)
[facility]) Care Management  09/09/2015  Madison [name] 06/28/1961 [id number]   EMMI Video on HF viewed #[id number]

## 2015-09-12 ENCOUNTER — Other Ambulatory Visit: Payer: Self-pay | Admitting: *Deleted

## 2015-09-12 ENCOUNTER — Other Ambulatory Visit: Payer: Self-pay | Admitting: Cardiovascular Disease

## 2015-09-12 DIAGNOSIS — M0600 Rheumatoid arthritis without rheumatoid factor, unspecified site: Secondary | ICD-10-CM | POA: Diagnosis not present

## 2015-09-12 DIAGNOSIS — E785 Hyperlipidemia, unspecified: Secondary | ICD-10-CM | POA: Diagnosis not present

## 2015-09-12 DIAGNOSIS — M6281 Muscle weakness (generalized): Secondary | ICD-10-CM | POA: Diagnosis not present

## 2015-09-12 DIAGNOSIS — I10 Essential (primary) hypertension: Secondary | ICD-10-CM | POA: Diagnosis not present

## 2015-09-12 DIAGNOSIS — S82852D Displaced trimalleolar fracture of left lower leg, subsequent encounter for closed fracture with routine healing: Secondary | ICD-10-CM | POA: Diagnosis not present

## 2015-09-12 DIAGNOSIS — E1065 Type 1 diabetes mellitus with hyperglycemia: Secondary | ICD-10-CM | POA: Diagnosis not present

## 2015-09-12 DIAGNOSIS — I5042 Chronic combined systolic (congestive) and diastolic (congestive) heart failure: Secondary | ICD-10-CM | POA: Diagnosis not present

## 2015-09-12 DIAGNOSIS — I2510 Atherosclerotic heart disease of native coronary artery without angina pectoris: Secondary | ICD-10-CM | POA: Diagnosis not present

## 2015-09-12 DIAGNOSIS — R1314 Dysphagia, pharyngoesophageal phase: Secondary | ICD-10-CM | POA: Diagnosis not present

## 2015-09-12 MED ORDER — LISINOPRIL 2.5 MG PO TABS: 2.5000 mg | ORAL_TABLET | Freq: Every day | ORAL | Status: DC

## 2015-09-12 MED ORDER — OMEPRAZOLE 40 MG PO CPDR: DELAYED_RELEASE_CAPSULE | ORAL | Status: DC

## 2015-09-13 ENCOUNTER — Telehealth: Payer: Self-pay

## 2015-09-13 ENCOUNTER — Ambulatory Visit (INDEPENDENT_AMBULATORY_CARE_PROVIDER_SITE_OTHER): Payer: Commercial Managed Care - HMO | Admitting: General Practice

## 2015-09-13 ENCOUNTER — Other Ambulatory Visit: Payer: Self-pay | Admitting: Adult Health

## 2015-09-13 ENCOUNTER — Other Ambulatory Visit: Payer: Self-pay | Admitting: General Practice

## 2015-09-13 DIAGNOSIS — I2510 Atherosclerotic heart disease of native coronary artery without angina pectoris: Secondary | ICD-10-CM | POA: Diagnosis not present

## 2015-09-13 DIAGNOSIS — S82852D Displaced trimalleolar fracture of left lower leg, subsequent encounter for closed fracture with routine healing: Secondary | ICD-10-CM | POA: Diagnosis not present

## 2015-09-13 DIAGNOSIS — E1065 Type 1 diabetes mellitus with hyperglycemia: Secondary | ICD-10-CM | POA: Diagnosis not present

## 2015-09-13 DIAGNOSIS — I10 Essential (primary) hypertension: Secondary | ICD-10-CM | POA: Diagnosis not present

## 2015-09-13 DIAGNOSIS — R1314 Dysphagia, pharyngoesophageal phase: Secondary | ICD-10-CM | POA: Diagnosis not present

## 2015-09-13 DIAGNOSIS — E785 Hyperlipidemia, unspecified: Secondary | ICD-10-CM | POA: Diagnosis not present

## 2015-09-13 DIAGNOSIS — I5042 Chronic combined systolic (congestive) and diastolic (congestive) heart failure: Secondary | ICD-10-CM | POA: Diagnosis not present

## 2015-09-13 DIAGNOSIS — G4700 Insomnia, unspecified: Secondary | ICD-10-CM

## 2015-09-13 DIAGNOSIS — M0600 Rheumatoid arthritis without rheumatoid factor, unspecified site: Secondary | ICD-10-CM | POA: Diagnosis not present

## 2015-09-13 DIAGNOSIS — M6281 Muscle weakness (generalized): Secondary | ICD-10-CM | POA: Diagnosis not present

## 2015-09-13 LAB — POCT INR: INR: 2.8

## 2015-09-13 MED ORDER — WARFARIN SODIUM 5 MG PO TABS: ORAL_TABLET | ORAL | Status: DC

## 2015-09-13 MED ORDER — TRAZODONE HCL 50 MG PO TABS: 25.0000 mg | ORAL_TABLET | Freq: Every evening | ORAL | Status: DC | PRN

## 2015-09-13 NOTE — Telephone Encounter (Signed)
Madison [name] left a voicemail stating pt's INR 2.8 and PT 33.3.  Pls advise.

## 2015-09-13 NOTE — Telephone Encounter (Signed)
Ok to refill 

## 2015-09-13 NOTE — Progress Notes (Signed)
Pre visit review using our clinic review tool, if applicable. No additional management support is needed unless otherwise documented below in the visit note. 

## 2015-09-13 NOTE — Telephone Encounter (Signed)
Refill request form [facility] [address] for Trazodone 50 mg tablet-TAke 1/2 to 1 tablet by mouth at bedtime as needed for sleep #90.  Pls advise.

## 2015-09-13 NOTE — Progress Notes (Signed)
I have reviewed and agree with the plan. 

## 2015-09-14 ENCOUNTER — Other Ambulatory Visit: Payer: Self-pay | Admitting: Cardiovascular Disease

## 2015-09-14 DIAGNOSIS — R1314 Dysphagia, pharyngoesophageal phase: Secondary | ICD-10-CM | POA: Diagnosis not present

## 2015-09-14 DIAGNOSIS — S82852D Displaced trimalleolar fracture of left lower leg, subsequent encounter for closed fracture with routine healing: Secondary | ICD-10-CM | POA: Diagnosis not present

## 2015-09-14 DIAGNOSIS — M6281 Muscle weakness (generalized): Secondary | ICD-10-CM | POA: Diagnosis not present

## 2015-09-14 DIAGNOSIS — I10 Essential (primary) hypertension: Secondary | ICD-10-CM | POA: Diagnosis not present

## 2015-09-14 DIAGNOSIS — E1065 Type 1 diabetes mellitus with hyperglycemia: Secondary | ICD-10-CM | POA: Diagnosis not present

## 2015-09-14 DIAGNOSIS — M0600 Rheumatoid arthritis without rheumatoid factor, unspecified site: Secondary | ICD-10-CM | POA: Diagnosis not present

## 2015-09-14 DIAGNOSIS — I5042 Chronic combined systolic (congestive) and diastolic (congestive) heart failure: Secondary | ICD-10-CM | POA: Diagnosis not present

## 2015-09-14 DIAGNOSIS — I2510 Atherosclerotic heart disease of native coronary artery without angina pectoris: Secondary | ICD-10-CM | POA: Diagnosis not present

## 2015-09-14 DIAGNOSIS — E785 Hyperlipidemia, unspecified: Secondary | ICD-10-CM | POA: Diagnosis not present

## 2015-09-15 ENCOUNTER — Ambulatory Visit: Payer: Commercial Managed Care - HMO

## 2015-09-15 DIAGNOSIS — I2510 Atherosclerotic heart disease of native coronary artery without angina pectoris: Secondary | ICD-10-CM | POA: Diagnosis not present

## 2015-09-15 DIAGNOSIS — E785 Hyperlipidemia, unspecified: Secondary | ICD-10-CM | POA: Diagnosis not present

## 2015-09-15 DIAGNOSIS — M6281 Muscle weakness (generalized): Secondary | ICD-10-CM | POA: Diagnosis not present

## 2015-09-15 DIAGNOSIS — M0600 Rheumatoid arthritis without rheumatoid factor, unspecified site: Secondary | ICD-10-CM | POA: Diagnosis not present

## 2015-09-15 DIAGNOSIS — S82852D Displaced trimalleolar fracture of left lower leg, subsequent encounter for closed fracture with routine healing: Secondary | ICD-10-CM | POA: Diagnosis not present

## 2015-09-15 DIAGNOSIS — I10 Essential (primary) hypertension: Secondary | ICD-10-CM | POA: Diagnosis not present

## 2015-09-15 DIAGNOSIS — R1314 Dysphagia, pharyngoesophageal phase: Secondary | ICD-10-CM | POA: Diagnosis not present

## 2015-09-15 DIAGNOSIS — I5042 Chronic combined systolic (congestive) and diastolic (congestive) heart failure: Secondary | ICD-10-CM | POA: Diagnosis not present

## 2015-09-15 DIAGNOSIS — E1065 Type 1 diabetes mellitus with hyperglycemia: Secondary | ICD-10-CM | POA: Diagnosis not present

## 2015-09-16 ENCOUNTER — Ambulatory Visit (INDEPENDENT_AMBULATORY_CARE_PROVIDER_SITE_OTHER)
Admission: RE | Admit: 2015-09-16 | Discharge: 2015-09-16 | Disposition: A | Discharge status: Home / Self Care | Payer: Commercial Managed Care - HMO | Source: Ambulatory Visit | Attending: Adult Health | Admitting: Adult Health

## 2015-09-16 DIAGNOSIS — J189 Pneumonia, unspecified organism: Secondary | ICD-10-CM | POA: Diagnosis not present

## 2015-09-16 DIAGNOSIS — I2510 Atherosclerotic heart disease of native coronary artery without angina pectoris: Secondary | ICD-10-CM | POA: Diagnosis not present

## 2015-09-16 DIAGNOSIS — M0600 Rheumatoid arthritis without rheumatoid factor, unspecified site: Secondary | ICD-10-CM | POA: Diagnosis not present

## 2015-09-16 DIAGNOSIS — M6281 Muscle weakness (generalized): Secondary | ICD-10-CM | POA: Diagnosis not present

## 2015-09-16 DIAGNOSIS — Z09 Encounter for follow-up examination after completed treatment for conditions other than malignant neoplasm: Secondary | ICD-10-CM | POA: Diagnosis not present

## 2015-09-16 DIAGNOSIS — E1065 Type 1 diabetes mellitus with hyperglycemia: Secondary | ICD-10-CM | POA: Diagnosis not present

## 2015-09-16 DIAGNOSIS — I10 Essential (primary) hypertension: Secondary | ICD-10-CM | POA: Diagnosis not present

## 2015-09-16 DIAGNOSIS — R05 Cough: Secondary | ICD-10-CM | POA: Diagnosis not present

## 2015-09-16 DIAGNOSIS — R1314 Dysphagia, pharyngoesophageal phase: Secondary | ICD-10-CM | POA: Diagnosis not present

## 2015-09-16 DIAGNOSIS — E785 Hyperlipidemia, unspecified: Secondary | ICD-10-CM | POA: Diagnosis not present

## 2015-09-16 DIAGNOSIS — I5042 Chronic combined systolic (congestive) and diastolic (congestive) heart failure: Secondary | ICD-10-CM | POA: Diagnosis not present

## 2015-09-16 DIAGNOSIS — S82852D Displaced trimalleolar fracture of left lower leg, subsequent encounter for closed fracture with routine healing: Secondary | ICD-10-CM | POA: Diagnosis not present

## 2015-09-19 DIAGNOSIS — S82852D Displaced trimalleolar fracture of left lower leg, subsequent encounter for closed fracture with routine healing: Secondary | ICD-10-CM | POA: Diagnosis not present

## 2015-09-19 DIAGNOSIS — E785 Hyperlipidemia, unspecified: Secondary | ICD-10-CM | POA: Diagnosis not present

## 2015-09-19 DIAGNOSIS — E1065 Type 1 diabetes mellitus with hyperglycemia: Secondary | ICD-10-CM | POA: Diagnosis not present

## 2015-09-19 DIAGNOSIS — R1314 Dysphagia, pharyngoesophageal phase: Secondary | ICD-10-CM | POA: Diagnosis not present

## 2015-09-19 DIAGNOSIS — I5042 Chronic combined systolic (congestive) and diastolic (congestive) heart failure: Secondary | ICD-10-CM | POA: Diagnosis not present

## 2015-09-19 DIAGNOSIS — M6281 Muscle weakness (generalized): Secondary | ICD-10-CM | POA: Diagnosis not present

## 2015-09-19 DIAGNOSIS — M0600 Rheumatoid arthritis without rheumatoid factor, unspecified site: Secondary | ICD-10-CM | POA: Diagnosis not present

## 2015-09-19 DIAGNOSIS — I10 Essential (primary) hypertension: Secondary | ICD-10-CM | POA: Diagnosis not present

## 2015-09-19 DIAGNOSIS — I2510 Atherosclerotic heart disease of native coronary artery without angina pectoris: Secondary | ICD-10-CM | POA: Diagnosis not present

## 2015-09-20 DIAGNOSIS — M6281 Muscle weakness (generalized): Secondary | ICD-10-CM | POA: Diagnosis not present

## 2015-09-20 DIAGNOSIS — I10 Essential (primary) hypertension: Secondary | ICD-10-CM | POA: Diagnosis not present

## 2015-09-20 DIAGNOSIS — E785 Hyperlipidemia, unspecified: Secondary | ICD-10-CM | POA: Diagnosis not present

## 2015-09-20 DIAGNOSIS — R1314 Dysphagia, pharyngoesophageal phase: Secondary | ICD-10-CM | POA: Diagnosis not present

## 2015-09-20 DIAGNOSIS — I5042 Chronic combined systolic (congestive) and diastolic (congestive) heart failure: Secondary | ICD-10-CM | POA: Diagnosis not present

## 2015-09-20 DIAGNOSIS — E1065 Type 1 diabetes mellitus with hyperglycemia: Secondary | ICD-10-CM | POA: Diagnosis not present

## 2015-09-20 DIAGNOSIS — I2510 Atherosclerotic heart disease of native coronary artery without angina pectoris: Secondary | ICD-10-CM | POA: Diagnosis not present

## 2015-09-20 DIAGNOSIS — M0600 Rheumatoid arthritis without rheumatoid factor, unspecified site: Secondary | ICD-10-CM | POA: Diagnosis not present

## 2015-09-20 DIAGNOSIS — S82852D Displaced trimalleolar fracture of left lower leg, subsequent encounter for closed fracture with routine healing: Secondary | ICD-10-CM | POA: Diagnosis not present

## 2015-09-21 ENCOUNTER — Ambulatory Visit (INDEPENDENT_AMBULATORY_CARE_PROVIDER_SITE_OTHER): Payer: Commercial Managed Care - HMO | Admitting: Family

## 2015-09-21 ENCOUNTER — Encounter: Payer: Self-pay | Admitting: Family

## 2015-09-21 VITALS — BP 120/62 | HR 117 | Temp 98.7°F | Wt 151.0 lb

## 2015-09-21 DIAGNOSIS — I10 Essential (primary) hypertension: Secondary | ICD-10-CM | POA: Diagnosis not present

## 2015-09-21 DIAGNOSIS — I2510 Atherosclerotic heart disease of native coronary artery without angina pectoris: Secondary | ICD-10-CM | POA: Diagnosis not present

## 2015-09-21 DIAGNOSIS — E785 Hyperlipidemia, unspecified: Secondary | ICD-10-CM | POA: Diagnosis not present

## 2015-09-21 DIAGNOSIS — M129 Arthropathy, unspecified: Secondary | ICD-10-CM

## 2015-09-21 DIAGNOSIS — E1065 Type 1 diabetes mellitus with hyperglycemia: Secondary | ICD-10-CM | POA: Diagnosis not present

## 2015-09-21 DIAGNOSIS — S82852D Displaced trimalleolar fracture of left lower leg, subsequent encounter for closed fracture with routine healing: Secondary | ICD-10-CM | POA: Diagnosis not present

## 2015-09-21 DIAGNOSIS — R1314 Dysphagia, pharyngoesophageal phase: Secondary | ICD-10-CM | POA: Diagnosis not present

## 2015-09-21 DIAGNOSIS — M6281 Muscle weakness (generalized): Secondary | ICD-10-CM | POA: Diagnosis not present

## 2015-09-21 DIAGNOSIS — M19049 Primary osteoarthritis, unspecified hand: Secondary | ICD-10-CM

## 2015-09-21 DIAGNOSIS — I5042 Chronic combined systolic (congestive) and diastolic (congestive) heart failure: Secondary | ICD-10-CM | POA: Diagnosis not present

## 2015-09-21 DIAGNOSIS — M0600 Rheumatoid arthritis without rheumatoid factor, unspecified site: Secondary | ICD-10-CM | POA: Diagnosis not present

## 2015-09-21 DIAGNOSIS — M1990 Unspecified osteoarthritis, unspecified site: Secondary | ICD-10-CM

## 2015-09-21 DIAGNOSIS — M19039 Primary osteoarthritis, unspecified wrist: Secondary | ICD-10-CM

## 2015-09-21 NOTE — Patient Instructions (Signed)
Arthritis °Arthritis is a term that is commonly used to refer to joint pain or joint disease. There are more than 100 types of arthritis. °CAUSES °The most common cause of this condition is wear and tear of a joint. Other causes include: °· Gout. °· Inflammation of a joint. °· An infection of a joint. °· Sprains and other injuries near the joint. °· A drug reaction or allergic reaction. °In some cases, the cause may not be known. °SYMPTOMS °The main symptom of this condition is pain in the joint with movement. Other symptoms include: °· Redness, swelling, or stiffness at a joint. °· Warmth coming from the joint. °· Fever. °· Overall feeling of illness. °DIAGNOSIS °This condition may be diagnosed with a physical exam and tests, including: °· Blood tests. °· Urine tests. °· Imaging tests, such as MRI, X-rays, or a CT scan. °Sometimes, fluid is removed from a joint for testing. °TREATMENT °Treatment for this condition may involve: °· Treatment of the cause, if it is known. °· Rest. °· Raising (elevating) the joint. °· Applying cold or hot packs to the joint. °· Medicines to improve symptoms and reduce inflammation. °· Injections of a steroid such as cortisone into the joint to help reduce pain and inflammation. °Depending on the cause of your arthritis, you may need to make lifestyle changes to reduce stress on your joint. These changes may include exercising more and losing weight. °HOME CARE INSTRUCTIONS °Medicines °· Take over-the-counter and prescription medicines only as told by your health care provider. °· Do not take aspirin to relieve pain if gout is suspected. °Activities °· Rest your joint if told by your health care provider. Rest is important when your disease is active and your joint feels painful, swollen, or stiff. °· Avoid activities that make the pain worse. It is important to balance activity with rest. °· Exercise your joint regularly with range-of-motion exercises as told by your health care  provider. Try doing low-impact exercise, such as: °¨ Swimming. °¨ Water aerobics. °¨ Biking. °¨ Walking. °Joint Care °· If your joint is swollen, keep it elevated if told by your health care provider. °· If your joint feels stiff in the morning, try taking a warm shower. °· If directed, apply heat to the joint. If you have diabetes, do not apply heat without permission from your health care provider. °¨ Put a towel between the joint and the hot pack or heating pad. °¨ Leave the heat on the area for 20-30 minutes. °· If directed, apply ice to the joint: °¨ Put ice in a plastic bag. °¨ Place a towel between your skin and the bag. °¨ Leave the ice on for 20 minutes, 2-3 times per day. °· Keep all follow-up visits as told by your health care provider. This is important. °SEEK MEDICAL CARE IF: °· The pain gets worse. °· You have a fever. °SEEK IMMEDIATE MEDICAL CARE IF: °· You develop severe joint pain, swelling, or redness. °· Many joints become painful and swollen. °· You develop severe back pain. °· You develop severe weakness in your leg. °· You cannot control your bladder or bowels. °  °This information is not intended to replace advice given to you by your health care provider. Make sure you discuss any questions you have with your health care provider. °  °Document Released: 12/13/2004 Document Revised: 07/27/2015 Document Reviewed: 01/31/2015 °Elsevier Interactive Patient Education ©2016 [employer] ° °

## 2015-09-21 NOTE — Progress Notes (Signed)
Pre visit review using our clinic review tool, if applicable. No additional management support is needed unless otherwise documented below in the visit note. 

## 2015-09-22 DIAGNOSIS — M0600 Rheumatoid arthritis without rheumatoid factor, unspecified site: Secondary | ICD-10-CM | POA: Diagnosis not present

## 2015-09-22 DIAGNOSIS — I5042 Chronic combined systolic (congestive) and diastolic (congestive) heart failure: Secondary | ICD-10-CM | POA: Diagnosis not present

## 2015-09-22 DIAGNOSIS — M6281 Muscle weakness (generalized): Secondary | ICD-10-CM | POA: Diagnosis not present

## 2015-09-22 DIAGNOSIS — I2510 Atherosclerotic heart disease of native coronary artery without angina pectoris: Secondary | ICD-10-CM | POA: Diagnosis not present

## 2015-09-22 DIAGNOSIS — E1065 Type 1 diabetes mellitus with hyperglycemia: Secondary | ICD-10-CM | POA: Diagnosis not present

## 2015-09-22 DIAGNOSIS — E785 Hyperlipidemia, unspecified: Secondary | ICD-10-CM | POA: Diagnosis not present

## 2015-09-22 DIAGNOSIS — S82852D Displaced trimalleolar fracture of left lower leg, subsequent encounter for closed fracture with routine healing: Secondary | ICD-10-CM | POA: Diagnosis not present

## 2015-09-22 DIAGNOSIS — I10 Essential (primary) hypertension: Secondary | ICD-10-CM | POA: Diagnosis not present

## 2015-09-22 DIAGNOSIS — R1314 Dysphagia, pharyngoesophageal phase: Secondary | ICD-10-CM | POA: Diagnosis not present

## 2015-09-23 ENCOUNTER — Encounter: Payer: Self-pay | Admitting: Family

## 2015-09-23 NOTE — Therapy (Signed)
[facility] [address] [place], [place], [address] Phone: [phone]   Fax:  [phone]  Patient Details  Name: Madison [name] MRN: [id number] Date of Birth: Nov 23, 1960 Referring Provider:  No ref. provider found  Encounter Date: 09/23/2015  PHYSICAL THERAPY DISCHARGE SUMMARY  Visits from Start of Care: 1  Current functional level related to goals / functional outcomes:     PT Short Term Goals - 05/06/15 1303    PT SHORT TERM GOAL #1   Title same as LTGs.         PT Long Term Goals - 05/06/15 1304    PT LONG TERM GOAL #1   Title Pt will be independent in HEP to improve strength, endurance, and safety during functional mobility. Target date: 06/03/15.   Status New   PT LONG TERM GOAL #2   Title Pt will improve BERG score to >/=47/56 to decrease falls risk. Target date: 06/03/15.   Status New   PT LONG TERM GOAL #3   Title Pt will ambulate 500' over even/uneven terrain with LRAD at MOD I level to improve functional mobilty. Target date: 06/03/15.   Status New   PT LONG TERM GOAL #4   Title Pt will traverse 12 steps with one handrail at MOD I level in order to traverse stairs at home. Target date: 06/03/15.   Status New   PT LONG TERM GOAL #5   Title Pt will verbalize understanding of CVA risk factors and signs/symptoms to reduce risk of future CVA. Target date: 06/03/15.   Status New   Additional Long Term Goals   Additional Long Term Goals Yes   PT LONG TERM GOAL #6   Title Pt will verbalize plans to join fitness center in order to maintain gains made in PT. Target date: 06/03/15.   Status New        Remaining deficits: Please see eval for deficits, as pt never returned after initial eval.   Education / Equipment: PT duration/frequency/benefits.  Plan: Patient agrees to discharge.  Patient goals were not met. Patient is being discharged due to not returning since the last visit.  ?????        Joanthony Hamza L 09/23/2015, 12:48 PM  [facility] [address] [place], [place], [address] Phone: [phone]   Fax:  [phone]     Geoffry Paradise, PT,DPT 09/23/2015 12:49 PM Phone: [phone] Fax: [phone]

## 2015-09-23 NOTE — Progress Notes (Signed)
Subjective:    Patient ID: Madison [name], female    DOB: 16-Dec-1960, 54 y.o.   MRN: [id number]  HPI 54 year old female is in today with c/o right hand pain x 3 days. Denies injury. Pain with movement. Is in today because she does not know what to take. Pain is 5/10, worse with gripping something and with movement. No swelling or redness.   Review of Systems  Constitutional: Negative.   Respiratory: Negative.   Cardiovascular: Negative.   Gastrointestinal: Negative.   Endocrine: Negative.   Genitourinary: Negative.   Musculoskeletal: Positive for arthralgias.       Right wrist and thumb  Skin: Negative.   Allergic/Immunologic: Negative.   Neurological: Negative.   Psychiatric/Behavioral: Negative.    Past Medical History  Diagnosis Date  . CAD (coronary artery disease)   . Obesity   . Hypercholesteremia   . HTN (hypertension)   . Dyslipidemia   . Aortic stenosis   . Thrombophlebitis   . Heart murmur   . Myocardial infarction (HCC) 12/2014  . Pneumonia 11/2014; 12/2014  . GERD (gastroesophageal reflux disease)   . Stroke syndrome [facility]) 1995; 2001    "when my son was born; problems w/speech and L hand since then" (01/19/2015)  . Anxiety   . Depression   . Allergy   . CHF (congestive heart failure) (HCC)   . Rheumatoid arthritis(714.0)     "hands" (07/01/2015)  . Diabetes (HCC) dx'd 1982    "went straight to insulin"    Social History   Social History  . Marital Status: Divorced    Spouse Name: N/A  . Number of Children: N/A  . Years of Education: N/A   Occupational History  . Not on file.   Social History Main Topics  . Smoking status: Never Smoker   . Smokeless tobacco: Never Used  . Alcohol Use: No  . Drug Use: No  . Sexual Activity: No   Other Topics Concern  . Not on file   Social History Narrative   Divroced and lives at home.    Has two sons who live in [place] at this time. (21&8 y/o).    Has a cat    Walks a lot.       She is having great success  with her diet       Working with exercises that were given to her by PT/OT.     Past Surgical History  Procedure Laterality Date  . Cesarean section  1995  . Foot fracture surgery    . Knee arthroscopy Right   . Neuroplasty / transposition median nerve at carpal tunnel    . Left heart catheterization with coronary angiogram N/A 12/08/2014    Procedure: LEFT HEART CATHETERIZATION WITH CORONARY ANGIOGRAM;  Surgeon: Iran Ouch, MD;  Location: [facility] CATH LAB;  Service: Cardiovascular;  Laterality: N/A;  . Percutaneous coronary stent intervention (pci-s) N/A 12/09/2014    Procedure: PERCUTANEOUS CORONARY STENT INTERVENTION (PCI-S);  Surgeon: [name], MD;  Location: [facility] CATH LAB;  Service: Cardiovascular;  Laterality: N/A;  . Cardiac catheterization  12/08/2014  . Coronary angioplasty with stent placement  12/09/2014  . Aortic valve replacement (avr)/coronary artery bypass grafting (cabg)  "2014"    [name] 03/08/2014  . Fracture surgery    . Tubal ligation  1995  . Cataract extraction Left   . Coronary artery bypass graft    . Orif ankle fracture Left 07/04/2015    Procedure: OPEN REDUCTION INTERNAL  FIXATION (ORIF)  TRIMAL ANKLE FRACTURE;  Surgeon: [name], MD;  Location: [facility] OR;  Service: Orthopedics;  Laterality: Left;    Family History  Problem Relation Age of Onset  . Stroke    . Heart disease Mother   . Heart disease Sister     Allergies  Allergen Reactions  . Other Other (See Comments)    Can burn skin if left on too long.  . Celebrex [Celecoxib] Rash  . Detrol [Tolterodine] Hives    Current Outpatient Prescriptions on File Prior to Visit  Medication Sig Dispense Refill  . acetaminophen (TYLENOL) 325 MG tablet Take 2 tablets (650 mg total) by mouth every 6 (six) hours as needed for mild pain (temp > 101.5).    Marland Kitchen aspirin 81 MG tablet Take 1 tablet (81 mg total) by mouth daily. 30 tablet   . baclofen (LIORESAL) 10 MG tablet TAKE 1 TABLET (10 MG TOTAL) BY MOUTH 2  (TWO) TIMES DAILY. 60 tablet 1  . clopidogrel (PLAVIX) 75 MG tablet TAKE 1 TABLET (75 MG TOTAL) BY MOUTH DAILY WITH BREAKFAST. 30 tablet 5  . enoxaparin (LOVENOX) 80 MG/0.8ML injection Inject 0.7 mLs (70 mg total) into the skin every 12 (twelve) hours. 14 Syringe 0  . escitalopram (LEXAPRO) 20 MG tablet Take 20 mg by mouth daily.    . Insulin NPH, Human,, Isophane, (HUMULIN N) 100 UNIT/ML Kiwkpen Inject 32 Units into the skin every morning. And pen needles 1/day 15 mL 11  . lisinopril (PRINIVIL,ZESTRIL) 2.5 MG tablet Take 1 tablet (2.5 mg total) by mouth daily. 90 tablet 1  . metoprolol tartrate (LOPRESSOR) 25 MG tablet Take 12.5 mg by mouth 2 (two) times daily.    . mupirocin ointment (BACTROBAN) 2 % Place into the nose 2 (two) times daily. Please apply to cheecks 22 g 0  . omeprazole (PRILOSEC) 40 MG capsule TAKE 1 CAPSULE (40 MG TOTAL) BY MOUTH DAILY. 90 capsule 1  . pravastatin (PRAVACHOL) 20 MG tablet Take 1 tablet (20 mg total) by mouth daily. 90 tablet 0  . RHEUMATREX 2.5 MG tablet TAKE 1 TABLET 2 TIMES A WEEK 24 tablet 1  . solifenacin (VESICARE) 5 MG tablet Take 1 tablet (5 mg total) by mouth daily. 90 tablet 2  . traZODone (DESYREL) 50 MG tablet Take 0.5-1 tablets (25-50 mg total) by mouth at bedtime as needed for sleep. 90 tablet 0   No current facility-administered medications on file prior to visit.    BP 120/62 mmHg  Pulse 117  Temp(Src) 98.7 F (37.1 C) (Oral)  Wt 151 lb (68.493 kg)chart was     Objective:   Physical Exam  Constitutional: She is oriented to person, place, and time. She appears well-developed and well-nourished.  Neck: Normal range of motion. Neck supple.  Cardiovascular: Normal rate, regular rhythm and normal heart sounds.   Pulmonary/Chest: Effort normal and breath sounds normal.  Musculoskeletal: She exhibits tenderness. She exhibits no edema.  Right wrist: worse with movement. No swelling.   Neurological: She is alert and oriented to person, place,  and time.  Skin: Skin is warm and dry.  Psychiatric: She has a normal mood and affect.          Assessment & Plan:  Brenlyn was seen today for arm pain.  Diagnoses and all orders for this visit:  Arthritis of hand  Arthritis, wrist   Advised that she strength Tylenol as needed for pain in her thumb and wrist. Recheck as scheduled and sooner  as needed.

## 2015-09-26 DIAGNOSIS — I10 Essential (primary) hypertension: Secondary | ICD-10-CM | POA: Diagnosis not present

## 2015-09-26 DIAGNOSIS — E1065 Type 1 diabetes mellitus with hyperglycemia: Secondary | ICD-10-CM | POA: Diagnosis not present

## 2015-09-26 DIAGNOSIS — M0600 Rheumatoid arthritis without rheumatoid factor, unspecified site: Secondary | ICD-10-CM | POA: Diagnosis not present

## 2015-09-26 DIAGNOSIS — I2510 Atherosclerotic heart disease of native coronary artery without angina pectoris: Secondary | ICD-10-CM | POA: Diagnosis not present

## 2015-09-26 DIAGNOSIS — M6281 Muscle weakness (generalized): Secondary | ICD-10-CM | POA: Diagnosis not present

## 2015-09-26 DIAGNOSIS — E785 Hyperlipidemia, unspecified: Secondary | ICD-10-CM | POA: Diagnosis not present

## 2015-09-26 DIAGNOSIS — I5042 Chronic combined systolic (congestive) and diastolic (congestive) heart failure: Secondary | ICD-10-CM | POA: Diagnosis not present

## 2015-09-26 DIAGNOSIS — S82852D Displaced trimalleolar fracture of left lower leg, subsequent encounter for closed fracture with routine healing: Secondary | ICD-10-CM | POA: Diagnosis not present

## 2015-09-26 DIAGNOSIS — R1314 Dysphagia, pharyngoesophageal phase: Secondary | ICD-10-CM | POA: Diagnosis not present

## 2015-09-27 ENCOUNTER — Ambulatory Visit (INDEPENDENT_AMBULATORY_CARE_PROVIDER_SITE_OTHER): Payer: Commercial Managed Care - HMO | Admitting: General Practice

## 2015-09-27 ENCOUNTER — Telehealth: Payer: Self-pay | Admitting: Adult Health

## 2015-09-27 DIAGNOSIS — E785 Hyperlipidemia, unspecified: Secondary | ICD-10-CM | POA: Diagnosis not present

## 2015-09-27 DIAGNOSIS — M6281 Muscle weakness (generalized): Secondary | ICD-10-CM | POA: Diagnosis not present

## 2015-09-27 DIAGNOSIS — I10 Essential (primary) hypertension: Secondary | ICD-10-CM | POA: Diagnosis not present

## 2015-09-27 DIAGNOSIS — Z954 Presence of other heart-valve replacement: Secondary | ICD-10-CM

## 2015-09-27 DIAGNOSIS — I2510 Atherosclerotic heart disease of native coronary artery without angina pectoris: Secondary | ICD-10-CM | POA: Diagnosis not present

## 2015-09-27 DIAGNOSIS — R1314 Dysphagia, pharyngoesophageal phase: Secondary | ICD-10-CM | POA: Diagnosis not present

## 2015-09-27 DIAGNOSIS — I5042 Chronic combined systolic (congestive) and diastolic (congestive) heart failure: Secondary | ICD-10-CM | POA: Diagnosis not present

## 2015-09-27 DIAGNOSIS — S82852D Displaced trimalleolar fracture of left lower leg, subsequent encounter for closed fracture with routine healing: Secondary | ICD-10-CM | POA: Diagnosis not present

## 2015-09-27 DIAGNOSIS — S82855D Nondisplaced trimalleolar fracture of left lower leg, subsequent encounter for closed fracture with routine healing: Secondary | ICD-10-CM | POA: Diagnosis not present

## 2015-09-27 DIAGNOSIS — Z7901 Long term (current) use of anticoagulants: Secondary | ICD-10-CM

## 2015-09-27 DIAGNOSIS — M0600 Rheumatoid arthritis without rheumatoid factor, unspecified site: Secondary | ICD-10-CM | POA: Diagnosis not present

## 2015-09-27 DIAGNOSIS — E1065 Type 1 diabetes mellitus with hyperglycemia: Secondary | ICD-10-CM | POA: Diagnosis not present

## 2015-09-27 DIAGNOSIS — Z952 Presence of prosthetic heart valve: Secondary | ICD-10-CM

## 2015-09-27 DIAGNOSIS — M069 Rheumatoid arthritis, unspecified: Secondary | ICD-10-CM | POA: Diagnosis not present

## 2015-09-27 LAB — POCT INR: INR: 4.2

## 2015-09-27 NOTE — Progress Notes (Signed)
Pre visit review using our clinic review tool, if applicable. No additional management support is needed unless otherwise documented below in the visit note. 

## 2015-09-27 NOTE — Progress Notes (Signed)
I have reviewed and agree with the plan. 

## 2015-09-27 NOTE — Telephone Encounter (Signed)
Pt's INR  4.2

## 2015-09-28 ENCOUNTER — Ambulatory Visit: Payer: Commercial Managed Care - HMO | Admitting: *Deleted

## 2015-09-28 DIAGNOSIS — E785 Hyperlipidemia, unspecified: Secondary | ICD-10-CM | POA: Diagnosis not present

## 2015-09-28 DIAGNOSIS — R1314 Dysphagia, pharyngoesophageal phase: Secondary | ICD-10-CM | POA: Diagnosis not present

## 2015-09-28 DIAGNOSIS — I5042 Chronic combined systolic (congestive) and diastolic (congestive) heart failure: Secondary | ICD-10-CM | POA: Diagnosis not present

## 2015-09-28 DIAGNOSIS — I2510 Atherosclerotic heart disease of native coronary artery without angina pectoris: Secondary | ICD-10-CM | POA: Diagnosis not present

## 2015-09-28 DIAGNOSIS — M0600 Rheumatoid arthritis without rheumatoid factor, unspecified site: Secondary | ICD-10-CM | POA: Diagnosis not present

## 2015-09-28 DIAGNOSIS — I10 Essential (primary) hypertension: Secondary | ICD-10-CM | POA: Diagnosis not present

## 2015-09-28 DIAGNOSIS — S82852D Displaced trimalleolar fracture of left lower leg, subsequent encounter for closed fracture with routine healing: Secondary | ICD-10-CM | POA: Diagnosis not present

## 2015-09-28 DIAGNOSIS — E1065 Type 1 diabetes mellitus with hyperglycemia: Secondary | ICD-10-CM | POA: Diagnosis not present

## 2015-09-28 DIAGNOSIS — M6281 Muscle weakness (generalized): Secondary | ICD-10-CM | POA: Diagnosis not present

## 2015-09-29 ENCOUNTER — Ambulatory Visit (INDEPENDENT_AMBULATORY_CARE_PROVIDER_SITE_OTHER): Payer: Commercial Managed Care - HMO | Admitting: General Practice

## 2015-09-29 ENCOUNTER — Ambulatory Visit: Payer: Commercial Managed Care - HMO | Admitting: General Practice

## 2015-09-29 ENCOUNTER — Other Ambulatory Visit: Payer: Self-pay | Admitting: *Deleted

## 2015-09-29 DIAGNOSIS — Z954 Presence of other heart-valve replacement: Secondary | ICD-10-CM

## 2015-09-29 DIAGNOSIS — Z7901 Long term (current) use of anticoagulants: Secondary | ICD-10-CM

## 2015-09-29 DIAGNOSIS — Z952 Presence of prosthetic heart valve: Secondary | ICD-10-CM

## 2015-09-29 LAB — POCT INR: INR: 4.2

## 2015-09-29 NOTE — Patient Outreach (Signed)
S:  Referred to see pt for foot care by Ellyn Hack, [facility] Care Manager.        Pt was pleased to have this service provided.   O:  Pt nails are very long and curving over the distal aspect of her toes.       Skin warm and dry. Pulses are 3+ in R foot and 1+ in left (hx fracture).       Monofiliment testing: Pt was able to perceive all 10 touches on each foot.       No open lesions.  A:  Long toenails with potential for skin damage in a diabetic  P:  Trimmed nails.       Left my card with pt in case she needs further help with this.       Reinforced that her foot care is a very important part of her diabetic self management.  Almetta Lovely [facility] Care Manager [phone]

## 2015-09-29 NOTE — Progress Notes (Signed)
Pre visit review using our clinic review tool, if applicable. No additional management support is needed unless otherwise documented below in the visit note. 

## 2015-09-29 NOTE — Progress Notes (Signed)
I agree with this plan.

## 2015-09-30 DIAGNOSIS — S82852D Displaced trimalleolar fracture of left lower leg, subsequent encounter for closed fracture with routine healing: Secondary | ICD-10-CM | POA: Diagnosis not present

## 2015-10-02 DIAGNOSIS — E139 Other specified diabetes mellitus without complications: Secondary | ICD-10-CM | POA: Diagnosis not present

## 2015-10-02 DIAGNOSIS — I10 Essential (primary) hypertension: Secondary | ICD-10-CM | POA: Diagnosis not present

## 2015-10-02 DIAGNOSIS — I6330 Cerebral infarction due to thrombosis of unspecified cerebral artery: Secondary | ICD-10-CM | POA: Diagnosis not present

## 2015-10-03 DIAGNOSIS — I10 Essential (primary) hypertension: Secondary | ICD-10-CM | POA: Diagnosis not present

## 2015-10-03 DIAGNOSIS — E139 Other specified diabetes mellitus without complications: Secondary | ICD-10-CM | POA: Diagnosis not present

## 2015-10-03 DIAGNOSIS — I6330 Cerebral infarction due to thrombosis of unspecified cerebral artery: Secondary | ICD-10-CM | POA: Diagnosis not present

## 2015-10-04 DIAGNOSIS — I6330 Cerebral infarction due to thrombosis of unspecified cerebral artery: Secondary | ICD-10-CM | POA: Diagnosis not present

## 2015-10-04 DIAGNOSIS — E139 Other specified diabetes mellitus without complications: Secondary | ICD-10-CM | POA: Diagnosis not present

## 2015-10-04 DIAGNOSIS — I10 Essential (primary) hypertension: Secondary | ICD-10-CM | POA: Diagnosis not present

## 2015-10-05 ENCOUNTER — Telehealth: Payer: Self-pay | Admitting: General Practice

## 2015-10-05 ENCOUNTER — Telehealth: Payer: Self-pay | Admitting: Adult Health

## 2015-10-05 ENCOUNTER — Ambulatory Visit (INDEPENDENT_AMBULATORY_CARE_PROVIDER_SITE_OTHER): Payer: Commercial Managed Care - HMO | Admitting: General Practice

## 2015-10-05 DIAGNOSIS — M6281 Muscle weakness (generalized): Secondary | ICD-10-CM | POA: Diagnosis not present

## 2015-10-05 DIAGNOSIS — E1065 Type 1 diabetes mellitus with hyperglycemia: Secondary | ICD-10-CM | POA: Diagnosis not present

## 2015-10-05 DIAGNOSIS — E785 Hyperlipidemia, unspecified: Secondary | ICD-10-CM | POA: Diagnosis not present

## 2015-10-05 DIAGNOSIS — I5042 Chronic combined systolic (congestive) and diastolic (congestive) heart failure: Secondary | ICD-10-CM | POA: Diagnosis not present

## 2015-10-05 DIAGNOSIS — I6330 Cerebral infarction due to thrombosis of unspecified cerebral artery: Secondary | ICD-10-CM | POA: Diagnosis not present

## 2015-10-05 DIAGNOSIS — I2510 Atherosclerotic heart disease of native coronary artery without angina pectoris: Secondary | ICD-10-CM | POA: Diagnosis not present

## 2015-10-05 DIAGNOSIS — M0600 Rheumatoid arthritis without rheumatoid factor, unspecified site: Secondary | ICD-10-CM | POA: Diagnosis not present

## 2015-10-05 DIAGNOSIS — I10 Essential (primary) hypertension: Secondary | ICD-10-CM | POA: Diagnosis not present

## 2015-10-05 DIAGNOSIS — E139 Other specified diabetes mellitus without complications: Secondary | ICD-10-CM | POA: Diagnosis not present

## 2015-10-05 DIAGNOSIS — S82852D Displaced trimalleolar fracture of left lower leg, subsequent encounter for closed fracture with routine healing: Secondary | ICD-10-CM | POA: Diagnosis not present

## 2015-10-05 DIAGNOSIS — R1314 Dysphagia, pharyngoesophageal phase: Secondary | ICD-10-CM | POA: Diagnosis not present

## 2015-10-05 LAB — POCT INR: INR: 3.2

## 2015-10-05 NOTE — Progress Notes (Signed)
I have reviewed and agree with the plan. 

## 2015-10-05 NOTE — Telephone Encounter (Signed)
Per [name]'s previous note, Madison [name] is waiting for INR resutls. Will transfer them to her now.

## 2015-10-05 NOTE — Telephone Encounter (Signed)
VO given to Frankford, RN @ @[facility]  to check patient's INR today and to call results to , RN @ [phone].  RN verbalized understanding.

## 2015-10-05 NOTE — Telephone Encounter (Signed)
Madison [name] is calling to report protime is 38.8 and inr 3.2. Please advise. Pt is on coumadin 5 mg

## 2015-10-05 NOTE — Progress Notes (Signed)
Pre visit review using our clinic review tool, if applicable. No additional management support is needed unless otherwise documented below in the visit note. 

## 2015-10-06 ENCOUNTER — Other Ambulatory Visit: Payer: Self-pay

## 2015-10-06 DIAGNOSIS — I10 Essential (primary) hypertension: Secondary | ICD-10-CM | POA: Diagnosis not present

## 2015-10-06 DIAGNOSIS — E139 Other specified diabetes mellitus without complications: Secondary | ICD-10-CM | POA: Diagnosis not present

## 2015-10-06 DIAGNOSIS — I6330 Cerebral infarction due to thrombosis of unspecified cerebral artery: Secondary | ICD-10-CM | POA: Diagnosis not present

## 2015-10-07 ENCOUNTER — Other Ambulatory Visit: Payer: Self-pay

## 2015-10-07 DIAGNOSIS — I10 Essential (primary) hypertension: Secondary | ICD-10-CM | POA: Diagnosis not present

## 2015-10-07 DIAGNOSIS — E139 Other specified diabetes mellitus without complications: Secondary | ICD-10-CM | POA: Diagnosis not present

## 2015-10-07 DIAGNOSIS — I6330 Cerebral infarction due to thrombosis of unspecified cerebral artery: Secondary | ICD-10-CM | POA: Diagnosis not present

## 2015-10-08 DIAGNOSIS — I6330 Cerebral infarction due to thrombosis of unspecified cerebral artery: Secondary | ICD-10-CM | POA: Diagnosis not present

## 2015-10-08 DIAGNOSIS — E139 Other specified diabetes mellitus without complications: Secondary | ICD-10-CM | POA: Diagnosis not present

## 2015-10-08 DIAGNOSIS — I10 Essential (primary) hypertension: Secondary | ICD-10-CM | POA: Diagnosis not present

## 2015-10-09 DIAGNOSIS — I10 Essential (primary) hypertension: Secondary | ICD-10-CM | POA: Diagnosis not present

## 2015-10-09 DIAGNOSIS — I6330 Cerebral infarction due to thrombosis of unspecified cerebral artery: Secondary | ICD-10-CM | POA: Diagnosis not present

## 2015-10-09 DIAGNOSIS — E139 Other specified diabetes mellitus without complications: Secondary | ICD-10-CM | POA: Diagnosis not present

## 2015-10-10 DIAGNOSIS — I6330 Cerebral infarction due to thrombosis of unspecified cerebral artery: Secondary | ICD-10-CM | POA: Diagnosis not present

## 2015-10-10 DIAGNOSIS — I10 Essential (primary) hypertension: Secondary | ICD-10-CM | POA: Diagnosis not present

## 2015-10-10 DIAGNOSIS — E139 Other specified diabetes mellitus without complications: Secondary | ICD-10-CM | POA: Diagnosis not present

## 2015-10-11 DIAGNOSIS — I6330 Cerebral infarction due to thrombosis of unspecified cerebral artery: Secondary | ICD-10-CM | POA: Diagnosis not present

## 2015-10-11 DIAGNOSIS — E139 Other specified diabetes mellitus without complications: Secondary | ICD-10-CM | POA: Diagnosis not present

## 2015-10-11 DIAGNOSIS — I10 Essential (primary) hypertension: Secondary | ICD-10-CM | POA: Diagnosis not present

## 2015-10-12 ENCOUNTER — Ambulatory Visit (INDEPENDENT_AMBULATORY_CARE_PROVIDER_SITE_OTHER): Payer: Commercial Managed Care - HMO | Admitting: General Practice

## 2015-10-12 DIAGNOSIS — D649 Anemia, unspecified: Secondary | ICD-10-CM | POA: Diagnosis not present

## 2015-10-12 DIAGNOSIS — R1314 Dysphagia, pharyngoesophageal phase: Secondary | ICD-10-CM | POA: Diagnosis not present

## 2015-10-12 DIAGNOSIS — I110 Hypertensive heart disease with heart failure: Secondary | ICD-10-CM | POA: Diagnosis not present

## 2015-10-12 DIAGNOSIS — E109 Type 1 diabetes mellitus without complications: Secondary | ICD-10-CM | POA: Diagnosis not present

## 2015-10-12 DIAGNOSIS — I10 Essential (primary) hypertension: Secondary | ICD-10-CM | POA: Diagnosis not present

## 2015-10-12 DIAGNOSIS — I2510 Atherosclerotic heart disease of native coronary artery without angina pectoris: Secondary | ICD-10-CM | POA: Diagnosis not present

## 2015-10-12 DIAGNOSIS — E785 Hyperlipidemia, unspecified: Secondary | ICD-10-CM | POA: Diagnosis not present

## 2015-10-12 DIAGNOSIS — M0600 Rheumatoid arthritis without rheumatoid factor, unspecified site: Secondary | ICD-10-CM | POA: Diagnosis not present

## 2015-10-12 DIAGNOSIS — E139 Other specified diabetes mellitus without complications: Secondary | ICD-10-CM | POA: Diagnosis not present

## 2015-10-12 DIAGNOSIS — I6330 Cerebral infarction due to thrombosis of unspecified cerebral artery: Secondary | ICD-10-CM | POA: Diagnosis not present

## 2015-10-12 DIAGNOSIS — I5042 Chronic combined systolic (congestive) and diastolic (congestive) heart failure: Secondary | ICD-10-CM | POA: Diagnosis not present

## 2015-10-12 DIAGNOSIS — I350 Nonrheumatic aortic (valve) stenosis: Secondary | ICD-10-CM | POA: Diagnosis not present

## 2015-10-12 LAB — POCT INR: INR: 1.9

## 2015-10-12 NOTE — Progress Notes (Signed)
I have reviewed and agree with the plan. 

## 2015-10-12 NOTE — Progress Notes (Signed)
Pre visit review using our clinic review tool, if applicable. No additional management support is needed unless otherwise documented below in the visit note. 

## 2015-10-13 DIAGNOSIS — E139 Other specified diabetes mellitus without complications: Secondary | ICD-10-CM | POA: Diagnosis not present

## 2015-10-13 DIAGNOSIS — I6330 Cerebral infarction due to thrombosis of unspecified cerebral artery: Secondary | ICD-10-CM | POA: Diagnosis not present

## 2015-10-13 DIAGNOSIS — I10 Essential (primary) hypertension: Secondary | ICD-10-CM | POA: Diagnosis not present

## 2015-10-14 DIAGNOSIS — I10 Essential (primary) hypertension: Secondary | ICD-10-CM | POA: Diagnosis not present

## 2015-10-14 DIAGNOSIS — I6330 Cerebral infarction due to thrombosis of unspecified cerebral artery: Secondary | ICD-10-CM | POA: Diagnosis not present

## 2015-10-14 DIAGNOSIS — E139 Other specified diabetes mellitus without complications: Secondary | ICD-10-CM | POA: Diagnosis not present

## 2015-10-15 DIAGNOSIS — I6330 Cerebral infarction due to thrombosis of unspecified cerebral artery: Secondary | ICD-10-CM | POA: Diagnosis not present

## 2015-10-15 DIAGNOSIS — I10 Essential (primary) hypertension: Secondary | ICD-10-CM | POA: Diagnosis not present

## 2015-10-15 DIAGNOSIS — E139 Other specified diabetes mellitus without complications: Secondary | ICD-10-CM | POA: Diagnosis not present

## 2015-10-16 DIAGNOSIS — I6330 Cerebral infarction due to thrombosis of unspecified cerebral artery: Secondary | ICD-10-CM | POA: Diagnosis not present

## 2015-10-16 DIAGNOSIS — I10 Essential (primary) hypertension: Secondary | ICD-10-CM | POA: Diagnosis not present

## 2015-10-16 DIAGNOSIS — E139 Other specified diabetes mellitus without complications: Secondary | ICD-10-CM | POA: Diagnosis not present

## 2015-10-17 DIAGNOSIS — I10 Essential (primary) hypertension: Secondary | ICD-10-CM | POA: Diagnosis not present

## 2015-10-17 DIAGNOSIS — E139 Other specified diabetes mellitus without complications: Secondary | ICD-10-CM | POA: Diagnosis not present

## 2015-10-17 DIAGNOSIS — I6330 Cerebral infarction due to thrombosis of unspecified cerebral artery: Secondary | ICD-10-CM | POA: Diagnosis not present

## 2015-10-18 DIAGNOSIS — E139 Other specified diabetes mellitus without complications: Secondary | ICD-10-CM | POA: Diagnosis not present

## 2015-10-18 DIAGNOSIS — I6330 Cerebral infarction due to thrombosis of unspecified cerebral artery: Secondary | ICD-10-CM | POA: Diagnosis not present

## 2015-10-18 DIAGNOSIS — I10 Essential (primary) hypertension: Secondary | ICD-10-CM | POA: Diagnosis not present

## 2015-10-19 ENCOUNTER — Ambulatory Visit (INDEPENDENT_AMBULATORY_CARE_PROVIDER_SITE_OTHER): Payer: Commercial Managed Care - HMO | Admitting: Adult Health

## 2015-10-19 ENCOUNTER — Encounter: Payer: Self-pay | Admitting: Adult Health

## 2015-10-19 VITALS — BP 120/70 | HR 117 | Temp 98.2°F | Wt 147.7 lb

## 2015-10-19 DIAGNOSIS — I10 Essential (primary) hypertension: Secondary | ICD-10-CM | POA: Diagnosis not present

## 2015-10-19 DIAGNOSIS — H6123 Impacted cerumen, bilateral: Secondary | ICD-10-CM

## 2015-10-19 DIAGNOSIS — I5042 Chronic combined systolic (congestive) and diastolic (congestive) heart failure: Secondary | ICD-10-CM | POA: Diagnosis not present

## 2015-10-19 DIAGNOSIS — M0600 Rheumatoid arthritis without rheumatoid factor, unspecified site: Secondary | ICD-10-CM | POA: Diagnosis not present

## 2015-10-19 DIAGNOSIS — I110 Hypertensive heart disease with heart failure: Secondary | ICD-10-CM | POA: Diagnosis not present

## 2015-10-19 DIAGNOSIS — I350 Nonrheumatic aortic (valve) stenosis: Secondary | ICD-10-CM | POA: Diagnosis not present

## 2015-10-19 DIAGNOSIS — I6330 Cerebral infarction due to thrombosis of unspecified cerebral artery: Secondary | ICD-10-CM | POA: Diagnosis not present

## 2015-10-19 DIAGNOSIS — E139 Other specified diabetes mellitus without complications: Secondary | ICD-10-CM | POA: Diagnosis not present

## 2015-10-19 DIAGNOSIS — E109 Type 1 diabetes mellitus without complications: Secondary | ICD-10-CM | POA: Diagnosis not present

## 2015-10-19 DIAGNOSIS — I2510 Atherosclerotic heart disease of native coronary artery without angina pectoris: Secondary | ICD-10-CM | POA: Diagnosis not present

## 2015-10-19 DIAGNOSIS — R1314 Dysphagia, pharyngoesophageal phase: Secondary | ICD-10-CM | POA: Diagnosis not present

## 2015-10-19 DIAGNOSIS — H6592 Unspecified nonsuppurative otitis media, left ear: Secondary | ICD-10-CM | POA: Diagnosis not present

## 2015-10-19 DIAGNOSIS — D649 Anemia, unspecified: Secondary | ICD-10-CM | POA: Diagnosis not present

## 2015-10-19 DIAGNOSIS — E785 Hyperlipidemia, unspecified: Secondary | ICD-10-CM | POA: Diagnosis not present

## 2015-10-19 LAB — POCT INR: INR: 1.7

## 2015-10-19 MED ORDER — AMOXICILLIN 500 MG PO CAPS: 500.0000 mg | ORAL_CAPSULE | Freq: Two times a day (BID) | ORAL | Status: DC

## 2015-10-19 NOTE — Patient Instructions (Addendum)
It was great seeing you again.   I have sent in a prescription for Amoxicillin, take this twice a day for 10 days.   Follow up with me if you are not feeling any better in the next 2-3 days.   Otitis Media With Effusion Otitis media with effusion is the presence of fluid in the middle ear. This is a common problem in children, which often follows ear infections. It may be present for weeks or longer after the infection. Unlike an acute ear infection, otitis media with effusion refers only to fluid behind the ear drum and not infection. Children with repeated ear and sinus infections and allergy problems are the most likely to get otitis media with effusion. CAUSES  The most frequent cause of the fluid buildup is dysfunction of the eustachian tubes. These are the tubes that drain fluid in the ears to the back of the nose (nasopharynx). SYMPTOMS   The main symptom of this condition is hearing loss. As a result, you or your child may:  Listen to the TV at a loud volume.  Not respond to questions.  Ask "what" often when spoken to.  Mistake or confuse one sound or word for another.  There may be a sensation of fullness or pressure but usually not pain. DIAGNOSIS   Your health care provider will diagnose this condition by examining you or your child's ears.  Your health care provider may test the pressure in you or your child's ear with a tympanometer.  A hearing test may be conducted if the problem persists. TREATMENT   Treatment depends on the duration and the effects of the effusion.  Antibiotics, decongestants, nose drops, and cortisone-type drugs (tablets or nasal spray) may not be helpful.  Children with persistent ear effusions may have delayed language or behavioral problems. Children at risk for developmental delays in hearing, learning, and speech may require referral to a specialist earlier than children not at risk.  You or your child's health care provider may suggest a  referral to an ear, nose, and throat surgeon for treatment. The following may help restore normal hearing:  Drainage of fluid.  Placement of ear tubes (tympanostomy tubes).  Removal of adenoids (adenoidectomy). HOME CARE INSTRUCTIONS   Avoid secondhand smoke.  Infants who are breastfed are less likely to have this condition.  Avoid feeding infants while they are lying flat.  Avoid known environmental allergens.  Avoid people who are sick. SEEK MEDICAL CARE IF:   Hearing is not better in 3 months.  Hearing is worse.  Ear pain.  Drainage from the ear.  Dizziness. MAKE SURE YOU:   Understand these instructions.  Will watch your condition.  Will get help right away if you are not doing well or get worse.   This information is not intended to replace advice given to you by your health care provider. Make sure you discuss any questions you have with your health care provider.   Document Released: 12/13/2004 Document Revised: 11/26/2014 Document Reviewed: 06/02/2013 Elsevier Interactive Patient Education [employer]

## 2015-10-19 NOTE — Progress Notes (Signed)
Subjective:    Patient ID: Madison [name], female    DOB: [birth year], 54 y.o.   MRN: [id number]  HPI  Madison [name] presents to the office today with left ear pain. She's had ear pain on her left side since Sunday. She endorses that the pain has started to move to her right side but continues to be worse on left. Is unsure if she's had any drainage from her ears. He denies any ringing in her ears. She also denies any fevers, headaches, blurred vision, and dizziness.    Review of Systems  Constitutional: Negative.   HENT: Positive for ear discharge (questionable) and ear pain. Negative for facial swelling, hearing loss, postnasal drip, rhinorrhea, sinus pressure, sore throat and trouble swallowing.   Eyes: Negative.   Neurological: Negative.   Hematological: Negative for adenopathy.  All other systems reviewed and are negative.  Past Medical History  Diagnosis Date  . CAD (coronary artery disease)   . Obesity   . Hypercholesteremia   . HTN (hypertension)   . Dyslipidemia   . Aortic stenosis   . Thrombophlebitis   . Heart murmur   . Myocardial infarction (HCC) 12/2014  . Pneumonia 11/2014; 12/2014  . GERD (gastroesophageal reflux disease)   . Stroke syndrome [facility]) 1995; 2001    "when my son was born; problems w/speech and L hand since then" (01/19/2015)  . Anxiety   . Depression   . Allergy   . CHF (congestive heart failure) (HCC)   . Rheumatoid arthritis(714.0)     "hands" (07/01/2015)  . Diabetes (HCC) dx'd 1982    "went straight to insulin"    Social History   Social History  . Marital Status: Divorced    Spouse Name: N/A  . Number of Children: N/A  . Years of Education: N/A   Occupational History  . Not on file.   Social History Main Topics  . Smoking status: Never Smoker   . Smokeless tobacco: Never Used  . Alcohol Use: No  . Drug Use: No  . Sexual Activity: No   Other Topics Concern  . Not on file   Social History Narrative   Divroced and lives at home.    Has  two sons who live in [place] at this time. (21&8 y/o).    Has a cat    Walks a lot.       She is having great success with her diet       Working with exercises that were given to her by PT/OT.     Past Surgical History  Procedure Laterality Date  . Cesarean section  1995  . Foot fracture surgery    . Knee arthroscopy Right   . Neuroplasty / transposition median nerve at carpal tunnel    . Left heart catheterization with coronary angiogram N/A 12/08/2014    Procedure: LEFT HEART CATHETERIZATION WITH CORONARY ANGIOGRAM;  Surgeon: Iran Ouch, MD;  Location: [facility] CATH LAB;  Service: Cardiovascular;  Laterality: N/A;  . Percutaneous coronary stent intervention (pci-s) N/A 12/09/2014    Procedure: PERCUTANEOUS CORONARY STENT INTERVENTION (PCI-S);  Surgeon: [name], MD;  Location: [facility] CATH LAB;  Service: Cardiovascular;  Laterality: N/A;  . Cardiac catheterization  12/08/2014  . Coronary angioplasty with stent placement  12/09/2014  . Aortic valve replacement (avr)/coronary artery bypass grafting (cabg)  "2014"    [name] 03/08/2014  . Fracture surgery    . Tubal ligation  1995  . Cataract extraction Left   .  Coronary artery bypass graft    . Orif ankle fracture Left 07/04/2015    Procedure: OPEN REDUCTION INTERNAL FIXATION (ORIF)  TRIMAL ANKLE FRACTURE;  Surgeon: [name], MD;  Location: [facility] OR;  Service: Orthopedics;  Laterality: Left;    Family History  Problem Relation Age of Onset  . Stroke    . Heart disease Mother   . Heart disease Sister     Allergies  Allergen Reactions  . Other Other (See Comments)    Can burn skin if left on too long.  . Celebrex [Celecoxib] Rash  . Detrol [Tolterodine] Hives    Current Outpatient Prescriptions on File Prior to Visit  Medication Sig Dispense Refill  . acetaminophen (TYLENOL) 325 MG tablet Take 2 tablets (650 mg total) by mouth every 6 (six) hours as needed for mild pain (temp > 101.5).    Marland Kitchen aspirin 81 MG tablet Take 1 tablet  (81 mg total) by mouth daily. 30 tablet   . baclofen (LIORESAL) 10 MG tablet TAKE 1 TABLET (10 MG TOTAL) BY MOUTH 2 (TWO) TIMES DAILY. 60 tablet 1  . clopidogrel (PLAVIX) 75 MG tablet TAKE 1 TABLET (75 MG TOTAL) BY MOUTH DAILY WITH BREAKFAST. 30 tablet 5  . enoxaparin (LOVENOX) 80 MG/0.8ML injection Inject 0.7 mLs (70 mg total) into the skin every 12 (twelve) hours. 14 Syringe 0  . escitalopram (LEXAPRO) 20 MG tablet Take 20 mg by mouth daily.    . Insulin NPH, Human,, Isophane, (HUMULIN N) 100 UNIT/ML Kiwkpen Inject 32 Units into the skin every morning. And pen needles 1/day 15 mL 11  . lisinopril (PRINIVIL,ZESTRIL) 2.5 MG tablet Take 1 tablet (2.5 mg total) by mouth daily. 90 tablet 1  . metoprolol tartrate (LOPRESSOR) 25 MG tablet Take 12.5 mg by mouth 2 (two) times daily.    . mupirocin ointment (BACTROBAN) 2 % Place into the nose 2 (two) times daily. Please apply to cheecks 22 g 0  . omeprazole (PRILOSEC) 40 MG capsule TAKE 1 CAPSULE (40 MG TOTAL) BY MOUTH DAILY. 90 capsule 1  . pravastatin (PRAVACHOL) 20 MG tablet Take 1 tablet (20 mg total) by mouth daily. 90 tablet 0  . RHEUMATREX 2.5 MG tablet TAKE 1 TABLET 2 TIMES A WEEK 24 tablet 1  . solifenacin (VESICARE) 5 MG tablet Take 1 tablet (5 mg total) by mouth daily. 90 tablet 2  . traZODone (DESYREL) 50 MG tablet Take 0.5-1 tablets (25-50 mg total) by mouth at bedtime as needed for sleep. 90 tablet 0   No current facility-administered medications on file prior to visit.    BP 120/70 mmHg  Pulse 117  Temp(Src) 98.2 F (36.8 C) (Oral)  Wt 147 lb 11.2 oz (66.996 kg)       Objective:   Physical Exam  Constitutional: She is oriented to person, place, and time. She appears well-developed and well-nourished. No distress.  HENT:  Head: Normocephalic and atraumatic.  Right Ear: External ear normal.  Left Ear: External ear normal.  Nose: Nose normal.  Mouth/Throat: Oropharynx is clear and moist. No oropharyngeal exudate.  Patient  endorses severe pain when examining internal structure of the ear. She does have bilateral cerumen impactions. left greater than right.  Once cerumen impaction was removed patient with otitis media  with effusion in left ear  Neck: Normal range of motion. Neck supple. No thyromegaly present.  Lymphadenopathy:    She has no cervical adenopathy.  Neurological: She is alert and oriented to person, place, and time.  Skin: Skin is warm and dry. No rash noted. She is not diaphoretic. No erythema. No pallor.  Psychiatric: She has a normal mood and affect. Her behavior is normal. Judgment and thought content normal.  Nursing note and vitals reviewed.      Assessment & Plan:  1. Otitis media with effusion, left - amoxicillin (AMOXIL) 500 MG capsule; Take 1 capsule (500 mg total) by mouth 2 (two) times daily.  Dispense: 20 capsule; Refill: 0 - Follow up in 2-3 days if no improvement or sooner if needed  2. Cerumen impaction, bilateral -Cerumen action was easily removed with normal tap water irrigation. -She endorses feeling slightly better after cerumen was disimpacted

## 2015-10-19 NOTE — Progress Notes (Signed)
Pre visit review using our clinic review tool, if applicable. No additional management support is needed unless otherwise documented below in the visit note. 

## 2015-10-20 ENCOUNTER — Ambulatory Visit (INDEPENDENT_AMBULATORY_CARE_PROVIDER_SITE_OTHER): Payer: Commercial Managed Care - HMO | Admitting: General Practice

## 2015-10-20 DIAGNOSIS — I10 Essential (primary) hypertension: Secondary | ICD-10-CM | POA: Diagnosis not present

## 2015-10-20 DIAGNOSIS — E139 Other specified diabetes mellitus without complications: Secondary | ICD-10-CM | POA: Diagnosis not present

## 2015-10-20 DIAGNOSIS — I6330 Cerebral infarction due to thrombosis of unspecified cerebral artery: Secondary | ICD-10-CM | POA: Diagnosis not present

## 2015-10-20 NOTE — Progress Notes (Signed)
Pre visit review using our clinic review tool, if applicable. No additional management support is needed unless otherwise documented below in the visit note. 

## 2015-10-21 ENCOUNTER — Ambulatory Visit: Payer: Commercial Managed Care - HMO | Admitting: Adult Health

## 2015-10-21 DIAGNOSIS — I6330 Cerebral infarction due to thrombosis of unspecified cerebral artery: Secondary | ICD-10-CM | POA: Diagnosis not present

## 2015-10-21 DIAGNOSIS — E139 Other specified diabetes mellitus without complications: Secondary | ICD-10-CM | POA: Diagnosis not present

## 2015-10-21 DIAGNOSIS — I10 Essential (primary) hypertension: Secondary | ICD-10-CM | POA: Diagnosis not present

## 2015-10-21 NOTE — Progress Notes (Signed)
I am ok with this plan  

## 2015-10-22 DIAGNOSIS — I6330 Cerebral infarction due to thrombosis of unspecified cerebral artery: Secondary | ICD-10-CM | POA: Diagnosis not present

## 2015-10-22 DIAGNOSIS — I10 Essential (primary) hypertension: Secondary | ICD-10-CM | POA: Diagnosis not present

## 2015-10-22 DIAGNOSIS — E139 Other specified diabetes mellitus without complications: Secondary | ICD-10-CM | POA: Diagnosis not present

## 2015-10-23 DIAGNOSIS — I10 Essential (primary) hypertension: Secondary | ICD-10-CM | POA: Diagnosis not present

## 2015-10-23 DIAGNOSIS — I6330 Cerebral infarction due to thrombosis of unspecified cerebral artery: Secondary | ICD-10-CM | POA: Diagnosis not present

## 2015-10-23 DIAGNOSIS — E139 Other specified diabetes mellitus without complications: Secondary | ICD-10-CM | POA: Diagnosis not present

## 2015-10-24 DIAGNOSIS — I6330 Cerebral infarction due to thrombosis of unspecified cerebral artery: Secondary | ICD-10-CM | POA: Diagnosis not present

## 2015-10-24 DIAGNOSIS — I10 Essential (primary) hypertension: Secondary | ICD-10-CM | POA: Diagnosis not present

## 2015-10-24 DIAGNOSIS — E139 Other specified diabetes mellitus without complications: Secondary | ICD-10-CM | POA: Diagnosis not present

## 2015-10-25 DIAGNOSIS — I10 Essential (primary) hypertension: Secondary | ICD-10-CM | POA: Diagnosis not present

## 2015-10-25 DIAGNOSIS — E139 Other specified diabetes mellitus without complications: Secondary | ICD-10-CM | POA: Diagnosis not present

## 2015-10-25 DIAGNOSIS — I6330 Cerebral infarction due to thrombosis of unspecified cerebral artery: Secondary | ICD-10-CM | POA: Diagnosis not present

## 2015-10-26 ENCOUNTER — Telehealth: Payer: Self-pay | Admitting: Endocrinology

## 2015-10-26 ENCOUNTER — Ambulatory Visit (INDEPENDENT_AMBULATORY_CARE_PROVIDER_SITE_OTHER): Payer: Commercial Managed Care - HMO | Admitting: General Practice

## 2015-10-26 DIAGNOSIS — I10 Essential (primary) hypertension: Secondary | ICD-10-CM | POA: Diagnosis not present

## 2015-10-26 DIAGNOSIS — I5042 Chronic combined systolic (congestive) and diastolic (congestive) heart failure: Secondary | ICD-10-CM | POA: Diagnosis not present

## 2015-10-26 DIAGNOSIS — E109 Type 1 diabetes mellitus without complications: Secondary | ICD-10-CM | POA: Diagnosis not present

## 2015-10-26 DIAGNOSIS — I2510 Atherosclerotic heart disease of native coronary artery without angina pectoris: Secondary | ICD-10-CM | POA: Diagnosis not present

## 2015-10-26 DIAGNOSIS — I6330 Cerebral infarction due to thrombosis of unspecified cerebral artery: Secondary | ICD-10-CM | POA: Diagnosis not present

## 2015-10-26 DIAGNOSIS — I350 Nonrheumatic aortic (valve) stenosis: Secondary | ICD-10-CM | POA: Diagnosis not present

## 2015-10-26 DIAGNOSIS — M0600 Rheumatoid arthritis without rheumatoid factor, unspecified site: Secondary | ICD-10-CM | POA: Diagnosis not present

## 2015-10-26 DIAGNOSIS — I110 Hypertensive heart disease with heart failure: Secondary | ICD-10-CM | POA: Diagnosis not present

## 2015-10-26 DIAGNOSIS — E785 Hyperlipidemia, unspecified: Secondary | ICD-10-CM | POA: Diagnosis not present

## 2015-10-26 DIAGNOSIS — R1314 Dysphagia, pharyngoesophageal phase: Secondary | ICD-10-CM | POA: Diagnosis not present

## 2015-10-26 DIAGNOSIS — D649 Anemia, unspecified: Secondary | ICD-10-CM | POA: Diagnosis not present

## 2015-10-26 DIAGNOSIS — E139 Other specified diabetes mellitus without complications: Secondary | ICD-10-CM | POA: Diagnosis not present

## 2015-10-26 LAB — POCT INR: INR: 1.4

## 2015-10-26 NOTE — Progress Notes (Signed)
Pre visit review using our clinic review tool, if applicable. No additional management support is needed unless otherwise documented below in the visit note. 

## 2015-10-26 NOTE — Telephone Encounter (Signed)
I contacted the pt and advised we have a Accu-Check [employer] meter for her to pick up. Meter placed up front.

## 2015-10-26 NOTE — Telephone Encounter (Signed)
Patient meter is not working she changed the battery and it is still not working, could she get another one, please advise

## 2015-10-26 NOTE — Progress Notes (Signed)
I have reviewed and agree with the plan. 

## 2015-10-27 DIAGNOSIS — I10 Essential (primary) hypertension: Secondary | ICD-10-CM | POA: Diagnosis not present

## 2015-10-27 DIAGNOSIS — I6330 Cerebral infarction due to thrombosis of unspecified cerebral artery: Secondary | ICD-10-CM | POA: Diagnosis not present

## 2015-10-27 DIAGNOSIS — E139 Other specified diabetes mellitus without complications: Secondary | ICD-10-CM | POA: Diagnosis not present

## 2015-10-28 DIAGNOSIS — I10 Essential (primary) hypertension: Secondary | ICD-10-CM | POA: Diagnosis not present

## 2015-10-28 DIAGNOSIS — I6330 Cerebral infarction due to thrombosis of unspecified cerebral artery: Secondary | ICD-10-CM | POA: Diagnosis not present

## 2015-10-28 DIAGNOSIS — E139 Other specified diabetes mellitus without complications: Secondary | ICD-10-CM | POA: Diagnosis not present

## 2015-10-29 DIAGNOSIS — I6330 Cerebral infarction due to thrombosis of unspecified cerebral artery: Secondary | ICD-10-CM | POA: Diagnosis not present

## 2015-10-29 DIAGNOSIS — I10 Essential (primary) hypertension: Secondary | ICD-10-CM | POA: Diagnosis not present

## 2015-10-29 DIAGNOSIS — E139 Other specified diabetes mellitus without complications: Secondary | ICD-10-CM | POA: Diagnosis not present

## 2015-10-30 DIAGNOSIS — I10 Essential (primary) hypertension: Secondary | ICD-10-CM | POA: Diagnosis not present

## 2015-10-30 DIAGNOSIS — E139 Other specified diabetes mellitus without complications: Secondary | ICD-10-CM | POA: Diagnosis not present

## 2015-10-30 DIAGNOSIS — I6330 Cerebral infarction due to thrombosis of unspecified cerebral artery: Secondary | ICD-10-CM | POA: Diagnosis not present

## 2015-10-31 ENCOUNTER — Ambulatory Visit: Payer: Commercial Managed Care - HMO | Admitting: Endocrinology

## 2015-10-31 DIAGNOSIS — I10 Essential (primary) hypertension: Secondary | ICD-10-CM | POA: Diagnosis not present

## 2015-10-31 DIAGNOSIS — I6330 Cerebral infarction due to thrombosis of unspecified cerebral artery: Secondary | ICD-10-CM | POA: Diagnosis not present

## 2015-10-31 DIAGNOSIS — E139 Other specified diabetes mellitus without complications: Secondary | ICD-10-CM | POA: Diagnosis not present

## 2015-11-01 DIAGNOSIS — I10 Essential (primary) hypertension: Secondary | ICD-10-CM | POA: Diagnosis not present

## 2015-11-01 DIAGNOSIS — E139 Other specified diabetes mellitus without complications: Secondary | ICD-10-CM | POA: Diagnosis not present

## 2015-11-01 DIAGNOSIS — I6330 Cerebral infarction due to thrombosis of unspecified cerebral artery: Secondary | ICD-10-CM | POA: Diagnosis not present

## 2015-11-02 ENCOUNTER — Ambulatory Visit (INDEPENDENT_AMBULATORY_CARE_PROVIDER_SITE_OTHER): Payer: Commercial Managed Care - HMO | Admitting: General Practice

## 2015-11-02 DIAGNOSIS — E109 Type 1 diabetes mellitus without complications: Secondary | ICD-10-CM | POA: Diagnosis not present

## 2015-11-02 DIAGNOSIS — I110 Hypertensive heart disease with heart failure: Secondary | ICD-10-CM | POA: Diagnosis not present

## 2015-11-02 DIAGNOSIS — R1314 Dysphagia, pharyngoesophageal phase: Secondary | ICD-10-CM | POA: Diagnosis not present

## 2015-11-02 DIAGNOSIS — E785 Hyperlipidemia, unspecified: Secondary | ICD-10-CM | POA: Diagnosis not present

## 2015-11-02 DIAGNOSIS — I10 Essential (primary) hypertension: Secondary | ICD-10-CM | POA: Diagnosis not present

## 2015-11-02 DIAGNOSIS — M0600 Rheumatoid arthritis without rheumatoid factor, unspecified site: Secondary | ICD-10-CM | POA: Diagnosis not present

## 2015-11-02 DIAGNOSIS — I2510 Atherosclerotic heart disease of native coronary artery without angina pectoris: Secondary | ICD-10-CM | POA: Diagnosis not present

## 2015-11-02 DIAGNOSIS — D649 Anemia, unspecified: Secondary | ICD-10-CM | POA: Diagnosis not present

## 2015-11-02 DIAGNOSIS — I350 Nonrheumatic aortic (valve) stenosis: Secondary | ICD-10-CM | POA: Diagnosis not present

## 2015-11-02 DIAGNOSIS — I6330 Cerebral infarction due to thrombosis of unspecified cerebral artery: Secondary | ICD-10-CM | POA: Diagnosis not present

## 2015-11-02 DIAGNOSIS — E139 Other specified diabetes mellitus without complications: Secondary | ICD-10-CM | POA: Diagnosis not present

## 2015-11-02 DIAGNOSIS — I5042 Chronic combined systolic (congestive) and diastolic (congestive) heart failure: Secondary | ICD-10-CM | POA: Diagnosis not present

## 2015-11-02 LAB — POCT INR: INR: 4.3

## 2015-11-02 NOTE — Progress Notes (Signed)
I have reviewed and agree with the plan. 

## 2015-11-02 NOTE — Progress Notes (Signed)
Pre visit review using our clinic review tool, if applicable. No additional management support is needed unless otherwise documented below in the visit note. 

## 2015-11-03 ENCOUNTER — Other Ambulatory Visit: Payer: Self-pay | Admitting: Endocrinology

## 2015-11-03 ENCOUNTER — Telehealth: Payer: Self-pay | Admitting: Adult Health

## 2015-11-03 DIAGNOSIS — E139 Other specified diabetes mellitus without complications: Secondary | ICD-10-CM | POA: Diagnosis not present

## 2015-11-03 DIAGNOSIS — I10 Essential (primary) hypertension: Secondary | ICD-10-CM | POA: Diagnosis not present

## 2015-11-03 DIAGNOSIS — I6330 Cerebral infarction due to thrombosis of unspecified cerebral artery: Secondary | ICD-10-CM | POA: Diagnosis not present

## 2015-11-03 MED ORDER — ESCITALOPRAM OXALATE 20 MG PO TABS: 20.0000 mg | ORAL_TABLET | Freq: Every day | ORAL | Status: DC

## 2015-11-03 NOTE — Telephone Encounter (Signed)
Rx was sent  

## 2015-11-03 NOTE — Telephone Encounter (Signed)
Pt last visit 10/19/15 There is no show when was the last Rx refill   Please advise

## 2015-11-03 NOTE — Telephone Encounter (Signed)
Ok to refill for 6 months 

## 2015-11-03 NOTE — Telephone Encounter (Signed)
Pt request refill of the following: escitalopram (LEXAPRO) 20 MG tablet   Phamacy:  [facility]

## 2015-11-04 DIAGNOSIS — I10 Essential (primary) hypertension: Secondary | ICD-10-CM | POA: Diagnosis not present

## 2015-11-04 DIAGNOSIS — E139 Other specified diabetes mellitus without complications: Secondary | ICD-10-CM | POA: Diagnosis not present

## 2015-11-04 DIAGNOSIS — I6330 Cerebral infarction due to thrombosis of unspecified cerebral artery: Secondary | ICD-10-CM | POA: Diagnosis not present

## 2015-11-04 NOTE — Telephone Encounter (Signed)
Please advise if ok to refill. Rx is not on current med list. Thanks!  

## 2015-11-05 DIAGNOSIS — E139 Other specified diabetes mellitus without complications: Secondary | ICD-10-CM | POA: Diagnosis not present

## 2015-11-05 DIAGNOSIS — I10 Essential (primary) hypertension: Secondary | ICD-10-CM | POA: Diagnosis not present

## 2015-11-05 DIAGNOSIS — I6330 Cerebral infarction due to thrombosis of unspecified cerebral artery: Secondary | ICD-10-CM | POA: Diagnosis not present

## 2015-11-06 DIAGNOSIS — I6330 Cerebral infarction due to thrombosis of unspecified cerebral artery: Secondary | ICD-10-CM | POA: Diagnosis not present

## 2015-11-06 DIAGNOSIS — I10 Essential (primary) hypertension: Secondary | ICD-10-CM | POA: Diagnosis not present

## 2015-11-06 DIAGNOSIS — E139 Other specified diabetes mellitus without complications: Secondary | ICD-10-CM | POA: Diagnosis not present

## 2015-11-07 DIAGNOSIS — E139 Other specified diabetes mellitus without complications: Secondary | ICD-10-CM | POA: Diagnosis not present

## 2015-11-07 DIAGNOSIS — I10 Essential (primary) hypertension: Secondary | ICD-10-CM | POA: Diagnosis not present

## 2015-11-07 DIAGNOSIS — I6330 Cerebral infarction due to thrombosis of unspecified cerebral artery: Secondary | ICD-10-CM | POA: Diagnosis not present

## 2015-11-08 DIAGNOSIS — I6330 Cerebral infarction due to thrombosis of unspecified cerebral artery: Secondary | ICD-10-CM | POA: Diagnosis not present

## 2015-11-08 DIAGNOSIS — E139 Other specified diabetes mellitus without complications: Secondary | ICD-10-CM | POA: Diagnosis not present

## 2015-11-08 DIAGNOSIS — I10 Essential (primary) hypertension: Secondary | ICD-10-CM | POA: Diagnosis not present

## 2015-11-09 ENCOUNTER — Ambulatory Visit (INDEPENDENT_AMBULATORY_CARE_PROVIDER_SITE_OTHER): Payer: Commercial Managed Care - HMO | Admitting: General Practice

## 2015-11-09 DIAGNOSIS — I5042 Chronic combined systolic (congestive) and diastolic (congestive) heart failure: Secondary | ICD-10-CM | POA: Diagnosis not present

## 2015-11-09 DIAGNOSIS — I2510 Atherosclerotic heart disease of native coronary artery without angina pectoris: Secondary | ICD-10-CM | POA: Diagnosis not present

## 2015-11-09 DIAGNOSIS — D649 Anemia, unspecified: Secondary | ICD-10-CM | POA: Diagnosis not present

## 2015-11-09 DIAGNOSIS — I350 Nonrheumatic aortic (valve) stenosis: Secondary | ICD-10-CM | POA: Diagnosis not present

## 2015-11-09 DIAGNOSIS — I110 Hypertensive heart disease with heart failure: Secondary | ICD-10-CM | POA: Diagnosis not present

## 2015-11-09 DIAGNOSIS — R1314 Dysphagia, pharyngoesophageal phase: Secondary | ICD-10-CM | POA: Diagnosis not present

## 2015-11-09 DIAGNOSIS — I6330 Cerebral infarction due to thrombosis of unspecified cerebral artery: Secondary | ICD-10-CM | POA: Diagnosis not present

## 2015-11-09 DIAGNOSIS — M0600 Rheumatoid arthritis without rheumatoid factor, unspecified site: Secondary | ICD-10-CM | POA: Diagnosis not present

## 2015-11-09 DIAGNOSIS — I10 Essential (primary) hypertension: Secondary | ICD-10-CM | POA: Diagnosis not present

## 2015-11-09 DIAGNOSIS — E109 Type 1 diabetes mellitus without complications: Secondary | ICD-10-CM | POA: Diagnosis not present

## 2015-11-09 DIAGNOSIS — E139 Other specified diabetes mellitus without complications: Secondary | ICD-10-CM | POA: Diagnosis not present

## 2015-11-09 DIAGNOSIS — E785 Hyperlipidemia, unspecified: Secondary | ICD-10-CM | POA: Diagnosis not present

## 2015-11-09 LAB — POCT INR: INR: 2

## 2015-11-09 NOTE — Progress Notes (Signed)
I have reviewed and agree with the plan. 

## 2015-11-09 NOTE — Progress Notes (Signed)
Pre visit review using our clinic review tool, if applicable. No additional management support is needed unless otherwise documented below in the visit note. 

## 2015-11-10 DIAGNOSIS — I10 Essential (primary) hypertension: Secondary | ICD-10-CM | POA: Diagnosis not present

## 2015-11-10 DIAGNOSIS — I6330 Cerebral infarction due to thrombosis of unspecified cerebral artery: Secondary | ICD-10-CM | POA: Diagnosis not present

## 2015-11-10 DIAGNOSIS — E139 Other specified diabetes mellitus without complications: Secondary | ICD-10-CM | POA: Diagnosis not present

## 2015-11-11 DIAGNOSIS — I10 Essential (primary) hypertension: Secondary | ICD-10-CM | POA: Diagnosis not present

## 2015-11-11 DIAGNOSIS — E139 Other specified diabetes mellitus without complications: Secondary | ICD-10-CM | POA: Diagnosis not present

## 2015-11-11 DIAGNOSIS — I6330 Cerebral infarction due to thrombosis of unspecified cerebral artery: Secondary | ICD-10-CM | POA: Diagnosis not present

## 2015-11-12 DIAGNOSIS — I10 Essential (primary) hypertension: Secondary | ICD-10-CM | POA: Diagnosis not present

## 2015-11-12 DIAGNOSIS — E139 Other specified diabetes mellitus without complications: Secondary | ICD-10-CM | POA: Diagnosis not present

## 2015-11-12 DIAGNOSIS — I6330 Cerebral infarction due to thrombosis of unspecified cerebral artery: Secondary | ICD-10-CM | POA: Diagnosis not present

## 2015-11-13 DIAGNOSIS — E139 Other specified diabetes mellitus without complications: Secondary | ICD-10-CM | POA: Diagnosis not present

## 2015-11-13 DIAGNOSIS — I6330 Cerebral infarction due to thrombosis of unspecified cerebral artery: Secondary | ICD-10-CM | POA: Diagnosis not present

## 2015-11-13 DIAGNOSIS — I10 Essential (primary) hypertension: Secondary | ICD-10-CM | POA: Diagnosis not present

## 2015-11-14 ENCOUNTER — Other Ambulatory Visit: Payer: Self-pay | Admitting: Family

## 2015-11-17 DIAGNOSIS — E139 Other specified diabetes mellitus without complications: Secondary | ICD-10-CM | POA: Diagnosis not present

## 2015-11-17 DIAGNOSIS — I6330 Cerebral infarction due to thrombosis of unspecified cerebral artery: Secondary | ICD-10-CM | POA: Diagnosis not present

## 2015-11-17 DIAGNOSIS — I10 Essential (primary) hypertension: Secondary | ICD-10-CM | POA: Diagnosis not present

## 2015-11-18 ENCOUNTER — Ambulatory Visit (INDEPENDENT_AMBULATORY_CARE_PROVIDER_SITE_OTHER): Payer: Commercial Managed Care - HMO | Admitting: General Practice

## 2015-11-18 DIAGNOSIS — I110 Hypertensive heart disease with heart failure: Secondary | ICD-10-CM | POA: Diagnosis not present

## 2015-11-18 DIAGNOSIS — E785 Hyperlipidemia, unspecified: Secondary | ICD-10-CM | POA: Diagnosis not present

## 2015-11-18 DIAGNOSIS — M0600 Rheumatoid arthritis without rheumatoid factor, unspecified site: Secondary | ICD-10-CM | POA: Diagnosis not present

## 2015-11-18 DIAGNOSIS — R1314 Dysphagia, pharyngoesophageal phase: Secondary | ICD-10-CM | POA: Diagnosis not present

## 2015-11-18 DIAGNOSIS — E109 Type 1 diabetes mellitus without complications: Secondary | ICD-10-CM | POA: Diagnosis not present

## 2015-11-18 DIAGNOSIS — E139 Other specified diabetes mellitus without complications: Secondary | ICD-10-CM | POA: Diagnosis not present

## 2015-11-18 DIAGNOSIS — I6330 Cerebral infarction due to thrombosis of unspecified cerebral artery: Secondary | ICD-10-CM | POA: Diagnosis not present

## 2015-11-18 DIAGNOSIS — I2510 Atherosclerotic heart disease of native coronary artery without angina pectoris: Secondary | ICD-10-CM | POA: Diagnosis not present

## 2015-11-18 DIAGNOSIS — D649 Anemia, unspecified: Secondary | ICD-10-CM | POA: Diagnosis not present

## 2015-11-18 DIAGNOSIS — I350 Nonrheumatic aortic (valve) stenosis: Secondary | ICD-10-CM | POA: Diagnosis not present

## 2015-11-18 DIAGNOSIS — I5042 Chronic combined systolic (congestive) and diastolic (congestive) heart failure: Secondary | ICD-10-CM | POA: Diagnosis not present

## 2015-11-18 DIAGNOSIS — I10 Essential (primary) hypertension: Secondary | ICD-10-CM | POA: Diagnosis not present

## 2015-11-18 LAB — POCT INR: INR: 1.4

## 2015-11-18 NOTE — Progress Notes (Signed)
I have reviewed and agree with the plan. 

## 2015-11-18 NOTE — Progress Notes (Signed)
Pre visit review using our clinic review tool, if applicable. No additional management support is needed unless otherwise documented below in the visit note. 

## 2015-11-19 DIAGNOSIS — E139 Other specified diabetes mellitus without complications: Secondary | ICD-10-CM | POA: Diagnosis not present

## 2015-11-19 DIAGNOSIS — I10 Essential (primary) hypertension: Secondary | ICD-10-CM | POA: Diagnosis not present

## 2015-11-19 DIAGNOSIS — I6330 Cerebral infarction due to thrombosis of unspecified cerebral artery: Secondary | ICD-10-CM | POA: Diagnosis not present

## 2015-11-20 DIAGNOSIS — I6330 Cerebral infarction due to thrombosis of unspecified cerebral artery: Secondary | ICD-10-CM | POA: Diagnosis not present

## 2015-11-20 DIAGNOSIS — E139 Other specified diabetes mellitus without complications: Secondary | ICD-10-CM | POA: Diagnosis not present

## 2015-11-20 DIAGNOSIS — I10 Essential (primary) hypertension: Secondary | ICD-10-CM | POA: Diagnosis not present

## 2015-11-21 DIAGNOSIS — I6330 Cerebral infarction due to thrombosis of unspecified cerebral artery: Secondary | ICD-10-CM | POA: Diagnosis not present

## 2015-11-21 DIAGNOSIS — I10 Essential (primary) hypertension: Secondary | ICD-10-CM | POA: Diagnosis not present

## 2015-11-21 DIAGNOSIS — E139 Other specified diabetes mellitus without complications: Secondary | ICD-10-CM | POA: Diagnosis not present

## 2015-11-22 ENCOUNTER — Inpatient Hospital Stay (HOSPITAL_COMMUNITY)
Admission: EM | Admit: 2015-11-22 | Discharge: 2015-11-25 | DRG: 638 | Disposition: A | Discharge status: Home Health | Payer: Commercial Managed Care - HMO | Attending: Internal Medicine | Admitting: Internal Medicine

## 2015-11-22 ENCOUNTER — Encounter (HOSPITAL_COMMUNITY): Payer: Self-pay | Admitting: Emergency Medicine

## 2015-11-22 DIAGNOSIS — Z79899 Other long term (current) drug therapy: Secondary | ICD-10-CM | POA: Diagnosis not present

## 2015-11-22 DIAGNOSIS — E7800 Pure hypercholesterolemia, unspecified: Secondary | ICD-10-CM | POA: Diagnosis present

## 2015-11-22 DIAGNOSIS — E1010 Type 1 diabetes mellitus with ketoacidosis without coma: Secondary | ICD-10-CM | POA: Diagnosis not present

## 2015-11-22 DIAGNOSIS — I10 Essential (primary) hypertension: Secondary | ICD-10-CM | POA: Diagnosis not present

## 2015-11-22 DIAGNOSIS — Z8673 Personal history of transient ischemic attack (TIA), and cerebral infarction without residual deficits: Secondary | ICD-10-CM | POA: Diagnosis not present

## 2015-11-22 DIAGNOSIS — R1111 Vomiting without nausea: Secondary | ICD-10-CM | POA: Diagnosis not present

## 2015-11-22 DIAGNOSIS — E785 Hyperlipidemia, unspecified: Secondary | ICD-10-CM | POA: Diagnosis present

## 2015-11-22 DIAGNOSIS — E1110 Type 2 diabetes mellitus with ketoacidosis without coma: Secondary | ICD-10-CM | POA: Diagnosis present

## 2015-11-22 DIAGNOSIS — D72828 Other elevated white blood cell count: Secondary | ICD-10-CM | POA: Diagnosis present

## 2015-11-22 DIAGNOSIS — N179 Acute kidney failure, unspecified: Secondary | ICD-10-CM | POA: Diagnosis not present

## 2015-11-22 DIAGNOSIS — Z7902 Long term (current) use of antithrombotics/antiplatelets: Secondary | ICD-10-CM

## 2015-11-22 DIAGNOSIS — I5042 Chronic combined systolic (congestive) and diastolic (congestive) heart failure: Secondary | ICD-10-CM | POA: Diagnosis not present

## 2015-11-22 DIAGNOSIS — Z7901 Long term (current) use of anticoagulants: Secondary | ICD-10-CM | POA: Diagnosis not present

## 2015-11-22 DIAGNOSIS — D649 Anemia, unspecified: Secondary | ICD-10-CM | POA: Diagnosis present

## 2015-11-22 DIAGNOSIS — Z952 Presence of prosthetic heart valve: Secondary | ICD-10-CM

## 2015-11-22 DIAGNOSIS — E875 Hyperkalemia: Secondary | ICD-10-CM | POA: Diagnosis present

## 2015-11-22 DIAGNOSIS — E871 Hypo-osmolality and hyponatremia: Secondary | ICD-10-CM | POA: Diagnosis not present

## 2015-11-22 DIAGNOSIS — E10649 Type 1 diabetes mellitus with hypoglycemia without coma: Secondary | ICD-10-CM | POA: Diagnosis not present

## 2015-11-22 DIAGNOSIS — E1042 Type 1 diabetes mellitus with diabetic polyneuropathy: Secondary | ICD-10-CM | POA: Diagnosis present

## 2015-11-22 DIAGNOSIS — R1110 Vomiting, unspecified: Secondary | ICD-10-CM

## 2015-11-22 DIAGNOSIS — D72829 Elevated white blood cell count, unspecified: Secondary | ICD-10-CM

## 2015-11-22 DIAGNOSIS — E860 Dehydration: Secondary | ICD-10-CM | POA: Diagnosis present

## 2015-11-22 DIAGNOSIS — M069 Rheumatoid arthritis, unspecified: Secondary | ICD-10-CM | POA: Diagnosis present

## 2015-11-22 DIAGNOSIS — I2510 Atherosclerotic heart disease of native coronary artery without angina pectoris: Secondary | ICD-10-CM | POA: Diagnosis present

## 2015-11-22 DIAGNOSIS — K5900 Constipation, unspecified: Secondary | ICD-10-CM | POA: Diagnosis not present

## 2015-11-22 DIAGNOSIS — F329 Major depressive disorder, single episode, unspecified: Secondary | ICD-10-CM | POA: Diagnosis present

## 2015-11-22 DIAGNOSIS — Z7982 Long term (current) use of aspirin: Secondary | ICD-10-CM

## 2015-11-22 DIAGNOSIS — Z955 Presence of coronary angioplasty implant and graft: Secondary | ICD-10-CM | POA: Diagnosis not present

## 2015-11-22 DIAGNOSIS — I252 Old myocardial infarction: Secondary | ICD-10-CM | POA: Diagnosis not present

## 2015-11-22 DIAGNOSIS — E10319 Type 1 diabetes mellitus with unspecified diabetic retinopathy without macular edema: Secondary | ICD-10-CM | POA: Diagnosis present

## 2015-11-22 DIAGNOSIS — Z794 Long term (current) use of insulin: Secondary | ICD-10-CM

## 2015-11-22 DIAGNOSIS — Z951 Presence of aortocoronary bypass graft: Secondary | ICD-10-CM

## 2015-11-22 DIAGNOSIS — I6330 Cerebral infarction due to thrombosis of unspecified cerebral artery: Secondary | ICD-10-CM | POA: Diagnosis not present

## 2015-11-22 DIAGNOSIS — K219 Gastro-esophageal reflux disease without esophagitis: Secondary | ICD-10-CM | POA: Diagnosis present

## 2015-11-22 DIAGNOSIS — R739 Hyperglycemia, unspecified: Secondary | ICD-10-CM | POA: Diagnosis not present

## 2015-11-22 DIAGNOSIS — E139 Other specified diabetes mellitus without complications: Secondary | ICD-10-CM | POA: Diagnosis not present

## 2015-11-22 LAB — CBG MONITORING, ED

## 2015-11-22 MED ORDER — SODIUM CHLORIDE 0.9 % IV BOLUS (SEPSIS)
2000.0000 mL | Freq: Once | INTRAVENOUS | Status: AC
  Administered 2015-11-23: 2000 mL via INTRAVENOUS

## 2015-11-22 MED ORDER — DEXTROSE-NACL 5-0.45 % IV SOLN
INTRAVENOUS | Status: DC
  Administered 2015-11-23: 08:00:00 via INTRAVENOUS

## 2015-11-22 MED ORDER — SODIUM CHLORIDE 0.9 % IV SOLN
INTRAVENOUS | Status: DC
  Administered 2015-11-23: 5.4 [IU]/h via INTRAVENOUS
  Administered 2015-11-23: 4.2 [IU]/h via INTRAVENOUS
  Filled 2015-11-22: qty 2.5

## 2015-11-22 MED ORDER — SODIUM CHLORIDE 0.9 % IV SOLN
1000.0000 mL | INTRAVENOUS | Status: DC
  Administered 2015-11-23: 1000 mL via INTRAVENOUS

## 2015-11-22 MED ORDER — ONDANSETRON HCL 4 MG/2ML IJ SOLN
4.0000 mg | Freq: Once | INTRAMUSCULAR | Status: AC
  Administered 2015-11-23: 4 mg via INTRAVENOUS
  Filled 2015-11-22: qty 2

## 2015-11-22 NOTE — ED Notes (Addendum)
Pt with [facility] with increased blood sugars. Pt reports that she always gets sick when her sugars are elevated. Checked it at home was 500 with EMS 596. 500 ML bag NS started. Vitals stable pt is nauseated. Uses Insulin Humalin and Lantus for correction.

## 2015-11-22 NOTE — ED Notes (Signed)
IV attempted due to other IV being positional. No success. IV team consulted,.

## 2015-11-22 NOTE — ED Provider Notes (Signed)
CSN: [id number]     Arrival date & time 11/22/15  2301 History  By signing my name below, I, Bethel Born, attest that this documentation has been prepared under the direction and in the presence of Rolland Porter, MD. Electronically Signed: Bethel Born, ED Scribe. 11/22/2015. 11:43 PM   Chief Complaint  Patient presents with  . Hyperglycemia    The history is provided by the patient. No language interpreter was used.   Brought in by EMS, ANNIELEE JEMMOTT is a 55 y.o. female with history of diabetes and aortic valve replacement on Coumadin who presents to the Emergency Department complaining of nausea and vomiting with onset at approximately 5 PM today. Pt associates her symptoms with hyperglycemia. She typically feels ill when her blood sugar is elevated but does not often vomit. This morning her blood sugar was 237 but was 596 by EMS. Pt denies abdominal pain, difficulty concentrating, confusion, chest pain, SOB, and diarrhea.   Past Medical History  Diagnosis Date  . CAD (coronary artery disease)   . Obesity   . Hypercholesteremia   . HTN (hypertension)   . Dyslipidemia   . Aortic stenosis   . Thrombophlebitis   . Heart murmur   . Myocardial infarction (HCC) 12/2014  . Pneumonia 11/2014; 12/2014  . GERD (gastroesophageal reflux disease)   . Stroke syndrome [facility]) 1995; 2001    "when my son was born; problems w/speech and L hand since then" (01/19/2015)  . Anxiety   . Depression   . Allergy   . CHF (congestive heart failure) (HCC)   . Rheumatoid arthritis(714.0)     "hands" (07/01/2015)  . Diabetes (HCC) dx'd 1982    "went straight to insulin"   Past Surgical History  Procedure Laterality Date  . Cesarean section  1995  . Foot fracture surgery    . Knee arthroscopy Right   . Neuroplasty / transposition median nerve at carpal tunnel    . Left heart catheterization with coronary angiogram N/A 12/08/2014    Procedure: LEFT HEART CATHETERIZATION WITH CORONARY ANGIOGRAM;  Surgeon:  Iran Ouch, MD;  Location: [facility] CATH LAB;  Service: Cardiovascular;  Laterality: N/A;  . Percutaneous coronary stent intervention (pci-s) N/A 12/09/2014    Procedure: PERCUTANEOUS CORONARY STENT INTERVENTION (PCI-S);  Surgeon: [name], MD;  Location: [facility] CATH LAB;  Service: Cardiovascular;  Laterality: N/A;  . Cardiac catheterization  12/08/2014  . Coronary angioplasty with stent placement  12/09/2014  . Aortic valve replacement (avr)/coronary artery bypass grafting (cabg)  "2014"    [name] 03/08/2014  . Fracture surgery    . Tubal ligation  1995  . Cataract extraction Left   . Coronary artery bypass graft    . Orif ankle fracture Left 07/04/2015    Procedure: OPEN REDUCTION INTERNAL FIXATION (ORIF)  TRIMAL ANKLE FRACTURE;  Surgeon: [name], MD;  Location: [facility] OR;  Service: Orthopedics;  Laterality: Left;   Family History  Problem Relation Age of Onset  . Stroke    . Heart disease Mother   . Heart disease Sister    Social History  Substance Use Topics  . Smoking status: Never Smoker   . Smokeless tobacco: Never Used  . Alcohol Use: No   OB History    No data available     Review of Systems  Constitutional: Negative for fever, chills, diaphoresis, appetite change and fatigue.  HENT: Negative for mouth sores, sore throat and trouble swallowing.   Eyes: Negative for visual disturbance.  Respiratory: Negative for cough, chest tightness, shortness of breath and wheezing.   Cardiovascular: Negative for chest pain.  Gastrointestinal: Positive for nausea and vomiting. Negative for abdominal pain, diarrhea and abdominal distention.  Endocrine: Negative for polydipsia, polyphagia and polyuria.  Genitourinary: Negative for dysuria, frequency and hematuria.  Musculoskeletal: Negative for gait problem.  Skin: Negative for color change, pallor and rash.  Neurological: Negative for dizziness, syncope, light-headedness and headaches.  Hematological: Does not bruise/bleed easily.   Psychiatric/Behavioral: Negative for behavioral problems and confusion.      Allergies  Other; Celebrex; and Detrol  Home Medications   Prior to Admission medications   Medication Sig Start Date End Date Taking? Authorizing Provider  acetaminophen (TYLENOL) 325 MG tablet Take 2 tablets (650 mg total) by mouth every 6 (six) hours as needed for mild pain (temp > 101.5). 08/09/15   Leana Roe Elgergawy, MD  amoxicillin (AMOXIL) 500 MG capsule Take 1 capsule (500 mg total) by mouth 2 (two) times daily. 10/19/15   [name], NP  aspirin 81 MG tablet Take 1 tablet (81 mg total) by mouth daily. 10/11/14   Leana Roe Elgergawy, MD  baclofen (LIORESAL) 10 MG tablet TAKE 1 TABLET (10 MG TOTAL) BY MOUTH 2 (TWO) TIMES DAILY. 06/29/15   [name], NP  clopidogrel (PLAVIX) 75 MG tablet TAKE 1 TABLET (75 MG TOTAL) BY MOUTH DAILY WITH BREAKFAST. 09/14/15   Wendall Stade, MD  enoxaparin (LOVENOX) 80 MG/0.8ML injection Inject 0.7 mLs (70 mg total) into the skin every 12 (twelve) hours. 08/09/15   Leana Roe Elgergawy, MD  escitalopram (LEXAPRO) 20 MG tablet Take 1 tablet (20 mg total) by mouth daily. 11/03/15   [name], NP  Insulin NPH, Human,, Isophane, (HUMULIN N) 100 UNIT/ML Kiwkpen Inject 32 Units into the skin every morning. And pen needles 1/day 08/31/15   Romero Belling, MD  LANTUS SOLOSTAR 100 UNIT/ML Solostar Pen INJECT 50 UNITS AT 10 PM 11/04/15   Romero Belling, MD  lisinopril (PRINIVIL,ZESTRIL) 2.5 MG tablet Take 1 tablet (2.5 mg total) by mouth daily. 09/12/15   [name], NP  metoprolol tartrate (LOPRESSOR) 25 MG tablet Take 12.5 mg by mouth 2 (two) times daily.    Historical Provider, MD  mupirocin ointment (BACTROBAN) 2 % Place into the nose 2 (two) times daily. Please apply to cheecks 08/09/15   Leana Roe Elgergawy, MD  omeprazole (PRILOSEC) 40 MG capsule TAKE 1 CAPSULE (40 MG TOTAL) BY MOUTH DAILY. 09/12/15   [name], NP  pravastatin (PRAVACHOL) 20 MG tablet Take 1 tablet (20 mg  total) by mouth daily. 08/17/15   [name], NP  pravastatin (PRAVACHOL) 20 MG tablet TAKE 1 TABLET BY MOUTH EVERY DAY 11/15/15   [name], NP  RHEUMATREX 2.5 MG tablet TAKE 1 TABLET 2 TIMES A WEEK 05/24/15   [name], NP  solifenacin (VESICARE) 5 MG tablet Take 1 tablet (5 mg total) by mouth daily. 05/03/15   [name], NP  traZODone (DESYREL) 50 MG tablet Take 0.5-1 tablets (25-50 mg total) by mouth at bedtime as needed for sleep. 09/13/15   [name], NP   BP 145/60 mmHg  Pulse 95  Temp(Src) 98.8 F (37.1 C) (Oral)  Resp 18  Ht 5\' 3"  (1.6 m)  Wt 147 lb (66.679 kg)  BMI 26.05 kg/m2  SpO2 100% Physical Exam  Constitutional: She is oriented to person, place, and time. She appears well-developed and well-nourished. No distress.  HENT:  Head: Normocephalic.  Mouth/Throat: Mucous membranes are  dry.  Eyes: Conjunctivae are normal. Pupils are equal, round, and reactive to light. No scleral icterus.  Neck: Normal range of motion. Neck supple. No thyromegaly present.  Cardiovascular: Normal rate and regular rhythm.  Exam reveals no gallop and no friction rub.   Murmur heard.  Systolic murmur is present with a grade of 2/6  Pulmonary/Chest: Effort normal and breath sounds normal. No respiratory distress. She has no wheezes. She has no rales.  Abdominal: Soft. Bowel sounds are normal. She exhibits no distension. There is no tenderness. There is no rebound.  Musculoskeletal: Normal range of motion.  Neurological: She is alert and oriented to person, place, and time.  Skin: Skin is warm and dry. No rash noted. No pallor.  Psychiatric: She has a normal mood and affect. Her behavior is normal.    ED Course  Procedures (including critical care time) DIAGNOSTIC STUDIES: Oxygen Saturation is 100% on RA,  normal by my interpretation.    COORDINATION OF CARE: 11:39 PM Discussed treatment plan which includes lab work, Zofran, and IVF with pt at bedside and pt agreed to  plan.  Labs Review Labs Reviewed  BASIC METABOLIC PANEL - Abnormal; Notable for the following:    Sodium 130 (*)    Potassium 5.5 (*)    Chloride 91 (*)    CO2 16 (*)    Glucose, Bld 766 (*)    BUN 25 (*)    Creatinine, Ser 1.27 (*)    GFR calc non Af Amer 47 (*)    GFR calc Af Amer 54 (*)    Anion gap 23 (*)    All other components within normal limits  CBC - Abnormal; Notable for the following:    WBC 17.8 (*)    Hemoglobin 10.8 (*)    HCT 33.9 (*)    MCH 25.1 (*)    All other components within normal limits  CBG MONITORING, ED - Abnormal; Notable for the following:    Glucose-Capillary >600 (*)    All other components within normal limits  URINALYSIS, ROUTINE W REFLEX MICROSCOPIC (NOT AT [facility])  TROPONIN I    Imaging Review No results found. I have personally reviewed and evaluated these images and lab results as part of my medical decision-making.   EKG Interpretation None      MDM   Final diagnoses:  Diabetic ketoacidosis without coma associated with type 1 diabetes mellitus (HCC)    Admission had initial improvement in her nausea. It has recurred after some movement in the room. Labs show DKA. Anion gap 23. Pseudohyponatremia at 1:30. Glucose 176. CO2 16. Leukocytosis. Afebrile. Not hypoxemic. No chest pain. Started on insulin infusion of glucose stabilizer here. Given 2 L fluids and continued on maintenance. I will speak with the hospitalist regarding disposition.  CRITICAL CARE Performed by: Rolland Porter JOSEPH   Total critical care time: 60 minutes  Critical care time was exclusive of separately billable procedures and treating other patients.  Critical care was necessary to treat or prevent imminent or life-threatening deterioration.  Critical care was time spent personally by me on the following activities: development of treatment plan with patient and/or surrogate as well as nursing, discussions with consultants, evaluation of patient's response to  treatment, examination of patient, obtaining history from patient or surrogate, ordering and performing treatments and interventions, ordering and review of laboratory studies, ordering and review of radiographic studies, pulse oximetry and re-evaluation of patient's condition. Care   I personally performed the services described in  this documentation, which was scribed in my presence. The recorded information has been reviewed and is accurate.    Rolland Porter, MD 11/23/15 [phone]

## 2015-11-22 NOTE — ED Notes (Signed)
Bed: WA02 Expected date:  Expected time:  Means of arrival:  Comments: hyperglycemia 

## 2015-11-23 ENCOUNTER — Inpatient Hospital Stay (HOSPITAL_COMMUNITY): Payer: Commercial Managed Care - HMO

## 2015-11-23 DIAGNOSIS — K5900 Constipation, unspecified: Secondary | ICD-10-CM

## 2015-11-23 DIAGNOSIS — I10 Essential (primary) hypertension: Secondary | ICD-10-CM

## 2015-11-23 DIAGNOSIS — I5042 Chronic combined systolic (congestive) and diastolic (congestive) heart failure: Secondary | ICD-10-CM | POA: Diagnosis present

## 2015-11-23 DIAGNOSIS — Z7901 Long term (current) use of anticoagulants: Secondary | ICD-10-CM | POA: Diagnosis not present

## 2015-11-23 DIAGNOSIS — Z8673 Personal history of transient ischemic attack (TIA), and cerebral infarction without residual deficits: Secondary | ICD-10-CM | POA: Diagnosis not present

## 2015-11-23 DIAGNOSIS — Z794 Long term (current) use of insulin: Secondary | ICD-10-CM | POA: Diagnosis not present

## 2015-11-23 DIAGNOSIS — F329 Major depressive disorder, single episode, unspecified: Secondary | ICD-10-CM | POA: Diagnosis present

## 2015-11-23 DIAGNOSIS — E7800 Pure hypercholesterolemia, unspecified: Secondary | ICD-10-CM | POA: Diagnosis present

## 2015-11-23 DIAGNOSIS — Z955 Presence of coronary angioplasty implant and graft: Secondary | ICD-10-CM | POA: Diagnosis not present

## 2015-11-23 DIAGNOSIS — M069 Rheumatoid arthritis, unspecified: Secondary | ICD-10-CM | POA: Diagnosis present

## 2015-11-23 DIAGNOSIS — I2510 Atherosclerotic heart disease of native coronary artery without angina pectoris: Secondary | ICD-10-CM | POA: Diagnosis present

## 2015-11-23 DIAGNOSIS — D649 Anemia, unspecified: Secondary | ICD-10-CM | POA: Diagnosis present

## 2015-11-23 DIAGNOSIS — Z951 Presence of aortocoronary bypass graft: Secondary | ICD-10-CM | POA: Diagnosis not present

## 2015-11-23 DIAGNOSIS — Z954 Presence of other heart-valve replacement: Secondary | ICD-10-CM

## 2015-11-23 DIAGNOSIS — Z7982 Long term (current) use of aspirin: Secondary | ICD-10-CM | POA: Diagnosis not present

## 2015-11-23 DIAGNOSIS — E875 Hyperkalemia: Secondary | ICD-10-CM

## 2015-11-23 DIAGNOSIS — E1042 Type 1 diabetes mellitus with diabetic polyneuropathy: Secondary | ICD-10-CM | POA: Diagnosis present

## 2015-11-23 DIAGNOSIS — E10649 Type 1 diabetes mellitus with hypoglycemia without coma: Secondary | ICD-10-CM | POA: Diagnosis not present

## 2015-11-23 DIAGNOSIS — D72828 Other elevated white blood cell count: Secondary | ICD-10-CM | POA: Diagnosis present

## 2015-11-23 DIAGNOSIS — K219 Gastro-esophageal reflux disease without esophagitis: Secondary | ICD-10-CM | POA: Diagnosis present

## 2015-11-23 DIAGNOSIS — E1010 Type 1 diabetes mellitus with ketoacidosis without coma: Principal | ICD-10-CM

## 2015-11-23 DIAGNOSIS — E860 Dehydration: Secondary | ICD-10-CM | POA: Diagnosis present

## 2015-11-23 DIAGNOSIS — E785 Hyperlipidemia, unspecified: Secondary | ICD-10-CM | POA: Diagnosis present

## 2015-11-23 DIAGNOSIS — N179 Acute kidney failure, unspecified: Secondary | ICD-10-CM

## 2015-11-23 DIAGNOSIS — D72829 Elevated white blood cell count, unspecified: Secondary | ICD-10-CM

## 2015-11-23 DIAGNOSIS — I6330 Cerebral infarction due to thrombosis of unspecified cerebral artery: Secondary | ICD-10-CM | POA: Diagnosis not present

## 2015-11-23 DIAGNOSIS — E871 Hypo-osmolality and hyponatremia: Secondary | ICD-10-CM | POA: Diagnosis present

## 2015-11-23 DIAGNOSIS — R1110 Vomiting, unspecified: Secondary | ICD-10-CM | POA: Diagnosis present

## 2015-11-23 DIAGNOSIS — Z7902 Long term (current) use of antithrombotics/antiplatelets: Secondary | ICD-10-CM | POA: Diagnosis not present

## 2015-11-23 DIAGNOSIS — Z79899 Other long term (current) drug therapy: Secondary | ICD-10-CM | POA: Diagnosis not present

## 2015-11-23 DIAGNOSIS — E139 Other specified diabetes mellitus without complications: Secondary | ICD-10-CM | POA: Diagnosis not present

## 2015-11-23 DIAGNOSIS — Z952 Presence of prosthetic heart valve: Secondary | ICD-10-CM | POA: Diagnosis not present

## 2015-11-23 DIAGNOSIS — I252 Old myocardial infarction: Secondary | ICD-10-CM | POA: Diagnosis not present

## 2015-11-23 DIAGNOSIS — E10319 Type 1 diabetes mellitus with unspecified diabetic retinopathy without macular edema: Secondary | ICD-10-CM | POA: Diagnosis present

## 2015-11-23 LAB — BASIC METABOLIC PANEL
ANION GAP: 23 — AB (ref 5–15)
BUN: 25 mg/dL — ABNORMAL HIGH (ref 6–20)
CALCIUM: 9.6 mg/dL (ref 8.9–10.3)
CO2: 16 mmol/L — AB (ref 22–32)
CREATININE: 1.27 mg/dL — AB (ref 0.44–1.00)
Chloride: 91 mmol/L — ABNORMAL LOW (ref 101–111)
GFR, EST AFRICAN AMERICAN: 54 mL/min — AB (ref >60)
GFR, EST NON AFRICAN AMERICAN: 47 mL/min — AB (ref >60)
Glucose, Bld: 766 mg/dL (ref 65–99)
Potassium: 5.5 mmol/L — ABNORMAL HIGH (ref 3.5–5.1)
SODIUM: 130 mmol/L — AB (ref 135–145)

## 2015-11-23 LAB — CBC WITH DIFFERENTIAL/PLATELET
BASOS PCT: 0 %
Basophils Absolute: 0 10*3/uL (ref 0.0–0.1)
Eosinophils Absolute: 0 10*3/uL (ref 0.0–0.7)
Eosinophils Relative: 0 %
HEMATOCRIT: 30.1 % — AB (ref 36.0–46.0)
Hemoglobin: 9.5 g/dL — ABNORMAL LOW (ref 12.0–15.0)
LYMPHS ABS: 3 10*3/uL (ref 0.7–4.0)
Lymphocytes Relative: 19 %
MCH: 24.7 pg — AB (ref 26.0–34.0)
MCHC: 31.6 g/dL (ref 30.0–36.0)
MCV: 78.4 fL (ref 78.0–100.0)
MONO ABS: 0.7 10*3/uL (ref 0.1–1.0)
MONOS PCT: 5 %
NEUTROS ABS: 12.3 10*3/uL — AB (ref 1.7–7.7)
Neutrophils Relative %: 76 %
Platelets: 273 10*3/uL (ref 150–400)
RBC: 3.84 MIL/uL — ABNORMAL LOW (ref 3.87–5.11)
RDW: 15.2 % (ref 11.5–15.5)
WBC: 16.1 10*3/uL — ABNORMAL HIGH (ref 4.0–10.5)

## 2015-11-23 LAB — COMPREHENSIVE METABOLIC PANEL
ALK PHOS: 70 U/L (ref 38–126)
ALT: 10 U/L — ABNORMAL LOW (ref 14–54)
ANION GAP: 9 (ref 5–15)
AST: 13 U/L — ABNORMAL LOW (ref 15–41)
Albumin: 3.3 g/dL — ABNORMAL LOW (ref 3.5–5.0)
BUN: 17 mg/dL (ref 6–20)
CALCIUM: 8.8 mg/dL — AB (ref 8.9–10.3)
CO2: 25 mmol/L (ref 22–32)
Chloride: 103 mmol/L (ref 101–111)
Creatinine, Ser: 0.84 mg/dL (ref 0.44–1.00)
GFR calc non Af Amer: >60 mL/min (ref >60)
Glucose, Bld: 164 mg/dL — ABNORMAL HIGH (ref 65–99)
Potassium: 3.8 mmol/L (ref 3.5–5.1)
SODIUM: 137 mmol/L (ref 135–145)
TOTAL PROTEIN: 6.2 g/dL — AB (ref 6.5–8.1)
Total Bilirubin: 0.6 mg/dL (ref 0.3–1.2)

## 2015-11-23 LAB — URINALYSIS, ROUTINE W REFLEX MICROSCOPIC
Bilirubin Urine: NEGATIVE
Glucose, UA: >1000 mg/dL — AB
Hgb urine dipstick: NEGATIVE
Ketones, ur: >80 mg/dL — AB
NITRITE: NEGATIVE
PROTEIN: NEGATIVE mg/dL
SPECIFIC GRAVITY, URINE: 1.026 (ref 1.005–1.030)
pH: 5.5 (ref 5.0–8.0)

## 2015-11-23 LAB — CBC
HCT: 33.9 % — ABNORMAL LOW (ref 36.0–46.0)
HEMOGLOBIN: 10.8 g/dL — AB (ref 12.0–15.0)
MCH: 25.1 pg — ABNORMAL LOW (ref 26.0–34.0)
MCHC: 31.9 g/dL (ref 30.0–36.0)
MCV: 78.8 fL (ref 78.0–100.0)
PLATELETS: 313 10*3/uL (ref 150–400)
RBC: 4.3 MIL/uL (ref 3.87–5.11)
RDW: 15.1 % (ref 11.5–15.5)
WBC: 17.8 10*3/uL — AB (ref 4.0–10.5)

## 2015-11-23 LAB — URINE MICROSCOPIC-ADD ON

## 2015-11-23 LAB — MAGNESIUM: MAGNESIUM: 1.7 mg/dL (ref 1.7–2.4)

## 2015-11-23 LAB — CBG MONITORING, ED
GLUCOSE-CAPILLARY: 522 mg/dL — AB (ref 65–99)
GLUCOSE-CAPILLARY: 401 mg/dL — AB (ref 65–99)
GLUCOSE-CAPILLARY: 260 mg/dL — AB (ref 65–99)
GLUCOSE-CAPILLARY: 200 mg/dL — AB (ref 65–99)
GLUCOSE-CAPILLARY: 122 mg/dL — AB (ref 65–99)
Glucose-Capillary: 385 mg/dL — ABNORMAL HIGH (ref 65–99)
Glucose-Capillary: 339 mg/dL — ABNORMAL HIGH (ref 65–99)
Glucose-Capillary: 164 mg/dL — ABNORMAL HIGH (ref 65–99)
Glucose-Capillary: 161 mg/dL — ABNORMAL HIGH (ref 65–99)
Glucose-Capillary: 157 mg/dL — ABNORMAL HIGH (ref 65–99)
Glucose-Capillary: 117 mg/dL — ABNORMAL HIGH (ref 65–99)
Glucose-Capillary: 117 mg/dL — ABNORMAL HIGH (ref 65–99)

## 2015-11-23 LAB — GLUCOSE, CAPILLARY: Glucose-Capillary: 123 mg/dL — ABNORMAL HIGH (ref 65–99)

## 2015-11-23 LAB — TROPONIN I

## 2015-11-23 LAB — PROTIME-INR
INR: 1.35 (ref 0.00–1.49)
PROTHROMBIN TIME: 16.7 s — AB (ref 11.6–15.2)

## 2015-11-23 MED ORDER — DEXTROSE 50 % IV SOLN: 25.0000 mL | INTRAVENOUS | Status: DC | PRN

## 2015-11-23 MED ORDER — WARFARIN - PHARMACIST DOSING INPATIENT: Freq: Every day | Status: DC

## 2015-11-23 MED ORDER — DEXTROSE-NACL 5-0.45 % IV SOLN: INTRAVENOUS | Status: DC

## 2015-11-23 MED ORDER — INSULIN ASPART 100 UNIT/ML ~~LOC~~ SOLN: 0.0000 [IU] | Freq: Three times a day (TID) | SUBCUTANEOUS | Status: DC

## 2015-11-23 MED ORDER — ACETAMINOPHEN 325 MG PO TABS: 650.0000 mg | ORAL_TABLET | Freq: Four times a day (QID) | ORAL | Status: DC | PRN

## 2015-11-23 MED ORDER — SODIUM CHLORIDE 0.9 % IV SOLN
INTRAVENOUS | Status: DC
  Filled 2015-11-23: qty 2.5

## 2015-11-23 MED ORDER — INSULIN ASPART 100 UNIT/ML ~~LOC~~ SOLN
0.0000 [IU] | Freq: Three times a day (TID) | SUBCUTANEOUS | Status: DC
  Administered 2015-11-23: 2 [IU] via SUBCUTANEOUS

## 2015-11-23 MED ORDER — ONDANSETRON HCL 4 MG/2ML IJ SOLN
4.0000 mg | Freq: Four times a day (QID) | INTRAMUSCULAR | Status: DC | PRN
  Administered 2015-11-23: 4 mg via INTRAVENOUS
  Filled 2015-11-23: qty 2

## 2015-11-23 MED ORDER — SODIUM CHLORIDE 0.9 % IV SOLN: INTRAVENOUS | Status: DC

## 2015-11-23 MED ORDER — ASPIRIN EC 81 MG PO TBEC
81.0000 mg | DELAYED_RELEASE_TABLET | Freq: Every day | ORAL | Status: DC
  Administered 2015-11-23 – 2015-11-25 (×3): 81 mg via ORAL
  Filled 2015-11-23 (×4): qty 1

## 2015-11-23 MED ORDER — TRAZODONE HCL 50 MG PO TABS: 25.0000 mg | ORAL_TABLET | Freq: Every evening | ORAL | Status: DC | PRN

## 2015-11-23 MED ORDER — INSULIN REGULAR BOLUS VIA INFUSION
0.0000 [IU] | Freq: Three times a day (TID) | INTRAVENOUS | Status: DC
Start: 2015-11-23 — End: 2015-11-23
  Filled 2015-11-23: qty 10

## 2015-11-23 MED ORDER — ENOXAPARIN SODIUM 80 MG/0.8ML ~~LOC~~ SOLN
70.0000 mg | Freq: Two times a day (BID) | SUBCUTANEOUS | Status: DC
  Administered 2015-11-23 – 2015-11-25 (×5): 70 mg via SUBCUTANEOUS
  Filled 2015-11-23 (×9): qty 0.8

## 2015-11-23 MED ORDER — PRAVASTATIN SODIUM 20 MG PO TABS
20.0000 mg | ORAL_TABLET | Freq: Every day | ORAL | Status: DC
  Administered 2015-11-23 – 2015-11-25 (×3): 20 mg via ORAL
  Filled 2015-11-23 (×4): qty 1

## 2015-11-23 MED ORDER — SODIUM CHLORIDE 0.9 % IV BOLUS (SEPSIS)
1000.0000 mL | Freq: Once | INTRAVENOUS | Status: AC
  Administered 2015-11-23: 1000 mL via INTRAVENOUS

## 2015-11-23 MED ORDER — CLOPIDOGREL BISULFATE 75 MG PO TABS
75.0000 mg | ORAL_TABLET | Freq: Every day | ORAL | Status: DC
  Administered 2015-11-23 – 2015-11-24 (×2): 75 mg via ORAL
  Filled 2015-11-23 (×3): qty 1

## 2015-11-23 MED ORDER — BACLOFEN 10 MG PO TABS
10.0000 mg | ORAL_TABLET | Freq: Two times a day (BID) | ORAL | Status: DC
  Administered 2015-11-23 – 2015-11-25 (×5): 10 mg via ORAL
  Filled 2015-11-23 (×7): qty 1

## 2015-11-23 MED ORDER — ESCITALOPRAM OXALATE 20 MG PO TABS
20.0000 mg | ORAL_TABLET | Freq: Every day | ORAL | Status: DC
  Administered 2015-11-23 – 2015-11-25 (×3): 20 mg via ORAL
  Filled 2015-11-23: qty 2
  Filled 2015-11-23 (×2): qty 1

## 2015-11-23 MED ORDER — PANTOPRAZOLE SODIUM 40 MG PO TBEC
40.0000 mg | DELAYED_RELEASE_TABLET | Freq: Every day | ORAL | Status: DC
  Administered 2015-11-23 – 2015-11-25 (×3): 40 mg via ORAL
  Filled 2015-11-23 (×3): qty 1

## 2015-11-23 MED ORDER — ONDANSETRON HCL 4 MG/2ML IJ SOLN
4.0000 mg | Freq: Once | INTRAMUSCULAR | Status: AC
  Administered 2015-11-23: 4 mg via INTRAVENOUS
  Filled 2015-11-23: qty 2

## 2015-11-23 MED ORDER — ONDANSETRON HCL 4 MG PO TABS: 4.0000 mg | ORAL_TABLET | Freq: Four times a day (QID) | ORAL | Status: DC | PRN

## 2015-11-23 MED ORDER — LISINOPRIL 2.5 MG PO TABS
2.5000 mg | ORAL_TABLET | Freq: Every day | ORAL | Status: DC
  Filled 2015-11-23: qty 1

## 2015-11-23 MED ORDER — DARIFENACIN HYDROBROMIDE ER 7.5 MG PO TB24
7.5000 mg | ORAL_TABLET | Freq: Every day | ORAL | Status: DC
  Administered 2015-11-23 – 2015-11-25 (×3): 7.5 mg via ORAL
  Filled 2015-11-23 (×4): qty 1

## 2015-11-23 MED ORDER — MUPIROCIN 2 % EX OINT
TOPICAL_OINTMENT | Freq: Two times a day (BID) | CUTANEOUS | Status: DC
  Administered 2015-11-23 – 2015-11-25 (×4): via NASAL
  Filled 2015-11-23 (×3): qty 22

## 2015-11-23 MED ORDER — INSULIN DETEMIR 100 UNIT/ML ~~LOC~~ SOLN
15.0000 [IU] | Freq: Two times a day (BID) | SUBCUTANEOUS | Status: DC
  Administered 2015-11-23 (×2): 15 [IU] via SUBCUTANEOUS
  Filled 2015-11-23 (×3): qty 0.15

## 2015-11-23 MED ORDER — METOPROLOL TARTRATE 25 MG PO TABS
12.5000 mg | ORAL_TABLET | Freq: Two times a day (BID) | ORAL | Status: DC
  Filled 2015-11-23: qty 0.5
  Filled 2015-11-23: qty 1

## 2015-11-23 MED ORDER — WARFARIN SODIUM 5 MG PO TABS
7.5000 mg | ORAL_TABLET | Freq: Once | ORAL | Status: AC
  Administered 2015-11-23: 7.5 mg via ORAL
  Filled 2015-11-23 (×2): qty 1

## 2015-11-23 MED ORDER — SODIUM CHLORIDE 0.9 % IV SOLN
INTRAVENOUS | Status: DC
  Administered 2015-11-23: 18:00:00 via INTRAVENOUS

## 2015-11-23 NOTE — ED Notes (Signed)
Bedside commode used. Pt tolerated well.

## 2015-11-23 NOTE — ED Notes (Signed)
IV team at bedside 

## 2015-11-23 NOTE — Progress Notes (Signed)
ANTICOAGULATION CONSULT NOTE - Initial Consult  Pharmacy Consult for warfarin Indication: aortic valve replacement  Allergies  Allergen Reactions  . Other Other (See Comments)    Can burn skin if left on too long.  . Celebrex [Celecoxib] Rash  . Detrol [Tolterodine] Hives    Patient Measurements: Height: 5\' 3"  (160 cm) Weight: 147 lb (66.679 kg) IBW/kg (Calculated) : 52.4 Heparin Dosing Weight:   Vital Signs: Temp: 99.3 F (37.4 C) (01/04 1307) Temp Source: Oral (01/04 1307) BP: 91/48 mmHg (01/04 1307) Pulse Rate: 79 (01/04 1307)  Labs:  Recent Labs  11/23/15 0023 11/23/15 0140 11/23/15 1114 11/23/15 1331  HGB 10.8*  --  9.5*  --   HCT 33.9*  --  30.1*  --   PLT 313  --  273  --   LABPROT  --   --   --  16.7*  INR  --   --   --  1.35  CREATININE 1.27*  --  0.84  --   TROPONINI  --  <0.03  --   --     Estimated Creatinine Clearance: 70.2 mL/min (by C-G formula based on Cr of 0.84).   Medical History: Past Medical History  Diagnosis Date  . CAD (coronary artery disease)   . Obesity   . Hypercholesteremia   . HTN (hypertension)   . Dyslipidemia   . Aortic stenosis   . Thrombophlebitis   . Heart murmur   . Myocardial infarction (HCC) 12/2014  . Pneumonia 11/2014; 12/2014  . GERD (gastroesophageal reflux disease)   . Stroke syndrome [facility]) 1995; 2001    "when my son was born; problems w/speech and L hand since then" (01/19/2015)  . Anxiety   . Depression   . Allergy   . CHF (congestive heart failure) (HCC)   . Rheumatoid arthritis(714.0)     "hands" (07/01/2015)  . Diabetes (HCC) dx'd 1982    "went straight to insulin"   Assessment: 54 YOF on warfarin for aortic valve replacement.  INR subtherapeutic at admission.  Patient also started on therapeutic enoxaparin at admission.  NOTE: she was no longer on enoxaparin prior to admission.   Home warfarin dose: 7.5mg  daily (last dose 1/3)  Today's labs, 11/23/2015:  INR subtherapeutic  CBC: anemia noted,  pltc WNL  SCr WNL  Drug interactions: plavix and ASA ordered.  Medication history states PCP stopped plavix  Goal of Therapy:  INR 2.5 - 3.5 Monitor platelets by anticoagulation protocol: Yes   Plan:   Warfarin 7.5mg  PO x 1 tonight  Enoxaparin 70mg  SQ q12h - dose appropriate for wt and renal fx  Daily INR  Enoxaparin until INR therapeutic  CBC at least q72h  01/21/2016, PharmD, BCPS.   Pager: 11/23/2015 2:24 PM

## 2015-11-23 NOTE — Progress Notes (Signed)
I have seen and assessed patient and agree with Dr. Michaelle Copas assessment and plan. 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 Patient was noted to be in diabetic ketoacidosis. Patient also noted on admission to have a leukocytosis, acute renal failure and borderline blood pressure. Patient currently on the glucose stabilizer with improvement with his symptoms. Labs pending. Acute abdominal series consistent with constipation and chest x-ray negative for any acute infiltrate. Urine cultures pending. Patient has had 3 CBGs ranging from 161 -200. If AG is closed and patient is no longer acidotic, and one more CBG less than 250 will transition to subcutaneous Levemir 15 units twice a day and place on a sliding scale insulin. Patient's leukocytosis might be reactive. Repeat CBC. Increase IV fluids to 1 25 mL per hour and monitor closely for volume overload. Follow.

## 2015-11-23 NOTE — Progress Notes (Signed)
Inpatient Diabetes Program Recommendations  AACE/ADA: New Consensus Statement on Inpatient Glycemic Control (2015)  Target Ranges:  Prepandial:   less than 140 mg/dL      Peak postprandial:   less than 180 mg/dL (1-2 hours)      Critically ill patients:  140 - 180 mg/dL   Review of Glycemic Control  Diabetes history: DM1 Outpatient Diabetes medications: NPH 32 units QAM Current orders for Inpatient glycemic control: IV insulin via GlucoStabilizer per DKA order set   55 year old female with a past medical history significant for diabetes mellitus type 1, combined diastolic and systolic congestive heart failure last EF 45-50% in 07/2015, CAD s/p CABG and PCI, aortic valve replacement; who presents with complaints of elevated blood sugars, nausea, and vomiting. Found to be in DKA. Sees Dr. Loanne Drilling - endo.  Results for AUDRIANA, ALDAMA (MRN [id number]) as of 11/23/2015 09:56  Ref. Range 11/23/2015 04:44 11/23/2015 05:45 11/23/2015 06:42 11/23/2015 08:22 11/23/2015 09:30  Glucose-Capillary Latest Ref Range: 65-99 mg/dL 385 (H) 339 (H) 260 (H) 200 (H) 164 (H)  Results for JOVAN, SCHICKLING (MRN [id number]) as of 11/23/2015 09:56  Ref. Range 11/23/2015 00:23  Sodium Latest Ref Range: 135-145 mmol/L 130 (L)  Potassium Latest Ref Range: 3.5-5.1 mmol/L 5.5 (H)  Chloride Latest Ref Range: 101-111 mmol/L 91 (L)  CO2 Latest Ref Range: 22-32 mmol/L 16 (L)  BUN Latest Ref Range: 6-20 mg/dL 25 (H)  Creatinine Latest Ref Range: 0.44-1.00 mg/dL 1.27 (H)  Calcium Latest Ref Range: 8.9-10.3 mg/dL 9.6  EGFR (Non-African Amer.) Latest Ref Range: >60 mL/min 47 (L)  EGFR (African American) Latest Ref Range: >60 mL/min 54 (L)   Glucose Latest Ref Range: 65-99 mg/dL 766 (HH)  Anion gap Latest Ref Range: 5-15  23 (H)  Results for WM, FRUCHTER (MRN [id number]) as of 11/23/2015 09:56  Ref. Range 08/31/2015 11:18  Hemoglobin A1C Unknown 8.3    Inpatient Diabetes Program Recommendations:    Continue with GlucoStabilizer until  criteria met for discontinuation. Levemir 15 units bid Give basal insulin 2 hours prior to discontinuing insulin drip. When transitioned to SQ insulin, Novolog sensitive tidwc and hs When po intake is advanced, Novolog 4 units tidwc for meal coverage insulin  Will need updated HgbA1C to assess glycemic control prior to hospitalization. Needs to be discharged on both basal and bolus insulin for improved glycemic control. Will speak with pt when she is admitted.  Continue to follow. Thank you. Lorenda Peck, RD, LDN, CDE Inpatient Diabetes Coordinator [phone]

## 2015-11-23 NOTE — H&P (Signed)
[facility] History and Physical  Madison [name] BPZ:[id number] DOB: July 22, 1961 DOA: 11/22/2015  Referring physician: ED PCP: [name], NP   Chief Complaint: Nausea and vomiting  HPI:   [name]  is a 55 year old female with a past medical history significant for diabetes mellitus type 1, combined diastolic and systolic congestive heart failure last EF 45-50% in 07/2015, CAD s/p  CABG and PCI, aortic valve replacement; who presents with complaints of elevated blood sugars, nausea, and vomiting.  Patient notes that symptoms started after she went to go out to eat with her sister for lunch at a [employer] yesterday. She states she mostly ate chicken. Normally she states that she is very compliant with her diabetic regimen and eats good and stays away from carbs and sugars. She currently has a regimen of taking Humulin insulin 32 units Benkelman daily and she has not been givien any short acting insulin. On average her blood sugars have been ranging from the 150s to the low 200s at home. However, once she got home around 5 PM this afternoon she had acute onset of nausea and vomiting. She checked her blood sugars at that time and the meter was reading high. She was unable to keep anything down and therefore presented to the emergency department for further evaluation.  Otherwise patient denies having any fever, chills, or recent sick.   Upon admission into the emergency department the patient was noted have a blood glucose of 766 with anion gap of 23. UA was positive for ketones. She was given 2 L of normal saline IV fluids, antiemetics, and started on a glucose stabilizer while in the emergency department.  Review of Systems  Constitutional: Negative for fever and chills.  Respiratory: Negative for hemoptysis, sputum production and wheezing.   Cardiovascular: Negative for orthopnea and claudication.  Gastrointestinal: Positive for nausea and vomiting.  Genitourinary: Positive for frequency.  Negative for urgency.  Musculoskeletal: Positive for joint pain.  Skin: Negative for itching and rash.  Neurological: Negative for speech change and focal weakness.  Endo/Heme/Allergies: Positive for polydipsia. Negative for environmental allergies.  Psychiatric/Behavioral: Negative for suicidal ideas and memory loss.      Past Medical History  Diagnosis Date  . CAD (coronary artery disease)   . Obesity   . Hypercholesteremia   . HTN (hypertension)   . Dyslipidemia   . Aortic stenosis   . Thrombophlebitis   . Heart murmur   . Myocardial infarction (HCC) 12/2014  . Pneumonia 11/2014; 12/2014  . GERD (gastroesophageal reflux disease)   . Stroke syndrome [facility]) 1995; 2001    "when my son was born; problems w/speech and L hand since then" (01/19/2015)  . Anxiety   . Depression   . Allergy   . CHF (congestive heart failure) (HCC)   . Rheumatoid arthritis(714.0)     "hands" (07/01/2015)  . Diabetes (HCC) dx'd 1982    "went straight to insulin"     Past Surgical History  Procedure Laterality Date  . Cesarean section  1995  . Foot fracture surgery    . Knee arthroscopy Right   . Neuroplasty / transposition median nerve at carpal tunnel    . Left heart catheterization with coronary angiogram N/A 12/08/2014    Procedure: LEFT HEART CATHETERIZATION WITH CORONARY ANGIOGRAM;  Surgeon: Iran Ouch, MD;  Location: [facility] CATH LAB;  Service: Cardiovascular;  Laterality: N/A;  . Percutaneous coronary stent intervention (pci-s) N/A 12/09/2014    Procedure: PERCUTANEOUS CORONARY STENT INTERVENTION (PCI-S);  Surgeon: [name], MD;  Location: [facility] CATH LAB;  Service: Cardiovascular;  Laterality: N/A;  . Cardiac catheterization  12/08/2014  . Coronary angioplasty with stent placement  12/09/2014  . Aortic valve replacement (avr)/coronary artery bypass grafting (cabg)  "2014"    [name] 03/08/2014  . Fracture surgery    . Tubal ligation  1995  . Cataract extraction Left   . Coronary artery  bypass graft    . Orif ankle fracture Left 07/04/2015    Procedure: OPEN REDUCTION INTERNAL FIXATION (ORIF)  TRIMAL ANKLE FRACTURE;  Surgeon: [name], MD;  Location: [facility] OR;  Service: Orthopedics;  Laterality: Left;      Social History:  reports that she has never smoked. She has never used smokeless tobacco. She reports that she does not drink alcohol or use illicit drugs. Where does patient live--home  and with whom if at home? roomate Can patient participate in ADLs? yes  Allergies  Allergen Reactions  . Other Other (See Comments)    Can burn skin if left on too long.  . Celebrex [Celecoxib] Rash  . Detrol [Tolterodine] Hives    Family History  Problem Relation Age of Onset  . Stroke    . Heart disease Mother   . Heart disease Sister       Prior to Admission medications   Medication Sig Start Date End Date Taking? Authorizing Provider  acetaminophen (TYLENOL) 325 MG tablet Take 2 tablets (650 mg total) by mouth every 6 (six) hours as needed for mild pain (temp > 101.5). 08/09/15   Leana Roe Elgergawy, MD  amoxicillin (AMOXIL) 500 MG capsule Take 1 capsule (500 mg total) by mouth 2 (two) times daily. 10/19/15   [name], NP  aspirin 81 MG tablet Take 1 tablet (81 mg total) by mouth daily. 10/11/14   Leana Roe Elgergawy, MD  baclofen (LIORESAL) 10 MG tablet TAKE 1 TABLET (10 MG TOTAL) BY MOUTH 2 (TWO) TIMES DAILY. 06/29/15   [name], NP  clopidogrel (PLAVIX) 75 MG tablet TAKE 1 TABLET (75 MG TOTAL) BY MOUTH DAILY WITH BREAKFAST. 09/14/15   Wendall Stade, MD  enoxaparin (LOVENOX) 80 MG/0.8ML injection Inject 0.7 mLs (70 mg total) into the skin every 12 (twelve) hours. 08/09/15   Leana Roe Elgergawy, MD  escitalopram (LEXAPRO) 20 MG tablet Take 1 tablet (20 mg total) by mouth daily. 11/03/15   [name], NP  Insulin NPH, Human,, Isophane, (HUMULIN N) 100 UNIT/ML Kiwkpen Inject 32 Units into the skin every morning. And pen needles 1/day 08/31/15   Romero Belling, MD   LANTUS SOLOSTAR 100 UNIT/ML Solostar Pen INJECT 50 UNITS AT 10 PM 11/04/15   Romero Belling, MD  lisinopril (PRINIVIL,ZESTRIL) 2.5 MG tablet Take 1 tablet (2.5 mg total) by mouth daily. 09/12/15   [name], NP  metoprolol tartrate (LOPRESSOR) 25 MG tablet Take 12.5 mg by mouth 2 (two) times daily.    Historical Provider, MD  mupirocin ointment (BACTROBAN) 2 % Place into the nose 2 (two) times daily. Please apply to cheecks 08/09/15   Leana Roe Elgergawy, MD  omeprazole (PRILOSEC) 40 MG capsule TAKE 1 CAPSULE (40 MG TOTAL) BY MOUTH DAILY. 09/12/15   [name], NP  pravastatin (PRAVACHOL) 20 MG tablet Take 1 tablet (20 mg total) by mouth daily. 08/17/15   [name], NP  pravastatin (PRAVACHOL) 20 MG tablet TAKE 1 TABLET BY MOUTH EVERY DAY 11/15/15   [name], NP  RHEUMATREX 2.5 MG tablet TAKE 1 TABLET 2  TIMES A WEEK 05/24/15   [name], NP  solifenacin (VESICARE) 5 MG tablet Take 1 tablet (5 mg total) by mouth daily. 05/03/15   [name], NP  traZODone (DESYREL) 50 MG tablet Take 0.5-1 tablets (25-50 mg total) by mouth at bedtime as needed for sleep. 09/13/15   [name], NP     Physical Exam: Filed Vitals:   11/22/15 2309 11/23/15 0030  BP: 145/60 105/40  Pulse: 95 95  Temp: 98.8 F (37.1 C)   TempSrc: Oral   Resp: 18 18  Height: 5\' 3"  (1.6 m)   Weight: 66.679 kg (147 lb)   SpO2: 100% 96%     Constitutional: Vital signs reviewed. Patient is a well-developed and well-nourished in no acute distress and cooperative with exam. Alert and oriented x3.  Head: Normocephalic and atraumatic  Ear: TM normal bilaterally  Mouth: no erythema or exudates, dry mucous membranes   Eyes: PERRL, EOMI, conjunctivae normal, No scleral icterus.  Neck: Supple, Trachea midline normal ROM, No JVD, mass, thyromegaly, or carotid bruit present.  Cardiovascular: RRR, positive systolic ejection murmur, pulses symmetric and intact bilaterally  Pulmonary/Chest: CTAB, no wheezes, rales, or  rhonchi  Abdominal: Soft. Non-tender, non-distended, bowel sounds are normal, no masses, organomegaly, or guarding present.  GU: no CVA tenderness Musculoskeletal: No joint deformities, erythema, or stiffness, ROM full and no nontender Ext: no edema and no cyanosis, pulses palpable bilaterally (DP and PT)  Hematology: no cervical, inginal, or axillary adenopathy.  Neurological: A&O x3, Strenght is normal and symmetric bilaterally, cranial nerve II-XII are grossly intact, no focal motor deficit, sensory intact to light touch bilaterally.  Skin: Warm, dry and intact. No rash, cyanosis, or clubbing.  Psychiatric: Normal mood and affect. speech and behavior is normal. Judgment and thought content normal. Cognition and memory are normal.      Data Review   Micro Results No results found for this or any previous visit (from the past 240 hour(s)).  Radiology Reports No results found.   CBC  Recent Labs Lab 11/23/15 0023  WBC 17.8*  HGB 10.8*  HCT 33.9*  PLT 313  MCV 78.8  MCH 25.1*  MCHC 31.9  RDW 15.1    Chemistries   Recent Labs Lab 11/23/15 0023  NA 130*  K 5.5*  CL 91*  CO2 16*  GLUCOSE 766*  BUN 25*  CREATININE 1.27*  CALCIUM 9.6   ------------------------------------------------------------------------------------------------------------------ estimated creatinine clearance is 46.4 mL/min (by C-G formula based on Cr of 1.27). ------------------------------------------------------------------------------------------------------------------ No results for input(s): HGBA1C in the last 72 hours. ------------------------------------------------------------------------------------------------------------------ No results for input(s): CHOL, HDL, LDLCALC, TRIG, CHOLHDL, LDLDIRECT in the last 72 hours. ------------------------------------------------------------------------------------------------------------------ No results for input(s): TSH, T4TOTAL, T3FREE,  THYROIDAB in the last 72 hours.  Invalid input(s): FREET3 ------------------------------------------------------------------------------------------------------------------ No results for input(s): VITAMINB12, FOLATE, FERRITIN, TIBC, IRON, RETICCTPCT in the last 72 hours.  Coagulation profile  Recent Labs Lab 11/18/15  INR 1.4    No results for input(s): DDIMER in the last 72 hours.  Cardiac Enzymes  Recent Labs Lab 11/23/15 0140  TROPONINI <0.03   ------------------------------------------------------------------------------------------------------------------ Invalid input(s): POCBNP   CBG:  Recent Labs Lab 11/22/15 2313  GLUCAP >600*       ZJQ:DUKRC tachycardia Active Problems:     DKA (diabetic ketoacidoses): Acute. Patient is a type I diabetic who reports being only on Humulin 32 units daily. Patient went to [employer] yesterday and ate mostly chicken. However patient had acute onset of nausea vomiting. Patient found to have  elevated blood glucose of 766, anion gap of 23,  bicarbonate 16, and positive ketones on UA - Admit to stepdown unit  - Glucose stabilizer per protocol  - BMPs every 3 hours monitoring for closure  - Continuous pulse oximetry  - Social work consult   - May want to have patient have diabetic education and provide with short acting insulin as well at discharge    S/P aortic valve replacement  on Chronic anticoagulation: - Continue Lovenox per home dose adjusted if needed for acute kidney injury   Acute kidney injury [facility]): Baseline creatinine appears to be 0.75 acutely elevated on admission at 1.27. Suspect prerenal in nature considering patient's hyperglycemia  - continue IV fluids as seen above - Follow-up repeat BMPs  Combined systolic and diastolic congestive heart failure: Last EF 45-50% with grade 2 diastolic dysfunction noted in 12/8784  - Monitor respiratory status with continuous pulse oximetry decrease fluid rate if  needed   Leukocytosis: Acute. No clear source of infection at this time noted - checking cxr  Anemia: Chronic. Patient appears to be close to her baseline - Follow-up CBC in a.m.  Hyperkalemia : Potassium initially of 5.5 with no significant EKG changes seen - Continue to monitor follow-up repeat BMP   Hyponatremia secondary to hyperglycemia  - Continue to monitor  CAD (coronary artery disease) -  continue aspirin and Plavix  Hypertension  -Continue Metoprolol and lisinopril   Hyperlipidemia - Continue pravastatin   Rheumatoid arthritis (HCC): Stable -Will need to figure out when patient takes Rheumatrex      Code Status:   full Family Communication: bedside Disposition Plan: admit   Total time spent 55 minutes.Greater than 50% of this time was spent in counseling, explanation of diagnosis, planning of further management, and coordination of care  [name] [facility] Pager [phone]  If 7PM-7AM, please contact night-coverage [email or web address] Password [email or web address] 11/23/2015, 2:30 AM

## 2015-11-23 NOTE — ED Notes (Signed)
Awaiting lab cbc to start gluc stabilizer.

## 2015-11-23 NOTE — ED Notes (Signed)
Moving patient from bed to bedside commode pt became nauseated again. Will notify edp.

## 2015-11-23 NOTE — ED Notes (Signed)
Admitting at bedside 

## 2015-11-23 NOTE — ED Notes (Signed)
Pt. Can go to floor 1603

## 2015-11-24 DIAGNOSIS — E1010 Type 1 diabetes mellitus with ketoacidosis without coma: Secondary | ICD-10-CM | POA: Insufficient documentation

## 2015-11-24 LAB — CBC WITH DIFFERENTIAL/PLATELET
Basophils Absolute: 0 10*3/uL (ref 0.0–0.1)
Basophils Relative: 0 %
EOS ABS: 0.1 10*3/uL (ref 0.0–0.7)
EOS PCT: 1 %
HCT: 29.6 % — ABNORMAL LOW (ref 36.0–46.0)
HEMOGLOBIN: 9.5 g/dL — AB (ref 12.0–15.0)
LYMPHS ABS: 4.4 10*3/uL — AB (ref 0.7–4.0)
LYMPHS PCT: 42 %
MCH: 25.2 pg — AB (ref 26.0–34.0)
MCHC: 32.1 g/dL (ref 30.0–36.0)
MCV: 78.5 fL (ref 78.0–100.0)
MONOS PCT: 4 %
Monocytes Absolute: 0.4 10*3/uL (ref 0.1–1.0)
NEUTROS PCT: 53 %
Neutro Abs: 5.6 10*3/uL (ref 1.7–7.7)
Platelets: 264 10*3/uL (ref 150–400)
RBC: 3.77 MIL/uL — AB (ref 3.87–5.11)
RDW: 15.4 % (ref 11.5–15.5)
WBC: 10.5 10*3/uL (ref 4.0–10.5)

## 2015-11-24 LAB — GLUCOSE, CAPILLARY
GLUCOSE-CAPILLARY: 187 mg/dL — AB (ref 65–99)
GLUCOSE-CAPILLARY: 25 mg/dL — AB (ref 65–99)
GLUCOSE-CAPILLARY: 223 mg/dL — AB (ref 65–99)
Glucose-Capillary: 42 mg/dL — CL (ref 65–99)
Glucose-Capillary: 79 mg/dL (ref 65–99)
Glucose-Capillary: 263 mg/dL — ABNORMAL HIGH (ref 65–99)
Glucose-Capillary: 210 mg/dL — ABNORMAL HIGH (ref 65–99)

## 2015-11-24 LAB — URINE CULTURE

## 2015-11-24 LAB — BASIC METABOLIC PANEL
Anion gap: 5 (ref 5–15)
BUN: 10 mg/dL (ref 6–20)
CHLORIDE: 113 mmol/L — AB (ref 101–111)
CO2: 24 mmol/L (ref 22–32)
CREATININE: 0.81 mg/dL (ref 0.44–1.00)
Calcium: 8.3 mg/dL — ABNORMAL LOW (ref 8.9–10.3)
GFR calc Af Amer: >60 mL/min (ref >60)
GFR calc non Af Amer: >60 mL/min (ref >60)
GLUCOSE: 60 mg/dL — AB (ref 65–99)
POTASSIUM: 3.7 mmol/L (ref 3.5–5.1)
SODIUM: 142 mmol/L (ref 135–145)

## 2015-11-24 LAB — PROTIME-INR
INR: 1.76 — ABNORMAL HIGH (ref 0.00–1.49)
PROTHROMBIN TIME: 20.5 s — AB (ref 11.6–15.2)

## 2015-11-24 LAB — HEMOGLOBIN A1C
HEMOGLOBIN A1C: 12.3 % — AB (ref 4.8–5.6)
Mean Plasma Glucose: 306 mg/dL

## 2015-11-24 LAB — MRSA PCR SCREENING: MRSA BY PCR: NEGATIVE

## 2015-11-24 MED ORDER — SENNOSIDES-DOCUSATE SODIUM 8.6-50 MG PO TABS
1.0000 | ORAL_TABLET | Freq: Every day | ORAL | Status: DC
  Administered 2015-11-24: 1 via ORAL
  Filled 2015-11-24: qty 1

## 2015-11-24 MED ORDER — INSULIN ASPART 100 UNIT/ML ~~LOC~~ SOLN
0.0000 [IU] | Freq: Three times a day (TID) | SUBCUTANEOUS | Status: DC
  Administered 2015-11-24: 3 [IU] via SUBCUTANEOUS
  Administered 2015-11-24: 5 [IU] via SUBCUTANEOUS
  Administered 2015-11-25: 3 [IU] via SUBCUTANEOUS

## 2015-11-24 MED ORDER — POLYETHYLENE GLYCOL 3350 17 G PO PACK
17.0000 g | PACK | Freq: Every day | ORAL | Status: DC
  Administered 2015-11-24 – 2015-11-25 (×2): 17 g via ORAL
  Filled 2015-11-24 (×2): qty 1

## 2015-11-24 MED ORDER — WARFARIN SODIUM 5 MG PO TABS
7.5000 mg | ORAL_TABLET | Freq: Once | ORAL | Status: AC
  Administered 2015-11-24: 7.5 mg via ORAL
  Filled 2015-11-24: qty 1

## 2015-11-24 MED ORDER — INSULIN ASPART 100 UNIT/ML ~~LOC~~ SOLN
4.0000 [IU] | Freq: Three times a day (TID) | SUBCUTANEOUS | Status: DC
  Administered 2015-11-25 (×2): 4 [IU] via SUBCUTANEOUS

## 2015-11-24 MED ORDER — INSULIN DETEMIR 100 UNIT/ML ~~LOC~~ SOLN
15.0000 [IU] | Freq: Every day | SUBCUTANEOUS | Status: DC
  Administered 2015-11-24: 15 [IU] via SUBCUTANEOUS
  Filled 2015-11-24 (×2): qty 0.15

## 2015-11-24 NOTE — Progress Notes (Signed)
Hypoglycemic Event  CBG: 28  Treatment: OJ  Symptoms: none   Follow-up CBG: Time: 0800 CBG Result: 42  Possible Reasons for Event: Poor intake   Comments/MD notified:Thompson [id number]   CBG: 42 0800  Treatment: OJ  Symptoms: none   Follow-up CBG: Time: 0815  CBG Result: 79  Possible Reasons for Event: Poor intake  Comments/MD notified:Thompson [id number]    Lenox Ponds

## 2015-11-24 NOTE — Progress Notes (Signed)
ANTICOAGULATION CONSULT NOTE  Pharmacy Consult for warfarin Indication: aortic valve replacement  Allergies  Allergen Reactions  . Other Other (See Comments)    Can burn skin if left on too long.  . Celebrex [Celecoxib] Rash  . Detrol [Tolterodine] Hives    Patient Measurements: Height: 5\' 3"  (160 cm) Weight: 147 lb (66.679 kg) IBW/kg (Calculated) : 52.4  Vital Signs: Temp: 98.2 F (36.8 C) (01/05 0605) Temp Source: Oral (01/05 0605) BP: 110/45 mmHg (01/05 0605) Pulse Rate: 78 (01/05 0605)  Labs:  Recent Labs  11/23/15 0023 11/23/15 0140 11/23/15 1114 11/23/15 1331 11/24/15 0457  HGB 10.8*  --  9.5*  --  9.5*  HCT 33.9*  --  30.1*  --  29.6*  PLT 313  --  273  --  264  LABPROT  --   --   --  16.7* 20.5*  INR  --   --   --  1.35 1.76*  CREATININE 1.27*  --  0.84  --  0.81  TROPONINI  --  <0.03  --   --   --     Estimated Creatinine Clearance: 72.8 mL/min (by C-G formula based on Cr of 0.81).   Medical History: Past Medical History  Diagnosis Date  . CAD (coronary artery disease)   . Obesity   . Hypercholesteremia   . HTN (hypertension)   . Dyslipidemia   . Aortic stenosis   . Thrombophlebitis   . Heart murmur   . Myocardial infarction (HCC) 12/2014  . Pneumonia 11/2014; 12/2014  . GERD (gastroesophageal reflux disease)   . Stroke syndrome [facility]) 1995; 2001    "when my son was born; problems w/speech and L hand since then" (01/19/2015)  . Anxiety   . Depression   . Allergy   . CHF (congestive heart failure) (HCC)   . Rheumatoid arthritis(714.0)     "hands" (07/01/2015)  . Diabetes (HCC) dx'd 1982    "went straight to insulin"   Assessment: 54 YOF on warfarin for aortic valve replacement.  INR subtherapeutic at admission.  Patient also started on therapeutic enoxaparin at admission.  NOTE: she was no longer on enoxaparin prior to admission.   Home warfarin dose: 7.5mg  daily (last dose 1/3)  Today's labs, 11/24/2015:  INR subtherapeutic, but rising to  goal   CBC: stable, anemia noted  SCr WNL  Drug interactions: plavix and ASA ordered.  Medication history states PCP stopped plavix  Goal of Therapy:  INR 2.5 - 3.5 Monitor platelets by anticoagulation protocol: Yes   Plan:   Warfarin 7.5mg  PO x 1 tonight  Enoxaparin 70mg  SQ q12h - dose appropriate for wt and renal fx  Daily INR  Enoxaparin until INR therapeutic  CBC at least q72h  01/22/2016, PharmD, BCPS Pager: [phone] 11/24/2015 8:37 AM

## 2015-11-24 NOTE — Progress Notes (Signed)
Inpatient Diabetes Program Recommendations  AACE/ADA: New Consensus Statement on Inpatient Glycemic Control (2015)  Target Ranges:  Prepandial:   less than 140 mg/dL      Peak postprandial:   less than 180 mg/dL (1-2 hours)      Critically ill patients:  140 - 180 mg/dL   Review of Glycemic Control  Results for ADER, FRITZE (MRN [id number]) as of 11/24/2015 13:59  Ref. Range 11/23/2015 00:23 11/23/2015 11:14 11/24/2015 04:57  Glucose Latest Ref Range: 65-99 mg/dL 694 (HH) 854 (H) 60 (L)   Had hypo this am. Asymptomatic. Agree with reducing Levemir. Spoke with pt regarding her insulin regimen at home. States she repeatedly asked her endo for rapid-acting insulin and he refused to write prescription. Only on NPH 32 units QAM. Very unhappy with diabetes care in outpatient setting and would like MD to recommend another endo. Pt states she checks her blood sugars several times each day and they usually are > 250 mg/dL.  Inpatient Diabetes Program Recommendations:    Will likely need increase in Levemir to 20 units. Add meal coverage insulin - Novolog 4 units tidwc and titrate until post-prandial blood sugars > 180 mg/dL.  Will continue to follow. Thank you. Ailene Ards, RD, LDN, CDE Inpatient Diabetes Coordinator [phone]

## 2015-11-24 NOTE — Progress Notes (Signed)
[facility] PROGRESS NOTE  Madison [name] QJJ:[id number] DOB: Mar 27, 1961 DOA: 11/22/2015 PCP: [name], NP  Assessment/Plan: #1 DKA Questionable etiology. Currently improved and resolved. Anion gap is closed. Patient has been transitioned to subcutaneous insulin. Patient noted to have low CBGs this morning of 25 which improved with orange juice. Patiently currently asymptomatic. Will decrease Levemir to 15 units daily. Continue sliding scale insulin. We'll place him meal coverage NovoLog 4 units 3 times daily with meals. Will likely need outpatient follow-up.  #2 constipation Patient with large bowel movement after soapsuds enema yesterday. Placed on MiraLAX daily. Senokot-S at bedtime.  #3 leukocytosis Likely reactive leukocytosis. Urinalysis with small leukocytes negative nitrite. Urine cultures pending. Chest x-ray no acute infiltrate. WBC trending down. No need for antibiotics at this time. Follow.  #4 status post aortic valve replacement INR is subtherapeutic. Patient on Lovenox bridge. Patient also on Coumadin. Coumadin per pharmacy. Goal INR 2.5-3.5. Patient states Plavix was discontinued and a such will DC Plavix.  #5 acute kidney injury Improved with hydration. Sitting lock IV fluids. Follow.  #6 combined systolic and diastolic heart failure EF of 45-50% with grade 2 diastolic dysfunction but 2-D echo of 9/ 2016. Saline lock IV fluids. Follow.  #7 anemia Stable.  #8 hyperkalemia Resolved.  #9 coronary artery disease Stable. Patient states Plavix was discontinued. Will DC Plavix. Continue aspirin and Coumadin.  #10 hypertension Patient was borderline hypotensive on admission. Antihypertensive medications on hold. Follow.  #11 hyperlipidemia Continue statin.  #12 rheumatoid arthritis Stable.  #13 prophylaxis On full dose Lovenox, DVT prophylaxis.  Code Status: Full Family Communication: Updated patient. No family present. Disposition Plan: Home once blood  glucose levels a stable and have improved.   Consultants:  None  Procedures:  Acute abdominal series 11/23/2015    Antibiotics:  None  HPI/Subjective: Patient states feeling much better. No nausea. No emesis. Patient denies any chest pain or shortness of breath. Patient was noted this morning to have CBGs in the 20s however was asymptomatic and improved with orange juice.  Objective: Filed Vitals:   11/23/15 2111 11/24/15 0605  BP: 104/42 110/45  Pulse: 84 78  Temp: 98.3 F (36.8 C) 98.2 F (36.8 C)  Resp: 16 16    Intake/Output Summary (Last 24 hours) at 11/24/15 1140 Last data filed at 11/23/15 1700  Gross per 24 hour  Intake    120 ml  Output      1 ml  Net    119 ml   Filed Weights   11/22/15 2309  Weight: 66.679 kg (147 lb)    Exam:   General:  NAD  Cardiovascular: RRR  Respiratory: CTAB  Abdomen: Soft, nontender, nondistended, positive bowel sounds.  Musculoskeletal: No clubbing cyanosis or edema.  Data Reviewed: Basic Metabolic Panel:  Recent Labs Lab 11/23/15 0023 11/23/15 1114 11/24/15 0457  NA 130* 137 142  K 5.5* 3.8 3.7  CL 91* 103 113*  CO2 16* 25 24  GLUCOSE 766* 164* 60*  BUN 25* 17 10  CREATININE 1.27* 0.84 0.81  CALCIUM 9.6 8.8* 8.3*  MG  --  1.7  --    Liver Function Tests:  Recent Labs Lab 11/23/15 1114  AST 13*  ALT 10*  ALKPHOS 70  BILITOT 0.6  PROT 6.2*  ALBUMIN 3.3*   No results for input(s): LIPASE, AMYLASE in the last 168 hours. No results for input(s): AMMONIA in the last 168 hours. CBC:  Recent Labs Lab 11/23/15 0023 11/23/15 1114 11/24/15 0457  WBC 17.8* 16.1* 10.5  NEUTROABS  --  12.3* 5.6  HGB 10.8* 9.5* 9.5*  HCT 33.9* 30.1* 29.6*  MCV 78.8 78.4 78.5  PLT 313 273 264   Cardiac Enzymes:  Recent Labs Lab 11/23/15 0140  TROPONINI <0.03   BNP (last 3 results)  Recent Labs  07/30/15 1400  BNP 398.4*    ProBNP (last 3 results) No results for input(s): PROBNP in the last 8760  hours.  CBG:  Recent Labs Lab 11/23/15 1147 11/23/15 1258 11/23/15 1401 11/23/15 1505 11/23/15 1647  GLUCAP 157* 117* 117* 122* 123*    No results found for this or any previous visit (from the past 240 hour(s)).   Studies: Dg Abd Acute W/chest  11/23/2015  CLINICAL DATA:  Leukocytosis and emesis EXAM: DG ABDOMEN ACUTE W/ 1V CHEST COMPARISON:  09/16/2015 FINDINGS: Normal heart size and mediastinal contours. The patient is status post median sternotomy for aortic valve replacement. There is no edema, consolidation, effusion, or pneumothorax. Prominent volume of formed stool distending multiple colonic segments. No evidence of bowel obstruction or perforation. No concerning intra-abdominal mass effect or calcification. Two densely calcified masses in the pelvis, usually calcified fibroids, the larger measuring 44 mm. Calcification is chronic from 2013 based on report. IMPRESSION: 1. Large volume stool suggesting constipation. Negative for bowel obstruction. 2. No evidence of acute cardiopulmonary disease. Electronically Signed   By: Marnee Spring M.D.   On: 11/23/2015 09:33    Scheduled Meds: . aspirin EC  81 mg Oral Daily  . baclofen  10 mg Oral BID  . clopidogrel  75 mg Oral Daily  . darifenacin  7.5 mg Oral Daily  . enoxaparin  70 mg Subcutaneous Q12H  . escitalopram  20 mg Oral Daily  . insulin aspart  0-9 Units Subcutaneous TID WC  . insulin detemir  15 Units Subcutaneous QHS  . mupirocin ointment   Nasal BID  . pantoprazole  40 mg Oral Daily  . polyethylene glycol  17 g Oral Daily  . pravastatin  20 mg Oral Daily  . senna-docusate  1 tablet Oral QHS  . warfarin  7.5 mg Oral ONCE-1800  . Warfarin - Pharmacist Dosing Inpatient   Does not apply q1800   Continuous Infusions:   Principal Problem:   DKA (diabetic ketoacidoses) (HCC) Active Problems:   CAD (coronary artery disease)   Hyperlipidemia   Rheumatoid arthritis (HCC)   Chronic anticoagulation   S/P aortic valve  replacement   Essential hypertension   Hyperkalemia   Acute kidney injury (HCC)   DKA, type 1 (HCC)   Constipation    Time spent: 35 mins    [facility] MD [facility] Pager [phone]. If 7PM-7AM, please contact night-coverage at [email or web address], password [facility] 11/24/2015, 11:40 AM  LOS: 1 day

## 2015-11-25 DIAGNOSIS — I10 Essential (primary) hypertension: Secondary | ICD-10-CM | POA: Diagnosis not present

## 2015-11-25 DIAGNOSIS — E139 Other specified diabetes mellitus without complications: Secondary | ICD-10-CM | POA: Diagnosis not present

## 2015-11-25 DIAGNOSIS — I6330 Cerebral infarction due to thrombosis of unspecified cerebral artery: Secondary | ICD-10-CM | POA: Diagnosis not present

## 2015-11-25 DIAGNOSIS — E1310 Other specified diabetes mellitus with ketoacidosis without coma: Secondary | ICD-10-CM

## 2015-11-25 LAB — BASIC METABOLIC PANEL
Anion gap: 9 (ref 5–15)
BUN: 8 mg/dL (ref 6–20)
CHLORIDE: 105 mmol/L (ref 101–111)
CO2: 26 mmol/L (ref 22–32)
Calcium: 8.6 mg/dL — ABNORMAL LOW (ref 8.9–10.3)
Creatinine, Ser: 0.76 mg/dL (ref 0.44–1.00)
GFR calc Af Amer: >60 mL/min (ref >60)
GFR calc non Af Amer: >60 mL/min (ref >60)
GLUCOSE: 306 mg/dL — AB (ref 65–99)
POTASSIUM: 4.1 mmol/L (ref 3.5–5.1)
Sodium: 140 mmol/L (ref 135–145)

## 2015-11-25 LAB — CBC
HCT: 30.9 % — ABNORMAL LOW (ref 36.0–46.0)
HEMOGLOBIN: 9.8 g/dL — AB (ref 12.0–15.0)
MCH: 25.4 pg — AB (ref 26.0–34.0)
MCHC: 31.7 g/dL (ref 30.0–36.0)
MCV: 80.1 fL (ref 78.0–100.0)
PLATELETS: 250 10*3/uL (ref 150–400)
RBC: 3.86 MIL/uL — AB (ref 3.87–5.11)
RDW: 15.8 % — ABNORMAL HIGH (ref 11.5–15.5)
WBC: 6.3 10*3/uL (ref 4.0–10.5)

## 2015-11-25 LAB — GLUCOSE, CAPILLARY
Glucose-Capillary: 204 mg/dL — ABNORMAL HIGH (ref 65–99)
Glucose-Capillary: 267 mg/dL — ABNORMAL HIGH (ref 65–99)
Glucose-Capillary: 363 mg/dL — ABNORMAL HIGH (ref 65–99)
Glucose-Capillary: 98 mg/dL (ref 65–99)

## 2015-11-25 LAB — PROTIME-INR
INR: 2.38 — AB (ref 0.00–1.49)
PROTHROMBIN TIME: 25.7 s — AB (ref 11.6–15.2)

## 2015-11-25 MED ORDER — ENOXAPARIN SODIUM 40 MG/0.4ML ~~LOC~~ SOLN
40.0000 mg | SUBCUTANEOUS | Status: AC
  Administered 2015-11-25: 40 mg via SUBCUTANEOUS
  Filled 2015-11-25: qty 0.4

## 2015-11-25 MED ORDER — INSULIN ASPART 100 UNIT/ML FLEXPEN: 0.0000 [IU] | PEN_INJECTOR | Freq: Three times a day (TID) | SUBCUTANEOUS | Status: DC

## 2015-11-25 MED ORDER — INSULIN DETEMIR 100 UNIT/ML FLEXPEN: 18.0000 [IU] | PEN_INJECTOR | Freq: Every day | SUBCUTANEOUS | Status: DC

## 2015-11-25 MED ORDER — WARFARIN SODIUM 5 MG PO TABS
7.5000 mg | ORAL_TABLET | Freq: Once | ORAL | Status: AC
  Administered 2015-11-25: 7.5 mg via ORAL
  Filled 2015-11-25: qty 1

## 2015-11-25 NOTE — Discharge Summary (Addendum)
Physician Discharge Summary  Madison [name] JOI:[id number] DOB: Sep 28, 1961 DOA: 11/22/2015  PCP: [name], NP  Admit date: 11/22/2015 Discharge date: 11/25/2015  Time spent: Greater than 30 minutes  Recommendations for Outpatient Follow-up:  1. Cori Nature conservation officer, NP/PCP in 5 days with repeat labs (CBC & BMP). 2. Dr. Charlton Haws, Cardiology in 1 week 3. Dr. Romero Belling, Endocrinology in 1 week 4. Home health RN, to draw labs (PT & INR) on 11/26/15 and forward results to [facility] heart care weekend coverage or Coumadin clinic for Coumadin dose adjustment.  Discharge Diagnoses:  Principal Problem:   DKA (diabetic ketoacidoses) (HCC) Active Problems:   CAD (coronary artery disease)   Hyperlipidemia   Rheumatoid arthritis (HCC)   Chronic anticoagulation   S/P aortic valve replacement   Essential hypertension   Hyperkalemia   Acute kidney injury (HCC)   DKA, type 1 (HCC)   Constipation   Diabetic ketoacidosis without coma associated with type 1 diabetes mellitus (HCC)   Discharge Condition: Improved & Stable  Diet recommendation: Heart healthy and diabetic diet.  Filed Weights   11/22/15 2309  Weight: 66.679 kg (147 lb)    History of present illness:  55 year old female patient with history of DM 2, chronic combined systolic and diastolic CHF with last EF 45-50 percent in September 2016, CAD status post CABG and PCI, aortic valve replacement on chronic Coumadin, presented to the [facility] ED on 11/23/15 with complaints of elevated blood sugars, nausea and vomiting. Her symptoms started after she went out to eat with her sister for lunch at a [place] the day prior to admission. She claims compliance with diabetic medications and diet. She is on only long-acting insulin/Humulin N 32 units subcutaneous daily. Her CBGs at home apparently range in the 150s-200s and she usually checks her CBGs 4 times a day. On the evening of admission, her glucometer read "high". In the ED,  blood glucose was 766, anion gap 23, urine microscopy was positive for ketones. She was admitted for evaluation and management of DKA.  Hospital Course:   DKA in DM 2, not at goal - Patient follows with Dr. 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 First diagnosed 50 and has been on insulin's since. - As per review of his records, she was on insulin pump treatment until CVAs prevented her from using the device in early 2014, in early 2015 she was changed to a simple insulin schedule, due to poor results with multiple daily injections. In 2015, she change from Levemir up NPH due to the pattern of her CBG's. She has been on NPH 32 units in AM's prior to admission - Admitted with blood glucose of 766 and anion gap of 23. - Treated per DKA protocol with aggressive IV fluids, insulin drip and frequent BMP checks. After DKA had resolved, she was transitioned to Levemir and SSI. She had a hypoglycemic episode early 1/5 AM which was most likely related to 2 doses of Levemir that she got on 11/23/15. Current CBGs ranging mostly in the 200s on Levemir 15 units daily at bedtime, NovoLog SSI and NovoLog 4 units 3 times a day with meals. - Hemoglobin A1c: 12.3 on 11/23/15 suggests very poor outpatient control. - Patient will be discharged on Levemir pen 18 units subcutaneously daily at bedtime and NovoLog sensitive SSI. Patient is quite intelligent and monitors her CBGs closely 4 times a day. She has been advised to maintain log and follow-up with  her endocrinologist regarding further management.  Nausea and vomiting - May be related to DKA versus acute viral GI. Resolved. - Although urine microscopy showed a few bacteria and some WBCs, nitrite was negative, urine culture suggests poor collection and patient is asymptomatic of dysuria, urinary frequency or fever. No antibiotics initiated.  Acute kidney injury - Admitted with creatinine of 1.27.  Prerenal from dehydration. Resolved.  Chronic combined systolic and diastolic CHF - Last EF 45-50 percent with grade 2 diastolic dysfunction 07/2015. Compensated. Not on diuretics at home.  Status post aortic valve replacement on chronic Coumadin - INR was subtherapeutic on admission at 1.4. Unclear reason. She was placed on Lovenox bridging and Coumadin per pharmacy. INR has gradually increased to 2.38. This is almost therapeutic (goal 2.5-3.5). Discussed with patient's primary cardiologist who does not recommend Lovenox bridging at discharge. Discussed with pharmacy and patient received therapeutic doses i.e. 1.5 mg/kg Lovenox dose prior to discharge. Home health RN to draw labs (PT & INR) on 11/26/15 and Coumadin dose to be adjusted by her outpatient physician team.  Leukocytosis - resolved  Anemia - stable  Hyperkalemia - resolved  Hyponatremia - Secondary to dehydration and hyperglycemia. Resolved.  CAD status post CABG - Patient was on aspirin but not Plavix PTA. Continue aspirin.  Essential hypertension - Patient was not on any medications prior to admission. Controlled.  Hyperlipidemia - Stable  Rheumatoid Arthritis - does not take Rheumatrex at home  Prior CVA - Continue Coumadin anticoagulation and low-dose aspirin.  Constipation - Patient had a large BM on 1/4 after an enema.    Consultations:  None  Procedures:  None    Discharge Exam:  Complaints:  Patient states that she feels much better. Denies complaints. Anxious to go home because of the upcoming winter storm/snow. Denies chest pain or dyspnea.  Filed Vitals:   11/24/15 0605 11/24/15 1406 11/24/15 2117 11/25/15 0411  BP: 110/45 121/51 120/46 117/55  Pulse: 78 63 71 64  Temp: 98.2 F (36.8 C) 98.9 F (37.2 C) 98.8 F (37.1 C) 98.4 F (36.9 C)  TempSrc: Oral Oral Oral Oral  Resp: 16 16 18 18   Height:      Weight:      SpO2: 99% 100% 98% 99%    General exam: pleasant middle-aged female  sitting up comfortably in bed.   Respiratory system: Clear. No increased work of breathing. Cardiovascular system: S1 & S2 heard, RRR. No JVD, murmurs, gallops, clicks or pedal edema. Telemetry: Sinus rhythm.  Gastrointestinal system: Abdomen is nondistended, soft and nontender. Normal bowel sounds heard. Central nervous system: Alert and oriented. No focal neurological deficits. Extremities: Symmetric 5 x 5 power.  Discharge Instructions      Discharge Instructions    AMB Referral to [facility] Care Management    Complete by:  As directed   Please assign to [facility] RNCM for transition of care and DM management. To discharge home today 11/25/15. Please call with questions. Thanks. 01/23/16, MSN-Ed, [facility] Liaison-[phone]  Reason for consult:  Please assign to [facility] RNCM  Expected date of contact:  1-3 days (reserved for hospital discharges)     Call MD for:  difficulty breathing, headache or visual disturbances    Complete by:  As directed      Call MD for:  extreme fatigue    Complete by:  As directed      Call MD for:  persistant dizziness or light-headedness    Complete by:  As  directed      Call MD for:  persistant nausea and vomiting    Complete by:  As directed      Call MD for:  severe uncontrolled pain    Complete by:  As directed      Call MD for:  temperature >100.4    Complete by:  As directed      Diet - low sodium heart healthy    Complete by:  As directed      Diet Carb Modified    Complete by:  As directed      Increase activity slowly    Complete by:  As directed             Medication List    STOP taking these medications        amoxicillin 500 MG capsule  Commonly known as:  AMOXIL     baclofen 10 MG tablet  Commonly known as:  LIORESAL     clopidogrel 75 MG tablet  Commonly known as:  PLAVIX     enoxaparin 80 MG/0.8ML injection  Commonly known as:  LOVENOX     Insulin NPH (Human) (Isophane) 100 UNIT/ML Kiwkpen   Commonly known as:  HUMULIN N     LANTUS SOLOSTAR 100 UNIT/ML Solostar Pen  Generic drug:  Insulin Glargine     RHEUMATREX 2.5 MG tablet  Generic drug:  methotrexate     traZODone 50 MG tablet  Commonly known as:  DESYREL      TAKE these medications        acetaminophen 325 MG tablet  Commonly known as:  TYLENOL  Take 2 tablets (650 mg total) by mouth every 6 (six) hours as needed for mild pain (temp > 101.5).     aspirin 81 MG tablet  Take 1 tablet (81 mg total) by mouth daily.     escitalopram 20 MG tablet  Commonly known as:  LEXAPRO  Take 1 tablet (20 mg total) by mouth daily.     insulin aspart 100 UNIT/ML FlexPen  Commonly known as:  NOVOLOG FLEXPEN  Inject 0-9 Units into the skin 3 (three) times daily with meals. CBG < 70: eat or drink something and recheck, CBG 70 - 120: 0 units CBG 121 - 150: 1 unit CBG 151 - 200: 2 units CBG 201 - 250: 3 units CBG 251 - 300: 5 units CBG 301 - 350: 7 units CBG 351 - 400: 9 units CBG > 400: call MD.     Insulin Detemir 100 UNIT/ML Pen  Commonly known as:  LEVEMIR  Inject 18 Units into the skin daily at 10 pm.     omeprazole 40 MG capsule  Commonly known as:  PRILOSEC  TAKE 1 CAPSULE (40 MG TOTAL) BY MOUTH DAILY.     pravastatin 20 MG tablet  Commonly known as:  PRAVACHOL  Take 1 tablet (20 mg total) by mouth daily.     solifenacin 5 MG tablet  Commonly known as:  VESICARE  Take 1 tablet (5 mg total) by mouth daily.     warfarin 5 MG tablet  Commonly known as:  COUMADIN  Take 7.5 mg by mouth daily.       Follow-up Information    Follow up with [name], NP. Schedule an appointment as soon as possible for a visit in 5 days.   Specialty:  Family Medicine   Why:  To be seen with repeat labs (CBC & BMP)   Contact information:  [address] [place] [address] [phone]       Follow up with Charlton Haws, MD. Schedule an appointment as soon as possible for a visit in 1 week.   Specialty:  Cardiology    Contact information:   [address] [place] [address] [phone]       Follow up with Romero Belling, MD. Schedule an appointment as soon as possible for a visit in 1 week.   Specialty:  Endocrinology   Contact information:   [address] [place] [address] [phone]        The results of significant diagnostics from this hospitalization (including imaging, microbiology, ancillary and laboratory) are listed below for reference.    Significant Diagnostic Studies: Dg Abd Acute W/chest  11/23/2015  CLINICAL DATA:  Leukocytosis and emesis EXAM: DG ABDOMEN ACUTE W/ 1V CHEST COMPARISON:  09/16/2015 FINDINGS: Normal heart size and mediastinal contours. The patient is status post median sternotomy for aortic valve replacement. There is no edema, consolidation, effusion, or pneumothorax. Prominent volume of formed stool distending multiple colonic segments. No evidence of bowel obstruction or perforation. No concerning intra-abdominal mass effect or calcification. Two densely calcified masses in the pelvis, usually calcified fibroids, the larger measuring 44 mm. Calcification is chronic from 2013 based on report. IMPRESSION: 1. Large volume stool suggesting constipation. Negative for bowel obstruction. 2. No evidence of acute cardiopulmonary disease. Electronically Signed   By: Marnee Spring M.D.   On: 11/23/2015 09:33    Microbiology: Recent Results (from the past 240 hour(s))  Culture, Urine     Status: None   Collection Time: 11/23/15 10:39 AM  Result Value Ref Range Status   Specimen Description URINE, CLEAN CATCH  Final   Special Requests NONE  Final   Culture   Final    MULTIPLE SPECIES PRESENT, SUGGEST RECOLLECTION Performed at [facility]    Report Status 11/24/2015 FINAL  Final  MRSA PCR Screening     Status: None   Collection Time: 11/24/15  9:20 AM  Result Value Ref Range Status   MRSA by PCR NEGATIVE NEGATIVE Final     Comment:        The GeneXpert MRSA Assay (FDA approved for NASAL specimens only), is one component of a comprehensive MRSA colonization surveillance program. It is not intended to diagnose MRSA infection nor to guide or monitor treatment for MRSA infections.      Labs: Basic Metabolic Panel:  Recent Labs Lab 11/23/15 0023 11/23/15 1114 11/24/15 0457 11/25/15 0400  NA 130* 137 142 140  K 5.5* 3.8 3.7 4.1  CL 91* 103 113* 105  CO2 16* 25 24 26   GLUCOSE 766* 164* 60* 306*  BUN 25* 17 10 8   CREATININE 1.27* 0.84 0.81 0.76  CALCIUM 9.6 8.8* 8.3* 8.6*  MG  --  1.7  --   --    Liver Function Tests:  Recent Labs Lab 11/23/15 1114  AST 13*  ALT 10*  ALKPHOS 70  BILITOT 0.6  PROT 6.2*  ALBUMIN 3.3*   No results for input(s): LIPASE, AMYLASE in the last 168 hours. No results for input(s): AMMONIA in the last 168 hours. CBC:  Recent Labs Lab 11/23/15 0023 11/23/15 1114 11/24/15 0457 11/25/15 0400  WBC 17.8* 16.1* 10.5 6.3  NEUTROABS  --  12.3* 5.6  --   HGB 10.8* 9.5* 9.5* 9.8*  HCT 33.9* 30.1* 29.6* 30.9*  MCV 78.8 78.4 78.5 80.1  PLT  313 273 264 250   Cardiac Enzymes:  Recent Labs Lab 11/23/15 0140  TROPONINI <0.03   BNP: BNP (last 3 results)  Recent Labs  07/30/15 1400  BNP 398.4*    ProBNP (last 3 results) No results for input(s): PROBNP in the last 8760 hours.  CBG:  Recent Labs Lab 11/24/15 2017 11/24/15 2357 11/25/15 0418 11/25/15 0731 11/25/15 1150  GLUCAP 223* 363* 267* 204* 98      Signed:  Ziyad Dyar, MD, FACP, FHM. [facility] Pager [phone]  If 7PM-7AM, please contact night-coverage [email or web address] Password [email or web address] 11/25/2015, 3:06 PM

## 2015-11-25 NOTE — Discharge Instructions (Signed)
Diabetic Ketoacidosis °Diabetic ketoacidosis is a life-threatening complication of diabetes. If it is not treated, it can cause severe dehydration and organ damage and can lead to a coma or death. °CAUSES °This condition develops when there is not enough of the hormone insulin in the body. Insulin helps the body to break down sugar for energy. Without insulin, the body cannot break down sugar, so it breaks down fats instead. This leads to the production of acids that are called ketones. Ketones are poisonous at high levels. °This condition can be triggered by: °· Stress on the body that is brought on by an illness. °· Medicines that raise blood glucose levels. °· Not taking diabetes medicine. °SYMPTOMS °Symptoms of this condition include: °· Fatigue. °· Weight loss. °· Excessive thirst. °· Light-headedness. °· Fruity or sweet-smelling breath. °· Excessive urination. °· Vision changes. °· Confusion or irritability. °· Nausea. °· Vomiting. °· Rapid breathing. °· Abdominal pain. °· Feeling flushed. °DIAGNOSIS °This condition is diagnosed based on a medical history, a physical exam, and blood tests. You may also have a urine test that checks for ketones. °TREATMENT °This condition may be treated with: °· Fluid replacement. This may be done to correct dehydration. °· Insulin injections. These may be given through the skin or through an IV tube. °· Electrolyte replacement. Electrolytes, such as potassium and sodium, may be given in pill form or through an IV tube. °· Antibiotic medicines. These may be prescribed if your condition was caused by an infection. °HOME CARE INSTRUCTIONS °Eating and Drinking °· Drink enough fluids to keep your urine clear or pale yellow. °· If you cannot eat, alternate between drinking fluids with sugar (such as juice) and salty fluids (such as broth or bouillon). °· If you can eat, follow your usual diet and drink sugar-free liquids, such as water. °Other Instructions °· Take insulin as  directed by your health care provider. Do not skip insulin injections. Do not use expired insulin. °· If your blood sugar is over 240 mg/dL, monitor your urine ketones every 4-6 hours. °· If you were prescribed an antibiotic medicine, finish all of it even if you start to feel better. °· Rest and exercise only as directed by your health care provider. °· If you get sick, call your health care provider and begin treatment quickly. Your body often needs extra insulin to fight an illness. °· Check your blood glucose levels regularly. If your blood glucose is high, drink plenty of fluids. This helps to flush out ketones. °SEEK MEDICAL CARE IF: °· Your blood glucose level is too high or too low. °· You have ketones in your urine. °· You have a fever. °· You cannot eat. °· You cannot tolerate fluids. °· You have been vomiting for more than 2 hours. °· You continue to have symptoms of this condition. °· You develop new symptoms. °SEEK IMMEDIATE MEDICAL CARE IF: °· Your blood glucose levels continue to be high (elevated). °· Your monitor reads "high" even when you are taking insulin. °· You faint. °· You have chest pain. °· You have trouble breathing. °· You have a sudden, severe headache. °· You have sudden weakness in one arm or one leg. °· You have sudden trouble speaking or swallowing. °· You have vomiting or diarrhea that gets worse after 3 hours. °· You feel severely fatigued. °· You have trouble thinking. °· You have abdominal pain. °· You are severely dehydrated. Symptoms of severe dehydration include: °¨ Extreme thirst. °¨ Dry mouth. °¨ Blue lips. °¨   Cold hands and feet. °¨ Rapid breathing. °  °This information is not intended to replace advice given to you by your health care provider. Make sure you discuss any questions you have with your health care provider. °  °Document Released: 11/02/2000 Document Revised: 03/22/2015 Document Reviewed: 10/13/2014 °Elsevier Interactive Patient Education ©2016 Elsevier  Inc. ° °

## 2015-11-25 NOTE — Consult Note (Signed)
   [facility] CM Inpatient Consult   11/25/2015  DANIRA NYLANDER 11-25-1960 [id number]   [facility] Care Management referral received. Went to bedside to speak with patient about signing back up with [facility] Care Management services. She has been active in the past and was since discharged. Discussed [facility] Care Management follow up again with patient and she endorses she could benefit from enrolling with [facility] Care Management again. States she has a roommate that stays with her at night. Her sister takes her to her MD appointments. She did acknowledge that she will have trouble affording medications if she is on anything new until she gets her check on Monday. She endorses she has Medicaid as well. She states she does still have insulin at home to take however. Patient also states she is active with [facility] and they draw her coumadin levels. Explained to patient that [facility] Care Management will not interfere or replace services provided by home health. Confirmed best contact number as [phone]. She also states she has an aide that stays with her 2.5 hers a day. Will request for patient to be assigned to [facility] RNCM again. Inpatient RNCM aware of the above.   Raiford Noble, MSN-Ed, RN,BSN [facility] Liaison [phone]

## 2015-11-25 NOTE — Progress Notes (Addendum)
ANTICOAGULATION CONSULT NOTE  Pharmacy Consult for warfarin Indication: aortic valve replacement  Allergies  Allergen Reactions  . Other Other (See Comments)    Can burn skin if left on too long.  . Celebrex [Celecoxib] Rash  . Detrol [Tolterodine] Hives    Patient Measurements: Height: 5\' 3"  (160 cm) Weight: 147 lb (66.679 kg) IBW/kg (Calculated) : 52.4  Vital Signs: Temp: 98.4 F (36.9 C) (01/06 0411) Temp Source: Oral (01/06 0411) BP: 117/55 mmHg (01/06 0411) Pulse Rate: 64 (01/06 0411)  Labs:  Recent Labs  11/23/15 0140 11/23/15 1114 11/23/15 1331 11/24/15 0457 11/25/15 0400  HGB  --  9.5*  --  9.5* 9.8*  HCT  --  30.1*  --  29.6* 30.9*  PLT  --  273  --  264 250  LABPROT  --   --  16.7* 20.5* 25.7*  INR  --   --  1.35 1.76* 2.38*  CREATININE  --  0.84  --  0.81 0.76  TROPONINI <0.03  --   --   --   --     Estimated Creatinine Clearance: 73.7 mL/min (by C-G formula based on Cr of 0.76).   Medical History: Past Medical History  Diagnosis Date  . CAD (coronary artery disease)   . Obesity   . Hypercholesteremia   . HTN (hypertension)   . Dyslipidemia   . Aortic stenosis   . Thrombophlebitis   . Heart murmur   . Myocardial infarction (HCC) 12/2014  . Pneumonia 11/2014; 12/2014  . GERD (gastroesophageal reflux disease)   . Stroke syndrome [facility]) 1995; 2001    "when my son was born; problems w/speech and L hand since then" (01/19/2015)  . Anxiety   . Depression   . Allergy   . CHF (congestive heart failure) (HCC)   . Rheumatoid arthritis(714.0)     "hands" (07/01/2015)  . Diabetes (HCC) dx'd 1982    "went straight to insulin"   Assessment: 54 YOF on warfarin for aortic valve replacement.  INR subtherapeutic at admission.  Patient also started on therapeutic enoxaparin at admission.  NOTE: she was no longer on enoxaparin prior to admission.   Home warfarin dose: 7.5mg  daily (last dose 1/3)  Today's labs, 11/25/2015:  INR subtherapeutic, but rising to  goal   CBC: stable, anemia noted  SCr WNL  Drug interactions: on ASA chronically.  Plavix was discontinued.   Eating 50-100% cardiac diet   Goal of Therapy:  INR 2.5 - 3.5 Monitor platelets by anticoagulation protocol: Yes   Plan:   Warfarin 7.5mg  PO x 1 tonight  Enoxaparin 70mg  SQ q12h - dose appropriate for wt and renal fx  Daily INR  Enoxaparin until INR therapeutic  CBC at least q72h  01/23/2016, PharmD, BCPS Pager: [phone] 11/25/2015 9:02 AM

## 2015-11-25 NOTE — Progress Notes (Signed)
Pt is active with [facility] and will continue with at discharge with them.  Referral called to in house rep.

## 2015-11-26 DIAGNOSIS — I6330 Cerebral infarction due to thrombosis of unspecified cerebral artery: Secondary | ICD-10-CM | POA: Diagnosis not present

## 2015-11-26 DIAGNOSIS — I10 Essential (primary) hypertension: Secondary | ICD-10-CM | POA: Diagnosis not present

## 2015-11-26 DIAGNOSIS — E139 Other specified diabetes mellitus without complications: Secondary | ICD-10-CM | POA: Diagnosis not present

## 2015-11-27 DIAGNOSIS — M0600 Rheumatoid arthritis without rheumatoid factor, unspecified site: Secondary | ICD-10-CM | POA: Diagnosis not present

## 2015-11-27 DIAGNOSIS — R1314 Dysphagia, pharyngoesophageal phase: Secondary | ICD-10-CM | POA: Diagnosis not present

## 2015-11-27 DIAGNOSIS — I350 Nonrheumatic aortic (valve) stenosis: Secondary | ICD-10-CM | POA: Diagnosis not present

## 2015-11-27 DIAGNOSIS — D649 Anemia, unspecified: Secondary | ICD-10-CM | POA: Diagnosis not present

## 2015-11-27 DIAGNOSIS — I5042 Chronic combined systolic (congestive) and diastolic (congestive) heart failure: Secondary | ICD-10-CM | POA: Diagnosis not present

## 2015-11-27 DIAGNOSIS — I110 Hypertensive heart disease with heart failure: Secondary | ICD-10-CM | POA: Diagnosis not present

## 2015-11-27 DIAGNOSIS — I2510 Atherosclerotic heart disease of native coronary artery without angina pectoris: Secondary | ICD-10-CM | POA: Diagnosis not present

## 2015-11-27 DIAGNOSIS — E785 Hyperlipidemia, unspecified: Secondary | ICD-10-CM | POA: Diagnosis not present

## 2015-11-27 DIAGNOSIS — E139 Other specified diabetes mellitus without complications: Secondary | ICD-10-CM | POA: Diagnosis not present

## 2015-11-27 DIAGNOSIS — I10 Essential (primary) hypertension: Secondary | ICD-10-CM | POA: Diagnosis not present

## 2015-11-27 DIAGNOSIS — E109 Type 1 diabetes mellitus without complications: Secondary | ICD-10-CM | POA: Diagnosis not present

## 2015-11-27 DIAGNOSIS — I6330 Cerebral infarction due to thrombosis of unspecified cerebral artery: Secondary | ICD-10-CM | POA: Diagnosis not present

## 2015-11-28 ENCOUNTER — Ambulatory Visit (INDEPENDENT_AMBULATORY_CARE_PROVIDER_SITE_OTHER): Payer: Commercial Managed Care - HMO | Admitting: General Practice

## 2015-11-28 ENCOUNTER — Other Ambulatory Visit: Payer: Self-pay

## 2015-11-28 DIAGNOSIS — Z7901 Long term (current) use of anticoagulants: Secondary | ICD-10-CM

## 2015-11-28 DIAGNOSIS — Z952 Presence of prosthetic heart valve: Secondary | ICD-10-CM

## 2015-11-28 DIAGNOSIS — I10 Essential (primary) hypertension: Secondary | ICD-10-CM | POA: Diagnosis not present

## 2015-11-28 DIAGNOSIS — E139 Other specified diabetes mellitus without complications: Secondary | ICD-10-CM | POA: Diagnosis not present

## 2015-11-28 DIAGNOSIS — I6330 Cerebral infarction due to thrombosis of unspecified cerebral artery: Secondary | ICD-10-CM | POA: Diagnosis not present

## 2015-11-28 DIAGNOSIS — Z954 Presence of other heart-valve replacement: Secondary | ICD-10-CM

## 2015-11-28 LAB — GLUCOSE, CAPILLARY: GLUCOSE-CAPILLARY: 28 mg/dL — AB (ref 65–99)

## 2015-11-28 LAB — POCT INR: INR: 3.4

## 2015-11-28 NOTE — Patient Outreach (Signed)
Initial telephonic contact made with patient who identified herself by providing date of birth and address.   Patient was agreeable to this transitional of care assessment.  Home visit scheduled for next week for further assessment community care coordination.

## 2015-11-28 NOTE — Progress Notes (Signed)
I agree with this plan.

## 2015-11-28 NOTE — Progress Notes (Signed)
Pre visit review using our clinic review tool, if applicable. No additional management support is needed unless otherwise documented below in the visit note. 

## 2015-11-29 DIAGNOSIS — E785 Hyperlipidemia, unspecified: Secondary | ICD-10-CM | POA: Diagnosis not present

## 2015-11-29 DIAGNOSIS — D649 Anemia, unspecified: Secondary | ICD-10-CM | POA: Diagnosis not present

## 2015-11-29 DIAGNOSIS — I110 Hypertensive heart disease with heart failure: Secondary | ICD-10-CM | POA: Diagnosis not present

## 2015-11-29 DIAGNOSIS — I5042 Chronic combined systolic (congestive) and diastolic (congestive) heart failure: Secondary | ICD-10-CM | POA: Diagnosis not present

## 2015-11-29 DIAGNOSIS — I2510 Atherosclerotic heart disease of native coronary artery without angina pectoris: Secondary | ICD-10-CM | POA: Diagnosis not present

## 2015-11-29 DIAGNOSIS — I6330 Cerebral infarction due to thrombosis of unspecified cerebral artery: Secondary | ICD-10-CM | POA: Diagnosis not present

## 2015-11-29 DIAGNOSIS — E109 Type 1 diabetes mellitus without complications: Secondary | ICD-10-CM | POA: Diagnosis not present

## 2015-11-29 DIAGNOSIS — E139 Other specified diabetes mellitus without complications: Secondary | ICD-10-CM | POA: Diagnosis not present

## 2015-11-29 DIAGNOSIS — R1314 Dysphagia, pharyngoesophageal phase: Secondary | ICD-10-CM | POA: Diagnosis not present

## 2015-11-29 DIAGNOSIS — I10 Essential (primary) hypertension: Secondary | ICD-10-CM | POA: Diagnosis not present

## 2015-11-29 DIAGNOSIS — I350 Nonrheumatic aortic (valve) stenosis: Secondary | ICD-10-CM | POA: Diagnosis not present

## 2015-11-29 DIAGNOSIS — M0600 Rheumatoid arthritis without rheumatoid factor, unspecified site: Secondary | ICD-10-CM | POA: Diagnosis not present

## 2015-11-30 DIAGNOSIS — I10 Essential (primary) hypertension: Secondary | ICD-10-CM | POA: Diagnosis not present

## 2015-11-30 DIAGNOSIS — E139 Other specified diabetes mellitus without complications: Secondary | ICD-10-CM | POA: Diagnosis not present

## 2015-11-30 DIAGNOSIS — I6330 Cerebral infarction due to thrombosis of unspecified cerebral artery: Secondary | ICD-10-CM | POA: Diagnosis not present

## 2015-12-01 DIAGNOSIS — I6330 Cerebral infarction due to thrombosis of unspecified cerebral artery: Secondary | ICD-10-CM | POA: Diagnosis not present

## 2015-12-01 DIAGNOSIS — E139 Other specified diabetes mellitus without complications: Secondary | ICD-10-CM | POA: Diagnosis not present

## 2015-12-01 DIAGNOSIS — I10 Essential (primary) hypertension: Secondary | ICD-10-CM | POA: Diagnosis not present

## 2015-12-02 DIAGNOSIS — I6330 Cerebral infarction due to thrombosis of unspecified cerebral artery: Secondary | ICD-10-CM | POA: Diagnosis not present

## 2015-12-02 DIAGNOSIS — E139 Other specified diabetes mellitus without complications: Secondary | ICD-10-CM | POA: Diagnosis not present

## 2015-12-02 DIAGNOSIS — I10 Essential (primary) hypertension: Secondary | ICD-10-CM | POA: Diagnosis not present

## 2015-12-03 DIAGNOSIS — I6330 Cerebral infarction due to thrombosis of unspecified cerebral artery: Secondary | ICD-10-CM | POA: Diagnosis not present

## 2015-12-03 DIAGNOSIS — E139 Other specified diabetes mellitus without complications: Secondary | ICD-10-CM | POA: Diagnosis not present

## 2015-12-03 DIAGNOSIS — I10 Essential (primary) hypertension: Secondary | ICD-10-CM | POA: Diagnosis not present

## 2015-12-04 DIAGNOSIS — E139 Other specified diabetes mellitus without complications: Secondary | ICD-10-CM | POA: Diagnosis not present

## 2015-12-04 DIAGNOSIS — I10 Essential (primary) hypertension: Secondary | ICD-10-CM | POA: Diagnosis not present

## 2015-12-04 DIAGNOSIS — I6330 Cerebral infarction due to thrombosis of unspecified cerebral artery: Secondary | ICD-10-CM | POA: Diagnosis not present

## 2015-12-05 DIAGNOSIS — I10 Essential (primary) hypertension: Secondary | ICD-10-CM | POA: Diagnosis not present

## 2015-12-05 DIAGNOSIS — E139 Other specified diabetes mellitus without complications: Secondary | ICD-10-CM | POA: Diagnosis not present

## 2015-12-05 DIAGNOSIS — I6330 Cerebral infarction due to thrombosis of unspecified cerebral artery: Secondary | ICD-10-CM | POA: Diagnosis not present

## 2015-12-06 DIAGNOSIS — I10 Essential (primary) hypertension: Secondary | ICD-10-CM | POA: Diagnosis not present

## 2015-12-06 DIAGNOSIS — E109 Type 1 diabetes mellitus without complications: Secondary | ICD-10-CM | POA: Diagnosis not present

## 2015-12-06 DIAGNOSIS — R1314 Dysphagia, pharyngoesophageal phase: Secondary | ICD-10-CM | POA: Diagnosis not present

## 2015-12-06 DIAGNOSIS — M0600 Rheumatoid arthritis without rheumatoid factor, unspecified site: Secondary | ICD-10-CM | POA: Diagnosis not present

## 2015-12-06 DIAGNOSIS — I6330 Cerebral infarction due to thrombosis of unspecified cerebral artery: Secondary | ICD-10-CM | POA: Diagnosis not present

## 2015-12-06 DIAGNOSIS — I2510 Atherosclerotic heart disease of native coronary artery without angina pectoris: Secondary | ICD-10-CM | POA: Diagnosis not present

## 2015-12-06 DIAGNOSIS — E785 Hyperlipidemia, unspecified: Secondary | ICD-10-CM | POA: Diagnosis not present

## 2015-12-06 DIAGNOSIS — I5042 Chronic combined systolic (congestive) and diastolic (congestive) heart failure: Secondary | ICD-10-CM | POA: Diagnosis not present

## 2015-12-06 DIAGNOSIS — I350 Nonrheumatic aortic (valve) stenosis: Secondary | ICD-10-CM | POA: Diagnosis not present

## 2015-12-06 DIAGNOSIS — D649 Anemia, unspecified: Secondary | ICD-10-CM | POA: Diagnosis not present

## 2015-12-06 DIAGNOSIS — E139 Other specified diabetes mellitus without complications: Secondary | ICD-10-CM | POA: Diagnosis not present

## 2015-12-06 DIAGNOSIS — I110 Hypertensive heart disease with heart failure: Secondary | ICD-10-CM | POA: Diagnosis not present

## 2015-12-07 ENCOUNTER — Other Ambulatory Visit: Payer: Self-pay

## 2015-12-07 DIAGNOSIS — E139 Other specified diabetes mellitus without complications: Secondary | ICD-10-CM | POA: Diagnosis not present

## 2015-12-07 DIAGNOSIS — I10 Essential (primary) hypertension: Secondary | ICD-10-CM | POA: Diagnosis not present

## 2015-12-07 DIAGNOSIS — I6330 Cerebral infarction due to thrombosis of unspecified cerebral artery: Secondary | ICD-10-CM | POA: Diagnosis not present

## 2015-12-07 NOTE — Patient Outreach (Signed)
[facility]) Care Management  12/07/2015  [name] [birth year] [id number]   Home visit with patient to discuss need for community resources. Patient has now moved back in her apartment after recovering after last hospitalization at her sister's house. Patient does to have a scale, she was using the scale at her sister's house, however, she was not allowed to bring the scales with her.    This RNCM informed patient about resource from [facility] to provide scales to those [facility] patients who have a diagnosis of Heart Failure, unable to afford scales.  Patient  stated she would appreciate the scales and would use them to weigh and she would record her weights.    Plan: Deliver scales to patient for weigh daily, Home visit in 2 weeks to further educate on heart failure.

## 2015-12-08 DIAGNOSIS — I6330 Cerebral infarction due to thrombosis of unspecified cerebral artery: Secondary | ICD-10-CM | POA: Diagnosis not present

## 2015-12-08 DIAGNOSIS — I10 Essential (primary) hypertension: Secondary | ICD-10-CM | POA: Diagnosis not present

## 2015-12-08 DIAGNOSIS — E139 Other specified diabetes mellitus without complications: Secondary | ICD-10-CM | POA: Diagnosis not present

## 2015-12-09 DIAGNOSIS — M0600 Rheumatoid arthritis without rheumatoid factor, unspecified site: Secondary | ICD-10-CM | POA: Diagnosis not present

## 2015-12-09 DIAGNOSIS — R1314 Dysphagia, pharyngoesophageal phase: Secondary | ICD-10-CM | POA: Diagnosis not present

## 2015-12-09 DIAGNOSIS — I2510 Atherosclerotic heart disease of native coronary artery without angina pectoris: Secondary | ICD-10-CM | POA: Diagnosis not present

## 2015-12-09 DIAGNOSIS — E785 Hyperlipidemia, unspecified: Secondary | ICD-10-CM | POA: Diagnosis not present

## 2015-12-09 DIAGNOSIS — I5042 Chronic combined systolic (congestive) and diastolic (congestive) heart failure: Secondary | ICD-10-CM | POA: Diagnosis not present

## 2015-12-09 DIAGNOSIS — I350 Nonrheumatic aortic (valve) stenosis: Secondary | ICD-10-CM | POA: Diagnosis not present

## 2015-12-09 DIAGNOSIS — I110 Hypertensive heart disease with heart failure: Secondary | ICD-10-CM | POA: Diagnosis not present

## 2015-12-09 DIAGNOSIS — E109 Type 1 diabetes mellitus without complications: Secondary | ICD-10-CM | POA: Diagnosis not present

## 2015-12-09 DIAGNOSIS — I10 Essential (primary) hypertension: Secondary | ICD-10-CM | POA: Diagnosis not present

## 2015-12-09 DIAGNOSIS — I6330 Cerebral infarction due to thrombosis of unspecified cerebral artery: Secondary | ICD-10-CM | POA: Diagnosis not present

## 2015-12-09 DIAGNOSIS — E139 Other specified diabetes mellitus without complications: Secondary | ICD-10-CM | POA: Diagnosis not present

## 2015-12-09 DIAGNOSIS — D649 Anemia, unspecified: Secondary | ICD-10-CM | POA: Diagnosis not present

## 2015-12-10 DIAGNOSIS — I10 Essential (primary) hypertension: Secondary | ICD-10-CM | POA: Diagnosis not present

## 2015-12-10 DIAGNOSIS — I6330 Cerebral infarction due to thrombosis of unspecified cerebral artery: Secondary | ICD-10-CM | POA: Diagnosis not present

## 2015-12-10 DIAGNOSIS — E139 Other specified diabetes mellitus without complications: Secondary | ICD-10-CM | POA: Diagnosis not present

## 2015-12-11 ENCOUNTER — Other Ambulatory Visit: Payer: Self-pay

## 2015-12-11 DIAGNOSIS — E139 Other specified diabetes mellitus without complications: Secondary | ICD-10-CM | POA: Diagnosis not present

## 2015-12-11 DIAGNOSIS — I6330 Cerebral infarction due to thrombosis of unspecified cerebral artery: Secondary | ICD-10-CM | POA: Diagnosis not present

## 2015-12-11 DIAGNOSIS — I10 Essential (primary) hypertension: Secondary | ICD-10-CM | POA: Diagnosis not present

## 2015-12-12 ENCOUNTER — Ambulatory Visit: Payer: Commercial Managed Care - HMO | Admitting: Adult Health

## 2015-12-12 ENCOUNTER — Encounter: Payer: Self-pay | Admitting: Endocrinology

## 2015-12-12 ENCOUNTER — Ambulatory Visit (INDEPENDENT_AMBULATORY_CARE_PROVIDER_SITE_OTHER): Payer: Commercial Managed Care - HMO | Admitting: Endocrinology

## 2015-12-12 ENCOUNTER — Telehealth: Payer: Self-pay

## 2015-12-12 VITALS — BP 127/86 | HR 107 | Temp 98.2°F | Ht 63.0 in | Wt 150.0 lb

## 2015-12-12 DIAGNOSIS — I5042 Chronic combined systolic (congestive) and diastolic (congestive) heart failure: Secondary | ICD-10-CM | POA: Diagnosis not present

## 2015-12-12 DIAGNOSIS — E1010 Type 1 diabetes mellitus with ketoacidosis without coma: Secondary | ICD-10-CM

## 2015-12-12 DIAGNOSIS — R1314 Dysphagia, pharyngoesophageal phase: Secondary | ICD-10-CM | POA: Diagnosis not present

## 2015-12-12 DIAGNOSIS — E785 Hyperlipidemia, unspecified: Secondary | ICD-10-CM | POA: Diagnosis not present

## 2015-12-12 DIAGNOSIS — E109 Type 1 diabetes mellitus without complications: Secondary | ICD-10-CM | POA: Diagnosis not present

## 2015-12-12 DIAGNOSIS — I110 Hypertensive heart disease with heart failure: Secondary | ICD-10-CM | POA: Diagnosis not present

## 2015-12-12 DIAGNOSIS — I350 Nonrheumatic aortic (valve) stenosis: Secondary | ICD-10-CM | POA: Diagnosis not present

## 2015-12-12 DIAGNOSIS — I2510 Atherosclerotic heart disease of native coronary artery without angina pectoris: Secondary | ICD-10-CM | POA: Diagnosis not present

## 2015-12-12 DIAGNOSIS — M0600 Rheumatoid arthritis without rheumatoid factor, unspecified site: Secondary | ICD-10-CM | POA: Diagnosis not present

## 2015-12-12 DIAGNOSIS — E139 Other specified diabetes mellitus without complications: Secondary | ICD-10-CM | POA: Diagnosis not present

## 2015-12-12 DIAGNOSIS — I6330 Cerebral infarction due to thrombosis of unspecified cerebral artery: Secondary | ICD-10-CM | POA: Diagnosis not present

## 2015-12-12 DIAGNOSIS — I10 Essential (primary) hypertension: Secondary | ICD-10-CM | POA: Diagnosis not present

## 2015-12-12 DIAGNOSIS — D649 Anemia, unspecified: Secondary | ICD-10-CM | POA: Diagnosis not present

## 2015-12-12 NOTE — Progress Notes (Signed)
Subjective:    Patient ID: Madison [name], female    DOB: 07/04/1961, 55 y.o.   MRN: [id number]  HPI Pt returns for f/u of diabetes mellitus: DM type: Insulin-requiring type 2 Dx'ed: 1981 Complications: polyneuropathy, retinopathy, CVA's, and CAD.   Therapy: insulin since dx.   GDM: never. DKA: last episode was in early 2017.  Severe hypoglycemia: last episode was in 2014.   Pancreatitis: never.  Other: she was on insulin pump rx until CVA's prevented her from using the device in early 2014; in early 2015, she was changed to a simple insulin schedule, due to poor results with multiple daily injections.  In 2015, she changed from QAM levemir to QAM NPH, due to am hypoglycemia and pm hyperglycemia.  Interval history: In the hospital, she was changed to qam NPH, prn novolog, and qhs levemir.  She takes 32 units of NPH qam, levemir, 18 units at bedtime, and prn novolog (averages approx 5 units total per day).  She has frequent mild hypoglycemia in the fasting state.  Past Medical History  Diagnosis Date  . CAD (coronary artery disease)   . Obesity   . Hypercholesteremia   . HTN (hypertension)   . Dyslipidemia   . Aortic stenosis   . Thrombophlebitis   . Heart murmur   . Myocardial infarction (HCC) 12/2014  . Pneumonia 11/2014; 12/2014  . GERD (gastroesophageal reflux disease)   . Stroke syndrome [facility]) 1995; 2001    "when my son was born; problems w/speech and L hand since then" (01/19/2015)  . Anxiety   . Depression   . Allergy   . CHF (congestive heart failure) (HCC)   . Rheumatoid arthritis(714.0)     "hands" (07/01/2015)  . Diabetes (HCC) dx'd 1982    "went straight to insulin"    Past Surgical History  Procedure Laterality Date  . Cesarean section  1995  . Foot fracture surgery    . Knee arthroscopy Right   . Neuroplasty / transposition median nerve at carpal tunnel    . Left heart catheterization with coronary angiogram N/A 12/08/2014    Procedure: LEFT HEART  CATHETERIZATION WITH CORONARY ANGIOGRAM;  Surgeon: Iran Ouch, MD;  Location: [facility] CATH LAB;  Service: Cardiovascular;  Laterality: N/A;  . Percutaneous coronary stent intervention (pci-s) N/A 12/09/2014    Procedure: PERCUTANEOUS CORONARY STENT INTERVENTION (PCI-S);  Surgeon: [name], MD;  Location: [facility] CATH LAB;  Service: Cardiovascular;  Laterality: N/A;  . Cardiac catheterization  12/08/2014  . Coronary angioplasty with stent placement  12/09/2014  . Aortic valve replacement (avr)/coronary artery bypass grafting (cabg)  "2014"    [name] 03/08/2014  . Fracture surgery    . Tubal ligation  1995  . Cataract extraction Left   . Coronary artery bypass graft    . Orif ankle fracture Left 07/04/2015    Procedure: OPEN REDUCTION INTERNAL FIXATION (ORIF)  TRIMAL ANKLE FRACTURE;  Surgeon: [name], MD;  Location: [facility] OR;  Service: Orthopedics;  Laterality: Left;    Social History   Social History  . Marital Status: Divorced    Spouse Name: N/A  . Number of Children: N/A  . Years of Education: N/A   Occupational History  . Not on file.   Social History Main Topics  . Smoking status: Never Smoker   . Smokeless tobacco: Never Used  . Alcohol Use: No  . Drug Use: No  . Sexual Activity: No   Other Topics Concern  . Not on  file   Social History Narrative   Divroced and lives at home.    Has two sons who live in [place] at this time. (21&8 y/o).    Has a cat    Walks a lot.       She is having great success with her diet       Working with exercises that were given to her by PT/OT.     Current Outpatient Prescriptions on File Prior to Visit  Medication Sig Dispense Refill  . acetaminophen (TYLENOL) 325 MG tablet Take 2 tablets (650 mg total) by mouth every 6 (six) hours as needed for mild pain (temp > 101.5).    Marland Kitchen aspirin 81 MG tablet Take 1 tablet (81 mg total) by mouth daily. 30 tablet   . escitalopram (LEXAPRO) 20 MG tablet Take 1 tablet (20 mg total) by mouth daily. 30  tablet 6  . omeprazole (PRILOSEC) 40 MG capsule TAKE 1 CAPSULE (40 MG TOTAL) BY MOUTH DAILY. 90 capsule 1  . pravastatin (PRAVACHOL) 20 MG tablet Take 1 tablet (20 mg total) by mouth daily. 90 tablet 0  . solifenacin (VESICARE) 5 MG tablet Take 1 tablet (5 mg total) by mouth daily. 90 tablet 2  . warfarin (COUMADIN) 5 MG tablet Take 7.5 mg by mouth daily.     No current facility-administered medications on file prior to visit.    Allergies  Allergen Reactions  . Other Other (See Comments)    Can burn skin if left on too long.  . Celebrex [Celecoxib] Rash  . Detrol [Tolterodine] Hives    Family History  Problem Relation Age of Onset  . Stroke    . Heart disease Mother   . Heart disease Sister     BP 127/86 mmHg  Pulse 107  Temp(Src) 98.2 F (36.8 C) (Oral)  Ht 5\' 3"  (1.6 m)  Wt 150 lb (68.04 kg)  BMI 26.58 kg/m2  SpO2 98%  Review of Systems Denies LOC.      Objective:   Physical Exam VITAL SIGNS:  See vs page GENERAL: no distress Pulses: dorsalis pedis intact bilat.   MSK: no deformity of the feet.  CV: no leg edema.   Skin:  no ulcer on the feet.  normal color and temp on the feet.  Old healed surgical scar at the left medial malleolus. Neuro: sensation is intact to touch on the feet, but decreased from normal.   Lab Results  Component Value Date   HGBA1C 12.3* 11/23/2015      Assessment & Plan:  The pattern of cbg's indicates she needs just NPH in am.  DKA must be caused by noncompliance, and not by he regimen of NPH qam.    Patient is advised the following: Patient Instructions  check your blood sugar twice a day.  vary the time of day when you check, between before the 3 meals, and at bedtime.  also check if you have symptoms of your blood sugar being too high or too low.  please keep a record of the readings and bring it to your next appointment here.  You can write it on any piece of paper.  please call 01/21/2016 sooner if your blood sugar goes below 70, or if  you have a lot of readings over 200.    On this type of insulin schedule, you should eat meals on a regular schedule.  If a meal is missed or significantly delayed, your blood sugar could go low. Please come  back for a follow-up appointment in 2 weeks.     Please change all 3 insulins back to the NPH, 45 units each morning.  However, if you are going to be active, take just 25 units.

## 2015-12-12 NOTE — Telephone Encounter (Signed)
Noted! Thank you

## 2015-12-12 NOTE — Telephone Encounter (Signed)
Called and spoke with pt; pt states she missed her appt because she got up this morning and her blood sugar was 35.  Per pt she ate waffles and drank juice and now her blood sugar is 118.  Advised pt to continue to monitor her blood sugar readings and call the office if further assistance is needed.  Pt verbalized understanding and is aware of her new appt on Thursday at 10:30 am.

## 2015-12-12 NOTE — Patient Instructions (Addendum)
check your blood sugar twice a day.  vary the time of day when you check, between before the 3 meals, and at bedtime.  also check if you have symptoms of your blood sugar being too high or too low.  please keep a record of the readings and bring it to your next appointment here.  You can write it on any piece of paper.  please call us sooner if your blood sugar goes below 70, or if you have a lot of readings over 200.    On this type of insulin schedule, you should eat meals on a regular schedule.  If a meal is missed or significantly delayed, your blood sugar could go low. Please come back for a follow-up appointment in 2 weeks.     Please change all 3 insulins back to the NPH, 45 units each morning.  However, if you are going to be active, take just 25 units.

## 2015-12-13 ENCOUNTER — Telehealth: Payer: Self-pay | Admitting: Endocrinology

## 2015-12-13 DIAGNOSIS — I6330 Cerebral infarction due to thrombosis of unspecified cerebral artery: Secondary | ICD-10-CM | POA: Diagnosis not present

## 2015-12-13 DIAGNOSIS — E1010 Type 1 diabetes mellitus with ketoacidosis without coma: Secondary | ICD-10-CM

## 2015-12-13 DIAGNOSIS — I10 Essential (primary) hypertension: Secondary | ICD-10-CM | POA: Diagnosis not present

## 2015-12-13 DIAGNOSIS — E139 Other specified diabetes mellitus without complications: Secondary | ICD-10-CM | POA: Diagnosis not present

## 2015-12-13 NOTE — Telephone Encounter (Signed)
Pt.notified

## 2015-12-13 NOTE — Telephone Encounter (Signed)
I contacted the pt and she agreed to meeting with Bonita Quin.

## 2015-12-13 NOTE — Telephone Encounter (Signed)
Ok, i did referral 

## 2015-12-13 NOTE — Telephone Encounter (Signed)
please call patient: You should have an appointment with Bonita Quin, to go over sick-day management. Ok for an appointment?

## 2015-12-14 DIAGNOSIS — I110 Hypertensive heart disease with heart failure: Secondary | ICD-10-CM | POA: Diagnosis not present

## 2015-12-14 DIAGNOSIS — E109 Type 1 diabetes mellitus without complications: Secondary | ICD-10-CM | POA: Diagnosis not present

## 2015-12-14 DIAGNOSIS — D649 Anemia, unspecified: Secondary | ICD-10-CM | POA: Diagnosis not present

## 2015-12-14 DIAGNOSIS — I6330 Cerebral infarction due to thrombosis of unspecified cerebral artery: Secondary | ICD-10-CM | POA: Diagnosis not present

## 2015-12-14 DIAGNOSIS — M0600 Rheumatoid arthritis without rheumatoid factor, unspecified site: Secondary | ICD-10-CM | POA: Diagnosis not present

## 2015-12-14 DIAGNOSIS — E139 Other specified diabetes mellitus without complications: Secondary | ICD-10-CM | POA: Diagnosis not present

## 2015-12-14 DIAGNOSIS — I2510 Atherosclerotic heart disease of native coronary artery without angina pectoris: Secondary | ICD-10-CM | POA: Diagnosis not present

## 2015-12-14 DIAGNOSIS — R1314 Dysphagia, pharyngoesophageal phase: Secondary | ICD-10-CM | POA: Diagnosis not present

## 2015-12-14 DIAGNOSIS — I350 Nonrheumatic aortic (valve) stenosis: Secondary | ICD-10-CM | POA: Diagnosis not present

## 2015-12-14 DIAGNOSIS — E785 Hyperlipidemia, unspecified: Secondary | ICD-10-CM | POA: Diagnosis not present

## 2015-12-14 DIAGNOSIS — I5042 Chronic combined systolic (congestive) and diastolic (congestive) heart failure: Secondary | ICD-10-CM | POA: Diagnosis not present

## 2015-12-14 DIAGNOSIS — I10 Essential (primary) hypertension: Secondary | ICD-10-CM | POA: Diagnosis not present

## 2015-12-15 ENCOUNTER — Ambulatory Visit (INDEPENDENT_AMBULATORY_CARE_PROVIDER_SITE_OTHER): Payer: Commercial Managed Care - HMO | Admitting: Adult Health

## 2015-12-15 ENCOUNTER — Encounter: Payer: Self-pay | Admitting: Adult Health

## 2015-12-15 ENCOUNTER — Other Ambulatory Visit: Payer: Self-pay

## 2015-12-15 VITALS — BP 124/84 | Temp 98.4°F | Ht 63.0 in | Wt 152.8 lb

## 2015-12-15 DIAGNOSIS — I10 Essential (primary) hypertension: Secondary | ICD-10-CM | POA: Diagnosis not present

## 2015-12-15 DIAGNOSIS — I6330 Cerebral infarction due to thrombosis of unspecified cerebral artery: Secondary | ICD-10-CM | POA: Diagnosis not present

## 2015-12-15 DIAGNOSIS — Z954 Presence of other heart-valve replacement: Secondary | ICD-10-CM

## 2015-12-15 DIAGNOSIS — Z952 Presence of prosthetic heart valve: Secondary | ICD-10-CM

## 2015-12-15 DIAGNOSIS — Z09 Encounter for follow-up examination after completed treatment for conditions other than malignant neoplasm: Secondary | ICD-10-CM

## 2015-12-15 DIAGNOSIS — E139 Other specified diabetes mellitus without complications: Secondary | ICD-10-CM | POA: Diagnosis not present

## 2015-12-15 LAB — BASIC METABOLIC PANEL
BUN: 13 mg/dL (ref 6–23)
CHLORIDE: 96 meq/L (ref 96–112)
CO2: 25 mEq/L (ref 19–32)
Calcium: 9.2 mg/dL (ref 8.4–10.5)
Creatinine, Ser: 0.74 mg/dL (ref 0.40–1.20)
GFR: 86.61 mL/min (ref >60.00)
Glucose, Bld: 291 mg/dL — ABNORMAL HIGH (ref 70–99)
POTASSIUM: 4 meq/L (ref 3.5–5.1)
SODIUM: 133 meq/L — AB (ref 135–145)

## 2015-12-15 LAB — CBC WITH DIFFERENTIAL/PLATELET
BASOS PCT: 0.4 % (ref 0.0–3.0)
Basophils Absolute: 0 10*3/uL (ref 0.0–0.1)
EOS PCT: 0.2 % (ref 0.0–5.0)
Eosinophils Absolute: 0 10*3/uL (ref 0.0–0.7)
HEMATOCRIT: 35.4 % — AB (ref 36.0–46.0)
HEMOGLOBIN: 11 g/dL — AB (ref 12.0–15.0)
LYMPHS PCT: 22.9 % (ref 12.0–46.0)
Lymphs Abs: 1.7 10*3/uL (ref 0.7–4.0)
MCHC: 31.1 g/dL (ref 30.0–36.0)
MCV: 78.3 fl (ref 78.0–100.0)
MONO ABS: 0.2 10*3/uL (ref 0.1–1.0)
MONOS PCT: 2.5 % — AB (ref 3.0–12.0)
Neutro Abs: 5.4 10*3/uL (ref 1.4–7.7)
Neutrophils Relative %: 74 % (ref 43.0–77.0)
Platelets: 304 10*3/uL (ref 150.0–400.0)
RBC: 4.52 Mil/uL (ref 3.87–5.11)
RDW: 16.8 % — AB (ref 11.5–15.5)
WBC: 7.3 10*3/uL (ref 4.0–10.5)

## 2015-12-15 LAB — POCT INR: INR: 3.4

## 2015-12-15 NOTE — Patient Instructions (Signed)
Madison [name],   It was great seeing you again and I am happy that you are feeling better. Continue to monitor your blood sugars and follow Dr. George Hugh recommendations.   I will follow up with ou regarding your blood work.   Please follow up with me soon for your complete physical.

## 2015-12-15 NOTE — Progress Notes (Signed)
Subjective:    Patient ID: Madison [name], female    DOB: [birth year], 55 y.o.   MRN: [id number]  HPI  [name] presents to the office today for follow up after recent hospital admission. She was seen in the ER and admitted on 11/22/2015 and discharged on 11/25/2015. Per discharge note:  presented to the [facility] ED on 11/23/15 with complaints of elevated blood sugars, nausea and vomiting. Her symptoms started after she went out to eat with her sister for lunch at a [place] the day prior to admission. She claims compliance with diabetic medications and diet. She is on only long-acting insulin/Humulin N 32 units subcutaneous daily. Her CBGs at home apparently range in the 150s-200s and she usually checks her CBGs 4 times a day. On the evening of admission, her glucometer read "high". In the ED, blood glucose was 766, anion gap 23, urine microscopy was positive for ketones. She was admitted for evaluation and management of DKA.  She saw Dr. Romero Belling with Endocrinology on 12/13/2015. Her new instructions were to take 45 units of NPH in the morning and discontinue all other insulins.   Since seeing Dr. Everardo All, she reports " I feel pretty good. I got over my stomach bug and my sugar is back to normal." She denies having any complaints.   Review of Systems  Constitutional: Negative.   Respiratory: Negative.   Cardiovascular: Negative.   Gastrointestinal: Negative.   Neurological: Negative.   All other systems reviewed and are negative.  Past Medical History  Diagnosis Date  . CAD (coronary artery disease)   . Obesity   . Hypercholesteremia   . HTN (hypertension)   . Dyslipidemia   . Aortic stenosis   . Thrombophlebitis   . Heart murmur   . Myocardial infarction (HCC) 12/2014  . Pneumonia 11/2014; 12/2014  . GERD (gastroesophageal reflux disease)   . Stroke syndrome [facility]) 1995; 2001    "when my son was born; problems w/speech and L hand since then" (01/19/2015)  . Anxiety     . Depression   . Allergy   . CHF (congestive heart failure) (HCC)   . Rheumatoid arthritis(714.0)     "hands" (07/01/2015)  . Diabetes (HCC) dx'd 1982    "went straight to insulin"    Social History   Social History  . Marital Status: Divorced    Spouse Name: N/A  . Number of Children: N/A  . Years of Education: N/A   Occupational History  . Not on file.   Social History Main Topics  . Smoking status: Never Smoker   . Smokeless tobacco: Never Used  . Alcohol Use: No  . Drug Use: No  . Sexual Activity: No   Other Topics Concern  . Not on file   Social History Narrative   Divroced and lives at home.    Has two sons who live in [place] at this time. (21&8 y/o).    Has a cat    Walks a lot.       She is having great success with her diet       Working with exercises that were given to her by PT/OT.     Past Surgical History  Procedure Laterality Date  . Cesarean section  1995  . Foot fracture surgery    . Knee arthroscopy Right   . Neuroplasty / transposition median nerve at carpal tunnel    . Left heart catheterization with coronary angiogram N/A 12/08/2014  Procedure: LEFT HEART CATHETERIZATION WITH CORONARY ANGIOGRAM;  Surgeon: Iran Ouch, MD;  Location: [facility] CATH LAB;  Service: Cardiovascular;  Laterality: N/A;  . Percutaneous coronary stent intervention (pci-s) N/A 12/09/2014    Procedure: PERCUTANEOUS CORONARY STENT INTERVENTION (PCI-S);  Surgeon: [name], MD;  Location: [facility] CATH LAB;  Service: Cardiovascular;  Laterality: N/A;  . Cardiac catheterization  12/08/2014  . Coronary angioplasty with stent placement  12/09/2014  . Aortic valve replacement (avr)/coronary artery bypass grafting (cabg)  "2014"    [name] 03/08/2014  . Fracture surgery    . Tubal ligation  1995  . Cataract extraction Left   . Coronary artery bypass graft    . Orif ankle fracture Left 07/04/2015    Procedure: OPEN REDUCTION INTERNAL FIXATION (ORIF)  TRIMAL ANKLE FRACTURE;   Surgeon: [name], MD;  Location: [facility] OR;  Service: Orthopedics;  Laterality: Left;    Family History  Problem Relation Age of Onset  . Stroke    . Heart disease Mother   . Heart disease Sister     Allergies  Allergen Reactions  . Other Other (See Comments)    Can burn skin if left on too long.  . Celebrex [Celecoxib] Rash  . Detrol [Tolterodine] Hives    Current Outpatient Prescriptions on File Prior to Visit  Medication Sig Dispense Refill  . acetaminophen (TYLENOL) 325 MG tablet Take 2 tablets (650 mg total) by mouth every 6 (six) hours as needed for mild pain (temp > 101.5).    Marland Kitchen aspirin 81 MG tablet Take 1 tablet (81 mg total) by mouth daily. 30 tablet   . escitalopram (LEXAPRO) 20 MG tablet Take 1 tablet (20 mg total) by mouth daily. 30 tablet 6  . Insulin NPH, Human,, Isophane, (HUMULIN N KWIKPEN) 100 UNIT/ML Kiwkpen Inject 45 Units into the skin every morning. And pen needles 1/day    . omeprazole (PRILOSEC) 40 MG capsule TAKE 1 CAPSULE (40 MG TOTAL) BY MOUTH DAILY. 90 capsule 1  . pravastatin (PRAVACHOL) 20 MG tablet Take 1 tablet (20 mg total) by mouth daily. 90 tablet 0  . solifenacin (VESICARE) 5 MG tablet Take 1 tablet (5 mg total) by mouth daily. 90 tablet 2  . warfarin (COUMADIN) 5 MG tablet Take 7.5 mg by mouth daily.     No current facility-administered medications on file prior to visit.    BP 124/84 mmHg  Temp(Src) 98.4 F (36.9 C) (Oral)  Ht 5\' 3"  (1.6 m)  Wt 152 lb 12.8 oz (69.31 kg)  BMI 27.07 kg/m2        Objective:   Physical Exam  Constitutional: She is oriented to person, place, and time. She appears well-developed and well-nourished. No distress.  Cardiovascular: Normal rate, regular rhythm, normal heart sounds and intact distal pulses.  Exam reveals no gallop and no friction rub.   No murmur heard. Pulmonary/Chest: Effort normal and breath sounds normal. No respiratory distress. She has no wheezes. She has no rales. She exhibits no  tenderness.  Neurological: She is alert and oriented to person, place, and time.  Skin: Skin is warm and dry. No rash noted. She is not diaphoretic. No erythema. No pallor.  Psychiatric: She has a normal mood and affect. Her behavior is normal. Judgment and thought content normal.  Nursing note and vitals reviewed.     Assessment & Plan:  1. Hospital discharge follow-up - Basic metabolic panel - CBC with Differential/Platelet - Continue with Dr. recommendations  - Follow  up for complete physical exam 2. S/P aortic valve replacement - POC INR

## 2015-12-15 NOTE — Progress Notes (Signed)
Pre visit review using our clinic review tool, if applicable. No additional management support is needed unless otherwise documented below in the visit note. 

## 2015-12-15 NOTE — Patient Outreach (Signed)
This RNCM was successful in making contact with patient who identiifed herself by providng date of birth and address. Patient has been compliant with testing her blood sugars. Patient needs a scale for daily weights  Plan: Deliver scale to patient on Monday, January 30

## 2015-12-16 ENCOUNTER — Ambulatory Visit (INDEPENDENT_AMBULATORY_CARE_PROVIDER_SITE_OTHER): Payer: Commercial Managed Care - HMO | Admitting: General Practice

## 2015-12-16 DIAGNOSIS — Z952 Presence of prosthetic heart valve: Secondary | ICD-10-CM

## 2015-12-16 DIAGNOSIS — Z954 Presence of other heart-valve replacement: Secondary | ICD-10-CM

## 2015-12-16 DIAGNOSIS — E139 Other specified diabetes mellitus without complications: Secondary | ICD-10-CM | POA: Diagnosis not present

## 2015-12-16 DIAGNOSIS — Z7901 Long term (current) use of anticoagulants: Secondary | ICD-10-CM

## 2015-12-16 DIAGNOSIS — I6330 Cerebral infarction due to thrombosis of unspecified cerebral artery: Secondary | ICD-10-CM | POA: Diagnosis not present

## 2015-12-16 DIAGNOSIS — I10 Essential (primary) hypertension: Secondary | ICD-10-CM | POA: Diagnosis not present

## 2015-12-16 NOTE — Progress Notes (Signed)
Pre visit review using our clinic review tool, if applicable. No additional management support is needed unless otherwise documented below in the visit note. 

## 2015-12-16 NOTE — Progress Notes (Signed)
I have reviewed and agree with the plan. 

## 2015-12-17 DIAGNOSIS — I6330 Cerebral infarction due to thrombosis of unspecified cerebral artery: Secondary | ICD-10-CM | POA: Diagnosis not present

## 2015-12-17 DIAGNOSIS — E139 Other specified diabetes mellitus without complications: Secondary | ICD-10-CM | POA: Diagnosis not present

## 2015-12-17 DIAGNOSIS — I10 Essential (primary) hypertension: Secondary | ICD-10-CM | POA: Diagnosis not present

## 2015-12-18 DIAGNOSIS — I6330 Cerebral infarction due to thrombosis of unspecified cerebral artery: Secondary | ICD-10-CM | POA: Diagnosis not present

## 2015-12-18 DIAGNOSIS — E139 Other specified diabetes mellitus without complications: Secondary | ICD-10-CM | POA: Diagnosis not present

## 2015-12-18 DIAGNOSIS — I10 Essential (primary) hypertension: Secondary | ICD-10-CM | POA: Diagnosis not present

## 2015-12-19 ENCOUNTER — Other Ambulatory Visit: Payer: Self-pay

## 2015-12-19 ENCOUNTER — Ambulatory Visit: Payer: Commercial Managed Care - HMO

## 2015-12-19 DIAGNOSIS — E119 Type 2 diabetes mellitus without complications: Secondary | ICD-10-CM | POA: Diagnosis not present

## 2015-12-20 DIAGNOSIS — E119 Type 2 diabetes mellitus without complications: Secondary | ICD-10-CM | POA: Diagnosis not present

## 2015-12-21 DIAGNOSIS — I6330 Cerebral infarction due to thrombosis of unspecified cerebral artery: Secondary | ICD-10-CM | POA: Diagnosis not present

## 2015-12-21 DIAGNOSIS — E139 Other specified diabetes mellitus without complications: Secondary | ICD-10-CM | POA: Diagnosis not present

## 2015-12-21 DIAGNOSIS — I10 Essential (primary) hypertension: Secondary | ICD-10-CM | POA: Diagnosis not present

## 2015-12-22 DIAGNOSIS — R1314 Dysphagia, pharyngoesophageal phase: Secondary | ICD-10-CM | POA: Diagnosis not present

## 2015-12-22 DIAGNOSIS — I110 Hypertensive heart disease with heart failure: Secondary | ICD-10-CM | POA: Diagnosis not present

## 2015-12-22 DIAGNOSIS — I2510 Atherosclerotic heart disease of native coronary artery without angina pectoris: Secondary | ICD-10-CM | POA: Diagnosis not present

## 2015-12-22 DIAGNOSIS — I350 Nonrheumatic aortic (valve) stenosis: Secondary | ICD-10-CM | POA: Diagnosis not present

## 2015-12-22 DIAGNOSIS — E139 Other specified diabetes mellitus without complications: Secondary | ICD-10-CM | POA: Diagnosis not present

## 2015-12-22 DIAGNOSIS — E785 Hyperlipidemia, unspecified: Secondary | ICD-10-CM | POA: Diagnosis not present

## 2015-12-22 DIAGNOSIS — E109 Type 1 diabetes mellitus without complications: Secondary | ICD-10-CM | POA: Diagnosis not present

## 2015-12-22 DIAGNOSIS — I10 Essential (primary) hypertension: Secondary | ICD-10-CM | POA: Diagnosis not present

## 2015-12-22 DIAGNOSIS — M0600 Rheumatoid arthritis without rheumatoid factor, unspecified site: Secondary | ICD-10-CM | POA: Diagnosis not present

## 2015-12-22 DIAGNOSIS — D649 Anemia, unspecified: Secondary | ICD-10-CM | POA: Diagnosis not present

## 2015-12-22 DIAGNOSIS — I6330 Cerebral infarction due to thrombosis of unspecified cerebral artery: Secondary | ICD-10-CM | POA: Diagnosis not present

## 2015-12-22 DIAGNOSIS — I5042 Chronic combined systolic (congestive) and diastolic (congestive) heart failure: Secondary | ICD-10-CM | POA: Diagnosis not present

## 2015-12-23 DIAGNOSIS — I6330 Cerebral infarction due to thrombosis of unspecified cerebral artery: Secondary | ICD-10-CM | POA: Diagnosis not present

## 2015-12-23 DIAGNOSIS — I10 Essential (primary) hypertension: Secondary | ICD-10-CM | POA: Diagnosis not present

## 2015-12-23 DIAGNOSIS — E139 Other specified diabetes mellitus without complications: Secondary | ICD-10-CM | POA: Diagnosis not present

## 2015-12-24 DIAGNOSIS — E119 Type 2 diabetes mellitus without complications: Secondary | ICD-10-CM | POA: Diagnosis not present

## 2015-12-25 DIAGNOSIS — E119 Type 2 diabetes mellitus without complications: Secondary | ICD-10-CM | POA: Diagnosis not present

## 2015-12-26 ENCOUNTER — Ambulatory Visit (INDEPENDENT_AMBULATORY_CARE_PROVIDER_SITE_OTHER): Payer: Commercial Managed Care - HMO | Admitting: Endocrinology

## 2015-12-26 ENCOUNTER — Encounter: Payer: Self-pay | Admitting: Endocrinology

## 2015-12-26 VITALS — BP 122/80 | HR 81 | Temp 98.8°F | Ht 65.0 in | Wt 160.0 lb

## 2015-12-26 DIAGNOSIS — E119 Type 2 diabetes mellitus without complications: Secondary | ICD-10-CM | POA: Diagnosis not present

## 2015-12-26 DIAGNOSIS — E1010 Type 1 diabetes mellitus with ketoacidosis without coma: Secondary | ICD-10-CM | POA: Diagnosis not present

## 2015-12-26 MED ORDER — INSULIN ASPART PROT & ASPART (70-30 MIX) 100 UNIT/ML PEN: 45.0000 [IU] | PEN_INJECTOR | Freq: Two times a day (BID) | SUBCUTANEOUS | Status: DC

## 2015-12-26 NOTE — Progress Notes (Signed)
Subjective:    Patient ID: Madison [name], female    DOB: [birth year], 55 y.o.   MRN: [id number]  HPI Pt returns for f/u of diabetes mellitus: DM type: Insulin-requiring type 2 Dx'ed: 1981 Complications: polyneuropathy, retinopathy, CVA's, and CAD.   Therapy: insulin since dx.   GDM: never. DKA: last episode was in early 2017.  Severe hypoglycemia: last episode was in 2014.   Pancreatitis: never.  Other: she was on insulin pump rx until CVA's prevented her from using the device in early 2014; in early 2015, she was changed to a simple insulin schedule, due to poor results with multiple daily injections.  In 2015, she changed from QAM levemir to QAM NPH, due to am hypoglycemia and pm hyperglycemia.  Interval history: Meter is downloaded today, and the printout is scanned into the record.   It varies from 45-400.  It is in general lowest in the fasting state. She takes NPH, 45 units qam, at 9 AM, and does not miss it. She eats breakfast at 09:30, lunch at 12:30, and supper at approx 17:30.  She says he mealtimes very very little.   Past Medical History  Diagnosis Date  . CAD (coronary artery disease)   . Obesity   . Hypercholesteremia   . HTN (hypertension)   . Dyslipidemia   . Aortic stenosis   . Thrombophlebitis   . Heart murmur   . Myocardial infarction (HCC) 12/2014  . Pneumonia 11/2014; 12/2014  . GERD (gastroesophageal reflux disease)   . Stroke syndrome [facility]) 1995; 2001    "when my son was born; problems w/speech and L hand since then" (01/19/2015)  . Anxiety   . Depression   . Allergy   . CHF (congestive heart failure) (HCC)   . Rheumatoid arthritis(714.0)     "hands" (07/01/2015)  . Diabetes (HCC) dx'd 1982    "went straight to insulin"    Past Surgical History  Procedure Laterality Date  . Cesarean section  1995  . Foot fracture surgery    . Knee arthroscopy Right   . Neuroplasty / transposition median nerve at carpal tunnel    . Left heart catheterization with  coronary angiogram N/A 12/08/2014    Procedure: LEFT HEART CATHETERIZATION WITH CORONARY ANGIOGRAM;  Surgeon: Iran Ouch, MD;  Location: [facility] CATH LAB;  Service: Cardiovascular;  Laterality: N/A;  . Percutaneous coronary stent intervention (pci-s) N/A 12/09/2014    Procedure: PERCUTANEOUS CORONARY STENT INTERVENTION (PCI-S);  Surgeon: [name], MD;  Location: [facility] CATH LAB;  Service: Cardiovascular;  Laterality: N/A;  . Cardiac catheterization  12/08/2014  . Coronary angioplasty with stent placement  12/09/2014  . Aortic valve replacement (avr)/coronary artery bypass grafting (cabg)  "2014"    [name] 03/08/2014  . Fracture surgery    . Tubal ligation  1995  . Cataract extraction Left   . Coronary artery bypass graft    . Orif ankle fracture Left 07/04/2015    Procedure: OPEN REDUCTION INTERNAL FIXATION (ORIF)  TRIMAL ANKLE FRACTURE;  Surgeon: [name], MD;  Location: [facility] OR;  Service: Orthopedics;  Laterality: Left;    Social History   Social History  . Marital Status: Divorced    Spouse Name: N/A  . Number of Children: N/A  . Years of Education: N/A   Occupational History  . Not on file.   Social History Main Topics  . Smoking status: Never Smoker   . Smokeless tobacco: Never Used  . Alcohol Use: No  .  Drug Use: No  . Sexual Activity: No   Other Topics Concern  . Not on file   Social History Narrative   Divroced and lives at home.    Has two sons who live in [place] at this time. (21&8 y/o).    Has a cat    Walks a lot.       She is having great success with her diet       Working with exercises that were given to her by PT/OT.     Current Outpatient Prescriptions on File Prior to Visit  Medication Sig Dispense Refill  . ACCU-CHEK AVIVA PLUS test strip     . acetaminophen (TYLENOL) 325 MG tablet Take 2 tablets (650 mg total) by mouth every 6 (six) hours as needed for mild pain (temp > 101.5).    Marland Kitchen aspirin 81 MG tablet Take 1 tablet (81 mg total) by mouth daily.  30 tablet   . escitalopram (LEXAPRO) 20 MG tablet Take 1 tablet (20 mg total) by mouth daily. 30 tablet 6  . lisinopril (PRINIVIL,ZESTRIL) 2.5 MG tablet     . omeprazole (PRILOSEC) 40 MG capsule TAKE 1 CAPSULE (40 MG TOTAL) BY MOUTH DAILY. 90 capsule 1  . pravastatin (PRAVACHOL) 20 MG tablet Take 1 tablet (20 mg total) by mouth daily. 90 tablet 0  . solifenacin (VESICARE) 5 MG tablet Take 1 tablet (5 mg total) by mouth daily. 90 tablet 2  . warfarin (COUMADIN) 5 MG tablet Take 7.5 mg by mouth daily.     No current facility-administered medications on file prior to visit.    Allergies  Allergen Reactions  . Other Other (See Comments)    Can burn skin if left on too long.  . Celebrex [Celecoxib] Rash  . Detrol [Tolterodine] Hives    Family History  Problem Relation Age of Onset  . Stroke    . Heart disease Mother   . Heart disease Sister     BP 122/80 mmHg  Pulse 81  Temp(Src) 98.8 F (37.1 C) (Oral)  Ht 5\' 5"  (1.651 m)  Wt 160 lb (72.576 kg)  BMI 26.63 kg/m2  SpO2 98%    Review of Systems Denies LOC    Objective:   Physical Exam VITAL SIGNS:  See vs page GENERAL: no distress Pulses: dorsalis pedis intact bilat.   MSK: no deformity of the feet. CV: no leg edema.   Skin:  no ulcer on the feet.  normal color and temp on the feet. Neuro: sensation is intact to touch on the feet, but decreased from normal.    Lab Results  Component Value Date   HGBA1C 12.3* 11/23/2015       Assessment & Plan:  DM: Based on the pattern of her cbg's, she a faster-acting QD insulin.    Patient is advised the following: Patient Instructions  check your blood sugar twice a day.  vary the time of day when you check, between before the 3 meals, and at bedtime.  also check if you have symptoms of your blood sugar being too high or too low.  please keep a record of the readings and bring it to your next appointment here.  You can write it on any piece of paper.  please call us sooner if  your blood sugar goes below 70, or if you have a lot of readings over 200.    On this type of insulin schedule, you should eat meals on a regular schedule.  If  a meal is missed or significantly delayed, your blood sugar could go low. Please come back for a follow-up appointment in 3 weeks.     Please change the NPH to Novolog 70/30, 45 units each morning.  However, if you are going to be active, take just 25 units.   On this new insulin, it is most important to eat breakfast, and to take the insulin with breakfast.

## 2015-12-26 NOTE — Patient Instructions (Addendum)
check your blood sugar twice a day.  vary the time of day when you check, between before the 3 meals, and at bedtime.  also check if you have symptoms of your blood sugar being too high or too low.  please keep a record of the readings and bring it to your next appointment here.  You can write it on any piece of paper.  please call us sooner if your blood sugar goes below 70, or if you have a lot of readings over 200.    On this type of insulin schedule, you should eat meals on a regular schedule.  If a meal is missed or significantly delayed, your blood sugar could go low. Please come back for a follow-up appointment in 3 weeks.     Please change the NPH to Novolog 70/30, 45 units each morning.  However, if you are going to be active, take just 25 units.   On this new insulin, it is most important to eat breakfast, and to take the insulin with breakfast.

## 2015-12-27 DIAGNOSIS — E119 Type 2 diabetes mellitus without complications: Secondary | ICD-10-CM | POA: Diagnosis not present

## 2015-12-28 ENCOUNTER — Ambulatory Visit: Payer: Commercial Managed Care - HMO | Admitting: Nutrition

## 2015-12-28 DIAGNOSIS — E119 Type 2 diabetes mellitus without complications: Secondary | ICD-10-CM | POA: Diagnosis not present

## 2015-12-29 DIAGNOSIS — E119 Type 2 diabetes mellitus without complications: Secondary | ICD-10-CM | POA: Diagnosis not present

## 2015-12-30 DIAGNOSIS — E119 Type 2 diabetes mellitus without complications: Secondary | ICD-10-CM | POA: Diagnosis not present

## 2016-01-01 DIAGNOSIS — E119 Type 2 diabetes mellitus without complications: Secondary | ICD-10-CM | POA: Diagnosis not present

## 2016-01-02 DIAGNOSIS — E119 Type 2 diabetes mellitus without complications: Secondary | ICD-10-CM | POA: Diagnosis not present

## 2016-01-03 DIAGNOSIS — E119 Type 2 diabetes mellitus without complications: Secondary | ICD-10-CM | POA: Diagnosis not present

## 2016-01-04 ENCOUNTER — Telehealth: Payer: Self-pay | Admitting: Adult Health

## 2016-01-04 ENCOUNTER — Other Ambulatory Visit: Payer: Self-pay | Admitting: Adult Health

## 2016-01-04 DIAGNOSIS — E119 Type 2 diabetes mellitus without complications: Secondary | ICD-10-CM | POA: Diagnosis not present

## 2016-01-04 MED ORDER — ONDANSETRON HCL 4 MG PO TABS: 4.0000 mg | ORAL_TABLET | Freq: Three times a day (TID) | ORAL | Status: DC | PRN

## 2016-01-04 NOTE — Telephone Encounter (Signed)
Pt states she has been throwing up yesterday, all night and today.  Pt has not eaten anything but has been drinking and throws it up. Would like to know if Madison [name] will send something in to [facility] / [address]

## 2016-01-04 NOTE — Telephone Encounter (Signed)
Zofran sent in

## 2016-01-05 DIAGNOSIS — E119 Type 2 diabetes mellitus without complications: Secondary | ICD-10-CM | POA: Diagnosis not present

## 2016-01-05 NOTE — Telephone Encounter (Signed)
Called and spoke with pt and pt is aware zofran sent in.

## 2016-01-06 DIAGNOSIS — E119 Type 2 diabetes mellitus without complications: Secondary | ICD-10-CM | POA: Diagnosis not present

## 2016-01-07 DIAGNOSIS — E109 Type 1 diabetes mellitus without complications: Secondary | ICD-10-CM | POA: Diagnosis not present

## 2016-01-07 DIAGNOSIS — E785 Hyperlipidemia, unspecified: Secondary | ICD-10-CM | POA: Diagnosis not present

## 2016-01-07 DIAGNOSIS — I5042 Chronic combined systolic (congestive) and diastolic (congestive) heart failure: Secondary | ICD-10-CM | POA: Diagnosis not present

## 2016-01-07 DIAGNOSIS — R1314 Dysphagia, pharyngoesophageal phase: Secondary | ICD-10-CM | POA: Diagnosis not present

## 2016-01-07 DIAGNOSIS — I110 Hypertensive heart disease with heart failure: Secondary | ICD-10-CM | POA: Diagnosis not present

## 2016-01-07 DIAGNOSIS — I350 Nonrheumatic aortic (valve) stenosis: Secondary | ICD-10-CM | POA: Diagnosis not present

## 2016-01-07 DIAGNOSIS — M0600 Rheumatoid arthritis without rheumatoid factor, unspecified site: Secondary | ICD-10-CM | POA: Diagnosis not present

## 2016-01-07 DIAGNOSIS — E119 Type 2 diabetes mellitus without complications: Secondary | ICD-10-CM | POA: Diagnosis not present

## 2016-01-07 DIAGNOSIS — I2510 Atherosclerotic heart disease of native coronary artery without angina pectoris: Secondary | ICD-10-CM | POA: Diagnosis not present

## 2016-01-07 DIAGNOSIS — D649 Anemia, unspecified: Secondary | ICD-10-CM | POA: Diagnosis not present

## 2016-01-08 DIAGNOSIS — E119 Type 2 diabetes mellitus without complications: Secondary | ICD-10-CM | POA: Diagnosis not present

## 2016-01-09 DIAGNOSIS — I2510 Atherosclerotic heart disease of native coronary artery without angina pectoris: Secondary | ICD-10-CM | POA: Diagnosis not present

## 2016-01-09 DIAGNOSIS — E119 Type 2 diabetes mellitus without complications: Secondary | ICD-10-CM | POA: Diagnosis not present

## 2016-01-09 DIAGNOSIS — D649 Anemia, unspecified: Secondary | ICD-10-CM | POA: Diagnosis not present

## 2016-01-09 DIAGNOSIS — M0600 Rheumatoid arthritis without rheumatoid factor, unspecified site: Secondary | ICD-10-CM | POA: Diagnosis not present

## 2016-01-09 DIAGNOSIS — R1314 Dysphagia, pharyngoesophageal phase: Secondary | ICD-10-CM | POA: Diagnosis not present

## 2016-01-09 DIAGNOSIS — I5042 Chronic combined systolic (congestive) and diastolic (congestive) heart failure: Secondary | ICD-10-CM | POA: Diagnosis not present

## 2016-01-09 DIAGNOSIS — E785 Hyperlipidemia, unspecified: Secondary | ICD-10-CM | POA: Diagnosis not present

## 2016-01-09 DIAGNOSIS — I110 Hypertensive heart disease with heart failure: Secondary | ICD-10-CM | POA: Diagnosis not present

## 2016-01-09 DIAGNOSIS — E109 Type 1 diabetes mellitus without complications: Secondary | ICD-10-CM | POA: Diagnosis not present

## 2016-01-09 DIAGNOSIS — I350 Nonrheumatic aortic (valve) stenosis: Secondary | ICD-10-CM | POA: Diagnosis not present

## 2016-01-10 ENCOUNTER — Telehealth: Payer: Self-pay | Admitting: Endocrinology

## 2016-01-10 DIAGNOSIS — E109 Type 1 diabetes mellitus without complications: Secondary | ICD-10-CM | POA: Diagnosis not present

## 2016-01-10 DIAGNOSIS — I5042 Chronic combined systolic (congestive) and diastolic (congestive) heart failure: Secondary | ICD-10-CM | POA: Diagnosis not present

## 2016-01-10 DIAGNOSIS — E119 Type 2 diabetes mellitus without complications: Secondary | ICD-10-CM | POA: Diagnosis not present

## 2016-01-10 DIAGNOSIS — I110 Hypertensive heart disease with heart failure: Secondary | ICD-10-CM | POA: Diagnosis not present

## 2016-01-10 DIAGNOSIS — I350 Nonrheumatic aortic (valve) stenosis: Secondary | ICD-10-CM | POA: Diagnosis not present

## 2016-01-10 DIAGNOSIS — M0600 Rheumatoid arthritis without rheumatoid factor, unspecified site: Secondary | ICD-10-CM | POA: Diagnosis not present

## 2016-01-10 DIAGNOSIS — E785 Hyperlipidemia, unspecified: Secondary | ICD-10-CM | POA: Diagnosis not present

## 2016-01-10 DIAGNOSIS — D649 Anemia, unspecified: Secondary | ICD-10-CM | POA: Diagnosis not present

## 2016-01-10 DIAGNOSIS — R1314 Dysphagia, pharyngoesophageal phase: Secondary | ICD-10-CM | POA: Diagnosis not present

## 2016-01-10 DIAGNOSIS — I2510 Atherosclerotic heart disease of native coronary artery without angina pectoris: Secondary | ICD-10-CM | POA: Diagnosis not present

## 2016-01-10 MED ORDER — INSULIN ASPART PROT & ASPART (70-30 MIX) 100 UNIT/ML PEN: 45.0000 [IU] | PEN_INJECTOR | Freq: Two times a day (BID) | SUBCUTANEOUS | Status: DC

## 2016-01-10 NOTE — Telephone Encounter (Signed)
I contacted the pt and advised Dr. Everardo All has rx'd novolog 70/30 inplace of the Humulin. Pt voiced understanding.

## 2016-01-10 NOTE — Telephone Encounter (Signed)
Patient called stating that her insurance will not cover her Rx   Rx: Humulin   Can we please find her another alternate Rx?   Please advise   Thank you

## 2016-01-11 DIAGNOSIS — E119 Type 2 diabetes mellitus without complications: Secondary | ICD-10-CM | POA: Diagnosis not present

## 2016-01-12 DIAGNOSIS — E119 Type 2 diabetes mellitus without complications: Secondary | ICD-10-CM | POA: Diagnosis not present

## 2016-01-13 DIAGNOSIS — E119 Type 2 diabetes mellitus without complications: Secondary | ICD-10-CM | POA: Diagnosis not present

## 2016-01-14 DIAGNOSIS — E119 Type 2 diabetes mellitus without complications: Secondary | ICD-10-CM | POA: Diagnosis not present

## 2016-01-15 DIAGNOSIS — E119 Type 2 diabetes mellitus without complications: Secondary | ICD-10-CM | POA: Diagnosis not present

## 2016-01-16 ENCOUNTER — Ambulatory Visit: Payer: Commercial Managed Care - HMO | Admitting: Endocrinology

## 2016-01-16 ENCOUNTER — Ambulatory Visit: Payer: Commercial Managed Care - HMO

## 2016-01-16 DIAGNOSIS — E119 Type 2 diabetes mellitus without complications: Secondary | ICD-10-CM | POA: Diagnosis not present

## 2016-01-17 DIAGNOSIS — E119 Type 2 diabetes mellitus without complications: Secondary | ICD-10-CM | POA: Diagnosis not present

## 2016-01-18 DIAGNOSIS — E161 Other hypoglycemia: Secondary | ICD-10-CM | POA: Diagnosis not present

## 2016-01-18 DIAGNOSIS — E119 Type 2 diabetes mellitus without complications: Secondary | ICD-10-CM | POA: Diagnosis not present

## 2016-01-18 DIAGNOSIS — R7309 Other abnormal glucose: Secondary | ICD-10-CM | POA: Diagnosis not present

## 2016-01-19 ENCOUNTER — Telehealth: Payer: Self-pay | Admitting: Endocrinology

## 2016-01-19 DIAGNOSIS — E785 Hyperlipidemia, unspecified: Secondary | ICD-10-CM | POA: Diagnosis not present

## 2016-01-19 DIAGNOSIS — D649 Anemia, unspecified: Secondary | ICD-10-CM | POA: Diagnosis not present

## 2016-01-19 DIAGNOSIS — M0600 Rheumatoid arthritis without rheumatoid factor, unspecified site: Secondary | ICD-10-CM | POA: Diagnosis not present

## 2016-01-19 DIAGNOSIS — I350 Nonrheumatic aortic (valve) stenosis: Secondary | ICD-10-CM | POA: Diagnosis not present

## 2016-01-19 DIAGNOSIS — I2510 Atherosclerotic heart disease of native coronary artery without angina pectoris: Secondary | ICD-10-CM | POA: Diagnosis not present

## 2016-01-19 DIAGNOSIS — R1314 Dysphagia, pharyngoesophageal phase: Secondary | ICD-10-CM | POA: Diagnosis not present

## 2016-01-19 DIAGNOSIS — I110 Hypertensive heart disease with heart failure: Secondary | ICD-10-CM | POA: Diagnosis not present

## 2016-01-19 DIAGNOSIS — E119 Type 2 diabetes mellitus without complications: Secondary | ICD-10-CM | POA: Diagnosis not present

## 2016-01-19 DIAGNOSIS — E109 Type 1 diabetes mellitus without complications: Secondary | ICD-10-CM | POA: Diagnosis not present

## 2016-01-19 DIAGNOSIS — I5042 Chronic combined systolic (congestive) and diastolic (congestive) heart failure: Secondary | ICD-10-CM | POA: Diagnosis not present

## 2016-01-19 LAB — POCT INR: INR: 1.5

## 2016-01-19 NOTE — Telephone Encounter (Signed)
Pt has had severe lows with BS yesterday and today it was 17 yesterday and 33 about 4 hours ago right now its 216  The worst lows are when she 1st wakes

## 2016-01-19 NOTE — Telephone Encounter (Signed)
Reduce novolog 70/30 to 35 units each morning. However, if you are going to be active, take just 30 units. Ov next week

## 2016-01-19 NOTE — Telephone Encounter (Signed)
See note below and please advise, Thanks! 

## 2016-01-20 DIAGNOSIS — E119 Type 2 diabetes mellitus without complications: Secondary | ICD-10-CM | POA: Diagnosis not present

## 2016-01-20 NOTE — Telephone Encounter (Signed)
Pt's aid advised of new instructions and voiced understanding. Pt will call back and schedule office visit.

## 2016-01-20 NOTE — Telephone Encounter (Signed)
Attempted to reach the pt. Pt was unavailable. Will try again at a later time.  

## 2016-01-21 ENCOUNTER — Encounter (HOSPITAL_COMMUNITY): Payer: Self-pay

## 2016-01-21 ENCOUNTER — Inpatient Hospital Stay (HOSPITAL_COMMUNITY)
Admission: EM | Admit: 2016-01-21 | Discharge: 2016-01-24 | DRG: 638 | Disposition: A | Discharge status: Home Health | Payer: Commercial Managed Care - HMO | Attending: Internal Medicine | Admitting: Internal Medicine

## 2016-01-21 DIAGNOSIS — Z794 Long term (current) use of insulin: Secondary | ICD-10-CM | POA: Diagnosis not present

## 2016-01-21 DIAGNOSIS — F419 Anxiety disorder, unspecified: Secondary | ICD-10-CM | POA: Diagnosis present

## 2016-01-21 DIAGNOSIS — E1010 Type 1 diabetes mellitus with ketoacidosis without coma: Secondary | ICD-10-CM

## 2016-01-21 DIAGNOSIS — Z7901 Long term (current) use of anticoagulants: Secondary | ICD-10-CM | POA: Diagnosis not present

## 2016-01-21 DIAGNOSIS — R011 Cardiac murmur, unspecified: Secondary | ICD-10-CM | POA: Diagnosis present

## 2016-01-21 DIAGNOSIS — M069 Rheumatoid arthritis, unspecified: Secondary | ICD-10-CM | POA: Diagnosis present

## 2016-01-21 DIAGNOSIS — E1142 Type 2 diabetes mellitus with diabetic polyneuropathy: Secondary | ICD-10-CM | POA: Diagnosis present

## 2016-01-21 DIAGNOSIS — E785 Hyperlipidemia, unspecified: Secondary | ICD-10-CM | POA: Diagnosis present

## 2016-01-21 DIAGNOSIS — Z8672 Personal history of thrombophlebitis: Secondary | ICD-10-CM | POA: Diagnosis not present

## 2016-01-21 DIAGNOSIS — Z8673 Personal history of transient ischemic attack (TIA), and cerebral infarction without residual deficits: Secondary | ICD-10-CM | POA: Diagnosis not present

## 2016-01-21 DIAGNOSIS — N179 Acute kidney failure, unspecified: Secondary | ICD-10-CM | POA: Diagnosis present

## 2016-01-21 DIAGNOSIS — E119 Type 2 diabetes mellitus without complications: Secondary | ICD-10-CM | POA: Diagnosis not present

## 2016-01-21 DIAGNOSIS — E861 Hypovolemia: Secondary | ICD-10-CM | POA: Diagnosis present

## 2016-01-21 DIAGNOSIS — D649 Anemia, unspecified: Secondary | ICD-10-CM | POA: Diagnosis not present

## 2016-01-21 DIAGNOSIS — I2510 Atherosclerotic heart disease of native coronary artery without angina pectoris: Secondary | ICD-10-CM | POA: Diagnosis not present

## 2016-01-21 DIAGNOSIS — R4781 Slurred speech: Secondary | ICD-10-CM | POA: Diagnosis present

## 2016-01-21 DIAGNOSIS — I5042 Chronic combined systolic (congestive) and diastolic (congestive) heart failure: Secondary | ICD-10-CM | POA: Diagnosis not present

## 2016-01-21 DIAGNOSIS — I509 Heart failure, unspecified: Secondary | ICD-10-CM

## 2016-01-21 DIAGNOSIS — Z79899 Other long term (current) drug therapy: Secondary | ICD-10-CM

## 2016-01-21 DIAGNOSIS — Z823 Family history of stroke: Secondary | ICD-10-CM

## 2016-01-21 DIAGNOSIS — D638 Anemia in other chronic diseases classified elsewhere: Secondary | ICD-10-CM | POA: Diagnosis present

## 2016-01-21 DIAGNOSIS — I110 Hypertensive heart disease with heart failure: Secondary | ICD-10-CM | POA: Diagnosis present

## 2016-01-21 DIAGNOSIS — Z952 Presence of prosthetic heart valve: Secondary | ICD-10-CM

## 2016-01-21 DIAGNOSIS — Z8249 Family history of ischemic heart disease and other diseases of the circulatory system: Secondary | ICD-10-CM

## 2016-01-21 DIAGNOSIS — Z888 Allergy status to other drugs, medicaments and biological substances status: Secondary | ICD-10-CM

## 2016-01-21 DIAGNOSIS — I10 Essential (primary) hypertension: Secondary | ICD-10-CM | POA: Diagnosis present

## 2016-01-21 DIAGNOSIS — E1310 Other specified diabetes mellitus with ketoacidosis without coma: Secondary | ICD-10-CM | POA: Diagnosis not present

## 2016-01-21 DIAGNOSIS — I252 Old myocardial infarction: Secondary | ICD-10-CM | POA: Diagnosis not present

## 2016-01-21 DIAGNOSIS — K219 Gastro-esophageal reflux disease without esophagitis: Secondary | ICD-10-CM | POA: Diagnosis present

## 2016-01-21 DIAGNOSIS — R112 Nausea with vomiting, unspecified: Secondary | ICD-10-CM | POA: Diagnosis present

## 2016-01-21 DIAGNOSIS — Z951 Presence of aortocoronary bypass graft: Secondary | ICD-10-CM

## 2016-01-21 DIAGNOSIS — E7800 Pure hypercholesterolemia, unspecified: Secondary | ICD-10-CM | POA: Diagnosis present

## 2016-01-21 DIAGNOSIS — E1110 Type 2 diabetes mellitus with ketoacidosis without coma: Secondary | ICD-10-CM | POA: Diagnosis present

## 2016-01-21 DIAGNOSIS — F329 Major depressive disorder, single episode, unspecified: Secondary | ICD-10-CM | POA: Diagnosis present

## 2016-01-21 DIAGNOSIS — E11649 Type 2 diabetes mellitus with hypoglycemia without coma: Secondary | ICD-10-CM | POA: Diagnosis present

## 2016-01-21 DIAGNOSIS — Z7982 Long term (current) use of aspirin: Secondary | ICD-10-CM

## 2016-01-21 DIAGNOSIS — I255 Ischemic cardiomyopathy: Secondary | ICD-10-CM | POA: Diagnosis not present

## 2016-01-21 DIAGNOSIS — R000 Tachycardia, unspecified: Secondary | ICD-10-CM | POA: Diagnosis not present

## 2016-01-21 DIAGNOSIS — R531 Weakness: Secondary | ICD-10-CM | POA: Diagnosis not present

## 2016-01-21 DIAGNOSIS — R1111 Vomiting without nausea: Secondary | ICD-10-CM | POA: Diagnosis not present

## 2016-01-21 DIAGNOSIS — R404 Transient alteration of awareness: Secondary | ICD-10-CM | POA: Diagnosis not present

## 2016-01-21 LAB — CBC WITH DIFFERENTIAL/PLATELET
BASOS ABS: 0.1 10*3/uL (ref 0.0–0.1)
BASOS PCT: 1 %
EOS ABS: 0 10*3/uL (ref 0.0–0.7)
EOS PCT: 0 %
HCT: 31.2 % — ABNORMAL LOW (ref 36.0–46.0)
HEMOGLOBIN: 9.9 g/dL — AB (ref 12.0–15.0)
LYMPHS PCT: 20 %
Lymphs Abs: 2 10*3/uL (ref 0.7–4.0)
MCH: 25.3 pg — AB (ref 26.0–34.0)
MCHC: 31.7 g/dL (ref 30.0–36.0)
MCV: 79.8 fL (ref 78.0–100.0)
Monocytes Absolute: 0.4 10*3/uL (ref 0.1–1.0)
Monocytes Relative: 4 %
NEUTROS ABS: 7.4 10*3/uL (ref 1.7–7.7)
Neutrophils Relative %: 75 %
PLATELETS: 308 10*3/uL (ref 150–400)
RBC: 3.91 MIL/uL (ref 3.87–5.11)
RDW: 16.7 % — ABNORMAL HIGH (ref 11.5–15.5)
WBC: 9.8 10*3/uL (ref 4.0–10.5)

## 2016-01-21 LAB — URINALYSIS, ROUTINE W REFLEX MICROSCOPIC
Bilirubin Urine: NEGATIVE
Glucose, UA: >1000 mg/dL — AB
Hgb urine dipstick: NEGATIVE
LEUKOCYTES UA: NEGATIVE
NITRITE: NEGATIVE
PROTEIN: NEGATIVE mg/dL
Specific Gravity, Urine: 1.023 (ref 1.005–1.030)
pH: 5.5 (ref 5.0–8.0)

## 2016-01-21 LAB — COMPREHENSIVE METABOLIC PANEL
ALT: 16 U/L (ref 14–54)
ANION GAP: 26 — AB (ref 5–15)
AST: 17 U/L (ref 15–41)
Albumin: 3.6 g/dL (ref 3.5–5.0)
Alkaline Phosphatase: 91 U/L (ref 38–126)
BUN: 17 mg/dL (ref 6–20)
CHLORIDE: 94 mmol/L — AB (ref 101–111)
CO2: 9 mmol/L — ABNORMAL LOW (ref 22–32)
CREATININE: 1.39 mg/dL — AB (ref 0.44–1.00)
Calcium: 9.3 mg/dL (ref 8.9–10.3)
GFR, EST AFRICAN AMERICAN: 48 mL/min — AB (ref >60)
GFR, EST NON AFRICAN AMERICAN: 42 mL/min — AB (ref >60)
Glucose, Bld: 751 mg/dL (ref 65–99)
POTASSIUM: 5.6 mmol/L — AB (ref 3.5–5.1)
Sodium: 129 mmol/L — ABNORMAL LOW (ref 135–145)
Total Bilirubin: 1.7 mg/dL — ABNORMAL HIGH (ref 0.3–1.2)
Total Protein: 6.9 g/dL (ref 6.5–8.1)

## 2016-01-21 LAB — URINE MICROSCOPIC-ADD ON
BACTERIA UA: NONE SEEN
RBC / HPF: NONE SEEN RBC/hpf (ref 0–5)

## 2016-01-21 LAB — I-STAT CG4 LACTIC ACID, ED: LACTIC ACID, VENOUS: 3.48 mmol/L — AB (ref 0.5–2.0)

## 2016-01-21 LAB — CBG MONITORING, ED

## 2016-01-21 LAB — LIPASE, BLOOD: LIPASE: 15 U/L (ref 11–51)

## 2016-01-21 MED ORDER — SODIUM CHLORIDE 0.9 % IV SOLN
INTRAVENOUS | Status: DC
  Administered 2016-01-22: 5.4 [IU]/h via INTRAVENOUS
  Filled 2016-01-21: qty 2.5

## 2016-01-21 MED ORDER — PROMETHAZINE HCL 25 MG/ML IJ SOLN
25.0000 mg | Freq: Once | INTRAMUSCULAR | Status: AC
  Administered 2016-01-22: 25 mg via INTRAVENOUS
  Filled 2016-01-21: qty 1

## 2016-01-21 MED ORDER — ONDANSETRON HCL 4 MG/2ML IJ SOLN
4.0000 mg | Freq: Once | INTRAMUSCULAR | Status: AC
  Administered 2016-01-21: 4 mg via INTRAVENOUS
  Filled 2016-01-21: qty 2

## 2016-01-21 MED ORDER — SODIUM CHLORIDE 0.9 % IV BOLUS (SEPSIS)
1000.0000 mL | Freq: Once | INTRAVENOUS | Status: AC
  Administered 2016-01-22: 1000 mL via INTRAVENOUS

## 2016-01-21 MED ORDER — SODIUM CHLORIDE 0.9 % IV BOLUS (SEPSIS)
1000.0000 mL | Freq: Once | INTRAVENOUS | Status: AC
  Administered 2016-01-21: 1000 mL via INTRAVENOUS

## 2016-01-21 NOTE — ED Provider Notes (Signed)
CSN: [id number]     Arrival date & time 01/21/16  2215 History   First MD Initiated Contact with Patient 01/21/16 2239     Chief Complaint  Patient presents with  . Emesis  . Hyperglycemia   HPI   Madison [name] is an 55 y.o. female with history of insulin controlled DM, CAD, h/o strokes (residual slurred/slowed speech), h/o AVR, on lovenox, CHF who presents to the ED for evaluation of n/v and abdominal pain. She states her symptoms began two days ago. She states she hurts all over her abdomen. States she has thrown up 5-6 times a day. Denies hematemesis. Denies diarrhea. Denies fever or chills. She states her sugars had been low earlier this week (in the 50s) but then today it was 502 when she checked it prior to EMS arrival. Denies recent changes to her insulin or other medications. Denies chest pain or SOB. States she has been very thirsty recently. She also reports increased urinary frequency, though denies dysuria or gross hematuria. States she has been in DKA before and this does not feel as bad as DKA. One month ago she was switched from NPH to NovoLog 70/30 45U QAM.   Past Medical History  Diagnosis Date  . CAD (coronary artery disease)   . Obesity   . Hypercholesteremia   . HTN (hypertension)   . Dyslipidemia   . Aortic stenosis   . Thrombophlebitis   . Heart murmur   . Myocardial infarction (HCC) 12/2014  . Pneumonia 11/2014; 12/2014  . GERD (gastroesophageal reflux disease)   . Stroke syndrome [facility]) 1995; 2001    "when my son was born; problems w/speech and L hand since then" (01/19/2015)  . Anxiety   . Depression   . Allergy   . CHF (congestive heart failure) (HCC)   . Rheumatoid arthritis(714.0)     "hands" (07/01/2015)  . Diabetes (HCC) dx'd 1982    "went straight to insulin"   Past Surgical History  Procedure Laterality Date  . Cesarean section  1995  . Foot fracture surgery    . Knee arthroscopy Right   . Neuroplasty / transposition median nerve at carpal tunnel    .  Left heart catheterization with coronary angiogram N/A 12/08/2014    Procedure: LEFT HEART CATHETERIZATION WITH CORONARY ANGIOGRAM;  Surgeon: Iran Ouch, MD;  Location: [facility] CATH LAB;  Service: Cardiovascular;  Laterality: N/A;  . Percutaneous coronary stent intervention (pci-s) N/A 12/09/2014    Procedure: PERCUTANEOUS CORONARY STENT INTERVENTION (PCI-S);  Surgeon: [name], MD;  Location[facility] C[facility] LAB;  Service: Cardiovascular;  Laterality: N/A;  . Cardiac catheterization  12/08/2014  . Coronary angioplasty with stent placement  12/09/2014  . Aortic valve replacement (avr)/coronary artery bypass grafting (cabg)  "2014"    [name] 03/08/2014  . Fracture surgery    . Tubal ligation  1995  . Cataract extraction Left   . Coronary artery bypass graft    . Orif ankle fracture Left 07/04/2015    Procedure: OPEN REDUCTION INTERNAL FIXATION (ORIF)  TRIMAL ANKLE FRACTURE;  Surgeon: [name], MD;  Location: [facility] OR;  Service: Orthopedics;  Laterality: Left;   Family History  Problem Relation Age of Onset  . Stroke    . Heart disease Mother   . Heart disease Sister    Social History  Substance Use Topics  . Smoking status: Never Smoker   . Smokeless tobacco: Never Used  . Alcohol Use: No   OB History  No data available     Review of Systems  All other systems reviewed and are negative.     Allergies  Other; Celebrex; and Detrol  Home Medications   Prior to Admission medications   Medication Sig Start Date End Date Taking? Authorizing Provider  ACCU-CHEK AVIVA PLUS test strip  10/24/15   Historical Provider, MD  acetaminophen (TYLENOL) 325 MG tablet Take 2 tablets (650 mg total) by mouth every 6 (six) hours as needed for mild pain (temp > 101.5). 08/09/15   Starleen Arms, MD  aspirin 81 MG tablet Take 1 tablet (81 mg total) by mouth daily. 10/11/14   Leana Roe Elgergawy, MD  escitalopram (LEXAPRO) 20 MG tablet Take 1 tablet (20 mg total) by mouth daily. 11/03/15   [name], NP  insulin aspart protamine - aspart (NOVOLOG MIX 70/30 FLEXPEN) (70-30) 100 UNIT/ML FlexPen Inject 0.45 mLs (45 Units total) into the skin 2 (two) times daily. And pen needles 1/day 01/10/16   Romero Belling, MD  lisinopril (PRINIVIL,ZESTRIL) 2.5 MG tablet  10/22/15   Historical Provider, MD  omeprazole (PRILOSEC) 40 MG capsule TAKE 1 CAPSULE (40 MG TOTAL) BY MOUTH DAILY. 09/12/15   [name], NP  ondansetron (ZOFRAN) 4 MG tablet Take 1 tablet (4 mg total) by mouth every 8 (eight) hours as needed for nausea or vomiting. 01/04/16   [name], NP  pravastatin (PRAVACHOL) 20 MG tablet Take 1 tablet (20 mg total) by mouth daily. 08/17/15   [name], NP  solifenacin (VESICARE) 5 MG tablet Take 1 tablet (5 mg total) by mouth daily. 05/03/15   [name], NP  warfarin (COUMADIN) 5 MG tablet Take 7.5 mg by mouth daily.    Historical Provider, MD   BP 114/51 mmHg  Pulse 112  Temp(Src) 97.9 F (36.6 C) (Oral)  Resp 19  SpO2 100% Physical Exam  Constitutional: She is oriented to person, place, and time.  Chronically ill appearing  HENT:  Right Ear: External ear normal.  Left Ear: External ear normal.  Nose: Nose normal.  Mouth/Throat: Oropharynx is clear and moist. No oropharyngeal exudate.  Eyes: Conjunctivae and EOM are normal. Pupils are equal, round, and reactive to light.  Neck: Normal range of motion. Neck supple.  Cardiovascular: Regular rhythm and intact distal pulses.  Tachycardia present.   Murmur heard. Pulmonary/Chest: Effort normal and breath sounds normal. No respiratory distress. She has no wheezes. She exhibits no tenderness.  Abdominal: Soft. Bowel sounds are normal. She exhibits no distension. There is no rebound and no guarding.  Mild diffuse tenderness. Abdomen is soft. No rebound or guarding.  Musculoskeletal: She exhibits no edema.  Neurological: She is alert and oriented to person, place, and time. No cranial nerve deficit.  Slurred/slow speech  (baseline)  Skin: Skin is warm and dry. There is pallor.  Psychiatric: She has a normal mood and affect.  Nursing note and vitals reviewed.   ED Course  Procedures (including critical care time) Labs Review Labs Reviewed  URINALYSIS, ROUTINE W REFLEX MICROSCOPIC (NOT AT [facility]) - Abnormal; Notable for the following:    Glucose, UA >1000 (*)    Ketones, ur >80 (*)    All other components within normal limits  COMPREHENSIVE METABOLIC PANEL - Abnormal; Notable for the following:    Sodium 129 (*)    Potassium 5.6 (*)    Chloride 94 (*)    CO2 9 (*)    Glucose, Bld 751 (*)    Creatinine, Ser 1.39 (*)  Total Bilirubin 1.7 (*)    GFR calc non Af Amer 42 (*)    GFR calc Af Amer 48 (*)    Anion gap 26 (*)    All other components within normal limits  CBC WITH DIFFERENTIAL/PLATELET - Abnormal; Notable for the following:    Hemoglobin 9.9 (*)    HCT 31.2 (*)    MCH 25.3 (*)    RDW 16.7 (*)    All other components within normal limits  BLOOD GAS, VENOUS - Abnormal; Notable for the following:    pH, Ven 7.182 (*)    pCO2, Ven 20.7 (*)    Bicarbonate 7.5 (*)    Acid-base deficit 19.5 (*)    All other components within normal limits  URINE MICROSCOPIC-ADD ON - Abnormal; Notable for the following:    Squamous Epithelial / LPF 0-5 (*)    All other components within normal limits  CBG MONITORING, ED - Abnormal; Notable for the following:    Glucose-Capillary >600 (*)    All other components within normal limits  I-STAT CG4 LACTIC ACID, ED - Abnormal; Notable for the following:    Lactic Acid, Venous 3.48 (*)    All other components within normal limits  LIPASE, BLOOD  CBG MONITORING, ED    Imaging Review No results found. I have personally reviewed and evaluated these images and lab results as part of my medical decision-making.   EKG Interpretation None      MDM   Final diagnoses:  Diabetic ketoacidosis without coma associated with type 1 diabetes mellitus (HCC)  AKI  (acute kidney injury) (HCC)    POC CBG >600, lactic acid 3.48. Initial liter of NS bolus running, ordered 2nd liter. Will initiate glucostabilizer. Zofran and phenergan ordered as well.   CMP with Na 129 (suspect pseudohyponatremia), K+ 5.6, CO2 9, Cr 1.39, Anion Gap 26. CBC at baseline. No leukocytosis. UA shows ketones, glucose. Will call hospitalist for admission for DKA, AKI, lactic acidosis. VBG pending.   I spoke to Dr. Lovell Sheehan who will admit pt to tele. VBG now resulted with pH 7.18, pCO2 20.7, bicarb 7.5.  Carlene Coria, PA-C 01/22/16 9390  Lyndal Pulley, MD 01/22/16 [id number]

## 2016-01-21 NOTE — ED Notes (Signed)
Notified PA,Serena, pt. i-stat CG4 Lactic Acid 3.48.

## 2016-01-21 NOTE — ED Notes (Signed)
Per EMS- Vomiting earlier, polyuria, polydipsia.  Today CBG of 500. Reading high on glucometer. Tachycardia at 120, warm to touch. 100 degrees oral. BP of 96/54. HX of DM

## 2016-01-21 NOTE — ED Notes (Signed)
Bed: WA24 Expected date:  Expected time:  Means of arrival:  Comments: 85F emesis/high CBG 96/54

## 2016-01-21 NOTE — ED Notes (Signed)
VBG first attempt unsuccessful.  RN made aware, he sts he will try.

## 2016-01-22 DIAGNOSIS — R011 Cardiac murmur, unspecified: Secondary | ICD-10-CM | POA: Diagnosis present

## 2016-01-22 DIAGNOSIS — Z8249 Family history of ischemic heart disease and other diseases of the circulatory system: Secondary | ICD-10-CM | POA: Diagnosis not present

## 2016-01-22 DIAGNOSIS — E11649 Type 2 diabetes mellitus with hypoglycemia without coma: Secondary | ICD-10-CM | POA: Diagnosis present

## 2016-01-22 DIAGNOSIS — Z888 Allergy status to other drugs, medicaments and biological substances status: Secondary | ICD-10-CM | POA: Diagnosis not present

## 2016-01-22 DIAGNOSIS — E1010 Type 1 diabetes mellitus with ketoacidosis without coma: Secondary | ICD-10-CM | POA: Diagnosis not present

## 2016-01-22 DIAGNOSIS — D649 Anemia, unspecified: Secondary | ICD-10-CM | POA: Diagnosis not present

## 2016-01-22 DIAGNOSIS — I110 Hypertensive heart disease with heart failure: Secondary | ICD-10-CM | POA: Diagnosis present

## 2016-01-22 DIAGNOSIS — F419 Anxiety disorder, unspecified: Secondary | ICD-10-CM | POA: Diagnosis present

## 2016-01-22 DIAGNOSIS — R000 Tachycardia, unspecified: Secondary | ICD-10-CM

## 2016-01-22 DIAGNOSIS — Z952 Presence of prosthetic heart valve: Secondary | ICD-10-CM | POA: Diagnosis not present

## 2016-01-22 DIAGNOSIS — M069 Rheumatoid arthritis, unspecified: Secondary | ICD-10-CM | POA: Diagnosis present

## 2016-01-22 DIAGNOSIS — E1310 Other specified diabetes mellitus with ketoacidosis without coma: Principal | ICD-10-CM

## 2016-01-22 DIAGNOSIS — D638 Anemia in other chronic diseases classified elsewhere: Secondary | ICD-10-CM | POA: Diagnosis present

## 2016-01-22 DIAGNOSIS — N179 Acute kidney failure, unspecified: Secondary | ICD-10-CM | POA: Diagnosis present

## 2016-01-22 DIAGNOSIS — I252 Old myocardial infarction: Secondary | ICD-10-CM | POA: Diagnosis not present

## 2016-01-22 DIAGNOSIS — Z79899 Other long term (current) drug therapy: Secondary | ICD-10-CM | POA: Diagnosis not present

## 2016-01-22 DIAGNOSIS — K219 Gastro-esophageal reflux disease without esophagitis: Secondary | ICD-10-CM | POA: Diagnosis present

## 2016-01-22 DIAGNOSIS — Z823 Family history of stroke: Secondary | ICD-10-CM | POA: Diagnosis not present

## 2016-01-22 DIAGNOSIS — R4781 Slurred speech: Secondary | ICD-10-CM | POA: Diagnosis present

## 2016-01-22 DIAGNOSIS — E7800 Pure hypercholesterolemia, unspecified: Secondary | ICD-10-CM | POA: Diagnosis present

## 2016-01-22 DIAGNOSIS — Z8673 Personal history of transient ischemic attack (TIA), and cerebral infarction without residual deficits: Secondary | ICD-10-CM | POA: Diagnosis not present

## 2016-01-22 DIAGNOSIS — I255 Ischemic cardiomyopathy: Secondary | ICD-10-CM | POA: Diagnosis present

## 2016-01-22 DIAGNOSIS — R112 Nausea with vomiting, unspecified: Secondary | ICD-10-CM | POA: Diagnosis present

## 2016-01-22 DIAGNOSIS — E861 Hypovolemia: Secondary | ICD-10-CM | POA: Diagnosis present

## 2016-01-22 DIAGNOSIS — I10 Essential (primary) hypertension: Secondary | ICD-10-CM | POA: Diagnosis not present

## 2016-01-22 DIAGNOSIS — I5042 Chronic combined systolic (congestive) and diastolic (congestive) heart failure: Secondary | ICD-10-CM | POA: Diagnosis present

## 2016-01-22 DIAGNOSIS — Z8672 Personal history of thrombophlebitis: Secondary | ICD-10-CM | POA: Diagnosis not present

## 2016-01-22 DIAGNOSIS — Z7901 Long term (current) use of anticoagulants: Secondary | ICD-10-CM

## 2016-01-22 DIAGNOSIS — Z951 Presence of aortocoronary bypass graft: Secondary | ICD-10-CM | POA: Diagnosis not present

## 2016-01-22 DIAGNOSIS — I2510 Atherosclerotic heart disease of native coronary artery without angina pectoris: Secondary | ICD-10-CM | POA: Diagnosis present

## 2016-01-22 DIAGNOSIS — E1142 Type 2 diabetes mellitus with diabetic polyneuropathy: Secondary | ICD-10-CM | POA: Diagnosis present

## 2016-01-22 DIAGNOSIS — Z794 Long term (current) use of insulin: Secondary | ICD-10-CM | POA: Diagnosis not present

## 2016-01-22 DIAGNOSIS — Z7982 Long term (current) use of aspirin: Secondary | ICD-10-CM | POA: Diagnosis not present

## 2016-01-22 DIAGNOSIS — E785 Hyperlipidemia, unspecified: Secondary | ICD-10-CM | POA: Diagnosis present

## 2016-01-22 DIAGNOSIS — E119 Type 2 diabetes mellitus without complications: Secondary | ICD-10-CM | POA: Diagnosis not present

## 2016-01-22 DIAGNOSIS — F329 Major depressive disorder, single episode, unspecified: Secondary | ICD-10-CM | POA: Diagnosis present

## 2016-01-22 DIAGNOSIS — I509 Heart failure, unspecified: Secondary | ICD-10-CM

## 2016-01-22 LAB — BASIC METABOLIC PANEL
Anion gap: 19 — ABNORMAL HIGH (ref 5–15)
Anion gap: 8 (ref 5–15)
BUN: 17 mg/dL (ref 6–20)
BUN: 12 mg/dL (ref 6–20)
CHLORIDE: 105 mmol/L (ref 101–111)
CHLORIDE: 112 mmol/L — AB (ref 101–111)
CO2: 11 mmol/L — ABNORMAL LOW (ref 22–32)
CO2: 17 mmol/L — AB (ref 22–32)
CREATININE: 1.31 mg/dL — AB (ref 0.44–1.00)
CREATININE: 0.81 mg/dL (ref 0.44–1.00)
Calcium: 8.4 mg/dL — ABNORMAL LOW (ref 8.9–10.3)
Calcium: 7.9 mg/dL — ABNORMAL LOW (ref 8.9–10.3)
GFR calc Af Amer: 52 mL/min — ABNORMAL LOW (ref >60)
GFR calc Af Amer: >60 mL/min (ref >60)
GFR calc non Af Amer: 45 mL/min — ABNORMAL LOW (ref >60)
GFR calc non Af Amer: >60 mL/min (ref >60)
GLUCOSE: 348 mg/dL — AB (ref 65–99)
GLUCOSE: 109 mg/dL — AB (ref 65–99)
Potassium: 4.1 mmol/L (ref 3.5–5.1)
Potassium: 3.9 mmol/L (ref 3.5–5.1)
SODIUM: 135 mmol/L (ref 135–145)
SODIUM: 137 mmol/L (ref 135–145)

## 2016-01-22 LAB — BLOOD GAS, VENOUS
ACID-BASE DEFICIT: 19.5 mmol/L — AB (ref 0.0–2.0)
Bicarbonate: 7.5 mEq/L — ABNORMAL LOW (ref 20.0–24.0)
O2 CONTENT: 2 L/min
O2 Saturation: 65.2 %
PCO2 VEN: 20.7 mmHg — AB (ref 45.0–50.0)
PH VEN: 7.182 — AB (ref 7.250–7.300)
PO2 VEN: 43.4 mmHg (ref 30.0–45.0)
Patient temperature: 97.9
TCO2: 7.4 mmol/L (ref 0–100)

## 2016-01-22 LAB — GLUCOSE, CAPILLARY
GLUCOSE-CAPILLARY: 294 mg/dL — AB (ref 65–99)
GLUCOSE-CAPILLARY: 139 mg/dL — AB (ref 65–99)
GLUCOSE-CAPILLARY: 113 mg/dL — AB (ref 65–99)
GLUCOSE-CAPILLARY: 95 mg/dL (ref 65–99)
GLUCOSE-CAPILLARY: 113 mg/dL — AB (ref 65–99)
GLUCOSE-CAPILLARY: 143 mg/dL — AB (ref 65–99)
GLUCOSE-CAPILLARY: 188 mg/dL — AB (ref 65–99)
GLUCOSE-CAPILLARY: 178 mg/dL — AB (ref 65–99)
Glucose-Capillary: 240 mg/dL — ABNORMAL HIGH (ref 65–99)
Glucose-Capillary: 182 mg/dL — ABNORMAL HIGH (ref 65–99)
Glucose-Capillary: 192 mg/dL — ABNORMAL HIGH (ref 65–99)
Glucose-Capillary: 139 mg/dL — ABNORMAL HIGH (ref 65–99)

## 2016-01-22 LAB — FOLATE: FOLATE: 14 ng/mL (ref >5.9)

## 2016-01-22 LAB — RETICULOCYTES
RBC.: 3.84 MIL/uL — ABNORMAL LOW (ref 3.87–5.11)
RETIC COUNT ABSOLUTE: 73 10*3/uL (ref 19.0–186.0)
RETIC CT PCT: 1.9 % (ref 0.4–3.1)

## 2016-01-22 LAB — FERRITIN: Ferritin: 31 ng/mL (ref 11–307)

## 2016-01-22 LAB — MRSA PCR SCREENING: MRSA BY PCR: NEGATIVE

## 2016-01-22 LAB — IRON AND TIBC
Iron: 50 ug/dL (ref 28–170)
Saturation Ratios: 15 % (ref 10.4–31.8)
TIBC: 336 ug/dL (ref 250–450)
UIBC: 286 ug/dL

## 2016-01-22 LAB — PROTIME-INR
INR: 1.12 (ref 0.00–1.49)
Prothrombin Time: 14.6 seconds (ref 11.6–15.2)

## 2016-01-22 LAB — VITAMIN B12: VITAMIN B 12: 496 pg/mL (ref 180–914)

## 2016-01-22 MED ORDER — INSULIN ASPART 100 UNIT/ML ~~LOC~~ SOLN: 0.0000 [IU] | Freq: Every day | SUBCUTANEOUS | Status: DC

## 2016-01-22 MED ORDER — ASPIRIN EC 81 MG PO TBEC
81.0000 mg | DELAYED_RELEASE_TABLET | Freq: Every day | ORAL | Status: DC
  Administered 2016-01-22 – 2016-01-24 (×3): 81 mg via ORAL
  Filled 2016-01-22 (×3): qty 1

## 2016-01-22 MED ORDER — SODIUM CHLORIDE 0.9 % IV SOLN
INTRAVENOUS | Status: DC
  Administered 2016-01-22: 07:00:00 via INTRAVENOUS

## 2016-01-22 MED ORDER — ESCITALOPRAM OXALATE 20 MG PO TABS
20.0000 mg | ORAL_TABLET | Freq: Every day | ORAL | Status: DC
  Administered 2016-01-22 – 2016-01-24 (×3): 20 mg via ORAL
  Filled 2016-01-22 (×3): qty 1

## 2016-01-22 MED ORDER — WARFARIN SODIUM 7.5 MG PO TABS
7.5000 mg | ORAL_TABLET | Freq: Every day | ORAL | Status: DC
  Administered 2016-01-22 – 2016-01-23 (×2): 7.5 mg via ORAL
  Filled 2016-01-22 (×3): qty 1

## 2016-01-22 MED ORDER — ACETAMINOPHEN 325 MG PO TABS: 650.0000 mg | ORAL_TABLET | ORAL | Status: DC | PRN

## 2016-01-22 MED ORDER — ENOXAPARIN SODIUM 100 MG/ML ~~LOC~~ SOLN
1.5000 mg/kg | SUBCUTANEOUS | Status: DC
  Administered 2016-01-22: 100 mg via SUBCUTANEOUS
  Filled 2016-01-22 (×2): qty 1

## 2016-01-22 MED ORDER — DARIFENACIN HYDROBROMIDE ER 7.5 MG PO TB24
7.5000 mg | ORAL_TABLET | Freq: Every day | ORAL | Status: DC
  Administered 2016-01-22 – 2016-01-24 (×3): 7.5 mg via ORAL
  Filled 2016-01-22 (×3): qty 1

## 2016-01-22 MED ORDER — LISINOPRIL 2.5 MG PO TABS: 2.5000 mg | ORAL_TABLET | Freq: Every day | ORAL | Status: DC

## 2016-01-22 MED ORDER — SODIUM CHLORIDE 0.9 % IV SOLN
INTRAVENOUS | Status: AC
  Administered 2016-01-22: 02:00:00 via INTRAVENOUS

## 2016-01-22 MED ORDER — SODIUM CHLORIDE 0.9 % IV SOLN
INTRAVENOUS | Status: DC
  Filled 2016-01-22: qty 2.5

## 2016-01-22 MED ORDER — ONDANSETRON HCL 4 MG/2ML IJ SOLN
4.0000 mg | Freq: Four times a day (QID) | INTRAMUSCULAR | Status: DC | PRN
  Administered 2016-01-22: 4 mg via INTRAVENOUS
  Filled 2016-01-22: qty 2

## 2016-01-22 MED ORDER — SODIUM CHLORIDE 0.9 % IV SOLN
INTRAVENOUS | Status: DC
  Administered 2016-01-22 – 2016-01-23 (×3): via INTRAVENOUS

## 2016-01-22 MED ORDER — INSULIN ASPART 100 UNIT/ML ~~LOC~~ SOLN
0.0000 [IU] | Freq: Three times a day (TID) | SUBCUTANEOUS | Status: DC
  Administered 2016-01-22: 2 [IU] via SUBCUTANEOUS
  Administered 2016-01-22: 1 [IU] via SUBCUTANEOUS
  Administered 2016-01-23: 3 [IU] via SUBCUTANEOUS
  Administered 2016-01-23: 2 [IU] via SUBCUTANEOUS

## 2016-01-22 MED ORDER — INSULIN GLARGINE 100 UNIT/ML ~~LOC~~ SOLN
15.0000 [IU] | Freq: Every day | SUBCUTANEOUS | Status: DC
  Administered 2016-01-22 – 2016-01-23 (×2): 15 [IU] via SUBCUTANEOUS
  Filled 2016-01-22 (×2): qty 0.15

## 2016-01-22 MED ORDER — DEXTROSE-NACL 5-0.45 % IV SOLN
INTRAVENOUS | Status: DC
  Administered 2016-01-22: 06:00:00 via INTRAVENOUS

## 2016-01-22 MED ORDER — SODIUM CHLORIDE 0.9 % IV SOLN
INTRAVENOUS | Status: DC
  Filled 2016-01-22: qty 1000

## 2016-01-22 MED ORDER — PRAVASTATIN SODIUM 20 MG PO TABS
20.0000 mg | ORAL_TABLET | Freq: Every day | ORAL | Status: DC
  Administered 2016-01-22 – 2016-01-24 (×3): 20 mg via ORAL
  Filled 2016-01-22 (×3): qty 1

## 2016-01-22 MED ORDER — PANTOPRAZOLE SODIUM 40 MG PO TBEC
40.0000 mg | DELAYED_RELEASE_TABLET | Freq: Every day | ORAL | Status: DC
Start: 2016-01-22 — End: 2016-01-24
  Administered 2016-01-22 – 2016-01-24 (×3): 40 mg via ORAL
  Filled 2016-01-22 (×3): qty 1

## 2016-01-22 MED ORDER — WARFARIN - PHARMACIST DOSING INPATIENT: Freq: Every day | Status: DC

## 2016-01-22 MED ORDER — ENOXAPARIN SODIUM 80 MG/0.8ML ~~LOC~~ SOLN: 1.0000 mg/kg | Freq: Two times a day (BID) | SUBCUTANEOUS | Status: DC

## 2016-01-22 NOTE — Progress Notes (Signed)
ANTICOAGULATION CONSULT NOTE  Pharmacy Consult for Warfarin/Lovenox Indication: aortic valve replacement w/ low INR  Allergies  Allergen Reactions  . Other Other (See Comments)    Can burn skin if left on too long.TAPE  . Celebrex [Celecoxib] Rash  . Detrol [Tolterodine] Hives    Patient Measurements: Height: 5\' 3"  (160 cm) Weight: 145 lb 11.6 oz (66.1 kg) IBW/kg (Calculated) : 52.4  Vital Signs: Temp: 98.1 F (36.7 C) (03/05 0423) Temp Source: Oral (03/05 0423) BP: 107/42 mmHg (03/05 0900) Pulse Rate: 97 (03/05 0900)  Labs:  Recent Labs  01/21/16 2257 01/22/16 0211 01/22/16 0215 01/22/16 0715  HGB 9.9*  --   --   --   HCT 31.2*  --   --   --   PLT 308  --   --   --   LABPROT  --   --  14.6  --   INR  --   --  1.12  --   CREATININE 1.39* 1.31*  --  0.81    Estimated Creatinine Clearance: 71.7 mL/min (by C-G formula based on Cr of 0.81).  Medications:  Prescriptions prior to admission  Medication Sig Dispense Refill Last Dose  . acetaminophen (TYLENOL) 325 MG tablet Take 2 tablets (650 mg total) by mouth every 6 (six) hours as needed for mild pain (temp > 101.5).   Past Month at Unknown time  . aspirin 81 MG tablet Take 1 tablet (81 mg total) by mouth daily. 30 tablet  01/21/2016 at Unknown time  . escitalopram (LEXAPRO) 20 MG tablet Take 1 tablet (20 mg total) by mouth daily. 30 tablet 6 01/21/2016 at Unknown time  . insulin aspart protamine - aspart (NOVOLOG MIX 70/30 FLEXPEN) (70-30) 100 UNIT/ML FlexPen Inject 0.45 mLs (45 Units total) into the skin 2 (two) times daily. And pen needles 1/day 15 mL 11 01/21/2016 at Unknown time  . lisinopril (PRINIVIL,ZESTRIL) 2.5 MG tablet    01/21/2016 at Unknown time  . omeprazole (PRILOSEC) 40 MG capsule TAKE 1 CAPSULE (40 MG TOTAL) BY MOUTH DAILY. 90 capsule 1 01/21/2016 at Unknown time  . ondansetron (ZOFRAN) 4 MG tablet Take 1 tablet (4 mg total) by mouth every 8 (eight) hours as needed for nausea or vomiting. 20 tablet 0 01/21/2016 at  Unknown time  . pravastatin (PRAVACHOL) 20 MG tablet Take 1 tablet (20 mg total) by mouth daily. 90 tablet 0 01/21/2016 at Unknown time  . solifenacin (VESICARE) 5 MG tablet Take 1 tablet (5 mg total) by mouth daily. 90 tablet 2 01/21/2016 at Unknown time  . warfarin (COUMADIN) 5 MG tablet Take 7.5 mg by mouth daily.   01/21/2016 at 8pm   Scheduled:  . aspirin EC  81 mg Oral Daily  . darifenacin  7.5 mg Oral Daily  . escitalopram  20 mg Oral Daily  . insulin aspart  0-5 Units Subcutaneous QHS  . insulin aspart  0-9 Units Subcutaneous TID WC  . insulin glargine  15 Units Subcutaneous Daily  . pantoprazole  40 mg Oral Daily  . pravastatin  20 mg Oral Daily  . warfarin  7.5 mg Oral q1800  . Warfarin - Pharmacist Dosing Inpatient   Does not apply q1800   Assessment: 67 yoF admitted for DKA.  PMH AVR on warfarin, CAD s/p CABG, CVA, CHF, DM, HTN.  Found to have low INR.  Warfarin restarted with Lovenox bridge per pharmacy.   Baseline INR: low at 1.12  Prior anticoagulation: warfarin 7.5 mg daily  Significant  events:  Today, 01/22/2016:  CBC: Hgb low yesterday; Plt wnl  INR subtherapeutic  AKI resolved, CrCl 71 ml/min  Major drug interactions: none, also on ASA 81 mg  No bleeding issues per nursing  Carb mod diet  Goal of Therapy: INR 2-3  Plan:  Warfarin already given today; ordered 7.5 mg daily  Begin Lovenox 100 mg SQ q24 hr  Daily INR  CBC at least q72 hr while on warfarin  F/u SCr  Monitor for signs of bleeding or thrombosis   Bernadene Person, PharmD Pager: [phone] 01/22/2016, 10:56 AM

## 2016-01-22 NOTE — Progress Notes (Signed)
PROGRESS NOTE  Madison [name] DGL:[id number] DOB: 09-27-1961 DOA: 01/21/2016 PCP: [name], NP  Brief History 55 year old female with a history of diabetes mellitus type 2, CAD, h/o strokes (residual slurred/slowed speech), h/o AVR on warfarin, coronary artery disease status post CABG, stroke with left Middle purses, systolic and diastolic CHF, and hypertension presents with 2 day history of nausea and vomiting.  She states her sugars had been low earlier this week (in the 50s) but then today it was 502 when she checked it prior to EMS arrival.  Interestingly, she endorses compliance with her insulin. So also endorses polydipsia and polyuria urea. According to telephone notes, the patient's 70/30 insulin was decreased to 35 units every morning on 01/19/2016. Prior to that, the patient was taking 70/30--45 units in the morning. The patient was noted to have serum glucose of 750 one emergency department with anion gap of 26. She was started on intravenous fluids and intravenous insulin. She denies any recent fevers, chills, shortness breath, chest pain, dysuria, hematuria, headache, neck pain, rashes. Assessment/Plan: DKA -Initial anion gap 26 with serum glucose of 51 -Anion gap has closed -Transition to subcutaneous insulin -start Lantus today with the understanding she takes 70/30 as outpt -Continue intravenous fluids for now -Question patient compliance -11/23/2015 hemoglobin A1c 12.3 -Recheck A1c - Patient follows with Dr. 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 First diagnosed 79 and has been on insulin's since. - As per review of his records, she was on insulin pump treatment until CVAs prevented her from using the device in early 2014, in early 2015 she was changed to a simple insulin schedule, due to poor results with multiple daily injections. In 2015, she change from Levemir up NPH due to the pattern of  her CBG's. She has been on 70/30 45 units in AM's prior to admission Chronic systolic and diastolic CHF/ischemic cardiomyopathy/ -08/01/2015 echo--EF 45-50, grade 2 DD,Hypokinesis of the mid-apicalanteroseptal and anterior myocardium -Clinically hypovolemic -Daily weights Acute kidney injury -Secondary to volume depletion -Based on creatinine 0.6-0.8 Mechanical AVR -INR 1.12 -Lovenox bridge -PharmD to assist with coumadin Coronary artery disease s/p CABG -No anginal symptoms presently -Continue aspirin -Continue statin Hyperlipidemia  -Continue statin  Rheumatoid Arthritis - does not take Rheumatrex at home Prior CVA - Continue Coumadin anticoagulation and low-dose aspirin.  Family Communication:   No family at beside Disposition Plan:   Home 1-2 days      Procedures/Studies:  No results found.      Subjective: Patient denies fevers, chills, headache, chest pain, dyspnea, nausea, vomiting, diarrhea, abdominal pain, dysuria, hematuria   Objective: Filed Vitals:   01/22/16 0500 01/22/16 0630 01/22/16 0723 01/22/16 0900  BP:  92/39 121/44 107/42  Pulse:  95 82 97  Temp:      TempSrc:      Resp:  19 21 19   Height: 5\' 3"  (1.6 m)     Weight: 66.1 kg (145 lb 11.6 oz)     SpO2:  97% 100% 99%    Intake/Output Summary (Last 24 hours) at 01/22/16 1040 Last data filed at 01/22/16 1000  Gross per 24 hour  Intake 474.78 ml  Output    500 ml  Net -25.22 ml   Weight change:  Exam:   General:  Pt is alert, follows commands appropriately, not in acute distress  HEENT: No icterus, No thrush, No neck mass, [place]/AT  Cardiovascular: RRR, S1/S2,  no rubs, no gallops  Respiratory: Bibasilar rales. No wheezing.   Abdomen: Soft/+BS, non tender, non distended, no guarding  Extremities: No edema, No lymphangitis, No petechiae, No rashes, no synovitis  Data Reviewed: Basic Metabolic Panel:  Recent Labs Lab 01/21/16 2257 01/22/16 0211 01/22/16 0715  NA 129* 135  137  K 5.6* 4.1 3.9  CL 94* 105 112*  CO2 9* 11* 17*  GLUCOSE 751* 348* 109*  BUN 17 17 12   CREATININE 1.39* 1.31* 0.81  CALCIUM 9.3 8.4* 7.9*   Liver Function Tests:  Recent Labs Lab 01/21/16 2257  AST 17  ALT 16  ALKPHOS 91  BILITOT 1.7*  PROT 6.9  ALBUMIN 3.6    Recent Labs Lab 01/21/16 2257  LIPASE 15   No results for input(s): AMMONIA in the last 168 hours. CBC:  Recent Labs Lab 01/21/16 2257  WBC 9.8  NEUTROABS 7.4  HGB 9.9*  HCT 31.2*  MCV 79.8  PLT 308   Cardiac Enzymes: No results for input(s): CKTOTAL, CKMB, CKMBINDEX, TROPONINI in the last 168 hours. BNP: Invalid input(s): POCBNP CBG:  Recent Labs Lab 01/22/16 0316 01/22/16 0415 01/22/16 0518 01/22/16 0627 01/22/16 0809  GLUCAP 240* 182* 139* 113* 113*    Recent Results (from the past 240 hour(s))  MRSA PCR Screening     Status: None   Collection Time: 01/22/16  1:26 AM  Result Value Ref Range Status   MRSA by PCR NEGATIVE NEGATIVE Final    Comment:        The GeneXpert MRSA Assay (FDA approved for NASAL specimens only), is one component of a comprehensive MRSA colonization surveillance program. It is not intended to diagnose MRSA infection nor to guide or monitor treatment for MRSA infections.      Scheduled Meds: . aspirin EC  81 mg Oral Daily  . darifenacin  7.5 mg Oral Daily  . escitalopram  20 mg Oral Daily  . insulin aspart  0-5 Units Subcutaneous QHS  . insulin aspart  0-9 Units Subcutaneous TID WC  . insulin glargine  15 Units Subcutaneous Daily  . pantoprazole  40 mg Oral Daily  . pravastatin  20 mg Oral Daily  . warfarin  7.5 mg Oral q1800  . Warfarin - Pharmacist Dosing Inpatient   Does not apply q1800   Continuous Infusions: . sodium chloride       Derry Kassel, DO  [facility] Pager [phone]  If 7PM-7AM, please contact night-coverage [email or web address] Password [email or web address] 01/22/2016, 10:40 AM   LOS: 0 days

## 2016-01-22 NOTE — Progress Notes (Signed)
ANTICOAGULATION CONSULT NOTE - Initial Consult  Pharmacy Consult for Warfarin Indication: aortic valve replacement  Allergies  Allergen Reactions  . Other Other (See Comments)    Can burn skin if left on too long.TAPE  . Celebrex [Celecoxib] Rash  . Detrol [Tolterodine] Hives    Patient Measurements:   Heparin Dosing Weight:   Vital Signs: Temp: 98.1 F (36.7 C) (03/05 0423) Temp Source: Oral (03/05 0423) BP: 130/74 mmHg (03/05 0030) Pulse Rate: 131 (03/05 0030)  Labs:  Recent Labs  01/21/16 2257 01/22/16 0211 01/22/16 0215  HGB 9.9*  --   --   HCT 31.2*  --   --   PLT 308  --   --   LABPROT  --   --  14.6  INR  --   --  1.12  CREATININE 1.39* 1.31*  --     CrCl cannot be calculated (Unknown ideal weight.).   Medical History: Past Medical History  Diagnosis Date  . CAD (coronary artery disease)   . Obesity   . Hypercholesteremia   . HTN (hypertension)   . Dyslipidemia   . Aortic stenosis   . Thrombophlebitis   . Heart murmur   . Myocardial infarction (HCC) 12/2014  . Pneumonia 11/2014; 12/2014  . GERD (gastroesophageal reflux disease)   . Stroke syndrome [facility]) 1995; 2001    "when my son was born; problems w/speech and L hand since then" (01/19/2015)  . Anxiety   . Depression   . Allergy   . CHF (congestive heart failure) (HCC)   . Rheumatoid arthritis(714.0)     "hands" (07/01/2015)  . Diabetes (HCC) dx'd 1982    "went straight to insulin"    Medications:  Prescriptions prior to admission  Medication Sig Dispense Refill Last Dose  . acetaminophen (TYLENOL) 325 MG tablet Take 2 tablets (650 mg total) by mouth every 6 (six) hours as needed for mild pain (temp > 101.5).   Past Month at Unknown time  . aspirin 81 MG tablet Take 1 tablet (81 mg total) by mouth daily. 30 tablet  01/21/2016 at Unknown time  . escitalopram (LEXAPRO) 20 MG tablet Take 1 tablet (20 mg total) by mouth daily. 30 tablet 6 01/21/2016 at Unknown time  . insulin aspart protamine -  aspart (NOVOLOG MIX 70/30 FLEXPEN) (70-30) 100 UNIT/ML FlexPen Inject 0.45 mLs (45 Units total) into the skin 2 (two) times daily. And pen needles 1/day 15 mL 11 01/21/2016 at Unknown time  . lisinopril (PRINIVIL,ZESTRIL) 2.5 MG tablet    01/21/2016 at Unknown time  . omeprazole (PRILOSEC) 40 MG capsule TAKE 1 CAPSULE (40 MG TOTAL) BY MOUTH DAILY. 90 capsule 1 01/21/2016 at Unknown time  . ondansetron (ZOFRAN) 4 MG tablet Take 1 tablet (4 mg total) by mouth every 8 (eight) hours as needed for nausea or vomiting. 20 tablet 0 01/21/2016 at Unknown time  . pravastatin (PRAVACHOL) 20 MG tablet Take 1 tablet (20 mg total) by mouth daily. 90 tablet 0 01/21/2016 at Unknown time  . solifenacin (VESICARE) 5 MG tablet Take 1 tablet (5 mg total) by mouth daily. 90 tablet 2 01/21/2016 at Unknown time  . warfarin (COUMADIN) 5 MG tablet Take 7.5 mg by mouth daily.   01/21/2016 at 8pm   Scheduled:  . aspirin EC  81 mg Oral Daily  . darifenacin  7.5 mg Oral Daily  . escitalopram  20 mg Oral Daily  . lisinopril  2.5 mg Oral Daily  . pantoprazole  40 mg Oral  Daily  . pravastatin  20 mg Oral Daily  . warfarin  7.5 mg Oral q1800  . Warfarin - Pharmacist Dosing Inpatient   Does not apply q1800    Assessment: Patient with chronic warfarin for aortic valve replacement.  INR on admit < 1.5.    Goal of Therapy:  INR 2-3    Plan:  Warfarin 7.5mg  po x1 now and then daily at 1800. Daily INR  Aleene Davidson Crowford 01/22/2016,6:24 AM

## 2016-01-22 NOTE — H&P (Addendum)
[facility] Admission History and Physical       Madison [name] ASN:[id number] DOB: [birth year] DOA: 01/21/2016  Referring physician: EDP PCP: [name], NP  Specialists:   Chief Complaint: Increased Glucose  HPI: Madison [name] is a 55 y.o. female with a history of CVA's,  RA, AS, S/P AVR on Coumadin Rx, CAD, HTN, IDDM and Hyperlipidemia who presents to the ED with complaints of polyuria, polydipsia, and Nausea and Vomiting x 2 days.  She reports having bouts of hypoglycemia at home, and this evening, her glucose was critically high.   She was found to have a glucose level of 751 in the ED, and was found to have an Anion Gap of 26, and was placed on the DKA protocol with IV Insulin.   She was referred for admission.    Review of Systems:  Constitutional: No Weight Loss, No Weight Gain, Night Sweats, Fevers, Chills, Dizziness, Light Headedness, Fatigue, or Generalized Weakness HEENT: No Headaches, Difficulty Swallowing,Tooth/Dental Problems,Sore Throat,  No Sneezing, Rhinitis, Ear Ache, Nasal Congestion, or Post Nasal Drip,  Cardio-vascular:  No Chest pain, Orthopnea, PND, Edema in Lower Extremities, Anasarca, Dizziness, Palpitations  Resp: No Dyspnea, No DOE, No Productive Cough, No Non-Productive Cough, No Hemoptysis, No Wheezing.    GI: No Heartburn, Indigestion, Abdominal Pain, +Nausea, Vomiting, Diarrhea, Constipation, Hematemesis, Hematochezia, Melena, Change in Bowel Habits,  Loss of Appetite  GU: No Dysuria, No Change in Color of Urine, +Urinary Frequency, No Flank pain.  Musculoskeletal: No Joint Pain or Swelling, No Decreased Range of Motion, No Back Pain.  Neurologic: No Syncope, No Seizures, Muscle Weakness, Paresthesia, Vision Disturbance or Loss, No Diplopia, No Vertigo, No Difficulty Walking,  Skin: No Rash or Lesions. Psych: No Change in Mood or Affect, No Depression or Anxiety, No Memory loss, No Confusion, or Hallucinations   Past Medical History  Diagnosis  Date  . CAD (coronary artery disease)   . Obesity   . Hypercholesteremia   . HTN (hypertension)   . Dyslipidemia   . Aortic stenosis   . Thrombophlebitis   . Heart murmur   . Myocardial infarction (HCC) 12/2014  . Pneumonia 11/2014; 12/2014  . GERD (gastroesophageal reflux disease)   . Stroke syndrome [facility]) 1995; 2001    "when my son was born; problems w/speech and L hand since then" (01/19/2015)  . Anxiety   . Depression   . Allergy   . CHF (congestive heart failure) (HCC)   . Rheumatoid arthritis(714.0)     "hands" (07/01/2015)  . Diabetes (HCC) dx'd 1982    "went straight to insulin"     Past Surgical History  Procedure Laterality Date  . Cesarean section  1995  . Foot fracture surgery    . Knee arthroscopy Right   . Neuroplasty / transposition median nerve at carpal tunnel    . Left heart catheterization with coronary angiogram N/A 12/08/2014    Procedure: LEFT HEART CATHETERIZATION WITH CORONARY ANGIOGRAM;  Surgeon: Iran Ouch, MD;  Location: [facility] CATH LAB;  Service: Cardiovascular;  Laterality: N/A;  . Percutaneous coronary stent intervention (pci-s) N/A 12/09/2014    Procedure: PERCUTANEOUS CORONARY STENT INTERVENTION (PCI-S);  Surgeon: [name], MD;  Location: [facility] CATH LAB;  Service: Cardiovascular;  Laterality: N/A;  . Cardiac catheterization  12/08/2014  . Coronary angioplasty with stent placement  12/09/2014  . Aortic valve replacement (avr)/coronary artery bypass grafting (cabg)  "2014"    [name] 03/08/2014  . Fracture surgery    . Tubal ligation  1995  . Cataract extraction Left   . Coronary artery bypass graft    . Orif ankle fracture Left 07/04/2015    Procedure: OPEN REDUCTION INTERNAL FIXATION (ORIF)  TRIMAL ANKLE FRACTURE;  Surgeon: [name], MD;  Location: [facility] OR;  Service: Orthopedics;  Laterality: Left;      Prior to Admission medications   Medication Sig Start Date End Date Taking? Authorizing Provider  acetaminophen (TYLENOL) 325 MG tablet  Take 2 tablets (650 mg total) by mouth every 6 (six) hours as needed for mild pain (temp > 101.5). 08/09/15  Yes Starleen Arms, MD  aspirin 81 MG tablet Take 1 tablet (81 mg total) by mouth daily. 10/11/14  Yes Leana Roe Elgergawy, MD  escitalopram (LEXAPRO) 20 MG tablet Take 1 tablet (20 mg total) by mouth daily. 11/03/15  Yes [name], NP  insulin aspart protamine - aspart (NOVOLOG MIX 70/30 FLEXPEN) (70-30) 100 UNIT/ML FlexPen Inject 0.45 mLs (45 Units total) into the skin 2 (two) times daily. And pen needles 1/day 01/10/16  Yes Romero Belling, MD  lisinopril (PRINIVIL,ZESTRIL) 2.5 MG tablet  10/22/15  Yes Historical Provider, MD  omeprazole (PRILOSEC) 40 MG capsule TAKE 1 CAPSULE (40 MG TOTAL) BY MOUTH DAILY. 09/12/15  Yes [name], NP  ondansetron (ZOFRAN) 4 MG tablet Take 1 tablet (4 mg total) by mouth every 8 (eight) hours as needed for nausea or vomiting. 01/04/16  Yes [name], NP  pravastatin (PRAVACHOL) 20 MG tablet Take 1 tablet (20 mg total) by mouth daily. 08/17/15  Yes [name], NP  solifenacin (VESICARE) 5 MG tablet Take 1 tablet (5 mg total) by mouth daily. 05/03/15  Yes [name], NP  warfarin (COUMADIN) 5 MG tablet Take 7.5 mg by mouth daily.   Yes Historical Provider, MD     Allergies  Allergen Reactions  . Other Other (See Comments)    Can burn skin if left on too long.TAPE  . Celebrex [Celecoxib] Rash  . Detrol [Tolterodine] Hives    Social History:  reports that she has never smoked. She has never used smokeless tobacco. She reports that she does not drink alcohol or use illicit drugs.    Family History  Problem Relation Age of Onset  . Stroke    . Heart disease Mother   . Heart disease Sister        Physical Exam:  GEN:  Pleasant Well Nourished And Well Developed  55 y.o. female examined and in no acute distress; cooperative with exam Filed Vitals:   01/21/16 2226 01/22/16 0010  BP: 114/51 122/47  Pulse: 112 107  Temp: 97.9 F (36.6  C) 97.4 F (36.3 C)  TempSrc: Oral Oral  Resp: 19 19  SpO2: 100% 98%   Blood pressure 122/47, pulse 107, temperature 97.4 F (36.3 C), temperature source Oral, resp. rate 19, SpO2 98 %. PSYCH: She is alert and oriented x4; does not appear anxious does not appear depressed; affect is normal HEENT: Normocephalic and Atraumatic, Mucous membranes pink; PERRLA; EOM intact; Fundi:  Benign;  No scleral icterus, Nares: Patent, Oropharynx: Clear, Edentulous with Dentures Present,    Neck:  FROM, No Cervical Lymphadenopathy nor Thyromegaly or Carotid Bruit; No JVD; Breasts:: Not examined CHEST WALL: No tenderness CHEST: Normal respiration, clear to auscultation bilaterally HEART: Regular rate and rhythm; no murmurs rubs or gallops BACK: No kyphosis or scoliosis; No CVA tenderness ABDOMEN: Positive Bowel Sounds, Soft Non-Tender, No Rebound or Guarding; No Masses, No Organomegaly Rectal Exam: Not done  EXTREMITIES: No Cyanosis, Clubbing, or Edema; No Ulcerations. Genitalia: not examined PULSES: 2+ and symmetric SKIN: Normal hydration no rash or ulceration CNS:  Alert and Oriented x 4, No Focal Deficits Vascular: pulses palpable throughout    Labs on Admission:  Basic Metabolic Panel:  Recent Labs Lab 01/21/16 2257  NA 129*  K 5.6*  CL 94*  CO2 9*  GLUCOSE 751*  BUN 17  CREATININE 1.39*  CALCIUM 9.3   Liver Function Tests:  Recent Labs Lab 01/21/16 2257  AST 17  ALT 16  ALKPHOS 91  BILITOT 1.7*  PROT 6.9  ALBUMIN 3.6    Recent Labs Lab 01/21/16 2257  LIPASE 15   No results for input(s): AMMONIA in the last 168 hours. CBC:  Recent Labs Lab 01/21/16 2257  WBC 9.8  NEUTROABS 7.4  HGB 9.9*  HCT 31.2*  MCV 79.8  PLT 308   Cardiac Enzymes: No results for input(s): CKTOTAL, CKMB, CKMBINDEX, TROPONINI in the last 168 hours.  BNP (last 3 results)  Recent Labs  07/30/15 1400  BNP 398.4*    ProBNP (last 3 results) No results for input(s): PROBNP in the last  8760 hours.  CBG:  Recent Labs Lab 01/21/16 2314  GLUCAP >600*    Radiological Exams on Admission: No results found.   EKG: Independently reviewed. Sinus Tachycardia rate = 106   Assessment/Plan:      55 y.o. female with   Principal Problem:    DKA (diabetic ketoacidoses) (HCC)    DKA Protocol with IV Insulin Drip    IVFs until Anion Gap closes    Monitor Electrolytes   Active Problems:    Sinus tachycardia (HCC)    IVFs    Cardiac Monitoring     AKI (acute kidney injury) (HCC)    IVFs    Monitor BUN/Cr    Hold Lisinopril Rx      CAD (coronary artery disease)    Cardiac monitoring    Contninue Pravastatin, and ASA Rx          CHF (congestive heart failure) (HCC)    Patient Denies current history    Monitor I/Os      Chronic anticoagulation    Continue Warfarin Rx    Check PT/INR daily and adjust PRN      Essential hypertension    On Lisinopril     Holding due to AKI    Monitor BPs      Normocytic anemia     Send Anemia Panel      Hyperlipidemia    Continue Pravastatin Rx    Rheumatoid arthritis (HCC)    Chronic    DVT Prophylaxis    On Warfarin Rx     Code Status:     FULL CODE  Family Communication:   No Family Present    Disposition Plan:    Inpatient Status        Time spent:  54 Minutes      Ron Parker [facility] Pager [phone]   If 7AM -7PM Please Contact the Day Rounding Team MD for [facility]  If 7PM-7AM, Please Contact Night-Floor Coverage  [email or web address] Password [email or web address] 01/22/2016, 12:12 AM     ADDENDUM:   Patient was seen and examined on 01/22/2016

## 2016-01-23 DIAGNOSIS — E785 Hyperlipidemia, unspecified: Secondary | ICD-10-CM

## 2016-01-23 DIAGNOSIS — D649 Anemia, unspecified: Secondary | ICD-10-CM

## 2016-01-23 LAB — CBC
HEMATOCRIT: 28.9 % — AB (ref 36.0–46.0)
HEMOGLOBIN: 9.1 g/dL — AB (ref 12.0–15.0)
MCH: 25.1 pg — ABNORMAL LOW (ref 26.0–34.0)
MCHC: 31.5 g/dL (ref 30.0–36.0)
MCV: 79.8 fL (ref 78.0–100.0)
Platelets: 235 10*3/uL (ref 150–400)
RBC: 3.62 MIL/uL — ABNORMAL LOW (ref 3.87–5.11)
RDW: 17.2 % — AB (ref 11.5–15.5)
WBC: 6.6 10*3/uL (ref 4.0–10.5)

## 2016-01-23 LAB — BASIC METABOLIC PANEL
ANION GAP: 4 — AB (ref 5–15)
CALCIUM: 8.1 mg/dL — AB (ref 8.9–10.3)
CO2: 21 mmol/L — AB (ref 22–32)
Chloride: 111 mmol/L (ref 101–111)
Creatinine, Ser: 0.71 mg/dL (ref 0.44–1.00)
GFR calc Af Amer: >60 mL/min (ref >60)
GLUCOSE: 211 mg/dL — AB (ref 65–99)
Potassium: 3.7 mmol/L (ref 3.5–5.1)
Sodium: 136 mmol/L (ref 135–145)

## 2016-01-23 LAB — GLUCOSE, CAPILLARY
GLUCOSE-CAPILLARY: 254 mg/dL — AB (ref 65–99)
GLUCOSE-CAPILLARY: 230 mg/dL — AB (ref 65–99)
Glucose-Capillary: 175 mg/dL — ABNORMAL HIGH (ref 65–99)
Glucose-Capillary: 284 mg/dL — ABNORMAL HIGH (ref 65–99)

## 2016-01-23 LAB — PROTIME-INR
INR: 1.59 — AB (ref 0.00–1.49)
PROTHROMBIN TIME: 19 s — AB (ref 11.6–15.2)

## 2016-01-23 MED ORDER — INSULIN ASPART PROT & ASPART (70-30 MIX) 100 UNIT/ML ~~LOC~~ SUSP
25.0000 [IU] | Freq: Two times a day (BID) | SUBCUTANEOUS | Status: DC
  Administered 2016-01-24: 25 [IU] via SUBCUTANEOUS
  Filled 2016-01-23: qty 10

## 2016-01-23 MED ORDER — INSULIN ASPART 100 UNIT/ML ~~LOC~~ SOLN
8.0000 [IU] | Freq: Once | SUBCUTANEOUS | Status: AC
  Administered 2016-01-23: 8 [IU] via SUBCUTANEOUS

## 2016-01-23 MED ORDER — INSULIN ASPART 100 UNIT/ML ~~LOC~~ SOLN
0.0000 [IU] | Freq: Three times a day (TID) | SUBCUTANEOUS | Status: DC
  Administered 2016-01-24: 2 [IU] via SUBCUTANEOUS

## 2016-01-23 MED ORDER — WARFARIN SODIUM 7.5 MG PO TABS
7.5000 mg | ORAL_TABLET | Freq: Once | ORAL | Status: DC
  Filled 2016-01-23: qty 1

## 2016-01-23 MED ORDER — INSULIN ASPART 100 UNIT/ML ~~LOC~~ SOLN
0.0000 [IU] | Freq: Every day | SUBCUTANEOUS | Status: DC
  Administered 2016-01-23: 2 [IU] via SUBCUTANEOUS

## 2016-01-23 MED ORDER — ENOXAPARIN SODIUM 80 MG/0.8ML ~~LOC~~ SOLN
65.0000 mg | Freq: Two times a day (BID) | SUBCUTANEOUS | Status: DC
  Administered 2016-01-23 – 2016-01-24 (×2): 65 mg via SUBCUTANEOUS
  Filled 2016-01-23 (×3): qty 0.8

## 2016-01-23 MED ORDER — ENOXAPARIN SODIUM 80 MG/0.8ML ~~LOC~~ SOLN
65.0000 mg | Freq: Once | SUBCUTANEOUS | Status: AC
  Administered 2016-01-23: 65 mg via SUBCUTANEOUS
  Filled 2016-01-23: qty 0.8

## 2016-01-23 NOTE — Progress Notes (Signed)
PROGRESS NOTE  Madison [name] RUE:[id number] DOB: 17-Jul-1961 DOA: 01/21/2016 PCP: [name], NP    Brief History 55 year old female with a history of diabetes mellitus type 2, CAD, h/o strokes (residual slurred/slowed speech), h/o AVR on warfarin, coronary artery disease status post CABG, stroke with left Middle purses, systolic and diastolic CHF, and hypertension presents with 2 day history of nausea and vomiting. She states her sugars had been low earlier this week (in the 50s) but then today it was 502 when she checked it prior to EMS arrival. Interestingly, she endorses compliance with her insulin. So also endorses polydipsia and polyuria urea. According to telephone notes, the patient's 70/30 insulin was decreased to 35 units every morning on 01/19/2016. Prior to that, the patient was taking 70/30--45 units in the morning. The patient was noted to have serum glucose of 750 one emergency department with anion gap of 26. She was started on intravenous fluids and intravenous insulin. She denies any recent fevers, chills, shortness breath, chest pain, dysuria, hematuria, headache, neck pain, rashes. Assessment/Plan: DKA -Initial anion gap 26 with serum glucose of 751 -Anion gap has closed -Transitioned to subcutaneous insulin -transition from Lantus back to 70/30 25 units bid -Continue intravenous fluids for now-->saline lock -Question patient compliance -11/23/2015 hemoglobin A1c 12.3 -Recheck A1c - Patient follows with Dr. 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 First diagnosed 48 and has been on insulin's since. - As per review of his records, she was on insulin pump treatment until CVAs prevented her from using the device in early 2014, in early 2015 she was changed to a simple insulin schedule, due to poor results with multiple daily injections. In 2015, she change from Levemir up NPH due to the pattern  of her CBG's. She has been on 70/30 45 units in AM's prior to admission according to Dr. Fransico Setters pt states she it bid Chronic systolic and diastolic CHF/ischemic cardiomyopathy/ -08/01/2015 echo--EF 45-50, grade 2 DD,Hypokinesis of the mid-apicalanteroseptal and anterior myocardium -Daily weights Acute kidney injury -Secondary to volume depletion -Based on creatinine 0.6-0.8 -resolved Mechanical AVR -INR 1.12 -Lovenox bridge -PharmD to assist with coumadin Coronary artery disease s/p CABG -No anginal symptoms presently -Continue aspirin -Continue statin Hyperlipidemia  -Continue statin  Rheumatoid Arthritis - does not take Rheumatrex at home Prior CVA - Continue Coumadin anticoagulation and low-dose aspirin. Anemia of chronic disease -B12 496, folate 14.0, iron sat 15%  Family Communication: No family at beside Disposition Plan: Home when INR trending up >2, hopefully 01/24/16    Procedures/Studies:  No results found.      Subjective: Patient denies fevers, chills, headache, chest pain, dyspnea, nausea, vomiting, diarrhea, abdominal pain, dysuria, hematuria   Objective: Filed Vitals:   01/22/16 2053 01/23/16 0135 01/23/16 0551 01/23/16 1433  BP: 109/44 99/42 132/57 111/51  Pulse: 80 66 62 70  Temp: 98.1 F (36.7 C) 97.7 F (36.5 C) 97.6 F (36.4 C) 98.2 F (36.8 C)  TempSrc: Axillary Oral Oral Axillary  Resp: 18 16 18 18   Height:      Weight:      SpO2: 99% 100% 100% 100%    Intake/Output Summary (Last 24 hours) at 01/23/16 1824 Last data filed at 01/23/16 1300  Gross per 24 hour  Intake    480 ml  Output      0 ml  Net    480 ml   Weight change:  Exam:   General:  Pt is alert, follows commands appropriately, not in acute distress  HEENT: No icterus, No thrush, No neck mass, [place]/AT  Cardiovascular: RRR, S1/S2, no rubs, no gallops  Respiratory: CTA bilaterally, no wheezing, no crackles, no rhonchi  Abdomen: Soft/+BS, non tender,  non distended, no guarding  Extremities: No edema, No lymphangitis, No petechiae, No rashes, no synovitis  Data Reviewed: Basic Metabolic Panel:  Recent Labs Lab 01/21/16 2257 01/22/16 0211 01/22/16 0715 01/23/16 0612  NA 129* 135 137 136  K 5.6* 4.1 3.9 3.7  CL 94* 105 112* 111  CO2 9* 11* 17* 21*  GLUCOSE 751* 348* 109* 211*  BUN 17 17 12  <5*  CREATININE 1.39* 1.31* 0.81 0.71  CALCIUM 9.3 8.4* 7.9* 8.1*   Liver Function Tests:  Recent Labs Lab 01/21/16 2257  AST 17  ALT 16  ALKPHOS 91  BILITOT 1.7*  PROT 6.9  ALBUMIN 3.6    Recent Labs Lab 01/21/16 2257  LIPASE 15   No results for input(s): AMMONIA in the last 168 hours. CBC:  Recent Labs Lab 01/21/16 2257 01/23/16 0612  WBC 9.8 6.6  NEUTROABS 7.4  --   HGB 9.9* 9.1*  HCT 31.2* 28.9*  MCV 79.8 79.8  PLT 308 235   Cardiac Enzymes: No results for input(s): CKTOTAL, CKMB, CKMBINDEX, TROPONINI in the last 168 hours. BNP: Invalid input(s): POCBNP CBG:  Recent Labs Lab 01/22/16 1609 01/22/16 2057 01/23/16 0733 01/23/16 1131 01/23/16 1654  GLUCAP 139* 178* 175* 254* 284*    Recent Results (from the past 240 hour(s))  MRSA PCR Screening     Status: None   Collection Time: 01/22/16  1:26 AM  Result Value Ref Range Status   MRSA by PCR NEGATIVE NEGATIVE Final    Comment:        The GeneXpert MRSA Assay (FDA approved for NASAL specimens only), is one component of a comprehensive MRSA colonization surveillance program. It is not intended to diagnose MRSA infection nor to guide or monitor treatment for MRSA infections.      Scheduled Meds: . aspirin EC  81 mg Oral Daily  . darifenacin  7.5 mg Oral Daily  . enoxaparin (LOVENOX) injection  65 mg Subcutaneous Q12H  . escitalopram  20 mg Oral Daily  . [START ON 01/24/2016] insulin aspart  0-15 Units Subcutaneous TID WC  . insulin aspart  0-5 Units Subcutaneous QHS  . [START ON 01/24/2016] insulin aspart protamine- aspart  25 Units  Subcutaneous BID WC  . pantoprazole  40 mg Oral Daily  . pravastatin  20 mg Oral Daily  . warfarin  7.5 mg Oral q1800  . warfarin  7.5 mg Oral ONCE-1800  . Warfarin - Pharmacist Dosing Inpatient   Does not apply q1800   Continuous Infusions: . sodium chloride 100 mL/hr at 01/23/16 0801     Iana Buzan, DO  [facility] Pager [phone]  If 7PM-7AM, please contact night-coverage [email or web address] Password [email or web address] 01/23/2016, 6:24 PM   LOS: 1 day

## 2016-01-23 NOTE — Consult Note (Signed)
   [facility] CM Inpatient Consult   01/23/2016  Madison [name] 11/01/1961 [id number]   Patient is active with [facility] Care Management program. Will continue to follow and make inpatient RNCM aware patient is active with [facility] Care Management program.  Raiford Noble, MSN-Ed, RN,BSN [facility] Liaison [phone]

## 2016-01-23 NOTE — Care Management Note (Signed)
Case Management Note  Patient Details  Name: Madison [name] MRN: [id number] Date of Birth: 1960/11/25  Subjective/Objective:    DM                Action/Plan:Date:  January 23, 2016 Chart reviewed for concurrent status and case management needs. Will continue to follow patient for changes and needs: Marcelle Smiling, BSN, RN, [place]   [phone]   Expected Discharge Date:  01/24/16               Expected Discharge Plan:  Home/Self Care  In-House Referral:  NA  Discharge planning Services  CM Consult  Post Acute Care Choice:  NA Choice offered to:  NA  DME Arranged:    DME Agency:     HH Arranged:    HH Agency:     Status of Service:  In process, will continue to follow  Medicare Important Message Given:    Date Medicare IM Given:    Medicare IM give by:    Date Additional Medicare IM Given:    Additional Medicare Important Message give by:     If discussed at Long Length of Stay Meetings, dates discussed:    Additional Comments:  Golda Acre, RN 01/23/2016, 3:06 PM

## 2016-01-23 NOTE — Progress Notes (Signed)
ANTICOAGULATION CONSULT NOTE - Follow Up Consult  Pharmacy Consult for lovenox and warfarin Indication: mechanical AVR  Allergies  Allergen Reactions  . Other Other (See Comments)    Can burn skin if left on too long.TAPE  . Celebrex [Celecoxib] Rash  . Detrol [Tolterodine] Hives    Patient Measurements: Height: 5\' 3"  (160 cm) Weight: 145 lb 11.6 oz (66.1 kg) IBW/kg (Calculated) : 52.4   Vital Signs: Temp: 97.6 F (36.4 C) (03/06 0551) Temp Source: Oral (03/06 0551) BP: 132/57 mmHg (03/06 0551) Pulse Rate: 62 (03/06 0551)  Labs:  Recent Labs  01/21/16 2257 01/22/16 0211 01/22/16 0215 01/22/16 0715 01/23/16 0612  HGB 9.9*  --   --   --  9.1*  HCT 31.2*  --   --   --  28.9*  PLT 308  --   --   --  235  LABPROT  --   --  14.6  --  19.0*  INR  --   --  1.12  --  1.59*  CREATININE 1.39* 1.31*  --  0.81 0.71    Estimated Creatinine Clearance: 72.6 mL/min (by C-G formula based on Cr of 0.71).   Medications:  Warfarin home regimen: 7.5 mg daily  Assessment: Patient's a 55 y.o F on warfarin PTA for mechanical AVR.  Warfarin resumed on admission and currently bridging with lovenox due to sub-therapeutic INR.  Today, 01/23/2016: - INR sub-therapeutic at 1.59 but trending up - CBC relatively table - no bleeding documented - scr 0.71 (crcl>30) - no significant drug-drug intxns - on carb modified diet  Goal of Therapy:  INR 2.5-3.5 (per outpatient [facility] clinic) Monitor platelets by anticoagulation protocol: Yes   Plan:  - warfarin 7.5 mg PO x1 today - change lovenox to 65 mg SQ q12h - cbc q72h, daily INR - monitor for s/s bleeding  Catelynn Sparger P 01/23/2016,11:18 AM

## 2016-01-24 DIAGNOSIS — E119 Type 2 diabetes mellitus without complications: Secondary | ICD-10-CM | POA: Diagnosis not present

## 2016-01-24 LAB — BASIC METABOLIC PANEL
ANION GAP: 6 (ref 5–15)
CO2: 23 mmol/L (ref 22–32)
CREATININE: 0.58 mg/dL (ref 0.44–1.00)
Calcium: 8 mg/dL — ABNORMAL LOW (ref 8.9–10.3)
Chloride: 111 mmol/L (ref 101–111)
GFR calc non Af Amer: >60 mL/min (ref >60)
GLUCOSE: 61 mg/dL — AB (ref 65–99)
Potassium: 3.5 mmol/L (ref 3.5–5.1)
Sodium: 140 mmol/L (ref 135–145)

## 2016-01-24 LAB — GLUCOSE, CAPILLARY
Glucose-Capillary: 138 mg/dL — ABNORMAL HIGH (ref 65–99)
Glucose-Capillary: 90 mg/dL (ref 65–99)

## 2016-01-24 LAB — PROTIME-INR
INR: 2.24 — ABNORMAL HIGH (ref 0.00–1.49)
Prothrombin Time: 24.6 seconds — ABNORMAL HIGH (ref 11.6–15.2)

## 2016-01-24 MED ORDER — WARFARIN SODIUM 5 MG PO TABS
5.0000 mg | ORAL_TABLET | Freq: Once | ORAL | Status: AC
  Administered 2016-01-24: 5 mg via ORAL
  Filled 2016-01-24: qty 1

## 2016-01-24 MED ORDER — INSULIN ASPART PROT & ASPART (70-30 MIX) 100 UNIT/ML PEN: 30.0000 [IU] | PEN_INJECTOR | Freq: Two times a day (BID) | SUBCUTANEOUS | Status: DC

## 2016-01-24 MED ORDER — INSULIN ASPART PROT & ASPART (70-30 MIX) 100 UNIT/ML PEN: 25.0000 [IU] | PEN_INJECTOR | Freq: Two times a day (BID) | SUBCUTANEOUS | Status: DC

## 2016-01-24 NOTE — Consult Note (Signed)
   [facility] CM Inpatient Consult   01/24/2016  VERLISA VARA 1961-10-05 [id number]   Ms. Wien is active with [facility] Care Management. Went to bedside to speak with her about [facility] Care Management continuing to follow post hospital discharge. She is agreeable to this. She reports she has been taking her medications and prescribed and denies having issues affording them. She reports she will need transportation home today however if she is discharged. She also endorses she is active with [facility] home health for RN services. Made inpatient RNCM aware of conversation details with patient. Also made inpatient Licensed CSW aware about transportation needs for home if discharged home today.   Raiford Noble, MSN-Ed, RN,BSN [facility] Liaison [phone]

## 2016-01-24 NOTE — Care Management Note (Signed)
Case Management Note  Patient Details  Name: Madison [name] MRN: [id number] Date of Birth: 1961-09-29  Subjective/Objective:                  DKA Action/Plan: Discharge  planning Expected Discharge Date:  01/24/16               Expected Discharge Plan:  Home health  In-House Referral:  NA  Discharge planning Services  CM Consult  Post Acute Care Choice:  Resumption of Svcs/PTA Provider Choice offered to:     DME Arranged:    DME Agency:     HH Arranged:  RN HH Agency:  [facility]  Status of Service:  Completed, signed off  Medicare Important Message Given:    Date Medicare IM Given:    Medicare IM give by:    Date Additional Medicare IM Given:    Additional Medicare Important Message give by:     If discussed at Long Length of Stay Meetings, dates discussed:    Additional Comments: Pt is active with [facility] for HHRN(disease mgmt).  CM reqeusted HHRN resumption orders and called Tamala Bari of [facility] to notify of discharge.  No other CM needs were communicated. Yves Dill, RN 01/24/2016, 3:51 PM

## 2016-01-24 NOTE — Progress Notes (Signed)
Pt does not have transportation home.   CSW to assist Pt with transportation to Pt's home.  Pt has HHPT and can not travel by bus. Pt will require taxi cab transportation.   CSW will follow for dc planning.   Leron Croak [facility]  [phone]

## 2016-01-24 NOTE — Progress Notes (Signed)
ANTICOAGULATION CONSULT NOTE - Follow Up Consult  Pharmacy Consult for lovenox and warfarin Indication: mechanical AVR  Allergies  Allergen Reactions  . Other Other (See Comments)    Can burn skin if left on too long.TAPE  . Celebrex [Celecoxib] Rash  . Detrol [Tolterodine] Hives    Patient Measurements: Height: 5\' 3"  (160 cm) Weight: 145 lb 11.6 oz (66.1 kg) IBW/kg (Calculated) : 52.4   Vital Signs: Temp: 98 F (36.7 C) (03/07 0527) Temp Source: Oral (03/07 0527) BP: 145/54 mmHg (03/07 0527) Pulse Rate: 57 (03/07 0527)  Labs:  Recent Labs  01/21/16 2257  01/22/16 0215 01/22/16 0715 01/23/16 0612 01/24/16 0536  HGB 9.9*  --   --   --  9.1*  --   HCT 31.2*  --   --   --  28.9*  --   PLT 308  --   --   --  235  --   LABPROT  --   --  14.6  --  19.0* 24.6*  INR  --   --  1.12  --  1.59* 2.24*  CREATININE 1.39*  < >  --  0.81 0.71 0.58  < > = values in this interval not displayed.  Estimated Creatinine Clearance: 72.6 mL/min (by C-G formula based on Cr of 0.58).   Medications:  Warfarin home regimen: 7.5 mg daily  Assessment: Patient's a 55 y.o F on warfarin PTA for mechanical AVR.  Warfarin resumed on admission and currently bridging with lovenox due to sub-therapeutic INR.  Today, 01/24/2016: - INR now therapeutic but increased noticeably from 1.59 to 2.24 - CBC relatively table - no bleeding documented - scr 0.58 (crcl>30) - no significant drug-drug intxns - on carb modified diet  Goal of Therapy:  INR 2.5-3.5 (per outpatient [facility] clinic) Monitor platelets by anticoagulation protocol: Yes   Plan:  - Reduce warfarin 5 mg PO x1 today - continue lovenox to 65 mg SQ q12h until INR >2.5 - cbc q72h, daily INR - monitor for s/s bleeding  Jeramie Scogin P 01/24/2016,9:39 AM

## 2016-01-24 NOTE — Discharge Summary (Signed)
Physician Discharge Summary  Madison [name] GXQ:[id number] DOB: [birth year] DOA: 01/21/2016  PCP: [name], NP  Admit date: 01/21/2016 Discharge date: 01/24/2016  Recommendations for Outpatient Follow-up:  1. Pt will need to follow up with PCP in 2 weeks post discharge 2. Please obtain INR on 01/26/16 and adjust coumadin accordingly for INR 2.5-3.5   Discharge Diagnoses:  DKA -Initial anion gap 26 with serum glucose of 751 -Anion gap has closed -Transitioned to subcutaneous insulin -transition from Lantus back to 70/30 25 units bid -Continue intravenous fluids for now-->saline lock -Question patient compliance -11/23/2015 hemoglobin A1c 12.3 -Recheck A1c - Patient follows with Dr. 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 First diagnosed 80 and has been on insulin's since. - As per review of his records, she was on insulin pump treatment until CVAs prevented her from using the device in early 2014, in early 2015 she was changed to a simple insulin schedule, due to poor results with multiple daily injections. In 2015, she change from Levemir up NPH due to the pattern of her CBG's.  -She has been on 70/30 45 units in AM's prior to admission according to Dr. Fransico Setters pt states she is taking it bid -home with 70/30--25 units bid as pt was having hypoglycemia with 45 units BID Chronic systolic and diastolic CHF/ischemic cardiomyopathy/ -08/01/2015 echo--EF 45-50, grade 2 DD,Hypokinesis of the mid-apicalanteroseptal and anterior myocardium -Daily weights Acute kidney injury -Secondary to volume depletion -Based on creatinine 0.6-0.8 -resolved Mechanical AVR -INR 1.12 on day of admission -INR 2.24 on day of d/c -Lovenox bridge through last day of admission -PharmD to assist with coumadin -scheduled pt for INR check at her PCP on 01/26/16 at 10AM -pt did want to stay in hospital past 3/7 as she had important appt  at her son's school on 01/25/16 am Coronary artery disease s/p CABG -No anginal symptoms presently -Continue aspirin -Continue statin Hyperlipidemia  -Continue statin  Rheumatoid Arthritis - does not take Rheumatrex at home Prior CVA - Continue Coumadin anticoagulation and low-dose aspirin. Anemia of chronic disease -B12 496, folate 14.0, iron sat 15%  Discharge Condition: stable  Disposition: home Follow-up Information    Follow up with [facility] [place].   Specialty:  Family Medicine   Why:  01/26/16--10AM   Contact information:   [address] [place] [address] [phone]      Follow up with [facility].   Specialty:  Home Health Services   Why:  Home health nurse   Contact information:   [address] [place] [address] [phone]       Diet:heart healthy Wt Readings from Last 3 Encounters:  01/22/16 66.1 kg (145 lb 11.6 oz)  12/26/15 72.576 kg (160 lb)  12/15/15 69.31 kg (152 lb 12.8 oz)    History of present illness:  55 year old female with a history of diabetes mellitus type 2, CAD, h/o strokes (residual slurred/slowed speech), h/o AVR on warfarin, coronary artery disease status post CABG, stroke with left Middle purses, systolic and diastolic CHF, and hypertension presents with 2 day history of nausea and vomiting. She states her sugars had been low earlier this week (in the 50s) but then today it was 502 when she checked it prior to EMS arrival. Interestingly, she endorses compliance with her insulin. So also endorses polydipsia and polyuria urea. According to telephone notes, the patient's 70/30 insulin was decreased to 35 units  every morning on 01/19/2016. Prior to that, the patient was taking 70/30--45 units in the morning. The patient was noted to have serum glucose of 750 one emergency department with anion gap of 26. She was started on intravenous fluids and intravenous  insulin. She denies any recent fevers, chills, shortness breath, chest pain, dysuria, hematuria, headache, neck pain, rashes.    Discharge Exam: Filed Vitals:   01/24/16 0200 01/24/16 0527  BP: 107/42 145/54  Pulse: 67 57  Temp: 98.1 F (36.7 C) 98 F (36.7 C)  Resp: 16 18   Filed Vitals:   01/23/16 1433 01/23/16 2119 01/24/16 0200 01/24/16 0527  BP: 111/51 127/50 107/42 145/54  Pulse: 70 79 67 57  Temp: 98.2 F (36.8 C) 98.9 F (37.2 C) 98.1 F (36.7 C) 98 F (36.7 C)  TempSrc: Axillary Oral Oral Oral  Resp: 18 18 16 18   Height:      Weight:      SpO2: 100% 98% 98% 99%   General: A&O x 3, NAD, pleasant, cooperative Cardiovascular: RRR, no rub, no gallop, no S3 Respiratory: CTAB, no wheeze, no rhonchi Abdomen:soft, nontender, nondistended, positive bowel sounds Extremities: No edema, No lymphangitis, no petechiae  Discharge Instructions  Discharge Instructions    Diet - low sodium heart healthy    Complete by:  As directed      Increase activity slowly    Complete by:  As directed             Medication List    TAKE these medications        acetaminophen 325 MG tablet  Commonly known as:  TYLENOL  Take 2 tablets (650 mg total) by mouth every 6 (six) hours as needed for mild pain (temp > 101.5).     aspirin 81 MG tablet  Take 1 tablet (81 mg total) by mouth daily.     escitalopram 20 MG tablet  Commonly known as:  LEXAPRO  Take 1 tablet (20 mg total) by mouth daily.     insulin aspart protamine - aspart (70-30) 100 UNIT/ML FlexPen  Commonly known as:  NOVOLOG MIX 70/30 FLEXPEN  Inject 0.25 mLs (25 Units total) into the skin 2 (two) times daily. And pen needles 1/day     lisinopril 2.5 MG tablet  Commonly known as:  PRINIVIL,ZESTRIL     omeprazole 40 MG capsule  Commonly known as:  PRILOSEC  TAKE 1 CAPSULE (40 MG TOTAL) BY MOUTH DAILY.     ondansetron 4 MG tablet  Commonly known as:  ZOFRAN  Take 1 tablet (4 mg total) by mouth every 8 (eight)  hours as needed for nausea or vomiting.     pravastatin 20 MG tablet  Commonly known as:  PRAVACHOL  Take 1 tablet (20 mg total) by mouth daily.     solifenacin 5 MG tablet  Commonly known as:  VESICARE  Take 1 tablet (5 mg total) by mouth daily.     warfarin 5 MG tablet  Commonly known as:  COUMADIN  Take 7.5 mg by mouth daily.         The results of significant diagnostics from this hospitalization (including imaging, microbiology, ancillary and laboratory) are listed below for reference.    Significant Diagnostic Studies: No results found.   Microbiology: Recent Results (from the past 240 hour(s))  MRSA PCR Screening     Status: None   Collection Time: 01/22/16  1:26 AM  Result Value Ref Range Status   MRSA by PCR  NEGATIVE NEGATIVE Final    Comment:        The GeneXpert MRSA Assay (FDA approved for NASAL specimens only), is one component of a comprehensive MRSA colonization surveillance program. It is not intended to diagnose MRSA infection nor to guide or monitor treatment for MRSA infections.      Labs: Basic Metabolic Panel:  Recent Labs Lab 01/21/16 2257 01/22/16 0211 01/22/16 0715 01/23/16 0612 01/24/16 0536  NA 129* 135 137 136 140  K 5.6* 4.1 3.9 3.7 3.5  CL 94* 105 112* 111 111  CO2 9* 11* 17* 21* 23  GLUCOSE 751* 348* 109* 211* 61*  BUN 17 17 12  <5* <5*  CREATININE 1.39* 1.31* 0.81 0.71 0.58  CALCIUM 9.3 8.4* 7.9* 8.1* 8.0*   Liver Function Tests:  Recent Labs Lab 01/21/16 2257  AST 17  ALT 16  ALKPHOS 91  BILITOT 1.7*  PROT 6.9  ALBUMIN 3.6    Recent Labs Lab 01/21/16 2257  LIPASE 15   No results for input(s): AMMONIA in the last 168 hours. CBC:  Recent Labs Lab 01/21/16 2257 01/23/16 0612  WBC 9.8 6.6  NEUTROABS 7.4  --   HGB 9.9* 9.1*  HCT 31.2* 28.9*  MCV 79.8 79.8  PLT 308 235   Cardiac Enzymes: No results for input(s): CKTOTAL, CKMB, CKMBINDEX, TROPONINI in the last 168 hours. BNP: Invalid input(s):  POCBNP CBG:  Recent Labs Lab 01/23/16 1131 01/23/16 1654 01/23/16 2122 01/24/16 0729 01/24/16 1203  GLUCAP 254* 284* 230* 138* 90    Time coordinating discharge:  Greater than 30 minutes  Signed:  Ranbir Chew, DO [facility] Pager: 03/25/16 01/24/2016, 10:12 PM

## 2016-01-24 NOTE — Progress Notes (Signed)
Discharge instructions given to pt, verbalized understanding. Left the unit in stable condition. 

## 2016-01-25 ENCOUNTER — Telehealth: Payer: Self-pay

## 2016-01-25 DIAGNOSIS — E119 Type 2 diabetes mellitus without complications: Secondary | ICD-10-CM | POA: Diagnosis not present

## 2016-01-25 NOTE — Telephone Encounter (Signed)
First attempt for TCM  

## 2016-01-26 ENCOUNTER — Other Ambulatory Visit: Payer: Self-pay | Admitting: General Practice

## 2016-01-26 ENCOUNTER — Telehealth: Payer: Self-pay | Admitting: *Deleted

## 2016-01-26 ENCOUNTER — Ambulatory Visit (INDEPENDENT_AMBULATORY_CARE_PROVIDER_SITE_OTHER): Payer: Commercial Managed Care - HMO | Admitting: General Practice

## 2016-01-26 ENCOUNTER — Other Ambulatory Visit: Payer: Self-pay

## 2016-01-26 DIAGNOSIS — I110 Hypertensive heart disease with heart failure: Secondary | ICD-10-CM | POA: Diagnosis not present

## 2016-01-26 DIAGNOSIS — I5042 Chronic combined systolic (congestive) and diastolic (congestive) heart failure: Secondary | ICD-10-CM | POA: Diagnosis not present

## 2016-01-26 DIAGNOSIS — R1314 Dysphagia, pharyngoesophageal phase: Secondary | ICD-10-CM | POA: Diagnosis not present

## 2016-01-26 DIAGNOSIS — Z952 Presence of prosthetic heart valve: Secondary | ICD-10-CM

## 2016-01-26 DIAGNOSIS — D649 Anemia, unspecified: Secondary | ICD-10-CM | POA: Diagnosis not present

## 2016-01-26 DIAGNOSIS — E119 Type 2 diabetes mellitus without complications: Secondary | ICD-10-CM | POA: Diagnosis not present

## 2016-01-26 DIAGNOSIS — M0600 Rheumatoid arthritis without rheumatoid factor, unspecified site: Secondary | ICD-10-CM | POA: Diagnosis not present

## 2016-01-26 DIAGNOSIS — E785 Hyperlipidemia, unspecified: Secondary | ICD-10-CM | POA: Diagnosis not present

## 2016-01-26 DIAGNOSIS — Z7901 Long term (current) use of anticoagulants: Secondary | ICD-10-CM | POA: Diagnosis not present

## 2016-01-26 DIAGNOSIS — Z954 Presence of other heart-valve replacement: Secondary | ICD-10-CM

## 2016-01-26 DIAGNOSIS — I2510 Atherosclerotic heart disease of native coronary artery without angina pectoris: Secondary | ICD-10-CM | POA: Diagnosis not present

## 2016-01-26 DIAGNOSIS — I350 Nonrheumatic aortic (valve) stenosis: Secondary | ICD-10-CM | POA: Diagnosis not present

## 2016-01-26 DIAGNOSIS — E109 Type 1 diabetes mellitus without complications: Secondary | ICD-10-CM | POA: Diagnosis not present

## 2016-01-26 LAB — POCT INR: INR: 1.5

## 2016-01-26 MED ORDER — WARFARIN SODIUM 7.5 MG PO TABS: ORAL_TABLET | ORAL | Status: DC

## 2016-01-26 NOTE — Telephone Encounter (Signed)
Transition Care Management Follow-up Telephone Call  How have you been since you were released from the hospital? good   Do you understand why you were in the hospital? yes   Do you understand the discharge instrcutions? yes  Items Reviewed:  Medications reviewed: yes  Allergies reviewed: yes  Dietary changes reviewed: yes  Referrals reviewed: yes   Functional Questionnaire:   Activities of Daily Living (ADLs):   She states they are independent in the following: ambulation, bathing and hygiene, feeding, continence, grooming, toileting and dressing States they require assistance with the following: none   Any transportation issues/concerns?: yes   Any patient concerns? yes   Confirmed importance and date/time of follow-up visits scheduled: yes   Confirmed with patient if condition begins to worsen call PCP or go to the ER.  Patient was given the Call-a-Nurse line [phone]: yes Patient was discharged 01/24/16 Patient was discharged to her home Patient has an appointment with Denyse Amass 02/06/16

## 2016-01-26 NOTE — Progress Notes (Signed)
I agree with this plan.

## 2016-01-26 NOTE — Progress Notes (Signed)
Pre visit review using our clinic review tool, if applicable. No additional management support is needed unless otherwise documented below in the visit note. 

## 2016-01-26 NOTE — Patient Outreach (Signed)
Unsuccessful attempt times 3 to contact patient via telephone. Unable to leave HIPPA compliant message as outgoing message indicated voice recording has not bee set up.  Plan: Make another attempt to contact patient via telephone on Monday, March 13 to schedule home visit.

## 2016-01-27 ENCOUNTER — Inpatient Hospital Stay (HOSPITAL_COMMUNITY)
Admission: EM | Admit: 2016-01-27 | Discharge: 2016-01-30 | DRG: 637 | Disposition: A | Discharge status: Home / Self Care | Payer: Commercial Managed Care - HMO | Attending: Internal Medicine | Admitting: Internal Medicine

## 2016-01-27 ENCOUNTER — Emergency Department (HOSPITAL_COMMUNITY): Payer: Commercial Managed Care - HMO

## 2016-01-27 ENCOUNTER — Encounter (HOSPITAL_COMMUNITY): Payer: Self-pay | Admitting: Emergency Medicine

## 2016-01-27 ENCOUNTER — Other Ambulatory Visit: Payer: Self-pay | Admitting: *Deleted

## 2016-01-27 DIAGNOSIS — E876 Hypokalemia: Secondary | ICD-10-CM | POA: Diagnosis present

## 2016-01-27 DIAGNOSIS — Z794 Long term (current) use of insulin: Secondary | ICD-10-CM

## 2016-01-27 DIAGNOSIS — E1010 Type 1 diabetes mellitus with ketoacidosis without coma: Secondary | ICD-10-CM | POA: Diagnosis not present

## 2016-01-27 DIAGNOSIS — E785 Hyperlipidemia, unspecified: Secondary | ICD-10-CM | POA: Diagnosis not present

## 2016-01-27 DIAGNOSIS — Z7901 Long term (current) use of anticoagulants: Secondary | ICD-10-CM | POA: Diagnosis not present

## 2016-01-27 DIAGNOSIS — I5032 Chronic diastolic (congestive) heart failure: Secondary | ICD-10-CM | POA: Insufficient documentation

## 2016-01-27 DIAGNOSIS — I509 Heart failure, unspecified: Secondary | ICD-10-CM | POA: Diagnosis not present

## 2016-01-27 DIAGNOSIS — Z954 Presence of other heart-valve replacement: Secondary | ICD-10-CM | POA: Diagnosis not present

## 2016-01-27 DIAGNOSIS — I639 Cerebral infarction, unspecified: Secondary | ICD-10-CM | POA: Diagnosis present

## 2016-01-27 DIAGNOSIS — E1110 Type 2 diabetes mellitus with ketoacidosis without coma: Secondary | ICD-10-CM | POA: Diagnosis present

## 2016-01-27 DIAGNOSIS — I2510 Atherosclerotic heart disease of native coronary artery without angina pectoris: Secondary | ICD-10-CM | POA: Diagnosis present

## 2016-01-27 DIAGNOSIS — E119 Type 2 diabetes mellitus without complications: Secondary | ICD-10-CM | POA: Diagnosis not present

## 2016-01-27 DIAGNOSIS — E1042 Type 1 diabetes mellitus with diabetic polyneuropathy: Secondary | ICD-10-CM | POA: Diagnosis present

## 2016-01-27 DIAGNOSIS — I10 Essential (primary) hypertension: Secondary | ICD-10-CM | POA: Diagnosis present

## 2016-01-27 DIAGNOSIS — R4182 Altered mental status, unspecified: Secondary | ICD-10-CM | POA: Diagnosis not present

## 2016-01-27 DIAGNOSIS — I5022 Chronic systolic (congestive) heart failure: Secondary | ICD-10-CM | POA: Diagnosis not present

## 2016-01-27 DIAGNOSIS — F329 Major depressive disorder, single episode, unspecified: Secondary | ICD-10-CM | POA: Diagnosis present

## 2016-01-27 DIAGNOSIS — R402411 Glasgow coma scale score 13-15, in the field [EMT or ambulance]: Secondary | ICD-10-CM | POA: Diagnosis not present

## 2016-01-27 DIAGNOSIS — E1169 Type 2 diabetes mellitus with other specified complication: Secondary | ICD-10-CM | POA: Diagnosis not present

## 2016-01-27 DIAGNOSIS — K219 Gastro-esophageal reflux disease without esophagitis: Secondary | ICD-10-CM | POA: Diagnosis not present

## 2016-01-27 DIAGNOSIS — Z8673 Personal history of transient ischemic attack (TIA), and cerebral infarction without residual deficits: Secondary | ICD-10-CM | POA: Diagnosis not present

## 2016-01-27 DIAGNOSIS — Z952 Presence of prosthetic heart valve: Secondary | ICD-10-CM | POA: Diagnosis not present

## 2016-01-27 DIAGNOSIS — I5042 Chronic combined systolic (congestive) and diastolic (congestive) heart failure: Secondary | ICD-10-CM | POA: Diagnosis not present

## 2016-01-27 DIAGNOSIS — E162 Hypoglycemia, unspecified: Secondary | ICD-10-CM | POA: Insufficient documentation

## 2016-01-27 LAB — BLOOD GAS, ARTERIAL
Acid-base deficit: 24.7 mmol/L — ABNORMAL HIGH (ref 0.0–2.0)
Bicarbonate: 3.6 mEq/L — ABNORMAL LOW (ref 20.0–24.0)
DRAWN BY: 235321
FIO2: 0.21
O2 SAT: 93.3 %
PATIENT TEMPERATURE: 98.6
TCO2: 3.6 mmol/L (ref 0–100)
pCO2 arterial: 11.3 mmHg — CL (ref 35.0–45.0)
pH, Arterial: 7.129 — CL (ref 7.350–7.450)
pO2, Arterial: 87.1 mmHg (ref 80.0–100.0)

## 2016-01-27 LAB — BASIC METABOLIC PANEL
ANION GAP: 20 — AB (ref 5–15)
ANION GAP: 7 (ref 5–15)
ANION GAP: 9 (ref 5–15)
BUN: 21 mg/dL — ABNORMAL HIGH (ref 6–20)
BUN: 18 mg/dL (ref 6–20)
BUN: 16 mg/dL (ref 6–20)
CALCIUM: 8.3 mg/dL — AB (ref 8.9–10.3)
CALCIUM: 7.8 mg/dL — AB (ref 8.9–10.3)
CO2: 10 mmol/L — AB (ref 22–32)
CO2: 19 mmol/L — AB (ref 22–32)
CO2: 17 mmol/L — AB (ref 22–32)
CREATININE: 1.4 mg/dL — AB (ref 0.44–1.00)
CREATININE: 0.93 mg/dL (ref 0.44–1.00)
Calcium: 7.8 mg/dL — ABNORMAL LOW (ref 8.9–10.3)
Chloride: 111 mmol/L (ref 101–111)
Chloride: 113 mmol/L — ABNORMAL HIGH (ref 101–111)
Chloride: 114 mmol/L — ABNORMAL HIGH (ref 101–111)
Creatinine, Ser: 0.98 mg/dL (ref 0.44–1.00)
GFR calc Af Amer: 48 mL/min — ABNORMAL LOW (ref >60)
GFR calc Af Amer: >60 mL/min (ref >60)
GFR calc Af Amer: >60 mL/min (ref >60)
GFR calc non Af Amer: >60 mL/min (ref >60)
GFR, EST NON AFRICAN AMERICAN: 41 mL/min — AB (ref >60)
GLUCOSE: 501 mg/dL — AB (ref 65–99)
GLUCOSE: 142 mg/dL — AB (ref 65–99)
GLUCOSE: 122 mg/dL — AB (ref 65–99)
Potassium: 4.3 mmol/L (ref 3.5–5.1)
Potassium: 3.5 mmol/L (ref 3.5–5.1)
Potassium: 3.6 mmol/L (ref 3.5–5.1)
Sodium: 141 mmol/L (ref 135–145)
Sodium: 139 mmol/L (ref 135–145)
Sodium: 140 mmol/L (ref 135–145)

## 2016-01-27 LAB — CBC WITH DIFFERENTIAL/PLATELET
Basophils Absolute: 0 10*3/uL (ref 0.0–0.1)
Basophils Relative: 0 %
Eosinophils Absolute: 0 10*3/uL (ref 0.0–0.7)
Eosinophils Relative: 0 %
HCT: 27.2 % — ABNORMAL LOW (ref 36.0–46.0)
Hemoglobin: 8.1 g/dL — ABNORMAL LOW (ref 12.0–15.0)
Lymphocytes Relative: 5 %
Lymphs Abs: 0.7 10*3/uL (ref 0.7–4.0)
MCH: 25.2 pg — ABNORMAL LOW (ref 26.0–34.0)
MCHC: 29.8 g/dL — ABNORMAL LOW (ref 30.0–36.0)
MCV: 84.5 fL (ref 78.0–100.0)
MONO ABS: 0.4 10*3/uL (ref 0.1–1.0)
MONOS PCT: 3 %
NEUTROS PCT: 92 %
Neutro Abs: 12.9 10*3/uL — ABNORMAL HIGH (ref 1.7–7.7)
PLATELETS: 211 10*3/uL (ref 150–400)
RBC: 3.22 MIL/uL — AB (ref 3.87–5.11)
RDW: 18.4 % — ABNORMAL HIGH (ref 11.5–15.5)
WBC: 14 10*3/uL — AB (ref 4.0–10.5)

## 2016-01-27 LAB — CBC
HCT: 26.9 % — ABNORMAL LOW (ref 36.0–46.0)
Hemoglobin: 8.1 g/dL — ABNORMAL LOW (ref 12.0–15.0)
MCH: 25 pg — ABNORMAL LOW (ref 26.0–34.0)
MCHC: 30.1 g/dL (ref 30.0–36.0)
MCV: 83 fL (ref 78.0–100.0)
PLATELETS: 259 10*3/uL (ref 150–400)
RBC: 3.24 MIL/uL — ABNORMAL LOW (ref 3.87–5.11)
RDW: 18 % — AB (ref 11.5–15.5)
WBC: 19.6 10*3/uL — AB (ref 4.0–10.5)

## 2016-01-27 LAB — GLUCOSE, CAPILLARY
GLUCOSE-CAPILLARY: 100 mg/dL — AB (ref 65–99)
GLUCOSE-CAPILLARY: 144 mg/dL — AB (ref 65–99)
GLUCOSE-CAPILLARY: 226 mg/dL — AB (ref 65–99)
GLUCOSE-CAPILLARY: 303 mg/dL — AB (ref 65–99)
GLUCOSE-CAPILLARY: 364 mg/dL — AB (ref 65–99)
GLUCOSE-CAPILLARY: 84 mg/dL (ref 65–99)
Glucose-Capillary: 205 mg/dL — ABNORMAL HIGH (ref 65–99)
Glucose-Capillary: 147 mg/dL — ABNORMAL HIGH (ref 65–99)
Glucose-Capillary: 102 mg/dL — ABNORMAL HIGH (ref 65–99)
Glucose-Capillary: 55 mg/dL — ABNORMAL LOW (ref 65–99)

## 2016-01-27 LAB — COMPREHENSIVE METABOLIC PANEL
ALBUMIN: 3.6 g/dL (ref 3.5–5.0)
ALK PHOS: 98 U/L (ref 38–126)
ALT: 28 U/L (ref 14–54)
ANION GAP: 28 — AB (ref 5–15)
AST: 32 U/L (ref 15–41)
BILIRUBIN TOTAL: 2 mg/dL — AB (ref 0.3–1.2)
BUN: 24 mg/dL — AB (ref 6–20)
CALCIUM: 8.4 mg/dL — AB (ref 8.9–10.3)
CO2: 7 mmol/L — ABNORMAL LOW (ref 22–32)
CREATININE: 1.54 mg/dL — AB (ref 0.44–1.00)
Chloride: 91 mmol/L — ABNORMAL LOW (ref 101–111)
GFR calc Af Amer: 43 mL/min — ABNORMAL LOW (ref >60)
GFR calc non Af Amer: 37 mL/min — ABNORMAL LOW (ref >60)
GLUCOSE: 914 mg/dL — AB (ref 65–99)
Potassium: 5.7 mmol/L — ABNORMAL HIGH (ref 3.5–5.1)
Sodium: 126 mmol/L — ABNORMAL LOW (ref 135–145)
TOTAL PROTEIN: 6.8 g/dL (ref 6.5–8.1)

## 2016-01-27 LAB — PROTIME-INR
INR: 1.53 — ABNORMAL HIGH (ref 0.00–1.49)
Prothrombin Time: 17.9 seconds — ABNORMAL HIGH (ref 11.6–15.2)

## 2016-01-27 LAB — I-STAT CHEM 8, ED
BUN: 22 mg/dL — AB (ref 6–20)
CALCIUM ION: 0.97 mmol/L — AB (ref 1.12–1.23)
Chloride: 105 mmol/L (ref 101–111)
Creatinine, Ser: 0.7 mg/dL (ref 0.44–1.00)
HEMATOCRIT: 28 % — AB (ref 36.0–46.0)
Hemoglobin: 9.5 g/dL — ABNORMAL LOW (ref 12.0–15.0)
Potassium: 4.5 mmol/L (ref 3.5–5.1)
SODIUM: 135 mmol/L (ref 135–145)
TCO2: 9 mmol/L (ref 0–100)

## 2016-01-27 LAB — RAPID URINE DRUG SCREEN, HOSP PERFORMED
Amphetamines: NOT DETECTED
Barbiturates: NOT DETECTED
Benzodiazepines: NOT DETECTED
Cocaine: NOT DETECTED
Opiates: NOT DETECTED
Tetrahydrocannabinol: NOT DETECTED

## 2016-01-27 LAB — URINALYSIS, ROUTINE W REFLEX MICROSCOPIC
BILIRUBIN URINE: NEGATIVE
Hgb urine dipstick: NEGATIVE
LEUKOCYTES UA: NEGATIVE
NITRITE: NEGATIVE
PROTEIN: NEGATIVE mg/dL
Specific Gravity, Urine: 1.023 (ref 1.005–1.030)
pH: 5 (ref 5.0–8.0)

## 2016-01-27 LAB — HEPARIN LEVEL (UNFRACTIONATED)
HEPARIN UNFRACTIONATED: 0.54 [IU]/mL (ref 0.30–0.70)
Heparin Unfractionated: 0.62 IU/mL (ref 0.30–0.70)

## 2016-01-27 LAB — URINE MICROSCOPIC-ADD ON: RBC / HPF: NONE SEEN RBC/hpf (ref 0–5)

## 2016-01-27 LAB — I-STAT TROPONIN, ED: Troponin i, poc: 0.02 ng/mL (ref 0.00–0.08)

## 2016-01-27 LAB — CBG MONITORING, ED
GLUCOSE-CAPILLARY: 552 mg/dL — AB (ref 65–99)
GLUCOSE-CAPILLARY: 467 mg/dL — AB (ref 65–99)
Glucose-Capillary: >600 mg/dL (ref 65–99)
Glucose-Capillary: 525 mg/dL — ABNORMAL HIGH (ref 65–99)
Glucose-Capillary: 405 mg/dL — ABNORMAL HIGH (ref 65–99)

## 2016-01-27 LAB — MRSA PCR SCREENING: MRSA by PCR: NEGATIVE

## 2016-01-27 LAB — APTT

## 2016-01-27 MED ORDER — HEPARIN SODIUM (PORCINE) 5000 UNIT/ML IJ SOLN: 5000.0000 [IU] | Freq: Three times a day (TID) | INTRAMUSCULAR | Status: DC

## 2016-01-27 MED ORDER — HEPARIN (PORCINE) IN NACL 100-0.45 UNIT/ML-% IJ SOLN
950.0000 [IU]/h | INTRAMUSCULAR | Status: DC
  Administered 2016-01-27 (×2): 950 [IU]/h via INTRAVENOUS
  Filled 2016-01-27 (×3): qty 250

## 2016-01-27 MED ORDER — SODIUM CHLORIDE 0.9 % IV SOLN
INTRAVENOUS | Status: DC
  Administered 2016-01-27: 06:00:00 via INTRAVENOUS

## 2016-01-27 MED ORDER — SODIUM CHLORIDE 0.9 % IV SOLN
INTRAVENOUS | Status: DC
  Administered 2016-01-27: 4.9 [IU]/h via INTRAVENOUS
  Administered 2016-01-27: 5.4 [IU]/h via INTRAVENOUS
  Filled 2016-01-27: qty 2.5

## 2016-01-27 MED ORDER — INSULIN ASPART 100 UNIT/ML ~~LOC~~ SOLN
0.0000 [IU] | Freq: Three times a day (TID) | SUBCUTANEOUS | Status: DC
Start: 2016-01-28 — End: 2016-01-30
  Administered 2016-01-30: 3 [IU] via SUBCUTANEOUS
  Administered 2016-01-30: 7 [IU] via SUBCUTANEOUS
  Administered 2016-01-30: 2 [IU] via SUBCUTANEOUS

## 2016-01-27 MED ORDER — DEXTROSE 50 % IV SOLN
INTRAVENOUS | Status: AC
  Administered 2016-01-27: 50 mL
  Filled 2016-01-27: qty 50

## 2016-01-27 MED ORDER — WARFARIN - PHARMACIST DOSING INPATIENT: Freq: Every day | Status: DC

## 2016-01-27 MED ORDER — DEXTROSE-NACL 5-0.45 % IV SOLN
INTRAVENOUS | Status: DC
  Administered 2016-01-27: 13:00:00 via INTRAVENOUS

## 2016-01-27 MED ORDER — ACETAMINOPHEN 325 MG PO TABS: 650.0000 mg | ORAL_TABLET | Freq: Four times a day (QID) | ORAL | Status: DC | PRN

## 2016-01-27 MED ORDER — DARIFENACIN HYDROBROMIDE ER 7.5 MG PO TB24
7.5000 mg | ORAL_TABLET | Freq: Every day | ORAL | Status: DC
  Administered 2016-01-27 – 2016-01-30 (×4): 7.5 mg via ORAL
  Filled 2016-01-27 (×4): qty 1

## 2016-01-27 MED ORDER — ASPIRIN EC 81 MG PO TBEC
81.0000 mg | DELAYED_RELEASE_TABLET | Freq: Every day | ORAL | Status: DC
  Administered 2016-01-27 – 2016-01-30 (×4): 81 mg via ORAL
  Filled 2016-01-27 (×5): qty 1

## 2016-01-27 MED ORDER — SODIUM CHLORIDE 0.9 % IV BOLUS (SEPSIS): 1000.0000 mL | Freq: Once | INTRAVENOUS | Status: DC

## 2016-01-27 MED ORDER — DEXTROSE-NACL 5-0.45 % IV SOLN: INTRAVENOUS | Status: DC

## 2016-01-27 MED ORDER — INSULIN DETEMIR 100 UNIT/ML ~~LOC~~ SOLN
10.0000 [IU] | Freq: Once | SUBCUTANEOUS | Status: AC
  Administered 2016-01-27: 10 [IU] via SUBCUTANEOUS
  Filled 2016-01-27: qty 0.1

## 2016-01-27 MED ORDER — SODIUM CHLORIDE 0.9 % IV BOLUS (SEPSIS)
500.0000 mL | Freq: Once | INTRAVENOUS | Status: AC
Start: 2016-01-27 — End: 2016-01-27
  Administered 2016-01-27: 500 mL via INTRAVENOUS

## 2016-01-27 MED ORDER — SODIUM CHLORIDE 0.9 % IV BOLUS (SEPSIS)
500.0000 mL | Freq: Once | INTRAVENOUS | Status: AC
  Administered 2016-01-27: 500 mL via INTRAVENOUS

## 2016-01-27 MED ORDER — WARFARIN SODIUM 5 MG PO TABS
10.0000 mg | ORAL_TABLET | Freq: Once | ORAL | Status: AC
  Administered 2016-01-27: 10 mg via ORAL
  Filled 2016-01-27: qty 2

## 2016-01-27 MED ORDER — ONDANSETRON HCL 4 MG PO TABS: 4.0000 mg | ORAL_TABLET | Freq: Three times a day (TID) | ORAL | Status: DC | PRN

## 2016-01-27 MED ORDER — INSULIN ASPART PROT & ASPART (70-30 MIX) 100 UNIT/ML ~~LOC~~ SUSP
25.0000 [IU] | Freq: Two times a day (BID) | SUBCUTANEOUS | Status: DC
  Administered 2016-01-27 – 2016-01-28 (×3): 25 [IU] via SUBCUTANEOUS
  Filled 2016-01-27: qty 10

## 2016-01-27 MED ORDER — HEPARIN BOLUS VIA INFUSION
1500.0000 [IU] | Freq: Once | INTRAVENOUS | Status: AC
  Administered 2016-01-27: 1500 [IU] via INTRAVENOUS
  Filled 2016-01-27: qty 1500

## 2016-01-27 MED ORDER — SODIUM CHLORIDE 0.9 % IV SOLN
INTRAVENOUS | Status: DC
  Administered 2016-01-28 – 2016-01-29 (×2): via INTRAVENOUS

## 2016-01-27 MED ORDER — SODIUM CHLORIDE 0.9 % IV BOLUS (SEPSIS)
1000.0000 mL | Freq: Once | INTRAVENOUS | Status: AC
  Administered 2016-01-27: 1000 mL via INTRAVENOUS

## 2016-01-27 MED ORDER — ESCITALOPRAM OXALATE 20 MG PO TABS
20.0000 mg | ORAL_TABLET | Freq: Every day | ORAL | Status: DC
  Administered 2016-01-27 – 2016-01-30 (×4): 20 mg via ORAL
  Filled 2016-01-27 (×4): qty 1

## 2016-01-27 MED ORDER — PRAVASTATIN SODIUM 20 MG PO TABS
20.0000 mg | ORAL_TABLET | Freq: Every day | ORAL | Status: DC
  Administered 2016-01-27 – 2016-01-30 (×4): 20 mg via ORAL
  Filled 2016-01-27 (×5): qty 1

## 2016-01-27 MED ORDER — PANTOPRAZOLE SODIUM 40 MG PO TBEC
80.0000 mg | DELAYED_RELEASE_TABLET | Freq: Every day | ORAL | Status: DC
  Administered 2016-01-27 – 2016-01-30 (×4): 80 mg via ORAL
  Filled 2016-01-27 (×4): qty 2

## 2016-01-27 NOTE — ED Notes (Signed)
Writer spoke with Madison [name] in the Main lab in regards to pt lab values as they compare to I- Stat values.  Was informed that the only way to be sure of value accuracy was to put in for a recollect.

## 2016-01-27 NOTE — ED Notes (Signed)
Pt comes to Ed via Ems, pt is confused, tachycardia, CBG high over 600, weak, hr 110. 500 cc given and 20 rfa, Right pupil 4, left pupil 3, pt has a baseline of slurred speech. Hx of 6 strokes in the past. Hx of hypertension, 102/44, hr 117, rr 30.  Pt comes from home has child at bed side.

## 2016-01-27 NOTE — Progress Notes (Signed)
ANTICOAGULATION CONSULT NOTE - Initial Consult  Pharmacy Consult for IV heparin/warfarin Indication: Mechanical AVR w/ low INR  Allergies  Allergen Reactions  . Other Other (See Comments)    Can burn skin if left on too long.TAPE  . Celebrex [Celecoxib] Rash  . Detrol [Tolterodine] Hives    Patient Measurements:   Heparin Dosing Weight: 56 kg  Vital Signs: Temp: 98 F (36.7 C) (03/10 0209) Temp Source: Oral (03/10 0209) BP: 104/48 mmHg (03/10 0209) Pulse Rate: 116 (03/10 0209)  Labs:  Recent Labs  01/24/16 0536 01/26/16 01/27/16 0231 01/27/16 0246 01/27/16 0258  HGB  --   --   --  9.5* 8.1*  HCT  --   --   --  28.0* 27.2*  PLT  --   --   --   --  211  LABPROT 24.6*  --   --   --   --   INR 2.24* 1.5  --   --   --   CREATININE 0.58  --  1.54* 0.70  --     Estimated Creatinine Clearance: 72.6 mL/min (by C-G formula based on Cr of 0.7).   Medical History: Past Medical History  Diagnosis Date  . CAD (coronary artery disease)   . Obesity   . Hypercholesteremia   . HTN (hypertension)   . Dyslipidemia   . Aortic stenosis   . Thrombophlebitis   . Heart murmur   . Myocardial infarction (HCC) 12/2014  . Pneumonia 11/2014; 12/2014  . GERD (gastroesophageal reflux disease)   . Stroke syndrome [facility]) 1995; 2001    "when my son was born; problems w/speech and L hand since then" (01/19/2015)  . Anxiety   . Depression   . Allergy   . CHF (congestive heart failure) (HCC)   . Rheumatoid arthritis(714.0)     "hands" (07/01/2015)  . Diabetes (HCC) dx'd 1982    "went straight to insulin"    Medications:  Scheduled:  . aspirin  81 mg Oral Daily  . darifenacin  7.5 mg Oral Daily  . escitalopram  20 mg Oral Daily  . heparin  5,000 Units Subcutaneous 3 times per day  . pantoprazole  80 mg Oral Daily  . pravastatin  20 mg Oral Daily   Infusions:  . sodium chloride    . sodium chloride    . sodium chloride    . dextrose 5 % and 0.45% NaCl    . dextrose 5 % and 0.45%  NaCl    . insulin (NOVOLIN-R) infusion      Assessment: 33 yoF admitted with confusion, tachycardia, elevated CBGs and weakness on chronic warfarin for AVR.  IV heparin and warfarin per Rx for subtherapeutic INR. Goal of Therapy:  INR 2.5-3.5 and HL=0.3-0.7 units/ml Monitor platelets by anticoagulation protocol: Yes   Plan:   Baseline coags stat- delay in drawing of labs d/t to difficult stick  Will dose Warfarin and Heparin when baseline coags are drawn  Lorenza Evangelist 01/27/2016,4:04 AM

## 2016-01-27 NOTE — H&P (Signed)
[facility] History and Physical  [name]:[id number] DOB: [birth year] DOA: 01/27/2016  Referring physician: EDP PCP: [name], NP   Chief Complaint: Hyperglycemia, AMS   HPI: Madison [name] is a 55 y.o. female with h/o AVR with mechanical valve.  DM1, poorly controlled, just discharged from our service a couple of days ago (3/4-3/7) for DKA.  Patient presents to the ED with N/V, AMS.  Found once again to be in DKA with anion gap of 28 this evening.  Review of Systems: Systems reviewed.  As above, otherwise negative  Past Medical History  Diagnosis Date  . CAD (coronary artery disease)   . Obesity   . Hypercholesteremia   . HTN (hypertension)   . Dyslipidemia   . Aortic stenosis   . Thrombophlebitis   . Heart murmur   . Myocardial infarction (HCC) 12/2014  . Pneumonia 11/2014; 12/2014  . GERD (gastroesophageal reflux disease)   . Stroke syndrome [facility]) 1995; 2001    "when my son was born; problems w/speech and L hand since then" (01/19/2015)  . Anxiety   . Depression   . Allergy   . CHF (congestive heart failure) (HCC)   . Rheumatoid arthritis(714.0)     "hands" (07/01/2015)  . Diabetes (HCC) dx'd 1982    "went straight to insulin"   Past Surgical History  Procedure Laterality Date  . Cesarean section  1995  . Foot fracture surgery    . Knee arthroscopy Right   . Neuroplasty / transposition median nerve at carpal tunnel    . Left heart catheterization with coronary angiogram N/A 12/08/2014    Procedure: LEFT HEART CATHETERIZATION WITH CORONARY ANGIOGRAM;  Surgeon: Iran Ouch, MD;  Location: [facility] CATH LAB;  Service: Cardiovascular;  Laterality: N/A;  . Percutaneous coronary stent intervention (pci-s) N/A 12/09/2014    Procedure: PERCUTANEOUS CORONARY STENT INTERVENTION (PCI-S);  Surgeon: [name], MD;  Location: [facility] CATH LAB;  Service: Cardiovascular;  Laterality: N/A;  . Cardiac catheterization  12/08/2014  . Coronary angioplasty with stent  placement  12/09/2014  . Aortic valve replacement (avr)/coronary artery bypass grafting (cabg)  "2014"    [name] 03/08/2014  . Fracture surgery    . Tubal ligation  1995  . Cataract extraction Left   . Coronary artery bypass graft    . Orif ankle fracture Left 07/04/2015    Procedure: OPEN REDUCTION INTERNAL FIXATION (ORIF)  TRIMAL ANKLE FRACTURE;  Surgeon: [name], MD;  Location: [facility] OR;  Service: Orthopedics;  Laterality: Left;   Social History:  reports that she has never smoked. She has never used smokeless tobacco. She reports that she does not drink alcohol or use illicit drugs.  Allergies  Allergen Reactions  . Other Other (See Comments)    Can burn skin if left on too long.TAPE  . Celebrex [Celecoxib] Rash  . Detrol [Tolterodine] Hives    Family History  Problem Relation Age of Onset  . Stroke    . Heart disease Mother   . Heart disease Sister      Prior to Admission medications   Medication Sig Start Date End Date Taking? Authorizing Provider  acetaminophen (TYLENOL) 325 MG tablet Take 2 tablets (650 mg total) by mouth every 6 (six) hours as needed for mild pain (temp > 101.5). 08/09/15   Starleen Arms, MD  aspirin 81 MG tablet Take 1 tablet (81 mg total) by mouth daily. 10/11/14   Leana Roe Elgergawy, MD  escitalopram (LEXAPRO) 20 MG  tablet Take 1 tablet (20 mg total) by mouth daily. 11/03/15   [name], NP  insulin aspart protamine - aspart (NOVOLOG MIX 70/30 FLEXPEN) (70-30) 100 UNIT/ML FlexPen Inject 0.25 mLs (25 Units total) into the skin 2 (two) times daily. And pen needles 1/day 01/24/16   [name], MD  lisinopril (PRINIVIL,ZESTRIL) 2.5 MG tablet  10/22/15   Historical Provider, MD  omeprazole (PRILOSEC) 40 MG capsule TAKE 1 CAPSULE (40 MG TOTAL) BY MOUTH DAILY. 09/12/15   [name], NP  ondansetron (ZOFRAN) 4 MG tablet Take 1 tablet (4 mg total) by mouth every 8 (eight) hours as needed for nausea or vomiting. 01/04/16   [name], NP  pravastatin  (PRAVACHOL) 20 MG tablet Take 1 tablet (20 mg total) by mouth daily. 08/17/15   [name], NP  solifenacin (VESICARE) 5 MG tablet Take 1 tablet (5 mg total) by mouth daily. 05/03/15   [name], NP  warfarin (COUMADIN) 7.5 MG tablet Take 1 tablet daily or as directed by coumadin clinic. 01/26/16   [name], NP   Physical Exam: Filed Vitals:   01/27/16 0209  BP: 104/48  Pulse: 116  Temp: 98 F (36.7 C)  Resp: 18    BP 104/48 mmHg  Pulse 116  Temp(Src) 98 F (36.7 C) (Oral)  Resp 18  SpO2 95%  General Appearance:    Lethargic, oriented, no distress, appears stated age  Head:    Normocephalic, atraumatic  Eyes:    Unequal pupils which patient confirms is chronic.     Nose:   Nares without drainage or epistaxis. Mucosa, turbinates normal  Throat:   Moist mucous membranes. Oropharynx without erythema or exudate.  Neck:   Supple. No carotid bruits.  No thyromegaly.  No lymphadenopathy.   Back:     No CVA tenderness, no spinal tenderness  Lungs:     Clear to auscultation bilaterally, without wheezes, rhonchi or rales  Chest wall:    No tenderness to palpitation  Heart:    Regular rate and rhythm without murmurs, gallops, rubs  Abdomen:     Soft, non-tender, nondistended, normal bowel sounds, no organomegaly  Genitalia:    deferred  Rectal:    deferred  Extremities:   No clubbing, cyanosis or edema.  Pulses:   2+ and symmetric all extremities  Skin:   Skin color, texture, turgor normal, no rashes or lesions  Lymph nodes:   Cervical, supraclavicular, and axillary nodes normal  Neurologic:   CNII-XII intact. Normal strength, sensation and reflexes      throughout    Labs on Admission:  Basic Metabolic Panel:  Recent Labs Lab 01/22/16 0211 01/22/16 0715 01/23/16 0612 01/24/16 0536 01/27/16 0231 01/27/16 0246  NA 135 137 136 140 126* 135  K 4.1 3.9 3.7 3.5 5.7* 4.5  CL 105 112* 111 111 91* 105  CO2 11* 17* 21* 23 7*  --   GLUCOSE 348* 109* 211* 61* 914* >700*   BUN 17 12 <5* <5* 24* 22*  CREATININE 1.31* 0.81 0.71 0.58 1.54* 0.70  CALCIUM 8.4* 7.9* 8.1* 8.0* 8.4*  --    Liver Function Tests:  Recent Labs Lab 01/21/16 2257 01/27/16 0231  AST 17 32  ALT 16 28  ALKPHOS 91 98  BILITOT 1.7* 2.0*  PROT 6.9 6.8  ALBUMIN 3.6 3.6    Recent Labs Lab 01/21/16 2257  LIPASE 15   No results for input(s): AMMONIA in the last 168 hours. CBC:  Recent Labs Lab 01/21/16 2257  01/23/16 0612 01/27/16 0246 01/27/16 0258  WBC 9.8 6.6  --  14.0*  NEUTROABS 7.4  --   --  12.9*  HGB 9.9* 9.1* 9.5* 8.1*  HCT 31.2* 28.9* 28.0* 27.2*  MCV 79.8 79.8  --  84.5  PLT 308 235  --  211   Cardiac Enzymes: No results for input(s): CKTOTAL, CKMB, CKMBINDEX, TROPONINI in the last 168 hours.  BNP (last 3 results) No results for input(s): PROBNP in the last 8760 hours. CBG:  Recent Labs Lab 01/23/16 1654 01/23/16 2122 01/24/16 0729 01/24/16 1203 01/27/16 0218  GLUCAP 284* 230* 138* 90 >600*    Radiological Exams on Admission: Dg Chest Portable 1 View  01/27/2016  CLINICAL DATA:  Diabetic ketoacidosis.  Confusion and tachycardia. EXAM: PORTABLE CHEST 1 VIEW COMPARISON:  11/23/2015 FINDINGS: Patient is post median sternotomy. There is a prosthetic aortic valve. Heart is normal in size. No pulmonary edema, consolidation, pleural effusion or pneumothorax. IMPRESSION: No acute process.  Post median sternotomy. Electronically Signed   By: Rubye Oaks M.D.   On: 01/27/2016 03:25    EKG: Independently reviewed.  Assessment/Plan Principal Problem:   DKA (diabetic ketoacidoses) (HCC) Active Problems:   DKA, type 1 (HCC)   CHF (congestive heart failure) (HCC)   1. DKA type 1 - 1. DKA pathway 2. Insulin gtt per pathway 3. IVF 4. Serial BMPs per protocol 5. VBG pending to check pH, start Bicarb gtt if pH is less than 7.0 2. H/o mechanical AVR - 1. Coumadin sub-theraptuic 2. Putting patient on heparin gtt, bridge to coumadin, pharm to  dose 3. H/o CHF - watch for fluid overload with resuscitation  Code Status: Full  Family Communication: No adult family in room, 73 yr old child in room with patient, CPS has been contacted by ED RN, have put in SW consult Disposition Plan: Admit to inpatient   Time spent: 70 min  Bryen Hinderman M. [facility] Pager [phone]  If 7AM-7PM, please contact the day team taking care of the patient [email or web address] Password [email or web address] 01/27/2016, 4:02 AM

## 2016-01-27 NOTE — Progress Notes (Signed)
ANTICOAGULATION CONSULT NOTE - Follow up  Pharmacy Consult for IV heparin/warfarin Indication: Mechanical AVR w/ low INR  Second heparin level is therapeutic (0.62) on 950 units/hr. Note, drawn ~1hr early No bleeding reported.  Plan:  Continue current rate  Check daily heparin level and CBC  Loralee Pacas, PharmD, BCPS. Pager: [phone] 01/27/2016 9:53 PM

## 2016-01-27 NOTE — Progress Notes (Signed)
Patient having soft blood pressures MD paged and ntified. MD wishes to continue with plan of care and continue to monitor.

## 2016-01-27 NOTE — Progress Notes (Signed)
Patient seen and examined. Admitted after midnight secondary to N/V and AMS. Found to be in DKA, with A. Gap of 28. Patient with multiple admissions due to hyperglycemia and DKA; just discharged on 01/24/16 due to the same. Please refer to H&P written by Dr. Julian Reil for further info/details on admission.  Plan: -continue DKA protocol -NPO, IV insulin, electrolytes repletion, strict intake and output, follow BMET/CBG's closely -plan is to stop IV insulin when CBG's < 200 for more than 4 occasions, Gap is < 12 and CO2 > 19  Vassie Loll [phone]

## 2016-01-27 NOTE — ED Notes (Signed)
Bed: WA17 Expected date:  Expected time:  Means of arrival:  Comments: EMS 

## 2016-01-27 NOTE — ED Notes (Signed)
RN to get labs when IV team starts IV.

## 2016-01-27 NOTE — Progress Notes (Signed)
ANTICOAGULATION CONSULT NOTE - Follow up  Pharmacy Consult for IV heparin/warfarin Indication: Mechanical AVR w/ low INR  Please see progress note from Matthew Folks, PharmD for full details.  First heparin level is therapeutic (0.54) on 950 units/hr. No bleeding reported.  Plan:  Continue current rate  Recheck level in 6hrs to verify therapeutic  Loralee Pacas, PharmD, BCPS. Pager: [phone] 01/27/2016 4:04 PM

## 2016-01-27 NOTE — Progress Notes (Signed)
Patient arrived from ED with her 55 year old son at bedside.  Patient has no resources or family for her son Madison [name]) to stay with while she is in the hospital.  Madison [name] has recently been placed in her custody (30 days) and has an ongoing case with DSS in [place].  Contacted by case worker in [place] [phone]) regarding patient's and son's status.  Received verbal permission from patient that Madison [name] can get updates regarding her medical condition.  Per Madison [name], he has been in contact with, and is working with [facility] DSS to find temporary placement for Madison [name] while patient is receiving medical care.  Madison [name] is also in contact with the social worker, Madison [name], at [employer] where [place] attends school.  All parties are in agreement that Madison [name] is safer at school at this time.  Madison Overlie, RN (ED) agreeable to drive Madison [name] to school, and patient gives verbal permission.  Social worker with [facility], [place], is to arrive at hospital to discuss further plans with [name] ([name]).

## 2016-01-27 NOTE — Progress Notes (Signed)
   01/27/16 1200  Clinical Encounter Type  Visited With Patient;Health care provider  Visit Type Initial;Psychological support;Spiritual support;Critical Care  Referral From Nurse  Consult/Referral To Chaplain  Spiritual Encounters  Spiritual Needs Other (Comment)  Stress Factors  Patient Stress Factors None identified    Patient was asleep upon my arrival. When asked if she would like me to come back at another time she stated yes.  I spoke with the nurse who let me know the patient's situation. Follow-up is needed.    Chaplain Clint Bolder M.Div.

## 2016-01-27 NOTE — ED Notes (Addendum)
CPS contacted about care of minor child in patient's care.  Child is unable to go to ICU with patient, therefore patient unable to go to unit until care can be provided for child.  CPS states they can not take the child because there is no evidence of abuse or neglect. Will await Social worker and case management.  Child has been fed 2 sandwiches and juice, has been provided with coloring materials and a geri- chair with pillow and blanket to rest. Patient states the child is her ex-husband's child with another woman and they lived in [place]. Minor child states his Father put him in foster care because "he did not want me anymore". Patient states she got the child from foster care in [place] and he has only been in [place] for a few weeks.

## 2016-01-27 NOTE — Progress Notes (Signed)
Hypoglycemic Event  CBG: 55  Treatment: D50 IV 50 mL  Symptoms: None  Follow-up CBG: Time:2340 CBG Result:144  Possible Reasons for Event: Unknown  Comments/MD notified:T.Claiborne Billings, NP    Jenelle Mages, Teria Khachatryan R

## 2016-01-27 NOTE — Progress Notes (Signed)
ANTICOAGULATION CONSULT NOTE - Follow up  Pharmacy Consult for IV heparin/warfarin Indication: Mechanical AVR w/ low INR  Allergies  Allergen Reactions  . Other Other (See Comments)    Can burn skin if left on too long.TAPE  . Celebrex [Celecoxib] Rash  . Detrol [Tolterodine] Hives    Patient Measurements:    Heparin Dosing Weight: 66kg  Vital Signs: Temp: 98.2 F (36.8 C) (03/10 0737) Temp Source: Oral (03/10 0209) BP: 98/49 mmHg (03/10 0800) Pulse Rate: 109 (03/10 0800)  Labs:  Recent Labs  01/26/16 01/27/16 0231 01/27/16 0246 01/27/16 0258 01/27/16 0759  HGB  --   --  9.5* 8.1*  --   HCT  --   --  28.0* 27.2*  --   PLT  --   --   --  211  --   LABPROT  --   --   --   --  17.9*  INR 1.5  --   --   --  1.53*  CREATININE  --  1.54* 0.70  --  1.40*    Estimated Creatinine Clearance: 41.5 mL/min (by C-G formula based on Cr of 1.4).  Assessment: 55yo F admitted with confusion, tachycardia, elevated CBGs and weakness on chronic warfarin for AVR. Patient is known to pharmacy from recent admission 3/4-3/7 on Lovenox and warfarin. Discharged on warfarin 7.5mg  daily, last taken on 3/9. Pharmacy is asked to dose warfarin and heparin as bridge therapy.  Goal of Therapy:  INR = 2.5-3.5 HL = 0.3-0.7 units/ml Monitor platelets by anticoagulation protocol: Yes   Plan:  Give heparin 1500units IV bolus then start infusion at 950units/hr. Give warfarin 10mg  PO x 1 this AM. Check heparin level in 6hrs and daily thereafter. Check INR and CBC q24h. F/u daily.  , PharmD, pager [phone]. 01/27/2016,8:57 AM.   03/28/2016, PharmD, pager [phone]. 01/27/2016,8:55 AM.

## 2016-01-27 NOTE — Progress Notes (Signed)
MD notified of new labs and CBGs being within range and notified of low BP.

## 2016-01-27 NOTE — Patient Outreach (Signed)
Attempt made to contact pt for Transition of care as this RN CM covering for Pam (pt's primary RN CM).   Message on pt's phone- not available at this time, unable to leave a voice message.   Was informed by Pam RN CM called pt 3 times prior, could not reach her.   Will relay to [facility] RN CM unable to contact pt.     Shayne Alken.   Pierzchala RN CCM [facility] Care Management  [phone]

## 2016-01-27 NOTE — ED Provider Notes (Signed)
CSN: [id number]     Arrival date & time 01/27/16  0209 History  By signing my name below, I, Iona Beard, attest that this documentation has been prepared under the direction and in the presence of Joette Schmoker, MD.   Electronically Signed: Iona Beard, ED Scribe. 01/27/2016. 2:10 AM   Chief Complaint  Patient presents with  . Hyperglycemia  . Altered Mental Status    Patient is a 55 y.o. female presenting with hyperglycemia and altered mental status. The history is provided by a relative. The history is limited by the condition of the patient. No language interpreter was used.  Hyperglycemia Severity:  Severe Onset quality:  Gradual Timing:  Constant Progression:  Worsening Chronicity:  Chronic Diabetes status:  Controlled with insulin Context: recent illness   Context: not change in medication   Relieved by:  Nothing Ineffective treatments:  None tried Associated symptoms: altered mental status and vomiting   Risk factors: hx of DKA   Altered Mental Status Associated symptoms: vomiting   LEVEL 5 CAVEAT DUE TO UNRESPONSIVENESS  HPI Comments: Madison [name] is a 55 y.o. female who presents to the Emergency Department complaining of hyperglycemia and emesis. HPI unable to be obtained due to unresponsiveness.    Past Medical History  Diagnosis Date  . CAD (coronary artery disease)   . Obesity   . Hypercholesteremia   . HTN (hypertension)   . Dyslipidemia   . Aortic stenosis   . Thrombophlebitis   . Heart murmur   . Myocardial infarction (HCC) 12/2014  . Pneumonia 11/2014; 12/2014  . GERD (gastroesophageal reflux disease)   . 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 Anxiety   . Depression   . Allergy   . CHF (congestive heart failure) (HCC)   . Rheumatoid arthritis(714.0)     "hands" (07/01/2015)  . Diabetes (HCC) dx'd 1982    "went straight to insulin"   Past Surgical History   Procedure Laterality Date  . Cesarean section  1995  . Foot fracture surgery    . Knee arthroscopy Right   . Neuroplasty / transposition median nerve at carpal tunnel    . Left heart catheterization with coronary angiogram N/A 12/08/2014    Procedure: LEFT HEART CATHETERIZATION WITH CORONARY ANGIOGRAM;  Surgeon: Iran Ouch, MD;  Location: [facility] CATH LAB;  Service: Cardiovascular;  Laterality: N/A;  . Percutaneous coronary stent intervention (pci-s) N/A 12/09/2014    Procedure: PERCUTANEOUS CORONARY STENT INTERVENTION (PCI-S);  Surgeon: [name], MD;  Location: [facility] CATH LAB;  Service: Cardiovascular;  Laterality: N/A;  . Cardiac catheterization  12/08/2014  . Coronary angioplasty with stent placement  12/09/2014  . Aortic valve replacement (avr)/coronary artery bypass grafting (cabg)  "2014"    [name] 03/08/2014  . Fracture surgery    . Tubal ligation  1995  . Cataract extraction Left   . Coronary artery bypass graft    . Orif ankle fracture Left 07/04/2015    Procedure: OPEN REDUCTION INTERNAL FIXATION (ORIF)  TRIMAL ANKLE FRACTURE;  Surgeon: [name], MD;  Location: [facility] OR;  Service: Orthopedics;  Laterality: Left;   Family History  Problem Relation Age of Onset  . Stroke    . Heart disease Mother   . Heart disease Sister    Social History  Substance Use Topics  . Smoking status: Never Smoker   . Smokeless tobacco: Never Used  . Alcohol  Use: No   OB History    No data available     Review of Systems  Unable to perform ROS: Mental status change  Gastrointestinal: Positive for vomiting.  All other systems reviewed and are negative.    Allergies  Other; Celebrex; and Detrol  Home Medications   Prior to Admission medications   Medication Sig Start Date End Date Taking? Authorizing Provider  acetaminophen (TYLENOL) 325 MG tablet Take 2 tablets (650 mg total) by mouth every 6 (six) hours as needed for mild pain (temp > 101.5). 08/09/15   Starleen Arms, MD   aspirin 81 MG tablet Take 1 tablet (81 mg total) by mouth daily. 10/11/14   Leana Roe Elgergawy, MD  escitalopram (LEXAPRO) 20 MG tablet Take 1 tablet (20 mg total) by mouth daily. 11/03/15   [name], NP  insulin aspart protamine - aspart (NOVOLOG MIX 70/30 FLEXPEN) (70-30) 100 UNIT/ML FlexPen Inject 0.25 mLs (25 Units total) into the skin 2 (two) times daily. And pen needles 1/day 01/24/16   [name], MD  lisinopril (PRINIVIL,ZESTRIL) 2.5 MG tablet  10/22/15   Historical Provider, MD  omeprazole (PRILOSEC) 40 MG capsule TAKE 1 CAPSULE (40 MG TOTAL) BY MOUTH DAILY. 09/12/15   [name], NP  ondansetron (ZOFRAN) 4 MG tablet Take 1 tablet (4 mg total) by mouth every 8 (eight) hours as needed for nausea or vomiting. 01/04/16   [name], NP  pravastatin (PRAVACHOL) 20 MG tablet Take 1 tablet (20 mg total) by mouth daily. 08/17/15   [name], NP  solifenacin (VESICARE) 5 MG tablet Take 1 tablet (5 mg total) by mouth daily. 05/03/15   [name], NP  warfarin (COUMADIN) 7.5 MG tablet Take 1 tablet daily or as directed by coumadin clinic. 01/26/16   Cory Nafziger, NP   BP 104/48 mmHg  Pulse 116  Temp(Src) 98 F (36.7 C) (Oral)  Resp 18  SpO2 95% Physical Exam  Constitutional: She appears well-developed and well-nourished.  HENT:  Head: Normocephalic and atraumatic.  Mouth/Throat: Mucous membranes are dry. No oropharyngeal exudate.  Eyes: Conjunctivae and EOM are normal.  Anisocoria. Pupil on left is smaller than right. Both reactive.   Neck: Normal range of motion. Neck supple. No tracheal deviation present.  No bruits.  Cardiovascular: Regular rhythm and normal heart sounds.  Tachycardia present.   Pulmonary/Chest: Effort normal and breath sounds normal. No stridor. No respiratory distress. She has no wheezes. She has no rales.  Abdominal: Soft. Bowel sounds are normal. She exhibits no distension and no mass. There is no tenderness. There is no rebound and no guarding.   Musculoskeletal: Normal range of motion.  Neurological: She is alert. She has normal reflexes. She displays normal reflexes.  Skin: Skin is warm and dry. She is not diaphoretic.  No lesions.   Psychiatric: She has a normal mood and affect.    ED Course  Procedures (including critical care time) DIAGNOSTIC STUDIES: Oxygen Saturation is 95% on RA, normal by my interpretation.    COORDINATION OF CARE: 2:10 AM-Discussed treatment plan which includes BMP, CBC, urinalysis, and CBG monitoring with pt at bedside and pt agreed to plan.    Labs Review Labs Reviewed - No data to display  Imaging Review No results found. I have personally reviewed and evaluated these lab results as part of my medical decision-making.   EKG Interpretation None      MDM   Final diagnoses:  None   Results for orders placed or performed  during the hospital encounter of 01/27/16  Urinalysis, Routine w reflex microscopic (not at [facility])  Result Value Ref Range   Color, Urine YELLOW YELLOW   APPearance CLEAR CLEAR   Specific Gravity, Urine 1.023 1.005 - 1.030   pH 5.0 5.0 - 8.0   Glucose, UA >1000 (A) NEGATIVE mg/dL   Hgb urine dipstick NEGATIVE NEGATIVE   Bilirubin Urine NEGATIVE NEGATIVE   Ketones, ur >80 (A) NEGATIVE mg/dL   Protein, ur NEGATIVE NEGATIVE mg/dL   Nitrite NEGATIVE NEGATIVE   Leukocytes, UA NEGATIVE NEGATIVE  Comprehensive metabolic panel  Result Value Ref Range   Sodium 126 (L) 135 - 145 mmol/L   Potassium 5.7 (H) 3.5 - 5.1 mmol/L   Chloride 91 (L) 101 - 111 mmol/L   CO2 7 (L) 22 - 32 mmol/L   Glucose, Bld 914 (HH) 65 - 99 mg/dL   BUN 24 (H) 6 - 20 mg/dL   Creatinine, Ser 8.41 (H) 0.44 - 1.00 mg/dL   Calcium 8.4 (L) 8.9 - 10.3 mg/dL   Total Protein 6.8 6.5 - 8.1 g/dL   Albumin 3.6 3.5 - 5.0 g/dL   AST 32 15 - 41 U/L   ALT 28 14 - 54 U/L   Alkaline Phosphatase 98 38 - 126 U/L   Total Bilirubin 2.0 (H) 0.3 - 1.2 mg/dL   GFR calc non Af Amer 37 (L) >60 mL/min   GFR calc Af  Amer 43 (L) >60 mL/min   Anion gap 28 (H) 5 - 15  CBC with Differential/Platelet  Result Value Ref Range   WBC 14.0 (H) 4.0 - 10.5 K/uL   RBC 3.22 (L) 3.87 - 5.11 MIL/uL   Hemoglobin 8.1 (L) 12.0 - 15.0 g/dL   HCT 32.4 (L) 40.1 - 02.7 %   MCV 84.5 78.0 - 100.0 fL   MCH 25.2 (L) 26.0 - 34.0 pg   MCHC 29.8 (L) 30.0 - 36.0 g/dL   RDW 25.3 (H) 66.4 - 40.3 %   Platelets 211 150 - 400 K/uL   Neutrophils Relative % 92 %   Lymphocytes Relative 5 %   Monocytes Relative 3 %   Eosinophils Relative 0 %   Basophils Relative 0 %   Neutro Abs 12.9 (H) 1.7 - 7.7 K/uL   Lymphs Abs 0.7 0.7 - 4.0 K/uL   Monocytes Absolute 0.4 0.1 - 1.0 K/uL   Eosinophils Absolute 0.0 0.0 - 0.7 K/uL   Basophils Absolute 0.0 0.0 - 0.1 K/uL   RBC Morphology POLYCHROMASIA PRESENT   Urine microscopic-add on  Result Value Ref Range   Squamous Epithelial / LPF 0-5 (A) NONE SEEN   WBC, UA 6-30 0 - 5 WBC/hpf   RBC / HPF NONE SEEN 0 - 5 RBC/hpf   Bacteria, UA RARE (A) NONE SEEN  CBG monitoring, ED  Result Value Ref Range   Glucose-Capillary >600 (HH) 65 - 99 mg/dL  I-stat chem 8, ed  Result Value Ref Range   Sodium 135 135 - 145 mmol/L   Potassium 4.5 3.5 - 5.1 mmol/L   Chloride 105 101 - 111 mmol/L   BUN 22 (H) 6 - 20 mg/dL   Creatinine, Ser 4.74 0.44 - 1.00 mg/dL   Glucose, Bld >259 (HH) 65 - 99 mg/dL   Calcium, Ion 5.63 (L) 1.12 - 1.23 mmol/L   TCO2 9 0 - 100 mmol/L   Hemoglobin 9.5 (L) 12.0 - 15.0 g/dL   HCT 87.5 (L) 64.3 - 32.9 %   Comment NOTIFIED PHYSICIAN  I-stat troponin, ED  Result Value Ref Range   Troponin i, poc 0.02 0.00 - 0.08 ng/mL   Comment 3          CBG monitoring, ED  Result Value Ref Range   Glucose-Capillary >600 (HH) 65 - 99 mg/dL   Dg Chest Portable 1 View  01/27/2016  CLINICAL DATA:  Diabetic ketoacidosis.  Confusion and tachycardia. EXAM: PORTABLE CHEST 1 VIEW COMPARISON:  11/23/2015 FINDINGS: Patient is post median sternotomy. There is a prosthetic aortic valve. Heart is normal  in size. No pulmonary edema, consolidation, pleural effusion or pneumothorax. IMPRESSION: No acute process.  Post median sternotomy. Electronically Signed   By: Rubye Oaks M.D.   On: 01/27/2016 03:25    Medications  insulin regular (NOVOLIN R,HUMULIN R) 250 Units in sodium chloride 0.9 % 250 mL (1 Units/mL) infusion (not administered)  0.9 %  sodium chloride infusion (not administered)  dextrose 5 %-0.45 % sodium chloride infusion (not administered)  darifenacin (ENABLEX) 24 hr tablet 7.5 mg (not administered)  pravastatin (PRAVACHOL) tablet 20 mg (not administered)  pantoprazole (PROTONIX) EC tablet 80 mg (not administered)  ondansetron (ZOFRAN) tablet 4 mg (not administered)  escitalopram (LEXAPRO) tablet 20 mg (not administered)  aspirin tablet 81 mg (not administered)  acetaminophen (TYLENOL) tablet 650 mg (not administered)  0.9 %  sodium chloride infusion (not administered)  0.9 %  sodium chloride infusion (not administered)  dextrose 5 %-0.45 % sodium chloride infusion (not administered)  heparin injection 5,000 Units (not administered)  sodium chloride 0.9 % bolus 1,000 mL (0 mLs Intravenous Stopped 01/27/16 0350)   MDM Reviewed: previous chart, nursing note and vitals Reviewed previous: labs Interpretation: labs, ECG and x-ray (anion gap 28, WBC 14 and elevated NACPD by me on CXR) Total time providing critical care: 75-105 minutes. This excludes time spent performing separately reportable procedures and services. Consults: admitting MD  CRITICAL CARE Performed by: Jasmine Awe Total critical care time: 90 minutes Critical care time was exclusive of separately billable procedures and treating other patients. Critical care was necessary to treat or prevent imminent or life-threatening deterioration. Critical care was time spent personally by me on the following activities: development of treatment plan with patient and/or surrogate as well as nursing, discussions  with consultants, evaluation of patient's response to treatment, examination of patient, obtaining history from patient or surrogate, ordering and performing treatments and interventions, ordering and review of laboratory studies, ordering and review of radiographic studies, pulse oximetry and re-evaluation of patient's condition.   Will admit to step down for DKA  I personally performed the services described in this documentation, which was scribed in my presence. The recorded information has been reviewed and is accurate.       Cy Blamer, MD 01/27/16 [phone]

## 2016-01-27 NOTE — Care Management Note (Signed)
Case Management Note  Patient Details  Name: Madison [name] MRN: [id number] Date of Birth: 1961-01-21  Subjective/Objective:         dka           Action/Plan:Date:  January 27, 2016 Chart reviewed for concurrent status and case management needs. Will continue to follow patient for changes and needs: Marcelle Smiling, BSN, RN, [place]   [phone]   Expected Discharge Date:                  Expected Discharge Plan:  Home/Self Care  In-House Referral:  NA  Discharge planning Services  CM Consult  Post Acute Care Choice:  NA Choice offered to:  NA  DME Arranged:    DME Agency:     HH Arranged:    HH Agency:     Status of Service:  In process, will continue to follow  Medicare Important Message Given:    Date Medicare IM Given:    Medicare IM give by:    Date Additional Medicare IM Given:    Additional Medicare Important Message give by:     If discussed at Long Length of Stay Meetings, dates discussed:    Additional Comments:  Golda Acre, RN 01/27/2016, 9:54 AM

## 2016-01-28 DIAGNOSIS — I5042 Chronic combined systolic (congestive) and diastolic (congestive) heart failure: Secondary | ICD-10-CM

## 2016-01-28 DIAGNOSIS — K219 Gastro-esophageal reflux disease without esophagitis: Secondary | ICD-10-CM

## 2016-01-28 DIAGNOSIS — F329 Major depressive disorder, single episode, unspecified: Secondary | ICD-10-CM

## 2016-01-28 DIAGNOSIS — Z954 Presence of other heart-valve replacement: Secondary | ICD-10-CM

## 2016-01-28 DIAGNOSIS — E876 Hypokalemia: Secondary | ICD-10-CM

## 2016-01-28 DIAGNOSIS — E785 Hyperlipidemia, unspecified: Secondary | ICD-10-CM

## 2016-01-28 LAB — BASIC METABOLIC PANEL
ANION GAP: 5 (ref 5–15)
BUN: 12 mg/dL (ref 6–20)
CALCIUM: 7.4 mg/dL — AB (ref 8.9–10.3)
CO2: 18 mmol/L — ABNORMAL LOW (ref 22–32)
CREATININE: 0.74 mg/dL (ref 0.44–1.00)
Chloride: 114 mmol/L — ABNORMAL HIGH (ref 101–111)
Glucose, Bld: 56 mg/dL — ABNORMAL LOW (ref 65–99)
Potassium: 2.8 mmol/L — ABNORMAL LOW (ref 3.5–5.1)
SODIUM: 137 mmol/L (ref 135–145)

## 2016-01-28 LAB — CBC
HCT: 26.6 % — ABNORMAL LOW (ref 36.0–46.0)
Hemoglobin: 8.3 g/dL — ABNORMAL LOW (ref 12.0–15.0)
MCH: 25 pg — ABNORMAL LOW (ref 26.0–34.0)
MCHC: 31.2 g/dL (ref 30.0–36.0)
MCV: 80.1 fL (ref 78.0–100.0)
PLATELETS: 241 10*3/uL (ref 150–400)
RBC: 3.32 MIL/uL — ABNORMAL LOW (ref 3.87–5.11)
RDW: 18.4 % — AB (ref 11.5–15.5)
WBC: 16.5 10*3/uL — AB (ref 4.0–10.5)

## 2016-01-28 LAB — GLUCOSE, CAPILLARY
GLUCOSE-CAPILLARY: 114 mg/dL — AB (ref 65–99)
GLUCOSE-CAPILLARY: 57 mg/dL — AB (ref 65–99)
GLUCOSE-CAPILLARY: 66 mg/dL (ref 65–99)
Glucose-Capillary: 21 mg/dL — CL (ref 65–99)
Glucose-Capillary: 118 mg/dL — ABNORMAL HIGH (ref 65–99)
Glucose-Capillary: 104 mg/dL — ABNORMAL HIGH (ref 65–99)
Glucose-Capillary: 134 mg/dL — ABNORMAL HIGH (ref 65–99)

## 2016-01-28 LAB — PROTIME-INR
INR: 2.67 — AB (ref 0.00–1.49)
PROTHROMBIN TIME: 27.2 s — AB (ref 11.6–15.2)

## 2016-01-28 LAB — MAGNESIUM: MAGNESIUM: 1.6 mg/dL — AB (ref 1.7–2.4)

## 2016-01-28 LAB — HEPARIN LEVEL (UNFRACTIONATED): HEPARIN UNFRACTIONATED: 0.67 [IU]/mL (ref 0.30–0.70)

## 2016-01-28 MED ORDER — SODIUM CHLORIDE 0.9 % IV SOLN: INTRAVENOUS | Status: DC | PRN

## 2016-01-28 MED ORDER — POTASSIUM CHLORIDE 10 MEQ/100ML IV SOLN
10.0000 meq | INTRAVENOUS | Status: AC
  Administered 2016-01-28 (×3): 10 meq via INTRAVENOUS
  Filled 2016-01-28 (×3): qty 100

## 2016-01-28 MED ORDER — SODIUM CHLORIDE 0.9 % IV BOLUS (SEPSIS)
500.0000 mL | Freq: Once | INTRAVENOUS | Status: AC
  Administered 2016-01-28: 500 mL via INTRAVENOUS

## 2016-01-28 MED ORDER — DEXTROSE 50 % IV SOLN
INTRAVENOUS | Status: AC
  Filled 2016-01-28: qty 50

## 2016-01-28 MED ORDER — SODIUM CHLORIDE 0.9 % IV SOLN
1.0000 g | Freq: Once | INTRAVENOUS | Status: AC
  Administered 2016-01-28: 1 g via INTRAVENOUS
  Filled 2016-01-28: qty 10

## 2016-01-28 MED ORDER — POTASSIUM CHLORIDE CRYS ER 20 MEQ PO TBCR
40.0000 meq | EXTENDED_RELEASE_TABLET | ORAL | Status: AC
  Administered 2016-01-28 (×2): 40 meq via ORAL
  Filled 2016-01-28 (×2): qty 2

## 2016-01-28 MED ORDER — DEXTROSE 50 % IV SOLN
50.0000 mL | Freq: Once | INTRAVENOUS | Status: AC
  Administered 2016-01-28: 50 mL via INTRAVENOUS

## 2016-01-28 MED ORDER — WARFARIN SODIUM 2.5 MG PO TABS
2.5000 mg | ORAL_TABLET | Freq: Once | ORAL | Status: AC
  Administered 2016-01-28: 2.5 mg via ORAL
  Filled 2016-01-28: qty 1

## 2016-01-28 NOTE — Progress Notes (Signed)
Paged and notified on call Mid-level provider with [facility] of patient's low BP's. BP's were taken in the BLE and right lower leg to compare. Pt.received a total of three 500 mL normal saline boluses during the shift. Order written for arterial line. Nurse anesthetist was called and notified. A total of three attempts were made but was unsuccessful. Patient has remained A/Ox4 and interactive throughout the shift. No signs of acute distress. Was told it is okay to continue blood pressure measurements on right leg. Will continue to monitor.

## 2016-01-28 NOTE — Progress Notes (Signed)
Hypoglycemic Event  CBG: 57  Treatment: 15 GM carbohydrate snack  Symptoms: None  Follow-up CBG: Time:2324 CBG Result:134  Possible Reasons for Event: Unknown  Comments/MD notified: Willene Hatchet

## 2016-01-28 NOTE — Progress Notes (Signed)
At 0812 pt CBG was 19.  Pt was A&Ox4 and said she felt "normal".  Immediatly Recked CBG with another glucometer and it was 21.  Admin 25g of IV D50 and gave oral 4oz of OJ.  Pt CBG at 0848 was 114.  Informed Dr. Gwenlyn Perking.  Erick Blinks, RN

## 2016-01-28 NOTE — Progress Notes (Signed)
[facility] PROGRESS NOTE  Madison [name] HWE:[id number] DOB: 07-17-1961 DOA: 01/27/2016 PCP: [name], NP  Assessment/Plan: 1-DKA, without coma -gap closed, CO2 aboe 19 and CBG's < 200 -patient now on 70/30 and SSI -will continue modified carb diet -follow CBG's and adjust insulin as needed   2-uncontrolled diabetes with polyneuropathy, retinopathy and CVA's -multiple admission secondary to DKA -actively follow by Dr. 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 -INR subtherapeutic on admission -on heparin for bridging -pharmacy managing  4-chronic combined heart failure  -last echo with EF 45-50% and grade 2 diastolic dysfunction  -currently compensated -will follow volume  -heart healthy diet   5-HLD -continue statins   6-hx of CAD -no CP -continue aspirin and statins   7-depression  -no hallucinations or SI -continue lexapro  8-hypokalemia: -K2.8 -will replete and follow electrolytes trend  9-GERD: will continue PPI  10-low BP: most likely miss-reading from BP cuff -AAOX4 and with MAP of 65-70 -per patient BP always run low -will hold lisinopril for now   11-hx of CVA: with mild left residual deficit  -PT consulted -continue coumadin for prevention   Code Status: Full Family Communication: no family at bedside  Disposition Plan: remains inpatient; will transfer to telemetry bed; continue adjusting electrolytes, follow CBG's/adjust insulin further base on fluctuation. Follow INR and transition off heparin drip.   Consultants:  None   Procedures:  See below for x-ray reports    Antibiotics:  None   HPI/Subjective: Afebrile, no CP, no SOB and in no acute distress. BP soft/low overnight; but MAP 65-70 and patient with normal mentation and otherwise stable.  Objective: Filed Vitals:   01/28/16 0451 01/28/16 0600   BP:  104/43  Pulse:    Temp: 98.3 F (36.8 C)   Resp:  17    Intake/Output Summary (Last 24 hours) at 01/28/16 0815 Last data filed at 01/27/16 2242  Gross per 24 hour  Intake 1080.51 ml  Output    200 ml  Net 880.51 ml   Filed Weights   01/27/16 0900  Weight: 68.1 kg (150 lb 2.1 oz)    Exam:   General:  Afebrile, denies CP and SOB. Flat affect on exam. Patient oriented X4 and in no distress. BP has remained soft (which is something she said she always has), but patient denies dizziness or lightheadedness sensation. MAP 65-70  Cardiovascular: S1-S2, positive SEM, no rubs or gallops; positive metal click appreciated on auscultation   Respiratory: CTA bilaterally  Abdomen: soft, NT, ND, positive BS  Musculoskeletal: no edema or cyanosis   Data Reviewed: Basic Metabolic Panel:  Recent Labs Lab 01/27/16 0231 01/27/16 0246 01/27/16 0759 01/27/16 1512 01/27/16 2020 01/28/16 0340  NA 126* 135 141 139 140 137  K 5.7* 4.5 4.3 3.5 3.6 2.8*  CL 91* 105 111 113* 114* 114*  CO2 7*  --  10* 19* 17* 18*  GLUCOSE 914* >700* 501* 142* 122* 56*  BUN 24* 22* 21* 18 16 12   CREATININE 1.54* 0.70 1.40* 0.93 0.98 0.74  CALCIUM 8.4*  --  8.3* 7.8* 7.8* 7.4*   Liver Function Tests:  Recent Labs Lab 01/21/16 2257 01/27/16 0231  AST 17 32  ALT 16 28  ALKPHOS 91 98  BILITOT 1.7* 2.0*  PROT 6.9 6.8  ALBUMIN 3.6 3.6    Recent Labs Lab 01/21/16 2257  LIPASE 15   CBC:  Recent  Labs Lab 01/21/16 2257 01/23/16 0612 01/27/16 0246 01/27/16 0258 01/27/16 0757 01/28/16 0340  WBC 9.8 6.6  --  14.0* 19.6* 16.5*  NEUTROABS 7.4  --   --  12.9*  --   --   HGB 9.9* 9.1* 9.5* 8.1* 8.1* 8.3*  HCT 31.2* 28.9* 28.0* 27.2* 26.9* 26.6*  MCV 79.8 79.8  --  84.5 83.0 80.1  PLT 308 235  --  211 259 241   BNP (last 3 results)  Recent Labs  07/30/15 1400  BNP 398.4*   CBG:  Recent Labs Lab 01/27/16 1548 01/27/16 1655 01/27/16 1950 01/27/16 2244 01/27/16 2340  GLUCAP  102* 84 100* 55* 144*    Recent Results (from the past 240 hour(s))  MRSA PCR Screening     Status: None   Collection Time: 01/22/16  1:26 AM  Result Value Ref Range Status   MRSA by PCR NEGATIVE NEGATIVE Final    Comment:        The GeneXpert MRSA Assay (FDA approved for NASAL specimens only), is one component of a comprehensive MRSA colonization surveillance program. It is not intended to diagnose MRSA infection nor to guide or monitor treatment for MRSA infections.   MRSA PCR Screening     Status: None   Collection Time: 01/27/16  8:54 AM  Result Value Ref Range Status   MRSA by PCR NEGATIVE NEGATIVE Final    Comment:        The GeneXpert MRSA Assay (FDA approved for NASAL specimens only), is one component of a comprehensive MRSA colonization surveillance program. It is not intended to diagnose MRSA infection nor to guide or monitor treatment for MRSA infections.      Studies: Dg Chest Portable 1 View  01/27/2016  CLINICAL DATA:  Diabetic ketoacidosis.  Confusion and tachycardia. EXAM: PORTABLE CHEST 1 VIEW COMPARISON:  11/23/2015 FINDINGS: Patient is post median sternotomy. There is a prosthetic aortic valve. Heart is normal in size. No pulmonary edema, consolidation, pleural effusion or pneumothorax. IMPRESSION: No acute process.  Post median sternotomy. Electronically Signed   By: Rubye Oaks M.D.   On: 01/27/2016 03:25    Scheduled Meds: . aspirin EC  81 mg Oral Daily  . calcium gluconate  1 g Intravenous Once  . darifenacin  7.5 mg Oral Daily  . escitalopram  20 mg Oral Daily  . insulin aspart  0-9 Units Subcutaneous TID WC  . insulin aspart protamine- aspart  25 Units Subcutaneous BID WC  . pantoprazole  80 mg Oral Daily  . potassium chloride  10 mEq Intravenous Q1 Hr x 3  . potassium chloride  40 mEq Oral Q4H  . pravastatin  20 mg Oral Daily  . warfarin  2.5 mg Oral ONCE-1800  . Warfarin - Pharmacist Dosing Inpatient   Does not apply q1800    Continuous Infusions: . sodium chloride    . heparin 950 Units/hr (01/27/16 0929)    Principal Problem:   DKA (diabetic ketoacidoses) (HCC) Active Problems:   DKA, type 1 (HCC)   CHF (congestive heart failure) (HCC)    Time spent: 35 minutes    Vassie Loll  [facility] Pager [phone]. If 7PM-7AM, please contact night-coverage at [email or web address], password [facility] 01/28/2016, 8:15 AM  LOS: 1 day

## 2016-01-28 NOTE — Progress Notes (Signed)
ANTICOAGULATION CONSULT NOTE - Follow up  Pharmacy Consult for IV heparin/warfarin Indication: Mechanical AVR w/ low INR  Allergies  Allergen Reactions  . Other Other (See Comments)    Can burn skin if left on too long.TAPE  . Celebrex [Celecoxib] Rash  . Detrol [Tolterodine] Hives    Patient Measurements: Height: 5\' 3"  (160 cm) Weight: 150 lb 2.1 oz (68.1 kg) IBW/kg (Calculated) : 52.4  Heparin Dosing Weight: 66kg  Vital Signs: Temp: 98.3 F (36.8 C) (03/11 0451) Temp Source: Oral (03/11 0451) BP: 104/43 mmHg (03/11 0600) Pulse Rate: 88 (03/11 0011)  Labs:  Recent Labs  01/26/16  01/27/16 0258 01/27/16 0757 01/27/16 0759 01/27/16 1512 01/27/16 1514 01/27/16 2020 01/28/16 0340  HGB  --   < > 8.1* 8.1*  --   --   --   --  8.3*  HCT  --   < > 27.2* 26.9*  --   --   --   --  26.6*  PLT  --   --  211 259  --   --   --   --  241  APTT  --   --   --   --  <20*  --   --   --   --   LABPROT  --   --   --   --  17.9*  --   --   --  27.2*  INR 1.5  --   --   --  1.53*  --   --   --  2.67*  HEPARINUNFRC  --   --   --   --   --   --  0.54 0.62 0.67  CREATININE  --   < >  --   --  1.40* 0.93  --  0.98 0.74  < > = values in this interval not displayed.  Estimated Creatinine Clearance: 73.6 mL/min (by C-G formula based on Cr of 0.74).  Assessment: 55yo F admitted with confusion, tachycardia, elevated CBGs and weakness on chronic warfarin for AVR. Patient is known to pharmacy from recent admission 3/4-3/7 on Lovenox and warfarin.  Pharmacy is asked to dose warfarin and heparin as bridge therapy.  Home warfarin dose = 7.5 mg daily; however patient was recently seen by the anti-coag clinic on 3/9 and was instructed to take 11.25 mg on 3/9 for subtherapeutic INR (after missing a dose on 3/8) and this was her most recent dose PTA.  Today, 01/28/2016: - INR 2.67, significant increase from yesterday's level of 1.53. Patient may be sensitive to warfarin given recent history of N/V so  higher dose taken on 3/9 had a significant effect. Pt also hasn't had any meal intake recorded.  No drug interactions noted. - Heparin level 0.67, therapeutic - Hgb/Plts stable. No bleeding/complications reported.  - SCr improved - Pt also on aspirin 81 mg daily  Goal of Therapy:  INR = 2.5-3.5 HL = 0.3-0.7 units/ml Monitor platelets by anticoagulation protocol: Yes   Plan:  Now that INR>2.5 and increasing quickly, will discontinue the heparin infusion. Warfarin 2.5 mg only today given quick increase in INR which will likely further increase tomorrow due to 10 mg dose given yesterday. F/u INR in AM.  5/9, PharmD, BCPS Pager: [phone] 01/28/2016 8:09 AM

## 2016-01-29 DIAGNOSIS — K219 Gastro-esophageal reflux disease without esophagitis: Secondary | ICD-10-CM | POA: Insufficient documentation

## 2016-01-29 DIAGNOSIS — Z952 Presence of prosthetic heart valve: Secondary | ICD-10-CM | POA: Insufficient documentation

## 2016-01-29 DIAGNOSIS — I5022 Chronic systolic (congestive) heart failure: Secondary | ICD-10-CM

## 2016-01-29 DIAGNOSIS — I5032 Chronic diastolic (congestive) heart failure: Secondary | ICD-10-CM | POA: Insufficient documentation

## 2016-01-29 LAB — BASIC METABOLIC PANEL
ANION GAP: 9 (ref 5–15)
BUN: 5 mg/dL — ABNORMAL LOW (ref 6–20)
CALCIUM: 8.1 mg/dL — AB (ref 8.9–10.3)
CHLORIDE: 112 mmol/L — AB (ref 101–111)
CO2: 17 mmol/L — AB (ref 22–32)
Creatinine, Ser: 0.71 mg/dL (ref 0.44–1.00)
GFR calc non Af Amer: >60 mL/min (ref >60)
Glucose, Bld: 225 mg/dL — ABNORMAL HIGH (ref 65–99)
Potassium: 4.8 mmol/L (ref 3.5–5.1)
SODIUM: 138 mmol/L (ref 135–145)

## 2016-01-29 LAB — PROTIME-INR
INR: 2.75 — ABNORMAL HIGH (ref 0.00–1.49)
INR: 2.7 — AB (ref 0.00–1.49)
PROTHROMBIN TIME: 28.7 s — AB (ref 11.6–15.2)
PROTHROMBIN TIME: 28.2 s — AB (ref 11.6–15.2)

## 2016-01-29 LAB — GLUCOSE, CAPILLARY
GLUCOSE-CAPILLARY: 31 mg/dL — AB (ref 65–99)
GLUCOSE-CAPILLARY: 91 mg/dL (ref 65–99)
Glucose-Capillary: 20 mg/dL — CL (ref 65–99)
Glucose-Capillary: 154 mg/dL — ABNORMAL HIGH (ref 65–99)
Glucose-Capillary: 41 mg/dL — CL (ref 65–99)
Glucose-Capillary: 93 mg/dL (ref 65–99)
Glucose-Capillary: 61 mg/dL — ABNORMAL LOW (ref 65–99)
Glucose-Capillary: 81 mg/dL (ref 65–99)

## 2016-01-29 LAB — MAGNESIUM: Magnesium: 1.6 mg/dL — ABNORMAL LOW (ref 1.7–2.4)

## 2016-01-29 LAB — CORTISOL: CORTISOL PLASMA: 8.9 ug/dL

## 2016-01-29 MED ORDER — INSULIN ASPART PROT & ASPART (70-30 MIX) 100 UNIT/ML ~~LOC~~ SUSP
12.0000 [IU] | Freq: Every day | SUBCUTANEOUS | Status: DC
  Administered 2016-01-30: 12 [IU] via SUBCUTANEOUS
  Filled 2016-01-29: qty 10

## 2016-01-29 MED ORDER — INSULIN ASPART PROT & ASPART (70-30 MIX) 100 UNIT/ML ~~LOC~~ SUSP
15.0000 [IU] | Freq: Two times a day (BID) | SUBCUTANEOUS | Status: DC
  Administered 2016-01-29: 15 [IU] via SUBCUTANEOUS
  Filled 2016-01-29: qty 10

## 2016-01-29 MED ORDER — MAGNESIUM SULFATE 2 GM/50ML IV SOLN
2.0000 g | Freq: Once | INTRAVENOUS | Status: AC
  Administered 2016-01-29: 2 g via INTRAVENOUS
  Filled 2016-01-29: qty 50

## 2016-01-29 MED ORDER — INSULIN ASPART PROT & ASPART (70-30 MIX) 100 UNIT/ML ~~LOC~~ SUSP
9.0000 [IU] | Freq: Every day | SUBCUTANEOUS | Status: DC
  Administered 2016-01-29 – 2016-01-30 (×2): 9 [IU] via SUBCUTANEOUS
  Filled 2016-01-29: qty 10

## 2016-01-29 MED ORDER — WARFARIN SODIUM 5 MG PO TABS
7.5000 mg | ORAL_TABLET | Freq: Once | ORAL | Status: AC
  Administered 2016-01-29: 7.5 mg via ORAL
  Filled 2016-01-29: qty 1

## 2016-01-29 MED ORDER — DEXTROSE 50 % IV SOLN
25.0000 mL | Freq: Once | INTRAVENOUS | Status: AC
  Administered 2016-01-29: 25 mL via INTRAVENOUS

## 2016-01-29 NOTE — Progress Notes (Signed)
Hypoglycemic Event  CBG: 61  Treatment: OJ  Symptoms: n/a  Follow-up CBG: Time:1726 CBG Result:91  Possible Reasons for Event:   Comments/MD notified:[place] [phone]    Lenox Ponds

## 2016-01-29 NOTE — Progress Notes (Signed)
[facility] PROGRESS NOTE  Madison [name] DOB: 01-Jan-1961 DOA: 01/27/2016 PCP: [name], NP  Assessment/Plan: 1-DKA, without coma -DKA resolved -patient now on 70/30 and SSI -will continue modified carb diet -follow CBG's and adjust insulin as needed -Anion Gap 9   2-uncontrolled diabetes with polyneuropathy, retinopathy and CVA's -multiple admission secondary to DKA -actively follow by Dr. 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 -INR subtherapeutic on admission -pharmacy managing  4-chronic combined heart failure  -last echo with EF 45-50% and grade 2 diastolic dysfunction  -currently compensated -will follow volume status -heart healthy diet   5-HLD -continue statins   6-hx of CAD -no CP -continue aspirin and statins   7-depression  -no hallucinations or SI -continue lexapro  8-hypokalemia: -K repleted and WNL now -will monitor   9-GERD: will continue PPI  10-low BP: most likely miss-reading from BP cuff -AAOX4 and with stabilization of BP now -will monitor -if remains ok, will resume lisinopril in am  11-hx of CVA: with mild left residual deficit  -PT consulted, stable to be discharge home, no PT needs -continue coumadin for prevention   Code Status: Full Family Communication: no family at bedside  Disposition Plan: remains inpatient; will transfer to telemetry bed; continue adjusting electrolytes, follow CBG's/adjust insulin further base on fluctuation. Follow INR and transition off heparin drip.   Consultants:  None   Procedures:  See below for x-ray reports    Antibiotics:  None   HPI/Subjective: Afebrile, no CP, no SOB and in no acute distress. BP stable now. CBG's all over the place, multiple hypoglycemic events. Oriented X  4  Objective: Filed Vitals:   01/29/16 0538 01/29/16 1319  BP: 120/52 119/46  Pulse: 82 79  Temp: 98 F (36.7 C) 98.3 F (36.8 C)  Resp: 16 18    Intake/Output Summary (Last 24 hours) at 01/29/16 1455 Last data filed at 01/29/16 0649  Gross per 24 hour  Intake 2341.67 ml  Output   1500 ml  Net 841.67 ml   Filed Weights   01/27/16 0900 01/29/16 0800  Weight: 68.1 kg (150 lb 2.1 oz) 72.9 kg (160 lb 11.5 oz)    Exam:   General:  Afebrile, denies CP and SOB. No acute distress. Patient oriented X4. Blood sugar very brittle and all over the place.   Cardiovascular: S1-S2, positive SEM, no rubs or gallops; positive metal click appreciated on auscultation   Respiratory: CTA bilaterally  Abdomen: soft, NT, ND, positive BS  Musculoskeletal: no edema or cyanosis   Data Reviewed: Basic Metabolic Panel:  Recent Labs Lab 01/27/16 0759 01/27/16 1512 01/27/16 2020 01/28/16 0339 01/28/16 0340 01/29/16 0856  NA 141 139 140  --  137 138  K 4.3 3.5 3.6  --  2.8* 4.8  CL 111 113* 114*  --  114* 112*  CO2 10* 19* 17*  --  18* 17*  GLUCOSE 501* 142* 122*  --  56* 225*  BUN 21* 18 16  --  12 5*  CREATININE 1.40* 0.93 0.98  --  0.74 0.71  CALCIUM 8.3* 7.8* 7.8*  --  7.4* 8.1*  MG  --   --   --  1.6*  --  1.6*   Liver Function Tests:  Recent Labs Lab 01/27/16 0231  AST 32  ALT 28  ALKPHOS 98  BILITOT 2.0*  PROT 6.8  ALBUMIN 3.6   CBC:  Recent Labs Lab 01/23/16 0612 01/27/16 0246 01/27/16 0258 01/27/16 0757 01/28/16 0340  WBC 6.6  --  14.0* 19.6* 16.5*  NEUTROABS  --   --  12.9*  --   --   HGB 9.1* 9.5* 8.1* 8.1* 8.3*  HCT 28.9* 28.0* 27.2* 26.9* 26.6*  MCV 79.8  --  84.5 83.0 80.1  PLT 235  --  211 259 241   BNP (last 3 results)  Recent Labs  07/30/15 1400  BNP 398.4*   CBG:  Recent Labs Lab 01/28/16 2324 01/29/16 0714 01/29/16 0737 01/29/16 0810 01/29/16 1150  GLUCAP 134* 31* 41* 154* 93    Recent Results (from the past 240 hour(s))   MRSA PCR Screening     Status: None   Collection Time: 01/22/16  1:26 AM  Result Value Ref Range Status   MRSA by PCR NEGATIVE NEGATIVE Final    Comment:        The GeneXpert MRSA Assay (FDA approved for NASAL specimens only), is one component of a comprehensive MRSA colonization surveillance program. It is not intended to diagnose MRSA infection nor to guide or monitor treatment for MRSA infections.   MRSA PCR Screening     Status: None   Collection Time: 01/27/16  8:54 AM  Result Value Ref Range Status   MRSA by PCR NEGATIVE NEGATIVE Final    Comment:        The GeneXpert MRSA Assay (FDA approved for NASAL specimens only), is one component of a comprehensive MRSA colonization surveillance program. It is not intended to diagnose MRSA infection nor to guide or monitor treatment for MRSA infections.      Studies: No results found.  Scheduled Meds: . aspirin EC  81 mg Oral Daily  . darifenacin  7.5 mg Oral Daily  . escitalopram  20 mg Oral Daily  . insulin aspart  0-9 Units Subcutaneous TID WC  . insulin aspart protamine- aspart  15 Units Subcutaneous BID WC  . pantoprazole  80 mg Oral Daily  . pravastatin  20 mg Oral Daily  . warfarin  7.5 mg Oral ONCE-1800  . Warfarin - Pharmacist Dosing Inpatient   Does not apply q1800   Continuous Infusions: . sodium chloride 100 mL/hr at 01/28/16 1329    Principal Problem:   DKA (diabetic ketoacidoses) (HCC) Active Problems:   DKA, type 1 (HCC)   CHF (congestive heart failure) (HCC)    Time spent: 35 minutes    Vassie Loll  [facility] Pager [phone]. If 7PM-7AM, please contact night-coverage at [email or web address], password [facility] 01/29/2016, 2:55 PM  LOS: 2 days

## 2016-01-29 NOTE — Progress Notes (Signed)
ANTICOAGULATION CONSULT NOTE - Follow up  Pharmacy Consult for IV heparin/warfarin Indication: Mechanical AVR w/ low INR  Allergies  Allergen Reactions  . Other Other (See Comments)    Can burn skin if left on too long.TAPE  . Celebrex [Celecoxib] Rash  . Detrol [Tolterodine] Hives    Patient Measurements: Height: 5\' 3"  (160 cm) Weight: 160 lb 11.5 oz (72.9 kg) IBW/kg (Calculated) : 52.4  Heparin Dosing Weight: 66kg  Vital Signs: Temp: 98 F (36.7 C) (03/12 0538) Temp Source: Oral (03/12 0538) BP: 120/52 mmHg (03/12 0538) Pulse Rate: 82 (03/12 0538)  Labs:  Recent Labs  01/27/16 0258 01/27/16 0757 01/27/16 0759  01/27/16 1514 01/27/16 2020 01/28/16 0340 01/29/16 0547 01/29/16 0856  HGB 8.1* 8.1*  --   --   --   --  8.3*  --   --   HCT 27.2* 26.9*  --   --   --   --  26.6*  --   --   PLT 211 259  --   --   --   --  241  --   --   APTT  --   --  <20*  --   --   --   --   --   --   LABPROT  --   --  17.9*  --   --   --  27.2* 28.7*  --   INR  --   --  1.53*  --   --   --  2.67* 2.75*  --   HEPARINUNFRC  --   --   --   --  0.54 0.62 0.67  --   --   CREATININE  --   --  1.40*  < >  --  0.98 0.74  --  0.71  < > = values in this interval not displayed.  Estimated Creatinine Clearance: 76 mL/min (by C-G formula based on Cr of 0.71).  Assessment: 55yo F admitted with confusion, tachycardia, elevated CBGs and weakness on chronic warfarin for AVR. Patient is known to pharmacy from recent admission 3/4-3/7 on Lovenox and warfarin.  Pharmacy is asked to dose warfarin and heparin as bridge therapy while INR subtherapeutic. On 3/11, INR>2.5 so heparin infusion was discontinued.  Home warfarin dose = 7.5 mg daily; however patient was recently seen by the anti-coag clinic on 3/9 and was instructed to take 11.25 mg on 3/9 for subtherapeutic INR (after missing a dose on 3/8) and this was her most recent dose PTA.  Today, 01/29/2016: - INR 2.75. Provided only 2.5 mg dose yesterday  due to significant increase in INR.  Pt had received a 11.25 mg dose on 3/9 and 10 mg dose on 3/10. - Hgb/Plts stable 3/11. No bleeding/complications reported.  - SCr improved - Pt also on aspirin 81 mg daily  Goal of Therapy:  INR = 2.5-3.5   Plan:  Warfarin 7.5 mg today. F/u INR in AM.  5/11, PharmD, BCPS Pager: [phone] 01/29/2016 10:49 AM

## 2016-01-29 NOTE — Progress Notes (Signed)
Hypoglycemic Event  CBG: 31  Treatment: OJ   Symptoms: n/a  Follow-up CBG: Time: 0745 CBG Result: 41\  Treatment: D50 IV  Follow-up CBG: Time: 0812  CBG Result: 154  Possible Reasons for Event: insulin dose? Pt had snack prior to bedtime and had eaten her meals.   Comments/MD notified: [place] [phone]    Lenox Ponds

## 2016-01-29 NOTE — Evaluation (Signed)
Physical Therapy Evaluation Patient Details Name: Madison [name] MRN: [id number] DOB: 07-08-1961 Today's Date: 01/29/2016   History of Present Illness  55 yo female admitted with DKA. Hx of CVA with residual L sided weakness, HTN, CAD, obesity, CHF, aortic stenosis, MI, RA, DM, polyneuropathy, L ankle fx.   Clinical Impression  On eval, pt was Min guard assist for mobility-walked ~150 feet. Pt tolerated distance well. No LOB. No c/o dizziness. Do not anticipate and follow up PT needs at discharge. Recommend daily ambulation with nursing during remainder of hospital stay.     Follow Up Recommendations No PT follow up    Equipment Recommendations  None recommended by PT    Recommendations for Other Services       Precautions / Restrictions Precautions Precautions: Fall Restrictions Weight Bearing Restrictions: No      Mobility  Bed Mobility Overal bed mobility: Modified Independent             General bed mobility comments: HOB elevated  Transfers Overall transfer level: Needs assistance   Transfers: Sit to/from Stand Sit to Stand: Supervision         General transfer comment: for safety  Ambulation/Gait Ambulation/Gait assistance: Min guard Ambulation Distance (Feet): 150 Feet Assistive device: None Gait Pattern/deviations: Step-through pattern;Decreased dorsiflexion - left     General Gait Details: slow gait speed. No LOB. No c/o dizziness/lightheadedness. Pt tolerated activity well.   Stairs            Wheelchair Mobility    Modified Rankin (Stroke Patients Only)       Balance                                             Pertinent Vitals/Pain Pain Assessment: No/denies pain    Home Living Family/patient expects to be discharged to:: Private residence Living Arrangements: Children (roomate) Available Help at Discharge:  (aide 2 hours, 6 days a week) Type of Home: House Home Access: Stairs to enter Entrance  Stairs-Rails: Right Entrance Stairs-Number of Steps: 3 Home Layout: One level Home Equipment: Grab bars - tub/shower;Walker - 2 wheels;Cane - single point;Grab bars - toilet;Bedside commode      Prior Function Level of Independence: Needs assistance   Gait / Transfers Assistance Needed: ambulatory without use of device inside home  ADL's / Homemaking Assistance Needed: aide assist with housework        Hand Dominance        Extremity/Trunk Assessment   Upper Extremity Assessment: LUE deficits/detail       LUE Deficits / Details: residual weakness from previous CVA   Lower Extremity Assessment: LLE deficits/detail   LLE Deficits / Details: residual weakness from previous CVA  Cervical / Trunk Assessment: Normal  Communication   Communication: No difficulties  Cognition Arousal/Alertness: Awake/alert Behavior During Therapy: WFL for tasks assessed/performed Overall Cognitive Status: Within Functional Limits for tasks assessed                      General Comments      Exercises        Assessment/Plan    PT Assessment Patient needs continued PT services  PT Diagnosis Difficulty walking   PT Problem List Decreased mobility  PT Treatment Interventions Gait training;Functional mobility training;Therapeutic activities;Patient/family education;Therapeutic exercise   PT Goals (Current goals can be found in the Care Plan  section) Acute Rehab PT Goals Patient Stated Goal: home soon PT Goal Formulation: With patient Time For Goal Achievement: 02/12/16 Potential to Achieve Goals: Good    Frequency Min 2X/week   Barriers to discharge        Co-evaluation               End of Session Equipment Utilized During Treatment: Gait belt Activity Tolerance: Patient tolerated treatment well Patient left: in bed;with call bell/phone within reach;with bed alarm set           Time: 7106-2694 PT Time Calculation (min) (ACUTE ONLY): 16  min   Charges:   PT Evaluation $PT Eval Low Complexity: 1 Procedure     PT G Codes:        Rebeca Alert, MPT Pager: [phone]

## 2016-01-30 ENCOUNTER — Other Ambulatory Visit: Payer: Self-pay

## 2016-01-30 DIAGNOSIS — E162 Hypoglycemia, unspecified: Secondary | ICD-10-CM

## 2016-01-30 LAB — GLUCOSE, CAPILLARY
GLUCOSE-CAPILLARY: 19 mg/dL — AB (ref 65–99)
GLUCOSE-CAPILLARY: 317 mg/dL — AB (ref 65–99)
GLUCOSE-CAPILLARY: 244 mg/dL — AB (ref 65–99)
GLUCOSE-CAPILLARY: 153 mg/dL — AB (ref 65–99)
Glucose-Capillary: 188 mg/dL — ABNORMAL HIGH (ref 65–99)

## 2016-01-30 LAB — BASIC METABOLIC PANEL
ANION GAP: 5 (ref 5–15)
BUN: 10 mg/dL (ref 6–20)
CALCIUM: 8.5 mg/dL — AB (ref 8.9–10.3)
CO2: 26 mmol/L (ref 22–32)
CREATININE: 0.56 mg/dL (ref 0.44–1.00)
Chloride: 110 mmol/L (ref 101–111)
GLUCOSE: 176 mg/dL — AB (ref 65–99)
Potassium: 4.6 mmol/L (ref 3.5–5.1)
Sodium: 141 mmol/L (ref 135–145)

## 2016-01-30 LAB — HEMOGLOBIN A1C
Hgb A1c MFr Bld: 9.9 % — ABNORMAL HIGH (ref 4.8–5.6)
MEAN PLASMA GLUCOSE: 237 mg/dL

## 2016-01-30 LAB — PROTIME-INR
INR: 2.38 — AB (ref 0.00–1.49)
PROTHROMBIN TIME: 25.7 s — AB (ref 11.6–15.2)

## 2016-01-30 LAB — CBC
HCT: 28.7 % — ABNORMAL LOW (ref 36.0–46.0)
HEMOGLOBIN: 8.8 g/dL — AB (ref 12.0–15.0)
MCH: 25 pg — AB (ref 26.0–34.0)
MCHC: 30.7 g/dL (ref 30.0–36.0)
MCV: 81.5 fL (ref 78.0–100.0)
PLATELETS: 212 10*3/uL (ref 150–400)
RBC: 3.52 MIL/uL — ABNORMAL LOW (ref 3.87–5.11)
RDW: 18.8 % — ABNORMAL HIGH (ref 11.5–15.5)
WBC: 5.3 10*3/uL (ref 4.0–10.5)

## 2016-01-30 MED ORDER — WARFARIN SODIUM 5 MG PO TABS
10.0000 mg | ORAL_TABLET | Freq: Once | ORAL | Status: AC
  Administered 2016-01-30: 10 mg via ORAL
  Filled 2016-01-30: qty 2

## 2016-01-30 MED ORDER — INSULIN ASPART PROT & ASPART (70-30 MIX) 100 UNIT/ML PEN: PEN_INJECTOR | SUBCUTANEOUS | Status: DC

## 2016-01-30 NOTE — Progress Notes (Signed)
Spoke with pt concerning follow up appointment and changing MD on Medicaid Card.

## 2016-01-30 NOTE — Care Management Important Message (Signed)
Important Message  Patient Details  Name: KAYLYNN CHAMBLIN MRN: [id number] Date of Birth: 1961-10-29   Medicare Important Message Given:  Yes    Haskell Flirt 01/30/2016, 10:35 AMImportant Message  Patient Details  Name: KIARRAH RAUSCH MRN: [id number] Date of Birth: June 04, 1961   Medicare Important Message Given:  Yes    Haskell Flirt 01/30/2016, 10:34 AM

## 2016-01-30 NOTE — Progress Notes (Signed)
ANTICOAGULATION CONSULT NOTE - Follow up  Pharmacy Consult for IV heparin/warfarin Indication: Mechanical AVR w/ low INR  Allergies  Allergen Reactions  . Other Other (See Comments)    Can burn skin if left on too long.TAPE  . Celebrex [Celecoxib] Rash  . Detrol [Tolterodine] Hives    Patient Measurements: Height: 5\' 3"  (160 cm) Weight: 159 lb 2.8 oz (72.2 kg) IBW/kg (Calculated) : 52.4  Heparin Dosing Weight: 66kg  Vital Signs: Temp: 97.8 F (36.6 C) (03/13 0503) Temp Source: Oral (03/13 0503) BP: 128/60 mmHg (03/13 0503) Pulse Rate: 65 (03/13 0503)  Labs:  Recent Labs  01/27/16 1514 01/27/16 2020  01/28/16 0340 01/29/16 0547 01/29/16 0856 01/30/16 0427  HGB  --   --   --  8.3*  --   --  8.8*  HCT  --   --   --  26.6*  --   --  28.7*  PLT  --   --   --  241  --   --  212  LABPROT  --   --   < > 27.2* 28.7* 28.2* 25.7*  INR  --   --   < > 2.67* 2.75* 2.70* 2.38*  HEPARINUNFRC 0.54 0.62  --  0.67  --   --   --   CREATININE  --  0.98  --  0.74  --  0.71 0.56  < > = values in this interval not displayed.  Estimated Creatinine Clearance: 75.6 mL/min (by C-G formula based on Cr of 0.56).  Assessment: 55yo F admitted with confusion, tachycardia, elevated CBGs and weakness on chronic warfarin for AVR. Patient is known to pharmacy from recent admission 3/4-3/7 on Lovenox and warfarin.  Pharmacy is asked to dose warfarin and heparin as bridge therapy while INR subtherapeutic. On 3/11, INR>2.5 so heparin infusion was discontinued.  Home warfarin dose = 7.5 mg daily; however patient was recently seen by the anti-coag clinic on 3/9 and was instructed to take 11.25 mg on 3/9 for subtherapeutic INR (after missing a dose on 3/8) and this was her most recent dose PTA.  Today, 01/30/2016: - INR 2.38, just below goal range after doses of 11.25mg , 10mg , 2.5mg  and 7.5mg  last 4 days. - Hgb/Plts stable 3/11. No bleeding/complications reported.  - SCr stable - Pt also on aspirin 81 mg  daily  Goal of Therapy:  INR = 2.5-3.5   Plan:  Warfarin 10 mg today. F/u INR in AM.  , PharmD, BCPS Pager [phone] 01/30/2016 10:06 AM

## 2016-01-30 NOTE — Patient Outreach (Signed)
Unsuccessful attempt made to contact patient via telephone for community care coordination. Unable to leave patient HIPPA compliant message because patient's phone has not recording device access.    Plan: Make another attempt to contact patient via telephone.

## 2016-01-30 NOTE — Progress Notes (Signed)
Patient discharged home with family, discharge instructions and prescription  given and explained to patient and he verbalized understanding. No wound noted, skin intact. Patient denies any pain/distress, accompanied home by brother in-law.

## 2016-01-30 NOTE — Discharge Summary (Signed)
Physician Discharge Summary  Madison [name] CXK:[id number] DOB: [birth year] DOA: 01/27/2016  PCP: [name], NP  Admit date: 01/27/2016 Discharge date: 01/30/2016  Time spent: 40 minutes  Recommendations for Outpatient Follow-up:  1. Close follow up of patient CBG's and active further adjustments of her insulin therapy 2. Close follow up on her coumadin level 3. BMET to follow electrolytes and renal function    Discharge Diagnoses:  Principal Problem:   DKA (diabetic ketoacidoses) (HCC) Active Problems:   DKA, type 1 (HCC)   CHF (congestive heart failure) (HCC)   Chronic systolic heart failure (HCC)   Esophageal reflux   S/P AVR (aortic valve replacement) HLD Essential HTN  Discharge Condition: stable and improved. [facility] patient will follow up with endocrinologist in 3-4 days, PCP in 10 days and with coumadin clinic in no more than 2 days.  Diet recommendation: modified carbohydrates and low sodium diet   Filed Weights   01/27/16 0900 01/29/16 0800 01/30/16 0503  Weight: 68.1 kg (150 lb 2.1 oz) 72.9 kg (160 lb 11.5 oz) 72.2 kg (159 lb 2.8 oz)    History of present illness:  As per H&P written by Dr. Julian Reil on 01/27/16 55 y.o. female with h/o AVR with mechanical valve. DM1, poorly controlled, just discharged from our service a couple of days ago (3/4-3/7) for DKA. Patient presents to the ED with N/V, AMS. Found once again to be in DKA with anion gap of 28 this evening.  Hospital Course:  1-DKA, without coma -DKA resolved -CBG's in the 170, CO2 26 and A, Gap 5 -patient now on 70/30 and SSI -will continue modified carb diet -close follow up of CBG's and adjust insulin as needed  2-uncontrolled diabetes with polyneuropathy, retinopathy and CVA's -multiple admission secondary to DKA -actively follow by Dr. Romero Belling  -will continue SSI and 70/30; dose in continuous adjustment due to hypoglycemics events  -concerns for  compliance with diet/insulin regimen  -A1C 9.9 -will arrange [facility] -at discharge patient will use 14 units in am and 9 units at bedside   3-metallic valve replacement (AVR) -chronically on coumadin -INR subtherapeutic on admission -pharmacy helped managing -INR 2/4 at discharge; coumadin 10 mg will be given on 3/13 and then resumption of 7.5 mg with close follow up at coumadin clinic instructed  4-chronic combined heart failure:  -last echo with EF 45-50% and grade 2 diastolic dysfunction  -compensated during this admission  -advise to follow volume status -heart healthy diet   5-HLD: -continue statins   6-hx of CAD: -no CP or SOB -continue aspirin and statins   7-depression : -no hallucinations or SI -continue lexapro  8-hypokalemia: -K repleted and WNL at discharge  9-GERD: will continue PPI  10-HTN: with some low BP episodes, most likely miss-reading from BP cuff -AAOX4 and with stabilization of BP now -stable and rising  -will resume lisinopril at discharge   11-hx of CVA: with mild left residual deficit  -Physical therapy consulted, stable to be discharge home, no PT needs -continue coumadin for prevention   Procedures:  See below for x-ray reports   Consultations:  None   Discharge Exam: Filed Vitals:   01/30/16 0503 01/30/16 1350  BP: 128/60 125/47  Pulse: 65 77  Temp: 97.8 F (36.6 C) 98.7 F (37.1 C)  Resp: 18 18    General: Afebrile, denies CP and SOB. No acute distress. Patient oriented X4. Blood sugar very brittle; but finally stable and w/o hypoglycemia in  the last 24 hours.   Cardiovascular: S1-S2, positive SEM, no rubs or gallops; positive metal click appreciated on auscultation   Respiratory: CTA bilaterally  Abdomen: soft, NT, ND, positive BS  Musculoskeletal: no edema or cyanosis   Discharge Instructions   Discharge Instructions    Discharge instructions    Complete by:  As directed   Keep yourself well  hydrated Take medications as prescribed Follow modified carbohydrates diet Arrange follow up with Dr. Everardo All in 3-4 days Please follow up with PCP in 1 week and visit your coumadin clinic in the next 2 days for further adjustments on your coumadin level          Current Discharge Medication List    CONTINUE these medications which have CHANGED   Details  insulin aspart protamine - aspart (NOVOLOG MIX 70/30 FLEXPEN) (70-30) 100 UNIT/ML FlexPen Inject 14 units in am and 9 units at bedtime Qty: 15 mL, Refills: 0      CONTINUE these medications which have NOT CHANGED   Details  acetaminophen (TYLENOL) 325 MG tablet Take 2 tablets (650 mg total) by mouth every 6 (six) hours as needed for mild pain (temp > 101.5).    aspirin 81 MG tablet Take 1 tablet (81 mg total) by mouth daily. Qty: 30 tablet    escitalopram (LEXAPRO) 20 MG tablet Take 1 tablet (20 mg total) by mouth daily. Qty: 30 tablet, Refills: 6    lisinopril (PRINIVIL,ZESTRIL) 2.5 MG tablet Take 2.5 mg by mouth daily.     omeprazole (PRILOSEC) 40 MG capsule TAKE 1 CAPSULE (40 MG TOTAL) BY MOUTH DAILY. Qty: 90 capsule, Refills: 1    ondansetron (ZOFRAN) 4 MG tablet Take 1 tablet (4 mg total) by mouth every 8 (eight) hours as needed for nausea or vomiting. Qty: 20 tablet, Refills: 0    pravastatin (PRAVACHOL) 20 MG tablet Take 1 tablet (20 mg total) by mouth daily. Qty: 90 tablet, Refills: 0    solifenacin (VESICARE) 5 MG tablet Take 1 tablet (5 mg total) by mouth daily. Qty: 90 tablet, Refills: 2    warfarin (COUMADIN) 7.5 MG tablet Take 1 tablet daily or as directed by coumadin clinic. Qty: 140 tablet, Refills: 0       Allergies  Allergen Reactions  . Other Other (See Comments)    Can burn skin if left on too long.TAPE  . Celebrex [Celecoxib] Rash  . Detrol [Tolterodine] Hives   Follow-up Information    Follow up with [name], NP In 10 days.   Specialty:  Family Medicine   Contact information:   [address] [place] [address] [phone]       Follow up with Romero Belling, MD. Schedule an appointment as soon as possible for a visit in 4 days.   Specialty:  Endocrinology   Contact information:   [address] [place] [address] [phone]       The results of significant diagnostics from this hospitalization (including imaging, microbiology, ancillary and laboratory) are listed below for reference.    Significant Diagnostic Studies: Dg Chest Portable 1 View  01/27/2016  CLINICAL DATA:  Diabetic ketoacidosis.  Confusion and tachycardia. EXAM: PORTABLE CHEST 1 VIEW COMPARISON:  11/23/2015 FINDINGS: Patient is post median sternotomy. There is a prosthetic aortic valve. Heart is normal in size. No pulmonary edema, consolidation, pleural effusion or pneumothorax. IMPRESSION: No acute process.  Post median sternotomy. Electronically Signed   By: Rubye Oaks M.D.   On:  01/27/2016 03:25    Microbiology: Recent Results (from the past 240 hour(s))  MRSA PCR Screening     Status: None   Collection Time: 01/22/16  1:26 AM  Result Value Ref Range Status   MRSA by PCR NEGATIVE NEGATIVE Final    Comment:        The GeneXpert MRSA Assay (FDA approved for NASAL specimens only), is one component of a comprehensive MRSA colonization surveillance program. It is not intended to diagnose MRSA infection nor to guide or monitor treatment for MRSA infections.   MRSA PCR Screening     Status: None   Collection Time: 01/27/16  8:54 AM  Result Value Ref Range Status   MRSA by PCR NEGATIVE NEGATIVE Final    Comment:        The GeneXpert MRSA Assay (FDA approved for NASAL specimens only), is one component of a comprehensive MRSA colonization surveillance program. It is not intended to diagnose MRSA infection nor to guide or monitor treatment for MRSA infections.      Labs: Basic Metabolic Panel:  Recent Labs Lab 01/27/16 1512 01/27/16 2020  01/28/16 0339 01/28/16 0340 01/29/16 0856 01/30/16 0427  NA 139 140  --  137 138 141  K 3.5 3.6  --  2.8* 4.8 4.6  CL 113* 114*  --  114* 112* 110  CO2 19* 17*  --  18* 17* 26  GLUCOSE 142* 122*  --  56* 225* 176*  BUN 18 16  --  12 5* 10  CREATININE 0.93 0.98  --  0.74 0.71 0.56  CALCIUM 7.8* 7.8*  --  7.4* 8.1* 8.5*  MG  --   --  1.6*  --  1.6*  --    Liver Function Tests:  Recent Labs Lab 01/27/16 0231  AST 32  ALT 28  ALKPHOS 98  BILITOT 2.0*  PROT 6.8  ALBUMIN 3.6   CBC:  Recent Labs Lab 01/27/16 0246 01/27/16 0258 01/27/16 0757 01/28/16 0340 01/30/16 0427  WBC  --  14.0* 19.6* 16.5* 5.3  NEUTROABS  --  12.9*  --   --   --   HGB 9.5* 8.1* 8.1* 8.3* 8.8*  HCT 28.0* 27.2* 26.9* 26.6* 28.7*  MCV  --  84.5 83.0 80.1 81.5  PLT  --  211 259 241 212   BNP (last 3 results)  Recent Labs  07/30/15 1400  BNP 398.4*   CBG:  Recent Labs Lab 01/29/16 1726 01/29/16 2119 01/30/16 0501 01/30/16 0826 01/30/16 1158  GLUCAP 91 81 188* 317* 244*    Signed:  Vassie Loll MD.  [facility] 01/30/2016, 3:03 PM

## 2016-01-30 NOTE — Progress Notes (Signed)
Spoke with pt concerning Home Health. Pt was active with [facility] and will continue at discharge.

## 2016-01-31 ENCOUNTER — Telehealth: Payer: Self-pay | Admitting: General Practice

## 2016-01-31 ENCOUNTER — Other Ambulatory Visit: Payer: Self-pay

## 2016-01-31 DIAGNOSIS — E119 Type 2 diabetes mellitus without complications: Secondary | ICD-10-CM | POA: Diagnosis not present

## 2016-01-31 NOTE — Patient Outreach (Signed)
Unsuccessful attempt made to contact patient via telephone at # [phone]. Will make another attempt to contact patient by telephone on tomorrow, March 15.

## 2016-01-31 NOTE — Telephone Encounter (Signed)
Transition Care Management Follow-up Telephone Call   Date discharged? 01/30/2016   How have you been since you were released from the hospital?  I have been feeling good.   Do you understand why you were in the hospital?  Yes.   Do you understand the discharge instructions?  Yes.   Where were you discharged to?Home.   Items Reviewed:  Medications reviewed:  Yes.  Allergies reviewed: yes    Dietary changes reviewed: yes   Referrals reviewed: yes    Functional Questionnaire:  Activities of Daily Living (ADLs):  Patient states she is independent with all ADLS. Marland Kitchen Any transportation issues/concerns?:No Any patient concerns? No  Confirmed importance and date/time of follow-up visits scheduled Yes - Provider Appointment booked with [name] on 3/20 @ 10:15  Confirmed with patient if condition begins to worsen call PCP or go to the ER.  Patient was given the office number and encouraged to call back with question or concerns.  :Yes - Asked patient to call [place] office if any questions @ [phone]

## 2016-02-01 ENCOUNTER — Ambulatory Visit: Payer: Self-pay

## 2016-02-01 DIAGNOSIS — E119 Type 2 diabetes mellitus without complications: Secondary | ICD-10-CM | POA: Diagnosis not present

## 2016-02-02 ENCOUNTER — Telehealth: Payer: Self-pay | Admitting: Endocrinology

## 2016-02-02 DIAGNOSIS — I5042 Chronic combined systolic (congestive) and diastolic (congestive) heart failure: Secondary | ICD-10-CM | POA: Diagnosis not present

## 2016-02-02 DIAGNOSIS — E785 Hyperlipidemia, unspecified: Secondary | ICD-10-CM | POA: Diagnosis not present

## 2016-02-02 DIAGNOSIS — E109 Type 1 diabetes mellitus without complications: Secondary | ICD-10-CM | POA: Diagnosis not present

## 2016-02-02 DIAGNOSIS — I110 Hypertensive heart disease with heart failure: Secondary | ICD-10-CM | POA: Diagnosis not present

## 2016-02-02 DIAGNOSIS — M0600 Rheumatoid arthritis without rheumatoid factor, unspecified site: Secondary | ICD-10-CM | POA: Diagnosis not present

## 2016-02-02 DIAGNOSIS — E119 Type 2 diabetes mellitus without complications: Secondary | ICD-10-CM | POA: Diagnosis not present

## 2016-02-02 DIAGNOSIS — D649 Anemia, unspecified: Secondary | ICD-10-CM | POA: Diagnosis not present

## 2016-02-02 DIAGNOSIS — I2510 Atherosclerotic heart disease of native coronary artery without angina pectoris: Secondary | ICD-10-CM | POA: Diagnosis not present

## 2016-02-02 DIAGNOSIS — I350 Nonrheumatic aortic (valve) stenosis: Secondary | ICD-10-CM | POA: Diagnosis not present

## 2016-02-02 DIAGNOSIS — R1314 Dysphagia, pharyngoesophageal phase: Secondary | ICD-10-CM | POA: Diagnosis not present

## 2016-02-02 NOTE — Telephone Encounter (Signed)
The Physical Therapist, Barnetta Chapel called and said he wanted to notify us of some drug reactions that the PT has on her list.  Lexapro interacts with Zofran severely and Trazodone interacts with Zofran also.  He said if there were any questions call him at [phone]

## 2016-02-02 NOTE — Telephone Encounter (Signed)
Please refer this message to PCP, as I only see pt for DM

## 2016-02-02 NOTE — Telephone Encounter (Signed)
Read message below and be advised.  

## 2016-02-03 ENCOUNTER — Telehealth: Payer: Self-pay | Admitting: Adult Health

## 2016-02-03 DIAGNOSIS — I2510 Atherosclerotic heart disease of native coronary artery without angina pectoris: Secondary | ICD-10-CM | POA: Diagnosis not present

## 2016-02-03 DIAGNOSIS — E119 Type 2 diabetes mellitus without complications: Secondary | ICD-10-CM | POA: Diagnosis not present

## 2016-02-03 DIAGNOSIS — E109 Type 1 diabetes mellitus without complications: Secondary | ICD-10-CM | POA: Diagnosis not present

## 2016-02-03 DIAGNOSIS — I5042 Chronic combined systolic (congestive) and diastolic (congestive) heart failure: Secondary | ICD-10-CM | POA: Diagnosis not present

## 2016-02-03 DIAGNOSIS — R1314 Dysphagia, pharyngoesophageal phase: Secondary | ICD-10-CM | POA: Diagnosis not present

## 2016-02-03 DIAGNOSIS — M0600 Rheumatoid arthritis without rheumatoid factor, unspecified site: Secondary | ICD-10-CM | POA: Diagnosis not present

## 2016-02-03 DIAGNOSIS — D649 Anemia, unspecified: Secondary | ICD-10-CM | POA: Diagnosis not present

## 2016-02-03 DIAGNOSIS — I110 Hypertensive heart disease with heart failure: Secondary | ICD-10-CM | POA: Diagnosis not present

## 2016-02-03 DIAGNOSIS — E785 Hyperlipidemia, unspecified: Secondary | ICD-10-CM | POA: Diagnosis not present

## 2016-02-03 DIAGNOSIS — I350 Nonrheumatic aortic (valve) stenosis: Secondary | ICD-10-CM | POA: Diagnosis not present

## 2016-02-03 NOTE — Telephone Encounter (Signed)
I contacted Physical Therapist Loraine Leriche and advised of note below. He voiced understanding.

## 2016-02-03 NOTE — Telephone Encounter (Signed)
Ok per [place] for verbal orders.  Called and spoke with [facility] and she is aware.

## 2016-02-03 NOTE — Telephone Encounter (Signed)
Melissa w/ [facility] called to get verbal orders for pt to have  Home health and Skilled Nursing 1X /week for 8 weeks,   Then 1 X wk for 2 weeks prn.

## 2016-02-05 DIAGNOSIS — E119 Type 2 diabetes mellitus without complications: Secondary | ICD-10-CM | POA: Diagnosis not present

## 2016-02-06 ENCOUNTER — Other Ambulatory Visit: Payer: Self-pay

## 2016-02-06 ENCOUNTER — Ambulatory Visit (INDEPENDENT_AMBULATORY_CARE_PROVIDER_SITE_OTHER): Payer: Commercial Managed Care - HMO | Admitting: Adult Health

## 2016-02-06 ENCOUNTER — Ambulatory Visit (INDEPENDENT_AMBULATORY_CARE_PROVIDER_SITE_OTHER): Payer: Commercial Managed Care - HMO | Admitting: General Practice

## 2016-02-06 VITALS — BP 132/68 | Temp 98.3°F | Ht 63.0 in | Wt 143.8 lb

## 2016-02-06 DIAGNOSIS — R112 Nausea with vomiting, unspecified: Secondary | ICD-10-CM | POA: Diagnosis not present

## 2016-02-06 DIAGNOSIS — Z7901 Long term (current) use of anticoagulants: Secondary | ICD-10-CM

## 2016-02-06 DIAGNOSIS — Z952 Presence of prosthetic heart valve: Secondary | ICD-10-CM

## 2016-02-06 DIAGNOSIS — E1065 Type 1 diabetes mellitus with hyperglycemia: Secondary | ICD-10-CM

## 2016-02-06 DIAGNOSIS — Z09 Encounter for follow-up examination after completed treatment for conditions other than malignant neoplasm: Secondary | ICD-10-CM

## 2016-02-06 DIAGNOSIS — Z954 Presence of other heart-valve replacement: Secondary | ICD-10-CM | POA: Diagnosis not present

## 2016-02-06 DIAGNOSIS — E119 Type 2 diabetes mellitus without complications: Secondary | ICD-10-CM | POA: Diagnosis not present

## 2016-02-06 DIAGNOSIS — R1110 Vomiting, unspecified: Secondary | ICD-10-CM

## 2016-02-06 LAB — CBC WITH DIFFERENTIAL/PLATELET
Basophils Absolute: 0.1 10*3/uL (ref 0.0–0.1)
Basophils Relative: 0.5 % (ref 0.0–3.0)
EOS PCT: 0.2 % (ref 0.0–5.0)
Eosinophils Absolute: 0 10*3/uL (ref 0.0–0.7)
HCT: 32.5 % — ABNORMAL LOW (ref 36.0–46.0)
Hemoglobin: 10.4 g/dL — ABNORMAL LOW (ref 12.0–15.0)
LYMPHS ABS: 3.3 10*3/uL (ref 0.7–4.0)
Lymphocytes Relative: 25.9 % (ref 12.0–46.0)
MCHC: 32.2 g/dL (ref 30.0–36.0)
MCV: 77.8 fl — AB (ref 78.0–100.0)
MONO ABS: 0.6 10*3/uL (ref 0.1–1.0)
MONOS PCT: 4.9 % (ref 3.0–12.0)
NEUTROS ABS: 8.7 10*3/uL — AB (ref 1.4–7.7)
Neutrophils Relative %: 68.5 % (ref 43.0–77.0)
Platelets: 502 10*3/uL — ABNORMAL HIGH (ref 150.0–400.0)
RBC: 4.17 Mil/uL (ref 3.87–5.11)
RDW: 20 % — ABNORMAL HIGH (ref 11.5–15.5)
WBC: 12.7 10*3/uL — ABNORMAL HIGH (ref 4.0–10.5)

## 2016-02-06 LAB — BASIC METABOLIC PANEL
BUN: 17 mg/dL (ref 6–23)
CALCIUM: 9.8 mg/dL (ref 8.4–10.5)
CO2: 24 meq/L (ref 19–32)
CREATININE: 0.98 mg/dL (ref 0.40–1.20)
Chloride: 98 mEq/L (ref 96–112)
GFR: 62.6 mL/min (ref >60.00)
GLUCOSE: 64 mg/dL — AB (ref 70–99)
Potassium: 4 mEq/L (ref 3.5–5.1)
SODIUM: 135 meq/L (ref 135–145)

## 2016-02-06 LAB — GLUCOSE, POCT (MANUAL RESULT ENTRY)
POC GLUCOSE: 46 mg/dL — AB (ref 70–99)
POC Glucose: 47 mg/dl — AB (ref 70–99)
POC Glucose: 60 mg/dl — AB (ref 70–99)

## 2016-02-06 LAB — POCT INR: INR: 3.8

## 2016-02-06 MED ORDER — ONDANSETRON HCL 4 MG PO TABS: 4.0000 mg | ORAL_TABLET | Freq: Three times a day (TID) | ORAL | Status: DC | PRN

## 2016-02-06 NOTE — Addendum Note (Signed)
Addended by: Nancy Fetter on: 02/06/2016 12:34 PM   Modules accepted: Level of Service

## 2016-02-06 NOTE — Progress Notes (Signed)
I agree with this plan.

## 2016-02-06 NOTE — Progress Notes (Signed)
Pre visit review using our clinic review tool, if applicable. No additional management support is needed unless otherwise documented below in the visit note. 

## 2016-02-06 NOTE — Addendum Note (Signed)
Addended by: Janelle Floor B on: 02/06/2016 10:45 AM   Modules accepted: Orders

## 2016-02-06 NOTE — Progress Notes (Signed)
Subjective:    Patient ID: Madison [name], female    DOB: Apr 15, 1961, 55 y.o.   MRN: [id number]  HPI  55 year old female, who presents to the office today for hospital follow up. She was admitted on 01/27/2016 and discharged on 01/30/2016 for DKA. 6 days prior to this she had been discharged, again for DKA.   She has not seen endocrinology since being discharged  Today in the office she reports that she is nauseated and has been vomiting all weekend.She has not been eating due to the vomiting. She has not had any diarrhea or fevers.   Her blood sugar in the office today is 47.  She reports that since being out of the hospital her blood sugars have been "ok", they were up to 311 last night but have been in the 120-180 range.   Review of Systems  Constitutional: Negative.   Respiratory: Negative.   Cardiovascular: Negative.   Gastrointestinal: Positive for nausea and vomiting. Negative for abdominal pain, diarrhea, constipation, abdominal distention and anal bleeding.  Musculoskeletal: Negative.   Skin: Negative.   Neurological: Negative.   All other systems reviewed and are negative.  Past Medical History  Diagnosis Date  . CAD (coronary artery disease)   . Obesity   . Hypercholesteremia   . HTN (hypertension)   . Dyslipidemia   . Aortic stenosis   . Thrombophlebitis   . Heart murmur   . Myocardial infarction (HCC) 12/2014  . Pneumonia 11/2014; 12/2014  . GERD (gastroesophageal reflux disease)   . Stroke syndrome [facility]) 1995; 2001    "when my son was born; problems w/speech and L hand since then" (01/19/2015)  . Anxiety   . Depression   . Allergy   . CHF (congestive heart failure) (HCC)   . Rheumatoid arthritis(714.0)     "hands" (07/01/2015)  . Diabetes (HCC) dx'd 1982    "went straight to insulin"    Social History   Social History  . Marital Status: Divorced    Spouse Name: N/A  . Number of Children: N/A  . Years of Education: N/A   Occupational History  . Not on  file.   Social History Main Topics  . Smoking status: Never Smoker   . Smokeless tobacco: Never Used  . Alcohol Use: No  . Drug Use: No  . Sexual Activity: No   Other Topics Concern  . Not on file   Social History Narrative   Divroced and lives at home.    Has two sons who live in [place] at this time. (21&8 y/o).    Has a cat    Walks a lot.       She is having great success with her diet       Working with exercises that were given to her by PT/OT.     Past Surgical History  Procedure Laterality Date  . Cesarean section  1995  . Foot fracture surgery    . Knee arthroscopy Right   . Neuroplasty / transposition median nerve at carpal tunnel    . Left heart catheterization with coronary angiogram N/A 12/08/2014    Procedure: LEFT HEART CATHETERIZATION WITH CORONARY ANGIOGRAM;  Surgeon: Iran Ouch, MD;  Location: [facility] CATH LAB;  Service: Cardiovascular;  Laterality: N/A;  . Percutaneous coronary stent intervention (pci-s) N/A 12/09/2014    Procedure: PERCUTANEOUS CORONARY STENT INTERVENTION (PCI-S);  Surgeon: [name], MD;  Location: [facility] CATH LAB;  Service: Cardiovascular;  Laterality: N/A;  .  Cardiac catheterization  12/08/2014  . Coronary angioplasty with stent placement  12/09/2014  . Aortic valve replacement (avr)/coronary artery bypass grafting (cabg)  "2014"    [name] 03/08/2014  . Fracture surgery    . Tubal ligation  1995  . Cataract extraction Left   . Coronary artery bypass graft    . Orif ankle fracture Left 07/04/2015    Procedure: OPEN REDUCTION INTERNAL FIXATION (ORIF)  TRIMAL ANKLE FRACTURE;  Surgeon: [name], MD;  Location: [facility] OR;  Service: Orthopedics;  Laterality: Left;    Family History  Problem Relation Age of Onset  . Stroke    . Heart disease Mother   . Heart disease Sister     Allergies  Allergen Reactions  . Other Other (See Comments)    Can burn skin if left on too long.TAPE  . Celebrex [Celecoxib] Rash  . Detrol [Tolterodine]  Hives    Current Outpatient Prescriptions on File Prior to Visit  Medication Sig Dispense Refill  . acetaminophen (TYLENOL) 325 MG tablet Take 2 tablets (650 mg total) by mouth every 6 (six) hours as needed for mild pain (temp > 101.5).    Marland Kitchen aspirin 81 MG tablet Take 1 tablet (81 mg total) by mouth daily. 30 tablet   . escitalopram (LEXAPRO) 20 MG tablet Take 1 tablet (20 mg total) by mouth daily. 30 tablet 6  . insulin aspart protamine - aspart (NOVOLOG MIX 70/30 FLEXPEN) (70-30) 100 UNIT/ML FlexPen Inject 14 units in am and 9 units at bedtime 15 mL 0  . lisinopril (PRINIVIL,ZESTRIL) 2.5 MG tablet Take 2.5 mg by mouth daily.     Marland Kitchen omeprazole (PRILOSEC) 40 MG capsule TAKE 1 CAPSULE (40 MG TOTAL) BY MOUTH DAILY. 90 capsule 1  . ondansetron (ZOFRAN) 4 MG tablet Take 1 tablet (4 mg total) by mouth every 8 (eight) hours as needed for nausea or vomiting. 20 tablet 0  . pravastatin (PRAVACHOL) 20 MG tablet Take 1 tablet (20 mg total) by mouth daily. 90 tablet 0  . solifenacin (VESICARE) 5 MG tablet Take 1 tablet (5 mg total) by mouth daily. 90 tablet 2  . warfarin (COUMADIN) 7.5 MG tablet Take 1 tablet daily or as directed by coumadin clinic. 140 tablet 0   No current facility-administered medications on file prior to visit.    BP 132/68 mmHg  Temp(Src) 98.3 F (36.8 C) (Oral)  Ht 5\' 3"  (1.6 m)  Wt 143 lb 12.8 oz (65.227 kg)  BMI 25.48 kg/m2       Objective:   Physical Exam  Constitutional: She is oriented to person, place, and time. She appears well-developed and well-nourished. No distress.  Cardiovascular: Normal rate, regular rhythm, normal heart sounds and intact distal pulses.  Exam reveals no gallop and no friction rub.   No murmur heard. Pulmonary/Chest: Effort normal and breath sounds normal. No respiratory distress. She has no wheezes. She has no rales. She exhibits no tenderness.  Abdominal: Soft. Bowel sounds are normal. She exhibits no distension and no mass. There is no  tenderness. There is no rebound and no guarding.  Musculoskeletal: Normal range of motion. She exhibits no edema or tenderness.  Neurological: She is alert and oriented to person, place, and time.  Skin: Skin is warm and dry. No rash noted. She is not diaphoretic. No erythema. No pallor.  Psychiatric: She has a normal mood and affect. Her behavior is normal. Judgment and thought content normal.  Vitals reviewed.     Assessment &  Plan:  1. Hospital discharge follow-up - CBC with Differential/Platelet - Basic metabolic panel  2. Uncontrolled type 1 diabetes mellitus with hyperglycemia (HCC) - CBC with Differential/Platelet - Basic metabolic panel - Three packets of apple sauce given to the patient while in the office. Her blood sugar increased to 60. She reports feeling better and not shaky anymore.   3. Intractable vomiting with nausea, vomiting of unspecified type - ondansetron (ZOFRAN) 4 MG tablet; Take 1 tablet (4 mg total) by mouth every 8 (eight) hours as needed for nausea or vomiting.  Dispense: 20 tablet; Refill: 1

## 2016-02-06 NOTE — Patient Outreach (Signed)
Chart reviewed, community case management episodes resolved to comply with case closure.

## 2016-02-06 NOTE — Patient Instructions (Signed)
It was great seeing you again today.   I will follow up with you regarding your blood work.   Follow up with Endocrinology as scheduled.   Monitor blood sugar at home.

## 2016-02-07 ENCOUNTER — Ambulatory Visit: Payer: Self-pay

## 2016-02-07 DIAGNOSIS — E119 Type 2 diabetes mellitus without complications: Secondary | ICD-10-CM | POA: Diagnosis not present

## 2016-02-08 ENCOUNTER — Encounter (HOSPITAL_COMMUNITY): Payer: Self-pay | Admitting: *Deleted

## 2016-02-08 ENCOUNTER — Emergency Department (HOSPITAL_COMMUNITY): Payer: Commercial Managed Care - HMO

## 2016-02-08 ENCOUNTER — Inpatient Hospital Stay (HOSPITAL_COMMUNITY): Payer: Commercial Managed Care - HMO

## 2016-02-08 ENCOUNTER — Inpatient Hospital Stay (HOSPITAL_COMMUNITY)
Admission: EM | Admit: 2016-02-08 | Discharge: 2016-02-14 | DRG: 637 | Disposition: A | Discharge status: Home Health | Payer: Commercial Managed Care - HMO | Attending: Internal Medicine | Admitting: Internal Medicine

## 2016-02-08 DIAGNOSIS — G9341 Metabolic encephalopathy: Secondary | ICD-10-CM | POA: Diagnosis not present

## 2016-02-08 DIAGNOSIS — T45515A Adverse effect of anticoagulants, initial encounter: Secondary | ICD-10-CM | POA: Diagnosis present

## 2016-02-08 DIAGNOSIS — Z823 Family history of stroke: Secondary | ICD-10-CM

## 2016-02-08 DIAGNOSIS — R4182 Altered mental status, unspecified: Secondary | ICD-10-CM | POA: Diagnosis present

## 2016-02-08 DIAGNOSIS — I5042 Chronic combined systolic (congestive) and diastolic (congestive) heart failure: Secondary | ICD-10-CM | POA: Diagnosis not present

## 2016-02-08 DIAGNOSIS — E875 Hyperkalemia: Secondary | ICD-10-CM

## 2016-02-08 DIAGNOSIS — Z794 Long term (current) use of insulin: Secondary | ICD-10-CM | POA: Diagnosis not present

## 2016-02-08 DIAGNOSIS — Z6826 Body mass index (BMI) 26.0-26.9, adult: Secondary | ICD-10-CM

## 2016-02-08 DIAGNOSIS — E1011 Type 1 diabetes mellitus with ketoacidosis with coma: Secondary | ICD-10-CM | POA: Diagnosis not present

## 2016-02-08 DIAGNOSIS — Z888 Allergy status to other drugs, medicaments and biological substances status: Secondary | ICD-10-CM | POA: Diagnosis not present

## 2016-02-08 DIAGNOSIS — E872 Acidosis, unspecified: Secondary | ICD-10-CM

## 2016-02-08 DIAGNOSIS — Z7901 Long term (current) use of anticoagulants: Secondary | ICD-10-CM | POA: Diagnosis not present

## 2016-02-08 DIAGNOSIS — D638 Anemia in other chronic diseases classified elsewhere: Secondary | ICD-10-CM | POA: Diagnosis present

## 2016-02-08 DIAGNOSIS — Z8673 Personal history of transient ischemic attack (TIA), and cerebral infarction without residual deficits: Secondary | ICD-10-CM | POA: Diagnosis not present

## 2016-02-08 DIAGNOSIS — F329 Major depressive disorder, single episode, unspecified: Secondary | ICD-10-CM | POA: Diagnosis present

## 2016-02-08 DIAGNOSIS — A419 Sepsis, unspecified organism: Secondary | ICD-10-CM

## 2016-02-08 DIAGNOSIS — Z23 Encounter for immunization: Secondary | ICD-10-CM

## 2016-02-08 DIAGNOSIS — I639 Cerebral infarction, unspecified: Secondary | ICD-10-CM | POA: Diagnosis present

## 2016-02-08 DIAGNOSIS — Z8249 Family history of ischemic heart disease and other diseases of the circulatory system: Secondary | ICD-10-CM | POA: Diagnosis not present

## 2016-02-08 DIAGNOSIS — K219 Gastro-esophageal reflux disease without esophagitis: Secondary | ICD-10-CM | POA: Diagnosis present

## 2016-02-08 DIAGNOSIS — M069 Rheumatoid arthritis, unspecified: Secondary | ICD-10-CM | POA: Diagnosis present

## 2016-02-08 DIAGNOSIS — E876 Hypokalemia: Secondary | ICD-10-CM | POA: Diagnosis not present

## 2016-02-08 DIAGNOSIS — E1010 Type 1 diabetes mellitus with ketoacidosis without coma: Secondary | ICD-10-CM

## 2016-02-08 DIAGNOSIS — I2510 Atherosclerotic heart disease of native coronary artery without angina pectoris: Secondary | ICD-10-CM | POA: Diagnosis not present

## 2016-02-08 DIAGNOSIS — I110 Hypertensive heart disease with heart failure: Secondary | ICD-10-CM | POA: Diagnosis present

## 2016-02-08 DIAGNOSIS — I10 Essential (primary) hypertension: Secondary | ICD-10-CM | POA: Diagnosis not present

## 2016-02-08 DIAGNOSIS — E7800 Pure hypercholesterolemia, unspecified: Secondary | ICD-10-CM | POA: Diagnosis present

## 2016-02-08 DIAGNOSIS — R578 Other shock: Secondary | ICD-10-CM | POA: Diagnosis present

## 2016-02-08 DIAGNOSIS — R1110 Vomiting, unspecified: Secondary | ICD-10-CM

## 2016-02-08 DIAGNOSIS — E785 Hyperlipidemia, unspecified: Secondary | ICD-10-CM | POA: Diagnosis present

## 2016-02-08 DIAGNOSIS — G9389 Other specified disorders of brain: Secondary | ICD-10-CM | POA: Diagnosis not present

## 2016-02-08 DIAGNOSIS — Z951 Presence of aortocoronary bypass graft: Secondary | ICD-10-CM

## 2016-02-08 DIAGNOSIS — Z955 Presence of coronary angioplasty implant and graft: Secondary | ICD-10-CM | POA: Diagnosis not present

## 2016-02-08 DIAGNOSIS — E669 Obesity, unspecified: Secondary | ICD-10-CM | POA: Diagnosis not present

## 2016-02-08 DIAGNOSIS — R410 Disorientation, unspecified: Secondary | ICD-10-CM | POA: Diagnosis not present

## 2016-02-08 DIAGNOSIS — E1110 Type 2 diabetes mellitus with ketoacidosis without coma: Secondary | ICD-10-CM | POA: Diagnosis present

## 2016-02-08 DIAGNOSIS — G9340 Encephalopathy, unspecified: Secondary | ICD-10-CM

## 2016-02-08 DIAGNOSIS — F418 Other specified anxiety disorders: Secondary | ICD-10-CM | POA: Diagnosis present

## 2016-02-08 DIAGNOSIS — Z8672 Personal history of thrombophlebitis: Secondary | ICD-10-CM | POA: Diagnosis not present

## 2016-02-08 DIAGNOSIS — D689 Coagulation defect, unspecified: Secondary | ICD-10-CM

## 2016-02-08 DIAGNOSIS — Z452 Encounter for adjustment and management of vascular access device: Secondary | ICD-10-CM | POA: Diagnosis not present

## 2016-02-08 DIAGNOSIS — I252 Old myocardial infarction: Secondary | ICD-10-CM | POA: Diagnosis not present

## 2016-02-08 DIAGNOSIS — N179 Acute kidney failure, unspecified: Secondary | ICD-10-CM | POA: Diagnosis present

## 2016-02-08 DIAGNOSIS — I6789 Other cerebrovascular disease: Secondary | ICD-10-CM | POA: Diagnosis not present

## 2016-02-08 DIAGNOSIS — Z7982 Long term (current) use of aspirin: Secondary | ICD-10-CM

## 2016-02-08 DIAGNOSIS — K8020 Calculus of gallbladder without cholecystitis without obstruction: Secondary | ICD-10-CM | POA: Diagnosis not present

## 2016-02-08 DIAGNOSIS — R1314 Dysphagia, pharyngoesophageal phase: Secondary | ICD-10-CM | POA: Diagnosis not present

## 2016-02-08 DIAGNOSIS — Z79899 Other long term (current) drug therapy: Secondary | ICD-10-CM

## 2016-02-08 DIAGNOSIS — E1165 Type 2 diabetes mellitus with hyperglycemia: Secondary | ICD-10-CM | POA: Diagnosis not present

## 2016-02-08 DIAGNOSIS — R0602 Shortness of breath: Secondary | ICD-10-CM | POA: Diagnosis not present

## 2016-02-08 DIAGNOSIS — R791 Abnormal coagulation profile: Secondary | ICD-10-CM | POA: Diagnosis present

## 2016-02-08 DIAGNOSIS — Z952 Presence of prosthetic heart valve: Secondary | ICD-10-CM

## 2016-02-08 DIAGNOSIS — E119 Type 2 diabetes mellitus without complications: Secondary | ICD-10-CM | POA: Diagnosis not present

## 2016-02-08 DIAGNOSIS — E1310 Other specified diabetes mellitus with ketoacidosis without coma: Secondary | ICD-10-CM | POA: Diagnosis not present

## 2016-02-08 DIAGNOSIS — F4489 Other dissociative and conversion disorders: Secondary | ICD-10-CM

## 2016-02-08 DIAGNOSIS — R402421 Glasgow coma scale score 9-12, in the field [EMT or ambulance]: Secondary | ICD-10-CM | POA: Diagnosis not present

## 2016-02-08 DIAGNOSIS — E0810 Diabetes mellitus due to underlying condition with ketoacidosis without coma: Secondary | ICD-10-CM | POA: Diagnosis not present

## 2016-02-08 LAB — I-STAT CG4 LACTIC ACID, ED: Lactic Acid, Venous: 9.33 mmol/L (ref 0.5–2.0)

## 2016-02-08 LAB — CBC
HCT: 28.9 % — ABNORMAL LOW (ref 36.0–46.0)
HEMOGLOBIN: 7.8 g/dL — AB (ref 12.0–15.0)
MCH: 24.1 pg — AB (ref 26.0–34.0)
MCHC: 27 g/dL — AB (ref 30.0–36.0)
MCV: 89.5 fL (ref 78.0–100.0)
Platelets: 516 10*3/uL — ABNORMAL HIGH (ref 150–400)
RBC: 3.23 MIL/uL — ABNORMAL LOW (ref 3.87–5.11)
RDW: 19.8 % — AB (ref 11.5–15.5)
WBC: 27 10*3/uL — ABNORMAL HIGH (ref 4.0–10.5)

## 2016-02-08 LAB — CBC WITH DIFFERENTIAL/PLATELET
BASOS PCT: 0 %
Basophils Absolute: 0 10*3/uL (ref 0.0–0.1)
EOS ABS: 0 10*3/uL (ref 0.0–0.7)
Eosinophils Relative: 0 %
HCT: 36.8 % (ref 36.0–46.0)
Hemoglobin: 11 g/dL — ABNORMAL LOW (ref 12.0–15.0)
LYMPHS PCT: 20 %
Lymphs Abs: 5.2 10*3/uL — ABNORMAL HIGH (ref 0.7–4.0)
MCH: 25.2 pg — AB (ref 26.0–34.0)
MCHC: 29.9 g/dL — ABNORMAL LOW (ref 30.0–36.0)
MCV: 84.2 fL (ref 78.0–100.0)
MONO ABS: 1.6 10*3/uL — AB (ref 0.1–1.0)
Monocytes Relative: 6 %
NEUTROS ABS: 19.4 10*3/uL — AB (ref 1.7–7.7)
Neutrophils Relative %: 74 %
PLATELETS: 593 10*3/uL — AB (ref 150–400)
RBC: 4.37 MIL/uL (ref 3.87–5.11)
RDW: 20.4 % — AB (ref 11.5–15.5)
WBC: 26.2 10*3/uL — ABNORMAL HIGH (ref 4.0–10.5)

## 2016-02-08 LAB — COMPREHENSIVE METABOLIC PANEL
ALBUMIN: 3.2 g/dL — AB (ref 3.5–5.0)
ALK PHOS: 114 U/L (ref 38–126)
ALT: 15 U/L (ref 14–54)
AST: 22 U/L (ref 15–41)
BUN: 21 mg/dL — ABNORMAL HIGH (ref 6–20)
CALCIUM: 8.8 mg/dL — AB (ref 8.9–10.3)
CREATININE: 2.74 mg/dL — AB (ref 0.44–1.00)
Chloride: 97 mmol/L — ABNORMAL LOW (ref 101–111)
GFR calc Af Amer: 21 mL/min — ABNORMAL LOW (ref >60)
GFR calc non Af Amer: 18 mL/min — ABNORMAL LOW (ref >60)
GLUCOSE: 1300 mg/dL — AB (ref 65–99)
Potassium: >7.5 mmol/L (ref 3.5–5.1)
SODIUM: 131 mmol/L — AB (ref 135–145)
Total Bilirubin: 0.9 mg/dL (ref 0.3–1.2)
Total Protein: 6.5 g/dL (ref 6.5–8.1)

## 2016-02-08 LAB — BASIC METABOLIC PANEL
Anion gap: 24 — ABNORMAL HIGH (ref 5–15)
Anion gap: 24 — ABNORMAL HIGH (ref 5–15)
BUN: 24 mg/dL — AB (ref 6–20)
BUN: 19 mg/dL (ref 6–20)
BUN: 18 mg/dL (ref 6–20)
CHLORIDE: 103 mmol/L (ref 101–111)
CHLORIDE: 108 mmol/L (ref 101–111)
CHLORIDE: 117 mmol/L — AB (ref 101–111)
CO2: 8 mmol/L — AB (ref 22–32)
CO2: 8 mmol/L — AB (ref 22–32)
CREATININE: 2.42 mg/dL — AB (ref 0.44–1.00)
CREATININE: 2.44 mg/dL — AB (ref 0.44–1.00)
CREATININE: 2.1 mg/dL — AB (ref 0.44–1.00)
Calcium: 7.7 mg/dL — ABNORMAL LOW (ref 8.9–10.3)
Calcium: 7.3 mg/dL — ABNORMAL LOW (ref 8.9–10.3)
Calcium: 7.6 mg/dL — ABNORMAL LOW (ref 8.9–10.3)
GFR calc Af Amer: 25 mL/min — ABNORMAL LOW (ref >60)
GFR calc Af Amer: 29 mL/min — ABNORMAL LOW (ref >60)
GFR calc non Af Amer: 21 mL/min — ABNORMAL LOW (ref >60)
GFR calc non Af Amer: 21 mL/min — ABNORMAL LOW (ref >60)
GFR calc non Af Amer: 25 mL/min — ABNORMAL LOW (ref >60)
GFR, EST AFRICAN AMERICAN: 25 mL/min — AB (ref >60)
GLUCOSE: 1128 mg/dL — AB (ref 65–99)
GLUCOSE: 682 mg/dL — AB (ref 65–99)
Glucose, Bld: 1194 mg/dL (ref 65–99)
Potassium: 7.2 mmol/L (ref 3.5–5.1)
Potassium: 3.1 mmol/L — ABNORMAL LOW (ref 3.5–5.1)
Potassium: 2.8 mmol/L — ABNORMAL LOW (ref 3.5–5.1)
SODIUM: 138 mmol/L (ref 135–145)
Sodium: 140 mmol/L (ref 135–145)
Sodium: 149 mmol/L — ABNORMAL HIGH (ref 135–145)

## 2016-02-08 LAB — URINALYSIS, ROUTINE W REFLEX MICROSCOPIC
BILIRUBIN URINE: NEGATIVE
Ketones, ur: >80 mg/dL — AB
Leukocytes, UA: NEGATIVE
NITRITE: NEGATIVE
PH: 5 (ref 5.0–8.0)
Protein, ur: NEGATIVE mg/dL
SPECIFIC GRAVITY, URINE: 1.021 (ref 1.005–1.030)

## 2016-02-08 LAB — PROTIME-INR
INR: 7.56 — AB (ref 0.00–1.49)
INR: 8.44 (ref 0.00–1.49)
PROTHROMBIN TIME: 66.6 s — AB (ref 11.6–15.2)
Prothrombin Time: 61.3 seconds — ABNORMAL HIGH (ref 11.6–15.2)

## 2016-02-08 LAB — GLUCOSE, CAPILLARY: Glucose-Capillary: >600 mg/dL (ref 65–99)

## 2016-02-08 LAB — TYPE AND SCREEN
ABO/RH(D): A POS
Antibody Screen: NEGATIVE

## 2016-02-08 LAB — URINE MICROSCOPIC-ADD ON

## 2016-02-08 LAB — MRSA PCR SCREENING: MRSA BY PCR: NEGATIVE

## 2016-02-08 LAB — CBG MONITORING, ED
Glucose-Capillary: >600 mg/dL (ref 65–99)
Glucose-Capillary: >600 mg/dL (ref 65–99)

## 2016-02-08 LAB — LIPASE, BLOOD: LIPASE: 51 U/L (ref 11–51)

## 2016-02-08 LAB — I-STAT TROPONIN, ED: Troponin i, poc: 0 ng/mL (ref 0.00–0.08)

## 2016-02-08 LAB — PROCALCITONIN: Procalcitonin: 4.82 ng/mL

## 2016-02-08 LAB — LACTIC ACID, PLASMA: LACTIC ACID, VENOUS: 7.7 mmol/L — AB (ref 0.5–2.0)

## 2016-02-08 LAB — PHOSPHORUS: PHOSPHORUS: 11.5 mg/dL — AB (ref 2.5–4.6)

## 2016-02-08 LAB — MAGNESIUM: Magnesium: 2.5 mg/dL — ABNORMAL HIGH (ref 1.7–2.4)

## 2016-02-08 MED ORDER — HEPARIN SODIUM (PORCINE) 5000 UNIT/ML IJ SOLN: 5000.0000 [IU] | Freq: Three times a day (TID) | INTRAMUSCULAR | Status: DC

## 2016-02-08 MED ORDER — VANCOMYCIN HCL 10 G IV SOLR
1500.0000 mg | Freq: Once | INTRAVENOUS | Status: AC
  Administered 2016-02-08: 1500 mg via INTRAVENOUS
  Filled 2016-02-08: qty 1500

## 2016-02-08 MED ORDER — CHLORHEXIDINE GLUCONATE 0.12 % MT SOLN
15.0000 mL | Freq: Two times a day (BID) | OROMUCOSAL | Status: DC
  Administered 2016-02-08 – 2016-02-10 (×4): 15 mL via OROMUCOSAL
  Filled 2016-02-08: qty 15

## 2016-02-08 MED ORDER — SODIUM CHLORIDE 0.9 % IV BOLUS (SEPSIS)
1000.0000 mL | Freq: Once | INTRAVENOUS | Status: AC
  Administered 2016-02-08: 1000 mL via INTRAVENOUS

## 2016-02-08 MED ORDER — DEXTROSE-NACL 5-0.45 % IV SOLN
INTRAVENOUS | Status: DC
  Administered 2016-02-09 – 2016-02-10 (×4): via INTRAVENOUS

## 2016-02-08 MED ORDER — ALBUTEROL (5 MG/ML) CONTINUOUS INHALATION SOLN
10.0000 mg/h | INHALATION_SOLUTION | Freq: Once | RESPIRATORY_TRACT | Status: AC
  Administered 2016-02-08: 10 mg/h via RESPIRATORY_TRACT
  Filled 2016-02-08: qty 20

## 2016-02-08 MED ORDER — SODIUM CHLORIDE 0.9 % IV SOLN
INTRAVENOUS | Status: DC
  Administered 2016-02-08: 125 mL/h via INTRAVENOUS

## 2016-02-08 MED ORDER — SODIUM CHLORIDE 0.9 % IV SOLN
250.0000 mL | INTRAVENOUS | Status: DC | PRN
  Administered 2016-02-09: 14:00:00 via INTRAVENOUS

## 2016-02-08 MED ORDER — CETYLPYRIDINIUM CHLORIDE 0.05 % MT LIQD
7.0000 mL | Freq: Two times a day (BID) | OROMUCOSAL | Status: DC
  Administered 2016-02-09 (×2): 7 mL via OROMUCOSAL

## 2016-02-08 MED ORDER — VANCOMYCIN HCL IN DEXTROSE 750-5 MG/150ML-% IV SOLN
750.0000 mg | INTRAVENOUS | Status: DC
  Administered 2016-02-09: 750 mg via INTRAVENOUS
  Filled 2016-02-08 (×2): qty 150

## 2016-02-08 MED ORDER — POTASSIUM CHLORIDE IN NACL 20-0.45 MEQ/L-% IV SOLN
INTRAVENOUS | Status: DC
  Administered 2016-02-08: 22:00:00 via INTRAVENOUS
  Filled 2016-02-08 (×5): qty 1000

## 2016-02-08 MED ORDER — SODIUM CHLORIDE 0.9 % IV SOLN
INTRAVENOUS | Status: DC
  Administered 2016-02-08: 5.4 [IU]/h via INTRAVENOUS
  Administered 2016-02-09: 2.1 [IU]/h via INTRAVENOUS
  Administered 2016-02-10: 1.8 [IU]/h via INTRAVENOUS
  Filled 2016-02-08 (×2): qty 2.5

## 2016-02-08 MED ORDER — SODIUM BICARBONATE 8.4 % IV SOLN
50.0000 meq | Freq: Once | INTRAVENOUS | Status: AC
  Administered 2016-02-08: 50 meq via INTRAVENOUS
  Filled 2016-02-08: qty 50

## 2016-02-08 MED ORDER — NOREPINEPHRINE BITARTRATE 1 MG/ML IV SOLN
0.0000 ug/min | INTRAVENOUS | Status: DC
  Filled 2016-02-08 (×2): qty 4

## 2016-02-08 MED ORDER — LACTATED RINGERS IV BOLUS (SEPSIS)
1000.0000 mL | Freq: Once | INTRAVENOUS | Status: AC
  Administered 2016-02-08: 1000 mL via INTRAVENOUS

## 2016-02-08 MED ORDER — PIPERACILLIN-TAZOBACTAM 3.375 G IVPB
3.3750 g | Freq: Three times a day (TID) | INTRAVENOUS | Status: DC
  Administered 2016-02-08 – 2016-02-12 (×11): 3.375 g via INTRAVENOUS
  Filled 2016-02-08 (×14): qty 50

## 2016-02-08 MED ORDER — POTASSIUM CHLORIDE 10 MEQ/50ML IV SOLN
10.0000 meq | INTRAVENOUS | Status: AC
  Administered 2016-02-08 – 2016-02-09 (×6): 10 meq via INTRAVENOUS
  Filled 2016-02-08 (×6): qty 50

## 2016-02-08 MED ORDER — SODIUM CHLORIDE 0.9 % IV BOLUS (SEPSIS)
2000.0000 mL | Freq: Once | INTRAVENOUS | Status: AC
  Administered 2016-02-08: 1000 mL via INTRAVENOUS

## 2016-02-08 MED ORDER — SODIUM CHLORIDE 0.9 % IV BOLUS (SEPSIS)
1000.0000 mL | Freq: Once | INTRAVENOUS | Status: AC
  Administered 2016-02-08 (×2): 1000 mL via INTRAVENOUS

## 2016-02-08 MED ORDER — SODIUM CHLORIDE 0.9% FLUSH: 10.0000 mL | INTRAVENOUS | Status: DC | PRN

## 2016-02-08 MED ORDER — PIPERACILLIN-TAZOBACTAM 3.375 G IVPB 30 MIN
3.3750 g | Freq: Once | INTRAVENOUS | Status: AC
  Administered 2016-02-08: 3.375 g via INTRAVENOUS
  Filled 2016-02-08: qty 50

## 2016-02-08 MED ORDER — NOREPINEPHRINE BITARTRATE 1 MG/ML IV SOLN
0.0000 ug/min | Freq: Once | INTRAVENOUS | Status: AC
  Administered 2016-02-08: 2 ug/min via INTRAVENOUS
  Filled 2016-02-08: qty 4

## 2016-02-08 MED ORDER — SODIUM CHLORIDE 0.9 % IV SOLN
750.0000 mL | INTRAVENOUS | Status: DC | PRN
Start: 2016-02-08 — End: 2016-02-13

## 2016-02-08 MED ORDER — LACTATED RINGERS IV BOLUS (SEPSIS)
1000.0000 mL | Freq: Once | INTRAVENOUS | Status: AC
Start: 2016-02-08 — End: 2016-02-09
  Administered 2016-02-08: 1000 mL via INTRAVENOUS

## 2016-02-08 MED ORDER — SODIUM BICARBONATE 8.4 % IV SOLN
INTRAVENOUS | Status: DC
  Administered 2016-02-08: 14:00:00 via INTRAVENOUS
  Filled 2016-02-08: qty 150

## 2016-02-08 MED ORDER — SODIUM CHLORIDE 0.9% FLUSH
10.0000 mL | Freq: Two times a day (BID) | INTRAVENOUS | Status: DC
  Administered 2016-02-09 – 2016-02-10 (×4): 10 mL
  Administered 2016-02-12 – 2016-02-13 (×3): 20 mL

## 2016-02-08 NOTE — Progress Notes (Signed)
eLink Physician-Brief Progress Note Patient Name: Madison [name] DOB: Feb 03, 1961 MRN: [id number]   Date of Service  02/08/2016  HPI/Events of Note  Patient's serum potassium Now 2.8 & sodium 149. Insulin infusion running at 10 units per hour. Last serum glucose was in the 300s. vasopressor requirement has decreased slightly.  eICU Interventions  1. LR 1 L IV Over 1 hour 2. KCl 10 mEq IV 6 runs 3. Decrease insulin infusion rate to 3 units per hour     Intervention Category Intermediate Interventions: Electrolyte abnormality - evaluation and management  [name] 02/08/2016, 10:48 PM

## 2016-02-08 NOTE — Progress Notes (Signed)
eLink Physician-Brief Progress Note Patient Name: Madison [name] DOB: 11/23/1960 MRN: [id number]   Date of Service  02/08/2016  HPI/Events of Note  Camera check on patient. Currently comfortable. Saturating 100% with mask around her neck. Currently requiring Levophed 5 with CVP 5.Nurse reports patient is becoming more alert and responsive.  eICU Interventions  1. Normal saline 2000 mL over 2 hours 2. Resume maintenance IV fluid once bolus complete     Intervention Category Major Interventions: Shock - evaluation and management  [name] 02/08/2016, 5:49 PM

## 2016-02-08 NOTE — H&P (Addendum)
PULMONARY / CRITICAL CARE MEDICINE   Name: Madison [name] MRN: [id number] DOB: [birth year]    ADMISSION DATE:  02/08/2016 CONSULTATION DATE:  3/22 REFERRING MD:  Rubin Payor   CHIEF COMPLAINT:   Acute encephalopathy and DKA  HISTORY OF PRESENT ILLNESS:   This is a 55 year old female w/ known h/o severe and difficult to control DM. Last seen by PCP on 3/20 w/ report of nausea and vomiting. Poor PO intake. Glucose at that time in office was 47; but had been up to 311 the evening before. She was treated w/ zofran and sent home. Little hx is available from that time on; other than EMS was called d/t pt found w/ altered mental status. On arrival CBG to high to measure. On ED arrival w/ bicarb < 7, Ph 6.9. Unresponsive, w/ increased RR. PCCM was asked to admit.   PAST MEDICAL HISTORY :  She  has a past medical history of CAD (coronary artery disease); Obesity; Hypercholesteremia; HTN (hypertension); Dyslipidemia; Aortic stenosis; Thrombophlebitis; Heart murmur; Myocardial infarction (HCC) (12/2014); Pneumonia (11/2014; 12/2014); GERD (gastroesophageal reflux disease); Stroke syndrome (HCC) (1995; 2001); Anxiety; Depression; Allergy; CHF (congestive heart failure) (HCC); Rheumatoid arthritis(714.0); and Diabetes (HCC) (dx'd 1982).  PAST SURGICAL HISTORY: She  has past surgical history that includes Cesarean section (1995); Foot fracture surgery; Knee arthroscopy (Right); Neuroplasty / transposition median nerve at carpal tunnel; left heart catheterization with coronary angiogram (N/A, 12/08/2014); percutaneous coronary stent intervention (pci-s) (N/A, 12/09/2014); Cardiac catheterization (12/08/2014); Coronary angioplasty with stent (12/09/2014); Aortic valve replacement (avr)/coronary artery bypass grafting (cabg) ("2014"); Fracture surgery; Tubal ligation (1995); Cataract extraction (Left); Coronary artery bypass graft; and ORIF ankle fracture (Left, 07/04/2015).  Allergies  Allergen Reactions  . Other Other  (See Comments)    Can burn skin if left on too long.TAPE  . Celebrex [Celecoxib] Rash  . Detrol [Tolterodine] Hives    No current facility-administered medications on file prior to encounter.   Current Outpatient Prescriptions on File Prior to Encounter  Medication Sig  . acetaminophen (TYLENOL) 325 MG tablet Take 2 tablets (650 mg total) by mouth every 6 (six) hours as needed for mild pain (temp > 101.5).  Marland Kitchen aspirin 81 MG tablet Take 1 tablet (81 mg total) by mouth daily.  Marland Kitchen escitalopram (LEXAPRO) 20 MG tablet Take 1 tablet (20 mg total) by mouth daily.  . insulin aspart protamine - aspart (NOVOLOG MIX 70/30 FLEXPEN) (70-30) 100 UNIT/ML FlexPen Inject 14 units in am and 9 units at bedtime  . lisinopril (PRINIVIL,ZESTRIL) 2.5 MG tablet Take 2.5 mg by mouth daily.   Marland Kitchen omeprazole (PRILOSEC) 40 MG capsule TAKE 1 CAPSULE (40 MG TOTAL) BY MOUTH DAILY.  Marland Kitchen ondansetron (ZOFRAN) 4 MG tablet Take 1 tablet (4 mg total) by mouth every 8 (eight) hours as needed for nausea or vomiting.  . ondansetron (ZOFRAN) 4 MG tablet Take 1 tablet (4 mg total) by mouth every 8 (eight) hours as needed for nausea or vomiting.  . pravastatin (PRAVACHOL) 20 MG tablet Take 1 tablet (20 mg total) by mouth daily.  . solifenacin (VESICARE) 5 MG tablet Take 1 tablet (5 mg total) by mouth daily.  Marland Kitchen warfarin (COUMADIN) 7.5 MG tablet Take 1 tablet daily or as directed by coumadin clinic.    FAMILY HISTORY:  Her indicated that her mother is alive. She indicated that her father is deceased. She indicated that all of her five sisters are alive. She indicated that both of her brothers are alive.   SOCIAL HISTORY:  She  reports that she has never smoked. She has never used smokeless tobacco. She reports that she does not drink alcohol or use illicit drugs.  REVIEW OF SYSTEMS:   Unable   SUBJECTIVE:  Acutely ill appearing white female. Responds w/ groans only   VITAL SIGNS: BP 84/36 mmHg  Pulse 98  Resp 32  SpO2  100% 100% HEMODYNAMICS:    VENTILATOR SETTINGS:    INTAKE / OUTPUT:    PHYSICAL EXAMINATION: General:  Chronically ill appearing white female. Moans only. Minimally responsive  Neuro:  Opens eyes, moans only, no focal def  HEENT:  Mucous membranes are dry, neck vein is flat  Cardiovascular:  Rrr, + SEM + metallic click Lungs:  Clear w/ + accessory use and kussmaul resp efforts  Abdomen:  Soft, + bowel sounds  Musculoskeletal:  Intact, no focal def  Skin:  Warm, dry and intact   LABS:  BMET  Recent Labs Lab 02/06/16 1030 02/08/16 1141  NA 135 131*  K 4.0 >7.5*  CL 98 97*  CO2 24 <7*  BUN 17 21*  CREATININE 0.98 2.74*  GLUCOSE 64* 1300*    Electrolytes  Recent Labs Lab 02/06/16 1030 02/08/16 1141  CALCIUM 9.8 8.8*    CBC  Recent Labs Lab 02/06/16 1030 02/08/16 1141  WBC 12.7* 26.2*  HGB 10.4* 11.0*  HCT 32.5* 36.8  PLT 502.0* 593*    Coag's  Recent Labs Lab 02/06/16 02/08/16 1141 02/08/16 1337  INR 3.8 7.56* 8.44*    Sepsis Markers  Recent Labs Lab 02/08/16 1149  LATICACIDVEN 9.33*    ABG No results for input(s): PHART, PCO2ART, PO2ART in the last 168 hours.  Liver Enzymes  Recent Labs Lab 02/08/16 1141  AST 22  ALT 15  ALKPHOS 114  BILITOT 0.9  ALBUMIN 3.2*    Cardiac Enzymes No results for input(s): TROPONINI, PROBNP in the last 168 hours.  Glucose  Recent Labs Lab 02/08/16 1139 02/08/16 1410  GLUCAP >600* >600*    Imaging Dg Chest Port 1 View  02/08/2016  CLINICAL DATA:  Central line placement. EXAM: PORTABLE CHEST 1 VIEW COMPARISON:  Portable film earlier today. FINDINGS: RIGHT IJ catheter tip lies in the proximal RIGHT atrium. No pneumothorax. Unchanged aeration. Prior median sternotomy. IMPRESSION: Satisfactory appearance status post RIGHT IJ catheter placement. Tip lies in the proximal RIGHT atrium. Electronically Signed   By: Elsie Stain M.D.   On: 02/08/2016 13:09   Dg Chest Portable 1  View  02/08/2016  CLINICAL DATA:  55 year old female with a history of shortness of breath and weakness EXAM: PORTABLE CHEST 1 VIEW COMPARISON:  Multiple prior chest x-ray comparison, most recent 01/27/2016 FINDINGS: Cardiomediastinal silhouette unchanged in size and contour. Surgical changes of prior median sternotomy and CABG. There is uplifting of the minor fissure with peritracheal/paramediastinal density superior to the right mainstem. This is more pronounced than on the comparison plain film. No left-sided confluent airspace disease. No large pleural effusion. No pneumothorax. IMPRESSION: Uplifting of the minor fissure with density along the paramediastinal stripe, concerning for partial volume loss of the right upper lobe. This may reflect mucous plugging in the setting of infection, however, central obstructing mass cannot be excluded. Recommend further evaluation with contrast-enhanced CT. Surgical changes of median sternotomy and CABG. Signed, Yvone Neu. Loreta Ave, DO [facility] Electronically Signed   By: Gilmer Mor D.O.   On: 02/08/2016 12:18     STUDIES:    CULTURES: BCX2 3/22>>> UC  3/22>>>  ANTIBIOTICS: Zosyn 3/22>>> vanc 3/22>>>  SIGNIFICANT EVENTS:   LINES/TUBES: Right IJ CVL 3//22>>>  DISCUSSION: 55 year old female w/ recurrent DKA d/t poorly controlled DM. Just d/c'd 3/8 for same dx. Seen by PCP on 3/20 w/ nausea and vomiting. Now being admitted again w/ DKA, +/- sepsis (seems less likely). Will admit w/ DKA protocol, have added 1 liter bicarb for NAG component of acidosis, she is profoundly acidotic and hyperkalemic but this should improve w/ volume and insulin. Additional issues are hypotension and coumadin induced coagulopathy. She probably needs to be discharged to an SNF after this as clearly she is not doing well at home.   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 Likely multi-factorial: Hypovolemic shock; in setting of DKA; may be a component of cardiogenic due to acidosis and CO suppression. Sepsis also on ddx  cad P:  Coumadin to be followed by pharmacy Repeat lactic acid following fluid challenge  Admit to ICu Cont IVFs Ck CVP   RENAL A:  Mixed AG/NAG metabolic acidosis in setting of lactic acidosis, lactic acidosis/ketoacidosis w/ bicarb loss from vomiting (likely) Profound hyperkalemia  AKI P:   Aggressive volume resuscitation Hold antihypertensives Strict I&O  GASTROINTESTINAL A:   NPO at risk for aspiration  P:   NPO Adv diet as needed   HEMATOLOGIC A:   COUMADIN induced coagulopathy Anemia of chronic disease   P:  Trend cbc Transfuse per ICU protocol  Will hold off on coumadin reversal as no evidence of acute bleeding  Pharmacy will follow INR   INFECTIOUS A:   R/o sepsis  P:   Culture blood and urine  Empiric abx   ENDOCRINE A:   DKA w/ profound hyperglycemia  P:   ssi protocol   NEUROLOGIC A:   Acute Encephalopathy  P:   RASS goal: 0 Hold any sedation    FAMILY  - Updates: pending   - Inter-disciplinary family meet or Palliative Care meeting due by: 4/1   Simonne Martinet ACNP-BC [facility] Pulmonary/Critical Care Pager # [phone] OR # [phone] if no answer   02/08/2016, 2:12 PM  PCCM Attending Note: Patient seen and examined with nurse practitioner. Please refer to his admission H&P which I reviewed in detail.Patient presenting with DKA and altered mental status. Moving all 4 extremities spontaneously.  Hyperkalemia likely secondary to diabetic ketoacidosis. Patient also has lactic acidosis. Cultures pending. No evidence of bleeding at this time therefore holding on  correction of coagulopathy. Continuing empiric broad-spectrum antibiotics now while awaiting culture results. Close monitoring in the intensive care unit on DKA protocol. Trending electrolytes every 4 hours.Continuing fluid resuscitation.  I have spent a total of 34 minutes of critical care time today caring for the patient and reviewing the patient's electronic medical record.  Janace Christen Jamison Neighbor, M.D. [facility] Pulmonary & Critical Care Pager:  [phone] After 3pm or if no response, call [phone] 5:32 PM 02/08/2016

## 2016-02-08 NOTE — ED Provider Notes (Addendum)
CSN: [id number]     Arrival date & time 02/08/16  1132 History   First MD Initiated Contact with Patient 02/08/16 1133     Chief Complaint  Patient presents with  . Hyperglycemia  . Altered Mental Status   Level 5 caveat due to altered mental status.  Patient is a 55 y.o. female presenting with hyperglycemia and altered mental status.  Hyperglycemia Associated symptoms: altered mental status   Altered Mental Status Patient presents with altered mental status. History of DKA. Patient is unresponsive at this time. Sugar was off meter for EMS. History of frequent DKA's.  Past Medical History  Diagnosis Date  . CAD (coronary artery disease)   . Obesity   . Hypercholesteremia   . HTN (hypertension)   . Dyslipidemia   . Aortic stenosis   . Thrombophlebitis   . Heart murmur   . Myocardial infarction (HCC) 12/2014  . Pneumonia 11/2014; 12/2014  . GERD (gastroesophageal reflux disease)   . Stroke syndrome [facility]) 1995; 2001    "when my son was born; problems w/speech and L hand since then" (01/19/2015)  . Anxiety   . Depression   . Allergy   . CHF (congestive heart failure) (HCC)   . Rheumatoid arthritis(714.0)     "hands" (07/01/2015)  . Diabetes (HCC) dx'd 1982    "went straight to insulin"   Past Surgical History  Procedure Laterality Date  . Cesarean section  1995  . Foot fracture surgery    . Knee arthroscopy Right   . Neuroplasty / transposition median nerve at carpal tunnel    . Left heart catheterization with coronary angiogram N/A 12/08/2014    Procedure: LEFT HEART CATHETERIZATION WITH CORONARY ANGIOGRAM;  Surgeon: Iran Ouch, MD;  Location: [facility] CATH LAB;  Service: Cardiovascular;  Laterality: N/A;  . Percutaneous coronary stent intervention (pci-s) N/A 12/09/2014    Procedure: PERCUTANEOUS CORONARY STENT INTERVENTION (PCI-S);  Surgeon: [name], MD;  Location: [facility] CATH LAB;  Service: Cardiovascular;  Laterality: N/A;  . Cardiac catheterization  12/08/2014  .  Coronary angioplasty with stent placement  12/09/2014  . Aortic valve replacement (avr)/coronary artery bypass grafting (cabg)  "2014"    [name] 03/08/2014  . Fracture surgery    . Tubal ligation  1995  . Cataract extraction Left   . Coronary artery bypass graft    . Orif ankle fracture Left 07/04/2015    Procedure: OPEN REDUCTION INTERNAL FIXATION (ORIF)  TRIMAL ANKLE FRACTURE;  Surgeon: [name], MD;  Location: [facility] OR;  Service: Orthopedics;  Laterality: Left;   Family History  Problem Relation Age of Onset  . Stroke    . Heart disease Mother   . Heart disease Sister    Social History  Substance Use Topics  . Smoking status: Never Smoker   . Smokeless tobacco: Never Used  . Alcohol Use: No   OB History    No data available     Review of Systems  Unable to perform ROS: Mental status change      Allergies  Other; Celebrex; and Detrol  Home Medications   Prior to Admission medications   Medication Sig Start Date End Date Taking? Authorizing Provider  acetaminophen (TYLENOL) 325 MG tablet Take 2 tablets (650 mg total) by mouth every 6 (six) hours as needed for mild pain (temp > 101.5). 08/09/15   Starleen Arms, MD  aspirin 81 MG tablet Take 1 tablet (81 mg total) by mouth daily. 10/11/14   Leana Roe  Elgergawy, MD  escitalopram (LEXAPRO) 20 MG tablet Take 1 tablet (20 mg total) by mouth daily. 11/03/15   [name], NP  insulin aspart protamine - aspart (NOVOLOG MIX 70/30 FLEXPEN) (70-30) 100 UNIT/ML FlexPen Inject 14 units in am and 9 units at bedtime 01/30/16   Vassie Loll, MD  lisinopril (PRINIVIL,ZESTRIL) 2.5 MG tablet Take 2.5 mg by mouth daily.  10/22/15   Historical Provider, MD  omeprazole (PRILOSEC) 40 MG capsule TAKE 1 CAPSULE (40 MG TOTAL) BY MOUTH DAILY. 09/12/15   [name], NP  ondansetron (ZOFRAN) 4 MG tablet Take 1 tablet (4 mg total) by mouth every 8 (eight) hours as needed for nausea or vomiting. 01/04/16   [name], NP  ondansetron (ZOFRAN)  4 MG tablet Take 1 tablet (4 mg total) by mouth every 8 (eight) hours as needed for nausea or vomiting. 02/06/16   [name], NP  pravastatin (PRAVACHOL) 20 MG tablet Take 1 tablet (20 mg total) by mouth daily. 08/17/15   [name], NP  solifenacin (VESICARE) 5 MG tablet Take 1 tablet (5 mg total) by mouth daily. 05/03/15   [name], NP  warfarin (COUMADIN) 7.5 MG tablet Take 1 tablet daily or as directed by coumadin clinic. 01/26/16   Cory Nafziger, NP   BP 81/30 mmHg  Pulse 100  Resp 27  SpO2 100% Physical Exam  Constitutional:  Mucous membranes and corneas are dry  HENT:  Head: Atraumatic.  Eyes:  Sluggish pupils.  Neck: Neck supple.  Cardiovascular:  Tachycardia  Pulmonary/Chest:  Tachypnea. Midline chest scar.  Abdominal: There is no tenderness.  Musculoskeletal: She exhibits no edema.  Neurological:  Minimal gag reflex. Breathing spontaneously. Minimal response to pain.  Skin: Skin is warm.    ED Course  Procedures (including critical care time) Labs Review Labs Reviewed  COMPREHENSIVE METABOLIC PANEL - Abnormal; Notable for the following:    Sodium 131 (*)    Potassium >7.5 (*)    Chloride 97 (*)    CO2 <7 (*)    BUN 21 (*)    Creatinine, Ser 2.74 (*)    Calcium 8.8 (*)    Albumin 3.2 (*)    GFR calc non Af Amer 18 (*)    GFR calc Af Amer 21 (*)    All other components within normal limits  PROTIME-INR - Abnormal; Notable for the following:    Prothrombin Time 61.3 (*)    INR 7.56 (*)    All other components within normal limits  I-STAT CG4 LACTIC ACID, ED - Abnormal; Notable for the following:    Lactic Acid, Venous 9.33 (*)    All other components within normal limits  CBG MONITORING, ED - Abnormal; Notable for the following:    Glucose-Capillary >600 (*)    All other components within normal limits  CULTURE, BLOOD (ROUTINE X 2)  CULTURE, BLOOD (ROUTINE X 2)  CULTURE, BLOOD (ROUTINE X 2)  CULTURE, BLOOD (ROUTINE X 2)  URINE CULTURE  BLOOD  GAS, ARTERIAL  CBC WITH DIFFERENTIAL/PLATELET  CBC  LIPASE, BLOOD  LACTIC ACID, PLASMA  PROCALCITONIN  MAGNESIUM  PHOSPHORUS  PROTIME-INR  BASIC METABOLIC PANEL  BASIC METABOLIC PANEL  BASIC METABOLIC PANEL  BASIC METABOLIC PANEL  I-STAT CHEM 8, ED  I-STAT TROPOININ, ED    Imaging Review Dg Chest Portable 1 View  02/08/2016  CLINICAL DATA:  55 year old female with a history of shortness of breath and weakness EXAM: PORTABLE CHEST 1 VIEW COMPARISON:  Multiple prior chest x-ray comparison,  most recent 01/27/2016 FINDINGS: Cardiomediastinal silhouette unchanged in size and contour. Surgical changes of prior median sternotomy and CABG. There is uplifting of the minor fissure with peritracheal/paramediastinal density superior to the right mainstem. This is more pronounced than on the comparison plain film. No left-sided confluent airspace disease. No large pleural effusion. No pneumothorax. IMPRESSION: Uplifting of the minor fissure with density along the paramediastinal stripe, concerning for partial volume loss of the right upper lobe. This may reflect mucous plugging in the setting of infection, however, central obstructing mass cannot be excluded. Recommend further evaluation with contrast-enhanced CT. Surgical changes of median sternotomy and CABG. Signed, Yvone Neu. Loreta Ave, DO [facility] Radiology Electronically Signed   By: Gilmer Mor D.O.   On: 02/08/2016 12:18   I have personally reviewed and evaluated these images and lab results as part of my medical decision-making.   EKG Interpretation   Date/Time:  Wednesday February 08 2016 11:40:57 EDT Ventricular Rate:  97 PR Interval:  168 QRS Duration: 127 QT Interval:  402 QTC Calculation: 511 R Axis:   -42 Text Interpretation:  Sinus rhythm Nonspecific IVCD with LAD Left  ventricular hypertrophy Anterior ST elevation, probably due to LVH  Peaked  T waves. Hyperkalemia? Confirmed by  Rubin Payor  MD, Harrold Donath [phone]) on  02/08/2016 12:40:58 PM      MDM   Final diagnoses:  Diabetic ketoacidosis with coma associated with type 1 diabetes mellitus (HCC)  Hyperkalemia    Patient presents with hyperglycemia. She is minimally responsive. Found to be in a severe DKA with pH of 6.7. Bicarbonate less than 5. Potassium is also elevated at 7.6. Consult in ICU and will be admitted. IV bicarbonate given as was albuterol and IV fluids.   CRITICAL CARE Performed by: Billee Cashing Total critical care time: 30 minutes Critical care time was exclusive of separately billable procedures and treating other patients. Critical care was necessary to treat or prevent imminent or life-threatening deterioration. Critical care was time spent personally by me on the following activities: development of treatment plan with patient and/or surrogate as well as nursing, discussions with consultants, evaluation of patient's response to treatment, examination of patient, obtaining history from patient or surrogate, ordering and performing treatments and interventions, ordering and review of laboratory studies, ordering and review of radiographic studies, pulse oximetry and re-evaluation of patient's condition.     Benjiman Core, MD 02/08/16 1242  Benjiman Core, MD 02/08/16 1242

## 2016-02-08 NOTE — ED Notes (Addendum)
EMS- pt was decreased LOC. Pt will respond to pain. CBG reading "high".  LSN last night.  Pupils rt 6 left 4. Recent DKA admission.  BP114/76. IO left leg. NS en route. Pt on NRB on arrival

## 2016-02-08 NOTE — Progress Notes (Signed)
MD notified of increasing HR 120's since admission at noon and HR in 90's. Order for lactated ringer's bolus of 1000 mL.

## 2016-02-08 NOTE — ED Notes (Signed)
Paged PCCM about INR.

## 2016-02-08 NOTE — ED Notes (Signed)
PCCM at the bedside to place central line 

## 2016-02-08 NOTE — Progress Notes (Signed)
Received call from [facility] reguarding critical lab value K+ 7.2 and Hgb 7.8.  eMD Dr Jamison Neighbor made aware.

## 2016-02-08 NOTE — Progress Notes (Signed)
Pharmacy Antibiotic Note  Madison [name] is a 55 y.o. female admitted on 02/08/2016 with sepsis.  Pharmacy has been consulted for vancomycin and zosyn dosing.Temperature not yet documented, WBC is pending and Scr is above baseline at 2.74.   Plan: - Vancomycin 1500mg  IV x 1 then 750mg  IV Q24H - Zosyn 3.375gm IV Q8H (4 hr inf) - F/u renal fxn, C&S, clinical status and trough at [facility]     No data recorded.   Recent Labs Lab 02/06/16 1030 02/08/16 1141 02/08/16 1149  WBC 12.7* 26.2*  --   CREATININE 0.98 2.74*  --   LATICACIDVEN  --   --  9.33*    Estimated Creatinine Clearance: 21.1 mL/min (by C-G formula based on Cr of 2.74).    Allergies  Allergen Reactions  . Other Other (See Comments)    Can burn skin if left on too long.TAPE  . Celebrex [Celecoxib] Rash  . Detrol [Tolterodine] Hives    Antimicrobials this admission: Vanc 3/22>> Zosyn 3/22>>  Dose adjustments this admission: N/A  Microbiology results: Pending  Thank you for allowing pharmacy to be a part of this patient's care.  Treanna Dumler, 02/10/16 02/08/2016 2:11 PM

## 2016-02-08 NOTE — Progress Notes (Signed)
Pt HR maintaining 130's. MD notified and order for potassium runs. Decreased insulin gtt to 3U/hr.

## 2016-02-08 NOTE — ED Notes (Signed)
Paged PCCM about pts BP.

## 2016-02-09 ENCOUNTER — Inpatient Hospital Stay (HOSPITAL_COMMUNITY): Payer: Commercial Managed Care - HMO

## 2016-02-09 DIAGNOSIS — E876 Hypokalemia: Secondary | ICD-10-CM

## 2016-02-09 DIAGNOSIS — G9341 Metabolic encephalopathy: Secondary | ICD-10-CM

## 2016-02-09 DIAGNOSIS — E1310 Other specified diabetes mellitus with ketoacidosis without coma: Secondary | ICD-10-CM

## 2016-02-09 LAB — GLUCOSE, CAPILLARY
GLUCOSE-CAPILLARY: 563 mg/dL — AB (ref 65–99)
GLUCOSE-CAPILLARY: 398 mg/dL — AB (ref 65–99)
GLUCOSE-CAPILLARY: 193 mg/dL — AB (ref 65–99)
GLUCOSE-CAPILLARY: 187 mg/dL — AB (ref 65–99)
GLUCOSE-CAPILLARY: 162 mg/dL — AB (ref 65–99)
GLUCOSE-CAPILLARY: 134 mg/dL — AB (ref 65–99)
GLUCOSE-CAPILLARY: 197 mg/dL — AB (ref 65–99)
GLUCOSE-CAPILLARY: 109 mg/dL — AB (ref 65–99)
GLUCOSE-CAPILLARY: 133 mg/dL — AB (ref 65–99)
GLUCOSE-CAPILLARY: 193 mg/dL — AB (ref 65–99)
GLUCOSE-CAPILLARY: 158 mg/dL — AB (ref 65–99)
GLUCOSE-CAPILLARY: 142 mg/dL — AB (ref 65–99)
GLUCOSE-CAPILLARY: 145 mg/dL — AB (ref 65–99)
GLUCOSE-CAPILLARY: 170 mg/dL — AB (ref 65–99)
GLUCOSE-CAPILLARY: 194 mg/dL — AB (ref 65–99)
GLUCOSE-CAPILLARY: 166 mg/dL — AB (ref 65–99)
GLUCOSE-CAPILLARY: 105 mg/dL — AB (ref 65–99)
Glucose-Capillary: 153 mg/dL — ABNORMAL HIGH (ref 65–99)
Glucose-Capillary: 255 mg/dL — ABNORMAL HIGH (ref 65–99)
Glucose-Capillary: 291 mg/dL — ABNORMAL HIGH (ref 65–99)
Glucose-Capillary: 304 mg/dL — ABNORMAL HIGH (ref 65–99)
Glucose-Capillary: 461 mg/dL — ABNORMAL HIGH (ref 65–99)
Glucose-Capillary: 163 mg/dL — ABNORMAL HIGH (ref 65–99)
Glucose-Capillary: 116 mg/dL — ABNORMAL HIGH (ref 65–99)
Glucose-Capillary: 200 mg/dL — ABNORMAL HIGH (ref 65–99)
Glucose-Capillary: 120 mg/dL — ABNORMAL HIGH (ref 65–99)
Glucose-Capillary: 126 mg/dL — ABNORMAL HIGH (ref 65–99)

## 2016-02-09 LAB — BASIC METABOLIC PANEL
ANION GAP: 15 (ref 5–15)
Anion gap: 18 — ABNORMAL HIGH (ref 5–15)
Anion gap: 11 (ref 5–15)
BUN: 11 mg/dL (ref 6–20)
BUN: 12 mg/dL (ref 6–20)
BUN: 9 mg/dL (ref 6–20)
CALCIUM: 7.7 mg/dL — AB (ref 8.9–10.3)
CALCIUM: 7.4 mg/dL — AB (ref 8.9–10.3)
CHLORIDE: 118 mmol/L — AB (ref 101–111)
CHLORIDE: 120 mmol/L — AB (ref 101–111)
CHLORIDE: 120 mmol/L — AB (ref 101–111)
CO2: 10 mmol/L — AB (ref 22–32)
CO2: 19 mmol/L — ABNORMAL LOW (ref 22–32)
CO2: 15 mmol/L — AB (ref 22–32)
CREATININE: 1.11 mg/dL — AB (ref 0.44–1.00)
CREATININE: 1.75 mg/dL — AB (ref 0.44–1.00)
Calcium: 7.7 mg/dL — ABNORMAL LOW (ref 8.9–10.3)
Creatinine, Ser: 1.54 mg/dL — ABNORMAL HIGH (ref 0.44–1.00)
GFR calc Af Amer: 37 mL/min — ABNORMAL LOW (ref >60)
GFR calc Af Amer: 43 mL/min — ABNORMAL LOW (ref >60)
GFR calc non Af Amer: 32 mL/min — ABNORMAL LOW (ref >60)
GFR calc non Af Amer: 37 mL/min — ABNORMAL LOW (ref >60)
GFR, EST NON AFRICAN AMERICAN: 55 mL/min — AB (ref >60)
GLUCOSE: 205 mg/dL — AB (ref 65–99)
Glucose, Bld: 347 mg/dL — ABNORMAL HIGH (ref 65–99)
Glucose, Bld: 176 mg/dL — ABNORMAL HIGH (ref 65–99)
POTASSIUM: 4 mmol/L (ref 3.5–5.1)
Potassium: 3.4 mmol/L — ABNORMAL LOW (ref 3.5–5.1)
Potassium: 3.5 mmol/L (ref 3.5–5.1)
SODIUM: 148 mmol/L — AB (ref 135–145)
Sodium: 148 mmol/L — ABNORMAL HIGH (ref 135–145)
Sodium: 150 mmol/L — ABNORMAL HIGH (ref 135–145)

## 2016-02-09 LAB — CBC
HEMATOCRIT: 25.5 % — AB (ref 36.0–46.0)
HEMOGLOBIN: 8 g/dL — AB (ref 12.0–15.0)
MCH: 24.6 pg — ABNORMAL LOW (ref 26.0–34.0)
MCHC: 31.4 g/dL (ref 30.0–36.0)
MCV: 78.5 fL (ref 78.0–100.0)
Platelets: 328 10*3/uL (ref 150–400)
RBC: 3.25 MIL/uL — AB (ref 3.87–5.11)
RDW: 17.9 % — ABNORMAL HIGH (ref 11.5–15.5)
WBC: 16.2 10*3/uL — AB (ref 4.0–10.5)

## 2016-02-09 LAB — LACTIC ACID, PLASMA
Lactic Acid, Venous: 7.5 mmol/L (ref 0.5–2.0)
Lactic Acid, Venous: 1.4 mmol/L (ref 0.5–2.0)

## 2016-02-09 LAB — MAGNESIUM: Magnesium: 1.5 mg/dL — ABNORMAL LOW (ref 1.7–2.4)

## 2016-02-09 LAB — PROCALCITONIN: Procalcitonin: 9.17 ng/mL

## 2016-02-09 LAB — PHOSPHORUS
PHOSPHORUS: 3.3 mg/dL (ref 2.5–4.6)
Phosphorus: <1 mg/dL — CL (ref 2.5–4.6)

## 2016-02-09 LAB — PROTIME-INR
INR: 5.37 — AB (ref 0.00–1.49)
PROTHROMBIN TIME: 47.4 s — AB (ref 11.6–15.2)

## 2016-02-09 MED ORDER — FENTANYL CITRATE (PF) 100 MCG/2ML IJ SOLN
50.0000 ug | INTRAMUSCULAR | Status: DC | PRN
Start: 2016-02-09 — End: 2016-02-12
  Filled 2016-02-09 (×2): qty 2

## 2016-02-09 MED ORDER — SODIUM CHLORIDE 0.9% FLUSH: 10.0000 mL | Freq: Two times a day (BID) | INTRAVENOUS | Status: DC

## 2016-02-09 MED ORDER — SODIUM CHLORIDE 0.9% FLUSH
10.0000 mL | INTRAVENOUS | Status: DC | PRN
Start: 2016-02-09 — End: 2016-02-09

## 2016-02-09 MED ORDER — VITAMIN K1 10 MG/ML IJ SOLN
5.0000 mg | Freq: Once | INTRAVENOUS | Status: AC
  Administered 2016-02-09: 5 mg via INTRAVENOUS
  Filled 2016-02-09: qty 0.5

## 2016-02-09 MED ORDER — CETYLPYRIDINIUM CHLORIDE 0.05 % MT LIQD: 7.0000 mL | Freq: Two times a day (BID) | OROMUCOSAL | Status: DC

## 2016-02-09 MED ORDER — LORAZEPAM 2 MG/ML IJ SOLN
2.0000 mg | INTRAMUSCULAR | Status: DC | PRN
  Administered 2016-02-09 – 2016-02-10 (×3): 2 mg via INTRAVENOUS
  Filled 2016-02-09 (×4): qty 1

## 2016-02-09 MED ORDER — SODIUM CHLORIDE 0.9 % IV SOLN
Freq: Once | INTRAVENOUS | Status: AC
  Administered 2016-02-09: 12:00:00 via INTRAVENOUS

## 2016-02-09 MED ORDER — SODIUM CHLORIDE 0.9 % IV BOLUS (SEPSIS)
500.0000 mL | INTRAVENOUS | Status: AC
  Administered 2016-02-09: 500 mL via INTRAVENOUS

## 2016-02-09 MED ORDER — SODIUM PHOSPHATE 3 MMOLE/ML IV SOLN
30.0000 mmol | Freq: Once | INTRAVENOUS | Status: AC
  Administered 2016-02-09: 30 mmol via INTRAVENOUS
  Filled 2016-02-09: qty 10

## 2016-02-09 NOTE — Telephone Encounter (Signed)
error 

## 2016-02-09 NOTE — Progress Notes (Signed)
CRITICAL VALUE ALERT  Critical value received:  Lactic Acid 7.5   Date of notification:  02/09/16  Time of notification:  0558  Critical value read back:Yes.    Nurse who received alert:  Chapman Moss, RN  MD notified (1st page):  0600   CRITICAL VALUE ALERT  Critical value received:  INR 12.48  Date of notification:  02/09/16  Time of notification:  0603  Critical value read back:Yes.    Nurse who received alert:  Chapman Moss, RN  MD notified (1st page):  [phone]

## 2016-02-09 NOTE — Progress Notes (Signed)
CRITICAL VALUE ALERT  Critical value received:  Phosphorous < 1.0  Date of notification:  02/09/16   Time of notification:  0530  Critical value read back:Yes.    Nurse who received alert:  Chapman Moss, RN  MD notified (1st page):  [phone]

## 2016-02-09 NOTE — Progress Notes (Signed)
PULMONARY / CRITICAL CARE MEDICINE   Name: Madison [name] MRN: [id number] DOB: 12/13/1960    ADMISSION DATE:  02/08/2016 CONSULTATION DATE:  3/22 REFERRING MD:  Rubin Payor   CHIEF COMPLAINT:   Acute encephalopathy and DKA  HISTORY OF PRESENT ILLNESS:   This is a 55 year old female w/ known h/o severe and difficult to control DM. Last seen by PCP on 3/20 w/ report of nausea and vomiting. Poor PO intake. Glucose at that time in office was 47; but had been up to 311 the evening before. She was treated w/ zofran and sent home. Little hx is available from that time on; other than EMS was called d/t pt found w/ altered mental status. On arrival CBG to high to measure. On ED arrival w/ bicarb < 7, Ph 6.9. Unresponsive, w/ increased RR. PCCM was asked to admit.    SUBJECTIVE:  Agitated this am per RN, received 1 dose ativan.  Groans, nods appropriately intermittently.  CVP=6-7, Lactate remains elevated, borderline BP.   VITAL SIGNS: BP 106/50 mmHg  Pulse 122  Temp(Src) 98.3 F (36.8 C) (Oral)  Resp 1  Ht 5\' 4"  (1.626 m)  Wt 68.5 kg (151 lb 0.2 oz)  BMI 25.91 kg/m2  SpO2 97% 100% HEMODYNAMICS: CVP:  [5 mmHg-11 mmHg] 7 mmHg  VENTILATOR SETTINGS:    INTAKE / OUTPUT: I/O last 3 completed shifts: In: 7614.9 [I.V.:5454.9; IV Piggyback:2160] Out: 3355 [Urine:3355]  PHYSICAL EXAMINATION: General:  Chronically ill appearing white female. Moans, nods intermittently  Neuro:  Opens eyes, moans only, no focal def  HEENT:  Mucous membranes are dry, neck vein is flat  Cardiovascular:  Rrr, + SEM + metallic click Lungs:  resps even non labored on [place], diminished bases otherwise clear   Abdomen:  Soft, + bowel sounds  Musculoskeletal:  Intact, no focal def  Skin:  Warm, dry and intact   LABS:  BMET  Recent Labs Lab 02/08/16 2100 02/09/16 0013 02/09/16 0400  NA 149* 148* 150*  K 2.8* 3.4* 4.0  CL 117* 120* 120*  CO2 8* 10* 15*  BUN 18 12 11   CREATININE 2.10* 1.75* 1.54*  GLUCOSE  682* 347* 205*    Electrolytes  Recent Labs Lab 02/08/16 1337  02/08/16 2100 02/09/16 0013 02/09/16 0400  CALCIUM 7.7*  < > 7.6* 7.7* 7.7*  MG 2.5*  --   --   --  1.5*  PHOS 11.5*  --   --   --  <1.0*  < > = values in this interval not displayed.  CBC  Recent Labs Lab 02/08/16 1141 02/08/16 1337 02/09/16 0400  WBC 26.2* 27.0* 16.2*  HGB 11.0* 7.8* 8.0*  HCT 36.8 28.9* 25.5*  PLT 593* 516* 328    Coag's  Recent Labs Lab 02/08/16 1141 02/08/16 1337 02/09/16 0400  INR 7.56* 8.44* 12.48*    Sepsis Markers  Recent Labs Lab 02/08/16 1149 02/08/16 1337 02/08/16 1338 02/09/16 0400  LATICACIDVEN 9.33*  --  7.7* 7.5*  PROCALCITON  --  4.82  --   --     ABG No results for input(s): PHART, PCO2ART, PO2ART in the last 168 hours.  Liver Enzymes  Recent Labs Lab 02/08/16 1141  AST 22  ALT 15  ALKPHOS 114  BILITOT 0.9  ALBUMIN 3.2*    Cardiac Enzymes No results for input(s): TROPONINI, PROBNP in the last 168 hours.  Glucose  Recent Labs Lab 02/09/16 0513 02/09/16 02/11/16 02/09/16 0717 02/09/16 0814 02/09/16 0922 02/09/16 1015  GLUCAP  162* 134* 153* 163* 197* 109*    Imaging Dg Chest Port 1 View  02/08/2016  CLINICAL DATA:  Central line placement. EXAM: PORTABLE CHEST 1 VIEW COMPARISON:  Portable film earlier today. FINDINGS: RIGHT IJ catheter tip lies in the proximal RIGHT atrium. No pneumothorax. Unchanged aeration. Prior median sternotomy. IMPRESSION: Satisfactory appearance status post RIGHT IJ catheter placement. Tip lies in the proximal RIGHT atrium. Electronically Signed   By: Elsie Stain M.D.   On: 02/08/2016 13:09   Dg Chest Portable 1 View  02/08/2016  CLINICAL DATA:  55 year old female with a history of shortness of breath and weakness EXAM: PORTABLE CHEST 1 VIEW COMPARISON:  Multiple prior chest x-ray comparison, most recent 01/27/2016 FINDINGS: Cardiomediastinal silhouette unchanged in size and contour. Surgical changes of prior median  sternotomy and CABG. There is uplifting of the minor fissure with peritracheal/paramediastinal density superior to the right mainstem. This is more pronounced than on the comparison plain film. No left-sided confluent airspace disease. No large pleural effusion. No pneumothorax. IMPRESSION: Uplifting of the minor fissure with density along the paramediastinal stripe, concerning for partial volume loss of the right upper lobe. This may reflect mucous plugging in the setting of infection, however, central obstructing mass cannot be excluded. Recommend further evaluation with contrast-enhanced CT. Surgical changes of median sternotomy and CABG. Signed, Yvone Neu. Loreta Ave, DO [facility] Electronically Signed   By: Gilmer Mor D.O.   On: 02/08/2016 12:18     STUDIES:    CULTURES: BCX2 3/22>>> UC 3/22>>>  ANTIBIOTICS: Zosyn 3/22>>> vanc 3/22>>>  SIGNIFICANT EVENTS:   LINES/TUBES: Right IJ CVL 3//22>>>  DISCUSSION: 55 year old female w/ recurrent DKA d/t poorly controlled DM. Just d/c'd 3/8 for same dx. Seen by PCP on 3/20 w/ nausea and vomiting. Now being admitted again w/ DKA, +/- sepsis (seems less likely). Will admit w/ DKA protocol, have added 1 liter bicarb for NAG component of acidosis, she is profoundly acidotic and hyperkalemic but this should improve w/ volume and insulin. Additional issues are hypotension and coumadin induced coagulopathy. She probably needs to be discharged to an SNF after this as clearly she is not doing well at home.   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 Likely multi-factorial: Hypovolemic shock; in setting of DKA; may be a component of  cardiogenic due to acidosis and CO suppression. Sepsis also on ddx  cad P:  Coumadin to be followed by pharmacy Continue gentle volume  Trend lactate - not yet clearing.   Cont IVFs Trend CVP   RENAL A:  Mixed AG/NAG metabolic acidosis in setting of lactic acidosis, lactic acidosis/ketoacidosis w/ bicarb loss from vomiting (likely) Profound hypokalemia  hypoPHOS AKI P:   Aggressive volume resuscitation Hold antihypertensives Strict I&O Trend lactate  Follow gap  Recheck chem now   GASTROINTESTINAL A:   NPO at risk for aspiration  P:   NPO Ct abd/pelvis r/o intra-abd source of SIRS, lactic acidosis, DKA   HEMATOLOGIC A:   COUMADIN induced coagulopathy Anemia of chronic disease  P:  Trend cbc Transfuse per ICU protocol  Will give Vit K and 1 unit FFP for steadily RISING INR  follow INR closely   INFECTIOUS A:  R/o sepsis  P:   Culture blood and urine  Empiric abx as above    ENDOCRINE A:   DKA w/ profound hyperglycemia  P:   Insulin gtt per DKA protocol   NEUROLOGIC A:   Acute Encephalopathy  P:   RASS goal: 0 Hold any sedation  CT head with ongoing AMS and profound coagulopathy    FAMILY  - Updates: sister updated via phone per RN  - Inter-disciplinary family meet or Palliative Care meeting due by: 4/1   Dirk Dress, NP 02/09/2016  10:42 AM Pager: [phone] or [phone]  Attending Note:  55 year old female with DKA and coagulopathy (coumadin due to valve).  INR is 12, LA is 7 and patient's mental status is improving marginally.  Lungs clear on exam.  Will order a head CT, abdominal/pelvic CT, give vit K, adjust electrolytes.  Continue hydration and DKA protocol in the meantime.  With elevated lactate, concern is that patient may have an abdominal catastrophe thus CT of the abdomen.  Will hold in the ICU for observation overnight.  The patient is critically ill with multiple organ systems failure and requires high complexity  decision making for assessment and support, frequent evaluation and titration of therapies, application of advanced monitoring technologies and extensive interpretation of multiple databases.   Critical Care Time devoted to patient care services described in this note is  35  Minutes. This time reflects time of care of this signee Dr Koren Bound. This critical care time does not reflect procedure time, or teaching time or supervisory time of PA/NP/Med student/Med Resident etc but could involve care discussion time.  Alyson Reedy, M.D. [facility] Pulmonary/Critical Care Medicine. Pager: [phone]. After hours pager: [phone].

## 2016-02-09 NOTE — Progress Notes (Signed)
eLink Physician-Brief Progress Note Patient Name: Madison [name] DOB: 12-11-1960 MRN: [id number]   Date of Service  02/09/2016  HPI/Events of Note  Bedside nurse reports most recent anion gap now 11. This was at noon today. Potassium 3.5. Sodium 148. Insulin infusion has been decreased.Patient currently on D5 half-normal saline at 100 mL per hour.  eICU Interventions  1. Repeat BMP at midnight 2. Increase D5 half-normal to 150 mL per hour 3. Consider transitioning off insulin infusion if electrolytes & anion gap remained stable at midnight     Intervention Category Intermediate Interventions: Hyperglycemia - evaluation and treatment  [name] 02/09/2016, 7:14 PM

## 2016-02-09 NOTE — Care Management Note (Signed)
Case Management Note  Patient Details  Name: Madison [name] MRN: [id number] Date of Birth: [birth year]  Subjective/Objective:           Adm w dka   Action/Plan: lives w fam, act w thn, pcp dr Nature conservation officer   Expected Discharge Date:                  Expected Discharge Plan:  Home w Home Health Services  In-House Referral:     Discharge planning Services     Post Acute Care Choice:    Choice offered to:     DME Arranged:    DME Agency:     HH Arranged:  Disease Management HH Agency:  [facility], quantity  Status of Service:     Medicare Important Message Given:    Date Medicare IM Given:    Medicare IM give by:    Date Additional Medicare IM Given:    Additional Medicare Important Message give by:     If discussed at Long Length of Stay Meetings, dates discussed:    Additional Comments: ur review done  Hanley Hays, RN 02/09/2016, 8:54 AM

## 2016-02-10 DIAGNOSIS — I10 Essential (primary) hypertension: Secondary | ICD-10-CM

## 2016-02-10 DIAGNOSIS — E1165 Type 2 diabetes mellitus with hyperglycemia: Secondary | ICD-10-CM

## 2016-02-10 LAB — CBC
HCT: 22.2 % — ABNORMAL LOW (ref 36.0–46.0)
HEMOGLOBIN: 7 g/dL — AB (ref 12.0–15.0)
MCH: 24.3 pg — AB (ref 26.0–34.0)
MCHC: 31.5 g/dL (ref 30.0–36.0)
MCV: 77.1 fL — ABNORMAL LOW (ref 78.0–100.0)
Platelets: 212 10*3/uL (ref 150–400)
RBC: 2.88 MIL/uL — ABNORMAL LOW (ref 3.87–5.11)
RDW: 18.4 % — ABNORMAL HIGH (ref 11.5–15.5)
WBC: 13.4 10*3/uL — ABNORMAL HIGH (ref 4.0–10.5)

## 2016-02-10 LAB — GLUCOSE, CAPILLARY
GLUCOSE-CAPILLARY: 211 mg/dL — AB (ref 65–99)
GLUCOSE-CAPILLARY: 192 mg/dL — AB (ref 65–99)
GLUCOSE-CAPILLARY: 122 mg/dL — AB (ref 65–99)
GLUCOSE-CAPILLARY: 137 mg/dL — AB (ref 65–99)
GLUCOSE-CAPILLARY: 130 mg/dL — AB (ref 65–99)
GLUCOSE-CAPILLARY: 118 mg/dL — AB (ref 65–99)
Glucose-Capillary: 120 mg/dL — ABNORMAL HIGH (ref 65–99)
Glucose-Capillary: 191 mg/dL — ABNORMAL HIGH (ref 65–99)
Glucose-Capillary: 105 mg/dL — ABNORMAL HIGH (ref 65–99)
Glucose-Capillary: 149 mg/dL — ABNORMAL HIGH (ref 65–99)
Glucose-Capillary: 132 mg/dL — ABNORMAL HIGH (ref 65–99)
Glucose-Capillary: 266 mg/dL — ABNORMAL HIGH (ref 65–99)
Glucose-Capillary: 166 mg/dL — ABNORMAL HIGH (ref 65–99)
Glucose-Capillary: 154 mg/dL — ABNORMAL HIGH (ref 65–99)
Glucose-Capillary: 169 mg/dL — ABNORMAL HIGH (ref 65–99)
Glucose-Capillary: 210 mg/dL — ABNORMAL HIGH (ref 65–99)
Glucose-Capillary: 241 mg/dL — ABNORMAL HIGH (ref 65–99)
Glucose-Capillary: 248 mg/dL — ABNORMAL HIGH (ref 65–99)
Glucose-Capillary: 84 mg/dL (ref 65–99)

## 2016-02-10 LAB — RENAL FUNCTION PANEL
ANION GAP: 9 (ref 5–15)
Albumin: 2.1 g/dL — ABNORMAL LOW (ref 3.5–5.0)
BUN: <5 mg/dL — ABNORMAL LOW (ref 6–20)
CO2: 18 mmol/L — AB (ref 22–32)
Calcium: 7.2 mg/dL — ABNORMAL LOW (ref 8.9–10.3)
Chloride: 116 mmol/L — ABNORMAL HIGH (ref 101–111)
Creatinine, Ser: 0.83 mg/dL (ref 0.44–1.00)
GFR calc Af Amer: >60 mL/min (ref >60)
GFR calc non Af Amer: >60 mL/min (ref >60)
GLUCOSE: 115 mg/dL — AB (ref 65–99)
POTASSIUM: 3.3 mmol/L — AB (ref 3.5–5.1)
Phosphorus: 2 mg/dL — ABNORMAL LOW (ref 2.5–4.6)
SODIUM: 143 mmol/L (ref 135–145)

## 2016-02-10 LAB — URINE CULTURE: Culture: NO GROWTH

## 2016-02-10 LAB — PREPARE FRESH FROZEN PLASMA: Unit division: 0

## 2016-02-10 LAB — PROTIME-INR
INR: 1.97 — AB (ref 0.00–1.49)
PROTHROMBIN TIME: 22.3 s — AB (ref 11.6–15.2)
Prothrombin Time: 89.4 seconds — ABNORMAL HIGH (ref 11.6–15.2)

## 2016-02-10 LAB — POCT I-STAT 3, ART BLOOD GAS (G3+)
Acid-base deficit: <30 mmol/L — ABNORMAL HIGH (ref 0.0–2.0)
Bicarbonate: 2.3 meq/L — ABNORMAL LOW (ref 20.0–24.0)
O2 Saturation: 70 %
TCO2: <5 mmol/L (ref 0–100)
pCO2 arterial: 17.6 mmHg — CL (ref 35.0–45.0)
pH, Arterial: 6.73 — CL (ref 7.350–7.450)
pO2, Arterial: 72 mmHg — ABNORMAL LOW (ref 80.0–100.0)

## 2016-02-10 LAB — BASIC METABOLIC PANEL
Anion gap: 9 (ref 5–15)
BUN: 5 mg/dL — AB (ref 6–20)
CALCIUM: 7.2 mg/dL — AB (ref 8.9–10.3)
CHLORIDE: 118 mmol/L — AB (ref 101–111)
CO2: 19 mmol/L — ABNORMAL LOW (ref 22–32)
CREATININE: 0.85 mg/dL (ref 0.44–1.00)
GFR calc Af Amer: >60 mL/min (ref >60)
GFR calc non Af Amer: >60 mL/min (ref >60)
Glucose, Bld: 125 mg/dL — ABNORMAL HIGH (ref 65–99)
Potassium: 3 mmol/L — ABNORMAL LOW (ref 3.5–5.1)
SODIUM: 146 mmol/L — AB (ref 135–145)

## 2016-02-10 LAB — PROCALCITONIN: PROCALCITONIN: 5.27 ng/mL

## 2016-02-10 LAB — MAGNESIUM: Magnesium: 1.4 mg/dL — ABNORMAL LOW (ref 1.7–2.4)

## 2016-02-10 LAB — HEPARIN LEVEL (UNFRACTIONATED): HEPARIN UNFRACTIONATED: 0.24 [IU]/mL — AB (ref 0.30–0.70)

## 2016-02-10 MED ORDER — POTASSIUM CHLORIDE 10 MEQ/50ML IV SOLN
10.0000 meq | INTRAVENOUS | Status: AC
  Administered 2016-02-10 (×4): 10 meq via INTRAVENOUS
  Filled 2016-02-10 (×4): qty 50

## 2016-02-10 MED ORDER — INSULIN DETEMIR 100 UNIT/ML ~~LOC~~ SOLN
20.0000 [IU] | Freq: Every day | SUBCUTANEOUS | Status: DC
  Administered 2016-02-10: 20 [IU] via SUBCUTANEOUS
  Filled 2016-02-10 (×2): qty 0.2

## 2016-02-10 MED ORDER — CETYLPYRIDINIUM CHLORIDE 0.05 % MT LIQD: 7.0000 mL | Freq: Two times a day (BID) | OROMUCOSAL | Status: DC

## 2016-02-10 MED ORDER — VANCOMYCIN HCL IN DEXTROSE 750-5 MG/150ML-% IV SOLN
750.0000 mg | Freq: Two times a day (BID) | INTRAVENOUS | Status: DC
  Administered 2016-02-10 – 2016-02-11 (×4): 750 mg via INTRAVENOUS
  Filled 2016-02-10 (×5): qty 150

## 2016-02-10 MED ORDER — MAGNESIUM SULFATE 50 % IJ SOLN
3.0000 g | Freq: Once | INTRAMUSCULAR | Status: AC
  Administered 2016-02-10: 3 g via INTRAVENOUS
  Filled 2016-02-10: qty 6

## 2016-02-10 MED ORDER — CHLORHEXIDINE GLUCONATE 0.12 % MT SOLN
15.0000 mL | Freq: Two times a day (BID) | OROMUCOSAL | Status: DC
  Administered 2016-02-10 – 2016-02-11 (×2): 15 mL via OROMUCOSAL
  Filled 2016-02-10 (×2): qty 15

## 2016-02-10 MED ORDER — INSULIN ASPART 100 UNIT/ML ~~LOC~~ SOLN
0.0000 [IU] | Freq: Three times a day (TID) | SUBCUTANEOUS | Status: DC
  Administered 2016-02-11: 5 [IU] via SUBCUTANEOUS
  Administered 2016-02-11: 2 [IU] via SUBCUTANEOUS
  Administered 2016-02-12: 5 [IU] via SUBCUTANEOUS
  Administered 2016-02-12 – 2016-02-13 (×3): 3 [IU] via SUBCUTANEOUS
  Administered 2016-02-13: 9 [IU] via SUBCUTANEOUS
  Administered 2016-02-14: 3 [IU] via SUBCUTANEOUS
  Administered 2016-02-14: 2 [IU] via SUBCUTANEOUS

## 2016-02-10 MED ORDER — INSULIN ASPART 100 UNIT/ML ~~LOC~~ SOLN
4.0000 [IU] | Freq: Three times a day (TID) | SUBCUTANEOUS | Status: DC
  Administered 2016-02-11 – 2016-02-12 (×4): 4 [IU] via SUBCUTANEOUS

## 2016-02-10 MED ORDER — CETYLPYRIDINIUM CHLORIDE 0.05 % MT LIQD
7.0000 mL | Freq: Two times a day (BID) | OROMUCOSAL | Status: DC
  Administered 2016-02-10 – 2016-02-11 (×4): 7 mL via OROMUCOSAL

## 2016-02-10 MED ORDER — HEPARIN (PORCINE) IN NACL 100-0.45 UNIT/ML-% IJ SOLN
1050.0000 [IU]/h | INTRAMUSCULAR | Status: DC
  Administered 2016-02-10: 900 [IU]/h via INTRAVENOUS
  Administered 2016-02-11 – 2016-02-12 (×2): 1050 [IU]/h via INTRAVENOUS
  Filled 2016-02-10 (×3): qty 250

## 2016-02-10 NOTE — Progress Notes (Signed)
ANTICOAGULATION CONSULT NOTE - Initial Consult  Pharmacy Consult for Heparin Indication: mechanical valve MVR  Allergies  Allergen Reactions  . Other Other (See Comments)    Can burn skin if left on too long.TAPE  . Celebrex [Celecoxib] Rash  . Detrol [Tolterodine] Hives    Patient Measurements: Height: 5\' 4"  (162.6 cm) Weight: 157 lb 6.5 oz (71.4 kg) IBW/kg (Calculated) : 54.7  Heparin dosing weight = 64 kg   Vital Signs: Temp: 98.1 F (36.7 C) (03/24 1900) Temp Source: Oral (03/24 1900) BP: 132/65 mmHg (03/24 2000) Pulse Rate: 74 (03/24 2000)  Labs:  Recent Labs  02/08/16 1337  02/09/16 0400 02/09/16 1243 02/10/16 0010 02/10/16 0600 02/10/16 2026  HGB 7.8*  --  8.0*  --   --  7.0*  --   HCT 28.9*  --  25.5*  --   --  22.2*  --   PLT 516*  --  328  --   --  212  --   LABPROT 66.6*  --  89.4* 47.4*  --  22.3*  --   INR 8.44*  --  >10.00* 5.37*  --  1.97*  --   HEPARINUNFRC  --   --   --   --   --   --  0.24*  CREATININE 2.42*  < > 1.54* 1.11* 0.85 0.83  --   < > = values in this interval not displayed.  Estimated Creatinine Clearance: 74.2 mL/min (by C-G formula based on Cr of 0.83).   Medical History: Past Medical History  Diagnosis Date  . CAD (coronary artery disease)   . Obesity   . Hypercholesteremia   . HTN (hypertension)   . Dyslipidemia   . Aortic stenosis   . Thrombophlebitis   . Heart murmur   . Myocardial infarction (HCC) 12/2014  . Pneumonia 11/2014; 12/2014  . GERD (gastroesophageal reflux disease)   . Stroke syndrome [facility]) 1995; 2001    "when my son was born; problems w/speech and L hand since then" (01/19/2015)  . Anxiety   . Depression   . Allergy   . CHF (congestive heart failure) (HCC)   . Rheumatoid arthritis(714.0)     "hands" (07/01/2015)  . Diabetes (HCC) dx'd 1982    "went straight to insulin"      Assessment: 55yof admitted with N/V, altered MS from DKA.  She is on warfarin PTA for mechanical MVR.  INR elevated 7.56 rose  to 12 and reversed with IV vitamin K 5mg .  INR now 1.97.  CBC stable.  Still lethargic and not taking po meds.  Will start IV heparin for now.    Heparin level 0.24, subtherapeutic on 900 units/hr.   Goal of Therapy:  INR 2.5-3.5 Heparin level 0.3-0.7 units/ml Monitor platelets by anticoagulation protocol: Yes   Plan:  Increase Heparin rate to 1050 units/hr F/u AM labs  08/31/2015, PharmD, BCPS  Clinical Pharmacist  Pager: [phone]   02/10/2016 9:09 PM

## 2016-02-10 NOTE — Progress Notes (Signed)
eLink Physician-Brief Progress Note Patient Name: Madison [name] DOB: 1961-06-03 MRN: [id number]   Date of Service  02/10/2016  HPI/Events of Note  K+ = 3.0 and Creatinine = 0.85.  eICU Interventions  Will replete K+.      Intervention Category Intermediate Interventions: Electrolyte abnormality - evaluation and management  Sommer,Steven Eugene 02/10/2016, 2:03 AM

## 2016-02-10 NOTE — Progress Notes (Signed)
ANTICOAGULATION CONSULT NOTE - Initial Consult  Pharmacy Consult for Heparin Indication: mechanical valve MVR  Allergies  Allergen Reactions  . Other Other (See Comments)    Can burn skin if left on too long.TAPE  . Celebrex [Celecoxib] Rash  . Detrol [Tolterodine] Hives    Patient Measurements: Height: 5\' 4"  (162.6 cm) Weight: 157 lb 6.5 oz (71.4 kg) IBW/kg (Calculated) : 54.7   Vital Signs: Temp: 97.5 F (36.4 C) (03/24 0800) Temp Source: Oral (03/24 0800) BP: 123/61 mmHg (03/24 1100) Pulse Rate: 86 (03/24 1100)  Labs:  Recent Labs  02/08/16 1337  02/09/16 0400 02/09/16 1243 02/10/16 0010 02/10/16 0600  HGB 7.8*  --  8.0*  --   --  7.0*  HCT 28.9*  --  25.5*  --   --  22.2*  PLT 516*  --  328  --   --  212  LABPROT 66.6*  --  89.4* 47.4*  --  22.3*  INR 8.44*  --  12.48* 5.37*  --  1.97*  CREATININE 2.42*  < > 1.54* 1.11* 0.85 0.83  < > = values in this interval not displayed.  Estimated Creatinine Clearance: 74.2 mL/min (by C-G formula based on Cr of 0.83).   Medical History: Past Medical History  Diagnosis Date  . CAD (coronary artery disease)   . Obesity   . Hypercholesteremia   . HTN (hypertension)   . Dyslipidemia   . Aortic stenosis   . Thrombophlebitis   . Heart murmur   . Myocardial infarction (HCC) 12/2014  . Pneumonia 11/2014; 12/2014  . GERD (gastroesophageal reflux disease)   . 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 Anxiety   . Depression   . Allergy   . CHF (congestive heart failure) (HCC)   . Rheumatoid arthritis(714.0)     "hands" (07/01/2015)  . Diabetes (HCC) dx'd 1982    "went straight to insulin"      Assessment: 55yof admitted with N/V, altered MS from DKA.  She is on warfarin PTA for mechanical MVR.  INR elevated 7.56 rose to 12 and reversed with IV vitamin K 5mg .  INR now 1.97.  CBC stable.  Still lethargic and not taking po meds.  Will start IV  heparin for now.     Goal of Therapy:  INR 2.5-3.5 Heparin level 0.3-0.7 units/ml Monitor platelets by anticoagulation protocol: Yes   Plan:  No bolus Heparin drip 9000 units/hr HL 6hr after srip started Daily Protime, CBC, HL F/u restart warfarin   08/31/2015 Pharm.D. CPP, BCPS Clinical Pharmacist [phone] 02/10/2016 2:00 PM

## 2016-02-10 NOTE — Progress Notes (Signed)
Pharmacy Antibiotic Note  Madison [name] is a 56 y.o. female admitted on 02/08/2016 with sepsis.  Pharmacy has been consulted for vancomycin and zosyn dosing.afebrile , WBC elevated 22> improving.  On admit  Scr was above baseline at 2.74> now back to BL 0.8. Low Mag replace Low K MD replacing Warfarin PTA INR e;evated up to 12 > vitk 5mg  IV > INR 1.97 - h/h low watch - will discuss timing of IV AC with CCm   Plan: - change Vancomycin  750mg  IV Q 12 - Zosyn 3.375gm IV Q8H EI Check VT this weekend  Height: 5\' 4"  (162.6 cm) Weight: 157 lb 6.5 oz (71.4 kg) IBW/kg (Calculated) : 54.7  Temp (24hrs), Avg:98.2 F (36.8 C), Min:97 F (36.1 C), Max:99.8 F (37.7 C)   Recent Labs Lab 02/06/16 1030 02/08/16 1141 02/08/16 1149 02/08/16 1337 02/08/16 1338  02/09/16 0013 02/09/16 0400 02/09/16 1243 02/09/16 1245 02/10/16 0010 02/10/16 0600  WBC 12.7* 26.2*  --  27.0*  --   --   --  16.2*  --   --   --  13.4*  CREATININE 0.98 2.74*  --  2.42*  --   < > 1.75* 1.54* 1.11*  --  0.85 0.83  LATICACIDVEN  --   --  9.33*  --  7.7*  --   --  7.5*  --  1.4  --   --   < > = values in this interval not displayed.  Estimated Creatinine Clearance: 74.2 mL/min (by C-G formula based on Cr of 0.83).    Allergies  Allergen Reactions  . Other Other (See Comments)    Can burn skin if left on too long.TAPE  . Celebrex [Celecoxib] Rash  . Detrol [Tolterodine] Hives    Antimicrobials this admission: Vanc 3/22>> Zosyn 3/22>>  Dose adjustments this admission: Increase vancomycin q24> q12  Microbiology results: Pending  02/11/16 Pharm.D. CPP, BCPS Clinical Pharmacist [phone] 02/10/2016 11:57 AM

## 2016-02-10 NOTE — Consult Note (Addendum)
   [facility] CM Inpatient Consult   02/10/2016  Madison [name] 05-06-1961 [id number] Alert by inpatient RNCM that this patient had admitted as a [facility] patient with [facility] Medicare on 02/09/16.  Chart review reveals the patient was being followed by [facility] Manager for transition of care calls but the nurse was having difficulty maintaining contact with the patient.  Came by to see the patient but the patient was receiving nurse care in stepdown at this time.  Will follow up at a more appropriate time to check for restart of services.  For questions, please contact: Charlesetta Shanks, RN BSN CCM [facility]  [phone] business mobile phone Toll free office [phone]

## 2016-02-10 NOTE — Progress Notes (Signed)
PULMONARY / CRITICAL CARE MEDICINE   Name: Madison [name] MRN: [id number] DOB: 30-Oct-1961    ADMISSION DATE:  02/08/2016 CONSULTATION DATE:  3/22 REFERRING MD:  Rubin Payor   CHIEF COMPLAINT:   Acute encephalopathy and DKA  HISTORY OF PRESENT ILLNESS:   This is a 55 year old female w/ known h/o severe and difficult to control DM. Last seen by PCP on 3/20 w/ report of nausea and vomiting. Poor PO intake. Glucose at that time in office was 47; but had been up to 311 the evening before. She was treated w/ zofran and sent home. Little hx is available from that time on; other than EMS was called d/t pt found w/ altered mental status. On arrival CBG to high to measure. On ED arrival w/ bicarb < 7, Ph 6.9. Unresponsive, w/ increased RR. PCCM was asked to admit.    SUBJECTIVE:  Agitated this am per RN, received 1 dose ativan.  Groans, nods appropriately intermittently.  CVP=6-7, Lactate remains elevated, borderline BP.   VITAL SIGNS: BP 123/61 mmHg  Pulse 86  Temp(Src) 97.5 F (36.4 C) (Oral)  Resp 29  Ht 5\' 4"  (1.626 m)  Wt 71.4 kg (157 lb 6.5 oz)  BMI 27.01 kg/m2  SpO2 98% 100% HEMODYNAMICS: CVP:  [7 mmHg-10 mmHg] 10 mmHg  VENTILATOR SETTINGS:    INTAKE / OUTPUT: I/O last 3 completed shifts: In: 6177.3 [I.V.:4678.3; Blood:314; IV Piggyback:1185] Out: 3010 [Urine:3010]  PHYSICAL EXAMINATION: General:  Chronically ill appearing white female. Moans, nods intermittently  Neuro:  Opens eyes, moans only, no focal def  HEENT:  Mucous membranes are dry, neck vein is flat  Cardiovascular:  Rrr, + SEM + metallic click Lungs:  resps even non labored on [place], diminished bases otherwise clear   Abdomen:  Soft, + bowel sounds  Musculoskeletal:  Intact, no focal def  Skin:  Warm, dry and intact   LABS:  BMET  Recent Labs Lab 02/09/16 1243 02/10/16 0010 02/10/16 0600  NA 148* 146* 143  K 3.5 3.0* 3.3*  CL 118* 118* 116*  CO2 19* 19* 18*  BUN 9 5* <5*  CREATININE 1.11* 0.85 0.83   GLUCOSE 176* 125* 115*    Electrolytes  Recent Labs Lab 02/08/16 1337  02/09/16 0400 02/09/16 1243 02/09/16 1421 02/10/16 0010 02/10/16 0600  CALCIUM 7.7*  < > 7.7* 7.4*  --  7.2* 7.2*  MG 2.5*  --  1.5*  --   --   --  1.4*  PHOS 11.5*  --  <1.0*  --  3.3  --  2.0*  < > = values in this interval not displayed.  CBC  Recent Labs Lab 02/08/16 1337 02/09/16 0400 02/10/16 0600  WBC 27.0* 16.2* 13.4*  HGB 7.8* 8.0* 7.0*  HCT 28.9* 25.5* 22.2*  PLT 516* 328 212    Coag's  Recent Labs Lab 02/09/16 0400 02/09/16 1243 02/10/16 0600  INR 12.48* 5.37* 1.97*    Sepsis Markers  Recent Labs Lab 02/08/16 1337 02/08/16 1338 02/09/16 0400 02/09/16 1243 02/09/16 1245  LATICACIDVEN  --  7.7* 7.5*  --  1.4  PROCALCITON 4.82  --   --  9.17  --     ABG  Recent Labs Lab 02/08/16 1153  PHART 6.730*  PCO2ART 17.6*  PO2ART 72*    Liver Enzymes  Recent Labs Lab 02/08/16 1141 02/10/16 0600  AST 22  --   ALT 15  --   ALKPHOS 114  --   BILITOT 0.9  --  ALBUMIN 3.2* 2.1*    Cardiac Enzymes No results for input(s): TROPONINI, PROBNP in the last 168 hours.  Glucose  Recent Labs Lab 02/10/16 0407 02/10/16 0506 02/10/16 0601 02/10/16 0706 02/10/16 0756 02/10/16 0909  GLUCAP 149* 122* 105* 132* 166* 266*    Imaging Ct Abdomen Pelvis Wo Contrast  02/09/2016  CLINICAL DATA:  Vomiting and lactic acidosis. Diabetic. On Coumadin. EXAM: CT ABDOMEN AND PELVIS WITHOUT CONTRAST TECHNIQUE: Multidetector CT imaging of the abdomen and pelvis was performed following the standard protocol without IV contrast. COMPARISON:  KUB 11/23/2015 FINDINGS: Lower chest: Minimal pleural effusion bilaterally with minimal bibasilar atelectasis. Negative for pneumonia. Aortic valve replacement. Hepatobiliary: Unenhanced images of the liver show normal liver size. No focal liver lesion. Small layering calcified gallstones without gallbladder wall thickening. Bile ducts nondilated.  Pancreas: Negative Spleen: Negative Adrenals/Urinary Tract: No renal mass or obstruction on unenhanced imaging. Extensive vascular calcification of the renal arteries. Urinary bladder is normal with a Foley catheter. Stomach/Bowel: Negative for bowel obstruction. No bowel edema or mass. Negative for diverticulitis. Appendix not visualized. Vascular/Lymphatic: Extensive vascular calcification due to diabetes. No aortic aneurysm. Reproductive: Calcified uterine fibroids. The largest fibroid is posterior fundus measuring 26 mm. No adnexal mass. Other: No free fluid.  Small umbilical hernia. Musculoskeletal: Negative IMPRESSION: No acute abnormality in the abdomen Cholelithiasis. Electronically Signed   By: Marlan Palau M.D.   On: 02/09/2016 14:13   Ct Head Wo Contrast  02/09/2016  CLINICAL DATA:  55 year old female with confusion, vomiting, lactic acidosis. Initial encounter. EXAM: CT HEAD WITHOUT CONTRAST TECHNIQUE: Contiguous axial images were obtained from the base of the skull through the vertex without intravenous contrast. COMPARISON:  Head CT without contrast 07/30/2015 and earlier FINDINGS: Small volume retained secretions in the nasopharynx. Mild ethmoid sinus mucosal thickening is new. Mastoids are clear. Tympanic cavities are clear. No acute osseous abnormality identified. No acute orbit soft tissue findings. Broad-based posterior scalp soft tissue swelling measuring up to 5 mm in thickness, most resembling hematoma (series 3, image 71). Underlying calvarium appears intact. Calcified atherosclerosis at the skull base. Encephalomalacia related to large chronic right MCA territory infarcts. Chronic left greater than right PCA and superior left MCA chronic infarcts small chronic left greater than right cerebellar infarcts. Stable cerebral volume. No ventriculomegaly. No midline shift, mass effect, or evidence of intracranial mass lesion. No acute intracranial hemorrhage identified. No cortically based  acute infarct identified. No suspicious intracranial vascular hyperdensity. IMPRESSION: 1. Stable non contrast CT appearance of the brain. Advanced chronic ischemic disease with widespread encephalomalacia. 2. Broad-based posterior scalp soft tissue thickening most resembling scalp hematoma. No underlying fracture. Electronically Signed   By: Odessa Fleming M.D.   On: 02/09/2016 14:11     STUDIES:    CULTURES: BCX2 3/22>>> UC 3/22>>>  ANTIBIOTICS: Zosyn 3/22>>> vanc 3/22>>>  SIGNIFICANT EVENTS:   LINES/TUBES: Right IJ CVL 3//22>>>  I reviewed CXR myself, IJ in place.  DISCUSSION: 55 year old female w/ recurrent DKA d/t poorly controlled DM. Just d/c'd 3/8 for same dx. Seen by PCP on 3/20 w/ nausea and vomiting. Now being admitted again w/ DKA, +/- sepsis (seems less likely). Will admit w/ DKA protocol, have added 1 liter bicarb for NAG component of acidosis, she is profoundly acidotic and hyperkalemic but this should improve w/ volume and insulin. Additional issues are hypotension and coumadin induced coagulopathy. She probably needs to be discharged to an SNF after this as clearly she is not doing well at home.  ASSESSMENT / PLAN:  PULMONARY A: At risk for respiratory failure; currently w/ respiratory compensation to metabolic acidosis  Aspiration risk  P:   Pulse ox Aspiration precautions  SLP per RN. 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 Likely multi-factorial: Hypovolemic shock; in setting of DKA; may be a component of cardiogenic due to acidosis and CO suppression. Sepsis also on ddx  cad P:  Heparin per pharmacy. Trend lactate - not yet clearing.   Cont IVFs Trend CVP   RENAL A:  Mixed AG/NAG metabolic acidosis in setting of lactic acidosis, lactic acidosis/ketoacidosis w/ bicarb loss from vomiting (likely) Profound hypokalemia  hypoPHOS AKI P:   Aggressive volume resuscitation Hold  antihypertensives Strict I&O Replace electrolytes as indicated.  Recheck chem now   GASTROINTESTINAL A:   NPO at risk for aspiration  P:   Diabetic diet. Ct abd/pelvis r/o intra-abd source of SIRS, lactic acidosis, DKA   HEMATOLOGIC A:   COUMADIN induced coagulopathy Anemia of chronic disease  P:  Trend cbc Transfuse per ICU protocol  Heparin per pharmacy.  INFECTIOUS A:   R/o sepsis  P:   Culture blood and urine  Empiric abx as above   ENDOCRINE A:   DKA w/ profound hyperglycemia  P:   Insulin gtt per DKA protocol  Diabetic coordinator consult.  NEUROLOGIC A:   Acute Encephalopathy  P:   RASS goal: 0 Hold any sedation   FAMILY  - Updates: sister updated via phone per RN  - Inter-disciplinary family meet or Palliative Care meeting due by: 4/1  Discussed with bedside RN and TRH-MD.  Transfer to SDU and to [facility] service with PCCM off 3/25.  Alyson Reedy, M.D. [facility] Pulmonary/Critical Care Medicine. Pager: [phone]. After hours pager: [phone].

## 2016-02-10 NOTE — Progress Notes (Signed)
Inpatient Diabetes Program Recommendations  AACE/ADA: New Consensus Statement on Inpatient Glycemic Control (2015)  Target Ranges:  Prepandial:   less than 140 mg/dL      Peak postprandial:   less than 180 mg/dL (1-2 hours)      Critically ill patients:  140 - 180 mg/dL   Review of Glycemic Control  Results for NALINI, ALCARAZ (MRN [id number]) as of 02/10/2016 13:13  Ref. Range 02/10/2016 09:09 02/10/2016 10:23 02/10/2016 11:08 02/10/2016 12:04  Glucose-Capillary Latest Ref Range: 65-99 mg/dL 266 (H) 210 (H) 169 (H) 154 (H)  Results for STEPHANE, JUNKINS (MRN [id number]) as of 02/10/2016 13:13  Ref. Range 02/10/2016 06:00  Sodium Latest Ref Range: 135-145 mmol/L 143  Potassium Latest Ref Range: 3.5-5.1 mmol/L 3.3 (L)  Chloride Latest Ref Range: 101-111 mmol/L 116 (H)  CO2 Latest Ref Range: 22-32 mmol/L 18 (L)  BUN Latest Ref Range: 6-20 mg/dL <5 (L)  Creatinine Latest Ref Range: 0.44-1.00 mg/dL 0.83  Calcium Latest Ref Range: 8.9-10.3 mg/dL 7.2 (L)  EGFR (Non-African Amer.) Latest Ref Range: >60 mL/min >60  EGFR (African American) Latest Ref Range: >60 mL/min >60  Glucose Latest Ref Range: 65-99 mg/dL 115 (H)  Anion gap Latest Ref Range: 5-15  9   Ready to transition to basal-bolus insulin.  Recommendations: Levemir 20 units Q24H. Give Levemir 1-2 hours prior to discontinuation of GlucoStabilizer. Novolog sensitive tidwc +4 units tidwc for meal coverage insulin. CHO mod med diet.  Continue to follow.Thank you. Lorenda Peck, RD, LDN, CDE Inpatient Diabetes Coordinator [phone]

## 2016-02-10 NOTE — Evaluation (Signed)
Physical Therapy Evaluation Patient Details Name: Madison [name] MRN: [id number] DOB: 02-Jan-1961 Today's Date: 02/10/2016   History of Present Illness  This is a 55 year old female w/ known h/o severe and difficult to control DM. Last seen by PCP on 3/20 w/ report of nausea and vomiting. Poor PO intake. Glucose at that time in office was 47; but had been up to 311 the evening before. She was treated w/ zofran and sent home. Little hx is available from that time on; other than EMS was called d/t pt found w/ altered mental status. On arrival CBG to high to measure. On ED arrival w/ bicarb < 7, Ph 6.9. Unresponsive, w/ increased RR. PCCM was asked to admit.   Clinical Impression  Pt admitted with above diagnosis. Pt currently with functional limitations due to the deficits listed below (see PT Problem List). Pt will need SNF for therapy as she has developed weakness and tone affecting her ability to mobilize as well as poor cognition and poor safety awareness. Will follow acutely.   Pt will benefit from skilled PT to increase their independence and safety with mobility to allow discharge to the venue listed below.      Follow Up Recommendations SNF;Supervision/Assistance - 24 hour    Equipment Recommendations  Other (comment) (TBA)    Recommendations for Other Services OT consult     Precautions / Restrictions Precautions Precautions: Fall Restrictions Weight Bearing Restrictions: No      Mobility  Bed Mobility Overal bed mobility: Needs Assistance;+2 for physical assistance Bed Mobility: Rolling Rolling: Mod assist;+2 for physical assistance         General bed mobility comments: Pt resisting to roll or to sit up.  Pushing posteriorly.    Transfers                 General transfer comment: Unable to sit EOB due to posterior lean.  Pt resists movement.  Ambulation/Gait                Stairs            Wheelchair Mobility    Modified Rankin (Stroke  Patients Only)       Balance                                             Pertinent Vitals/Pain Pain Assessment: No/denies pain  VSS    Home Living Family/patient expects to be discharged to:: Private residence Living Arrangements: Children (roomate) Available Help at Discharge: Other (Comment) (aide 2 hours day, 6 days week) Type of Home: House Home Access: Stairs to enter Entrance Stairs-Rails: Right Entrance Stairs-Number of Steps: 3 Home Layout: One level Home Equipment: Grab bars - tub/shower;Walker - 2 wheels;Cane - single point;Grab bars - toilet;Bedside commode Additional Comments: All history above obtained from previous admission.  Pt unable to clearly speak to assess pts home status.    Prior Function Level of Independence: Needs assistance   Gait / Transfers Assistance Needed: ambulatory without use of device inside home  ADL's / Homemaking Assistance Needed: aide assist with housework  Comments: Reports she has an aide assist with IADLs, driving to appts etc.   Again information above obtained from previous admission.      Hand Dominance   Dominant Hand: Right    Extremity/Trunk Assessment   Upper Extremity Assessment: Defer to OT  evaluation           Lower Extremity Assessment: LLE deficits/detail   LLE Deficits / Details: residual weakness from previous CVA     Communication   Communication: Other (comment) (difficulty speaking coherently.)  Cognition Arousal/Alertness: Awake/alert Behavior During Therapy: Anxious;Impulsive Overall Cognitive Status: Impaired/Different from baseline Area of Impairment: Safety/judgement;Awareness;Problem solving     Memory: Decreased short-term memory   Safety/Judgement: Decreased awareness of safety;Decreased awareness of deficits   Problem Solving: Slow processing;Difficulty sequencing;Requires verbal cues;Requires tactile cues;Decreased initiation      General Comments General  comments (skin integrity, edema, etc.): Pt resisting movement of UEs keeping the elbows flexed and can stretch pt but not fully.  Bil LEs stiffen up as well.      Exercises General Exercises - Upper Extremity Shoulder Flexion: AAROM;Both;5 reps;Supine Elbow Flexion: AAROM;Both;5 reps;Supine Elbow Extension: AAROM;Both;5 reps;Supine General Exercises - Lower Extremity Ankle Circles/Pumps: AAROM;Both;5 reps;Supine Heel Slides: AAROM;Both;5 reps;Supine      Assessment/Plan    PT Assessment Patient needs continued PT services  PT Diagnosis Generalized weakness   PT Problem List Decreased mobility;Decreased activity tolerance;Decreased range of motion;Decreased strength;Decreased knowledge of use of DME;Decreased safety awareness;Decreased knowledge of precautions;Decreased coordination;Decreased cognition  PT Treatment Interventions Gait training;Functional mobility training;Therapeutic activities;Patient/family education;Therapeutic exercise   PT Goals (Current goals can be found in the Care Plan section) Acute Rehab PT Goals Patient Stated Goal: to go home PT Goal Formulation: With patient Time For Goal Achievement: 02/24/16 Potential to Achieve Goals: Fair    Frequency Min 2X/week   Barriers to discharge Decreased caregiver support      Co-evaluation               End of Session   Activity Tolerance: Patient limited by fatigue (limited by cognition) Patient left: in bed;with call bell/phone within reach;with bed alarm set Nurse Communication: Mobility status;Need for lift equipment         Time: 5361-4431 PT Time Calculation (min) (ACUTE ONLY): 14 min   Charges:   PT Evaluation $PT Eval Moderate Complexity: 1 Procedure     PT G CodesBerline Lopes 03-10-16, 3:26 PM Wave Calzada,PT Acute Rehabilitation [phone] (pager)

## 2016-02-11 ENCOUNTER — Inpatient Hospital Stay (HOSPITAL_COMMUNITY): Payer: Commercial Managed Care - HMO

## 2016-02-11 DIAGNOSIS — E872 Acidosis, unspecified: Secondary | ICD-10-CM | POA: Insufficient documentation

## 2016-02-11 DIAGNOSIS — E0810 Diabetes mellitus due to underlying condition with ketoacidosis without coma: Secondary | ICD-10-CM

## 2016-02-11 DIAGNOSIS — E875 Hyperkalemia: Secondary | ICD-10-CM

## 2016-02-11 LAB — POCT I-STAT 3, ART BLOOD GAS (G3+)
Acid-base deficit: 1 mmol/L (ref 0.0–2.0)
BICARBONATE: 22.1 meq/L (ref 20.0–24.0)
O2 Saturation: 95 %
PH ART: 7.457 — AB (ref 7.350–7.450)
PO2 ART: 72 mmHg — AB (ref 80.0–100.0)
TCO2: 23 mmol/L (ref 0–100)
pCO2 arterial: 31.3 mmHg — ABNORMAL LOW (ref 35.0–45.0)

## 2016-02-11 LAB — GLUCOSE, CAPILLARY
GLUCOSE-CAPILLARY: 31 mg/dL — AB (ref 65–99)
GLUCOSE-CAPILLARY: 82 mg/dL (ref 65–99)
GLUCOSE-CAPILLARY: 186 mg/dL — AB (ref 65–99)
GLUCOSE-CAPILLARY: 66 mg/dL (ref 65–99)
Glucose-Capillary: 81 mg/dL (ref 65–99)
Glucose-Capillary: 70 mg/dL (ref 65–99)
Glucose-Capillary: 298 mg/dL — ABNORMAL HIGH (ref 65–99)

## 2016-02-11 LAB — CBC
HCT: 24 % — ABNORMAL LOW (ref 36.0–46.0)
Hemoglobin: 7.2 g/dL — ABNORMAL LOW (ref 12.0–15.0)
MCH: 23.3 pg — AB (ref 26.0–34.0)
MCHC: 30 g/dL (ref 30.0–36.0)
MCV: 77.7 fL — AB (ref 78.0–100.0)
PLATELETS: 223 10*3/uL (ref 150–400)
RBC: 3.09 MIL/uL — ABNORMAL LOW (ref 3.87–5.11)
RDW: 18.5 % — AB (ref 11.5–15.5)
WBC: 10.4 10*3/uL (ref 4.0–10.5)

## 2016-02-11 LAB — MAGNESIUM: Magnesium: 2.3 mg/dL (ref 1.7–2.4)

## 2016-02-11 LAB — BASIC METABOLIC PANEL
Anion gap: 6 (ref 5–15)
CHLORIDE: 117 mmol/L — AB (ref 101–111)
CO2: 23 mmol/L (ref 22–32)
CREATININE: 0.69 mg/dL (ref 0.44–1.00)
Calcium: 8.1 mg/dL — ABNORMAL LOW (ref 8.9–10.3)
GFR calc Af Amer: >60 mL/min (ref >60)
GFR calc non Af Amer: >60 mL/min (ref >60)
Glucose, Bld: 52 mg/dL — ABNORMAL LOW (ref 65–99)
Potassium: 3.8 mmol/L (ref 3.5–5.1)
SODIUM: 146 mmol/L — AB (ref 135–145)

## 2016-02-11 LAB — PROCALCITONIN: Procalcitonin: 3.43 ng/mL

## 2016-02-11 LAB — PHOSPHORUS: Phosphorus: 1.7 mg/dL — ABNORMAL LOW (ref 2.5–4.6)

## 2016-02-11 LAB — OCCULT BLOOD X 1 CARD TO LAB, STOOL: FECAL OCCULT BLD: NEGATIVE

## 2016-02-11 LAB — PROTIME-INR
INR: 1.52 — ABNORMAL HIGH (ref 0.00–1.49)
Prothrombin Time: 18.4 seconds — ABNORMAL HIGH (ref 11.6–15.2)

## 2016-02-11 LAB — TSH: TSH: 1.649 u[IU]/mL (ref 0.350–4.500)

## 2016-02-11 LAB — HEPARIN LEVEL (UNFRACTIONATED)
HEPARIN UNFRACTIONATED: 0.53 [IU]/mL (ref 0.30–0.70)
Heparin Unfractionated: 0.38 IU/mL (ref 0.30–0.70)

## 2016-02-11 LAB — AMMONIA: AMMONIA: 43 umol/L — AB (ref 9–35)

## 2016-02-11 MED ORDER — DEXTROSE 50 % IV SOLN
INTRAVENOUS | Status: AC
  Administered 2016-02-11: 25 mL
  Filled 2016-02-11: qty 50

## 2016-02-11 MED ORDER — INSULIN DETEMIR 100 UNIT/ML ~~LOC~~ SOLN
10.0000 [IU] | Freq: Every day | SUBCUTANEOUS | Status: DC
  Administered 2016-02-11 – 2016-02-12 (×2): 10 [IU] via SUBCUTANEOUS
  Filled 2016-02-11 (×3): qty 0.1

## 2016-02-11 MED ORDER — PANTOPRAZOLE SODIUM 40 MG PO TBEC
40.0000 mg | DELAYED_RELEASE_TABLET | Freq: Every day | ORAL | Status: DC
  Administered 2016-02-11 – 2016-02-14 (×4): 40 mg via ORAL
  Filled 2016-02-11 (×4): qty 1

## 2016-02-11 MED ORDER — DEXTROSE-NACL 5-0.9 % IV SOLN
INTRAVENOUS | Status: DC
  Administered 2016-02-11: 09:00:00 via INTRAVENOUS

## 2016-02-11 MED ORDER — INSULIN DETEMIR 100 UNIT/ML ~~LOC~~ SOLN
10.0000 [IU] | Freq: Every day | SUBCUTANEOUS | Status: DC
Start: 2016-02-11 — End: 2016-02-11
  Filled 2016-02-11: qty 0.1

## 2016-02-11 NOTE — Evaluation (Signed)
Clinical/Bedside Swallow Evaluation Patient Details  Name: Madison [name] MRN: [id number] Date of Birth: 1961-01-13  Today's Date: 02/11/2016 Time: SLP Start Time (ACUTE ONLY): 1346 SLP Stop Time (ACUTE ONLY): 1359 SLP Time Calculation (min) (ACUTE ONLY): 13 min  Past Medical History:  Past Medical History  Diagnosis Date  . CAD (coronary artery disease)   . Obesity   . Hypercholesteremia   . HTN (hypertension)   . Dyslipidemia   . Aortic stenosis   . Thrombophlebitis   . Heart murmur   . Myocardial infarction (HCC) 12/2014  . Pneumonia 11/2014; 12/2014  . GERD (gastroesophageal reflux disease)   . 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 Anxiety   . Depression   . Allergy   . CHF (congestive heart failure) (HCC)   . Rheumatoid arthritis(714.0)     "hands" (07/01/2015)  . Diabetes (HCC) dx'd 1982    "went straight to insulin"   Past Surgical History:  Past Surgical History  Procedure Laterality Date  . Cesarean section  1995  . Foot fracture surgery    . Knee arthroscopy Right   . Neuroplasty / transposition median nerve at carpal tunnel    . Left heart catheterization with coronary angiogram N/A 12/08/2014    Procedure: LEFT HEART CATHETERIZATION WITH CORONARY ANGIOGRAM;  Surgeon: Iran Ouch, MD;  Location: [facility] CATH LAB;  Service: Cardiovascular;  Laterality: N/A;  . Percutaneous coronary stent intervention (pci-s) N/A 12/09/2014    Procedure: PERCUTANEOUS CORONARY STENT INTERVENTION (PCI-S);  Surgeon: [name], MD;  Location[facility] C[facility] LAB;  Service: Cardiovascular;  Laterality: N/A;  . Cardiac catheterization  12/08/2014  . Coronary angioplasty with stent placement  12/09/2014  . Aortic valve replacement (avr)/coronary artery bypass grafting (cabg)  "2014"    [name] 03/08/2014  . Fracture surgery    . Tubal ligation  1995  . Cataract extraction Left   . Coronary artery bypass graft    . Orif  ankle fracture Left 07/04/2015    Procedure: OPEN REDUCTION INTERNAL FIXATION (ORIF)  TRIMAL ANKLE FRACTURE;  Surgeon: [name], MD;  Location: [facility] OR;  Service: Orthopedics;  Laterality: Left;   HPI:  This is a 55 year old female w/ known h/o severe and difficult to control DM. Last seen by PCP on 3/20 w/ report of nausea and vomiting. Poor PO intake. Glucose at that time in office was 47; but had been up to 311 the evening before. She was treated w/ zofran and sent home. Little hx is available from that time on; other than EMS was called d/t pt found w/ altered mental status. On arrival CBG to high to measure. Chest x ray without active disease.    Assessment / Plan / Recommendation Clinical Impression  Pt presents with a mild oral dysphagia impacted by edentulous upper oral cavity limiting functional mastication of solid PO. No overt signs or symptoms of reduced airway protection with any PO this date. Recommend continue dysphagia 3 (mechanical soft) and thin liquid diet with aspiration precautions. No further ST needs identified.     Aspiration Risk  Mild aspiration risk    Diet Recommendation     Medication Administration: Whole meds with liquid    Other  Recommendations Oral Care Recommendations: Oral care BID   Follow up Recommendations  Skilled Nursing facility (given medical hx and poor independent functioning )    Frequency and Duration  Prognosis        Swallow Study   General Date of Onset: 02/08/16 HPI: This is a 55 year old female w/ known h/o severe and difficult to control DM. Last seen by PCP on 3/20 w/ report of nausea and vomiting. Poor PO intake. Glucose at that time in office was 47; but had been up to 311 the evening before. She was treated w/ zofran and sent home. Little hx is available from that time on; other than EMS was called d/t pt found w/ altered mental status. On arrival CBG to high to measure. Chest x ray without active disease.  Type of  Study: Bedside Swallow Evaluation Previous Swallow Assessment: multiple BSE on prior admissions with dys 3, thin rec Diet Prior to this Study: Dysphagia 3 (soft);Thin liquids Temperature Spikes Noted: No Respiratory Status: Room air History of Recent Intubation: No Behavior/Cognition: Alert;Pleasant mood;Cooperative Oral Cavity Assessment: Within Functional Limits Oral Cavity - Dentition: Edentulous;Adequate natural dentition (edentulous upper, partial natural dentition lower ) Self-Feeding Abilities: Able to feed self;Needs set up Patient Positioning: Upright in bed Baseline Vocal Quality: Low vocal intensity (reduced speech intelligibility) Volitional Swallow: Able to elicit    Oral/Motor/Sensory Function Overall Oral Motor/Sensory Function: Within functional limits   Ice Chips Ice chips: Within functional limits   Thin Liquid Thin Liquid: Within functional limits    Nectar Thick Nectar Thick Liquid: Not tested   Honey Thick Honey Thick Liquid: Not tested   Puree Puree: Within functional limits   Solid   GO   Solid: Impaired Oral Phase Impairments: Impaired mastication;Reduced lingual movement/coordination Oral Phase Functional Implications: Prolonged oral transit;Impaired mastication       Marcene Duos MA, CCC-SLP Acute Care Speech Language Pathologist    Kennieth Rad 02/11/2016,2:06 PM

## 2016-02-11 NOTE — Progress Notes (Signed)
Patients blood sugar checked around 6am, noted to be 32. Pt arousable ,vss. Gave 1/2 amp of dextrose , followed by NS Flush. Rechecked blood sugar and noted to be 82. Pt awake and responding appropriately to staff. Will continue to monitor.

## 2016-02-11 NOTE — Progress Notes (Signed)
ANTICOAGULATION CONSULT NOTE - Follow Up Consult  Pharmacy Consult for heparin Indication: MVR   Labs:  Recent Labs  02/09/16 0400 02/09/16 1243 02/10/16 0010 02/10/16 0600 02/10/16 2026 02/11/16 0217  HGB 8.0*  --   --  7.0*  --  7.2*  HCT 25.5*  --   --  22.2*  --  24.0*  PLT 328  --   --  212  --  223  LABPROT 89.4* 47.4*  --  22.3*  --  18.4*  INR >10.00* 5.37*  --  1.97*  --  1.52*  HEPARINUNFRC  --   --   --   --  0.24* 0.38  CREATININE 1.54* 1.11* 0.85 0.83  --  0.69     Assessment/Plan:  55yo female therapeutic on heparin after rate increase. Will continue gtt at current rate and confirm stable with additional level.   Vernard Gambles, PharmD, BCPS  02/11/2016,3:51 AM

## 2016-02-11 NOTE — Progress Notes (Signed)
TRH progress note   Name: Madison [name] MRN: [id number] DOB: [birth year]    ADMISSION DATE:  02/08/2016 CONSULTATION DATE:  3/22 REFERRING MD:  Rubin Payor   CHIEF COMPLAINT:   Acute encephalopathy and DKA  HISTORY OF PRESENT ILLNESS:   This is a 55 year old female w/ known h/o severe and difficult to control DM. Last seen by PCP on 3/20 w/ report of nausea and vomiting. Poor PO intake. Glucose at that time in office was 47; but had been up to 311 the evening before. She was treated w/ zofran and sent home. Little hx is available from that time on; other than EMS was called d/t pt found w/ altered mental status. On arrival CBG to high to measure. On ED arrival w/ bicarb < 7, Ph 6.9. Unresponsive, w/ increased RR. PCCM was asked to admit. Patient required vasopressors, admitted w/ DKA protocol,  Additional issues are hypotension requiring vasopressors and coumadin induced coagulopathy. She probably needs to be discharged to an SNF after this as clearly she is not doing well at home.     SUBJECTIVE:  Patient very somnolent, confused,  VITAL SIGNS: BP 129/62 mmHg  Pulse 83  Temp(Src) 98.3 F (36.8 C) (Oral)  Resp 20  Ht 5\' 4"  (1.626 m)  Wt 76 kg (167 lb 8.8 oz)  BMI 28.75 kg/m2  SpO2 99% 100% HEMODYNAMICS: CVP:  [8 mmHg-12 mmHg] 10 mmHg  VENTILATOR SETTINGS:    INTAKE / OUTPUT: I/O last 3 completed shifts: In: 5038.3 [P.O.:440; I.V.:3898.3; IV Piggyback:700] Out: 1980 [Urine:1980]  PHYSICAL EXAMINATION: General:  Chronically ill appearing white female. Moans, nods intermittently  Neuro:  Opens eyes, moans only, no focal def  HEENT:  Mucous membranes are dry, neck vein is flat  Cardiovascular:  Rrr, + SEM + metallic click Lungs:  resps even non labored on [place], diminished bases otherwise clear   Abdomen:  Soft, + bowel sounds  Musculoskeletal:  Intact, no focal def  Skin:  Warm, dry and intact   LABS:  BMET  Recent Labs Lab 02/10/16 0010 02/10/16 0600 02/11/16 0217   NA 146* 143 146*  K 3.0* 3.3* 3.8  CL 118* 116* 117*  CO2 19* 18* 23  BUN 5* <5* <5*  CREATININE 0.85 0.83 0.69  GLUCOSE 125* 115* 52*    Electrolytes  Recent Labs Lab 02/09/16 0400  02/09/16 1421 02/10/16 0010 02/10/16 0600 02/11/16 0217  CALCIUM 7.7*  < >  --  7.2* 7.2* 8.1*  MG 1.5*  --   --   --  1.4* 2.3  PHOS <1.0*  --  3.3  --  2.0* 1.7*  < > = values in this interval not displayed.  CBC  Recent Labs Lab 02/09/16 0400 02/10/16 0600 02/11/16 0217  WBC 16.2* 13.4* 10.4  HGB 8.0* 7.0* 7.2*  HCT 25.5* 22.2* 24.0*  PLT 328 212 223    Coag's  Recent Labs Lab 02/09/16 1243 02/10/16 0600 02/11/16 0217  INR 5.37* 1.97* 1.52*    Sepsis Markers  Recent Labs Lab 02/08/16 1338 02/09/16 0400 02/09/16 1243 02/09/16 1245 02/10/16 0600 02/11/16 0217  LATICACIDVEN 7.7* 7.5*  --  1.4  --   --   PROCALCITON  --   --  9.17  --  5.27 3.43    ABG  Recent Labs Lab 02/08/16 1153  PHART 6.730*  PCO2ART 17.6*  PO2ART 72*    Liver Enzymes  Recent Labs Lab 02/08/16 1141 02/10/16 0600  AST 22  --  ALT 15  --   ALKPHOS 114  --   BILITOT 0.9  --   ALBUMIN 3.2* 2.1*    Cardiac Enzymes No results for input(s): TROPONINI, PROBNP in the last 168 hours.  Glucose  Recent Labs Lab 02/10/16 1748 02/10/16 2150 02/10/16 2245 02/11/16 0628 02/11/16 0701 02/11/16 0743  GLUCAP 118* 81 84 31* 82 70    Imaging No results found.   STUDIES:    CULTURES: BCX2 3/22>>> UC 3/22>>>  ANTIBIOTICS: Zosyn 3/22>>> vanc 3/22>>>  SIGNIFICANT EVENTS:   LINES/TUBES: Right IJ CVL 3//22>>>  .      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 Likely multi-factorial: Hypovolemic shock;vs  possible sepsis in setting of DKA; may be a component of cardiogenic due to acidosis and CO suppression.  Continue Heparin per pharmacy. INR Subtherapeutic, Restart Coumadin when able      Mixed AG/NAG metabolic acidosis in setting of lactic acidosis, lactic acidosis/ketoacidosis w/ bicarb loss from vomiting (likely) Profound hypokalemia  hypoPHOS AKI Continue   IV fluids Hold antihypertensives Strict I&O Replace electrolytes as indicated.     SIRS-no intra-abdominal source of infection on CT abdomen pelvis Diabetic diet.  SIRS likely secondary to DKA       COUMADIN induced coagulopathy Anemia of chronic disease -hemoglobin 11 on 3/20 , likely hemoconcentrated, his line hemoglobin around 8.8 Hemoglobin has trended down during this admission Transfuse for hemoglobin less than 7.0 Stool guaiac, PPI     Probable sepsis  Culture blood and urine  Empiric abx as above , repeat chest x-ray today, UA negative, blood culture negative    DKA w/ profound hyperglycemia  Hypoglycemia this morning  Off  Insulin gtt per DKA protocol  Currently on Levemir, dose reduced, hemoglobin A1c 9.9     Acute Encephalopathy  RASS goal: 0 Hold any sedation  ABG, repeat chest x-ray, check ammonia level check TSH       Discussed with bedside RN   Transfer to SDU and to [facility] service with PCCM off 3/25.  Richarda Overlie, M.D Pager [phone].

## 2016-02-11 NOTE — Progress Notes (Addendum)
ANTICOAGULATION CONSULT NOTE - Follow-up Consult  Pharmacy Consult for Heparin Indication: mechanical valve MVR  Allergies  Allergen Reactions  . Other Other (See Comments)    Can burn skin if left on too long.TAPE  . Celebrex [Celecoxib] Rash  . Detrol [Tolterodine] Hives    Patient Measurements: Height: 5\' 4"  (162.6 cm) Weight: 167 lb 8.8 oz (76 kg) IBW/kg (Calculated) : 54.7  Heparin dosing weight = 64 kg   Vital Signs: Temp: 98.3 F (36.8 C) (03/25 0746) Temp Source: Oral (03/25 0746) BP: 129/62 mmHg (03/25 0800) Pulse Rate: 83 (03/24 2309)  Labs:  Recent Labs  02/09/16 0400 02/09/16 1243 02/10/16 0010 02/10/16 0600 02/10/16 2026 02/11/16 0217 02/11/16 0932  HGB 8.0*  --   --  7.0*  --  7.2*  --   HCT 25.5*  --   --  22.2*  --  24.0*  --   PLT 328  --   --  212  --  223  --   LABPROT 89.4* 47.4*  --  22.3*  --  18.4*  --   INR >10.00* 5.37*  --  1.97*  --  1.52*  --   HEPARINUNFRC  --   --   --   --  0.24* 0.38 0.53  CREATININE 1.54* 1.11* 0.85 0.83  --  0.69  --     Estimated Creatinine Clearance: 79.3 mL/min (by C-G formula based on Cr of 0.69).   Medical History: Past Medical History  Diagnosis Date  . CAD (coronary artery disease)   . Obesity   . Hypercholesteremia   . HTN (hypertension)   . Dyslipidemia   . Aortic stenosis   . Thrombophlebitis   . Heart murmur   . Myocardial infarction (HCC) 12/2014  . Pneumonia 11/2014; 12/2014  . GERD (gastroesophageal reflux disease)   . Stroke syndrome [facility]) 1995; 2001    "when my son was born; problems w/speech and L hand since then" (01/19/2015)  . Anxiety   . Depression   . Allergy   . CHF (congestive heart failure) (HCC)   . Rheumatoid arthritis(714.0)     "hands" (07/01/2015)  . Diabetes (HCC) dx'd 1982    "went straight to insulin"    Assessment: 55yof admitted with N/V, altered MS from DKA.  She is on warfarin PTA for mechanical MVR.  INR elevated 7.56 rose to 12 and reversed with IV vitamin K  5mg . INR now 1.97. CBC stable. On IV heparin for now with therapeutic heparin level *2. CBC stable, no signs of bleeding. INR 1.52   Goal of Therapy:  INR 2.5-3.5 Heparin level 0.3-0.7 units/ml Monitor platelets by anticoagulation protocol: Yes   Plan:  Continue heparin at current rate Daily heparin level, CBC   08/31/2015, PharmD Clinical Pharmacy Resident Pager: [phone] 02/11/2016 11:01 AM

## 2016-02-12 LAB — GLUCOSE, CAPILLARY
GLUCOSE-CAPILLARY: 115 mg/dL — AB (ref 65–99)
GLUCOSE-CAPILLARY: 302 mg/dL — AB (ref 65–99)
Glucose-Capillary: 331 mg/dL — ABNORMAL HIGH (ref 65–99)
Glucose-Capillary: 278 mg/dL — ABNORMAL HIGH (ref 65–99)

## 2016-02-12 LAB — COMPREHENSIVE METABOLIC PANEL
ALK PHOS: 83 U/L (ref 38–126)
ALT: 21 U/L (ref 14–54)
ANION GAP: 5 (ref 5–15)
AST: 19 U/L (ref 15–41)
Albumin: 2 g/dL — ABNORMAL LOW (ref 3.5–5.0)
BILIRUBIN TOTAL: 0.7 mg/dL (ref 0.3–1.2)
BUN: 8 mg/dL (ref 6–20)
CALCIUM: 7.8 mg/dL — AB (ref 8.9–10.3)
CO2: 22 mmol/L (ref 22–32)
CREATININE: 0.84 mg/dL (ref 0.44–1.00)
Chloride: 113 mmol/L — ABNORMAL HIGH (ref 101–111)
Glucose, Bld: 245 mg/dL — ABNORMAL HIGH (ref 65–99)
Potassium: 4.3 mmol/L (ref 3.5–5.1)
Sodium: 140 mmol/L (ref 135–145)
TOTAL PROTEIN: 4.6 g/dL — AB (ref 6.5–8.1)

## 2016-02-12 LAB — CBC
HEMATOCRIT: 23.9 % — AB (ref 36.0–46.0)
Hemoglobin: 7.4 g/dL — ABNORMAL LOW (ref 12.0–15.0)
MCH: 24.2 pg — AB (ref 26.0–34.0)
MCHC: 31 g/dL (ref 30.0–36.0)
MCV: 78.1 fL (ref 78.0–100.0)
Platelets: 165 10*3/uL (ref 150–400)
RBC: 3.06 MIL/uL — ABNORMAL LOW (ref 3.87–5.11)
RDW: 18.4 % — AB (ref 11.5–15.5)
WBC: 5.4 10*3/uL (ref 4.0–10.5)

## 2016-02-12 LAB — IRON AND TIBC
IRON: 77 ug/dL (ref 28–170)
Saturation Ratios: 27 % (ref 10.4–31.8)
TIBC: 288 ug/dL (ref 250–450)
UIBC: 211 ug/dL

## 2016-02-12 LAB — RETICULOCYTES
RBC.: 3.2 MIL/uL — ABNORMAL LOW (ref 3.87–5.11)
RETIC COUNT ABSOLUTE: 35.2 10*3/uL (ref 19.0–186.0)
RETIC CT PCT: 1.1 % (ref 0.4–3.1)

## 2016-02-12 LAB — FOLATE: Folate: 9.3 ng/mL (ref >5.9)

## 2016-02-12 LAB — HEPARIN LEVEL (UNFRACTIONATED): Heparin Unfractionated: 0.45 IU/mL (ref 0.30–0.70)

## 2016-02-12 LAB — VITAMIN B12: Vitamin B-12: 510 pg/mL (ref 180–914)

## 2016-02-12 LAB — PROTIME-INR
INR: 1.21 (ref 0.00–1.49)
Prothrombin Time: 15.5 seconds — ABNORMAL HIGH (ref 11.6–15.2)

## 2016-02-12 LAB — FERRITIN: FERRITIN: 69 ng/mL (ref 11–307)

## 2016-02-12 MED ORDER — WARFARIN - PHARMACIST DOSING INPATIENT: Freq: Every day | Status: DC

## 2016-02-12 MED ORDER — PNEUMOCOCCAL VAC POLYVALENT 25 MCG/0.5ML IJ INJ
0.5000 mL | INJECTION | INTRAMUSCULAR | Status: AC
  Administered 2016-02-13: 0.5 mL via INTRAMUSCULAR
  Filled 2016-02-12: qty 0.5

## 2016-02-12 MED ORDER — PRAVASTATIN SODIUM 40 MG PO TABS
40.0000 mg | ORAL_TABLET | Freq: Every day | ORAL | Status: DC
  Administered 2016-02-12 – 2016-02-14 (×3): 40 mg via ORAL
  Filled 2016-02-12 (×3): qty 1

## 2016-02-12 MED ORDER — WARFARIN SODIUM 7.5 MG PO TABS
7.5000 mg | ORAL_TABLET | Freq: Once | ORAL | Status: AC
  Administered 2016-02-12: 7.5 mg via ORAL
  Filled 2016-02-12: qty 1

## 2016-02-12 NOTE — Progress Notes (Signed)
T [facility] Progress Note                                            Patient Demographics:    Madison [name], is a 55 y.o. female, DOB - Sep 12, 1961, GNF:[id number]  Admit date - 02/08/2016   Admitting Physician Roslynn Amble, MD  Outpatient Primary MD for the patient is [name], NP   Subjective:  Feels better, wants to eat   Assessment  & Plan :   PCCM Transfer to [facility] 3/26:  1-DKA, without coma -DKA resolved, multiple admissions for DKA -actively follow by Dr. Romero Belling  -takes insulin 70/30 at home, now on levemir, novolog and SSI -A1C pending  2-SIRS -due to DKA, resolved -all cultures negative, Stop Abx and monitor  3-metallic valve replacement (AVR) -chronically on coumadin -INR subtherapeutic on heparin now, resume warfarin  4-chronic combined heart failure  -last echo with EF 45-50% and grade 2 diastolic dysfunction  -currently compensated -heart healthy diet   5-hx of CAD -no CP -continue aspirin and statins   6-depression  -continue lexapro  8-hypokalemia: -resolved, repleted   9-GERD:  -continue PPI  10-HTN -stable, lisinopril on hold  11-hx of CVA: with mild left residual deficit  -continue warfarin  Code Status : FUll Code Family Communication  : none at bedside Disposition Plan  : home when INR therpaeutic  Consults  :  PCCM  DVT Prophylaxis  : heparin/warfarin  Lab Results  Component Value Date   PLT 165 02/12/2016    Antibiotics  : Vanc/zosyn  Anti-infectives    Start     Dose/Rate Route Frequency Ordered Stop   02/10/16 1200  vancomycin (VANCOCIN) IVPB 750 mg/150 ml premix  Status:  Discontinued     750 mg 150 mL/hr over 60 Minutes Intravenous Every 12 hours 02/10/16 1151 02/12/16 0751   02/09/16 1500  vancomycin (VANCOCIN) IVPB 750 mg/150 ml premix  Status:  Discontinued     750 mg 150 mL/hr over  60 Minutes Intravenous Every 24 hours 02/08/16 1411 02/10/16 1151   02/08/16 2230  piperacillin-tazobactam (ZOSYN) IVPB 3.375 g  Status:  Discontinued     3.375 g 12.5 mL/hr over 240 Minutes Intravenous Every 8 hours 02/08/16 1411 02/12/16 0751   02/08/16 1415  piperacillin-tazobactam (ZOSYN) IVPB 3.375 g     3.375 g 100 mL/hr over 30 Minutes Intravenous  Once 02/08/16 1407 02/08/16 1459   02/08/16 1415  vancomycin (VANCOCIN) 1,500 mg in sodium chloride 0.9 % 500 mL IVPB     1,500 mg 250 mL/hr over 120 Minutes Intravenous  Once 02/08/16 1407 02/08/16 1705        Objective:   Filed Vitals:   02/12/16 0600 02/12/16 0736 02/12/16 0800 02/12/16 1233  BP: 129/54 131/64 128/79 115/38  Pulse:    81  Temp:  99 F (37.2 C)  99.3 F (37.4 C)  TempSrc:  Oral  Oral  Resp: 16 25 22 18   Height:      Weight: 69.2 kg (152 lb 8.9 oz)     SpO2: 99% 99% 99% 100%    Wt Readings from Last 3 Encounters:  02/12/16 69.2 kg (152 lb 8.9 oz)  02/06/16 65.227 kg (143 lb 12.8 oz)  01/30/16 72.2 kg (159 lb 2.8 oz)     Intake/Output Summary (Last 24 hours) at 02/12/16 1327 Last data filed at 02/12/16  0900  Gross per 24 hour  Intake    147 ml  Output      0 ml  Net    147 ml     Physical Exam  Awake Alert, Oriented X 3, No new F.N deficits, Normal affect Trail.AT,PERRAL Supple Neck,No JVD, No cervical lymphadenopathy appriciated.  Symmetrical Chest wall movement, Good air movement bilaterally, CTAB RRR,No Gallops,Rubs or new Murmurs, No Parasternal Heave +ve B.Sounds, Abd Soft, No tenderness, No organomegaly appriciated, No rebound - guarding or rigidity. No Cyanosis, Clubbing or edema, No new Rash or bruise      Data Review:    CBC  Recent Labs Lab 02/06/16 1030  02/08/16 1141 02/08/16 1337 02/09/16 0400 02/10/16 0600 02/11/16 0217 02/12/16 0420  WBC 12.7*  --  26.2* 27.0* 16.2* 13.4* 10.4 5.4  HGB 10.4*  --  11.0* 7.8* 8.0* 7.0* 7.2* 7.4*  HCT 32.5*  --  36.8 28.9* 25.5* 22.2*  24.0* 23.9*  PLT 502.0*  --  593* 516* 328 212 223 165  MCV 77.8*  --  84.2 89.5 78.5 77.1* 77.7* 78.1  MCH  --   < > 25.2* 24.1* 24.6* 24.3* 23.3* 24.2*  MCHC 32.2  --  29.9* 27.0* 31.4 31.5 30.0 31.0  RDW 20.0*  --  20.4* 19.8* 17.9* 18.4* 18.5* 18.4*  LYMPHSABS 3.3  --  5.2*  --   --   --   --   --   MONOABS 0.6  --  1.6*  --   --   --   --   --   EOSABS 0.0  --  0.0  --   --   --   --   --   BASOSABS 0.1  --  0.0  --   --   --   --   --   < > = values in this interval not displayed.  Chemistries   Recent Labs Lab 02/08/16 1141 02/08/16 1337  02/09/16 0400 02/09/16 1243 02/10/16 0010 02/10/16 0600 02/11/16 0217 02/12/16 0420  NA 131* 138  < > 150* 148* 146* 143 146* 140  K >7.5* 7.2*  < > 4.0 3.5 3.0* 3.3* 3.8 4.3  CL 97* 103  < > 120* 118* 118* 116* 117* 113*  CO2 <7* <7*  < > 15* 19* 19* 18* 23 22  GLUCOSE 1300* 1194*  < > 205* 176* 125* 115* 52* 245*  BUN 21* 24*  < > 11 9 5* <5* <5* 8  CREATININE 2.74* 2.42*  < > 1.54* 1.11* 0.85 0.83 0.69 0.84  CALCIUM 8.8* 7.7*  < > 7.7* 7.4* 7.2* 7.2* 8.1* 7.8*  MG  --  2.5*  --  1.5*  --   --  1.4* 2.3  --   AST 22  --   --   --   --   --   --   --  19  ALT 15  --   --   --   --   --   --   --  21  ALKPHOS 114  --   --   --   --   --   --   --  83  BILITOT 0.9  --   --   --   --   --   --   --  0.7  < > = values in this interval not displayed. ------------------------------------------------------------------------------------------------------------------ No results for input(s): CHOL, HDL, LDLCALC, TRIG, CHOLHDL, LDLDIRECT in the last 72 hours.  Lab Results  Component Value Date   HGBA1C 9.9* 01/28/2016   ------------------------------------------------------------------------------------------------------------------  Recent Labs  02/11/16 1112  TSH 1.649   ------------------------------------------------------------------------------------------------------------------  Recent Labs  02/12/16 1219  RETICCTPCT 1.1     Coagulation profile  Recent Labs Lab 02/09/16 0400 02/09/16 1243 02/10/16 0600 02/11/16 0217 02/12/16 0431  INR >10.00* 5.37* 1.97* 1.52* 1.21    No results for input(s): DDIMER in the last 72 hours.  Cardiac Enzymes No results for input(s): CKMB, TROPONINI, MYOGLOBIN in the last 168 hours.  Invalid input(s): CK ------------------------------------------------------------------------------------------------------------------    Component Value Date/Time   BNP 398.4* 07/30/2015 1400    Inpatient Medications  Scheduled Meds: . insulin aspart  0-9 Units Subcutaneous TID WC  . insulin aspart  4 Units Subcutaneous TID WC  . insulin detemir  10 Units Subcutaneous Daily  . pantoprazole  40 mg Oral Daily  . [START ON 02/13/2016] pneumococcal 23 valent vaccine  0.5 mL Intramuscular Tomorrow-1000  . sodium chloride flush  10-40 mL Intracatheter Q12H  . warfarin  7.5 mg Oral ONCE-1800  . Warfarin - Pharmacist Dosing Inpatient   Does not apply q1800   Continuous Infusions: . heparin 1,050 Units/hr (02/12/16 0000)   PRN Meds:.sodium chloride, sodium chloride, LORazepam  Micro Results Recent Results (from the past 240 hour(s))  Culture, blood (routine x 2)     Status: None (Preliminary result)   Collection Time: 02/08/16  1:08 PM  Result Value Ref Range Status   Specimen Description BLOOD RIGHT HAND  Final   Special Requests BOTTLES DRAWN AEROBIC ONLY 5CC  Final   Culture NO GROWTH 3 DAYS  Final   Report Status PENDING  Incomplete  Culture, blood (routine x 2)     Status: None (Preliminary result)   Collection Time: 02/08/16  1:08 PM  Result Value Ref Range Status   Specimen Description BLOOD RIGHT ANTECUBITAL  Final   Special Requests BOTTLES DRAWN AEROBIC ONLY 7CC  Final   Culture NO GROWTH 3 DAYS  Final   Report Status PENDING  Incomplete  Urine culture     Status: None   Collection Time: 02/08/16  4:16 PM  Result Value Ref Range Status   Specimen Description  URINE, CATHETERIZED  Final   Special Requests NONE  Final   Culture NO GROWTH 2 DAYS  Final   Report Status 02/10/2016 FINAL  Final  MRSA PCR Screening     Status: None   Collection Time: 02/08/16  4:27 PM  Result Value Ref Range Status   MRSA by PCR NEGATIVE NEGATIVE Final    Comment:        The GeneXpert MRSA Assay (FDA approved for NASAL specimens only), is one component of a comprehensive MRSA colonization surveillance program. It is not intended to diagnose MRSA infection nor to guide or monitor treatment for MRSA infections.     Radiology Reports Ct Abdomen Pelvis Wo Contrast  02/09/2016  CLINICAL DATA:  Vomiting and lactic acidosis. Diabetic. On Coumadin. EXAM: CT ABDOMEN AND PELVIS WITHOUT CONTRAST TECHNIQUE: Multidetector CT imaging of the abdomen and pelvis was performed following the standard protocol without IV contrast. COMPARISON:  KUB 11/23/2015 FINDINGS: Lower chest: Minimal pleural effusion bilaterally with minimal bibasilar atelectasis. Negative for pneumonia. Aortic valve replacement. Hepatobiliary: Unenhanced images of the liver show normal liver size. No focal liver lesion. Small layering calcified gallstones without gallbladder wall thickening. Bile ducts nondilated. Pancreas: Negative Spleen: Negative Adrenals/Urinary Tract: No renal mass or obstruction on unenhanced imaging. Extensive vascular calcification  of the renal arteries. Urinary bladder is normal with a Foley catheter. Stomach/Bowel: Negative for bowel obstruction. No bowel edema or mass. Negative for diverticulitis. Appendix not visualized. Vascular/Lymphatic: Extensive vascular calcification due to diabetes. No aortic aneurysm. Reproductive: Calcified uterine fibroids. The largest fibroid is posterior fundus measuring 26 mm. No adnexal mass. Other: No free fluid.  Small umbilical hernia. Musculoskeletal: Negative IMPRESSION: No acute abnormality in the abdomen Cholelithiasis. Electronically Signed   By:  Marlan Palau M.D.   On: 02/09/2016 14:13   Ct Head Wo Contrast  02/09/2016  CLINICAL DATA:  55 year old female with confusion, vomiting, lactic acidosis. Initial encounter. EXAM: CT HEAD WITHOUT CONTRAST TECHNIQUE: Contiguous axial images were obtained from the base of the skull through the vertex without intravenous contrast. COMPARISON:  Head CT without contrast 07/30/2015 and earlier FINDINGS: Small volume retained secretions in the nasopharynx. Mild ethmoid sinus mucosal thickening is new. Mastoids are clear. Tympanic cavities are clear. No acute osseous abnormality identified. No acute orbit soft tissue findings. Broad-based posterior scalp soft tissue swelling measuring up to 5 mm in thickness, most resembling hematoma (series 3, image 71). Underlying calvarium appears intact. Calcified atherosclerosis at the skull base. Encephalomalacia related to large chronic right MCA territory infarcts. Chronic left greater than right PCA and superior left MCA chronic infarcts small chronic left greater than right cerebellar infarcts. Stable cerebral volume. No ventriculomegaly. No midline shift, mass effect, or evidence of intracranial mass lesion. No acute intracranial hemorrhage identified. No cortically based acute infarct identified. No suspicious intracranial vascular hyperdensity. IMPRESSION: 1. Stable non contrast CT appearance of the brain. Advanced chronic ischemic disease with widespread encephalomalacia. 2. Broad-based posterior scalp soft tissue thickening most resembling scalp hematoma. No underlying fracture. Electronically Signed   By: Odessa Fleming M.D.   On: 02/09/2016 14:11   Dg Chest Port 1 View  02/11/2016  CLINICAL DATA:  Confusion EXAM: PORTABLE CHEST 1 VIEW COMPARISON:  02/08/2016 FINDINGS: Cardiomediastinal silhouette is stable. Status post median sternotomy and aortic valve replacement. Right IJ catheter with tip in right atrium. No infiltrate or pulmonary edema. No pneumothorax. IMPRESSION: No  active disease. Right IJ catheter with tip in right atrium. Status post median sternotomy. Electronically Signed   By: Natasha Mead M.D.   On: 02/11/2016 12:57   Dg Chest Port 1 View  02/08/2016  CLINICAL DATA:  Central line placement. EXAM: PORTABLE CHEST 1 VIEW COMPARISON:  Portable film earlier today. FINDINGS: RIGHT IJ catheter tip lies in the proximal RIGHT atrium. No pneumothorax. Unchanged aeration. Prior median sternotomy. IMPRESSION: Satisfactory appearance status post RIGHT IJ catheter placement. Tip lies in the proximal RIGHT atrium. Electronically Signed   By: Elsie Stain M.D.   On: 02/08/2016 13:09   Dg Chest Portable 1 View  02/08/2016  CLINICAL DATA:  55 year old female with a history of shortness of breath and weakness EXAM: PORTABLE CHEST 1 VIEW COMPARISON:  Multiple prior chest x-ray comparison, most recent 01/27/2016 FINDINGS: Cardiomediastinal silhouette unchanged in size and contour. Surgical changes of prior median sternotomy and CABG. There is uplifting of the minor fissure with peritracheal/paramediastinal density superior to the right mainstem. This is more pronounced than on the comparison plain film. No left-sided confluent airspace disease. No large pleural effusion. No pneumothorax. IMPRESSION: Uplifting of the minor fissure with density along the paramediastinal stripe, concerning for partial volume loss of the right upper lobe. This may reflect mucous plugging in the setting of infection, however, central obstructing mass cannot be excluded. Recommend further evaluation with contrast-enhanced  CT. Surgical changes of median sternotomy and CABG. Signed, Yvone Neu. Loreta Ave, DO [facility] Radiology Electronically Signed   By: Gilmer Mor D.O.   On: 02/08/2016 12:18   Dg Chest Portable 1 View  01/27/2016  CLINICAL DATA:  Diabetic ketoacidosis.  Confusion and tachycardia. EXAM: PORTABLE CHEST 1 VIEW COMPARISON:  11/23/2015 FINDINGS:  Patient is post median sternotomy. There is a prosthetic aortic valve. Heart is normal in size. No pulmonary edema, consolidation, pleural effusion or pneumothorax. IMPRESSION: No acute process.  Post median sternotomy. Electronically Signed   By: Rubye Oaks M.D.   On: 01/27/2016 03:25    Time Spent in minutes    Remo Kirschenmann M.D on 02/12/2016 at 1:27 PM  Between 7am to 7pm - Pager - [phone]  After 7pm go to [email or web address] - password [facility]  [facility] -  Office  [phone]

## 2016-02-12 NOTE — Progress Notes (Signed)
ANTICOAGULATION CONSULT NOTE - Follow-up Consult  Pharmacy Consult for Heparin/Warfarin Indication: mechanical valve MVR  Allergies  Allergen Reactions  . Other Other (See Comments)    Can burn skin if left on too long.TAPE  . Celebrex [Celecoxib] Rash  . Detrol [Tolterodine] Hives    Patient Measurements: Height: 5\' 4"  (162.6 cm) Weight: 152 lb 8.9 oz (69.2 kg) IBW/kg (Calculated) : 54.7  Heparin dosing weight = 64 kg  Vital Signs: Temp: 99 F (37.2 C) (03/26 0736) Temp Source: Oral (03/26 0736) BP: 131/64 mmHg (03/26 0736)  Labs:  Recent Labs  02/10/16 0600  02/11/16 0217 02/11/16 0932 02/12/16 0420 02/12/16 0431  HGB 7.0*  --  7.2*  --  7.4*  --   HCT 22.2*  --  24.0*  --  23.9*  --   PLT 212  --  223  --  165  --   LABPROT 22.3*  --  18.4*  --   --  15.5*  INR 1.97*  --  1.52*  --   --  1.21  HEPARINUNFRC  --   < > 0.38 0.53  --  0.45  CREATININE 0.83  --  0.69  --  0.84  --   < > = values in this interval not displayed.  Estimated Creatinine Clearance: 72.3 mL/min (by C-G formula based on Cr of 0.84).   Medical History: Past Medical History  Diagnosis Date  . CAD (coronary artery disease)   . Obesity   . Hypercholesteremia   . HTN (hypertension)   . Dyslipidemia   . Aortic stenosis   . Thrombophlebitis   . Heart murmur   . Myocardial infarction (HCC) 12/2014  . Pneumonia 11/2014; 12/2014  . GERD (gastroesophageal reflux disease)   . Stroke syndrome [facility]) 1995; 2001    "when my son was born; problems w/speech and L hand since then" (01/19/2015)  . Anxiety   . Depression   . Allergy   . CHF (congestive heart failure) (HCC)   . Rheumatoid arthritis(714.0)     "hands" (07/01/2015)  . Diabetes (HCC) dx'd 1982    "went straight to insulin"    Assessment: 55yof admitted with N/V, altered MS from DKA.  Was on warfarin PTA for mechanical MVR. INR elevated 7.56 on admission which increased to 12.48 >> reversed with IV vitamin K 5mg  and FFP. INR now 1.21.  CBC stable. On IV heparin which has been therapeutic. CBC stable, no signs of bleeding.  PTA: warfarin 7.5mg  daily   Goal of Therapy:  INR 2.5-3.5 Heparin level 0.3-0.7 units/ml Monitor platelets by anticoagulation protocol: Yes   Plan:  Continue heparin at current rate Warfarin 7.5mg  *1 tonight Daily heparin level, CBC, INR   08/31/2015, PharmD Clinical Pharmacy Resident Pager: [phone] 02/12/2016 8:27 AM

## 2016-02-13 ENCOUNTER — Other Ambulatory Visit: Payer: Self-pay

## 2016-02-13 DIAGNOSIS — E1011 Type 1 diabetes mellitus with ketoacidosis with coma: Secondary | ICD-10-CM | POA: Diagnosis not present

## 2016-02-13 DIAGNOSIS — I5042 Chronic combined systolic (congestive) and diastolic (congestive) heart failure: Secondary | ICD-10-CM | POA: Diagnosis not present

## 2016-02-13 DIAGNOSIS — R578 Other shock: Secondary | ICD-10-CM | POA: Diagnosis not present

## 2016-02-13 DIAGNOSIS — E669 Obesity, unspecified: Secondary | ICD-10-CM | POA: Diagnosis not present

## 2016-02-13 DIAGNOSIS — Z23 Encounter for immunization: Secondary | ICD-10-CM | POA: Diagnosis not present

## 2016-02-13 DIAGNOSIS — N179 Acute kidney failure, unspecified: Secondary | ICD-10-CM | POA: Diagnosis not present

## 2016-02-13 DIAGNOSIS — E875 Hyperkalemia: Secondary | ICD-10-CM | POA: Diagnosis not present

## 2016-02-13 DIAGNOSIS — I110 Hypertensive heart disease with heart failure: Secondary | ICD-10-CM | POA: Diagnosis not present

## 2016-02-13 LAB — GLUCOSE, CAPILLARY
GLUCOSE-CAPILLARY: 281 mg/dL — AB (ref 65–99)
GLUCOSE-CAPILLARY: 92 mg/dL (ref 65–99)
Glucose-Capillary: 356 mg/dL — ABNORMAL HIGH (ref 65–99)
Glucose-Capillary: 214 mg/dL — ABNORMAL HIGH (ref 65–99)

## 2016-02-13 LAB — BASIC METABOLIC PANEL
ANION GAP: 10 (ref 5–15)
BUN: 9 mg/dL (ref 6–20)
CHLORIDE: 106 mmol/L (ref 101–111)
CO2: 20 mmol/L — ABNORMAL LOW (ref 22–32)
Calcium: 8.4 mg/dL — ABNORMAL LOW (ref 8.9–10.3)
Creatinine, Ser: 0.92 mg/dL (ref 0.44–1.00)
GFR calc Af Amer: >60 mL/min (ref >60)
GLUCOSE: 366 mg/dL — AB (ref 65–99)
POTASSIUM: 4.2 mmol/L (ref 3.5–5.1)
Sodium: 136 mmol/L (ref 135–145)

## 2016-02-13 LAB — CULTURE, BLOOD (ROUTINE X 2)
CULTURE: NO GROWTH
CULTURE: NO GROWTH

## 2016-02-13 LAB — PREPARE RBC (CROSSMATCH)

## 2016-02-13 LAB — C DIFFICILE QUICK SCREEN W PCR REFLEX
C DIFFICILE (CDIFF) INTERP: NEGATIVE
C DIFFICLE (CDIFF) ANTIGEN: NEGATIVE
C Diff toxin: NEGATIVE

## 2016-02-13 LAB — CBC
HCT: 24.2 % — ABNORMAL LOW (ref 36.0–46.0)
HEMOGLOBIN: 7.5 g/dL — AB (ref 12.0–15.0)
MCH: 23.9 pg — ABNORMAL LOW (ref 26.0–34.0)
MCHC: 31 g/dL (ref 30.0–36.0)
MCV: 77.1 fL — AB (ref 78.0–100.0)
PLATELETS: 157 10*3/uL (ref 150–400)
RBC: 3.14 MIL/uL — AB (ref 3.87–5.11)
RDW: 18 % — ABNORMAL HIGH (ref 11.5–15.5)
WBC: 4.6 10*3/uL (ref 4.0–10.5)

## 2016-02-13 LAB — HEPARIN LEVEL (UNFRACTIONATED): Heparin Unfractionated: 0.75 IU/mL — ABNORMAL HIGH (ref 0.30–0.70)

## 2016-02-13 LAB — PROTIME-INR
INR: 1.11 (ref 0.00–1.49)
PROTHROMBIN TIME: 14.5 s (ref 11.6–15.2)

## 2016-02-13 MED ORDER — ENOXAPARIN SODIUM 80 MG/0.8ML ~~LOC~~ SOLN
70.0000 mg | Freq: Two times a day (BID) | SUBCUTANEOUS | Status: DC
  Administered 2016-02-13 – 2016-02-14 (×3): 70 mg via SUBCUTANEOUS
  Filled 2016-02-13 (×3): qty 0.8

## 2016-02-13 MED ORDER — INSULIN DETEMIR 100 UNIT/ML ~~LOC~~ SOLN
20.0000 [IU] | Freq: Every day | SUBCUTANEOUS | Status: DC
  Administered 2016-02-13 – 2016-02-14 (×2): 20 [IU] via SUBCUTANEOUS
  Filled 2016-02-13 (×2): qty 0.2

## 2016-02-13 MED ORDER — SODIUM CHLORIDE 0.9 % IV SOLN
Freq: Once | INTRAVENOUS | Status: AC
  Administered 2016-02-13: 16:00:00 via INTRAVENOUS

## 2016-02-13 MED ORDER — WARFARIN SODIUM 3 MG PO TABS
3.0000 mg | ORAL_TABLET | Freq: Once | ORAL | Status: AC
  Administered 2016-02-13: 3 mg via ORAL
  Filled 2016-02-13: qty 1

## 2016-02-13 MED ORDER — LOPERAMIDE HCL 2 MG PO CAPS
2.0000 mg | ORAL_CAPSULE | ORAL | Status: DC | PRN
  Administered 2016-02-13: 2 mg via ORAL
  Filled 2016-02-13 (×2): qty 1

## 2016-02-13 MED ORDER — INSULIN ASPART 100 UNIT/ML ~~LOC~~ SOLN
5.0000 [IU] | Freq: Three times a day (TID) | SUBCUTANEOUS | Status: DC
  Administered 2016-02-13 – 2016-02-14 (×4): 5 [IU] via SUBCUTANEOUS

## 2016-02-13 NOTE — Progress Notes (Signed)
Per Benefit check  ENOXAPARIN 80 MG FOR 6 SYRINGES    COVER- YES  CO-PAY-$ 1.20  TIER- 4 DRUG  PRIOR APPROVAL- NO  PHARMACY : [facility]   PATIENT ALSO HAS MEDICAID CO-PAY- $3.60

## 2016-02-13 NOTE — Progress Notes (Signed)
T RH Progress Note                                            Patient Demographics:    Madison [name], is a 55 y.o. female, DOB - 17-Aug-1961, AXK:[id number]  Admit date - 02/08/2016   Admitting Physician Roslynn Amble, MD  Outpatient Primary MD for the patient is [name], NP   Subjective:  Feels better, wants to eat   Assessment  & Plan :   PCCM Transfer to [facility] 3/26:  1-DKA, -DKA resolved, multiple admissions for DKA -followed by Dr. 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 hx of CAD -continue aspirin and statins   7-depression  -continue lexapro  8-hypokalemia: -resolved, repleted   9-GERD:  -continue PPI  10-HTN -stable, lisinopril on hold  11-hx of CVA: with mild left residual deficit  -continue warfarin  Code Status : FUll Code Family Communication  : none at bedside Disposition Plan  : home when INR therpaeutic  Consults  :  PCCM  DVT Prophylaxis  : heparin/warfarin  Lab Results  Component Value Date   PLT 157 02/13/2016    Antibiotics  : Vanc/zosyn  Anti-infectives    Start     Dose/Rate Route Frequency Ordered Stop   02/10/16 1200  vancomycin (VANCOCIN) IVPB 750 mg/150 ml premix  Status:  Discontinued     750 mg 150 mL/hr over 60  Minutes Intravenous Every 12 hours 02/10/16 1151 02/12/16 0751   02/09/16 1500  vancomycin (VANCOCIN) IVPB 750 mg/150 ml premix  Status:  Discontinued     750 mg 150 mL/hr over 60 Minutes Intravenous Every 24 hours 02/08/16 1411 02/10/16 1151   02/08/16 2230  piperacillin-tazobactam (ZOSYN) IVPB 3.375 g  Status:  Discontinued     3.375 g 12.5 mL/hr over 240 Minutes Intravenous Every 8 hours 02/08/16 1411 02/12/16 0751   02/08/16 1415  piperacillin-tazobactam (ZOSYN) IVPB 3.375 g     3.375 g 100 mL/hr over 30 Minutes Intravenous  Once 02/08/16 1407 02/08/16 1459   02/08/16 1415  vancomycin (VANCOCIN) 1,500 mg in sodium chloride 0.9 % 500 mL IVPB     1,500 mg 250 mL/hr over 120 Minutes Intravenous  Once 02/08/16 1407 02/08/16 1705        Objective:   Filed Vitals:   02/12/16 1233 02/12/16 2111 02/13/16 0457 02/13/16 0627  BP: 115/38 129/50 149/68   Pulse: 81 78 87   Temp: 99.3 F (37.4 C) 97.5 F (36.4 C) 98.5 F (36.9 C)   TempSrc: Oral Oral Oral   Resp: 18 18 18    Height:      Weight:    69.5 kg (153 lb 3.5 oz)  SpO2: 100% 100% 100%     Wt Readings from Last 3 Encounters:  02/13/16 69.5  kg (153 lb 3.5 oz)  02/06/16 65.227 kg (143 lb 12.8 oz)  01/30/16 72.2 kg (159 lb 2.8 oz)     Intake/Output Summary (Last 24 hours) at 02/13/16 0928 Last data filed at 02/13/16 0751  Gross per 24 hour  Intake    246 ml  Output      0 ml  Net    246 ml     Physical Exam  Awake Alert, Oriented X 3, No new F.N deficits, Normal affect .AT,PERRAL Supple Neck,No JVD, No cervical lymphadenopathy appriciated.  Symmetrical Chest wall movement, Good air movement bilaterally, CTAB RRR,No Gallops,Rubs or new Murmurs, No Parasternal Heave +ve B.Sounds, Abd Soft, No tenderness, No organomegaly appriciated, No rebound - guarding or rigidity. No Cyanosis, Clubbing or edema, No new Rash or bruise      Data Review:    CBC  Recent Labs Lab 02/06/16 1030 02/08/16 1141  02/09/16 0400  02/10/16 0600 02/11/16 0217 02/12/16 0420 02/13/16 0515  WBC 12.7* 26.2*  < > 16.2* 13.4* 10.4 5.4 4.6  HGB 10.4* 11.0*  < > 8.0* 7.0* 7.2* 7.4* 7.5*  HCT 32.5* 36.8  < > 25.5* 22.2* 24.0* 23.9* 24.2*  PLT 502.0* 593*  < > 328 212 223 165 157  MCV 77.8* 84.2  < > 78.5 77.1* 77.7* 78.1 77.1*  MCH  --  25.2*  < > 24.6* 24.3* 23.3* 24.2* 23.9*  MCHC 32.2 29.9*  < > 31.4 31.5 30.0 31.0 31.0  RDW 20.0* 20.4*  < > 17.9* 18.4* 18.5* 18.4* 18.0*  LYMPHSABS 3.3 5.2*  --   --   --   --   --   --   MONOABS 0.6 1.6*  --   --   --   --   --   --   EOSABS 0.0 0.0  --   --   --   --   --   --   BASOSABS 0.1 0.0  --   --   --   --   --   --   < > = values in this interval not displayed.  Chemistries   Recent Labs Lab 02/08/16 1141 02/08/16 1337  02/09/16 0400  02/10/16 0010 02/10/16 0600 02/11/16 0217 02/12/16 0420 02/13/16 0515  NA 131* 138  < > 150*  < > 146* 143 146* 140 136  K >7.5* 7.2*  < > 4.0  < > 3.0* 3.3* 3.8 4.3 4.2  CL 97* 103  < > 120*  < > 118* 116* 117* 113* 106  CO2 <7* <7*  < > 15*  < > 19* 18* 23 22 20*  GLUCOSE 1300* 1194*  < > 205*  < > 125* 115* 52* 245* 366*  BUN 21* 24*  < > 11  < > 5* <5* <5* 8 9  CREATININE 2.74* 2.42*  < > 1.54*  < > 0.85 0.83 0.69 0.84 0.92  CALCIUM 8.8* 7.7*  < > 7.7*  < > 7.2* 7.2* 8.1* 7.8* 8.4*  MG  --  2.5*  --  1.5*  --   --  1.4* 2.3  --   --   AST 22  --   --   --   --   --   --   --  19  --   ALT 15  --   --   --   --   --   --   --  21  --   ALKPHOS 114  --   --   --   --   --   --   --  83  --   BILITOT 0.9  --   --   --   --   --   --   --  0.7  --   < > = values in this interval not displayed. ------------------------------------------------------------------------------------------------------------------ No results for input(s): CHOL, HDL, LDLCALC, TRIG, CHOLHDL, LDLDIRECT in the last 72 hours.  Lab Results  Component Value Date   HGBA1C 9.9* 01/28/2016    ------------------------------------------------------------------------------------------------------------------  Recent Labs  02/11/16 1112  TSH 1.649   ------------------------------------------------------------------------------------------------------------------  Recent Labs  02/12/16 1219  VITAMINB12 510  FOLATE 9.3  FERRITIN 69  TIBC 288  IRON 77  RETICCTPCT 1.1    Coagulation profile  Recent Labs Lab 02/09/16 1243 02/10/16 0600 02/11/16 0217 02/12/16 0431 02/13/16 0515  INR 5.37* 1.97* 1.52* 1.21 1.11    No results for input(s): DDIMER in the last 72 hours.  Cardiac Enzymes No results for input(s): CKMB, TROPONINI, MYOGLOBIN in the last 168 hours.  Invalid input(s): CK ------------------------------------------------------------------------------------------------------------------    Component Value Date/Time   BNP 398.4* 07/30/2015 1400    Inpatient Medications  Scheduled Meds: . sodium chloride   Intravenous Once  . insulin aspart  0-9 Units Subcutaneous TID WC  . insulin aspart  5 Units Subcutaneous TID WC  . insulin detemir  20 Units Subcutaneous Daily  . pantoprazole  40 mg Oral Daily  . pravastatin  40 mg Oral q1800  . sodium chloride flush  10-40 mL Intracatheter Q12H  . Warfarin - Pharmacist Dosing Inpatient   Does not apply q1800   Continuous Infusions:   PRN Meds:.loperamide, LORazepam  Micro Results Recent Results (from the past 240 hour(s))  Culture, blood (routine x 2)     Status: None (Preliminary result)   Collection Time: 02/08/16  1:08 PM  Result Value Ref Range Status   Specimen Description BLOOD RIGHT HAND  Final   Special Requests BOTTLES DRAWN AEROBIC ONLY 5CC  Final   Culture NO GROWTH 4 DAYS  Final   Report Status PENDING  Incomplete  Culture, blood (routine x 2)     Status: None (Preliminary result)   Collection Time: 02/08/16  1:08 PM  Result Value Ref Range Status   Specimen Description BLOOD RIGHT  ANTECUBITAL  Final   Special Requests BOTTLES DRAWN AEROBIC ONLY 7CC  Final   Culture NO GROWTH 4 DAYS  Final   Report Status PENDING  Incomplete  Urine culture     Status: None   Collection Time: 02/08/16  4:16 PM  Result Value Ref Range Status   Specimen Description URINE, CATHETERIZED  Final   Special Requests NONE  Final   Culture NO GROWTH 2 DAYS  Final   Report Status 02/10/2016 FINAL  Final  MRSA PCR Screening     Status: None   Collection Time: 02/08/16  4:27 PM  Result Value Ref Range Status   MRSA by PCR NEGATIVE NEGATIVE Final    Comment:        The GeneXpert MRSA Assay (FDA approved for NASAL specimens only), is one component of a comprehensive MRSA colonization surveillance program. It is not intended to diagnose MRSA infection nor to guide or monitor treatment for MRSA infections.   C difficile quick scan w PCR reflex     Status: None   Collection Time: 02/13/16  7:33 AM  Result Value Ref Range Status   C Diff antigen NEGATIVE NEGATIVE Final   C Diff toxin NEGATIVE NEGATIVE Final   C Diff interpretation Negative for toxigenic  C. difficile  Final    Radiology Reports Ct Abdomen Pelvis Wo Contrast  02/09/2016  CLINICAL DATA:  Vomiting and lactic acidosis. Diabetic. On Coumadin. EXAM: CT ABDOMEN AND PELVIS WITHOUT CONTRAST TECHNIQUE: Multidetector CT imaging of the abdomen and pelvis was performed following the standard protocol without IV contrast. COMPARISON:  KUB 11/23/2015 FINDINGS: Lower chest: Minimal pleural effusion bilaterally with minimal bibasilar atelectasis. Negative for pneumonia. Aortic valve replacement. Hepatobiliary: Unenhanced images of the liver show normal liver size. No focal liver lesion. Small layering calcified gallstones without gallbladder wall thickening. Bile ducts nondilated. Pancreas: Negative Spleen: Negative Adrenals/Urinary Tract: No renal mass or obstruction on unenhanced imaging. Extensive vascular calcification of the renal arteries.  Urinary bladder is normal with a Foley catheter. Stomach/Bowel: Negative for bowel obstruction. No bowel edema or mass. Negative for diverticulitis. Appendix not visualized. Vascular/Lymphatic: Extensive vascular calcification due to diabetes. No aortic aneurysm. Reproductive: Calcified uterine fibroids. The largest fibroid is posterior fundus measuring 26 mm. No adnexal mass. Other: No free fluid.  Small umbilical hernia. Musculoskeletal: Negative IMPRESSION: No acute abnormality in the abdomen Cholelithiasis. Electronically Signed   By: Marlan Palau M.D.   On: 02/09/2016 14:13   Ct Head Wo Contrast  02/09/2016  CLINICAL DATA:  55 year old female with confusion, vomiting, lactic acidosis. Initial encounter. EXAM: CT HEAD WITHOUT CONTRAST TECHNIQUE: Contiguous axial images were obtained from the base of the skull through the vertex without intravenous contrast. COMPARISON:  Head CT without contrast 07/30/2015 and earlier FINDINGS: Small volume retained secretions in the nasopharynx. Mild ethmoid sinus mucosal thickening is new. Mastoids are clear. Tympanic cavities are clear. No acute osseous abnormality identified. No acute orbit soft tissue findings. Broad-based posterior scalp soft tissue swelling measuring up to 5 mm in thickness, most resembling hematoma (series 3, image 71). Underlying calvarium appears intact. Calcified atherosclerosis at the skull base. Encephalomalacia related to large chronic right MCA territory infarcts. Chronic left greater than right PCA and superior left MCA chronic infarcts small chronic left greater than right cerebellar infarcts. Stable cerebral volume. No ventriculomegaly. No midline shift, mass effect, or evidence of intracranial mass lesion. No acute intracranial hemorrhage identified. No cortically based acute infarct identified. No suspicious intracranial vascular hyperdensity. IMPRESSION: 1. Stable non contrast CT appearance of the brain. Advanced chronic ischemic disease  with widespread encephalomalacia. 2. Broad-based posterior scalp soft tissue thickening most resembling scalp hematoma. No underlying fracture. Electronically Signed   By: Odessa Fleming M.D.   On: 02/09/2016 14:11   Dg Chest Port 1 View  02/11/2016  CLINICAL DATA:  Confusion EXAM: PORTABLE CHEST 1 VIEW COMPARISON:  02/08/2016 FINDINGS: Cardiomediastinal silhouette is stable. Status post median sternotomy and aortic valve replacement. Right IJ catheter with tip in right atrium. No infiltrate or pulmonary edema. No pneumothorax. IMPRESSION: No active disease. Right IJ catheter with tip in right atrium. Status post median sternotomy. Electronically Signed   By: Natasha Mead M.D.   On: 02/11/2016 12:57   Dg Chest Port 1 View  02/08/2016  CLINICAL DATA:  Central line placement. EXAM: PORTABLE CHEST 1 VIEW COMPARISON:  Portable film earlier today. FINDINGS: RIGHT IJ catheter tip lies in the proximal RIGHT atrium. No pneumothorax. Unchanged aeration. Prior median sternotomy. IMPRESSION: Satisfactory appearance status post RIGHT IJ catheter placement. Tip lies in the proximal RIGHT atrium. Electronically Signed   By: Elsie Stain M.D.   On: 02/08/2016 13:09   Dg Chest Portable 1 View  02/08/2016  CLINICAL DATA:  55 year old female with a history of shortness  of breath and weakness EXAM: PORTABLE CHEST 1 VIEW COMPARISON:  Multiple prior chest x-ray comparison, most recent 01/27/2016 FINDINGS: Cardiomediastinal silhouette unchanged in size and contour. Surgical changes of prior median sternotomy and CABG. There is uplifting of the minor fissure with peritracheal/paramediastinal density superior to the right mainstem. This is more pronounced than on the comparison plain film. No left-sided confluent airspace disease. No large pleural effusion. No pneumothorax. IMPRESSION: Uplifting of the minor fissure with density along the paramediastinal stripe, concerning for partial volume loss of the right upper lobe. This may reflect  mucous plugging in the setting of infection, however, central obstructing mass cannot be excluded. Recommend further evaluation with contrast-enhanced CT. Surgical changes of median sternotomy and CABG. Signed, Yvone Neu. Loreta Ave, DO [facility] Radiology Electronically Signed   By: Gilmer Mor D.O.   On: 02/08/2016 12:18   Dg Chest Portable 1 View  01/27/2016  CLINICAL DATA:  Diabetic ketoacidosis.  Confusion and tachycardia. EXAM: PORTABLE CHEST 1 VIEW COMPARISON:  11/23/2015 FINDINGS: Patient is post median sternotomy. There is a prosthetic aortic valve. Heart is normal in size. No pulmonary edema, consolidation, pleural effusion or pneumothorax. IMPRESSION: No acute process.  Post median sternotomy. Electronically Signed   By: Rubye Oaks M.D.   On: 01/27/2016 03:25    Time Spent in minutes    Wilmont Olund M.D on 02/13/2016 at 9:28 AM  Between 7am to 7pm - Pager - [phone]  After 7pm go to [email or web address] - password [employer]  [facility] -  Office  [phone]

## 2016-02-13 NOTE — Progress Notes (Addendum)
Physical Therapy Treatment Patient Details Name: Madison [name] MRN: [id number] DOB: 03/03/1961 Today's Date: 02/24/16    History of Present Illness Pt adm with DKA. PMH - Difficult to control DKA, Rt CVA    PT Comments    Pt much improved. Feel she could benefit from ST-SNF but pt refusing this option and says her son can assist her at home. Therefore recommend HHPT.  Follow Up Recommendations  Home health PT;Supervision for mobility/OOB (Pt refusing SNF.)     Equipment Recommendations  None recommended by PT    Recommendations for Other Services       Precautions / Restrictions Precautions Precautions: Fall Restrictions Weight Bearing Restrictions: No    Mobility  Bed Mobility Overal bed mobility: Needs Assistance Bed Mobility: Supine to Sit     Supine to sit: Supervision     General bed mobility comments: For safety  Transfers Overall transfer level: Needs assistance   Transfers: Sit to/from Stand;Stand Pivot Transfers Sit to Stand: Min assist Stand pivot transfers: Min assist       General transfer comment: Assist for balance  Ambulation/Gait Ambulation/Gait assistance: Min guard Ambulation Distance (Feet): 125 Feet Assistive device: None Gait Pattern/deviations: Step-through pattern;Decreased dorsiflexion - left;Decreased step length - left Gait velocity: decr Gait velocity interpretation: Below normal speed for age/gender General Gait Details: Unsteady gait with assist for balance   Stairs            Wheelchair Mobility    Modified Rankin (Stroke Patients Only)       Balance Overall balance assessment: Needs assistance Sitting-balance support: No upper extremity supported;Feet supported Sitting balance-Leahy Scale: Good     Standing balance support: No upper extremity supported;During functional activity Standing balance-Leahy Scale: Fair                      Cognition Arousal/Alertness: Awake/alert Behavior During  Therapy: WFL for tasks assessed/performed Overall Cognitive Status: Impaired/Different from baseline Area of Impairment: Safety/judgement;Awareness;Problem solving     Memory: Decreased short-term memory   Safety/Judgement: Decreased awareness of safety;Decreased awareness of deficits   Problem Solving: Slow processing;Difficulty sequencing;Requires verbal cues;Requires tactile cues;Decreased initiation      Exercises      General Comments        Pertinent Vitals/Pain Pain Assessment: No/denies pain    Home Living                      Prior Function            PT Goals (current goals can now be found in the care plan section) Acute Rehab PT Goals PT Goal Formulation: With patient Time For Goal Achievement: 02/20/16 Potential to Achieve Goals: Good Progress towards PT goals: Goals met and updated - see care plan    Frequency  Min 3X/week    PT Plan Current plan remains appropriate;Frequency needs to be updated    Co-evaluation             End of Session Equipment Utilized During Treatment: Gait belt Activity Tolerance: Patient tolerated treatment well Patient left: with call bell/phone within reach;in chair;with chair alarm set     Time: 1105-1130 PT Time Calculation (min) (ACUTE ONLY): 25 min  Charges:  $Gait Training: 23-37 mins                    G Codes:      Caylee Vlachos 02-24-16, 1:37 PM [employer] PT [phone]

## 2016-02-13 NOTE — Progress Notes (Signed)
CSW met with pt to discuss PT recommendation for SNF  Pt is not agreeable to going to SNF and feels as if she has improved since her PT evaluation  Pt states she lives at home with her son who can assist her when she is stable to leave the hospital  CSW informed RNCM of pt decision  CSW signing off  Domenica Reamer, [place] Worker [phone]

## 2016-02-13 NOTE — Consult Note (Signed)
   [facility] CM Inpatient Consult   02/13/2016  TAMBER BURTCH Feb 24, 1961 [id number]   Patient has been active with [facility] Care Management. However, [facility] RNCM has been unable to reach patient at home. Went to bedside to discuss. Ms. Ertel reports her cell phone "died and I now have a new home number". She reports her home number is [phone]. However, it does not have voicemail capability. Called her sister, with patient's permission, [name] to confirm patient's home number. [name] confirms home number is [phone]. Ms. Balthazar states she is not going to SNF but home again. States her son is back from [place] and he now lives with her. She states her sister, Tracy,helps her with her son when she is in the hospital and not well. [name] [phone] is an alternative contact person if unable to reach patient. Patient also states she has [facility]. Will need therapy added to home health as well. Spoke with inpatient RNCM about all of the above. Also spoke with [facility] RNCM about patient's new number. Will request for patient to be assigned again as she was discharged due to inability to contact.  Raiford Noble, MSN-Ed, RN,BSN [facility] Liaison [phone]

## 2016-02-13 NOTE — Progress Notes (Signed)
ANTICOAGULATION CONSULT NOTE - Follow Up Consult  Pharmacy Consult for Lovenox and Coumadin Indication: AVR  Allergies  Allergen Reactions  . Adhesive [Tape] Other (See Comments)    Can burn skin if left on too long  . Celebrex [Celecoxib] Rash  . Detrol [Tolterodine] Hives    Patient Measurements: Height: 5\' 4"  (162.6 cm) Weight: 153 lb 3.5 oz (69.5 kg) IBW/kg (Calculated) : 54.7 Heparin Dosing Weight:   Vital Signs: Temp: 98.5 F (36.9 C) (03/27 0457) Temp Source: Oral (03/27 0457) BP: 149/68 mmHg (03/27 0457) Pulse Rate: 87 (03/27 0457)  Labs:  Recent Labs  02/11/16 0217 02/11/16 0932 02/12/16 0420 02/12/16 0431 02/13/16 0515 02/13/16 0806  HGB 7.2*  --  7.4*  --  7.5*  --   HCT 24.0*  --  23.9*  --  24.2*  --   PLT 223  --  165  --  157  --   LABPROT 18.4*  --   --  15.5* 14.5  --   INR 1.52*  --   --  1.21 1.11  --   HEPARINUNFRC 0.38 0.53  --  0.45  --  0.75*  CREATININE 0.69  --  0.84  --  0.92  --     Estimated Creatinine Clearance: 66.1 mL/min (by C-G formula based on Cr of 0.92).   Medications:  Scheduled:  . sodium chloride   Intravenous Once  . insulin aspart  0-9 Units Subcutaneous TID WC  . insulin aspart  5 Units Subcutaneous TID WC  . insulin detemir  20 Units Subcutaneous Daily  . pantoprazole  40 mg Oral Daily  . pravastatin  40 mg Oral q1800  . sodium chloride flush  10-40 mL Intracatheter Q12H  . Warfarin - Pharmacist Dosing Inpatient   Does not apply q1800    Assessment: 55yo female who has been on heparin bridge to Coumadin, now to change to Lovenox bridge.  INR supratherapeutic on admission; Coumadin resumed 3/26.  INR 1.11, Hg 7.5- stable, pltc 157.  No bleeding noted.  Pltc is decreased on heparin, however thrombocytotic on admit with change from just 212 since Heparin was initiated.  Goal of Therapy:  INR 2.5-3.5 (per [facility] clinic goal) Monitor platelets by anticoagulation protocol: Yes   Plan:  Coumadin 3mg  D/C heparin and  all Heparin labs Lovenox 70mg  SQ q12 Watch for s/s of bleeding Change CBC to q72 Watch pltc  [facility], PharmD Clinical Pharmacist [facility]

## 2016-02-13 NOTE — Progress Notes (Signed)
Inpatient Diabetes Program Recommendations  AACE/ADA: New Consensus Statement on Inpatient Glycemic Control (2015)  Target Ranges:  Prepandial:   less than 140 mg/dL      Peak postprandial:   less than 180 mg/dL (1-2 hours)      Critically ill patients:  140 - 180 mg/dL   Results for Madison [name], Madison [name] (MRN [id number]) as of 02/13/2016 08:35  Ref. Range 02/12/2016 07:34 02/12/2016 12:30 02/12/2016 16:44 02/12/2016 22:04 02/13/2016 08:12  Glucose-Capillary Latest Ref Range: 65-99 mg/dL 222 (H) 979 (H) 892 (H) 302 (H) 356 (H)   Review of Glycemic Control  Diabetes history: DM Outpatient Diabetes medications: 70/30 14 units QAM, 9 units QPM Current orders for Inpatient glycemic control: Levemir 10 units Daily, Novolog Sensitive + HS scale + Novolog 4 units Meal Coverage  Inpatient Diabetes Program Recommendations: Insulin - Basal: Fasting glucose in the 300 range. Please increase basal insulin closer to home dose basal component of 70/30, Levemir 16 units Daily.  Thanks,  Christena Deem RN, MSN, [facility] Inpatient Diabetes Coordinator Team Pager [phone] (8a-5p)

## 2016-02-13 NOTE — Care Management Important Message (Signed)
Important Message  Patient Details  Name: Madison [name] MRN: [id number] Date of Birth: 10/26/1961   Medicare Important Message Given:  Yes    [name], [place] Minister 02/13/2016, 1:05 PM

## 2016-02-14 DIAGNOSIS — R1314 Dysphagia, pharyngoesophageal phase: Secondary | ICD-10-CM

## 2016-02-14 DIAGNOSIS — E1011 Type 1 diabetes mellitus with ketoacidosis with coma: Principal | ICD-10-CM

## 2016-02-14 DIAGNOSIS — Z7901 Long term (current) use of anticoagulants: Secondary | ICD-10-CM

## 2016-02-14 DIAGNOSIS — I639 Cerebral infarction, unspecified: Secondary | ICD-10-CM

## 2016-02-14 LAB — GLUCOSE, CAPILLARY
GLUCOSE-CAPILLARY: 214 mg/dL — AB (ref 65–99)
Glucose-Capillary: 193 mg/dL — ABNORMAL HIGH (ref 65–99)
Glucose-Capillary: 72 mg/dL (ref 65–99)

## 2016-02-14 LAB — CBC
HEMATOCRIT: 29.4 % — AB (ref 36.0–46.0)
Hemoglobin: 9.3 g/dL — ABNORMAL LOW (ref 12.0–15.0)
MCH: 24.9 pg — AB (ref 26.0–34.0)
MCHC: 31.6 g/dL (ref 30.0–36.0)
MCV: 78.8 fL (ref 78.0–100.0)
Platelets: 182 10*3/uL (ref 150–400)
RBC: 3.73 MIL/uL — ABNORMAL LOW (ref 3.87–5.11)
RDW: 17.4 % — AB (ref 11.5–15.5)
WBC: 6.4 10*3/uL (ref 4.0–10.5)

## 2016-02-14 LAB — TYPE AND SCREEN
ABO/RH(D): A POS
ANTIBODY SCREEN: NEGATIVE
UNIT DIVISION: 0

## 2016-02-14 LAB — PROTIME-INR
INR: 1.23 (ref 0.00–1.49)
Prothrombin Time: 15.6 seconds — ABNORMAL HIGH (ref 11.6–15.2)

## 2016-02-14 MED ORDER — INSULIN ASPART 100 UNIT/ML ~~LOC~~ SOLN: 5.0000 [IU] | Freq: Three times a day (TID) | SUBCUTANEOUS | Status: DC

## 2016-02-14 MED ORDER — WARFARIN SODIUM 3 MG PO TABS
3.0000 mg | ORAL_TABLET | Freq: Once | ORAL | Status: AC
  Administered 2016-02-14: 3 mg via ORAL
  Filled 2016-02-14: qty 1

## 2016-02-14 MED ORDER — ENOXAPARIN SODIUM 80 MG/0.8ML ~~LOC~~ SOLN: 70.0000 mg | Freq: Two times a day (BID) | SUBCUTANEOUS | Status: DC

## 2016-02-14 MED ORDER — INSULIN DETEMIR 100 UNIT/ML ~~LOC~~ SOLN: 20.0000 [IU] | Freq: Every day | SUBCUTANEOUS | Status: DC

## 2016-02-14 MED ORDER — SODIUM CHLORIDE 0.9% FLUSH
10.0000 mL | INTRAVENOUS | Status: DC | PRN
  Administered 2016-02-14: 20 mL

## 2016-02-14 NOTE — Progress Notes (Signed)
PTAR called to transport patient home, requested after 7pm bc patient has to get her line removed. PTAR stated that there are many calls in front of her and it will be later than 7:00.

## 2016-02-14 NOTE — Progress Notes (Addendum)
Patient discharge teaching given, including activity, diet, follow-up appoints, and medications by Jacquiline Doe. Patient verbalized understanding of all discharge instructions. IV in left arm and right IJ was d/c'd.  Vitals are stable. Skin is intact except as charted in most recent assessments. Pt to be escorted out by EMS, to be driven home.

## 2016-02-14 NOTE — Care Management Note (Addendum)
Case Management Note  Patient Details  Name: Madison [name] MRN: [id number] Date of Birth: May 19, 1961  Subjective/Objective: CM consulted by MD for Pt assistance with Lovenox and Benefit check for Levamir, the latter being a new medication this admission. .Pt reports she has been on Levamir in the past and Benefits check reveals low copay of less than 2.00. Pt is active with [facility] and previous CM has spoken with Tamala Bari to confirm [facility] and HHSW to be resumed at time of discharge.  Bedside RN reports she is allowing pt to administer Lovenox dose this day to assess if assistance is needed. Lovenox co-pay is $1.20.                     Action/Plan:  Anticipate discharge home today with [facility]. Resumption of HHPT/HHOT/HHRN/HHSW. Will have HHRN to draw blood on Friday 02/17/2016 to assess Bleeding time. Follow up appt with PCP at [facility] scheduled for 02/20/2016 @ 0900. Made pt and MD aware of  All of above. No further CM needs.    Expected Discharge Date:  02/17/16               Expected Discharge Plan:  Home w Home Health Services  In-House Referral:     Discharge planning Services  CM Consult  Post Acute Care Choice:  Home Health Choice offered to:  Patient  DME Arranged:    DME Agency:     HH Arranged:  PT, OT, RN, Nurse's Aide, Social Work, Disease Management HH Agency:  [facility], quantity, [facility]  Status of Service:  Completed, signed off  Medicare Important Message Given:  Yes Date Medicare IM Given:    Medicare IM give by:    Date Additional Medicare IM Given:    Additional Medicare Important Message give by:     If discussed at Long Length of Stay Meetings, dates discussed:    Additional Comments:  Yvone Neu, RN 02/14/2016, 10:35 AM

## 2016-02-14 NOTE — Progress Notes (Signed)
ANTICOAGULATION CONSULT NOTE - Follow Up Consult  Pharmacy Consult for Lovenox and Coumadin Indication: AVR  Allergies  Allergen Reactions  . Adhesive [Tape] Other (See Comments)    Can burn skin if left on too long  . Celebrex [Celecoxib] Rash  . Detrol [Tolterodine] Hives    Patient Measurements: Height: 5\' 4"  (162.6 cm) Weight: 151 lb 10.8 oz (68.8 kg) IBW/kg (Calculated) : 54.7 Heparin Dosing Weight:   Vital Signs: Temp: 99.1 F (37.3 C) (03/28 0955) Temp Source: Oral (03/28 0955) BP: 129/65 mmHg (03/28 0955) Pulse Rate: 88 (03/28 0955)  Labs:  Recent Labs  02/12/16 0420 02/12/16 0431 02/13/16 0515 02/13/16 0806 02/14/16 0446  HGB 7.4*  --  7.5*  --  9.3*  HCT 23.9*  --  24.2*  --  29.4*  PLT 165  --  157  --  182  LABPROT  --  15.5* 14.5  --  15.6*  INR  --  1.21 1.11  --  1.23  HEPARINUNFRC  --  0.45  --  0.75*  --   CREATININE 0.84  --  0.92  --   --     Estimated Creatinine Clearance: 65.8 mL/min (by C-G formula based on Cr of 0.92).   Medications:  Scheduled:  . enoxaparin (LOVENOX) injection  70 mg Subcutaneous Q12H  . insulin aspart  0-9 Units Subcutaneous TID WC  . insulin aspart  5 Units Subcutaneous TID WC  . insulin detemir  20 Units Subcutaneous Daily  . pantoprazole  40 mg Oral Daily  . pravastatin  40 mg Oral q1800  . sodium chloride flush  10-40 mL Intracatheter Q12H  . Warfarin - Pharmacist Dosing Inpatient   Does not apply q1800    Assessment: 55yo female who has been on heparin bridge to Coumadin, now to change to Lovenox bridge. INR supratherapeutic on admission; Coumadin resumed 3/26. INR 1.23, Hg improved s/p transfusion 3/27, pltc wnl. No bleeding noted.   Goal of Therapy:  INR 2.5-3.5 (per [facility] goal) Monitor platelets by anticoagulation protocol: Yes   Plan:  Continue Lovenox 70mg  SQ q12 Repeat Coumadin 3mg  today Daily INR Watch for s/s of bleeding   [facility], PharmD Clinical Pharmacist [facility]

## 2016-02-14 NOTE — Progress Notes (Signed)
Attempted to have patient give herself lovenox injection, but pt stated "I can't see without my glasses, but my brother-in-law will be coming soon with my glasses.  I can show you then."  Requested that patient call nurse when ready.

## 2016-02-14 NOTE — Progress Notes (Signed)
Pt demonstrated giving lovenox injection well.  Pt did not have any questions.

## 2016-02-14 NOTE — Discharge Summary (Signed)
Physician Discharge Summary  Madison [name] KGM:[id number] DOB: May 18, 1961 DOA: 02/08/2016  PCP: [name], NP  Admit date: 02/08/2016 Discharge date: 02/14/2016  Time spent: 45 minutes  Recommendations for Outpatient Follow-up:  1. PCP [name] in 1 week, please monitor INR and Hb closely 2. INR check on 3/31, stop lovenox when INR> or equal to 2.5, being discharged on lovenox bridge with warfarin due to subtherpeutic INR   Discharge Diagnoses:    DKA   H/o CVA   Mild cognitive dysfunction   Mechanical aortic valve   Chronic anticoagulation   CVA (cerebral infarction)   Dysphagia, pharyngoesophageal phase   S/P aortic valve replacement   DKA (diabetic ketoacidoses) (HCC)   Diabetic ketoacidosis without coma associated with type 1 diabetes mellitus (HCC)   Diabetic ketoacidosis with coma associated with type 1 diabetes mellitus (HCC)   Lactic acidosis   Discharge Condition: stable  Diet recommendation: diabetic  Filed Weights   02/12/16 0600 02/13/16 0627 02/14/16 0445  Weight: 69.2 kg (152 lb 8.9 oz) 69.5 kg (153 lb 3.5 oz) 68.8 kg (151 lb 10.8 oz)    History of present illness:  This is a 55 year old female w/ known h/o severe and difficult to control DM. Last seen by PCP on 3/20 w/ report of nausea and vomiting. Poor PO intake. Glucose at that time in office was 47; but had been up to 311 the evening before. She was treated w/ zofran and sent home. Little hx is available from that time on; other than EMS was called d/t pt found w/ altered mental status. On arrival CBG to high to measure. On ED arrival w/ bicarb < 7, Ph 6.9. Unresponsive, w/ increased RR  Hospital Course: Admitted to ICU with severe metabolic acidosis and encephalopathy from DKA, transferred to [facility] 3/26  1-DKA, -DKA resolved, multiple admissions for DKA -treated with IVF, Insulin gtt, improved and transitioned to levemir and SSI -followed by Dr. Romero Belling  -takes insulin 70/30 at home, now  stable on levemir, novolog and SSI, she should be able to afford this and hence discharged home on this regimen. -A1C 9.9 from 2 weeks ago  2-SIRS -due to DKA, resolved -all cultures negative, Stopped Abx   3-metallic valve replacement (AVR) -chronically on coumadin -INR subtherapeutic, started on SQ lovenox, could go home on this till INR up to 2.5 -Lovenox teaching completed per RN, Home health RN to draw INR on 3/31, and stop lovenox when INR > or equal to 2.5  4-chronic combined heart failure  -last echo with EF 45-50% and grade 2 diastolic dysfunction  -currently compensated -heart healthy diet   5-Anemia -of chronic disease, no overt bleeding and anemia panel WNL, bili 0.7 so no evidence of hemolysis -s/p 1 unit PRBC  -monitor CBC in 1-2 weeks  6. hx of CAD -continue aspirin and statins   7-depression  -continue lexapro  8-hypokalemia: -resolved, repleted   9-GERD:  -continue PPI  10-HTN -stable, lisinopril on hold  11-hx of CVA: with mild left residual deficit  -continue warfarin  Discharge Exam: Filed Vitals:   02/14/16 0445 02/14/16 0955  BP: 148/63 129/65  Pulse: 73 88  Temp: 98.3 F (36.8 C) 99.1 F (37.3 C)  Resp: 18 20    General: AAOx to self and place, mild cognitive delay Cardiovascular:S1S2/RRR Respiratory: CTAB  Discharge Instructions   Discharge Instructions    AMB Referral to [facility] Care Management    Complete by:  As directed   Please  assign to [facility] RNCM. Previously active but was unable to reach. Confirmed number as [phone]. [name] (sister) [phone] is alternative contact if unable to reach patient. Consent already on file. Please call with questions. Raiford Noble, MSN-Ed, [facility] [phone]  Reason for consult:  Please assign to [facility] RNCM  Expected date of contact:  1-3 days (reserved for hospital discharges)          Current Discharge Medication List     START taking these medications   Details  insulin aspart (NOVOLOG) 100 UNIT/ML injection Inject 5 Units into the skin 3 (three) times daily with meals. Qty: 1 mL, Refills: 0    insulin detemir (LEVEMIR) 100 UNIT/ML injection Inject 0.2 mLs (20 Units total) into the skin daily. Qty: 10 mL, Refills: 0      CONTINUE these medications which have CHANGED   Details  enoxaparin (LOVENOX) 80 MG/0.8ML injection Inject 0.7 mLs (70 mg total) into the skin every 12 (twelve) hours. For 3-4days, STop when INR >2.5 Qty: 7 Syringe, Refills: 0      CONTINUE these medications which have NOT CHANGED   Details  acetaminophen (TYLENOL) 325 MG tablet Take 2 tablets (650 mg total) by mouth every 6 (six) hours as needed for mild pain (temp > 101.5).    escitalopram (LEXAPRO) 20 MG tablet Take 1 tablet (20 mg total) by mouth daily. Qty: 30 tablet, Refills: 6    lisinopril (PRINIVIL,ZESTRIL) 2.5 MG tablet Take 2.5 mg by mouth daily.     omeprazole (PRILOSEC) 40 MG capsule TAKE 1 CAPSULE (40 MG TOTAL) BY MOUTH DAILY. Qty: 90 capsule, Refills: 1    ondansetron (ZOFRAN) 4 MG tablet Take 1 tablet (4 mg total) by mouth every 8 (eight) hours as needed for nausea or vomiting. Qty: 20 tablet, Refills: 1   Associated Diagnoses: Intractable vomiting with nausea, vomiting of unspecified type    pravastatin (PRAVACHOL) 20 MG tablet Take 1 tablet (20 mg total) by mouth daily. Qty: 90 tablet, Refills: 0    solifenacin (VESICARE) 5 MG tablet Take 1 tablet (5 mg total) by mouth daily. Qty: 90 tablet, Refills: 2    traZODone (DESYREL) 50 MG tablet Take 50 mg by mouth at bedtime.  Refills: 0    warfarin (COUMADIN) 7.5 MG tablet Take 1 tablet daily or as directed by coumadin clinic. Qty: 140 tablet, Refills: 0      STOP taking these medications     aspirin 81 MG tablet      insulin aspart protamine - aspart (NOVOLOG MIX 70/30 FLEXPEN) (70-30) 100 UNIT/ML FlexPen        Allergies  Allergen Reactions  .  Adhesive [Tape] Other (See Comments)    Can burn skin if left on too long  . Celebrex [Celecoxib] Rash  . Detrol [Tolterodine] Hives   Follow-up Information    Follow up with [facility].   Specialty:  Home Health Services   Why:  [facility], HHPT, HHOT, [facility] and Social Worker   Contact information:   [address] [place] [address] [phone]       Follow up with [name], NP. Schedule an appointment as soon as possible for a visit in 1 week.   Specialty:  Family Medicine   Contact information:   [address] [place] [address] [phone]        The results of significant diagnostics from this hospitalization (including imaging, microbiology, ancillary  and laboratory) are listed below for reference.    Significant Diagnostic Studies: Ct Abdomen Pelvis Wo Contrast  02/09/2016  CLINICAL DATA:  Vomiting and lactic acidosis. Diabetic. On Coumadin. EXAM: CT ABDOMEN AND PELVIS WITHOUT CONTRAST TECHNIQUE: Multidetector CT imaging of the abdomen and pelvis was performed following the standard protocol without IV contrast. COMPARISON:  KUB 11/23/2015 FINDINGS: Lower chest: Minimal pleural effusion bilaterally with minimal bibasilar atelectasis. Negative for pneumonia. Aortic valve replacement. Hepatobiliary: Unenhanced images of the liver show normal liver size. No focal liver lesion. Small layering calcified gallstones without gallbladder wall thickening. Bile ducts nondilated. Pancreas: Negative Spleen: Negative Adrenals/Urinary Tract: No renal mass or obstruction on unenhanced imaging. Extensive vascular calcification of the renal arteries. Urinary bladder is normal with a Foley catheter. Stomach/Bowel: Negative for bowel obstruction. No bowel edema or mass. Negative for diverticulitis. Appendix not visualized. Vascular/Lymphatic: Extensive vascular calcification due to diabetes. No aortic aneurysm. Reproductive: Calcified uterine  fibroids. The largest fibroid is posterior fundus measuring 26 mm. No adnexal mass. Other: No free fluid.  Small umbilical hernia. Musculoskeletal: Negative IMPRESSION: No acute abnormality in the abdomen Cholelithiasis. Electronically Signed   By: Marlan Palau M.D.   On: 02/09/2016 14:13   Ct Head Wo Contrast  02/09/2016  CLINICAL DATA:  55 year old female with confusion, vomiting, lactic acidosis. Initial encounter. EXAM: CT HEAD WITHOUT CONTRAST TECHNIQUE: Contiguous axial images were obtained from the base of the skull through the vertex without intravenous contrast. COMPARISON:  Head CT without contrast 07/30/2015 and earlier FINDINGS: Small volume retained secretions in the nasopharynx. Mild ethmoid sinus mucosal thickening is new. Mastoids are clear. Tympanic cavities are clear. No acute osseous abnormality identified. No acute orbit soft tissue findings. Broad-based posterior scalp soft tissue swelling measuring up to 5 mm in thickness, most resembling hematoma (series 3, image 71). Underlying calvarium appears intact. Calcified atherosclerosis at the skull base. Encephalomalacia related to large chronic right MCA territory infarcts. Chronic left greater than right PCA and superior left MCA chronic infarcts small chronic left greater than right cerebellar infarcts. Stable cerebral volume. No ventriculomegaly. No midline shift, mass effect, or evidence of intracranial mass lesion. No acute intracranial hemorrhage identified. No cortically based acute infarct identified. No suspicious intracranial vascular hyperdensity. IMPRESSION: 1. Stable non contrast CT appearance of the brain. Advanced chronic ischemic disease with widespread encephalomalacia. 2. Broad-based posterior scalp soft tissue thickening most resembling scalp hematoma. No underlying fracture. Electronically Signed   By: Odessa Fleming M.D.   On: 02/09/2016 14:11   Dg Chest Port 1 View  02/11/2016  CLINICAL DATA:  Confusion EXAM: PORTABLE CHEST 1  VIEW COMPARISON:  02/08/2016 FINDINGS: Cardiomediastinal silhouette is stable. Status post median sternotomy and aortic valve replacement. Right IJ catheter with tip in right atrium. No infiltrate or pulmonary edema. No pneumothorax. IMPRESSION: No active disease. Right IJ catheter with tip in right atrium. Status post median sternotomy. Electronically Signed   By: Natasha Mead M.D.   On: 02/11/2016 12:57   Dg Chest Port 1 View  02/08/2016  CLINICAL DATA:  Central line placement. EXAM: PORTABLE CHEST 1 VIEW COMPARISON:  Portable film earlier today. FINDINGS: RIGHT IJ catheter tip lies in the proximal RIGHT atrium. No pneumothorax. Unchanged aeration. Prior median sternotomy. IMPRESSION: Satisfactory appearance status post RIGHT IJ catheter placement. Tip lies in the proximal RIGHT atrium. Electronically Signed   By: Elsie Stain M.D.   On: 02/08/2016 13:09   Dg Chest Portable 1 View  02/08/2016  CLINICAL DATA:  55 year old female with  a history of shortness of breath and weakness EXAM: PORTABLE CHEST 1 VIEW COMPARISON:  Multiple prior chest x-ray comparison, most recent 01/27/2016 FINDINGS: Cardiomediastinal silhouette unchanged in size and contour. Surgical changes of prior median sternotomy and CABG. There is uplifting of the minor fissure with peritracheal/paramediastinal density superior to the right mainstem. This is more pronounced than on the comparison plain film. No left-sided confluent airspace disease. No large pleural effusion. No pneumothorax. IMPRESSION: Uplifting of the minor fissure with density along the paramediastinal stripe, concerning for partial volume loss of the right upper lobe. This may reflect mucous plugging in the setting of infection, however, central obstructing mass cannot be excluded. Recommend further evaluation with contrast-enhanced CT. Surgical changes of median sternotomy and CABG. Signed, Yvone Neu. Loreta Ave, DO [facility]  Radiology Electronically Signed   By: Gilmer Mor D.O.   On: 02/08/2016 12:18   Dg Chest Portable 1 View  01/27/2016  CLINICAL DATA:  Diabetic ketoacidosis.  Confusion and tachycardia. EXAM: PORTABLE CHEST 1 VIEW COMPARISON:  11/23/2015 FINDINGS: Patient is post median sternotomy. There is a prosthetic aortic valve. Heart is normal in size. No pulmonary edema, consolidation, pleural effusion or pneumothorax. IMPRESSION: No acute process.  Post median sternotomy. Electronically Signed   By: Rubye Oaks M.D.   On: 01/27/2016 03:25    Microbiology: Recent Results (from the past 240 hour(s))  Culture, blood (routine x 2)     Status: None   Collection Time: 02/08/16  1:08 PM  Result Value Ref Range Status   Specimen Description BLOOD RIGHT HAND  Final   Special Requests BOTTLES DRAWN AEROBIC ONLY 5CC  Final   Culture NO GROWTH 5 DAYS  Final   Report Status 02/13/2016 FINAL  Final  Culture, blood (routine x 2)     Status: None   Collection Time: 02/08/16  1:08 PM  Result Value Ref Range Status   Specimen Description BLOOD RIGHT ANTECUBITAL  Final   Special Requests BOTTLES DRAWN AEROBIC ONLY 7CC  Final   Culture NO GROWTH 5 DAYS  Final   Report Status 02/13/2016 FINAL  Final  Urine culture     Status: None   Collection Time: 02/08/16  4:16 PM  Result Value Ref Range Status   Specimen Description URINE, CATHETERIZED  Final   Special Requests NONE  Final   Culture NO GROWTH 2 DAYS  Final   Report Status 02/10/2016 FINAL  Final  MRSA PCR Screening     Status: None   Collection Time: 02/08/16  4:27 PM  Result Value Ref Range Status   MRSA by PCR NEGATIVE NEGATIVE Final    Comment:        The GeneXpert MRSA Assay (FDA approved for NASAL specimens only), is one component of a comprehensive MRSA colonization surveillance program. It is not intended to diagnose MRSA infection nor to guide or monitor treatment for MRSA infections.   C difficile quick scan w PCR reflex     Status:  None   Collection Time: 02/13/16  7:33 AM  Result Value Ref Range Status   C Diff antigen NEGATIVE NEGATIVE Final   C Diff toxin NEGATIVE NEGATIVE Final   C Diff interpretation Negative for toxigenic C. difficile  Final     Labs: Basic Metabolic Panel:  Recent Labs Lab 02/08/16 1337  02/09/16 0400  02/09/16 1421 02/10/16 0010 02/10/16 0600 02/11/16 0217 02/12/16 0420 02/13/16 0515  NA 138  < > 150*  < >  --  146* 143 146* 140 136  K 7.2*  < > 4.0  < >  --  3.0* 3.3* 3.8 4.3 4.2  CL 103  < > 120*  < >  --  118* 116* 117* 113* 106  CO2 <7*  < > 15*  < >  --  19* 18* 23 22 20*  GLUCOSE 1194*  < > 205*  < >  --  125* 115* 52* 245* 366*  BUN 24*  < > 11  < >  --  5* <5* <5* 8 9  CREATININE 2.42*  < > 1.54*  < >  --  0.85 0.83 0.69 0.84 0.92  CALCIUM 7.7*  < > 7.7*  < >  --  7.2* 7.2* 8.1* 7.8* 8.4*  MG 2.5*  --  1.5*  --   --   --  1.4* 2.3  --   --   PHOS 11.5*  --  <1.0*  --  3.3  --  2.0* 1.7*  --   --   < > = values in this interval not displayed. Liver Function Tests:  Recent Labs Lab 02/08/16 1141 02/10/16 0600 02/12/16 0420  AST 22  --  19  ALT 15  --  21  ALKPHOS 114  --  83  BILITOT 0.9  --  0.7  PROT 6.5  --  4.6*  ALBUMIN 3.2* 2.1* 2.0*    Recent Labs Lab 02/08/16 1337  LIPASE 51    Recent Labs Lab 02/11/16 1112  AMMONIA 43*   CBC:  Recent Labs Lab 02/08/16 1141  02/10/16 0600 02/11/16 0217 02/12/16 0420 02/13/16 0515 02/14/16 0446  WBC 26.2*  < > 13.4* 10.4 5.4 4.6 6.4  NEUTROABS 19.4*  --   --   --   --   --   --   HGB 11.0*  < > 7.0* 7.2* 7.4* 7.5* 9.3*  HCT 36.8  < > 22.2* 24.0* 23.9* 24.2* 29.4*  MCV 84.2  < > 77.1* 77.7* 78.1 77.1* 78.8  PLT 593*  < > 212 223 165 157 182  < > = values in this interval not displayed. Cardiac Enzymes: No results for input(s): CKTOTAL, CKMB, CKMBINDEX, TROPONINI in the last 168 hours. BNP: BNP (last 3 results)  Recent Labs  07/30/15 1400  BNP 398.4*    ProBNP (last 3 results) No results  for input(s): PROBNP in the last 8760 hours.  CBG:  Recent Labs Lab 02/13/16 1216 02/13/16 1657 02/13/16 2236 02/14/16 0810 02/14/16 1200  GLUCAP 281* 214* 92 214* 193*       SignedZannie Cove MD.  [facility] 02/14/2016, 2:09 PM

## 2016-02-14 NOTE — Progress Notes (Signed)
Reviewed discharge paperwork with pt.  PIV removed.  Prescriptions given.  Pt awaiting PTAR.  Will continue to monitor until then.

## 2016-02-14 NOTE — Progress Notes (Signed)
Physical Therapy Treatment Patient Details Name: Madison [name] MRN: [id number] DOB: 12-Jan-1961 Today's Date: 02/14/2016    History of Present Illness Pt adm with DKA. PMH - Difficult to control DKA, Rt CVA    PT Comments    Pt tolerated increased gait distance and successful completion of stair training before d/c home.  Pt remains unsteady but reports she is feeling more confident in her abilities to return home.  Pt remains to refuse SNF at d/c.    Follow Up Recommendations  Home health PT;Supervision for mobility/OOB (refusing SNF)     Equipment Recommendations  None recommended by PT    Recommendations for Other Services       Precautions / Restrictions Precautions Precautions: Fall Restrictions Weight Bearing Restrictions: No    Mobility  Bed Mobility Overal bed mobility: Modified Independent Bed Mobility: Supine to Sit;Sit to Supine Rolling: Modified independent (Device/Increase time)   Supine to sit: Modified independent (Device/Increase time) Sit to supine: Modified independent (Device/Increase time)   General bed mobility comments: Pt demonstrates good technique, no assistance needed.    Transfers Overall transfer level: Needs assistance Equipment used: None   Sit to Stand: Supervision Stand pivot transfers: Supervision       General transfer comment: Cues for hand placement, and forward weight shifting to improve technique.  Slow to ascend.    Ambulation/Gait Ambulation/Gait assistance: Min guard Ambulation Distance (Feet): 250 Feet Assistive device: None Gait Pattern/deviations: Step-through pattern;Decreased stride length;Decreased dorsiflexion - left;Decreased weight shift to left Gait velocity: decr   General Gait Details: Unsteady gait with assist for balance.     Stairs Stairs: Yes Stairs assistance: Min guard Stair Management: Step to pattern Number of Stairs: 3 General stair comments: Cues for use of L rail and sequencing to negotiate  3 stairs safely.    Wheelchair Mobility    Modified Rankin (Stroke Patients Only)       Balance Overall balance assessment: Needs assistance Sitting-balance support: No upper extremity supported;Feet supported Sitting balance-Leahy Scale: Good       Standing balance-Leahy Scale: Fair                      Cognition Arousal/Alertness: Awake/alert Behavior During Therapy: WFL for tasks assessed/performed Overall Cognitive Status: Impaired/Different from baseline Area of Impairment: Safety/judgement;Awareness;Problem solving     Memory: Decreased short-term memory   Safety/Judgement: Decreased awareness of safety;Decreased awareness of deficits   Problem Solving: Slow processing;Difficulty sequencing;Requires verbal cues;Requires tactile cues;Decreased initiation      Exercises      General Comments        Pertinent Vitals/Pain Pain Assessment: No/denies pain    Home Living                      Prior Function            PT Goals (current goals can now be found in the care plan section) Acute Rehab PT Goals Patient Stated Goal: to go home Potential to Achieve Goals: Good Progress towards PT goals: Progressing toward goals    Frequency  Min 3X/week    PT Plan Current plan remains appropriate;Frequency needs to be updated    Co-evaluation             End of Session Equipment Utilized During Treatment: Gait belt Activity Tolerance: Patient tolerated treatment well Patient left: in bed;with bed alarm set     Time: 5638-9373 PT Time Calculation (min) (ACUTE ONLY): 16 min  Charges:  $Gait Training: 8-22 mins                    G Codes:      Florestine Avers 02-15-2016, 3:50 PM  Joycelyn Rua, PTA pager [phone]

## 2016-02-15 ENCOUNTER — Other Ambulatory Visit: Payer: Self-pay

## 2016-02-15 DIAGNOSIS — E119 Type 2 diabetes mellitus without complications: Secondary | ICD-10-CM | POA: Diagnosis not present

## 2016-02-16 ENCOUNTER — Other Ambulatory Visit: Payer: Self-pay

## 2016-02-16 ENCOUNTER — Ambulatory Visit: Payer: Commercial Managed Care - HMO

## 2016-02-16 DIAGNOSIS — E119 Type 2 diabetes mellitus without complications: Secondary | ICD-10-CM | POA: Diagnosis not present

## 2016-02-16 NOTE — Progress Notes (Signed)
Unsuccessful attempt made to contact patient via telephone for community care coordination at [phone].  Plan: Make another attempt to contact patient again on March 30 for community care coordination.

## 2016-02-16 NOTE — Patient Outreach (Signed)
Initial telephone contact for transition of care call after discharge from the acute care setting.  Patient identified herself by providing date of birth and address.  Patient and this RNCM agreed to home visit next week for continued care coordination.

## 2016-02-17 ENCOUNTER — Telehealth: Payer: Self-pay | Admitting: Adult Health

## 2016-02-17 DIAGNOSIS — M0600 Rheumatoid arthritis without rheumatoid factor, unspecified site: Secondary | ICD-10-CM | POA: Diagnosis not present

## 2016-02-17 DIAGNOSIS — E119 Type 2 diabetes mellitus without complications: Secondary | ICD-10-CM | POA: Diagnosis not present

## 2016-02-17 DIAGNOSIS — E109 Type 1 diabetes mellitus without complications: Secondary | ICD-10-CM | POA: Diagnosis not present

## 2016-02-17 DIAGNOSIS — I110 Hypertensive heart disease with heart failure: Secondary | ICD-10-CM | POA: Diagnosis not present

## 2016-02-17 DIAGNOSIS — E785 Hyperlipidemia, unspecified: Secondary | ICD-10-CM | POA: Diagnosis not present

## 2016-02-17 DIAGNOSIS — I5042 Chronic combined systolic (congestive) and diastolic (congestive) heart failure: Secondary | ICD-10-CM | POA: Diagnosis not present

## 2016-02-17 DIAGNOSIS — R1314 Dysphagia, pharyngoesophageal phase: Secondary | ICD-10-CM | POA: Diagnosis not present

## 2016-02-17 DIAGNOSIS — I2510 Atherosclerotic heart disease of native coronary artery without angina pectoris: Secondary | ICD-10-CM | POA: Diagnosis not present

## 2016-02-17 DIAGNOSIS — I350 Nonrheumatic aortic (valve) stenosis: Secondary | ICD-10-CM | POA: Diagnosis not present

## 2016-02-17 DIAGNOSIS — D649 Anemia, unspecified: Secondary | ICD-10-CM | POA: Diagnosis not present

## 2016-02-17 NOTE — Telephone Encounter (Signed)
Jinny Sanders, RN from [facility] would like verbal orders for this pt to have skill nursing for 1 for 3wks  once every other week for 4wks and 2 Prn visits.  Verbal order is okay

## 2016-02-17 NOTE — Telephone Encounter (Signed)
Verbal orders given.  Thanks! 

## 2016-02-17 NOTE — Telephone Encounter (Signed)
OK for patient to have skilled nursing as requested

## 2016-02-17 NOTE — Telephone Encounter (Signed)
See below

## 2016-02-18 DIAGNOSIS — E119 Type 2 diabetes mellitus without complications: Secondary | ICD-10-CM | POA: Diagnosis not present

## 2016-02-19 DIAGNOSIS — E119 Type 2 diabetes mellitus without complications: Secondary | ICD-10-CM | POA: Diagnosis not present

## 2016-02-20 ENCOUNTER — Telehealth: Payer: Self-pay | Admitting: General Practice

## 2016-02-20 ENCOUNTER — Ambulatory Visit: Payer: Commercial Managed Care - HMO | Admitting: Adult Health

## 2016-02-20 ENCOUNTER — Ambulatory Visit: Payer: Commercial Managed Care - HMO

## 2016-02-20 DIAGNOSIS — E119 Type 2 diabetes mellitus without complications: Secondary | ICD-10-CM | POA: Diagnosis not present

## 2016-02-20 NOTE — Telephone Encounter (Signed)
Left an order on VM to check patient's INR at next visit and call results to Bailey Mech, RN @ [phone].

## 2016-02-21 ENCOUNTER — Other Ambulatory Visit: Payer: Self-pay

## 2016-02-21 DIAGNOSIS — E119 Type 2 diabetes mellitus without complications: Secondary | ICD-10-CM | POA: Diagnosis not present

## 2016-02-21 DIAGNOSIS — E1159 Type 2 diabetes mellitus with other circulatory complications: Secondary | ICD-10-CM

## 2016-02-21 NOTE — Patient Outreach (Signed)
[facility]) Care Management  02/21/2016  Madison [name] 11-Aug-1961 [id number]     Arrived at patients home for scheduled home visit.  Patient was agreeable to home visit for community care coordination.   Son was taken from her when she was in the hospital by the [facility] DSS. Patient's sister, who lives in [place], is providing care for patient's minor son.  Patient stated her son was suppose to   Patient states she is going to move out of the state because of the neighborhood and the way DSS has not communicated with her regarding guardianship of her young son who has been placed in care of patient's sister who also lives in [place].  Patient admits that her sister does bring patient's son by often to visit with patient.   Call made to [facility] DSS, left message on voicemail of Ms. Miller with request for additional information on the plan for her son's return to her custody.  Patient was given number to [facility] DSS as she stated she was not given any information and did not know who to contact.    Patient and this RNCM collaborated to create her case management care plan which includes Diabetes Education. Patient expresses intent to find another endocrinologist, stated she is waking on there primary care to make a referral.  Member given information on how locate information on area endocrinologist.  Patient also advised to call customer service number on back of her [employer] Member ID Card to get assistance with locating an endocrinologist in network.  Patent reports being active with a local mental health provider with whom she meeting with weekly.  Plan: Make a LCSW referral for psychosocial support Telephone contact next week for assessment of progression on her case management goals.

## 2016-02-22 ENCOUNTER — Telehealth: Payer: Self-pay

## 2016-02-22 DIAGNOSIS — E119 Type 2 diabetes mellitus without complications: Secondary | ICD-10-CM | POA: Diagnosis not present

## 2016-02-22 NOTE — Telephone Encounter (Signed)
Patient had hospital follow up appt on 02/20/16 [facility] came out and saw patient and was unable to make appt. [name] is aware.  Please do not charge patient for missing this appt.

## 2016-02-23 DIAGNOSIS — I350 Nonrheumatic aortic (valve) stenosis: Secondary | ICD-10-CM | POA: Diagnosis not present

## 2016-02-23 DIAGNOSIS — E109 Type 1 diabetes mellitus without complications: Secondary | ICD-10-CM | POA: Diagnosis not present

## 2016-02-23 DIAGNOSIS — I110 Hypertensive heart disease with heart failure: Secondary | ICD-10-CM | POA: Diagnosis not present

## 2016-02-23 DIAGNOSIS — E119 Type 2 diabetes mellitus without complications: Secondary | ICD-10-CM | POA: Diagnosis not present

## 2016-02-23 DIAGNOSIS — I5042 Chronic combined systolic (congestive) and diastolic (congestive) heart failure: Secondary | ICD-10-CM | POA: Diagnosis not present

## 2016-02-23 DIAGNOSIS — I2510 Atherosclerotic heart disease of native coronary artery without angina pectoris: Secondary | ICD-10-CM | POA: Diagnosis not present

## 2016-02-23 DIAGNOSIS — E785 Hyperlipidemia, unspecified: Secondary | ICD-10-CM | POA: Diagnosis not present

## 2016-02-23 DIAGNOSIS — R1314 Dysphagia, pharyngoesophageal phase: Secondary | ICD-10-CM | POA: Diagnosis not present

## 2016-02-23 DIAGNOSIS — M0600 Rheumatoid arthritis without rheumatoid factor, unspecified site: Secondary | ICD-10-CM | POA: Diagnosis not present

## 2016-02-23 DIAGNOSIS — D649 Anemia, unspecified: Secondary | ICD-10-CM | POA: Diagnosis not present

## 2016-02-24 DIAGNOSIS — E119 Type 2 diabetes mellitus without complications: Secondary | ICD-10-CM | POA: Diagnosis not present

## 2016-02-25 DIAGNOSIS — E119 Type 2 diabetes mellitus without complications: Secondary | ICD-10-CM | POA: Diagnosis not present

## 2016-02-26 ENCOUNTER — Inpatient Hospital Stay (HOSPITAL_COMMUNITY)
Admission: EM | Admit: 2016-02-26 | Discharge: 2016-03-01 | DRG: 637 | Disposition: A | Discharge status: Home Health | Payer: Commercial Managed Care - HMO | Attending: Internal Medicine | Admitting: Internal Medicine

## 2016-02-26 ENCOUNTER — Inpatient Hospital Stay (HOSPITAL_COMMUNITY): Payer: Commercial Managed Care - HMO

## 2016-02-26 ENCOUNTER — Encounter (HOSPITAL_COMMUNITY): Payer: Self-pay | Admitting: Emergency Medicine

## 2016-02-26 DIAGNOSIS — I5042 Chronic combined systolic (congestive) and diastolic (congestive) heart failure: Secondary | ICD-10-CM | POA: Diagnosis present

## 2016-02-26 DIAGNOSIS — Z9119 Patient's noncompliance with other medical treatment and regimen: Secondary | ICD-10-CM | POA: Diagnosis not present

## 2016-02-26 DIAGNOSIS — Z794 Long term (current) use of insulin: Secondary | ICD-10-CM

## 2016-02-26 DIAGNOSIS — N179 Acute kidney failure, unspecified: Secondary | ICD-10-CM | POA: Diagnosis present

## 2016-02-26 DIAGNOSIS — D72829 Elevated white blood cell count, unspecified: Secondary | ICD-10-CM | POA: Diagnosis present

## 2016-02-26 DIAGNOSIS — R0602 Shortness of breath: Secondary | ICD-10-CM | POA: Diagnosis not present

## 2016-02-26 DIAGNOSIS — I10 Essential (primary) hypertension: Secondary | ICD-10-CM | POA: Diagnosis not present

## 2016-02-26 DIAGNOSIS — Z8673 Personal history of transient ischemic attack (TIA), and cerebral infarction without residual deficits: Secondary | ICD-10-CM | POA: Diagnosis not present

## 2016-02-26 DIAGNOSIS — G9341 Metabolic encephalopathy: Secondary | ICD-10-CM | POA: Diagnosis not present

## 2016-02-26 DIAGNOSIS — E1065 Type 1 diabetes mellitus with hyperglycemia: Secondary | ICD-10-CM | POA: Diagnosis present

## 2016-02-26 DIAGNOSIS — Z951 Presence of aortocoronary bypass graft: Secondary | ICD-10-CM

## 2016-02-26 DIAGNOSIS — Z7901 Long term (current) use of anticoagulants: Secondary | ICD-10-CM | POA: Diagnosis not present

## 2016-02-26 DIAGNOSIS — D638 Anemia in other chronic diseases classified elsewhere: Secondary | ICD-10-CM | POA: Diagnosis present

## 2016-02-26 DIAGNOSIS — K5900 Constipation, unspecified: Secondary | ICD-10-CM | POA: Diagnosis present

## 2016-02-26 DIAGNOSIS — I255 Ischemic cardiomyopathy: Secondary | ICD-10-CM | POA: Diagnosis present

## 2016-02-26 DIAGNOSIS — E876 Hypokalemia: Secondary | ICD-10-CM | POA: Diagnosis present

## 2016-02-26 DIAGNOSIS — E1011 Type 1 diabetes mellitus with ketoacidosis with coma: Principal | ICD-10-CM | POA: Diagnosis present

## 2016-02-26 DIAGNOSIS — E871 Hypo-osmolality and hyponatremia: Secondary | ICD-10-CM | POA: Diagnosis present

## 2016-02-26 DIAGNOSIS — G9340 Encephalopathy, unspecified: Secondary | ICD-10-CM | POA: Diagnosis not present

## 2016-02-26 DIAGNOSIS — F329 Major depressive disorder, single episode, unspecified: Secondary | ICD-10-CM | POA: Diagnosis present

## 2016-02-26 DIAGNOSIS — E1042 Type 1 diabetes mellitus with diabetic polyneuropathy: Secondary | ICD-10-CM | POA: Diagnosis present

## 2016-02-26 DIAGNOSIS — IMO0002 Reserved for concepts with insufficient information to code with codable children: Secondary | ICD-10-CM | POA: Diagnosis present

## 2016-02-26 DIAGNOSIS — M069 Rheumatoid arthritis, unspecified: Secondary | ICD-10-CM | POA: Diagnosis present

## 2016-02-26 DIAGNOSIS — F419 Anxiety disorder, unspecified: Secondary | ICD-10-CM | POA: Diagnosis present

## 2016-02-26 DIAGNOSIS — Z8249 Family history of ischemic heart disease and other diseases of the circulatory system: Secondary | ICD-10-CM | POA: Diagnosis not present

## 2016-02-26 DIAGNOSIS — I248 Other forms of acute ischemic heart disease: Secondary | ICD-10-CM | POA: Diagnosis not present

## 2016-02-26 DIAGNOSIS — E1110 Type 2 diabetes mellitus with ketoacidosis without coma: Secondary | ICD-10-CM | POA: Diagnosis present

## 2016-02-26 DIAGNOSIS — E1010 Type 1 diabetes mellitus with ketoacidosis without coma: Secondary | ICD-10-CM | POA: Diagnosis not present

## 2016-02-26 DIAGNOSIS — E785 Hyperlipidemia, unspecified: Secondary | ICD-10-CM | POA: Diagnosis present

## 2016-02-26 DIAGNOSIS — D649 Anemia, unspecified: Secondary | ICD-10-CM | POA: Diagnosis present

## 2016-02-26 DIAGNOSIS — Z952 Presence of prosthetic heart valve: Secondary | ICD-10-CM

## 2016-02-26 DIAGNOSIS — I2510 Atherosclerotic heart disease of native coronary artery without angina pectoris: Secondary | ICD-10-CM | POA: Diagnosis present

## 2016-02-26 DIAGNOSIS — E872 Acidosis: Secondary | ICD-10-CM

## 2016-02-26 DIAGNOSIS — E875 Hyperkalemia: Secondary | ICD-10-CM | POA: Diagnosis present

## 2016-02-26 DIAGNOSIS — I350 Nonrheumatic aortic (valve) stenosis: Secondary | ICD-10-CM | POA: Diagnosis not present

## 2016-02-26 DIAGNOSIS — F32A Depression, unspecified: Secondary | ICD-10-CM | POA: Diagnosis present

## 2016-02-26 DIAGNOSIS — I252 Old myocardial infarction: Secondary | ICD-10-CM

## 2016-02-26 DIAGNOSIS — I110 Hypertensive heart disease with heart failure: Secondary | ICD-10-CM | POA: Diagnosis not present

## 2016-02-26 DIAGNOSIS — E869 Volume depletion, unspecified: Secondary | ICD-10-CM | POA: Diagnosis present

## 2016-02-26 DIAGNOSIS — R6510 Systemic inflammatory response syndrome (SIRS) of non-infectious origin without acute organ dysfunction: Secondary | ICD-10-CM

## 2016-02-26 DIAGNOSIS — E860 Dehydration: Secondary | ICD-10-CM | POA: Diagnosis present

## 2016-02-26 DIAGNOSIS — I5032 Chronic diastolic (congestive) heart failure: Secondary | ICD-10-CM | POA: Diagnosis present

## 2016-02-26 DIAGNOSIS — D689 Coagulation defect, unspecified: Secondary | ICD-10-CM | POA: Diagnosis present

## 2016-02-26 DIAGNOSIS — K219 Gastro-esophageal reflux disease without esophagitis: Secondary | ICD-10-CM | POA: Diagnosis present

## 2016-02-26 DIAGNOSIS — E0810 Diabetes mellitus due to underlying condition with ketoacidosis without coma: Secondary | ICD-10-CM | POA: Diagnosis not present

## 2016-02-26 DIAGNOSIS — R4182 Altered mental status, unspecified: Secondary | ICD-10-CM | POA: Diagnosis not present

## 2016-02-26 DIAGNOSIS — R7309 Other abnormal glucose: Secondary | ICD-10-CM | POA: Diagnosis not present

## 2016-02-26 DIAGNOSIS — I5022 Chronic systolic (congestive) heart failure: Secondary | ICD-10-CM | POA: Diagnosis not present

## 2016-02-26 DIAGNOSIS — R739 Hyperglycemia, unspecified: Secondary | ICD-10-CM | POA: Diagnosis not present

## 2016-02-26 LAB — MAGNESIUM
MAGNESIUM: 2.2 mg/dL (ref 1.7–2.4)
Magnesium: 2.1 mg/dL (ref 1.7–2.4)
Magnesium: 1.5 mg/dL — ABNORMAL LOW (ref 1.7–2.4)
Magnesium: 1.6 mg/dL — ABNORMAL LOW (ref 1.7–2.4)

## 2016-02-26 LAB — RAPID URINE DRUG SCREEN, HOSP PERFORMED
Amphetamines: NOT DETECTED
BARBITURATES: NOT DETECTED
Benzodiazepines: NOT DETECTED
COCAINE: NOT DETECTED
Opiates: NOT DETECTED
TETRAHYDROCANNABINOL: NOT DETECTED

## 2016-02-26 LAB — COMPREHENSIVE METABOLIC PANEL
ALBUMIN: 4 g/dL (ref 3.5–5.0)
ALT: 20 U/L (ref 14–54)
AST: 26 U/L (ref 15–41)
Alkaline Phosphatase: 121 U/L (ref 38–126)
BUN: 25 mg/dL — ABNORMAL HIGH (ref 6–20)
CHLORIDE: 90 mmol/L — AB (ref 101–111)
CO2: <7 mmol/L — ABNORMAL LOW (ref 22–32)
Calcium: 9.4 mg/dL (ref 8.9–10.3)
Creatinine, Ser: 1.87 mg/dL — ABNORMAL HIGH (ref 0.44–1.00)
GFR calc non Af Amer: 29 mL/min — ABNORMAL LOW (ref >60)
GFR, EST AFRICAN AMERICAN: 34 mL/min — AB (ref >60)
Glucose, Bld: 1204 mg/dL (ref 65–99)
POTASSIUM: 6.7 mmol/L — AB (ref 3.5–5.1)
SODIUM: 128 mmol/L — AB (ref 135–145)
Total Bilirubin: 1.4 mg/dL — ABNORMAL HIGH (ref 0.3–1.2)
Total Protein: 7.5 g/dL (ref 6.5–8.1)

## 2016-02-26 LAB — URINALYSIS W MICROSCOPIC (NOT AT ARMC)
Bilirubin Urine: NEGATIVE
Glucose, UA: >1000 mg/dL — AB
Ketones, ur: >80 mg/dL — AB
Leukocytes, UA: NEGATIVE
NITRITE: NEGATIVE
PH: 5 (ref 5.0–8.0)
Protein, ur: NEGATIVE mg/dL
SPECIFIC GRAVITY, URINE: 1.021 (ref 1.005–1.030)
WBC, UA: NONE SEEN WBC/hpf (ref 0–5)

## 2016-02-26 LAB — BASIC METABOLIC PANEL
Anion gap: 14 (ref 5–15)
BUN: 23 mg/dL — AB (ref 6–20)
BUN: 22 mg/dL — AB (ref 6–20)
BUN: 16 mg/dL (ref 6–20)
CALCIUM: 8.4 mg/dL — AB (ref 8.9–10.3)
CHLORIDE: 105 mmol/L (ref 101–111)
CHLORIDE: 103 mmol/L (ref 101–111)
CO2: 19 mmol/L — ABNORMAL LOW (ref 22–32)
CREATININE: 1.72 mg/dL — AB (ref 0.44–1.00)
CREATININE: 1.7 mg/dL — AB (ref 0.44–1.00)
CREATININE: 1.13 mg/dL — AB (ref 0.44–1.00)
Calcium: 8.3 mg/dL — ABNORMAL LOW (ref 8.9–10.3)
Calcium: 8.5 mg/dL — ABNORMAL LOW (ref 8.9–10.3)
Chloride: 114 mmol/L — ABNORMAL HIGH (ref 101–111)
GFR calc Af Amer: 37 mL/min — ABNORMAL LOW (ref >60)
GFR calc Af Amer: 38 mL/min — ABNORMAL LOW (ref >60)
GFR calc Af Amer: >60 mL/min (ref >60)
GFR calc non Af Amer: 32 mL/min — ABNORMAL LOW (ref >60)
GFR calc non Af Amer: 33 mL/min — ABNORMAL LOW (ref >60)
GFR, EST NON AFRICAN AMERICAN: 54 mL/min — AB (ref >60)
GLUCOSE: 914 mg/dL — AB (ref 65–99)
GLUCOSE: 176 mg/dL — AB (ref 65–99)
Glucose, Bld: 872 mg/dL (ref 65–99)
Potassium: 6 mmol/L — ABNORMAL HIGH (ref 3.5–5.1)
Potassium: 5.1 mmol/L (ref 3.5–5.1)
Potassium: 3.3 mmol/L — ABNORMAL LOW (ref 3.5–5.1)
Sodium: 138 mmol/L (ref 135–145)
Sodium: 140 mmol/L (ref 135–145)
Sodium: 147 mmol/L — ABNORMAL HIGH (ref 135–145)

## 2016-02-26 LAB — GLUCOSE, CAPILLARY
GLUCOSE-CAPILLARY: 270 mg/dL — AB (ref 65–99)
GLUCOSE-CAPILLARY: 366 mg/dL — AB (ref 65–99)
Glucose-Capillary: >600 mg/dL (ref 65–99)
Glucose-Capillary: 581 mg/dL (ref 65–99)
Glucose-Capillary: 428 mg/dL — ABNORMAL HIGH (ref 65–99)
Glucose-Capillary: 247 mg/dL — ABNORMAL HIGH (ref 65–99)
Glucose-Capillary: 201 mg/dL — ABNORMAL HIGH (ref 65–99)
Glucose-Capillary: 148 mg/dL — ABNORMAL HIGH (ref 65–99)

## 2016-02-26 LAB — ETHANOL: Alcohol, Ethyl (B): <5 mg/dL (ref <5)

## 2016-02-26 LAB — CBC WITH DIFFERENTIAL/PLATELET
BASOS ABS: 0.1 10*3/uL (ref 0.0–0.1)
BASOS PCT: 0 %
EOS ABS: 0 10*3/uL (ref 0.0–0.7)
Eosinophils Relative: 0 %
HCT: 41.7 % (ref 36.0–46.0)
HEMOGLOBIN: 11.1 g/dL — AB (ref 12.0–15.0)
Lymphocytes Relative: 28 %
Lymphs Abs: 4.3 10*3/uL — ABNORMAL HIGH (ref 0.7–4.0)
MCH: 25.6 pg — AB (ref 26.0–34.0)
MCHC: 26.6 g/dL — AB (ref 30.0–36.0)
MCV: 96.1 fL (ref 78.0–100.0)
MONO ABS: 0.5 10*3/uL (ref 0.1–1.0)
Monocytes Relative: 3 %
NEUTROS ABS: 10.3 10*3/uL — AB (ref 1.7–7.7)
NEUTROS PCT: 68 %
PLATELETS: 500 10*3/uL — AB (ref 150–400)
RBC: 4.34 MIL/uL (ref 3.87–5.11)
RDW: 20.2 % — AB (ref 11.5–15.5)
WBC: 15.2 10*3/uL — ABNORMAL HIGH (ref 4.0–10.5)

## 2016-02-26 LAB — CBG MONITORING, ED

## 2016-02-26 LAB — PHOSPHORUS: Phosphorus: <1 mg/dL — CL (ref 2.5–4.6)

## 2016-02-26 LAB — LACTIC ACID, PLASMA
LACTIC ACID, VENOUS: 7.5 mmol/L — AB (ref 0.5–2.0)
LACTIC ACID, VENOUS: 7.6 mmol/L — AB (ref 0.5–2.0)

## 2016-02-26 LAB — ABO/RH: ABO/RH(D): A POS

## 2016-02-26 LAB — TSH: TSH: 0.336 u[IU]/mL — AB (ref 0.350–4.500)

## 2016-02-26 LAB — TYPE AND SCREEN
ABO/RH(D): A POS
Antibody Screen: NEGATIVE

## 2016-02-26 LAB — I-STAT CG4 LACTIC ACID, ED: Lactic Acid, Venous: 10.38 mmol/L (ref 0.5–2.0)

## 2016-02-26 LAB — I-STAT TROPONIN, ED: TROPONIN I, POC: 0.02 ng/mL (ref 0.00–0.08)

## 2016-02-26 LAB — PROTIME-INR
INR: 3.46 — AB (ref 0.00–1.49)
PROTHROMBIN TIME: 33 s — AB (ref 11.6–15.2)

## 2016-02-26 LAB — TROPONIN I: Troponin I: 0.07 ng/mL — ABNORMAL HIGH (ref <0.031)

## 2016-02-26 LAB — SALICYLATE LEVEL

## 2016-02-26 LAB — MRSA PCR SCREENING: MRSA by PCR: NEGATIVE

## 2016-02-26 MED ORDER — STERILE WATER FOR INJECTION IV SOLN
INTRAVENOUS | Status: DC
  Administered 2016-02-26 (×2): via INTRAVENOUS
  Filled 2016-02-26 (×3): qty 850

## 2016-02-26 MED ORDER — SODIUM CHLORIDE 0.9 % IV SOLN
INTRAVENOUS | Status: DC
  Administered 2016-02-26 – 2016-02-27 (×2): via INTRAVENOUS

## 2016-02-26 MED ORDER — PIPERACILLIN-TAZOBACTAM 3.375 G IVPB 30 MIN
3.3750 g | Freq: Once | INTRAVENOUS | Status: AC
  Administered 2016-02-26: 3.375 g via INTRAVENOUS
  Filled 2016-02-26: qty 50

## 2016-02-26 MED ORDER — CETYLPYRIDINIUM CHLORIDE 0.05 % MT LIQD
7.0000 mL | Freq: Two times a day (BID) | OROMUCOSAL | Status: DC
  Administered 2016-02-26 – 2016-02-28 (×5): 7 mL via OROMUCOSAL

## 2016-02-26 MED ORDER — VANCOMYCIN HCL IN DEXTROSE 1-5 GM/200ML-% IV SOLN: 1000.0000 mg | INTRAVENOUS | Status: DC

## 2016-02-26 MED ORDER — SODIUM CHLORIDE 0.9 % IV BOLUS (SEPSIS)
1000.0000 mL | Freq: Once | INTRAVENOUS | Status: AC
  Administered 2016-02-26: 1000 mL via INTRAVENOUS

## 2016-02-26 MED ORDER — DEXTROSE-NACL 5-0.45 % IV SOLN: INTRAVENOUS | Status: DC

## 2016-02-26 MED ORDER — SODIUM BICARBONATE 8.4 % IV SOLN
50.0000 meq | Freq: Once | INTRAVENOUS | Status: AC
  Administered 2016-02-26: 50 meq via INTRAVENOUS
  Filled 2016-02-26: qty 50

## 2016-02-26 MED ORDER — SODIUM CHLORIDE 0.9 % IV BOLUS (SEPSIS)
1000.0000 mL | Freq: Once | INTRAVENOUS | Status: AC
Start: 2016-02-26 — End: 2016-02-26
  Administered 2016-02-26: 1000 mL via INTRAVENOUS

## 2016-02-26 MED ORDER — MAGNESIUM SULFATE 2 GM/50ML IV SOLN
2.0000 g | Freq: Once | INTRAVENOUS | Status: AC
  Administered 2016-02-27: 2 g via INTRAVENOUS
  Filled 2016-02-26: qty 50

## 2016-02-26 MED ORDER — VANCOMYCIN HCL IN DEXTROSE 1-5 GM/200ML-% IV SOLN
1000.0000 mg | Freq: Once | INTRAVENOUS | Status: AC
  Administered 2016-02-26: 1000 mg via INTRAVENOUS
  Filled 2016-02-26: qty 200

## 2016-02-26 MED ORDER — STERILE WATER FOR INJECTION IV SOLN
INTRAVENOUS | Status: DC
  Administered 2016-02-26: 13:00:00 via INTRAVENOUS
  Filled 2016-02-26: qty 850

## 2016-02-26 MED ORDER — POTASSIUM PHOSPHATES 15 MMOLE/5ML IV SOLN
20.0000 mmol | Freq: Once | INTRAVENOUS | Status: AC
  Administered 2016-02-27: 20 mmol via INTRAVENOUS
  Filled 2016-02-26: qty 6.67

## 2016-02-26 MED ORDER — PANTOPRAZOLE SODIUM 40 MG IV SOLR
40.0000 mg | INTRAVENOUS | Status: DC
  Administered 2016-02-26 – 2016-02-27 (×2): 40 mg via INTRAVENOUS
  Filled 2016-02-26 (×2): qty 40

## 2016-02-26 MED ORDER — INSULIN REGULAR HUMAN 100 UNIT/ML IJ SOLN
INTRAMUSCULAR | Status: AC
  Administered 2016-02-26: 5.4 [IU]/h via INTRAVENOUS
  Filled 2016-02-26 (×2): qty 2.5

## 2016-02-26 MED ORDER — SODIUM CHLORIDE 0.9 % IV SOLN
INTRAVENOUS | Status: DC
  Administered 2016-02-26: 5.4 [IU]/h via INTRAVENOUS
  Filled 2016-02-26: qty 2.5

## 2016-02-26 MED ORDER — DEXTROSE-NACL 5-0.45 % IV SOLN
INTRAVENOUS | Status: DC
  Administered 2016-02-26 – 2016-02-27 (×2): via INTRAVENOUS

## 2016-02-26 MED ORDER — ONDANSETRON HCL 4 MG/2ML IJ SOLN
4.0000 mg | Freq: Four times a day (QID) | INTRAMUSCULAR | Status: DC | PRN
  Administered 2016-02-26: 4 mg via INTRAVENOUS
  Filled 2016-02-26 (×2): qty 2

## 2016-02-26 MED ORDER — CHLORHEXIDINE GLUCONATE 0.12 % MT SOLN
15.0000 mL | Freq: Two times a day (BID) | OROMUCOSAL | Status: DC
  Administered 2016-02-26 – 2016-03-01 (×8): 15 mL via OROMUCOSAL
  Filled 2016-02-26 (×7): qty 15

## 2016-02-26 MED ORDER — DEXTROSE 50 % IV SOLN: 25.0000 mL | INTRAVENOUS | Status: DC | PRN

## 2016-02-26 MED ORDER — PIPERACILLIN-TAZOBACTAM 3.375 G IVPB
3.3750 g | Freq: Three times a day (TID) | INTRAVENOUS | Status: DC
  Administered 2016-02-26 – 2016-02-28 (×5): 3.375 g via INTRAVENOUS
  Filled 2016-02-26 (×5): qty 50

## 2016-02-26 MED ORDER — SODIUM CHLORIDE 0.9 % IV SOLN
1.0000 g | Freq: Once | INTRAVENOUS | Status: AC
  Administered 2016-02-26: 1 g via INTRAVENOUS
  Filled 2016-02-26: qty 10

## 2016-02-26 MED ORDER — INSULIN REGULAR BOLUS VIA INFUSION
0.0000 [IU] | Freq: Three times a day (TID) | INTRAVENOUS | Status: AC
  Filled 2016-02-26: qty 10

## 2016-02-26 MED ORDER — SODIUM CHLORIDE 0.9 % IV SOLN: 250.0000 mL | INTRAVENOUS | Status: DC | PRN

## 2016-02-26 NOTE — Progress Notes (Signed)
eLink Physician-Brief Progress Note Patient Name: Madison [name] DOB: 1961-06-09 MRN: [id number]   Date of Service  02/26/2016  HPI/Events of Note  DKA - not serial BMP ordered.    eICU Interventions  Will order BMP, Mg++ and PO4--- now.      Intervention Category Minor Interventions: Clinical assessment - ordering diagnostic tests  Lenell Antu 02/26/2016, 9:39 PM

## 2016-02-26 NOTE — Progress Notes (Addendum)
Pharmacy Antibiotic Note  Madison [name] is a 55 y.o. female admitted on 02/26/2016 with presumed DKA, unresponsive/critically ill, will cover for r/o sepsis.  Recently admitted 3/22-28 for DKA.  Pharmacy has been consulted for Vancomycin and Zosyn dosing.  CMET in process.  First antibiotic doses ordered (Vanc 1g x 1 and Zosyn 3.375g x 1).  Update: patient with AKI, CrCl~36 ml/min  Plan: Vancomycin 1g IV q24h. Zosyn 3.375g IV q8h (4 hour infusion time).      Temp (24hrs), Avg:96.6 F (35.9 C), Min:96.6 F (35.9 C), Max:96.6 F (35.9 C)  No results for input(s): WBC, CREATININE, LATICACIDVEN, VANCOTROUGH, VANCOPEAK, VANCORANDOM, GENTTROUGH, GENTPEAK, GENTRANDOM, TOBRATROUGH, TOBRAPEAK, TOBRARND, AMIKACINPEAK, AMIKACINTROU, AMIKACIN in the last 168 hours.  Estimated Creatinine Clearance: 65.8 mL/min (by C-G formula based on Cr of 0.92).    Allergies  Allergen Reactions  . Adhesive [Tape] Other (See Comments)    Can burn skin if left on too long  . Celebrex [Celecoxib] Rash  . Detrol [Tolterodine] Hives    Antimicrobials this admission: 4/8 Vancomycin >>  4/8 Zosyn >>   Dose adjustments this admission: -  Microbiology results: 4/8 BCx: sent 4/8 UCx: ordered   Thank you for allowing pharmacy to be a part of this patient's care.  Clance Boll 02/26/2016 11:53 AM

## 2016-02-26 NOTE — ED Notes (Signed)
Pt from home via [facility]. Hyperglycemia , labored breathing, not responding to verbal stimuli, eyes open , appears combative and altered mental status. CBG reading high per EMS.

## 2016-02-26 NOTE — Progress Notes (Signed)
Utilization review completed.  

## 2016-02-26 NOTE — Progress Notes (Signed)
CRITICAL VALUE ALERT  Critical value received:  Glucose 914  Date of notification:  02/26/2016  Time of notification:  0340  Critical value read back:Yes.    Nurse who received alert:  Loletta Parish RN   MD notified (1st page):  Dr. Arsenio Loader  Time of first page:  0350  MD notified (2nd page):  Time of second page:  Responding MD:  Dr. Arsenio Loader  Time MD responded:  [id number]

## 2016-02-26 NOTE — Progress Notes (Signed)
Received call from [facility] RN reguarding critical lab value Phos <1 and notified eMD Dr Craige Cotta.

## 2016-02-26 NOTE — Progress Notes (Signed)
PULMONARY / CRITICAL CARE MEDICINE   Name: Madison [name] MRN: [id number] DOB: Feb 24, 1961    ADMISSION DATE:  02/26/2016 CONSULTATION DATE:  02/26/2016  REFERRING MD:  Renne Crigler, PA / EDP  CHIEF COMPLAINT:  DKA  HISTORY OF PRESENT ILLNESS:   Patient presented to emergency department today unresponsive. She was found to be again in DKA. Patient is well-known to me from her previous admission from 3/22 to 3/28 for the same. Patient has had multiple previous admissions for diabetic ketoacidosis. No history of mechanical aortic valve on systemic anticoagulation as well. All cultures from previous admission were negative. In the emergency department here patient was given 2 L IV fluid bolus as well as 1 amp of calcium gluconate. Zosyn has been administered and vancomycin has been ordered. Insulin infusion has been ordered as well. The patient has had an incomplete workup and has had no imaging of her brain or chest either despite being on systemic anticoagulation. Serum lab tests that were ordered by emergency department provider remain pending and have not yet been completed. Despite this we were contacted for admission. Additionally, restraints for ordered for the patient despite her reportedly being nonresponsive. Sister says she spoke with her last night and she was reportedly normal but no one has seen her since last Wednesday. Sister reports she eats more, specifically carbohydrates, than she admits. She is very inactive per her sister's report. Social Services has placed her son in her sister's home. Reportedly she had better control on Levemir.   PAST MEDICAL HISTORY :  Past Medical History  Diagnosis Date  . CAD (coronary artery disease)   . Obesity   . Hypercholesteremia   . HTN (hypertension)   . Dyslipidemia   . Aortic stenosis   . Thrombophlebitis   . Heart murmur   . Myocardial infarction (HCC) 12/2014  . Pneumonia 11/2014; 12/2014  . GERD (gastroesophageal reflux disease)   .  Stroke syndrome [facility]) 1995; 2001    "when my son was born; problems w/speech and L hand since then" (01/19/2015)  . Anxiety   . Depression   . Allergy   . CHF (congestive heart failure) (HCC)   . Rheumatoid arthritis(714.0)     "hands" (07/01/2015)  . Diabetes (HCC) dx'd 1982    "went straight to insulin"    PAST SURGICAL HISTORY: Past Surgical History  Procedure Laterality Date  . Cesarean section  1995  . Foot fracture surgery    . Knee arthroscopy Right   . Neuroplasty / transposition median nerve at carpal tunnel    . Left heart catheterization with coronary angiogram N/A 12/08/2014    Procedure: LEFT HEART CATHETERIZATION WITH CORONARY ANGIOGRAM;  Surgeon: Iran Ouch, MD;  Location: [facility] CATH LAB;  Service: Cardiovascular;  Laterality: N/A;  . Percutaneous coronary stent intervention (pci-s) N/A 12/09/2014    Procedure: PERCUTANEOUS CORONARY STENT INTERVENTION (PCI-S);  Surgeon: [name], MD;  Location: [facility] CATH LAB;  Service: Cardiovascular;  Laterality: N/A;  . Cardiac catheterization  12/08/2014  . Coronary angioplasty with stent placement  12/09/2014  . Aortic valve replacement (avr)/coronary artery bypass grafting (cabg)  "2014"    [name] 03/08/2014  . Fracture surgery    . Tubal ligation  1995  . Cataract extraction Left   . Coronary artery bypass graft    . Orif ankle fracture Left 07/04/2015    Procedure: OPEN REDUCTION INTERNAL FIXATION (ORIF)  TRIMAL ANKLE FRACTURE;  Surgeon: [name], MD;  Location: [facility] OR;  Service: Orthopedics;  Laterality: Left;    Allergies  Allergen Reactions  . Adhesive [Tape] Other (See Comments)    Can burn skin if left on too long  . Celebrex [Celecoxib] Rash  . Detrol [Tolterodine] Hives    No current facility-administered medications on file prior to encounter.   Current Outpatient Prescriptions on File Prior to Encounter  Medication Sig  . acetaminophen (TYLENOL) 325 MG tablet Take 2 tablets (650 mg total) by mouth every  6 (six) hours as needed for mild pain (temp > 101.5).  Marland Kitchen enoxaparin (LOVENOX) 80 MG/0.8ML injection Inject 0.7 mLs (70 mg total) into the skin every 12 (twelve) hours. For 3-4days, STop when INR >2.5  . escitalopram (LEXAPRO) 20 MG tablet Take 1 tablet (20 mg total) by mouth daily.  . insulin aspart (NOVOLOG) 100 UNIT/ML injection Inject 5 Units into the skin 3 (three) times daily with meals.  . insulin detemir (LEVEMIR) 100 UNIT/ML injection Inject 0.2 mLs (20 Units total) into the skin daily.  Marland Kitchen lisinopril (PRINIVIL,ZESTRIL) 2.5 MG tablet Take 2.5 mg by mouth daily.   Marland Kitchen omeprazole (PRILOSEC) 40 MG capsule TAKE 1 CAPSULE (40 MG TOTAL) BY MOUTH DAILY.  Marland Kitchen ondansetron (ZOFRAN) 4 MG tablet Take 1 tablet (4 mg total) by mouth every 8 (eight) hours as needed for nausea or vomiting.  . pravastatin (PRAVACHOL) 20 MG tablet Take 1 tablet (20 mg total) by mouth daily.  . solifenacin (VESICARE) 5 MG tablet Take 1 tablet (5 mg total) by mouth daily.  . traZODone (DESYREL) 50 MG tablet Take 50 mg by mouth at bedtime.   Marland Kitchen warfarin (COUMADIN) 7.5 MG tablet Take 1 tablet daily or as directed by coumadin clinic. (Patient taking differently: Take 7.5 mg by mouth daily at 6 PM. Take 1 tablet daily or as directed by coumadin clinic.)    FAMILY HISTORY:  Family History  Problem Relation Age of Onset  . Stroke    . Heart disease Mother   . Heart disease Sister     SOCIAL HISTORY: Social History  Substance Use Topics  . Smoking status: Never Smoker   . Smokeless tobacco: Never Used  . Alcohol Use: No    REVIEW OF SYSTEMS:  Unable to obtain given altered mentation.  SUBJECTIVE:   VITAL SIGNS: BP 112/98 mmHg  Pulse 117  Temp(Src) 96.6 F (35.9 C) (Rectal)  Resp 28  SpO2 97%  HEMODYNAMICS:    VENTILATOR SETTINGS:    INTAKE / OUTPUT:    PHYSICAL EXAMINATION: General:  Eyes open. No acute distress. No family at bedside.  Integument:  Warm & dry. No rash on exposed skin. No  bruising. Lymphatics:  No appreciated cervical or supraclavicular lymphadenoapthy. HEENT:  Dry mucus membranes. No scleral injection or icterus.  Cardiovascular:  Tachycardic with regular rhythm. No edema. No appreciable JVD.  Pulmonary:  Good aeration & clear to auscultation bilaterally. Symmetric chest wall expansion. Tachypneic.  Abdomen: Soft. Normal bowel sounds. Nondistended.  Musculoskeletal:  Normal bulk and tone. No joint deformity or effusion appreciated. Neurological:  No meningismus. Symmetric deep tendon reflexes. Withdrawals to pain. Doesn't follow commands or answer questions. Psychiatric:  Unable to assess with altered mentation.   LABS:  BMET No results for input(s): NA, K, CL, CO2, BUN, CREATININE, GLUCOSE in the last 168 hours.  Electrolytes No results for input(s): CALCIUM, MG, PHOS in the last 168 hours.  CBC  Recent Labs Lab 02/26/16 1122  WBC 15.2*  HGB 11.1*  HCT 41.7  PLT 500*  Coag's  Recent Labs Lab 02/26/16 1122  INR 3.46*    Sepsis Markers No results for input(s): LATICACIDVEN, PROCALCITON, O2SATVEN in the last 168 hours.  ABG No results for input(s): PHART, PCO2ART, PO2ART in the last 168 hours.  Liver Enzymes No results for input(s): AST, ALT, ALKPHOS, BILITOT, ALBUMIN in the last 168 hours.  Cardiac Enzymes No results for input(s): TROPONINI, PROBNP in the last 168 hours.  Glucose  Recent Labs Lab 02/26/16 1106  GLUCAP >600*    Imaging No results found.   STUDIES:  None.  MICROBIOLOGY: Blood Ctx x2 3/22:  Negative Urine Ctx 3/22:  Negative MRSA PCR 3/22:  Negative   ANTIBIOTICS: Vancomycin 4/9>>> Zosyn 4/9>>>  SIGNIFICANT EVENTS: 3/22 - 3/28 - Admit for DKA 4/09 - Admit for DKA  LINES/TUBES: PIV x2  ASSESSMENT / PLAN:  PULMONARY A: No acute issues.  P:   Monitor continuous pulse ox.  CARDIOVASCULAR A:  Prolonged QTc Mechanical Aortic Valve H/O HTN H/O MI/CAD H/O Systolic CHF (EF 58-85%  07/2015)  P:  Repeat EKG this PM & tomorrow AM Monitor on telemetry Vitals per unit protocol Holding Pravachol & Lisinopril  RENAL A:   Acute Renal Failure - Likely due to dehdyration. Hyperkalemia - Likely secondary to acidosis. S/P Calcium Gluconate in ED. Pseudohyponatremia Metabolic/Ketoacidosis Lactic Acidosis  P:   Trending LA Trending electrolytes q4hr Starting Bicarb gtt at 100cc/hr Placing foley to monitor UOP  GASTROINTESTINAL A:   Hyperbilirubinemia H/O GERD H/O N/V  P:   NPO Protonix IV daily LFTs in AM  HEMATOLOGIC A:   Leukocytosis - Stress response versus sepsis. Anemia - Mild. No signs of active bleeding. Coagulopathy - Chronic anticoagulation for Mechanical Aortic Valve.  P:  Trending Cell Counts daily w/ CBC Trending INR daily  INFECTIOUS A:   SIRS - Possible Sepsis.  P:   Empiric Vancomcyin & Zosyn Day #1 Blood & Urine Cultures pending Checking CT Chest w/o Procalcitonin per algorithm  ENDOCRINE A:   DKA H/O DM Type 1 - Multiple admissions.   P:   Insulin gtt via Glucostabilizer Holding [place] insulin D5 - 1/2NS to maintain blood glucose >700  NEUROLOGIC A:   Acute Encephalopathy H/O Anxiety/Depression  P:   Holding Sedating Medications Neurochecks q2hr Stat CT Head w/o Stat TSH & Free T4 Stat UDS & EtOH level Holding Lexapro & Trazodone   FAMILY  - Updates: [name] via phone 4/9 by Dr. Jamison Neighbor.   - Inter-disciplinary family meet or Palliative Care meeting due by:  4/16  TODAY'S SUMMARY:  55 year old female with Diabetes Mellitus Type 1. Multiple prior admissions for DKA. Patient presenting with altered mental status and diabetic ketoacidosis. Etiology is unclear but could be secondary to over consumption of carbohydrates. Patient is altered at this time with incomplete workup by emergency department. Ordering urine drug screen as well as serum salicylate and ethanol levels. Checking CT scan of the head and chest  without contrast stat. Holding on reverse coagulopathy. Starting bicarbonate drip and continuing IV fluid resuscitation. Trending electrolytes every 4 hours and specific order to start glucose infusion if blood glucose drops less than 700 to prevent excessive correction and central pontine myelolysis.   I have spent a total of 44 minutes of critical care time today caring for the patient, discussing the plan of care with her sister via phone, and reviewing the patient's electronic medical record.  Hermela Christen Jamison Neighbor, M.D. [facility] Pager:  [phone] After 3pm or if  no response, call [phone] 02/26/2016, 12:20 PM

## 2016-02-26 NOTE — Progress Notes (Signed)
eLink Physician-Brief Progress Note Patient Name: NELSIE DOMINO DOB: 01/06/1961 MRN: [id number]   Date of Service  02/26/2016  HPI/Events of Note  K 3.3, Ph < 1, Mg 1.6.  eICU Interventions  Will give 20 mmol Kphos and 2 gm Mg.     Intervention Category Major Interventions: Other:  Brighid Koch 02/26/2016, 11:29 PM

## 2016-02-26 NOTE — ED Notes (Signed)
Zosyn restarted

## 2016-02-26 NOTE — ED Notes (Signed)
Patient transported to CT 

## 2016-02-26 NOTE — ED Notes (Signed)
Bed: TA56 Expected date:  Expected time:  Means of arrival:  Comments: EMS- hyperglycemia

## 2016-02-26 NOTE — Progress Notes (Signed)
eLink Physician-Brief Progress Note Patient Name: Madison [name] DOB: 03-31-1961 MRN: [id number]   Date of Service  02/26/2016  HPI/Events of Note  Hypotension - BP = 93/39 with MAP = 57.  eICU Interventions  Will bolus with 0.9 NaCl 1 liter IV over 1 hour now.      Intervention Category Major Interventions: Hypotension - evaluation and management  Lenell Antu 02/26/2016, 4:49 PM

## 2016-02-26 NOTE — Progress Notes (Signed)
eLink Physician-Brief Progress Note Patient Name: Madison [name] DOB: 1961/04/30 MRN: [id number]   Date of Service  02/26/2016  HPI/Events of Note  Lactic Acid = 7.5 >> 7.6. Last LVEF = 45% to 50%.  eICU Interventions  Will order: 1. 0.9 NaCl 1 liter IV over 1 hour now.      Intervention Category Major Interventions: Acid-Base disturbance - evaluation and management  Sommer,Steven Eugene 02/26/2016, 9:55 PM

## 2016-02-26 NOTE — ED Notes (Signed)
Paused abx

## 2016-02-26 NOTE — ED Provider Notes (Signed)
CSN: [id number]     Arrival date & time 02/26/16  1102 History   First MD Initiated Contact with Patient 02/26/16 1106     Chief Complaint  Patient presents with  . Hyperglycemia     (Consider location/radiation/quality/duration/timing/severity/associated sxs/prior Treatment) HPI Comments: Patient with history of frequent DKA. Presents today with clinical signs and symptoms of DKA, unresponsive. Level V caveat, pt unresponsive.   Patient is a 55 y.o. female presenting with hyperglycemia. The history is provided by medical records.  Hyperglycemia   Past Medical History  Diagnosis Date  . CAD (coronary artery disease)   . Obesity   . Hypercholesteremia   . HTN (hypertension)   . Dyslipidemia   . Aortic stenosis   . Thrombophlebitis   . Heart murmur   . Myocardial infarction (HCC) 12/2014  . Pneumonia 11/2014; 12/2014  . GERD (gastroesophageal reflux disease)   . Stroke syndrome [facility]) 1995; 2001    "when my son was born; problems w/speech and L hand since then" (01/19/2015)  . Anxiety   . Depression   . Allergy   . CHF (congestive heart failure) (HCC)   . Rheumatoid arthritis(714.0)     "hands" (07/01/2015)  . Diabetes (HCC) dx'd 1982    "went straight to insulin"   Past Surgical History  Procedure Laterality Date  . Cesarean section  1995  . Foot fracture surgery    . Knee arthroscopy Right   . Neuroplasty / transposition median nerve at carpal tunnel    . Left heart catheterization with coronary angiogram N/A 12/08/2014    Procedure: LEFT HEART CATHETERIZATION WITH CORONARY ANGIOGRAM;  Surgeon: Iran Ouch, MD;  Location: [facility] CATH LAB;  Service: Cardiovascular;  Laterality: N/A;  . Percutaneous coronary stent intervention (pci-s) N/A 12/09/2014    Procedure: PERCUTANEOUS CORONARY STENT INTERVENTION (PCI-S);  Surgeon: [name], MD;  Location: [facility] CATH LAB;  Service: Cardiovascular;  Laterality: N/A;  . Cardiac catheterization  12/08/2014  . Coronary angioplasty with  stent placement  12/09/2014  . Aortic valve replacement (avr)/coronary artery bypass grafting (cabg)  "2014"    [name] 03/08/2014  . Fracture surgery    . Tubal ligation  1995  . Cataract extraction Left   . Coronary artery bypass graft    . Orif ankle fracture Left 07/04/2015    Procedure: OPEN REDUCTION INTERNAL FIXATION (ORIF)  TRIMAL ANKLE FRACTURE;  Surgeon: [name], MD;  Location: [facility] OR;  Service: Orthopedics;  Laterality: Left;   Family History  Problem Relation Age of Onset  . Stroke    . Heart disease Mother   . Heart disease Sister    Social History  Substance Use Topics  . Smoking status: Never Smoker   . Smokeless tobacco: Never Used  . Alcohol Use: No   OB History    No data available     Review of Systems  Unable to perform ROS: Patient unresponsive    Allergies  Adhesive; Celebrex; and Detrol  Home Medications   Prior to Admission medications   Medication Sig Start Date End Date Taking? Authorizing Provider  acetaminophen (TYLENOL) 325 MG tablet Take 2 tablets (650 mg total) by mouth every 6 (six) hours as needed for mild pain (temp > 101.5). 08/09/15   Leana Roe Elgergawy, MD  enoxaparin (LOVENOX) 80 MG/0.8ML injection Inject 0.7 mLs (70 mg total) into the skin every 12 (twelve) hours. For 3-4days, STop when INR >2.5 02/14/16   Zannie Cove, MD  escitalopram (LEXAPRO) 20 MG  tablet Take 1 tablet (20 mg total) by mouth daily. 11/03/15   [name], NP  insulin aspart (NOVOLOG) 100 UNIT/ML injection Inject 5 Units into the skin 3 (three) times daily with meals. 02/14/16   Zannie Cove, MD  insulin detemir (LEVEMIR) 100 UNIT/ML injection Inject 0.2 mLs (20 Units total) into the skin daily. 02/14/16   Zannie Cove, MD  lisinopril (PRINIVIL,ZESTRIL) 2.5 MG tablet Take 2.5 mg by mouth daily.  10/22/15   Historical Provider, MD  omeprazole (PRILOSEC) 40 MG capsule TAKE 1 CAPSULE (40 MG TOTAL) BY MOUTH DAILY. 09/12/15   [name], NP  ondansetron (ZOFRAN) 4  MG tablet Take 1 tablet (4 mg total) by mouth every 8 (eight) hours as needed for nausea or vomiting. 02/06/16   [name], NP  pravastatin (PRAVACHOL) 20 MG tablet Take 1 tablet (20 mg total) by mouth daily. 08/17/15   [name], NP  solifenacin (VESICARE) 5 MG tablet Take 1 tablet (5 mg total) by mouth daily. 05/03/15   [name], NP  traZODone (DESYREL) 50 MG tablet Take 50 mg by mouth at bedtime.  12/17/15   Historical Provider, MD  warfarin (COUMADIN) 7.5 MG tablet Take 1 tablet daily or as directed by coumadin clinic. Patient taking differently: Take 7.5 mg by mouth daily at 6 PM. Take 1 tablet daily or as directed by coumadin clinic. 01/26/16   [name], NP   BP 112/98 mmHg  Pulse 117  Temp(Src) 96.6 F (35.9 C) (Rectal)  Resp 28  SpO2 97%   Physical Exam  Constitutional: She appears well-developed. She appears ill.  HENT:  Head: Normocephalic and atraumatic.  Mucous membranes dry.  Eyes: Conjunctivae are normal. Right eye exhibits no discharge. Left eye exhibits no discharge.  Sluggish pupils, dry eyes.  Neck: Normal range of motion. Neck supple.  Cardiovascular: Regular rhythm and normal heart sounds.  Tachycardia present.   Pulmonary/Chest: Breath sounds normal. Tachypnea noted. She is in respiratory distress. She has no decreased breath sounds. She has no wheezes. She has no rales.  Kussmaul respirations rate 30-40.  Abdominal: Soft. There is no tenderness.  Neurological: She is unresponsive. GCS eye subscore is 2. GCS verbal subscore is 2. GCS motor subscore is 4.  Withdraws from pain. Moves all extremities.   Skin: Skin is warm and dry.  Psychiatric: She has a normal mood and affect.  Nursing note and vitals reviewed.   ED Course  Procedures (including critical care time) Labs Review Labs Reviewed  CBC WITH DIFFERENTIAL/PLATELET - Abnormal; Notable for the following:    WBC 15.2 (*)    Hemoglobin 11.1 (*)    MCH 25.6 (*)    MCHC 26.6 (*)    RDW 20.2  (*)    Platelets 500 (*)    Neutro Abs 10.3 (*)    Lymphs Abs 4.3 (*)    All other components within normal limits  BLOOD GAS, VENOUS - Abnormal; Notable for the following:    pH, Ven 6.901 (*)    pCO2, Ven 12.9 (*)    pO2, Ven 87.1 (*)    Bicarbonate 2.4 (*)    All other components within normal limits  COMPREHENSIVE METABOLIC PANEL - Abnormal; Notable for the following:    Sodium 128 (*)    Potassium 6.7 (*)    Chloride 90 (*)    CO2 <7 (*)    BUN 25 (*)    Creatinine, Ser 1.87 (*)    Total Bilirubin 1.4 (*)  GFR calc non Af Amer 29 (*)    GFR calc Af Amer 34 (*)    All other components within normal limits  PROTIME-INR - Abnormal; Notable for the following:    Prothrombin Time 33.0 (*)    INR 3.46 (*)    All other components within normal limits  CBG MONITORING, ED - Abnormal; Notable for the following:    Glucose-Capillary >600 (*)    All other components within normal limits  I-STAT CG4 LACTIC ACID, ED - Abnormal; Notable for the following:    Lactic Acid, Venous 10.38 (*)    All other components within normal limits  CULTURE, BLOOD (ROUTINE X 2)  CULTURE, BLOOD (ROUTINE X 2)  URINE CULTURE  URINALYSIS, ROUTINE W REFLEX MICROSCOPIC (NOT AT [facility])  I-STAT TROPOININ, ED      EKG Interpretation   Date/Time:  Sunday February 26 2016 11:31:40 EDT Ventricular Rate:  122 PR Interval:  174 QRS Duration: 110 QT Interval:  404 QTC Calculation: 576 R Axis:   34 Text Interpretation:  Sinus tachycardia Probable left atrial enlargement  Low voltage, extremity leads ST elevation suggests acute pericarditis  Prolonged QT interval Baseline wander peaked T waves similar to previous  Confirmed by LITTLE MD, RACHEL ([id number]) on 02/26/2016 11:57:36 AM       11 :52 AM Patient seen and examined. Patient is critically ill with presumed DKA. Pt seen with Dr. . 22g peripheral IV established L arm. R EJ established by Dr. Clarene Duke. Patient is receiving fluids. Calcium ordered for  peaked t-waves on EKG. Will cover for sepsis, pt is hypothermic. Pt not stable for imaging at this time.   Vital signs reviewed and are as follows: BP 112/98 mmHg  Pulse 117  Temp(Src) 96.6 F (35.9 C) (Rectal)  Resp 28  SpO2 97%  12:06 PM Spoke with PCCM who will see. Reccs 2-3L bolus, 1 amp bicarb then bicarb drip.   12:43 PM Patient has been stable during multiple rechecks. She has received 2+L of NS to this point.   Dr. Clarene Duke at bedside. Will admit to ICU.   BP 105/51 mmHg  Pulse 116  Temp(Src) 96.6 F (35.9 C) (Rectal)  Resp 35  SpO2 100%     CRITICAL CARE Performed by: Jamison Neighbor Total critical care time: 45 minutes Critical care time was exclusive of separately billable procedures and treating other patients. Critical care was necessary to treat or prevent imminent or life-threatening deterioration. Critical care was time spent personally by me on the following activities: development of treatment plan with patient and/or surrogate as well as nursing, discussions with consultants, evaluation of patient's response to treatment, examination of patient, obtaining history from patient or surrogate, ordering and performing treatments and interventions, ordering and review of laboratory studies, ordering and review of radiographic studies, pulse oximetry and re-evaluation of patient's condition.    MDM   Final diagnoses:  Diabetic ketoacidosis with coma associated with type 1 diabetes mellitus (HCC)   Admit.    Carolee Rota, PA-C 02/26/16 1645  04/27/16, MD 03/01/16 [phone]

## 2016-02-26 NOTE — ED Notes (Signed)
Potassium - 6.7 critical result called from lab

## 2016-02-26 NOTE — ED Notes (Signed)
Glucose of 1204 reported from lab. RN Dot Lanes and PA Geiple made aware.

## 2016-02-26 NOTE — ED Notes (Signed)
Bed: WA20 Expected date:  Expected time:  Means of arrival:  Comments: 

## 2016-02-27 DIAGNOSIS — E1010 Type 1 diabetes mellitus with ketoacidosis without coma: Secondary | ICD-10-CM

## 2016-02-27 DIAGNOSIS — G9340 Encephalopathy, unspecified: Secondary | ICD-10-CM

## 2016-02-27 LAB — BASIC METABOLIC PANEL
Anion gap: 8 (ref 5–15)
Anion gap: 10 (ref 5–15)
Anion gap: 10 (ref 5–15)
BUN: 13 mg/dL (ref 6–20)
BUN: 12 mg/dL (ref 6–20)
BUN: 7 mg/dL (ref 6–20)
CHLORIDE: 112 mmol/L — AB (ref 101–111)
CO2: 24 mmol/L (ref 22–32)
CO2: 21 mmol/L — AB (ref 22–32)
CO2: 23 mmol/L (ref 22–32)
CREATININE: 0.87 mg/dL (ref 0.44–1.00)
Calcium: 8 mg/dL — ABNORMAL LOW (ref 8.9–10.3)
Calcium: 7.3 mg/dL — ABNORMAL LOW (ref 8.9–10.3)
Calcium: 7.4 mg/dL — ABNORMAL LOW (ref 8.9–10.3)
Chloride: 108 mmol/L (ref 101–111)
Chloride: 103 mmol/L (ref 101–111)
Creatinine, Ser: 1 mg/dL (ref 0.44–1.00)
Creatinine, Ser: 0.96 mg/dL (ref 0.44–1.00)
GFR calc Af Amer: >60 mL/min (ref >60)
GFR calc Af Amer: >60 mL/min (ref >60)
GFR calc non Af Amer: >60 mL/min (ref >60)
GFR calc non Af Amer: >60 mL/min (ref >60)
GFR calc non Af Amer: >60 mL/min (ref >60)
GLUCOSE: 156 mg/dL — AB (ref 65–99)
Glucose, Bld: 98 mg/dL (ref 65–99)
Glucose, Bld: 129 mg/dL — ABNORMAL HIGH (ref 65–99)
POTASSIUM: 2.9 mmol/L — AB (ref 3.5–5.1)
Potassium: 3.8 mmol/L (ref 3.5–5.1)
Potassium: 3 mmol/L — ABNORMAL LOW (ref 3.5–5.1)
SODIUM: 144 mmol/L (ref 135–145)
Sodium: 139 mmol/L (ref 135–145)
Sodium: 136 mmol/L (ref 135–145)

## 2016-02-27 LAB — GLUCOSE, CAPILLARY
GLUCOSE-CAPILLARY: 119 mg/dL — AB (ref 65–99)
GLUCOSE-CAPILLARY: 100 mg/dL — AB (ref 65–99)
GLUCOSE-CAPILLARY: 116 mg/dL — AB (ref 65–99)
GLUCOSE-CAPILLARY: 130 mg/dL — AB (ref 65–99)
GLUCOSE-CAPILLARY: 144 mg/dL — AB (ref 65–99)
GLUCOSE-CAPILLARY: 145 mg/dL — AB (ref 65–99)
GLUCOSE-CAPILLARY: 250 mg/dL — AB (ref 65–99)
GLUCOSE-CAPILLARY: 153 mg/dL — AB (ref 65–99)
GLUCOSE-CAPILLARY: 86 mg/dL (ref 65–99)
Glucose-Capillary: 123 mg/dL — ABNORMAL HIGH (ref 65–99)
Glucose-Capillary: 151 mg/dL — ABNORMAL HIGH (ref 65–99)
Glucose-Capillary: 155 mg/dL — ABNORMAL HIGH (ref 65–99)
Glucose-Capillary: 161 mg/dL — ABNORMAL HIGH (ref 65–99)
Glucose-Capillary: 171 mg/dL — ABNORMAL HIGH (ref 65–99)

## 2016-02-27 LAB — CBC
HEMATOCRIT: 29.7 % — AB (ref 36.0–46.0)
HEMOGLOBIN: 9.7 g/dL — AB (ref 12.0–15.0)
MCH: 26.4 pg (ref 26.0–34.0)
MCHC: 32.7 g/dL (ref 30.0–36.0)
MCV: 80.9 fL (ref 78.0–100.0)
Platelets: 302 10*3/uL (ref 150–400)
RBC: 3.67 MIL/uL — ABNORMAL LOW (ref 3.87–5.11)
RDW: 18.9 % — ABNORMAL HIGH (ref 11.5–15.5)
WBC: 16.9 10*3/uL — ABNORMAL HIGH (ref 4.0–10.5)

## 2016-02-27 LAB — BLOOD GAS, VENOUS
Bicarbonate: 2.4 mEq/L — ABNORMAL LOW (ref 20.0–24.0)
O2 SAT: 85.7 %
PATIENT TEMPERATURE: 98.6
PH VEN: 6.901 — AB (ref 7.250–7.300)
PO2 VEN: 87.1 mmHg — AB (ref 31.0–45.0)
TCO2: 2.5 mmol/L (ref 0–100)
pCO2, Ven: 12.9 mmHg — ABNORMAL LOW (ref 45.0–50.0)

## 2016-02-27 LAB — T4, FREE: FREE T4: 0.9 ng/dL (ref 0.61–1.12)

## 2016-02-27 LAB — LACTIC ACID, PLASMA: LACTIC ACID, VENOUS: 3.1 mmol/L — AB (ref 0.5–2.0)

## 2016-02-27 LAB — PROTIME-INR
INR: 4.38 — ABNORMAL HIGH (ref 0.00–1.49)
Prothrombin Time: 39.5 seconds — ABNORMAL HIGH (ref 11.6–15.2)

## 2016-02-27 LAB — TROPONIN I
TROPONIN I: 0.11 ng/mL — AB (ref <0.031)
TROPONIN I: 0.09 ng/mL — AB (ref <0.031)

## 2016-02-27 LAB — MAGNESIUM: Magnesium: 1.9 mg/dL (ref 1.7–2.4)

## 2016-02-27 MED ORDER — WARFARIN - PHARMACIST DOSING INPATIENT: Freq: Every day | Status: DC

## 2016-02-27 MED ORDER — VANCOMYCIN HCL IN DEXTROSE 750-5 MG/150ML-% IV SOLN
750.0000 mg | Freq: Two times a day (BID) | INTRAVENOUS | Status: DC
  Administered 2016-02-27 – 2016-02-28 (×3): 750 mg via INTRAVENOUS
  Filled 2016-02-27 (×3): qty 150

## 2016-02-27 MED ORDER — INSULIN ASPART 100 UNIT/ML ~~LOC~~ SOLN
0.0000 [IU] | SUBCUTANEOUS | Status: DC
  Administered 2016-02-27: 3 [IU] via SUBCUTANEOUS
  Administered 2016-02-27: 4 [IU] via SUBCUTANEOUS
  Administered 2016-02-27: 7 [IU] via SUBCUTANEOUS
  Administered 2016-02-28: 3 [IU] via SUBCUTANEOUS
  Administered 2016-02-28: 15 [IU] via SUBCUTANEOUS

## 2016-02-27 MED ORDER — INSULIN DETEMIR 100 UNIT/ML ~~LOC~~ SOLN
20.0000 [IU] | Freq: Every day | SUBCUTANEOUS | Status: DC
  Administered 2016-02-27 – 2016-03-01 (×4): 20 [IU] via SUBCUTANEOUS
  Filled 2016-02-27 (×4): qty 0.2

## 2016-02-27 MED ORDER — POTASSIUM & SODIUM PHOSPHATES 280-160-250 MG PO PACK
2.0000 | PACK | Freq: Three times a day (TID) | ORAL | Status: AC
  Administered 2016-02-27 (×3): 2 via ORAL
  Filled 2016-02-27 (×3): qty 2

## 2016-02-27 NOTE — Progress Notes (Signed)
PULMONARY / CRITICAL CARE MEDICINE   Name: Madison [name] MRN: [id number] DOB: [birth year]    ADMISSION DATE:  02/26/2016 CONSULTATION DATE:  02/26/2016  REFERRING MD:  Renne Crigler, PA / EDP  CHIEF COMPLAINT:  DKA  HISTORY OF PRESENT ILLNESS:   Patient presented to emergency department 4/9 unresponsive. She was found to be again in DKA.  previous admission from 3/22 to 3/28 for the same. Patient has had multiple previous admissions for diabetic ketoacidosis. history of mechanical aortic valve on systemic anticoagulation as well. All cultures from previous admission were negative.    SUBJECTIVE: Awake, interactive Denies pain Low gr fever  VITAL SIGNS: BP 112/41 mmHg  Pulse 93  Temp(Src) 98.9 F (37.2 C) (Axillary)  Resp 22  Ht 5\' 3"  (1.6 m)  Wt 143 lb 15.4 oz (65.3 kg)  BMI 25.51 kg/m2  SpO2 97%  HEMODYNAMICS:    VENTILATOR SETTINGS:    INTAKE / OUTPUT: I/O last 3 completed shifts: In: 7027.8 [I.V.:4171.1; IV Piggyback:2856.7] Out: 1695 [Urine:1695]  PHYSICAL EXAMINATION: General:  Acutely ill No acute distress. Integument:  Warm & dry. No rash on exposed skin. No bruising. Lymphatics:  No appreciated cervical or supraclavicular lymphadenoapthy. HEENT:  Dry mucus membranes. No scleral injection or icterus.  Cardiovascular:  regular rhythm. No edema. No appreciable JVD.  Pulmonary:  Good aeration & clear to auscultation bilaterally. Symmetric chest wall expansion. Tachypneic.  Abdomen: Soft. Normal bowel sounds. Nondistended.  Musculoskeletal:  Normal bulk and tone. No joint deformity or effusion appreciated. Neurological:  No meningismus. Symmetric deep tendon reflexes. Interactive, non focal   LABS:  BMET  Recent Labs Lab 02/26/16 2229 02/27/16 0144 02/27/16 0600  NA 147* 144 139  K 3.3* 2.9* 3.8  CL 114* 112* 108  CO2 19* 24 21*  BUN 16 13 12   CREATININE 1.13* 1.00 0.87  GLUCOSE 176* 98 156*    Electrolytes  Recent Labs Lab 02/26/16 1540  02/26/16 2041 02/26/16 2229 02/27/16 0144 02/27/16 0600  CALCIUM 8.5*  --  8.4* 8.0* 7.3*  MG 2.2 1.5* 1.6*  --   --   PHOS  --   --  <1.0*  --   --     CBC  Recent Labs Lab 02/26/16 1122  WBC 15.2*  HGB 11.1*  HCT 41.7  PLT 500*    Coag's  Recent Labs Lab 02/26/16 1122  INR 3.46*    Sepsis Markers  Recent Labs Lab 02/26/16 1535 02/26/16 2041 02/27/16 0144  LATICACIDVEN 7.5* 7.6* 3.1*    ABG No results for input(s): PHART, PCO2ART, PO2ART in the last 168 hours.  Liver Enzymes  Recent Labs Lab 02/26/16 1122  AST 26  ALT 20  ALKPHOS 121  BILITOT 1.4*  ALBUMIN 4.0    Cardiac Enzymes  Recent Labs Lab 02/26/16 2041 02/27/16 0144  TROPONINI 0.07* 0.11*    Glucose  Recent Labs Lab 02/27/16 0400 02/27/16 0456 02/27/16 0553 02/27/16 0643 02/27/16 0730 02/27/16 0840  GLUCAP 130* 144* 155* 151* 171* 161*    Imaging Ct Head Wo Contrast  02/26/2016  CLINICAL DATA:  Acute altered mental status with DKA. Acute encephalopathy. EXAM: CT HEAD WITHOUT CONTRAST TECHNIQUE: Contiguous axial images were obtained from the base of the skull through the vertex without contrast. COMPARISON:  02/09/2016 FINDINGS: Extensive encephalomalacia in the posterior right temporal lobe and right parietal lobe. There is also encephalomalacia in the right frontal lobe. 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 Encephalomalacia in the left occipital lobe and the left  anterior parietal lobe. Stable cerebral atrophy. No evidence for acute hemorrhage, mass lesion, midline shift or hydrocephalus. Difficult to evaluate for an acute infarct based on the old ischemic changes. There is also diffuse low density throughout the white matter. Visualized sinuses are clear. No calvarial fracture. IMPRESSION: No acute intracranial abnormality. Extensive chronic ischemic changes with widespread encephalomalacia. Electronically Signed   By: Richarda Overlie M.D.   On: 02/26/2016 13:41   Ct Chest  Wo Contrast  02/26/2016  CLINICAL DATA:  Short of breath. Rapid breathing. Altered mental status. EXAM: CT CHEST WITHOUT CONTRAST TECHNIQUE: Multidetector CT imaging of the chest was performed following the standard protocol without IV contrast. COMPARISON:  02/11/2016 FINDINGS: Neck base and axilla: Small low-density lesions in the thyroid consistent with sub cm cysts. No neck base or axillary masses or adenopathy. Mediastinum/Lymph Nodes: Heart normal in size. There are dense coronary artery calcifications and changes from previous cardiac surgery and aortic valve replacement. Great vessels are not normal in caliber. No mediastinal or hilar masses or pathologically enlarged lymph nodes. Lungs/Pleura: No pulmonary mass, infiltrate, or effusion. Upper abdomen: No acute findings. Musculoskeletal: Mild degenerative changes of the visualized spine. No fractures. No osteoblastic or osteolytic lesions. IMPRESSION: 1. No acute findings.  Lungs are clear. Electronically Signed   By: Amie Portland M.D.   On: 02/26/2016 13:35     STUDIES:  None.  MICROBIOLOGY: Blood Ctx x2 3/22:  Negative Urine Ctx 3/22:  Negative MRSA PCR 3/22:  Negative   ANTIBIOTICS: Vancomycin 4/9>>> Zosyn 4/9>>>  SIGNIFICANT EVENTS: 3/22 - 3/28 - Admit for DKA 4/09 - Admit for DKA  LINES/TUBES: PIV x2  ASSESSMENT / PLAN:  PULMONARY A: No acute issues.  P:   Monitor continuous pulse ox.  CARDIOVASCULAR A:  Prolonged QTc - resolved 4/10 420 Mechanical Aortic Valve H/O HTN H/O MI/CAD H/O Systolic CHF (EF 65-68% 07/2015)  P:  Monitor on telemetry Holding Pravachol & Lisinopril  RENAL A:   Acute Renal Failure - Likely due to dehdyration. Hyperkalemia - Likely secondary to acidosis. S/P Calcium Gluconate in ED. Pseudohyponatremia Metabolic/Ketoacidosis Lactic Acidosis -resolved Severe hypophos Hypomag  P:   Trending electrolytes q12h Dc  Bicarb gtt    GASTROINTESTINAL A:   Hyperbilirubinemia H/O  GERD H/O N/V  P:   Advance PO Protonix IV daily LFTs in AM  HEMATOLOGIC A:   Leukocytosis - Stress response versus sepsis. Anemia - Mild. No signs of active bleeding. Coagulopathy - Chronic anticoagulation for Mechanical Aortic Valve.  P:  Trending Cell Counts daily w/ CBC Trending INR daily -resume coumadin per pharmacy  INFECTIOUS A:   SIRS - Possible Sepsis.  P:   Empiric Vancomcyin & Zosyn Day #1 Blood & Urine Cultures pending Procalcitonin per algorithm  ENDOCRINE A:   DKA H/O DM Type 1 - Multiple admissions.  TSH -low normal  & Free T4 ok P:   Dc Insulin gtt - start SQ levemir 20 u & overlap 2 h Holding Rutledge insulin Dc D5 - 1/2NS  Diabetic diet  NEUROLOGIC A:   Acute Encephalopathy H/O Anxiety/Depression Head CT nml, UDS neg  P:   Holding Sedating Medications Holding Lexapro & Trazodone   FAMILY  - Updates: [name] via phone 4/9 by Dr. Jamison Neighbor.   - Inter-disciplinary family meet or Palliative Care meeting due by:  4/16  TODAY'S SUMMARY:   Etiology of DKA  is unclear but could be secondary to over consumption of carbohydrates, doubt active infection & can dc abx soon  if cx neg, pct low  To triad 4/11 am - change to SDU & transfer to floor if next BMET ok off insulin  The patient is critically ill with multiple organ systems failure and requires high complexity decision making for assessment and support, frequent evaluation and titration of therapies, application of advanced monitoring technologies and extensive interpretation of multiple databases. Critical Care Time devoted to patient care services described in this note independent of APP time is 32 minutes.    Cyril Mourning MD. Tonny Bollman. [facility] Pager [phone] If no response call [phone]   02/27/2016     02/27/2016, 8:45 AM

## 2016-02-27 NOTE — Progress Notes (Addendum)
ANTICOAGULATION CONSULT NOTE - Initial Consult  Pharmacy Consult for Warfarin Indication: mechanical aortic valve replacement  Allergies  Allergen Reactions  . Adhesive [Tape] Other (See Comments)    Can burn skin if left on too long  . Celebrex [Celecoxib] Rash  . Detrol [Tolterodine] Hives    Patient Measurements: Height: 5\' 3"  (160 cm) Weight: 143 lb 15.4 oz (65.3 kg) IBW/kg (Calculated) : 52.4  Vital Signs: Temp: 98.9 F (37.2 C) (04/10 0832) Temp Source: Axillary (04/10 0500) BP: 150/66 mmHg (04/10 0900) Pulse Rate: 91 (04/10 0900)  Labs:  Recent Labs  02/26/16 1122  02/26/16 2041 02/26/16 2229 02/27/16 0144 02/27/16 0600 02/27/16 0833  HGB 11.1*  --   --   --   --   --  9.7*  HCT 41.7  --   --   --   --   --  29.7*  PLT 500*  --   --   --   --   --  302  LABPROT 33.0*  --   --   --   --   --  39.5*  INR 3.46*  --   --   --   --   --  4.38*  CREATININE 1.87*  < >  --  1.13* 1.00 0.87  --   TROPONINI  --   --  0.07*  --  0.11*  --  0.09*  < > = values in this interval not displayed.  Estimated Creatinine Clearance: 66.4 mL/min (by C-G formula based on Cr of 0.87).   Medical History: Past Medical History  Diagnosis Date  . CAD (coronary artery disease)   . Obesity   . Hypercholesteremia   . HTN (hypertension)   . Dyslipidemia   . Aortic stenosis   . Thrombophlebitis   . Heart murmur   . Myocardial infarction (HCC) 12/2014  . Pneumonia 11/2014; 12/2014  . GERD (gastroesophageal reflux disease)   . Stroke syndrome [facility]) 1995; 2001    "when my son was born; problems w/speech and L hand since then" (01/19/2015)  . Anxiety   . Depression   . Allergy   . CHF (congestive heart failure) (HCC)   . Rheumatoid arthritis(714.0)     "hands" (07/01/2015)  . Diabetes (HCC) dx'd 1982    "went straight to insulin"    Medications:  Prescriptions prior to admission  Medication Sig Dispense Refill Last Dose  . enoxaparin (LOVENOX) 80 MG/0.8ML injection Inject 0.7  mLs (70 mg total) into the skin every 12 (twelve) hours. For 3-4days, STop when INR >2.5 7 Syringe 0 02/26/2016 at noon  . escitalopram (LEXAPRO) 20 MG tablet Take 1 tablet (20 mg total) by mouth daily. 30 tablet 6 02/26/2016 at Unknown time  . insulin aspart (NOVOLOG) 100 UNIT/ML injection Inject 5 Units into the skin 3 (three) times daily with meals. 10 mL 2 02/26/2016 at Unknown time  . insulin detemir (LEVEMIR) 100 UNIT/ML injection Inject 0.2 mLs (20 Units total) into the skin daily. 10 mL 0 02/26/2016 at Unknown time  . lisinopril (PRINIVIL,ZESTRIL) 2.5 MG tablet Take 2.5 mg by mouth daily.    02/26/2016 at Unknown time  . omeprazole (PRILOSEC) 40 MG capsule TAKE 1 CAPSULE (40 MG TOTAL) BY MOUTH DAILY. 90 capsule 1 02/26/2016 at Unknown time  . ondansetron (ZOFRAN) 4 MG tablet Take 1 tablet (4 mg total) by mouth every 8 (eight) hours as needed for nausea or vomiting. 20 tablet 1 unknown  . pravastatin (PRAVACHOL) 20 MG tablet  Take 1 tablet (20 mg total) by mouth daily. 90 tablet 0 02/26/2016 at Unknown time  . traZODone (DESYREL) 50 MG tablet Take 50 mg by mouth at bedtime.   0 02/25/2016  . warfarin (COUMADIN) 7.5 MG tablet Take 1 tablet daily or as directed by coumadin clinic. (Patient taking differently: Take 7.5 mg by mouth daily at 6 PM. Take 1 tablet daily or as directed by coumadin clinic.) 140 tablet 0 02/25/2016 at night  . acetaminophen (TYLENOL) 325 MG tablet Take 2 tablets (650 mg total) by mouth every 6 (six) hours as needed for mild pain (temp > 101.5).   Past Week at Unknown time  . solifenacin (VESICARE) 5 MG tablet Take 1 tablet (5 mg total) by mouth daily. 90 tablet 2 Past Week at Unknown time   Scheduled:  . antiseptic oral rinse  7 mL Mouth Rinse q12n4p  . chlorhexidine  15 mL Mouth Rinse BID  . insulin aspart  0-20 Units Subcutaneous 6 times per day  . insulin detemir  20 Units Subcutaneous Daily  . insulin regular  0-10 Units Intravenous TID WC  . pantoprazole (PROTONIX) IV  40 mg  Intravenous Q24H  . piperacillin-tazobactam (ZOSYN)  IV  3.375 g Intravenous Q8H  . potassium & sodium phosphates  2 packet Oral TID WC & HS  . vancomycin  750 mg Intravenous Q12H   Infusions:  . sodium chloride 75 mL/hr at 02/27/16 0930  . insulin (NOVOLIN-R) infusion 1.3 Units/hr (02/27/16 0930)   Assessment: 55 yoF admitted on 4/9 with DKA, r/o sepsis.  PMH includes mechanical aortic valve replacement on chronic warfarin anticoagulation.  Home warfarin dose is reported as 7.5 mg daily, and she was bridging with Lovenox 70mg  BID.  INR on admission was 3.46, upper end of therapeutic range.  Pharmacy has been consulted to continue warfarin dosing inpatient.  Today, 02/27/2016:  INR 4.38, elevated and supratherapeutic despite no warfarin dose yesterday  CBC: Hgb decreased (likely dilutional), Plt WNL.  No bleeding or complications reported.  Diet: NPO, just advanced to carb modified today  Drug-drug interactions: broad spectrum antibiotics may increase INR.  Goal of Therapy:  INR 2.5-3.5 (higher INR goal per anticoag clinic notes) Monitor platelets by anticoagulation protocol: Yes   Plan:   Hold warfarin today.  Daily INR and CBC.  Pharmacy to f/u daily.  04/28/2016 PharmD, BCPS Pager [phone] 02/27/2016 10:57 AM

## 2016-02-27 NOTE — Progress Notes (Signed)
CRITICAL VALUE ALERT  Critical value received:  Lactic Acid: 7.6  Date of notification:  02/26/2016  Time of notification:  2150  Critical value read back:Yes.    Nurse who received alert:  Sharl Ma, RN  MD notified (1st page):  Sommer  Time of first page:  2153  Time MD responded:  2155

## 2016-02-27 NOTE — Progress Notes (Signed)
eLink Physician-Brief Progress Note Patient Name: Madison [name] DOB: 02-28-1961 MRN: [id number]   Date of Service  02/27/2016  HPI/Events of Note  K+ = 3.0 and Creatinine = 0.96. K+ replacement already scheduled for 10 PM.  eICU Interventions  Continue current management.      Intervention Category Intermediate Interventions: Electrolyte abnormality - evaluation and management  Sommer,Steven Eugene 02/27/2016, 7:00 PM

## 2016-02-27 NOTE — Progress Notes (Signed)
CRITICAL VALUE ALERT  Critical value received:  Phosphorus: <1  Date of notification: 02/26/2016  Time of notification:  2320  Critical value read back:Yes.    Nurse who received alert:  Sharl Ma, RN  MD notified (1st page): Craige Cotta  Time of first page:  2325  Time MD responded:  2329

## 2016-02-27 NOTE — Progress Notes (Signed)
eLink Physician-Brief Progress Note Patient Name: Madison [name] DOB: 07/01/1961 MRN: [id number]   Date of Service  02/27/2016  HPI/Events of Note  Spoke with Dr. Vassie Loll, who requests that the patient be transferred to stepdown status.   eICU Interventions  Will transfer to stepdown status.      Intervention Category Minor Interventions: Routine modifications to care plan (e.g. PRN medications for pain, fever);Communication with other healthcare providers and/or family  Lenell Antu 02/27/2016, 5:32 PM

## 2016-02-27 NOTE — Progress Notes (Addendum)
Inpatient Diabetes Program Recommendations  AACE/ADA: New Consensus Statement on Inpatient Glycemic Control (2015)  Target Ranges:  Prepandial:   less than 140 mg/dL      Peak postprandial:   less than 180 mg/dL (1-2 hours)      Critically ill patients:  140 - 180 mg/dL   Results for Madison [name], Madison [name] (MRN [id number]) as of 02/27/2016 08:39  Ref. Range 02/26/2016 11:22  Sodium Latest Ref Range: 135-145 mmol/L 128 (L)  Potassium Latest Ref Range: 3.5-5.1 mmol/L 6.7 (HH)  Chloride Latest Ref Range: 101-111 mmol/L 90 (L)  CO2 Latest Ref Range: 22-32 mmol/L <7 (L)  BUN Latest Ref Range: 6-20 mg/dL 25 (H)  Creatinine Latest Ref Range: 0.44-1.00 mg/dL 1.87 (H)  Calcium Latest Ref Range: 8.9-10.3 mg/dL 9.4  EGFR (Non-African Amer.) Latest Ref Range: >60 mL/min 29 (L)  EGFR (African American) Latest Ref Range: >60 mL/min 34 (L)  Glucose Latest Ref Range: 65-99 mg/dL 1204 (HH)  Anion gap Latest Ref Range: 5-15  NOT CALCULATED    Admit with: DKA  History: DM  Home DM Meds: Levemir 20 units daily         Novolog 5 units tidwc         (just started this insulin regimen on discharge from hospital on 02/14/16- Was previously taking 70/30 insulin bidwc)  Current Insulin Orders: IV Insulin drip per DKA Protocol Orders      -Note patient just discharged from hospital after admission for DKA (03/22 through 03/28).  Was previously taking 70/30 insulin bidwc but was switched to Levemir and Novolog on 02/14/15.    -Patient has been followed by [facility] services as an outpatient since that admission.  Has been seeing Dr. Renato Shin with [facility] as an outpatient as well.  Per [facility] notes from 02/21/16, patient is seeking care under new Endocrinologist.  Waiting on referral from her PCP to see new ENDO.  -Note CO2 up to 21 and Anion Gap 10 on 6am BMET today.  -Will plan to speak with patient once she is stable and appropriate to answer questions.      MD- When patient is ready to  transition off IV insulin drip, please make sure she receives basal insulin at least 1 hour before IV insulin drip stopped.    Recommend Levemir 20 units daily (home dose) + Novolog Sensitive Correction Scale/ SSI (0-9 units) TID AC + HS     --Will follow patient during hospitalization--  Wyn Quaker RN, MSN, CDE Diabetes Coordinator Inpatient Glycemic Control Team Team Pager: [phone] (8a-5p)

## 2016-02-27 NOTE — Progress Notes (Signed)
Pharmacy Antibiotic Note  Madison [name] is a 55 y.o. female admitted on 02/26/2016 with presumed DKA, unresponsive/critically ill, will cover for r/o sepsis.  Recently admitted 3/22-28 for DKA.  Pharmacy has been consulted for Vancomycin and Zosyn dosing.  CT chest w/o acute disease.  LA elevated but improved (10.38 > 3.1).  PCT elevated/decreaseing (5.27 > 3.42).  Today, renal function has improved significantly.  Plan:  Continue Zosyn 3.375g IV Q8H infused over 4hrs.  Increase to Vancomycin 750 mg IV q12h.  Measure Vanc trough at steady state.  Follow up renal fxn, culture results, and clinical course.   Height: 5\' 3"  (160 cm) Weight: 143 lb 15.4 oz (65.3 kg) IBW/kg (Calculated) : 52.4  Temp (24hrs), Avg:98.5 F (36.9 C), Min:96.6 F (35.9 C), Max:100.5 F (38.1 C)   Recent Labs Lab 02/26/16 1122 02/26/16 1222 02/26/16 1449 02/26/16 1535 02/26/16 1540 02/26/16 2041 02/26/16 2229 02/27/16 0144 02/27/16 0600  WBC 15.2*  --   --   --   --   --   --   --   --   CREATININE 1.87*  --  1.72*  --  1.70*  --  1.13* 1.00 0.87  LATICACIDVEN  --  10.38*  --  7.5*  --  7.6*  --  3.1*  --     Estimated Creatinine Clearance: 66.4 mL/min (by C-G formula based on Cr of 0.87).    Allergies  Allergen Reactions  . Adhesive [Tape] Other (See Comments)    Can burn skin if left on too long  . Celebrex [Celecoxib] Rash  . Detrol [Tolterodine] Hives    Antimicrobials this admission: 4/9 Vancomycin >>  4/9 Zosyn >>   Dose adjustments this admission: 4/10 Empiric vancomycin increase for improved renal function.  Microbiology results: 4/9 BCx: sent 4/9 UCx: ordered  4/9 MRSA PCR: negative  Thank you for allowing pharmacy to be a part of this patient's care.  6/9 PharmD, BCPS Pager [phone] 02/27/2016 7:10 AM

## 2016-02-28 ENCOUNTER — Telehealth: Payer: Self-pay | Admitting: Adult Health

## 2016-02-28 ENCOUNTER — Ambulatory Visit: Payer: Self-pay

## 2016-02-28 ENCOUNTER — Other Ambulatory Visit: Payer: Self-pay

## 2016-02-28 DIAGNOSIS — D72829 Elevated white blood cell count, unspecified: Secondary | ICD-10-CM

## 2016-02-28 DIAGNOSIS — E871 Hypo-osmolality and hyponatremia: Secondary | ICD-10-CM | POA: Diagnosis present

## 2016-02-28 DIAGNOSIS — E1065 Type 1 diabetes mellitus with hyperglycemia: Secondary | ICD-10-CM

## 2016-02-28 DIAGNOSIS — K219 Gastro-esophageal reflux disease without esophagitis: Secondary | ICD-10-CM

## 2016-02-28 DIAGNOSIS — G9340 Encephalopathy, unspecified: Secondary | ICD-10-CM | POA: Diagnosis present

## 2016-02-28 DIAGNOSIS — K5900 Constipation, unspecified: Secondary | ICD-10-CM

## 2016-02-28 DIAGNOSIS — I350 Nonrheumatic aortic (valve) stenosis: Secondary | ICD-10-CM

## 2016-02-28 LAB — CBC
HCT: 27.2 % — ABNORMAL LOW (ref 36.0–46.0)
HEMOGLOBIN: 9.1 g/dL — AB (ref 12.0–15.0)
MCH: 26.5 pg (ref 26.0–34.0)
MCHC: 33.5 g/dL (ref 30.0–36.0)
MCV: 79.1 fL (ref 78.0–100.0)
PLATELETS: 213 10*3/uL (ref 150–400)
RBC: 3.44 MIL/uL — ABNORMAL LOW (ref 3.87–5.11)
RDW: 19.9 % — AB (ref 11.5–15.5)
WBC: 10.2 10*3/uL (ref 4.0–10.5)

## 2016-02-28 LAB — MAGNESIUM: MAGNESIUM: 1.8 mg/dL (ref 1.7–2.4)

## 2016-02-28 LAB — GLUCOSE, CAPILLARY
GLUCOSE-CAPILLARY: 108 mg/dL — AB (ref 65–99)
GLUCOSE-CAPILLARY: 120 mg/dL — AB (ref 65–99)
GLUCOSE-CAPILLARY: 55 mg/dL — AB (ref 65–99)
GLUCOSE-CAPILLARY: 83 mg/dL (ref 65–99)
GLUCOSE-CAPILLARY: 93 mg/dL (ref 65–99)
Glucose-Capillary: 134 mg/dL — ABNORMAL HIGH (ref 65–99)
Glucose-Capillary: 249 mg/dL — ABNORMAL HIGH (ref 65–99)
Glucose-Capillary: 135 mg/dL — ABNORMAL HIGH (ref 65–99)
Glucose-Capillary: 305 mg/dL — ABNORMAL HIGH (ref 65–99)
Glucose-Capillary: 84 mg/dL (ref 65–99)

## 2016-02-28 LAB — BASIC METABOLIC PANEL
ANION GAP: 10 (ref 5–15)
BUN: 5 mg/dL — AB (ref 6–20)
CHLORIDE: 109 mmol/L (ref 101–111)
CO2: 22 mmol/L (ref 22–32)
CREATININE: 0.76 mg/dL (ref 0.44–1.00)
Calcium: 7.6 mg/dL — ABNORMAL LOW (ref 8.9–10.3)
GFR calc Af Amer: >60 mL/min (ref >60)
GFR calc non Af Amer: >60 mL/min (ref >60)
GLUCOSE: 139 mg/dL — AB (ref 65–99)
Potassium: 3.7 mmol/L (ref 3.5–5.1)
Sodium: 141 mmol/L (ref 135–145)

## 2016-02-28 LAB — URINE CULTURE: Culture: NO GROWTH

## 2016-02-28 LAB — PROCALCITONIN: PROCALCITONIN: 2.68 ng/mL

## 2016-02-28 LAB — PROTIME-INR
INR: 2.87 — AB (ref 0.00–1.49)
PROTHROMBIN TIME: 28.7 s — AB (ref 11.6–15.2)

## 2016-02-28 LAB — PHOSPHORUS: Phosphorus: 3.7 mg/dL (ref 2.5–4.6)

## 2016-02-28 MED ORDER — INSULIN ASPART 100 UNIT/ML ~~LOC~~ SOLN
0.0000 [IU] | Freq: Three times a day (TID) | SUBCUTANEOUS | Status: DC
  Administered 2016-02-29: 7 [IU] via SUBCUTANEOUS
  Administered 2016-02-29: 3 [IU] via SUBCUTANEOUS
  Administered 2016-02-29 – 2016-03-01 (×2): 7 [IU] via SUBCUTANEOUS
  Administered 2016-03-01: 3 [IU] via SUBCUTANEOUS

## 2016-02-28 MED ORDER — ESCITALOPRAM OXALATE 20 MG PO TABS: 20.0000 mg | ORAL_TABLET | Freq: Every day | ORAL | Status: DC

## 2016-02-28 MED ORDER — SENNOSIDES-DOCUSATE SODIUM 8.6-50 MG PO TABS
1.0000 | ORAL_TABLET | Freq: Two times a day (BID) | ORAL | Status: DC
  Administered 2016-02-28 – 2016-03-01 (×5): 1 via ORAL
  Filled 2016-02-28 (×4): qty 1

## 2016-02-28 MED ORDER — PRAVASTATIN SODIUM 20 MG PO TABS
20.0000 mg | ORAL_TABLET | Freq: Every day | ORAL | Status: DC
  Administered 2016-02-28 – 2016-03-01 (×3): 20 mg via ORAL
  Filled 2016-02-28 (×3): qty 1

## 2016-02-28 MED ORDER — ONDANSETRON HCL 4 MG PO TABS
4.0000 mg | ORAL_TABLET | Freq: Three times a day (TID) | ORAL | Status: DC | PRN
Start: 2016-02-28 — End: 2016-03-01

## 2016-02-28 MED ORDER — DEXTROSE 5 % IV SOLN
1.0000 g | Freq: Every day | INTRAVENOUS | Status: DC
  Administered 2016-02-28 – 2016-02-29 (×2): 1 g via INTRAVENOUS
  Filled 2016-02-28 (×2): qty 10

## 2016-02-28 MED ORDER — ACETAMINOPHEN 325 MG PO TABS: 650.0000 mg | ORAL_TABLET | Freq: Four times a day (QID) | ORAL | Status: DC | PRN

## 2016-02-28 MED ORDER — WARFARIN SODIUM 5 MG PO TABS
7.5000 mg | ORAL_TABLET | Freq: Once | ORAL | Status: AC
  Administered 2016-02-28: 7.5 mg via ORAL
  Filled 2016-02-28: qty 1

## 2016-02-28 MED ORDER — DARIFENACIN HYDROBROMIDE ER 7.5 MG PO TB24
7.5000 mg | ORAL_TABLET | Freq: Every day | ORAL | Status: DC
Start: 2016-02-28 — End: 2016-03-01
  Administered 2016-02-28 – 2016-03-01 (×3): 7.5 mg via ORAL
  Filled 2016-02-28 (×3): qty 1

## 2016-02-28 MED ORDER — PANTOPRAZOLE SODIUM 40 MG PO TBEC
40.0000 mg | DELAYED_RELEASE_TABLET | Freq: Every day | ORAL | Status: DC
  Administered 2016-02-28 – 2016-03-01 (×3): 40 mg via ORAL
  Filled 2016-02-28 (×3): qty 1

## 2016-02-28 MED ORDER — INSULIN ASPART 100 UNIT/ML ~~LOC~~ SOLN: 0.0000 [IU] | SUBCUTANEOUS | Status: DC

## 2016-02-28 NOTE — Telephone Encounter (Signed)
Overton Mam with child protection service will be sending over a form concerning if Pt is able to take care of your child(ren) due to her medical condition and medication she is receiving.

## 2016-02-28 NOTE — Progress Notes (Signed)
[facility] PROGRESS NOTE  Madison [name] KKD:[id number] DOB: [birth year] DOA: 02/26/2016 PCP: [name], NP  Assessment/Plan: #1  Diabetic ketoacidosis/poorly controlled type 1 diabetes Patient with recurrent hospitalizations for DKA. Etiology of DKA unknown. Patient currently off insulin drip and has been transitioned to subcutaneous insulin. Patient has been pancultured and results pending. CT chest negative for any acute infiltrate. CT head negative. Patient states she thinks may have eaten something bad out of her refrigerator course she started to have significant nausea or vomiting and subsequently elevated CBGs that brought her to the hospital this time. We will narrow antibiotics and discontinue IV vancomycin and IV Zosyn and place on IV Rocephin and transitioned to oral antibiotics tomorrow for short 5 to seven-day course of treatment as patient noted to have an elevated lactic acid level of 3.1 and a pro-calcitonin of 2.68. Repeat lactic acid level tomorrow. Continue current dose of Lantus and sliding scale insulin. Follow.  #2 hyperkalemia/hypokalemia Likely secondary to problem #1 and acute renal failure. Resolved.  #3 acute renal failure Likely secondary to prerenal azotemia secondary to problem #1 in the setting of ACE inhibitor. ACE inhibitor held. Patient hydrated with IV fluids with resolution of acute renal failure. Discontinue IV fluids. Follow.  #4 pseudohyponatremia Secondary to problem #1 resolved.  #5 Elevated troponins Likely secondary to demand ischemia. Patient asymptomatic. Troponin is trending down. Patient with 2-D echo 08/01/2015 with a EF of 45-50% with hypokinesis of the mid apical anterior septal and anterior myocardium with grade 2 diastolic dysfunction. Aortic valve mechanical prosthesis present and functioning normally. No further workup needed at this time. Follow.  #6 status post aortic valve replacement INR is 2.87. Goal INR 2.5-3.5. Coumadin per  pharmacy.  #7 leukocytosis Questionable etiology. Patient has been pancultured and results pending. Lactic acid was elevated at 3.1 and a pro-calcitonin of 2.68. Patient afebrile. Narrow IV antibiotics to IV Rocephin.  #8 history of GERD PPI.  #9 acute encephalopathy Likely secondary to prominent #1. Resolved.  #10 depression/anxiety Resume home regimen of Lexapro.  #11 history of coronary artery disease/history of systolic heart failure Stable. Resume Pravachol. Continue to hold ACE inhibitor and may resume in 1-2 days secondary to presentation with acute renal failure.  #12 prophylaxis PPI for GI prophylaxis. On Coumadin for DVT prophylaxis.    Code Status: Full Family Communication: Updated patient. No family at bedside. Disposition Plan: Transfer to telemetry.   Consultants:  PCCM admission  Procedures:  CT head 02/26/2016  CT chest 02/26/2016  Antibiotics:  IV vancomycin 02/26/2016>>>> 02/28/2016  IV Zosyn 02/26/2016>>>> 02/28/2016  IV Rocephin 02/28/2016  HPI/Subjective: Patient sitting in chair. Patient denies any chest pain. No shortness of breath. Patient states she's been compliant with her medications. Patient complaining of constipation.  Objective: Filed Vitals:   02/28/16 0730 02/28/16 0800  BP:  133/50  Pulse:    Temp: 98.5 F (36.9 C)   Resp:  13    Intake/Output Summary (Last 24 hours) at 02/28/16 0934 Last data filed at 02/28/16 0800  Gross per 24 hour  Intake   2940 ml  Output   3475 ml  Net   -535 ml   Filed Weights   02/26/16 1400 02/27/16 0500 02/28/16 0550  Weight: 61.2 kg (134 lb 14.7 oz) 65.3 kg (143 lb 15.4 oz) 67.7 kg (149 lb 4 oz)    Exam:   General:  NAD.Sitting in chair  Cardiovascular: RRR with 3/6 SEM   Respiratory: Clear to auscultation bilaterally.   Abdomen:  soft, nontender, nondistended, positive bowel sounds.  Musculoskeletal: No clubbing cyanosis or edema.   Data Reviewed: Basic Metabolic  Panel:  Recent Labs Lab 02/26/16 1540 02/26/16 2041 02/26/16 2229 02/27/16 0144 02/27/16 0600 02/27/16 0833 02/27/16 1554 02/28/16 0321  NA 140  --  147* 144 139  --  136 141  K 5.1  --  3.3* 2.9* 3.8  --  3.0* 3.7  CL 103  --  114* 112* 108  --  103 109  CO2 <7*  --  19* 24 21*  --  23 22  GLUCOSE 872*  --  176* 98 156*  --  129* 139*  BUN 22*  --  16 13 12   --  7 5*  CREATININE 1.70*  --  1.13* 1.00 0.87  --  0.96 0.76  CALCIUM 8.5*  --  8.4* 8.0* 7.3*  --  7.4* 7.6*  MG 2.2 1.5* 1.6*  --   --  1.9  --  1.8  PHOS  --   --  <1.0*  --   --   --   --  3.7   Liver Function Tests:  Recent Labs Lab 02/26/16 1122  AST 26  ALT 20  ALKPHOS 121  BILITOT 1.4*  PROT 7.5  ALBUMIN 4.0   No results for input(s): LIPASE, AMYLASE in the last 168 hours. No results for input(s): AMMONIA in the last 168 hours. CBC:  Recent Labs Lab 02/26/16 1122 02/27/16 0833 02/28/16 0321  WBC 15.2* 16.9* 10.2  NEUTROABS 10.3*  --   --   HGB 11.1* 9.7* 9.1*  HCT 41.7 29.7* 27.2*  MCV 96.1 80.9 79.1  PLT 500* 302 213   Cardiac Enzymes:  Recent Labs Lab 02/26/16 2041 02/27/16 0144 02/27/16 0833  TROPONINI 0.07* 0.11* 0.09*   BNP (last 3 results)  Recent Labs  07/30/15 1400  BNP 398.4*    ProBNP (last 3 results) No results for input(s): PROBNP in the last 8760 hours.  CBG:  Recent Labs Lab 02/27/16 1616 02/27/16 1946 02/27/16 2335 02/28/16 0201 02/28/16 0740  GLUCAP 153* 86 120* 93 108*    Recent Results (from the past 240 hour(s))  Blood Culture (routine x 2)     Status: None (Preliminary result)   Collection Time: 02/26/16  1:06 PM  Result Value Ref Range Status   Specimen Description BLOOD RIGHT FOREARM  Final   Special Requests IN PEDIATRIC BOTTLE 2CC  Final   Culture   Final    NO GROWTH < 24 HOURS Performed at [facility]    Report Status PENDING  Incomplete  MRSA PCR Screening     Status: None   Collection Time: 02/26/16  2:34 PM  Result Value  Ref Range Status   MRSA by PCR NEGATIVE NEGATIVE Final    Comment:        The GeneXpert MRSA Assay (FDA approved for NASAL specimens only), is one component of a comprehensive MRSA colonization surveillance program. It is not intended to diagnose MRSA infection nor to guide or monitor treatment for MRSA infections.   Blood Culture (routine x 2)     Status: None (Preliminary result)   Collection Time: 02/26/16  3:35 PM  Result Value Ref Range Status   Specimen Description BLOOD RIGHT ANTECUBITAL  Final   Special Requests IN PEDIATRIC BOTTLE 1CC  Final   Culture   Final    NO GROWTH < 24 HOURS Performed at [facility]    Report Status PENDING  Incomplete  Urine culture     Status: None (Preliminary result)   Collection Time: 02/26/16  4:00 PM  Result Value Ref Range Status   Specimen Description URINE, CATHETERIZED  Final   Special Requests NONE  Final   Culture   Final    NO GROWTH < 24 HOURS Performed at [facility]    Report Status PENDING  Incomplete     Studies: Ct Head Wo Contrast  02/26/2016  CLINICAL DATA:  Acute altered mental status with DKA. Acute encephalopathy. EXAM: CT HEAD WITHOUT CONTRAST TECHNIQUE: Contiguous axial images were obtained from the base of the skull through the vertex without contrast. COMPARISON:  02/09/2016 FINDINGS: Extensive encephalomalacia in the posterior right temporal lobe and right parietal lobe. There is also encephalomalacia in the right frontal lobe. 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 Encephalomalacia in the left occipital lobe and the left anterior parietal lobe. Stable cerebral atrophy. No evidence for acute hemorrhage, mass lesion, midline shift or hydrocephalus. Difficult to evaluate for an acute infarct based on the old ischemic changes. There is also diffuse low density throughout the white matter. Visualized sinuses are clear. No calvarial fracture. IMPRESSION: No acute intracranial abnormality. Extensive  chronic ischemic changes with widespread encephalomalacia. Electronically Signed   By: Richarda Overlie M.D.   On: 02/26/2016 13:41   Ct Chest Wo Contrast  02/26/2016  CLINICAL DATA:  Short of breath. Rapid breathing. Altered mental status. EXAM: CT CHEST WITHOUT CONTRAST TECHNIQUE: Multidetector CT imaging of the chest was performed following the standard protocol without IV contrast. COMPARISON:  02/11/2016 FINDINGS: Neck base and axilla: Small low-density lesions in the thyroid consistent with sub cm cysts. No neck base or axillary masses or adenopathy. Mediastinum/Lymph Nodes: Heart normal in size. There are dense coronary artery calcifications and changes from previous cardiac surgery and aortic valve replacement. Great vessels are not normal in caliber. No mediastinal or hilar masses or pathologically enlarged lymph nodes. Lungs/Pleura: No pulmonary mass, infiltrate, or effusion. Upper abdomen: No acute findings. Musculoskeletal: Mild degenerative changes of the visualized spine. No fractures. No osteoblastic or osteolytic lesions. IMPRESSION: 1. No acute findings.  Lungs are clear. Electronically Signed   By: Amie Portland M.D.   On: 02/26/2016 13:35    Scheduled Meds: . antiseptic oral rinse  7 mL Mouth Rinse q12n4p  . chlorhexidine  15 mL Mouth Rinse BID  . insulin aspart  0-20 Units Subcutaneous 6 times per day  . insulin detemir  20 Units Subcutaneous Daily  . pantoprazole (PROTONIX) IV  40 mg Intravenous Q24H  . piperacillin-tazobactam (ZOSYN)  IV  3.375 g Intravenous Q8H  . senna-docusate  1 tablet Oral BID  . warfarin  7.5 mg Oral ONCE-1800  . Warfarin - Pharmacist Dosing Inpatient   Does not apply q1800   Continuous Infusions: . sodium chloride 75 mL/hr at 02/27/16 1800    Principal Problem:   DKA (diabetic ketoacidoses) (HCC) Active Problems:   CAD (coronary artery disease)   Aortic stenosis   Hyperlipidemia   Rheumatoid arthritis (HCC)   Depression   Diabetes type 1,  uncontrolled (HCC)   S/P aortic valve replacement   Essential hypertension   Hyperkalemia   Constipation   Leukocytosis   ARF (acute renal failure) (HCC)   Chronic systolic heart failure (HCC)   Esophageal reflux   Acute encephalopathy   Hyponatremia: Pseudo    Time spent: 40 minutes    Hazleigh Mccleave M.D. [facility] Pager [phone]. If 7PM-7AM, please contact night-coverage  at [email or web address], password [facility] 02/28/2016, 9:34 AM  LOS: 2 days

## 2016-02-28 NOTE — Patient Outreach (Signed)
Unsuccessful attempt made to contact patient via telephone at [phone] twice. Unable to leave a HIPPA compliant message as phone's recording device did not pick up.  Plan: Make another attempt attempt tomorrow, April 12

## 2016-02-28 NOTE — Consult Note (Addendum)
   [facility] CM Inpatient Consult   02/28/2016  Madison [name] [birth year] [id number] Patient is currently active [up to admission] with [facility] Care Management for chronic disease management services, Diabetes.    Patient has been engaged by a [employer] and CSW. Patient has had many social issues.  Please see [facility] Community nurses notes for details.  Will update community nurse of patient's re-admission. Our community based plan of care has focused on disease management and community resource support. Will follow for [facility] Community Care Management ongoing needs. . Of note, [name] services does not replace or interfere with any services that are needed or arranged by inpatient case management or social work.  For additional questions or referrals please contact:  Charlesetta Shanks, RN BSN CCM [facility]  [phone] business mobile phone Toll free office [phone]

## 2016-02-28 NOTE — Progress Notes (Signed)
Physical Therapy Evaluation Patient Details Name: Madison [name] MRN: [id number] DOB: [birth year] Today's Date: 02/28/2016   History of Present Illness  Pt adm with DKA 4/9, just in [facility] for same, DC'd 55/28. PMH - Difficult to control DKA, Rt CVA  Clinical Impression    The patient is pleasant, ambulated with min  Hand hold assist in the room. The patient  Will benefit from PT to address problems listed in the note below. Recommend OT consult. Thepatient states that she wants to Return home at DC.  Pt admitted with above diagnosis. Pt currently with functional limitations due to the deficits listed below (see PT Problem List).  Pt will benefit from skilled PT to increase their independence and safety with mobility to allow discharge to the home  Vs  SNF.     Follow Up Recommendations Home health PT;Supervision for mobility/OOB;SNF (patient reports desires to return home. )    Equipment Recommendations  None recommended by PT    Recommendations for Other Services OT consult     Precautions / Restrictions Precautions Precautions: Fall Restrictions Weight Bearing Restrictions: No      Mobility  Bed Mobility Overal bed mobility: Needs Assistance Bed Mobility: Supine to Sit     Supine to sit: Modified independent (Device/Increase time)     General bed mobility comments: Pt demonstrates good technique, no assistance needed.    Transfers Overall transfer level: Needs assistance Equipment used: 1 person hand held assist Transfers: Sit to/from [employer] Sit to Stand: Min guard Stand pivot transfers: Min assist       General transfer comment: transfer to the L,  extra time, slow for stepping around. transferr to [facility] and back to bed to the R min guard.  Ambulation/Gait Ambulation/Gait assistance: Min assist Ambulation Distance (Feet): 20 Feet Assistive device: 1 person hand held assist Gait Pattern/deviations: Step-to pattern;Decreased dorsiflexion -  left;Decreased weight shift to left Gait velocity: decr   General Gait Details: L leg tends to drag,  steady assist for gait. may do better with [employer]            Wheelchair Mobility    Modified Rankin (Stroke Patients Only)       Balance Overall balance assessment: Needs assistance Sitting-balance support: No upper extremity supported;Feet supported Sitting balance-Leahy Scale: Good Sitting balance - Comments: able to reach forward to table and get cup while sitting edge of bed.   Standing balance support: During functional activity;Single extremity supported Standing balance-Leahy Scale: Fair                               Pertinent Vitals/Pain Pain Assessment: No/denies pain    Home Living Family/patient expects to be discharged to:: Private residence   Available Help at Discharge: Personal care attendant;Friend(s) Type of Home: Apartment Home Access: Stairs to enter Entrance Stairs-Rails: Right Entrance Stairs-Number of Steps: 3 Home Layout: One level Home Equipment: Grab bars - tub/shower;Walker - 2 wheels;Cane - single point;Grab bars - toilet;Bedside commode Additional Comments: patient able to provide info today. Has aide 2 hours/day 6 days a week, states that she has a house mate at night.     Prior Function Level of Independence: Independent with assistive device(s)   Gait / Transfers Assistance Needed: ambulatory without use of device inside home  ADL's / Homemaking Assistance Needed: aide assist with housework  Comments: Reports she has an aide assist with IADLs, driving to appts  etc.  reports that she prepares all of her meals, 55 YO son is staying with sister     Hand Dominance   Dominant Hand: Right    Extremity/Trunk Assessment   Upper Extremity Assessment: LUE deficits/detail       LUE Deficits / Details: increased tone of hand and elbow, gross grip and extension of fingers partial   Lower Extremity Assessment: LLE  deficits/detail   LLE Deficits / Details: residual weakness from previous CVA, increased tone/flexor responses, ankle clonus  Cervical / Trunk Assessment: Other exceptions  Communication   Communication: No difficulties (speeech slightly slurred)  Cognition Arousal/Alertness: Awake/alert Behavior During Therapy: WFL for tasks assessed/performed Overall Cognitive Status: Within Functional Limits for tasks assessed                      General Comments      Exercises        Assessment/Plan    PT Assessment Patient needs continued PT services  PT Diagnosis Generalized weakness   PT Problem List Decreased mobility;Decreased activity tolerance;Decreased range of motion;Decreased strength;Decreased knowledge of use of DME;Decreased safety awareness;Decreased knowledge of precautions;Decreased coordination;Decreased cognition  PT Treatment Interventions Gait training;Functional mobility training;Therapeutic activities;Patient/family education;Therapeutic exercise   PT Goals (Current goals can be found in the Care Plan section) Acute Rehab PT Goals Patient Stated Goal: to go home PT Goal Formulation: With patient Time For Goal Achievement: 03/13/16 Potential to Achieve Goals: Good    Frequency Min 3X/week   Barriers to discharge Decreased caregiver support      Co-evaluation               End of Session   Activity Tolerance: Patient tolerated treatment well Patient left: in chair;with call bell/phone within reach;with chair alarm set Nurse Communication: Mobility status         Time: 0981-1914 PT Time Calculation (min) (ACUTE ONLY): 34 min   Charges:   PT Evaluation $PT Eval Low Complexity: 1 Procedure PT Treatments $Gait Training: 8-22 mins   PT G Codes:        Rada Hay 02/28/2016, 9:08 AM Blanchard Kelch PT [phone]

## 2016-02-28 NOTE — Telephone Encounter (Signed)
Please advise 

## 2016-02-28 NOTE — Progress Notes (Signed)
ANTICOAGULATION CONSULT NOTE   Pharmacy Consult for Warfarin Indication: mechanical aortic valve replacement  Allergies  Allergen Reactions  . Adhesive [Tape] Other (See Comments)    Can burn skin if left on too long  . Celebrex [Celecoxib] Rash  . Detrol [Tolterodine] Hives    Patient Measurements: Height: 5\' 3"  (160 cm) Weight: 149 lb 4 oz (67.7 kg) IBW/kg (Calculated) : 52.4  Vital Signs: Temp: 98.5 F (36.9 C) (04/11 0730) Temp Source: Oral (04/11 0400) BP: 133/50 mmHg (04/11 0800)  Labs:  Recent Labs  02/26/16 1122  02/26/16 2041  02/27/16 0144 02/27/16 0600 02/27/16 0833 02/27/16 1554 02/28/16 0321  HGB 11.1*  --   --   --   --   --  9.7*  --  9.1*  HCT 41.7  --   --   --   --   --  29.7*  --  27.2*  PLT 500*  --   --   --   --   --  302  --  213  LABPROT 33.0*  --   --   --   --   --  39.5*  --  28.7*  INR 3.46*  --   --   --   --   --  4.38*  --  2.87*  CREATININE 1.87*  < >  --   < > 1.00 0.87  --  0.96 0.76  TROPONINI  --   --  0.07*  --  0.11*  --  0.09*  --   --   < > = values in this interval not displayed.  Estimated Creatinine Clearance: 73.4 mL/min (by C-G formula based on Cr of 0.76).   Medications Scheduled:  . antiseptic oral rinse  7 mL Mouth Rinse q12n4p  . chlorhexidine  15 mL Mouth Rinse BID  . insulin aspart  0-20 Units Subcutaneous 6 times per day  . insulin detemir  20 Units Subcutaneous Daily  . pantoprazole (PROTONIX) IV  40 mg Intravenous Q24H  . piperacillin-tazobactam (ZOSYN)  IV  3.375 g Intravenous Q8H  . vancomycin  750 mg Intravenous Q12H  . Warfarin - Pharmacist Dosing Inpatient   Does not apply q1800   Assessment: 37 yoF admitted on 4/9 with DKA, r/o sepsis.  PMH includes mechanical aortic valve replacement on chronic warfarin anticoagulation.  Home warfarin dose is reported as 7.5 mg daily, and she was bridging with Lovenox 70mg  BID.  INR on admission was 3.46, upper end of therapeutic range.  Pharmacy has been consulted  to continue warfarin dosing inpatient.  Today, 02/28/2016:  INR 2.87, within goal range after large decrease (held warfarin x2 days)  CBC: Hgb decreased, Plt WNL.  No bleeding or complications reported.  Diet: carb modified, 75% of meal documented  Drug-drug interactions: broad spectrum antibiotics may increase INR.  Goal of Therapy:  INR 2.5-3.5 (higher INR goal per anticoag clinic notes) Monitor platelets by anticoagulation protocol: Yes   Plan:   Warfarin 7.5 mg PO once today.  Daily INR and CBC.  Pharmacy to f/u daily.  PharmD, BCPS Pager [phone] 02/28/2016 9:20 AM

## 2016-02-28 NOTE — Progress Notes (Signed)
Inpatient Diabetes Program Recommendations  AACE/ADA: New Consensus Statement on Inpatient Glycemic Control (2015)  Target Ranges:  Prepandial:   less than 140 mg/dL      Peak postprandial:   less than 180 mg/dL (1-2 hours)      Critically ill patients:  140 - 180 mg/dL   Results for Madison [name], Madison [name] (MRN [id number]) as of 02/28/2016 11:24  Ref. Range 02/27/2016 23:35 02/28/2016 02:01 02/28/2016 07:40  Glucose-Capillary Latest Ref Range: 65-99 mg/dL 032 (H) 93 122 (H)    Admit with: DKA  History: DM  Home DM Meds: Levemir 20 units daily  Novolog 5 units tidwc  (just started this insulin regimen on discharge from hospital on 02/14/16- Was previously taking 70/30 insulin bidwc)  Current Insulin Orders: Levemir 20 units daily        Novolog Resistant SSI (0-20 units) Q4 hours      -Note patient just discharged from hospital after admission for DKA (03/22 through 03/28). Was previously taking 70/30 insulin bidwc but was switched to Levemir and Novolog on 02/14/15.   -Patient has been followed by [facility] services as an outpatient since that admission. Has been seeing Dr. Romero Belling with [facility] as an outpatient as well. Per [facility] notes from 02/21/16, patient is seeking care under new Endocrinologist. Waiting on referral from her PCP to see new ENDO.  -Spoke with patient this morning about her DM care regimen at home.  Patient confirmed with me that she was started on a new regimen on 02/14/16 at time of d/c from the hospital (Levemir 20 units daily + Novolog 5 units tidwc).  Did not have any questions for me about her new insulin regimen.  Patient also confirmed with me that [facility] is making home visits with her once weekly and that she is trying to have her PCP get her a new Endocrinologist here in [place].  Patient went on to tell me that she thinks she got sick b/c she ate some old food from her refrigerator and  immediately had vomiting after she ate the food.  Stated she took her insulin despite the vomiting b/c her CBGs were high and eventually wound up in the ED with DKA.  -Note CBGs well controlled since midnight on current insulin regimen.      --Will follow patient during hospitalization--  Ambrose Finland RN, MSN, CDE Diabetes Coordinator Inpatient Glycemic Control Team Team Pager: [phone] (8a-5p)

## 2016-02-29 ENCOUNTER — Other Ambulatory Visit: Payer: Self-pay

## 2016-02-29 DIAGNOSIS — E875 Hyperkalemia: Secondary | ICD-10-CM

## 2016-02-29 DIAGNOSIS — I10 Essential (primary) hypertension: Secondary | ICD-10-CM

## 2016-02-29 DIAGNOSIS — E1011 Type 1 diabetes mellitus with ketoacidosis with coma: Principal | ICD-10-CM

## 2016-02-29 DIAGNOSIS — E871 Hypo-osmolality and hyponatremia: Secondary | ICD-10-CM

## 2016-02-29 DIAGNOSIS — I5022 Chronic systolic (congestive) heart failure: Secondary | ICD-10-CM

## 2016-02-29 LAB — CBC WITH DIFFERENTIAL/PLATELET
Basophils Absolute: 0 10*3/uL (ref 0.0–0.1)
Basophils Relative: 0 %
EOS PCT: 1 %
Eosinophils Absolute: 0.1 10*3/uL (ref 0.0–0.7)
HCT: 32.1 % — ABNORMAL LOW (ref 36.0–46.0)
Hemoglobin: 10.4 g/dL — ABNORMAL LOW (ref 12.0–15.0)
LYMPHS ABS: 2.5 10*3/uL (ref 0.7–4.0)
LYMPHS PCT: 39 %
MCH: 26 pg (ref 26.0–34.0)
MCHC: 32.4 g/dL (ref 30.0–36.0)
MCV: 80.3 fL (ref 78.0–100.0)
MONO ABS: 0.3 10*3/uL (ref 0.1–1.0)
Monocytes Relative: 5 %
Neutro Abs: 3.6 10*3/uL (ref 1.7–7.7)
Neutrophils Relative %: 55 %
PLATELETS: 238 10*3/uL (ref 150–400)
RBC: 4 MIL/uL (ref 3.87–5.11)
RDW: 20.1 % — AB (ref 11.5–15.5)
WBC: 6.5 10*3/uL (ref 4.0–10.5)

## 2016-02-29 LAB — BASIC METABOLIC PANEL
Anion gap: 10 (ref 5–15)
BUN: 8 mg/dL (ref 6–20)
CHLORIDE: 106 mmol/L (ref 101–111)
CO2: 23 mmol/L (ref 22–32)
Calcium: 8.8 mg/dL — ABNORMAL LOW (ref 8.9–10.3)
Creatinine, Ser: 0.54 mg/dL (ref 0.44–1.00)
GFR calc Af Amer: >60 mL/min (ref >60)
GLUCOSE: 156 mg/dL — AB (ref 65–99)
POTASSIUM: 4.2 mmol/L (ref 3.5–5.1)
Sodium: 139 mmol/L (ref 135–145)

## 2016-02-29 LAB — PROTIME-INR
INR: 1.83 — AB (ref 0.00–1.49)
Prothrombin Time: 21.1 seconds — ABNORMAL HIGH (ref 11.6–15.2)

## 2016-02-29 LAB — GLUCOSE, CAPILLARY
GLUCOSE-CAPILLARY: 208 mg/dL — AB (ref 65–99)
GLUCOSE-CAPILLARY: 140 mg/dL — AB (ref 65–99)
Glucose-Capillary: 124 mg/dL — ABNORMAL HIGH (ref 65–99)
Glucose-Capillary: 241 mg/dL — ABNORMAL HIGH (ref 65–99)
Glucose-Capillary: 25 mg/dL — CL (ref 65–99)
Glucose-Capillary: 98 mg/dL (ref 65–99)

## 2016-02-29 LAB — LACTIC ACID, PLASMA: Lactic Acid, Venous: 1.2 mmol/L (ref 0.5–2.0)

## 2016-02-29 LAB — MAGNESIUM: Magnesium: 2.1 mg/dL (ref 1.7–2.4)

## 2016-02-29 MED ORDER — INSULIN ASPART 100 UNIT/ML ~~LOC~~ SOLN
5.0000 [IU] | Freq: Three times a day (TID) | SUBCUTANEOUS | Status: DC
  Administered 2016-02-29 – 2016-03-01 (×3): 5 [IU] via SUBCUTANEOUS

## 2016-02-29 MED ORDER — WARFARIN SODIUM 5 MG PO TABS
7.5000 mg | ORAL_TABLET | Freq: Once | ORAL | Status: AC
  Administered 2016-02-29: 7.5 mg via ORAL
  Filled 2016-02-29: qty 1

## 2016-02-29 MED ORDER — DEXTROSE 50 % IV SOLN
INTRAVENOUS | Status: AC
  Administered 2016-02-29: 50 mL
  Filled 2016-02-29: qty 50

## 2016-02-29 NOTE — Progress Notes (Signed)
Hypoglycemic Event  CBG: 2205- 55  Treatment: 15 GM carbohydrate snack  Symptoms: None  Follow-up CBG: Time:2245 CBG Result: 85  Possible Reasons for Event: Unknown  Comments/MD notified: none    Madison [name]

## 2016-02-29 NOTE — Care Management Important Message (Signed)
Important Message  Patient Details  Name: Madison [name] MRN: [id number] Date of Birth: 19-Mar-1961   Medicare Important Message Given:  Yes    Haskell Flirt 02/29/2016, 10:40 AMImportant Message  Patient Details  Name: Madison [name] MRN: [id number] Date of Birth: [birth year]   Medicare Important Message Given:  Yes    Haskell Flirt 02/29/2016, 10:40 AM

## 2016-02-29 NOTE — Progress Notes (Signed)
   Patient remains hospitalized. Will continue to assess for disposition

## 2016-02-29 NOTE — Progress Notes (Signed)
ANTICOAGULATION CONSULT NOTE   Pharmacy Consult for Warfarin Indication: mechanical aortic valve replacement  Allergies  Allergen Reactions  . Adhesive [Tape] Other (See Comments)    Can burn skin if left on too long  . Celebrex [Celecoxib] Rash  . Detrol [Tolterodine] Hives    Patient Measurements: Height: 5\' 3"  (160 cm) Weight: 149 lb 4 oz (67.7 kg) IBW/kg (Calculated) : 52.4  Vital Signs: Temp: 98.2 F (36.8 C) (04/12 0507) Temp Source: Oral (04/12 0507) BP: 144/55 mmHg (04/12 0507) Pulse Rate: 58 (04/12 0507)  Labs:  Recent Labs  02/26/16 2041  02/27/16 0144  02/27/16 0833 02/27/16 1554 02/28/16 0321 02/29/16 0458  HGB  --   --   --   --  9.7*  --  9.1* 10.4*  HCT  --   --   --   --  29.7*  --  27.2* 32.1*  PLT  --   --   --   --  302  --  213 238  LABPROT  --   --   --   --  39.5*  --  28.7* 21.1*  INR  --   --   --   --  4.38*  --  2.87* 1.83*  CREATININE  --   < > 1.00  < >  --  0.96 0.76 0.54  TROPONINI 0.07*  --  0.11*  --  0.09*  --   --   --   < > = values in this interval not displayed.  Estimated Creatinine Clearance: 73.4 mL/min (by C-G formula based on Cr of 0.54).   Medications Scheduled:  . antiseptic oral rinse  7 mL Mouth Rinse q12n4p  . cefTRIAXone (ROCEPHIN)  IV  1 g Intravenous Daily  . chlorhexidine  15 mL Mouth Rinse BID  . darifenacin  7.5 mg Oral Daily  . insulin aspart  0-20 Units Subcutaneous TID AC & HS  . insulin detemir  20 Units Subcutaneous Daily  . pantoprazole  40 mg Oral Q0600  . pravastatin  20 mg Oral Daily  . senna-docusate  1 tablet Oral BID  . Warfarin - Pharmacist Dosing Inpatient   Does not apply q1800   Assessment: 54 yoF admitted on 4/9 with DKA, r/o sepsis.  PMH includes mechanical aortic valve replacement on chronic warfarin anticoagulation.  Home warfarin dose is reported as 7.5 mg daily, and she was bridging with Lovenox 70mg  BID.  INR on admission was 3.46, upper end of therapeutic range.  Pharmacy has been  consulted to continue warfarin dosing inpatient.  Today, 02/29/2016:  INR decreased from 2.87 to 1.83 with warfarin resumed on 4/11, (held warfarin x2 days d/t supra-therapeutic INR)  CBC relatively stable  No bleeding documented  Diet: carb modified, 75% of meal documented  Drug-drug interactions: on abx  Goal of Therapy:  INR 2.5-3.5 (higher INR goal per anticoag clinic notes) Monitor platelets by anticoagulation protocol: Yes   Plan:   Repeat Warfarin 7.5 mg PO once today.   Consider bridging with heparin or lovenox if INR continues to trend down tomorrow.  Daily INR and CBC.  F/u s/s of bleeding  04/30/2016, PharmD, BCPS 02/29/2016 10:09 AM

## 2016-02-29 NOTE — Progress Notes (Signed)
PROGRESS NOTE  Madison [name] IEP:[id number] DOB: 13-Apr-1961 DOA: 02/26/2016 PCP: [name], NP Brief History 55 year old female with a history of diabetes mellitus type 2, CAD, h/o strokes (residual slurred/slowed speech), h/o AVR on warfarin, coronary artery disease status post CABG, stroke with left Middle purses, systolic and diastolic CHF, and hypertension  presented to emergency department 02/26/16 unresponsive.  Patient presented with a serum glucose of 1204 and bicarbonate less than 7. The patient was noted to have lactic acid of 10.38. She was given 2 L fluid bolus and calcium gluconate for her hyperkalemia. Sister says she spoke with her last night and she was reportedly normal but no one has seen her since last Wednesday. Sister reports she eats more, specifically carbohydrates, than she admits. She is very inactive per her sister's report. Social Services has placed her son in her sister's home. She was started on intravenous fluids and intravenous insulin with clinical improvement. The patient has had numerous admissions for DKA.   Assessment/Plan: DKA in type 1 DM -Initial bicarb <7 with serum glucose of 1204 -Anion gap has closed -Transitioned to subcutaneous insulin -transition to home dose levemir 20 units daily -Continue intravenous fluids for now-->saline lock -Question patient compliance;  Pt eats more carbs that she admits according to sister -11/23/2015 hemoglobin A1c 12.3 -01/28/16 A1C - 9.9 - Patient previously follows with Dr. 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 First diagnosed 64 and has been on insulin's since. - As per review of his records, she was on insulin pump treatment until CVAs prevented her from using the device in early 2014, in early 2015 she was changed to a simple insulin schedule, due to poor results with multiple daily injections. In 2015, she change from Levemir to NPH  due to the pattern of her CBG's.  -She has been on 70/30 until switched back to levemir during her 02/08/16 admission -add novolog 5 units with meals  Chronic systolic and diastolic CHF/ischemic cardiomyopathy/ -08/01/2015 echo--EF 45-50, grade 2 DD,Hypokinesis of the mid-apicalanteroseptal and anterior myocardium -Daily weights -Plan to resume lisinopril after discharge  Acute kidney injury -Secondary to volume depletion -Based on creatinine 0.6-0.8 -resolved  Mechanical AVR -INR 3.46  on day of admission -INR 1.83 4/12 -due to poor social situation and question reliability-->hold d/c for today -PharmD to assist with coumadin  Elevated troponin -Secondary to demand ischemia  -08/01/2015 with a EF of 45-50% with hypokinesis of the mid apical anterior septal and anterior myocardium with grade 2 diastolic dysfunction  Coronary artery disease s/p CABG -No anginal symptoms presently -Continue statin Hyperlipidemia  -Continue statin  Rheumatoid Arthritis - does not take Rheumatrex at home Prior CVA - Continue Coumadin anticoagulation and low-dose aspirin. Anemia of chronic disease -02/12/16--B12 510, folate 9.3, iron sat 27% leukocytosis -Likely stress demargination. -She was afebrile and hemodynamically stable -Blood cultures negative -Urine culture negative -Discontinue antibiotics and observe clinically Hyperkalemia -resolved. Acute metabolic encephalopathy -Resolved  Family Communication:   Pt at beside--total time 35 min; >50% spent counseling and coordinating care Disposition Plan:   Home 4/13 if stable     Procedures/Studies: Ct Abdomen Pelvis Wo Contrast  02/09/2016  CLINICAL DATA:  Vomiting and lactic acidosis. Diabetic. On Coumadin. EXAM: CT ABDOMEN AND PELVIS WITHOUT CONTRAST TECHNIQUE: Multidetector CT imaging of the abdomen and pelvis was performed following the standard protocol without IV contrast. COMPARISON:  KUB 11/23/2015 FINDINGS: Lower chest:  Minimal  pleural effusion bilaterally with minimal bibasilar atelectasis. Negative for pneumonia. Aortic valve replacement. Hepatobiliary: Unenhanced images of the liver show normal liver size. No focal liver lesion. Small layering calcified gallstones without gallbladder wall thickening. Bile ducts nondilated. Pancreas: Negative Spleen: Negative Adrenals/Urinary Tract: No renal mass or obstruction on unenhanced imaging. Extensive vascular calcification of the renal arteries. Urinary bladder is normal with a Foley catheter. Stomach/Bowel: Negative for bowel obstruction. No bowel edema or mass. Negative for diverticulitis. Appendix not visualized. Vascular/Lymphatic: Extensive vascular calcification due to diabetes. No aortic aneurysm. Reproductive: Calcified uterine fibroids. The largest fibroid is posterior fundus measuring 26 mm. No adnexal mass. Other: No free fluid.  Small umbilical hernia. Musculoskeletal: Negative IMPRESSION: No acute abnormality in the abdomen Cholelithiasis. Electronically Signed   By: Marlan Palau M.D.   On: 02/09/2016 14:13   Ct Head Wo Contrast  02/26/2016  CLINICAL DATA:  Acute altered mental status with DKA. Acute encephalopathy. EXAM: CT HEAD WITHOUT CONTRAST TECHNIQUE: Contiguous axial images were obtained from the base of the skull through the vertex without contrast. COMPARISON:  02/09/2016 FINDINGS: Extensive encephalomalacia in the posterior right temporal lobe and right parietal lobe. There is also encephalomalacia in the right frontal lobe. 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 Encephalomalacia in the left occipital lobe and the left anterior parietal lobe. Stable cerebral atrophy. No evidence for acute hemorrhage, mass lesion, midline shift or hydrocephalus. Difficult to evaluate for an acute infarct based on the old ischemic changes. There is also diffuse low density throughout the white matter. Visualized sinuses are clear. No calvarial fracture. IMPRESSION: No  acute intracranial abnormality. Extensive chronic ischemic changes with widespread encephalomalacia. Electronically Signed   By: Richarda Overlie M.D.   On: 02/26/2016 13:41   Ct Head Wo Contrast  02/09/2016  CLINICAL DATA:  55 year old female with confusion, vomiting, lactic acidosis. Initial encounter. EXAM: CT HEAD WITHOUT CONTRAST TECHNIQUE: Contiguous axial images were obtained from the base of the skull through the vertex without intravenous contrast. COMPARISON:  Head CT without contrast 07/30/2015 and earlier FINDINGS: Small volume retained secretions in the nasopharynx. Mild ethmoid sinus mucosal thickening is new. Mastoids are clear. Tympanic cavities are clear. No acute osseous abnormality identified. No acute orbit soft tissue findings. Broad-based posterior scalp soft tissue swelling measuring up to 5 mm in thickness, most resembling hematoma (series 3, image 71). Underlying calvarium appears intact. Calcified atherosclerosis at the skull base. Encephalomalacia related to large chronic right MCA territory infarcts. Chronic left greater than right PCA and superior left MCA chronic infarcts small chronic left greater than right cerebellar infarcts. Stable cerebral volume. No ventriculomegaly. No midline shift, mass effect, or evidence of intracranial mass lesion. No acute intracranial hemorrhage identified. No cortically based acute infarct identified. No suspicious intracranial vascular hyperdensity. IMPRESSION: 1. Stable non contrast CT appearance of the brain. Advanced chronic ischemic disease with widespread encephalomalacia. 2. Broad-based posterior scalp soft tissue thickening most resembling scalp hematoma. No underlying fracture. Electronically Signed   By: Odessa Fleming M.D.   On: 02/09/2016 14:11   Ct Chest Wo Contrast  02/26/2016  CLINICAL DATA:  Short of breath. Rapid breathing. Altered mental status. EXAM: CT CHEST WITHOUT CONTRAST TECHNIQUE: Multidetector CT imaging of the chest was performed  following the standard protocol without IV contrast. COMPARISON:  02/11/2016 FINDINGS: Neck base and axilla: Small low-density lesions in the thyroid consistent with sub cm cysts. No neck base or axillary masses or adenopathy. Mediastinum/Lymph Nodes: Heart normal in size. There are dense coronary artery  calcifications and changes from previous cardiac surgery and aortic valve replacement. Great vessels are not normal in caliber. No mediastinal or hilar masses or pathologically enlarged lymph nodes. Lungs/Pleura: No pulmonary mass, infiltrate, or effusion. Upper abdomen: No acute findings. Musculoskeletal: Mild degenerative changes of the visualized spine. No fractures. No osteoblastic or osteolytic lesions. IMPRESSION: 1. No acute findings.  Lungs are clear. Electronically Signed   By: Amie Portland M.D.   On: 02/26/2016 13:35   Dg Chest Port 1 View  02/11/2016  CLINICAL DATA:  Confusion EXAM: PORTABLE CHEST 1 VIEW COMPARISON:  02/08/2016 FINDINGS: Cardiomediastinal silhouette is stable. Status post median sternotomy and aortic valve replacement. Right IJ catheter with tip in right atrium. No infiltrate or pulmonary edema. No pneumothorax. IMPRESSION: No active disease. Right IJ catheter with tip in right atrium. Status post median sternotomy. Electronically Signed   By: Natasha Mead M.D.   On: 02/11/2016 12:57   Dg Chest Port 1 View  02/08/2016  CLINICAL DATA:  Central line placement. EXAM: PORTABLE CHEST 1 VIEW COMPARISON:  Portable film earlier today. FINDINGS: RIGHT IJ catheter tip lies in the proximal RIGHT atrium. No pneumothorax. Unchanged aeration. Prior median sternotomy. IMPRESSION: Satisfactory appearance status post RIGHT IJ catheter placement. Tip lies in the proximal RIGHT atrium. Electronically Signed   By: Elsie Stain M.D.   On: 02/08/2016 13:09   Dg Chest Portable 1 View  02/08/2016  CLINICAL DATA:  55 year old female with a history of shortness of breath and weakness EXAM: PORTABLE CHEST  1 VIEW COMPARISON:  Multiple prior chest x-ray comparison, most recent 01/27/2016 FINDINGS: Cardiomediastinal silhouette unchanged in size and contour. Surgical changes of prior median sternotomy and CABG. There is uplifting of the minor fissure with peritracheal/paramediastinal density superior to the right mainstem. This is more pronounced than on the comparison plain film. No left-sided confluent airspace disease. No large pleural effusion. No pneumothorax. IMPRESSION: Uplifting of the minor fissure with density along the paramediastinal stripe, concerning for partial volume loss of the right upper lobe. This may reflect mucous plugging in the setting of infection, however, central obstructing mass cannot be excluded. Recommend further evaluation with contrast-enhanced CT. Surgical changes of median sternotomy and CABG. Signed, Yvone Neu. Loreta Ave, DO [facility] Electronically Signed   By: Gilmer Mor D.O.   On: 02/08/2016 12:18         Subjective: Patient denies fevers, chills, headache, chest pain, dyspnea, nausea, vomiting, diarrhea, abdominal pain, dysuria, hematuria   Objective: Filed Vitals:   02/28/16 1624 02/28/16 1708 02/28/16 2100 02/29/16 0507  BP: 115/46 116/63 121/60 144/55  Pulse:  80 42 58  Temp: 98.5 F (36.9 C) 98.5 F (36.9 C) 98.3 F (36.8 C) 98.2 F (36.8 C)  TempSrc: Oral Oral Oral Oral  Resp: 21 20 18 20   Height:  5\' 3"  (1.6 m)    Weight:      SpO2: 99% 100% 100% 100%   No intake or output data in the 24 hours ending 02/29/16 1525 Weight change:  Exam:   General:  Pt is alert, follows commands appropriately, not in acute distress  HEENT: No icterus, No thrush, No neck mass, [place]/AT  Cardiovascular: RRR, S1/S2, no rubs, no gallops  Respiratory: CTA bilaterally, no wheezing, no crackles, no rhonchi  Abdomen: Soft/+BS, non tender, non distended, no guarding  Extremities: trace LE edema, No  lymphangitis, No petechiae, No rashes, no synovitis  Data Reviewed: Basic Metabolic Panel:  Recent Labs Lab 02/26/16 2041  02/26/16 2229 02/27/16 0144 02/27/16 0600 02/27/16 0833 02/27/16 1554 02/28/16 0321 02/29/16 0458  NA  --  147* 144 139  --  136 141 139  K  --  3.3* 2.9* 3.8  --  3.0* 3.7 4.2  CL  --  114* 112* 108  --  103 109 106  CO2  --  19* 24 21*  --  23 22 23   GLUCOSE  --  176* 98 156*  --  129* 139* 156*  BUN  --  16 13 12   --  7 5* 8  CREATININE  --  1.13* 1.00 0.87  --  0.96 0.76 0.54  CALCIUM  --  8.4* 8.0* 7.3*  --  7.4* 7.6* 8.8*  MG 1.5* 1.6*  --   --  1.9  --  1.8 2.1  PHOS  --  <1.0*  --   --   --   --  3.7  --    Liver Function Tests:  Recent Labs Lab 02/26/16 1122  AST 26  ALT 20  ALKPHOS 121  BILITOT 1.4*  PROT 7.5  ALBUMIN 4.0   No results for input(s): LIPASE, AMYLASE in the last 168 hours. No results for input(s): AMMONIA in the last 168 hours. CBC:  Recent Labs Lab 02/26/16 1122 02/27/16 0833 02/28/16 0321 02/29/16 0458  WBC 15.2* 16.9* 10.2 6.5  NEUTROABS 10.3*  --   --  3.6  HGB 11.1* 9.7* 9.1* 10.4*  HCT 41.7 29.7* 27.2* 32.1*  MCV 96.1 80.9 79.1 80.3  PLT 500* 302 213 238   Cardiac Enzymes:  Recent Labs Lab 02/26/16 2041 02/27/16 0144 02/27/16 0833  TROPONINI 0.07* 0.11* 0.09*   BNP: Invalid input(s): POCBNP CBG:  Recent Labs Lab 02/28/16 2205 02/28/16 2245 02/29/16 0454 02/29/16 0826 02/29/16 1201  GLUCAP 55* 84 124* 241* 208*    Recent Results (from the past 240 hour(s))  Blood Culture (routine x 2)     Status: None (Preliminary result)   Collection Time: 02/26/16  1:06 PM  Result Value Ref Range Status   Specimen Description BLOOD RIGHT FOREARM  Final   Special Requests IN PEDIATRIC BOTTLE 2CC  Final   Culture   Final    NO GROWTH 3 DAYS Performed at [facility]    Report Status PENDING  Incomplete  MRSA PCR Screening     Status: None   Collection Time: 02/26/16  2:34 PM  Result Value  Ref Range Status   MRSA by PCR NEGATIVE NEGATIVE Final    Comment:        The GeneXpert MRSA Assay (FDA approved for NASAL specimens only), is one component of a comprehensive MRSA colonization surveillance program. It is not intended to diagnose MRSA infection nor to guide or monitor treatment for MRSA infections.   Blood Culture (routine x 2)     Status: None (Preliminary result)   Collection Time: 02/26/16  3:35 PM  Result Value Ref Range Status   Specimen Description BLOOD RIGHT ANTECUBITAL  Final   Special Requests IN PEDIATRIC BOTTLE 1CC  Final   Culture   Final    NO GROWTH 3 DAYS Performed at [facility]    Report Status PENDING  Incomplete  Urine culture     Status: None   Collection Time: 02/26/16  4:00 PM  Result Value Ref Range Status   Specimen Description URINE, CATHETERIZED  Final   Special Requests NONE  Final   Culture   Final  NO GROWTH 2 DAYS Performed at [facility]    Report Status 02/28/2016 FINAL  Final     Scheduled Meds: . antiseptic oral rinse  7 mL Mouth Rinse q12n4p  . chlorhexidine  15 mL Mouth Rinse BID  . darifenacin  7.5 mg Oral Daily  . insulin aspart  0-20 Units Subcutaneous TID AC & HS  . insulin detemir  20 Units Subcutaneous Daily  . pantoprazole  40 mg Oral Q0600  . pravastatin  20 mg Oral Daily  . senna-docusate  1 tablet Oral BID  . warfarin  7.5 mg Oral ONCE-1800  . Warfarin - Pharmacist Dosing Inpatient   Does not apply q1800   Continuous Infusions:    Fed Ceci, DO  [facility] Pager [phone]  If 7PM-7AM, please contact night-coverage [email or web address] Password [email or web address] 02/29/2016, 3:25 PM   LOS: 3 days

## 2016-03-01 DIAGNOSIS — Z954 Presence of other heart-valve replacement: Secondary | ICD-10-CM

## 2016-03-01 DIAGNOSIS — N179 Acute kidney failure, unspecified: Secondary | ICD-10-CM

## 2016-03-01 LAB — BASIC METABOLIC PANEL
Anion gap: 8 (ref 5–15)
BUN: 8 mg/dL (ref 6–20)
CALCIUM: 9.3 mg/dL (ref 8.9–10.3)
CO2: 25 mmol/L (ref 22–32)
CREATININE: 0.63 mg/dL (ref 0.44–1.00)
Chloride: 112 mmol/L — ABNORMAL HIGH (ref 101–111)
Glucose, Bld: 97 mg/dL (ref 65–99)
Potassium: 4.8 mmol/L (ref 3.5–5.1)
SODIUM: 145 mmol/L (ref 135–145)

## 2016-03-01 LAB — GLUCOSE, CAPILLARY
GLUCOSE-CAPILLARY: 233 mg/dL — AB (ref 65–99)
Glucose-Capillary: 82 mg/dL (ref 65–99)
Glucose-Capillary: 143 mg/dL — ABNORMAL HIGH (ref 65–99)

## 2016-03-01 LAB — CBC
HCT: 30.9 % — ABNORMAL LOW (ref 36.0–46.0)
Hemoglobin: 9.9 g/dL — ABNORMAL LOW (ref 12.0–15.0)
MCH: 26.3 pg (ref 26.0–34.0)
MCHC: 32 g/dL (ref 30.0–36.0)
MCV: 82 fL (ref 78.0–100.0)
PLATELETS: 190 10*3/uL (ref 150–400)
RBC: 3.77 MIL/uL — AB (ref 3.87–5.11)
RDW: 19.9 % — ABNORMAL HIGH (ref 11.5–15.5)
WBC: 5.8 10*3/uL (ref 4.0–10.5)

## 2016-03-01 LAB — PROTIME-INR
INR: 2.72 — AB (ref 0.00–1.49)
PROTHROMBIN TIME: 28.5 s — AB (ref 11.6–15.2)

## 2016-03-01 LAB — MAGNESIUM: MAGNESIUM: 2 mg/dL (ref 1.7–2.4)

## 2016-03-01 MED ORDER — DARIFENACIN HYDROBROMIDE ER 7.5 MG PO TB24: 7.5000 mg | ORAL_TABLET | Freq: Every day | ORAL | Status: DC

## 2016-03-01 MED ORDER — WARFARIN SODIUM 5 MG PO TABS: 5.0000 mg | ORAL_TABLET | Freq: Every day | ORAL | Status: DC

## 2016-03-01 MED ORDER — WARFARIN SODIUM 5 MG PO TABS: 5.0000 mg | ORAL_TABLET | Freq: Once | ORAL | Status: DC

## 2016-03-01 NOTE — Discharge Summary (Signed)
Physician Discharge Summary  Madison [name] YHC:[id number] DOB: 05-09-1961 DOA: 02/26/2016  PCP: [name], NP  Admit date: 02/26/2016 Discharge date: 03/01/2016  Recommendations for Outpatient Follow-up:  1. Pt will need to follow up with PCP in 2 weeks post discharge 2. Please obtain BMP, CBC, INR on 03/05/16 3. Please follow up with coumadin clinic on 03/05/16 and adjust coumadin for INR 2.5-3.5  Discharge Diagnoses:  DKA in type 1 DM -Initial bicarb <7 with serum glucose of 1204 -Anion gap has closed -Transitioned to subcutaneous insulin -transition to home dose levemir 20 units daily -Continue intravenous fluids for now-->saline lock -Question patient compliance; Pt eats more carbs that she admits according to sister -11/23/2015 hemoglobin A1c 12.3 -01/28/16 A1C - 9.9 - Patient previously follows with Dr. 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 First diagnosed 66 and has been on insulin's since. - As per review of his records, she was on insulin pump treatment until CVAs prevented her from using the device in early 2014, in early 2015 she was changed to a simple insulin schedule, due to poor results with multiple daily injections. In 2015, she change from Levemir to NPH due to the pattern of her CBG's.  -She has been on 70/30 until switched back to levemir during her 02/08/16 admission -add novolog 5 units with meals  Chronic systolic and diastolic CHF/ischemic cardiomyopathy/ -08/01/2015 echo--EF 45-50, grade 2 DD,Hypokinesis of the mid-apicalanteroseptal and anterior myocardium -Daily weights -Plan to resume lisinopril after discharge  Acute kidney injury -Secondary to volume depletion -Based on creatinine 0.6-0.8 -resolved  Mechanical AVR -INR 3.46 on day of admission -INR 2.72 on day of d/c  -PharmD to assist with coumadin-->home with 5 mg daily--follow up at coumadin clinic 03/05/16  Elevated  troponin -Secondary to demand ischemia  -08/01/2015 with a EF of 45-50% with hypokinesis of the mid apical anterior septal and anterior myocardium with grade 2 diastolic dysfunction  Coronary artery disease s/p CABG -No anginal symptoms presently -Continue statin Hyperlipidemia  -Continue statin  Rheumatoid Arthritis - does not take Rheumatrex at home Prior CVA - Continue Coumadin anticoagulation and low-dose aspirin. Anemia of chronic disease -02/12/16--B12 510, folate 9.3, iron sat 27% leukocytosis -Likely stress demargination. -She was afebrile and hemodynamically stable -Blood cultures negative -Urine culture negative -Discontinue antibiotics and observe clinically Hyperkalemia -resolved. Acute metabolic encephalopathy -Resolved  Discharge Condition: stable  Disposition: home  Diet:carb modified Wt Readings from Last 3 Encounters:  03/01/16 63.866 kg (140 lb 12.8 oz)  02/14/16 68.8 kg (151 lb 10.8 oz)  02/06/16 65.227 kg (143 lb 12.8 oz)    History of present illness:  55 year old female with a history of diabetes mellitus type 2, CAD, h/o strokes (residual slurred/slowed speech), h/o AVR on warfarin, coronary artery disease status post CABG, stroke with left Middle purses, systolic and diastolic CHF, and hypertension presented to emergency department 02/26/16 unresponsive. Patient presented with a serum glucose of 1204 and bicarbonate less than 7. The patient was noted to have lactic acid of 10.38. She was given 2 L fluid bolus and calcium gluconate for her hyperkalemia. Sister says she spoke with her last night and she was reportedly normal but no one has seen her since last Wednesday. Sister reports she eats more, specifically carbohydrates, than she admits. She is very inactive per her sister's report. Social Services has placed her son in her sister's home. She was started on intravenous fluids and intravenous insulin with clinical improvement. The patient  has had  numerous admissions for DKA.leading one to believe that there is a degree of non-compliance. The patient's warfarin was managed with the assistance of pharmacy during the hospitalization.Her INR was 2.72 on day of discharge. The patient will go home with Warfarin 5 mg daily until she follows up with her Coumadin clinic on 03/05/2016. Discharge Exam: Filed Vitals:   02/29/16 2048 03/01/16 0529  BP: 132/52 142/57  Pulse: 72 64  Temp: 98 F (36.7 C) 98.6 F (37 C)  Resp: 18 20   Filed Vitals:   02/29/16 0507 02/29/16 1546 02/29/16 2048 03/01/16 0529  BP: 144/55 123/57 132/52 142/57  Pulse: 58 70 72 64  Temp: 98.2 F (36.8 C) 99.8 F (37.7 C) 98 F (36.7 C) 98.6 F (37 C)  TempSrc: Oral Oral Oral Oral  Resp: 20 18 18 20   Height:      Weight:    63.866 kg (140 lb 12.8 oz)  SpO2: 100% 100% 93% 100%   General: A&O x 3, NAD, pleasant, cooperative Cardiovascular: RRR, no rub, no gallop, no S3 Respiratory: CTAB, no wheeze, no rhonchi Abdomen:soft, nontender, nondistended, positive bowel sounds Extremities: No edema, No lymphangitis, no petechiae  Discharge Instructions      Discharge Instructions    Ambulatory referral to Nutrition and Diabetic Education    Complete by:  As directed   A1c 9.9% back in March 2017.  Takes Levemir and Novolog at home.  Admit with DKA.  Current ENDO: Dr. April 2017.  [facility] following at home as well.  Thanks!     Diet - low sodium heart healthy    Complete by:  As directed      Increase activity slowly    Complete by:  As directed             Medication List    STOP taking these medications        enoxaparin 80 MG/0.8ML injection  Commonly known as:  LOVENOX      TAKE these medications        acetaminophen 325 MG tablet  Commonly known as:  TYLENOL  Take 2 tablets (650 mg total) by mouth every 6 (six) hours as needed for mild pain (temp > 101.5).     escitalopram 20 MG tablet  Commonly known as:  LEXAPRO  Take 1 tablet (20 mg total)  by mouth daily.     insulin aspart 100 UNIT/ML injection  Commonly known as:  novoLOG  Inject 5 Units into the skin 3 (three) times daily with meals.     insulin detemir 100 UNIT/ML injection  Commonly known as:  LEVEMIR  Inject 0.2 mLs (20 Units total) into the skin daily.     lisinopril 2.5 MG tablet  Commonly known as:  PRINIVIL,ZESTRIL  Take 2.5 mg by mouth daily.     omeprazole 40 MG capsule  Commonly known as:  PRILOSEC  TAKE 1 CAPSULE (40 MG TOTAL) BY MOUTH DAILY.     ondansetron 4 MG tablet  Commonly known as:  ZOFRAN  Take 1 tablet (4 mg total) by mouth every 8 (eight) hours as needed for nausea or vomiting.     pravastatin 20 MG tablet  Commonly known as:  PRAVACHOL  Take 1 tablet (20 mg total) by mouth daily.     solifenacin 5 MG tablet  Commonly known as:  VESICARE  Take 1 tablet (5 mg total) by mouth daily.     traZODone 50 MG tablet  Commonly known  as:  DESYREL  Take 50 mg by mouth at bedtime.     warfarin 5 MG tablet  Commonly known as:  COUMADIN  Take 1 tablet (5 mg total) by mouth daily.         The results of significant diagnostics from this hospitalization (including imaging, microbiology, ancillary and laboratory) are listed below for reference.    Significant Diagnostic Studies: Ct Abdomen Pelvis Wo Contrast  02/09/2016  CLINICAL DATA:  Vomiting and lactic acidosis. Diabetic. On Coumadin. EXAM: CT ABDOMEN AND PELVIS WITHOUT CONTRAST TECHNIQUE: Multidetector CT imaging of the abdomen and pelvis was performed following the standard protocol without IV contrast. COMPARISON:  KUB 11/23/2015 FINDINGS: Lower chest: Minimal pleural effusion bilaterally with minimal bibasilar atelectasis. Negative for pneumonia. Aortic valve replacement. Hepatobiliary: Unenhanced images of the liver show normal liver size. No focal liver lesion. Small layering calcified gallstones without gallbladder wall thickening. Bile ducts nondilated. Pancreas: Negative Spleen:  Negative Adrenals/Urinary Tract: No renal mass or obstruction on unenhanced imaging. Extensive vascular calcification of the renal arteries. Urinary bladder is normal with a Foley catheter. Stomach/Bowel: Negative for bowel obstruction. No bowel edema or mass. Negative for diverticulitis. Appendix not visualized. Vascular/Lymphatic: Extensive vascular calcification due to diabetes. No aortic aneurysm. Reproductive: Calcified uterine fibroids. The largest fibroid is posterior fundus measuring 26 mm. No adnexal mass. Other: No free fluid.  Small umbilical hernia. Musculoskeletal: Negative IMPRESSION: No acute abnormality in the abdomen Cholelithiasis. Electronically Signed   By: Marlan Palau M.D.   On: 02/09/2016 14:13   Ct Head Wo Contrast  02/26/2016  CLINICAL DATA:  Acute altered mental status with DKA. Acute encephalopathy. EXAM: CT HEAD WITHOUT CONTRAST TECHNIQUE: Contiguous axial images were obtained from the base of the skull through the vertex without contrast. COMPARISON:  02/09/2016 FINDINGS: Extensive encephalomalacia in the posterior right temporal lobe and right parietal lobe. There is also encephalomalacia in the right frontal lobe. 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 Encephalomalacia in the left occipital lobe and the left anterior parietal lobe. Stable cerebral atrophy. No evidence for acute hemorrhage, mass lesion, midline shift or hydrocephalus. Difficult to evaluate for an acute infarct based on the old ischemic changes. There is also diffuse low density throughout the white matter. Visualized sinuses are clear. No calvarial fracture. IMPRESSION: No acute intracranial abnormality. Extensive chronic ischemic changes with widespread encephalomalacia. Electronically Signed   By: Richarda Overlie M.D.   On: 02/26/2016 13:41   Ct Head Wo Contrast  02/09/2016  CLINICAL DATA:  55 year old female with confusion, vomiting, lactic acidosis. Initial encounter. EXAM: CT HEAD WITHOUT CONTRAST  TECHNIQUE: Contiguous axial images were obtained from the base of the skull through the vertex without intravenous contrast. COMPARISON:  Head CT without contrast 07/30/2015 and earlier FINDINGS: Small volume retained secretions in the nasopharynx. Mild ethmoid sinus mucosal thickening is new. Mastoids are clear. Tympanic cavities are clear. No acute osseous abnormality identified. No acute orbit soft tissue findings. Broad-based posterior scalp soft tissue swelling measuring up to 5 mm in thickness, most resembling hematoma (series 3, image 71). Underlying calvarium appears intact. Calcified atherosclerosis at the skull base. Encephalomalacia related to large chronic right MCA territory infarcts. Chronic left greater than right PCA and superior left MCA chronic infarcts small chronic left greater than right cerebellar infarcts. Stable cerebral volume. No ventriculomegaly. No midline shift, mass effect, or evidence of intracranial mass lesion. No acute intracranial hemorrhage identified. No cortically based acute infarct identified. No suspicious intracranial vascular hyperdensity. IMPRESSION: 1. Stable non contrast  CT appearance of the brain. Advanced chronic ischemic disease with widespread encephalomalacia. 2. Broad-based posterior scalp soft tissue thickening most resembling scalp hematoma. No underlying fracture. Electronically Signed   By: Odessa Fleming M.D.   On: 02/09/2016 14:11   Ct Chest Wo Contrast  02/26/2016  CLINICAL DATA:  Short of breath. Rapid breathing. Altered mental status. EXAM: CT CHEST WITHOUT CONTRAST TECHNIQUE: Multidetector CT imaging of the chest was performed following the standard protocol without IV contrast. COMPARISON:  02/11/2016 FINDINGS: Neck base and axilla: Small low-density lesions in the thyroid consistent with sub cm cysts. No neck base or axillary masses or adenopathy. Mediastinum/Lymph Nodes: Heart normal in size. There are dense coronary artery calcifications and changes from  previous cardiac surgery and aortic valve replacement. Great vessels are not normal in caliber. No mediastinal or hilar masses or pathologically enlarged lymph nodes. Lungs/Pleura: No pulmonary mass, infiltrate, or effusion. Upper abdomen: No acute findings. Musculoskeletal: Mild degenerative changes of the visualized spine. No fractures. No osteoblastic or osteolytic lesions. IMPRESSION: 1. No acute findings.  Lungs are clear. Electronically Signed   By: Amie Portland M.D.   On: 02/26/2016 13:35   Dg Chest Port 1 View  02/11/2016  CLINICAL DATA:  Confusion EXAM: PORTABLE CHEST 1 VIEW COMPARISON:  02/08/2016 FINDINGS: Cardiomediastinal silhouette is stable. Status post median sternotomy and aortic valve replacement. Right IJ catheter with tip in right atrium. No infiltrate or pulmonary edema. No pneumothorax. IMPRESSION: No active disease. Right IJ catheter with tip in right atrium. Status post median sternotomy. Electronically Signed   By: Natasha Mead M.D.   On: 02/11/2016 12:57   Dg Chest Port 1 View  02/08/2016  CLINICAL DATA:  Central line placement. EXAM: PORTABLE CHEST 1 VIEW COMPARISON:  Portable film earlier today. FINDINGS: RIGHT IJ catheter tip lies in the proximal RIGHT atrium. No pneumothorax. Unchanged aeration. Prior median sternotomy. IMPRESSION: Satisfactory appearance status post RIGHT IJ catheter placement. Tip lies in the proximal RIGHT atrium. Electronically Signed   By: Elsie Stain M.D.   On: 02/08/2016 13:09   Dg Chest Portable 1 View  02/08/2016  CLINICAL DATA:  55 year old female with a history of shortness of breath and weakness EXAM: PORTABLE CHEST 1 VIEW COMPARISON:  Multiple prior chest x-ray comparison, most recent 01/27/2016 FINDINGS: Cardiomediastinal silhouette unchanged in size and contour. Surgical changes of prior median sternotomy and CABG. There is uplifting of the minor fissure with peritracheal/paramediastinal density superior to the right mainstem. This is more  pronounced than on the comparison plain film. No left-sided confluent airspace disease. No large pleural effusion. No pneumothorax. IMPRESSION: Uplifting of the minor fissure with density along the paramediastinal stripe, concerning for partial volume loss of the right upper lobe. This may reflect mucous plugging in the setting of infection, however, central obstructing mass cannot be excluded. Recommend further evaluation with contrast-enhanced CT. Surgical changes of median sternotomy and CABG. Signed, Yvone Neu. Loreta Ave, DO [facility] Electronically Signed   By: Gilmer Mor D.O.   On: 02/08/2016 12:18     Microbiology: Recent Results (from the past 240 hour(s))  Blood Culture (routine x 2)     Status: None (Preliminary result)   Collection Time: 02/26/16  1:06 PM  Result Value Ref Range Status   Specimen Description BLOOD RIGHT FOREARM  Final   Special Requests IN PEDIATRIC BOTTLE 2CC  Final   Culture   Final    NO GROWTH 3 DAYS Performed at [facility]  Report Status PENDING  Incomplete  MRSA PCR Screening     Status: None   Collection Time: 02/26/16  2:34 PM  Result Value Ref Range Status   MRSA by PCR NEGATIVE NEGATIVE Final    Comment:        The GeneXpert MRSA Assay (FDA approved for NASAL specimens only), is one component of a comprehensive MRSA colonization surveillance program. It is not intended to diagnose MRSA infection nor to guide or monitor treatment for MRSA infections.   Blood Culture (routine x 2)     Status: None (Preliminary result)   Collection Time: 02/26/16  3:35 PM  Result Value Ref Range Status   Specimen Description BLOOD RIGHT ANTECUBITAL  Final   Special Requests IN PEDIATRIC BOTTLE 1CC  Final   Culture   Final    NO GROWTH 3 DAYS Performed at [facility]    Report Status PENDING  Incomplete  Urine culture     Status: None   Collection Time: 02/26/16  4:00 PM  Result  Value Ref Range Status   Specimen Description URINE, CATHETERIZED  Final   Special Requests NONE  Final   Culture   Final    NO GROWTH 2 DAYS Performed at [facility]    Report Status 02/28/2016 FINAL  Final     Labs: Basic Metabolic Panel:  Recent Labs Lab 02/26/16 2229  02/27/16 0600 02/27/16 0833 02/27/16 1554 02/28/16 0321 02/29/16 0458 03/01/16 0455  NA 147*  < > 139  --  136 141 139 145  K 3.3*  < > 3.8  --  3.0* 3.7 4.2 4.8  CL 114*  < > 108  --  103 109 106 112*  CO2 19*  < > 21*  --  23 22 23 25   GLUCOSE 176*  < > 156*  --  129* 139* 156* 97  BUN 16  < > 12  --  7 5* 8 8  CREATININE 1.13*  < > 0.87  --  0.96 0.76 0.54 0.63  CALCIUM 8.4*  < > 7.3*  --  7.4* 7.6* 8.8* 9.3  MG 1.6*  --   --  1.9  --  1.8 2.1 2.0  PHOS <1.0*  --   --   --   --  3.7  --   --   < > = values in this interval not displayed. Liver Function Tests:  Recent Labs Lab 02/26/16 1122  AST 26  ALT 20  ALKPHOS 121  BILITOT 1.4*  PROT 7.5  ALBUMIN 4.0   No results for input(s): LIPASE, AMYLASE in the last 168 hours. No results for input(s): AMMONIA in the last 168 hours. CBC:  Recent Labs Lab 02/26/16 1122 02/27/16 0833 02/28/16 0321 02/29/16 0458 03/01/16 0455  WBC 15.2* 16.9* 10.2 6.5 5.8  NEUTROABS 10.3*  --   --  3.6  --   HGB 11.1* 9.7* 9.1* 10.4* 9.9*  HCT 41.7 29.7* 27.2* 32.1* 30.9*  MCV 96.1 80.9 79.1 80.3 82.0  PLT 500* 302 213 238 190   Cardiac Enzymes:  Recent Labs Lab 02/26/16 2041 02/27/16 0144 02/27/16 0833  TROPONINI 0.07* 0.11* 0.09*   BNP: Invalid input(s): POCBNP CBG:  Recent Labs Lab 02/29/16 1619 02/29/16 2042 02/29/16 2207 03/01/16 0535 03/01/16 0741  GLUCAP 140* 25* 98 82 143*    Time coordinating discharge:  Greater than 30 minutes  Signed:  Adriella Essex, DO [facility] Pager: [phone] 03/01/2016, 12:31 PM

## 2016-03-01 NOTE — Progress Notes (Signed)
ANTICOAGULATION CONSULT NOTE   Pharmacy Consult for Warfarin Indication: mechanical aortic valve replacement  Allergies  Allergen Reactions  . Adhesive [Tape] Other (See Comments)    Can burn skin if left on too long  . Celebrex [Celecoxib] Rash  . Detrol [Tolterodine] Hives    Patient Measurements: Height: 5\' 3"  (160 cm) Weight: 140 lb 12.8 oz (63.866 kg) IBW/kg (Calculated) : 52.4  Vital Signs: Temp: 98.6 F (37 C) (04/13 0529) Temp Source: Oral (04/13 0529) BP: 142/57 mmHg (04/13 0529) Pulse Rate: 64 (04/13 0529)  Labs:  Recent Labs  02/28/16 0321 02/29/16 0458 03/01/16 0455  HGB 9.1* 10.4* 9.9*  HCT 27.2* 32.1* 30.9*  PLT 213 238 190  LABPROT 28.7* 21.1* 28.5*  INR 2.87* 1.83* 2.72*  CREATININE 0.76 0.54 0.63    Estimated Creatinine Clearance: 71.5 mL/min (by C-G formula based on Cr of 0.63).   Medications Scheduled:  . antiseptic oral rinse  7 mL Mouth Rinse q12n4p  . chlorhexidine  15 mL Mouth Rinse BID  . darifenacin  7.5 mg Oral Daily  . insulin aspart  0-20 Units Subcutaneous TID AC & HS  . insulin aspart  5 Units Subcutaneous TID WC  . insulin detemir  20 Units Subcutaneous Daily  . pantoprazole  40 mg Oral Q0600  . pravastatin  20 mg Oral Daily  . senna-docusate  1 tablet Oral BID  . Warfarin - Pharmacist Dosing Inpatient   Does not apply q1800   Assessment: 61 yoF admitted on 4/9 with DKA, r/o sepsis.  PMH includes mechanical aortic valve replacement on chronic warfarin anticoagulation.  Home warfarin dose is reported as 7.5 mg daily, and she was bridging with Lovenox 70mg  BID.  INR on admission was 3.46, upper end of therapeutic range.  Pharmacy has been consulted to continue warfarin dosing inpatient.  Today, 03/01/2016:  INR is therapeutic today, but increased sharply from 1.83 to 2.72 (held warfarin on 4/9 and 4/10 d/t supra-therapeutic INR).  INR value today is more consistent with the one on 4/12 versus the drop on 4/12 (? Accuracy of value  on 4/12)  CBC relatively stable  No bleeding documented  Diet: carb modified, 75% of meal documented  Drug-drug interactions: on abx  Goal of Therapy:  INR 2.5-3.5 (higher INR goal per anticoag clinic notes) Monitor platelets by anticoagulation protocol: Yes   Plan:   Will decrease Warfarin to 5 mg PO x1 today.   Daily INR and CBC.  F/u s/s of bleeding  6/12, PharmD, BCPS 03/01/2016 9:19 AM

## 2016-03-01 NOTE — Progress Notes (Signed)
Pt is active with [facility], referral given to in house rep.

## 2016-03-01 NOTE — Progress Notes (Signed)
Physical Therapy Treatment Patient Details Name: Madison [name] MRN: [id number] DOB: 10/15/1961 Today's Date: 03/08/2016    History of Present Illness Pt adm with DKA 4/9, just in [facility] for same, DC'd 3/28. PMH - Difficult to control DKA, Rt CVA    PT Comments    Pt assisted with ambulating around unit.  Follow Up Recommendations  Home health PT;Supervision for mobility/OOB     Equipment Recommendations  None recommended by PT    Recommendations for Other Services       Precautions / Restrictions Precautions Precautions: Fall    Mobility  Bed Mobility                  Transfers                    Ambulation/Gait                 Stairs            Wheelchair Mobility    Modified Rankin (Stroke Patients Only)       Balance                                    Cognition Arousal/Alertness: Awake/alert Behavior During Therapy: WFL for tasks assessed/performed Overall Cognitive Status: Within Functional Limits for tasks assessed                      Exercises      General Comments        Pertinent Vitals/Pain Pain Assessment: No/denies pain    Home Living                      Prior Function            PT Goals (current goals can now be found in the care plan section) Progress towards PT goals: Progressing toward goals    Frequency  Min 3X/week    PT Plan Current plan remains appropriate    Co-evaluation             End of Session   Activity Tolerance: Patient tolerated treatment well Patient left: with call bell/phone within reach;in chair     Time: 6213-0865 PT Time Calculation (min) (ACUTE ONLY): 20 min  Charges:  $Gait Training: 8-22 mins                    G Codes:      Madison [name],Madison [name] 2016-03-08, 1:09 PM Zenovia Jarred, PT, DPT 03-08-16 Pager: [phone]

## 2016-03-01 NOTE — Consult Note (Signed)
   [facility] CM Inpatient Consult   03/01/2016  Madison [name] September 10, 1961 [id number]    Went to bedside to speak with patient. She is active with [facility] Care Management program. Please see chart review tab then notes for patient outreach [facility] encounters. She reports she is supposed to discharge home today. She is also active with [facility]. Ms. Stair states she is interested in going to diabetic classes. Discussed that she will use [facility] to go to the classes. [name] continues to maintain that she has been taking her insulin as prescribed. States she ate some spoiled spaghetti prior to admission and became very sick from it. She maintains that is the reason why her blood sugar jumped up so high. She also denies having issues with affording medications or with transportation. Patient states her cell phone is back on and her cell number is [phone]. She also states she still has her home number [phone]. Discussed patient with inpatient RNCM. Also made aware that inpatient DM Coordinator will make referral for outpatient DM classes for patient.  Spoke with [facility] RNCM about all of the above and to make aware of patient's discharge for today.   Raiford Noble, MSN-Ed, RN,BSN [facility] Liaison [phone]

## 2016-03-01 NOTE — Progress Notes (Signed)
Hypoglycemic Event  CBG: 25  Treatment: D50 IV 50 mL  Symptoms: Pale and Sweaty  Follow-up CBG: Time:2207 CBG Result:98  Possible Reasons for Event: Unknown  Comments/MD notified:N/A    Madison [name] [employer]

## 2016-03-02 ENCOUNTER — Other Ambulatory Visit: Payer: Self-pay | Admitting: Endocrinology

## 2016-03-02 ENCOUNTER — Other Ambulatory Visit: Payer: Self-pay

## 2016-03-02 DIAGNOSIS — E119 Type 2 diabetes mellitus without complications: Secondary | ICD-10-CM | POA: Diagnosis not present

## 2016-03-02 LAB — CULTURE, BLOOD (ROUTINE X 2)
CULTURE: NO GROWTH
Culture: NO GROWTH

## 2016-03-02 NOTE — Patient Outreach (Addendum)
Successful telephone contact for Transition of Care. Patient was discharged from the acute care setting on 4/13.  Patient identified herself by providing date of birth and address. Patient was agreeable to this assessment and agreed to post acute care discharge home visit on Tuesday, April 18.  Patient and this RNCM collaborated to formulate new case management goals. Patient added she would like to learn more about being healthier to control diabetes.  Patient has agreed to attend classes at the [facility].  Patient states she plans to use [facility] for transportation.  patient will also be receiving HHPT/OT from [facility].

## 2016-03-03 DIAGNOSIS — E119 Type 2 diabetes mellitus without complications: Secondary | ICD-10-CM | POA: Diagnosis not present

## 2016-03-04 DIAGNOSIS — E119 Type 2 diabetes mellitus without complications: Secondary | ICD-10-CM | POA: Diagnosis not present

## 2016-03-05 DIAGNOSIS — I110 Hypertensive heart disease with heart failure: Secondary | ICD-10-CM | POA: Diagnosis not present

## 2016-03-05 DIAGNOSIS — E109 Type 1 diabetes mellitus without complications: Secondary | ICD-10-CM | POA: Diagnosis not present

## 2016-03-05 DIAGNOSIS — R1314 Dysphagia, pharyngoesophageal phase: Secondary | ICD-10-CM | POA: Diagnosis not present

## 2016-03-05 DIAGNOSIS — E119 Type 2 diabetes mellitus without complications: Secondary | ICD-10-CM | POA: Diagnosis not present

## 2016-03-05 DIAGNOSIS — M0600 Rheumatoid arthritis without rheumatoid factor, unspecified site: Secondary | ICD-10-CM | POA: Diagnosis not present

## 2016-03-05 DIAGNOSIS — I5042 Chronic combined systolic (congestive) and diastolic (congestive) heart failure: Secondary | ICD-10-CM | POA: Diagnosis not present

## 2016-03-05 DIAGNOSIS — I350 Nonrheumatic aortic (valve) stenosis: Secondary | ICD-10-CM | POA: Diagnosis not present

## 2016-03-05 DIAGNOSIS — I2510 Atherosclerotic heart disease of native coronary artery without angina pectoris: Secondary | ICD-10-CM | POA: Diagnosis not present

## 2016-03-05 DIAGNOSIS — E785 Hyperlipidemia, unspecified: Secondary | ICD-10-CM | POA: Diagnosis not present

## 2016-03-05 DIAGNOSIS — D649 Anemia, unspecified: Secondary | ICD-10-CM | POA: Diagnosis not present

## 2016-03-06 ENCOUNTER — Ambulatory Visit: Payer: Self-pay

## 2016-03-06 ENCOUNTER — Telehealth: Payer: Self-pay | Admitting: Adult Health

## 2016-03-06 ENCOUNTER — Encounter: Payer: Self-pay | Admitting: Adult Health

## 2016-03-06 ENCOUNTER — Other Ambulatory Visit: Payer: Self-pay

## 2016-03-06 ENCOUNTER — Other Ambulatory Visit: Payer: Self-pay | Admitting: Adult Health

## 2016-03-06 DIAGNOSIS — M0600 Rheumatoid arthritis without rheumatoid factor, unspecified site: Secondary | ICD-10-CM | POA: Diagnosis not present

## 2016-03-06 DIAGNOSIS — E109 Type 1 diabetes mellitus without complications: Secondary | ICD-10-CM | POA: Diagnosis not present

## 2016-03-06 DIAGNOSIS — Z1231 Encounter for screening mammogram for malignant neoplasm of breast: Secondary | ICD-10-CM

## 2016-03-06 DIAGNOSIS — R1314 Dysphagia, pharyngoesophageal phase: Secondary | ICD-10-CM | POA: Diagnosis not present

## 2016-03-06 DIAGNOSIS — I350 Nonrheumatic aortic (valve) stenosis: Secondary | ICD-10-CM | POA: Diagnosis not present

## 2016-03-06 DIAGNOSIS — E785 Hyperlipidemia, unspecified: Secondary | ICD-10-CM | POA: Diagnosis not present

## 2016-03-06 DIAGNOSIS — I110 Hypertensive heart disease with heart failure: Secondary | ICD-10-CM | POA: Diagnosis not present

## 2016-03-06 DIAGNOSIS — I2510 Atherosclerotic heart disease of native coronary artery without angina pectoris: Secondary | ICD-10-CM | POA: Diagnosis not present

## 2016-03-06 DIAGNOSIS — I5042 Chronic combined systolic (congestive) and diastolic (congestive) heart failure: Secondary | ICD-10-CM | POA: Diagnosis not present

## 2016-03-06 DIAGNOSIS — D649 Anemia, unspecified: Secondary | ICD-10-CM | POA: Diagnosis not present

## 2016-03-06 DIAGNOSIS — E119 Type 2 diabetes mellitus without complications: Secondary | ICD-10-CM | POA: Diagnosis not present

## 2016-03-06 NOTE — Telephone Encounter (Signed)
Ok to give verbal order.

## 2016-03-06 NOTE — Telephone Encounter (Signed)
Ok for this service

## 2016-03-06 NOTE — Telephone Encounter (Signed)
Madison [name] from [facility] home care is requesting verbal order for  2 visits per week for 3 weeks with 2 prn visits for diabetic teaching  [phone]

## 2016-03-06 NOTE — Telephone Encounter (Signed)
Verbal order given to [facility]

## 2016-03-06 NOTE — Patient Outreach (Signed)
[facility]) Care Management  03/06/2016  Madison [name] [birth year] [id number]   This RNCM arrived at patient's residence for community care coordination. Patient stated her weight is staying around 140 pounds, she was unable to produce any recordings to support daily weights. Patient states her blood sugars are better controlled.  She reports a fasting blood sugar of 85 this morning.  Patient states her son is still in the custody of her sister. Patient states her sister brings her son by often which has made her very happy. Patient states she intends to move to [place] with her mother, however, she was unable to provide a date. Patient made call to [facility] to schedule transportation for appointment on Thursday.  [facility] RN [name] came during home visit.  Plan: Telephone contact next week for community care coordination

## 2016-03-07 DIAGNOSIS — E119 Type 2 diabetes mellitus without complications: Secondary | ICD-10-CM | POA: Diagnosis not present

## 2016-03-08 ENCOUNTER — Other Ambulatory Visit: Payer: Self-pay | Admitting: Adult Health

## 2016-03-08 ENCOUNTER — Encounter (HOSPITAL_COMMUNITY): Payer: Self-pay | Admitting: *Deleted

## 2016-03-08 ENCOUNTER — Observation Stay (HOSPITAL_COMMUNITY)
Admission: EM | Admit: 2016-03-08 | Discharge: 2016-03-11 | Disposition: A | Discharge status: Home / Self Care | Payer: Commercial Managed Care - HMO | Attending: Internal Medicine | Admitting: Internal Medicine

## 2016-03-08 ENCOUNTER — Encounter: Payer: Self-pay | Admitting: Adult Health

## 2016-03-08 ENCOUNTER — Ambulatory Visit (INDEPENDENT_AMBULATORY_CARE_PROVIDER_SITE_OTHER): Payer: Commercial Managed Care - HMO | Admitting: Adult Health

## 2016-03-08 VITALS — BP 88/54 | HR 112 | Temp 98.2°F | Resp 16 | Wt 135.0 lb

## 2016-03-08 DIAGNOSIS — E785 Hyperlipidemia, unspecified: Secondary | ICD-10-CM | POA: Diagnosis present

## 2016-03-08 DIAGNOSIS — K219 Gastro-esophageal reflux disease without esophagitis: Secondary | ICD-10-CM | POA: Diagnosis not present

## 2016-03-08 DIAGNOSIS — Z6825 Body mass index (BMI) 25.0-25.9, adult: Secondary | ICD-10-CM | POA: Insufficient documentation

## 2016-03-08 DIAGNOSIS — Z951 Presence of aortocoronary bypass graft: Secondary | ICD-10-CM | POA: Diagnosis not present

## 2016-03-08 DIAGNOSIS — N179 Acute kidney failure, unspecified: Secondary | ICD-10-CM | POA: Diagnosis not present

## 2016-03-08 DIAGNOSIS — E119 Type 2 diabetes mellitus without complications: Secondary | ICD-10-CM | POA: Diagnosis not present

## 2016-03-08 DIAGNOSIS — D72829 Elevated white blood cell count, unspecified: Secondary | ICD-10-CM

## 2016-03-08 DIAGNOSIS — I10 Essential (primary) hypertension: Secondary | ICD-10-CM | POA: Diagnosis not present

## 2016-03-08 DIAGNOSIS — I5042 Chronic combined systolic (congestive) and diastolic (congestive) heart failure: Secondary | ICD-10-CM | POA: Diagnosis not present

## 2016-03-08 DIAGNOSIS — F419 Anxiety disorder, unspecified: Secondary | ICD-10-CM | POA: Diagnosis not present

## 2016-03-08 DIAGNOSIS — E860 Dehydration: Secondary | ICD-10-CM | POA: Diagnosis not present

## 2016-03-08 DIAGNOSIS — Z955 Presence of coronary angioplasty implant and graft: Secondary | ICD-10-CM | POA: Diagnosis not present

## 2016-03-08 DIAGNOSIS — R739 Hyperglycemia, unspecified: Secondary | ICD-10-CM | POA: Diagnosis not present

## 2016-03-08 DIAGNOSIS — Z952 Presence of prosthetic heart valve: Secondary | ICD-10-CM

## 2016-03-08 DIAGNOSIS — E669 Obesity, unspecified: Secondary | ICD-10-CM | POA: Insufficient documentation

## 2016-03-08 DIAGNOSIS — Z7901 Long term (current) use of anticoagulants: Secondary | ICD-10-CM | POA: Diagnosis not present

## 2016-03-08 DIAGNOSIS — I252 Old myocardial infarction: Secondary | ICD-10-CM | POA: Diagnosis not present

## 2016-03-08 DIAGNOSIS — R1314 Dysphagia, pharyngoesophageal phase: Secondary | ICD-10-CM | POA: Diagnosis not present

## 2016-03-08 DIAGNOSIS — IMO0002 Reserved for concepts with insufficient information to code with codable children: Secondary | ICD-10-CM | POA: Diagnosis present

## 2016-03-08 DIAGNOSIS — E875 Hyperkalemia: Secondary | ICD-10-CM | POA: Diagnosis present

## 2016-03-08 DIAGNOSIS — R7309 Other abnormal glucose: Secondary | ICD-10-CM | POA: Diagnosis not present

## 2016-03-08 DIAGNOSIS — E108 Type 1 diabetes mellitus with unspecified complications: Secondary | ICD-10-CM | POA: Diagnosis not present

## 2016-03-08 DIAGNOSIS — Z79899 Other long term (current) drug therapy: Secondary | ICD-10-CM | POA: Diagnosis not present

## 2016-03-08 DIAGNOSIS — I959 Hypotension, unspecified: Secondary | ICD-10-CM | POA: Diagnosis not present

## 2016-03-08 DIAGNOSIS — E7800 Pure hypercholesterolemia, unspecified: Secondary | ICD-10-CM | POA: Insufficient documentation

## 2016-03-08 DIAGNOSIS — F329 Major depressive disorder, single episode, unspecified: Secondary | ICD-10-CM | POA: Insufficient documentation

## 2016-03-08 DIAGNOSIS — Z794 Long term (current) use of insulin: Secondary | ICD-10-CM | POA: Insufficient documentation

## 2016-03-08 DIAGNOSIS — E1065 Type 1 diabetes mellitus with hyperglycemia: Secondary | ICD-10-CM | POA: Insufficient documentation

## 2016-03-08 DIAGNOSIS — Z8673 Personal history of transient ischemic attack (TIA), and cerebral infarction without residual deficits: Secondary | ICD-10-CM | POA: Diagnosis not present

## 2016-03-08 DIAGNOSIS — E109 Type 1 diabetes mellitus without complications: Secondary | ICD-10-CM | POA: Diagnosis not present

## 2016-03-08 DIAGNOSIS — R112 Nausea with vomiting, unspecified: Secondary | ICD-10-CM | POA: Diagnosis not present

## 2016-03-08 DIAGNOSIS — I2510 Atherosclerotic heart disease of native coronary artery without angina pectoris: Secondary | ICD-10-CM | POA: Diagnosis not present

## 2016-03-08 DIAGNOSIS — E1011 Type 1 diabetes mellitus with ketoacidosis with coma: Secondary | ICD-10-CM

## 2016-03-08 DIAGNOSIS — M0600 Rheumatoid arthritis without rheumatoid factor, unspecified site: Secondary | ICD-10-CM | POA: Diagnosis not present

## 2016-03-08 DIAGNOSIS — I110 Hypertensive heart disease with heart failure: Secondary | ICD-10-CM | POA: Diagnosis not present

## 2016-03-08 DIAGNOSIS — F32A Depression, unspecified: Secondary | ICD-10-CM

## 2016-03-08 DIAGNOSIS — I350 Nonrheumatic aortic (valve) stenosis: Secondary | ICD-10-CM | POA: Diagnosis not present

## 2016-03-08 DIAGNOSIS — D649 Anemia, unspecified: Secondary | ICD-10-CM | POA: Diagnosis not present

## 2016-03-08 LAB — CBC WITH DIFFERENTIAL/PLATELET
BASOS PCT: 0 %
Basophils Absolute: 0 10*3/uL (ref 0.0–0.1)
EOS ABS: 0 10*3/uL (ref 0.0–0.7)
Eosinophils Relative: 0 %
HCT: 34.5 % — ABNORMAL LOW (ref 36.0–46.0)
Hemoglobin: 11.5 g/dL — ABNORMAL LOW (ref 12.0–15.0)
Lymphocytes Relative: 20 %
Lymphs Abs: 2.5 10*3/uL (ref 0.7–4.0)
MCH: 26.9 pg (ref 26.0–34.0)
MCHC: 33.3 g/dL (ref 30.0–36.0)
MCV: 80.6 fL (ref 78.0–100.0)
MONO ABS: 0.6 10*3/uL (ref 0.1–1.0)
MONOS PCT: 5 %
Neutro Abs: 9 10*3/uL — ABNORMAL HIGH (ref 1.7–7.7)
Neutrophils Relative %: 75 %
Platelets: 283 10*3/uL (ref 150–400)
RBC: 4.28 MIL/uL (ref 3.87–5.11)
RDW: 19.7 % — AB (ref 11.5–15.5)
WBC: 12.2 10*3/uL — ABNORMAL HIGH (ref 4.0–10.5)

## 2016-03-08 LAB — URINALYSIS, ROUTINE W REFLEX MICROSCOPIC
BILIRUBIN URINE: NEGATIVE
Glucose, UA: >1000 mg/dL — AB
Hgb urine dipstick: NEGATIVE
KETONES UR: NEGATIVE mg/dL
LEUKOCYTES UA: NEGATIVE
NITRITE: NEGATIVE
PH: 5 (ref 5.0–8.0)
PROTEIN: NEGATIVE mg/dL
Specific Gravity, Urine: 1.025 (ref 1.005–1.030)

## 2016-03-08 LAB — CBG MONITORING, ED
GLUCOSE-CAPILLARY: 442 mg/dL — AB (ref 65–99)
Glucose-Capillary: 291 mg/dL — ABNORMAL HIGH (ref 65–99)

## 2016-03-08 LAB — PROTIME-INR
INR: 1.81 — ABNORMAL HIGH (ref 0.00–1.49)
Prothrombin Time: 20.3 seconds — ABNORMAL HIGH (ref 11.6–15.2)

## 2016-03-08 LAB — GLUCOSE, CAPILLARY: Glucose-Capillary: 95 mg/dL (ref 65–99)

## 2016-03-08 LAB — BASIC METABOLIC PANEL
Anion gap: 14 (ref 5–15)
BUN: 27 mg/dL — AB (ref 6–20)
CALCIUM: 9.6 mg/dL (ref 8.9–10.3)
CO2: 23 mmol/L (ref 22–32)
CREATININE: 1.2 mg/dL — AB (ref 0.44–1.00)
Chloride: 92 mmol/L — ABNORMAL LOW (ref 101–111)
GFR calc non Af Amer: 50 mL/min — ABNORMAL LOW (ref >60)
GFR, EST AFRICAN AMERICAN: 58 mL/min — AB (ref >60)
Glucose, Bld: 438 mg/dL — ABNORMAL HIGH (ref 65–99)
Potassium: 5.3 mmol/L — ABNORMAL HIGH (ref 3.5–5.1)
SODIUM: 129 mmol/L — AB (ref 135–145)

## 2016-03-08 LAB — URINE MICROSCOPIC-ADD ON

## 2016-03-08 LAB — GLUCOSE, POCT (MANUAL RESULT ENTRY): POC Glucose: 429 mg/dl — AB (ref 70–99)

## 2016-03-08 MED ORDER — ONDANSETRON HCL 4 MG PO TABS
4.0000 mg | ORAL_TABLET | Freq: Four times a day (QID) | ORAL | Status: DC | PRN
  Filled 2016-03-08: qty 1

## 2016-03-08 MED ORDER — SODIUM CHLORIDE 0.9 % IV SOLN
INTRAVENOUS | Status: DC
  Administered 2016-03-08 – 2016-03-09 (×3): via INTRAVENOUS

## 2016-03-08 MED ORDER — INSULIN ASPART 100 UNIT/ML ~~LOC~~ SOLN
0.0000 [IU] | Freq: Three times a day (TID) | SUBCUTANEOUS | Status: DC
  Administered 2016-03-09: 2 [IU] via SUBCUTANEOUS
  Administered 2016-03-09: 3 [IU] via SUBCUTANEOUS
  Administered 2016-03-10: 8 [IU] via SUBCUTANEOUS
  Administered 2016-03-10 – 2016-03-11 (×2): 2 [IU] via SUBCUTANEOUS
  Administered 2016-03-11: 5 [IU] via SUBCUTANEOUS

## 2016-03-08 MED ORDER — SODIUM CHLORIDE 0.9% FLUSH
3.0000 mL | Freq: Two times a day (BID) | INTRAVENOUS | Status: DC
  Administered 2016-03-08 – 2016-03-10 (×2): 3 mL via INTRAVENOUS

## 2016-03-08 MED ORDER — FAMOTIDINE 20 MG PO TABS
40.0000 mg | ORAL_TABLET | Freq: Once | ORAL | Status: AC
  Administered 2016-03-08: 40 mg via ORAL
  Filled 2016-03-08: qty 2

## 2016-03-08 MED ORDER — CITALOPRAM HYDROBROMIDE 40 MG PO TABS
40.0000 mg | ORAL_TABLET | Freq: Every day | ORAL | Status: DC
Start: 2016-03-08 — End: 2018-05-02

## 2016-03-08 MED ORDER — CITALOPRAM HYDROBROMIDE 20 MG PO TABS
40.0000 mg | ORAL_TABLET | Freq: Every day | ORAL | Status: DC
  Administered 2016-03-09 – 2016-03-11 (×3): 40 mg via ORAL
  Filled 2016-03-08 (×3): qty 2

## 2016-03-08 MED ORDER — ONDANSETRON HCL 4 MG/2ML IJ SOLN: 4.0000 mg | Freq: Four times a day (QID) | INTRAMUSCULAR | Status: DC | PRN

## 2016-03-08 MED ORDER — ONDANSETRON HCL 4 MG/2ML IJ SOLN
4.0000 mg | INTRAMUSCULAR | Status: AC
Start: 2016-03-08 — End: 2016-03-08
  Administered 2016-03-08: 4 mg via INTRAMUSCULAR

## 2016-03-08 MED ORDER — KETOROLAC TROMETHAMINE 30 MG/ML IJ SOLN
30.0000 mg | Freq: Once | INTRAMUSCULAR | Status: AC
  Administered 2016-03-08: 30 mg via INTRAVENOUS
  Filled 2016-03-08: qty 1

## 2016-03-08 MED ORDER — SODIUM CHLORIDE 0.9 % IV BOLUS (SEPSIS)
1000.0000 mL | Freq: Once | INTRAVENOUS | Status: AC
  Administered 2016-03-08: 1000 mL via INTRAVENOUS

## 2016-03-08 MED ORDER — PANTOPRAZOLE SODIUM 40 MG IV SOLR
40.0000 mg | Freq: Once | INTRAVENOUS | Status: AC
  Administered 2016-03-08: 40 mg via INTRAVENOUS
  Filled 2016-03-08: qty 40

## 2016-03-08 MED ORDER — WARFARIN SODIUM 5 MG PO TABS
7.5000 mg | ORAL_TABLET | Freq: Once | ORAL | Status: AC
  Administered 2016-03-08: 7.5 mg via ORAL
  Filled 2016-03-08: qty 1

## 2016-03-08 MED ORDER — PANTOPRAZOLE SODIUM 40 MG PO TBEC
40.0000 mg | DELAYED_RELEASE_TABLET | Freq: Every day | ORAL | Status: DC
  Administered 2016-03-09 – 2016-03-11 (×3): 40 mg via ORAL
  Filled 2016-03-08 (×3): qty 1

## 2016-03-08 MED ORDER — HYDROMORPHONE HCL 1 MG/ML IJ SOLN
1.0000 mg | INTRAMUSCULAR | Status: DC | PRN
  Administered 2016-03-09 – 2016-03-10 (×3): 1 mg via INTRAVENOUS
  Filled 2016-03-08 (×3): qty 1

## 2016-03-08 MED ORDER — WARFARIN SODIUM 5 MG PO TABS: 5.0000 mg | ORAL_TABLET | Freq: Every day | ORAL | Status: DC

## 2016-03-08 MED ORDER — WARFARIN - PHARMACIST DOSING INPATIENT: Freq: Every day | Status: DC

## 2016-03-08 MED ORDER — TRAZODONE HCL 50 MG PO TABS
50.0000 mg | ORAL_TABLET | Freq: Every day | ORAL | Status: DC
  Administered 2016-03-08 – 2016-03-10 (×3): 50 mg via ORAL
  Filled 2016-03-08 (×3): qty 1

## 2016-03-08 MED ORDER — INSULIN ASPART 100 UNIT/ML ~~LOC~~ SOLN
10.0000 [IU] | Freq: Once | SUBCUTANEOUS | Status: AC
Start: 2016-03-08 — End: 2016-03-08
  Administered 2016-03-08: 10 [IU] via INTRAVENOUS
  Filled 2016-03-08: qty 1

## 2016-03-08 MED ORDER — INSULIN DETEMIR 100 UNIT/ML ~~LOC~~ SOLN
20.0000 [IU] | Freq: Every day | SUBCUTANEOUS | Status: DC
  Administered 2016-03-09 – 2016-03-11 (×3): 20 [IU] via SUBCUTANEOUS
  Filled 2016-03-08 (×3): qty 0.2

## 2016-03-08 NOTE — Progress Notes (Signed)
ANTICOAGULATION CONSULT NOTE - Initial Consult  Pharmacy Consult for warfarin Indication: s/p aortic valve replacement  Allergies  Allergen Reactions  . Adhesive [Tape] Other (See Comments)    Can burn skin if left on too long  . Celebrex [Celecoxib] Rash  . Detrol [Tolterodine] Hives      Vital Signs: Temp: 98.4 F (36.9 C) (04/20 1544) Temp Source: Oral (04/20 1544) BP: 100/65 mmHg (04/20 1934) Pulse Rate: 73 (04/20 1934)  Labs:  Recent Labs  03/08/16 1641 03/08/16 1643  HGB 11.5*  --   HCT 34.5*  --   PLT 283  --   LABPROT  --  20.3*  INR  --  1.81*  CREATININE 1.20*  --     Estimated Creatinine Clearance: 43.8 mL/min (by C-G formula based on Cr of 1.2).   Medical History: Past Medical History  Diagnosis Date  . CAD (coronary artery disease)   . Obesity   . Hypercholesteremia   . HTN (hypertension)   . Dyslipidemia   . Aortic stenosis   . Thrombophlebitis   . Heart murmur   . Myocardial infarction (HCC) 12/2014  . Pneumonia 11/2014; 12/2014  . GERD (gastroesophageal reflux disease)   . Stroke syndrome [facility]) 1995; 2001    "when my son was born; problems w/speech and L hand since then" (01/19/2015)  . Anxiety   . Depression   . Allergy   . CHF (congestive heart failure) (HCC)   . Rheumatoid arthritis(714.0)     "hands" (07/01/2015)  . Diabetes (HCC) dx'd 1982    "went straight to insulin"      Assessment: 55 y.o. Female comes from [facility] with c/o hyperglycemia and hypotension (CBG 320-420 at PCP office and BP 88/40) for evaluation.  Patient had a fall yesterday, but denies hitting head. Patient denies pain. 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 PT takes 5mg  warfarin daily.  Last dose 4/19, today INR 1.81, pharmacy consulted to dose warfarin.  Goal of Therapy:  INR 2.5-3.5    Plan:  Warfarin 7.5mg  po x 1 tonight Daily INR  5/19 RPh 03/08/2016, 8:09 PM Pager [phone]

## 2016-03-08 NOTE — ED Provider Notes (Signed)
CSN: [id number]     Arrival date & time 03/08/16  1530 History   First MD Initiated Contact with Patient 03/08/16 1540     Chief Complaint  Patient presents with  . Hyperglycemia     (Consider location/radiation/quality/duration/timing/severity/associated sxs/prior Treatment) Patient is a 55 y.o. female presenting with hyperglycemia and fall.  Hyperglycemia Associated symptoms: vomiting (x2 days, 6x today)   Associated symptoms: no abdominal pain, no chest pain and no shortness of breath   Fall This is a new problem. The current episode started 2 days ago. The problem occurs constantly. The problem has not changed since onset.Pertinent negatives include no chest pain, no abdominal pain and no shortness of breath. Nothing aggravates the symptoms. Nothing relieves the symptoms. She has tried nothing for the symptoms. The treatment provided no relief.    Past Medical History  Diagnosis Date  . CAD (coronary artery disease)   . Obesity   . Hypercholesteremia   . HTN (hypertension)   . Dyslipidemia   . Aortic stenosis   . Thrombophlebitis   . Heart murmur   . Myocardial infarction (HCC) 12/2014  . Pneumonia 11/2014; 12/2014  . GERD (gastroesophageal reflux disease)   . Stroke syndrome [facility]) 1995; 2001    "when my son was born; problems w/speech and L hand since then" (01/19/2015)  . Anxiety   . Depression   . Allergy   . CHF (congestive heart failure) (HCC)   . Rheumatoid arthritis(714.0)     "hands" (07/01/2015)  . Diabetes (HCC) dx'd 1982    "went straight to insulin"   Past Surgical History  Procedure Laterality Date  . Cesarean section  1995  . Foot fracture surgery    . Knee arthroscopy Right   . Neuroplasty / transposition median nerve at carpal tunnel    . Left heart catheterization with coronary angiogram N/A 12/08/2014    Procedure: LEFT HEART CATHETERIZATION WITH CORONARY ANGIOGRAM;  Surgeon: Iran Ouch, MD;  Location: [facility] CATH LAB;  Service: Cardiovascular;   Laterality: N/A;  . Percutaneous coronary stent intervention (pci-s) N/A 12/09/2014    Procedure: PERCUTANEOUS CORONARY STENT INTERVENTION (PCI-S);  Surgeon: [name], MD;  Location: [facility] CATH LAB;  Service: Cardiovascular;  Laterality: N/A;  . Cardiac catheterization  12/08/2014  . Coronary angioplasty with stent placement  12/09/2014  . Aortic valve replacement (avr)/coronary artery bypass grafting (cabg)  "2014"    [name] 03/08/2014  . Fracture surgery    . Tubal ligation  1995  . Cataract extraction Left   . Coronary artery bypass graft    . Orif ankle fracture Left 07/04/2015    Procedure: OPEN REDUCTION INTERNAL FIXATION (ORIF)  TRIMAL ANKLE FRACTURE;  Surgeon: [name], MD;  Location: [facility] OR;  Service: Orthopedics;  Laterality: Left;   Family History  Problem Relation Age of Onset  . Stroke    . Heart disease Mother   . Heart disease Sister    Social History  Substance Use Topics  . Smoking status: Never Smoker   . Smokeless tobacco: Never Used  . Alcohol Use: No   OB History    No data available     Review of Systems  Respiratory: Negative for shortness of breath.   Cardiovascular: Negative for chest pain.  Gastrointestinal: Positive for vomiting (x2 days, 6x today). Negative for abdominal pain.  All other systems reviewed and are negative.     Allergies  Adhesive; Celebrex; and Detrol  Home Medications   Prior to Admission  medications   Medication Sig Start Date End Date Taking? Authorizing Provider  acetaminophen (TYLENOL) 325 MG tablet Take 2 tablets (650 mg total) by mouth every 6 (six) hours as needed for mild pain (temp > 101.5). 08/09/15   Leana Roe Elgergawy, MD  citalopram (CELEXA) 40 MG tablet Take 1 tablet (40 mg total) by mouth daily. 03/08/16   [name], NP  insulin aspart (NOVOLOG) 100 UNIT/ML injection Inject 5 Units into the skin 3 (three) times daily with meals. 02/14/16   Zannie Cove, MD  insulin detemir (LEVEMIR) 100 UNIT/ML injection  Inject 0.2 mLs (20 Units total) into the skin daily. 02/14/16   Zannie Cove, MD  lisinopril (PRINIVIL,ZESTRIL) 2.5 MG tablet Take 2.5 mg by mouth daily.  10/22/15   Historical Provider, MD  NOVOLOG FLEXPEN 100 UNIT/ML FlexPen INJECT INTO THE SKIN 3 TIMES DAILY WITH MEALS AS DIRECTED ON PRINTED INSTRUCTIONS 03/05/16   Romero Belling, MD  omeprazole (PRILOSEC) 40 MG capsule TAKE 1 CAPSULE (40 MG TOTAL) BY MOUTH DAILY. 09/12/15   [name], NP  ondansetron (ZOFRAN) 4 MG tablet Take 1 tablet (4 mg total) by mouth every 8 (eight) hours as needed for nausea or vomiting. 02/06/16   [name], NP  pravastatin (PRAVACHOL) 20 MG tablet Take 1 tablet (20 mg total) by mouth daily. 08/17/15   [name], NP  solifenacin (VESICARE) 5 MG tablet Take 1 tablet (5 mg total) by mouth daily. 05/03/15   [name], NP  traZODone (DESYREL) 50 MG tablet Take 50 mg by mouth at bedtime.  12/17/15   Historical Provider, MD  warfarin (COUMADIN) 5 MG tablet Take 1 tablet (5 mg total) by mouth daily. 03/01/16   [name], MD   BP 128/61 mmHg  Pulse 88  Temp(Src) 98.4 F (36.9 C) (Oral)  Resp 20  SpO2 100% Physical Exam  Constitutional: She is oriented to person, place, and time. She appears well-developed and well-nourished. No distress.  HENT:  Head: Normocephalic.  Eyes: Conjunctivae are normal.  Neck: Neck supple. No tracheal deviation present.  Cardiovascular: Normal rate, regular rhythm and normal heart sounds.   Pulmonary/Chest: Effort normal and breath sounds normal. No respiratory distress.  Abdominal: Soft. She exhibits no distension. There is no tenderness. There is no rebound and no guarding.  Neurological: She is alert and oriented to person, place, and time.  Skin: Skin is warm and dry.  Psychiatric: She has a normal mood and affect. Her speech is slurred (which is baseline).  Vitals reviewed.   ED Course  Procedures (including critical care time) Labs Review Labs Reviewed  CBC WITH  DIFFERENTIAL/PLATELET - Abnormal; Notable for the following:    WBC 12.2 (*)    Hemoglobin 11.5 (*)    HCT 34.5 (*)    RDW 19.7 (*)    Neutro Abs 9.0 (*)    All other components within normal limits  BASIC METABOLIC PANEL - Abnormal; Notable for the following:    Sodium 129 (*)    Potassium 5.3 (*)    Chloride 92 (*)    Glucose, Bld 438 (*)    BUN 27 (*)    Creatinine, Ser 1.20 (*)    GFR calc non Af Amer 50 (*)    GFR calc Af Amer 58 (*)    All other components within normal limits  PROTIME-INR - Abnormal; Notable for the following:    Prothrombin Time 20.3 (*)    INR 1.81 (*)    All other components within normal  limits  URINALYSIS, ROUTINE W REFLEX MICROSCOPIC (NOT AT [facility]) - Abnormal; Notable for the following:    APPearance CLOUDY (*)    Glucose, UA >1000 (*)    All other components within normal limits  URINE MICROSCOPIC-ADD ON - Abnormal; Notable for the following:    Squamous Epithelial / LPF 6-30 (*)    Bacteria, UA RARE (*)    All other components within normal limits  CBG MONITORING, ED - Abnormal; Notable for the following:    Glucose-Capillary 442 (*)    All other components within normal limits  CBG MONITORING, ED - Abnormal; Notable for the following:    Glucose-Capillary 291 (*)    All other components within normal limits  URINE CULTURE  GLUCOSE, CAPILLARY  CBC WITH DIFFERENTIAL/PLATELET  COMPREHENSIVE METABOLIC PANEL  PROTIME-INR    Imaging Review No results found. I have personally reviewed and evaluated these images and lab results as part of my medical decision-making.   EKG Interpretation   Date/Time:  Thursday March 08 2016 15:43:57 EDT Ventricular Rate:  89 PR Interval:  166 QRS Duration: 81 QT Interval:  355 QTC Calculation: 432 R Axis:   54 Text Interpretation:  Sinus rhythm Probable left atrial enlargement No  significant change since last tracing Confirmed by Joshuwa Vecchio MD, Reuel Boom  ([id number]) on 03/08/2016 6:10:21 PM      MDM   Final  diagnoses:  Moderate dehydration  AKI (acute kidney injury) (HCC)  Hypotension, unspecified hypotension type    55 y.o. female presents with emesis over the last 2 days. Was seen at PCP office today and found to be very hyperglycemic and hypotensive. Her renal function appears to be worsening, she has some mild to moderate unexplained hypotension and had a recent ICU admission for severe DKA features. Fluid resuscitated and given IV insulin. Not acidemic, no gap currently so does not appear to be in DKA at the moment. Hospitalist was consulted for admission and will see the patient in the emergency department.    Lyndal Pulley, MD 03/09/16 [phone]

## 2016-03-08 NOTE — ED Notes (Signed)
UNABLE TO COLLECT LABS MD IN ROOM TALKING WITH PATIENT

## 2016-03-08 NOTE — Progress Notes (Signed)
Subjective:    Patient ID: Madison [name], female    DOB: Aug 19, 1961, 55 y.o.   MRN: [id number]  HPI  55 year old female with a history of diabetes mellitus type 2, CAD, h/o strokes (residual slurred/slowed speech), h/o AVR on warfarin, coronary artery disease status post CABG, systolic and diastolic CHF, and hypertension who presents to the office today for hospital follow up relating to DKA. She was brought to the ER on 02/26/2016 unresponsive and her serum glucose was found to be 1204. Unfortunately, this was the fourth time since March 2017 that she has been admitted to the hospital for DKA.  She reports that today her glucose was 320 and yesterday it was 400. She states " I am taking all my shots, and I am eating the foods that I should be eating."  DSS came yesterday to talk to her about her son. She is going to give custody of her son to her sister who lives in [place]. She feels as though this is the right choice now, instead of having DSS take her son into a foster home. Even though she knows this is the best option, it is causing her a lot of stress, anxiety, and depression.   Yesterday before DSS arrived at the house, [place]ell[place] was outside, she lost her balance and fell straight backwards, she denies hitting her head but did hit her lower back on a cement side walk. Denies that this was a syncopal episode or that there was any LOC. She has been vomiting and feeling nauseated since the fall. She does not know if the nausea and vomiting is coming from " nerves" or the fall.   She is on 5 mg Coumadin.   She denies any fevers, blurred vision, headaches or feeling acutely ill.    Review of Systems  HENT: Negative.   Eyes: Negative.   Respiratory: Negative.   Cardiovascular: Negative.   Gastrointestinal: Positive for abdominal pain. Negative for nausea, vomiting, diarrhea, blood in stool, abdominal distention and rectal pain.  Musculoskeletal: Positive for back pain, joint swelling and  arthralgias.  Neurological: Negative for dizziness, seizures, syncope, speech difficulty, weakness, light-headedness and headaches.  Hematological: Bruises/bleeds easily.   Past Medical History  Diagnosis Date  . CAD (coronary artery disease)   . Obesity   . Hypercholesteremia   . HTN (hypertension)   . Dyslipidemia   . Aortic stenosis   . Thrombophlebitis   . Heart murmur   . Myocardial infarction (HCC) 12/2014  . Pneumonia 11/2014; 12/2014  . GERD (gastroesophageal reflux disease)   . Stroke syndrome [facility]) 1995; 2001    "when my son was born; problems w/speech and L hand since then" (01/19/2015)  . Anxiety   . Depression   . Allergy   . CHF (congestive heart failure) (HCC)   . Rheumatoid arthritis(714.0)     "hands" (07/01/2015)  . Diabetes (HCC) dx'd 1982    "went straight to insulin"    Social History   Social History  . Marital Status: Divorced    Spouse Name: N/A  . Number of Children: N/A  . Years of Education: N/A   Occupational History  . Not on file.   Social History Main Topics  . Smoking status: Never Smoker   . Smokeless tobacco: Never Used  . Alcohol Use: No  . Drug Use: No  . Sexual Activity: No   Other Topics Concern  . Not on file   Social History Narrative   Divroced  and lives at home.    Has two sons who live in [place] at this time. (21&8 y/o).    Has a cat    Walks a lot.       She is having great success with her diet       Working with exercises that were given to her by PT/OT.     Past Surgical History  Procedure Laterality Date  . Cesarean section  1995  . Foot fracture surgery    . Knee arthroscopy Right   . Neuroplasty / transposition median nerve at carpal tunnel    . Left heart catheterization with coronary angiogram N/A 12/08/2014    Procedure: LEFT HEART CATHETERIZATION WITH CORONARY ANGIOGRAM;  Surgeon: Iran Ouch, MD;  Location: [facility] CATH LAB;  Service: Cardiovascular;  Laterality: N/A;  . Percutaneous coronary stent  intervention (pci-s) N/A 12/09/2014    Procedure: PERCUTANEOUS CORONARY STENT INTERVENTION (PCI-S);  Surgeon: [name], MD;  Location: [facility] CATH LAB;  Service: Cardiovascular;  Laterality: N/A;  . Cardiac catheterization  12/08/2014  . Coronary angioplasty with stent placement  12/09/2014  . Aortic valve replacement (avr)/coronary artery bypass grafting (cabg)  "2014"    [name] 03/08/2014  . Fracture surgery    . Tubal ligation  1995  . Cataract extraction Left   . Coronary artery bypass graft    . Orif ankle fracture Left 07/04/2015    Procedure: OPEN REDUCTION INTERNAL FIXATION (ORIF)  TRIMAL ANKLE FRACTURE;  Surgeon: [name], MD;  Location: [facility] OR;  Service: Orthopedics;  Laterality: Left;    Family History  Problem Relation Age of Onset  . Stroke    . Heart disease Mother   . Heart disease Sister     Allergies  Allergen Reactions  . Adhesive [Tape] Other (See Comments)    Can burn skin if left on too long  . Celebrex [Celecoxib] Rash  . Detrol [Tolterodine] Hives    Current Outpatient Prescriptions on File Prior to Visit  Medication Sig Dispense Refill  . acetaminophen (TYLENOL) 325 MG tablet Take 2 tablets (650 mg total) by mouth every 6 (six) hours as needed for mild pain (temp > 101.5).    Marland Kitchen escitalopram (LEXAPRO) 20 MG tablet Take 1 tablet (20 mg total) by mouth daily. 30 tablet 6  . insulin aspart (NOVOLOG) 100 UNIT/ML injection Inject 5 Units into the skin 3 (three) times daily with meals. 10 mL 2  . insulin detemir (LEVEMIR) 100 UNIT/ML injection Inject 0.2 mLs (20 Units total) into the skin daily. 10 mL 0  . lisinopril (PRINIVIL,ZESTRIL) 2.5 MG tablet Take 2.5 mg by mouth daily.     Marland Kitchen NOVOLOG FLEXPEN 100 UNIT/ML FlexPen INJECT INTO THE SKIN 3 TIMES DAILY WITH MEALS AS DIRECTED ON PRINTED INSTRUCTIONS 15 mL 0  . omeprazole (PRILOSEC) 40 MG capsule TAKE 1 CAPSULE (40 MG TOTAL) BY MOUTH DAILY. 90 capsule 1  . ondansetron (ZOFRAN) 4 MG tablet Take 1 tablet (4 mg  total) by mouth every 8 (eight) hours as needed for nausea or vomiting. 20 tablet 1  . pravastatin (PRAVACHOL) 20 MG tablet Take 1 tablet (20 mg total) by mouth daily. 90 tablet 0  . solifenacin (VESICARE) 5 MG tablet Take 1 tablet (5 mg total) by mouth daily. 90 tablet 2  . traZODone (DESYREL) 50 MG tablet Take 50 mg by mouth at bedtime.   0  . warfarin (COUMADIN) 5 MG tablet Take 1 tablet (5 mg total) by mouth daily.  7 tablet 0   No current facility-administered medications on file prior to visit.    BP 88/54 mmHg  Pulse 112  Temp(Src) 98.2 F (36.8 C) (Oral)  Resp 16  Wt 135 lb (61.236 kg)  SpO2 98%       Objective:   Physical Exam  Constitutional: She is oriented to person, place, and time. She appears well-developed and well-nourished. No distress.  Cardiovascular: Normal rate, regular rhythm and intact distal pulses.  Exam reveals no gallop and no friction rub.   Murmur heard. Pulmonary/Chest: Effort normal and breath sounds normal. No respiratory distress. She has no wheezes. She has no rales. She exhibits no tenderness.  Abdominal: Soft. Bowel sounds are normal. She exhibits no mass. There is tenderness. There is no rebound and no guarding.  Musculoskeletal: Normal range of motion. She exhibits tenderness. She exhibits no edema.  She has pain with palpation to lower back and stomach. There is no bruising noted. No apparent trauma.   Neurological: She is alert and oriented to person, place, and time.  Skin: Skin is warm and dry. No rash noted. She is not diaphoretic. No erythema. No pallor.  Psychiatric: She has a normal mood and affect. Her behavior is normal. Judgment and thought content normal.  Nursing note and vitals reviewed.     Assessment & Plan:  1. Type 1 diabetes mellitus with complication (HCC) - POCT Glucose (CBG)- 429   2. Nausea and vomiting, intractability of vomiting not specified, unspecified vomiting type - extremely nauseated in the office today. Not  actively vomiting but does have instances of dry heaves - ondansetron (ZOFRAN) injection 4 mg; Inject 2 mLs (4 mg total) into the muscle now.  3. Depression - D/c Lexapro  - citalopram (CELEXA) 40 MG tablet; Take 1 tablet (40 mg total) by mouth daily.  Dispense: 90 tablet; Refill: 3  4. Hypotension, unspecified hypotension type - BP low in the office today, last BP 88/54. She usually is in the 130's/140's systolic.  - Not symptomatic.    * Due to hypotension, hyperglycemia ( 4 recent hospital admissions for DKA), fall with back and abdominal pain while on coumadin, and dehydration. I do not feel comfortable sending her home. She needs IV hydration and possibly imaging. I am sending her by EMS to [facility] ER for further evaluation.   [name], NP

## 2016-03-08 NOTE — Progress Notes (Signed)
Pre visit review using our clinic review tool, if applicable. No additional management support is needed unless otherwise documented below in the visit note. 

## 2016-03-08 NOTE — ED Notes (Signed)
Bed: WA03 Expected date:  Expected time:  Means of arrival:  Comments: [id number]

## 2016-03-08 NOTE — ED Notes (Signed)
Bedside report given to Fleet Contras who is transporting patient to [id number].

## 2016-03-08 NOTE — H&P (Signed)
History and Physical    Madison [name] VPX:[id number] DOB: 1961/02/26 DOA: 03/08/2016  Referring MD/NP/PA: Lyndal Pulley, MD PCP: Madison Frees, NP  Outpatient Specialists:  Patient coming from: Home.  Chief Complaint: Hypotension and hyperglycemia.  HPI: Madison [name] is a 55 y.o. female with medical history significant for CAD, arthritis stenosis, S/P AVR, CVA, hypertension, hyperlipidemia, GERD, anxiety, depression, type 1 diabetes who is coming to the emergency department from her PCP office due to hyperglycemia and hypotension.   Per patient, yesterday she had about 6 episodes of emesis, but denies any emesis today. She says that she was still very nauseus today, but has no nausea at the moment and feels hungry after she was given Zofran earlier.She denies fever, chills, chest pain, dyspnea, abdominal pain, diarrhea, constipation, melena or hematochezia. She denies GU symptoms. She states that her granddaughter had a URI recently, but no GI symptoms. She otherwise denies sick contacts or travel history.  The patient also states that she had a fall yesterday after all the episodes of emesis, hitting her sacral and lower back area. She denies LOC, head trauma or any other trauma besides her lower spine area.   ED Course:  Workup in the ER showed hyperglycemia 442 mg/dL and 462 mg/dL without it anion gap or decrease CO2, glucosuria without ketonuria. She also had mild hyperkalemia and mild leukocytosis. She states that the symptoms are better with IV hydration and Zofran. Back pain was better after analgesics were given.  Review of Systems: As per HPI otherwise 10 point review of systems negative.   Past Medical History  Diagnosis Date  . CAD (coronary artery disease)   . Obesity   . Hypercholesteremia   . HTN (hypertension)   . Dyslipidemia   . Aortic stenosis   . Thrombophlebitis   . Heart murmur   . Myocardial infarction (HCC) 12/2014  . Pneumonia 11/2014; 12/2014  . GERD  (gastroesophageal reflux disease)   . Stroke syndrome [facility]) 1995; 2001    "when my son was born; problems w/speech and L hand since then" (01/19/2015)  . Anxiety   . Depression   . Allergy   . CHF (congestive heart failure) (HCC)   . Rheumatoid arthritis(714.0)     "hands" (07/01/2015)  . Diabetes (HCC) dx'd 1982    "went straight to insulin"    Past Surgical History  Procedure Laterality Date  . Cesarean section  1995  . Foot fracture surgery    . Knee arthroscopy Right   . Neuroplasty / transposition median nerve at carpal tunnel    . Left heart catheterization with coronary angiogram N/A 12/08/2014    Procedure: LEFT HEART CATHETERIZATION WITH CORONARY ANGIOGRAM;  Surgeon: Iran Ouch, MD;  Location: [facility] CATH LAB;  Service: Cardiovascular;  Laterality: N/A;  . Percutaneous coronary stent intervention (pci-s) N/A 12/09/2014    Procedure: PERCUTANEOUS CORONARY STENT INTERVENTION (PCI-S);  Surgeon: [name], MD;  Location: [facility] CATH LAB;  Service: Cardiovascular;  Laterality: N/A;  . Cardiac catheterization  12/08/2014  . Coronary angioplasty with stent placement  12/09/2014  . Aortic valve replacement (avr)/coronary artery bypass grafting (cabg)  "2014"    [name] 03/08/2014  . Fracture surgery    . Tubal ligation  1995  . Cataract extraction Left   . Coronary artery bypass graft    . Orif ankle fracture Left 07/04/2015    Procedure: OPEN REDUCTION INTERNAL FIXATION (ORIF)  TRIMAL ANKLE FRACTURE;  Surgeon: [name], MD;  Location:  [facility] OR;  Service: Orthopedics;  Laterality: Left;     reports that she has never smoked. She has never used smokeless tobacco. She reports that she does not drink alcohol or use illicit drugs.  Allergies  Allergen Reactions  . Adhesive [Tape] Other (See Comments)    Can burn skin if left on too long  . Celebrex [Celecoxib] Rash  . Detrol [Tolterodine] Hives    Family History  Problem Relation Age of Onset  . Stroke    . Heart disease  Mother   . Heart disease Sister    Family history was reviewed.  Prior to Admission medications   Medication Sig Start Date End Date Taking? Authorizing Provider  insulin aspart (NOVOLOG) 100 UNIT/ML injection Inject 5 Units into the skin 3 (three) times daily with meals. 02/14/16  Yes Zannie Cove, MD  insulin detemir (LEVEMIR) 100 UNIT/ML injection Inject 0.2 mLs (20 Units total) into the skin daily. 02/14/16  Yes Zannie Cove, MD  lisinopril (PRINIVIL,ZESTRIL) 2.5 MG tablet Take 2.5 mg by mouth daily.  10/22/15  Yes Historical Provider, MD  omeprazole (PRILOSEC) 40 MG capsule TAKE 1 CAPSULE (40 MG TOTAL) BY MOUTH DAILY. 09/12/15  Yes Madison Frees, NP  ondansetron (ZOFRAN) 4 MG tablet Take 1 tablet (4 mg total) by mouth every 8 (eight) hours as needed for nausea or vomiting. 02/06/16  Yes Madison Frees, NP  pravastatin (PRAVACHOL) 20 MG tablet TAKE 1 TABLET BY MOUTH EVERY DAY 03/08/16  Yes Madison Frees, NP  traZODone (DESYREL) 50 MG tablet Take 50 mg by mouth at bedtime.  12/17/15  Yes Historical Provider, MD  warfarin (COUMADIN) 5 MG tablet Take 1 tablet (5 mg total) by mouth daily. 03/01/16  Yes [name], MD  acetaminophen (TYLENOL) 325 MG tablet Take 2 tablets (650 mg total) by mouth every 6 (six) hours as needed for mild pain (temp > 101.5). Patient not taking: Reported on 03/08/2016 08/09/15   Leana Roe Elgergawy, MD  citalopram (CELEXA) 40 MG tablet Take 1 tablet (40 mg total) by mouth daily. 03/08/16   Cory Nafziger, NP  NOVOLOG FLEXPEN 100 UNIT/ML FlexPen INJECT INTO THE SKIN 3 TIMES DAILY WITH MEALS AS DIRECTED ON PRINTED INSTRUCTIONS Patient not taking: Reported on 03/08/2016 03/05/16   Romero Belling, MD  pravastatin (PRAVACHOL) 20 MG tablet Take 1 tablet (20 mg total) by mouth daily. 08/17/15   Madison Frees, NP  solifenacin (VESICARE) 5 MG tablet Take 1 tablet (5 mg total) by mouth daily. Patient not taking: Reported on 03/08/2016 05/03/15   Madison Frees, NP    Physical Exam: Filed  Vitals:   03/08/16 1800 03/08/16 1900 03/08/16 1934 03/08/16 2017  BP: 96/48 108/46 100/65 124/50  Pulse: 71 67 73 66  Temp:    97.9 F (36.6 C)  TempSrc:    Oral  Resp: 25 19 19 18   Height:    5\' 3"  (1.6 m)  Weight:    61 kg (134 lb 7.7 oz)  SpO2: 100% 100% 98% 100%      Constitutional: NAD, calm, comfortable Filed Vitals:   03/08/16 1800 03/08/16 1900 03/08/16 1934 03/08/16 2017  BP: 96/48 108/46 100/65 124/50  Pulse: 71 67 73 66  Temp:    97.9 F (36.6 C)  TempSrc:    Oral  Resp: 25 19 19 18   Height:    5\' 3"  (1.6 m)  Weight:    61 kg (134 lb 7.7 oz)  SpO2: 100% 100% 98% 100%   Eyes: PERRL, lids  and conjunctivae normal ENMT: Mucous membranes are dry. Posterior pharynx clear of any exudate or lesions.Normal dentition.  Neck: normal, supple, no masses, no thyromegaly Respiratory: Clear to auscultation bilaterally, no wheezing, no crackles.                       Normal respiratory effort. No accessory muscle use.  Cardiovascular: Regular rate and rhythm, positive 4/6 systolic murmur / rubs / gallops. No extremity edema. 2+ pedal pulses. No carotid bruits.  Abdomen: no tenderness, no masses palpated. No hepatosplenomegaly. Bowel sounds positive.  Musculoskeletal: no clubbing / cyanosis. No joint deformity upper and lower extremities. Good ROM, no contractures. Normal muscle tone.  Skin: no rashes, lesions, ulcers. No induration Neurologic:Awake, alert, oriented 4, slurred speech, which is patient's baseline.  Psychiatric: Normal judgment and insight. Normal mood.    Labs on Admission: I have personally reviewed following labs and imaging studies  CBC:  Recent Labs Lab 03/08/16 1641  WBC 12.2*  NEUTROABS 9.0*  HGB 11.5*  HCT 34.5*  MCV 80.6  PLT 283   Basic Metabolic Panel:  Recent Labs Lab 03/08/16 1641  NA 129*  K 5.3*  CL 92*  CO2 23  GLUCOSE 438*  BUN 27*  CREATININE 1.20*  CALCIUM 9.6   GFR: Estimated Creatinine Clearance: 43.8 mL/min (by C-G  formula based on Cr of 1.2).  Coagulation Profile:  Recent Labs Lab 03/08/16 1643  INR 1.81*   CBG:  Recent Labs Lab 03/08/16 1547 03/08/16 1812 03/08/16 2159  GLUCAP 442* 291* 95   Urine analysis:    Component Value Date/Time   COLORURINE YELLOW 03/08/2016 1643   APPEARANCEUR CLOUDY* 03/08/2016 1643   LABSPEC 1.025 03/08/2016 1643   PHURINE 5.0 03/08/2016 1643   GLUCOSEU >1000* 03/08/2016 1643   HGBUR NEGATIVE 03/08/2016 1643   BILIRUBINUR NEGATIVE 03/08/2016 1643   BILIRUBINUR n 06/16/2015 1244   KETONESUR NEGATIVE 03/08/2016 1643   PROTEINUR NEGATIVE 03/08/2016 1643   PROTEINUR n 06/16/2015 1244   UROBILINOGEN 0.2 07/30/2015 1415   UROBILINOGEN 0.2 06/16/2015 1244   NITRITE NEGATIVE 03/08/2016 1643   NITRITE n 06/16/2015 1244   LEUKOCYTESUR NEGATIVE 03/08/2016 1643  Echocardiogram 08/01/2015  LV EF: 45% - 50%  ------------------------------------------------------------------- Indications: Fever 780.6.  ------------------------------------------------------------------- History: PMH: Coronary artery disease. Congestive heart failure. Stroke. PMH: Myocardial infarction. Risk factors: Hypertension. Dyslipidemia.  ------------------------------------------------------------------- Study Conclusions  - Left ventricle: The cavity size was normal. Wall thickness was  normal. Systolic function was mildly reduced. The estimated  ejection fraction was in the range of 45% to 50%. Hypokinesis of  the mid-apicalanteroseptal and anterior myocardium. Features are  consistent with a pseudonormal left ventricular filling pattern,  with concomitant abnormal relaxation and increased filling  pressure (grade 2 diastolic dysfunction). - Ventricular septum: Septal motion showed paradox. - Aortic valve: A mechanical prosthesis was present and functioning  normally. Valve area (VTI): 1.47 cm^2. Valve area (Vmax): 1.26  cm^2. Valve area (Vmean):  1.41 cm^2. - Mitral valve: There was mild regurgitation. - Pulmonary arteries: Systolic pressure was mildly increased. PA  peak pressure: 35 mm Hg (S).  Impressions:  - Transprosthetic gradients are similar to March 2016.   EKG: Independently reviewed.  Vent. rate 89 BPM PR interval 166 ms QRS duration 81 ms QT/QTc 355/432 ms P-R-T axes 69 54 42 Sinus rhythm Probable left atrial enlargement No significant change since last tracing  Assessment/Plan Principal Problem:   Hypotension Admit to telemetry/observation. Continue gentle and time-limited IV hydration. Hold antihypertensives  for now. Monitor blood pressure.  Active Problems:   Diabetes type 1, uncontrolled (HCC) Continue IV hydration. Carbohydrate modified diet. Continue long-acting insulin and regular insulin sliding scale coverage.     S/P AVR (aortic valve replacement) Stable. Continue anticoagulation with warfarin per pharmacy.    Hyperlipidemia Continue pravastatin. Monitor LFTs periodically.    Depression Continue Celexa 40 mg by mouth daily.   Continue trazodone 50 mg by mouth daily.    Essential hypertension Resume antihypertensives once the blood pressure increases. Monitor blood pressure.    Hyperkalemia Continue IV hydration. Follow-up potassium level in a.m.    Esophageal reflux Continue proton pump inhibitor.     DVT prophylaxis: On warfarin at home. Code Status: Full code. Family Communication:  Disposition Plan: Admit for gentle IV hydration and blood sugar control. May be discharged in 1 or 2 days. Consults called: Not applied. Admission status: Telemetry/observation.   Bobette Mo MD [facility] Pager [phone].  If 7PM-7AM, please contact night-coverage [email or web address] Password [facility]  03/09/2016, 12:22 AM

## 2016-03-08 NOTE — ED Notes (Addendum)
Per EMS - patient comes from [facility] with c/o hyperglycemia and hypotension (CBG 320-420 at PCP office and BP 88/40) for evaluation.  EMS' BP reading was 110/60, HR 90.  Patient received Zofran IM at PCP office b/c she was c/o nausea.  Patient had a fall yesterday, but denies hitting head.  Patient denies pain.  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

## 2016-03-09 ENCOUNTER — Observation Stay (HOSPITAL_COMMUNITY): Payer: Commercial Managed Care - HMO

## 2016-03-09 DIAGNOSIS — F329 Major depressive disorder, single episode, unspecified: Secondary | ICD-10-CM

## 2016-03-09 DIAGNOSIS — E875 Hyperkalemia: Secondary | ICD-10-CM

## 2016-03-09 DIAGNOSIS — E119 Type 2 diabetes mellitus without complications: Secondary | ICD-10-CM | POA: Diagnosis not present

## 2016-03-09 DIAGNOSIS — D72829 Elevated white blood cell count, unspecified: Secondary | ICD-10-CM | POA: Diagnosis not present

## 2016-03-09 DIAGNOSIS — Z954 Presence of other heart-valve replacement: Secondary | ICD-10-CM

## 2016-03-09 DIAGNOSIS — I959 Hypotension, unspecified: Secondary | ICD-10-CM | POA: Diagnosis not present

## 2016-03-09 DIAGNOSIS — I10 Essential (primary) hypertension: Secondary | ICD-10-CM

## 2016-03-09 DIAGNOSIS — E785 Hyperlipidemia, unspecified: Secondary | ICD-10-CM

## 2016-03-09 DIAGNOSIS — K219 Gastro-esophageal reflux disease without esophagitis: Secondary | ICD-10-CM | POA: Diagnosis not present

## 2016-03-09 DIAGNOSIS — E1011 Type 1 diabetes mellitus with ketoacidosis with coma: Secondary | ICD-10-CM | POA: Diagnosis not present

## 2016-03-09 LAB — COMPREHENSIVE METABOLIC PANEL
ALBUMIN: 2.6 g/dL — AB (ref 3.5–5.0)
ALT: 13 U/L — ABNORMAL LOW (ref 14–54)
AST: 23 U/L (ref 15–41)
Alkaline Phosphatase: 92 U/L (ref 38–126)
Anion gap: 8 (ref 5–15)
BILIRUBIN TOTAL: 0.5 mg/dL (ref 0.3–1.2)
BUN: 17 mg/dL (ref 6–20)
CHLORIDE: 105 mmol/L (ref 101–111)
CO2: 21 mmol/L — ABNORMAL LOW (ref 22–32)
Calcium: 8.2 mg/dL — ABNORMAL LOW (ref 8.9–10.3)
Creatinine, Ser: 0.84 mg/dL (ref 0.44–1.00)
GFR calc Af Amer: >60 mL/min (ref >60)
GFR calc non Af Amer: >60 mL/min (ref >60)
GLUCOSE: 234 mg/dL — AB (ref 65–99)
POTASSIUM: 4.6 mmol/L (ref 3.5–5.1)
Sodium: 134 mmol/L — ABNORMAL LOW (ref 135–145)
Total Protein: 5.2 g/dL — ABNORMAL LOW (ref 6.5–8.1)

## 2016-03-09 LAB — CBC WITH DIFFERENTIAL/PLATELET
BASOS PCT: 0 %
Basophils Absolute: 0 10*3/uL (ref 0.0–0.1)
Eosinophils Absolute: 0.2 10*3/uL (ref 0.0–0.7)
Eosinophils Relative: 3 %
HEMATOCRIT: 29 % — AB (ref 36.0–46.0)
Hemoglobin: 9.5 g/dL — ABNORMAL LOW (ref 12.0–15.0)
Lymphocytes Relative: 28 %
Lymphs Abs: 2.2 10*3/uL (ref 0.7–4.0)
MCH: 27.1 pg (ref 26.0–34.0)
MCHC: 32.8 g/dL (ref 30.0–36.0)
MCV: 82.6 fL (ref 78.0–100.0)
MONO ABS: 0.4 10*3/uL (ref 0.1–1.0)
Monocytes Relative: 5 %
NEUTROS ABS: 5 10*3/uL (ref 1.7–7.7)
Neutrophils Relative %: 64 %
PLATELETS: 241 10*3/uL (ref 150–400)
RBC: 3.51 MIL/uL — AB (ref 3.87–5.11)
RDW: 20.6 % — AB (ref 11.5–15.5)
WBC: 7.8 10*3/uL (ref 4.0–10.5)

## 2016-03-09 LAB — GLUCOSE, CAPILLARY
GLUCOSE-CAPILLARY: 131 mg/dL — AB (ref 65–99)
GLUCOSE-CAPILLARY: 118 mg/dL — AB (ref 65–99)
Glucose-Capillary: 189 mg/dL — ABNORMAL HIGH (ref 65–99)
Glucose-Capillary: 97 mg/dL (ref 65–99)

## 2016-03-09 LAB — MAGNESIUM: MAGNESIUM: 1.7 mg/dL (ref 1.7–2.4)

## 2016-03-09 LAB — PROTIME-INR
INR: 2.59 — ABNORMAL HIGH (ref 0.00–1.49)
Prothrombin Time: 26.6 seconds — ABNORMAL HIGH (ref 11.6–15.2)

## 2016-03-09 MED ORDER — SODIUM CHLORIDE 0.9 % IV SOLN
INTRAVENOUS | Status: DC
  Administered 2016-03-10 – 2016-03-11 (×3): via INTRAVENOUS
  Filled 2016-03-09 (×6): qty 1000

## 2016-03-09 MED ORDER — WARFARIN SODIUM 5 MG PO TABS
5.0000 mg | ORAL_TABLET | Freq: Once | ORAL | Status: AC
  Administered 2016-03-09: 5 mg via ORAL
  Filled 2016-03-09: qty 1

## 2016-03-09 MED ORDER — PRAVASTATIN SODIUM 20 MG PO TABS
20.0000 mg | ORAL_TABLET | Freq: Every day | ORAL | Status: DC
  Administered 2016-03-09 – 2016-03-10 (×2): 20 mg via ORAL
  Filled 2016-03-09 (×2): qty 1

## 2016-03-09 MED ORDER — LISINOPRIL 2.5 MG PO TABS
2.5000 mg | ORAL_TABLET | Freq: Every day | ORAL | Status: DC
  Filled 2016-03-09 (×3): qty 1

## 2016-03-09 MED ORDER — MAGNESIUM SULFATE 2 GM/50ML IV SOLN
2.0000 g | Freq: Once | INTRAVENOUS | Status: AC
  Administered 2016-03-09: 2 g via INTRAVENOUS
  Filled 2016-03-09: qty 50

## 2016-03-09 NOTE — Care Management Note (Signed)
Case Management Note  Patient Details  Name: ROJEAN IGE MRN: [id number] Date of Birth: 04-13-1961  Subjective/Objective:     Admitted from PCP office due to hyperglycemia and hypotension.                Action/Plan: Discharge planning, spoke with patient at bedside. States she has a roommate but she works all day and stays with her at night. She has PCS daily for 3h/day. That person assists with household things, meals, etc. She manages her own medication with a pill box, her sister checks on this to assure accuracy. She uses [facility] for transportation to her appts.   Expected Discharge Date:   (unknown)               Expected Discharge Plan:  Home/Self Care  In-House Referral:  NA  Discharge planning Services  CM Consult  Post Acute Care Choice:  NA Choice offered to:  NA  DME Arranged:  N/A DME Agency:  NA  HH Arranged:  NA HH Agency:  NA  Status of Service:  Completed, signed off  Medicare Important Message Given:    Date Medicare IM Given:    Medicare IM give by:    Date Additional Medicare IM Given:    Additional Medicare Important Message give by:     If discussed at Long Length of Stay Meetings, dates discussed:    Additional Comments:  Alexis Goodell, RN 03/09/2016, 10:11 AM

## 2016-03-09 NOTE — Progress Notes (Signed)
Resumed care of patient. Agree with previous assessment. Orders reviewed. Will continue to monitor. 

## 2016-03-09 NOTE — Care Management Obs Status (Signed)
MEDICARE OBSERVATION STATUS NOTIFICATION   Patient Details  Name: Madison [name] MRN: [id number] Date of Birth: 07-21-1961   Medicare Observation Status Notification Given:  Yes    Alexis Goodell, RN 03/09/2016, 10:10 AM

## 2016-03-09 NOTE — Progress Notes (Signed)
PROGRESS NOTE    Madison [name]  FWY:[id number] DOB: Aug 17, 1961 DOA: 03/08/2016 PCP: [name], NP Outpatient Specialists:    Assessment & Plan:   Principal Problem:   Hypotension Active Problems:   Hyperlipidemia   Depression   Diabetes type 1, uncontrolled (HCC)   Essential hypertension   Hyperkalemia   Esophageal reflux   S/P AVR (aortic valve replacement)  #1 hypotension Likely secondary to hypovolemia in the presence of antihypertensive medications. Antihypertensive medications on hold. Blood pressure improvement with IV fluids. Follow.  #2 poorly controlled diabetes mellitus type 1 CBGs improved. Continue long-acting insulin and sliding scale insulin. IV fluids. Supportive care.  #3 status post aortic valve replacement Coumadin per pharmacy.  #4 hyperlipidemia Continue statin.  #5 depression Celexa. Trazodone.  #6 hypertension BP meds on hold secondary to problem #1.  #7 hyperkalemia Resolved.  #8 gastroesophageal reflux disease PPI.    DVT prophylaxis:  Cod Coumadine Status: Full Family Communication: Updated patient. No family at bedside. Disposition Plan: Home once blood glucose has improved hopefully tomorrow.   Consultants:   None  Procedures:   Chest x-ray 03/09/2016  Antimicrobials:   None   Subjective: Patient denies any nausea. No emesis. No chest pain. No shortness of breath.  Objective: Filed Vitals:   03/09/16 0947 03/09/16 1003 03/09/16 1300 03/09/16 1458  BP: 100/48  92/47 97/46  Pulse:   66   Temp:   97.7 F (36.5 C)   TempSrc:   Oral   Resp:   18   Height:      Weight:  62.9 kg (138 lb 10.7 oz)    SpO2:   99%     Intake/Output Summary (Last 24 hours) at 03/09/16 1500 Last data filed at 03/09/16 1400  Gross per 24 hour  Intake 2763.33 ml  Output      0 ml  Net 2763.33 ml   Filed Weights   03/08/16 2017 03/09/16 1003  Weight: 61 kg (134 lb 7.7 oz) 62.9 kg (138 lb 10.7 oz)    Examination:  General  exam: Appears calm and comfortable  Respiratory system: Clear to auscultation. Respiratory effort normal. Cardiovascular system: S1 & S2 heard, RRR. No JVD, murmurs, rubs, gallops or clicks. No pedal edema. Gastrointestinal system: Abdomen is nondistended, soft and nontender. No organomegaly or masses felt. Normal bowel sounds heard. Central nervous system: Alert and oriented. No focal neurological deficits. Extremities: Symmetric 5 x 5 power. Skin: No rashes, lesions or ulcers Psychiatry: Judgement and insight appear normal. Mood & affect appropriate.     Data Reviewed: I have personally reviewed following labs and imaging studies  CBC:  Recent Labs Lab 03/08/16 1641 03/09/16 0950  WBC 12.2* 7.8  NEUTROABS 9.0* 5.0  HGB 11.5* 9.5*  HCT 34.5* 29.0*  MCV 80.6 82.6  PLT 283 241   Basic Metabolic Panel:  Recent Labs Lab 03/08/16 1641 03/09/16 0950  NA 129* 134*  K 5.3* 4.6  CL 92* 105  CO2 23 21*  GLUCOSE 438* 234*  BUN 27* 17  CREATININE 1.20* 0.84  CALCIUM 9.6 8.2*  MG  --  1.7   GFR: Estimated Creatinine Clearance: 67.6 mL/min (by C-G formula based on Cr of 0.84). Liver Function Tests:  Recent Labs Lab 03/09/16 0950  AST 23  ALT 13*  ALKPHOS 92  BILITOT 0.5  PROT 5.2*  ALBUMIN 2.6*   No results for input(s): LIPASE, AMYLASE in the last 168 hours. No results for input(s): AMMONIA in the last 168 hours.  Coagulation Profile:  Recent Labs Lab 03/08/16 1643 03/09/16 0950  INR 1.81* 2.59*   Cardiac Enzymes: No results for input(s): CKTOTAL, CKMB, CKMBINDEX, TROPONINI in the last 168 hours. BNP (last 3 results) No results for input(s): PROBNP in the last 8760 hours. HbA1C: No results for input(s): HGBA1C in the last 72 hours. CBG:  Recent Labs Lab 03/08/16 1547 03/08/16 1812 03/08/16 2159 03/09/16 0737 03/09/16 1143  GLUCAP 442* 291* 95 131* 189*   Lipid Profile: No results for input(s): CHOL, HDL, LDLCALC, TRIG, CHOLHDL, LDLDIRECT in the  last 72 hours. Thyroid Function Tests: No results for input(s): TSH, T4TOTAL, FREET4, T3FREE, THYROIDAB in the last 72 hours. Anemia Panel: No results for input(s): VITAMINB12, FOLATE, FERRITIN, TIBC, IRON, RETICCTPCT in the last 72 hours. Urine analysis:    Component Value Date/Time   COLORURINE YELLOW 03/08/2016 1643   APPEARANCEUR CLOUDY* 03/08/2016 1643   LABSPEC 1.025 03/08/2016 1643   PHURINE 5.0 03/08/2016 1643   GLUCOSEU >1000* 03/08/2016 1643   HGBUR NEGATIVE 03/08/2016 1643   BILIRUBINUR NEGATIVE 03/08/2016 1643   BILIRUBINUR n 06/16/2015 1244   KETONESUR NEGATIVE 03/08/2016 1643   PROTEINUR NEGATIVE 03/08/2016 1643   PROTEINUR n 06/16/2015 1244   UROBILINOGEN 0.2 07/30/2015 1415   UROBILINOGEN 0.2 06/16/2015 1244   NITRITE NEGATIVE 03/08/2016 1643   NITRITE n 06/16/2015 1244   LEUKOCYTESUR NEGATIVE 03/08/2016 1643   Sepsis Labs: @LABRCNTIP (procalcitonin:4,lacticidven:4)  )No results found for this or any previous visit (from the past 240 hour(s)).       Radiology Studies: Dg Chest 2 View  03/09/2016  CLINICAL DATA:  Leukocytosis. EXAM: CHEST  2 VIEW COMPARISON:  02/11/2016 FINDINGS: The heart size and mediastinal contours are within normal limits. Diffuse bronchial wall thickening is identified bilaterally. Both lungs are clear. The visualized skeletal structures are unremarkable. IMPRESSION: 1. No evidence for pneumonia. 2. Bronchial wall thickening noted. Electronically Signed   By: 02/13/2016 M.D.   On: 03/09/2016 09:50        Scheduled Meds: . citalopram  40 mg Oral Daily  . insulin aspart  0-15 Units Subcutaneous TID WC  . insulin detemir  20 Units Subcutaneous Daily  . lisinopril  2.5 mg Oral Daily  . magnesium sulfate 1 - 4 g bolus IVPB  2 g Intravenous Once  . pantoprazole  40 mg Oral Daily  . pravastatin  20 mg Oral Daily  . sodium chloride flush  3 mL Intravenous Q12H  . traZODone  50 mg Oral QHS  . warfarin  5 mg Oral ONCE-1800  .  Warfarin - Pharmacist Dosing Inpatient   Does not apply q1800   Continuous Infusions: . sodium chloride 100 mL/hr at 03/09/16 0502        Time spent: 35 minutes    THOMPSON,DANIEL, MD [facility] Pager [phone]  If 7PM-7AM, please contact night-coverage [email or web address] Password [email or web address] 03/09/2016, 3:00 PM

## 2016-03-09 NOTE — Progress Notes (Signed)
Inpatient Diabetes Program Recommendations  AACE/ADA: New Consensus Statement on Inpatient Glycemic Control (2015)  Target Ranges:  Prepandial:   less than 140 mg/dL      Peak postprandial:   less than 180 mg/dL (1-2 hours)      Critically ill patients:  140 - 180 mg/dL   Review of Glycemic Control  Diabetes history: DM1 Outpatient Diabetes medications: Levemir 20 units QD, Novolog 5 units tidwc (pt not taking Novolog) Current orders for Inpatient glycemic control: Levemir 20 units QD, Novolog moderate tidwc  Recently d/ced from [facility] on 4/13 with DKA.  Results for Madison [name], Madison [name] (MRN [id number]) as of 03/09/2016 12:34  Ref. Range 03/08/2016 15:47 03/08/2016 18:12 03/08/2016 21:59 03/09/2016 07:37 03/09/2016 11:43  Glucose-Capillary Latest Ref Range: 65-99 mg/dL 767 (H) 341 (H) 95 937 (H) 189 (H)  Results for Madison [name], Madison [name] (MRN [id number]) as of 03/09/2016 12:34  Ref. Range 01/28/2016 03:39  Hemoglobin A1C Latest Ref Range: 4.8-5.6 % 9.9 (H)  Will likely need meal coverage insulin since eating 100%.  Inpatient Diabetes Program Recommendations:    Add Novolog 4 units tidwc for meal coverage insulin.  Per Progress Note dated 02/28/2016:  -Note patient just discharged from hospital after admission for DKA (03/22 through 03/28). Was previously taking 70/30 insulin bidwc but was switched to Levemir and Novolog on 02/14/15.   -Patient has been followed by [facility] services as an outpatient since that admission. Has been seeing Dr. Romero Belling with [facility] as an outpatient as well. Per [facility] notes from 02/21/16, patient is seeking care under new Endocrinologist. Waiting on referral from her PCP to see new ENDO.  -Spoke with patient this morning about her DM care regimen at home. Patient confirmed with me that she was started on a new regimen on 02/14/16 at time of d/c from the hospital (Levemir 20 units daily + Novolog 5 units tidwc). Did not have any questions for me about her new  insulin regimen. Patient also confirmed with me that [facility] is making home visits with her once weekly and that she is trying to have her PCP get her a new Endocrinologist here in [place]. Patient went on to tell me that she thinks she got sick b/c she ate some old food from her refrigerator and immediately had vomiting after she ate the food. Stated she took her insulin despite the vomiting b/c her CBGs were high and eventually wound up in the ED with DKA.  Will continue to follow. Thank you. Ailene Ards, RD, LDN, CDE Inpatient Diabetes Coordinator [phone]

## 2016-03-09 NOTE — Progress Notes (Signed)
ANTICOAGULATION CONSULT NOTE   Pharmacy Consult for warfarin Indication: s/p aortic valve replacement  Allergies  Allergen Reactions  . Adhesive [Tape] Other (See Comments)    Can burn skin if left on too long  . Celebrex [Celecoxib] Rash  . Detrol [Tolterodine] Hives      Vital Signs: Temp: 98 F (36.7 C) (04/21 0657) Temp Source: Oral (04/21 0657) BP: 100/48 mmHg (04/21 0947) Pulse Rate: 60 (04/21 0657)  Labs:  Recent Labs  03/08/16 1641 03/08/16 1643 03/09/16 0950  HGB 11.5*  --  9.5*  HCT 34.5*  --  29.0*  PLT 283  --  241  LABPROT  --  20.3* 26.6*  INR  --  1.81* 2.59*  CREATININE 1.20*  --  0.84    Estimated Creatinine Clearance: 67.6 mL/min (by C-G formula based on Cr of 0.84).   Medical History: Past Medical History  Diagnosis Date  . CAD (coronary artery disease)   . Obesity   . Hypercholesteremia   . HTN (hypertension)   . Dyslipidemia   . Aortic stenosis   . Thrombophlebitis   . Heart murmur   . Myocardial infarction (HCC) 12/2014  . Pneumonia 11/2014; 12/2014  . GERD (gastroesophageal reflux disease)   . Stroke syndrome [facility]) 1995; 2001    "when my son was born; problems w/speech and L hand since then" (01/19/2015)  . Anxiety   . Depression   . Allergy   . CHF (congestive heart failure) (HCC)   . Rheumatoid arthritis(714.0)     "hands" (07/01/2015)  . Diabetes (HCC) dx'd 1982    "went straight to insulin"      Assessment: 55 y.o. Female comes from [facility] with c/o hyperglycemia and hypotension (CBG 320-420 at PCP office and BP 88/40) for evaluation.  Patient had a fall yesterday, but denies hitting head. Patient denies pain. 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 pharmacy consulted to dose warfarin for mechanical AVR.    Patient takes warfarin 5mg  daily (LD 4/19), dose was reduced at recent discharge 4/13.  She was previously on 7.5mg  PO daily.   03/09/2016  INR therapeutic with boosted warfarin  dose last night  CBC: Hgb down overnight (? Dilutional), pltc WNL  Carb mod diet  No major drug-drug interactions  Goal of Therapy:  INR 2.5-3.5    Plan:   Warfarin 5mg  PO x 1 tonight based on INR rate of rise  Daily INR  May need to increase home dose based on subtherapeutic INR at admission    03/11/2016, PharmD, BCPS.   Pager: 03/09/2016 12:04 PM

## 2016-03-10 DIAGNOSIS — I959 Hypotension, unspecified: Secondary | ICD-10-CM | POA: Diagnosis not present

## 2016-03-10 DIAGNOSIS — F329 Major depressive disorder, single episode, unspecified: Secondary | ICD-10-CM | POA: Diagnosis not present

## 2016-03-10 DIAGNOSIS — E1011 Type 1 diabetes mellitus with ketoacidosis with coma: Secondary | ICD-10-CM

## 2016-03-10 DIAGNOSIS — K219 Gastro-esophageal reflux disease without esophagitis: Secondary | ICD-10-CM | POA: Diagnosis not present

## 2016-03-10 DIAGNOSIS — E875 Hyperkalemia: Secondary | ICD-10-CM | POA: Diagnosis not present

## 2016-03-10 DIAGNOSIS — I10 Essential (primary) hypertension: Secondary | ICD-10-CM | POA: Diagnosis not present

## 2016-03-10 LAB — BASIC METABOLIC PANEL
Anion gap: 6 (ref 5–15)
BUN: 9 mg/dL (ref 6–20)
CALCIUM: 8.4 mg/dL — AB (ref 8.9–10.3)
CO2: 24 mmol/L (ref 22–32)
CREATININE: 0.76 mg/dL (ref 0.44–1.00)
Chloride: 110 mmol/L (ref 101–111)
GFR calc Af Amer: >60 mL/min (ref >60)
GLUCOSE: 83 mg/dL (ref 65–99)
Potassium: 4.5 mmol/L (ref 3.5–5.1)
SODIUM: 140 mmol/L (ref 135–145)

## 2016-03-10 LAB — GLUCOSE, CAPILLARY
Glucose-Capillary: 114 mg/dL — ABNORMAL HIGH (ref 65–99)
Glucose-Capillary: 281 mg/dL — ABNORMAL HIGH (ref 65–99)
Glucose-Capillary: 143 mg/dL — ABNORMAL HIGH (ref 65–99)
Glucose-Capillary: 107 mg/dL — ABNORMAL HIGH (ref 65–99)

## 2016-03-10 LAB — MAGNESIUM: MAGNESIUM: 2.1 mg/dL (ref 1.7–2.4)

## 2016-03-10 LAB — URINE CULTURE

## 2016-03-10 LAB — PROTIME-INR
INR: 3.69 — AB (ref 0.00–1.49)
Prothrombin Time: 34.7 seconds — ABNORMAL HIGH (ref 11.6–15.2)

## 2016-03-10 MED ORDER — POLYETHYLENE GLYCOL 3350 17 G PO PACK
17.0000 g | PACK | Freq: Two times a day (BID) | ORAL | Status: DC
  Administered 2016-03-10 – 2016-03-11 (×2): 17 g via ORAL
  Filled 2016-03-10 (×2): qty 1

## 2016-03-10 MED ORDER — SORBITOL 70 % SOLN: 30.0000 mL | Freq: Every day | Status: DC | PRN

## 2016-03-10 NOTE — Progress Notes (Signed)
PROGRESS NOTE    Madison [name]  KZL:[id number] DOB: 1961/10/24 DOA: 03/08/2016 PCP: [name], NP Outpatient Specialists:    Assessment & Plan:   Principal Problem:   Hypotension Active Problems:   Hyperlipidemia   Depression   Diabetes type 1, uncontrolled (HCC)   Essential hypertension   Hyperkalemia   Esophageal reflux   S/P AVR (aortic valve replacement)  #1 hypotension Likely secondary to hypovolemia in the presence of antihypertensive medications. Antihypertensive medications on hold. Blood pressure improvement with IV fluids. Follow.  #2 poorly controlled diabetes mellitus type 1 CBGs improved. Continue long-acting insulin and sliding scale insulin. IV fluids. Supportive care.  #3 status post aortic valve replacement INR at 3.69 from 2.59. Coumadin on hold today. Coumadin per pharmacy.  #4 hyperlipidemia Continue statin.  #5 depression Celexa. Trazodone.  #6 hypertension BP meds on hold secondary to problem #1.  #7 hyperkalemia Resolved.  #8 gastroesophageal reflux disease PPI.    DVT prophylaxis:  Cod Coumadine Status: Full Family Communication: Updated patient. No family at bedside. Disposition Plan: Home once blood glucose has improved and INR stable, hopefully tomorrow.   Consultants:   None  Procedures:   Chest x-ray 03/09/2016  Antimicrobials:   None   Subjective: Patient denies any nausea. No emesis. No chest pain. No shortness of breath. Patient tolerating current diet. No bleeding. Patient complaining of constipation.  Objective: Filed Vitals:   03/09/16 1300 03/09/16 1458 03/09/16 2203 03/10/16 0504  BP: 92/47 97/46 94/41  119/61  Pulse: 66  67 69  Temp: 97.7 F (36.5 C)  97.5 F (36.4 C) 98.1 F (36.7 C)  TempSrc: Oral  Oral Oral  Resp: 18  18 18   Height:      Weight:    64.411 kg (142 lb)  SpO2: 99%  100% 98%    Intake/Output Summary (Last 24 hours) at 03/10/16 1207 Last data filed at 03/10/16 0900  Gross per  24 hour  Intake 1764.16 ml  Output      0 ml  Net 1764.16 ml   Filed Weights   03/08/16 2017 03/09/16 1003 03/10/16 0504  Weight: 61 kg (134 lb 7.7 oz) 62.9 kg (138 lb 10.7 oz) 64.411 kg (142 lb)    Examination:  General exam: Appears calm and comfortable  Respiratory system: Clear to auscultation. Respiratory effort normal. Cardiovascular system: S1 & S2 heard, RRR. No JVD, murmurs, rubs, gallops or clicks. No pedal edema. Gastrointestinal system: Abdomen is nondistended, soft and nontender. No organomegaly or masses felt. Normal bowel sounds heard. Central nervous system: Alert and oriented. No focal neurological deficits. Extremities: Symmetric 5 x 5 power. Skin: No rashes, lesions or ulcers Psychiatry: Judgement and insight appear normal. Mood & affect appropriate.     Data Reviewed: I have personally reviewed following labs and imaging studies  CBC:  Recent Labs Lab 03/08/16 1641 03/09/16 0950  WBC 12.2* 7.8  NEUTROABS 9.0* 5.0  HGB 11.5* 9.5*  HCT 34.5* 29.0*  MCV 80.6 82.6  PLT 283 241   Basic Metabolic Panel:  Recent Labs Lab 03/08/16 1641 03/09/16 0950 03/10/16 0451  NA 129* 134* 140  K 5.3* 4.6 4.5  CL 92* 105 110  CO2 23 21* 24  GLUCOSE 438* 234* 83  BUN 27* 17 9  CREATININE 1.20* 0.84 0.76  CALCIUM 9.6 8.2* 8.4*  MG  --  1.7 2.1   GFR: Estimated Creatinine Clearance: 71.7 mL/min (by C-G formula based on Cr of 0.76). Liver Function Tests:  Recent Labs Lab  03/09/16 0950  AST 23  ALT 13*  ALKPHOS 92  BILITOT 0.5  PROT 5.2*  ALBUMIN 2.6*   No results for input(s): LIPASE, AMYLASE in the last 168 hours. No results for input(s): AMMONIA in the last 168 hours. Coagulation Profile:  Recent Labs Lab 03/08/16 1643 03/09/16 0950 03/10/16 0451  INR 1.81* 2.59* 3.69*   Cardiac Enzymes: No results for input(s): CKTOTAL, CKMB, CKMBINDEX, TROPONINI in the last 168 hours. BNP (last 3 results) No results for input(s): PROBNP in the last  8760 hours. HbA1C: No results for input(s): HGBA1C in the last 72 hours. CBG:  Recent Labs Lab 03/09/16 0737 03/09/16 1143 03/09/16 1658 03/09/16 2208 03/10/16 0801  GLUCAP 131* 189* 118* 97 114*   Lipid Profile: No results for input(s): CHOL, HDL, LDLCALC, TRIG, CHOLHDL, LDLDIRECT in the last 72 hours. Thyroid Function Tests: No results for input(s): TSH, T4TOTAL, FREET4, T3FREE, THYROIDAB in the last 72 hours. Anemia Panel: No results for input(s): VITAMINB12, FOLATE, FERRITIN, TIBC, IRON, RETICCTPCT in the last 72 hours. Urine analysis:    Component Value Date/Time   COLORURINE YELLOW 03/08/2016 1643   APPEARANCEUR CLOUDY* 03/08/2016 1643   LABSPEC 1.025 03/08/2016 1643   PHURINE 5.0 03/08/2016 1643   GLUCOSEU >1000* 03/08/2016 1643   HGBUR NEGATIVE 03/08/2016 1643   BILIRUBINUR NEGATIVE 03/08/2016 1643   BILIRUBINUR n 06/16/2015 1244   KETONESUR NEGATIVE 03/08/2016 1643   PROTEINUR NEGATIVE 03/08/2016 1643   PROTEINUR n 06/16/2015 1244   UROBILINOGEN 0.2 07/30/2015 1415   UROBILINOGEN 0.2 06/16/2015 1244   NITRITE NEGATIVE 03/08/2016 1643   NITRITE n 06/16/2015 1244   LEUKOCYTESUR NEGATIVE 03/08/2016 1643   Sepsis Labs: @LABRCNTIP (procalcitonin:4,lacticidven:4)  ) Recent Results (from the past 240 hour(s))  Urine culture     Status: None   Collection Time: 03/08/16  4:43 PM  Result Value Ref Range Status   Specimen Description URINE, CLEAN CATCH  Final   Special Requests NONE  Final   Culture MULTIPLE SPECIES PRESENT, SUGGEST RECOLLECTION  Final   Report Status 03/10/2016 FINAL  Final         Radiology Studies: Dg Chest 2 View  03/09/2016  CLINICAL DATA:  Leukocytosis. EXAM: CHEST  2 VIEW COMPARISON:  02/11/2016 FINDINGS: The heart size and mediastinal contours are within normal limits. Diffuse bronchial wall thickening is identified bilaterally. Both lungs are clear. The visualized skeletal structures are unremarkable. IMPRESSION: 1. No evidence for  pneumonia. 2. Bronchial wall thickening noted. Electronically Signed   By: 02/13/2016 M.D.   On: 03/09/2016 09:50        Scheduled Meds: . citalopram  40 mg Oral Daily  . insulin aspart  0-15 Units Subcutaneous TID WC  . insulin detemir  20 Units Subcutaneous Daily  . lisinopril  2.5 mg Oral Daily  . pantoprazole  40 mg Oral Daily  . polyethylene glycol  17 g Oral BID  . pravastatin  20 mg Oral Daily  . sodium chloride flush  3 mL Intravenous Q12H  . traZODone  50 mg Oral QHS  . Warfarin - Pharmacist Dosing Inpatient   Does not apply q1800   Continuous Infusions: . sodium chloride 0.9 % 1,000 mL infusion 125 mL/hr at 03/10/16 0207        Time spent: 35 minutes    THOMPSON,DANIEL, MD [facility] Pager [phone]  If 7PM-7AM, please contact night-coverage [email or web address] Password [facility] 03/10/2016, 12:07 PM

## 2016-03-10 NOTE — Progress Notes (Signed)
ANTICOAGULATION CONSULT NOTE   Pharmacy Consult for warfarin Indication: s/p aortic valve replacement  Allergies  Allergen Reactions  . Adhesive [Tape] Other (See Comments)    Can burn skin if left on too long  . Celebrex [Celecoxib] Rash  . Detrol [Tolterodine] Hives      Vital Signs: Temp: 98.1 F (36.7 C) (04/22 0504) Temp Source: Oral (04/22 0504) BP: 119/61 mmHg (04/22 0504) Pulse Rate: 69 (04/22 0504)  Labs:  Recent Labs  03/08/16 1641 03/08/16 1643 03/09/16 0950 03/10/16 0451  HGB 11.5*  --  9.5*  --   HCT 34.5*  --  29.0*  --   PLT 283  --  241  --   LABPROT  --  20.3* 26.6* 34.7*  INR  --  1.81* 2.59* 3.69*  CREATININE 1.20*  --  0.84 0.76    Estimated Creatinine Clearance: 71.7 mL/min (by C-G formula based on Cr of 0.76).   Medical History: Past Medical History  Diagnosis Date  . CAD (coronary artery disease)   . Obesity   . Hypercholesteremia   . HTN (hypertension)   . Dyslipidemia   . Aortic stenosis   . Thrombophlebitis   . Heart murmur   . Myocardial infarction (HCC) 12/2014  . Pneumonia 11/2014; 12/2014  . GERD (gastroesophageal reflux disease)   . Stroke syndrome [facility]) 1995; 2001    "when my son was born; problems w/speech and L hand since then" (01/19/2015)  . Anxiety   . Depression   . Allergy   . CHF (congestive heart failure) (HCC)   . Rheumatoid arthritis(714.0)     "hands" (07/01/2015)  . Diabetes (HCC) dx'd 1982    "went straight to insulin"      Assessment: 55 y.o. Female comes from [facility] with c/o hyperglycemia and hypotension (CBG 320-420 at PCP office and BP 88/40) for evaluation.  Patient had a fall yesterday, but denies hitting head. Patient denies pain. 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 pharmacy consulted to dose warfarin for mechanical AVR.    Patient takes warfarin 5mg  daily (LD 4/19), dose was reduced at recent discharge 4/13.  She was previously on 7.5mg  PO daily.    03/10/2016  INR  supratherapeutic with boosted warfarin dose last 4/20PM  CBC: Hgb down overnight (? Dilutional), pltc WNL  Carb mod diet  No major drug-drug interactions  Goal of Therapy:  INR 2.5-3.5    Plan:   No warfarin tonight due to INR rate of rise and being above goal upper enf of goal (=3.5)  If discharged, resume prior to admission dosing of warfarin 5mg  daily on Sunday 4/23  Daily INR while in hosptal  Tuesday, PharmD, BCPS.   Pager: 5/23 03/10/2016 11:33 AM

## 2016-03-11 DIAGNOSIS — E875 Hyperkalemia: Secondary | ICD-10-CM | POA: Diagnosis not present

## 2016-03-11 DIAGNOSIS — E109 Type 1 diabetes mellitus without complications: Secondary | ICD-10-CM

## 2016-03-11 DIAGNOSIS — I10 Essential (primary) hypertension: Secondary | ICD-10-CM | POA: Diagnosis not present

## 2016-03-11 DIAGNOSIS — E1011 Type 1 diabetes mellitus with ketoacidosis with coma: Secondary | ICD-10-CM | POA: Diagnosis not present

## 2016-03-11 DIAGNOSIS — I959 Hypotension, unspecified: Secondary | ICD-10-CM | POA: Diagnosis not present

## 2016-03-11 DIAGNOSIS — F329 Major depressive disorder, single episode, unspecified: Secondary | ICD-10-CM | POA: Diagnosis not present

## 2016-03-11 DIAGNOSIS — K219 Gastro-esophageal reflux disease without esophagitis: Secondary | ICD-10-CM | POA: Diagnosis not present

## 2016-03-11 LAB — BASIC METABOLIC PANEL
ANION GAP: 8 (ref 5–15)
BUN: 8 mg/dL (ref 6–20)
CHLORIDE: 109 mmol/L (ref 101–111)
CO2: 23 mmol/L (ref 22–32)
Calcium: 8.4 mg/dL — ABNORMAL LOW (ref 8.9–10.3)
Creatinine, Ser: 0.54 mg/dL (ref 0.44–1.00)
GFR calc non Af Amer: >60 mL/min (ref >60)
Glucose, Bld: 120 mg/dL — ABNORMAL HIGH (ref 65–99)
POTASSIUM: 4.4 mmol/L (ref 3.5–5.1)
Sodium: 140 mmol/L (ref 135–145)

## 2016-03-11 LAB — GLUCOSE, CAPILLARY
GLUCOSE-CAPILLARY: 151 mg/dL — AB (ref 65–99)
Glucose-Capillary: 71 mg/dL (ref 65–99)
Glucose-Capillary: 231 mg/dL — ABNORMAL HIGH (ref 65–99)

## 2016-03-11 LAB — PROTIME-INR
INR: 3.95 — ABNORMAL HIGH (ref 0.00–1.49)
Prothrombin Time: 36.5 seconds — ABNORMAL HIGH (ref 11.6–15.2)

## 2016-03-11 MED ORDER — POLYETHYLENE GLYCOL 3350 17 G PO PACK: 17.0000 g | PACK | Freq: Two times a day (BID) | ORAL | Status: DC

## 2016-03-11 MED ORDER — WARFARIN SODIUM 5 MG PO TABS: 5.0000 mg | ORAL_TABLET | Freq: Every day | ORAL | Status: DC

## 2016-03-11 NOTE — Progress Notes (Signed)
Patient given discharge instructions and medication list.  Patient verbalized understanding.  Patient also given bus pass for ride home.

## 2016-03-11 NOTE — Discharge Summary (Signed)
Physician Discharge Summary  Madison [name] TDS:[id number] DOB: 1961/02/15 DOA: 03/08/2016  PCP: Madison Frees, NP  Admit date: 03/08/2016 Discharge date: 03/11/2016  Time spent: 65 minutes  Recommendations for Outpatient Follow-up:  1. Follow-up Madison Frees, NP office on Tuesday, 03/13/2016 for Coumadin/INR check and further recommendations on resumption and doses of Coumadin. 2. Follow-up with Madison Frees, NP in 1 week. On follow-up patient in need a basic metabolic profile done to follow-up on electrolytes and renal function. Patient's blood pressure need to be reassessed as her antihypertensive medications were discontinued during the hospitalization due to hypotension. Patient's diabetes will also need to be reassessed.   Discharge Diagnoses:  Principal Problem:   Hypotension Active Problems:   Hyperlipidemia   Depression   Diabetes type 1, uncontrolled (HCC)   Essential hypertension   Hyperkalemia   Esophageal reflux   S/P AVR (aortic valve replacement)   Type 1 diabetes, HbA1c goal < 7% (HCC)   Discharge Condition: Stable and improved  Diet recommendation: Carb modified diet.  Filed Weights   03/09/16 1003 03/10/16 0504 03/11/16 0500  Weight: 62.9 kg (138 lb 10.7 oz) 64.411 kg (142 lb) 64.229 kg (141 lb 9.6 oz)    History of present illness:  Per Dr Madison [name] is a 55 y.o. female with medical history significant for CAD, arthritis stenosis, S/P AVR, CVA, hypertension, hyperlipidemia, GERD, anxiety, depression, type 1 diabetes who came to the emergency department from her PCP office due to hyperglycemia and hypotension.   Per patient, 1 day prior to admission, she had about 6 episodes of emesis, but denied any emesis on the day of admission. She stated that she was still very nauseus on the day of admission, but has no nausea at the moment and feels hungry after she was given Zofran earlier.She denied fever, chills, chest pain, dyspnea, abdominal pain,  diarrhea, constipation, melena or hematochezia. She denies GU symptoms. She stated that her granddaughter had a URI recently, but no GI symptoms. She otherwise denied sick contacts or travel history.  The patient also stated that she had a fall 1 day prior to admission, after all the episodes of emesis, hitting her sacral and lower back area. She denied LOC, head trauma or any other trauma besides her lower spine area.   ED Course:  Workup in the ER showed hyperglycemia 442 mg/dL and 262 mg/dL without anion gap or decrease CO2, glucosuria without ketonuria. She also had mild hyperkalemia and mild leukocytosis. She stated that the symptoms were better with IV hydration and Zofran. Back pain was better after analgesics were given.  Review of Systems: As per HPI otherwise 10 point review of systems negative.   Hospital Course:  #1 hypotension Likely secondary to hypovolemia in the presence of antihypertensive medications. Antihypertensive medications were held throughout the hospitalization and patient hydrated with IV fluids. Patient's hypotension responded to IV fluids and had resolved by day of discharge. Patient be discharged home off her antihypertensive medications and is to follow-up with PCP as outpatient.  #2 poorly controlled diabetes mellitus type 1 Patient was admitted with hypoglycemia and placed on IV fluids, continued on her home regimen of long-acting insulin as well as sliding scale insulin. Patient had no signs or symptoms of infection urinalysis which was done was nitrite negative leukocytes negative. Chest x-ray done was negative for any acute infiltrate. Patient remained afebrile. Patient's leukocytosis resolved. Patient's CBGs improved and patient was discharged home in stable and improved condition with close follow-up with PCP  as outpatient.  #3 status post aortic valve replacement INR at 3.69 from 2.59. Coumadin was held. Patient's INR remains supratherapeutic. Patient is to  follow-up at the Coumadin clinic on to say 03/13/2016 for PT/INR check and further recommendations on resumption of Coumadin.   #4 hyperlipidemia Continued on statin.  #5 depression Patient was continued on home regimen of Celexa and Trazodone.  #6 hypertension BP meds were held secondary to hypotension. Patient was hydrated with IV fluids. Patient's blood pressure remained stable and patient will be discharged off her antihypertensive medications.  #7 hyperkalemia Resolved.  #8 gastroesophageal reflux disease Patient was maintained on a PPI.   Procedures:  Chest x-ray 03/09/2016  Consultations:  None  Discharge Exam: Filed Vitals:   03/10/16 2214 03/11/16 0613  BP: 99/41 116/60  Pulse: 75 68  Temp: 98.7 F (37.1 C) 98.1 F (36.7 C)  Resp: 18 16    General: NAD Cardiovascular: RRR Respiratory: CTAB  Discharge Instructions   Discharge Instructions    Diet Carb Modified    Complete by:  As directed      Discharge instructions    Complete by:  As directed   Follow up at coumadin clinic for INR check on Tuesday 03/13/2016. Follow up with Madison Frees, NP in 1 week.     Increase activity slowly    Complete by:  As directed           Current Discharge Medication List    START taking these medications   Details  polyethylene glycol (MIRALAX / GLYCOLAX) packet Take 17 g by mouth 2 (two) times daily. Qty: 14 each, Refills: 0      CONTINUE these medications which have CHANGED   Details  warfarin (COUMADIN) 5 MG tablet Take 1 tablet (5 mg total) by mouth daily. If okay with coumadin clinic. Qty: 7 tablet, Refills: 0      CONTINUE these medications which have NOT CHANGED   Details  insulin aspart (NOVOLOG) 100 UNIT/ML injection Inject 5 Units into the skin 3 (three) times daily with meals. Qty: 10 mL, Refills: 2    insulin detemir (LEVEMIR) 100 UNIT/ML injection Inject 0.2 mLs (20 Units total) into the skin daily. Qty: 10 mL, Refills: 0    omeprazole  (PRILOSEC) 40 MG capsule TAKE 1 CAPSULE (40 MG TOTAL) BY MOUTH DAILY. Qty: 90 capsule, Refills: 1    ondansetron (ZOFRAN) 4 MG tablet Take 1 tablet (4 mg total) by mouth every 8 (eight) hours as needed for nausea or vomiting. Qty: 20 tablet, Refills: 1   Associated Diagnoses: Intractable vomiting with nausea, vomiting of unspecified type    pravastatin (PRAVACHOL) 20 MG tablet TAKE 1 TABLET BY MOUTH EVERY DAY Qty: 90 tablet, Refills: 0    traZODone (DESYREL) 50 MG tablet Take 50 mg by mouth at bedtime.  Refills: 0    citalopram (CELEXA) 40 MG tablet Take 1 tablet (40 mg total) by mouth daily. Qty: 90 tablet, Refills: 3   Associated Diagnoses: Depression      STOP taking these medications     lisinopril (PRINIVIL,ZESTRIL) 2.5 MG tablet      acetaminophen (TYLENOL) 325 MG tablet      NOVOLOG FLEXPEN 100 UNIT/ML FlexPen      solifenacin (VESICARE) 5 MG tablet        Allergies  Allergen Reactions  . Adhesive [Tape] Other (See Comments)    Can burn skin if left on too long  . Celebrex [Celecoxib] Rash  . Detrol [Tolterodine] Hives  Follow-up Information    Follow up with Madison Frees, NP. Schedule an appointment as soon as possible for a visit in 1 week.   Specialty:  Family Medicine   Why:  for re-evaluation of nodule and consider ultrasound evaluation. HFU.   Contact information:   [address] [place] [address] [phone]       Follow up with Madison Frees, NP On 03/13/2016.   Specialty:  Family Medicine   Why:  coumadin clinic for INR check.   Contact information:   [address] [place] [address] [phone]        The results of significant diagnostics from this hospitalization (including imaging, microbiology, ancillary and laboratory) are listed below for reference.    Significant Diagnostic Studies: Dg Chest 2 View  03/09/2016  CLINICAL DATA:  Leukocytosis. EXAM: CHEST  2 VIEW COMPARISON:  02/11/2016 FINDINGS: The heart  size and mediastinal contours are within normal limits. Diffuse bronchial wall thickening is identified bilaterally. Both lungs are clear. The visualized skeletal structures are unremarkable. IMPRESSION: 1. No evidence for pneumonia. 2. Bronchial wall thickening noted. Electronically Signed   By: Signa Kell M.D.   On: 03/09/2016 09:50   Ct Head Wo Contrast  02/26/2016  CLINICAL DATA:  Acute altered mental status with DKA. Acute encephalopathy. EXAM: CT HEAD WITHOUT CONTRAST TECHNIQUE: Contiguous axial images were obtained from the base of the skull through the vertex without contrast. COMPARISON:  02/09/2016 FINDINGS: Extensive encephalomalacia in the posterior right temporal lobe and right parietal lobe. There is also encephalomalacia in the right frontal lobe. 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 Encephalomalacia in the left occipital lobe and the left anterior parietal lobe. Stable cerebral atrophy. No evidence for acute hemorrhage, mass lesion, midline shift or hydrocephalus. Difficult to evaluate for an acute infarct based on the old ischemic changes. There is also diffuse low density throughout the white matter. Visualized sinuses are clear. No calvarial fracture. IMPRESSION: No acute intracranial abnormality. Extensive chronic ischemic changes with widespread encephalomalacia. Electronically Signed   By: Richarda Overlie M.D.   On: 02/26/2016 13:41   Ct Chest Wo Contrast  02/26/2016  CLINICAL DATA:  Short of breath. Rapid breathing. Altered mental status. EXAM: CT CHEST WITHOUT CONTRAST TECHNIQUE: Multidetector CT imaging of the chest was performed following the standard protocol without IV contrast. COMPARISON:  02/11/2016 FINDINGS: Neck base and axilla: Small low-density lesions in the thyroid consistent with sub cm cysts. No neck base or axillary masses or adenopathy. Mediastinum/Lymph Nodes: Heart normal in size. There are dense coronary artery calcifications and changes from previous cardiac  surgery and aortic valve replacement. Great vessels are not normal in caliber. No mediastinal or hilar masses or pathologically enlarged lymph nodes. Lungs/Pleura: No pulmonary mass, infiltrate, or effusion. Upper abdomen: No acute findings. Musculoskeletal: Mild degenerative changes of the visualized spine. No fractures. No osteoblastic or osteolytic lesions. IMPRESSION: 1. No acute findings.  Lungs are clear. Electronically Signed   By: Amie Portland M.D.   On: 02/26/2016 13:35   Dg Chest Port 1 View  02/11/2016  CLINICAL DATA:  Confusion EXAM: PORTABLE CHEST 1 VIEW COMPARISON:  02/08/2016 FINDINGS: Cardiomediastinal silhouette is stable. Status post median sternotomy and aortic valve replacement. Right IJ catheter with tip in right atrium. No infiltrate or pulmonary edema. No pneumothorax. IMPRESSION: No active disease. Right IJ catheter with tip in right atrium. Status post median sternotomy. Electronically Signed   By: Natasha Mead M.D.   On: 02/11/2016 12:57  Microbiology: Recent Results (from the past 240 hour(s))  Urine culture     Status: None   Collection Time: 03/08/16  4:43 PM  Result Value Ref Range Status   Specimen Description URINE, CLEAN CATCH  Final   Special Requests NONE  Final   Culture MULTIPLE SPECIES PRESENT, SUGGEST RECOLLECTION  Final   Report Status 03/10/2016 FINAL  Final     Labs: Basic Metabolic Panel:  Recent Labs Lab 03/08/16 1641 03/09/16 0950 03/10/16 0451 03/11/16 0503  NA 129* 134* 140 140  K 5.3* 4.6 4.5 4.4  CL 92* 105 110 109  CO2 23 21* 24 23  GLUCOSE 438* 234* 83 120*  BUN 27* 17 9 8   CREATININE 1.20* 0.84 0.76 0.54  CALCIUM 9.6 8.2* 8.4* 8.4*  MG  --  1.7 2.1  --    Liver Function Tests:  Recent Labs Lab 03/09/16 0950  AST 23  ALT 13*  ALKPHOS 92  BILITOT 0.5  PROT 5.2*  ALBUMIN 2.6*   No results for input(s): LIPASE, AMYLASE in the last 168 hours. No results for input(s): AMMONIA in the last 168 hours. CBC:  Recent  Labs Lab 03/08/16 1641 03/09/16 0950  WBC 12.2* 7.8  NEUTROABS 9.0* 5.0  HGB 11.5* 9.5*  HCT 34.5* 29.0*  MCV 80.6 82.6  PLT 283 241   Cardiac Enzymes: No results for input(s): CKTOTAL, CKMB, CKMBINDEX, TROPONINI in the last 168 hours. BNP: BNP (last 3 results)  Recent Labs  07/30/15 1400  BNP 398.4*    ProBNP (last 3 results) No results for input(s): PROBNP in the last 8760 hours.  CBG:  Recent Labs Lab 03/10/16 1701 03/10/16 2219 03/11/16 0245 03/11/16 0755 03/11/16 1246  GLUCAP 143* 107* 71 151* 231*       Signed:  Shawnda Mauney MD.  [facility] 03/11/2016, 2:36 PM

## 2016-03-11 NOTE — Progress Notes (Signed)
ANTICOAGULATION CONSULT NOTE   Pharmacy Consult for warfarin Indication: s/p aortic valve replacement  Allergies  Allergen Reactions  . Adhesive [Tape] Other (See Comments)    Can burn skin if left on too long  . Celebrex [Celecoxib] Rash  . Detrol [Tolterodine] Hives      Vital Signs: Temp: 98.1 F (36.7 C) (04/23 0613) Temp Source: Oral (04/23 8144) BP: 116/60 mmHg (04/23 8185) Pulse Rate: 68 (04/23 0613)  Labs:  Recent Labs  03/08/16 1641  03/09/16 0950 03/10/16 0451 03/11/16 0503  HGB 11.5*  --  9.5*  --   --   HCT 34.5*  --  29.0*  --   --   PLT 283  --  241  --   --   LABPROT  --   < > 26.6* 34.7* 36.5*  INR  --   < > 2.59* 3.69* 3.95*  CREATININE 1.20*  --  0.84 0.76 0.54  < > = values in this interval not displayed.  Estimated Creatinine Clearance: 71.6 mL/min (by C-G formula based on Cr of 0.54).   Medical History: Past Medical History  Diagnosis Date  . CAD (coronary artery disease)   . Obesity   . Hypercholesteremia   . HTN (hypertension)   . Dyslipidemia   . Aortic stenosis   . Thrombophlebitis   . Heart murmur   . Myocardial infarction (HCC) 12/2014  . Pneumonia 11/2014; 12/2014  . GERD (gastroesophageal reflux disease)   . Stroke syndrome [facility]) 1995; 2001    "when my son was born; problems w/speech and L hand since then" (01/19/2015)  . Anxiety   . Depression   . Allergy   . CHF (congestive heart failure) (HCC)   . Rheumatoid arthritis(714.0)     "hands" (07/01/2015)  . Diabetes (HCC) dx'd 1982    "went straight to insulin"      Assessment: 55 y.o. Female comes from [facility] with c/o hyperglycemia and hypotension (CBG 320-420 at PCP office and BP 88/40) for evaluation.  Patient had a fall yesterday, but denies hitting head. Patient denies pain. 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 pharmacy consulted to dose warfarin for mechanical AVR.    Patient takes warfarin 5mg  daily (LD 4/19), dose was  reduced at recent discharge 4/13.  She was previously on 7.5mg  PO daily.   03/11/2016  INR  supratherapeutic with boosted warfarin dose of 7.5mg  on 4/20PM (given in response to low INR at time of admission)  CBC: last check 4/21 - Hgb dec, pltc WNL  Carb mod diet  No major drug-drug interactions  Goal of Therapy:  INR 2.5-3.5    Plan:   No warfarin tonight due to INR rise and being above goal upper enf of goal (=3.5)  If discharged, recheck INR in 1-2 days and likely can resume prior to admission dosing of warfarin 5mg  daily   Daily INR while in hosptal  5/21, PharmD, BCPS.   Pager: 03/11/2016 10:36 AM

## 2016-03-13 ENCOUNTER — Ambulatory Visit (INDEPENDENT_AMBULATORY_CARE_PROVIDER_SITE_OTHER): Payer: Commercial Managed Care - HMO | Admitting: General Practice

## 2016-03-13 ENCOUNTER — Encounter: Payer: Self-pay | Admitting: Adult Health

## 2016-03-13 ENCOUNTER — Other Ambulatory Visit: Payer: Self-pay

## 2016-03-13 ENCOUNTER — Ambulatory Visit (INDEPENDENT_AMBULATORY_CARE_PROVIDER_SITE_OTHER): Payer: Commercial Managed Care - HMO | Admitting: Adult Health

## 2016-03-13 ENCOUNTER — Telehealth: Payer: Self-pay | Admitting: *Deleted

## 2016-03-13 VITALS — BP 102/60 | HR 112 | Temp 99.0°F | Wt 137.0 lb

## 2016-03-13 DIAGNOSIS — Z954 Presence of other heart-valve replacement: Secondary | ICD-10-CM

## 2016-03-13 DIAGNOSIS — E1065 Type 1 diabetes mellitus with hyperglycemia: Secondary | ICD-10-CM

## 2016-03-13 DIAGNOSIS — Z7901 Long term (current) use of anticoagulants: Secondary | ICD-10-CM

## 2016-03-13 DIAGNOSIS — E108 Type 1 diabetes mellitus with unspecified complications: Secondary | ICD-10-CM | POA: Diagnosis not present

## 2016-03-13 DIAGNOSIS — Z09 Encounter for follow-up examination after completed treatment for conditions other than malignant neoplasm: Secondary | ICD-10-CM

## 2016-03-13 DIAGNOSIS — I959 Hypotension, unspecified: Secondary | ICD-10-CM

## 2016-03-13 DIAGNOSIS — Z952 Presence of prosthetic heart valve: Secondary | ICD-10-CM

## 2016-03-13 DIAGNOSIS — E1069 Type 1 diabetes mellitus with other specified complication: Secondary | ICD-10-CM

## 2016-03-13 DIAGNOSIS — R739 Hyperglycemia, unspecified: Secondary | ICD-10-CM

## 2016-03-13 DIAGNOSIS — IMO0002 Reserved for concepts with insufficient information to code with codable children: Secondary | ICD-10-CM

## 2016-03-13 LAB — POCT GLUCOSE (DEVICE FOR HOME USE): POC Glucose: 343 mg/dl — AB (ref 70–99)

## 2016-03-13 LAB — BASIC METABOLIC PANEL
BUN: 19 mg/dL (ref 6–23)
CO2: 19 mEq/L (ref 19–32)
Calcium: 9.4 mg/dL (ref 8.4–10.5)
Chloride: 94 mEq/L — ABNORMAL LOW (ref 96–112)
Creatinine, Ser: 0.98 mg/dL (ref 0.40–1.20)
GFR: 62.57 mL/min (ref >60.00)
Glucose, Bld: 387 mg/dL — ABNORMAL HIGH (ref 70–99)
Potassium: 4.5 mEq/L (ref 3.5–5.1)
SODIUM: 130 meq/L — AB (ref 135–145)

## 2016-03-13 LAB — POCT INR: INR: 1.4

## 2016-03-13 MED ORDER — INSULIN DETEMIR 100 UNIT/ML ~~LOC~~ SOLN: 25.0000 [IU] | Freq: Every day | SUBCUTANEOUS | Status: DC

## 2016-03-13 NOTE — Progress Notes (Signed)
I agree with this plan.

## 2016-03-13 NOTE — Progress Notes (Addendum)
Subjective:    Patient ID: Madison [name], female    DOB: 07-12-1961, 55 y.o.   MRN: [id number]  HPI  55 year old female who presents to the office today for transitional care visit. I last saw her on 03/08/2016 for hyperglycemia, hypotension, and vomiting. I had to send her to the ER via ambulance for hyperglycemia.   She was admitted for IV hydration, hypotension, and uncontrolled DM 2 on 4/20 and discharged on 4/23.   Her BP medications were stopped and she has not taken them since being discharged. Coumadin was also stopped due to high INR. She has not taken Coumadin since being discharged.   Today in the office she reports that her blood sugars have been in the 400's since being discharged. She has not eaten anything today.   She has her strength back to baseline.   While in the hospital she had a chest xray done, which showed: FINDINGS: The heart size and mediastinal contours are within normal limits. Diffuse bronchial wall thickening is identified bilaterally. Both lungs are clear. The visualized skeletal structures are unremarkable.  IMPRESSION: 1. No evidence for pneumonia. 2. Bronchial wall thickening noted.  Review of Systems  Constitutional: Negative.   HENT: Negative.   Respiratory: Negative.   Cardiovascular: Negative.   Gastrointestinal: Negative.   Genitourinary: Negative.   Musculoskeletal: Positive for myalgias, back pain, arthralgias and gait problem.  Skin: Negative.   Neurological: Negative.   Psychiatric/Behavioral: Negative.   All other systems reviewed and are negative.  Past Medical History  Diagnosis Date  . CAD (coronary artery disease)   . Obesity   . Hypercholesteremia   . HTN (hypertension)   . Dyslipidemia   . Aortic stenosis   . Thrombophlebitis   . Heart murmur   . Myocardial infarction (HCC) 12/2014  . Pneumonia 11/2014; 12/2014  . GERD (gastroesophageal reflux disease)   . Stroke syndrome [facility]) 1995; 2001    "when my son was born;  problems w/speech and L hand since then" (01/19/2015)  . Anxiety   . Depression   . Allergy   . CHF (congestive heart failure) (HCC)   . Rheumatoid arthritis(714.0)     "hands" (07/01/2015)  . Diabetes (HCC) dx'd 1982    "went straight to insulin"    Social History   Social History  . Marital Status: Divorced    Spouse Name: N/A  . Number of Children: N/A  . Years of Education: N/A   Occupational History  . Not on file.   Social History Main Topics  . Smoking status: Never Smoker   . Smokeless tobacco: Never Used  . Alcohol Use: No  . Drug Use: No  . Sexual Activity: No   Other Topics Concern  . Not on file   Social History Narrative   Divroced and lives at home.    Has two sons who live in [place] at this time. (21&8 y/o).    Has a cat    Walks a lot.       She is having great success with her diet       Working with exercises that were given to her by PT/OT.     Past Surgical History  Procedure Laterality Date  . Cesarean section  1995  . Foot fracture surgery    . Knee arthroscopy Right   . Neuroplasty / transposition median nerve at carpal tunnel    . Left heart catheterization with coronary angiogram N/A 12/08/2014  Procedure: LEFT HEART CATHETERIZATION WITH CORONARY ANGIOGRAM;  Surgeon: Iran Ouch, MD;  Location: [facility] CATH LAB;  Service: Cardiovascular;  Laterality: N/A;  . Percutaneous coronary stent intervention (pci-s) N/A 12/09/2014    Procedure: PERCUTANEOUS CORONARY STENT INTERVENTION (PCI-S);  Surgeon: [name], MD;  Location: [facility] CATH LAB;  Service: Cardiovascular;  Laterality: N/A;  . Cardiac catheterization  12/08/2014  . Coronary angioplasty with stent placement  12/09/2014  . Aortic valve replacement (avr)/coronary artery bypass grafting (cabg)  "2014"    [name] 03/08/2014  . Fracture surgery    . Tubal ligation  1995  . Cataract extraction Left   . Coronary artery bypass graft    . Orif ankle fracture Left 07/04/2015    Procedure: OPEN  REDUCTION INTERNAL FIXATION (ORIF)  TRIMAL ANKLE FRACTURE;  Surgeon: [name], MD;  Location: [facility] OR;  Service: Orthopedics;  Laterality: Left;    Family History  Problem Relation Age of Onset  . Stroke    . Heart disease Mother   . Heart disease Sister     Allergies  Allergen Reactions  . Adhesive [Tape] Other (See Comments)    Can burn skin if left on too long  . Celebrex [Celecoxib] Rash  . Detrol [Tolterodine] Hives    Current Outpatient Prescriptions on File Prior to Visit  Medication Sig Dispense Refill  . citalopram (CELEXA) 40 MG tablet Take 1 tablet (40 mg total) by mouth daily. 90 tablet 3  . insulin aspart (NOVOLOG) 100 UNIT/ML injection Inject 5 Units into the skin 3 (three) times daily with meals. 10 mL 2  . insulin detemir (LEVEMIR) 100 UNIT/ML injection Inject 0.2 mLs (20 Units total) into the skin daily. 10 mL 0  . omeprazole (PRILOSEC) 40 MG capsule TAKE 1 CAPSULE (40 MG TOTAL) BY MOUTH DAILY. 90 capsule 1  . ondansetron (ZOFRAN) 4 MG tablet Take 1 tablet (4 mg total) by mouth every 8 (eight) hours as needed for nausea or vomiting. 20 tablet 1  . polyethylene glycol (MIRALAX / GLYCOLAX) packet Take 17 g by mouth 2 (two) times daily. 14 each 0  . pravastatin (PRAVACHOL) 20 MG tablet TAKE 1 TABLET BY MOUTH EVERY DAY 90 tablet 0  . traZODone (DESYREL) 50 MG tablet Take 50 mg by mouth at bedtime.   0  . warfarin (COUMADIN) 5 MG tablet Take 1 tablet (5 mg total) by mouth daily. If okay with coumadin clinic. 7 tablet 0   No current facility-administered medications on file prior to visit.    BP 102/60 mmHg  Pulse 112  Temp(Src) 99 F (37.2 C) (Oral)  Wt 137 lb (62.143 kg)  SpO2 97%       Objective:   Physical Exam  Constitutional: She is oriented to person, place, and time. She appears well-developed and well-nourished. No distress.  Cardiovascular: Normal rate, regular rhythm, normal heart sounds and intact distal pulses.  Exam reveals no gallop and no  friction rub.   No murmur heard. Pulmonary/Chest: Effort normal and breath sounds normal. No respiratory distress. She has no wheezes. She has no rales. She exhibits no tenderness.  Musculoskeletal: She exhibits no edema or tenderness.  Walks with a slow limping gait  Neurological: She is alert and oriented to person, place, and time.  Skin: Skin is warm and dry. No rash noted. She is not diaphoretic. No erythema. No pallor.  Psychiatric: She has a normal mood and affect. Her behavior is normal. Judgment and thought content normal.  Nursing  note and vitals reviewed.     Assessment & Plan:  1. Hospital discharge follow-up - Stay well hydrated  - Eat healthy foods - Monitor BS and BP at home - Follow up in 10 days.   2. Uncontrolled type 1 diabetes mellitus with complication (HCC) - Ambulatory referral to Endocrinology - Continue to monitor - Increase Levemir from 20 units to 25 units daily.  - Follow up in 10 days 3. Hyperglycemia - POCT Glucose (Device for Home Use) - Basic metabolic panel - POC INR - Continue to monitor - Increase Levemir from 20 units to 25 units daily.  - Follow up in 10 days 4. Hypotension, unspecified hypotension type - Continue to not take BP medications - Follow up in 10 days - If possible, monitor BP at home and inform me of the results, especially if it is becoming higher than 140 systolic.   5. S/P aortic valve replacement - POC INR - Restart Coumadin per recommendations.    [name], NP

## 2016-03-13 NOTE — Patient Instructions (Signed)
It was great seeing you and I am happy you are feeling better.   I want to see you back in 10 days.   Do not take your blood pressure medications.   Go up five units on Insulin Levemir.   Someone will call you to schedule your Endocrinology appointment.

## 2016-03-13 NOTE — Telephone Encounter (Signed)
Transition Care Management Follow-up Telephone Call  How have you been since you were released from the hospital? okay   Do you understand why you were in the hospital? yes   Do you understand the discharge instrcutions? yes  Items Reviewed:  Medications reviewed: yes  Allergies reviewed: yes  Dietary changes reviewed: yes  Referrals reviewed: yes   Functional Questionnaire:   Activities of Daily Living (ADLs):   She states they are independent in the following: ambulation, bathing and hygiene, feeding, continence, grooming, toileting and dressing States they require assistance with the following: none   Any transportation issues/concerns?: no   Any patient concerns? no   Confirmed importance and date/time of follow-up visits scheduled: yes   Confirmed with patient if condition begins to worsen call PCP or go to the ER.  Patient was given the Call-a-Nurse line [phone]: yes Patient was discharged 03/11/16 Patient was discharged to home Patient has an appointment 03/13/16

## 2016-03-13 NOTE — Progress Notes (Signed)
Pre visit review using our clinic review tool, if applicable. No additional management support is needed unless otherwise documented below in the visit note. INR is low today due to patient being in the hospital.  Patient was discharged on 4/23.  INR was 3.9 on discharge but patient misunderstood discharge instructions and stopped coumadin.  Today patient was 1.4 so medication was re-started and patient will be re-checked in 1 week to monitor progress.

## 2016-03-13 NOTE — Progress Notes (Signed)
Pre visit review using our clinic review tool, if applicable. No additional management support is needed unless otherwise documented below in the visit note. 

## 2016-03-13 NOTE — Patient Outreach (Signed)
Telephonic contact with patient to scheduled home visit for transition of care.  Patient identified herself by providing date of birth and address.  Patient stated she has been throwing up since yesterday and continues to experience nausea with the vomiting. This RNCM encouraged to seek medical attention who agreed. This RNCM made call to primary care provider office, appointment obtained for today at 130 pm.  After several calls, this RNCM was able to secure transportation who will be paid for by [facility]. Attempted several times to call patient back to update her without success.  Telephone calls were  Unanswered.  This RNCM went to patient's home to deliver information. Was told to come in when I knocked on the door. Patient was lying on couch, in home aide was in kitchen working   Aide stated she was cleaning up the emesis patient from the kitchen sink. This RNCM assist patient in obtaining her blood glucose.  READING WAS 549. This RNCM reviewed patient's meals from yesterday and today.    Patient denies eating anything stating she has been nauseated. In Home Aide reports patient gets something from [employer] or [employer] everyday. Aide states further that patient had ordered a cheesecake milk shake from [employer] today.  Aide also asked to remind patient to take her glucose levels every day. This RNCM checked for medications, patient has all ordered insulin.  In home aide was assisting patient with getting dressed for ride to doctors office. Patient asked to call [employer] when her appointment was completed.  Call made to hospital Liaison, Raiford Noble, RN to inform her of this encounter.  Plan: Telephone call tomorrow to follow with patient's appoitnment with primary care provider    Mental Health does not have an appointment, "I cannot remember the name."

## 2016-03-14 ENCOUNTER — Telehealth: Payer: Self-pay | Admitting: Adult Health

## 2016-03-14 ENCOUNTER — Other Ambulatory Visit: Payer: Self-pay

## 2016-03-14 DIAGNOSIS — I110 Hypertensive heart disease with heart failure: Secondary | ICD-10-CM | POA: Diagnosis not present

## 2016-03-14 DIAGNOSIS — M0600 Rheumatoid arthritis without rheumatoid factor, unspecified site: Secondary | ICD-10-CM | POA: Diagnosis not present

## 2016-03-14 DIAGNOSIS — I5042 Chronic combined systolic (congestive) and diastolic (congestive) heart failure: Secondary | ICD-10-CM | POA: Diagnosis not present

## 2016-03-14 DIAGNOSIS — R1314 Dysphagia, pharyngoesophageal phase: Secondary | ICD-10-CM | POA: Diagnosis not present

## 2016-03-14 DIAGNOSIS — E785 Hyperlipidemia, unspecified: Secondary | ICD-10-CM | POA: Diagnosis not present

## 2016-03-14 DIAGNOSIS — I350 Nonrheumatic aortic (valve) stenosis: Secondary | ICD-10-CM | POA: Diagnosis not present

## 2016-03-14 DIAGNOSIS — D649 Anemia, unspecified: Secondary | ICD-10-CM | POA: Diagnosis not present

## 2016-03-14 DIAGNOSIS — I2510 Atherosclerotic heart disease of native coronary artery without angina pectoris: Secondary | ICD-10-CM | POA: Diagnosis not present

## 2016-03-14 DIAGNOSIS — E109 Type 1 diabetes mellitus without complications: Secondary | ICD-10-CM | POA: Diagnosis not present

## 2016-03-14 NOTE — Telephone Encounter (Signed)
Pt was seen yesterday and does not remember what medication cory told her not to take. Please call pt

## 2016-03-14 NOTE — Telephone Encounter (Signed)
Patient notified and verbalized understanding. Thanks! 

## 2016-03-14 NOTE — Telephone Encounter (Signed)
Please advise 

## 2016-03-14 NOTE — Telephone Encounter (Signed)
Vesicare and Lisinopril

## 2016-03-15 ENCOUNTER — Encounter: Payer: Self-pay | Admitting: *Deleted

## 2016-03-15 NOTE — Patient Outreach (Signed)
unsuccessful attempt made to contact patient via telephone.  Will make another attempt on tomorrow, April 27

## 2016-03-16 ENCOUNTER — Other Ambulatory Visit: Payer: Self-pay | Admitting: *Deleted

## 2016-03-16 ENCOUNTER — Other Ambulatory Visit: Payer: Self-pay

## 2016-03-16 ENCOUNTER — Telehealth: Payer: Self-pay | Admitting: Adult Health

## 2016-03-16 MED ORDER — ONETOUCH BASIC SYSTEM W/DEVICE KIT: PACK | Status: DC

## 2016-03-16 MED ORDER — GLUCOSE BLOOD VI STRP: ORAL_STRIP | Status: DC

## 2016-03-16 NOTE — Telephone Encounter (Signed)
[facility] so is ready when patient is able to pick it up

## 2016-03-16 NOTE — Patient Outreach (Addendum)
This RNCM made follow up telephone with patient's chronic disease management. Patient identified herself by providing date of birth and address.  Patient stated she was not able to provide me with her glucose results for the last 2 days because her glucometer is broken.  Patient stated the nurse from [facility] had notified her doctor, who was suppose to call back later today.  Patient sated she has not money and cannot afford to pay for a new meter.  This RNCM made call to [facility] [phone], spoke with Dorene Grebe, Pharmacist who advised this RNCM  They could provide a One Touch or Advandia Meter at no cost, however, the strips would cost.  Patient has One Touch Strips. Patient advised of Pharmacy's willingness to assist by providing her a free meter.  Patient is to send her aide to pick up meter at the pharmacy tomorrow.    Plan: Call patient next week for follow up with glucose monitoring and daily weights.

## 2016-03-16 NOTE — Telephone Encounter (Signed)
Pt need glucometer for this weekend.

## 2016-03-16 NOTE — Telephone Encounter (Signed)
Spoke to patient and she states she does not have ride to come get machine. Her home helper will be there in the morning to take [facility] to [facility] to get [facility]. Educated patient to decrease carb intake and sugar intake while increasing fluid until able to check glucose. Patient verbalized understanding.

## 2016-03-16 NOTE — Telephone Encounter (Signed)
The patients glucometer quit working and is needing a new one. She is using a One Touch right now and was wanting [facility] to [facility] or to be able to come pick one up from here.

## 2016-03-17 ENCOUNTER — Inpatient Hospital Stay (HOSPITAL_COMMUNITY): Payer: Commercial Managed Care - HMO

## 2016-03-17 ENCOUNTER — Inpatient Hospital Stay (HOSPITAL_COMMUNITY)
Admission: EM | Admit: 2016-03-17 | Discharge: 2016-03-20 | DRG: 637 | Disposition: A | Discharge status: Home / Self Care | Payer: Commercial Managed Care - HMO | Attending: Internal Medicine | Admitting: Internal Medicine

## 2016-03-17 ENCOUNTER — Emergency Department (HOSPITAL_COMMUNITY): Payer: Commercial Managed Care - HMO

## 2016-03-17 ENCOUNTER — Encounter (HOSPITAL_COMMUNITY): Payer: Self-pay | Admitting: Emergency Medicine

## 2016-03-17 DIAGNOSIS — Z9119 Patient's noncompliance with other medical treatment and regimen: Secondary | ICD-10-CM

## 2016-03-17 DIAGNOSIS — Z955 Presence of coronary angioplasty implant and graft: Secondary | ICD-10-CM | POA: Diagnosis not present

## 2016-03-17 DIAGNOSIS — R748 Abnormal levels of other serum enzymes: Secondary | ICD-10-CM | POA: Diagnosis present

## 2016-03-17 DIAGNOSIS — R739 Hyperglycemia, unspecified: Secondary | ICD-10-CM | POA: Diagnosis not present

## 2016-03-17 DIAGNOSIS — F329 Major depressive disorder, single episode, unspecified: Secondary | ICD-10-CM | POA: Diagnosis present

## 2016-03-17 DIAGNOSIS — I110 Hypertensive heart disease with heart failure: Secondary | ICD-10-CM | POA: Diagnosis not present

## 2016-03-17 DIAGNOSIS — K219 Gastro-esophageal reflux disease without esophagitis: Secondary | ICD-10-CM | POA: Diagnosis present

## 2016-03-17 DIAGNOSIS — I10 Essential (primary) hypertension: Secondary | ICD-10-CM | POA: Diagnosis not present

## 2016-03-17 DIAGNOSIS — Z8673 Personal history of transient ischemic attack (TIA), and cerebral infarction without residual deficits: Secondary | ICD-10-CM | POA: Diagnosis not present

## 2016-03-17 DIAGNOSIS — I9589 Other hypotension: Secondary | ICD-10-CM | POA: Diagnosis present

## 2016-03-17 DIAGNOSIS — I2510 Atherosclerotic heart disease of native coronary artery without angina pectoris: Secondary | ICD-10-CM | POA: Diagnosis present

## 2016-03-17 DIAGNOSIS — Z95828 Presence of other vascular implants and grafts: Secondary | ICD-10-CM

## 2016-03-17 DIAGNOSIS — Z8249 Family history of ischemic heart disease and other diseases of the circulatory system: Secondary | ICD-10-CM

## 2016-03-17 DIAGNOSIS — E872 Acidosis, unspecified: Secondary | ICD-10-CM

## 2016-03-17 DIAGNOSIS — Z79899 Other long term (current) drug therapy: Secondary | ICD-10-CM | POA: Diagnosis not present

## 2016-03-17 DIAGNOSIS — Z794 Long term (current) use of insulin: Secondary | ICD-10-CM | POA: Diagnosis not present

## 2016-03-17 DIAGNOSIS — E785 Hyperlipidemia, unspecified: Secondary | ICD-10-CM | POA: Diagnosis present

## 2016-03-17 DIAGNOSIS — E7800 Pure hypercholesterolemia, unspecified: Secondary | ICD-10-CM | POA: Diagnosis present

## 2016-03-17 DIAGNOSIS — IMO0002 Reserved for concepts with insufficient information to code with codable children: Secondary | ICD-10-CM | POA: Diagnosis present

## 2016-03-17 DIAGNOSIS — E869 Volume depletion, unspecified: Secondary | ICD-10-CM | POA: Diagnosis present

## 2016-03-17 DIAGNOSIS — I5032 Chronic diastolic (congestive) heart failure: Secondary | ICD-10-CM | POA: Diagnosis present

## 2016-03-17 DIAGNOSIS — Z888 Allergy status to other drugs, medicaments and biological substances status: Secondary | ICD-10-CM | POA: Diagnosis not present

## 2016-03-17 DIAGNOSIS — I959 Hypotension, unspecified: Secondary | ICD-10-CM

## 2016-03-17 DIAGNOSIS — Z9109 Other allergy status, other than to drugs and biological substances: Secondary | ICD-10-CM

## 2016-03-17 DIAGNOSIS — I5042 Chronic combined systolic (congestive) and diastolic (congestive) heart failure: Secondary | ICD-10-CM | POA: Diagnosis present

## 2016-03-17 DIAGNOSIS — R791 Abnormal coagulation profile: Secondary | ICD-10-CM | POA: Diagnosis present

## 2016-03-17 DIAGNOSIS — D72829 Elevated white blood cell count, unspecified: Secondary | ICD-10-CM | POA: Diagnosis present

## 2016-03-17 DIAGNOSIS — J9600 Acute respiratory failure, unspecified whether with hypoxia or hypercapnia: Secondary | ICD-10-CM

## 2016-03-17 DIAGNOSIS — Z951 Presence of aortocoronary bypass graft: Secondary | ICD-10-CM | POA: Diagnosis not present

## 2016-03-17 DIAGNOSIS — R6510 Systemic inflammatory response syndrome (SIRS) of non-infectious origin without acute organ dysfunction: Secondary | ICD-10-CM | POA: Diagnosis not present

## 2016-03-17 DIAGNOSIS — N179 Acute kidney failure, unspecified: Secondary | ICD-10-CM | POA: Diagnosis not present

## 2016-03-17 DIAGNOSIS — Z952 Presence of prosthetic heart valve: Secondary | ICD-10-CM

## 2016-03-17 DIAGNOSIS — E10649 Type 1 diabetes mellitus with hypoglycemia without coma: Secondary | ICD-10-CM | POA: Diagnosis not present

## 2016-03-17 DIAGNOSIS — M069 Rheumatoid arthritis, unspecified: Secondary | ICD-10-CM | POA: Diagnosis present

## 2016-03-17 DIAGNOSIS — E1310 Other specified diabetes mellitus with ketoacidosis without coma: Secondary | ICD-10-CM | POA: Diagnosis not present

## 2016-03-17 DIAGNOSIS — Z8672 Personal history of thrombophlebitis: Secondary | ICD-10-CM | POA: Diagnosis not present

## 2016-03-17 DIAGNOSIS — G9340 Encephalopathy, unspecified: Secondary | ICD-10-CM | POA: Diagnosis not present

## 2016-03-17 DIAGNOSIS — Z7901 Long term (current) use of anticoagulants: Secondary | ICD-10-CM

## 2016-03-17 DIAGNOSIS — E1010 Type 1 diabetes mellitus with ketoacidosis without coma: Secondary | ICD-10-CM | POA: Diagnosis not present

## 2016-03-17 DIAGNOSIS — E669 Obesity, unspecified: Secondary | ICD-10-CM | POA: Diagnosis present

## 2016-03-17 DIAGNOSIS — E8729 Other acidosis: Secondary | ICD-10-CM

## 2016-03-17 DIAGNOSIS — Z452 Encounter for adjustment and management of vascular access device: Secondary | ICD-10-CM | POA: Diagnosis not present

## 2016-03-17 DIAGNOSIS — E875 Hyperkalemia: Secondary | ICD-10-CM | POA: Diagnosis not present

## 2016-03-17 DIAGNOSIS — Z823 Family history of stroke: Secondary | ICD-10-CM | POA: Diagnosis not present

## 2016-03-17 DIAGNOSIS — E860 Dehydration: Secondary | ICD-10-CM | POA: Diagnosis not present

## 2016-03-17 DIAGNOSIS — D638 Anemia in other chronic diseases classified elsewhere: Secondary | ICD-10-CM | POA: Diagnosis present

## 2016-03-17 DIAGNOSIS — I252 Old myocardial infarction: Secondary | ICD-10-CM

## 2016-03-17 DIAGNOSIS — Z6826 Body mass index (BMI) 26.0-26.9, adult: Secondary | ICD-10-CM

## 2016-03-17 DIAGNOSIS — E1065 Type 1 diabetes mellitus with hyperglycemia: Secondary | ICD-10-CM | POA: Diagnosis present

## 2016-03-17 DIAGNOSIS — R1110 Vomiting, unspecified: Secondary | ICD-10-CM | POA: Diagnosis not present

## 2016-03-17 DIAGNOSIS — R4182 Altered mental status, unspecified: Secondary | ICD-10-CM | POA: Diagnosis present

## 2016-03-17 DIAGNOSIS — E1110 Type 2 diabetes mellitus with ketoacidosis without coma: Secondary | ICD-10-CM | POA: Diagnosis present

## 2016-03-17 DIAGNOSIS — I5022 Chronic systolic (congestive) heart failure: Secondary | ICD-10-CM | POA: Diagnosis not present

## 2016-03-17 DIAGNOSIS — R7309 Other abnormal glucose: Secondary | ICD-10-CM | POA: Diagnosis not present

## 2016-03-17 LAB — BASIC METABOLIC PANEL
ANION GAP: 14 (ref 5–15)
BUN: 26 mg/dL — ABNORMAL HIGH (ref 6–20)
BUN: 20 mg/dL (ref 6–20)
CALCIUM: 8 mg/dL — AB (ref 8.9–10.3)
CHLORIDE: 89 mmol/L — AB (ref 101–111)
CO2: <7 mmol/L — ABNORMAL LOW (ref 22–32)
CO2: 12 mmol/L — AB (ref 22–32)
CREATININE: 1.9 mg/dL — AB (ref 0.44–1.00)
CREATININE: 1.36 mg/dL — AB (ref 0.44–1.00)
Calcium: 9.1 mg/dL (ref 8.9–10.3)
Chloride: 115 mmol/L — ABNORMAL HIGH (ref 101–111)
GFR calc non Af Amer: 29 mL/min — ABNORMAL LOW (ref >60)
GFR, EST AFRICAN AMERICAN: 33 mL/min — AB (ref >60)
GFR, EST AFRICAN AMERICAN: 50 mL/min — AB (ref >60)
GFR, EST NON AFRICAN AMERICAN: 43 mL/min — AB (ref >60)
Glucose, Bld: 1187 mg/dL (ref 65–99)
Glucose, Bld: 300 mg/dL — ABNORMAL HIGH (ref 65–99)
Potassium: 7.3 mmol/L (ref 3.5–5.1)
Potassium: 5.5 mmol/L — ABNORMAL HIGH (ref 3.5–5.1)
SODIUM: 128 mmol/L — AB (ref 135–145)
SODIUM: 141 mmol/L (ref 135–145)

## 2016-03-17 LAB — URINALYSIS, ROUTINE W REFLEX MICROSCOPIC
Bilirubin Urine: NEGATIVE
Hgb urine dipstick: NEGATIVE
Ketones, ur: >80 mg/dL — AB
LEUKOCYTES UA: NEGATIVE
Nitrite: NEGATIVE
PROTEIN: NEGATIVE mg/dL
Specific Gravity, Urine: 1.025 (ref 1.005–1.030)
pH: 5 (ref 5.0–8.0)

## 2016-03-17 LAB — COMPREHENSIVE METABOLIC PANEL
ALT: 17 U/L (ref 14–54)
ANION GAP: 24 — AB (ref 5–15)
AST: 21 U/L (ref 15–41)
Albumin: 3.1 g/dL — ABNORMAL LOW (ref 3.5–5.0)
Alkaline Phosphatase: 128 U/L — ABNORMAL HIGH (ref 38–126)
BUN: 24 mg/dL — AB (ref 6–20)
CALCIUM: 7.9 mg/dL — AB (ref 8.9–10.3)
CHLORIDE: 105 mmol/L (ref 101–111)
CO2: 6 mmol/L — AB (ref 22–32)
CREATININE: 1.79 mg/dL — AB (ref 0.44–1.00)
GFR, EST AFRICAN AMERICAN: 36 mL/min — AB (ref >60)
GFR, EST NON AFRICAN AMERICAN: 31 mL/min — AB (ref >60)
GLUCOSE: 835 mg/dL — AB (ref 65–99)
Potassium: 5.1 mmol/L (ref 3.5–5.1)
Sodium: 135 mmol/L (ref 135–145)
Total Bilirubin: 1.3 mg/dL — ABNORMAL HIGH (ref 0.3–1.2)
Total Protein: 5.9 g/dL — ABNORMAL LOW (ref 6.5–8.1)

## 2016-03-17 LAB — CBC WITH DIFFERENTIAL/PLATELET
Basophils Absolute: 0 10*3/uL (ref 0.0–0.1)
Basophils Relative: 0 %
EOS ABS: 0 10*3/uL (ref 0.0–0.7)
EOS PCT: 0 %
HCT: 28.7 % — ABNORMAL LOW (ref 36.0–46.0)
Hemoglobin: 8.8 g/dL — ABNORMAL LOW (ref 12.0–15.0)
LYMPHS ABS: 2.2 10*3/uL (ref 0.7–4.0)
Lymphocytes Relative: 11 %
MCH: 27.6 pg (ref 26.0–34.0)
MCHC: 30.7 g/dL (ref 30.0–36.0)
MCV: 90 fL (ref 78.0–100.0)
MONO ABS: 0.6 10*3/uL (ref 0.1–1.0)
Monocytes Relative: 3 %
NEUTROS PCT: 86 %
Neutro Abs: 17.1 10*3/uL — ABNORMAL HIGH (ref 1.7–7.7)
PLATELETS: 354 10*3/uL (ref 150–400)
RBC: 3.19 MIL/uL — AB (ref 3.87–5.11)
RDW: 19.8 % — AB (ref 11.5–15.5)
WBC: 19.9 10*3/uL — AB (ref 4.0–10.5)

## 2016-03-17 LAB — BLOOD GAS, VENOUS
ACID-BASE DEFICIT: 28.4 mmol/L — AB (ref 0.0–2.0)
Bicarbonate: 3.3 mEq/L — ABNORMAL LOW (ref 20.0–24.0)
FIO2: 0.21
O2 SAT: 64.8 %
Patient temperature: 98.6
TCO2: 3.5 mmol/L (ref 0–100)
pCO2, Ven: 17.2 mmHg — ABNORMAL LOW (ref 45.0–50.0)
pH, Ven: 6.909 — CL (ref 7.250–7.300)
pO2, Ven: 54.8 mmHg — ABNORMAL HIGH (ref 31.0–45.0)

## 2016-03-17 LAB — PHOSPHORUS: Phosphorus: 6 mg/dL — ABNORMAL HIGH (ref 2.5–4.6)

## 2016-03-17 LAB — TROPONIN I: TROPONIN I: 0.03 ng/mL (ref <0.031)

## 2016-03-17 LAB — I-STAT CHEM 8, ED
BUN: 28 mg/dL — ABNORMAL HIGH (ref 6–20)
Calcium, Ion: 1.13 mmol/L (ref 1.12–1.23)
Chloride: 96 mmol/L — ABNORMAL LOW (ref 101–111)
Creatinine, Ser: 1.3 mg/dL — ABNORMAL HIGH (ref 0.44–1.00)
Glucose, Bld: >700 mg/dL (ref 65–99)
HEMATOCRIT: 39 % (ref 36.0–46.0)
HEMOGLOBIN: 13.3 g/dL (ref 12.0–15.0)
Potassium: 7 mmol/L (ref 3.5–5.1)
Sodium: 125 mmol/L — ABNORMAL LOW (ref 135–145)
TCO2: 5 mmol/L (ref 0–100)

## 2016-03-17 LAB — GLUCOSE, CAPILLARY
GLUCOSE-CAPILLARY: 282 mg/dL — AB (ref 65–99)
Glucose-Capillary: >600 mg/dL (ref 65–99)
Glucose-Capillary: 477 mg/dL — ABNORMAL HIGH (ref 65–99)
Glucose-Capillary: 393 mg/dL — ABNORMAL HIGH (ref 65–99)

## 2016-03-17 LAB — URINE MICROSCOPIC-ADD ON

## 2016-03-17 LAB — PROTIME-INR
INR: 3.59 — AB (ref 0.00–1.49)
INR: 3.97 — ABNORMAL HIGH (ref 0.00–1.49)
PROTHROMBIN TIME: 37.8 s — AB (ref 11.6–15.2)
Prothrombin Time: 35 seconds — ABNORMAL HIGH (ref 11.6–15.2)

## 2016-03-17 LAB — CBC
HCT: 36.4 % (ref 36.0–46.0)
Hemoglobin: 10.3 g/dL — ABNORMAL LOW (ref 12.0–15.0)
MCH: 26.9 pg (ref 26.0–34.0)
MCHC: 28.3 g/dL — ABNORMAL LOW (ref 30.0–36.0)
MCV: 95 fL (ref 78.0–100.0)
PLATELETS: 412 10*3/uL — AB (ref 150–400)
RBC: 3.83 MIL/uL — AB (ref 3.87–5.11)
RDW: 20.6 % — ABNORMAL HIGH (ref 11.5–15.5)
WBC: 14 10*3/uL — AB (ref 4.0–10.5)

## 2016-03-17 LAB — HEPATIC FUNCTION PANEL
ALBUMIN: 3.6 g/dL (ref 3.5–5.0)
ALK PHOS: 149 U/L — AB (ref 38–126)
ALT: 19 U/L (ref 14–54)
AST: 23 U/L (ref 15–41)
BILIRUBIN TOTAL: 1.5 mg/dL — AB (ref 0.3–1.2)
Bilirubin, Direct: 0.1 mg/dL (ref 0.1–0.5)
Indirect Bilirubin: 1.4 mg/dL — ABNORMAL HIGH (ref 0.3–0.9)
TOTAL PROTEIN: 6.9 g/dL (ref 6.5–8.1)

## 2016-03-17 LAB — I-STAT CG4 LACTIC ACID, ED
LACTIC ACID, VENOUS: 10.2 mmol/L — AB (ref 0.5–2.0)
LACTIC ACID, VENOUS: 6.06 mmol/L — AB (ref 0.5–2.0)

## 2016-03-17 LAB — CBG MONITORING, ED: Glucose-Capillary: >600 mg/dL (ref 65–99)

## 2016-03-17 LAB — LIPASE, BLOOD
Lipase: 27 U/L (ref 11–51)
Lipase: 73 U/L — ABNORMAL HIGH (ref 11–51)

## 2016-03-17 LAB — MAGNESIUM: Magnesium: 2.1 mg/dL (ref 1.7–2.4)

## 2016-03-17 LAB — PROCALCITONIN: PROCALCITONIN: 2.4 ng/mL

## 2016-03-17 LAB — APTT: APTT: 37 s (ref 24–37)

## 2016-03-17 LAB — MRSA PCR SCREENING: MRSA BY PCR: NEGATIVE

## 2016-03-17 LAB — BRAIN NATRIURETIC PEPTIDE: B NATRIURETIC PEPTIDE 5: 95.3 pg/mL (ref 0.0–100.0)

## 2016-03-17 LAB — LACTIC ACID, PLASMA: LACTIC ACID, VENOUS: 5.7 mmol/L — AB (ref 0.5–2.0)

## 2016-03-17 LAB — AMYLASE: Amylase: 80 U/L (ref 28–100)

## 2016-03-17 MED ORDER — SODIUM CHLORIDE 0.9 % IV BOLUS (SEPSIS)
1000.0000 mL | Freq: Once | INTRAVENOUS | Status: AC
  Administered 2016-03-17: 1000 mL via INTRAVENOUS

## 2016-03-17 MED ORDER — INSULIN ASPART 100 UNIT/ML ~~LOC~~ SOLN
10.0000 [IU] | Freq: Once | SUBCUTANEOUS | Status: AC
  Administered 2016-03-17: 10 [IU] via INTRAVENOUS
  Filled 2016-03-17: qty 1

## 2016-03-17 MED ORDER — PIPERACILLIN-TAZOBACTAM 3.375 G IVPB
3.3750 g | Freq: Once | INTRAVENOUS | Status: AC
Start: 2016-03-17 — End: 2016-03-17
  Administered 2016-03-17: 3.375 g via INTRAVENOUS
  Filled 2016-03-17: qty 50

## 2016-03-17 MED ORDER — SODIUM CHLORIDE 0.9 % IV SOLN
INTRAVENOUS | Status: DC
  Administered 2016-03-17: 19:00:00 via INTRAVENOUS

## 2016-03-17 MED ORDER — DEXTROSE-NACL 5-0.45 % IV SOLN
INTRAVENOUS | Status: DC
  Administered 2016-03-17: via INTRAVENOUS

## 2016-03-17 MED ORDER — DEXTROSE-NACL 5-0.45 % IV SOLN
INTRAVENOUS | Status: DC
Start: 2016-03-17 — End: 2016-03-17

## 2016-03-17 MED ORDER — STERILE WATER FOR INJECTION IV SOLN: INTRAVENOUS | Status: DC

## 2016-03-17 MED ORDER — STERILE WATER FOR INJECTION IV SOLN
INTRAVENOUS | Status: DC
  Administered 2016-03-17 – 2016-03-18 (×2): via INTRAVENOUS
  Filled 2016-03-17 (×6): qty 925

## 2016-03-17 MED ORDER — SODIUM CHLORIDE 0.9 % IV BOLUS (SEPSIS)
1000.0000 mL | Freq: Once | INTRAVENOUS | Status: AC
Start: 2016-03-17 — End: 2016-03-17
  Administered 2016-03-17: 1000 mL via INTRAVENOUS

## 2016-03-17 MED ORDER — SODIUM CHLORIDE 0.9 % IV SOLN
INTRAVENOUS | Status: DC
  Administered 2016-03-17: 5.4 [IU]/h via INTRAVENOUS
  Administered 2016-03-17: 10.8 [IU]/h via INTRAVENOUS
  Filled 2016-03-17: qty 2.5

## 2016-03-17 MED ORDER — LIDOCAINE HCL 1 % IJ SOLN
INTRAMUSCULAR | Status: AC
  Administered 2016-03-17: 20 mL
  Filled 2016-03-17: qty 20

## 2016-03-17 MED ORDER — INSULIN REGULAR HUMAN 100 UNIT/ML IJ SOLN: INTRAMUSCULAR | Status: DC

## 2016-03-17 MED ORDER — SODIUM CHLORIDE 0.9 % IV SOLN
INTRAVENOUS | Status: DC
  Administered 2016-03-17 (×2): via INTRAVENOUS

## 2016-03-17 MED ORDER — SODIUM CHLORIDE 0.9 % IV SOLN
1.0000 g | Freq: Once | INTRAVENOUS | Status: AC
  Administered 2016-03-17: 1 g via INTRAVENOUS
  Filled 2016-03-17: qty 10

## 2016-03-17 MED ORDER — HEPARIN SODIUM (PORCINE) 5000 UNIT/ML IJ SOLN: 5000.0000 [IU] | Freq: Three times a day (TID) | INTRAMUSCULAR | Status: DC

## 2016-03-17 MED ORDER — SODIUM CHLORIDE 0.9 % IV BOLUS (SEPSIS): 1000.0000 mL | Freq: Once | INTRAVENOUS | Status: DC

## 2016-03-17 MED ORDER — PIPERACILLIN-TAZOBACTAM 3.375 G IVPB
3.3750 g | Freq: Three times a day (TID) | INTRAVENOUS | Status: DC
  Administered 2016-03-17 – 2016-03-18 (×2): 3.375 g via INTRAVENOUS
  Filled 2016-03-17 (×2): qty 50

## 2016-03-17 MED ORDER — VANCOMYCIN HCL 500 MG IV SOLR
500.0000 mg | Freq: Two times a day (BID) | INTRAVENOUS | Status: DC
  Administered 2016-03-17 – 2016-03-18 (×2): 500 mg via INTRAVENOUS
  Filled 2016-03-17 (×3): qty 500

## 2016-03-17 MED ORDER — POTASSIUM CHLORIDE 10 MEQ/100ML IV SOLN: 10.0000 meq | INTRAVENOUS | Status: DC

## 2016-03-17 MED ORDER — INSULIN REGULAR HUMAN 100 UNIT/ML IJ SOLN
INTRAMUSCULAR | Status: DC
  Filled 2016-03-17: qty 2.5

## 2016-03-17 MED ORDER — SODIUM CHLORIDE 0.9 % IV SOLN: 250.0000 mL | INTRAVENOUS | Status: DC | PRN

## 2016-03-17 NOTE — Procedures (Signed)
Central Venous Catheter Insertion Procedure Note Madison [name] [id number] 1961/01/03  Procedure: Insertion of Central Venous Catheter Indications: Drug and/or fluid administration  Procedure Details Consent: Risks of procedure as well as the alternatives and risks of each were explained to the (patient/caregiver).  Consent for procedure obtained. Time Out: Verified patient identification, verified procedure, site/side was marked, verified correct patient position, special equipment/implants available, medications/allergies/relevent history reviewed, required imaging and test results available.  Performed  Maximum sterile technique was used including antiseptics, cap, gloves, gown, hand hygiene, mask and sheet. Skin prep: Chlorhexidine; local anesthetic administered A antimicrobial bonded/coated triple lumen catheter was placed in the right internal jugular vein using the Seldinger technique.  Evaluation Blood flow good Complications: No apparent complications Patient did tolerate procedure well. Chest X-ray ordered to verify placement.  CXR: pending.  Brad Lieurance 03/17/2016, 5:51 PM

## 2016-03-17 NOTE — ED Notes (Signed)
Lab phones K+ of 7.1 and glucose of 1187 which I relay to her nurse, Marylee Floras.

## 2016-03-17 NOTE — ED Notes (Signed)
Attempted IV venipuncture x1 and was unsuccessful. Will have another nurse to attempt.

## 2016-03-17 NOTE — ED Provider Notes (Signed)
CSN: [id number]     Arrival date & time 03/17/16  20 History   First MD Initiated Contact with Patient 03/17/16 1358     Chief Complaint  Patient presents with  . Hyperglycemia     (Consider location/radiation/quality/duration/timing/severity/associated sxs/prior Treatment) Patient is a 55 y.o. female presenting with hyperglycemia.  Hyperglycemia Blood sugar level PTA:  "high" Severity:  Severe Duration:  2 days Progression:  Worsening Chronicity:  Recurrent Context: noncompliance (has not taken insulin for 2 days)   Associated symptoms: dehydration, fatigue, increased thirst, malaise, nausea, polyuria, shortness of breath and vomiting (greater than 10 times per pt)   Associated symptoms: no abdominal pain, no chest pain, no dysuria, no fever and no weakness   Risk factors: hx of DKA     Past Medical History  Diagnosis Date  . CAD (coronary artery disease)   . Obesity   . Hypercholesteremia   . HTN (hypertension)   . Dyslipidemia   . Aortic stenosis   . Thrombophlebitis   . Heart murmur   . Myocardial infarction ([place]) 12/2014  . Pneumonia 11/2014; 12/2014  . GERD (gastroesophageal reflux disease)   . Stroke syndrome [facility]) 1995; 2001    "when my son was born; problems w/speech and L hand since then" (01/19/2015)  . Anxiety   . Depression   . Allergy   . CHF (congestive heart failure) ([place])   . Rheumatoid arthritis(714.0)     "hands" (07/01/2015)  . Diabetes ([place]) dx'd 1982    "went straight to insulin"   Past Surgical History  Procedure Laterality Date  . Cesarean section  1995  . Foot fracture surgery    . Knee arthroscopy Right   . Neuroplasty / transposition median nerve at carpal tunnel    . Left heart catheterization with coronary angiogram N/A 12/08/2014    Procedure: LEFT HEART CATHETERIZATION WITH CORONARY ANGIOGRAM;  Surgeon: Wellington Hampshire, MD;  Location: [place] CATH LAB;  Service: Cardiovascular;  Laterality: N/A;  . Percutaneous coronary stent intervention  (pci-s) N/A 12/09/2014    Procedure: PERCUTANEOUS CORONARY STENT INTERVENTION (PCI-S);  Surgeon: Leonie Man, MD;  Location: [facility] CATH LAB;  Service: Cardiovascular;  Laterality: N/A;  . Cardiac catheterization  12/08/2014  . Coronary angioplasty with stent placement  12/09/2014  . Aortic valve replacement (avr)/coronary artery bypass grafting (cabg)  "2014"    Archie Endo 03/08/2014  . Fracture surgery    . Tubal ligation  1995  . Cataract extraction Left   . Coronary artery bypass graft    . Orif ankle fracture Left 07/04/2015    Procedure: OPEN REDUCTION INTERNAL FIXATION (ORIF)  TRIMAL ANKLE FRACTURE;  Surgeon: Leandrew Koyanagi, MD;  Location: [place];  Service: Orthopedics;  Laterality: Left;   Family History  Problem Relation Age of Onset  . Stroke    . Heart disease Mother   . Heart disease Sister    Social History  Substance Use Topics  . Smoking status: Never Smoker   . Smokeless tobacco: Never Used  . Alcohol Use: No   OB History    No data available     Review of Systems  Constitutional: Positive for fatigue. Negative for fever.  HENT: Negative for sore throat.   Eyes: Negative for visual disturbance.  Respiratory: Positive for shortness of breath. Negative for cough.   Cardiovascular: Negative for chest pain.  Gastrointestinal: Positive for nausea and vomiting (greater than 10 times per pt). Negative for abdominal pain and diarrhea.  Endocrine: Positive for  polydipsia and polyuria.  Genitourinary: Positive for frequency. Negative for dysuria and difficulty urinating.  Musculoskeletal: Negative for back pain and neck pain.  Skin: Negative for rash.  Neurological: Negative for syncope, weakness and headaches.      Allergies  Adhesive; Celebrex; and Detrol  Home Medications   Prior to Admission medications   Medication Sig Start Date End Date Taking? Authorizing Provider  Blood Glucose Monitoring Suppl ([place]) w/Device KIT Use to check glucose 2x per  day 03/16/16  Yes Dorothyann Peng, NP  citalopram (CELEXA) 40 MG tablet Take 1 tablet (40 mg total) by mouth daily. 03/08/16  Yes Dorothyann Peng, NP  glucose blood test strip Use as instructed 03/16/16  Yes Dorothyann Peng, NP  insulin aspart (NOVOLOG) 100 UNIT/ML injection Inject 5 Units into the skin 3 (three) times daily with meals. 02/14/16  Yes Domenic Polite, MD  insulin detemir (LEVEMIR) 100 UNIT/ML injection Inject 0.25 mLs (25 Units total) into the skin daily. 03/13/16  Yes Dorothyann Peng, NP  lisinopril (PRINIVIL,ZESTRIL) 2.5 MG tablet Take 2.5 mg by mouth daily. 01/18/16  Yes Historical Provider, MD  omeprazole (PRILOSEC) 40 MG capsule TAKE 1 CAPSULE (40 MG TOTAL) BY MOUTH DAILY. 09/12/15  Yes Dorothyann Peng, NP  ondansetron (ZOFRAN) 4 MG tablet Take 1 tablet (4 mg total) by mouth every 8 (eight) hours as needed for nausea or vomiting. 02/06/16  Yes Dorothyann Peng, NP  polyethylene glycol (MIRALAX / GLYCOLAX) packet Take 17 g by mouth 2 (two) times daily. 03/11/16  Yes Eugenie Filler, MD  pravastatin (PRAVACHOL) 20 MG tablet TAKE 1 TABLET BY MOUTH EVERY DAY 03/08/16  Yes Dorothyann Peng, NP  traZODone (DESYREL) 50 MG tablet Take 50 mg by mouth at bedtime.  12/17/15  Yes Historical Provider, MD  warfarin (COUMADIN) 5 MG tablet Take 1 tablet (5 mg total) by mouth daily. If okay with coumadin clinic. Patient taking differently: Take 7.5 mg by mouth daily at 6 PM. If okay with coumadin clinic. 03/13/16  Yes Eugenie Filler, MD   BP 102/44 mmHg  Pulse 109  Temp(Src) 98.1 F (36.7 C) (Axillary)  Resp 21  Ht '5\' 3"'  (1.6 m)  Wt 135 lb 5.8 oz (61.4 kg)  BMI 23.98 kg/m2  SpO2 100% Physical Exam  Constitutional: She is oriented to person, place, and time. She appears well-developed and well-nourished. She appears toxic. She has a sickly appearance. She appears ill. No distress.  Patient awake, tachypneic, however ill appearing  HENT:  Head: Normocephalic and atraumatic.  Mouth/Throat: Mucous membranes are  dry.  Eyes: Conjunctivae and EOM are normal. Pupils are unequal (pt reports is baseline and strabismus is baseline per pt).  Neck: Normal range of motion.  Cardiovascular: Regular rhythm, normal heart sounds and intact distal pulses.  Tachycardia present.  Exam reveals no gallop and no friction rub.   No murmur heard. Pulmonary/Chest: Breath sounds normal. Tachypnea noted. She has no wheezes. She has no rales.  Abdominal: Soft. She exhibits no distension. There is no tenderness. There is no guarding.  Musculoskeletal: She exhibits no edema or tenderness.  Neurological: She is alert and oriented to person, place, and time.  Skin: Skin is warm and dry. No rash noted. She is not diaphoretic. No erythema.  Nursing note and vitals reviewed.   ED Course  Procedures (including critical care time) Labs Review Labs Reviewed  BASIC METABOLIC PANEL - Abnormal; Notable for the following:    Sodium 128 (*)    Potassium 7.3 (*)  Chloride 89 (*)    CO2 <7 (*)    Glucose, Bld 1187 (*)    BUN 26 (*)    Creatinine, Ser 1.90 (*)    GFR calc non Af Amer 29 (*)    GFR calc Af Amer 33 (*)    All other components within normal limits  CBC - Abnormal; Notable for the following:    WBC 14.0 (*)    RBC 3.83 (*)    Hemoglobin 10.3 (*)    MCHC 28.3 (*)    RDW 20.6 (*)    Platelets 412 (*)    All other components within normal limits  URINALYSIS, ROUTINE W REFLEX MICROSCOPIC (NOT AT [facility]) - Abnormal; Notable for the following:    Glucose, UA >1000 (*)    Ketones, ur >80 (*)    All other components within normal limits  BLOOD GAS, VENOUS - Abnormal; Notable for the following:    pH, Ven 6.909 (*)    pCO2, Ven 17.2 (*)    pO2, Ven 54.8 (*)    Bicarbonate 3.3 (*)    Acid-base deficit 28.4 (*)    All other components within normal limits  PROTIME-INR - Abnormal; Notable for the following:    Prothrombin Time 35.0 (*)    INR 3.59 (*)    All other components within normal limits  HEPATIC FUNCTION  PANEL - Abnormal; Notable for the following:    Alkaline Phosphatase 149 (*)    Total Bilirubin 1.5 (*)    Indirect Bilirubin 1.4 (*)    All other components within normal limits  URINE MICROSCOPIC-ADD ON - Abnormal; Notable for the following:    Squamous Epithelial / LPF 0-5 (*)    Bacteria, UA FEW (*)    All other components within normal limits  COMPREHENSIVE METABOLIC PANEL - Abnormal; Notable for the following:    CO2 6 (*)    Glucose, Bld 835 (*)    BUN 24 (*)    Creatinine, Ser 1.79 (*)    Calcium 7.9 (*)    Total Protein 5.9 (*)    Albumin 3.1 (*)    Alkaline Phosphatase 128 (*)    Total Bilirubin 1.3 (*)    GFR calc non Af Amer 31 (*)    GFR calc Af Amer 36 (*)    Anion gap 24 (*)    All other components within normal limits  PHOSPHORUS - Abnormal; Notable for the following:    Phosphorus 6.0 (*)    All other components within normal limits  LIPASE, BLOOD - Abnormal; Notable for the following:    Lipase 73 (*)    All other components within normal limits  LACTIC ACID, PLASMA - Abnormal; Notable for the following:    Lactic Acid, Venous 5.7 (*)    All other components within normal limits  CBC WITH DIFFERENTIAL/PLATELET - Abnormal; Notable for the following:    WBC 19.9 (*)    RBC 3.19 (*)    Hemoglobin 8.8 (*)    HCT 28.7 (*)    RDW 19.8 (*)    Neutro Abs 17.1 (*)    All other components within normal limits  PROTIME-INR - Abnormal; Notable for the following:    Prothrombin Time 37.8 (*)    INR 3.97 (*)    All other components within normal limits  GLUCOSE, CAPILLARY - Abnormal; Notable for the following:    Glucose-Capillary >600 (*)    All other components within normal limits  GLUCOSE, CAPILLARY - Abnormal;  Notable for the following:    Glucose-Capillary 477 (*)    All other components within normal limits  CBG MONITORING, ED - Abnormal; Notable for the following:    Glucose-Capillary >600 (*)    All other components within normal limits  CBG  MONITORING, ED - Abnormal; Notable for the following:    Glucose-Capillary >600 (*)    All other components within normal limits  I-STAT CHEM 8, ED - Abnormal; Notable for the following:    Sodium 125 (*)    Potassium 7.0 (*)    Chloride 96 (*)    BUN 28 (*)    Creatinine, Ser 1.30 (*)    Glucose, Bld >700 (*)    All other components within normal limits  I-STAT CG4 LACTIC ACID, ED - Abnormal; Notable for the following:    Lactic Acid, Venous 10.20 (*)    All other components within normal limits  I-STAT CG4 LACTIC ACID, ED - Abnormal; Notable for the following:    Lactic Acid, Venous 6.06 (*)    All other components within normal limits  CBG MONITORING, ED - Abnormal; Notable for the following:    Glucose-Capillary >600 (*)    All other components within normal limits  CULTURE, BLOOD (ROUTINE X 2)  CULTURE, BLOOD (ROUTINE X 2)  URINE CULTURE  CULTURE, RESPIRATORY (NON-EXPECTORATED)  MRSA PCR SCREENING  LIPASE, BLOOD  MAGNESIUM  AMYLASE  TROPONIN I  PROCALCITONIN  BRAIN NATRIURETIC PEPTIDE  APTT  BASIC METABOLIC PANEL  BASIC METABOLIC PANEL  BASIC METABOLIC PANEL  TROPONIN I  TROPONIN I  CORTISOL  URINALYSIS, ROUTINE W REFLEX MICROSCOPIC (NOT AT [facility])  BLOOD GAS, ARTERIAL  CBC  BASIC METABOLIC PANEL  BLOOD GAS, ARTERIAL  MAGNESIUM  PHOSPHORUS  BASIC METABOLIC PANEL  BASIC METABOLIC PANEL  BASIC METABOLIC PANEL  CBC  PROTIME-INR  BASIC METABOLIC PANEL    Imaging Review Dg Chest Port 1 View  03/17/2016  CLINICAL DATA:  Acute respiratory failure EXAM: PORTABLE CHEST 1 VIEW COMPARISON:  Chest radiograph from earlier today. FINDINGS: Sternotomy wires appear aligned and intact. Right internal jugular central venous catheter enters the superior vena cava and curves medially at the tip, suggesting azygous positioning. Stable cardiomediastinal silhouette with normal heart size. No pneumothorax. No pleural effusion. Lungs appear clear, with no acute consolidative airspace  disease and no pulmonary edema. IMPRESSION: 1. Right internal jugular central venous catheter enters the SVC anchors medially at the tip, suggesting azygous positioning. Recommend retracting 2-3 cm. 2. No pneumothorax.  No active cardiopulmonary disease. These results were called by telephone at the time of interpretation on 03/17/2016 at 6:25 pm to Dr. Ashok Cordia, who verbally acknowledged these results. Electronically Signed   By: Ilona Sorrel M.D.   On: 03/17/2016 18:51   Dg Chest Portable 1 View  03/17/2016  CLINICAL DATA:  Vomiting, hyperglycemia, hypertension. History of cardiac catheterization and CABG. EXAM: PORTABLE CHEST 1 VIEW COMPARISON:  Chest x-rays dated 03/09/2016 and 09/16/2015. FINDINGS: Heart size is normal. Overall cardiomediastinal silhouette is stable in size and configuration. Median sternotomy wires appear intact and stable in alignment. Lungs are clear. Lung volumes are normal. No pleural effusion or pneumothorax seen. Osseous structures about the chest are unremarkable. IMPRESSION: No active disease. Electronically Signed   By: Franki Cabot M.D.   On: 03/17/2016 15:25   I have personally reviewed and evaluated these images and lab results as part of my medical decision-making.   EKG Interpretation   Date/Time:  Saturday March 17 2016 15:14:24  EDT Ventricular Rate:  113 PR Interval:  147 QRS Duration: 119 QT Interval:  348 QTC Calculation: 477 R Axis:   -89 Text Interpretation:  Sinus tachycardia Probable anteroseptal infarct, old  Peaked T Waves Confirmed by [facility] MD, [place] ([id number]) on 03/17/2016  5:49:43 PM       Emergency Ultrasound:  US Guidance for needle guidance  Performed by Dr. Billy Fischer Indication: Needs more IV access and blood cx Linear probe used in real-time to visualize location of needle entry through skin. Interpretation: Successful placement in Right arm vein Image not archived electronically.   CRITICAL CARE: DKA, metabolic acidosis,  hyperkalemia Performed by: Alvino Chapel   Total critical care time: 45 minutes  Critical care time was exclusive of separately billable procedures and treating other patients.  Critical care was necessary to treat or prevent imminent or life-threatening deterioration.  Critical care was time spent personally by me on the following activities: development of treatment plan with patient and/or surrogate as well as nursing, discussions with consultants, evaluation of patient's response to treatment, examination of patient, obtaining history from patient or surrogate, ordering and performing treatments and interventions, ordering and review of laboratory studies, ordering and review of radiographic studies, pulse oximetry and re-evaluation of patient's condition.   MDM   Final diagnoses:  Diabetic ketoacidosis without coma associated with type 1 diabetes mellitus (HCC)  Hyperkalemia  Lactic acidosis  Dehydration  Metabolic acidosis  High anion gap metabolic acidosis  Hypotension, unspecified hypotension type   55 year old female with a history of coronary artery disease, hypertension, hyperlipidemia, aortic stenosis, rheumatoid arthritis, congestive heart failure, diabetes with multiple admissions for DKA, presents with concern for hyperglycemia.  Patient tachypneic, however able to answer questions on arrival. Labs are significant for a bicarbonate of less than 7, potassium of 7, glucose of over 1000, pH of 6.9, lactic acid of 10.2.  Concern is for DKA with severe diabetic ketoacidosis, dehydration, and lactic acidosis. Given patient is not tiring at this time, able to answer questions, feel that risks of intubation outweighed benefit. She was initiated on a sodium acetate drip given sodium bicarbonate shortage in the hospital, hyperkalemia, and severe acidemia. EKG showed peaked T waves, and patient was given calcium gluconate. She was given a bolus of insulin followed by insulin  drip for concern of DKA. She was also given 3 L of normal saline for concern for severe dehydration. While I have a low suspicion for sepsis as etiology of these symptoms, vancomycin and Zosyn were given empirically for possible sepsis as contributor given severity of illness. INR supratherapeutic however no sign of bleeding. Pt remains ill appearing however awake with tachypnea on reevaluation.  Blood pressures which were in 80s and 90s on arrival have improved to 110s on my reevaluation.  Repeat labs ordered at time of pt transfer to critical care.   Gareth Morgan, MD 03/17/16 2202

## 2016-03-17 NOTE — Progress Notes (Deleted)
eLink Physician-Brief Progress Note Patient Name: Madison [name] DOB: Apr 04, 1961 MRN: [id number]   Date of Service  03/17/2016  HPI/Events of Note  Camera check on patient at nurse's request. Status post hemodialysis with removal of 2 L of fluid. Patient now off fentanyl drip. Tidal volume approximately 4 L/m.Patient somewhat somnolent and drowsy. Nurse and respiratory therapist at bedside. Continuing to monitor the patient and Hold on extubation extubation until he is more awake.  eICU Interventions  1. Holding on extubation at this time. 2. Continue to hold sedation     Intervention Category Major Interventions: Respiratory failure - evaluation and management  [name] 03/17/2016, 3:44 PM

## 2016-03-17 NOTE — Progress Notes (Signed)
Unable to obtain abg. RT attempted x 2. Passed off in report to oncoming therapist abg attempt was unsuccessful. Pts o2 saturation is 100% on nasal cannula. VBG was collected earlier in shift.

## 2016-03-17 NOTE — ED Notes (Signed)
Lab delay - admitting doctor about to put in central line.

## 2016-03-17 NOTE — ED Notes (Signed)
Bed: SM27 Expected date: 03/17/16 Expected time: 1:03 PM Means of arrival:  Comments: Vomiting, hyperglycemia, hypotensive

## 2016-03-17 NOTE — Procedures (Signed)
Central line repositioned.  Patient prepped and sterilized, draped, 1% lidocaine without Epi administered sub cutaneously.  Central line withdrawn by 3cm. Re-sutured, biopatch and dressing applied. No immediate complications. EBL 0cc Patient tolerated procedure well.  CXR ordered

## 2016-03-17 NOTE — ED Notes (Signed)
Critical ISTAT results reported to EDP.

## 2016-03-17 NOTE — Progress Notes (Signed)
eLink Physician-Brief Progress Note Patient Name: Madison [name] DOB: 1961-10-26 MRN: [id number]   Date of Service  03/17/2016  HPI/Events of Note  Hyperglycemia persists but significant drop in glucose w/ insulin drip rate of 16u/hr.   eICU Interventions  RN instructed to decrease drip rate to 8 u/hr IV     Intervention Category Intermediate Interventions: Hyperglycemia - evaluation and treatment  [name] 03/17/2016, 8:03 PM

## 2016-03-17 NOTE — ED Notes (Signed)
Pt arrived via EMS with report of hyperglycemia, lethargic, n/v x5, polyuria, thirsty, and not taking insulin injections x2 days d/t glucometer broken. [facility]- "HI".

## 2016-03-17 NOTE — Progress Notes (Signed)
Pharmacy Antibiotic Note  Madison [name] is a 55 y.o. female admitted on 03/17/2016 with sepsis. Lactic acid elevated @10 .2. Pharmacy has been consulted for Vanco and Zosyn dosing.  Plan: Vancomycin 500mg  IV every Q12h hours.  Goal trough 15-20 mcg/mL.  Zosyn 3.375 Gm IV Q8h (4 hour infusion)     Temp (24hrs), Avg:98.4 F (36.9 C), Min:98.4 F (36.9 C), Max:98.4 F (36.9 C)   Recent Labs Lab 03/11/16 0503 03/13/16 1404 03/17/16 1345 03/17/16 1448 03/17/16 1449  WBC  --   --  14.0*  --   --   CREATININE 0.54 0.98 1.90* 1.30*  --   LATICACIDVEN  --   --   --   --  10.20*    Estimated Creatinine Clearance: 40.4 mL/min (by C-G formula based on Cr of 1.3).    Allergies  Allergen Reactions  . Adhesive [Tape] Other (See Comments)    Can burn skin if left on too long  . Celebrex [Celecoxib] Rash  . Detrol [Tolterodine] Hives    Antimicrobials this admission: 4/29  Vanco >>   4/29  ZEI>>   Dose adjustments this admission:   Microbiology results: 4/29  BCx: sent    Thank you for allowing pharmacy to be a part of this patient's care.  5/29 03/17/2016 3:13 PM

## 2016-03-17 NOTE — ED Notes (Signed)
Hospitalist and nurse at bedside to insert central line. Will re-check CBG when done.

## 2016-03-17 NOTE — ED Notes (Addendum)
Portable Xray at bedside.

## 2016-03-17 NOTE — H&P (Addendum)
PULMONARY / CRITICAL CARE MEDICINE   Name: Madison [name] MRN: [id number] DOB: [birth year]    ADMISSION DATE:  03/17/2016 CONSULTATION DATE:  03/17/16  REFERRING MD:  ED  CHIEF COMPLAINT:  Altered mental status, DKA  HISTORY OF PRESENT ILLNESS:   54 year old with multiple medical problems including diabetes, coronary artery disease s/p CABG, CHF, hypertension. Last hospital visit was from 4/9/1 7-4/13/17. She's had multiple admissions in the past for DKA secondary to noncompliance and dietary indiscretion. Noted to have a K of 7.0, Cr 1.9, undetectable bicarb, LA of 10. She was started on a insulin drip. PCCM called for admission.  PAST MEDICAL HISTORY :  She  has a past medical history of CAD (coronary artery disease); Obesity; Hypercholesteremia; HTN (hypertension); Dyslipidemia; Aortic stenosis; Thrombophlebitis; Heart murmur; Myocardial infarction ([place]) (12/2014); Pneumonia (11/2014; 12/2014); GERD (gastroesophageal reflux disease); Stroke syndrome ([place]) (1995; 2001); Anxiety; Depression; Allergy; CHF (congestive heart failure) ([place]); Rheumatoid arthritis(714.0); and Diabetes ([place]) (dx'd 1982).  PAST SURGICAL HISTORY: She  has past surgical history that includes Cesarean section (1995); Foot fracture surgery; Knee arthroscopy (Right); Neuroplasty / transposition median nerve at carpal tunnel; left heart catheterization with coronary angiogram (N/A, 12/08/2014); percutaneous coronary stent intervention (pci-s) (N/A, 12/09/2014); Cardiac catheterization (12/08/2014); Coronary angioplasty with stent (12/09/2014); Aortic valve replacement (avr)/coronary artery bypass grafting (cabg) ("2014"); Fracture surgery; Tubal ligation (1995); Cataract extraction (Left); Coronary artery bypass graft; and ORIF ankle fracture (Left, 07/04/2015).  Allergies  Allergen Reactions  . Adhesive [Tape] Other (See Comments)    Can burn skin if left on too long  . Celebrex [Celecoxib] Rash  . Detrol [Tolterodine] Hives     No current facility-administered medications on file prior to encounter.   Current Outpatient Prescriptions on File Prior to Encounter  Medication Sig  . Blood Glucose Monitoring Suppl ([place]) w/Device KIT Use to check glucose 2x per day  . citalopram (CELEXA) 40 MG tablet Take 1 tablet (40 mg total) by mouth daily.  Marland Kitchen glucose blood test strip Use as instructed  . insulin aspart (NOVOLOG) 100 UNIT/ML injection Inject 5 Units into the skin 3 (three) times daily with meals.  . insulin detemir (LEVEMIR) 100 UNIT/ML injection Inject 0.25 mLs (25 Units total) into the skin daily.  Marland Kitchen omeprazole (PRILOSEC) 40 MG capsule TAKE 1 CAPSULE (40 MG TOTAL) BY MOUTH DAILY.  Marland Kitchen ondansetron (ZOFRAN) 4 MG tablet Take 1 tablet (4 mg total) by mouth every 8 (eight) hours as needed for nausea or vomiting.  . polyethylene glycol (MIRALAX / GLYCOLAX) packet Take 17 g by mouth 2 (two) times daily.  . pravastatin (PRAVACHOL) 20 MG tablet TAKE 1 TABLET BY MOUTH EVERY DAY  . traZODone (DESYREL) 50 MG tablet Take 50 mg by mouth at bedtime.   Marland Kitchen warfarin (COUMADIN) 5 MG tablet Take 1 tablet (5 mg total) by mouth daily. If okay with coumadin clinic. (Patient taking differently: Take 7.5 mg by mouth daily at 6 PM. If okay with coumadin clinic.)    FAMILY HISTORY:  Her indicated that her mother is alive. She indicated that her father is deceased. She indicated that all of her five sisters are alive. She indicated that both of her brothers are alive.   SOCIAL HISTORY: She  reports that she has never smoked. She has never used smokeless tobacco. She reports that she does not drink alcohol or use illicit drugs.  REVIEW OF SYSTEMS:   Unable to obtain as patient is altered.  SUBJECTIVE:   VITAL SIGNS: BP  113/52 mmHg  Pulse 119  Temp(Src) 98.4 F (36.9 C) (Oral)  Resp 30  SpO2 100%  HEMODYNAMICS:    VENTILATOR SETTINGS:    INTAKE / OUTPUT:    PHYSICAL EXAMINATION: General:  Elderly woman,  mild distress Neuro:  Moves all four extremities. No focal deificits HEENT:  PERRL, dry mucus membranes Cardiovascular:  RRR, No MRG Lungs:  Clear, No wheeze or crackles Abdomen:  Soft, + BS Musculoskeletal:  Normal tone and bulk Skin:  Intact.  LABS:  BMET  Recent Labs Lab 03/11/16 0503 03/13/16 1404 03/17/16 1345 03/17/16 1448  NA 140 130* 128* 125*  K 4.4 4.5 7.3* 7.0*  CL 109 94* 89* 96*  CO2 23 19 <7*  --   BUN 8 19 26* 28*  CREATININE 0.54 0.98 1.90* 1.30*  GLUCOSE 120* 387* 1187* >700*    Electrolytes  Recent Labs Lab 03/11/16 0503 03/13/16 1404 03/17/16 1345  CALCIUM 8.4* 9.4 9.1    CBC  Recent Labs Lab 03/17/16 1345 03/17/16 1448  WBC 14.0*  --   HGB 10.3* 13.3  HCT 36.4 39.0  PLT 412*  --     Coag's  Recent Labs Lab 03/11/16 0503 03/13/16 03/17/16 1345  INR 3.95* 1.4 3.59*    Sepsis Markers  Recent Labs Lab 03/17/16 1449  LATICACIDVEN 10.20*    ABG No results for input(s): PHART, PCO2ART, PO2ART in the last 168 hours.  Liver Enzymes  Recent Labs Lab 03/17/16 1320  AST 23  ALT 19  ALKPHOS 149*  BILITOT 1.5*  ALBUMIN 3.6    Cardiac Enzymes No results for input(s): TROPONINI, PROBNP in the last 168 hours.  Glucose  Recent Labs Lab 03/10/16 2219 03/11/16 0245 03/11/16 0755 03/11/16 1246 03/17/16 1318 03/17/16 1530  GLUCAP 107* 71 151* 231* >600* >600*    Imaging Dg Chest Portable 1 View  03/17/2016  CLINICAL DATA:  Vomiting, hyperglycemia, hypertension. History of cardiac catheterization and CABG. EXAM: PORTABLE CHEST 1 VIEW COMPARISON:  Chest x-rays dated 03/09/2016 and 09/16/2015. FINDINGS: Heart size is normal. Overall cardiomediastinal silhouette is stable in size and configuration. Median sternotomy wires appear intact and stable in alignment. Lungs are clear. Lung volumes are normal. No pleural effusion or pneumothorax seen. Osseous structures about the chest are unremarkable. IMPRESSION: No active  disease. Electronically Signed   By: Franki Cabot M.D.   On: 03/17/2016 15:25    STUDIES:  CXR 4/29 > No acute cardiopulmonary abnormalities  CULTURES: Bcx 4/29 >   ANTIBIOTICS: Vanco 4/29 > Zosyn 4/29>  SIGNIFICANT EVENTS:  LINES/TUBES:   DISCUSSION: 55 Y/O with brittle DM, non compliance admitted with DKA, AKI, lactic acidosis. Noted to be in AKI with hyperkalemia. No clear evidence of infection but elevated LA and WBC count are concerning.   ASSESSMENT / PLAN:  PULMONARY A: Able to protect airway for now P:   Supplemental O2. Monitor for intubation need. Repeat CXR in AM.  CARDIOVASCULAR A:  Tachycardia From DKA, dehydration Mechanical AV P:  Follow troponins, LA Hold coumadin for elevated INR.  RENAL A:   AKI Hyperkalemia P:   Monitor after resuscitation. Anticipate that K will come down with insulin. Follow BMP. Sodium acetate drip  GASTROINTESTINAL A:   Mild elevation in LFTs P:   Keep NPO Repeat LFTs in AM  HEMATOLOGIC A:   Leukocytosis > from infection vs stress P:  Monitor  INFECTIOUS A:   + UA Possible sepsis P:   Cover with vanco, zosyn Procalcitonin protocol  ENDOCRINE A:  Brittle DM DKA   P:   DKA protocol  NEUROLOGIC A:   Altered mental status Likely from acidosis, DKA P:   Consider head CT if mental status does not improve.  FAMILY  - Updates: No family at bedside - Inter-disciplinary family meet or Palliative Care meeting due by: 03/24/16  Critical care time- 35 mins.  Marshell Garfinkel MD [facility] Pager [phone] If no answer or after 3pm call: [phone] 03/17/2016, 4:49 PM

## 2016-03-18 ENCOUNTER — Other Ambulatory Visit: Payer: Self-pay | Admitting: Adult Health

## 2016-03-18 DIAGNOSIS — E1010 Type 1 diabetes mellitus with ketoacidosis without coma: Principal | ICD-10-CM

## 2016-03-18 LAB — GLUCOSE, CAPILLARY
GLUCOSE-CAPILLARY: 204 mg/dL — AB (ref 65–99)
GLUCOSE-CAPILLARY: 137 mg/dL — AB (ref 65–99)
GLUCOSE-CAPILLARY: 117 mg/dL — AB (ref 65–99)
GLUCOSE-CAPILLARY: 178 mg/dL — AB (ref 65–99)
GLUCOSE-CAPILLARY: 186 mg/dL — AB (ref 65–99)
GLUCOSE-CAPILLARY: 146 mg/dL — AB (ref 65–99)
GLUCOSE-CAPILLARY: 64 mg/dL — AB (ref 65–99)
Glucose-Capillary: 131 mg/dL — ABNORMAL HIGH (ref 65–99)
Glucose-Capillary: 133 mg/dL — ABNORMAL HIGH (ref 65–99)
Glucose-Capillary: 141 mg/dL — ABNORMAL HIGH (ref 65–99)
Glucose-Capillary: 144 mg/dL — ABNORMAL HIGH (ref 65–99)
Glucose-Capillary: 145 mg/dL — ABNORMAL HIGH (ref 65–99)
Glucose-Capillary: 159 mg/dL — ABNORMAL HIGH (ref 65–99)
Glucose-Capillary: 187 mg/dL — ABNORMAL HIGH (ref 65–99)
Glucose-Capillary: 192 mg/dL — ABNORMAL HIGH (ref 65–99)
Glucose-Capillary: 63 mg/dL — ABNORMAL LOW (ref 65–99)
Glucose-Capillary: 83 mg/dL (ref 65–99)

## 2016-03-18 LAB — CBC
HEMATOCRIT: 24.2 % — AB (ref 36.0–46.0)
Hemoglobin: 8.2 g/dL — ABNORMAL LOW (ref 12.0–15.0)
MCH: 27.9 pg (ref 26.0–34.0)
MCHC: 33.9 g/dL (ref 30.0–36.0)
MCV: 82.3 fL (ref 78.0–100.0)
Platelets: 257 10*3/uL (ref 150–400)
RBC: 2.94 MIL/uL — AB (ref 3.87–5.11)
RDW: 19.3 % — AB (ref 11.5–15.5)
WBC: 14.9 10*3/uL — AB (ref 4.0–10.5)

## 2016-03-18 LAB — BASIC METABOLIC PANEL
ANION GAP: 10 (ref 5–15)
ANION GAP: 13 (ref 5–15)
Anion gap: 11 (ref 5–15)
BUN: 17 mg/dL (ref 6–20)
BUN: 15 mg/dL (ref 6–20)
BUN: 9 mg/dL (ref 6–20)
CALCIUM: 7.9 mg/dL — AB (ref 8.9–10.3)
CALCIUM: 7.5 mg/dL — AB (ref 8.9–10.3)
CO2: 17 mmol/L — ABNORMAL LOW (ref 22–32)
CO2: 19 mmol/L — ABNORMAL LOW (ref 22–32)
CO2: 25 mmol/L (ref 22–32)
Calcium: 7.9 mg/dL — ABNORMAL LOW (ref 8.9–10.3)
Chloride: 117 mmol/L — ABNORMAL HIGH (ref 101–111)
Chloride: 108 mmol/L (ref 101–111)
Chloride: 106 mmol/L (ref 101–111)
Creatinine, Ser: 1.03 mg/dL — ABNORMAL HIGH (ref 0.44–1.00)
Creatinine, Ser: 0.89 mg/dL (ref 0.44–1.00)
Creatinine, Ser: 0.71 mg/dL (ref 0.44–1.00)
GFR calc Af Amer: >60 mL/min (ref >60)
GFR calc non Af Amer: >60 mL/min (ref >60)
GFR calc non Af Amer: >60 mL/min (ref >60)
Glucose, Bld: 140 mg/dL — ABNORMAL HIGH (ref 65–99)
Glucose, Bld: 152 mg/dL — ABNORMAL HIGH (ref 65–99)
Glucose, Bld: 114 mg/dL — ABNORMAL HIGH (ref 65–99)
Potassium: 4.4 mmol/L (ref 3.5–5.1)
Potassium: 3.8 mmol/L (ref 3.5–5.1)
Potassium: 3.1 mmol/L — ABNORMAL LOW (ref 3.5–5.1)
Sodium: 144 mmol/L (ref 135–145)
Sodium: 140 mmol/L (ref 135–145)
Sodium: 142 mmol/L (ref 135–145)

## 2016-03-18 LAB — URINALYSIS, ROUTINE W REFLEX MICROSCOPIC
Bilirubin Urine: NEGATIVE
Glucose, UA: >1000 mg/dL — AB
HGB URINE DIPSTICK: NEGATIVE
Ketones, ur: >80 mg/dL — AB
Leukocytes, UA: NEGATIVE
Nitrite: NEGATIVE
PH: 5 (ref 5.0–8.0)
Protein, ur: >300 mg/dL — AB
SPECIFIC GRAVITY, URINE: 1.02 (ref 1.005–1.030)

## 2016-03-18 LAB — URINE MICROSCOPIC-ADD ON
RBC / HPF: NONE SEEN RBC/hpf (ref 0–5)
WBC UA: NONE SEEN WBC/hpf (ref 0–5)

## 2016-03-18 LAB — TROPONIN I
Troponin I: 0.09 ng/mL — ABNORMAL HIGH (ref <0.031)
Troponin I: 0.14 ng/mL — ABNORMAL HIGH (ref <0.031)
Troponin I: 0.12 ng/mL — ABNORMAL HIGH (ref <0.031)

## 2016-03-18 LAB — CORTISOL: Cortisol, Plasma: 41 ug/dL

## 2016-03-18 LAB — MAGNESIUM: Magnesium: 1.4 mg/dL — ABNORMAL LOW (ref 1.7–2.4)

## 2016-03-18 LAB — PHOSPHORUS: Phosphorus: 2 mg/dL — ABNORMAL LOW (ref 2.5–4.6)

## 2016-03-18 LAB — PROTIME-INR
INR: 5.61 — AB (ref 0.00–1.49)
Prothrombin Time: 49 seconds — ABNORMAL HIGH (ref 11.6–15.2)

## 2016-03-18 MED ORDER — INSULIN ASPART 100 UNIT/ML ~~LOC~~ SOLN
5.0000 [IU] | Freq: Three times a day (TID) | SUBCUTANEOUS | Status: DC
  Administered 2016-03-18 – 2016-03-20 (×4): 5 [IU] via SUBCUTANEOUS

## 2016-03-18 MED ORDER — INSULIN DETEMIR 100 UNIT/ML ~~LOC~~ SOLN: 20.0000 [IU] | Freq: Every day | SUBCUTANEOUS | Status: DC

## 2016-03-18 MED ORDER — SODIUM CHLORIDE 0.9 % IV SOLN
INTRAVENOUS | Status: DC
  Administered 2016-03-18: 09:00:00 via INTRAVENOUS

## 2016-03-18 MED ORDER — FENTANYL CITRATE (PF) 100 MCG/2ML IJ SOLN
25.0000 ug | Freq: Once | INTRAMUSCULAR | Status: AC
  Administered 2016-03-18: 25 ug via INTRAVENOUS
  Filled 2016-03-18: qty 2

## 2016-03-18 MED ORDER — INSULIN DETEMIR 100 UNIT/ML ~~LOC~~ SOLN
20.0000 [IU] | Freq: Every day | SUBCUTANEOUS | Status: DC
  Administered 2016-03-18 – 2016-03-20 (×3): 20 [IU] via SUBCUTANEOUS
  Filled 2016-03-18 (×3): qty 0.2

## 2016-03-18 MED ORDER — POTASSIUM CHLORIDE CRYS ER 20 MEQ PO TBCR
20.0000 meq | EXTENDED_RELEASE_TABLET | Freq: Once | ORAL | Status: AC
  Administered 2016-03-18: 20 meq via ORAL
  Filled 2016-03-18: qty 1

## 2016-03-18 MED ORDER — MAGNESIUM SULFATE 2 GM/50ML IV SOLN
2.0000 g | Freq: Once | INTRAVENOUS | Status: AC
  Administered 2016-03-18: 2 g via INTRAVENOUS
  Filled 2016-03-18: qty 50

## 2016-03-18 MED ORDER — INSULIN ASPART 100 UNIT/ML ~~LOC~~ SOLN
0.0000 [IU] | Freq: Three times a day (TID) | SUBCUTANEOUS | Status: DC
  Administered 2016-03-18: 2 [IU] via SUBCUTANEOUS
  Administered 2016-03-19: 3 [IU] via SUBCUTANEOUS
  Administered 2016-03-19 – 2016-03-20 (×2): 5 [IU] via SUBCUTANEOUS

## 2016-03-18 MED ORDER — K PHOS MONO-SOD PHOS DI & MONO 155-852-130 MG PO TABS
500.0000 mg | ORAL_TABLET | Freq: Three times a day (TID) | ORAL | Status: AC
  Administered 2016-03-18 – 2016-03-19 (×4): 500 mg via ORAL
  Filled 2016-03-18 (×4): qty 2

## 2016-03-18 NOTE — Progress Notes (Signed)
Called [facility] and spoke with Vinnie Langton, [employer] fundraiser. Morning labs couldn't be drawn. Three phlebotomists attempted to draw am labs. Only troponin was able to be collected. Patient has refused lab draw foot sticks. Was told by phlebotomy that another attempt would be made this am to collect labs.

## 2016-03-18 NOTE — Progress Notes (Signed)
Called and spoke left message with [facility] RN to notify [facility] physician of patient's x-ray results after central line was repositioned. Was notified by Dr.Deterding not to use central line at this time.

## 2016-03-18 NOTE — Progress Notes (Signed)
CRITICAL VALUE ALERT  Critical value received:  INR 5.61  Date of notification:  03/18/2016  Time of notification:  10:50  Critical value read back: yes  Nurse who received alert:  Lorrin Jackson RN  MD notified (1st page):  Dr. Kendrick Fries  Time of first page:  10:50  MD notified (2nd page):n/a  Time of second page:n/a  Responding MD:  Dr. Kendrick Fries  Time MD responded: 10:51

## 2016-03-18 NOTE — Progress Notes (Signed)
PULMONARY / CRITICAL CARE MEDICINE   Name: Madison [name] MRN: [id number] DOB: [birth year]    ADMISSION DATE:  03/17/2016 CONSULTATION DATE:  03/17/16  REFERRING MD:  ED  CHIEF COMPLAINT:  Altered mental status, DKA  BRIEF:   55 year old with multiple medical problems including diabetes, coronary artery disease s/p CABG, CHF, hypertension. Last hospital visit was from 4/9/1 7-4/13/17. She's had multiple admissions in the past for DKA secondary to noncompliance and dietary indiscretion. Noted to have a K of 7.0, Cr 1.9, undetectable bicarb, LA of 10. She was started on a insulin drip. PCCM called for admission.  SUBJECTIVE:   VITAL SIGNS: BP 119/47 mmHg  Pulse 95  Temp(Src) 98.3 F (36.8 C) (Axillary)  Resp 18  Ht 5\' 3"  (1.6 m)  Wt 65.5 kg (144 lb 6.4 oz)  BMI 25.59 kg/m2  SpO2 99%  HEMODYNAMICS:    VENTILATOR SETTINGS:    INTAKE / OUTPUT: I/O last 3 completed shifts: In: 2589.5 [I.V.:2439.5; IV Piggyback:150] Out: 2600 [Urine:2600]  PHYSICAL EXAMINATION:  Gen: well appearing HENT: NCAT OP clear PULM: CTA B CV: RRR, systolic murmur GI: BS+, soft, non-tender MSK: normal bulk and tone Neuro: A&Ox4, MAEW  LABS:  BMET  Recent Labs Lab 03/17/16 2204 03/18/16 0129 03/18/16 0538  NA 141 144 140  K 5.5* 4.4 3.8  CL 115* 117* 108  CO2 12* 17* 19*  BUN 20 17 15   CREATININE 1.36* 1.03* 0.89  GLUCOSE 300* 140* 152*    Electrolytes  Recent Labs Lab 03/17/16 1801 03/17/16 2204 03/18/16 0129 03/18/16 0538  CALCIUM 7.9* 8.0* 7.9* 7.5*  MG 2.1  --   --   --   PHOS 6.0*  --   --   --     CBC  Recent Labs Lab 03/17/16 1345 03/17/16 1448 03/17/16 1801  WBC 14.0*  --  19.9*  HGB 10.3* 13.3 8.8*  HCT 36.4 39.0 28.7*  PLT 412*  --  354    Coag's  Recent Labs Lab 03/13/16 03/17/16 1345 03/17/16 1801  APTT  --   --  37  INR 1.4 3.59* 3.97*    Sepsis Markers  Recent Labs Lab 03/17/16 1449 03/17/16 1801 03/17/16 1812  LATICACIDVEN 10.20*  5.7* 6.06*  PROCALCITON  --  2.40  --     ABG No results for input(s): PHART, PCO2ART, PO2ART in the last 168 hours.  Liver Enzymes  Recent Labs Lab 03/17/16 1320 03/17/16 1801  AST 23 21  ALT 19 17  ALKPHOS 149* 128*  BILITOT 1.5* 1.3*  ALBUMIN 3.6 3.1*    Cardiac Enzymes  Recent Labs Lab 03/17/16 1801 03/17/16 2326 03/18/16 0538  TROPONINI 0.03 0.09* 0.14*    Glucose  Recent Labs Lab 03/18/16 0125 03/18/16 0228 03/18/16 0322 03/18/16 0417 03/18/16 0519 03/18/16 0727  GLUCAP 131* 141* 137* 117* 144* 187*    Imaging Dg Chest Port 1 View  03/17/2016  CLINICAL DATA:  Status post right IJ line repositioning. Initial encounter. EXAM: PORTABLE CHEST 1 VIEW COMPARISON:  Chest radiograph performed earlier today at 5:59 p.m. FINDINGS: The lungs are well-aerated and clear. There is no evidence of focal opacification, pleural effusion or pneumothorax. The cardiomediastinal silhouette is normal in size; the patient is status post median sternotomy. No acute osseous abnormalities are seen. A right IJ line is noted again with its tip slightly curved, suggesting azygos positioning, though it has been retracted since the prior study. This could be retracted another 2 cm. IMPRESSION: 1. Right IJ line  noted with its tip slightly curved, suggesting azygos positioning, though it has been retracted since the prior study. This could be retracted another 2 cm. 2. No acute cardiopulmonary process seen. These results were called by telephone at the time of interpretation on 03/17/2016 at 10:18 pm to [employer] RN at the [facility], who verbally acknowledged these results. Electronically Signed   By: Roanna Raider M.D.   On: 03/17/2016 22:22   Dg Chest Port 1 View  03/17/2016  CLINICAL DATA:  Acute respiratory failure EXAM: PORTABLE CHEST 1 VIEW COMPARISON:  Chest radiograph from earlier today. FINDINGS: Sternotomy wires appear aligned and intact. Right internal jugular central  venous catheter enters the superior vena cava and curves medially at the tip, suggesting azygous positioning. Stable cardiomediastinal silhouette with normal heart size. No pneumothorax. No pleural effusion. Lungs appear clear, with no acute consolidative airspace disease and no pulmonary edema. IMPRESSION: 1. Right internal jugular central venous catheter enters the SVC anchors medially at the tip, suggesting azygous positioning. Recommend retracting 2-3 cm. 2. No pneumothorax.  No active cardiopulmonary disease. These results were called by telephone at the time of interpretation on 03/17/2016 at 6:25 pm to Dr. Jamison Neighbor, who verbally acknowledged these results. Electronically Signed   By: Delbert Phenix M.D.   On: 03/17/2016 18:51   Dg Chest Portable 1 View  03/17/2016  CLINICAL DATA:  Vomiting, hyperglycemia, hypertension. History of cardiac catheterization and CABG. EXAM: PORTABLE CHEST 1 VIEW COMPARISON:  Chest x-rays dated 03/09/2016 and 09/16/2015. FINDINGS: Heart size is normal. Overall cardiomediastinal silhouette is stable in size and configuration. Median sternotomy wires appear intact and stable in alignment. Lungs are clear. Lung volumes are normal. No pleural effusion or pneumothorax seen. Osseous structures about the chest are unremarkable. IMPRESSION: No active disease. Electronically Signed   By: Bary Richard M.D.   On: 03/17/2016 15:25    STUDIES:  CXR 4/29 > No acute cardiopulmonary abnormalities  CULTURES: Bcx 4/29 >   ANTIBIOTICS: Vanco 4/29 > 4/30  Zosyn 4/29> 4/30   SIGNIFICANT EVENTS:  LINES/TUBES:   DISCUSSION: 55 Y/O with brittle DM, non compliance admitted with DKA, AKI, lactic acidosis. > all improved 4/30 AM   ASSESSMENT / PLAN:  PULMONARY A: No acute issues P:   Supplemental O2   CARDIOVASCULAR A:  Tachycardia > resolved Mechanical AV P:  Follow troponins, LA Restart warfarin  RENAL A:   AKI > resolved P:   Continue gentle IVF Monitor BMET and  UOP Replace electrolytes as needed  GASTROINTESTINAL A:   Mild elevation in LFTs P:   Carb modified diet   HEMATOLOGIC A:   Leukocytosis > no evidence of infection P:  Monitor   INFECTIOUS A:   No evidence of infection P:   Stop antibiotics  ENDOCRINE A:   Brittle DM DKA   P:   DKA protocol > transition to home regimen  NEUROLOGIC A:   Altered mental status > resolved  P:   monitor  FAMILY  - Updates: No family at bedside - Inter-disciplinary family meet or Palliative Care meeting due by: 03/24/16    Heber [place], MD [facility] Pager: [phone] Cell: [phone] After 3pm or if no response, call [phone]   03/18/2016, 8:20 AM

## 2016-03-18 NOTE — Progress Notes (Signed)
eLink Physician-Brief Progress Note Patient Name: Madison [name] DOB: 1961/04/02 MRN: [id number]   Date of Service  03/18/2016  HPI/Events of Note  Clinically stable  eICU Interventions  Transfer to floor     Intervention Category Minor Interventions: Routine modifications to care plan (e.g. PRN medications for pain, fever)  Max Fickle 03/18/2016, 3:17 PM

## 2016-03-18 NOTE — Progress Notes (Signed)
eLink Physician-Brief Progress Note Patient Name: Madison [name] DOB: Jul 29, 1961 MRN: [id number]   Date of Service  03/18/2016  HPI/Events of Note  Neck pain where CVL placed.  eICU Interventions  Fentanyl 25 mcg IV times one dose now     Intervention Category Intermediate Interventions: Pain - evaluation and management  DETERDING,ELIZABETH 03/18/2016, 12:21 AM

## 2016-03-18 NOTE — Progress Notes (Signed)
ANTICOAGULATION CONSULT NOTE - Initial Consult  Pharmacy Consult for Warfarin Indication: Mechanical AVR  Allergies  Allergen Reactions  . Adhesive [Tape] Other (See Comments)    Can burn skin if left on too long  . Celebrex [Celecoxib] Rash  . Detrol [Tolterodine] Hives    Patient Measurements: Height: 5\' 3"  (160 cm) Weight: 144 lb 6.4 oz (65.5 kg) IBW/kg (Calculated) : 52.4  Vital Signs: Temp: 98.3 F (36.8 C) (04/30 0000) BP: 100/36 mmHg (04/30 1000) Pulse Rate: 94 (04/30 1000)  Labs:  Recent Labs  03/17/16 1345 03/17/16 1448  03/17/16 1801 03/17/16 2204 03/17/16 2326 03/18/16 0129 03/18/16 0538 03/18/16 0840  HGB 10.3* 13.3  --  8.8*  --   --   --   --  8.2*  HCT 36.4 39.0  --  28.7*  --   --   --   --  24.2*  PLT 412*  --   --  354  --   --   --   --  257  APTT  --   --   --  37  --   --   --   --   --   LABPROT 35.0*  --   --  37.8*  --   --   --   --  49.0*  INR 3.59*  --   --  3.97*  --   --   --   --  5.61*  CREATININE 1.90* 1.30*  --  1.79* 1.36*  --  1.03* 0.89  --   TROPONINI  --   --   < > 0.03  --  0.09*  --  0.14* 0.12*  < > = values in this interval not displayed.  Estimated Creatinine Clearance: 64.9 mL/min (by C-G formula based on Cr of 0.89).   Medical History: Past Medical History  Diagnosis Date  . CAD (coronary artery disease)   . Obesity   . Hypercholesteremia   . HTN (hypertension)   . Dyslipidemia   . Aortic stenosis   . Thrombophlebitis   . Heart murmur   . Myocardial infarction ([facility]) 12/2014  . Pneumonia 11/2014; 12/2014  . GERD (gastroesophageal reflux disease)   . Stroke syndrome [facility]) 1995; 2001    "when my son was born; problems w/speech and L hand since then" (01/19/2015)  . Anxiety   . Depression   . Allergy   . CHF (congestive heart failure) ([facility])   . Rheumatoid arthritis(714.0)     "hands" (07/01/2015)  . Diabetes ([facility]) dx'd 1982    "went straight to insulin"    Medications:  Scheduled:  . insulin aspart  0-15  Units Subcutaneous TID WC  . insulin aspart  5 Units Subcutaneous TID WC  . insulin detemir  20 Units Subcutaneous Daily  . magnesium sulfate 1 - 4 g bolus IVPB  2 g Intravenous Once  . phosphorus  500 mg Oral TID  . sodium chloride  1,000 mL Intravenous Once   Infusions:  . sodium chloride 50 mL/hr at 03/18/16 0903  . sterile water 925 mL with sodium acetate 150 mEq infusion 125 mL/hr at 03/18/16 0107   PRN: sodium chloride  Assessment: 55 year old female admitted 4/29 with altered mental status and DKA.  She takes chronic warfarin 7.5mg  daily for AVR (INR goal 2.5-3.5 per anticoag clinic). INR was supratherapeutic on admission.  Pharmacy is consulted to manage dosing while inpatient.  Today, 03/18/2016  INR: elevated at 5.61 and rising  Hgb:  decreased to 8.2, Plts WNL. No bleeding documented  Drug interactions: none  Diet: advance to carb modified 4/30  Goal of Therapy:  INR 2.5-3.5 per clinic notes   Plan:   Hold warfarin  Daily PT/INR  Monitor for signs/symptoms of bleeding  Loralee Pacas, PharmD, BCPS Pager: [phone] 03/18/2016,11:06 AM

## 2016-03-19 DIAGNOSIS — N179 Acute kidney failure, unspecified: Secondary | ICD-10-CM

## 2016-03-19 DIAGNOSIS — G9340 Encephalopathy, unspecified: Secondary | ICD-10-CM

## 2016-03-19 DIAGNOSIS — Z954 Presence of other heart-valve replacement: Secondary | ICD-10-CM

## 2016-03-19 DIAGNOSIS — I5022 Chronic systolic (congestive) heart failure: Secondary | ICD-10-CM

## 2016-03-19 DIAGNOSIS — E1011 Type 1 diabetes mellitus with ketoacidosis with coma: Secondary | ICD-10-CM

## 2016-03-19 LAB — GLUCOSE, CAPILLARY
GLUCOSE-CAPILLARY: 150 mg/dL — AB (ref 65–99)
GLUCOSE-CAPILLARY: 44 mg/dL — AB (ref 65–99)
GLUCOSE-CAPILLARY: 65 mg/dL (ref 65–99)
Glucose-Capillary: 229 mg/dL — ABNORMAL HIGH (ref 65–99)
Glucose-Capillary: 58 mg/dL — ABNORMAL LOW (ref 65–99)
Glucose-Capillary: 82 mg/dL (ref 65–99)

## 2016-03-19 LAB — BASIC METABOLIC PANEL
ANION GAP: 8 (ref 5–15)
BUN: 5 mg/dL — AB (ref 6–20)
CHLORIDE: 108 mmol/L (ref 101–111)
CO2: 28 mmol/L (ref 22–32)
Calcium: 7.7 mg/dL — ABNORMAL LOW (ref 8.9–10.3)
Creatinine, Ser: 0.55 mg/dL (ref 0.44–1.00)
GFR calc Af Amer: >60 mL/min (ref >60)
Glucose, Bld: 110 mg/dL — ABNORMAL HIGH (ref 65–99)
POTASSIUM: 3.3 mmol/L — AB (ref 3.5–5.1)
SODIUM: 144 mmol/L (ref 135–145)

## 2016-03-19 LAB — CBC WITH DIFFERENTIAL/PLATELET
BASOS ABS: 0 10*3/uL (ref 0.0–0.1)
Basophils Relative: 0 %
EOS PCT: 1 %
Eosinophils Absolute: 0.1 10*3/uL (ref 0.0–0.7)
HEMATOCRIT: 24 % — AB (ref 36.0–46.0)
HEMOGLOBIN: 7.9 g/dL — AB (ref 12.0–15.0)
LYMPHS ABS: 3.3 10*3/uL (ref 0.7–4.0)
Lymphocytes Relative: 34 %
MCH: 26.4 pg (ref 26.0–34.0)
MCHC: 32.9 g/dL (ref 30.0–36.0)
MCV: 80.3 fL (ref 78.0–100.0)
Monocytes Absolute: 0.3 10*3/uL (ref 0.1–1.0)
Monocytes Relative: 3 %
NEUTROS ABS: 5.9 10*3/uL (ref 1.7–7.7)
NEUTROS PCT: 62 %
PLATELETS: 205 10*3/uL (ref 150–400)
RBC: 2.99 MIL/uL — AB (ref 3.87–5.11)
RDW: 20.4 % — ABNORMAL HIGH (ref 11.5–15.5)
WBC: 9.7 10*3/uL (ref 4.0–10.5)

## 2016-03-19 LAB — PROTIME-INR
INR: 5.24 (ref 0.00–1.49)
Prothrombin Time: 46.6 seconds — ABNORMAL HIGH (ref 11.6–15.2)

## 2016-03-19 MED ORDER — ONDANSETRON HCL 4 MG/2ML IJ SOLN
4.0000 mg | Freq: Four times a day (QID) | INTRAMUSCULAR | Status: DC | PRN
  Administered 2016-03-19: 4 mg via INTRAVENOUS
  Filled 2016-03-19: qty 2

## 2016-03-19 MED ORDER — WARFARIN - PHARMACIST DOSING INPATIENT: Freq: Every day | Status: DC

## 2016-03-19 MED ORDER — POTASSIUM CHLORIDE CRYS ER 20 MEQ PO TBCR
40.0000 meq | EXTENDED_RELEASE_TABLET | Freq: Once | ORAL | Status: AC
  Administered 2016-03-19: 40 meq via ORAL
  Filled 2016-03-19: qty 2

## 2016-03-19 NOTE — Care Management Important Message (Signed)
Important Message  Patient Details  Name: TUJUANA KILMARTIN MRN: [id number] Date of Birth: Feb 13, 1961   Medicare Important Message Given:  Yes    Haskell Flirt 03/19/2016, 9:31 AMImportant Message  Patient Details  Name: DEMETRICE AMSTUTZ MRN: [id number] Date of Birth: 09/27/1961   Medicare Important Message Given:  Yes    Haskell Flirt 03/19/2016, 9:31 AM

## 2016-03-19 NOTE — Progress Notes (Addendum)
ANTICOAGULATION CONSULT NOTE   Pharmacy Consult for Warfarin Indication: Mechanical AVR  Allergies  Allergen Reactions  . Adhesive [Tape] Other (See Comments)    Can burn skin if left on too long  . Celebrex [Celecoxib] Rash  . Detrol [Tolterodine] Hives    Patient Measurements: Height: 5\' 3"  (160 cm) Weight: 155 lb 12.8 oz (70.67 kg) IBW/kg (Calculated) : 52.4  Vital Signs: Temp: 97.9 F (36.6 C) (05/01 0530) Temp Source: Oral (05/01 0530) BP: 135/66 mmHg (05/01 0530) Pulse Rate: 78 (05/01 0530)  Labs:  Recent Labs  03/17/16 1801  03/17/16 2326  03/18/16 0538 03/18/16 0840 03/18/16 1910 03/19/16 0508  HGB 8.8*  --   --   --   --  8.2*  --  7.9*  HCT 28.7*  --   --   --   --  24.2*  --  24.0*  PLT 354  --   --   --   --  257  --  205  APTT 37  --   --   --   --   --   --   --   LABPROT 37.8*  --   --   --   --  49.0*  --  46.6*  INR 3.97*  --   --   --   --  5.61*  --  5.24*  CREATININE 1.79*  < >  --   < > 0.89  --  0.71 0.55  TROPONINI 0.03  --  0.09*  --  0.14* 0.12*  --   --   < > = values in this interval not displayed.  Estimated Creatinine Clearance: 74.9 mL/min (by C-G formula based on Cr of 0.55).   Medical History: Past Medical History  Diagnosis Date  . CAD (coronary artery disease)   . Obesity   . Hypercholesteremia   . HTN (hypertension)   . Dyslipidemia   . Aortic stenosis   . Thrombophlebitis   . Heart murmur   . Myocardial infarction ([facility]) 12/2014  . Pneumonia 11/2014; 12/2014  . GERD (gastroesophageal reflux disease)   . Stroke syndrome [facility]) 1995; 2001    "when my son was born; problems w/speech and L hand since then" (01/19/2015)  . Anxiety   . Depression   . Allergy   . CHF (congestive heart failure) ([facility])   . Rheumatoid arthritis(714.0)     "hands" (07/01/2015)  . Diabetes ([facility]) dx'd 1982    "went straight to insulin"    Medications:  Scheduled:  . insulin aspart  0-15 Units Subcutaneous TID WC  . insulin aspart  5 Units  Subcutaneous TID WC  . insulin detemir  20 Units Subcutaneous Daily  . phosphorus  500 mg Oral TID  . sodium chloride  1,000 mL Intravenous Once   Infusions:  . sodium chloride 50 mL/hr at 03/18/16 0903   PRN: sodium chloride  Assessment: 55 year old female admitted 4/29 with altered mental status and DKA.  She takes chronic warfarin 7.5mg  daily for AVR (INR goal 2.5-3.5 per anticoag clinic), but patient has has several recent hospital admissions and pharmacy has recommended warfarin 5mg  daily d/t SUPRAtherapeutic INR. She was discharged 4/23 with INR 3.95 (and trending up), patient instructed to hold warfarin and f/u coumadin clinic 4/25. She was seen in coumadin clinic 4/25 and INR = 1.4. She was instructed to resumed warfarin 7.5mg  daily 4/25.  INR was supratherapeutic on admission 4/29.  Pharmacy is consulted to manage dosing while inpatient.  Today, 03/19/2016  INR: SUPRAtherapeutic at 5.24 (improved from 4/30). No warfarin given inpatient  CBC: Hgb decreasing, Plts WNL. No bleeding documented  Drug interactions: none  Diet: advance to carb modified 4/30 (no intake documented)  Goal of Therapy:  INR 2.5-3.5 per clinic notes   Plan:   Hold warfarin for supratherapeutic INR  No vitamin K needed unless bleeding or high risk of bleeding  Daily PT/INR  Monitor for signs/symptoms of bleeding  Juliette Alcide, PharmD, BCPS.   Pager: [phone] 03/19/2016 8:01 AM

## 2016-03-19 NOTE — Progress Notes (Signed)

## 2016-03-19 NOTE — Progress Notes (Signed)
CSW consulted for SNF placement and possible guardianship. Pt presents as alert / oriented x4. NSG also notes pt is alert / oriented x4 . CSW met with pt at bedside to assist with d/c planning. Pt reports she has PT services at home. Pt declines SNF placement at this time. " I'm not going to a nursing home. I'm going back home. " please re consult CSW if plan changes.  Werner Lean LCSW [phone]

## 2016-03-19 NOTE — Clinical Documentation Improvement (Signed)
Pulmonology  Can the diagnosis of CHF be further specified listed in PMH? Please document findings in next progress note if known.    Acuity - Acute, Chronic, Acute on Chronic   Type - Systolic, Diastolic, Systolic and Diastolic  Other  Clinically Undetermined  Document any associated diagnoses/conditions  Supporting Information:  ECHO - 08/01/15 reveals EF of 45 - 50% with hypokinesis of the mid-apical anteroseptal and anterior myocardium. Features c/w Grade 2 diastolic dysfunction with abnormal relaxation and increased filling pressure  Please exercise your independent, professional judgment when responding. A specific answer is not anticipated or expected.  Thank You,  Shellee Milo RN, BSN, CCDS Health Information Management [place] [phone]; Cell: [phone]

## 2016-03-19 NOTE — Consult Note (Signed)
   [facility] CM Inpatient Consult   03/19/2016  MAKAYLE KRAHN [birth year] [id number]   Patient has been active with [facility] Care Management program.  [facility] has been following patient closely post last hospitalization. Despite this, patient continues to be readmitted with DKA. Patient reports she is taking her insulin. States she does not know why her blood sugars are running so high. Patient has a history of depression. Wondering if patient could benefit from a psychiatric evaluation while in the hospital. Patient lives alone and has had aide services. Has been active with [facility] as well. Patient continues to be noncompliant with her diet as well. [facility] RNCM has made several home visits and numerous contacts in trying to keep patient on track. Despite best efforts she continues to be readmitted with DKA. Left voicemail for inpatient RNCM to request call back regarding patient. Will continue to follow. Also spoke with patient at bedside about SNF placement. Discussed that her DM would be managed better if she was at a facility. She adamantly continues to decline by stating " those places are horrible, I am not going to no nursing home". Will continue to follow. Spoke with [facility] RN[facility] as well about patient.  Raiford Noble, MSN-Ed, RN,BSN [facility] Liaison [phone]

## 2016-03-19 NOTE — Progress Notes (Addendum)
CRITICAL VALUE ALERT  Critical value received:  INR-5.24  Date of notification:  03/19/2016  Time of notification:  0620  Critical value read back:yes  Nurse who received alert: Carollee Herter Amburn,rn/reported to Jamesetta Orleans Kerry Chisolm,rn  MD notified (1st page):  L.Harduk  Time of first page:  0620  MD notified (2nd page):  Time of second page:  Responding MD:  L.Harduk  Time MD responded: 0630

## 2016-03-19 NOTE — Progress Notes (Signed)
Nursing Note: Pt had IV site at inside of L ant. Wrist,Noted it and pt requested it be d/cd ,painful.A: site d/cd with cathter intact,Felt better as soon as out,wbb

## 2016-03-19 NOTE — Progress Notes (Signed)
PROGRESS NOTE  Madison [name] LTJ:[id number] DOB: November 10, 1961 DOA: 03/17/2016 PCP: Dorothyann Peng, NP Outpatient Specialists:    LOS: 2 days    Subjective: Has no complaints today. Appetite is good, no episodes of vomiting in 2 days.  Brief Narrative: 55 year old with past medical history of diabetes with multiple admissions for DKA secondary to noncompliance and dietary indiscretion, coronary artery disease s/p CABG, AVR, CHF, and hypertension who presented after 2 days of vomiting, fatigue, and polyuria. Had not checked her sugars in 2 days because her glucometer was broken. Noted to have a K+ of 7.0, Cr 1.9, WBC of 14.0 undetectable bicarb, LA of 10, glucose 1187. She was started on a insulin drip. Started on empiric zosyn/vanc- both have been discontinued due to no evidence of infection.  Assessment & Plan: Principal Problem:   DKA (diabetic ketoacidoses) ([place]) Active Problems:   Diabetes type 1, uncontrolled (HCC)   S/P aortic valve replacement   ARF (acute renal failure) (HCC)   Chronic systolic heart failure (HCC)   Acute encephalopathy   DKA/ wide anion gap metabolic acidosis/ SIRS - pH 6.909, bicarb 3.3, anion gap 24, glucose 1187 - met SIRS criteria with RR >21, WBCs 14.0, HR 109- no source of infection found - she was given an insulin bolus followed by and insulin drip and sodium acetate (due to sodium bicarb shortage in the hospital)   - vitals now stable, was transferred from SDU to floor  Type 1 diabetes mellitus - 7 admissions in the past 6 months for DKA (last admit 03/08/2016-> 03/11/2016) - Uses Levemir and novolog at home- continue these + SSI - Glucose in the 100s for past few days - She has an aide who comes to her apartment for 3 hours each day, but the patient seems unable to manage diabetes by self given multiple admissions- will consult social work  Acute kidney injury - sCr 1.9 on admission (baseline <1) - likely due to prerenal azotemia from fluid  depletion - resolved, creatinine 0.55 today - continue to monitor   Hypertension - was hypotensive on admission, likely secondary volume depletion - BP responded to IVFs, in the 110s currently - continue to hold lisinopril  Hyperkalemia - K+ 7.3 on admission, EKG showed peaked T waves - Was given calcium gluconate - K+ 3.3 this morning - replete and follow  Leukocytosis - Afebrile, WBC 14.0, lactic acid 10.2 on admission - was started on zosyn/vanc empirically  - CXR negative, UA negative, NGTD blood/urine - lactic acid 10.2, WBC 9.7 today - antibiotics were discontinued yesterday by PCCM as no evidence of infection  Supratherapeutic INR - On coumadin s/p AVR (Goal 2.5-3.5) - INR 3.59 initially, 5.24 today - Coumadin was supratherapeutic during last admission as well- she followed up with coumadin clinic who changed her dose to 7.54m daily - continue to hold coumadin, no evidence of bleeding  Elevated troponin - 0.03 initially, peak of 0.14 - denies any chest pain, troponin was mildly elevated during her last 2 DKA admissions - history of MI (11/2015)- troponin peak of 15.04 at that time   Combined systolic/diastolic heart failure  - Echo 07/2015 showed EF of 45-50% with grade 2 diastolic dysfunction - appears compensated at this time - monitor I&Os and fluid status  Anemia - of chronic disease, no overt bleeding - 10.3 on admission, 7.9 today- asymptomatic - Monitor. Will transfuse if continues Hgb to fall  Hypophosphatemia - 2.0 on 4/30 - replete and follow  Depression - continue Celexa  and Trazodone   DVT prophylaxis: SCDs Code Status: Full Family Communication: None at bedside Disposition Plan: Remain inpatient  Consultants:   none  Procedures:  2D echo: 07/2015- Left ventricle: The cavity size was normal. Wall thickness was normal. Systolic function was mildly reduced. The estimated ejection fraction was in the range of 45% to 50%. Hypokinesis of the  mid-apicalanteroseptal and anterior myocardium. Features are consistent with a pseudonormal left ventricular filling pattern, with concomitant abnormal relaxation and increased filling pressure (grade 2 diastolic dysfunction). - Ventricular septum: Septal motion showed paradox. - Aortic valve: A mechanical prosthesis was present and functioning normally.    Antimicrobials:  Vancomycin (04/29 >> 04/30)  Zosyn (04/29 >> 04/30)   Objective: Filed Vitals:   03/18/16 1710 03/18/16 2026 03/18/16 2307 03/19/16 0530  BP: 117/46 116/50  135/66  Pulse: 79 79 72 78  Temp: 98.4 F (36.9 C) 98.4 F (36.9 C)  97.9 F (36.6 C)  TempSrc: Oral Oral  Oral  Resp: _0 Height: _1  (1.6 m)     Weight: 70.67 kg (155 lb 12.8 oz)     SpO2: 98% 100% 98% 97%    Intake/Output Summary (Last 24 hours) at 03/19/16 1042 Last data filed at 03/19/16 1005  Gross per 24 hour  Intake   1000 ml  Output   2675 ml  Net  -1675 ml   Filed Weights   03/17/16 1848 03/18/16 0500 03/18/16 1710  Weight: 61.4 kg (135 lb 5.8 oz) 65.5 kg (144 lb 6.4 oz) 70.67 kg (155 lb 12.8 oz)    Examination: Constitutional: NAD, calm, comfortable Filed Vitals:   03/18/16 1710 03/18/16 2026 03/18/16 2307 03/19/16 0530  BP: 117/46 116/50  135/66  Pulse: 79 79 72 78  Temp: 98.4 F (36.9 C) 98.4 F (36.9 C)  97.9 F (36.6 C)  TempSrc: Oral Oral  Oral  Resp: _2 Height: _3  (1.6 m)     Weight: 70.67 kg (155 lb 12.8 oz)     SpO2: 98% 100% 98% 97%   General: Awake, alert, NAD Eyes: PERRL, lids and conjunctivae normal ENMT: Mucous membranes are moist. Neck: normal, supple, no masses, Respiratory: clear to auscultation bilaterally, no wheezing, no crackles. Normal respiratory effort. No accessory muscle use.  Cardiovascular: Regular rate and rhythm, systolic murmur. No extremity edema. 2+ pedal pulses. No JVD. Abdomen: no tenderness, no masses palpated.Bowel sounds positive.  Skin: no rashes, lesions,  ulcers. Neurologic: Moving all extremities. Psychiatric:  Alert and oriented x 3. Normal mood.    Data Reviewed: I have personally reviewed following labs and imaging studies  CBC:  Recent Labs Lab 03/17/16 1345 03/17/16 1448 03/17/16 1801 03/18/16 0840 03/19/16 0508  WBC 14.0*  --  19.9* 14.9* 9.7  NEUTROABS  --   --  17.1*  --  5.9  HGB 10.3* 13.3 8.8* 8.2* 7.9*  HCT 36.4 39.0 28.7* 24.2* 24.0*  MCV 95.0  --  90.0 82.3 80.3  PLT 412*  --  354 257 818   Basic Metabolic Panel:  Recent Labs Lab 03/17/16 1801 03/17/16 2204 03/18/16 0129 03/18/16 0538 03/18/16 0840 03/18/16 1910 03/19/16 0508  NA 135 141 144 140  --  142 144  K 5.1 5.5* 4.4 3.8  --  3.1* 3.3*  CL 105 115* 117* 108  --  106 108  CO2 6* 12* 17* 19*  --  25 28  GLUCOSE 835* 300* 140* 152*  --  114* 110*  BUN 24* _0 --  9 5*  CREATININE 1.79* 1.36* 1.03* 0.89  --  0.71 0.55  CALCIUM 7.9* 8.0* 7.9* 7.5*  --  7.9* 7.7*  MG 2.1  --   --   --  1.4*  --   --   PHOS 6.0*  --   --   --  2.0*  --   --    GFR: Estimated Creatinine Clearance: 74.9 mL/min (by C-G formula based on Cr of 0.55). Liver Function Tests:  Recent Labs Lab 03/17/16 1320 03/17/16 1801  AST 23 21  ALT 19 17  ALKPHOS 149* 128*  BILITOT 1.5* 1.3*  PROT 6.9 5.9*  ALBUMIN 3.6 3.1*    Recent Labs Lab 03/17/16 1320 03/17/16 1801  LIPASE 27 73*  AMYLASE  --  80   No results for input(s): AMMONIA in the last 168 hours. Coagulation Profile:  Recent Labs Lab 03/13/16 03/17/16 1345 03/17/16 1801 03/18/16 0840 03/19/16 0508  INR 1.4 3.59* 3.97* 5.61* 5.24*   Cardiac Enzymes:  Recent Labs Lab 03/17/16 1801 03/17/16 2326 03/18/16 0538 03/18/16 0840  TROPONINI 0.03 0.09* 0.14* 0.12*   BNP (last 3 results) No results for input(s): PROBNP in the last 8760 hours. HbA1C: No results for input(s): HGBA1C in the last 72 hours. CBG:  Recent Labs Lab 03/18/16 1637 03/18/16 1652 03/18/16 1714 03/18/16 2120  03/19/16 0749  GLUCAP 64* 63* 83 133* 150*   Lipid Profile: No results for input(s): CHOL, HDL, LDLCALC, TRIG, CHOLHDL, LDLDIRECT in the last 72 hours. Thyroid Function Tests: No results for input(s): TSH, T4TOTAL, FREET4, T3FREE, THYROIDAB in the last 72 hours. Anemia Panel: No results for input(s): VITAMINB12, FOLATE, FERRITIN, TIBC, IRON, RETICCTPCT in the last 72 hours. Urine analysis:    Component Value Date/Time   COLORURINE YELLOW 03/18/2016 0627   APPEARANCEUR CLOUDY* 03/18/2016 0627   LABSPEC 1.020 03/18/2016 0627   PHURINE 5.0 03/18/2016 0627   GLUCOSEU >1000* 03/18/2016 0627   HGBUR NEGATIVE 03/18/2016 0627   BILIRUBINUR NEGATIVE 03/18/2016 0627   BILIRUBINUR n 06/16/2015 1244   KETONESUR >80* 03/18/2016 0627   PROTEINUR >300* 03/18/2016 0627   PROTEINUR n 06/16/2015 1244   UROBILINOGEN 0.2 07/30/2015 1415   UROBILINOGEN 0.2 06/16/2015 1244   NITRITE NEGATIVE 03/18/2016 0627   NITRITE n 06/16/2015 1244   LEUKOCYTESUR NEGATIVE 03/18/2016 0627   Sepsis Labs: Invalid input(s): PROCALCITONIN, LACTICIDVEN  Recent Results (from the past 240 hour(s))  Blood culture (routine x 2)     Status: None (Preliminary result)   Collection Time: 03/17/16  2:50 PM  Result Value Ref Range Status   Specimen Description BLOOD LEFT ANTECUBITAL  Final   Special Requests BOTTLES DRAWN AEROBIC AND ANAEROBIC 5CC EA  Final   Culture   Final    NO GROWTH < 12 HOURS Performed at [facility]    Report Status PENDING  Incomplete  Blood culture (routine x 2)     Status: None (Preliminary result)   Collection Time: 03/17/16  3:10 PM  Result Value Ref Range Status   Specimen Description BLOOD RIGHT ARM  Final   Special Requests BOTTLES DRAWN AEROBIC AND ANAEROBIC 5CC EA  Final   Culture   Final    NO GROWTH < 12 HOURS Performed at [facility]    Report Status PENDING  Incomplete  MRSA PCR Screening     Status: None   Collection Time: 03/17/16  6:46 PM  Result Value Ref  Range Status  MRSA by PCR NEGATIVE NEGATIVE Final    Comment:        The GeneXpert MRSA Assay (FDA approved for NASAL specimens only), is one component of a comprehensive MRSA colonization surveillance program. It is not intended to diagnose MRSA infection nor to guide or monitor treatment for MRSA infections.   Urine culture     Status: None (Preliminary result)   Collection Time: 03/18/16  6:27 AM  Result Value Ref Range Status   Specimen Description URINE, CATHETERIZED  Final   Special Requests NONE  Final   Culture   Final    NO GROWTH < 24 HOURS Performed at [facility]    Report Status PENDING  Incomplete      Radiology Studies: Dg Chest Port 1 View  03/17/2016  CLINICAL DATA:  Status post right IJ line repositioning. Initial encounter. EXAM: PORTABLE CHEST 1 VIEW COMPARISON:  Chest radiograph performed earlier today at 5:59 p.m. FINDINGS: The lungs are well-aerated and clear. There is no evidence of focal opacification, pleural effusion or pneumothorax. The cardiomediastinal silhouette is normal in size; the patient is status post median sternotomy. No acute osseous abnormalities are seen. A right IJ line is noted again with its tip slightly curved, suggesting azygos positioning, though it has been retracted since the prior study. This could be retracted another 2 cm. IMPRESSION: 1. Right IJ line noted with its tip slightly curved, suggesting azygos positioning, though it has been retracted since the prior study. This could be retracted another 2 cm. 2. No acute cardiopulmonary process seen. These results were called by telephone at the time of interpretation on 03/17/2016 at 10:18 pm to Wilson at the [facility], who verbally acknowledged these results. Electronically Signed   By: Garald Balding M.D.   On: 03/17/2016 22:22   Dg Chest Port 1 View  03/17/2016  CLINICAL DATA:  Acute respiratory failure EXAM: PORTABLE CHEST 1 VIEW COMPARISON:  Chest  radiograph from earlier today. FINDINGS: Sternotomy wires appear aligned and intact. Right internal jugular central venous catheter enters the superior vena cava and curves medially at the tip, suggesting azygous positioning. Stable cardiomediastinal silhouette with normal heart size. No pneumothorax. No pleural effusion. Lungs appear clear, with no acute consolidative airspace disease and no pulmonary edema. IMPRESSION: 1. Right internal jugular central venous catheter enters the SVC anchors medially at the tip, suggesting azygous positioning. Recommend retracting 2-3 cm. 2. No pneumothorax.  No active cardiopulmonary disease. These results were called by telephone at the time of interpretation on 03/17/2016 at 6:25 pm to Dr. Ashok Cordia, who verbally acknowledged these results. Electronically Signed   By: Ilona Sorrel M.D.   On: 03/17/2016 18:51   Dg Chest Portable 1 View  03/17/2016  CLINICAL DATA:  Vomiting, hyperglycemia, hypertension. History of cardiac catheterization and CABG. EXAM: PORTABLE CHEST 1 VIEW COMPARISON:  Chest x-rays dated 03/09/2016 and 09/16/2015. FINDINGS: Heart size is normal. Overall cardiomediastinal silhouette is stable in size and configuration. Median sternotomy wires appear intact and stable in alignment. Lungs are clear. Lung volumes are normal. No pleural effusion or pneumothorax seen. Osseous structures about the chest are unremarkable. IMPRESSION: No active disease. Electronically Signed   By: Franki Cabot M.D.   On: 03/17/2016 15:25     Scheduled Meds: . insulin aspart  0-15 Units Subcutaneous TID WC  . insulin aspart  5 Units Subcutaneous TID WC  . insulin detemir  20 Units Subcutaneous Daily  . sodium chloride  1,000 mL Intravenous  Once  . Warfarin - Pharmacist Dosing Inpatient   Does not apply q1800   Continuous Infusions: . sodium chloride 50 mL/hr at 03/18/16 3428       Time spent: 35 minutes  Kara Dies PA-S wrote parts of this note  Markeia Harkless A,  MD [facility]  If 7PM-7AM, please contact night-coverage [email or web address] Password [email or web address] 03/19/2016, 10:42 AM

## 2016-03-19 NOTE — Progress Notes (Signed)
Hypoglycemic Event  CBG: 44 Treatment: 15 GM carbohydrate snack  Symptoms: None  Follow-up CBG: Time:1756 CBG Result58  Pos  Comments/MD notifiedDR [employer], Illene Silver

## 2016-03-19 NOTE — Progress Notes (Signed)
Hypoglycemic Event  CBG:65  Treatment: 15 GM carbohydrate snack  Symptoms: None  Follow-up CBG: Time:1859 CBG Result:82  Possible Reasons for Event:    Comments/MD notified:Dr Chales Salmon, Illene Silver

## 2016-03-20 ENCOUNTER — Ambulatory Visit: Payer: Commercial Managed Care - HMO

## 2016-03-20 ENCOUNTER — Telehealth: Payer: Self-pay | Admitting: Adult Health

## 2016-03-20 ENCOUNTER — Ambulatory Visit: Payer: Self-pay

## 2016-03-20 LAB — GLUCOSE, CAPILLARY
GLUCOSE-CAPILLARY: 246 mg/dL — AB (ref 65–99)
Glucose-Capillary: 82 mg/dL (ref 65–99)
Glucose-Capillary: 174 mg/dL — ABNORMAL HIGH (ref 65–99)

## 2016-03-20 LAB — BASIC METABOLIC PANEL
Anion gap: 5 (ref 5–15)
BUN: 5 mg/dL — AB (ref 6–20)
CHLORIDE: 110 mmol/L (ref 101–111)
CO2: 25 mmol/L (ref 22–32)
CREATININE: 0.52 mg/dL (ref 0.44–1.00)
Calcium: 8.2 mg/dL — ABNORMAL LOW (ref 8.9–10.3)
GFR calc Af Amer: >60 mL/min (ref >60)
GFR calc non Af Amer: >60 mL/min (ref >60)
Glucose, Bld: 119 mg/dL — ABNORMAL HIGH (ref 65–99)
POTASSIUM: 4.6 mmol/L (ref 3.5–5.1)
SODIUM: 140 mmol/L (ref 135–145)

## 2016-03-20 LAB — CBC
HCT: 26.4 % — ABNORMAL LOW (ref 36.0–46.0)
Hemoglobin: 8.6 g/dL — ABNORMAL LOW (ref 12.0–15.0)
MCH: 27.2 pg (ref 26.0–34.0)
MCHC: 32.6 g/dL (ref 30.0–36.0)
MCV: 83.5 fL (ref 78.0–100.0)
PLATELETS: 182 10*3/uL (ref 150–400)
RBC: 3.16 MIL/uL — ABNORMAL LOW (ref 3.87–5.11)
RDW: 20.5 % — ABNORMAL HIGH (ref 11.5–15.5)
WBC: 5.5 10*3/uL (ref 4.0–10.5)

## 2016-03-20 LAB — PHOSPHORUS: PHOSPHORUS: 3.8 mg/dL (ref 2.5–4.6)

## 2016-03-20 LAB — URINE CULTURE: Culture: NO GROWTH

## 2016-03-20 LAB — MAGNESIUM: Magnesium: 2 mg/dL (ref 1.7–2.4)

## 2016-03-20 LAB — PROTIME-INR
INR: 2.93 — ABNORMAL HIGH (ref 0.00–1.49)
PROTHROMBIN TIME: 30 s — AB (ref 11.6–15.2)

## 2016-03-20 MED ORDER — WARFARIN SODIUM 5 MG PO TABS: 7.5000 mg | ORAL_TABLET | Freq: Once | ORAL | Status: DC

## 2016-03-20 NOTE — Progress Notes (Signed)
Inpatient Diabetes Program Recommendations  AACE/ADA: New Consensus Statement on Inpatient Glycemic Control (2015)  Target Ranges:  Prepandial:   less than 140 mg/dL      Peak postprandial:   less than 180 mg/dL (1-2 hours)      Critically ill patients:  140 - 180 mg/dL   Results for BILLY, ROCCO (MRN [id number]) as of 03/20/2016 10:13  Ref. Range 03/19/2016 07:49 03/19/2016 12:23 03/19/2016 17:22 03/19/2016 17:56 03/19/2016 18:21 03/19/2016 18:59 03/19/2016 21:47  Glucose-Capillary Latest Ref Range: 65-99 mg/dL 540 (H) 086 (H) 44 (LL) 58 (L) 65 82 82    Home DM Meds: Levemir 25 units daily       Novolog 5 units tidwc  Current Insulin Orders: Levemir 20 units daily      Novolog 5 units tidwc      Novolog Moderate Correction Scale/ SSI (0-15 units) TID AC      MD- Note patient had severe Hypoglycemia last evening at 5pm after receiving a total of 10 units Novolog at 12pm (5 units SSI + 5 units meal coverage).  If this occurs again, please consider the following insulin adjustments:  1. Reduce Novolog Correction Scale/ SSI to Sensitive scale (0-9 units) TID AC   2. Reduce Novolog Meal Coverage to 4 units tidwc      --Will follow patient during hospitalization--  Ambrose Finland RN, MSN, CDE Diabetes Coordinator Inpatient Glycemic Control Team Team Pager: [phone] (8a-5p)

## 2016-03-20 NOTE — Telephone Encounter (Signed)
Spoke with pharmacist - Rx or one touch accu check and test strips called in. Thanks.

## 2016-03-20 NOTE — Discharge Summary (Signed)
Physician Discharge Summary  Madison [name] FTD:[id number] DOB: 14-Nov-1961 DOA: 03/17/2016  PCP: Dorothyann Peng, NP  Admit date: 03/17/2016 Discharge date: 03/20/2016  Time spent: 40 minutes  Recommendations for Outpatient Follow-up:  1. Follow-up with primary care physician within one week. 2. [facility] care management program to continue   Discharge Diagnoses:  Principal Problem:   DKA (diabetic ketoacidoses) ([place]) Active Problems:   Diabetes type 1, uncontrolled ([place])   S/P aortic valve replacement   ARF (acute renal failure) (H[facility])   Chronic systolic heart failure (H[facility])   Acute encephalopathy   Discharge Condition: Stable  Diet recommendation: Carbohydrate modified diet  Filed Weights   03/18/16 0500 03/18/16 1710 03/20/16 0618  Weight: 65.5 kg (144 lb 6.4 oz) 70.67 kg (155 lb 12.8 oz) 68.448 kg (150 lb 14.4 oz)    History of present illness:  55 year old with multiple medical problems including diabetes, coronary artery disease s/p CABG, CHF, hypertension. Last hospital visit was from 4/9/1 7-4/13/17. She's had multiple admissions in the past for DKA secondary to noncompliance and dietary indiscretion. Noted to have a K of 7.0, Cr 1.9, undetectable bicarb, LA of 10. She was started on a insulin drip. PCCM called for admission.  Hospital Course:   DKA/ wide anion gap metabolic acidosis/ SIRS - pH 6.909, bicarb 3.3, anion gap 24, glucose 1187 - met SIRS criteria with RR >21, WBCs 14.0, HR 109- no source of infection found - Treated with intensive insulin therapy. -Back and her regular dose of Levemir insulin at home. -There was question about compliance medications and that she will do better at a facility, patient declined any type of placement. She will go back to her home with her roommate, [facility] case management aware.  Type 1 diabetes mellitus - 7 admissions in the past 6 months for DKA (last admit 03/08/2016-> 03/11/2016) - Uses Levemir and novolog at home- continue these +  SSI - Glucose in the 100s for past few days - Had hypoglycemic episode at 44, her compliance medications is questionable.  Acute kidney injury - sCr 1.9 on admission (baseline <1) - likely due to prerenal azotemia from fluid depletion - resolved, creatinine 0.55 today  Hypertension - was hypotensive on admission, likely secondary volume depletion - BP responded to IVFs, in the 110s currently  Hyperkalemia - K+ 7.3 on admission, EKG showed peaked T waves - Was given calcium gluconate, this resolved after hydration and insulin therapy.  Leukocytosis - Afebrile, WBC 14.0, lactic acid 10.2 on admission - was started on zosyn/vanc empirically  - CXR negative, UA negative, NGTD blood/urine - lactic acid 10.2, WBC 9.7 today - antibiotics were discontinued yesterday by PCCM as no evidence of infection  Supratherapeutic INR - On coumadin s/p AVR (Goal 2.5-3.5) - INR 3.59 initially, went to a peak of 5.24, is back to therapeutic level at 2.9, restart Coumadin at home dose.  Elevated troponin - 0.03 initially, peak of 0.14, the troponin - denies any chest pain, troponin was mildly elevated during her last 2 DKA admissions  Combined systolic/diastolic heart failure  - Echo 07/2015 showed EF of 45-50% with grade 2 diastolic dysfunction - appears compensated at this time - monitor I&Os and fluid status  Anemia - of chronic disease, no overt bleeding - 10.3 on admission, 7.9 today- asymptomatic  Hypophosphatemia - 2.0 on 4/30 - replete and follow  Depression - continue Celexa and Trazodone   Procedures:  None  Consultations:  Was under PCCM on admission  Discharge Exam: Filed Vitals:  03/19/16 2148 03/20/16 0618  BP: 131/71 136/77  Pulse: 72 64  Temp: 98.7 F (37.1 C) 98.4 F (36.9 C)  Resp: 16 16   General: Alert and awake, oriented x3, not in any acute distress. HEENT: anicteric sclera, pupils reactive to light and accommodation, EOMI CVS: S1-S2 clear, no  murmur rubs or gallops Chest: clear to auscultation bilaterally, no wheezing, rales or rhonchi Abdomen: soft nontender, nondistended, normal bowel sounds, no organomegaly Extremities: no cyanosis, clubbing or edema noted bilaterally Neuro: Cranial nerves II-XII intact, no focal neurological deficits  Discharge Instructions   Discharge Instructions    Diet - low sodium heart healthy    Complete by:  As directed      Increase activity slowly    Complete by:  As directed           Current Discharge Medication List    CONTINUE these medications which have NOT CHANGED   Details  Blood Glucose Monitoring Suppl ([place]) w/Device KIT Use to check glucose 2x per day Qty: 1 each, Refills: 0    citalopram (CELEXA) 40 MG tablet Take 1 tablet (40 mg total) by mouth daily. Qty: 90 tablet, Refills: 3   Associated Diagnoses: Depression    glucose blood test strip Use as instructed Qty: 100 each, Refills: 12    insulin aspart (NOVOLOG) 100 UNIT/ML injection Inject 5 Units into the skin 3 (three) times daily with meals. Qty: 10 mL, Refills: 2    insulin detemir (LEVEMIR) 100 UNIT/ML injection Inject 0.25 mLs (25 Units total) into the skin daily. Qty: 10 mL, Refills: 0   Associated Diagnoses: Hyperglycemia    lisinopril (PRINIVIL,ZESTRIL) 2.5 MG tablet Take 2.5 mg by mouth daily. Refills: 1    omeprazole (PRILOSEC) 40 MG capsule TAKE 1 CAPSULE (40 MG TOTAL) BY MOUTH DAILY. Qty: 90 capsule, Refills: 1    ondansetron (ZOFRAN) 4 MG tablet Take 1 tablet (4 mg total) by mouth every 8 (eight) hours as needed for nausea or vomiting. Qty: 20 tablet, Refills: 1   Associated Diagnoses: Intractable vomiting with nausea, vomiting of unspecified type    polyethylene glycol (MIRALAX / GLYCOLAX) packet Take 17 g by mouth 2 (two) times daily. Qty: 14 each, Refills: 0    pravastatin (PRAVACHOL) 20 MG tablet TAKE 1 TABLET BY MOUTH EVERY DAY Qty: 90 tablet, Refills: 0    traZODone  (DESYREL) 50 MG tablet Take 50 mg by mouth at bedtime.  Refills: 0    warfarin (COUMADIN) 5 MG tablet Take 1 tablet (5 mg total) by mouth daily. If okay with coumadin clinic. Qty: 7 tablet, Refills: 0       Allergies  Allergen Reactions  . Adhesive [Tape] Other (See Comments)    Can burn skin if left on too long  . Celebrex [Celecoxib] Rash  . Detrol [Tolterodine] Hives   Follow-up Information    Follow up with Dorothyann Peng, NP In 1 week.   Specialty:  Family Medicine   Contact information:   [address] [place] [address] [phone]        The results of significant diagnostics from this hospitalization (including imaging, microbiology, ancillary and laboratory) are listed below for reference.    Significant Diagnostic Studies: Dg Chest 2 View  03/09/2016  CLINICAL DATA:  Leukocytosis. EXAM: CHEST  2 VIEW COMPARISON:  02/11/2016 FINDINGS: The heart size and mediastinal contours are within normal limits. Diffuse bronchial wall thickening is identified bilaterally. Both lungs are clear. The visualized  skeletal structures are unremarkable. IMPRESSION: 1. No evidence for pneumonia. 2. Bronchial wall thickening noted. Electronically Signed   By: Kerby Moors M.D.   On: 03/09/2016 09:50   Ct Head Wo Contrast  02/26/2016  CLINICAL DATA:  Acute altered mental status with DKA. Acute encephalopathy. EXAM: CT HEAD WITHOUT CONTRAST TECHNIQUE: Contiguous axial images were obtained from the base of the skull through the vertex without contrast. COMPARISON:  02/09/2016 FINDINGS: Extensive encephalomalacia in the posterior right temporal lobe and right parietal lobe. There is also encephalomalacia in the right frontal lobe. 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 Encephalomalacia in the left occipital lobe and the left anterior parietal lobe. Stable cerebral atrophy. No evidence for acute hemorrhage, mass lesion, midline shift or hydrocephalus. Difficult to evaluate for an  acute infarct based on the old ischemic changes. There is also diffuse low density throughout the white matter. Visualized sinuses are clear. No calvarial fracture. IMPRESSION: No acute intracranial abnormality. Extensive chronic ischemic changes with widespread encephalomalacia. Electronically Signed   By: Markus Daft M.D.   On: 02/26/2016 13:41   Ct Chest Wo Contrast  02/26/2016  CLINICAL DATA:  Short of breath. Rapid breathing. Altered mental status. EXAM: CT CHEST WITHOUT CONTRAST TECHNIQUE: Multidetector CT imaging of the chest was performed following the standard protocol without IV contrast. COMPARISON:  02/11/2016 FINDINGS: Neck base and axilla: Small low-density lesions in the thyroid consistent with sub cm cysts. No neck base or axillary masses or adenopathy. Mediastinum/Lymph Nodes: Heart normal in size. There are dense coronary artery calcifications and changes from previous cardiac surgery and aortic valve replacement. Great vessels are not normal in caliber. No mediastinal or hilar masses or pathologically enlarged lymph nodes. Lungs/Pleura: No pulmonary mass, infiltrate, or effusion. Upper abdomen: No acute findings. Musculoskeletal: Mild degenerative changes of the visualized spine. No fractures. No osteoblastic or osteolytic lesions. IMPRESSION: 1. No acute findings.  Lungs are clear. Electronically Signed   By: Lajean Manes M.D.   On: 02/26/2016 13:35   Dg Chest Port 1 View  03/17/2016  CLINICAL DATA:  Status post right IJ line repositioning. Initial encounter. EXAM: PORTABLE CHEST 1 VIEW COMPARISON:  Chest radiograph performed earlier today at 5:59 p.m. FINDINGS: The lungs are well-aerated and clear. There is no evidence of focal opacification, pleural effusion or pneumothorax. The cardiomediastinal silhouette is normal in size; the patient is status post median sternotomy. No acute osseous abnormalities are seen. A right IJ line is noted again with its tip slightly curved, suggesting azygos  positioning, though it has been retracted since the prior study. This could be retracted another 2 cm. IMPRESSION: 1. Right IJ line noted with its tip slightly curved, suggesting azygos positioning, though it has been retracted since the prior study. This could be retracted another 2 cm. 2. No acute cardiopulmonary process seen. These results were called by telephone at the time of interpretation on 03/17/2016 at 10:18 pm to [place] at the [facility], who verbally acknowledged these results. Electronically Signed   By: Garald Balding M.D.   On: 03/17/2016 22:22   Dg Chest Port 1 View  03/17/2016  CLINICAL DATA:  Acute respiratory failure EXAM: PORTABLE CHEST 1 VIEW COMPARISON:  Chest radiograph from earlier today. FINDINGS: Sternotomy wires appear aligned and intact. Right internal jugular central venous catheter enters the superior vena cava and curves medially at the tip, suggesting azygous positioning. Stable cardiomediastinal silhouette with normal heart size. No pneumothorax. No pleural effusion. Lungs appear clear, with  no acute consolidative airspace disease and no pulmonary edema. IMPRESSION: 1. Right internal jugular central venous catheter enters the SVC anchors medially at the tip, suggesting azygous positioning. Recommend retracting 2-3 cm. 2. No pneumothorax.  No active cardiopulmonary disease. These results were called by telephone at the time of interpretation on 03/17/2016 at 6:25 pm to Dr. Ashok Cordia, who verbally acknowledged these results. Electronically Signed   By: Ilona Sorrel M.D.   On: 03/17/2016 18:51   Dg Chest Portable 1 View  03/17/2016  CLINICAL DATA:  Vomiting, hyperglycemia, hypertension. History of cardiac catheterization and CABG. EXAM: PORTABLE CHEST 1 VIEW COMPARISON:  Chest x-rays dated 03/09/2016 and 09/16/2015. FINDINGS: Heart size is normal. Overall cardiomediastinal silhouette is stable in size and configuration. Median sternotomy wires appear intact and  stable in alignment. Lungs are clear. Lung volumes are normal. No pleural effusion or pneumothorax seen. Osseous structures about the chest are unremarkable. IMPRESSION: No active disease. Electronically Signed   By: Franki Cabot M.D.   On: 03/17/2016 15:25    Microbiology: Recent Results (from the past 240 hour(s))  Blood culture (routine x 2)     Status: None (Preliminary result)   Collection Time: 03/17/16  2:50 PM  Result Value Ref Range Status   Specimen Description BLOOD LEFT ANTECUBITAL  Final   Special Requests BOTTLES DRAWN AEROBIC AND ANAEROBIC 5CC EA  Final   Culture   Final    NO GROWTH 3 DAYS Performed at [facility]    Report Status PENDING  Incomplete  Blood culture (routine x 2)     Status: None (Preliminary result)   Collection Time: 03/17/16  3:10 PM  Result Value Ref Range Status   Specimen Description BLOOD RIGHT ARM  Final   Special Requests BOTTLES DRAWN AEROBIC AND ANAEROBIC 5CC EA  Final   Culture   Final    NO GROWTH 3 DAYS Performed at [facility]    Report Status PENDING  Incomplete  MRSA PCR Screening     Status: None   Collection Time: 03/17/16  6:46 PM  Result Value Ref Range Status   MRSA by PCR NEGATIVE NEGATIVE Final    Comment:        The GeneXpert MRSA Assay (FDA approved for NASAL specimens only), is one component of a comprehensive MRSA colonization surveillance program. It is not intended to diagnose MRSA infection nor to guide or monitor treatment for MRSA infections.   Urine culture     Status: None   Collection Time: 03/18/16  6:27 AM  Result Value Ref Range Status   Specimen Description URINE, CATHETERIZED  Final   Special Requests NONE  Final   Culture   Final    NO GROWTH 2 DAYS Performed at [facility]    Report Status 03/20/2016 FINAL  Final     Labs: Basic Metabolic Panel:  Recent Labs Lab 03/17/16 1801  03/18/16 0129 03/18/16 8676 03/18/16 0840 03/18/16 1910 03/19/16 0508  03/20/16 0521  NA 135  < > 144 140  --  142 144 140  K 5.1  < > 4.4 3.8  --  3.1* 3.3* 4.6  CL 105  < > 117* 108  --  106 108 110  CO2 6*  < > 17* 19*  --  '25 28 25  ' GLUCOSE 835*  < > 140* 152*  --  114* 110* 119*  BUN 24*  < > 17 15  --  9 5* 5*  CREATININE 1.79*  < >  1.03* 0.89  --  0.71 0.55 0.52  CALCIUM 7.9*  < > 7.9* 7.5*  --  7.9* 7.7* 8.2*  MG 2.1  --   --   --  1.4*  --   --  2.0  PHOS 6.0*  --   --   --  2.0*  --   --  3.8  < > = values in this interval not displayed. Liver Function Tests:  Recent Labs Lab 03/17/16 1320 03/17/16 1801  AST 23 21  ALT 19 17  ALKPHOS 149* 128*  BILITOT 1.5* 1.3*  PROT 6.9 5.9*  ALBUMIN 3.6 3.1*    Recent Labs Lab 03/17/16 1320 03/17/16 1801  LIPASE 27 73*  AMYLASE  --  80   No results for input(s): AMMONIA in the last 168 hours. CBC:  Recent Labs Lab 03/17/16 1345 03/17/16 1448 03/17/16 1801 03/18/16 0840 03/19/16 0508 03/20/16 0521  WBC 14.0*  --  19.9* 14.9* 9.7 5.5  NEUTROABS  --   --  17.1*  --  5.9  --   HGB 10.3* 13.3 8.8* 8.2* 7.9* 8.6*  HCT 36.4 39.0 28.7* 24.2* 24.0* 26.4*  MCV 95.0  --  90.0 82.3 80.3 83.5  PLT 412*  --  354 257 205 182   Cardiac Enzymes:  Recent Labs Lab 03/17/16 1801 03/17/16 2326 03/18/16 0538 03/18/16 0840  TROPONINI 0.03 0.09* 0.14* 0.12*   BNP: BNP (last 3 results)  Recent Labs  07/30/15 1400 03/17/16 1801  BNP 398.4* 95.3    ProBNP (last 3 results) No results for input(s): PROBNP in the last 8760 hours.  CBG:  Recent Labs Lab 03/19/16 1756 03/19/16 1821 03/19/16 1859 03/19/16 2147 03/20/16 0812  GLUCAP 58* 65 82 82 246*       Signed:  Bernabe Dorce A MD.  [facility] 03/20/2016, 11:24 AM

## 2016-03-20 NOTE — Telephone Encounter (Signed)
Ok to refill 

## 2016-03-20 NOTE — Progress Notes (Signed)
Pt discharged home. Medication regimen reviewed. Discharge instructions completed.  Pt verbalizes understanding.

## 2016-03-20 NOTE — Telephone Encounter (Signed)
Ok to refill for six months  

## 2016-03-20 NOTE — Progress Notes (Signed)
ANTICOAGULATION CONSULT NOTE   Pharmacy Consult for Warfarin Indication: Mechanical AVR  Allergies  Allergen Reactions  . Adhesive [Tape] Other (See Comments)    Can burn skin if left on too long  . Celebrex [Celecoxib] Rash  . Detrol [Tolterodine] Hives    Patient Measurements: Height: 5\' 3"  (160 cm) Weight: 150 lb 14.4 oz (68.448 kg) IBW/kg (Calculated) : 52.4  Vital Signs: Temp: 98.4 F (36.9 C) (05/02 0618) Temp Source: Oral (05/02 0618) BP: 136/77 mmHg (05/02 0618) Pulse Rate: 64 (05/02 0618)  Labs:  Recent Labs  03/17/16 1801  03/17/16 2326  03/18/16 0538 03/18/16 0840 03/18/16 1910 03/19/16 0508 03/20/16 0521  HGB 8.8*  --   --   --   --  8.2*  --  7.9* 8.6*  HCT 28.7*  --   --   --   --  24.2*  --  24.0* 26.4*  PLT 354  --   --   --   --  257  --  205 182  APTT 37  --   --   --   --   --   --   --   --   LABPROT 37.8*  --   --   --   --  49.0*  --  46.6* 30.0*  INR 3.97*  --   --   --   --  5.61*  --  5.24* 2.93*  CREATININE 1.79*  < >  --   < > 0.89  --  0.71 0.55 0.52  TROPONINI 0.03  --  0.09*  --  0.14* 0.12*  --   --   --   < > = values in this interval not displayed.  Estimated Creatinine Clearance: 73.8 mL/min (by C-G formula based on Cr of 0.52).   Medical History: Past Medical History  Diagnosis Date  . CAD (coronary artery disease)   . Obesity   . Hypercholesteremia   . HTN (hypertension)   . Dyslipidemia   . Aortic stenosis   . Thrombophlebitis   . Heart murmur   . Myocardial infarction (HCC) 12/2014  . Pneumonia 11/2014; 12/2014  . GERD (gastroesophageal reflux disease)   . 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 Anxiety   . Depression   . Allergy   . CHF (congestive heart failure) (HCC)   . Rheumatoid arthritis(714.0)     "hands" (07/01/2015)  . Diabetes (HCC) dx'd 1982    "went straight to insulin"    Medications:  Scheduled:  . insulin aspart  0-15  Units Subcutaneous TID WC  . insulin aspart  5 Units Subcutaneous TID WC  . insulin detemir  20 Units Subcutaneous Daily  . Warfarin - Pharmacist Dosing Inpatient   Does not apply q1800   Infusions:    PRN: sodium chloride, ondansetron (ZOFRAN) IV  Assessment: 55 year old female admitted 4/29 with altered mental status and DKA.  She takes chronic warfarin 7.5mg  daily for AVR (INR goal 2.5-3.5 per anticoag clinic), but patient has has several recent hospital admissions and pharmacy has recommended warfarin 5mg  daily d/t SUPRAtherapeutic INR. She was discharged 4/23 with INR 3.95 (and trending up), patient instructed to hold warfarin and f/u coumadin clinic 4/25. She was seen in coumadin clinic 4/25 and INR = 1.4. She was instructed to resumed warfarin 7.5mg  daily 4/25.  INR was supratherapeutic on admission 4/29.  Pharmacy is consulted  to manage dosing while inpatient.  Today, 03/20/2016  INR: now therapeutic after peaking at 5.6. No warfarin given inpatient thus far  CBC: Hgb improved, Plts WNL. No bleeding documented  Drug interactions: none  Diet: advance to carb modified; eating > 85% of meals  Goal of Therapy:  INR 2.5-3.5 per clinic notes   Plan:   Warfarin 7.5 mg today d/t rapid drop in INR.  Difficult to predict INR trajectory in this patient; may have to dose reactively rather than proactively  Daily PT/INR; CBC at least q72 hr while on warfarin  Monitor for signs/symptoms of bleeding  Bernadene Person, PharmD, BCPS Pager: [phone] 03/20/2016, 11:20 AM

## 2016-03-20 NOTE — Telephone Encounter (Signed)
Pt needs one touch aviva accu chek machine and test strips [facility]

## 2016-03-20 NOTE — Clinical Documentation Improvement (Signed)
Internal Medicine  Thank you for answering my previous query! This one is to clarify the type of Acute Encephalopathy documented in 03/19/16 progress note. Please document your findings in today's progress note.   Metabolic Encephalopathy secondary to patient's DKA  Toxic Encephalopathy secondary to non compliance with medication regime  Other Type of Encephalopathy  Clinically Undetermined  Supporting Information:  Please exercise your independent, professional judgment when responding. A specific answer is not anticipated or expected.  Thank You, Shellee Milo RN, BSN, CCDS Health Information Management [place] [phone]; Cell: [phone]

## 2016-03-21 DIAGNOSIS — M0600 Rheumatoid arthritis without rheumatoid factor, unspecified site: Secondary | ICD-10-CM | POA: Diagnosis not present

## 2016-03-21 DIAGNOSIS — I350 Nonrheumatic aortic (valve) stenosis: Secondary | ICD-10-CM | POA: Diagnosis not present

## 2016-03-21 DIAGNOSIS — D649 Anemia, unspecified: Secondary | ICD-10-CM | POA: Diagnosis not present

## 2016-03-21 DIAGNOSIS — I110 Hypertensive heart disease with heart failure: Secondary | ICD-10-CM | POA: Diagnosis not present

## 2016-03-21 DIAGNOSIS — E109 Type 1 diabetes mellitus without complications: Secondary | ICD-10-CM | POA: Diagnosis not present

## 2016-03-21 DIAGNOSIS — E785 Hyperlipidemia, unspecified: Secondary | ICD-10-CM | POA: Diagnosis not present

## 2016-03-21 DIAGNOSIS — I5042 Chronic combined systolic (congestive) and diastolic (congestive) heart failure: Secondary | ICD-10-CM | POA: Diagnosis not present

## 2016-03-21 DIAGNOSIS — R1314 Dysphagia, pharyngoesophageal phase: Secondary | ICD-10-CM | POA: Diagnosis not present

## 2016-03-21 DIAGNOSIS — I2510 Atherosclerotic heart disease of native coronary artery without angina pectoris: Secondary | ICD-10-CM | POA: Diagnosis not present

## 2016-03-22 ENCOUNTER — Telehealth: Payer: Self-pay | Admitting: Adult Health

## 2016-03-22 ENCOUNTER — Other Ambulatory Visit: Payer: Self-pay

## 2016-03-22 LAB — CULTURE, BLOOD (ROUTINE X 2)
CULTURE: NO GROWTH
Culture: NO GROWTH

## 2016-03-22 NOTE — Telephone Encounter (Signed)
Pt no longer want to see dr Everardo All endocrinologist. Pt did not elaborate the reason. Pt is requesting to see dr Sharl Ma [phone]. Pt has Armed forces logistics/support/administrative officer

## 2016-03-22 NOTE — Patient Outreach (Addendum)
Post acute care home visit for community care coordination, assessment of of community care needs. Patient was at home with home health aide assisting with cleaning the apartment. Patient reported to this RNCM she had been throwing up, however she did not throw up while this RNCM was present.  Patient was able to eat a few pieces of watermelon. This RNCM assisted patient to perform a glucometer, result was 482. This RNCM checked all of patient's medications, all are present and are within date.  Call made to primary care physician's office to request a referral to an area endocrinologist.  Patient states she does not want and will not go back to the hospital.    Call made to [facility] to make a referral. Spoke with Anibal Henderson, [facility] [phone]) to make referral.  Revision of Case Management Care Plan with emphasis of what her case management needs.  Plan: Home visit next week

## 2016-03-22 NOTE — Telephone Encounter (Signed)
See below

## 2016-03-23 ENCOUNTER — Telehealth: Payer: Self-pay | Admitting: Adult Health

## 2016-03-23 DIAGNOSIS — E119 Type 2 diabetes mellitus without complications: Secondary | ICD-10-CM | POA: Diagnosis not present

## 2016-03-23 NOTE — Telephone Encounter (Signed)
Noted  

## 2016-03-23 NOTE — Telephone Encounter (Signed)
Pt is set up on 03/28/16 for post hospital follow up.  Advised that I have talked to [facility] and [employer] and everyone is concerned due to slurred/garbled speech and would like for her to visit ED.  Pt refused ED.  Will come in for post hospital visit.

## 2016-03-23 NOTE — Telephone Encounter (Signed)
Patient Name: [name] DOB: May 12, 1961 Initial Comment Caller states pt has slurred speech, blood sugar 240. Was discharged 3 days ago. Vomiting Nurse Assessment Nurse: Charna Elizabeth, RN, Lynden Ang Date/Time (Eastern Time): 03/23/2016 9:20:10 AM Confirm and document reason for call. If symptomatic, describe symptoms. You must click the next button to save text entered. ---Crystal with [employer], [phone] states that Madison [name] is not present. She states Madison [name] developed vomiting and slurred speech this morning and Madison [name] had a blood sugar of 240 when she spoke with Madison [name] this morning. She is unable to connect [place] on the line with [place]. Transferred Crystal to [employer] at the office backline to notify MD or MD's Nurse. Has the patient traveled out of the country within the last 30 days? ---Not Applicable Does the patient have any new or worsening symptoms? ---Yes Will a triage be completed? ---No Select reason for no triage. ---Other Guidelines Guideline Title Affirmed Question Affirmed Notes Final Disposition User Clinical Call [place], RN, Lynden Ang

## 2016-03-23 NOTE — Telephone Encounter (Signed)
Spoke to the pt.  Speech is slurred.  She stated she vomited this morning and blood sugar was 200 or more (could not understand). I asked if she was using her insulin and she stated that she was.   I asked the pt multiple times if she is feeling okay and she stated that she is.  Also stated that someone is on the way.

## 2016-03-23 NOTE — Telephone Encounter (Signed)
Left a message for Crystal to call back.  

## 2016-03-23 NOTE — Telephone Encounter (Signed)
Spoke to [employer].  She spoke to the pt this morning and confirmed that speech was garbled and slurred.  Pt told Crystal that she did not feel well but told me she felt fine.  Pt is in need of hospital follow up.  When admitted to the hospital her bg was 1187 and 174 when released.  This morning pt reported 240 to Crystal.  Pt now has a new machine and is testing glucose.  Also taking her medication.  Please advise.

## 2016-03-23 NOTE — Telephone Encounter (Signed)
Pt scheduled  

## 2016-03-23 NOTE — Telephone Encounter (Signed)
If her blood sugars are in the 200's that is fine but if she is having slurred or gargled speech then she needs to go to the ER

## 2016-03-23 NOTE — Telephone Encounter (Signed)
Adella Nissen, RN  Dc 03/20/16 from the hospital.  At admitting blood sugar was 1187.  This morning pt has slurred speech, vomiting and states she does not feel good.  Blood sugar today 240 pt states she has picked up her new glucose meter and has checked her blood sugar.  Home Health aide on the way and pt does not know the number of the aide.  Pt need to have an post hosp appointment.

## 2016-03-23 NOTE — Telephone Encounter (Signed)
Will route to PCP as FYI. 

## 2016-03-24 ENCOUNTER — Encounter (HOSPITAL_COMMUNITY): Payer: Self-pay | Admitting: Emergency Medicine

## 2016-03-24 ENCOUNTER — Emergency Department (HOSPITAL_COMMUNITY): Payer: Commercial Managed Care - HMO

## 2016-03-24 ENCOUNTER — Emergency Department (HOSPITAL_COMMUNITY)
Admission: EM | Admit: 2016-03-24 | Discharge: 2016-03-24 | Disposition: A | Discharge status: Home / Self Care | Payer: Commercial Managed Care - HMO | Source: Home / Self Care | Attending: Emergency Medicine | Admitting: Emergency Medicine

## 2016-03-24 DIAGNOSIS — M069 Rheumatoid arthritis, unspecified: Secondary | ICD-10-CM | POA: Insufficient documentation

## 2016-03-24 DIAGNOSIS — Z951 Presence of aortocoronary bypass graft: Secondary | ICD-10-CM | POA: Insufficient documentation

## 2016-03-24 DIAGNOSIS — E109 Type 1 diabetes mellitus without complications: Secondary | ICD-10-CM

## 2016-03-24 DIAGNOSIS — Z955 Presence of coronary angioplasty implant and graft: Secondary | ICD-10-CM

## 2016-03-24 DIAGNOSIS — I350 Nonrheumatic aortic (valve) stenosis: Secondary | ICD-10-CM | POA: Diagnosis not present

## 2016-03-24 DIAGNOSIS — I110 Hypertensive heart disease with heart failure: Secondary | ICD-10-CM | POA: Insufficient documentation

## 2016-03-24 DIAGNOSIS — R791 Abnormal coagulation profile: Secondary | ICD-10-CM

## 2016-03-24 DIAGNOSIS — Z7901 Long term (current) use of anticoagulants: Secondary | ICD-10-CM

## 2016-03-24 DIAGNOSIS — K219 Gastro-esophageal reflux disease without esophagitis: Secondary | ICD-10-CM | POA: Diagnosis not present

## 2016-03-24 DIAGNOSIS — I252 Old myocardial infarction: Secondary | ICD-10-CM | POA: Insufficient documentation

## 2016-03-24 DIAGNOSIS — F329 Major depressive disorder, single episode, unspecified: Secondary | ICD-10-CM

## 2016-03-24 DIAGNOSIS — E7800 Pure hypercholesterolemia, unspecified: Secondary | ICD-10-CM

## 2016-03-24 DIAGNOSIS — K760 Fatty (change of) liver, not elsewhere classified: Secondary | ICD-10-CM | POA: Diagnosis not present

## 2016-03-24 DIAGNOSIS — D649 Anemia, unspecified: Secondary | ICD-10-CM | POA: Diagnosis not present

## 2016-03-24 DIAGNOSIS — I509 Heart failure, unspecified: Secondary | ICD-10-CM

## 2016-03-24 DIAGNOSIS — Z79899 Other long term (current) drug therapy: Secondary | ICD-10-CM

## 2016-03-24 DIAGNOSIS — I5042 Chronic combined systolic (congestive) and diastolic (congestive) heart failure: Secondary | ICD-10-CM | POA: Diagnosis not present

## 2016-03-24 DIAGNOSIS — E669 Obesity, unspecified: Secondary | ICD-10-CM

## 2016-03-24 DIAGNOSIS — M0600 Rheumatoid arthritis without rheumatoid factor, unspecified site: Secondary | ICD-10-CM | POA: Diagnosis not present

## 2016-03-24 DIAGNOSIS — I2510 Atherosclerotic heart disease of native coronary artery without angina pectoris: Secondary | ICD-10-CM | POA: Insufficient documentation

## 2016-03-24 DIAGNOSIS — R1033 Periumbilical pain: Secondary | ICD-10-CM | POA: Insufficient documentation

## 2016-03-24 DIAGNOSIS — E785 Hyperlipidemia, unspecified: Secondary | ICD-10-CM | POA: Diagnosis not present

## 2016-03-24 DIAGNOSIS — R112 Nausea with vomiting, unspecified: Secondary | ICD-10-CM | POA: Diagnosis not present

## 2016-03-24 DIAGNOSIS — R1314 Dysphagia, pharyngoesophageal phase: Secondary | ICD-10-CM | POA: Diagnosis not present

## 2016-03-24 LAB — CBC
HCT: 32.1 % — ABNORMAL LOW (ref 36.0–46.0)
Hemoglobin: 10.5 g/dL — ABNORMAL LOW (ref 12.0–15.0)
MCH: 27.3 pg (ref 26.0–34.0)
MCHC: 32.7 g/dL (ref 30.0–36.0)
MCV: 83.6 fL (ref 78.0–100.0)
Platelets: 358 10*3/uL (ref 150–400)
RBC: 3.84 MIL/uL — ABNORMAL LOW (ref 3.87–5.11)
RDW: 20.2 % — AB (ref 11.5–15.5)
WBC: 16 10*3/uL — AB (ref 4.0–10.5)

## 2016-03-24 LAB — COMPREHENSIVE METABOLIC PANEL
ALK PHOS: 136 U/L — AB (ref 38–126)
ALT: 16 U/L (ref 14–54)
ANION GAP: 14 (ref 5–15)
AST: 17 U/L (ref 15–41)
Albumin: 3.7 g/dL (ref 3.5–5.0)
BUN: 13 mg/dL (ref 6–20)
CALCIUM: 9 mg/dL (ref 8.9–10.3)
CO2: 20 mmol/L — ABNORMAL LOW (ref 22–32)
Chloride: 98 mmol/L — ABNORMAL LOW (ref 101–111)
Creatinine, Ser: 0.86 mg/dL (ref 0.44–1.00)
GFR calc Af Amer: >60 mL/min (ref >60)
GFR calc non Af Amer: >60 mL/min (ref >60)
GLUCOSE: 89 mg/dL (ref 65–99)
POTASSIUM: 4.3 mmol/L (ref 3.5–5.1)
Sodium: 132 mmol/L — ABNORMAL LOW (ref 135–145)
TOTAL PROTEIN: 6.8 g/dL (ref 6.5–8.1)
Total Bilirubin: 1.3 mg/dL — ABNORMAL HIGH (ref 0.3–1.2)

## 2016-03-24 LAB — LIPASE, BLOOD: Lipase: 48 U/L (ref 11–51)

## 2016-03-24 LAB — URINALYSIS, ROUTINE W REFLEX MICROSCOPIC
Glucose, UA: 100 mg/dL — AB
Hgb urine dipstick: NEGATIVE
LEUKOCYTES UA: NEGATIVE
NITRITE: NEGATIVE
PROTEIN: NEGATIVE mg/dL
Specific Gravity, Urine: 1.025 (ref 1.005–1.030)
pH: 6 (ref 5.0–8.0)

## 2016-03-24 LAB — PROTIME-INR
INR: 5.18 — AB (ref 0.00–1.49)
PROTHROMBIN TIME: 44.8 s — AB (ref 11.6–15.2)

## 2016-03-24 LAB — POC OCCULT BLOOD, ED: FECAL OCCULT BLD: NEGATIVE

## 2016-03-24 MED ORDER — IOPAMIDOL (ISOVUE-300) INJECTION 61%
100.0000 mL | Freq: Once | INTRAVENOUS | Status: AC | PRN
  Administered 2016-03-24: 100 mL via INTRAVENOUS

## 2016-03-24 MED ORDER — DIATRIZOATE MEGLUMINE & SODIUM 66-10 % PO SOLN
15.0000 mL | Freq: Once | ORAL | Status: AC
  Administered 2016-03-24: 15 mL via ORAL

## 2016-03-24 MED ORDER — MORPHINE SULFATE (PF) 4 MG/ML IV SOLN
4.0000 mg | Freq: Once | INTRAVENOUS | Status: AC
  Administered 2016-03-24: 4 mg via INTRAVENOUS
  Filled 2016-03-24: qty 1

## 2016-03-24 MED ORDER — ONDANSETRON 8 MG PO TBDP: ORAL_TABLET | ORAL | Status: DC

## 2016-03-24 MED ORDER — ONDANSETRON HCL 4 MG/2ML IJ SOLN
4.0000 mg | Freq: Once | INTRAMUSCULAR | Status: AC | PRN
  Administered 2016-03-24: 4 mg via INTRAVENOUS
  Filled 2016-03-24: qty 2

## 2016-03-24 NOTE — ED Notes (Signed)
Pt's sister,  [name], was called and notified of pt's discharge---- stated she will be here at around 0700 to pick up pt.

## 2016-03-24 NOTE — ED Notes (Signed)
Bed: QQ76 Expected date:  Expected time:  Means of arrival:  Comments: hyperglycemcia

## 2016-03-24 NOTE — ED Notes (Signed)
Brought in by EMS from home with c/o abdominal pain.  Pt reports that she has been having lower abdominal pain with nausea and vomiting for the past 4 days.  Pt denies diarrhea.  Hx of DM---- pt's CBG = 84 by EMS.

## 2016-03-24 NOTE — ED Provider Notes (Signed)
CSN: [id number]     Arrival date & time 03/24/16  0048 History   First MD Initiated Contact with Patient 03/24/16 0147     Chief Complaint  Patient presents with  . Abdominal Pain     (Consider location/radiation/quality/duration/timing/severity/associated sxs/prior Treatment) The history is provided by the patient and medical records. No language interpreter was used.     Madison [name] is a 55 y.o. female  with a hx of CAD, MI (12/2014), IDDM, CHF, arthritis, HTN, aortic stenosis (on coumadin), obesity presents to the Emergency Department complaining of gradual, persistent, progressively worsening periumbilical pain onset 4 days ago. Associated symptoms include Multiple bouts of emesis each day for the last 4 nights.  Pt reports she gets these symptoms when she is in DKA and her CBG is high, but her CBG has been WNL for the last few days.  Pt reports no solid food intake due to pain but she is able to tolerate water intermittently without difficulty.  She reports normal BMs; denies diarrhea.  She denies melena, hematochezia, hematemesis.  She reports taking all of her medications as directed.    Nothing makes her symptoms better or worse.  Pt denies fever, chills, headache, neck pain, chest pain, SOB, weakness, dizziness, syncope.  Denies recent illness or abd surgeries.  Denies recent travel.      Past Medical History  Diagnosis Date  . CAD (coronary artery disease)   . Obesity   . Hypercholesteremia   . HTN (hypertension)   . Dyslipidemia   . Aortic stenosis   . Thrombophlebitis   . Heart murmur   . Myocardial infarction ([place]) 12/2014  . Pneumonia 11/2014; 12/2014  . GERD (gastroesophageal reflux disease)   . Stroke syndrome [facility]) 1995; 2001    "when my son was born; problems w/speech and L hand since then" (01/19/2015)  . Anxiety   . Depression   . Allergy   . CHF (congestive heart failure) ([place])   . Rheumatoid arthritis(714.0)     "hands" (07/01/2015)  . Diabetes ([place]) dx'd 1982     "went straight to insulin"   Past Surgical History  Procedure Laterality Date  . Cesarean section  1995  . Foot fracture surgery    . Knee arthroscopy Right   . Neuroplasty / transposition median nerve at carpal tunnel    . Left heart catheterization with coronary angiogram N/A 12/08/2014    Procedure: LEFT HEART CATHETERIZATION WITH CORONARY ANGIOGRAM;  Surgeon: Wellington Hampshire, MD;  Location: [facility] CATH LAB;  Service: Cardiovascular;  Laterality: N/A;  . Percutaneous coronary stent intervention (pci-s) N/A 12/09/2014    Procedure: PERCUTANEOUS CORONARY STENT INTERVENTION (PCI-S);  Surgeon: Leonie Man, MD;  Location: [facility] C[facility] LAB;  Service: Cardiovascular;  Laterality: N/A;  . Cardiac catheterization  12/08/2014  . Coronary angioplasty with stent placement  12/09/2014  . Aortic valve replacement (avr)/coronary artery bypass grafting (cabg)  "2014"    Archie Endo 03/08/2014  . Fracture surgery    . Tubal ligation  1995  . Cataract extraction Left   . Coronary artery bypass graft    . Orif ankle fracture Left 07/04/2015    Procedure: OPEN REDUCTION INTERNAL FIXATION (ORIF)  TRIMAL ANKLE FRACTURE;  Surgeon: Leandrew Koyanagi, MD;  Location: [place];  Service: Orthopedics;  Laterality: Left;   Family History  Problem Relation Age of Onset  . Stroke    . Heart disease Mother   . Heart disease Sister    Social History  Substance  Use Topics  . Smoking status: Never Smoker   . Smokeless tobacco: Never Used  . Alcohol Use: No   OB History    No data available     Review of Systems  Constitutional: Negative for fever, diaphoresis, appetite change, fatigue and unexpected weight change.  HENT: Negative for mouth sores.   Eyes: Negative for visual disturbance.  Respiratory: Negative for cough, chest tightness, shortness of breath and wheezing.   Cardiovascular: Negative for chest pain.  Gastrointestinal: Positive for nausea, vomiting and abdominal pain (generalized). Negative for diarrhea and  constipation.  Endocrine: Negative for polydipsia, polyphagia and polyuria.  Genitourinary: Negative for dysuria, urgency, frequency and hematuria.  Musculoskeletal: Negative for back pain and neck stiffness.  Skin: Negative for rash.  Allergic/Immunologic: Negative for immunocompromised state.  Neurological: Negative for syncope, light-headedness and headaches.  Hematological: Does not bruise/bleed easily.  Psychiatric/Behavioral: Negative for sleep disturbance. The patient is not nervous/anxious.       Allergies  Adhesive; Celebrex; and Detrol  Home Medications   Prior to Admission medications   Medication Sig Start Date End Date Taking? Authorizing Provider  Blood Glucose Monitoring Suppl ([place]) w/Device KIT Use to check glucose 2x per day 03/16/16  Yes Dorothyann Peng, NP  citalopram (CELEXA) 40 MG tablet Take 1 tablet (40 mg total) by mouth daily. 03/08/16  Yes Dorothyann Peng, NP  glucose blood test strip Use as instructed 03/16/16  Yes Dorothyann Peng, NP  insulin aspart (NOVOLOG) 100 UNIT/ML injection Inject 5 Units into the skin 3 (three) times daily with meals. 02/14/16  Yes Domenic Polite, MD  insulin detemir (LEVEMIR) 100 UNIT/ML injection Inject 0.25 mLs (25 Units total) into the skin daily. 03/13/16  Yes Dorothyann Peng, NP  lisinopril (PRINIVIL,ZESTRIL) 2.5 MG tablet Take 2.5 mg by mouth daily. 01/18/16  Yes Historical Provider, MD  omeprazole (PRILOSEC) 40 MG capsule TAKE ONE CAPSULE BY MOUTH EVERY DAY 03/20/16  Yes Dorothyann Peng, NP  ondansetron (ZOFRAN) 4 MG tablet Take 1 tablet (4 mg total) by mouth every 8 (eight) hours as needed for nausea or vomiting. 02/06/16  Yes Dorothyann Peng, NP  polyethylene glycol (MIRALAX / GLYCOLAX) packet Take 17 g by mouth 2 (two) times daily. 03/11/16  Yes Eugenie Filler, MD  pravastatin (PRAVACHOL) 20 MG tablet TAKE 1 TABLET BY MOUTH EVERY DAY 03/08/16  Yes Dorothyann Peng, NP  traZODone (DESYREL) 50 MG tablet Take 50 mg by mouth at  bedtime.  12/17/15  Yes Historical Provider, MD  warfarin (COUMADIN) 5 MG tablet Take 1 tablet (5 mg total) by mouth daily. If okay with coumadin clinic. Patient taking differently: Take 7.5 mg by mouth daily at 6 PM. If okay with coumadin clinic. 03/13/16  Yes Eugenie Filler, MD  ondansetron (ZOFRAN ODT) 8 MG disintegrating tablet 43m ODT q4 hours prn nausea 03/24/16   Hildred Pharo, PA-C   BP 149/70 mmHg  Pulse 87  Temp(Src) 99.7 F (37.6 C) (Oral)  Resp 19  SpO2 97% Physical Exam  Constitutional: She appears well-developed and well-nourished. No distress.  Awake, alert, nontoxic appearance  HENT:  Head: Normocephalic and atraumatic.  Mouth/Throat: Oropharynx is clear and moist. No oropharyngeal exudate.  Eyes: Conjunctivae are normal. No scleral icterus.  Unequal pupils and strabismus baseline per patient  Neck: Normal range of motion. Neck supple.  Cardiovascular: Normal rate, regular rhythm, normal heart sounds and intact distal pulses.   Pulmonary/Chest: Effort normal and breath sounds normal. No respiratory distress. She has no wheezes.  Equal chest expansion  Abdominal: Soft. Bowel sounds are normal. She exhibits no distension and no mass. There is generalized tenderness. There is guarding. There is no rebound and no CVA tenderness.  Generalized tenderness throughout, worst in the peri-umbilical region with some guarding  Musculoskeletal: Normal range of motion. She exhibits no edema.  Neurological: She is alert.  Speech is clear and goal oriented Moves extremities without ataxia  Skin: Skin is warm and dry. She is not diaphoretic.  Psychiatric: She has a normal mood and affect.  Nursing note and vitals reviewed.   ED Course  Procedures (including critical care time) Labs Review Labs Reviewed  COMPREHENSIVE METABOLIC PANEL - Abnormal; Notable for the following:    Sodium 132 (*)    Chloride 98 (*)    CO2 20 (*)    Alkaline Phosphatase 136 (*)    Total Bilirubin  1.3 (*)    All other components within normal limits  CBC - Abnormal; Notable for the following:    WBC 16.0 (*)    RBC 3.84 (*)    Hemoglobin 10.5 (*)    HCT 32.1 (*)    RDW 20.2 (*)    All other components within normal limits  URINALYSIS, ROUTINE W REFLEX MICROSCOPIC (NOT AT [facility]) - Abnormal; Notable for the following:    APPearance CLOUDY (*)    Glucose, UA 100 (*)    Bilirubin Urine LARGE (*)    Ketones, ur >80 (*)    All other components within normal limits  PROTIME-INR - Abnormal; Notable for the following:    Prothrombin Time 44.8 (*)    INR 5.18 (*)    All other components within normal limits  LIPASE, BLOOD  POC OCCULT BLOOD, ED    Imaging Review Ct Abdomen Pelvis W Contrast  03/24/2016  CLINICAL DATA:  Abdominal pain. Lower abdominal pain with nausea and vomiting for 4 days. EXAM: CT ABDOMEN AND PELVIS WITH CONTRAST TECHNIQUE: Multidetector CT imaging of the abdomen and pelvis was performed using the standard protocol following bolus administration of intravenous contrast. CONTRAST:  174m ISOVUE-300 IOPAMIDOL (ISOVUE-300) INJECTION 61% COMPARISON:  02/09/2016 FINDINGS: Mild pleural thickening in the right lung base. Wall thickening around the esophagus may indicate reflux disease. Esophageal mass is not entirely excluded. Consider endoscopy if clinically indicated. Mild diffuse fatty infiltration of the liver. The gallbladder, pancreas, spleen, adrenal glands, kidneys, abdominal aorta, inferior vena cava, and retroperitoneal lymph nodes are unremarkable. Stomach, small bowel, and colon are not abnormally distended. Stool fills the colon. No free air or free fluid in the abdomen. Small umbilical hernia containing fat. Pelvis: Appendix is not identified. Bladder wall is mildly thickened possibly indicating cystitis. Calcified exophytic uterine mass is consistent with calcified fibroids. No abnormal adnexal masses. No free or loculated pelvic fluid collections. No significant pelvic  lymphadenopathy. No destructive bone lesions. Compression of the superior endplate of TZ30is new since previous study and appears acute. There is about 20% loss of height. IMPRESSION: Prominent wall thickening around the distal esophagus may indicate reflux disease although esophageal mass is not entirely excluded. Mild diffuse fatty infiltration of the liver. Acute appearing anterior compression of the superior endplate of TQ65 Electronically Signed   By: WLucienne CapersM.D.   On: 03/24/2016 04:49   I have personally reviewed and evaluated these images and lab results as part of my medical decision-making.   MDM   Final diagnoses:  Periumbilical abdominal pain  Elevated INR  Anticoagulated on Coumadin  Gastroesophageal reflux  disease, esophagitis presence not specified   Madison [name] presents with abd pain.  Patient is nontoxic, nonseptic appearing, in no apparent distress.  Patient's pain and other symptoms adequately managed in emergency department.  Fluid bolus given.  Labs, imaging and vitals reviewed.  NO acute findings on CT scan.  Pt's INR is 5.18.  It has been elevated in the last week and was 5.24 on 5/1, but improved on 2.93 on 5/2.  Will have her stop coumadin until f/u on Monday. Fecal occult negative.  Pt has been given fluids as her urine has keytones.  No evidence of DKA today.  No hyperkalemia.  Patient does not meet the SIRS or Sepsis criteria.  On repeat exam patient does not have a surgical abdomin and there are no peritoneal signs.  No indication of appendicitis, bowel obstruction, bowel perforation, cholecystitis, diverticulitis.  Patient discharged home with symptomatic treatment and given strict instructions for follow-up with their primary care physician.  I have also discussed reasons to return immediately to the ER.  Patient expresses understanding and agrees with plan.  The patient was discussed with and seen by Dr. Tomi Bamberger who agrees with the treatment  plan.       Jarrett Soho Phoebe Marter, PA-C 03/24/16 0559  Dorie Rank, MD 03/24/16 [phone]

## 2016-03-24 NOTE — ED Notes (Signed)
Critical INR 5.18  Primary RN and MD notified.

## 2016-03-24 NOTE — ED Provider Notes (Signed)
Pt presented to the ED with complaints of abdominal pain and vomiting. Physical Exam  BP 149/70 mmHg  Pulse 87  Temp(Src) 99.7 F (37.6 C) (Oral)  Resp 19  SpO2 97%  Physical Exam  Constitutional: She appears well-developed and well-nourished. No distress.  HENT:  Head: Normocephalic and atraumatic.  Right Ear: External ear normal.  Left Ear: External ear normal.  Eyes: Conjunctivae are normal. Right eye exhibits no discharge. Left eye exhibits no discharge. No scleral icterus.  Neck: Neck supple. No tracheal deviation present.  Cardiovascular: Normal rate.   Pulmonary/Chest: Effort normal. No stridor. No respiratory distress.  Abdominal: Soft. She exhibits no distension. There is no tenderness. There is no rebound and no guarding.  Musculoskeletal: She exhibits no edema.  Neurological: She is alert. Cranial nerve deficit: no gross deficits.  Skin: Skin is warm and dry. No rash noted.  Psychiatric: She has a normal mood and affect.  Nursing note and vitals reviewed.   ED Course  Procedures  MDM Pt is feeling better after treatment.  Will have her hold her coumadin until Monday with her supratherapeutic dose.      Linwood Dibbles, MD 03/24/16 [phone]

## 2016-03-24 NOTE — Discharge Instructions (Signed)
1. Medications: zofran, usual home medications 2. Treatment: rest, drink plenty of fluids, advance diet slowly; STOP coumadin until recheck of INR on Monday and f/u with your doctor 3. Follow Up: Please followup with your primary doctor in 2 days for discussion of your diagnoses and further evaluation after today's visit; if you do not have a primary care doctor use the resource guide provided to find one; Please return to the ER for persistent vomiting, high fevers or worsening symptoms

## 2016-03-24 NOTE — ED Notes (Signed)
Pt was given ice water and saltine crackers for po challenge.  Pt denies nausea at the present.

## 2016-03-26 ENCOUNTER — Telehealth: Payer: Self-pay | Admitting: Adult Health

## 2016-03-26 ENCOUNTER — Encounter: Payer: Self-pay | Admitting: *Deleted

## 2016-03-26 ENCOUNTER — Inpatient Hospital Stay (HOSPITAL_COMMUNITY)
Admission: EM | Admit: 2016-03-26 | Discharge: 2016-04-03 | DRG: 638 | Disposition: A | Discharge status: Skilled Nursing Facility | Payer: Commercial Managed Care - HMO | Attending: Internal Medicine | Admitting: Internal Medicine

## 2016-03-26 ENCOUNTER — Other Ambulatory Visit: Payer: Self-pay

## 2016-03-26 ENCOUNTER — Ambulatory Visit: Payer: Commercial Managed Care - HMO

## 2016-03-26 ENCOUNTER — Other Ambulatory Visit: Payer: Self-pay | Admitting: *Deleted

## 2016-03-26 ENCOUNTER — Emergency Department (HOSPITAL_COMMUNITY): Payer: Commercial Managed Care - HMO

## 2016-03-26 DIAGNOSIS — Z952 Presence of prosthetic heart valve: Secondary | ICD-10-CM

## 2016-03-26 DIAGNOSIS — E785 Hyperlipidemia, unspecified: Secondary | ICD-10-CM | POA: Diagnosis not present

## 2016-03-26 DIAGNOSIS — Z794 Long term (current) use of insulin: Secondary | ICD-10-CM

## 2016-03-26 DIAGNOSIS — E1110 Type 2 diabetes mellitus with ketoacidosis without coma: Secondary | ICD-10-CM | POA: Diagnosis present

## 2016-03-26 DIAGNOSIS — R2681 Unsteadiness on feet: Secondary | ICD-10-CM | POA: Diagnosis not present

## 2016-03-26 DIAGNOSIS — E860 Dehydration: Secondary | ICD-10-CM | POA: Diagnosis not present

## 2016-03-26 DIAGNOSIS — K2970 Gastritis, unspecified, without bleeding: Secondary | ICD-10-CM | POA: Diagnosis not present

## 2016-03-26 DIAGNOSIS — R739 Hyperglycemia, unspecified: Secondary | ICD-10-CM | POA: Diagnosis not present

## 2016-03-26 DIAGNOSIS — R791 Abnormal coagulation profile: Secondary | ICD-10-CM | POA: Diagnosis not present

## 2016-03-26 DIAGNOSIS — G4700 Insomnia, unspecified: Secondary | ICD-10-CM | POA: Diagnosis present

## 2016-03-26 DIAGNOSIS — F419 Anxiety disorder, unspecified: Secondary | ICD-10-CM | POA: Diagnosis present

## 2016-03-26 DIAGNOSIS — I69354 Hemiplegia and hemiparesis following cerebral infarction affecting left non-dominant side: Secondary | ICD-10-CM | POA: Diagnosis not present

## 2016-03-26 DIAGNOSIS — Z951 Presence of aortocoronary bypass graft: Secondary | ICD-10-CM | POA: Diagnosis not present

## 2016-03-26 DIAGNOSIS — F331 Major depressive disorder, recurrent, moderate: Secondary | ICD-10-CM | POA: Diagnosis not present

## 2016-03-26 DIAGNOSIS — E871 Hypo-osmolality and hyponatremia: Secondary | ICD-10-CM | POA: Diagnosis present

## 2016-03-26 DIAGNOSIS — E1010 Type 1 diabetes mellitus with ketoacidosis without coma: Secondary | ICD-10-CM | POA: Diagnosis not present

## 2016-03-26 DIAGNOSIS — Z8249 Family history of ischemic heart disease and other diseases of the circulatory system: Secondary | ICD-10-CM | POA: Diagnosis not present

## 2016-03-26 DIAGNOSIS — I5042 Chronic combined systolic (congestive) and diastolic (congestive) heart failure: Secondary | ICD-10-CM | POA: Diagnosis not present

## 2016-03-26 DIAGNOSIS — Z79899 Other long term (current) drug therapy: Secondary | ICD-10-CM

## 2016-03-26 DIAGNOSIS — K219 Gastro-esophageal reflux disease without esophagitis: Secondary | ICD-10-CM | POA: Diagnosis present

## 2016-03-26 DIAGNOSIS — Z7901 Long term (current) use of anticoagulants: Secondary | ICD-10-CM

## 2016-03-26 DIAGNOSIS — E861 Hypovolemia: Secondary | ICD-10-CM | POA: Diagnosis present

## 2016-03-26 DIAGNOSIS — I959 Hypotension, unspecified: Secondary | ICD-10-CM | POA: Diagnosis not present

## 2016-03-26 DIAGNOSIS — I10 Essential (primary) hypertension: Secondary | ICD-10-CM | POA: Diagnosis not present

## 2016-03-26 DIAGNOSIS — D7589 Other specified diseases of blood and blood-forming organs: Secondary | ICD-10-CM | POA: Diagnosis present

## 2016-03-26 DIAGNOSIS — R1314 Dysphagia, pharyngoesophageal phase: Secondary | ICD-10-CM | POA: Diagnosis present

## 2016-03-26 DIAGNOSIS — I6930 Unspecified sequelae of cerebral infarction: Secondary | ICD-10-CM | POA: Diagnosis not present

## 2016-03-26 DIAGNOSIS — Z9114 Patient's other noncompliance with medication regimen: Secondary | ICD-10-CM

## 2016-03-26 DIAGNOSIS — E109 Type 1 diabetes mellitus without complications: Secondary | ICD-10-CM | POA: Diagnosis not present

## 2016-03-26 DIAGNOSIS — I252 Old myocardial infarction: Secondary | ICD-10-CM

## 2016-03-26 DIAGNOSIS — R112 Nausea with vomiting, unspecified: Secondary | ICD-10-CM | POA: Diagnosis not present

## 2016-03-26 DIAGNOSIS — F329 Major depressive disorder, single episode, unspecified: Secondary | ICD-10-CM | POA: Diagnosis present

## 2016-03-26 DIAGNOSIS — E1065 Type 1 diabetes mellitus with hyperglycemia: Secondary | ICD-10-CM | POA: Diagnosis not present

## 2016-03-26 DIAGNOSIS — N179 Acute kidney failure, unspecified: Secondary | ICD-10-CM | POA: Diagnosis not present

## 2016-03-26 DIAGNOSIS — F4323 Adjustment disorder with mixed anxiety and depressed mood: Secondary | ICD-10-CM | POA: Diagnosis present

## 2016-03-26 DIAGNOSIS — Z5181 Encounter for therapeutic drug level monitoring: Secondary | ICD-10-CM | POA: Diagnosis not present

## 2016-03-26 DIAGNOSIS — I2510 Atherosclerotic heart disease of native coronary artery without angina pectoris: Secondary | ICD-10-CM | POA: Diagnosis present

## 2016-03-26 DIAGNOSIS — M6281 Muscle weakness (generalized): Secondary | ICD-10-CM | POA: Diagnosis not present

## 2016-03-26 DIAGNOSIS — E1310 Other specified diabetes mellitus with ketoacidosis without coma: Secondary | ICD-10-CM | POA: Diagnosis not present

## 2016-03-26 DIAGNOSIS — E108 Type 1 diabetes mellitus with unspecified complications: Secondary | ICD-10-CM | POA: Diagnosis not present

## 2016-03-26 DIAGNOSIS — Z955 Presence of coronary angioplasty implant and graft: Secondary | ICD-10-CM

## 2016-03-26 DIAGNOSIS — I110 Hypertensive heart disease with heart failure: Secondary | ICD-10-CM | POA: Diagnosis present

## 2016-03-26 LAB — COMPREHENSIVE METABOLIC PANEL
ALBUMIN: 4.1 g/dL (ref 3.5–5.0)
ALK PHOS: 172 U/L — AB (ref 38–126)
ALT: 22 U/L (ref 14–54)
ANION GAP: 28 — AB (ref 5–15)
AST: 27 U/L (ref 15–41)
BUN: 17 mg/dL (ref 6–20)
CALCIUM: 9.1 mg/dL (ref 8.9–10.3)
CHLORIDE: 90 mmol/L — AB (ref 101–111)
CO2: 9 mmol/L — AB (ref 22–32)
CREATININE: 1.5 mg/dL — AB (ref 0.44–1.00)
GFR calc non Af Amer: 38 mL/min — ABNORMAL LOW (ref >60)
GFR, EST AFRICAN AMERICAN: 44 mL/min — AB (ref >60)
GLUCOSE: 673 mg/dL — AB (ref 65–99)
Potassium: 4.7 mmol/L (ref 3.5–5.1)
SODIUM: 127 mmol/L — AB (ref 135–145)
Total Bilirubin: 1.9 mg/dL — ABNORMAL HIGH (ref 0.3–1.2)
Total Protein: 7.8 g/dL (ref 6.5–8.1)

## 2016-03-26 LAB — BASIC METABOLIC PANEL
Anion gap: 23 — ABNORMAL HIGH (ref 5–15)
BUN: 14 mg/dL (ref 6–20)
CALCIUM: 8.6 mg/dL — AB (ref 8.9–10.3)
CHLORIDE: 104 mmol/L (ref 101–111)
CO2: 8 mmol/L — ABNORMAL LOW (ref 22–32)
CREATININE: 1.26 mg/dL — AB (ref 0.44–1.00)
GFR, EST AFRICAN AMERICAN: 55 mL/min — AB (ref >60)
GFR, EST NON AFRICAN AMERICAN: 47 mL/min — AB (ref >60)
Glucose, Bld: 435 mg/dL — ABNORMAL HIGH (ref 65–99)
Potassium: 4.3 mmol/L (ref 3.5–5.1)
SODIUM: 135 mmol/L (ref 135–145)

## 2016-03-26 LAB — RAPID URINE DRUG SCREEN, HOSP PERFORMED
Amphetamines: NOT DETECTED
Barbiturates: NOT DETECTED
Benzodiazepines: NOT DETECTED
Cocaine: NOT DETECTED
OPIATES: NOT DETECTED
Tetrahydrocannabinol: NOT DETECTED

## 2016-03-26 LAB — URINALYSIS, ROUTINE W REFLEX MICROSCOPIC
Bilirubin Urine: NEGATIVE
Glucose, UA: >1000 mg/dL — AB
Hgb urine dipstick: NEGATIVE
Ketones, ur: >80 mg/dL — AB
LEUKOCYTES UA: NEGATIVE
Nitrite: NEGATIVE
PH: 5 (ref 5.0–8.0)
Protein, ur: NEGATIVE mg/dL
Specific Gravity, Urine: 1.026 (ref 1.005–1.030)

## 2016-03-26 LAB — GLUCOSE, CAPILLARY
GLUCOSE-CAPILLARY: 253 mg/dL — AB (ref 65–99)
GLUCOSE-CAPILLARY: 170 mg/dL — AB (ref 65–99)

## 2016-03-26 LAB — CBG MONITORING, ED
GLUCOSE-CAPILLARY: 461 mg/dL — AB (ref 65–99)
GLUCOSE-CAPILLARY: 523 mg/dL — AB (ref 65–99)
GLUCOSE-CAPILLARY: 344 mg/dL — AB (ref 65–99)
Glucose-Capillary: >600 mg/dL (ref 65–99)

## 2016-03-26 LAB — BLOOD GAS, VENOUS
Acid-base deficit: 16.1 mmol/L — ABNORMAL HIGH (ref 0.0–2.0)
BICARBONATE: 10.7 meq/L — AB (ref 20.0–24.0)
O2 Saturation: 55 %
PATIENT TEMPERATURE: 98.6
PO2 VEN: 37 mmHg (ref 31.0–45.0)
TCO2: 9.3 mmol/L (ref 0–100)
pCO2, Ven: 27.4 mmHg — ABNORMAL LOW (ref 45.0–50.0)
pH, Ven: 7.218 — ABNORMAL LOW (ref 7.250–7.300)

## 2016-03-26 LAB — PROTIME-INR
INR: 5.85 — AB (ref 0.00–1.49)
Prothrombin Time: 49.1 seconds — ABNORMAL HIGH (ref 11.6–15.2)

## 2016-03-26 LAB — URINE MICROSCOPIC-ADD ON
BACTERIA UA: NONE SEEN
RBC / HPF: NONE SEEN RBC/hpf (ref 0–5)
WBC, UA: NONE SEEN WBC/hpf (ref 0–5)

## 2016-03-26 LAB — CBC
HCT: 38.5 % (ref 36.0–46.0)
Hemoglobin: 12 g/dL (ref 12.0–15.0)
MCH: 27.6 pg (ref 26.0–34.0)
MCHC: 31.2 g/dL (ref 30.0–36.0)
MCV: 88.7 fL (ref 78.0–100.0)
PLATELETS: 439 10*3/uL — AB (ref 150–400)
RBC: 4.34 MIL/uL (ref 3.87–5.11)
RDW: 20 % — ABNORMAL HIGH (ref 11.5–15.5)
WBC: 9.5 10*3/uL (ref 4.0–10.5)

## 2016-03-26 LAB — LIPASE, BLOOD: Lipase: 19 U/L (ref 11–51)

## 2016-03-26 MED ORDER — HYDROCODONE-ACETAMINOPHEN 5-325 MG PO TABS
1.0000 | ORAL_TABLET | ORAL | Status: DC | PRN
  Administered 2016-03-26 – 2016-04-02 (×12): 1 via ORAL
  Filled 2016-03-26 (×12): qty 1

## 2016-03-26 MED ORDER — SODIUM CHLORIDE 0.9 % IV SOLN
INTRAVENOUS | Status: DC
  Administered 2016-03-26: 4 [IU]/h via INTRAVENOUS
  Filled 2016-03-26: qty 2.5

## 2016-03-26 MED ORDER — SODIUM CHLORIDE 0.9 % IV BOLUS (SEPSIS)
30.0000 mL/kg | Freq: Once | INTRAVENOUS | Status: AC
  Administered 2016-03-26: 2052 mL via INTRAVENOUS

## 2016-03-26 MED ORDER — DEXTROSE-NACL 5-0.45 % IV SOLN
INTRAVENOUS | Status: DC
  Administered 2016-03-26: 23:00:00 via INTRAVENOUS

## 2016-03-26 MED ORDER — DEXTROSE 50 % IV SOLN: 25.0000 mL | INTRAVENOUS | Status: DC | PRN

## 2016-03-26 MED ORDER — SODIUM CHLORIDE 0.9 % IV SOLN
INTRAVENOUS | Status: DC
  Administered 2016-03-26: 22:00:00 via INTRAVENOUS

## 2016-03-26 MED ORDER — ONDANSETRON HCL 4 MG/2ML IJ SOLN
4.0000 mg | Freq: Four times a day (QID) | INTRAMUSCULAR | Status: DC | PRN
  Administered 2016-03-26: 4 mg via INTRAVENOUS
  Filled 2016-03-26: qty 2

## 2016-03-26 MED ORDER — INSULIN REGULAR BOLUS VIA INFUSION
0.0000 [IU] | Freq: Three times a day (TID) | INTRAVENOUS | Status: DC
Start: 2016-03-27 — End: 2016-03-27
  Filled 2016-03-26: qty 10

## 2016-03-26 MED ORDER — SODIUM CHLORIDE 0.9% FLUSH
3.0000 mL | Freq: Two times a day (BID) | INTRAVENOUS | Status: DC
  Administered 2016-03-26 – 2016-03-31 (×10): 3 mL via INTRAVENOUS

## 2016-03-26 MED ORDER — ALBUTEROL SULFATE (2.5 MG/3ML) 0.083% IN NEBU: 2.5000 mg | INHALATION_SOLUTION | RESPIRATORY_TRACT | Status: DC | PRN

## 2016-03-26 MED ORDER — PRAVASTATIN SODIUM 20 MG PO TABS
20.0000 mg | ORAL_TABLET | Freq: Every day | ORAL | Status: DC
  Administered 2016-03-27 – 2016-04-03 (×8): 20 mg via ORAL
  Filled 2016-03-26 (×8): qty 1

## 2016-03-26 MED ORDER — INSULIN REGULAR HUMAN 100 UNIT/ML IJ SOLN: INTRAMUSCULAR | Status: DC

## 2016-03-26 MED ORDER — ONDANSETRON HCL 4 MG/2ML IJ SOLN
4.0000 mg | Freq: Once | INTRAMUSCULAR | Status: AC
  Administered 2016-03-26: 4 mg via INTRAVENOUS
  Filled 2016-03-26: qty 2

## 2016-03-26 MED ORDER — TRAZODONE HCL 50 MG PO TABS
50.0000 mg | ORAL_TABLET | Freq: Every day | ORAL | Status: DC
  Administered 2016-03-26 – 2016-04-02 (×8): 50 mg via ORAL
  Filled 2016-03-26 (×12): qty 1

## 2016-03-26 MED ORDER — ONDANSETRON HCL 4 MG PO TABS: 4.0000 mg | ORAL_TABLET | Freq: Four times a day (QID) | ORAL | Status: DC | PRN

## 2016-03-26 MED ORDER — CITALOPRAM HYDROBROMIDE 40 MG PO TABS
40.0000 mg | ORAL_TABLET | Freq: Every day | ORAL | Status: DC
  Administered 2016-03-26 – 2016-04-03 (×9): 40 mg via ORAL
  Filled 2016-03-26 (×9): qty 1

## 2016-03-26 MED ORDER — PANTOPRAZOLE SODIUM 40 MG IV SOLR
40.0000 mg | Freq: Two times a day (BID) | INTRAVENOUS | Status: DC
  Administered 2016-03-26 – 2016-03-29 (×6): 40 mg via INTRAVENOUS
  Filled 2016-03-26 (×8): qty 40

## 2016-03-26 MED ORDER — DEXTROSE-NACL 5-0.45 % IV SOLN: INTRAVENOUS | Status: DC

## 2016-03-26 NOTE — Progress Notes (Signed)
ANTICOAGULATION CONSULT NOTE - Initial Consult  Pharmacy Consult for warfarin Indication: aortic valve replacement  Allergies  Allergen Reactions  . Adhesive [Tape] Other (See Comments)    Can burn skin if left on too long  . Celebrex [Celecoxib] Rash  . Detrol [Tolterodine] Hives    Patient Measurements: Height: 5\' 3"  (160 cm) Weight: 140 lb (63.504 kg) IBW/kg (Calculated) : 52.4   Vital Signs: Temp: 98.6 F (37 C) (05/08 1540) Temp Source: Rectal (05/08 1540) BP: 128/59 mmHg (05/08 1930) Pulse Rate: 114 (05/08 1930)  Labs:  Recent Labs  03/24/16 0139 03/26/16 1334  HGB 10.5* 12.0  HCT 32.1* 38.5  PLT 358 439*  LABPROT 44.8* 49.1*  INR 5.18* 5.85*  CREATININE 0.86 1.50*    Estimated Creatinine Clearance: 38 mL/min (by C-G formula based on Cr of 1.5).   Medical History: Past Medical History  Diagnosis Date  . CAD (coronary artery disease)   . Obesity   . Hypercholesteremia   . HTN (hypertension)   . Dyslipidemia   . Aortic stenosis   . Thrombophlebitis   . Heart murmur   . Myocardial infarction ([facility]) 12/2014  . Pneumonia 11/2014; 12/2014  . GERD (gastroesophageal reflux disease)   . 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 Anxiety   . Depression   . Allergy   . CHF (congestive heart failure) ([facility])   . Rheumatoid arthritis(714.0)     "hands" (07/01/2015)  . Diabetes ([facility]) dx'd 1982    "went straight to insulin"     Assessment: 55 y.o. 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  Pharmacy consulted to dose warfarin. Pt takes warfarin 7.5mg  daily.  03/26/2016 INR 5.85 H/H WNL Plts 439  Goal of Therapy:  INR 2.5-3.5  Plan:  Hold warfarin tonight Check daily PT/INR  05/26/2016  RPh 03/26/2016, 8:37 PM Pager [phone]

## 2016-03-26 NOTE — ED Notes (Signed)
Per EMS, pt complains of nausea, vomiting, hyperglycemia for 3 days. Pt denies diarrhea. Pt states she had generalized abdominal pain after vomiting. Pt's CBG >600. Pt sinus tach for EMS. Pt has hx of insulin dependent DM.

## 2016-03-26 NOTE — H&P (Addendum)
History and Physical    Madison [name]:[id number] DOB: [birth year] DOA: 03/26/2016  Referring MD/NP/PA: Dr. Eulis Foster PCP: Dorothyann Peng, NP  Outpatient Specialists:-- Patient coming from: Home Chief Complaint: Nausea and vomiting  HPI: Madison [name] is a 55 y.o. 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 Patient notes the symptoms have been ongoing over the last 4-5 weeks and not improved. She is unable to keep any food or liquids down regularly. When asked about her diabetic regimen patient states that she takes her insulin as advised and that her sugars have been running in 90-180. Due to the repeated nausea and vomiting symptoms she reports lower abdominal pain and back pain. Associated symptoms also include urinary frequency and some shortness of breath. Denies any dysuria, chest pain, fever, chills, leg swelling, diarrhea, constipation. Patient was just evaluated 3 days ago for similar complaints and hospitalized one week prior to that for diabetic ketoacidosis.  ED Course: Upon admission into the emergency department patient was found to be afebrile, heart rates up to 119, respirations up to 25, blood pressure as low as 90/51, and O2 saturations maintained. Initial lab work revealed a WBC 9.5, hemoglobin 12, platelets 439, sodium 127, potassium 4.7, chloride 90, CO2 9, BUN 17, creatinine 1.5, blood glucose of 673, glucose 673, anion gap 28. UA positive for acute sounds otherwise negative for any signs of bacteria. While in the emergency department she started on the glucose stabilized per protocol. Chest x-ray was clear of any signs of infection.  Review of Systems: As per HPI otherwise 10 point review of systems negative.    Past Medical History  Diagnosis Date  . CAD (coronary artery disease)   .  Obesity   . Hypercholesteremia   . HTN (hypertension)   . Dyslipidemia   . Aortic stenosis   . Thrombophlebitis   . Heart murmur   . Myocardial infarction ([place]) 12/2014  . Pneumonia 11/2014; 12/2014  . GERD (gastroesophageal reflux disease)   . Stroke syndrome [facility]) 1995; 2001    "when my son was born; problems w/speech and L hand since then" (01/19/2015)  . Anxiety   . Depression   . Allergy   . CHF (congestive heart failure) ([place])   . Rheumatoid arthritis(714.0)     "hands" (07/01/2015)  . Diabetes ([place]) dx'd 1982    "went straight to insulin"    Past Surgical History  Procedure Laterality Date  . Cesarean section  1995  . Foot fracture surgery    . Knee arthroscopy Right   . Neuroplasty / transposition median nerve at carpal tunnel    . Left heart catheterization with coronary angiogram N/A 12/08/2014    Procedure: LEFT HEART CATHETERIZATION WITH CORONARY ANGIOGRAM;  Surgeon: Wellington Hampshire, MD;  Location: [facility] CATH LAB;  Service: Cardiovascular;  Laterality: N/A;  . Percutaneous coronary stent intervention (pci-s) N/A 12/09/2014    Procedure: PERCUTANEOUS CORONARY STENT INTERVENTION (PCI-S);  Surgeon: Leonie Man, MD;  Location: [facility] CATH LAB;  Service: Cardiovascular;  Laterality: N/A;  . Cardiac catheterization  12/08/2014  . Coronary angioplasty with stent placement  12/09/2014  . Aortic valve replacement (avr)/coronary artery bypass grafting (cabg)  "2014"    Archie Endo 03/08/2014  . Fracture surgery    . Tubal ligation  1995  . Cataract extraction Left   . Coronary artery bypass graft    .  Orif ankle fracture Left 07/04/2015    Procedure: OPEN REDUCTION INTERNAL FIXATION (ORIF)  TRIMAL ANKLE FRACTURE;  Surgeon: Leandrew Koyanagi, MD;  Location: [facility];  Service: Orthopedics;  Laterality: Left;     reports that she has never smoked. She has never used smokeless tobacco. She reports that she does not drink alcohol or use illicit drugs.  Allergies  Allergen Reactions  . Adhesive  [Tape] Other (See Comments)    Can burn skin if left on too long  . Celebrex [Celecoxib] Rash  . Detrol [Tolterodine] Hives    Family History  Problem Relation Age of Onset  . Stroke    . Heart disease Mother   . Heart disease Sister     Prior to Admission medications   Medication Sig Start Date End Date Taking? Authorizing Provider  Blood Glucose Monitoring Suppl ([place]) w/Device KIT Use to check glucose 2x per day 03/16/16  Yes Dorothyann Peng, NP  citalopram (CELEXA) 40 MG tablet Take 1 tablet (40 mg total) by mouth daily. 03/08/16  Yes Dorothyann Peng, NP  glucose blood test strip Use as instructed Patient taking differently: 1 each by Other route as needed (as instructed). Use as instructed 03/16/16  Yes Dorothyann Peng, NP  insulin aspart (NOVOLOG) 100 UNIT/ML injection Inject 5 Units into the skin 3 (three) times daily with meals. 02/14/16  Yes Domenic Polite, MD  insulin detemir (LEVEMIR) 100 UNIT/ML injection Inject 0.25 mLs (25 Units total) into the skin daily. 03/13/16  Yes Dorothyann Peng, NP  lisinopril (PRINIVIL,ZESTRIL) 2.5 MG tablet Take 2.5 mg by mouth daily. 01/18/16  Yes Historical Provider, MD  ondansetron (ZOFRAN ODT) 8 MG disintegrating tablet 69m ODT q4 hours prn nausea 03/24/16  Yes Hannah Muthersbaugh, PA-C  ondansetron (ZOFRAN) 4 MG tablet Take 1 tablet (4 mg total) by mouth every 8 (eight) hours as needed for nausea or vomiting. 02/06/16  Yes CDorothyann Peng NP  polyethylene glycol (MIRALAX / GLYCOLAX) packet Take 17 g by mouth 2 (two) times daily. 03/11/16  Yes DEugenie Filler MD  pravastatin (PRAVACHOL) 20 MG tablet TAKE 1 TABLET BY MOUTH EVERY DAY 03/08/16  Yes CDorothyann Peng NP  traZODone (DESYREL) 50 MG tablet Take 50 mg by mouth at bedtime.  12/17/15  Yes Historical Provider, MD  warfarin (COUMADIN) 5 MG tablet Take 1 tablet (5 mg total) by mouth daily. If okay with coumadin clinic. Patient taking differently: Take 7.5 mg by mouth daily at 6 PM. If okay with  coumadin clinic. 03/13/16  Yes DEugenie Filler MD  omeprazole (PRILOSEC) 40 MG capsule TAKE ONE CAPSULE BY MOUTH EVERY DAY Patient not taking: Reported on 03/26/2016 03/20/16   CDorothyann Peng NP    Physical Exam: Filed Vitals:   03/26/16 1600 03/26/16 1630 03/26/16 1700 03/26/16 1820  BP: 120/75 142/63 140/53 168/72  Pulse:  107 111 115  Temp:      TempSrc:      Resp: '22 24 25 23  ' Height:      Weight:      SpO2:  100% 98% 97%      Constitutional: NAD, calm, comfortable Filed Vitals:   03/26/16 1600 03/26/16 1630 03/26/16 1700 03/26/16 1820  BP: 120/75 142/63 140/53 168/72  Pulse:  107 111 115  Temp:      TempSrc:      Resp: '22 24 25 23  ' Height:      Weight:      SpO2:  100% 98% 97%  Eyes: PERRL, lids and conjunctivae normal ENMT: Mucous membranes are Dry. Patient has some dried blood possibly from bitten lip. Posterior pharynx clear of any exudate or lesions.Normal dentition.  Neck: normal, supple, no masses, no thyromegaly Respiratory: clear to auscultation bilaterally, no wheezing, no crackles. Mildly tachypneic. No accessory muscle use.  Cardiovascular: Tachycardic. No lower extremity edema noted. Abdomen: no tenderness, no masses palpated. No hepatosplenomegaly. Bowel sounds positive.  Musculoskeletal: no clubbing / cyanosis. No joint deformity upper and lower extremities. Good ROM, no contractures. Normal muscle tone.  Skin: Some bruising noted on the left hip. Otherwise no other rashes marks noted Neurologic: CN 2-12 grossly intact. Sensation intact, DTR normal. Strength 4/5 on the left upper and lower extremities and 5/5 on the right upper and lower extremities. Slurred speech per patient this is her baseline. Psychiatric: Normal judgment and insight. Alert and oriented x 3. Normal mood.    Labs on Admission: I have personally reviewed following labs and imaging studies  CBC:  Recent Labs Lab 03/20/16 0521 03/24/16 0139 03/26/16 1334  WBC 5.5 16.0* 9.5  HGB  8.6* 10.5* 12.0  HCT 26.4* 32.1* 38.5  MCV 83.5 83.6 88.7  PLT 182 358 956*   Basic Metabolic Panel:  Recent Labs Lab 03/20/16 0521 03/24/16 0139 03/26/16 1334  NA 140 132* 127*  K 4.6 4.3 4.7  CL 110 98* 90*  CO2 25 20* 9*  GLUCOSE 119* 89 673*  BUN 5* 13 17  CREATININE 0.52 0.86 1.50*  CALCIUM 8.2* 9.0 9.1  MG 2.0  --   --   PHOS 3.8  --   --    GFR: Estimated Creatinine Clearance: 38 mL/min (by C-G formula based on Cr of 1.5). Liver Function Tests:  Recent Labs Lab 03/24/16 0139 03/26/16 1334  AST 17 27  ALT 16 22  ALKPHOS 136* 172*  BILITOT 1.3* 1.9*  PROT 6.8 7.8  ALBUMIN 3.7 4.1    Recent Labs Lab 03/24/16 0139  LIPASE 48   No results for input(s): AMMONIA in the last 168 hours. Coagulation Profile:  Recent Labs Lab 03/20/16 0521 03/24/16 0139 03/26/16 1334  INR 2.93* 5.18* 5.85*   Cardiac Enzymes: No results for input(s): CKTOTAL, CKMB, CKMBINDEX, TROPONINI in the last 168 hours. BNP (last 3 results) No results for input(s): PROBNP in the last 8760 hours. HbA1C: No results for input(s): HGBA1C in the last 72 hours. CBG:  Recent Labs Lab 03/20/16 0812 03/20/16 1232 03/26/16 1237 03/26/16 1644 03/26/16 1843  GLUCAP 246* 174* >600* 461* 523*   Lipid Profile: No results for input(s): CHOL, HDL, LDLCALC, TRIG, CHOLHDL, LDLDIRECT in the last 72 hours. Thyroid Function Tests: No results for input(s): TSH, T4TOTAL, FREET4, T3FREE, THYROIDAB in the last 72 hours. Anemia Panel: No results for input(s): VITAMINB12, FOLATE, FERRITIN, TIBC, IRON, RETICCTPCT in the last 72 hours. Urine analysis:    Component Value Date/Time   COLORURINE YELLOW 03/26/2016 [place] 03/26/2016 1253   LABSPEC 1.026 03/26/2016 1253   PHURINE 5.0 03/26/2016 1253   GLUCOSEU >1000* 03/26/2016 1253   HGBUR NEGATIVE 03/26/2016 1253   [place] 03/26/2016 1253   BILIRUBINUR n 06/16/2015 1244   KETONESUR >80* 03/26/2016 1253   PROTEINUR  NEGATIVE 03/26/2016 1253   PROTEINUR n 06/16/2015 1244   UROBILINOGEN 0.2 07/30/2015 1415   UROBILINOGEN 0.2 06/16/2015 1244   NITRITE NEGATIVE 03/26/2016 1253   NITRITE n 06/16/2015 1244   LEUKOCYTESUR NEGATIVE 03/26/2016 1253   Sepsis Labs:  Recent Results (from  the past 240 hour(s))  Blood culture (routine x 2)     Status: None   Collection Time: 03/17/16  2:50 PM  Result Value Ref Range Status   Specimen Description BLOOD LEFT ANTECUBITAL  Final   Special Requests BOTTLES DRAWN AEROBIC AND ANAEROBIC 5CC EA  Final   Culture   Final    NO GROWTH 5 DAYS Performed at [facility]    Report Status 03/22/2016 FINAL  Final  Blood culture (routine x 2)     Status: None   Collection Time: 03/17/16  3:10 PM  Result Value Ref Range Status   Specimen Description BLOOD RIGHT ARM  Final   Special Requests BOTTLES DRAWN AEROBIC AND ANAEROBIC 5CC EA  Final   Culture   Final    NO GROWTH 5 DAYS Performed at [facility]    Report Status 03/22/2016 FINAL  Final  MRSA PCR Screening     Status: None   Collection Time: 03/17/16  6:46 PM  Result Value Ref Range Status   MRSA by PCR NEGATIVE NEGATIVE Final    Comment:        The GeneXpert MRSA Assay (FDA approved for NASAL specimens only), is one component of a comprehensive MRSA colonization surveillance program. It is not intended to diagnose MRSA infection nor to guide or monitor treatment for MRSA infections.   Urine culture     Status: None   Collection Time: 03/18/16  6:27 AM  Result Value Ref Range Status   Specimen Description URINE, CATHETERIZED  Final   Special Requests NONE  Final   Culture   Final    NO GROWTH 2 DAYS Performed at [facility]    Report Status 03/20/2016 FINAL  Final     Radiological Exams on Admission: Dg Chest 2 View  03/26/2016  CLINICAL DATA:  Hyperglycemia. EXAM: CHEST  2 VIEW COMPARISON:  March 17, 2016. FINDINGS: The heart size and mediastinal contours are within normal  limits. Both lungs are clear. Status post cardiac valve repair. No pneumothorax or pleural effusion is noted. The visualized skeletal structures are unremarkable. IMPRESSION: No active cardiopulmonary disease. Electronically Signed   By: Marijo Conception, M.D.   On: 03/26/2016 14:33      Assessment/Plan Diabetic ketoacidoses in a type I diabetic: Acute on chronic. Patient reports nausea and vomiting symptoms. On admission found to have a blood glucose of 673 with anion gap 28, and urinalysis positive for ketones. - admit to stepdown - Glucose stabilizer per protocol - Check BMPs every 4 hours - Will transition to subcutaneous insulin once anion gap closes  - Environmental education officer for further assistance with repeated admissions  Nausea and vomiting - Zofran prn - Checking UDS  Sinus tachycardia suspected secondary to dehydration with patient's history of nausea vomiting symptoms. - Check 12-lead EKG - check troponin  Acute kidney injury : Baseline creatinine noted to be 0.86, but acutely elevated at 1.5 on admission. Suspect prerenal in nature given the patient being also in DKA. - IVF as seen above - check FeNa - Follow-up repeat BMP - Avoid nephrotoxic agents  Essential hypertension - held lisinopril secondary to acute kidney injury   Supratherapeutic INR with history of aortic valve replacement: INR elevated to 5.85 on admission - Hold Coumadin - Pharmacy to dose coumadin  Thrombocytosis: Acute. Likely reactive - Continue to monitor  Hyponatremia: Acute. Secondary to patient's hyperglycemia. - Continue to monitor  Hyperlipidemia - Continue pravastatin  Combined Systolic and  diastolic congestive heart failure: Last EF 45-50% with grade 2 diastolic dysfunction in 12/4460. - Strict I&O's - continuous pulse oximetry  Anxiety/depression - Continue citalopram  GERD - Protonix IV for now change to po later   Insomnia - continue Trazodone    DVT prophylaxis: On  coumadin Code Status: Full Family Communication: None Disposition Plan: Likely home in 2-3 days  Consults called: None Admission status:  Observation SDU   Norval Morton MD [facility] Pager [phone]  If 7PM-7AM, please contact night-coverage [email or web address] Password [email or web address]  03/26/2016, 7:45 PM

## 2016-03-26 NOTE — Telephone Encounter (Addendum)
Crystal with [facility] called to advise pt has spoken to her a,d has continued to vomit over the weekend. Cannot keep anything down. Crystal concerned pt may get dehydrated, and if continuing to take insulin, may bottem out. Crystal advised to call our office, however pt refused.   After reviewing pt's chart, it appears pt did go to the ED, and I advised crystal/

## 2016-03-26 NOTE — Patient Outreach (Signed)
[facility]) Care Management  03/26/2016  DRESDEN AMENT [birth year] [id number]  CSW received a new referral on patient from patient's RNCM with [facility], Emilia Beck, indicating that patient would benefit from social work services and resources to assist with referrals for psychosocial support, as well as counseling and supportive services. CSW made an initial attempt to try and contact patient today to perform phone assessment, as well as assess and assist with social needs and services, without success.  A HIPAA complaint message was left for patient on voicemail.  CSW is currently awaiting a return call. Danford Bad, BSW, MSW, LCSW  Licensed Restaurant manager, [employer]  Mailing [place] [address], [place], [place] [address] Physical Address-[address], [place], [place] [address] Toll Free Main # [phone] Fax # [phone] Cell # [phone]  Fax # [phone]  [name]

## 2016-03-26 NOTE — ED Provider Notes (Signed)
CSN: [id number]     Arrival date & time 03/26/16  1226 History   First MD Initiated Contact with Patient 03/26/16 1305     Chief Complaint  Patient presents with  . Hyperglycemia  . Nausea  . Emesis     (Consider location/radiation/quality/duration/timing/severity/associated sxs/prior Treatment) The history is provided by the patient.     Madison [name] is a 55 y.o. female who presents for evaluation of nausea, vomiting, abdominal pain and hyperglycemia. She presents by EMS. She was in the ED for evaluation of similar problems 3 days ago, and was to the hospital about 1 week ago with DKA. She states that she is taking her regular medicines, including insulin. She has been able to eat and drink some the last few days, but none today. She is alert and answering questions but difficult to understand secondary to poor speech pattern.  Level V caveat- level of acuity     Past Medical History  Diagnosis Date  . CAD (coronary artery disease)   . Obesity   . Hypercholesteremia   . HTN (hypertension)   . Dyslipidemia   . Aortic stenosis   . Thrombophlebitis   . Heart murmur   . Myocardial infarction ([place]) 12/2014  . Pneumonia 11/2014; 12/2014  . GERD (gastroesophageal reflux disease)   . Stroke syndrome [facility]) 1995; 2001    "when my son was born; problems w/speech and L hand since then" (01/19/2015)  . Anxiety   . Depression   . Allergy   . CHF (congestive heart failure) ([place])   . Rheumatoid arthritis(714.0)     "hands" (07/01/2015)  . Diabetes ([place]) dx'd 1982    "went straight to insulin"   Past Surgical History  Procedure Laterality Date  . Cesarean section  1995  . Foot fracture surgery    . Knee arthroscopy Right   . Neuroplasty / transposition median nerve at carpal tunnel    . Left heart catheterization with coronary angiogram N/A 12/08/2014    Procedure: LEFT HEART CATHETERIZATION WITH CORONARY ANGIOGRAM;  Surgeon: Wellington Hampshire, MD;  Location: [facility] CATH LAB;  Service:  Cardiovascular;  Laterality: N/A;  . Percutaneous coronary stent intervention (pci-s) N/A 12/09/2014    Procedure: PERCUTANEOUS CORONARY STENT INTERVENTION (PCI-S);  Surgeon: Leonie Man, MD;  Location: [facility] CATH LAB;  Service: Cardiovascular;  Laterality: N/A;  . Cardiac catheterization  12/08/2014  . Coronary angioplasty with stent placement  12/09/2014  . Aortic valve replacement (avr)/coronary artery bypass grafting (cabg)  "2014"    Archie Endo 03/08/2014  . Fracture surgery    . Tubal ligation  1995  . Cataract extraction Left   . Coronary artery bypass graft    . Orif ankle fracture Left 07/04/2015    Procedure: OPEN REDUCTION INTERNAL FIXATION (ORIF)  TRIMAL ANKLE FRACTURE;  Surgeon: Leandrew Koyanagi, MD;  Location: [facility];  Service: Orthopedics;  Laterality: Left;   Family History  Problem Relation Age of Onset  . Stroke    . Heart disease Mother   . Heart disease Sister    Social History  Substance Use Topics  . Smoking status: Never Smoker   . Smokeless tobacco: Never Used  . Alcohol Use: No   OB History    No data available     Review of Systems  Unable to perform ROS: Acuity of condition      Allergies  Adhesive; Celebrex; and Detrol  Home Medications   Prior to Admission medications   Medication Sig Start  Date End Date Taking? Authorizing Provider  Blood Glucose Monitoring Suppl ([place]) w/Device KIT Use to check glucose 2x per day 03/16/16  Yes Dorothyann Peng, NP  citalopram (CELEXA) 40 MG tablet Take 1 tablet (40 mg total) by mouth daily. 03/08/16  Yes Dorothyann Peng, NP  glucose blood test strip Use as instructed Patient taking differently: 1 each by Other route as needed (as instructed). Use as instructed 03/16/16  Yes Dorothyann Peng, NP  insulin aspart (NOVOLOG) 100 UNIT/ML injection Inject 5 Units into the skin 3 (three) times daily with meals. 02/14/16  Yes Domenic Polite, MD  insulin detemir (LEVEMIR) 100 UNIT/ML injection Inject 0.25 mLs (25 Units  total) into the skin daily. 03/13/16  Yes Dorothyann Peng, NP  lisinopril (PRINIVIL,ZESTRIL) 2.5 MG tablet Take 2.5 mg by mouth daily. 01/18/16  Yes Historical Provider, MD  ondansetron (ZOFRAN ODT) 8 MG disintegrating tablet 53m ODT q4 hours prn nausea 03/24/16  Yes Hannah Muthersbaugh, PA-C  ondansetron (ZOFRAN) 4 MG tablet Take 1 tablet (4 mg total) by mouth every 8 (eight) hours as needed for nausea or vomiting. 02/06/16  Yes CDorothyann Peng NP  polyethylene glycol (MIRALAX / GLYCOLAX) packet Take 17 g by mouth 2 (two) times daily. 03/11/16  Yes DEugenie Filler MD  pravastatin (PRAVACHOL) 20 MG tablet TAKE 1 TABLET BY MOUTH EVERY DAY 03/08/16  Yes CDorothyann Peng NP  traZODone (DESYREL) 50 MG tablet Take 50 mg by mouth at bedtime.  12/17/15  Yes Historical Provider, MD  warfarin (COUMADIN) 5 MG tablet Take 1 tablet (5 mg total) by mouth daily. If okay with coumadin clinic. Patient taking differently: Take 7.5 mg by mouth daily at 6 PM. If okay with coumadin clinic. 03/13/16  Yes DEugenie Filler MD  omeprazole (PRILOSEC) 40 MG capsule TAKE ONE CAPSULE BY MOUTH EVERY DAY Patient not taking: Reported on 03/26/2016 03/20/16   CDorothyann Peng NP   BP 168/72 mmHg  Pulse 115  Temp(Src) 98.6 F (37 C) (Rectal)  Resp 23  Ht '5\' 3"'  (1.6 m)  Wt 140 lb (63.504 kg)  BMI 24.81 kg/m2  SpO2 97% Physical Exam  Constitutional:  Frail, appears older than stated age  HENT:  Head: Normocephalic and atraumatic.  Right Ear: External ear normal.  Left Ear: External ear normal.  Mucous membranes are dry  Eyes: Conjunctivae and EOM are normal. Pupils are equal, round, and reactive to light.  Neck: Normal range of motion and phonation normal. Neck supple.  Cardiovascular: Regular rhythm.   Tachycardic, hypotensive  Pulmonary/Chest: Effort normal and breath sounds normal. No respiratory distress. She exhibits no bony tenderness.  Abdominal: Soft. There is tenderness (Diffuse, mild).  Musculoskeletal: Normal range of  motion.  Neurological: She is alert. No cranial nerve deficit or sensory deficit. She exhibits normal muscle tone. Coordination normal.  Alert and responsive. Her mouth is extremely dry, making her speech difficult to understand. There is no clear evidence for dysarthria.  Skin: Skin is warm, dry and intact.  Psychiatric: She has a normal mood and affect. Her behavior is normal.  Nursing note and vitals reviewed.   ED Course  Procedures (including critical care time)  Initial clinical impression- extreme volume depletion, likely related to hyperglycemia. Hyperglycemia state, is recurrent, with recent episode of DKA. No overt sign for infectious etiology. Will treat with fluids, get labs, x-ray chest, and reassess.   Medications  dextrose 5 %-0.45 % sodium chloride infusion ( Intravenous Hold 03/26/16 1651)  insulin regular (NOVOLIN R,HUMULIN  R) 250 Units in sodium chloride 0.9 % 250 mL (1 Units/mL) infusion (9.3 Units/hr Intravenous Rate/Dose Change 03/26/16 1846)  sodium chloride 0.9 % bolus 2,052 mL (0 mL/kg  68.4 kg Intravenous Stopped 03/26/16 1525)  ondansetron (ZOFRAN) injection 4 mg (4 mg Intravenous Given 03/26/16 1410)  ondansetron (ZOFRAN) injection 4 mg (4 mg Intravenous Given 03/26/16 1648)    Patient Vitals for the past 24 hrs:  BP Temp Temp src Pulse Resp SpO2 Height Weight  03/26/16 1820 168/72 mmHg - - 115 23 97 % - -  03/26/16 1700 (!) 140/53 mmHg - - 111 25 98 % - -  03/26/16 1630 142/63 mmHg - - 107 24 100 % - -  03/26/16 1600 120/75 mmHg - - - 22 - - -  03/26/16 1540 - 98.6 F (37 C) Rectal - - - - -  03/26/16 1530 130/57 mmHg - - 96 23 100 % - -  03/26/16 1309 (!) 90/51 mmHg 97.8 F (36.6 C) Oral 119 22 100 % '5\' 3"'  (1.6 m) 140 lb (63.504 kg)    6:58 PM Reevaluation with update and discussion. After initial assessment and treatment, an updated evaluation reveals Repeat vital signs, with good improvement of blood pressure after IV resuscitation. Patient is alert, with  improved clinical appearance relative to dehydration. She is tolerating some oral liquids. In states that she feels better. She agrees to admission.Daleen Bo L   7:04 PM-Consult complete with Hospitalist. Patient case explained and discussed. He agrees to admit patient for further evaluation and treatment. Call ended at [place] Performed by: Richarda Blade Total critical care time: 45 minutes Critical care time was exclusive of separately billable procedures and treating other patients. Critical care was necessary to treat or prevent imminent or life-threatening deterioration. Critical care was time spent personally by me on the following activities: development of treatment plan with patient and/or surrogate as well as nursing, discussions with consultants, evaluation of patient's response to treatment, examination of patient, obtaining history from patient or surrogate, ordering and performing treatments and interventions, ordering and review of laboratory studies, ordering and review of radiographic studies, pulse oximetry and re-evaluation of patient's condition.   Labs Review Labs Reviewed  CBC - Abnormal; Notable for the following:    RDW 20.0 (*)    Platelets 439 (*)    All other components within normal limits  URINALYSIS, ROUTINE W REFLEX MICROSCOPIC (NOT AT [facility]) - Abnormal; Notable for the following:    Glucose, UA >1000 (*)    Ketones, ur >80 (*)    All other components within normal limits  COMPREHENSIVE METABOLIC PANEL - Abnormal; Notable for the following:    Sodium 127 (*)    Chloride 90 (*)    CO2 9 (*)    Glucose, Bld 673 (*)    Creatinine, Ser 1.50 (*)    Alkaline Phosphatase 172 (*)    Total Bilirubin 1.9 (*)    GFR calc non Af Amer 38 (*)    GFR calc Af Amer 44 (*)    Anion gap 28 (*)    All other components within normal limits  BLOOD GAS, VENOUS - Abnormal; Notable for the following:    pH, Ven 7.218 (*)    pCO2, Ven 27.4 (*)     Bicarbonate 10.7 (*)    Acid-base deficit 16.1 (*)    All other components within normal limits  PROTIME-INR - Abnormal; Notable for the following:    Prothrombin Time 49.1 (*)  INR 5.85 (*)    All other components within normal limits  URINE MICROSCOPIC-ADD ON - Abnormal; Notable for the following:    Squamous Epithelial / LPF 0-5 (*)    All other components within normal limits  CBG MONITORING, ED - Abnormal; Notable for the following:    Glucose-Capillary >600 (*)    All other components within normal limits  CBG MONITORING, ED - Abnormal; Notable for the following:    Glucose-Capillary 461 (*)    All other components within normal limits  CBG MONITORING, ED - Abnormal; Notable for the following:    Glucose-Capillary 523 (*)    All other components within normal limits    BUN  Date Value Ref Range Status  03/26/2016 17 6 - 20 mg/dL Final  03/24/2016 13 6 - 20 mg/dL Final  03/20/2016 5* 6 - 20 mg/dL Final  03/19/2016 5* 6 - 20 mg/dL Final   CREATININE, SER  Date Value Ref Range Status  03/26/2016 1.50* 0.44 - 1.00 mg/dL Final  03/24/2016 0.86 0.44 - 1.00 mg/dL Final  03/20/2016 0.52 0.44 - 1.00 mg/dL Final  03/19/2016 0.55 0.44 - 1.00 mg/dL Final    INR  Date Value Ref Range Status  03/26/2016 5.85* 0.00 - 1.49 Final    Comment:    CRITICAL RESULT CALLED TO, READ BACK BY AND VERIFIED WITH: Pilar Jarvis [id number] @ 1438 BY J SCOTTON   03/24/2016 5.18* 0.00 - 1.49 Final    Comment:    CRITICAL RESULT CALLED TO, READ BACK BY AND VERIFIED WITH: WARD,A RN [phone] [id number] COVINGTON,N   03/20/2016 2.93* 0.00 - 1.49 Final  03/19/2016 5.24* 0.00 - 1.49 Final    Comment:    CRITICAL RESULT CALLED TO, READ BACK BY AND VERIFIED WITH: AMBURN RN AT 0615 ON 05.01.17 BY SHUEA   03/13/2016 1.4  Final  02/06/2016 3.8  Final  01/26/2016 1.5  Final  01/19/2016 1.5  Final      Imaging Review Dg Chest 2 View  03/26/2016  CLINICAL DATA:  Hyperglycemia. EXAM: CHEST  2 VIEW  COMPARISON:  March 17, 2016. FINDINGS: The heart size and mediastinal contours are within normal limits. Both lungs are clear. Status post cardiac valve repair. No pneumothorax or pleural effusion is noted. The visualized skeletal structures are unremarkable. IMPRESSION: No active cardiopulmonary disease. Electronically Signed   By: Marijo Conception, M.D.   On: 03/26/2016 14:33   I have personally reviewed and evaluated these images and lab results as part of my medical decision-making.   EKG Interpretation None      MDM   Final diagnoses:  Diabetic ketoacidosis without coma associated with type 1 diabetes mellitus ([place])  Dehydration    Recurrent DKA with vomiting and abdominal pain. Patient appears significantly dehydrated. This is a recurrent issue. There are no clear etiologies for this recurrence today. INR is inappropriately prolonged. She will require admission for further evaluation and treatment.    Nursing Notes Reviewed/ Care Coordinated, and agree without changes. Applicable Imaging Reviewed.  Interpretation of Laboratory Data incorporated into ED treatment   Plan: Admit   Daleen Bo, MD 03/27/16 1051

## 2016-03-26 NOTE — ED Notes (Signed)
Pt given water for fluid challenge 

## 2016-03-26 NOTE — ED Notes (Signed)
PT not in rm. Nurse will start line and collect labs

## 2016-03-26 NOTE — Patient Outreach (Signed)
Chart review for review of labs, assessment during today's emergency room visit.  Plan: Will follow up with patient after discharge later this week.

## 2016-03-26 NOTE — ED Notes (Signed)
hospitalist at bedside for assessment

## 2016-03-26 NOTE — Progress Notes (Signed)
[facility] sent message to admitting MD regarding admission status.

## 2016-03-26 NOTE — Telephone Encounter (Signed)
Pt seen in ED 

## 2016-03-27 ENCOUNTER — Ambulatory Visit: Payer: Self-pay

## 2016-03-27 ENCOUNTER — Encounter (HOSPITAL_COMMUNITY): Payer: Self-pay

## 2016-03-27 ENCOUNTER — Other Ambulatory Visit: Payer: Self-pay | Admitting: *Deleted

## 2016-03-27 DIAGNOSIS — N179 Acute kidney failure, unspecified: Secondary | ICD-10-CM | POA: Diagnosis present

## 2016-03-27 DIAGNOSIS — E785 Hyperlipidemia, unspecified: Secondary | ICD-10-CM | POA: Insufficient documentation

## 2016-03-27 DIAGNOSIS — I5042 Chronic combined systolic (congestive) and diastolic (congestive) heart failure: Secondary | ICD-10-CM | POA: Insufficient documentation

## 2016-03-27 LAB — BASIC METABOLIC PANEL
ANION GAP: 17 — AB (ref 5–15)
Anion gap: 18 — ABNORMAL HIGH (ref 5–15)
Anion gap: 12 (ref 5–15)
Anion gap: 10 (ref 5–15)
BUN: 11 mg/dL (ref 6–20)
BUN: 9 mg/dL (ref 6–20)
BUN: 13 mg/dL (ref 6–20)
BUN: 13 mg/dL (ref 6–20)
CALCIUM: 8.4 mg/dL — AB (ref 8.9–10.3)
CALCIUM: 8.8 mg/dL — AB (ref 8.9–10.3)
CHLORIDE: 108 mmol/L (ref 101–111)
CHLORIDE: 101 mmol/L (ref 101–111)
CO2: 21 mmol/L — AB (ref 22–32)
CO2: 13 mmol/L — AB (ref 22–32)
CO2: 15 mmol/L — AB (ref 22–32)
CO2: 15 mmol/L — AB (ref 22–32)
CREATININE: 1.01 mg/dL — AB (ref 0.44–1.00)
CREATININE: 0.9 mg/dL (ref 0.44–1.00)
CREATININE: 0.9 mg/dL (ref 0.44–1.00)
Calcium: 8.9 mg/dL (ref 8.9–10.3)
Calcium: 8.6 mg/dL — ABNORMAL LOW (ref 8.9–10.3)
Chloride: 106 mmol/L (ref 101–111)
Chloride: 107 mmol/L (ref 101–111)
Creatinine, Ser: 1.02 mg/dL — ABNORMAL HIGH (ref 0.44–1.00)
GFR calc Af Amer: >60 mL/min (ref >60)
GFR calc Af Amer: >60 mL/min (ref >60)
GFR calc non Af Amer: >60 mL/min (ref >60)
GFR calc non Af Amer: >60 mL/min (ref >60)
GLUCOSE: 165 mg/dL — AB (ref 65–99)
GLUCOSE: 137 mg/dL — AB (ref 65–99)
GLUCOSE: 190 mg/dL — AB (ref 65–99)
Glucose, Bld: 119 mg/dL — ABNORMAL HIGH (ref 65–99)
POTASSIUM: 4.4 mmol/L (ref 3.5–5.1)
POTASSIUM: 3.9 mmol/L (ref 3.5–5.1)
POTASSIUM: 3.6 mmol/L (ref 3.5–5.1)
Potassium: 4.3 mmol/L (ref 3.5–5.1)
SODIUM: 132 mmol/L — AB (ref 135–145)
Sodium: 135 mmol/L (ref 135–145)
Sodium: 137 mmol/L (ref 135–145)
Sodium: 139 mmol/L (ref 135–145)

## 2016-03-27 LAB — CBC
HEMATOCRIT: 38.3 % (ref 36.0–46.0)
Hemoglobin: 12.7 g/dL (ref 12.0–15.0)
MCH: 27.8 pg (ref 26.0–34.0)
MCHC: 33.2 g/dL (ref 30.0–36.0)
MCV: 83.8 fL (ref 78.0–100.0)
PLATELETS: 241 10*3/uL (ref 150–400)
RBC: 4.57 MIL/uL (ref 3.87–5.11)
RDW: 20.4 % — ABNORMAL HIGH (ref 11.5–15.5)
WBC: 11.7 10*3/uL — ABNORMAL HIGH (ref 4.0–10.5)

## 2016-03-27 LAB — GLUCOSE, CAPILLARY
GLUCOSE-CAPILLARY: 113 mg/dL — AB (ref 65–99)
GLUCOSE-CAPILLARY: 130 mg/dL — AB (ref 65–99)
GLUCOSE-CAPILLARY: 134 mg/dL — AB (ref 65–99)
GLUCOSE-CAPILLARY: 144 mg/dL — AB (ref 65–99)
GLUCOSE-CAPILLARY: 148 mg/dL — AB (ref 65–99)
GLUCOSE-CAPILLARY: 156 mg/dL — AB (ref 65–99)
GLUCOSE-CAPILLARY: 161 mg/dL — AB (ref 65–99)
GLUCOSE-CAPILLARY: 164 mg/dL — AB (ref 65–99)
GLUCOSE-CAPILLARY: 172 mg/dL — AB (ref 65–99)
GLUCOSE-CAPILLARY: 178 mg/dL — AB (ref 65–99)
GLUCOSE-CAPILLARY: 182 mg/dL — AB (ref 65–99)
GLUCOSE-CAPILLARY: 73 mg/dL (ref 65–99)
Glucose-Capillary: 105 mg/dL — ABNORMAL HIGH (ref 65–99)
Glucose-Capillary: 213 mg/dL — ABNORMAL HIGH (ref 65–99)
Glucose-Capillary: 156 mg/dL — ABNORMAL HIGH (ref 65–99)
Glucose-Capillary: 144 mg/dL — ABNORMAL HIGH (ref 65–99)
Glucose-Capillary: 167 mg/dL — ABNORMAL HIGH (ref 65–99)
Glucose-Capillary: 175 mg/dL — ABNORMAL HIGH (ref 65–99)
Glucose-Capillary: 150 mg/dL — ABNORMAL HIGH (ref 65–99)
Glucose-Capillary: 130 mg/dL — ABNORMAL HIGH (ref 65–99)

## 2016-03-27 LAB — MRSA PCR SCREENING: MRSA by PCR: NEGATIVE

## 2016-03-27 LAB — TROPONIN I

## 2016-03-27 LAB — PROTIME-INR
INR: 8.16 (ref 0.00–1.49)
Prothrombin Time: 63 seconds — ABNORMAL HIGH (ref 11.6–15.2)

## 2016-03-27 LAB — CREATININE, URINE, RANDOM: Creatinine, Urine: 17.61 mg/dL

## 2016-03-27 LAB — SODIUM, URINE, RANDOM: Sodium, Ur: 65 mmol/L

## 2016-03-27 LAB — BRAIN NATRIURETIC PEPTIDE: B NATRIURETIC PEPTIDE 5: 73.7 pg/mL (ref 0.0–100.0)

## 2016-03-27 MED ORDER — CYCLOBENZAPRINE HCL 5 MG PO TABS
5.0000 mg | ORAL_TABLET | Freq: Once | ORAL | Status: AC
  Administered 2016-03-28: 5 mg via ORAL
  Filled 2016-03-27: qty 1

## 2016-03-27 MED ORDER — KETOROLAC TROMETHAMINE 30 MG/ML IJ SOLN
30.0000 mg | Freq: Once | INTRAMUSCULAR | Status: AC
  Administered 2016-03-28: 30 mg via INTRAVENOUS
  Filled 2016-03-27: qty 1

## 2016-03-27 MED ORDER — INSULIN ASPART 100 UNIT/ML ~~LOC~~ SOLN
0.0000 [IU] | Freq: Three times a day (TID) | SUBCUTANEOUS | Status: DC
  Administered 2016-03-27 – 2016-03-28 (×2): 2 [IU] via SUBCUTANEOUS

## 2016-03-27 MED ORDER — FUROSEMIDE 10 MG/ML IJ SOLN
20.0000 mg | Freq: Two times a day (BID) | INTRAMUSCULAR | Status: DC
Start: 2016-03-27 — End: 2016-03-28
  Administered 2016-03-27 (×2): 20 mg via INTRAVENOUS
  Filled 2016-03-27 (×4): qty 2

## 2016-03-27 MED ORDER — INSULIN ASPART 100 UNIT/ML ~~LOC~~ SOLN: 0.0000 [IU] | Freq: Every day | SUBCUTANEOUS | Status: DC

## 2016-03-27 MED ORDER — VITAMIN K1 10 MG/ML IJ SOLN
5.0000 mg | Freq: Once | INTRAMUSCULAR | Status: AC
  Administered 2016-03-27: 5 mg via SUBCUTANEOUS
  Filled 2016-03-27: qty 0.5

## 2016-03-27 MED ORDER — INSULIN DETEMIR 100 UNIT/ML ~~LOC~~ SOLN
25.0000 [IU] | Freq: Every day | SUBCUTANEOUS | Status: DC
  Administered 2016-03-27: 25 [IU] via SUBCUTANEOUS
  Filled 2016-03-27: qty 0.25

## 2016-03-27 MED ORDER — PHENOL 1.4 % MT LIQD
1.0000 | OROMUCOSAL | Status: DC | PRN
  Administered 2016-03-27 – 2016-03-28 (×2): 1 via OROMUCOSAL
  Filled 2016-03-27: qty 177

## 2016-03-27 NOTE — Patient Outreach (Signed)
[facility]) Care Management  03/27/2016  Madison [name] 03-30-1961 [id number]  Patient was discussed at length this morning during the Difficult Case Discussion with [facility] Team Members present.  Also present during the discussion were, Luci Bank, Department Director and Assistant Department Director, Livia Snellen, both with [facility] Care Management.  Valuable other team members were present and offered input into patient's specific care plan.  CSW was encouraged to outreach to patient's inpatient hospital social worker at [facility], to ensure that they are aware of patient's extensive mental health background and current psychosocial situation.   CSW sent an In Basket message to [facility], [facility] Liaison with [facility] Care Management, to request inpatient social work involvement while patient is hospitalized.  CSW explained to Ms. Margo Aye that a referral needs to be made for patient to receive a psychiatric consult.  Patient's level of competency came into question during the discussion, as well as patient's ability to care for herself independently and appropriately at home.  Patient may be struggling with undiagnosed developmental disabilities, in addition to cognitive deficits associated with patient's Diabetes.  CSW will await a response. Danford Bad, BSW, MSW, LCSW  Licensed Restaurant manager, fast food [facility]  Mailing [place] [address], [place], [place] [address] Physical Address-[address], [place], [place] [address] Toll Free Main # [phone] Fax # [phone] Cell # [phone]  Fax # [phone]  [name] [employer]  Discrimination is Against the [employer]. and its subsidiaries comply with applicable Federal civil rights laws and do not discriminate on the basis of race, color, national origin, age, disability, or sex.  [employer]. and its subsidiaries do not exclude people or treat them differently because of race, color, national origin, age, disability, or sex.    [employer]. and its subsidiaries provide:  . Free auxiliary aids and services, such as qualified sign language interpreters, video remote interpretation, and written information in other formats to people with disabilities when such auxiliary aids and services are necessary to ensure an equal opportunity to participate. . Free language services to people whose primary language is not English when those services are necessary to provide meaningful access, such as translated documents or oral interpretation.    If you need these services, call [phone] or if you use a TTY, call 711.   If you believe that [employer]. and its subsidiaries have failed to provide these services or discriminated in another way on the basis of race, color, national origin, age, disability, or sex, you can file a Theatre manager with:   Discrimination Grievances  [address] [address]  [place], [place] [address]   If you need help filing a grievance, call [phone] or if you use a TTY, call 711.  You can also file a civil rights complaint with the U.S. Department of Health and Health and safety inspector, Office for Civil Rights electronically through the Office for Civil Rights Complaint Portal, available at [email or web address], or by mail or phone at:   U.S. Department of Health and Human Services  [address], [place]  Room [phone], [facility] Building  [place], [place]. [address]  [phone], [phone] (TDD)  Complaint forms are available at [email or web address]             [facility] Multi-Language Interpreter Services  English: ATTENTION: If you do not speak English, language assistance services, free of charge, are available to you. Call [phone]  (TTY: 711).  Espaol (Spanish):  ATENCIN: si habla espaol, tiene a  su disposicin servicios gratuitos de asistencia lingstica. Llame al [phone] (TTY: 711).  ???? (Chinese): ?????????????????????????????? [phone] (TTY:711??  Ti?ng Vi?t (Vietnamese): CH : N?u b?n ni Ti?ng Vi?t, c cc d?ch v? h? tr? ngn ng? mi?n ph dnh cho b?n. G?i s? [phone] (TTY: 711).  ??? ([place]): ?? : ???? ????? ?? , ?? ?? ???? ??? ???? ? ???? . [phone] (TTY: 711)??? ??? ???? .  Tagalog (Tagalog - Filipino): PAUNAWA: Kung nagsasalita ka ng Tagalog, maaari kang gumamit ng mga serbisyo ng tulong sa wika nang walang bayad. [place] sa [phone] (TTY: 711).   [place]): :      ,      .  [phone] (: 711).  Kreyl Ayisyen (Jersey): ATANSYON: Si w pale Abe People, gen svis d pou lang ki disponib gratis pou ou. Rele [phone] (TTY: 711).  [name]: ATTENTION : Si vous parlez franais, des services d'aide linguistique vous sont proposs gratuitement. Appelez le [phone] (ATS : 711).  Polski (Polish): UWAGA: Jeeli mwisz po polsku, moesz skorzysta z bezpatnej pomocy jzykowej. Zadzwo pod numer [phone] (TTY: 711).  Portugus ([place]): ATENO: Se fala portugus, encontram-se disponveis servios lingusticos, grtis. Ligue para [phone] (TTY: 711).   Italiano ([place]): ATTENZIONE: In caso la lingua parlata sia l'italiano, sono disponibili servizi di assistenza linguistica gratuiti. Chiamare il numero [phone] (TTY: 711).  Tobie Lords ([place]rones[place]): ACHTUNG: Wenn Sie Deutsch sprechen, stehen Ihnen kostenlos sprachliche Hilfsdienstleistungen zur [employer]. Rufnummer: [phone] (TTY: 711).   (Arabic): [phone]   .            : .)711 :   (  ??? (Japanese):  ??????????????????????????????????[phone] ?TTY?711?????????????????  ? (Farsi): [phone]  . ?   ? ?  ? ? ?~ ?  ?    : .??  (TTY: 711)  Din Bizaad ([place]ajo): D77 baa ak0 n7n7zin: D77 saad bee y1n7[ti'go [name], saad bee 1k1'1n7da'1wo'd66', t'11 [name] n1 h0l=, koj8' h0d77lnih [phone] (TTY: 711).

## 2016-03-27 NOTE — Progress Notes (Signed)
MD notified after pts large bout of emesis. MD requested sample be tested for blood. Patient has not vomited since. Will continue to monitor.

## 2016-03-27 NOTE — Progress Notes (Signed)
Inpatient Diabetes Program Recommendations  AACE/ADA: New Consensus Statement on Inpatient Glycemic Control (2015)  Target Ranges:  Prepandial:   less than 140 mg/dL      Peak postprandial:   less than 180 mg/dL (1-2 hours)      Critically ill patients:  140 - 180 mg/dL   Review of Glycemic Control  Transitioning off insulin drip. Results for JANASIA, COVERDALE (MRN [id number]) as of 03/27/2016 18:26  Ref. Range 03/27/2016 14:58  Sodium Latest Ref Range: 135-145 mmol/L 132 (L)  Potassium Latest Ref Range: 3.5-5.1 mmol/L 3.6  Chloride Latest Ref Range: 101-111 mmol/L 101  CO2 Latest Ref Range: 22-32 mmol/L 21 (L)  BUN Latest Ref Range: 6-20 mg/dL 9  Creatinine Latest Ref Range: 0.44-1.00 mg/dL 0.90  Calcium Latest Ref Range: 8.9-10.3 mg/dL 8.4 (L)  EGFR (Non-African Amer.) Latest Ref Range: >60 mL/min >60  EGFR (African American) Latest Ref Range: >60 mL/min >60  Glucose Latest Ref Range: 65-99 mg/dL 165 (H)  Anion gap Latest Ref Range: 5-15  10  Results for AMARRA, SAWYER (MRN [id number]) as of 03/27/2016 18:26  Ref. Range 01/28/2016 03:39  Hemoglobin A1C Latest Ref Range: 4.8-5.6 % 9.9 (H)   Will need meal coverage insulin.  Inpatient Diabetes Program Recommendations:    Consider addition of Novolog 4 units tidwc for meal coverage insulin if pt eats > 50% meal.  Will f/u with pt in am. Thank you. Lorenda Peck, RD, LDN, CDE Inpatient Diabetes Coordinator [phone]

## 2016-03-27 NOTE — Addendum Note (Signed)
Addended by: Wenda Overland on: 03/27/2016 09:50 AM   Modules accepted: Level of Service, SmartSet

## 2016-03-27 NOTE — Progress Notes (Signed)
This encounter was created in error - please disregard.

## 2016-03-27 NOTE — Progress Notes (Signed)
PROGRESS NOTE  Madison [name] DUK:[id number] DOB: 08-24-1961 DOA: 03/26/2016 PCP: [name], NP  HPI/Recap of past 24 hours: Patient is a 55 year old female with past medical history of chronic systolic/diastolic heart failure, previous CVA, chronic anticoagulation and poorly controlled diabetes mellitus type 1 on insulin who is had numerous admissions over the past few months for DKA. Patient lives at home with roommates. There is some question as to whether or not she is probably taking her medications.  Patient was admitted on 5/9 with complaints of nausea and vomiting and found to be in DKA again. She was placed in stepdown unit and started on IV fluids plus insulin drip and by the following morning, CBGs better although anion gap took a little longer to resolve.  Patient herself states that she is feeling better this morning. She answers questions very straightforward and there is question of cognitive deficit. She denies any complaints.  Assessment/Plan: Principal Problem:   DKA (diabetic ketoacidoses) (HCC): A recurrent problem. Psychiatry consulted for competency. Weaning off of insulin drip. Active Problems:   Hyperlipidemia   Chronic anticoagulation   Supratherapeutic INR: Unclear etiology. Was concerned about the possibility of CHF, however BNP within normal range and she did not have much response to Lasix. It may be that she is having issues with either taking too much or not taking enough medication  Chronic systolic/diastolic heart failure: Currently. Actually euvolemic. Will monitor closely for volume overload    Essential hypertension: Blood pressure stable   Insomnia   AKI (acute kidney injury) (HCC): Secondary to DKA and hypovolemia. Resolved with IV fluids  Code Status: Full code   Family Communication: Left message for sister   Disposition Plan: Transition to floor, anticipate discharge in next few days depending on competency    Consultants:  Psychiatry    Procedures:  None   Antimicrobials:  None   DVT prophylaxis:  Lovenox   Objective: Filed Vitals:   03/27/16 0815 03/27/16 1000 03/27/16 1200 03/27/16 1202  BP:  155/63 148/61   Pulse:  110 109   Temp: 98.7 F (37.1 C)   98.6 F (37 C)  TempSrc: Oral   Oral  Resp:  17 18   Height:      Weight:      SpO2:  98% 98%     Intake/Output Summary (Last 24 hours) at 03/27/16 1242 Last data filed at 03/27/16 1200  Gross per 24 hour  Intake 1565.16 ml  Output   1200 ml  Net 365.16 ml   Filed Weights   03/26/16 1309  Weight: 63.504 kg (140 lb)    Exam:   General:  Alert and oriented 3, no acute distress   Cardiovascular: Regular rate and rhythm, S1-S2, borderline tachycardia   Respiratory: Clear to Auscultation bilaterally   Abdomen: Soft, nontender, nondistended, hypoactive bowel sounds   Musculoskeletal: No clubbing or cyanosis or edema   Skin: No skin breaks, tears or lesions  Psychiatry: Patient seems appropriate, but questionable cognitive deficits    Data Reviewed: CBC:  Recent Labs Lab 03/24/16 0139 03/26/16 1334 03/27/16 0339  WBC 16.0* 9.5 11.7*  HGB 10.5* 12.0 12.7  HCT 32.1* 38.5 38.3  MCV 83.6 88.7 83.8  PLT 358 439* 241   Basic Metabolic Panel:  Recent Labs Lab 03/26/16 1334 03/26/16 1945 03/27/16 0017 03/27/16 0339 03/27/16 0830  NA 127* 135 139 137 135  K 4.7 4.3 4.3 4.4 3.9  CL 90* 104 107 106 108  CO2 9* 8* 15*  13* 15*  GLUCOSE 673* 435* 137* 190* 119*  BUN 17 14 13 13 11   CREATININE 1.50* 1.26* 1.02* 1.01* 0.90  CALCIUM 9.1 8.6* 8.8* 8.9 8.6*   GFR: Estimated Creatinine Clearance: 63.3 mL/min (by C-G formula based on Cr of 0.9). Liver Function Tests:  Recent Labs Lab 03/24/16 0139 03/26/16 1334  AST 17 27  ALT 16 22  ALKPHOS 136* 172*  BILITOT 1.3* 1.9*  PROT 6.8 7.8  ALBUMIN 3.7 4.1    Recent Labs Lab 03/24/16 0139 03/26/16 1948  LIPASE 48 19   No results for input(s): AMMONIA in the last 168  hours. Coagulation Profile:  Recent Labs Lab 03/24/16 0139 03/26/16 1334 03/27/16 0339  INR 5.18* 5.85* 8.16*   Cardiac Enzymes:  Recent Labs Lab 03/27/16 0017 03/27/16 0625  TROPONINI <0.03 <0.03   BNP (last 3 results) No results for input(s): PROBNP in the last 8760 hours. HbA1C: No results for input(s): HGBA1C in the last 72 hours. CBG:  Recent Labs Lab 03/27/16 0459 03/27/16 0603 03/27/16 0704 03/27/16 0809 03/27/16 0909  GLUCAP 156* 148* 144* 144* 113*   Lipid Profile: No results for input(s): CHOL, HDL, LDLCALC, TRIG, CHOLHDL, LDLDIRECT in the last 72 hours. Thyroid Function Tests: No results for input(s): TSH, T4TOTAL, FREET4, T3FREE, THYROIDAB in the last 72 hours. Anemia Panel: No results for input(s): VITAMINB12, FOLATE, FERRITIN, TIBC, IRON, RETICCTPCT in the last 72 hours. Urine analysis:    Component Value Date/Time   COLORURINE YELLOW 03/26/2016 1253   APPEARANCEUR CLEAR 03/26/2016 1253   LABSPEC 1.026 03/26/2016 1253   PHURINE 5.0 03/26/2016 1253   GLUCOSEU >1000* 03/26/2016 1253   HGBUR NEGATIVE 03/26/2016 1253   BILIRUBINUR NEGATIVE 03/26/2016 1253   BILIRUBINUR n 06/16/2015 1244   KETONESUR >80* 03/26/2016 1253   PROTEINUR NEGATIVE 03/26/2016 1253   PROTEINUR n 06/16/2015 1244   UROBILINOGEN 0.2 07/30/2015 1415   UROBILINOGEN 0.2 06/16/2015 1244   NITRITE NEGATIVE 03/26/2016 1253   NITRITE n 06/16/2015 1244   LEUKOCYTESUR NEGATIVE 03/26/2016 1253   Sepsis Labs: @LABRCNTIP (procalcitonin:4,lacticidven:4)  ) Recent Results (from the past 240 hour(s))  Blood culture (routine x 2)     Status: None   Collection Time: 03/17/16  2:50 PM  Result Value Ref Range Status   Specimen Description BLOOD LEFT ANTECUBITAL  Final   Special Requests BOTTLES DRAWN AEROBIC AND ANAEROBIC 5CC EA  Final   Culture   Final    NO GROWTH 5 DAYS Performed at [facility]    Report Status 03/22/2016 FINAL  Final  Blood culture (routine x 2)      Status: None   Collection Time: 03/17/16  3:10 PM  Result Value Ref Range Status   Specimen Description BLOOD RIGHT ARM  Final   Special Requests BOTTLES DRAWN AEROBIC AND ANAEROBIC 5CC EA  Final   Culture   Final    NO GROWTH 5 DAYS Performed at [facility]    Report Status 03/22/2016 FINAL  Final  MRSA PCR Screening     Status: None   Collection Time: 03/17/16  6:46 PM  Result Value Ref Range Status   MRSA by PCR NEGATIVE NEGATIVE Final    Comment:        The GeneXpert MRSA Assay (FDA approved for NASAL specimens only), is one component of a comprehensive MRSA colonization surveillance program. It is not intended to diagnose MRSA infection nor to guide or monitor treatment for MRSA infections.   Urine culture  Status: None   Collection Time: 03/18/16  6:27 AM  Result Value Ref Range Status   Specimen Description URINE, CATHETERIZED  Final   Special Requests NONE  Final   Culture   Final    NO GROWTH 2 DAYS Performed at [facility]    Report Status 03/20/2016 FINAL  Final  MRSA PCR Screening     Status: None   Collection Time: 03/27/16  3:49 AM  Result Value Ref Range Status   MRSA by PCR NEGATIVE NEGATIVE Final    Comment:        The GeneXpert MRSA Assay (FDA approved for NASAL specimens only), is one component of a comprehensive MRSA colonization surveillance program. It is not intended to diagnose MRSA infection nor to guide or monitor treatment for MRSA infections.       Studies: Dg Chest 2 View  03/26/2016  CLINICAL DATA:  Hyperglycemia. EXAM: CHEST  2 VIEW COMPARISON:  March 17, 2016. FINDINGS: The heart size and mediastinal contours are within normal limits. Both lungs are clear. Status post cardiac valve repair. No pneumothorax or pleural effusion is noted. The visualized skeletal structures are unremarkable. IMPRESSION: No active cardiopulmonary disease. Electronically Signed   By: Lupita Raider, M.D.   On: 03/26/2016 14:33     Scheduled Meds: . citalopram  40 mg Oral Daily  . furosemide  20 mg Intravenous Q12H  . insulin regular  0-10 Units Intravenous TID WC  . pantoprazole (PROTONIX) IV  40 mg Intravenous Q12H  . pravastatin  20 mg Oral Daily  . sodium chloride flush  3 mL Intravenous Q12H  . traZODone  50 mg Oral QHS    Continuous Infusions: . dextrose 5 % and 0.45% NaCl 50 mL/hr at 03/27/16 0805  . insulin (NOVOLIN-R) infusion 1 Units/hr (03/27/16 1222)     LOS: 1 day   Time spent: 25 minutes  Hollice Espy, MD [facility] Pager [phone]  If 7PM-7AM, please contact night-coverage [email or web address] Password [email or web address] 03/27/2016, 12:42 PM

## 2016-03-27 NOTE — Consult Note (Addendum)
   [facility] CM Inpatient Consult   03/27/2016  LECIA ESPERANZA 1961-06-05 [id number]   Patient active with [facility] Care Management program. Despite best efforts, patient continues to be readmitted with DKA. She continues to state she is taking her insulin. Ms. Kimpel has declined SNF placement on numerous occassions in the past. Requesting psych consult on patient please due to her lack of judgement and questionable capacity. Spoke with patient's nurse prior to bedside visit.Micah Flesher to bedside to speak with Ms. Beaird. Affect flat. She states " I am okay, everything is okay". She repeatedly stated that. Text page sent to Dr. Rito Ehrlich to request psychiatric consult while inpatient. Patient currently in ICU on an insulin gtt. Spoke with inpatient RNCM and will follow up with inpatient Licensed CSW as well. Will continue to follow.  Raiford Noble, MSN-Ed, RN,BSN [facility] Liaison [phone]

## 2016-03-27 NOTE — Care Management Note (Addendum)
Case Management Note  Patient Details  Name: Madison [name] MRN: [id number] Date of Birth: 20-Jul-1961  Subjective/Objective:        hyperglycemia            Action/Plan:Date:  Mar 27, 2016 Chart reviewed for concurrent status and case management needs. Will continue to follow patient for changes and needs: Marcelle Smiling, BSN, [place], [place]   [phone]   Expected Discharge Date:       [id number]           Expected Discharge Plan:  Home/Self Care  In-House Referral:  NA  Discharge planning Services  CM Consult  Post Acute Care Choice:  NA Choice offered to:  NA  DME Arranged:    DME Agency:     HH Arranged:    HH Agency:     Status of Service:  In process, will continue to follow  Medicare Important Message Given:    Date Medicare IM Given:    Medicare IM give by:    Date Additional Medicare IM Given:    Additional Medicare Important Message give by:     If discussed at Long Length of Stay Meetings, dates discussed:    Additional Comments:  Golda Acre, RN 03/27/2016, 10:30 AM

## 2016-03-28 ENCOUNTER — Ambulatory Visit: Payer: Commercial Managed Care - HMO | Admitting: Adult Health

## 2016-03-28 ENCOUNTER — Other Ambulatory Visit: Payer: Self-pay

## 2016-03-28 DIAGNOSIS — N179 Acute kidney failure, unspecified: Secondary | ICD-10-CM

## 2016-03-28 DIAGNOSIS — Z7901 Long term (current) use of anticoagulants: Secondary | ICD-10-CM

## 2016-03-28 DIAGNOSIS — F4323 Adjustment disorder with mixed anxiety and depressed mood: Secondary | ICD-10-CM | POA: Diagnosis present

## 2016-03-28 LAB — CBC
HEMATOCRIT: 35.7 % — AB (ref 36.0–46.0)
Hemoglobin: 11.4 g/dL — ABNORMAL LOW (ref 12.0–15.0)
MCH: 27.5 pg (ref 26.0–34.0)
MCHC: 31.9 g/dL (ref 30.0–36.0)
MCV: 86.2 fL (ref 78.0–100.0)
Platelets: 345 10*3/uL (ref 150–400)
RBC: 4.14 MIL/uL (ref 3.87–5.11)
RDW: 20.5 % — AB (ref 11.5–15.5)
WBC: 9.9 10*3/uL (ref 4.0–10.5)

## 2016-03-28 LAB — GLUCOSE, CAPILLARY
GLUCOSE-CAPILLARY: 115 mg/dL — AB (ref 65–99)
Glucose-Capillary: 154 mg/dL — ABNORMAL HIGH (ref 65–99)
Glucose-Capillary: <10 mg/dL — CL (ref 65–99)
Glucose-Capillary: 139 mg/dL — ABNORMAL HIGH (ref 65–99)
Glucose-Capillary: 134 mg/dL — ABNORMAL HIGH (ref 65–99)
Glucose-Capillary: 168 mg/dL — ABNORMAL HIGH (ref 65–99)

## 2016-03-28 LAB — BASIC METABOLIC PANEL
Anion gap: 14 (ref 5–15)
BUN: 8 mg/dL (ref 6–20)
CHLORIDE: 100 mmol/L — AB (ref 101–111)
CO2: 20 mmol/L — AB (ref 22–32)
CREATININE: 0.98 mg/dL (ref 0.44–1.00)
Calcium: 8.8 mg/dL — ABNORMAL LOW (ref 8.9–10.3)
GFR calc Af Amer: >60 mL/min (ref >60)
GFR calc non Af Amer: >60 mL/min (ref >60)
Glucose, Bld: 153 mg/dL — ABNORMAL HIGH (ref 65–99)
POTASSIUM: 3.2 mmol/L — AB (ref 3.5–5.1)
SODIUM: 134 mmol/L — AB (ref 135–145)

## 2016-03-28 LAB — PROTIME-INR
INR: 5.81 (ref 0.00–1.49)
Prothrombin Time: 50.3 seconds — ABNORMAL HIGH (ref 11.6–15.2)

## 2016-03-28 MED ORDER — POTASSIUM CHLORIDE CRYS ER 20 MEQ PO TBCR
40.0000 meq | EXTENDED_RELEASE_TABLET | Freq: Once | ORAL | Status: AC
  Administered 2016-03-28: 40 meq via ORAL
  Filled 2016-03-28: qty 2

## 2016-03-28 MED ORDER — INSULIN DETEMIR 100 UNIT/ML ~~LOC~~ SOLN
8.0000 [IU] | Freq: Every day | SUBCUTANEOUS | Status: DC
  Administered 2016-03-28: 8 [IU] via SUBCUTANEOUS
  Filled 2016-03-28: qty 0.08

## 2016-03-28 MED ORDER — INSULIN DETEMIR 100 UNIT/ML ~~LOC~~ SOLN: 20.0000 [IU] | Freq: Every day | SUBCUTANEOUS | Status: DC

## 2016-03-28 MED ORDER — INSULIN DETEMIR 100 UNIT/ML ~~LOC~~ SOLN
12.0000 [IU] | Freq: Every day | SUBCUTANEOUS | Status: DC
  Filled 2016-03-28: qty 0.12

## 2016-03-28 MED ORDER — DEXTROSE 50 % IV SOLN
INTRAVENOUS | Status: AC
  Administered 2016-03-28: 06:00:00
  Filled 2016-03-28: qty 50

## 2016-03-28 MED ORDER — INSULIN ASPART 100 UNIT/ML ~~LOC~~ SOLN
0.0000 [IU] | Freq: Three times a day (TID) | SUBCUTANEOUS | Status: DC
  Administered 2016-03-28: 1 [IU] via SUBCUTANEOUS
  Administered 2016-03-29: 3 [IU] via SUBCUTANEOUS
  Administered 2016-03-29: 7 [IU] via SUBCUTANEOUS
  Administered 2016-03-30 – 2016-03-31 (×5): 1 [IU] via SUBCUTANEOUS
  Administered 2016-03-31 – 2016-04-01 (×2): 3 [IU] via SUBCUTANEOUS
  Administered 2016-04-01: 2 [IU] via SUBCUTANEOUS
  Administered 2016-04-02: 5 [IU] via SUBCUTANEOUS
  Administered 2016-04-03: 2 [IU] via SUBCUTANEOUS

## 2016-03-28 NOTE — Consult Note (Signed)
[facility] Consult   Reason for Consult:  Non compliance with medication treatment and capacity evaluation Referring Physician:  Dr. Tyrell Antonio Patient Identification: Madison [name] MRN:  [id number] Principal Diagnosis: Adjustment disorder with mixed anxiety and depressed mood Diagnosis:   Patient Active Problem List   Diagnosis Date Noted  . Adjustment disorder with mixed anxiety and depressed mood [F43.23] 03/28/2016  . HLD (hyperlipidemia) [E78.5] 03/27/2016  . AKI (acute kidney injury) ([place]) [N17.9] 03/27/2016  . Chronic combined systolic and diastolic congestive heart failure ([place]) [I50.42]   . Diabetic ketoacidosis ([place]) [E13.10] 03/26/2016  . Type 1 diabetes, HbA1c goal < 7% (HCC) [E10.9]   . Hypotension [I95.9] 03/08/2016  . Uncontrolled type 1 diabetes mellitus with ketoacidotic coma ([place]) [E10.11]   . Acute encephalopathy [G93.40] 02/28/2016  . Hyponatremia: Pseudo [E87.1] 02/28/2016  . Lactic acidosis [E87.2]   . Diabetic ketoacidosis with coma associated with type 1 diabetes mellitus ([place]) [E10.11]   . Hypoglycemia [E16.2]   . Chronic systolic heart failure ([place]) [I50.22]   . Esophageal reflux [K21.9]   . S/P AVR (aortic valve replacement) [Z95.4]   . ARF (acute renal failure) ([place]) [N17.9] 01/22/2016  . CHF (congestive heart failure) ([place]) [I50.9] 01/22/2016  . Diabetic ketoacidosis without coma associated with type 1 diabetes mellitus ([place]) [E10.10]   . DKA, type 1 ([place]) [E10.10] 11/23/2015  . Constipation [K59.00] 11/23/2015  . Leukocytosis [D72.829]   . Pressure ulcer [L89.90] 08/02/2015  . DKA (diabetic ketoacidoses) ([place]) [E13.10] 07/30/2015  . Diabetic ketoacidosis with coma associated with other specified diabetes mellitus ([place]) [E13.11]   . Closed left ankle fracture [S82.892A] 07/01/2015  . Trimalleolar fracture of left ankle [S82.852A] 07/01/2015  . Hyperkalemia [E87.5]   . Insomnia [G47.00] 06/16/2015  . Normocytic anemia [D64.9]  01/31/2015  . Acute on chronic combined systolic and diastolic CHF (congestive heart failure) ([place]) [I50.43]   . Essential hypertension [I10]   . Elevated troponin [R79.89]   . Diabetes type 1, uncontrolled ([place]) [E10.65]   . Demand ischemia ([place]) [I24.8]   . S/P aortic valve replacement [Z95.4]   . Hypokalemia [E87.6]   . NSTEMI (non-ST elevated myocardial infarction) ([place]) [I21.4] 12/08/2014  . Blood poisoning ([place]) [A41.9]   . Supratherapeutic INR [R79.1]   . Severe sepsis with acute organ dysfunction ([place]) [A41.9, R65.20] 12/03/2014  . Spastic hemiplegia affecting nondominant side ([place]) [G81.10] 11/29/2014  . Dysphagia, pharyngoesophageal phase [R13.14] 10/04/2014  . Sinus tachycardia ([place]) [R00.0] 09/30/2014  . Depression [F32.9] 04/29/2014  . CVA (cerebral infarction) [I63.9] 03/12/2014  . Acute respiratory failure ([place]) [J96.00] 03/12/2014  . Chronic anticoagulation [Z79.01] 07/28/2013  . Long term (current) use of anticoagulants [Z79.01] 07/28/2013  . CAD (coronary artery disease) [I25.10]   . Aortic stenosis [I35.0]   . Hyperlipidemia [E78.5]   . Rheumatoid arthritis ([place]) [M06.9]     Total Time spent with patient: 1 hour  Subjective:   Madison [name] is a 55 y.o. female patient admitted with depression and non compliance with medication therapy and questionable cognitive deficits.Marland Kitchen  HPI: Madison [name] is a 55 year old female, seen chart reviewed and case discussed with the psychiatric LCSW and Dr. Tyrell Antonio. Patient appeared lying in her bed, awake, alert oriented to time place person and situation. Patient reportedly admitted to the [facility] with 4 days of nausea and vomiting and then found to be in diabetic ketoacidosis Patient has multiple admissions regarding diabetic ketoacidosis. Patient denies change in her diet or insulin doses Patient stated she has  been suffering with diabetes mellitus since age 38 and she has a sister was been suffering with  diabetes since a study. Patient also reportedly suffered with the stroke about 21 years ago which made her left side of the body weak and numb. Patient has 47 years old daughter who was Adopted to her cousin Due to her disability. Patient has been separated from her husband 5 years ago. Patient has been living with a roommate. Patient reportedly compliant with her medication management as prescribed by her primary care physician and denied being noncompliant with medication. Patient has intact cognition including orientation, concentration and 3 out of 3 for immediate memory and 2 out of 3 for delayed memory and has intact language functions and abstract thinking. Reportedly Patient was placed in nursing facility for managing her diabetes about a year ago but she did not like it because staff for not able to let her go to the bathroom on her own even though she can walk around and has no fall risk.  Medical history: Patient with past medical history of chronic systolic/diastolic heart failure, previous CVA, chronic anticoagulation and poorly controlled diabetes mellitus type 1 on insulin who is had numerous admissions over the past few months for DKA. Patient lives at home with roommates. There is some question as to whether or not she is probably taking her medications. Patient was admitted on 5/9 with complaints of nausea and vomiting and found to be in DKA again. She was placed in stepdown unit and started on IV fluids plus insulin drip and by the following morning, CBGs better although anion gap took a little longer to resolve. She answers questions very straightforward and there is question of cognitive deficit especially recall memory.   Past Psychiatric History: NO [facility] admissions.  Risk to Self: Is patient at risk for suicide?: No Risk to Others:   Prior Inpatient Therapy:   Prior Outpatient Therapy:    Past Medical History:  Past Medical History  Diagnosis Date  . CAD (coronary artery disease)    . Obesity   . Hypercholesteremia   . HTN (hypertension)   . Dyslipidemia   . Aortic stenosis   . Thrombophlebitis   . Heart murmur   . Myocardial infarction ([place]) 12/2014  . Pneumonia 11/2014; 12/2014  . GERD (gastroesophageal reflux disease)   . Stroke syndrome [facility]) 1995; 2001    "when my son was born; problems w/speech and L hand since then" (01/19/2015)  . Anxiety   . Depression   . Allergy   . CHF (congestive heart failure) ([place])   . Rheumatoid arthritis(714.0)     "hands" (07/01/2015)  . Diabetes ([place]) dx'd 1982    "went straight to insulin"    Past Surgical History  Procedure Laterality Date  . Cesarean section  1995  . Foot fracture surgery    . Knee arthroscopy Right   . Neuroplasty / transposition median nerve at carpal tunnel    . Left heart catheterization with coronary angiogram N/A 12/08/2014    Procedure: LEFT HEART CATHETERIZATION WITH CORONARY ANGIOGRAM;  Surgeon: Wellington Hampshire, MD;  Location: [facility] CATH LAB;  Service: Cardiovascular;  Laterality: N/A;  . Percutaneous coronary stent intervention (pci-s) N/A 12/09/2014    Procedure: PERCUTANEOUS CORONARY STENT INTERVENTION (PCI-S);  Surgeon: Leonie Man, MD;  Location: [facility] CATH LAB;  Service: Cardiovascular;  Laterality: N/A;  . Cardiac catheterization  12/08/2014  . Coronary angioplasty with stent placement  12/09/2014  . Aortic valve replacement (avr)/coronary artery  bypass grafting (cabg)  "2014"    Archie Endo 03/08/2014  . Fracture surgery    . Tubal ligation  1995  . Cataract extraction Left   . Coronary artery bypass graft    . Orif ankle fracture Left 07/04/2015    Procedure: OPEN REDUCTION INTERNAL FIXATION (ORIF)  TRIMAL ANKLE FRACTURE;  Surgeon: Leandrew Koyanagi, MD;  Location: [facility];  Service: Orthopedics;  Laterality: Left;   Family History:  Family History  Problem Relation Age of Onset  . Stroke    . Heart disease Mother   . Heart disease Sister    Family Psychiatric  History: Not significant for mental  illness. Social History:  History  Alcohol Use No     History  Drug Use No    Social History   Social History  . Marital Status: Divorced    Spouse Name: N/A  . Number of Children: N/A  . Years of Education: N/A   Social History Main Topics  . Smoking status: Never Smoker   . Smokeless tobacco: Never Used  . Alcohol Use: No  . Drug Use: No  . Sexual Activity: No   Other Topics Concern  . None   Social History Narrative   Divroced and lives at home.    Has two sons who live in [place] at this time. (21&8 y/o).    Has a cat    Walks a lot.       She is having great success with her diet       Working with exercises that were given to her by PT/OT.    Additional Social History:    Allergies:   Allergies  Allergen Reactions  . Adhesive [Tape] Other (See Comments)    Can burn skin if left on too long  . Celebrex [Celecoxib] Rash  . Detrol [Tolterodine] Hives    Labs:  Results for orders placed or performed during the hospital encounter of 03/26/16 (from the past 48 hour(s))  CBG monitoring, ED     Status: Abnormal   Collection Time: 03/26/16 12:37 PM  Result Value Ref Range   Glucose-Capillary >600 (HH) 65 - 99 mg/dL  Urinalysis, Routine w reflex microscopic (not at [facility])     Status: Abnormal   Collection Time: 03/26/16 12:53 PM  Result Value Ref Range   Color, Urine YELLOW YELLOW   APPearance CLEAR CLEAR   Specific Gravity, Urine 1.026 1.005 - 1.030   pH 5.0 5.0 - 8.0   Glucose, UA >1000 (A) NEGATIVE mg/dL   Hgb urine dipstick NEGATIVE NEGATIVE   Bilirubin Urine NEGATIVE NEGATIVE   Ketones, ur >80 (A) NEGATIVE mg/dL   Protein, ur NEGATIVE NEGATIVE mg/dL   Nitrite NEGATIVE NEGATIVE   Leukocytes, UA NEGATIVE NEGATIVE  Urine microscopic-add on     Status: Abnormal   Collection Time: 03/26/16 12:53 PM  Result Value Ref Range   Squamous Epithelial / LPF 0-5 (A) NONE SEEN   WBC, UA NONE SEEN 0 - 5 WBC/hpf   RBC / HPF NONE SEEN 0 - 5 RBC/hpf   Bacteria, UA  NONE SEEN NONE SEEN  Urine rapid drug screen (hosp performed)     Status: None   Collection Time: 03/26/16 12:53 PM  Result Value Ref Range   Opiates NONE DETECTED NONE DETECTED   Cocaine NONE DETECTED NONE DETECTED   Benzodiazepines NONE DETECTED NONE DETECTED   Amphetamines NONE DETECTED NONE DETECTED   Tetrahydrocannabinol NONE DETECTED NONE DETECTED   Barbiturates NONE DETECTED NONE  DETECTED    Comment:        DRUG SCREEN FOR MEDICAL PURPOSES ONLY.  IF CONFIRMATION IS NEEDED FOR ANY PURPOSE, NOTIFY LAB WITHIN 5 DAYS.        LOWEST DETECTABLE LIMITS FOR URINE DRUG SCREEN Drug Class       Cutoff (ng/mL) Amphetamine      1000 Barbiturate      200 Benzodiazepine   818 Tricyclics       299 Opiates          300 Cocaine          300 THC              50   Creatinine, urine, random     Status: None   Collection Time: 03/26/16 12:53 PM  Result Value Ref Range   Creatinine, Urine 17.61 mg/dL    Comment: Performed at [facility]  Sodium, urine, random     Status: None   Collection Time: 03/26/16 12:53 PM  Result Value Ref Range   Sodium, Ur 65 mmol/L    Comment: Performed at [facility]  CBC     Status: Abnormal   Collection Time: 03/26/16  1:34 PM  Result Value Ref Range   WBC 9.5 4.0 - 10.5 K/uL   RBC 4.34 3.87 - 5.11 MIL/uL   Hemoglobin 12.0 12.0 - 15.0 g/dL   HCT 38.5 36.0 - 46.0 %   MCV 88.7 78.0 - 100.0 fL   MCH 27.6 26.0 - 34.0 pg   MCHC 31.2 30.0 - 36.0 g/dL   RDW 20.0 (H) 11.5 - 15.5 %   Platelets 439 (H) 150 - 400 K/uL  Comprehensive metabolic panel     Status: Abnormal   Collection Time: 03/26/16  1:34 PM  Result Value Ref Range   Sodium 127 (L) 135 - 145 mmol/L   Potassium 4.7 3.5 - 5.1 mmol/L   Chloride 90 (L) 101 - 111 mmol/L   CO2 9 (L) 22 - 32 mmol/L   Glucose, Bld 673 (HH) 65 - 99 mg/dL    Comment: CRITICAL RESULT CALLED TO, READ BACK BY AND VERIFIED WITH: TEUP,M. RN '@1439'  ON 5.8.2017 BY MCCOY,N.     BUN 17 6 - 20 mg/dL    Creatinine, Ser 1.50 (H) 0.44 - 1.00 mg/dL   Calcium 9.1 8.9 - 10.3 mg/dL   Total Protein 7.8 6.5 - 8.1 g/dL   Albumin 4.1 3.5 - 5.0 g/dL   AST 27 15 - 41 U/L   ALT 22 14 - 54 U/L   Alkaline Phosphatase 172 (H) 38 - 126 U/L   Total Bilirubin 1.9 (H) 0.3 - 1.2 mg/dL   GFR calc non Af Amer 38 (L) >60 mL/min   GFR calc Af Amer 44 (L) >60 mL/min    Comment: (NOTE) The eGFR has been calculated using the CKD EPI equation. This calculation has not been validated in all clinical situations. eGFR's persistently <60 mL/min signify possible Chronic Kidney Disease.    Anion gap 28 (H) 5 - 15  Protime-INR     Status: Abnormal   Collection Time: 03/26/16  1:34 PM  Result Value Ref Range   Prothrombin Time 49.1 (H) 11.6 - 15.2 seconds   INR 5.85 (HH) 0.00 - 1.49    Comment: CRITICAL RESULT CALLED TO, READ BACK BY AND VERIFIED WITH: Pilar Jarvis [id number] @ 1438 BY J SCOTTON   Blood gas, venous     Status: Abnormal   Collection Time:  03/26/16  2:48 PM  Result Value Ref Range   pH, Ven 7.218 (L) 7.250 - 7.300   pCO2, Ven 27.4 (L) 45.0 - 50.0 mmHg   pO2, Ven 37.0 31.0 - 45.0 mmHg   Bicarbonate 10.7 (L) 20.0 - 24.0 mEq/L   TCO2 9.3 0 - 100 mmol/L   Acid-base deficit 16.1 (H) 0.0 - 2.0 mmol/L   O2 Saturation 55.0 %   Patient temperature 98.6    Collection site VEIN    Drawn by COLLECTED BY NURSE    Sample type VENOUS   CBG monitoring, ED     Status: Abnormal   Collection Time: 03/26/16  4:44 PM  Result Value Ref Range   Glucose-Capillary 461 (H) 65 - 99 mg/dL  CBG monitoring, ED     Status: Abnormal   Collection Time: 03/26/16  6:43 PM  Result Value Ref Range   Glucose-Capillary 523 (H) 65 - 99 mg/dL  Basic metabolic panel     Status: Abnormal   Collection Time: 03/26/16  7:45 PM  Result Value Ref Range   Sodium 135 135 - 145 mmol/L    Comment: RESULT REPEATED AND VERIFIED DELTA CHECK NOTED    Potassium 4.3 3.5 - 5.1 mmol/L   Chloride 104 101 - 111 mmol/L   CO2 8 (L) 22 - 32  mmol/L   Glucose, Bld 435 (H) 65 - 99 mg/dL   BUN 14 6 - 20 mg/dL   Creatinine, Ser 1.26 (H) 0.44 - 1.00 mg/dL   Calcium 8.6 (L) 8.9 - 10.3 mg/dL   GFR calc non Af Amer 47 (L) >60 mL/min   GFR calc Af Amer 55 (L) >60 mL/min    Comment: (NOTE) The eGFR has been calculated using the CKD EPI equation. This calculation has not been validated in all clinical situations. eGFR's persistently <60 mL/min signify possible Chronic Kidney Disease.    Anion gap 23 (H) 5 - 15    Comment: RESULT REPEATED AND VERIFIED  Lipase, blood     Status: None   Collection Time: 03/26/16  7:48 PM  Result Value Ref Range   Lipase 19 11 - 51 U/L  CBG monitoring, ED     Status: Abnormal   Collection Time: 03/26/16  8:07 PM  Result Value Ref Range   Glucose-Capillary 344 (H) 65 - 99 mg/dL  Glucose, capillary     Status: Abnormal   Collection Time: 03/26/16  8:40 PM  Result Value Ref Range   Glucose-Capillary 253 (H) 65 - 99 mg/dL   Comment 1 Notify RN    Comment 2 Document in Chart    Comment 3 Glucose Stabilizer   Glucose, capillary     Status: Abnormal   Collection Time: 03/26/16  9:53 PM  Result Value Ref Range   Glucose-Capillary 170 (H) 65 - 99 mg/dL   Comment 1 Notify RN    Comment 2 Document in Chart    Comment 3 Glucose Stabilizer   Glucose, capillary     Status: Abnormal   Collection Time: 03/26/16 11:04 PM  Result Value Ref Range   Glucose-Capillary 105 (H) 65 - 99 mg/dL   Comment 1 Notify RN   Glucose, capillary     Status: Abnormal   Collection Time: 03/27/16 12:09 AM  Result Value Ref Range   Glucose-Capillary 130 (H) 65 - 99 mg/dL  Basic metabolic panel     Status: Abnormal   Collection Time: 03/27/16 12:17 AM  Result Value Ref Range   Sodium 139  135 - 145 mmol/L   Potassium 4.3 3.5 - 5.1 mmol/L   Chloride 107 101 - 111 mmol/L   CO2 15 (L) 22 - 32 mmol/L   Glucose, Bld 137 (H) 65 - 99 mg/dL   BUN 13 6 - 20 mg/dL   Creatinine, Ser 1.02 (H) 0.44 - 1.00 mg/dL   Calcium 8.8 (L) 8.9  - 10.3 mg/dL   GFR calc non Af Amer >60 >60 mL/min   GFR calc Af Amer >60 >60 mL/min    Comment: (NOTE) The eGFR has been calculated using the CKD EPI equation. This calculation has not been validated in all clinical situations. eGFR's persistently <60 mL/min signify possible Chronic Kidney Disease.    Anion gap 17 (H) 5 - 15  Troponin I (q 6hr x 3)     Status: None   Collection Time: 03/27/16 12:17 AM  Result Value Ref Range   Troponin I <0.03 <0.031 ng/mL    Comment:        NO INDICATION OF MYOCARDIAL INJURY.   Glucose, capillary     Status: Abnormal   Collection Time: 03/27/16  1:19 AM  Result Value Ref Range   Glucose-Capillary 182 (H) 65 - 99 mg/dL  Glucose, capillary     Status: Abnormal   Collection Time: 03/27/16  2:22 AM  Result Value Ref Range   Glucose-Capillary 213 (H) 65 - 99 mg/dL  Basic metabolic panel     Status: Abnormal   Collection Time: 03/27/16  3:39 AM  Result Value Ref Range   Sodium 137 135 - 145 mmol/L   Potassium 4.4 3.5 - 5.1 mmol/L   Chloride 106 101 - 111 mmol/L   CO2 13 (L) 22 - 32 mmol/L   Glucose, Bld 190 (H) 65 - 99 mg/dL   BUN 13 6 - 20 mg/dL   Creatinine, Ser 1.01 (H) 0.44 - 1.00 mg/dL   Calcium 8.9 8.9 - 10.3 mg/dL   GFR calc non Af Amer >60 >60 mL/min   GFR calc Af Amer >60 >60 mL/min    Comment: (NOTE) The eGFR has been calculated using the CKD EPI equation. This calculation has not been validated in all clinical situations. eGFR's persistently <60 mL/min signify possible Chronic Kidney Disease.    Anion gap 18 (H) 5 - 15  CBC     Status: Abnormal   Collection Time: 03/27/16  3:39 AM  Result Value Ref Range   WBC 11.7 (H) 4.0 - 10.5 K/uL    Comment: WHITE COUNT CONFIRMED ON SMEAR   RBC 4.57 3.87 - 5.11 MIL/uL   Hemoglobin 12.7 12.0 - 15.0 g/dL   HCT 38.3 36.0 - 46.0 %   MCV 83.8 78.0 - 100.0 fL   MCH 27.8 26.0 - 34.0 pg   MCHC 33.2 30.0 - 36.0 g/dL   RDW 20.4 (H) 11.5 - 15.5 %   Platelets 241 150 - 400 K/uL  Protime-INR      Status: Abnormal   Collection Time: 03/27/16  3:39 AM  Result Value Ref Range   Prothrombin Time 63.0 (H) 11.6 - 15.2 seconds   INR 8.16 (HH) 0.00 - 1.49    Comment: CRITICAL RESULT CALLED TO, READ BACK BY AND VERIFIED WITH: SUGGS RN AT 0449 ON 05.09.17 BY SHUEA   MRSA PCR Screening     Status: None   Collection Time: 03/27/16  3:49 AM  Result Value Ref Range   MRSA by PCR NEGATIVE NEGATIVE    Comment:  The GeneXpert MRSA Assay (FDA approved for NASAL specimens only), is one component of a comprehensive MRSA colonization surveillance program. It is not intended to diagnose MRSA infection nor to guide or monitor treatment for MRSA infections.   Glucose, capillary     Status: Abnormal   Collection Time: 03/27/16  3:55 AM  Result Value Ref Range   Glucose-Capillary 161 (H) 65 - 99 mg/dL  Glucose, capillary     Status: Abnormal   Collection Time: 03/27/16  4:59 AM  Result Value Ref Range   Glucose-Capillary 156 (H) 65 - 99 mg/dL  Glucose, capillary     Status: Abnormal   Collection Time: 03/27/16  6:03 AM  Result Value Ref Range   Glucose-Capillary 148 (H) 65 - 99 mg/dL  Troponin I (q 6hr x 3)     Status: None   Collection Time: 03/27/16  6:25 AM  Result Value Ref Range   Troponin I <0.03 <0.031 ng/mL    Comment:        NO INDICATION OF MYOCARDIAL INJURY.   Glucose, capillary     Status: Abnormal   Collection Time: 03/27/16  7:04 AM  Result Value Ref Range   Glucose-Capillary 144 (H) 65 - 99 mg/dL  Glucose, capillary     Status: Abnormal   Collection Time: 03/27/16  8:09 AM  Result Value Ref Range   Glucose-Capillary 144 (H) 65 - 99 mg/dL  Basic metabolic panel     Status: Abnormal   Collection Time: 03/27/16  8:30 AM  Result Value Ref Range   Sodium 135 135 - 145 mmol/L   Potassium 3.9 3.5 - 5.1 mmol/L   Chloride 108 101 - 111 mmol/L   CO2 15 (L) 22 - 32 mmol/L   Glucose, Bld 119 (H) 65 - 99 mg/dL   BUN 11 6 - 20 mg/dL   Creatinine, Ser 0.90 0.44 -  1.00 mg/dL   Calcium 8.6 (L) 8.9 - 10.3 mg/dL   GFR calc non Af Amer >60 >60 mL/min   GFR calc Af Amer >60 >60 mL/min    Comment: (NOTE) The eGFR has been calculated using the CKD EPI equation. This calculation has not been validated in all clinical situations. eGFR's persistently <60 mL/min signify possible Chronic Kidney Disease.    Anion gap 12 5 - 15  Brain natriuretic peptide     Status: None   Collection Time: 03/27/16  8:32 AM  Result Value Ref Range   B Natriuretic Peptide 73.7 0.0 - 100.0 pg/mL  Glucose, capillary     Status: Abnormal   Collection Time: 03/27/16  9:09 AM  Result Value Ref Range   Glucose-Capillary 113 (H) 65 - 99 mg/dL  Glucose, capillary     Status: Abnormal   Collection Time: 03/27/16 10:15 AM  Result Value Ref Range   Glucose-Capillary 178 (H) 65 - 99 mg/dL  Glucose, capillary     Status: Abnormal   Collection Time: 03/27/16 11:21 AM  Result Value Ref Range   Glucose-Capillary 172 (H) 65 - 99 mg/dL  Glucose, capillary     Status: Abnormal   Collection Time: 03/27/16 12:20 PM  Result Value Ref Range   Glucose-Capillary 164 (H) 65 - 99 mg/dL  Glucose, capillary     Status: Abnormal   Collection Time: 03/27/16  1:25 PM  Result Value Ref Range   Glucose-Capillary 167 (H) 65 - 99 mg/dL  Glucose, capillary     Status: Abnormal   Collection Time: 03/27/16  2:27 PM  Result Value Ref Range   Glucose-Capillary 175 (H) 65 - 99 mg/dL  Basic metabolic panel     Status: Abnormal   Collection Time: 03/27/16  2:58 PM  Result Value Ref Range   Sodium 132 (L) 135 - 145 mmol/L   Potassium 3.6 3.5 - 5.1 mmol/L   Chloride 101 101 - 111 mmol/L   CO2 21 (L) 22 - 32 mmol/L   Glucose, Bld 165 (H) 65 - 99 mg/dL   BUN 9 6 - 20 mg/dL   Creatinine, Ser 0.90 0.44 - 1.00 mg/dL   Calcium 8.4 (L) 8.9 - 10.3 mg/dL   GFR calc non Af Amer >60 >60 mL/min   GFR calc Af Amer >60 >60 mL/min    Comment: (NOTE) The eGFR has been calculated using the CKD EPI equation. This  calculation has not been validated in all clinical situations. eGFR's persistently <60 mL/min signify possible Chronic Kidney Disease.    Anion gap 10 5 - 15  Glucose, capillary     Status: Abnormal   Collection Time: 03/27/16  3:35 PM  Result Value Ref Range   Glucose-Capillary 150 (H) 65 - 99 mg/dL  Glucose, capillary     Status: Abnormal   Collection Time: 03/27/16  4:42 PM  Result Value Ref Range   Glucose-Capillary 156 (H) 65 - 99 mg/dL  Glucose, capillary     Status: Abnormal   Collection Time: 03/27/16  5:53 PM  Result Value Ref Range   Glucose-Capillary 134 (H) 65 - 99 mg/dL  Glucose, capillary     Status: Abnormal   Collection Time: 03/27/16  6:16 PM  Result Value Ref Range   Glucose-Capillary 130 (H) 65 - 99 mg/dL  Glucose, capillary     Status: None   Collection Time: 03/27/16  9:14 PM  Result Value Ref Range   Glucose-Capillary 73 65 - 99 mg/dL  Glucose, capillary     Status: Abnormal   Collection Time: 03/28/16  5:38 AM  Result Value Ref Range   Glucose-Capillary <10 (LL) 65 - 99 mg/dL  Glucose, capillary     Status: Abnormal   Collection Time: 03/28/16  5:59 AM  Result Value Ref Range   Glucose-Capillary <10 (LL) 65 - 99 mg/dL   Comment 1 Notify RN   Glucose, capillary     Status: Abnormal   Collection Time: 03/28/16  6:16 AM  Result Value Ref Range   Glucose-Capillary 154 (H) 65 - 99 mg/dL  Protime-INR     Status: Abnormal   Collection Time: 03/28/16  7:13 AM  Result Value Ref Range   Prothrombin Time 50.3 (H) 11.6 - 15.2 seconds   INR 5.81 (HH) 0.00 - 1.49    Comment: CRITICAL RESULT CALLED TO, READ BACK BY AND VERIFIED WITH: Scot Jun RN AT 0800 ON 05.10.17 BY SHUEA   Basic metabolic panel     Status: Abnormal   Collection Time: 03/28/16  7:13 AM  Result Value Ref Range   Sodium 134 (L) 135 - 145 mmol/L   Potassium 3.2 (L) 3.5 - 5.1 mmol/L   Chloride 100 (L) 101 - 111 mmol/L   CO2 20 (L) 22 - 32 mmol/L   Glucose, Bld 153 (H) 65 - 99 mg/dL   BUN  8 6 - 20 mg/dL   Creatinine, Ser 0.98 0.44 - 1.00 mg/dL   Calcium 8.8 (L) 8.9 - 10.3 mg/dL   GFR calc non Af Amer >60 >60 mL/min   GFR calc Af Amer >60 >60  mL/min    Comment: (NOTE) The eGFR has been calculated using the CKD EPI equation. This calculation has not been validated in all clinical situations. eGFR's persistently <60 mL/min signify possible Chronic Kidney Disease.    Anion gap 14 5 - 15  CBC     Status: Abnormal   Collection Time: 03/28/16  7:13 AM  Result Value Ref Range   WBC 9.9 4.0 - 10.5 K/uL   RBC 4.14 3.87 - 5.11 MIL/uL   Hemoglobin 11.4 (L) 12.0 - 15.0 g/dL   HCT 35.7 (L) 36.0 - 46.0 %   MCV 86.2 78.0 - 100.0 fL   MCH 27.5 26.0 - 34.0 pg   MCHC 31.9 30.0 - 36.0 g/dL   RDW 20.5 (H) 11.5 - 15.5 %   Platelets 345 150 - 400 K/uL  Glucose, capillary     Status: Abnormal   Collection Time: 03/28/16  7:32 AM  Result Value Ref Range   Glucose-Capillary 139 (H) 65 - 99 mg/dL   Comment 1 Notify RN     Current Facility-Administered Medications  Medication Dose Route Frequency Provider Last Rate Last Dose  . albuterol (PROVENTIL) (2.5 MG/3ML) 0.083% nebulizer solution 2.5 mg  2.5 mg Nebulization Q2H PRN Norval Morton, MD      . citalopram (CELEXA) tablet 40 mg  40 mg Oral Daily Rondell Charmayne Sheer, MD   40 mg at 03/28/16 1004  . dextrose 50 % solution 25 mL  25 mL Intravenous PRN Norval Morton, MD      . HYDROcodone-acetaminophen (NORCO/VICODIN) 5-325 MG per tablet 1 tablet  1 tablet Oral Q4H PRN Norval Morton, MD   1 tablet at 03/27/16 2031  . insulin aspart (novoLOG) injection 0-15 Units  0-15 Units Subcutaneous TID WC Annita Brod, MD   2 Units at 03/28/16 [phone]  . insulin aspart (novoLOG) injection 0-5 Units  0-5 Units Subcutaneous QHS Annita Brod, MD   0 Units at 03/27/16 2138  . insulin detemir (LEVEMIR) injection 20 Units  20 Units Subcutaneous QHS Belkys A Regalado, MD      . ondansetron (ZOFRAN) tablet 4 mg  4 mg Oral Q6H PRN Norval Morton, MD        Or  . ondansetron (ZOFRAN) injection 4 mg  4 mg Intravenous Q6H PRN Norval Morton, MD   4 mg at 03/26/16 2129  . pantoprazole (PROTONIX) injection 40 mg  40 mg Intravenous Q12H Norval Morton, MD   40 mg at 03/28/16 1002  . phenol (CHLORASEPTIC) mouth spray 1 spray  1 spray Mouth/Throat PRN Annita Brod, MD   1 spray at 03/27/16 2327  . pravastatin (PRAVACHOL) tablet 20 mg  20 mg Oral Daily Norval Morton, MD   20 mg at 03/28/16 1004  . sodium chloride flush (NS) 0.9 % injection 3 mL  3 mL Intravenous Q12H Rondell Charmayne Sheer, MD   3 mL at 03/28/16 1004  . traZODone (DESYREL) tablet 50 mg  50 mg Oral QHS Norval Morton, MD   50 mg at 03/27/16 2156    Musculoskeletal: Strength & Muscle Tone: decreased Gait & Station: unable to stand Patient leans: N/A  Psychiatric Specialty Exam: ROS  No Fever-chills, No Headache, No changes with Vision or hearing, reports vertigo No problems swallowing food or Liquids, No Chest pain, Cough or Shortness of Breath, No Abdominal pain, No Nausea or Vommitting, Bowel movements are regular, No Blood in stool or Urine, No dysuria, No new  skin rashes or bruises, No new joints pains-aches,  No new weakness, tingling, numbness in any extremity, No recent weight gain or loss, No polyuria, polydypsia or polyphagia,   A full 10 point Review of Systems was done, except as stated above, all other Review of Systems were negative.  Blood pressure 117/60, pulse 78, temperature 97.4 F (36.3 C), temperature source Oral, resp. rate 16, height 5' (1.524 m), weight 56.382 kg (124 lb 4.8 oz), SpO2 100 %.Body mass index is 24.28 kg/(m^2).  General Appearance: Casual  Eye Contact::  Good  Speech:  Clear and Coherent  Volume:  Normal  Mood:  Depressed  Affect:  Appropriate and Congruent  Thought Process:  Coherent and Goal Directed  Orientation:  Full (Time, Place, and Person)  Thought Content:  WDL  Suicidal Thoughts:  No  Homicidal Thoughts:  No   Memory:  Immediate;   Good Recent;   Fair Remote;   Fair  Judgement:  Intact  Insight:  Good  Psychomotor Activity:  Decreased  Concentration:  Good  Recall:  [place] of Knowledge:Good  Language: Good  Akathisia:  Negative  Handed:  Right  AIMS (if indicated):     Assets:  Communication Skills Desire for Improvement Financial Resources/Insurance Housing Leisure Time Resilience Social Support Transportation  ADL's:  Intact  Cognition: WNL  Sleep:      Treatment Plan Summary: Patient has been positively responding to her current medication management and has intact cognitions and reportedly compliant with medication and following the directions as instructed by her primary care physician's. Patient could not explain why she has been readmitted multiple times with the diabetic ketoacidosis and staff are having, [facility] support, who has been checking with her Medication compliance daily.  Patient has intact cognitions and meet criteria for capacity to make her own medical decisions and living arrangements.  Recommended no psychiatric medication management  Appreciate psychiatric consultation and we sign off as of today Please contact [phone] or [phone] if needs further assistance   Disposition: Patient does not meet criteria for psychiatric inpatient admission. Supportive therapy provided about ongoing stressors.  Durward Parcel., MD 03/28/2016 11:41 AM

## 2016-03-28 NOTE — Progress Notes (Signed)
Follow-up CBG 154.

## 2016-03-28 NOTE — Progress Notes (Signed)
Hypoglycemic Event  CBG: <10  Treatment: D50 IV 50 mL, apple juice  Symptoms: Sweaty  Follow-up CBG: Time:0605 CBG Result:10  Possible Reasons for Event: Inadequate meal intake, Unknown  Comments/MD notified: MD paged Patient alert, oriented at time of critical value.    Shalini Mair J

## 2016-03-28 NOTE — Progress Notes (Signed)
CRITICAL VALUE ALERT  Critical value received:  INR 5.81  Date of notification:  03/28/16  Time of notification:  0804  Critical value read back:Yes.    Nurse who received alert:  Floyde Parkins, RN  MD notified (1st page):  Sunnie Nielsen, MD  Time of first page:  0806  MD notified (2nd page):  Time of second page:  Responding MD:  Sunnie Nielsen, MD  Time MD responded:  [phone]

## 2016-03-28 NOTE — Progress Notes (Signed)
PROGRESS NOTE  Madison [name] IEP:[id number] DOB: 10-Nov-1961 DOA: 03/26/2016 PCP: [name], NP  HPI/Recap of past 24 hours: Patient is a 55 year old female with past medical history of chronic systolic/diastolic heart failure, previous CVA, chronic anticoagulation and poorly controlled diabetes mellitus type 1 on insulin who is had numerous admissions over the past few months for DKA. Patient lives at home with roommates. There is some question as to whether or not she is probably taking her medications.  Patient was admitted on 5/9 with complaints of nausea and vomiting and found to be in DKA again. She was placed in stepdown unit and started on IV fluids plus insulin drip and by the following morning, CBGs better although anion gap took a little longer to resolve.  Patient herself states that she is feeling better this morning. She answers questions very straightforward and there is question of cognitive deficit. She denies any complaints.  Assessment/Plan: DKA (diabetic ketoacidoses) (HCC): A recurrent problem. Psychiatry consulted for competency.  Off insulin Gtt.   Hypoglycemia, Diabetes; Will decrease levemir from 25 to 8 units  due to hypoglycemia this am.  Follow CBG.   Chronic anticoagulation, mechanical aortic valve;  Supratherapeutic INR: Unclear etiology. Continue to hold coumadin.  INR trending down from   Chronic systolic/diastolic heart failure: Currently. Actually euvolemic. Will monitor closely for volume overload   Essential hypertension: Blood pressure stable  Insomnia  AKI (acute kidney injury) (HCC): Secondary to DKA and hypovolemia. Resolved with IV fluids  Code Status: Full code   Family Communication: none at bedside.    Disposition Plan: awaiting psych evaluation,    Consultants:  Psychiatry   Procedures:  None   Antimicrobials:  None   DVT prophylaxis:  Lovenox   Objective: Filed Vitals:   03/27/16 1851 03/27/16 2111 03/28/16 0533  03/28/16 1452  BP: 145/60 136/84 117/60 125/55  Pulse: 86 105 78 81  Temp: 98.9 F (37.2 C) 99.1 F (37.3 C) 97.4 F (36.3 C) 98.6 F (37 C)  TempSrc: Oral Oral Oral Oral  Resp: 18 18 16 18   Height: 5' (1.524 m)     Weight: 56.382 kg (124 lb 4.8 oz)     SpO2: 100% 99% 100% 98%    Intake/Output Summary (Last 24 hours) at 03/28/16 1526 Last data filed at 03/28/16 0801  Gross per 24 hour  Intake    290 ml  Output    250 ml  Net     40 ml   Filed Weights   03/26/16 1309 03/27/16 1851  Weight: 63.504 kg (140 lb) 56.382 kg (124 lb 4.8 oz)    Exam:   General:  Alert and oriented 3, no acute distress   Cardiovascular: Regular rate and rhythm, S1-S2, borderline tachycardia   Respiratory: Clear to Auscultation bilaterally   Abdomen: Soft, nontender, nondistended, hypoactive bowel sounds   Musculoskeletal: No clubbing or cyanosis or edema   Skin: No skin breaks, tears or lesions  Psychiatry: Patient seems appropriate, but questionable cognitive deficits    Data Reviewed: CBC:  Recent Labs Lab 03/24/16 0139 03/26/16 1334 03/27/16 0339 03/28/16 0713  WBC 16.0* 9.5 11.7* 9.9  HGB 10.5* 12.0 12.7 11.4*  HCT 32.1* 38.5 38.3 35.7*  MCV 83.6 88.7 83.8 86.2  PLT 358 439* 241 345   Basic Metabolic Panel:  Recent Labs Lab 03/27/16 0017 03/27/16 0339 03/27/16 0830 03/27/16 1458 03/28/16 0713  NA 139 137 135 132* 134*  K 4.3 4.4 3.9 3.6 3.2*  CL 107 106  108 101 100*  CO2 15* 13* 15* 21* 20*  GLUCOSE 137* 190* 119* 165* 153*  BUN 13 13 11 9 8   CREATININE 1.02* 1.01* 0.90 0.90 0.98  CALCIUM 8.8* 8.9 8.6* 8.4* 8.8*   GFR: Estimated Creatinine Clearance: 51.1 mL/min (by C-G formula based on Cr of 0.98). Liver Function Tests:  Recent Labs Lab 03/24/16 0139 03/26/16 1334  AST 17 27  ALT 16 22  ALKPHOS 136* 172*  BILITOT 1.3* 1.9*  PROT 6.8 7.8  ALBUMIN 3.7 4.1    Recent Labs Lab 03/24/16 0139 03/26/16 1948  LIPASE 48 19   No results for  input(s): AMMONIA in the last 168 hours. Coagulation Profile:  Recent Labs Lab 03/24/16 0139 03/26/16 1334 03/27/16 0339 03/28/16 0713  INR 5.18* 5.85* 8.16* 5.81*   Cardiac Enzymes:  Recent Labs Lab 03/27/16 0017 03/27/16 0625  TROPONINI <0.03 <0.03   BNP (last 3 results) No results for input(s): PROBNP in the last 8760 hours. HbA1C: No results for input(s): HGBA1C in the last 72 hours. CBG:  Recent Labs Lab 03/28/16 0538 03/28/16 0559 03/28/16 0616 03/28/16 0732 03/28/16 1156  GLUCAP <10* <10* 154* 139* 115*   Lipid Profile: No results for input(s): CHOL, HDL, LDLCALC, TRIG, CHOLHDL, LDLDIRECT in the last 72 hours. Thyroid Function Tests: No results for input(s): TSH, T4TOTAL, FREET4, T3FREE, THYROIDAB in the last 72 hours. Anemia Panel: No results for input(s): VITAMINB12, FOLATE, FERRITIN, TIBC, IRON, RETICCTPCT in the last 72 hours. Urine analysis:    Component Value Date/Time   COLORURINE YELLOW 03/26/2016 1253   APPEARANCEUR CLEAR 03/26/2016 1253   LABSPEC 1.026 03/26/2016 1253   PHURINE 5.0 03/26/2016 1253   GLUCOSEU >1000* 03/26/2016 1253   HGBUR NEGATIVE 03/26/2016 1253   BILIRUBINUR NEGATIVE 03/26/2016 1253   BILIRUBINUR n 06/16/2015 1244   KETONESUR >80* 03/26/2016 1253   PROTEINUR NEGATIVE 03/26/2016 1253   PROTEINUR n 06/16/2015 1244   UROBILINOGEN 0.2 07/30/2015 1415   UROBILINOGEN 0.2 06/16/2015 1244   NITRITE NEGATIVE 03/26/2016 1253   NITRITE n 06/16/2015 1244   LEUKOCYTESUR NEGATIVE 03/26/2016 1253   Sepsis Labs: @LABRCNTIP (procalcitonin:4,lacticidven:4)  ) Recent Results (from the past 240 hour(s))  MRSA PCR Screening     Status: None   Collection Time: 03/27/16  3:49 AM  Result Value Ref Range Status   MRSA by PCR NEGATIVE NEGATIVE Final    Comment:        The GeneXpert MRSA Assay (FDA approved for NASAL specimens only), is one component of a comprehensive MRSA colonization surveillance program. It is not intended to  diagnose MRSA infection nor to guide or monitor treatment for MRSA infections.       Studies: No results found.  Scheduled Meds: . citalopram  40 mg Oral Daily  . insulin aspart  0-15 Units Subcutaneous TID WC  . insulin detemir  12 Units Subcutaneous QHS  . pantoprazole (PROTONIX) IV  40 mg Intravenous Q12H  . pravastatin  20 mg Oral Daily  . sodium chloride flush  3 mL Intravenous Q12H  . traZODone  50 mg Oral QHS    Continuous Infusions:     LOS: 2 days   Time spent: 25 minutes  , MD [facility] Pager [phone]  If 7PM-7AM, please contact night-coverage [email or web address] Password [email or web address] 03/28/2016, 3:26 PM

## 2016-03-28 NOTE — Progress Notes (Signed)
ANTICOAGULATION CONSULT NOTE - Follow Up Consult  Pharmacy Consult for warfarin Indication: aortic valve replacement  Allergies  Allergen Reactions  . Adhesive [Tape] Other (See Comments)    Can burn skin if left on too long  . Celebrex [Celecoxib] Rash  . Detrol [Tolterodine] Hives    Patient Measurements: Height: 5' (152.4 cm) Weight: 124 lb 4.8 oz (56.382 kg) IBW/kg (Calculated) : 45.5   Vital Signs: Temp: 97.4 F (36.3 C) (05/10 0533) Temp Source: Oral (05/10 0533) BP: 117/60 mmHg (05/10 0533) Pulse Rate: 78 (05/10 0533)  Labs:  Recent Labs  03/26/16 1334  03/27/16 0017 03/27/16 0339 03/27/16 0625 03/27/16 0830 03/27/16 1458 03/28/16 0713  HGB 12.0  --   --  12.7  --   --   --  11.4*  HCT 38.5  --   --  38.3  --   --   --  35.7*  PLT 439*  --   --  241  --   --   --  345  LABPROT 49.1*  --   --  63.0*  --   --   --  50.3*  INR 5.85*  --   --  8.16*  --   --   --  5.81*  CREATININE 1.50*  < > 1.02* 1.01*  --  0.90 0.90 0.98  TROPONINI  --   --  <0.03  --  <0.03  --   --   --   < > = values in this interval not displayed.  Estimated Creatinine Clearance: 51.1 mL/min (by C-G formula based on Cr of 0.98).    Assessment: 55 y.o. 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 INR was supratherapeutic on admission at 5.86.  Home warfarin dosage noted as 7.5 mg daily per anticoagulation clinic. INR remains supratherapeutic @ 5.81 on 5/10 s/p Vitamin K SQ 5mg  5/9. No bleeding documented or reported by pt. CBC remains relatively stable. Pt is on carb modified diet now eating about 50% of meals. No significant drug-drug interactions noted.  Goal of Therapy:   INR 2.5-3.5   Plan:  Continue to hold warfarin tonight Check daily PT/INR Monitor for s/sx bleeding  7/9 03/28/2016,12:57 PM

## 2016-03-28 NOTE — Patient Outreach (Signed)
Telephone call made to Fabian November Inpatient Case Manageer [phone]) regarding for an update. Dahlia Client stated she and her colleagues plan to see patietn today for psychiatric consult. Reviewed community care coordination plan with Lorelle Formosa and services provided.  Lorelle Formosa given this RNCM's contact information if further assistance is needed.  Plan: Transition of care once patient is discharged from acute care servcies.

## 2016-03-28 NOTE — Progress Notes (Addendum)
LCSW following for psychiatric consult and needs. Patient has [facility] in place in the home Patient has home health also in place with aide.  Additional Information:  Spoke with Ellyn Hack:  [phone] Patient recently lost custody of her 55 year old son, due to frequent hospitalizations, DKA, and son running away, thus CPS placed son with pt's sister Tracery.  Patient does have the right to see and spend time with son, but this has been very difficult and upsetting to patient per Pam.  Patient has declined recently since losing custody and not being compliant with her diabetes management/DKA. [facility] reports patient understands educations of diet, medicines, and needs, but refuses to comply with taking medications, not attending [facility], has aides with home health bring her cheesecake and milkshakes causing her to readmit back to hospital.   Patient does have some income through medicaid disability. She is not active with mental health services at this time. Patient has PCP: Ulice Brilliant on [address] prescribe medications: Celexa.  She is refusing SNF per [facility] who has worked with patient in the community.    [facility] feels patient is depressed regarding loss of custody of son and this may be contributing to her reasons for noncompliance.    Chart reviewed:  Patient lives alone and has had aide services. Has been active with [facility] as well. Patient continues to be noncompliant with her diet as well. [facility] RNCM has made several home visits and numerous contacts in trying to keep patient on track.  Reviewed chart and received email from [facility] regarding patient. Appears patient has a long psychiatric history, but unclear of current behaviors in home and providers other than her noncompliance and questions regarding capacity. Call placed to SW who emailed this Clinical research associate and message left to obtain more information for consult.  LCSW will continue to follow and assist with  disposition once more information is know regarding patient and impact of care.  Deretha Emory, MSW Clinical Social Work: [employer] [phone]

## 2016-03-29 ENCOUNTER — Other Ambulatory Visit: Payer: Self-pay

## 2016-03-29 ENCOUNTER — Other Ambulatory Visit: Payer: Self-pay | Admitting: *Deleted

## 2016-03-29 ENCOUNTER — Ambulatory Visit: Payer: Self-pay

## 2016-03-29 DIAGNOSIS — E1010 Type 1 diabetes mellitus with ketoacidosis without coma: Principal | ICD-10-CM

## 2016-03-29 LAB — GLUCOSE, CAPILLARY
GLUCOSE-CAPILLARY: 336 mg/dL — AB (ref 65–99)
GLUCOSE-CAPILLARY: 330 mg/dL — AB (ref 65–99)
GLUCOSE-CAPILLARY: 64 mg/dL — AB (ref 65–99)
GLUCOSE-CAPILLARY: 242 mg/dL — AB (ref 65–99)
Glucose-Capillary: 295 mg/dL — ABNORMAL HIGH (ref 65–99)

## 2016-03-29 LAB — BASIC METABOLIC PANEL
Anion gap: 10 (ref 5–15)
BUN: 11 mg/dL (ref 6–20)
CHLORIDE: 100 mmol/L — AB (ref 101–111)
CO2: 23 mmol/L (ref 22–32)
Calcium: 9.1 mg/dL (ref 8.9–10.3)
Creatinine, Ser: 0.72 mg/dL (ref 0.44–1.00)
GFR calc Af Amer: >60 mL/min (ref >60)
GFR calc non Af Amer: >60 mL/min (ref >60)
GLUCOSE: 377 mg/dL — AB (ref 65–99)
POTASSIUM: 4.4 mmol/L (ref 3.5–5.1)
Sodium: 133 mmol/L — ABNORMAL LOW (ref 135–145)

## 2016-03-29 LAB — PROTIME-INR
INR: 2.54 — ABNORMAL HIGH (ref 0.00–1.49)
Prothrombin Time: 27 seconds — ABNORMAL HIGH (ref 11.6–15.2)

## 2016-03-29 MED ORDER — WARFARIN SODIUM 5 MG PO TABS
5.0000 mg | ORAL_TABLET | Freq: Once | ORAL | Status: AC
  Administered 2016-03-29: 5 mg via ORAL
  Filled 2016-03-29: qty 1

## 2016-03-29 MED ORDER — WARFARIN - PHARMACIST DOSING INPATIENT: Freq: Every day | Status: DC

## 2016-03-29 MED ORDER — PANTOPRAZOLE SODIUM 40 MG PO TBEC
40.0000 mg | DELAYED_RELEASE_TABLET | Freq: Two times a day (BID) | ORAL | Status: DC
  Administered 2016-03-29 – 2016-04-03 (×10): 40 mg via ORAL
  Filled 2016-03-29 (×13): qty 1

## 2016-03-29 MED ORDER — INSULIN DETEMIR 100 UNIT/ML ~~LOC~~ SOLN
20.0000 [IU] | Freq: Every day | SUBCUTANEOUS | Status: DC
  Filled 2016-03-29: qty 0.2

## 2016-03-29 MED ORDER — INSULIN DETEMIR 100 UNIT/ML ~~LOC~~ SOLN
15.0000 [IU] | Freq: Every day | SUBCUTANEOUS | Status: DC
  Administered 2016-03-29 – 2016-04-02 (×5): 15 [IU] via SUBCUTANEOUS
  Filled 2016-03-29 (×5): qty 0.15

## 2016-03-29 NOTE — Progress Notes (Signed)
PROGRESS NOTE  Madison [name] [name]:[id number] DOB: 08-Sep-1961 DOA: 03/26/2016 PCP: [name], NP  HPI/Recap of past 24 hours: Patient is a 55 year old female with past medical history of chronic systolic/diastolic heart failure, previous CVA, chronic anticoagulation and poorly controlled diabetes mellitus type 1 on insulin who is had numerous admissions over the past few months for DKA. Patient lives at home with roommates. There is some question as to whether or not she is probably taking her medications.  Patient was admitted on 5/9 with complaints of nausea and vomiting and found to be in DKA again. She was placed in stepdown unit and started on IV fluids plus insulin drip and by the following morning, CBGs better although anion gap took a little longer to resolve.  Patient was evaluated by Psych 5-10 and was found to have capacity.  Patient feeling well this morning. She now agree to go to SNF for rehab.   Assessment/Plan: DKA (diabetic ketoacidoses) (HCC): A recurrent problem.  Off insulin Gtt.  Resolved.   Hypoglycemia, Diabetes; cbg this am at 300. Will increase levemir to  15  Chronic anticoagulation, mechanical aortic valve;  Supratherapeutic INR: Unclear etiology. Continue to hold coumadin.  INR trending down from  INR at 2.5, resume coumadin.   Chronic systolic/diastolic heart failure: Currently. Actually euvolemic. Will monitor closely for volume overload   Essential hypertension: Blood pressure stable  Insomnia  AKI (acute kidney injury) (HCC): Secondary to DKA and hypovolemia. Resolved with IV fluids  Code Status: Full code   Family Communication: none at bedside.    Disposition Plan: follow CBG adjust insulin as needed, resume coumadin,. PT evaluation    Consultants:  Psychiatry   Procedures:  None   Antimicrobials:  None   DVT prophylaxis:  Lovenox   Objective: Filed Vitals:   03/28/16 0533 03/28/16 1452 03/28/16 2146 03/29/16 0444  BP:  117/60 125/55 129/60 128/66  Pulse: 78 81 83 74  Temp: 97.4 F (36.3 C) 98.6 F (37 C) 98.2 F (36.8 C) 97.5 F (36.4 C)  TempSrc: Oral Oral Oral Oral  Resp: 16 18 18 20   Height:      Weight:  56.5 kg (124 lb 9 oz)  56.473 kg (124 lb 8 oz)  SpO2: 100% 98% 100% 99%    Intake/Output Summary (Last 24 hours) at 03/29/16 1437 Last data filed at 03/29/16 0900  Gross per 24 hour  Intake    440 ml  Output    300 ml  Net    140 ml   Filed Weights   03/27/16 1851 03/28/16 1452 03/29/16 0444  Weight: 56.382 kg (124 lb 4.8 oz) 56.5 kg (124 lb 9 oz) 56.473 kg (124 lb 8 oz)    Exam:   General:  Alert and oriented 3, no acute distress   Cardiovascular: Regular rate and rhythm, S1-S2, borderline tachycardia   Respiratory: Clear to Auscultation bilaterally   Abdomen: Soft, nontender, nondistended, hypoactive bowel sounds   Musculoskeletal: No clubbing or cyanosis or edema   Skin: No skin breaks, tears or lesions  Psychiatry: Patient seems appropriate   Data Reviewed: CBC:  Recent Labs Lab 03/24/16 0139 03/26/16 1334 03/27/16 0339 03/28/16 0713  WBC 16.0* 9.5 11.7* 9.9  HGB 10.5* 12.0 12.7 11.4*  HCT 32.1* 38.5 38.3 35.7*  MCV 83.6 88.7 83.8 86.2  PLT 358 439* 241 345   Basic Metabolic Panel:  Recent Labs Lab 03/27/16 0339 03/27/16 0830 03/27/16 1458 03/28/16 0713 03/29/16 0541  NA 137 135 132*  134* 133*  K 4.4 3.9 3.6 3.2* 4.4  CL 106 108 101 100* 100*  CO2 13* 15* 21* 20* 23  GLUCOSE 190* 119* 165* 153* 377*  BUN 13 11 9 8 11   CREATININE 1.01* 0.90 0.90 0.98 0.72  CALCIUM 8.9 8.6* 8.4* 8.8* 9.1   GFR: Estimated Creatinine Clearance: 62.6 mL/min (by C-G formula based on Cr of 0.72). Liver Function Tests:  Recent Labs Lab 03/24/16 0139 03/26/16 1334  AST 17 27  ALT 16 22  ALKPHOS 136* 172*  BILITOT 1.3* 1.9*  PROT 6.8 7.8  ALBUMIN 3.7 4.1    Recent Labs Lab 03/24/16 0139 03/26/16 1948  LIPASE 48 19   No results for input(s): AMMONIA in  the last 168 hours. Coagulation Profile:  Recent Labs Lab 03/24/16 0139 03/26/16 1334 03/27/16 0339 03/28/16 0713 03/29/16 0541  INR 5.18* 5.85* 8.16* 5.81* 2.54*   Cardiac Enzymes:  Recent Labs Lab 03/27/16 0017 03/27/16 0625  TROPONINI <0.03 <0.03   BNP (last 3 results) No results for input(s): PROBNP in the last 8760 hours. HbA1C: No results for input(s): HGBA1C in the last 72 hours. CBG:  Recent Labs Lab 03/28/16 1628 03/28/16 2144 03/29/16 0443 03/29/16 0753 03/29/16 1241  GLUCAP 134* 168* 336* 330* 64*   Lipid Profile: No results for input(s): CHOL, HDL, LDLCALC, TRIG, CHOLHDL, LDLDIRECT in the last 72 hours. Thyroid Function Tests: No results for input(s): TSH, T4TOTAL, FREET4, T3FREE, THYROIDAB in the last 72 hours. Anemia Panel: No results for input(s): VITAMINB12, FOLATE, FERRITIN, TIBC, IRON, RETICCTPCT in the last 72 hours. Urine analysis:    Component Value Date/Time   COLORURINE YELLOW 03/26/2016 1253   APPEARANCEUR CLEAR 03/26/2016 1253   LABSPEC 1.026 03/26/2016 1253   PHURINE 5.0 03/26/2016 1253   GLUCOSEU >1000* 03/26/2016 1253   HGBUR NEGATIVE 03/26/2016 1253   BILIRUBINUR NEGATIVE 03/26/2016 1253   BILIRUBINUR n 06/16/2015 1244   KETONESUR >80* 03/26/2016 1253   PROTEINUR NEGATIVE 03/26/2016 1253   PROTEINUR n 06/16/2015 1244   UROBILINOGEN 0.2 07/30/2015 1415   UROBILINOGEN 0.2 06/16/2015 1244   NITRITE NEGATIVE 03/26/2016 1253   NITRITE n 06/16/2015 1244   LEUKOCYTESUR NEGATIVE 03/26/2016 1253   Sepsis Labs: @LABRCNTIP (procalcitonin:4,lacticidven:4)  ) Recent Results (from the past 240 hour(s))  MRSA PCR Screening     Status: None   Collection Time: 03/27/16  3:49 AM  Result Value Ref Range Status   MRSA by PCR NEGATIVE NEGATIVE Final    Comment:        The GeneXpert MRSA Assay (FDA approved for NASAL specimens only), is one component of a comprehensive MRSA colonization surveillance program. It is not intended to  diagnose MRSA infection nor to guide or monitor treatment for MRSA infections.       Studies: No results found.  Scheduled Meds: . citalopram  40 mg Oral Daily  . insulin aspart  0-9 Units Subcutaneous TID WC  . insulin detemir  20 Units Subcutaneous QHS  . pantoprazole  40 mg Oral BID  . pravastatin  20 mg Oral Daily  . sodium chloride flush  3 mL Intravenous Q12H  . traZODone  50 mg Oral QHS    Continuous Infusions:     LOS: 3 days   Time spent: 25 minutes  , MD [facility] Pager [phone]  If 7PM-7AM, please contact night-coverage [email or web address] Password [facility] 03/29/2016, 2:37 PM

## 2016-03-29 NOTE — Patient Outreach (Signed)
[facility]) Care Management  03/29/2016  Madison [name] July 26, 1961 [id number]   After thorough review of patient's EMR (Electronic Medical Record) in EPIC, CSW noted that patient remains hospitalized for further monitoring of Chronic Systolic/Diastolic Heart Failure, previous CVA, Chronic Anticoagulation and poorly controlled Diabetes Mellitus Type 1, on insulin.  Patient presented to the Emergency Department at [facility] on May 8th, due to nausea and vomiting X 3 days.  A Psychiatric consult was performed by Dr. Leata Mouse for non compliance with prescription medication treatment and capacity evaluation.  Dr. Elsie Saas findings are as follows: Patient has been diagnosed with Adjustment Disorder with Mixed Anxiety and Depressed Mood. Patient reportedly compliant with her medication management as prescribed by her primary care physician, denying noncompliance.   Patient has intact cognition including orientation, concentration and 3 out of 3 for immediate memory and 2 out of 3 for delayed memory and has intact language functions and abstract thinking.  Patient has multiple admissions regarding Diabetic Ketoacidosis.  Patient denies change in her diet or insulin doses. Patient stated she has been suffering with Diabetes Mellitus since age 53. Patient suffered a Stroke about 21 years ago, which resulted in left-sided weakness and numbness.   Patient has 53-year-old daughter who was adopted by her cousin.   Patient separated from her husband 5 years ago.  Patient currently living with a roommate. Patient refusing placement into a higher level of care, reporting mistreatment during previous skilled nursing placement. Patient denies homicidal and/or suicidal ideation. Patient answers questions very straightforward and there is question of cognitive deficit especially recall memory.  No psychiatric management recommended and psychiatry has signed off.  CSW  will continue to follow along to assess and assist with possible discharge planning needs and services. Danford Bad, BSW, MSW, LCSW  Licensed Restaurant manager, fast food Health System  Mailing [place] [address] [place], [place] [address] Physical Address-[address], [place], [place] [address] Toll Free Main # [phone] Fax # [phone] Cell # [phone]  Fax # [phone]  [name] [employer]  Discrimination is Against the [employer]. and its subsidiaries comply with applicable Federal civil rights laws and do not discriminate on the basis of race, color, national origin, age, disability, or sex. [employer]. and its subsidiaries do not exclude people or treat them differently because of race, color, national origin, age, disability, or sex.    [employer]. and its subsidiaries provide:  . Free auxiliary aids and services, such as qualified sign language interpreters, video remote interpretation, and written information in other formats to people with disabilities when such auxiliary aids and services are necessary to ensure an equal opportunity to participate. . Free language services to people whose primary language is not English when those services are necessary to provide meaningful access, such as translated documents or oral interpretation.    If you need these services, call [phone] or if you use a TTY, call 711.   If you believe that [employer]. and its subsidiaries have failed to provide these services or discriminated in another way on the basis of race, color, national origin, age, disability, or sex, you can file a Theatre manager with:   Discrimination Grievances  [address] [address]  [place], [place] [address]   If you need help filing a grievance, call [phone] or if you use a TTY, call 711.  You can also file a civil rights complaint with the U.S. Department of Health  and Health and safety inspector,  Office for [employer] Rights electronically through [employer] for Civil Rights Complaint Portal, available at [email or web address], or by mail or phone at:   U.S. Department of Health and Human Services  [address], [place]  Room [phone], [facility] Building  [place], [place]. [address]  [phone], [phone] (TDD)  Complaint forms are available at [email or web address]             [facility] Multi-Language Interpreter Services  English: ATTENTION: If you do not speak English, language assistance services, free of charge, are available to you. Call [phone]  (TTY: 711).  Espaol (Spanish): ATENCIN: si habla espaol, tiene a su disposicin servicios gratuitos de asistencia lingstica. Llame al [phone] (TTY: 711).  ???? (Chinese): ?????????????????????????????? [phone] (TTY:711??  Ti?ng Vi?t (Vietnamese): CH : N?u b?n ni Ti?ng Vi?t, c cc d?ch v? h? tr? ngn ng? mi?n ph dnh cho b?n. G?i s? [phone] (TTY: 711).  ??? ([place]): ?? : ???? ????? ?? , ?? ?? ???? ??? ???? ? ???? . [phone] (TTY: 711)??? ??? ???? .  Tagalog (Tagalog - Filipino): PAUNAWA: Kung nagsasalita ka ng Tagalog, maaari kang gumamit ng mga serbisyo ng tulong sa wika nang walang bayad. [place] sa [phone] (TTY: 711).   [place]): :      ,      .  [phone] (: 711).  Kreyl Ayisyen (Jersey): ATANSYON: Si w pale Abe People, gen svis d pou lang ki disponib gratis pou ou. Rele [phone] (TTY: 711).  [name]: ATTENTION : Si vous parlez franais, des services d'aide linguistique vous sont proposs gratuitement. Appelez le [phone] (ATS : 711).  Polski (Polish): UWAGA: Jeeli mwisz po polsku, moesz skorzysta z bezpatnej pomocy jzykowej. Zadzwo pod numer [phone] (TTY: 711).  Portugus  ([place]ong[place]): ATENO: Se fala portugus, encontram-se disponveis servios lingusticos, grtis. Ligue para [phone] (TTY: 711).   Italiano ([place]): ATTENZIONE: In caso la lingua parlata sia l'italiano, sono disponibili servizi di assistenza linguistica gratuiti. Chiamare il numero [phone] (TTY: 711).  Tobie Lords ([place]rones[place]): ACHTUNG: Wenn Sie Deutsch sprechen, stehen Ihnen kostenlos sprachliche Hilfsdienstleistungen zur [employer]. Rufnummer: [phone] (TTY: 711).   (Arabic): [phone]   .            : .)711 :   (  ??? (Japanese): ??????????????????????????????????[phone] ?TTY?711?????????????????  ? (Farsi): [phone]  . ?   ? ?  ? ? ?~ ?  ?    : .??  (TTY: 711)  Din Bizaad (Navajo): D77 baa ak0 n7n7zin: D77 saad bee y1n7[ti'go [name], saad bee 1k1'1n7da'1wo'd66', t'11 [name] n1 h0l=, koj8' h0d77lnih [phone] (TTY: 711).

## 2016-03-29 NOTE — Care Management Important Message (Signed)
Important Message  Patient Details  Name: SHAKIARA LUKIC MRN: [id number] Date of Birth: 1961/04/02   Medicare Important Message Given:  Yes    Haskell Flirt 03/29/2016, 10:23 AMImportant Message  Patient Details  Name: AUDINE MANGIONE MRN: [id number] Date of Birth: 11/23/1960   Medicare Important Message Given:  Yes    Haskell Flirt 03/29/2016, 10:23 AM

## 2016-03-29 NOTE — Progress Notes (Signed)
ANTICOAGULATION CONSULT NOTE - Follow Up Consult  Pharmacy Consult for warfarin Indication: aortic valve replacement  Allergies  Allergen Reactions  . Adhesive [Tape] Other (See Comments)    Can burn skin if left on too long  . Celebrex [Celecoxib] Rash  . Detrol [Tolterodine] Hives    Patient Measurements: Height: 5' (152.4 cm) Weight: 124 lb 8 oz (56.473 kg) IBW/kg (Calculated) : 45.5   Vital Signs: Temp: 97.5 F (36.4 C) (05/11 0444) Temp Source: Oral (05/11 0444) BP: 128/66 mmHg (05/11 0444) Pulse Rate: 74 (05/11 0444)  Labs:  Recent Labs  03/27/16 0017 03/27/16 3818 03/27/16 0625  03/27/16 1458 03/28/16 0713 03/29/16 0541  HGB  --  12.7  --   --   --  11.4*  --   HCT  --  38.3  --   --   --  35.7*  --   PLT  --  241  --   --   --  345  --   LABPROT  --  63.0*  --   --   --  50.3* 27.0*  INR  --  8.16*  --   --   --  5.81* 2.54*  CREATININE 1.02* 1.01*  --   < > 0.90 0.98 0.72  TROPONINI <0.03  --  <0.03  --   --   --   --   < > = values in this interval not displayed.  Estimated Creatinine Clearance: 62.6 mL/min (by C-G formula based on Cr of 0.72).    Assessment: 55 y.o. 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 INR was supratherapeutic on admission at 5.86. S/p vitamin K 5 mg SQ x1 on 5/9. Pharmacy consulted to dose warfarin.   Home warfarin dosage noted as 7.5 mg daily per anticoagulation clinic.  Today, 03/29/2016: -  INR down to within therapeutic range at 2.54 (s/p vit K 5 mg SQ on 5/9) -  hgb down slightly to 11.4, plts ok - no bleeding documented - no significant drug-drug intxns - on carb modified diet: eating ~25-45 % of meals  Goal of Therapy:  INR 2.5-3.5  (per outpatient anticoag. clinic)   Plan:  - with supra- therapeutic INR on admission, will resume warfarin back conservatively at 5 mg PO x1 today - Check daily PT/INR - Monitor for s/sx bleeding  Lisanne Ponce P 03/29/2016,2:40 PM

## 2016-03-29 NOTE — Progress Notes (Signed)
LCSW signing off of case at this time as there are no psychiatric recommendations at this time. Patient has competency at this time to make informed decisions regarding her medical and living situation. Patient refusing SNF or higher level of care at this time.  Plan: DC back home, resume [facility] services and aide services within the community setting. CSW with [facility] is aware of plan.  No other needs. Signing off.  Deretha Emory, MSW Clinical Social Work: System [employer] [phone]

## 2016-03-29 NOTE — Patient Outreach (Signed)
This RNCM received call from Orin, [facility], to inform me her medical director had reviewed patent's case and determined patient would not benefit from the program.  Plan:  Await patient' discharge from the acute care setting for transition of care.

## 2016-03-30 ENCOUNTER — Ambulatory Visit: Payer: Self-pay | Admitting: *Deleted

## 2016-03-30 LAB — GLUCOSE, CAPILLARY
GLUCOSE-CAPILLARY: 138 mg/dL — AB (ref 65–99)
GLUCOSE-CAPILLARY: 126 mg/dL — AB (ref 65–99)
GLUCOSE-CAPILLARY: 142 mg/dL — AB (ref 65–99)
Glucose-Capillary: 277 mg/dL — ABNORMAL HIGH (ref 65–99)

## 2016-03-30 LAB — PROTIME-INR
INR: 1.78 — AB (ref 0.00–1.49)
Prothrombin Time: 20.6 seconds — ABNORMAL HIGH (ref 11.6–15.2)

## 2016-03-30 LAB — HEPARIN LEVEL (UNFRACTIONATED)
HEPARIN UNFRACTIONATED: 0.39 [IU]/mL (ref 0.30–0.70)
Heparin Unfractionated: 0.37 IU/mL (ref 0.30–0.70)

## 2016-03-30 MED ORDER — WARFARIN SODIUM 7.5 MG PO TABS
7.5000 mg | ORAL_TABLET | Freq: Once | ORAL | Status: AC
  Administered 2016-03-30: 7.5 mg via ORAL
  Filled 2016-03-30: qty 1

## 2016-03-30 MED ORDER — HEPARIN (PORCINE) IN NACL 100-0.45 UNIT/ML-% IJ SOLN
800.0000 [IU]/h | INTRAMUSCULAR | Status: DC
  Administered 2016-03-30 – 2016-03-31 (×2): 800 [IU]/h via INTRAVENOUS
  Filled 2016-03-30 (×3): qty 250

## 2016-03-30 NOTE — Progress Notes (Signed)
ANTICOAGULATION CONSULT NOTE - Follow Up Consult  Pharmacy Consult for warfarin/heparin Indication: aortic valve replacement  Allergies  Allergen Reactions  . Adhesive [Tape] Other (See Comments)    Can burn skin if left on too long  . Celebrex [Celecoxib] Rash  . Detrol [Tolterodine] Hives    Patient Measurements: Height: 5' (152.4 cm) Weight: 125 lb 8 oz (56.926 kg) IBW/kg (Calculated) : 45.5   Vital Signs: Temp: 98.1 F (36.7 C) (05/12 0547) Temp Source: Oral (05/12 0547) BP: 113/66 mmHg (05/12 0547) Pulse Rate: 74 (05/12 0547)  Labs:  Recent Labs  03/27/16 1458 03/28/16 0713 03/29/16 0541 03/30/16 0529  HGB  --  11.4*  --   --   HCT  --  35.7*  --   --   PLT  --  345  --   --   LABPROT  --  50.3* 27.0* 20.6*  INR  --  5.81* 2.54* 1.78*  CREATININE 0.90 0.98 0.72  --     Estimated Creatinine Clearance: 62.8 mL/min (by C-G formula based on Cr of 0.72).    Assessment: 55 y.o. 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 INR was supratherapeutic on admission at 5.86. S/p vitamin K 5 mg SQ x1 on 5/9. Pharmacy consulted to dose warfarin.   Home warfarin dosage noted as 7.5 mg daily per anticoagulation clinic.  Today, 03/30/2016: -  INR now SUBtherapeutic = 1.78 (s/p vit K 5 mg SQ on 5/9), pharmacy asked to start heparin bridge. Warfarin resumed 5/11 following supratherapeutic INR since 5/6 - CBC: no CBC this am, prev labs OK - no bleeding documented - no significant drug-drug intxns - on carb modified diet  Goal of Therapy:  INR 2.5-3.5 (per outpatient anticoag. Clinic) Heparin level 0.3-0.7 Monitor platelets per protocol   Plan:  - start heparin gtt (no bolus) at 800  units/hr - warfarin 7.5mg  PO x 1 for subtherapeutic INR - Heparin level in 6h - Check daily PT/INR, heparin level, CBC - Monitor for s/sx bleeding  Juliette Alcide, PharmD, BCPS.   Pager: [phone] 03/30/2016 8:37 AM

## 2016-03-30 NOTE — Clinical Social Work Placement (Signed)
CSW received consult from [facility], Rosalita Chessman that patient is now agreeable with plan for SNF. CSW spoke with patient to confirm that she is still agreeable - CSW sent information out to [facility] SNFs - awaiting bed offers & PT evaluation note to submit to [employer] for [facility] authorization.     Lincoln Maxin, LCSW [facility] Clinical Social Worker cell #: [phone]    CLINICAL SOCIAL WORK PLACEMENT  NOTE  Date:  03/30/2016  Patient Details  Name: Madison [name] MRN: [id number] Date of Birth: [birth year]  Clinical Social Work is seeking post-discharge placement for this patient at the Skilled  Nursing Facility level of care (*CSW will initial, date and re-position this form in  chart as items are completed):  Yes   Patient/family provided with [place] Clinical Social Work Department's list of facilities offering this level of care within the geographic area requested by the patient (or if unable, by the patient's family).  Yes   Patient/family informed of their freedom to choose among providers that offer the needed level of care, that participate in Medicare, Medicaid or managed care program needed by the patient, have an available bed and are willing to accept the patient.  Yes   Patient/family informed of [facility]'s ownership interest in [facility] and [facility], as well as of the fact that they are under no obligation to receive care at these facilities.  PASRR submitted to EDS on       PASRR number received on       Existing PASRR number confirmed on 03/30/16     FL2 transmitted to all facilities in geographic area requested by pt/family on 03/30/16     FL2 transmitted to all facilities within larger geographic area on       Patient informed that his/her managed care company has contracts with or will negotiate with certain facilities, including the following:            Patient/family informed of bed offers  received.  Patient chooses bed at       Physician recommends and patient chooses bed at      Patient to be transferred to   on  .  Patient to be transferred to facility by       Patient family notified on   of transfer.  Name of family member notified:        PHYSICIAN       Additional Comment:    _______________________________________________ Arlyss Repress, LCSW 03/30/2016, 12:02 PM

## 2016-03-30 NOTE — Progress Notes (Signed)
ANTICOAGULATION CONSULT NOTE - Follow Up Consult  Pharmacy Consult for heparin Indication: aortic valve replacement  Please see pharmacist note from earlier today for further detail. First heparin level obtained tonight is therapeutic at 0.37 (goal heparin level 0.3-0.7). No complications reported.  Plan: Continue heparin infusion at current rate of 800 units/hr. Check heparin level in 6 hours to confirm rate.

## 2016-03-30 NOTE — Care Management Note (Signed)
Case Management Note  Patient Details  Name: Madison [name] MRN: [id number] Date of Birth: [birth year]  Subjective/Objective:   Admitted with DKA                 Action/Plan: Discharge planning, spoke with patient at bedside. Just worked with PT, recommendations for SNF, patient agreeable. CSW consulted for auth.   Expected Discharge Date:   (UNKNOWN)               Expected Discharge Plan:  Home w Home Health Services  In-House Referral:  Clinical Social Work  Discharge planning Services  CM Consult  Post Acute Care Choice:  Home Health Choice offered to:  Patient  DME Arranged:  N/A DME Agency:  NA  HH Arranged:  RN, Disease Management, PT HH Agency:  [facility]  Status of Service:  Completed, signed off  Medicare Important Message Given:  Yes Date Medicare IM Given:    Medicare IM give by:    Date Additional Medicare IM Given:    Additional Medicare Important Message give by:     If discussed at Long Length of Stay Meetings, dates discussed:    Additional Comments:  Alexis Goodell, RN 03/30/2016, 11:16 AM

## 2016-03-30 NOTE — Evaluation (Signed)
Physical Therapy Evaluation Patient Details Name: Madison [name] MRN: [id number] DOB: 1961-08-11 Today's Date: 03/30/2016   History of Present Illness  Pt adm 5/9 with N&V and hypoglycemia.  Pt admitted 4/9 with DK and , just in [facility] for same, DC'd 3/28. PMH - Difficult to control DKA, Rt CVA, CABG, L ankle fx 8/16, CAD, MI, RA and CHF  Clinical Impression  Pt admitted as above and presenting with functional mobility limitations 2* balance deficits, residual affects of prior CVAs and questionable safety awareness.  Pt could benefit from follow up rehab at SNF level to maximize IND and safety prior to return home with ltd assist.      Follow Up Recommendations SNF;Home health PT    Equipment Recommendations  None recommended by PT    Recommendations for Other Services OT consult     Precautions / Restrictions Precautions Precautions: Fall Restrictions Weight Bearing Restrictions: No      Mobility  Bed Mobility Overal bed mobility: Modified Independent             General bed mobility comments: Pt to EOB unassisted  Transfers Overall transfer level: Needs assistance Equipment used: None Transfers: Sit to/from Stand Sit to Stand: Min guard         General transfer comment: Cues for safe transition position and min guard to steady in initial standing  Ambulation/Gait Ambulation/Gait assistance: Min guard Ambulation Distance (Feet): 150 Feet (twice) Assistive device: None Gait Pattern/deviations: Step-through pattern;Decreased stance time - left;Shuffle;Decreased step length - right;Wide base of support Gait velocity: decr Gait velocity interpretation: Below normal speed for age/gender General Gait Details: Pt with head rotated to R but able to attend to L with min cues.  Mild general instability but no loss of balance noted including stepping bkwd and sideways.  Stairs            Wheelchair Mobility    Modified Rankin (Stroke Patients Only)        Balance Overall balance assessment: Needs assistance Sitting-balance support: No upper extremity supported;Feet supported Sitting balance-Leahy Scale: Good     Standing balance support: No upper extremity supported Standing balance-Leahy Scale: Fair                               Pertinent Vitals/Pain      Home Living Family/patient expects to be discharged to:: Unsure Living Arrangements: Other (Comment)               Additional Comments: Pt has roomate who works during the day but has an Aide M - Sat for 3 hrs a day    Prior Function Level of Independence: Independent         Comments: Reports she has an aide assist with IADLs, driving to appts etc.  reports that she prepares all of her meals, 9 YO son is staying with sister     Hand Dominance   Dominant Hand: Right    Extremity/Trunk Assessment   Upper Extremity Assessment: LUE deficits/detail       LUE Deficits / Details: Pt with ltd use of L UE 2* CVAs   Lower Extremity Assessment: LLE deficits/detail   LLE Deficits / Details: Strength 4/5     Communication   Communication: No difficulties  Cognition Arousal/Alertness: Awake/alert Behavior During Therapy: WFL for tasks assessed/performed Overall Cognitive Status: Within Functional Limits for tasks assessed  General Comments      Exercises        Assessment/Plan    PT Assessment Patient needs continued PT services  PT Diagnosis Difficulty walking   PT Problem List Decreased balance;Decreased mobility;Decreased safety awareness;Decreased strength;Decreased activity tolerance  PT Treatment Interventions DME instruction;Gait training;Stair training;Functional mobility training;Therapeutic activities;Therapeutic exercise;Balance training;Patient/family education   PT Goals (Current goals can be found in the Care Plan section) Acute Rehab PT Goals Patient Stated Goal: Regain IND PT Goal Formulation:  With patient Time For Goal Achievement: 04/06/16 Potential to Achieve Goals: Good    Frequency Min 3X/week   Barriers to discharge        Co-evaluation               End of Session Equipment Utilized During Treatment: Gait belt Activity Tolerance: Patient tolerated treatment well Patient left: in chair;with call bell/phone within reach;with chair alarm set Nurse Communication: Mobility status         Time: 1040-1111 PT Time Calculation (min) (ACUTE ONLY): 31 min   Charges:   PT Evaluation $PT Eval Low Complexity: 1 Procedure PT Treatments $Gait Training: 8-22 mins   PT G Codes:        Madison [name] Apr 14, 2016, 12:12 PM

## 2016-03-30 NOTE — [place] FL2 (Signed)
[place] MEDICAID FL2 LEVEL OF CARE SCREENING TOOL     IDENTIFICATION  Patient Name: Madison [name] Birthdate: Feb 13, 1961 Sex: female Admission Date (Current Location): 03/26/2016  [facility] and [place] Number:  Producer, television/film/video and Address:  [facility],  [address], [place] [address]      Provider Number: [id number]  Attending Physician Name and Address:  Alba Cory, MD  Relative Name and Phone Number:       Current Level of Care: Hospital Recommended Level of Care: Skilled Nursing Facility Prior Approval Number:    Date Approved/Denied:   PASRR Number: [id number]  Discharge Plan: Home    Current Diagnoses: Patient Active Problem List   Diagnosis Date Noted  . Type I diabetes mellitus with complication, uncontrolled (HCC) 03/29/2016  . Adjustment disorder with mixed anxiety and depressed mood 03/28/2016  . HLD (hyperlipidemia) 03/27/2016  . AKI (acute kidney injury) (HCC) 03/27/2016  . Chronic combined systolic and diastolic congestive heart failure (HCC)   . Diabetic ketoacidosis (HCC) 03/26/2016  . Type 1 diabetes, HbA1c goal < 7% (HCC)   . Hypotension 03/08/2016  . Uncontrolled type 1 diabetes mellitus with ketoacidotic coma (HCC)   . Acute encephalopathy 02/28/2016  . Hyponatremia: Pseudo 02/28/2016  . Lactic acidosis   . Diabetic ketoacidosis with coma associated with type 1 diabetes mellitus (HCC)   . Hypoglycemia   . Chronic systolic heart failure (HCC)   . Esophageal reflux   . S/P AVR (aortic valve replacement)   . ARF (acute renal failure) (HCC) 01/22/2016  . CHF (congestive heart failure) (HCC) 01/22/2016  . Diabetic ketoacidosis without coma associated with type 1 diabetes mellitus (HCC)   . DKA, type 1 (HCC) 11/23/2015  . Constipation 11/23/2015  . Leukocytosis   . Pressure ulcer 08/02/2015  . DKA (diabetic ketoacidoses) (HCC) 07/30/2015  . Diabetic ketoacidosis with coma associated with other specified diabetes  mellitus (HCC)   . Closed left ankle fracture 07/01/2015  . Trimalleolar fracture of left ankle 07/01/2015  . Hyperkalemia   . Insomnia 06/16/2015  . Normocytic anemia 01/31/2015  . Acute on chronic combined systolic and diastolic CHF (congestive heart failure) (HCC)   . Essential hypertension   . Elevated troponin   . Diabetes type 1, uncontrolled (HCC)   . Demand ischemia (HCC)   . S/P aortic valve replacement   . Hypokalemia   . NSTEMI (non-ST elevated myocardial infarction) (HCC) 12/08/2014  . Blood poisoning (HCC)   . Supratherapeutic INR   . Severe sepsis with acute organ dysfunction (HCC) 12/03/2014  . Spastic hemiplegia affecting nondominant side (HCC) 11/29/2014  . Dysphagia, pharyngoesophageal phase 10/04/2014  . Sinus tachycardia (HCC) 09/30/2014  . Depression 04/29/2014  . CVA (cerebral infarction) 03/12/2014  . Acute respiratory failure (HCC) 03/12/2014  . Chronic anticoagulation 07/28/2013  . Long term (current) use of anticoagulants 07/28/2013  . CAD (coronary artery disease)   . Aortic stenosis   . Hyperlipidemia   . Rheumatoid arthritis (HCC)     Orientation RESPIRATION BLADDER Height & Weight     Self, Situation, Time, Place  Normal Continent Weight: 125 lb 8 oz (56.926 kg) Height:  5' (152.4 cm)  BEHAVIORAL SYMPTOMS/MOOD NEUROLOGICAL BOWEL NUTRITION STATUS      Continent Diet (Carb Modified)  AMBULATORY STATUS COMMUNICATION OF NEEDS Skin   Extensive Assist Verbally Other (Comment) (PressureUlcer05/10/17DeepTissueInjury-Purpleormaroonlocalizedareaofdiscoloredintactskinorblood-filledblisterduetodamageofunderlyingsofttissuefrompressureand/orshear.brownareaonheelabout)                       Personal  Care Assistance Level of Assistance  Bathing, Dressing Bathing Assistance: Limited assistance   Dressing Assistance: Limited assistance     Functional Limitations Info             SPECIAL CARE FACTORS FREQUENCY   PT (By licensed PT), OT (By licensed OT)     PT Frequency: 5 OT Frequency: 5            Contractures      Additional Factors Info  Code Status, Allergies Code Status Info: Fullcode Allergies Info: Adhesive, Celebrex, Detrol           Current Medications (03/30/2016):  This is the current hospital active medication list Current Facility-Administered Medications  Medication Dose Route Frequency Provider Last Rate Last Dose  . albuterol (PROVENTIL) (2.5 MG/3ML) 0.083% nebulizer solution 2.5 mg  2.5 mg Nebulization Q2H PRN [name], MD      . citalopram (CELEXA) tablet 40 mg  40 mg Oral Daily [name], MD   40 mg at 03/30/16 0903  . dextrose 50 % solution 25 mL  25 mL Intravenous PRN [name], MD      . heparin ADULT infusion 100 units/mL (25000 units/250 mL)  800 Units/hr Intravenous Continuous Aleda Grana, RPH 8 mL/hr at 03/30/16 0858 800 Units/hr at 03/30/16 0858  . HYDROcodone-acetaminophen (NORCO/VICODIN) 5-325 MG per tablet 1 tablet  1 tablet Oral Q4H PRN [name], MD   1 tablet at 03/29/16 2205  . insulin aspart (novoLOG) injection 0-9 Units  0-9 Units Subcutaneous TID WC Belkys A Regalado, MD   1 Units at 03/30/16 0900  . insulin detemir (LEVEMIR) injection 15 Units  15 Units Subcutaneous QHS Belkys A Regalado, MD   15 Units at 03/29/16 2206  . ondansetron (ZOFRAN) tablet 4 mg  4 mg Oral Q6H PRN [name], MD       Or  . ondansetron (ZOFRAN) injection 4 mg  4 mg Intravenous Q6H PRN [name], MD   4 mg at 03/26/16 2129  . pantoprazole (PROTONIX) EC tablet 40 mg  40 mg Oral BID Belkys A Regalado, MD   40 mg at 03/30/16 0903  . phenol (CHLORASEPTIC) mouth spray 1 spray  1 spray Mouth/Throat PRN Hollice Espy, MD   1 spray at 03/28/16 1426  . pravastatin (PRAVACHOL) tablet 20 mg  20 mg Oral Daily [name], MD   20 mg at 03/30/16 0903  . sodium chloride flush (NS) 0.9 % injection 3 mL  3 mL Intravenous Q12H Rondell Burtis Junes,  MD   3 mL at 03/30/16 1005  . traZODone (DESYREL) tablet 50 mg  50 mg Oral QHS [name], MD   50 mg at 03/29/16 2205  . warfarin (COUMADIN) tablet 7.5 mg  7.5 mg Oral ONCE-1800 Aleda Grana, RPH      . Warfarin - Pharmacist Dosing Inpatient   Does not apply q1800 Lucia Gaskins, RPH   0  at 03/29/16 1445     Discharge Medications: Please see discharge summary for a list of discharge medications.  Relevant Imaging Results:  Relevant Lab Results:   Additional Information SSN: [id number]  Arlyss Repress, LCSW

## 2016-03-30 NOTE — Progress Notes (Signed)
ANTICOAGULATION CONSULT NOTE - Follow Up Consult  Pharmacy Consult for heparin Indication: aortic valve replacement  Please see pharmacist note from earlier today for further detail.  First heparin level obtained tonight is therapeutic at 0.39 (goal heparin level 0.3-0.7).  No complications reported.  Plan: Continue heparin infusion at current rate of 800 units/hr. Check heparin level in 6 hours to confirm rate.  Clance Boll, PharmD, BCPS Pager: [phone] 03/30/2016 5:10 PM

## 2016-03-30 NOTE — Progress Notes (Signed)
PROGRESS NOTE  Madison [name]:[id number] DOB: [birth year] DOA: 03/26/2016 PCP: [name], NP  HPI/Recap of past 24 hours: Patient is a 55 year old female with past medical history of chronic systolic/diastolic heart failure, previous CVA, chronic anticoagulation and poorly controlled diabetes mellitus type 1 on insulin who is had numerous admissions over the past few months for DKA. Patient lives at home with roommates. There is some question as to whether or not she is probably taking her medications.  Patient was admitted on 5/9 with complaints of nausea and vomiting and found to be in DKA again. She was placed in stepdown unit and started on IV fluids plus insulin drip and by the following morning, CBGs better although anion gap took a little longer to resolve.  Patient was evaluated by Psych 5-10 and was found to have capacity.  Patient feeling well this morning. She now agree to go to SNF for rehab.   Assessment/Plan: DKA (diabetic ketoacidoses) (HCC): A recurrent problem.  Off insulin Gtt.  Resolved.    Diabetes; brittle Diabetic.  Continue with  levemir to  15 CBG better this am.. No hypoglycemia.   Chronic anticoagulation, mechanical aortic valve;  Supratherapeutic INR: Unclear etiology. continue with  coumadin.  INR decrease to 1.7. Patient received vitamin K when INR was at 8.  Start heparin per pharmacy.   Chronic systolic/diastolic heart failure: Currently. Actually euvolemic. Will monitor closely for volume overload   Essential hypertension: Blood pressure stable  Insomnia  AKI (acute kidney injury) (HCC): Secondary to DKA and hypovolemia. Resolved with IV fluids  Code Status: Full code   Family Communication: none at bedside.    Disposition Plan: SNF when INR at goal.    Consultants:  Psychiatry   Procedures:  None   Antimicrobials:  None   DVT prophylaxis:  Lovenox   Objective: Filed Vitals:   03/29/16 1506 03/29/16 2200 03/30/16 0449  03/30/16 0547  BP: 119/55 123/60  113/66  Pulse: 91 86  74  Temp: 98.9 F (37.2 C) 98.3 F (36.8 C)  98.1 F (36.7 C)  TempSrc: Oral Oral  Oral  Resp: 22 18  18   Height:      Weight:   56.926 kg (125 lb 8 oz)   SpO2: 98% 98%  100%    Intake/Output Summary (Last 24 hours) at 03/30/16 1344 Last data filed at 03/30/16 1005  Gross per 24 hour  Intake    243 ml  Output      0 ml  Net    243 ml   Filed Weights   03/28/16 1452 03/29/16 0444 03/30/16 0449  Weight: 56.5 kg (124 lb 9 oz) 56.473 kg (124 lb 8 oz) 56.926 kg (125 lb 8 oz)    Exam:   General:  Alert and oriented 3, no acute distress   Cardiovascular: Regular rate and rhythm, S1-S2, borderline tachycardia   Respiratory: Clear to Auscultation bilaterally   Abdomen: Soft, nontender, nondistended, hypoactive bowel sounds   Musculoskeletal: No clubbing or cyanosis or edema   Skin: No skin breaks, tears or lesions  Psychiatry: Patient seems appropriate   Data Reviewed: CBC:  Recent Labs Lab 03/24/16 0139 03/26/16 1334 03/27/16 0339 03/28/16 0713  WBC 16.0* 9.5 11.7* 9.9  HGB 10.5* 12.0 12.7 11.4*  HCT 32.1* 38.5 38.3 35.7*  MCV 83.6 88.7 83.8 86.2  PLT 358 439* 241 345   Basic Metabolic Panel:  Recent Labs Lab 03/27/16 0339 03/27/16 0830 03/27/16 1458 03/28/16 0713 03/29/16 0541  NA  137 135 132* 134* 133*  K 4.4 3.9 3.6 3.2* 4.4  CL 106 108 101 100* 100*  CO2 13* 15* 21* 20* 23  GLUCOSE 190* 119* 165* 153* 377*  BUN 13 11 9 8 11   CREATININE 1.01* 0.90 0.90 0.98 0.72  CALCIUM 8.9 8.6* 8.4* 8.8* 9.1   GFR: Estimated Creatinine Clearance: 62.8 mL/min (by C-G formula based on Cr of 0.72). Liver Function Tests:  Recent Labs Lab 03/24/16 0139 03/26/16 1334  AST 17 27  ALT 16 22  ALKPHOS 136* 172*  BILITOT 1.3* 1.9*  PROT 6.8 7.8  ALBUMIN 3.7 4.1    Recent Labs Lab 03/24/16 0139 03/26/16 1948  LIPASE 48 19   No results for input(s): AMMONIA in the last 168 hours. Coagulation  Profile:  Recent Labs Lab 03/26/16 1334 03/27/16 0339 03/28/16 0713 03/29/16 0541 03/30/16 0529  INR 5.85* 8.16* 5.81* 2.54* 1.78*   Cardiac Enzymes:  Recent Labs Lab 03/27/16 0017 03/27/16 0625  TROPONINI <0.03 <0.03   BNP (last 3 results) No results for input(s): PROBNP in the last 8760 hours. HbA1C: No results for input(s): HGBA1C in the last 72 hours. CBG:  Recent Labs Lab 03/29/16 1241 03/29/16 1731 03/29/16 2200 03/30/16 0855 03/30/16 1130  GLUCAP 64* 242* 295* 138* 126*   Lipid Profile: No results for input(s): CHOL, HDL, LDLCALC, TRIG, CHOLHDL, LDLDIRECT in the last 72 hours. Thyroid Function Tests: No results for input(s): TSH, T4TOTAL, FREET4, T3FREE, THYROIDAB in the last 72 hours. Anemia Panel: No results for input(s): VITAMINB12, FOLATE, FERRITIN, TIBC, IRON, RETICCTPCT in the last 72 hours. Urine analysis:    Component Value Date/Time   COLORURINE YELLOW 03/26/2016 1253   APPEARANCEUR CLEAR 03/26/2016 1253   LABSPEC 1.026 03/26/2016 1253   PHURINE 5.0 03/26/2016 1253   GLUCOSEU >1000* 03/26/2016 1253   HGBUR NEGATIVE 03/26/2016 1253   BILIRUBINUR NEGATIVE 03/26/2016 1253   BILIRUBINUR n 06/16/2015 1244   KETONESUR >80* 03/26/2016 1253   PROTEINUR NEGATIVE 03/26/2016 1253   PROTEINUR n 06/16/2015 1244   UROBILINOGEN 0.2 07/30/2015 1415   UROBILINOGEN 0.2 06/16/2015 1244   NITRITE NEGATIVE 03/26/2016 1253   NITRITE n 06/16/2015 1244   LEUKOCYTESUR NEGATIVE 03/26/2016 1253   Sepsis Labs: @LABRCNTIP (procalcitonin:4,lacticidven:4)  ) Recent Results (from the past 240 hour(s))  MRSA PCR Screening     Status: None   Collection Time: 03/27/16  3:49 AM  Result Value Ref Range Status   MRSA by PCR NEGATIVE NEGATIVE Final    Comment:        The GeneXpert MRSA Assay (FDA approved for NASAL specimens only), is one component of a comprehensive MRSA colonization surveillance program. It is not intended to diagnose MRSA infection nor to guide  or monitor treatment for MRSA infections.       Studies: No results found.  Scheduled Meds: . citalopram  40 mg Oral Daily  . insulin aspart  0-9 Units Subcutaneous TID WC  . insulin detemir  15 Units Subcutaneous QHS  . pantoprazole  40 mg Oral BID  . pravastatin  20 mg Oral Daily  . sodium chloride flush  3 mL Intravenous Q12H  . traZODone  50 mg Oral QHS  . warfarin  7.5 mg Oral ONCE-1800  . Warfarin - Pharmacist Dosing Inpatient   Does not apply q1800    Continuous Infusions: . heparin 800 Units/hr (03/30/16 0858)     LOS: 4 days   Time spent: 25 minutes  05/27/16, MD [facility] Pager [phone]  If 7PM-7AM, please contact night-coverage [email or web address] Password [email or web address] 03/30/2016, 1:44 PM

## 2016-03-31 LAB — CBC
HCT: 33.2 % — ABNORMAL LOW (ref 36.0–46.0)
HEMOGLOBIN: 10.8 g/dL — AB (ref 12.0–15.0)
MCH: 28.2 pg (ref 26.0–34.0)
MCHC: 32.5 g/dL (ref 30.0–36.0)
MCV: 86.7 fL (ref 78.0–100.0)
PLATELETS: 309 10*3/uL (ref 150–400)
RBC: 3.83 MIL/uL — AB (ref 3.87–5.11)
RDW: 19.9 % — ABNORMAL HIGH (ref 11.5–15.5)
WBC: 6.3 10*3/uL (ref 4.0–10.5)

## 2016-03-31 LAB — GLUCOSE, CAPILLARY
GLUCOSE-CAPILLARY: 235 mg/dL — AB (ref 65–99)
GLUCOSE-CAPILLARY: 239 mg/dL — AB (ref 65–99)
GLUCOSE-CAPILLARY: 443 mg/dL — AB (ref 65–99)
Glucose-Capillary: 124 mg/dL — ABNORMAL HIGH (ref 65–99)

## 2016-03-31 LAB — HEPARIN LEVEL (UNFRACTIONATED): HEPARIN UNFRACTIONATED: 0.53 [IU]/mL (ref 0.30–0.70)

## 2016-03-31 LAB — PROTIME-INR
INR: 2.13 — ABNORMAL HIGH (ref 0.00–1.49)
PROTHROMBIN TIME: 23.7 s — AB (ref 11.6–15.2)

## 2016-03-31 MED ORDER — WARFARIN SODIUM 7.5 MG PO TABS
7.5000 mg | ORAL_TABLET | Freq: Once | ORAL | Status: AC
  Administered 2016-03-31: 7.5 mg via ORAL
  Filled 2016-03-31: qty 1

## 2016-03-31 MED ORDER — SUCRALFATE 1 GM/10ML PO SUSP
1.0000 g | Freq: Three times a day (TID) | ORAL | Status: DC
  Administered 2016-03-31 – 2016-04-03 (×12): 1 g via ORAL
  Filled 2016-03-31 (×20): qty 10

## 2016-03-31 NOTE — Progress Notes (Signed)
NP on call K. Schorr, notified of pt's 443 CBG and urinating 1400 cc at one time. Will continue to monitor pt closely. [name] I

## 2016-03-31 NOTE — Progress Notes (Signed)
ANTICOAGULATION CONSULT NOTE - Follow Up Consult  Pharmacy Consult for warfarin/heparin Indication: aortic valve replacement  Allergies  Allergen Reactions  . Adhesive [Tape] Other (See Comments)    Can burn skin if left on too long  . Celebrex [Celecoxib] Rash  . Detrol [Tolterodine] Hives    Patient Measurements: Height: 5' (152.4 cm) Weight: 131 lb 1.6 oz (59.467 kg) IBW/kg (Calculated) : 45.5   Vital Signs: Temp: 98.1 F (36.7 C) (05/13 0521) Temp Source: Oral (05/13 0521) BP: 134/59 mmHg (05/13 0521) Pulse Rate: 72 (05/13 0521)  Labs:  Recent Labs  03/29/16 0541 03/30/16 0529 03/30/16 1611 03/30/16 2209 03/31/16 0514  HGB  --   --   --   --  10.8*  HCT  --   --   --   --  33.2*  PLT  --   --   --   --  309  LABPROT 27.0* 20.6*  --   --  23.7*  INR 2.54* 1.78*  --   --  2.13*  HEPARINUNFRC  --   --  0.39 0.37 0.53  CREATININE 0.72  --   --   --   --     Estimated Creatinine Clearance: 64.1 mL/min (by C-G formula based on Cr of 0.72).    Assessment: 55 y.o. 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 INR was supratherapeutic on admission at 5.86. S/p vitamin K 5 mg SQ x1 on 5/9. Pharmacy consulted to dose warfarin.   Home warfarin dosage noted as 7.5 mg daily per anticoagulation clinic.  Today, 03/31/2016: -  INR now SUBtherapeutic = 2.13 but rising (s/p vit K 5 mg SQ on 5/9) after doses of 5mg  and 7.5mg  warfarin while inpatient - Heparin level therapeutic on current rate of 800 units/hr - CBC OK - no bleeding documented - no significant drug-drug intxns - on carb modified diet  Goal of Therapy:  INR 2.5-3.5 (per outpatient anticoag. Clinic) Heparin level 0.3-0.7 Monitor  platelets per protocol   Plan:  - Continue IV heparin at current rate of 800 units/hr until INR therapeutic - Repeat warfarin 7.5mg  tonight - Check daily PT/INR, heparin level, CBC - Monitor for s/sx bleeding   , PharmD, BCPS Pager [phone] 03/31/2016 7:58 AM

## 2016-03-31 NOTE — Progress Notes (Signed)
PROGRESS NOTE  Madison [name]:[id number] DOB: [birth year] DOA: 03/26/2016 PCP: [name], NP  HPI/Recap of past 24 hours: Patient is a 55 year old female with past medical history of chronic systolic/diastolic heart failure, previous CVA, chronic anticoagulation and poorly controlled diabetes mellitus type 1 on insulin who is had numerous admissions over the past few months for DKA. Patient lives at home with roommates. There is some question as to whether or not she is probably taking her medications.  Patient was admitted on 5/9 with complaints of nausea and vomiting and found to be in DKA again. She was placed in stepdown unit and started on IV fluids plus insulin drip and by the following morning, CBGs better although anion gap took a little longer to resolve.  Patient was evaluated by Psych 5-10 and was found to have capacity.  Patient INR sub-therapeutic, on heparin Gtt. Report odynophagia today. Will start Carafate.   Assessment/Plan: DKA (diabetic ketoacidoses) (HCC): A recurrent problem.  Off insulin Gtt.  Resolved.    Diabetes; brittle Diabetic.  Continue with  levemir to  15 CBG better this am.. No hypoglycemia.   Odynophagia;  Start Carafate.   Chronic anticoagulation, mechanical aortic valve;  Supratherapeutic INR: Unclear etiology. continue with  coumadin.  INR decrease to 1.7. Patient received vitamin K when INR was at 8.  Started heparin per pharmacy 5-12. INR today at 2.1. Goal 2.5 to 3.5.    Chronic systolic/diastolic heart failure: Currently. Actually euvolemic. Will monitor closely for volume overload   Essential hypertension: Blood pressure stable  Insomnia  AKI (acute kidney injury) (HCC): Secondary to DKA and hypovolemia. Resolved with IV fluids  Code Status: Full code   Family Communication: none at bedside.    Disposition Plan: SNF when INR at goal.    Consultants:  Psychiatry   Procedures:  None   Antimicrobials:  None   DVT  prophylaxis:  Lovenox   Objective: Filed Vitals:   03/30/16 1544 03/30/16 2133 03/31/16 0359 03/31/16 0521  BP: 107/53 116/54  134/59  Pulse: 89 82  72  Temp: 98.1 F (36.7 C) 98.6 F (37 C)  98.1 F (36.7 C)  TempSrc: Oral Oral  Oral  Resp: 20 20  20   Height:      Weight:   59.467 kg (131 lb 1.6 oz)   SpO2: 100% 98%  96%    Intake/Output Summary (Last 24 hours) at 03/31/16 1145 Last data filed at 03/30/16 1933  Gross per 24 hour  Intake    340 ml  Output      0 ml  Net    340 ml   Filed Weights   03/29/16 0444 03/30/16 0449 03/31/16 0359  Weight: 56.473 kg (124 lb 8 oz) 56.926 kg (125 lb 8 oz) 59.467 kg (131 lb 1.6 oz)    Exam:   General:  Alert and oriented 3, no acute distress   Cardiovascular: Regular rate and rhythm, S1-S2, borderline tachycardia   Respiratory: Clear to Auscultation bilaterally   Abdomen: Soft, nontender, nondistended, hypoactive bowel sounds   Musculoskeletal: No clubbing or cyanosis or edema   Skin: No skin breaks, tears or lesions  Psychiatry: Patient seems appropriate   Data Reviewed: CBC:  Recent Labs Lab 03/26/16 1334 03/27/16 0339 03/28/16 0713 03/31/16 0514  WBC 9.5 11.7* 9.9 6.3  HGB 12.0 12.7 11.4* 10.8*  HCT 38.5 38.3 35.7* 33.2*  MCV 88.7 83.8 86.2 86.7  PLT 439* 241 345 309   Basic Metabolic Panel:  Recent  Labs Lab 03/27/16 0339 03/27/16 0830 03/27/16 1458 03/28/16 0713 03/29/16 0541  NA 137 135 132* 134* 133*  K 4.4 3.9 3.6 3.2* 4.4  CL 106 108 101 100* 100*  CO2 13* 15* 21* 20* 23  GLUCOSE 190* 119* 165* 153* 377*  BUN 13 11 9 8 11   CREATININE 1.01* 0.90 0.90 0.98 0.72  CALCIUM 8.9 8.6* 8.4* 8.8* 9.1   GFR: Estimated Creatinine Clearance: 64.1 mL/min (by C-G formula based on Cr of 0.72). Liver Function Tests:  Recent Labs Lab 03/26/16 1334  AST 27  ALT 22  ALKPHOS 172*  BILITOT 1.9*  PROT 7.8  ALBUMIN 4.1    Recent Labs Lab 03/26/16 1948  LIPASE 19   No results for input(s):  AMMONIA in the last 168 hours. Coagulation Profile:  Recent Labs Lab 03/27/16 0339 03/28/16 0713 03/29/16 0541 03/30/16 0529 03/31/16 0514  INR 8.16* 5.81* 2.54* 1.78* 2.13*   Cardiac Enzymes:  Recent Labs Lab 03/27/16 0017 03/27/16 0625  TROPONINI <0.03 <0.03   BNP (last 3 results) No results for input(s): PROBNP in the last 8760 hours. HbA1C: No results for input(s): HGBA1C in the last 72 hours. CBG:  Recent Labs Lab 03/30/16 1130 03/30/16 1646 03/30/16 2141 03/31/16 0729 03/31/16 1128  GLUCAP 126* 142* 277* 124* 235*   Lipid Profile: No results for input(s): CHOL, HDL, LDLCALC, TRIG, CHOLHDL, LDLDIRECT in the last 72 hours. Thyroid Function Tests: No results for input(s): TSH, T4TOTAL, FREET4, T3FREE, THYROIDAB in the last 72 hours. Anemia Panel: No results for input(s): VITAMINB12, FOLATE, FERRITIN, TIBC, IRON, RETICCTPCT in the last 72 hours. Urine analysis:    Component Value Date/Time   COLORURINE YELLOW 03/26/2016 1253   APPEARANCEUR CLEAR 03/26/2016 1253   LABSPEC 1.026 03/26/2016 1253   PHURINE 5.0 03/26/2016 1253   GLUCOSEU >1000* 03/26/2016 1253   HGBUR NEGATIVE 03/26/2016 1253   BILIRUBINUR NEGATIVE 03/26/2016 1253   BILIRUBINUR n 06/16/2015 1244   KETONESUR >80* 03/26/2016 1253   PROTEINUR NEGATIVE 03/26/2016 1253   PROTEINUR n 06/16/2015 1244   UROBILINOGEN 0.2 07/30/2015 1415   UROBILINOGEN 0.2 06/16/2015 1244   NITRITE NEGATIVE 03/26/2016 1253   NITRITE n 06/16/2015 1244   LEUKOCYTESUR NEGATIVE 03/26/2016 1253   Sepsis Labs: @LABRCNTIP (procalcitonin:4,lacticidven:4)  ) Recent Results (from the past 240 hour(s))  MRSA PCR Screening     Status: None   Collection Time: 03/27/16  3:49 AM  Result Value Ref Range Status   MRSA by PCR NEGATIVE NEGATIVE Final    Comment:        The GeneXpert MRSA Assay (FDA approved for NASAL specimens only), is one component of a comprehensive MRSA colonization surveillance program. It is  not intended to diagnose MRSA infection nor to guide or monitor treatment for MRSA infections.       Studies: No results found.  Scheduled Meds: . citalopram  40 mg Oral Daily  . insulin aspart  0-9 Units Subcutaneous TID WC  . insulin detemir  15 Units Subcutaneous QHS  . pantoprazole  40 mg Oral BID  . pravastatin  20 mg Oral Daily  . sodium chloride flush  3 mL Intravenous Q12H  . sucralfate  1 g Oral TID WC & HS  . traZODone  50 mg Oral QHS  . warfarin  7.5 mg Oral ONCE-1800  . Warfarin - Pharmacist Dosing Inpatient   Does not apply q1800    Continuous Infusions: . heparin 800 Units/hr (03/30/16 0858)     LOS: 5 days  Time spent: 25 minutes  Alba Cory, MD [facility] Pager [phone]  If 7PM-7AM, please contact night-coverage [email or web address] Password [email or web address] 03/31/2016, 11:45 AM

## 2016-03-31 NOTE — Progress Notes (Signed)
CSW provided patient with a list of SNF bed offers received thus far- advised her we will likely get more options into the week. She is pleasant and continues to agree with plans for SNF/rehab. CSW offered encouragement and support. Reece Levy, MSW, LCSW  [phone]/weekend coverage

## 2016-04-01 DIAGNOSIS — E1065 Type 1 diabetes mellitus with hyperglycemia: Secondary | ICD-10-CM

## 2016-04-01 DIAGNOSIS — E108 Type 1 diabetes mellitus with unspecified complications: Secondary | ICD-10-CM

## 2016-04-01 LAB — PROTIME-INR
INR: 2.71 — ABNORMAL HIGH (ref 0.00–1.49)
Prothrombin Time: 28.4 seconds — ABNORMAL HIGH (ref 11.6–15.2)

## 2016-04-01 LAB — BASIC METABOLIC PANEL
Anion gap: 9 (ref 5–15)
BUN: 11 mg/dL (ref 6–20)
CO2: 24 mmol/L (ref 22–32)
Calcium: 8.3 mg/dL — ABNORMAL LOW (ref 8.9–10.3)
Chloride: 94 mmol/L — ABNORMAL LOW (ref 101–111)
Creatinine, Ser: 0.76 mg/dL (ref 0.44–1.00)
GFR calc Af Amer: >60 mL/min (ref >60)
GFR calc non Af Amer: >60 mL/min (ref >60)
Glucose, Bld: 485 mg/dL — ABNORMAL HIGH (ref 65–99)
Potassium: 4.7 mmol/L (ref 3.5–5.1)
Sodium: 127 mmol/L — ABNORMAL LOW (ref 135–145)

## 2016-04-01 LAB — CBC
HEMATOCRIT: 32.6 % — AB (ref 36.0–46.0)
HEMOGLOBIN: 10.5 g/dL — AB (ref 12.0–15.0)
MCH: 27.3 pg (ref 26.0–34.0)
MCHC: 32.2 g/dL (ref 30.0–36.0)
MCV: 84.7 fL (ref 78.0–100.0)
Platelets: 314 10*3/uL (ref 150–400)
RBC: 3.85 MIL/uL — ABNORMAL LOW (ref 3.87–5.11)
RDW: 19.7 % — AB (ref 11.5–15.5)
WBC: 5.8 10*3/uL (ref 4.0–10.5)

## 2016-04-01 LAB — HEPARIN LEVEL (UNFRACTIONATED): HEPARIN UNFRACTIONATED: 0.63 [IU]/mL (ref 0.30–0.70)

## 2016-04-01 LAB — GLUCOSE, CAPILLARY
GLUCOSE-CAPILLARY: 221 mg/dL — AB (ref 65–99)
GLUCOSE-CAPILLARY: 97 mg/dL (ref 65–99)
GLUCOSE-CAPILLARY: 60 mg/dL — AB (ref 65–99)
GLUCOSE-CAPILLARY: 55 mg/dL — AB (ref 65–99)
Glucose-Capillary: 165 mg/dL — ABNORMAL HIGH (ref 65–99)
Glucose-Capillary: 169 mg/dL — ABNORMAL HIGH (ref 65–99)
Glucose-Capillary: 209 mg/dL — ABNORMAL HIGH (ref 65–99)
Glucose-Capillary: 334 mg/dL — ABNORMAL HIGH (ref 65–99)

## 2016-04-01 MED ORDER — SODIUM CHLORIDE 0.9 % IV SOLN
INTRAVENOUS | Status: DC
  Administered 2016-04-01 – 2016-04-03 (×4): via INTRAVENOUS

## 2016-04-01 MED ORDER — INSULIN ASPART 100 UNIT/ML ~~LOC~~ SOLN
10.0000 [IU] | Freq: Once | SUBCUTANEOUS | Status: AC
  Administered 2016-04-01: 10 [IU] via SUBCUTANEOUS

## 2016-04-01 MED ORDER — SENNOSIDES-DOCUSATE SODIUM 8.6-50 MG PO TABS
1.0000 | ORAL_TABLET | Freq: Two times a day (BID) | ORAL | Status: DC
  Administered 2016-04-01 – 2016-04-03 (×5): 1 via ORAL
  Filled 2016-04-01 (×7): qty 1

## 2016-04-01 MED ORDER — POLYETHYLENE GLYCOL 3350 17 G PO PACK
17.0000 g | PACK | Freq: Two times a day (BID) | ORAL | Status: DC
  Administered 2016-04-01 – 2016-04-03 (×5): 17 g via ORAL
  Filled 2016-04-01 (×6): qty 1

## 2016-04-01 MED ORDER — WARFARIN SODIUM 7.5 MG PO TABS
7.5000 mg | ORAL_TABLET | Freq: Once | ORAL | Status: AC
  Administered 2016-04-01: 7.5 mg via ORAL
  Filled 2016-04-01: qty 1

## 2016-04-01 MED ORDER — SODIUM CHLORIDE 0.9 % IV BOLUS (SEPSIS)
500.0000 mL | Freq: Once | INTRAVENOUS | Status: AC
Start: 2016-04-01 — End: 2016-04-01
  Administered 2016-04-01: 500 mL via INTRAVENOUS

## 2016-04-01 NOTE — Progress Notes (Signed)
Hypoglycemic Event  CBG: 60  Treatment: 15 GM carbohydrate snack  Symptoms: None  Follow-up CBG: Time:0754 CBG Result:55  Possible Reasons for Event: Unknown  Comments/MD notified:Patient given another 15GM snack.  F/u CBG 165    Katina Degree Dionne

## 2016-04-01 NOTE — Progress Notes (Signed)
PROGRESS NOTE  Madison [name] KTG:[id number] DOB: 1961/03/17 DOA: 03/26/2016 PCP: [name], NP  HPI/Recap of past 24 hours: Patient is a 55 year old female with past medical history of chronic systolic/diastolic heart failure, previous CVA, chronic anticoagulation and poorly controlled diabetes mellitus type 1 on insulin who is had numerous admissions over the past few months for DKA. Patient lives at home with roommates. There is some question as to whether or not she is probably taking her medications.  Patient was admitted on 5/9 with complaints of nausea and vomiting and found to be in DKA again. She was placed in stepdown unit and started on IV fluids plus insulin drip and by the following morning, CBGs better although anion gap took a little longer to resolve.  Patient was evaluated by Psych 5-10 and was found to have capacity.  Patient INR sub-therapeutic, on heparin Gtt. Report odynophagia 5-13. Improved on Carafate.  Feeling sleepy this am and tired.  Carafate helping with odynophagia   Assessment/Plan: DKA (diabetic ketoacidoses) (HCC): A recurrent problem.  Off insulin Gtt.  Resolved.    Diabetes; brittle Diabetic.  Continue with  levemir to  15 Brittle diabetic.  Suspect hypoglycemia this am from short acting, but will observed overnight and decrease levemir.   Odynophagia;  Started Carafate 5-13. Improved.   Chronic anticoagulation, mechanical aortic valve;  Supratherapeutic INR: Unclear etiology. continue with  coumadin.  INR at goal 2.7.    Chronic systolic/diastolic heart failure: Currently. Actually euvolemic. Will monitor closely for volume overload   Essential hypertension: Blood pressure stable  Insomnia  AKI (acute kidney injury) (HCC): Secondary to DKA and hypovolemia. Resolved with IV fluids  Code Status: Full code   Family Communication: none at bedside.    Disposition Plan: SNF when INR at goal.    Consultants:  Psychiatry    Procedures:  None   Antimicrobials:  None   DVT prophylaxis:  Lovenox   Objective: Filed Vitals:   03/31/16 0521 03/31/16 1445 03/31/16 2202 04/01/16 0500  BP: 134/59 115/69 123/62 107/60  Pulse: 72 86 81 75  Temp: 98.1 F (36.7 C) 98.2 F (36.8 C) 97.8 F (36.6 C) 97.5 F (36.4 C)  TempSrc: Oral Oral Oral Oral  Resp: 20 18 20 18   Height:      Weight:    60.51 kg (133 lb 6.4 oz)  SpO2: 96% 100% 100% 100%    Intake/Output Summary (Last 24 hours) at 04/01/16 1039 Last data filed at 04/01/16 0805  Gross per 24 hour  Intake  881.6 ml  Output   2120 ml  Net -1238.4 ml   Filed Weights   03/30/16 0449 03/31/16 0359 04/01/16 0500  Weight: 56.926 kg (125 lb 8 oz) 59.467 kg (131 lb 1.6 oz) 60.51 kg (133 lb 6.4 oz)    Exam:   General:  Alert and oriented 3, no acute distress   Cardiovascular: Regular rate and rhythm, S1-S2, borderline tachycardia   Respiratory: Clear to Auscultation bilaterally   Abdomen: Soft, nontender, nondistended, hypoactive bowel sounds   Musculoskeletal: No clubbing or cyanosis or edema   Skin: No skin breaks, tears or lesions  Psychiatry: Patient seems appropriate   Data Reviewed: CBC:  Recent Labs Lab 03/26/16 1334 03/27/16 0339 03/28/16 0713 03/31/16 0514 04/01/16 0548  WBC 9.5 11.7* 9.9 6.3 5.8  HGB 12.0 12.7 11.4* 10.8* 10.5*  HCT 38.5 38.3 35.7* 33.2* 32.6*  MCV 88.7 83.8 86.2 86.7 84.7  PLT 439* 241 345 309 314   Basic  Metabolic Panel:  Recent Labs Lab 03/27/16 0830 03/27/16 1458 03/28/16 0713 03/29/16 0541 03/31/16 2346  NA 135 132* 134* 133* 127*  K 3.9 3.6 3.2* 4.4 4.7  CL 108 101 100* 100* 94*  CO2 15* 21* 20* 23 24  GLUCOSE 119* 165* 153* 377* 485*  BUN 11 9 8 11 11   CREATININE 0.90 0.90 0.98 0.72 0.76  CALCIUM 8.6* 8.4* 8.8* 9.1 8.3*   GFR: Estimated Creatinine Clearance: 64.6 mL/min (by C-G formula based on Cr of 0.76). Liver Function Tests:  Recent Labs Lab 03/26/16 1334  AST 27  ALT  22  ALKPHOS 172*  BILITOT 1.9*  PROT 7.8  ALBUMIN 4.1    Recent Labs Lab 03/26/16 1948  LIPASE 19   No results for input(s): AMMONIA in the last 168 hours. Coagulation Profile:  Recent Labs Lab 03/28/16 0713 03/29/16 0541 03/30/16 0529 03/31/16 0514 04/01/16 0548  INR 5.81* 2.54* 1.78* 2.13* 2.71*   Cardiac Enzymes:  Recent Labs Lab 03/27/16 0017 03/27/16 0625  TROPONINI <0.03 <0.03   BNP (last 3 results) No results for input(s): PROBNP in the last 8760 hours. HbA1C: No results for input(s): HGBA1C in the last 72 hours. CBG:  Recent Labs Lab 04/01/16 0253 04/01/16 0514 04/01/16 0728 04/01/16 0754 04/01/16 0836  GLUCAP 221* 97 60* 55* 165*   Lipid Profile: No results for input(s): CHOL, HDL, LDLCALC, TRIG, CHOLHDL, LDLDIRECT in the last 72 hours. Thyroid Function Tests: No results for input(s): TSH, T4TOTAL, FREET4, T3FREE, THYROIDAB in the last 72 hours. Anemia Panel: No results for input(s): VITAMINB12, FOLATE, FERRITIN, TIBC, IRON, RETICCTPCT in the last 72 hours. Urine analysis:    Component Value Date/Time   COLORURINE YELLOW 03/26/2016 1253   APPEARANCEUR CLEAR 03/26/2016 1253   LABSPEC 1.026 03/26/2016 1253   PHURINE 5.0 03/26/2016 1253   GLUCOSEU >1000* 03/26/2016 1253   HGBUR NEGATIVE 03/26/2016 1253   BILIRUBINUR NEGATIVE 03/26/2016 1253   BILIRUBINUR n 06/16/2015 1244   KETONESUR >80* 03/26/2016 1253   PROTEINUR NEGATIVE 03/26/2016 1253   PROTEINUR n 06/16/2015 1244   UROBILINOGEN 0.2 07/30/2015 1415   UROBILINOGEN 0.2 06/16/2015 1244   NITRITE NEGATIVE 03/26/2016 1253   NITRITE n 06/16/2015 1244   LEUKOCYTESUR NEGATIVE 03/26/2016 1253   Sepsis Labs: @LABRCNTIP (procalcitonin:4,lacticidven:4)  ) Recent Results (from the past 240 hour(s))  MRSA PCR Screening     Status: None   Collection Time: 03/27/16  3:49 AM  Result Value Ref Range Status   MRSA by PCR NEGATIVE NEGATIVE Final    Comment:        The GeneXpert MRSA Assay  (FDA approved for NASAL specimens only), is one component of a comprehensive MRSA colonization surveillance program. It is not intended to diagnose MRSA infection nor to guide or monitor treatment for MRSA infections.       Studies: No results found.  Scheduled Meds: . citalopram  40 mg Oral Daily  . insulin aspart  0-9 Units Subcutaneous TID WC  . insulin detemir  15 Units Subcutaneous QHS  . pantoprazole  40 mg Oral BID  . pravastatin  20 mg Oral Daily  . sodium chloride flush  3 mL Intravenous Q12H  . sucralfate  1 g Oral TID WC & HS  . traZODone  50 mg Oral QHS  . warfarin  7.5 mg Oral ONCE-1800  . Warfarin - Pharmacist Dosing Inpatient   Does not apply q1800    Continuous Infusions:     LOS: 6 days   Time spent:  25 minutes  Alba Cory, MD [facility] Pager [phone]  If 7PM-7AM, please contact night-coverage [email or web address] Password [facility] 04/01/2016, 10:39 AM

## 2016-04-01 NOTE — Progress Notes (Signed)
ANTICOAGULATION CONSULT NOTE - Follow Up Consult  Pharmacy Consult for warfarin Indication: aortic valve replacement  Allergies  Allergen Reactions  . Adhesive [Tape] Other (See Comments)    Can burn skin if left on too long  . Celebrex [Celecoxib] Rash  . Detrol [Tolterodine] Hives    Patient Measurements: Height: 5' (152.4 cm) Weight: 133 lb 6.4 oz (60.51 kg) IBW/kg (Calculated) : 45.5   Vital Signs: Temp: 97.5 F (36.4 C) (05/14 0500) Temp Source: Oral (05/14 0500) BP: 107/60 mmHg (05/14 0500) Pulse Rate: 75 (05/14 0500)  Labs:  Recent Labs  03/30/16 0529  03/30/16 2209 03/31/16 0514 03/31/16 2346 04/01/16 0548  HGB  --   --   --  10.8*  --  10.5*  HCT  --   --   --  33.2*  --  32.6*  PLT  --   --   --  309  --  314  LABPROT 20.6*  --   --  23.7*  --  28.4*  INR 1.78*  --   --  2.13*  --  2.71*  HEPARINUNFRC  --   < > 0.37 0.53  --  0.63  CREATININE  --   --   --   --  0.76  --   < > = values in this interval not displayed.  Estimated Creatinine Clearance: 64.6 mL/min (by C-G formula based on Cr of 0.76).    Assessment: 55 y.o. 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 INR was supratherapeutic on admission at 5.86. S/p vitamin K 5 mg SQ x1 on 5/9. Pharmacy consulted to dose warfarin.   Home warfarin dosage noted as 7.5 mg daily per anticoagulation clinic.  Today, 04/01/2016: -  INR 2.71, now therapeutic - Heparin level therapeutic, but now d/c on 5/14 AM - CBC: Hgb decreased but stable, Plt remain WNL. - no bleeding documented - no significant drug-drug intxns - on carb modified diet  Goal of Therapy:  INR 2.5-3.5 (per outpatient anticoag. Clinic) Heparin level  0.3-0.7 Monitor platelets per protocol   Plan:  - Repeat warfarin 7.5mg  PO tonight - Check daily PT/INR, CBC - Monitor for s/sx bleeding   Lynann Beaver PharmD, BCPS Pager [phone] 04/01/2016 9:53 AM

## 2016-04-02 DIAGNOSIS — R791 Abnormal coagulation profile: Secondary | ICD-10-CM

## 2016-04-02 DIAGNOSIS — I5042 Chronic combined systolic (congestive) and diastolic (congestive) heart failure: Secondary | ICD-10-CM

## 2016-04-02 LAB — GLUCOSE, CAPILLARY
GLUCOSE-CAPILLARY: 118 mg/dL — AB (ref 65–99)
GLUCOSE-CAPILLARY: 96 mg/dL (ref 65–99)
GLUCOSE-CAPILLARY: 295 mg/dL — AB (ref 65–99)
GLUCOSE-CAPILLARY: 355 mg/dL — AB (ref 65–99)

## 2016-04-02 LAB — BASIC METABOLIC PANEL
Anion gap: 6 (ref 5–15)
BUN: 6 mg/dL (ref 6–20)
CALCIUM: 8.8 mg/dL — AB (ref 8.9–10.3)
CHLORIDE: 105 mmol/L (ref 101–111)
CO2: 26 mmol/L (ref 22–32)
CREATININE: 0.58 mg/dL (ref 0.44–1.00)
GFR calc non Af Amer: >60 mL/min (ref >60)
Glucose, Bld: 184 mg/dL — ABNORMAL HIGH (ref 65–99)
Potassium: 4.1 mmol/L (ref 3.5–5.1)
SODIUM: 137 mmol/L (ref 135–145)

## 2016-04-02 LAB — CBC
HCT: 30.2 % — ABNORMAL LOW (ref 36.0–46.0)
HEMOGLOBIN: 10 g/dL — AB (ref 12.0–15.0)
MCH: 28.5 pg (ref 26.0–34.0)
MCHC: 33.1 g/dL (ref 30.0–36.0)
MCV: 86 fL (ref 78.0–100.0)
Platelets: 283 10*3/uL (ref 150–400)
RBC: 3.51 MIL/uL — ABNORMAL LOW (ref 3.87–5.11)
RDW: 19.8 % — AB (ref 11.5–15.5)
WBC: 4.8 10*3/uL (ref 4.0–10.5)

## 2016-04-02 LAB — PROTIME-INR
INR: 3.6 — AB (ref 0.00–1.49)
PROTHROMBIN TIME: 35.1 s — AB (ref 11.6–15.2)

## 2016-04-02 MED ORDER — INSULIN DETEMIR 100 UNIT/ML ~~LOC~~ SOLN: 15.0000 [IU] | Freq: Every day | SUBCUTANEOUS | Status: DC

## 2016-04-02 MED ORDER — WARFARIN SODIUM 4 MG PO TABS
4.0000 mg | ORAL_TABLET | Freq: Once | ORAL | Status: AC
  Administered 2016-04-02: 4 mg via ORAL
  Filled 2016-04-02: qty 1

## 2016-04-02 MED ORDER — SENNOSIDES-DOCUSATE SODIUM 8.6-50 MG PO TABS: 1.0000 | ORAL_TABLET | Freq: Two times a day (BID) | ORAL | Status: DC

## 2016-04-02 MED ORDER — INSULIN ASPART 100 UNIT/ML ~~LOC~~ SOLN: 2.0000 [IU] | Freq: Three times a day (TID) | SUBCUTANEOUS | Status: DC

## 2016-04-02 MED ORDER — SUCRALFATE 1 GM/10ML PO SUSP: 1.0000 g | Freq: Three times a day (TID) | ORAL | Status: DC

## 2016-04-02 MED ORDER — WARFARIN SODIUM 4 MG PO TABS: 4.0000 mg | ORAL_TABLET | Freq: Every day | ORAL | Status: DC

## 2016-04-02 MED ORDER — FLEET ENEMA 7-19 GM/118ML RE ENEM: 1.0000 | ENEMA | Freq: Once | RECTAL | Status: DC

## 2016-04-02 NOTE — Clinical Social Work Note (Signed)
Clinical Social Work Assessment  Patient Details  Name: Madison [name] MRN: [id number] Date of Birth: 1961/05/03  Date of referral:  04/02/16               Reason for consult:  Facility Placement (SNF-[facility])                Permission sought to share information with:  Case Manager Permission granted to share information::     Name::      Donnie Coffin  Agency::     Relationship::     Contact Information:     Housing/Transportation Living arrangements for the past 2 months:  Apartment Source of Information:  Patient Patient Interpreter Needed:  None Criminal Activity/Legal Involvement Pertinent to Current Situation/Hospitalization:  No - Comment as needed Significant Relationships:  Siblings Lives with:  Self Do you feel safe going back to the place where you live?  No Need for family participation in patient care:  Yes (Comment)  Care giving concerns: Pt. Is aware she needs a SNF placement. Patient has agreed to SNF-[facility] where she has gone in the past.   Facilities manager / plan: LCSWA met with patient at bedside. [place] educated client on SNF placements. LCSWA agreed to inform sister of DC to [place].   Plan-DC patient to [facility], waiting on Authorization.   Employment status:    Insurance information:  Medicare PT Recommendations:  [place] / Referral to community resources:     Patient/Family's Response to care: Agreeable  Patient/Family's Understanding of and Emotional Response to Diagnosis, Current Treatment, and Prognosis: Patient reports "I know I need a SNF, I am willing to go back to [place]". Patient reported that her sister would need to help with belongings and the transition to facility (signing of paper work, Social research officer, government).   Emotional Assessment Appearance:  Appears stated age Attitude/Demeanor/Rapport:   (Cooperativel, Pleasant) Affect (typically observed):  Pleasant,  Calm Orientation:  Oriented to Self, Oriented to Place, Oriented to  Time, Oriented to Situation Alcohol / Substance use:  Not Applicable Psych involvement (Current and /or in the community):  No (Comment)  Discharge Needs  Concerns to be addressed:  No discharge needs identified Readmission within the last 30 days:  Yes Current discharge risk:  None Barriers to Discharge:  No Barriers Identified   Lia Hopping, LCSW 04/02/2016, 11:40 AM

## 2016-04-02 NOTE — Progress Notes (Addendum)
LCSWA met with patient at bedside. Patient chose [facility], waiting on Authorization.   [facility] agreeable to accept patient if family arrives at facility before 3:30.  Tracey contacted: patient sister and she will sign patient into facility.  Awaiting auth from [employer]. All clinicals have been sent and DC planned for today. Patient going to DC by EMS.  Kathrin Greathouse, Latanya Presser, MSW Clinical Social Worker 5E and Psychiatric Service Line [phone] 04/02/2016  1:25 PM

## 2016-04-02 NOTE — Care Management Note (Signed)
Case Management Note  Patient Details  Name: AVEREE HARB MRN: [id number] Date of Birth: 1961-09-26  Subjective/Objective:  D/c plan to SNF-CSW awaiting auth-[facility].                  Action/Plan:d/c SNF.   Expected Discharge Date:   (UNKNOWN)               Expected Discharge Plan:  Skilled Nursing Facility  In-House Referral:  Clinical Social Work  Discharge planning Services  CM Consult  Post Acute Care Choice:  Home Health Choice offered to:  Patient  DME Arranged:  N/A DME Agency:  NA  HH Arranged:    HH Agency:  [facility]  Status of Service:  Completed, signed off  Medicare Important Message Given:  Yes Date Medicare IM Given:    Medicare IM give by:    Date Additional Medicare IM Given:    Additional Medicare Important Message give by:     If discussed at Long Length of Stay Meetings, dates discussed:    Additional Comments:  Lanier Clam, RN 04/02/2016, 2:27 PM

## 2016-04-02 NOTE — Progress Notes (Signed)
Patient will be going home with home health due to insurance sending patient for medical review as patient is walking 150 feet per PT notes.   Patient has active home health with [facility] and [facility] services. RN CM was contacted to assist with home health. Orders to be placed. LCSW spoke with attending at length regarding patient's current status. MD does not feel patient is stable for DC with INR, would be stable for DC if going to SNF. MD wants to hold patient to see if it stabilizes on medication. If so, then patient to DC on 5/16 to home. Insurance is currently under medical review and MD to review from [facility] for admission into SNF, thus currently no insurance authorization. Will follow up with [facility] and insurance tomorrow morning.    SNF: [facility] made aware of plan. RN made aware of plan as well as patient per RN.  Deretha Emory, MSW Clinical Social Work: [facility] [employer] [phone]

## 2016-04-02 NOTE — Discharge Summary (Signed)
Physician Discharge Summary  Madison [name]:[id number] DOB: Dec 05, 1960 DOA: 03/26/2016  PCP: Dorothyann Peng, NP  Admit date: 03/26/2016 Discharge date: 04/02/2016  Time spent: 35 minutes  Recommendations for Outpatient Follow-up:  Need INR level check 5-16 , further doses of coumadin depending on INR level.  Adjust diabetes medications as needed. Patient is brittle diabetic./  B-met to follow renal function   Discharge Diagnoses:    DKA (diabetic ketoacidoses) ([place])   AKI (acute kidney injury) ([place])   Adjustment disorder with mixed anxiety and depressed mood   Hyperlipidemia   Chronic anticoagulation   Supratherapeutic INR   Essential hypertension   Insomnia   Chronic combined systolic and diastolic congestive heart failure (HCC)   Type I diabetes mellitus with complication, uncontrolled ([place])   Discharge Condition: stable  Diet recommendation: Carb modified.   Filed Weights   03/31/16 0359 04/01/16 0500 04/02/16 0530  Weight: 59.467 kg (131 lb 1.6 oz) 60.51 kg (133 lb 6.4 oz) 58.786 kg (129 lb 9.6 oz)    History of present illness:  HPI: ERSEL WADLEIGH is a 55 y.o. 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 Patient notes the symptoms have been ongoing over the last 4-5 weeks and not improved. She is unable to keep any food or liquids down regularly. When asked about her diabetic regimen patient states that she takes her insulin as advised and that her sugars have been running in 90-180. Due to the repeated nausea and vomiting symptoms she reports lower abdominal pain and back pain. Associated symptoms also include urinary frequency and some shortness of breath. Denies any dysuria, chest pain, fever, chills, leg swelling, diarrhea, constipation. Patient was just evaluated 3 days ago for similar  complaints and hospitalized one week prior to that for diabetic ketoacidosis.  ED Course: Upon admission into the emergency department patient was found to be afebrile, heart rates up to 119, respirations up to 25, blood pressure as low as 90/51, and O2 saturations maintained. Initial lab work revealed a WBC 9.5, hemoglobin 12, platelets 439, sodium 127, potassium 4.7, chloride 90, CO2 9, BUN 17, creatinine 1.5, blood glucose of 673, glucose 673, anion gap 28. UA positive for acute sounds otherwise negative for any signs of bacteria. While in the emergency department she started on the glucose stabilized per protocol. Chest x-ray was clear of any signs of infection.  Hospital Course:  HPI/Recap of past 24 hours: Patient is a 55 year old female with past medical history of chronic systolic/diastolic heart failure, previous CVA, chronic anticoagulation and poorly controlled diabetes mellitus type 1 on insulin who is had numerous admissions over the past few months for DKA. Patient lives at home with roommates. There is some question as to whether or not she is probably taking her medications. Patient was admitted on 5/9 with complaints of nausea and vomiting and found to be in DKA again. She was placed in stepdown unit and started on IV fluids plus insulin drip and by the following morning, CBGs better although anion gap took a little longer to resolve.  Patient was evaluated by Psych 5-10 and was found to have capacity.  Patient INR sub-therapeutic, on heparin Gtt. Report odynophagia 5-13. Improved on Carafate. Feeling well, no new complaints.   Assessment/Plan: DKA (diabetic ketoacidoses) ([place]): A recurrent problem.  Off insulin Gtt.  Resolved.   Diabetes; brittle  Diabetic.  Continue with levemir to 15 Brittle diabetic.   Suspect hypoglycemia this am from short acting, but will observed overnight and decrease levemir.  Meals coverage 2 units, hold if doesn't eat more than 75 % of meals and  or low blood sugar   Odynophagia;  Started Carafate 5-13. Improved.   Chronic anticoagulation, mechanical aortic valve;  Supratherapeutic INR on admission at 8: Unclear etiology. continue with coumadin.  INR increase to 3.7. Discussed with pharmacist, continue with coumadin 4 mg tonight and repeat INR 5-16 and dose coumadin depending on level.    Chronic systolic/diastolic heart failure: Currently. Actually euvolemic. Will monitor closely for volume overload Essential hypertension: Blood pressure stable Insomnia AKI (acute kidney injury) ([place]): Secondary to DKA and hypovolemia. Resolved with IV fluids  Procedures:  none  Consultations:  none  Discharge Exam: Filed Vitals:   04/01/16 2152 04/02/16 0530  BP: 100/40 114/57  Pulse: 69 73  Temp: 98.5 F (36.9 C) 98 F (36.7 C)  Resp: 18 18    General: NAD Cardiovascular: S 1, S 2 RRR Respiratory: CTA Abdomen soft nt  Discharge Instructions   Discharge Instructions    Diet - low sodium heart healthy    Complete by:  As directed      Increase activity slowly    Complete by:  As directed           Current Discharge Medication List    START taking these medications   Details  senna-docusate (SENOKOT-S) 8.6-50 MG tablet Take 1 tablet by mouth 2 (two) times daily. Qty: 30 tablet, Refills: 0    sucralfate (CARAFATE) 1 GM/10ML suspension Take 10 mLs (1 g total) by mouth 4 (four) times daily -  with meals and at bedtime. Qty: 420 mL, Refills: 0      CONTINUE these medications which have CHANGED   Details  insulin aspart (NOVOLOG) 100 UNIT/ML injection Inject 2 Units into the skin 3 (three) times daily with meals. Qty: 10 mL, Refills: 2    insulin detemir (LEVEMIR) 100 UNIT/ML injection Inject 0.15 mLs (15 Units total) into the skin daily. Qty: 10 mL, Refills: 0   Associated Diagnoses: Hyperglycemia    warfarin (COUMADIN) 4 MG tablet Take 1 tablet (4 mg total) by mouth daily at 6 PM. Qty: 30 tablet,  Refills: 0      CONTINUE these medications which have NOT CHANGED   Details  Blood Glucose Monitoring Suppl ([facility]) w/Device KIT Use to check glucose 2x per day Qty: 1 each, Refills: 0    citalopram (CELEXA) 40 MG tablet Take 1 tablet (40 mg total) by mouth daily. Qty: 90 tablet, Refills: 3   Associated Diagnoses: Depression    glucose blood test strip Use as instructed Qty: 100 each, Refills: 12    lisinopril (PRINIVIL,ZESTRIL) 2.5 MG tablet Take 2.5 mg by mouth daily. Refills: 1    polyethylene glycol (MIRALAX / GLYCOLAX) packet Take 17 g by mouth 2 (two) times daily. Qty: 14 each, Refills: 0    pravastatin (PRAVACHOL) 20 MG tablet TAKE 1 TABLET BY MOUTH EVERY DAY Qty: 90 tablet, Refills: 0    traZODone (DESYREL) 50 MG tablet Take 50 mg by mouth at bedtime.  Refills: 0    omeprazole (PRILOSEC) 40 MG capsule TAKE ONE CAPSULE BY MOUTH EVERY DAY Qty: 90 capsule, Refills: 1      STOP taking these medications     ondansetron (ZOFRAN ODT) 8 MG disintegrating tablet  ondansetron (ZOFRAN) 4 MG tablet        Allergies  Allergen Reactions  . Adhesive [Tape] Other (See Comments)    Can burn skin if left on too long  . Celebrex [Celecoxib] Rash  . Detrol [Tolterodine] Hives   Follow-up Information    Follow up with Dorothyann Peng, NP.   Specialty:  Family Medicine   Contact information:   [address] [place]  [address] [phone]        The results of significant diagnostics from this hospitalization (including imaging, microbiology, ancillary and laboratory) are listed below for reference.    Significant Diagnostic Studies: Dg Chest 2 View  03/26/2016  CLINICAL DATA:  Hyperglycemia. EXAM: CHEST  2 VIEW COMPARISON:  March 17, 2016. FINDINGS: The heart size and mediastinal contours are within normal limits. Both lungs are clear. Status post cardiac valve repair. No pneumothorax or pleural effusion is noted. The visualized skeletal  structures are unremarkable. IMPRESSION: No active cardiopulmonary disease. Electronically Signed   By: Marijo Conception, M.D.   On: 03/26/2016 14:33   Dg Chest 2 View  03/09/2016  CLINICAL DATA:  Leukocytosis. EXAM: CHEST  2 VIEW COMPARISON:  02/11/2016 FINDINGS: The heart size and mediastinal contours are within normal limits. Diffuse bronchial wall thickening is identified bilaterally. Both lungs are clear. The visualized skeletal structures are unremarkable. IMPRESSION: 1. No evidence for pneumonia. 2. Bronchial wall thickening noted. Electronically Signed   By: Kerby Moors M.D.   On: 03/09/2016 09:50   Ct Abdomen Pelvis W Contrast  03/24/2016  CLINICAL DATA:  Abdominal pain. Lower abdominal pain with nausea and vomiting for 4 days. EXAM: CT ABDOMEN AND PELVIS WITH CONTRAST TECHNIQUE: Multidetector CT imaging of the abdomen and pelvis was performed using the standard protocol following bolus administration of intravenous contrast. CONTRAST:  132m ISOVUE-300 IOPAMIDOL (ISOVUE-300) INJECTION 61% COMPARISON:  02/09/2016 FINDINGS: Mild pleural thickening in the right lung base. Wall thickening around the esophagus may indicate reflux disease. Esophageal mass is not entirely excluded. Consider endoscopy if clinically indicated. Mild diffuse fatty infiltration of the liver. The gallbladder, pancreas, spleen, adrenal glands, kidneys, abdominal aorta, inferior vena cava, and retroperitoneal lymph nodes are unremarkable. Stomach, small bowel, and colon are not abnormally distended. Stool fills the colon. No free air or free fluid in the abdomen. Small umbilical hernia containing fat. Pelvis: Appendix is not identified. Bladder wall is mildly thickened possibly indicating cystitis. Calcified exophytic uterine mass is consistent with calcified fibroids. No abnormal adnexal masses. No free or loculated pelvic fluid collections. No significant pelvic lymphadenopathy. No destructive bone lesions. Compression of the  superior endplate of TR91is new since previous study and appears acute. There is about 20% loss of height. IMPRESSION: Prominent wall thickening around the distal esophagus may indicate reflux disease although esophageal mass is not entirely excluded. Mild diffuse fatty infiltration of the liver. Acute appearing anterior compression of the superior endplate of TM38 Electronically Signed   By: WLucienne CapersM.D.   On: 03/24/2016 04:49   Dg Chest Port 1 View  03/17/2016  CLINICAL DATA:  Status post right IJ line repositioning. Initial encounter. EXAM: PORTABLE CHEST 1 VIEW COMPARISON:  Chest radiograph performed earlier today at 5:59 p.m. FINDINGS: The lungs are well-aerated and clear. There is no evidence of focal opacification, pleural effusion or pneumothorax. The cardiomediastinal silhouette is normal in size; the patient is status post median sternotomy. No acute osseous abnormalities are seen. A right IJ line is noted again with its  tip slightly curved, suggesting azygos positioning, though it has been retracted since the prior study. This could be retracted another 2 cm. IMPRESSION: 1. Right IJ line noted with its tip slightly curved, suggesting azygos positioning, though it has been retracted since the prior study. This could be retracted another 2 cm. 2. No acute cardiopulmonary process seen. These results were called by telephone at the time of interpretation on 03/17/2016 at 10:18 pm to Langdon Place at the [facility], who verbally acknowledged these results. Electronically Signed   By: Garald Balding M.D.   On: 03/17/2016 22:22   Dg Chest Port 1 View  03/17/2016  CLINICAL DATA:  Acute respiratory failure EXAM: PORTABLE CHEST 1 VIEW COMPARISON:  Chest radiograph from earlier today. FINDINGS: Sternotomy wires appear aligned and intact. Right internal jugular central venous catheter enters the superior vena cava and curves medially at the tip, suggesting azygous positioning. Stable  cardiomediastinal silhouette with normal heart size. No pneumothorax. No pleural effusion. Lungs appear clear, with no acute consolidative airspace disease and no pulmonary edema. IMPRESSION: 1. Right internal jugular central venous catheter enters the SVC anchors medially at the tip, suggesting azygous positioning. Recommend retracting 2-3 cm. 2. No pneumothorax.  No active cardiopulmonary disease. These results were called by telephone at the time of interpretation on 03/17/2016 at 6:25 pm to Dr. Ashok Cordia, who verbally acknowledged these results. Electronically Signed   By: Ilona Sorrel M.D.   On: 03/17/2016 18:51   Dg Chest Portable 1 View  03/17/2016  CLINICAL DATA:  Vomiting, hyperglycemia, hypertension. History of cardiac catheterization and CABG. EXAM: PORTABLE CHEST 1 VIEW COMPARISON:  Chest x-rays dated 03/09/2016 and 09/16/2015. FINDINGS: Heart size is normal. Overall cardiomediastinal silhouette is stable in size and configuration. Median sternotomy wires appear intact and stable in alignment. Lungs are clear. Lung volumes are normal. No pleural effusion or pneumothorax seen. Osseous structures about the chest are unremarkable. IMPRESSION: No active disease. Electronically Signed   By: Franki Cabot M.D.   On: 03/17/2016 15:25    Microbiology: Recent Results (from the past 240 hour(s))  MRSA PCR Screening     Status: None   Collection Time: 03/27/16  3:49 AM  Result Value Ref Range Status   MRSA by PCR NEGATIVE NEGATIVE Final    Comment:        The GeneXpert MRSA Assay (FDA approved for NASAL specimens only), is one component of a comprehensive MRSA colonization surveillance program. It is not intended to diagnose MRSA infection nor to guide or monitor treatment for MRSA infections.      Labs: Basic Metabolic Panel:  Recent Labs Lab 03/27/16 1458 03/28/16 0713 03/29/16 0541 03/31/16 2346 04/02/16 0459  NA 132* 134* 133* 127* 137  K 3.6 3.2* 4.4 4.7 4.1  CL 101 100* 100*  94* 105  CO2 21* 20* '23 24 26  ' GLUCOSE 165* 153* 377* 485* 184*  BUN '9 8 11 11 6  ' CREATININE 0.90 0.98 0.72 0.76 0.58  CALCIUM 8.4* 8.8* 9.1 8.3* 8.8*   Liver Function Tests:  Recent Labs Lab 03/26/16 1334  AST 27  ALT 22  ALKPHOS 172*  BILITOT 1.9*  PROT 7.8  ALBUMIN 4.1    Recent Labs Lab 03/26/16 1948  LIPASE 19   No results for input(s): AMMONIA in the last 168 hours. CBC:  Recent Labs Lab 03/27/16 0339 03/28/16 0713 03/31/16 0514 04/01/16 0548 04/02/16 0459  WBC 11.7* 9.9 6.3 5.8 4.8  HGB 12.7 11.4* 10.8* 10.5* 10.0*  HCT 38.3 35.7* 33.2* 32.6* 30.2*  MCV 83.8 86.2 86.7 84.7 86.0  PLT 241 345 309 314 283   Cardiac Enzymes:  Recent Labs Lab 03/27/16 0017 03/27/16 0625  TROPONINI <0.03 <0.03   BNP: BNP (last 3 results)  Recent Labs  07/30/15 1400 03/17/16 1801 03/27/16 0832  BNP 398.4* 95.3 73.7    ProBNP (last 3 results) No results for input(s): PROBNP in the last 8760 hours.  CBG:  Recent Labs Lab 04/01/16 0836 04/01/16 1134 04/01/16 1616 04/01/16 2200 04/02/16 0745  GLUCAP 165* 169* 209* 334* 118*       Signed:  Niel Hummer A MD.  [facility] 04/02/2016, 11:10 AM

## 2016-04-02 NOTE — Progress Notes (Deleted)
Called report to nurse Lennox Laity at [facility].  Notified patient of discharge.  Patient stated understanding, no questions or concerns at this time.  Telemetry removed and CCMD notified.  IV removed.  Belongings gathered by patient.  Awaiting PTAR transfer to SNF.

## 2016-04-02 NOTE — Care Management Important Message (Signed)
Important Message  Patient Details  Name: Madison [name] MRN: [id number] Date of Birth: 08-Apr-1961   Medicare Important Message Given:  Yes    Haskell Flirt 04/02/2016, 11:14 AMImportant Message  Patient Details  Name: Madison [name] MRN: [id number] Date of Birth: 05/01/1961   Medicare Important Message Given:  Yes    Haskell Flirt 04/02/2016, 11:14 AM

## 2016-04-02 NOTE — Progress Notes (Addendum)
ANTICOAGULATION CONSULT NOTE - Follow Up Consult  Pharmacy Consult for warfarin Indication: aortic valve replacement  Allergies  Allergen Reactions  . Adhesive [Tape] Other (See Comments)    Can burn skin if left on too long  . Celebrex [Celecoxib] Rash  . Detrol [Tolterodine] Hives   Patient Measurements: Height: 5' (152.4 cm) Weight: 129 lb 9.6 oz (58.786 kg) IBW/kg (Calculated) : 45.5  Vital Signs: Temp: 98 F (36.7 C) (05/15 0530) Temp Source: Oral (05/15 0530) BP: 114/57 mmHg (05/15 0530) Pulse Rate: 73 (05/15 0530)  Labs:  Recent Labs  03/30/16 2209  03/31/16 0514 03/31/16 2346 04/01/16 0548 04/02/16 0459  HGB  --   < > 10.8*  --  10.5* 10.0*  HCT  --   --  33.2*  --  32.6* 30.2*  PLT  --   --  309  --  314 283  LABPROT  --   --  23.7*  --  28.4* 35.1*  INR  --   --  2.13*  --  2.71* 3.60*  HEPARINUNFRC 0.37  --  0.53  --  0.63  --   CREATININE  --   --   --  0.76  --  0.58  < > = values in this interval not displayed.  Estimated Creatinine Clearance: 63.7 mL/min (by C-G formula based on Cr of 0.58).  Assessment: 55 y.o. 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 INR was supratherapeutic on admission at 5.86. S/p vitamin K 5 mg SQ x1 on 5/9. Pharmacy consulted to dose warfarin.   Home warfarin dosage noted as 7.5 mg daily per anticoagulation clinic.  Today, 04/02/2016: -  INR 3.6, slightly above therapeutic range - CBC: Hgb decreased but stable, Plt remain WNL. - no bleeding documented - no significant drug-drug intxns - on carb modified diet, eating 50%, no emesis noted  Goal of Therapy:  INR 2.5-3.5 (per outpatient anticoag. Clinic) Heparin level 0.3-0.7 Monitor  platelets per protocol   Plan:  - Warfarin 4mg  PO tonight - Check daily PT/INR, CBC - Monitor for s/sx bleeding  PharmD Pager [phone] 04/02/2016, 8:30 AM

## 2016-04-03 ENCOUNTER — Other Ambulatory Visit: Payer: Self-pay

## 2016-04-03 DIAGNOSIS — E784 Other hyperlipidemia: Secondary | ICD-10-CM | POA: Diagnosis not present

## 2016-04-03 DIAGNOSIS — N179 Acute kidney failure, unspecified: Secondary | ICD-10-CM | POA: Diagnosis not present

## 2016-04-03 DIAGNOSIS — E785 Hyperlipidemia, unspecified: Secondary | ICD-10-CM | POA: Diagnosis not present

## 2016-04-03 DIAGNOSIS — I5042 Chronic combined systolic (congestive) and diastolic (congestive) heart failure: Secondary | ICD-10-CM | POA: Diagnosis not present

## 2016-04-03 DIAGNOSIS — I2510 Atherosclerotic heart disease of native coronary artery without angina pectoris: Secondary | ICD-10-CM | POA: Diagnosis not present

## 2016-04-03 DIAGNOSIS — Z5181 Encounter for therapeutic drug level monitoring: Secondary | ICD-10-CM | POA: Diagnosis not present

## 2016-04-03 DIAGNOSIS — I10 Essential (primary) hypertension: Secondary | ICD-10-CM | POA: Diagnosis not present

## 2016-04-03 DIAGNOSIS — F331 Major depressive disorder, recurrent, moderate: Secondary | ICD-10-CM | POA: Diagnosis not present

## 2016-04-03 DIAGNOSIS — I959 Hypotension, unspecified: Secondary | ICD-10-CM | POA: Diagnosis not present

## 2016-04-03 DIAGNOSIS — D6489 Other specified anemias: Secondary | ICD-10-CM | POA: Diagnosis not present

## 2016-04-03 DIAGNOSIS — E1310 Other specified diabetes mellitus with ketoacidosis without coma: Secondary | ICD-10-CM | POA: Diagnosis not present

## 2016-04-03 DIAGNOSIS — I6930 Unspecified sequelae of cerebral infarction: Secondary | ICD-10-CM | POA: Diagnosis not present

## 2016-04-03 DIAGNOSIS — F329 Major depressive disorder, single episode, unspecified: Secondary | ICD-10-CM | POA: Diagnosis not present

## 2016-04-03 DIAGNOSIS — Z952 Presence of prosthetic heart valve: Secondary | ICD-10-CM | POA: Diagnosis not present

## 2016-04-03 DIAGNOSIS — M6281 Muscle weakness (generalized): Secondary | ICD-10-CM | POA: Diagnosis not present

## 2016-04-03 DIAGNOSIS — G4709 Other insomnia: Secondary | ICD-10-CM | POA: Diagnosis not present

## 2016-04-03 DIAGNOSIS — R2681 Unsteadiness on feet: Secondary | ICD-10-CM | POA: Diagnosis not present

## 2016-04-03 DIAGNOSIS — E109 Type 1 diabetes mellitus without complications: Secondary | ICD-10-CM | POA: Diagnosis not present

## 2016-04-03 DIAGNOSIS — F4323 Adjustment disorder with mixed anxiety and depressed mood: Secondary | ICD-10-CM | POA: Diagnosis not present

## 2016-04-03 DIAGNOSIS — K219 Gastro-esophageal reflux disease without esophagitis: Secondary | ICD-10-CM | POA: Diagnosis not present

## 2016-04-03 LAB — GLUCOSE, CAPILLARY
Glucose-Capillary: 39 mg/dL — CL (ref 65–99)
Glucose-Capillary: 85 mg/dL (ref 65–99)
Glucose-Capillary: 195 mg/dL — ABNORMAL HIGH (ref 65–99)

## 2016-04-03 LAB — PROTIME-INR
INR: 3.81 — ABNORMAL HIGH (ref 0.00–1.49)
PROTHROMBIN TIME: 36.7 s — AB (ref 11.6–15.2)

## 2016-04-03 MED ORDER — INSULIN DETEMIR 100 UNIT/ML ~~LOC~~ SOLN: 10.0000 [IU] | Freq: Every day | SUBCUTANEOUS | Status: DC

## 2016-04-03 MED ORDER — INSULIN DETEMIR 100 UNIT/ML ~~LOC~~ SOLN: 12.0000 [IU] | Freq: Every day | SUBCUTANEOUS | Status: DC

## 2016-04-03 MED ORDER — WARFARIN SODIUM 4 MG PO TABS: ORAL_TABLET | ORAL | Status: DC

## 2016-04-03 MED ORDER — INSULIN DETEMIR 100 UNIT/ML ~~LOC~~ SOLN
10.0000 [IU] | Freq: Every day | SUBCUTANEOUS | Status: DC
  Filled 2016-04-03: qty 0.1

## 2016-04-03 NOTE — Progress Notes (Signed)
LCSW received call back from [facility] medical that patient is authorized for SNF:  [id number] Patient discussed with MD and can be discharged today to [facility]. Patient's sister signed paperwork on 5/15. Patient agreeable to plan and will transport by EMS.  [facility] called and patient can be accepted today. Awaiting medical dc and LCSW will completed packet and EMS form.  No other needs.  DC to SNF today. RN to call report.  Deretha Emory, MSW Clinical Social Work: [facility] [employer] [phone]

## 2016-04-03 NOTE — Progress Notes (Signed)
04/03/16 1310  Called report to [facility] [phone] Report given to [facility].

## 2016-04-03 NOTE — Progress Notes (Signed)
ANTICOAGULATION CONSULT NOTE - Follow Up Consult  Pharmacy Consult for warfarin Indication: aortic valve replacement  Allergies  Allergen Reactions  . Adhesive [Tape] Other (See Comments)    Can burn skin if left on too long  . Celebrex [Celecoxib] Rash  . Detrol [Tolterodine] Hives   Patient Measurements: Height: 5' (152.4 cm) Weight: 131 lb 1.6 oz (59.467 kg) IBW/kg (Calculated) : 45.5  Vital Signs: Temp: 97.8 F (36.6 C) (05/16 0528) Temp Source: Oral (05/16 0528) BP: 105/58 mmHg (05/16 0528) Pulse Rate: 70 (05/16 0528)  Labs:  Recent Labs  03/31/16 2346 04/01/16 0548 04/02/16 0459 04/03/16 0521  HGB  --  10.5* 10.0*  --   HCT  --  32.6* 30.2*  --   PLT  --  314 283  --   LABPROT  --  28.4* 35.1* 36.7*  INR  --  2.71* 3.60* 3.81*  HEPARINUNFRC  --  0.63  --   --   CREATININE 0.76  --  0.58  --     Estimated Creatinine Clearance: 64.1 mL/min (by C-G formula based on Cr of 0.58).  Assessment: 55 y.o. 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 INR was supratherapeutic on admission at 5.86. S/p vitamin K 5 mg SQ x1 on 5/9. Pharmacy consulted to dose warfarin.   Home warfarin dosage noted as 7.5 mg daily per anticoagulation clinic.  Today, 04/03/2016: -  INR 3.81, supratherapeutic and increasing - CBC: Hgb decreased but stable, Pltc remains WNL. - no bleeding documented - no significant drug-drug intxns - on carb modified diet  Goal of Therapy:  INR 2.5-3.5 (per outpatient anticoag clinic notes) Monitor platelets per protocol   Plan:  - Hold warfarin today - Check daily PT/INR - CBC in AM - Monitor for s/s of bleeding   Greer Pickerel, PharmD, BCPS Pager: [phone] 04/03/2016  11:29 AM

## 2016-04-03 NOTE — Care Management Note (Signed)
Case Management Note  Patient Details  Name: Madison [name] MRN: [id number] Date of Birth: [birth year]  Subjective/Objective: INR-3.81-Supratherapeutic-Chronic anticoagulation.glu-184,insulin adjustments. [facility] already active w/HHC rep Corrie Dandy will confirm if they can check pt/inr daily, & MD to respond to adjustments-await response. Also will check w/[facility] about services-await call back.CSW still following for SNF.                   Action/Plan:d/c plan HHC/SNF.   Expected Discharge Date:   (UNKNOWN)               Expected Discharge Plan:  Skilled Nursing Facility  In-House Referral:  Clinical Social Work  Discharge planning Services  CM Consult  Post Acute Care Choice:  Home Health [facility]) Choice offered to:  Patient  DME Arranged:  N/A DME Agency:  NA  HH Arranged:    HH Agency:  [facility]  Status of Service:  In process, will continue to follow  Medicare Important Message Given:  Yes Date Medicare IM Given:    Medicare IM give by:    Date Additional Medicare IM Given:    Additional Medicare Important Message give by:     If discussed at Long Length of Stay Meetings, dates discussed:    Additional Comments:  Lanier Clam, RN 04/03/2016, 10:58 AM

## 2016-04-03 NOTE — Progress Notes (Signed)
Physical Therapy Treatment Patient Details Name: Madison [name] MRN: [id number] DOB: Nov 04, 1961 Today's Date: 04/03/2016    History of Present Illness Pt adm 5/9 with N&V and hypoglycemia.  Pt admitted 4/9 with DK and , just in [facility] for same, DC'd 3/28. PMH - Difficult to control DKA, Rt CVA, CABG, L ankle fx 8/16, CAD, MI, RA and CHF    PT Comments    Pt continuing to participate well with therapy. Pt reports she is discharging to SNF on today.   Follow Up Recommendations  SNF (HHPT if SNF is not an option)     Equipment Recommendations  None recommended by PT    Recommendations for Other Services       Precautions / Restrictions Precautions Precautions: Fall Restrictions Weight Bearing Restrictions: No    Mobility  Bed Mobility Overal bed mobility: Modified Independent                Transfers Overall transfer level: Needs assistance   Transfers: Sit to/from Stand Sit to Stand: Min guard         General transfer comment:  min guard to steady in initial standing  Ambulation/Gait Ambulation/Gait assistance: Min guard Ambulation Distance (Feet): 115 Feet Assistive device: None Gait Pattern/deviations: Drifts right/left;Staggering right;Staggering left     General Gait Details: Mild unsteadiness noted intermittently on today. Pt tolerated distance well. slow gait speed.    Stairs            Wheelchair Mobility    Modified Rankin (Stroke Patients Only)       Balance           Standing balance support: During functional activity Standing balance-Leahy Scale: Fair                      Cognition Arousal/Alertness: Awake/alert Behavior During Therapy: WFL for tasks assessed/performed Overall Cognitive Status: Within Functional Limits for tasks assessed                      Exercises      General Comments        Pertinent Vitals/Pain Pain Assessment: Faces Faces Pain Scale: Hurts a little bit Pain Location:  throat/chest  Pain Descriptors / Indicators: Sore Pain Intervention(s): Monitored during session;Repositioned    Home Living                      Prior Function            PT Goals (current goals can now be found in the care plan section) Progress towards PT goals: Progressing toward goals    Frequency  Min 3X/week    PT Plan Current plan remains appropriate    Co-evaluation             End of Session Equipment Utilized During Treatment: Gait belt Activity Tolerance: Patient tolerated treatment well Patient left: in bed;with call bell/phone within reach     Time: 0931-0942 PT Time Calculation (min) (ACUTE ONLY): 11 min  Charges:  $Gait Training: 8-22 mins                    G Codes:      Rebeca Alert, MPT Pager: [phone]

## 2016-04-03 NOTE — Clinical Social Work Placement (Signed)
   CLINICAL SOCIAL WORK PLACEMENT  NOTE  Date:  04/03/2016  Patient Details  Name: Madison [name] MRN: [id number] Date of Birth: 08/18/1961  Clinical Social Work is seeking post-discharge placement for this patient at the Skilled  Nursing Facility level of care (*CSW will initial, date and re-position this form in  chart as items are completed):  Yes   Patient/family provided with [facility] Clinical Social Work Department's list of facilities offering this level of care within the geographic area requested by the patient (or if unable, by the patient's family).  Yes   Patient/family informed of their freedom to choose among providers that offer the needed level of care, that participate in Medicare, Medicaid or managed care program needed by the patient, have an available bed and are willing to accept the patient.  Yes   Patient/family informed of [facility]'s ownership interest in [facility] and [facility], as well as of the fact that they are under no obligation to receive care at these facilities.  PASRR submitted to EDS on       PASRR number received on       Existing PASRR number confirmed on 03/30/16     FL2 transmitted to all facilities in geographic area requested by pt/family on 03/30/16     FL2 transmitted to all facilities within larger geographic area on       Patient informed that his/her managed care company has contracts with or will negotiate with certain facilities, including the following:            Patient/family informed of bed offers received.  04/03/2016   Patient chooses bed at     [facility]   Physician recommends and patient chooses bed at     SNF Patient to be transferred to   on  .  04/03/2016   Patient to be transferred to facility by      EMS  Patient family notified on   of transfer.  Tracey   Name of family member notified:      sister  PHYSICIAN       Additional Comment:     _______________________________________________ Raye Sorrow, LCSW 04/03/2016, 1:24 PM

## 2016-04-03 NOTE — Discharge Summary (Signed)
Physician Discharge Summary  [name] VHQ:[id number] DOB: 11/25/1960 DOA: 03/26/2016  PCP: Dorothyann Peng, NP  Admit date: 03/26/2016 Discharge date: 04/03/2016  Time spent: 35 minutes  Recommendations for Outpatient Follow-up:  Need INR level check 5-17, further doses of coumadin depending on INR level.  Adjust diabetes medications as needed. Patient is brittle diabetic./ consider Meals coverage depending on Blodo sugar level. Would not give more than 5 units of novolog, she is brittle diabetic.  Needs Bed snack  B-met to follow renal function   Discharge Diagnoses:    DKA (diabetic ketoacidoses) (Madison [name])   AKI (acute kidney injury) (Madison [name])   Adjustment disorder with mixed anxiety and depressed mood   Hyperlipidemia   Chronic anticoagulation   Supratherapeutic INR   Essential hypertension   Insomnia   Chronic combined systolic and diastolic congestive heart failure (HCC)   Type I diabetes mellitus with complication, uncontrolled (Madison [name])   Discharge Condition: stable  Diet recommendation: Carb modified.   Filed Weights   04/01/16 0500 04/02/16 0530 04/03/16 0528  Weight: 60.51 kg (133 lb 6.4 oz) 58.786 kg (129 lb 9.6 oz) 59.467 kg (131 lb 1.6 oz)    History of present illness:  HPI: Madison [name] is a 55 y.o. 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 Patient notes the symptoms have been ongoing over the last 4-5 weeks and not improved. She is unable to keep any food or liquids down regularly. When asked about her diabetic regimen patient states that she takes her insulin as advised and that her sugars have been running in 90-180. Due to the repeated nausea and vomiting symptoms she reports lower abdominal pain and back pain. Associated symptoms also include urinary frequency and some shortness of  breath. Denies any dysuria, chest pain, fever, chills, leg swelling, diarrhea, constipation. Patient was just evaluated 3 days ago for similar complaints and hospitalized one week prior to that for diabetic ketoacidosis.  ED Course: Upon admission into the emergency department patient was found to be afebrile, heart rates up to 119, respirations up to 25, blood pressure as low as 90/51, and O2 saturations maintained. Initial lab work revealed a WBC 9.5, hemoglobin 12, platelets 439, sodium 127, potassium 4.7, chloride 90, CO2 9, BUN 17, creatinine 1.5, blood glucose of 673, glucose 673, anion gap 28. UA positive for acute sounds otherwise negative for any signs of bacteria. While in the emergency department she started on the glucose stabilized per protocol. Chest x-ray was clear of any signs of infection.  Hospital Course:  HPI/Recap of past 24 hours: Patient is a 55 year old female with past medical history of chronic systolic/diastolic heart failure, previous CVA, chronic anticoagulation and poorly controlled diabetes mellitus type 1 on insulin who is had numerous admissions over the past few months for DKA. Patient lives at home with roommates. There is some question as to whether or not she is probably taking her medications. Patient was admitted on 5/9 with complaints of nausea and vomiting and found to be in DKA again. She was placed in stepdown unit and started on IV fluids plus insulin drip and by the following morning, CBGs better although anion gap took a little longer to resolve.  Patient was evaluated by Psych 5-10 and was found to have capacity.  Patient INR sub-therapeutic, on heparin Gtt. Report odynophagia 5-13. Improved on Carafate. Feeling  well, no new complaints.   Assessment/Plan: DKA (diabetic ketoacidoses) (Madison [name]): A recurrent problem.  Off insulin Gtt.  Resolved.   Diabetes; brittle Diabetic.  Brittle diabetic. Had hypoglycemia this am. Suspect id from meals coverage at  bedtime. Will also reduce levemir to 10 units.  Suspect hypoglycemia this am from short acting, but will observed overnight and decrease levemir.  Hold meal coverage has not required significant amount   Odynophagia;  Started Carafate 5-13. Improved.   Chronic anticoagulation, mechanical aortic valve;  Supratherapeutic INR on admission at 8: Unclear etiology. continue with coumadin.  INR increase to 3.8. Hold coumadin today 5-16. Repeat INR in am and dose coumadin as needed.    Chronic systolic/diastolic heart failure: Currently. Actually euvolemic. Will monitor closely for volume overload Essential hypertension: Blood pressure stable Insomnia AKI (acute kidney injury) (Madison [name]): Secondary to DKA and hypovolemia. Resolved with IV fluids  Procedures:  none  Consultations:  none  Discharge Exam: Filed Vitals:   04/02/16 2024 04/03/16 0528  BP: 117/72 105/58  Pulse: 84 70  Temp: 98.7 F (37.1 C) 97.8 F (36.6 C)  Resp: 20 18    General: NAD Cardiovascular: S 1, S 2 RRR Respiratory: CTA Abdomen soft nt  Discharge Instructions   Discharge Instructions    Diet - low sodium heart healthy    Complete by:  As directed      Diet - low sodium heart healthy    Complete by:  As directed      Increase activity slowly    Complete by:  As directed      Increase activity slowly    Complete by:  As directed           Current Discharge Medication List    START taking these medications   Details  senna-docusate (SENOKOT-S) 8.6-50 MG tablet Take 1 tablet by mouth 2 (two) times daily. Qty: 30 tablet, Refills: 0    sucralfate (CARAFATE) 1 GM/10ML suspension Take 10 mLs (1 g total) by mouth 4 (four) times daily -  with meals and at bedtime. Qty: 420 mL, Refills: 0      CONTINUE these medications which have CHANGED   Details  insulin detemir (LEVEMIR) 100 UNIT/ML injection Inject 0.1 mLs (10 Units total) into the skin daily. Qty: 10 mL, Refills: 0   Associated  Diagnoses: Hyperglycemia    warfarin (COUMADIN) 4 MG tablet Do not take coumadin 5-16... Need INR 5-17 and dose coumadin depending on level. Qty: 30 tablet, Refills: 0      CONTINUE these medications which have NOT CHANGED   Details  Blood Glucose Monitoring Suppl (Arrey) w/Device KIT Use to check glucose 2x per day Qty: 1 each, Refills: 0    citalopram (CELEXA) 40 MG tablet Take 1 tablet (40 mg total) by mouth daily. Qty: 90 tablet, Refills: 3   Associated Diagnoses: Depression    glucose blood test strip Use as instructed Qty: 100 each, Refills: 12    polyethylene glycol (MIRALAX / GLYCOLAX) packet Take 17 g by mouth 2 (two) times daily. Qty: 14 each, Refills: 0    pravastatin (PRAVACHOL) 20 MG tablet TAKE 1 TABLET BY MOUTH EVERY DAY Qty: 90 tablet, Refills: 0    traZODone (DESYREL) 50 MG tablet Take 50 mg by mouth at bedtime.  Refills: 0    omeprazole (PRILOSEC) 40 MG capsule TAKE ONE CAPSULE BY MOUTH EVERY DAY Qty: 90 capsule, Refills: 1      STOP taking these medications  lisinopril (PRINIVIL,ZESTRIL) 2.5 MG tablet      ondansetron (ZOFRAN ODT) 8 MG disintegrating tablet      ondansetron (ZOFRAN) 4 MG tablet      insulin aspart (NOVOLOG) 100 UNIT/ML injection        Allergies  Allergen Reactions  . Adhesive [Tape] Other (See Comments)    Can burn skin if left on too long  . Celebrex [Celecoxib] Rash  . Detrol [Tolterodine] Hives   Follow-up Information    Follow up with Dorothyann Peng, NP.   Specialty:  Family Medicine   Contact information:   [address] [place] [address] [phone]        The results of significant diagnostics from this hospitalization (including imaging, microbiology, ancillary and laboratory) are listed below for reference.    Significant Diagnostic Studies: Dg Chest 2 View  03/26/2016  CLINICAL DATA:  Hyperglycemia. EXAM: CHEST  2 VIEW COMPARISON:  March 17, 2016. FINDINGS: The heart size  and mediastinal contours are within normal limits. Both lungs are clear. Status post cardiac valve repair. No pneumothorax or pleural effusion is noted. The visualized skeletal structures are unremarkable. IMPRESSION: No active cardiopulmonary disease. Electronically Signed   By: Marijo Conception, M.D.   On: 03/26/2016 14:33   Dg Chest 2 View  03/09/2016  CLINICAL DATA:  Leukocytosis. EXAM: CHEST  2 VIEW COMPARISON:  02/11/2016 FINDINGS: The heart size and mediastinal contours are within normal limits. Diffuse bronchial wall thickening is identified bilaterally. Both lungs are clear. The visualized skeletal structures are unremarkable. IMPRESSION: 1. No evidence for pneumonia. 2. Bronchial wall thickening noted. Electronically Signed   By: Kerby Moors M.D.   On: 03/09/2016 09:50   Ct Abdomen Pelvis W Contrast  03/24/2016  CLINICAL DATA:  Abdominal pain. Lower abdominal pain with nausea and vomiting for 4 days. EXAM: CT ABDOMEN AND PELVIS WITH CONTRAST TECHNIQUE: Multidetector CT imaging of the abdomen and pelvis was performed using the standard protocol following bolus administration of intravenous contrast. CONTRAST:  132m ISOVUE-300 IOPAMIDOL (ISOVUE-300) INJECTION 61% COMPARISON:  02/09/2016 FINDINGS: Mild pleural thickening in the right lung base. Wall thickening around the esophagus may indicate reflux disease. Esophageal mass is not entirely excluded. Consider endoscopy if clinically indicated. Mild diffuse fatty infiltration of the liver. The gallbladder, pancreas, spleen, adrenal glands, kidneys, abdominal aorta, inferior vena cava, and retroperitoneal lymph nodes are unremarkable. Stomach, small bowel, and colon are not abnormally distended. Stool fills the colon. No free air or free fluid in the abdomen. Small umbilical hernia containing fat. Pelvis: Appendix is not identified. Bladder wall is mildly thickened possibly indicating cystitis. Calcified exophytic uterine mass is consistent with  calcified fibroids. No abnormal adnexal masses. No free or loculated pelvic fluid collections. No significant pelvic lymphadenopathy. No destructive bone lesions. Compression of the superior endplate of TB14is new since previous study and appears acute. There is about 20% loss of height. IMPRESSION: Prominent wall thickening around the distal esophagus may indicate reflux disease although esophageal mass is not entirely excluded. Mild diffuse fatty infiltration of the liver. Acute appearing anterior compression of the superior endplate of TN82 Electronically Signed   By: WLucienne CapersM.D.   On: 03/24/2016 04:49   Dg Chest Port 1 View  03/17/2016  CLINICAL DATA:  Status post right IJ line repositioning. Initial encounter. EXAM: PORTABLE CHEST 1 VIEW COMPARISON:  Chest radiograph performed earlier today at 5:59 p.m. FINDINGS: The lungs are well-aerated and clear. There is no evidence of focal opacification,  pleural effusion or pneumothorax. The cardiomediastinal silhouette is normal in size; the patient is status post median sternotomy. No acute osseous abnormalities are seen. A right IJ line is noted again with its tip slightly curved, suggesting azygos positioning, though it has been retracted since the prior study. This could be retracted another 2 cm. IMPRESSION: 1. Right IJ line noted with its tip slightly curved, suggesting azygos positioning, though it has been retracted since the prior study. This could be retracted another 2 cm. 2. No acute cardiopulmonary process seen. These results were called by telephone at the time of interpretation on 03/17/2016 at 10:18 pm to [place] at the [facility], who verbally acknowledged these results. Electronically Signed   By: Garald Balding M.D.   On: 03/17/2016 22:22   Dg Chest Port 1 View  03/17/2016  CLINICAL DATA:  Acute respiratory failure EXAM: PORTABLE CHEST 1 VIEW COMPARISON:  Chest radiograph from earlier today. FINDINGS: Sternotomy wires  appear aligned and intact. Right internal jugular central venous catheter enters the superior vena cava and curves medially at the tip, suggesting azygous positioning. Stable cardiomediastinal silhouette with normal heart size. No pneumothorax. No pleural effusion. Lungs appear clear, with no acute consolidative airspace disease and no pulmonary edema. IMPRESSION: 1. Right internal jugular central venous catheter enters the SVC anchors medially at the tip, suggesting azygous positioning. Recommend retracting 2-3 cm. 2. No pneumothorax.  No active cardiopulmonary disease. These results were called by telephone at the time of interpretation on 03/17/2016 at 6:25 pm to Dr. Ashok Cordia, who verbally acknowledged these results. Electronically Signed   By: Ilona Sorrel M.D.   On: 03/17/2016 18:51   Dg Chest Portable 1 View  03/17/2016  CLINICAL DATA:  Vomiting, hyperglycemia, hypertension. History of cardiac catheterization and CABG. EXAM: PORTABLE CHEST 1 VIEW COMPARISON:  Chest x-rays dated 03/09/2016 and 09/16/2015. FINDINGS: Heart size is normal. Overall cardiomediastinal silhouette is stable in size and configuration. Median sternotomy wires appear intact and stable in alignment. Lungs are clear. Lung volumes are normal. No pleural effusion or pneumothorax seen. Osseous structures about the chest are unremarkable. IMPRESSION: No active disease. Electronically Signed   By: Franki Cabot M.D.   On: 03/17/2016 15:25    Microbiology: Recent Results (from the past 240 hour(s))  MRSA PCR Screening     Status: None   Collection Time: 03/27/16  3:49 AM  Result Value Ref Range Status   MRSA by PCR NEGATIVE NEGATIVE Final    Comment:        The GeneXpert MRSA Assay (FDA approved for NASAL specimens only), is one component of a comprehensive MRSA colonization surveillance program. It is not intended to diagnose MRSA infection nor to guide or monitor treatment for MRSA infections.      Labs: Basic Metabolic  Panel:  Recent Labs Lab 03/27/16 1458 03/28/16 0713 03/29/16 0541 03/31/16 2346 04/02/16 0459  NA 132* 134* 133* 127* 137  K 3.6 3.2* 4.4 4.7 4.1  CL 101 100* 100* 94* 105  CO2 21* 20* _0 GLUCOSE 165* 153* 377* 485* 184*  BUN _1 CREATININE 0.90 0.98 0.72 0.76 0.58  CALCIUM 8.4* 8.8* 9.1 8.3* 8.8*   Liver Function Tests: No results for input(s): AST, ALT, ALKPHOS, BILITOT, PROT, ALBUMIN in the last 168 hours. No results for input(s): LIPASE, AMYLASE in the last 168 hours. No results for input(s): AMMONIA in the last 168 hours. CBC:  Recent Labs Lab 03/28/16 [phone]  03/31/16 0514 04/01/16 0548 04/02/16 0459  WBC 9.9 6.3 5.8 4.8  HGB 11.4* 10.8* 10.5* 10.0*  HCT 35.7* 33.2* 32.6* 30.2*  MCV 86.2 86.7 84.7 86.0  PLT 345 309 314 283   Cardiac Enzymes: No results for input(s): CKTOTAL, CKMB, CKMBINDEX, TROPONINI in the last 168 hours. BNP: BNP (last 3 results)  Recent Labs  07/30/15 1400 03/17/16 1801 03/27/16 0832  BNP 398.4* 95.3 73.7    ProBNP (last 3 results) No results for input(s): PROBNP in the last 8760 hours.  CBG:  Recent Labs Lab 04/02/16 1143 04/02/16 1722 04/02/16 2028 04/03/16 0739 04/03/16 0846  GLUCAP 96 295* 355* 39* 85       Signed:  Niel Hummer A MD.  [facility] 04/03/2016, 12:16 PM

## 2016-04-03 NOTE — Care Management Note (Signed)
Case Management Note  Patient Details  Name: Madison [name] MRN: [id number] Date of Birth: June 04, 1961  Subjective/Objective:                    Action/Plan:d/c SNF.   Expected Discharge Date:   (UNKNOWN)               Expected Discharge Plan:  Skilled Nursing Facility  In-House Referral:  Clinical Social Work  Discharge planning Services  CM Consult  Post Acute Care Choice:  Home Health [facility]) Choice offered to:  Patient  DME Arranged:  N/A DME Agency:  NA  HH Arranged:    HH Agency:  [facility]  Status of Service:  Completed, signed off  Medicare Important Message Given:  Yes Date Medicare IM Given:    Medicare IM give by:    Date Additional Medicare IM Given:    Additional Medicare Important Message give by:     If discussed at Long Length of Stay Meetings, dates discussed:    Additional Comments:  Lanier Clam, RN 04/03/2016, 12:30 PM

## 2016-04-04 ENCOUNTER — Other Ambulatory Visit: Payer: Self-pay | Admitting: *Deleted

## 2016-04-04 DIAGNOSIS — E1310 Other specified diabetes mellitus with ketoacidosis without coma: Secondary | ICD-10-CM | POA: Diagnosis not present

## 2016-04-04 DIAGNOSIS — N179 Acute kidney failure, unspecified: Secondary | ICD-10-CM | POA: Diagnosis not present

## 2016-04-04 DIAGNOSIS — G4709 Other insomnia: Secondary | ICD-10-CM | POA: Diagnosis not present

## 2016-04-04 DIAGNOSIS — D6489 Other specified anemias: Secondary | ICD-10-CM | POA: Diagnosis not present

## 2016-04-04 DIAGNOSIS — I5042 Chronic combined systolic (congestive) and diastolic (congestive) heart failure: Secondary | ICD-10-CM | POA: Diagnosis not present

## 2016-04-04 DIAGNOSIS — F4323 Adjustment disorder with mixed anxiety and depressed mood: Secondary | ICD-10-CM | POA: Diagnosis not present

## 2016-04-04 DIAGNOSIS — I10 Essential (primary) hypertension: Secondary | ICD-10-CM | POA: Diagnosis not present

## 2016-04-04 DIAGNOSIS — I2510 Atherosclerotic heart disease of native coronary artery without angina pectoris: Secondary | ICD-10-CM | POA: Diagnosis not present

## 2016-04-04 DIAGNOSIS — E784 Other hyperlipidemia: Secondary | ICD-10-CM | POA: Diagnosis not present

## 2016-04-04 NOTE — Patient Outreach (Signed)
[facility]) Care Management  04/04/2016  Madison [name] November 14, 1961 [id number]    CSW noted that patient was discharged from [facility] on Tuesday, May 16th and placed at [facility] for short-term rehabilitative services.  CSW will schedule a visit to meet with patient while at [name] to assess and assist with social work needs and services, as well as assist with possible discharge planning needs and services.  CSW will also try to encourage patient to consider placement into a long-term care facility, as opposed to returning home to live independently. CSW made an attempt to try and contact patient today to perform phone assessment, as well as assess and assist with social needs and services, without success.  A HIPAA complaint message was left for patient on voicemail.  CSW is currently awaiting a return call. Danford Bad, BSW, MSW, LCSW  Licensed Restaurant manager, fast food Health System  Mailing [place] [address] [place], [place] [address] Physical Address-[address], [place], [place] [address] Toll Free Main # [phone] Fax # [phone] Cell # [phone]  Fax # [phone]  [name] [employer]  Discrimination is Against the [employer]. and its subsidiaries comply with applicable Federal civil rights laws and do not discriminate on the basis of race, color, national origin, age, disability, or sex. [employer]. and its subsidiaries do not exclude people or treat them differently because of race, color, national origin, age, disability, or sex.    [employer]. and its subsidiaries provide:  . Free auxiliary aids and services, such as qualified sign language interpreters, video remote interpretation, and written information in other formats to people with disabilities when such auxiliary aids and services are necessary to ensure an  equal opportunity to participate. . Free language services to people whose primary language is not English when those services are necessary to provide meaningful access, such as translated documents or oral interpretation.    If you need these services, call [phone] or if you use a TTY, call 711.   If you believe that [employer]. and its subsidiaries have failed to provide these services or discriminated in another way on the basis of race, color, national origin, age, disability, or sex, you can file a Theatre manager with:   Discrimination Grievances  [address] [address]  [place], [place] [address]   If you need help filing a grievance, call [phone] or if you use a TTY, call 711.  You can also file a civil rights complaint with the U.S. Department of Health and Health and safety inspector, Office for Civil Rights electronically through the Office for Civil Rights Complaint Portal, available at [email or web address], or by mail or phone at:   U.S. Department of Health and Human Services  [address], [place]  Room [phone], [facility]  [place], [place]. [address]  [phone], [phone] (TDD)  Complaint forms are available at [email or web address]             [facility] Multi-Language Interpreter Services  English: ATTENTION: If you do not speak English, language assistance services, free of charge, are available to you. Call [phone]  (TTY: 711).  Espaol (Spanish): ATENCIN: si habla espaol, tiene a su disposicin servicios gratuitos de asistencia lingstica. Llame al [phone] (TTY: 711).  ???? (Chinese): ?????????????????????????????? [phone] (TTY:711??  Ti?ng Vi?t (Vietnamese): CH : N?u b?n ni Ti?ng Vi?t, c cc d?ch v? h? tr? ngn ng? mi?n ph dnh cho b?n. G?i s? [phone] (TTY: 711).  ??? (  [place]): ?? : ???? ????? ?? , ?? ?? ???? ??? ???? ? ???? . [phone] (TTY: 711)??? ??? ???? .  Tagalog  (Tagalog - Filipino): PAUNAWA: Kung nagsasalita ka ng Tagalog, maaari kang gumamit ng mga serbisyo ng tulong sa wika nang walang bayad. [place] sa [phone] (TTY: 711).   [place]): :      ,      .  [phone] (: 711).  Kreyl Ayisyen ([place]): ATANSYON: Si w pale Abe People, gen svis d pou lang ki disponib gratis pou ou. Rele [phone] (TTY: 711).  [name]: ATTENTION : Si vous parlez franais, des services d'aide linguistique vous sont proposs gratuitement. Appelez le [phone] (ATS : 711).  Polski (Polish): UWAGA: Jeeli mwisz po polsku, moesz skorzysta z bezpatnej pomocy jzykowej. Zadzwo pod numer [phone] (TTY: 711).  Portugus ([place]): ATENO: Se fala portugus, encontram-se disponveis servios lingusticos, grtis. Ligue para [phone] (TTY: 711).   Italiano ([place]): ATTENZIONE: In caso la lingua parlata sia l'italiano, sono disponibili servizi di assistenza linguistica gratuiti. Chiamare il numero [phone] (TTY: 711).  Tobie Lords ([place]): ACHTUNG: Wenn Sie Deutsch sprechen, stehen Ihnen kostenlos sprachliche Hilfsdienstleistungen zur [employer]. Rufnummer: [phone] (TTY: 711).   (Arabic): [phone]   .            : .)711 :   (  ??? (Japanese): ??????????????????????????????????[phone] ?TTY?711?????????????????  ? (Farsi): [phone]  . ?   ? ?  ? ? ?~ ?  ?    : .??  (TTY: 711)  Din Bizaad (Navajo): D77 baa ak0 n7n7zin: D77 saad bee y1n7[ti'go [name], saad bee 1k1'1n7da'1wo'd66', t'11 [name] n1 h0l=, koj8' h0d77lnih [phone] (TTY: 711).

## 2016-04-04 NOTE — Patient Outreach (Signed)
Per EPIC, patient has been discharged to [name]  For short term rehabilitation  Will advise [facility] LCSW of disposition.

## 2016-04-05 DIAGNOSIS — K219 Gastro-esophageal reflux disease without esophagitis: Secondary | ICD-10-CM | POA: Diagnosis not present

## 2016-04-05 DIAGNOSIS — I10 Essential (primary) hypertension: Secondary | ICD-10-CM | POA: Diagnosis not present

## 2016-04-05 DIAGNOSIS — E109 Type 1 diabetes mellitus without complications: Secondary | ICD-10-CM | POA: Diagnosis not present

## 2016-04-06 DIAGNOSIS — E109 Type 1 diabetes mellitus without complications: Secondary | ICD-10-CM | POA: Diagnosis not present

## 2016-04-06 DIAGNOSIS — K219 Gastro-esophageal reflux disease without esophagitis: Secondary | ICD-10-CM | POA: Diagnosis not present

## 2016-04-06 DIAGNOSIS — I10 Essential (primary) hypertension: Secondary | ICD-10-CM | POA: Diagnosis not present

## 2016-04-06 DIAGNOSIS — Z952 Presence of prosthetic heart valve: Secondary | ICD-10-CM | POA: Diagnosis not present

## 2016-04-09 ENCOUNTER — Encounter: Payer: Self-pay | Admitting: *Deleted

## 2016-04-09 ENCOUNTER — Other Ambulatory Visit: Payer: Self-pay | Admitting: *Deleted

## 2016-04-09 DIAGNOSIS — E785 Hyperlipidemia, unspecified: Secondary | ICD-10-CM | POA: Diagnosis not present

## 2016-04-09 DIAGNOSIS — E109 Type 1 diabetes mellitus without complications: Secondary | ICD-10-CM | POA: Diagnosis not present

## 2016-04-09 DIAGNOSIS — I10 Essential (primary) hypertension: Secondary | ICD-10-CM | POA: Diagnosis not present

## 2016-04-09 DIAGNOSIS — Z952 Presence of prosthetic heart valve: Secondary | ICD-10-CM | POA: Diagnosis not present

## 2016-04-09 NOTE — Patient Outreach (Signed)
[name]) Care Management  04/09/2016  Madison [name] 06-Dec-1960 [id number]   CSW was able to make contact with patient today to perform the initial assessment, as well as assess and assist with social work needs and services, when [place] met with patient at [facility] where patient currently resides to receive short-term rehabilitative services.  CSW introduced self, explained role and types of services provided through [facility] ([place]).  CSW further explained to patient that CSW works with patient's RNCM, also with [facility], Erenest Rasher. CSW then explained the reason for the call, indicating that Madison [name] thought that patient would benefit from social work services and resources to assist with possible discharge planning needs and services from the skilled nursing facility.  CSW obtained two HIPAA compliant identifiers from patient, which included patient's name and date of birth. Patient admits, "I don't know what my discharge plan will be".  In one sentence, patient talks as though she will be remaining in the skilled facility for long-term care services, but in the next, patient reports that she will be living with family upon release from [name].  Patient was very vague about her current family situation and whether or not she has had an opportunity to obtain an update on the child taken from her custody.  Patient stated, "I don't wish to talk about it right now".  Patient appeared to be in good spirits, "not wanting to get depressed by thinking about it".  CSW has made a few attempts to try and contact patient's sister, Madison [name] to discuss discharge plans for patient; however, Madison [name] has been unavailable at the time of CSW's calls at no return call has been received, as of yet.  CSW will continue to follow along. Nat Christen, BSW, MSW, LCSW   Licensed Education officer, environmental Health System  Mailing [place] [address], [place], [place] [address] Physical Address-[address], [place], [place] [address] Toll Free Main # [phone] Fax # [phone] Cell # [phone]  Fax # [phone]  [email or web address] Humana  Discrimination is Against the [employer]. and its subsidiaries comply with applicable Federal civil rights laws and do not discriminate on the basis of race, color, national origin, age, disability, or sex. [place] do not exclude people or treat them differently because of race, color, national origin, age, disability, or sex.    [employer]. and its subsidiaries provide:  . Free auxiliary aids and services, such as qualified sign language interpreters, video remote interpretation, and written information in other formats to people with disabilities when such auxiliary aids and services are necessary to ensure an equal opportunity to participate. . Free language services to people whose primary language is not English when those services are necessary to provide meaningful access, such as translated documents or oral interpretation.    If you need these services, call [phone] or if you use a TTY, call 711.   If you believe that [employer]. and its subsidiaries have failed to provide these services or discriminated in another way on the basis of race, color, national origin, age, disability, or sex, you can file a Tourist information centre manager with:   Discrimination Grievances  [place] [address]   If you need help filing a grievance, call [phone] or if you use a TTY, call 711.  You can also file  a civil rights complaint with the U.S. Department of Health and Financial controller, Office for Civil Rights electronically through the Office for Civil Rights Complaint Portal, available at  OnSiteLending.nl.jsf, or by mail or phone at:   [place]. Department of Health and Human Services  [place], [place], [facility]  [place], [place]  [phone], [phone] (TDD)  Complaint forms are available at [email or web address]             [place]: ATTENTION: If you do not speak English, language assistance services, free of charge, are available to you. Call [phone]  (TTY: 861).  Espaol (Spanish): ATENCIN: si habla espaol, tiene a su disposicin servicios gratuitos de asistencia lingstica. Llame al [phone] (TTY: 552).  ???? (Chinese): ?????????????????????????????? [phone] (TTY:711??  Ti?ng Vi?t (Vietnamese): CH : N?u b?n ni Ti?ng Vi?t, c cc d?ch v? h? tr? ngn ng? mi?n ph dnh cho b?n. G?i s? [phone] (TTY: 021).  ??? ([place]): ?? : ???? ????? ?? , ?? ?? ???? ??? ???? ? ???? . [phone] (TTY: 711)??? ??? ???? .  Tagalog (Tagalog - Filipino): PAUNAWA: Kung nagsasalita ka ng Tagalog, maaari kang gumamit ng mga serbisyo ng tulong sa wika nang walang bayad. [place]awag sa [phone] (TTY: 757).   [place]): :      ,      .  [phone] (: 153).  Kreyl Ayisyen ([place]): ATANSYON: Si w pale Ethelene Hal, gen svis d pou lang ki disponib gratis pou ou. Rele [phone] (TTY: 929).  Fonnie Jarvis Marland KitchenPakistan): ATTENTION : Si vous parlez franais, des services d'aide linguistique vous sont proposs gratuitement. Appelez le [phone] (ATS : 643).  Polski (Polish): UWAGA: Jeeli mwisz po polsku, moesz skorzysta z bezpatnej pomocy jzykowej. Zadzwo pod numer [phone] (TTY: 360).  Portugus ([place]): ATENO: Se fala portugus, encontram-se disponveis servios lingusticos, grtis. Ligue para  [phone] (TTY: 818).   Italiano ([place]): ATTENZIONE: In caso la lingua parlata sia l'italiano, sono disponibili servizi di assistenza linguistica gratuiti. Chiamare il numero [phone] (TTY: 446).  Dawayne Patricia ([place]): ACHTUNG: Wenn Sie Deutsch sprechen, stehen Ihnen kostenlos sprachliche Hilfsdienstleistungen zur [employer]. Rufnummer: [phone] (TTY: 183).   (Arabic): [phone]   .            : .)103 :   (  ??? ([place]): ??????????????????????????????????[phone] ?TTY?711?????????????????  ? (Farsi): [phone]  . ?   ? ?  ? ? ?~ ?  ?    : .??  (TTY: 711)  Din Bizaad ([place]ajo): D77 baa ak0 n7n7zin: D77 saad bee y1n7[ti'go Risa Grill, saad bee 1k1'1n7da'1wo'd66', t'11 Pricilla Loveless n1 h0l=, koj8' h0d77lnih [phone] (TTY: 578).

## 2016-04-17 ENCOUNTER — Other Ambulatory Visit: Payer: Self-pay | Admitting: *Deleted

## 2016-04-17 NOTE — Patient Outreach (Signed)
[facility]) Care Management  04/17/2016  Madison [name] September 03, 1961 [id number]   CSW drove out to [facility] where patient currently resides to receive short-term rehabilitative services, but was told that patient was unavailable at the time of CSW's arrival. The receptionist at the facility chose not to elaborate, just reported that patient would be preoccupied for the next hour or so.  CSW inquired as to whether or not patient was participating with therapies (both physical and occupational) and if CSW should wait.  The receptionist then indicated that patient will need to be seen by the attending physician and encouraged CSW to reschedule the visit. CSW left a HIPAA compliant message for patient with the receptionist and is currently awaiting a return call. Danford Bad, BSW, MSW, LCSW  Licensed Restaurant manager, fast food Health System  Mailing [place] [address], [place], [place] [address] Physical Address-[address], [place], [place] [address] Toll Free Main # [phone] Fax # [phone] Cell # [phone]  Fax # [phone]  [name] [employer]  Discrimination is Against the [employer]. and its subsidiaries comply with applicable Federal civil rights laws and do not discriminate on the basis of race, color, national origin, age, disability, or sex. [employer]. and its subsidiaries do not exclude people or treat them differently because of race, color, national origin, age, disability, or sex.    [employer]. and its subsidiaries provide:  . Free auxiliary aids and services, such as qualified sign language interpreters, video remote interpretation, and written information in other formats to people with disabilities when such auxiliary aids and services are necessary to ensure an equal opportunity to participate. . Free  language services to people whose primary language is not English when those services are necessary to provide meaningful access, such as translated documents or oral interpretation.    If you need these services, call [phone] or if you use a TTY, call 711.   If you believe that [employer]. and its subsidiaries have failed to provide these services or discriminated in another way on the basis of race, color, national origin, age, disability, or sex, you can file a Theatre manager with:   Discrimination Grievances  [address] [address]  [place], [place] [address]   If you need help filing a grievance, call [phone] or if you use a TTY, call 711.  You can also file a civil rights complaint with the U.S. Department of Health and Health and safety inspector, Office for Civil Rights electronically through the Office for Civil Rights Complaint Portal, available at [email or web address], or by mail or phone at:   U.S. Department of Health and Human Services  [address], [place]  Room [phone], [facility] Building  [place], [place]. [address]  [phone], [phone] (TDD)  Complaint forms are available at [email or web address]             [facility] Multi-Language Interpreter Services  English: ATTENTION: If you do not speak English, language assistance services, free of charge, are available to you. Call [phone]  (TTY: 711).  Espaol (Spanish): ATENCIN: si habla espaol, tiene a su disposicin servicios gratuitos de asistencia lingstica. Llame al [phone] (TTY: 711).  ???? (Chinese): ?????????????????????????????? [phone] (TTY:711??  Ti?ng Vi?t (Vietnamese): CH : N?u b?n ni Ti?ng Vi?t, c cc d?ch v? h? tr? ngn ng? mi?n ph dnh cho b?n. G?i s? [phone] (TTY: 711).  ??? ([place]): ?? : ???? ????? ?? , ?? ?? ???? ??? ???? ? ???? .  [phone] (TTY: 711)??? ??? ???? .  Tagalog (Tagalog - Filipino): PAUNAWA: Kung  nagsasalita ka ng Tagalog, maaari kang gumamit ng mga serbisyo ng tulong sa wika nang walang bayad. [place]awag sa [phone] (TTY: 711).   [place]): :      ,      .  [phone] (: 711).  Kreyl Ayisyen ([place]): ATANSYON: Si w pale Abe People, gen svis d pou lang ki disponib gratis pou ou. Rele [phone] (TTY: 711).  [name]: ATTENTION : Si vous parlez franais, des services d'aide linguistique vous sont proposs gratuitement. Appelez le [phone] (ATS : 711).  Polski (Polish): UWAGA: Jeeli mwisz po polsku, moesz skorzysta z bezpatnej pomocy jzykowej. Zadzwo pod numer [phone] (TTY: 711).  Portugus ([place]): ATENO: Se fala portugus, encontram-se disponveis servios lingusticos, grtis. Ligue para [phone] (TTY: 711).   Italiano ([place]): ATTENZIONE: In caso la lingua parlata sia l'italiano, sono disponibili servizi di assistenza linguistica gratuiti. Chiamare il numero [phone] (TTY: 711).  Tobie Lords ([place]): ACHTUNG: Wenn Sie Deutsch sprechen, stehen Ihnen kostenlos sprachliche Hilfsdienstleistungen zur [employer]. Rufnummer: [phone] (TTY: 711).   (Arabic): [phone]   .            : .)711 :   (  ??? (Japanese): ??????????????????????????????????[phone] ?TTY?711?????????????????  ? (Farsi): [phone]  . ?   ? ?  ? ? ?~ ?  ?    : .??  (TTY: 711)  Din Bizaad ([place]): D77 baa ak0 n7n7zin: D77 saad bee y1n7[ti'go [name], saad bee 1k1'1n7da'1wo'd66', t'11 [name] n1 h0l=, koj8' h0d77lnih [phone] (TTY: 711).

## 2016-04-17 NOTE — Patient Outreach (Signed)
[facility]) Care Management  04/17/2016  Madison [name] Jun 20, 1961 [id number]  CSW made several attempts throughout the day today to try and make contact with patient to follow-up regarding patient's discharge plans, without success. HIPAA compliant messages were left for patient on voicemail.  CSW is currently awaiting a return call.  CSW was able to make contact with patient's sister, [name] to obtain an update on patient. According to Ms. [name], patient is scheduled for discharge from the skilled nursing facility on Friday, June 2nd and will be returning home to live alone. Ms. [name] does not wish to see this happen, as she does not feel that patient is capable of living independently.  Ms. [name] spoke a lot about patient's Diabetes and how she is unable to manage properly, fearing that patient will "eventually end up dead". Ms. [name] went on to say that patient really had no business caring for her 72-year old son, thankful that he got taken away from her when he did.  Ms. [name] admits to worrying about her sister, but really does not know what else can be done for her.  Ideally, Ms. [name] would like to see patient be placed into an assisted living facility for long-term care services, but knows that patient will not be agreeable.  Not to mention, Ms. [name] does not think that patient has the financial means to afford placement, of any kind.  CSW agreed to converse with patient, as well as the admissions coordinator at [facility] where patient currently resides, to assess and assist with possible discharge planning needs and services for patient. Danford Bad, BSW, MSW, LCSW  Licensed Restaurant manager, fast food Health System  Mailing [place] [address], [place], [place] [address] Physical Address-[address], [place], [place] [address] Toll Free  Main # [phone] Fax # [phone] Cell # [phone]  Fax # [phone]  [name]

## 2016-04-18 ENCOUNTER — Ambulatory Visit: Payer: Commercial Managed Care - HMO | Admitting: *Deleted

## 2016-04-22 DIAGNOSIS — E119 Type 2 diabetes mellitus without complications: Secondary | ICD-10-CM | POA: Diagnosis not present

## 2016-04-23 ENCOUNTER — Other Ambulatory Visit: Payer: Self-pay | Admitting: General Practice

## 2016-04-23 ENCOUNTER — Other Ambulatory Visit: Payer: Self-pay

## 2016-04-23 ENCOUNTER — Telehealth: Payer: Self-pay | Admitting: Adult Health

## 2016-04-23 DIAGNOSIS — I5042 Chronic combined systolic (congestive) and diastolic (congestive) heart failure: Secondary | ICD-10-CM | POA: Diagnosis not present

## 2016-04-23 DIAGNOSIS — E119 Type 2 diabetes mellitus without complications: Secondary | ICD-10-CM | POA: Diagnosis not present

## 2016-04-23 DIAGNOSIS — I110 Hypertensive heart disease with heart failure: Secondary | ICD-10-CM | POA: Diagnosis not present

## 2016-04-23 DIAGNOSIS — E1065 Type 1 diabetes mellitus with hyperglycemia: Secondary | ICD-10-CM | POA: Diagnosis not present

## 2016-04-23 DIAGNOSIS — I2510 Atherosclerotic heart disease of native coronary artery without angina pectoris: Secondary | ICD-10-CM | POA: Diagnosis not present

## 2016-04-23 DIAGNOSIS — I69354 Hemiplegia and hemiparesis following cerebral infarction affecting left non-dominant side: Secondary | ICD-10-CM | POA: Diagnosis not present

## 2016-04-23 MED ORDER — WARFARIN SODIUM 3 MG PO TABS
ORAL_TABLET | ORAL | Status: DC
Start: 2016-04-23 — End: 2016-05-08

## 2016-04-23 NOTE — Telephone Encounter (Signed)
Pt needs refill on coumadin send to [facility]

## 2016-04-23 NOTE — Patient Outreach (Signed)
     Call made to Morgan at [facility]. Spoke with Nelva Bush who assisted in making an appointment for patient on June 12, at 230 pm.

## 2016-04-23 NOTE — Telephone Encounter (Signed)
Dot Lanes from [facility] call to ask for PT orders.    [phone]

## 2016-04-24 ENCOUNTER — Ambulatory Visit (INDEPENDENT_AMBULATORY_CARE_PROVIDER_SITE_OTHER): Payer: Commercial Managed Care - HMO | Admitting: Family Medicine

## 2016-04-24 ENCOUNTER — Encounter: Payer: Self-pay | Admitting: Family Medicine

## 2016-04-24 ENCOUNTER — Telehealth: Payer: Self-pay | Admitting: Adult Health

## 2016-04-24 ENCOUNTER — Other Ambulatory Visit: Payer: Self-pay | Admitting: *Deleted

## 2016-04-24 VITALS — BP 108/54 | HR 100 | Temp 98.1°F | Resp 12 | Ht 63.0 in | Wt 119.9 lb

## 2016-04-24 DIAGNOSIS — M26622 Arthralgia of left temporomandibular joint: Secondary | ICD-10-CM

## 2016-04-24 DIAGNOSIS — I2510 Atherosclerotic heart disease of native coronary artery without angina pectoris: Secondary | ICD-10-CM | POA: Diagnosis not present

## 2016-04-24 DIAGNOSIS — E1065 Type 1 diabetes mellitus with hyperglycemia: Secondary | ICD-10-CM | POA: Diagnosis not present

## 2016-04-24 DIAGNOSIS — I69354 Hemiplegia and hemiparesis following cerebral infarction affecting left non-dominant side: Secondary | ICD-10-CM | POA: Diagnosis not present

## 2016-04-24 DIAGNOSIS — H6123 Impacted cerumen, bilateral: Secondary | ICD-10-CM

## 2016-04-24 DIAGNOSIS — E119 Type 2 diabetes mellitus without complications: Secondary | ICD-10-CM | POA: Diagnosis not present

## 2016-04-24 DIAGNOSIS — I110 Hypertensive heart disease with heart failure: Secondary | ICD-10-CM | POA: Diagnosis not present

## 2016-04-24 DIAGNOSIS — I5042 Chronic combined systolic (congestive) and diastolic (congestive) heart failure: Secondary | ICD-10-CM | POA: Diagnosis not present

## 2016-04-24 MED ORDER — HYDROCODONE-ACETAMINOPHEN 5-325 MG PO TABS: 1.0000 | ORAL_TABLET | Freq: Three times a day (TID) | ORAL | Status: DC | PRN

## 2016-04-24 NOTE — Patient Instructions (Signed)
A few things to remember from today's visit:   1. Arthralgia of left temporomandibular joint  - HYDROcodone-acetaminophen (NORCO/VICODIN) 5-325 MG tablet; Take 1 tablet by mouth every 8 (eight) hours as needed for moderate pain.  Dispense: 21 tablet; Refill: 0  2. Cerumen impaction, bilateral OTC Debrox as needed, no Q tips.       If you sign-up for My chart, you can communicate easier with [place] in case you have any question or concern.     Temporomandibular Joint Syndrome Temporomandibular joint (TMJ) syndrome is a condition that affects the joints between your jaw and your skull. The TMJs are located near your ears and allow your jaw to open and close. These joints and the nearby muscles are involved in all movements of the jaw. People with TMJ syndrome have pain in the area of these joints and muscles. Chewing, biting, or other movements of the jaw can be difficult or painful. TMJ syndrome can be caused by various things. In many cases, the condition is mild and goes away within a few weeks. For some people, the condition can become a long-term problem. CAUSES Possible causes of TMJ syndrome include:  Grinding your teeth or clenching your jaw. Some people do this when they are under stress.  Arthritis.  Injury to the jaw.  Head or neck injury.  Teeth or dentures that are not aligned well. In some cases, the cause of TMJ syndrome may not be known.   SIGNS AND SYMPTOMS The most common symptom is an aching pain on the side of the head in the area of the TMJ. Other symptoms may include:  Pain when moving your jaw, such as when chewing or biting.  Being unable to open your jaw all the way.  Making a clicking sound when you open your mouth.  Headache.  Earache.  Neck or shoulder pain.   DIAGNOSIS Diagnosis can usually be made based on your symptoms, your medical history, and a physical exam.    You may need to see your dentist to determine if your teeth and jaw are  lined up correctly.   TREATMENT TMJ syndrome often goes away on its own. If treatment is needed, the options may include:  Eating soft foods and applying ice or heat.  Medicines to relieve pain or inflammation.  Medicines to relax the muscles.  A splint, bite plate, or mouthpiece to prevent teeth grinding or jaw clenching.  Relaxation techniques or counseling to help reduce stress.  Transcutaneous electrical nerve stimulation (TENS). This helps to relieve pain by applying an electrical current through the skin.  Acupuncture. This is sometimes helpful to relieve pain.  Jaw surgery. This is rarely needed.   HOME CARE INSTRUCTIONS  Take medicines only as directed by your health care provider.  Eat a soft diet if you are having trouble chewing.  Apply ice to the painful area.  Put ice in a plastic bag.  Place a towel between your skin and the bag.  Leave the ice on for 20 minutes, 2-3 times a day.  Apply a warm compress to the painful area as directed.  Massage your jaw area and perform any jaw stretching exercises as recommended by your health care provider.  If you were given a mouthpiece or bite plate, wear it as directed.  Avoid foods that require a lot of chewing. Do not chew gum. Keep all follow-up visits as directed by your health care provider. This is important.  SEEK MEDICAL CARE IF:  You  are having trouble eating. You have new or worsening symptoms.  SEEK IMMEDIATE MEDICAL CARE IF:  Your jaw locks open or closed.   This information is not intended to replace advice given to you by your health care provider. Make sure you discuss any questions you have with your health care provider.   Document Released: 07/31/2001 Document Revised: 11/26/2014 Document Reviewed: 06/10/2014 Elsevier Interactive Patient Education [employer]

## 2016-04-24 NOTE — Progress Notes (Addendum)
HPI:   [name] is a 55 y.o.female here today complaining of 3 days of left earache, points to left preauricular area.    She has not noted fever,chills, unusual fatigue, rhinorrhea, nasal congestion, odynophagia, or oral lesions. No hearing loss or tinnitus.   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 She denies headache, facial numbness or tingling.  She has not noted cough,chest pain, dyspnea, or wheezing.  No Hx of recent travel. No sick contact. No known insect bite. + Hx of allergies.  She as not tried OTC.  Pain is sharp, constant but worse with mouth opening,chewing, and palpation. 10/10, no radiated to neck or head. Alleviated by resting joint. She has dentures, removed them for a couple days and not better. She has not noted gum lesions or bleeding. She has not followed with dentist in "a while." Symptoms otherwise stable.    Review of Systems  Constitutional: Negative for fever, appetite change and fatigue.  HENT: Positive for ear pain. Negative for dental problem, drooling, ear discharge, hearing loss, mouth sores, nosebleeds, sinus pressure, sore throat, tinnitus, trouble swallowing and voice change.   Eyes: Negative for pain, discharge, redness and itching.  Respiratory: Negative for shortness of breath and wheezing.   Cardiovascular: Negative for chest pain and palpitations.  Gastrointestinal: Negative for nausea, vomiting, abdominal pain and diarrhea.  Musculoskeletal: Positive for arthralgias and gait problem. Negative for back pain and neck pain.       Hx of RA.  Skin: Negative for rash.  Allergic/Immunologic: Positive for environmental allergies.  Neurological: Positive for weakness (Left-sided ,s/p CVA). Negative for syncope, numbness and headaches.  Hematological: Negative for adenopathy.  Psychiatric/Behavioral: Positive for sleep disturbance. Negative for confusion. The patient is nervous/anxious.        Current Outpatient Prescriptions on File Prior to Visit  Medication Sig Dispense Refill  . Blood Glucose Monitoring Suppl ([place]) w/Device KIT Use to check glucose 2x per day 1 each 0  . citalopram (CELEXA) 40 MG tablet Take 1 tablet (40 mg total) by mouth daily. 90 tablet 3  . glucose blood test strip Use as instructed (Patient taking differently: 1 each by Other route as needed (as instructed). Use as instructed) 100 each 12  . insulin detemir (LEVEMIR) 100 UNIT/ML injection Inject 0.1 mLs (10 Units total) into the skin daily. (Patient taking differently: Inject 25 Units into the skin daily. ) 10 mL 0  . omeprazole (PRILOSEC) 40 MG capsule TAKE ONE CAPSULE BY MOUTH EVERY DAY 90 capsule 1  . pravastatin (PRAVACHOL) 20 MG tablet TAKE 1 TABLET BY MOUTH EVERY DAY 90 tablet 0  . sucralfate (CARAFATE) 1 GM/10ML suspension Take 10 mLs (1 g total) by mouth 4 (four) times daily -  with meals and at bedtime. 420 mL 0  . traZODone (DESYREL) 50 MG tablet Take 50 mg by mouth at bedtime.   0  . warfarin (COUMADIN) 3 MG tablet Take as directed by anticoagulation clinic. 30 tablet 0   No current facility-administered medications on file prior to visit.     Past Medical History  Diagnosis Date  . CAD (coronary artery disease)   . Obesity   . Hypercholesteremia   . HTN (hypertension)   . Dyslipidemia   . Aortic stenosis   . Thrombophlebitis   . Heart murmur   . Myocardial infarction ([place]) 12/2014  . Pneumonia 11/2014; 12/2014  . GERD (gastroesophageal reflux disease)   .  Stroke syndrome [facility]) 1995; 2001    "when my son was born; problems w/speech and L hand since then" (01/19/2015)  . Anxiety   . Depression   . Allergy   . CHF (congestive heart failure) ([place])   . Rheumatoid arthritis(714.0)     "hands" (07/01/2015)  . Diabetes ([place]) dx'd 1982    "went straight to insulin"   Allergies  Allergen Reactions  . Adhesive [Tape] Other (See Comments)    Can burn skin if left  on too long  . Celebrex [Celecoxib] Rash  . Detrol [Tolterodine] Hives    Social History   Social History  . Marital Status: Divorced    Spouse Name: N/A  . Number of Children: N/A  . Years of Education: N/A   Social History Main Topics  . Smoking status: Never Smoker   . Smokeless tobacco: Never Used  . Alcohol Use: No  . Drug Use: No  . Sexual Activity: No   Other Topics Concern  . None   Social History Narrative   Divroced and lives at home.    Has two sons who live in [place] at this time. (21&8 y/o).    Has a cat    Walks a lot.       She is having great success with her diet       Working with exercises that were given to her by PT/OT.     Filed Vitals:   04/24/16 1302  BP: 108/54  Pulse: 100  Temp: 98.1 F (36.7 C)  Resp: 12   Body mass index is 21.24 kg/(m^2).   SpO2 Readings from Last 3 Encounters:  04/24/16 98%  04/03/16 100%  03/24/16 96%      Physical Exam  Nursing note and vitals reviewed. Constitutional: She is oriented to person, place, and time. She appears well-nourished. She does not appear ill. No distress.  HENT:  Head: Atraumatic.  Right Ear: Hearing normal.  Left Ear: Hearing normal.  Nose: Right sinus exhibits no maxillary sinus tenderness and no frontal sinus tenderness. Left sinus exhibits no maxillary sinus tenderness and no frontal sinus tenderness.  Mouth/Throat: Oropharynx is clear and moist and mucous membranes are normal. She has dentures.  Cerumen impaction bilateral, no erythema appreciated in ear canal, no pain during otoscopic exam. Mildly limited mouth opening due to pain. Right TMJ crepitus, no pain. Left TMJ painful with palpation. No local edema.  Eyes: Conjunctivae and EOM are normal.  Neck: No muscular tenderness present. No edema and no erythema present.  Cardiovascular: Normal rate and regular rhythm.   Murmur heard. RUSB I-II/VI.  Lymphadenopathy:       Head (right side): No submandibular adenopathy  present.       Head (left side): No submandibular adenopathy present.    She has no cervical adenopathy.  Neurological: She is alert and oriented to person, place, and time. No cranial nerve deficit. Gait abnormal.  Left upper extremity weakness, limitation of ROM, spastic. Gait assisted with cane.  Skin: Skin is warm. No rash noted. No erythema.  Psychiatric: Her mood appears anxious.  Well groomed, good eye contact.      ASSESSMENT AND PLAN:     Madison [name] was seen today for otalgia.  Diagnoses and all orders for this visit:  Arthralgia of left temporomandibular joint  Pain she is referring to seem to be localized on TMJ. Explained this could be arthritis vs TMJ synd. Soft food recommended, handout given. Local heart may also help. Because  her hx of CVD, NSAID's are not indicated for pain control, so Hydrocodone Rx given. Clearly discussed side effects, she states that she has taken it before and has helped with pain (OA). F/U with pcp in a week.  -     HYDROcodone-acetaminophen (NORCO/VICODIN) 5-325 MG tablet; Take 1 tablet by mouth every 8 (eight) hours as needed for moderate pain.  Cerumen impaction, bilateral  OTC Debrox or mineral oil. No ear lavage recommended for now. No Q tips. F/U as needed.         -Patient advised to return or notify a doctor immediately if symptoms worsen or persist or new concerns arise.       Betty G. Martinique, MD  [facility]. [place] office.

## 2016-04-24 NOTE — Telephone Encounter (Signed)
Christa notified of Cory's comments.

## 2016-04-24 NOTE — Telephone Encounter (Signed)
Insulin Novolog 5 units with each meal and levemir 25 units in the a.m.  RN would like clarification pls call [phone].

## 2016-04-24 NOTE — Progress Notes (Signed)
Pre visit review using our clinic review tool, if applicable. No additional management support is needed unless otherwise documented below in the visit note. 

## 2016-04-24 NOTE — Telephone Encounter (Signed)
Per [name] - ok to give PT orders. Christa notified. Thanks

## 2016-04-24 NOTE — Telephone Encounter (Signed)
Left message for Madison [name] to return phone call.

## 2016-04-24 NOTE — Patient Outreach (Signed)
[facility]) Care Management  04/24/2016  BONA HUBBARD 07-Sep-1961 [id number]   CSW drove to [facility] today, Skilled Nursing Facility where patient currently resides to receive short-term rehabilitative services, to attend patient's Discharge Planning Meeting, only to learn that the meeting has been cancelled.  No one from the facility had contacted CSW to inform her of the cancellation.  Further, CSW learned that patient was not even at the facility, having left via facility transport to attend a physician appointment at the [facility].  CSW left a HIPAA compliant message for patient with the receptionist at the facility.  CSW is currently awaiting a return call.  CSW will contact patient in two weeks, upon CSW's return from vacation. Danford Bad, BSW, MSW, LCSW  Licensed Restaurant manager, [employer] [facility]  Mailing [place] [address] [place], [place] [address] Physical Address-[address], [place], [place] [address] Toll Free Main # [phone] Fax # [phone] Cell # [phone]  Fax # [phone]  [name]

## 2016-04-24 NOTE — Telephone Encounter (Signed)
10 units of levimer in the am . An continue with the 5 units novolog depending on blood sugar levels

## 2016-04-24 NOTE — Telephone Encounter (Signed)
Please advise 

## 2016-04-25 ENCOUNTER — Other Ambulatory Visit: Payer: Self-pay | Admitting: Adult Health

## 2016-04-25 ENCOUNTER — Telehealth: Payer: Self-pay | Admitting: Adult Health

## 2016-04-25 DIAGNOSIS — E119 Type 2 diabetes mellitus without complications: Secondary | ICD-10-CM | POA: Diagnosis not present

## 2016-04-25 NOTE — Telephone Encounter (Signed)
Verbal order given to [facility].

## 2016-04-25 NOTE — Telephone Encounter (Signed)
Ok

## 2016-04-25 NOTE — Telephone Encounter (Signed)
It looks like Vesicare has been discontinued - ok to refill?

## 2016-04-25 NOTE — Telephone Encounter (Signed)
[facility] with [facility] saw pt yesterday for home health. Would like a verbal to add Speech therapy and a Child psychotherapist.

## 2016-04-25 NOTE — Telephone Encounter (Signed)
Ok to give verbal order.

## 2016-04-26 DIAGNOSIS — I110 Hypertensive heart disease with heart failure: Secondary | ICD-10-CM | POA: Diagnosis not present

## 2016-04-26 DIAGNOSIS — I69354 Hemiplegia and hemiparesis following cerebral infarction affecting left non-dominant side: Secondary | ICD-10-CM | POA: Diagnosis not present

## 2016-04-26 DIAGNOSIS — E1065 Type 1 diabetes mellitus with hyperglycemia: Secondary | ICD-10-CM | POA: Diagnosis not present

## 2016-04-26 DIAGNOSIS — E119 Type 2 diabetes mellitus without complications: Secondary | ICD-10-CM | POA: Diagnosis not present

## 2016-04-26 DIAGNOSIS — I5042 Chronic combined systolic (congestive) and diastolic (congestive) heart failure: Secondary | ICD-10-CM | POA: Diagnosis not present

## 2016-04-26 DIAGNOSIS — I2510 Atherosclerotic heart disease of native coronary artery without angina pectoris: Secondary | ICD-10-CM | POA: Diagnosis not present

## 2016-04-26 NOTE — Telephone Encounter (Signed)
Patient states that she does not need this medication refilled at the moment. Will refuse.

## 2016-04-26 NOTE — Telephone Encounter (Signed)
My notes show that she has not been taking this medication for about a year. This mediation is for over active bladder issues. Does she need a medication for this?

## 2016-04-27 ENCOUNTER — Other Ambulatory Visit: Payer: Self-pay | Admitting: Adult Health

## 2016-04-27 DIAGNOSIS — E1065 Type 1 diabetes mellitus with hyperglycemia: Secondary | ICD-10-CM | POA: Diagnosis not present

## 2016-04-27 DIAGNOSIS — I69354 Hemiplegia and hemiparesis following cerebral infarction affecting left non-dominant side: Secondary | ICD-10-CM | POA: Diagnosis not present

## 2016-04-27 DIAGNOSIS — I110 Hypertensive heart disease with heart failure: Secondary | ICD-10-CM | POA: Diagnosis not present

## 2016-04-27 DIAGNOSIS — E119 Type 2 diabetes mellitus without complications: Secondary | ICD-10-CM | POA: Diagnosis not present

## 2016-04-27 DIAGNOSIS — I5042 Chronic combined systolic (congestive) and diastolic (congestive) heart failure: Secondary | ICD-10-CM | POA: Diagnosis not present

## 2016-04-27 DIAGNOSIS — I2510 Atherosclerotic heart disease of native coronary artery without angina pectoris: Secondary | ICD-10-CM | POA: Diagnosis not present

## 2016-04-27 NOTE — Telephone Encounter (Signed)
I do not see this medication on patient's current medication list - ok to refill? 

## 2016-04-27 NOTE — Telephone Encounter (Signed)
Do not fill. She is no longer taking it

## 2016-04-28 DIAGNOSIS — E119 Type 2 diabetes mellitus without complications: Secondary | ICD-10-CM | POA: Diagnosis not present

## 2016-04-29 ENCOUNTER — Other Ambulatory Visit: Payer: Self-pay | Admitting: Adult Health

## 2016-04-29 DIAGNOSIS — E119 Type 2 diabetes mellitus without complications: Secondary | ICD-10-CM | POA: Diagnosis not present

## 2016-04-30 ENCOUNTER — Inpatient Hospital Stay (HOSPITAL_COMMUNITY)
Admission: EM | Admit: 2016-04-30 | Discharge: 2016-05-08 | DRG: 871 | Disposition: A | Discharge status: Home Health | Payer: Commercial Managed Care - HMO | Attending: Internal Medicine | Admitting: Internal Medicine

## 2016-04-30 ENCOUNTER — Ambulatory Visit (INDEPENDENT_AMBULATORY_CARE_PROVIDER_SITE_OTHER): Payer: Commercial Managed Care - HMO | Admitting: Adult Health

## 2016-04-30 ENCOUNTER — Emergency Department (HOSPITAL_COMMUNITY): Payer: Commercial Managed Care - HMO

## 2016-04-30 ENCOUNTER — Encounter: Payer: Self-pay | Admitting: Adult Health

## 2016-04-30 ENCOUNTER — Ambulatory Visit (INDEPENDENT_AMBULATORY_CARE_PROVIDER_SITE_OTHER): Payer: Commercial Managed Care - HMO | Admitting: General Practice

## 2016-04-30 VITALS — BP 130/60 | HR 125 | Wt 120.0 lb

## 2016-04-30 DIAGNOSIS — E871 Hypo-osmolality and hyponatremia: Secondary | ICD-10-CM | POA: Diagnosis present

## 2016-04-30 DIAGNOSIS — E0811 Diabetes mellitus due to underlying condition with ketoacidosis with coma: Secondary | ICD-10-CM | POA: Diagnosis not present

## 2016-04-30 DIAGNOSIS — E08649 Diabetes mellitus due to underlying condition with hypoglycemia without coma: Secondary | ICD-10-CM | POA: Diagnosis not present

## 2016-04-30 DIAGNOSIS — R6520 Severe sepsis without septic shock: Secondary | ICD-10-CM

## 2016-04-30 DIAGNOSIS — R6521 Severe sepsis with septic shock: Secondary | ICD-10-CM | POA: Diagnosis not present

## 2016-04-30 DIAGNOSIS — E1065 Type 1 diabetes mellitus with hyperglycemia: Secondary | ICD-10-CM

## 2016-04-30 DIAGNOSIS — E108 Type 1 diabetes mellitus with unspecified complications: Secondary | ICD-10-CM | POA: Diagnosis not present

## 2016-04-30 DIAGNOSIS — E0810 Diabetes mellitus due to underlying condition with ketoacidosis without coma: Secondary | ICD-10-CM | POA: Diagnosis not present

## 2016-04-30 DIAGNOSIS — E109 Type 1 diabetes mellitus without complications: Secondary | ICD-10-CM | POA: Diagnosis present

## 2016-04-30 DIAGNOSIS — E872 Acidosis: Secondary | ICD-10-CM

## 2016-04-30 DIAGNOSIS — J9600 Acute respiratory failure, unspecified whether with hypoxia or hypercapnia: Secondary | ICD-10-CM | POA: Diagnosis not present

## 2016-04-30 DIAGNOSIS — Z6822 Body mass index (BMI) 22.0-22.9, adult: Secondary | ICD-10-CM | POA: Diagnosis not present

## 2016-04-30 DIAGNOSIS — I252 Old myocardial infarction: Secondary | ICD-10-CM

## 2016-04-30 DIAGNOSIS — E162 Hypoglycemia, unspecified: Secondary | ICD-10-CM | POA: Diagnosis not present

## 2016-04-30 DIAGNOSIS — J9601 Acute respiratory failure with hypoxia: Secondary | ICD-10-CM | POA: Diagnosis not present

## 2016-04-30 DIAGNOSIS — E875 Hyperkalemia: Secondary | ICD-10-CM | POA: Diagnosis present

## 2016-04-30 DIAGNOSIS — R579 Shock, unspecified: Secondary | ICD-10-CM | POA: Diagnosis not present

## 2016-04-30 DIAGNOSIS — Z954 Presence of other heart-valve replacement: Secondary | ICD-10-CM | POA: Diagnosis not present

## 2016-04-30 DIAGNOSIS — E785 Hyperlipidemia, unspecified: Secondary | ICD-10-CM | POA: Diagnosis present

## 2016-04-30 DIAGNOSIS — Z9842 Cataract extraction status, left eye: Secondary | ICD-10-CM

## 2016-04-30 DIAGNOSIS — E1010 Type 1 diabetes mellitus with ketoacidosis without coma: Secondary | ICD-10-CM | POA: Diagnosis present

## 2016-04-30 DIAGNOSIS — G9340 Encephalopathy, unspecified: Secondary | ICD-10-CM | POA: Diagnosis not present

## 2016-04-30 DIAGNOSIS — Z952 Presence of prosthetic heart valve: Secondary | ICD-10-CM

## 2016-04-30 DIAGNOSIS — I5022 Chronic systolic (congestive) heart failure: Secondary | ICD-10-CM | POA: Diagnosis present

## 2016-04-30 DIAGNOSIS — R4182 Altered mental status, unspecified: Secondary | ICD-10-CM | POA: Diagnosis present

## 2016-04-30 DIAGNOSIS — Z794 Long term (current) use of insulin: Secondary | ICD-10-CM | POA: Diagnosis not present

## 2016-04-30 DIAGNOSIS — Z951 Presence of aortocoronary bypass graft: Secondary | ICD-10-CM | POA: Diagnosis not present

## 2016-04-30 DIAGNOSIS — K219 Gastro-esophageal reflux disease without esophagitis: Secondary | ICD-10-CM | POA: Diagnosis present

## 2016-04-30 DIAGNOSIS — N179 Acute kidney failure, unspecified: Secondary | ICD-10-CM | POA: Diagnosis not present

## 2016-04-30 DIAGNOSIS — E1011 Type 1 diabetes mellitus with ketoacidosis with coma: Secondary | ICD-10-CM | POA: Diagnosis not present

## 2016-04-30 DIAGNOSIS — E876 Hypokalemia: Secondary | ICD-10-CM | POA: Diagnosis not present

## 2016-04-30 DIAGNOSIS — R000 Tachycardia, unspecified: Secondary | ICD-10-CM | POA: Diagnosis not present

## 2016-04-30 DIAGNOSIS — D649 Anemia, unspecified: Secondary | ICD-10-CM | POA: Diagnosis present

## 2016-04-30 DIAGNOSIS — R791 Abnormal coagulation profile: Secondary | ICD-10-CM | POA: Diagnosis present

## 2016-04-30 DIAGNOSIS — R402431 Glasgow coma scale score 3-8, in the field [EMT or ambulance]: Secondary | ICD-10-CM | POA: Diagnosis not present

## 2016-04-30 DIAGNOSIS — IMO0002 Reserved for concepts with insufficient information to code with codable children: Secondary | ICD-10-CM

## 2016-04-30 DIAGNOSIS — E1311 Other specified diabetes mellitus with ketoacidosis with coma: Secondary | ICD-10-CM

## 2016-04-30 DIAGNOSIS — A419 Sepsis, unspecified organism: Secondary | ICD-10-CM | POA: Diagnosis not present

## 2016-04-30 DIAGNOSIS — I2510 Atherosclerotic heart disease of native coronary artery without angina pectoris: Secondary | ICD-10-CM | POA: Diagnosis present

## 2016-04-30 DIAGNOSIS — E119 Type 2 diabetes mellitus without complications: Secondary | ICD-10-CM | POA: Diagnosis not present

## 2016-04-30 DIAGNOSIS — D6489 Other specified anemias: Secondary | ICD-10-CM | POA: Diagnosis present

## 2016-04-30 DIAGNOSIS — R69 Illness, unspecified: Secondary | ICD-10-CM

## 2016-04-30 DIAGNOSIS — E1110 Type 2 diabetes mellitus with ketoacidosis without coma: Secondary | ICD-10-CM | POA: Diagnosis present

## 2016-04-30 DIAGNOSIS — E669 Obesity, unspecified: Secondary | ICD-10-CM | POA: Diagnosis present

## 2016-04-30 DIAGNOSIS — E1169 Type 2 diabetes mellitus with other specified complication: Secondary | ICD-10-CM | POA: Diagnosis not present

## 2016-04-30 DIAGNOSIS — E1310 Other specified diabetes mellitus with ketoacidosis without coma: Secondary | ICD-10-CM | POA: Diagnosis not present

## 2016-04-30 DIAGNOSIS — J9690 Respiratory failure, unspecified, unspecified whether with hypoxia or hypercapnia: Secondary | ICD-10-CM

## 2016-04-30 DIAGNOSIS — R0602 Shortness of breath: Secondary | ICD-10-CM | POA: Diagnosis not present

## 2016-04-30 DIAGNOSIS — E860 Dehydration: Secondary | ICD-10-CM | POA: Diagnosis present

## 2016-04-30 DIAGNOSIS — I110 Hypertensive heart disease with heart failure: Secondary | ICD-10-CM | POA: Diagnosis present

## 2016-04-30 DIAGNOSIS — E10649 Type 1 diabetes mellitus with hypoglycemia without coma: Secondary | ICD-10-CM | POA: Diagnosis present

## 2016-04-30 DIAGNOSIS — R571 Hypovolemic shock: Secondary | ICD-10-CM

## 2016-04-30 DIAGNOSIS — G9341 Metabolic encephalopathy: Secondary | ICD-10-CM | POA: Diagnosis present

## 2016-04-30 DIAGNOSIS — I69354 Hemiplegia and hemiparesis following cerebral infarction affecting left non-dominant side: Secondary | ICD-10-CM | POA: Diagnosis not present

## 2016-04-30 DIAGNOSIS — Z7901 Long term (current) use of anticoagulants: Secondary | ICD-10-CM

## 2016-04-30 DIAGNOSIS — R0902 Hypoxemia: Secondary | ICD-10-CM | POA: Diagnosis not present

## 2016-04-30 DIAGNOSIS — E0865 Diabetes mellitus due to underlying condition with hyperglycemia: Secondary | ICD-10-CM | POA: Diagnosis not present

## 2016-04-30 DIAGNOSIS — T45515A Adverse effect of anticoagulants, initial encounter: Secondary | ICD-10-CM | POA: Diagnosis present

## 2016-04-30 LAB — URINALYSIS, ROUTINE W REFLEX MICROSCOPIC
Bilirubin Urine: NEGATIVE
Hgb urine dipstick: NEGATIVE
Ketones, ur: >80 mg/dL — AB
LEUKOCYTES UA: NEGATIVE
Nitrite: NEGATIVE
PH: 5 (ref 5.0–8.0)
Protein, ur: NEGATIVE mg/dL
SPECIFIC GRAVITY, URINE: 1.025 (ref 1.005–1.030)

## 2016-04-30 LAB — I-STAT ARTERIAL BLOOD GAS, ED
Acid-base deficit: 29 mmol/L — ABNORMAL HIGH (ref 0.0–2.0)
BICARBONATE: 3.8 meq/L — AB (ref 20.0–24.0)
O2 Saturation: 100 %
PCO2 ART: 22.9 mmHg — AB (ref 35.0–45.0)
PO2 ART: 618 mmHg — AB (ref 80.0–100.0)
Patient temperature: 98.6
TCO2: <5 mmol/L (ref 0–100)
pH, Arterial: 6.828 — CL (ref 7.350–7.450)

## 2016-04-30 LAB — CBC WITH DIFFERENTIAL/PLATELET
BASOS PCT: 0 %
Basophils Absolute: 0 10*3/uL (ref 0.0–0.1)
EOS PCT: 0 %
Eosinophils Absolute: 0 10*3/uL (ref 0.0–0.7)
HCT: 31.4 % — ABNORMAL LOW (ref 36.0–46.0)
HEMOGLOBIN: 9 g/dL — AB (ref 12.0–15.0)
LYMPHS PCT: 20 %
Lymphs Abs: 4.9 10*3/uL — ABNORMAL HIGH (ref 0.7–4.0)
MCH: 27.3 pg (ref 26.0–34.0)
MCHC: 28.7 g/dL — ABNORMAL LOW (ref 30.0–36.0)
MCV: 95.2 fL (ref 78.0–100.0)
Monocytes Absolute: 1.2 10*3/uL — ABNORMAL HIGH (ref 0.1–1.0)
Monocytes Relative: 5 %
NEUTROS PCT: 75 %
Neutro Abs: 18.2 10*3/uL — ABNORMAL HIGH (ref 1.7–7.7)
Platelets: 425 10*3/uL — ABNORMAL HIGH (ref 150–400)
RBC: 3.3 MIL/uL — AB (ref 3.87–5.11)
RDW: 17.1 % — ABNORMAL HIGH (ref 11.5–15.5)
WBC: 24.3 10*3/uL — ABNORMAL HIGH (ref 4.0–10.5)

## 2016-04-30 LAB — PROTIME-INR
INR: 5.44 — AB (ref 0.00–1.49)
Prothrombin Time: 47.9 seconds — ABNORMAL HIGH (ref 11.6–15.2)

## 2016-04-30 LAB — URINE MICROSCOPIC-ADD ON
RBC / HPF: NONE SEEN RBC/hpf (ref 0–5)
WBC UA: NONE SEEN WBC/hpf (ref 0–5)

## 2016-04-30 LAB — I-STAT CHEM 8, ED
BUN: 20 mg/dL (ref 6–20)
CHLORIDE: 101 mmol/L (ref 101–111)
Calcium, Ion: 1.1 mmol/L — ABNORMAL LOW (ref 1.12–1.23)
Creatinine, Ser: 1 mg/dL (ref 0.44–1.00)
Glucose, Bld: >700 mg/dL (ref 65–99)
HEMATOCRIT: 33 % — AB (ref 36.0–46.0)
Hemoglobin: 11.2 g/dL — ABNORMAL LOW (ref 12.0–15.0)
Potassium: 6.9 mmol/L (ref 3.5–5.1)
Sodium: 128 mmol/L — ABNORMAL LOW (ref 135–145)
TCO2: 6 mmol/L (ref 0–100)

## 2016-04-30 LAB — POCT INR: INR: 3.4

## 2016-04-30 LAB — TROPONIN I: Troponin I: <0.03 ng/mL (ref <0.031)

## 2016-04-30 LAB — CBG MONITORING, ED

## 2016-04-30 LAB — GLUCOSE, POCT (MANUAL RESULT ENTRY): POC Glucose: 542 mg/dl — AB (ref 70–99)

## 2016-04-30 MED ORDER — PROPOFOL 1000 MG/100ML IV EMUL
5.0000 ug/kg/min | Freq: Once | INTRAVENOUS | Status: AC
  Administered 2016-04-30: 5 ug/kg/min via INTRAVENOUS

## 2016-04-30 MED ORDER — FENTANYL CITRATE (PF) 100 MCG/2ML IJ SOLN: 25.0000 ug | INTRAMUSCULAR | Status: DC | PRN

## 2016-04-30 MED ORDER — INSULIN DETEMIR 100 UNIT/ML ~~LOC~~ SOLN: 30.0000 [IU] | Freq: Every day | SUBCUTANEOUS | Status: DC

## 2016-04-30 MED ORDER — SODIUM CHLORIDE 0.9 % IV SOLN
1.0000 g | Freq: Once | INTRAVENOUS | Status: AC
  Administered 2016-04-30: 1 g via INTRAVENOUS
  Filled 2016-04-30: qty 10

## 2016-04-30 MED ORDER — INSULIN ASPART 100 UNIT/ML FLEXPEN: 0.0000 [IU] | PEN_INJECTOR | Freq: Three times a day (TID) | SUBCUTANEOUS | Status: DC

## 2016-04-30 MED ORDER — SODIUM CHLORIDE 0.9 % IV BOLUS (SEPSIS)
1000.0000 mL | Freq: Once | INTRAVENOUS | Status: AC
  Administered 2016-04-30: 1000 mL via INTRAVENOUS

## 2016-04-30 MED ORDER — PROPOFOL 1000 MG/100ML IV EMUL
INTRAVENOUS | Status: AC
  Filled 2016-04-30: qty 100

## 2016-04-30 MED ORDER — INSULIN ASPART 100 UNIT/ML ~~LOC~~ SOLN
10.0000 [IU] | Freq: Once | SUBCUTANEOUS | Status: AC
  Administered 2016-04-30: 10 [IU] via SUBCUTANEOUS
  Filled 2016-04-30: qty 1

## 2016-04-30 MED ORDER — SODIUM CHLORIDE 0.9 % IV SOLN
INTRAVENOUS | Status: DC
  Filled 2016-04-30: qty 2.5

## 2016-04-30 MED ORDER — PIPERACILLIN-TAZOBACTAM 3.375 G IVPB 30 MIN
3.3750 g | Freq: Once | INTRAVENOUS | Status: AC
  Administered 2016-05-01: 3.375 g via INTRAVENOUS
  Filled 2016-04-30: qty 50

## 2016-04-30 MED ORDER — SODIUM BICARBONATE 8.4 % IV SOLN
50.0000 meq | Freq: Once | INTRAVENOUS | Status: AC
  Administered 2016-05-01: 50 meq via INTRAVENOUS

## 2016-04-30 MED ORDER — SODIUM CHLORIDE 0.9 % IV SOLN
Freq: Once | INTRAVENOUS | Status: AC
  Administered 2016-04-30: 23:00:00 via INTRAVENOUS

## 2016-04-30 MED ORDER — SODIUM CHLORIDE 0.9 % IV SOLN
INTRAVENOUS | Status: DC
  Administered 2016-05-01: 5.4 [IU]/h via INTRAVENOUS
  Filled 2016-04-30: qty 2.5

## 2016-04-30 MED ORDER — VANCOMYCIN HCL IN DEXTROSE 1-5 GM/200ML-% IV SOLN
1000.0000 mg | Freq: Once | INTRAVENOUS | Status: AC
  Administered 2016-05-01: 1000 mg via INTRAVENOUS
  Filled 2016-04-30: qty 200

## 2016-04-30 MED ORDER — NOREPINEPHRINE BITARTRATE 1 MG/ML IV SOLN
0.0000 ug/min | Freq: Once | INTRAVENOUS | Status: AC
  Administered 2016-04-30: 20 ug/min via INTRAVENOUS
  Filled 2016-04-30 (×2): qty 4

## 2016-04-30 MED ORDER — ROCURONIUM BROMIDE 50 MG/5ML IV SOLN
50.0000 mg | Freq: Once | INTRAVENOUS | Status: AC
  Administered 2016-04-30: 50 mg via INTRAVENOUS
  Filled 2016-04-30: qty 5

## 2016-04-30 MED ORDER — ETOMIDATE 2 MG/ML IV SOLN
16.0000 mg | Freq: Once | INTRAVENOUS | Status: AC
  Administered 2016-04-30: 16 mg via INTRAVENOUS

## 2016-04-30 NOTE — ED Notes (Signed)
BP too low for propofol, stopped. MD wickline aware

## 2016-04-30 NOTE — Progress Notes (Signed)
I agree with this plan.

## 2016-04-30 NOTE — Progress Notes (Signed)
Pt transported to/from CT without event. 

## 2016-04-30 NOTE — Progress Notes (Signed)
Subjective:    Patient ID: Madison HIGGINBOTHAM, female    DOB: 06/05/1961, 55 y.o.   MRN: [id number]  HPI  55 year old female who  has a past medical history of CAD (coronary artery disease); Obesity; Hypercholesteremia; HTN (hypertension); Dyslipidemia; Aortic stenosis; Thrombophlebitis; Heart murmur; Myocardial infarction ([place]) (12/2014); Pneumonia (11/2014; 12/2014); GERD (gastroesophageal reflux disease); Stroke syndrome ([place]) (1995; 2001); Anxiety; Depression; Allergy; CHF (congestive heart failure) ([place]); Rheumatoid arthritis(714.0); and Diabetes ([place]) (dx'd 1982).  presents to the office after being discharged from [facility] after being released from the hospital on  04/03/2016 s/p DKA - multiple times within the two months prior. I never received any documentation from her rehab facility.   She reports that at the facility her blood sugars continued to be elevated " in the 500's, and they didn't know what to do with me." she received PT in the facility and feels as though " I am doing a lot better." She is back to living independently but has a home health aid that is at the home during the day.   At home her blood sugars continue to be in the 500's. Her current regimen is that of 25 units Insulin Levimer and 5 units Novolog before meals.   She would like to see Dr. Buddy Duty with [facility] medicine for her diabetes rather than her current endocrinologist.   She reports that she is eating as she normally does. Denies any weakness, falls, hypoglycemia, or feeling acutely ill since being discharged from the facility.    Review of Systems  Constitutional: Negative.   Eyes: Negative.   Respiratory: Negative.   Cardiovascular: Negative.   Gastrointestinal: Negative.   Endocrine: Negative.   Genitourinary: Negative.   Musculoskeletal: Positive for arthralgias and gait problem.  Skin: Negative.   Allergic/Immunologic: Negative.   Neurological:  Negative.   Hematological: Negative.   Psychiatric/Behavioral: Negative.   All other systems reviewed and are negative.  Past Medical History  Diagnosis Date  . CAD (coronary artery disease)   . Obesity   . Hypercholesteremia   . HTN (hypertension)   . Dyslipidemia   . Aortic stenosis   . Thrombophlebitis   . Heart murmur   . Myocardial infarction ([place]) 12/2014  . Pneumonia 11/2014; 12/2014  . GERD (gastroesophageal reflux disease)   . Stroke syndrome [facility]) 1995; 2001    "when my son was born; problems w/speech and L hand since then" (01/19/2015)  . Anxiety   . Depression   . Allergy   . CHF (congestive heart failure) ([place])   . Rheumatoid arthritis(714.0)     "hands" (07/01/2015)  . Diabetes ([place]) dx'd 1982    "went straight to insulin"    Social History   Social History  . Marital Status: Divorced    Spouse Name: N/A  . Number of Children: N/A  . Years of Education: N/A   Occupational History  . Not on file.   Social History Main Topics  . Smoking status: Never Smoker   . Smokeless tobacco: Never Used  . Alcohol Use: No  . Drug Use: No  . Sexual Activity: No   Other Topics Concern  . Not on file   Social History Narrative   Divroced and lives at home.    Has two sons who live in [place] at this time. (21&8 y/o).    Has a cat    Walks a lot.       She is having  great success with her diet       Working with exercises that were given to her by PT/OT.     Past Surgical History  Procedure Laterality Date  . Cesarean section  1995  . Foot fracture surgery    . Knee arthroscopy Right   . Neuroplasty / transposition median nerve at carpal tunnel    . Left heart catheterization with coronary angiogram N/A 12/08/2014    Procedure: LEFT HEART CATHETERIZATION WITH CORONARY ANGIOGRAM;  Surgeon: Wellington Hampshire, MD;  Location: [facility] CATH LAB;  Service: Cardiovascular;  Laterality: N/A;  . Percutaneous coronary stent intervention (pci-s) N/A 12/09/2014    Procedure:  PERCUTANEOUS CORONARY STENT INTERVENTION (PCI-S);  Surgeon: Leonie Man, MD;  Location: [facility] CATH LAB;  Service: Cardiovascular;  Laterality: N/A;  . Cardiac catheterization  12/08/2014  . Coronary angioplasty with stent placement  12/09/2014  . Aortic valve replacement (avr)/coronary artery bypass grafting (cabg)  "2014"    Archie Endo 03/08/2014  . Fracture surgery    . Tubal ligation  1995  . Cataract extraction Left   . Coronary artery bypass graft    . Orif ankle fracture Left 07/04/2015    Procedure: OPEN REDUCTION INTERNAL FIXATION (ORIF)  TRIMAL ANKLE FRACTURE;  Surgeon: Leandrew Koyanagi, MD;  Location: [facility];  Service: Orthopedics;  Laterality: Left;    Family History  Problem Relation Age of Onset  . Stroke    . Heart disease Mother   . Heart disease Sister     Allergies  Allergen Reactions  . Adhesive [Tape] Other (See Comments)    Can burn skin if left on too long  . Celebrex [Celecoxib] Rash  . Detrol [Tolterodine] Hives    Current Outpatient Prescriptions on File Prior to Visit  Medication Sig Dispense Refill  . Blood Glucose Monitoring Suppl ([place]) w/Device KIT Use to check glucose 2x per day 1 each 0  . citalopram (CELEXA) 40 MG tablet Take 1 tablet (40 mg total) by mouth daily. 90 tablet 3  . glucose blood test strip Use as instructed (Patient taking differently: 1 each by Other route as needed (as instructed). Use as instructed) 100 each 12  . HYDROcodone-acetaminophen (NORCO/VICODIN) 5-325 MG tablet Take 1 tablet by mouth every 8 (eight) hours as needed for moderate pain. 21 tablet 0  . LEVEMIR 100 UNIT/ML injection INJECT 25 UNITS INTO THE SKIN DAILY 10 mL 0  . omeprazole (PRILOSEC) 40 MG capsule TAKE ONE CAPSULE BY MOUTH EVERY DAY 90 capsule 1  . pravastatin (PRAVACHOL) 20 MG tablet TAKE 1 TABLET BY MOUTH EVERY DAY 90 tablet 0  . sucralfate (CARAFATE) 1 GM/10ML suspension Take 10 mLs (1 g total) by mouth 4 (four) times daily -  with meals and at  bedtime. 420 mL 0  . traZODone (DESYREL) 50 MG tablet Take 50 mg by mouth at bedtime.   0  . warfarin (COUMADIN) 3 MG tablet Take as directed by anticoagulation clinic. 30 tablet 0   No current facility-administered medications on file prior to visit.    BP 130/60 mmHg  Pulse 125  Wt 120 lb (54.432 kg)  SpO2 98%       Objective:   Physical Exam  Constitutional: She appears well-developed and well-nourished. No distress.  Cardiovascular: Regular rhythm, normal heart sounds and intact distal pulses.  Tachycardia present.  Exam reveals no gallop and no friction rub.   No murmur heard. Pulmonary/Chest: Effort normal and breath sounds normal. No respiratory  distress. She has no wheezes. She has no rales. She exhibits no tenderness.  Abdominal: Soft. Bowel sounds are normal. She exhibits no distension and no mass. There is no tenderness. There is no rebound and no guarding.  Musculoskeletal: Normal range of motion. She exhibits no edema or tenderness.  Neurological: She is alert.  Skin: Skin is warm and dry. No rash noted. She is not diaphoretic. No erythema. No pallor.  Psychiatric: She has a normal mood and affect. Her behavior is normal. Judgment and thought content normal.  Nursing note and vitals reviewed.     Assessment & Plan:  1. Tachycardia - EKG 12-Lead- sinus Tachycardia, rate 115. Consistent with previous EKGs - Consider referral to cardiology    2. Uncontrolled type 1 diabetes mellitus with complication [facility]) - Had a long discussion with [place] on whether or not a facility would be a better option for her to live in. She adamantly declined to go back to facility  - Increased from 15 units to 30 units - insulin detemir (LEVEMIR) 100 UNIT/ML injection; Inject 0.3 mLs (30 Units total) into the skin daily.  Dispense: 10 mL; Refill: 6 - [place] Novalog - sliding scale - patient given sliding scale instructions - follow up in 2 weeks  - Ambulatory referral to  Endocrinology - CBC with Differential/Platelet - Basic metabolic panel - POCT glucose (manual entry)- 542 - She does not want to go to the ER  Dorothyann Peng, NP

## 2016-04-30 NOTE — H&P (Addendum)
PULMONARY / CRITICAL CARE MEDICINE   Name: Madison [name] MRN: [id number] DOB: [birth year]    ADMISSION DATE:  04/30/2016 CONSULTATION DATE:  05/01/2016  REFERRING MD:  Allyson Sabal, M.D. / EDP  CHIEF COMPLAINT:  DKA  HISTORY OF PRESENT ILLNESS:  Patient known to our service from previous hospitalization early last month with diabetic ketoacidosis. Per hospital record patient presented with altered mental status found down today by her roommate who is a home health aid. Patient was noted to have rapid shallow breathing as well as diabetic ketoacidosis. Patient was found unresponsive and posturing. Eyes were deviated to decide upon EMS arrival. After arrival in the emergency department she was intubated. Patient developed hypotension postintubation on propofol therefore propofol infusion was stopped. Upon further review the patient's electronic medical record during office visit on 6/12 she had reported her blood glucose continued to be in the 500s despite a regimen of Levemir 25 units and 5 units of NovoLog before meals. Patient had reported eating normally and denied any weakness, falls, "feeling acutely ill", or hypoglycemia. Patient was noted to have sinus tachycardia during that visit on EKG. Levemir was increased to 30 units and patient was referred to endocrinology. Patient declined to go to the emergency department at that time.  PAST MEDICAL HISTORY :  Past Medical History  Diagnosis Date  . CAD (coronary artery disease)   . Obesity   . Hypercholesteremia   . HTN (hypertension)   . Dyslipidemia   . Aortic stenosis   . Thrombophlebitis   . Heart murmur   . Myocardial infarction ([place]) 12/2014  . Pneumonia 11/2014; 12/2014  . GERD (gastroesophageal reflux disease)   . Stroke syndrome [facility]) 1995; 2001    "when my son was born; problems w/speech and L hand since then" (01/19/2015)  . Anxiety   . Depression   . Allergy   . CHF (congestive heart failure) ([place])   . Rheumatoid arthritis(714.0)      "hands" (07/01/2015)  . Diabetes ([place]) dx'd 1982    "went straight to insulin"    PAST SURGICAL HISTORY: Past Surgical History  Procedure Laterality Date  . Cesarean section  1995  . Foot fracture surgery    . Knee arthroscopy Right   . Neuroplasty / transposition median nerve at carpal tunnel    . Left heart catheterization with coronary angiogram N/A 12/08/2014    Procedure: LEFT HEART CATHETERIZATION WITH CORONARY ANGIOGRAM;  Surgeon: Wellington Hampshire, MD;  Location: [facility] CATH LAB;  Service: Cardiovascular;  Laterality: N/A;  . Percutaneous coronary stent intervention (pci-s) N/A 12/09/2014    Procedure: PERCUTANEOUS CORONARY STENT INTERVENTION (PCI-S);  Surgeon: Leonie Man, MD;  Location: [facility] C[facility] LAB;  Service: Cardiovascular;  Laterality: N/A;  . Cardiac catheterization  12/08/2014  . Coronary angioplasty with stent placement  12/09/2014  . Aortic valve replacement (avr)/coronary artery bypass grafting (cabg)  "2014"    Archie Endo 03/08/2014  . Fracture surgery    . Tubal ligation  1995  . Cataract extraction Left   . Coronary artery bypass graft    . Orif ankle fracture Left 07/04/2015    Procedure: OPEN REDUCTION INTERNAL FIXATION (ORIF)  TRIMAL ANKLE FRACTURE;  Surgeon: Leandrew Koyanagi, MD;  Location: [facility];  Service: Orthopedics;  Laterality: Left;     Allergies  Allergen Reactions  . Adhesive [Tape] Other (See Comments)    Can burn skin if left on too long  . Celebrex [Celecoxib] Rash  . Detrol [Tolterodine] Hives  No current facility-administered medications on file prior to encounter.   Current Outpatient Prescriptions on File Prior to Encounter  Medication Sig  . Blood Glucose Monitoring Suppl ([place]) w/Device KIT Use to check glucose 2x per day  . citalopram (CELEXA) 40 MG tablet Take 1 tablet (40 mg total) by mouth daily.  Marland Kitchen glucose blood test strip Use as instructed (Patient taking differently: 1 each by Other route as needed (as instructed).  Use as instructed)  . HYDROcodone-acetaminophen (NORCO/VICODIN) 5-325 MG tablet Take 1 tablet by mouth every 8 (eight) hours as needed for moderate pain.  Marland Kitchen insulin aspart (NOVOLOG FLEXPEN) 100 UNIT/ML FlexPen Inject 0-9 Units into the skin 3 (three) times daily with meals. CBG < 70: eat or drink something and recheck, CBG 70 - 120: 0 units CBG 121 - 150: 2 unit CBG 151 - 200: 3 units CBG 201 - 250: 5 units CBG 251 - 300:  6units CBG 301 - 350:  7units CBG 351 - 400: 10 units CBG > 400: call MD.  . insulin detemir (LEVEMIR) 100 UNIT/ML injection Inject 0.3 mLs (30 Units total) into the skin daily.  Marland Kitchen LEVEMIR 100 UNIT/ML injection INJECT 25 UNITS INTO THE SKIN DAILY  . omeprazole (PRILOSEC) 40 MG capsule TAKE ONE CAPSULE BY MOUTH EVERY DAY  . pravastatin (PRAVACHOL) 20 MG tablet TAKE 1 TABLET BY MOUTH EVERY DAY  . sucralfate (CARAFATE) 1 GM/10ML suspension Take 10 mLs (1 g total) by mouth 4 (four) times daily -  with meals and at bedtime.  . traZODone (DESYREL) 50 MG tablet Take 50 mg by mouth at bedtime.   Marland Kitchen warfarin (COUMADIN) 3 MG tablet Take as directed by anticoagulation clinic.    FAMILY HISTORY:  Family History  Problem Relation Age of Onset  . Stroke    . Heart disease Mother   . Heart disease Sister     SOCIAL HISTORY: Social History   Social History  . Marital Status: Divorced    Spouse Name: N/A  . Number of Children: N/A  . Years of Education: N/A   Social History Main Topics  . Smoking status: Never Smoker   . Smokeless tobacco: Never Used  . Alcohol Use: No  . Drug Use: No  . Sexual Activity: No   Other Topics Concern  . Not on file   Social History Narrative   Divroced and lives at home.    Has two sons who live in [place] at this time. (21&8 y/o).    Has a cat    Walks a lot.       She is having great success with her diet       Working with exercises that were given to her by PT/OT.     REVIEW OF SYSTEMS:  Unable to obtain given intubation and  acute encephalopathy.  SUBJECTIVE: As above.  VITAL SIGNS: BP 79/46 mmHg  Pulse 122  Resp 24  SpO2 100%  HEMODYNAMICS:    VENTILATOR SETTINGS: Vent Mode:  [-] PRVC FiO2 (%):  [50 %-100 %] 50 % Set Rate:  [24 bmp] 24 bmp Vt Set:  [410 mL] 410 mL PEEP:  [5 cmH20] 5 cmH20 Plateau Pressure:  [14 cmH20] 14 cmH20  INTAKE / OUTPUT:    PHYSICAL EXAMINATION: General:  No acute distress. No family at bedside. Nurse & resident present. Integument:  Cool & dry. No rash on exposed skin.  Lymphatics:  No appreciated cervical or supraclavicular lymphadenoapthy. HEENT:  Dry mucus membranes.  No scleral injection or icterus. Endotracheal tube in place.  Cardiovascular:  Tachycardic. No edema. No appreciable JVD.  Pulmonary:  Clear bilaterally to auscultation. Symmetric chest wall rise on ventilator. Abdomen: Soft. Normal bowel sounds. Nondistended.  Musculoskeletal:  Normal bulk and tone. No joint deformity or effusion appreciated. Neurological:  Status post paralytic administration approximately 45 minutes prior to exam. Pupils dilated and nonresponsive. No corneal blink. No cough or gag reflex. Patient is overbreathing ventilator. No withdrawal to pain in extremities. No Babinski reflex. Forward gaze. Psychiatric:  Unable to assess given intubation and sedation.  LABS:  BMET  Recent Labs Lab 04/30/16 2304  NA 128*  K 6.9*  CL 101  BUN 20  CREATININE 1.00  GLUCOSE >700*    Electrolytes No results for input(s): CALCIUM, MG, PHOS in the last 168 hours.  CBC  Recent Labs Lab 04/30/16 2250 04/30/16 2304  WBC 24.3*  --   HGB 9.0* 11.2*  HCT 31.4* 33.0*  PLT 425*  --     Coag's  Recent Labs Lab 04/30/16 04/30/16 2250  INR 3.4 5.44*    Sepsis Markers No results for input(s): LATICACIDVEN, PROCALCITON, O2SATVEN in the last 168 hours.  ABG  Recent Labs Lab 04/30/16 2254  PHART 6.828*  PCO2ART 22.9*  PO2ART 618.0*    Liver Enzymes No results for input(s):  AST, ALT, ALKPHOS, BILITOT, ALBUMIN in the last 168 hours.  Cardiac Enzymes  Recent Labs Lab 04/30/16 2250  TROPONINI <0.03    Glucose  Recent Labs Lab 04/30/16 2236  GLUCAP >600*    Imaging Ct Head Wo Contrast  04/30/2016  CLINICAL DATA:  Acute onset of shortness of breath. Initial encounter. EXAM: CT HEAD WITHOUT CONTRAST TECHNIQUE: Contiguous axial images were obtained from the base of the skull through the vertex without intravenous contrast. COMPARISON:  CT of the head performed 02/26/2016 FINDINGS: There is no evidence of acute infarction, mass lesion, or intra- or extra-axial hemorrhage on CT. Prominence of the ventricles and sulci reflects mild to moderate cortical volume loss. 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 Diffuse periventricular and subcortical white matter change likely reflects small vessel ischemic microangiopathy. Small chronic infarcts are noted at the cerebellar hemispheres bilaterally, more prominent on the left. The brainstem and fourth ventricle are within normal limits. The basal ganglia are unremarkable in appearance. No mass effect or midline shift is seen. There is no evidence of fracture; visualized osseous structures are unremarkable in appearance. The orbits are within normal limits. The paranasal sinuses and mastoid air cells are well-aerated. No significant soft tissue abnormalities are seen. IMPRESSION: 1. No acute intracranial pathology seen on CT. 2. Mild to moderate cortical volume loss and diffuse small vessel ischemic microangiopathy. 3. 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 4. Small chronic infarcts at the cerebellar hemispheres bilaterally, more prominent on the left. Electronically Signed   By: Garald Balding  M.D.   On: 04/30/2016 23:43   Dg Chest Portable 1 View  04/30/2016  CLINICAL DATA:  Unresponsive patient. Endotracheal tube and enteric tubes were placed. Shortness of breath. EXAM: PORTABLE CHEST 1 VIEW COMPARISON:  03/26/2016 FINDINGS: Postoperative changes in the mediastinum. An endotracheal tube has been placed with tip measuring 3 cm above the carina. An enteric tube has been placed with tip in the left upper quadrant  consistent with location in the upper stomach. Normal heart size and pulmonary vascularity. No focal airspace disease or consolidation in the lungs. No blunting of costophrenic angles. No pneumothorax. Mediastinal contours appear intact. IMPRESSION: Appliances appear to be in satisfactory position. No evidence of active pulmonary disease. Electronically Signed   By: Lucienne Capers M.D.   On: 04/30/2016 23:15    STUDIES:  CT HEAD W/O 6/12: Mild to moderate cortical volume loss. Large chronic infarcts noted in right frontal, parietal, and occipital lobes. Large chronic infarcts also noted at the left parietal and occipital lobes with associated encephalomalacia. Diffuse periventricular & subcortical white matter change reflecting small vessel ischemic microangiopathy. Also noted are small chronic infarcts in cerebellar hemispheres bilaterally more prominent on the left. No mass effect or midline shift. No evidence of fracture.  PORT CXR 6/12: No focal opacity or effusion. Endotracheal tube in acceptable position. Enteric tube with tip below the diaphragm.  MICROBIOLOGY: MRSA PCR 6/13 >> Blood Ctx x2 6/13 >> Tracheal Asp Ctx 6/13 >> Urine Ctx 6/12 >>  ANTIBIOTICS: Vancomycin 6/13 >> Zosyn 6/13 >>  SIGNIFICANT EVENTS: 4/29 - 5/02 - Admit w/ DKA 5/08 - 5/16 - Admit w/ DKA 6/13 - Admit with DKA  LINES/TUBES: OETT 7.0 6/12 >> R Femoral CVL 6/13 (EDP) >> Foley 6/12 >> OGT 6/12 >> R & L EJ IV 6/12 >>  DISCUSSION:  55 year old female with known history of type 1 diabetes  mellitus as well as aortic valve replacement on chronic systemic anticoagulation with Coumadin. Patient has had multiple repeated admissions for diabetic ketoacidosis. Presents after being found down today for an unknown period of time but after her primary care physician appointment today. Patient has multisystem organ failure with acute encephalopathy, shock, acute renal failure, & diabetic ketoacidosis. I had a lengthy discussion with the patient's sister via phone. Reportedly the patient has a son in [place] that she will attempt to contact. At this time the patient's sister reports she would not wish to undergo resuscitation in the event of cardiac arrest or arrhythmia. I asked this question of her twice to ensure that I was clear on her response. We will continue treatment of her DKA and correction of her electrolyte abnormalities while investigating the potential cause for such an acute decompensation.  ASSESSMENT / PLAN:  ENDOCRINE A:   DKA - Just admitted & discharged w/ DKA in May. H/O DM Type I - Poorly controlled.   P:   Holding home Levemir Checking beta hydroxybutyrate Insulin drip per DKA protocol  PULMONARY A: Acute Respiratory Failure - Secondary to altered mental status.  P:   Full Vent Support SBT & WUA as mental status allows  CARDIOVASCULAR A:  Shock - Likely hypovolemia versus sepsis. S/P Aortic Valve Replacement Chronic Systolic CHF H/O CAD - S/P CABG. H/O Hyperlipidemia  P:  Monitor on telemetry Vitals per unit protocol Holding Coumadin Heparin drip per pharmacy protocol once INR <2.0 Holding home Pravachol Neo-Synephrine to maintain MAP >65 & SBP >90 Trending troponin I every 6 hours Checking complete echocardiogram  RENAL A:   Hyperkalemia - Likely secondary to metabolic acidosis. S/P IV Calcium. Metabolic Acidosis Acute Renal Failure - Likely due to dehydration. Pseudo-hyponatremia  P:   Trending electrolytes per DKA protocol Monitoring UOP  with Foley Replacing electrolytes as indicated Bicarb drip 150cc/hr Trending Lactic Acid q6hr Stat CK  GASTROINTESTINAL A:   No acute issues. H/O GERD  P:   NPO Pepcid IV daily Checking LFTs  HEMATOLOGIC A:  Anemia - No obvious signs of active bleeding. Slightly worse than baseline. Leukocytosis  Coagulopathy  - Secondary to Coumadin.  P:  Stat Type & Screen. Trending cell count daily w/ CBC Daily Coags Transfuse per ICU guidelines  INFECTIOUS A:   SIRS  P:   Empiric Vancomycin & Zosyn Day #1 PCT per algorithm Awaiting Blood, Urine, & Tracheal Aspirate Cultures  NEUROLOGIC A:   Acute Encephalopathy - Likely metabolic. Gaze Deviation - No reported witnessed seizures. 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 H/O Depression/Anxiety Sedation on Ventilator.  P:   RASS goal: 0 to -1 Versed IV prn Fentanyl IV prn Holding home Celexa & Trazodone. Stat EEG Checking UDS Neuro Checks q2hr Seizure Precautions  FAMILY  - Updates: No family at bedside 6/13. Sister [name] updated via phone by Dr. Ashok Cordia 6/13.  - Inter-disciplinary family meet or Palliative Care meeting due by:  6/20  I have spent a total of 51 minutes of critical care time today caring for the patient, discussing the plan of care as well as updating the patient's sister via phone, and reviewing the patient's electronic medical record.  Sonia Baller Ashok Cordia, M.D. [facility] Pulmonary & Critical Care Pager:  [phone] After 3pm or if no response, call [phone] 12:00 AM 05/01/2016

## 2016-04-30 NOTE — ED Provider Notes (Signed)
CSN: [id number]     Arrival date & time 04/30/16  2231 History   First MD Initiated Contact with Patient 04/30/16 2233     Chief Complaint  Patient presents with  . Diabetic Ketoacidosis   Patient is a 55 y.o. female presenting with altered mental status.  Altered Mental Status Presenting symptoms: unresponsiveness   Severity:  Severe Most recent episode:  Today Episode history:  Single Timing:  Constant Progression:  Worsening Chronicity:  New Context comment:  High sugars Associated symptoms: difficulty breathing    Pt found by home health aid unresponsive, posturing. Kussmal respirations, pupils dilated and fixed. Hx DKA 1200 glucoses. Cardiac hx. Eyes deviated to the side upon EMS arrival.   Past Medical History  Diagnosis Date  . CAD (coronary artery disease)   . Obesity   . Hypercholesteremia   . HTN (hypertension)   . Dyslipidemia   . Aortic stenosis   . Thrombophlebitis   . Heart murmur   . Myocardial infarction ([place]) 12/2014  . Pneumonia 11/2014; 12/2014  . GERD (gastroesophageal reflux disease)   . Stroke syndrome [facility]) 1995; 2001    "when my son was born; problems w/speech and L hand since then" (01/19/2015)  . Anxiety   . Depression   . Allergy   . CHF (congestive heart failure) ([place])   . Rheumatoid arthritis(714.0)     "hands" (07/01/2015)  . Diabetes ([place]) dx'd 1982    "went straight to insulin"   Past Surgical History  Procedure Laterality Date  . Cesarean section  1995  . Foot fracture surgery    . Knee arthroscopy Right   . Neuroplasty / transposition median nerve at carpal tunnel    . Left heart catheterization with coronary angiogram N/A 12/08/2014    Procedure: LEFT HEART CATHETERIZATION WITH CORONARY ANGIOGRAM;  Surgeon: Wellington Hampshire, MD;  Location: [facility] CATH LAB;  Service: Cardiovascular;  Laterality: N/A;  . Percutaneous coronary stent intervention (pci-s) N/A 12/09/2014    Procedure: PERCUTANEOUS CORONARY STENT INTERVENTION (PCI-S);  Surgeon:  Leonie Man, MD;  Location: [facility] CATH LAB;  Service: Cardiovascular;  Laterality: N/A;  . Cardiac catheterization  12/08/2014  . Coronary angioplasty with stent placement  12/09/2014  . Aortic valve replacement (avr)/coronary artery bypass grafting (cabg)  "2014"    Archie Endo 03/08/2014  . Fracture surgery    . Tubal ligation  1995  . Cataract extraction Left   . Coronary artery bypass graft    . Orif ankle fracture Left 07/04/2015    Procedure: OPEN REDUCTION INTERNAL FIXATION (ORIF)  TRIMAL ANKLE FRACTURE;  Surgeon: Leandrew Koyanagi, MD;  Location: [facility];  Service: Orthopedics;  Laterality: Left;   Family History  Problem Relation Age of Onset  . Stroke    . Heart disease Mother   . Heart disease Sister    Social History  Substance Use Topics  . Smoking status: Never Smoker   . Smokeless tobacco: Never Used  . Alcohol Use: No   OB History    No data available     Review of Systems  Unable to perform ROS: Mental status change      Allergies  Adhesive; Celebrex; and Detrol  Home Medications   Prior to Admission medications   Medication Sig Start Date End Date Taking? Authorizing Provider  Blood Glucose Monitoring Suppl ([place]) w/Device KIT Use to check glucose 2x per day 03/16/16   Dorothyann Peng, NP  citalopram (CELEXA) 40 MG tablet Take 1 tablet (  40 mg total) by mouth daily. 03/08/16   Dorothyann Peng, NP  glucose blood test strip Use as instructed Patient taking differently: 1 each by Other route as needed (as instructed). Use as instructed 03/16/16   Dorothyann Peng, NP  HYDROcodone-acetaminophen (NORCO/VICODIN) 5-325 MG tablet Take 1 tablet by mouth every 8 (eight) hours as needed for moderate pain. 04/24/16 05/01/16  Betty G Martinique, MD  insulin aspart (NOVOLOG FLEXPEN) 100 UNIT/ML FlexPen Inject 0-9 Units into the skin 3 (three) times daily with meals. CBG < 70: eat or drink something and recheck, CBG 70 - 120: 0 units CBG 121 - 150: 2 unit CBG 151 - 200: 3  units CBG 201 - 250: 5 units CBG 251 - 300:  6units CBG 301 - 350:  7units CBG 351 - 400: 10 units CBG > 400: call MD. 04/30/16   Dorothyann Peng, NP  insulin detemir (LEVEMIR) 100 UNIT/ML injection Inject 0.3 mLs (30 Units total) into the skin daily. 04/30/16   Dorothyann Peng, NP  LEVEMIR 100 UNIT/ML injection INJECT 25 UNITS INTO THE SKIN DAILY 04/30/16   Dorothyann Peng, NP  omeprazole (PRILOSEC) 40 MG capsule TAKE ONE CAPSULE BY MOUTH EVERY DAY 03/20/16   Dorothyann Peng, NP  pravastatin (PRAVACHOL) 20 MG tablet TAKE 1 TABLET BY MOUTH EVERY DAY 03/08/16   Dorothyann Peng, NP  sucralfate (CARAFATE) 1 GM/10ML suspension Take 10 mLs (1 g total) by mouth 4 (four) times daily -  with meals and at bedtime. 04/02/16   Belkys A Regalado, MD  traZODone (DESYREL) 50 MG tablet Take 50 mg by mouth at bedtime.  12/17/15   Historical Provider, MD  warfarin (COUMADIN) 3 MG tablet Take as directed by anticoagulation clinic. 04/23/16   Dorothyann Peng, NP   BP 149/74 mmHg  Pulse 132  Temp(Src) 94.3 F (34.6 C)  Resp 24  SpO2 100% Physical Exam  Constitutional: She appears well-developed and well-nourished. No distress.  HENT:  Head: Normocephalic and atraumatic.  Eyes: Right eye exhibits no discharge. Left eye exhibits no discharge.  Fixed, dilated  Neck: Normal range of motion. Neck supple.  Cardiovascular: Normal rate and regular rhythm.   Pulmonary/Chest: Breath sounds normal. She is in respiratory distress. She has no rales.  tachypnic  Abdominal: Soft. Bowel sounds are normal. She exhibits no distension and no mass.  Musculoskeletal: She exhibits no edema.  Neurological:  unarousable Flexion to pain  Skin: Skin is warm. No rash noted.  Nursing note and vitals reviewed.   ED Course  .Intubation Date/Time: 04/30/2016 10:52 PM Performed by: Karma Greaser Authorized by: Karma Greaser Consent: The procedure was performed in an emergent situation. Patient identity confirmed: arm band Time  out: Immediately prior to procedure a "time out" was called to verify the correct patient, procedure, equipment, support staff and site/side marked as required. Indications: respiratory distress and  airway protection Intubation method: direct Patient status: paralyzed (RSI) Sedatives: etomidate Paralytic: rocuronium Laryngoscope size: Miller 3 Tube size: 7.5 mm Tube type: cuffed Number of attempts: 1 Ventilation between attempts: BVM Cricoid pressure: no Cords visualized: yes Post-procedure assessment: chest rise and ETCO2 monitor Breath sounds: equal Cuff inflated: yes ETT to lip: 22 cm Tube secured with: ETT holder and adhesive tape Chest x-ray interpreted by me. Chest x-ray findings: endotracheal tube in appropriate position Comments: Patient became slightly hypotensive after procedure. Fluids started.   Linus Salmons Line Date/Time: 05/01/2016 12:44 AM Performed by: Karma Greaser Authorized by: Karma Greaser Consent: The procedure was performed  in an emergent situation. Patient identity confirmed: arm band Time out: Immediately prior to procedure a "time out" was called to verify the correct patient, procedure, equipment, support staff and site/side marked as required. Indications: vascular access Patient sedated: no Preparation: skin prepped with 2% chlorhexidine and skin prepped with Betadine Skin prep agent dried: skin prep agent completely dried prior to procedure Sterile barriers: all five maximum sterile barriers used - cap, mask, sterile gown, sterile gloves, and large sterile sheet Hand hygiene: hand hygiene performed prior to central venous catheter insertion Location details: right femoral Site selection rationale: INR very high, bilateral EJs in neck Patient position: flat Catheter type: triple lumen Catheter size: 7.5 Fr Pre-procedure: landmarks identified Ultrasound guidance: yes Sterile ultrasound techniques: sterile gel and sterile probe covers  were used Number of attempts: 1 Successful placement: yes Post-procedure: line sutured and dressing applied Assessment: blood return through all ports Patient tolerance: Patient tolerated the procedure well with no immediate complications   (including critical care time) Labs Review Labs Reviewed  CBC WITH DIFFERENTIAL/PLATELET - Abnormal; Notable for the following:    WBC 24.3 (*)    RBC 3.30 (*)    Hemoglobin 9.0 (*)    HCT 31.4 (*)    MCHC 28.7 (*)    RDW 17.1 (*)    Platelets 425 (*)    Neutro Abs 18.2 (*)    Lymphs Abs 4.9 (*)    Monocytes Absolute 1.2 (*)    All other components within normal limits  PROTIME-INR - Abnormal; Notable for the following:    Prothrombin Time 47.9 (*)    INR 5.44 (*)    All other components within normal limits  URINALYSIS, ROUTINE W REFLEX MICROSCOPIC (NOT AT [facility]) - Abnormal; Notable for the following:    Glucose, UA >1000 (*)    Ketones, ur >80 (*)    All other components within normal limits  BASIC METABOLIC PANEL - Abnormal; Notable for the following:    Sodium 129 (*)    Potassium 7.1 (*)    Chloride 96 (*)    CO2 <7 (*)    Glucose, Bld 1029 (*)    Creatinine, Ser 1.85 (*)    Calcium 8.2 (*)    GFR calc non Af Amer 30 (*)    GFR calc Af Amer 34 (*)    All other components within normal limits  URINE MICROSCOPIC-ADD ON - Abnormal; Notable for the following:    Squamous Epithelial / LPF 0-5 (*)    Bacteria, UA RARE (*)    Casts HYALINE CASTS (*)    All other components within normal limits  CBG MONITORING, ED - Abnormal; Notable for the following:    Glucose-Capillary >600 (*)    All other components within normal limits  I-STAT ARTERIAL BLOOD GAS, ED - Abnormal; Notable for the following:    pH, Arterial 6.828 (*)    pCO2 arterial 22.9 (*)    pO2, Arterial 618.0 (*)    Bicarbonate 3.8 (*)    Acid-base deficit 29.0 (*)    All other components within normal limits  I-STAT CHEM 8, ED - Abnormal; Notable for the following:     Sodium 128 (*)    Potassium 6.9 (*)    Glucose, Bld >700 (*)    Calcium, Ion 1.10 (*)    Hemoglobin 11.2 (*)    HCT 33.0 (*)    All other components within normal limits  CBG MONITORING, ED - Abnormal; Notable for the following:  Glucose-Capillary >600 (*)    All other components within normal limits  URINE CULTURE  CULTURE, BLOOD (ROUTINE X 2)  CULTURE, BLOOD (ROUTINE X 2)  TROPONIN I  AMMONIA  I-STAT ARTERIAL BLOOD GAS, ED  CBG MONITORING, ED    Imaging Review Ct Head Wo Contrast  04/30/2016  CLINICAL DATA:  Acute onset of shortness of breath. Initial encounter. EXAM: CT HEAD WITHOUT CONTRAST TECHNIQUE: Contiguous axial images were obtained from the base of the skull through the vertex without intravenous contrast. COMPARISON:  CT of the head performed 02/26/2016 FINDINGS: There is no evidence of acute infarction, mass lesion, or intra- or extra-axial hemorrhage on CT. Prominence of the ventricles and sulci reflects mild to moderate cortical volume loss. 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 Diffuse periventricular and subcortical white matter change likely reflects small vessel ischemic microangiopathy. Small chronic infarcts are noted at the cerebellar hemispheres bilaterally, more prominent on the left. The brainstem and fourth ventricle are within normal limits. The basal ganglia are unremarkable in appearance. No mass effect or midline shift is seen. There is no evidence of fracture; visualized osseous structures are unremarkable in appearance. The orbits are within normal limits. The paranasal sinuses and mastoid air cells are well-aerated. No significant soft tissue abnormalities are seen. IMPRESSION: 1. No acute intracranial pathology seen on CT. 2. Mild to moderate cortical volume loss and diffuse small vessel ischemic microangiopathy. 3. Large chronic  infarcts at the right frontal, parietal and occipital lobes, and at the left parietal and occipital lobes, with associated encephalomalacia. 4. Small chronic infarcts at the cerebellar hemispheres bilaterally, more prominent on the left. Electronically Signed   By: Garald Balding M.D.   On: 04/30/2016 23:43   Dg Chest Portable 1 View  04/30/2016  CLINICAL DATA:  Unresponsive patient. Endotracheal tube and enteric tubes were placed. Shortness of breath. EXAM: PORTABLE CHEST 1 VIEW COMPARISON:  03/26/2016 FINDINGS: Postoperative changes in the mediastinum. An endotracheal tube has been placed with tip measuring 3 cm above the carina. An enteric tube has been placed with tip in the left upper quadrant consistent with location in the upper stomach. Normal heart size and pulmonary vascularity. No focal airspace disease or consolidation in the lungs. No blunting of costophrenic angles. No pneumothorax. Mediastinal contours appear intact. IMPRESSION: Appliances appear to be in satisfactory position. No evidence of active pulmonary disease. Electronically Signed   By: Lucienne Capers M.D.   On: 04/30/2016 23:15   I have personally reviewed and evaluated these images and lab results as part of my medical decision-making.   EKG Interpretation   Date/Time:  Monday April 30 2016 22:41:37 EDT Ventricular Rate:  126 PR Interval:  88 QRS Duration: 108 QT Interval:  395 QTC Calculation: 572 R Axis:   -111 Text Interpretation:  Sinus or ectopic atrial tachycardia Left atrial  enlargement Incomplete RBBB and LAFB Borderline low voltage, extremity  leads RSR' in V1 or V2, right VCD or RVH Prolonged QT interval Abnormal  ekg Confirmed by Christy Gentles  MD, Elenore Rota ([id number]) on 04/30/2016 10:59:52 PM      MDM   Final diagnoses:  Diabetic ketoacidosis with coma associated with other specified diabetes mellitus ([place])  Acute respiratory failure, unspecified whether with hypoxia or hypercapnia ([place])    Patient presents  in DKA. Significantly obtunded, ACS of 6, intubated upon arrival. Subsequently patient became hypotensive, aggressive fluid resuscitation began but  despite 2 L continued hypotensive. Levophed started peripherally and central line placed subsequently. Patient's hypothermic, with a white count, broad-spectrum antibiotics started. Her QRS was slightly prolonged with peaked T waves and calcium gluconate and insulin administered prior to labs. Subsequent laboratory evaluation revealed significant hyperkalemia, acidosis. Insulin drip continued and sodium bicarbonate started. Intensivist at the bedside and will admit for further management of DKA and likely infectious trigger. Of note, CT head without acute brain bleed, chest x-ray without obvious pneumonia. Pending results of urinalysis.  Karma Greaser, MD 05/01/16 2761  Ripley Fraise, MD 05/01/16 [phone]

## 2016-04-30 NOTE — Patient Instructions (Signed)
Madison [name],   It was great seeing you again.   I am glad you are feeling better.   I have sent ina referral to Dr. Sharl Ma with [facility]  Go up to 30 units on the Levimer   Follow the sliding scale instructions for the Novolog   CBG < 70: eat or drink something and recheck, CBG 70 - 120: 0 units CBG 121 - 150: 2 unit CBG 151 - 200: 3 units CBG 201 - 250: 5 units CBG 251 - 300:  6units CBG 301 - 350:  7units CBG 351 - 400: 10 units CBG > 400: call MD.

## 2016-04-30 NOTE — ED Notes (Signed)
Pt found by home health aid unresponsive, posturing. Kussmal respirations, pupils dilated and fixed. Hx DKA 1200 glucoses. Cardiac hx. GSC ~6-7.

## 2016-04-30 NOTE — Progress Notes (Signed)
Pre visit review using our clinic review tool, if applicable. No additional management support is needed unless otherwise documented below in the visit note. 

## 2016-05-01 ENCOUNTER — Other Ambulatory Visit: Payer: Self-pay

## 2016-05-01 ENCOUNTER — Inpatient Hospital Stay (HOSPITAL_COMMUNITY): Payer: Commercial Managed Care - HMO

## 2016-05-01 DIAGNOSIS — IMO0002 Reserved for concepts with insufficient information to code with codable children: Secondary | ICD-10-CM | POA: Diagnosis present

## 2016-05-01 DIAGNOSIS — J9601 Acute respiratory failure with hypoxia: Secondary | ICD-10-CM | POA: Diagnosis present

## 2016-05-01 DIAGNOSIS — G9340 Encephalopathy, unspecified: Secondary | ICD-10-CM

## 2016-05-01 LAB — BASIC METABOLIC PANEL
ANION GAP: 11 (ref 5–15)
ANION GAP: 17 — AB (ref 5–15)
ANION GAP: 24 — AB (ref 5–15)
Anion gap: 23 — ABNORMAL HIGH (ref 5–15)
Anion gap: 21 — ABNORMAL HIGH (ref 5–15)
Anion gap: 11 (ref 5–15)
BUN: 18 mg/dL (ref 6–20)
BUN: 16 mg/dL (ref 6–20)
BUN: 13 mg/dL (ref 6–20)
BUN: 11 mg/dL (ref 6–20)
BUN: 10 mg/dL (ref 6–20)
BUN: 10 mg/dL (ref 6–20)
BUN: 10 mg/dL (ref 6–20)
CALCIUM: 7.9 mg/dL — AB (ref 8.9–10.3)
CALCIUM: 8.2 mg/dL — AB (ref 8.9–10.3)
CALCIUM: 8.4 mg/dL — AB (ref 8.9–10.3)
CHLORIDE: 96 mmol/L — AB (ref 101–111)
CHLORIDE: 108 mmol/L (ref 101–111)
CHLORIDE: 100 mmol/L — AB (ref 101–111)
CHLORIDE: 98 mmol/L — AB (ref 101–111)
CO2: <7 mmol/L — ABNORMAL LOW (ref 22–32)
CO2: 7 mmol/L — ABNORMAL LOW (ref 22–32)
CO2: 9 mmol/L — ABNORMAL LOW (ref 22–32)
CO2: 20 mmol/L — ABNORMAL LOW (ref 22–32)
CO2: 19 mmol/L — AB (ref 22–32)
CO2: 17 mmol/L — AB (ref 22–32)
CO2: 12 mmol/L — AB (ref 22–32)
Calcium: 8.2 mg/dL — ABNORMAL LOW (ref 8.9–10.3)
Calcium: 8.3 mg/dL — ABNORMAL LOW (ref 8.9–10.3)
Calcium: 7.5 mg/dL — ABNORMAL LOW (ref 8.9–10.3)
Calcium: 7.4 mg/dL — ABNORMAL LOW (ref 8.9–10.3)
Chloride: 103 mmol/L (ref 101–111)
Chloride: 106 mmol/L (ref 101–111)
Chloride: 108 mmol/L (ref 101–111)
Creatinine, Ser: 1.85 mg/dL — ABNORMAL HIGH (ref 0.44–1.00)
Creatinine, Ser: 1.8 mg/dL — ABNORMAL HIGH (ref 0.44–1.00)
Creatinine, Ser: 1.67 mg/dL — ABNORMAL HIGH (ref 0.44–1.00)
Creatinine, Ser: 1.15 mg/dL — ABNORMAL HIGH (ref 0.44–1.00)
Creatinine, Ser: 0.94 mg/dL (ref 0.44–1.00)
Creatinine, Ser: 1.12 mg/dL — ABNORMAL HIGH (ref 0.44–1.00)
Creatinine, Ser: 1.47 mg/dL — ABNORMAL HIGH (ref 0.44–1.00)
GFR calc Af Amer: 35 mL/min — ABNORMAL LOW (ref >60)
GFR calc Af Amer: 39 mL/min — ABNORMAL LOW (ref >60)
GFR calc Af Amer: >60 mL/min (ref >60)
GFR calc Af Amer: >60 mL/min (ref >60)
GFR calc non Af Amer: 31 mL/min — ABNORMAL LOW (ref >60)
GFR calc non Af Amer: >60 mL/min (ref >60)
GFR calc non Af Amer: 54 mL/min — ABNORMAL LOW (ref >60)
GFR calc non Af Amer: 39 mL/min — ABNORMAL LOW (ref >60)
GFR, EST AFRICAN AMERICAN: 34 mL/min — AB (ref >60)
GFR, EST AFRICAN AMERICAN: 45 mL/min — AB (ref >60)
GFR, EST NON AFRICAN AMERICAN: 30 mL/min — AB (ref >60)
GFR, EST NON AFRICAN AMERICAN: 33 mL/min — AB (ref >60)
GFR, EST NON AFRICAN AMERICAN: 53 mL/min — AB (ref >60)
GLUCOSE: 832 mg/dL — AB (ref 65–99)
GLUCOSE: 496 mg/dL — AB (ref 65–99)
GLUCOSE: 209 mg/dL — AB (ref 65–99)
Glucose, Bld: 1029 mg/dL (ref 65–99)
Glucose, Bld: 119 mg/dL — ABNORMAL HIGH (ref 65–99)
Glucose, Bld: 364 mg/dL — ABNORMAL HIGH (ref 65–99)
Glucose, Bld: 553 mg/dL (ref 65–99)
POTASSIUM: 7.1 mmol/L — AB (ref 3.5–5.1)
POTASSIUM: 3.9 mmol/L (ref 3.5–5.1)
POTASSIUM: 4 mmol/L (ref 3.5–5.1)
Potassium: 5.3 mmol/L — ABNORMAL HIGH (ref 3.5–5.1)
Potassium: 3.3 mmol/L — ABNORMAL LOW (ref 3.5–5.1)
Potassium: 3.5 mmol/L (ref 3.5–5.1)
Potassium: 4.3 mmol/L (ref 3.5–5.1)
SODIUM: 129 mmol/L — AB (ref 135–145)
Sodium: 133 mmol/L — ABNORMAL LOW (ref 135–145)
Sodium: 136 mmol/L (ref 135–145)
Sodium: 139 mmol/L (ref 135–145)
Sodium: 138 mmol/L (ref 135–145)
Sodium: 134 mmol/L — ABNORMAL LOW (ref 135–145)
Sodium: 134 mmol/L — ABNORMAL LOW (ref 135–145)

## 2016-05-01 LAB — RAPID URINE DRUG SCREEN, HOSP PERFORMED
Amphetamines: NOT DETECTED
BARBITURATES: NOT DETECTED
BENZODIAZEPINES: NOT DETECTED
COCAINE: NOT DETECTED
OPIATES: NOT DETECTED
Tetrahydrocannabinol: NOT DETECTED

## 2016-05-01 LAB — CK: CK TOTAL: 23 U/L — AB (ref 38–234)

## 2016-05-01 LAB — GLUCOSE, CAPILLARY
GLUCOSE-CAPILLARY: 564 mg/dL — AB (ref 65–99)
GLUCOSE-CAPILLARY: 240 mg/dL — AB (ref 65–99)
GLUCOSE-CAPILLARY: 212 mg/dL — AB (ref 65–99)
GLUCOSE-CAPILLARY: 161 mg/dL — AB (ref 65–99)
GLUCOSE-CAPILLARY: 135 mg/dL — AB (ref 65–99)
GLUCOSE-CAPILLARY: 121 mg/dL — AB (ref 65–99)
Glucose-Capillary: >600 mg/dL (ref 65–99)
Glucose-Capillary: 483 mg/dL — ABNORMAL HIGH (ref 65–99)
Glucose-Capillary: 394 mg/dL — ABNORMAL HIGH (ref 65–99)
Glucose-Capillary: 315 mg/dL — ABNORMAL HIGH (ref 65–99)
Glucose-Capillary: 140 mg/dL — ABNORMAL HIGH (ref 65–99)
Glucose-Capillary: 136 mg/dL — ABNORMAL HIGH (ref 65–99)
Glucose-Capillary: 388 mg/dL — ABNORMAL HIGH (ref 65–99)
Glucose-Capillary: 546 mg/dL — ABNORMAL HIGH (ref 65–99)

## 2016-05-01 LAB — POCT I-STAT 3, ART BLOOD GAS (G3+)
Acid-Base Excess: 2 mmol/L (ref 0.0–2.0)
Bicarbonate: 23.3 mEq/L (ref 20.0–24.0)
O2 Saturation: 100 %
PCO2 ART: 26.9 mmHg — AB (ref 35.0–45.0)
PH ART: 7.549 — AB (ref 7.350–7.450)
Patient temperature: 38.2
TCO2: 24 mmol/L (ref 0–100)
pO2, Arterial: 215 mmHg — ABNORMAL HIGH (ref 80.0–100.0)

## 2016-05-01 LAB — CBC WITH DIFFERENTIAL/PLATELET
BASOS ABS: 0 10*3/uL (ref 0.0–0.1)
Basophils Absolute: 0 10*3/uL (ref 0.0–0.1)
Basophils Relative: 0 %
Basophils Relative: 0 %
EOS ABS: 0 10*3/uL (ref 0.0–0.7)
Eosinophils Absolute: 0 10*3/uL (ref 0.0–0.7)
Eosinophils Relative: 0 %
Eosinophils Relative: 0 %
HCT: 30.1 % — ABNORMAL LOW (ref 36.0–46.0)
HEMATOCRIT: 26.7 % — AB (ref 36.0–46.0)
HEMOGLOBIN: 8.7 g/dL — AB (ref 12.0–15.0)
Hemoglobin: 9.3 g/dL — ABNORMAL LOW (ref 12.0–15.0)
LYMPHS ABS: 1.8 10*3/uL (ref 0.7–4.0)
LYMPHS PCT: 8 %
Lymphocytes Relative: 12 %
Lymphs Abs: 2.2 10*3/uL (ref 0.7–4.0)
MCH: 27.3 pg (ref 26.0–34.0)
MCH: 27 pg (ref 26.0–34.0)
MCHC: 30.9 g/dL (ref 30.0–36.0)
MCHC: 32.6 g/dL (ref 30.0–36.0)
MCV: 88.3 fL (ref 78.0–100.0)
MCV: 82.9 fL (ref 78.0–100.0)
MONOS PCT: 8 %
Monocytes Absolute: 1.1 10*3/uL — ABNORMAL HIGH (ref 0.1–1.0)
Monocytes Absolute: 1.2 10*3/uL — ABNORMAL HIGH (ref 0.1–1.0)
Monocytes Relative: 4 %
NEUTROS ABS: 24.3 10*3/uL — AB (ref 1.7–7.7)
NEUTROS PCT: 80 %
Neutro Abs: 12.5 10*3/uL — ABNORMAL HIGH (ref 1.7–7.7)
Neutrophils Relative %: 88 %
PLATELETS: 396 10*3/uL (ref 150–400)
Platelets: 288 10*3/uL (ref 150–400)
RBC: 3.41 MIL/uL — ABNORMAL LOW (ref 3.87–5.11)
RBC: 3.22 MIL/uL — ABNORMAL LOW (ref 3.87–5.11)
RDW: 16.5 % — ABNORMAL HIGH (ref 11.5–15.5)
RDW: 16 % — ABNORMAL HIGH (ref 11.5–15.5)
WBC: 27.6 10*3/uL — AB (ref 4.0–10.5)
WBC: 15.5 10*3/uL — ABNORMAL HIGH (ref 4.0–10.5)

## 2016-05-01 LAB — TROPONIN I
Troponin I: <0.03 ng/mL (ref <0.031)
Troponin I: <0.03 ng/mL (ref <0.031)
Troponin I: <0.03 ng/mL (ref <0.031)

## 2016-05-01 LAB — LACTIC ACID, PLASMA
Lactic Acid, Venous: 4.1 mmol/L (ref 0.5–2.0)
Lactic Acid, Venous: 4.4 mmol/L (ref 0.5–2.0)
Lactic Acid, Venous: 1.3 mmol/L (ref 0.5–2.0)

## 2016-05-01 LAB — TYPE AND SCREEN
ABO/RH(D): A POS
Antibody Screen: NEGATIVE

## 2016-05-01 LAB — POCT I-STAT, CHEM 8
BUN: 13 mg/dL (ref 6–20)
Calcium, Ion: 1.22 mmol/L (ref 1.12–1.23)
Chloride: 108 mmol/L (ref 101–111)
Creatinine, Ser: 0.9 mg/dL (ref 0.44–1.00)
Glucose, Bld: 587 mg/dL (ref 65–99)
HEMATOCRIT: 33 % — AB (ref 36.0–46.0)
Hemoglobin: 11.2 g/dL — ABNORMAL LOW (ref 12.0–15.0)
POTASSIUM: 3.7 mmol/L (ref 3.5–5.1)
SODIUM: 138 mmol/L (ref 135–145)
TCO2: 10 mmol/L (ref 0–100)

## 2016-05-01 LAB — PROTIME-INR
INR: 4.69 — ABNORMAL HIGH (ref 0.00–1.49)
PROTHROMBIN TIME: 42.8 s — AB (ref 11.6–15.2)

## 2016-05-01 LAB — PHOSPHORUS
PHOSPHORUS: 7.3 mg/dL — AB (ref 2.5–4.6)
Phosphorus: 3.5 mg/dL (ref 2.5–4.6)

## 2016-05-01 LAB — MAGNESIUM
MAGNESIUM: 2.1 mg/dL (ref 1.7–2.4)
MAGNESIUM: 1.5 mg/dL — AB (ref 1.7–2.4)
Magnesium: 2 mg/dL (ref 1.7–2.4)

## 2016-05-01 LAB — HEPATIC FUNCTION PANEL
ALBUMIN: 2.7 g/dL — AB (ref 3.5–5.0)
ALK PHOS: 123 U/L (ref 38–126)
ALT: 20 U/L (ref 14–54)
AST: 39 U/L (ref 15–41)
Bilirubin, Direct: 0.2 mg/dL (ref 0.1–0.5)
Indirect Bilirubin: 1.3 mg/dL — ABNORMAL HIGH (ref 0.3–0.9)
TOTAL PROTEIN: 5.6 g/dL — AB (ref 6.5–8.1)
Total Bilirubin: 1.5 mg/dL — ABNORMAL HIGH (ref 0.3–1.2)

## 2016-05-01 LAB — CBG MONITORING, ED

## 2016-05-01 LAB — AMMONIA: AMMONIA: 36 umol/L — AB (ref 9–35)

## 2016-05-01 LAB — PROCALCITONIN: PROCALCITONIN: 2.49 ng/mL

## 2016-05-01 LAB — BETA-HYDROXYBUTYRIC ACID

## 2016-05-01 LAB — MRSA PCR SCREENING: MRSA by PCR: NEGATIVE

## 2016-05-01 MED ORDER — ANTISEPTIC ORAL RINSE SOLUTION (CORINZ)
7.0000 mL | OROMUCOSAL | Status: DC
Start: 2016-05-01 — End: 2016-05-02
  Administered 2016-05-01 – 2016-05-02 (×14): 7 mL via OROMUCOSAL

## 2016-05-01 MED ORDER — FAMOTIDINE IN NACL 20-0.9 MG/50ML-% IV SOLN
20.0000 mg | Freq: Every day | INTRAVENOUS | Status: DC
  Administered 2016-05-01 – 2016-05-03 (×3): 20 mg via INTRAVENOUS
  Filled 2016-05-01 (×4): qty 50

## 2016-05-01 MED ORDER — PHENYLEPHRINE HCL 10 MG/ML IJ SOLN
0.0000 ug/min | INTRAMUSCULAR | Status: DC
  Administered 2016-05-01: 130 ug/min via INTRAVENOUS
  Administered 2016-05-01: 40 ug/min via INTRAVENOUS
  Administered 2016-05-01 (×2): 130 ug/min via INTRAVENOUS
  Administered 2016-05-02: 50 ug/min via INTRAVENOUS
  Administered 2016-05-02: 110 ug/min via INTRAVENOUS
  Administered 2016-05-03: 30 ug/min via INTRAVENOUS
  Filled 2016-05-01 (×11): qty 4

## 2016-05-01 MED ORDER — CHLORHEXIDINE GLUCONATE 0.12% ORAL RINSE (MEDLINE KIT)
15.0000 mL | Freq: Two times a day (BID) | OROMUCOSAL | Status: DC
  Administered 2016-05-01 – 2016-05-02 (×4): 15 mL via OROMUCOSAL

## 2016-05-01 MED ORDER — FENTANYL CITRATE (PF) 100 MCG/2ML IJ SOLN: 25.0000 ug | INTRAMUSCULAR | Status: DC | PRN

## 2016-05-01 MED ORDER — MAGNESIUM SULFATE 2 GM/50ML IV SOLN
INTRAVENOUS | Status: AC
  Filled 2016-05-01: qty 50

## 2016-05-01 MED ORDER — SODIUM CHLORIDE 0.9 % IV SOLN
INTRAVENOUS | Status: DC
  Administered 2016-05-01 (×2): via INTRAVENOUS

## 2016-05-01 MED ORDER — STERILE WATER FOR INJECTION IV SOLN
INTRAVENOUS | Status: DC
  Administered 2016-05-01 – 2016-05-02 (×4): via INTRAVENOUS
  Filled 2016-05-01 (×9): qty 850

## 2016-05-01 MED ORDER — SODIUM CHLORIDE 0.9% FLUSH
10.0000 mL | Freq: Two times a day (BID) | INTRAVENOUS | Status: DC
  Administered 2016-05-01: 30 mL
  Administered 2016-05-01 – 2016-05-03 (×4): 10 mL

## 2016-05-01 MED ORDER — SODIUM BICARBONATE 8.4 % IV SOLN
INTRAVENOUS | Status: AC
  Filled 2016-05-01: qty 50

## 2016-05-01 MED ORDER — SODIUM CHLORIDE 0.9 % IV SOLN: INTRAVENOUS | Status: AC

## 2016-05-01 MED ORDER — SODIUM CHLORIDE 0.9% FLUSH: 10.0000 mL | INTRAVENOUS | Status: DC | PRN

## 2016-05-01 MED ORDER — DEXTROSE-NACL 5-0.45 % IV SOLN
INTRAVENOUS | Status: DC
  Administered 2016-05-01 – 2016-05-03 (×4): via INTRAVENOUS

## 2016-05-01 MED ORDER — MAGNESIUM SULFATE 2 GM/50ML IV SOLN
2.0000 g | Freq: Once | INTRAVENOUS | Status: AC
  Administered 2016-05-01: 2 g via INTRAVENOUS

## 2016-05-01 MED ORDER — SODIUM CHLORIDE 0.9 % IV SOLN: 250.0000 mL | INTRAVENOUS | Status: DC | PRN

## 2016-05-01 MED ORDER — SODIUM CHLORIDE 0.9 % IV SOLN
INTRAVENOUS | Status: DC
  Administered 2016-05-01: 4.9 [IU]/h via INTRAVENOUS
  Administered 2016-05-01: 10.8 [IU]/h via INTRAVENOUS

## 2016-05-01 MED ORDER — MIDAZOLAM HCL 2 MG/2ML IJ SOLN
1.0000 mg | INTRAMUSCULAR | Status: DC | PRN
  Administered 2016-05-01: 2 mg via INTRAVENOUS
  Administered 2016-05-01 – 2016-05-02 (×5): 4 mg via INTRAVENOUS
  Filled 2016-05-01 (×5): qty 4
  Filled 2016-05-01: qty 2

## 2016-05-01 MED ORDER — PIPERACILLIN-TAZOBACTAM 3.375 G IVPB
3.3750 g | Freq: Three times a day (TID) | INTRAVENOUS | Status: DC
  Administered 2016-05-01 – 2016-05-05 (×13): 3.375 g via INTRAVENOUS
  Filled 2016-05-01 (×16): qty 50

## 2016-05-01 MED ORDER — SODIUM PHOSPHATES 45 MMOLE/15ML IV SOLN
30.0000 mmol | Freq: Once | INTRAVENOUS | Status: AC
  Administered 2016-05-01: 30 mmol via INTRAVENOUS
  Filled 2016-05-01: qty 10

## 2016-05-01 MED ORDER — ACETAMINOPHEN 160 MG/5ML PO SOLN
650.0000 mg | ORAL | Status: DC | PRN
  Administered 2016-05-01: 650 mg
  Filled 2016-05-01: qty 20.3

## 2016-05-01 MED ORDER — VANCOMYCIN HCL IN DEXTROSE 1-5 GM/200ML-% IV SOLN
1000.0000 mg | INTRAVENOUS | Status: DC
  Administered 2016-05-02 – 2016-05-03 (×2): 1000 mg via INTRAVENOUS
  Filled 2016-05-01 (×2): qty 200

## 2016-05-01 MED ORDER — POTASSIUM CHLORIDE 10 MEQ/50ML IV SOLN
10.0000 meq | INTRAVENOUS | Status: AC
  Administered 2016-05-01 (×4): 10 meq via INTRAVENOUS
  Filled 2016-05-01 (×4): qty 50

## 2016-05-01 NOTE — ED Notes (Signed)
No purposeful movement, no change in pupils - remain fixed and dilated

## 2016-05-01 NOTE — Progress Notes (Signed)
Pharmacy Antibiotic Note  Madison [name] is a 55 y.o. female admitted on 04/30/2016 with DKA/possible sepsis.  Pharmacy has been consulted for Vancomycin and Zosyn dosing.  Vancomycin 1 g IV ordered by EDMD  Plan: Vancomycin 1 g IV q24h Zosyn 3.375 g IV q8h . F/U renal function     Temp (24hrs), Avg:94.9 F (34.9 C), Min:94.3 F (34.6 C), Max:96.3 F (35.7 C)   Recent Labs Lab 04/30/16 2250 04/30/16 2304  WBC 24.3*  --   CREATININE 1.85* 1.00    Estimated Creatinine Clearance: 52.6 mL/min (by C-G formula based on Cr of 1).    Allergies  Allergen Reactions  . Adhesive [Tape] Other (See Comments)    Can burn skin if left on too long  . Celebrex [Celecoxib] Rash  . Detrol [Tolterodine] Hives    Antimicrobials this admission: Vancomycin 6/13 >>  Zosyn 6/13 >>   Eddie Candle 05/01/2016 12:30 AM

## 2016-05-01 NOTE — Consult Note (Signed)
   [facility] CM Inpatient Consult   05/01/2016  Madison [name] June 01, 1961 [id number]  Patient is currently active with [facility] Care Management for chronic disease management services.  Patient has been engaged by a [employer] and CSW with several attempts to communicate, out reach, and work with her on multiple occasions.   Patient has had multiple admissions,  9 in the past 6 months mostly with DKA.   Our community based plan of care has focused on disease management and community resource support. RN [facility] is aware of the patient's admission.  Will make Inpatient Case Manager aware that [facility] Care Management following. Patient is currently on the vent.  Of note, [facility] Care Management services does not replace or interfere with any services that are needed or arranged by inpatient case management or social work.  For additional questions or referrals please contact:  Charlesetta Shanks, RN BSN CCM [facility]  [phone] business mobile phone Toll free office [phone]

## 2016-05-01 NOTE — Progress Notes (Signed)
CRITICAL VALUE ALERT  Critical value received:  Lactic Acid 4.1  Date of notification:  05/01/16  Time of notification:  0230  Critical value read back:yes  Nurse who received alert: Marshia Ly  MD notified (1st page):  Dr. Arsenio Loader  Also spoke with Dr. Arsenio Loader re: temp and rate of bicarb. Will continue to monitor closely.

## 2016-05-01 NOTE — Progress Notes (Signed)
EEG Completed; Results Pending  

## 2016-05-01 NOTE — Procedures (Signed)
HPI:  55 y/o with mental status change  TECHNICAL SUMMARY:  A multichannel referential and bipolar montage EEG using the standard international 10-20 system was performed on the patient described as on the ventilator, but not under sedation and following commands..  The dominant background activity consists of a disorganized 6 hertz activity seen most prominantly over the posterior head region.  3-5 Hz activity can be seen intermixed in all head regions.  Low voltage fast (beta) activity is distributed symmetrically and maximally over the anterior head regions.  There is a significant amount of myogenic artifact as the patient was shivering throughout the recording.  Therefore, the averager had to be used.  ACTIVATION:  Stepwise photic stimulation and hyperventilation are not performed.  EPILEPTIFORM ACTIVITY:  There were no spikes, sharp waves or paroxysmal activity.  SLEEP:  No sleep  CARDIAC:  The EKG lead was not well recorded.  IMPRESSION:  This is an abnormal EEG demonstrating a moderate diffuse slowing of electrocerebral activity.  This can be seen in a wide variety of encephalopathic state including those of a toxic, metabolic, or degenerative nature.  There were no focal, hemispheric, or lateralizing features.  No epileptiform activity was recorded.

## 2016-05-01 NOTE — Progress Notes (Signed)
Patient not requiring restraints at this time. Safety mitts are applied. Will continue to monitor closely.

## 2016-05-01 NOTE — Patient Outreach (Signed)
[facility]) Care Management  05/01/2016  ARLET MARTER 1961-04-18 [id number]   Arrived at patient's home for community care coordination. There was no answer at the door by knock or ranging the door bell. This RNMC checked EPIC, noted acute care admission.  Will contact hospital liaison and request follow up.  Plan: Will make contact 24-72 hours after discharge from acute care setting.

## 2016-05-01 NOTE — Progress Notes (Signed)
eLink Physician-Brief Progress Note Patient Name: Madison [name] DOB: [birth year] MRN: [id number]   Date of Service  05/01/2016  HPI/Events of Note  BG=546, pt was transitioned off insulin drip this am. Recent chem; no acidosis.   eICU Interventions  Start sliding scale with bolus insulin. Will monitor, may need to change back to drip.      Intervention Category Intermediate Interventions: Hyperglycemia - evaluation and treatment  Shane Crutch 05/01/2016, 8:52 PM

## 2016-05-01 NOTE — Progress Notes (Signed)
eLink Physician-Brief Progress Note Patient Name: Madison [name] DOB: 22-Oct-1961 MRN: [id number]   Date of Service  05/01/2016  HPI/Events of Note  K+ = 3.3 and Creatinine = 1.67.  eICU Interventions  Will replete K+.     Intervention Category Intermediate Interventions: Electrolyte abnormality - evaluation and management  [name] 05/01/2016, 6:02 AM

## 2016-05-01 NOTE — Progress Notes (Signed)
eLink Physician-Brief Progress Note Patient Name: Madison [name] DOB: 1960-12-11 MRN: [id number]   Date of Service  05/01/2016  HPI/Events of Note  Multiple issues: 1. Temp = 94.5 and 2. Lactic Acid = 4.1. Will continue fluid resusitation and trend Lactic Acid.   eICU Interventions  Will order: 1. [employer]. 2. Increase NaHCO3 IV infusion to 100 mL/hour and decrease 0.9 NaCl IV infusion to 100 mL/hour.     Intervention Category Major Interventions: Acid-Base disturbance - evaluation and management;Other:  Lenell Antu 05/01/2016, 3:34 AM

## 2016-05-01 NOTE — ED Notes (Signed)
Notified Ruch MD of critical INR 5.44

## 2016-05-01 NOTE — Progress Notes (Addendum)
Inpatient Diabetes Program Recommendations  AACE/ADA: New Consensus Statement on Inpatient Glycemic Control (2015)  Target Ranges:  Prepandial:   less than 140 mg/dL      Peak postprandial:   less than 180 mg/dL (1-2 hours)      Critically ill patients:  140 - 180 mg/dL   Results for Madison [name], Madison [name] (MRN [id number]) as of 05/01/2016 09:16  Ref. Range 05/01/2016 04:07 05/01/2016 05:01 05/01/2016 06:04 05/01/2016 07:04 05/01/2016 08:10  Glucose-Capillary Latest Ref Range: 65-99 mg/dL 003 (HH) 704 (H) 888 (H) 315 (H) 240 (H)    Review of Glycemic Control  Diabetes history: DM  Outpatient Diabetes medications: Levemir 30 units daily (just increased 6/12 from 25 units daily)+ Novolog correction scale CBG 70 - 120: 0 units  CBG 121 - 150: 2 unit  CBG 151 - 200: 3 units  CBG 201 - 250: 5 units  CBG 251 - 300: 6units  CBG 301 - 350: 7units  CBG 351 - 400: 10 units  CBG > 400: call MD. + ? Novolog 5 units tid meal coverage Current orders for Inpatient glycemic control: IV insulin drip via glucostabilizer per DKA protocol.  Inpatient Diabetes Program Recommendations:  Noted patient has been admitted monthly with elevated blood glucoses over the past months Jan-June (exception of February) with diagnose of DKA. Patient is followed by endocrinologist Dr. Everardo All. When patient ready for transition from IV insulin drip, please note patient will need to be given basal insulin 2 hrs. prior to IV drip being discontinued and cover CBG when the insulin drip is discontinued with Novolog correction scale. Will follow patient in hospitalization.  Thank you, Billy Fischer. Joelyn Lover, RN, MSN, CDE Inpatient Glycemic Control Team Team Pager [phone] (8am-5pm) 05/01/2016 9:50 AM

## 2016-05-01 NOTE — Progress Notes (Addendum)
PULMONARY / CRITICAL CARE MEDICINE   Name: Madison [name] MRN: [id number] DOB: 07/23/1961    ADMISSION DATE:  04/30/2016 CONSULTATION DATE:  05/01/2016  REFERRING MD:  Kittie Plater, M.D. / EDP  CHIEF COMPLAINT:  DKA  HISTORY OF PRESENT ILLNESS:  Patient known to our service from previous hospitalization early last month with diabetic ketoacidosis. Per hospital record patient presented with altered mental status found down today by her roommate who is a home health aid. Patient was noted to have rapid shallow breathing as well as diabetic ketoacidosis. Patient was found unresponsive and posturing. Eyes were deviated to decide upon EMS arrival. After arrival in the emergency department she was intubated. Patient developed hypotension postintubation on propofol therefore propofol infusion was stopped. Upon further review the patient's electronic medical record during office visit on 6/12 she had reported her blood glucose continued to be in the 500s despite a regimen of Levemir 25 units and 5 units of NovoLog before meals. Patient had reported eating normally and denied any weakness, falls, "feeling acutely ill", or hypoglycemia. Patient was noted to have sinus tachycardia during that visit on EKG. Levemir was increased to 30 units and patient was referred to endocrinology. Patient declined to go to the emergency department at that time.   SUBJECTIVE: As above./Intubated and aggitated  VITAL SIGNS: BP 130/66 mmHg  Pulse 96  Temp(Src) 100 F (37.8 C) (Core (Comment))  Resp 27  Wt 120 lb 5.9 oz (54.6 kg)  SpO2 100%  HEMODYNAMICS:    VENTILATOR SETTINGS: Vent Mode:  [-] PRVC FiO2 (%):  [50 %-100 %] 50 % Set Rate:  [24 bmp-30 bmp] 30 bmp Vt Set:  [410 mL] 410 mL PEEP:  [5 cmH20] 5 cmH20 Plateau Pressure:  [13 cmH20-14 cmH20] 13 cmH20  INTAKE / OUTPUT: I/O last 3 completed shifts: In: 4134.4 [I.V.:4034.4; IV Piggyback:100] Out: 2340 [Urine:2340]  PHYSICAL EXAMINATION: General:  No  acute distress. No family at bedside. Nurse at bedside Integument:  Cool & dry. No rash on exposed skin.  Lymphatics:  No appreciated cervical or supraclavicular lymphadenoapthy. HEENT:  Dry mucus membranes. No scleral injection or icterus. Endotracheal tube in place.  Cardiovascular:  Tachycardic. No edema. No appreciable JVD.  Pulmonary:  Clear bilaterally to auscultation. Symmetric chest wall rise on ventilator.Scattered rhonchi Abdomen: Soft. Normal bowel sounds. Nondistended.  Musculoskeletal:  Normal bulk and tone. No joint deformity or effusion appreciated. Neurological:  MAE x 4, nods appropriately, follows simple commands. Pupils dilated and nonresponsive. Positive cough and gag.Patient is overbreathing ventilator.positive focusing/ tracking Psychiatric: Anxious/ coughing against vent.  LABS:  BMET  Recent Labs Lab 05/01/16 0155 05/01/16 0407 05/01/16 0500 05/01/16 0900  NA 133* 138 136 139  K 5.3* 3.7 3.3* 3.5  CL 103 108 106 108  CO2 7*  --  9* 20*  BUN 16 13 13 11   CREATININE 1.80* 0.90 1.67* 1.15*  GLUCOSE 832* 587* 496* 209*    Electrolytes  Recent Labs Lab 05/01/16 0155 05/01/16 0500 05/01/16 0900  CALCIUM 7.9* 8.2* 8.4*  MG 2.1  --  1.5*  PHOS 7.3*  --  <1.0*    CBC  Recent Labs Lab 04/30/16 2250 04/30/16 2304 05/01/16 0407 05/01/16 0500  WBC 24.3*  --   --  27.6*  HGB 9.0* 11.2* 11.2* 9.3*  HCT 31.4* 33.0* 33.0* 30.1*  PLT 425*  --   --  396    Coag's  Recent Labs Lab 04/30/16 04/30/16 2250 05/01/16 0500  INR 3.4 5.44* 4.69*  Sepsis Markers  Recent Labs Lab 05/01/16 0155 05/01/16 0900  LATICACIDVEN 4.1* 4.4*  PROCALCITON 2.49  --     ABG  Recent Labs Lab 04/30/16 2254  PHART 6.828*  PCO2ART 22.9*  PO2ART 618.0*    Liver Enzymes  Recent Labs Lab 05/01/16 0155  AST 39  ALT 20  ALKPHOS 123  BILITOT 1.5*  ALBUMIN 2.7*    Cardiac Enzymes  Recent Labs Lab 04/30/16 2250 05/01/16 0155 05/01/16 0655   TROPONINI <0.03 <0.03 <0.03    Glucose  Recent Labs Lab 05/01/16 0501 05/01/16 0604 05/01/16 0704 05/01/16 0810 05/01/16 0909 05/01/16 1013  GLUCAP 483* 394* 315* 240* 212* 161*    Imaging Ct Head Wo Contrast  04/30/2016  CLINICAL DATA:  Acute onset of shortness of breath. Initial encounter. EXAM: CT HEAD WITHOUT CONTRAST TECHNIQUE: Contiguous axial images were obtained from the base of the skull through the vertex without intravenous contrast. COMPARISON:  CT of the head performed 02/26/2016 FINDINGS: There is no evidence of acute infarction, mass lesion, or intra- or extra-axial hemorrhage on CT. Prominence of the ventricles and sulci reflects mild to moderate cortical volume loss. 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 Diffuse periventricular and subcortical white matter change likely reflects small vessel ischemic microangiopathy. Small chronic infarcts are noted at the cerebellar hemispheres bilaterally, more prominent on the left. The brainstem and fourth ventricle are within normal limits. The basal ganglia are unremarkable in appearance. No mass effect or midline shift is seen. There is no evidence of fracture; visualized osseous structures are unremarkable in appearance. The orbits are within normal limits. The paranasal sinuses and mastoid air cells are well-aerated. No significant soft tissue abnormalities are seen. IMPRESSION: 1. No acute intracranial pathology seen on CT. 2. Mild to moderate cortical volume loss and diffuse small vessel ischemic microangiopathy. 3. 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 4. Small chronic infarcts at the cerebellar hemispheres bilaterally, more prominent on the left. Electronically Signed   By: Roanna Raider M.D.    On: 04/30/2016 23:43   Dg Chest Portable 1 View  04/30/2016  CLINICAL DATA:  Unresponsive patient. Endotracheal tube and enteric tubes were placed. Shortness of breath. EXAM: PORTABLE CHEST 1 VIEW COMPARISON:  03/26/2016 FINDINGS: Postoperative changes in the mediastinum. An endotracheal tube has been placed with tip measuring 3 cm above the carina. An enteric tube has been placed with tip in the left upper quadrant consistent with location in the upper stomach. Normal heart size and pulmonary vascularity. No focal airspace disease or consolidation in the lungs. No blunting of costophrenic angles. No pneumothorax. Mediastinal contours appear intact. IMPRESSION: Appliances appear to be in satisfactory position. No evidence of active pulmonary disease. Electronically Signed   By: Burman Nieves M.D.   On: 04/30/2016 23:15    STUDIES:  CT HEAD W/O 6/12: Mild to moderate cortical volume loss. Large chronic infarcts noted in right frontal, parietal, and occipital lobes. Large chronic infarcts also noted at the left parietal and occipital lobes with associated encephalomalacia. Diffuse periventricular & subcortical white matter change reflecting small vessel ischemic microangiopathy. Also noted are small chronic infarcts in cerebellar hemispheres bilaterally more prominent on the left. No mass effect or midline shift. No evidence of fracture.  PORT CXR 6/12: No focal opacity or effusion. Endotracheal tube in acceptable  position. Enteric tube with tip below the diaphragm.  MICROBIOLOGY: MRSA PCR 6/13 >>Neg Blood Ctx x2 6/13 >> Tracheal Asp Ctx 6/13 >> Urine Ctx 6/12 >>  ANTIBIOTICS: Vancomycin 6/13 >> Zosyn 6/13 >>  SIGNIFICANT EVENTS: 4/29 - 5/02 - Admit w/ DKA 5/08 - 5/16 - Admit w/ DKA 6/13 - Admit with DKA  LINES/TUBES: OETT 7.0 6/12 >> R Femoral CVL 6/13 (EDP) >> Foley 6/12 >> OGT 6/12 >> R & L EJ IV 6/12 >>  DISCUSSION:  55 year old female with known history of type 1 diabetes  mellitus as well as aortic valve replacement on chronic systemic anticoagulation with Coumadin. Patient has had multiple repeated admissions for diabetic ketoacidosis. Presents after being found down today for an unknown period of time but after her primary care physician appointment today. Patient has multisystem organ failure with acute encephalopathy, shock, acute renal failure, & diabetic ketoacidosis. I had a lengthy discussion with the patient's sister via phone. Reportedly the patient has a son in [place] that she will attempt to contact. At this time the patient's sister reports she would not wish to undergo resuscitation in the event of cardiac arrest or arrhythmia. I asked this question of her twice to ensure that I was clear on her response. We will continue treatment of her DKA and correction of her electrolyte abnormalities while investigating the potential cause for such an acute decompensation.  ASSESSMENT / PLAN:  ENDOCRINE A:   DKA - Just admitted & discharged w/ DKA in May. H/O DM Type I - Poorly controlled.   P:   Holding home Levemir Checking beta hydroxybutyrate Insulin drip per DKA protocol  PULMONARY A: Acute Respiratory Failure - Secondary to altered mental status.  P:   Full Vent Support SBT & WUA as mental status allows CXR in 6/13  CARDIOVASCULAR A:  Shock - Likely hypovolemia versus sepsis. S/P Aortic Valve Replacement Chronic Systolic CHF H/O CAD - S/P CABG. H/O Hyperlipidemia Remains on neo/ titration for goal pressures Troponins x 3 < 0.03  P:  Monitor on telemetry Vitals per unit protocol Holding Coumadin Heparin drip per pharmacy protocol once INR <2.0 Holding home Pravachol Neo-Synephrine to maintain MAP >65 & SBP >90 Trending troponin I every 6 hours Checking complete echocardiogram pending 6/13  RENAL A:   Hyperkalemia - Likely secondary to metabolic acidosis. S/P IV Calcium.>> resolving Metabolic Acidosis: resolved, GAP has normalized to  11 with last BMET Acute Renal Failure - Likely due to dehydration. Pseudo-hyponatremia Hypophosphoremia >>repleted 6/13 Lactic Acid remains elevated at 4.4 CK down trending at  23 U/L Creatinine down trending 1.15 mg/dl Good Urine output  P:   Trending electrolytes per DKA protocol Monitoring UOP with Foley/ Strick I&O Replacing electrolytes as indicated Bicarb drip 100cc/hr:  Continue Trending Lactic Acid q 8hr BMET daily/ trend BUN/ Creat    GASTROINTESTINAL A:   No acute issues. H/O GERD LFT's WNL 6/13  P:   NPO Pepcid IV daily   HEMATOLOGIC A:   Anemia - No obvious signs of active bleeding. Slightly worse than baseline. HGB 11.2>>9.3 : Positive 1700 cc's overnight INR: 4.69 ( down trending) Leukocytosis: WBC 27 from 24 overnight. Coagulopathy  - Secondary to Coumadin.  P:  Type & Screen.done Trending cell count daily w/ CBC CBC at 1400 to re-assess HGB Daily Coags Transfuse per ICU guidelines  INFECTIOUS A:   SIRS T Max 100.4 Leukocytosis 27.6  P:   Empiric Vancomycin & Zosyn Day #2 PCT per algorithm > PCT elevated Awaiting  Blood, Urine, & Tracheal Aspirate Cultures Trend WBC and Fever Curve Narrow ABX coverage with PCT guidance  Continue to monitor Lactic acid  NEUROLOGIC A:   Acute Encephalopathy - Likely metabolic. Gaze Deviation - No reported witnessed seizures. 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 H/O Depression/Anxiety Sedation on Ventilator. Following commands   P:   RASS goal: 0 to -1 Versed IV prn Fentanyl IV prn Holding home Celexa & Trazodone. Stat EEG pending  UDS negative  Neuro Checks q2hr Seizure Precautions  FAMILY  - Updates: No family at bedside 6/13. Sister [name] updated via phone by Dr. Jamison Neighbor 6/13.  - Inter-disciplinary family meet or Palliative Care meeting due by:  6/20   Bevelyn Ngo, AGACNP-BC [facility] After 3pm or if no response, call [phone] 10:47 AM  05/01/2016    ATTENDING NOTE / ATTESTATION NOTE :   I have discussed the case with the resident/APP Kandice Robinsons.  I agree with the resident/APP's  history, physical examination, assessment, and plans.  I have edited the above note and modified it according to our agreed history, physical examination, assessment and plan.   I have spent 32  minutes of critical care time with this patient today. Cont management of DKA. No signs of infection but pt with fever and elevated WBC.  CXR normal. Will await cultures before deescalating.   Family : No family at bedside.    Pollie Meyer, MD 05/01/2016, 11:46 AM [facility] Pager [phone] After 3 pm or if no answer, call [phone]

## 2016-05-01 NOTE — Progress Notes (Signed)
eLink Physician-Brief Progress Note Patient Name: Madison [name] DOB: 1961/08/10 MRN: [id number]   Date of Service  05/01/2016  HPI/Events of Note  fevers  eICU Interventions  Ordered tylenol, recultured.      Intervention Category Intermediate Interventions: Infection - evaluation and management  Shane Crutch 05/01/2016, 5:58 PM

## 2016-05-01 NOTE — Progress Notes (Signed)
Pt transported to 2H10 without event.  RT given report, and will monitor.

## 2016-05-02 ENCOUNTER — Inpatient Hospital Stay (HOSPITAL_COMMUNITY): Payer: Commercial Managed Care - HMO

## 2016-05-02 DIAGNOSIS — R579 Shock, unspecified: Secondary | ICD-10-CM

## 2016-05-02 LAB — ECHOCARDIOGRAM COMPLETE
AO mean calculated velocity dopler: 153 cm/s
AOPV: 0.41 m/s
AV Peak grad: 23 mmHg
AV VEL mean LVOT/AV: 0.43
AV pk vel: 242 cm/s
AVG: 11 mmHg
CHL CUP MV DEC (S): 282
EERAT: 10.51
EWDT: 282 ms
FS: 40 % (ref 28–44)
Height: 63 in
IV/PV OW: 1.32
LA diam index: 2.12 cm/m2
LA vol A4C: 30.4 ml
LASIZE: 34 mm
LAVOL: 30.4 mL
LAVOLIN: 18.9 mL/m2
LEFT ATRIUM END SYS DIAM: 34 mm
LV E/e' medial: 10.51
LV E/e'average: 10.51
LVELAT: 9.9 cm/s
LVOT VTI: 20.8 cm
LVOT peak vel: 98.7 cm/s
LVOTVTI: 0.44 cm
MV Peak grad: 4 mmHg
MV pk E vel: 104 m/s
MVPKAVEL: 107 m/s
PW: 9.07 mm — AB (ref 0.6–1.1)
TAPSE: 13 mm
TDI e' lateral: 9.9
TDI e' medial: 6.64
VTI: 47.5 cm
Weight: 2035.29 oz

## 2016-05-02 LAB — BASIC METABOLIC PANEL
ANION GAP: 11 (ref 5–15)
ANION GAP: 12 (ref 5–15)
Anion gap: 19 — ABNORMAL HIGH (ref 5–15)
BUN: 8 mg/dL (ref 6–20)
BUN: 6 mg/dL (ref 6–20)
BUN: 6 mg/dL (ref 6–20)
CALCIUM: 7.7 mg/dL — AB (ref 8.9–10.3)
CALCIUM: 7.3 mg/dL — AB (ref 8.9–10.3)
CHLORIDE: 103 mmol/L (ref 101–111)
CHLORIDE: 101 mmol/L (ref 101–111)
CO2: 15 mmol/L — AB (ref 22–32)
CO2: 25 mmol/L (ref 22–32)
CO2: 23 mmol/L (ref 22–32)
CREATININE: 1.53 mg/dL — AB (ref 0.44–1.00)
CREATININE: 1 mg/dL (ref 0.44–1.00)
Calcium: 7.5 mg/dL — ABNORMAL LOW (ref 8.9–10.3)
Chloride: 99 mmol/L — ABNORMAL LOW (ref 101–111)
Creatinine, Ser: 0.99 mg/dL (ref 0.44–1.00)
GFR calc non Af Amer: 37 mL/min — ABNORMAL LOW (ref >60)
GFR calc non Af Amer: >60 mL/min (ref >60)
GFR calc non Af Amer: >60 mL/min (ref >60)
GFR, EST AFRICAN AMERICAN: 43 mL/min — AB (ref >60)
GLUCOSE: 106 mg/dL — AB (ref 65–99)
Glucose, Bld: 257 mg/dL — ABNORMAL HIGH (ref 65–99)
Glucose, Bld: 238 mg/dL — ABNORMAL HIGH (ref 65–99)
POTASSIUM: 3.1 mmol/L — AB (ref 3.5–5.1)
Potassium: 2.7 mmol/L — CL (ref 3.5–5.1)
Potassium: 3 mmol/L — ABNORMAL LOW (ref 3.5–5.1)
SODIUM: 137 mmol/L (ref 135–145)
Sodium: 137 mmol/L (ref 135–145)
Sodium: 134 mmol/L — ABNORMAL LOW (ref 135–145)

## 2016-05-02 LAB — BASIC METABOLIC PANEL WITH GFR
Anion gap: 7 (ref 5–15)
BUN: <5 mg/dL — ABNORMAL LOW (ref 6–20)
CO2: 28 mmol/L (ref 22–32)
Calcium: 7.4 mg/dL — ABNORMAL LOW (ref 8.9–10.3)
Chloride: 101 mmol/L (ref 101–111)
Creatinine, Ser: 0.79 mg/dL (ref 0.44–1.00)
GFR calc Af Amer: >60 mL/min
GFR calc non Af Amer: >60 mL/min
Glucose, Bld: 118 mg/dL — ABNORMAL HIGH (ref 65–99)
Potassium: 3.6 mmol/L (ref 3.5–5.1)
Sodium: 136 mmol/L (ref 135–145)

## 2016-05-02 LAB — POCT I-STAT 3, ART BLOOD GAS (G3+)
Bicarbonate: 3 mEq/L — ABNORMAL LOW (ref 20.0–24.0)
O2 SAT: 94 % (ref 90.0–100.0)
PATIENT TEMPERATURE: 35.7
PO2 ART: 127 mmHg — AB (ref 80.0–100.0)
TCO2: <5 mmol/L (ref 0–100)
pCO2 arterial: 18.8 mmHg — ABNORMAL LOW (ref 35.0–45.0)
pH, Arterial: 6.8 — ABNORMAL LOW (ref 7.350–7.400)

## 2016-05-02 LAB — URINE CULTURE: Culture: NO GROWTH

## 2016-05-02 LAB — GLUCOSE, CAPILLARY
GLUCOSE-CAPILLARY: 112 mg/dL — AB (ref 65–99)
GLUCOSE-CAPILLARY: 120 mg/dL — AB (ref 65–99)
GLUCOSE-CAPILLARY: 121 mg/dL — AB (ref 65–99)
GLUCOSE-CAPILLARY: 132 mg/dL — AB (ref 65–99)
GLUCOSE-CAPILLARY: 191 mg/dL — AB (ref 65–99)
GLUCOSE-CAPILLARY: 541 mg/dL — AB (ref 65–99)
GLUCOSE-CAPILLARY: 55 mg/dL — AB (ref 65–99)
GLUCOSE-CAPILLARY: 57 mg/dL — AB (ref 65–99)
Glucose-Capillary: 105 mg/dL — ABNORMAL HIGH (ref 65–99)
Glucose-Capillary: 106 mg/dL — ABNORMAL HIGH (ref 65–99)
Glucose-Capillary: 106 mg/dL — ABNORMAL HIGH (ref 65–99)
Glucose-Capillary: 110 mg/dL — ABNORMAL HIGH (ref 65–99)
Glucose-Capillary: 129 mg/dL — ABNORMAL HIGH (ref 65–99)
Glucose-Capillary: 138 mg/dL — ABNORMAL HIGH (ref 65–99)
Glucose-Capillary: 161 mg/dL — ABNORMAL HIGH (ref 65–99)
Glucose-Capillary: 163 mg/dL — ABNORMAL HIGH (ref 65–99)
Glucose-Capillary: 204 mg/dL — ABNORMAL HIGH (ref 65–99)
Glucose-Capillary: 249 mg/dL — ABNORMAL HIGH (ref 65–99)
Glucose-Capillary: 255 mg/dL — ABNORMAL HIGH (ref 65–99)
Glucose-Capillary: 262 mg/dL — ABNORMAL HIGH (ref 65–99)
Glucose-Capillary: 316 mg/dL — ABNORMAL HIGH (ref 65–99)
Glucose-Capillary: 424 mg/dL — ABNORMAL HIGH (ref 65–99)
Glucose-Capillary: 90 mg/dL (ref 65–99)

## 2016-05-02 LAB — PROTIME-INR
INR: 5.85 (ref 0.00–1.49)
Prothrombin Time: 50.6 seconds — ABNORMAL HIGH (ref 11.6–15.2)

## 2016-05-02 LAB — CBC WITH DIFFERENTIAL/PLATELET
BASOS ABS: 0 10*3/uL (ref 0.0–0.1)
Basophils Relative: 0 %
EOS ABS: 0 10*3/uL (ref 0.0–0.7)
Eosinophils Relative: 0 %
HCT: 26.6 % — ABNORMAL LOW (ref 36.0–46.0)
HEMOGLOBIN: 8.7 g/dL — AB (ref 12.0–15.0)
LYMPHS ABS: 2.5 10*3/uL (ref 0.7–4.0)
LYMPHS PCT: 19 %
MCH: 26.6 pg (ref 26.0–34.0)
MCHC: 32.7 g/dL (ref 30.0–36.0)
MCV: 81.3 fL (ref 78.0–100.0)
Monocytes Absolute: 0.9 10*3/uL (ref 0.1–1.0)
Monocytes Relative: 7 %
NEUTROS PCT: 74 %
Neutro Abs: 9.7 10*3/uL — ABNORMAL HIGH (ref 1.7–7.7)
PLATELETS: 267 10*3/uL (ref 150–400)
RBC: 3.27 MIL/uL — AB (ref 3.87–5.11)
RDW: 16.3 % — ABNORMAL HIGH (ref 11.5–15.5)
WBC: 13.1 10*3/uL — AB (ref 4.0–10.5)

## 2016-05-02 LAB — PHOSPHORUS
PHOSPHORUS: 1.7 mg/dL — AB (ref 2.5–4.6)
Phosphorus: 2.7 mg/dL (ref 2.5–4.6)
Phosphorus: 3.9 mg/dL (ref 2.5–4.6)

## 2016-05-02 LAB — MAGNESIUM
MAGNESIUM: 1.8 mg/dL (ref 1.7–2.4)
Magnesium: 2 mg/dL (ref 1.7–2.4)
Magnesium: 1.9 mg/dL (ref 1.7–2.4)

## 2016-05-02 LAB — TROPONIN I: Troponin I: <0.03 ng/mL (ref <0.031)

## 2016-05-02 LAB — PROCALCITONIN: PROCALCITONIN: 5.12 ng/mL

## 2016-05-02 MED ORDER — DEXTROSE 10 % IV SOLN: INTRAVENOUS | Status: DC | PRN

## 2016-05-02 MED ORDER — INSULIN GLARGINE 100 UNIT/ML ~~LOC~~ SOLN
30.0000 [IU] | Freq: Every day | SUBCUTANEOUS | Status: DC
  Administered 2016-05-02 – 2016-05-03 (×2): 30 [IU] via SUBCUTANEOUS
  Filled 2016-05-02 (×2): qty 0.3

## 2016-05-02 MED ORDER — POTASSIUM CHLORIDE 10 MEQ/50ML IV SOLN
10.0000 meq | INTRAVENOUS | Status: AC
  Administered 2016-05-02 (×6): 10 meq via INTRAVENOUS
  Filled 2016-05-02 (×6): qty 50

## 2016-05-02 MED ORDER — FENTANYL CITRATE (PF) 100 MCG/2ML IJ SOLN: 12.5000 ug | INTRAMUSCULAR | Status: DC | PRN

## 2016-05-02 MED ORDER — POTASSIUM PHOSPHATES 15 MMOLE/5ML IV SOLN
30.0000 mmol | Freq: Once | INTRAVENOUS | Status: AC
  Administered 2016-05-02: 30 mmol via INTRAVENOUS
  Filled 2016-05-02: qty 10

## 2016-05-02 MED ORDER — CETYLPYRIDINIUM CHLORIDE 0.05 % MT LIQD: 7.0000 mL | Freq: Two times a day (BID) | OROMUCOSAL | Status: DC

## 2016-05-02 MED ORDER — INSULIN ASPART 100 UNIT/ML ~~LOC~~ SOLN
2.0000 [IU] | SUBCUTANEOUS | Status: DC
  Administered 2016-05-03: 2 [IU] via SUBCUTANEOUS

## 2016-05-02 MED ORDER — MIDAZOLAM HCL 2 MG/2ML IJ SOLN: 1.0000 mg | INTRAMUSCULAR | Status: DC | PRN

## 2016-05-02 MED ORDER — VITAMIN K1 10 MG/ML IJ SOLN
10.0000 mg | Freq: Once | INTRAVENOUS | Status: AC
  Administered 2016-05-02: 10 mg via INTRAVENOUS
  Filled 2016-05-02: qty 1

## 2016-05-02 MED ORDER — DEXTROSE 50 % IV SOLN
25.0000 mL | Freq: Once | INTRAVENOUS | Status: AC
  Administered 2016-05-02: 25 mL via INTRAVENOUS

## 2016-05-02 MED ORDER — SODIUM CHLORIDE 0.9 % IV SOLN
INTRAVENOUS | Status: AC
  Filled 2016-05-02: qty 2.5

## 2016-05-02 MED ORDER — DEXTROSE 50 % IV SOLN
INTRAVENOUS | Status: AC
  Filled 2016-05-02: qty 50

## 2016-05-02 MED ORDER — CHLORHEXIDINE GLUCONATE 0.12 % MT SOLN
15.0000 mL | Freq: Two times a day (BID) | OROMUCOSAL | Status: DC
  Administered 2016-05-03: 15 mL via OROMUCOSAL
  Filled 2016-05-02: qty 15

## 2016-05-02 NOTE — Progress Notes (Signed)
  Echocardiogram 2D Echocardiogram has been performed.  Madison [name] M 05/02/2016, 10:04 AM

## 2016-05-02 NOTE — Progress Notes (Addendum)
Inpatient Diabetes Program Recommendations  AACE/ADA: New Consensus Statement on Inpatient Glycemic Control (2015)  Target Ranges:  Prepandial:   less than 140 mg/dL      Peak postprandial:   less than 180 mg/dL (1-2 hours)      Critically ill patients:  140 - 180 mg/dL   Results for Madison [name], Madison [name] (MRN [id number]) as of 05/02/2016 10:08  Ref. Range 05/01/2016 22:04 05/01/2016 23:10 05/02/2016 00:16 05/02/2016 01:02 05/02/2016 02:00 05/02/2016 03:03 05/02/2016 04:04 05/02/2016 05:01 05/02/2016 06:00  Glucose-Capillary Latest Ref Range: 65-99 mg/dL 818 (H) 299 (H) 371 (H) 255 (H) 191 (H) 161 (H) 121 (H) 110 (H) 132 (H)    Review of Glycemic Control  Diabetes history: DM  Outpatient Diabetes medications: Levemir 30 units daily (just increased 6/12 from 25 units daily)+ Novolog correction scale CBG 70 - 120: 0 units  CBG 121 - 150: 2 unit  CBG 151 - 200: 3 units  CBG 201 - 250: 5 units  CBG 251 - 300: 6units  CBG 301 - 350: 7units  CBG 351 - 400: 10 units  CBG > 400: call MD. + ? Novolog 5 units tid meal coverage Current orders for Inpatient glycemic control: IV insulin drip via glucostabilizer per DKA protocol.  Inpatient Diabetes Program Recommendations:  Noted patient in DKA via lab work  5 :15 pm yesterday. Patient did not receive basal insulin and Novolog coverage prior to discontinuation of IV insulin drip. When patient ready for transition from IV insulin drip, please note patient will need to be given basal insulin (80% of home insulin dose = 24 units) 2 hrs. prior to IV drip being discontinued and cover CBG when the insulin drip is discontinued with Novolog correction scale. Will follow patient in hospitalization.  11:10pm Paged and returned call to Donia Pounds, [facility] which has been following patient @ home approx. 1 week. States patient may be taking her Levemir insulin once daily with assistance to dial dose by personal care worker. Patient does not have family members who  live close enough to assist with administration of insulin or care. When patient evaluated by [facility] for return demonstration of insulin by pen administration, patient has not been able to do. Edwina plans to speak to case manager also.  Thank you for update!  Thank you, Billy Fischer. Aarohi Redditt, RN, MSN, CDE Inpatient Glycemic Control Team Team Pager [phone] (8am-5pm) 05/02/2016 10:29 AM

## 2016-05-02 NOTE — Progress Notes (Signed)
PULMONARY / CRITICAL CARE MEDICINE   Name: Madison [name] MRN: [id number] DOB: 1961/07/24    ADMISSION DATE:  04/30/2016 CONSULTATION DATE:  05/01/2016  REFERRING MD:  Kittie Plater, M.D. / EDP  CHIEF COMPLAINT:  DKA  HISTORY OF PRESENT ILLNESS:  Patient known to our service from previous hospitalization early last month with diabetic ketoacidosis. Per hospital record patient presented with altered mental status found down today by her roommate who is a home health aid. Patient was noted to have rapid shallow breathing as well as diabetic ketoacidosis. Patient was found unresponsive and posturing. Eyes were deviated to decide upon EMS arrival. After arrival in the emergency department she was intubated. Patient developed hypotension postintubation on propofol therefore propofol infusion was stopped. Upon further review the patient's electronic medical record during office visit on 6/12 she had reported her blood glucose continued to be in the 500s despite a regimen of Levemir 25 units and 5 units of NovoLog before meals. Patient had reported eating normally and denied any weakness, falls, "feeling acutely ill", or hypoglycemia. Patient was noted to have sinus tachycardia during that visit on EKG. Levemir was increased to 30 units and patient was referred to endocrinology. Patient declined to go to the emergency department at that time.She was intubated in the emergency department for airway protection.    SUBJECTIVE: Intubated following commands and tolerating SBT well.   VITAL SIGNS: BP 138/65 mmHg  Pulse 75  Temp(Src) 98.2 F (36.8 C) (Oral)  Resp 13  Ht 5\' 3"  (1.6 m)  Wt 57.7 kg (127 lb 3.3 oz)  BMI 22.54 kg/m2  SpO2 100%  HEMODYNAMICS:    VENTILATOR SETTINGS: Vent Mode:  [-] CPAP;PSV FiO2 (%):  [45 %-50 %] 45 % Set Rate:  [30 bmp] 30 bmp Vt Set:  [410 mL] 410 mL PEEP:  [5 cmH20] 5 cmH20 Pressure Support:  [10 cmH20] 10 cmH20 Plateau Pressure:  [10 cmH20-17 cmH20] 17  cmH20  INTAKE / OUTPUT: I/O last 3 completed shifts: In: [I.V.:10338.5; NG/GT:140; IV Piggyback:972.5] Out: 3707 [Urine:3707]  PHYSICAL EXAMINATION: General:  Female of normal body habitus in NAD on vent HEENT:  Dry mucus membranes. Endotracheal tube in place.  Cardiovascular:  Tachycardic. No edema. No appreciable JVD.  Pulmonary:  Clear bilaterally to auscultation. Symmetric chest wall rise on ventilator.Scattered rhonchi Abdomen: Soft. Normal bowel sounds. Nondistended.  Musculoskeletal:  Normal bulk and tone. No joint deformity or effusion appreciated. Neurological: Alert, follows commands, but she is somnolent.    LABS:  BMET  Recent Labs Lab 05/02/16 0100 05/02/16 0530 05/02/16 0800  NA 137 137 134*  K 2.7* 3.1* 3.0*  CL 103 101 99*  CO2 15* 25 23  BUN 8 6 6   CREATININE 1.53* 0.99 1.00  GLUCOSE 257* 106* 238*    Electrolytes  Recent Labs Lab 05/01/16 1715  05/02/16 0100 05/02/16 0530 05/02/16 0800  CALCIUM 7.5*  < > 7.7* 7.5* 7.3*  MG 2.0  --  2.0  --  1.8  PHOS 3.5  --  2.7  --  1.7*  < > = values in this interval not displayed.  CBC  Recent Labs Lab 05/01/16 0500 05/01/16 1715 05/02/16 0530  WBC 27.6* 15.5* 13.1*  HGB 9.3* 8.7* 8.7*  HCT 30.1* 26.7* 26.6*  PLT 396 288 267    Coag's  Recent Labs Lab 04/30/16 2250 05/01/16 0500 05/02/16 0530  INR 5.44* 4.69* 5.85*    Sepsis Markers  Recent Labs Lab 05/01/16 0155 05/01/16 0900 05/01/16 1717  05/02/16 0530  LATICACIDVEN 4.1* 4.4* 1.3  --   PROCALCITON 2.49  --   --  5.12    ABG  Recent Labs Lab 04/30/16 2254 05/01/16 1304  PHART 6.828* 7.549*  PCO2ART 22.9* 26.9*  PO2ART 618.0* 215.0*    Liver Enzymes  Recent Labs Lab 05/01/16 0155  AST 39  ALT 20  ALKPHOS 123  BILITOT 1.5*  ALBUMIN 2.7*    Cardiac Enzymes  Recent Labs Lab 05/01/16 1239 05/01/16 2000 05/02/16 0100  TROPONINI <0.03 <0.03 <0.03    Glucose  Recent Labs Lab 05/02/16 0102  05/02/16 0200 05/02/16 0303 05/02/16 0404 05/02/16 0501 05/02/16 0600  GLUCAP 255* 191* 161* 121* 110* 132*    Imaging Dg Chest Port 1 View  05/02/2016  CLINICAL DATA:  Hypoxia EXAM: PORTABLE CHEST 1 VIEW COMPARISON:  April 30, 2016 FINDINGS: Endotracheal tube tip is in the right main bronchus. Nasogastric tube tip and side port are in the stomach. No pneumothorax. There is no edema or consolidation. Heart size and pulmonary vascular normal. No adenopathy. IMPRESSION: Endotracheal tube tip in proximal right main bronchus. Advise withdrawing endotracheal tube approximately 4 to 4.5 cm. No pneumothorax. No edema or consolidation. Critical Value/emergent results were called by telephone at the time of interpretation on 05/02/2016 at 7:01 am to Larey Seat, RN, who verbally acknowledged these results. Electronically Signed   By: Bretta Bang III M.D.   On: 05/02/2016 07:02    STUDIES:  CT HEAD W/O 6/12: Mild to moderate cortical volume loss. Large chronic infarcts noted in right frontal, parietal, and occipital lobes. Large chronic infarcts also noted at the left parietal and occipital lobes with associated encephalomalacia. Diffuse periventricular & subcortical white matter change reflecting small vessel ischemic microangiopathy. Also noted are small chronic infarcts in cerebellar hemispheres bilaterally more prominent on the left. No mass effect or midline shift. No evidence of fracture.  PORT CXR 6/12: No focal opacity or effusion. Endotracheal tube in acceptable position. Enteric tube with tip below the diaphragm.  MICROBIOLOGY: MRSA PCR 6/13 >>Neg Blood Ctx x2 6/13 >> Tracheal Asp Ctx 6/13 >> Urine Ctx 6/12 >> neg  ANTIBIOTICS: Vancomycin 6/13 >> Zosyn 6/13 >>  SIGNIFICANT EVENTS: 4/29 - 5/02 - Admit w/ DKA 5/08 - 5/16 - Admit w/ DKA 6/13 - Admit with DKA  LINES/TUBES: OETT 7.0 6/12 >> R Femoral CVL 6/13 (EDP) >> Foley 6/12 >> OGT 6/12 >> R & L EJ IV 6/12 >>  DISCUSSION:   55 year old female with known history of type 1 diabetes mellitus as well as aortic valve replacement on chronic systemic anticoagulation with Coumadin. Patient has had multiple repeated admissions for diabetic ketoacidosis. Presents after being found down today for an unknown period of time but after her primary care physician appointment today. Patient has multisystem organ failure with acute encephalopathy, shock, acute renal failure, & diabetic ketoacidosis. I had a lengthy discussion with the patient's sister via phone. Reportedly the patient has a son in [place] that she will attempt to contact. At this time the patient's sister reports she would not wish to undergo resuscitation in the event of cardiac arrest or arrhythmia. I asked this question of her twice to ensure that I was clear on her response. We will continue treatment of her DKA and correction of her electrolyte abnormalities while investigating the potential cause for such an acute decompensation.  ASSESSMENT / PLAN:  ENDOCRINE A:   DKA - Just admitted & discharged w/ DKA in May. Beta-hydroxybutyrate > 8 on  admit.  H/O DM Type I - Poorly controlled.   P:   Holding home Levemir Will give lantus and stop insulin gtt per DKA protocol as gap is closed. IVF hydration  PULMONARY A: Acute Respiratory Failure - Secondary to altered mental status.  P:   Full Vent Support Tolerating wean well, hopeful for extubation today.  Ensure ETT retracted if unable to extubate Pulmonary hygeine  CARDIOVASCULAR A:  Shock - Likely hypovolemia versus sepsis. S/P Aortic Valve Replacement Chronic Systolic CHF H/O CAD - S/P CABG, Hyperlipidemia  P:  Monitor on telemetry Holding Coumadin Heparin drip per pharmacy protocol once INR <2.0 Holding home Pravachol Neo-Synephrine to maintain MAP >65 ( now) Echo pending  RENAL A:   Metabolic Acidosis 2/2 DKA: resolved, GAP has normalized to 12 with last BMET Acute Renal Failure - Likely  due to dehydration. Hypophosphoremia >>repleted 6/13  P:   Trending electrolytes per DKA protocol Monitoring UOP with Foley/ Strict I&O Replacing electrolytes as indicated (K and Phos needed 6/14) Dc bicarb gtt  GASTROINTESTINAL A:   No acute issues. H/O GERD LFT's WNL 6/13  P:   NPO Pepcid IV daily  HEMATOLOGIC A:   Anemia - No obvious signs of active bleeding. Slightly worse than baseline. Warfarin coagulopathy (s/p AVR)  P:  Trending cell count daily w/ CBC Daily Coags Vitamin K 6/14 Transfuse per ICU guidelines Heparin per pharmacy once INR down (5 today)  INFECTIOUS A:   SIRS - likely in setting DKA  P:   Empiric Vancomycin & Zosyn Day #2 PCT per algorithm > PCT climbing 6/14 Follow cultures Low threshold to DC abx  NEUROLOGIC A:   Acute Encephalopathy - Likely metabolic. Gaze Deviation on admit - No reported witnessed seizures, EEG negative 6/14 H/O CVA - Multiple bilateral on CT head. H/O Depression/Anxiety  P:   RASS goal: 0 to -1 Holding home Celexa & Trazodone. WUA  FAMILY  - Updates: No family at bedside 6/14. Sister [name] updated via phone by Dr. Jamison Neighbor 6/13.  - Inter-disciplinary family meet or Palliative Care meeting due by:  6/20   Joneen Roach, AGACNP-BC [place] [facility] Pager [phone] or [phone]  05/02/2016 10:28 AM  Tolerating SBT.  Weaning pressors.  No wheeze, HR regular, Abd soft.  CXR - no ASD  Assessment/plan:  Acute resp failure, hypoxia. - extubated  DKA with elevated blood sugars. - insulin gtt  Sepsis. - continue Abx  CC time by me independent of APP time 31 minutes.  Coralyn Helling, MD [facility] Pulmonary/Critical Care 05/02/2016, 10:53 AM Pager:  [phone] After 3pm call: [phone]

## 2016-05-02 NOTE — Care Management Important Message (Signed)
Important Message  Patient Details  Name: CHAMAINE STANKUS MRN: [id number] Date of Birth: 16-Jan-1961   Medicare Important Message Given:  Yes    Bernadette Hoit 05/02/2016, 8:07 AM

## 2016-05-02 NOTE — Progress Notes (Signed)
eLink Physician-Brief Progress Note Patient Name: NIOKA THORINGTON DOB: Dec 14, 1960 MRN: [id number]   Date of Service  05/02/2016  HPI/Events of Note  Multiple issues: 1. K+ = 2.7 and Creatinine = 1.53 and 2. Troponin < 0.03 X 6.   eICU Interventions  Will order: 1. Replete K+. 2. D/C further Troponins.     Intervention Category Intermediate Interventions: Electrolyte abnormality - evaluation and management;Diagnostic test evaluation  Lenell Antu 05/02/2016, 1:56 AM

## 2016-05-02 NOTE — Procedures (Signed)
Extubation Procedure Note  Patient Details:   Name: Madison [name] DOB: 1961/05/16 MRN: [id number]   Airway Documentation:     Evaluation  O2 sats: stable throughout Complications: No apparent complications Patient did tolerate procedure well. Bilateral Breath Sounds: Coarse crackles, Diminished   Yes  Dairl Ponder Nannette 05/02/2016, 11:12 AM   E-link notified.

## 2016-05-03 LAB — GLUCOSE, CAPILLARY
GLUCOSE-CAPILLARY: 96 mg/dL (ref 65–99)
GLUCOSE-CAPILLARY: 150 mg/dL — AB (ref 65–99)
GLUCOSE-CAPILLARY: 108 mg/dL — AB (ref 65–99)
Glucose-Capillary: 118 mg/dL — ABNORMAL HIGH (ref 65–99)
Glucose-Capillary: 93 mg/dL (ref 65–99)

## 2016-05-03 LAB — BASIC METABOLIC PANEL
ANION GAP: 6 (ref 5–15)
BUN: <5 mg/dL — ABNORMAL LOW (ref 6–20)
CHLORIDE: 105 mmol/L (ref 101–111)
CO2: 28 mmol/L (ref 22–32)
Calcium: 7.6 mg/dL — ABNORMAL LOW (ref 8.9–10.3)
Creatinine, Ser: 0.69 mg/dL (ref 0.44–1.00)
GFR calc non Af Amer: >60 mL/min (ref >60)
Glucose, Bld: 104 mg/dL — ABNORMAL HIGH (ref 65–99)
POTASSIUM: 2.9 mmol/L — AB (ref 3.5–5.1)
Sodium: 139 mmol/L (ref 135–145)

## 2016-05-03 LAB — CBC
HCT: 26.2 % — ABNORMAL LOW (ref 36.0–46.0)
HEMOGLOBIN: 8.4 g/dL — AB (ref 12.0–15.0)
MCH: 27 pg (ref 26.0–34.0)
MCHC: 32.1 g/dL (ref 30.0–36.0)
MCV: 84.2 fL (ref 78.0–100.0)
Platelets: 220 10*3/uL (ref 150–400)
RBC: 3.11 MIL/uL — AB (ref 3.87–5.11)
RDW: 16.7 % — ABNORMAL HIGH (ref 11.5–15.5)
WBC: 9.4 10*3/uL (ref 4.0–10.5)

## 2016-05-03 LAB — URINE CULTURE: CULTURE: NO GROWTH

## 2016-05-03 LAB — CULTURE, RESPIRATORY

## 2016-05-03 LAB — PROCALCITONIN: Procalcitonin: 2.54 ng/mL

## 2016-05-03 LAB — CULTURE, RESPIRATORY W GRAM STAIN
Culture: NO GROWTH
Special Requests: NORMAL

## 2016-05-03 LAB — PROTIME-INR
INR: 1.42 (ref 0.00–1.49)
Prothrombin Time: 17.4 seconds — ABNORMAL HIGH (ref 11.6–15.2)

## 2016-05-03 MED ORDER — INSULIN ASPART 100 UNIT/ML ~~LOC~~ SOLN: 0.0000 [IU] | Freq: Every day | SUBCUTANEOUS | Status: DC

## 2016-05-03 MED ORDER — WARFARIN SODIUM 7.5 MG PO TABS
7.5000 mg | ORAL_TABLET | Freq: Once | ORAL | Status: AC
Start: 2016-05-03 — End: 2016-05-03
  Administered 2016-05-03: 7.5 mg via ORAL
  Filled 2016-05-03: qty 1

## 2016-05-03 MED ORDER — ACETAMINOPHEN 325 MG PO TABS: 650.0000 mg | ORAL_TABLET | Freq: Four times a day (QID) | ORAL | Status: DC | PRN

## 2016-05-03 MED ORDER — WARFARIN - PHARMACIST DOSING INPATIENT
Freq: Every day | Status: DC
  Administered 2016-05-04: 18:00:00

## 2016-05-03 MED ORDER — INSULIN ASPART 100 UNIT/ML ~~LOC~~ SOLN
0.0000 [IU] | Freq: Three times a day (TID) | SUBCUTANEOUS | Status: DC
  Administered 2016-05-04: 3 [IU] via SUBCUTANEOUS

## 2016-05-03 MED ORDER — CITALOPRAM HYDROBROMIDE 40 MG PO TABS
40.0000 mg | ORAL_TABLET | Freq: Every day | ORAL | Status: DC
  Administered 2016-05-03 – 2016-05-05 (×3): 40 mg via ORAL
  Filled 2016-05-03 (×2): qty 2
  Filled 2016-05-03: qty 1

## 2016-05-03 MED ORDER — INSULIN ASPART 100 UNIT/ML ~~LOC~~ SOLN
4.0000 [IU] | Freq: Three times a day (TID) | SUBCUTANEOUS | Status: DC
  Administered 2016-05-04: 4 [IU] via SUBCUTANEOUS

## 2016-05-03 MED ORDER — POTASSIUM CHLORIDE 10 MEQ/50ML IV SOLN
10.0000 meq | INTRAVENOUS | Status: AC
  Administered 2016-05-03 (×6): 10 meq via INTRAVENOUS
  Filled 2016-05-03 (×6): qty 50

## 2016-05-03 MED ORDER — PANTOPRAZOLE SODIUM 40 MG PO TBEC
40.0000 mg | DELAYED_RELEASE_TABLET | Freq: Every day | ORAL | Status: DC
  Administered 2016-05-03 – 2016-05-08 (×6): 40 mg via ORAL
  Filled 2016-05-03 (×6): qty 1

## 2016-05-03 MED ORDER — TRAZODONE HCL 50 MG PO TABS
50.0000 mg | ORAL_TABLET | Freq: Every day | ORAL | Status: DC
  Administered 2016-05-03 – 2016-05-07 (×5): 50 mg via ORAL
  Filled 2016-05-03 (×5): qty 1

## 2016-05-03 NOTE — Progress Notes (Signed)
CRITICAL VALUE ALERT  Critical value received:  Potassium 2.9  Date of notification:  05/03/16  Time of notification:  Was not notified  MD notified (1st page):  Nicholos Johns  Received orders for 6 runs of K+ IV for central line. Will continue to monitor.

## 2016-05-03 NOTE — Progress Notes (Signed)
PULMONARY / CRITICAL CARE MEDICINE   Name: Madison [name] MRN: [id number] DOB: 05-04-1961    ADMISSION DATE:  04/30/2016  REFERRING MD:  Kittie Plater, M.D. / EDP  CHIEF COMPLAINT:  DKA  SUBJECTIVE:  Denies chest pain, dyspnea, abdominal pain.  Feels hungry.  VITAL SIGNS: BP 112/53 mmHg  Pulse 61  Temp(Src) 97.9 F (36.6 C) (Oral)  Resp 19  Ht 5\' 3"  (1.6 m)  Wt 125 lb 14.1 oz (57.1 kg)  BMI 22.30 kg/m2  SpO2 97%  INTAKE / OUTPUT: I/O last 3 completed shifts: In: 7095.1 [P.O.:60; I.V.:5582.6; IV Piggyback:1452.5] Out: 2829 [Urine:2829]  PHYSICAL EXAMINATION: General: awake Neuro: follows commands, moves all extremities Cardiac: regular Chest: no wheeze Abd: soft, non tender Ext: no edema Skin: no rashes  LABS:  BMET  Recent Labs Lab 05/02/16 0800 05/02/16 1606 05/03/16 0315  NA 134* 136 139  K 3.0* 3.6 2.9*  CL 99* 101 105  CO2 23 28 28   BUN 6 <5* <5*  CREATININE 1.00 0.79 0.69  GLUCOSE 238* 118* 104*    Electrolytes  Recent Labs Lab 05/02/16 0100  05/02/16 0800 05/02/16 1606 05/03/16 0315  CALCIUM 7.7*  < > 7.3* 7.4* 7.6*  MG 2.0  --  1.8 1.9  --   PHOS 2.7  --  1.7* 3.9  --   < > = values in this interval not displayed.  CBC  Recent Labs Lab 05/01/16 1715 05/02/16 0530 05/03/16 0315  WBC 15.5* 13.1* 9.4  HGB 8.7* 8.7* 8.4*  HCT 26.7* 26.6* 26.2*  PLT 288 267 220    Coag's  Recent Labs Lab 05/01/16 0500 05/02/16 0530 05/03/16 0315  INR 4.69* 5.85* 1.42    Sepsis Markers  Recent Labs Lab 05/01/16 0155 05/01/16 0900 05/01/16 1717 05/02/16 0530 05/03/16 0315  LATICACIDVEN 4.1* 4.4* 1.3  --   --   PROCALCITON 2.49  --   --  5.12 2.54    ABG  Recent Labs Lab 04/30/16 2254 04/30/16 2344 05/01/16 1304  PHART 6.828* 6.800* 7.549*  PCO2ART 22.9* 18.8* 26.9*  PO2ART 618.0* 127* 215.0*    Liver Enzymes  Recent Labs Lab 05/01/16 0155  AST 39  ALT 20  ALKPHOS 123  BILITOT 1.5*  ALBUMIN 2.7*    Cardiac  Enzymes  Recent Labs Lab 05/01/16 1239 05/01/16 2000 05/02/16 0100  TROPONINI <0.03 <0.03 <0.03    Glucose  Recent Labs Lab 05/02/16 1939 05/02/16 2020 05/02/16 2329 05/02/16 2354 05/03/16 0315 05/03/16 0756  GLUCAP 55* 90 57* 120* 96 150*    Imaging No results found.  STUDIES:  6/12 CT head >> chronic infarcts 6/13 EEG >> diffuse slowing 6/14 Echo >> EF 60 to 65%, grade 1 diastolic dysfx  MICROBIOLOGY: 7/13 Urine >> negative 6/13 Sputum >>  6/13 Blood >>   ANTIBIOTICS: 6/12 Vancomycin >> 6/15 6/12 Zosyn >>   SIGNIFICANT EVENTS: 4/29 - 5/02 - Admit w/ DKA 5/08 - 5/16 - Admit w/ DKA 6/13 - Admit with DKA  LINES/TUBES: 6/12 ETT >> 6/14 6/13 Rt Femoral CVL >> 6/15  DISCUSSION:   55 yo female with DKA, septic shock, VDRF.  ASSESSMENT / PLAN:  ENDOCRINE A:   DKA. P:   SSI with lantus  PULMONARY A: Acute Respiratory Failure - Secondary to altered mental status. P:   Monitor respiratory status  CARDIOVASCULAR A:  Septic, hypovolemic shock. Hx of AVR on coumadin, CAD s/p CABG, HTN. P:  Monitor hemodynamics Coumadin per pharmacy and then transition off heparin gtt  Wean of phenylephrine to keep SBP > 90 D/c femoral CVL  RENAL A:   AKI >> resolved. Hypokalemia, hypophosphatemia, hypomagnesemia. P:   Monitor renal fx, urine outpt Replace electrolytes as needed  GASTROINTESTINAL A:   Nutrition. Hx of GERD. P:   Mod. Carb diet Protonix  HEMATOLOGIC A:   Anemia of critical illness and chronic disease. P:  F/u CBC  INFECTIOUS A:   Septic shock >> unclear source. P:   Day 4 of zosyn D/c Vancomycin  NEUROLOGIC A:   Acute metabolic encephalopathy. Hx of CVA, depression, anxiety. Deconditioning. P:   Resume Celexa & Trazodone PT assessment  Coralyn Helling, MD [facility] Pulmonary/Critical Care 05/03/2016, 11:54 AM Pager:  [phone] After 3pm call: [phone]

## 2016-05-03 NOTE — Progress Notes (Signed)
ANTICOAGULATION CONSULT NOTE - Initial Consult  Pharmacy Consult for warfarin Indication: AVR  Allergies  Allergen Reactions  . Adhesive [Tape] Other (See Comments)    Can burn skin if left on too long  . Celebrex [Celecoxib] Rash  . Detrol [Tolterodine] Hives    Patient Measurements: Height: 5\' 3"  (160 cm) Weight: 125 lb 14.1 oz (57.1 kg) IBW/kg (Calculated) : 52.4  Vital Signs: Temp: 98 F (36.7 C) (06/15 1143) Temp Source: Oral (06/15 1143) BP: 107/55 mmHg (06/15 1300) Pulse Rate: 73 (06/15 1300)  Labs:  Recent Labs  05/01/16 0155  05/01/16 0500  05/01/16 1239 05/01/16 1715 05/01/16 2000  05/02/16 0100 05/02/16 0530 05/02/16 0800 05/02/16 1606 05/03/16 0315  HGB  --   < > 9.3*  --   --  8.7*  --   --   --  8.7*  --   --  8.4*  HCT  --   < > 30.1*  --   --  26.7*  --   --   --  26.6*  --   --  26.2*  PLT  --   --  396  --   --  288  --   --   --  267  --   --  220  LABPROT  --   --  42.8*  --   --   --   --   --   --  50.6*  --   --  17.4*  INR  --   --  4.69*  --   --   --   --   --   --  5.85*  --   --  1.42  CREATININE 1.80*  < > 1.67*  < > 0.94 1.12*  --   < > 1.53* 0.99 1.00 0.79 0.69  CKTOTAL 23*  --   --   --   --   --   --   --   --   --   --   --   --   TROPONINI <0.03  --   --   < > <0.03  --  <0.03  --  <0.03  --   --   --   --   < > = values in this interval not displayed.  Estimated Creatinine Clearance: 65.7 mL/min (by C-G formula based on Cr of 0.69).   Medical History: Past Medical History  Diagnosis Date  . CAD (coronary artery disease)   . Obesity   . Hypercholesteremia   . HTN (hypertension)   . Dyslipidemia   . Aortic stenosis   . Thrombophlebitis   . Heart murmur   . Myocardial infarction (HCC) 12/2014  . Pneumonia 11/2014; 12/2014  . GERD (gastroesophageal reflux disease)   . 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 Anxiety   . Depression   . Allergy   .  CHF (congestive heart failure) (HCC)   . Rheumatoid arthritis(714.0)     "hands" (07/01/2015)  . Diabetes (HCC) dx'd 1982    "went straight to insulin"    Medications:  See EMR  Assessment: On long term anticoagulation for hx of AVR. Pt was admitted with INR of 5.4. She rec'd Vitamin K 10 mg x1 yesterday, INR now down to 1.4. I discussed bridging with MD, does not want a bridge at this time. Will reassess in AM. On warfarin PTA: 7.5  mg po daily  Goal of Therapy:  INR 2.5-3.5  Monitor platelets by anticoagulation protocol: Yes   Plan:  -warfarin 7.5 mg po x1 -daily INR  Baldemar Friday 05/03/2016,1:58 PM

## 2016-05-04 ENCOUNTER — Encounter (HOSPITAL_COMMUNITY): Payer: Self-pay | Admitting: *Deleted

## 2016-05-04 LAB — GLUCOSE, CAPILLARY
GLUCOSE-CAPILLARY: 42 mg/dL — AB (ref 65–99)
GLUCOSE-CAPILLARY: 70 mg/dL (ref 65–99)
GLUCOSE-CAPILLARY: 37 mg/dL — AB (ref 65–99)
GLUCOSE-CAPILLARY: 78 mg/dL (ref 65–99)
GLUCOSE-CAPILLARY: 109 mg/dL — AB (ref 65–99)
GLUCOSE-CAPILLARY: 79 mg/dL (ref 65–99)
Glucose-Capillary: 37 mg/dL — CL (ref 65–99)
Glucose-Capillary: 194 mg/dL — ABNORMAL HIGH (ref 65–99)
Glucose-Capillary: 32 mg/dL — CL (ref 65–99)
Glucose-Capillary: 35 mg/dL — CL (ref 65–99)
Glucose-Capillary: 57 mg/dL — ABNORMAL LOW (ref 65–99)
Glucose-Capillary: 103 mg/dL — ABNORMAL HIGH (ref 65–99)

## 2016-05-04 LAB — BASIC METABOLIC PANEL
ANION GAP: 4 — AB (ref 5–15)
BUN: <5 mg/dL — ABNORMAL LOW (ref 6–20)
CHLORIDE: 113 mmol/L — AB (ref 101–111)
CO2: 26 mmol/L (ref 22–32)
Calcium: 8.3 mg/dL — ABNORMAL LOW (ref 8.9–10.3)
Creatinine, Ser: 0.68 mg/dL (ref 0.44–1.00)
GFR calc non Af Amer: >60 mL/min (ref >60)
Glucose, Bld: 32 mg/dL — CL (ref 65–99)
Potassium: 3.7 mmol/L (ref 3.5–5.1)
Sodium: 143 mmol/L (ref 135–145)

## 2016-05-04 LAB — CBC
HEMATOCRIT: 27.3 % — AB (ref 36.0–46.0)
HEMOGLOBIN: 8.6 g/dL — AB (ref 12.0–15.0)
MCH: 27.4 pg (ref 26.0–34.0)
MCHC: 31.5 g/dL (ref 30.0–36.0)
MCV: 86.9 fL (ref 78.0–100.0)
Platelets: 177 10*3/uL (ref 150–400)
RBC: 3.14 MIL/uL — ABNORMAL LOW (ref 3.87–5.11)
RDW: 16.7 % — ABNORMAL HIGH (ref 11.5–15.5)
WBC: 4.7 10*3/uL (ref 4.0–10.5)

## 2016-05-04 LAB — CULTURE, RESPIRATORY: SPECIAL REQUESTS: NORMAL

## 2016-05-04 LAB — PROTIME-INR
INR: 1.12 (ref 0.00–1.49)
Prothrombin Time: 14.6 seconds (ref 11.6–15.2)

## 2016-05-04 LAB — CULTURE, RESPIRATORY W GRAM STAIN: Culture: NORMAL

## 2016-05-04 LAB — PHOSPHORUS: Phosphorus: 2.7 mg/dL (ref 2.5–4.6)

## 2016-05-04 LAB — MAGNESIUM: Magnesium: 2 mg/dL (ref 1.7–2.4)

## 2016-05-04 LAB — GLUCOSE, RANDOM: GLUCOSE: 58 mg/dL — AB (ref 65–99)

## 2016-05-04 MED ORDER — INSULIN ASPART 100 UNIT/ML ~~LOC~~ SOLN
0.0000 [IU] | Freq: Three times a day (TID) | SUBCUTANEOUS | Status: DC
  Administered 2016-05-05 (×2): 3 [IU] via SUBCUTANEOUS
  Administered 2016-05-06: 5 [IU] via SUBCUTANEOUS
  Administered 2016-05-06: 7 [IU] via SUBCUTANEOUS
  Administered 2016-05-06: 2 [IU] via SUBCUTANEOUS
  Administered 2016-05-07: 9 [IU] via SUBCUTANEOUS
  Administered 2016-05-07: 7 [IU] via SUBCUTANEOUS

## 2016-05-04 MED ORDER — DEXTROSE-NACL 5-0.45 % IV SOLN
INTRAVENOUS | Status: DC
  Administered 2016-05-04 – 2016-05-05 (×2): via INTRAVENOUS

## 2016-05-04 MED ORDER — SODIUM CHLORIDE 0.9% FLUSH: 10.0000 mL | INTRAVENOUS | Status: DC | PRN

## 2016-05-04 MED ORDER — INSULIN DETEMIR 100 UNIT/ML ~~LOC~~ SOLN
20.0000 [IU] | Freq: Every day | SUBCUTANEOUS | Status: DC
  Administered 2016-05-04: 20 [IU] via SUBCUTANEOUS
  Filled 2016-05-04: qty 0.2

## 2016-05-04 MED ORDER — INSULIN ASPART 100 UNIT/ML ~~LOC~~ SOLN
0.0000 [IU] | Freq: Every day | SUBCUTANEOUS | Status: DC
  Administered 2016-05-06: 3 [IU] via SUBCUTANEOUS

## 2016-05-04 MED ORDER — SODIUM CHLORIDE 0.9% FLUSH: 10.0000 mL | Freq: Two times a day (BID) | INTRAVENOUS | Status: DC

## 2016-05-04 MED ORDER — INSULIN ASPART 100 UNIT/ML ~~LOC~~ SOLN: 2.0000 [IU] | Freq: Three times a day (TID) | SUBCUTANEOUS | Status: DC

## 2016-05-04 MED ORDER — WARFARIN SODIUM 7.5 MG PO TABS
7.5000 mg | ORAL_TABLET | Freq: Once | ORAL | Status: AC
  Administered 2016-05-04: 7.5 mg via ORAL
  Filled 2016-05-04: qty 1

## 2016-05-04 MED ORDER — SODIUM CHLORIDE 0.9 % IV SOLN
INTRAVENOUS | Status: DC
  Administered 2016-05-04: 11:00:00 via INTRAVENOUS

## 2016-05-04 MED ORDER — ENOXAPARIN SODIUM 60 MG/0.6ML ~~LOC~~ SOLN
60.0000 mg | Freq: Two times a day (BID) | SUBCUTANEOUS | Status: DC
  Administered 2016-05-04 – 2016-05-08 (×9): 60 mg via SUBCUTANEOUS
  Filled 2016-05-04 (×9): qty 0.6

## 2016-05-04 NOTE — Progress Notes (Signed)
PULMONARY / CRITICAL CARE MEDICINE   Name: SHARRA CAYABYAB MRN: [id number] DOB: Aug 02, 1961    ADMISSION DATE:  04/30/2016  REFERRING MD:  Kittie Plater, M.D. / EDP  CHIEF COMPLAINT:  DKA  SUBJECTIVE:  Denies chest pain, dyspnea, abdominal pain.  VITAL SIGNS: BP 89/56 mmHg  Pulse 63  Temp(Src) 97.7 F (36.5 C) (Oral)  Resp 22  Ht 5\' 3"  (1.6 m)  Wt 133 lb 2.5 oz (60.4 kg)  BMI 23.59 kg/m2  SpO2 100%  INTAKE / OUTPUT: I/O last 3 completed shifts: In: 3219.2 [P.O.:240; I.V.:2379.2; IV Piggyback:600] Out: 2326 [Urine:2325; Stool:1]  PHYSICAL EXAMINATION: General: awake Neuro: follows commands, moves all extremities Cardiac: regular Chest: no wheeze Abd: soft, non tender Ext: no edema Skin: no rashes  LABS:  BMET  Recent Labs Lab 05/02/16 1606 05/03/16 0315 05/04/16 0332  NA 136 139 143  K 3.6 2.9* 3.7  CL 101 105 113*  CO2 28 28 26   BUN <5* <5* <5*  CREATININE 0.79 0.69 0.68  GLUCOSE 118* 104* 32*    Electrolytes  Recent Labs Lab 05/02/16 0800 05/02/16 1606 05/03/16 0315 05/04/16 0332  CALCIUM 7.3* 7.4* 7.6* 8.3*  MG 1.8 1.9  --  2.0  PHOS 1.7* 3.9  --  2.7    CBC  Recent Labs Lab 05/02/16 0530 05/03/16 0315 05/04/16 0332  WBC 13.1* 9.4 4.7  HGB 8.7* 8.4* 8.6*  HCT 26.6* 26.2* 27.3*  PLT 267 220 177    Coag's  Recent Labs Lab 05/01/16 0500 05/02/16 0530 05/03/16 0315  INR 4.69* 5.85* 1.42    Sepsis Markers  Recent Labs Lab 05/01/16 0155 05/01/16 0900 05/01/16 1717 05/02/16 0530 05/03/16 0315  LATICACIDVEN 4.1* 4.4* 1.3  --   --   PROCALCITON 2.49  --   --  5.12 2.54    ABG  Recent Labs Lab 04/30/16 2254 04/30/16 2344 05/01/16 1304  PHART 6.828* 6.800* 7.549*  PCO2ART 22.9* 18.8* 26.9*  PO2ART 618.0* 127* 215.0*    Liver Enzymes  Recent Labs Lab 05/01/16 0155  AST 39  ALT 20  ALKPHOS 123  BILITOT 1.5*  ALBUMIN 2.7*    Cardiac Enzymes  Recent Labs Lab 05/01/16 1239 05/01/16 2000 05/02/16 0100   TROPONINI <0.03 <0.03 <0.03    Glucose  Recent Labs Lab 05/03/16 1527 05/03/16 2058 05/04/16 0537 05/04/16 0557 05/04/16 0634 05/04/16 0842  GLUCAP 118* 93 37* 42* 70 194*    Imaging No results found.  STUDIES:  6/12 CT head >> chronic infarcts 6/13 EEG >> diffuse slowing 6/14 Echo >> EF 60 to 65%, grade 1 diastolic dysfx  MICROBIOLOGY: 7/13 Urine >> negative 6/13 Sputum >>  6/13 Blood >>   ANTIBIOTICS: 6/12 Vancomycin >> 6/15 6/12 Zosyn >>   SIGNIFICANT EVENTS: 4/29 - 5/02 - Admit w/ DKA 5/08 - 5/16 - Admit w/ DKA 6/13 - Admit with DKA 6/16 - off pressors, transfer to tele  LINES/TUBES: 6/12 ETT >> 6/14 6/13 Rt Femoral CVL >> 6/15  DISCUSSION:   55 yo female with DKA, septic shock, VDRF.  ASSESSMENT / PLAN:  ENDOCRINE A:   DKA. P:   SSI - change to sensitive scale and meal coverage at 2 units Levemir 20 units daily   PULMONARY A: Acute Respiratory Failure - Secondary to altered mental status. P:   Monitor respiratory status  CARDIOVASCULAR A:  Septic, hypovolemic shock >> off pressors. Hx of AVR on coumadin, CAD s/p CABG, HTN. P:  Monitor hemodynamics Coumadin per pharmacy  RENAL  A:   AKI >> resolved. Hypokalemia, hypophosphatemia, hypomagnesemia. P:   Monitor renal fx, urine outpt Replace electrolytes as needed  GASTROINTESTINAL A:   Nutrition. Hx of GERD. P:   Mod. Carb diet Protonix  HEMATOLOGIC A:   Anemia of critical illness and chronic disease. P:  F/u CBC intermittently  INFECTIOUS A:   Septic shock >> unclear source. P:   Day 5/7 of zosyn  NEUROLOGIC A:   Acute metabolic encephalopathy. Hx of CVA, depression, anxiety. Deconditioning. P:   Continue Celexa & Trazodone PT assessment  Transfer to Tele >> To [facility] 6/17 and PCCM off.   Coralyn Helling, MD [facility] Pulmonary/Critical Care 05/04/2016, 9:15 AM Pager:  [phone] After 3pm call: [phone]

## 2016-05-04 NOTE — Care Management Important Message (Signed)
Important Message  Patient Details  Name: Madison [name] MRN: [id number] Date of Birth: 10-26-1961   Medicare Important Message Given:  Yes    Bernadette Hoit 05/04/2016, 9:18 AM

## 2016-05-04 NOTE — Evaluation (Signed)
Physical Therapy Evaluation Patient Details Name: DIANEY SUCHY MRN: [id number] DOB: Apr 15, 1961 Today's Date: 05/04/2016   History of Present Illness  pt is a 55 y/o female with h/o CAD, HTN, MI, CVA, CHF DM (labile) admitted with DKA found by room mate to be altered mentally with rapid shallow breathing.  Clinical Impression  Pt admitted with/for AMS due to DKA.  Pt currently limited functionally due to the problems listed below.  (see problems list.)  Pt will benefit from PT to maximize function and safety to be able to get home safely with available assist.     Follow Up Recommendations Home health PT    Equipment Recommendations       Recommendations for Other Services       Precautions / Restrictions Precautions Precautions: Fall      Mobility  Bed Mobility Overal bed mobility: Needs Assistance Bed Mobility: Supine to Sit;Sit to Supine     Supine to sit: Supervision Sit to supine: Min assist   General bed mobility comments: assist to help reposition  Transfers Overall transfer level: Needs assistance   Transfers: Sit to/from Stand Sit to Stand: Min guard            Ambulation/Gait Ambulation/Gait assistance: Min assist Ambulation Distance (Feet): 150 Feet Assistive device: 1 person hand held assist (steady assist) Gait Pattern/deviations: Step-through pattern Gait velocity: slower Gait velocity interpretation: Below normal speed for age/gender General Gait Details: stable paretic gait  Stairs            Wheelchair Mobility    Modified Rankin (Stroke Patients Only) Modified Rankin (Stroke Patients Only) Modified Rankin: Moderate disability     Balance Overall balance assessment: Needs assistance Sitting-balance support: No upper extremity supported Sitting balance-Leahy Scale: Good     Standing balance support: No upper extremity supported Standing balance-Leahy Scale: Fair                               Pertinent  Vitals/Pain Pain Assessment: Faces Faces Pain Scale: Hurts little more Pain Location: stomach Pain Descriptors / Indicators: Aching Pain Intervention(s): Monitored during session;Repositioned    Home Living Family/patient expects to be discharged to:: Private residence Living Arrangements: Alone Available Help at Discharge: Personal care attendant;Friend(s) Type of Home: Apartment Home Access: Stairs to enter Entrance Stairs-Rails: Right Entrance Stairs-Number of Steps: 3 Home Layout: One level Home Equipment: Grab bars - tub/shower;Walker - 2 wheels;Cane - single point;Grab bars - toilet;Bedside commode Additional Comments: Pt has roomate who works during the day but has an Aide M - Sat for 3 hrs a day    Prior Function Level of Independence: Independent               Hand Dominance        Extremity/Trunk Assessment   Upper Extremity Assessment: LUE deficits/detail       LUE Deficits / Details: weakness and moves synergistically   Lower Extremity Assessment: LLE deficits/detail   LLE Deficits / Details: mild weakness, moves in isolation     Communication   Communication: No difficulties  Cognition Arousal/Alertness: Awake/alert Behavior During Therapy: WFL for tasks assessed/performed Overall Cognitive Status: Within Functional Limits for tasks assessed                      General Comments General comments (skin integrity, edema, etc.): vitals stable until end of ambulation then EHR jumped to 143 bpm  Exercises        Assessment/Plan    PT Assessment Patient needs continued PT services  PT Diagnosis Abnormality of gait;Generalized weakness   PT Problem List Decreased strength;Decreased activity tolerance;Decreased balance;Decreased mobility  PT Treatment Interventions Gait training;Stair training;Functional mobility training;Therapeutic activities;Balance training;Patient/family education   PT Goals (Current goals can be found in the Care  Plan section) Acute Rehab PT Goals Patient Stated Goal: back home once ready PT Goal Formulation: With patient Time For Goal Achievement: 05/11/16 Potential to Achieve Goals: Good    Frequency Min 3X/week   Barriers to discharge        Co-evaluation               End of Session   Activity Tolerance: Patient tolerated treatment well Patient left: in bed;with call bell/phone within reach Nurse Communication: Mobility status         Time: 2778-2423 PT Time Calculation (min) (ACUTE ONLY): 27 min   Charges:   PT Evaluation $PT Eval Moderate Complexity: 1 Procedure PT Treatments $Gait Training: 8-22 mins   PT G Codes:        Roselynn Whitacre, Eliseo Gum 05/04/2016, 3:45 PM 05/04/2016  Hopewell Bing, PT [phone]  (pager)

## 2016-05-04 NOTE — Progress Notes (Signed)
Hypoglycemic Event  CBG:35  Treatment: 15 GM carbohydrate snack  Symptoms: None  Follow-up CBG: Time:1330 CBG Result:blood glucose per lab draw 58  Possible Reasons for Event: Inadequate meal intake  Comments/MD notified:Pete Babcock notified. Pt eating lunch, given apple juice to drink.    Charlott Holler

## 2016-05-04 NOTE — Progress Notes (Signed)
Hypoglycemic Event  CBG:31 with recheck of 32 Treatment: 15 GM carbohydrate snack  Symptoms: None  Follow-up CBG: Time:1259 CBG Result:37  Possible Reasons for Event: Other: dextrose fluid discontinued this morning, pt eating 50% of meals  Comments/MD notified:Dr. Vassie Loll at bedside    Charlott Holler

## 2016-05-04 NOTE — Progress Notes (Signed)
Hypoglycemic Event  CBG:37 Treatment: 15 GM carbohydrate snack  Symptoms: None  Follow-up CBG: Time:1315 CBG Result:35  Possible Reasons for Event: Inadequate meal intake  And dextrose iv fluid discontinued this morning Comments/MD notified:Pete Tanja Port, NP.  Orders received for stat blood glucose level to verify results.  Patient eating lunch. Is alert, oriented, no symptoms of hypoglycemia noted.    Charlott Holler

## 2016-05-04 NOTE — Progress Notes (Signed)
Inpatient Diabetes Program Recommendations  AACE/ADA: New Consensus Statement on Inpatient Glycemic Control (2015)  Target Ranges:  Prepandial:   less than 140 mg/dL      Peak postprandial:   less than 180 mg/dL (1-2 hours)      Critically ill patients:  140 - 180 mg/dL   Review of Glycemic Control  Diabetes history: DM  Outpatient Diabetes medications: Levemir 30 units daily (just increased 6/12 from 25 units daily)+ Novolog correction scale CBG 70 - 120: 0 units  CBG 121 - 150: 2 unit  CBG 151 - 200: 3 units  CBG 201 - 250: 5 units  CBG 251 - 300: 6units  CBG 301 - 350: 7units  CBG 351 - 400: 10 units  CBG > 400: call MD. + ? Novolog 5 units tid meal coverage Current orders for Inpatient glycemic control: Novolog 4 units tid with meals + Novolog correction 0-15 units tid + 0-5 units hs  Inpatient Diabetes Program Recommendations:  Noted hypoglycemia event.  Please consider restarting basal of Levemir 20 units daily and decrease Novolog correction scale to sensitive 0-9 tid without hs coverage + decrease meal coverage to 2 units and given if eats > 50% of meals.  Thank you, Billy Fischer. Dasani Thurlow, RN, MSN, CDE Inpatient Glycemic Control Team Team Pager [phone] (8am-5pm) 05/04/2016 7:31 AM

## 2016-05-04 NOTE — Progress Notes (Signed)
Results for MARGURITE, DUFFY (MRN [id number]) as of 05/04/2016 13:38  Ref. Range 05/04/2016 08:42 05/04/2016 12:38 05/04/2016 12:40 05/04/2016 12:59 05/04/2016 13:15  Glucose-Capillary Latest Ref Range: 65-99 mg/dL 833 (H) 31 (LL) 32 (LL) 37 (LL) 35 (LL)  Was paged by staff RN regarding patient's low blood sugars.  Patient's CBG was 31 mg/dl, no symptoms of hypoglycemia, patient is eating lunch. Only ate 50 % of breakfast.  May need to recheck blood sugar after eating and then 2 hours later to make sure blood sugar is above 70 mg/dl. Recommend decreasing Lantus to 15 Units daily if patient continues to have blood sugars less than 100 mg/dl.  Smith Mince RN BSN CDE

## 2016-05-04 NOTE — Progress Notes (Signed)
Admission note:  Arrival Method: Patient arrived in w/c accompanied by the staff from [facility]. Mental Orientation: Alert and oriented x 4. Telemetry: NSR, 6E-28 box, CCMD notified. Assessment: See doc flow sheets. Skin: Warm, dry and intact, no open wounds or any bruising noted.  Assesed by two nurses ([place]) IV: Right EJ, D5 1/2 NS 75 ml/hr Pain: Denies any pain currently. Tubes: N/A Safety Measures: Bed alarm, bed in low position, call bell and phone within reach. Fall Prevention Safety Plan: Reviewed the plan, understood and acknowledged by the patient. Admission Screening: In  progress 6700 Orientation: Patient has been oriented to the unit, staff and to the room.

## 2016-05-04 NOTE — Progress Notes (Signed)
Blood glucoses in 30s. Got her long acting insulin but intake is poor.  She is asymptomatic   Plan Will dc long acting for now Place her on d51/2 Cont to watch CBGs Encourage PO intake   Simonne Martinet ACNP-BC [facility] Pulmonary/Critical Care Pager # [phone] OR # [phone] if no answer

## 2016-05-04 NOTE — Progress Notes (Signed)
ANTICOAGULATION CONSULT NOTE  Pharmacy Consult for warfarin + Lovenox Indication: AVR  Allergies  Allergen Reactions  . Adhesive [Tape] Other (See Comments)    Can burn skin if left on too long  . Celebrex [Celecoxib] Rash  . Detrol [Tolterodine] Hives    Patient Measurements: Height: 5\' 3"  (160 cm) Weight: 133 lb 2.5 oz (60.4 kg) IBW/kg (Calculated) : 52.4  Vital Signs: Temp: 98.9 F (37.2 C) (06/16 1235) Temp Source: Oral (06/16 1235) BP: 92/42 mmHg (06/16 1235) Pulse Rate: 78 (06/16 1235)  Labs:  Recent Labs  05/01/16 2000  05/02/16 0100 05/02/16 0530  05/02/16 1606 05/03/16 0315 05/04/16 0332 05/04/16 0928  HGB  --   --   --  8.7*  --   --  8.4* 8.6*  --   HCT  --   --   --  26.6*  --   --  26.2* 27.3*  --   PLT  --   --   --  267  --   --  220 177  --   LABPROT  --   --   --  50.6*  --   --  17.4*  --  14.6  INR  --   --   --  5.85*  --   --  1.42  --  1.12  CREATININE  --   < > 1.53* 0.99  < > 0.79 0.69 0.68  --   TROPONINI <0.03  --  <0.03  --   --   --   --   --   --   < > = values in this interval not displayed.  Estimated Creatinine Clearance: 65.7 mL/min (by C-G formula based on Cr of 0.68).   Medical History: Past Medical History  Diagnosis Date  . CAD (coronary artery disease)   . Obesity   . Hypercholesteremia   . HTN (hypertension)   . Dyslipidemia   . Aortic stenosis   . Thrombophlebitis   . Heart murmur   . Myocardial infarction (HCC) 12/2014  . Pneumonia 11/2014; 12/2014  . GERD (gastroesophageal reflux disease)   . Stroke syndrome [facility]) 1995; 2001    "when my son was born; problems w/speech and L hand since then" (01/19/2015)  . Anxiety   . Depression   . Allergy   . CHF (congestive heart failure) (HCC)   . Rheumatoid arthritis(714.0)     "hands" (07/01/2015)  . Diabetes (HCC) dx'd 1982    "went straight to insulin"    Medications:  See EMR  Assessment: On long term anticoagulation for hx of AVR. Pt was admitted with INR of  5.4. She rec'd Vitamin K 10 mg x1, INR now down to 1.1. Will bridge with Lovenox until INR is therapeutic. On warfarin PTA: 7.5 mg po daily  -- but admitted with INR 5.4. Hgb 8.6, plts wnl.   Goal of Therapy:  INR 2.5-3.5  Monitor platelets by anticoagulation protocol: Yes    Plan:  -Warfarin 7.5 mg po x1 -Lovenox 60 mg [place] q12h -CBC q72h -Daily INR -Discontinue Lovenox when INR therapeutic     1983 05/04/2016,12:51 PM

## 2016-05-04 NOTE — Progress Notes (Signed)
Hypoglycemic Event  CBG:57 Treatment: 15 GM carbohydrate snack  Symptoms: None  Follow-up CBG: Time:1431 CBG Result:78  Possible Reasons for Event: Inadequate meal intake  Comments/MD notified:D5 1/2 normal saline hung as per orders   Charlott Holler

## 2016-05-05 DIAGNOSIS — Z794 Long term (current) use of insulin: Secondary | ICD-10-CM

## 2016-05-05 DIAGNOSIS — E1010 Type 1 diabetes mellitus with ketoacidosis without coma: Secondary | ICD-10-CM

## 2016-05-05 LAB — BASIC METABOLIC PANEL
ANION GAP: 5 (ref 5–15)
BUN: <5 mg/dL — ABNORMAL LOW (ref 6–20)
CALCIUM: 8.3 mg/dL — AB (ref 8.9–10.3)
CHLORIDE: 111 mmol/L (ref 101–111)
CO2: 25 mmol/L (ref 22–32)
CREATININE: 0.69 mg/dL (ref 0.44–1.00)
GFR calc non Af Amer: >60 mL/min (ref >60)
Glucose, Bld: 85 mg/dL (ref 65–99)
Potassium: 3.9 mmol/L (ref 3.5–5.1)
SODIUM: 141 mmol/L (ref 135–145)

## 2016-05-05 LAB — PROTIME-INR
INR: 1.29 (ref 0.00–1.49)
Prothrombin Time: 16.2 seconds — ABNORMAL HIGH (ref 11.6–15.2)

## 2016-05-05 LAB — GLUCOSE, CAPILLARY
GLUCOSE-CAPILLARY: 91 mg/dL (ref 65–99)
GLUCOSE-CAPILLARY: 42 mg/dL — AB (ref 65–99)
GLUCOSE-CAPILLARY: 246 mg/dL — AB (ref 65–99)
GLUCOSE-CAPILLARY: 226 mg/dL — AB (ref 65–99)
Glucose-Capillary: 29 mg/dL — CL (ref 65–99)
Glucose-Capillary: 187 mg/dL — ABNORMAL HIGH (ref 65–99)

## 2016-05-05 MED ORDER — WARFARIN SODIUM 7.5 MG PO TABS: 7.5000 mg | ORAL_TABLET | Freq: Once | ORAL | Status: DC

## 2016-05-05 MED ORDER — WARFARIN SODIUM 10 MG PO TABS
10.0000 mg | ORAL_TABLET | Freq: Once | ORAL | Status: AC
  Administered 2016-05-05: 10 mg via ORAL
  Filled 2016-05-05: qty 1

## 2016-05-05 MED ORDER — DEXTROSE 50 % IV SOLN
INTRAVENOUS | Status: AC
  Administered 2016-05-05: 50 mL
  Filled 2016-05-05: qty 50

## 2016-05-05 MED ORDER — DEXTROSE 50 % IV SOLN
INTRAVENOUS | Status: AC
  Administered 2016-05-05: 25 mL
  Filled 2016-05-05: qty 50

## 2016-05-05 NOTE — Progress Notes (Signed)
Hypoglycemic Event  CBG: 29  Treatment: D50 IV 25 mL  Symptoms: Sweaty  Follow-up CBG: Time:0258 CBG Result:91  Possible Reasons for Event: Inadequate meal intake    Zakir Henner, Lyman Speller

## 2016-05-05 NOTE — Progress Notes (Addendum)
PROGRESS NOTE                                                                                                                                                                                                             Patient Demographics:    Madison [name], is a 55 y.o. female, DOB - 1961/06/25, ELT:[id number]  Admit date - 04/30/2016   Admitting Physician Roslynn Amble, MD  Outpatient Primary MD for the patient is No primary care provider on file.  LOS - 5  Outpatient Specialists: none  Chief Complaint  Patient presents with  . 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 She was found unresponsive by her roommate and was intubated in the ED. Patient was hypertensive post intubation admitted to ICU on pressors.  Patient extubated on 6/14 and transferred to hospitalist service on 6/16.   Subjective:   Patient denies any specific symptoms. Was hypoglycemic yesterday afternoon. No overnight events.   Assessment  & Plan :   Principal problem Acute hypoxemic respiratory failure (HCC) Possibly due to hypovolemic/septic shock with DKA. Required mechanical ventilation in the ICU. No source of infection. Cultures negative. Discontinue antibiotics.    DKA (diabetic ketoacidoses) (HCC) Recurrent episodes. Managed in the ICU. Had multiple hypoglycemic episodes yesterday. Lantus dose held and monitored on sensitive sliding coverage. CBG in 200s today. Patient on D5. A1c of 9.9 in March. Will repeat. Diabetic  coordinator following. Needs outpatient referral to endocrinology   Brittle diabetes with hypoglycemia on 6/16 Lantus held and placed on D5NS. CBG in 200s.  Septic shock Possibly triggered by DKA. Source of infection. Will discontinue Zosyn.    Hypokalemia/hypomagnesemia/hypophosphatemia Replenish  GERD Continue PPI  AVR on Coumadin Dosing per pharmacy. INR subtherapeutic, bridging with lovenox  CAD with history of CABG. Resume statin.  Anxiety and Depression Hold celexa given prolonged Qtc. Repeat EKG in am       Code Status : Full code  Family Communication  : None at bedside  Disposition Plan  :  Home with Home health PT in 24-48 hrs if CBG stable.  Barriers For Discharge : Hypoglycemia  Consults  :  PC CM  Procedures  :  Intubation Head CT  DVT Prophylaxis  : Coumadin  Lab Results  Component Value Date   PLT 177 05/04/2016    Antibiotics  :    Anti-infectives    Start     Dose/Rate Route Frequency Ordered Stop   05/02/16 0600  vancomycin (VANCOCIN) IVPB 1000 mg/200 mL premix  Status:  Discontinued     1,000 mg 200 mL/hr over 60 Minutes Intravenous Every 24 hours 05/01/16 0033 05/03/16 1157   05/01/16 1000  piperacillin-tazobactam (ZOSYN) IVPB 3.375 g     3.375 g 12.5 mL/hr over 240 Minutes Intravenous Every 8 hours 05/01/16 0033     04/30/16 2330  piperacillin-tazobactam (ZOSYN) IVPB 3.375 g     3.375 g 100 mL/hr over 30 Minutes Intravenous  Once 04/30/16 2325 05/01/16 0116   04/30/16 2330  vancomycin (VANCOCIN) IVPB 1000 mg/200 mL premix     1,000 mg 200 mL/hr over 60 Minutes Intravenous  Once 04/30/16 2325 05/01/16 0157        Objective:   Filed Vitals:   05/04/16 1816 05/04/16 2015 05/05/16 0413 05/05/16 0900  BP: 100/60 93/59 100/52 105/56  Pulse: 76 74 63 68  Temp: 98.8 F (37.1 C) 98.3 F (36.8 C) 97.6 F (36.4 C) 98.6 F (37 C)  TempSrc: Oral Oral Oral Oral  Resp: 19  18 20   Height:      Weight:  63.141 kg (139 lb 3.2 oz)     SpO2: 100% 100% 100% 100%    Wt Readings from Last 3 Encounters:  05/04/16 63.141 kg (139 lb 3.2 oz)  04/30/16 54.432 kg (120 lb)  04/24/16 54.386 kg (119 lb 14.4 oz)     Intake/Output Summary (Last 24 hours) at 05/05/16 1503 Last data filed at 05/05/16 0900  Gross per 24 hour  Intake   2410 ml  Output      0 ml  Net   2410 ml     Physical Exam  Gen: not in distress, thin built, fatigue HEENT: no pallor, moist mucosa, supple neck Chest: clear b/l, no added sounds CVS: N S1&S2, no murmurs, rubs or gallop GI: soft, NT, ND, BS+ Musculoskeletal: warm, no edema CNS: AAOX3, chronic left-sided weakness,    Data Review:    CBC  Recent Labs Lab 04/30/16 2250  05/01/16 0500 05/01/16 1715 05/02/16 0530 05/03/16 0315 05/04/16 0332  WBC 24.3*  --  27.6* 15.5* 13.1* 9.4 4.7  HGB 9.0*  < > 9.3* 8.7* 8.7* 8.4* 8.6*  HCT 31.4*  < > 30.1* 26.7* 26.6* 26.2* 27.3*  PLT 425*  --  396 288 267 220 177  MCV 95.2  --  88.3 82.9 81.3 84.2 86.9  MCH 27.3  --  27.3 27.0 26.6 27.0 27.4  MCHC 28.7*  --  30.9 32.6 32.7 32.1 31.5  RDW 17.1*  --  16.5* 16.0* 16.3* 16.7* 16.7*  LYMPHSABS 4.9*  --  2.2 1.8 2.5  --   --   MONOABS 1.2*  --  1.1* 1.2* 0.9  --   --   EOSABS 0.0  --  0.0 0.0 0.0  --   --   BASOSABS 0.0  --  0.0 0.0 0.0  --   --   < > = values in this interval not displayed.  Chemistries   Recent Labs Lab 05/01/16 0155  05/01/16 1715  05/02/16 0100  05/02/16 0800 05/02/16 1606 05/03/16 0315 05/04/16 0332 05/04/16 1330 05/05/16 0450  NA 133*  < > 134*  < > 137  < > 134* 136 139 143  --  141  K 5.3*  < > 4.0  < > 2.7*  < > 3.0* 3.6 2.9* 3.7  --  3.9  CL 103  < > 100*  < > 103  < > 99* 101 105 113*  --  111  CO2 7*  < > 17*  < > 15*  < > 23 28 28 26   --  25  GLUCOSE 832*  < > 364*  < > 257*  < > 238* 118* 104* 32* 58* 85  BUN 16  < > 10  < > 8  < > 6 <5* <5* <5*  --  <5*  CREATININE 1.80*  < > 1.12*  < > 1.53*  < > 1.00 0.79 0.69 0.68  --  0.69  CALCIUM 7.9*  < >  7.5*  < > 7.7*  < > 7.3* 7.4* 7.6* 8.3*  --  8.3*  MG 2.1  < > 2.0  --  2.0  --  1.8 1.9  --  2.0  --   --   AST 39  --   --   --   --   --   --   --   --   --   --   --   ALT 20  --   --   --   --   --   --   --   --   --   --   --   ALKPHOS 123  --   --   --   --   --   --   --   --   --   --   --   BILITOT 1.5*  --   --   --   --   --   --   --   --   --   --   --   < > = values in this interval not displayed. ------------------------------------------------------------------------------------------------------------------ No results for input(s): CHOL, HDL, LDLCALC, TRIG, CHOLHDL, LDLDIRECT in the last 72 hours.  Lab Results  Component Value Date   HGBA1C 9.9* 01/28/2016   ------------------------------------------------------------------------------------------------------------------ No results for input(s): TSH, T4TOTAL, T3FREE, THYROIDAB in the last 72 hours.  Invalid input(s): FREET3 ------------------------------------------------------------------------------------------------------------------ No results for input(s): VITAMINB12, FOLATE, FERRITIN, TIBC, IRON, RETICCTPCT in the last 72 hours.  Coagulation profile  Recent Labs Lab 05/01/16 0500 05/02/16 0530 05/03/16 0315 05/04/16 0928 05/05/16 0450  INR 4.69* 5.85* 1.42 1.12 1.29    No results for input(s): DDIMER in the last 72 hours.  Cardiac Enzymes  Recent Labs Lab 05/01/16 1239 05/01/16 2000 05/02/16 0100  TROPONINI <0.03 <0.03 <0.03   ------------------------------------------------------------------------------------------------------------------    Component Value Date/Time   BNP 73.7 03/27/2016 0832    Inpatient Medications  Scheduled Meds: . citalopram  40 mg Oral Daily  . enoxaparin (LOVENOX) injection  60 mg Subcutaneous BID  . insulin aspart  0-5 Units Subcutaneous QHS  . insulin aspart  0-9 Units Subcutaneous TID WC  . pantoprazole  40 mg Oral Q1200  . piperacillin-tazobactam (ZOSYN)   IV  3.375 g Intravenous Q8H  . traZODone  50 mg Oral QHS  . warfarin  10 mg Oral ONCE-1800  . Warfarin - Pharmacist Dosing Inpatient   Does not apply [phone]  Continuous Infusions: . dextrose 5 % and 0.45% NaCl 75 mL/hr at 05/05/16 0646   PRN Meds:.sodium chloride, acetaminophen, fentaNYL (SUBLIMAZE) injection  Micro Results Recent Results (from the past 240 hour(s))  Urine culture     Status: None   Collection Time: 04/30/16 11:39 PM  Result Value Ref Range Status   Specimen Description URINE, CATHETERIZED  Final   Special Requests NONE  Final   Culture NO GROWTH  Final   Report Status 05/02/2016 FINAL  Final  Blood culture (routine x 2)     Status: None (Preliminary result)   Collection Time: 05/01/16 12:25 AM  Result Value Ref Range Status   Specimen Description BLOOD RIGHT FEMORAL CVC  Final   Special Requests BOTTLES DRAWN AEROBIC AND ANAEROBIC  Final   Culture NO GROWTH 3 DAYS  Final   Report Status PENDING  Incomplete  Blood culture (routine x 2)     Status: None (Preliminary result)   Collection Time: 05/01/16 12:40 AM  Result Value Ref Range Status   Specimen Description BLOOD RIGHT FEMORAL CVC  Final   Special Requests IN PEDIATRIC BOTTLE  Final   Culture NO GROWTH 3 DAYS  Final   Report Status PENDING  Incomplete  MRSA PCR Screening     Status: None   Collection Time: 05/01/16  1:23 AM  Result Value Ref Range Status   MRSA by PCR NEGATIVE NEGATIVE Final    Comment:        The GeneXpert MRSA Assay (FDA approved for NASAL specimens only), is one component of a comprehensive MRSA colonization surveillance program. It is not intended to diagnose MRSA infection nor to guide or monitor treatment for MRSA infections.   Culture, respiratory (NON-Expectorated)     Status: None   Collection Time: 05/01/16  1:00 PM  Result Value Ref Range Status   Specimen Description TRACHEAL ASPIRATE  Final   Special Requests Normal  Final   Gram Stain   Final    FEW WBC  PRESENT,BOTH PMN AND MONONUCLEAR NO ORGANISMS SEEN    Culture NO GROWTH 2 DAYS  Final   Report Status 05/03/2016 FINAL  Final  Culture, blood (Routine X 2) w Reflex to ID Panel     Status: None (Preliminary result)   Collection Time: 05/01/16  6:55 PM  Result Value Ref Range Status   Specimen Description BLOOD RIGHT HAND  Final   Special Requests IN PEDIATRIC BOTTLE 1.5CC  Final   Culture NO GROWTH 3 DAYS  Final   Report Status PENDING  Incomplete  Culture, blood (Routine X 2) w Reflex to ID Panel     Status: None (Preliminary result)   Collection Time: 05/01/16  7:23 PM  Result Value Ref Range Status   Specimen Description BLOOD LEFT ANTECUBITAL  Final   Special Requests IN PEDIATRIC BOTTLE 3CC  Final   Culture NO GROWTH 3 DAYS  Final   Report Status PENDING  Incomplete  Culture, Urine     Status: None   Collection Time: 05/01/16 11:24 PM  Result Value Ref Range Status   Specimen Description URINE, CATHETERIZED  Final   Special Requests NONE  Final   Culture NO GROWTH  Final   Report Status 05/03/2016 FINAL  Final  Culture, respiratory (NON-Expectorated)     Status: None   Collection Time: 05/01/16 11:30 PM  Result Value Ref Range Status   Specimen Description TRACHEAL ASPIRATE  Final   Special Requests Normal  Final   Gram  Stain   Final    FEW WBC PRESENT,BOTH PMN AND MONONUCLEAR NO ORGANISMS SEEN    Culture Consistent with normal respiratory flora.  Final   Report Status 05/04/2016 FINAL  Final    Radiology Reports Ct Head Wo Contrast  04/30/2016  CLINICAL DATA:  Acute onset of shortness of breath. Initial encounter. EXAM: CT HEAD WITHOUT CONTRAST TECHNIQUE: Contiguous axial images were obtained from the base of the skull through the vertex without intravenous contrast. COMPARISON:  CT of the head performed 02/26/2016 FINDINGS: There is no evidence of acute infarction, mass lesion, or intra- or extra-axial hemorrhage on CT. Prominence of the ventricles and sulci reflects  mild to moderate cortical volume loss. 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 Diffuse periventricular and subcortical white matter change likely reflects small vessel ischemic microangiopathy. Small chronic infarcts are noted at the cerebellar hemispheres bilaterally, more prominent on the left. The brainstem and fourth ventricle are within normal limits. The basal ganglia are unremarkable in appearance. No mass effect or midline shift is seen. There is no evidence of fracture; visualized osseous structures are unremarkable in appearance. The orbits are within normal limits. The paranasal sinuses and mastoid air cells are well-aerated. No significant soft tissue abnormalities are seen. IMPRESSION: 1. No acute intracranial pathology seen on CT. 2. Mild to moderate cortical volume loss and diffuse small vessel ischemic microangiopathy. 3. 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 4. Small chronic infarcts at the cerebellar hemispheres bilaterally, more prominent on the left. Electronically Signed   By: Roanna Raider M.D.   On: 04/30/2016 23:43   Dg Chest Port 1 View  05/02/2016  CLINICAL DATA:  Hypoxia EXAM: PORTABLE CHEST 1 VIEW COMPARISON:  April 30, 2016 FINDINGS: Endotracheal tube tip is in the right main bronchus. Nasogastric tube tip and side port are in the stomach. No pneumothorax. There is no edema or consolidation. Heart size and pulmonary vascular normal. No adenopathy. IMPRESSION: Endotracheal tube tip in proximal right main bronchus. Advise withdrawing endotracheal tube approximately 4 to 4.5 cm. No pneumothorax. No edema or consolidation. Critical Value/emergent results were called by telephone at the time of interpretation on 05/02/2016 at 7:01 am to Larey Seat,  RN, who verbally acknowledged these results. Electronically Signed   By: Bretta Bang III M.D.   On: 05/02/2016 07:02   Dg Chest Portable 1 View  04/30/2016  CLINICAL DATA:  Unresponsive patient. Endotracheal tube and enteric tubes were placed. Shortness of breath. EXAM: PORTABLE CHEST 1 VIEW COMPARISON:  03/26/2016 FINDINGS: Postoperative changes in the mediastinum. An endotracheal tube has been placed with tip measuring 3 cm above the carina. An enteric tube has been placed with tip in the left upper quadrant consistent with location in the upper stomach. Normal heart size and pulmonary vascularity. No focal airspace disease or consolidation in the lungs. No blunting of costophrenic angles. No pneumothorax. Mediastinal contours appear intact. IMPRESSION: Appliances appear to be in satisfactory position. No evidence of active pulmonary disease. Electronically Signed   By: Burman Nieves M.D.   On: 04/30/2016 23:15    Time Spent in minutes  25   Eddie North M.D on 05/05/2016 at 3:03 PM  Between 7am to 7pm - Pager - [phone]  After 7pm go to [email or web address] - password [email or web address]  [facility] -  Office  [phone]      

## 2016-05-05 NOTE — Progress Notes (Signed)
Hypoglycemic Event  CBG: 42  Treatment D50% 50 ml  Symptoms:none  Follow-up CBG: Time: 0858 CBG Result:187  Possible Reasons for Event:Poor IV,poor food intake  Comments/MD notified: yes    [place], Kem Kays

## 2016-05-05 NOTE — Progress Notes (Addendum)
ANTICOAGULATION CONSULT NOTE  Pharmacy Consult for warfarin/enoxaparin Indication: mAVR  Allergies  Allergen Reactions  . Adhesive [Tape] Other (See Comments)    Can burn skin if left on too long  . Celebrex [Celecoxib] Rash  . Detrol [Tolterodine] Hives    Patient Measurements: Height: 5\' 3"  (160 cm) Weight: 139 lb 3.2 oz (63.141 kg) IBW/kg (Calculated) : 52.4  Vital Signs: Temp: 98.6 F (37 C) (06/17 0900) Temp Source: Oral (06/17 0900) BP: 105/56 mmHg (06/17 0900) Pulse Rate: 68 (06/17 0900)  Labs:  Recent Labs  05/03/16 0315 05/04/16 0332 05/04/16 0928 05/05/16 0450  HGB 8.4* 8.6*  --   --   HCT 26.2* 27.3*  --   --   PLT 220 177  --   --   LABPROT 17.4*  --  14.6 16.2*  INR 1.42  --  1.12 1.29  CREATININE 0.69 0.68  --  0.69    Estimated Creatinine Clearance: 71.1 mL/min (by C-G formula based on Cr of 0.69).   Medical History: Past Medical History  Diagnosis Date  . CAD (coronary artery disease)   . Obesity   . Hypercholesteremia   . HTN (hypertension)   . Dyslipidemia   . Aortic stenosis   . Thrombophlebitis   . Heart murmur   . Myocardial infarction (HCC) 12/2014  . Pneumonia 11/2014; 12/2014  . GERD (gastroesophageal reflux disease)   . 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 Anxiety   . Depression   . Allergy   . CHF (congestive heart failure) (HCC)   . Rheumatoid arthritis(714.0)     "hands" (07/01/2015)  . Diabetes (HCC) dx'd 1982    "went straight to insulin"    Medications:  See EMR  Assessment: On long term anticoagulation for hx of mAVR. Pt was admitted with INR of 5.4. She rec'd Vitamin K 10 mg x1 6/14, INR decreased to 1.42. INR today remains subtherapeutic after 2 days 7.5 mg. Bridging with enoxaparin until INR > 2.5. On warfarin PTA: 7.5 mg po daily  Goal of Therapy:  INR 2.5-3.5  Monitor platelets by anticoagulation protocol: Yes   Plan:   -warfarin 10 mg po x1 -daily INR - Continue enoxaparin 60 mg BID - Monitor for s/sx bleeding  7/14, PharmD Clinical Pharmacy Resident Pager # [phone] 05/05/2016 10:48 AM

## 2016-05-06 DIAGNOSIS — E119 Type 2 diabetes mellitus without complications: Secondary | ICD-10-CM | POA: Diagnosis not present

## 2016-05-06 LAB — CULTURE, BLOOD (ROUTINE X 2)
CULTURE: NO GROWTH
Culture: NO GROWTH
Culture: NO GROWTH
Culture: NO GROWTH

## 2016-05-06 LAB — GLUCOSE, CAPILLARY
GLUCOSE-CAPILLARY: 129 mg/dL — AB (ref 65–99)
GLUCOSE-CAPILLARY: 302 mg/dL — AB (ref 65–99)
GLUCOSE-CAPILLARY: 289 mg/dL — AB (ref 65–99)
Glucose-Capillary: 276 mg/dL — ABNORMAL HIGH (ref 65–99)
Glucose-Capillary: 198 mg/dL — ABNORMAL HIGH (ref 65–99)

## 2016-05-06 LAB — PROTIME-INR
INR: 1.3 (ref 0.00–1.49)
Prothrombin Time: 16.3 seconds — ABNORMAL HIGH (ref 11.6–15.2)

## 2016-05-06 MED ORDER — WARFARIN SODIUM 10 MG PO TABS
10.0000 mg | ORAL_TABLET | Freq: Once | ORAL | Status: AC
  Administered 2016-05-06: 10 mg via ORAL
  Filled 2016-05-06: qty 1

## 2016-05-06 NOTE — Progress Notes (Signed)
ANTICOAGULATION CONSULT NOTE  Pharmacy Consult for warfarin/enoxaparin Indication: mAVR  Allergies  Allergen Reactions  . Adhesive [Tape] Other (See Comments)    Can burn skin if left on too long  . Celebrex [Celecoxib] Rash  . Detrol [Tolterodine] Hives    Patient Measurements: Height: 5\' 3"  (160 cm) Weight: 141 lb 6.4 oz (64.139 kg) IBW/kg (Calculated) : 52.4  Vital Signs: Temp: 97.7 F (36.5 C) (06/18 0500) Temp Source: Oral (06/18 0500) BP: 126/51 mmHg (06/18 0500) Pulse Rate: 66 (06/18 0500)  Labs:  Recent Labs  05/04/16 0332 05/04/16 0928 05/05/16 0450 05/06/16 0255  HGB 8.6*  --   --   --   HCT 27.3*  --   --   --   PLT 177  --   --   --   LABPROT  --  14.6 16.2* 16.3*  INR  --  1.12 1.29 1.30  CREATININE 0.68  --  0.69  --     Estimated Creatinine Clearance: 71.6 mL/min (by C-G formula based on Cr of 0.69).   Medical History: Past Medical History  Diagnosis Date  . CAD (coronary artery disease)   . Obesity   . Hypercholesteremia   . HTN (hypertension)   . Dyslipidemia   . Aortic stenosis   . Thrombophlebitis   . Heart murmur   . Myocardial infarction (HCC) 12/2014  . Pneumonia 11/2014; 12/2014  . GERD (gastroesophageal reflux disease)   . Stroke syndrome [facility]) 1995; 2001    "when my son was born; problems w/speech and L hand since then" (01/19/2015)  . Anxiety   . Depression   . Allergy   . CHF (congestive heart failure) (HCC)   . Rheumatoid arthritis(714.0)     "hands" (07/01/2015)  . Diabetes (HCC) dx'd 1982    "went straight to insulin"    Medications:  See EMR  Assessment: On long term anticoagulation for hx of mAVR. Pt was admitted with INR of 5.4. She rec'd Vitamin K 10 mg x1 6/14, INR decreased to 1.42. INR today remains subtherapeutic. Bridging with enoxaparin until INR > 2.5. On warfarin PTA: 7.5 mg po daily  Goal of Therapy:  INR 2.5-3.5  Monitor platelets by anticoagulation protocol: Yes   Plan:  -warfarin 10 mg po  x1 -daily INR - Continue enoxaparin 60 mg BID - Monitor for s/sx bleeding  7/14, PharmD Clinical Pharmacy Resident Pager # [phone] 05/06/2016 8:13 AM

## 2016-05-06 NOTE — Progress Notes (Signed)
PROGRESS NOTE                                                                                                                                                                                                             Patient Demographics:    Madison [name], is a 55 y.o. female, DOB - 02/03/1961, XTK:[id number]  Admit date - 04/30/2016   Admitting Physician Roslynn Amble, MD  Outpatient Primary MD for the patient is No primary care provider on file.  LOS - 6  Outpatient Specialists: none  Chief Complaint  Patient presents with  . 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 She was found unresponsive by her roommate and was intubated in the ED. Patient was hypertensive post intubation admitted to ICU on pressors.  Patient extubated on 6/14 and transferred to hospitalist service on 6/16.   Subjective:   cBG in 200. Denies any symptoms.   Assessment  & Plan :   Principal problem Acute hypoxemic respiratory failure (HCC) Possibly due to hypovolemic/septic shock with DKA. Required mechanical ventilation in the ICU. No source of infection. Cultures negative. Discontinued antibiotics.    DKA (diabetic ketoacidoses) (HCC) Recurrent episodes. Managed in the ICU. Multiple hypoglycemic episodes on 6/16 and placed on D5. Lantus held. CBGs in 200s.  discontinue D5 and monitor on sensitive sliding scale coverage. . Lantus dose held and monitored on sensitive sliding coverage. CBG in 200s today. Patient on  D5. A1c of 9.9 in March. Check repeat in a.m. Diabetic coordinator following. Patient reports that her PCP has made referral to outpatient endocrinology. Related to very fine and and if not she definitely needs endocrinology referral.   Brittle diabetes with hypoglycemia on 6/16 Lantus held and placed on D5NS. CBG in 200s.  Septic shock Possibly triggered by DKA. No source of infection. Will discontinue Zosyn.    Hypokalemia/hypomagnesemia/hypophosphatemia Replenished  GERD Continue PPI  AVR on Coumadin Dosing per pharmacy. INR subtherapeutic, bridging with lovenox  CAD with history of CABG. Continue  statin.  Anxiety and Depression Held celexa given prolonged Qtc. Improved on repeat EKG.       Code Status : Full code  Family Communication  : None at bedside  Disposition Plan  : Home with Home health PT in a.m. if CBG stable.  Barriers For Discharge : Hypoglycemia  Consults  :  PC CM  Procedures  :  Intubation Head CT  DVT Prophylaxis  : Coumadin  Lab Results  Component Value Date   PLT 177 05/04/2016    Antibiotics  :    Anti-infectives    Start     Dose/Rate Route Frequency Ordered Stop   05/02/16 0600  vancomycin (VANCOCIN) IVPB 1000 mg/200 mL premix  Status:  Discontinued     1,000 mg 200 mL/hr over 60 Minutes Intravenous Every 24 hours 05/01/16 0033 05/03/16 1157   05/01/16 1000  piperacillin-tazobactam (ZOSYN) IVPB 3.375 g  Status:  Discontinued     3.375 g 12.5 mL/hr over 240 Minutes Intravenous Every 8 hours 05/01/16 0033 05/05/16 1531   04/30/16 2330  piperacillin-tazobactam (ZOSYN) IVPB 3.375 g     3.375 g 100 mL/hr over 30 Minutes Intravenous  Once 04/30/16 2325 05/01/16 0116   04/30/16 2330  vancomycin (VANCOCIN) IVPB 1000 mg/200 mL premix     1,000 mg 200 mL/hr over 60 Minutes Intravenous  Once 04/30/16 2325 05/01/16 0157        Objective:   Filed Vitals:   05/05/16 1729 05/05/16 2112 05/06/16 0500 05/06/16 1007  BP: 97/61 96/46  126/51 128/50  Pulse: 80 78 66 69  Temp: 98 F (36.7 C) 98.7 F (37.1 C) 97.7 F (36.5 C) 98.2 F (36.8 C)  TempSrc: Oral Oral Oral Oral  Resp: Height:      Weight:  64.139 kg (141 lb 6.4 oz)    SpO2: 100% 100% 99% 98%    Wt Readings from Last 3 Encounters:  05/05/16 64.139 kg (141 lb 6.4 oz)  04/30/16 54.432 kg (120 lb)  04/24/16 54.386 kg (119 lb 14.4 oz)     Intake/Output Summary (Last 24 hours) at 05/06/16 1607 Last data filed at 05/06/16 0900  Gross per 24 hour  Intake 2575.42 ml  Output      0 ml  Net 2575.42 ml     Physical Exam  Gen: not in distress,  HEENT:  moist mucosa, supple neck Chest: clear b/l, no added sounds CVS: N S1&S2, no murmurs GI: soft, NT, ND Musculoskeletal: warm, no edema CNS: Alert and oriented    Data Review:    CBC  Recent Labs Lab 04/30/16 2250  05/01/16 0500 05/01/16 1715 05/02/16 0530 05/03/16 0315 05/04/16 0332  WBC 24.3*  --  27.6* 15.5* 13.1* 9.4 4.7  HGB 9.0*  < > 9.3* 8.7* 8.7* 8.4* 8.6*  HCT 31.4*  < > 30.1* 26.7* 26.6* 26.2* 27.3*  PLT 425*  --  396 288 267 220 177  MCV 95.2  --  88.3 82.9 81.3 84.2 86.9  MCH 27.3  --  27.3 27.0 26.6 27.0 27.4  MCHC 28.7*  --  30.9 32.6 32.7 32.1 31.5  RDW 17.1*  --  16.5* 16.0* 16.3* 16.7* 16.7*  LYMPHSABS 4.9*  --  2.2 1.8 2.5  --   --   MONOABS 1.2*  --  1.1* 1.2* 0.9  --   --   EOSABS 0.0  --  0.0 0.0 0.0  --   --   BASOSABS 0.0  --  0.0 0.0 0.0  --   --   < > = values in this interval not displayed.  Chemistries   Recent Labs Lab 05/01/16 0155  05/01/16 1715  05/02/16 0100  05/02/16 0800 05/02/16 1606 05/03/16 0315 05/04/16 0332 05/04/16 1330 05/05/16 0450  NA 133*  < > 134*  < > 137  < > 134* 136 139 143  --  141  K 5.3*  < > 4.0  < > 2.7*  < > 3.0* 3.6 2.9* 3.7  --  3.9  CL 103  < > 100*  < > 103  < > 99* 101 105 113*  --  111  CO2 7*  < > 17*  < > 15*  < > 23 28 28 26   --  25  GLUCOSE 832*  < > 364*  < > 257*  < > 238* 118* 104* 32* 58* 85   BUN 16  < > 10  < > 8  < > 6 <5* <5* <5*  --  <5*  CREATININE 1.80*  < > 1.12*  < > 1.53*  < > 1.00 0.79 0.69 0.68  --  0.69  CALCIUM 7.9*  < > 7.5*  < > 7.7*  < > 7.3* 7.4* 7.6* 8.3*  --  8.3*  MG 2.1  < > 2.0  --  2.0  --  1.8 1.9  --  2.0  --   --   AST 39  --   --   --   --   --   --   --   --   --   --   --   ALT 20  --   --   --   --   --   --   --   --   --   --   --   ALKPHOS 123  --   --   --   --   --   --   --   --   --   --   --   BILITOT 1.5*  --   --   --   --   --   --   --   --   --   --   --   < > = values in this interval not displayed. ------------------------------------------------------------------------------------------------------------------ No results for input(s): CHOL, HDL, LDLCALC, TRIG, CHOLHDL, LDLDIRECT in the last 72 hours.  Lab Results  Component Value Date   HGBA1C 9.9* 01/28/2016   ------------------------------------------------------------------------------------------------------------------ No results for input(s): TSH, T4TOTAL, T3FREE, THYROIDAB in the last 72 hours.  Invalid input(s): FREET3 ------------------------------------------------------------------------------------------------------------------ No results for input(s): VITAMINB12, FOLATE, FERRITIN, TIBC, IRON, RETICCTPCT in the last 72 hours.  Coagulation profile  Recent Labs Lab 05/02/16 0530 05/03/16 0315 05/04/16 0928 05/05/16 0450 05/06/16 0255  INR 5.85* 1.42 1.12 1.29 1.30    No results for input(s): DDIMER in the last 72 hours.  Cardiac Enzymes  Recent Labs Lab 05/01/16 1239 05/01/16 2000 05/02/16 0100  TROPONINI <0.03 <0.03 <0.03   ------------------------------------------------------------------------------------------------------------------    Component Value Date/Time   BNP 73.7 03/27/2016 0832    Inpatient Medications  Scheduled Meds: . enoxaparin (LOVENOX) injection  60 mg Subcutaneous BID  . insulin aspart  0-5 Units Subcutaneous QHS  .  insulin aspart  0-9 Units Subcutaneous TID WC  . pantoprazole  40 mg Oral Q1200  . traZODone  50 mg Oral QHS  . warfarin  10 mg Oral ONCE-1800  . Warfarin -  Pharmacist Dosing Inpatient   Does not apply q1800   Continuous Infusions:   PRN Meds:.sodium chloride, acetaminophen, fentaNYL (SUBLIMAZE) injection  Micro Results Recent Results (from the past 240 hour(s))  Urine culture     Status: None   Collection Time: 04/30/16 11:39 PM  Result Value Ref Range Status   Specimen Description URINE, CATHETERIZED  Final   Special Requests NONE  Final   Culture NO GROWTH  Final   Report Status 05/02/2016 FINAL  Final  Blood culture (routine x 2)     Status: None (Preliminary result)   Collection Time: 05/01/16 12:25 AM  Result Value Ref Range Status   Specimen Description BLOOD RIGHT FEMORAL CVC  Final   Special Requests BOTTLES DRAWN AEROBIC AND ANAEROBIC  Final   Culture NO GROWTH 4 DAYS  Final   Report Status PENDING  Incomplete  Blood culture (routine x 2)     Status: None (Preliminary result)   Collection Time: 05/01/16 12:40 AM  Result Value Ref Range Status   Specimen Description BLOOD RIGHT FEMORAL CVC  Final   Special Requests IN PEDIATRIC BOTTLE  Final   Culture NO GROWTH 4 DAYS  Final   Report Status PENDING  Incomplete  MRSA PCR Screening     Status: None   Collection Time: 05/01/16  1:23 AM  Result Value Ref Range Status   MRSA by PCR NEGATIVE NEGATIVE Final    Comment:        The GeneXpert MRSA Assay (FDA approved for NASAL specimens only), is one component of a comprehensive MRSA colonization surveillance program. It is not intended to diagnose MRSA infection nor to guide or monitor treatment for MRSA infections.   Culture, respiratory (NON-Expectorated)     Status: None   Collection Time: 05/01/16  1:00 PM  Result Value Ref Range Status   Specimen Description TRACHEAL ASPIRATE  Final   Special Requests Normal  Final   Gram Stain   Final    FEW WBC  PRESENT,BOTH PMN AND MONONUCLEAR NO ORGANISMS SEEN    Culture NO GROWTH 2 DAYS  Final   Report Status 05/03/2016 FINAL  Final  Culture, blood (Routine X 2) w Reflex to ID Panel     Status: None (Preliminary result)   Collection Time: 05/01/16  6:55 PM  Result Value Ref Range Status   Specimen Description BLOOD RIGHT HAND  Final   Special Requests IN PEDIATRIC BOTTLE 1.5CC  Final   Culture NO GROWTH 4 DAYS  Final   Report Status PENDING  Incomplete  Culture, blood (Routine X 2) w Reflex to ID Panel     Status: None (Preliminary result)   Collection Time: 05/01/16  7:23 PM  Result Value Ref Range Status   Specimen Description BLOOD LEFT ANTECUBITAL  Final   Special Requests IN PEDIATRIC BOTTLE 3CC  Final   Culture NO GROWTH 4 DAYS  Final   Report Status PENDING  Incomplete  Culture, Urine     Status: None   Collection Time: 05/01/16 11:24 PM  Result Value Ref Range Status   Specimen Description URINE, CATHETERIZED  Final   Special Requests NONE  Final   Culture NO GROWTH  Final   Report Status 05/03/2016 FINAL  Final  Culture, respiratory (NON-Expectorated)     Status: None   Collection Time: 05/01/16 11:30 PM  Result Value Ref Range Status   Specimen Description TRACHEAL ASPIRATE  Final   Special Requests Normal  Final   Gram Stain  Final    FEW WBC PRESENT,BOTH PMN AND MONONUCLEAR NO ORGANISMS SEEN    Culture Consistent with normal respiratory flora.  Final   Report Status 05/04/2016 FINAL  Final    Radiology Reports Ct Head Wo Contrast  04/30/2016  CLINICAL DATA:  Acute onset of shortness of breath. Initial encounter. EXAM: CT HEAD WITHOUT CONTRAST TECHNIQUE: Contiguous axial images were obtained from the base of the skull through the vertex without intravenous contrast. COMPARISON:  CT of the head performed 02/26/2016 FINDINGS: There is no evidence of acute infarction, mass lesion, or intra- or extra-axial hemorrhage on CT. Prominence of the ventricles and sulci reflects  mild to moderate cortical volume loss. 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 Diffuse periventricular and subcortical white matter change likely reflects small vessel ischemic microangiopathy. Small chronic infarcts are noted at the cerebellar hemispheres bilaterally, more prominent on the left. The brainstem and fourth ventricle are within normal limits. The basal ganglia are unremarkable in appearance. No mass effect or midline shift is seen. There is no evidence of fracture; visualized osseous structures are unremarkable in appearance. The orbits are within normal limits. The paranasal sinuses and mastoid air cells are well-aerated. No significant soft tissue abnormalities are seen. IMPRESSION: 1. No acute intracranial pathology seen on CT. 2. Mild to moderate cortical volume loss and diffuse small vessel ischemic microangiopathy. 3. 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 4. Small chronic infarcts at the cerebellar hemispheres bilaterally, more prominent on the left. Electronically Signed   By: Roanna Raider M.D.   On: 04/30/2016 23:43   Dg Chest Port 1 View  05/02/2016  CLINICAL DATA:  Hypoxia EXAM: PORTABLE CHEST 1 VIEW COMPARISON:  April 30, 2016 FINDINGS: Endotracheal tube tip is in the right main bronchus. Nasogastric tube tip and side port are in the stomach. No pneumothorax. There is no edema or consolidation. Heart size and pulmonary vascular normal. No adenopathy. IMPRESSION: Endotracheal tube tip in proximal right main bronchus. Advise withdrawing endotracheal tube approximately 4 to 4.5 cm. No pneumothorax. No edema or consolidation. Critical Value/emergent results were called by telephone at the time of interpretation on 05/02/2016 at 7:01 am to Larey Seat,  RN, who verbally acknowledged these results. Electronically Signed   By: Bretta Bang III M.D.   On: 05/02/2016 07:02   Dg Chest Portable 1 View  04/30/2016  CLINICAL DATA:  Unresponsive patient. Endotracheal tube and enteric tubes were placed. Shortness of breath. EXAM: PORTABLE CHEST 1 VIEW COMPARISON:  03/26/2016 FINDINGS: Postoperative changes in the mediastinum. An endotracheal tube has been placed with tip measuring 3 cm above the carina. An enteric tube has been placed with tip in the left upper quadrant consistent with location in the upper stomach. Normal heart size and pulmonary vascularity. No focal airspace disease or consolidation in the lungs. No blunting of costophrenic angles. No pneumothorax. Mediastinal contours appear intact. IMPRESSION: Appliances appear to be in satisfactory position. No evidence of active pulmonary disease. Electronically Signed   By: Burman Nieves M.D.   On: 04/30/2016 23:15    Time Spent in minutes  25   Eddie North M.D on 05/06/2016 at 4:07 PM  Between 7am to 7pm - Pager - [phone]  After 7pm go to [email or web address] - password [facility]  [facility] -  Office  [phone]

## 2016-05-07 DIAGNOSIS — E0865 Diabetes mellitus due to underlying condition with hyperglycemia: Secondary | ICD-10-CM

## 2016-05-07 LAB — GLUCOSE, CAPILLARY
GLUCOSE-CAPILLARY: 31 mg/dL — AB (ref 65–99)
GLUCOSE-CAPILLARY: 313 mg/dL — AB (ref 65–99)
GLUCOSE-CAPILLARY: 416 mg/dL — AB (ref 65–99)
GLUCOSE-CAPILLARY: 43 mg/dL — AB (ref 65–99)
Glucose-Capillary: 307 mg/dL — ABNORMAL HIGH (ref 65–99)
Glucose-Capillary: 178 mg/dL — ABNORMAL HIGH (ref 65–99)

## 2016-05-07 LAB — PROTIME-INR
INR: 1.58 — AB (ref 0.00–1.49)
Prothrombin Time: 18.9 seconds — ABNORMAL HIGH (ref 11.6–15.2)

## 2016-05-07 MED ORDER — INSULIN ASPART 100 UNIT/ML ~~LOC~~ SOLN
0.0000 [IU] | Freq: Three times a day (TID) | SUBCUTANEOUS | Status: DC
  Administered 2016-05-07: 3 [IU] via SUBCUTANEOUS
  Administered 2016-05-08: 11 [IU] via SUBCUTANEOUS

## 2016-05-07 MED ORDER — INSULIN ASPART 100 UNIT/ML ~~LOC~~ SOLN
3.0000 [IU] | Freq: Three times a day (TID) | SUBCUTANEOUS | Status: DC
  Administered 2016-05-07 – 2016-05-08 (×2): 3 [IU] via SUBCUTANEOUS

## 2016-05-07 MED ORDER — WARFARIN SODIUM 10 MG PO TABS
10.0000 mg | ORAL_TABLET | Freq: Once | ORAL | Status: AC
  Administered 2016-05-07: 10 mg via ORAL
  Filled 2016-05-07 (×2): qty 1

## 2016-05-07 MED ORDER — INSULIN DETEMIR 100 UNIT/ML ~~LOC~~ SOLN
20.0000 [IU] | Freq: Every day | SUBCUTANEOUS | Status: DC
  Administered 2016-05-07 – 2016-05-08 (×2): 20 [IU] via SUBCUTANEOUS
  Filled 2016-05-07 (×2): qty 0.2

## 2016-05-07 NOTE — Progress Notes (Signed)
Physical Therapy Treatment Patient Details Name: DAUNA ZISKA MRN: [id number] DOB: [birth year] Today's Date: 05/07/2016    History of Present Illness pt is a 55 y/o female with h/o CAD, HTN, MI, CVA, CHF DM (labile) admitted with DKA found by room mate to be altered mentally with rapid shallow breathing.    PT Comments    Progressing well.  Improved gait stability, but still quick to fatigue.  Should be safe at home with the amount of assist available.  Follow Up Recommendations  Home health PT     Equipment Recommendations  None recommended by PT    Recommendations for Other Services       Precautions / Restrictions Precautions Precautions: Fall    Mobility  Bed Mobility Overal bed mobility: Modified Independent Bed Mobility: Supine to Sit     Supine to sit: Modified independent (Device/Increase time)        Transfers Overall transfer level: Needs assistance   Transfers: Sit to/from Stand Sit to Stand: Supervision            Ambulation/Gait Ambulation/Gait assistance: Min guard Ambulation Distance (Feet): 300 Feet Assistive device:  (rail or hand as she tired and fatigued) Gait Pattern/deviations: Step-through pattern Gait velocity: slower Gait velocity interpretation: Below normal speed for age/gender General Gait Details: stable paretic gait   Stairs            Wheelchair Mobility    Modified Rankin (Stroke Patients Only)       Balance Overall balance assessment: Needs assistance   Sitting balance-Leahy Scale: Good       Standing balance-Leahy Scale: Fair                      Cognition Arousal/Alertness: Awake/alert Behavior During Therapy: WFL for tasks assessed/performed Overall Cognitive Status: Within Functional Limits for tasks assessed                      Exercises General Exercises - Lower Extremity Hip ABduction/ADduction: AROM;Strengthening;Both;10 reps;Standing Hip Flexion/Marching:  AROM;Strengthening;Both;10 reps;Standing Toe Raises: AROM;Strengthening;Both;10 reps;Standing Heel Raises: AROM;Strengthening;Both;10 reps;Standing    General Comments        Pertinent Vitals/Pain Pain Assessment: Faces Faces Pain Scale: Hurts little more Pain Location: vague Pain Descriptors / Indicators: Grimacing    Home Living                      Prior Function            PT Goals (current goals can now be found in the care plan section) Acute Rehab PT Goals Patient Stated Goal: back home once ready PT Goal Formulation: With patient Time For Goal Achievement: 05/11/16 Potential to Achieve Goals: Good Progress towards PT goals: Progressing toward goals    Frequency  Min 3X/week    PT Plan Current plan remains appropriate    Co-evaluation             End of Session   Activity Tolerance: Patient tolerated treatment well Patient left: in bed;with call bell/phone within reach     Time: 1630-1650 PT Time Calculation (min) (ACUTE ONLY): 20 min  Charges:  $Gait Training: 8-22 mins                    G Codes:      Tasmine Hipwell, Eliseo Gum 05/07/2016, 4:52 PM

## 2016-05-07 NOTE — Progress Notes (Signed)
PROGRESS NOTE                                                                                                                                                                                                             Patient Demographics:    Madison [name], is a 55 y.o. female, DOB - [birth year], XKG:[id number]  Admit date - 04/30/2016   Admitting Physician Roslynn Amble, MD  Outpatient Primary MD for the patient is No primary care provider on file.  LOS - 7  Outpatient Specialists: none  Chief Complaint  Patient presents with  . 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 She was found unresponsive by her roommate and was intubated in the ED. Patient was hypertensive post intubation admitted to ICU on pressors.  Patient extubated on 6/14 and transferred to hospitalist service on 6/16.   Subjective:   cBG in 400s. denies any symptoms   Assessment  & Plan :   Principal problem Acute hypoxemic respiratory failure (HCC) Possibly due to hypovolemic/septic shock with DKA. Required mechanical ventilation in the ICU. No source of infection. Cultures negative. Discontinued antibiotics.    DKA (diabetic ketoacidoses) (HCC) Recurrent episodes. Managed in the ICU. Multiple hypoglycemic episodes on 6/16 and placed on D5.  Lantus held and placed on D5. Fluid discontinued on 6/18 but patient now hyperglycemic with CBG in 400s. Will resume Lantus at lower dose and add meal coverage. A1c of 9.9 in March.    Diabetic coordinator following. Patient reports that her PCP has made referral to outpatient endocrinology.     Brittle diabetes  Episodes of hypo and hyperglycemia. cbg in 400s ths am. Resume lantus at lower dose and meal coverage.  Septic shock Possibly triggered by DKA. No source of infection. Off abx.    Hypokalemia/hypomagnesemia/hypophosphatemia Replenished  GERD Continue PPI  AVR on Coumadin Dosing per pharmacy. INR subtherapeutic, bridging with lovenox  CAD with history of CABG. Continue statin.  Anxiety and Depression Held celexa given prolonged Qtc. Improved  on repeat EKG.       Code Status : Full code  Family Communication  : None at bedside  Disposition Plan  : Home with Home health PT in a.m. if CBG stable.  Barriers For Discharge : Hyperglycemia  Consults  :  PC CM  Procedures  :  Intubation Head CT  DVT Prophylaxis  : Coumadin  Lab Results  Component Value Date   PLT 177 05/04/2016    Antibiotics  :    Anti-infectives    Start     Dose/Rate Route Frequency Ordered Stop   05/02/16 0600  vancomycin (VANCOCIN) IVPB 1000 mg/200 mL premix  Status:  Discontinued     1,000 mg 200 mL/hr over 60 Minutes Intravenous Every 24 hours 05/01/16 0033 05/03/16 1157   05/01/16 1000  piperacillin-tazobactam (ZOSYN) IVPB 3.375 g  Status:  Discontinued     3.375 g 12.5 mL/hr over 240 Minutes Intravenous Every 8 hours 05/01/16 0033 05/05/16 1531   04/30/16 2330  piperacillin-tazobactam (ZOSYN) IVPB 3.375 g     3.375 g 100 mL/hr over 30 Minutes Intravenous  Once 04/30/16 2325 05/01/16 0116   04/30/16 2330  vancomycin (VANCOCIN) IVPB 1000 mg/200 mL premix     1,000 mg 200 mL/hr over 60 Minutes Intravenous  Once 04/30/16 2325 05/01/16 0157        Objective:   Filed Vitals:   05/06/16 1709 05/06/16 1900 05/07/16 0543 05/07/16 0902  BP: 108/58 99/53 116/70 115/55  Pulse: 71 81 91 79  Temp: 97.8 F (36.6 C) 98.1 F (36.7 C) 97.6 F (36.4 C) 97.6 F  (36.4 C)  TempSrc: Oral Oral Oral Oral  Resp: 18 18 20 18   Height:      Weight:  63.73 kg (140 lb 8 oz)    SpO2: 98% 99% 98% 98%    Wt Readings from Last 3 Encounters:  05/06/16 63.73 kg (140 lb 8 oz)  04/30/16 54.432 kg (120 lb)  04/24/16 54.386 kg (119 lb 14.4 oz)     Intake/Output Summary (Last 24 hours) at 05/07/16 1433 Last data filed at 05/07/16 1231  Gross per 24 hour  Intake   1650 ml  Output      0 ml  Net   1650 ml     Physical Exam  Gen: not in distress,  HEENT:  moist mucosa, supple neck Chest: clear b/l, no added sounds CVS: N S1&S2, no murmurs GI: soft, NT, ND Musculoskeletal: warm, no edema     Data Review:    CBC  Recent Labs Lab 04/30/16 2250  05/01/16 0500 05/01/16 1715 05/02/16 0530 05/03/16 0315 05/04/16 0332  WBC 24.3*  --  27.6* 15.5* 13.1* 9.4 4.7  HGB 9.0*  < > 9.3* 8.7* 8.7* 8.4* 8.6*  HCT 31.4*  < > 30.1* 26.7* 26.6* 26.2* 27.3*  PLT 425*  --  396 288 267 220 177  MCV 95.2  --  88.3 82.9 81.3 84.2 86.9  MCH 27.3  --  27.3 27.0 26.6 27.0 27.4  MCHC 28.7*  --  30.9 32.6 32.7 32.1 31.5  RDW 17.1*  --  16.5* 16.0* 16.3* 16.7* 16.7*  LYMPHSABS 4.9*  --  2.2 1.8 2.5  --   --   MONOABS 1.2*  --  1.1* 1.2* 0.9  --   --   EOSABS 0.0  --  0.0 0.0 0.0  --   --   BASOSABS 0.0  --  0.0 0.0 0.0  --   --   < > =  values in this interval not displayed.  Chemistries   Recent Labs Lab 05/01/16 0155  05/01/16 1715  05/02/16 0100  05/02/16 0800 05/02/16 1606 05/03/16 0315 05/04/16 0332 05/04/16 1330 05/05/16 0450  NA 133*  < > 134*  < > 137  < > 134* 136 139 143  --  141  K 5.3*  < > 4.0  < > 2.7*  < > 3.0* 3.6 2.9* 3.7  --  3.9  CL 103  < > 100*  < > 103  < > 99* 101 105 113*  --  111  CO2 7*  < > 17*  < > 15*  < > 23 28 28 26   --  25  GLUCOSE 832*  < > 364*  < > 257*  < > 238* 118* 104* 32* 58* 85  BUN 16  < > 10  < > 8  < > 6 <5* <5* <5*  --  <5*  CREATININE 1.80*  < > 1.12*  < > 1.53*  < > 1.00 0.79 0.69 0.68  --  0.69    CALCIUM 7.9*  < > 7.5*  < > 7.7*  < > 7.3* 7.4* 7.6* 8.3*  --  8.3*  MG 2.1  < > 2.0  --  2.0  --  1.8 1.9  --  2.0  --   --   AST 39  --   --   --   --   --   --   --   --   --   --   --   ALT 20  --   --   --   --   --   --   --   --   --   --   --   ALKPHOS 123  --   --   --   --   --   --   --   --   --   --   --   BILITOT 1.5*  --   --   --   --   --   --   --   --   --   --   --   < > = values in this interval not displayed. ------------------------------------------------------------------------------------------------------------------ No results for input(s): CHOL, HDL, LDLCALC, TRIG, CHOLHDL, LDLDIRECT in the last 72 hours.  Lab Results  Component Value Date   HGBA1C 9.9* 01/28/2016   ------------------------------------------------------------------------------------------------------------------ No results for input(s): TSH, T4TOTAL, T3FREE, THYROIDAB in the last 72 hours.  Invalid input(s): FREET3 ------------------------------------------------------------------------------------------------------------------ No results for input(s): VITAMINB12, FOLATE, FERRITIN, TIBC, IRON, RETICCTPCT in the last 72 hours.  Coagulation profile  Recent Labs Lab 05/03/16 0315 05/04/16 0928 05/05/16 0450 05/06/16 0255 05/07/16 0456  INR 1.42 1.12 1.29 1.30 1.58*    No results for input(s): DDIMER in the last 72 hours.  Cardiac Enzymes  Recent Labs Lab 05/01/16 1239 05/01/16 2000 05/02/16 0100  TROPONINI <0.03 <0.03 <0.03   ------------------------------------------------------------------------------------------------------------------    Component Value Date/Time   BNP 73.7 03/27/2016 0832    Inpatient Medications  Scheduled Meds: . enoxaparin (LOVENOX) injection  60 mg Subcutaneous BID  . insulin aspart  0-5 Units Subcutaneous QHS  . insulin aspart  0-9 Units Subcutaneous TID WC  . insulin detemir  20 Units Subcutaneous Daily  . pantoprazole  40 mg Oral Q1200   . traZODone  50 mg Oral QHS  . warfarin  10 mg Oral ONCE-1800  . Warfarin - Pharmacist Dosing  Inpatient   Does not apply q1800   Continuous Infusions:   PRN Meds:.sodium chloride, acetaminophen, fentaNYL (SUBLIMAZE) injection  Micro Results Recent Results (from the past 240 hour(s))  Urine culture     Status: None   Collection Time: 04/30/16 11:39 PM  Result Value Ref Range Status   Specimen Description URINE, CATHETERIZED  Final   Special Requests NONE  Final   Culture NO GROWTH  Final   Report Status 05/02/2016 FINAL  Final  Blood culture (routine x 2)     Status: None   Collection Time: 05/01/16 12:25 AM  Result Value Ref Range Status   Specimen Description BLOOD RIGHT FEMORAL CVC  Final   Special Requests BOTTLES DRAWN AEROBIC AND ANAEROBIC  Final   Culture NO GROWTH 5 DAYS  Final   Report Status 05/06/2016 FINAL  Final  Blood culture (routine x 2)     Status: None   Collection Time: 05/01/16 12:40 AM  Result Value Ref Range Status   Specimen Description BLOOD RIGHT FEMORAL CVC  Final   Special Requests IN PEDIATRIC BOTTLE  Final   Culture NO GROWTH 5 DAYS  Final   Report Status 05/06/2016 FINAL  Final  MRSA PCR Screening     Status: None   Collection Time: 05/01/16  1:23 AM  Result Value Ref Range Status   MRSA by PCR NEGATIVE NEGATIVE Final    Comment:        The GeneXpert MRSA Assay (FDA approved for NASAL specimens only), is one component of a comprehensive MRSA colonization surveillance program. It is not intended to diagnose MRSA infection nor to guide or monitor treatment for MRSA infections.   Culture, respiratory (NON-Expectorated)     Status: None   Collection Time: 05/01/16  1:00 PM  Result Value Ref Range Status   Specimen Description TRACHEAL ASPIRATE  Final   Special Requests Normal  Final   Gram Stain   Final    FEW WBC PRESENT,BOTH PMN AND MONONUCLEAR NO ORGANISMS SEEN    Culture NO GROWTH 2 DAYS  Final   Report Status 05/03/2016  FINAL  Final  Culture, blood (Routine X 2) w Reflex to ID Panel     Status: None   Collection Time: 05/01/16  6:55 PM  Result Value Ref Range Status   Specimen Description BLOOD RIGHT HAND  Final   Special Requests IN PEDIATRIC BOTTLE 1.5CC  Final   Culture NO GROWTH 5 DAYS  Final   Report Status 05/06/2016 FINAL  Final  Culture, blood (Routine X 2) w Reflex to ID Panel     Status: None   Collection Time: 05/01/16  7:23 PM  Result Value Ref Range Status   Specimen Description BLOOD LEFT ANTECUBITAL  Final   Special Requests IN PEDIATRIC BOTTLE 3CC  Final   Culture NO GROWTH 5 DAYS  Final   Report Status 05/06/2016 FINAL  Final  Culture, Urine     Status: None   Collection Time: 05/01/16 11:24 PM  Result Value Ref Range Status   Specimen Description URINE, CATHETERIZED  Final   Special Requests NONE  Final   Culture NO GROWTH  Final   Report Status 05/03/2016 FINAL  Final  Culture, respiratory (NON-Expectorated)     Status: None   Collection Time: 05/01/16 11:30 PM  Result Value Ref Range Status   Specimen Description TRACHEAL ASPIRATE  Final   Special Requests Normal  Final   Gram Stain   Final  FEW WBC PRESENT,BOTH PMN AND MONONUCLEAR NO ORGANISMS SEEN    Culture Consistent with normal respiratory flora.  Final   Report Status 05/04/2016 FINAL  Final    Radiology Reports Ct Head Wo Contrast  04/30/2016  CLINICAL DATA:  Acute onset of shortness of breath. Initial encounter. EXAM: CT HEAD WITHOUT CONTRAST TECHNIQUE: Contiguous axial images were obtained from the base of the skull through the vertex without intravenous contrast. COMPARISON:  CT of the head performed 02/26/2016 FINDINGS: There is no evidence of acute infarction, mass lesion, or intra- or extra-axial hemorrhage on CT. Prominence of the ventricles and sulci reflects mild to moderate cortical volume loss. Large chronic infarcts are noted at the right frontal, parietal and occipital lobes, and at the left parietal and  occipital lobes, with associated encephalomalacia. Diffuse periventricular and subcortical white matter change likely reflects small vessel ischemic microangiopathy. Small chronic infarcts are noted at the cerebellar hemispheres bilaterally, more prominent on the left. The brainstem and fourth ventricle are within normal limits. The basal ganglia are unremarkable in appearance. No mass effect or midline shift is seen. There is no evidence of fracture; visualized osseous structures are unremarkable in appearance. The orbits are within normal limits. The paranasal sinuses and mastoid air cells are well-aerated. No significant soft tissue abnormalities are seen. IMPRESSION: 1. No acute intracranial pathology seen on CT. 2. Mild to moderate cortical volume loss and diffuse small vessel ischemic microangiopathy. 3. 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 4. Small chronic infarcts at the cerebellar hemispheres bilaterally, more prominent on the left. Electronically Signed   By: Roanna Raider M.D.   On: 04/30/2016 23:43   Dg Chest Port 1 View  05/02/2016  CLINICAL DATA:  Hypoxia EXAM: PORTABLE CHEST 1 VIEW COMPARISON:  April 30, 2016 FINDINGS: Endotracheal tube tip is in the right main bronchus. Nasogastric tube tip and side port are in the stomach. No pneumothorax. There is no edema or consolidation. Heart size and pulmonary vascular normal. No adenopathy. IMPRESSION: Endotracheal tube tip in proximal right main bronchus. Advise withdrawing endotracheal tube approximately 4 to 4.5 cm. No pneumothorax. No edema or consolidation. Critical Value/emergent results were called by telephone at the time of interpretation on 05/02/2016 at 7:01 am to Larey Seat, RN, who verbally acknowledged these results. Electronically Signed   By: Bretta Bang III M.D.   On: 05/02/2016 07:02   Dg Chest Portable 1  View  04/30/2016  CLINICAL DATA:  Unresponsive patient. Endotracheal tube and enteric tubes were placed. Shortness of breath. EXAM: PORTABLE CHEST 1 VIEW COMPARISON:  03/26/2016 FINDINGS: Postoperative changes in the mediastinum. An endotracheal tube has been placed with tip measuring 3 cm above the carina. An enteric tube has been placed with tip in the left upper quadrant consistent with location in the upper stomach. Normal heart size and pulmonary vascularity. No focal airspace disease or consolidation in the lungs. No blunting of costophrenic angles. No pneumothorax. Mediastinal contours appear intact. IMPRESSION: Appliances appear to be in satisfactory position. No evidence of active pulmonary disease. Electronically Signed   By: Burman Nieves M.D.   On: 04/30/2016 23:15    Time Spent in minutes  25   Eddie North M.D on 05/07/2016 at 2:33 PM  Between 7am to 7pm - Pager - [phone]  After 7pm go to [email or web address] - password [facility]  [facility] -  Office  [phone]

## 2016-05-07 NOTE — Progress Notes (Signed)
ANTICOAGULATION CONSULT NOTE - Follow Up Consult  Pharmacy Consult for Coumadin and Lovenox Indication: mechanical AVR  Allergies  Allergen Reactions  . Adhesive [Tape] Other (See Comments)    Can burn skin if left on too long  . Celebrex [Celecoxib] Rash  . Detrol [Tolterodine] Hives    Patient Measurements: Height: 5\' 3"  (160 cm) Weight: 140 lb 8 oz (63.73 kg) IBW/kg (Calculated) : 52.4  Vital Signs: Temp: 97.6 F (36.4 C) (06/19 0902) Temp Source: Oral (06/19 0902) BP: 115/55 mmHg (06/19 0902) Pulse Rate: 79 (06/19 0902)  Labs:  Recent Labs  05/05/16 0450 05/06/16 0255 05/07/16 0456  LABPROT 16.2* 16.3* 18.9*  INR 1.29 1.30 1.58*  CREATININE 0.69  --   --     Estimated Creatinine Clearance: 71.4 mL/min (by C-G formula based on Cr of 0.69).  Assessment:  On long-term anticoagulaion for mechanical AVR.  INR 5.4 on admission 6/14 and Vitamin K 10 mg IV given.   Coumadin resumed on 6/15 and Lovenox bridge added 6/16.  INR remains subtherapeutic (1.58) but now increasing again after Coumadin 7.5 mg x 2 days then 10 mg x 2 days.  Last CBC 6/16, Hgb 8.6, platelet count 177.   Home Coumadin regimen: 7.5 mg daily  Goal of Therapy:  Anti-Xa level 0.6-1 units/ml 4hrs after LMWH dose given INR 2.5-3.5 Monitor platelets by anticoagulation protocol: Yes   Plan:   Coumadin 10 mg again today.  Continue Lovenox 60 mg sq q12hrs.  Daily PT/INR.  CBC in am.  7/16, RPh Pager: [phone] 05/07/2016,2:28 PM

## 2016-05-07 NOTE — Care Management Important Message (Signed)
Important Message  Patient Details  Name: Madison [name] MRN: [id number] Date of Birth: 02-Dec-1960   Medicare Important Message Given:  Yes    Bernadette Hoit 05/07/2016, 10:31 AM

## 2016-05-08 ENCOUNTER — Other Ambulatory Visit: Payer: Self-pay | Admitting: *Deleted

## 2016-05-08 DIAGNOSIS — E162 Hypoglycemia, unspecified: Secondary | ICD-10-CM

## 2016-05-08 DIAGNOSIS — R791 Abnormal coagulation profile: Secondary | ICD-10-CM

## 2016-05-08 DIAGNOSIS — J9601 Acute respiratory failure with hypoxia: Secondary | ICD-10-CM

## 2016-05-08 DIAGNOSIS — E0810 Diabetes mellitus due to underlying condition with ketoacidosis without coma: Secondary | ICD-10-CM

## 2016-05-08 DIAGNOSIS — R6520 Severe sepsis without septic shock: Secondary | ICD-10-CM

## 2016-05-08 DIAGNOSIS — E108 Type 1 diabetes mellitus with unspecified complications: Secondary | ICD-10-CM

## 2016-05-08 DIAGNOSIS — E876 Hypokalemia: Secondary | ICD-10-CM

## 2016-05-08 DIAGNOSIS — A419 Sepsis, unspecified organism: Principal | ICD-10-CM

## 2016-05-08 DIAGNOSIS — E109 Type 1 diabetes mellitus without complications: Secondary | ICD-10-CM | POA: Diagnosis present

## 2016-05-08 DIAGNOSIS — E1065 Type 1 diabetes mellitus with hyperglycemia: Secondary | ICD-10-CM

## 2016-05-08 LAB — PROTIME-INR
INR: 1.95 — ABNORMAL HIGH (ref 0.00–1.49)
Prothrombin Time: 22.1 seconds — ABNORMAL HIGH (ref 11.6–15.2)

## 2016-05-08 LAB — CBC
HEMATOCRIT: 28 % — AB (ref 36.0–46.0)
Hemoglobin: 8.8 g/dL — ABNORMAL LOW (ref 12.0–15.0)
MCH: 27.8 pg (ref 26.0–34.0)
MCHC: 31.4 g/dL (ref 30.0–36.0)
MCV: 88.3 fL (ref 78.0–100.0)
PLATELETS: 214 10*3/uL (ref 150–400)
RBC: 3.17 MIL/uL — ABNORMAL LOW (ref 3.87–5.11)
RDW: 16.7 % — AB (ref 11.5–15.5)
WBC: 4.4 10*3/uL (ref 4.0–10.5)

## 2016-05-08 LAB — GLUCOSE, CAPILLARY
GLUCOSE-CAPILLARY: 144 mg/dL — AB (ref 65–99)
GLUCOSE-CAPILLARY: 94 mg/dL (ref 65–99)
Glucose-Capillary: 107 mg/dL — ABNORMAL HIGH (ref 65–99)
Glucose-Capillary: 205 mg/dL — ABNORMAL HIGH (ref 65–99)
Glucose-Capillary: 346 mg/dL — ABNORMAL HIGH (ref 65–99)

## 2016-05-08 MED ORDER — INSULIN DETEMIR 100 UNIT/ML ~~LOC~~ SOLN: 20.0000 [IU] | Freq: Every day | SUBCUTANEOUS | Status: DC

## 2016-05-08 NOTE — Progress Notes (Addendum)
Patient Discharge: Disposition: patient discharged home. Education: reviewed medications, prescriptions, discharge instructions, and follow-up appointments, understood and acknowledged. IO:[id number] IV before discharge.  Site clean and dry. Telemetry: N/A Transportation: Patient transported in w/c accompanied by the staff out of the unit. Belongings: patient took all her belongings with her.

## 2016-05-08 NOTE — Discharge Summary (Addendum)
Physician Discharge Summary  Madison [name] PFX:[id number] DOB: 12-Jun-1961 DOA: 04/30/2016  PCP: Dorothyann Peng, NP  Admit date: 04/30/2016 Discharge date: 05/08/2016  Admitted From: HOME Disposition:  [place]  Recommendations for Outpatient Follow-up:  1. Follow up with PCP On 6/27 at 2 PM. Please make a referral for outpatient endocrinology. 2. Extreme concern for poor adherence to CBG monitoring and use of insulin. Patient has had recurrent hospitalization for DKA in the past few months. She would benefit from going to assisted living but clearly refuses. Patient is at extremely high risk for recurrent DKA's and he hospitalization.  Home Health: RN and PT Equipment/Devices: None  Discharge Condition: Guarded CODE STATUS: Full code Diet recommendation: Diabetic   Discharge Diagnoses:  Active Problems:   Severe sepsis with acute organ dysfunction (HCC)  Active issues    DKA (diabetic ketoacidoses) ([place])   Uncontrolled type 1 diabetes mellitus with ketoacidotic coma (HCC)   Type I diabetes mellitus with complication, uncontrolled ([place])   S/P aortic valve replacement   Supratherapeutic INR   Acute hypoxemic respiratory failure (HCC)   Hypokalemia  hypomagnesemia  Brief narrative 55 year old female with history of CAD status post CABG, hypertension, aortic stenosis status post AVR on Coumadin, 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 She was found unresponsive by her roommate and was intubated in the ED. Patient was hypertensive post intubation admitted to ICU on pressors.  Patient extubated on 6/14 and transferred to hospitalist service on 6/16.   Principal problem Acute hypoxemic respiratory failure  ([place]) Possibly due to hypovolemic/septic shock with DKA. Required mechanical ventilation in the ICU. No source of infection. Cultures negative. Discontinued antibiotics.   DKA (diabetic ketoacidoses) (HCC) Recurrent episodes. Managed in the ICU. Multiple hypoglycemic episodes on 6/16 and placed on D5.  CBG better but still fluctuating from as low as 40s-250s. I will discharge her on reduced dose of Lantus 20 units daily with sensitive sliding scale coverage. A1c of 9.9 in March.  Patient reports that her PCP has made referral to outpatient endocrinology. It is not clear. I have made an appointment with her PCP in 1 week. She definitely needs an outpatient endocrinology referral if not done yet.    Brittle diabetes  Episodes of hypo and hyperglycemia. CBG fluctuating between 40-250s. Will be discharged on 20 units of Lantus with insidious sliding scale coverage.  Septic shock Non infectious etiology. Possibly triggered by DKA.   Off abx.   Hypokalemia/hypomagnesemia/hypophosphatemia Replenished  GERD Continue PPI  AVR on Coumadin Received bridging with Lovenox for subtherapeutic INR. 1.95 today. Resume home dose of Coumadin upon discharge.  CAD with history of CABG. Continue statin.  Anxiety and Depression Held celexa given prolonged Qtc while in the hospital. Improved on repeat EKG.   Home health as recommended by PT. Patient has had recurrent hospitalization for DKA's in the past few months. I was informed that [facility] has been closely following her as outpatient and patient also receives home health services including home health aide. It is concerning that patient is not well adherent with monitoring her blood glucose and likely not taking her insulin as prescribed regularly. Patient lives with her roommate and definitely needs further help regarding adequate blood glucose control. Discussed options of assisted living and patient clearly refuses stating that she has been  compliant with checking her sugars and her medications as prescribed and she would not want to go to assisted living. Discussed in detail about risk to her health with recurrent DKA's, physical deconditioning and patient verbalizes understanding.  Patient at very high risk of rehospitalization and recurrent DKA with life threatening  complications.   Code Status : Full code  Family Communication : None at bedside  Disposition Plan : Home with Home health RN and PT.     Consults : PCCM  Procedures :  Intubation Head CT   Discharge Instructions     Medication List    STOP taking these medications        HYDROcodone-acetaminophen 5-325 MG tablet  Commonly known as:  NORCO/VICODIN      TAKE these medications        citalopram 40 MG tablet  Commonly known as:  CELEXA  Take 1 tablet (40 mg total) by mouth daily.     glucose blood test strip  Use as instructed     insulin aspart 100 UNIT/ML FlexPen  Commonly known as:  NOVOLOG FLEXPEN  Inject 0-9 Units into the skin 3 (three) times daily with meals. CBG < 70: eat or drink something and recheck, CBG 70 - 120: 0 units CBG 121 - 150: 2 unit CBG 151 - 200: 3 units CBG 201 - 250: 5 units CBG 251 - 300:  6units CBG 301 - 350:  7units CBG 351 - 400: 10 units CBG > 400: call MD.     insulin detemir 100 UNIT/ML injection  Commonly known as:  LEVEMIR  Inject 0.2 mLs (20 Units total) into the skin daily.     omeprazole 40 MG capsule  Commonly known as:  PRILOSEC  TAKE ONE CAPSULE BY MOUTH EVERY DAY     ondansetron 4 MG tablet  Commonly known as:  ZOFRAN  Take 4 mg by mouth every 4 (four) hours.     [place] w/Device Kit  Use to check glucose 2x per day     traZODone 50 MG tablet  Commonly known as:  DESYREL  Take 50 mg by mouth at bedtime.     warfarin 7.5 MG tablet  Commonly known as:  COUMADIN  Take 7.5 mg by mouth See admin instructions.           Follow-up Information    Follow up with  Dorothyann Peng, NP On 05/15/2016.   Specialty:  Family Medicine   Why:  at 2 pm   Contact information:   [place] [id number] [phone]      Allergies  Allergen Reactions  . Adhesive [Tape] Other (See Comments)    Can burn skin if left on too long  . Celebrex [Celecoxib] Rash  . Detrol [Tolterodine] Hives      Procedures/Studies: Ct Head Wo Contrast  04/30/2016  CLINICAL DATA:  Acute onset of shortness of breath. Initial encounter. EXAM: CT HEAD WITHOUT CONTRAST TECHNIQUE: Contiguous axial images were obtained from the base of the skull through the vertex without intravenous contrast. COMPARISON:  CT of the head performed 02/26/2016 FINDINGS: There is no evidence of acute infarction, mass lesion, or intra- or extra-axial hemorrhage on CT. Prominence of the ventricles and sulci reflects mild to moderate cortical volume loss. 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 Diffuse periventricular and subcortical white matter change likely reflects small  vessel ischemic microangiopathy. Small chronic infarcts are noted at the cerebellar hemispheres bilaterally, more prominent on the left. The brainstem and fourth ventricle are within normal limits. The basal ganglia are unremarkable in appearance. No mass effect or midline shift is seen. There is no evidence of fracture; visualized osseous structures are unremarkable in appearance. The orbits are within normal limits. The paranasal sinuses and mastoid air cells are well-aerated. No significant soft tissue abnormalities are seen. IMPRESSION: 1. No acute intracranial pathology seen on CT. 2. Mild to moderate cortical volume loss and diffuse small vessel ischemic microangiopathy. 3. Large chronic infarcts at the right frontal, parietal and occipital lobes, and at the left parietal and occipital lobes, with  associated encephalomalacia. 4. Small chronic infarcts at the cerebellar hemispheres bilaterally, more prominent on the left. Electronically Signed   By: Garald Balding M.D.   On: 04/30/2016 23:43   Dg Chest Port 1 View  05/02/2016  CLINICAL DATA:  Hypoxia EXAM: PORTABLE CHEST 1 VIEW COMPARISON:  April 30, 2016 FINDINGS: Endotracheal tube tip is in the right main bronchus. Nasogastric tube tip and side port are in the stomach. No pneumothorax. There is no edema or consolidation. Heart size and pulmonary vascular normal. No adenopathy. IMPRESSION: Endotracheal tube tip in proximal right main bronchus. Advise withdrawing endotracheal tube approximately 4 to 4.5 cm. No pneumothorax. No edema or consolidation. Critical Value/emergent results were called by telephone at the time of interpretation on 05/02/2016 at 7:01 am to Arletta Bale, RN, who verbally acknowledged these results. Electronically Signed   By: Lowella Grip III M.D.   On: 05/02/2016 07:02   Dg Chest Portable 1 View  04/30/2016  CLINICAL DATA:  Unresponsive patient. Endotracheal tube and enteric tubes were placed. Shortness of breath. EXAM: PORTABLE CHEST 1 VIEW COMPARISON:  03/26/2016 FINDINGS: Postoperative changes in the mediastinum. An endotracheal tube has been placed with tip measuring 3 cm above the carina. An enteric tube has been placed with tip in the left upper quadrant consistent with location in the upper stomach. Normal heart size and pulmonary vascularity. No focal airspace disease or consolidation in the lungs. No blunting of costophrenic angles. No pneumothorax. Mediastinal contours appear intact. IMPRESSION: Appliances appear to be in satisfactory position. No evidence of active pulmonary disease. Electronically Signed   By: Lucienne Capers M.D.   On: 04/30/2016 23:15    (Echo, Carotid, EGD, Colonoscopy, ERCP)    Subjective:   Discharge Exam: Filed Vitals:   05/08/16 0359 05/08/16 0900  BP: 127/53 115/70  Pulse: 57  65  Temp: 98.1 F (36.7 C) 98 F (36.7 C)  Resp: 16 16   Filed Vitals:   05/07/16 1834 05/07/16 2018 05/08/16 0359 05/08/16 0900  BP: 107/58 112/55 127/53 115/70  Pulse: 103 73 57 65  Temp: 98.6 F (37 C) 98 F (36.7 C) 98.1 F (36.7 C) 98 F (36.7 C)  TempSrc: Oral Oral Oral Oral  Resp: '18 16 16 16  ' Height:      Weight:  58.06 kg (128 lb)    SpO2: 100% 100% 99% 99%    General: Pt is alert, awake, not in acute distress Cardiovascular: RRR, S1/S2 +, no rubs, no gallops Respiratory: CTA bilaterally, no wheezing, no rhonchi Abdominal: Soft, NT, ND, bowel sounds + Extremities: no edema, no cyanosis    The results of significant diagnostics from this hospitalization (including imaging, microbiology, ancillary and laboratory) are listed below for reference.     Microbiology: Recent Results (from the past 240 hour(s))  Urine culture     Status: None   Collection Time: 04/30/16 11:39 PM  Result Value Ref Range Status   Specimen Description URINE, CATHETERIZED  Final   Special Requests NONE  Final   Culture NO GROWTH  Final   Report Status 05/02/2016 FINAL  Final  Blood culture (routine x 2)     Status: None   Collection Time: 05/01/16 12:25 AM  Result Value Ref Range Status   Specimen Description BLOOD RIGHT FEMORAL CVC  Final   Special Requests BOTTLES DRAWN AEROBIC AND ANAEROBIC 5ML  Final   Culture NO GROWTH 5 DAYS  Final   Report Status 05/06/2016 FINAL  Final  Blood culture (routine x 2)     Status: None   Collection Time: 05/01/16 12:40 AM  Result Value Ref Range Status   Specimen Description BLOOD RIGHT FEMORAL CVC  Final   Special Requests IN PEDIATRIC BOTTLE 5ML  Final   Culture NO GROWTH 5 DAYS  Final   Report Status 05/06/2016 FINAL  Final  MRSA PCR Screening     Status: None   Collection Time: 05/01/16  1:23 AM  Result Value Ref Range Status   MRSA by PCR NEGATIVE NEGATIVE Final    Comment:        The GeneXpert MRSA Assay (FDA approved for NASAL  specimens only), is one component of a comprehensive MRSA colonization surveillance program. It is not intended to diagnose MRSA infection nor to guide or monitor treatment for MRSA infections.   Culture, respiratory (NON-Expectorated)     Status: None   Collection Time: 05/01/16  1:00 PM  Result Value Ref Range Status   Specimen Description TRACHEAL ASPIRATE  Final   Special Requests Normal  Final   Gram Stain   Final    FEW WBC PRESENT,BOTH PMN AND MONONUCLEAR NO ORGANISMS SEEN    Culture NO GROWTH 2 DAYS  Final   Report Status 05/03/2016 FINAL  Final  Culture, blood (Routine X 2) w Reflex to ID Panel     Status: None   Collection Time: 05/01/16  6:55 PM  Result Value Ref Range Status   Specimen Description BLOOD RIGHT HAND  Final   Special Requests IN PEDIATRIC BOTTLE 1.5CC  Final   Culture NO GROWTH 5 DAYS  Final   Report Status 05/06/2016 FINAL  Final  Culture, blood (Routine X 2) w Reflex to ID Panel     Status: None   Collection Time: 05/01/16  7:23 PM  Result Value Ref Range Status   Specimen Description BLOOD LEFT ANTECUBITAL  Final   Special Requests IN PEDIATRIC BOTTLE 3CC  Final   Culture NO GROWTH 5 DAYS  Final   Report Status 05/06/2016 FINAL  Final  Culture, Urine     Status: None   Collection Time: 05/01/16 11:24 PM  Result Value Ref Range Status   Specimen Description URINE, CATHETERIZED  Final   Special Requests NONE  Final   Culture NO GROWTH  Final   Report Status 05/03/2016 FINAL  Final  Culture, respiratory (NON-Expectorated)     Status: None   Collection Time: 05/01/16 11:30 PM  Result Value Ref Range Status   Specimen Description TRACHEAL ASPIRATE  Final   Special Requests Normal  Final   Gram Stain   Final    FEW WBC PRESENT,BOTH PMN AND MONONUCLEAR NO ORGANISMS SEEN    Culture Consistent with normal respiratory flora.  Final   Report Status 05/04/2016 FINAL  Final  Labs: BNP (last 3 results)  Recent Labs  07/30/15 1400  03/17/16 1801 03/27/16 0832  BNP 398.4* 95.3 01.7   Basic Metabolic Panel:  Recent Labs Lab 05/01/16 1715  05/02/16 0100  05/02/16 0800 05/02/16 1606 05/03/16 0315 05/04/16 0332 05/04/16 1330 05/05/16 0450  NA 134*  < > 137  < > 134* 136 139 143  --  141  K 4.0  < > 2.7*  < > 3.0* 3.6 2.9* 3.7  --  3.9  CL 100*  < > 103  < > 99* 101 105 113*  --  111  CO2 17*  < > 15*  < > '23 28 28 26  ' --  25  GLUCOSE 364*  < > 257*  < > 238* 118* 104* 32* 58* 85  BUN 10  < > 8  < > 6 <5* <5* <5*  --  <5*  CREATININE 1.12*  < > 1.53*  < > 1.00 0.79 0.69 0.68  --  0.69  CALCIUM 7.5*  < > 7.7*  < > 7.3* 7.4* 7.6* 8.3*  --  8.3*  MG 2.0  --  2.0  --  1.8 1.9  --  2.0  --   --   PHOS 3.5  --  2.7  --  1.7* 3.9  --  2.7  --   --   < > = values in this interval not displayed. Liver Function Tests: No results for input(s): AST, ALT, ALKPHOS, BILITOT, PROT, ALBUMIN in the last 168 hours. No results for input(s): LIPASE, AMYLASE in the last 168 hours. No results for input(s): AMMONIA in the last 168 hours. CBC:  Recent Labs Lab 05/01/16 1715 05/02/16 0530 05/03/16 0315 05/04/16 0332 05/08/16 0803  WBC 15.5* 13.1* 9.4 4.7 4.4  NEUTROABS 12.5* 9.7*  --   --   --   HGB 8.7* 8.7* 8.4* 8.6* 8.8*  HCT 26.7* 26.6* 26.2* 27.3* 28.0*  MCV 82.9 81.3 84.2 86.9 88.3  PLT 288 267 220 177 214   Cardiac Enzymes:  Recent Labs Lab 05/01/16 1239 05/01/16 2000 05/02/16 0100  TROPONINI <0.03 <0.03 <0.03   BNP: Invalid input(s): POCBNP CBG:  Recent Labs Lab 05/07/16 2022 05/07/16 2159 05/08/16 0009 05/08/16 0337 05/08/16 0743  GLUCAP 43* 107* 205* 144* 94   D-Dimer No results for input(s): DDIMER in the last 72 hours. Hgb A1c No results for input(s): HGBA1C in the last 72 hours. Lipid Profile No results for input(s): CHOL, HDL, LDLCALC, TRIG, CHOLHDL, LDLDIRECT in the last 72 hours. Thyroid function studies No results for input(s): TSH, T4TOTAL, T3FREE, THYROIDAB in the last 72  hours.  Invalid input(s): FREET3 Anemia work up No results for input(s): VITAMINB12, FOLATE, FERRITIN, TIBC, IRON, RETICCTPCT in the last 72 hours. Urinalysis    Component Value Date/Time   COLORURINE YELLOW 04/30/2016 2339   APPEARANCEUR CLEAR 04/30/2016 2339   LABSPEC 1.025 04/30/2016 2339   PHURINE 5.0 04/30/2016 2339   GLUCOSEU >1000* 04/30/2016 2339   HGBUR NEGATIVE 04/30/2016 2339   BILIRUBINUR NEGATIVE 04/30/2016 2339   BILIRUBINUR n 06/16/2015 1244   KETONESUR >80* 04/30/2016 2339   PROTEINUR NEGATIVE 04/30/2016 2339   PROTEINUR n 06/16/2015 1244   UROBILINOGEN 0.2 07/30/2015 1415   UROBILINOGEN 0.2 06/16/2015 1244   NITRITE NEGATIVE 04/30/2016 2339   NITRITE n 06/16/2015 1244   LEUKOCYTESUR NEGATIVE 04/30/2016 2339   Sepsis Labs Invalid input(s): PROCALCITONIN,  WBC,  LACTICIDVEN Microbiology Recent Results (from the past 240 hour(s))  Urine culture  Status: None   Collection Time: 04/30/16 11:39 PM  Result Value Ref Range Status   Specimen Description URINE, CATHETERIZED  Final   Special Requests NONE  Final   Culture NO GROWTH  Final   Report Status 05/02/2016 FINAL  Final  Blood culture (routine x 2)     Status: None   Collection Time: 05/01/16 12:25 AM  Result Value Ref Range Status   Specimen Description BLOOD RIGHT FEMORAL CVC  Final   Special Requests BOTTLES DRAWN AEROBIC AND ANAEROBIC 5ML  Final   Culture NO GROWTH 5 DAYS  Final   Report Status 05/06/2016 FINAL  Final  Blood culture (routine x 2)     Status: None   Collection Time: 05/01/16 12:40 AM  Result Value Ref Range Status   Specimen Description BLOOD RIGHT FEMORAL CVC  Final   Special Requests IN PEDIATRIC BOTTLE 5ML  Final   Culture NO GROWTH 5 DAYS  Final   Report Status 05/06/2016 FINAL  Final  MRSA PCR Screening     Status: None   Collection Time: 05/01/16  1:23 AM  Result Value Ref Range Status   MRSA by PCR NEGATIVE NEGATIVE Final    Comment:        The GeneXpert MRSA Assay  (FDA approved for NASAL specimens only), is one component of a comprehensive MRSA colonization surveillance program. It is not intended to diagnose MRSA infection nor to guide or monitor treatment for MRSA infections.   Culture, respiratory (NON-Expectorated)     Status: None   Collection Time: 05/01/16  1:00 PM  Result Value Ref Range Status   Specimen Description TRACHEAL ASPIRATE  Final   Special Requests Normal  Final   Gram Stain   Final    FEW WBC PRESENT,BOTH PMN AND MONONUCLEAR NO ORGANISMS SEEN    Culture NO GROWTH 2 DAYS  Final   Report Status 05/03/2016 FINAL  Final  Culture, blood (Routine X 2) w Reflex to ID Panel     Status: None   Collection Time: 05/01/16  6:55 PM  Result Value Ref Range Status   Specimen Description BLOOD RIGHT HAND  Final   Special Requests IN PEDIATRIC BOTTLE 1.5CC  Final   Culture NO GROWTH 5 DAYS  Final   Report Status 05/06/2016 FINAL  Final  Culture, blood (Routine X 2) w Reflex to ID Panel     Status: None   Collection Time: 05/01/16  7:23 PM  Result Value Ref Range Status   Specimen Description BLOOD LEFT ANTECUBITAL  Final   Special Requests IN PEDIATRIC BOTTLE 3CC  Final   Culture NO GROWTH 5 DAYS  Final   Report Status 05/06/2016 FINAL  Final  Culture, Urine     Status: None   Collection Time: 05/01/16 11:24 PM  Result Value Ref Range Status   Specimen Description URINE, CATHETERIZED  Final   Special Requests NONE  Final   Culture NO GROWTH  Final   Report Status 05/03/2016 FINAL  Final  Culture, respiratory (NON-Expectorated)     Status: None   Collection Time: 05/01/16 11:30 PM  Result Value Ref Range Status   Specimen Description TRACHEAL ASPIRATE  Final   Special Requests Normal  Final   Gram Stain   Final    FEW WBC PRESENT,BOTH PMN AND MONONUCLEAR NO ORGANISMS SEEN    Culture Consistent with normal respiratory flora.  Final   Report Status 05/04/2016 FINAL  Final     Time coordinating discharge: Over 44  minutes  SIGNED:   Louellen Molder, MD  [facility] 05/08/2016, 11:33 AM Pager   If 7PM-7AM, please contact night-coverage [email or web address] Password [email or web address]

## 2016-05-08 NOTE — Progress Notes (Addendum)
Inpatient Diabetes Program Recommendations  AACE/ADA: New Consensus Statement on Inpatient Glycemic Control (2015)  Target Ranges:  Prepandial:   less than 140 mg/dL      Peak postprandial:   less than 180 mg/dL (1-2 hours)      Critically ill patients:  140 - 180 mg/dL   Results for LATORSHA, CURLING (MRN [id number]) as of 05/08/2016 13:10  Ref. Range 05/08/2016 07:43 05/08/2016 12:12  Glucose-Capillary Latest Ref Range: 65-99 mg/dL 94 409 (H)    Review of Glycemic Control  Diabetes history: DM  Outpatient Diabetes medications: Levemir 30 units daily (just increased 6/12 from 25 units daily)+ Novolog correction scale CBG 70 - 120: 0 units  CBG 121 - 150: 2 unit  CBG 151 - 200: 3 units  CBG 201 - 250: 5 units  CBG 251 - 300: 6units  CBG 301 - 350: 7units  CBG 351 - 400: 10 units  CBG > 400: call MD. + ? Novolog 5 units tid meal coverage Current orders for Inpatient glycemic control: Novolog 4 units tid with meals + Novolog correction 0-15 units tid + 0-5 units hs  Inpatient Diabetes Program Recommendations:  Patient did not receive meal coverage this am per [facility] due to patient not eating much. Please consider decrease in Novolog correction to sensitive scale 0-9 units. Request sent via text page to Dr. Gonzella Lex.  Thank you, Billy Fischer. Lakshmi Sundeen, RN, MSN, CDE Inpatient Glycemic Control Team Team Pager [phone] (8am-5pm) 05/08/2016 1:12 PM

## 2016-05-08 NOTE — Discharge Instructions (Addendum)
Blood Glucose Monitoring, Adult °Monitoring your blood glucose (also know as blood sugar) helps you to manage your diabetes. It also helps you and your health care provider monitor your diabetes and determine how well your treatment plan is working. °WHY SHOULD YOU MONITOR YOUR BLOOD GLUCOSE? °· It can help you understand how food, exercise, and medicine affect your blood glucose. °· It allows you to know what your blood glucose is at any given moment. You can quickly tell if you are having low blood glucose (hypoglycemia) or high blood glucose (hyperglycemia). °· It can help you and your health care provider know how to adjust your medicines. °· It can help you understand how to manage an illness or adjust medicine for exercise. °WHEN SHOULD YOU TEST? °Your health care provider will help you decide how often you should check your blood glucose. This may depend on the type of diabetes you have, your diabetes control, or the types of medicines you are taking. Be sure to write down all of your blood glucose readings so that this information can be reviewed with your health care provider. See below for examples of testing times that your health care provider may suggest. °Type 1 Diabetes °· Test at least 2 times per day if your diabetes is well controlled, if you are using an insulin pump, or if you perform multiple daily injections. °· If your diabetes is not well controlled or if you are sick, you may need to test more often. °· It is a good idea to also test: °¨ Before every insulin injection. °¨ Before and after exercise. °¨ Between meals and 2 hours after a meal. °¨ Occasionally between 2:00 a.m. and 3:00 a.m. °Type 2 Diabetes °· If you are taking insulin, test at least 2 times per day. However, it is best to test before every insulin injection. °· If you take medicines by mouth (orally), test 2 times a day. °· If you are on a controlled diet, test once a day. °· If your diabetes is not well controlled or if you  are sick, you may need to monitor more often. °HOW TO MONITOR YOUR BLOOD GLUCOSE °Supplies Needed °· Blood glucose meter. °· Test strips for your meter. Each meter has its own strips. You must use the strips that go with your own meter. °· A pricking needle (lancet). °· A device that holds the lancet (lancing device). °· A journal or log book to write down your results. °Procedure °· Wash your hands with soap and water. Alcohol is not preferred. °· Prick the side of your finger (not the tip) with the lancet. °· Gently milk the finger until a small drop of blood appears. °· Follow the instructions that come with your meter for inserting the test strip, applying blood to the strip, and using your blood glucose meter. °Other Areas to Get Blood for Testing °Some meters allow you to use other areas of your body (other than your finger) to test your blood. These areas are called alternative sites. The most common alternative sites are: °· The forearm. °· The thigh. °· The back area of the lower leg. °· The palm of the hand. °The blood flow in these areas is slower. Therefore, the blood glucose values you get may be delayed, and the numbers are different from what you would get from your fingers. Do not use alternative sites if you think you are having hypoglycemia. Your reading will not be accurate. Always use a finger if you are   having hypoglycemia. Also, if you cannot feel your lows (hypoglycemia unawareness), always use your fingers for your blood glucose checks. ADDITIONAL TIPS FOR GLUCOSE MONITORING  Do not reuse lancets.  Always carry your supplies with you.  All blood glucose meters have a 24-hour "hotline" number to call if you have questions or need help.  Adjust (calibrate) your blood glucose meter with a control solution after finishing a few boxes of strips. BLOOD GLUCOSE RECORD KEEPING It is a good idea to keep a daily record or log of your blood glucose readings. Most glucose meters, if not all,  keep your glucose records stored in the meter. Some meters come with the ability to download your records to your home computer. Keeping a record of your blood glucose readings is especially helpful if you are wanting to look for patterns. Make notes to go along with the blood glucose readings because you might forget what happened at that exact time. Keeping good records helps you and your health care provider to work together to achieve good diabetes management.    This information is not intended to replace advice given to you by your health care provider. Make sure you discuss any questions you have with your health care provider.   Document Released: 11/08/2003 Document Revised: 11/26/2014 Document Reviewed: 03/30/2013 Elsevier Interactive Patient Education [employer]  -----------------------------------------------------------------------------------------------------------------------------------------------------------------------------------------  Information on my medicine - Coumadin   (Warfarin)  This medication education was reviewed with me or my healthcare representative as part of my discharge preparation.  The pharmacist that spoke with me during my hospital stay was:  Dennie Fetters, [facility]  Why was Coumadin prescribed for you? Coumadin was prescribed for you because you have a blood clot or a medical condition that can cause an increased risk of forming blood clots. Blood clots can cause serious health problems by blocking the flow of blood to the heart, lung, or brain. Coumadin can prevent harmful blood clots from forming. As a reminder your indication for Coumadin is:   Blood Clot Prevention After Heart Valve Surgery  What test will check on my response to Coumadin? While on Coumadin (warfarin) you will need to have an INR test regularly to ensure that your dose is keeping you in the desired range. The INR (international normalized ratio) number is calculated from  the result of the laboratory test called prothrombin time (PT).  If an INR APPOINTMENT HAS NOT ALREADY BEEN MADE FOR YOU please schedule an appointment to have this lab work done by your health care provider within 7 days. Your INR goal is usually a number between:  2 to 3 or your provider may give you a more narrow range like 2-2.5.  Ask your health care provider during an office visit what your goal INR is.  What  do you need to  know  About  COUMADIN? Take Coumadin (warfarin) exactly as prescribed by your healthcare provider about the same time each day.  DO NOT stop taking without talking to the doctor who prescribed the medication.  Stopping without other blood clot prevention medication to take the place of Coumadin may increase your risk of developing a new clot or stroke.  Get refills before you run out.  What do you do if you miss a dose? If you miss a dose, take it as soon as you remember on the same day then continue your regularly scheduled regimen the next day.  Do not take two doses of Coumadin at the same time.  Important Safety Information A possible side effect of Coumadin (Warfarin) is an increased risk of bleeding. You should call your healthcare provider right away if you experience any of the following: ? Bleeding from an injury or your nose that does not stop. ? Unusual colored urine (red or dark brown) or unusual colored stools (red or black). ? Unusual bruising for unknown reasons. ? A serious fall or if you hit your head (even if there is no bleeding).  Some foods or medicines interact with Coumadin (warfarin) and might alter your response to warfarin. To help avoid this: ? Eat a balanced diet, maintaining a consistent amount of Vitamin K. ? Notify your provider about major diet changes you plan to make. ? Avoid alcohol or limit your intake to 1 drink for women and 2 drinks for men per day. (1 drink is 5 oz. wine, 12 oz. beer, or 1.5 oz. liquor.)  Make sure that ANY  health care provider who prescribes medication for you knows that you are taking Coumadin (warfarin).  Also make sure the healthcare provider who is monitoring your Coumadin knows when you have started a new medication including herbals and non-prescription products.  Coumadin (Warfarin)  Major Drug Interactions  Increased Warfarin Effect Decreased Warfarin Effect  Alcohol (large quantities) Antibiotics (esp. Septra/Bactrim, Flagyl, Cipro) Amiodarone (Cordarone) Aspirin (ASA) Cimetidine (Tagamet) Megestrol (Megace) NSAIDs (ibuprofen, naproxen, etc.) Piroxicam (Feldene) Propafenone (Rythmol SR) Propranolol (Inderal) Isoniazid (INH) Posaconazole (Noxafil) Barbiturates (Phenobarbital) Carbamazepine (Tegretol) Chlordiazepoxide (Librium) Cholestyramine (Questran) Griseofulvin Oral Contraceptives Rifampin Sucralfate (Carafate) Vitamin K   Coumadin (Warfarin) Major Herbal Interactions  Increased Warfarin Effect Decreased Warfarin Effect  Garlic Ginseng Ginkgo biloba Coenzyme Q10 Green tea St. Johns wort    Coumadin (Warfarin) FOOD Interactions  Eat a consistent number of servings per week of foods HIGH in Vitamin K (1 serving =  cup)  Collards (cooked, or boiled & drained) Kale (cooked, or boiled & drained) Mustard greens (cooked, or boiled & drained) Parsley *serving size only =  cup Spinach (cooked, or boiled & drained) Swiss chard (cooked, or boiled & drained) Turnip greens (cooked, or boiled & drained)  Eat a consistent number of servings per week of foods MEDIUM-HIGH in Vitamin K (1 serving = 1 cup)  Asparagus (cooked, or boiled & drained) Broccoli (cooked, boiled & drained, or raw & chopped) Brussel sprouts (cooked, or boiled & drained) *serving size only =  cup Lettuce, raw (green leaf, endive, romaine) Spinach, raw Turnip greens, raw & chopped   These websites have more information on Coumadin (warfarin):   http://www.king-russell.com/; https://www.hines.net/;

## 2016-05-08 NOTE — Care Management Note (Signed)
Case Management Note  Patient Details  Name: Madison [name] MRN: [id number] Date of Birth: [birth year]  Subjective/Objective:           CM following for progression and d/c planning.          Action/Plan: Pt active with [facility], rep Edwina aware of pt situation and difficulties with CBG testing and insulin administration. This was discussed with Dr Clementeen Graham who then discussed with pt the seriousness of this issure and recommended that pt consider ALF placement for assist with monitoring CBGs and correct administration of insulin. This pt refused this suggestion insisting that she is able to correctly check her blood sugar and adm insulin as needed.  [facility] notified of pt refusal of ALF and plan to return to home. Alvis Lemmings will resume services for this patient. Met with pt and discussed need for close monitoring of CBG and encouraged pt to utilize [facility] for concerns or questions. Pt continues to insist that she monitors her CBG and adm insulin correctly.    Expected Discharge Date:    05/08/2016              Expected Discharge Plan:  [place]  In-House Referral:  NA  Discharge planning Services  CM Consult  Post Acute Care Choice:  Home Health Choice offered to:  Patient  DME Arranged:  N/A DME Agency:  NA  HH Arranged:  RN, PT, OT, Nurse's Aide [place] Agency:  [place]  Status of Service:  Completed, signed off  If discussed at [place] of Stay Meetings, dates discussed:    Additional Comments:  Adron Bene, RN 05/08/2016, 1:05 PM

## 2016-05-08 NOTE — Patient Outreach (Signed)
[facility]) Care Management  05/08/2016  Madison [name] Mar 22, 1961 [id number]   CSW made several attempts to try and contact patient today to follow-up regarding patient's recent readmission into the hospital, as well as possible hospital discharge today, without success.  HIPAA compliant messages were left for patient on her cell phone and her home phone numbers.  CSW was able to confirm that patient was discharged from [facility] today, returning home to live with home health services arranged through [place].  CSW will continue to try and make contact with patient. Danford Bad, BSW, MSW, LCSW  Licensed Restaurant manager, [employer] [facility]  Mailing [place] [address], [place], [place] [address] Physical Address-[address], [place], [place] [address] Toll Free Main # [phone] Fax # [phone] Cell # [phone]  Fax # [phone]  [name]

## 2016-05-09 ENCOUNTER — Other Ambulatory Visit: Payer: Self-pay

## 2016-05-09 ENCOUNTER — Telehealth: Payer: Self-pay | Admitting: Adult Health

## 2016-05-09 DIAGNOSIS — I10 Essential (primary) hypertension: Secondary | ICD-10-CM | POA: Diagnosis not present

## 2016-05-09 NOTE — Telephone Encounter (Signed)
It appears she has had extensive diabetes teaching in hospital.  She must get back on her insulin or she will obviously end up back in hospital.  Can someone with [facility] go out and reinforce meds and help with any further teaching?

## 2016-05-09 NOTE — Patient Outreach (Signed)
Transition of care call completed   Patient stated she was doing fine     Patient admits to not haviher insulin

## 2016-05-09 NOTE — Telephone Encounter (Signed)
Edwina with [facility] called to advise pt has had not had insulin since dc'd from the hospital yesterday.  Pt will tell you a different story. Edwina asked pt to show her how she gives herself insulin, pt was unable to connect the pen in order to give herself her insulin. So she is not giving herself insulin. Pt's blood sugar was 596.  They tried to call us but could not get through.  Edwina called 911, but pt refused to go.  Edwina called [facility] and they are aware [facility] offered her the [facility] program, assisted living, but pt refused. Pt does not want to give up her monthly check.  Sheral Flow is going to drop pt at this time, because there is nothing else she can do for her. Pt is non compliant.

## 2016-05-09 NOTE — Telephone Encounter (Signed)
Can you please provide insight on if notes from call warrant intervention or more of an FYI for provider?

## 2016-05-10 ENCOUNTER — Other Ambulatory Visit: Payer: Self-pay | Admitting: *Deleted

## 2016-05-10 ENCOUNTER — Inpatient Hospital Stay (HOSPITAL_COMMUNITY): Payer: Commercial Managed Care - HMO

## 2016-05-10 ENCOUNTER — Inpatient Hospital Stay (HOSPITAL_COMMUNITY)
Admission: EM | Admit: 2016-05-10 | Discharge: 2016-05-14 | DRG: 871 | Disposition: A | Discharge status: Home Health | Payer: Commercial Managed Care - HMO | Attending: Internal Medicine | Admitting: Internal Medicine

## 2016-05-10 ENCOUNTER — Encounter (HOSPITAL_COMMUNITY): Payer: Self-pay | Admitting: Emergency Medicine

## 2016-05-10 DIAGNOSIS — Z952 Presence of prosthetic heart valve: Secondary | ICD-10-CM | POA: Diagnosis not present

## 2016-05-10 DIAGNOSIS — G8110 Spastic hemiplegia affecting unspecified side: Secondary | ICD-10-CM | POA: Diagnosis not present

## 2016-05-10 DIAGNOSIS — I252 Old myocardial infarction: Secondary | ICD-10-CM

## 2016-05-10 DIAGNOSIS — I959 Hypotension, unspecified: Secondary | ICD-10-CM | POA: Diagnosis not present

## 2016-05-10 DIAGNOSIS — IMO0002 Reserved for concepts with insufficient information to code with codable children: Secondary | ICD-10-CM

## 2016-05-10 DIAGNOSIS — E785 Hyperlipidemia, unspecified: Secondary | ICD-10-CM | POA: Diagnosis present

## 2016-05-10 DIAGNOSIS — E10649 Type 1 diabetes mellitus with hypoglycemia without coma: Secondary | ICD-10-CM | POA: Diagnosis present

## 2016-05-10 DIAGNOSIS — I110 Hypertensive heart disease with heart failure: Secondary | ICD-10-CM | POA: Diagnosis present

## 2016-05-10 DIAGNOSIS — E0810 Diabetes mellitus due to underlying condition with ketoacidosis without coma: Secondary | ICD-10-CM | POA: Diagnosis not present

## 2016-05-10 DIAGNOSIS — F419 Anxiety disorder, unspecified: Secondary | ICD-10-CM | POA: Diagnosis present

## 2016-05-10 DIAGNOSIS — Z794 Long term (current) use of insulin: Secondary | ICD-10-CM | POA: Diagnosis not present

## 2016-05-10 DIAGNOSIS — R4182 Altered mental status, unspecified: Secondary | ICD-10-CM | POA: Diagnosis present

## 2016-05-10 DIAGNOSIS — Z8672 Personal history of thrombophlebitis: Secondary | ICD-10-CM | POA: Diagnosis not present

## 2016-05-10 DIAGNOSIS — Z954 Presence of other heart-valve replacement: Secondary | ICD-10-CM | POA: Diagnosis not present

## 2016-05-10 DIAGNOSIS — Z9109 Other allergy status, other than to drugs and biological substances: Secondary | ICD-10-CM

## 2016-05-10 DIAGNOSIS — A419 Sepsis, unspecified organism: Secondary | ICD-10-CM | POA: Diagnosis not present

## 2016-05-10 DIAGNOSIS — E872 Acidosis: Secondary | ICD-10-CM | POA: Diagnosis not present

## 2016-05-10 DIAGNOSIS — R000 Tachycardia, unspecified: Secondary | ICD-10-CM | POA: Diagnosis not present

## 2016-05-10 DIAGNOSIS — Z9114 Patient's other noncompliance with medication regimen: Secondary | ICD-10-CM | POA: Diagnosis not present

## 2016-05-10 DIAGNOSIS — I248 Other forms of acute ischemic heart disease: Secondary | ICD-10-CM | POA: Diagnosis present

## 2016-05-10 DIAGNOSIS — I2510 Atherosclerotic heart disease of native coronary artery without angina pectoris: Secondary | ICD-10-CM | POA: Diagnosis present

## 2016-05-10 DIAGNOSIS — Z79899 Other long term (current) drug therapy: Secondary | ICD-10-CM | POA: Diagnosis not present

## 2016-05-10 DIAGNOSIS — Z7901 Long term (current) use of anticoagulants: Secondary | ICD-10-CM

## 2016-05-10 DIAGNOSIS — E861 Hypovolemia: Secondary | ICD-10-CM | POA: Diagnosis present

## 2016-05-10 DIAGNOSIS — D638 Anemia in other chronic diseases classified elsewhere: Secondary | ICD-10-CM | POA: Diagnosis present

## 2016-05-10 DIAGNOSIS — I5042 Chronic combined systolic (congestive) and diastolic (congestive) heart failure: Secondary | ICD-10-CM | POA: Diagnosis not present

## 2016-05-10 DIAGNOSIS — N179 Acute kidney failure, unspecified: Secondary | ICD-10-CM | POA: Diagnosis present

## 2016-05-10 DIAGNOSIS — E876 Hypokalemia: Secondary | ICD-10-CM | POA: Diagnosis present

## 2016-05-10 DIAGNOSIS — G9340 Encephalopathy, unspecified: Secondary | ICD-10-CM | POA: Diagnosis not present

## 2016-05-10 DIAGNOSIS — E875 Hyperkalemia: Secondary | ICD-10-CM | POA: Diagnosis present

## 2016-05-10 DIAGNOSIS — E1010 Type 1 diabetes mellitus with ketoacidosis without coma: Secondary | ICD-10-CM | POA: Diagnosis not present

## 2016-05-10 DIAGNOSIS — N390 Urinary tract infection, site not specified: Secondary | ICD-10-CM

## 2016-05-10 DIAGNOSIS — E1310 Other specified diabetes mellitus with ketoacidosis without coma: Secondary | ICD-10-CM | POA: Diagnosis not present

## 2016-05-10 DIAGNOSIS — Z951 Presence of aortocoronary bypass graft: Secondary | ICD-10-CM

## 2016-05-10 DIAGNOSIS — E1065 Type 1 diabetes mellitus with hyperglycemia: Secondary | ICD-10-CM

## 2016-05-10 DIAGNOSIS — E1110 Type 2 diabetes mellitus with ketoacidosis without coma: Secondary | ICD-10-CM | POA: Diagnosis present

## 2016-05-10 DIAGNOSIS — Z888 Allergy status to other drugs, medicaments and biological substances status: Secondary | ICD-10-CM | POA: Diagnosis not present

## 2016-05-10 DIAGNOSIS — Z955 Presence of coronary angioplasty implant and graft: Secondary | ICD-10-CM | POA: Diagnosis not present

## 2016-05-10 DIAGNOSIS — D649 Anemia, unspecified: Secondary | ICD-10-CM | POA: Diagnosis not present

## 2016-05-10 DIAGNOSIS — Z8249 Family history of ischemic heart disease and other diseases of the circulatory system: Secondary | ICD-10-CM | POA: Diagnosis not present

## 2016-05-10 DIAGNOSIS — E878 Other disorders of electrolyte and fluid balance, not elsewhere classified: Secondary | ICD-10-CM | POA: Diagnosis present

## 2016-05-10 DIAGNOSIS — Z823 Family history of stroke: Secondary | ICD-10-CM | POA: Diagnosis not present

## 2016-05-10 DIAGNOSIS — E108 Type 1 diabetes mellitus with unspecified complications: Secondary | ICD-10-CM | POA: Diagnosis not present

## 2016-05-10 DIAGNOSIS — E7800 Pure hypercholesterolemia, unspecified: Secondary | ICD-10-CM | POA: Diagnosis present

## 2016-05-10 DIAGNOSIS — I214 Non-ST elevation (NSTEMI) myocardial infarction: Secondary | ICD-10-CM | POA: Diagnosis present

## 2016-05-10 DIAGNOSIS — M069 Rheumatoid arthritis, unspecified: Secondary | ICD-10-CM | POA: Diagnosis present

## 2016-05-10 DIAGNOSIS — Z8673 Personal history of transient ischemic attack (TIA), and cerebral infarction without residual deficits: Secondary | ICD-10-CM | POA: Diagnosis not present

## 2016-05-10 DIAGNOSIS — E860 Dehydration: Secondary | ICD-10-CM | POA: Diagnosis present

## 2016-05-10 DIAGNOSIS — I5032 Chronic diastolic (congestive) heart failure: Secondary | ICD-10-CM | POA: Diagnosis present

## 2016-05-10 DIAGNOSIS — G9341 Metabolic encephalopathy: Secondary | ICD-10-CM | POA: Diagnosis not present

## 2016-05-10 DIAGNOSIS — F329 Major depressive disorder, single episode, unspecified: Secondary | ICD-10-CM | POA: Diagnosis present

## 2016-05-10 DIAGNOSIS — K219 Gastro-esophageal reflux disease without esophagitis: Secondary | ICD-10-CM | POA: Diagnosis present

## 2016-05-10 DIAGNOSIS — E1169 Type 2 diabetes mellitus with other specified complication: Secondary | ICD-10-CM | POA: Diagnosis not present

## 2016-05-10 LAB — CBC WITH DIFFERENTIAL/PLATELET
BASOS ABS: 0 10*3/uL (ref 0.0–0.1)
Basophils Relative: 0 %
EOS ABS: 0 10*3/uL (ref 0.0–0.7)
Eosinophils Relative: 0 %
HCT: 29.3 % — ABNORMAL LOW (ref 36.0–46.0)
Hemoglobin: 8.5 g/dL — ABNORMAL LOW (ref 12.0–15.0)
Lymphocytes Relative: 14 %
Lymphs Abs: 2.7 10*3/uL (ref 0.7–4.0)
MCH: 28.4 pg (ref 26.0–34.0)
MCHC: 29 g/dL — ABNORMAL LOW (ref 30.0–36.0)
MCV: 98 fL (ref 78.0–100.0)
MONOS PCT: 7 %
Monocytes Absolute: 1.4 10*3/uL — ABNORMAL HIGH (ref 0.1–1.0)
NEUTROS ABS: 15.2 10*3/uL — AB (ref 1.7–7.7)
NEUTROS PCT: 79 %
PLATELETS: 470 10*3/uL — AB (ref 150–400)
RBC: 2.99 MIL/uL — AB (ref 3.87–5.11)
RDW: 18 % — ABNORMAL HIGH (ref 11.5–15.5)
WBC: 19.3 10*3/uL — AB (ref 4.0–10.5)

## 2016-05-10 LAB — BASIC METABOLIC PANEL
ANION GAP: 18 — AB (ref 5–15)
ANION GAP: 4 — AB (ref 5–15)
BUN: 23 mg/dL — ABNORMAL HIGH (ref 6–20)
BUN: 19 mg/dL (ref 6–20)
BUN: 13 mg/dL (ref 6–20)
CALCIUM: 7.3 mg/dL — AB (ref 8.9–10.3)
CHLORIDE: 93 mmol/L — AB (ref 101–111)
CHLORIDE: 114 mmol/L — AB (ref 101–111)
CO2: 8 mmol/L — AB (ref 22–32)
CO2: 19 mmol/L — ABNORMAL LOW (ref 22–32)
CREATININE: 1.4 mg/dL — AB (ref 0.44–1.00)
Calcium: 9 mg/dL (ref 8.9–10.3)
Calcium: 7.5 mg/dL — ABNORMAL LOW (ref 8.9–10.3)
Chloride: 116 mmol/L — ABNORMAL HIGH (ref 101–111)
Creatinine, Ser: 1.17 mg/dL — ABNORMAL HIGH (ref 0.44–1.00)
Creatinine, Ser: 0.69 mg/dL (ref 0.44–1.00)
GFR calc Af Amer: 60 mL/min — ABNORMAL LOW (ref >60)
GFR calc non Af Amer: 41 mL/min — ABNORMAL LOW (ref >60)
GFR, EST AFRICAN AMERICAN: 48 mL/min — AB (ref >60)
GFR, EST NON AFRICAN AMERICAN: 51 mL/min — AB (ref >60)
GLUCOSE: 452 mg/dL — AB (ref 65–99)
GLUCOSE: 114 mg/dL — AB (ref 65–99)
Glucose, Bld: 1034 mg/dL (ref 65–99)
POTASSIUM: 6.7 mmol/L — AB (ref 3.5–5.1)
POTASSIUM: 3.5 mmol/L (ref 3.5–5.1)
Potassium: 3.3 mmol/L — ABNORMAL LOW (ref 3.5–5.1)
Sodium: 128 mmol/L — ABNORMAL LOW (ref 135–145)
Sodium: 140 mmol/L (ref 135–145)
Sodium: 139 mmol/L (ref 135–145)

## 2016-05-10 LAB — HEPATIC FUNCTION PANEL
ALBUMIN: 3 g/dL — AB (ref 3.5–5.0)
ALK PHOS: 102 U/L (ref 38–126)
ALT: 20 U/L (ref 14–54)
AST: 30 U/L (ref 15–41)
BILIRUBIN TOTAL: 2 mg/dL — AB (ref 0.3–1.2)
Bilirubin, Direct: 0.2 mg/dL (ref 0.1–0.5)
Indirect Bilirubin: 1.8 mg/dL — ABNORMAL HIGH (ref 0.3–0.9)
Total Protein: 6 g/dL — ABNORMAL LOW (ref 6.5–8.1)

## 2016-05-10 LAB — GLUCOSE, CAPILLARY
GLUCOSE-CAPILLARY: 440 mg/dL — AB (ref 65–99)
GLUCOSE-CAPILLARY: 274 mg/dL — AB (ref 65–99)
GLUCOSE-CAPILLARY: 182 mg/dL — AB (ref 65–99)
Glucose-Capillary: >600 mg/dL (ref 65–99)
Glucose-Capillary: 347 mg/dL — ABNORMAL HIGH (ref 65–99)
Glucose-Capillary: 153 mg/dL — ABNORMAL HIGH (ref 65–99)
Glucose-Capillary: 148 mg/dL — ABNORMAL HIGH (ref 65–99)
Glucose-Capillary: 103 mg/dL — ABNORMAL HIGH (ref 65–99)
Glucose-Capillary: 92 mg/dL (ref 65–99)
Glucose-Capillary: 102 mg/dL — ABNORMAL HIGH (ref 65–99)

## 2016-05-10 LAB — MRSA PCR SCREENING: MRSA by PCR: NEGATIVE

## 2016-05-10 LAB — BLOOD GAS, ARTERIAL
ACID-BASE DEFICIT: 27 mmol/L — AB (ref 0.0–2.0)
Bicarbonate: 2.4 mEq/L — ABNORMAL LOW (ref 20.0–24.0)
Drawn by: 331471
O2 Saturation: 95.9 %
PCO2 ART: 8.8 mmHg — AB (ref 35.0–45.0)
PO2 ART: 131 mmHg — AB (ref 80.0–100.0)
Patient temperature: 98.6
TCO2: 2.5 mmol/L (ref 0–100)
pH, Arterial: 7.07 — CL (ref 7.350–7.450)

## 2016-05-10 LAB — URINE MICROSCOPIC-ADD ON
Bacteria, UA: NONE SEEN
RBC / HPF: NONE SEEN RBC/hpf (ref 0–5)
WBC UA: NONE SEEN WBC/hpf (ref 0–5)

## 2016-05-10 LAB — URINALYSIS, ROUTINE W REFLEX MICROSCOPIC
Bilirubin Urine: NEGATIVE
Hgb urine dipstick: NEGATIVE
Leukocytes, UA: NEGATIVE
Nitrite: NEGATIVE
PROTEIN: NEGATIVE mg/dL
SPECIFIC GRAVITY, URINE: 1.023 (ref 1.005–1.030)
pH: 5 (ref 5.0–8.0)

## 2016-05-10 LAB — CBG MONITORING, ED: Glucose-Capillary: >600 mg/dL (ref 65–99)

## 2016-05-10 LAB — PROCALCITONIN: Procalcitonin: 5.13 ng/mL

## 2016-05-10 LAB — LACTIC ACID, PLASMA
LACTIC ACID, VENOUS: 2.4 mmol/L — AB (ref 0.5–2.0)
Lactic Acid, Venous: 4.9 mmol/L (ref 0.5–2.0)
Lactic Acid, Venous: 1.9 mmol/L (ref 0.5–2.0)

## 2016-05-10 LAB — ETHANOL

## 2016-05-10 LAB — PROTIME-INR
INR: 1.43 (ref 0.00–1.49)
Prothrombin Time: 17.5 seconds — ABNORMAL HIGH (ref 11.6–15.2)

## 2016-05-10 LAB — APTT: aPTT: 140 seconds — ABNORMAL HIGH (ref 24–37)

## 2016-05-10 MED ORDER — SODIUM CHLORIDE 0.9 % IV BOLUS (SEPSIS)
2000.0000 mL | Freq: Once | INTRAVENOUS | Status: AC
  Administered 2016-05-10: 2000 mL via INTRAVENOUS

## 2016-05-10 MED ORDER — SODIUM CHLORIDE 0.9 % IV BOLUS (SEPSIS)
1000.0000 mL | Freq: Once | INTRAVENOUS | Status: AC
  Administered 2016-05-10: 1000 mL via INTRAVENOUS

## 2016-05-10 MED ORDER — HEPARIN (PORCINE) IN NACL 100-0.45 UNIT/ML-% IJ SOLN
900.0000 [IU]/h | INTRAMUSCULAR | Status: DC
  Administered 2016-05-10: 1000 [IU]/h via INTRAVENOUS
  Filled 2016-05-10: qty 250

## 2016-05-10 MED ORDER — SODIUM CHLORIDE 0.9 % IV SOLN
INTRAVENOUS | Status: DC
  Administered 2016-05-10: 5.4 [IU]/h via INTRAVENOUS
  Filled 2016-05-10: qty 2.5

## 2016-05-10 MED ORDER — DEXTROSE-NACL 5-0.45 % IV SOLN: INTRAVENOUS | Status: DC

## 2016-05-10 MED ORDER — VANCOMYCIN HCL IN DEXTROSE 1-5 GM/200ML-% IV SOLN
1000.0000 mg | Freq: Once | INTRAVENOUS | Status: AC
Start: 2016-05-10 — End: 2016-05-10
  Administered 2016-05-10: 1000 mg via INTRAVENOUS
  Filled 2016-05-10: qty 200

## 2016-05-10 MED ORDER — ACETAMINOPHEN 650 MG RE SUPP: 650.0000 mg | Freq: Four times a day (QID) | RECTAL | Status: DC | PRN

## 2016-05-10 MED ORDER — PIPERACILLIN-TAZOBACTAM 3.375 G IVPB 30 MIN
3.3750 g | Freq: Once | INTRAVENOUS | Status: AC
  Administered 2016-05-10: 3.375 g via INTRAVENOUS
  Filled 2016-05-10: qty 50

## 2016-05-10 MED ORDER — PIPERACILLIN-TAZOBACTAM 3.375 G IVPB
3.3750 g | Freq: Three times a day (TID) | INTRAVENOUS | Status: DC
  Administered 2016-05-11 – 2016-05-13 (×7): 3.375 g via INTRAVENOUS
  Filled 2016-05-10 (×6): qty 50

## 2016-05-10 MED ORDER — SODIUM CHLORIDE 0.9 % IV SOLN
INTRAVENOUS | Status: DC
  Filled 2016-05-10: qty 2.5

## 2016-05-10 MED ORDER — ACETAMINOPHEN 325 MG PO TABS
650.0000 mg | ORAL_TABLET | Freq: Four times a day (QID) | ORAL | Status: DC | PRN
  Administered 2016-05-13: 650 mg via ORAL
  Filled 2016-05-10: qty 2

## 2016-05-10 MED ORDER — ONDANSETRON HCL 4 MG PO TABS
4.0000 mg | ORAL_TABLET | Freq: Four times a day (QID) | ORAL | Status: DC | PRN
  Administered 2016-05-13: 4 mg via ORAL
  Filled 2016-05-10: qty 1

## 2016-05-10 MED ORDER — POTASSIUM CHLORIDE 10 MEQ/100ML IV SOLN
10.0000 meq | INTRAVENOUS | Status: AC
  Administered 2016-05-10 (×2): 10 meq via INTRAVENOUS
  Filled 2016-05-10 (×2): qty 100

## 2016-05-10 MED ORDER — SODIUM CHLORIDE 0.9 % IV SOLN
INTRAVENOUS | Status: DC
  Administered 2016-05-10: 14:00:00 via INTRAVENOUS
  Administered 2016-05-12: 100 mL/h via INTRAVENOUS

## 2016-05-10 MED ORDER — LORAZEPAM 2 MG/ML IJ SOLN
1.0000 mg | Freq: Once | INTRAMUSCULAR | Status: AC
  Administered 2016-05-10: 2 mg via INTRAVENOUS

## 2016-05-10 MED ORDER — SODIUM CHLORIDE 0.9 % IV BOLUS (SEPSIS)
500.0000 mL | Freq: Once | INTRAVENOUS | Status: AC
  Administered 2016-05-10: 500 mL via INTRAVENOUS

## 2016-05-10 MED ORDER — ONDANSETRON HCL 4 MG/2ML IJ SOLN: 4.0000 mg | Freq: Four times a day (QID) | INTRAMUSCULAR | Status: DC | PRN

## 2016-05-10 MED ORDER — CHLORHEXIDINE GLUCONATE CLOTH 2 % EX PADS
6.0000 | MEDICATED_PAD | Freq: Every day | CUTANEOUS | Status: DC
  Administered 2016-05-11 – 2016-05-13 (×3): 6 via TOPICAL

## 2016-05-10 MED ORDER — VANCOMYCIN HCL 500 MG IV SOLR
500.0000 mg | Freq: Two times a day (BID) | INTRAVENOUS | Status: DC
  Administered 2016-05-11: 500 mg via INTRAVENOUS
  Filled 2016-05-10: qty 500

## 2016-05-10 MED ORDER — DEXTROSE-NACL 5-0.45 % IV SOLN
INTRAVENOUS | Status: DC
  Administered 2016-05-10 – 2016-05-11 (×2): via INTRAVENOUS

## 2016-05-10 MED ORDER — HEPARIN BOLUS VIA INFUSION
1600.0000 [IU] | Freq: Once | INTRAVENOUS | Status: AC
  Administered 2016-05-10: 1600 [IU] via INTRAVENOUS
  Filled 2016-05-10: qty 1600

## 2016-05-10 MED ORDER — LORAZEPAM 2 MG/ML IJ SOLN
INTRAMUSCULAR | Status: AC
  Administered 2016-05-10: 2 mg via INTRAVENOUS
  Filled 2016-05-10: qty 1

## 2016-05-10 MED ORDER — SODIUM CHLORIDE 0.9% FLUSH
3.0000 mL | Freq: Two times a day (BID) | INTRAVENOUS | Status: DC
  Administered 2016-05-10 – 2016-05-12 (×6): 3 mL via INTRAVENOUS

## 2016-05-10 MED ORDER — MUPIROCIN 2 % EX OINT
1.0000 "application " | TOPICAL_OINTMENT | Freq: Two times a day (BID) | CUTANEOUS | Status: DC
  Administered 2016-05-11 – 2016-05-12 (×5): 1 via NASAL
  Filled 2016-05-10 (×2): qty 22

## 2016-05-10 MED ORDER — STERILE WATER FOR INJECTION IV SOLN
Freq: Once | INTRAVENOUS | Status: AC
  Administered 2016-05-10: 10:00:00 via INTRAVENOUS
  Filled 2016-05-10: qty 850

## 2016-05-10 NOTE — Progress Notes (Signed)
Pharmacy Antibiotic Note  Madison [name] is a 55 y.o. female with AMS and DKA transferred to ICU on insulin drip,  admitted on 05/10/2016.  Pharmacy has been consulted for vancomycin and zosyn dosing for possible sepsis (lactate 4.9).  Plan:  Vancomycin 1g IV x 1, then 500mg  IV q12h, goal 15-20 mcg/ml Zosyn 3.375gm IV q8h (4hr extended infusions) Follow up renal function & cultures   Height: 5\' 3"  (160 cm) Weight: 132 lb 7.9 oz (60.1 kg) IBW/kg (Calculated) : 52.4  Temp (24hrs), Avg:97.9 F (36.6 C), Min:97.9 F (36.6 C), Max:97.9 F (36.6 C)   Recent Labs Lab 05/04/16 0332 05/05/16 0450 05/08/16 0803 05/10/16 0752 05/10/16 0918 05/10/16 1335  WBC 4.7  --  4.4  --  19.3*  --   CREATININE 0.68 0.69  --  1.40*  --  1.17*  LATICACIDVEN  --   --   --   --   --  4.9*    Estimated Creatinine Clearance: 44.9 mL/min (by C-G formula based on Cr of 1.17).    Allergies  Allergen Reactions  . Adhesive [Tape] Other (See Comments)    Can burn skin if left on too long  . Celebrex [Celecoxib] Rash  . Detrol [Tolterodine] Hives    Antimicrobials this admission: 6/22 Vanc >> 6/22 Zosyn >>  Dose adjustments this admission: ---  Microbiology results: 6/22 BCx: sent 6/22 UCx: sent  6/22 MRSA PCR: neg  Thank you for allowing pharmacy to be a part of this patient's care.  7/22, PharmD, BCPS Pager: [phone] 05/10/2016 3:48 PM

## 2016-05-10 NOTE — H&P (Signed)
History and Physical    Madison [name]:[id number] DOB: 08-14-1961 DOA: 05/10/2016  PCP: Dorothyann Peng, NP  Patient coming from: Home   Chief Complaint: AMS, elevated blood sugar.    HPI: Madison [name] is a 55 y.o. female with medical history significant of  HTN, CHF diastolic disfunction , mechanical prosthetic valve, frequent admission for DKA, non complaint with medications. Presents with AMS and elevated blood sugar, care giver call EMS . Patient unable to provide history due to AMS. She is able to follow some command. She denies chest pain.   ED Course: Found to be in DKA, lethargic , PH 7.0, co2 ; 8.8 , WBC at 19, CBG more than 1000, UA positive ketones. Was evaluated by CCM who recommend triad to admit   Review of Systems: unable to obtain from patient due to AMS   Past Medical History  Diagnosis Date  . CAD (coronary artery disease)   . Obesity   . Hypercholesteremia   . HTN (hypertension)   . Dyslipidemia   . Aortic stenosis   . Thrombophlebitis   . Heart murmur   . Myocardial infarction ([place]) 12/2014  . Pneumonia 11/2014; 12/2014  . GERD (gastroesophageal reflux disease)   . Stroke syndrome [facility]) 1995; 2001    "when my son was born; problems w/speech and L hand since then" (01/19/2015)  . Anxiety   . Depression   . Allergy   . CHF (congestive heart failure) ([place])   . Rheumatoid arthritis(714.0)     "hands" (07/01/2015)  . Diabetes ([place]) dx'd 1982    "went straight to insulin"    Past Surgical History  Procedure Laterality Date  . Cesarean section  1995  . Foot fracture surgery    . Knee arthroscopy Right   . Neuroplasty / transposition median nerve at carpal tunnel    . Left heart catheterization with coronary angiogram N/A 12/08/2014    Procedure: LEFT HEART CATHETERIZATION WITH CORONARY ANGIOGRAM;  Surgeon: Wellington Hampshire, MD;  Location: [facility] CATH LAB;  Service: Cardiovascular;  Laterality: N/A;  . Percutaneous coronary stent intervention (pci-s) N/A 12/09/2014     Procedure: PERCUTANEOUS CORONARY STENT INTERVENTION (PCI-S);  Surgeon: Leonie Man, MD;  Location: [facility] CATH LAB;  Service: Cardiovascular;  Laterality: N/A;  . Cardiac catheterization  12/08/2014  . Coronary angioplasty with stent placement  12/09/2014  . Aortic valve replacement (avr)/coronary artery bypass grafting (cabg)  "2014"    Archie Endo 03/08/2014  . Fracture surgery    . Tubal ligation  1995  . Cataract extraction Left   . Coronary artery bypass graft    . Orif ankle fracture Left 07/04/2015    Procedure: OPEN REDUCTION INTERNAL FIXATION (ORIF)  TRIMAL ANKLE FRACTURE;  Surgeon: Leandrew Koyanagi, MD;  Location: [facility];  Service: Orthopedics;  Laterality: Left;     reports that she has never smoked. She has never used smokeless tobacco. She reports that she does not drink alcohol or use illicit drugs.  Allergies  Allergen Reactions  . Adhesive [Tape] Other (See Comments)    Can burn skin if left on too long  . Celebrex [Celecoxib] Rash  . Detrol [Tolterodine] Hives    Family History  Problem Relation Age of Onset  . Stroke    . Heart disease Mother   . Heart disease Sister      Prior to Admission medications   Medication Sig Start Date End Date Taking? Authorizing Provider  Blood Glucose Monitoring Suppl (ONE [facility] BASIC  SYSTEM) w/Device KIT Use to check glucose 2x per day 03/16/16  Yes Dorothyann Peng, NP  citalopram (CELEXA) 40 MG tablet Take 1 tablet (40 mg total) by mouth daily. 03/08/16   Dorothyann Peng, NP  glucose blood test strip Use as instructed Patient taking differently: 1 each by Other route as needed (as instructed). Use as instructed 03/16/16   Dorothyann Peng, NP  insulin aspart (NOVOLOG FLEXPEN) 100 UNIT/ML FlexPen Inject 0-9 Units into the skin 3 (three) times daily with meals. CBG < 70: eat or drink something and recheck, CBG 70 - 120: 0 units CBG 121 - 150: 2 unit CBG 151 - 200: 3 units CBG 201 - 250: 5 units CBG 251 - 300:  6units CBG 301 - 350:  7units CBG  351 - 400: 10 units CBG > 400: call MD. 04/30/16   Dorothyann Peng, NP  insulin detemir (LEVEMIR) 100 UNIT/ML injection Inject 0.2 mLs (20 Units total) into the skin daily. 05/08/16   Nishant Dhungel, MD  omeprazole (PRILOSEC) 40 MG capsule TAKE ONE CAPSULE BY MOUTH EVERY DAY 03/20/16   Dorothyann Peng, NP  ondansetron (ZOFRAN) 4 MG tablet Take 4 mg by mouth every 4 (four) hours.    Historical Provider, MD  traZODone (DESYREL) 50 MG tablet Take 50 mg by mouth at bedtime.  12/17/15   Historical Provider, MD  warfarin (COUMADIN) 7.5 MG tablet Take 7.5 mg by mouth See admin instructions.    Historical Provider, MD    Physical Exam: Filed Vitals:   05/10/16 1100 05/10/16 1115 05/10/16 1130 05/10/16 1145  BP: _0 112/46  Pulse: 123 121 125 125  Temp:      TempSrc:      Resp: _1 Height:      Weight:      SpO2: 100% 91% 100% 100%      Constitutional: lethargic, follow some command.  Filed Vitals:   05/10/16 1100 05/10/16 1115 05/10/16 1130 05/10/16 1145  BP: _2 112/46  Pulse: 123 121 125 125  Temp:      TempSrc:      Resp: _3 Height:      Weight:      SpO2: 100% 91% 100% 100%   Eyes: PERRL, lids and conjunctivae normal ENMT: mucosae membrane dry  Neck: normal, supple, no masses, no thyromegaly Respiratory: clear to auscultation bilaterally, no wheezing, no crackles. Hyperventilating ,  No accessory muscle use.  Cardiovascular: Regular rate and rhythm, no murmurs / rubs / gallops. No extremity edema. 2+ pedal pulses. No carotid bruits.  Abdomen: no tenderness, no masses palpated. No hepatosplenomegaly. Bowel sounds positive.  Musculoskeletal: no clubbing / cyanosis. No joint deformity upper and lower extremities. Good ROM, no contractures. Normal muscle tone.  Skin: no rashes, lesions, ulcers. No induration Neurologic: lethargic, left side spasticity chronic, follow some command  Psychiatric: unable to assess due to AMS    Labs on  Admission: I have personally reviewed following labs and imaging studies  CBC:  Recent Labs Lab 05/04/16 0332 05/08/16 0803 05/10/16 0918  WBC 4.7 4.4 19.3*  NEUTROABS  --   --  15.2*  HGB 8.6* 8.8* 8.5*  HCT 27.3* 28.0* 29.3*  MCV 86.9 88.3 98.0  PLT 177 214 599*   Basic Metabolic Panel:  Recent Labs Lab 05/04/16 0332 05/04/16 1330 05/05/16 0450 05/10/16 0752  NA 143  --  141 128*  K 3.7  --  3.9 6.7*  CL 113*  --  111 93*  CO2 26  --  25 <7*  GLUCOSE 32* 58* 85 1034*  BUN <5*  --  <5* 23*  CREATININE 0.68  --  0.69 1.40*  CALCIUM 8.3*  --  8.3* 9.0  MG 2.0  --   --   --   PHOS 2.7  --   --   --    GFR: Estimated Creatinine Clearance: 37.6 mL/min (by C-G formula based on Cr of 1.4). Liver Function Tests:  Recent Labs Lab 05/10/16 0752  AST 30  ALT 20  ALKPHOS 102  BILITOT 2.0*  PROT 6.0*  ALBUMIN 3.0*   No results for input(s): LIPASE, AMYLASE in the last 168 hours. No results for input(s): AMMONIA in the last 168 hours. Coagulation Profile:  Recent Labs Lab 05/04/16 0928 05/05/16 0450 05/06/16 0255 05/07/16 0456 05/08/16 0803  INR 1.12 1.29 1.30 1.58* 1.95*   Cardiac Enzymes: No results for input(s): CKTOTAL, CKMB, CKMBINDEX, TROPONINI in the last 168 hours. BNP (last 3 results) No results for input(s): PROBNP in the last 8760 hours. HbA1C: No results for input(s): HGBA1C in the last 72 hours. CBG:  Recent Labs Lab 05/10/16 0945 05/10/16 1039 05/10/16 1149 05/10/16 1254 05/10/16 1359  GLUCAP >600* >600* >600* >600* 440*   Lipid Profile: No results for input(s): CHOL, HDL, LDLCALC, TRIG, CHOLHDL, LDLDIRECT in the last 72 hours. Thyroid Function Tests: No results for input(s): TSH, T4TOTAL, FREET4, T3FREE, THYROIDAB in the last 72 hours. Anemia Panel: No results for input(s): VITAMINB12, FOLATE, FERRITIN, TIBC, IRON, RETICCTPCT in the last 72 hours. Urine analysis:    Component Value Date/Time   COLORURINE YELLOW 05/10/2016 1002     APPEARANCEUR CLEAR 05/10/2016 1002   LABSPEC 1.023 05/10/2016 1002   PHURINE 5.0 05/10/2016 1002   GLUCOSEU >1000* 05/10/2016 1002   HGBUR NEGATIVE 05/10/2016 1002   BILIRUBINUR NEGATIVE 05/10/2016 1002   BILIRUBINUR n 06/16/2015 1244   KETONESUR >80* 05/10/2016 1002   PROTEINUR NEGATIVE 05/10/2016 1002   PROTEINUR n 06/16/2015 1244   UROBILINOGEN 0.2 07/30/2015 1415   UROBILINOGEN 0.2 06/16/2015 1244   NITRITE NEGATIVE 05/10/2016 1002   NITRITE n 06/16/2015 1244   LEUKOCYTESUR NEGATIVE 05/10/2016 1002   Sepsis Labs: !!!!!!!!!!!!!!!!!!!!!!!!!!!!!!!!!!!!!!!!!!!! _0 (procalcitonin:4,lacticidven:4) ) Recent Results (from the past 240 hour(s))  Urine culture     Status: None   Collection Time: 04/30/16 11:39 PM  Result Value Ref Range Status   Specimen Description URINE, CATHETERIZED  Final   Special Requests NONE  Final   Culture NO GROWTH  Final   Report Status 05/02/2016 FINAL  Final  Blood culture (routine x 2)     Status: None   Collection Time: 05/01/16 12:25 AM  Result Value Ref Range Status   Specimen Description BLOOD RIGHT FEMORAL CVC  Final   Special Requests BOTTLES DRAWN AEROBIC AND ANAEROBIC 5ML  Final   Culture NO GROWTH 5 DAYS  Final   Report Status 05/06/2016 FINAL  Final  Blood culture (routine x 2)     Status: None   Collection Time: 05/01/16 12:40 AM  Result Value Ref Range Status   Specimen Description BLOOD RIGHT FEMORAL CVC  Final   Special Requests IN PEDIATRIC BOTTLE 5ML  Final   Culture NO GROWTH 5 DAYS  Final   Report Status 05/06/2016 FINAL  Final  MRSA PCR Screening     Status: None   Collection Time: 05/01/16  1:23 AM  Result Value Ref Range Status   MRSA by PCR NEGATIVE NEGATIVE Final  Comment:        The GeneXpert MRSA Assay (FDA approved for NASAL specimens only), is one component of a comprehensive MRSA colonization surveillance program. It is not intended to diagnose MRSA infection nor to guide or monitor treatment  for MRSA infections.   Culture, respiratory (NON-Expectorated)     Status: None   Collection Time: 05/01/16  1:00 PM  Result Value Ref Range Status   Specimen Description TRACHEAL ASPIRATE  Final   Special Requests Normal  Final   Gram Stain   Final    FEW WBC PRESENT,BOTH PMN AND MONONUCLEAR NO ORGANISMS SEEN    Culture NO GROWTH 2 DAYS  Final   Report Status 05/03/2016 FINAL  Final  Culture, blood (Routine X 2) w Reflex to ID Panel     Status: None   Collection Time: 05/01/16  6:55 PM  Result Value Ref Range Status   Specimen Description BLOOD RIGHT HAND  Final   Special Requests IN PEDIATRIC BOTTLE 1.5CC  Final   Culture NO GROWTH 5 DAYS  Final   Report Status 05/06/2016 FINAL  Final  Culture, blood (Routine X 2) w Reflex to ID Panel     Status: None   Collection Time: 05/01/16  7:23 PM  Result Value Ref Range Status   Specimen Description BLOOD LEFT ANTECUBITAL  Final   Special Requests IN PEDIATRIC BOTTLE 3CC  Final   Culture NO GROWTH 5 DAYS  Final   Report Status 05/06/2016 FINAL  Final  Culture, Urine     Status: None   Collection Time: 05/01/16 11:24 PM  Result Value Ref Range Status   Specimen Description URINE, CATHETERIZED  Final   Special Requests NONE  Final   Culture NO GROWTH  Final   Report Status 05/03/2016 FINAL  Final  Culture, respiratory (NON-Expectorated)     Status: None   Collection Time: 05/01/16 11:30 PM  Result Value Ref Range Status   Specimen Description TRACHEAL ASPIRATE  Final   Special Requests Normal  Final   Gram Stain   Final    FEW WBC PRESENT,BOTH PMN AND MONONUCLEAR NO ORGANISMS SEEN    Culture Consistent with normal respiratory flora.  Final   Report Status 05/04/2016 FINAL  Final     Radiological Exams on Admission: Dg Chest Port 1 View  05/10/2016  CLINICAL DATA:  Diabetic ketoacidosis. EXAM: PORTABLE CHEST 1 VIEW COMPARISON:  05/02/2016 FINDINGS: Sternotomy wires are unchanged. Interval removal of nasogastric tube and  endotracheal tube. Lungs are adequately inflated and otherwise clear. Cardiomediastinal silhouette and remainder of the exam is unchanged. IMPRESSION: No active disease. Electronically Signed   By: Marin Olp M.D.   On: 05/10/2016 13:39    AYT:KZSW available. Sinus tachycardia.  telemetry   Assessment/Plan Active Problems:   CAD (coronary artery disease)   Rheumatoid arthritis (HCC)   Spastic hemiplegia affecting nondominant side (HCC)   NSTEMI (non-ST elevated myocardial infarction) (HCC)   S/P aortic valve replacement   DKA, type 1 (HCC)   Chronic systolic heart failure (HCC)   AKI (acute kidney injury) ([place])   Type I diabetes mellitus with complication, uncontrolled ([place])  1-DKA,severe;  Presents with  metabolic acidosis, bicarb less than 7, PH 7.0. Has received 4 L of IV fluids. Will order another IV bolus.  On insulin Gtt.  B-met every 4 hours.   2-Acute encephalopathy;  In setting of DKA,acidosis. Follows some command.  Treating underline cause.   3-Mechanical Aortic Valve;  On coumadin.  Check  INR if sub therapeutic will need to start heparin.   3-Leukocytosis; could be related to DKA. Workup for infection ordered. Chest x rayu negative for PNA. UA negative for infection.  Blood culture ordered. Check lactic acid.   4-Hyperkalemia; IV fluids ,IVinsulin. Correcting acidosis. Check EKG. Follow up B-met.  5-AKI; insetting of hypovolemia; IV fluids.     DVT prophylaxis: check INR if sub therapeutic will need to start heparin.  Code Status: full code.  Family Communication: none at bedside  Disposition Plan: to be determine  Consults called: none Admission status: inpatient, step down.    Elmarie Shiley MD [facility] Pager [phone]  If 7PM-7AM, please contact night-coverage [email or web address] Password [email or web address]  05/10/2016, 2:10 PM

## 2016-05-10 NOTE — Progress Notes (Signed)
ANTICOAGULATION CONSULT NOTE - Initial Consult  Pharmacy Consult for heparin Indication:mechanical valve  Allergies  Allergen Reactions  . Adhesive [Tape] Other (See Comments)    Can burn skin if left on too long  . Celebrex [Celecoxib] Rash  . Detrol [Tolterodine] Hives    Patient Measurements: Height: 5\' 3"  (160 cm) Weight: 132 lb 7.9 oz (60.1 kg) IBW/kg (Calculated) : 52.4 Heparin Dosing Weight: 60.1kg  Vital Signs: Temp: 97.9 F (36.6 C) (06/22 0927) Temp Source: Rectal (06/22 0927) BP: 90/38 mmHg (06/22 1500) Pulse Rate: 111 (06/22 1500)  Labs:  Recent Labs  05/08/16 0803 05/10/16 0752 05/10/16 0918 05/10/16 1335  HGB 8.8*  --  8.5*  --   HCT 28.0*  --  29.3*  --   PLT 214  --  470*  --   LABPROT 22.1*  --   --  17.5*  INR 1.95*  --   --  1.43  CREATININE  --  1.40*  --   --     Estimated Creatinine Clearance: 37.6 mL/min (by C-G formula based on Cr of 1.4).   Medical History: Past Medical History  Diagnosis Date  . CAD (coronary artery disease)   . Obesity   . Hypercholesteremia   . HTN (hypertension)   . Dyslipidemia   . Aortic stenosis   . Thrombophlebitis   . Heart murmur   . Myocardial infarction (HCC) 12/2014  . Pneumonia 11/2014; 12/2014  . GERD (gastroesophageal reflux disease)   . Stroke syndrome [facility]) 1995; 2001    "when my son was born; problems w/speech and L hand since then" (01/19/2015)  . Anxiety   . Depression   . Allergy   . CHF (congestive heart failure) (HCC)   . Rheumatoid arthritis(714.0)     "hands" (07/01/2015)  . Diabetes (HCC) dx'd 1982    "went straight to insulin"     Assessment: 55 y.o. female with medical history significant of HTN, CHF diastolic disfunction , mechanical prosthetic valve, frequent admission for DKA, non complaint with medications.  On arrival, patient was confused and unable to give history.  Pharmacy consulted to dose heparin for mechanical valve when INR <2.  INR 1.43 Plts 470 H/H low  Goal  of Therapy:  Heparin level 0.3-0.7 units/ml "Monitor platelets by anticoagulation protocol   Plan:  Heparin bolus 1600 units x 1 Then heparin drip 1000 units/hr Heparin level in 6 hours Daily CBC/heparin level   53 RPh 05/10/2016, 3:25 PM Pager [phone]

## 2016-05-10 NOTE — ED Notes (Signed)
Per [facility] pt from home reports hyperglycemia. Was seen 1 week ago for similar symptoms. CBG registered high on EMS meter. Also reports nausea , denies emesis nor abd pain per EMS. Alert and oriented to normal base.

## 2016-05-10 NOTE — Telephone Encounter (Signed)
Patient admitted to hospital.

## 2016-05-10 NOTE — Progress Notes (Signed)
Patient is unable to articulate what her home meds are. We have added a comment to each med with the last filled dates. Note that her discharge AVS from 05/08/16 did NOT include, Vicodin, pravastatin, or warfarin 3mg . , PharmD, pager [phone]. 05/10/2016,8:42 PM.

## 2016-05-10 NOTE — Progress Notes (Signed)
Inpatient Diabetes Program Recommendations  AACE/ADA: New Consensus Statement on Inpatient Glycemic Control (2015)  Target Ranges:  Prepandial:   less than 140 mg/dL      Peak postprandial:   less than 180 mg/dL (1-2 hours)      Critically ill patients:  140 - 180 mg/dL   Lab Results  Component Value Date   GLUCAP 182* 05/10/2016   HGBA1C 9.9* 01/28/2016   Results for CORLETTE, CIANO (MRN [id number]) as of 05/10/2016 18:21  Ref. Range 05/10/2016 07:52 05/10/2016 13:35  Sodium Latest Ref Range: 135-145 mmol/L 128 (L) 140  Potassium Latest Ref Range: 3.5-5.1 mmol/L 6.7 (HH) 3.5  Chloride Latest Ref Range: 101-111 mmol/L 93 (L) 114 (H)  CO2 Latest Ref Range: 22-32 mmol/L <7 (L) 8 (L)  BUN Latest Ref Range: 6-20 mg/dL 23 (H) 19  Creatinine Latest Ref Range: 0.44-1.00 mg/dL 1.40 (H) 1.17 (H)  Calcium Latest Ref Range: 8.9-10.3 mg/dL 9.0 7.5 (L)  EGFR (Non-African Amer.) Latest Ref Range: >60 mL/min 41 (L) 51 (L)  EGFR (African American) Latest Ref Range: >60 mL/min 48 (L) 60 (L)  Glucose Latest Ref Range: 65-99 mg/dL 1034 (HH) 452 (H)  Anion gap Latest Ref Range: 5-15  NOT CALCULATED 18 (H)   Review of Glycemic Control  Diabetes history: DM1 Outpatient Diabetes medications: Levemir 20 units QD, Novolog 0-9 units tidwc Current orders for Inpatient glycemic control: GlucoStabilizer per DKA order set  Inpatient Diabetes Program Recommendations:    Continue IV insulin until acidosis is cleared and criteria met for transitioning to SQ insulin. Need updated HgbA1C.  Will follow closely. Thank you. Lorenda Peck, RD, LDN, CDE Inpatient Diabetes Coordinator [phone]

## 2016-05-10 NOTE — Progress Notes (Signed)
CRITICAL VALUE ALERT  Critical value received:  Lactic Acid 4.9  Date of notification:  05/10/16  Time of notification:  15:28  Critical value read back:Yes.    Nurse who received alert:  Heloise Purpura  MD notified (1st page):  [place]  Time of first page:  15:29  MD notified (2nd page):  Time of second page:  Responding MD:  Sunnie Nielsen  Time MD responded:  15:37

## 2016-05-10 NOTE — Progress Notes (Addendum)
ED CM received information from [facility] staff, Sheral Flow that pt is No Longer active for home services- RN, PT, OT, Nurse's Aide Pt with [facility] 9 ED visits and 2 admissions in the last 6 months  Last admission on 04/30/16 to 05/08/16 at [facility] for Active Problems: Severe sepsis with acute organ dysfunction [facility]) Active issues DKA (diabetic ketoacidoses) (HCC Pt is active with [facility] services with last call on 05/09/16  Pt returns to ED with Hyperglycemia >600  Scheduled to see pcp 6/12/27/15

## 2016-05-10 NOTE — Patient Outreach (Signed)
Telephone call to patient for transition of care. patient was discharged from the acute care setting on June 19.  Patient was agreeable to telephone assessment. This RNCM stated the purpose of the call. Patient states she does not want to go to the hospital anymore  This RNCM advised patient that she would need to follow the prescribed medication regimen to prevent DKA which she has been admitted with several times over the past few months. Patient reports having her medications with the exception of Levemeir. Patient states her aide will be bringing her medication this afternoon.  This RNCM advised patient of a local program, [facility], which provides transportation to and from the program, medical and nursing care as well as nutrition and socialization.    This RNCM requested information on date of follow up appointment with primary care physician.   Patient advised this RNCM her mother is coming this weekend, she was not going to make any appointments until her mother gets here.  Patient advised that an appointment was already made with her primary care provider, Hillery Hunter, NP.    Patient stated she is dong fine and does not need any further assistance at this time. Patient did consent to a home visit next week for assessment of case management needs.

## 2016-05-10 NOTE — ED Notes (Signed)
Bed: WA17 Expected date:  Expected time:  Means of arrival:  Comments: EMS - F hyperglycemia

## 2016-05-10 NOTE — Patient Outreach (Signed)
[facility] Customer service manager ([place]) Care Management  05/10/2016  Madison [name] [birth year] [id number]   CSW continues to try and make contact with patient to perform phone assessment, as well as assess and assist with social work needs and services, without success.  After thorough review of patient's FR (Electronic Medical Record), CSW noted that patient presented to the Emergency Department at [facility] on June 2nd, due to Hyperglycemia.  Patient was transported via EMS ([facility]), who reported patient hyperventilating and dehydrated.  CSW is aware that patient is no longer active with Home Health Nurse, Donia Pounds with [facility].  Nor is patient receiving home health physical therapy, occupational therapy and aide services.   Patient has had 9 emergency department visits and 2 hospital admissions within the last six months.  Patient would greatly benefit from long-term care placement into an assisted living facility, as patient is not capable of caring for herself independently in the home.  Patient is non-compliant with prescription medications and diabetes management.  Patient's most recent hospitalization occurred from June 12 th through June 20 th.  Patient is experiencing severe Sepsis with Acute Organ Dysfunction, as well as Diabetic Ketoacidoses. CSW will continue to follow along to assess and assist with possible social work needs and services. Danford Bad, BSW, MSW, LCSW  Licensed Restaurant manager, [employer] [facility]  [place] [address], [place], [place] [address] Physical Address-[address], [place], [place] [address] Toll Free Main # [phone] Fax # [phone] Cell # [phone]  Fax # [phone]  [name]

## 2016-05-10 NOTE — Progress Notes (Signed)
CRITICAL VALUE ALERT  Critical value received:  Lactic Acid   Date of notification:  05/10/16 Time of notification:  1904  Critical value read back yes   Nurse who received alert:  Heloise Purpura RN   MD notified (1st page):  [place]   Time of first page:  1946  MD notified (2nd page):  Time of second page:  Responding MD:    Time MD responded:

## 2016-05-10 NOTE — ED Provider Notes (Signed)
CSN: [id number]     Arrival date & time 05/10/16  2458 History   First MD Initiated Contact with Patient 05/10/16 0740     Chief Complaint  Patient presents with  . Hyperglycemia     (Consider location/radiation/quality/duration/timing/severity/associated sxs/prior Treatment) HPI   Madison [name] is a 55 y.o. female who presents by EMS for evaluation. It is unclear who called EMS. On arrival, patient was confused and unable to give history. I attempted to talk to the patient, she was sitting on the edge of the bed and did not answer questions directly. She was hyperventilating, and appeared dehydrated.   Level V caveat- altered mental status   Past Medical History  Diagnosis Date  . CAD (coronary artery disease)   . Obesity   . Hypercholesteremia   . HTN (hypertension)   . Dyslipidemia   . Aortic stenosis   . Thrombophlebitis   . Heart murmur   . Myocardial infarction ([place]) 12/2014  . Pneumonia 11/2014; 12/2014  . GERD (gastroesophageal reflux disease)   . Stroke syndrome [facility]) 1995; 2001    "when my son was born; problems w/speech and L hand since then" (01/19/2015)  . Anxiety   . Depression   . Allergy   . CHF (congestive heart failure) ([place])   . Rheumatoid arthritis(714.0)     "hands" (07/01/2015)  . Diabetes ([place]) dx'd 1982    "went straight to insulin"   Past Surgical History  Procedure Laterality Date  . Cesarean section  1995  . Foot fracture surgery    . Knee arthroscopy Right   . Neuroplasty / transposition median nerve at carpal tunnel    . Left heart catheterization with coronary angiogram N/A 12/08/2014    Procedure: LEFT HEART CATHETERIZATION WITH CORONARY ANGIOGRAM;  Surgeon: Wellington Hampshire, MD;  Location: [facility] CATH LAB;  Service: Cardiovascular;  Laterality: N/A;  . Percutaneous coronary stent intervention (pci-s) N/A 12/09/2014    Procedure: PERCUTANEOUS CORONARY STENT INTERVENTION (PCI-S);  Surgeon: Leonie Man, MD;  Location: [facility] CATH LAB;  Service:  Cardiovascular;  Laterality: N/A;  . Cardiac catheterization  12/08/2014  . Coronary angioplasty with stent placement  12/09/2014  . Aortic valve replacement (avr)/coronary artery bypass grafting (cabg)  "2014"    Archie Endo 03/08/2014  . Fracture surgery    . Tubal ligation  1995  . Cataract extraction Left   . Coronary artery bypass graft    . Orif ankle fracture Left 07/04/2015    Procedure: OPEN REDUCTION INTERNAL FIXATION (ORIF)  TRIMAL ANKLE FRACTURE;  Surgeon: Leandrew Koyanagi, MD;  Location: [place];  Service: Orthopedics;  Laterality: Left;   Family History  Problem Relation Age of Onset  . Stroke    . Heart disease Mother   . Heart disease Sister    Social History  Substance Use Topics  . Smoking status: Never Smoker   . Smokeless tobacco: Never Used  . Alcohol Use: No   OB History    No data available     Review of Systems  Unable to perform ROS: Mental status change      Allergies  Adhesive; Celebrex; and Detrol  Home Medications   Prior to Admission medications   Medication Sig Start Date End Date Taking? Authorizing Provider  Blood Glucose Monitoring Suppl ([place]) w/Device KIT Use to check glucose 2x per day 03/16/16  Yes Dorothyann Peng, NP  citalopram (CELEXA) 40 MG tablet Take 1 tablet (40 mg total) by mouth daily. 03/08/16  Dorothyann Peng, NP  glucose blood test strip Use as instructed Patient taking differently: 1 each by Other route as needed (as instructed). Use as instructed 03/16/16   Dorothyann Peng, NP  insulin aspart (NOVOLOG FLEXPEN) 100 UNIT/ML FlexPen Inject 0-9 Units into the skin 3 (three) times daily with meals. CBG < 70: eat or drink something and recheck, CBG 70 - 120: 0 units CBG 121 - 150: 2 unit CBG 151 - 200: 3 units CBG 201 - 250: 5 units CBG 251 - 300:  6units CBG 301 - 350:  7units CBG 351 - 400: 10 units CBG > 400: call MD. 04/30/16   Dorothyann Peng, NP  insulin detemir (LEVEMIR) 100 UNIT/ML injection Inject 0.2 mLs (20 Units  total) into the skin daily. 05/08/16   Nishant Dhungel, MD  omeprazole (PRILOSEC) 40 MG capsule TAKE ONE CAPSULE BY MOUTH EVERY DAY 03/20/16   Dorothyann Peng, NP  ondansetron (ZOFRAN) 4 MG tablet Take 4 mg by mouth every 4 (four) hours.    Historical Provider, MD  traZODone (DESYREL) 50 MG tablet Take 50 mg by mouth at bedtime.  12/17/15   Historical Provider, MD  warfarin (COUMADIN) 7.5 MG tablet Take 7.5 mg by mouth See admin instructions.    Historical Provider, MD   BP 90/51 mmHg  Pulse 122  Temp(Src) 97.9 F (36.6 C) (Rectal)  Resp 28  Ht _0  (1.6 m)  Wt 128 lb (58.06 kg)  BMI 22.68 kg/m2  SpO2 100% Physical Exam  Constitutional: She appears well-developed.  Ill-appearing, appears older than stated age.  HENT:  Head: Normocephalic and atraumatic.  Right Ear: External ear normal.  Left Ear: External ear normal.  Dry mucous membranes.  Eyes: Conjunctivae and EOM are normal. Pupils are equal, round, and reactive to light.  Neck: Normal range of motion and phonation normal. Neck supple.  Cardiovascular: Regular rhythm and normal heart sounds.   Tachycardic. Mild systolic hypertension.  Pulmonary/Chest: No respiratory distress. She exhibits no bony tenderness.  Tachypneic without wheezing. No increased work of breathing.  Abdominal: Soft. She exhibits no distension. There is no tenderness.  Musculoskeletal: Normal range of motion.  Neurological: She is alert. No cranial nerve deficit or sensory deficit. She exhibits normal muscle tone. Coordination normal. GCS eye subscore is 4. GCS verbal subscore is 1. GCS motor subscore is 5.  No verbal responsiveness, but responds to painful stimuli appropriately.  Skin: Skin is warm, dry and intact.  Psychiatric:  Obtunded  Nursing note and vitals reviewed.   ED Course  .Central Line Date/Time: 05/10/2016 9:20 AM Performed by: Daleen Bo Authorized by: Daleen Bo Consent: The procedure was performed in an emergent situation.  Verbal consent not obtained. Written consent not obtained. Required items: required blood products, implants, devices, and special equipment available Patient identity confirmed: hospital-assigned identification number and arm band Time out: Immediately prior to procedure a "time out" was called to verify the correct patient, procedure, equipment, support staff and site/side marked as required. Indications: vascular access Anesthesia: local infiltration Local anesthetic: lidocaine 1% without epinephrine Anesthetic total: 2 ml Patient sedated: yes Sedation type: anxiolysis Sedatives: lorazepam Vitals: Vital signs were monitored during sedation. Preparation: skin prepped with 2% chlorhexidine Skin prep agent dried: skin prep agent completely dried prior to procedure Sterile barriers: all five maximum sterile barriers used - cap, mask, sterile gown, sterile gloves, and large sterile sheet Location details: left femoral Catheter type: triple lumen Catheter size: 7 Fr Pre-procedure: landmarks identified Ultrasound guidance: yes Sterile ultrasound  techniques: sterile gel and sterile probe covers were used Number of attempts: 3 Successful placement: yes Post-procedure: line sutured and dressing applied Assessment: blood return through all ports Patient tolerance: Patient tolerated the procedure well with no immediate complications   (including critical care time)  Initial clinical impression- recurrent DKA, with recent similar episode, presenting tachypneic and tachycardic. Very poor vascular access. Initial trauma peripheral access, failed. Attempt at right EJ, by me, failed 2. Urgent central line access, left groin approach. Resuscitative fluids begun. Bicarbonate for acidosis.   Medications  dextrose 5 %-0.45 % sodium chloride infusion ( Intravenous Hold 05/10/16 0921)  insulin regular (NOVOLIN R,HUMULIN R) 250 Units in sodium chloride 0.9 % 250 mL (1 Units/mL) infusion (10.8 Units/hr  Intravenous Rate/Dose Change 05/10/16 1047)  sodium chloride 0.9 % bolus 2,000 mL (0 mLs Intravenous Stopped 05/10/16 1048)  LORazepam (ATIVAN) injection 1 mg (2 mg Intravenous Given 05/10/16 0920)  sodium bicarbonate 150 mEq in sterile water 1,000 mL infusion ( Intravenous New Bag/Given 05/10/16 0953)  sodium chloride 0.9 % bolus 1,000 mL (1,000 mLs Intravenous New Bag/Given 05/10/16 1049)    Patient Vitals for the past 24 hrs:  BP Temp Temp src Pulse Resp SpO2 Height Weight  05/10/16 1045 (!) 90/51 mmHg - - (!) 122 (!) 28 100 % - -  05/10/16 1030 (!) 95/50 mmHg - - 119 (!) 28 100 % - -  05/10/16 1015 (!) 93/48 mmHg - - 115 24 100 % - -  05/10/16 1000 (!) 81/70 mmHg - - 115 22 99 % - -  05/10/16 0945 (!) 94/46 mmHg - - 111 21 100 % - -  05/10/16 0941 - - - - - - _0  (1.6 m) 128 lb (58.06 kg)  05/10/16 0935 (!) 105/44 mmHg - - 120 22 100 % - -  05/10/16 0927 - 97.9 F (36.6 C) Rectal - - - - -  05/10/16 0900 94/58 mmHg - - - (!) 27 - - -  05/10/16 0845 101/86 mmHg - - - (!) 30 - - -  05/10/16 0830 121/56 mmHg - - - (!) 31 - - -  05/10/16 0743 (!) 93/51 mmHg - - 114 24 - - -  05/10/16 0727 - - - - - 97 % - -    Resuscitative treatment- IV fluids,  Intraosseous line placement, by me- sterile technique using chlorhexidine scrub, and right proximal tibial approach. Bone marrow blood, spontaneously recovered from needle, IV fluids, placed under pressure.  Right interosseous line, proximal tibial- very slow infusion, it was removed after placement of the left femoral line  9:25 AM-Consult complete with Dr. Lake Bells. Patient case explained and discussed. He agrees to admit patient for further evaluation and treatment. Call ended at 0935  09:55 AM Reevaluation with update and discussion. After initial assessment and treatment, an updated evaluation reveals clinical examination is unchanged. Critical care here evaluating the patient. [name] L   10:15- she's been evaluated by critical  care, who feel that she is stable for admission, by a primary care service. They believe that she will require 5-6 L of IV fluid resuscitation, to help stabilize her in addition to using insulin, IV, to treat the hyperglycemia.  11:01 AM-Consult complete with Hospitalist. Patient case explained and discussed. She agrees to admit patient for further evaluation and treatment. Call ended at [place] Performed by: Richarda Blade Total critical care time: 100 minutes Critical care time was exclusive of separately billable procedures and treating other patients.  Critical care was necessary to treat or prevent imminent or life-threatening deterioration. Critical care was time spent personally by me on the following activities: development of treatment plan with patient and/or surrogate as well as nursing, discussions with consultants, evaluation of patient's response to treatment, examination of patient, obtaining history from patient or surrogate, ordering and performing treatments and interventions, ordering and review of laboratory studies, ordering and review of radiographic studies, pulse oximetry and re-evaluation of patient's condition.    Labs Review Labs Reviewed  BLOOD GAS, ARTERIAL - Abnormal; Notable for the following:    pH, Arterial 7.070 (*)    pCO2 arterial 8.8 (*)    pO2, Arterial 131 (*)    Bicarbonate 2.4 (*)    Acid-base deficit 27.0 (*)    All other components within normal limits  BASIC METABOLIC PANEL - Abnormal; Notable for the following:    Sodium 128 (*)    Potassium 6.7 (*)    Chloride 93 (*)    CO2 <7 (*)    Glucose, Bld 1034 (*)    BUN 23 (*)    Creatinine, Ser 1.40 (*)    GFR calc non Af Amer 41 (*)    GFR calc Af Amer 48 (*)    All other components within normal limits  HEPATIC FUNCTION PANEL - Abnormal; Notable for the following:    Total Protein 6.0 (*)    Albumin 3.0 (*)    Total Bilirubin 2.0 (*)    Indirect Bilirubin 1.8 (*)    All other  components within normal limits  URINALYSIS, ROUTINE W REFLEX MICROSCOPIC (NOT AT [facility]) - Abnormal; Notable for the following:    Glucose, UA >1000 (*)    Ketones, ur >80 (*)    All other components within normal limits  CBC WITH DIFFERENTIAL/PLATELET - Abnormal; Notable for the following:    WBC 19.3 (*)    RBC 2.99 (*)    Hemoglobin 8.5 (*)    HCT 29.3 (*)    MCHC 29.0 (*)    RDW 18.0 (*)    Platelets 470 (*)    Neutro Abs 15.2 (*)    Monocytes Absolute 1.4 (*)    All other components within normal limits  URINE MICROSCOPIC-ADD ON - Abnormal; Notable for the following:    Squamous Epithelial / LPF 0-5 (*)    All other components within normal limits  CBG MONITORING, ED - Abnormal; Notable for the following:    Glucose-Capillary >600 (*)    All other components within normal limits  CBG MONITORING, ED - Abnormal; Notable for the following:    Glucose-Capillary >600 (*)    All other components within normal limits  CBG MONITORING, ED - Abnormal; Notable for the following:    Glucose-Capillary >600 (*)    All other components within normal limits  URINE CULTURE  ETHANOL  CBC WITH DIFFERENTIAL/PLATELET    Imaging Review No results found. I have personally reviewed and evaluated these images and lab results as part of my medical decision-making.   EKG Interpretation None      MDM   Final diagnoses:  Diabetic ketoacidosis without coma associated with type 1 diabetes mellitus ([place])  Noncompliance with medication regimen    Recurrent DKA related to noncompliance with treatment. Moderate dehydration. Patient has refused efforts to place her in the past. She will require hospitalization, with close monitoring in the stepdown unit, while resuscitation occurs.  Nursing Notes Reviewed/ Care Coordinated, and agree without changes. Applicable Imaging Reviewed.  Interpretation of Laboratory Data incorporated into ED treatment  Plan: Admit    Daleen Bo, MD 05/10/16  1118

## 2016-05-11 DIAGNOSIS — E1010 Type 1 diabetes mellitus with ketoacidosis without coma: Secondary | ICD-10-CM

## 2016-05-11 DIAGNOSIS — G9340 Encephalopathy, unspecified: Secondary | ICD-10-CM

## 2016-05-11 DIAGNOSIS — A419 Sepsis, unspecified organism: Principal | ICD-10-CM | POA: Insufficient documentation

## 2016-05-11 DIAGNOSIS — E1310 Other specified diabetes mellitus with ketoacidosis without coma: Secondary | ICD-10-CM

## 2016-05-11 DIAGNOSIS — E872 Acidosis: Secondary | ICD-10-CM

## 2016-05-11 DIAGNOSIS — N390 Urinary tract infection, site not specified: Secondary | ICD-10-CM

## 2016-05-11 LAB — CBC WITH DIFFERENTIAL/PLATELET
Basophils Absolute: 0 10*3/uL (ref 0.0–0.1)
Basophils Relative: 0 %
EOS PCT: 0 %
Eosinophils Absolute: 0 10*3/uL (ref 0.0–0.7)
HEMATOCRIT: 25.3 % — AB (ref 36.0–46.0)
Hemoglobin: 8.1 g/dL — ABNORMAL LOW (ref 12.0–15.0)
LYMPHS ABS: 2.7 10*3/uL (ref 0.7–4.0)
LYMPHS PCT: 18 %
MCH: 27.8 pg (ref 26.0–34.0)
MCHC: 32 g/dL (ref 30.0–36.0)
MCV: 86.9 fL (ref 78.0–100.0)
MONO ABS: 0.6 10*3/uL (ref 0.1–1.0)
Monocytes Relative: 4 %
NEUTROS ABS: 11.6 10*3/uL — AB (ref 1.7–7.7)
Neutrophils Relative %: 78 %
PLATELETS: 323 10*3/uL (ref 150–400)
RBC: 2.91 MIL/uL — AB (ref 3.87–5.11)
RDW: 16.5 % — ABNORMAL HIGH (ref 11.5–15.5)
WBC: 14.9 10*3/uL — AB (ref 4.0–10.5)

## 2016-05-11 LAB — BASIC METABOLIC PANEL
ANION GAP: 7 (ref 5–15)
ANION GAP: 5 (ref 5–15)
Anion gap: 4 — ABNORMAL LOW (ref 5–15)
Anion gap: 8 (ref 5–15)
Anion gap: 3 — ABNORMAL LOW (ref 5–15)
BUN: 12 mg/dL (ref 6–20)
BUN: 10 mg/dL (ref 6–20)
BUN: 9 mg/dL (ref 6–20)
BUN: 6 mg/dL (ref 6–20)
BUN: <5 mg/dL — ABNORMAL LOW (ref 6–20)
CALCIUM: 6.8 mg/dL — AB (ref 8.9–10.3)
CALCIUM: 7 mg/dL — AB (ref 8.9–10.3)
CALCIUM: 6.9 mg/dL — AB (ref 8.9–10.3)
CALCIUM: 7.2 mg/dL — AB (ref 8.9–10.3)
CHLORIDE: 117 mmol/L — AB (ref 101–111)
CO2: 13 mmol/L — AB (ref 22–32)
CO2: 12 mmol/L — ABNORMAL LOW (ref 22–32)
CO2: 14 mmol/L — ABNORMAL LOW (ref 22–32)
CO2: 18 mmol/L — ABNORMAL LOW (ref 22–32)
CO2: 20 mmol/L — ABNORMAL LOW (ref 22–32)
CREATININE: 0.55 mg/dL (ref 0.44–1.00)
CREATININE: 0.58 mg/dL (ref 0.44–1.00)
CREATININE: 0.67 mg/dL (ref 0.44–1.00)
Calcium: 6.8 mg/dL — ABNORMAL LOW (ref 8.9–10.3)
Chloride: 121 mmol/L — ABNORMAL HIGH (ref 101–111)
Chloride: 114 mmol/L — ABNORMAL HIGH (ref 101–111)
Chloride: 115 mmol/L — ABNORMAL HIGH (ref 101–111)
Chloride: 111 mmol/L (ref 101–111)
Creatinine, Ser: 0.55 mg/dL (ref 0.44–1.00)
Creatinine, Ser: 0.54 mg/dL (ref 0.44–1.00)
GFR calc Af Amer: >60 mL/min (ref >60)
GFR calc Af Amer: >60 mL/min (ref >60)
GFR calc Af Amer: >60 mL/min (ref >60)
GFR calc Af Amer: >60 mL/min (ref >60)
GFR calc non Af Amer: >60 mL/min (ref >60)
GFR calc non Af Amer: >60 mL/min (ref >60)
GLUCOSE: 108 mg/dL — AB (ref 65–99)
GLUCOSE: 244 mg/dL — AB (ref 65–99)
GLUCOSE: 157 mg/dL — AB (ref 65–99)
GLUCOSE: 118 mg/dL — AB (ref 65–99)
GLUCOSE: 120 mg/dL — AB (ref 65–99)
POTASSIUM: 3.2 mmol/L — AB (ref 3.5–5.1)
Potassium: 5.1 mmol/L (ref 3.5–5.1)
Potassium: 3.6 mmol/L (ref 3.5–5.1)
Potassium: 3.3 mmol/L — ABNORMAL LOW (ref 3.5–5.1)
Potassium: 2.9 mmol/L — ABNORMAL LOW (ref 3.5–5.1)
SODIUM: 137 mmol/L (ref 135–145)
SODIUM: 136 mmol/L (ref 135–145)
Sodium: 138 mmol/L (ref 135–145)
Sodium: 135 mmol/L (ref 135–145)
Sodium: 136 mmol/L (ref 135–145)

## 2016-05-11 LAB — HEPARIN LEVEL (UNFRACTIONATED)
Heparin Unfractionated: 0.71 IU/mL — ABNORMAL HIGH (ref 0.30–0.70)
Heparin Unfractionated: 0.79 IU/mL — ABNORMAL HIGH (ref 0.30–0.70)
Heparin Unfractionated: 0.71 IU/mL — ABNORMAL HIGH (ref 0.30–0.70)
Heparin Unfractionated: 0.32 IU/mL (ref 0.30–0.70)

## 2016-05-11 LAB — GLUCOSE, CAPILLARY
GLUCOSE-CAPILLARY: 129 mg/dL — AB (ref 65–99)
GLUCOSE-CAPILLARY: 213 mg/dL — AB (ref 65–99)
GLUCOSE-CAPILLARY: 161 mg/dL — AB (ref 65–99)
GLUCOSE-CAPILLARY: 139 mg/dL — AB (ref 65–99)
GLUCOSE-CAPILLARY: 136 mg/dL — AB (ref 65–99)
GLUCOSE-CAPILLARY: 93 mg/dL (ref 65–99)
GLUCOSE-CAPILLARY: 104 mg/dL — AB (ref 65–99)
Glucose-Capillary: 104 mg/dL — ABNORMAL HIGH (ref 65–99)
Glucose-Capillary: 109 mg/dL — ABNORMAL HIGH (ref 65–99)
Glucose-Capillary: 170 mg/dL — ABNORMAL HIGH (ref 65–99)
Glucose-Capillary: 216 mg/dL — ABNORMAL HIGH (ref 65–99)
Glucose-Capillary: 220 mg/dL — ABNORMAL HIGH (ref 65–99)

## 2016-05-11 LAB — LACTIC ACID, PLASMA
Lactic Acid, Venous: 3.3 mmol/L (ref 0.5–2.0)
Lactic Acid, Venous: 0.9 mmol/L (ref 0.5–2.0)

## 2016-05-11 LAB — TROPONIN I
Troponin I: 0.42 ng/mL — ABNORMAL HIGH (ref <0.031)
Troponin I: 0.33 ng/mL — ABNORMAL HIGH (ref <0.031)
Troponin I: 0.25 ng/mL — ABNORMAL HIGH (ref <0.031)

## 2016-05-11 LAB — URINE CULTURE: Special Requests: NORMAL

## 2016-05-11 LAB — AMMONIA: Ammonia: <9 umol/L — ABNORMAL LOW (ref 9–35)

## 2016-05-11 MED ORDER — DEXTROSE 5 % IV SOLN: INTRAVENOUS | Status: DC

## 2016-05-11 MED ORDER — PHENYLEPHRINE HCL 10 MG/ML IJ SOLN
0.0000 ug/min | INTRAVENOUS | Status: DC
  Administered 2016-05-11: 30 ug/min via INTRAVENOUS
  Administered 2016-05-11: 5 ug/min via INTRAVENOUS
  Administered 2016-05-11: 20 ug/min via INTRAVENOUS
  Filled 2016-05-11 (×3): qty 1

## 2016-05-11 MED ORDER — HEPARIN (PORCINE) IN NACL 100-0.45 UNIT/ML-% IJ SOLN
750.0000 [IU]/h | INTRAMUSCULAR | Status: DC
  Filled 2016-05-11: qty 250

## 2016-05-11 MED ORDER — SODIUM ACETATE 2 MEQ/ML IV SOLN
INTRAVENOUS | Status: DC
  Administered 2016-05-11 (×2): via INTRAVENOUS
  Filled 2016-05-11 (×5): qty 1000

## 2016-05-11 MED ORDER — VANCOMYCIN HCL IN DEXTROSE 750-5 MG/150ML-% IV SOLN
750.0000 mg | Freq: Two times a day (BID) | INTRAVENOUS | Status: DC
  Administered 2016-05-11 – 2016-05-13 (×4): 750 mg via INTRAVENOUS
  Filled 2016-05-11 (×5): qty 150

## 2016-05-11 MED ORDER — HEPARIN (PORCINE) IN NACL 100-0.45 UNIT/ML-% IJ SOLN
650.0000 [IU]/h | INTRAMUSCULAR | Status: DC
  Administered 2016-05-11: 600 [IU]/h via INTRAVENOUS
  Filled 2016-05-11 (×2): qty 250

## 2016-05-11 MED ORDER — INSULIN DETEMIR 100 UNIT/ML ~~LOC~~ SOLN
10.0000 [IU] | Freq: Two times a day (BID) | SUBCUTANEOUS | Status: DC
  Administered 2016-05-11 – 2016-05-12 (×4): 10 [IU] via SUBCUTANEOUS
  Filled 2016-05-11 (×5): qty 0.1

## 2016-05-11 MED ORDER — INSULIN ASPART 100 UNIT/ML ~~LOC~~ SOLN
0.0000 [IU] | SUBCUTANEOUS | Status: DC
  Administered 2016-05-11 – 2016-05-13 (×5): 2 [IU] via SUBCUTANEOUS
  Administered 2016-05-13: 4 [IU] via SUBCUTANEOUS
  Administered 2016-05-14: 2 [IU] via SUBCUTANEOUS

## 2016-05-11 MED ORDER — PHENYLEPHRINE HCL 10 MG/ML IJ SOLN: 0.0000 ug/min | INTRAVENOUS | Status: DC

## 2016-05-11 MED ORDER — POTASSIUM CHLORIDE 10 MEQ/100ML IV SOLN
10.0000 meq | INTRAVENOUS | Status: AC
  Administered 2016-05-11 (×4): 10 meq via INTRAVENOUS
  Filled 2016-05-11 (×4): qty 100

## 2016-05-11 NOTE — Progress Notes (Signed)
Pharmacy Antibiotic Note  Madison [name] is a 55 y.o. female with AMS and DKA transferred to ICU on insulin drip,  admitted on 05/10/2016.  Pharmacy has been consulted for vancomycin and zosyn dosing for possible sepsis (lactate 4.9).  Plan:  Vancomycin 1g IV x 1, then 750mg  IV q12h, goal 15-20 mcg/ml Zosyn 3.375gm IV q8h (4hr extended infusions) Follow up renal function & cultures   Height: 5\' 3"  (160 cm) Weight: 132 lb 7.9 oz (60.1 kg) IBW/kg (Calculated) : 52.4  Temp (24hrs), Avg:98.2 F (36.8 C), Min:97.6 F (36.4 C), Max:98.6 F (37 C)   Recent Labs Lab 05/08/16 0803  05/10/16 0918 05/10/16 1335 05/10/16 1824 05/10/16 1958 05/11/16 0111 05/11/16 0432 05/11/16 0540 05/11/16 0829 05/11/16 0830  WBC 4.4  --  19.3*  --   --   --   --   --  14.9*  --   --   CREATININE  --   < >  --  1.17*  --  0.69 0.55 0.58  --   --  0.67  LATICACIDVEN  --   --   --  4.9* 2.4* 1.9  --   --   --  3.3*  --   < > = values in this interval not displayed.  Estimated Creatinine Clearance: 65.7 mL/min (by C-G formula based on Cr of 0.67).    Allergies  Allergen Reactions  . Adhesive [Tape] Other (See Comments)    Can burn skin if left on too long  . Celebrex [Celecoxib] Rash  . Detrol [Tolterodine] Hives   Antimicrobials this admission: 6/22 Vanc >> 6/22 Zosyn >>  Dose adjustments this admission: Empiric increase Vancomycin from 500mg  q12 to 750mg  q12 for improved renal fx  Microbiology results: 6/13 Urine, Blood, Sputum Cx all neg previous admit 6/22 BCx: sent 6/22 UCx: sent  6/22 MRSA PCR: neg  Thank you for allowing pharmacy to be a part of this patient's care.  7/13 PharmD Pager [phone] 05/11/2016, 9:48 AM

## 2016-05-11 NOTE — Consult Note (Signed)
   [facility] Inpatient Consult   05/11/2016  ROYANN WILDASIN 08-26-1961 [id number]   Patient is active with [facility] Care Management services. She has had multiple hospital admissions. She continues to come in hospital with DKA. Ms. Zumstein has issues with non-compliance with taking her insulin. Spoke with [facility] liaison who indicates patient has difficulty in administering her insulin and taking her own blood sugar at home. She has refused ALF and placement from home prior to admission per [facility]. Ms. Pritt lives alone and has a aide that comes in 2 hours a day. Patient could benefit from psychiatric consult and/or palliative medicine consult. Text page sent to Dr. Dartha Lodge to request. Also spoke with inpatient RNCM to discuss that [facility] Care Management has been following patient. Her case continues to be discussed in difficult case discussions. It has also been mentioned to patient that a PACE referral will be made for her by [facility] RN CM. Will continue to follow.   Raiford Noble, MSN-Ed, RN,BSN [facility] Liaison [phone]

## 2016-05-11 NOTE — Progress Notes (Signed)
ANTICOAGULATION CONSULT NOTE - Follow Up Consult  Pharmacy Consult for Heparin Indication: Mechanical Valve  Allergies  Allergen Reactions  . Adhesive [Tape] Other (See Comments)    Can burn skin if left on too long  . Celebrex [Celecoxib] Rash  . Detrol [Tolterodine] Hives    Patient Measurements: Height: 5\' 3"  (160 cm) Weight: 132 lb 7.9 oz (60.1 kg) IBW/kg (Calculated) : 52.4 Heparin Dosing Weight:   Vital Signs: Temp: 97.6 F (36.4 C) (06/22 1653) Temp Source: Oral (06/22 1653) BP: 90/37 mmHg (06/23 0148) Pulse Rate: 93 (06/23 0148)  Labs:  Recent Labs  05/08/16 0803  05/10/16 0918 05/10/16 1335 05/10/16 1958 05/11/16 0106 05/11/16 0111  HGB 8.8*  --  8.5*  --   --   --   --   HCT 28.0*  --  29.3*  --   --   --   --   PLT 214  --  470*  --   --   --   --   APTT  --   --   --   --  140*  --   --   LABPROT 22.1*  --   --  17.5*  --   --   --   INR 1.95*  --   --  1.43  --   --   --   HEPARINUNFRC  --   --   --   --   --  0.71*  --   CREATININE  --   < >  --  1.17* 0.69  --  0.55  < > = values in this interval not displayed.  Estimated Creatinine Clearance: 65.7 mL/min (by C-G formula based on Cr of 0.55).   Medications:  Infusions:  . sodium chloride 125 mL/hr at 05/10/16 1347  . dextrose 5 % and 0.45% NaCl 100 mL/hr at 05/10/16 1737  . heparin 1,000 Units/hr (05/11/16 0109)  . insulin (NOVOLIN-R) infusion Stopped (05/11/16 0109)    Assessment: Patient with high heparin level.  No heparin issues per RN.  Goal of Therapy:  Heparin level 0.3-0.7 units/ml Monitor platelets by anticoagulation protocol: Yes   Plan:  Decrease heparin to 900 units/hr Recheck level at 0900  05/13/16, Darlina Guys Crowford 05/11/2016,2:23 AM

## 2016-05-11 NOTE — Progress Notes (Signed)
Initial Nutrition Assessment  DOCUMENTATION CODES:   Not applicable  INTERVENTION:  - Diet advancement as medically feasible. - RD will monitor for needs with diet advancement.  NUTRITION DIAGNOSIS:   Inadequate oral intake related to inability to eat as evidenced by NPO status.  GOAL:   Patient will meet greater than or equal to 90% of their needs  MONITOR:   Diet advancement, Weight trends, Labs, I & O's  REASON FOR ASSESSMENT:   Malnutrition Screening Tool  ASSESSMENT:   55 y.o. female with medical history significant of HTN, CHF diastolic disfunction , mechanical prosthetic valve, frequent admission for DKA, non complaint with medications. Presents with AMS and elevated blood sugar, care giver call EMS . Patient unable to provide history due to AMS. She is able to follow some command. She denies chest pain.   Pt seen for MST. BMI indicates normal weight. Pt has been NPO since admission and unable to meet needs. Pt sleeping but arousable at time of visit. Pt unable to provide any PTA information. She denies abdominal pain or nausea at this time. She is unable to recall if she had these symptoms PTA or when she last had something to eat or drink. No family/visitors present to provide information. DM Coordinator following pt. Noted pt noncompliance with medications and many admissions (10) for similar presentations in the past 6 months.  Physical assessment shows no muscle or fat wasting. Per chart review, weight up 4 lbs in the past 3 days. Weight from 05/07/16 (58 kg) used to calculate nutrition needs. Overall, weight with many fluctuations over the past 2 months; may be partly related to medical noncompliance, noncompliance with medications.  Medications reviewed; sliding scale Novolog, 10 units Levemir BID, 3.375 g IV Zosyn every 8 hours, 4 runs KCl. IVF: NS @ 125 mL/hr.  Drips: Heparin @ 750 units/hr, Neo @ 30 mcg/min, insulin drip previously running.  Diet Order:  Diet  NPO time specified  Skin:  Reviewed, no issues  Last BM:  6/20  Height:   Ht Readings from Last 1 Encounters:  05/10/16 5\' 3"  (1.6 m)    Weight:   Wt Readings from Last 1 Encounters:  05/10/16 132 lb 7.9 oz (60.1 kg)    Ideal Body Weight:  52.27 kg (kg)  BMI:  Body mass index is 23.48 kg/(m^2).  Estimated Nutritional Needs:   Kcal:  1450-1650  Protein:  55-65 grams  Fluid:  >/= 1.6 L/day  EDUCATION NEEDS:   No education needs identified at this time     05/12/16, MS, RD, LDN Inpatient Clinical Dietitian Pager # [phone] After hours/weekend pager # [phone]

## 2016-05-11 NOTE — Progress Notes (Signed)
ANTICOAGULATION CONSULT NOTE - Initial Consult  Pharmacy Consult for heparin Indication: mechanical valve  Allergies  Allergen Reactions  . Adhesive [Tape] Other (See Comments)    Can burn skin if left on too long  . Celebrex [Celecoxib] Rash  . Detrol [Tolterodine] Hives   Patient Measurements: Height: 5\' 3"  (160 cm) Weight: 132 lb 7.9 oz (60.1 kg) IBW/kg (Calculated) : 52.4 Heparin Dosing Weight: 60.1kg  Vital Signs: Temp: 98.5 F (36.9 C) (06/23 0800) Temp Source: Axillary (06/23 0800) BP: 102/38 mmHg (06/23 0600) Pulse Rate: 87 (06/23 0600)  Labs:  Recent Labs  05/10/16 0918 05/10/16 1335 05/10/16 1958 05/11/16 0106 05/11/16 0111 05/11/16 0432 05/11/16 0540 05/11/16 0830  HGB 8.5*  --   --   --   --   --  8.1*  --   HCT 29.3*  --   --   --   --   --  25.3*  --   PLT 470*  --   --   --   --   --  323  --   APTT  --   --  140*  --   --   --   --   --   LABPROT  --  17.5*  --   --   --   --   --   --   INR  --  1.43  --   --   --   --   --   --   HEPARINUNFRC  --   --   --  0.71*  --   --   --  0.79*  CREATININE  --  1.17* 0.69  --  0.55 0.58  --  0.67  TROPONINI  --   --   --   --   --   --  0.42*  --    Estimated Creatinine Clearance: 65.7 mL/min (by C-G formula based on Cr of 0.67).  Medical History: Past Medical History  Diagnosis Date  . CAD (coronary artery disease)   . Obesity   . Hypercholesteremia   . HTN (hypertension)   . Dyslipidemia   . Aortic stenosis   . Thrombophlebitis   . Heart murmur   . Myocardial infarction ([facility]) 12/2014  . Pneumonia 11/2014; 12/2014  . GERD (gastroesophageal reflux disease)   . 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 Anxiety   . Depression   . Allergy   . CHF (congestive heart failure) ([facility])   . Rheumatoid arthritis(714.0)     "hands" (07/01/2015)  . Diabetes ([facility]) dx'd 1982    "went straight to insulin"   Assessment: 55 y.o. 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  On arrival, patient was confused and unable to give history.  Pharmacy consulted to dose heparin for mechanical valve when INR <2.  Warfarin dose 7.5mg  daily per anti-coag visit 6/12 with INR 3.4 - Goal INR 2.5-3.5  Last Warfarin dose unknown, admit INR 1.43  Today, 05/11/2016   1st Heparin level 0.71 after 1600 unit bolus-initial infusion 1000 units/hr, reduced infusion to 900 units/hr  2nd Heparin level 0.79, still above goal range  Goal of Therapy:  Heparin level 0.3-0.7 units/ml "Monitor platelets by anticoagulation protocol   Plan:   Reduce Heparin to 750 units/hr  Recheck  Hep level in 6 hr  Daily CBC while on Heparin  Daily Hep level when at steady state  Otho Bellows PharmD Pager [phone] 05/11/2016, 9:32 AM

## 2016-05-11 NOTE — Progress Notes (Signed)
ANTICOAGULATION CONSULT NOTE - Follow Up  Pharmacy Consult for heparin Indication: mechanical valve  Allergies  Allergen Reactions  . Adhesive [Tape] Other (See Comments)    Can burn skin if left on too long  . Celebrex [Celecoxib] Rash  . Detrol [Tolterodine] Hives   Patient Measurements: Height: 5\' 3"  (160 cm) Weight: 132 lb 7.9 oz (60.1 kg) IBW/kg (Calculated) : 52.4 Heparin Dosing Weight: 60.1kg  Vital Signs: Temp: 98.7 F (37.1 C) (06/23 1600) Temp Source: Oral (06/23 1600) BP: 122/57 mmHg (06/23 1400) Pulse Rate: 74 (06/23 1400)  Labs:  Recent Labs  05/10/16 0918 05/10/16 1335 05/10/16 1958 05/11/16 0106  05/11/16 0432 05/11/16 0540 05/11/16 0830 05/11/16 1348 05/11/16 1524  HGB 8.5*  --   --   --   --   --  8.1*  --   --   --   HCT 29.3*  --   --   --   --   --  25.3*  --   --   --   PLT 470*  --   --   --   --   --  323  --   --   --   APTT  --   --  140*  --   --   --   --   --   --   --   LABPROT  --  17.5*  --   --   --   --   --   --   --   --   INR  --  1.43  --   --   --   --   --   --   --   --   HEPARINUNFRC  --   --   --  0.71*  --   --   --  0.79*  --  0.71*  CREATININE  --  1.17* 0.69  --   < > 0.58  --  0.67 0.55  --   TROPONINI  --   --   --   --   --   --  0.42*  --  0.33*  --   < > = values in this interval not displayed. Estimated Creatinine Clearance: 65.7 mL/min (by C-G formula based on Cr of 0.55).  Medical History: Past Medical History  Diagnosis Date  . CAD (coronary artery disease)   . Obesity   . Hypercholesteremia   . HTN (hypertension)   . Dyslipidemia   . Aortic stenosis   . Thrombophlebitis   . Heart murmur   . Myocardial infarction (HCC) 12/2014  . Pneumonia 11/2014; 12/2014  . GERD (gastroesophageal reflux disease)   . Stroke syndrome [facility]) 1995; 2001    "when my son was born; problems w/speech and L hand since then" (01/19/2015)  . Anxiety   . Depression   . Allergy   . CHF (congestive heart failure) (HCC)   .  Rheumatoid arthritis(714.0)     "hands" (07/01/2015)  . Diabetes (HCC) dx'd 1982    "went straight to insulin"   Assessment: 55 y.o. 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  On arrival, patient was confused and unable to give history.  Pharmacy consulted to dose heparin for mechanical valve when INR <2.  Warfarin dose 7.5mg  daily per anti-coag visit 6/12 with INR 3.4 - Goal INR 2.5-3.5  Last Warfarin  dose unknown, admit INR 1.43  Today, 05/11/2016   Heparin level continues to be supratherapeutic despite continued reduction in IV heparin rates  No reported bleeding   Goal of Therapy:  Heparin level 0.3-0.7 units/ml "Monitor platelets by anticoagulation protocol   Plan:   Reduce Heparin to 600 units/hr  Recheck Hep level in 6 hr  Daily CBC while on Heparin  Daily Hep level when at steady state   Hessie Knows, PharmD, BCPS Pager [phone] 05/11/2016 4:21 PM

## 2016-05-11 NOTE — Progress Notes (Signed)
PROGRESS NOTE    Madison [name]  UKG:[id number] DOB: 09/10/1961 DOA: 05/10/2016 PCP: [name], NP  Outpatient Specialists:   Brief Narrative: 55 y.o. female with medical history significant of HTN, CHF diastolic disfunction , mechanical prosthetic valve, frequent admission for DKA, non complaint with medications. Presents with AMS and elevated blood sugar, care giver call EMS . Patient unable to provide history due to AMS. She is able to follow some command. She denies chest pain.   Assessment & Plan:   Active Problems:   CAD (coronary artery disease)   Rheumatoid arthritis (HCC)   Spastic hemiplegia affecting nondominant side (HCC)   NSTEMI (non-ST elevated myocardial infarction) (HCC)   S/P aortic valve replacement   DKA, type 1 (HCC)   Chronic systolic heart failure (HCC)   AKI (acute kidney injury) (HCC)   Type I diabetes mellitus with complication, uncontrolled (HCC)  Assessment/Plan 1-DKA,severe: Anion gap has closed. Will start transition protocol.  2-Acute encephalopathy: Likely metabolic versus combined metabolic and toxic. Follow cultures. Will start bicarb drip. ABG done earlier reviewed. Will check ionized calcium considering severe acidosis. Albumin is noted to be 3.   3-Mechanical Aortic Valve: Sub-optimal INR. On Heparin.  3-Leukocytosis: Possibly reactive versus infective process. Follow cultures. Monitor WBC. Continue antibiotics for now.    4-Hyperkalemia: likely secondary to DKA. Resolved with resolution of hyperglycemia. Potassium is 3.6 today. Will monitor and correct electrolytes closely.   5. AKI, likely pre renal, resolved.   6.  Metabolic acidosis - Start bicarb drip. Monitor bicarb level.  Guarded prgonosis   DVT prophylaxis: On heparin drip (Bridge until INR is optimized).  Code Status: full code.  Family Communication: none at bedside  Disposition Plan: to be determine  Consults called: none Admission status: inpatient, step down.    Subjective: Minimal history from patient, but answers direct questions. No fever or chills. No nausea or vomiting. No chest pain or vomiting.  Objective: Filed Vitals:   05/11/16 0541 05/11/16 0545 05/11/16 0554 05/11/16 0600  BP: 78/44  101/43 102/38  Pulse:  94  87  Temp:      TempSrc:      Resp:  14  15  Height:      Weight:      SpO2:  100%  100%    Intake/Output Summary (Last 24 hours) at 05/11/16 0827 Last data filed at 05/11/16 0620  Gross per 24 hour  Intake 2706.65 ml  Output    345 ml  Net 2361.65 ml   Filed Weights   05/10/16 0941 05/10/16 1200  Weight: 58.06 kg (128 lb) 60.1 kg (132 lb 7.9 oz)    Examination:  General exam: Sleepy but easily arousable. Calm and comfortable  Respiratory system: Clear to auscultation. Respiratory effort normal. Cardiovascular system: S1 & S2 heard, added sounds. Gastrointestinal system: Abdomen is nondistended, soft and nontender.  Central nervous system: Sleepy. Moves all limbs. Extremities: No leg edema. Data Reviewed: I have personally reviewed following labs and imaging studies  CBC:  Recent Labs Lab 05/08/16 0803 05/10/16 0918 05/11/16 0540  WBC 4.4 19.3* 14.9*  NEUTROABS  --  15.2* 11.6*  HGB 8.8* 8.5* 8.1*  HCT 28.0* 29.3* 25.3*  MCV 88.3 98.0 86.9  PLT 214 470* 323   Basic Metabolic Panel:  Recent Labs Lab 05/10/16 0752 05/10/16 1335 05/10/16 1958 05/11/16 0111 05/11/16 0432  NA 128* 140 139 138 137  K 6.7* 3.5 3.3* 5.1 3.6  CL 93* 114* 116* 121* 117*  CO2 <7*  8* 19* 13* 12*  GLUCOSE 1034* 452* 114* 108* 244*  BUN 23* 19 13 12 10   CREATININE 1.40* 1.17* 0.69 0.55 0.58  CALCIUM 9.0 7.5* 7.3* 6.8* 6.8*   GFR: Estimated Creatinine Clearance: 65.7 mL/min (by C-G formula based on Cr of 0.58). Liver Function Tests:  Recent Labs Lab 05/10/16 0752  AST 30  ALT 20  ALKPHOS 102  BILITOT 2.0*  PROT 6.0*  ALBUMIN 3.0*   No results for input(s): LIPASE, AMYLASE in the last 168 hours. No  results for input(s): AMMONIA in the last 168 hours. Coagulation Profile:  Recent Labs Lab 05/05/16 0450 05/06/16 0255 05/07/16 0456 05/08/16 0803 05/10/16 1335  INR 1.29 1.30 1.58* 1.95* 1.43   Cardiac Enzymes:  Recent Labs Lab 05/11/16 0540  TROPONINI 0.42*   BNP (last 3 results) No results for input(s): PROBNP in the last 8760 hours. HbA1C: No results for input(s): HGBA1C in the last 72 hours. CBG:  Recent Labs Lab 05/11/16 0215 05/11/16 0340 05/11/16 0441 05/11/16 0554 05/11/16 0644  GLUCAP 129* 213* 216* 220* 170*   Lipid Profile: No results for input(s): CHOL, HDL, LDLCALC, TRIG, CHOLHDL, LDLDIRECT in the last 72 hours. Thyroid Function Tests: No results for input(s): TSH, T4TOTAL, FREET4, T3FREE, THYROIDAB in the last 72 hours. Anemia Panel: No results for input(s): VITAMINB12, FOLATE, FERRITIN, TIBC, IRON, RETICCTPCT in the last 72 hours. Urine analysis:    Component Value Date/Time   COLORURINE YELLOW 05/10/2016 1002   APPEARANCEUR CLEAR 05/10/2016 1002   LABSPEC 1.023 05/10/2016 1002   PHURINE 5.0 05/10/2016 1002   GLUCOSEU >1000* 05/10/2016 1002   HGBUR NEGATIVE 05/10/2016 1002   BILIRUBINUR NEGATIVE 05/10/2016 1002   BILIRUBINUR n 06/16/2015 1244   KETONESUR >80* 05/10/2016 1002   PROTEINUR NEGATIVE 05/10/2016 1002   PROTEINUR n 06/16/2015 1244   UROBILINOGEN 0.2 07/30/2015 1415   UROBILINOGEN 0.2 06/16/2015 1244   NITRITE NEGATIVE 05/10/2016 1002   NITRITE n 06/16/2015 1244   LEUKOCYTESUR NEGATIVE 05/10/2016 1002   Sepsis Labs: @LABRCNTIP (procalcitonin:4,lacticidven:4)  ) Recent Results (from the past 240 hour(s))  Culture, respiratory (NON-Expectorated)     Status: None   Collection Time: 05/01/16  1:00 PM  Result Value Ref Range Status   Specimen Description TRACHEAL ASPIRATE  Final   Special Requests Normal  Final   Gram Stain   Final    FEW WBC PRESENT,BOTH PMN AND MONONUCLEAR NO ORGANISMS SEEN    Culture NO GROWTH 2 DAYS   Final   Report Status 05/03/2016 FINAL  Final  Culture, blood (Routine X 2) w Reflex to ID Panel     Status: None   Collection Time: 05/01/16  6:55 PM  Result Value Ref Range Status   Specimen Description BLOOD RIGHT HAND  Final   Special Requests IN PEDIATRIC BOTTLE 1.5CC  Final   Culture NO GROWTH 5 DAYS  Final   Report Status 05/06/2016 FINAL  Final  Culture, blood (Routine X 2) w Reflex to ID Panel     Status: None   Collection Time: 05/01/16  7:23 PM  Result Value Ref Range Status   Specimen Description BLOOD LEFT ANTECUBITAL  Final   Special Requests IN PEDIATRIC BOTTLE 3CC  Final   Culture NO GROWTH 5 DAYS  Final   Report Status 05/06/2016 FINAL  Final  Culture, Urine     Status: None   Collection Time: 05/01/16 11:24 PM  Result Value Ref Range Status   Specimen Description URINE, CATHETERIZED  Final   Special Requests  NONE  Final   Culture NO GROWTH  Final   Report Status 05/03/2016 FINAL  Final  Culture, respiratory (NON-Expectorated)     Status: None   Collection Time: 05/01/16 11:30 PM  Result Value Ref Range Status   Specimen Description TRACHEAL ASPIRATE  Final   Special Requests Normal  Final   Gram Stain   Final    FEW WBC PRESENT,BOTH PMN AND MONONUCLEAR NO ORGANISMS SEEN    Culture Consistent with normal respiratory flora.  Final   Report Status 05/04/2016 FINAL  Final  MRSA PCR Screening     Status: None   Collection Time: 05/10/16 12:20 PM  Result Value Ref Range Status   MRSA by PCR NEGATIVE NEGATIVE Final    Comment:        The GeneXpert MRSA Assay (FDA approved for NASAL specimens only), is one component of a comprehensive MRSA colonization surveillance program. It is not intended to diagnose MRSA infection nor to guide or monitor treatment for MRSA infections.          Radiology Studies: Dg Chest Port 1 View  05/10/2016  CLINICAL DATA:  Diabetic ketoacidosis. EXAM: PORTABLE CHEST 1 VIEW COMPARISON:  05/02/2016 FINDINGS: Sternotomy wires  are unchanged. Interval removal of nasogastric tube and endotracheal tube. Lungs are adequately inflated and otherwise clear. Cardiomediastinal silhouette and remainder of the exam is unchanged. IMPRESSION: No active disease. Electronically Signed   By: Elberta Fortis M.D.   On: 05/10/2016 13:39        Scheduled Meds: . Chlorhexidine Gluconate Cloth  6 each Topical Q0600  . mupirocin ointment  1 application Nasal BID  . piperacillin-tazobactam (ZOSYN)  IV  3.375 g Intravenous Q8H  . sodium chloride flush  3 mL Intravenous Q12H  . vancomycin  500 mg Intravenous Q12H   Continuous Infusions: . sodium chloride Stopped (05/10/16 2136)  . dextrose    . dextrose 5 % and 0.45% NaCl 100 mL/hr at 05/11/16 0545  . heparin 900 Units/hr (05/11/16 0600)  . insulin (NOVOLIN-R) infusion 3 Units/hr (05/11/16 0815)  . phenylephrine (NEO-SYNEPHRINE) Adult infusion 40 mcg/min (05/11/16 0620)     LOS: 1 day    Time spent: 45 Minutes    Berton Mount, MD  [facility] Pager #: [phone] 7PM-7AM contact night coverage as above

## 2016-05-11 NOTE — Consult Note (Signed)
PULMONARY / CRITICAL CARE MEDICINE   Name: Madison [name] MRN: [id number] DOB: 03/19/1961    ADMISSION DATE:  05/10/2016 CONSULTATION DATE:  6/23  REFERRING MD:  Tyrell Antonio   CHIEF COMPLAINT:  Hypotension   HISTORY OF PRESENT ILLNESS:   This is a 55 year old white female who was just discharged from the hospital on 6/20 after being hospitalized for DKA, +/- sepsis. Called EMS on 6/22 for hyperglycemia. By the time she arrived and was evaluated by EDP was confused and tachypnic. femoral line was placed by EDP, she was administered several liters of crystalloid and admitted to the intensive care w/ working dx of DKA, encephalopathy and hypotension. She had initially been volume responsive but over night and into the am on 6/23 got placed on neo gtt so PCCM asked to eval   PAST MEDICAL HISTORY :  She  has a past medical history of CAD (coronary artery disease); Obesity; Hypercholesteremia; HTN (hypertension); Dyslipidemia; Aortic stenosis; Thrombophlebitis; Heart murmur; Myocardial infarction ([place]) (12/2014); Pneumonia (11/2014; 12/2014); GERD (gastroesophageal reflux disease); Stroke syndrome ([place]) (1995; 2001); Anxiety; Depression; Allergy; CHF (congestive heart failure) ([place]); Rheumatoid arthritis(714.0); and Diabetes ([place]) (dx'd 1982).  PAST SURGICAL HISTORY: She  has past surgical history that includes Cesarean section (1995); Foot fracture surgery; Knee arthroscopy (Right); Neuroplasty / transposition median nerve at carpal tunnel; left heart catheterization with coronary angiogram (N/A, 12/08/2014); percutaneous coronary stent intervention (pci-s) (N/A, 12/09/2014); Cardiac catheterization (12/08/2014); Coronary angioplasty with stent (12/09/2014); Aortic valve replacement (avr)/coronary artery bypass grafting (cabg) ("2014"); Fracture surgery; Tubal ligation (1995); Cataract extraction (Left); Coronary artery bypass graft; and ORIF ankle fracture (Left, 07/04/2015).  Allergies  Allergen Reactions  .  Adhesive [Tape] Other (See Comments)    Can burn skin if left on too long  . Celebrex [Celecoxib] Rash  . Detrol [Tolterodine] Hives    No current facility-administered medications on file prior to encounter.   Current Outpatient Prescriptions on File Prior to Encounter  Medication Sig  . Blood Glucose Monitoring Suppl ([place]) w/Device KIT Use to check glucose 2x per day  . citalopram (CELEXA) 40 MG tablet Take 1 tablet (40 mg total) by mouth daily.  Marland Kitchen glucose blood test strip Use as instructed (Patient taking differently: 1 each by Other route as needed (as instructed). Use as instructed)  . insulin aspart (NOVOLOG FLEXPEN) 100 UNIT/ML FlexPen Inject 0-9 Units into the skin 3 (three) times daily with meals. CBG < 70: eat or drink something and recheck, CBG 70 - 120: 0 units CBG 121 - 150: 2 unit CBG 151 - 200: 3 units CBG 201 - 250: 5 units CBG 251 - 300:  6units CBG 301 - 350:  7units CBG 351 - 400: 10 units CBG > 400: call MD.  . insulin detemir (LEVEMIR) 100 UNIT/ML injection Inject 0.2 mLs (20 Units total) into the skin daily.  Marland Kitchen omeprazole (PRILOSEC) 40 MG capsule TAKE ONE CAPSULE BY MOUTH EVERY DAY  . ondansetron (ZOFRAN) 4 MG tablet Take 4 mg by mouth every 4 (four) hours.  . traZODone (DESYREL) 50 MG tablet Take 50 mg by mouth at bedtime.   Marland Kitchen warfarin (COUMADIN) 7.5 MG tablet Take 7.5 mg by mouth See admin instructions.    FAMILY HISTORY:  Her indicated that her mother is alive. She indicated that her father is deceased. She indicated that all of her five sisters are alive. She indicated that both of her brothers are alive.   SOCIAL HISTORY: She  reports that  she has never smoked. She has never used smokeless tobacco. She reports that she does not drink alcohol or use illicit drugs.  REVIEW OF SYSTEMS:   Unable d/t encephalopathy   SUBJECTIVE:  Feeling better  VITAL SIGNS: BP 102/38 mmHg  Pulse 87  Temp(Src) 98.5 F (36.9 C) (Axillary)  Resp 15   Ht '5\' 3"'  (1.6 m)  Wt 132 lb 7.9 oz (60.1 kg)  BMI 23.48 kg/m2  SpO2 100%  HEMODYNAMICS:    VENTILATOR SETTINGS:    INTAKE / OUTPUT: I/O last 3 completed shifts: In: 2706.7 [P.O.:660; I.V.:1796.7; IV Piggyback:250] Out: 345 [Urine:345]  PHYSICAL EXAMINATION: General:  Chronically ill appearing white female, resting in bed. No distress  Neuro:  Opens eyes and verbally responsive after repeated verbal stimuli. She is oriented x2, her speech is slurred. She has no focal motor defs HEENT:  NCAT, no JVD her MM are moist  Cardiovascular:  RRR, no murmur + click  Lungs:  Clear, no accessory use.  Abdomen:  Soft, not tender + bowel sounds no OM  Musculoskeletal:  Equal st and bulk Skin:  Warm and dry   LABS:  BMET  Recent Labs Lab 05/11/16 0111 05/11/16 0432 05/11/16 0830  NA 138 137 135  K 5.1 3.6 3.3*  CL 121* 117* 114*  CO2 13* 12* 14*  BUN '12 10 9  ' CREATININE 0.55 0.58 0.67  GLUCOSE 108* 244* 157*    Electrolytes  Recent Labs Lab 05/11/16 0111 05/11/16 0432 05/11/16 0830  CALCIUM 6.8* 6.8* 7.0*    CBC  Recent Labs Lab 05/08/16 0803 05/10/16 0918 05/11/16 0540  WBC 4.4 19.3* 14.9*  HGB 8.8* 8.5* 8.1*  HCT 28.0* 29.3* 25.3*  PLT 214 470* 323    Coag's  Recent Labs Lab 05/07/16 0456 05/08/16 0803 05/10/16 1335 05/10/16 1958  APTT  --   --   --  140*  INR 1.58* 1.95* 1.43  --     Sepsis Markers  Recent Labs Lab 05/10/16 1824 05/10/16 1958 05/11/16 0829  LATICACIDVEN 2.4* 1.9 3.3*  PROCALCITON  --  5.13  --     ABG  Recent Labs Lab 05/10/16 0810  PHART 7.070*  PCO2ART 8.8*  PO2ART 131*    Liver Enzymes  Recent Labs Lab 05/10/16 0752  AST 30  ALT 20  ALKPHOS 102  BILITOT 2.0*  ALBUMIN 3.0*    Cardiac Enzymes  Recent Labs Lab 05/11/16 0540  TROPONINI 0.42*    Glucose  Recent Labs Lab 05/11/16 0340 05/11/16 0441 05/11/16 0554 05/11/16 0644 05/11/16 0806 05/11/16 0918  GLUCAP 213* 216* 220* 170* 161*  139*    Imaging Dg Chest Port 1 View  05/10/2016  CLINICAL DATA:  Diabetic ketoacidosis. EXAM: PORTABLE CHEST 1 VIEW COMPARISON:  05/02/2016 FINDINGS: Sternotomy wires are unchanged. Interval removal of nasogastric tube and endotracheal tube. Lungs are adequately inflated and otherwise clear. Cardiomediastinal silhouette and remainder of the exam is unchanged. IMPRESSION: No active disease. Electronically Signed   By: Marin Olp M.D.   On: 05/10/2016 13:39     STUDIES:    CULTURES: UC 6/22: insig growth BCX2 6/22>>>  ANTIBIOTICS: vanc 6/22>>> Zosyn 6/22>>>  SIGNIFICANT EVENTS: 6/22 admitted w/ DKA and dehydration   LINES/TUBES: 6/22 left femoral CVL>>>  DISCUSSION: 55 year old female w/ DKA and medical non-compliance. Just d/c'd for DKA 6/20 to home. Re-admitted w/ same on 6/22. Gap now closed, glucose better controlled, MS improving. She has some leukocytosis but suspect that this is reactive. Her  SBP is in 90s. From our stand-point would continue current measures as outlined by the medical team: cont sodium acetate for NG acidosis, cont insulin, cont hydration and hold sedating meds. From an infection stand-point would f/u pending cultures. Do not think she is infected so would have very low threshold to stop antibiotics. From a BP stand-point would make SBP of 90 or end-point for hemodynamic goals.   ASSESSMENT / PLAN:  PULMONARY A: No acute   P:   Trend pulse ox.  O2 as needed PCXR as needed.   CARDIOVASCULAR A:  Hypotension -->suspect that this was still hypovolemia. Seems to be resolving SIRS-->doubt sepsis H/o mechanical aortic valve  CAD w/ prior CABG P:  Cont tele Cont IVFs SBP goal > 90   RENAL A:   NAG metabolic acidosis -->was in DKA prior; now on sodium acetate gtt  Hyperchloremia  Hypokalemia  P:   Trend serial chemistries Strict I&O Avoid hypotension   GASTROINTESTINAL A:   No acute  P:   NPO   HEMATOLOGIC A:   Anemia of chronic  disease w/out evidence of bleeding Chronic anticoagulation  Leukocytosis--> ? Reactive  P:  Trend CBC Heparin and coumadin per pharmacy   INFECTIOUS A:   SIRS r/o sepsis -->no clear source. Does have leukocytosis P:   F/u cultures Empiric abx reasonable for now Repeat PCT in am   ENDOCRINE A:   Hyperglycemia  DKA P:   DKA protocol   NEUROLOGIC A:   Acute encephalopathy-->suspect primarily from her metabolic derangements  P:   RASS goal: 0 Hold sedating meds Supportive care    FAMILY  - Updates:   - Inter-disciplinary family meet or Palliative Care meeting due by:  6/31   Erick Colace ACNP-BC [place] Pager # [phone] OR # [phone] if no answer  05/11/2016, 10:31 AM   55 yo female was in hospital for DKA and sepsis >> d/c on 6/20.  Returned 6/22 with DKA and hypotension with concern for sepsis.  She was started on IV insulin, Abx, and given IV fluids.  She required pressors.  She feels weak.  Denies chest/abdominal pain.  Nausea better.  Sleepy, follows commands.  HR regular.  Abd soft.  No wheeze.  K 3.3, HCO3 14, Creatinine 0.67, Hb 8.1, WBC 14.9, Procalcitonin 5.13  CXR - no infiltrate  Assessment/plan: DKA. Hypotension from sepsis (?source), hypovolemia. - IV insulin - IV fluid - wean off pressors to keep MAP > 65 - keep femoral CVL for now - continue Abx  Non gap acidosis. - continue sodium acetate  Hx of CAD, AVR. - continue heparin gtt  CC time by me independent of APP time 32 minutes.  Chesley Mires, MD [facility] Pulmonary/Critical Care 05/11/2016, 11:40 AM Pager:  [phone] After 3pm call: [phone]

## 2016-05-11 NOTE — Progress Notes (Addendum)
Notified [place] NP of last CBG's, lab attempting to obtain b met and heparin level.

## 2016-05-11 NOTE — Progress Notes (Addendum)
Text Paged [place] NP bmet results.

## 2016-05-11 NOTE — Progress Notes (Signed)
eLink Physician-Brief Progress Note Patient Name: Madison [name] DOB: 1961-09-27 MRN: [id number]   Date of Service  05/11/2016  HPI/Events of Note  Notified by off site hospitalist coverage of persistent hypotension. Patient not assessed at bedside by practitioner requesting consult. Camera check shows patient awake and conversant in bed. Patient denying any pain or difficulty breathing. Blood pressure now 119/45 with mean arterial pressure 70. Has received repeated boluses of IV fluid. Leukocytosis is present. Lactic acidosis has resolved. Anion gap from DKA has closed. Currently on heparin infusion for mechanical valve.   eICU Interventions  1. Formal consult by rounding physician in the a.m. unless clinical status worsens 2. Checking stat CBC & troponin I every 6 hours 3 3. Cultures already obtained 4. Continuing vancomycin & Zosyn 5. Neo-Synephrine drip ordered to be started as needed to maintain systolic blood pressure & mean arterial pressure      Intervention Category Evaluation Type: New Patient Evaluation  [name] 05/11/2016, 4:43 AM

## 2016-05-11 NOTE — Progress Notes (Signed)
Inpatient Diabetes Program Recommendations  AACE/ADA: New Consensus Statement on Inpatient Glycemic Control (2015)  Target Ranges:  Prepandial:   less than 140 mg/dL      Peak postprandial:   less than 180 mg/dL (1-2 hours)      Critically ill patients:  140 - 180 mg/dL   Lab Results  Component Value Date   GLUCAP 136* 05/11/2016   HGBA1C 9.9* 01/28/2016    Review of Glycemic Control  Results for DARICA, GOREN (MRN [id number]) as of 05/11/2016 13:42  Ref. Range 05/11/2016 05:54 05/11/2016 06:44 05/11/2016 08:06 05/11/2016 09:18 05/11/2016 10:34  Glucose-Capillary Latest Ref Range: 65-99 mg/dL 585 (H) 277 (H) 824 (H) 139 (H) 136 (H)   Inpatient Diabetes Program Recommendations:   Home medications: Levemir 20 units daily, Novolog 0-9 units tid  Inpatient medications: Levemir 10 units bid , Novolog 0-24 units q4h  Agree with current diabetes medication orders.    Consider a V-Go insulin infusion device for insulin administration when the patient is discharged home. The V-Go can be ordered as V-Go 20 (20units basal) , V-Go 30 (30units basal) or V-Go 40 (40 units of basal insulin) and is placed on the skin once a day (this could be done/in the presence of the care provider).  The patient pushes a button on the device to administer bolus insulin in 2 unit increments. Even if the patient does not give her bolus insulin, it will deliver her basal insulin and may decrease her risk of DKA and subsequent admissions.   The device is disposable, is filled daily with insulin and has a adhesive strip that stays on the skin for the 24 hour period.  It is a mechanical insulin infusion device.    Susette Racer, RN, BA, MHA, CDE Diabetes Coordinator Inpatient Diabetes Program  [phone] (Team Pager) [phone] [facility] Office) 05/11/2016 2:07 PM

## 2016-05-12 DIAGNOSIS — I959 Hypotension, unspecified: Secondary | ICD-10-CM

## 2016-05-12 DIAGNOSIS — E0810 Diabetes mellitus due to underlying condition with ketoacidosis without coma: Secondary | ICD-10-CM

## 2016-05-12 DIAGNOSIS — Z954 Presence of other heart-valve replacement: Secondary | ICD-10-CM

## 2016-05-12 DIAGNOSIS — I2510 Atherosclerotic heart disease of native coronary artery without angina pectoris: Secondary | ICD-10-CM

## 2016-05-12 LAB — CBC
HCT: 20.1 % — ABNORMAL LOW (ref 36.0–46.0)
Hemoglobin: 6.7 g/dL — CL (ref 12.0–15.0)
MCH: 27.6 pg (ref 26.0–34.0)
MCHC: 33.3 g/dL (ref 30.0–36.0)
MCV: 82.7 fL (ref 78.0–100.0)
Platelets: 239 10*3/uL (ref 150–400)
RBC: 2.43 MIL/uL — ABNORMAL LOW (ref 3.87–5.11)
RDW: 16.8 % — ABNORMAL HIGH (ref 11.5–15.5)
WBC: 7.4 10*3/uL (ref 4.0–10.5)

## 2016-05-12 LAB — RENAL FUNCTION PANEL
ALBUMIN: 1.8 g/dL — AB (ref 3.5–5.0)
ANION GAP: 3 — AB (ref 5–15)
BUN: <5 mg/dL — ABNORMAL LOW (ref 6–20)
CALCIUM: 7.5 mg/dL — AB (ref 8.9–10.3)
CO2: 24 mmol/L (ref 22–32)
Chloride: 112 mmol/L — ABNORMAL HIGH (ref 101–111)
Creatinine, Ser: 0.58 mg/dL (ref 0.44–1.00)
Glucose, Bld: 92 mg/dL (ref 65–99)
PHOSPHORUS: 1.6 mg/dL — AB (ref 2.5–4.6)
Potassium: 3.4 mmol/L — ABNORMAL LOW (ref 3.5–5.1)
SODIUM: 139 mmol/L (ref 135–145)

## 2016-05-12 LAB — GLUCOSE, CAPILLARY
GLUCOSE-CAPILLARY: 102 mg/dL — AB (ref 65–99)
GLUCOSE-CAPILLARY: 135 mg/dL — AB (ref 65–99)
GLUCOSE-CAPILLARY: 151 mg/dL — AB (ref 65–99)
GLUCOSE-CAPILLARY: 75 mg/dL (ref 65–99)
Glucose-Capillary: 90 mg/dL (ref 65–99)
Glucose-Capillary: 76 mg/dL (ref 65–99)
Glucose-Capillary: 74 mg/dL (ref 65–99)

## 2016-05-12 LAB — PROTIME-INR
INR: 1.15 (ref 0.00–1.49)
PROTHROMBIN TIME: 14.9 s (ref 11.6–15.2)

## 2016-05-12 LAB — PREPARE RBC (CROSSMATCH)

## 2016-05-12 LAB — MAGNESIUM: MAGNESIUM: 1.6 mg/dL — AB (ref 1.7–2.4)

## 2016-05-12 LAB — HEPARIN LEVEL (UNFRACTIONATED)
HEPARIN UNFRACTIONATED: 0.36 [IU]/mL (ref 0.30–0.70)
Heparin Unfractionated: 0.2 IU/mL — ABNORMAL LOW (ref 0.30–0.70)

## 2016-05-12 MED ORDER — HEPARIN (PORCINE) IN NACL 100-0.45 UNIT/ML-% IJ SOLN
800.0000 [IU]/h | INTRAMUSCULAR | Status: DC
  Administered 2016-05-13: 800 [IU]/h via INTRAVENOUS
  Filled 2016-05-12 (×2): qty 250

## 2016-05-12 MED ORDER — SODIUM CHLORIDE 0.9 % IV SOLN: Freq: Once | INTRAVENOUS | Status: DC

## 2016-05-12 MED ORDER — WARFARIN SODIUM 5 MG PO TABS
10.0000 mg | ORAL_TABLET | Freq: Once | ORAL | Status: AC
Start: 2016-05-12 — End: 2016-05-12
  Administered 2016-05-12: 10 mg via ORAL
  Filled 2016-05-12: qty 2

## 2016-05-12 MED ORDER — K PHOS MONO-SOD PHOS DI & MONO 155-852-130 MG PO TABS
250.0000 mg | ORAL_TABLET | Freq: Two times a day (BID) | ORAL | Status: DC
  Administered 2016-05-12 – 2016-05-14 (×5): 250 mg via ORAL
  Filled 2016-05-12 (×8): qty 1

## 2016-05-12 MED ORDER — MAGNESIUM SULFATE 2 GM/50ML IV SOLN
2.0000 g | Freq: Once | INTRAVENOUS | Status: AC
  Administered 2016-05-12: 2 g via INTRAVENOUS
  Filled 2016-05-12: qty 50

## 2016-05-12 MED ORDER — POTASSIUM CHLORIDE 10 MEQ/50ML IV SOLN
10.0000 meq | INTRAVENOUS | Status: AC
  Administered 2016-05-12 (×4): 10 meq via INTRAVENOUS
  Filled 2016-05-12 (×4): qty 50

## 2016-05-12 MED ORDER — WARFARIN - PHARMACIST DOSING INPATIENT: Freq: Every day | Status: DC

## 2016-05-12 NOTE — Progress Notes (Signed)
[facility] PROGRESS NOTE  Madison [name] FGH:[id number] DOB: [birth year] DOA: 05/10/2016  PCP: [name], NP  Brief HPI: 55 year old female with a past medical history of hypertension, diastolic CHF, mechanical aortic valve, diabetes, frequent hospitalizations for DKA, noncompliance, presented with altered mental status and elevated blood glucose levels. And was noted to be in diabetic ketoacidosis. She was hospitalized for management.  Past medical history:  Past Medical History  Diagnosis Date  . CAD (coronary artery disease)   . Obesity   . Hypercholesteremia   . HTN (hypertension)   . Dyslipidemia   . Aortic stenosis   . Thrombophlebitis   . Heart murmur   . Myocardial infarction (HCC) 12/2014  . Pneumonia 11/2014; 12/2014  . GERD (gastroesophageal reflux disease)   . Stroke syndrome [facility]) 1995; 2001    "when my son was born; problems w/speech and L hand since then" (01/19/2015)  . Anxiety   . Depression   . Allergy   . CHF (congestive heart failure) (HCC)   . Rheumatoid arthritis(714.0)     "hands" (07/01/2015)  . Diabetes (HCC) dx'd 1982    "went straight to insulin"    Consultants: Critical care medicine  Procedures: Femoral central line  Antibiotics: Vancomycin and Zosyn  Subjective: Patient states that she is feeling better this morning. Denies any nausea, vomiting. No abdominal pain. No cough or shortness of breath currently.  Objective:  Vital Signs  Filed Vitals:   05/12/16 0800 05/12/16 0900 05/12/16 1000 05/12/16 1100  BP: 131/51 149/71 113/42 124/52  Pulse: 59 77 71 73  Temp: 98.8 F (37.1 C)     TempSrc: Oral     Resp: 11 13 0 0  Height:      Weight:      SpO2: 98% 100% 100% 90%    Intake/Output Summary (Last 24 hours) at 05/12/16 1151 Last data filed at 05/12/16 1114  Gross per 24 hour  Intake 3181.12 ml  Output   3115 ml  Net  66.12 ml   Filed Weights   05/10/16 0941 05/10/16 1200  Weight: 58.06 kg (128 lb) 60.1 kg (132 lb  7.9 oz)    General appearance: alert, cooperative, appears stated age and no distress Head: Normocephalic, without obvious abnormality, atraumatic Resp: Diminished air entry at the bases. No wheezing. No crackles. No rhonchi. Cardio: regular rate and rhythm, S1, S2 normal, no murmur, click, rub or gallop GI: soft, non-tender; bowel sounds normal; no masses,  no organomegaly Extremities: extremities normal, atraumatic, no cyanosis or edema Neurologic: Alert and oriented X 3, no focal neurological deficits.  Lab Results:  Data Reviewed: I have personally reviewed following labs and imaging studies  CBC:  Recent Labs Lab 05/08/16 0803 05/10/16 0918 05/11/16 0540 05/12/16 0500  WBC 4.4 19.3* 14.9* 7.4  NEUTROABS  --  15.2* 11.6*  --   HGB 8.8* 8.5* 8.1* 6.7*  HCT 28.0* 29.3* 25.3* 20.1*  MCV 88.3 98.0 86.9 82.7  PLT 214 470* 323 239   Basic Metabolic Panel:  Recent Labs Lab 05/11/16 0432 05/11/16 0830 05/11/16 1348 05/11/16 2200 05/12/16 0500  NA 137 135 136 136 139  K 3.6 3.3* 2.9* 3.2* 3.4*  CL 117* 114* 115* 111 112*  CO2 12* 14* 18* 20* 24  GLUCOSE 244* 157* 118* 120* 92  BUN 10 9 6  <5* <5*  CREATININE 0.58 0.67 0.55 0.54 0.58  CALCIUM 6.8* 7.0* 6.9* 7.2* 7.5*  MG  --   --   --   --  1.6*  PHOS  --   --   --   --  1.6*   GFR: Estimated Creatinine Clearance: 65.7 mL/min (by C-G formula based on Cr of 0.58). Liver Function Tests:  Recent Labs Lab 05/10/16 0752 05/12/16 0500  AST 30  --   ALT 20  --   ALKPHOS 102  --   BILITOT 2.0*  --   PROT 6.0*  --   ALBUMIN 3.0* 1.8*   Recent Labs Lab 05/11/16 1348  AMMONIA <9*   Coagulation Profile:  Recent Labs Lab 05/06/16 0255 05/07/16 0456 05/08/16 0803 05/10/16 1335 05/12/16 0835  INR 1.30 1.58* 1.95* 1.43 1.15   Cardiac Enzymes:  Recent Labs Lab 05/11/16 0540 05/11/16 1348 05/11/16 2200  TROPONINI 0.42* 0.33* 0.25*   CBG:  Recent Labs Lab 05/11/16 1532 05/11/16 2113 05/12/16 0020  05/12/16 0355 05/12/16 0833  GLUCAP 93 104* 135* 90 76   Urine analysis:    Component Value Date/Time   COLORURINE YELLOW 05/10/2016 1002   APPEARANCEUR CLEAR 05/10/2016 1002   LABSPEC 1.023 05/10/2016 1002   PHURINE 5.0 05/10/2016 1002   GLUCOSEU >1000* 05/10/2016 1002   HGBUR NEGATIVE 05/10/2016 1002   BILIRUBINUR NEGATIVE 05/10/2016 1002   BILIRUBINUR n 06/16/2015 1244   KETONESUR >80* 05/10/2016 1002   PROTEINUR NEGATIVE 05/10/2016 1002   PROTEINUR n 06/16/2015 1244   UROBILINOGEN 0.2 07/30/2015 1415   UROBILINOGEN 0.2 06/16/2015 1244   NITRITE NEGATIVE 05/10/2016 1002   NITRITE n 06/16/2015 1244   LEUKOCYTESUR NEGATIVE 05/10/2016 1002    Recent Results (from the past 240 hour(s))  Urine culture     Status: Abnormal   Collection Time: 05/10/16 10:02 AM  Result Value Ref Range Status   Specimen Description URINE, CLEAN CATCH  Final   Special Requests Normal  Final   Culture (A)  Final    1,000 COLONIES/mL INSIGNIFICANT GROWTH Performed at [facility]    Report Status 05/11/2016 FINAL  Final  MRSA PCR Screening     Status: None   Collection Time: 05/10/16 12:20 PM  Result Value Ref Range Status   MRSA by PCR NEGATIVE NEGATIVE Final    Comment:        The GeneXpert MRSA Assay (FDA approved for NASAL specimens only), is one component of a comprehensive MRSA colonization surveillance program. It is not intended to diagnose MRSA infection nor to guide or monitor treatment for MRSA infections.   Culture, blood (routine x 2)     Status: None (Preliminary result)   Collection Time: 05/10/16  1:02 PM  Result Value Ref Range Status   Specimen Description BLOOD RIGHT ARM  Final   Special Requests IN PEDIATRIC BOTTLE 3CC  Final   Culture   Final    NO GROWTH 1 DAY Performed at [facility]    Report Status PENDING  Incomplete  Culture, blood (routine x 2)     Status: None (Preliminary result)   Collection Time: 05/10/16  1:41 PM  Result Value Ref  Range Status   Specimen Description BLOOD RIGHT ANTECUBITAL  Final   Special Requests IN PEDIATRIC BOTTLE 1 CC  Final   Culture   Final    NO GROWTH 1 DAY Performed at [facility]    Report Status PENDING  Incomplete      Radiology Studies: Dg Chest Port 1 View  05/10/2016  CLINICAL DATA:  Diabetic ketoacidosis. EXAM: PORTABLE CHEST 1 VIEW COMPARISON:  05/02/2016 FINDINGS: Sternotomy wires are unchanged. Interval removal  of nasogastric tube and endotracheal tube. Lungs are adequately inflated and otherwise clear. Cardiomediastinal silhouette and remainder of the exam is unchanged. IMPRESSION: No active disease. Electronically Signed   By: Elberta Fortis M.D.   On: 05/10/2016 13:39     Medications:  Scheduled: . sodium chloride   Intravenous Once  . Chlorhexidine Gluconate Cloth  6 each Topical Q0600  . insulin aspart  0-24 Units Subcutaneous Q4H  . insulin detemir  10 Units Subcutaneous BID  . magnesium sulfate 1 - 4 g bolus IVPB  2 g Intravenous Once  . mupirocin ointment  1 application Nasal BID  . phosphorus  250 mg Oral BID  . piperacillin-tazobactam (ZOSYN)  IV  3.375 g Intravenous Q8H  . sodium chloride flush  3 mL Intravenous Q12H  . vancomycin  750 mg Intravenous Q12H   Continuous: . sodium chloride 100 mL/hr at 05/12/16 0830  . heparin 800 Units/hr (05/12/16 1117)   AST:[id number] **OR** acetaminophen, ondansetron **OR** ondansetron (ZOFRAN) IV  Assessment/Plan:  Active Problems:   CAD (coronary artery disease)   Rheumatoid arthritis (HCC)   Spastic hemiplegia affecting nondominant side (HCC)   NSTEMI (non-ST elevated myocardial infarction) (HCC)   S/P aortic valve replacement   DKA, type 1 (HCC)   Chronic systolic heart failure (HCC)   AKI (acute kidney injury) (HCC)   Type I diabetes mellitus with complication, uncontrolled (HCC)   Sepsis (HCC)    Severe diabetic ketoacidosis in the setting of diabetes mellitus. Anion gap has closed. She has  been transitioned to subcutaneous insulin. Continue to monitor CBGs. Patient has a long-standing history of noncompliance.  Hypotension Thought to be predominantly due to hypovolemia rather than a sepsis-induced process. Patient's blood pressure has improved with IV fluids. She required Neo-Synephrine infusion. Critical care medicine was consulted. Off the infusion as of this morning. Blood pressures are stable. Continue to monitor for now.  Acute encephalopathy. Most likely secondary to DKA. No focal neurological signs are noted. Mental status seems to be back to baseline currently.  History of mechanical aortic valve INR was subtherapeutic. She is currently on an IV heparin infusion. Warfarin can be resumed.  Leukocytosis/presumed sepsis/infection. No obvious source has been identified. WBC has improved. She was started on broad-spectrum antibiotics. Follow-up on blood cultures. De-escalate as soon as possible.  Mild acute kidney injury/hyperkalemia/hypophosphatemia and hypomagnesemia Secondary to hypovolemia. Now back to baseline. Hyperkalemia has also resolved. Replete her electrolytes.  Mildly elevated troponin/history of CAD Likely due to demand ischemia. Recent echo report from June 14 reviewed. No wall motion abnormalities were noted. Continue home medications.  Normocytic anemia. No overt bleeding identified. Drop in hemoglobin is most likely dilutional. Transfuse 1 unit of blood for now. Recheck tomorrow morning.  DVT Prophylaxis: On IV heparin    Code Status: Full code  Family Communication: Discussed with the patient  Disposition Plan: Continue management as outlined above. Mobilize as tolerated. Should be able to discontinue Foley catheter later today. Remove the central line as soon as feasible.    LOS: 2 days   [facility]  [facility] Pager [phone] 05/12/2016, 11:51 AM  If 7PM-7AM, please contact night-coverage at [email or web address], password [facility]

## 2016-05-12 NOTE — Progress Notes (Signed)
PULMONARY / CRITICAL CARE MEDICINE   Name: Madison [name] MRN: [id number] DOB: 05-16-1961    ADMISSION DATE:  05/10/2016 CONSULTATION DATE:  6/23  REFERRING MD:  Sunnie Nielsen   CHIEF COMPLAINT:  Hypotension   HISTORY OF PRESENT ILLNESS:    55 year old white female who was just discharged from the hospital on 6/20 after being hospitalized for DKA, +/- sepsis. Called EMS on 6/22 for hyperglycemia. By the time she arrived and was evaluated by EDP was confused and tachypnic. femoral line was placed by EDP, she was administered several liters of crystalloid and admitted to the intensive care w/ working dx of DKA, encephalopathy and hypotension. She had initially been volume responsive but over night and into the am on 6/23 got placed on neo gtt so PCCM asked to eval   SUBJECTIVE:  Afebrile Off insulin gtt since am Off neo gtt  VITAL SIGNS: BP 120/54 mmHg  Pulse 72  Temp(Src) 98.8 F (37.1 C) (Oral)  Resp 0  Ht 5\' 3"  (1.6 m)  Wt 132 lb 7.9 oz (60.1 kg)  BMI 23.48 kg/m2  SpO2 100%  HEMODYNAMICS:    VENTILATOR SETTINGS:    INTAKE / OUTPUT: I/O last 3 completed shifts: In: 79 [I.V.:4435; IV Piggyback:1150] Out: 2820 [Urine:2820]  PHYSICAL EXAMINATION: General:  Chronically ill appearing white female, resting in bed. No distress  Neuro:  Alert, interactive, non focal HEENT:  NCAT, no JVD her MM are moist  Cardiovascular:  RRR, no murmur + click  Lungs:  Clear, no accessory use.  Abdomen:  Soft, not tender + bowel sounds no OM  Musculoskeletal:  Equal st and bulk Skin:  Warm and dry   LABS:  BMET  Recent Labs Lab 05/11/16 1348 05/11/16 2200 05/12/16 0500  NA 136 136 139  K 2.9* 3.2* 3.4*  CL 115* 111 112*  CO2 18* 20* 24  BUN 6 <5* <5*  CREATININE 0.55 0.54 0.58  GLUCOSE 118* 120* 92    Electrolytes  Recent Labs Lab 05/11/16 1348 05/11/16 2200 05/12/16 0500  CALCIUM 6.9* 7.2* 7.5*  MG  --   --  1.6*  PHOS  --   --  1.6*    CBC  Recent Labs Lab  05/10/16 0918 05/11/16 0540 05/12/16 0500  WBC 19.3* 14.9* 7.4  HGB 8.5* 8.1* 6.7*  HCT 29.3* 25.3* 20.1*  PLT 470* 323 239    Coag's  Recent Labs Lab 05/07/16 0456 05/08/16 0803 05/10/16 1335 05/10/16 1958  APTT  --   --   --  140*  INR 1.58* 1.95* 1.43  --     Sepsis Markers  Recent Labs Lab 05/10/16 1958 05/11/16 0829 05/11/16 1348  LATICACIDVEN 1.9 3.3* 0.9  PROCALCITON 5.13  --   --     ABG  Recent Labs Lab 05/10/16 0810  PHART 7.070*  PCO2ART 8.8*  PO2ART 131*    Liver Enzymes  Recent Labs Lab 05/10/16 0752 05/12/16 0500  AST 30  --   ALT 20  --   ALKPHOS 102  --   BILITOT 2.0*  --   ALBUMIN 3.0* 1.8*    Cardiac Enzymes  Recent Labs Lab 05/11/16 0540 05/11/16 1348 05/11/16 2200  TROPONINI 0.42* 0.33* 0.25*    Glucose  Recent Labs Lab 05/11/16 1034 05/11/16 1532 05/11/16 2113 05/12/16 0020 05/12/16 0355 05/12/16 0833  GLUCAP 136* 93 104* 135* 90 76    Imaging No results found.   STUDIES:    CULTURES: UC 6/22: insig growth  BCX2 6/22>>>  ANTIBIOTICS: vanc 6/22>>> Zosyn 6/22>>>  SIGNIFICANT EVENTS: 6/22 admitted w/ DKA and dehydration   LINES/TUBES: 6/22 left femoral CVL>>>  DISCUSSION: 55 year old female w/ DKA and medical non-compliance. Just d/c'd for DKA 6/20 to home. Re-admitted w/ same on 6/22. Gap now closed, glucose better controlled,  Off neo gtt  ASSESSMENT / PLAN:   CARDIOVASCULAR A:  Hypotension -->suspect that this was still hypovolemia - resolved SIRS-->doubt sepsis H/o mechanical aortic valve  CAD w/ prior CABG P:  Cont tele Cont IVFs SBP goal > 90 , off neo gtt  RENAL A:   NAG metabolic acidosis -->was in DKA prior  Hyperchloremia  Hypokalemia /hypophos P:   Trend serial chemistries Strict I&O Can dc acetate Replete lytes  GASTROINTESTINAL A:   No acute  P:   Advance diet  HEMATOLOGIC A:   Anemia of chronic disease w/out evidence of bleeding Chronic  anticoagulation  Leukocytosis--> ? Reactive , resolved P:  Trend CBC Heparin and coumadin per pharmacy   INFECTIOUS A:   SIRS r/o sepsis -->no clear source. Had leukocytosis P:   Can dc abx if  Cultures neg &  PCT is low   ENDOCRINE A:   Hyperglycemia  DKA P:   Off insulin gtt  NEUROLOGIC A:   Acute encephalopathy-->suspect primarily from her metabolic derangements  P:   RASS goal: 0 Hold sedating meds Supportive care    PCCM available as needed Can transfer to tele if remains off pressors x 6h  Cyril Mourning MD. FCCP. [facility] Pager [phone] If no response call [phone]    05/12/2016, 10:24 AM

## 2016-05-12 NOTE — Progress Notes (Signed)
eLink Physician-Brief Progress Note Patient Name: MARESA MORASH DOB: 1961/03/02 MRN: [id number]   Date of Service  05/12/2016  HPI/Events of Note  Hypokalemia 3.2. Serum creatinine 0.54. Has central venous access. Transitioned from insulin drip to subcutaneous insulin with Levemir.   eICU Interventions  1. KCl 10 mEq IV 4 runs  2. Ordering a.m. electrolytes      Intervention Category Intermediate Interventions: Electrolyte abnormality - evaluation and management  [name] 05/12/2016, 12:15 AM

## 2016-05-12 NOTE — Progress Notes (Addendum)
Pharmacy - Heparin IV  Assessment:    Please see note from Perlie Gold, PharmD earlier today for full details.  Briefly, 55 y.o. female on IV heparin/warfarin for mechanical valve.  Previously with low heparin level and rate increased.  Heparin level now therapeutic at 0.36 on 800 units/hr  Plan:   Continue heparin at 800 units/hr  Daily heparin level, CBC  Bernadene Person, PharmD, BCPS Pager: [phone] 05/12/2016, 6:34 PM

## 2016-05-12 NOTE — Progress Notes (Signed)
ANTICOAGULATION CONSULT NOTE - Follow Up Consult  Pharmacy Consult for heparin Indication: Mechanical valve  Allergies  Allergen Reactions  . Adhesive [Tape] Other (See Comments)    Can burn skin if left on too long  . Celebrex [Celecoxib] Rash  . Detrol [Tolterodine] Hives    Patient Measurements: Height: 5\' 3"  (160 cm) Weight: 132 lb 7.9 oz (60.1 kg) IBW/kg (Calculated) : 52.4 Heparin Dosing Weight:   Vital Signs: Temp: 99.1 F (37.3 C) (06/23 2100) Temp Source: Oral (06/23 1600) BP: 106/46 mmHg (06/23 1845) Pulse Rate: 74 (06/23 1845)  Labs:  Recent Labs  05/10/16 0918 05/10/16 1335 05/10/16 1958  05/11/16 0540 05/11/16 0830 05/11/16 1348 05/11/16 1524 05/11/16 2200 05/11/16 2241  HGB 8.5*  --   --   --  8.1*  --   --   --   --   --   HCT 29.3*  --   --   --  25.3*  --   --   --   --   --   PLT 470*  --   --   --  323  --   --   --   --   --   APTT  --   --  140*  --   --   --   --   --   --   --   LABPROT  --  17.5*  --   --   --   --   --   --   --   --   INR  --  1.43  --   --   --   --   --   --   --   --   HEPARINUNFRC  --   --   --   < >  --  0.79*  --  0.71*  --  0.32  CREATININE  --  1.17* 0.69  < >  --  0.67 0.55  --  0.54  --   TROPONINI  --   --   --   --  0.42*  --  0.33*  --  0.25*  --   < > = values in this interval not displayed.  Estimated Creatinine Clearance: 65.7 mL/min (by C-G formula based on Cr of 0.54).   Medications:  Infusions:  . sodium chloride Stopped (05/10/16 2136)  . dextrose 5 % 1,000 mL with sodium acetate 100 mEq infusion 100 mL/hr at 05/11/16 1840  . heparin 650 Units/hr (05/12/16 0035)  . insulin (NOVOLIN-R) infusion Stopped (05/11/16 1100)  . phenylephrine (NEO-SYNEPHRINE) Adult infusion 5 mcg/min (05/11/16 2200)    Assessment: Patient with heparin level at goal but lower end of goal.  No heparin issues per RN.  Goal of Therapy:  Heparin level 0.3-0.7 units/ml Monitor platelets by anticoagulation protocol:  Yes   Plan:  Increase heparin to 650 units/hr to keep within goal range. Recheck level at 0800  05/13/16, Darlina Guys Crowford 05/12/2016,12:35 AM

## 2016-05-12 NOTE — Progress Notes (Addendum)
ANTICOAGULATION CONSULT NOTE - Follow Up  Pharmacy Consult for heparin Indication: mechanical valve  Allergies  Allergen Reactions  . Adhesive [Tape] Other (See Comments)    Can burn skin if left on too long  . Celebrex [Celecoxib] Rash  . Detrol [Tolterodine] Hives   Patient Measurements: Height: 5\' 3"  (160 cm) Weight: 132 lb 7.9 oz (60.1 kg) IBW/kg (Calculated) : 52.4 Heparin Dosing Weight: 60.1kg  Vital Signs: Temp: 98.8 F (37.1 C) (06/24 0800) Temp Source: Oral (06/24 0800) BP: 120/54 mmHg (06/24 0400) Pulse Rate: 72 (06/24 0400)  Labs:  Recent Labs  05/10/16 0918 05/10/16 1335 05/10/16 1958  05/11/16 0540  05/11/16 1348 05/11/16 1524 05/11/16 2200 05/11/16 2241 05/12/16 0500 05/12/16 0752  HGB 8.5*  --   --   --  8.1*  --   --   --   --   --  6.7*  --   HCT 29.3*  --   --   --  25.3*  --   --   --   --   --  20.1*  --   PLT 470*  --   --   --  323  --   --   --   --   --  239  --   APTT  --   --  140*  --   --   --   --   --   --   --   --   --   LABPROT  --  17.5*  --   --   --   --   --   --   --   --   --   --   INR  --  1.43  --   --   --   --   --   --   --   --   --   --   HEPARINUNFRC  --   --   --   < >  --   < >  --  0.71*  --  0.32  --  0.20*  CREATININE  --  1.17* 0.69  < >  --   < > 0.55  --  0.54  --  0.58  --   TROPONINI  --   --   --   --  0.42*  --  0.33*  --  0.25*  --   --   --   < > = values in this interval not displayed. Estimated Creatinine Clearance: 65.7 mL/min (by C-G formula based on Cr of 0.58).  Medical History: Past Medical History  Diagnosis Date  . CAD (coronary artery disease)   . Obesity   . Hypercholesteremia   . HTN (hypertension)   . Dyslipidemia   . Aortic stenosis   . Thrombophlebitis   . Heart murmur   . Myocardial infarction (HCC) 12/2014  . Pneumonia 11/2014; 12/2014  . GERD (gastroesophageal reflux disease)   . Stroke syndrome [facility]) 1995; 2001    "when my son was born; problems w/speech and L hand since  then" (01/19/2015)  . Anxiety   . Depression   . Allergy   . CHF (congestive heart failure) (HCC)   . Rheumatoid arthritis(714.0)     "hands" (07/01/2015)  . Diabetes (HCC) dx'd 1982    "went straight to insulin"   Assessment: 55 y.o. 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  On arrival, patient was confused and unable to give history.  Pharmacy consulted to dose heparin for mechanical valve when INR <2.  Warfarin dose 7.5mg  daily per anti-coag visit 6/12 with INR 3.4 - Goal INR 2.5-3.5  Last Warfarin dose unknown, admit INR 1.43  Heparin bolus 1600 units, infusion started at 1000 units/hr  Today, 05/12/2016   Heparin level 0.20 on 650 units/hr, sub therapeutic after rate reductions for elevated levels  No reported bleeding, no reported problems with infusion pump, IV site  Goal of Therapy:  Heparin level 0.3-0.7 units/ml "Monitor platelets by anticoagulation protocol Goal INR 2.5-3.5 per anti-coag visit notes   Plan:   Increase Heparin to 800 units/hr  Recheck Hep level in 8 hr  Daily CBC while on Heparin  Daily Hep level when at steady state  Daily PT/INR ordered, Warfarin dosed today when level resulted  Addendum 12N  INR 1.15 today, plan 10mg  Warfarin this am  Thank you,  PharmD Pager [phone] 05/12/2016, 10:13 AM

## 2016-05-13 DIAGNOSIS — E108 Type 1 diabetes mellitus with unspecified complications: Secondary | ICD-10-CM

## 2016-05-13 DIAGNOSIS — D649 Anemia, unspecified: Secondary | ICD-10-CM

## 2016-05-13 DIAGNOSIS — E1065 Type 1 diabetes mellitus with hyperglycemia: Secondary | ICD-10-CM

## 2016-05-13 LAB — COMPREHENSIVE METABOLIC PANEL
ALBUMIN: 2.1 g/dL — AB (ref 3.5–5.0)
ALK PHOS: 75 U/L (ref 38–126)
ALT: 17 U/L (ref 14–54)
AST: 34 U/L (ref 15–41)
Anion gap: 3 — ABNORMAL LOW (ref 5–15)
BILIRUBIN TOTAL: 0.7 mg/dL (ref 0.3–1.2)
BUN: 6 mg/dL (ref 6–20)
CALCIUM: 7.6 mg/dL — AB (ref 8.9–10.3)
CO2: 23 mmol/L (ref 22–32)
CREATININE: 0.52 mg/dL (ref 0.44–1.00)
Chloride: 114 mmol/L — ABNORMAL HIGH (ref 101–111)
GFR calc non Af Amer: >60 mL/min (ref >60)
GLUCOSE: 61 mg/dL — AB (ref 65–99)
Potassium: 3.7 mmol/L (ref 3.5–5.1)
SODIUM: 140 mmol/L (ref 135–145)
TOTAL PROTEIN: 4.5 g/dL — AB (ref 6.5–8.1)

## 2016-05-13 LAB — GLUCOSE, CAPILLARY
GLUCOSE-CAPILLARY: 45 mg/dL — AB (ref 65–99)
GLUCOSE-CAPILLARY: 71 mg/dL (ref 65–99)
GLUCOSE-CAPILLARY: 126 mg/dL — AB (ref 65–99)
GLUCOSE-CAPILLARY: 186 mg/dL — AB (ref 65–99)
GLUCOSE-CAPILLARY: 122 mg/dL — AB (ref 65–99)
Glucose-Capillary: 119 mg/dL — ABNORMAL HIGH (ref 65–99)
Glucose-Capillary: 70 mg/dL (ref 65–99)

## 2016-05-13 LAB — MAGNESIUM: Magnesium: 2.1 mg/dL (ref 1.7–2.4)

## 2016-05-13 LAB — PROTIME-INR
INR: 1.24 (ref 0.00–1.49)
Prothrombin Time: 15.7 seconds — ABNORMAL HIGH (ref 11.6–15.2)

## 2016-05-13 LAB — CBC
HCT: 26.2 % — ABNORMAL LOW (ref 36.0–46.0)
Hemoglobin: 8.8 g/dL — ABNORMAL LOW (ref 12.0–15.0)
MCH: 28.7 pg (ref 26.0–34.0)
MCHC: 33.6 g/dL (ref 30.0–36.0)
MCV: 85.3 fL (ref 78.0–100.0)
Platelets: 205 10*3/uL (ref 150–400)
RBC: 3.07 MIL/uL — ABNORMAL LOW (ref 3.87–5.11)
RDW: 16.5 % — ABNORMAL HIGH (ref 11.5–15.5)
WBC: 6.2 10*3/uL (ref 4.0–10.5)

## 2016-05-13 LAB — CALCIUM, IONIZED: Calcium, Ionized, Serum: 4.4 mg/dL — ABNORMAL LOW (ref 4.5–5.6)

## 2016-05-13 LAB — HEPARIN LEVEL (UNFRACTIONATED): Heparin Unfractionated: 0.33 IU/mL (ref 0.30–0.70)

## 2016-05-13 MED ORDER — SODIUM CHLORIDE 0.9% FLUSH
10.0000 mL | INTRAVENOUS | Status: DC | PRN
  Administered 2016-05-13 – 2016-05-14 (×2): 30 mL
  Filled 2016-05-13 (×2): qty 40

## 2016-05-13 MED ORDER — INSULIN DETEMIR 100 UNIT/ML ~~LOC~~ SOLN
8.0000 [IU] | Freq: Once | SUBCUTANEOUS | Status: AC
  Administered 2016-05-13: 8 [IU] via SUBCUTANEOUS
  Filled 2016-05-13: qty 0.08

## 2016-05-13 MED ORDER — DEXTROSE 50 % IV SOLN
INTRAVENOUS | Status: AC
  Administered 2016-05-13: 25 mL
  Filled 2016-05-13: qty 50

## 2016-05-13 MED ORDER — HEPARIN (PORCINE) IN NACL 100-0.45 UNIT/ML-% IJ SOLN
900.0000 [IU]/h | INTRAMUSCULAR | Status: DC
  Filled 2016-05-13: qty 250

## 2016-05-13 MED ORDER — PANTOPRAZOLE SODIUM 40 MG PO TBEC: 40.0000 mg | DELAYED_RELEASE_TABLET | Freq: Every day | ORAL | Status: DC

## 2016-05-13 MED ORDER — ALUM & MAG HYDROXIDE-SIMETH 200-200-20 MG/5ML PO SUSP
30.0000 mL | Freq: Four times a day (QID) | ORAL | Status: DC | PRN
  Administered 2016-05-13: 30 mL via ORAL
  Filled 2016-05-13: qty 30

## 2016-05-13 MED ORDER — INSULIN DETEMIR 100 UNIT/ML ~~LOC~~ SOLN
15.0000 [IU] | Freq: Every day | SUBCUTANEOUS | Status: DC
  Filled 2016-05-13: qty 0.15

## 2016-05-13 MED ORDER — ENOXAPARIN SODIUM 100 MG/ML ~~LOC~~ SOLN
90.0000 mg | SUBCUTANEOUS | Status: DC
  Administered 2016-05-13: 90 mg via SUBCUTANEOUS
  Filled 2016-05-13 (×3): qty 1

## 2016-05-13 MED ORDER — WARFARIN SODIUM 5 MG PO TABS
10.0000 mg | ORAL_TABLET | Freq: Once | ORAL | Status: AC
  Administered 2016-05-13: 10 mg via ORAL
  Filled 2016-05-13: qty 2

## 2016-05-13 MED ORDER — PANTOPRAZOLE SODIUM 40 MG PO TBEC
40.0000 mg | DELAYED_RELEASE_TABLET | Freq: Every day | ORAL | Status: DC
  Administered 2016-05-13 – 2016-05-14 (×2): 40 mg via ORAL
  Filled 2016-05-13 (×2): qty 1

## 2016-05-13 NOTE — Progress Notes (Signed)
ANTICOAGULATION CONSULT NOTE - Follow Up  Pharmacy Consult for Lovenox and Warfarin Indication: mechanical valve  Allergies  Allergen Reactions  . Adhesive [Tape] Other (See Comments)    Can burn skin if left on too long  . Celebrex [Celecoxib] Rash  . Detrol [Tolterodine] Hives   Patient Measurements: Height: 5\' 3"  (160 cm) Weight: 132 lb 7.9 oz (60.1 kg) IBW/kg (Calculated) : 52.4 Heparin Dosing Weight: 60.1kg  Vital Signs: Temp: 98.9 F (37.2 C) (06/25 0400) Temp Source: Oral (06/25 0400) BP: 134/47 mmHg (06/25 0731) Pulse Rate: 62 (06/25 0731)  Labs:  Recent Labs  05/10/16 1335 05/10/16 1958  05/11/16 0540  05/11/16 1348  05/11/16 2200  05/12/16 0500 05/12/16 0752 05/12/16 0835 05/12/16 1805 05/13/16 0336  HGB  --   --   --  8.1*  --   --   --   --   --  6.7*  --   --   --  8.8*  HCT  --   --   --  25.3*  --   --   --   --   --  20.1*  --   --   --  26.2*  PLT  --   --   --  323  --   --   --   --   --  239  --   --   --  205  APTT  --  140*  --   --   --   --   --   --   --   --   --   --   --   --   LABPROT 17.5*  --   --   --   --   --   --   --   --   --   --  14.9  --  15.7*  INR 1.43  --   --   --   --   --   --   --   --   --   --  1.15  --  1.24  HEPARINUNFRC  --   --   < >  --   < >  --   < >  --   < >  --  0.20*  --  0.36 0.33  CREATININE 1.17* 0.69  < >  --   < > 0.55  --  0.54  --  0.58  --   --   --  0.52  TROPONINI  --   --   --  0.42*  --  0.33*  --  0.25*  --   --   --   --   --   --   < > = values in this interval not displayed. Estimated Creatinine Clearance: 65.7 mL/min (by C-G formula based on Cr of 0.52).  Medical History: Past Medical History  Diagnosis Date  . CAD (coronary artery disease)   . Obesity   . Hypercholesteremia   . HTN (hypertension)   . Dyslipidemia   . Aortic stenosis   . Thrombophlebitis   . Heart murmur   . Myocardial infarction (HCC) 12/2014  . Pneumonia 11/2014; 12/2014  . GERD (gastroesophageal reflux disease)    . 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 Anxiety   . Depression   . Allergy   . CHF (congestive heart failure) (HCC)   .  Rheumatoid arthritis(714.0)     "hands" (07/01/2015)  . Diabetes (HCC) dx'd 1982    "went straight to insulin"   Assessment: 55 y.o. 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  On arrival, patient was confused and unable to give history.  Pharmacy consulted to dose heparin for mechanical valve when INR <2.  Warfarin dose 7.5mg  daily per anti-coag visit 6/12 with INR 3.4 - Goal INR 2.5-3.5  Last Warfarin dose unknown, admit INR 1.43  Heparin bolus 1600 units, infusion started at 1000 units/hr  Today, 05/13/2016   Heparin discontinued, bridge with Lovenox  INR 1.24 after 10mg  Warfarin  Goal of Therapy:  "Monitor platelets by anticoagulation protocol Goal INR 2.5-3.5 per anti-coag visit notes   Plan:   Lovenox 90 mg SQ q24 till INR > 2.5  Repeat Warfarin 10mg  today  Thank you,  PharmD Pager [phone] 05/13/2016, 8:13 AM

## 2016-05-13 NOTE — Progress Notes (Signed)
Hypoglycemic Event   CBG: 45  Treatment:  52ml D 50 IV  Symptoms:  none  Follow-up CBG: Time:0450 CBG Result:71  Possible Reasons for Event:  Poor po intake  Comments/MD notified:    Derry Skill

## 2016-05-13 NOTE — Progress Notes (Signed)
ANTICOAGULATION CONSULT NOTE - Follow Up Consult  Pharmacy Consult for Heparin Indication: mechanical valve  Allergies  Allergen Reactions  . Adhesive [Tape] Other (See Comments)    Can burn skin if left on too long  . Celebrex [Celecoxib] Rash  . Detrol [Tolterodine] Hives    Patient Measurements: Height: 5\' 3"  (160 cm) Weight: 132 lb 7.9 oz (60.1 kg) IBW/kg (Calculated) : 52.4 Heparin Dosing Weight:   Vital Signs: Temp: 98.9 F (37.2 C) (06/25 0400) Temp Source: Oral (06/25 0400) BP: 135/48 mmHg (06/24 2200) Pulse Rate: 73 (06/24 2200)  Labs:  Recent Labs  05/10/16 1335 05/10/16 1958  05/11/16 0540  05/11/16 1348  05/11/16 2200  05/12/16 0500 05/12/16 0752 05/12/16 0835 05/12/16 1805 05/13/16 0336  HGB  --   --   --  8.1*  --   --   --   --   --  6.7*  --   --   --  8.8*  HCT  --   --   --  25.3*  --   --   --   --   --  20.1*  --   --   --  26.2*  PLT  --   --   --  323  --   --   --   --   --  239  --   --   --  205  APTT  --  140*  --   --   --   --   --   --   --   --   --   --   --   --   LABPROT 17.5*  --   --   --   --   --   --   --   --   --   --  14.9  --  15.7*  INR 1.43  --   --   --   --   --   --   --   --   --   --  1.15  --  1.24  HEPARINUNFRC  --   --   < >  --   < >  --   < >  --   < >  --  0.20*  --  0.36 0.33  CREATININE 1.17* 0.69  < >  --   < > 0.55  --  0.54  --  0.58  --   --   --  0.52  TROPONINI  --   --   --  0.42*  --  0.33*  --  0.25*  --   --   --   --   --   --   < > = values in this interval not displayed.  Estimated Creatinine Clearance: 65.7 mL/min (by C-G formula based on Cr of 0.52).   Medications:  Infusions:  . sodium chloride 100 mL/hr (05/12/16 2204)  . heparin 800 Units/hr (05/13/16 0431)  . heparin      Assessment: Patient with heparin level at goal but lower end of goal.  No heparin issues per RN.  Goal of Therapy:  Heparin level 0.3-0.7 units/ml Monitor platelets by anticoagulation protocol: Yes   Plan:   Increase heparin to 900 units/hr Recheck level at [address] [place] 05/13/2016,6:14 AM

## 2016-05-13 NOTE — Progress Notes (Signed)
[facility] PROGRESS NOTE  Madison [name] OVF:[id number] DOB: [birth year] DOA: 05/10/2016  PCP: [name], NP  Brief HPI: 55 year old female with a past medical history of hypertension, diastolic CHF, mechanical aortic valve, diabetes, frequent hospitalizations for DKA, noncompliance, presented with altered mental status and elevated blood glucose levels. And was noted to be in diabetic ketoacidosis. She was hospitalized for management.  Past medical history:  Past Medical History  Diagnosis Date  . CAD (coronary artery disease)   . Obesity   . Hypercholesteremia   . HTN (hypertension)   . Dyslipidemia   . Aortic stenosis   . Thrombophlebitis   . Heart murmur   . Myocardial infarction (HCC) 12/2014  . Pneumonia 11/2014; 12/2014  . GERD (gastroesophageal reflux disease)   . 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 Anxiety   . Depression   . Allergy   . CHF (congestive heart failure) (HCC)   . Rheumatoid arthritis(714.0)     "hands" (07/01/2015)  . Diabetes (HCC) dx'd 1982    "went straight to insulin"    Consultants: Critical care medicine  Procedures: Femoral central line  Antibiotics: Vancomycin and Zosyn Will be discontinued on 6/25  Subjective: Patient feels better this morning. Denies any pain, nausea, vomiting. Denies any shortness of breath. States that she has been compliant with her medications at home.   Objective:  Vital Signs  Filed Vitals:   05/12/16 2100 05/12/16 2200 05/13/16 0000 05/13/16 0400  BP:  135/48    Pulse: 74 73    Temp:   99.2 F (37.3 C) 98.9 F (37.2 C)  TempSrc:   Oral Oral  Resp: 24 25    Height:      Weight:      SpO2: 100% 100%      Intake/Output Summary (Last 24 hours) at 05/13/16 0715 Last data filed at 05/13/16 0500  Gross per 24 hour  Intake 2099.57 ml  Output   4551 ml  Net -2451.43 ml   Filed Weights   05/10/16 0941 05/10/16 1200    Weight: 58.06 kg (128 lb) 60.1 kg (132 lb 7.9 oz)    General appearance: alert, cooperative, appears stated age and no distress Resp: Diminished air entry at the bases. No wheezing. No crackles. No rhonchi. Cardio: regular rate and rhythm, S1, S2 normal, no murmur, click, rub or gallop GI: soft, non-tender; bowel sounds normal; no masses,  no organomegaly Extremities: extremities normal, atraumatic, no cyanosis or edema Neurologic: Alert and oriented X 3, no focal neurological deficits.  Lab Results:  Data Reviewed: I have personally reviewed following labs and imaging studies  CBC:  Recent Labs Lab 05/08/16 0803 05/10/16 0918 05/11/16 0540 05/12/16 0500 05/13/16 0336  WBC 4.4 19.3* 14.9* 7.4 6.2  NEUTROABS  --  15.2* 11.6*  --   --   HGB 8.8* 8.5* 8.1* 6.7* 8.8*  HCT 28.0* 29.3* 25.3* 20.1* 26.2*  MCV 88.3 98.0 86.9 82.7 85.3  PLT 214 470* 323 239 205   Basic Metabolic Panel:  Recent Labs Lab 05/11/16 0830 05/11/16 1348 05/11/16 2200 05/12/16 0500 05/13/16 0336  NA 135 136 136 139 140  K 3.3* 2.9* 3.2* 3.4* 3.7  CL 114* 115* 111 112* 114*  CO2 14* 18* 20* 24 23  GLUCOSE 157* 118* 120* 92 61*  BUN 9 6 <5* <5* 6  CREATININE 0.67 0.55 0.54 0.58 0.52  CALCIUM 7.0*  6.9* 7.2* 7.5* 7.6*  MG  --   --   --  1.6* 2.1  PHOS  --   --   --  1.6*  --    GFR: Estimated Creatinine Clearance: 65.7 mL/min (by C-G formula based on Cr of 0.52). Liver Function Tests:  Recent Labs Lab 05/10/16 0752 05/12/16 0500 05/13/16 0336  AST 30  --  34  ALT 20  --  17  ALKPHOS 102  --  75  BILITOT 2.0*  --  0.7  PROT 6.0*  --  4.5*  ALBUMIN 3.0* 1.8* 2.1*    Recent Labs Lab 05/11/16 1348  AMMONIA <9*   Coagulation Profile:  Recent Labs Lab 05/07/16 0456 05/08/16 0803 05/10/16 1335 05/12/16 0835 05/13/16 0336  INR 1.58* 1.95* 1.43 1.15 1.24   Cardiac Enzymes:  Recent Labs Lab 05/11/16 0540 05/11/16 1348 05/11/16 2200  TROPONINI 0.42* 0.33* 0.25*    CBG:  Recent Labs Lab 05/12/16 1617 05/12/16 1955 05/12/16 2203 05/13/16 0415 05/13/16 0457  GLUCAP 151* 74 102* 45* 71   Urine analysis:    Component Value Date/Time   COLORURINE YELLOW 05/10/2016 1002   APPEARANCEUR CLEAR 05/10/2016 1002   LABSPEC 1.023 05/10/2016 1002   PHURINE 5.0 05/10/2016 1002   GLUCOSEU >1000* 05/10/2016 1002   HGBUR NEGATIVE 05/10/2016 1002   BILIRUBINUR NEGATIVE 05/10/2016 1002   BILIRUBINUR n 06/16/2015 1244   KETONESUR >80* 05/10/2016 1002   PROTEINUR NEGATIVE 05/10/2016 1002   PROTEINUR n 06/16/2015 1244   UROBILINOGEN 0.2 07/30/2015 1415   UROBILINOGEN 0.2 06/16/2015 1244   NITRITE NEGATIVE 05/10/2016 1002   NITRITE n 06/16/2015 1244   LEUKOCYTESUR NEGATIVE 05/10/2016 1002    Recent Results (from the past 240 hour(s))  Urine culture     Status: Abnormal   Collection Time: 05/10/16 10:02 AM  Result Value Ref Range Status   Specimen Description URINE, CLEAN CATCH  Final   Special Requests Normal  Final   Culture (A)  Final    1,000 COLONIES/mL INSIGNIFICANT GROWTH Performed at [facility]    Report Status 05/11/2016 FINAL  Final  MRSA PCR Screening     Status: None   Collection Time: 05/10/16 12:20 PM  Result Value Ref Range Status   MRSA by PCR NEGATIVE NEGATIVE Final    Comment:        The GeneXpert MRSA Assay (FDA approved for NASAL specimens only), is one component of a comprehensive MRSA colonization surveillance program. It is not intended to diagnose MRSA infection nor to guide or monitor treatment for MRSA infections.   Culture, blood (routine x 2)     Status: None (Preliminary result)   Collection Time: 05/10/16  1:02 PM  Result Value Ref Range Status   Specimen Description BLOOD RIGHT ARM  Final   Special Requests IN PEDIATRIC BOTTLE 3CC  Final   Culture   Final    NO GROWTH 2 DAYS Performed at [facility]    Report Status PENDING  Incomplete  Culture, blood (routine x 2)     Status: None  (Preliminary result)   Collection Time: 05/10/16  1:41 PM  Result Value Ref Range Status   Specimen Description BLOOD RIGHT ANTECUBITAL  Final   Special Requests IN PEDIATRIC BOTTLE 1 CC  Final   Culture   Final    NO GROWTH 2 DAYS Performed at [facility]    Report Status PENDING  Incomplete      Radiology Studies: No results found.  Medications:  Scheduled: . sodium chloride   Intravenous Once  . Chlorhexidine Gluconate Cloth  6 each Topical Q0600  . insulin aspart  0-24 Units Subcutaneous Q4H  . [START ON 05/14/2016] insulin detemir  15 Units Subcutaneous Daily  . insulin detemir  8 Units Subcutaneous Once  . mupirocin ointment  1 application Nasal BID  . phosphorus  250 mg Oral BID  . sodium chloride flush  3 mL Intravenous Q12H  . Warfarin - Pharmacist Dosing Inpatient   Does not apply q1800   Continuous: . sodium chloride 100 mL/hr (05/12/16 2204)  . heparin 900 Units/hr (05/13/16 0616)   OMA:[id number] **OR** acetaminophen, ondansetron **OR** ondansetron (ZOFRAN) IV  Assessment/Plan:  Active Problems:   CAD (coronary artery disease)   Rheumatoid arthritis (HCC)   Spastic hemiplegia affecting nondominant side (HCC)   NSTEMI (non-ST elevated myocardial infarction) (HCC)   S/P aortic valve replacement   DKA, type 1 (HCC)   Chronic systolic heart failure (HCC)   AKI (acute kidney injury) (HCC)   Type I diabetes mellitus with complication, uncontrolled (HCC)   Sepsis (HCC)    Severe diabetic ketoacidosis in the setting of Known diabetes mellitus with noncompliance. Anion gap has closed. She has been transitioned to subcutaneous insulin. She remains stable. Did have an episode of low blood glucose level this morning. Her Levemir dose will be adjusted. Continue to monitor CBGs. Patient has a long-standing history of noncompliance.  Leukocytosis/presumed sepsis/infection. No obvious source has been identified. WBC has improved. She was started on  broad-spectrum antibiotics. Blood cultures are negative. Okay to discontinue antibiotics.   Hypotension Now resolved. Thought to be predominantly due to hypovolemia rather than a sepsis-induced process. Patient's blood pressure has improved with IV fluids. She required Neo-Synephrine infusion. Critical care medicine was consulted. Off of the infusion as of 6/24. Blood pressures are stable. Continue to monitor for now.  Acute encephalopathy. Most likely secondary to DKA. No focal neurological signs are noted. Mental status seems to be back to baseline currently.  History of mechanical aortic valve INR was subtherapeutic. She is currently on an IV heparin infusion. Warfarin resumed. Okay to bridge with Lovenox.  Mild acute kidney injury/hyperkalemia/hypophosphatemia and hypomagnesemia Secondary to hypovolemia. Now back to baseline. Hyperkalemia has also resolved. Repleted her electrolytes.  Mildly elevated troponin/history of CAD Likely due to demand ischemia. Recent echo report from June 14 reviewed. No wall motion abnormalities were noted. Continue home medications.  Normocytic anemia. No overt bleeding identified. Drop in hemoglobin is most likely dilutional. Patient was transfused 1 unit of blood on 6/24. Hemoglobin has responded appropriately.   DVT Prophylaxis: On Lovenox and warfarin   Code Status: Full code  Family Communication: Discussed with the patient  Disposition Plan: Continue management as outlined above. Mobilize as tolerated. Discontinue Foley catheter today. Unfortunately, patient does not have any other IV access. We'll leave her femoral central line in for now. Anticipate discharge tomorrow.    LOS: 3 days   [facility]  [facility] Pager [phone] 05/13/2016, 7:15 AM  If 7PM-7AM, please contact night-coverage at [email or web address], password [facility]

## 2016-05-14 ENCOUNTER — Other Ambulatory Visit: Payer: Self-pay | Admitting: *Deleted

## 2016-05-14 LAB — TYPE AND SCREEN
ABO/RH(D): A POS
ANTIBODY SCREEN: NEGATIVE
Unit division: 0

## 2016-05-14 LAB — CBC
HCT: 26.3 % — ABNORMAL LOW (ref 36.0–46.0)
Hemoglobin: 8.7 g/dL — ABNORMAL LOW (ref 12.0–15.0)
MCH: 28.5 pg (ref 26.0–34.0)
MCHC: 33.1 g/dL (ref 30.0–36.0)
MCV: 86.2 fL (ref 78.0–100.0)
PLATELETS: 182 10*3/uL (ref 150–400)
RBC: 3.05 MIL/uL — AB (ref 3.87–5.11)
RDW: 16.3 % — ABNORMAL HIGH (ref 11.5–15.5)
WBC: 4.1 10*3/uL (ref 4.0–10.5)

## 2016-05-14 LAB — GLUCOSE, CAPILLARY
GLUCOSE-CAPILLARY: 68 mg/dL (ref 65–99)
Glucose-Capillary: 28 mg/dL — CL (ref 65–99)
Glucose-Capillary: 88 mg/dL (ref 65–99)
Glucose-Capillary: 146 mg/dL — ABNORMAL HIGH (ref 65–99)

## 2016-05-14 LAB — BASIC METABOLIC PANEL
Anion gap: 6 (ref 5–15)
CO2: 23 mmol/L (ref 22–32)
CREATININE: 0.53 mg/dL (ref 0.44–1.00)
Calcium: 8 mg/dL — ABNORMAL LOW (ref 8.9–10.3)
Chloride: 109 mmol/L (ref 101–111)
GFR calc Af Amer: >60 mL/min (ref >60)
Glucose, Bld: 142 mg/dL — ABNORMAL HIGH (ref 65–99)
Potassium: 4 mmol/L (ref 3.5–5.1)
SODIUM: 138 mmol/L (ref 135–145)

## 2016-05-14 LAB — PROTIME-INR
INR: 1.63 — ABNORMAL HIGH (ref 0.00–1.49)
PROTHROMBIN TIME: 19.4 s — AB (ref 11.6–15.2)

## 2016-05-14 MED ORDER — DEXTROSE 50 % IV SOLN
INTRAVENOUS | Status: AC
  Administered 2016-05-14: 25 mL
  Filled 2016-05-14: qty 50

## 2016-05-14 MED ORDER — ENOXAPARIN SODIUM 60 MG/0.6ML ~~LOC~~ SOLN
60.0000 mg | Freq: Two times a day (BID) | SUBCUTANEOUS | Status: DC
  Administered 2016-05-14: 60 mg via SUBCUTANEOUS

## 2016-05-14 MED ORDER — INSULIN DETEMIR 100 UNIT/ML ~~LOC~~ SOLN: 10.0000 [IU] | Freq: Every day | SUBCUTANEOUS | Status: DC

## 2016-05-14 MED ORDER — WARFARIN SODIUM 10 MG PO TABS
10.0000 mg | ORAL_TABLET | Freq: Once | ORAL | Status: DC
  Filled 2016-05-14: qty 1

## 2016-05-14 MED ORDER — WARFARIN SODIUM 7.5 MG PO TABS: 7.5000 mg | ORAL_TABLET | Freq: Every day | ORAL | Status: DC

## 2016-05-14 MED ORDER — ENOXAPARIN SODIUM 60 MG/0.6ML ~~LOC~~ SOLN: 60.0000 mg | Freq: Two times a day (BID) | SUBCUTANEOUS | Status: DC

## 2016-05-14 MED ORDER — INSULIN DETEMIR 100 UNIT/ML ~~LOC~~ SOLN
10.0000 [IU] | Freq: Every day | SUBCUTANEOUS | Status: DC
  Administered 2016-05-14: 10 [IU] via SUBCUTANEOUS
  Filled 2016-05-14: qty 0.1

## 2016-05-14 NOTE — Patient Outreach (Signed)
[facility]) Care Management  05/14/2016  Madison [name] Nov 08, 1961 [id number]  CSW noted that patient was discharged from [facility] today, returning home to live alone, but agreeable to home health services.  Patient has been encouraged to obtain placement into a long-term care facility for 24 hour care and supervision, but adamantly refuses.  Patient was admitted into the hospital on June 22nd, after having just spent time in the hospital the day prior.  Patient has been diagnosed with Severe Diabetic Ketoacidosis and is non-compliant with diet and medications.  Patient will receive Lovenox injections at home, which she will self administer.  Patient home health nurse will perform routine blood draws to check PT/INR.  Home health nurse will also assist with medication management. CSW made an attempt to try and contact patient today to perform phone assessment, as well as assess and assist with social needs and services, without success.  A HIPAA complaint message was left for patient on voicemail.  CSW is currently awaiting a return call. Danford Bad, BSW, MSW, LCSW  Licensed [employer] manager, [employer]  Mailing [place] [address] [place], [place] [address] Physical Address-[address], [place], [place] [address] Toll Free Main # [phone] Fax # [phone] Cell # [phone]  Fax # [phone]  [name]

## 2016-05-14 NOTE — Progress Notes (Addendum)
Hypoglycemic Event  CBG: 28   Treatment: D50 IV 25 mL given at 0430  Symptoms: Sweaty  Follow-up CBG: Time: 0445 CBG Result: 88  Possible Reasons for Event: Inadequate meal intake  Comments/MD notified   Rondel Jumbo

## 2016-05-14 NOTE — Progress Notes (Signed)
ANTICOAGULATION CONSULT NOTE - Follow Up  Pharmacy Consult for Lovenox and Warfarin Indication: mechanical aortic valve  Allergies  Allergen Reactions  . Adhesive [Tape] Other (See Comments)    Can burn skin if left on too long  . Celebrex [Celecoxib] Rash  . Detrol [Tolterodine] Hives   Patient Measurements: Height: 5\' 3"  (160 cm) Weight: 132 lb 7.9 oz (60.1 kg) IBW/kg (Calculated) : 52.4 Heparin Dosing Weight: 60.1kg  Vital Signs: Temp: 98 F (36.7 C) (06/26 0615) Temp Source: Oral (06/26 0615) BP: 134/60 mmHg (06/26 0615) Pulse Rate: 64 (06/26 0615)  Labs:  Recent Labs  05/11/16 1348  05/11/16 2200  05/12/16 0500 05/12/16 0752 05/12/16 0835 05/12/16 1805 05/13/16 0336 05/14/16 0645  HGB  --   --   --   < > 6.7*  --   --   --  8.8* 8.7*  HCT  --   --   --   --  20.1*  --   --   --  26.2* 26.3*  PLT  --   --   --   --  239  --   --   --  205 182  LABPROT  --   --   --   --   --   --  14.9  --  15.7* 19.4*  INR  --   --   --   --   --   --  1.15  --  1.24 1.63*  HEPARINUNFRC  --   < >  --   < >  --  0.20*  --  0.36 0.33  --   CREATININE 0.55  --  0.54  --  0.58  --   --   --  0.52 0.53  TROPONINI 0.33*  --  0.25*  --   --   --   --   --   --   --   < > = values in this interval not displayed. Estimated Creatinine Clearance: 65.7 mL/min (by C-G formula based on Cr of 0.53).  Medical History: Past Medical History  Diagnosis Date  . CAD (coronary artery disease)   . Obesity   . Hypercholesteremia   . HTN (hypertension)   . Dyslipidemia   . Aortic stenosis   . Thrombophlebitis   . Heart murmur   . Myocardial infarction (HCC) 12/2014  . Pneumonia 11/2014; 12/2014  . GERD (gastroesophageal reflux disease)   . 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 Anxiety   . Depression   . Allergy   . CHF (congestive heart failure) (HCC)   . Rheumatoid arthritis(714.0)     "hands" (07/01/2015)  .  Diabetes (HCC) dx'd 1982    "went straight to insulin"   Assessment: Patient is a 55 y.o F with frequent admission for DKA on warfarin PTA for mechanical AVR.  She presented to the ED on 6/22 with AMS and was found to be in DKA.  She was initially started on heparin for sub-therapeutic INR and transitioned to lovenox/warfarin on 6/25.  Of note, [facility] clinic note on 6/12 indicated that her warfarin regimen was 7.5 mg daily with goal INR 2.5-3.5.  Today, 05/14/2016  - INR is sub-therapeutic at 1.63 but is trending up - hgb/plt relatively stable today - no bleeding documented - diet: carb modified diet - no signif. Drug-drug intxns   Goal of Therapy:  Monitor platelets by  anticoagulation protocol Goal INR 2.5-3.5 per anti-coag visit notes   Plan:  - changed LMWH to 1 mg/kg q12h (60 mg bid)  +++ 1.5 mg/kg is ONLY for VTE indication +++ - warfarin 10 mg PO x1 today.  If to d/c patient home today, recommend resuming 7.5 mg daily starting on 6/27  - daily INR, cbc q72h - monitor for s/s of bleeding   Dorna Leitz, PharmD, BCPS 05/14/2016 9:03 AM

## 2016-05-14 NOTE — Discharge Summary (Signed)
[facility]  Physician Discharge Summary   Patient ID: Madison [name] MRN: [id number] DOB/AGE: 1961-10-27 55 y.o.  Admit date: 05/10/2016 Discharge date: 05/14/2016  PCP: Dorothyann Peng, NP  DISCHARGE DIAGNOSES:  Active Problems:   CAD (coronary artery disease)   Rheumatoid arthritis (HCC)   Spastic hemiplegia affecting nondominant side (HCC)   NSTEMI (non-ST elevated myocardial infarction) ([name])   S/P aortic valve replacement   DKA, type 1 (HCC)   Chronic systolic heart failure (HCC)   AKI (acute kidney injury) ([place])   Type I diabetes mellitus with complication, uncontrolled ([place])   Sepsis ([place])   RECOMMENDATIONS FOR OUTPATIENT FOLLOW UP: 1. Patient placed on Lovenox bridge for subtherapeutic INR 2. PT/INR to be checked by home health on Thursday    DISCHARGE CONDITION: fair  Diet recommendation: Modified carbohydrate  Filed Weights   05/10/16 0941 05/10/16 1200  Weight: 58.06 kg (128 lb) 60.1 kg (132 lb 7.9 oz)    INITIAL HISTORY: 55 year old female with a past medical history of hypertension, diastolic CHF, mechanical aortic valve, diabetes, frequent hospitalizations for DKA, noncompliance, presented with altered mental status and elevated blood glucose levels. And was noted to be in diabetic ketoacidosis. She was hospitalized for management.  Consultations:  Critical care medicine  Procedures:  Femoral central line placement. This was removed prior to discharge.  HOSPITAL COURSE:   Severe diabetic ketoacidosis in the setting of Known diabetes mellitus with noncompliance. Patient was admitted to the intensive care unit. She was placed on insulin infusion. She was given IV fluids. Her anion gap closed. She was transitioned to subcutaneous insulin. She appears to have brittle diabetes. She experienced hypoglycemic episodes during this hospitalization. She states that she gets these episodes even at home. Dose was further adjusted today and she was offered  another day stay in the hospital. However, patient declines and wants to go home. I also discussed the insulin administering device with her. She will pursue this further with her PCP. HbA1c was 9.9 in March.   Leukocytosis/presumed sepsis/infection. No obvious source has been identified. WBC has improved. She was initially started on broad-spectrum antibiotics. Blood cultures are negative. She remained afebrile. Antibiotics were discontinued.  Hypotension Blood pressure was noted to be low initially. Was thought to be due to hypovolemia. She required Neo-Synephrine briefly. Blood pressure has been stable.   Acute encephalopathy. Most likely secondary to DKA. No focal neurological signs are noted. Mental status is back to baseline currently.  History of mechanical aortic valve INR was subtherapeutic. She is currently on an IV heparin infusion. Warfarin resumed. Okay to bridge with Lovenox. INR to be checked by home health on Thursday. And if INR is 2.5 or greater, her Lovenox can be discontinued.  Mild acute kidney injury/hyperkalemia/hypophosphatemia and hypomagnesemia Secondary to hypovolemia. Now back to baseline. Hyperkalemia has also resolved. Repleted her electrolytes.  Mildly elevated troponin/history of CAD Likely due to demand ischemia. Recent echo report from June 14 reviewed. No wall motion abnormalities were noted. Continue home medications.  Normocytic anemia. No overt bleeding identified. Drop in hemoglobin is most likely dilutional. Patient was transfused 1 unit of blood on 6/24. Hemoglobin has responded appropriately.   Overall, stable. Patient very keen on going home today. She is agreeable to doing the Lovenox at home and does know how to administer this to herself. Agreeable to home health for blood draws. Home health also for medication management, considering frequent hospitalization for DKA.  Patient was discharged home before all the discharge instructions could  be  provided.   PERTINENT LABS:  The results of significant diagnostics from this hospitalization (including imaging, microbiology, ancillary and laboratory) are listed below for reference.    Microbiology: Recent Results (from the past 240 hour(s))  Urine culture     Status: Abnormal   Collection Time: 05/10/16 10:02 AM  Result Value Ref Range Status   Specimen Description URINE, CLEAN CATCH  Final   Special Requests Normal  Final   Culture (A)  Final    1,000 COLONIES/mL INSIGNIFICANT GROWTH Performed at [facility]    Report Status 05/11/2016 FINAL  Final  MRSA PCR Screening     Status: None   Collection Time: 05/10/16 12:20 PM  Result Value Ref Range Status   MRSA by PCR NEGATIVE NEGATIVE Final    Comment:        The GeneXpert MRSA Assay (FDA approved for NASAL specimens only), is one component of a comprehensive MRSA colonization surveillance program. It is not intended to diagnose MRSA infection nor to guide or monitor treatment for MRSA infections.   Culture, blood (routine x 2)     Status: None (Preliminary result)   Collection Time: 05/10/16  1:02 PM  Result Value Ref Range Status   Specimen Description BLOOD RIGHT ARM  Final   Special Requests IN PEDIATRIC BOTTLE 3CC  Final   Culture   Final    NO GROWTH 3 DAYS Performed at [facility]    Report Status PENDING  Incomplete  Culture, blood (routine x 2)     Status: None (Preliminary result)   Collection Time: 05/10/16  1:41 PM  Result Value Ref Range Status   Specimen Description BLOOD RIGHT ANTECUBITAL  Final   Special Requests IN PEDIATRIC BOTTLE 1 CC  Final   Culture   Final    NO GROWTH 3 DAYS Performed at [facility]    Report Status PENDING  Incomplete     Labs: Basic Metabolic Panel:  Recent Labs Lab 05/11/16 1348 05/11/16 2200 05/12/16 0500 05/13/16 0336 05/14/16 0645  NA 136 136 139 140 138  K 2.9* 3.2* 3.4* 3.7 4.0  CL 115* 111 112* 114* 109  CO2 18* 20* '24 23  23  ' GLUCOSE 118* 120* 92 61* 142*  BUN 6 <5* <5* 6 <5*  CREATININE 0.55 0.54 0.58 0.52 0.53  CALCIUM 6.9* 7.2* 7.5* 7.6* 8.0*  MG  --   --  1.6* 2.1  --   PHOS  --   --  1.6*  --   --    Liver Function Tests:  Recent Labs Lab 05/10/16 0752 05/12/16 0500 05/13/16 0336  AST 30  --  34  ALT 20  --  17  ALKPHOS 102  --  75  BILITOT 2.0*  --  0.7  PROT 6.0*  --  4.5*  ALBUMIN 3.0* 1.8* 2.1*    Recent Labs Lab 05/11/16 1348  AMMONIA <9*   CBC:  Recent Labs Lab 05/10/16 0918 05/11/16 0540 05/12/16 0500 05/13/16 0336 05/14/16 0645  WBC 19.3* 14.9* 7.4 6.2 4.1  NEUTROABS 15.2* 11.6*  --   --   --   HGB 8.5* 8.1* 6.7* 8.8* 8.7*  HCT 29.3* 25.3* 20.1* 26.2* 26.3*  MCV 98.0 86.9 82.7 85.3 86.2  PLT 470* 323 239 205 182   Cardiac Enzymes:  Recent Labs Lab 05/11/16 0540 05/11/16 1348 05/11/16 2200  TROPONINI 0.42* 0.33* 0.25*   BNP: BNP (last 3 results)  Recent Labs  07/30/15 1400  03/17/16 1801 03/27/16 0832  BNP 398.4* 95.3 73.7    CBG:  Recent Labs Lab 05/13/16 1958 05/14/16 0011 05/14/16 0409 05/14/16 0449 05/14/16 0729  GLUCAP 122* 68 28* 88 146*     IMAGING STUDIES Ct Head Wo Contrast  04/30/2016  CLINICAL DATA:  Acute onset of shortness of breath. Initial encounter. EXAM: CT HEAD WITHOUT CONTRAST TECHNIQUE: Contiguous axial images were obtained from the base of the skull through the vertex without intravenous contrast. COMPARISON:  CT of the head performed 02/26/2016 FINDINGS: There is no evidence of acute infarction, mass lesion, or intra- or extra-axial hemorrhage on CT. Prominence of the ventricles and sulci reflects mild to moderate cortical volume loss. 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 Diffuse periventricular and subcortical white matter change likely reflects small vessel ischemic microangiopathy. Small chronic  infarcts are noted at the cerebellar hemispheres bilaterally, more prominent on the left. The brainstem and fourth ventricle are within normal limits. The basal ganglia are unremarkable in appearance. No mass effect or midline shift is seen. There is no evidence of fracture; visualized osseous structures are unremarkable in appearance. The orbits are within normal limits. The paranasal sinuses and mastoid air cells are well-aerated. No significant soft tissue abnormalities are seen. IMPRESSION: 1. No acute intracranial pathology seen on CT. 2. Mild to moderate cortical volume loss and diffuse small vessel ischemic microangiopathy. 3. 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 4. Small chronic infarcts at the cerebellar hemispheres bilaterally, more prominent on the left. Electronically Signed   By: Garald Balding M.D.   On: 04/30/2016 23:43   Dg Chest Port 1 View  05/10/2016  CLINICAL DATA:  Diabetic ketoacidosis. EXAM: PORTABLE CHEST 1 VIEW COMPARISON:  05/02/2016 FINDINGS: Sternotomy wires are unchanged. Interval removal of nasogastric tube and endotracheal tube. Lungs are adequately inflated and otherwise clear. Cardiomediastinal silhouette and remainder of the exam is unchanged. IMPRESSION: No active disease. Electronically Signed   By: Marin Olp M.D.   On: 05/10/2016 13:39   Dg Chest Port 1 View  05/02/2016  CLINICAL DATA:  Hypoxia EXAM: PORTABLE CHEST 1 VIEW COMPARISON:  April 30, 2016 FINDINGS: Endotracheal tube tip is in the right main bronchus. Nasogastric tube tip and side port are in the stomach. No pneumothorax. There is no edema or consolidation. Heart size and pulmonary vascular normal. No adenopathy. IMPRESSION: Endotracheal tube tip in proximal right main bronchus. Advise withdrawing endotracheal tube approximately 4 to 4.5 cm. No pneumothorax. No edema or consolidation. Critical  Value/emergent results were called by telephone at the time of interpretation on 05/02/2016 at 7:01 am to Arletta Bale, RN, who verbally acknowledged these results. Electronically Signed   By: Lowella Grip III M.D.   On: 05/02/2016 07:02   Dg Chest Portable 1 View  04/30/2016  CLINICAL DATA:  Unresponsive patient. Endotracheal tube and enteric tubes were placed. Shortness of breath. EXAM: PORTABLE CHEST 1 VIEW COMPARISON:  03/26/2016 FINDINGS: Postoperative changes in the mediastinum. An endotracheal tube has been placed with tip measuring 3 cm above the carina. An enteric tube has been placed with tip in the left upper quadrant consistent with location in the upper stomach. Normal heart size and pulmonary vascularity. No focal airspace disease or consolidation in the lungs. No blunting of costophrenic angles. No pneumothorax. Mediastinal contours appear intact. IMPRESSION:  Appliances appear to be in satisfactory position. No evidence of active pulmonary disease. Electronically Signed   By: Lucienne Capers M.D.   On: 04/30/2016 23:15    DISCHARGE EXAMINATION: Filed Vitals:   05/13/16 1500 05/14/16 0017 05/14/16 0615 05/14/16 0914  BP: 131/67 128/59 134/60 153/61  Pulse: 62 66 64 59  Temp: 98.2 F (36.8 C) 98.7 F (37.1 C) 98 F (36.7 C) 98.4 F (36.9 C)  TempSrc: Oral Oral Oral Oral  Resp: '18 18 19 18  ' Height:      Weight:      SpO2: 99% 98% 100% 100%   General appearance: alert, cooperative, appears stated age and no distress Resp: clear to auscultation bilaterally Cardio: regular rate and rhythm, S1, S2 normal, no murmur, click, rub or gallop GI: soft, non-tender; bowel sounds normal; no masses,  no organomegaly Extremities: extremities normal, atraumatic, no cyanosis or edema  DISPOSITION: Home with home health  ALLERGIES:  Allergies  Allergen Reactions  . Adhesive [Tape] Other (See Comments)    Can burn skin if left on too long  . Celebrex [Celecoxib] Rash  . Detrol  [Tolterodine] Hives     Discharge Medication List as of 05/14/2016 10:36 AM    START taking these medications   Details  enoxaparin (LOVENOX) 60 MG/0.6ML injection Inject 0.6 mLs (60 mg total) into the skin every 12 (twelve) hours. Give until INR is 2.5., Starting 05/14/2016, Until Discontinued, Print      CONTINUE these medications which have CHANGED   Details  insulin detemir (LEVEMIR) 100 UNIT/ML injection Inject 0.1 mLs (10 Units total) into the skin daily., Starting 05/14/2016, Until Discontinued, Print    warfarin (COUMADIN) 7.5 MG tablet Take 1 tablet (7.5 mg total) by mouth daily at 6 PM., Starting 05/14/2016, Until Discontinued, Print      CONTINUE these medications which have NOT CHANGED   Details  Blood Glucose Monitoring Suppl (Carmi) w/Device KIT Use to check glucose 2x per day, Normal    citalopram (CELEXA) 40 MG tablet Take 1 tablet (40 mg total) by mouth daily., Starting 03/08/2016, Until Discontinued, Normal    glucose blood test strip Use as instructed, Normal    HYDROcodone-acetaminophen (NORCO/VICODIN) 5-325 MG tablet Take 1 tablet by mouth every 6 (six) hours as needed for moderate pain., Until Discontinued, Historical Med    insulin aspart (NOVOLOG FLEXPEN) 100 UNIT/ML FlexPen Inject 0-9 Units into the skin 3 (three) times daily with meals. CBG < 70: eat or drink something and recheck, CBG 70 - 120: 0 units CBG 121 - 150: 2 unit CBG 151 - 200: 3 units CBG 201 - 250: 5 units CBG 251 - 300:  6units CBG 301 - 350:  7units  CBG 351 - 400: 10 units CBG > 400: call MD., Starting 04/30/2016, Until Discontinued, Print    omeprazole (PRILOSEC) 40 MG capsule TAKE ONE CAPSULE BY MOUTH EVERY DAY, Normal    ondansetron (ZOFRAN) 4 MG tablet Take 4 mg by mouth every 4 (four) hours., Until Discontinued, Historical Med    pravastatin (PRAVACHOL) 20 MG tablet Take 20 mg by mouth daily., Starting 03/08/2016, Until Discontinued, Historical Med    traZODone  (DESYREL) 50 MG tablet Take 50 mg by mouth at bedtime. , Starting 12/17/2015, Until Discontinued, Historical Med       Follow-up Information    Follow up with Dorothyann Peng, NP. Schedule an appointment as soon as possible for a visit in 1 week.   Specialty:  Family  Medicine   Why:  post hospitalization follow up   Contact information:   [place] [address] [phone]       TOTAL DISCHARGE TIME: 2 minutes  [facility] Pager [phone]  05/14/2016, 11:30 AM

## 2016-05-15 ENCOUNTER — Other Ambulatory Visit: Payer: Self-pay

## 2016-05-15 ENCOUNTER — Ambulatory Visit: Payer: Commercial Managed Care - HMO | Admitting: Adult Health

## 2016-05-15 DIAGNOSIS — Z0289 Encounter for other administrative examinations: Secondary | ICD-10-CM

## 2016-05-15 LAB — CULTURE, BLOOD (ROUTINE X 2)
CULTURE: NO GROWTH
CULTURE: NO GROWTH

## 2016-05-16 ENCOUNTER — Ambulatory Visit: Payer: Self-pay

## 2016-05-16 ENCOUNTER — Other Ambulatory Visit: Payer: Self-pay

## 2016-05-16 ENCOUNTER — Telehealth: Payer: Self-pay | Admitting: Adult Health

## 2016-05-16 NOTE — Patient Outreach (Signed)
[facility]) Care Management  05/16/2016  KEYRA VIRELLA 08/29/1961 [id number]   Joint visit for initial home visit post acute care with visit with Danford Bad, LCSW and Nigel Mormon, [facility] RN.   Arlatisha from [place] from [place] called during this home visit.  Patient consented to include her mother and her sister in discussion, instructions. RNCM discussed concerns related to multiple admissions recently with diagnosis of DKA, patient's reports of having medications (insulins), however her glucose reports being very high indicating patient has not taken medications.  RNCM presented concerns that patient is alone most of the day, has deficits of ADLs and IADLs. patient receives In [facility] for 3 hours per day, 7 days per week. In Home Aide has been seen bringing patient strawberry cheese cake milkshakes from [employer].  Patient and family agreed to meet with [facility] Intake Nurse for program consideration.  During home visit, patient received call from Philomena Doheny, [facility] Case Manager [phone]) Patient asked this RNCM to talk to [facility] and update. Levon Hedger was told we were discussing the [facility] Program with patient who consented verbally to consult.   Call made to [facility], spoke with Elisa [phone]) to advise Elisa patient is now home and is interested in the [facility] Program.  Elisa stated she would call patient and family once she completes paperwork is currently working on.   Plan: Telephone contact later this month for follow up

## 2016-05-16 NOTE — Telephone Encounter (Signed)
[facility] called because pt has not seen home health or heard from anyone. Pt was discharged from the hospital on 6/26. [facility] has refused to take pt back, so will need a new home health referral.  Pt may also need OT to re teach on giving her insulin to self. Pt was was also sent home with Lovenox. (2 X day)  concerne is pt may not be taking this as well/

## 2016-05-16 NOTE — Addendum Note (Signed)
Addended by: Lavone Orn on: 05/16/2016 06:13 PM   Modules accepted: Medications

## 2016-05-17 ENCOUNTER — Inpatient Hospital Stay (HOSPITAL_COMMUNITY)
Admission: EM | Admit: 2016-05-17 | Discharge: 2016-05-24 | DRG: 637 | Disposition: A | Discharge status: Skilled Nursing Facility | Payer: Commercial Managed Care - HMO | Attending: Internal Medicine | Admitting: Internal Medicine

## 2016-05-17 ENCOUNTER — Other Ambulatory Visit: Payer: Self-pay | Admitting: *Deleted

## 2016-05-17 ENCOUNTER — Encounter (HOSPITAL_COMMUNITY): Payer: Self-pay | Admitting: Emergency Medicine

## 2016-05-17 ENCOUNTER — Emergency Department (HOSPITAL_COMMUNITY): Payer: Commercial Managed Care - HMO

## 2016-05-17 DIAGNOSIS — IMO0002 Reserved for concepts with insufficient information to code with codable children: Secondary | ICD-10-CM

## 2016-05-17 DIAGNOSIS — E860 Dehydration: Secondary | ICD-10-CM | POA: Diagnosis present

## 2016-05-17 DIAGNOSIS — Z794 Long term (current) use of insulin: Secondary | ICD-10-CM

## 2016-05-17 DIAGNOSIS — I110 Hypertensive heart disease with heart failure: Secondary | ICD-10-CM | POA: Diagnosis present

## 2016-05-17 DIAGNOSIS — R278 Other lack of coordination: Secondary | ICD-10-CM | POA: Diagnosis not present

## 2016-05-17 DIAGNOSIS — Z955 Presence of coronary angioplasty implant and graft: Secondary | ICD-10-CM

## 2016-05-17 DIAGNOSIS — Z888 Allergy status to other drugs, medicaments and biological substances status: Secondary | ICD-10-CM

## 2016-05-17 DIAGNOSIS — E1010 Type 1 diabetes mellitus with ketoacidosis without coma: Principal | ICD-10-CM | POA: Diagnosis present

## 2016-05-17 DIAGNOSIS — Z7901 Long term (current) use of anticoagulants: Secondary | ICD-10-CM

## 2016-05-17 DIAGNOSIS — Z5181 Encounter for therapeutic drug level monitoring: Secondary | ICD-10-CM | POA: Diagnosis not present

## 2016-05-17 DIAGNOSIS — E1065 Type 1 diabetes mellitus with hyperglycemia: Secondary | ICD-10-CM

## 2016-05-17 DIAGNOSIS — E108 Type 1 diabetes mellitus with unspecified complications: Secondary | ICD-10-CM

## 2016-05-17 DIAGNOSIS — N179 Acute kidney failure, unspecified: Secondary | ICD-10-CM | POA: Diagnosis not present

## 2016-05-17 DIAGNOSIS — I5032 Chronic diastolic (congestive) heart failure: Secondary | ICD-10-CM | POA: Diagnosis present

## 2016-05-17 DIAGNOSIS — I252 Old myocardial infarction: Secondary | ICD-10-CM

## 2016-05-17 DIAGNOSIS — I959 Hypotension, unspecified: Secondary | ICD-10-CM | POA: Diagnosis not present

## 2016-05-17 DIAGNOSIS — E861 Hypovolemia: Secondary | ICD-10-CM | POA: Diagnosis present

## 2016-05-17 DIAGNOSIS — I509 Heart failure, unspecified: Secondary | ICD-10-CM

## 2016-05-17 DIAGNOSIS — E1110 Type 2 diabetes mellitus with ketoacidosis without coma: Secondary | ICD-10-CM | POA: Insufficient documentation

## 2016-05-17 DIAGNOSIS — E872 Acidosis: Secondary | ICD-10-CM | POA: Diagnosis not present

## 2016-05-17 DIAGNOSIS — E1069 Type 1 diabetes mellitus with other specified complication: Secondary | ICD-10-CM | POA: Diagnosis not present

## 2016-05-17 DIAGNOSIS — F419 Anxiety disorder, unspecified: Secondary | ICD-10-CM | POA: Diagnosis present

## 2016-05-17 DIAGNOSIS — F331 Major depressive disorder, recurrent, moderate: Secondary | ICD-10-CM | POA: Diagnosis not present

## 2016-05-17 DIAGNOSIS — D649 Anemia, unspecified: Secondary | ICD-10-CM | POA: Diagnosis present

## 2016-05-17 DIAGNOSIS — J690 Pneumonitis due to inhalation of food and vomit: Secondary | ICD-10-CM | POA: Diagnosis present

## 2016-05-17 DIAGNOSIS — M069 Rheumatoid arthritis, unspecified: Secondary | ICD-10-CM | POA: Diagnosis present

## 2016-05-17 DIAGNOSIS — Z9114 Patient's other noncompliance with medication regimen: Secondary | ICD-10-CM

## 2016-05-17 DIAGNOSIS — F329 Major depressive disorder, single episode, unspecified: Secondary | ICD-10-CM | POA: Diagnosis present

## 2016-05-17 DIAGNOSIS — Z91048 Other nonmedicinal substance allergy status: Secondary | ICD-10-CM | POA: Diagnosis not present

## 2016-05-17 DIAGNOSIS — I5022 Chronic systolic (congestive) heart failure: Secondary | ICD-10-CM | POA: Diagnosis not present

## 2016-05-17 DIAGNOSIS — F4323 Adjustment disorder with mixed anxiety and depressed mood: Secondary | ICD-10-CM | POA: Diagnosis not present

## 2016-05-17 DIAGNOSIS — E1310 Other specified diabetes mellitus with ketoacidosis without coma: Secondary | ICD-10-CM

## 2016-05-17 DIAGNOSIS — I69320 Aphasia following cerebral infarction: Secondary | ICD-10-CM | POA: Diagnosis not present

## 2016-05-17 DIAGNOSIS — E785 Hyperlipidemia, unspecified: Secondary | ICD-10-CM | POA: Diagnosis not present

## 2016-05-17 DIAGNOSIS — Z952 Presence of prosthetic heart valve: Secondary | ICD-10-CM | POA: Diagnosis not present

## 2016-05-17 DIAGNOSIS — Z951 Presence of aortocoronary bypass graft: Secondary | ICD-10-CM

## 2016-05-17 DIAGNOSIS — J9811 Atelectasis: Secondary | ICD-10-CM

## 2016-05-17 DIAGNOSIS — I2510 Atherosclerotic heart disease of native coronary artery without angina pectoris: Secondary | ICD-10-CM | POA: Diagnosis not present

## 2016-05-17 DIAGNOSIS — J90 Pleural effusion, not elsewhere classified: Secondary | ICD-10-CM | POA: Diagnosis not present

## 2016-05-17 DIAGNOSIS — K219 Gastro-esophageal reflux disease without esophagitis: Secondary | ICD-10-CM | POA: Diagnosis not present

## 2016-05-17 DIAGNOSIS — R7309 Other abnormal glucose: Secondary | ICD-10-CM | POA: Diagnosis not present

## 2016-05-17 DIAGNOSIS — M6281 Muscle weakness (generalized): Secondary | ICD-10-CM | POA: Diagnosis not present

## 2016-05-17 DIAGNOSIS — I5042 Chronic combined systolic (congestive) and diastolic (congestive) heart failure: Secondary | ICD-10-CM | POA: Diagnosis not present

## 2016-05-17 DIAGNOSIS — J9601 Acute respiratory failure with hypoxia: Secondary | ICD-10-CM | POA: Diagnosis present

## 2016-05-17 DIAGNOSIS — I10 Essential (primary) hypertension: Secondary | ICD-10-CM | POA: Diagnosis not present

## 2016-05-17 DIAGNOSIS — R112 Nausea with vomiting, unspecified: Secondary | ICD-10-CM | POA: Diagnosis not present

## 2016-05-17 DIAGNOSIS — Z954 Presence of other heart-valve replacement: Secondary | ICD-10-CM | POA: Diagnosis not present

## 2016-05-17 DIAGNOSIS — R2689 Other abnormalities of gait and mobility: Secondary | ICD-10-CM | POA: Diagnosis not present

## 2016-05-17 LAB — URINALYSIS, ROUTINE W REFLEX MICROSCOPIC
BILIRUBIN URINE: NEGATIVE
HGB URINE DIPSTICK: NEGATIVE
Leukocytes, UA: NEGATIVE
Nitrite: NEGATIVE
PROTEIN: NEGATIVE mg/dL
Specific Gravity, Urine: 1.025 (ref 1.005–1.030)
pH: 5 (ref 5.0–8.0)

## 2016-05-17 LAB — GLUCOSE, CAPILLARY
GLUCOSE-CAPILLARY: 141 mg/dL — AB (ref 65–99)
GLUCOSE-CAPILLARY: 117 mg/dL — AB (ref 65–99)
GLUCOSE-CAPILLARY: 233 mg/dL — AB (ref 65–99)
GLUCOSE-CAPILLARY: 218 mg/dL — AB (ref 65–99)
GLUCOSE-CAPILLARY: 109 mg/dL — AB (ref 65–99)
Glucose-Capillary: 118 mg/dL — ABNORMAL HIGH (ref 65–99)
Glucose-Capillary: 108 mg/dL — ABNORMAL HIGH (ref 65–99)
Glucose-Capillary: 112 mg/dL — ABNORMAL HIGH (ref 65–99)
Glucose-Capillary: 125 mg/dL — ABNORMAL HIGH (ref 65–99)

## 2016-05-17 LAB — CBC WITH DIFFERENTIAL/PLATELET
BASOS ABS: 0 10*3/uL (ref 0.0–0.1)
BASOS PCT: 0 %
EOS ABS: 0 10*3/uL (ref 0.0–0.7)
Eosinophils Relative: 0 %
HCT: 30.4 % — ABNORMAL LOW (ref 36.0–46.0)
Hemoglobin: 9.3 g/dL — ABNORMAL LOW (ref 12.0–15.0)
LYMPHS PCT: 12 %
Lymphs Abs: 2.2 10*3/uL (ref 0.7–4.0)
MCH: 28.6 pg (ref 26.0–34.0)
MCHC: 30.6 g/dL (ref 30.0–36.0)
MCV: 93.5 fL (ref 78.0–100.0)
MONO ABS: 0.9 10*3/uL (ref 0.1–1.0)
Monocytes Relative: 5 %
NEUTROS ABS: 14.9 10*3/uL — AB (ref 1.7–7.7)
NEUTROS PCT: 83 %
PLATELETS: 369 10*3/uL (ref 150–400)
RBC: 3.25 MIL/uL — ABNORMAL LOW (ref 3.87–5.11)
RDW: 17.3 % — AB (ref 11.5–15.5)
WBC: 18 10*3/uL — ABNORMAL HIGH (ref 4.0–10.5)

## 2016-05-17 LAB — PROTIME-INR
INR: 1.17 (ref 0.00–1.49)
Prothrombin Time: 15 seconds (ref 11.6–15.2)

## 2016-05-17 LAB — I-STAT ARTERIAL BLOOD GAS, ED
Acid-base deficit: 19 mmol/L — ABNORMAL HIGH (ref 0.0–2.0)
Bicarbonate: 7.7 mEq/L — ABNORMAL LOW (ref 20.0–24.0)
O2 Saturation: 63 %
PCO2 ART: 22.1 mmHg — AB (ref 35.0–45.0)
PO2 ART: 41 mmHg — AB (ref 80.0–100.0)
Patient temperature: 98.6
TCO2: 8 mmol/L (ref 0–100)
pH, Arterial: 7.149 — CL (ref 7.350–7.450)

## 2016-05-17 LAB — I-STAT CHEM 8, ED
BUN: 13 mg/dL (ref 6–20)
CALCIUM ION: 1.12 mmol/L — AB (ref 1.13–1.30)
Chloride: 97 mmol/L — ABNORMAL LOW (ref 101–111)
Creatinine, Ser: 0.7 mg/dL (ref 0.44–1.00)
Glucose, Bld: >700 mg/dL (ref 65–99)
HEMATOCRIT: 32 % — AB (ref 36.0–46.0)
Hemoglobin: 10.9 g/dL — ABNORMAL LOW (ref 12.0–15.0)
Potassium: 5.5 mmol/L — ABNORMAL HIGH (ref 3.5–5.1)
SODIUM: 124 mmol/L — AB (ref 135–145)
TCO2: 7 mmol/L (ref 0–100)

## 2016-05-17 LAB — COMPREHENSIVE METABOLIC PANEL
ALK PHOS: 95 U/L (ref 38–126)
ALT: 16 U/L (ref 14–54)
AST: 21 U/L (ref 15–41)
Albumin: 2.7 g/dL — ABNORMAL LOW (ref 3.5–5.0)
BILIRUBIN TOTAL: 1.6 mg/dL — AB (ref 0.3–1.2)
BUN: 14 mg/dL (ref 6–20)
CALCIUM: 8.9 mg/dL (ref 8.9–10.3)
CO2: <7 mmol/L — ABNORMAL LOW (ref 22–32)
CREATININE: 1.65 mg/dL — AB (ref 0.44–1.00)
Chloride: 93 mmol/L — ABNORMAL LOW (ref 101–111)
GFR calc non Af Amer: 34 mL/min — ABNORMAL LOW (ref >60)
GFR, EST AFRICAN AMERICAN: 39 mL/min — AB (ref >60)
GLUCOSE: 764 mg/dL — AB (ref 65–99)
Potassium: 5.6 mmol/L — ABNORMAL HIGH (ref 3.5–5.1)
Sodium: 127 mmol/L — ABNORMAL LOW (ref 135–145)
TOTAL PROTEIN: 5.6 g/dL — AB (ref 6.5–8.1)

## 2016-05-17 LAB — MAGNESIUM: Magnesium: 1.5 mg/dL — ABNORMAL LOW (ref 1.7–2.4)

## 2016-05-17 LAB — URINE MICROSCOPIC-ADD ON

## 2016-05-17 LAB — BASIC METABOLIC PANEL
Anion gap: 9 (ref 5–15)
BUN: 12 mg/dL (ref 6–20)
BUN: 5 mg/dL — AB (ref 6–20)
CO2: <7 mmol/L — ABNORMAL LOW (ref 22–32)
CO2: 13 mmol/L — ABNORMAL LOW (ref 22–32)
CREATININE: 1.52 mg/dL — AB (ref 0.44–1.00)
Calcium: 8.1 mg/dL — ABNORMAL LOW (ref 8.9–10.3)
Calcium: 7.6 mg/dL — ABNORMAL LOW (ref 8.9–10.3)
Chloride: 102 mmol/L (ref 101–111)
Chloride: 109 mmol/L (ref 101–111)
Creatinine, Ser: 0.84 mg/dL (ref 0.44–1.00)
GFR calc Af Amer: 43 mL/min — ABNORMAL LOW (ref >60)
GFR calc Af Amer: >60 mL/min (ref >60)
GFR, EST NON AFRICAN AMERICAN: 38 mL/min — AB (ref >60)
GLUCOSE: 530 mg/dL — AB (ref 65–99)
GLUCOSE: 306 mg/dL — AB (ref 65–99)
POTASSIUM: 4.6 mmol/L (ref 3.5–5.1)
POTASSIUM: 4.2 mmol/L (ref 3.5–5.1)
Sodium: 130 mmol/L — ABNORMAL LOW (ref 135–145)
Sodium: 131 mmol/L — ABNORMAL LOW (ref 135–145)

## 2016-05-17 LAB — CBG MONITORING, ED
GLUCOSE-CAPILLARY: 551 mg/dL — AB (ref 65–99)
GLUCOSE-CAPILLARY: 351 mg/dL — AB (ref 65–99)
GLUCOSE-CAPILLARY: 245 mg/dL — AB (ref 65–99)
GLUCOSE-CAPILLARY: 208 mg/dL — AB (ref 65–99)
Glucose-Capillary: 416 mg/dL — ABNORMAL HIGH (ref 65–99)

## 2016-05-17 LAB — POCT I-STAT 3, ART BLOOD GAS (G3+)
Acid-base deficit: 9 mmol/L — ABNORMAL HIGH (ref 0.0–2.0)
BICARBONATE: 14.9 meq/L — AB (ref 20.0–24.0)
O2 Saturation: 98 %
PCO2 ART: 24.2 mmHg — AB (ref 35.0–45.0)
PO2 ART: 110 mmHg — AB (ref 80.0–100.0)
Patient temperature: 98.4
TCO2: 16 mmol/L (ref 0–100)
pH, Arterial: 7.395 (ref 7.350–7.450)

## 2016-05-17 LAB — HEPARIN LEVEL (UNFRACTIONATED): Heparin Unfractionated: 0.53 IU/mL (ref 0.30–0.70)

## 2016-05-17 LAB — RAPID URINE DRUG SCREEN, HOSP PERFORMED
AMPHETAMINES: NOT DETECTED
Barbiturates: NOT DETECTED
Benzodiazepines: NOT DETECTED
Cocaine: NOT DETECTED
Opiates: NOT DETECTED
TETRAHYDROCANNABINOL: NOT DETECTED

## 2016-05-17 LAB — PROCALCITONIN: Procalcitonin: 0.68 ng/mL

## 2016-05-17 LAB — LACTIC ACID, PLASMA
LACTIC ACID, VENOUS: 4.5 mmol/L — AB (ref 0.5–1.9)
LACTIC ACID, VENOUS: 0.9 mmol/L (ref 0.5–1.9)

## 2016-05-17 LAB — HIV ANTIBODY (ROUTINE TESTING W REFLEX): HIV SCREEN 4TH GENERATION: NONREACTIVE

## 2016-05-17 LAB — TROPONIN I: Troponin I: <0.03 ng/mL (ref <0.03)

## 2016-05-17 LAB — STREP PNEUMONIAE URINARY ANTIGEN: STREP PNEUMO URINARY ANTIGEN: NEGATIVE

## 2016-05-17 LAB — I-STAT CG4 LACTIC ACID, ED: Lactic Acid, Venous: 6.5 mmol/L (ref 0.5–1.9)

## 2016-05-17 LAB — PHOSPHORUS: Phosphorus: 1.9 mg/dL — ABNORMAL LOW (ref 2.5–4.6)

## 2016-05-17 MED ORDER — SODIUM CHLORIDE 0.9 % IV BOLUS (SEPSIS): 1000.0000 mL | Freq: Once | INTRAVENOUS | Status: DC

## 2016-05-17 MED ORDER — PHENYLEPHRINE HCL 10 MG/ML IJ SOLN
0.0000 ug/min | INTRAMUSCULAR | Status: DC
  Administered 2016-05-17: 20 ug/min via INTRAVENOUS
  Filled 2016-05-17 (×3): qty 1

## 2016-05-17 MED ORDER — SODIUM CHLORIDE 0.9 % IV BOLUS (SEPSIS)
2000.0000 mL | Freq: Once | INTRAVENOUS | Status: AC
  Administered 2016-05-17: 2000 mL via INTRAVENOUS

## 2016-05-17 MED ORDER — VANCOMYCIN HCL 10 G IV SOLR
1500.0000 mg | Freq: Once | INTRAVENOUS | Status: AC
  Administered 2016-05-17: 1500 mg via INTRAVENOUS
  Filled 2016-05-17: qty 1500

## 2016-05-17 MED ORDER — HEPARIN BOLUS VIA INFUSION
4000.0000 [IU] | Freq: Once | INTRAVENOUS | Status: AC
  Administered 2016-05-17: 4000 [IU] via INTRAVENOUS
  Filled 2016-05-17: qty 4000

## 2016-05-17 MED ORDER — SODIUM CHLORIDE 0.9 % IV BOLUS (SEPSIS)
1000.0000 mL | Freq: Once | INTRAVENOUS | Status: AC
  Administered 2016-05-17: 1000 mL via INTRAVENOUS

## 2016-05-17 MED ORDER — DEXTROSE-NACL 5-0.45 % IV SOLN
INTRAVENOUS | Status: DC
  Administered 2016-05-17: 13:00:00 via INTRAVENOUS

## 2016-05-17 MED ORDER — INSULIN GLARGINE 100 UNIT/ML ~~LOC~~ SOLN
10.0000 [IU] | Freq: Every day | SUBCUTANEOUS | Status: DC
  Administered 2016-05-17 – 2016-05-18 (×2): 10 [IU] via SUBCUTANEOUS
  Filled 2016-05-17 (×4): qty 0.1

## 2016-05-17 MED ORDER — VANCOMYCIN HCL IN DEXTROSE 1-5 GM/200ML-% IV SOLN
1000.0000 mg | INTRAVENOUS | Status: DC
  Administered 2016-05-18: 1000 mg via INTRAVENOUS
  Filled 2016-05-17: qty 200

## 2016-05-17 MED ORDER — FAMOTIDINE IN NACL 20-0.9 MG/50ML-% IV SOLN
20.0000 mg | Freq: Two times a day (BID) | INTRAVENOUS | Status: DC
  Administered 2016-05-17 – 2016-05-18 (×2): 20 mg via INTRAVENOUS
  Filled 2016-05-17 (×4): qty 50

## 2016-05-17 MED ORDER — DEXTROSE-NACL 5-0.45 % IV SOLN
INTRAVENOUS | Status: DC
Start: 2016-05-17 — End: 2016-05-17

## 2016-05-17 MED ORDER — VANCOMYCIN HCL IN DEXTROSE 750-5 MG/150ML-% IV SOLN
750.0000 mg | Freq: Two times a day (BID) | INTRAVENOUS | Status: DC
  Filled 2016-05-17: qty 150

## 2016-05-17 MED ORDER — MAGNESIUM SULFATE 50 % IJ SOLN
4.0000 g | Freq: Once | INTRAVENOUS | Status: AC
  Administered 2016-05-17: 4 g via INTRAVENOUS
  Filled 2016-05-17 (×3): qty 8

## 2016-05-17 MED ORDER — SODIUM CHLORIDE 0.9 % IV SOLN: INTRAVENOUS | Status: DC

## 2016-05-17 MED ORDER — ONDANSETRON HCL 4 MG/2ML IJ SOLN: 4.0000 mg | Freq: Four times a day (QID) | INTRAMUSCULAR | Status: DC | PRN

## 2016-05-17 MED ORDER — INSULIN ASPART 100 UNIT/ML ~~LOC~~ SOLN
10.0000 [IU] | Freq: Once | SUBCUTANEOUS | Status: AC
  Administered 2016-05-17: 10 [IU] via INTRAVENOUS

## 2016-05-17 MED ORDER — SODIUM PHOSPHATES 45 MMOLE/15ML IV SOLN
20.0000 mmol | Freq: Once | INTRAVENOUS | Status: AC
  Administered 2016-05-17: 20 mmol via INTRAVENOUS
  Filled 2016-05-17: qty 6.67

## 2016-05-17 MED ORDER — INSULIN ASPART 100 UNIT/ML ~~LOC~~ SOLN
0.0000 [IU] | Freq: Three times a day (TID) | SUBCUTANEOUS | Status: DC
  Administered 2016-05-18 (×2): 3 [IU] via SUBCUTANEOUS

## 2016-05-17 MED ORDER — POTASSIUM CHLORIDE 10 MEQ/100ML IV SOLN
INTRAVENOUS | Status: AC
  Filled 2016-05-17: qty 100

## 2016-05-17 MED ORDER — SODIUM CHLORIDE 0.9 % IV SOLN
INTRAVENOUS | Status: DC
  Administered 2016-05-17 – 2016-05-19 (×3): via INTRAVENOUS

## 2016-05-17 MED ORDER — SODIUM CHLORIDE 0.9 % IV SOLN: Freq: Once | INTRAVENOUS | Status: DC

## 2016-05-17 MED ORDER — DEXTROSE 5 % IV SOLN: 2.0000 g | INTRAVENOUS | Status: DC

## 2016-05-17 MED ORDER — INSULIN REGULAR HUMAN 100 UNIT/ML IJ SOLN
INTRAMUSCULAR | Status: DC
  Administered 2016-05-17: 5.4 [IU]/h via INTRAVENOUS
  Filled 2016-05-17: qty 2.5

## 2016-05-17 MED ORDER — VANCOMYCIN HCL IN DEXTROSE 1-5 GM/200ML-% IV SOLN: 1000.0000 mg | INTRAVENOUS | Status: DC

## 2016-05-17 MED ORDER — PRAVASTATIN SODIUM 20 MG PO TABS
20.0000 mg | ORAL_TABLET | Freq: Every day | ORAL | Status: DC
  Administered 2016-05-17 – 2016-05-24 (×8): 20 mg via ORAL
  Filled 2016-05-17 (×5): qty 1
  Filled 2016-05-17 (×2): qty 0.5
  Filled 2016-05-17: qty 1
  Filled 2016-05-17: qty 0.5

## 2016-05-17 MED ORDER — DEXTROSE 5 % IV SOLN
2.0000 g | Freq: Once | INTRAVENOUS | Status: AC
  Administered 2016-05-17: 2 g via INTRAVENOUS
  Filled 2016-05-17: qty 2

## 2016-05-17 MED ORDER — CITALOPRAM HYDROBROMIDE 20 MG PO TABS
40.0000 mg | ORAL_TABLET | Freq: Every day | ORAL | Status: DC
  Administered 2016-05-17 – 2016-05-24 (×8): 40 mg via ORAL
  Filled 2016-05-17 (×5): qty 2
  Filled 2016-05-17 (×2): qty 1
  Filled 2016-05-17: qty 2

## 2016-05-17 MED ORDER — DEXTROSE 5 % IV SOLN
1.0000 g | Freq: Three times a day (TID) | INTRAVENOUS | Status: DC
  Administered 2016-05-17: 1 g via INTRAVENOUS
  Filled 2016-05-17 (×2): qty 1

## 2016-05-17 MED ORDER — POTASSIUM CHLORIDE 10 MEQ/100ML IV SOLN
10.0000 meq | INTRAVENOUS | Status: AC
  Administered 2016-05-17 (×3): 10 meq via INTRAVENOUS

## 2016-05-17 MED ORDER — HEPARIN (PORCINE) IN NACL 100-0.45 UNIT/ML-% IJ SOLN
950.0000 [IU]/h | INTRAMUSCULAR | Status: DC
  Administered 2016-05-17 – 2016-05-20 (×4): 850 [IU]/h via INTRAVENOUS
  Administered 2016-05-21: 950 [IU]/h via INTRAVENOUS
  Filled 2016-05-17 (×6): qty 250

## 2016-05-17 MED ORDER — POTASSIUM CHLORIDE 10 MEQ/100ML IV SOLN
10.0000 meq | INTRAVENOUS | Status: AC
  Administered 2016-05-17: 10 meq via INTRAVENOUS
  Filled 2016-05-17: qty 100

## 2016-05-17 MED ORDER — DEXTROSE 5 % IV SOLN
2.0000 g | INTRAVENOUS | Status: DC
  Administered 2016-05-18: 2 g via INTRAVENOUS
  Filled 2016-05-17: qty 2

## 2016-05-17 MED ORDER — INSULIN ASPART 100 UNIT/ML ~~LOC~~ SOLN
0.0000 [IU] | Freq: Every day | SUBCUTANEOUS | Status: DC
  Administered 2016-05-21: 4 [IU] via SUBCUTANEOUS
  Administered 2016-05-22: 2 [IU] via SUBCUTANEOUS

## 2016-05-17 MED ORDER — INSULIN ASPART 100 UNIT/ML IV SOLN
10.0000 [IU] | Freq: Once | INTRAVENOUS | Status: DC
  Filled 2016-05-17: qty 1

## 2016-05-17 NOTE — H&P (Signed)
PULMONARY / CRITICAL CARE MEDICINE   Name: Madison [name] MRN: [id number] DOB: 09-03-1961    ADMISSION DATE:  05/17/2016 CONSULTATION DATE:  05/17/2016  REFERRING MD:  Dr. Broadus John  CHIEF COMPLAINT:  Hyperglycemia and Hypotension  HISTORY OF PRESENT ILLNESS:   This is a 55 yo female with frequent admissions secondary to DKA with PMH of Type I DM, Mechanic aortic valve replacement (takes coumadin), CHF, CAD, Obesity, Hypercholesteremia, HTN, Dyslipidemia, Stroke syndrome, GERD, Anxiety, Depression, Rheumatoid arthritis, Aortic Stenosis, Thrombophlebitis, Heart Murmur, MI, and Pneumonia.  She presented to [facility] ER on 05/17/2016 with c/o hyperglycemia, onset 2 days ago blood sugars ranging between 400 to 500's.  Associated symptoms include nausea and vomiting. She states she has been taking her insulin as prescribed 5 units of Novolog ac/hs and 10 units of Levimir daily in the am.  She states she became dizzy last night and fell at home denies injury post fall.  She has had increased bilateral lower extremity swelling onset 2 days ago unsure if she has had weight gain.  She denies shortness of breath, fever, chills, diarrhea, or sick contacts.  PCCM consulted 6/29 due to Type I DKA and hypotension may require pressors.  PAST MEDICAL HISTORY :  She  has a past medical history of CAD (coronary artery disease); Obesity; Hypercholesteremia; HTN (hypertension); Dyslipidemia; Aortic stenosis; Thrombophlebitis; Heart murmur; Myocardial infarction ([place]) (12/2014); Pneumonia (11/2014; 12/2014); GERD (gastroesophageal reflux disease); Stroke syndrome ([place]) (1995; 2001); Anxiety; Depression; Allergy; CHF (congestive heart failure) ([place]); Rheumatoid arthritis(714.0); and Diabetes ([place]) (dx'd 1982).  PAST SURGICAL HISTORY: She  has past surgical history that includes Cesarean section (1995); Foot fracture surgery; Knee arthroscopy (Right); Neuroplasty / transposition median nerve at carpal tunnel; left heart  catheterization with coronary angiogram (N/A, 12/08/2014); percutaneous coronary stent intervention (pci-s) (N/A, 12/09/2014); Cardiac catheterization (12/08/2014); Coronary angioplasty with stent (12/09/2014); Aortic valve replacement (avr)/coronary artery bypass grafting (cabg) ("2014"); Fracture surgery; Tubal ligation (1995); Cataract extraction (Left); Coronary artery bypass graft; and ORIF ankle fracture (Left, 07/04/2015).  Allergies  Allergen Reactions  . Adhesive [Tape] Other (See Comments)    Can burn skin if left on too long  . Celebrex [Celecoxib] Rash  . Detrol [Tolterodine] Hives    No current facility-administered medications on file prior to encounter.   Current Outpatient Prescriptions on File Prior to Encounter  Medication Sig  . Blood Glucose Monitoring Suppl ([place]) w/Device KIT Use to check glucose 2x per day  . citalopram (CELEXA) 40 MG tablet Take 1 tablet (40 mg total) by mouth daily.  Marland Kitchen enoxaparin (LOVENOX) 60 MG/0.6ML injection Inject 0.6 mLs (60 mg total) into the skin every 12 (twelve) hours. Give until INR is 2.5. (Patient not taking: Reported on 05/16/2016)  . glucose blood test strip Use as instructed (Patient taking differently: 1 each by Other route as needed (as instructed). Use as instructed)  . HYDROcodone-acetaminophen (NORCO/VICODIN) 5-325 MG tablet Take 1 tablet by mouth every 6 (six) hours as needed for moderate pain. Reported on 05/16/2016  . insulin aspart (NOVOLOG FLEXPEN) 100 UNIT/ML FlexPen Inject 0-9 Units into the skin 3 (three) times daily with meals. CBG < 70: eat or drink something and recheck, CBG 70 - 120: 0 units CBG 121 - 150: 2 unit CBG 151 - 200: 3 units CBG 201 - 250: 5 units CBG 251 - 300:  6units CBG 301 - 350:  7units CBG 351 - 400: 10 units CBG > 400: call MD. (Patient not taking: Reported on  05/16/2016)  . insulin detemir (LEVEMIR) 100 UNIT/ML injection Inject 0.1 mLs (10 Units total) into the skin daily. (Patient not  taking: Reported on 05/16/2016)  . omeprazole (PRILOSEC) 40 MG capsule TAKE ONE CAPSULE BY MOUTH EVERY DAY  . ondansetron (ZOFRAN) 4 MG tablet Take 4 mg by mouth every 4 (four) hours. Reported on 05/16/2016  . pravastatin (PRAVACHOL) 20 MG tablet Take 20 mg by mouth daily.  . traZODone (DESYREL) 50 MG tablet Take 50 mg by mouth at bedtime. Reported on 05/16/2016  . warfarin (COUMADIN) 7.5 MG tablet Take 1 tablet (7.5 mg total) by mouth daily at 6 PM.    FAMILY HISTORY:  Her indicated that her mother is alive. She indicated that her father is deceased. She indicated that all of her five sisters are alive. She indicated that both of her brothers are alive.   SOCIAL HISTORY: She  reports that she has never smoked. She has never used smokeless tobacco. She reports that she does not drink alcohol or use illicit drugs.  REVIEW OF SYSTEMS:  Positives in BOLD Gen: Denies fever, chills, weight change, fatigue, night sweats HEENT: Denies blurred vision, double vision, hearing loss, tinnitus, sinus congestion, rhinorrhea, sore throat, neck stiffness, dysphagia PULM: Denies shortness of breath, cough, sputum production, hemoptysis, wheezing CV: Denies chest pain, edema, orthopnea, paroxysmal nocturnal dyspnea, palpitations GI: Denies abdominal pain, nausea, vomiting, diarrhea, hematochezia, melena, constipation, change in bowel habits GU: Denies dysuria, hematuria, polyuria, oliguria, urethral discharge Endocrine: Denies hot or cold intolerance, polyuria, polyphagia or appetite change Derm: Denies rash, dry skin, scaling or peeling skin change Heme: Denies easy bruising, bleeding, bleeding gums Neuro: Denies headache, numbness, weakness, slurred speech, loss of memory or consciousness  SUBJECTIVE:  Acutely ill appearing female states her blood sugars have been elevated denies acute distress at this time.  Currently on RA with O2 sats at 100%.  Receiving NS boluses due to hypotension map ranging between  50-60.    VITAL SIGNS: BP 90/35 mmHg  Pulse 100  Temp(Src) 98.4 F (36.9 C) (Rectal)  Resp 27  Wt 120 lb (54.432 kg)  SpO2 100%  HEMODYNAMICS:    VENTILATOR SETTINGS:    INTAKE / OUTPUT:    PHYSICAL EXAMINATION: General: acutely ill appearing female Neuro:  Alert and oriented follows commands HEENT:  Supple, no JVD Cardiovascular: s1s2, rrr, no M/R/G, 2+ bilateral lower extremity edema Lungs:  Faint crackles bilateral bases, clear all other lobes, even, non labored Abdomen:  +BS x4, soft, non tender, non distended Musculoskeletal:  Left arm hemiparesis, moves all other extremities Skin:  Scabbed over abrasion right shin, no rash  LABS:  BMET  Recent Labs Lab 05/14/16 0645 05/17/16 0340 05/17/16 0343 05/17/16 0627  NA 138 127* 124* 130*  K 4.0 5.6* 5.5* 4.6  CL 109 93* 97* 102  CO2 23 <7*  --  <7*  BUN <5* _0 CREATININE 0.53 1.65* 0.70 1.52*  GLUCOSE 142* 764* >700* 530*    Electrolytes  Recent Labs Lab 05/12/16 0500 05/13/16 0336 05/14/16 0645 05/17/16 0340 05/17/16 0627  CALCIUM 7.5* 7.6* 8.0* 8.9 8.1*  MG 1.6* 2.1  --   --   --   PHOS 1.6*  --   --   --   --     CBC  Recent Labs Lab 05/13/16 0336 05/14/16 0645 05/17/16 0340 05/17/16 0343  WBC 6.2 4.1 18.0*  --   HGB 8.8* 8.7* 9.3* 10.9*  HCT 26.2* 26.3* 30.4* 32.0*  PLT  205 182 369  --     Coag's  Recent Labs Lab 05/10/16 1958  05/13/16 0336 05/14/16 0645 05/17/16 0627  APTT 140*  --   --   --   --   INR  --   < > 1.24 1.63* 1.17  < > = values in this interval not displayed.  Sepsis Markers  Recent Labs Lab 05/10/16 1958  05/11/16 1348 05/17/16 0343 05/17/16 0627  LATICACIDVEN 1.9  < > 0.9 6.50* 4.5*  PROCALCITON 5.13  --   --   --  0.68  < > = values in this interval not displayed.  ABG  Recent Labs Lab 05/17/16 0802  PHART 7.149*  PCO2ART 22.1*  PO2ART 41.0*    Liver Enzymes  Recent Labs Lab 05/12/16 0500 05/13/16 0336 05/17/16 0340  AST   --  34 21  ALT  --  17 16  ALKPHOS  --  75 95  BILITOT  --  0.7 1.6*  ALBUMIN 1.8* 2.1* 2.7*    Cardiac Enzymes  Recent Labs Lab 05/11/16 1348 05/11/16 2200 05/17/16 0627  TROPONINI 0.33* 0.25* <0.03    Glucose  Recent Labs Lab 05/17/16 0319 05/17/16 0436 05/17/16 0508 05/17/16 0618 05/17/16 0721 05/17/16 0813  GLUCAP >600* >600* >600* 551* 416* 351*    Imaging Dg Chest Port 1 View  05/17/2016  CLINICAL DATA:  Hyperglycemia and hypotension EXAM: PORTABLE CHEST 1 VIEW COMPARISON:  05/10/2016 FINDINGS: Mild airspace opacity in the right upper lobe may represent early infectious infiltrate or aspiration. The left lung is clear. There is no large effusion. The pulmonary vasculature is normal. IMPRESSION: Question early infectious infiltrate or aspiration in the right upper lobe. Electronically Signed   By: Andreas Newport M.D.   On: 05/17/2016 03:36     STUDIES:  Echo 6/14>> EF 60% to 65%  CULTURES: Urine 6/29>> Blood x2 6/29>>  ANTIBIOTICS: Cefepime 6/29>> Vancomycin 6/29>>  SIGNIFICANT EVENTS: -Pt presented to [facility] 6/29 with c/o hyperglycemia diagnosed Type 1 DKA  -PCCM consulted 6/29 due to DKA and hypotension pt admitted to ICU  LINES/TUBES: PIV's>>  DISCUSSION: This is a 55 yo female with frequent admissions due to DKA she presented to [facility] ER on 6/29 due to hyperglycemia and hypotension was diagnosed with DKA and ?Pneumonia.  PCCM consulted 6/29  ASSESSMENT / PLAN:  PULMONARY A: Metabolic acidosis secondary to DKA P:   Supplemental O2 as needed to maintain O2 sats 92% or above Repeat ABG's  CARDIOVASCULAR A:  Shock secondary to hypovolemia vs. Sepsis Hx: CAD, MI, Heart murmur, CHF, HTN, Hypercholesteremia, Aortic stenosis, Stroke syndrome, Dyslipidemia P:  Fluid resuscitation-receiving 4L NS bolus for hypotension Maintenance IV fluids NS 100 ml/hr Hold Coumadin for now Heparin Drip per pharmacy protocol Trend troponin's  q6hrs Continue outpatient pravachol Neo-synephrine to maintain map >65 Will hold off on central line for now however if pt remains hypotensive will place central line  RENAL A:   Acute renal failure P:   Maintenance IV fluids as listed above Monitor UOP Trend BMP's Replace electrolytes as indicated  GASTROINTESTINAL A:   Nausea Hx: GERD  P:   Prn zofran Keep NPO for now Start Pepcid  HEMATOLOGIC A:   Anemia P:  Trend CBC Monitor for s/sx of bleeding Transfuse per ICU protocol  INFECTIOUS A:   Leukocytosis  CXR-early signs of infiltrates ?apiration pneumonia vs. HCAP P:   Continue antibiotics as listed above Follow Cultures Trend PCT's and lactic acids Trend WBC and monitor  fever curve  ENDOCRINE A:   Type I DKA Hx: Type I DM P:   Insulin drip CBG's q1hr while on insulin drip BMP's q4 hrs while on insulin drip and until anion gap closed NS at 100 ml/hr transition to D51/2NS once CBG <250 mg/dL  NEUROLOGIC A:   No acute issues Hx: Stroke syndrome with aphasia, anxiety, and depression P:   Continue outpatient Celexa Hold outpatient Trazodone for now Monitor mentation    FAMILY  - Updates: Pt updated about plan of care and questions answered  - Inter-disciplinary family meet or Palliative Care meeting due by:  05/25/2016    Marda Stalker, [place]

## 2016-05-17 NOTE — ED Provider Notes (Signed)
CSN: [id number]     Arrival date & time    History  By signing my name below, I, [name], attest that this documentation has been prepared under the direction and in the presence of [name], MD. Electronically Signed: [name], ED Scribe. 05/17/2016. 3:15 AM     Chief Complaint  Patient presents with  . Hyperglycemia  . Hypotension   The history is provided by the patient. No language interpreter was used.   Brought in by EMS, Madison [name] is a 55 y.o. female with PMHx of DM, HTN, MI, CHF, and stroke who presents to the Emergency Department complaining of hyperglycemia, last blood sugar 575. She has felt ill for the last 2 days.  Associated symptoms include nausea and vomiting. She was noted to by hypotensive by EMS. Pt denies fever, cough,  diarrhea, and dysuria.   Past Medical History  Diagnosis Date  . CAD (coronary artery disease)   . Obesity   . Hypercholesteremia   . HTN (hypertension)   . Dyslipidemia   . Aortic stenosis   . Thrombophlebitis   . Heart murmur   . Myocardial infarction ([place]) 12/2014  . Pneumonia 11/2014; 12/2014  . GERD (gastroesophageal reflux disease)   . Stroke syndrome [facility]) 1995; 2001    "when my son was born; problems w/speech and L hand since then" (01/19/2015)  . Anxiety   . Depression   . Allergy   . CHF (congestive heart failure) ([place])   . Rheumatoid arthritis(714.0)     "hands" (07/01/2015)  . Diabetes ([place]) dx'd 1982    "went straight to insulin"   Past Surgical History  Procedure Laterality Date  . Cesarean section  1995  . Foot fracture surgery    . Knee arthroscopy Right   . Neuroplasty / transposition median nerve at carpal tunnel    . Left heart catheterization with coronary angiogram N/A 12/08/2014    Procedure: LEFT HEART CATHETERIZATION WITH CORONARY ANGIOGRAM;  Surgeon: Wellington Hampshire, MD;  Location: [facility] CATH LAB;  Service: Cardiovascular;  Laterality: N/A;  . Percutaneous coronary stent intervention (pci-s) N/A  12/09/2014    Procedure: PERCUTANEOUS CORONARY STENT INTERVENTION (PCI-S);  Surgeon: Leonie Man, MD;  Location: [facility] CATH LAB;  Service: Cardiovascular;  Laterality: N/A;  . Cardiac catheterization  12/08/2014  . Coronary angioplasty with stent placement  12/09/2014  . Aortic valve replacement (avr)/coronary artery bypass grafting (cabg)  "2014"    Archie Endo 03/08/2014  . Fracture surgery    . Tubal ligation  1995  . Cataract extraction Left   . Coronary artery bypass graft    . Orif ankle fracture Left 07/04/2015    Procedure: OPEN REDUCTION INTERNAL FIXATION (ORIF)  TRIMAL ANKLE FRACTURE;  Surgeon: Leandrew Koyanagi, MD;  Location: [facility];  Service: Orthopedics;  Laterality: Left;   Family History  Problem Relation Age of Onset  . Stroke    . Heart disease Mother   . Heart disease Sister    Social History  Substance Use Topics  . Smoking status: Never Smoker   . Smokeless tobacco: Never Used  . Alcohol Use: No   OB History    No data available     Review of Systems  10 Systems reviewed and all are negative for acute change except as noted in the HPI.  Allergies  Adhesive; Celebrex; and Detrol  Home Medications   Prior to Admission medications   Medication Sig Start Date End Date Taking? Authorizing Provider  Blood Glucose Monitoring Suppl ([place]) w/Device KIT Use to check glucose 2x per day 03/16/16   Dorothyann Peng, NP  citalopram (CELEXA) 40 MG tablet Take 1 tablet (40 mg total) by mouth daily. 03/08/16   Dorothyann Peng, NP  enoxaparin (LOVENOX) 60 MG/0.6ML injection Inject 0.6 mLs (60 mg total) into the skin every 12 (twelve) hours. Give until INR is 2.5. Patient not taking: Reported on 05/16/2016 05/14/16   Bonnielee Haff, MD  glucose blood test strip Use as instructed Patient taking differently: 1 each by Other route as needed (as instructed). Use as instructed 03/16/16   Dorothyann Peng, NP  HYDROcodone-acetaminophen (NORCO/VICODIN) 5-325 MG tablet Take 1 tablet by  mouth every 6 (six) hours as needed for moderate pain. Reported on 05/16/2016    Historical Provider, MD  insulin aspart (NOVOLOG FLEXPEN) 100 UNIT/ML FlexPen Inject 0-9 Units into the skin 3 (three) times daily with meals. CBG < 70: eat or drink something and recheck, CBG 70 - 120: 0 units CBG 121 - 150: 2 unit CBG 151 - 200: 3 units CBG 201 - 250: 5 units CBG 251 - 300:  6units CBG 301 - 350:  7units CBG 351 - 400: 10 units CBG > 400: call MD. Patient not taking: Reported on 05/16/2016 04/30/16   Dorothyann Peng, NP  insulin detemir (LEVEMIR) 100 UNIT/ML injection Inject 0.1 mLs (10 Units total) into the skin daily. Patient not taking: Reported on 05/16/2016 05/14/16   Bonnielee Haff, MD  omeprazole (PRILOSEC) 40 MG capsule TAKE ONE CAPSULE BY MOUTH EVERY DAY 03/20/16   Dorothyann Peng, NP  ondansetron (ZOFRAN) 4 MG tablet Take 4 mg by mouth every 4 (four) hours. Reported on 05/16/2016    Historical Provider, MD  pravastatin (PRAVACHOL) 20 MG tablet Take 20 mg by mouth daily. 03/08/16   Historical Provider, MD  traZODone (DESYREL) 50 MG tablet Take 50 mg by mouth at bedtime. Reported on 05/16/2016 12/17/15   Historical Provider, MD  warfarin (COUMADIN) 7.5 MG tablet Take 1 tablet (7.5 mg total) by mouth daily at 6 PM. 05/14/16   Bonnielee Haff, MD   BP 103/44 mmHg  Pulse 114  Temp(Src) 98.2 F (36.8 C) (Oral)  Resp 24  SpO2 100% Physical Exam  Constitutional: She is oriented to person, place, and time. She appears well-developed and well-nourished. No distress.  HENT:  Head: Normocephalic and atraumatic.  Nose: Nose normal.  Mouth/Throat: Oropharynx is clear and moist. No oropharyngeal exudate.  Eyes: Conjunctivae and EOM are normal. Pupils are equal, round, and reactive to light. No scleral icterus.  Neck: Normal range of motion. Neck supple. No JVD present. No tracheal deviation present. No thyromegaly present.  Cardiovascular: Normal rate, regular rhythm and normal heart sounds.  Exam reveals  no gallop and no friction rub.   No murmur heard. Pulmonary/Chest: Effort normal and breath sounds normal. No respiratory distress. She has no wheezes. She exhibits no tenderness.  Abdominal: Soft. Bowel sounds are normal. She exhibits no distension and no mass. There is no tenderness. There is no rebound and no guarding.  Musculoskeletal: Normal range of motion. She exhibits no edema or tenderness.  Lymphadenopathy:    She has no cervical adenopathy.  Neurological: She is alert and oriented to person, place, and time. No cranial nerve deficit. She exhibits normal muscle tone.  Skin: Skin is warm and dry. No rash noted. No erythema. No pallor.  Nursing note and vitals reviewed.   ED Course  Procedures (including critical care  time) DIAGNOSTIC STUDIES: Oxygen Saturation is 100% on RA,  normal by my interpretation.    COORDINATION OF CARE: 3:08 AM Discussed treatment plan which includes lab work, CXR, EKG, abx, and IVF with pt at bedside and pt agreed to plan.  Labs Review Labs Reviewed  CBC WITH DIFFERENTIAL/PLATELET - Abnormal; Notable for the following:    WBC 18.0 (*)    RBC 3.25 (*)    Hemoglobin 9.3 (*)    HCT 30.4 (*)    RDW 17.3 (*)    All other components within normal limits  URINALYSIS, ROUTINE W REFLEX MICROSCOPIC (NOT AT [facility]) - Abnormal; Notable for the following:    Glucose, UA >1000 (*)    Ketones, ur >80 (*)    All other components within normal limits  URINE MICROSCOPIC-ADD ON - Abnormal; Notable for the following:    Squamous Epithelial / LPF 0-5 (*)    Bacteria, UA RARE (*)    All other components within normal limits  I-STAT CG4 LACTIC ACID, ED - Abnormal; Notable for the following:    Lactic Acid, Venous 6.50 (*)    All other components within normal limits  CBG MONITORING, ED - Abnormal; Notable for the following:    Glucose-Capillary >600 (*)    All other components within normal limits  I-STAT CHEM 8, ED - Abnormal; Notable for the following:     Sodium 124 (*)    Potassium 5.5 (*)    Chloride 97 (*)    Glucose, Bld >700 (*)    Calcium, Ion 1.12 (*)    Hemoglobin 10.9 (*)    HCT 32.0 (*)    All other components within normal limits  CBG MONITORING, ED - Abnormal; Notable for the following:    Glucose-Capillary >600 (*)    All other components within normal limits  CULTURE, BLOOD (ROUTINE X 2)  CULTURE, BLOOD (ROUTINE X 2)  URINE CULTURE  COMPREHENSIVE METABOLIC PANEL  I-STAT CHEM 8, ED  I-STAT CG4 LACTIC ACID, ED    Imaging Review Dg Chest Port 1 View  05/17/2016  CLINICAL DATA:  Hyperglycemia and hypotension EXAM: PORTABLE CHEST 1 VIEW COMPARISON:  05/10/2016 FINDINGS: Mild airspace opacity in the right upper lobe may represent early infectious infiltrate or aspiration. The left lung is clear. There is no large effusion. The pulmonary vasculature is normal. IMPRESSION: Question early infectious infiltrate or aspiration in the right upper lobe. Electronically Signed   By: Andreas Newport M.D.   On: 05/17/2016 03:36   I have personally reviewed and evaluated these images and lab results as part of my medical decision-making.   EKG Interpretation   Date/Time:  Thursday May 17 2016 03:07:15 EDT Ventricular Rate:  114 PR Interval:    QRS Duration: 93 QT Interval:  342 QTC Calculation: 471 R Axis:   167 Text Interpretation:  Sinus tachycardia Low voltage with right axis  deviation tachycardia now present Confirmed by Glynn Octave  [phone]) on 05/17/2016 4:23:05 AM      MDM   Final diagnoses:  None    Patient presents to the ED for hypotension and hyperglycemia. She states she has been coughing and not feeling well.  She denies any pain.  Labs reveal DKA, CXR shows pnuemonia.  Patient is septic and code sepsis was called.  She was given IVF, 10units IV insulin then placed on drip, vanc and cefepime.  She states she is beginning to feel better.  On my exam she continues to have [place] respirations.  I spoke  with CC who agrees with me, I believe the patient is safe for step down.  Dr. Hal Hope accepts the patient to step down.   I personally performed the services described in this documentation, which was scribed in my presence. The recorded information has been reviewed and is accurate.      [name], MD 05/17/16 [phone]

## 2016-05-17 NOTE — ED Notes (Signed)
Per EMS, pt from home with c/o hyperglycemia and hypotension. CBG-575, initial BP- 86/44 BP on arrival 90/36, HR-112-120, RR-30. Per pt's home health aid, pt received 10 units Lantus Wednesday afternoon.

## 2016-05-17 NOTE — ED Notes (Signed)
Attempted IV X2 

## 2016-05-17 NOTE — Progress Notes (Signed)
ANTICOAGULATION CONSULT NOTE - Follow Up Consult  Pharmacy Consult for heparin Indication: mechanical aortic valve  Allergies  Allergen Reactions  . Adhesive [Tape] Other (See Comments)    Can burn skin if left on too long  . Celebrex [Celecoxib] Rash  . Detrol [Tolterodine] Hives    Patient Measurements: Weight: 146 lb 6.2 oz (66.4 kg) Heparin Dosing Weight: 66.4 kg  Vital Signs: Temp: 98.2 F (36.8 C) (06/29 2026) Temp Source: Oral (06/29 2026) BP: 109/49 mmHg (06/29 2030) Pulse Rate: 76 (06/29 2045)  Labs:  Recent Labs  05/17/16 0340 05/17/16 0343 05/17/16 0627 05/17/16 1933 05/17/16 1936  HGB 9.3* 10.9*  --   --   --   HCT 30.4* 32.0*  --   --   --   PLT 369  --   --   --   --   LABPROT  --   --  15.0  --   --   INR  --   --  1.17  --   --   HEPARINUNFRC  --   --   --   --  0.53  CREATININE 1.65* 0.70 1.52* 0.84  --   TROPONINI  --   --  <0.03  --   --     Estimated Creatinine Clearance: 69.3 mL/min (by C-G formula based on Cr of 0.84).   Medications:  Scheduled:  . [START ON 05/18/2016] ceFEPime (MAXIPIME) IV  2 g Intravenous Q24H  . citalopram  40 mg Oral Daily  . famotidine (PEPCID) IV  20 mg Intravenous Q12H  . [START ON 05/18/2016] insulin aspart  0-15 Units Subcutaneous TID WC  . insulin aspart  0-5 Units Subcutaneous QHS  . insulin glargine  10 Units Subcutaneous Daily  . magnesium sulfate LVP 250-500 ml  4 g Intravenous Once  . pravastatin  20 mg Oral Daily  . sodium phosphate  Dextrose 5% IVPB  20 mmol Intravenous Once  . [START ON 05/18/2016] vancomycin  1,000 mg Intravenous Q24H   Infusions:  . sodium chloride Stopped (05/17/16 1330)  . heparin Stopped (05/17/16 1909)  . insulin (NOVOLIN-R) infusion 1.7 mL/hr at 05/17/16 2000  . phenylephrine (NEO-SYNEPHRINE) Adult infusion 20 mcg/min (05/17/16 2000)    Assessment: 55 yo female with  mechanical aortic valve is currently on heparin.  Heparin was stopped at 1915 in anticipation of central  line but it was ultimately not done due to lab was able to obtain lab again.  A heparin level was obtained at 1936 and was therapeutic at 0.53 (considering the half life of heparin, the extrapolated level should be ~0.6 - 0.7).  The nurse resumed heparin at ~2130.  Goal of Therapy:  Heparin level 0.3-0.7 units/ml Monitor platelets by anticoagulation protocol: Yes   Plan:  - continue heparin at 850 units/hr - confirm with am lab  Oriel Rumbold, [place] 05/17/2016,9:11 PM

## 2016-05-17 NOTE — Progress Notes (Signed)
Pharmacy Antibiotic Note  Madison [name] is a 55 y.o. female admitted on 05/17/2016 with pneumonia.  Pharmacy has been consulted for Vancomycin and Cefepime dosing x 8 days.  Vanc 1.5gm and Cefepime 2gm given in ED ~0400.  Plan: Cefepime 1gm IV q8h x 8 days Vancomycin 750mg  IV q12h x 8 days Will f/u micro data, renal function, and pt's clinical condition Vanc trough prn   Weight: 120 lb (54.432 kg)  Temp (24hrs), Avg:98.3 F (36.8 C), Min:98.2 F (36.8 C), Max:98.4 F (36.9 C)   Recent Labs Lab 05/10/16 1824 05/10/16 1958  05/11/16 0540 05/11/16 0829  05/11/16 1348  05/12/16 0500 05/13/16 0336 05/14/16 0645 05/17/16 0340 05/17/16 0343  WBC  --   --   --  14.9*  --   --   --   --  7.4 6.2 4.1 18.0*  --   CREATININE  --  0.69  < >  --   --   < > 0.55  < > 0.58 0.52 0.53 1.65* 0.70  LATICACIDVEN 2.4* 1.9  --   --  3.3*  --  0.9  --   --   --   --   --  6.50*  < > = values in this interval not displayed.  Estimated Creatinine Clearance: 65.7 mL/min (by C-G formula based on Cr of 0.7).    Allergies  Allergen Reactions  . Adhesive [Tape] Other (See Comments)    Can burn skin if left on too long  . Celebrex [Celecoxib] Rash  . Detrol [Tolterodine] Hives    Antimicrobials this admission: 6/29 Vanc >>  6/29 Cefepime >>   Dose adjustments this admission: n/a  Microbiology results: 6/29 BCx x2:  6/29 UCx:    Sputum:    Thank you for allowing pharmacy to be a part of this patient's care.  7/29, PharmD, BCPS Clinical pharmacist, pager [phone] 05/17/2016 6:28 AM

## 2016-05-17 NOTE — Progress Notes (Signed)
[facility] staff Atika called to check on pt at [facility]  Pt found at [facility]  Provided Atika with pt location to follow for d/c needs

## 2016-05-17 NOTE — Progress Notes (Signed)
eLink Physician-Brief Progress Note Patient Name: KHRISTINA JANOTA DOB: 1961/01/08 MRN: [id number]   Date of Service  05/17/2016  HPI/Events of Note  Gap goner, luyes low  eICU Interventions  supp lytes Transition off drip     Intervention Category Major Interventions: Electrolyte abnormality - evaluation and management  [name]. 05/17/2016, 8:47 PM

## 2016-05-17 NOTE — ED Notes (Addendum)
Madison Fail, MD informed of patients low BP; additional bolus ordered; reminded that pt is CHF; MD insisted that fluids be given

## 2016-05-17 NOTE — Progress Notes (Addendum)
ANTICOAGULATION CONSULT NOTE - Initial Consult  Pharmacy Consult for heparin when INR < 2.5 Indication: mechanical aortic valve  Allergies  Allergen Reactions  . Adhesive [Tape] Other (See Comments)    Can burn skin if left on too long  . Celebrex [Celecoxib] Rash  . Detrol [Tolterodine] Hives    Patient Measurements: Weight: 120 lb (54.432 kg)Vital Signs: Temp: 98.4 F (36.9 C) (06/29 0348) Temp Source: Rectal (06/29 0348) BP: 106/37 mmHg (06/29 0530) Pulse Rate: 114 (06/29 0530)  Labs:  Recent Labs  05/14/16 0645 05/17/16 0340 05/17/16 0343  HGB 8.7* 9.3* 10.9*  HCT 26.3* 30.4* 32.0*  PLT 182 369  --   LABPROT 19.4*  --   --   INR 1.63*  --   --   CREATININE 0.53 1.65* 0.70    Estimated Creatinine Clearance: 65.7 mL/min (by C-G formula based on Cr of 0.7).   Medical History: Past Medical History  Diagnosis Date  . CAD (coronary artery disease)   . Obesity   . Hypercholesteremia   . HTN (hypertension)   . Dyslipidemia   . Aortic stenosis   . Thrombophlebitis   . Heart murmur   . Myocardial infarction (HCC) 12/2014  . Pneumonia 11/2014; 12/2014  . GERD (gastroesophageal reflux disease)   . Stroke syndrome [facility]) 1995; 2001    "when my son was born; problems w/speech and L hand since then" (01/19/2015)  . Anxiety   . Depression   . Allergy   . CHF (congestive heart failure) (HCC)   . Rheumatoid arthritis(714.0)     "hands" (07/01/2015)  . Diabetes (HCC) dx'd 1982    "went straight to insulin"    Assessment: 55 yo F on coumadin PTA for mechanical aortic valve.   She was just discharged from [facility] hospital 05/14/16.  The [facility] clinic note on 6/12 indicated her coumadin dose was 7.5 mg daily with INR goal 2.5 - 3.5.   Pt in ED with hyperglycemia and hypotension.  Pt unable to participate in medication history in ED.  No INR ordered yet.  Hg 10.9, pltc 369.   Goal of Therapy:  HL 0.3 - 0.7 Monitor platelets by anticoagulation protocol: Yes   Plan:  Daily  INR Start heparin once INR < 2.5  Herby Abraham, Pharm.D. [phone] 05/17/2016 5:57 AM   Addendum: INR 1.17. Plan: heparin bolus 3000 units IV x 1 and drip at 850 units/hr Check 6 hr HL Daily HL/CBC  Herby Abraham, Pharm.D. [phone] 05/17/2016 7:25 AM

## 2016-05-17 NOTE — Care Management Note (Addendum)
Case Management Note  Patient Details  Name: Madison [name] MRN: [id number] Date of Birth: 1961-10-22  Subjective/Objective:                  55 yo female pt from home with c/o hyperglycemia and hypotension./ Ha home health and has [facility] services.  Action/Plan: Follow for disposition needs.   Expected Discharge Date:  05/21/16               Expected Discharge Plan:  Skilled Nursing Facility vs resuming home health services  In-House Referral:  Clinical Social Work  Discharge planning Services  CM Consult  Post Acute Care Choice:  Resumption of Svcs/PTA Provider Choice offered to:     DME Arranged:    DME Agency:     HH Arranged:    HH Agency:  [facility]  Status of Service:  In process, will continue to follow  If discussed at Long Length of Stay Meetings, dates discussed:    Additional Comments: Trinidy Masterson, Manfred Arch CM spoke with pt in detail regarding discharge plan.  Pt has 11 admits in the last 6 months.  Pt had HH that is refusing to take post discharge- stated " pt can not be taught to administer insulin"  Pt has refused placement in the past - Pt informed CM that she was moving to [place] post discharge for mother to take care of her - spoke with family and was told that was incorrect.  Family request that pt be placed in SNF as they are also concerned with pt returning home - while awaiting approval of [facility] day program - however pt is currently refusing.  Family has appt with [facility] Thursday July 6 at pts apartment to apply for program. [facility] following pt.   CM consulted with CSW for safe discharge plan and requested pysch consult to determine competence - consult has been ordered    Pt was active with [facility] for RN, PT, OT, NA services, but [facility] will not take her back as they feel she is unsafe at home per Santo Held.  If pt is to have home health services, she will have to choose another agency.   Cherylann Parr, RN 05/17/2016, 11:48 AM

## 2016-05-17 NOTE — ED Notes (Signed)
Repeated order to MD. MD ordered 2 additional boluses. Bolus #5 & #6.

## 2016-05-17 NOTE — Care Management Note (Addendum)
Case Management Note  Patient Details  Name: Madison [name] MRN: [id number] Date of Birth: 02/08/1961  Subjective/Objective:                  55 yo female pt from home with c/o hyperglycemia and hypotension./ [facility] home health and has [facility] services.  Action/Plan: Follow for disposition needs.   Expected Discharge Date:  05/21/16               Expected Discharge Plan:  Skilled Nursing Facility vs resuming home health services  In-House Referral:  Clinical Social Work  Discharge planning Services  CM Consult  Post Acute Care Choice:  Resumption of Svcs/PTA Provider Choice offered to:     DME Arranged:    DME Agency:     HH Arranged:    HH Agency:  [facility]  Status of Service:  In process, will continue to follow  If discussed at Long Length of Stay Meetings, dates discussed:    Additional Comments: Pt was active with [facility] for RN, PT, OT, NA services, but [facility] will not take her back as they feel she is unsafe at home per Santo Held.  If pt is to have home health services, she will have to choose another agency.   Oletta Cohn, RN 05/17/2016, 11:12 AM

## 2016-05-17 NOTE — Progress Notes (Signed)
RT attempted ABG x's 2 but was unable to obtain sample. RN ready to transport patient to assigned room.  RT will report to receiving RT to try again later.

## 2016-05-17 NOTE — Progress Notes (Signed)
Inpatient Diabetes Program Recommendations  AACE/ADA: New Consensus Statement on Inpatient Glycemic Control (2015)  Target Ranges:  Prepandial:   less than 140 mg/dL      Peak postprandial:   less than 180 mg/dL (1-2 hours)      Critically ill patients:  140 - 180 mg/dL   Review of Glycemic Control  Inpatient Diabetes Program Recommendations:  Reviewed notes. Patient known from previous admissions. Noted patient is currently on insulin drip. When ready for transition from IV insulin, will need basal to be given 2 hrs prior to drip discontinued and cover CBG with Novolog correction when drip stopped. Will follow in hospital.  Thank you, Billy Fischer. Indira Sorenson, RN, MSN, CDE Inpatient Glycemic Control Team Team Pager [phone] (8am-5pm) 05/17/2016 1:09 PM

## 2016-05-17 NOTE — Consult Note (Signed)
   [facility] CM Inpatient Consult   05/17/2016  Madison [name] 04-26-1961 [id number]   Madison [name] is active with [facility] Care Management program. She just had a multidisciplinary [facility] Care Management visit on yesterday 05/16/16 at home. Please see chart review tab then notes for further details. Made aware of patient going to ED by [facility] RNCM. Spoke with Madison [name] ED RNCM who confirmed patient is at [facility] in stepdown. Madison [name] continues to admit with DKA. She is continues to be non-adherent with medications and plan of care once she gets home. Left voicemail for inpatient RNCM to make aware that [facility] Care Management is following. Appreciate psych consult being ordered. Will continue to follow.    Raiford Noble, MSN-Ed, RN,BSN [facility] Liaison [phone]

## 2016-05-17 NOTE — Progress Notes (Signed)
Pharmacy Antibiotic Note  Madison [name] is a 55 y.o. female admitted on 05/17/2016 with pneumonia.  Pharmacy has been consulted for Vancomycin and Cefepime dosing x 8 days.  Renal fx has doubled  Plan: Cefepime 2g q24h x 8 days Vancomycin 1g q24h x 8 days Will f/u micro data, renal function, vt prn   Weight: 146 lb 6.2 oz (66.4 kg)  Temp (24hrs), Avg:98.3 F (36.8 C), Min:98.2 F (36.8 C), Max:98.4 F (36.9 C)   Recent Labs Lab 05/10/16 1958  05/11/16 0540 05/11/16 0829  05/11/16 1348  05/12/16 0500 05/13/16 0336 05/14/16 0645 05/17/16 0340 05/17/16 0343 05/17/16 0627  WBC  --   --  14.9*  --   --   --   --  7.4 6.2 4.1 18.0*  --   --   CREATININE 0.69  < >  --   --   < > 0.55  < > 0.58 0.52 0.53 1.65* 0.70 1.52*  LATICACIDVEN 1.9  --   --  3.3*  --  0.9  --   --   --   --   --  6.50* 4.5*  < > = values in this interval not displayed.  Estimated Creatinine Clearance: 38.3 mL/min (by C-G formula based on Cr of 1.52).    Allergies  Allergen Reactions  . Adhesive [Tape] Other (See Comments)    Can burn skin if left on too long  . Celebrex [Celecoxib] Rash  . Detrol [Tolterodine] Hives    Antimicrobials this admission: 6/29 Vanc >>  6/29 Cefepime >>   Dose adjustments this admission: n/a  Microbiology results: 6/29 BCx x2:  6/29 UCx:    Sputum:    Isaac Bliss, PharmD, BCPS, [facility] Clinical Pharmacist Pager [phone] 05/17/2016 12:43 PM

## 2016-05-17 NOTE — Patient Outreach (Signed)
[facility]) Care Management  05/17/2016  Madison [name] June 17, 1961 [id number]   CSW was scheduled to meet with patient today to perform a routine home visit; however, patient was not available at the time of CSW's arrival.  CSW then noted that patient had been readmitted into the hospital today (Thursday, June 29th) due to Hyperglycemia and Hypotension.  Patient is currently being followed by the Critical Care Team.  CSW will continue to assess and assist with discharge planning needs and services. Danford Bad, BSW, MSW, LCSW  Licensed Restaurant manager, [employer]  [employer] [place] [address] [place], [place] [address] Physical Address-[address], [place], [place] [address] Toll Free Main # [phone] Fax # [phone] Cell # [phone]  Fax # [phone]  [name]

## 2016-05-17 NOTE — H&P (Signed)
History and Physical    Madison [name] SWN:[id number] DOB: [birth year] DOA: 05/17/2016  PCP: Dorothyann Peng, NP  Patient coming from: Home.  Chief Complaint: Hypotension.  HPI: Madison [name] is a 55 y.o. female with diabetes mellitus type 1, mechanical aortic valve, CAD status post CABG who was discharged 2 days ago was found to be hypotensive by patient's home health aide. Patient was referred to the ER. The ER patient's blood pressure was in the 80 systolic with heart rate around 130 beats per minute. Patient's lab work show markedly elevated blood sugar with bicarbonate less than 7 lactate more than 6.. Patient was started on IV fluid boluses for DKA and possible sepsis along with IV insulin infusion for DKA. Chest x-ray shows infiltrates concerning for pneumonia. Patient states since yesterday morning patient has been having recurrent episodes of nausea vomiting denies any abdominal pain or diarrhea. Denies chest pain or shortness of breath.   ED Course: See history of present illness.  Review of Systems: As per HPI, rest all negative.   Past Medical History  Diagnosis Date  . CAD (coronary artery disease)   . Obesity   . Hypercholesteremia   . HTN (hypertension)   . Dyslipidemia   . Aortic stenosis   . Thrombophlebitis   . Heart murmur   . Myocardial infarction ([place]) 12/2014  . Pneumonia 11/2014; 12/2014  . GERD (gastroesophageal reflux disease)   . Stroke syndrome [facility] 1995; 2001    "when my son was born; problems w/speech and L hand since then" (01/19/2015)  . Anxiety   . Depression   . Allergy   . CHF (congestive heart failure) ([place])   . Rheumatoid arthritis(714.0)     "hands" (07/01/2015)  . Diabetes ([place]) dx'd 1982    "went straight to insulin"    Past Surgical History  Procedure Laterality Date  . Cesarean section  1995  . Foot fracture surgery    . Knee arthroscopy Right   . Neuroplasty / transposition median nerve at carpal tunnel    . Left heart catheterization  with coronary angiogram N/A 12/08/2014    Procedure: LEFT HEART CATHETERIZATION WITH CORONARY ANGIOGRAM;  Surgeon: Wellington Hampshire, MD;  Location: [facility] CATH LAB;  Service: Cardiovascular;  Laterality: N/A;  . Percutaneous coronary stent intervention (pci-s) N/A 12/09/2014    Procedure: PERCUTANEOUS CORONARY STENT INTERVENTION (PCI-S);  Surgeon: Leonie Man, MD;  Location: [facility] CATH LAB;  Service: Cardiovascular;  Laterality: N/A;  . Cardiac catheterization  12/08/2014  . Coronary angioplasty with stent placement  12/09/2014  . Aortic valve replacement (avr)/coronary artery bypass grafting (cabg)  "2014"    Archie Endo 03/08/2014  . Fracture surgery    . Tubal ligation  1995  . Cataract extraction Left   . Coronary artery bypass graft    . Orif ankle fracture Left 07/04/2015    Procedure: OPEN REDUCTION INTERNAL FIXATION (ORIF)  TRIMAL ANKLE FRACTURE;  Surgeon: Leandrew Koyanagi, MD;  Location: [place];  Service: Orthopedics;  Laterality: Left;     reports that she has never smoked. She has never used smokeless tobacco. She reports that she does not drink alcohol or use illicit drugs.  Allergies  Allergen Reactions  . Adhesive [Tape] Other (See Comments)    Can burn skin if left on too long  . Celebrex [Celecoxib] Rash  . Detrol [Tolterodine] Hives    Family History  Problem Relation Age of Onset  . Stroke    . Heart disease Mother   .  Heart disease Sister     Prior to Admission medications   Medication Sig Start Date End Date Taking? Authorizing Provider  Blood Glucose Monitoring Suppl ([place]) w/Device KIT Use to check glucose 2x per day 03/16/16   Dorothyann Peng, NP  citalopram (CELEXA) 40 MG tablet Take 1 tablet (40 mg total) by mouth daily. 03/08/16   Dorothyann Peng, NP  enoxaparin (LOVENOX) 60 MG/0.6ML injection Inject 0.6 mLs (60 mg total) into the skin every 12 (twelve) hours. Give until INR is 2.5. Patient not taking: Reported on 05/16/2016 05/14/16   Bonnielee Haff, MD    glucose blood test strip Use as instructed Patient taking differently: 1 each by Other route as needed (as instructed). Use as instructed 03/16/16   Dorothyann Peng, NP  HYDROcodone-acetaminophen (NORCO/VICODIN) 5-325 MG tablet Take 1 tablet by mouth every 6 (six) hours as needed for moderate pain. Reported on 05/16/2016    Historical Provider, MD  insulin aspart (NOVOLOG FLEXPEN) 100 UNIT/ML FlexPen Inject 0-9 Units into the skin 3 (three) times daily with meals. CBG < 70: eat or drink something and recheck, CBG 70 - 120: 0 units CBG 121 - 150: 2 unit CBG 151 - 200: 3 units CBG 201 - 250: 5 units CBG 251 - 300:  6units CBG 301 - 350:  7units CBG 351 - 400: 10 units CBG > 400: call MD. Patient not taking: Reported on 05/16/2016 04/30/16   Dorothyann Peng, NP  insulin detemir (LEVEMIR) 100 UNIT/ML injection Inject 0.1 mLs (10 Units total) into the skin daily. Patient not taking: Reported on 05/16/2016 05/14/16   Bonnielee Haff, MD  omeprazole (PRILOSEC) 40 MG capsule TAKE ONE CAPSULE BY MOUTH EVERY DAY 03/20/16   Dorothyann Peng, NP  ondansetron (ZOFRAN) 4 MG tablet Take 4 mg by mouth every 4 (four) hours. Reported on 05/16/2016    Historical Provider, MD  pravastatin (PRAVACHOL) 20 MG tablet Take 20 mg by mouth daily. 03/08/16   Historical Provider, MD  traZODone (DESYREL) 50 MG tablet Take 50 mg by mouth at bedtime. Reported on 05/16/2016 12/17/15   Historical Provider, MD  warfarin (COUMADIN) 7.5 MG tablet Take 1 tablet (7.5 mg total) by mouth daily at 6 PM. 05/14/16   Bonnielee Haff, MD    Physical Exam: Filed Vitals:   05/17/16 0445 05/17/16 0515 05/17/16 0530 05/17/16 0600  BP: 108/45 118/54 106/37 105/48  Pulse: 120 129 114 109  Temp:      TempSrc:      Resp: 24 27 32 29  Weight:      SpO2: 100% 100% 100% 100%      Constitutional: Not in distress. Filed Vitals:   05/17/16 0445 05/17/16 0515 05/17/16 0530 05/17/16 0600  BP: 108/45 118/54 106/37 105/48  Pulse: 120 129 114 109  Temp:       TempSrc:      Resp: 24 27 32 29  Weight:      SpO2: 100% 100% 100% 100%   Eyes: Anicteric no pallor. ENMT: No discharge from the ears eyes nose or mouth. Neck: No mass felt. No JVD appreciated. Respiratory - no rhonchi or crepitations. Cardiovascular: S1 and S2 heard. Abdomen: Soft nontender bowel sounds present. Musculoskeletal: No edema. Left upper extremity contracture. Skin: No rash. Neurologic: Alert awake oriented to time place and person and has difficulty speaking due to previous stroke with left upper extremity contracture. Psychiatric: Appears normal.   Labs on Admission: I have personally reviewed following labs and imaging studies  CBC:  Recent Labs Lab 05/10/16 0918 05/11/16 0540 05/12/16 0500 05/13/16 0336 05/14/16 0645 05/17/16 0340 05/17/16 0343  WBC 19.3* 14.9* 7.4 6.2 4.1 18.0*  --   NEUTROABS 15.2* 11.6*  --   --   --  14.9*  --   HGB 8.5* 8.1* 6.7* 8.8* 8.7* 9.3* 10.9*  HCT 29.3* 25.3* 20.1* 26.2* 26.3* 30.4* 32.0*  MCV 98.0 86.9 82.7 85.3 86.2 93.5  --   PLT 470* 323 239 205 182 369  --    Basic Metabolic Panel:  Recent Labs Lab 05/11/16 2200 05/12/16 0500 05/13/16 0336 05/14/16 0645 05/17/16 0340 05/17/16 0343  NA 136 139 140 138 127* 124*  K 3.2* 3.4* 3.7 4.0 5.6* 5.5*  CL 111 112* 114* 109 93* 97*  CO2 20* _0 <7*  --   GLUCOSE 120* 92 61* 142* 764* >700*  BUN <5* <5* 6 <5* 14 13  CREATININE 0.54 0.58 0.52 0.53 1.65* 0.70  CALCIUM 7.2* 7.5* 7.6* 8.0* 8.9  --   MG  --  1.6* 2.1  --   --   --   PHOS  --  1.6*  --   --   --   --    GFR: Estimated Creatinine Clearance: 65.7 mL/min (by C-G formula based on Cr of 0.7). Liver Function Tests:  Recent Labs Lab 05/10/16 0752 05/12/16 0500 05/13/16 0336 05/17/16 0340  AST 30  --  34 21  ALT 20  --  17 16  ALKPHOS 102  --  75 95  BILITOT 2.0*  --  0.7 1.6*  PROT 6.0*  --  4.5* 5.6*  ALBUMIN 3.0* 1.8* 2.1* 2.7*   No results for input(s): LIPASE, AMYLASE in the last 168  hours.  Recent Labs Lab 05/11/16 1348  AMMONIA <9*   Coagulation Profile:  Recent Labs Lab 05/10/16 1335 05/12/16 0835 05/13/16 0336 05/14/16 0645  INR 1.43 1.15 1.24 1.63*   Cardiac Enzymes:  Recent Labs Lab 05/11/16 0540 05/11/16 1348 05/11/16 2200  TROPONINI 0.42* 0.33* 0.25*   BNP (last 3 results) No results for input(s): PROBNP in the last 8760 hours. HbA1C: No results for input(s): HGBA1C in the last 72 hours. CBG:  Recent Labs Lab 05/14/16 0449 05/14/16 0729 05/17/16 0319 05/17/16 0436 05/17/16 0508  GLUCAP 88 146* >600* >600* >600*   Lipid Profile: No results for input(s): CHOL, HDL, LDLCALC, TRIG, CHOLHDL, LDLDIRECT in the last 72 hours. Thyroid Function Tests: No results for input(s): TSH, T4TOTAL, FREET4, T3FREE, THYROIDAB in the last 72 hours. Anemia Panel: No results for input(s): VITAMINB12, FOLATE, FERRITIN, TIBC, IRON, RETICCTPCT in the last 72 hours. Urine analysis:    Component Value Date/Time   COLORURINE YELLOW 05/17/2016 [place] 05/17/2016 0353   LABSPEC 1.025 05/17/2016 0353   PHURINE 5.0 05/17/2016 0353   GLUCOSEU >1000* 05/17/2016 0353   HGBUR NEGATIVE 05/17/2016 0353   BILIRUBINUR NEGATIVE 05/17/2016 0353   BILIRUBINUR n 06/16/2015 1244   KETONESUR >80* 05/17/2016 0353   PROTEINUR NEGATIVE 05/17/2016 0353   PROTEINUR n 06/16/2015 1244   UROBILINOGEN 0.2 07/30/2015 1415   UROBILINOGEN 0.2 06/16/2015 1244   NITRITE NEGATIVE 05/17/2016 0353   NITRITE n 06/16/2015 1244   LEUKOCYTESUR NEGATIVE 05/17/2016 0353   Sepsis Labs: _1 (procalcitonin:4,lacticidven:4) ) Recent Results (from the past 240 hour(s))  Urine culture     Status: Abnormal   Collection Time: 05/10/16 10:02 AM  Result Value Ref Range Status   Specimen Description URINE, CLEAN CATCH  Final   Special Requests Normal  Final   Culture (A)  Final    1,000 COLONIES/mL INSIGNIFICANT GROWTH Performed at [facility]    Report  Status 05/11/2016 FINAL  Final  MRSA PCR Screening     Status: None   Collection Time: 05/10/16 12:20 PM  Result Value Ref Range Status   MRSA by PCR NEGATIVE NEGATIVE Final    Comment:        The GeneXpert MRSA Assay (FDA approved for NASAL specimens only), is one component of a comprehensive MRSA colonization surveillance program. It is not intended to diagnose MRSA infection nor to guide or monitor treatment for MRSA infections.   Culture, blood (routine x 2)     Status: None   Collection Time: 05/10/16  1:02 PM  Result Value Ref Range Status   Specimen Description BLOOD RIGHT ARM  Final   Special Requests IN PEDIATRIC BOTTLE 3CC  Final   Culture   Final    NO GROWTH 5 DAYS Performed at [facility]    Report Status 05/15/2016 FINAL  Final  Culture, blood (routine x 2)     Status: None   Collection Time: 05/10/16  1:41 PM  Result Value Ref Range Status   Specimen Description BLOOD RIGHT ANTECUBITAL  Final   Special Requests IN PEDIATRIC BOTTLE 1 CC  Final   Culture   Final    NO GROWTH 5 DAYS Performed at [facility]    Report Status 05/15/2016 FINAL  Final     Radiological Exams on Admission: Dg Chest Port 1 View  05/17/2016  CLINICAL DATA:  Hyperglycemia and hypotension EXAM: PORTABLE CHEST 1 VIEW COMPARISON:  05/10/2016 FINDINGS: Mild airspace opacity in the right upper lobe may represent early infectious infiltrate or aspiration. The left lung is clear. There is no large effusion. The pulmonary vasculature is normal. IMPRESSION: Question early infectious infiltrate or aspiration in the right upper lobe. Electronically Signed   By: Andreas Newport M.D.   On: 05/17/2016 03:36    EKG: Independently reviewed. Sinus tachycardia.  Assessment/Plan Active Problems:   CAD (coronary artery disease)   Diabetic ketoacidosis without coma associated with type 1 diabetes mellitus (HCC)   CHF (congestive heart failure) (HCC)   S/P AVR (aortic valve  replacement)   AKI (acute kidney injury) ([place])   DKA (diabetic ketoacidoses) (HCC)   Aspiration pneumonia ([place])    1. Diabetic ketoacidosis Type 1 - patient has history of noncompliance with medications. Not sure if patient took her medications after discharge. Patient has been placed on IV fluid boluses and IV insulin infusion and I have ordered ABG. Closely follow metabolic panel. Continue with aggressive hydration and once Anion gap gets corrected change to long-acting insulin. 2. Possible sepsis secondary to aspiration pneumonia - I have placed patient on empiric antibiotics follow cultures procalcitonin levels lactic acid levels and continue hydration. 3. Acute renal failure probably from nausea vomiting and dehydration - continue with aggressive hydration and follow metabolic panel intake output. 4. History of mechanical aortic valve replacement - heparin per pharmacy if INR is less than 2.5. 5. CAD status post CABG - denies any chest pain. Check troponin. 6. History of CHF - presently appears dry and receiving aggressive hydration.  Since patient looks critically ill have consulted critical care.   DVT prophylaxis: Heparin infusion. Code Status: Full code.  Family Communication: No family at the bedside.  Disposition Plan: Home. May need to consider nursing home.  Consults called:  Critical care.  Admission status: Inpatient stepdown.    Rise Patience MD [facility] Pager [phone].  If 7PM-7AM, please contact night-coverage [email or web address] Password [facility]  05/17/2016, 6:13 AM

## 2016-05-17 NOTE — Progress Notes (Signed)
Notified Dr. Tyson Alias about not being able to collect ordered labs due to poor access. Phlebotomy attempted by three different people, all unsuccessful. CCM to place CVC.

## 2016-05-17 NOTE — ED Notes (Signed)
CBG: 208 

## 2016-05-18 DIAGNOSIS — J690 Pneumonitis due to inhalation of food and vomit: Secondary | ICD-10-CM

## 2016-05-18 DIAGNOSIS — F331 Major depressive disorder, recurrent, moderate: Secondary | ICD-10-CM | POA: Insufficient documentation

## 2016-05-18 DIAGNOSIS — F4323 Adjustment disorder with mixed anxiety and depressed mood: Secondary | ICD-10-CM

## 2016-05-18 DIAGNOSIS — Z954 Presence of other heart-valve replacement: Secondary | ICD-10-CM

## 2016-05-18 LAB — CBC
HCT: 26.8 % — ABNORMAL LOW (ref 36.0–46.0)
HEMOGLOBIN: 8.5 g/dL — AB (ref 12.0–15.0)
MCH: 28.1 pg (ref 26.0–34.0)
MCHC: 31.7 g/dL (ref 30.0–36.0)
MCV: 88.7 fL (ref 78.0–100.0)
PLATELETS: 311 10*3/uL (ref 150–400)
RBC: 3.02 MIL/uL — AB (ref 3.87–5.11)
RDW: 16.8 % — ABNORMAL HIGH (ref 11.5–15.5)
WBC: 8.4 10*3/uL (ref 4.0–10.5)

## 2016-05-18 LAB — GLUCOSE, CAPILLARY
GLUCOSE-CAPILLARY: 194 mg/dL — AB (ref 65–99)
Glucose-Capillary: 65 mg/dL (ref 65–99)
Glucose-Capillary: 111 mg/dL — ABNORMAL HIGH (ref 65–99)
Glucose-Capillary: 156 mg/dL — ABNORMAL HIGH (ref 65–99)
Glucose-Capillary: 121 mg/dL — ABNORMAL HIGH (ref 65–99)

## 2016-05-18 LAB — BASIC METABOLIC PANEL
ANION GAP: 8 (ref 5–15)
BUN: <5 mg/dL — ABNORMAL LOW (ref 6–20)
CHLORIDE: 109 mmol/L (ref 101–111)
CO2: 19 mmol/L — AB (ref 22–32)
Calcium: 7.9 mg/dL — ABNORMAL LOW (ref 8.9–10.3)
Creatinine, Ser: 0.75 mg/dL (ref 0.44–1.00)
GFR calc non Af Amer: >60 mL/min (ref >60)
Glucose, Bld: 69 mg/dL (ref 65–99)
Potassium: 3.5 mmol/L (ref 3.5–5.1)
Sodium: 136 mmol/L (ref 135–145)

## 2016-05-18 LAB — PHOSPHORUS: Phosphorus: 2.7 mg/dL (ref 2.5–4.6)

## 2016-05-18 LAB — URINE CULTURE: CULTURE: NO GROWTH

## 2016-05-18 LAB — LEGIONELLA PNEUMOPHILA SEROGP 1 UR AG: L. pneumophila Serogp 1 Ur Ag: NEGATIVE

## 2016-05-18 LAB — MAGNESIUM: MAGNESIUM: 2.3 mg/dL (ref 1.7–2.4)

## 2016-05-18 LAB — PROTIME-INR
INR: 1.2 (ref 0.00–1.49)
PROTHROMBIN TIME: 15.4 s — AB (ref 11.6–15.2)

## 2016-05-18 LAB — HEPARIN LEVEL (UNFRACTIONATED): Heparin Unfractionated: 0.46 IU/mL (ref 0.30–0.70)

## 2016-05-18 MED ORDER — VANCOMYCIN HCL IN DEXTROSE 750-5 MG/150ML-% IV SOLN
750.0000 mg | Freq: Two times a day (BID) | INTRAVENOUS | Status: DC
  Administered 2016-05-18 – 2016-05-20 (×4): 750 mg via INTRAVENOUS
  Filled 2016-05-18 (×5): qty 150

## 2016-05-18 MED ORDER — DEXTROSE 5 % IV SOLN
2.0000 g | Freq: Two times a day (BID) | INTRAVENOUS | Status: DC
  Administered 2016-05-18 – 2016-05-19 (×2): 2 g via INTRAVENOUS
  Filled 2016-05-18 (×4): qty 2

## 2016-05-18 MED ORDER — INSULIN ASPART 100 UNIT/ML ~~LOC~~ SOLN
0.0000 [IU] | Freq: Three times a day (TID) | SUBCUTANEOUS | Status: DC
  Administered 2016-05-19 – 2016-05-20 (×2): 5 [IU] via SUBCUTANEOUS
  Administered 2016-05-20: 3 [IU] via SUBCUTANEOUS
  Administered 2016-05-20: 8 [IU] via SUBCUTANEOUS
  Administered 2016-05-21: 15 [IU] via SUBCUTANEOUS
  Administered 2016-05-22: 2 [IU] via SUBCUTANEOUS
  Administered 2016-05-22: 3 [IU] via SUBCUTANEOUS
  Administered 2016-05-22: 2 [IU] via SUBCUTANEOUS
  Administered 2016-05-23 (×2): 15 [IU] via SUBCUTANEOUS
  Administered 2016-05-24: 8 [IU] via SUBCUTANEOUS
  Administered 2016-05-24: 3 [IU] via SUBCUTANEOUS
  Administered 2016-05-24: 5 [IU] via SUBCUTANEOUS

## 2016-05-18 MED ORDER — FAMOTIDINE 20 MG PO TABS
40.0000 mg | ORAL_TABLET | Freq: Every day | ORAL | Status: DC
  Administered 2016-05-18 – 2016-05-23 (×6): 40 mg via ORAL
  Filled 2016-05-18 (×6): qty 2

## 2016-05-18 NOTE — Consult Note (Signed)
[facility] Face-to-Face Psychiatry Consult   Reason for Consult:  Capacity evaluation Referring Physician:  Dr. Chase Caller Patient Identification: Madison [name] MRN:  [id number] Principal Diagnosis: Diabetic ketoacidosis without coma associated with type 1 diabetes mellitus ([place]) Diagnosis:   Patient Active Problem List   Diagnosis Date Noted  . DKA (diabetic ketoacidoses) ([place]) [E13.10] 05/17/2016  . Aspiration pneumonia ([place]) [J69.0] 05/17/2016  . Diabetic ketoacidosis without coma ([place]) [E13.10]   . Sepsis ([place]) [A41.9]   . 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 MSOF (multiple systems organ failure) [R69] 05/01/2016  . Acute hypoxemic respiratory failure ([place]) [J96.01]   . Type I diabetes mellitus with complication, uncontrolled ([place]) [E10.8, E10.65] 03/29/2016  . Adjustment disorder with mixed anxiety and depressed mood [F43.23] 03/28/2016  . HLD (hyperlipidemia) [E78.5] 03/27/2016  . AKI (acute kidney injury) ([place]) [N17.9] 03/27/2016  . Chronic combined systolic and diastolic congestive heart failure ([place]) [I50.42]   . Diabetic ketoacidosis ([place]) [E13.10] 03/26/2016  . Type 1 diabetes, HbA1c goal < 7% (HCC) [E10.9]   . Hypotension [I95.9] 03/08/2016  . Uncontrolled type 1 diabetes mellitus with ketoacidotic coma ([place]) [E10.11]   . Acute encephalopathy [G93.40] 02/28/2016  . Hyponatremia: Pseudo [E87.1] 02/28/2016  . Lactic acidosis [E87.2]   . Diabetic ketoacidosis with coma associated with type 1 diabetes mellitus ([place]) [E10.11]   . Hypoglycemia [E16.2]   . Chronic systolic heart failure ([place]) [I50.22]   . Esophageal reflux [K21.9]   . S/P AVR (aortic valve replacement) [Z95.4]   . ARF (acute renal failure) ([place]) [N17.9] 01/22/2016  . CHF (congestive heart failure) ([place]) [I50.9] 01/22/2016  . Diabetic ketoacidosis without coma associated with type 1 diabetes mellitus ([place]) [E10.10]   . DKA, type 1 ([place]) [E10.10] 11/23/2015  . Constipation [K59.00] 11/23/2015  .  Leukocytosis [D72.829]   . Pressure ulcer [L89.90] 08/02/2015  . Diabetic ketoacidosis with coma associated with other specified diabetes mellitus ([place]) [E13.11]   . Closed left ankle fracture [S82.892A] 07/01/2015  . Trimalleolar fracture of left ankle [S82.852A] 07/01/2015  . Hyperkalemia [E87.5]   . Insomnia [G47.00] 06/16/2015  . Normocytic anemia [D64.9] 01/31/2015  . Acute on chronic combined systolic and diastolic CHF (congestive heart failure) ([place]) [I50.43]   . Essential hypertension [I10]   . Elevated troponin [R79.89]   . Diabetes type 1, uncontrolled ([place]) [E10.65]   . Demand ischemia ([place]) [I24.8]   . S/P aortic valve replacement [Z95.4]   . Hypokalemia [E87.6]   . NSTEMI (non-ST elevated myocardial infarction) ([place]) [I21.4] 12/08/2014  . Blood poisoning ([place]) [A41.9]   . Supratherapeutic INR [R79.1]   . Severe sepsis with acute organ dysfunction ([place]) [A41.9, R65.20] 12/03/2014  . Spastic hemiplegia affecting nondominant side ([place]) [G81.10] 11/29/2014  . Dysphagia, pharyngoesophageal phase [R13.14] 10/04/2014  . Sinus tachycardia ([place]) [R00.0] 09/30/2014  . Depression [F32.9] 04/29/2014  . CVA (cerebral infarction) [I63.9] 03/12/2014  . Acute respiratory failure ([place]) [J96.00] 03/12/2014  . Chronic anticoagulation [Z79.01] 07/28/2013  . Long term (current) use of anticoagulants [Z79.01] 07/28/2013  . CAD (coronary artery disease) [I25.10]   . Aortic stenosis [I35.0]   . Hyperlipidemia [E78.5]   . Rheumatoid arthritis ([place]) [M06.9]     Total Time spent with patient: 1 hour  Subjective:   Madison [name] is a 55 y.o. female patient admitted with DKA, hyperglycemia, dizzy and history of fall at home.  HPI: Madison [name] is a 55 years old female admitted to [facility] with the diabetic ketoacidosis and has a history of  5 strokes per the last 23 years time. Patient reportedly living by herself and cannot care for her 30 years old adopted son which resulted  placing at her sister's home in [place]. Patient reported she has been suffering with insulin resistant diabetes mellitus and multiple infections and several acute inpatient admissions both here and [facility]. Patient reportedly stayed with her father until 4 years ago in [place] and then relocated to [place] to stay close to her sister. Patient does not believe her sister can care for her because her sister has at home multiple medical problems. Patient reported she was placed in a skilled nursing facility which was not helpful because her blood sugars has been flying high. Patient endorses being compliant with her insulin therapy but which is not keeping her blood sugars under control. Patient has a 41 years old son who lives in [place] with his girlfriend and reportedly calls her daily and talked to her and being supportive to moderately. Patient stated she does not expect more support from him. Patient endorses that she relays to she cannot stay by herself and has a plan of moving to [place] with her mother who is 103 years old and mom's husband. Patient has intact cognitions including orientation, memory, concentration and language functions. Sometimes difficult to calm understand because of mild slurred speech. Patient has never seen a psychiatrist but has been taking medication for depression. Patient has been placed on Celexa 40 mg daily for depression. Patient has knowledge about her medical conditions and also needed treatment. Patient reportedly compliant with her treatment recommendations.  Medical history: Patient is a 55 yo female with frequent admissions secondary to DKA with PMH of Type I DM, Mechanic aortic valve replacement (takes coumadin), CHF, CAD, Obesity, Hypercholesteremia, HTN, Dyslipidemia, Stroke syndrome, GERD, Anxiety, Depression, Rheumatoid arthritis, Aortic Stenosis, Thrombophlebitis, Heart Murmur, MI, and Pneumonia. She presented to [facility] ER on 05/17/2016 with  c/o hyperglycemia, onset 2 days ago blood sugars ranging between 400 to 500's. Associated symptoms include nausea and vomiting. She states she has been taking her insulin as prescribed 5 units of Novolog ac/hs and 10 units of Levimir daily in the am. She states she became dizzy last night and fell at home denies injury post fall. She has had increased bilateral lower extremity swelling onset 2 days ago unsure if she has had weight gain. She denies shortness of breath, fever, chills, diarrhea, or sick contacts. PCCM consulted 6/29 due to Type I DKA and hypotension may require pressors.  SUBJECTIVE: Transitioned off insulin drip overnight. Denies any dyspnea or cough. Denies any chest pain or pressure. Patient reports she is compliant with her home insulin regimen and monitors her carbohydrate intake.  Past Psychiatric History: Patient has been diagnosed with depression and also taking antidepressant medication Celexa 40 mg daily but has no history of acute psychiatric hospitalization or suicidal attempts.   Risk to Self: Is patient at risk for suicide?: No Risk to Others:   Prior Inpatient Therapy:   Prior Outpatient Therapy:    Past Medical History:  Past Medical History  Diagnosis Date  . CAD (coronary artery disease)   . Obesity   . Hypercholesteremia   . HTN (hypertension)   . Dyslipidemia   . Aortic stenosis   . Thrombophlebitis   . Heart murmur   . Myocardial infarction ([place]) 12/2014  . Pneumonia 11/2014; 12/2014  . GERD (gastroesophageal reflux disease)   . Stroke syndrome [facility]) 1995; 2001    "when my son was  born; problems w/speech and L hand since then" (01/19/2015)  . Anxiety   . Depression   . Allergy   . CHF (congestive heart failure) ([place])   . Rheumatoid arthritis(714.0)     "hands" (07/01/2015)  . Diabetes ([place]) dx'd 1982    "went straight to insulin"    Past Surgical History  Procedure Laterality Date  . Cesarean section  1995  . Foot fracture surgery    . Knee  arthroscopy Right   . Neuroplasty / transposition median nerve at carpal tunnel    . Left heart catheterization with coronary angiogram N/A 12/08/2014    Procedure: LEFT HEART CATHETERIZATION WITH CORONARY ANGIOGRAM;  Surgeon: Wellington Hampshire, MD;  Location: [facility] CATH LAB;  Service: Cardiovascular;  Laterality: N/A;  . Percutaneous coronary stent intervention (pci-s) N/A 12/09/2014    Procedure: PERCUTANEOUS CORONARY STENT INTERVENTION (PCI-S);  Surgeon: Leonie Man, MD;  Location: [facility] CATH LAB;  Service: Cardiovascular;  Laterality: N/A;  . Cardiac catheterization  12/08/2014  . Coronary angioplasty with stent placement  12/09/2014  . Aortic valve replacement (avr)/coronary artery bypass grafting (cabg)  "2014"    Archie Endo 03/08/2014  . Fracture surgery    . Tubal ligation  1995  . Cataract extraction Left   . Coronary artery bypass graft    . Orif ankle fracture Left 07/04/2015    Procedure: OPEN REDUCTION INTERNAL FIXATION (ORIF)  TRIMAL ANKLE FRACTURE;  Surgeon: Leandrew Koyanagi, MD;  Location: [facility];  Service: Orthopedics;  Laterality: Left;   Family History:  Family History  Problem Relation Age of Onset  . Stroke    . Heart disease Mother   . Heart disease Sister    Family Psychiatric  History: Patient has family history significant for diabetes but no known mental illness.  Social History:  History  Alcohol Use No     History  Drug Use No    Social History   Social History  . Marital Status: Divorced    Spouse Name: N/A  . Number of Children: N/A  . Years of Education: N/A   Social History Main Topics  . Smoking status: Never Smoker   . Smokeless tobacco: Never Used  . Alcohol Use: No  . Drug Use: No  . Sexual Activity: No   Other Topics Concern  . None   Social History Narrative   Divroced and lives at home.    Has two sons who live in [place] at this time. (21&8 y/o).    Has a cat    Walks a lot.       She is having great success with her diet       Working with  exercises that were given to her by PT/OT.    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     Allergies:   Allergies  Allergen Reactions  . Adhesive [Tape] Other (See Comments)    Can burn skin if left on too long  . Celebrex [Celecoxib] Rash  . Detrol [Tolterodine] Hives    Labs:  Results for orders placed or performed during the hospital encounter of 05/17/16 (from the past 48 hour(s))  CBG monitoring, ED     Status: Abnormal   Collection Time: 05/17/16  3:19 AM  Result Value Ref Range   Glucose-Capillary >600 (HH) 65 - 99 mg/dL  Comprehensive metabolic panel     Status: Abnormal  Collection Time: 05/17/16  3:40 AM  Result Value Ref Range   Sodium 127 (L) 135 - 145 mmol/L   Potassium 5.6 (H) 3.5 - 5.1 mmol/L   Chloride 93 (L) 101 - 111 mmol/L   CO2 <7 (L) 22 - 32 mmol/L   Glucose, Bld 764 (HH) 65 - 99 mg/dL    Comment: CRITICAL RESULT CALLED TO, READ BACK BY AND VERIFIED WITH: MIZE,S RN 05/17/2016 0530 JORDANS    BUN 14 6 - 20 mg/dL   Creatinine, Ser 1.65 (H) 0.44 - 1.00 mg/dL   Calcium 8.9 8.9 - 10.3 mg/dL   Total Protein 5.6 (L) 6.5 - 8.1 g/dL   Albumin 2.7 (L) 3.5 - 5.0 g/dL   AST 21 15 - 41 U/L   ALT 16 14 - 54 U/L   Alkaline Phosphatase 95 38 - 126 U/L   Total Bilirubin 1.6 (H) 0.3 - 1.2 mg/dL   GFR calc non Af Amer 34 (L) >60 mL/min   GFR calc Af Amer 39 (L) >60 mL/min    Comment: (NOTE) The eGFR has been calculated using the CKD EPI equation. This calculation has not been validated in all clinical situations. eGFR's persistently <60 mL/min signify possible Chronic Kidney Disease.    Anion gap NOT CALCULATED 5 - 15  CBC WITH DIFFERENTIAL     Status: Abnormal   Collection Time: 05/17/16  3:40 AM  Result Value Ref Range   WBC 18.0 (H) 4.0 - 10.5 K/uL   RBC 3.25 (L) 3.87 - 5.11 MIL/uL   Hemoglobin 9.3 (L) 12.0 - 15.0 g/dL    HCT 30.4 (L) 36.0 - 46.0 %   MCV 93.5 78.0 - 100.0 fL   MCH 28.6 26.0 - 34.0 pg   MCHC 30.6 30.0 - 36.0 g/dL   RDW 17.3 (H) 11.5 - 15.5 %   Platelets 369 150 - 400 K/uL   Neutrophils Relative % 83 %   Lymphocytes Relative 12 %   Monocytes Relative 5 %   Eosinophils Relative 0 %   Basophils Relative 0 %   Neutro Abs 14.9 (H) 1.7 - 7.7 K/uL   Lymphs Abs 2.2 0.7 - 4.0 K/uL   Monocytes Absolute 0.9 0.1 - 1.0 K/uL   Eosinophils Absolute 0.0 0.0 - 0.7 K/uL   Basophils Absolute 0.0 0.0 - 0.1 K/uL   RBC Morphology TEARDROP CELLS     Comment: BURR CELLS   WBC Morphology MILD LEFT SHIFT (1-5% METAS, OCC MYELO, OCC BANDS)   I-Stat CG4 Lactic Acid, ED  (not at  [facility])     Status: Abnormal   Collection Time: 05/17/16  3:43 AM  Result Value Ref Range   Lactic Acid, Venous 6.50 (HH) 0.5 - 1.9 mmol/L   Comment NOTIFIED PHYSICIAN   I-stat chem 8, ed     Status: Abnormal   Collection Time: 05/17/16  3:43 AM  Result Value Ref Range   Sodium 124 (L) 135 - 145 mmol/L   Potassium 5.5 (H) 3.5 - 5.1 mmol/L   Chloride 97 (L) 101 - 111 mmol/L   BUN 13 6 - 20 mg/dL   Creatinine, Ser 0.70 0.44 - 1.00 mg/dL   Glucose, Bld >700 (HH) 65 - 99 mg/dL   Calcium, Ion 1.12 (L) 1.13 - 1.30 mmol/L   TCO2 7 0 - 100 mmol/L   Hemoglobin 10.9 (L) 12.0 - 15.0 g/dL   HCT 32.0 (L) 36.0 - 46.0 %   Comment NOTIFIED PHYSICIAN   Urinalysis, Routine w reflex microscopic (  not at [facility])     Status: Abnormal   Collection Time: 05/17/16  3:53 AM  Result Value Ref Range   Color, Urine YELLOW YELLOW   APPearance CLEAR CLEAR   Specific Gravity, Urine 1.025 1.005 - 1.030   pH 5.0 5.0 - 8.0   Glucose, UA >1000 (A) NEGATIVE mg/dL   Hgb urine dipstick NEGATIVE NEGATIVE   Bilirubin Urine NEGATIVE NEGATIVE   Ketones, ur >80 (A) NEGATIVE mg/dL   Protein, ur NEGATIVE NEGATIVE mg/dL   Nitrite NEGATIVE NEGATIVE   Leukocytes, UA NEGATIVE NEGATIVE  Urine culture     Status: None   Collection Time: 05/17/16  3:53 AM  Result Value  Ref Range   Specimen Description URINE, CATHETERIZED    Special Requests NONE    Culture NO GROWTH    Report Status 05/18/2016 FINAL   Urine microscopic-add on     Status: Abnormal   Collection Time: 05/17/16  3:53 AM  Result Value Ref Range   Squamous Epithelial / LPF 0-5 (A) NONE SEEN   WBC, UA 0-5 0 - 5 WBC/hpf   RBC / HPF 0-5 0 - 5 RBC/hpf   Bacteria, UA RARE (A) NONE SEEN  CBG monitoring, ED     Status: Abnormal   Collection Time: 05/17/16  4:36 AM  Result Value Ref Range   Glucose-Capillary >600 (HH) 65 - 99 mg/dL  CBG monitoring, ED     Status: Abnormal   Collection Time: 05/17/16  5:08 AM  Result Value Ref Range   Glucose-Capillary >600 (HH) 65 - 99 mg/dL  CBG monitoring, ED     Status: Abnormal   Collection Time: 05/17/16  6:18 AM  Result Value Ref Range   Glucose-Capillary 551 (HH) 65 - 99 mg/dL  Protime-INR     Status: None   Collection Time: 05/17/16  6:27 AM  Result Value Ref Range   Prothrombin Time 15.0 11.6 - 15.2 seconds   INR 1.17 0.00 - 1.49  HIV antibody     Status: None   Collection Time: 05/17/16  6:27 AM  Result Value Ref Range   HIV Screen 4th Generation wRfx Non Reactive Non Reactive    Comment: (NOTE) Performed At: [facility] [place], [place] [id number] Lindon Romp MD [id number]   Basic metabolic panel     Status: Abnormal   Collection Time: 05/17/16  6:27 AM  Result Value Ref Range   Sodium 130 (L) 135 - 145 mmol/L   Potassium 4.6 3.5 - 5.1 mmol/L   Chloride 102 101 - 111 mmol/L   CO2 <7 (L) 22 - 32 mmol/L   Glucose, Bld 530 (HH) 65 - 99 mg/dL    Comment: CRITICAL RESULT CALLED TO, READ BACK BY AND VERIFIED WITH: S.MIZE,RN [id number] 05/17/16 CLARK,S    BUN 12 6 - 20 mg/dL   Creatinine, Ser 1.52 (H) 0.44 - 1.00 mg/dL   Calcium 8.1 (L) 8.9 - 10.3 mg/dL   GFR calc non Af Amer 38 (L) >60 mL/min   GFR calc Af Amer 43 (L) >60 mL/min    Comment: (NOTE) The eGFR has been calculated using the CKD EPI equation. This  calculation has not been validated in all clinical situations. eGFR's persistently <60 mL/min signify possible Chronic Kidney Disease.    Anion gap NOT CALCULATED 5 - 15  Lactic acid, plasma     Status: Abnormal   Collection Time: 05/17/16  6:27 AM  Result Value Ref Range   Lactic Acid, Venous  4.5 (HH) 0.5 - 1.9 mmol/L    Comment: CRITICAL RESULT CALLED TO, READ BACK BY AND VERIFIED WITH: S.MIZE,RN [id number] 05/17/16 CLARK,S   Troponin I     Status: None   Collection Time: 05/17/16  6:27 AM  Result Value Ref Range   Troponin I <0.03 <0.03 ng/mL  Procalcitonin - Baseline     Status: None   Collection Time: 05/17/16  6:27 AM  Result Value Ref Range   Procalcitonin 0.68 ng/mL    Comment:        Interpretation: PCT > 0.5 ng/mL and <= 2 ng/mL: Systemic infection (sepsis) is possible, but other conditions are known to elevate PCT as well. (NOTE)         ICU PCT Algorithm               Non ICU PCT Algorithm    ----------------------------     ------------------------------         PCT < 0.25 ng/mL                 PCT < 0.1 ng/mL     Stopping of antibiotics            Stopping of antibiotics       strongly encouraged.               strongly encouraged.    ----------------------------     ------------------------------       PCT level decrease by               PCT < 0.25 ng/mL       >= 80% from peak PCT       OR PCT 0.25 - 0.5 ng/mL          Stopping of antibiotics                                             encouraged.     Stopping of antibiotics           encouraged.    ----------------------------     ------------------------------       PCT level decrease by              PCT >= 0.25 ng/mL       < 80% from peak PCT        AND PCT >= 0.5 ng/mL             Continuing antibiotics                                              encouraged.       Continuing antibiotics            encouraged.    ----------------------------     ------------------------------     PCT level increase compared           PCT > 0.5 ng/mL         with peak PCT AND          PCT >= 0.5 ng/mL             Escalation of antibiotics  strongly encouraged.      Escalation of antibiotics        strongly encouraged.   CBG monitoring, ED     Status: Abnormal   Collection Time: 05/17/16  7:21 AM  Result Value Ref Range   Glucose-Capillary 416 (H) 65 - 99 mg/dL  I-Stat arterial blood gas, ED     Status: Abnormal   Collection Time: 05/17/16  8:02 AM  Result Value Ref Range   pH, Arterial 7.149 (LL) 7.350 - 7.450   pCO2 arterial 22.1 (L) 35.0 - 45.0 mmHg   pO2, Arterial 41.0 (L) 80.0 - 100.0 mmHg   Bicarbonate 7.7 (L) 20.0 - 24.0 mEq/L   TCO2 8 0 - 100 mmol/L   O2 Saturation 63.0 %   Acid-base deficit 19.0 (H) 0.0 - 2.0 mmol/L   Patient temperature 98.6 F    Collection site RADIAL, ALLEN'S TEST ACCEPTABLE    Drawn by RT    Sample type ARTERIAL    Comment NOTIFIED PHYSICIAN   CBG monitoring, ED     Status: Abnormal   Collection Time: 05/17/16  8:13 AM  Result Value Ref Range   Glucose-Capillary 351 (H) 65 - 99 mg/dL  Strep pneumoniae urinary antigen     Status: None   Collection Time: 05/17/16  8:17 AM  Result Value Ref Range   Strep Pneumo Urinary Antigen NEGATIVE NEGATIVE    Comment:        Infection due to S. pneumoniae cannot be absolutely ruled out since the antigen present may be below the detection limit of the test.   Urine rapid drug screen (hosp performed)not at [facility]     Status: None   Collection Time: 05/17/16  8:17 AM  Result Value Ref Range   Opiates NONE DETECTED NONE DETECTED   Cocaine NONE DETECTED NONE DETECTED   Benzodiazepines NONE DETECTED NONE DETECTED   Amphetamines NONE DETECTED NONE DETECTED   Tetrahydrocannabinol NONE DETECTED NONE DETECTED   Barbiturates NONE DETECTED NONE DETECTED    Comment:        DRUG SCREEN FOR MEDICAL PURPOSES ONLY.  IF CONFIRMATION IS NEEDED FOR ANY PURPOSE, NOTIFY LAB WITHIN 5 DAYS.        LOWEST  DETECTABLE LIMITS FOR URINE DRUG SCREEN Drug Class       Cutoff (ng/mL) Amphetamine      1000 Barbiturate      200 Benzodiazepine   606 Tricyclics       301 Opiates          300 Cocaine          300 THC              50   CBG monitoring, ED     Status: Abnormal   Collection Time: 05/17/16  9:13 AM  Result Value Ref Range   Glucose-Capillary 245 (H) 65 - 99 mg/dL  CBG monitoring, ED     Status: Abnormal   Collection Time: 05/17/16 10:32 AM  Result Value Ref Range   Glucose-Capillary 208 (H) 65 - 99 mg/dL  Glucose, capillary     Status: Abnormal   Collection Time: 05/17/16 11:37 AM  Result Value Ref Range   Glucose-Capillary 141 (H) 65 - 99 mg/dL  I-STAT 3, arterial blood gas (G3+)     Status: Abnormal   Collection Time: 05/17/16 12:12 PM  Result Value Ref Range   pH, Arterial 7.395 7.350 - 7.450   pCO2 arterial 24.2 (L) 35.0 - 45.0 mmHg   pO2,  Arterial 110.0 (H) 80.0 - 100.0 mmHg   Bicarbonate 14.9 (L) 20.0 - 24.0 mEq/L   TCO2 16 0 - 100 mmol/L   O2 Saturation 98.0 %   Acid-base deficit 9.0 (H) 0.0 - 2.0 mmol/L   Patient temperature 98.4 F    Collection site RADIAL, ALLEN'S TEST ACCEPTABLE    Drawn by Operator    Sample type ARTERIAL   Glucose, capillary     Status: Abnormal   Collection Time: 05/17/16 12:38 PM  Result Value Ref Range   Glucose-Capillary 118 (H) 65 - 99 mg/dL  Glucose, capillary     Status: Abnormal   Collection Time: 05/17/16  1:46 PM  Result Value Ref Range   Glucose-Capillary 108 (H) 65 - 99 mg/dL  Glucose, capillary     Status: Abnormal   Collection Time: 05/17/16  2:47 PM  Result Value Ref Range   Glucose-Capillary 112 (H) 65 - 99 mg/dL  Glucose, capillary     Status: Abnormal   Collection Time: 05/17/16  3:54 PM  Result Value Ref Range   Glucose-Capillary 117 (H) 65 - 99 mg/dL  Glucose, capillary     Status: Abnormal   Collection Time: 05/17/16  5:15 PM  Result Value Ref Range   Glucose-Capillary 125 (H) 65 - 99 mg/dL  Glucose, capillary      Status: Abnormal   Collection Time: 05/17/16  6:50 PM  Result Value Ref Range   Glucose-Capillary 233 (H) 65 - 99 mg/dL  Basic metabolic panel     Status: Abnormal   Collection Time: 05/17/16  7:33 PM  Result Value Ref Range   Sodium 131 (L) 135 - 145 mmol/L   Potassium 4.2 3.5 - 5.1 mmol/L   Chloride 109 101 - 111 mmol/L   CO2 13 (L) 22 - 32 mmol/L   Glucose, Bld 306 (H) 65 - 99 mg/dL   BUN 5 (L) 6 - 20 mg/dL   Creatinine, Ser 0.84 0.44 - 1.00 mg/dL   Calcium 7.6 (L) 8.9 - 10.3 mg/dL   GFR calc non Af Amer >60 >60 mL/min   GFR calc Af Amer >60 >60 mL/min    Comment: (NOTE) The eGFR has been calculated using the CKD EPI equation. This calculation has not been validated in all clinical situations. eGFR's persistently <60 mL/min signify possible Chronic Kidney Disease.    Anion gap 9 5 - 15  Magnesium     Status: Abnormal   Collection Time: 05/17/16  7:33 PM  Result Value Ref Range   Magnesium 1.5 (L) 1.7 - 2.4 mg/dL  Phosphorus     Status: Abnormal   Collection Time: 05/17/16  7:35 PM  Result Value Ref Range   Phosphorus 1.9 (L) 2.5 - 4.6 mg/dL  Heparin level (unfractionated)     Status: None   Collection Time: 05/17/16  7:36 PM  Result Value Ref Range   Heparin Unfractionated 0.53 0.30 - 0.70 IU/mL    Comment:        IF HEPARIN RESULTS ARE BELOW EXPECTED VALUES, AND PATIENT DOSAGE HAS BEEN CONFIRMED, SUGGEST FOLLOW UP TESTING OF ANTITHROMBIN III LEVELS.   Lactic acid, plasma     Status: None   Collection Time: 05/17/16  7:37 PM  Result Value Ref Range   Lactic Acid, Venous 0.9 0.5 - 1.9 mmol/L  Glucose, capillary     Status: Abnormal   Collection Time: 05/17/16  8:28 PM  Result Value Ref Range   Glucose-Capillary 218 (H) 65 - 99 mg/dL  Glucose, capillary     Status: Abnormal   Collection Time: 05/17/16 10:08 PM  Result Value Ref Range   Glucose-Capillary 109 (H) 65 - 99 mg/dL  Protime-INR     Status: Abnormal   Collection Time: 05/18/16  5:39 AM  Result  Value Ref Range   Prothrombin Time 15.4 (H) 11.6 - 15.2 seconds   INR 1.20 0.00 - 1.49  CBC     Status: Abnormal   Collection Time: 05/18/16  5:39 AM  Result Value Ref Range   WBC 8.4 4.0 - 10.5 K/uL   RBC 3.02 (L) 3.87 - 5.11 MIL/uL   Hemoglobin 8.5 (L) 12.0 - 15.0 g/dL    Comment: DELTA CHECK NOTED REPEATED TO VERIFY    HCT 26.8 (L) 36.0 - 46.0 %   MCV 88.7 78.0 - 100.0 fL   MCH 28.1 26.0 - 34.0 pg   MCHC 31.7 30.0 - 36.0 g/dL   RDW 16.8 (H) 11.5 - 15.5 %   Platelets 311 150 - 400 K/uL  Magnesium     Status: None   Collection Time: 05/18/16  5:39 AM  Result Value Ref Range   Magnesium 2.3 1.7 - 2.4 mg/dL  Phosphorus     Status: None   Collection Time: 05/18/16  5:39 AM  Result Value Ref Range   Phosphorus 2.7 2.5 - 4.6 mg/dL  Heparin level (unfractionated)     Status: None   Collection Time: 05/18/16  5:40 AM  Result Value Ref Range   Heparin Unfractionated 0.46 0.30 - 0.70 IU/mL    Comment:        IF HEPARIN RESULTS ARE BELOW EXPECTED VALUES, AND PATIENT DOSAGE HAS BEEN CONFIRMED, SUGGEST FOLLOW UP TESTING OF ANTITHROMBIN III LEVELS.   Basic metabolic panel     Status: Abnormal   Collection Time: 05/18/16  7:28 AM  Result Value Ref Range   Sodium 136 135 - 145 mmol/L   Potassium 3.5 3.5 - 5.1 mmol/L   Chloride 109 101 - 111 mmol/L   CO2 19 (L) 22 - 32 mmol/L   Glucose, Bld 69 65 - 99 mg/dL   BUN <5 (L) 6 - 20 mg/dL   Creatinine, Ser 0.75 0.44 - 1.00 mg/dL   Calcium 7.9 (L) 8.9 - 10.3 mg/dL   GFR calc non Af Amer >60 >60 mL/min   GFR calc Af Amer >60 >60 mL/min    Comment: (NOTE) The eGFR has been calculated using the CKD EPI equation. This calculation has not been validated in all clinical situations. eGFR's persistently <60 mL/min signify possible Chronic Kidney Disease.    Anion gap 8 5 - 15  Glucose, capillary     Status: None   Collection Time: 05/18/16  8:25 AM  Result Value Ref Range   Glucose-Capillary 65 65 - 99 mg/dL  Glucose, capillary      Status: Abnormal   Collection Time: 05/18/16  9:31 AM  Result Value Ref Range   Glucose-Capillary 111 (H) 65 - 99 mg/dL  Glucose, capillary     Status: Abnormal   Collection Time: 05/18/16 12:06 PM  Result Value Ref Range   Glucose-Capillary 156 (H) 65 - 99 mg/dL   Comment 1 Notify RN     Current Facility-Administered Medications  Medication Dose Route Frequency Provider Last Rate Last Dose  . 0.9 %  sodium chloride infusion   Intravenous Continuous Rise Patience, MD 100 mL/hr at 05/18/16 0855    . ceFEPIme (MAXIPIME) 2 g in dextrose 5 %  50 mL IVPB  2 g Intravenous Q12H Wynell Balloon, RPH      . citalopram (CELEXA) tablet 40 mg  40 mg Oral Daily Awilda Bill, NP   40 mg at 05/18/16 4076  . famotidine (PEPCID) tablet 40 mg  40 mg Oral QHS Javier Glazier, MD      . heparin ADULT infusion 100 units/mL (25000 units/211m sodium chloride 0.45%)  850 Units/hr Intravenous Continuous MEudelia Bunch RPH 8.5 mL/hr at 05/18/16 0800 850 Units/hr at 05/18/16 0800  . insulin aspart (novoLOG) injection 0-15 Units  0-15 Units Subcutaneous TID WC DRaylene Miyamoto MD   3 Units at 05/18/16 1200  . insulin aspart (novoLOG) injection 0-5 Units  0-5 Units Subcutaneous QHS DRaylene Miyamoto MD   0 Units at 05/17/16 2206  . insulin glargine (LANTUS) injection 10 Units  10 Units Subcutaneous Daily DRaylene Miyamoto MD   10 Units at 05/17/16 2203  . ondansetron (ZOFRAN) injection 4 mg  4 mg Intravenous Q6H PRN DAwilda Bill NP      . phenylephrine (NEO-SYNEPHRINE) 10 mg in dextrose 5 % 250 mL (0.04 mg/mL) infusion  0-400 mcg/min Intravenous Titrated DAwilda Bill NP   Stopped at 05/18/16 0400  . pravastatin (PRAVACHOL) tablet 20 mg  20 mg Oral Daily ARise Patience MD   20 mg at 05/18/16 08088 . vancomycin (VANCOCIN) IVPB 750 mg/150 ml premix  750 mg Intravenous Q12H [facility]ynell Balloon [facility]       Musculoskeletal: Strength & Muscle Tone: decreased Gait & Station: unable to  stand Patient leans: N/A  Psychiatric Specialty Exam: Physical Exam  ROS patient endorses being depressed and also acknowledges deterioration of her health condition secondary to uncontrollable fluctuations of diabetes and also multiple infections in and out of the hospitalizations. No Fever-chills, No Headache, No changes with Vision or hearing, reports vertigo No problems swallowing food or Liquids, No Chest pain, Cough or Shortness of Breath, No Abdominal pain, No Nausea or Vommitting, Bowel movements are regular, No Blood in stool or Urine, No dysuria, No new skin rashes or bruises, No new joints pains-aches,  No new weakness, tingling, numbness in any extremity, No recent weight gain or loss, No polyuria, polydypsia or polyphagia,   A full 10 point Review of Systems was done, except as stated above, all other Review of Systems were negative.  Blood pressure 118/56, pulse 71, temperature 98.1 F (36.7 C), temperature source Oral, resp. rate 18, height '5\' 3"'  (1.6 m), weight 66.951 kg (147 lb 9.6 oz), SpO2 100 %.Body mass index is 26.15 kg/(m^2).  General Appearance: Guarded  Eye Contact:  Good  Speech:  Clear and Coherent and mild slurred or dysarthria  Volume:  Normal  Mood:  Anxious and Depressed  Affect:  Appropriate and Congruent  Thought Process:  Coherent and Goal Directed  Orientation:  Full (Time, Place, and Person)  Thought Content:  WDL  Suicidal Thoughts:  No  Homicidal Thoughts:  No  Memory:  Immediate;   Good Recent;   Good Remote;   Good  Judgement:  Fair  Insight:  Fair  Psychomotor Activity:  Decreased  Concentration:  Concentration: Good and Attention Span: Good  Recall:  Good  Fund of Knowledge:  Good  Language:  Good  Akathisia:  Negative  Handed:  Right  AIMS (if indicated):     Assets:  Communication Skills Desire for Improvement Financial Resources/Insurance Housing Leisure Time Resilience Transportation  ADL's:  Intact  Cognition:  WNL   Sleep:        Treatment Plan Summary: Patient denied active symptoms of depression, anxiety, mania, psychosis and also denied active suicidal/homicidal ideation, intention or plans. Patient has intact cognitions including orientation, concentration, immediate and delayed memory and language functions. Patient has a mild dysarthria and occasional slurred speech probably secondary to history of multiple strokes.  Patient has understanding about insulin resistant diabetes mellitus and having more frequent infections which is still causing deterioration of her health. Patient recognizes she needed supportive environment and need to stay with the other person and cannot stand alone. Patient reportedly does not want to be placed in the assisted living facility/skilled nursing facility instead she would like to go on and stay with her mom and her husband in [place].   It is not clear how her family feel about that and also reported they are, so will ask unit social worker to contact family members regarding collateral information about her placement issues  Based on my evaluation patient does meet criteria for capacity to make her own medical decisions and also living arrangements.    Disposition: Patient benefit from short stay of assisted living facility or skilled nursing facility until she make arrangements to stay with her family. Patient does not meet criteria for psychiatric inpatient admission. Supportive therapy provided about ongoing stressors.  Ambrose Finland, MD 05/18/2016 1:19 PM

## 2016-05-18 NOTE — Progress Notes (Signed)
Pharmacy Antibiotic Note  Madison [name] is a 55 y.o. female admitted on 05/17/2016 with pneumonia.  Pharmacy has been consulted for Vancomycin and Cefepime dosing x 8 days.  Renal fx now back to baseline - DKA resolved  Plan: Cefepime 2g q12h x 8 days Vancomycin 750mg  q12h x 8 days Will f/u micro data, renal function, vt prn   Weight: 146 lb 6.2 oz (66.4 kg)  Temp (24hrs), Avg:97.9 F (36.6 C), Min:97.5 F (36.4 C), Max:98.4 F (36.9 C)   Recent Labs Lab 05/11/16 1348  05/12/16 0500 05/13/16 0336 05/14/16 0645 05/17/16 0340 05/17/16 0343 05/17/16 0627 05/17/16 1933 05/17/16 1937 05/18/16 0539 05/18/16 0728  WBC  --   --  7.4 6.2 4.1 18.0*  --   --   --   --  8.4  --   CREATININE 0.55  < > 0.58 0.52 0.53 1.65* 0.70 1.52* 0.84  --   --  0.75  LATICACIDVEN 0.9  --   --   --   --   --  6.50* 4.5*  --  0.9  --   --   < > = values in this interval not displayed.  Estimated Creatinine Clearance: 72.8 mL/min (by C-G formula based on Cr of 0.75).    Allergies  Allergen Reactions  . Adhesive [Tape] Other (See Comments)    Can burn skin if left on too long  . Celebrex [Celecoxib] Rash  . Detrol [Tolterodine] Hives    Antimicrobials this admission: 6/29 Vanc >>  6/29 Cefepime >>   Dose adjustments this admission: n/a  Microbiology results: 6/29 BCx x2:  6/29 UCx:    Sputum:    7/29, PharmD, BCPS, [facility] Clinical Pharmacist Pager [phone] 05/18/2016 9:37 AM

## 2016-05-18 NOTE — Progress Notes (Signed)
Results for TRINKA, KESHISHYAN (MRN [id number]) as of 05/18/2016 10:48  Ref. Range 05/17/2016 18:50 05/17/2016 20:28 05/17/2016 22:08 05/18/2016 08:25 05/18/2016 09:31  Glucose-Capillary Latest Ref Range: 65-99 mg/dL 408 (H) 144 (H) 818 (H) 65 111 (H)  Noted that CBG down to 65 mg/dl. Recommend decreasing Novolog correction scale to SENSITIVE since blood sugars have been below 70 mg/dl. Smith Mince RN BSN CDE

## 2016-05-18 NOTE — Progress Notes (Signed)
PULMONARY / CRITICAL CARE MEDICINE   Name: Madison [name] MRN: [id number] DOB: [birth year]    ADMISSION DATE:  05/17/2016 CONSULTATION DATE:  05/17/2016  REFERRING MD:  Dr. Jomarie Longs  CHIEF COMPLAINT:  Hyperglycemia and Hypotension  HISTORY OF PRESENT ILLNESS:   This is a 55 yo female with frequent admissions secondary to DKA with PMH of Type I DM, Mechanic aortic valve replacement (takes coumadin), CHF, CAD, Obesity, Hypercholesteremia, HTN, Dyslipidemia, Stroke syndrome, GERD, Anxiety, Depression, Rheumatoid arthritis, Aortic Stenosis, Thrombophlebitis, Heart Murmur, MI, and Pneumonia.  She presented to [facility] ER on 05/17/2016 with c/o hyperglycemia, onset 2 days ago blood sugars ranging between 400 to 500's.  Associated symptoms include nausea and vomiting. She states she has been taking her insulin as prescribed 5 units of Novolog ac/hs and 10 units of Levimir daily in the am.  She states she became dizzy last night and fell at home denies injury post fall.  She has had increased bilateral lower extremity swelling onset 2 days ago unsure if she has had weight gain.  She denies shortness of breath, fever, chills, diarrhea, or sick contacts.  PCCM consulted 6/29 due to Type I DKA and hypotension may require pressors.  SUBJECTIVE: Transitioned off insulin drip overnight. Denies any dyspnea or cough. Denies any chest pain or pressure. Patient reports she is compliant with her home insulin regimen and monitors her carbohydrate intake.  REVIEW OF SYSTEMS: Denies any subjective fever, chills, or sweats. Denies any abdominal pain, nausea, or emesis.  VITAL SIGNS: BP 123/74 mmHg  Pulse 64  Temp(Src) 97.7 F (36.5 C) (Axillary)  Resp 21  Wt 66.4 kg (146 lb 6.2 oz)  SpO2 100%  HEMODYNAMICS:    VENTILATOR SETTINGS:    INTAKE / OUTPUT: I/O last 3 completed shifts: In: 1810.1 [I.V.:1460.1; IV Piggyback:350] Out: 875 [Urine:875]  PHYSICAL EXAMINATION: General: Female. No distress. Awake  watching TV. Neuro:  Following commands. Oriented 3. Cranial nerves grossly intact. HEENT:  Moist mucous membranes. No scleral icterus. Pupils symmetric. Cardiovascular: Regular rate. Bilateral lower extremity edema. No appreciable JVD. Lungs:  Clear to auscultation bilaterally. Normal work of breathing on room air. Speaking in complete sentences.  Abdomen:  Soft. Nontender. Normal bowel sounds.  Musculoskeletal:  No joint deformity or effusion appreciated.  Skin:  Warm and dry. No rash on exposed skin.  LABS:  BMET  Recent Labs Lab 05/17/16 0340 05/17/16 0343 05/17/16 0627 05/17/16 1933  NA 127* 124* 130* 131*  K 5.6* 5.5* 4.6 4.2  CL 93* 97* 102 109  CO2 <7*  --  <7* 13*  BUN 14 13 12  5*  CREATININE 1.65* 0.70 1.52* 0.84  GLUCOSE 764* >700* 530* 306*    Electrolytes  Recent Labs Lab 05/12/16 0500 05/13/16 0336  05/17/16 0340 05/17/16 0627 05/17/16 1933 05/17/16 1935 05/18/16 0539  CALCIUM 7.5* 7.6*  < > 8.9 8.1* 7.6*  --   --   MG 1.6* 2.1  --   --   --  1.5*  --  2.3  PHOS 1.6*  --   --   --   --   --  1.9* 2.7  < > = values in this interval not displayed.  CBC  Recent Labs Lab 05/13/16 0336 05/14/16 0645 05/17/16 0340 05/17/16 0343  WBC 6.2 4.1 18.0*  --   HGB 8.8* 8.7* 9.3* 10.9*  HCT 26.2* 26.3* 30.4* 32.0*  PLT 205 182 369  --     Coag's  Recent Labs Lab 05/14/16 0645 05/17/16  5784 05/18/16 0539  INR 1.63* 1.17 1.20    Sepsis Markers  Recent Labs Lab 05/17/16 0343 05/17/16 0627 05/17/16 1937  LATICACIDVEN 6.50* 4.5* 0.9  PROCALCITON  --  0.68  --     ABG  Recent Labs Lab 05/17/16 0802 05/17/16 1212  PHART 7.149* 7.395  PCO2ART 22.1* 24.2*  PO2ART 41.0* 110.0*    Liver Enzymes  Recent Labs Lab 05/12/16 0500 05/13/16 0336 05/17/16 0340  AST  --  34 21  ALT  --  17 16  ALKPHOS  --  75 95  BILITOT  --  0.7 1.6*  ALBUMIN 1.8* 2.1* 2.7*    Cardiac Enzymes  Recent Labs Lab 05/11/16 1348 05/11/16 2200  05/17/16 0627  TROPONINI 0.33* 0.25* <0.03    Glucose  Recent Labs Lab 05/17/16 1447 05/17/16 1554 05/17/16 1715 05/17/16 1850 05/17/16 2028 05/17/16 2208  GLUCAP 112* 117* 125* 233* 218* 109*    Imaging No results found.   STUDIES:  Echo 6/14: EF 60% to 65% Port CXR 6/29:  Hazy opacity RUL.  MICROBIOLOGY: Urine Ctx 6/29>> Blood Ctx x2 6/29>> HIV 6/29:  Nonreactive  Urine Strep Ag 6/29:  Negative  Urine Legionella Ag 6/29>>  ANTIBIOTICS: Cefepime 6/29>> Vancomycin 6/29>>  SIGNIFICANT EVENTS: 5/08 - 5/16 - Admit w/ DKA 6/12 - 6/20 - Admit w/ DKA 6/22 - 6/26 - Admit w/ DKA 6/29 - Admit with DKA  LINES/TUBES: PIV x2  ASSESSMENT / PLAN:  PULMONARY A: Acute Hypoxic Respiratory Failure - Aspiration Pneumonia vs HCAP. Resolved.  P:   Wean FiO2 for Sat >92% Continuous pulse ox  CARDIOVASCULAR A:  Shock - Likely hypovolemia. Resolved. H/O CAD H/O CHF H/O HTN H/O Hypercholesteremia H/O Aortic Stenosis s/p AVR  P:  Monitor on telemetry Vitals per unit protocol Holding Coumadin Heparin Drip per pharmacy protocol  RENAL A:   Acute Renal Failure - Resolved. Pseudo-Hyponatremia - Improving. AGMA - Secondary to DKA. Resolved. Lactic Acidosis - Resolved.  P:   Trending UOP Monitoring electrolytes & renal function daily Replace electrolytes as indicated  GASTROINTESTINAL A:   Nausea - Likely secondary to DKA. H/O GERD   P:   Carb Modified Diet Zofran IV prn Pepcid po QHS D/C IV Pepcid  HEMATOLOGIC A:   Anemia - No signs of active bleeding.  Anticoagulation - For AVR. Chronic Coumadin. INR subtherapeutic. Leukocytosis - Resolved. Reactive vs hemoconcentration.  P:  Trend cell counts daily w/ CBC Transfuse for Hgb <7.0 Heparin gtt per pharmacy protocol Daily INR  INFECTIOUS A:   Possible Sepsis Aspiration Pneumonia vs CAP  P:   Empiric Vancomycin & Cefepime Day #2 Follow Cultures Trending Procalcitonin per  algorithm Re-culture for fever  ENDOCRINE A:   DKA H/O DM Type 1 - Repeated admissions for DKA.  P:   Accu-Checks qAC & HS SSI per Moderate Algorithm Lantus 10u [place] qhs  NEUROLOGIC A:   Lack of insight into disease. H/O Stroke syndrome with aphasia H/O Anxiety/Depression   P:   Consulted Psychiatry to help assess competence/safety for home discharge Continue outpatient Celexa Hold outpatient Trazodone for now Monitor mentation    FAMILY  - Updates: Pt updated about plan of care and questions answered  - Inter-disciplinary family meet or Palliative Care meeting due by:  05/25/2016   TODAY'S SUMMARY:  55 yo female with frequent admissions due to DKA presenting with the same. Patient has transitioned off her insulin infusion and anion gap has closed. She is tolerating a diet. I am concerned  with the patient lacks insight into her disease and the severity with her frequent admissions. She reports she is compliant with her home medication regimen as well as dietary monitoring for carbohydrate restriction. Patient's hypoxia has resolved as well. Continuing broad-spectrum antibiotics now empirically for possible sepsis while awaiting further culture results. Psychiatry has been consulted for an assessment. Given the patient's continued clinical improvement I will transition her out of the intensive care unit today. TRH to assume care & PCCM will sign off as of 7/1.  Athaliah Christen Jamison Neighbor, M.D. [facility] Pulmonary & Critical Care Pager:  [phone] After 3pm or if no response, call [phone] 6:39 AM 05/18/2016

## 2016-05-18 NOTE — Progress Notes (Signed)
Hypoglycemic Event  CBG: 65  Treatment: 8oz Apple Juice and Breakfast tray  Symptoms: None  Follow-up CBG: Time: 0930 CBG Result:111  Possible Reasons for Event: Inadequate Meal Intake; Medication   Comments/MD notified: Dr Jamison Neighbor Notified; Insulin adjusted    Madison [name]

## 2016-05-18 NOTE — Progress Notes (Signed)
ANTICOAGULATION CONSULT NOTE - Follow Up Consult  Pharmacy Consult for heparin Indication: mechanical aortic valve  Allergies  Allergen Reactions  . Adhesive [Tape] Other (See Comments)    Can burn skin if left on too long  . Celebrex [Celecoxib] Rash  . Detrol [Tolterodine] Hives    Patient Measurements: Weight: 146 lb 6.2 oz (66.4 kg) Heparin Dosing Weight: 66.4 kg  Vital Signs: Temp: 97.5 F (36.4 C) (06/30 0827) Temp Source: Oral (06/30 0827) BP: 134/105 mmHg (06/30 0800) Pulse Rate: 59 (06/30 0800)  Labs:  Recent Labs  05/17/16 0340 05/17/16 0343 05/17/16 0627 05/17/16 1933 05/17/16 1936 05/18/16 0539 05/18/16 0540 05/18/16 0728  HGB 9.3* 10.9*  --   --   --  8.5*  --   --   HCT 30.4* 32.0*  --   --   --  26.8*  --   --   PLT 369  --   --   --   --  311  --   --   LABPROT  --   --  15.0  --   --  15.4*  --   --   INR  --   --  1.17  --   --  1.20  --   --   HEPARINUNFRC  --   --   --   --  0.53  --  0.46  --   CREATININE 1.65* 0.70 1.52* 0.84  --   --   --  0.75  TROPONINI  --   --  <0.03  --   --   --   --   --     Estimated Creatinine Clearance: 72.8 mL/min (by C-G formula based on Cr of 0.75).   Medications:  Scheduled:  . ceFEPime (MAXIPIME) IV  2 g Intravenous Q24H  . citalopram  40 mg Oral Daily  . famotidine  40 mg Oral QHS  . insulin aspart  0-15 Units Subcutaneous TID WC  . insulin aspart  0-5 Units Subcutaneous QHS  . insulin glargine  10 Units Subcutaneous Daily  . pravastatin  20 mg Oral Daily  . vancomycin  1,000 mg Intravenous Q24H   Infusions:  . sodium chloride 100 mL/hr at 05/18/16 0855  . heparin 850 Units/hr (05/18/16 0800)  . phenylephrine (NEO-SYNEPHRINE) Adult infusion Stopped (05/18/16 0400)    Assessment: 55 yo f presenting with hyperglycemia and hypotension. Frequent admissions with most recent a d/c from [facility] on 6/26  PMH: DKA/DM1, AVR on warfarin, CHF, CAD, HLD, HTN, RA  AC: warfarin pta for AVR. Holding now to  heparin gtt on 850 units/hr with therapeutic lvl x 2  Home warfarin 7.5 mg daily (Goal 2.5 - 3.5)   CV: hypovolemic shock vs. Sepsis. - vitals now improved, neo weaned off  Nephro: acidosis d/t DKA. SCr 0.75  Heme: H&H 8.5/26.8, Plt 311  Goal of Therapy:  Heparin level 0.3-0.7 units/ml Monitor platelets by anticoagulation protocol: Yes   Plan:  Heparin drip 850 units/hr  Daily HL/CBC  Isaac Bliss, PharmD, BCPS, [facility] Clinical Pharmacist Pager [phone] 05/18/2016 9:33 AM

## 2016-05-18 NOTE — Progress Notes (Signed)
CSW contact AC at [facility] re: stat psych consult to determine competency for Patient. CSW was transferred to [place] at [facility] who reports that Pt. Was not previously on the list despite consult being placed by RN on yesterday. Idalia Needle advised that nursing staff call [facility] daily when they have psych consults to ensure that Patient is on the list to be seen. Patient to be seen by Dr. Elsie Saas on today.      Lance Muss, LCSW [facility] ED/2M Clinical Social Worker [phone]

## 2016-05-18 NOTE — Care Management (Addendum)
8:43 am CM contacted unit to request attending to contact pych for STAT consult for competence (ordered yesterday before noon).  Pt is now off insulin drip and could possibly discharge this weekend.  CM also contacted physician advisor and CSW in anticipation for weekend discharge.    1:45pm Per Physician Advisor;  pt can not be forced to discharge to SNF regardless if deemed incompetent.  Advisor is familiar with pt/case/circumstances and does not believe that [facility] would benefit pt.  Discharge plan to include home if continues to refuse SNF.  Pt transferred to new unit, new CM made aware.

## 2016-05-19 ENCOUNTER — Inpatient Hospital Stay (HOSPITAL_COMMUNITY): Payer: Commercial Managed Care - HMO

## 2016-05-19 LAB — CBC WITH DIFFERENTIAL/PLATELET
BASOS PCT: 0 %
Basophils Absolute: 0 10*3/uL (ref 0.0–0.1)
EOS ABS: 0 10*3/uL (ref 0.0–0.7)
Eosinophils Relative: 1 %
HCT: 28.5 % — ABNORMAL LOW (ref 36.0–46.0)
HEMOGLOBIN: 9.2 g/dL — AB (ref 12.0–15.0)
LYMPHS ABS: 2.1 10*3/uL (ref 0.7–4.0)
Lymphocytes Relative: 45 %
MCH: 28.3 pg (ref 26.0–34.0)
MCHC: 32.3 g/dL (ref 30.0–36.0)
MCV: 87.7 fL (ref 78.0–100.0)
MONO ABS: 0.4 10*3/uL (ref 0.1–1.0)
Monocytes Relative: 8 %
NEUTROS ABS: 2.2 10*3/uL (ref 1.7–7.7)
Neutrophils Relative %: 46 %
PLATELETS: 257 10*3/uL (ref 150–400)
RBC: 3.25 MIL/uL — ABNORMAL LOW (ref 3.87–5.11)
RDW: 17 % — AB (ref 11.5–15.5)
WBC: 4.7 10*3/uL (ref 4.0–10.5)

## 2016-05-19 LAB — GLUCOSE, CAPILLARY
GLUCOSE-CAPILLARY: 26 mg/dL — AB (ref 65–99)
GLUCOSE-CAPILLARY: 29 mg/dL — AB (ref 65–99)
GLUCOSE-CAPILLARY: 130 mg/dL — AB (ref 65–99)
GLUCOSE-CAPILLARY: 129 mg/dL — AB (ref 65–99)
Glucose-Capillary: 44 mg/dL — CL (ref 65–99)
Glucose-Capillary: 74 mg/dL (ref 65–99)
Glucose-Capillary: 119 mg/dL — ABNORMAL HIGH (ref 65–99)
Glucose-Capillary: 211 mg/dL — ABNORMAL HIGH (ref 65–99)

## 2016-05-19 LAB — MAGNESIUM: Magnesium: 1.8 mg/dL (ref 1.7–2.4)

## 2016-05-19 LAB — RENAL FUNCTION PANEL
ALBUMIN: 2 g/dL — AB (ref 3.5–5.0)
Anion gap: 6 (ref 5–15)
BUN: <5 mg/dL — ABNORMAL LOW (ref 6–20)
CALCIUM: 8.3 mg/dL — AB (ref 8.9–10.3)
CO2: 22 mmol/L (ref 22–32)
CREATININE: 0.57 mg/dL (ref 0.44–1.00)
Chloride: 109 mmol/L (ref 101–111)
GFR calc Af Amer: >60 mL/min (ref >60)
GFR calc non Af Amer: >60 mL/min (ref >60)
GLUCOSE: 116 mg/dL — AB (ref 65–99)
PHOSPHORUS: 2.3 mg/dL — AB (ref 2.5–4.6)
Potassium: 4.7 mmol/L (ref 3.5–5.1)
SODIUM: 137 mmol/L (ref 135–145)

## 2016-05-19 LAB — HEPARIN LEVEL (UNFRACTIONATED): Heparin Unfractionated: 0.31 IU/mL (ref 0.30–0.70)

## 2016-05-19 LAB — PROTIME-INR
INR: 1.16 (ref 0.00–1.49)
PROTHROMBIN TIME: 15 s (ref 11.6–15.2)

## 2016-05-19 LAB — PROCALCITONIN: PROCALCITONIN: 0.74 ng/mL

## 2016-05-19 MED ORDER — TRAZODONE HCL 50 MG PO TABS: 25.0000 mg | ORAL_TABLET | Freq: Once | ORAL | Status: DC

## 2016-05-19 MED ORDER — TRAZODONE HCL 50 MG PO TABS
50.0000 mg | ORAL_TABLET | Freq: Once | ORAL | Status: AC
  Administered 2016-05-19: 50 mg via ORAL
  Filled 2016-05-19: qty 1

## 2016-05-19 MED ORDER — ACETAMINOPHEN 325 MG PO TABS
650.0000 mg | ORAL_TABLET | Freq: Once | ORAL | Status: AC
  Administered 2016-05-19: 650 mg via ORAL
  Filled 2016-05-19: qty 2

## 2016-05-19 MED ORDER — WARFARIN SODIUM 5 MG PO TABS
11.0000 mg | ORAL_TABLET | Freq: Once | ORAL | Status: AC
  Administered 2016-05-19: 11 mg via ORAL
  Filled 2016-05-19: qty 2

## 2016-05-19 MED ORDER — INSULIN GLARGINE 100 UNIT/ML ~~LOC~~ SOLN
5.0000 [IU] | Freq: Every day | SUBCUTANEOUS | Status: DC
  Administered 2016-05-19 – 2016-05-23 (×5): 5 [IU] via SUBCUTANEOUS
  Filled 2016-05-19 (×6): qty 0.05

## 2016-05-19 MED ORDER — WARFARIN - PHARMACIST DOSING INPATIENT
Freq: Every day | Status: DC
  Administered 2016-05-19 – 2016-05-22 (×3)
  Administered 2016-05-23: 1

## 2016-05-19 MED ORDER — FUROSEMIDE 10 MG/ML IJ SOLN
20.0000 mg | Freq: Once | INTRAMUSCULAR | Status: AC
  Administered 2016-05-19: 20 mg via INTRAVENOUS
  Filled 2016-05-19: qty 2

## 2016-05-19 MED ORDER — PANTOPRAZOLE SODIUM 40 MG PO TBEC
40.0000 mg | DELAYED_RELEASE_TABLET | Freq: Every day | ORAL | Status: DC
  Administered 2016-05-19 – 2016-05-24 (×6): 40 mg via ORAL
  Filled 2016-05-19 (×6): qty 1

## 2016-05-19 NOTE — Progress Notes (Signed)
PROGRESS NOTE    Madison [name]  ION:[id number] DOB: Jul 26, 1961 DOA: 05/17/2016 PCP: [name], NP  Brief Narrative:Tasheika ADDILEE [name] is a 55 y.o. female with diabetes mellitus type 1, mechanical aortic valve, CAD status post CABG who was discharged 2 days ago was found to be hypotensive by patient's home health aide. Patient was referred to the ER. The ER patient's blood pressure was in the 80 systolic with heart rate around 130 beats per minute. Patient's lab work show markedly elevated blood sugar with bicarbonate less than 7 lactate more than 6.. Patient was started on IV fluid boluses for DKA and possible sepsis along with IV insulin infusion for DKA. Chest x-ray shows infiltrates concerning for pneumonia. She was felt to have Hypovolemic shock and improved with fluid resuscitation & DKA Tx in ICU  transferred to [facility] today 7/1  Assessment & Plan: 1. Shock -due to hypovolemia, fluid loss from DKA -improved with fluid resuscitation and brief use of pressors in ICU -clinically do not suspect Sepsis -stop Vanc, Repeat CXR and if unremarkable stop cefepime -Blood Cx negative  2. DKA -treated with insulin gtt, IVF -very brittle DM, CBgs in 20s this am, will cut down levemir to 5units -add meal coverage once CBGs improved  3. H/o mechanical aortic valve -was not getting warfarin for some reason and remains on Hep gtt -restart Warfarin, bridge with Heparin till INR >2.5  4. Anxiety/Depression -continue celexa  5. CAD status post CABG - denies any chest pain  Consultants:  PCCM  Antimicrobials: Vanc/Cefepime   Subjective: Feels ok, no complaints  Objective: Filed Vitals:   05/18/16 1026 05/18/16 2148 05/19/16 0602 05/19/16 0800  BP: 118/56 143/64 125/54 171/68  Pulse: 71 76 71 61  Temp: 98.1 F (36.7 C) 98.5 F (36.9 C) 98.1 F (36.7 C) 98.2 F (36.8 C)  TempSrc: Oral Oral Oral Oral  Resp: 18 18 18 20   Height: 5\' 3"  (1.6 m)     Weight: 66.951 kg (147 lb 9.6 oz)   64.728 kg (142 lb 11.2 oz)   SpO2: 100% 100% 98%     Intake/Output Summary (Last 24 hours) at 05/19/16 0938 Last data filed at 05/19/16 0900  Gross per 24 hour  Intake 3928.5 ml  Output    700 ml  Net 3228.5 ml   Filed Weights   05/17/16 1100 05/18/16 1026 05/19/16 0602  Weight: 66.4 kg (146 lb 6.2 oz) 66.951 kg (147 lb 9.6 oz) 64.728 kg (142 lb 11.2 oz)    Examination:  General exam: Appears calm and comfortable  Respiratory system: Clear to auscultation. Respiratory effort normal. Cardiovascular system: S1 & S2 heard, RRR. No JVD, murmurs, rubs, gallops or clicks. No pedal edema. Gastrointestinal system: Abdomen is nondistended, soft and nontender. No organomegaly or masses felt. Normal bowel sounds heard. Central nervous system: Alert and oriented. No focal neurological deficits. Extremities: Symmetric 5 x 5 power. Skin: No rashes, lesions or ulcers Psychiatry: Judgement and insight appear normal. Mood & affect appropriate.     Data Reviewed: I have personally reviewed following labs and imaging studies  CBC:  Recent Labs Lab 05/13/16 0336 05/14/16 0645 05/17/16 0340 05/17/16 0343 05/18/16 0539  WBC 6.2 4.1 18.0*  --  8.4  NEUTROABS  --   --  14.9*  --   --   HGB 8.8* 8.7* 9.3* 10.9* 8.5*  HCT 26.2* 26.3* 30.4* 32.0* 26.8*  MCV 85.3 86.2 93.5  --  88.7  PLT 205 182 369  --  311   Basic Metabolic Panel:  Recent Labs Lab 05/13/16 0336 05/14/16 0645 05/17/16 0340 05/17/16 0343 05/17/16 0627 05/17/16 1933 05/17/16 1935 05/18/16 0539 05/18/16 0728  NA 140 138 127* 124* 130* 131*  --   --  136  K 3.7 4.0 5.6* 5.5* 4.6 4.2  --   --  3.5  CL 114* 109 93* 97* 102 109  --   --  109  CO2 23 23 <7*  --  <7* 13*  --   --  19*  GLUCOSE 61* 142* 764* >700* 530* 306*  --   --  69  BUN 6 <5* 14 13 12  5*  --   --  <5*  CREATININE 0.52 0.53 1.65* 0.70 1.52* 0.84  --   --  0.75  CALCIUM 7.6* 8.0* 8.9  --  8.1* 7.6*  --   --  7.9*  MG 2.1  --   --   --   --  1.5*  --   2.3  --   PHOS  --   --   --   --   --   --  1.9* 2.7  --    GFR: Estimated Creatinine Clearance: 71.9 mL/min (by C-G formula based on Cr of 0.75). Liver Function Tests:  Recent Labs Lab 05/13/16 0336 05/17/16 0340  AST 34 21  ALT 17 16  ALKPHOS 75 95  BILITOT 0.7 1.6*  PROT 4.5* 5.6*  ALBUMIN 2.1* 2.7*   No results for input(s): LIPASE, AMYLASE in the last 168 hours. No results for input(s): AMMONIA in the last 168 hours. Coagulation Profile:  Recent Labs Lab 05/13/16 0336 05/14/16 0645 05/17/16 0627 05/18/16 0539 05/19/16 0651  INR 1.24 1.63* 1.17 1.20 1.16   Cardiac Enzymes:  Recent Labs Lab 05/17/16 0627  TROPONINI <0.03   BNP (last 3 results) No results for input(s): PROBNP in the last 8760 hours. HbA1C: No results for input(s): HGBA1C in the last 72 hours. CBG:  Recent Labs Lab 05/19/16 0538 05/19/16 0540 05/19/16 0620 05/19/16 0653 05/19/16 0812  GLUCAP 29* 26* 44* 74 130*   Lipid Profile: No results for input(s): CHOL, HDL, LDLCALC, TRIG, CHOLHDL, LDLDIRECT in the last 72 hours. Thyroid Function Tests: No results for input(s): TSH, T4TOTAL, FREET4, T3FREE, THYROIDAB in the last 72 hours. Anemia Panel: No results for input(s): VITAMINB12, FOLATE, FERRITIN, TIBC, IRON, RETICCTPCT in the last 72 hours. Urine analysis:    Component Value Date/Time   COLORURINE YELLOW 05/17/2016 0353   APPEARANCEUR CLEAR 05/17/2016 0353   LABSPEC 1.025 05/17/2016 0353   PHURINE 5.0 05/17/2016 0353   GLUCOSEU >1000* 05/17/2016 0353   HGBUR NEGATIVE 05/17/2016 0353   BILIRUBINUR NEGATIVE 05/17/2016 0353   BILIRUBINUR n 06/16/2015 1244   KETONESUR >80* 05/17/2016 0353   PROTEINUR NEGATIVE 05/17/2016 0353   PROTEINUR n 06/16/2015 1244   UROBILINOGEN 0.2 07/30/2015 1415   UROBILINOGEN 0.2 06/16/2015 1244   NITRITE NEGATIVE 05/17/2016 0353   NITRITE n 06/16/2015 1244   LEUKOCYTESUR NEGATIVE 05/17/2016 0353   Sepsis  Labs: @LABRCNTIP (procalcitonin:4,lacticidven:4)  ) Recent Results (from the past 240 hour(s))  Urine culture     Status: Abnormal   Collection Time: 05/10/16 10:02 AM  Result Value Ref Range Status   Specimen Description URINE, CLEAN CATCH  Final   Special Requests Normal  Final   Culture (A)  Final    1,000 COLONIES/mL INSIGNIFICANT GROWTH Performed at [facility]    Report Status 05/11/2016 FINAL  Final  MRSA PCR  Screening     Status: None   Collection Time: 05/10/16 12:20 PM  Result Value Ref Range Status   MRSA by PCR NEGATIVE NEGATIVE Final    Comment:        The GeneXpert MRSA Assay (FDA approved for NASAL specimens only), is one component of a comprehensive MRSA colonization surveillance program. It is not intended to diagnose MRSA infection nor to guide or monitor treatment for MRSA infections.   Culture, blood (routine x 2)     Status: None   Collection Time: 05/10/16  1:02 PM  Result Value Ref Range Status   Specimen Description BLOOD RIGHT ARM  Final   Special Requests IN PEDIATRIC BOTTLE 3CC  Final   Culture   Final    NO GROWTH 5 DAYS Performed at [facility]    Report Status 05/15/2016 FINAL  Final  Culture, blood (routine x 2)     Status: None   Collection Time: 05/10/16  1:41 PM  Result Value Ref Range Status   Specimen Description BLOOD RIGHT ANTECUBITAL  Final   Special Requests IN PEDIATRIC BOTTLE 1 CC  Final   Culture   Final    NO GROWTH 5 DAYS Performed at [facility]    Report Status 05/15/2016 FINAL  Final  Blood Culture (routine x 2)     Status: None (Preliminary result)   Collection Time: 05/17/16  3:30 AM  Result Value Ref Range Status   Specimen Description BLOOD RIGHT ARM  Final   Special Requests BOTTLES DRAWN AEROBIC AND ANAEROBIC  Final   Culture NO GROWTH 1 DAY  Final   Report Status PENDING  Incomplete  Blood Culture (routine x 2)     Status: None (Preliminary result)   Collection Time: 05/17/16   3:40 AM  Result Value Ref Range Status   Specimen Description BLOOD LEFT WRIST  Final   Special Requests IN PEDIATRIC BOTTLE  Final   Culture NO GROWTH 1 DAY  Final   Report Status PENDING  Incomplete  Urine culture     Status: None   Collection Time: 05/17/16  3:53 AM  Result Value Ref Range Status   Specimen Description URINE, CATHETERIZED  Final   Special Requests NONE  Final   Culture NO GROWTH  Final   Report Status 05/18/2016 FINAL  Final         Radiology Studies: No results found.      Scheduled Meds: . ceFEPime (MAXIPIME) IV  2 g Intravenous Q12H  . citalopram  40 mg Oral Daily  . famotidine  40 mg Oral QHS  . furosemide  20 mg Intravenous Once  . insulin aspart  0-15 Units Subcutaneous TID WC  . insulin aspart  0-5 Units Subcutaneous QHS  . insulin glargine  5 Units Subcutaneous Daily  . pravastatin  20 mg Oral Daily  . vancomycin  750 mg Intravenous Q12H   Continuous Infusions: . heparin 850 Units/hr (05/18/16 1445)     LOS: 2 days    Time spent:    Zannie Cove, MD [facility] Pager [phone]  If 7PM-7AM, please contact night-coverage [email or web address] Password [facility] 05/19/2016, 9:38 AM

## 2016-05-19 NOTE — Progress Notes (Addendum)
ANTICOAGULATION CONSULT NOTE - Follow Up Consult  Pharmacy Consult for heparin + warfarin Indication: mechanical aortic valve  Allergies  Allergen Reactions  . Adhesive [Tape] Other (See Comments)    Can burn skin if left on too long  . Celebrex [Celecoxib] Rash  . Detrol [Tolterodine] Hives    Patient Measurements: Height: 5\' 3"  (160 cm) Weight: 142 lb 11.2 oz (64.728 kg) (bed scale) IBW/kg (Calculated) : 52.4 Heparin Dosing Weight: 66.4 kg  Vital Signs: Temp: 98.2 F (36.8 C) (07/01 0800) Temp Source: Oral (07/01 0800) BP: 171/68 mmHg (07/01 0800) Pulse Rate: 61 (07/01 0800)  Labs:  Recent Labs  05/17/16 0340 05/17/16 0343 05/17/16 0627 05/17/16 1933 05/17/16 1936 05/18/16 0539 05/18/16 0540 05/18/16 0728 05/19/16 0651 05/19/16 0700  HGB 9.3* 10.9*  --   --   --  8.5*  --   --   --   --   HCT 30.4* 32.0*  --   --   --  26.8*  --   --   --   --   PLT 369  --   --   --   --  311  --   --   --   --   LABPROT  --   --  15.0  --   --  15.4*  --   --  15.0  --   INR  --   --  1.17  --   --  1.20  --   --  1.16  --   HEPARINUNFRC  --   --   --   --  0.53  --  0.46  --   --  0.31  CREATININE 1.65* 0.70 1.52* 0.84  --   --   --  0.75  --   --   TROPONINI  --   --  <0.03  --   --   --   --   --   --   --     Estimated Creatinine Clearance: 71.9 mL/min (by C-G formula based on Cr of 0.75).   Medications:  Scheduled:  . citalopram  40 mg Oral Daily  . famotidine  40 mg Oral QHS  . insulin aspart  0-15 Units Subcutaneous TID WC  . insulin aspart  0-5 Units Subcutaneous QHS  . insulin glargine  5 Units Subcutaneous Daily  . pravastatin  20 mg Oral Daily  . vancomycin  750 mg Intravenous Q12H   Infusions:  . heparin 850 Units/hr (05/18/16 1445)    Assessment: 55 yo f presenting with hyperglycemia and hypotension. On warfarin PTA for AVR. Warfarin has been on hold and she was started on heparin.  HLs therapeutic x 3 on 850 units/hr. Pharmacy is also consulted  to restart warfarin. PTA dose is 7.5 mg daily. INR this AM is 1.16 - will restart 1.5x home dose. H&H 8.5/26.8, Plt 311.  Goal of Therapy:  INR 2.5-3.5 Heparin level 0.3-0.7 units/ml Monitor platelets by anticoagulation protocol: Yes   Plan:  Continue heparin infusion at 850 units/hr Warfarin 11 mg PO x 1 tonight F/u INR in AM to determine further dosing Daily HL, CBC, INR Monitor s/sx of bleeding  Cassie L. 03-06-1986, PharmD Clinical Pharmacist Pager: [phone] 05/19/2016 10:57 AM

## 2016-05-19 NOTE — Progress Notes (Signed)
Pt is refusing blood draws

## 2016-05-19 NOTE — Progress Notes (Signed)
PT Cancellation Note  Patient Details Name: Madison [name] MRN: [id number] DOB: 08/13/1961   Cancelled Treatment:    Reason Eval/Treat Not Completed: Fatigue/lethargy limiting ability to participate;Other (comment) (Pt refusal for not being ready yet).  Will try later as time allows.   Ivar Drape 05/19/2016, 9:47 AM    Samul Dada, PT MS Acute Rehab Dept. Number: [facility] [id number] and [facility] [phone]

## 2016-05-20 LAB — CBC
HCT: 30.9 % — ABNORMAL LOW (ref 36.0–46.0)
HEMOGLOBIN: 9.8 g/dL — AB (ref 12.0–15.0)
MCH: 28.8 pg (ref 26.0–34.0)
MCHC: 31.7 g/dL (ref 30.0–36.0)
MCV: 90.9 fL (ref 78.0–100.0)
PLATELETS: 204 10*3/uL (ref 150–400)
RBC: 3.4 MIL/uL — ABNORMAL LOW (ref 3.87–5.11)
RDW: 17 % — AB (ref 11.5–15.5)
WBC: 3.6 10*3/uL — ABNORMAL LOW (ref 4.0–10.5)

## 2016-05-20 LAB — BASIC METABOLIC PANEL
Anion gap: 6 (ref 5–15)
CALCIUM: 8.5 mg/dL — AB (ref 8.9–10.3)
CO2: 24 mmol/L (ref 22–32)
Chloride: 108 mmol/L (ref 101–111)
Creatinine, Ser: 0.6 mg/dL (ref 0.44–1.00)
GFR calc Af Amer: >60 mL/min (ref >60)
GLUCOSE: 217 mg/dL — AB (ref 65–99)
Potassium: 4.1 mmol/L (ref 3.5–5.1)
SODIUM: 138 mmol/L (ref 135–145)

## 2016-05-20 LAB — GLUCOSE, CAPILLARY
GLUCOSE-CAPILLARY: 214 mg/dL — AB (ref 65–99)
GLUCOSE-CAPILLARY: 151 mg/dL — AB (ref 65–99)
Glucose-Capillary: 183 mg/dL — ABNORMAL HIGH (ref 65–99)
Glucose-Capillary: 278 mg/dL — ABNORMAL HIGH (ref 65–99)
Glucose-Capillary: 202 mg/dL — ABNORMAL HIGH (ref 65–99)

## 2016-05-20 LAB — HEPARIN LEVEL (UNFRACTIONATED): HEPARIN UNFRACTIONATED: 0.45 [IU]/mL (ref 0.30–0.70)

## 2016-05-20 LAB — PROTIME-INR
INR: 1.19 (ref 0.00–1.49)
Prothrombin Time: 15.2 seconds (ref 11.6–15.2)

## 2016-05-20 MED ORDER — FUROSEMIDE 20 MG PO TABS
20.0000 mg | ORAL_TABLET | Freq: Every day | ORAL | Status: DC
  Administered 2016-05-20 – 2016-05-24 (×5): 20 mg via ORAL
  Filled 2016-05-20 (×5): qty 1

## 2016-05-20 MED ORDER — WARFARIN SODIUM 5 MG PO TABS
11.0000 mg | ORAL_TABLET | Freq: Once | ORAL | Status: AC
  Administered 2016-05-20: 11 mg via ORAL
  Filled 2016-05-20: qty 2

## 2016-05-20 NOTE — Progress Notes (Signed)
PROGRESS NOTE    Madison [name]  WUJ:[id number] DOB: 02/20/1961 DOA: 05/17/2016 PCP: [name], NP  Brief Narrative:Madison [name] is a 55 y.o. female with diabetes mellitus type 1, mechanical aortic valve, CAD status post CABG who was discharged 2 days ago was found to be hypotensive by patient's home health aide. Patient was referred to the ER. The ER patient's blood pressure was in the 80 systolic with heart rate around 130 beats per minute. Patient's lab work show markedly elevated blood sugar with bicarbonate less than 7 lactate more than 6.. Patient was started on IV fluid boluses for DKA and possible sepsis along with IV insulin infusion for DKA. Chest x-ray shows infiltrates concerning for pneumonia. She was felt to have Hypovolemic shock and improved with fluid resuscitation & DKA Tx in ICU  transferred to [facility] today 7/1  Assessment & Plan: 1. Shock -due to hypovolemia, fluid loss from DKA -improved with fluid resuscitation and brief use of pressors in ICU -clinically do not suspect Sepsis -stopped Vanc and cefepime, Repeat CXR negative -Blood Cx negative  2. DKA -treated with insulin gtt, IVF -very brittle DM, CBGs improved  3. H/o mechanical aortic valve -was not getting warfarin for some reason and remains on Hep gtt -restarted Warfarin, bridge with Heparin till INR >2.5  4. Anxiety/Depression -continue celexa  5. CAD status post CABG - denies any chest pain  6. Cognitive issues -needs more assistance, has home health aide -wants to move to [place] to be with her mother  Full Code Communication: none at bedside, no close family Dispo: home when INR >2.5  Consultants:  PCCM  Antimicrobials: Vanc/Cefepime   Subjective: Feels ok, no complaints  Objective: Filed Vitals:   05/19/16 2036 05/20/16 0522 05/20/16 0700 05/20/16 1213  BP: 118/45 162/60 157/63 133/56  Pulse: 67 68 74 69  Temp: 99 F (37.2 C) 98.1 F (36.7 C)  98.6 F (37 C)  TempSrc: Oral Oral   Oral  Resp: 18 16  18   Height:      Weight:  62.823 kg (138 lb 8 oz)    SpO2: 97% 98%  98%    Intake/Output Summary (Last 24 hours) at 05/20/16 1220 Last data filed at 05/20/16 0903  Gross per 24 hour  Intake    856 ml  Output    500 ml  Net    356 ml   Filed Weights   05/18/16 1026 05/19/16 0602 05/20/16 0522  Weight: 66.951 kg (147 lb 9.6 oz) 64.728 kg (142 lb 11.2 oz) 62.823 kg (138 lb 8 oz)    Examination:  General exam: Appears calm and comfortable  Respiratory system: Clear to auscultation. Respiratory effort normal. Cardiovascular system: S1 & S2 heard, RRR. No JVD, murmurs, rubs, gallops or clicks. No pedal edema. Gastrointestinal system: Abdomen is nondistended, soft and nontender. No organomegaly or masses felt. Normal bowel sounds heard. Central nervous system: Alert and oriented. No focal neurological deficits. Extremities: Symmetric 5 x 5 power. Skin: No rashes, lesions or ulcers Psychiatry: Judgement and insight appear normal. Mood & affect appropriate.     Data Reviewed: I have personally reviewed following labs and imaging studies  CBC:  Recent Labs Lab 05/14/16 0645 05/17/16 0340 05/17/16 0343 05/18/16 0539 05/19/16 1130 05/20/16 0555  WBC 4.1 18.0*  --  8.4 4.7 3.6*  NEUTROABS  --  14.9*  --   --  2.2  --   HGB 8.7* 9.3* 10.9* 8.5* 9.2* 9.8*  HCT 26.3* 30.4* 32.0*  26.8* 28.5* 30.9*  MCV 86.2 93.5  --  88.7 87.7 90.9  PLT 182 369  --  311 257 204   Basic Metabolic Panel:  Recent Labs Lab 05/17/16 0627 05/17/16 1933 05/17/16 1935 05/18/16 0539 05/18/16 0728 05/19/16 1130 05/20/16 0555  NA 130* 131*  --   --  136 137 138  K 4.6 4.2  --   --  3.5 4.7 4.1  CL 102 109  --   --  109 109 108  CO2 <7* 13*  --   --  19* 22 24  GLUCOSE 530* 306*  --   --  69 116* 217*  BUN 12 5*  --   --  <5* <5* <5*  CREATININE 1.52* 0.84  --   --  0.75 0.57 0.60  CALCIUM 8.1* 7.6*  --   --  7.9* 8.3* 8.5*  MG  --  1.5*  --  2.3  --  1.8  --   PHOS  --    --  1.9* 2.7  --  2.3*  --    GFR: Estimated Creatinine Clearance: 65.7 mL/min (by C-G formula based on Cr of 0.6). Liver Function Tests:  Recent Labs Lab 05/17/16 0340 05/19/16 1130  AST 21  --   ALT 16  --   ALKPHOS 95  --   BILITOT 1.6*  --   PROT 5.6*  --   ALBUMIN 2.7* 2.0*   No results for input(s): LIPASE, AMYLASE in the last 168 hours. No results for input(s): AMMONIA in the last 168 hours. Coagulation Profile:  Recent Labs Lab 05/14/16 0645 05/17/16 0627 05/18/16 0539 05/19/16 0651 05/20/16 0555  INR 1.63* 1.17 1.20 1.16 1.19   Cardiac Enzymes:  Recent Labs Lab 05/17/16 0627  TROPONINI <0.03   BNP (last 3 results) No results for input(s): PROBNP in the last 8760 hours. HbA1C: No results for input(s): HGBA1C in the last 72 hours. CBG:  Recent Labs Lab 05/19/16 1636 05/19/16 2117 05/20/16 0503 05/20/16 0613 05/20/16 1123  GLUCAP 211* 129* 183* 214* 151*   Lipid Profile: No results for input(s): CHOL, HDL, LDLCALC, TRIG, CHOLHDL, LDLDIRECT in the last 72 hours. Thyroid Function Tests: No results for input(s): TSH, T4TOTAL, FREET4, T3FREE, THYROIDAB in the last 72 hours. Anemia Panel: No results for input(s): VITAMINB12, FOLATE, FERRITIN, TIBC, IRON, RETICCTPCT in the last 72 hours. Urine analysis:    Component Value Date/Time   COLORURINE YELLOW 05/17/2016 0353   APPEARANCEUR CLEAR 05/17/2016 0353   LABSPEC 1.025 05/17/2016 0353   PHURINE 5.0 05/17/2016 0353   GLUCOSEU >1000* 05/17/2016 0353   HGBUR NEGATIVE 05/17/2016 0353   BILIRUBINUR NEGATIVE 05/17/2016 0353   BILIRUBINUR n 06/16/2015 1244   KETONESUR >80* 05/17/2016 0353   PROTEINUR NEGATIVE 05/17/2016 0353   PROTEINUR n 06/16/2015 1244   UROBILINOGEN 0.2 07/30/2015 1415   UROBILINOGEN 0.2 06/16/2015 1244   NITRITE NEGATIVE 05/17/2016 0353   NITRITE n 06/16/2015 1244   LEUKOCYTESUR NEGATIVE 05/17/2016 0353   Sepsis Labs: @LABRCNTIP (procalcitonin:4,lacticidven:4)  ) Recent  Results (from the past 240 hour(s))  Culture, blood (routine x 2)     Status: None   Collection Time: 05/10/16  1:02 PM  Result Value Ref Range Status   Specimen Description BLOOD RIGHT ARM  Final   Special Requests IN PEDIATRIC BOTTLE 3CC  Final   Culture   Final    NO GROWTH 5 DAYS Performed at [facility]    Report Status 05/15/2016 FINAL  Final  Culture,  blood (routine x 2)     Status: None   Collection Time: 05/10/16  1:41 PM  Result Value Ref Range Status   Specimen Description BLOOD RIGHT ANTECUBITAL  Final   Special Requests IN PEDIATRIC BOTTLE 1 CC  Final   Culture   Final    NO GROWTH 5 DAYS Performed at [facility]    Report Status 05/15/2016 FINAL  Final  Blood Culture (routine x 2)     Status: None (Preliminary result)   Collection Time: 05/17/16  3:30 AM  Result Value Ref Range Status   Specimen Description BLOOD RIGHT ARM  Final   Special Requests BOTTLES DRAWN AEROBIC AND ANAEROBIC  Final   Culture NO GROWTH 2 DAYS  Final   Report Status PENDING  Incomplete  Blood Culture (routine x 2)     Status: None (Preliminary result)   Collection Time: 05/17/16  3:40 AM  Result Value Ref Range Status   Specimen Description BLOOD LEFT WRIST  Final   Special Requests IN PEDIATRIC BOTTLE  Final   Culture NO GROWTH 2 DAYS  Final   Report Status PENDING  Incomplete  Urine culture     Status: None   Collection Time: 05/17/16  3:53 AM  Result Value Ref Range Status   Specimen Description URINE, CATHETERIZED  Final   Special Requests NONE  Final   Culture NO GROWTH  Final   Report Status 05/18/2016 FINAL  Final         Radiology Studies: Dg Chest 2 View  05/19/2016  CLINICAL DATA:  Followup right upper lobe infiltrate. EXAM: CHEST  2 VIEW COMPARISON:  05/17/2016 FINDINGS: Cardiac shadow is within normal limits. Postsurgical changes are again seen. The lungs are well aerated without focal infiltrate. Mild interstitial edema is seen. Small bilateral  pleural with. IMPRESSION: Mild interstitial edema and small pleural effusions. Electronically Signed   By: Alcide Clever M.D.   On: 05/19/2016 10:19        Scheduled Meds: . citalopram  40 mg Oral Daily  . famotidine  40 mg Oral QHS  . furosemide  20 mg Oral Daily  . insulin aspart  0-15 Units Subcutaneous TID WC  . insulin aspart  0-5 Units Subcutaneous QHS  . insulin glargine  5 Units Subcutaneous Daily  . pantoprazole  40 mg Oral Q1200  . pravastatin  20 mg Oral Daily  . warfarin  11 mg Oral ONCE-1800  . Warfarin - Pharmacist Dosing Inpatient   Does not apply q1800   Continuous Infusions: . heparin 850 Units/hr (05/19/16 2005)     LOS: 3 days    Time spent:    Zannie Cove, MD [facility] Pager [phone]  If 7PM-7AM, please contact night-coverage [email or web address] Password [email or web address] 05/20/2016, 12:20 PM

## 2016-05-20 NOTE — Evaluation (Signed)
Physical Therapy Evaluation Patient Details Name: Madison [name] MRN: [id number] DOB: 1961-10-22 Today's Date: 05/20/2016   History of Present Illness  This is a 55 yo female with frequent admissions secondary to DKA with PMH of Type I DM, Mechanic aortic valve replacement (takes coumadin), CHF, CAD, Obesity, Hypercholesteremia, HTN, Dyslipidemia, Stroke syndrome, GERD, Anxiety, Depression, Rheumatoid arthritis, Aortic Stenosis, Thrombophlebitis, Heart Murmur, MI, and Pneumonia. She presented to [facility] ER on 05/17/2016 with c/o hyperglycemia, onset 2 days ago blood sugars ranging between 400 to 500's. dizzy and fall at home day before admission.   Clinical Impression   Pt admitted with above diagnosis. Pt currently with functional limitations due to the deficits listed below (see PT Problem List).  Pt will benefit from skilled PT to increase their independence and safety with mobility to allow discharge to the venue listed below.       Follow Up Recommendations Home health PT;Other (comment) [facility] and continued HHAide; Worth considering SNF for post-acute rehab, however pt adamantly refusing)    Equipment Recommendations  None recommended by PT    Recommendations for Other Services OT consult     Precautions / Restrictions Precautions Precautions: Fall      Mobility  Bed Mobility Overal bed mobility: Needs Assistance Bed Mobility: Supine to Sit     Supine to sit: Supervision     General bed mobility comments: cues for techique  Transfers Overall transfer level: Needs assistance Equipment used: 1 person hand held assist Transfers: Sit to/from Stand Sit to Stand: Min assist         General transfer comment: Min assist to steady  Ambulation/Gait Ambulation/Gait assistance: Min assist Ambulation Distance (Feet): 15 Feet Assistive device: 1 person hand held assist Gait Pattern/deviations: Step-through pattern;Wide base of support Gait velocity: slower   General  Gait Details: Min assist to steady; pt declined using RW or cane; pt declined hallway amb  Stairs            Wheelchair Mobility    Modified Rankin (Stroke Patients Only)       Balance     Sitting balance-Leahy Scale: Good       Standing balance-Leahy Scale: Fair                               Pertinent Vitals/Pain Pain Assessment: No/denies pain    Home Living Family/patient expects to be discharged to:: Private residence Living Arrangements: Alone Available Help at Discharge: Personal care attendant;Friend(s) (PCA daily for 3 hours) Type of Home: Apartment Home Access: Stairs to enter Entrance Stairs-Rails: Right Entrance Stairs-Number of Steps: 3 Home Layout: One level Home Equipment: Grab bars - tub/shower;Walker - 2 wheels;Cane - single point;Grab bars - toilet;Bedside commode Additional Comments: Pt has roomate who works during the day but has an Aide M - Sat for 3 hrs a day; pt not forthcoming with this information -- gleaned it from chart review    Prior Function Level of Independence: Independent         Comments: Reports she has an aide assist with IADLs, driving to appts etc.  reports that she prepares all of her meals     Hand Dominance   Dominant Hand: Right    Extremity/Trunk Assessment   Upper Extremity Assessment: LUE deficits/detail       LUE Deficits / Details: weakness and moves synergistically   Lower Extremity Assessment: LLE deficits/detail   LLE Deficits / Details: mild  weakness, moves in isolation     Communication   Communication: Expressive difficulties (at times difficult to understand)  Cognition Arousal/Alertness: Awake/alert Behavior During Therapy: WFL for tasks assessed/performed Overall Cognitive Status: Within Functional Limits for tasks assessed (for simple mobility tasks)                      General Comments      Exercises        Assessment/Plan    PT Assessment Patient needs  continued PT services  PT Diagnosis Abnormality of gait;Generalized weakness   PT Problem List Decreased strength;Decreased activity tolerance;Decreased balance;Decreased mobility  PT Treatment Interventions Gait training;Stair training;Functional mobility training;Therapeutic activities;Balance training;Patient/family education   PT Goals (Current goals can be found in the Care Plan section) Acute Rehab PT Goals Patient Stated Goal: back home once ready PT Goal Formulation: With patient Time For Goal Achievement: 06/03/16 Potential to Achieve Goals: Good    Frequency Min 3X/week   Barriers to discharge   Not sure how much of the day Ms. Yeomans is by herself    Co-evaluation               End of Session Equipment Utilized During Treatment: Gait belt Activity Tolerance: Patient tolerated treatment well Patient left: in chair;with call bell/phone within reach;with chair alarm set Nurse Communication: Mobility status         Time: 1152-1209 PT Time Calculation (min) (ACUTE ONLY): 17 min   Charges:   PT Evaluation $PT Eval Moderate Complexity: 1 Procedure     PT G CodesOlen Pel 05/20/2016, 1:34 PM  [name], [place]  Acute Rehabilitation Services Pager [phone] Office [phone]

## 2016-05-20 NOTE — Progress Notes (Signed)
ANTICOAGULATION CONSULT NOTE - Follow Up Consult  Pharmacy Consult for heparin + warfarin Indication: mechanical aortic valve  Allergies  Allergen Reactions  . Adhesive [Tape] Other (See Comments)    Can burn skin if left on too long  . Celebrex [Celecoxib] Rash  . Detrol [Tolterodine] Hives    Patient Measurements: Height: 5\' 3"  (160 cm) Weight: 138 lb 8 oz (62.823 kg) (bed scale) IBW/kg (Calculated) : 52.4 Heparin Dosing Weight: 66.4 kg  Vital Signs: Temp: 98.1 F (36.7 C) (07/02 0522) Temp Source: Oral (07/02 0522) BP: 157/63 mmHg (07/02 0700) Pulse Rate: 74 (07/02 0700)  Labs:  Recent Labs  05/18/16 0539 05/18/16 0540 05/18/16 0728 05/19/16 0651 05/19/16 0700 05/19/16 1130 05/20/16 0555  HGB 8.5*  --   --   --   --  9.2* 9.8*  HCT 26.8*  --   --   --   --  28.5* 30.9*  PLT 311  --   --   --   --  257 204  LABPROT 15.4*  --   --  15.0  --   --  15.2  INR 1.20  --   --  1.16  --   --  1.19  HEPARINUNFRC  --  0.46  --   --  0.31  --  0.45  CREATININE  --   --  0.75  --   --  0.57 0.60    Estimated Creatinine Clearance: 65.7 mL/min (by C-G formula based on Cr of 0.6).   Medications:  Scheduled:  . citalopram  40 mg Oral Daily  . famotidine  40 mg Oral QHS  . furosemide  20 mg Oral Daily  . insulin aspart  0-15 Units Subcutaneous TID WC  . insulin aspart  0-5 Units Subcutaneous QHS  . insulin glargine  5 Units Subcutaneous Daily  . pantoprazole  40 mg Oral Q1200  . pravastatin  20 mg Oral Daily  . Warfarin - Pharmacist Dosing Inpatient   Does not apply q1800   Infusions:  . heparin 850 Units/hr (05/19/16 2005)    Assessment: 55 yo f on warfarin PTA for AVR. Pharmacy is consulted to dose warfarin and heparin while INR subtherapeutic.   HLs therapeutic x 4 on 850 units/hr. PTA warfarin dose is 7.5 mg daily. INR this AM is 1.19 after 11 mg dose last night  - will give another high dose tonight. Hgb stable 9.8, plts 204. No issues per RN.  Goal of  Therapy:  INR 2.5-3.5 Heparin level 0.3-0.7 units/ml Monitor platelets by anticoagulation protocol: Yes   Plan:  Continue heparin infusion at 850 units/hr Warfarin 11 mg PO x 1 again tonight F/u INR in AM to determine further dosing Daily HL, CBC, INR Monitor s/sx of bleeding  Cassie L. 55, PharmD Clinical Pharmacist Pager: [phone] 05/20/2016 9:41 AM

## 2016-05-21 ENCOUNTER — Other Ambulatory Visit: Payer: Self-pay

## 2016-05-21 LAB — BASIC METABOLIC PANEL
Anion gap: 5 (ref 5–15)
BUN: <5 mg/dL — ABNORMAL LOW (ref 6–20)
CALCIUM: 8.7 mg/dL — AB (ref 8.9–10.3)
CHLORIDE: 108 mmol/L (ref 101–111)
CO2: 25 mmol/L (ref 22–32)
CREATININE: 0.62 mg/dL (ref 0.44–1.00)
GFR calc Af Amer: >60 mL/min (ref >60)
GFR calc non Af Amer: >60 mL/min (ref >60)
GLUCOSE: 146 mg/dL — AB (ref 65–99)
Potassium: 3.9 mmol/L (ref 3.5–5.1)
Sodium: 138 mmol/L (ref 135–145)

## 2016-05-21 LAB — GLUCOSE, CAPILLARY
GLUCOSE-CAPILLARY: 95 mg/dL (ref 65–99)
Glucose-Capillary: 171 mg/dL — ABNORMAL HIGH (ref 65–99)
Glucose-Capillary: 119 mg/dL — ABNORMAL HIGH (ref 65–99)
Glucose-Capillary: 360 mg/dL — ABNORMAL HIGH (ref 65–99)
Glucose-Capillary: 311 mg/dL — ABNORMAL HIGH (ref 65–99)

## 2016-05-21 LAB — HEPARIN LEVEL (UNFRACTIONATED)
HEPARIN UNFRACTIONATED: 0.56 [IU]/mL (ref 0.30–0.70)
Heparin Unfractionated: 0.2 IU/mL — ABNORMAL LOW (ref 0.30–0.70)
Heparin Unfractionated: 0.58 IU/mL (ref 0.30–0.70)

## 2016-05-21 LAB — PROTIME-INR
INR: 1.65 — AB (ref 0.00–1.49)
PROTHROMBIN TIME: 19.5 s — AB (ref 11.6–15.2)

## 2016-05-21 MED ORDER — WARFARIN SODIUM 10 MG PO TABS
10.0000 mg | ORAL_TABLET | Freq: Once | ORAL | Status: AC
  Administered 2016-05-21: 10 mg via ORAL
  Filled 2016-05-21: qty 1

## 2016-05-21 MED ORDER — INSULIN ASPART 100 UNIT/ML ~~LOC~~ SOLN
2.0000 [IU] | Freq: Three times a day (TID) | SUBCUTANEOUS | Status: DC
  Administered 2016-05-22 – 2016-05-24 (×8): 2 [IU] via SUBCUTANEOUS

## 2016-05-21 NOTE — Progress Notes (Signed)
ANTICOAGULATION CONSULT NOTE - Follow Up Consult  Pharmacy Consult for heparin Indication: AVR  Labs:  Recent Labs  05/18/16 0539  05/18/16 0728 05/19/16 0651 05/19/16 0700 05/19/16 1130 05/20/16 0555 05/21/16 0231  HGB 8.5*  --   --   --   --  9.2* 9.8*  --   HCT 26.8*  --   --   --   --  28.5* 30.9*  --   PLT 311  --   --   --   --  257 204  --   LABPROT 15.4*  --   --  15.0  --   --  15.2 19.5*  INR 1.20  --   --  1.16  --   --  1.19 1.65*  HEPARINUNFRC  --   < >  --   --  0.31  --  0.45 0.20*  CREATININE  --   --  0.75  --   --  0.57 0.60  --   < > = values in this interval not displayed.   Assessment: 55yo female now subtherapeutic on heparin after being fairly stable at current rate; no gtt issues overnight per RN.  Goal of Therapy:  Heparin level 0.3-0.7 units/ml   Plan:  Will increase heparin gtt by 1-2 units/kg/hr to 950 units/hr and check level in 6hr.  Vernard Gambles, PharmD, BCPS  05/21/2016,3:13 AM

## 2016-05-21 NOTE — Consult Note (Signed)
   [facility] CM Inpatient Consult   05/21/2016  Madison [name] 09/27/1961 [id number]    Went to bedside to speak with Madison [name]. Upon arrival to room, Madison [name] indicated she is agreeable to going to SNF at discharge. Discussed how this is a safer discharge plan for her. Applauded Madison [name] for agreeing to SNF. She asked that [facility] Care Management contact [facility] to make them aware that she will be at [facility] for their assessment. She is supposed to have an assessment for [facility] on 05/23/16. Will pass this information along to [facility] once patient has discharged officially to SNF and facility is known. Spoke with inpatient RNCM who confirms discharge plan is for SNF. Also made inpatient RNCM aware that going to [place] is not an option for Madison [name] as her mother has to have heart surgery herself. Will update [facility] RNCM as well.   Raiford Noble, MSN-Ed, RN,BSN [facility] Liaison [phone]

## 2016-05-21 NOTE — Clinical Social Work Placement (Signed)
   CLINICAL SOCIAL WORK PLACEMENT  NOTE  Date:  05/21/2016  Patient Details  Name: TERAH ROBEY MRN: [id number] Date of Birth: 1961/03/15  Clinical Social Work is seeking post-discharge placement for this patient at the Skilled  Nursing Facility level of care (*CSW will initial, date and re-position this form in  chart as items are completed):  Yes   Patient/family provided with [facility] Clinical Social Work Department's list of facilities offering this level of care within the geographic area requested by the patient (or if unable, by the patient's family).  Yes   Patient/family informed of their freedom to choose among providers that offer the needed level of care, that participate in Medicare, Medicaid or managed care program needed by the patient, have an available bed and are willing to accept the patient.  Yes   Patient/family informed of [facility]'s ownership interest in [facility] and [facility], as well as of the fact that they are under no obligation to receive care at these facilities.  PASRR submitted to EDS on 05/21/16     PASRR number received on       Existing PASRR number confirmed on 05/21/16     FL2 transmitted to all facilities in geographic area requested by pt/family on       FL2 transmitted to all facilities within larger geographic area on       Patient informed that his/her managed care company has contracts with or will negotiate with certain facilities, including the following:            Patient/family informed of bed offers received.  Patient chooses bed at       Physician recommends and patient chooses bed at      Patient to be transferred to   on  .  Patient to be transferred to facility by       Patient family notified on   of transfer.  Name of family member notified:        PHYSICIAN Please sign FL2     Additional Comment:    _______________________________________________ Margarito Liner, LCSW 05/21/2016, 11:24 AM

## 2016-05-21 NOTE — Progress Notes (Signed)
ANTICOAGULATION CONSULT NOTE - Follow Up Consult  Pharmacy Consult for heparin + warfarin Indication: mechanical aortic valve  Allergies  Allergen Reactions  . Adhesive [Tape] Other (See Comments)    Can burn skin if left on too long  . Celebrex [Celecoxib] Rash  . Detrol [Tolterodine] Hives    Patient Measurements: Height: 5\' 3"  (160 cm) Weight: 129 lb 9.6 oz (58.786 kg) (b scale; pt can stand on standing scale now) IBW/kg (Calculated) : 52.4 Heparin Dosing Weight: 66.4 kg  Vital Signs: Temp: 99 F (37.2 C) (07/03 1152) Temp Source: Oral (07/03 1152) BP: 149/61 mmHg (07/03 1152) Pulse Rate: 64 (07/03 1152)  Labs:  Recent Labs  05/19/16 0651  05/19/16 1130 05/20/16 0555 05/21/16 0231 05/21/16 1011  HGB  --   --  9.2* 9.8*  --   --   HCT  --   --  28.5* 30.9*  --   --   PLT  --   --  257 204  --   --   LABPROT 15.0  --   --  15.2 19.5*  --   INR 1.16  --   --  1.19 1.65*  --   HEPARINUNFRC  --   < >  --  0.45 0.20* 0.58  CREATININE  --   --  0.57 0.60 0.62  --   < > = values in this interval not displayed.  Estimated Creatinine Clearance: 65.7 mL/min (by C-G formula based on Cr of 0.62).   Medications:  Scheduled:  . citalopram  40 mg Oral Daily  . famotidine  40 mg Oral QHS  . furosemide  20 mg Oral Daily  . insulin aspart  0-15 Units Subcutaneous TID WC  . insulin aspart  0-5 Units Subcutaneous QHS  . insulin aspart  2 Units Subcutaneous TID WC  . insulin glargine  5 Units Subcutaneous Daily  . pantoprazole  40 mg Oral Q1200  . pravastatin  20 mg Oral Daily  . warfarin  10 mg Oral ONCE-1800  . Warfarin - Pharmacist Dosing Inpatient   Does not apply q1800   Infusions:  . heparin 950 Units/hr (05/21/16 0330)    Assessment: 55 yo f on warfarin PTA for AVR. Pharmacy is consulted to dose warfarin and heparin while INR subtherapeutic.   HL now therapeutic at 0.58 after most recent rate increase to 950 units/hr.  INR increased to 1.65 < 1.19 after 2 doses  of warfarin 11 mg received. Hgb stable.  Goal of Therapy:  INR 2.5-3.5 Heparin level 0.3-0.7 units/ml Monitor platelets by anticoagulation protocol: Yes   Plan:  1. Continue heparin infusion at 950 units/hr; will get confirmatory HL later today 2. Warfarin 10mg  PO x 1 tonight 3. Daily HL, CBC, INR 4. Monitor s/sx of bleeding  53, PharmD, BCPS 05/21/2016, 12:53 PM Pager: [phone]

## 2016-05-21 NOTE — Clinical Social Work Note (Signed)
Clinical Social Work Assessment  Patient Details  Name: Madison [name] MRN: [id number] Date of Birth: 25-Jul-1961  Date of referral:  05/21/16               Reason for consult:  Facility Placement, Discharge Planning                Permission sought to share information with:  Facility Sport and exercise psychologist, Family Supports Permission granted to share information::  Yes, Verbal Permission Granted  Name::     [name]  Agency::  SNF's  Relationship::  Sister  Contact Information:  [phone]  Housing/Transportation Living arrangements for the past 2 months:  Apartment Source of Information:  Patient, Medical Team Patient Interpreter Needed:  None Criminal Activity/Legal Involvement Pertinent to Current Situation/Hospitalization:  No - Comment as needed Significant Relationships:  Siblings Lives with:  Self Do you feel safe going back to the place where you live?  Yes Need for family participation in patient care:  Yes (Comment)  Care giving concerns:  PT recommending SNF once medically stable for discharge. Patient initially refused but has since changed her mind.   Social Worker assessment / plan:  CSW met with patient. No supports at bedside. CSW introduced role and explained that discharge planning would be discussed. CSW confirmed that patient is now interested in SNF. Patient wants to go to [name] because she has been there before. CSW inquired about backup SNF's and patient did not know of any. CSW provided SNF list. CSW will fax information out to other SNF's in the area if [name] declines. No further concerns. CSW encouraged patient to contact CSW as needed. CSW will continue to follow patient and facilitate discharge to SNF once medically stable.  Employment status:  Unemployed Nurse, adult PT Recommendations:  [place] / Referral to community resources:  [employer]  Patient/Family's  Response to care:  Patient agreeable to SNF placement. Patient's sister supportive and involved in patient's care. Patient appreciated social work intervention.  Patient/Family's Understanding of and Emotional Response to Diagnosis, Current Treatment, and Prognosis:  Patient now understands the need for rehab before returning home. She initially refused but has since changed her mind.  Emotional Assessment Appearance:  Appears stated age Attitude/Demeanor/Rapport:   (Pleasant) Affect (typically observed):  Accepting, Appropriate, Calm, Pleasant Orientation:  Oriented to Self, Oriented to Place, Oriented to  Time, Oriented to Situation Alcohol / Substance use:  Never Used Psych involvement (Current and /or in the community):  No (Comment)  Discharge Needs  Concerns to be addressed:  Care Coordination Readmission within the last 30 days:  Yes Current discharge risk:  Dependent with Mobility, Lives alone Barriers to Discharge:  [place], LCSW 05/21/2016, 11:20 AM

## 2016-05-21 NOTE — Progress Notes (Signed)
PT Cancellation Note  Patient Details Name: MICHEL ESKELSON MRN: [id number] DOB: 12-26-1960   Cancelled Treatment:     Pt declined no reason given.  Will attempt again as schedule permits.   Armando Reichert 05/21/2016, 3:52 PM

## 2016-05-21 NOTE — Evaluation (Signed)
Occupational Therapy Evaluation Patient Details Name: Madison [name] MRN: [id number] DOB: 05-23-1961 Today's Date: 05/21/2016    History of Present Illness This is a 55 yo female with frequent admissions secondary to DKA with PMH of Type I DM, Mechanic aortic valve replacement (takes coumadin), CHF, CAD, Obesity, Hypercholesteremia, HTN, Dyslipidemia, Stroke syndrome, GERD, Anxiety, Depression, Rheumatoid arthritis, Aortic Stenosis, Thrombophlebitis, Heart Murmur, MI, and Pneumonia. She presented to [facility] ER on 05/17/2016 with c/o hyperglycemia, onset 2 days ago blood sugars ranging between 400 to 500's. dizzy and fall at home day before admission.    Clinical Impression   Patient presenting with decreased ADL and functional mobility independence secondary to above. Patient mod I to supervision for some tasks PTA, pt getting assistance from PCA 3 hours per day M-Sat. Patient currently functioning at an overall supervision to min guard/assist level. Patient will benefit from acute OT to increase overall independence in the areas of ADLs, functional mobility, and overall safety in order to safely discharge to venue listed below.     Follow Up Recommendations  SNF;Supervision/Assistance - 24 hour    Equipment Recommendations  None recommended by OT    Recommendations for Other Services  None at this time    Precautions / Restrictions Precautions Precautions: Fall Restrictions Weight Bearing Restrictions: No     Mobility Bed Mobility General bed mobility comments: Pt found seated in recliner upon OT entering room   Transfers Overall transfer level: Needs assistance Equipment used: 1 person hand held assist Transfers: Sit to/from Stand Sit to Stand: Min guard         General transfer comment: Min guard for safety, no cues needed    Balance Overall balance assessment: Needs assistance;History of Falls (Pt reports 1 fall within 6 months, recent fall was due to "tripping over  something") Sitting-balance support: No upper extremity supported;Feet supported Sitting balance-Leahy Scale: Good     Standing balance support: No upper extremity supported;During functional activity Standing balance-Leahy Scale: Fair    ADL Overall ADL's : Needs assistance/impaired General ADL Comments: Pt overall supervision to min guard assist for ADLs and functional mobility. Pt able to cross BLEs for LB ADLs, but takes increased time to complete LB ADLs. Pt with increased tone in LUE and mainly uses RUE for tasks.     Vision Additional Comments: Pt with difficulty locating some items, but per her reports she just needs her glassess that were left at home           Pertinent Vitals/Pain Pain Assessment: No/denies pain     Hand Dominance Right   Extremity/Trunk Assessment Upper Extremity Assessment Upper Extremity Assessment: LUE deficits/detail LUE Deficits / Details: increased tone and moves syndergistically - from previous stroke/s LUE Coordination: decreased fine motor   Lower Extremity Assessment Lower Extremity Assessment: Defer to PT evaluation   Cervical / Trunk Assessment Cervical / Trunk Assessment: Kyphotic   Communication Communication Communication: Expressive difficulties (from previous stroke/s)   Cognition Arousal/Alertness: Awake/alert Behavior During Therapy: WFL for tasks assessed/performed Overall Cognitive Status: Within Functional Limits for tasks assessed (for basic tasks, pt with good insight into her deficits - she spoke with this OT about want to to go to SNF)              Home Living Family/patient expects to be discharged to:: Private residence Living Arrangements: Alone Available Help at Discharge: Personal care attendant;Friend(s) (daily for 3hours per day) Type of Home: Apartment Home Access: Stairs to enter [employer] of  Steps: 3 Entrance Stairs-Rails: Right Home Layout: One level     Bathroom Shower/Tub: Retail banker: Handicapped height     Home Equipment: Grab bars - tub/shower;Walker - 2 wheels;Cane - single point;Grab bars - toilet;Bedside commode;Shower seat   Additional Comments: Pt has roomate who works during the day but has an Engineer, production M - Sat for 3 hrs a day      Prior Functioning/Environment Level of Independence: Independent        Comments: Reports she has an aide assist with IADLs, driving to appts etc.  reports that she prepares all of her meals    OT Diagnosis: Generalized weakness   OT Problem List: Decreased strength;Decreased activity tolerance;Impaired balance (sitting and/or standing);Decreased safety awareness   OT Treatment/Interventions: Self-care/ADL training;Therapeutic exercise;DME and/or AE instruction;Energy conservation;Therapeutic activities;Patient/family education;Balance training    OT Goals(Current goals can be found in the care plan section) Acute Rehab OT Goals Patient Stated Goal: go to SNF OT Goal Formulation: With patient Time For Goal Achievement: 05/28/16 Potential to Achieve Goals: Good ADL Goals Pt Will Perform Grooming: with modified independence;standing Pt Will Transfer to Toilet: with modified independence;ambulating Additional ADL Goal #1: Pt will be supervision consistently with functional ambulation/mobility during ADL   OT Frequency: Min 2X/week   Barriers to D/C: Decreased caregiver support   End of Session Equipment Utilized During Treatment: Gait belt Nurse Communication: Mobility status;Other (comment) (Pt wanting to go to SNF)  Activity Tolerance: Patient tolerated treatment well Patient left: in chair;with call bell/phone within reach;with chair alarm set   Time: 6979-4801 OT Time Calculation (min): 18 min Charges:  OT General Charges $OT Visit: 1 Procedure OT Evaluation $OT Eval Moderate Complexity: 1 Procedure  Edwin Cap , MS, OTR/L, CLT  Pager: [phone]  05/21/2016, 9:31 AM

## 2016-05-21 NOTE — Patient Outreach (Addendum)
This RNCM made call to patient's mother, Mrs. Woodridge [phone]), to coordinate care. Mrs. Woodrige has been given verbal consent by patient to receive and give health related and care coordinated information.  Mrs. Woodridge ask to clarify information patient gave to attending this weekend that she was being discharged to home and would be going to [place] in care of her mother.  Mrs. Woodridge stated information was not true.  Mrs. Woodrige stated she would be having heart surgery herself and would not be able to take care of patient.  Mrs. Woodrige stated further she was told by patient's sister, [name] (who lives locally) stated patient had told her she was being discharged to a rehabilitation center.    Mrs. Woodridge and this RNCM discussed how patient seems to be intentionally giving misinformation to each person she talks to.  Call made to [facility], [facility] Liaison, to advise her of conversation with Mrs. Woodrige. Gerre Scull noted information and plans to communicate this information with acute care care team.  Call also made to [place]ar Haw, [facility] Case Manager to update.

## 2016-05-21 NOTE — Progress Notes (Signed)
PROGRESS NOTE    Madison [name]  GEZ:[id number] DOB: 05/21/1961 DOA: 05/17/2016 PCP: [name], NP  Brief Narrative:Madison [name] is a 55 y.o. female with diabetes mellitus type 1, mechanical aortic valve, CAD status post CABG who was discharged 2 days ago was found to be hypotensive by patient's home health aide. Patient was referred to the ER. The ER patient's blood pressure was in the 80 systolic with heart rate around 130 beats per minute. Patient's lab work show markedly elevated blood sugar with bicarbonate less than 7 lactate more than 6.. Patient was started on IV fluid boluses for DKA and possible sepsis along with IV insulin infusion for DKA. Chest x-ray shows infiltrates concerning for pneumonia. She was felt to have Hypovolemic shock and improved with fluid resuscitation & DKA Tx in ICU  transferred to [facility] today 7/1  Assessment & Plan: 1. Shock -due to hypovolemia, fluid loss from DKA -improved with fluid resuscitation and brief use of pressors in ICU -clinically do not suspect Sepsis -stopped Vanc and cefepime, Repeat CXR negative -Blood Cx negative -resolved  2. DKA -treated with insulin gtt, IVF -very brittle DM, CBGs improved -continue lantus, add 2units meal coverage  3. H/o mechanical aortic valve -was not getting warfarin for some reason and remained on Hep gtt in ICU -restarted Warfarin, bridge with Heparin till INR >2.5 -INR 1.6 today  4. Anxiety/Depression -continue celexa  5. CAD status post CABG - denies any chest pain  6. Cognitive issues -needs more assistance, has home health aide -now agreeable to SNF, CSW and CM working on this  Full Code Communication: none at bedside, no close family Dispo: SNF when INR >2.5  Consultants:  PCCM  Antimicrobials: Vanc/Cefepime   Subjective: Feels ok, no complaints  Objective: Filed Vitals:   05/20/16 2055 05/21/16 0624 05/21/16 1020 05/21/16 1152  BP: 118/59 123/49 145/53 149/61  Pulse: 81 55 62  64  Temp: 98.7 F (37.1 C) 98.6 F (37 C) 98.4 F (36.9 C) 99 F (37.2 C)  TempSrc: Oral Oral Oral Oral  Resp: 17 18 20 18   Height:      Weight:  58.786 kg (129 lb 9.6 oz)    SpO2: 100% 98% 100% 100%    Intake/Output Summary (Last 24 hours) at 05/21/16 1202 Last data filed at 05/21/16 1131  Gross per 24 hour  Intake    960 ml  Output   2025 ml  Net  -1065 ml   Filed Weights   05/19/16 0602 05/20/16 0522 05/21/16 0624  Weight: 64.728 kg (142 lb 11.2 oz) 62.823 kg (138 lb 8 oz) 58.786 kg (129 lb 9.6 oz)    Examination:  General exam: Appears calm and comfortable, AAOx 2-cognitive delay Respiratory system: Clear to auscultation. Respiratory effort normal. Cardiovascular system: S1 & S2 heard, RRR. No JVD, murmurs, rubs, gallops or clicks. No pedal edema. Gastrointestinal system: Abdomen is nondistended, soft and nontender. No organomegaly or masses felt. Normal bowel sounds heard. Central nervous system: Alert and oriented. No focal neurological deficits. Extremities: Symmetric 5 x 5 power. Skin: No rashes, lesions or ulcers Psychiatry: Judgement and insight appear normal. Mood & affect appropriate.     Data Reviewed: I have personally reviewed following labs and imaging studies  CBC:  Recent Labs Lab 05/17/16 0340 05/17/16 0343 05/18/16 0539 05/19/16 1130 05/20/16 0555  WBC 18.0*  --  8.4 4.7 3.6*  NEUTROABS 14.9*  --   --  2.2  --   HGB 9.3* 10.9* 8.5*  9.2* 9.8*  HCT 30.4* 32.0* 26.8* 28.5* 30.9*  MCV 93.5  --  88.7 87.7 90.9  PLT 369  --  311 257 204   Basic Metabolic Panel:  Recent Labs Lab 05/17/16 1933 05/17/16 1935 05/18/16 0539 05/18/16 0728 05/19/16 1130 05/20/16 0555 05/21/16 0231  NA 131*  --   --  136 137 138 138  K 4.2  --   --  3.5 4.7 4.1 3.9  CL 109  --   --  109 109 108 108  CO2 13*  --   --  19* 22 24 25   GLUCOSE 306*  --   --  69 116* 217* 146*  BUN 5*  --   --  <5* <5* <5* <5*  CREATININE 0.84  --   --  0.75 0.57 0.60 0.62   CALCIUM 7.6*  --   --  7.9* 8.3* 8.5* 8.7*  MG 1.5*  --  2.3  --  1.8  --   --   PHOS  --  1.9* 2.7  --  2.3*  --   --    GFR: Estimated Creatinine Clearance: 65.7 mL/min (by C-G formula based on Cr of 0.62). Liver Function Tests:  Recent Labs Lab 05/17/16 0340 05/19/16 1130  AST 21  --   ALT 16  --   ALKPHOS 95  --   BILITOT 1.6*  --   PROT 5.6*  --   ALBUMIN 2.7* 2.0*   No results for input(s): LIPASE, AMYLASE in the last 168 hours. No results for input(s): AMMONIA in the last 168 hours. Coagulation Profile:  Recent Labs Lab 05/17/16 0627 05/18/16 0539 05/19/16 0651 05/20/16 0555 05/21/16 0231  INR 1.17 1.20 1.16 1.19 1.65*   Cardiac Enzymes:  Recent Labs Lab 05/17/16 0627  TROPONINI <0.03   BNP (last 3 results) No results for input(s): PROBNP in the last 8760 hours. HbA1C: No results for input(s): HGBA1C in the last 72 hours. CBG:  Recent Labs Lab 05/20/16 1644 05/20/16 2055 05/21/16 0104 05/21/16 0554 05/21/16 1119  GLUCAP 278* 202* 171* 119* 360*   Lipid Profile: No results for input(s): CHOL, HDL, LDLCALC, TRIG, CHOLHDL, LDLDIRECT in the last 72 hours. Thyroid Function Tests: No results for input(s): TSH, T4TOTAL, FREET4, T3FREE, THYROIDAB in the last 72 hours. Anemia Panel: No results for input(s): VITAMINB12, FOLATE, FERRITIN, TIBC, IRON, RETICCTPCT in the last 72 hours. Urine analysis:    Component Value Date/Time   COLORURINE YELLOW 05/17/2016 0353   APPEARANCEUR CLEAR 05/17/2016 0353   LABSPEC 1.025 05/17/2016 0353   PHURINE 5.0 05/17/2016 0353   GLUCOSEU >1000* 05/17/2016 0353   HGBUR NEGATIVE 05/17/2016 0353   BILIRUBINUR NEGATIVE 05/17/2016 0353   BILIRUBINUR n 06/16/2015 1244   KETONESUR >80* 05/17/2016 0353   PROTEINUR NEGATIVE 05/17/2016 0353   PROTEINUR n 06/16/2015 1244   UROBILINOGEN 0.2 07/30/2015 1415   UROBILINOGEN 0.2 06/16/2015 1244   NITRITE NEGATIVE 05/17/2016 0353   NITRITE n 06/16/2015 1244   LEUKOCYTESUR  NEGATIVE 05/17/2016 0353   Sepsis Labs: @LABRCNTIP (procalcitonin:4,lacticidven:4)  ) Recent Results (from the past 240 hour(s))  Blood Culture (routine x 2)     Status: None (Preliminary result)   Collection Time: 05/17/16  3:30 AM  Result Value Ref Range Status   Specimen Description BLOOD RIGHT ARM  Final   Special Requests BOTTLES DRAWN AEROBIC AND ANAEROBIC  Final   Culture NO GROWTH 4 DAYS  Final   Report Status PENDING  Incomplete  Blood Culture (routine x  2)     Status: None (Preliminary result)   Collection Time: 05/17/16  3:40 AM  Result Value Ref Range Status   Specimen Description BLOOD LEFT WRIST  Final   Special Requests IN PEDIATRIC BOTTLE  Final   Culture NO GROWTH 4 DAYS  Final   Report Status PENDING  Incomplete  Urine culture     Status: None   Collection Time: 05/17/16  3:53 AM  Result Value Ref Range Status   Specimen Description URINE, CATHETERIZED  Final   Special Requests NONE  Final   Culture NO GROWTH  Final   Report Status 05/18/2016 FINAL  Final         Radiology Studies: No results found.      Scheduled Meds: . citalopram  40 mg Oral Daily  . famotidine  40 mg Oral QHS  . furosemide  20 mg Oral Daily  . insulin aspart  0-15 Units Subcutaneous TID WC  . insulin aspart  0-5 Units Subcutaneous QHS  . insulin glargine  5 Units Subcutaneous Daily  . pantoprazole  40 mg Oral Q1200  . pravastatin  20 mg Oral Daily  . Warfarin - Pharmacist Dosing Inpatient   Does not apply q1800   Continuous Infusions: . heparin 950 Units/hr (05/21/16 0330)     LOS: 4 days    Time spent:    Zannie Cove, MD [facility] Pager [phone]  If 7PM-7AM, please contact night-coverage [email or web address] Password [email or web address] 05/21/2016, 12:02 PM

## 2016-05-21 NOTE — NC FL2 (Signed)
[place] MEDICAID FL2 LEVEL OF CARE SCREENING TOOL     IDENTIFICATION  Patient Name: Madison [name] Birthdate: 25-Feb-1961 Sex: female Admission Date (Current Location): 05/17/2016  [facility] and [place] Number:  Producer, television/film/video and Address:  [facility], [address], [place], [place] [address]      Provider Number: [id number]  Attending Physician Name and Address:  Zannie Cove, MD  Relative Name and Phone Number:       Current Level of Care: Hospital Recommended Level of Care: Skilled Nursing Facility Prior Approval Number:    Date Approved/Denied:   PASRR Number: [id number]  Discharge Plan: SNF    Current Diagnoses: Patient Active Problem List   Diagnosis Date Noted  . MDD (major depressive disorder), recurrent episode, moderate (HCC)   . DKA (diabetic ketoacidoses) (HCC) 05/17/2016  . Aspiration pneumonia (HCC) 05/17/2016  . Diabetic ketoacidosis without coma (HCC)   . Sepsis (HCC)   . 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 MSOF (multiple systems organ failure) 05/01/2016  . Acute hypoxemic respiratory failure (HCC)   . Type I diabetes mellitus with complication, uncontrolled (HCC) 03/29/2016  . Adjustment disorder with mixed anxiety and depressed mood 03/28/2016  . HLD (hyperlipidemia) 03/27/2016  . AKI (acute kidney injury) (HCC) 03/27/2016  . Chronic combined systolic and diastolic congestive heart failure (HCC)   . Diabetic ketoacidosis (HCC) 03/26/2016  . Type 1 diabetes, HbA1c goal < 7% (HCC)   . Hypotension 03/08/2016  . Uncontrolled type 1 diabetes mellitus with ketoacidotic coma (HCC)   . Acute encephalopathy 02/28/2016  . Hyponatremia: Pseudo 02/28/2016  . Lactic acidosis   . Diabetic ketoacidosis with coma associated with type 1 diabetes mellitus (HCC)   . Hypoglycemia   . Chronic systolic heart failure (HCC)   . Esophageal reflux   . S/P AVR (aortic valve replacement)   . ARF (acute renal failure) (HCC)  01/22/2016  . CHF (congestive heart failure) (HCC) 01/22/2016  . Diabetic ketoacidosis without coma associated with type 1 diabetes mellitus (HCC)   . DKA, type 1 (HCC) 11/23/2015  . Constipation 11/23/2015  . Leukocytosis   . Pressure ulcer 08/02/2015  . Diabetic ketoacidosis with coma associated with other specified diabetes mellitus (HCC)   . Closed left ankle fracture 07/01/2015  . Trimalleolar fracture of left ankle 07/01/2015  . Hyperkalemia   . Insomnia 06/16/2015  . Normocytic anemia 01/31/2015  . Acute on chronic combined systolic and diastolic CHF (congestive heart failure) (HCC)   . Essential hypertension   . Elevated troponin   . Diabetes type 1, uncontrolled (HCC)   . Demand ischemia (HCC)   . S/P aortic valve replacement   . Hypokalemia   . NSTEMI (non-ST elevated myocardial infarction) (HCC) 12/08/2014  . Blood poisoning (HCC)   . Supratherapeutic INR   . Severe sepsis with acute organ dysfunction (HCC) 12/03/2014  . Spastic hemiplegia affecting nondominant side (HCC) 11/29/2014  . Dysphagia, pharyngoesophageal phase 10/04/2014  . Sinus tachycardia (HCC) 09/30/2014  . Depression 04/29/2014  . CVA (cerebral infarction) 03/12/2014  . Acute respiratory failure (HCC) 03/12/2014  . Chronic anticoagulation 07/28/2013  . Long term (current) use of anticoagulants 07/28/2013  . CAD (coronary artery disease)   . Aortic stenosis   . Hyperlipidemia   . Rheumatoid arthritis (HCC)     Orientation RESPIRATION BLADDER Height & Weight     Self, Time, Situation, Place  Normal Continent Weight: 129 lb 9.6 oz (58.786 kg) (b scale; pt can  stand on standing scale now) Height:  5\' 3"  (160 cm)  BEHAVIORAL SYMPTOMS/MOOD NEUROLOGICAL BOWEL NUTRITION STATUS   (None)  (None) Continent Diet (Carb modified)  AMBULATORY STATUS COMMUNICATION OF NEEDS Skin   Limited Assist Verbally Normal                       Personal Care Assistance Level of Assistance  Bathing, Feeding,  Dressing Bathing Assistance: Limited assistance Feeding assistance: Independent Dressing Assistance: Limited assistance     Functional Limitations Info  Sight, Hearing, Speech Sight Info: Adequate Hearing Info: Adequate Speech Info: Impaired (Slurred/Dysarthria)    SPECIAL CARE FACTORS FREQUENCY  PT (By licensed PT), OT (By licensed OT), Blood pressure, Diabetic urine testing     PT Frequency: 5 x week OT Frequency: 5 x week            Contractures Contractures Info: Not present    Additional Factors Info  Code Status, Allergies, Psychotropic Code Status Info: Full Allergies Info: Adhesive, Celebrex, Detrol Psychotropic Info: Depression: Celexa 40 mg PO daily         Current Medications (05/21/2016):  This is the current hospital active medication list Current Facility-Administered Medications  Medication Dose Route Frequency Provider Last Rate Last Dose  . citalopram (CELEXA) tablet 40 mg  40 mg Oral Daily 07/22/2016, NP   40 mg at 05/21/16 0940  . famotidine (PEPCID) tablet 40 mg  40 mg Oral QHS 07/22/16, MD   40 mg at 05/20/16 2117  . furosemide (LASIX) tablet 20 mg  20 mg Oral Daily 2118, MD   20 mg at 05/21/16 0940  . heparin ADULT infusion 100 units/mL (25000 units/268mL sodium chloride 0.45%)  950 Units/hr Intravenous Continuous 45m, RPH 9.5 mL/hr at 05/21/16 0330 950 Units/hr at 05/21/16 0330  . insulin aspart (novoLOG) injection 0-15 Units  0-15 Units Subcutaneous TID WC 01-18-1985, MD   8 Units at 05/20/16 1800  . insulin aspart (novoLOG) injection 0-5 Units  0-5 Units Subcutaneous QHS 07/21/16, MD   Stopped at 05/18/16 2205  . insulin glargine (LANTUS) injection 5 Units  5 Units Subcutaneous Daily 2206, MD   5 Units at 05/20/16 2118  . ondansetron (ZOFRAN) injection 4 mg  4 mg Intravenous Q6H PRN 2119, NP      . pantoprazole (PROTONIX) EC tablet 40 mg  40 mg Oral Q1200 Eugenie Norrie, MD   40  mg at 05/20/16 1241  . pravastatin (PRAVACHOL) tablet 20 mg  20 mg Oral Daily 07/21/16, MD   20 mg at 05/21/16 0939  . Warfarin - Pharmacist Dosing Inpatient   Does not apply q1800 07/22/16, [facility]         Discharge Medications: Please see discharge summary for a list of discharge medications.  Relevant Imaging Results:  Relevant Lab Results:   Additional Information SS#: [facility]  [id number], LCSW

## 2016-05-21 NOTE — Care Management Important Message (Signed)
Important Message  Patient Details  Name: Madison [name] MRN: [id number] Date of Birth: 1961/10/25   Medicare Important Message Given:  Yes    Estevan Kersh Abena 05/21/2016, 10:53 AM

## 2016-05-21 NOTE — Progress Notes (Signed)
ANTICOAGULATION CONSULT NOTE - Follow Up Consult  Pharmacy Consult for heparin  Indication: mechanical aortic valve  Allergies  Allergen Reactions  . Adhesive [Tape] Other (See Comments)    Can burn skin if left on too long  . Celebrex [Celecoxib] Rash  . Detrol [Tolterodine] Hives    Patient Measurements: Height: 5\' 3"  (160 cm) Weight: 129 lb 9.6 oz (58.786 kg) (b scale; pt can stand on standing scale now) IBW/kg (Calculated) : 52.4 Heparin Dosing Weight: 66.4 kg  Vital Signs: Temp: 99 F (37.2 C) (07/03 1152) Temp Source: Oral (07/03 1152) BP: 149/61 mmHg (07/03 1152) Pulse Rate: 64 (07/03 1152)  Labs:  Recent Labs  05/19/16 0651  05/19/16 1130 05/20/16 0555 05/21/16 0231 05/21/16 1011 05/21/16 1859  HGB  --   --  9.2* 9.8*  --   --   --   HCT  --   --  28.5* 30.9*  --   --   --   PLT  --   --  257 204  --   --   --   LABPROT 15.0  --   --  15.2 19.5*  --   --   INR 1.16  --   --  1.19 1.65*  --   --   HEPARINUNFRC  --   < >  --  0.45 0.20* 0.58 0.56  CREATININE  --   --  0.57 0.60 0.62  --   --   < > = values in this interval not displayed.  Estimated Creatinine Clearance: 65.7 mL/min (by C-G formula based on Cr of 0.62).   Medications:  Scheduled:  . citalopram  40 mg Oral Daily  . famotidine  40 mg Oral QHS  . furosemide  20 mg Oral Daily  . insulin aspart  0-15 Units Subcutaneous TID WC  . insulin aspart  0-5 Units Subcutaneous QHS  . insulin aspart  2 Units Subcutaneous TID WC  . insulin glargine  5 Units Subcutaneous Daily  . pantoprazole  40 mg Oral Q1200  . pravastatin  20 mg Oral Daily  . Warfarin - Pharmacist Dosing Inpatient   Does not apply q1800   Infusions:  . heparin 950 Units/hr (05/21/16 0330)    Assessment: 55 yo f on warfarin PTA for AVR. Pharmacy is consulted to dose warfarin and heparin while INR subtherapeutic.   Heparin level therapeutic x 2  Goal of Therapy:  INR 2.5-3.5 Heparin level 0.3-0.7 units/ml Monitor platelets  by anticoagulation protocol: Yes   Plan:  Continue heparin at 950 units / hr Follow up AM labs  Thank you 53, PharmD [phone]  05/21/2016, 7:58 PM

## 2016-05-22 LAB — GLUCOSE, CAPILLARY
GLUCOSE-CAPILLARY: 126 mg/dL — AB (ref 65–99)
GLUCOSE-CAPILLARY: 148 mg/dL — AB (ref 65–99)
GLUCOSE-CAPILLARY: 235 mg/dL — AB (ref 65–99)
Glucose-Capillary: 173 mg/dL — ABNORMAL HIGH (ref 65–99)

## 2016-05-22 LAB — CULTURE, BLOOD (ROUTINE X 2)
Culture: NO GROWTH
Culture: NO GROWTH

## 2016-05-22 LAB — BASIC METABOLIC PANEL
Anion gap: 6 (ref 5–15)
CHLORIDE: 106 mmol/L (ref 101–111)
CO2: 26 mmol/L (ref 22–32)
Calcium: 8.8 mg/dL — ABNORMAL LOW (ref 8.9–10.3)
Creatinine, Ser: 0.63 mg/dL (ref 0.44–1.00)
GFR calc Af Amer: >60 mL/min (ref >60)
GFR calc non Af Amer: >60 mL/min (ref >60)
GLUCOSE: 94 mg/dL (ref 65–99)
POTASSIUM: 3.8 mmol/L (ref 3.5–5.1)
Sodium: 138 mmol/L (ref 135–145)

## 2016-05-22 LAB — PROTIME-INR
INR: 2.71 — ABNORMAL HIGH (ref 0.00–1.49)
PROTHROMBIN TIME: 28.4 s — AB (ref 11.6–15.2)

## 2016-05-22 LAB — HEPARIN LEVEL (UNFRACTIONATED): Heparin Unfractionated: 0.76 IU/mL — ABNORMAL HIGH (ref 0.30–0.70)

## 2016-05-22 MED ORDER — INSULIN DETEMIR 100 UNIT/ML ~~LOC~~ SOLN: 6.0000 [IU] | Freq: Every day | SUBCUTANEOUS | Status: DC

## 2016-05-22 MED ORDER — WARFARIN SODIUM 5 MG PO TABS: 5.0000 mg | ORAL_TABLET | Freq: Every day | ORAL | Status: DC

## 2016-05-22 MED ORDER — WARFARIN SODIUM 5 MG PO TABS
5.0000 mg | ORAL_TABLET | Freq: Once | ORAL | Status: AC
  Administered 2016-05-22: 5 mg via ORAL
  Filled 2016-05-22: qty 1

## 2016-05-22 MED ORDER — FUROSEMIDE 20 MG PO TABS: 20.0000 mg | ORAL_TABLET | Freq: Every day | ORAL | Status: DC

## 2016-05-22 MED ORDER — INSULIN ASPART 100 UNIT/ML ~~LOC~~ SOLN
2.0000 [IU] | Freq: Three times a day (TID) | SUBCUTANEOUS | Status: DC
Start: 2016-05-22 — End: 2016-05-27

## 2016-05-22 NOTE — Discharge Summary (Signed)
Physician Discharge Summary  Madison [name] MBW:[id number] DOB: March 14, 1961 DOA: 05/17/2016  PCP: Dorothyann Peng, NP  Admit date: 05/17/2016 Discharge date: 05/22/2016  Time spent: 35 minutes  Recommendations for Outpatient Follow-up:  1. PCP in 1 week, monitor, INR goal 2.5-3.5 for mechanical aortic valve 2. Please check B met In 1 week   Discharge Diagnoses:  Principal Problem:   Diabetic ketoacidosis without coma associated with type 1 diabetes mellitus ([place])   Hypovolemic shock   CAD (coronary artery disease)   DKA, type 1 (HCC)   Acute diastolic CHF   S/P AVR (aortic valve replacement)   AKI (acute kidney injury) ([place])   DKA (diabetic ketoacidoses) (HCC)   Aspiration pneumonia ([place])   MDD (major depressive disorder), recurrent episode, moderate ([place])   Discharge Condition: stable  Diet recommendation: low sodium, diabetic  Filed Weights   05/20/16 0522 05/21/16 0624 05/22/16 0513  Weight: 62.823 kg (138 lb 8 oz) 58.786 kg (129 lb 9.6 oz) 57.561 kg (126 lb 14.4 oz)    History of present illness:  Madison [name] is a 55 y.o. female with diabetes mellitus type 1, mechanical aortic valve, CAD status post CABG who was discharged 2 days ago was found to be hypotensive by patient's home health aide. Patient was referred to the ER. The ER patient's blood pressure was in the 80 systolic with heart rate around 130 beats per minute. Patient's lab work show markedly elevated blood sugar with bicarbonate less than 7 lactate more than 6.. Patient was started on IV fluid boluses for DKA and possible sepsis along with IV insulin infusion for DKA  Hospital Course:  1. Shock -due to hypovolemia, fluid loss from DKA -improved with fluid resuscitation and brief use of pressors in ICU -clinically no evidence of Sepsis -was admitted to the ICU initially, when she clinically stabilized was transferred to the floor to hospitalists service -stopped Vanc and cefepime, Repeat CXR negative -Blood Cx  negative -resolved  2. DKA -treated with insulin gtt, IVF -very brittle DM, CBGs improved -continue lantus, add 2units meal coverage and sliding-scale  3. H/o mechanical aortic valve -was not getting warfarin for some reason and remained on Hep gtt in ICU -restarted Warfarin, bridge with Heparin till INR >2.5 -INR 2.7 today , goal INR is 2.5-3.5  4. Anxiety/Depression -continue celexa  5. CAD status post CABG - denies any chest pain  6. Cognitive issues -needs more assistance, has home health aide -now agreeable to SNF   Discharge Exam: Filed Vitals:   05/22/16 0513 05/22/16 1129  BP: 129/66 143/60  Pulse: 75 71  Temp: 98.2 F (36.8 C) 98.7 F (37.1 C)  Resp: 20 16    General: AAOx3 Cardiovascular: S1S2/RRR Respiratory: CTAB  Discharge Instructions   Discharge Instructions    Diet - low sodium heart healthy    Complete by:  As directed      Diet Carb Modified    Complete by:  As directed      Increase activity slowly    Complete by:  As directed           Current Discharge Medication List    START taking these medications   Details  furosemide (LASIX) 20 MG tablet Take 1 tablet (20 mg total) by mouth daily.    insulin aspart (NOVOLOG) 100 UNIT/ML injection Inject 2 Units into the skin 3 (three) times daily with meals. Qty: 10 mL, Refills: 11      CONTINUE these medications which have CHANGED  Details  insulin detemir (LEVEMIR) 100 UNIT/ML injection Inject 0.06 mLs (6 Units total) into the skin daily. Refills: 1   Associated Diagnoses: Uncontrolled type 1 diabetes mellitus with complication (HCC)    warfarin (COUMADIN) 5 MG tablet Take 1 tablet (5 mg total) by mouth daily at 6 PM. Titrate for INR 2.5-3.5      CONTINUE these medications which have NOT CHANGED   Details  citalopram (CELEXA) 40 MG tablet Take 1 tablet (40 mg total) by mouth daily. Qty: 90 tablet, Refills: 3   Associated Diagnoses: Depression    insulin aspart (NOVOLOG FLEXPEN)  100 UNIT/ML FlexPen Inject 0-9 Units into the skin 3 (three) times daily with meals. CBG < 70: eat or drink something and recheck, CBG 70 - 120: 0 units CBG 121 - 150: 2 unit CBG 151 - 200: 3 units CBG 201 - 250: 5 units CBG 251 - 300:  6units CBG 301 - 350:  7units CBG 351 - 400: 10 units CBG > 400: call MD. Otho Darner: 15 mL, Refills: 0   Associated Diagnoses: Uncontrolled type 1 diabetes mellitus with complication (HCC)    omeprazole (PRILOSEC) 40 MG capsule TAKE ONE CAPSULE BY MOUTH EVERY DAY Qty: 90 capsule, Refills: 1    ondansetron (ZOFRAN) 4 MG tablet Take 4 mg by mouth every 4 (four) hours as needed for nausea or vomiting. Reported on 05/16/2016    pravastatin (PRAVACHOL) 20 MG tablet Take 20 mg by mouth daily.    Blood Glucose Monitoring Suppl ([place]) w/Device KIT Use to check glucose 2x per day Qty: 1 each, Refills: 0    glucose blood test strip Use as instructed Qty: 100 each, Refills: 12      STOP taking these medications     HYDROcodone-acetaminophen (NORCO/VICODIN) 5-325 MG tablet      traZODone (DESYREL) 50 MG tablet        Allergies  Allergen Reactions  . Adhesive [Tape] Other (See Comments)    Can burn skin if left on too long  . Celebrex [Celecoxib] Rash  . Detrol [Tolterodine] Hives      The results of significant diagnostics from this hospitalization (including imaging, microbiology, ancillary and laboratory) are listed below for reference.    Significant Diagnostic Studies: Dg Chest 2 View  05/19/2016  CLINICAL DATA:  Followup right upper lobe infiltrate. EXAM: CHEST  2 VIEW COMPARISON:  05/17/2016 FINDINGS: Cardiac shadow is within normal limits. Postsurgical changes are again seen. The lungs are well aerated without focal infiltrate. Mild interstitial edema is seen. Small bilateral pleural with. IMPRESSION: Mild interstitial edema and small pleural effusions. Electronically Signed   By: Inez Catalina M.D.   On: 05/19/2016 10:19   Ct  Head Wo Contrast  04/30/2016  CLINICAL DATA:  Acute onset of shortness of breath. Initial encounter. EXAM: CT HEAD WITHOUT CONTRAST TECHNIQUE: Contiguous axial images were obtained from the base of the skull through the vertex without intravenous contrast. COMPARISON:  CT of the head performed 02/26/2016 FINDINGS: There is no evidence of acute infarction, mass lesion, or intra- or extra-axial hemorrhage on CT. Prominence of the ventricles and sulci reflects mild to moderate cortical volume loss. 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 Diffuse periventricular and subcortical white matter change likely reflects small vessel ischemic microangiopathy. Small chronic infarcts are noted at the cerebellar hemispheres bilaterally, more prominent on the left. The brainstem and  fourth ventricle are within normal limits. The basal ganglia are unremarkable in appearance. No mass effect or midline shift is seen. There is no evidence of fracture; visualized osseous structures are unremarkable in appearance. The orbits are within normal limits. The paranasal sinuses and mastoid air cells are well-aerated. No significant soft tissue abnormalities are seen. IMPRESSION: 1. No acute intracranial pathology seen on CT. 2. Mild to moderate cortical volume loss and diffuse small vessel ischemic microangiopathy. 3. 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 4. Small chronic infarcts at the cerebellar hemispheres bilaterally, more prominent on the left. Electronically Signed   By: Garald Balding M.D.   On: 04/30/2016 23:43   Dg Chest Port 1 View  05/17/2016  CLINICAL DATA:  Hyperglycemia and hypotension EXAM: PORTABLE CHEST 1 VIEW COMPARISON:  05/10/2016 FINDINGS: Mild airspace opacity in the right upper  lobe may represent early infectious infiltrate or aspiration. The left lung is clear. There is no large effusion. The pulmonary vasculature is normal. IMPRESSION: Question early infectious infiltrate or aspiration in the right upper lobe. Electronically Signed   By: Andreas Newport M.D.   On: 05/17/2016 03:36   Dg Chest Port 1 View  05/10/2016  CLINICAL DATA:  Diabetic ketoacidosis. EXAM: PORTABLE CHEST 1 VIEW COMPARISON:  05/02/2016 FINDINGS: Sternotomy wires are unchanged. Interval removal of nasogastric tube and endotracheal tube. Lungs are adequately inflated and otherwise clear. Cardiomediastinal silhouette and remainder of the exam is unchanged. IMPRESSION: No active disease. Electronically Signed   By: Marin Olp M.D.   On: 05/10/2016 13:39   Dg Chest Port 1 View  05/02/2016  CLINICAL DATA:  Hypoxia EXAM: PORTABLE CHEST 1 VIEW COMPARISON:  April 30, 2016 FINDINGS: Endotracheal tube tip is in the right main bronchus. Nasogastric tube tip and side port are in the stomach. No pneumothorax. There is no edema or consolidation. Heart size and pulmonary vascular normal. No adenopathy. IMPRESSION: Endotracheal tube tip in proximal right main bronchus. Advise withdrawing endotracheal tube approximately 4 to 4.5 cm. No pneumothorax. No edema or consolidation. Critical Value/emergent results were called by telephone at the time of interpretation on 05/02/2016 at 7:01 am to Arletta Bale, RN, who verbally acknowledged these results. Electronically Signed   By: Lowella Grip III M.D.   On: 05/02/2016 07:02   Dg Chest Portable 1 View  04/30/2016  CLINICAL DATA:  Unresponsive patient. Endotracheal tube and enteric tubes were placed. Shortness of breath. EXAM: PORTABLE CHEST 1 VIEW COMPARISON:  03/26/2016 FINDINGS: Postoperative changes in the mediastinum. An endotracheal tube has been placed with tip measuring 3 cm above the carina. An enteric tube has been placed with tip in the left upper quadrant  consistent with location in the upper stomach. Normal heart size and pulmonary vascularity. No focal airspace disease or consolidation in the lungs. No blunting of costophrenic angles. No pneumothorax. Mediastinal contours appear intact. IMPRESSION: Appliances appear to be in satisfactory position. No evidence of active pulmonary disease. Electronically Signed   By: Lucienne Capers M.D.   On: 04/30/2016 23:15    Microbiology: Recent Results (from the past 240 hour(s))  Blood Culture (routine x 2)     Status: None   Collection Time: 05/17/16  3:30 AM  Result Value Ref Range Status   Specimen Description BLOOD RIGHT ARM  Final   Special Requests BOTTLES DRAWN AEROBIC AND ANAEROBIC 5ML  Final   Culture NO GROWTH 5 DAYS  Final   Report Status 05/22/2016 FINAL  Final  Blood Culture (routine x 2)     Status: None   Collection Time: 05/17/16  3:40 AM  Result Value Ref Range Status   Specimen Description BLOOD LEFT WRIST  Final   Special Requests IN PEDIATRIC BOTTLE 3ML  Final   Culture NO GROWTH 5 DAYS  Final   Report Status 05/22/2016 FINAL  Final  Urine culture     Status: None   Collection Time: 05/17/16  3:53 AM  Result Value Ref Range Status   Specimen Description URINE, CATHETERIZED  Final   Special Requests NONE  Final   Culture NO GROWTH  Final   Report Status 05/18/2016 FINAL  Final     Labs: Basic Metabolic Panel:  Recent Labs Lab 05/17/16 1933 05/17/16 1935 05/18/16 0539 05/18/16 0728 05/19/16 1130 05/20/16 0555 05/21/16 0231 05/22/16 0853  NA 131*  --   --  136 137 138 138 138  K 4.2  --   --  3.5 4.7 4.1 3.9 3.8  CL 109  --   --  109 109 108 108 106  CO2 13*  --   --  19* _0 GLUCOSE 306*  --   --  69 116* 217* 146* 94  BUN 5*  --   --  <5* <5* <5* <5* <5*  CREATININE 0.84  --   --  0.75 0.57 0.60 0.62 0.63  CALCIUM 7.6*  --   --  7.9* 8.3* 8.5* 8.7* 8.8*  MG 1.5*  --  2.3  --  1.8  --   --   --   PHOS  --  1.9* 2.7  --  2.3*  --   --   --    Liver  Function Tests:  Recent Labs Lab 05/17/16 0340 05/19/16 1130  AST 21  --   ALT 16  --   ALKPHOS 95  --   BILITOT 1.6*  --   PROT 5.6*  --   ALBUMIN 2.7* 2.0*   No results for input(s): LIPASE, AMYLASE in the last 168 hours. No results for input(s): AMMONIA in the last 168 hours. CBC:  Recent Labs Lab 05/17/16 0340 05/17/16 0343 05/18/16 0539 05/19/16 1130 05/20/16 0555  WBC 18.0*  --  8.4 4.7 3.6*  NEUTROABS 14.9*  --   --  2.2  --   HGB 9.3* 10.9* 8.5* 9.2* 9.8*  HCT 30.4* 32.0* 26.8* 28.5* 30.9*  MCV 93.5  --  88.7 87.7 90.9  PLT 369  --  311 257 204   Cardiac Enzymes:  Recent Labs Lab 05/17/16 0627  TROPONINI <0.03   BNP: BNP (last 3 results)  Recent Labs  07/30/15 1400 03/17/16 1801 03/27/16 0832  BNP 398.4* 95.3 73.7    ProBNP (last 3 results) No results for input(s): PROBNP in the last 8760 hours.  CBG:  Recent Labs Lab 05/21/16 1119 05/21/16 1726 05/21/16 2058 05/22/16 0605 05/22/16 1127  GLUCAP 360* 95 311* 173* 126*       Signed:  Naomie Crow MD.  [facility] 05/22/2016, 1:33 PM

## 2016-05-22 NOTE — Progress Notes (Signed)
Physical Therapy Treatment Patient Details Name: Madison [name] MRN: [id number] DOB: 09/21/1961 Today's Date: 05/22/2016    History of Present Illness This is a 55 yo female with frequent admissions secondary to DKA with PMH of Type I DM, Mechanic aortic valve replacement (takes coumadin), CHF, CAD, Obesity, Hypercholesteremia, HTN, Dyslipidemia, Stroke syndrome, GERD, Anxiety, Depression, Rheumatoid arthritis, Aortic Stenosis, Thrombophlebitis, Heart Murmur, MI, and Pneumonia. She presented to [facility] ER on 05/17/2016 with c/o hyperglycemia, onset 2 days ago blood sugars ranging between 400 to 500's. dizzy and fall at home day before admission.     PT Comments    Making progress with functional mobility and activity tolerance; Noting now agreeing to SNF, which I heartily endorse to maximize independence and safety with mobility prior to dc home  Follow Up Recommendations  SNF     Equipment Recommendations  Rolling walker with 5" wheels    Recommendations for Other Services       Precautions / Restrictions Precautions Precautions: Fall Restrictions Weight Bearing Restrictions: No    Mobility  Bed Mobility Overal bed mobility: Needs Assistance Bed Mobility: Supine to Sit;Sit to Supine     Supine to sit: Supervision Sit to supine: Supervision   General bed mobility comments: Overall moving well; cues for optimal positioning in bed (after lying down for labs to be drawn)  Transfers Overall transfer level: Needs assistance Equipment used: 1 person hand held assist Transfers: Sit to/from Stand Sit to Stand: Min assist         General transfer comment: Cues for scooting to edge of chair in prep for standing up; cues for hand placement; min assist to help L hand with RW grip  Ambulation/Gait Ambulation/Gait assistance: Min guard;Min assist Ambulation Distance (Feet): 140 Feet Assistive device: Rolling walker (2 wheeled) Gait Pattern/deviations: Step-through  pattern;Decreased dorsiflexion - right;Decreased stance time - left;Decreased step length - right Gait velocity: slower   General Gait Details: min guard for safety with RW; requiring min assist for straight pathfinding; noting tendency to list L and collide with objects on L; min assist for RW management   Stairs            Wheelchair Mobility    Modified Rankin (Stroke Patients Only)       Balance     Sitting balance-Leahy Scale: Good       Standing balance-Leahy Scale: Fair                      Cognition Arousal/Alertness: Awake/alert Behavior During Therapy: WFL for tasks assessed/performed Overall Cognitive Status: Within Functional Limits for tasks assessed (for simple mobility tasks)                      Exercises      General Comments        Pertinent Vitals/Pain Pain Assessment: No/denies pain    Home Living                      Prior Function            PT Goals (current goals can now be found in the care plan section) Acute Rehab PT Goals Patient Stated Goal: Now agreeable to SNF PT Goal Formulation: With patient Time For Goal Achievement: 06/03/16 Potential to Achieve Goals: Good Progress towards PT goals: Progressing toward goals    Frequency  Min 3X/week    PT Plan Discharge plan needs to be updated  Co-evaluation             End of Session Equipment Utilized During Treatment: Gait belt Activity Tolerance: Patient tolerated treatment well Patient left: in chair;with call bell/phone within reach;with chair alarm set     Time: [phone] (minus approx 5 minutes for lab draw) PT Time Calculation (min) (ACUTE ONLY): 21 min  Charges:  $Gait Training: 8-22 mins                    G Codes:      [name] 05/22/2016, 9:25 AM  [name], PT  Acute Rehabilitation Services Pager [phone] Office [phone]

## 2016-05-22 NOTE — Progress Notes (Signed)
ANTICOAGULATION CONSULT NOTE - Follow Up Consult  Pharmacy Consult for heparin/warfarin Indication: mechanical aortic valve  Allergies  Allergen Reactions  . Adhesive [Tape] Other (See Comments)    Can burn skin if left on too long  . Celebrex [Celecoxib] Rash  . Detrol [Tolterodine] Hives    Patient Measurements: Height: '5\' 3"'  (160 cm) Weight: 126 lb 14.4 oz (57.561 kg) (scale b) IBW/kg (Calculated) : 52.4  Vital Signs: Temp: 98.2 F (36.8 C) (07/04 0513) Temp Source: Oral (07/04 0513) BP: 129/66 mmHg (07/04 0513) Pulse Rate: 75 (07/04 0513)  Labs:  Recent Labs  05/19/16 1130  05/20/16 0555 05/21/16 0231 05/21/16 1011 05/21/16 1859 05/22/16 0853  HGB 9.2*  --  9.8*  --   --   --   --   HCT 28.5*  --  30.9*  --   --   --   --   PLT 257  --  204  --   --   --   --   LABPROT  --   --  15.2 19.5*  --   --  28.4*  INR  --   --  1.19 1.65*  --   --  2.71*  HEPARINUNFRC  --   < > 0.45 0.20* 0.58 0.56 0.76*  CREATININE 0.57  --  0.60 0.62  --   --  0.63  < > = values in this interval not displayed.  Estimated Creatinine Clearance: 65.7 mL/min (by C-G formula based on Cr of 0.63).   Medications:  Prescriptions prior to admission  Medication Sig Dispense Refill Last Dose  . citalopram (CELEXA) 40 MG tablet Take 1 tablet (40 mg total) by mouth daily. 90 tablet 3 05/15/2016  . HYDROcodone-acetaminophen (NORCO/VICODIN) 5-325 MG tablet Take 1 tablet by mouth every 6 (six) hours as needed for moderate pain. Reported on 05/16/2016   3 days ago  . insulin aspart (NOVOLOG FLEXPEN) 100 UNIT/ML FlexPen Inject 0-9 Units into the skin 3 (three) times daily with meals. CBG < 70: eat or drink something and recheck, CBG 70 - 120: 0 units CBG 121 - 150: 2 unit CBG 151 - 200: 3 units CBG 201 - 250: 5 units CBG 251 - 300:  6units CBG 301 - 350:  7units CBG 351 - 400: 10 units CBG > 400: call MD. (Patient taking differently: Inject 5 Units into the skin 3 (three) times daily with meals.  ) 15 mL 0 05/15/2016 at lunch  . insulin detemir (LEVEMIR) 100 UNIT/ML injection Inject 0.1 mLs (10 Units total) into the skin daily. 10 mL 1 05/15/2016  . omeprazole (PRILOSEC) 40 MG capsule TAKE ONE CAPSULE BY MOUTH EVERY DAY 90 capsule 1 2 days ago  . ondansetron (ZOFRAN) 4 MG tablet Take 4 mg by mouth every 4 (four) hours as needed for nausea or vomiting. Reported on 05/16/2016   05/14/2016  . pravastatin (PRAVACHOL) 20 MG tablet Take 20 mg by mouth daily.   05/15/2016  . traZODone (DESYREL) 50 MG tablet Take 50 mg by mouth at bedtime. Reported on 05/16/2016  0 05/14/2016  . warfarin (COUMADIN) 7.5 MG tablet Take 1 tablet (7.5 mg total) by mouth daily at 6 PM. (Patient taking differently: Take 7.5 mg by mouth at bedtime. ) 30 tablet 1 05/15/2016  . Blood Glucose Monitoring Suppl ([place]) w/Device KIT Use to check glucose 2x per day 1 each 0 Taking  . glucose blood test strip Use as instructed (Patient taking differently: 1 each  by Other route as needed (as instructed). Use as instructed) 100 each 12 Taking    Assessment: 55 yo F admitted 05/17/2016 on warfarin PTA for mechanical aortic valve. INR resumed with heparin bridge on 7/1.   INR 2.71 (therapeutic), HL 0.76 (supratherapeutic), Hgb 9.8 stable, Plt wnl. No s/sx of bleeding noted. Due to quick rise in INR will give smaller dose of warfarin tonight.  Home warfarin dose: 7.5 mg daily  Goal of Therapy:  INR 2.5-3.5 Monitor platelets by anticoagulation protocol: Yes   Plan:  Discontinue heparin, now that INR > 2.5 Warfarin 5 mg PO x 1 tonight  Monitor daily INR, CBC and s/sx of bleeding  Dimitri Ped, PharmD. PGY-1 Pharmacy Resident Pager: [phone] 05/22/2016,11:06 AM

## 2016-05-23 ENCOUNTER — Other Ambulatory Visit: Payer: Self-pay

## 2016-05-23 ENCOUNTER — Other Ambulatory Visit: Payer: Self-pay | Admitting: *Deleted

## 2016-05-23 DIAGNOSIS — I5022 Chronic systolic (congestive) heart failure: Secondary | ICD-10-CM

## 2016-05-23 LAB — GLUCOSE, CAPILLARY
GLUCOSE-CAPILLARY: 83 mg/dL (ref 65–99)
GLUCOSE-CAPILLARY: 137 mg/dL — AB (ref 65–99)
Glucose-Capillary: 376 mg/dL — ABNORMAL HIGH (ref 65–99)
Glucose-Capillary: 410 mg/dL — ABNORMAL HIGH (ref 65–99)

## 2016-05-23 MED ORDER — WARFARIN SODIUM 7.5 MG PO TABS
7.5000 mg | ORAL_TABLET | Freq: Once | ORAL | Status: AC
  Administered 2016-05-23: 7.5 mg via ORAL
  Filled 2016-05-23: qty 1

## 2016-05-23 NOTE — Patient Outreach (Signed)
[facility]) Care Management  05/23/2016  Madison [name] [birth year] [id number]  CSW made an attempt to try and contact patient today, but then quickly realized that patient continues to reside inpatient at [facility].  CSW performed a thorough review of patient's chart to obtain an update on patient's status.  According to notes documented by Charlynn Court, [facility] Social Worker, patient will be going to [facility], to receive short-term rehabilitative services, when medically stable and ready for discharge from the hospital.  CSW will continue to follow along while patient remains in the hospital, as well as when she is placed at the skilled facility.  CSW has already left a message for Genia Harold at [facility] and is currently awaiting a return call. Danford Bad, BSW, MSW, LCSW  Licensed Restaurant manager, [employer] [facility]  Mailing [address] [place], [place] [address] Physical Address-[address], [place], [place] [address] Toll Free Main # [phone] Fax # [phone] Cell # [phone]  Fax # [phone]  [name]

## 2016-05-23 NOTE — Progress Notes (Signed)
Inpatient Diabetes Program Recommendations  AACE/ADA: New Consensus Statement on Inpatient Glycemic Control (2015)  Target Ranges:  Prepandial:   less than 140 mg/dL      Peak postprandial:   less than 180 mg/dL (1-2 hours)      Critically ill patients:  140 - 180 mg/dL   Lab Results  Component Value Date   GLUCAP 83 05/23/2016   HGBA1C 9.9* 01/28/2016    Review of Glycemic Control  Needs insulin adjustment. Pt on Levemir instead of Lantus at home.  Inpatient Diabetes Program Recommendations:    Change Lantus to Levemir 5 units bid. (On Levemir at home) Decrease Novolog to sensitive tidwc and hs.  Will continue to follow. Thank you. Ailene Ards, RD, LDN, CDE Inpatient Diabetes Coordinator [phone]

## 2016-05-23 NOTE — Progress Notes (Addendum)
Notified by Rulon Eisenmenger, Phlebotomy Lab, that pt refused this AM lab draws.  Physician notified.

## 2016-05-23 NOTE — Progress Notes (Signed)
PROGRESS NOTE    Madison [name]  YSA:[id number] DOB: [birth year] DOA: 05/17/2016 PCP: [name], NP    Subjective: Sitting at bedside, denies any new complaints.  Brief Narrative:Madison [name] is a 55 y.o. female with diabetes mellitus type 1, mechanical aortic valve, CAD status post CABG who was discharged 2 days ago was found to be hypotensive by patient's home health aide. Patient was referred to the ER. The ER patient's blood pressure was in the 80 systolic with heart rate around 130 beats per minute. Patient's lab work show markedly elevated blood sugar with bicarbonate less than 7 lactate more than 6.. Patient was started on IV fluid boluses for DKA and possible sepsis along with IV insulin infusion for DKA. Chest x-ray shows infiltrates concerning for pneumonia. She was felt to have Hypovolemic shock and improved with fluid resuscitation & DKA Tx in ICU  transferred to [facility] today 7/1  Assessment & Plan: 1. Shock -due to hypovolemia, fluid loss from DKA -improved with fluid resuscitation and brief use of pressors in ICU -clinically do not suspect Sepsis -stopped Vanc and cefepime, Repeat CXR negative -Blood Cx negative -This is resolved.  2. DKA -treated with insulin gtt, IVF -very brittle DM, CBGs improved -continue lantus, add 2units meal coverage  3. H/o mechanical aortic valve -was not getting warfarin for some reason and remained on Hep gtt in ICU -restarted Warfarin, bridge with Heparin till INR >2.5  4. Anxiety/Depression -continue celexa  5. CAD status post CABG - denies any chest pain  6. Cognitive issues -needs more assistance, has home health aide -She will be discharged to SNF, per CSW she does have only 4 days left in her Medicare SNF stay. -Per [facility], she will be followed by the [facility].  Full Code Communication: none at bedside, no close family Dispo: To skilled nursing facility today, INR is 2.7.  Consultants:   PCCM  Antimicrobials: Vanc/Cefepime   Objective: Filed Vitals:   05/22/16 0513 05/22/16 1129 05/22/16 2057 05/23/16 0636  BP: 129/66 143/60 132/53 143/67  Pulse: 75 71 82 79  Temp: 98.2 F (36.8 C) 98.7 F (37.1 C) 99.5 F (37.5 C) 98.5 F (36.9 C)  TempSrc: Oral Oral Oral Oral  Resp: 20 16 20 20   Height:      Weight: 57.561 kg (126 lb 14.4 oz)   55.293 kg (121 lb 14.4 oz)  SpO2: 100% 100% 97% 98%    Intake/Output Summary (Last 24 hours) at 05/23/16 1104 Last data filed at 05/23/16 1012  Gross per 24 hour  Intake    895 ml  Output   1176 ml  Net   -281 ml   Filed Weights   05/21/16 0624 05/22/16 0513 05/23/16 0636  Weight: 58.786 kg (129 lb 9.6 oz) 57.561 kg (126 lb 14.4 oz) 55.293 kg (121 lb 14.4 oz)    Examination:  General exam: Appears calm and comfortable, AAOx 2-cognitive delay Respiratory system: Clear to auscultation. Respiratory effort normal. Cardiovascular system: S1 & S2 heard, RRR. No JVD, murmurs, rubs, gallops or clicks. No pedal edema. Gastrointestinal system: Abdomen is nondistended, soft and nontender. No organomegaly or masses felt. Normal bowel sounds heard. Central nervous system: Alert and oriented. No focal neurological deficits. Extremities: Symmetric 5 x 5 power. Skin: No rashes, lesions or ulcers Psychiatry: Judgement and insight appear normal. Mood & affect appropriate.     Data Reviewed: I have personally reviewed following labs and imaging studies  CBC:  Recent Labs Lab  05/17/16 0340 05/17/16 0343 05/18/16 0539 05/19/16 1130 05/20/16 0555  WBC 18.0*  --  8.4 4.7 3.6*  NEUTROABS 14.9*  --   --  2.2  --   HGB 9.3* 10.9* 8.5* 9.2* 9.8*  HCT 30.4* 32.0* 26.8* 28.5* 30.9*  MCV 93.5  --  88.7 87.7 90.9  PLT 369  --  311 257 204   Basic Metabolic Panel:  Recent Labs Lab 05/17/16 1933 05/17/16 1935 05/18/16 0539 05/18/16 0728 05/19/16 1130 05/20/16 0555 05/21/16 0231 05/22/16 0853  NA 131*  --   --  136 137 138 138  138  K 4.2  --   --  3.5 4.7 4.1 3.9 3.8  CL 109  --   --  109 109 108 108 106  CO2 13*  --   --  19* 22 24 25 26   GLUCOSE 306*  --   --  69 116* 217* 146* 94  BUN 5*  --   --  <5* <5* <5* <5* <5*  CREATININE 0.84  --   --  0.75 0.57 0.60 0.62 0.63  CALCIUM 7.6*  --   --  7.9* 8.3* 8.5* 8.7* 8.8*  MG 1.5*  --  2.3  --  1.8  --   --   --   PHOS  --  1.9* 2.7  --  2.3*  --   --   --    GFR: Estimated Creatinine Clearance: 65.7 mL/min (by C-G formula based on Cr of 0.63). Liver Function Tests:  Recent Labs Lab 05/17/16 0340 05/19/16 1130  AST 21  --   ALT 16  --   ALKPHOS 95  --   BILITOT 1.6*  --   PROT 5.6*  --   ALBUMIN 2.7* 2.0*   No results for input(s): LIPASE, AMYLASE in the last 168 hours. No results for input(s): AMMONIA in the last 168 hours. Coagulation Profile:  Recent Labs Lab 05/18/16 0539 05/19/16 0651 05/20/16 0555 05/21/16 0231 05/22/16 0853  INR 1.20 1.16 1.19 1.65* 2.71*   Cardiac Enzymes:  Recent Labs Lab 05/17/16 0627  TROPONINI <0.03   BNP (last 3 results) No results for input(s): PROBNP in the last 8760 hours. HbA1C: No results for input(s): HGBA1C in the last 72 hours. CBG:  Recent Labs Lab 05/22/16 0605 05/22/16 1127 05/22/16 1640 05/22/16 2138 05/23/16 0635  GLUCAP 173* 126* 148* 235* 376*   Lipid Profile: No results for input(s): CHOL, HDL, LDLCALC, TRIG, CHOLHDL, LDLDIRECT in the last 72 hours. Thyroid Function Tests: No results for input(s): TSH, T4TOTAL, FREET4, T3FREE, THYROIDAB in the last 72 hours. Anemia Panel: No results for input(s): VITAMINB12, FOLATE, FERRITIN, TIBC, IRON, RETICCTPCT in the last 72 hours. Urine analysis:    Component Value Date/Time   COLORURINE YELLOW 05/17/2016 0353   APPEARANCEUR CLEAR 05/17/2016 0353   LABSPEC 1.025 05/17/2016 0353   PHURINE 5.0 05/17/2016 0353   GLUCOSEU >1000* 05/17/2016 0353   HGBUR NEGATIVE 05/17/2016 0353   BILIRUBINUR NEGATIVE 05/17/2016 0353   BILIRUBINUR n  06/16/2015 1244   KETONESUR >80* 05/17/2016 0353   PROTEINUR NEGATIVE 05/17/2016 0353   PROTEINUR n 06/16/2015 1244   UROBILINOGEN 0.2 07/30/2015 1415   UROBILINOGEN 0.2 06/16/2015 1244   NITRITE NEGATIVE 05/17/2016 0353   NITRITE n 06/16/2015 1244   LEUKOCYTESUR NEGATIVE 05/17/2016 0353   Sepsis Labs: @LABRCNTIP (procalcitonin:4,lacticidven:4)  ) Recent Results (from the past 240 hour(s))  Blood Culture (routine x 2)     Status: None   Collection Time: 05/17/16  3:30 AM  Result Value Ref Range Status   Specimen Description BLOOD RIGHT ARM  Final   Special Requests BOTTLES DRAWN AEROBIC AND ANAEROBIC  Final   Culture NO GROWTH 5 DAYS  Final   Report Status 05/22/2016 FINAL  Final  Blood Culture (routine x 2)     Status: None   Collection Time: 05/17/16  3:40 AM  Result Value Ref Range Status   Specimen Description BLOOD LEFT WRIST  Final   Special Requests IN PEDIATRIC BOTTLE  Final   Culture NO GROWTH 5 DAYS  Final   Report Status 05/22/2016 FINAL  Final  Urine culture     Status: None   Collection Time: 05/17/16  3:53 AM  Result Value Ref Range Status   Specimen Description URINE, CATHETERIZED  Final   Special Requests NONE  Final   Culture NO GROWTH  Final   Report Status 05/18/2016 FINAL  Final         Radiology Studies: No results found.      Scheduled Meds: . citalopram  40 mg Oral Daily  . famotidine  40 mg Oral QHS  . furosemide  20 mg Oral Daily  . insulin aspart  0-15 Units Subcutaneous TID WC  . insulin aspart  0-5 Units Subcutaneous QHS  . insulin aspart  2 Units Subcutaneous TID WC  . insulin glargine  5 Units Subcutaneous Daily  . pantoprazole  40 mg Oral Q1200  . pravastatin  20 mg Oral Daily  . Warfarin - Pharmacist Dosing Inpatient   Does not apply q1800   Continuous Infusions:     LOS: 6 days    Time spent:    Zannie Cove, MD [facility] Pager [phone]  If 7PM-7AM, please contact  night-coverage [email or web address] Password [email or web address] 05/23/2016, 11:04 AM

## 2016-05-23 NOTE — Consult Note (Signed)
   [facility] CM Inpatient Consult   05/23/2016  QUENISHA LOVINS Apr 14, 1961 [id number]   [facility] Care Management follow up. Ms. Arnett remains in house. Call made to [facility], Admissions Coordinator to make her aware as patient's home appointment was scheduled for 10 am today. Call was made to [facility] prior to appointment time. Also noted that patient is supposed to go to SNF. Spoke with inpatient Licensed CSW to confirm discharge plans. Ms. Macapagal continues to be readmitted x 11 in the past 6 months despite best efforts of home health agencies and [facility] Care Management. SNF placement is the best option for her. Made Alisa with [facility] aware that writer will contact her once Ms. Luckow is discharged so she can have an idea of when to reschedule Ms. Sowash for an appointment. Will continue to follow and update [facility] community team once plans are finalized.   Raiford Noble, MSN-Ed, RN,BSN [facility] Liaison [phone]

## 2016-05-23 NOTE — Clinical Social Work Note (Addendum)
Patient's insurance authorization was sent for MD review. May not have until tomorrow. SNF notified. Patient aware.  Charlynn Court, CSW [phone]

## 2016-05-23 NOTE — Clinical Social Work Note (Addendum)
Per report from covering CSW yesterday, [name] can take patient but she only has 4 Medicare days left, meaning that she will have to pay $164.50 per day. Patient must stay 30 days for Medicaid to pay copay and last time she stayed at the SNF, she did not stay for 30 days. Will discuss with patient today.  Dayton Scrape, [place] [phone]  10:21 am CSW met with patient this morning to discuss insurance barriers. Patient stated that she was unable to pay privately fr care. MD aware of [employer] only being able to pay for 4 days and still wants patient to go. CSW contacted Deirdre Pippins, admissions coordinator at [facility] and they are still willing to take patient. Patient has met out of pocket costs so she will not owe any co-pays as long as [employer] approves authorization. Patient aware and is agreeable. CSW called Summer Weiss with [employer] [facility] to start auth. Ms. Barnet Pall spoke with patient's sister and she will go to SNF around 12:00 to do admissions paperwork.  Dayton Scrape, [place]

## 2016-05-23 NOTE — Progress Notes (Addendum)
ANTICOAGULATION CONSULT NOTE - Follow Up Consult  Pharmacy Consult for Warfarin Indication: mechanical aortic valve  Allergies  Allergen Reactions  . Adhesive [Tape] Other (See Comments)    Can burn skin if left on too long  . Celebrex [Celecoxib] Rash  . Detrol [Tolterodine] Hives    Patient Measurements: Height: '5\' 3"'  (160 cm) Weight: 121 lb 14.4 oz (55.293 kg) (scale b) IBW/kg (Calculated) : 52.4  Vital Signs: Temp: 98.6 F (37 C) (07/05 1120) Temp Source: Oral (07/05 1120) BP: 116/54 mmHg (07/05 1120) Pulse Rate: 80 (07/05 1120)  Labs:  Recent Labs  05/21/16 0231 05/21/16 1011 05/21/16 1859 05/22/16 0853  LABPROT 19.5*  --   --  28.4*  INR 1.65*  --   --  2.71*  HEPARINUNFRC 0.20* 0.58 0.56 0.76*  CREATININE 0.62  --   --  0.63    Estimated Creatinine Clearance: 65.7 mL/min (by C-G formula based on Cr of 0.63).   Medications:  Prescriptions prior to admission  Medication Sig Dispense Refill Last Dose  . citalopram (CELEXA) 40 MG tablet Take 1 tablet (40 mg total) by mouth daily. 90 tablet 3 05/15/2016  . HYDROcodone-acetaminophen (NORCO/VICODIN) 5-325 MG tablet Take 1 tablet by mouth every 6 (six) hours as needed for moderate pain. Reported on 05/16/2016   3 days ago  . insulin aspart (NOVOLOG FLEXPEN) 100 UNIT/ML FlexPen Inject 0-9 Units into the skin 3 (three) times daily with meals. CBG < 70: eat or drink something and recheck, CBG 70 - 120: 0 units CBG 121 - 150: 2 unit CBG 151 - 200: 3 units CBG 201 - 250: 5 units CBG 251 - 300:  6units CBG 301 - 350:  7units CBG 351 - 400: 10 units CBG > 400: call MD. (Patient taking differently: Inject 5 Units into the skin 3 (three) times daily with meals. ) 15 mL 0 05/15/2016 at lunch  . omeprazole (PRILOSEC) 40 MG capsule TAKE ONE CAPSULE BY MOUTH EVERY DAY 90 capsule 1 2 days ago  . ondansetron (ZOFRAN) 4 MG tablet Take 4 mg by mouth every 4 (four) hours as needed for nausea or vomiting. Reported on 05/16/2016    05/14/2016  . pravastatin (PRAVACHOL) 20 MG tablet Take 20 mg by mouth daily.   05/15/2016  . traZODone (DESYREL) 50 MG tablet Take 50 mg by mouth at bedtime. Reported on 05/16/2016  0 05/14/2016  . [DISCONTINUED] insulin detemir (LEVEMIR) 100 UNIT/ML injection Inject 0.1 mLs (10 Units total) into the skin daily. 10 mL 1 05/15/2016  . [DISCONTINUED] warfarin (COUMADIN) 7.5 MG tablet Take 1 tablet (7.5 mg total) by mouth daily at 6 PM. (Patient taking differently: Take 7.5 mg by mouth at bedtime. ) 30 tablet 1 05/15/2016  . Blood Glucose Monitoring Suppl ([place]) w/Device KIT Use to check glucose 2x per day 1 each 0 Taking  . glucose blood test strip Use as instructed (Patient taking differently: 1 each by Other route as needed (as instructed). Use as instructed) 100 each 12 Taking    Assessment: 55 yo F admitted 05/17/2016 on warfarin PTA for mechanical aortic valve.     Goal of Therapy:  INR 2.5-3.5 Monitor platelets by anticoagulation protocol: Yes   Plan:  Warfarin 7. 5 mg PO x 1 tonight  Monitor daily INR, CBC and s/sx of bleeding  Thank you Anette Guarneri, PharmD [phone]  05/23/2016,5:34 PM

## 2016-05-24 ENCOUNTER — Other Ambulatory Visit: Payer: Self-pay

## 2016-05-24 DIAGNOSIS — E1310 Other specified diabetes mellitus with ketoacidosis without coma: Secondary | ICD-10-CM | POA: Diagnosis not present

## 2016-05-24 DIAGNOSIS — E441 Mild protein-calorie malnutrition: Secondary | ICD-10-CM | POA: Diagnosis not present

## 2016-05-24 DIAGNOSIS — Z952 Presence of prosthetic heart valve: Secondary | ICD-10-CM | POA: Diagnosis not present

## 2016-05-24 DIAGNOSIS — I10 Essential (primary) hypertension: Secondary | ICD-10-CM | POA: Diagnosis not present

## 2016-05-24 DIAGNOSIS — R7309 Other abnormal glucose: Secondary | ICD-10-CM | POA: Diagnosis not present

## 2016-05-24 DIAGNOSIS — E7800 Pure hypercholesterolemia, unspecified: Secondary | ICD-10-CM | POA: Diagnosis present

## 2016-05-24 DIAGNOSIS — R739 Hyperglycemia, unspecified: Secondary | ICD-10-CM | POA: Diagnosis not present

## 2016-05-24 DIAGNOSIS — E871 Hypo-osmolality and hyponatremia: Secondary | ICD-10-CM | POA: Diagnosis not present

## 2016-05-24 DIAGNOSIS — Z7901 Long term (current) use of anticoagulants: Secondary | ICD-10-CM | POA: Diagnosis not present

## 2016-05-24 DIAGNOSIS — I2510 Atherosclerotic heart disease of native coronary artery without angina pectoris: Secondary | ICD-10-CM | POA: Diagnosis not present

## 2016-05-24 DIAGNOSIS — R6510 Systemic inflammatory response syndrome (SIRS) of non-infectious origin without acute organ dysfunction: Secondary | ICD-10-CM | POA: Diagnosis not present

## 2016-05-24 DIAGNOSIS — G9349 Other encephalopathy: Secondary | ICD-10-CM | POA: Diagnosis not present

## 2016-05-24 DIAGNOSIS — E1069 Type 1 diabetes mellitus with other specified complication: Secondary | ICD-10-CM | POA: Diagnosis not present

## 2016-05-24 DIAGNOSIS — E784 Other hyperlipidemia: Secondary | ICD-10-CM | POA: Diagnosis not present

## 2016-05-24 DIAGNOSIS — Z5181 Encounter for therapeutic drug level monitoring: Secondary | ICD-10-CM | POA: Diagnosis not present

## 2016-05-24 DIAGNOSIS — Z8672 Personal history of thrombophlebitis: Secondary | ICD-10-CM | POA: Diagnosis not present

## 2016-05-24 DIAGNOSIS — I110 Hypertensive heart disease with heart failure: Secondary | ICD-10-CM | POA: Diagnosis not present

## 2016-05-24 DIAGNOSIS — I5022 Chronic systolic (congestive) heart failure: Secondary | ICD-10-CM | POA: Diagnosis not present

## 2016-05-24 DIAGNOSIS — R2689 Other abnormalities of gait and mobility: Secondary | ICD-10-CM | POA: Diagnosis not present

## 2016-05-24 DIAGNOSIS — D6489 Other specified anemias: Secondary | ICD-10-CM | POA: Diagnosis present

## 2016-05-24 DIAGNOSIS — F329 Major depressive disorder, single episode, unspecified: Secondary | ICD-10-CM | POA: Diagnosis present

## 2016-05-24 DIAGNOSIS — E875 Hyperkalemia: Secondary | ICD-10-CM | POA: Diagnosis not present

## 2016-05-24 DIAGNOSIS — Z8249 Family history of ischemic heart disease and other diseases of the circulatory system: Secondary | ICD-10-CM | POA: Diagnosis not present

## 2016-05-24 DIAGNOSIS — R404 Transient alteration of awareness: Secondary | ICD-10-CM | POA: Diagnosis not present

## 2016-05-24 DIAGNOSIS — F419 Anxiety disorder, unspecified: Secondary | ICD-10-CM | POA: Diagnosis present

## 2016-05-24 DIAGNOSIS — Z888 Allergy status to other drugs, medicaments and biological substances status: Secondary | ICD-10-CM | POA: Diagnosis not present

## 2016-05-24 DIAGNOSIS — Z9109 Other allergy status, other than to drugs and biological substances: Secondary | ICD-10-CM | POA: Diagnosis not present

## 2016-05-24 DIAGNOSIS — E860 Dehydration: Secondary | ICD-10-CM | POA: Diagnosis present

## 2016-05-24 DIAGNOSIS — E785 Hyperlipidemia, unspecified: Secondary | ICD-10-CM | POA: Diagnosis not present

## 2016-05-24 DIAGNOSIS — K219 Gastro-esophageal reflux disease without esophagitis: Secondary | ICD-10-CM | POA: Diagnosis present

## 2016-05-24 DIAGNOSIS — R410 Disorientation, unspecified: Secondary | ICD-10-CM | POA: Diagnosis present

## 2016-05-24 DIAGNOSIS — R1111 Vomiting without nausea: Secondary | ICD-10-CM | POA: Diagnosis not present

## 2016-05-24 DIAGNOSIS — F0390 Unspecified dementia without behavioral disturbance: Secondary | ICD-10-CM | POA: Diagnosis not present

## 2016-05-24 DIAGNOSIS — Z8673 Personal history of transient ischemic attack (TIA), and cerebral infarction without residual deficits: Secondary | ICD-10-CM | POA: Diagnosis not present

## 2016-05-24 DIAGNOSIS — I5032 Chronic diastolic (congestive) heart failure: Secondary | ICD-10-CM | POA: Diagnosis not present

## 2016-05-24 DIAGNOSIS — E1010 Type 1 diabetes mellitus with ketoacidosis without coma: Secondary | ICD-10-CM | POA: Diagnosis not present

## 2016-05-24 DIAGNOSIS — M6281 Muscle weakness (generalized): Secondary | ICD-10-CM | POA: Diagnosis not present

## 2016-05-24 DIAGNOSIS — I5042 Chronic combined systolic (congestive) and diastolic (congestive) heart failure: Secondary | ICD-10-CM | POA: Diagnosis not present

## 2016-05-24 DIAGNOSIS — F331 Major depressive disorder, recurrent, moderate: Secondary | ICD-10-CM | POA: Diagnosis not present

## 2016-05-24 DIAGNOSIS — N179 Acute kidney failure, unspecified: Secondary | ICD-10-CM | POA: Diagnosis not present

## 2016-05-24 DIAGNOSIS — Z794 Long term (current) use of insulin: Secondary | ICD-10-CM | POA: Diagnosis not present

## 2016-05-24 DIAGNOSIS — I959 Hypotension, unspecified: Secondary | ICD-10-CM | POA: Diagnosis not present

## 2016-05-24 DIAGNOSIS — I252 Old myocardial infarction: Secondary | ICD-10-CM | POA: Diagnosis not present

## 2016-05-24 DIAGNOSIS — D72829 Elevated white blood cell count, unspecified: Secondary | ICD-10-CM | POA: Diagnosis present

## 2016-05-24 DIAGNOSIS — Z823 Family history of stroke: Secondary | ICD-10-CM | POA: Diagnosis not present

## 2016-05-24 DIAGNOSIS — D7589 Other specified diseases of blood and blood-forming organs: Secondary | ICD-10-CM | POA: Diagnosis present

## 2016-05-24 DIAGNOSIS — Z79899 Other long term (current) drug therapy: Secondary | ICD-10-CM | POA: Diagnosis not present

## 2016-05-24 DIAGNOSIS — J9601 Acute respiratory failure with hypoxia: Secondary | ICD-10-CM | POA: Diagnosis not present

## 2016-05-24 DIAGNOSIS — G9340 Encephalopathy, unspecified: Secondary | ICD-10-CM | POA: Diagnosis not present

## 2016-05-24 DIAGNOSIS — R278 Other lack of coordination: Secondary | ICD-10-CM | POA: Diagnosis not present

## 2016-05-24 DIAGNOSIS — J690 Pneumonitis due to inhalation of food and vomit: Secondary | ICD-10-CM | POA: Diagnosis not present

## 2016-05-24 LAB — BASIC METABOLIC PANEL
Anion gap: 9 (ref 5–15)
CO2: 28 mmol/L (ref 22–32)
CREATININE: 0.67 mg/dL (ref 0.44–1.00)
Calcium: 9.1 mg/dL (ref 8.9–10.3)
Chloride: 98 mmol/L — ABNORMAL LOW (ref 101–111)
GFR calc Af Amer: >60 mL/min (ref >60)
GLUCOSE: 249 mg/dL — AB (ref 65–99)
POTASSIUM: 4.2 mmol/L (ref 3.5–5.1)
Sodium: 135 mmol/L (ref 135–145)

## 2016-05-24 LAB — PROTIME-INR
INR: 2.34 — ABNORMAL HIGH (ref 0.00–1.49)
Prothrombin Time: 25.4 seconds — ABNORMAL HIGH (ref 11.6–15.2)

## 2016-05-24 LAB — GLUCOSE, CAPILLARY
Glucose-Capillary: 183 mg/dL — ABNORMAL HIGH (ref 65–99)
Glucose-Capillary: 232 mg/dL — ABNORMAL HIGH (ref 65–99)
Glucose-Capillary: 269 mg/dL — ABNORMAL HIGH (ref 65–99)

## 2016-05-24 NOTE — Patient Outreach (Signed)
Patient remains in the acute care setting. Several calls made between [facility], [facility] Case Management and Inpatient Acute Care Management to collaborate to get patient discharged to appropriate level of care.   Plan: Await decision by medical director at [facility] for decision on request by patient to be discharged to SNF.

## 2016-05-24 NOTE — Progress Notes (Signed)
Occupational Therapy Treatment Patient Details Name: Madison [name] MRN: [id number] DOB: 05-Nov-1961 Today's Date: 05/24/2016    History of present illness This is a 55 yo female with frequent admissions secondary to DKA with PMH of Type I DM, Mechanic aortic valve replacement (takes coumadin), CHF, CAD, Obesity, Hypercholesteremia, HTN, Dyslipidemia, Stroke syndrome, GERD, Anxiety, Depression, Rheumatoid arthritis, Aortic Stenosis, Thrombophlebitis, Heart Murmur, MI, and Pneumonia. She presented to [facility] ER on 05/17/2016 with c/o hyperglycemia, onset 2 days ago blood sugars ranging between 400 to 500's. dizzy and fall at home day before admission.    OT comments  Patient making progress towards OT goals, pt continues to be supervision to min assist with ADL tasks. Continue plan of care for now and continue to recommend SNF for post acute rehab.    Follow Up Recommendations  SNF;Supervision/Assistance - 24 hour    Equipment Recommendations  None recommended by OT    Recommendations for Other Services  None at this time   Precautions / Restrictions Precautions Precautions: Fall Restrictions Weight Bearing Restrictions: No    Mobility Bed Mobility General bed mobility comments: Pt found seated in recliner upon OT entering/exiting room   Transfers Overall transfer level: Needs assistance Equipment used: 1 person hand held assist Transfers: Sit to/from Stand Sit to Stand: Supervision         General transfer comment: Supervision for safety. No LOB noted.     Balance Overall balance assessment: Needs assistance;History of Falls (one fall over the past 6 months, last fall due to "tripping over something") Sitting-balance support: No upper extremity supported;Feet supported Sitting balance-Leahy Scale: Good     Standing balance support: No upper extremity supported;During functional activity Standing balance-Leahy Scale: Fair    ADL Overall ADL's : Needs  assistance/impaired Eating/Feeding: Set up;Sitting   Grooming: Set up;Supervision/safety;Standing   Upper Body Bathing: Set up;Supervision/ safety;Standing   Lower Body Bathing: Minimal assistance;Sit to/from stand   Upper Body Dressing : Standing;Minimal assistance   Lower Body Dressing: Minimal assistance;Sit to/from stand   Toilet Transfer: Supervision/safety;Set up;Ambulation;Comfort height toilet;Grab bars   Toileting- Clothing Manipulation and Hygiene: Supervision/safety;Sit to/from stand         General ADL Comments: Pt ambulated from recliner to BR for toilet transfer. Pt then stood at sink for ADL (UB/LB bathing and grooming tasks). Pt did require assistance with management of gown during ADL and required supervision for her safety. No incontienence.       Vision Additional Comments: Pt continues to have difficulty locating items on sink. Pt reports that she will have her glasses when she goes to SNF. Vision to be further assessed.           Cognition   Behavior During Therapy: WFL for tasks assessed/performed Overall Cognitive Status: Within Functional Limits for tasks assessed (for simple mobility/ADL tasks.)                 Pertinent Vitals/ Pain       Pain Assessment: No/denies pain   Frequency Min 2X/week     Progress Toward Goals  OT Goals(current goals can now befound in the care plan section)  Progress towards OT goals: Progressing toward goals  Acute Rehab OT Goals Patient Stated Goal: Now agreeable to SNF OT Goal Formulation: With patient Time For Goal Achievement: 05/28/16 Potential to Achieve Goals: Good  Plan Discharge plan remains appropriate    End of Session   Activity Tolerance Patient tolerated treatment well   Patient Left in chair;with call  bell/phone within reach;with chair alarm set  Nurse Communication Mobility status;Other (comment) (Urine output )     Time: 7035-0093 OT Time Calculation (min): 13 min  Charges: OT  General Charges $OT Visit: 1 Procedure OT Treatments $Self Care/Home Management : 8-22 mins  Edwin Cap , MS, OTR/L, CLT Pager: [phone]  05/24/2016, 9:17 AM

## 2016-05-24 NOTE — Care Management Important Message (Signed)
Important Message  Patient Details  Name: Madison [name] MRN: [id number] Date of Birth: 1961/08/19   Medicare Important Message Given:  Yes    Nivia Gervase, Stephan Minister 05/24/2016, 8:59 AM

## 2016-05-24 NOTE — Patient Outreach (Signed)
Patient remains hospitalization in the acute care setting, however, disposition is set for skilled nursing facility placement.     Plan: Will continue to follow during her at discharge to skilled nursing facility for community care coordination needs.

## 2016-05-24 NOTE — Progress Notes (Signed)
Orders received for pt discharge.  IV removed and site remains clean, dry, intact.  Called [facility] at [phone] and gave report to [facility], with no further questions at this time. Telemetry removed.  Pt in stable condition and awaiting transport.

## 2016-05-24 NOTE — Consult Note (Signed)
   [facility] CM Inpatient Consult   05/24/2016  Madison [name] [birth year] [id number]   Made aware that [name] will be going to [facility] at discharge today. Will make [facility] intake coordinator aware of patient's disposition and will also alert [facility] Community team.  Raiford Noble, MSN-Ed, RN,BSN [facility] Liaison [phone]

## 2016-05-24 NOTE — Clinical Social Work Note (Signed)
Insurance authorization obtained: [id number]. Patient can discharge to [name] when ready.  Charlynn Court, CSW [phone]

## 2016-05-24 NOTE — Clinical Social Work Placement (Signed)
   CLINICAL SOCIAL WORK PLACEMENT  NOTE  Date:  05/24/2016  Patient Details  Name: Madison [name] MRN: [id number] Date of Birth: 1961-11-11  Clinical Social Work is seeking post-discharge placement for this patient at the Skilled  Nursing Facility level of care (*CSW will initial, date and re-position this form in  chart as items are completed):  Yes   Patient/family provided with [place] Clinical Social Work Department's list of facilities offering this level of care within the geographic area requested by the patient (or if unable, by the patient's family).  Yes   Patient/family informed of their freedom to choose among providers that offer the needed level of care, that participate in Medicare, Medicaid or managed care program needed by the patient, have an available bed and are willing to accept the patient.  Yes   Patient/family informed of [facility]'s ownership interest in [facility] and [facility], as well as of the fact that they are under no obligation to receive care at these facilities.  PASRR submitted to EDS on 05/21/16     PASRR number received on       Existing PASRR number confirmed on 05/21/16     FL2 transmitted to all facilities in geographic area requested by pt/family on 05/22/16     FL2 transmitted to all facilities within larger geographic area on       Patient informed that his/her managed care company has contracts with or will negotiate with certain facilities, including the following:        Yes   Patient/family informed of bed offers received.  Patient chooses bed at [facility]     Physician recommends and patient chooses bed at      Patient to be transferred to [facility] on 05/24/16.  Patient to be transferred to facility by [facility]     Patient family notified on 05/24/16 of transfer.  Name of family member notified:  [name] (Will call once we have discharge order)     PHYSICIAN Please prepare  prescriptions     Additional Comment:    _______________________________________________ Margarito Liner, LCSW 05/24/2016, 10:39 AM

## 2016-05-24 NOTE — Clinical Social Work Note (Signed)
CSW facilitated patient discharge including contacting facility to confirm patient discharge plans. CSW offered to call patient's sister but she declined. Clinical information faxed to facility and family agreeable with plan. CSW arranged ambulance transport via [facility] to [name]. RN to call report prior to discharge [phone]).  CSW will sign off for now as social work intervention is no longer needed. Please consult [place] again if new needs arise.  Charlynn Court, CSW [phone]

## 2016-05-24 NOTE — Discharge Summary (Signed)
Physician Discharge Summary  Madison [name] HDQ:[id number] DOB: 03/29/1961 DOA: 05/17/2016  PCP: Dorothyann Peng, NP  Admit date: 05/17/2016 Discharge date: 05/24/2016  Time spent: 35 minutes  Recommendations for Outpatient Follow-up:  1. PCP in 1 week, monitor, INR goal 2.5-3.5 for mechanical aortic valve 2. Please check B met In 1 week   Discharge Diagnoses:  Principal Problem:   Diabetic ketoacidosis without coma associated with type 1 diabetes mellitus ([place])   Hypovolemic shock   CAD (coronary artery disease)   DKA, type 1 (HCC)   Acute diastolic CHF   S/P AVR (aortic valve replacement)   AKI (acute kidney injury) ([place])   DKA (diabetic ketoacidoses) (HCC)   Aspiration pneumonia (HCC)   MDD (major depressive disorder), recurrent episode, moderate ([place])   Discharge Condition: stable  Diet recommendation: low sodium, diabetic  Filed Weights   05/22/16 0513 05/23/16 0636 05/24/16 0300  Weight: 57.561 kg (126 lb 14.4 oz) 55.293 kg (121 lb 14.4 oz) 53.933 kg (118 lb 14.4 oz)    History of present illness:  Madison [name] is a 55 y.o. female with diabetes mellitus type 1, mechanical aortic valve, CAD status post CABG who was discharged 2 days ago was found to be hypotensive by patient's home health aide. Patient was referred to the ER. The ER patient's blood pressure was in the 80 systolic with heart rate around 130 beats per minute. Patient's lab work show markedly elevated blood sugar with bicarbonate less than 7 lactate more than 6.. Patient was started on IV fluid boluses for DKA and possible sepsis along with IV insulin infusion for DKA  Hospital Course:  1. Shock -due to hypovolemia, fluid loss from DKA -improved with fluid resuscitation and brief use of pressors in ICU -clinically no evidence of Sepsis -was admitted to the ICU initially, when she clinically stabilized was transferred to the floor to hospitalists service -stopped Vanc and cefepime, Repeat CXR negative -Blood  Cx negative -resolved  2. DKA -treated with insulin gtt, IVF -very brittle DM, CBGs improved -continue lantus, add 2units meal coverage and sliding-scale  3. H/o mechanical aortic valve -was not getting warfarin for some reason and remained on Hep gtt in ICU -restarted Warfarin, bridge with Heparin till INR >2.5 -INR 2.7 today , goal INR is 2.5-3.5  4. Anxiety/Depression -continue celexa  5. CAD status post CABG - denies any chest pain  6. Cognitive issues -needs more assistance, has home health aide -now agreeable to SNF   Discharge Exam: Filed Vitals:   05/23/16 2059 05/24/16 0537  BP: 122/50 144/54  Pulse: 79 59  Temp: 98.9 F (37.2 C) 97.8 F (36.6 C)  Resp: 17 16    General: AAOx3 Cardiovascular: S1S2/RRR Respiratory: CTAB  Discharge Instructions   Discharge Instructions    Diet - low sodium heart healthy    Complete by:  As directed      Diet Carb Modified    Complete by:  As directed      Increase activity slowly    Complete by:  As directed           Current Discharge Medication List    START taking these medications   Details  furosemide (LASIX) 20 MG tablet Take 1 tablet (20 mg total) by mouth daily.    insulin aspart (NOVOLOG) 100 UNIT/ML injection Inject 2 Units into the skin 3 (three) times daily with meals. Qty: 10 mL, Refills: 11      CONTINUE these medications which have CHANGED  Details  insulin detemir (LEVEMIR) 100 UNIT/ML injection Inject 0.06 mLs (6 Units total) into the skin daily. Refills: 1   Associated Diagnoses: Uncontrolled type 1 diabetes mellitus with complication (HCC)    warfarin (COUMADIN) 5 MG tablet Take 1 tablet (5 mg total) by mouth daily at 6 PM. Titrate for INR 2.5-3.5      CONTINUE these medications which have NOT CHANGED   Details  citalopram (CELEXA) 40 MG tablet Take 1 tablet (40 mg total) by mouth daily. Qty: 90 tablet, Refills: 3   Associated Diagnoses: Depression    insulin aspart (NOVOLOG  FLEXPEN) 100 UNIT/ML FlexPen Inject 0-9 Units into the skin 3 (three) times daily with meals. CBG < 70: eat or drink something and recheck, CBG 70 - 120: 0 units CBG 121 - 150: 2 unit CBG 151 - 200: 3 units CBG 201 - 250: 5 units CBG 251 - 300:  6units CBG 301 - 350:  7units CBG 351 - 400: 10 units CBG > 400: call MD. Otho Darner: 15 mL, Refills: 0   Associated Diagnoses: Uncontrolled type 1 diabetes mellitus with complication (HCC)    omeprazole (PRILOSEC) 40 MG capsule TAKE ONE CAPSULE BY MOUTH EVERY DAY Qty: 90 capsule, Refills: 1    ondansetron (ZOFRAN) 4 MG tablet Take 4 mg by mouth every 4 (four) hours as needed for nausea or vomiting. Reported on 05/16/2016    pravastatin (PRAVACHOL) 20 MG tablet Take 20 mg by mouth daily.    Blood Glucose Monitoring Suppl (Pepin) w/Device KIT Use to check glucose 2x per day Qty: 1 each, Refills: 0    glucose blood test strip Use as instructed Qty: 100 each, Refills: 12      STOP taking these medications     HYDROcodone-acetaminophen (NORCO/VICODIN) 5-325 MG tablet      traZODone (DESYREL) 50 MG tablet        Allergies  Allergen Reactions  . Adhesive [Tape] Other (See Comments)    Can burn skin if left on too long  . Celebrex [Celecoxib] Rash  . Detrol [Tolterodine] Hives   Follow-up Information    Follow up with [facility] .   Specialty:  Madison information:   [address] [name] [phone]       The results of significant diagnostics from this hospitalization (including imaging, microbiology, ancillary and laboratory) are listed below for reference.    Significant Diagnostic Studies: Dg Chest 2 View  05/19/2016  CLINICAL DATA:  Followup right upper lobe infiltrate. EXAM: CHEST  2 VIEW COMPARISON:  05/17/2016 FINDINGS: Cardiac shadow is within normal limits. Postsurgical changes are again seen. The lungs are well aerated  without focal infiltrate. Mild interstitial edema is seen. Small bilateral pleural with. IMPRESSION: Mild interstitial edema and small pleural effusions. Electronically Signed   By: Inez Catalina M.D.   On: 05/19/2016 10:19   Ct Head Wo Contrast  04/30/2016  CLINICAL DATA:  Acute onset of shortness of breath. Initial encounter. EXAM: CT HEAD WITHOUT CONTRAST TECHNIQUE: Contiguous axial images were obtained from the base of the skull through the vertex without intravenous contrast. COMPARISON:  CT of the head performed 02/26/2016 FINDINGS: There is no evidence of acute infarction, mass lesion, or intra- or extra-axial hemorrhage on CT. Prominence of the ventricles and sulci reflects mild to moderate cortical volume loss. 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 Diffuse periventricular and subcortical white matter change likely reflects small vessel ischemic microangiopathy. Small chronic infarcts are noted at the cerebellar hemispheres bilaterally, more prominent on the left. The brainstem and fourth ventricle are within normal limits. The basal ganglia are unremarkable in appearance. No mass effect or midline shift is seen. There is no evidence of fracture; visualized osseous structures are unremarkable in appearance. The orbits are within normal limits. The paranasal sinuses and mastoid air cells are well-aerated. No significant soft tissue abnormalities are seen. IMPRESSION: 1. No acute intracranial pathology seen on CT. 2. Mild to moderate cortical volume loss and diffuse small vessel ischemic microangiopathy. 3. 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 4. Small chronic infarcts at the cerebellar hemispheres bilaterally, more prominent on the left. Electronically Signed   By:  Garald Balding M.D.   On: 04/30/2016 23:43   Dg Chest Port 1 View  05/17/2016  CLINICAL DATA:  Hyperglycemia and hypotension EXAM: PORTABLE CHEST 1 VIEW COMPARISON:  05/10/2016 FINDINGS: Mild airspace opacity in the right upper lobe may represent early infectious infiltrate or aspiration. The left lung is clear. There is no large effusion. The pulmonary vasculature is normal. IMPRESSION: Question early infectious infiltrate or aspiration in the right upper lobe. Electronically Signed   By: Andreas Newport M.D.   On: 05/17/2016 03:36   Dg Chest Port 1 View  05/10/2016  CLINICAL DATA:  Diabetic ketoacidosis. EXAM: PORTABLE CHEST 1 VIEW COMPARISON:  05/02/2016 FINDINGS: Sternotomy wires are unchanged. Interval removal of nasogastric tube and endotracheal tube. Lungs are adequately inflated and otherwise clear. Cardiomediastinal silhouette and remainder of the exam is unchanged. IMPRESSION: No active disease. Electronically Signed   By: Marin Olp M.D.   On: 05/10/2016 13:39   Dg Chest Port 1 View  05/02/2016  CLINICAL DATA:  Hypoxia EXAM: PORTABLE CHEST 1 VIEW COMPARISON:  April 30, 2016 FINDINGS: Endotracheal tube tip is in the right main bronchus. Nasogastric tube tip and side port are in the stomach. No pneumothorax. There is no edema or consolidation. Heart size and pulmonary vascular normal. No adenopathy. IMPRESSION: Endotracheal tube tip in proximal right main bronchus. Advise withdrawing endotracheal tube approximately 4 to 4.5 cm. No pneumothorax. No edema or consolidation. Critical Value/emergent results were called by telephone at the time of interpretation on 05/02/2016 at 7:01 am to Arletta Bale, RN, who verbally acknowledged these results. Electronically Signed   By: Lowella Grip III M.D.   On: 05/02/2016 07:02   Dg Chest Portable 1 View  04/30/2016  CLINICAL DATA:  Unresponsive patient. Endotracheal tube and enteric tubes were placed. Shortness of breath. EXAM: PORTABLE CHEST 1 VIEW  COMPARISON:  03/26/2016 FINDINGS: Postoperative changes in the mediastinum. An endotracheal tube has been placed with tip measuring 3 cm above the carina. An enteric tube has been placed with tip in the left upper quadrant consistent with location in the upper stomach. Normal heart size and pulmonary vascularity. No focal airspace disease or consolidation in the lungs. No blunting of costophrenic angles. No pneumothorax. Mediastinal contours appear intact. IMPRESSION: Appliances appear to be in satisfactory position. No evidence of active pulmonary disease. Electronically Signed   By: Lucienne Capers M.D.   On: 04/30/2016 23:15    Microbiology: Recent Results (from the past 240 hour(s))  Blood Culture (routine x 2)     Status: None   Collection Time: 05/17/16  3:30 AM  Result Value Ref Range Status   Specimen Description BLOOD RIGHT ARM  Final   Special Requests BOTTLES DRAWN AEROBIC AND ANAEROBIC 5ML  Final   Culture NO GROWTH 5 DAYS  Final   Report Status 05/22/2016 FINAL  Final  Blood Culture (routine x 2)     Status: None   Collection Time: 05/17/16  3:40 AM  Result Value Ref Range Status   Specimen Description BLOOD LEFT WRIST  Final   Special Requests IN PEDIATRIC BOTTLE 3ML  Final   Culture NO GROWTH 5 DAYS  Final   Report Status 05/22/2016 FINAL  Final  Urine culture     Status: None   Collection Time: 05/17/16  3:53 AM  Result Value Ref Range Status   Specimen Description URINE, CATHETERIZED  Final   Special Requests NONE  Final   Culture NO GROWTH  Final   Report Status 05/18/2016 FINAL  Final     Labs: Basic Metabolic Panel:  Recent Labs Lab 05/17/16 1933 05/17/16 1935 05/18/16 0539 05/18/16 0728 05/19/16 1130 05/20/16 0555 05/21/16 0231 05/22/16 0853  NA 131*  --   --  136 137 138 138 138  K 4.2  --   --  3.5 4.7 4.1 3.9 3.8  CL 109  --   --  109 109 108 108 106  CO2 13*  --   --  19* '22 24 25 26  ' GLUCOSE 306*  --   --  69 116* 217* 146* 94  BUN 5*  --   --   <5* <5* <5* <5* <5*  CREATININE 0.84  --   --  0.75 0.57 0.60 0.62 0.63  CALCIUM 7.6*  --   --  7.9* 8.3* 8.5* 8.7* 8.8*  MG 1.5*  --  2.3  --  1.8  --   --   --   PHOS  --  1.9* 2.7  --  2.3*  --   --   --    Liver Function Tests:  Recent Labs Lab 05/19/16 1130  ALBUMIN 2.0*   No results for input(s): LIPASE, AMYLASE in the last 168 hours. No results for input(s): AMMONIA in the last 168 hours. CBC:  Recent Labs Lab 05/18/16 0539 05/19/16 1130 05/20/16 0555  WBC 8.4 4.7 3.6*  NEUTROABS  --  2.2  --   HGB 8.5* 9.2* 9.8*  HCT 26.8* 28.5* 30.9*  MCV 88.7 87.7 90.9  PLT 311 257 204   Cardiac Enzymes: No results for input(s): CKTOTAL, CKMB, CKMBINDEX, TROPONINI in the last 168 hours. BNP: BNP (last 3 results)  Recent Labs  07/30/15 1400 03/17/16 1801 03/27/16 0832  BNP 398.4* 95.3 73.7    ProBNP (last 3 results) No results for input(s): PROBNP in the last 8760 hours.  CBG:  Recent Labs Lab 05/23/16 0635 05/23/16 1118 05/23/16 1716 05/23/16 2102 05/24/16 0542  GLUCAP 376* 83 410* 137* 183*       Signed:  Verlee Monte A MD.  [facility] 05/24/2016, 10:17 AM

## 2016-05-25 ENCOUNTER — Other Ambulatory Visit: Payer: Self-pay | Admitting: *Deleted

## 2016-05-25 ENCOUNTER — Encounter (HOSPITAL_COMMUNITY): Payer: Self-pay | Admitting: Family Medicine

## 2016-05-25 ENCOUNTER — Encounter: Payer: Self-pay | Admitting: *Deleted

## 2016-05-25 ENCOUNTER — Emergency Department (HOSPITAL_COMMUNITY): Payer: Commercial Managed Care - HMO

## 2016-05-25 ENCOUNTER — Inpatient Hospital Stay (HOSPITAL_COMMUNITY)
Admission: EM | Admit: 2016-05-25 | Discharge: 2016-05-27 | DRG: 637 | Disposition: A | Discharge status: Skilled Nursing Facility | Payer: Commercial Managed Care - HMO | Attending: Family Medicine | Admitting: Family Medicine

## 2016-05-25 DIAGNOSIS — G9349 Other encephalopathy: Secondary | ICD-10-CM | POA: Diagnosis not present

## 2016-05-25 DIAGNOSIS — E7800 Pure hypercholesterolemia, unspecified: Secondary | ICD-10-CM | POA: Diagnosis present

## 2016-05-25 DIAGNOSIS — E875 Hyperkalemia: Secondary | ICD-10-CM

## 2016-05-25 DIAGNOSIS — E441 Mild protein-calorie malnutrition: Secondary | ICD-10-CM | POA: Diagnosis not present

## 2016-05-25 DIAGNOSIS — R1111 Vomiting without nausea: Secondary | ICD-10-CM | POA: Diagnosis not present

## 2016-05-25 DIAGNOSIS — E1110 Type 2 diabetes mellitus with ketoacidosis without coma: Secondary | ICD-10-CM | POA: Diagnosis present

## 2016-05-25 DIAGNOSIS — N179 Acute kidney failure, unspecified: Secondary | ICD-10-CM | POA: Diagnosis present

## 2016-05-25 DIAGNOSIS — I2510 Atherosclerotic heart disease of native coronary artery without angina pectoris: Secondary | ICD-10-CM | POA: Diagnosis present

## 2016-05-25 DIAGNOSIS — J9601 Acute respiratory failure with hypoxia: Secondary | ICD-10-CM | POA: Diagnosis not present

## 2016-05-25 DIAGNOSIS — Z9109 Other allergy status, other than to drugs and biological substances: Secondary | ICD-10-CM | POA: Diagnosis not present

## 2016-05-25 DIAGNOSIS — F0390 Unspecified dementia without behavioral disturbance: Secondary | ICD-10-CM | POA: Diagnosis not present

## 2016-05-25 DIAGNOSIS — E785 Hyperlipidemia, unspecified: Secondary | ICD-10-CM | POA: Diagnosis not present

## 2016-05-25 DIAGNOSIS — R404 Transient alteration of awareness: Secondary | ICD-10-CM | POA: Diagnosis not present

## 2016-05-25 DIAGNOSIS — Z954 Presence of other heart-valve replacement: Secondary | ICD-10-CM

## 2016-05-25 DIAGNOSIS — Z823 Family history of stroke: Secondary | ICD-10-CM

## 2016-05-25 DIAGNOSIS — I639 Cerebral infarction, unspecified: Secondary | ICD-10-CM | POA: Diagnosis present

## 2016-05-25 DIAGNOSIS — E108 Type 1 diabetes mellitus with unspecified complications: Secondary | ICD-10-CM

## 2016-05-25 DIAGNOSIS — E162 Hypoglycemia, unspecified: Secondary | ICD-10-CM | POA: Diagnosis not present

## 2016-05-25 DIAGNOSIS — Z888 Allergy status to other drugs, medicaments and biological substances status: Secondary | ICD-10-CM

## 2016-05-25 DIAGNOSIS — F419 Anxiety disorder, unspecified: Secondary | ICD-10-CM | POA: Diagnosis present

## 2016-05-25 DIAGNOSIS — Z794 Long term (current) use of insulin: Secondary | ICD-10-CM

## 2016-05-25 DIAGNOSIS — I110 Hypertensive heart disease with heart failure: Secondary | ICD-10-CM | POA: Diagnosis present

## 2016-05-25 DIAGNOSIS — E1010 Type 1 diabetes mellitus with ketoacidosis without coma: Principal | ICD-10-CM | POA: Diagnosis present

## 2016-05-25 DIAGNOSIS — F329 Major depressive disorder, single episode, unspecified: Secondary | ICD-10-CM | POA: Diagnosis present

## 2016-05-25 DIAGNOSIS — I5032 Chronic diastolic (congestive) heart failure: Secondary | ICD-10-CM | POA: Diagnosis not present

## 2016-05-25 DIAGNOSIS — R6510 Systemic inflammatory response syndrome (SIRS) of non-infectious origin without acute organ dysfunction: Secondary | ICD-10-CM | POA: Diagnosis present

## 2016-05-25 DIAGNOSIS — Z8249 Family history of ischemic heart disease and other diseases of the circulatory system: Secondary | ICD-10-CM

## 2016-05-25 DIAGNOSIS — G9340 Encephalopathy, unspecified: Secondary | ICD-10-CM | POA: Diagnosis present

## 2016-05-25 DIAGNOSIS — D6489 Other specified anemias: Secondary | ICD-10-CM | POA: Diagnosis present

## 2016-05-25 DIAGNOSIS — Z79899 Other long term (current) drug therapy: Secondary | ICD-10-CM | POA: Diagnosis not present

## 2016-05-25 DIAGNOSIS — IMO0002 Reserved for concepts with insufficient information to code with codable children: Secondary | ICD-10-CM | POA: Diagnosis present

## 2016-05-25 DIAGNOSIS — F32A Depression, unspecified: Secondary | ICD-10-CM | POA: Diagnosis present

## 2016-05-25 DIAGNOSIS — Z8672 Personal history of thrombophlebitis: Secondary | ICD-10-CM | POA: Diagnosis not present

## 2016-05-25 DIAGNOSIS — K219 Gastro-esophageal reflux disease without esophagitis: Secondary | ICD-10-CM | POA: Diagnosis present

## 2016-05-25 DIAGNOSIS — Z5181 Encounter for therapeutic drug level monitoring: Secondary | ICD-10-CM | POA: Diagnosis not present

## 2016-05-25 DIAGNOSIS — R739 Hyperglycemia, unspecified: Secondary | ICD-10-CM

## 2016-05-25 DIAGNOSIS — M6281 Muscle weakness (generalized): Secondary | ICD-10-CM | POA: Diagnosis not present

## 2016-05-25 DIAGNOSIS — D72829 Elevated white blood cell count, unspecified: Secondary | ICD-10-CM | POA: Diagnosis present

## 2016-05-25 DIAGNOSIS — I252 Old myocardial infarction: Secondary | ICD-10-CM

## 2016-05-25 DIAGNOSIS — R410 Disorientation, unspecified: Secondary | ICD-10-CM | POA: Diagnosis present

## 2016-05-25 DIAGNOSIS — Z952 Presence of prosthetic heart valve: Secondary | ICD-10-CM

## 2016-05-25 DIAGNOSIS — I5042 Chronic combined systolic (congestive) and diastolic (congestive) heart failure: Secondary | ICD-10-CM | POA: Diagnosis not present

## 2016-05-25 DIAGNOSIS — D7589 Other specified diseases of blood and blood-forming organs: Secondary | ICD-10-CM | POA: Diagnosis present

## 2016-05-25 DIAGNOSIS — Z7901 Long term (current) use of anticoagulants: Secondary | ICD-10-CM | POA: Diagnosis not present

## 2016-05-25 DIAGNOSIS — E871 Hypo-osmolality and hyponatremia: Secondary | ICD-10-CM | POA: Diagnosis present

## 2016-05-25 DIAGNOSIS — R278 Other lack of coordination: Secondary | ICD-10-CM | POA: Diagnosis not present

## 2016-05-25 DIAGNOSIS — R2689 Other abnormalities of gait and mobility: Secondary | ICD-10-CM | POA: Diagnosis not present

## 2016-05-25 DIAGNOSIS — E1065 Type 1 diabetes mellitus with hyperglycemia: Secondary | ICD-10-CM | POA: Diagnosis present

## 2016-05-25 DIAGNOSIS — Z8673 Personal history of transient ischemic attack (TIA), and cerebral infarction without residual deficits: Secondary | ICD-10-CM | POA: Diagnosis not present

## 2016-05-25 DIAGNOSIS — I10 Essential (primary) hypertension: Secondary | ICD-10-CM | POA: Diagnosis present

## 2016-05-25 DIAGNOSIS — E860 Dehydration: Secondary | ICD-10-CM | POA: Diagnosis present

## 2016-05-25 DIAGNOSIS — R7309 Other abnormal glucose: Secondary | ICD-10-CM | POA: Diagnosis not present

## 2016-05-25 DIAGNOSIS — F331 Major depressive disorder, recurrent, moderate: Secondary | ICD-10-CM | POA: Diagnosis not present

## 2016-05-25 DIAGNOSIS — E1069 Type 1 diabetes mellitus with other specified complication: Secondary | ICD-10-CM | POA: Diagnosis not present

## 2016-05-25 DIAGNOSIS — E784 Other hyperlipidemia: Secondary | ICD-10-CM | POA: Diagnosis not present

## 2016-05-25 DIAGNOSIS — D649 Anemia, unspecified: Secondary | ICD-10-CM | POA: Diagnosis present

## 2016-05-25 LAB — COMPREHENSIVE METABOLIC PANEL
ALBUMIN: 3.9 g/dL (ref 3.5–5.0)
ALT: 18 U/L (ref 14–54)
AST: 30 U/L (ref 15–41)
Alkaline Phosphatase: 118 U/L (ref 38–126)
BILIRUBIN TOTAL: 2.1 mg/dL — AB (ref 0.3–1.2)
BUN: 28 mg/dL — ABNORMAL HIGH (ref 6–20)
CHLORIDE: 87 mmol/L — AB (ref 101–111)
CO2: <7 mmol/L — ABNORMAL LOW (ref 22–32)
Calcium: 9.2 mg/dL (ref 8.9–10.3)
Creatinine, Ser: 1.5 mg/dL — ABNORMAL HIGH (ref 0.44–1.00)
GFR calc Af Amer: 44 mL/min — ABNORMAL LOW (ref >60)
GFR calc non Af Amer: 38 mL/min — ABNORMAL LOW (ref >60)
GLUCOSE: 1042 mg/dL — AB (ref 65–99)
POTASSIUM: 6.7 mmol/L — AB (ref 3.5–5.1)
Sodium: 127 mmol/L — ABNORMAL LOW (ref 135–145)
TOTAL PROTEIN: 7 g/dL (ref 6.5–8.1)

## 2016-05-25 LAB — PROTIME-INR
INR: 1.7 — AB (ref 0.00–1.49)
PROTHROMBIN TIME: 20 s — AB (ref 11.6–15.2)

## 2016-05-25 LAB — BASIC METABOLIC PANEL
ANION GAP: 25 — AB (ref 5–15)
BUN: 25 mg/dL — ABNORMAL HIGH (ref 6–20)
CHLORIDE: 101 mmol/L (ref 101–111)
CO2: 9 mmol/L — AB (ref 22–32)
Calcium: 8.7 mg/dL — ABNORMAL LOW (ref 8.9–10.3)
Creatinine, Ser: 1.44 mg/dL — ABNORMAL HIGH (ref 0.44–1.00)
GFR calc Af Amer: 46 mL/min — ABNORMAL LOW (ref >60)
GFR calc non Af Amer: 40 mL/min — ABNORMAL LOW (ref >60)
GLUCOSE: 656 mg/dL — AB (ref 65–99)
Potassium: 4.8 mmol/L (ref 3.5–5.1)
Sodium: 135 mmol/L (ref 135–145)

## 2016-05-25 LAB — BRAIN NATRIURETIC PEPTIDE: B NATRIURETIC PEPTIDE 5: 86.2 pg/mL (ref 0.0–100.0)

## 2016-05-25 LAB — GLUCOSE, CAPILLARY
GLUCOSE-CAPILLARY: 527 mg/dL — AB (ref 65–99)
Glucose-Capillary: 414 mg/dL — ABNORMAL HIGH (ref 65–99)

## 2016-05-25 LAB — TROPONIN I

## 2016-05-25 LAB — CBG MONITORING, ED
Glucose-Capillary: >600 mg/dL (ref 65–99)
Glucose-Capillary: >600 mg/dL (ref 65–99)

## 2016-05-25 LAB — CBC WITH DIFFERENTIAL/PLATELET
BASOS ABS: 0 10*3/uL (ref 0.0–0.1)
Basophils Relative: 0 %
EOS PCT: 0 %
Eosinophils Absolute: 0 10*3/uL (ref 0.0–0.7)
HEMATOCRIT: 35.7 % — AB (ref 36.0–46.0)
HEMOGLOBIN: 10.5 g/dL — AB (ref 12.0–15.0)
LYMPHS PCT: 11 %
Lymphs Abs: 1.5 10*3/uL (ref 0.7–4.0)
MCH: 29 pg (ref 26.0–34.0)
MCHC: 29.4 g/dL — ABNORMAL LOW (ref 30.0–36.0)
MCV: 98.6 fL (ref 78.0–100.0)
MONOS PCT: 3 %
Monocytes Absolute: 0.4 10*3/uL (ref 0.1–1.0)
NEUTROS ABS: 11.4 10*3/uL — AB (ref 1.7–7.7)
Neutrophils Relative %: 86 %
Platelets: 445 10*3/uL — ABNORMAL HIGH (ref 150–400)
RBC: 3.62 MIL/uL — AB (ref 3.87–5.11)
RDW: 17.3 % — ABNORMAL HIGH (ref 11.5–15.5)
WBC: 13.3 10*3/uL — AB (ref 4.0–10.5)

## 2016-05-25 LAB — PROCALCITONIN: Procalcitonin: 2.41 ng/mL

## 2016-05-25 LAB — LIPASE, BLOOD: LIPASE: 32 U/L (ref 11–51)

## 2016-05-25 LAB — LACTIC ACID, PLASMA: LACTIC ACID, VENOUS: 6.2 mmol/L — AB (ref 0.5–1.9)

## 2016-05-25 MED ORDER — SODIUM CHLORIDE 0.9 % IV SOLN: INTRAVENOUS | Status: DC

## 2016-05-25 MED ORDER — WARFARIN SODIUM 5 MG PO TABS
10.0000 mg | ORAL_TABLET | Freq: Once | ORAL | Status: AC
  Administered 2016-05-26: 10 mg via ORAL
  Filled 2016-05-25: qty 2

## 2016-05-25 MED ORDER — SODIUM CHLORIDE 0.9 % IV BOLUS (SEPSIS)
1000.0000 mL | Freq: Once | INTRAVENOUS | Status: AC
  Administered 2016-05-25: 1000 mL via INTRAVENOUS

## 2016-05-25 MED ORDER — INSULIN REGULAR BOLUS VIA INFUSION
0.0000 [IU] | Freq: Three times a day (TID) | INTRAVENOUS | Status: DC
  Filled 2016-05-25: qty 10

## 2016-05-25 MED ORDER — WARFARIN SODIUM 5 MG PO TABS: 5.0000 mg | ORAL_TABLET | Freq: Every day | ORAL | Status: DC

## 2016-05-25 MED ORDER — PRAVASTATIN SODIUM 20 MG PO TABS
20.0000 mg | ORAL_TABLET | Freq: Every day | ORAL | Status: DC
  Administered 2016-05-26: 20 mg via ORAL
  Filled 2016-05-25: qty 1

## 2016-05-25 MED ORDER — SODIUM CHLORIDE 0.9 % IV SOLN
INTRAVENOUS | Status: DC
Start: 2016-05-25 — End: 2016-05-26
  Administered 2016-05-25: 21:00:00 via INTRAVENOUS

## 2016-05-25 MED ORDER — PANTOPRAZOLE SODIUM 40 MG PO TBEC
40.0000 mg | DELAYED_RELEASE_TABLET | Freq: Every day | ORAL | Status: DC
  Administered 2016-05-26 – 2016-05-27 (×2): 40 mg via ORAL
  Filled 2016-05-25 (×2): qty 1

## 2016-05-25 MED ORDER — SODIUM CHLORIDE 0.9 % IV BOLUS (SEPSIS): 1000.0000 mL | Freq: Once | INTRAVENOUS | Status: DC

## 2016-05-25 MED ORDER — DEXTROSE-NACL 5-0.45 % IV SOLN: INTRAVENOUS | Status: DC

## 2016-05-25 MED ORDER — WARFARIN - PHARMACIST DOSING INPATIENT: Freq: Every day | Status: DC

## 2016-05-25 MED ORDER — SODIUM CHLORIDE 0.9 % IV SOLN
INTRAVENOUS | Status: DC
  Administered 2016-05-25: 10.8 [IU]/h via INTRAVENOUS
  Administered 2016-05-25: 5.4 [IU]/h via INTRAVENOUS
  Filled 2016-05-25: qty 2.5

## 2016-05-25 MED ORDER — SODIUM CHLORIDE 0.9 % IV SOLN
INTRAVENOUS | Status: DC
  Administered 2016-05-25: 16.2 [IU]/h via INTRAVENOUS
  Filled 2016-05-25: qty 2.5

## 2016-05-25 MED ORDER — DEXTROSE-NACL 5-0.45 % IV SOLN
INTRAVENOUS | Status: DC
  Administered 2016-05-26: 02:00:00 via INTRAVENOUS

## 2016-05-25 MED ORDER — INSULIN ASPART 100 UNIT/ML ~~LOC~~ SOLN: 10.0000 [IU] | Freq: Once | SUBCUTANEOUS | Status: DC

## 2016-05-25 MED ORDER — CITALOPRAM HYDROBROMIDE 20 MG PO TABS
40.0000 mg | ORAL_TABLET | Freq: Every day | ORAL | Status: DC
  Administered 2016-05-26 – 2016-05-27 (×2): 40 mg via ORAL
  Filled 2016-05-25: qty 4
  Filled 2016-05-25: qty 2

## 2016-05-25 MED ORDER — DEXTROSE 50 % IV SOLN: 25.0000 mL | INTRAVENOUS | Status: DC | PRN

## 2016-05-25 NOTE — H&P (Signed)
History and Physical    CHRISTINA GINTZ IBB:[id number] DOB: [birth year] DOA: 05/25/2016  PCP: Dorothyann Peng, NP   Patient coming from: SNF  Chief Complaint: Uncontrolled blood sugars   HPI: CLAIR BARDWELL is a 55 y.o. female with medical history significant for  type 1 diabetes mellitus, aortic stenosis status post valve replacement, GERD, depression, chronic diastolic CHF, and chronic anemia who presents the emergency department from her SNF where blood sugars were reading "high." Patient was just discharged from the hospital yesterday after a one-week stay for management of hypovolemic shock due to DKA. She was discharged to the SNF and much improved and stable condition, but unfortunately, her blood glucose cannot be controlled there was subcutaneous insulin. Over the course the day, patient has become tachycardic, lethargic, and confused. Patient endorses some vague generalized abdominal pain but denies vomiting or diarrhea. She denies chest pain, palpitations, or cough. Patient is actually had a series of recent admissions for DKA. She was recently discharged on 6 units of Levemir daily, NovoLog 2 units 3 times daily with meals, and NovoLog per sliding scale as a correctional. SNF personnel administered 10 additional units of subcutaneous NovoLog today, but point-of-care glucometer continued to read "high."   ED Course: Upon arrival to the ED, patient is found to be afebrile, desaturating to the low 80s on room air, tachycardic in the 120s, and with stable blood pressure. Chest x-ray was negative for acute cardiopulmonary disease. CMP is notable for glucose of 1042, potassium of 6.7, bicarbonate of <7, and serum creatinine 1.50, up from 0.67 just one day prior. CBC reveals leukocytosis to 13,300, normocytic anemia with hemoglobin of 10.5, and thrombocytosis with platelet count 445,000. Patient was placed on supplemental oxygen with improvement in saturations. She was given 2 L of normal saline as a  bolus and started on insulin infusion. Tachycardia has persisted following the 2 L of normal saline and a defibrillator is being infused. Blood pressure is remained stable and the patient will be admitted to the stepdown unit for ongoing evaluation and management of type 1 diabetes with severe DKA.  Review of Systems:  Unable to obtain ROS secondary to patient's clinical condition with acute encephalopathy.  Past Medical History  Diagnosis Date  . CAD (coronary artery disease)   . Obesity   . Hypercholesteremia   . HTN (hypertension)   . Dyslipidemia   . Aortic stenosis   . Thrombophlebitis   . Heart murmur   . Myocardial infarction ([place]) 12/2014  . Pneumonia 11/2014; 12/2014  . GERD (gastroesophageal reflux disease)   . 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 Anxiety   . Depression   . Allergy   . CHF (congestive heart failure) ([place])   . Rheumatoid arthritis(714.0)     "hands" (07/01/2015)  . Diabetes ([place]) dx'd 1982    "went straight to insulin"    Past Surgical History  Procedure Laterality Date  . Cesarean section  1995  . Foot fracture surgery    . Knee arthroscopy Right   . Neuroplasty / transposition median nerve at carpal tunnel    . Left heart catheterization with coronary angiogram N/A 12/08/2014    Procedure: LEFT HEART CATHETERIZATION WITH CORONARY ANGIOGRAM;  Surgeon: Wellington Hampshire, MD;  Location: [place] CATH LAB;  Service: Cardiovascular;  Laterality: N/A;  . Percutaneous coronary stent intervention (pci-s) N/A 12/09/2014    Procedure: PERCUTANEOUS CORONARY STENT  INTERVENTION (PCI-S);  Surgeon: Leonie Man, MD;  Location: [facility] CATH LAB;  Service: Cardiovascular;  Laterality: N/A;  . Cardiac catheterization  12/08/2014  . Coronary angioplasty with stent placement  12/09/2014  . Aortic valve replacement (avr)/coronary artery bypass grafting (cabg)  "2014"    Archie Endo 03/08/2014  . Fracture surgery     . Tubal ligation  1995  . Cataract extraction Left   . Coronary artery bypass graft    . Orif ankle fracture Left 07/04/2015    Procedure: OPEN REDUCTION INTERNAL FIXATION (ORIF)  TRIMAL ANKLE FRACTURE;  Surgeon: Leandrew Koyanagi, MD;  Location: [place];  Service: Orthopedics;  Laterality: Left;     reports that she has never smoked. She has never used smokeless tobacco. She reports that she does not drink alcohol or use illicit drugs.  Allergies  Allergen Reactions  . Adhesive [Tape] Other (See Comments)    Can burn skin if left on too long  . Celebrex [Celecoxib] Rash  . Detrol [Tolterodine] Hives    Family History  Problem Relation Age of Onset  . Stroke    . Heart disease Mother   . Heart disease Sister      Prior to Admission medications   Medication Sig Start Date End Date Taking? Authorizing Provider  citalopram (CELEXA) 40 MG tablet Take 1 tablet (40 mg total) by mouth daily. 03/08/16  Yes Dorothyann Peng, NP  furosemide (LASIX) 20 MG tablet Take 1 tablet (20 mg total) by mouth daily. 05/22/16  Yes Domenic Polite, MD  glucose blood test strip Use as instructed Patient taking differently: 1 each by Other route as needed (as instructed). Use as instructed 03/16/16  Yes Dorothyann Peng, NP  insulin aspart (NOVOLOG FLEXPEN) 100 UNIT/ML FlexPen Inject 0-9 Units into the skin 3 (three) times daily with meals. CBG < 70: eat or drink something and recheck, CBG 70 - 120: 0 units CBG 121 - 150: 2 unit CBG 151 - 200: 3 units CBG 201 - 250: 5 units CBG 251 - 300:  6units CBG 301 - 350:  7units CBG 351 - 400: 10 units CBG > 400: call MD. Patient taking differently: Inject 2 Units into the skin 3 (three) times daily with meals.  04/30/16  Yes Dorothyann Peng, NP  insulin aspart (NOVOLOG) 100 UNIT/ML injection Inject 2 Units into the skin 3 (three) times daily with meals. 05/22/16  Yes Domenic Polite, MD  insulin detemir (LEVEMIR) 100 UNIT/ML injection Inject 0.06 mLs (6 Units total) into the skin  daily. 05/22/16  Yes Domenic Polite, MD  omeprazole (PRILOSEC) 40 MG capsule TAKE ONE CAPSULE BY MOUTH EVERY DAY 03/20/16  Yes Dorothyann Peng, NP  ondansetron (ZOFRAN) 4 MG tablet Take 4 mg by mouth every 4 (four) hours as needed for nausea or vomiting. Reported on 05/16/2016   Yes Historical Provider, MD  pravastatin (PRAVACHOL) 20 MG tablet Take 20 mg by mouth daily. 03/08/16  Yes Historical Provider, MD  warfarin (COUMADIN) 5 MG tablet Take 1 tablet (5 mg total) by mouth daily at 6 PM. Titrate for INR 2.5-3.5 05/22/16  Yes Domenic Polite, MD  Blood Glucose Monitoring Suppl ([place]) w/Device KIT Use to check glucose 2x per day 03/16/16   Dorothyann Peng, NP    Physical Exam: Filed Vitals:   05/25/16 1912 05/25/16 1930 05/25/16 2000  BP: 124/106 109/65 110/69  Pulse: 53 79   Resp: '26 26 27  ' SpO2: 90% 81%       Constitutional:  In respiratory distress with accessory muscle use and tachypnea  Eyes: PERTLA, lids and conjunctivae normal ENMT: Mucous membranes are dry. Posterior pharynx clear of any exudate or lesions.   Neck: normal, supple, no masses, no thyromegaly Respiratory: clear to auscultation bilaterally, no wheezing, no crackles. Increased WOB.     Cardiovascular: Rate ~120 and regular with hyperdynamic precordium. No extremity edema. No significant JVD. Abdomen: No distension, mild generalized tenderness, no masses palpated. Bowel sounds normal.  Musculoskeletal: no clubbing / cyanosis. No joint deformity upper and lower extremities. Normal muscle tone.  Skin: no significant rashes, lesions, ulcers. Warm, dry, well-perfused. Neurologic: Lethargic. CN 2-12 grossly intact. Sensation intact, DTR normal. Strength 5/5 in all 4 limbs.  Psychiatric: Confused, anxious.     Labs on Admission: I have personally reviewed following labs and imaging studies  CBC:  Recent Labs Lab 05/19/16 1130 05/20/16 0555 05/25/16 1805  WBC 4.7 3.6* 13.3*  NEUTROABS 2.2  --  11.4*  HGB  9.2* 9.8* 10.5*  HCT 28.5* 30.9* 35.7*  MCV 87.7 90.9 98.6  PLT 257 204 707*   Basic Metabolic Panel:  Recent Labs Lab 05/19/16 1130 05/20/16 0555 05/21/16 0231 05/22/16 0853 05/24/16 1043 05/25/16 1805  NA 137 138 138 138 135 127*  K 4.7 4.1 3.9 3.8 4.2 6.7*  CL 109 108 108 106 98* 87*  CO2 '22 24 25 26 28 ' <7*  GLUCOSE 116* 217* 146* 94 249* 1042*  BUN <5* <5* <5* <5* <5* 28*  CREATININE 0.57 0.60 0.62 0.63 0.67 1.50*  CALCIUM 8.3* 8.5* 8.7* 8.8* 9.1 9.2  MG 1.8  --   --   --   --   --   PHOS 2.3*  --   --   --   --   --    GFR: Estimated Creatinine Clearance: 35.1 mL/min (by C-G formula based on Cr of 1.5). Liver Function Tests:  Recent Labs Lab 05/19/16 1130 05/25/16 1805  AST  --  30  ALT  --  18  ALKPHOS  --  118  BILITOT  --  2.1*  PROT  --  7.0  ALBUMIN 2.0* 3.9    Recent Labs Lab 05/25/16 1805  LIPASE 32   No results for input(s): AMMONIA in the last 168 hours. Coagulation Profile:  Recent Labs Lab 05/19/16 0651 05/20/16 0555 05/21/16 0231 05/22/16 0853 05/24/16 1043  INR 1.16 1.19 1.65* 2.71* 2.34*   Cardiac Enzymes: No results for input(s): CKTOTAL, CKMB, CKMBINDEX, TROPONINI in the last 168 hours. BNP (last 3 results) No results for input(s): PROBNP in the last 8760 hours. HbA1C: No results for input(s): HGBA1C in the last 72 hours. CBG:  Recent Labs Lab 05/24/16 0542 05/24/16 1207 05/24/16 1752 05/25/16 1800 05/25/16 1949  GLUCAP 183* 232* 269* >600* >600*   Lipid Profile: No results for input(s): CHOL, HDL, LDLCALC, TRIG, CHOLHDL, LDLDIRECT in the last 72 hours. Thyroid Function Tests: No results for input(s): TSH, T4TOTAL, FREET4, T3FREE, THYROIDAB in the last 72 hours. Anemia Panel: No results for input(s): VITAMINB12, FOLATE, FERRITIN, TIBC, IRON, RETICCTPCT in the last 72 hours. Urine analysis:    Component Value Date/Time   COLORURINE YELLOW 05/17/2016 [place] 05/17/2016 0353   LABSPEC 1.025  05/17/2016 0353   PHURINE 5.0 05/17/2016 0353   GLUCOSEU >1000* 05/17/2016 0353   HGBUR NEGATIVE 05/17/2016 [place] 05/17/2016 0353   BILIRUBINUR n 06/16/2015 1244   KETONESUR >80* 05/17/2016 [place] NEGATIVE 05/17/2016 0353  PROTEINUR n 06/16/2015 1244   UROBILINOGEN 0.2 07/30/2015 1415   UROBILINOGEN 0.2 06/16/2015 1244   NITRITE NEGATIVE 05/17/2016 0353   NITRITE n 06/16/2015 1244   LEUKOCYTESUR NEGATIVE 05/17/2016 0353   Sepsis Labs: '@LABRCNTIP' (procalcitonin:4,lacticidven:4) ) Recent Results (from the past 240 hour(s))  Blood Culture (routine x 2)     Status: None   Collection Time: 05/17/16  3:30 AM  Result Value Ref Range Status   Specimen Description BLOOD RIGHT ARM  Final   Special Requests BOTTLES DRAWN AEROBIC AND ANAEROBIC 5ML  Final   Culture NO GROWTH 5 DAYS  Final   Report Status 05/22/2016 FINAL  Final  Blood Culture (routine x 2)     Status: None   Collection Time: 05/17/16  3:40 AM  Result Value Ref Range Status   Specimen Description BLOOD LEFT WRIST  Final   Special Requests IN PEDIATRIC BOTTLE 3ML  Final   Culture NO GROWTH 5 DAYS  Final   Report Status 05/22/2016 FINAL  Final  Urine culture     Status: None   Collection Time: 05/17/16  3:53 AM  Result Value Ref Range Status   Specimen Description URINE, CATHETERIZED  Final   Special Requests NONE  Final   Culture NO GROWTH  Final   Report Status 05/18/2016 FINAL  Final     Radiological Exams on Admission: Dg Chest Port 1 View  05/25/2016  CLINICAL DATA:  Altered mental status as well is hyperglycemia EXAM: PORTABLE CHEST 1 VIEW COMPARISON:  05/19/2016 FINDINGS: Sternotomy wires overlie normal cardiac silhouette. Normal pulmonary vasculature. No effusion, infiltrate, or pneumothorax. No acute osseous abnormality. IMPRESSION: No acute cardiopulmonary process. Electronically Signed   By: Suzy Bouchard M.D.   On: 05/25/2016 19:32    EKG: Ordered and pending.    Assessment/Plan  1. Type I DM with DKA, severe  - Glucose could not be controlled at SNF with sq insulin  - Presents with serum glucose 1040, bicarb <7, with confusion, AKI  - Uncertain etiology, had reportedly been given her insulin as prescribed at the SNF  - Obtain cultures, troponin, EKG - Given 3 L NS in ED and started on insulin infusion  - Continue insulin infusion per protocol, aggressive IVF resuscitation  - Check serial BMP q4h until renal function and lytes stabilize  - Transition to subcutaneous when parameters met   2. Hyperkalemia  - Serum potassium 6.7 on admission, likely secondary to DKA and AKI  - Anticipate improvement with insulin and aggressive IVF rehydration  - EKG ordered STAT, pending; monitor on telemetry  - Serial BMP's until stable   3. AKI  - SCr 1.50 on admission, up from 0.67 the day prior  - Suspect a prerenal azotemia in setting of DKA and intravascular volume depletion   - Anticipate improvement with fluid resuscitation  - Avoid nephrotoxins, hold Lasix  - Serial chem panels until stabilized   4. Acute encephalopathy  - Suspect this is toxic-metabolic and secondary to severe DKA  - Anticipate resolution with treatment of DKA as above   - If fails to resolve as expected, may need to extend workup   5. CAD - No anginal complaints  - STAT EKG ordered and pending  - Check troponin as unclear what sent her into DKA  - Continue Pravachol    6. Depression  - Difficult to assess on admission given acute encephalopathy  - Continue current management with Celexa    7. S/P AVR  - Maintained on warfarin  for [facility], will continue  - INR ordered, pending  - Pharmacy to assist with dosing    8. Normocytic anemia, thrombocytosis  - Hgb 10.5, higher than priors secondary to hemoconcentration; no s/s of active bleeding  - Platelet count 445k on admission, reflecting hemoconcentration and acute-phase reaction    9. SIRS  - Present with leukocytosis,  tachycardia, AKI - This is likely secondary to DKA, but there is some concern for possible infection as precipitant for DKA  - CXR without infiltrate, UA pending, cultures ordered  - Monitor off abx for now   10. Acute hypoxic respiratory failure  - Uncertain etiology, CXR clear, blood gas pending  - Desatted to 81% on rm air, normalized with 3 Lpm supplemental O2  - Continuous pulse oximetry with titration of FiO2 to maintain sats >92%    11. Chronic diastolic CHF  - Markedly dehydrated on admission in setting of severe DKA  - TTE (05/02/16) with EF 16-10%, grade 1 diastolic dysfunction, mild MR  - Hold Lasix while fluid-resuscitating  - Follow strict I/O's, daily wts  - Resume Lasix as appropriate     DVT prophylaxis: Continue warfarin  Code Status: Full  Family Communication: Discussed with family   Disposition Plan: Admit to stepdown Consults called: None Admission status: Inpatient    Vianne Bulls, MD [facility] Pager [phone]  If 7PM-7AM, please contact night-coverage [email or web address] Password [email or web address]  05/25/2016, 8:20 PM

## 2016-05-25 NOTE — Progress Notes (Signed)
ANTICOAGULATION CONSULT NOTE - Initial Consult  Pharmacy Consult for warfarin Indication: mechanical AVR  Allergies  Allergen Reactions  . Adhesive [Tape] Other (See Comments)    Can burn skin if left on too long  . Celebrex [Celecoxib] Rash  . Detrol [Tolterodine] Hives    Patient Measurements:   Heparin Dosing Weight:   Vital Signs: BP: 110/69 mmHg (07/07 2000) Pulse Rate: 79 (07/07 1930)  Labs:  Recent Labs  05/24/16 1043 05/25/16 1805  HGB  --  10.5*  HCT  --  35.7*  PLT  --  445*  LABPROT 25.4*  --   INR 2.34*  --   CREATININE 0.67 1.50*    Estimated Creatinine Clearance: 35.1 mL/min (by C-G formula based on Cr of 1.5).   Medical History: Past Medical History  Diagnosis Date  . CAD (coronary artery disease)   . Obesity   . Hypercholesteremia   . HTN (hypertension)   . Dyslipidemia   . Aortic stenosis   . Thrombophlebitis   . Heart murmur   . Myocardial infarction ([facility]) 12/2014  . Pneumonia 11/2014; 12/2014  . GERD (gastroesophageal reflux disease)   . 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 Anxiety   . Depression   . Allergy   . CHF (congestive heart failure) ([facility])   . Rheumatoid arthritis(714.0)     "hands" (07/01/2015)  . Diabetes ([facility]) dx'd 1982    "went straight to insulin"    Assessment: 49 YOF presents with altered mental status and hyperglycemia.  She was discharged from [facility] 05/24/16 with DKA.  She is on warfarin for mechanical aortic valve.    Recent home dose was 7.5mg  daily but recently prescribed 5mg  daily at discharge (on 7/4) and this is the dose reported on [facility] MAR.    Today, 05/25/2016  INR ordered.  INR from 7/6 = 2.34  CBC: Hgb stable, pltc WNL  Goal of Therapy:  INR 2.5-3.5  Plan:   Await INR to give warfarin dose tonight, IF therapeutic or near therapeutic would give warfarin 7.5mg  tonight  Daily PT/INR  Monitor for bleeding  9/6,  PharmD, BCPS.   Pager: Juliette Alcide 05/25/2016 8:40 PM

## 2016-05-25 NOTE — Patient Outreach (Signed)
[facility]) Care Management  05/25/2016  Madison [name] [birth year] [id number]   CSW was able to make brief contact with patient today to confirm that patient was discharged from [facility] on 05/24/2016 and placed at [facility], for short-term rehabilitative services.  Patient admits that she was reluctant to placement, wanting to return home to live independently.  CSW spoke with patient at length about how it is not safe for patient to live alone, and how patient has gotten to the point where she can no longer care for herself independently.  Patient is willing to admit this, as well as verbalize it, but is not willing to make any changes to improve her health or current living conditions.  CSW agreed to contact patient's sister, Madison [name], but patient declined.  Patient went on to say that Ms. [name] is "always in her business" and thinks that she is incapable of making her own decisions.  CSW will plan to meet with patient for a routine visit on July 20th at the skilled nursing facility, to assess and assist with discharge planning needs and services.  In the meantime, CSW will continue to follow patient while at [facility] to continue to encourage patient to agreed to long-term care placement arrangements.  Danford Bad, BSW, MSW, LCSW  Licensed Restaurant manager, [employer]  Mailing [address] [place], [place] [address] Physical Address-[address], [place], [place] [address] Toll Free Main # [phone] Fax # [phone] Cell # [phone]  Fax # [phone]  [name]

## 2016-05-25 NOTE — ED Provider Notes (Signed)
CSN: [id number]     Arrival date & time 05/25/16  1745 History   First MD Initiated Contact with Patient 05/25/16 1750     Chief Complaint  Patient presents with  . Hyperglycemia     (Consider location/radiation/quality/duration/timing/severity/associated sxs/prior Treatment) HPI Comments: Madison [name] is a 55 y.o. female with history of S-CHF, CAD, HLD, T1DM, S/p aortic valve replacement, and MDD presents to ED vis EMS from [facility] for hyperglycemia and AMS. Pt was d/c from hospital yesterday following a week long admission for hypovolemic shock secondary to DKA. She was d/c improved and in stable condition to SNF. I spoke with staff at [name], since her arrival they have had difficulty controlling her blood sugars. This morning her blood sugars recorded "high," she was given 13 units of insulin. Repeat blood sugar reading continued to read "high," another 10 units of insulin at 2:30pm. Staff states pt became increasingly more confused and lethargic, vomiting, and increased fluid consumption. EMS was subsequently called and she was transported to ED.   Level V caveat - AMS.   Patient is a 55 y.o. female presenting with hyperglycemia. The history is provided by the patient, the nursing home and medical records.  Hyperglycemia   Past Medical History  Diagnosis Date  . CAD (coronary artery disease)   . Obesity   . Hypercholesteremia   . HTN (hypertension)   . Dyslipidemia   . Aortic stenosis   . Thrombophlebitis   . Heart murmur   . Myocardial infarction ([place]) 12/2014  . Pneumonia 11/2014; 12/2014  . GERD (gastroesophageal reflux disease)   . Stroke syndrome [facility]) 1995; 2001    "when my son was born; problems w/speech and L hand since then" (01/19/2015)  . Anxiety   . Depression   . Allergy   . CHF (congestive heart failure) ([place])   . Rheumatoid arthritis(714.0)     "hands" (07/01/2015)  . Diabetes ([place]) dx'd 1982    "went straight to insulin"   Past Surgical History   Procedure Laterality Date  . Cesarean section  1995  . Foot fracture surgery    . Knee arthroscopy Right   . Neuroplasty / transposition median nerve at carpal tunnel    . Left heart catheterization with coronary angiogram N/A 12/08/2014    Procedure: LEFT HEART CATHETERIZATION WITH CORONARY ANGIOGRAM;  Surgeon: Wellington Hampshire, MD;  Location: [place] CATH LAB;  Service: Cardiovascular;  Laterality: N/A;  . Percutaneous coronary stent intervention (pci-s) N/A 12/09/2014    Procedure: PERCUTANEOUS CORONARY STENT INTERVENTION (PCI-S);  Surgeon: Leonie Man, MD;  Location: [facility] CATH LAB;  Service: Cardiovascular;  Laterality: N/A;  . Cardiac catheterization  12/08/2014  . Coronary angioplasty with stent placement  12/09/2014  . Aortic valve replacement (avr)/coronary artery bypass grafting (cabg)  "2014"    Archie Endo 03/08/2014  . Fracture surgery    . Tubal ligation  1995  . Cataract extraction Left   . Coronary artery bypass graft    . Orif ankle fracture Left 07/04/2015    Procedure: OPEN REDUCTION INTERNAL FIXATION (ORIF)  TRIMAL ANKLE FRACTURE;  Surgeon: Leandrew Koyanagi, MD;  Location: [place];  Service: Orthopedics;  Laterality: Left;   Family History  Problem Relation Age of Onset  . Stroke    . Heart disease Mother   . Heart disease Sister    Social History  Substance Use Topics  . Smoking status: Never Smoker   . Smokeless tobacco: Never Used  . Alcohol Use: No  OB History    No data available     Review of Systems  Unable to perform ROS: Mental status change      Allergies  Adhesive; Celebrex; and Detrol  Home Medications   Prior to Admission medications   Medication Sig Start Date End Date Taking? Authorizing Provider  citalopram (CELEXA) 40 MG tablet Take 1 tablet (40 mg total) by mouth daily. 03/08/16  Yes Dorothyann Peng, NP  furosemide (LASIX) 20 MG tablet Take 1 tablet (20 mg total) by mouth daily. 05/22/16  Yes Domenic Polite, MD  glucose blood test strip Use as  instructed Patient taking differently: 1 each by Other route as needed (as instructed). Use as instructed 03/16/16  Yes Dorothyann Peng, NP  insulin aspart (NOVOLOG FLEXPEN) 100 UNIT/ML FlexPen Inject 0-9 Units into the skin 3 (three) times daily with meals. CBG < 70: eat or drink something and recheck, CBG 70 - 120: 0 units CBG 121 - 150: 2 unit CBG 151 - 200: 3 units CBG 201 - 250: 5 units CBG 251 - 300:  6units CBG 301 - 350:  7units CBG 351 - 400: 10 units CBG > 400: call MD. Patient taking differently: Inject 2 Units into the skin 3 (three) times daily with meals.  04/30/16  Yes Dorothyann Peng, NP  insulin aspart (NOVOLOG) 100 UNIT/ML injection Inject 2 Units into the skin 3 (three) times daily with meals. 05/22/16  Yes Domenic Polite, MD  insulin detemir (LEVEMIR) 100 UNIT/ML injection Inject 0.06 mLs (6 Units total) into the skin daily. 05/22/16  Yes Domenic Polite, MD  omeprazole (PRILOSEC) 40 MG capsule TAKE ONE CAPSULE BY MOUTH EVERY DAY 03/20/16  Yes Dorothyann Peng, NP  ondansetron (ZOFRAN) 4 MG tablet Take 4 mg by mouth every 4 (four) hours as needed for nausea or vomiting. Reported on 05/16/2016   Yes Historical Provider, MD  pravastatin (PRAVACHOL) 20 MG tablet Take 20 mg by mouth daily. 03/08/16  Yes Historical Provider, MD  warfarin (COUMADIN) 5 MG tablet Take 1 tablet (5 mg total) by mouth daily at 6 PM. Titrate for INR 2.5-3.5 05/22/16  Yes Domenic Polite, MD  Blood Glucose Monitoring Suppl ([place]) w/Device KIT Use to check glucose 2x per day 03/16/16   Dorothyann Peng, NP   BP 108/36 mmHg  Pulse 95  Temp(Src) 98.5 F (36.9 C) (Oral)  Resp 18  Ht '5\' 3"'  (1.6 m)  Wt 54.6 kg  BMI 21.33 kg/m2  SpO2 98% Physical Exam  Constitutional: She appears ill. She appears distressed.  HENT:  Head: Normocephalic and atraumatic.  Mouth/Throat: Uvula is midline. Mucous membranes are dry. No trismus in the jaw. No uvula swelling. No oropharyngeal exudate, posterior oropharyngeal edema,  posterior oropharyngeal erythema or tonsillar abscesses.  Dry mucous membranes.   Eyes: Conjunctivae and EOM are normal. Pupils are equal, round, and reactive to light. Right eye exhibits no discharge. Left eye exhibits no discharge. No scleral icterus.  Neck: Normal range of motion.  Cardiovascular: Regular rhythm, normal heart sounds and intact distal pulses.  Tachycardia present.   No murmur heard. Pulmonary/Chest: Breath sounds normal. Tachypnea noted.  Tachypneic. Kussmaul breathing.   Abdominal: Soft. Bowel sounds are normal. There is no tenderness. There is no rebound and no guarding.  Musculoskeletal: Normal range of motion.  Neurological: She is alert. Coordination normal.  Skin: Skin is warm. She is diaphoretic.  Psychiatric: She has a normal mood and affect. Her behavior is normal.    ED Course  Procedures (including critical care time) Labs Review Labs Reviewed  COMPREHENSIVE METABOLIC PANEL - Abnormal; Notable for the following:    Sodium 127 (*)    Potassium 6.7 (*)    Chloride 87 (*)    CO2 <7 (*)    Glucose, Bld 1042 (*)    BUN 28 (*)    Creatinine, Ser 1.50 (*)    Total Bilirubin 2.1 (*)    GFR calc non Af Amer 38 (*)    GFR calc Af Amer 44 (*)    All other components within normal limits  CBC WITH DIFFERENTIAL/PLATELET - Abnormal; Notable for the following:    WBC 13.3 (*)    RBC 3.62 (*)    Hemoglobin 10.5 (*)    HCT 35.7 (*)    MCHC 29.4 (*)    RDW 17.3 (*)    Platelets 445 (*)    Neutro Abs 11.4 (*)    All other components within normal limits  BLOOD GAS, VENOUS - Abnormal; Notable for the following:    pH, Ven 7.379 (*)    pCO2, Ven 31.3 (*)    pO2, Ven 64.0 (*)    Bicarbonate 18.1 (*)    Acid-base deficit 5.8 (*)    All other components within normal limits  BASIC METABOLIC PANEL - Abnormal; Notable for the following:    CO2 9 (*)    Glucose, Bld 656 (*)    BUN 25 (*)    Creatinine, Ser 1.44 (*)    Calcium 8.7 (*)    GFR calc non Af Amer 40  (*)    GFR calc Af Amer 46 (*)    Anion gap 25 (*)    All other components within normal limits  BASIC METABOLIC PANEL - Abnormal; Notable for the following:    CO2 18 (*)    Glucose, Bld 243 (*)    BUN 24 (*)    Creatinine, Ser 1.05 (*)    Calcium 8.7 (*)    GFR calc non Af Amer 59 (*)    All other components within normal limits  BASIC METABOLIC PANEL - Abnormal; Notable for the following:    Glucose, Bld 115 (*)    BUN 21 (*)    Calcium 8.7 (*)    All other components within normal limits  LACTIC ACID, PLASMA - Abnormal; Notable for the following:    Lactic Acid, Venous 6.2 (*)    All other components within normal limits  LACTIC ACID, PLASMA - Abnormal; Notable for the following:    Lactic Acid, Venous 4.5 (*)    All other components within normal limits  PROTIME-INR - Abnormal; Notable for the following:    Prothrombin Time 20.0 (*)    INR 1.70 (*)    All other components within normal limits  PROTIME-INR - Abnormal; Notable for the following:    Prothrombin Time 24.6 (*)    INR 2.24 (*)    All other components within normal limits  TROPONIN I - Abnormal; Notable for the following:    Troponin I 0.04 (*)    All other components within normal limits  GLUCOSE, CAPILLARY - Abnormal; Notable for the following:    Glucose-Capillary >600 (*)    All other components within normal limits  GLUCOSE, CAPILLARY - Abnormal; Notable for the following:    Glucose-Capillary 527 (*)    All other components within normal limits  GLUCOSE, CAPILLARY - Abnormal; Notable for the following:    Glucose-Capillary 414 (*)  All other components within normal limits  GLUCOSE, CAPILLARY - Abnormal; Notable for the following:    Glucose-Capillary 142 (*)    All other components within normal limits  GLUCOSE, CAPILLARY - Abnormal; Notable for the following:    Glucose-Capillary 115 (*)    All other components within normal limits  GLUCOSE, CAPILLARY - Abnormal; Notable for the following:     Glucose-Capillary 263 (*)    All other components within normal limits  GLUCOSE, CAPILLARY - Abnormal; Notable for the following:    Glucose-Capillary 189 (*)    All other components within normal limits  GLUCOSE, CAPILLARY - Abnormal; Notable for the following:    Glucose-Capillary 105 (*)    All other components within normal limits  GLUCOSE, CAPILLARY - Abnormal; Notable for the following:    Glucose-Capillary 102 (*)    All other components within normal limits  GLUCOSE, CAPILLARY - Abnormal; Notable for the following:    Glucose-Capillary 102 (*)    All other components within normal limits  GLUCOSE, CAPILLARY - Abnormal; Notable for the following:    Glucose-Capillary 133 (*)    All other components within normal limits  CBG MONITORING, ED - Abnormal; Notable for the following:    Glucose-Capillary >600 (*)    All other components within normal limits  CBG MONITORING, ED - Abnormal; Notable for the following:    Glucose-Capillary >600 (*)    All other components within normal limits  MRSA PCR SCREENING  CULTURE, BLOOD (ROUTINE X 2)  CULTURE, BLOOD (ROUTINE X 2)  URINE CULTURE  LIPASE, BLOOD  PROCALCITONIN  BRAIN NATRIURETIC PEPTIDE  TROPONIN I  URINALYSIS, ROUTINE W REFLEX MICROSCOPIC (NOT AT [facility])  BASIC METABOLIC PANEL  TROPONIN I    Imaging Review Dg Chest Port 1 View  05/25/2016  CLINICAL DATA:  Altered mental status as well is hyperglycemia EXAM: PORTABLE CHEST 1 VIEW COMPARISON:  05/19/2016 FINDINGS: Sternotomy wires overlie normal cardiac silhouette. Normal pulmonary vasculature. No effusion, infiltrate, or pneumothorax. No acute osseous abnormality. IMPRESSION: No acute cardiopulmonary process. Electronically Signed   By: Suzy Bouchard M.D.   On: 05/25/2016 19:32   I have personally reviewed and evaluated these images and lab results as part of my medical decision-making.   EKG Interpretation None      MDM   Final diagnoses:  Type 1 diabetes mellitus  with ketoacidosis without coma ([place])    Pt is diaphoretic and appears ill. Rectal temp 99.62F. Tachycardic in 120s. Soft BPs 110s/60s. Tachypneic, kussmaul breathing. Initial CBG >600. IVFs and insulin initiated. CBC, CMP, U/A, CBG monitoring and CXR ordered. Likely will need admission. Pt discussed and also evaluated by Dr. Zenia Resides, agrees with plan.   1930: Pt desating to low 80's. Placed on 2L O2. O2 sats improved to 95%. Blood glucose 1042, bicarb <7, approximate AG 33. AKI. Hyperkalemic - EKG ordered. Leukocytosis with left shift. CXR negative for PNA, pleural effusion, or PTX. Pending U/A. Consult to hospitalist for admission.   Consulted Dr. Myna Hidalgo of [facility], appreciated his time.  Will admit to stepdown for further management of T1DM DKA, AKI, and AMS.     Roxanna Mew, [place] 05/26/16 [phone]

## 2016-05-25 NOTE — Progress Notes (Signed)
ANTICOAGULATION CONSULT NOTE - Initial Consult  Pharmacy Consult for warfarin Indication: mechanical AVR  Allergies  Allergen Reactions  . Adhesive [Tape] Other (See Comments)    Can burn skin if left on too long  . Celebrex [Celecoxib] Rash  . Detrol [Tolterodine] Hives    Patient Measurements: Height: 5\' 3"  (160 cm) Weight: 121 lb 0.5 oz (54.9 kg) IBW/kg (Calculated) : 52.4 Heparin Dosing Weight:   Vital Signs: Temp: 98.5 F (36.9 C) (07/07 2100) Temp Source: Oral (07/07 2100) BP: 110/69 mmHg (07/07 2000) Pulse Rate: 79 (07/07 1930)  Labs:  Recent Labs  05/24/16 1043 05/25/16 1805 05/25/16 2211  HGB  --  10.5*  --   HCT  --  35.7*  --   PLT  --  445*  --   LABPROT 25.4*  --  20.0*  INR 2.34*  --  1.70*  CREATININE 0.67 1.50*  --     Estimated Creatinine Clearance: 35.1 mL/min (by C-G formula based on Cr of 1.5).   Medical History: Past Medical History  Diagnosis Date  . CAD (coronary artery disease)   . Obesity   . Hypercholesteremia   . HTN (hypertension)   . Dyslipidemia   . Aortic stenosis   . Thrombophlebitis   . Heart murmur   . Myocardial infarction ([facility]) 12/2014  . Pneumonia 11/2014; 12/2014  . GERD (gastroesophageal reflux disease)   . Stroke syndrome [facility]) 1995; 2001    "when my son was born; problems w/speech and L hand since then" (01/19/2015)  . Anxiety   . Depression   . Allergy   . CHF (congestive heart failure) ([facility])   . Rheumatoid arthritis(714.0)     "hands" (07/01/2015)  . Diabetes ([facility]) dx'd 1982    "went straight to insulin"    Assessment: 35 YOF presents with altered mental status and hyperglycemia.  She was discharged from [facility] 05/24/16 with DKA.  She is on warfarin for mechanical aortic valve.    Recent home dose was 7.5mg  daily but recently prescribed 5mg  daily at discharge (on 7/4) and this is the dose reported on [facility] MAR.    Today, 05/25/2016  INR is 1.70, subtherapeutic  CBC: Hgb stable, pltc WNL  Goal of Therapy:   INR 2.5-3.5  Plan:   Will order warfarin 10 mg PO x1   Daily PT/INR  Monitor for bleeding  9/4, PharmD, BCPS Pager [phone] 05/25/2016 10:52 PM

## 2016-05-25 NOTE — ED Provider Notes (Signed)
Medical screening examination/treatment/procedure(s) were conducted as a shared visit with non-physician practitioner(s) and myself.  I personally evaluated the patient during the encounter.   EKG Interpretation None      Patient here with altered mental status as well as hyperglycemia. Blood sugar over 600. Discharge from hospital yesterday for DKA. Which IV hydrate and check labs in patient will likely be readmitted  Lorre Nick, MD 05/25/16 [phone]

## 2016-05-25 NOTE — ED Notes (Signed)
Per EMS-was hospitalized on the 29 th of last month for DKA-currently at [facility] has been reading high-uncontrolled by subcutaneous insulin

## 2016-05-25 NOTE — ED Notes (Signed)
Bed: WA25 Expected date:  Expected time:  Means of arrival:  Comments: EMS - hyperglycemia 

## 2016-05-26 LAB — GLUCOSE, CAPILLARY
GLUCOSE-CAPILLARY: 102 mg/dL — AB (ref 65–99)
GLUCOSE-CAPILLARY: 105 mg/dL — AB (ref 65–99)
GLUCOSE-CAPILLARY: 115 mg/dL — AB (ref 65–99)
GLUCOSE-CAPILLARY: 115 mg/dL — AB (ref 65–99)
GLUCOSE-CAPILLARY: 142 mg/dL — AB (ref 65–99)
GLUCOSE-CAPILLARY: 189 mg/dL — AB (ref 65–99)
GLUCOSE-CAPILLARY: 238 mg/dL — AB (ref 65–99)
GLUCOSE-CAPILLARY: 263 mg/dL — AB (ref 65–99)
GLUCOSE-CAPILLARY: 442 mg/dL — AB (ref 65–99)
GLUCOSE-CAPILLARY: 70 mg/dL (ref 65–99)
Glucose-Capillary: 102 mg/dL — ABNORMAL HIGH (ref 65–99)
Glucose-Capillary: 133 mg/dL — ABNORMAL HIGH (ref 65–99)
Glucose-Capillary: 402 mg/dL — ABNORMAL HIGH (ref 65–99)

## 2016-05-26 LAB — BASIC METABOLIC PANEL
Anion gap: 13 (ref 5–15)
Anion gap: 6 (ref 5–15)
Anion gap: 13 (ref 5–15)
BUN: 24 mg/dL — AB (ref 6–20)
BUN: 21 mg/dL — AB (ref 6–20)
BUN: 19 mg/dL (ref 6–20)
CHLORIDE: 106 mmol/L (ref 101–111)
CHLORIDE: 108 mmol/L (ref 101–111)
CHLORIDE: 107 mmol/L (ref 101–111)
CO2: 18 mmol/L — AB (ref 22–32)
CO2: 25 mmol/L (ref 22–32)
CO2: 18 mmol/L — ABNORMAL LOW (ref 22–32)
Calcium: 8.7 mg/dL — ABNORMAL LOW (ref 8.9–10.3)
Calcium: 8.7 mg/dL — ABNORMAL LOW (ref 8.9–10.3)
Calcium: 8.4 mg/dL — ABNORMAL LOW (ref 8.9–10.3)
Creatinine, Ser: 1.05 mg/dL — ABNORMAL HIGH (ref 0.44–1.00)
Creatinine, Ser: 0.84 mg/dL (ref 0.44–1.00)
Creatinine, Ser: 0.68 mg/dL (ref 0.44–1.00)
GFR calc Af Amer: >60 mL/min (ref >60)
GFR calc Af Amer: >60 mL/min (ref >60)
GFR calc non Af Amer: 59 mL/min — ABNORMAL LOW (ref >60)
GFR calc non Af Amer: >60 mL/min (ref >60)
GFR calc non Af Amer: >60 mL/min (ref >60)
GLUCOSE: 243 mg/dL — AB (ref 65–99)
GLUCOSE: 115 mg/dL — AB (ref 65–99)
GLUCOSE: 169 mg/dL — AB (ref 65–99)
POTASSIUM: 3.7 mmol/L (ref 3.5–5.1)
POTASSIUM: 3.8 mmol/L (ref 3.5–5.1)
POTASSIUM: 4.3 mmol/L (ref 3.5–5.1)
SODIUM: 137 mmol/L (ref 135–145)
SODIUM: 138 mmol/L (ref 135–145)
Sodium: 139 mmol/L (ref 135–145)

## 2016-05-26 LAB — PROTIME-INR
INR: 2.24 — ABNORMAL HIGH (ref 0.00–1.49)
Prothrombin Time: 24.6 seconds — ABNORMAL HIGH (ref 11.6–15.2)

## 2016-05-26 LAB — BLOOD GAS, VENOUS
Acid-base deficit: 5.8 mmol/L — ABNORMAL HIGH (ref 0.0–2.0)
Bicarbonate: 18.1 mEq/L — ABNORMAL LOW (ref 20.0–24.0)
FIO2: 0.21
O2 Saturation: 91.8 %
PCO2 VEN: 31.3 mmHg — AB (ref 45.0–50.0)
PH VEN: 7.379 — AB (ref 7.250–7.300)
Patient temperature: 98.6
TCO2: 17 mmol/L (ref 0–100)
pO2, Ven: 64 mmHg — ABNORMAL HIGH (ref 31.0–45.0)

## 2016-05-26 LAB — TROPONIN I
TROPONIN I: 0.04 ng/mL — AB (ref <0.03)
Troponin I: 0.06 ng/mL (ref <0.03)

## 2016-05-26 LAB — MRSA PCR SCREENING: MRSA by PCR: NEGATIVE

## 2016-05-26 LAB — LACTIC ACID, PLASMA: LACTIC ACID, VENOUS: 4.5 mmol/L — AB (ref 0.5–1.9)

## 2016-05-26 MED ORDER — SODIUM CHLORIDE 0.9 % IV SOLN
INTRAVENOUS | Status: DC
  Administered 2016-05-26: 12:00:00 via INTRAVENOUS

## 2016-05-26 MED ORDER — INSULIN ASPART 100 UNIT/ML ~~LOC~~ SOLN
0.0000 [IU] | Freq: Three times a day (TID) | SUBCUTANEOUS | Status: DC
  Administered 2016-05-26: 15 [IU] via SUBCUTANEOUS
  Administered 2016-05-27: 8 [IU] via SUBCUTANEOUS
  Administered 2016-05-27: 5 [IU] via SUBCUTANEOUS

## 2016-05-26 MED ORDER — INSULIN DETEMIR 100 UNIT/ML ~~LOC~~ SOLN
6.0000 [IU] | Freq: Every day | SUBCUTANEOUS | Status: DC
  Administered 2016-05-26 – 2016-05-27 (×2): 6 [IU] via SUBCUTANEOUS
  Filled 2016-05-26 (×2): qty 0.06

## 2016-05-26 MED ORDER — INSULIN ASPART 100 UNIT/ML ~~LOC~~ SOLN
15.0000 [IU] | Freq: Once | SUBCUTANEOUS | Status: AC
  Administered 2016-05-26: 15 [IU] via SUBCUTANEOUS

## 2016-05-26 MED ORDER — WARFARIN SODIUM 5 MG PO TABS
7.5000 mg | ORAL_TABLET | Freq: Once | ORAL | Status: AC
  Administered 2016-05-26: 7.5 mg via ORAL
  Filled 2016-05-26: qty 1

## 2016-05-26 MED ORDER — INSULIN ASPART 100 UNIT/ML ~~LOC~~ SOLN: 0.0000 [IU] | Freq: Every day | SUBCUTANEOUS | Status: DC

## 2016-05-26 NOTE — Progress Notes (Signed)
CBG was 402.  Informed Dr Cena Benton.  Dr. Cena Benton odered to give sliding scale and recheck in 1-1/2 hours.  Erick Blinks, RN

## 2016-05-26 NOTE — Progress Notes (Signed)
CRITICAL VALUE ALERT  Critical value received:  Lactic acid:6.2  Date of notification:  05/25/16  Time of notification:  2244  Critical value read back:Yes.    Nurse who received alert:  Porfirio Oar  MD notified (1st page):  Keenan Bachelor  Time of first page:  1046  Responding MD:  Keenan Bachelor  Time MD responded:  [phone]

## 2016-05-26 NOTE — Clinical Social Work Note (Addendum)
Clinical Social Work Assessment  Patient Details  Name: Madison [name] MRN: [id number] Date of Birth: 09-14-1961  Date of referral:  05/26/16               Reason for consult:  Discharge Planning, Facility Placement                Permission sought to share information with:  Family Supports, Case Manager Permission granted to share information::  Yes, Verbal Permission Granted  Name::        Agency::     Relationship::     Contact Information:  UXNATF:[phone]  Housing/Transportation Living arrangements for the past 2 months:  [place], [place] of Information:  Patient Patient Interpreter Needed:  None Criminal Activity/Legal Involvement Pertinent to Current Situation/Hospitalization:  No - Comment as needed Significant Relationships:  Siblings, Adult Children Lives with:  Self Do you feel safe going back to the place where you live?  Yes Need for family participation in patient care:  Yes (Comment)  Care giving concerns:  Patient presents at [facility] for uncontrolled bloodsugars.  Patient reports she is from [facility]. Patient reports " I will never go there again. I called them when I fell and it took them a long time to come and help me. I am not going back, I am going home. My sister has set me up with PACE program". Pt. Baseline is walking with cane.   Social Worker assessment / plan:  LCSWA met with patient at bedside and explained reason for consult. Patient agreed to assessment. Patient reports she is not returning to SNF due to falling. Patient wants to go home at D/C. Patient is not willing to go any SNF at time. Patient is frustrated with lack of care she received at [facility]. Patient reports her sister has set her up with community PACE program. The program helps people meet their health care needs in the community instead of going to a nursing home. Patient reports she lives alone and sister and son are supportive and involved in  her care. Patient reports she has health aid that comes into the home daily to help out as well.   Employment status:  Unemployed Nurse, adult PT Recommendations:  Not assessed at this time Information / Referral to community resources:  [place]  Patient/Family's Response to care:  Pt. PT is pending. Patient does not want to return to [name] or any SNF. Patient wants to go home despite needed rehab a week ago. No family at bedside.  Patient/Family's Understanding of and Emotional Response to Diagnosis, Current Treatment, and Prognosis:  Patient reports, "I can manage at home with the PACE program". No family at bedside during this assessment.   Emotional Assessment Appearance:  Appears stated age Attitude/Demeanor/Rapport:   (Cooperative) Affect (typically observed):  Accepting, Calm, Pleasant Orientation:  Oriented to Self, Oriented to Place, Oriented to  Time, Oriented to Situation Alcohol / Substance use:  Never Used Psych involvement (Current and /or in the community):  No (Comment)  Discharge Needs  Concerns to be addressed:  Care Coordination Readmission within the last 30 days:  Yes Current discharge risk:  Lives alone, Dependent with Mobility Barriers to Discharge:  [place], LCSW 05/26/2016, 10:00 AM

## 2016-05-26 NOTE — Progress Notes (Signed)
PROGRESS NOTE    EVANGLINE [name]  XFG:[id number] DOB: 1961-04-16 DOA: 05/25/2016 PCP: [name], NP    Brief Narrative:  Patient is a 55 year old   Assessment & Plan:   Principal Problem:   Type 1 diabetes mellitus with ketoacidosis without coma (HCC) - Improved after insulin drip administration. Patient will be transitioned to subcutaneous insulin as her CO2 has normalized and her blood sugars have remained low. - We'll transfer to MedSurg  Active Problems:   CAD (coronary artery disease) - Stable no chest pain reported    Long term (current) use of anticoagulants/ S/P AVR (aortic valve replacement) - Pharmacy to dose Coumadin    CVA (cerebral infarction) - Stable no new neurological deficits reported. Continue statin    Depression - Stable continue Celexa    Diabetes type 1, uncontrolled (HCC) - Please see discussion above    S/P aortic valve replacement   Essential hypertension   Normocytic anemia   Hyperkalemia - resolved    Leukocytosis   Chronic diastolic heart failure (HCC)     Acute encephalopathy   Hyponatremia: Pseudo - resolved    AKI (acute kidney injury) (HCC) - resolving with fluid administration   DVT prophylaxis: Currently on Coumadin Code Status: Full Family Communication: None at bedside discussed directly with patient Disposition Plan: Transition to MedSurg. Discharge most likely next a.m. with continued improvement  Consultants:   None   Procedures: None   Antimicrobials: None   Subjective: Pt has no new complaints. States she feels better  Objective: Filed Vitals:   05/26/16 0400 05/26/16 0500 05/26/16 0600 05/26/16 0800  BP: 115/38 104/36 108/36   Pulse: 92 95 95   Temp: 98.5 F (36.9 C)   98.5 F (36.9 C)  TempSrc: Axillary   Oral  Resp: 26 15 18    Height:      Weight:      SpO2: 98% 98% 98%     Intake/Output Summary (Last 24 hours) at 05/26/16 0934 Last data filed at 05/26/16 0400  Gross per 24 hour    Intake 1110.38 ml  Output      0 ml  Net 1110.38 ml   Filed Weights   05/25/16 2100 05/26/16 0357  Weight: 54.9 kg (121 lb 0.5 oz) 54.6 kg (120 lb 5.9 oz)    Examination:  General exam: Appears calm and comfortable  Respiratory system: Clear to auscultation. Respiratory effort normal. Cardiovascular system: S1 & S2 heard, RRR. No JVD, + systolic murmur, no rubs, gallops or clicks. No pedal edema. Gastrointestinal system: Abdomen is nondistended, soft and nontender. No organomegaly or masses felt. Normal bowel sounds heard. Central nervous system: Alert and oriented. Answers questions appropriately. No facial asymmetry Head: Atraumatic, normocephalic Skin: No rashes, lesions or ulcers on limited exam Psychiatry: Judgement and insight appear normal. Mood & affect appropriate.     Data Reviewed: I have personally reviewed following labs and imaging studies  CBC:  Recent Labs Lab 05/19/16 1130 05/20/16 0555 05/25/16 1805  WBC 4.7 3.6* 13.3*  NEUTROABS 2.2  --  11.4*  HGB 9.2* 9.8* 10.5*  HCT 28.5* 30.9* 35.7*  MCV 87.7 90.9 98.6  PLT 257 204 445*   Basic Metabolic Panel:  Recent Labs Lab 05/19/16 1130  05/24/16 1043 05/25/16 1805 05/25/16 2211 05/26/16 0121 05/26/16 0400  NA 137  < > 135 127* 135 137 139  K 4.7  < > 4.2 6.7* 4.8 3.7 3.8  CL 109  < > 98* 87* 101 106 108  CO2 22  < > 28 <7* 9* 18* 25  GLUCOSE 116*  < > 249* 1042* 656* 243* 115*  BUN <5*  < > <5* 28* 25* 24* 21*  CREATININE 0.57  < > 0.67 1.50* 1.44* 1.05* 0.84  CALCIUM 8.3*  < > 9.1 9.2 8.7* 8.7* 8.7*  MG 1.8  --   --   --   --   --   --   PHOS 2.3*  --   --   --   --   --   --   < > = values in this interval not displayed. GFR: Estimated Creatinine Clearance: 62.6 mL/min (by C-G formula based on Cr of 0.84). Liver Function Tests:  Recent Labs Lab 05/19/16 1130 05/25/16 1805  AST  --  30  ALT  --  18  ALKPHOS  --  118  BILITOT  --  2.1*  PROT  --  7.0  ALBUMIN 2.0* 3.9    Recent  Labs Lab 05/25/16 1805  LIPASE 32   No results for input(s): AMMONIA in the last 168 hours. Coagulation Profile:  Recent Labs Lab 05/21/16 0231 05/22/16 0853 05/24/16 1043 05/25/16 2211 05/26/16 0400  INR 1.65* 2.71* 2.34* 1.70* 2.24*   Cardiac Enzymes:  Recent Labs Lab 05/25/16 2212 05/26/16 0400  TROPONINI <0.03 0.04*   BNP (last 3 results) No results for input(s): PROBNP in the last 8760 hours. HbA1C: No results for input(s): HGBA1C in the last 72 hours. CBG:  Recent Labs Lab 05/26/16 0352 05/26/16 0452 05/26/16 0551 05/26/16 0650 05/26/16 0752  GLUCAP 115* 105* 102* 102* 133*   Lipid Profile: No results for input(s): CHOL, HDL, LDLCALC, TRIG, CHOLHDL, LDLDIRECT in the last 72 hours. Thyroid Function Tests: No results for input(s): TSH, T4TOTAL, FREET4, T3FREE, THYROIDAB in the last 72 hours. Anemia Panel: No results for input(s): VITAMINB12, FOLATE, FERRITIN, TIBC, IRON, RETICCTPCT in the last 72 hours. Sepsis Labs:  Recent Labs Lab 05/19/16 1130 05/25/16 2211 05/26/16 0121  PROCALCITON 0.74 2.41  --   LATICACIDVEN  --  6.2* 4.5*    Recent Results (from the past 240 hour(s))  Blood Culture (routine x 2)     Status: None   Collection Time: 05/17/16  3:30 AM  Result Value Ref Range Status   Specimen Description BLOOD RIGHT ARM  Final   Special Requests BOTTLES DRAWN AEROBIC AND ANAEROBIC  Final   Culture NO GROWTH 5 DAYS  Final   Report Status 05/22/2016 FINAL  Final  Blood Culture (routine x 2)     Status: None   Collection Time: 05/17/16  3:40 AM  Result Value Ref Range Status   Specimen Description BLOOD LEFT WRIST  Final   Special Requests IN PEDIATRIC BOTTLE  Final   Culture NO GROWTH 5 DAYS  Final   Report Status 05/22/2016 FINAL  Final  Urine culture     Status: None   Collection Time: 05/17/16  3:53 AM  Result Value Ref Range Status   Specimen Description URINE, CATHETERIZED  Final   Special Requests NONE  Final   Culture  NO GROWTH  Final   Report Status 05/18/2016 FINAL  Final  MRSA PCR Screening     Status: None   Collection Time: 05/25/16 11:03 PM  Result Value Ref Range Status   MRSA by PCR NEGATIVE NEGATIVE Final    Comment:        The GeneXpert MRSA Assay (FDA approved for NASAL specimens only), is one  component of a comprehensive MRSA colonization surveillance program. It is not intended to diagnose MRSA infection nor to guide or monitor treatment for MRSA infections.          Radiology Studies: Dg Chest Port 1 View  05/25/2016  CLINICAL DATA:  Altered mental status as well is hyperglycemia EXAM: PORTABLE CHEST 1 VIEW COMPARISON:  05/19/2016 FINDINGS: Sternotomy wires overlie normal cardiac silhouette. Normal pulmonary vasculature. No effusion, infiltrate, or pneumothorax. No acute osseous abnormality. IMPRESSION: No acute cardiopulmonary process. Electronically Signed   By: Genevive Bi M.D.   On: 05/25/2016 19:32        Scheduled Meds: . citalopram  40 mg Oral Daily  . insulin aspart  0-15 Units Subcutaneous TID WC  . insulin aspart  0-5 Units Subcutaneous QHS  . insulin detemir  6 Units Subcutaneous Daily  . pantoprazole  40 mg Oral Daily  . pravastatin  20 mg Oral QHS  . warfarin  7.5 mg Oral ONCE-1800  . Warfarin - Pharmacist Dosing Inpatient   Does not apply q1800   Continuous Infusions: . sodium chloride Stopped (05/26/16 0212)  . dextrose 5 % and 0.45% NaCl 100 mL/hr at 05/26/16 0203     LOS: 1 day    Time spent: > 35 minutes    Penny Pia, MD [facility] Pager [phone]  If 7PM-7AM, please contact night-coverage [email or web address] Password [facility] 05/26/2016, 9:34 AM

## 2016-05-26 NOTE — Progress Notes (Signed)
ANTICOAGULATION CONSULT NOTE - Follow Up Consult  Pharmacy Consult for warfarin Indication: mechanical AVR  Allergies  Allergen Reactions  . Adhesive [Tape] Other (See Comments)    Can burn skin if left on too long  . Celebrex [Celecoxib] Rash  . Detrol [Tolterodine] Hives    Patient Measurements: Height: 5\' 3"  (160 cm) Weight: 120 lb 5.9 oz (54.6 kg) IBW/kg (Calculated) : 52.4  Vital Signs: Temp: 98.5 F (36.9 C) (07/08 0800) Temp Source: Oral (07/08 0800) BP: 108/36 mmHg (07/08 0600) Pulse Rate: 95 (07/08 0600)  Labs:  Recent Labs  05/24/16 1043 05/25/16 1805 05/25/16 2211 05/25/16 2212 05/26/16 0121 05/26/16 0400  HGB  --  10.5*  --   --   --   --   HCT  --  35.7*  --   --   --   --   PLT  --  445*  --   --   --   --   LABPROT 25.4*  --  20.0*  --   --  24.6*  INR 2.34*  --  1.70*  --   --  2.24*  CREATININE 0.67 1.50* 1.44*  --  1.05* 0.84  TROPONINI  --   --   --  <0.03  --  0.04*    Estimated Creatinine Clearance: 62.6 mL/min (by C-G formula based on Cr of 0.84).   Medical History: Past Medical History  Diagnosis Date  . CAD (coronary artery disease)   . Obesity   . Hypercholesteremia   . HTN (hypertension)   . Dyslipidemia   . Aortic stenosis   . Thrombophlebitis   . Heart murmur   . Myocardial infarction (HCC) 12/2014  . Pneumonia 11/2014; 12/2014  . GERD (gastroesophageal reflux disease)   . Stroke syndrome [facility]) 1995; 2001    "when my son was born; problems w/speech and L hand since then" (01/19/2015)  . Anxiety   . Depression   . Allergy   . CHF (congestive heart failure) (HCC)   . Rheumatoid arthritis(714.0)     "hands" (07/01/2015)  . Diabetes (HCC) dx'd 1982    "went straight to insulin"    Assessment: 64 YOF presents with altered mental status and hyperglycemia.  She was discharged from [facility] 05/24/16 with DKA.  She is on warfarin for mechanical aortic valve.    Recent home dose was 7.5mg  daily but recently prescribed 5mg  daily at  discharge (on 7/4) and this is the dose reported on [facility] MAR.    Today, 05/26/2016  INR improved this AM to 2.24, approaching therapeutic range and haven't seen effects yet of 10 mg dose given last night.  CBC: Hgb 10.5 (appears near at baseline), platelets high  Goal of Therapy:  INR 2.5-3.5  Plan:   Warfarin 7.5 mg today  Daily PT/INR  Monitor for bleeding  9/4, PharmD, BCPS Pager: [phone] 05/26/2016 8:36 AM

## 2016-05-26 NOTE — Progress Notes (Signed)
CBG 442.  Informed Dr Cena Benton.  Give 15 units insulin and recheck in 1-1/2hours Erick Blinks, RN

## 2016-05-26 NOTE — Progress Notes (Signed)
Pt stated that she brought her dentures and cell phone to the hospital with her but this RN and CNA Morrie Sheldon could not find the items.  The only personal belongings that was with pt was clothes.  Erick Blinks, RN

## 2016-05-26 NOTE — Progress Notes (Signed)
NP notified of most recent BMET results. Awaiting response.

## 2016-05-27 DIAGNOSIS — A419 Sepsis, unspecified organism: Secondary | ICD-10-CM | POA: Diagnosis not present

## 2016-05-27 DIAGNOSIS — I2510 Atherosclerotic heart disease of native coronary artery without angina pectoris: Secondary | ICD-10-CM | POA: Diagnosis not present

## 2016-05-27 DIAGNOSIS — M6281 Muscle weakness (generalized): Secondary | ICD-10-CM | POA: Diagnosis not present

## 2016-05-27 DIAGNOSIS — E1010 Type 1 diabetes mellitus with ketoacidosis without coma: Secondary | ICD-10-CM | POA: Diagnosis not present

## 2016-05-27 DIAGNOSIS — E0810 Diabetes mellitus due to underlying condition with ketoacidosis without coma: Secondary | ICD-10-CM | POA: Diagnosis not present

## 2016-05-27 DIAGNOSIS — K219 Gastro-esophageal reflux disease without esophagitis: Secondary | ICD-10-CM | POA: Diagnosis present

## 2016-05-27 DIAGNOSIS — I69354 Hemiplegia and hemiparesis following cerebral infarction affecting left non-dominant side: Secondary | ICD-10-CM | POA: Diagnosis not present

## 2016-05-27 DIAGNOSIS — Z8249 Family history of ischemic heart disease and other diseases of the circulatory system: Secondary | ICD-10-CM | POA: Diagnosis not present

## 2016-05-27 DIAGNOSIS — Z6820 Body mass index (BMI) 20.0-20.9, adult: Secondary | ICD-10-CM | POA: Diagnosis not present

## 2016-05-27 DIAGNOSIS — E861 Hypovolemia: Secondary | ICD-10-CM | POA: Diagnosis present

## 2016-05-27 DIAGNOSIS — Z5181 Encounter for therapeutic drug level monitoring: Secondary | ICD-10-CM | POA: Diagnosis not present

## 2016-05-27 DIAGNOSIS — I69322 Dysarthria following cerebral infarction: Secondary | ICD-10-CM | POA: Diagnosis not present

## 2016-05-27 DIAGNOSIS — E10649 Type 1 diabetes mellitus with hypoglycemia without coma: Secondary | ICD-10-CM | POA: Diagnosis not present

## 2016-05-27 DIAGNOSIS — R739 Hyperglycemia, unspecified: Secondary | ICD-10-CM | POA: Diagnosis not present

## 2016-05-27 DIAGNOSIS — I110 Hypertensive heart disease with heart failure: Secondary | ICD-10-CM | POA: Diagnosis not present

## 2016-05-27 DIAGNOSIS — I252 Old myocardial infarction: Secondary | ICD-10-CM | POA: Diagnosis not present

## 2016-05-27 DIAGNOSIS — E440 Moderate protein-calorie malnutrition: Secondary | ICD-10-CM | POA: Diagnosis not present

## 2016-05-27 DIAGNOSIS — E875 Hyperkalemia: Secondary | ICD-10-CM | POA: Diagnosis present

## 2016-05-27 DIAGNOSIS — R278 Other lack of coordination: Secondary | ICD-10-CM | POA: Diagnosis not present

## 2016-05-27 DIAGNOSIS — N179 Acute kidney failure, unspecified: Secondary | ICD-10-CM | POA: Diagnosis not present

## 2016-05-27 DIAGNOSIS — I5042 Chronic combined systolic (congestive) and diastolic (congestive) heart failure: Secondary | ICD-10-CM | POA: Diagnosis not present

## 2016-05-27 DIAGNOSIS — R6521 Severe sepsis with septic shock: Secondary | ICD-10-CM | POA: Diagnosis not present

## 2016-05-27 DIAGNOSIS — I10 Essential (primary) hypertension: Secondary | ICD-10-CM | POA: Diagnosis not present

## 2016-05-27 DIAGNOSIS — Z952 Presence of prosthetic heart valve: Secondary | ICD-10-CM | POA: Diagnosis not present

## 2016-05-27 DIAGNOSIS — I5032 Chronic diastolic (congestive) heart failure: Secondary | ICD-10-CM | POA: Diagnosis not present

## 2016-05-27 DIAGNOSIS — Z7901 Long term (current) use of anticoagulants: Secondary | ICD-10-CM | POA: Diagnosis not present

## 2016-05-27 DIAGNOSIS — R2689 Other abnormalities of gait and mobility: Secondary | ICD-10-CM | POA: Diagnosis not present

## 2016-05-27 DIAGNOSIS — Z8672 Personal history of thrombophlebitis: Secondary | ICD-10-CM | POA: Diagnosis not present

## 2016-05-27 DIAGNOSIS — E1065 Type 1 diabetes mellitus with hyperglycemia: Secondary | ICD-10-CM | POA: Diagnosis not present

## 2016-05-27 DIAGNOSIS — Z794 Long term (current) use of insulin: Secondary | ICD-10-CM | POA: Diagnosis not present

## 2016-05-27 DIAGNOSIS — R7309 Other abnormal glucose: Secondary | ICD-10-CM | POA: Diagnosis not present

## 2016-05-27 DIAGNOSIS — E785 Hyperlipidemia, unspecified: Secondary | ICD-10-CM | POA: Diagnosis not present

## 2016-05-27 DIAGNOSIS — E108 Type 1 diabetes mellitus with unspecified complications: Secondary | ICD-10-CM | POA: Diagnosis not present

## 2016-05-27 DIAGNOSIS — E162 Hypoglycemia, unspecified: Secondary | ICD-10-CM | POA: Diagnosis not present

## 2016-05-27 DIAGNOSIS — E1069 Type 1 diabetes mellitus with other specified complication: Secondary | ICD-10-CM | POA: Diagnosis not present

## 2016-05-27 DIAGNOSIS — R791 Abnormal coagulation profile: Secondary | ICD-10-CM | POA: Diagnosis not present

## 2016-05-27 DIAGNOSIS — E860 Dehydration: Secondary | ICD-10-CM | POA: Diagnosis present

## 2016-05-27 LAB — BASIC METABOLIC PANEL
ANION GAP: 14 (ref 5–15)
BUN: 9 mg/dL (ref 6–20)
CO2: 15 mmol/L — AB (ref 22–32)
Calcium: 8.4 mg/dL — ABNORMAL LOW (ref 8.9–10.3)
Chloride: 101 mmol/L (ref 101–111)
Creatinine, Ser: 0.69 mg/dL (ref 0.44–1.00)
GLUCOSE: 408 mg/dL — AB (ref 65–99)
POTASSIUM: 4.4 mmol/L (ref 3.5–5.1)
Sodium: 130 mmol/L — ABNORMAL LOW (ref 135–145)

## 2016-05-27 LAB — GLUCOSE, CAPILLARY
Glucose-Capillary: 262 mg/dL — ABNORMAL HIGH (ref 65–99)
Glucose-Capillary: 292 mg/dL — ABNORMAL HIGH (ref 65–99)

## 2016-05-27 MED ORDER — INSULIN DETEMIR 100 UNIT/ML ~~LOC~~ SOLN: 8.0000 [IU] | Freq: Every day | SUBCUTANEOUS | Status: DC

## 2016-05-27 MED ORDER — WARFARIN SODIUM 5 MG PO TABS: 7.5000 mg | ORAL_TABLET | Freq: Once | ORAL | Status: DC

## 2016-05-27 NOTE — Progress Notes (Signed)
Inpatient Diabetes Program Recommendations  AACE/ADA: New Consensus Statement on Inpatient Glycemic Control (2015)  Target Ranges:  Prepandial:   less than 140 mg/dL      Peak postprandial:   less than 180 mg/dL (1-2 hours)      Critically ill patients:  140 - 180 mg/dL   Lab Results  Component Value Date   GLUCAP 292* 05/27/2016   HGBA1C 9.9* 01/28/2016    Review of Glycemic Control  Post-prandial blood sugars elevated.  Inpatient Diabetes Program Recommendations:    Add Novolog 2 units tidwc for meal coverage insulin if pt eats > 50% meals.  Will continue to follow. Thank you. Ailene Ards, RD, LDN, CDE Inpatient Diabetes Coordinator [phone]

## 2016-05-27 NOTE — Progress Notes (Signed)
ANTICOAGULATION CONSULT NOTE - Follow Up Consult  Pharmacy Consult for warfarin Indication: mechanical AVR  Allergies  Allergen Reactions  . Adhesive [Tape] Other (See Comments)    Can burn skin if left on too long  . Celebrex [Celecoxib] Rash  . Detrol [Tolterodine] Hives    Patient Measurements: Height: 5\' 3"  (160 cm) Weight: 119 lb 14.4 oz (54.386 kg) IBW/kg (Calculated) : 52.4  Vital Signs: Temp: 98.3 F (36.8 C) (07/09 0433) Temp Source: Oral (07/09 0433) BP: 142/56 mmHg (07/09 0433) Pulse Rate: 70 (07/09 0433)  Labs:  Recent Labs  05/24/16 1043 05/25/16 1805 05/25/16 2211 05/25/16 2212 05/26/16 0121 05/26/16 0400 05/26/16 0917  HGB  --  10.5*  --   --   --   --   --   HCT  --  35.7*  --   --   --   --   --   PLT  --  445*  --   --   --   --   --   LABPROT 25.4*  --  20.0*  --   --  24.6*  --   INR 2.34*  --  1.70*  --   --  2.24*  --   CREATININE 0.67 1.50* 1.44*  --  1.05* 0.84 0.68  TROPONINI  --   --   --  <0.03  --  0.04* 0.06*    Estimated Creatinine Clearance: 65.7 mL/min (by C-G formula based on Cr of 0.68).   Medical History: Past Medical History  Diagnosis Date  . CAD (coronary artery disease)   . Obesity   . Hypercholesteremia   . HTN (hypertension)   . Dyslipidemia   . Aortic stenosis   . Thrombophlebitis   . Heart murmur   . Myocardial infarction (HCC) 12/2014  . Pneumonia 11/2014; 12/2014  . GERD (gastroesophageal reflux disease)   . 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 Anxiety   . Depression   . Allergy   . CHF (congestive heart failure) (HCC)   . Rheumatoid arthritis(714.0)     "hands" (07/01/2015)  . Diabetes (HCC) dx'd 1982    "went straight to insulin"    Assessment: 25 YOF presents with altered mental status and hyperglycemia.  She was discharged from [facility] 05/24/16 with DKA.  She is on warfarin for mechanical aortic valve.    Recent home dose was  7.5mg  daily but recently prescribed 5mg  daily at discharge (on 7/4) and this is the dose reported on [facility] MAR.    Today, 05/27/2016  Patient refused INR blood draw this AM.  Anticipate INR is close or within therapeutic range based on upward trend and 10 mg dose given on admission.    CBC 7/7: Hgb 10.5 (appears near at baseline), platelets high  Goal of Therapy:  INR 2.5-3.5  Plan:   Warfarin 7.5 mg today per home regimen  Daily PT/INR  If pt remains inpatient tomorrow, will need INR drawn in AM.  9/4, PharmD, BCPS Pager: [phone] 05/27/2016 7:51 AM

## 2016-05-27 NOTE — Clinical Social Work Note (Signed)
Per MD patient ready to DC back to [facility]. RN, patient/family (sister), and facility notified of patient's DC. RN given number for report. DC packet on patient's chart. Ambulance transport requested for patient. CSW signing off at this time.  Roddie Mc MSW, [place], [place], [phone]
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
## 2016-05-27 NOTE — NC FL2 (Signed)
[place] MEDICAID FL2 LEVEL OF CARE SCREENING TOOL     IDENTIFICATION  Patient Name: Madison [name] Birthdate: 12-14-1960 Sex: female Admission Date (Current Location): 05/25/2016  [facility] and [place] Number:  Producer, television/film/video and Address:  [facility],  [address] [place] [address]      Provider Number: [id number]  Attending Physician Name and Address:  Penny Pia, MD  Relative Name and Phone Number:       Current Level of Care: Hospital Recommended Level of Care: Skilled Nursing Facility Prior Approval Number:    Date Approved/Denied:   PASRR Number: [id number]  Discharge Plan: SNF    Current Diagnoses: Patient Active Problem List   Diagnosis Date Noted  . SIRS (systemic inflammatory response syndrome) (HCC) 05/25/2016  . Type 1 diabetes mellitus with ketoacidosis without coma (HCC)   . MDD (major depressive disorder), recurrent episode, moderate (HCC)   . DKA (diabetic ketoacidoses) (HCC) 05/17/2016  . Aspiration pneumonia (HCC) 05/17/2016  . Diabetic ketoacidosis without coma (HCC)   . Sepsis (HCC)   . 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 MSOF (multiple systems organ failure) 05/01/2016  . Acute hypoxemic respiratory failure (HCC)   . Type I diabetes mellitus with complication, uncontrolled (HCC) 03/29/2016  . Adjustment disorder with mixed anxiety and depressed mood 03/28/2016  . HLD (hyperlipidemia) 03/27/2016  . AKI (acute kidney injury) (HCC) 03/27/2016  . Chronic combined systolic and diastolic congestive heart failure (HCC)   . Diabetic ketoacidosis (HCC) 03/26/2016  . Type 1 diabetes, HbA1c goal < 7% (HCC)   . Hypotension 03/08/2016  . Uncontrolled type 1 diabetes mellitus with ketoacidotic coma (HCC)   . Acute encephalopathy 02/28/2016  . Hyponatremia: Pseudo 02/28/2016  . Lactic acidosis   . Diabetic ketoacidosis with coma associated with type 1 diabetes mellitus (HCC)   . Hypoglycemia   . Chronic diastolic heart  failure (HCC)   . Esophageal reflux   . S/P AVR (aortic valve replacement)   . ARF (acute renal failure) (HCC) 01/22/2016  . CHF (congestive heart failure) (HCC) 01/22/2016  . Diabetic ketoacidosis without coma associated with type 1 diabetes mellitus (HCC)   . DKA, type 1 (HCC) 11/23/2015  . Constipation 11/23/2015  . Leukocytosis   . Pressure ulcer 08/02/2015  . Diabetic ketoacidosis with coma associated with other specified diabetes mellitus (HCC)   . Closed left ankle fracture 07/01/2015  . Trimalleolar fracture of left ankle 07/01/2015  . Hyperkalemia   . Insomnia 06/16/2015  . Normocytic anemia 01/31/2015  . Acute on chronic combined systolic and diastolic CHF (congestive heart failure) (HCC)   . Essential hypertension   . Elevated troponin   . Diabetes type 1, uncontrolled (HCC)   . Demand ischemia (HCC)   . S/P aortic valve replacement   . Hypokalemia   . NSTEMI (non-ST elevated myocardial infarction) (HCC) 12/08/2014  . Blood poisoning (HCC)   . Supratherapeutic INR   . Severe sepsis with acute organ dysfunction (HCC) 12/03/2014  . Spastic hemiplegia affecting nondominant side (HCC) 11/29/2014  . Dysphagia, pharyngoesophageal phase 10/04/2014  . Sinus tachycardia (HCC) 09/30/2014  . Depression 04/29/2014  . CVA (cerebral infarction) 03/12/2014  . Acute respiratory failure (HCC) 03/12/2014  . Chronic anticoagulation 07/28/2013  . Long term (current) use of anticoagulants 07/28/2013  . CAD (coronary artery disease)   . Aortic stenosis   . Hyperlipidemia   . Rheumatoid arthritis (HCC)     Orientation RESPIRATION BLADDER Height & Weight  Self, Time, Situation, Place  Normal Continent Weight: 54.386 kg (119 lb 14.4 oz) Height:  5\' 3"  (160 cm)  BEHAVIORAL SYMPTOMS/MOOD NEUROLOGICAL BOWEL NUTRITION STATUS   (NONE)  (NONE) Continent Diet (Carb modified)  AMBULATORY STATUS COMMUNICATION OF NEEDS Skin   Extensive Assist Verbally Normal                        Personal Care Assistance Level of Assistance  Bathing, Dressing, Feeding Bathing Assistance: Limited assistance Feeding assistance: Independent Dressing Assistance: Limited assistance     Functional Limitations Info  Sight, Speech, Hearing Sight Info: Adequate Hearing Info: Adequate Speech Info: Adequate    SPECIAL CARE FACTORS FREQUENCY        PT Frequency: 5/week OT Frequency: 5/week            Contractures Contractures Info: Not present    Additional Factors Info  Code Status, Psychotropic, Allergies Code Status Info: Full Allergies Info: Adhesive, celebrex, detrol. Psychotropic Info: Celexa 40mg          Current Medications (05/27/2016):  This is the current hospital active medication list Current Facility-Administered Medications  Medication Dose Route Frequency Provider Last Rate Last Dose  . 0.9 %  sodium chloride infusion   Intravenous Continuous , MD 75 mL/hr at 05/26/16 1139    . citalopram (CELEXA) tablet 40 mg  40 mg Oral Daily Penny Pia, MD   40 mg at 05/27/16 1045  . insulin aspart (novoLOG) injection 0-15 Units  0-15 Units Subcutaneous TID WC 07/28/16, MD   5 Units at 05/27/16 0850  . insulin aspart (novoLOG) injection 0-5 Units  0-5 Units Subcutaneous QHS Penny Pia, MD   0 Units at 05/26/16 2200  . insulin detemir (LEVEMIR) injection 6 Units  6 Units Subcutaneous Daily Penny Pia, MD   6 Units at 05/27/16 1047  . pantoprazole (PROTONIX) EC tablet 40 mg  40 mg Oral Daily Penny Pia, MD   40 mg at 05/27/16 1045  . pravastatin (PRAVACHOL) tablet 20 mg  20 mg Oral QHS Briscoe Deutscher, MD   20 mg at 05/26/16 2117  . warfarin (COUMADIN) tablet 7.5 mg  7.5 mg Oral ONCE-1800 07/27/16 [place], RPH      . Warfarin - Pharmacist Dosing Inpatient   Does not apply q1800 2118, RPH   0  at 05/26/16 1800     Discharge Medications: Please see discharge summary for a list of discharge medications.  Relevant Imaging  Results:  Relevant Lab Results:   Additional Information SS#: Adalberto Cole  07/27/16, LCSW

## 2016-05-27 NOTE — Discharge Summary (Signed)
Physician Discharge Summary  Madison [name] [id number] DOB: 10-01-1961 DOA: 05/25/2016  PCP: Dorothyann Peng, NP  Admit date: 05/25/2016 Discharge date: 05/27/2016  Time spent: > 35 minutes  Recommendations for Outpatient Follow-up:  1. Monitor blood sugars and adjust hypoglycemic agents accordingly. Will increase Levemir to 8 units subcutaneous daily. Please continue to monitor closely and continue diabetic diet   Discharge Diagnoses:  Principal Problem:   Type 1 diabetes mellitus with ketoacidosis without coma ([place]) Active Problems:   CAD (coronary artery disease)   Long term (current) use of anticoagulants   CVA (cerebral infarction)   Depression   Diabetes type 1, uncontrolled (HCC)   S/P aortic valve replacement   Essential hypertension   Normocytic anemia   Hyperkalemia   Leukocytosis   Chronic diastolic heart failure (HCC)   S/P AVR (aortic valve replacement)   Acute encephalopathy   Hyponatremia: Pseudo   Diabetic ketoacidosis (HCC)   AKI (acute kidney injury) (HCC)   Acute hypoxemic respiratory failure (HCC)   DKA (diabetic ketoacidoses) (HCC)   SIRS (systemic inflammatory response syndrome) ([place])   Discharge Condition: Stable  Diet recommendation: Diabetic diet  Filed Weights   05/25/16 2100 05/26/16 0357 05/27/16 0433  Weight: 54.9 kg (121 lb 0.5 oz) 54.6 kg (120 lb 5.9 oz) 54.386 kg (119 lb 14.4 oz)    History of present illness:  55 year old with history of diabetes who presented with DKA.  Hospital Course:  DKA - Resolved after insulin drip administration. Patient successfully transitioned to subcutaneous insulin. Will need close monitoring at SNF for further adjustments in hypoglycemic agents.  For other known medical conditions listed above will continue home medication regimen listed below  Procedures:  None  Consultations:  None  Discharge Exam: Filed Vitals:   05/26/16 2105 05/27/16 0433  BP: 121/55 142/56  Pulse: 85 70  Temp: 98.7 F  (37.1 C) 98.3 F (36.8 C)  Resp: 16 14    General: Patient in no acute distress, alert and awake, patient smiling Cardiovascular: Regular rate and rhythm, no murmurs or rubs Respiratory: No increased work of breathing, no wheezes  Discharge Instructions   Discharge Instructions    Call MD for:  difficulty breathing, headache or visual disturbances    Complete by:  As directed      Call MD for:  extreme fatigue    Complete by:  As directed      Call MD for:  temperature >100.4    Complete by:  As directed      Diet - low sodium heart healthy    Complete by:  As directed      Discharge instructions    Complete by:  As directed   Recommend monitoring blood sugars daily and adjusting hypoglycemic agents accordingly.     Increase activity slowly    Complete by:  As directed           Current Discharge Medication List    CONTINUE these medications which have CHANGED   Details  insulin detemir (LEVEMIR) 100 UNIT/ML injection Inject 0.08 mLs (8 Units total) into the skin daily. Qty: 10 mL, Refills: 1   Associated Diagnoses: Uncontrolled type 1 diabetes mellitus with complication ([place])      CONTINUE these medications which have NOT CHANGED   Details  citalopram (CELEXA) 40 MG tablet Take 1 tablet (40 mg total) by mouth daily. Qty: 90 tablet, Refills: 3   Associated Diagnoses: Depression    furosemide (LASIX) 20 MG tablet Take 1 tablet (20  mg total) by mouth daily.    glucose blood test strip Use as instructed Qty: 100 each, Refills: 12    insulin aspart (NOVOLOG FLEXPEN) 100 UNIT/ML FlexPen Inject 0-9 Units into the skin 3 (three) times daily with meals. CBG < 70: eat or drink something and recheck, CBG 70 - 120: 0 units CBG 121 - 150: 2 unit CBG 151 - 200: 3 units CBG 201 - 250: 5 units CBG 251 - 300:  6units CBG 301 - 350:  7units CBG 351 - 400: 10 units CBG > 400: call MD. Otho Darner: 15 mL, Refills: 0   Associated Diagnoses: Uncontrolled type 1 diabetes mellitus with  complication (HCC)    omeprazole (PRILOSEC) 40 MG capsule TAKE ONE CAPSULE BY MOUTH EVERY DAY Qty: 90 capsule, Refills: 1    ondansetron (ZOFRAN) 4 MG tablet Take 4 mg by mouth every 4 (four) hours as needed for nausea or vomiting. Reported on 05/16/2016    pravastatin (PRAVACHOL) 20 MG tablet Take 20 mg by mouth daily.    warfarin (COUMADIN) 5 MG tablet Take 1 tablet (5 mg total) by mouth daily at 6 PM. Titrate for INR 2.5-3.5    Blood Glucose Monitoring Suppl ([place]) w/Device KIT Use to check glucose 2x per day Qty: 1 each, Refills: 0      STOP taking these medications     insulin aspart (NOVOLOG) 100 UNIT/ML injection        Allergies  Allergen Reactions  . Adhesive [Tape] Other (See Comments)    Can burn skin if left on too long  . Celebrex [Celecoxib] Rash  . Detrol [Tolterodine] Hives      The results of significant diagnostics from this hospitalization (including imaging, microbiology, ancillary and laboratory) are listed below for reference.    Significant Diagnostic Studies: Dg Chest 2 View  05/19/2016  CLINICAL DATA:  Followup right upper lobe infiltrate. EXAM: CHEST  2 VIEW COMPARISON:  05/17/2016 FINDINGS: Cardiac shadow is within normal limits. Postsurgical changes are again seen. The lungs are well aerated without focal infiltrate. Mild interstitial edema is seen. Small bilateral pleural with. IMPRESSION: Mild interstitial edema and small pleural effusions. Electronically Signed   By: Inez Catalina M.D.   On: 05/19/2016 10:19   Ct Head Wo Contrast  04/30/2016  CLINICAL DATA:  Acute onset of shortness of breath. Initial encounter. EXAM: CT HEAD WITHOUT CONTRAST TECHNIQUE: Contiguous axial images were obtained from the base of the skull through the vertex without intravenous contrast. COMPARISON:  CT of the head performed 02/26/2016 FINDINGS: There is no evidence of acute infarction, mass lesion, or intra- or extra-axial hemorrhage on CT. Prominence of  the ventricles and sulci reflects mild to moderate cortical volume loss. 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 Diffuse periventricular and subcortical white matter change likely reflects small vessel ischemic microangiopathy. Small chronic infarcts are noted at the cerebellar hemispheres bilaterally, more prominent on the left. The brainstem and fourth ventricle are within normal limits. The basal ganglia are unremarkable in appearance. No mass effect or midline shift is seen. There is no evidence of fracture; visualized osseous structures are unremarkable in appearance. The orbits are within normal limits. The paranasal sinuses and mastoid air cells are well-aerated. No significant soft tissue abnormalities are seen. IMPRESSION: 1. No acute intracranial pathology seen on CT. 2. Mild to moderate cortical volume loss and diffuse small vessel  ischemic microangiopathy. 3. 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 4. Small chronic infarcts at the cerebellar hemispheres bilaterally, more prominent on the left. Electronically Signed   By: Garald Balding M.D.   On: 04/30/2016 23:43   Dg Chest Port 1 View  05/25/2016  CLINICAL DATA:  Altered mental status as well is hyperglycemia EXAM: PORTABLE CHEST 1 VIEW COMPARISON:  05/19/2016 FINDINGS: Sternotomy wires overlie normal cardiac silhouette. Normal pulmonary vasculature. No effusion, infiltrate, or pneumothorax. No acute osseous abnormality. IMPRESSION: No acute cardiopulmonary process. Electronically Signed   By: Suzy Bouchard M.D.   On: 05/25/2016 19:32   Dg Chest Port 1 View  05/17/2016  CLINICAL DATA:  Hyperglycemia and hypotension EXAM: PORTABLE CHEST 1 VIEW COMPARISON:  05/10/2016 FINDINGS: Mild airspace opacity in the right  upper lobe may represent early infectious infiltrate or aspiration. The left lung is clear. There is no large effusion. The pulmonary vasculature is normal. IMPRESSION: Question early infectious infiltrate or aspiration in the right upper lobe. Electronically Signed   By: Andreas Newport M.D.   On: 05/17/2016 03:36   Dg Chest Port 1 View  05/10/2016  CLINICAL DATA:  Diabetic ketoacidosis. EXAM: PORTABLE CHEST 1 VIEW COMPARISON:  05/02/2016 FINDINGS: Sternotomy wires are unchanged. Interval removal of nasogastric tube and endotracheal tube. Lungs are adequately inflated and otherwise clear. Cardiomediastinal silhouette and remainder of the exam is unchanged. IMPRESSION: No active disease. Electronically Signed   By: Marin Olp M.D.   On: 05/10/2016 13:39   Dg Chest Port 1 View  05/02/2016  CLINICAL DATA:  Hypoxia EXAM: PORTABLE CHEST 1 VIEW COMPARISON:  April 30, 2016 FINDINGS: Endotracheal tube tip is in the right main bronchus. Nasogastric tube tip and side port are in the stomach. No pneumothorax. There is no edema or consolidation. Heart size and pulmonary vascular normal. No adenopathy. IMPRESSION: Endotracheal tube tip in proximal right main bronchus. Advise withdrawing endotracheal tube approximately 4 to 4.5 cm. No pneumothorax. No edema or consolidation. Critical Value/emergent results were called by telephone at the time of interpretation on 05/02/2016 at 7:01 am to Arletta Bale, RN, who verbally acknowledged these results. Electronically Signed   By: Lowella Grip III M.D.   On: 05/02/2016 07:02   Dg Chest Portable 1 View  04/30/2016  CLINICAL DATA:  Unresponsive patient. Endotracheal tube and enteric tubes were placed. Shortness of breath. EXAM: PORTABLE CHEST 1 VIEW COMPARISON:  03/26/2016 FINDINGS: Postoperative changes in the mediastinum. An endotracheal tube has been placed with tip measuring 3 cm above the carina. An enteric tube has been placed with tip in the left upper quadrant  consistent with location in the upper stomach. Normal heart size and pulmonary vascularity. No focal airspace disease or consolidation in the lungs. No blunting of costophrenic angles. No pneumothorax. Mediastinal contours appear intact. IMPRESSION: Appliances appear to be in satisfactory position. No evidence of active pulmonary disease. Electronically Signed   By: Lucienne Capers M.D.   On: 04/30/2016 23:15    Microbiology: Recent Results (from the past 240 hour(s))  MRSA PCR Screening     Status: None   Collection Time: 05/25/16 11:03 PM  Result Value Ref Range Status   MRSA by PCR NEGATIVE NEGATIVE Final    Comment:        The GeneXpert MRSA Assay (FDA approved for NASAL specimens only), is one component of a comprehensive MRSA colonization surveillance program. It is not  intended to diagnose MRSA infection nor to guide or monitor treatment for MRSA infections.      Labs: Basic Metabolic Panel:  Recent Labs Lab 05/25/16 1805 05/25/16 2211 05/26/16 0121 05/26/16 0400 05/26/16 0917  NA 127* 135 137 139 138  K 6.7* 4.8 3.7 3.8 4.3  CL 87* 101 106 108 107  CO2 <7* 9* 18* 25 18*  GLUCOSE 1042* 656* 243* 115* 169*  BUN 28* 25* 24* 21* 19  CREATININE 1.50* 1.44* 1.05* 0.84 0.68  CALCIUM 9.2 8.7* 8.7* 8.7* 8.4*   Liver Function Tests:  Recent Labs Lab 05/25/16 1805  AST 30  ALT 18  ALKPHOS 118  BILITOT 2.1*  PROT 7.0  ALBUMIN 3.9    Recent Labs Lab 05/25/16 1805  LIPASE 32   No results for input(s): AMMONIA in the last 168 hours. CBC:  Recent Labs Lab 05/25/16 1805  WBC 13.3*  NEUTROABS 11.4*  HGB 10.5*  HCT 35.7*  MCV 98.6  PLT 445*   Cardiac Enzymes:  Recent Labs Lab 05/25/16 2212 05/26/16 0400 05/26/16 0917  TROPONINI <0.03 0.04* 0.06*   BNP: BNP (last 3 results)  Recent Labs  03/17/16 1801 03/27/16 0832 05/25/16 1805  BNP 95.3 73.7 86.2    ProBNP (last 3 results) No results for input(s): PROBNP in the last 8760  hours.  CBG:  Recent Labs Lab 05/26/16 1416 05/26/16 1615 05/26/16 2103 05/27/16 0759 05/27/16 1206  GLUCAP 238* 115* 70 262* 292*    Signed:  Velvet Bathe MD.  [facility] 05/27/2016, 2:00 PM

## 2016-05-27 NOTE — Clinical Social Work Note (Addendum)
CSW was notified by unit nurse tech that patient is requesting to speak with CSW regarding going back to [place]. CSW has review previous CSW assessment, but the patient appears to have changed her mind about going back to [place]. Facility states they will take her back today. Discussed with MD to confirm DC. CSW will assist.   CSW notes that patient does not have PT ordered or existing evaluation, CSW has requested unit have PT see patient ASAP for insurance authorization.   Roddie Mc MSW, [place], [place], [phone]

## 2016-05-28 ENCOUNTER — Ambulatory Visit: Payer: Self-pay

## 2016-05-29 ENCOUNTER — Ambulatory Visit: Payer: Self-pay

## 2016-05-31 LAB — CULTURE, BLOOD (ROUTINE X 2)
CULTURE: NO GROWTH
Culture: NO GROWTH

## 2016-06-01 ENCOUNTER — Encounter (HOSPITAL_COMMUNITY): Payer: Self-pay

## 2016-06-01 ENCOUNTER — Inpatient Hospital Stay (HOSPITAL_COMMUNITY)
Admission: EM | Admit: 2016-06-01 | Discharge: 2016-06-04 | DRG: 871 | Disposition: A | Discharge status: Skilled Nursing Facility | Payer: Commercial Managed Care - HMO | Attending: Internal Medicine | Admitting: Internal Medicine

## 2016-06-01 ENCOUNTER — Emergency Department (HOSPITAL_COMMUNITY): Payer: Commercial Managed Care - HMO

## 2016-06-01 DIAGNOSIS — I110 Hypertensive heart disease with heart failure: Secondary | ICD-10-CM | POA: Diagnosis not present

## 2016-06-01 DIAGNOSIS — I5032 Chronic diastolic (congestive) heart failure: Secondary | ICD-10-CM | POA: Diagnosis not present

## 2016-06-01 DIAGNOSIS — I2510 Atherosclerotic heart disease of native coronary artery without angina pectoris: Secondary | ICD-10-CM | POA: Diagnosis not present

## 2016-06-01 DIAGNOSIS — E1065 Type 1 diabetes mellitus with hyperglycemia: Secondary | ICD-10-CM | POA: Diagnosis not present

## 2016-06-01 DIAGNOSIS — R6521 Severe sepsis with septic shock: Secondary | ICD-10-CM | POA: Diagnosis not present

## 2016-06-01 DIAGNOSIS — E440 Moderate protein-calorie malnutrition: Secondary | ICD-10-CM | POA: Diagnosis present

## 2016-06-01 DIAGNOSIS — Z8672 Personal history of thrombophlebitis: Secondary | ICD-10-CM | POA: Diagnosis not present

## 2016-06-01 DIAGNOSIS — E785 Hyperlipidemia, unspecified: Secondary | ICD-10-CM | POA: Diagnosis present

## 2016-06-01 DIAGNOSIS — R791 Abnormal coagulation profile: Secondary | ICD-10-CM

## 2016-06-01 DIAGNOSIS — A419 Sepsis, unspecified organism: Secondary | ICD-10-CM | POA: Diagnosis not present

## 2016-06-01 DIAGNOSIS — E0810 Diabetes mellitus due to underlying condition with ketoacidosis without coma: Secondary | ICD-10-CM | POA: Diagnosis not present

## 2016-06-01 DIAGNOSIS — M6281 Muscle weakness (generalized): Secondary | ICD-10-CM | POA: Diagnosis not present

## 2016-06-01 DIAGNOSIS — R278 Other lack of coordination: Secondary | ICD-10-CM | POA: Diagnosis not present

## 2016-06-01 DIAGNOSIS — R6520 Severe sepsis without septic shock: Secondary | ICD-10-CM | POA: Diagnosis not present

## 2016-06-01 DIAGNOSIS — E860 Dehydration: Secondary | ICD-10-CM | POA: Diagnosis present

## 2016-06-01 DIAGNOSIS — N179 Acute kidney failure, unspecified: Secondary | ICD-10-CM | POA: Diagnosis not present

## 2016-06-01 DIAGNOSIS — I252 Old myocardial infarction: Secondary | ICD-10-CM | POA: Diagnosis not present

## 2016-06-01 DIAGNOSIS — E10649 Type 1 diabetes mellitus with hypoglycemia without coma: Secondary | ICD-10-CM | POA: Diagnosis not present

## 2016-06-01 DIAGNOSIS — Z794 Long term (current) use of insulin: Secondary | ICD-10-CM

## 2016-06-01 DIAGNOSIS — Z952 Presence of prosthetic heart valve: Secondary | ICD-10-CM

## 2016-06-01 DIAGNOSIS — Z7901 Long term (current) use of anticoagulants: Secondary | ICD-10-CM

## 2016-06-01 DIAGNOSIS — Z8249 Family history of ischemic heart disease and other diseases of the circulatory system: Secondary | ICD-10-CM | POA: Diagnosis not present

## 2016-06-01 DIAGNOSIS — R739 Hyperglycemia, unspecified: Secondary | ICD-10-CM | POA: Diagnosis not present

## 2016-06-01 DIAGNOSIS — E1069 Type 1 diabetes mellitus with other specified complication: Secondary | ICD-10-CM | POA: Diagnosis not present

## 2016-06-01 DIAGNOSIS — I69322 Dysarthria following cerebral infarction: Secondary | ICD-10-CM

## 2016-06-01 DIAGNOSIS — Z6820 Body mass index (BMI) 20.0-20.9, adult: Secondary | ICD-10-CM | POA: Diagnosis not present

## 2016-06-01 DIAGNOSIS — E875 Hyperkalemia: Secondary | ICD-10-CM | POA: Diagnosis present

## 2016-06-01 DIAGNOSIS — Z5181 Encounter for therapeutic drug level monitoring: Secondary | ICD-10-CM | POA: Diagnosis not present

## 2016-06-01 DIAGNOSIS — E861 Hypovolemia: Secondary | ICD-10-CM | POA: Diagnosis present

## 2016-06-01 DIAGNOSIS — R2689 Other abnormalities of gait and mobility: Secondary | ICD-10-CM | POA: Diagnosis not present

## 2016-06-01 DIAGNOSIS — R7309 Other abnormal glucose: Secondary | ICD-10-CM | POA: Diagnosis not present

## 2016-06-01 DIAGNOSIS — I69354 Hemiplegia and hemiparesis following cerebral infarction affecting left non-dominant side: Secondary | ICD-10-CM | POA: Diagnosis not present

## 2016-06-01 DIAGNOSIS — E162 Hypoglycemia, unspecified: Secondary | ICD-10-CM | POA: Diagnosis not present

## 2016-06-01 DIAGNOSIS — K219 Gastro-esophageal reflux disease without esophagitis: Secondary | ICD-10-CM | POA: Diagnosis present

## 2016-06-01 DIAGNOSIS — E1010 Type 1 diabetes mellitus with ketoacidosis without coma: Secondary | ICD-10-CM | POA: Diagnosis not present

## 2016-06-01 DIAGNOSIS — I639 Cerebral infarction, unspecified: Secondary | ICD-10-CM | POA: Diagnosis present

## 2016-06-01 DIAGNOSIS — E108 Type 1 diabetes mellitus with unspecified complications: Secondary | ICD-10-CM | POA: Diagnosis not present

## 2016-06-01 LAB — COMPREHENSIVE METABOLIC PANEL
ALT: 15 U/L (ref 14–54)
AST: 22 U/L (ref 15–41)
Albumin: 3.6 g/dL (ref 3.5–5.0)
Alkaline Phosphatase: 108 U/L (ref 38–126)
Anion gap: 29 — ABNORMAL HIGH (ref 5–15)
BILIRUBIN TOTAL: 1.8 mg/dL — AB (ref 0.3–1.2)
BUN: 20 mg/dL (ref 6–20)
CALCIUM: 8.7 mg/dL — AB (ref 8.9–10.3)
CO2: 7 mmol/L — AB (ref 22–32)
CREATININE: 1.14 mg/dL — AB (ref 0.44–1.00)
Chloride: 88 mmol/L — ABNORMAL LOW (ref 101–111)
GFR, EST NON AFRICAN AMERICAN: 53 mL/min — AB (ref >60)
Glucose, Bld: 963 mg/dL (ref 65–99)
Potassium: 5.8 mmol/L — ABNORMAL HIGH (ref 3.5–5.1)
SODIUM: 124 mmol/L — AB (ref 135–145)
Total Protein: 6.8 g/dL (ref 6.5–8.1)

## 2016-06-01 LAB — BLOOD GAS, VENOUS
ACID-BASE DEFICIT: 20 mmol/L — AB (ref 0.0–2.0)
Bicarbonate: 6.5 mEq/L — ABNORMAL LOW (ref 20.0–24.0)
O2 Saturation: 95.6 %
PCO2 VEN: 17 mmHg — AB (ref 45.0–50.0)
PO2 VEN: 104 mmHg — AB (ref 31.0–45.0)
Patient temperature: 98.6
TCO2: 6.3 mmol/L (ref 0–100)
pH, Ven: 7.209 — ABNORMAL LOW (ref 7.250–7.300)

## 2016-06-01 LAB — CBC WITH DIFFERENTIAL/PLATELET
BASOS PCT: 0 %
Basophils Absolute: 0 10*3/uL (ref 0.0–0.1)
EOS ABS: 0 10*3/uL (ref 0.0–0.7)
Eosinophils Relative: 0 %
HCT: 33.8 % — ABNORMAL LOW (ref 36.0–46.0)
HEMOGLOBIN: 10.3 g/dL — AB (ref 12.0–15.0)
LYMPHS ABS: 1.4 10*3/uL (ref 0.7–4.0)
Lymphocytes Relative: 10 %
MCH: 28.5 pg (ref 26.0–34.0)
MCHC: 30.5 g/dL (ref 30.0–36.0)
MCV: 93.6 fL (ref 78.0–100.0)
MONO ABS: 0.3 10*3/uL (ref 0.1–1.0)
Monocytes Relative: 2 %
NEUTROS PCT: 88 %
Neutro Abs: 12.7 10*3/uL — ABNORMAL HIGH (ref 1.7–7.7)
PLATELETS: 380 10*3/uL (ref 150–400)
RBC: 3.61 MIL/uL — ABNORMAL LOW (ref 3.87–5.11)
RDW: 17.3 % — AB (ref 11.5–15.5)
WBC: 14.4 10*3/uL — ABNORMAL HIGH (ref 4.0–10.5)

## 2016-06-01 LAB — BASIC METABOLIC PANEL
Anion gap: 14 (ref 5–15)
BUN: 15 mg/dL (ref 6–20)
CO2: 16 mmol/L — ABNORMAL LOW (ref 22–32)
Calcium: 8.5 mg/dL — ABNORMAL LOW (ref 8.9–10.3)
Chloride: 103 mmol/L (ref 101–111)
Creatinine, Ser: 0.97 mg/dL (ref 0.44–1.00)
GFR calc Af Amer: >60 mL/min (ref >60)
GFR calc non Af Amer: >60 mL/min (ref >60)
Glucose, Bld: 214 mg/dL — ABNORMAL HIGH (ref 65–99)
Potassium: 3.8 mmol/L (ref 3.5–5.1)
Sodium: 133 mmol/L — ABNORMAL LOW (ref 135–145)

## 2016-06-01 LAB — URINALYSIS, ROUTINE W REFLEX MICROSCOPIC
BILIRUBIN URINE: NEGATIVE
HGB URINE DIPSTICK: NEGATIVE
Ketones, ur: >80 mg/dL — AB
Leukocytes, UA: NEGATIVE
NITRITE: NEGATIVE
PROTEIN: NEGATIVE mg/dL
SPECIFIC GRAVITY, URINE: 1.022 (ref 1.005–1.030)
pH: 5.5 (ref 5.0–8.0)

## 2016-06-01 LAB — GLUCOSE, CAPILLARY
GLUCOSE-CAPILLARY: 90 mg/dL (ref 65–99)
Glucose-Capillary: 110 mg/dL — ABNORMAL HIGH (ref 65–99)
Glucose-Capillary: 111 mg/dL — ABNORMAL HIGH (ref 65–99)
Glucose-Capillary: 148 mg/dL — ABNORMAL HIGH (ref 65–99)
Glucose-Capillary: 220 mg/dL — ABNORMAL HIGH (ref 65–99)

## 2016-06-01 LAB — CBG MONITORING, ED

## 2016-06-01 LAB — URINE MICROSCOPIC-ADD ON
BACTERIA UA: NONE SEEN
RBC / HPF: NONE SEEN RBC/hpf (ref 0–5)

## 2016-06-01 LAB — PROTIME-INR
INR: 1.31 (ref 0.00–1.49)
Prothrombin Time: 15.9 seconds — ABNORMAL HIGH (ref 11.6–15.2)

## 2016-06-01 LAB — I-STAT TROPONIN, ED: Troponin i, poc: 0 ng/mL (ref 0.00–0.08)

## 2016-06-01 MED ORDER — ONDANSETRON 8 MG PO TBDP
8.0000 mg | ORAL_TABLET | Freq: Once | ORAL | Status: AC
  Administered 2016-06-01: 8 mg via ORAL
  Filled 2016-06-01: qty 1

## 2016-06-01 MED ORDER — SODIUM CHLORIDE 0.9 % IV SOLN
INTRAVENOUS | Status: DC
  Administered 2016-06-01: 5.4 [IU]/h via INTRAVENOUS
  Filled 2016-06-01: qty 2.5

## 2016-06-01 MED ORDER — SODIUM CHLORIDE 0.9 % IV SOLN: INTRAVENOUS | Status: DC

## 2016-06-01 MED ORDER — WARFARIN SODIUM 5 MG PO TABS
10.0000 mg | ORAL_TABLET | Freq: Once | ORAL | Status: AC
  Administered 2016-06-01: 10 mg via ORAL
  Filled 2016-06-01: qty 2

## 2016-06-01 MED ORDER — ENOXAPARIN SODIUM 60 MG/0.6ML ~~LOC~~ SOLN
1.0000 mg/kg | Freq: Two times a day (BID) | SUBCUTANEOUS | Status: DC
  Administered 2016-06-01 – 2016-06-03 (×5): 50 mg via SUBCUTANEOUS
  Filled 2016-06-01 (×6): qty 0.6

## 2016-06-01 MED ORDER — SODIUM CHLORIDE 0.9 % IV BOLUS (SEPSIS)
1000.0000 mL | Freq: Once | INTRAVENOUS | Status: AC
  Administered 2016-06-01: 1000 mL via INTRAVENOUS

## 2016-06-01 MED ORDER — CITALOPRAM HYDROBROMIDE 20 MG PO TABS
40.0000 mg | ORAL_TABLET | Freq: Every day | ORAL | Status: DC
  Administered 2016-06-02 – 2016-06-04 (×3): 40 mg via ORAL
  Filled 2016-06-01 (×3): qty 2

## 2016-06-01 MED ORDER — DEXTROSE-NACL 5-0.45 % IV SOLN
INTRAVENOUS | Status: DC
  Administered 2016-06-01: 20:00:00 via INTRAVENOUS

## 2016-06-01 MED ORDER — DEXTROSE-NACL 5-0.45 % IV SOLN: INTRAVENOUS | Status: DC

## 2016-06-01 MED ORDER — WARFARIN - PHARMACIST DOSING INPATIENT: Freq: Every day | Status: DC

## 2016-06-01 MED ORDER — SODIUM CHLORIDE 0.9 % IV SOLN: INTRAVENOUS | Status: AC

## 2016-06-01 NOTE — ED Notes (Signed)
Bed: WA13 Expected date:  Expected time:  Means of arrival:  Comments: EMS-hyperglycemia 

## 2016-06-01 NOTE — H&P (Addendum)
History and Physical  Madison [name] DPO:[id number] DOB: 31-Aug-1961 DOA: 06/01/2016  Referring physician: EDP PCP: Dorothyann Peng, NP   Chief Complaint: recurrent DKA  HPI: Madison [name] is a 55 y.o. 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 UA and CXR obtained from the ED unremarkable, but her blood sugar on arrival is 963  With elevated anion gap of 29, cr elevated from baseline at 1.14. She is started on insulin drip, hydration, hospitalist called to admit the patient.  Per chart review this her 4th admission with DKA this months, she report she get insulin shot at SNF without problem.  Review of Systems:  Detail per HPI, Review of systems are otherwise negative  Past Medical History  Diagnosis Date  . CAD (coronary artery disease)   . Obesity   . Hypercholesteremia   . HTN (hypertension)   . Dyslipidemia   . Aortic stenosis   . Thrombophlebitis   . Heart murmur   . Myocardial infarction ([place]) 12/2014  . Pneumonia 11/2014; 12/2014  . GERD (gastroesophageal reflux disease)   . Stroke syndrome [facility]) 1995; 2001    "when my son was born; problems w/speech and L hand since then" (01/19/2015)  . Anxiety   . Depression   . Allergy   . CHF (congestive heart failure) ([place])   . Rheumatoid arthritis(714.0)     "hands" (07/01/2015)  . Diabetes ([place]) dx'd 1982    "went straight to insulin"   Past Surgical History  Procedure Laterality Date  . Cesarean section  1995  . Foot fracture surgery    . Knee arthroscopy Right   . Neuroplasty / transposition median nerve at carpal tunnel    . Left heart catheterization with coronary  angiogram N/A 12/08/2014    Procedure: LEFT HEART CATHETERIZATION WITH CORONARY ANGIOGRAM;  Surgeon: Wellington Hampshire, MD;  Location: [place] CATH LAB;  Service: Cardiovascular;  Laterality: N/A;  . Percutaneous coronary stent intervention (pci-s) N/A 12/09/2014    Procedure: PERCUTANEOUS CORONARY STENT INTERVENTION (PCI-S);  Surgeon: Leonie Man, MD;  Location: [facility] CATH LAB;  Service: Cardiovascular;  Laterality: N/A;  . Cardiac catheterization  12/08/2014  . Coronary angioplasty with stent placement  12/09/2014  . Aortic valve replacement (avr)/coronary artery bypass grafting (cabg)  "2014"    Archie Endo 03/08/2014  . Fracture surgery    . Tubal ligation  1995  . Cataract extraction Left   . Coronary artery bypass graft    . Orif ankle fracture Left 07/04/2015    Procedure: OPEN REDUCTION INTERNAL FIXATION (ORIF)  TRIMAL ANKLE FRACTURE;  Surgeon: Leandrew Koyanagi, MD;  Location: [place];  Service: Orthopedics;  Laterality: Left;   Social History:  reports that she has never smoked. She has never used smokeless tobacco. She reports that she does not drink alcohol or use illicit drugs. Patient lives at [facility] & is able to participate in activities of daily living independently , ambulatory with a walker   Allergies  Allergen Reactions  . Adhesive [Tape] Other (See Comments)    Can burn skin if left on too long  . Celebrex [Celecoxib]  Rash  . Detrol [Tolterodine] Hives    Family History  Problem Relation Age of Onset  . Stroke    . Heart disease Mother   . Heart disease Sister       Prior to Admission medications   Medication Sig Start Date End Date Taking? Authorizing Provider  citalopram (CELEXA) 40 MG tablet Take 1 tablet (40 mg total) by mouth daily. 03/08/16  Yes Dorothyann Peng, NP  furosemide (LASIX) 20 MG tablet Take 1 tablet (20 mg total) by mouth daily. 05/22/16  Yes Domenic Polite, MD  insulin aspart (NOVOLOG FLEXPEN) 100 UNIT/ML FlexPen Inject 0-9 Units into the skin 3 (three) times daily with  meals. CBG < 70: eat or drink something and recheck, CBG 70 - 120: 0 units CBG 121 - 150: 2 unit CBG 151 - 200: 3 units CBG 201 - 250: 5 units CBG 251 - 300:  6units CBG 301 - 350:  7units CBG 351 - 400: 10 units CBG > 400: call MD. Patient taking differently: Inject 2 Units into the skin 3 (three) times daily with meals.  04/30/16  Yes Dorothyann Peng, NP  insulin detemir (LEVEMIR) 100 UNIT/ML injection Inject 0.08 mLs (8 Units total) into the skin daily. 05/27/16  Yes Velvet Bathe, MD  omeprazole (PRILOSEC) 40 MG capsule TAKE ONE CAPSULE BY MOUTH EVERY DAY 03/20/16  Yes Dorothyann Peng, NP  ondansetron (ZOFRAN) 4 MG tablet Take 4 mg by mouth every 4 (four) hours as needed for nausea or vomiting. Reported on 05/16/2016   Yes Historical Provider, MD  pravastatin (PRAVACHOL) 20 MG tablet Take 20 mg by mouth daily. 03/08/16  Yes Historical Provider, MD  warfarin (COUMADIN) 3 MG tablet Take 3 mg by mouth daily.   Yes Historical Provider, MD  Blood Glucose Monitoring Suppl ([place]) w/Device KIT Use to check glucose 2x per day 03/16/16   Dorothyann Peng, NP  glucose blood test strip Use as instructed Patient taking differently: 1 each by Other route as needed (as instructed). Use as instructed 03/16/16   Dorothyann Peng, NP    Physical Exam: BP 98/44 mmHg  Pulse 119  Temp(Src) 98.3 F (36.8 C) (Oral)  Resp 28  SpO2 97%  General:  NAD Eyes: PERRL ENT: dry oral mucosa Neck: supple, no JVD Cardiovascular: sinus tachycardia , midline surgical scar Respiratory: CTABL Abdomen: soft/ND/ND, positive bowel sounds Skin: no rash Musculoskeletal:  No edema Psychiatric: calm/cooperative Neurologic: aaox3, dysarthria and left sided weakness and left arm contracture from old cva.          Labs on Admission:  Basic Metabolic Panel:  Recent Labs Lab 05/26/16 0121 05/26/16 0400 05/26/16 0917 05/27/16 1310 06/01/16 1138  NA 137 139 138 130* 124*  K 3.7 3.8 4.3 4.4 5.8*  CL 106 108 107  101 88*  CO2 18* 25 18* 15* 7*  GLUCOSE 243* 115* 169* 408* 963*  BUN 24* 21* _0 CREATININE 1.05* 0.84 0.68 0.69 1.14*  CALCIUM 8.7* 8.7* 8.4* 8.4* 8.7*   Liver Function Tests:  Recent Labs Lab 05/25/16 1805 06/01/16 1138  AST 30 22  ALT 18 15  ALKPHOS 118 108  BILITOT 2.1* 1.8*  PROT 7.0 6.8  ALBUMIN 3.9 3.6    Recent Labs Lab 05/25/16 1805  LIPASE 32   No results for input(s): AMMONIA in the last 168 hours. CBC:  Recent Labs Lab 05/25/16 1805 06/01/16 1138  WBC 13.3* 14.4*  NEUTROABS 11.4* 12.7*  HGB 10.5* 10.3*  HCT 35.7* 33.8*  MCV 98.6 93.6  PLT 445* 380   Cardiac Enzymes:  Recent Labs Lab 05/25/16 2212 05/26/16 0400 05/26/16 0917  TROPONINI <0.03 0.04* 0.06*    BNP (last 3 results)  Recent Labs  03/17/16 1801 03/27/16 0832 05/25/16 1805  BNP 95.3 73.7 86.2    ProBNP (last 3 results) No results for input(s): PROBNP in the last 8760 hours.  CBG:  Recent Labs Lab 05/26/16 2103 05/27/16 0759 05/27/16 1206 06/01/16 1039 06/01/16 1245  GLUCAP 70 262* 292* >600* >600*    Radiological Exams on Admission: Dg Chest 2 View  06/01/2016  CLINICAL DATA:  Hyperglycemia, vomiting this morning, increased weakness, prior LEFT side stroke EXAM: CHEST  2 VIEW COMPARISON:  05/25/2016 FINDINGS: Normal heart size post AVR. Mediastinal contours and pulmonary vascularity normal. Lungs clear. No pleural effusion or pneumothorax. Bones unremarkable. IMPRESSION: Post AVR. No acute abnormalities. Electronically Signed   By: Lavonia Dana M.D.   On: 06/01/2016 11:39    EKG: Independently reviewed. Sinus tachycardia, no other acute findings  Assessment/Plan Present on Admission:  **None**  Recurrent DKA: unclear inciting event, not sure if patient is compliant with meds  Though she is from SNF. Stepdown admission on insulin drip/ivf, DKA protocol utilized.  Arf: prerenal , expect improvement with hydration, ua no infection.  Hyponatremia: likely  due to dehydration and pseudohyponatremia, expect improvement with insulin drip and ivf  Mild Hyperkalemia: no acute ekg changes, likely from insulin deficient states, expect improvement with insulin drip.  H/o aortic stenosis s/p aortic valve placement, on coumadin, INR subtherapeutic, will use lovenox bridging and coumadin per pharmacy     DVT prophylaxis: on coumadin  Consultants: none  Code Status: full   Family Communication:  Patient   Disposition Plan: admit to stepdown  Time spent: 37mns  Quamere Mussell MD, PhD [facility] Pager [phone] 7PM-7AM, please contact night-coverage at [email or web address], password [facility]

## 2016-06-01 NOTE — ED Notes (Signed)
Staff at [facility], (where pt. Resides) found pt. To be hyperglycemic today; recalcitrant to subq insulin.  She arrives here oriented x 4 with slightly slurred speech and left-sided weakness r/t prior to old CVA.  She states she has not had any recent/current illness.  She vomits upon arrival to E.D.

## 2016-06-01 NOTE — ED Provider Notes (Signed)
CSN: [id number]     Arrival date & time 06/01/16  1028 History   First MD Initiated Contact with Patient 06/01/16 1043     Chief Complaint  Patient presents with  . Hyperglycemia     Patient is a 55 y.o. female presenting with hyperglycemia. The history is provided by the patient. No language interpreter was used.  Hyperglycemia  Madison [name] is a 55 y.o. female who presents to the Emergency Department complaining of elevated blood sugar.  She presents from [name] for evaluation of hyperglycemia. She has been assisted living since she left the hospital on July 9 for DKA. She reports feeling in her routine state of health until this morning when she had a blood sugar that read high. She states she is thirsty but otherwise feels well. She did have one episode of emesis prior to ED arrival. She denies any chest pain, shortness of breath, abdominal pain, diarrhea, dysuria.  Past Medical History  Diagnosis Date  . CAD (coronary artery disease)   . Obesity   . Hypercholesteremia   . HTN (hypertension)   . Dyslipidemia   . Aortic stenosis   . Thrombophlebitis   . Heart murmur   . Myocardial infarction ([place]) 12/2014  . Pneumonia 11/2014; 12/2014  . GERD (gastroesophageal reflux disease)   . Stroke syndrome [facility]) 1995; 2001    "when my son was born; problems w/speech and L hand since then" (01/19/2015)  . Anxiety   . Depression   . Allergy   . CHF (congestive heart failure) ([place])   . Rheumatoid arthritis(714.0)     "hands" (07/01/2015)  . Diabetes ([place]) dx'd 1982    "went straight to insulin"   Past Surgical History  Procedure Laterality Date  . Cesarean section  1995  . Foot fracture surgery    . Knee arthroscopy Right   . Neuroplasty / transposition median nerve at carpal tunnel    . Left heart catheterization with coronary angiogram N/A 12/08/2014    Procedure: LEFT HEART CATHETERIZATION WITH CORONARY ANGIOGRAM;  Surgeon: Wellington Hampshire, MD;  Location: [place] CATH LAB;  Service:  Cardiovascular;  Laterality: N/A;  . Percutaneous coronary stent intervention (pci-s) N/A 12/09/2014    Procedure: PERCUTANEOUS CORONARY STENT INTERVENTION (PCI-S);  Surgeon: Leonie Man, MD;  Location[facility] C[facility] LAB;  Service: Cardiovascular;  Laterality: N/A;  . Cardiac catheterization  12/08/2014  . Coronary angioplasty with stent placement  12/09/2014  . Aortic valve replacement (avr)/coronary artery bypass grafting (cabg)  "2014"    Archie Endo 03/08/2014  . Fracture surgery    . Tubal ligation  1995  . Cataract extraction Left   . Coronary artery bypass graft    . Orif ankle fracture Left 07/04/2015    Procedure: OPEN REDUCTION INTERNAL FIXATION (ORIF)  TRIMAL ANKLE FRACTURE;  Surgeon: Leandrew Koyanagi, MD;  Location: [place];  Service: Orthopedics;  Laterality: Left;   Family History  Problem Relation Age of Onset  . Stroke    . Heart disease Mother   . Heart disease Sister    Social History  Substance Use Topics  . Smoking status: Never Smoker   . Smokeless tobacco: Never Used  . Alcohol Use: No   OB History    No data available     Review of Systems  All other systems reviewed and are negative.     Allergies  Adhesive; Celebrex; and Detrol  Home Medications   Prior to Admission medications   Medication Sig Start Date  End Date Taking? Authorizing Provider  citalopram (CELEXA) 40 MG tablet Take 1 tablet (40 mg total) by mouth daily. 03/08/16  Yes Dorothyann Peng, NP  furosemide (LASIX) 20 MG tablet Take 1 tablet (20 mg total) by mouth daily. 05/22/16  Yes Domenic Polite, MD  insulin aspart (NOVOLOG FLEXPEN) 100 UNIT/ML FlexPen Inject 0-9 Units into the skin 3 (three) times daily with meals. CBG < 70: eat or drink something and recheck, CBG 70 - 120: 0 units CBG 121 - 150: 2 unit CBG 151 - 200: 3 units CBG 201 - 250: 5 units CBG 251 - 300:  6units CBG 301 - 350:  7units CBG 351 - 400: 10 units CBG > 400: call MD. Patient taking differently: Inject 2 Units into the skin 3  (three) times daily with meals.  04/30/16  Yes Dorothyann Peng, NP  insulin detemir (LEVEMIR) 100 UNIT/ML injection Inject 0.08 mLs (8 Units total) into the skin daily. 05/27/16  Yes Velvet Bathe, MD  omeprazole (PRILOSEC) 40 MG capsule TAKE ONE CAPSULE BY MOUTH EVERY DAY 03/20/16  Yes Dorothyann Peng, NP  ondansetron (ZOFRAN) 4 MG tablet Take 4 mg by mouth every 4 (four) hours as needed for nausea or vomiting. Reported on 05/16/2016   Yes Historical Provider, MD  pravastatin (PRAVACHOL) 20 MG tablet Take 20 mg by mouth daily. 03/08/16  Yes Historical Provider, MD  warfarin (COUMADIN) 3 MG tablet Take 3 mg by mouth daily.   Yes Historical Provider, MD  Blood Glucose Monitoring Suppl ([place]) w/Device KIT Use to check glucose 2x per day 03/16/16   Dorothyann Peng, NP  glucose blood test strip Use as instructed Patient taking differently: 1 each by Other route as needed (as instructed). Use as instructed 03/16/16   Dorothyann Peng, NP   BP 115/60 mmHg  Pulse 122  Temp(Src) 98.3 F (36.8 C) (Oral)  Resp 18  SpO2 100% Physical Exam  Constitutional: She is oriented to person, place, and time. She appears well-developed and well-nourished.  HENT:  Head: Normocephalic and atraumatic.  Dry mucous membranes  Cardiovascular: Regular rhythm.   Tachycardic, SEM  Pulmonary/Chest: Effort normal and breath sounds normal. No respiratory distress.  Abdominal: Soft. There is no tenderness. There is no rebound and no guarding.  Musculoskeletal: She exhibits no edema or tenderness.  Neurological: She is alert and oriented to person, place, and time.  Slightly dysarthric speech  Skin: Skin is warm and dry.  Psychiatric: She has a normal mood and affect. Her behavior is normal.  Nursing note and vitals reviewed.   ED Course  Procedures (including critical care time)  CRITICAL CARE Performed by: Quintella Reichert   Total critical care time: 35 minutes  Critical care time was exclusive of separately  billable procedures and treating other patients.  Critical care was necessary to treat or prevent imminent or life-threatening deterioration.  Critical care was time spent personally by me on the following activities: development of treatment plan with patient and/or surrogate as well as nursing, discussions with consultants, evaluation of patient's response to treatment, examination of patient, obtaining history from patient or surrogate, ordering and performing treatments and interventions, ordering and review of laboratory studies, ordering and review of radiographic studies, pulse oximetry and re-evaluation of patient's condition.   Labs Review Labs Reviewed  COMPREHENSIVE METABOLIC PANEL - Abnormal; Notable for the following:    Sodium 124 (*)    Potassium 5.8 (*)    Chloride 88 (*)    CO2 7 (*)  Glucose, Bld 963 (*)    Creatinine, Ser 1.14 (*)    Calcium 8.7 (*)    Total Bilirubin 1.8 (*)    GFR calc non Af Amer 53 (*)    Anion gap 29 (*)    All other components within normal limits  CBC WITH DIFFERENTIAL/PLATELET - Abnormal; Notable for the following:    WBC 14.4 (*)    RBC 3.61 (*)    Hemoglobin 10.3 (*)    HCT 33.8 (*)    RDW 17.3 (*)    Neutro Abs 12.7 (*)    All other components within normal limits  URINALYSIS, ROUTINE W REFLEX MICROSCOPIC (NOT AT [facility]) - Abnormal; Notable for the following:    Glucose, UA >1000 (*)    Ketones, ur >80 (*)    All other components within normal limits  BLOOD GAS, VENOUS - Abnormal; Notable for the following:    pH, Ven 7.209 (*)    pCO2, Ven 17.0 (*)    pO2, Ven 104.0 (*)    Bicarbonate 6.5 (*)    Acid-base deficit 20.0 (*)    All other components within normal limits  PROTIME-INR - Abnormal; Notable for the following:    Prothrombin Time 15.9 (*)    All other components within normal limits  URINE MICROSCOPIC-ADD ON - Abnormal; Notable for the following:    Squamous Epithelial / LPF 0-5 (*)    All other components within  normal limits  CBG MONITORING, ED - Abnormal; Notable for the following:    Glucose-Capillary >600 (*)    All other components within normal limits  CBG MONITORING, ED - Abnormal; Notable for the following:    Glucose-Capillary >600 (*)    All other components within normal limits  CBG MONITORING, ED - Abnormal; Notable for the following:    Glucose-Capillary >600 (*)    All other components within normal limits  HEMOGLOBIN A1C  I-STAT TROPOININ, ED    Imaging Review Dg Chest 2 View  06/01/2016  CLINICAL DATA:  Hyperglycemia, vomiting this morning, increased weakness, prior LEFT side stroke EXAM: CHEST  2 VIEW COMPARISON:  05/25/2016 FINDINGS: Normal heart size post AVR. Mediastinal contours and pulmonary vascularity normal. Lungs clear. No pleural effusion or pneumothorax. Bones unremarkable. IMPRESSION: Post AVR. No acute abnormalities. Electronically Signed   By: Lavonia Dana M.D.   On: 06/01/2016 11:39   I have personally reviewed and evaluated these images and lab results as part of my medical decision-making.   EKG Interpretation   Date/Time:  Friday June 01 2016 10:45:38 EDT Ventricular Rate:  123 PR Interval:    QRS Duration: 82 QT Interval:  326 QTC Calculation: 467 R Axis:   -90 Text Interpretation:  Sinus tachycardia Left anterior fascicular block  Probable anteroseptal infarct, old Confirmed by Hazle Coca [phone]) on  06/01/2016 10:50:40 AM Also confirmed by Hazle Coca [phone]), editor Yehuda Mao [phone])  on 06/01/2016 11:01:18 AM      MDM   Final diagnoses:  Diabetic ketoacidosis without coma associated with type 1 diabetes mellitus ([place])    Patient with history of diabetes here with hyperglycemia, vomiting. Labs and presentation are consistent with DKA. She was given IV fluid resuscitation and started on an insulin drip. No evidence of acute infectious process at this time. Plan to admit to the hospitalist service for further treatment. Pt states she is  compliant with her insulin and it is unclear why she is having recurrent episodes of DKA at this time.  Quintella Reichert, MD 06/01/16 1733

## 2016-06-01 NOTE — Progress Notes (Signed)
Patient noted to have been admitted to the hospital 12 times within the last six months.  Last three admissions with diagnosis of DKA.  Patient presently being admitted for DKA.  Patient currently residing at [facility] nursing facility.  EDCM spoke to patient at bedside.  Patient confirms she has been receiving her insulin.  She reports she has not been eating anything she is not supposed to.  Per chart review, prior to residing at [facility], patient lived alone and was noncompliant with her medications.  Patient with slurred speech, history of stroke.

## 2016-06-01 NOTE — Progress Notes (Signed)
ANTICOAGULATION CONSULT NOTE - Initial Consult  Pharmacy Consult for Warfarin/Lovenox Indication: mechanical aortic valve  Allergies  Allergen Reactions  . Adhesive [Tape] Other (See Comments)    Can burn skin if left on too long  . Celebrex [Celecoxib] Rash  . Detrol [Tolterodine] Hives    Patient Measurements: Height: 5\' 3"  (160 cm) Weight: 114 lb 13.8 oz (52.1 kg) IBW/kg (Calculated) : 52.4   Vital Signs: Temp: 98.2 F (36.8 C) (07/14 1703) Temp Source: Oral (07/14 1703) BP: 106/38 mmHg (07/14 1703) Pulse Rate: 121 (07/14 1703)  Labs:  Recent Labs  06/01/16 1138  HGB 10.3*  HCT 33.8*  PLT 380  LABPROT 15.9*  INR 1.31  CREATININE 1.14*    Estimated Creatinine Clearance: 45.9 mL/min (by C-G formula based on Cr of 1.14).   Medical History: Past Medical History  Diagnosis Date  . CAD (coronary artery disease)   . Obesity   . Hypercholesteremia   . HTN (hypertension)   . Dyslipidemia   . Aortic stenosis   . Thrombophlebitis   . Heart murmur   . Myocardial infarction ([facility]) 12/2014  . Pneumonia 11/2014; 12/2014  . GERD (gastroesophageal reflux disease)   . Stroke syndrome [facility]) 1995; 2001    "when my son was born; problems w/speech and L hand since then" (01/19/2015)  . Anxiety   . Depression   . Allergy   . CHF (congestive heart failure) ([facility])   . Rheumatoid arthritis(714.0)     "hands" (07/01/2015)  . Diabetes ([facility]) dx'd 1982    "went straight to insulin"    Assessment: 55 YOF readmitted with DKA.Has had several admissions for DKA this year.  She is on warfarin for mechanical aortic valve.   Patient's warfarin regimen has been changing on [facility] MAR between each recent admission.  Per anti-coag visit in 04/2016, their recommendation was 7.5 mg daily.  During the most recent patient admissions, patient was receiving doses of 5-11 mg daily.  Most recent NH MAR documents 3 mg daily. Last dose documented as 7/13 at 1700.  Today's INR subtherapeutic at 1.31.   Bridging with Lovenox until therapeutic.  Hgb at baseline. Plts WNL.  Goal of Therapy:  INR 2.5-3.5   Plan:  Warfarin 10 mg tonight. Lovenox 50 mg (1 mg/kg) SQ q12h until INR therapeutic. Daily INR and q72h CBC.  8/13 06/01/2016,7:07 PM

## 2016-06-02 DIAGNOSIS — A419 Sepsis, unspecified organism: Principal | ICD-10-CM

## 2016-06-02 DIAGNOSIS — R6521 Severe sepsis with septic shock: Secondary | ICD-10-CM

## 2016-06-02 LAB — CBC
HCT: 27.1 % — ABNORMAL LOW (ref 36.0–46.0)
Hemoglobin: 9.1 g/dL — ABNORMAL LOW (ref 12.0–15.0)
MCH: 29 pg (ref 26.0–34.0)
MCHC: 33.6 g/dL (ref 30.0–36.0)
MCV: 86.3 fL (ref 78.0–100.0)
Platelets: 313 K/uL (ref 150–400)
RBC: 3.14 MIL/uL — ABNORMAL LOW (ref 3.87–5.11)
RDW: 16.3 % — ABNORMAL HIGH (ref 11.5–15.5)
WBC: 15.2 K/uL — ABNORMAL HIGH (ref 4.0–10.5)

## 2016-06-02 LAB — BASIC METABOLIC PANEL WITH GFR
Anion gap: 10 (ref 5–15)
Anion gap: 6 (ref 5–15)
BUN: 12 mg/dL (ref 6–20)
BUN: 9 mg/dL (ref 6–20)
CO2: 21 mmol/L — ABNORMAL LOW (ref 22–32)
CO2: 22 mmol/L (ref 22–32)
Calcium: 8.7 mg/dL — ABNORMAL LOW (ref 8.9–10.3)
Calcium: 8.4 mg/dL — ABNORMAL LOW (ref 8.9–10.3)
Chloride: 103 mmol/L (ref 101–111)
Chloride: 106 mmol/L (ref 101–111)
Creatinine, Ser: 0.7 mg/dL (ref 0.44–1.00)
Creatinine, Ser: 0.74 mg/dL (ref 0.44–1.00)
GFR calc Af Amer: >60 mL/min (ref >60)
GFR calc Af Amer: >60 mL/min (ref >60)
GFR calc non Af Amer: >60 mL/min (ref >60)
GFR calc non Af Amer: >60 mL/min (ref >60)
Glucose, Bld: 86 mg/dL (ref 65–99)
Glucose, Bld: 82 mg/dL (ref 65–99)
Potassium: 3.8 mmol/L (ref 3.5–5.1)
Potassium: 4.1 mmol/L (ref 3.5–5.1)
Sodium: 134 mmol/L — ABNORMAL LOW (ref 135–145)
Sodium: 134 mmol/L — ABNORMAL LOW (ref 135–145)

## 2016-06-02 LAB — BASIC METABOLIC PANEL
ANION GAP: 10 (ref 5–15)
ANION GAP: 8 (ref 5–15)
ANION GAP: 9 (ref 5–15)
BUN: 11 mg/dL (ref 6–20)
BUN: 8 mg/dL (ref 6–20)
BUN: 7 mg/dL (ref 6–20)
CALCIUM: 8.7 mg/dL — AB (ref 8.9–10.3)
CALCIUM: 8.5 mg/dL — AB (ref 8.9–10.3)
CHLORIDE: 105 mmol/L (ref 101–111)
CHLORIDE: 103 mmol/L (ref 101–111)
CO2: 20 mmol/L — AB (ref 22–32)
CO2: 22 mmol/L (ref 22–32)
CO2: 20 mmol/L — ABNORMAL LOW (ref 22–32)
CREATININE: 0.66 mg/dL (ref 0.44–1.00)
Calcium: 8.4 mg/dL — ABNORMAL LOW (ref 8.9–10.3)
Chloride: 105 mmol/L (ref 101–111)
Creatinine, Ser: 0.78 mg/dL (ref 0.44–1.00)
Creatinine, Ser: 0.84 mg/dL (ref 0.44–1.00)
GFR calc Af Amer: >60 mL/min (ref >60)
GFR calc non Af Amer: >60 mL/min (ref >60)
GFR calc non Af Amer: >60 mL/min (ref >60)
GLUCOSE: 146 mg/dL — AB (ref 65–99)
GLUCOSE: 99 mg/dL (ref 65–99)
Glucose, Bld: 102 mg/dL — ABNORMAL HIGH (ref 65–99)
POTASSIUM: 3.9 mmol/L (ref 3.5–5.1)
POTASSIUM: 4.4 mmol/L (ref 3.5–5.1)
Potassium: 4.2 mmol/L (ref 3.5–5.1)
Sodium: 135 mmol/L (ref 135–145)
Sodium: 133 mmol/L — ABNORMAL LOW (ref 135–145)
Sodium: 134 mmol/L — ABNORMAL LOW (ref 135–145)

## 2016-06-02 LAB — HEPATIC FUNCTION PANEL
ALK PHOS: 84 U/L (ref 38–126)
ALT: 12 U/L — AB (ref 14–54)
AST: 20 U/L (ref 15–41)
Albumin: 2.8 g/dL — ABNORMAL LOW (ref 3.5–5.0)
BILIRUBIN TOTAL: 0.9 mg/dL (ref 0.3–1.2)
Bilirubin, Direct: <0.1 mg/dL — ABNORMAL LOW (ref 0.1–0.5)
Total Protein: 5.4 g/dL — ABNORMAL LOW (ref 6.5–8.1)

## 2016-06-02 LAB — GLUCOSE, CAPILLARY
GLUCOSE-CAPILLARY: 120 mg/dL — AB (ref 65–99)
GLUCOSE-CAPILLARY: 159 mg/dL — AB (ref 65–99)
GLUCOSE-CAPILLARY: 161 mg/dL — AB (ref 65–99)
GLUCOSE-CAPILLARY: 174 mg/dL — AB (ref 65–99)
GLUCOSE-CAPILLARY: 60 mg/dL — AB (ref 65–99)
GLUCOSE-CAPILLARY: 98 mg/dL (ref 65–99)
GLUCOSE-CAPILLARY: 99 mg/dL (ref 65–99)
Glucose-Capillary: 124 mg/dL — ABNORMAL HIGH (ref 65–99)
Glucose-Capillary: 15 mg/dL — CL (ref 65–99)
Glucose-Capillary: 286 mg/dL — ABNORMAL HIGH (ref 65–99)
Glucose-Capillary: 212 mg/dL — ABNORMAL HIGH (ref 65–99)

## 2016-06-02 LAB — PROTIME-INR
INR: 1.29 (ref 0.00–1.49)
PROTHROMBIN TIME: 15.8 s — AB (ref 11.6–15.2)

## 2016-06-02 LAB — HEMOGLOBIN A1C
Hgb A1c MFr Bld: 8.6 % — ABNORMAL HIGH (ref 4.8–5.6)
Mean Plasma Glucose: 200 mg/dL

## 2016-06-02 LAB — CORTISOL: CORTISOL PLASMA: 6.1 ug/dL

## 2016-06-02 MED ORDER — WARFARIN SODIUM 5 MG PO TABS
10.0000 mg | ORAL_TABLET | Freq: Once | ORAL | Status: AC
  Administered 2016-06-02: 10 mg via ORAL
  Filled 2016-06-02: qty 2

## 2016-06-02 MED ORDER — INSULIN ASPART 100 UNIT/ML ~~LOC~~ SOLN: 0.0000 [IU] | SUBCUTANEOUS | Status: DC

## 2016-06-02 MED ORDER — INSULIN DETEMIR 100 UNIT/ML ~~LOC~~ SOLN
20.0000 [IU] | Freq: Once | SUBCUTANEOUS | Status: AC
  Administered 2016-06-02: 20 [IU] via SUBCUTANEOUS
  Filled 2016-06-02: qty 0.2

## 2016-06-02 MED ORDER — INSULIN DETEMIR 100 UNIT/ML ~~LOC~~ SOLN
20.0000 [IU] | Freq: Every day | SUBCUTANEOUS | Status: DC
  Administered 2016-06-03 – 2016-06-04 (×2): 20 [IU] via SUBCUTANEOUS
  Filled 2016-06-02 (×2): qty 0.2

## 2016-06-02 MED ORDER — DEXTROSE 50 % IV SOLN
1.0000 | Freq: Once | INTRAVENOUS | Status: AC
  Administered 2016-06-02: 50 mL via INTRAVENOUS

## 2016-06-02 MED ORDER — DEXTROSE 50 % IV SOLN
INTRAVENOUS | Status: AC
  Filled 2016-06-02: qty 50

## 2016-06-02 MED ORDER — INSULIN ASPART 100 UNIT/ML ~~LOC~~ SOLN
0.0000 [IU] | SUBCUTANEOUS | Status: DC
  Administered 2016-06-02: 6 [IU] via SUBCUTANEOUS
  Administered 2016-06-02 – 2016-06-03 (×2): 2 [IU] via SUBCUTANEOUS
  Administered 2016-06-03: 4 [IU] via SUBCUTANEOUS
  Administered 2016-06-03: 20 [IU] via SUBCUTANEOUS
  Administered 2016-06-03: 2 [IU] via SUBCUTANEOUS
  Administered 2016-06-04: 4 [IU] via SUBCUTANEOUS
  Administered 2016-06-04: 12 [IU] via SUBCUTANEOUS

## 2016-06-02 NOTE — Progress Notes (Signed)
Patient taken off the glucose stabilizer at 0400.

## 2016-06-02 NOTE — Progress Notes (Signed)
ANTICOAGULATION CONSULT NOTE - Follow Up Consult  Pharmacy Consult for lovenox and wafarin Indication: mechanical AVR  Allergies  Allergen Reactions  . Adhesive [Tape] Other (See Comments)    Can burn skin if left on too long  . Celebrex [Celecoxib] Rash  . Detrol [Tolterodine] Hives    Patient Measurements: Height: 5\' 3"  (160 cm) Weight: 114 lb 13.8 oz (52.1 kg) IBW/kg (Calculated) : 52.4 Heparin Dosing Weight:   Vital Signs: Temp: 97.9 F (36.6 C) (07/15 0800) Temp Source: Oral (07/15 0300) BP: 125/37 mmHg (07/15 0800) Pulse Rate: 102 (07/15 0800)  Labs:  Recent Labs  06/01/16 1138  06/01/16 2308 06/02/16 0305 06/02/16 0926  HGB 10.3*  --   --  9.1*  --   HCT 33.8*  --   --  27.1*  --   PLT 380  --   --  313  --   LABPROT 15.9*  --   --  15.8*  --   INR 1.31  --   --  1.29  --   CREATININE 1.14*  < > 0.70 0.78 0.74  < > = values in this interval not displayed.  Estimated Creatinine Clearance: 65.4 mL/min (by C-G formula based on Cr of 0.74).   Assessment: 36 YOF readmitted with DKA.Has had several admissions for DKA this year. She is on warfarin for mechanical aortic valve.   Patient's warfarin regimen has been changing on NH MAR between each recent admission. Per anti-coag visit in 04/2016, their recommendation was 7.5 mg daily. During the most recent patient admissions, patient was receiving doses of 5-11 mg daily. Most recent NH MAR documents 3 mg daily. Last dose documented as 7/13 at 1700.  Today, 06/02/2016: - INR sub-therapeutic at 1.29 - Hgb down slightly 9.1, plt wnl - no bleeding documented - scr 0.78 (crcl>30) - on liquid diet - no signif drug-drug intxns  Goal of Therapy:  INR 2.5-3.5 Anti-Xa level: 0.6-1    Plan:  - repeat warfarin 10 mg PO x1 today - continue lovenox 50 mg SQ q12h - monitor for s/s bleeding  Vylet Maffia P 06/02/2016,11:31 AM

## 2016-06-02 NOTE — Progress Notes (Signed)
Hypoglycemic Event  CBG: 15  Treatment: D50 IV 25 mL  Symptoms: None  Follow-up CBG: [id number]:[id number] CBG Result:60  Possible Reasons for Event: Unknown  Comments/MD notified:Dr. Dhungel on unit and made aware    Madison [name]

## 2016-06-02 NOTE — Progress Notes (Signed)
PROGRESS NOTE                                                                                                                                                                                                             Patient Demographics:    Madison [name], is a 55 y.o. female, DOB - [birth year], QZR:[id number]  Admit date - 06/01/2016   Admitting Physician Albertine Grates, MD  Outpatient Primary MD for the patient is [name], NP  LOS - 1  Outpatient Specialists:None  Chief Complaint  Patient presents with  . Hyperglycemia      This is her 14th hospitalization this year with similar presentation of DKA and septic/hypovolemic shock!!!   Brief Narrative   55 year old female with history of aortic stenosis status post aortic valve replacement on Coumadin, type 1 diabetes mellitus severely uncontrolled with MULTIPLE hospitalization for DKA with hypovolemic/septic shock who was recently discharged to skilled nursing facility was sent for vomiting and elevated blood sugar. In the ED she was found to be in severe DKA with anion gap of 29 and blood glucose of 963. She was admitted to stepdown unit on insulin drip and IV hydration.    Subjective:   Patient denies any symptoms. Insulin drip was discontinued early this morning after gap closed. Later in the morning her CBG was found to be 15 and given D50.   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 Patient used to live with a roommate and there was a concern that she is not adherent to her diet and insulin regimen. During her last hospitalization she was sent to skilled nursing facility. Patient's hospitalization includes DKA associated with septic shock (no infective etiology diagnosed). She has been intubated on a few occasions  as well.  anion gap now closed but has episodes of hypoglycemia. Received D50 this morning. I will hold off on her Lantus dose and monitor on sliding scale coverage.  check cortisol level and insulin antibodies. Check A1c. I will get in touch with endocrinology prior to her discharge to set up an appointment for next week.  i am concerned about patient 's recurrent hospitalization and declining overall health.  Septic shock Patient hypotensive, tachycardic and febrile on  presentation. WBC of 15 K. I suspect this is associated with severe DKA. Patient has had antibiotics on several occasions with similar hospitalization recently. I would not use antibiotics at this time. Blood pressure now stable and patient afebrile this morning.  History of aortic stenosis status post aortic valve replacement On Coumadin. INR subtherapeutic. On Lovenox bridge.    Code Status : Full code  Family communication: None at bedside  Disposition Plan  : Continue step down monitoring.  Barriers For Discharge : Active symptoms  Consults  :  None  Procedures  : None  DVT Prophylaxis  :  On Coumadin  Lab Results  Component Value Date   PLT 313 06/02/2016    Antibiotics  :    Anti-infectives    None        Objective:   Filed Vitals:   06/02/16 0400 06/02/16 0404 06/02/16 0600 06/02/16 0800  BP: 70/25 98/40 106/33 125/37  Pulse: 88 92 80 102  Temp:    97.9 F (36.6 C)  TempSrc:      Resp: 18 16 15 20   Height:      Weight:      SpO2: 98% 98% 99%     Wt Readings from Last 3 Encounters:  06/01/16 52.1 kg (114 lb 13.8 oz)  05/27/16 54.386 kg (119 lb 14.4 oz)  05/24/16 53.933 kg (118 lb 14.4 oz)     Intake/Output Summary (Last 24 hours) at 06/02/16 1128 Last data filed at 06/02/16 0300  Gross per 24 hour  Intake 2816.5 ml  Output      0 ml  Net 2816.5 ml     Physical Exam  Gen:Thin built female not in distress HEENT: no pallor, dry mucosa, supple neck Chest: clear b/l, no  added sounds CVS: N S1&S2, no murmurs, rubs or gallop GI: soft, NT, ND, BS+ Musculoskeletal: warm, no edema CNS: Alert and oriented    Data Review:    CBC  Recent Labs Lab 06/01/16 1138 06/02/16 0305  WBC 14.4* 15.2*  HGB 10.3* 9.1*  HCT 33.8* 27.1*  PLT 380 313  MCV 93.6 86.3  MCH 28.5 29.0  MCHC 30.5 33.6  RDW 17.3* 16.3*  LYMPHSABS 1.4  --   MONOABS 0.3  --   EOSABS 0.0  --   BASOSABS 0.0  --     Chemistries   Recent Labs Lab 06/01/16 1138 06/01/16 1922 06/01/16 2308 06/02/16 0305 06/02/16 0926  NA 124* 133* 134* 135 134*  K 5.8* 3.8 3.8 3.9 4.1  CL 88* 103 103 105 106  CO2 7* 16* 21* 20* 22  GLUCOSE 963* 214* 86 146* 82  BUN 20 15 12 11 9   CREATININE 1.14* 0.97 0.70 0.78 0.74  CALCIUM 8.7* 8.5* 8.7* 8.4* 8.4*  AST 22  --   --  20  --   ALT 15  --   --  12*  --   ALKPHOS 108  --   --  84  --   BILITOT 1.8*  --   --  0.9  --    ------------------------------------------------------------------------------------------------------------------ No results for input(s): CHOL, HDL, LDLCALC, TRIG, CHOLHDL, LDLDIRECT in the last 72 hours.  Lab Results  Component Value Date   HGBA1C 9.9* 01/28/2016   ------------------------------------------------------------------------------------------------------------------ No results for input(s): TSH, T4TOTAL, T3FREE, THYROIDAB in the last 72 hours.  Invalid input(s): FREET3 ------------------------------------------------------------------------------------------------------------------ No results for input(s): VITAMINB12, FOLATE, FERRITIN, TIBC, IRON, RETICCTPCT in the last 72 hours.  Coagulation profile  Recent Labs Lab  06/01/16 1138 06/02/16 0305  INR 1.31 1.29    No results for input(s): DDIMER in the last 72 hours.  Cardiac Enzymes No results for input(s): CKMB, TROPONINI, MYOGLOBIN in the last 168 hours.  Invalid input(s):  CK ------------------------------------------------------------------------------------------------------------------    Component Value Date/Time   BNP 86.2 05/25/2016 1805    Inpatient Medications  Scheduled Meds: . citalopram  40 mg Oral Daily  . enoxaparin (LOVENOX) injection  1 mg/kg Subcutaneous Q12H  . insulin aspart  0-24 Units Subcutaneous Q4H  . [START ON 06/03/2016] insulin detemir  20 Units Subcutaneous Daily  . Warfarin - Pharmacist Dosing Inpatient   Does not apply q1800   Continuous Infusions: . sodium chloride     PRN Meds:.  Micro Results Recent Results (from the past 240 hour(s))  MRSA PCR Screening     Status: None   Collection Time: 05/25/16 11:03 PM  Result Value Ref Range Status   MRSA by PCR NEGATIVE NEGATIVE Final    Comment:        The GeneXpert MRSA Assay (FDA approved for NASAL specimens only), is one component of a comprehensive MRSA colonization surveillance program. It is not intended to diagnose MRSA infection nor to guide or monitor treatment for MRSA infections.   Culture, blood (routine x 2)     Status: None   Collection Time: 05/26/16  1:20 AM  Result Value Ref Range Status   Specimen Description BLOOD RIGHT WRIST  Final   Special Requests IN PEDIATRIC BOTTLE 1CC  Final   Culture   Final    NO GROWTH 5 DAYS Performed at [facility]    Report Status 05/31/2016 FINAL  Final  Culture, blood (routine x 2)     Status: None   Collection Time: 05/26/16  1:25 AM  Result Value Ref Range Status   Specimen Description BLOOD RIGHT HAND  Final   Special Requests IN PEDIATRIC BOTTLE 1CC  Final   Culture   Final    NO GROWTH 5 DAYS Performed at [facility]    Report Status 05/31/2016 FINAL  Final    Radiology Reports Dg Chest 2 View  06/01/2016  CLINICAL DATA:  Hyperglycemia, vomiting this morning, increased weakness, prior LEFT side stroke EXAM: CHEST  2 VIEW COMPARISON:  05/25/2016 FINDINGS: Normal heart size post AVR.  Mediastinal contours and pulmonary vascularity normal. Lungs clear. No pleural effusion or pneumothorax. Bones unremarkable. IMPRESSION: Post AVR. No acute abnormalities. Electronically Signed   By: Ulyses Southward M.D.   On: 06/01/2016 11:39   Dg Chest 2 View  05/19/2016  CLINICAL DATA:  Followup right upper lobe infiltrate. EXAM: CHEST  2 VIEW COMPARISON:  05/17/2016 FINDINGS: Cardiac shadow is within normal limits. Postsurgical changes are again seen. The lungs are well aerated without focal infiltrate. Mild interstitial edema is seen. Small bilateral pleural with. IMPRESSION: Mild interstitial edema and small pleural effusions. Electronically Signed   By: Alcide Clever M.D.   On: 05/19/2016 10:19   Dg Chest Port 1 View  05/25/2016  CLINICAL DATA:  Altered mental status as well is hyperglycemia EXAM: PORTABLE CHEST 1 VIEW COMPARISON:  05/19/2016 FINDINGS: Sternotomy wires overlie normal cardiac silhouette. Normal pulmonary vasculature. No effusion, infiltrate, or pneumothorax. No acute osseous abnormality. IMPRESSION: No acute cardiopulmonary process. Electronically Signed   By: Genevive Bi M.D.   On: 05/25/2016 19:32   Dg Chest Port 1 View  05/17/2016  CLINICAL DATA:  Hyperglycemia and hypotension EXAM: PORTABLE CHEST 1 VIEW COMPARISON:  05/10/2016 FINDINGS: Mild airspace opacity in the right upper lobe may represent early infectious infiltrate or aspiration. The left lung is clear. There is no large effusion. The pulmonary vasculature is normal. IMPRESSION: Question early infectious infiltrate or aspiration in the right upper lobe. Electronically Signed   By: Ellery Plunk M.D.   On: 05/17/2016 03:36   Dg Chest Port 1 View  05/10/2016  CLINICAL DATA:  Diabetic ketoacidosis. EXAM: PORTABLE CHEST 1 VIEW COMPARISON:  05/02/2016 FINDINGS: Sternotomy wires are unchanged. Interval removal of nasogastric tube and endotracheal tube. Lungs are adequately inflated and otherwise clear. Cardiomediastinal  silhouette and remainder of the exam is unchanged. IMPRESSION: No active disease. Electronically Signed   By: Elberta Fortis M.D.   On: 05/10/2016 13:39    Time Spent in minutes  35   Eddie North M.D on 06/02/2016 at 11:28 AM  Between 7am to 7pm - Pager - [phone]  After 7pm go to [email or web address] - password [facility]  [facility] -  Office  [phone]

## 2016-06-02 NOTE — Progress Notes (Signed)
Pt with hypoglycemic event earlier in shift. MD on floor and made aware. D 50 given per protocol. Blood glucose up to 60. Pt tray arrived on unit and after eating breakfast, pt sugar came up to 99. Pt remained asymptomatic throughout events. VWilliams,rn.

## 2016-06-02 NOTE — Clinical Social Work Note (Signed)
Clinical Social Work Assessment  Patient Details  Name: Madison [name] MRN: [id number] Date of Birth: 11-Oct-1961  Date of referral:  06/02/16               Reason for consult:  Facility Placement, Discharge Planning                Permission sought to share information with:  Facility Art therapist granted to share information::  Yes, Verbal Permission Granted  Name::        Agency::     Relationship::     Contact Information:     Housing/Transportation Living arrangements for the past 2 months:  Apartment, [place] of Information:  Patient Patient Interpreter Needed:  None Criminal Activity/Legal Involvement Pertinent to Current Situation/Hospitalization:  No - Comment as needed Significant Relationships:  Significant Other, Adult Children Lives with:  Facility Resident Do you feel safe going back to the place where you live?  Yes Need for family participation in patient care:  Yes (Comment)  Care giving concerns:  No family at bedside. Pt has voiced no concerns at this time.   Social Worker assessment / plan:  Pt hospitalized on 06/01/16 with Diabetic ketoacidosis . Pt was recently hospitalized at [facility] from 05/25/16-05/27/16 with DKA and d/c to [name]. CSW met with pt to assist with d/c planning. Pt reports that she would like to return to [facility] at d/c. Clinicals have been sent to SNF for review. Pt will need PT ( when medically appropriate ) for the [facility] authorization process in order to return to SNF. CSW will assist with authorization process once PT recommendations are available. CSW will continue to follow to assist with d/c planning.  Employment status:  Unemployed Nurse, adult, Medicaid [place] PT Recommendations:  Not assessed at this time Information / Referral to community resources:  [place]  Patient/Family's Response to care:  Pt would like to return to  [name] at d/c.   Patient/Family's Understanding of and Emotional Response to Diagnosis, Current Treatment, and Prognosis: Pt met with MD this am. She is aware of her medical status. Pt reports that she is feeling a little better. Pt reports that she likes [name] and she is looking forward to returning.    Emotional Assessment Appearance:  Appears stated age Attitude/Demeanor/Rapport:  Other (cooperative) Affect (typically observed):  Calm, Appropriate, Pleasant Orientation:  Oriented to Self, Oriented to Place, Oriented to  Time, Oriented to Situation Alcohol / Substance use:  Not Applicable Psych involvement (Current and /or in the community):  No (Comment)  Discharge Needs  Concerns to be addressed:  Discharge Planning Concerns Readmission within the last 30 days:  Yes Current discharge risk:  None Barriers to Discharge:  No Barriers Identified   Luretha Rued, [place] 06/02/2016, 11:46 AM

## 2016-06-03 DIAGNOSIS — E1065 Type 1 diabetes mellitus with hyperglycemia: Secondary | ICD-10-CM

## 2016-06-03 LAB — GLUCOSE, CAPILLARY
GLUCOSE-CAPILLARY: 124 mg/dL — AB (ref 65–99)
GLUCOSE-CAPILLARY: 66 mg/dL (ref 65–99)
GLUCOSE-CAPILLARY: 387 mg/dL — AB (ref 65–99)
GLUCOSE-CAPILLARY: 155 mg/dL — AB (ref 65–99)
GLUCOSE-CAPILLARY: 72 mg/dL (ref 65–99)
Glucose-Capillary: 108 mg/dL — ABNORMAL HIGH (ref 65–99)
Glucose-Capillary: 194 mg/dL — ABNORMAL HIGH (ref 65–99)
Glucose-Capillary: 130 mg/dL — ABNORMAL HIGH (ref 65–99)

## 2016-06-03 LAB — PROTIME-INR
INR: 2.13 — AB (ref 0.00–1.49)
Prothrombin Time: 23 seconds — ABNORMAL HIGH (ref 11.6–15.2)

## 2016-06-03 MED ORDER — GLUCERNA SHAKE PO LIQD
237.0000 mL | Freq: Three times a day (TID) | ORAL | Status: DC
  Administered 2016-06-03 – 2016-06-04 (×3): 237 mL via ORAL
  Filled 2016-06-03 (×5): qty 237

## 2016-06-03 MED ORDER — WARFARIN SODIUM 5 MG PO TABS
5.0000 mg | ORAL_TABLET | Freq: Once | ORAL | Status: AC
  Administered 2016-06-03: 5 mg via ORAL
  Filled 2016-06-03: qty 1

## 2016-06-03 NOTE — Progress Notes (Signed)
Initial Nutrition Assessment  INTERVENTION:   Provide Glucerna Shake po TID, each supplement provides 220 kcal and 10 grams of protein RD to follow-up for educational needs  NUTRITION DIAGNOSIS:   Unintentional weight loss related to chronic illness as evidenced by percent weight loss.  GOAL:   Patient will meet greater than or equal to 90% of their needs  MONITOR:   PO intake, Supplement acceptance, Labs, Weight trends, I & O's  REASON FOR ASSESSMENT:   Malnutrition Screening Tool    ASSESSMENT:   55 year old female with history of aortic stenosis status post aortic valve replacement on Coumadin, type 1 diabetes mellitus severely uncontrolled with MULTIPLE hospitalization for DKA with hypovolemic/septic shock who was recently discharged to skilled nursing facility was sent for vomiting and elevated blood sugar. In the ED she was found to be in severe DKA with anion gap of 29 and blood glucose of 963. She was admitted to stepdown unit on insulin drip and IV hydration.  Patient in room finishing her lunch tray. Pt observed to consume 95% of her lunch meal. Pt states her appetite is good and she watches her carbohydrate intake PTA. RD was unable to complete interview as RN and nurse tech entered room to take patient to another unit. RD will complete questioning at follow-up. Despite pt consuming most of her meals, pt has lost 18 lb since 6/22 (14% wt loss x 1 month, significant for time frame). Pt with noticeable depletion but unable to perform NFPE d/t transfer. Will attempt at follow-up.  Medications reviewed. Labs reviewed: Low Na  Diet Order:  Diet Carb Modified Fluid consistency:: Thin; Room service appropriate?: Yes  Skin:  Reviewed, no issues  Last BM:  7/12  Height:   Ht Readings from Last 1 Encounters:  06/01/16 5\' 3"  (1.6 m)    Weight:   Wt Readings from Last 1 Encounters:  06/01/16 114 lb 13.8 oz (52.1 kg)    Ideal Body Weight:  52.27 kg  BMI:  Body mass  index is 20.35 kg/(m^2).  Estimated Nutritional Needs:   Kcal:  1450-1650  Protein:  60-70g  Fluid:  1.6L/day  EDUCATION NEEDS:   Education needs no appropriate at this time  06/03/16, MS, RD, LDN Pager: [phone] After Hours Pager: [phone]

## 2016-06-03 NOTE — NC FL2 (Signed)
[place] MEDICAID FL2 LEVEL OF CARE SCREENING TOOL     IDENTIFICATION  Patient Name: Madison [name] Birthdate: 1961-11-19 Sex: female Admission Date (Current Location): 06/01/2016  [facility] and [place] Number:  Producer, television/film/video and Address:  [facility],  [address], [place] [address]      Provider Number: [id number]  Attending Physician Name and Address:  Eddie North, MD  Relative Name and Phone Number:       Current Level of Care: Hospital Recommended Level of Care: Skilled Nursing Facility Prior Approval Number:    Date Approved/Denied:   PASRR Number: [id number]  Discharge Plan: SNF    Current Diagnoses: Patient Active Problem List   Diagnosis Date Noted  . SIRS (systemic inflammatory response syndrome) (HCC) 05/25/2016  . Type 1 diabetes mellitus with ketoacidosis without coma (HCC)   . MDD (major depressive disorder), recurrent episode, moderate (HCC)   . DKA (diabetic ketoacidoses) (HCC) 05/17/2016  . Aspiration pneumonia (HCC) 05/17/2016  . Diabetic ketoacidosis without coma (HCC)   . Sepsis (HCC)   . 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 MSOF (multiple systems organ failure) 05/01/2016  . Acute hypoxemic respiratory failure (HCC)   . Type I diabetes mellitus with complication, uncontrolled (HCC) 03/29/2016  . Adjustment disorder with mixed anxiety and depressed mood 03/28/2016  . HLD (hyperlipidemia) 03/27/2016  . AKI (acute kidney injury) (HCC) 03/27/2016  . Chronic combined systolic and diastolic congestive heart failure (HCC)   . Diabetic ketoacidosis (HCC) 03/26/2016  . Type 1 diabetes, HbA1c goal < 7% (HCC)   . Hypotension 03/08/2016  . Uncontrolled type 1 diabetes mellitus with ketoacidotic coma (HCC)   . Acute encephalopathy 02/28/2016  . Hyponatremia: Pseudo 02/28/2016  . Lactic acidosis   . Diabetic ketoacidosis with coma associated with type 1 diabetes mellitus (HCC)   . Hypoglycemia   . Chronic diastolic  heart failure (HCC)   . Esophageal reflux   . S/P AVR (aortic valve replacement)   . ARF (acute renal failure) (HCC) 01/22/2016  . CHF (congestive heart failure) (HCC) 01/22/2016  . Diabetic ketoacidosis without coma associated with type 1 diabetes mellitus (HCC)   . DKA, type 1 (HCC) 11/23/2015  . Constipation 11/23/2015  . Leukocytosis   . Pressure ulcer 08/02/2015  . Diabetic ketoacidosis with coma associated with other specified diabetes mellitus (HCC)   . Closed left ankle fracture 07/01/2015  . Trimalleolar fracture of left ankle 07/01/2015  . Hyperkalemia   . Insomnia 06/16/2015  . Normocytic anemia 01/31/2015  . Acute on chronic combined systolic and diastolic CHF (congestive heart failure) (HCC)   . Essential hypertension   . Elevated troponin   . Diabetes type 1, uncontrolled (HCC)   . Demand ischemia (HCC)   . S/P aortic valve replacement   . Hypokalemia   . NSTEMI (non-ST elevated myocardial infarction) (HCC) 12/08/2014  . Blood poisoning (HCC)   . Supratherapeutic INR   . Severe sepsis with acute organ dysfunction (HCC) 12/03/2014  . Spastic hemiplegia affecting nondominant side (HCC) 11/29/2014  . Dysphagia, pharyngoesophageal phase 10/04/2014  . Sinus tachycardia (HCC) 09/30/2014  . Depression 04/29/2014  . CVA (cerebral infarction) 03/12/2014  . Acute respiratory failure (HCC) 03/12/2014  . Chronic anticoagulation 07/28/2013  . Long term (current) use of anticoagulants 07/28/2013  . CAD (coronary artery disease)   . Aortic stenosis   . Hyperlipidemia   . Rheumatoid arthritis (HCC)     Orientation RESPIRATION BLADDER Height & Weight  Self, Time, Situation, Place    Continent Weight: 52.1 kg (114 lb 13.8 oz) Height:  5\' 3"  (160 cm)  BEHAVIORAL SYMPTOMS/MOOD NEUROLOGICAL BOWEL NUTRITION STATUS      Continent Diet (carb modified)  AMBULATORY STATUS COMMUNICATION OF NEEDS Skin     Verbally Normal                       Personal Care Assistance  Level of Assistance  Bathing, Dressing           Functional Limitations Info             SPECIAL CARE FACTORS FREQUENCY  PT (By licensed PT), OT (By licensed OT)     PT Frequency: 5/wk OT Frequency: 5/wk            Contractures      Additional Factors Info  Code Status, Allergies, Insulin Sliding Scale, Isolation Precautions Code Status Info: FULL Allergies Info: Adhesive, Celebrex, Detrol   Insulin Sliding Scale Info: 6/day Isolation Precautions Info: MRSA     Current Medications (06/03/2016):  This is the current hospital active medication list Current Facility-Administered Medications  Medication Dose Route Frequency Provider Last Rate Last Dose  . 0.9 %  sodium chloride infusion   Intravenous Continuous 06/05/2016, NP      . citalopram (CELEXA) tablet 40 mg  40 mg Oral Daily Jinger Neighbors, MD   40 mg at 06/03/16 0958  . enoxaparin (LOVENOX) injection 50 mg  1 mg/kg Subcutaneous Q12H 06/05/16 [place], RPH   50 mg at 06/03/16 0844  . feeding supplement (GLUCERNA SHAKE) (GLUCERNA SHAKE) liquid 237 mL  237 mL Oral TID BM Nishant Dhungel, MD      . insulin aspart (novoLOG) injection 0-24 Units  0-24 Units Subcutaneous Q4H 05-09-1991, MD   4 Units at 06/03/16 1200  . insulin detemir (LEVEMIR) injection 20 Units  20 Units Subcutaneous Daily 06/05/16, NP   20 Units at 06/03/16 [phone]  . warfarin (COUMADIN) tablet 5 mg  5 mg Oral ONCE-1800 Anh P Pham, RPH      . Warfarin - Pharmacist Dosing Inpatient   Does not apply q1800 5631, RPH   0  at 06/02/16 1800     Discharge Medications: Please see discharge summary for a list of discharge medications.  Relevant Imaging Results:  Relevant Lab Results:   Additional Information SS#: 06/04/16  [id number], Izora Ribas

## 2016-06-03 NOTE — Progress Notes (Signed)
Pt transferred to room 1506. Left unit on bed pushed by this RN and nurse tech. Arrived in good/stable condition. VWilliams,rn.

## 2016-06-03 NOTE — Progress Notes (Signed)
PROGRESS NOTE                                                                                                                                                                                                             Patient Demographics:    Madison [name], is a 55 y.o. female, DOB - 1961-09-13, IRS:[id number]  Admit date - 06/01/2016   Admitting Physician Albertine Grates, MD  Outpatient Primary MD for the patient is [name], NP  LOS - 2  Outpatient Specialists:None  Chief Complaint  Patient presents with  . Hyperglycemia      This is her 14th hospitalization this year with similar presentation of DKA and septic/hypovolemic shock!!!   Brief Narrative   55 year old female with history of aortic stenosis status post aortic valve replacement on Coumadin, type 1 diabetes mellitus severely uncontrolled with MULTIPLE hospitalization for DKA with hypovolemic/septic shock who was recently discharged to skilled nursing facility was sent for vomiting and elevated blood sugar. In the ED she was found to be in severe DKA with anion gap of 29 and blood glucose of 963. She was admitted to stepdown unit on insulin drip and IV hydration.    Subjective:   Patient denies any symptoms. cbg fluctuating between 66-380s.   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 Patient used to live with a roommate and there was a concern that she is not adherent to her diet and insulin regimen. During her last hospitalization she was sent to skilled nursing facility. Patient's recurrent  hospitalization includes DKA associated with septic shock (no infective etiology diagnosed). She has been intubated on a few occasions as well. -cortisol level normal . Pending. and insulin antibodies. Check  A1c. continue lantus 20 units daily with SSI. Monitor cbg q4 hrs I will try to establish care with endocrinology prior to her discharge.  patient's recurrent hospitalization and declining overall health is quite concerning.  Septic shock Patient hypotensive, tachycardic and febrile on presentation. WBC of 15 K. I suspect this is associated with severe DKA. Patient has had antibiotics on several occasions with similar hospitalization recently. I would not use antibiotics at this time. Blood pressure now stable and patient afebrile.   History of  aortic stenosis status post aortic valve replacement On Coumadin. INR therapeutic. D/c Lovenox bridge.    Code Status : Full code  Family communication: None at bedside  Disposition Plan  : tx to med surg  Barriers For Discharge : Active symptoms  Consults  :  None  Procedures  : None  DVT Prophylaxis  :  On Coumadin  Lab Results  Component Value Date   PLT 313 06/02/2016    Antibiotics  :    Anti-infectives    None        Objective:   Filed Vitals:   06/03/16 0405 06/03/16 0500 06/03/16 0600 06/03/16 0800  BP: 125/60  131/52 123/57  Pulse: 69 82 71 76  Temp:      TempSrc:      Resp: 15 20 21 20   Height:      Weight:      SpO2: 98% 95% 96%     Wt Readings from Last 3 Encounters:  06/01/16 52.1 kg (114 lb 13.8 oz)  05/27/16 54.386 kg (119 lb 14.4 oz)  05/24/16 53.933 kg (118 lb 14.4 oz)     Intake/Output Summary (Last 24 hours) at 06/03/16 0847 Last data filed at 06/03/16 0600  Gross per 24 hour  Intake    350 ml  Output      0 ml  Net    350 ml     Physical Exam  Gen:not in distress HEENT: moist  mucosa, supple neck Chest: clear b/l, no added sounds CVS: N S1&S2, no murmurs, GI: soft, NT, ND Musculoskeletal: warm, no edema     Data Review:    CBC  Recent Labs Lab 06/01/16 1138 06/02/16 0305  WBC 14.4* 15.2*  HGB 10.3* 9.1*  HCT 33.8* 27.1*  PLT 380 313  MCV 93.6 86.3  MCH 28.5 29.0   MCHC 30.5 33.6  RDW 17.3* 16.3*  LYMPHSABS 1.4  --   MONOABS 0.3  --   EOSABS 0.0  --   BASOSABS 0.0  --     Chemistries   Recent Labs Lab 06/01/16 1138  06/01/16 2308 06/02/16 0305 06/02/16 0926 06/02/16 1224 06/02/16 1622  NA 124*  < > 134* 135 134* 133* 134*  K 5.8*  < > 3.8 3.9 4.1 4.2 4.4  CL 88*  < > 103 105 106 103 105  CO2 7*  < > 21* 20* 22 22 20*  GLUCOSE 963*  < > 86 146* 82 99 102*  BUN 20  < > 12 11 9 8 7   CREATININE 1.14*  < > 0.70 0.78 0.74 0.66 0.84  CALCIUM 8.7*  < > 8.7* 8.4* 8.4* 8.7* 8.5*  AST 22  --   --  20  --   --   --   ALT 15  --   --  12*  --   --   --   ALKPHOS 108  --   --  84  --   --   --   BILITOT 1.8*  --   --  0.9  --   --   --   < > = values in this interval not displayed. ------------------------------------------------------------------------------------------------------------------ No results for input(s): CHOL, HDL, LDLCALC, TRIG, CHOLHDL, LDLDIRECT in the last 72 hours.  Lab Results  Component Value Date   HGBA1C 8.6* 06/01/2016   ------------------------------------------------------------------------------------------------------------------ No results for input(s): TSH, T4TOTAL, T3FREE, THYROIDAB in the last 72 hours.  Invalid input(s): FREET3 ------------------------------------------------------------------------------------------------------------------ No results for input(s): VITAMINB12, FOLATE, FERRITIN,  TIBC, IRON, RETICCTPCT in the last 72 hours.  Coagulation profile  Recent Labs Lab 06/01/16 1138 06/02/16 0305 06/03/16 0313  INR 1.31 1.29 2.13*    No results for input(s): DDIMER in the last 72 hours.  Cardiac Enzymes No results for input(s): CKMB, TROPONINI, MYOGLOBIN in the last 168 hours.  Invalid input(s): CK ------------------------------------------------------------------------------------------------------------------    Component Value Date/Time   BNP 86.2 05/25/2016 1805    Inpatient  Medications  Scheduled Meds: . citalopram  40 mg Oral Daily  . enoxaparin (LOVENOX) injection  1 mg/kg Subcutaneous Q12H  . insulin aspart  0-24 Units Subcutaneous Q4H  . insulin detemir  20 Units Subcutaneous Daily  . Warfarin - Pharmacist Dosing Inpatient   Does not apply q1800   Continuous Infusions: . sodium chloride     PRN Meds:.  Micro Results Recent Results (from the past 240 hour(s))  MRSA PCR Screening     Status: None   Collection Time: 05/25/16 11:03 PM  Result Value Ref Range Status   MRSA by PCR NEGATIVE NEGATIVE Final    Comment:        The GeneXpert MRSA Assay (FDA approved for NASAL specimens only), is one component of a comprehensive MRSA colonization surveillance program. It is not intended to diagnose MRSA infection nor to guide or monitor treatment for MRSA infections.   Culture, blood (routine x 2)     Status: None   Collection Time: 05/26/16  1:20 AM  Result Value Ref Range Status   Specimen Description BLOOD RIGHT WRIST  Final   Special Requests IN PEDIATRIC BOTTLE 1CC  Final   Culture   Final    NO GROWTH 5 DAYS Performed at [facility]    Report Status 05/31/2016 FINAL  Final  Culture, blood (routine x 2)     Status: None   Collection Time: 05/26/16  1:25 AM  Result Value Ref Range Status   Specimen Description BLOOD RIGHT HAND  Final   Special Requests IN PEDIATRIC BOTTLE 1CC  Final   Culture   Final    NO GROWTH 5 DAYS Performed at [facility]    Report Status 05/31/2016 FINAL  Final    Radiology Reports Dg Chest 2 View  06/01/2016  CLINICAL DATA:  Hyperglycemia, vomiting this morning, increased weakness, prior LEFT side stroke EXAM: CHEST  2 VIEW COMPARISON:  05/25/2016 FINDINGS: Normal heart size post AVR. Mediastinal contours and pulmonary vascularity normal. Lungs clear. No pleural effusion or pneumothorax. Bones unremarkable. IMPRESSION: Post AVR. No acute abnormalities. Electronically Signed   By: Ulyses Southward  M.D.   On: 06/01/2016 11:39   Dg Chest 2 View  05/19/2016  CLINICAL DATA:  Followup right upper lobe infiltrate. EXAM: CHEST  2 VIEW COMPARISON:  05/17/2016 FINDINGS: Cardiac shadow is within normal limits. Postsurgical changes are again seen. The lungs are well aerated without focal infiltrate. Mild interstitial edema is seen. Small bilateral pleural with. IMPRESSION: Mild interstitial edema and small pleural effusions. Electronically Signed   By: Alcide Clever M.D.   On: 05/19/2016 10:19   Dg Chest Port 1 View  05/25/2016  CLINICAL DATA:  Altered mental status as well is hyperglycemia EXAM: PORTABLE CHEST 1 VIEW COMPARISON:  05/19/2016 FINDINGS: Sternotomy wires overlie normal cardiac silhouette. Normal pulmonary vasculature. No effusion, infiltrate, or pneumothorax. No acute osseous abnormality. IMPRESSION: No acute cardiopulmonary process. Electronically Signed   By: Genevive Bi M.D.   On: 05/25/2016 19:32   Dg Chest [facility]  05/17/2016  CLINICAL DATA:  Hyperglycemia and hypotension EXAM: PORTABLE CHEST 1 VIEW COMPARISON:  05/10/2016 FINDINGS: Mild airspace opacity in the right upper lobe may represent early infectious infiltrate or aspiration. The left lung is clear. There is no large effusion. The pulmonary vasculature is normal. IMPRESSION: Question early infectious infiltrate or aspiration in the right upper lobe. Electronically Signed   By: Ellery Plunk M.D.   On: 05/17/2016 03:36   Dg Chest Port 1 View  05/10/2016  CLINICAL DATA:  Diabetic ketoacidosis. EXAM: PORTABLE CHEST 1 VIEW COMPARISON:  05/02/2016 FINDINGS: Sternotomy wires are unchanged. Interval removal of nasogastric tube and endotracheal tube. Lungs are adequately inflated and otherwise clear. Cardiomediastinal silhouette and remainder of the exam is unchanged. IMPRESSION: No active disease. Electronically Signed   By: Elberta Fortis M.D.   On: 05/10/2016 13:39    Time Spent in minutes  25   Eddie North M.D on  06/03/2016 at 8:47 AM  Between 7am to 7pm - Pager - [phone]  After 7pm go to [email or web address] - password [facility]  [facility] -  Office  [phone]

## 2016-06-03 NOTE — Progress Notes (Signed)
Report called and given to Ardae, RN on 5 east. Pt made aware of transfer. Will be transferred to 1506. VWilliams,rn.

## 2016-06-03 NOTE — Progress Notes (Signed)
ANTICOAGULATION CONSULT NOTE - Follow Up Consult  Pharmacy Consult for lovenox and wafarin Indication: mechanical AVR  Allergies  Allergen Reactions  . Adhesive [Tape] Other (See Comments)    Can burn skin if left on too long  . Celebrex [Celecoxib] Rash  . Detrol [Tolterodine] Hives    Patient Measurements: Height: 5\' 3"  (160 cm) Weight: 114 lb 13.8 oz (52.1 kg) IBW/kg (Calculated) : 52.4 Heparin Dosing Weight:   Vital Signs: Temp: 98.5 F (36.9 C) (07/16 0331) Temp Source: Oral (07/16 0331) BP: 123/57 mmHg (07/16 0800) Pulse Rate: 76 (07/16 0800)  Labs:  Recent Labs  06/01/16 1138  06/02/16 0305 06/02/16 0926 06/02/16 1224 06/02/16 1622 06/03/16 0313  HGB 10.3*  --  9.1*  --   --   --   --   HCT 33.8*  --  27.1*  --   --   --   --   PLT 380  --  313  --   --   --   --   LABPROT 15.9*  --  15.8*  --   --   --  23.0*  INR 1.31  --  1.29  --   --   --  2.13*  CREATININE 1.14*  < > 0.78 0.74 0.66 0.84  --   < > = values in this interval not displayed.  Estimated Creatinine Clearance: 62.2 mL/min (by C-G formula based on Cr of 0.84).   Assessment: 73 YOF readmitted with DKA.Has had several admissions for DKA this year. She is on warfarin for mechanical aortic valve.   Patient's warfarin regimen has been changing on [facility] between each recent admission. Per anti-coag visit in 04/2016, their recommendation was 7.5 mg daily. During the most recent patient admissions, patient was receiving doses of 5-11 mg daily. Most recent [facility] MAR documents 3 mg daily. Last dose documented as 7/13 at 1700.  Today, 06/03/2016: - INR is therapeutic but increased sharply from 1.29 to 2.13 today (after 2 doses of 10 mg) - Hgb down slightly 9.1, plt wnl on 7/15 - no bleeding documented - scr stable (crcl>30) - on carb modified diet diet - no signif drug-drug intxns  Goal of Therapy:  INR 2.5-3.5 Anti-Xa level: 0.6-1    Plan:  - Reduce warfarin to 5 mg PO x1 today - continue  lovenox 50 mg SQ q12h - monitor for s/s bleeding  Ovila Lepage P 06/03/2016,9:48 AM

## 2016-06-03 NOTE — Progress Notes (Signed)
Pt blood sugar 66 at 0345. 15 grams of carbohydrates given to patient and cbg checked 15 minutes later. CBG now 108. Will continue to monitor.

## 2016-06-04 DIAGNOSIS — I69354 Hemiplegia and hemiparesis following cerebral infarction affecting left non-dominant side: Secondary | ICD-10-CM | POA: Diagnosis not present

## 2016-06-04 DIAGNOSIS — Z7901 Long term (current) use of anticoagulants: Secondary | ICD-10-CM | POA: Diagnosis not present

## 2016-06-04 DIAGNOSIS — Z951 Presence of aortocoronary bypass graft: Secondary | ICD-10-CM | POA: Diagnosis not present

## 2016-06-04 DIAGNOSIS — Z5181 Encounter for therapeutic drug level monitoring: Secondary | ICD-10-CM | POA: Diagnosis not present

## 2016-06-04 DIAGNOSIS — E440 Moderate protein-calorie malnutrition: Secondary | ICD-10-CM | POA: Diagnosis present

## 2016-06-04 DIAGNOSIS — Z6820 Body mass index (BMI) 20.0-20.9, adult: Secondary | ICD-10-CM | POA: Diagnosis not present

## 2016-06-04 DIAGNOSIS — E108 Type 1 diabetes mellitus with unspecified complications: Secondary | ICD-10-CM

## 2016-06-04 DIAGNOSIS — E1010 Type 1 diabetes mellitus with ketoacidosis without coma: Secondary | ICD-10-CM | POA: Diagnosis not present

## 2016-06-04 DIAGNOSIS — R2689 Other abnormalities of gait and mobility: Secondary | ICD-10-CM | POA: Diagnosis not present

## 2016-06-04 DIAGNOSIS — Z955 Presence of coronary angioplasty implant and graft: Secondary | ICD-10-CM | POA: Diagnosis not present

## 2016-06-04 DIAGNOSIS — I5032 Chronic diastolic (congestive) heart failure: Secondary | ICD-10-CM | POA: Diagnosis not present

## 2016-06-04 DIAGNOSIS — E1065 Type 1 diabetes mellitus with hyperglycemia: Secondary | ICD-10-CM | POA: Diagnosis not present

## 2016-06-04 DIAGNOSIS — M069 Rheumatoid arthritis, unspecified: Secondary | ICD-10-CM | POA: Diagnosis not present

## 2016-06-04 DIAGNOSIS — E109 Type 1 diabetes mellitus without complications: Secondary | ICD-10-CM | POA: Diagnosis not present

## 2016-06-04 DIAGNOSIS — R6520 Severe sepsis without septic shock: Secondary | ICD-10-CM | POA: Diagnosis not present

## 2016-06-04 DIAGNOSIS — E1165 Type 2 diabetes mellitus with hyperglycemia: Secondary | ICD-10-CM | POA: Diagnosis not present

## 2016-06-04 DIAGNOSIS — E871 Hypo-osmolality and hyponatremia: Secondary | ICD-10-CM | POA: Diagnosis not present

## 2016-06-04 DIAGNOSIS — E0810 Diabetes mellitus due to underlying condition with ketoacidosis without coma: Secondary | ICD-10-CM | POA: Diagnosis not present

## 2016-06-04 DIAGNOSIS — R7889 Finding of other specified substances, not normally found in blood: Secondary | ICD-10-CM | POA: Diagnosis not present

## 2016-06-04 DIAGNOSIS — R278 Other lack of coordination: Secondary | ICD-10-CM | POA: Diagnosis not present

## 2016-06-04 DIAGNOSIS — Z794 Long term (current) use of insulin: Secondary | ICD-10-CM | POA: Diagnosis not present

## 2016-06-04 DIAGNOSIS — D649 Anemia, unspecified: Secondary | ICD-10-CM | POA: Diagnosis not present

## 2016-06-04 DIAGNOSIS — F329 Major depressive disorder, single episode, unspecified: Secondary | ICD-10-CM | POA: Diagnosis not present

## 2016-06-04 DIAGNOSIS — I2510 Atherosclerotic heart disease of native coronary artery without angina pectoris: Secondary | ICD-10-CM | POA: Diagnosis not present

## 2016-06-04 DIAGNOSIS — R6521 Severe sepsis with septic shock: Secondary | ICD-10-CM

## 2016-06-04 DIAGNOSIS — Z79899 Other long term (current) drug therapy: Secondary | ICD-10-CM | POA: Diagnosis not present

## 2016-06-04 DIAGNOSIS — M6281 Muscle weakness (generalized): Secondary | ICD-10-CM | POA: Diagnosis not present

## 2016-06-04 DIAGNOSIS — R739 Hyperglycemia, unspecified: Secondary | ICD-10-CM | POA: Diagnosis not present

## 2016-06-04 DIAGNOSIS — I110 Hypertensive heart disease with heart failure: Secondary | ICD-10-CM | POA: Diagnosis not present

## 2016-06-04 DIAGNOSIS — A419 Sepsis, unspecified organism: Secondary | ICD-10-CM | POA: Diagnosis present

## 2016-06-04 DIAGNOSIS — E162 Hypoglycemia, unspecified: Secondary | ICD-10-CM | POA: Diagnosis not present

## 2016-06-04 DIAGNOSIS — N179 Acute kidney failure, unspecified: Secondary | ICD-10-CM | POA: Diagnosis not present

## 2016-06-04 DIAGNOSIS — E1069 Type 1 diabetes mellitus with other specified complication: Secondary | ICD-10-CM | POA: Diagnosis not present

## 2016-06-04 DIAGNOSIS — D72829 Elevated white blood cell count, unspecified: Secondary | ICD-10-CM | POA: Diagnosis not present

## 2016-06-04 DIAGNOSIS — I252 Old myocardial infarction: Secondary | ICD-10-CM | POA: Diagnosis not present

## 2016-06-04 DIAGNOSIS — Z952 Presence of prosthetic heart valve: Secondary | ICD-10-CM | POA: Diagnosis not present

## 2016-06-04 DIAGNOSIS — R7309 Other abnormal glucose: Secondary | ICD-10-CM | POA: Diagnosis not present

## 2016-06-04 DIAGNOSIS — I10 Essential (primary) hypertension: Secondary | ICD-10-CM | POA: Diagnosis not present

## 2016-06-04 LAB — GLUCOSE, CAPILLARY
GLUCOSE-CAPILLARY: 16 mg/dL — AB (ref 65–99)
GLUCOSE-CAPILLARY: 268 mg/dL — AB (ref 65–99)
GLUCOSE-CAPILLARY: 62 mg/dL — AB (ref 65–99)
GLUCOSE-CAPILLARY: 72 mg/dL (ref 65–99)
Glucose-Capillary: 127 mg/dL — ABNORMAL HIGH (ref 65–99)
Glucose-Capillary: 185 mg/dL — ABNORMAL HIGH (ref 65–99)
Glucose-Capillary: 381 mg/dL — ABNORMAL HIGH (ref 65–99)

## 2016-06-04 LAB — PROTIME-INR
INR: 2.79 — ABNORMAL HIGH (ref 0.00–1.49)
PROTHROMBIN TIME: 28.1 s — AB (ref 11.6–15.2)

## 2016-06-04 MED ORDER — GLUCERNA SHAKE PO LIQD: 237.0000 mL | Freq: Three times a day (TID) | ORAL | Status: DC

## 2016-06-04 MED ORDER — ACETAMINOPHEN 325 MG PO TABS
650.0000 mg | ORAL_TABLET | Freq: Four times a day (QID) | ORAL | Status: DC | PRN
  Administered 2016-06-04: 650 mg via ORAL
  Filled 2016-06-04: qty 2

## 2016-06-04 MED ORDER — WARFARIN SODIUM 5 MG PO TABS: 5.0000 mg | ORAL_TABLET | Freq: Once | ORAL | Status: DC

## 2016-06-04 NOTE — Discharge Summary (Addendum)
Physician Discharge Summary  Madison [name] EGB:[id number] DOB: [birth year] DOA: 06/01/2016  PCP: Dorothyann Peng, NP  Admit date: 06/01/2016 Discharge date: 06/04/2016  Admitted From: SNF Disposition:  SNF ( [facility])  Recommendations for Outpatient Follow-up:  1. Follow up with MD at SNF in 1 week. 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 Has had multiple hospitalizations for DKA and related sepsis. (This is her 14 hospitalization this year for same symptoms.) 2. Please monitor CBG at least every 4 hours. 3. Follow up with endocrinology Dr Ebony Hail on 06/08/2016    Equipment/Devices: none  Discharge Condition: guarded CODE STATUS: full code Diet recommendation: Heart Healthy / Carb Modified    Discharge Diagnoses:  Principal Problem:   DKA, type 1 ([place])   Active Problems:   Chronic anticoagulation   CVA (cerebral infarction)   Chronic diastolic heart failure (HCC)   Type I diabetes mellitus with complication, uncontrolled (HCC)   Malnutrition of moderate degree   Septic shock (HCC)  Brief narrative/history of present illness  55 year old female with history of aortic stenosis status post aortic valve replacement on Coumadin, type 1 diabetes mellitus severely uncontrolled with MULTIPLE hospitalization for DKA with hypovolemic/septic shock who was recently discharged to skilled nursing facility was sent for vomiting and elevated blood sugar. In the ED she was found to be in severe DKA with anion gap of 29 and blood glucose of 963. She was admitted to stepdown unit on insulin drip and IV hydration.  Principal problem Diabetic ketoacidosis without, associated with type 1 diabetes mellitus(HCC) -patient has brittle diabetes with recurrent hospitalization for DKA and shock. -Patient used to live with a roommate and there was a concern that she is not adherent to her diet and insulin regimen. During her last hospitalization she was sent to  skilled nursing facility. Patient's recurrent hospitalization includes DKA associated with septic shock (no infective etiology diagnosed). She has been intubated on a few occasions as well. -cortisol level normal . Anti-insulin antibodies sent and can be followed as outpatient. A1c of 8.6. -CBG has remained stable past 48 hours. -Continue Lantus 8 units daily with sliding scale coverage. patient's recurrent hospitalization and declining overall health is quite concerning. She was seen by psychiatry during recent hospitalization and deemed competent.  I have arranged appointment to establish care with endocrinologist Dr. Ebony Hail for later this week.   Septic shock Patient hypotensive, tachycardic and febrile on presentation. WBC of 15 K. I suspect this is associated with severe DKA. Patient has had antibiotics on several occasions with similar hospitalization recently. Remains afebrile and WBC normal. Did not use  antibiotics.  History of aortic stenosis status post aortic valve replacement On Coumadin. INR therapeutic.   Protein calorie malnutrition, moderate Added supplements   Patient can be discharged back to skilled nursing facility.  Code Status : Full code  Family communication: None at bedside  Disposition Plan : tx to med surg  Barriers For Discharge : Active symptoms  Consults : None  Procedures : None  Discharge Instructions     Medication List    ASK your doctor about these medications        citalopram 40 MG tablet  Commonly known as:  CELEXA  Take 1 tablet (40 mg total) by mouth daily.     furosemide 20 MG tablet  Commonly known as:  LASIX  Take 1 tablet (20 mg total) by mouth daily.     glucose blood test strip  Use as instructed  insulin aspart 100 UNIT/ML FlexPen  Commonly known as:  NOVOLOG FLEXPEN  Inject 0-9 Units into the skin 3 (three) times daily with meals. CBG < 70: eat or drink something and recheck, CBG 70 - 120: 0 units CBG  121 - 150: 2 unit CBG 151 - 200: 3 units CBG 201 - 250: 5 units CBG 251 - 300:  6units CBG 301 - 350:  7units CBG 351 - 400: 10 units CBG > 400: call MD.     insulin detemir 100 UNIT/ML injection  Commonly known as:  LEVEMIR  Inject 0.08 mLs (8 Units total) into the skin daily.     omeprazole 40 MG capsule  Commonly known as:  PRILOSEC  TAKE ONE CAPSULE BY MOUTH EVERY DAY     ondansetron 4 MG tablet  Commonly known as:  ZOFRAN  Take 4 mg by mouth every 4 (four) hours as needed for nausea or vomiting. Reported on 05/16/2016     [place] w/Device Kit  Use to check glucose 2x per day     pravastatin 20 MG tablet  Commonly known as:  PRAVACHOL  Take 20 mg by mouth daily.     warfarin 3 MG tablet  Commonly known as:  COUMADIN  Take 3 mg by mouth daily.           Follow-up Information    Follow up with [facility].   Specialty:  [place] information:   [place] [phone]      Follow up with Renato Shin, MD On 06/08/2016.   Specialty:  Endocrinology   Why:  8 am   Contact information:   [address] [place] [address] [phone]      Allergies  Allergen Reactions  . Adhesive [Tape] Other (See Comments)    Can burn skin if left on too long  . Celebrex [Celecoxib] Rash  . Detrol [Tolterodine] Hives    Consultations:  None   Procedures/Studies: Dg Chest 2 View  06/01/2016  CLINICAL DATA:  Hyperglycemia, vomiting this morning, increased weakness, prior LEFT side stroke EXAM: CHEST  2 VIEW COMPARISON:  05/25/2016 FINDINGS: Normal heart size post AVR. Mediastinal contours and pulmonary vascularity normal. Lungs clear. No pleural effusion or pneumothorax. Bones unremarkable. IMPRESSION: Post AVR. No acute abnormalities. Electronically Signed   By: Lavonia Dana M.D.   On: 06/01/2016 11:39   Dg Chest 2 View  05/19/2016  CLINICAL DATA:   Followup right upper lobe infiltrate. EXAM: CHEST  2 VIEW COMPARISON:  05/17/2016 FINDINGS: Cardiac shadow is within normal limits. Postsurgical changes are again seen. The lungs are well aerated without focal infiltrate. Mild interstitial edema is seen. Small bilateral pleural with. IMPRESSION: Mild interstitial edema and small pleural effusions. Electronically Signed   By: Inez Catalina M.D.   On: 05/19/2016 10:19   Dg Chest Port 1 View  05/25/2016  CLINICAL DATA:  Altered mental status as well is hyperglycemia EXAM: PORTABLE CHEST 1 VIEW COMPARISON:  05/19/2016 FINDINGS: Sternotomy wires overlie normal cardiac silhouette. Normal pulmonary vasculature. No effusion, infiltrate, or pneumothorax. No acute osseous abnormality. IMPRESSION: No acute cardiopulmonary process. Electronically Signed   By: Suzy Bouchard M.D.   On: 05/25/2016 19:32   Dg Chest Port 1 View  05/17/2016  CLINICAL DATA:  Hyperglycemia and hypotension EXAM: PORTABLE CHEST 1 VIEW COMPARISON:  05/10/2016 FINDINGS: Mild airspace opacity in the right upper lobe may represent early  infectious infiltrate or aspiration. The left lung is clear. There is no large effusion. The pulmonary vasculature is normal. IMPRESSION: Question early infectious infiltrate or aspiration in the right upper lobe. Electronically Signed   By: Andreas Newport M.D.   On: 05/17/2016 03:36   Dg Chest Port 1 View  05/10/2016  CLINICAL DATA:  Diabetic ketoacidosis. EXAM: PORTABLE CHEST 1 VIEW COMPARISON:  05/02/2016 FINDINGS: Sternotomy wires are unchanged. Interval removal of nasogastric tube and endotracheal tube. Lungs are adequately inflated and otherwise clear. Cardiomediastinal silhouette and remainder of the exam is unchanged. IMPRESSION: No active disease. Electronically Signed   By: Marin Olp M.D.   On: 05/10/2016 13:39      Discharge Exam: Filed Vitals:   06/04/16 0138 06/04/16 0647  BP: 137/64 137/56  Pulse: 68 64  Temp: 98.9 F (37.2 C) 97.9 F  (36.6 C)  Resp: 17 18   Filed Vitals:   06/03/16 1851 06/03/16 2108 06/04/16 0138 06/04/16 0647  BP: 117/57 109/68 137/64 137/56  Pulse: 68 84 68 64  Temp: 99.5 F (37.5 C) 98.7 F (37.1 C) 98.9 F (37.2 C) 97.9 F (36.6 C)  TempSrc: Oral Oral Oral Oral  Resp: _0 Height:      Weight:      SpO2: 99% 99% 100% 100%     Gen:not in distress HEENT: moist mucosa, supple neck Chest: clear b/l, no added sounds CVS: N S1&S2, no murmurs, GI: soft, NT, ND Musculoskeletal: warm, no edema CNS: Alert and oriented   The results of significant diagnostics from this hospitalization (including imaging, microbiology, ancillary and laboratory) are listed below for reference.     Microbiology: Recent Results (from the past 240 hour(s))  MRSA PCR Screening     Status: None   Collection Time: 05/25/16 11:03 PM  Result Value Ref Range Status   MRSA by PCR NEGATIVE NEGATIVE Final    Comment:        The GeneXpert MRSA Assay (FDA approved for NASAL specimens only), is one component of a comprehensive MRSA colonization surveillance program. It is not intended to diagnose MRSA infection nor to guide or monitor treatment for MRSA infections.   Culture, blood (routine x 2)     Status: None   Collection Time: 05/26/16  1:20 AM  Result Value Ref Range Status   Specimen Description BLOOD RIGHT WRIST  Final   Special Requests IN PEDIATRIC BOTTLE [place]  Final   Culture   Final    NO GROWTH 5 DAYS Performed at [facility]    Report Status 05/31/2016 FINAL  Final  Culture, blood (routine x 2)     Status: None   Collection Time: 05/26/16  1:25 AM  Result Value Ref Range Status   Specimen Description BLOOD RIGHT HAND  Final   Special Requests IN PEDIATRIC BOTTLE [place]  Final   Culture   Final    NO GROWTH 5 DAYS Performed at [facility]    Report Status 05/31/2016 FINAL  Final  Culture, blood (routine x 2)     Status: None (Preliminary result)   Collection Time:  06/02/16  9:26 AM  Result Value Ref Range Status   Specimen Description BLOOD LEFT [facility]  Final   Special Requests IN PEDIATRIC BOTTLE 2CC  Final   Culture   Final    NO GROWTH < 24 HOURS Performed at [facility]    Report Status PENDING  Incomplete  Culture, blood (routine x 2)  Status: None (Preliminary result)   Collection Time: 06/02/16 12:24 PM  Result Value Ref Range Status   Specimen Description BLOOD LEFT [place]  Final   Special Requests IN PEDIATRIC BOTTLE [place]  Final   Culture   Final    NO GROWTH < 24 HOURS Performed at [facility]    Report Status PENDING  Incomplete     Labs: BNP (last 3 results)  Recent Labs  03/17/16 1801 03/27/16 0832 05/25/16 1805  BNP 95.3 73.7 29.9   Basic Metabolic Panel:  Recent Labs Lab 06/01/16 2308 06/02/16 0305 06/02/16 0926 06/02/16 1224 06/02/16 1622  NA 134* 135 134* 133* 134*  K 3.8 3.9 4.1 4.2 4.4  CL 103 105 106 103 105  CO2 21* 20* 22 22 20*  GLUCOSE 86 146* 82 99 102*  BUN _0 CREATININE 0.70 0.78 0.74 0.66 0.84  CALCIUM 8.7* 8.4* 8.4* 8.7* 8.5*   Liver Function Tests:  Recent Labs Lab 06/01/16 1138 06/02/16 0305  AST 22 20  ALT 15 12*  ALKPHOS 108 84  BILITOT 1.8* 0.9  PROT 6.8 5.4*  ALBUMIN 3.6 2.8*   No results for input(s): LIPASE, AMYLASE in the last 168 hours. No results for input(s): AMMONIA in the last 168 hours. CBC:  Recent Labs Lab 06/01/16 1138 06/02/16 0305  WBC 14.4* 15.2*  NEUTROABS 12.7*  --   HGB 10.3* 9.1*  HCT 33.8* 27.1*  MCV 93.6 86.3  PLT 380 313   Cardiac Enzymes: No results for input(s): CKTOTAL, CKMB, CKMBINDEX, TROPONINI in the last 168 hours. BNP: Invalid input(s): POCBNP CBG:  Recent Labs Lab 06/03/16 2017 06/03/16 2102 06/03/16 2334 06/04/16 0421 06/04/16 0752  GLUCAP 72 130* 127* 185* 268*   D-Dimer No results for input(s): DDIMER in the last 72 hours. Hgb A1c  Recent Labs  06/01/16 1138  HGBA1C 8.6*   Lipid  Profile No results for input(s): CHOL, HDL, LDLCALC, TRIG, CHOLHDL, LDLDIRECT in the last 72 hours. Thyroid function studies No results for input(s): TSH, T4TOTAL, T3FREE, THYROIDAB in the last 72 hours.  Invalid input(s): FREET3 Anemia work up No results for input(s): VITAMINB12, FOLATE, FERRITIN, TIBC, IRON, RETICCTPCT in the last 72 hours. Urinalysis    Component Value Date/Time   COLORURINE YELLOW 06/01/2016 1104   APPEARANCEUR CLEAR 06/01/2016 1104   LABSPEC 1.022 06/01/2016 1104   PHURINE 5.5 06/01/2016 1104   GLUCOSEU >1000* 06/01/2016 1104   HGBUR NEGATIVE 06/01/2016 1104   BILIRUBINUR NEGATIVE 06/01/2016 1104   BILIRUBINUR n 06/16/2015 1244   KETONESUR >80* 06/01/2016 1104   PROTEINUR NEGATIVE 06/01/2016 1104   PROTEINUR n 06/16/2015 1244   UROBILINOGEN 0.2 07/30/2015 1415   UROBILINOGEN 0.2 06/16/2015 1244   NITRITE NEGATIVE 06/01/2016 1104   NITRITE n 06/16/2015 1244   LEUKOCYTESUR NEGATIVE 06/01/2016 1104   Sepsis Labs Invalid input(s): PROCALCITONIN,  WBC,  LACTICIDVEN Microbiology Recent Results (from the past 240 hour(s))  MRSA PCR Screening     Status: None   Collection Time: 05/25/16 11:03 PM  Result Value Ref Range Status   MRSA by PCR NEGATIVE NEGATIVE Final    Comment:        The GeneXpert MRSA Assay (FDA approved for NASAL specimens only), is one component of a comprehensive MRSA colonization surveillance program. It is not intended to diagnose MRSA infection nor to guide or monitor treatment for MRSA infections.   Culture, blood (routine x 2)     Status: None   Collection Time:  05/26/16  1:20 AM  Result Value Ref Range Status   Specimen Description BLOOD RIGHT WRIST  Final   Special Requests IN PEDIATRIC BOTTLE 1CC  Final   Culture   Final    NO GROWTH 5 DAYS Performed at [facility]    Report Status 05/31/2016 FINAL  Final  Culture, blood (routine x 2)     Status: None   Collection Time: 05/26/16  1:25 AM  Result Value Ref  Range Status   Specimen Description BLOOD RIGHT HAND  Final   Special Requests IN PEDIATRIC BOTTLE [place]  Final   Culture   Final    NO GROWTH 5 DAYS Performed at [facility]    Report Status 05/31/2016 FINAL  Final  Culture, blood (routine x 2)     Status: None (Preliminary result)   Collection Time: 06/02/16  9:26 AM  Result Value Ref Range Status   Specimen Description BLOOD LEFT [facility]  Final   Special Requests IN PEDIATRIC BOTTLE 2CC  Final   Culture   Final    NO GROWTH < 24 HOURS Performed at [facility]    Report Status PENDING  Incomplete  Culture, blood (routine x 2)     Status: None (Preliminary result)   Collection Time: 06/02/16 12:24 PM  Result Value Ref Range Status   Specimen Description BLOOD LEFT [facility]  Final   Special Requests IN PEDIATRIC BOTTLE 2CC  Final   Culture   Final    NO GROWTH < 24 HOURS Performed at [facility]    Report Status PENDING  Incomplete     Time coordinating discharge: Over 30 minutes  SIGNED:   Louellen Molder, MD  [facility] 06/04/2016, 11:10 AM Pager   If 7PM-7AM, please contact night-coverage [email or web address] Password [email or web address]

## 2016-06-04 NOTE — Patient Outreach (Signed)
[facility]) Care Management  06/04/2016  Madison [name] 08/20/1961 [id number]    This RNCM made telephone contact with Glynis Smiles, RN Admission's Coordinator for PACE. Elisa stated she would not not be able to assess patient for PACE Program until she is discharged from the acute care and Skilled Nursing Facility setting.   Patient is currently in the acute care setting awaiting discharge.  Plan: Continue to assess for disposition

## 2016-06-04 NOTE — Clinical Social Work Placement (Signed)
   CLINICAL SOCIAL WORK PLACEMENT  NOTE  Date:  06/04/2016  Patient Details  Name: Madison [name] MRN: [id number] Date of Birth: 09/02/1961  Clinical Social Work is seeking post-discharge placement for this patient at the   level of care (*CSW will initial, date and re-position this form in  chart as items are completed):  Yes   Patient/family provided with [place] Clinical Social Work Department's list of facilities offering this level of care within the geographic area requested by the patient (or if unable, by the patient's family).  Yes   Patient/family informed of their freedom to choose among providers that offer the needed level of care, that participate in Medicare, Medicaid or managed care program needed by the patient, have an available bed and are willing to accept the patient.  Yes   Patient/family informed of [place]'s ownership interest in [facility] and [facility], as well as of the fact that they are under no obligation to receive care at these facilities.  PASRR submitted to EDS on       PASRR number received on       Existing PASRR number confirmed on 06/04/16     FL2 transmitted to all facilities in geographic area requested by pt/family on       FL2 transmitted to all facilities within larger geographic area on 06/04/16     Patient informed that his/her managed care company has contracts with or will negotiate with certain facilities, including the following:        Yes   Patient/family informed of bed offers received.  Patient chooses bed at [facility]     Physician recommends and patient chooses bed at      Patient to be transferred to [facility] on 06/04/16.  Patient to be transferred to facility by [facility]     Patient family notified on 06/04/16 of transfer.  Name of family member notified:  Patient unable to contact/phone not working     PHYSICIAN Please prepare priority discharge summary, including  medications     Additional Comment:    _______________________________________________ Clearance Coots, LCSW 06/04/2016, 1:42 PM

## 2016-06-04 NOTE — Progress Notes (Signed)
ANTICOAGULATION CONSULT NOTE - Follow Up Consult  Pharmacy Consult for lovenox and wafarin Indication: mechanical AVR  Allergies  Allergen Reactions  . Adhesive [Tape] Other (See Comments)    Can burn skin if left on too long  . Celebrex [Celecoxib] Rash  . Detrol [Tolterodine] Hives    Patient Measurements: Height: 5\' 3"  (160 cm) Weight: 114 lb 13.8 oz (52.1 kg) IBW/kg (Calculated) : 52.4  Vital Signs: Temp: 97.9 F (36.6 C) (07/17 0647) Temp Source: Oral (07/17 0647) BP: 137/56 mmHg (07/17 0647) Pulse Rate: 64 (07/17 0647)  Labs:  Recent Labs  06/01/16 1138  06/02/16 0305 06/02/16 0926 06/02/16 1224 06/02/16 1622 06/03/16 0313 06/04/16 0529  HGB 10.3*  --  9.1*  --   --   --   --   --   HCT 33.8*  --  27.1*  --   --   --   --   --   PLT 380  --  313  --   --   --   --   --   LABPROT 15.9*  --  15.8*  --   --   --  23.0* 28.1*  INR 1.31  --  1.29  --   --   --  2.13* 2.79*  CREATININE 1.14*  < > 0.78 0.74 0.66 0.84  --   --   < > = values in this interval not displayed.  Estimated Creatinine Clearance: 62.2 mL/min (by C-G formula based on Cr of 0.84).   Assessment: 16 YOF readmitted with DKA.Has had several admissions for DKA this year. She is on warfarin for mechanical aortic valve.   Patient's warfarin regimen has been changing on NH MAR between each recent admission. Per anti-coag visit in 04/2016, their recommendation was 7.5 mg daily. During the most recent patient admissions, patient was receiving doses of 5-11 mg daily. Most recent NH MAR documents 3 mg daily. Last dose documented as 7/13 at 1700.  INR sub-therapeutic on admission.  Today, 06/04/2016: - INR therapeutic today  - Hgb down slightly 9.1, plt wnl on 7/15 - no bleeding documented - scr stable (crcl>30) - on carb modified diet diet - no signif drug-drug intxns  Goal of Therapy:  INR 2.5-3.5 Anti-Xa level: 0.6-1    Plan:  - Continue warfarin to 5 mg PO x1 today - D/C lovenox  -  Monitor for s/s bleeding -Would recommend d/c to NH on 5mg  daily with close INR monitoring if discharged today  8/15, PharmD, BCPS Pager: [phone] 06/04/2016,7:55 AM

## 2016-06-04 NOTE — Consult Note (Signed)
   [facility] CM Inpatient Consult   06/04/2016  Madison [name] 1961/08/25 [id number]   Madison [name] has been active with [facility] Care Management services. She was at [facility] prior to admission. [facility] RNCM already aware of hospital admission. It appears the discharge plan is to return to [facility]. Will continue to follow.   Raiford Noble, MSN-Ed, RN,BSN [facility] Liaison [phone]

## 2016-06-04 NOTE — Care Management Important Message (Signed)
Important Message  Patient Details  Name: Madison [name] MRN: [id number] Date of Birth: May 24, 1961   Medicare Important Message Given:  Yes    Haskell Flirt 06/04/2016, 9:58 AMImportant Message  Patient Details  Name: Madison [name] MRN: [id number] Date of Birth: [birth year]   Medicare Important Message Given:  Yes    Haskell Flirt 06/04/2016, 9:58 AM

## 2016-06-04 NOTE — Progress Notes (Signed)
Patient accepted/returning to [name].  Waiting on [facility] Authorization.

## 2016-06-04 NOTE — Progress Notes (Addendum)
Hypoglycemic Event  CBG: 62  Treatment: 15 GM carbohydrate snack  Symptoms: Pale and None  Follow-up CBG: Time:1210 CBG Result: 72  Possible Reasons for Event: Unknown  Comments/MD notified: Dhungel    Justin Mend

## 2016-06-04 NOTE — Progress Notes (Signed)
Nutrition Follow-up  DOCUMENTATION CODES:   Non-severe (moderate) malnutrition in context of chronic illness  INTERVENTION:   Continue Glucerna Shake po TID, each supplement provides 220 kcal and 10 grams of protein Encourage consistent intakes RD to continue to monitor  NUTRITION DIAGNOSIS:   Unintentional weight loss related to chronic illness as evidenced by percent weight loss.  Ongoing.  GOAL:   Patient will meet greater than or equal to 90% of their needs  Meeting.  MONITOR:   PO intake, Supplement acceptance, Labs, Weight trends, I & O's  ASSESSMENT:   55 year old female with history of aortic stenosis status post aortic valve replacement on Coumadin, type 1 diabetes mellitus severely uncontrolled with MULTIPLE hospitalization for DKA with hypovolemic/septic shock who was recently discharged to skilled nursing facility was sent for vomiting and elevated blood sugar. In the ED she was found to be in severe DKA with anion gap of 29 and blood glucose of 963. She was admitted to stepdown unit on insulin drip and IV hydration.  Patient in room drinking Glucerna shake. Pt states she drinks at least 1 a day but uses it as a meal replacement. RD encouraged pt to still consume 3 meals a day and use the supplement as a snack instead of a meal replacement. Offered education to patient but she declines at this time. Pt states her blood sugars have been normal, however her CBGs are elevated in the 200's. Patient would benefit from education once receptive.   Nutrition-Focused physical exam completed. Findings are no fat depletion, mild muscle depletion, and mild edema.   Medications reviewed. Labs reviewed: CBGs: 127-268  Diet Order:  Diet Carb Modified Fluid consistency:: Thin; Room service appropriate?: Yes  Skin:  Reviewed, no issues  Last BM:  7/12  Height:   Ht Readings from Last 1 Encounters:  06/01/16 5\' 3"  (1.6 m)    Weight:   Wt Readings from Last 1  Encounters:  06/01/16 114 lb 13.8 oz (52.1 kg)    Ideal Body Weight:  52.27 kg  BMI:  Body mass index is 20.35 kg/(m^2).  Estimated Nutritional Needs:   Kcal:  1450-1650  Protein:  60-70g  Fluid:  1.6L/day  EDUCATION NEEDS:   Education needs no appropriate at this time  06/03/16, MS, RD, LDN Pager: [phone] After Hours Pager: [phone]

## 2016-06-05 ENCOUNTER — Observation Stay (HOSPITAL_COMMUNITY)
Admission: EM | Admit: 2016-06-05 | Discharge: 2016-06-08 | Disposition: A | Discharge status: Skilled Nursing Facility | Payer: Commercial Managed Care - HMO | Attending: Internal Medicine | Admitting: Internal Medicine

## 2016-06-05 ENCOUNTER — Ambulatory Visit: Payer: Self-pay

## 2016-06-05 DIAGNOSIS — I2510 Atherosclerotic heart disease of native coronary artery without angina pectoris: Secondary | ICD-10-CM | POA: Insufficient documentation

## 2016-06-05 DIAGNOSIS — E871 Hypo-osmolality and hyponatremia: Secondary | ICD-10-CM | POA: Diagnosis present

## 2016-06-05 DIAGNOSIS — D649 Anemia, unspecified: Secondary | ICD-10-CM | POA: Diagnosis not present

## 2016-06-05 DIAGNOSIS — Z952 Presence of prosthetic heart valve: Secondary | ICD-10-CM | POA: Insufficient documentation

## 2016-06-05 DIAGNOSIS — E440 Moderate protein-calorie malnutrition: Secondary | ICD-10-CM | POA: Diagnosis not present

## 2016-06-05 DIAGNOSIS — D72829 Elevated white blood cell count, unspecified: Secondary | ICD-10-CM | POA: Diagnosis not present

## 2016-06-05 DIAGNOSIS — E1165 Type 2 diabetes mellitus with hyperglycemia: Principal | ICD-10-CM | POA: Insufficient documentation

## 2016-06-05 DIAGNOSIS — Z951 Presence of aortocoronary bypass graft: Secondary | ICD-10-CM | POA: Insufficient documentation

## 2016-06-05 DIAGNOSIS — R739 Hyperglycemia, unspecified: Secondary | ICD-10-CM | POA: Diagnosis not present

## 2016-06-05 DIAGNOSIS — Z955 Presence of coronary angioplasty implant and graft: Secondary | ICD-10-CM | POA: Insufficient documentation

## 2016-06-05 DIAGNOSIS — Z79899 Other long term (current) drug therapy: Secondary | ICD-10-CM | POA: Insufficient documentation

## 2016-06-05 DIAGNOSIS — IMO0002 Reserved for concepts with insufficient information to code with codable children: Secondary | ICD-10-CM

## 2016-06-05 DIAGNOSIS — I10 Essential (primary) hypertension: Secondary | ICD-10-CM | POA: Diagnosis present

## 2016-06-05 DIAGNOSIS — I69354 Hemiplegia and hemiparesis following cerebral infarction affecting left non-dominant side: Secondary | ICD-10-CM | POA: Insufficient documentation

## 2016-06-05 DIAGNOSIS — F329 Major depressive disorder, single episode, unspecified: Secondary | ICD-10-CM | POA: Insufficient documentation

## 2016-06-05 DIAGNOSIS — I110 Hypertensive heart disease with heart failure: Secondary | ICD-10-CM | POA: Diagnosis not present

## 2016-06-05 DIAGNOSIS — I5032 Chronic diastolic (congestive) heart failure: Secondary | ICD-10-CM | POA: Diagnosis not present

## 2016-06-05 DIAGNOSIS — Z794 Long term (current) use of insulin: Secondary | ICD-10-CM | POA: Insufficient documentation

## 2016-06-05 DIAGNOSIS — Z6820 Body mass index (BMI) 20.0-20.9, adult: Secondary | ICD-10-CM | POA: Insufficient documentation

## 2016-06-05 DIAGNOSIS — E1065 Type 1 diabetes mellitus with hyperglycemia: Secondary | ICD-10-CM

## 2016-06-05 DIAGNOSIS — Z7901 Long term (current) use of anticoagulants: Secondary | ICD-10-CM | POA: Insufficient documentation

## 2016-06-05 DIAGNOSIS — M069 Rheumatoid arthritis, unspecified: Secondary | ICD-10-CM | POA: Insufficient documentation

## 2016-06-05 DIAGNOSIS — N179 Acute kidney failure, unspecified: Secondary | ICD-10-CM | POA: Diagnosis not present

## 2016-06-05 DIAGNOSIS — E109 Type 1 diabetes mellitus without complications: Secondary | ICD-10-CM | POA: Diagnosis present

## 2016-06-05 DIAGNOSIS — I252 Old myocardial infarction: Secondary | ICD-10-CM | POA: Insufficient documentation

## 2016-06-05 DIAGNOSIS — E108 Type 1 diabetes mellitus with unspecified complications: Secondary | ICD-10-CM

## 2016-06-05 LAB — COMPREHENSIVE METABOLIC PANEL
ALT: 22 U/L (ref 14–54)
AST: 35 U/L (ref 15–41)
Albumin: 3.5 g/dL (ref 3.5–5.0)
Alkaline Phosphatase: 128 U/L — ABNORMAL HIGH (ref 38–126)
Anion gap: 13 (ref 5–15)
BUN: 16 mg/dL (ref 6–20)
CHLORIDE: 95 mmol/L — AB (ref 101–111)
CO2: 20 mmol/L — AB (ref 22–32)
Calcium: 9.3 mg/dL (ref 8.9–10.3)
Creatinine, Ser: 1.08 mg/dL — ABNORMAL HIGH (ref 0.44–1.00)
GFR, EST NON AFRICAN AMERICAN: 57 mL/min — AB (ref >60)
Glucose, Bld: 402 mg/dL — ABNORMAL HIGH (ref 65–99)
POTASSIUM: 4.8 mmol/L (ref 3.5–5.1)
SODIUM: 128 mmol/L — AB (ref 135–145)
Total Bilirubin: 0.6 mg/dL (ref 0.3–1.2)
Total Protein: 6.8 g/dL (ref 6.5–8.1)

## 2016-06-05 LAB — URINALYSIS, ROUTINE W REFLEX MICROSCOPIC
BILIRUBIN URINE: NEGATIVE
HGB URINE DIPSTICK: NEGATIVE
KETONES UR: 15 mg/dL — AB
LEUKOCYTES UA: NEGATIVE
Nitrite: NEGATIVE
PROTEIN: NEGATIVE mg/dL
Specific Gravity, Urine: 1.017 (ref 1.005–1.030)
pH: 5.5 (ref 5.0–8.0)

## 2016-06-05 LAB — BLOOD GAS, VENOUS
Acid-base deficit: 5.2 mmol/L — ABNORMAL HIGH (ref 0.0–2.0)
BICARBONATE: 18.4 meq/L — AB (ref 20.0–24.0)
DRAWN BY: 302821
FIO2: 0.21
O2 SAT: 81.9 %
PATIENT TEMPERATURE: 98.3
TCO2: 16.9 mmol/L (ref 0–100)
pCO2, Ven: 30.7 mmHg — ABNORMAL LOW (ref 45.0–50.0)
pH, Ven: 7.394 — ABNORMAL HIGH (ref 7.250–7.300)
pO2, Ven: 49.1 mmHg — ABNORMAL HIGH (ref 31.0–45.0)

## 2016-06-05 LAB — URINE MICROSCOPIC-ADD ON: RBC / HPF: NONE SEEN RBC/hpf (ref 0–5)

## 2016-06-05 LAB — CBC WITH DIFFERENTIAL/PLATELET
BASOS ABS: 0 10*3/uL (ref 0.0–0.1)
Basophils Relative: 0 %
Eosinophils Absolute: 0.1 10*3/uL (ref 0.0–0.7)
Eosinophils Relative: 1 %
HCT: 33.6 % — ABNORMAL LOW (ref 36.0–46.0)
Hemoglobin: 10.8 g/dL — ABNORMAL LOW (ref 12.0–15.0)
LYMPHS ABS: 2.5 10*3/uL (ref 0.7–4.0)
LYMPHS PCT: 35 %
MCH: 28.3 pg (ref 26.0–34.0)
MCHC: 32.1 g/dL (ref 30.0–36.0)
MCV: 88.2 fL (ref 78.0–100.0)
MONOS PCT: 5 %
Monocytes Absolute: 0.4 10*3/uL (ref 0.1–1.0)
NEUTROS ABS: 4.2 10*3/uL (ref 1.7–7.7)
NEUTROS PCT: 59 %
PLATELETS: 300 10*3/uL (ref 150–400)
RBC: 3.81 MIL/uL — AB (ref 3.87–5.11)
RDW: 16.8 % — ABNORMAL HIGH (ref 11.5–15.5)
WBC: 7.1 10*3/uL (ref 4.0–10.5)

## 2016-06-05 LAB — CBG MONITORING, ED
GLUCOSE-CAPILLARY: 402 mg/dL — AB (ref 65–99)
Glucose-Capillary: 531 mg/dL (ref 65–99)

## 2016-06-05 MED ORDER — SODIUM CHLORIDE 0.9 % IV SOLN
INTRAVENOUS | Status: DC
  Administered 2016-06-05: 4.5 [IU]/h via INTRAVENOUS
  Filled 2016-06-05: qty 2.5

## 2016-06-05 MED ORDER — DEXTROSE-NACL 5-0.45 % IV SOLN: INTRAVENOUS | Status: DC

## 2016-06-05 MED ORDER — SODIUM CHLORIDE 0.9 % IV BOLUS (SEPSIS): 1000.0000 mL | Freq: Once | INTRAVENOUS | Status: DC

## 2016-06-05 MED ORDER — SODIUM CHLORIDE 0.9 % IV BOLUS (SEPSIS)
500.0000 mL | Freq: Once | INTRAVENOUS | Status: AC
  Administered 2016-06-05: 500 mL via INTRAVENOUS

## 2016-06-05 NOTE — ED Notes (Signed)
Attempted to start IV x2 but unsuccessful. IV team notified. Pt has a hx of having difficult IV starts.

## 2016-06-05 NOTE — ED Notes (Signed)
Bed: XH37 Expected date:  Expected time:  Means of arrival:  Comments: EMS 55yo F hyperglycemia

## 2016-06-05 NOTE — ED Notes (Signed)
cbg 509 

## 2016-06-05 NOTE — ED Notes (Signed)
Pt brought in by EMS from [facility] with hyperglycemia. Pt was given 18 units of novolog insulin prior to leaving facility. EMS glucose check is 503. Pt discharged from hospital on 7/17.

## 2016-06-05 NOTE — ED Provider Notes (Signed)
CSN: [id number]     Arrival date & time 06/05/16  1924 History   First MD Initiated Contact with Patient 06/05/16 1935     Chief Complaint  Patient presents with  . Hyperglycemia   HPI  Madison [name] is a 55 year old female with past medical history of recurrent DKA presenting today with hyperglycemia. Patient was discharged from the hospital yesterday for DKA. She reports compliance with her insulin. Patient is a resident of [facility]. Her blood sugar read "high" today. She was given 18 units of NovoLog prior to leaving the assisted living facility. On EMS arrival, his glucose was 503. Unsure if this was before or after the insulin. Glucose on arrival is 400. Patient states she is in her normal state of health and has no physical complaints. Denies headache, chest pain, shortness of breath, abdominal pain, nausea, vomiting diarrhea or myalgias.  Past Medical History  Diagnosis Date  . CAD (coronary artery disease)   . Obesity   . Hypercholesteremia   . HTN (hypertension)   . Dyslipidemia   . Aortic stenosis   . Thrombophlebitis   . Heart murmur   . Myocardial infarction ([place]) 12/2014  . Pneumonia 11/2014; 12/2014  . GERD (gastroesophageal reflux disease)   . Stroke syndrome [facility]) 1995; 2001    "when my son was born; problems w/speech and L hand since then" (01/19/2015)  . Anxiety   . Depression   . Allergy   . CHF (congestive heart failure) ([place])   . Rheumatoid arthritis(714.0)     "hands" (07/01/2015)  . Diabetes ([place]) dx'd 1982    "went straight to insulin"   Past Surgical History  Procedure Laterality Date  . Cesarean section  1995  . Foot fracture surgery    . Knee arthroscopy Right   . Neuroplasty / transposition median nerve at carpal tunnel    . Left heart catheterization with coronary angiogram N/A 12/08/2014    Procedure: LEFT HEART CATHETERIZATION WITH CORONARY ANGIOGRAM;  Surgeon: Wellington Hampshire, MD;  Location: [place] CATH LAB;  Service: Cardiovascular;   Laterality: N/A;  . Percutaneous coronary stent intervention (pci-s) N/A 12/09/2014    Procedure: PERCUTANEOUS CORONARY STENT INTERVENTION (PCI-S);  Surgeon: Leonie Man, MD;  Location: [facility] CATH LAB;  Service: Cardiovascular;  Laterality: N/A;  . Cardiac catheterization  12/08/2014  . Coronary angioplasty with stent placement  12/09/2014  . Aortic valve replacement (avr)/coronary artery bypass grafting (cabg)  "2014"    Archie Endo 03/08/2014  . Fracture surgery    . Tubal ligation  1995  . Cataract extraction Left   . Coronary artery bypass graft    . Orif ankle fracture Left 07/04/2015    Procedure: OPEN REDUCTION INTERNAL FIXATION (ORIF)  TRIMAL ANKLE FRACTURE;  Surgeon: Leandrew Koyanagi, MD;  Location: [place];  Service: Orthopedics;  Laterality: Left;   Family History  Problem Relation Age of Onset  . Stroke    . Heart disease Mother   . Heart disease Sister    Social History  Substance Use Topics  . Smoking status: Never Smoker   . Smokeless tobacco: Never Used  . Alcohol Use: No   OB History    No data available     Review of Systems  All other systems reviewed and are negative.     Allergies  Adhesive; Celebrex; and Detrol  Home Medications   Prior to Admission medications   Medication Sig Start Date End Date Taking? Authorizing Provider  citalopram (CELEXA) 40 MG  tablet Take 1 tablet (40 mg total) by mouth daily. 03/08/16  Yes Dorothyann Peng, NP  enoxaparin (LOVENOX) 40 MG/0.4ML injection Inject 40 mg into the skin once.    Yes Historical Provider, MD  furosemide (LASIX) 20 MG tablet Take 1 tablet (20 mg total) by mouth daily. 05/22/16  Yes Domenic Polite, MD  insulin aspart (NOVOLOG FLEXPEN) 100 UNIT/ML FlexPen Inject 0-9 Units into the skin 3 (three) times daily with meals. CBG < 70: eat or drink something and recheck, CBG 70 - 120: 0 units CBG 121 - 150: 2 unit CBG 151 - 200: 3 units CBG 201 - 250: 5 units CBG 251 - 300:  6units CBG 301 - 350:  7units CBG 351 - 400: 10  units CBG > 400: call MD. Patient taking differently: Inject 3 Units into the skin 3 (three) times daily with meals.  04/30/16  Yes Dorothyann Peng, NP  insulin detemir (LEVEMIR) 100 UNIT/ML injection Inject 0.08 mLs (8 Units total) into the skin daily. Patient taking differently: Inject 5 Units into the skin 2 (two) times daily.  05/27/16  Yes Velvet Bathe, MD  Vitamin D, Ergocalciferol, (DRISDOL) 50000 units CAPS capsule Take 50,000 Units by mouth every 7 (seven) days.   Yes Historical Provider, MD  warfarin (COUMADIN) 5 MG tablet Take 5 mg by mouth as directed. Instructions: Take 1 tablet daily for 2 Days then Recheck PT/INR on 06/07/16.   Yes Historical Provider, MD  Blood Glucose Monitoring Suppl ([place]) w/Device KIT Use to check glucose 2x per day 03/16/16   Dorothyann Peng, NP  feeding supplement, GLUCERNA SHAKE, (GLUCERNA SHAKE) LIQD Take 237 mLs by mouth 3 (three) times daily between meals. 06/04/16   Nishant Dhungel, MD  glucose blood test strip Use as instructed Patient taking differently: 1 each by Other route as needed (as instructed). Use as instructed 03/16/16   Dorothyann Peng, NP  omeprazole (PRILOSEC) 40 MG capsule TAKE ONE CAPSULE BY MOUTH EVERY DAY Patient not taking: Reported on 06/05/2016 03/20/16   Dorothyann Peng, NP   BP 120/58 mmHg  Pulse 89  Temp(Src) 98.3 F (36.8 C)  Resp 16  SpO2 97% Physical Exam  Constitutional: She appears well-developed and well-nourished. No distress.  HENT:  Head: Normocephalic and atraumatic.  Eyes: Conjunctivae are normal. Right eye exhibits no discharge. Left eye exhibits no discharge. No scleral icterus.  Neck: Normal range of motion.  Cardiovascular: Normal rate, regular rhythm and normal heart sounds.   Pulmonary/Chest: Effort normal and breath sounds normal. No respiratory distress.  Abdominal: Soft. Bowel sounds are normal. She exhibits no distension. There is no tenderness.  Musculoskeletal: Normal range of motion.   Neurological: She is alert. Coordination normal.  Skin: Skin is warm and dry.  Psychiatric: She has a normal mood and affect. Her behavior is normal.  Nursing note and vitals reviewed.   ED Course  Procedures (including critical care time) Labs Review Labs Reviewed  COMPREHENSIVE METABOLIC PANEL - Abnormal; Notable for the following:    Sodium 128 (*)    Chloride 95 (*)    CO2 20 (*)    Glucose, Bld 402 (*)    Creatinine, Ser 1.08 (*)    Alkaline Phosphatase 128 (*)    GFR calc non Af Amer 57 (*)    All other components within normal limits  CBC WITH DIFFERENTIAL/PLATELET - Abnormal; Notable for the following:    RBC 3.81 (*)    Hemoglobin 10.8 (*)    HCT 33.6 (*)  RDW 16.8 (*)    All other components within normal limits  BLOOD GAS, VENOUS - Abnormal; Notable for the following:    pH, Ven 7.394 (*)    pCO2, Ven 30.7 (*)    pO2, Ven 49.1 (*)    Bicarbonate 18.4 (*)    Acid-base deficit 5.2 (*)    All other components within normal limits  URINALYSIS, ROUTINE W REFLEX MICROSCOPIC (NOT AT [facility]) - Abnormal; Notable for the following:    Glucose, UA >1000 (*)    Ketones, ur 15 (*)    All other components within normal limits  URINE MICROSCOPIC-ADD ON - Abnormal; Notable for the following:    Squamous Epithelial / LPF 0-5 (*)    Bacteria, UA RARE (*)    All other components within normal limits  CBG MONITORING, ED - Abnormal; Notable for the following:    Glucose-Capillary 402 (*)    All other components within normal limits  CBG MONITORING, ED - Abnormal; Notable for the following:    Glucose-Capillary 531 (*)    All other components within normal limits    Imaging Review No results found. I have personally reviewed and evaluated these images and lab results as part of my medical decision-making.   EKG Interpretation None      MDM   Final diagnoses:  Hyperglycemia   55 year old female presenting with hyperglycemia. Discharged from hospital yesterday for  DKA. Blood sugar at nursing facility was in 500s. Patient received 18 units of NovoLog from nursing facility before EMS transport. Blood glucose on presentation to ER is 402. Gentle hydration due to CHF. No further insulin at this time until repeat blood glucose. Patient does not appear to be in DKA currently. Slight AKI. Repeat blood glucose after monitoring elevated back to 531. Started on [employer]. Consult hospitalist for admission for uncontrolled hyperglycemia. Dr. Loleta Books accepting.    Lahoma Crocker Maygan Koeller, PA-C 06/06/16 0017  Forde Dandy, MD 06/06/16 1355

## 2016-06-06 ENCOUNTER — Encounter (HOSPITAL_COMMUNITY): Payer: Self-pay | Admitting: Family Medicine

## 2016-06-06 DIAGNOSIS — I5032 Chronic diastolic (congestive) heart failure: Secondary | ICD-10-CM | POA: Diagnosis not present

## 2016-06-06 DIAGNOSIS — I10 Essential (primary) hypertension: Secondary | ICD-10-CM | POA: Diagnosis not present

## 2016-06-06 DIAGNOSIS — E109 Type 1 diabetes mellitus without complications: Secondary | ICD-10-CM

## 2016-06-06 DIAGNOSIS — E871 Hypo-osmolality and hyponatremia: Secondary | ICD-10-CM

## 2016-06-06 DIAGNOSIS — R739 Hyperglycemia, unspecified: Secondary | ICD-10-CM | POA: Diagnosis not present

## 2016-06-06 DIAGNOSIS — E440 Moderate protein-calorie malnutrition: Secondary | ICD-10-CM

## 2016-06-06 DIAGNOSIS — D649 Anemia, unspecified: Secondary | ICD-10-CM

## 2016-06-06 LAB — CBG MONITORING, ED
GLUCOSE-CAPILLARY: 130 mg/dL — AB (ref 65–99)
GLUCOSE-CAPILLARY: 337 mg/dL — AB (ref 65–99)
Glucose-Capillary: 179 mg/dL — ABNORMAL HIGH (ref 65–99)

## 2016-06-06 LAB — BASIC METABOLIC PANEL
ANION GAP: 12 (ref 5–15)
BUN: 11 mg/dL (ref 6–20)
CALCIUM: 8.9 mg/dL (ref 8.9–10.3)
CO2: 22 mmol/L (ref 22–32)
Chloride: 98 mmol/L — ABNORMAL LOW (ref 101–111)
Creatinine, Ser: 0.71 mg/dL (ref 0.44–1.00)
GFR calc Af Amer: >60 mL/min (ref >60)
GFR calc non Af Amer: >60 mL/min (ref >60)
GLUCOSE: 192 mg/dL — AB (ref 65–99)
POTASSIUM: 4.5 mmol/L (ref 3.5–5.1)
Sodium: 132 mmol/L — ABNORMAL LOW (ref 135–145)

## 2016-06-06 LAB — GLUCOSE, CAPILLARY
GLUCOSE-CAPILLARY: 107 mg/dL — AB (ref 65–99)
GLUCOSE-CAPILLARY: 203 mg/dL — AB (ref 65–99)
GLUCOSE-CAPILLARY: 509 mg/dL — AB (ref 65–99)
GLUCOSE-CAPILLARY: 99 mg/dL (ref 65–99)
Glucose-Capillary: 87 mg/dL (ref 65–99)
Glucose-Capillary: 189 mg/dL — ABNORMAL HIGH (ref 65–99)
Glucose-Capillary: 162 mg/dL — ABNORMAL HIGH (ref 65–99)
Glucose-Capillary: 134 mg/dL — ABNORMAL HIGH (ref 65–99)
Glucose-Capillary: 284 mg/dL — ABNORMAL HIGH (ref 65–99)
Glucose-Capillary: 177 mg/dL — ABNORMAL HIGH (ref 65–99)
Glucose-Capillary: 303 mg/dL — ABNORMAL HIGH (ref 65–99)

## 2016-06-06 LAB — ACTH STIMULATION, 3 TIME POINTS
CORTISOL 30 MIN: 20.2 ug/dL
CORTISOL 60 MIN: 26.4 ug/dL
Cortisol, Base: 9.4 ug/dL

## 2016-06-06 LAB — PROTIME-INR
INR: 1.54 — ABNORMAL HIGH (ref 0.00–1.49)
PROTHROMBIN TIME: 18.5 s — AB (ref 11.6–15.2)

## 2016-06-06 LAB — TROPONIN I

## 2016-06-06 LAB — C-REACTIVE PROTEIN: CRP: 1 mg/dL — ABNORMAL HIGH (ref <1.0)

## 2016-06-06 LAB — SEDIMENTATION RATE: Sed Rate: 29 mm/hr — ABNORMAL HIGH (ref 0–22)

## 2016-06-06 LAB — T4, FREE: Free T4: 0.85 ng/dL (ref 0.61–1.12)

## 2016-06-06 LAB — TSH: TSH: 1.484 u[IU]/mL (ref 0.350–4.500)

## 2016-06-06 MED ORDER — INSULIN REGULAR HUMAN 100 UNIT/ML IJ SOLN
INTRAMUSCULAR | Status: DC
  Filled 2016-06-06: qty 2.5

## 2016-06-06 MED ORDER — SODIUM CHLORIDE 0.9 % IV SOLN: INTRAVENOUS | Status: DC

## 2016-06-06 MED ORDER — ACETAMINOPHEN 325 MG PO TABS
650.0000 mg | ORAL_TABLET | Freq: Four times a day (QID) | ORAL | Status: DC | PRN
Start: 2016-06-06 — End: 2016-06-08
  Administered 2016-06-07: 650 mg via ORAL
  Filled 2016-06-06: qty 2

## 2016-06-06 MED ORDER — FUROSEMIDE 20 MG PO TABS
20.0000 mg | ORAL_TABLET | Freq: Every day | ORAL | Status: DC
  Administered 2016-06-06 – 2016-06-08 (×3): 20 mg via ORAL
  Filled 2016-06-06 (×3): qty 1

## 2016-06-06 MED ORDER — COSYNTROPIN 0.25 MG IJ SOLR: 0.2500 mg | Freq: Once | INTRAMUSCULAR | Status: DC

## 2016-06-06 MED ORDER — WARFARIN - PHARMACIST DOSING INPATIENT: Freq: Every day | Status: DC

## 2016-06-06 MED ORDER — WARFARIN SODIUM 5 MG PO TABS
7.5000 mg | ORAL_TABLET | Freq: Once | ORAL | Status: AC
  Administered 2016-06-06: 7.5 mg via ORAL
  Filled 2016-06-06: qty 1

## 2016-06-06 MED ORDER — COSYNTROPIN 0.25 MG IJ SOLR
0.2500 mg | Freq: Once | INTRAMUSCULAR | Status: AC
  Administered 2016-06-06: 0.25 mg via INTRAVENOUS
  Filled 2016-06-06: qty 0.25

## 2016-06-06 MED ORDER — ENOXAPARIN SODIUM 60 MG/0.6ML ~~LOC~~ SOLN
50.0000 mg | SUBCUTANEOUS | Status: AC
  Administered 2016-06-06: 50 mg via SUBCUTANEOUS
  Filled 2016-06-06: qty 0.6

## 2016-06-06 MED ORDER — INSULIN REGULAR BOLUS VIA INFUSION
0.0000 [IU] | Freq: Three times a day (TID) | INTRAVENOUS | Status: DC
  Administered 2016-06-06: 2.1 [IU] via INTRAVENOUS
  Filled 2016-06-06: qty 10

## 2016-06-06 MED ORDER — WARFARIN SODIUM 5 MG PO TABS: 5.0000 mg | ORAL_TABLET | Freq: Once | ORAL | Status: DC

## 2016-06-06 MED ORDER — CITALOPRAM HYDROBROMIDE 40 MG PO TABS
40.0000 mg | ORAL_TABLET | Freq: Every day | ORAL | Status: DC
  Administered 2016-06-06 – 2016-06-08 (×3): 40 mg via ORAL
  Filled 2016-06-06 (×3): qty 1

## 2016-06-06 MED ORDER — ENOXAPARIN SODIUM 60 MG/0.6ML ~~LOC~~ SOLN
50.0000 mg | Freq: Two times a day (BID) | SUBCUTANEOUS | Status: DC
  Administered 2016-06-06 – 2016-06-08 (×4): 50 mg via SUBCUTANEOUS
  Filled 2016-06-06 (×4): qty 0.6

## 2016-06-06 MED ORDER — INSULIN ASPART 100 UNIT/ML ~~LOC~~ SOLN
5.0000 [IU] | Freq: Three times a day (TID) | SUBCUTANEOUS | Status: DC
  Administered 2016-06-06 – 2016-06-07 (×3): 5 [IU] via SUBCUTANEOUS

## 2016-06-06 MED ORDER — INSULIN ASPART 100 UNIT/ML ~~LOC~~ SOLN
0.0000 [IU] | Freq: Three times a day (TID) | SUBCUTANEOUS | Status: DC
Start: 2016-06-06 — End: 2016-06-07
  Administered 2016-06-06: 2 [IU] via SUBCUTANEOUS
  Administered 2016-06-06 – 2016-06-07 (×2): 5 [IU] via SUBCUTANEOUS

## 2016-06-06 MED ORDER — ACETAMINOPHEN 650 MG RE SUPP: 650.0000 mg | Freq: Four times a day (QID) | RECTAL | Status: DC | PRN

## 2016-06-06 MED ORDER — DEXTROSE 50 % IV SOLN: 25.0000 mL | INTRAVENOUS | Status: DC | PRN

## 2016-06-06 MED ORDER — INSULIN DETEMIR 100 UNIT/ML ~~LOC~~ SOLN
8.0000 [IU] | Freq: Every day | SUBCUTANEOUS | Status: DC
  Administered 2016-06-06: 8 [IU] via SUBCUTANEOUS
  Filled 2016-06-06: qty 0.08

## 2016-06-06 MED ORDER — INSULIN DETEMIR 100 UNIT/ML ~~LOC~~ SOLN
8.0000 [IU] | Freq: Two times a day (BID) | SUBCUTANEOUS | Status: DC
  Administered 2016-06-06 – 2016-06-07 (×2): 8 [IU] via SUBCUTANEOUS
  Filled 2016-06-06 (×2): qty 0.08

## 2016-06-06 MED ORDER — DEXTROSE-NACL 5-0.45 % IV SOLN
INTRAVENOUS | Status: DC
  Administered 2016-06-06: 02:00:00 via INTRAVENOUS

## 2016-06-06 MED ORDER — GLUCERNA SHAKE PO LIQD
237.0000 mL | Freq: Three times a day (TID) | ORAL | Status: DC
  Administered 2016-06-06 – 2016-06-08 (×5): 237 mL via ORAL
  Filled 2016-06-06 (×9): qty 237

## 2016-06-06 NOTE — Progress Notes (Signed)
Inpatient Diabetes Program Recommendations  AACE/ADA: New Consensus Statement on Inpatient Glycemic Control (2015)  Target Ranges:  Prepandial:   less than 140 mg/dL      Peak postprandial:   less than 180 mg/dL (1-2 hours)      Critically ill patients:  140 - 180 mg/dL   Lab Results  Component Value Date   GLUCAP 99 06/06/2016   HGBA1C 8.6* 06/01/2016   Results for BLAISE, PALLADINO (MRN [id number]) as of 06/06/2016 09:18  Ref. Range 06/06/2016 05:05 06/06/2016 06:11 06/06/2016 07:15 06/06/2016 08:13 06/06/2016 09:12  Glucose-Capillary Latest Ref Range: 65-99 mg/dL 189 (H) 203 (H) 162 (H) 134 (H) 99  Results for CARLOS, QUACKENBUSH (MRN [id number]) as of 06/06/2016 09:18  Ref. Range 06/06/2016 06:55  Sodium Latest Ref Range: 135-145 mmol/L 132 (L)  Potassium Latest Ref Range: 3.5-5.1 mmol/L 4.5  Chloride Latest Ref Range: 101-111 mmol/L 98 (L)  CO2 Latest Ref Range: 22-32 mmol/L 22  BUN Latest Ref Range: 6-20 mg/dL 11  Creatinine Latest Ref Range: 0.44-1.00 mg/dL 0.71  Calcium Latest Ref Range: 8.9-10.3 mg/dL 8.9  EGFR (Non-African Amer.) Latest Ref Range: >60 mL/min >60  EGFR (African American) Latest Ref Range: >60 mL/min >60  Glucose Latest Ref Range: 65-99 mg/dL 192 (H)  Anion gap Latest Ref Range: 5-15  12  Results for MALAYIA, SPIZZIRRI (MRN [id number]) as of 06/06/2016 09:18  Ref. Range 06/01/2016 11:38  Hemoglobin A1C Latest Ref Range: 4.8-5.6 % 8.6 (H)   Review of Glycemic Control Ready for transition off drip.  Diabetes history: DM1 Outpatient Diabetes medications: Levemir 5 units bid, Novolog 0-9 units tidwc Current orders for Inpatient glycemic control: Levemir 8 units QD  Inpatient Diabetes Program Recommendations:    Levemir 5 units bid Novolog sensitive tidwc and hs Novolog 2 units tidwc for meal coverage insulin  Will continue to follow. Thank you. Lorenda Peck, RD, LDN, CDE Inpatient Diabetes Coordinator [phone]

## 2016-06-06 NOTE — H&P (Signed)
History and Physical  Patient Name: Madison [name]     GPQ:[id number]    DOB: 13-Aug-1961    DOA: 06/05/2016 PCP: Madison Peng, NP   Patient coming from: [facility]  Chief Complaint: Hyperglycemia  HPI: Madison [name] is a 54 y.o. female with a past medical history significant for IDDM with recurrent DKA in the last 6 months, previous strokes with residual L sided weakness/dysarthria, and hx of AV repair on warfarin who presents with hyperglycemia.  The patient was just discharged from the hospital yesterday after being admitted for DKA again.  On the day of discharge, her BG was 72, she was on a strict diabetic diet, and had been getting Levemir 20 units daily with sliding scale corrections.  She felt fine at discharge and after and describes that she "ate nothing other than what they gave me" and got all her insulin shots at the SNF yesterday.  However, today her BG was >500, so they called the doctor at the SNF who recommended she be sent to the ER.    ED course: -Afebrile, heart rate and respirations normal, blood pressure stable -Na 128, K 4.8, Cr 1.08 (baseline 0.8), WBC 7.1 K, Hgb 10.8 -Blood glucose is greater than 400 -Anion gap and pH were normal     The patient has been admitted 13 times since January for hyperglycemia.  During that time, she has had four admissions to ICU, typically for obtundation requiring intubation or hypotension requiring pressors.  Per chart review, she was followed by Madison [name] and on a pump until around 2014, and on basal bolus regimens since then.  She was living independently until this month, when she was placed at [name] because her DKA was presumed to be from poor adherence.  Most recently, she was admitted for DKA on 6/29 to 7/6 (lactate 6.5, pH 7.1), required brief pressors, stabilized and transferred to [facility].  During that hospitalization, she was treated initially with vanc/cefepime, stopped and sepsis was ruled out, all cultures negative.   On 7/6 she was finally amenable to nursing home placement and was discharged to SNF.  However, she was readmitted within 24 hours with BG >1000, K 6.7, and bicarb <7.    She was able to last five days out of the hospital after that admission, but was readmitted 4 days ago on 7/14 again in DKA.  She was started on insulin drip per protocol overnight, but had BG 15 mg/dL in the morning.  Review of BG trends during last four hospitalizations shows that she regularly has very wide swings in blood glucose.       ROS: Pt complains of nothing.  Pt denies any fever, chills, sweats.  She has no skin sores, skin breakdown. She denies cough, purulent sputum, chest pain. She denies abdominal pain, pelvic discomfort.    All other systems negative except as just noted or noted in the history of present illness.    Past Medical History  Diagnosis Date  . CAD (coronary artery disease)   . Obesity   . Hypercholesteremia   . HTN (hypertension)   . Dyslipidemia   . Aortic stenosis   . Thrombophlebitis   . Heart murmur   . Myocardial infarction ([place]) 12/2014  . Pneumonia 11/2014; 12/2014  . GERD (gastroesophageal reflux disease)   . Stroke syndrome [facility]) 1995; 2001    "when my son was born; problems w/speech and L hand since then" (01/19/2015)  . Anxiety   . Depression   .  Allergy   . CHF (congestive heart failure) ([place])   . Rheumatoid arthritis(714.0)     "hands" (07/01/2015)  . Diabetes ([place]) dx'd 1982    "went straight to insulin"    Past Surgical History  Procedure Laterality Date  . Cesarean section  1995  . Foot fracture surgery    . Knee arthroscopy Right   . Neuroplasty / transposition median nerve at carpal tunnel    . Left heart catheterization with coronary angiogram N/A 12/08/2014    Procedure: LEFT HEART CATHETERIZATION WITH CORONARY ANGIOGRAM;  Surgeon: Madison Hampshire, Madison [name];  Location: [place] CATH LAB;  Service: Cardiovascular;  Laterality: N/A;  . Percutaneous coronary stent  intervention (pci-s) N/A 12/09/2014    Procedure: PERCUTANEOUS CORONARY STENT INTERVENTION (PCI-S);  Surgeon: Madison Man, Madison [name];  Location: [facility] CATH LAB;  Service: Cardiovascular;  Laterality: N/A;  . Cardiac catheterization  12/08/2014  . Coronary angioplasty with stent placement  12/09/2014  . Aortic valve replacement (avr)/coronary artery bypass grafting (cabg)  "2014"    Madison [name] 03/08/2014  . Fracture surgery    . Tubal ligation  1995  . Cataract extraction Left   . Coronary artery bypass graft    . Orif ankle fracture Left 07/04/2015    Procedure: OPEN REDUCTION INTERNAL FIXATION (ORIF)  TRIMAL ANKLE FRACTURE;  Surgeon: Madison Koyanagi, Madison [name];  Location: [place];  Service: Orthopedics;  Laterality: Left;    Social History: Patient lives at [place], but lived independently before this month.  The patient walks with a walker.  She is not a smoker.  She has no dementia.    Allergies  Allergen Reactions  . Adhesive [Tape] Other (See Comments)    Can burn skin if left on too long  . Celebrex [Celecoxib] Rash  . Detrol [Tolterodine] Hives    Family history: family history includes Heart disease in her mother and sister.  Prior to Admission medications   Medication Sig Start Date End Date Taking? Authorizing Provider  citalopram (CELEXA) 40 MG tablet Take 1 tablet (40 mg total) by mouth daily. 03/08/16  Yes Madison Peng, NP  enoxaparin (LOVENOX) 40 MG/0.4ML injection Inject 40 mg into the skin once.    Yes Historical Provider, Madison [name]  furosemide (LASIX) 20 MG tablet Take 1 tablet (20 mg total) by mouth daily. 05/22/16  Yes Madison Polite, Madison [name]  insulin aspart (NOVOLOG FLEXPEN) 100 UNIT/ML FlexPen Inject 0-9 Units into the skin 3 (three) times daily with meals. CBG < 70: eat or drink something and recheck, CBG 70 - 120: 0 units CBG 121 - 150: 2 unit CBG 151 - 200: 3 units CBG 201 - 250: 5 units CBG 251 - 300:  6units CBG 301 - 350:  7units CBG 351 - 400: 10 units CBG > 400: call Madison [name]. Patient  taking differently: Inject 3 Units into the skin 3 (three) times daily with meals.  04/30/16  Yes Madison Peng, NP  insulin detemir (LEVEMIR) 100 UNIT/ML injection Inject 0.08 mLs (8 Units total) into the skin daily. Patient taking differently: Inject 5 Units into the skin 2 (two) times daily.  05/27/16  Yes Velvet Bathe, Madison [name]  Vitamin D, Ergocalciferol, (DRISDOL) 50000 units CAPS capsule Take 50,000 Units by mouth every 7 (seven) days.   Yes Historical Provider, Madison [name]  warfarin (COUMADIN) 5 MG tablet Take 5 mg by mouth as directed. Instructions: Take 1 tablet daily for 2 Days then Recheck PT/INR on 06/07/16.   Yes Historical Provider, Madison [name]  Blood Glucose Monitoring Suppl (ONE  Moody) w/Device KIT Use to check glucose 2x per day 03/16/16   Madison Peng, NP  feeding supplement, GLUCERNA SHAKE, (GLUCERNA SHAKE) LIQD Take 237 mLs by mouth 3 (three) times daily between meals. 06/04/16   Nishant Dhungel, Madison [name]  glucose blood test strip Use as instructed Patient taking differently: 1 each by Other route as needed (as instructed). Use as instructed 03/16/16   Madison Peng, NP  omeprazole (PRILOSEC) 40 MG capsule TAKE ONE CAPSULE BY MOUTH EVERY DAY Patient not taking: Reported on 06/05/2016 03/20/16   Madison Peng, NP       Physical Exam: BP 120/58 mmHg  Pulse 89  Temp(Src) 98.3 F (36.8 C)  Resp 16  SpO2 97% General appearance: Frail elderly female, alert and in no acute distress.   Eyes: Conjunctiva normal, lids and lashes normal.   Pupils equal, slightly ovoid, barely reactive.  ENT: No nasal deformity, discharge.  OP moist without lesions.   Lymph: No cervical or supraclavicular lymphadenopathy. Skin: Warm and dry.  No suspicious rashes or lesions.  No ulcers or sores on feet, toes, heels, legs, abdomen, chest, arms, neck, face. Cardiac: RRR, nl S1-S2, mechanical SEM.  Capillary refill is brisk.  JVP normal.  No LE edema.  Radial and DP pulses 2+ and symmetric. Respiratory: Normal respiratory  rate and rhythm.  CTAB without rales or wheezes. GI: Abdomen soft without rigidity.  No TTP or rebound at all. No ascites, distension, hepatosplenomegaly.   MSK: No deformities or effusions.  No clubbing/cyanosis. Neuro: Cranial nerves normal. Sensorium intact and responding to questions, attention normal.  Speech is sghtly slurred.  Mild LEFT sided contractures, residual weakness.  Psych: Affect pleasant.  Judgment and insight appear normal.       Labs on Admission:  I have personally reviewed following labs and imaging studies: CBC:  Recent Labs Lab 06/01/16 1138 06/02/16 0305 06/05/16 2042  WBC 14.4* 15.2* 7.1  NEUTROABS 12.7*  --  4.2  HGB 10.3* 9.1* 10.8*  HCT 33.8* 27.1* 33.6*  MCV 93.6 86.3 88.2  PLT 380 313 078   Basic Metabolic Panel:  Recent Labs Lab 06/02/16 0305 06/02/16 0926 06/02/16 1224 06/02/16 1622 06/05/16 2042  NA 135 134* 133* 134* 128*  K 3.9 4.1 4.2 4.4 4.8  CL 105 106 103 105 95*  CO2 20* 22 22 20* 20*  GLUCOSE 146* 82 99 102* 402*  BUN '11 9 8 7 16  ' CREATININE 0.78 0.74 0.66 0.84 1.08*  CALCIUM 8.4* 8.4* 8.7* 8.5* 9.3   GFR: Estimated Creatinine Clearance: 48.4 mL/min (by C-G formula based on Cr of 1.08).  Liver Function Tests:  Recent Labs Lab 06/01/16 1138 06/02/16 0305 06/05/16 2042  AST 22 20 35  ALT 15 12* 22  ALKPHOS 108 84 128*  BILITOT 1.8* 0.9 0.6  PROT 6.8 5.4* 6.8  ALBUMIN 3.6 2.8* 3.5   Coagulation Profile:  Recent Labs Lab 06/01/16 1138 06/02/16 0305 06/03/16 0313 06/04/16 0529  INR 1.31 1.29 2.13* 2.79*      Noon cortisol 6.1ug/dL  06/02/16 PM cortisol 40 ug/dL   03/17/16   TSH  0.336 uIU/mL    02/26/16 Free T4  0.90    02/26/16    CBG:  Recent Labs Lab 06/04/16 0752 06/04/16 1148 06/04/16 1209 06/05/16 1937 06/05/16 2233  GLUCAP 268* 62* 72 402* 531*    Recent Results (from the past 240 hour(s))  Culture, blood (routine x 2)     Status: None (Preliminary result)   Collection  Time: 06/02/16   9:26 AM  Result Value Ref Range Status   Specimen Description BLOOD LEFT [facility]  Final   Special Requests IN PEDIATRIC BOTTLE [place]  Final   Culture   Final    NO GROWTH 3 DAYS Performed at [facility]    Report Status PENDING  Incomplete  Culture, blood (routine x 2)     Status: None (Preliminary result)   Collection Time: 06/02/16 12:24 PM  Result Value Ref Range Status   Specimen Description BLOOD LEFT [facility]  Final   Special Requests IN PEDIATRIC BOTTLE [facility]  Final   Culture   Final    NO GROWTH 3 DAYS Performed at [facility]    Report Status PENDING  Incomplete         Radiological Exams on Admission: Personally reviewed CXR from 7/14, 7/7, 7/1, 6/29, 5/8: No persistent focal opacity, no persistent effusion.  No pneumonia,effusion or abscess on 7/14.   CT abdomen and pelvis without contrast in March 2017: Negative for abscess  CT abdomen/pelvis WITH contrast 03/24/16: Negative for abscess but "Wall thickening around the esophagus may indicate reflux disease. Esophageal mass is not entirely excluded. Consider endoscopy if clinically indicated."    Echocardiogram 04/2016: EF 73-41%, grade 2 diastolic dysfunction. Aortic valve prosthesis functional.               Assessment/Plan 1. 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 Review of glucose trends during last four hospitalizations shows even with strict diet and in hospital monitoring, she has large swings in blood glucose (e.g. from 80s to 400s) within even a 12 hour period.    -Previous cortisols either normal or near normal.   -Previous TSH normal.  -With respect to occult infection, she has had leukocytosis during previous hospitalizations, attributed to DKA, and no obvious source and is asymptomatic currently. CXR is clear. Previous urine and blood cultures are without growth over last two months. No abdominal  pain, pelvic pain or flank pain, no joint pain.  No pain in left ankle at site of previous ORIF and orthopedic hardware.  -Insulin drip for now  -Leave on glucostabilizer protocol with bolus of insulin at mealtime and quantify 24 hour insulin requirements before transition to basal bolus regimen    -Check TSH and free T4 again  -Check cosyntropin stim protocol  -Check ESR and CRP and blood cultures for occult infection   -If testing all normal, will discuss CT findings in esophagus with GI for possible endoscopy vs reimaging   2. Hyponatremia:  Corrects to near normal/baseline for patient with CHF.  3. Elevated creatinine:  Baseline Cr 0.8, now slightly elevated in setting of hyperglycemia. -Repeat BMP tomorrow  4. Chronic diastolic CHF and HTN:  -Continue furosemide daily  5. Chronic normocytic anemia:  Stable  6. History of AVR and stroke: -Check INR now and continue warfarin dosed per pharmacy  7. Chronic malnutrition, moderate degree: -Continue glucerna  8. Hx of depression: -Continue Celexa    DVT prophylaxis: Not needed, on warfarin  Code Status: FULL  Family Communication: None present  Disposition Plan: Anticipate IV fluids and insulin drip for 24 hours to quantify insulin needs.  Testing of adrenals and thyroid in morning.  If lab tests normal, likely will need quantification of insulin regimen, possible phone consultation with her primary endocrinologist.  Consults called: None Admission status: OBS, med surg At the point of  initial evaluation, it is my clinical opinion that admission for OBSERVATION is reasonable and necessary because the patient's presenting complaints in the context of their chronic conditions represent sufficient risk of deterioration or significant morbidity to constitute reasonable grounds for close observation in the hospital setting, but that the patient may be medically stable for discharge from the hospital within 24 to 48 hours.     Medical decision making: Patient seen at 11:45 PM on 06/05/2016.  The patient was discussed with Margy Clarks, PA-C. What exists of the patient's chart was reviewed in depth.  Clinical condition: stable.        Edwin Dada [facility] Pager [phone]

## 2016-06-06 NOTE — Progress Notes (Signed)
ANTICOAGULATION CONSULT NOTE - Initial Consult  Pharmacy Consult for Warfarin & Lovenox Indication: Mechanical Valve Replacement  Allergies  Allergen Reactions  . Adhesive [Tape] Other (See Comments)    Can burn skin if left on too long  . Celebrex [Celecoxib] Rash  . Detrol [Tolterodine] Hives    Patient Measurements:    Vital Signs: Temp: 98.3 F (36.8 C) (07/18 1940) BP: 119/67 mmHg (07/19 0148) Pulse Rate: 98 (07/19 0148)  Labs:  Recent Labs  06/04/16 0529 06/05/16 2042 06/06/16 0234 06/06/16 0243  HGB  --  10.8*  --   --   HCT  --  33.6*  --   --   PLT  --  300  --   --   LABPROT 28.1*  --   --  18.5*  INR 2.79*  --   --  1.54*  CREATININE  --  1.08*  --   --   TROPONINI  --   --  <0.03  --     Estimated Creatinine Clearance: 48.4 mL/min (by C-G formula based on Cr of 1.08).   Medical History: Past Medical History  Diagnosis Date  . CAD (coronary artery disease)   . Obesity   . Hypercholesteremia   . HTN (hypertension)   . Dyslipidemia   . Aortic stenosis   . Thrombophlebitis   . Heart murmur   . Myocardial infarction ([facility]) 12/2014  . Pneumonia 11/2014; 12/2014  . GERD (gastroesophageal reflux disease)   . Stroke syndrome [facility] 1995; 2001    "when my son was born; problems w/speech and L hand since then" (01/19/2015)  . Anxiety   . Depression   . Allergy   . CHF (congestive heart failure) ([facility])   . Rheumatoid arthritis(714.0)     "hands" (07/01/2015)  . Diabetes ([facility]) dx'd 1982    "went straight to insulin"    Medications:  Scheduled:  . citalopram  40 mg Oral Daily  . [START ON 06/07/2016] cosyntropin  0.25 mg Intravenous Once  . enoxaparin (LOVENOX) injection  50 mg Subcutaneous NOW  . enoxaparin (LOVENOX) injection  50 mg Subcutaneous Q12H  . insulin regular  0-10 Units Intravenous TID WC  . warfarin  5 mg Oral Once  . Warfarin - Pharmacist Dosing Inpatient   Does not apply q1800   Infusions:  . sodium chloride    . dextrose 5 % and  0.45% NaCl 75 mL/hr at 06/06/16 0217  . insulin (NOVOLIN-R) infusion      Assessment:  55 yr female known to pharmacy for anticoagulation management from recent hospitalization (discharged 7/17)  Patient being readmitted for hyperglycemia.    Warfarin during recent hospitalization:  7/14 PTA Warfarin 3mg  daily = INR = 1.31 (received Warf 10mg  x 1)  7/15 INR = 1.29 (received Warf 10mg  x 1)  7/16 INR = 2.13 (received Warf 5mg  x 1)  7/17 INR = 2.79 (patient discharged and received Warf 3mg  x 1 per [facility] MAR with instructions to take 5mg  on 7/18 & 7/19 with INR check on 7/20)  INR upon arrival in ED on 7/19 = 1.54  No warfarin taken on 7/18  Patient received Lovenox 40mg  sq x 1 per [facility] MAR on 7/18 @ 15:00  Due to subtherapeutic INR, spoke with Dr 8/18 and received verbal order to begin full-dose Lovenox per pharmacy dosing for bridge therapy until INR therapeutic  Goal of Therapy:  INR 2.5-3.5   Plan:   Lovenox 50mg  sq q12h until INR therapeutic  Warfarin  7.5mg  po x 1 dose @ 8am today  Follow daily INR  Claudell Wohler, Joselyn Glassman, PharmD 06/06/2016,3:42 AM

## 2016-06-06 NOTE — Progress Notes (Signed)
PROGRESS NOTE  Madison [name]  WCH:[id number] DOB: 07/28/1961 DOA: 06/05/2016 PCP: Dorothyann Peng, NP Outpatient Specialists:  Subjective: Denies any new complaints, reported eating whatever provided to her in the nursing home.  Brief Narrative:  Madison [name] is a 55 y.o. female with a past medical history significant for IDDM with recurrent DKA in the last 6 months, previous strokes with residual L sided weakness/dysarthria, and hx of AV repair on warfarin who presents with hyperglycemia.  The patient was just discharged from the hospital yesterday after being admitted for DKA again. On the day of discharge, her BG was 72, she was on a strict diabetic diet, and had been getting Levemir 20 units daily with sliding scale corrections. She felt fine at discharge and after and describes that she "ate nothing other than what they gave me" and got all her insulin shots at the SNF yesterday. However, today her BG was >500, so they called the doctor at the SNF who recommended she be sent to the ER.  Assessment & Plan:   Principal Problem:   Hyperglycemia Active Problems:   Essential hypertension   Normocytic anemia   Chronic diastolic heart failure (HCC)   Hyponatremia: Pseudo   Brittle diabetes mellitus (HCC)   Malnutrition of moderate degree   Hyperglycemia in brittle IDDM:  -Admitted now 14th time since January for hyperglycemia or DKA, several times requiring ICU admission.  -Prior to discharge she was on 20 units, but she was discharged on 8 units of insulin. -Initially started on insulin drip, no acidosis or ketosis, switched to Levemir insulin this morning. -Levemir insulin dose change to 8 units twice a day, NovoLog 5 units with meals.  Leukocytosis,? SIRS -With respect to occult infection, she has had leukocytosis during previous hospitalizations, attributed to DKA, and no obvious source and is asymptomatic currently. CXR is clear. Previous urine and blood cultures are without  growth over last two months. No abdominal pain, pelvic pain or flank pain, no joint pain. No pain in left ankle at site of previous ORIF and orthopedic hardware. -Check TSH and free T4 again -Check cosyntropin stim protocol -Check ESR and CRP and blood cultures for occult infection   Hyponatremia:  Corrects to near normal/baseline for patient with CHF.  Elevated creatinine:  Baseline Cr 0.8, now slightly elevated in setting of hyperglycemia. -Repeat BMP tomorrow  Chronic diastolic CHF and HTN:  -Continue furosemide daily  Chronic normocytic anemia:  Stable  History of AVR and stroke: -Check INR now and continue warfarin dosed per pharmacy  Chronic malnutrition, moderate degree: -Continue glucerna  Hx of depression: -Continue Celexa  Imaging finding of distal esophageal thickening -CT scan from 03/24/2016 showed distal esophageal thickening -GERD versus distal esophageal mass, continue Protonix needs endoscopic evaluation as outpatient.   DVT prophylaxis:  Code Status: Full Code Family Communication:  Disposition Plan:  Diet: Diet Carb Modified Fluid consistency:: Thin; Room service appropriate?: Yes  Consultants:   None  Procedures:   None  Antimicrobials:   None  Objective: Filed Vitals:   06/06/16 0030 06/06/16 0148 06/06/16 0300 06/06/16 0320  BP: 134/72 119/67  108/55  Pulse: 103 98  88  Temp:    98.5 F (36.9 C)  TempSrc:    Oral  Resp:  17  17  Height:   _0  (1.6 m)   Weight:   49.1 kg (108 lb 3.9 oz)   SpO2: 100% 100%  100%    Intake/Output Summary (Last 24 hours) at 06/06/16 1009  Last data filed at 06/06/16 0958  Gross per 24 hour  Intake    120 ml  Output      0 ml  Net    120 ml   Filed Weights   06/06/16 0300  Weight: 49.1 kg (108 lb 3.9 oz)    Examination: General exam: Appears calm and comfortable  Respiratory system: Clear to auscultation. Respiratory effort normal. Cardiovascular system: S1 & S2 heard, RRR. No  JVD, murmurs, rubs, gallops or clicks. No pedal edema. Gastrointestinal system: Abdomen is nondistended, soft and nontender. No organomegaly or masses felt. Normal bowel sounds heard. Central nervous system: Alert and oriented. No focal neurological deficits. Extremities: Symmetric 5 x 5 power. Skin: No rashes, lesions or ulcers Psychiatry: Judgement and insight appear normal. Mood & affect appropriate.   Data Reviewed: I have personally reviewed following labs and imaging studies  CBC:  Recent Labs Lab 06/01/16 1138 06/02/16 0305 06/05/16 2042  WBC 14.4* 15.2* 7.1  NEUTROABS 12.7*  --  4.2  HGB 10.3* 9.1* 10.8*  HCT 33.8* 27.1* 33.6*  MCV 93.6 86.3 88.2  PLT 380 313 478   Basic Metabolic Panel:  Recent Labs Lab 06/02/16 0926 06/02/16 1224 06/02/16 1622 06/05/16 2042 06/06/16 0655  NA 134* 133* 134* 128* 132*  K 4.1 4.2 4.4 4.8 4.5  CL 106 103 105 95* 98*  CO2 22 22 20* 20* 22  GLUCOSE 82 99 102* 402* 192*  BUN _0 CREATININE 0.74 0.66 0.84 1.08* 0.71  CALCIUM 8.4* 8.7* 8.5* 9.3 8.9   GFR: Estimated Creatinine Clearance: 61.6 mL/min (by C-G formula based on Cr of 0.71). Liver Function Tests:  Recent Labs Lab 06/01/16 1138 06/02/16 0305 06/05/16 2042  AST 22 20 35  ALT 15 12* 22  ALKPHOS 108 84 128*  BILITOT 1.8* 0.9 0.6  PROT 6.8 5.4* 6.8  ALBUMIN 3.6 2.8* 3.5   No results for input(s): LIPASE, AMYLASE in the last 168 hours. No results for input(s): AMMONIA in the last 168 hours. Coagulation Profile:  Recent Labs Lab 06/01/16 1138 06/02/16 0305 06/03/16 0313 06/04/16 0529 06/06/16 0243  INR 1.31 1.29 2.13* 2.79* 1.54*   Cardiac Enzymes:  Recent Labs Lab 06/06/16 0234  TROPONINI <0.03   BNP (last 3 results) No results for input(s): PROBNP in the last 8760 hours. HbA1C: No results for input(s): HGBA1C in the last 72 hours. CBG:  Recent Labs Lab 06/06/16 0505 06/06/16 0611 06/06/16 0715 06/06/16 0813 06/06/16 0912  GLUCAP  189* 203* 162* 134* 99   Lipid Profile: No results for input(s): CHOL, HDL, LDLCALC, TRIG, CHOLHDL, LDLDIRECT in the last 72 hours. Thyroid Function Tests:  Recent Labs  06/06/16 0243  TSH 1.484  FREET4 0.85   Anemia Panel: No results for input(s): VITAMINB12, FOLATE, FERRITIN, TIBC, IRON, RETICCTPCT in the last 72 hours. Urine analysis:    Component Value Date/Time   COLORURINE YELLOW 06/05/2016 2050   APPEARANCEUR CLEAR 06/05/2016 2050   LABSPEC 1.017 06/05/2016 2050   PHURINE 5.5 06/05/2016 2050   GLUCOSEU >1000* 06/05/2016 2050   HGBUR NEGATIVE 06/05/2016 2050   BILIRUBINUR NEGATIVE 06/05/2016 2050   BILIRUBINUR n 06/16/2015 1244   KETONESUR 15* 06/05/2016 2050   PROTEINUR NEGATIVE 06/05/2016 2050   PROTEINUR n 06/16/2015 1244   UROBILINOGEN 0.2 07/30/2015 1415   UROBILINOGEN 0.2 06/16/2015 1244   NITRITE NEGATIVE 06/05/2016 2050   NITRITE n 06/16/2015 1244   LEUKOCYTESUR NEGATIVE 06/05/2016 2050   Sepsis Labs: _1 (procalcitonin:4,lacticidven:4)  )  Recent Results (from the past 240 hour(s))  Culture, blood (routine x 2)     Status: None (Preliminary result)   Collection Time: 06/02/16  9:26 AM  Result Value Ref Range Status   Specimen Description BLOOD LEFT [facility]  Final   Special Requests IN PEDIATRIC BOTTLE 2CC  Final   Culture   Final    NO GROWTH 3 DAYS Performed at [facility]    Report Status PENDING  Incomplete  Culture, blood (routine x 2)     Status: None (Preliminary result)   Collection Time: 06/02/16 12:24 PM  Result Value Ref Range Status   Specimen Description BLOOD LEFT [facility]  Final   Special Requests IN PEDIATRIC BOTTLE [place]  Final   Culture   Final    NO GROWTH 3 DAYS Performed at [facility]    Report Status PENDING  Incomplete     Invalid input(s): PROCALCITONIN, LACTICACIDVEN   Radiology Studies: No results found.      Scheduled Meds: . citalopram  40 mg Oral Daily  . enoxaparin (LOVENOX) injection  50 mg  Subcutaneous Q12H  . feeding supplement (GLUCERNA SHAKE)  237 mL Oral TID BM  . furosemide  20 mg Oral Daily  . insulin detemir  8 Units Subcutaneous Daily  . Warfarin - Pharmacist Dosing Inpatient   Does not apply q1800   Continuous Infusions:       Time spent: 35 minutes    Marijose Curington A, MD [facility] Pager [phone]  If 7PM-7AM, please contact night-coverage [email or web address] Password [email or web address] 06/06/2016, 10:09 AM

## 2016-06-06 NOTE — Discharge Summary (Signed)
This is an addendum to the discharge summary dictated by me on 05/24/2016 at 10:17 AM. I was asked by CD[facility] RN to document the severity of the CHF.  Chronic diastolic CHF -Echo on 05/02/2016 showed LVEF of 60-65% with grade 1 diastolic dysfunction, this is improved from 40-45% in grade 2 diastolic dysfunction. -This is stable admission, patient looked dry actually she was hypotensive and she was thought to be in hypovolemic shock. -Patient aggressively hydrated with IV fluids. -Initial CXR on 6/29 showed no evidence of fluids, early aspiration findings. -Follow up CXR on 7/1 showed mild edema but patient was still without clinical symptoms of CHF. -On discharge patient is started on 20 mg of Lasix.  Clint Lipps Pager: [phone] 06/06/2016, 3:44 PM

## 2016-06-06 NOTE — Progress Notes (Signed)
Noted MD to clarify administration of Cortrosyn due to lab draws did not coincide. MD with clarification of cortrosyn med administration and time of subsequent lab draws. MD stated to discontinue all IVF's except the insulin drip. Insulin drip rate after 06:08 CBG check was increased to 2.9. Next , CBG draw due at 07:13am. Pharmacy Conley Rolls ann and Lab staff called and updated to med administration and lab draw procedure. Patient  Was oriented to room environment and verbalized understanding.   Cortrosyn was given at 06:55am, Phlebotomy called [phone], spoke to Chip Boer to draw next lab 30 mins later at 07:25am Day shift nurse [place] aware.

## 2016-06-07 ENCOUNTER — Other Ambulatory Visit: Payer: Self-pay | Admitting: *Deleted

## 2016-06-07 DIAGNOSIS — I10 Essential (primary) hypertension: Secondary | ICD-10-CM | POA: Diagnosis not present

## 2016-06-07 DIAGNOSIS — E109 Type 1 diabetes mellitus without complications: Secondary | ICD-10-CM | POA: Diagnosis not present

## 2016-06-07 DIAGNOSIS — I5032 Chronic diastolic (congestive) heart failure: Secondary | ICD-10-CM | POA: Diagnosis not present

## 2016-06-07 DIAGNOSIS — R739 Hyperglycemia, unspecified: Secondary | ICD-10-CM | POA: Diagnosis not present

## 2016-06-07 LAB — CULTURE, BLOOD (ROUTINE X 2)
CULTURE: NO GROWTH
Culture: NO GROWTH

## 2016-06-07 LAB — GLUCOSE, CAPILLARY
GLUCOSE-CAPILLARY: 109 mg/dL — AB (ref 65–99)
GLUCOSE-CAPILLARY: 55 mg/dL — AB (ref 65–99)
Glucose-Capillary: 282 mg/dL — ABNORMAL HIGH (ref 65–99)
Glucose-Capillary: 182 mg/dL — ABNORMAL HIGH (ref 65–99)
Glucose-Capillary: 241 mg/dL — ABNORMAL HIGH (ref 65–99)

## 2016-06-07 LAB — PROTIME-INR
INR: 1.74 — ABNORMAL HIGH (ref 0.00–1.49)
PROTHROMBIN TIME: 20.4 s — AB (ref 11.6–15.2)

## 2016-06-07 MED ORDER — INSULIN DETEMIR 100 UNIT/ML ~~LOC~~ SOLN
10.0000 [IU] | Freq: Two times a day (BID) | SUBCUTANEOUS | Status: DC
  Administered 2016-06-07 – 2016-06-08 (×2): 10 [IU] via SUBCUTANEOUS
  Filled 2016-06-07 (×3): qty 0.1

## 2016-06-07 MED ORDER — INSULIN ASPART 100 UNIT/ML ~~LOC~~ SOLN
7.0000 [IU] | Freq: Three times a day (TID) | SUBCUTANEOUS | Status: DC
Start: 2016-06-07 — End: 2016-06-07
  Administered 2016-06-07: 7 [IU] via SUBCUTANEOUS

## 2016-06-07 MED ORDER — WARFARIN SODIUM 5 MG PO TABS
10.0000 mg | ORAL_TABLET | Freq: Once | ORAL | Status: AC
  Administered 2016-06-07: 10 mg via ORAL
  Filled 2016-06-07: qty 2

## 2016-06-07 MED ORDER — INSULIN DETEMIR 100 UNIT/ML ~~LOC~~ SOLN
10.0000 [IU] | Freq: Two times a day (BID) | SUBCUTANEOUS | Status: DC
  Filled 2016-06-07: qty 0.1

## 2016-06-07 MED ORDER — INSULIN ASPART 100 UNIT/ML ~~LOC~~ SOLN
5.0000 [IU] | Freq: Three times a day (TID) | SUBCUTANEOUS | Status: DC
  Administered 2016-06-08: 5 [IU] via SUBCUTANEOUS

## 2016-06-07 MED ORDER — INSULIN DETEMIR 100 UNIT/ML ~~LOC~~ SOLN
2.0000 [IU] | Freq: Once | SUBCUTANEOUS | Status: AC
  Administered 2016-06-07: 2 [IU] via SUBCUTANEOUS
  Filled 2016-06-07: qty 0.02

## 2016-06-07 NOTE — Progress Notes (Signed)
PROGRESS NOTE  Madison [name]  JKK:[id number] DOB: [birth year] DOA: 06/05/2016 PCP: [name], NP Outpatient Specialists:  Subjective: Denies any new complaints, reported she is feeling much better.  Brief Narrative:  Madison [name] is a 55 y.o. female with a past medical history significant for IDDM with recurrent DKA in the last 6 months, previous strokes with residual L sided weakness/dysarthria, and hx of AV repair on warfarin who presents with hyperglycemia.  The patient was just discharged from the hospital yesterday after being admitted for DKA again. On the day of discharge, her BG was 72, she was on a strict diabetic diet, and had been getting Levemir 20 units daily with sliding scale corrections. She felt fine at discharge and after and describes that she "ate nothing other than what they gave me" and got all her insulin shots at the SNF yesterday. However, today her BG was >500, so they called the doctor at the SNF who recommended she be sent to the ER.  Assessment & Plan:   Principal Problem:   Hyperglycemia Active Problems:   Essential hypertension   Normocytic anemia   Chronic diastolic heart failure (HCC)   Hyponatremia: Pseudo   Brittle diabetes mellitus (HCC)   Malnutrition of moderate degree   Hyperglycemia in brittle IDDM:  -Admitted now 14th time since January for hyperglycemia or DKA, several times requiring ICU admission.  -Prior to discharge she was on 20 units, but she was discharged on 8 units of insulin. -Initially started on insulin drip, no acidosis or ketosis, switched to Levemir insulin this morning. -Fasting blood sugar is 282 on 8 units of Levemir, increased to 10 units and increase NovoLog to 7 units with meals. -Continue to adjust long and short-acting insulin.  -Please review last 2 discharges patient -Patient was discharged on 7/6 readmitted back on 7/7. DC 7/9, readmitted 7/14 and DC 7/17 readmitted 7/18. -I think it is reasonable to keep  her one more day to further adjust insulin regimen. -Received a call from case management that insurance might not pay for her her hospital stay for one more night. -I have asked her to check on that, I do not want to make more financial burden on the patient. -If insurance company still will not pay, I will discharge patient later today back to the nursing home.  Leukocytosis,? SIRS -With respect to occult infection, she has had leukocytosis during previous hospitalizations, attributed to DKA, and no obvious source and is asymptomatic currently. CXR is clear. Previous urine and blood cultures are without growth over last two months. No abdominal pain, pelvic pain or flank pain, no joint pain. No pain in left ankle at site of previous ORIF and orthopedic hardware. -TSH, T4 normal, ACTH stimulation appropriate.   Hyponatremia:  -Corrects to near normal/baseline for patient with CHF.  Acute kidney injury:  -Presented with creatinine of 1.08, creatinine down to 0.7 with hydration.  Chronic diastolic CHF and HTN:  -Furosemide held on admission, restarted back.  Chronic normocytic anemia:  Stable  History of AVR and stroke: -Check INR now and continue warfarin dosed per pharmacy  Chronic malnutrition, moderate degree: -Continue glucerna  Hx of depression: -Continue Celexa  Imaging finding of distal esophageal thickening -CT scan from 03/24/2016 showed distal esophageal thickening -GERD versus distal esophageal mass, continue Protonix needs endoscopic evaluation as outpatient.   DVT prophylaxis:  Code Status: Full Code Family Communication:  Disposition Plan:  Diet: Diet Carb Modified Fluid consistency:: Thin; Room service appropriate?: Yes  Consultants:  None  Procedures:   None  Antimicrobials:   None  Objective: Filed Vitals:   06/06/16 0320 06/06/16 1352 06/06/16 2220 06/07/16 0557  BP: 108/55 114/60 147/71 128/58  Pulse: 88 95 90 78  Temp: 98.5 F  (36.9 C) 98.6 F (37 C) 98.2 F (36.8 C) 97.9 F (36.6 C)  TempSrc: Oral Oral Oral Oral  Resp: 17 16 17 17   Height:      Weight:    51.256 kg (113 lb)  SpO2: 100% 100% 100% 100%    Intake/Output Summary (Last 24 hours) at 06/07/16 1221 Last data filed at 06/07/16 1122  Gross per 24 hour  Intake    480 ml  Output   1400 ml  Net   -920 ml   Filed Weights   06/06/16 0300 06/07/16 0557  Weight: 49.1 kg (108 lb 3.9 oz) 51.256 kg (113 lb)    Examination: General exam: Appears calm and comfortable  Respiratory system: Clear to auscultation. Respiratory effort normal. Cardiovascular system: S1 & S2 heard, RRR. No JVD, murmurs, rubs, gallops or clicks. No pedal edema. Gastrointestinal system: Abdomen is nondistended, soft and nontender. No organomegaly or masses felt. Normal bowel sounds heard. Central nervous system: Alert and oriented. No focal neurological deficits. Extremities: Symmetric 5 x 5 power. Skin: No rashes, lesions or ulcers Psychiatry: Judgement and insight appear normal. Mood & affect appropriate.   Data Reviewed: I have personally reviewed following labs and imaging studies  CBC:  Recent Labs Lab 06/01/16 1138 06/02/16 0305 06/05/16 2042  WBC 14.4* 15.2* 7.1  NEUTROABS 12.7*  --  4.2  HGB 10.3* 9.1* 10.8*  HCT 33.8* 27.1* 33.6*  MCV 93.6 86.3 88.2  PLT 380 313 300   Basic Metabolic Panel:  Recent Labs Lab 06/02/16 0926 06/02/16 1224 06/02/16 1622 06/05/16 2042 06/06/16 0655  NA 134* 133* 134* 128* 132*  K 4.1 4.2 4.4 4.8 4.5  CL 106 103 105 95* 98*  CO2 22 22 20* 20* 22  GLUCOSE 82 99 102* 402* 192*  BUN 9 8 7 16 11   CREATININE 0.74 0.66 0.84 1.08* 0.71  CALCIUM 8.4* 8.7* 8.5* 9.3 8.9   GFR: Estimated Creatinine Clearance: 64.3 mL/min (by C-G formula based on Cr of 0.71). Liver Function Tests:  Recent Labs Lab 06/01/16 1138 06/02/16 0305 06/05/16 2042  AST 22 20 35  ALT 15 12* 22  ALKPHOS 108 84 128*  BILITOT 1.8* 0.9 0.6  PROT  6.8 5.4* 6.8  ALBUMIN 3.6 2.8* 3.5   No results for input(s): LIPASE, AMYLASE in the last 168 hours. No results for input(s): AMMONIA in the last 168 hours. Coagulation Profile:  Recent Labs Lab 06/02/16 0305 06/03/16 0313 06/04/16 0529 06/06/16 0243 06/07/16 0548  INR 1.29 2.13* 2.79* 1.54* 1.74*   Cardiac Enzymes:  Recent Labs Lab 06/06/16 0234  TROPONINI <0.03   BNP (last 3 results) No results for input(s): PROBNP in the last 8760 hours. HbA1C: No results for input(s): HGBA1C in the last 72 hours. CBG:  Recent Labs Lab 06/06/16 1157 06/06/16 1650 06/06/16 2217 06/07/16 0740 06/07/16 1159  GLUCAP 284* 177* 303* 282* 109*   Lipid Profile: No results for input(s): CHOL, HDL, LDLCALC, TRIG, CHOLHDL, LDLDIRECT in the last 72 hours. Thyroid Function Tests:  Recent Labs  06/06/16 0243  TSH 1.484  FREET4 0.85   Anemia Panel: No results for input(s): VITAMINB12, FOLATE, FERRITIN, TIBC, IRON, RETICCTPCT in the last 72 hours. Urine analysis:    Component Value Date/Time  COLORURINE YELLOW 06/05/2016 2050   APPEARANCEUR CLEAR 06/05/2016 2050   LABSPEC 1.017 06/05/2016 2050   PHURINE 5.5 06/05/2016 2050   GLUCOSEU >1000* 06/05/2016 2050   HGBUR NEGATIVE 06/05/2016 2050   BILIRUBINUR NEGATIVE 06/05/2016 2050   BILIRUBINUR n 06/16/2015 1244   KETONESUR 15* 06/05/2016 2050   PROTEINUR NEGATIVE 06/05/2016 2050   PROTEINUR n 06/16/2015 1244   UROBILINOGEN 0.2 07/30/2015 1415   UROBILINOGEN 0.2 06/16/2015 1244   NITRITE NEGATIVE 06/05/2016 2050   NITRITE n 06/16/2015 1244   LEUKOCYTESUR NEGATIVE 06/05/2016 2050   Sepsis Labs: @LABRCNTIP (procalcitonin:4,lacticidven:4)  ) Recent Results (from the past 240 hour(s))  Culture, blood (routine x 2)     Status: None (Preliminary result)   Collection Time: 06/02/16  9:26 AM  Result Value Ref Range Status   Specimen Description BLOOD LEFT [facility]  Final   Special Requests IN PEDIATRIC BOTTLE 2CC  Final   Culture    Final    NO GROWTH 4 DAYS Performed at [facility]    Report Status PENDING  Incomplete  Culture, blood (routine x 2)     Status: None (Preliminary result)   Collection Time: 06/02/16 12:24 PM  Result Value Ref Range Status   Specimen Description BLOOD LEFT [facility]  Final   Special Requests IN PEDIATRIC BOTTLE 2CC  Final   Culture   Final    NO GROWTH 4 DAYS Performed at [facility]    Report Status PENDING  Incomplete     Invalid input(s): PROCALCITONIN, LACTICACIDVEN   Radiology Studies: No results found.      Scheduled Meds: . citalopram  40 mg Oral Daily  . enoxaparin (LOVENOX) injection  50 mg Subcutaneous Q12H  . feeding supplement (GLUCERNA SHAKE)  237 mL Oral TID BM  . furosemide  20 mg Oral Daily  . insulin aspart  0-9 Units Subcutaneous TID WC  . insulin aspart  7 Units Subcutaneous TID WC  . insulin detemir  10 Units Subcutaneous BID  . warfarin  10 mg Oral ONCE-1800  . Warfarin - Pharmacist Dosing Inpatient   Does not apply q1800   Continuous Infusions:       Time spent: 35 minutes    Madison [name] A, MD [facility] Pager [phone]  If 7PM-7AM, please contact night-coverage [email or web address] Password [email or web address] 06/07/2016, 12:21 PM

## 2016-06-07 NOTE — Progress Notes (Signed)
Hypoglycemic Event  CBG: 55  Treatment: 15 GM carbohydrate snack  Symptoms: None  Follow-up CBG: [id number] CBG Result:182  Possible Reasons for Event: medication regimen  Comments/MD notified: Dr Mirian Capuchin, Francesca Oman

## 2016-06-07 NOTE — Patient Outreach (Signed)
[facility]) Care Management  06/07/2016  Madison [name] [birth year] [id number]   CSW attempted to contact patient today to follow-up regarding social work services and resources; however, patient was unavailable.  CSW then noted that patient had been readmitted into [facility], as of 06/05/2016, due to Hyperglycemia.  Patient was residing at [facility] where she was receiving short-term rehabilitative services.  The plan is for patient to return to [name] once she is medically stable and ready for discharge.  CSW will converse with patient, as well as Madison [name], Licensed Clinical Social Worker/Admissions Coordinator at the facility, about arranging long-term care placement for patient. Madison [name], BSW, MSW, LCSW  Licensed Restaurant manager, [employer] [facility]  Mailing [place] [address], [place], [place] [address] Physical Address-[address], [place], [place] [address] Toll Free Main # [phone] Fax # [phone] Cell # [phone]  Fax # [phone]  [name]

## 2016-06-07 NOTE — Consult Note (Signed)
   [facility] CM Inpatient Consult   06/07/2016  Madison [name] Jan 25, 1961 [id number]    Ms. Schirmer has been followed by [facility] Care Management services. Spoke with inpatient RNCM and inpatient Licensed to make aware that [facility] Care Management is following. Discharge plan is for patient to return to [facility]. Will touch base with [facility] Community team.   Raiford Noble, MSN-Ed, RN,BSN [facility] Liaison [phone]

## 2016-06-07 NOTE — Progress Notes (Signed)
LCSWA met with patient at bedside. Pt. reports she is feeling better. Pt. will return to [name]. She does report   any concerns at this time.   Kathrin Greathouse, Latanya Presser, MSW Clinical Social Worker 5E and Psychiatric Service Line [phone] 06/07/2016  11:12 AM

## 2016-06-07 NOTE — NC FL2 (Signed)
[place] MEDICAID FL2 LEVEL OF CARE SCREENING TOOL     IDENTIFICATION  Patient Name: Madison [name] Birthdate: 1961/06/20 Sex: female Admission Date (Current Location): 06/05/2016  [facility] and [place] Number:      Facility and Address:  [facility],  [address] [place] [address]      Provider Number: [id number]  Attending Physician Name and Address:  Clydia Llano, MD  Relative Name and Phone Number:       Current Level of Care: Hospital Recommended Level of Care: Nursing Facility Prior Approval Number:    Date Approved/Denied:   PASRR Number: [id number]  Discharge Plan: SNF    Current Diagnoses: Patient Active Problem List   Diagnosis Date Noted  . Hyperglycemia 06/06/2016  . Malnutrition of moderate degree 06/04/2016  . Septic shock (HCC) 06/04/2016  . SIRS (systemic inflammatory response syndrome) (HCC) 05/25/2016  . Type 1 diabetes mellitus with ketoacidosis without coma (HCC)   . MDD (major depressive disorder), recurrent episode, moderate (HCC)   . Aspiration pneumonia (HCC) 05/17/2016  . Diabetic ketoacidosis without coma (HCC)   . Sepsis (HCC)   . 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 MSOF (multiple systems organ failure) 05/01/2016  . Acute hypoxemic respiratory failure (HCC)   . Type I diabetes mellitus with complication, uncontrolled (HCC) 03/29/2016  . Adjustment disorder with mixed anxiety and depressed mood 03/28/2016  . HLD (hyperlipidemia) 03/27/2016  . AKI (acute kidney injury) (HCC) 03/27/2016  . Chronic combined systolic and diastolic congestive heart failure (HCC)   . Diabetic ketoacidosis (HCC) 03/26/2016  . Type 1 diabetes, HbA1c goal < 7% (HCC)   . Hypotension 03/08/2016  . Uncontrolled type 1 diabetes mellitus with ketoacidotic coma (HCC)   . Acute encephalopathy 02/28/2016  . Hyponatremia: Pseudo 02/28/2016  . Lactic acidosis   . Diabetic ketoacidosis with coma associated with type 1 diabetes mellitus (HCC)    . Hypoglycemia   . Chronic diastolic heart failure (HCC)   . Esophageal reflux   . S/P AVR (aortic valve replacement)   . ARF (acute renal failure) (HCC) 01/22/2016  . CHF (congestive heart failure) (HCC) 01/22/2016  . Diabetic ketoacidosis without coma associated with type 1 diabetes mellitus (HCC)   . DKA, type 1 (HCC) 11/23/2015  . Constipation 11/23/2015  . Leukocytosis   . Pressure ulcer 08/02/2015  . Diabetic ketoacidosis with coma associated with other specified diabetes mellitus (HCC)   . Closed left ankle fracture 07/01/2015  . Trimalleolar fracture of left ankle 07/01/2015  . Hyperkalemia   . Insomnia 06/16/2015  . Normocytic anemia 01/31/2015  . Acute on chronic combined systolic and diastolic CHF (congestive heart failure) (HCC)   . Essential hypertension   . Elevated troponin   . Diabetes type 1, uncontrolled (HCC)   . Demand ischemia (HCC)   . S/P aortic valve replacement   . Hypokalemia   . NSTEMI (non-ST elevated myocardial infarction) (HCC) 12/08/2014  . Blood poisoning (HCC)   . Supratherapeutic INR   . Severe sepsis with acute organ dysfunction (HCC) 12/03/2014  . Spastic hemiplegia affecting nondominant side (HCC) 11/29/2014  . Dysphagia, pharyngoesophageal phase 10/04/2014  . Sinus tachycardia (HCC) 09/30/2014  . Depression 04/29/2014  . CVA (cerebral infarction) 03/12/2014  . Acute respiratory failure (HCC) 03/12/2014  . Chronic anticoagulation 07/28/2013  . Long term (current) use of anticoagulants 07/28/2013  . CAD (coronary artery disease)   . Aortic stenosis   . Hyperlipidemia   . Rheumatoid arthritis (HCC)  Orientation RESPIRATION BLADDER Height & Weight     Self, Time, Situation, Place  Normal Continent Weight: 113 lb (51.256 kg) Height:  5\' 3"  (160 cm)  BEHAVIORAL SYMPTOMS/MOOD NEUROLOGICAL BOWEL NUTRITION STATUS      Continent Diet (Diet Carb Modified Fluid consistency:: Thin;  )  AMBULATORY STATUS COMMUNICATION OF NEEDS Skin      Verbally Normal                       Personal Care Assistance Level of Assistance  Bathing, Dressing Bathing Assistance: Limited assistance Feeding assistance: Independent       Functional Limitations Info  Sight, Hearing, Speech Sight Info: Adequate Hearing Info: Adequate Speech Info: Adequate    SPECIAL CARE FACTORS FREQUENCY                       Contractures Contractures Info: Not present    Additional Factors Info  Code Status, Allergies, Psychotropic, Insulin Sliding Scale, Isolation Precautions Code Status Info: Full Allergies Info: Adhesive, Celebrex, Detrol Psychotropic Info: Celexa 40mg  Insulin Sliding Scale Info: insulin aspart (novoLOG) injection 0-9 Units, insulin aspart (novoLOG) injection 7 Units, insulin detemir (LEVEMIR) injection 10 Units Isolation Precautions Info: MRSA     Current Medications (06/07/2016):  This is the current hospital active medication list Current Facility-Administered Medications  Medication Dose Route Frequency Provider Last Rate Last Dose  . acetaminophen (TYLENOL) tablet 650 mg  650 mg Oral Q6H PRN , MD       Or  . acetaminophen (TYLENOL) suppository 650 mg  650 mg Rectal Q6H PRN 06/09/2016, MD      . citalopram (CELEXA) tablet 40 mg  40 mg Oral Daily Alberteen Sam, MD   40 mg at 06/07/16 0957  . dextrose 50 % solution 25 mL  25 mL Intravenous PRN Alberteen Sam, MD      . enoxaparin (LOVENOX) injection 50 mg  50 mg Subcutaneous Q12H Leann T Poindexter, RPH   50 mg at 06/07/16 0624  . feeding supplement (GLUCERNA SHAKE) (GLUCERNA SHAKE) liquid 237 mL  237 mL Oral TID BM Alberteen Sam, MD   237 mL at 06/07/16 1354  . furosemide (LASIX) tablet 20 mg  20 mg Oral Daily Alberteen Sam, MD   20 mg at 06/07/16 0957  . insulin aspart (novoLOG) injection 0-9 Units  0-9 Units Subcutaneous TID WC Alberteen Sam, MD   5 Units at 06/07/16 0904  . insulin aspart (novoLOG)  injection 7 Units  7 Units Subcutaneous TID WC Clydia Llano, MD   7 Units at 06/07/16 1223  . insulin detemir (LEVEMIR) injection 10 Units  10 Units Subcutaneous BID Clydia Llano, MD      . warfarin (COUMADIN) tablet 10 mg  10 mg Oral ONCE-1800 06/09/16, RPH      . Warfarin - Pharmacist Dosing Inpatient   Does not apply q1800 Clydia Llano, RPH   0  at 06/06/16 1800     Discharge Medications: Please see discharge summary for a list of discharge medications.  Relevant Imaging Results:  Relevant Lab Results:   Additional Information SS#[id number]  06/08/16, LCSW

## 2016-06-07 NOTE — Care Management Obs Status (Signed)
MEDICARE OBSERVATION STATUS NOTIFICATION   Patient Details  Name: Madison [name] MRN: [id number] Date of Birth: [birth year]   Medicare Observation Status Notification Given:  Yes    Alexis Goodell, RN 06/07/2016, 3:09 PM

## 2016-06-07 NOTE — Progress Notes (Addendum)
ANTICOAGULATION CONSULT NOTE   Pharmacy Consult for Warfarin & Lovenox Indication: Mechanical Valve Replacement  Allergies  Allergen Reactions  . Adhesive [Tape] Other (See Comments)    Can burn skin if left on too long  . Celebrex [Celecoxib] Rash  . Detrol [Tolterodine] Hives    Patient Measurements: Height: 5\' 3"  (160 cm) Weight: 113 lb (51.256 kg) IBW/kg (Calculated) : 52.4  Vital Signs: Temp: 97.9 F (36.6 C) (07/20 0557) Temp Source: Oral (07/20 0557) BP: 128/58 mmHg (07/20 0557) Pulse Rate: 78 (07/20 0557)  Labs:  Recent Labs  06/05/16 2042 06/06/16 0234 06/06/16 0243 06/06/16 0655 06/07/16 0548  HGB 10.8*  --   --   --   --   HCT 33.6*  --   --   --   --   PLT 300  --   --   --   --   LABPROT  --   --  18.5*  --  20.4*  INR  --   --  1.54*  --  1.74*  CREATININE 1.08*  --   --  0.71  --   TROPONINI  --  <0.03  --   --   --     Estimated Creatinine Clearance: 64.3 mL/min (by C-G formula based on Cr of 0.71).   Medical History: Past Medical History  Diagnosis Date  . CAD (coronary artery disease)   . Obesity   . Hypercholesteremia   . HTN (hypertension)   . Dyslipidemia   . Aortic stenosis   . Thrombophlebitis   . Heart murmur   . Myocardial infarction (HCC) 12/2014  . Pneumonia 11/2014; 12/2014  . GERD (gastroesophageal reflux disease)   . Stroke syndrome [facility]) 1995; 2001    "when my son was born; problems w/speech and L hand since then" (01/19/2015)  . Anxiety   . Depression   . Allergy   . CHF (congestive heart failure) (HCC)   . Rheumatoid arthritis(714.0)     "hands" (07/01/2015)  . Diabetes (HCC) dx'd 1982    "went straight to insulin"    Medications:  Scheduled:  . citalopram  40 mg Oral Daily  . enoxaparin (LOVENOX) injection  50 mg Subcutaneous Q12H  . feeding supplement (GLUCERNA SHAKE)  237 mL Oral TID BM  . furosemide  20 mg Oral Daily  . insulin aspart  0-9 Units Subcutaneous TID WC  . insulin aspart  5 Units Subcutaneous TID  WC  . insulin detemir  8 Units Subcutaneous BID  . Warfarin - Pharmacist Dosing Inpatient   Does not apply q1800   Infusions:     Assessment:  55 yr female known to pharmacy for anticoagulation management from recent hospitalization (discharged 7/17)  Patient being readmitted for hyperglycemia.    Warfarin during recent hospitalization:  7/14 PTA Warfarin 3mg  daily = INR = 1.31 (received Warf 10mg  x 1)  7/15 INR = 1.29 (received Warf 10mg  x 1)  7/16 INR = 2.13 (received Warf 5mg  x 1)  7/17 INR = 2.79 (patient discharged and received Warf 3mg  x 1 per [facility] MAR with instructions to take 5mg  on 7/18 & 7/19 with INR check on 7/20)  INR upon arrival in ED on 7/19 = 1.54  No warfarin taken on 7/18  Patient received Lovenox 40mg  sq x 1 per [facility] MAR on 7/18 @ 15:00 Due to subtherapeutic INR at admission, pharmacist spoke with Dr and received verbal order to begin full-dose Lovenox per pharmacy dosing for bridge therapy until  INR therapeutic  Today, 06/07/2016:  INR subtherapeutic at 1.74 following 7.5mg  dose 7/19am  No CBC this am  No major drug interactions  Goal of Therapy:  INR 2.5-3.5   Plan:   Lovenox 50mg  sq q12h until INR therapeutic (> 2.5)  Warfarin 10mg  po x 1 today  Note: appears patients warfarin needs have been much more than 3mg  daily (most recent dose reported by SNF).    If discharged today, suggest warfarin 7.5mg  PO daily until next INR follow up (Monday at the latest for INR f/u)  Follow daily INR  CBC at least q72h while on LMWH In hospital  , PharmD, BCPS.   Pager: 06/07/2016 8:[phone] AM

## 2016-06-08 ENCOUNTER — Ambulatory Visit: Payer: Self-pay | Admitting: Endocrinology

## 2016-06-08 ENCOUNTER — Other Ambulatory Visit: Payer: Self-pay | Admitting: Adult Health

## 2016-06-08 DIAGNOSIS — E875 Hyperkalemia: Secondary | ICD-10-CM | POA: Diagnosis not present

## 2016-06-08 DIAGNOSIS — E162 Hypoglycemia, unspecified: Secondary | ICD-10-CM | POA: Diagnosis not present

## 2016-06-08 DIAGNOSIS — R112 Nausea with vomiting, unspecified: Secondary | ICD-10-CM | POA: Diagnosis present

## 2016-06-08 DIAGNOSIS — E10649 Type 1 diabetes mellitus with hypoglycemia without coma: Secondary | ICD-10-CM | POA: Diagnosis not present

## 2016-06-08 DIAGNOSIS — Z0289 Encounter for other administrative examinations: Secondary | ICD-10-CM

## 2016-06-08 DIAGNOSIS — D72829 Elevated white blood cell count, unspecified: Secondary | ICD-10-CM | POA: Diagnosis not present

## 2016-06-08 DIAGNOSIS — Z7901 Long term (current) use of anticoagulants: Secondary | ICD-10-CM | POA: Diagnosis not present

## 2016-06-08 DIAGNOSIS — I5032 Chronic diastolic (congestive) heart failure: Secondary | ICD-10-CM | POA: Diagnosis not present

## 2016-06-08 DIAGNOSIS — R531 Weakness: Secondary | ICD-10-CM | POA: Diagnosis not present

## 2016-06-08 DIAGNOSIS — Z951 Presence of aortocoronary bypass graft: Secondary | ICD-10-CM | POA: Diagnosis not present

## 2016-06-08 DIAGNOSIS — K219 Gastro-esophageal reflux disease without esophagitis: Secondary | ICD-10-CM | POA: Diagnosis not present

## 2016-06-08 DIAGNOSIS — R739 Hyperglycemia, unspecified: Secondary | ICD-10-CM | POA: Diagnosis not present

## 2016-06-08 DIAGNOSIS — M25552 Pain in left hip: Secondary | ICD-10-CM | POA: Diagnosis not present

## 2016-06-08 DIAGNOSIS — Z6820 Body mass index (BMI) 20.0-20.9, adult: Secondary | ICD-10-CM | POA: Diagnosis not present

## 2016-06-08 DIAGNOSIS — I110 Hypertensive heart disease with heart failure: Secondary | ICD-10-CM | POA: Diagnosis not present

## 2016-06-08 DIAGNOSIS — E785 Hyperlipidemia, unspecified: Secondary | ICD-10-CM | POA: Diagnosis not present

## 2016-06-08 DIAGNOSIS — E1069 Type 1 diabetes mellitus with other specified complication: Secondary | ICD-10-CM | POA: Diagnosis not present

## 2016-06-08 DIAGNOSIS — N179 Acute kidney failure, unspecified: Secondary | ICD-10-CM | POA: Diagnosis not present

## 2016-06-08 DIAGNOSIS — E1165 Type 2 diabetes mellitus with hyperglycemia: Secondary | ICD-10-CM | POA: Diagnosis not present

## 2016-06-08 DIAGNOSIS — M6281 Muscle weakness (generalized): Secondary | ICD-10-CM | POA: Diagnosis not present

## 2016-06-08 DIAGNOSIS — Z8673 Personal history of transient ischemic attack (TIA), and cerebral infarction without residual deficits: Secondary | ICD-10-CM | POA: Diagnosis not present

## 2016-06-08 DIAGNOSIS — Z9109 Other allergy status, other than to drugs and biological substances: Secondary | ICD-10-CM | POA: Diagnosis not present

## 2016-06-08 DIAGNOSIS — E1010 Type 1 diabetes mellitus with ketoacidosis without coma: Secondary | ICD-10-CM | POA: Diagnosis not present

## 2016-06-08 DIAGNOSIS — M8080XD Other osteoporosis with current pathological fracture, unspecified site, subsequent encounter for fracture with routine healing: Secondary | ICD-10-CM | POA: Diagnosis not present

## 2016-06-08 DIAGNOSIS — H9202 Otalgia, left ear: Secondary | ICD-10-CM | POA: Diagnosis present

## 2016-06-08 DIAGNOSIS — I2510 Atherosclerotic heart disease of native coronary artery without angina pectoris: Secondary | ICD-10-CM | POA: Diagnosis not present

## 2016-06-08 DIAGNOSIS — Z952 Presence of prosthetic heart valve: Secondary | ICD-10-CM | POA: Diagnosis not present

## 2016-06-08 DIAGNOSIS — E440 Moderate protein-calorie malnutrition: Secondary | ICD-10-CM | POA: Diagnosis not present

## 2016-06-08 DIAGNOSIS — R2689 Other abnormalities of gait and mobility: Secondary | ICD-10-CM | POA: Diagnosis not present

## 2016-06-08 DIAGNOSIS — I5042 Chronic combined systolic (congestive) and diastolic (congestive) heart failure: Secondary | ICD-10-CM | POA: Diagnosis not present

## 2016-06-08 DIAGNOSIS — E43 Unspecified severe protein-calorie malnutrition: Secondary | ICD-10-CM | POA: Diagnosis not present

## 2016-06-08 DIAGNOSIS — I10 Essential (primary) hypertension: Secondary | ICD-10-CM | POA: Diagnosis not present

## 2016-06-08 DIAGNOSIS — E871 Hypo-osmolality and hyponatremia: Secondary | ICD-10-CM | POA: Diagnosis not present

## 2016-06-08 DIAGNOSIS — Z823 Family history of stroke: Secondary | ICD-10-CM | POA: Diagnosis not present

## 2016-06-08 DIAGNOSIS — R791 Abnormal coagulation profile: Secondary | ICD-10-CM | POA: Diagnosis not present

## 2016-06-08 DIAGNOSIS — E1365 Other specified diabetes mellitus with hyperglycemia: Secondary | ICD-10-CM | POA: Diagnosis not present

## 2016-06-08 DIAGNOSIS — W19XXXA Unspecified fall, initial encounter: Secondary | ICD-10-CM | POA: Diagnosis not present

## 2016-06-08 DIAGNOSIS — E860 Dehydration: Secondary | ICD-10-CM | POA: Diagnosis present

## 2016-06-08 DIAGNOSIS — M0680 Other specified rheumatoid arthritis, unspecified site: Secondary | ICD-10-CM | POA: Diagnosis not present

## 2016-06-08 DIAGNOSIS — Z888 Allergy status to other drugs, medicaments and biological substances status: Secondary | ICD-10-CM | POA: Diagnosis not present

## 2016-06-08 DIAGNOSIS — Z8249 Family history of ischemic heart disease and other diseases of the circulatory system: Secondary | ICD-10-CM | POA: Diagnosis not present

## 2016-06-08 DIAGNOSIS — E109 Type 1 diabetes mellitus without complications: Secondary | ICD-10-CM | POA: Diagnosis not present

## 2016-06-08 DIAGNOSIS — Z5181 Encounter for therapeutic drug level monitoring: Secondary | ICD-10-CM | POA: Diagnosis not present

## 2016-06-08 DIAGNOSIS — F329 Major depressive disorder, single episode, unspecified: Secondary | ICD-10-CM | POA: Diagnosis not present

## 2016-06-08 DIAGNOSIS — F331 Major depressive disorder, recurrent, moderate: Secondary | ICD-10-CM | POA: Diagnosis not present

## 2016-06-08 DIAGNOSIS — H6692 Otitis media, unspecified, left ear: Secondary | ICD-10-CM | POA: Diagnosis not present

## 2016-06-08 DIAGNOSIS — D649 Anemia, unspecified: Secondary | ICD-10-CM | POA: Diagnosis not present

## 2016-06-08 DIAGNOSIS — R278 Other lack of coordination: Secondary | ICD-10-CM | POA: Diagnosis not present

## 2016-06-08 DIAGNOSIS — I639 Cerebral infarction, unspecified: Secondary | ICD-10-CM | POA: Diagnosis not present

## 2016-06-08 DIAGNOSIS — Z955 Presence of coronary angioplasty implant and graft: Secondary | ICD-10-CM | POA: Diagnosis not present

## 2016-06-08 LAB — BASIC METABOLIC PANEL
ANION GAP: 8 (ref 5–15)
BUN: 22 mg/dL — ABNORMAL HIGH (ref 6–20)
CALCIUM: 9.4 mg/dL (ref 8.9–10.3)
CHLORIDE: 100 mmol/L — AB (ref 101–111)
CO2: 27 mmol/L (ref 22–32)
Creatinine, Ser: 0.64 mg/dL (ref 0.44–1.00)
GFR calc non Af Amer: >60 mL/min (ref >60)
GLUCOSE: 145 mg/dL — AB (ref 65–99)
POTASSIUM: 4.5 mmol/L (ref 3.5–5.1)
Sodium: 135 mmol/L (ref 135–145)

## 2016-06-08 LAB — PROTIME-INR
INR: 1.9 — AB (ref 0.00–1.49)
Prothrombin Time: 21.7 seconds — ABNORMAL HIGH (ref 11.6–15.2)

## 2016-06-08 LAB — CBC
HEMATOCRIT: 33.9 % — AB (ref 36.0–46.0)
HEMOGLOBIN: 11 g/dL — AB (ref 12.0–15.0)
MCH: 28.4 pg (ref 26.0–34.0)
MCHC: 32.4 g/dL (ref 30.0–36.0)
MCV: 87.4 fL (ref 78.0–100.0)
Platelets: 319 10*3/uL (ref 150–400)
RBC: 3.88 MIL/uL (ref 3.87–5.11)
RDW: 17.2 % — ABNORMAL HIGH (ref 11.5–15.5)
WBC: 5.7 10*3/uL (ref 4.0–10.5)

## 2016-06-08 LAB — GLUCOSE, CAPILLARY
Glucose-Capillary: 226 mg/dL — ABNORMAL HIGH (ref 65–99)
Glucose-Capillary: 102 mg/dL — ABNORMAL HIGH (ref 65–99)
Glucose-Capillary: 354 mg/dL — ABNORMAL HIGH (ref 65–99)

## 2016-06-08 LAB — INSULIN ANTIBODIES, BLOOD: Insulin Antibodies, Human: 59 uU/mL — ABNORMAL HIGH

## 2016-06-08 MED ORDER — WARFARIN SODIUM 5 MG PO TABS: 7.5000 mg | ORAL_TABLET | Freq: Once | ORAL | Status: DC

## 2016-06-08 MED ORDER — ENOXAPARIN SODIUM 60 MG/0.6ML ~~LOC~~ SOLN: 50.0000 mg | Freq: Two times a day (BID) | SUBCUTANEOUS | Status: DC

## 2016-06-08 MED ORDER — INSULIN DETEMIR 100 UNIT/ML ~~LOC~~ SOLN: 9.0000 [IU] | Freq: Two times a day (BID) | SUBCUTANEOUS | Status: DC

## 2016-06-08 MED ORDER — INSULIN ASPART 100 UNIT/ML ~~LOC~~ SOLN
7.0000 [IU] | Freq: Once | SUBCUTANEOUS | Status: AC
  Administered 2016-06-08: 7 [IU] via SUBCUTANEOUS

## 2016-06-08 MED ORDER — INSULIN ASPART 100 UNIT/ML FLEXPEN: 7.0000 [IU] | PEN_INJECTOR | Freq: Three times a day (TID) | SUBCUTANEOUS | Status: DC

## 2016-06-08 MED ORDER — INSULIN ASPART 100 UNIT/ML FLEXPEN: 5.0000 [IU] | PEN_INJECTOR | Freq: Three times a day (TID) | SUBCUTANEOUS | Status: DC

## 2016-06-08 NOTE — Telephone Encounter (Signed)
Error

## 2016-06-08 NOTE — Telephone Encounter (Signed)
Ok to refill 

## 2016-06-08 NOTE — Progress Notes (Signed)
Pt discharged from the unit via [facility]. Discharge packet was given to the pt. Report was called to [name]. RN on her lunch break and report will be called when she returns from lunch. No questions or concerns from the pt at this time.  Leilyn Frayre W Aleene Swanner, RN

## 2016-06-08 NOTE — Discharge Summary (Addendum)
Physician Discharge Summary  RANDALL RAMPERSAD HFW:[id number] DOB: 11/25/1960 DOA: 06/05/2016  PCP: Dorothyann Peng, NP  Admit date: 06/05/2016 Discharge date: 06/08/2016  Admitted From: SNF Disposition:  SNF  Recommendations for Outpatient Follow-up:  1. Please obtain BMP/CBC in one week, Check INR daily. 2. Continue Lovenox 50 mg twice a day until INR is between 2.5-3.5 for mechanical aortic mitral valve. 3. Levemir insulin changed to 9 units twice a day with NovoLog 7 units with meals.  Discharge Condition: Stable CODE STATUS: Full code Diet recommendation: Carb modified  Brief/Interim Summary: Madison [name] is a 55 y.o. female with a past medical history significant for IDDM with recurrent DKA in the last 6 months, previous strokes with residual L sided weakness/dysarthria, and hx of AV repair on warfarin who presents with hyperglycemia.  The patient was just discharged from the hospital yesterday after being admitted for DKA again. On the day of discharge, her BG was 72, she was on a strict diabetic diet, and had been getting Levemir 20 units daily with sliding scale corrections. She felt fine at discharge and after and describes that she "ate nothing other than what they gave me" and got all her insulin shots at the SNF yesterday. However, today her BG was >500, so they called the doctor at the SNF who recommended she be sent to the ER.   Discharge Diagnoses:  Principal Problem:   Hyperglycemia Active Problems:   Essential hypertension   Normocytic anemia   Chronic diastolic heart failure (HCC)   Hyponatremia: Pseudo   Brittle diabetes mellitus (HCC)   Malnutrition of moderate degree   Hyperglycemia in IDDM:  -Admitted now 14th time since January for hyperglycemia or DKA, several times requiring ICU admission.  -Prior to discharge she was on 20 units, but she was discharged on 8 units of insulin. -Initially started on insulin drip, no acidosis or ketosis, switched to Levemir  insulin. -Had wide range of blood sugar and sometimes unpredictable response to insulin. -Discharged on 9 units of Levemir twice a day and NovoLog 7 units with meals. -Please continue to check CBGs before meals and at bedtime.  Leukocytosis,? SIRS -This is likely secondary to the stress from the DKA. No evidence of infection -CXR is clear. Previous urine and blood cultures are without growth over last two months.  -TSH, T4 normal, ACTH stimulation appropriate.  -On the 15th WBC were 15.2, this is resolved WBC on discharge is 5.7.  Hyponatremia:  -Likely pseudohyponatremia from the hyperglycemia, this is resolved.  Acute kidney injury:  -Presented with creatinine of 1.08, creatinine down to 0.7 with hydration.  Chronic diastolic CHF and HTN:  -Furosemide held on admission, restarted back.  Chronic normocytic anemia:  -Stable  History of AVR and stroke: -Patient is on INR on Lovenox bridge. -INR is 1.9 on discharge, please continue Lovenox until INR is therapeutic between 2.5 and 3.5. -Continue Coumadin at 5 mg daily.  Chronic malnutrition, moderate degree: -Continue glucerna  Hx of depression: -Continue Celexa  Imaging finding of distal esophageal thickening -CT scan from 03/24/2016 showed distal esophageal thickening -GERD versus distal esophageal mass, patient is on omeprazole, continued. -Patient needs referral to GI as outpatient for consideration of endoscopic evaluation.   Discharge Instructions      Discharge Instructions    Diet - low sodium heart healthy    Complete by:  As directed      Increase activity slowly    Complete by:  As directed  Medication List    TAKE these medications        citalopram 40 MG tablet  Commonly known as:  CELEXA  Take 1 tablet (40 mg total) by mouth daily.     enoxaparin 60 MG/0.6ML injection  Commonly known as:  LOVENOX  Inject 0.5 mLs (50 mg total) into the skin every 12 (twelve) hours.     feeding  supplement (GLUCERNA SHAKE) Liqd  Take 237 mLs by mouth 3 (three) times daily between meals.     furosemide 20 MG tablet  Commonly known as:  LASIX  Take 1 tablet (20 mg total) by mouth daily.     glucose blood test strip  Use as instructed     insulin aspart 100 UNIT/ML FlexPen  Commonly known as:  NOVOLOG FLEXPEN  Inject 7 Units into the skin 3 (three) times daily with meals.     insulin detemir 100 UNIT/ML injection  Commonly known as:  LEVEMIR  Inject 0.09 mLs (9 Units total) into the skin 2 (two) times daily.     omeprazole 40 MG capsule  Commonly known as:  PRILOSEC  TAKE ONE CAPSULE BY MOUTH EVERY DAY     ONE TOUCH BASIC SYSTEM w/Device Kit  Use to check glucose 2x per day     Vitamin D (Ergocalciferol) 50000 units Caps capsule  Commonly known as:  DRISDOL  Take 50,000 Units by mouth every 7 (seven) days.     warfarin 5 MG tablet  Commonly known as:  COUMADIN  Take 5 mg by mouth as directed. Instructions: Take 1 tablet daily for 2 Days then Recheck PT/INR on 06/07/16.       Follow-up Information    Follow up with [facility] .   Specialty:  [place] information:   [facility] [place] [phone]     Allergies  Allergen Reactions  . Adhesive [Tape] Other (See Comments)    Can burn skin if left on too long  . Celebrex [Celecoxib] Rash  . Detrol [Tolterodine] Hives    Consultations:  None   Procedures/Studies: Dg Chest 2 View  06/01/2016  CLINICAL DATA:  Hyperglycemia, vomiting this morning, increased weakness, prior LEFT side stroke EXAM: CHEST  2 VIEW COMPARISON:  05/25/2016 FINDINGS: Normal heart size post AVR. Mediastinal contours and pulmonary vascularity normal. Lungs clear. No pleural effusion or pneumothorax. Bones unremarkable. IMPRESSION: Post AVR. No acute abnormalities. Electronically Signed   By: Lavonia Dana M.D.   On: 06/01/2016 11:39   Dg Chest 2  View  05/19/2016  CLINICAL DATA:  Followup right upper lobe infiltrate. EXAM: CHEST  2 VIEW COMPARISON:  05/17/2016 FINDINGS: Cardiac shadow is within normal limits. Postsurgical changes are again seen. The lungs are well aerated without focal infiltrate. Mild interstitial edema is seen. Small bilateral pleural with. IMPRESSION: Mild interstitial edema and small pleural effusions. Electronically Signed   By: Inez Catalina M.D.   On: 05/19/2016 10:19   Dg Chest Port 1 View  05/25/2016  CLINICAL DATA:  Altered mental status as well is hyperglycemia EXAM: PORTABLE CHEST 1 VIEW COMPARISON:  05/19/2016 FINDINGS: Sternotomy wires overlie normal cardiac silhouette. Normal pulmonary vasculature. No effusion, infiltrate, or pneumothorax. No acute osseous abnormality. IMPRESSION: No acute cardiopulmonary process. Electronically Signed   By: Suzy Bouchard M.D.   On: 05/25/2016 19:32   Dg Chest Port 1 View  05/17/2016  CLINICAL DATA:  Hyperglycemia and hypotension EXAM: PORTABLE CHEST 1 VIEW COMPARISON:  05/10/2016 FINDINGS: Mild airspace opacity in the right upper lobe may represent early infectious infiltrate or aspiration. The left lung is clear. There is no large effusion. The pulmonary vasculature is normal. IMPRESSION: Question early infectious infiltrate or aspiration in the right upper lobe. Electronically Signed   By: Andreas Newport M.D.   On: 05/17/2016 03:36   Dg Chest Port 1 View  05/10/2016  CLINICAL DATA:  Diabetic ketoacidosis. EXAM: PORTABLE CHEST 1 VIEW COMPARISON:  05/02/2016 FINDINGS: Sternotomy wires are unchanged. Interval removal of nasogastric tube and endotracheal tube. Lungs are adequately inflated and otherwise clear. Cardiomediastinal silhouette and remainder of the exam is unchanged. IMPRESSION: No active disease. Electronically Signed   By: Marin Olp M.D.   On: 05/10/2016 13:39    Subjective: Discharge Exam: Filed Vitals:   06/07/16 2037 06/08/16 0520  BP: 111/66 135/55   Pulse: 92 71  Temp: 98.5 F (36.9 C) 97.6 F (36.4 C)  Resp: 18 71   Filed Vitals:   06/07/16 1512 06/07/16 2037 06/08/16 0520 06/08/16 0549  BP: 139/57 111/66 135/55   Pulse: 88 92 71   Temp: 98.6 F (37 C) 98.5 F (36.9 C) 97.6 F (36.4 C)   TempSrc: Oral Oral Oral   Resp: 18 18 71   Height:      Weight:    52.1 kg (114 lb 13.8 oz)  SpO2: 99% 98% 100%     General: Pt is alert, awake, not in acute distress Cardiovascular: RRR, S1/S2 +, no rubs, no gallops Respiratory: CTA bilaterally, no wheezing, no rhonchi Abdominal: Soft, NT, ND, bowel sounds + Extremities: no edema, no cyanosis    The results of significant diagnostics from this hospitalization (including imaging, microbiology, ancillary and laboratory) are listed below for reference.     Microbiology: Recent Results (from the past 240 hour(s))  Culture, blood (routine x 2)     Status: None   Collection Time: 06/02/16  9:26 AM  Result Value Ref Range Status   Specimen Description BLOOD LEFT [facility]  Final   Special Requests IN PEDIATRIC BOTTLE [place]  Final   Culture   Final    NO GROWTH 5 DAYS Performed at [facility]    Report Status 06/07/2016 FINAL  Final  Culture, blood (routine x 2)     Status: None   Collection Time: 06/02/16 12:24 PM  Result Value Ref Range Status   Specimen Description BLOOD LEFT [facility]  Final   Special Requests IN PEDIATRIC BOTTLE [place]  Final   Culture   Final    NO GROWTH 5 DAYS Performed at [facility]    Report Status 06/07/2016 FINAL  Final  Culture, blood (Routine X 2) w Reflex to ID Panel     Status: None (Preliminary result)   Collection Time: 06/06/16  2:34 AM  Result Value Ref Range Status   Specimen Description BLOOD LEFT FOREARM  Final   Special Requests IN PEDIATRIC BOTTLE 3CC  Final   Culture   Final    NO GROWTH 2 DAYS Performed at [facility]    Report Status PENDING  Incomplete  Culture, blood (Routine X 2) w Reflex to ID Panel     Status:  None (Preliminary result)   Collection Time: 06/06/16  2:43 AM  Result Value Ref Range Status   Specimen Description BLOOD LEFT HAND  Final   Special Requests IN PEDIATRIC BOTTLE [facility]  Final   Culture   Final    NO GROWTH 2  DAYS Performed at [facility]    Report Status PENDING  Incomplete     Labs: BNP (last 3 results)  Recent Labs  03/17/16 1801 03/27/16 0832 05/25/16 1805  BNP 95.3 73.7 24.4   Basic Metabolic Panel:  Recent Labs Lab 06/02/16 1224 06/02/16 1622 06/05/16 2042 06/06/16 0655 06/08/16 0543  NA 133* 134* 128* 132* 135  K 4.2 4.4 4.8 4.5 4.5  CL 103 105 95* 98* 100*  CO2 22 20* 20* 22 27  GLUCOSE 99 102* 402* 192* 145*  BUN '8 7 16 11 ' 22*  CREATININE 0.66 0.84 1.08* 0.71 0.64  CALCIUM 8.7* 8.5* 9.3 8.9 9.4   Liver Function Tests:  Recent Labs Lab 06/02/16 0305 06/05/16 2042  AST 20 35  ALT 12* 22  ALKPHOS 84 128*  BILITOT 0.9 0.6  PROT 5.4* 6.8  ALBUMIN 2.8* 3.5   No results for input(s): LIPASE, AMYLASE in the last 168 hours. No results for input(s): AMMONIA in the last 168 hours. CBC:  Recent Labs Lab 06/02/16 0305 06/05/16 2042 06/08/16 0543  WBC 15.2* 7.1 5.7  NEUTROABS  --  4.2  --   HGB 9.1* 10.8* 11.0*  HCT 27.1* 33.6* 33.9*  MCV 86.3 88.2 87.4  PLT 313 300 319   Cardiac Enzymes:  Recent Labs Lab 06/06/16 0234  TROPONINI <0.03   BNP: Invalid input(s): POCBNP CBG:  Recent Labs Lab 06/07/16 1740 06/07/16 2041 06/08/16 0242 06/08/16 0741 06/08/16 1153  GLUCAP 182* 241* 226* 102* 354*   D-Dimer No results for input(s): DDIMER in the last 72 hours. Hgb A1c No results for input(s): HGBA1C in the last 72 hours. Lipid Profile No results for input(s): CHOL, HDL, LDLCALC, TRIG, CHOLHDL, LDLDIRECT in the last 72 hours. Thyroid function studies  Recent Labs  06/06/16 0243  TSH 1.484   Anemia work up No results for input(s): VITAMINB12, FOLATE, FERRITIN, TIBC, IRON, RETICCTPCT in the last 72  hours. Urinalysis    Component Value Date/Time   COLORURINE YELLOW 06/05/2016 2050   APPEARANCEUR CLEAR 06/05/2016 2050   LABSPEC 1.017 06/05/2016 2050   PHURINE 5.5 06/05/2016 2050   GLUCOSEU >1000* 06/05/2016 2050   HGBUR NEGATIVE 06/05/2016 2050   [place] NEGATIVE 06/05/2016 2050   BILIRUBINUR n 06/16/2015 1244   KETONESUR 15* 06/05/2016 2050   PROTEINUR NEGATIVE 06/05/2016 2050   PROTEINUR n 06/16/2015 1244   UROBILINOGEN 0.2 07/30/2015 1415   UROBILINOGEN 0.2 06/16/2015 1244   NITRITE NEGATIVE 06/05/2016 2050   NITRITE n 06/16/2015 1244   LEUKOCYTESUR NEGATIVE 06/05/2016 2050   Sepsis Labs Invalid input(s): PROCALCITONIN,  WBC,  LACTICIDVEN Microbiology Recent Results (from the past 240 hour(s))  Culture, blood (routine x 2)     Status: None   Collection Time: 06/02/16  9:26 AM  Result Value Ref Range Status   Specimen Description BLOOD LEFT [facility]  Final   Special Requests IN PEDIATRIC BOTTLE [place]  Final   Culture   Final    NO GROWTH 5 DAYS Performed at [facility]    Report Status 06/07/2016 FINAL  Final  Culture, blood (routine x 2)     Status: None   Collection Time: 06/02/16 12:24 PM  Result Value Ref Range Status   Specimen Description BLOOD LEFT [facility]  Final   Special Requests IN PEDIATRIC BOTTLE [facility]  Final   Culture   Final    NO GROWTH 5 DAYS Performed at [facility]    Report Status 06/07/2016 FINAL  Final  Culture, blood (Routine X 2) w Reflex to ID Panel     Status: None (Preliminary result)   Collection Time: 06/06/16  2:34 AM  Result Value Ref Range Status   Specimen Description BLOOD LEFT FOREARM  Final   Special Requests IN PEDIATRIC BOTTLE 3CC  Final   Culture   Final    NO GROWTH 2 DAYS Performed at [facility]    Report Status PENDING  Incomplete  Culture, blood (Routine X 2) w Reflex to ID Panel     Status: None (Preliminary result)   Collection Time: 06/06/16  2:43 AM  Result Value Ref Range Status   Specimen  Description BLOOD LEFT HAND  Final   Special Requests IN PEDIATRIC BOTTLE 2CC  Final   Culture   Final    NO GROWTH 2 DAYS Performed at [facility]    Report Status PENDING  Incomplete     Time coordinating discharge: Over 30 minutes  SIGNED:   Birdie Hopes, MD  [facility] 06/08/2016, 12:26 PM Pager   If 7PM-7AM, please contact night-coverage [email or web address] Password [email or web address]

## 2016-06-08 NOTE — Progress Notes (Signed)
ANTICOAGULATION CONSULT NOTE   Pharmacy Consult for Warfarin & Lovenox Indication: Mechanical Valve Replacement  Allergies  Allergen Reactions  . Adhesive [Tape] Other (See Comments)    Can burn skin if left on too long  . Celebrex [Celecoxib] Rash  . Detrol [Tolterodine] Hives    Patient Measurements: Height: 5\' 3"  (160 cm) Weight: 114 lb 13.8 oz (52.1 kg) IBW/kg (Calculated) : 52.4  Vital Signs: Temp: 97.6 F (36.4 C) (07/21 0520) Temp Source: Oral (07/21 0520) BP: 135/55 mmHg (07/21 0520) Pulse Rate: 71 (07/21 0520)  Labs:  Recent Labs  06/05/16 2042 06/06/16 0234 06/06/16 0243 06/06/16 0655 06/07/16 0548 06/08/16 0543  HGB 10.8*  --   --   --   --  11.0*  HCT 33.6*  --   --   --   --  33.9*  PLT 300  --   --   --   --  319  LABPROT  --   --  18.5*  --  20.4* 21.7*  INR  --   --  1.54*  --  1.74* 1.90*  CREATININE 1.08*  --   --  0.71  --  0.64  TROPONINI  --  <0.03  --   --   --   --     Estimated Creatinine Clearance: 65.4 mL/min (by C-G formula based on Cr of 0.64).   Medications:  Scheduled:  . citalopram  40 mg Oral Daily  . enoxaparin (LOVENOX) injection  50 mg Subcutaneous Q12H  . feeding supplement (GLUCERNA SHAKE)  237 mL Oral TID BM  . furosemide  20 mg Oral Daily  . insulin aspart  5 Units Subcutaneous TID WC  . insulin detemir  10 Units Subcutaneous BID  . Warfarin - Pharmacist Dosing Inpatient   Does not apply q1800   Infusions:     Assessment: 33 yoF known to pharmacy for anticoagulation management from recent hospitalization (discharged 7/17).  Currently readmitted for hyperglycemia.  Patient has history of being admitted with subtherapeutic INR exacerbated by frequent transitions of care and dose change.  Due to subtherapeutic INR at admission, Pharmacy was consulted to bridge with Lovenox until therapeutic.  Today, 06/08/2016:  INR remains subtherapeutic at 1.9 following two boosted doses  CBC stable this AM, no reports of  bleeding  No major drug interactions  Goal of Therapy:  INR 2.5-3.5   Plan:   Planning for discharge today  Continue Lovenox 50 mg sq q12h until INR therapeutic (> 2.5)  Warfarin 7.5 mg po x 1 today if not discharged first  From prior records, estimate patient's coumadin needs to be ~ 5 mg daily.  Note patient recently admitted with high INR on 7.5 mg daily  Follow daily INR  CBC at least q72h while on [facility] In hospital  [facility], PharmD, BCPS Pager: [phone] 06/08/2016, 9:51 AM

## 2016-06-08 NOTE — Clinical Social Work Placement (Signed)
   CLINICAL SOCIAL WORK PLACEMENT  NOTE  Date:  06/08/2016  Patient Details  Name: Madison [name] MRN: [id number] Date of Birth: 04-Oct-1961  Clinical Social Work is seeking post-discharge placement for this patient at the Skilled  Nursing Facility level of care (*CSW will initial, date and re-position this form in  chart as items are completed):  No   Patient/family provided with [facility] Clinical Social Work Department's list of facilities offering this level of care within the geographic area requested by the patient (or if unable, by the patient's family).  Yes   Patient/family informed of their freedom to choose among providers that offer the needed level of care, that participate in Medicare, Medicaid or managed care program needed by the patient, have an available bed and are willing to accept the patient.      Patient/family informed of [facility]'s ownership interest in [facility] and [facility], as well as of the fact that they are under no obligation to receive care at these facilities.  PASRR submitted to EDS on       PASRR number received on       Existing PASRR number confirmed on 06/08/16     FL2 transmitted to all facilities in geographic area requested by pt/family on       FL2 transmitted to all facilities within larger geographic area on       Patient informed that his/her managed care company has contracts with or will negotiate with certain facilities, including the following:  [name]         Patient/family informed of bed offers received.  Patient chooses bed at [facility]     Physician recommends and patient chooses bed at      Patient to be transferred to [facility] on 06/08/16.  Patient to be transferred to facility by [facility]     Patient family notified on 06/08/16 of transfer.  Name of family member notified:  Summers,Tracy: attempted to leave message no voicemail set up.      PHYSICIAN       Additional Comment:  Patient is returning to [name]  _______________________________________________ Clearance Coots, LCSW 06/08/2016, 9:35 AM

## 2016-06-08 NOTE — Consult Note (Signed)
   [facility] CM Inpatient Consult   06/08/2016  Madison [name] [birth year] [id number]   Chart reviewed. Noted [name] is returning to [facility] today. Will alert to [facility] team.   Raiford Noble, MSN-Ed, RN,BSN [facility] Liaison [phone]

## 2016-06-11 ENCOUNTER — Encounter: Payer: Commercial Managed Care - HMO | Admitting: Adult Health

## 2016-06-11 ENCOUNTER — Other Ambulatory Visit: Payer: Self-pay | Admitting: *Deleted

## 2016-06-11 ENCOUNTER — Encounter: Payer: Self-pay | Admitting: *Deleted

## 2016-06-11 LAB — CULTURE, BLOOD (ROUTINE X 2)
CULTURE: NO GROWTH
Culture: NO GROWTH

## 2016-06-12 ENCOUNTER — Ambulatory Visit: Payer: Self-pay

## 2016-06-12 DIAGNOSIS — E43 Unspecified severe protein-calorie malnutrition: Secondary | ICD-10-CM | POA: Diagnosis not present

## 2016-06-12 DIAGNOSIS — M8080XD Other osteoporosis with current pathological fracture, unspecified site, subsequent encounter for fracture with routine healing: Secondary | ICD-10-CM | POA: Diagnosis not present

## 2016-06-12 DIAGNOSIS — I2510 Atherosclerotic heart disease of native coronary artery without angina pectoris: Secondary | ICD-10-CM | POA: Diagnosis not present

## 2016-06-12 DIAGNOSIS — M0680 Other specified rheumatoid arthritis, unspecified site: Secondary | ICD-10-CM | POA: Diagnosis not present

## 2016-06-12 DIAGNOSIS — F331 Major depressive disorder, recurrent, moderate: Secondary | ICD-10-CM | POA: Diagnosis not present

## 2016-06-12 DIAGNOSIS — I5042 Chronic combined systolic (congestive) and diastolic (congestive) heart failure: Secondary | ICD-10-CM | POA: Diagnosis not present

## 2016-06-12 DIAGNOSIS — E1069 Type 1 diabetes mellitus with other specified complication: Secondary | ICD-10-CM | POA: Diagnosis not present

## 2016-06-12 DIAGNOSIS — E1010 Type 1 diabetes mellitus with ketoacidosis without coma: Secondary | ICD-10-CM | POA: Diagnosis not present

## 2016-06-12 NOTE — Patient Outreach (Signed)
[name], LCSW Social Worker (Auto-Saved)   Patient Outreach Date of Service: 06/11/2016 11:00 AM      '[]' Hide copied text '[]' Hover for attribution information [facility]) Care Management  06/11/2016  Madison [name] 1961/07/26 [id number]   CSW was able to make contact with patient today to assess and assist with social work needs and services, when [place] met with patient at [facility] where patient currently resides to receive short-term rehabilitative services.  CSW obtained two HIPAA compliant identifiers from patient, which included patient's name and date of birth. Patient admits that she is unaware of her current plan of care, but is somewhat insist on returning home to live independently at time of discharge.  CSW spoke with patient at length about various skilled nursing facilities that offer long-term care services, providing patient with a list for her review.  CSW also provided patient with a ist of home health agencies offering home care services, in the event that patient decides to return home to live at time of discharge from the skilled facility. Patient agreed to have a Goals of Care Meeting, and is willing to include her mother and sister, along with the multidisciplinary team members at [facility].  CSW agreed to coordinate this meeting, along with the assistance of Malena Peer, Admissions Coordinator/Licensed Clinical Social Worker at the facility.  CSW will converse with patient's RNCM with [facility], Erenest Rasher to report findings of CSW's meeting with patient today. Nat Christen, BSW, MSW, LCSW  Licensed Education officer, environmental Health System  Mailing [place] [address] [place], [place] [address] Physical Address-[address], [place], [place] [address] Toll Free Main # [phone] Fax #  [phone] Cell # [phone]  Fax # [phone]  [email or web address]

## 2016-06-12 NOTE — Telephone Encounter (Signed)
Ok to refill 

## 2016-06-13 ENCOUNTER — Other Ambulatory Visit: Payer: Commercial Managed Care - HMO

## 2016-06-14 DIAGNOSIS — E109 Type 1 diabetes mellitus without complications: Secondary | ICD-10-CM | POA: Diagnosis not present

## 2016-06-14 DIAGNOSIS — Z952 Presence of prosthetic heart valve: Secondary | ICD-10-CM | POA: Diagnosis not present

## 2016-06-14 DIAGNOSIS — E785 Hyperlipidemia, unspecified: Secondary | ICD-10-CM | POA: Diagnosis not present

## 2016-06-14 DIAGNOSIS — I10 Essential (primary) hypertension: Secondary | ICD-10-CM | POA: Diagnosis not present

## 2016-06-18 ENCOUNTER — Encounter: Payer: Commercial Managed Care - HMO | Admitting: Adult Health

## 2016-06-19 ENCOUNTER — Other Ambulatory Visit: Payer: Self-pay | Admitting: *Deleted

## 2016-06-19 NOTE — Patient Outreach (Signed)
[facility]) Care Management  06/19/2016  AARALYN KIL 28-Apr-1961 [id number]   CSW was only able to make brief contact with patient today at [facility] where patient currently resides to receive short-term rehabilitative services, as patient was finishing up breakfast and being called to work with therapists (both physical and occupational).  CSW was able to meet with patient long enough to determine that patient still does not have a definitive discharge plan in place.  CSW encouraged patient to inform CSW when a Goals of Care Meeting is established, as CSW would like to be present.  CSW went on to say that CSW has left voicemail messages with Billey Chang, Licensed Clinical Social Worker/Admissions Coordinator at [employer] [facility] [employer], but has not received a return call.  CSW has also left messages for Ms. Shaw with the Receptionist at [employer], [place].  CSW is currently awaiting a return call.  Patient reports doing "fair" and indicated that she is getting the care that she needs at [name].  Patient was still not interested in talking with CSW about long-term care placement arrangements.  CSW will plan to meet with patient for the next scheduled visit at [facility] on Wednesday, August 16th, when CSW returns to the office from vacation. Danford Bad, BSW, MSW, LCSW  Licensed Restaurant manager, fast food Health System  Mailing [place] [address], [place], [place] [address] Physical Address-[address], [place], [place] [address] Toll Free Main # [phone] Fax # [phone] Cell # [phone]  Fax # [phone]  [name]

## 2016-06-25 ENCOUNTER — Other Ambulatory Visit: Payer: Self-pay

## 2016-06-26 ENCOUNTER — Other Ambulatory Visit: Payer: Self-pay

## 2016-06-26 NOTE — Patient Outreach (Signed)
[facility] ([place]) Care Management  06/26/2016  Madison [name] [birth year] [id number]   This RNCM was in attendance at patient's discharge planning meeting held at [name].    Also in attendance was Tars ha, Manufacturing engineer and a Holiday representative. Meet was held at facility, in patient Discussed patient's discharge, limited support, deficits in ALS and aids.  Given: Patient has been approved and receives 3 hours of [employer] 7 days a week. Patient has a minor son who is being taken care of by patient's sister, Madison [name].  Son has special needs  Patient lives in an apartment alone. Patient describes her apartment community is unsafe.  Patient has medical transportation through [facility] Patient has hemiparesis which limits her coordination.  This RNCM observed patient attempting to perform a glucose testing and was unable to perform the task.     Patient advised team she would like to go home.  Stated she was able to administer her own medications, and prepare meals for herself.   All disciplines available today expressed concerns related to patient living alone using her past medical admissions as support for concerns.  This RNCM advised patient her mother and sister would not be able to assist due to their obligations.  Tars ha, SW for [employer]' supported patient further by telling patient she would continue to look for an assisted living that would allow patient to bring her cat, in the meantime, patient could continue her stay at  [name].  Call made to [facility] Case Manager to update her.   Call made to [name], Raiford Noble, RN to advise her of outcomes of this meeting.

## 2016-06-26 NOTE — Patient Outreach (Signed)
    This RNCM made telephone contact with patient's sister Kennith Center to remind Kennith Center of the scheduled discharge planning meeting at [facility].    Kennith Center advised this RNCM she would not be attendance due to her overwhelming obligations caring for patient's son.  Kennith Center also added she would be available to assist patient on a very limited basis. Kennith Center stated she cannot take on any more responsibilities at this time.     This RNCM made telephone contact with patient's wife, Mrs. Kate Sable. Mrs. Clydene Pugh stated she would not be able to attend the meeting due to health issues.  Mrs. Clydene Pugh stated she thinks her daughter should go to an assisted living facility, has encouraged patient to cooperate with staff at skilled nursing facility in getting her placed in an assisted living facility.    Both Kennith Center and Mrs. Clydene Pugh were advised that patient had expressed a desire to go home and live alone.

## 2016-06-30 ENCOUNTER — Inpatient Hospital Stay (HOSPITAL_COMMUNITY)
Admission: EM | Admit: 2016-06-30 | Discharge: 2016-07-03 | DRG: 638 | Disposition: A | Discharge status: Skilled Nursing Facility | Payer: Commercial Managed Care - HMO | Attending: Family Medicine | Admitting: Family Medicine

## 2016-06-30 DIAGNOSIS — Z8249 Family history of ischemic heart disease and other diseases of the circulatory system: Secondary | ICD-10-CM | POA: Diagnosis not present

## 2016-06-30 DIAGNOSIS — K219 Gastro-esophageal reflux disease without esophagitis: Secondary | ICD-10-CM | POA: Diagnosis not present

## 2016-06-30 DIAGNOSIS — R791 Abnormal coagulation profile: Secondary | ICD-10-CM | POA: Diagnosis not present

## 2016-06-30 DIAGNOSIS — E1310 Other specified diabetes mellitus with ketoacidosis without coma: Secondary | ICD-10-CM | POA: Diagnosis not present

## 2016-06-30 DIAGNOSIS — E10649 Type 1 diabetes mellitus with hypoglycemia without coma: Secondary | ICD-10-CM | POA: Diagnosis present

## 2016-06-30 DIAGNOSIS — E860 Dehydration: Secondary | ICD-10-CM | POA: Diagnosis present

## 2016-06-30 DIAGNOSIS — R278 Other lack of coordination: Secondary | ICD-10-CM | POA: Diagnosis not present

## 2016-06-30 DIAGNOSIS — F32A Depression, unspecified: Secondary | ICD-10-CM | POA: Diagnosis present

## 2016-06-30 DIAGNOSIS — H9202 Otalgia, left ear: Secondary | ICD-10-CM | POA: Diagnosis present

## 2016-06-30 DIAGNOSIS — I639 Cerebral infarction, unspecified: Secondary | ICD-10-CM | POA: Diagnosis not present

## 2016-06-30 DIAGNOSIS — E875 Hyperkalemia: Secondary | ICD-10-CM | POA: Diagnosis present

## 2016-06-30 DIAGNOSIS — Z888 Allergy status to other drugs, medicaments and biological substances status: Secondary | ICD-10-CM | POA: Diagnosis not present

## 2016-06-30 DIAGNOSIS — Z823 Family history of stroke: Secondary | ICD-10-CM

## 2016-06-30 DIAGNOSIS — Z955 Presence of coronary angioplasty implant and graft: Secondary | ICD-10-CM | POA: Diagnosis not present

## 2016-06-30 DIAGNOSIS — Z79899 Other long term (current) drug therapy: Secondary | ICD-10-CM

## 2016-06-30 DIAGNOSIS — Z794 Long term (current) use of insulin: Secondary | ICD-10-CM

## 2016-06-30 DIAGNOSIS — I110 Hypertensive heart disease with heart failure: Secondary | ICD-10-CM | POA: Diagnosis present

## 2016-06-30 DIAGNOSIS — F329 Major depressive disorder, single episode, unspecified: Secondary | ICD-10-CM | POA: Diagnosis not present

## 2016-06-30 DIAGNOSIS — I2510 Atherosclerotic heart disease of native coronary artery without angina pectoris: Secondary | ICD-10-CM | POA: Diagnosis not present

## 2016-06-30 DIAGNOSIS — I5032 Chronic diastolic (congestive) heart failure: Secondary | ICD-10-CM | POA: Diagnosis not present

## 2016-06-30 DIAGNOSIS — E785 Hyperlipidemia, unspecified: Secondary | ICD-10-CM | POA: Diagnosis not present

## 2016-06-30 DIAGNOSIS — H6692 Otitis media, unspecified, left ear: Secondary | ICD-10-CM | POA: Diagnosis present

## 2016-06-30 DIAGNOSIS — I10 Essential (primary) hypertension: Secondary | ICD-10-CM | POA: Diagnosis present

## 2016-06-30 DIAGNOSIS — Z952 Presence of prosthetic heart valve: Secondary | ICD-10-CM | POA: Diagnosis not present

## 2016-06-30 DIAGNOSIS — M6281 Muscle weakness (generalized): Secondary | ICD-10-CM | POA: Diagnosis not present

## 2016-06-30 DIAGNOSIS — E1365 Other specified diabetes mellitus with hyperglycemia: Secondary | ICD-10-CM | POA: Diagnosis not present

## 2016-06-30 DIAGNOSIS — Z9109 Other allergy status, other than to drugs and biological substances: Secondary | ICD-10-CM | POA: Diagnosis not present

## 2016-06-30 DIAGNOSIS — G894 Chronic pain syndrome: Secondary | ICD-10-CM | POA: Diagnosis not present

## 2016-06-30 DIAGNOSIS — N179 Acute kidney failure, unspecified: Secondary | ICD-10-CM | POA: Diagnosis not present

## 2016-06-30 DIAGNOSIS — R2689 Other abnormalities of gait and mobility: Secondary | ICD-10-CM | POA: Diagnosis not present

## 2016-06-30 DIAGNOSIS — D72829 Elevated white blood cell count, unspecified: Secondary | ICD-10-CM | POA: Diagnosis present

## 2016-06-30 DIAGNOSIS — Z7901 Long term (current) use of anticoagulants: Secondary | ICD-10-CM

## 2016-06-30 DIAGNOSIS — I5042 Chronic combined systolic (congestive) and diastolic (congestive) heart failure: Secondary | ICD-10-CM | POA: Diagnosis not present

## 2016-06-30 DIAGNOSIS — E108 Type 1 diabetes mellitus with unspecified complications: Secondary | ICD-10-CM

## 2016-06-30 DIAGNOSIS — R531 Weakness: Secondary | ICD-10-CM | POA: Diagnosis not present

## 2016-06-30 DIAGNOSIS — E1069 Type 1 diabetes mellitus with other specified complication: Secondary | ICD-10-CM | POA: Diagnosis not present

## 2016-06-30 DIAGNOSIS — E1010 Type 1 diabetes mellitus with ketoacidosis without coma: Secondary | ICD-10-CM | POA: Diagnosis not present

## 2016-06-30 DIAGNOSIS — Z8673 Personal history of transient ischemic attack (TIA), and cerebral infarction without residual deficits: Secondary | ICD-10-CM | POA: Diagnosis not present

## 2016-06-30 DIAGNOSIS — Z5181 Encounter for therapeutic drug level monitoring: Secondary | ICD-10-CM | POA: Diagnosis not present

## 2016-06-30 DIAGNOSIS — Z951 Presence of aortocoronary bypass graft: Secondary | ICD-10-CM

## 2016-06-30 DIAGNOSIS — R112 Nausea with vomiting, unspecified: Secondary | ICD-10-CM | POA: Diagnosis not present

## 2016-06-30 DIAGNOSIS — IMO0002 Reserved for concepts with insufficient information to code with codable children: Secondary | ICD-10-CM | POA: Diagnosis present

## 2016-06-30 DIAGNOSIS — E1065 Type 1 diabetes mellitus with hyperglycemia: Secondary | ICD-10-CM | POA: Diagnosis present

## 2016-06-30 NOTE — ED Notes (Signed)
Bed: WA09 Expected date:  Expected time:  Means of arrival:  Comments: Hyperglycemia

## 2016-06-30 NOTE — ED Triage Notes (Signed)
Brought in by [facility] from [facility] facility with c/o hyperglycemia.  Per EMS, facility reported that pt has had a very high CBG reading, and pt also has been having nausea and vomiting for 5 hours.  Pt's CBG was "HI" by EMS.

## 2016-07-01 ENCOUNTER — Encounter (HOSPITAL_COMMUNITY): Payer: Self-pay | Admitting: Emergency Medicine

## 2016-07-01 DIAGNOSIS — E10649 Type 1 diabetes mellitus with hypoglycemia without coma: Secondary | ICD-10-CM | POA: Diagnosis present

## 2016-07-01 DIAGNOSIS — H9202 Otalgia, left ear: Secondary | ICD-10-CM | POA: Diagnosis present

## 2016-07-01 DIAGNOSIS — I2510 Atherosclerotic heart disease of native coronary artery without angina pectoris: Secondary | ICD-10-CM | POA: Diagnosis present

## 2016-07-01 DIAGNOSIS — H6692 Otitis media, unspecified, left ear: Secondary | ICD-10-CM | POA: Diagnosis present

## 2016-07-01 DIAGNOSIS — Z9109 Other allergy status, other than to drugs and biological substances: Secondary | ICD-10-CM | POA: Diagnosis not present

## 2016-07-01 DIAGNOSIS — D72829 Elevated white blood cell count, unspecified: Secondary | ICD-10-CM | POA: Diagnosis present

## 2016-07-01 DIAGNOSIS — E860 Dehydration: Secondary | ICD-10-CM | POA: Diagnosis present

## 2016-07-01 DIAGNOSIS — I5032 Chronic diastolic (congestive) heart failure: Secondary | ICD-10-CM | POA: Diagnosis present

## 2016-07-01 DIAGNOSIS — Z7901 Long term (current) use of anticoagulants: Secondary | ICD-10-CM | POA: Diagnosis not present

## 2016-07-01 DIAGNOSIS — Z823 Family history of stroke: Secondary | ICD-10-CM | POA: Diagnosis not present

## 2016-07-01 DIAGNOSIS — Z8673 Personal history of transient ischemic attack (TIA), and cerebral infarction without residual deficits: Secondary | ICD-10-CM | POA: Diagnosis not present

## 2016-07-01 DIAGNOSIS — E1010 Type 1 diabetes mellitus with ketoacidosis without coma: Secondary | ICD-10-CM | POA: Diagnosis present

## 2016-07-01 DIAGNOSIS — I10 Essential (primary) hypertension: Secondary | ICD-10-CM | POA: Diagnosis not present

## 2016-07-01 DIAGNOSIS — I639 Cerebral infarction, unspecified: Secondary | ICD-10-CM | POA: Diagnosis not present

## 2016-07-01 DIAGNOSIS — F329 Major depressive disorder, single episode, unspecified: Secondary | ICD-10-CM | POA: Diagnosis present

## 2016-07-01 DIAGNOSIS — Z888 Allergy status to other drugs, medicaments and biological substances status: Secondary | ICD-10-CM | POA: Diagnosis not present

## 2016-07-01 DIAGNOSIS — Z951 Presence of aortocoronary bypass graft: Secondary | ICD-10-CM | POA: Diagnosis not present

## 2016-07-01 DIAGNOSIS — R112 Nausea with vomiting, unspecified: Secondary | ICD-10-CM | POA: Diagnosis present

## 2016-07-01 DIAGNOSIS — E785 Hyperlipidemia, unspecified: Secondary | ICD-10-CM | POA: Diagnosis present

## 2016-07-01 DIAGNOSIS — Z8249 Family history of ischemic heart disease and other diseases of the circulatory system: Secondary | ICD-10-CM | POA: Diagnosis not present

## 2016-07-01 DIAGNOSIS — K219 Gastro-esophageal reflux disease without esophagitis: Secondary | ICD-10-CM | POA: Diagnosis present

## 2016-07-01 DIAGNOSIS — N179 Acute kidney failure, unspecified: Secondary | ICD-10-CM | POA: Diagnosis present

## 2016-07-01 DIAGNOSIS — R791 Abnormal coagulation profile: Secondary | ICD-10-CM | POA: Diagnosis not present

## 2016-07-01 DIAGNOSIS — I110 Hypertensive heart disease with heart failure: Secondary | ICD-10-CM | POA: Diagnosis present

## 2016-07-01 DIAGNOSIS — Z952 Presence of prosthetic heart valve: Secondary | ICD-10-CM | POA: Diagnosis not present

## 2016-07-01 DIAGNOSIS — E875 Hyperkalemia: Secondary | ICD-10-CM | POA: Diagnosis present

## 2016-07-01 DIAGNOSIS — Z955 Presence of coronary angioplasty implant and graft: Secondary | ICD-10-CM | POA: Diagnosis not present

## 2016-07-01 LAB — BLOOD GAS, ARTERIAL
ACID-BASE DEFICIT: 9.6 mmol/L — AB (ref 0.0–2.0)
BICARBONATE: 14.6 meq/L — AB (ref 20.0–24.0)
Drawn by: 422461
FIO2: 0.21
O2 SAT: 97.3 %
PCO2 ART: 26.8 mmHg — AB (ref 35.0–45.0)
PO2 ART: 102 mmHg — AB (ref 80.0–100.0)
Patient temperature: 97.3
TCO2: 13.8 mmol/L (ref 0–100)
pH, Arterial: 7.353 (ref 7.350–7.450)

## 2016-07-01 LAB — URINE MICROSCOPIC-ADD ON: RBC / HPF: NONE SEEN RBC/hpf (ref 0–5)

## 2016-07-01 LAB — COMPREHENSIVE METABOLIC PANEL
ALBUMIN: 4.3 g/dL (ref 3.5–5.0)
ALT: 19 U/L (ref 14–54)
ANION GAP: 22 — AB (ref 5–15)
AST: 23 U/L (ref 15–41)
Alkaline Phosphatase: 105 U/L (ref 38–126)
BUN: 36 mg/dL — AB (ref 6–20)
CHLORIDE: 90 mmol/L — AB (ref 101–111)
CO2: 15 mmol/L — AB (ref 22–32)
Calcium: 9.5 mg/dL (ref 8.9–10.3)
Creatinine, Ser: 1.53 mg/dL — ABNORMAL HIGH (ref 0.44–1.00)
GFR calc Af Amer: 43 mL/min — ABNORMAL LOW (ref >60)
GFR calc non Af Amer: 37 mL/min — ABNORMAL LOW (ref >60)
GLUCOSE: 910 mg/dL — AB (ref 65–99)
POTASSIUM: 5.3 mmol/L — AB (ref 3.5–5.1)
SODIUM: 127 mmol/L — AB (ref 135–145)
Total Bilirubin: 1.4 mg/dL — ABNORMAL HIGH (ref 0.3–1.2)
Total Protein: 8.2 g/dL — ABNORMAL HIGH (ref 6.5–8.1)

## 2016-07-01 LAB — BASIC METABOLIC PANEL
ANION GAP: 12 (ref 5–15)
ANION GAP: 6 (ref 5–15)
Anion gap: 10 (ref 5–15)
BUN: 29 mg/dL — ABNORMAL HIGH (ref 6–20)
BUN: 26 mg/dL — ABNORMAL HIGH (ref 6–20)
BUN: 22 mg/dL — AB (ref 6–20)
CHLORIDE: 100 mmol/L — AB (ref 101–111)
CHLORIDE: 103 mmol/L (ref 101–111)
CHLORIDE: 103 mmol/L (ref 101–111)
CO2: 19 mmol/L — ABNORMAL LOW (ref 22–32)
CO2: 26 mmol/L (ref 22–32)
CO2: 21 mmol/L — ABNORMAL LOW (ref 22–32)
Calcium: 8.4 mg/dL — ABNORMAL LOW (ref 8.9–10.3)
Calcium: 8.9 mg/dL (ref 8.9–10.3)
Calcium: 8.9 mg/dL (ref 8.9–10.3)
Creatinine, Ser: 1.22 mg/dL — ABNORMAL HIGH (ref 0.44–1.00)
Creatinine, Ser: 0.79 mg/dL (ref 0.44–1.00)
Creatinine, Ser: 0.82 mg/dL (ref 0.44–1.00)
GFR, EST AFRICAN AMERICAN: 57 mL/min — AB (ref >60)
GFR, EST NON AFRICAN AMERICAN: 49 mL/min — AB (ref >60)
Glucose, Bld: 382 mg/dL — ABNORMAL HIGH (ref 65–99)
Glucose, Bld: 143 mg/dL — ABNORMAL HIGH (ref 65–99)
Glucose, Bld: 99 mg/dL (ref 65–99)
POTASSIUM: 3.6 mmol/L (ref 3.5–5.1)
POTASSIUM: 4 mmol/L (ref 3.5–5.1)
POTASSIUM: 4.4 mmol/L (ref 3.5–5.1)
SODIUM: 131 mmol/L — AB (ref 135–145)
SODIUM: 135 mmol/L (ref 135–145)
SODIUM: 134 mmol/L — AB (ref 135–145)

## 2016-07-01 LAB — GLUCOSE, CAPILLARY
GLUCOSE-CAPILLARY: 129 mg/dL — AB (ref 65–99)
GLUCOSE-CAPILLARY: 92 mg/dL (ref 65–99)
GLUCOSE-CAPILLARY: 153 mg/dL — AB (ref 65–99)
GLUCOSE-CAPILLARY: 287 mg/dL — AB (ref 65–99)
Glucose-Capillary: 245 mg/dL — ABNORMAL HIGH (ref 65–99)
Glucose-Capillary: 181 mg/dL — ABNORMAL HIGH (ref 65–99)
Glucose-Capillary: 145 mg/dL — ABNORMAL HIGH (ref 65–99)
Glucose-Capillary: 89 mg/dL (ref 65–99)
Glucose-Capillary: 80 mg/dL (ref 65–99)
Glucose-Capillary: 222 mg/dL — ABNORMAL HIGH (ref 65–99)
Glucose-Capillary: 232 mg/dL — ABNORMAL HIGH (ref 65–99)
Glucose-Capillary: 283 mg/dL — ABNORMAL HIGH (ref 65–99)

## 2016-07-01 LAB — CBC WITH DIFFERENTIAL/PLATELET
BASOS ABS: 0 10*3/uL (ref 0.0–0.1)
Basophils Relative: 0 %
EOS ABS: 0 10*3/uL (ref 0.0–0.7)
Eosinophils Relative: 0 %
HEMATOCRIT: 34 % — AB (ref 36.0–46.0)
Hemoglobin: 10.9 g/dL — ABNORMAL LOW (ref 12.0–15.0)
LYMPHS ABS: 0.8 10*3/uL (ref 0.7–4.0)
Lymphocytes Relative: 7 %
MCH: 27.9 pg (ref 26.0–34.0)
MCHC: 32.1 g/dL (ref 30.0–36.0)
MCV: 87 fL (ref 78.0–100.0)
MONO ABS: 0.4 10*3/uL (ref 0.1–1.0)
MONOS PCT: 4 %
Neutro Abs: 9.7 10*3/uL — ABNORMAL HIGH (ref 1.7–7.7)
Neutrophils Relative %: 89 %
PLATELETS: 338 10*3/uL (ref 150–400)
RBC: 3.91 MIL/uL (ref 3.87–5.11)
RDW: 15.7 % — AB (ref 11.5–15.5)
WBC: 10.9 10*3/uL — AB (ref 4.0–10.5)

## 2016-07-01 LAB — PROTIME-INR
INR: 2.66
Prothrombin Time: 28.9 seconds — ABNORMAL HIGH (ref 11.4–15.2)

## 2016-07-01 LAB — CREATININE, URINE, RANDOM: Creatinine, Urine: 20.15 mg/dL

## 2016-07-01 LAB — CBG MONITORING, ED
GLUCOSE-CAPILLARY: 373 mg/dL — AB (ref 65–99)
Glucose-Capillary: 557 mg/dL (ref 65–99)

## 2016-07-01 LAB — URINALYSIS, ROUTINE W REFLEX MICROSCOPIC
Bilirubin Urine: NEGATIVE
Glucose, UA: >1000 mg/dL — AB
Hgb urine dipstick: NEGATIVE
Ketones, ur: >80 mg/dL — AB
LEUKOCYTES UA: NEGATIVE
NITRITE: NEGATIVE
Protein, ur: NEGATIVE mg/dL
SPECIFIC GRAVITY, URINE: 1.027 (ref 1.005–1.030)
pH: 5 (ref 5.0–8.0)

## 2016-07-01 LAB — MRSA PCR SCREENING: MRSA BY PCR: NEGATIVE

## 2016-07-01 LAB — BRAIN NATRIURETIC PEPTIDE: B NATRIURETIC PEPTIDE 5: 116.4 pg/mL — AB (ref 0.0–100.0)

## 2016-07-01 LAB — LIPASE, BLOOD: LIPASE: 15 U/L (ref 11–51)

## 2016-07-01 MED ORDER — DEXTROSE-NACL 5-0.45 % IV SOLN
INTRAVENOUS | Status: DC
  Administered 2016-07-01: 05:00:00 via INTRAVENOUS

## 2016-07-01 MED ORDER — ACETAMINOPHEN 325 MG PO TABS: 650.0000 mg | ORAL_TABLET | Freq: Four times a day (QID) | ORAL | Status: DC | PRN

## 2016-07-01 MED ORDER — SODIUM CHLORIDE 0.9 % IV SOLN
INTRAVENOUS | Status: DC
  Administered 2016-07-01: 5.4 [IU]/h via INTRAVENOUS
  Filled 2016-07-01: qty 2.5

## 2016-07-01 MED ORDER — SODIUM CHLORIDE 0.9 % IV SOLN
INTRAVENOUS | Status: DC
  Administered 2016-07-01: 2.4 [IU]/h via INTRAVENOUS
  Filled 2016-07-01: qty 2.5

## 2016-07-01 MED ORDER — INSULIN ASPART 100 UNIT/ML ~~LOC~~ SOLN
0.0000 [IU] | Freq: Every day | SUBCUTANEOUS | Status: DC
  Administered 2016-07-02: 2 [IU] via SUBCUTANEOUS

## 2016-07-01 MED ORDER — WARFARIN - PHARMACIST DOSING INPATIENT
Freq: Every day | Status: DC
  Administered 2016-07-02: 18:00:00

## 2016-07-01 MED ORDER — SODIUM CHLORIDE 0.9 % IV BOLUS (SEPSIS)
500.0000 mL | Freq: Once | INTRAVENOUS | Status: AC
  Administered 2016-07-01: 500 mL via INTRAVENOUS

## 2016-07-01 MED ORDER — CITALOPRAM HYDROBROMIDE 20 MG PO TABS
40.0000 mg | ORAL_TABLET | Freq: Every day | ORAL | Status: DC
  Administered 2016-07-01 – 2016-07-03 (×3): 40 mg via ORAL
  Filled 2016-07-01: qty 1
  Filled 2016-07-01: qty 2
  Filled 2016-07-01: qty 1
  Filled 2016-07-01: qty 2

## 2016-07-01 MED ORDER — WARFARIN SODIUM 5 MG PO TABS
7.5000 mg | ORAL_TABLET | Freq: Once | ORAL | Status: AC
  Administered 2016-07-01: 7.5 mg via ORAL
  Filled 2016-07-01: qty 1

## 2016-07-01 MED ORDER — INSULIN DETEMIR 100 UNIT/ML ~~LOC~~ SOLN
9.0000 [IU] | Freq: Two times a day (BID) | SUBCUTANEOUS | Status: DC
  Administered 2016-07-01 – 2016-07-02 (×4): 9 [IU] via SUBCUTANEOUS
  Filled 2016-07-01 (×5): qty 0.09

## 2016-07-01 MED ORDER — SODIUM CHLORIDE 0.9 % IV BOLUS (SEPSIS)
1000.0000 mL | Freq: Once | INTRAVENOUS | Status: AC
  Administered 2016-07-01: 1000 mL via INTRAVENOUS

## 2016-07-01 MED ORDER — SODIUM CHLORIDE 0.9 % IV SOLN
INTRAVENOUS | Status: DC
  Administered 2016-07-01: 05:00:00 via INTRAVENOUS

## 2016-07-01 MED ORDER — DEXTROSE-NACL 5-0.45 % IV SOLN: INTRAVENOUS | Status: DC

## 2016-07-01 MED ORDER — PANTOPRAZOLE SODIUM 40 MG PO TBEC
40.0000 mg | DELAYED_RELEASE_TABLET | Freq: Every day | ORAL | Status: DC
  Administered 2016-07-01 – 2016-07-03 (×3): 40 mg via ORAL
  Filled 2016-07-01 (×3): qty 1

## 2016-07-01 MED ORDER — NEOMYCIN-POLYMYXIN-HC 1 % OT SOLN
3.0000 [drp] | Freq: Four times a day (QID) | OTIC | Status: DC
  Administered 2016-07-01 – 2016-07-03 (×10): 3 [drp] via OTIC
  Filled 2016-07-01: qty 10

## 2016-07-01 MED ORDER — HYDRALAZINE HCL 20 MG/ML IJ SOLN: 5.0000 mg | INTRAMUSCULAR | Status: DC | PRN

## 2016-07-01 MED ORDER — INSULIN ASPART 100 UNIT/ML ~~LOC~~ SOLN
0.0000 [IU] | Freq: Three times a day (TID) | SUBCUTANEOUS | Status: DC
  Administered 2016-07-01 – 2016-07-02 (×3): 5 [IU] via SUBCUTANEOUS
  Administered 2016-07-02: 1 [IU] via SUBCUTANEOUS
  Administered 2016-07-03: 7 [IU] via SUBCUTANEOUS

## 2016-07-01 MED ORDER — ONDANSETRON HCL 4 MG/2ML IJ SOLN: 4.0000 mg | Freq: Three times a day (TID) | INTRAMUSCULAR | Status: DC | PRN

## 2016-07-01 NOTE — H&P (Signed)
History and Physical    Madison [name] QZR:[id number] DOB: [birth year] DOA: 06/30/2016  Referring MD/NP/PA:   PCP: Dorothyann Peng, NP   Patient coming from:  [facility]  At baseline, pt is dependent for her ADL.   Chief Complaint: Nausea, vomiting, hyperglycemia, left ear pain  HPI: Madison [name] is a 55 y.o. female with medical history significant of type 1 diabetes, hypertension, hyperlipidemia, GERD, depression,CAD, s/p of stent and s/p of CABG, dCHF, stroke x5 with left arm weakness, s/p of AVR with mechanical valve, who presents with nausea, vomiting, hyperglycemia and the left ear pain.  Patient reports that she started having nausea, vomiting last night. No abdominal pain or diarrhea. She vomited more than 5 times. She states that her blood sugar has been difficult to control. Pt reports she has experienced similar symptoms due to hyperglycemia in the past. She also has left ear pain which has been going on for 3 days. The pain is constant, severe, nonradiating. No ear drainage or hearing loss. Patient does not have fever or chills. No chest pain, shortness breath, cough, symptoms of UTI. She has left arm weakness due to stroke, no new unilateral weakness. No vision change or hearing loss.  ED Course: pt was found to have blood sugar 910 with elevated AG 22, potassium 5.3, pseudohyponatremia, AKI with creatinine 1.53, negative urinalysis, WBC 10.9, temperature normal, tachycardia. ABG with pH 7.353, PCO2 26.8, PO2 102. Patient is admitted to stepdown as inpatient for further interventional treatment. Insulin drip was started in the emergency room.  Review of Systems:   General: no fevers, chills, no changes in body weight, has poor appetite, has fatigue HEENT: no blurry vision, hearing changes or sore throat. Has left ear pain. Pulm: no dyspnea, coughing, wheezing CV: no chest pain, no palpitations Abd: has nausea, vomiting, no abdominal pain, diarrhea,  constipation GU: no dysuria, burning on urination, increased urinary frequency, hematuria  Ext: mild leg edema Neuro: has left arm weakness. no vision change or hearing loss Skin: no rash MSK: No muscle spasm, no deformity, no limitation of range of movement in spin Heme: No easy bruising.  Travel history: No recent long distant travel.  Allergy:  Allergies  Allergen Reactions  . Adhesive [Tape] Other (See Comments)    Can burn skin if left on too long  . Celebrex [Celecoxib] Rash  . Detrol [Tolterodine] Hives    Past Medical History:  Diagnosis Date  . Allergy   . Anxiety   . Aortic stenosis   . CAD (coronary artery disease)   . CHF (congestive heart failure) ([place])   . Depression   . Diabetes ([place]) dx'd 1982   "went straight to insulin"  . Dyslipidemia   . GERD (gastroesophageal reflux disease)   . Heart murmur   . HTN (hypertension)   . Hypercholesteremia   . Myocardial infarction ([place]) 12/2014  . Obesity   . Pneumonia 11/2014; 12/2014  . Rheumatoid arthritis(714.0)    "hands" (07/01/2015)  . Stroke syndrome [facility]) 1995; 2001   "when my son was born; problems w/speech and L hand since then" (01/19/2015)  . Thrombophlebitis     Past Surgical History:  Procedure Laterality Date  . AORTIC VALVE REPLACEMENT (AVR)/CORONARY ARTERY BYPASS GRAFTING (CABG)  "2014"   Archie Endo 03/08/2014  . CARDIAC CATHETERIZATION  12/08/2014  . CATARACT EXTRACTION Left   . CESAREAN SECTION  1995  . CORONARY ANGIOPLASTY WITH STENT PLACEMENT  12/09/2014  . CORONARY ARTERY BYPASS GRAFT    .  FOOT FRACTURE SURGERY    . FRACTURE SURGERY    . KNEE ARTHROSCOPY Right   . LEFT HEART CATHETERIZATION WITH CORONARY ANGIOGRAM N/A 12/08/2014   Procedure: LEFT HEART CATHETERIZATION WITH CORONARY ANGIOGRAM;  Surgeon: Wellington Hampshire, MD;  Location: [facility] CATH LAB;  Service: Cardiovascular;  Laterality: N/A;  . NEUROPLASTY / TRANSPOSITION MEDIAN NERVE AT CARPAL TUNNEL    . ORIF ANKLE FRACTURE Left 07/04/2015    Procedure: OPEN REDUCTION INTERNAL FIXATION (ORIF)  TRIMAL ANKLE FRACTURE;  Surgeon: Leandrew Koyanagi, MD;  Location: [place];  Service: Orthopedics;  Laterality: Left;  . PERCUTANEOUS CORONARY STENT INTERVENTION (PCI-S) N/A 12/09/2014   Procedure: PERCUTANEOUS CORONARY STENT INTERVENTION (PCI-S);  Surgeon: Leonie Man, MD;  Location: [facility] C[facility] LAB;  Service: Cardiovascular;  Laterality: N/A;  . TUBAL LIGATION  1995    Social History:  reports that she has never smoked. She has never used smokeless tobacco. She reports that she does not drink alcohol or use drugs.  Family History:  Family History  Problem Relation Age of Onset  . Heart disease Mother   . Heart disease Sister   . Stroke       Prior to Admission medications   Medication Sig Start Date End Date Taking? Authorizing Provider  Blood Glucose Monitoring Suppl ([place]) w/Device KIT Use to check glucose 2x per day 03/16/16  Yes Dorothyann Peng, NP  citalopram (CELEXA) 40 MG tablet Take 1 tablet (40 mg total) by mouth daily. 03/08/16  Yes Dorothyann Peng, NP  furosemide (LASIX) 20 MG tablet Take 1 tablet (20 mg total) by mouth daily. 05/22/16  Yes Domenic Polite, MD  glucose blood test strip Use as instructed Patient taking differently: 1 each by Other route as needed (as instructed). Use as instructed 03/16/16  Yes Dorothyann Peng, NP  insulin aspart (NOVOLOG FLEXPEN) 100 UNIT/ML FlexPen Inject 7 Units into the skin 3 (three) times daily with meals. Patient taking differently: Inject 5 Units into the skin 3 (three) times daily with meals.  06/08/16  Yes Verlee Monte, MD  insulin aspart (NOVOLOG) 100 UNIT/ML injection Inject 2-10 Units into the skin 3 (three) times daily between meals as needed for high blood sugar (sliding scale insulin). 121-150=2 units, 151-200=3 units, 201-250=5 units, 251-300=6 units, 301-350=7 units, 351-400=10 units, >400 units call MD   Yes Historical Provider, MD  insulin detemir (LEVEMIR) 100 UNIT/ML  injection Inject 0.09 mLs (9 Units total) into the skin 2 (two) times daily. 06/08/16  Yes Verlee Monte, MD  Nutritional Supplements (NUTRITIONAL DRINK PO) Take 120 mLs by mouth 3 (three) times daily. Sugar Free [employer] Med Pass 2.0   Yes Historical Provider, MD  omeprazole (PRILOSEC) 40 MG capsule TAKE ONE CAPSULE BY MOUTH EVERY DAY 03/20/16  Yes Dorothyann Peng, NP  Vitamin D, Ergocalciferol, (DRISDOL) 50000 units CAPS capsule Take 50,000 Units by mouth every 7 (seven) days.   Yes Historical Provider, MD  warfarin (COUMADIN) 7.5 MG tablet Take 7.5 mg by mouth daily.   Yes Historical Provider, MD  enoxaparin (LOVENOX) 60 MG/0.6ML injection Inject 0.5 mLs (50 mg total) into the skin every 12 (twelve) hours. Patient not taking: Reported on 07/01/2016 06/08/16   Verlee Monte, MD  feeding supplement, GLUCERNA SHAKE, (GLUCERNA SHAKE) LIQD Take 237 mLs by mouth 3 (three) times daily between meals. Patient not taking: Reported on 07/01/2016 06/04/16   Nishant Dhungel, MD  pravastatin (PRAVACHOL) 20 MG tablet TAKE 1 TABLET BY MOUTH EVERY DAY Patient not taking: Reported on  07/01/2016 06/12/16   Dorothyann Peng, NP    Physical Exam: Vitals:   07/01/16 0000 07/01/16 0002  BP:  129/61  Pulse:  110  Resp:  18  Temp:  97.3 F (36.3 C)  TempSrc:  Oral  SpO2:  100%  Weight: 56.7 kg (125 lb) 52.6 kg (116 lb)  Height: '5\' 3"'  (1.6 m) '5\' 3"'  (1.6 m)   General: Not in acute distress HEENT:       Eyes: PERRL, EOMI, no scleral icterus.       ENT: pt has left ear tenderness, could not be thoroughly exammed due to severe pain. No discharge from the ears and nose, no pharynx injection, no tonsillar enlargement.        Neck: No JVD, no bruit, no mass felt. Heme: No neck lymph node enlargement. Cardiac: S1/S2, RRR, 2/6 murmurs with mechanical click sound. No gallops or rubs. Pulm: No rales, wheezing, rhonchi or rubs. Abd: Soft, nondistended, nontender, no rebound pain, no organomegaly, BS present. GU: No hematuria Ext:  has trace leg edema bilaterally. 2+DP/PT pulse bilaterally. Musculoskeletal: No joint deformities, No joint redness or warmth, no limitation of ROM in spin. Skin: No rashes.  Neuro: Alert, oriented X3, cranial nerves II-XII grossly intact, has left arm weakness. Psych: Patient is not psychotic, no suicidal or hemocidal ideation.  Labs on Admission: I have personally reviewed following labs and imaging studies  CBC:  Recent Labs Lab 07/01/16 0030  WBC 10.9*  NEUTROABS 9.7*  HGB 10.9*  HCT 34.0*  MCV 87.0  PLT 202   Basic Metabolic Panel:  Recent Labs Lab 07/01/16 0030  NA 127*  K 5.3*  CL 90*  CO2 15*  GLUCOSE 910*  BUN 36*  CREATININE 1.53*  CALCIUM 9.5   GFR: Estimated Creatinine Clearance: 34.4 mL/min (by C-G formula based on SCr of 1.53 mg/dL). Liver Function Tests:  Recent Labs Lab 07/01/16 0030  AST 23  ALT 19  ALKPHOS 105  BILITOT 1.4*  PROT 8.2*  ALBUMIN 4.3   No results for input(s): LIPASE, AMYLASE in the last 168 hours. No results for input(s): AMMONIA in the last 168 hours. Coagulation Profile: No results for input(s): INR, PROTIME in the last 168 hours. Cardiac Enzymes: No results for input(s): CKTOTAL, CKMB, CKMBINDEX, TROPONINI in the last 168 hours. BNP (last 3 results) No results for input(s): PROBNP in the last 8760 hours. HbA1C: No results for input(s): HGBA1C in the last 72 hours. CBG:  Recent Labs Lab 07/01/16 0043 07/01/16 0243  GLUCAP >600* 557*   Lipid Profile: No results for input(s): CHOL, HDL, LDLCALC, TRIG, CHOLHDL, LDLDIRECT in the last 72 hours. Thyroid Function Tests: No results for input(s): TSH, T4TOTAL, FREET4, T3FREE, THYROIDAB in the last 72 hours. Anemia Panel: No results for input(s): VITAMINB12, FOLATE, FERRITIN, TIBC, IRON, RETICCTPCT in the last 72 hours. Urine analysis:    Component Value Date/Time   COLORURINE YELLOW 07/01/2016 0022   APPEARANCEUR CLEAR 07/01/2016 0022   LABSPEC 1.027 07/01/2016  0022   PHURINE 5.0 07/01/2016 0022   GLUCOSEU >1000 (A) 07/01/2016 0022   HGBUR NEGATIVE 07/01/2016 0022   BILIRUBINUR NEGATIVE 07/01/2016 0022   BILIRUBINUR n 06/16/2015 1244   KETONESUR >80 (A) 07/01/2016 0022   PROTEINUR NEGATIVE 07/01/2016 0022   UROBILINOGEN 0.2 07/30/2015 1415   NITRITE NEGATIVE 07/01/2016 0022   LEUKOCYTESUR NEGATIVE 07/01/2016 0022   Sepsis Labs: '@LABRCNTIP' (procalcitonin:4,lacticidven:4) )No results found for this or any previous visit (from the past 240 hour(s)).   Radiological Exams on  Admission: No results found.   EKG: Not done in ED, will get one.   Assessment/Plan Principal Problem:   DKA, type 1 (HCC) Active Problems:   CAD (coronary artery disease)   Long term (current) use of anticoagulants   CVA (cerebral infarction)   Depression   Diabetes type 1, uncontrolled (HCC)   S/P aortic valve replacement   Essential hypertension   Hyperkalemia   Chronic diastolic heart failure (HCC)   AKI (acute kidney injury) ([place])   Left ear pain   DKA, type 1 ([place]): Patient has blood sugar 910 with anion gap 22. PH is 7.353. Patient has mild leukocytosis with WBC 10.9, which may be due to possible left ear infection given his severe left ear pain. Currently hemodynamically stable. Clinically nonseptic.  - Admit to stepdown as inpt - Received 2L of NS bolus in ED - start DKA protocol with BMP q4h - IVF: NS 100 cc/h; will switch to D5-1/2NS when CBG<250 - replete K as needed - Zofran prn nausea  - NPO   Left ear pain:  pt has left ear tenderness, could not be thoroughly exammed due to severe pain. No ear drainage. Patient does have fever or chills. She has mild leukocytosis, indicating possible left ear infection. - empiric Cortisporin ear drops - blood culture x 2 if develops fever -prn tylenol for pain  CAD: s/p of Stent and CABG. No CP -Continue pravastatin -Patient is on Coumadin for hx of s/p of AVR  Hx of stroke x 5: has left arm weakness.  No acute new issues. -on pravastatin -on Coumadin as above  DM-I: Last A1c 8.6 on 06/01/16, poorly controled. Patient is taking Levemir and NovoLog at home. Now has DAK -On DAK protocol  S/P aortic valve replacement: on coumadin -check INR -continue Coumadin per pharmacy  HTN: Blood pressure 129/61 -Hold Lasix since patient needs IV fluid due to DKA -IV hydralazine when necessary  Mild Hyperkalemia: K=5.3 due to DAK.  -expect correction with IVF and insulin gtt  Chronic diastolic heart failure ([place]): 2-D echo on 05/02/16 showed EF 60-65 percent with crit 1 diastolic dysfunction. Patient is taking Lasix 20 mg daily. She does not have JVD. She has a trace amount of leg edema. Set up with compensated. -Hold Lasix -Check BNP  AKI (acute kidney injury) [facility]): Cre 1.53, BUN 36. Likely due to prerenal secondary to dehydration and continuation of diruetics - IVF as above - Check FeUrea - Follow up renal function by BMP  Depression: Stable, no suicidal or homicidal ideations. -Continue home medications: Celexa  GERD: -Protonix  DVT ppx: on Coumadin Code Status: Full code Family Communication: None at bed side.  Disposition Plan:  Anticipate discharge back to previous SNF environment Consults called:  none Admission status: SDU/inpation       Date of Service 07/01/2016    Ivor Costa [facility] Pager [phone]  If 7PM-7AM, please contact night-coverage [email or web address] Password [facility] 07/01/2016, 3:38 AM

## 2016-07-01 NOTE — ED Provider Notes (Signed)
[facility] DEPT Provider Note   CSN: [id number] Arrival date & time: 06/30/16  2357  First Provider Contact:  First MD Initiated Contact with Patient 07/01/16 0049    By signing my name below, I, Dora Sims, attest that this documentation has been prepared under the direction and in the presence of Charlann Lange, PA-C. Electronically Signed: Dora Sims, Scribe. 07/01/2016. 12:19 AM.   History   Chief Complaint Chief Complaint  Patient presents with  . Hyperglycemia    The history is provided by the patient. No language interpreter was used.     HPI Comments: Madison [name] is a 55 y.o. female brought in by EMS, with extensive PMHx, including diabetes and hyperglycemia, who presents to the Emergency Department complaining of sudden onset, constant, nausea and vomiting beginning several hours ago. Pt reports her blood sugar as been elevated recently and is generally difficult to control; she states she has been taking her medications as instructed. Pt reports she has experienced similar symptoms due to hyperglycemia in the past. Pt reports that she felt normal two days ago. She notes a h/o stroke x5 and reports minimal use of her left arm; she states her left lower extremity is fine. She states she is ambulatory. She lives at [facility]. She notes she is very thirsty at the moment. Pt denies CP, SOB, abdominal pain, diarrhea, fever, frequency, syncope, or any other associated symptoms.   Past Medical History:  Diagnosis Date  . Allergy   . Anxiety   . Aortic stenosis   . CAD (coronary artery disease)   . CHF (congestive heart failure) ([place])   . Depression   . Diabetes ([place]) dx'd 1982   "went straight to insulin"  . Dyslipidemia   . GERD (gastroesophageal reflux disease)   . Heart murmur   . HTN (hypertension)   . Hypercholesteremia   . Myocardial infarction ([place]) 12/2014  . Obesity   . Pneumonia 11/2014; 12/2014  . Rheumatoid arthritis(714.0)     "hands" (07/01/2015)  . Stroke syndrome [facility]) 1995; 2001   "when my son was born; problems w/speech and L hand since then" (01/19/2015)  . Thrombophlebitis     Patient Active Problem List   Diagnosis Date Noted  . Hyperglycemia 06/06/2016  . Malnutrition of moderate degree 06/04/2016  . Septic shock ([place]) 06/04/2016  . SIRS (systemic inflammatory response syndrome) ([place]) 05/25/2016  . Type 1 diabetes mellitus with ketoacidosis without coma ([place])   . MDD (major depressive disorder), recurrent episode, moderate ([place])   . Aspiration pneumonia ([place]) 05/17/2016  . Diabetic ketoacidosis without coma ([place])   . Sepsis ([place])   . 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 MSOF (multiple systems organ failure) 05/01/2016  . Acute hypoxemic respiratory failure ([place])   . Type I diabetes mellitus with complication, uncontrolled ([place]) 03/29/2016  . Adjustment disorder with mixed anxiety and depressed mood 03/28/2016  . HLD (hyperlipidemia) 03/27/2016  . AKI (acute kidney injury) ([place]) 03/27/2016  . Chronic combined systolic and diastolic congestive heart failure ([place])   . Diabetic ketoacidosis ([place]) 03/26/2016  . Type 1 diabetes, HbA1c goal < 7% (HCC)   . Hypotension 03/08/2016  . Uncontrolled type 1 diabetes mellitus with ketoacidotic coma ([place])   . Acute encephalopathy 02/28/2016  . Hyponatremia: Pseudo 02/28/2016  . Lactic acidosis   . Diabetic ketoacidosis with coma associated with type 1 diabetes mellitus ([place])   . Hypoglycemia   . Chronic diastolic heart failure ([place])   . Esophageal reflux   .  S/P AVR (aortic valve replacement)   . ARF (acute renal failure) ([place]) 01/22/2016  . CHF (congestive heart failure) ([place]) 01/22/2016  . Diabetic ketoacidosis without coma associated with type 1 diabetes mellitus ([place])   . DKA, type 1 ([place]) 11/23/2015  . Constipation 11/23/2015  . Leukocytosis   . Pressure ulcer 08/02/2015  . Diabetic ketoacidosis with coma associated with other specified  diabetes mellitus ([place])   . Closed left ankle fracture 07/01/2015  . Trimalleolar fracture of left ankle 07/01/2015  . Hyperkalemia   . Insomnia 06/16/2015  . Normocytic anemia 01/31/2015  . Acute on chronic combined systolic and diastolic CHF (congestive heart failure) ([place])   . Essential hypertension   . Elevated troponin   . Diabetes type 1, uncontrolled ([place])   . Demand ischemia ([place])   . S/P aortic valve replacement   . Hypokalemia   . NSTEMI (non-ST elevated myocardial infarction) ([place]) 12/08/2014  . Blood poisoning ([place])   . Supratherapeutic INR   . Severe sepsis with acute organ dysfunction ([place]) 12/03/2014  . Spastic hemiplegia affecting nondominant side ([place]) 11/29/2014  . Dysphagia, pharyngoesophageal phase 10/04/2014  . Sinus tachycardia ([place]) 09/30/2014  . Depression 04/29/2014  . CVA (cerebral infarction) 03/12/2014  . Acute respiratory failure ([place]) 03/12/2014  . Chronic anticoagulation 07/28/2013  . Long term (current) use of anticoagulants 07/28/2013  . CAD (coronary artery disease)   . Aortic stenosis   . Hyperlipidemia   . Rheumatoid arthritis [facility])     Past Surgical History:  Procedure Laterality Date  . AORTIC VALVE REPLACEMENT (AVR)/CORONARY ARTERY BYPASS GRAFTING (CABG)  "2014"   Archie Endo 03/08/2014  . CARDIAC CATHETERIZATION  12/08/2014  . CATARACT EXTRACTION Left   . CESAREAN SECTION  1995  . CORONARY ANGIOPLASTY WITH STENT PLACEMENT  12/09/2014  . CORONARY ARTERY BYPASS GRAFT    . FOOT FRACTURE SURGERY    . FRACTURE SURGERY    . KNEE ARTHROSCOPY Right   . LEFT HEART CATHETERIZATION WITH CORONARY ANGIOGRAM N/A 12/08/2014   Procedure: LEFT HEART CATHETERIZATION WITH CORONARY ANGIOGRAM;  Surgeon: Wellington Hampshire, MD;  Location: [place] CATH LAB;  Service: Cardiovascular;  Laterality: N/A;  . NEUROPLASTY / TRANSPOSITION MEDIAN NERVE AT CARPAL TUNNEL    . ORIF ANKLE FRACTURE Left 07/04/2015   Procedure: OPEN REDUCTION INTERNAL FIXATION (ORIF)  TRIMAL ANKLE  FRACTURE;  Surgeon: Leandrew Koyanagi, MD;  Location: [facility];  Service: Orthopedics;  Laterality: Left;  . PERCUTANEOUS CORONARY STENT INTERVENTION (PCI-S) N/A 12/09/2014   Procedure: PERCUTANEOUS CORONARY STENT INTERVENTION (PCI-S);  Surgeon: Leonie Man, MD;  Location[facility] LAB;  Service: Cardiovascular;  Laterality: N/A;  . TUBAL LIGATION  1995    OB History    No data available       Home Medications    Prior to Admission medications   Medication Sig Start Date End Date Taking? Authorizing Provider  Blood Glucose Monitoring Suppl ([place]) w/Device KIT Use to check glucose 2x per Madison 03/16/16   Dorothyann Peng, NP  citalopram (CELEXA) 40 MG tablet Take 1 tablet (40 mg total) by mouth daily. 03/08/16   Dorothyann Peng, NP  enoxaparin (LOVENOX) 60 MG/0.6ML injection Inject 0.5 mLs (50 mg total) into the skin every 12 (twelve) hours. 06/08/16   Verlee Monte, MD  feeding supplement, GLUCERNA SHAKE, (GLUCERNA SHAKE) LIQD Take 237 mLs by mouth 3 (three) times daily between meals. 06/04/16   Nishant Dhungel, MD  furosemide (LASIX) 20 MG tablet Take 1 tablet (20 mg  total) by mouth daily. 05/22/16   Domenic Polite, MD  glucose blood test strip Use as instructed Patient taking differently: 1 each by Other route as needed (as instructed). Use as instructed 03/16/16   Dorothyann Peng, NP  insulin aspart (NOVOLOG FLEXPEN) 100 UNIT/ML FlexPen Inject 7 Units into the skin 3 (three) times daily with meals. 06/08/16   Verlee Monte, MD  insulin detemir (LEVEMIR) 100 UNIT/ML injection Inject 0.09 mLs (9 Units total) into the skin 2 (two) times daily. 06/08/16   Verlee Monte, MD  omeprazole (PRILOSEC) 40 MG capsule TAKE ONE CAPSULE BY MOUTH EVERY Madison Patient not taking: Reported on 06/05/2016 03/20/16   Dorothyann Peng, NP  pravastatin (PRAVACHOL) 20 MG tablet TAKE 1 TABLET BY MOUTH EVERY Madison 06/12/16   Dorothyann Peng, NP  Vitamin D, Ergocalciferol, (DRISDOL) 50000 units CAPS capsule Take 50,000 Units by mouth  every 7 (seven) days.    Historical Provider, MD  warfarin (COUMADIN) 5 MG tablet Take 5 mg by mouth as directed. Instructions: Take 1 tablet daily for 2 Days then Recheck PT/INR on 06/07/16.    Historical Provider, MD    Family History Family History  Problem Relation Age of Onset  . Heart disease Mother   . Heart disease Sister   . Stroke      Social History Social History  Substance Use Topics  . Smoking status: Never Smoker  . Smokeless tobacco: Never Used  . Alcohol use No     Allergies   Adhesive [tape]; Celebrex [celecoxib]; and Detrol [tolterodine]   Review of Systems Review of Systems  Constitutional: Negative for fever.  Respiratory: Negative for shortness of breath.   Cardiovascular: Negative for chest pain.  Gastrointestinal: Positive for nausea and vomiting. Negative for abdominal pain and diarrhea.  Genitourinary: Negative for frequency.  Neurological: Negative for syncope.    Physical Exam Updated Vital Signs BP 129/61 (BP Location: Left Arm)   Pulse 110   Temp 97.3 F (36.3 C) (Oral)   Resp 18   Ht _0  (1.6 m)   Wt 116 lb (52.6 kg)   SpO2 100%   BMI 20.55 kg/m   Physical Exam  Constitutional: She is oriented to person, place, and time. She appears well-developed and well-nourished. No distress.  HENT:  Head: Normocephalic and atraumatic.  Mouth/Throat: Mucous membranes are dry.  Dry mucosa.  Eyes: Conjunctivae and EOM are normal.  Neck: Neck supple. No tracheal deviation present.  Cardiovascular: Normal rate.   Murmur heard. 1-2 systolic murmur.  Pulmonary/Chest: Effort normal. No respiratory distress.  Musculoskeletal: Normal range of motion.  Neurological: She is alert and oriented to person, place, and time.  Skin: Skin is warm and dry.  Psychiatric: She has a normal mood and affect. Her behavior is normal.  Nursing note and vitals reviewed.   ED Treatments / Results  Labs (all labs ordered are listed, but only abnormal results  are displayed) Labs Reviewed - No data to display  EKG  EKG Interpretation None       Radiology No results found.  Procedures Procedures (including critical care time)  DIAGNOSTIC STUDIES: Oxygen Saturation is 100% on RA, normal by my interpretation.    COORDINATION OF CARE: 12:19 AM Discussed treatment plan with pt at bedside and pt agreed to plan.  Medications Ordered in ED Medications - No data to display   Initial Impression / Assessment and Plan / ED Course  I have reviewed the triage vital signs and the nursing notes.  Pertinent  labs & imaging results that were available during my care of the patient were reviewed by me and considered in my medical decision making (see chart for details).  Clinical Course    Patient presents with "high" blood sugar from nursing home residence, nausea and vomiting today. She denies pain, hematemesis.   She is found to be in DKA with Glucose 910, CO2 15, anion gap 22. ABG pending for pH. She appears stable, tolerating PO fluids. Normal mentation.   ABG showing normal pH of 7.353. Hospitalist paged for admission. Glucostabilizer started.    I personally performed the services described in this documentation, which was scribed in my presence. The recorded information has been reviewed and is accurate.    CRITICAL CARE Performed by: Charlann Lange A   Total critical care time: 40 minutes  Critical care time was exclusive of separately billable procedures and treating other patients.  Critical care was necessary to treat or prevent imminent or life-threatening deterioration.  Critical care was time spent personally by me on the following activities: development of treatment plan with patient and/or surrogate as well as nursing, discussions with consultants, evaluation of patient's response to treatment, examination of patient, obtaining history from patient or surrogate, ordering and performing treatments and interventions,  ordering and review of laboratory studies, ordering and review of radiographic studies, pulse oximetry and re-evaluation of patient's condition.   Final Clinical Impressions(s) / ED Diagnoses   Final diagnoses:  None  1. DKA  New Prescriptions New Prescriptions   No medications on file     Charlann Lange, Hershal Coria 07/01/16 0214    Daleen Bo, MD 07/01/16 [phone]

## 2016-07-01 NOTE — ED Notes (Signed)
Critical glucose 910 per Crystal in lab, RN notified.

## 2016-07-01 NOTE — ED Provider Notes (Signed)
  Face-to-face evaluation   History: She presents for evaluation of hyperglycemia, and vomiting.  Physical exam: Alert, calm, cooperative. Abdomen soft, nontender. Mucous membranes somewhat dry. No respiratory distress or increased work of breathing.  Medications  sodium chloride 0.9 % bolus 1,000 mL (1,000 mLs Intravenous New Bag/Given 07/01/16 0029)    Patient Vitals for the past 24 hrs:  BP Temp Temp src Pulse Resp SpO2 Height Weight  07/01/16 0002 129/61 97.3 F (36.3 C) Oral 110 18 100 % 5\' 3"  (1.6 m) 116 lb (52.6 kg)  07/01/16 0000 - - - - - - 5\' 3"  (1.6 m) 125 lb (56.7 kg)      Medical screening examination/treatment/procedure(s) were conducted as a shared visit with non-physician practitioner(s) and myself.  I personally evaluated the patient during the encounter   07/03/16, MD 07/01/16 [phone]

## 2016-07-01 NOTE — Progress Notes (Signed)
ANTICOAGULATION CONSULT NOTE - Initial Consult  Pharmacy Consult for Warfarin Indication: AVR - mechanical valve  Allergies  Allergen Reactions  . Adhesive [Tape] Other (See Comments)    Can burn skin if left on too long  . Celebrex [Celecoxib] Rash  . Detrol [Tolterodine] Hives    Patient Measurements: Height: 5\' 3"  (160 cm) Weight: 116 lb (52.6 kg) IBW/kg (Calculated) : 52.4  Vital Signs: Temp: 97.3 F (36.3 C) (08/13 0002) Temp Source: Oral (08/13 0002) BP: 123/55 (08/13 0330) Pulse Rate: 93 (08/13 0345)  Labs:  Recent Labs  07/01/16 0030 07/01/16 0330  HGB 10.9*  --   HCT 34.0*  --   PLT 338  --   LABPROT  --  28.9*  INR  --  2.66  CREATININE 1.53*  --     Estimated Creatinine Clearance: 34.4 mL/min (by C-G formula based on SCr of 1.53 mg/dL).   Medical History: Past Medical History:  Diagnosis Date  . Allergy   . Anxiety   . Aortic stenosis   . CAD (coronary artery disease)   . CHF (congestive heart failure) (HCC)   . Depression   . Diabetes (HCC) dx'd 1982   "went straight to insulin"  . Dyslipidemia   . GERD (gastroesophageal reflux disease)   . Heart murmur   . HTN (hypertension)   . Hypercholesteremia   . Myocardial infarction (HCC) 12/2014  . Obesity   . Pneumonia 11/2014; 12/2014  . Rheumatoid arthritis(714.0)    "hands" (07/01/2015)  . Stroke syndrome [facility] 1995; 2001   "when my son was born; problems w/speech and L hand since then" (01/19/2015)  . Thrombophlebitis     Assessment:  55 yr female to be admitted for DKA.  PMH significant for DM, HTN, HLD, CAD (s/p stent, CABG), CVA, AVR with mechanical valve.  Patient on warfarin therapy PTA.  Pharmacy consulted to dose warfarin  PTA warfarin 7.5mg  daily - last dose reported on 8/12 @ 18:00  INR upon admission = 2.66  Goal of Therapy:  INR 2.5-3.5   Plan:   Warfarin 7.5mg  po x 1 tonight  Check daily PT/INR  10/12, PharmD 07/01/2016,4:02 AM

## 2016-07-02 ENCOUNTER — Other Ambulatory Visit: Payer: Self-pay | Admitting: *Deleted

## 2016-07-02 DIAGNOSIS — Z7901 Long term (current) use of anticoagulants: Secondary | ICD-10-CM

## 2016-07-02 DIAGNOSIS — F329 Major depressive disorder, single episode, unspecified: Secondary | ICD-10-CM

## 2016-07-02 DIAGNOSIS — I2510 Atherosclerotic heart disease of native coronary artery without angina pectoris: Secondary | ICD-10-CM

## 2016-07-02 DIAGNOSIS — H9202 Otalgia, left ear: Secondary | ICD-10-CM

## 2016-07-02 DIAGNOSIS — I10 Essential (primary) hypertension: Secondary | ICD-10-CM

## 2016-07-02 DIAGNOSIS — E1010 Type 1 diabetes mellitus with ketoacidosis without coma: Principal | ICD-10-CM

## 2016-07-02 DIAGNOSIS — I639 Cerebral infarction, unspecified: Secondary | ICD-10-CM

## 2016-07-02 DIAGNOSIS — N179 Acute kidney failure, unspecified: Secondary | ICD-10-CM

## 2016-07-02 DIAGNOSIS — E875 Hyperkalemia: Secondary | ICD-10-CM

## 2016-07-02 LAB — GLUCOSE, CAPILLARY
GLUCOSE-CAPILLARY: 199 mg/dL — AB (ref 65–99)
GLUCOSE-CAPILLARY: 224 mg/dL — AB (ref 65–99)
GLUCOSE-CAPILLARY: 123 mg/dL — AB (ref 65–99)
Glucose-Capillary: 273 mg/dL — ABNORMAL HIGH (ref 65–99)
Glucose-Capillary: 269 mg/dL — ABNORMAL HIGH (ref 65–99)
Glucose-Capillary: 218 mg/dL — ABNORMAL HIGH (ref 65–99)

## 2016-07-02 LAB — BASIC METABOLIC PANEL
ANION GAP: 6 (ref 5–15)
BUN: 13 mg/dL (ref 6–20)
CALCIUM: 8.9 mg/dL (ref 8.9–10.3)
CO2: 25 mmol/L (ref 22–32)
Chloride: 107 mmol/L (ref 101–111)
Creatinine, Ser: 0.64 mg/dL (ref 0.44–1.00)
GFR calc Af Amer: >60 mL/min (ref >60)
GLUCOSE: 188 mg/dL — AB (ref 65–99)
Potassium: 3.9 mmol/L (ref 3.5–5.1)
SODIUM: 138 mmol/L (ref 135–145)

## 2016-07-02 LAB — UREA NITROGEN, URINE: Urea Nitrogen, Ur: 276 mg/dL

## 2016-07-02 LAB — PROTIME-INR
INR: 4.74 — AB
Prothrombin Time: 45.9 seconds — ABNORMAL HIGH (ref 11.4–15.2)

## 2016-07-02 MED ORDER — ZOLPIDEM TARTRATE 5 MG PO TABS
5.0000 mg | ORAL_TABLET | Freq: Every evening | ORAL | Status: DC | PRN
  Administered 2016-07-02: 5 mg via ORAL
  Filled 2016-07-02: qty 1

## 2016-07-02 NOTE — Consult Note (Signed)
   [facility] CM Inpatient Consult   07/02/2016  Madison [name] Mar 23, 1961 [id number]    Madison [name] is active with [facility] Care Management services. She is from [facility]. [facility] RNCM just had meeting with Madison [name] and staff at [facility] last week regarding longer term plans. The plan at that time was for Madison [name] to eventually transition to an ALF. Madison [name] has not been managing well at home and has limited family support. Please see patient outreach notes from [facility] team. Call made to inpatient RNCM to make aware that [facility] Care Management has been following.  Raiford Noble, MSN-Ed, RN,BSN [facility] Liaison [phone]

## 2016-07-02 NOTE — Progress Notes (Signed)
Gave report to 3W RN

## 2016-07-02 NOTE — Progress Notes (Signed)
Inpatient Diabetes Program Recommendations  AACE/ADA: New Consensus Statement on Inpatient Glycemic Control (2015)  Target Ranges:  Prepandial:   less than 140 mg/dL      Peak postprandial:   less than 180 mg/dL (1-2 hours)      Critically ill patients:  140 - 180 mg/dL   Results for Madison [name], Madison [name] (MRN [id number]) as of 07/02/2016 12:42  Ref. Range 07/02/2016 07:31 07/02/2016 12:00  Glucose-Capillary Latest Ref Range: 65 - 99 mg/dL 865 (H) 784 (H)    Admit with: DKA  History: DM1, CHF, CVA x 5  SNF DM Meds: Levemir 9 units bid    Novolog 2-10 units tid    Novolog 5 units tidwc  Current Insulin Orders: Levemir 9 units bid      Novolog Sensitive Correction Scale/ SSI (0-9 units) TID AC + HS      -Note patient transitioned off IV Insulin drip yesterday.  -Eating 100% of meals per documentation.  -CBG elevated at 12pm today after eating breakfast.     MD- Please consider starting Novolog Meal Coverage for this patient: Novolog 3 units tid with meals to start (hold if pt eats <50% of meal)     --Will follow patient during hospitalization--  Ambrose Finland RN, MSN, CDE Diabetes Coordinator Inpatient Glycemic Control Team Team Pager: [phone] (8a-5p)

## 2016-07-02 NOTE — Patient Outreach (Signed)
[facility]) Care Management  07/02/2016  Madison [name] 1961-04-12 [id number]   CSW received a message from [facility] Liaison with [facility] Care Management, indicating that patient has been readmitted into [facility].  Patient was admitted to [facility] on Sunday, August 13th due to hyperglycemia and vomiting.  Patient was residing at [facility] for short-term rehabilitative services, where patient will return upon discharge from the hospital.  The social worker at [name] reports trying to find an assisted living facility for patient that will allow her to bring her cat, as it has been recommended that patient not return home to live alone, due to very limited support system. CSW will continue to follow patient while hospitalized to assess and assist with possible discharge planning needs and services. Danford Bad, BSW, MSW, LCSW  Licensed Restaurant manager, [employer] [facility]  Mailing [place] [address], [place], [place] [address] Physical Address-[address], [place], [place] [address] Toll Free Main # [phone] Fax # [phone] Cell # [phone]  Fax # [phone]  [name]

## 2016-07-02 NOTE — Progress Notes (Signed)
Nutrition Brief Note  Patient identified on the Malnutrition Screening Tool (MST) Report  Wt Readings from Last 15 Encounters:  07/02/16 124 lb (56.2 kg)  06/08/16 114 lb 13.8 oz (52.1 kg)  06/01/16 114 lb 13.8 oz (52.1 kg)  05/27/16 119 lb 14.4 oz (54.4 kg)  05/24/16 118 lb 14.4 oz (53.9 kg)  05/10/16 132 lb 7.9 oz (60.1 kg)  05/07/16 128 lb (58.1 kg)  04/30/16 120 lb (54.4 kg)  04/24/16 119 lb 14.4 oz (54.4 kg)  04/03/16 131 lb 1.6 oz (59.5 kg)  03/20/16 150 lb 14.4 oz (68.4 kg)  03/13/16 137 lb (62.1 kg)  03/11/16 141 lb 9.6 oz (64.2 kg)  03/08/16 135 lb (61.2 kg)  03/01/16 140 lb 12.8 oz (63.9 kg)    Body mass index is 21.97 kg/m. Patient meets criteria for normal weight based on current BMI. No skin issues noted at this time. Pt with hx of Type 1 DM with poor glycemic control and hx of 5 strokes with L arm weakness, is ambulatory at baseline. Notes indicate pt admitted from [name].   Current diet order is Carb Modified, patient consumed 100% of lunch yesterday and breakfast this AM. Labs reviewed; CBGs: 123 and 224 mg/dL this AM, [id number] on 06/13/35: 8.6%. Medications reviewed; sliding scale Novolog, 9 units Levemir BID, PRN IV Zofran.  No nutrition interventions warranted at this time. If nutrition issues arise, please consult RD. Please consult if diet education desired prior to d/c.    Trenton Gammon, MS, RD, LDN Inpatient Clinical Dietitian Pager # [phone] After hours/weekend pager # [phone]

## 2016-07-02 NOTE — Progress Notes (Signed)
PROGRESS NOTE    Madison [name]  DVV:[id number] DOB: 12-29-1960 DOA: 06/30/2016 PCP: [name], NP   Brief Narrative:  Madison [name] is a 55yo female with a history of type 1 diabetes, hypertension, hyperlipidemia, GERD, depression, CAD, CABG, dCHF, CVA x5, AVR s/p mechanical valve that presented in DKA. She was started on the glucomander and anion gap closed. Home Levemir restarted.   Assessment & Plan:   Principal Problem:   DKA, type 1 (HCC) Active Problems:   CAD (coronary artery disease)   Long term (current) use of anticoagulants   CVA (cerebral infarction)   Depression   Diabetes type 1, uncontrolled (HCC)   S/P aortic valve replacement   Essential hypertension   Hyperkalemia   Chronic diastolic heart failure (HCC)   AKI (acute kidney injury) (HCC)   Left ear pain   DKA Type 1 Diabetes Anion gap closed last night. Levemir 9u BID restarted. Blood pressures improved today. Last A1C 8.6 on 06/01/16 -continue Levemir 9u bid -SSI sensitive  Left ear pain Improved. -continue cortisporin  CAD History of CVA S/p stent and CABG -continue pravastatin  Aortic valve replacement Patient's INR elevated today -continue warfarin per pharmacy  Hypertension Pressure improved  Hyperkalemia Resolved  AKI Resolved  Chronic diastolic heart failure Euvolemic -hold lasix secondary to recent dehydration  Depression Stable -continue Celexa   DVT prophylaxis: Anticoagulated on warfarin Code Status: Full code  Family Communication: None at bedside Disposition Plan: Transfer to floor. Discharge to SNF when INR improved.   Consultants:   Pharmacy  Procedures:   None  Antimicrobials:   None    Subjective: Patient reports no complaints today.  Objective: Vitals:   07/02/16 0400 07/02/16 0417 07/02/16 0600 07/02/16 0700  BP: (!) 105/46  (!) 122/36   Pulse: 67  63   Resp: 15  17   Temp:  98 F (36.7 C)  97.7 F (36.5 C)  TempSrc:  Oral  Oral    SpO2: 93%  95%   Weight:      Height:        Intake/Output Summary (Last 24 hours) at 07/02/16 0937 Last data filed at 07/02/16 0903  Gross per 24 hour  Intake          1390.65 ml  Output             2400 ml  Net         -1009.35 ml   Filed Weights   07/01/16 0000 07/01/16 0002 07/01/16 0400  Weight: 56.7 kg (125 lb) 52.6 kg (116 lb) 54.3 kg (119 lb 11.4 oz)    Examination:  General exam: Appears calm and comfortable  Respiratory system: Clear to auscultation. Respiratory effort normal. Cardiovascular system: S1 & S2 heard, RRR. 2/6 Systolic murmur. No rubs, gallops or clicks. No pedal edema. Gastrointestinal system: Abdomen is nondistended, soft and nontender. No organomegaly or masses felt. Normal bowel sounds heard. Central nervous system: Alert and oriented. No focal neurological deficits. Extremities: 5/5 RUE/bilateral LE strength. 4/5 LUE strength. Left wrist with decreased ROM. Skin: No rashes, lesions or ulcers Psychiatry: Judgement and insight appear normal. Mood & affect appropriate.     Data Reviewed: I have personally reviewed following labs and imaging studies  CBC:  Recent Labs Lab 07/01/16 0030  WBC 10.9*  NEUTROABS 9.7*  HGB 10.9*  HCT 34.0*  MCV 87.0  PLT 338   Basic Metabolic Panel:  Recent Labs Lab 07/01/16 0030 07/01/16 0330 07/01/16 0702 07/01/16 1105 07/02/16  0515  NA 127* 131* 135 134* 138  K 5.3* 3.6 4.0 4.4 3.9  CL 90* 100* 103 103 107  CO2 15* 19* 26 21* 25  GLUCOSE 910* 382* 143* 99 188*  BUN 36* 29* 26* 22* 13  CREATININE 1.53* 1.22* 0.79 0.82 0.64  CALCIUM 9.5 8.4* 8.9 8.9 8.9   GFR: Estimated Creatinine Clearance: 65.7 mL/min (by C-G formula based on SCr of 0.8 mg/dL). Liver Function Tests:  Recent Labs Lab 07/01/16 0030  AST 23  ALT 19  ALKPHOS 105  BILITOT 1.4*  PROT 8.2*  ALBUMIN 4.3    Recent Labs Lab 07/01/16 0330  LIPASE 15   No results for input(s): AMMONIA in the last 168 hours. Coagulation  Profile:  Recent Labs Lab 07/01/16 0330 07/02/16 0515  INR 2.66 4.74*   Cardiac Enzymes: No results for input(s): CKTOTAL, CKMB, CKMBINDEX, TROPONINI in the last 168 hours. BNP (last 3 results) No results for input(s): PROBNP in the last 8760 hours. HbA1C: No results for input(s): HGBA1C in the last 72 hours. CBG:  Recent Labs Lab 07/01/16 1435 07/01/16 1544 07/01/16 1746 07/02/16 0255 07/02/16 0731  GLUCAP 232* 283* 287* 224* 123*   Lipid Profile: No results for input(s): CHOL, HDL, LDLCALC, TRIG, CHOLHDL, LDLDIRECT in the last 72 hours. Thyroid Function Tests: No results for input(s): TSH, T4TOTAL, FREET4, T3FREE, THYROIDAB in the last 72 hours. Anemia Panel: No results for input(s): VITAMINB12, FOLATE, FERRITIN, TIBC, IRON, RETICCTPCT in the last 72 hours. Sepsis Labs: No results for input(s): PROCALCITON, LATICACIDVEN in the last 168 hours.  Recent Results (from the past 240 hour(s))  MRSA PCR Screening     Status: None   Collection Time: 07/01/16  4:17 AM  Result Value Ref Range Status   MRSA by PCR NEGATIVE NEGATIVE Final    Comment:        The GeneXpert MRSA Assay (FDA approved for NASAL specimens only), is one component of a comprehensive MRSA colonization surveillance program. It is not intended to diagnose MRSA infection nor to guide or monitor treatment for MRSA infections.          Radiology Studies: No results found.      Scheduled Meds: . citalopram  40 mg Oral Daily  . insulin aspart  0-5 Units Subcutaneous QHS  . insulin aspart  0-9 Units Subcutaneous TID WC  . insulin detemir  9 Units Subcutaneous BID  . NEOMYCIN-POLYMYXIN-HYDROCORTISONE  3 drop Left Ear Q6H  . pantoprazole  40 mg Oral Daily  . Warfarin - Pharmacist Dosing Inpatient   Does not apply q1800   Continuous Infusions:    LOS: 1 day     Jacquelin Hawking, MD [facility] Pager [phone]  If 7PM-7AM, please contact night-coverage [email or web address] Password  [facility] 07/02/2016, 9:37 AM

## 2016-07-02 NOTE — Clinical Social Work Note (Signed)
Clinical Social Work Assessment  Patient Details  Name: Madison [name] MRN: [id number] Date of Birth: 10/15/1961  Date of referral:  07/02/16               Reason for consult:  Discharge Planning                Permission sought to share information with:  Facility Industrial/product designer granted to share information::  Yes, Verbal Permission Granted  Name::        Agency::     Relationship::     Contact Information:     Housing/Transportation Living arrangements for the past 2 months:  Skilled Nursing Facility Source of Information:  Patient Patient Interpreter Needed:  None Criminal Activity/Legal Involvement Pertinent to Current Situation/Hospitalization:    Significant Relationships:    Lives with:  Facility Resident Do you feel safe going back to the place where you live?  No Need for family participation in patient care:  Yes (Comment)  Care giving concerns:  CSW received consult to speak with patient about their current discharge plans.    Social Worker assessment / plan:  CSW spoke with patient at bedside regarding discharge plans. Patient has been living at [facility] for the past 6 months and would like to return before returning to her apartment. Patient stated that she has her own apartment and is able to return. Patient would like to go back to her apartment in the future but thinks it would be best to return to [facility] short-term.   Employment status:  Disabled (Comment on whether or not currently receiving Disability) Insurance information:  Managed Medicare PT Recommendations:  Not assessed at this time Information / Referral to community resources:  Skilled Nursing Facility  Patient/Family's Response to care:  Patient was appreciative of CSW.   Patient/Family's Understanding of and Emotional Response to Diagnosis, Current Treatment, and Prognosis:  Patient understood the treatment and prognosis. Patient would like to go back to [facility] and  return to her Apartment in the future.   Emotional Assessment Appearance:  Appears stated age Attitude/Demeanor/Rapport:    Affect (typically observed):  Pleasant, Accepting, Calm Orientation:  Oriented to Self, Oriented to Place, Oriented to  Time, Oriented to Situation Alcohol / Substance use:    Psych involvement (Current and /or in the community):  No (Comment)  Discharge Needs  Concerns to be addressed:  No discharge needs identified Readmission within the last 30 days:  No Current discharge risk:  None Barriers to Discharge:  No Barriers Identified   Donnie Coffin, LCSW 07/02/2016, 4:20 PM

## 2016-07-02 NOTE — Progress Notes (Signed)
ANTICOAGULATION CONSULT NOTE - Initial Consult  Pharmacy Consult for Warfarin Indication: AVR - mechanical valve  Allergies  Allergen Reactions  . Adhesive [Tape] Other (See Comments)    Can burn skin if left on too long  . Celebrex [Celecoxib] Rash  . Detrol [Tolterodine] Hives    Patient Measurements: Height: 5\' 3"  (160 cm) Weight: 124 lb (56.2 kg) IBW/kg (Calculated) : 52.4  Vital Signs: Temp: 98.4 F (36.9 C) (08/14 1008) Temp Source: Oral (08/14 1008) BP: 118/55 (08/14 1008) Pulse Rate: 73 (08/14 1008)  Labs:  Recent Labs  07/01/16 0030 07/01/16 0330 07/01/16 0702 07/01/16 1105 07/02/16 0515  HGB 10.9*  --   --   --   --   HCT 34.0*  --   --   --   --   PLT 338  --   --   --   --   LABPROT  --  28.9*  --   --  45.9*  INR  --  2.66  --   --  4.74*  CREATININE 1.53* 1.22* 0.79 0.82 0.64    Estimated Creatinine Clearance: 65.7 mL/min (by C-G formula based on SCr of 0.8 mg/dL).   Medical History: Past Medical History:  Diagnosis Date  . Allergy   . Anxiety   . Aortic stenosis   . CAD (coronary artery disease)   . CHF (congestive heart failure) (HCC)   . Depression   . Diabetes (HCC) dx'd 1982   "went straight to insulin"  . Dyslipidemia   . GERD (gastroesophageal reflux disease)   . Heart murmur   . HTN (hypertension)   . Hypercholesteremia   . Myocardial infarction (HCC) 12/2014  . Obesity   . Pneumonia 11/2014; 12/2014  . Rheumatoid arthritis(714.0)    "hands" (07/01/2015)  . Stroke syndrome [facility]) 1995; 2001   "when my son was born; problems w/speech and L hand since then" (01/19/2015)  . Thrombophlebitis     Assessment: 55 yr female to be admitted for DKA.  PMH significant for DM, HTN, HLD, CAD (s/p stent, CABG), CVA, AVR with mechanical valve.  Patient on warfarin therapy PTA; pharmacy to resume while inpatient.   Baseline INR: 2.66  Prior anticoagulation: warfarin 7.5 mg daily; LD 8/12 PM  Significant events:  Today, 07/02/2016:  CBC:  hgb sl low on 8/13, Plt wnl  INR now supratherapeutic  Major drug interactions  No bleeding issues per nursing  Eating 100% of meals  Goal of Therapy: INR 2.5-3.5  Plan:  Hold warfarin tonight  Daily INR  CBC at least q72 hr while on warfarin  Monitor for signs of bleeding or thrombosis   9/13, PharmD Pager: [phone] 07/02/2016, 11:07 AM

## 2016-07-02 NOTE — Progress Notes (Signed)
CRITICAL VALUE ALERT  Critical value received:  INR 4.74  Date of notification:  07/02/2016  Time of notification:  0624  Critical value read back:Yes.    Nurse who received alert:  Leonia Reeves , RN  MD notified (1st page):  S. Gwynneth Munson, PA  Time of first page:  [phone]  Responding MD:  S.Gwynneth Munson, PA  Time MD responded:  [phone]

## 2016-07-03 ENCOUNTER — Other Ambulatory Visit: Payer: Self-pay | Admitting: *Deleted

## 2016-07-03 DIAGNOSIS — I639 Cerebral infarction, unspecified: Secondary | ICD-10-CM | POA: Diagnosis not present

## 2016-07-03 DIAGNOSIS — E872 Acidosis: Secondary | ICD-10-CM | POA: Diagnosis not present

## 2016-07-03 DIAGNOSIS — G894 Chronic pain syndrome: Secondary | ICD-10-CM | POA: Diagnosis not present

## 2016-07-03 DIAGNOSIS — E1010 Type 1 diabetes mellitus with ketoacidosis without coma: Secondary | ICD-10-CM | POA: Diagnosis not present

## 2016-07-03 DIAGNOSIS — H6090 Unspecified otitis externa, unspecified ear: Secondary | ICD-10-CM | POA: Diagnosis not present

## 2016-07-03 DIAGNOSIS — N179 Acute kidney failure, unspecified: Secondary | ICD-10-CM | POA: Diagnosis not present

## 2016-07-03 DIAGNOSIS — E1169 Type 2 diabetes mellitus with other specified complication: Secondary | ICD-10-CM | POA: Diagnosis not present

## 2016-07-03 DIAGNOSIS — I5042 Chronic combined systolic (congestive) and diastolic (congestive) heart failure: Secondary | ICD-10-CM | POA: Diagnosis not present

## 2016-07-03 DIAGNOSIS — R739 Hyperglycemia, unspecified: Secondary | ICD-10-CM | POA: Diagnosis not present

## 2016-07-03 DIAGNOSIS — Z5181 Encounter for therapeutic drug level monitoring: Secondary | ICD-10-CM | POA: Diagnosis not present

## 2016-07-03 DIAGNOSIS — E784 Other hyperlipidemia: Secondary | ICD-10-CM | POA: Diagnosis not present

## 2016-07-03 DIAGNOSIS — M6281 Muscle weakness (generalized): Secondary | ICD-10-CM | POA: Diagnosis not present

## 2016-07-03 DIAGNOSIS — F4323 Adjustment disorder with mixed anxiety and depressed mood: Secondary | ICD-10-CM | POA: Diagnosis not present

## 2016-07-03 DIAGNOSIS — I2510 Atherosclerotic heart disease of native coronary artery without angina pectoris: Secondary | ICD-10-CM | POA: Diagnosis not present

## 2016-07-03 DIAGNOSIS — Z954 Presence of other heart-valve replacement: Secondary | ICD-10-CM

## 2016-07-03 DIAGNOSIS — M8000XD Age-related osteoporosis with current pathological fracture, unspecified site, subsequent encounter for fracture with routine healing: Secondary | ICD-10-CM | POA: Diagnosis not present

## 2016-07-03 DIAGNOSIS — K219 Gastro-esophageal reflux disease without esophagitis: Secondary | ICD-10-CM | POA: Diagnosis not present

## 2016-07-03 DIAGNOSIS — E108 Type 1 diabetes mellitus with unspecified complications: Secondary | ICD-10-CM | POA: Diagnosis not present

## 2016-07-03 DIAGNOSIS — E785 Hyperlipidemia, unspecified: Secondary | ICD-10-CM | POA: Diagnosis not present

## 2016-07-03 DIAGNOSIS — I5032 Chronic diastolic (congestive) heart failure: Secondary | ICD-10-CM | POA: Diagnosis not present

## 2016-07-03 DIAGNOSIS — E1310 Other specified diabetes mellitus with ketoacidosis without coma: Secondary | ICD-10-CM | POA: Diagnosis not present

## 2016-07-03 DIAGNOSIS — E1069 Type 1 diabetes mellitus with other specified complication: Secondary | ICD-10-CM | POA: Diagnosis not present

## 2016-07-03 DIAGNOSIS — Z952 Presence of prosthetic heart valve: Secondary | ICD-10-CM | POA: Diagnosis not present

## 2016-07-03 DIAGNOSIS — I10 Essential (primary) hypertension: Secondary | ICD-10-CM | POA: Diagnosis not present

## 2016-07-03 DIAGNOSIS — R2689 Other abnormalities of gait and mobility: Secondary | ICD-10-CM | POA: Diagnosis not present

## 2016-07-03 DIAGNOSIS — G4709 Other insomnia: Secondary | ICD-10-CM | POA: Diagnosis not present

## 2016-07-03 DIAGNOSIS — R278 Other lack of coordination: Secondary | ICD-10-CM | POA: Diagnosis not present

## 2016-07-03 LAB — GLUCOSE, CAPILLARY
GLUCOSE-CAPILLARY: 86 mg/dL (ref 65–99)
GLUCOSE-CAPILLARY: 339 mg/dL — AB (ref 65–99)
Glucose-Capillary: 42 mg/dL — CL (ref 65–99)

## 2016-07-03 LAB — PROTIME-INR
INR: 4.62
Prothrombin Time: 44.9 seconds — ABNORMAL HIGH (ref 11.4–15.2)

## 2016-07-03 MED ORDER — INSULIN DETEMIR 100 UNIT/ML ~~LOC~~ SOLN
10.0000 [IU] | Freq: Two times a day (BID) | SUBCUTANEOUS | Status: DC
  Administered 2016-07-03: 8 [IU] via SUBCUTANEOUS
  Filled 2016-07-03: qty 0.1

## 2016-07-03 MED ORDER — INSULIN DETEMIR 100 UNIT/ML ~~LOC~~ SOLN
8.0000 [IU] | Freq: Two times a day (BID) | SUBCUTANEOUS | Status: DC
  Filled 2016-07-03 (×2): qty 0.08

## 2016-07-03 MED ORDER — NEOMYCIN-POLYMYXIN-HC 1 % OT SOLN: 3.0000 [drp] | Freq: Four times a day (QID) | OTIC | 0 refills | Status: AC

## 2016-07-03 MED ORDER — INSULIN DETEMIR 100 UNIT/ML ~~LOC~~ SOLN: 8.0000 [IU] | Freq: Two times a day (BID) | SUBCUTANEOUS | Status: DC

## 2016-07-03 MED ORDER — WARFARIN SODIUM 7.5 MG PO TABS: 7.5000 mg | ORAL_TABLET | Freq: Every day | ORAL | Status: DC

## 2016-07-03 NOTE — Progress Notes (Signed)
CRITICAL VALUE ALERT  Critical value received: INR 4.62  Date of notification:  07/03/16  Time of notification:  0520  Critical value read back:Yes  Nurse who received alert:  Gaston Islam  MD notified (1st page):  N/A (Results consistent with previous lab values. Slightly lower INR today.  MD aware)  Time of first page: N/A  MD notified (2nd page): N/A  Time of second page: N/A  Responding MD:  N/A  Time MD responded: N/A

## 2016-07-03 NOTE — NC FL2 (Signed)
[place] MEDICAID FL2 LEVEL OF CARE SCREENING TOOL     IDENTIFICATION  Patient Name: Madison [name] Birthdate: 03-20-1961 Sex: female Admission Date (Current Location): 06/30/2016  [facility] and [place] Number:  Producer, television/film/video and Address:  [facility],  [address] [place] [address]      Provider Number: [id number]  Attending Physician Name and Address:  Narda Bonds, MD  Relative Name and Phone Number:       Current Level of Care: Hospital Recommended Level of Care: Skilled Nursing Facility Prior Approval Number:    Date Approved/Denied:   PASRR Number:    Discharge Plan: SNF    Current Diagnoses: Patient Active Problem List   Diagnosis Date Noted  . Left ear pain 07/01/2016  . Hyperglycemia 06/06/2016  . Malnutrition of moderate degree 06/04/2016  . Septic shock (HCC) 06/04/2016  . SIRS (systemic inflammatory response syndrome) (HCC) 05/25/2016  . Type 1 diabetes mellitus with ketoacidosis without coma (HCC)   . MDD (major depressive disorder), recurrent episode, moderate (HCC)   . Aspiration pneumonia (HCC) 05/17/2016  . Diabetic ketoacidosis without coma (HCC)   . Sepsis (HCC)   . 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 MSOF (multiple systems organ failure) 05/01/2016  . Acute hypoxemic respiratory failure (HCC)   . Type I diabetes mellitus with complication, uncontrolled (HCC) 03/29/2016  . Adjustment disorder with mixed anxiety and depressed mood 03/28/2016  . HLD (hyperlipidemia) 03/27/2016  . AKI (acute kidney injury) (HCC) 03/27/2016  . Chronic combined systolic and diastolic congestive heart failure (HCC)   . Diabetic ketoacidosis (HCC) 03/26/2016  . Type 1 diabetes, HbA1c goal < 7% (HCC)   . Hypotension 03/08/2016  . Uncontrolled type 1 diabetes mellitus with ketoacidotic coma (HCC)   . Acute encephalopathy 02/28/2016  . Hyponatremia: Pseudo 02/28/2016  . Lactic acidosis   . Diabetic ketoacidosis with coma associated  with type 1 diabetes mellitus (HCC)   . Hypoglycemia   . Chronic diastolic heart failure (HCC)   . Esophageal reflux   . S/P AVR (aortic valve replacement)   . ARF (acute renal failure) (HCC) 01/22/2016  . CHF (congestive heart failure) (HCC) 01/22/2016  . Diabetic ketoacidosis without coma associated with type 1 diabetes mellitus (HCC)   . DKA, type 1 (HCC) 11/23/2015  . Constipation 11/23/2015  . Leukocytosis   . Pressure ulcer 08/02/2015  . Diabetic ketoacidosis with coma associated with other specified diabetes mellitus (HCC)   . Closed left ankle fracture 07/01/2015  . Trimalleolar fracture of left ankle 07/01/2015  . Hyperkalemia   . Insomnia 06/16/2015  . Normocytic anemia 01/31/2015  . Acute on chronic combined systolic and diastolic CHF (congestive heart failure) (HCC)   . Essential hypertension   . Elevated troponin   . Diabetes type 1, uncontrolled (HCC)   . Demand ischemia (HCC)   . S/P aortic valve replacement   . Hypokalemia   . NSTEMI (non-ST elevated myocardial infarction) (HCC) 12/08/2014  . Blood poisoning (HCC)   . Supratherapeutic INR   . Severe sepsis with acute organ dysfunction (HCC) 12/03/2014  . Spastic hemiplegia affecting nondominant side (HCC) 11/29/2014  . Dysphagia, pharyngoesophageal phase 10/04/2014  . Sinus tachycardia (HCC) 09/30/2014  . Depression 04/29/2014  . CVA (cerebral infarction) 03/12/2014  . Acute respiratory failure (HCC) 03/12/2014  . Chronic anticoagulation 07/28/2013  . Long term (current) use of anticoagulants 07/28/2013  . CAD (coronary artery disease)   . Aortic stenosis   . Hyperlipidemia   .  Rheumatoid arthritis (HCC)     Orientation RESPIRATION BLADDER Height & Weight     Self, Time, Situation, Place  Normal Continent Weight: 124 lb (56.2 kg) Height:  5\' 3"  (160 cm)  BEHAVIORAL SYMPTOMS/MOOD NEUROLOGICAL BOWEL NUTRITION STATUS  Other (Comment) (no behaviors)   Continent Diet  AMBULATORY STATUS COMMUNICATION OF  NEEDS Skin   Limited Assist Verbally Normal                       Personal Care Assistance Level of Assistance  Bathing, Feeding, Dressing Bathing Assistance: Limited assistance Feeding assistance: Independent Dressing Assistance: Limited assistance     Functional Limitations Info  Sight, Hearing, Speech Sight Info: Adequate Hearing Info: Adequate Speech Info: Adequate    SPECIAL CARE FACTORS FREQUENCY  PT (By licensed PT), OT (By licensed OT)     PT Frequency: 5 x wk OT Frequency: 5 x wk            Contractures Contractures Info: Not present    Additional Factors Info  Insulin Sliding Scale, Psychotropic, Code Status Code Status Info: full code             Current Medications (07/03/2016):  This is the current hospital active medication list Current Facility-Administered Medications  Medication Dose Route Frequency Provider Last Rate Last Dose  . acetaminophen (TYLENOL) tablet 650 mg  650 mg Oral Q6H PRN 07/05/2016, MD      . citalopram (CELEXA) tablet 40 mg  40 mg Oral Daily Lorretta Harp, MD   40 mg at 07/02/16 0944  . hydrALAZINE (APRESOLINE) injection 5 mg  5 mg Intravenous Q2H PRN 07/04/16, MD      . insulin aspart (novoLOG) injection 0-5 Units  0-5 Units Subcutaneous QHS Lorretta Harp, MD   2 Units at 07/02/16 2239  . insulin aspart (novoLOG) injection 0-9 Units  0-9 Units Subcutaneous TID WC 2240, MD   5 Units at 07/02/16 1702  . insulin detemir (LEVEMIR) injection 8 Units  8 Units Subcutaneous BID 07/04/16, MD      . NEOMYCIN-POLYMYXIN-HYDROCORTISONE (CORTISPORIN) otic solution 3 drop  3 drop Left Ear Q6H Narda Bonds, MD   3 drop at 07/03/16 0600  . ondansetron (ZOFRAN) injection 4 mg  4 mg Intravenous Q8H PRN 07/05/16, MD      . pantoprazole (PROTONIX) EC tablet 40 mg  40 mg Oral Daily Lorretta Harp, MD   40 mg at 07/02/16 0944  . Warfarin - Pharmacist Dosing Inpatient   Does not apply q1800 Leann T Poindexter, RPH      . zolpidem (AMBIEN)  tablet 5 mg  5 mg Oral QHS PRN 07/04/16, NP   5 mg at 07/02/16 2308     Discharge Medications: Please see discharge summary for a list of discharge medications.  Relevant Imaging Results:  Relevant Lab Results:   Additional Information SS#[id number]  Elvan Ebron, 04-22-1982, LCSW

## 2016-07-03 NOTE — Consult Note (Signed)
   [facility] CM Inpatient Consult   07/03/2016  Madison [name] 12/09/1960 [id number]   [name] was discussed this morning in a [facility] Care Management multi-disciplinary team meeting. It was concluded that [name] will be discharged from [facility] Care Management program due to continued non-adherence of plan of care and recommendations of [facility] Care Management team. Spoke with [name] to make her aware that [facility] Care Management is discharging her from the program. Discussed that if and when she chooses to adhere and accept recommendations she is welcome to contact [facility] Care Management. Contact information provided. [name] expresses understanding of this. [name] states she does not plan on staying for long term care because "it is too expensive". She also endorses that she will only go to ALF if they find a place that will accept her cat as well. Reiterated to her that is strongly recommended that long term placement and/or ALF is the best long term option for her due to her health and frequency of hospital admissions. She does however report she does plan on returning to SNF post hospital discharge. [facility] Community team aware of discharge from the program. Also made inpatient RNCM aware that [name] is discharged from [facility] Care Management program.    Raiford Noble, MSN-Ed, RN,BSN [facility] Liaison [phone]

## 2016-07-03 NOTE — Progress Notes (Signed)
Pt is ready to return to [name] today. [employer] medicare has provided authorization # [id number]. [facility] transport required. Medical necessity form completed. Pt is aware out of pocket costs may be associated with [facility] transport. D/C Summary sent to SNF for review. No Scripts printed. # for report provided to nsg. Pt declined CSW assistance contacting family. Pt will provide dc update to family.  Cori Razor LCSW [phone]

## 2016-07-03 NOTE — Evaluation (Signed)
Physical Therapy Evaluation Patient Details Name: Madison [name] MRN: [id number] DOB: November 01, 1961 Today's Date: 07/03/2016   History of Present Illness  Madison [name] is a 55yo female with a history of type 1 diabetes, hypertension, hyperlipidemia, GERD, depression, CAD, CABG, dCHF, CVA x5, AVR s/p mechanical valve that presented in DKA.  Clinical Impression  Pt admitted with above diagnosis. Pt currently with functional limitations due to the deficits listed below (see PT Problem List). Pt scored a 10 on Dynamic Gait Index indicating high fall risk. Pt also needed A in bathroom with clothing management and getting toilet paper.  Recommend return to SNF.  Pt will benefit from skilled PT to increase their independence and safety with mobility to allow discharge to the venue listed below.       Follow Up Recommendations SNF;Supervision for mobility/OOB    Equipment Recommendations  None recommended by PT    Recommendations for Other Services       Precautions / Restrictions Precautions Precautions: Fall Restrictions Weight Bearing Restrictions: No      Mobility  Bed Mobility Overal bed mobility: Independent                Transfers Overall transfer level: Needs assistance   Transfers: Sit to/from Stand Sit to Stand: Supervision         General transfer comment: Assisted pt to bathroom prior to gait.  She needed A with clothing management and getting toilet paper.  Ambulation/Gait Ambulation/Gait assistance: Min guard Ambulation Distance (Feet): 350 Feet Assistive device: None Gait Pattern/deviations: Decreased step length - left;Decreased step length - right;Drifts right/left Gait velocity: decreased   General Gait Details: Pt with slow gait pattern with decreased step length.  At times she tends to weight shift too far anteriorly, but no LOB.  See dynamic gait index score.  Stairs            Wheelchair Mobility    Modified Rankin (Stroke Patients  Only)       Balance                                 Standardized Balance Assessment Standardized Balance Assessment : Dynamic Gait Index   Dynamic Gait Index Level Surface: Moderate Impairment Change in Gait Speed: Severe Impairment Gait with Horizontal Head Turns: Mild Impairment Gait with Vertical Head Turns: Moderate Impairment Gait and Pivot Turn: Mild Impairment Step Over Obstacle: Moderate Impairment Step Around Obstacles: Mild Impairment Steps: Moderate Impairment Total Score: 10       Pertinent Vitals/Pain Pain Assessment: No/denies pain    Home Living Family/patient expects to be discharged to:: Skilled nursing facility                 Additional Comments: Has been receiving rehab at [facility]. Pt reports numerous hospital admissions.    Prior Function Level of Independence: Independent               Hand Dominance   Dominant Hand: Right    Extremity/Trunk Assessment   Upper Extremity Assessment: LUE deficits/detail       LUE Deficits / Details: L flexion synergy pattern from old CVA   Lower Extremity Assessment: Generalized weakness;Overall WFL for tasks assessed         Communication   Communication: Expressive difficulties  Cognition Arousal/Alertness: Awake/alert Behavior During Therapy: WFL for tasks assessed/performed Overall Cognitive Status: Within Functional Limits for tasks assessed  General Comments      Exercises        Assessment/Plan    PT Assessment Patient needs continued PT services  PT Diagnosis Abnormality of gait;Generalized weakness   PT Problem List Decreased balance;Decreased mobility;Decreased strength;Impaired tone  PT Treatment Interventions Gait training;Functional mobility training;Balance training;Therapeutic exercise;Therapeutic activities;Patient/family education   PT Goals (Current goals can be found in the Care Plan section) Acute Rehab PT Goals Patient  Stated Goal: Return to SNF for continued rehab then hopes to go to ALF. PT Goal Formulation: With patient Time For Goal Achievement: 07/10/16 Potential to Achieve Goals: Good    Frequency Min 3X/week   Barriers to discharge Decreased caregiver support      Co-evaluation               End of Session Equipment Utilized During Treatment: Gait belt Activity Tolerance: Patient tolerated treatment well Patient left: in chair;with call bell/phone within reach;with chair alarm set           Time: 1033-1100 PT Time Calculation (min) (ACUTE ONLY): 27 min   Charges:   PT Evaluation $PT Eval Moderate Complexity: 1 Procedure PT Treatments $Gait Training: 8-22 mins   PT G Codes:        Ceanna Wareing LUBECK 07/03/2016, 11:10 AM

## 2016-07-03 NOTE — Progress Notes (Signed)
ANTICOAGULATION CONSULT NOTE - Initial Consult  Pharmacy Consult for Warfarin Indication: AVR - mechanical valve  Allergies  Allergen Reactions  . Adhesive [Tape] Other (See Comments)    Can burn skin if left on too long  . Celebrex [Celecoxib] Rash  . Detrol [Tolterodine] Hives    Patient Measurements: Height: 5\' 3"  (160 cm) Weight: 124 lb (56.2 kg) IBW/kg (Calculated) : 52.4  Vital Signs: Temp: 98.5 F (36.9 C) (08/15 0620) Temp Source: Oral (08/15 0620) BP: 116/49 (08/15 0620) Pulse Rate: 70 (08/15 0620)  Labs:  Recent Labs  07/01/16 0030 07/01/16 0330 07/01/16 0702 07/01/16 1105 07/02/16 0515 07/03/16 0355  HGB 10.9*  --   --   --   --   --   HCT 34.0*  --   --   --   --   --   PLT 338  --   --   --   --   --   LABPROT  --  28.9*  --   --  45.9* 44.9*  INR  --  2.66  --   --  4.74* 4.62*  CREATININE 1.53* 1.22* 0.79 0.82 0.64  --     Estimated Creatinine Clearance: 65.7 mL/min (by C-G formula based on SCr of 0.8 mg/dL).   Medical History: Past Medical History:  Diagnosis Date  . Allergy   . Anxiety   . Aortic stenosis   . CAD (coronary artery disease)   . CHF (congestive heart failure) (HCC)   . Depression   . Diabetes (HCC) dx'd 1982   "went straight to insulin"  . Dyslipidemia   . GERD (gastroesophageal reflux disease)   . Heart murmur   . HTN (hypertension)   . Hypercholesteremia   . Myocardial infarction (HCC) 12/2014  . Obesity   . Pneumonia 11/2014; 12/2014  . Rheumatoid arthritis(714.0)    "hands" (07/01/2015)  . Stroke syndrome [facility]) 1995; 2001   "when my son was born; problems w/speech and L hand since then" (01/19/2015)  . Thrombophlebitis     Assessment: 55 yr female to be admitted for DKA.  PMH significant for DM, HTN, HLD, CAD (s/p stent, CABG), CVA, AVR with mechanical valve.  Patient on warfarin therapy PTA; pharmacy to resume while inpatient.   Baseline INR: 2.66  Prior anticoagulation: warfarin 7.5 mg daily; LD 8/12  PM  Significant events:  Today, 07/03/2016:  CBC: hgb sl low on 8/13, Plt wnl  INR remains supratherapeutic after holding dose x 1 day  Major drug interactions: none  No bleeding issues per nursing  Eating 100% of meals  Goal of Therapy: INR 2.5-3.5  Plan:  Continue to hold warfarin tonight  Daily INR  CBC at least q72 hr while on warfarin  Monitor for signs of bleeding or thrombosis   9/13, PharmD Pager: [phone] 07/03/2016, 11:15 AM

## 2016-07-03 NOTE — Discharge Summary (Addendum)
Physician Discharge Summary  Madison [name] PNT:[id number] DOB: 09/24/1961 DOA: 06/30/2016  PCP: Dorothyann Peng, NP  Admit date: 06/30/2016 Discharge date: 07/03/2016  Admitted From: Home Disposition:  Home   Recommendations for Outpatient Follow-up:  1. Follow up with PCP in 1-2 weeks 2. Please obtain BMP in 2 days 3. Please hold warfarin and wait for INR to be in therapeutic range of 2.5-3.5 secondary to valve  Home Health: No Equipment/Devices: No  Discharge Condition: Stable CODE STATUS: Full code Diet recommendation: Carb Modified  Brief/Interim Summary: Madison [name] is a 55 year old female with a history of type 1 diabetes, hypertension, hyperlipidemia, GERD, depression, CAD, CABG, dCHF, CVA x5, AVR s/p mechanical valve that presented in DKA. She was started on the glucomander and anion gap closed. Home Levemir restarted at 9 units twice a day, however she had hypoglycemia to the 40s. Levemir has been adjusted to 8 units twice a day. Patient also had left ear pain which was significant for a left outer ear infection. Symptoms improved with Corticosporin. INR on admission was 2.66. Home dose of Coumadin 7.58m was given and repeat INR was 4.74. INR held overnight and repeat INR this AM of 4.62. Patient has no hemoptysis, hematemesis, or hematochezia.   Discharge Diagnoses:  Principal Problem:   DKA, type 1 ([place] Active Problems:   CAD (coronary artery disease)   Long term (current) use of anticoagulants   CVA (cerebral infarction)   Depression   Diabetes type 1, uncontrolled (HCC)   S/P aortic valve replacement   Essential hypertension   Hyperkalemia   Chronic diastolic heart failure (HCC)   AKI (acute kidney injury) ([facility]   Left ear pain    Discharge Instructions  Discharge Instructions    Diet Carb Modified    Complete by:  As directed       Medication List    STOP taking these medications   enoxaparin 60 MG/0.6ML injection Commonly known as:  LOVENOX      TAKE these medications   citalopram 40 MG tablet Commonly known as:  CELEXA Take 1 tablet (40 mg total) by mouth daily.   furosemide 20 MG tablet Commonly known as:  LASIX Take 1 tablet (20 mg total) by mouth daily.   glucose blood test strip Use as instructed What changed:  how much to take  how to take this  when to take this  reasons to take this  additional instructions   insulin aspart 100 UNIT/ML injection Commonly known as:  novoLOG Inject 2-10 Units into the skin 3 (three) times daily between meals as needed for high blood sugar (sliding scale insulin). 121-150=2 units, 151-200=3 units, 201-250=5 units, 251-300=6 units, 301-350=7 units, 351-400=10 units, >400 units call MD What changed:  Another medication with the same name was removed. Continue taking this medication, and follow the directions you see here.   insulin detemir 100 UNIT/ML injection Commonly known as:  LEVEMIR Inject 0.08 mLs (8 Units total) into the skin 2 (two) times daily. What changed:  how much to take   NEOMYCIN-POLYMYXIN-HYDROCORTISONE 1 % Soln otic solution Commonly known as:  CORTISPORIN Place 3 drops into the left ear every 6 (six) hours.   NUTRITIONAL DRINK PO Take 120 mLs by mouth 3 (three) times daily. Sugar Free [employer] Med Pass 2.0 What changed:  Another medication with the same name was removed. Continue taking this medication, and follow the directions you see here.   omeprazole 40 MG capsule Commonly known as:  PRILOSEC TAKE ONE  CAPSULE BY MOUTH EVERY DAY   ONE TOUCH BASIC SYSTEM w/Device Kit Use to check glucose 2x per day   pravastatin 20 MG tablet Commonly known as:  PRAVACHOL TAKE 1 TABLET BY MOUTH EVERY DAY   Vitamin D (Ergocalciferol) 50000 units Caps capsule Commonly known as:  DRISDOL Take 50,000 Units by mouth every 7 (seven) days.   warfarin 7.5 MG tablet Commonly known as:  COUMADIN Take 1 tablet (7.5 mg total) by mouth daily. Hold for until INR in  therapeutic range of 2.5-3.5 What changed:  additional instructions       Allergies  Allergen Reactions  . Adhesive [Tape] Other (See Comments)    Can burn skin if left on too long  . Celebrex [Celecoxib] Rash  . Detrol [Tolterodine] Hives    Consultations:  None   Procedures/Studies: No results found.   Subjective: Patient reports no problems overnight. She has no nausea or vomiting. Ear pain is significantly improved  Discharge Exam: Vitals:   07/02/16 2150 07/03/16 0620  BP: 99/60 (!) 116/49  Pulse: 79 70  Resp: 18 18  Temp: 98.8 F (37.1 C) 98.5 F (36.9 C)   Vitals:   07/02/16 0700 07/02/16 1008 07/02/16 2150 07/03/16 0620  BP:  (!) 118/55 99/60 (!) 116/49  Pulse:  73 79 70  Resp:  _0 Temp: 97.7 F (36.5 C) 98.4 F (36.9 C) 98.8 F (37.1 C) 98.5 F (36.9 C)  TempSrc: Oral Oral Oral Oral  SpO2:  98% 98% 100%  Weight:  56.2 kg (124 lb)    Height:  _1  (1.6 m)      General: Pt is alert, awake, not in acute distress, Thin  Cardiovascular: RRR, S1/S2 +, no rubs, no gallops, 2/6 systolic murmur present  Respiratory: CTA bilaterally, no wheezing, no rhonchi Abdominal: Soft, NT, ND, bowel sounds + Extremities: no edema, no cyanosis    The results of significant diagnostics from this hospitalization (including imaging, microbiology, ancillary and laboratory) are listed below for reference.     Microbiology: Recent Results (from the past 240 hour(s))  Culture, blood (routine x 2)     Status: None (Preliminary result)   Collection Time: 07/01/16  3:20 AM  Result Value Ref Range Status   Specimen Description BLOOD RIGHT WRIST  Final   Special Requests IN PEDIATRIC BOTTLE 2ML  Final   Culture   Final    NO GROWTH 1 DAY Performed at [facility]    Report Status PENDING  Incomplete  Culture, blood (routine x 2)     Status: None (Preliminary result)   Collection Time: 07/01/16  3:30 AM  Result Value Ref Range Status   Specimen  Description BLOOD RIGHT ANTECUBITAL  Final   Special Requests IN PEDIATRIC BOTTLE 2ML  Final   Culture   Final    NO GROWTH 1 DAY Performed at [facility]    Report Status PENDING  Incomplete  MRSA PCR Screening     Status: None   Collection Time: 07/01/16  4:17 AM  Result Value Ref Range Status   MRSA by PCR NEGATIVE NEGATIVE Final    Comment:        The GeneXpert MRSA Assay (FDA approved for NASAL specimens only), is one component of a comprehensive MRSA colonization surveillance program. It is not intended to diagnose MRSA infection nor to guide or monitor treatment for MRSA infections.      Labs: BNP (last 3 results)  Recent Labs  03/27/16 0832 05/25/16 1805 07/01/16 0330  BNP 73.7 86.2 779.3*   Basic Metabolic Panel:  Recent Labs Lab 07/01/16 0030 07/01/16 0330 07/01/16 0702 07/01/16 1105 07/02/16 0515  NA 127* 131* 135 134* 138  K 5.3* 3.6 4.0 4.4 3.9  CL 90* 100* 103 103 107  CO2 15* 19* 26 21* 25  GLUCOSE 910* 382* 143* 99 188*  BUN 36* 29* 26* 22* 13  CREATININE 1.53* 1.22* 0.79 0.82 0.64  CALCIUM 9.5 8.4* 8.9 8.9 8.9   Liver Function Tests:  Recent Labs Lab 07/01/16 0030  AST 23  ALT 19  ALKPHOS 105  BILITOT 1.4*  PROT 8.2*  ALBUMIN 4.3    Recent Labs Lab 07/01/16 0330  LIPASE 15   No results for input(s): AMMONIA in the last 168 hours. CBC:  Recent Labs Lab 07/01/16 0030  WBC 10.9*  NEUTROABS 9.7*  HGB 10.9*  HCT 34.0*  MCV 87.0  PLT 338   Cardiac Enzymes: No results for input(s): CKTOTAL, CKMB, CKMBINDEX, TROPONINI in the last 168 hours. BNP: Invalid input(s): POCBNP CBG:  Recent Labs Lab 07/02/16 1633 07/02/16 2144 07/03/16 0740 07/03/16 0811 07/03/16 1148  GLUCAP 269* 218* 42* 86 339*   D-Dimer No results for input(s): DDIMER in the last 72 hours. Hgb A1c No results for input(s): HGBA1C in the last 72 hours. Lipid Profile No results for input(s): CHOL, HDL, LDLCALC, TRIG, CHOLHDL, LDLDIRECT in  the last 72 hours. Thyroid function studies No results for input(s): TSH, T4TOTAL, T3FREE, THYROIDAB in the last 72 hours.  Invalid input(s): FREET3 Anemia work up No results for input(s): VITAMINB12, FOLATE, FERRITIN, TIBC, IRON, RETICCTPCT in the last 72 hours. Urinalysis    Component Value Date/Time   COLORURINE YELLOW 07/01/2016 0022   APPEARANCEUR CLEAR 07/01/2016 0022   LABSPEC 1.027 07/01/2016 0022   PHURINE 5.0 07/01/2016 0022   GLUCOSEU >1000 (A) 07/01/2016 0022   HGBUR NEGATIVE 07/01/2016 0022   BILIRUBINUR NEGATIVE 07/01/2016 0022   BILIRUBINUR n 06/16/2015 1244   KETONESUR >80 (A) 07/01/2016 0022   PROTEINUR NEGATIVE 07/01/2016 0022   UROBILINOGEN 0.2 07/30/2015 1415   NITRITE NEGATIVE 07/01/2016 0022   LEUKOCYTESUR NEGATIVE 07/01/2016 0022   Sepsis Labs Invalid input(s): PROCALCITONIN,  WBC,  LACTICIDVEN Microbiology Recent Results (from the past 240 hour(s))  Culture, blood (routine x 2)     Status: None (Preliminary result)   Collection Time: 07/01/16  3:20 AM  Result Value Ref Range Status   Specimen Description BLOOD RIGHT WRIST  Final   Special Requests IN PEDIATRIC BOTTLE 2ML  Final   Culture   Final    NO GROWTH 1 DAY Performed at [facility]    Report Status PENDING  Incomplete  Culture, blood (routine x 2)     Status: None (Preliminary result)   Collection Time: 07/01/16  3:30 AM  Result Value Ref Range Status   Specimen Description BLOOD RIGHT ANTECUBITAL  Final   Special Requests IN PEDIATRIC BOTTLE 2ML  Final   Culture   Final    NO GROWTH 1 DAY Performed at [facility]    Report Status PENDING  Incomplete  MRSA PCR Screening     Status: None   Collection Time: 07/01/16  4:17 AM  Result Value Ref Range Status   MRSA by PCR NEGATIVE NEGATIVE Final    Comment:        The GeneXpert MRSA Assay (FDA approved for NASAL specimens only), is one component of a comprehensive MRSA colonization  surveillance program. It is  not intended to diagnose MRSA infection nor to guide or monitor treatment for MRSA infections.      Time coordinating discharge: Over 30 minutes  SIGNED:   Cordelia Poche, MD  [facility] 07/03/2016, 1:49 PM Pager [phone]  If 7PM-7AM, please contact night-coverage [email or web address] Password [email or web address]

## 2016-07-03 NOTE — Progress Notes (Signed)
Results for KENDY, HASTON (MRN [id number]) as of 07/03/2016 08:44  Ref. Range 07/02/2016 12:00 07/02/2016 16:33 07/02/2016 21:44 07/03/2016 07:40 07/03/2016 08:11  Glucose-Capillary Latest Ref Range: 65 - 99 mg/dL 917 (H) 915 (H) 056 (H) 42 (LL) 86   Patient has Type 1 diabetes and needs basal insulin. Noted that CBG this am was 42 mg/dl. Had received Levemir 9 units last night.  Recommend decreasing Levemir to 7-8 units BID starting this am.  Continue Novolog SENSITIVE correction scale TID & HS. Will continue to monitor blood sugars while in the hospital. Smith Mince RN BSN CDE

## 2016-07-03 NOTE — Discharge Instructions (Signed)
Madison [name], we treated you for diabetic ketoacidosis. Her insulin has been adjusted; please take Levemir 8 units twice daily. Also found to have an outer ear infection. Please continue to use drops for 5 days.

## 2016-07-03 NOTE — Patient Outreach (Signed)
[facility]) Care Management  07/03/2016  Madison [name] [birth year] [id number]   CSW discussed patient at length this morning during "Difficult Case Discussion" with multidisciplinary team members present, offering supportive and suggestive feedback.  It was determined that CSW, RNCM, Emilia Beck and Raiford Noble, [facility] Liaison, all with [facility] Care Management, would be closing patient's case, due to inability to impact patient's health, well-being and discharge plan of care.  Patient does not adhere to recommendations made by [facility] Management staff members, inpatient hospital staff, supportive family members and/or staff at skilled nursing facility, where patient was residing to receive short-term rehabilitative services.  Patient reports wanting to return home to live at time of discharge, despite recommendations for long-term care into an assisted living facility. CSW will perform a case closure on patient, due to inability to progress patient's plan of care.  CSW will notify patient's RNCM with [facility] Care Management, Emilia Beck of CSW's plans to close patient's case.  CSW will fax an update to patient's Primary Care Physician, Dr. [name] to ensure that they are aware of CSW's involvement with patient's plan of care.  CSW will submit a case closure request to Tomasita Crumble, Care Management Assistant with [facility] Care Management, in the form of an In [employer].  CSW will ensure that Mrs. Comer is aware of Larence Penning, RNCM with [facility] Care Management, continued involvement with patient's care. Danford Bad, BSW, MSW, LCSW  Licensed Restaurant manager, fast [employer] [facility]  Mailing [address] [place], [place] [address] Physical Address-[address], [place], [place] [address] Toll Free Main #  [phone] Fax # [phone] Cell # [phone]  Fax # [phone]  [name]

## 2016-07-03 NOTE — Care Management Note (Signed)
Case Management Note  Patient Details  Name: Madison [name] MRN: [id number] Date of Birth: Apr 10, 1961  Subjective/Objective:         55 yo admitted with DKA           Action/Plan: Pt from [facility] and to DC to SNF.  Expected Discharge Date:                  Expected Discharge Plan:  Skilled Nursing Facility  In-House Referral:  Clinical Social Work  Discharge planning Services  CM Consult  Post Acute Care Choice:    Choice offered to:     DME Arranged:    DME Agency:     HH Arranged:    HH Agency:     Status of Service:  In process, will continue to follow  If discussed at Long Length of Stay Meetings, dates discussed:    Additional CommentsBartholome Bill, RN 07/03/2016, 1:13 PM [phone]

## 2016-07-03 NOTE — Progress Notes (Signed)
Attempted to call report to [name] @ [phone]. I have called on three separate calls and placed on hold for several minutes with no one responding to the call. Unsuccessful in calling report. Patient transported via [facility]

## 2016-07-03 NOTE — Progress Notes (Signed)
CRITICAL VALUE ALERT  Critical value received:  CBG 42  Date of notification: 07/06/16  Time of notification:  08:20  Critical value read back: yes  Nurse who received alert:  Rennie Plowman, RN  MD notified (1st page): Mal Misty, MD  Time of first page:  8:21  MD notified (2nd page):  Time of second page:  Responding MD:  Mal Misty, MD  Time MD responded:  8:21

## 2016-07-04 ENCOUNTER — Ambulatory Visit: Payer: Self-pay | Admitting: *Deleted

## 2016-07-04 DIAGNOSIS — I10 Essential (primary) hypertension: Secondary | ICD-10-CM | POA: Diagnosis not present

## 2016-07-04 DIAGNOSIS — G4709 Other insomnia: Secondary | ICD-10-CM | POA: Diagnosis not present

## 2016-07-04 DIAGNOSIS — I5032 Chronic diastolic (congestive) heart failure: Secondary | ICD-10-CM | POA: Diagnosis not present

## 2016-07-04 DIAGNOSIS — I5042 Chronic combined systolic (congestive) and diastolic (congestive) heart failure: Secondary | ICD-10-CM | POA: Diagnosis not present

## 2016-07-04 DIAGNOSIS — E784 Other hyperlipidemia: Secondary | ICD-10-CM | POA: Diagnosis not present

## 2016-07-04 DIAGNOSIS — F4323 Adjustment disorder with mixed anxiety and depressed mood: Secondary | ICD-10-CM | POA: Diagnosis not present

## 2016-07-04 DIAGNOSIS — E872 Acidosis: Secondary | ICD-10-CM | POA: Diagnosis not present

## 2016-07-04 DIAGNOSIS — N179 Acute kidney failure, unspecified: Secondary | ICD-10-CM | POA: Diagnosis not present

## 2016-07-04 DIAGNOSIS — H6090 Unspecified otitis externa, unspecified ear: Secondary | ICD-10-CM | POA: Diagnosis not present

## 2016-07-04 DIAGNOSIS — E1010 Type 1 diabetes mellitus with ketoacidosis without coma: Secondary | ICD-10-CM | POA: Diagnosis not present

## 2016-07-04 DIAGNOSIS — E1069 Type 1 diabetes mellitus with other specified complication: Secondary | ICD-10-CM | POA: Diagnosis not present

## 2016-07-04 DIAGNOSIS — Z952 Presence of prosthetic heart valve: Secondary | ICD-10-CM | POA: Diagnosis not present

## 2016-07-04 DIAGNOSIS — M8000XD Age-related osteoporosis with current pathological fracture, unspecified site, subsequent encounter for fracture with routine healing: Secondary | ICD-10-CM | POA: Diagnosis not present

## 2016-07-04 DIAGNOSIS — E1169 Type 2 diabetes mellitus with other specified complication: Secondary | ICD-10-CM | POA: Diagnosis not present

## 2016-07-06 LAB — CULTURE, BLOOD (ROUTINE X 2)
CULTURE: NO GROWTH
Culture: NO GROWTH

## 2016-07-09 ENCOUNTER — Other Ambulatory Visit: Payer: Self-pay

## 2016-07-09 NOTE — Patient Outreach (Signed)
    This RNCM received telephone call from patient who identified he self by providing date of birth and address. patient stated the reason for her call was to see if it was okay for her to go home.  This RNCM advised patient that RNCM could not advise her on a course of action as she is no longer being followed by [facility] case management due to non-compliance with care plan. Patient reminded of previous conversation she had with [facility] [place], advising her of being discharged.    Patient screamed a profanity, stated she was not told SHIT! Patient stated okay and call was disconnected.  Plan: Send case closure to Tomasita Crumble, [place] CM, Danford Bad has documented sending case closure to her primary care provider.

## 2016-07-12 DIAGNOSIS — H6090 Unspecified otitis externa, unspecified ear: Secondary | ICD-10-CM | POA: Diagnosis not present

## 2016-07-12 DIAGNOSIS — Z952 Presence of prosthetic heart valve: Secondary | ICD-10-CM | POA: Diagnosis not present

## 2016-07-12 DIAGNOSIS — E108 Type 1 diabetes mellitus with unspecified complications: Secondary | ICD-10-CM | POA: Diagnosis not present

## 2016-07-12 DIAGNOSIS — R739 Hyperglycemia, unspecified: Secondary | ICD-10-CM | POA: Diagnosis not present

## 2016-07-16 DIAGNOSIS — Z5181 Encounter for therapeutic drug level monitoring: Secondary | ICD-10-CM | POA: Diagnosis not present

## 2016-07-16 DIAGNOSIS — R278 Other lack of coordination: Secondary | ICD-10-CM | POA: Diagnosis not present

## 2016-07-16 DIAGNOSIS — E1069 Type 1 diabetes mellitus with other specified complication: Secondary | ICD-10-CM | POA: Diagnosis not present

## 2016-07-16 DIAGNOSIS — I10 Essential (primary) hypertension: Secondary | ICD-10-CM | POA: Diagnosis not present

## 2016-07-16 DIAGNOSIS — R2689 Other abnormalities of gait and mobility: Secondary | ICD-10-CM | POA: Diagnosis not present

## 2016-07-16 DIAGNOSIS — E785 Hyperlipidemia, unspecified: Secondary | ICD-10-CM | POA: Diagnosis not present

## 2016-07-16 DIAGNOSIS — K219 Gastro-esophageal reflux disease without esophagitis: Secondary | ICD-10-CM | POA: Diagnosis not present

## 2016-07-16 DIAGNOSIS — M6281 Muscle weakness (generalized): Secondary | ICD-10-CM | POA: Diagnosis not present

## 2016-07-16 DIAGNOSIS — I5042 Chronic combined systolic (congestive) and diastolic (congestive) heart failure: Secondary | ICD-10-CM | POA: Diagnosis not present

## 2016-07-17 DIAGNOSIS — E785 Hyperlipidemia, unspecified: Secondary | ICD-10-CM | POA: Diagnosis not present

## 2016-07-17 DIAGNOSIS — E872 Acidosis: Secondary | ICD-10-CM | POA: Diagnosis not present

## 2016-07-17 DIAGNOSIS — E1010 Type 1 diabetes mellitus with ketoacidosis without coma: Secondary | ICD-10-CM | POA: Diagnosis not present

## 2016-07-17 DIAGNOSIS — I5032 Chronic diastolic (congestive) heart failure: Secondary | ICD-10-CM | POA: Diagnosis not present

## 2016-07-17 DIAGNOSIS — I10 Essential (primary) hypertension: Secondary | ICD-10-CM | POA: Diagnosis not present

## 2016-07-18 DIAGNOSIS — I5042 Chronic combined systolic (congestive) and diastolic (congestive) heart failure: Secondary | ICD-10-CM | POA: Diagnosis not present

## 2016-07-18 DIAGNOSIS — E785 Hyperlipidemia, unspecified: Secondary | ICD-10-CM | POA: Diagnosis not present

## 2016-07-18 DIAGNOSIS — E1069 Type 1 diabetes mellitus with other specified complication: Secondary | ICD-10-CM | POA: Diagnosis not present

## 2016-07-18 DIAGNOSIS — I10 Essential (primary) hypertension: Secondary | ICD-10-CM | POA: Diagnosis not present

## 2016-07-18 DIAGNOSIS — M6281 Muscle weakness (generalized): Secondary | ICD-10-CM | POA: Diagnosis not present

## 2016-07-18 DIAGNOSIS — Z5181 Encounter for therapeutic drug level monitoring: Secondary | ICD-10-CM | POA: Diagnosis not present

## 2016-07-18 DIAGNOSIS — R278 Other lack of coordination: Secondary | ICD-10-CM | POA: Diagnosis not present

## 2016-07-18 DIAGNOSIS — K219 Gastro-esophageal reflux disease without esophagitis: Secondary | ICD-10-CM | POA: Diagnosis not present

## 2016-07-18 DIAGNOSIS — R2689 Other abnormalities of gait and mobility: Secondary | ICD-10-CM | POA: Diagnosis not present

## 2016-07-20 DIAGNOSIS — I5042 Chronic combined systolic (congestive) and diastolic (congestive) heart failure: Secondary | ICD-10-CM | POA: Diagnosis not present

## 2016-07-20 DIAGNOSIS — Z5181 Encounter for therapeutic drug level monitoring: Secondary | ICD-10-CM | POA: Diagnosis not present

## 2016-07-20 DIAGNOSIS — K219 Gastro-esophageal reflux disease without esophagitis: Secondary | ICD-10-CM | POA: Diagnosis not present

## 2016-07-20 DIAGNOSIS — M6281 Muscle weakness (generalized): Secondary | ICD-10-CM | POA: Diagnosis not present

## 2016-07-20 DIAGNOSIS — I10 Essential (primary) hypertension: Secondary | ICD-10-CM | POA: Diagnosis not present

## 2016-07-20 DIAGNOSIS — E1069 Type 1 diabetes mellitus with other specified complication: Secondary | ICD-10-CM | POA: Diagnosis not present

## 2016-07-20 DIAGNOSIS — E785 Hyperlipidemia, unspecified: Secondary | ICD-10-CM | POA: Diagnosis not present

## 2016-07-20 DIAGNOSIS — R278 Other lack of coordination: Secondary | ICD-10-CM | POA: Diagnosis not present

## 2016-07-20 DIAGNOSIS — R2689 Other abnormalities of gait and mobility: Secondary | ICD-10-CM | POA: Diagnosis not present

## 2016-07-22 DIAGNOSIS — K219 Gastro-esophageal reflux disease without esophagitis: Secondary | ICD-10-CM | POA: Diagnosis not present

## 2016-07-22 DIAGNOSIS — R278 Other lack of coordination: Secondary | ICD-10-CM | POA: Diagnosis not present

## 2016-07-22 DIAGNOSIS — E1069 Type 1 diabetes mellitus with other specified complication: Secondary | ICD-10-CM | POA: Diagnosis not present

## 2016-07-22 DIAGNOSIS — I5042 Chronic combined systolic (congestive) and diastolic (congestive) heart failure: Secondary | ICD-10-CM | POA: Diagnosis not present

## 2016-07-22 DIAGNOSIS — M6281 Muscle weakness (generalized): Secondary | ICD-10-CM | POA: Diagnosis not present

## 2016-07-22 DIAGNOSIS — Z5181 Encounter for therapeutic drug level monitoring: Secondary | ICD-10-CM | POA: Diagnosis not present

## 2016-07-22 DIAGNOSIS — I10 Essential (primary) hypertension: Secondary | ICD-10-CM | POA: Diagnosis not present

## 2016-07-22 DIAGNOSIS — E785 Hyperlipidemia, unspecified: Secondary | ICD-10-CM | POA: Diagnosis not present

## 2016-07-22 DIAGNOSIS — R2689 Other abnormalities of gait and mobility: Secondary | ICD-10-CM | POA: Diagnosis not present

## 2016-07-24 DIAGNOSIS — R278 Other lack of coordination: Secondary | ICD-10-CM | POA: Diagnosis not present

## 2016-07-24 DIAGNOSIS — R2689 Other abnormalities of gait and mobility: Secondary | ICD-10-CM | POA: Diagnosis not present

## 2016-07-24 DIAGNOSIS — Z5181 Encounter for therapeutic drug level monitoring: Secondary | ICD-10-CM | POA: Diagnosis not present

## 2016-07-24 DIAGNOSIS — M6281 Muscle weakness (generalized): Secondary | ICD-10-CM | POA: Diagnosis not present

## 2016-07-24 DIAGNOSIS — I5042 Chronic combined systolic (congestive) and diastolic (congestive) heart failure: Secondary | ICD-10-CM | POA: Diagnosis not present

## 2016-07-24 DIAGNOSIS — I10 Essential (primary) hypertension: Secondary | ICD-10-CM | POA: Diagnosis not present

## 2016-07-24 DIAGNOSIS — E1069 Type 1 diabetes mellitus with other specified complication: Secondary | ICD-10-CM | POA: Diagnosis not present

## 2016-07-24 DIAGNOSIS — E785 Hyperlipidemia, unspecified: Secondary | ICD-10-CM | POA: Diagnosis not present

## 2016-07-24 DIAGNOSIS — K219 Gastro-esophageal reflux disease without esophagitis: Secondary | ICD-10-CM | POA: Diagnosis not present

## 2016-07-26 DIAGNOSIS — M6281 Muscle weakness (generalized): Secondary | ICD-10-CM | POA: Diagnosis not present

## 2016-07-26 DIAGNOSIS — E1069 Type 1 diabetes mellitus with other specified complication: Secondary | ICD-10-CM | POA: Diagnosis not present

## 2016-07-26 DIAGNOSIS — Z952 Presence of prosthetic heart valve: Secondary | ICD-10-CM | POA: Diagnosis not present

## 2016-07-26 DIAGNOSIS — R2689 Other abnormalities of gait and mobility: Secondary | ICD-10-CM | POA: Diagnosis not present

## 2016-07-26 DIAGNOSIS — K219 Gastro-esophageal reflux disease without esophagitis: Secondary | ICD-10-CM | POA: Diagnosis not present

## 2016-07-26 DIAGNOSIS — I10 Essential (primary) hypertension: Secondary | ICD-10-CM | POA: Diagnosis not present

## 2016-07-26 DIAGNOSIS — Z5181 Encounter for therapeutic drug level monitoring: Secondary | ICD-10-CM | POA: Diagnosis not present

## 2016-07-26 DIAGNOSIS — I5042 Chronic combined systolic (congestive) and diastolic (congestive) heart failure: Secondary | ICD-10-CM | POA: Diagnosis not present

## 2016-07-26 DIAGNOSIS — R739 Hyperglycemia, unspecified: Secondary | ICD-10-CM | POA: Diagnosis not present

## 2016-07-26 DIAGNOSIS — E785 Hyperlipidemia, unspecified: Secondary | ICD-10-CM | POA: Diagnosis not present

## 2016-07-26 DIAGNOSIS — R278 Other lack of coordination: Secondary | ICD-10-CM | POA: Diagnosis not present

## 2016-07-29 DIAGNOSIS — E1069 Type 1 diabetes mellitus with other specified complication: Secondary | ICD-10-CM | POA: Diagnosis not present

## 2016-07-29 DIAGNOSIS — R278 Other lack of coordination: Secondary | ICD-10-CM | POA: Diagnosis not present

## 2016-07-29 DIAGNOSIS — I10 Essential (primary) hypertension: Secondary | ICD-10-CM | POA: Diagnosis not present

## 2016-07-29 DIAGNOSIS — E785 Hyperlipidemia, unspecified: Secondary | ICD-10-CM | POA: Diagnosis not present

## 2016-07-29 DIAGNOSIS — R2689 Other abnormalities of gait and mobility: Secondary | ICD-10-CM | POA: Diagnosis not present

## 2016-07-29 DIAGNOSIS — M6281 Muscle weakness (generalized): Secondary | ICD-10-CM | POA: Diagnosis not present

## 2016-07-29 DIAGNOSIS — K219 Gastro-esophageal reflux disease without esophagitis: Secondary | ICD-10-CM | POA: Diagnosis not present

## 2016-07-29 DIAGNOSIS — Z5181 Encounter for therapeutic drug level monitoring: Secondary | ICD-10-CM | POA: Diagnosis not present

## 2016-07-29 DIAGNOSIS — I5042 Chronic combined systolic (congestive) and diastolic (congestive) heart failure: Secondary | ICD-10-CM | POA: Diagnosis not present

## 2016-07-31 ENCOUNTER — Encounter (HOSPITAL_COMMUNITY): Payer: Self-pay | Admitting: Emergency Medicine

## 2016-07-31 ENCOUNTER — Emergency Department (HOSPITAL_COMMUNITY)
Admission: EM | Admit: 2016-07-31 | Discharge: 2016-07-31 | Disposition: A | Discharge status: Home / Self Care | Payer: Commercial Managed Care - HMO | Attending: Emergency Medicine | Admitting: Emergency Medicine

## 2016-07-31 DIAGNOSIS — Z7901 Long term (current) use of anticoagulants: Secondary | ICD-10-CM | POA: Diagnosis not present

## 2016-07-31 DIAGNOSIS — E785 Hyperlipidemia, unspecified: Secondary | ICD-10-CM | POA: Diagnosis not present

## 2016-07-31 DIAGNOSIS — R739 Hyperglycemia, unspecified: Secondary | ICD-10-CM

## 2016-07-31 DIAGNOSIS — I5042 Chronic combined systolic (congestive) and diastolic (congestive) heart failure: Secondary | ICD-10-CM | POA: Diagnosis not present

## 2016-07-31 DIAGNOSIS — Z952 Presence of prosthetic heart valve: Secondary | ICD-10-CM | POA: Insufficient documentation

## 2016-07-31 DIAGNOSIS — I110 Hypertensive heart disease with heart failure: Secondary | ICD-10-CM | POA: Insufficient documentation

## 2016-07-31 DIAGNOSIS — R278 Other lack of coordination: Secondary | ICD-10-CM | POA: Diagnosis not present

## 2016-07-31 DIAGNOSIS — I2510 Atherosclerotic heart disease of native coronary artery without angina pectoris: Secondary | ICD-10-CM | POA: Diagnosis not present

## 2016-07-31 DIAGNOSIS — E1365 Other specified diabetes mellitus with hyperglycemia: Secondary | ICD-10-CM | POA: Diagnosis not present

## 2016-07-31 DIAGNOSIS — E1065 Type 1 diabetes mellitus with hyperglycemia: Secondary | ICD-10-CM | POA: Insufficient documentation

## 2016-07-31 DIAGNOSIS — Z79899 Other long term (current) drug therapy: Secondary | ICD-10-CM | POA: Diagnosis not present

## 2016-07-31 DIAGNOSIS — E1011 Type 1 diabetes mellitus with ketoacidosis with coma: Secondary | ICD-10-CM | POA: Diagnosis not present

## 2016-07-31 DIAGNOSIS — E108 Type 1 diabetes mellitus with unspecified complications: Secondary | ICD-10-CM | POA: Diagnosis not present

## 2016-07-31 DIAGNOSIS — I10 Essential (primary) hypertension: Secondary | ICD-10-CM | POA: Diagnosis not present

## 2016-07-31 DIAGNOSIS — N390 Urinary tract infection, site not specified: Secondary | ICD-10-CM | POA: Insufficient documentation

## 2016-07-31 DIAGNOSIS — M6281 Muscle weakness (generalized): Secondary | ICD-10-CM | POA: Diagnosis not present

## 2016-07-31 DIAGNOSIS — E1165 Type 2 diabetes mellitus with hyperglycemia: Secondary | ICD-10-CM | POA: Diagnosis not present

## 2016-07-31 DIAGNOSIS — R2689 Other abnormalities of gait and mobility: Secondary | ICD-10-CM | POA: Diagnosis not present

## 2016-07-31 DIAGNOSIS — M79606 Pain in leg, unspecified: Secondary | ICD-10-CM | POA: Diagnosis not present

## 2016-07-31 DIAGNOSIS — Z955 Presence of coronary angioplasty implant and graft: Secondary | ICD-10-CM | POA: Insufficient documentation

## 2016-07-31 DIAGNOSIS — I5032 Chronic diastolic (congestive) heart failure: Secondary | ICD-10-CM | POA: Diagnosis not present

## 2016-07-31 DIAGNOSIS — E1069 Type 1 diabetes mellitus with other specified complication: Secondary | ICD-10-CM | POA: Diagnosis not present

## 2016-07-31 DIAGNOSIS — K219 Gastro-esophageal reflux disease without esophagitis: Secondary | ICD-10-CM | POA: Diagnosis not present

## 2016-07-31 DIAGNOSIS — E162 Hypoglycemia, unspecified: Secondary | ICD-10-CM | POA: Diagnosis not present

## 2016-07-31 DIAGNOSIS — Z5181 Encounter for therapeutic drug level monitoring: Secondary | ICD-10-CM | POA: Diagnosis not present

## 2016-07-31 LAB — URINALYSIS, ROUTINE W REFLEX MICROSCOPIC
BILIRUBIN URINE: NEGATIVE
Glucose, UA: NEGATIVE mg/dL
HGB URINE DIPSTICK: NEGATIVE
Ketones, ur: NEGATIVE mg/dL
Nitrite: NEGATIVE
PROTEIN: NEGATIVE mg/dL
Specific Gravity, Urine: 1.006 (ref 1.005–1.030)
pH: 6.5 (ref 5.0–8.0)

## 2016-07-31 LAB — CBC WITH DIFFERENTIAL/PLATELET
BASOS PCT: 0 %
Basophils Absolute: 0 10*3/uL (ref 0.0–0.1)
EOS ABS: 0.1 10*3/uL (ref 0.0–0.7)
EOS PCT: 1 %
HCT: 31.9 % — ABNORMAL LOW (ref 36.0–46.0)
Hemoglobin: 10.2 g/dL — ABNORMAL LOW (ref 12.0–15.0)
Lymphocytes Relative: 34 %
Lymphs Abs: 2.5 10*3/uL (ref 0.7–4.0)
MCH: 25.9 pg — ABNORMAL LOW (ref 26.0–34.0)
MCHC: 32 g/dL (ref 30.0–36.0)
MCV: 81 fL (ref 78.0–100.0)
MONO ABS: 0.5 10*3/uL (ref 0.1–1.0)
MONOS PCT: 6 %
Neutro Abs: 4.3 10*3/uL (ref 1.7–7.7)
Neutrophils Relative %: 59 %
Platelets: 279 10*3/uL (ref 150–400)
RBC: 3.94 MIL/uL (ref 3.87–5.11)
RDW: 15.4 % (ref 11.5–15.5)
WBC: 7.5 10*3/uL (ref 4.0–10.5)

## 2016-07-31 LAB — BLOOD GAS, VENOUS
ACID-BASE EXCESS: 3.5 mmol/L — AB (ref 0.0–2.0)
Bicarbonate: 27.9 mmol/L (ref 20.0–28.0)
DRAWN BY: 295031
FIO2: 21
O2 SAT: 63.6 %
PCO2 VEN: 43.7 mmHg — AB (ref 44.0–60.0)
PO2 VEN: 34.8 mmHg (ref 32.0–45.0)
Patient temperature: 98.6
pH, Ven: 7.421 (ref 7.250–7.430)

## 2016-07-31 LAB — COMPREHENSIVE METABOLIC PANEL
ALT: 16 U/L (ref 14–54)
AST: 30 U/L (ref 15–41)
Albumin: 3.5 g/dL (ref 3.5–5.0)
Alkaline Phosphatase: 98 U/L (ref 38–126)
Anion gap: 7 (ref 5–15)
BUN: 15 mg/dL (ref 6–20)
CHLORIDE: 99 mmol/L — AB (ref 101–111)
CO2: 28 mmol/L (ref 22–32)
CREATININE: 0.84 mg/dL (ref 0.44–1.00)
Calcium: 9 mg/dL (ref 8.9–10.3)
Glucose, Bld: 161 mg/dL — ABNORMAL HIGH (ref 65–99)
POTASSIUM: 5.2 mmol/L — AB (ref 3.5–5.1)
Sodium: 134 mmol/L — ABNORMAL LOW (ref 135–145)
TOTAL PROTEIN: 7.5 g/dL (ref 6.5–8.1)
Total Bilirubin: 0.5 mg/dL (ref 0.3–1.2)

## 2016-07-31 LAB — PROTIME-INR
INR: 1
PROTHROMBIN TIME: 13.2 s (ref 11.4–15.2)

## 2016-07-31 LAB — URINE MICROSCOPIC-ADD ON

## 2016-07-31 LAB — CBG MONITORING, ED: GLUCOSE-CAPILLARY: 187 mg/dL — AB (ref 65–99)

## 2016-07-31 MED ORDER — CEPHALEXIN 500 MG PO CAPS: 500.0000 mg | ORAL_CAPSULE | Freq: Two times a day (BID) | ORAL | 0 refills | Status: DC

## 2016-07-31 NOTE — ED Triage Notes (Signed)
Pt from [facility] via EMS with complaints of hyperglycemia and bilateral lower extremity pain. Per pt her glucometer read "high" Per EMS, pt cbg was 317

## 2016-07-31 NOTE — Discharge Instructions (Signed)
Make sure to limit your carbohydrates and sugar intake to help keep your blood sugar under control. Continue all your home medications as directed. Start for UTI. Follow-up with your primary care doctor. Return here for any new or worsening symptoms.

## 2016-07-31 NOTE — ED Notes (Signed)
[facility] called for transport.  

## 2016-07-31 NOTE — ED Provider Notes (Signed)
[facility] DEPT Provider Note   CSN: [id number] Arrival date & time: 07/31/16  1631     History   Chief Complaint Chief Complaint  Patient presents with  . Hyperglycemia    HPI Madison [name] is a 55 y.o. female.  The history is provided by the patient and medical records.  Hyperglycemia  Associated symptoms: polyuria     55 year old female with history of type 1 diabetes which is poorly controlled, hypertension, hyperlipidemia, rheumatoid arthritis, congestive heart failure, coronary artery disease, aortic stenosis status post valve replacement on Coumadin, presenting to the ED with concerns about her blood sugar. Patient is currently at [facility] and was sent here due to hyperglycemia.  Patient states her CBG machine was reading "high earlier".  On arrival here glucose is 187.  Patient states otherwise she fells fine-- no nausea, vomiting, abdominal pain, fatigue, weakness, or dizziness.  No excessive urination but states she has been thirsty.  States she has been eating and drinking normally.  States she does have some pain in her legs which comes and goes.  Denies new injury, trauma, or falls.  No swelling of the legs.  No fever, chills, sweats.  Has been taking her medications as directed.  No chest pain or SOB.  Patient is requesting to eat upon my evaluation.  Past Medical History:  Diagnosis Date  . Allergy   . Anxiety   . Aortic stenosis   . CAD (coronary artery disease)   . CHF (congestive heart failure) ([place])   . Depression   . Diabetes ([place]) dx'd 1982   "went straight to insulin"  . Dyslipidemia   . GERD (gastroesophageal reflux disease)   . Heart murmur   . HTN (hypertension)   . Hypercholesteremia   . Myocardial infarction ([place]) 12/2014  . Obesity   . Pneumonia 11/2014; 12/2014  . Rheumatoid arthritis(714.0)    "hands" (07/01/2015)  . Stroke syndrome [facility]) 1995; 2001   "when my son was born; problems w/speech and L hand since then"  (01/19/2015)  . Thrombophlebitis     Patient Active Problem List   Diagnosis Date Noted  . Left ear pain 07/01/2016  . Hyperglycemia 06/06/2016  . Malnutrition of moderate degree 06/04/2016  . Septic shock ([place]) 06/04/2016  . SIRS (systemic inflammatory response syndrome) ([place]) 05/25/2016  . Type 1 diabetes mellitus with ketoacidosis without coma ([place])   . MDD (major depressive disorder), recurrent episode, moderate ([place])   . Aspiration pneumonia ([employer]) 05/17/2016  . Diabetic ketoacidosis without coma ([place])   . Sepsis ([place])   . 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 MSOF (multiple systems organ failure) 05/01/2016  . Acute hypoxemic respiratory failure ([place])   . Type I diabetes mellitus with complication, uncontrolled ([place]) 03/29/2016  . Adjustment disorder with mixed anxiety and depressed mood 03/28/2016  . HLD (hyperlipidemia) 03/27/2016  . AKI (acute kidney injury) ([place]) 03/27/2016  . Chronic combined systolic and diastolic congestive heart failure ([place])   . Diabetic ketoacidosis ([place]) 03/26/2016  . Type 1 diabetes, HbA1c goal < 7% (HCC)   . Hypotension 03/08/2016  . Uncontrolled type 1 diabetes mellitus with ketoacidotic coma ([place])   . Acute encephalopathy 02/28/2016  . Hyponatremia: Pseudo 02/28/2016  . Lactic acidosis   . Diabetic ketoacidosis with coma associated with type 1 diabetes mellitus ([place])   . Hypoglycemia   . Chronic diastolic heart failure ([place])   . Esophageal reflux   . S/P AVR (aortic valve replacement)   . ARF (  acute renal failure) ([place]) 01/22/2016  . CHF (congestive heart failure) ([place]) 01/22/2016  . Diabetic ketoacidosis without coma associated with type 1 diabetes mellitus ([place])   . DKA, type 1 ([place]) 11/23/2015  . Constipation 11/23/2015  . Leukocytosis   . Pressure ulcer 08/02/2015  . Diabetic ketoacidosis with coma associated with other specified diabetes mellitus ([place])   . Closed left ankle fracture 07/01/2015  . Trimalleolar fracture of  left ankle 07/01/2015  . Hyperkalemia   . Insomnia 06/16/2015  . Normocytic anemia 01/31/2015  . Acute on chronic combined systolic and diastolic CHF (congestive heart failure) ([place])   . Essential hypertension   . Elevated troponin   . Diabetes type 1, uncontrolled ([place])   . Demand ischemia ([place])   . S/P aortic valve replacement   . Hypokalemia   . NSTEMI (non-ST elevated myocardial infarction) ([place]) 12/08/2014  . Blood poisoning ([place])   . Supratherapeutic INR   . Severe sepsis with acute organ dysfunction ([place]) 12/03/2014  . Spastic hemiplegia affecting nondominant side ([place]) 11/29/2014  . Dysphagia, pharyngoesophageal phase 10/04/2014  . Sinus tachycardia ([place]) 09/30/2014  . Depression 04/29/2014  . CVA (cerebral infarction) 03/12/2014  . Acute respiratory failure ([place]) 03/12/2014  . Chronic anticoagulation 07/28/2013  . Long term (current) use of anticoagulants 07/28/2013  . CAD (coronary artery disease)   . Aortic stenosis   . Hyperlipidemia   . Rheumatoid arthritis [facility])     Past Surgical History:  Procedure Laterality Date  . AORTIC VALVE REPLACEMENT (AVR)/CORONARY ARTERY BYPASS GRAFTING (CABG)  "2014"   Archie Endo 03/08/2014  . CARDIAC CATHETERIZATION  12/08/2014  . CATARACT EXTRACTION Left   . CESAREAN SECTION  1995  . CORONARY ANGIOPLASTY WITH STENT PLACEMENT  12/09/2014  . CORONARY ARTERY BYPASS GRAFT    . FOOT FRACTURE SURGERY    . FRACTURE SURGERY    . KNEE ARTHROSCOPY Right   . LEFT HEART CATHETERIZATION WITH CORONARY ANGIOGRAM N/A 12/08/2014   Procedure: LEFT HEART CATHETERIZATION WITH CORONARY ANGIOGRAM;  Surgeon: Wellington Hampshire, MD;  Location: [facility] CATH LAB;  Service: Cardiovascular;  Laterality: N/A;  . NEUROPLASTY / TRANSPOSITION MEDIAN NERVE AT CARPAL TUNNEL    . ORIF ANKLE FRACTURE Left 07/04/2015   Procedure: OPEN REDUCTION INTERNAL FIXATION (ORIF)  TRIMAL ANKLE FRACTURE;  Surgeon: Leandrew Koyanagi, MD;  Location: [facility];  Service: Orthopedics;  Laterality: Left;    . PERCUTANEOUS CORONARY STENT INTERVENTION (PCI-S) N/A 12/09/2014   Procedure: PERCUTANEOUS CORONARY STENT INTERVENTION (PCI-S);  Surgeon: Leonie Man, MD;  Location[facility] C[facility] LAB;  Service: Cardiovascular;  Laterality: N/A;  . TUBAL LIGATION  1995    OB History    No data available       Home Medications    Prior to Admission medications   Medication Sig Start Date End Date Taking? Authorizing Provider  Blood Glucose Monitoring Suppl ([place]) w/Device KIT Use to check glucose 2x per day 03/16/16   Dorothyann Peng, NP  citalopram (CELEXA) 40 MG tablet Take 1 tablet (40 mg total) by mouth daily. 03/08/16   Dorothyann Peng, NP  furosemide (LASIX) 20 MG tablet Take 1 tablet (20 mg total) by mouth daily. 05/22/16   Domenic Polite, MD  glucose blood test strip Use as instructed Patient taking differently: 1 each by Other route as needed (as instructed). Use as instructed 03/16/16   Dorothyann Peng, NP  insulin aspart (NOVOLOG) 100 UNIT/ML injection Inject 2-10 Units into the skin 3 (three) times daily between meals  as needed for high blood sugar (sliding scale insulin). 121-150=2 units, 151-200=3 units, 201-250=5 units, 251-300=6 units, 301-350=7 units, 351-400=10 units, >400 units call MD    Historical Provider, MD  insulin detemir (LEVEMIR) 100 UNIT/ML injection Inject 0.08 mLs (8 Units total) into the skin 2 (two) times daily. 07/03/16   Mariel Aloe, MD  Nutritional Supplements (NUTRITIONAL DRINK PO) Take 120 mLs by mouth 3 (three) times daily. Sugar Free [employer] Med Pass 2.0    Historical Provider, MD  omeprazole (PRILOSEC) 40 MG capsule TAKE ONE CAPSULE BY MOUTH EVERY DAY 03/20/16   Dorothyann Peng, NP  pravastatin (PRAVACHOL) 20 MG tablet TAKE 1 TABLET BY MOUTH EVERY DAY Patient not taking: Reported on 07/01/2016 06/12/16   Dorothyann Peng, NP  Vitamin D, Ergocalciferol, (DRISDOL) 50000 units CAPS capsule Take 50,000 Units by mouth every 7 (seven) days.    Historical Provider, MD   warfarin (COUMADIN) 7.5 MG tablet Take 1 tablet (7.5 mg total) by mouth daily. Hold for until INR in therapeutic range of 2.5-3.5 07/03/16   Mariel Aloe, MD    Family History Family History  Problem Relation Age of Onset  . Heart disease Mother   . Heart disease Sister   . Stroke      Social History Social History  Substance Use Topics  . Smoking status: Never Smoker  . Smokeless tobacco: Never Used  . Alcohol use No     Allergies   Adhesive [tape]; Celebrex [celecoxib]; and Detrol [tolterodine]   Review of Systems Review of Systems  Endocrine: Positive for polyuria.  All other systems reviewed and are negative.    Physical Exam Updated Vital Signs BP 120/56 (BP Location: Right Arm)   Pulse 80   Temp 98.9 F (37.2 C) (Oral)   Resp 16   SpO2 93%   Physical Exam  Constitutional: She is oriented to person, place, and time. She appears well-developed and well-nourished.  Watching TV and drinking water during exam, no distress  HENT:  Head: Normocephalic and atraumatic.  Mouth/Throat: Oropharynx is clear and moist.  Eyes: Conjunctivae and EOM are normal. Pupils are equal, round, and reactive to light.  Neck: Normal range of motion.  Cardiovascular: Normal rate, regular rhythm and normal heart sounds.   Pulmonary/Chest: Effort normal and breath sounds normal. No respiratory distress. She has no wheezes.  Abdominal: Soft. Bowel sounds are normal.  Musculoskeletal: Normal range of motion.  Legs normal in appearance without swelling, bony deformity, overlying skin changes, warmth to touch, no rashes or open wounds, DP pulses intact bilaterally, normal sensation throughout  Neurological: She is alert and oriented to person, place, and time.  Skin: Skin is warm and dry.  Psychiatric: She has a normal mood and affect.  Nursing note and vitals reviewed.    ED Treatments / Results  Labs (all labs ordered are listed, but only abnormal results are displayed) Labs  Reviewed  URINALYSIS, ROUTINE W REFLEX MICROSCOPIC (NOT AT [facility]) - Abnormal; Notable for the following:       Result Value   Leukocytes, UA MODERATE (*)    All other components within normal limits  BLOOD GAS, VENOUS - Abnormal; Notable for the following:    pCO2, Ven 43.7 (*)    Acid-Base Excess 3.5 (*)    All other components within normal limits  CBC WITH DIFFERENTIAL/PLATELET - Abnormal; Notable for the following:    Hemoglobin 10.2 (*)    HCT 31.9 (*)    MCH 25.9 (*)  All other components within normal limits  COMPREHENSIVE METABOLIC PANEL - Abnormal; Notable for the following:    Sodium 134 (*)    Potassium 5.2 (*)    Chloride 99 (*)    Glucose, Bld 161 (*)    All other components within normal limits  URINE MICROSCOPIC-ADD ON - Abnormal; Notable for the following:    Squamous Epithelial / LPF 0-5 (*)    Bacteria, UA MANY (*)    All other components within normal limits  CBG MONITORING, ED - Abnormal; Notable for the following:    Glucose-Capillary 187 (*)    All other components within normal limits  URINE CULTURE  PROTIME-INR    EKG  EKG Interpretation None       Radiology No results found.  Procedures Procedures (including critical care time)  Medications Ordered in ED Medications - No data to display   Initial Impression / Assessment and Plan / ED Course  I have reviewed the triage vital signs and the nursing notes.  Pertinent labs & imaging results that were available during my care of the patient were reviewed by me and considered in my medical decision making (see chart for details).  Clinical Course   55 year old female here with hyperglycemia.  I called and spoke with patient's nurse at [facility] who was with her over the weekend.  States she has been non-compliant with diet, sugars running high.  When she goes on outing with her sisters she is coming back with sweets (candy bars, little debbie cakes, etc).  States for the past 2 days CBG  meter has been reading "high".  States she has been very thirsty and drinking lots of water.  Not aware that any extra insulin was given prior to coming here as patient was already en route to ED when she came on shift.  Here patient is awake, alert, oriented to her apparent baseline.  Does complain of some pain in her legs.  Seems to be a chronic issue. No signs of swelling, skin discoloration, or warmth to touch on exam.  She is afebrile, non-toxic.  CBG here 187.  Lab work is overall reassuring-- K+ is 5.2, hemolysis noted.  U/a does appear infectious with many bacteria, TNTC WBC's.  Culture pending.  Will start on keflex.  Patient has remained at her baseline.  Will d/c back to facility.  Discussed plan with patient, she acknowledged understanding and agreed with plan of care.  Return precautions given for new or worsening symptoms.  Case discussed with attending physician, Dr. Zenia Resides, who evaluated patient and agrees with assessment and plan of care.  Final Clinical Impressions(s) / ED Diagnoses   Final diagnoses:  Hyperglycemia  UTI (lower urinary tract infection)    New Prescriptions New Prescriptions   CEPHALEXIN (KEFLEX) 500 MG CAPSULE    Take 1 capsule (500 mg total) by mouth 2 (two) times daily.     Larene Pickett, PA-C 07/31/16 2055

## 2016-07-31 NOTE — ED Provider Notes (Signed)
Medical screening examination/treatment/procedure(s) were conducted as a shared visit with non-physician practitioner(s) and myself.  I personally evaluated the patient during the encounter  Patient from nursing home with hyperglycemia. Does note some chronic leg pain. Denies any vomiting at this time. Blood sugar noted here on arrival. Will rule out DKA.   Lorre Nick, MD 07/31/16 1758

## 2016-08-01 DIAGNOSIS — N390 Urinary tract infection, site not specified: Secondary | ICD-10-CM | POA: Diagnosis not present

## 2016-08-01 DIAGNOSIS — E109 Type 1 diabetes mellitus without complications: Secondary | ICD-10-CM | POA: Diagnosis not present

## 2016-08-01 DIAGNOSIS — Z952 Presence of prosthetic heart valve: Secondary | ICD-10-CM | POA: Diagnosis not present

## 2016-08-01 DIAGNOSIS — R739 Hyperglycemia, unspecified: Secondary | ICD-10-CM | POA: Diagnosis not present

## 2016-08-02 DIAGNOSIS — E785 Hyperlipidemia, unspecified: Secondary | ICD-10-CM | POA: Diagnosis not present

## 2016-08-02 DIAGNOSIS — M6281 Muscle weakness (generalized): Secondary | ICD-10-CM | POA: Diagnosis not present

## 2016-08-02 DIAGNOSIS — Z5181 Encounter for therapeutic drug level monitoring: Secondary | ICD-10-CM | POA: Diagnosis not present

## 2016-08-02 DIAGNOSIS — I5042 Chronic combined systolic (congestive) and diastolic (congestive) heart failure: Secondary | ICD-10-CM | POA: Diagnosis not present

## 2016-08-02 DIAGNOSIS — E1069 Type 1 diabetes mellitus with other specified complication: Secondary | ICD-10-CM | POA: Diagnosis not present

## 2016-08-02 DIAGNOSIS — R2689 Other abnormalities of gait and mobility: Secondary | ICD-10-CM | POA: Diagnosis not present

## 2016-08-02 DIAGNOSIS — I10 Essential (primary) hypertension: Secondary | ICD-10-CM | POA: Diagnosis not present

## 2016-08-02 DIAGNOSIS — R278 Other lack of coordination: Secondary | ICD-10-CM | POA: Diagnosis not present

## 2016-08-02 DIAGNOSIS — K219 Gastro-esophageal reflux disease without esophagitis: Secondary | ICD-10-CM | POA: Diagnosis not present

## 2016-08-02 LAB — URINE CULTURE

## 2016-08-03 DIAGNOSIS — E1065 Type 1 diabetes mellitus with hyperglycemia: Secondary | ICD-10-CM | POA: Diagnosis not present

## 2016-08-03 DIAGNOSIS — Z5181 Encounter for therapeutic drug level monitoring: Secondary | ICD-10-CM | POA: Diagnosis not present

## 2016-08-03 DIAGNOSIS — E1010 Type 1 diabetes mellitus with ketoacidosis without coma: Secondary | ICD-10-CM | POA: Diagnosis not present

## 2016-08-03 DIAGNOSIS — I5032 Chronic diastolic (congestive) heart failure: Secondary | ICD-10-CM | POA: Diagnosis not present

## 2016-08-03 DIAGNOSIS — R2689 Other abnormalities of gait and mobility: Secondary | ICD-10-CM | POA: Diagnosis not present

## 2016-08-03 DIAGNOSIS — E785 Hyperlipidemia, unspecified: Secondary | ICD-10-CM | POA: Diagnosis not present

## 2016-08-03 DIAGNOSIS — M6281 Muscle weakness (generalized): Secondary | ICD-10-CM | POA: Diagnosis not present

## 2016-08-03 DIAGNOSIS — I5042 Chronic combined systolic (congestive) and diastolic (congestive) heart failure: Secondary | ICD-10-CM | POA: Diagnosis not present

## 2016-08-03 DIAGNOSIS — E1069 Type 1 diabetes mellitus with other specified complication: Secondary | ICD-10-CM | POA: Diagnosis not present

## 2016-08-03 DIAGNOSIS — K219 Gastro-esophageal reflux disease without esophagitis: Secondary | ICD-10-CM | POA: Diagnosis not present

## 2016-08-03 DIAGNOSIS — R278 Other lack of coordination: Secondary | ICD-10-CM | POA: Diagnosis not present

## 2016-08-03 DIAGNOSIS — I10 Essential (primary) hypertension: Secondary | ICD-10-CM | POA: Diagnosis not present

## 2016-08-03 DIAGNOSIS — Z952 Presence of prosthetic heart valve: Secondary | ICD-10-CM | POA: Diagnosis not present

## 2016-08-05 ENCOUNTER — Emergency Department (HOSPITAL_COMMUNITY)
Admission: EM | Admit: 2016-08-05 | Discharge: 2016-08-06 | Disposition: A | Discharge status: Home / Self Care | Payer: Commercial Managed Care - HMO | Attending: Emergency Medicine | Admitting: Emergency Medicine

## 2016-08-05 ENCOUNTER — Encounter (HOSPITAL_COMMUNITY): Payer: Self-pay | Admitting: Emergency Medicine

## 2016-08-05 DIAGNOSIS — Z7901 Long term (current) use of anticoagulants: Secondary | ICD-10-CM | POA: Diagnosis not present

## 2016-08-05 DIAGNOSIS — I10 Essential (primary) hypertension: Secondary | ICD-10-CM | POA: Diagnosis not present

## 2016-08-05 DIAGNOSIS — Z794 Long term (current) use of insulin: Secondary | ICD-10-CM | POA: Insufficient documentation

## 2016-08-05 DIAGNOSIS — I2510 Atherosclerotic heart disease of native coronary artery without angina pectoris: Secondary | ICD-10-CM | POA: Insufficient documentation

## 2016-08-05 DIAGNOSIS — M6281 Muscle weakness (generalized): Secondary | ICD-10-CM | POA: Diagnosis not present

## 2016-08-05 DIAGNOSIS — R7309 Other abnormal glucose: Secondary | ICD-10-CM | POA: Diagnosis not present

## 2016-08-05 DIAGNOSIS — E1165 Type 2 diabetes mellitus with hyperglycemia: Secondary | ICD-10-CM | POA: Diagnosis not present

## 2016-08-05 DIAGNOSIS — E1065 Type 1 diabetes mellitus with hyperglycemia: Secondary | ICD-10-CM | POA: Diagnosis not present

## 2016-08-05 DIAGNOSIS — R2689 Other abnormalities of gait and mobility: Secondary | ICD-10-CM | POA: Diagnosis not present

## 2016-08-05 DIAGNOSIS — E1069 Type 1 diabetes mellitus with other specified complication: Secondary | ICD-10-CM | POA: Diagnosis not present

## 2016-08-05 DIAGNOSIS — Z5181 Encounter for therapeutic drug level monitoring: Secondary | ICD-10-CM | POA: Diagnosis not present

## 2016-08-05 DIAGNOSIS — E785 Hyperlipidemia, unspecified: Secondary | ICD-10-CM | POA: Diagnosis not present

## 2016-08-05 DIAGNOSIS — K219 Gastro-esophageal reflux disease without esophagitis: Secondary | ICD-10-CM | POA: Diagnosis not present

## 2016-08-05 DIAGNOSIS — I110 Hypertensive heart disease with heart failure: Secondary | ICD-10-CM | POA: Insufficient documentation

## 2016-08-05 DIAGNOSIS — Z79899 Other long term (current) drug therapy: Secondary | ICD-10-CM | POA: Insufficient documentation

## 2016-08-05 DIAGNOSIS — R739 Hyperglycemia, unspecified: Secondary | ICD-10-CM

## 2016-08-05 DIAGNOSIS — I5042 Chronic combined systolic (congestive) and diastolic (congestive) heart failure: Secondary | ICD-10-CM | POA: Diagnosis not present

## 2016-08-05 DIAGNOSIS — R278 Other lack of coordination: Secondary | ICD-10-CM | POA: Diagnosis not present

## 2016-08-05 LAB — URINALYSIS, ROUTINE W REFLEX MICROSCOPIC
BILIRUBIN URINE: NEGATIVE
Glucose, UA: >1000 mg/dL — AB
Hgb urine dipstick: NEGATIVE
KETONES UR: NEGATIVE mg/dL
Leukocytes, UA: NEGATIVE
NITRITE: NEGATIVE
PROTEIN: NEGATIVE mg/dL
SPECIFIC GRAVITY, URINE: 1.021 (ref 1.005–1.030)
pH: 6 (ref 5.0–8.0)

## 2016-08-05 LAB — COMPREHENSIVE METABOLIC PANEL
ALBUMIN: 3.4 g/dL — AB (ref 3.5–5.0)
ALK PHOS: 96 U/L (ref 38–126)
ALT: 26 U/L (ref 14–54)
ANION GAP: 13 (ref 5–15)
AST: 29 U/L (ref 15–41)
BUN: 28 mg/dL — ABNORMAL HIGH (ref 6–20)
CALCIUM: 9.3 mg/dL (ref 8.9–10.3)
CHLORIDE: 92 mmol/L — AB (ref 101–111)
CO2: 24 mmol/L (ref 22–32)
Creatinine, Ser: 1.05 mg/dL — ABNORMAL HIGH (ref 0.44–1.00)
GFR calc non Af Amer: 59 mL/min — ABNORMAL LOW (ref >60)
Glucose, Bld: 434 mg/dL — ABNORMAL HIGH (ref 65–99)
POTASSIUM: 4.4 mmol/L (ref 3.5–5.1)
SODIUM: 129 mmol/L — AB (ref 135–145)
Total Bilirubin: 0.3 mg/dL (ref 0.3–1.2)
Total Protein: 6.8 g/dL (ref 6.5–8.1)

## 2016-08-05 LAB — CBC WITH DIFFERENTIAL/PLATELET
BASOS PCT: 1 %
Basophils Absolute: 0 10*3/uL (ref 0.0–0.1)
EOS ABS: 0.1 10*3/uL (ref 0.0–0.7)
Eosinophils Relative: 1 %
HCT: 27.5 % — ABNORMAL LOW (ref 36.0–46.0)
HEMOGLOBIN: 9.1 g/dL — AB (ref 12.0–15.0)
Lymphocytes Relative: 44 %
Lymphs Abs: 2.8 10*3/uL (ref 0.7–4.0)
MCH: 25.7 pg — ABNORMAL LOW (ref 26.0–34.0)
MCHC: 33.1 g/dL (ref 30.0–36.0)
MCV: 77.7 fL — ABNORMAL LOW (ref 78.0–100.0)
MONOS PCT: 8 %
Monocytes Absolute: 0.5 10*3/uL (ref 0.1–1.0)
NEUTROS ABS: 2.9 10*3/uL (ref 1.7–7.7)
NEUTROS PCT: 46 %
PLATELETS: 303 10*3/uL (ref 150–400)
RBC: 3.54 MIL/uL — ABNORMAL LOW (ref 3.87–5.11)
RDW: 14.8 % (ref 11.5–15.5)
WBC: 6.3 10*3/uL (ref 4.0–10.5)

## 2016-08-05 LAB — URINE MICROSCOPIC-ADD ON: RBC / HPF: NONE SEEN RBC/hpf (ref 0–5)

## 2016-08-05 LAB — BLOOD GAS, VENOUS
ACID-BASE EXCESS: 1.5 mmol/L (ref 0.0–2.0)
BICARBONATE: 25.5 mmol/L (ref 20.0–28.0)
FIO2: 0.21
O2 SAT: 87.1 %
PATIENT TEMPERATURE: 98.8
pCO2, Ven: 39.8 mmHg — ABNORMAL LOW (ref 44.0–60.0)
pH, Ven: 7.423 (ref 7.250–7.430)
pO2, Ven: 56.6 mmHg — ABNORMAL HIGH (ref 32.0–45.0)

## 2016-08-05 LAB — CBG MONITORING, ED: GLUCOSE-CAPILLARY: 401 mg/dL — AB (ref 65–99)

## 2016-08-05 LAB — I-STAT TROPONIN, ED: TROPONIN I, POC: 0 ng/mL (ref 0.00–0.08)

## 2016-08-05 MED ORDER — INSULIN DETEMIR 100 UNIT/ML ~~LOC~~ SOLN
10.0000 [IU] | Freq: Once | SUBCUTANEOUS | Status: AC
  Administered 2016-08-05: 10 [IU] via SUBCUTANEOUS
  Filled 2016-08-05: qty 0.1

## 2016-08-05 MED ORDER — SODIUM CHLORIDE 0.9 % IV SOLN
1000.0000 mL | Freq: Once | INTRAVENOUS | Status: AC
  Administered 2016-08-05: 1000 mL via INTRAVENOUS

## 2016-08-05 MED ORDER — INSULIN ASPART 100 UNIT/ML ~~LOC~~ SOLN
8.0000 [IU] | Freq: Once | SUBCUTANEOUS | Status: AC
  Administered 2016-08-05: 8 [IU] via SUBCUTANEOUS
  Filled 2016-08-05: qty 1

## 2016-08-05 MED ORDER — SODIUM CHLORIDE 0.9 % IV SOLN
1000.0000 mL | INTRAVENOUS | Status: DC
  Administered 2016-08-05: 1000 mL via INTRAVENOUS

## 2016-08-05 NOTE — ED Notes (Signed)
Pt's CBG = 401 at this time.

## 2016-08-05 NOTE — ED Triage Notes (Addendum)
Brought in by EMS from [facility] nursing home facility with c/o hyperglycemia.  Pt reported that her CBG has been reading "HI" on the glucometer all day---- MD at the facility wanted emergent evaluation and treatment.  Pt's CBG was 470 by EMS.  Pt arrived to ED A/Ox4, no s/s distress noted.

## 2016-08-06 DIAGNOSIS — E118 Type 2 diabetes mellitus with unspecified complications: Secondary | ICD-10-CM | POA: Diagnosis not present

## 2016-08-06 DIAGNOSIS — E1365 Other specified diabetes mellitus with hyperglycemia: Secondary | ICD-10-CM | POA: Diagnosis not present

## 2016-08-06 LAB — CBG MONITORING, ED: GLUCOSE-CAPILLARY: 219 mg/dL — AB (ref 65–99)

## 2016-08-06 NOTE — ED Notes (Signed)
[facility] here to transport pt back to [facility] facility. 

## 2016-08-06 NOTE — ED Provider Notes (Signed)
[facility]-EMERGENCY DEPT Provider Note   CSN: [id number] Arrival date & time: 08/05/16  2051     History   Chief Complaint Chief Complaint  Patient presents with  . Hyperglycemia    HPI Madison [name] is a 55 y.o. female.  HPI Patient reports she had a high blood sugar reading at her nursing home. She states she feels fine and doesn't  know why she had to come to the hospital. She states her doctor told her that sometimes her blood sugar was high and sometimes it goes low and that that is just how her body Works. Past Medical History:  Diagnosis Date  . Allergy   . Anxiety   . Aortic stenosis   . CAD (coronary artery disease)   . CHF (congestive heart failure) (HCC)   . Depression   . Diabetes (HCC) dx'd 1982   "went straight to insulin"  . Dyslipidemia   . GERD (gastroesophageal reflux disease)   . Heart murmur   . HTN (hypertension)   . Hypercholesteremia   . Myocardial infarction (HCC) 12/2014  . Obesity   . Pneumonia 11/2014; 12/2014  . Rheumatoid arthritis(714.0)    "hands" (07/01/2015)  . Stroke syndrome [facility]) 1995; 2001   "when my son was born; problems w/speech and L hand since then" (01/19/2015)  . Thrombophlebitis     Patient Active Problem List   Diagnosis Date Noted  . Left ear pain 07/01/2016  . Hyperglycemia 06/06/2016  . Malnutrition of moderate degree 06/04/2016  . Septic shock (HCC) 06/04/2016  . SIRS (systemic inflammatory response syndrome) (HCC) 05/25/2016  . Type 1 diabetes mellitus with ketoacidosis without coma (HCC)   . MDD (major depressive disorder), recurrent episode, moderate (HCC)   . Aspiration pneumonia (HCC) 05/17/2016  . Diabetic ketoacidosis without coma (HCC)   . Sepsis (HCC)   . 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 MSOF (multiple systems organ failure) 05/01/2016  . Acute hypoxemic respiratory failure (HCC)   . Type I diabetes mellitus with complication, uncontrolled (HCC) 03/29/2016  . Adjustment disorder with mixed  anxiety and depressed mood 03/28/2016  . HLD (hyperlipidemia) 03/27/2016  . AKI (acute kidney injury) (HCC) 03/27/2016  . Chronic combined systolic and diastolic congestive heart failure (HCC)   . Diabetic ketoacidosis (HCC) 03/26/2016  . Type 1 diabetes, HbA1c goal < 7% (HCC)   . Hypotension 03/08/2016  . Uncontrolled type 1 diabetes mellitus with ketoacidotic coma (HCC)   . Acute encephalopathy 02/28/2016  . Hyponatremia: Pseudo 02/28/2016  . Lactic acidosis   . Diabetic ketoacidosis with coma associated with type 1 diabetes mellitus (HCC)   . Hypoglycemia   . Chronic diastolic heart failure (HCC)   . Esophageal reflux   . S/P AVR (aortic valve replacement)   . ARF (acute renal failure) (HCC) 01/22/2016  . CHF (congestive heart failure) (HCC) 01/22/2016  . Diabetic ketoacidosis without coma associated with type 1 diabetes mellitus (HCC)   . DKA, type 1 (HCC) 11/23/2015  . Constipation 11/23/2015  . Leukocytosis   . Pressure ulcer 08/02/2015  . Diabetic ketoacidosis with coma associated with other specified diabetes mellitus (HCC)   . Closed left ankle fracture 07/01/2015  . Trimalleolar fracture of left ankle 07/01/2015  . Hyperkalemia   . Insomnia 06/16/2015  . Normocytic anemia 01/31/2015  . Acute on chronic combined systolic and diastolic CHF (congestive heart failure) (HCC)   . Essential hypertension   . Elevated troponin   . Diabetes type 1, uncontrolled (HCC)   .  Demand ischemia (HCC)   . S/P aortic valve replacement   . Hypokalemia   . NSTEMI (non-ST elevated myocardial infarction) (HCC) 12/08/2014  . Blood poisoning (HCC)   . Supratherapeutic INR   . Severe sepsis with acute organ dysfunction (HCC) 12/03/2014  . Spastic hemiplegia affecting nondominant side (HCC) 11/29/2014  . Dysphagia, pharyngoesophageal phase 10/04/2014  . Sinus tachycardia (HCC) 09/30/2014  . Depression 04/29/2014  . CVA (cerebral infarction) 03/12/2014  . Acute respiratory failure (HCC)  03/12/2014  . Chronic anticoagulation 07/28/2013  . Long term (current) use of anticoagulants 07/28/2013  . CAD (coronary artery disease)   . Aortic stenosis   . Hyperlipidemia   . Rheumatoid arthritis [facility])     Past Surgical History:  Procedure Laterality Date  . AORTIC VALVE REPLACEMENT (AVR)/CORONARY ARTERY BYPASS GRAFTING (CABG)  "2014"   [name] 03/08/2014  . CARDIAC CATHETERIZATION  12/08/2014  . CATARACT EXTRACTION Left   . CESAREAN SECTION  1995  . CORONARY ANGIOPLASTY WITH STENT PLACEMENT  12/09/2014  . CORONARY ARTERY BYPASS GRAFT    . FOOT FRACTURE SURGERY    . FRACTURE SURGERY    . KNEE ARTHROSCOPY Right   . LEFT HEART CATHETERIZATION WITH CORONARY ANGIOGRAM N/A 12/08/2014   Procedure: LEFT HEART CATHETERIZATION WITH CORONARY ANGIOGRAM;  Surgeon: Iran Ouch, MD;  Location: [facility] CATH LAB;  Service: Cardiovascular;  Laterality: N/A;  . NEUROPLASTY / TRANSPOSITION MEDIAN NERVE AT CARPAL TUNNEL    . ORIF ANKLE FRACTURE Left 07/04/2015   Procedure: OPEN REDUCTION INTERNAL FIXATION (ORIF)  TRIMAL ANKLE FRACTURE;  Surgeon: [name], MD;  Location: [facility] OR;  Service: Orthopedics;  Laterality: Left;  . PERCUTANEOUS CORONARY STENT INTERVENTION (PCI-S) N/A 12/09/2014   Procedure: PERCUTANEOUS CORONARY STENT INTERVENTION (PCI-S);  Surgeon: [name], MD;  Location: [facility] CATH LAB;  Service: Cardiovascular;  Laterality: N/A;  . TUBAL LIGATION  1995    OB History    No data available       Home Medications    Prior to Admission medications   Medication Sig Start Date End Date Taking? Authorizing Provider  cephALEXin (KEFLEX) 500 MG capsule Take 1 capsule (500 mg total) by mouth 2 (two) times daily. 07/31/16  Yes Garlon Hatchet, PA-C  citalopram (CELEXA) 40 MG tablet Take 1 tablet (40 mg total) by mouth daily. 03/08/16  Yes [name], NP  docusate sodium (COLACE) 100 MG capsule Take 100 mg by mouth 2 (two) times daily.   Yes Historical Provider, MD  enoxaparin (LOVENOX)  40 MG/0.4ML injection Inject 40 mg into the skin daily.   Yes Historical Provider, MD  ergocalciferol (VITAMIN D2) 50000 units capsule Take 50,000 Units by mouth once a week. Pt takes on Wednesday.   Yes Historical Provider, MD  furosemide (LASIX) 20 MG tablet Take 1 tablet (20 mg total) by mouth daily. 05/22/16  Yes Zannie Cove, MD  Insulin Detemir (LEVEMIR FLEXTOUCH) 100 UNIT/ML Pen Inject 10 Units into the skin 2 (two) times daily.   Yes Historical Provider, MD  Melatonin 3 MG TABS Take 3 mg by mouth at bedtime as needed (for sleep).   Yes Historical Provider, MD  neomycin-polymyxin-hydrocortisone (CORTISPORIN) 3.5-10000-1 otic suspension Place 3 drops into the left ear every 6 (six) hours.   Yes Historical Provider, MD  Nutritional Supplements (NUTRITIONAL DRINK PO) Take 120 mLs by mouth 3 (three) times daily.    Yes Historical Provider, MD  omeprazole (PRILOSEC) 40 MG capsule Take 40 mg by mouth daily before breakfast.  Yes Historical Provider, MD  pravastatin (PRAVACHOL) 20 MG tablet Take 20 mg by mouth every evening.   Yes Historical Provider, MD  traMADol (ULTRAM) 50 MG tablet Take 50 mg by mouth every 6 (six) hours as needed for moderate pain or severe pain.   Yes Historical Provider, MD  warfarin (COUMADIN) 10 MG tablet Take 10 mg by mouth daily.   Yes Historical Provider, MD  warfarin (COUMADIN) 5 MG tablet Take 5 mg by mouth every evening.    Historical Provider, MD    Family History Family History  Problem Relation Age of Onset  . Heart disease Mother   . Heart disease Sister   . Stroke      Social History Social History  Substance Use Topics  . Smoking status: Never Smoker  . Smokeless tobacco: Never Used  . Alcohol use No     Allergies   Adhesive [tape]; Celebrex [celecoxib]; and Detrol [tolterodine]   Review of Systems Review of Systems 10 Systems reviewed and are negative for acute change except as noted in the HPI.   Physical Exam Updated Vital Signs BP  (!) 109/54   Pulse 77   Temp 98.8 F (37.1 C) (Oral)   Resp 17   Ht 5\' 3"  (1.6 m)   Wt 121 lb (54.9 kg)   SpO2 97%   BMI 21.43 kg/m   Physical Exam  Constitutional: She appears well-developed and well-nourished. No distress.  HENT:  Head: Normocephalic and atraumatic.  Eyes: Conjunctivae are normal.  Neck: Neck supple.  Cardiovascular: Normal rate and regular rhythm.   No murmur heard. Pulmonary/Chest: Effort normal and breath sounds normal. No respiratory distress.  Abdominal: Soft. There is no tenderness.  Musculoskeletal: She exhibits no edema.  Neurological: She is alert. A cranial nerve deficit is present. She exhibits abnormal muscle tone.  Patient does have slightly slurred speech and weakness on the left side from prior stroke.  Skin: Skin is warm and dry.  Psychiatric: She has a normal mood and affect.  Nursing note and vitals reviewed.    ED Treatments / Results  Labs (all labs ordered are listed, but only abnormal results are displayed) Labs Reviewed  COMPREHENSIVE METABOLIC PANEL - Abnormal; Notable for the following:       Result Value   Sodium 129 (*)    Chloride 92 (*)    Glucose, Bld 434 (*)    BUN 28 (*)    Creatinine, Ser 1.05 (*)    Albumin 3.4 (*)    GFR calc non Af Amer 59 (*)    All other components within normal limits  CBC WITH DIFFERENTIAL/PLATELET - Abnormal; Notable for the following:    RBC 3.54 (*)    Hemoglobin 9.1 (*)    HCT 27.5 (*)    MCV 77.7 (*)    MCH 25.7 (*)    All other components within normal limits  URINALYSIS, ROUTINE W REFLEX MICROSCOPIC (NOT AT [facility]) - Abnormal; Notable for the following:    Glucose, UA >1000 (*)    All other components within normal limits  BLOOD GAS, VENOUS - Abnormal; Notable for the following:    pCO2, Ven 39.8 (*)    pO2, Ven 56.6 (*)    All other components within normal limits  URINE MICROSCOPIC-ADD ON - Abnormal; Notable for the following:    Squamous Epithelial / LPF 0-5 (*)    Bacteria,  UA RARE (*)    All other components within normal limits  CBG MONITORING,  ED - Abnormal; Notable for the following:    Glucose-Capillary 401 (*)    All other components within normal limits  CBG MONITORING, ED - Abnormal; Notable for the following:    Glucose-Capillary 219 (*)    All other components within normal limits  I-STAT TROPOININ, ED    EKG  EKG Interpretation None       Radiology No results found.  Procedures Procedures (including critical care time)  Medications Ordered in ED Medications  0.9 %  sodium chloride infusion (1,000 mLs Intravenous New Bag/Given 08/05/16 2306)    Followed by  0.9 %  sodium chloride infusion (1,000 mLs Intravenous New Bag/Given 08/05/16 2306)  insulin aspart (novoLOG) injection 8 Units (8 Units Subcutaneous Given 08/05/16 2305)  insulin detemir (LEVEMIR) injection 10 Units (10 Units Subcutaneous Given 08/05/16 2339)     Initial Impression / Assessment and Plan / ED Course  I have reviewed the triage vital signs and the nursing notes.  Pertinent labs & imaging results that were available during my care of the patient were reviewed by me and considered in my medical decision making (see chart for details).  Clinical Course    Final Clinical Impressions(s) / ED Diagnoses   Final diagnoses:  Hyperglycemia  Patient does not show signs of acidosis. She is alert and appropriate. Patient has been given Levemir and regular insulin with improvement in CBG. Repeat blood sugar is now 219. At this point I feel she is safe to return to her nursing home for ongoing care. She shows no signs of infectious illness or chest pain or change in neurologic symptoms.  New Prescriptions New Prescriptions   No medications on file     Arby Barrette, MD 08/06/16 0100

## 2016-08-06 NOTE — ED Notes (Signed)
Pt's primary nurse at [facility] was called and given report on pt and discharge instructions.

## 2016-08-06 NOTE — Discharge Instructions (Signed)
Patient was given her evening Levemir dose in the emergency department. She was also given 8 units of regular insulin. Patient does not have signs of hyperosmolar hyperglycemic nonketotic syndrome. At this time, continue her

## 2016-08-07 DIAGNOSIS — E785 Hyperlipidemia, unspecified: Secondary | ICD-10-CM | POA: Diagnosis not present

## 2016-08-07 DIAGNOSIS — K219 Gastro-esophageal reflux disease without esophagitis: Secondary | ICD-10-CM | POA: Diagnosis not present

## 2016-08-07 DIAGNOSIS — E1069 Type 1 diabetes mellitus with other specified complication: Secondary | ICD-10-CM | POA: Diagnosis not present

## 2016-08-07 DIAGNOSIS — I5042 Chronic combined systolic (congestive) and diastolic (congestive) heart failure: Secondary | ICD-10-CM | POA: Diagnosis not present

## 2016-08-07 DIAGNOSIS — R278 Other lack of coordination: Secondary | ICD-10-CM | POA: Diagnosis not present

## 2016-08-07 DIAGNOSIS — M6281 Muscle weakness (generalized): Secondary | ICD-10-CM | POA: Diagnosis not present

## 2016-08-07 DIAGNOSIS — Z5181 Encounter for therapeutic drug level monitoring: Secondary | ICD-10-CM | POA: Diagnosis not present

## 2016-08-07 DIAGNOSIS — R2689 Other abnormalities of gait and mobility: Secondary | ICD-10-CM | POA: Diagnosis not present

## 2016-08-07 DIAGNOSIS — I10 Essential (primary) hypertension: Secondary | ICD-10-CM | POA: Diagnosis not present

## 2016-08-09 DIAGNOSIS — N179 Acute kidney failure, unspecified: Secondary | ICD-10-CM | POA: Diagnosis not present

## 2016-08-09 DIAGNOSIS — E784 Other hyperlipidemia: Secondary | ICD-10-CM | POA: Diagnosis not present

## 2016-08-09 DIAGNOSIS — I5041 Acute combined systolic (congestive) and diastolic (congestive) heart failure: Secondary | ICD-10-CM | POA: Diagnosis not present

## 2016-08-09 DIAGNOSIS — Z952 Presence of prosthetic heart valve: Secondary | ICD-10-CM | POA: Diagnosis not present

## 2016-08-09 DIAGNOSIS — K219 Gastro-esophageal reflux disease without esophagitis: Secondary | ICD-10-CM | POA: Diagnosis not present

## 2016-08-09 DIAGNOSIS — I10 Essential (primary) hypertension: Secondary | ICD-10-CM | POA: Diagnosis not present

## 2016-08-09 DIAGNOSIS — R278 Other lack of coordination: Secondary | ICD-10-CM | POA: Diagnosis not present

## 2016-08-09 DIAGNOSIS — Z5181 Encounter for therapeutic drug level monitoring: Secondary | ICD-10-CM | POA: Diagnosis not present

## 2016-08-09 DIAGNOSIS — M6281 Muscle weakness (generalized): Secondary | ICD-10-CM | POA: Diagnosis not present

## 2016-08-09 DIAGNOSIS — I5042 Chronic combined systolic (congestive) and diastolic (congestive) heart failure: Secondary | ICD-10-CM | POA: Diagnosis not present

## 2016-08-09 DIAGNOSIS — G4709 Other insomnia: Secondary | ICD-10-CM | POA: Diagnosis not present

## 2016-08-09 DIAGNOSIS — R2689 Other abnormalities of gait and mobility: Secondary | ICD-10-CM | POA: Diagnosis not present

## 2016-08-09 DIAGNOSIS — F4323 Adjustment disorder with mixed anxiety and depressed mood: Secondary | ICD-10-CM | POA: Diagnosis not present

## 2016-08-09 DIAGNOSIS — D6489 Other specified anemias: Secondary | ICD-10-CM | POA: Diagnosis not present

## 2016-08-09 DIAGNOSIS — E1069 Type 1 diabetes mellitus with other specified complication: Secondary | ICD-10-CM | POA: Diagnosis not present

## 2016-08-09 DIAGNOSIS — E785 Hyperlipidemia, unspecified: Secondary | ICD-10-CM | POA: Diagnosis not present

## 2016-08-09 DIAGNOSIS — E1010 Type 1 diabetes mellitus with ketoacidosis without coma: Secondary | ICD-10-CM | POA: Diagnosis not present

## 2016-08-11 DIAGNOSIS — I10 Essential (primary) hypertension: Secondary | ICD-10-CM | POA: Diagnosis not present

## 2016-08-11 DIAGNOSIS — E1069 Type 1 diabetes mellitus with other specified complication: Secondary | ICD-10-CM | POA: Diagnosis not present

## 2016-08-11 DIAGNOSIS — K219 Gastro-esophageal reflux disease without esophagitis: Secondary | ICD-10-CM | POA: Diagnosis not present

## 2016-08-11 DIAGNOSIS — Z5181 Encounter for therapeutic drug level monitoring: Secondary | ICD-10-CM | POA: Diagnosis not present

## 2016-08-11 DIAGNOSIS — M6281 Muscle weakness (generalized): Secondary | ICD-10-CM | POA: Diagnosis not present

## 2016-08-11 DIAGNOSIS — E785 Hyperlipidemia, unspecified: Secondary | ICD-10-CM | POA: Diagnosis not present

## 2016-08-11 DIAGNOSIS — I5042 Chronic combined systolic (congestive) and diastolic (congestive) heart failure: Secondary | ICD-10-CM | POA: Diagnosis not present

## 2016-08-11 DIAGNOSIS — R2689 Other abnormalities of gait and mobility: Secondary | ICD-10-CM | POA: Diagnosis not present

## 2016-08-11 DIAGNOSIS — R278 Other lack of coordination: Secondary | ICD-10-CM | POA: Diagnosis not present

## 2016-08-15 DIAGNOSIS — M6281 Muscle weakness (generalized): Secondary | ICD-10-CM | POA: Diagnosis not present

## 2016-08-15 DIAGNOSIS — Z5181 Encounter for therapeutic drug level monitoring: Secondary | ICD-10-CM | POA: Diagnosis not present

## 2016-08-15 DIAGNOSIS — R278 Other lack of coordination: Secondary | ICD-10-CM | POA: Diagnosis not present

## 2016-08-15 DIAGNOSIS — I10 Essential (primary) hypertension: Secondary | ICD-10-CM | POA: Diagnosis not present

## 2016-08-15 DIAGNOSIS — E1069 Type 1 diabetes mellitus with other specified complication: Secondary | ICD-10-CM | POA: Diagnosis not present

## 2016-08-15 DIAGNOSIS — K219 Gastro-esophageal reflux disease without esophagitis: Secondary | ICD-10-CM | POA: Diagnosis not present

## 2016-08-15 DIAGNOSIS — R2689 Other abnormalities of gait and mobility: Secondary | ICD-10-CM | POA: Diagnosis not present

## 2016-08-15 DIAGNOSIS — I5042 Chronic combined systolic (congestive) and diastolic (congestive) heart failure: Secondary | ICD-10-CM | POA: Diagnosis not present

## 2016-08-15 DIAGNOSIS — E785 Hyperlipidemia, unspecified: Secondary | ICD-10-CM | POA: Diagnosis not present

## 2016-08-16 DIAGNOSIS — I10 Essential (primary) hypertension: Secondary | ICD-10-CM | POA: Diagnosis not present

## 2016-08-16 DIAGNOSIS — E785 Hyperlipidemia, unspecified: Secondary | ICD-10-CM | POA: Diagnosis not present

## 2016-08-16 DIAGNOSIS — E1069 Type 1 diabetes mellitus with other specified complication: Secondary | ICD-10-CM | POA: Diagnosis not present

## 2016-08-16 DIAGNOSIS — R2689 Other abnormalities of gait and mobility: Secondary | ICD-10-CM | POA: Diagnosis not present

## 2016-08-16 DIAGNOSIS — R278 Other lack of coordination: Secondary | ICD-10-CM | POA: Diagnosis not present

## 2016-08-16 DIAGNOSIS — I5042 Chronic combined systolic (congestive) and diastolic (congestive) heart failure: Secondary | ICD-10-CM | POA: Diagnosis not present

## 2016-08-16 DIAGNOSIS — K219 Gastro-esophageal reflux disease without esophagitis: Secondary | ICD-10-CM | POA: Diagnosis not present

## 2016-08-16 DIAGNOSIS — Z5181 Encounter for therapeutic drug level monitoring: Secondary | ICD-10-CM | POA: Diagnosis not present

## 2016-08-16 DIAGNOSIS — M6281 Muscle weakness (generalized): Secondary | ICD-10-CM | POA: Diagnosis not present

## 2016-08-17 DIAGNOSIS — E1069 Type 1 diabetes mellitus with other specified complication: Secondary | ICD-10-CM | POA: Diagnosis not present

## 2016-08-17 DIAGNOSIS — M6281 Muscle weakness (generalized): Secondary | ICD-10-CM | POA: Diagnosis not present

## 2016-08-17 DIAGNOSIS — I5042 Chronic combined systolic (congestive) and diastolic (congestive) heart failure: Secondary | ICD-10-CM | POA: Diagnosis not present

## 2016-08-17 DIAGNOSIS — E108 Type 1 diabetes mellitus with unspecified complications: Secondary | ICD-10-CM | POA: Diagnosis not present

## 2016-08-17 DIAGNOSIS — R2689 Other abnormalities of gait and mobility: Secondary | ICD-10-CM | POA: Diagnosis not present

## 2016-08-17 DIAGNOSIS — I5032 Chronic diastolic (congestive) heart failure: Secondary | ICD-10-CM | POA: Diagnosis not present

## 2016-08-17 DIAGNOSIS — E785 Hyperlipidemia, unspecified: Secondary | ICD-10-CM | POA: Diagnosis not present

## 2016-08-17 DIAGNOSIS — R278 Other lack of coordination: Secondary | ICD-10-CM | POA: Diagnosis not present

## 2016-08-17 DIAGNOSIS — I10 Essential (primary) hypertension: Secondary | ICD-10-CM | POA: Diagnosis not present

## 2016-08-17 DIAGNOSIS — K219 Gastro-esophageal reflux disease without esophagitis: Secondary | ICD-10-CM | POA: Diagnosis not present

## 2016-08-17 DIAGNOSIS — Z5181 Encounter for therapeutic drug level monitoring: Secondary | ICD-10-CM | POA: Diagnosis not present

## 2016-08-17 DIAGNOSIS — Z952 Presence of prosthetic heart valve: Secondary | ICD-10-CM | POA: Diagnosis not present

## 2016-08-19 DIAGNOSIS — I5042 Chronic combined systolic (congestive) and diastolic (congestive) heart failure: Secondary | ICD-10-CM | POA: Diagnosis not present

## 2016-08-19 DIAGNOSIS — R278 Other lack of coordination: Secondary | ICD-10-CM | POA: Diagnosis not present

## 2016-08-19 DIAGNOSIS — R2689 Other abnormalities of gait and mobility: Secondary | ICD-10-CM | POA: Diagnosis not present

## 2016-08-19 DIAGNOSIS — E785 Hyperlipidemia, unspecified: Secondary | ICD-10-CM | POA: Diagnosis not present

## 2016-08-19 DIAGNOSIS — M6281 Muscle weakness (generalized): Secondary | ICD-10-CM | POA: Diagnosis not present

## 2016-08-19 DIAGNOSIS — E1069 Type 1 diabetes mellitus with other specified complication: Secondary | ICD-10-CM | POA: Diagnosis not present

## 2016-08-19 DIAGNOSIS — K219 Gastro-esophageal reflux disease without esophagitis: Secondary | ICD-10-CM | POA: Diagnosis not present

## 2016-08-19 DIAGNOSIS — Z5181 Encounter for therapeutic drug level monitoring: Secondary | ICD-10-CM | POA: Diagnosis not present

## 2016-08-19 DIAGNOSIS — I10 Essential (primary) hypertension: Secondary | ICD-10-CM | POA: Diagnosis not present

## 2016-08-20 DIAGNOSIS — R278 Other lack of coordination: Secondary | ICD-10-CM | POA: Diagnosis not present

## 2016-08-20 DIAGNOSIS — M6281 Muscle weakness (generalized): Secondary | ICD-10-CM | POA: Diagnosis not present

## 2016-08-20 DIAGNOSIS — Z5181 Encounter for therapeutic drug level monitoring: Secondary | ICD-10-CM | POA: Diagnosis not present

## 2016-08-20 DIAGNOSIS — E785 Hyperlipidemia, unspecified: Secondary | ICD-10-CM | POA: Diagnosis not present

## 2016-08-20 DIAGNOSIS — K219 Gastro-esophageal reflux disease without esophagitis: Secondary | ICD-10-CM | POA: Diagnosis not present

## 2016-08-20 DIAGNOSIS — I5042 Chronic combined systolic (congestive) and diastolic (congestive) heart failure: Secondary | ICD-10-CM | POA: Diagnosis not present

## 2016-08-20 DIAGNOSIS — I10 Essential (primary) hypertension: Secondary | ICD-10-CM | POA: Diagnosis not present

## 2016-08-20 DIAGNOSIS — E1069 Type 1 diabetes mellitus with other specified complication: Secondary | ICD-10-CM | POA: Diagnosis not present

## 2016-08-20 DIAGNOSIS — R2689 Other abnormalities of gait and mobility: Secondary | ICD-10-CM | POA: Diagnosis not present

## 2016-08-21 DIAGNOSIS — K219 Gastro-esophageal reflux disease without esophagitis: Secondary | ICD-10-CM | POA: Diagnosis not present

## 2016-08-21 DIAGNOSIS — M6281 Muscle weakness (generalized): Secondary | ICD-10-CM | POA: Diagnosis not present

## 2016-08-21 DIAGNOSIS — E1069 Type 1 diabetes mellitus with other specified complication: Secondary | ICD-10-CM | POA: Diagnosis not present

## 2016-08-21 DIAGNOSIS — R278 Other lack of coordination: Secondary | ICD-10-CM | POA: Diagnosis not present

## 2016-08-21 DIAGNOSIS — E785 Hyperlipidemia, unspecified: Secondary | ICD-10-CM | POA: Diagnosis not present

## 2016-08-21 DIAGNOSIS — I5042 Chronic combined systolic (congestive) and diastolic (congestive) heart failure: Secondary | ICD-10-CM | POA: Diagnosis not present

## 2016-08-21 DIAGNOSIS — Z5181 Encounter for therapeutic drug level monitoring: Secondary | ICD-10-CM | POA: Diagnosis not present

## 2016-08-21 DIAGNOSIS — R2689 Other abnormalities of gait and mobility: Secondary | ICD-10-CM | POA: Diagnosis not present

## 2016-08-21 DIAGNOSIS — I10 Essential (primary) hypertension: Secondary | ICD-10-CM | POA: Diagnosis not present

## 2016-08-22 DIAGNOSIS — Z5181 Encounter for therapeutic drug level monitoring: Secondary | ICD-10-CM | POA: Diagnosis not present

## 2016-08-22 DIAGNOSIS — M6281 Muscle weakness (generalized): Secondary | ICD-10-CM | POA: Diagnosis not present

## 2016-08-22 DIAGNOSIS — R278 Other lack of coordination: Secondary | ICD-10-CM | POA: Diagnosis not present

## 2016-08-22 DIAGNOSIS — I5042 Chronic combined systolic (congestive) and diastolic (congestive) heart failure: Secondary | ICD-10-CM | POA: Diagnosis not present

## 2016-08-22 DIAGNOSIS — I10 Essential (primary) hypertension: Secondary | ICD-10-CM | POA: Diagnosis not present

## 2016-08-22 DIAGNOSIS — E785 Hyperlipidemia, unspecified: Secondary | ICD-10-CM | POA: Diagnosis not present

## 2016-08-22 DIAGNOSIS — E1069 Type 1 diabetes mellitus with other specified complication: Secondary | ICD-10-CM | POA: Diagnosis not present

## 2016-08-22 DIAGNOSIS — K219 Gastro-esophageal reflux disease without esophagitis: Secondary | ICD-10-CM | POA: Diagnosis not present

## 2016-08-22 DIAGNOSIS — R2689 Other abnormalities of gait and mobility: Secondary | ICD-10-CM | POA: Diagnosis not present

## 2016-08-23 DIAGNOSIS — M6281 Muscle weakness (generalized): Secondary | ICD-10-CM | POA: Diagnosis not present

## 2016-08-23 DIAGNOSIS — Z5181 Encounter for therapeutic drug level monitoring: Secondary | ICD-10-CM | POA: Diagnosis not present

## 2016-08-23 DIAGNOSIS — I5042 Chronic combined systolic (congestive) and diastolic (congestive) heart failure: Secondary | ICD-10-CM | POA: Diagnosis not present

## 2016-08-23 DIAGNOSIS — K219 Gastro-esophageal reflux disease without esophagitis: Secondary | ICD-10-CM | POA: Diagnosis not present

## 2016-08-23 DIAGNOSIS — R278 Other lack of coordination: Secondary | ICD-10-CM | POA: Diagnosis not present

## 2016-08-23 DIAGNOSIS — I10 Essential (primary) hypertension: Secondary | ICD-10-CM | POA: Diagnosis not present

## 2016-08-23 DIAGNOSIS — R2689 Other abnormalities of gait and mobility: Secondary | ICD-10-CM | POA: Diagnosis not present

## 2016-08-23 DIAGNOSIS — E785 Hyperlipidemia, unspecified: Secondary | ICD-10-CM | POA: Diagnosis not present

## 2016-08-23 DIAGNOSIS — E1069 Type 1 diabetes mellitus with other specified complication: Secondary | ICD-10-CM | POA: Diagnosis not present

## 2016-08-24 DIAGNOSIS — M6281 Muscle weakness (generalized): Secondary | ICD-10-CM | POA: Diagnosis not present

## 2016-08-24 DIAGNOSIS — R278 Other lack of coordination: Secondary | ICD-10-CM | POA: Diagnosis not present

## 2016-08-24 DIAGNOSIS — E785 Hyperlipidemia, unspecified: Secondary | ICD-10-CM | POA: Diagnosis not present

## 2016-08-24 DIAGNOSIS — Z5181 Encounter for therapeutic drug level monitoring: Secondary | ICD-10-CM | POA: Diagnosis not present

## 2016-08-24 DIAGNOSIS — E1069 Type 1 diabetes mellitus with other specified complication: Secondary | ICD-10-CM | POA: Diagnosis not present

## 2016-08-24 DIAGNOSIS — I5042 Chronic combined systolic (congestive) and diastolic (congestive) heart failure: Secondary | ICD-10-CM | POA: Diagnosis not present

## 2016-08-24 DIAGNOSIS — K219 Gastro-esophageal reflux disease without esophagitis: Secondary | ICD-10-CM | POA: Diagnosis not present

## 2016-08-24 DIAGNOSIS — I10 Essential (primary) hypertension: Secondary | ICD-10-CM | POA: Diagnosis not present

## 2016-08-24 DIAGNOSIS — R2689 Other abnormalities of gait and mobility: Secondary | ICD-10-CM | POA: Diagnosis not present

## 2016-08-27 ENCOUNTER — Ambulatory Visit: Payer: Self-pay | Admitting: General Practice

## 2016-08-27 DIAGNOSIS — Z5181 Encounter for therapeutic drug level monitoring: Secondary | ICD-10-CM | POA: Diagnosis not present

## 2016-08-27 DIAGNOSIS — M6281 Muscle weakness (generalized): Secondary | ICD-10-CM | POA: Diagnosis not present

## 2016-08-27 DIAGNOSIS — R278 Other lack of coordination: Secondary | ICD-10-CM | POA: Diagnosis not present

## 2016-08-27 DIAGNOSIS — K219 Gastro-esophageal reflux disease without esophagitis: Secondary | ICD-10-CM | POA: Diagnosis not present

## 2016-08-27 DIAGNOSIS — I5042 Chronic combined systolic (congestive) and diastolic (congestive) heart failure: Secondary | ICD-10-CM | POA: Diagnosis not present

## 2016-08-27 DIAGNOSIS — R2689 Other abnormalities of gait and mobility: Secondary | ICD-10-CM | POA: Diagnosis not present

## 2016-08-27 DIAGNOSIS — E785 Hyperlipidemia, unspecified: Secondary | ICD-10-CM | POA: Diagnosis not present

## 2016-08-27 DIAGNOSIS — I10 Essential (primary) hypertension: Secondary | ICD-10-CM | POA: Diagnosis not present

## 2016-08-27 DIAGNOSIS — E1069 Type 1 diabetes mellitus with other specified complication: Secondary | ICD-10-CM | POA: Diagnosis not present

## 2016-08-28 DIAGNOSIS — E1069 Type 1 diabetes mellitus with other specified complication: Secondary | ICD-10-CM | POA: Diagnosis not present

## 2016-08-28 DIAGNOSIS — K219 Gastro-esophageal reflux disease without esophagitis: Secondary | ICD-10-CM | POA: Diagnosis not present

## 2016-08-28 DIAGNOSIS — R278 Other lack of coordination: Secondary | ICD-10-CM | POA: Diagnosis not present

## 2016-08-28 DIAGNOSIS — E785 Hyperlipidemia, unspecified: Secondary | ICD-10-CM | POA: Diagnosis not present

## 2016-08-28 DIAGNOSIS — Z5181 Encounter for therapeutic drug level monitoring: Secondary | ICD-10-CM | POA: Diagnosis not present

## 2016-08-28 DIAGNOSIS — R2689 Other abnormalities of gait and mobility: Secondary | ICD-10-CM | POA: Diagnosis not present

## 2016-08-28 DIAGNOSIS — I5042 Chronic combined systolic (congestive) and diastolic (congestive) heart failure: Secondary | ICD-10-CM | POA: Diagnosis not present

## 2016-08-28 DIAGNOSIS — M6281 Muscle weakness (generalized): Secondary | ICD-10-CM | POA: Diagnosis not present

## 2016-08-28 DIAGNOSIS — I10 Essential (primary) hypertension: Secondary | ICD-10-CM | POA: Diagnosis not present

## 2016-08-29 DIAGNOSIS — I5042 Chronic combined systolic (congestive) and diastolic (congestive) heart failure: Secondary | ICD-10-CM | POA: Diagnosis not present

## 2016-08-29 DIAGNOSIS — R278 Other lack of coordination: Secondary | ICD-10-CM | POA: Diagnosis not present

## 2016-08-29 DIAGNOSIS — E1069 Type 1 diabetes mellitus with other specified complication: Secondary | ICD-10-CM | POA: Diagnosis not present

## 2016-08-29 DIAGNOSIS — M6281 Muscle weakness (generalized): Secondary | ICD-10-CM | POA: Diagnosis not present

## 2016-08-29 DIAGNOSIS — E785 Hyperlipidemia, unspecified: Secondary | ICD-10-CM | POA: Diagnosis not present

## 2016-08-29 DIAGNOSIS — I10 Essential (primary) hypertension: Secondary | ICD-10-CM | POA: Diagnosis not present

## 2016-08-29 DIAGNOSIS — K219 Gastro-esophageal reflux disease without esophagitis: Secondary | ICD-10-CM | POA: Diagnosis not present

## 2016-08-29 DIAGNOSIS — R2689 Other abnormalities of gait and mobility: Secondary | ICD-10-CM | POA: Diagnosis not present

## 2016-08-29 DIAGNOSIS — Z5181 Encounter for therapeutic drug level monitoring: Secondary | ICD-10-CM | POA: Diagnosis not present

## 2016-08-30 DIAGNOSIS — R278 Other lack of coordination: Secondary | ICD-10-CM | POA: Diagnosis not present

## 2016-08-30 DIAGNOSIS — I5042 Chronic combined systolic (congestive) and diastolic (congestive) heart failure: Secondary | ICD-10-CM | POA: Diagnosis not present

## 2016-08-30 DIAGNOSIS — K219 Gastro-esophageal reflux disease without esophagitis: Secondary | ICD-10-CM | POA: Diagnosis not present

## 2016-08-30 DIAGNOSIS — M6281 Muscle weakness (generalized): Secondary | ICD-10-CM | POA: Diagnosis not present

## 2016-08-30 DIAGNOSIS — R2689 Other abnormalities of gait and mobility: Secondary | ICD-10-CM | POA: Diagnosis not present

## 2016-08-30 DIAGNOSIS — E785 Hyperlipidemia, unspecified: Secondary | ICD-10-CM | POA: Diagnosis not present

## 2016-08-30 DIAGNOSIS — E1069 Type 1 diabetes mellitus with other specified complication: Secondary | ICD-10-CM | POA: Diagnosis not present

## 2016-08-30 DIAGNOSIS — Z5181 Encounter for therapeutic drug level monitoring: Secondary | ICD-10-CM | POA: Diagnosis not present

## 2016-08-30 DIAGNOSIS — I10 Essential (primary) hypertension: Secondary | ICD-10-CM | POA: Diagnosis not present

## 2016-08-31 DIAGNOSIS — R278 Other lack of coordination: Secondary | ICD-10-CM | POA: Diagnosis not present

## 2016-08-31 DIAGNOSIS — E785 Hyperlipidemia, unspecified: Secondary | ICD-10-CM | POA: Diagnosis not present

## 2016-08-31 DIAGNOSIS — K219 Gastro-esophageal reflux disease without esophagitis: Secondary | ICD-10-CM | POA: Diagnosis not present

## 2016-08-31 DIAGNOSIS — M6281 Muscle weakness (generalized): Secondary | ICD-10-CM | POA: Diagnosis not present

## 2016-08-31 DIAGNOSIS — R2689 Other abnormalities of gait and mobility: Secondary | ICD-10-CM | POA: Diagnosis not present

## 2016-08-31 DIAGNOSIS — Z5181 Encounter for therapeutic drug level monitoring: Secondary | ICD-10-CM | POA: Diagnosis not present

## 2016-08-31 DIAGNOSIS — I5042 Chronic combined systolic (congestive) and diastolic (congestive) heart failure: Secondary | ICD-10-CM | POA: Diagnosis not present

## 2016-08-31 DIAGNOSIS — E1069 Type 1 diabetes mellitus with other specified complication: Secondary | ICD-10-CM | POA: Diagnosis not present

## 2016-08-31 DIAGNOSIS — I10 Essential (primary) hypertension: Secondary | ICD-10-CM | POA: Diagnosis not present

## 2016-09-03 DIAGNOSIS — I10 Essential (primary) hypertension: Secondary | ICD-10-CM | POA: Diagnosis not present

## 2016-09-03 DIAGNOSIS — K219 Gastro-esophageal reflux disease without esophagitis: Secondary | ICD-10-CM | POA: Diagnosis not present

## 2016-09-03 DIAGNOSIS — E785 Hyperlipidemia, unspecified: Secondary | ICD-10-CM | POA: Diagnosis not present

## 2016-09-03 DIAGNOSIS — R278 Other lack of coordination: Secondary | ICD-10-CM | POA: Diagnosis not present

## 2016-09-03 DIAGNOSIS — I5042 Chronic combined systolic (congestive) and diastolic (congestive) heart failure: Secondary | ICD-10-CM | POA: Diagnosis not present

## 2016-09-03 DIAGNOSIS — M6281 Muscle weakness (generalized): Secondary | ICD-10-CM | POA: Diagnosis not present

## 2016-09-03 DIAGNOSIS — R2689 Other abnormalities of gait and mobility: Secondary | ICD-10-CM | POA: Diagnosis not present

## 2016-09-03 DIAGNOSIS — Z5181 Encounter for therapeutic drug level monitoring: Secondary | ICD-10-CM | POA: Diagnosis not present

## 2016-09-03 DIAGNOSIS — E1069 Type 1 diabetes mellitus with other specified complication: Secondary | ICD-10-CM | POA: Diagnosis not present

## 2016-09-04 DIAGNOSIS — E1069 Type 1 diabetes mellitus with other specified complication: Secondary | ICD-10-CM | POA: Diagnosis not present

## 2016-09-04 DIAGNOSIS — Z5181 Encounter for therapeutic drug level monitoring: Secondary | ICD-10-CM | POA: Diagnosis not present

## 2016-09-04 DIAGNOSIS — K219 Gastro-esophageal reflux disease without esophagitis: Secondary | ICD-10-CM | POA: Diagnosis not present

## 2016-09-04 DIAGNOSIS — E785 Hyperlipidemia, unspecified: Secondary | ICD-10-CM | POA: Diagnosis not present

## 2016-09-04 DIAGNOSIS — R278 Other lack of coordination: Secondary | ICD-10-CM | POA: Diagnosis not present

## 2016-09-04 DIAGNOSIS — I10 Essential (primary) hypertension: Secondary | ICD-10-CM | POA: Diagnosis not present

## 2016-09-04 DIAGNOSIS — I5042 Chronic combined systolic (congestive) and diastolic (congestive) heart failure: Secondary | ICD-10-CM | POA: Diagnosis not present

## 2016-09-04 DIAGNOSIS — M6281 Muscle weakness (generalized): Secondary | ICD-10-CM | POA: Diagnosis not present

## 2016-09-04 DIAGNOSIS — R2689 Other abnormalities of gait and mobility: Secondary | ICD-10-CM | POA: Diagnosis not present

## 2016-09-05 DIAGNOSIS — E1069 Type 1 diabetes mellitus with other specified complication: Secondary | ICD-10-CM | POA: Diagnosis not present

## 2016-09-05 DIAGNOSIS — M6281 Muscle weakness (generalized): Secondary | ICD-10-CM | POA: Diagnosis not present

## 2016-09-05 DIAGNOSIS — I5042 Chronic combined systolic (congestive) and diastolic (congestive) heart failure: Secondary | ICD-10-CM | POA: Diagnosis not present

## 2016-09-05 DIAGNOSIS — R278 Other lack of coordination: Secondary | ICD-10-CM | POA: Diagnosis not present

## 2016-09-05 DIAGNOSIS — I10 Essential (primary) hypertension: Secondary | ICD-10-CM | POA: Diagnosis not present

## 2016-09-05 DIAGNOSIS — R2689 Other abnormalities of gait and mobility: Secondary | ICD-10-CM | POA: Diagnosis not present

## 2016-09-05 DIAGNOSIS — Z5181 Encounter for therapeutic drug level monitoring: Secondary | ICD-10-CM | POA: Diagnosis not present

## 2016-09-05 DIAGNOSIS — K219 Gastro-esophageal reflux disease without esophagitis: Secondary | ICD-10-CM | POA: Diagnosis not present

## 2016-09-05 DIAGNOSIS — E785 Hyperlipidemia, unspecified: Secondary | ICD-10-CM | POA: Diagnosis not present

## 2016-09-06 DIAGNOSIS — K219 Gastro-esophageal reflux disease without esophagitis: Secondary | ICD-10-CM | POA: Diagnosis not present

## 2016-09-06 DIAGNOSIS — I10 Essential (primary) hypertension: Secondary | ICD-10-CM | POA: Diagnosis not present

## 2016-09-06 DIAGNOSIS — R278 Other lack of coordination: Secondary | ICD-10-CM | POA: Diagnosis not present

## 2016-09-06 DIAGNOSIS — E785 Hyperlipidemia, unspecified: Secondary | ICD-10-CM | POA: Diagnosis not present

## 2016-09-06 DIAGNOSIS — R2689 Other abnormalities of gait and mobility: Secondary | ICD-10-CM | POA: Diagnosis not present

## 2016-09-06 DIAGNOSIS — Z5181 Encounter for therapeutic drug level monitoring: Secondary | ICD-10-CM | POA: Diagnosis not present

## 2016-09-06 DIAGNOSIS — I5042 Chronic combined systolic (congestive) and diastolic (congestive) heart failure: Secondary | ICD-10-CM | POA: Diagnosis not present

## 2016-09-06 DIAGNOSIS — M6281 Muscle weakness (generalized): Secondary | ICD-10-CM | POA: Diagnosis not present

## 2016-09-06 DIAGNOSIS — E1069 Type 1 diabetes mellitus with other specified complication: Secondary | ICD-10-CM | POA: Diagnosis not present

## 2016-09-07 DIAGNOSIS — E785 Hyperlipidemia, unspecified: Secondary | ICD-10-CM | POA: Diagnosis not present

## 2016-09-07 DIAGNOSIS — I5042 Chronic combined systolic (congestive) and diastolic (congestive) heart failure: Secondary | ICD-10-CM | POA: Diagnosis not present

## 2016-09-07 DIAGNOSIS — Z5181 Encounter for therapeutic drug level monitoring: Secondary | ICD-10-CM | POA: Diagnosis not present

## 2016-09-07 DIAGNOSIS — R278 Other lack of coordination: Secondary | ICD-10-CM | POA: Diagnosis not present

## 2016-09-07 DIAGNOSIS — E1069 Type 1 diabetes mellitus with other specified complication: Secondary | ICD-10-CM | POA: Diagnosis not present

## 2016-09-07 DIAGNOSIS — M6281 Muscle weakness (generalized): Secondary | ICD-10-CM | POA: Diagnosis not present

## 2016-09-07 DIAGNOSIS — K219 Gastro-esophageal reflux disease without esophagitis: Secondary | ICD-10-CM | POA: Diagnosis not present

## 2016-09-07 DIAGNOSIS — R2689 Other abnormalities of gait and mobility: Secondary | ICD-10-CM | POA: Diagnosis not present

## 2016-09-07 DIAGNOSIS — I10 Essential (primary) hypertension: Secondary | ICD-10-CM | POA: Diagnosis not present

## 2016-09-10 DIAGNOSIS — R278 Other lack of coordination: Secondary | ICD-10-CM | POA: Diagnosis not present

## 2016-09-10 DIAGNOSIS — E1069 Type 1 diabetes mellitus with other specified complication: Secondary | ICD-10-CM | POA: Diagnosis not present

## 2016-09-10 DIAGNOSIS — K219 Gastro-esophageal reflux disease without esophagitis: Secondary | ICD-10-CM | POA: Diagnosis not present

## 2016-09-10 DIAGNOSIS — E785 Hyperlipidemia, unspecified: Secondary | ICD-10-CM | POA: Diagnosis not present

## 2016-09-10 DIAGNOSIS — M6281 Muscle weakness (generalized): Secondary | ICD-10-CM | POA: Diagnosis not present

## 2016-09-10 DIAGNOSIS — I10 Essential (primary) hypertension: Secondary | ICD-10-CM | POA: Diagnosis not present

## 2016-09-10 DIAGNOSIS — R2689 Other abnormalities of gait and mobility: Secondary | ICD-10-CM | POA: Diagnosis not present

## 2016-09-10 DIAGNOSIS — Z5181 Encounter for therapeutic drug level monitoring: Secondary | ICD-10-CM | POA: Diagnosis not present

## 2016-09-10 DIAGNOSIS — I5042 Chronic combined systolic (congestive) and diastolic (congestive) heart failure: Secondary | ICD-10-CM | POA: Diagnosis not present

## 2016-09-11 DIAGNOSIS — R2689 Other abnormalities of gait and mobility: Secondary | ICD-10-CM | POA: Diagnosis not present

## 2016-09-11 DIAGNOSIS — K219 Gastro-esophageal reflux disease without esophagitis: Secondary | ICD-10-CM | POA: Diagnosis not present

## 2016-09-11 DIAGNOSIS — E1069 Type 1 diabetes mellitus with other specified complication: Secondary | ICD-10-CM | POA: Diagnosis not present

## 2016-09-11 DIAGNOSIS — M6281 Muscle weakness (generalized): Secondary | ICD-10-CM | POA: Diagnosis not present

## 2016-09-11 DIAGNOSIS — Z5181 Encounter for therapeutic drug level monitoring: Secondary | ICD-10-CM | POA: Diagnosis not present

## 2016-09-11 DIAGNOSIS — E785 Hyperlipidemia, unspecified: Secondary | ICD-10-CM | POA: Diagnosis not present

## 2016-09-11 DIAGNOSIS — I5042 Chronic combined systolic (congestive) and diastolic (congestive) heart failure: Secondary | ICD-10-CM | POA: Diagnosis not present

## 2016-09-11 DIAGNOSIS — I10 Essential (primary) hypertension: Secondary | ICD-10-CM | POA: Diagnosis not present

## 2016-09-11 DIAGNOSIS — R278 Other lack of coordination: Secondary | ICD-10-CM | POA: Diagnosis not present

## 2016-09-12 DIAGNOSIS — R2689 Other abnormalities of gait and mobility: Secondary | ICD-10-CM | POA: Diagnosis not present

## 2016-09-12 DIAGNOSIS — Z5181 Encounter for therapeutic drug level monitoring: Secondary | ICD-10-CM | POA: Diagnosis not present

## 2016-09-12 DIAGNOSIS — E785 Hyperlipidemia, unspecified: Secondary | ICD-10-CM | POA: Diagnosis not present

## 2016-09-12 DIAGNOSIS — M6281 Muscle weakness (generalized): Secondary | ICD-10-CM | POA: Diagnosis not present

## 2016-09-12 DIAGNOSIS — I10 Essential (primary) hypertension: Secondary | ICD-10-CM | POA: Diagnosis not present

## 2016-09-12 DIAGNOSIS — R278 Other lack of coordination: Secondary | ICD-10-CM | POA: Diagnosis not present

## 2016-09-12 DIAGNOSIS — I5042 Chronic combined systolic (congestive) and diastolic (congestive) heart failure: Secondary | ICD-10-CM | POA: Diagnosis not present

## 2016-09-12 DIAGNOSIS — E1069 Type 1 diabetes mellitus with other specified complication: Secondary | ICD-10-CM | POA: Diagnosis not present

## 2016-09-12 DIAGNOSIS — K219 Gastro-esophageal reflux disease without esophagitis: Secondary | ICD-10-CM | POA: Diagnosis not present

## 2016-09-13 DIAGNOSIS — Z5181 Encounter for therapeutic drug level monitoring: Secondary | ICD-10-CM | POA: Diagnosis not present

## 2016-09-13 DIAGNOSIS — R278 Other lack of coordination: Secondary | ICD-10-CM | POA: Diagnosis not present

## 2016-09-13 DIAGNOSIS — I5042 Chronic combined systolic (congestive) and diastolic (congestive) heart failure: Secondary | ICD-10-CM | POA: Diagnosis not present

## 2016-09-13 DIAGNOSIS — K219 Gastro-esophageal reflux disease without esophagitis: Secondary | ICD-10-CM | POA: Diagnosis not present

## 2016-09-13 DIAGNOSIS — M6281 Muscle weakness (generalized): Secondary | ICD-10-CM | POA: Diagnosis not present

## 2016-09-13 DIAGNOSIS — R2689 Other abnormalities of gait and mobility: Secondary | ICD-10-CM | POA: Diagnosis not present

## 2016-09-13 DIAGNOSIS — E1069 Type 1 diabetes mellitus with other specified complication: Secondary | ICD-10-CM | POA: Diagnosis not present

## 2016-09-13 DIAGNOSIS — E785 Hyperlipidemia, unspecified: Secondary | ICD-10-CM | POA: Diagnosis not present

## 2016-09-13 DIAGNOSIS — I10 Essential (primary) hypertension: Secondary | ICD-10-CM | POA: Diagnosis not present

## 2016-09-14 DIAGNOSIS — R278 Other lack of coordination: Secondary | ICD-10-CM | POA: Diagnosis not present

## 2016-09-14 DIAGNOSIS — N179 Acute kidney failure, unspecified: Secondary | ICD-10-CM | POA: Diagnosis not present

## 2016-09-14 DIAGNOSIS — Z952 Presence of prosthetic heart valve: Secondary | ICD-10-CM | POA: Diagnosis not present

## 2016-09-14 DIAGNOSIS — I10 Essential (primary) hypertension: Secondary | ICD-10-CM | POA: Diagnosis not present

## 2016-09-14 DIAGNOSIS — E784 Other hyperlipidemia: Secondary | ICD-10-CM | POA: Diagnosis not present

## 2016-09-14 DIAGNOSIS — E1069 Type 1 diabetes mellitus with other specified complication: Secondary | ICD-10-CM | POA: Diagnosis not present

## 2016-09-14 DIAGNOSIS — M6281 Muscle weakness (generalized): Secondary | ICD-10-CM | POA: Diagnosis not present

## 2016-09-14 DIAGNOSIS — Z5181 Encounter for therapeutic drug level monitoring: Secondary | ICD-10-CM | POA: Diagnosis not present

## 2016-09-14 DIAGNOSIS — I5042 Chronic combined systolic (congestive) and diastolic (congestive) heart failure: Secondary | ICD-10-CM | POA: Diagnosis not present

## 2016-09-14 DIAGNOSIS — R2689 Other abnormalities of gait and mobility: Secondary | ICD-10-CM | POA: Diagnosis not present

## 2016-09-14 DIAGNOSIS — E1310 Other specified diabetes mellitus with ketoacidosis without coma: Secondary | ICD-10-CM | POA: Diagnosis not present

## 2016-09-14 DIAGNOSIS — F418 Other specified anxiety disorders: Secondary | ICD-10-CM | POA: Diagnosis not present

## 2016-09-14 DIAGNOSIS — K219 Gastro-esophageal reflux disease without esophagitis: Secondary | ICD-10-CM | POA: Diagnosis not present

## 2016-09-14 DIAGNOSIS — Z7901 Long term (current) use of anticoagulants: Secondary | ICD-10-CM | POA: Diagnosis not present

## 2016-09-14 DIAGNOSIS — G4709 Other insomnia: Secondary | ICD-10-CM | POA: Diagnosis not present

## 2016-09-14 DIAGNOSIS — E785 Hyperlipidemia, unspecified: Secondary | ICD-10-CM | POA: Diagnosis not present

## 2016-09-14 DIAGNOSIS — F329 Major depressive disorder, single episode, unspecified: Secondary | ICD-10-CM | POA: Diagnosis not present

## 2016-09-15 DIAGNOSIS — R278 Other lack of coordination: Secondary | ICD-10-CM | POA: Diagnosis not present

## 2016-09-15 DIAGNOSIS — E785 Hyperlipidemia, unspecified: Secondary | ICD-10-CM | POA: Diagnosis not present

## 2016-09-15 DIAGNOSIS — R2689 Other abnormalities of gait and mobility: Secondary | ICD-10-CM | POA: Diagnosis not present

## 2016-09-15 DIAGNOSIS — E1069 Type 1 diabetes mellitus with other specified complication: Secondary | ICD-10-CM | POA: Diagnosis not present

## 2016-09-15 DIAGNOSIS — I10 Essential (primary) hypertension: Secondary | ICD-10-CM | POA: Diagnosis not present

## 2016-09-15 DIAGNOSIS — Z5181 Encounter for therapeutic drug level monitoring: Secondary | ICD-10-CM | POA: Diagnosis not present

## 2016-09-15 DIAGNOSIS — M6281 Muscle weakness (generalized): Secondary | ICD-10-CM | POA: Diagnosis not present

## 2016-09-15 DIAGNOSIS — I5042 Chronic combined systolic (congestive) and diastolic (congestive) heart failure: Secondary | ICD-10-CM | POA: Diagnosis not present

## 2016-09-15 DIAGNOSIS — K219 Gastro-esophageal reflux disease without esophagitis: Secondary | ICD-10-CM | POA: Diagnosis not present

## 2016-09-16 DIAGNOSIS — Z5181 Encounter for therapeutic drug level monitoring: Secondary | ICD-10-CM | POA: Diagnosis not present

## 2016-09-16 DIAGNOSIS — R278 Other lack of coordination: Secondary | ICD-10-CM | POA: Diagnosis not present

## 2016-09-16 DIAGNOSIS — K219 Gastro-esophageal reflux disease without esophagitis: Secondary | ICD-10-CM | POA: Diagnosis not present

## 2016-09-16 DIAGNOSIS — I10 Essential (primary) hypertension: Secondary | ICD-10-CM | POA: Diagnosis not present

## 2016-09-16 DIAGNOSIS — E1069 Type 1 diabetes mellitus with other specified complication: Secondary | ICD-10-CM | POA: Diagnosis not present

## 2016-09-16 DIAGNOSIS — R2689 Other abnormalities of gait and mobility: Secondary | ICD-10-CM | POA: Diagnosis not present

## 2016-09-16 DIAGNOSIS — E785 Hyperlipidemia, unspecified: Secondary | ICD-10-CM | POA: Diagnosis not present

## 2016-09-16 DIAGNOSIS — M6281 Muscle weakness (generalized): Secondary | ICD-10-CM | POA: Diagnosis not present

## 2016-09-16 DIAGNOSIS — I5042 Chronic combined systolic (congestive) and diastolic (congestive) heart failure: Secondary | ICD-10-CM | POA: Diagnosis not present

## 2016-09-17 DIAGNOSIS — M6281 Muscle weakness (generalized): Secondary | ICD-10-CM | POA: Diagnosis not present

## 2016-09-17 DIAGNOSIS — E785 Hyperlipidemia, unspecified: Secondary | ICD-10-CM | POA: Diagnosis not present

## 2016-09-17 DIAGNOSIS — R2689 Other abnormalities of gait and mobility: Secondary | ICD-10-CM | POA: Diagnosis not present

## 2016-09-17 DIAGNOSIS — I5042 Chronic combined systolic (congestive) and diastolic (congestive) heart failure: Secondary | ICD-10-CM | POA: Diagnosis not present

## 2016-09-17 DIAGNOSIS — E1069 Type 1 diabetes mellitus with other specified complication: Secondary | ICD-10-CM | POA: Diagnosis not present

## 2016-09-17 DIAGNOSIS — I10 Essential (primary) hypertension: Secondary | ICD-10-CM | POA: Diagnosis not present

## 2016-09-17 DIAGNOSIS — K219 Gastro-esophageal reflux disease without esophagitis: Secondary | ICD-10-CM | POA: Diagnosis not present

## 2016-09-17 DIAGNOSIS — R278 Other lack of coordination: Secondary | ICD-10-CM | POA: Diagnosis not present

## 2016-09-17 DIAGNOSIS — Z5181 Encounter for therapeutic drug level monitoring: Secondary | ICD-10-CM | POA: Diagnosis not present

## 2016-09-18 DIAGNOSIS — I5042 Chronic combined systolic (congestive) and diastolic (congestive) heart failure: Secondary | ICD-10-CM | POA: Diagnosis not present

## 2016-09-18 DIAGNOSIS — K219 Gastro-esophageal reflux disease without esophagitis: Secondary | ICD-10-CM | POA: Diagnosis not present

## 2016-09-18 DIAGNOSIS — R278 Other lack of coordination: Secondary | ICD-10-CM | POA: Diagnosis not present

## 2016-09-18 DIAGNOSIS — E1069 Type 1 diabetes mellitus with other specified complication: Secondary | ICD-10-CM | POA: Diagnosis not present

## 2016-09-18 DIAGNOSIS — M6281 Muscle weakness (generalized): Secondary | ICD-10-CM | POA: Diagnosis not present

## 2016-09-18 DIAGNOSIS — I10 Essential (primary) hypertension: Secondary | ICD-10-CM | POA: Diagnosis not present

## 2016-09-18 DIAGNOSIS — E785 Hyperlipidemia, unspecified: Secondary | ICD-10-CM | POA: Diagnosis not present

## 2016-09-18 DIAGNOSIS — R2689 Other abnormalities of gait and mobility: Secondary | ICD-10-CM | POA: Diagnosis not present

## 2016-09-18 DIAGNOSIS — Z5181 Encounter for therapeutic drug level monitoring: Secondary | ICD-10-CM | POA: Diagnosis not present

## 2016-09-19 DIAGNOSIS — R2689 Other abnormalities of gait and mobility: Secondary | ICD-10-CM | POA: Diagnosis not present

## 2016-09-19 DIAGNOSIS — I5042 Chronic combined systolic (congestive) and diastolic (congestive) heart failure: Secondary | ICD-10-CM | POA: Diagnosis not present

## 2016-09-19 DIAGNOSIS — R278 Other lack of coordination: Secondary | ICD-10-CM | POA: Diagnosis not present

## 2016-09-19 DIAGNOSIS — Z5181 Encounter for therapeutic drug level monitoring: Secondary | ICD-10-CM | POA: Diagnosis not present

## 2016-09-19 DIAGNOSIS — I10 Essential (primary) hypertension: Secondary | ICD-10-CM | POA: Diagnosis not present

## 2016-09-19 DIAGNOSIS — E785 Hyperlipidemia, unspecified: Secondary | ICD-10-CM | POA: Diagnosis not present

## 2016-09-19 DIAGNOSIS — M6281 Muscle weakness (generalized): Secondary | ICD-10-CM | POA: Diagnosis not present

## 2016-09-19 DIAGNOSIS — E1069 Type 1 diabetes mellitus with other specified complication: Secondary | ICD-10-CM | POA: Diagnosis not present

## 2016-09-19 DIAGNOSIS — K219 Gastro-esophageal reflux disease without esophagitis: Secondary | ICD-10-CM | POA: Diagnosis not present

## 2016-09-21 DIAGNOSIS — I5042 Chronic combined systolic (congestive) and diastolic (congestive) heart failure: Secondary | ICD-10-CM | POA: Diagnosis not present

## 2016-09-21 DIAGNOSIS — I10 Essential (primary) hypertension: Secondary | ICD-10-CM | POA: Diagnosis not present

## 2016-09-21 DIAGNOSIS — R2689 Other abnormalities of gait and mobility: Secondary | ICD-10-CM | POA: Diagnosis not present

## 2016-09-21 DIAGNOSIS — M6281 Muscle weakness (generalized): Secondary | ICD-10-CM | POA: Diagnosis not present

## 2016-09-21 DIAGNOSIS — E785 Hyperlipidemia, unspecified: Secondary | ICD-10-CM | POA: Diagnosis not present

## 2016-09-21 DIAGNOSIS — K219 Gastro-esophageal reflux disease without esophagitis: Secondary | ICD-10-CM | POA: Diagnosis not present

## 2016-09-21 DIAGNOSIS — E1069 Type 1 diabetes mellitus with other specified complication: Secondary | ICD-10-CM | POA: Diagnosis not present

## 2016-09-21 DIAGNOSIS — W19XXXD Unspecified fall, subsequent encounter: Secondary | ICD-10-CM | POA: Diagnosis not present

## 2016-09-21 DIAGNOSIS — Z5181 Encounter for therapeutic drug level monitoring: Secondary | ICD-10-CM | POA: Diagnosis not present

## 2016-09-21 DIAGNOSIS — E108 Type 1 diabetes mellitus with unspecified complications: Secondary | ICD-10-CM | POA: Diagnosis not present

## 2016-09-21 DIAGNOSIS — R278 Other lack of coordination: Secondary | ICD-10-CM | POA: Diagnosis not present

## 2016-09-21 DIAGNOSIS — I5032 Chronic diastolic (congestive) heart failure: Secondary | ICD-10-CM | POA: Diagnosis not present

## 2016-09-24 DIAGNOSIS — E785 Hyperlipidemia, unspecified: Secondary | ICD-10-CM | POA: Diagnosis not present

## 2016-09-24 DIAGNOSIS — I10 Essential (primary) hypertension: Secondary | ICD-10-CM | POA: Diagnosis not present

## 2016-09-24 DIAGNOSIS — Z5181 Encounter for therapeutic drug level monitoring: Secondary | ICD-10-CM | POA: Diagnosis not present

## 2016-09-24 DIAGNOSIS — R278 Other lack of coordination: Secondary | ICD-10-CM | POA: Diagnosis not present

## 2016-09-24 DIAGNOSIS — E1069 Type 1 diabetes mellitus with other specified complication: Secondary | ICD-10-CM | POA: Diagnosis not present

## 2016-09-24 DIAGNOSIS — I5042 Chronic combined systolic (congestive) and diastolic (congestive) heart failure: Secondary | ICD-10-CM | POA: Diagnosis not present

## 2016-09-24 DIAGNOSIS — M6281 Muscle weakness (generalized): Secondary | ICD-10-CM | POA: Diagnosis not present

## 2016-09-24 DIAGNOSIS — K219 Gastro-esophageal reflux disease without esophagitis: Secondary | ICD-10-CM | POA: Diagnosis not present

## 2016-09-24 DIAGNOSIS — R2689 Other abnormalities of gait and mobility: Secondary | ICD-10-CM | POA: Diagnosis not present

## 2016-09-26 DIAGNOSIS — E1069 Type 1 diabetes mellitus with other specified complication: Secondary | ICD-10-CM | POA: Diagnosis not present

## 2016-09-26 DIAGNOSIS — M6281 Muscle weakness (generalized): Secondary | ICD-10-CM | POA: Diagnosis not present

## 2016-09-26 DIAGNOSIS — I5042 Chronic combined systolic (congestive) and diastolic (congestive) heart failure: Secondary | ICD-10-CM | POA: Diagnosis not present

## 2016-09-26 DIAGNOSIS — I10 Essential (primary) hypertension: Secondary | ICD-10-CM | POA: Diagnosis not present

## 2016-09-26 DIAGNOSIS — R278 Other lack of coordination: Secondary | ICD-10-CM | POA: Diagnosis not present

## 2016-09-26 DIAGNOSIS — K219 Gastro-esophageal reflux disease without esophagitis: Secondary | ICD-10-CM | POA: Diagnosis not present

## 2016-09-26 DIAGNOSIS — E785 Hyperlipidemia, unspecified: Secondary | ICD-10-CM | POA: Diagnosis not present

## 2016-09-26 DIAGNOSIS — R2689 Other abnormalities of gait and mobility: Secondary | ICD-10-CM | POA: Diagnosis not present

## 2016-09-26 DIAGNOSIS — Z5181 Encounter for therapeutic drug level monitoring: Secondary | ICD-10-CM | POA: Diagnosis not present

## 2016-09-27 DIAGNOSIS — M6281 Muscle weakness (generalized): Secondary | ICD-10-CM | POA: Diagnosis not present

## 2016-09-27 DIAGNOSIS — R2689 Other abnormalities of gait and mobility: Secondary | ICD-10-CM | POA: Diagnosis not present

## 2016-09-27 DIAGNOSIS — E1069 Type 1 diabetes mellitus with other specified complication: Secondary | ICD-10-CM | POA: Diagnosis not present

## 2016-09-27 DIAGNOSIS — Z5181 Encounter for therapeutic drug level monitoring: Secondary | ICD-10-CM | POA: Diagnosis not present

## 2016-09-27 DIAGNOSIS — I5042 Chronic combined systolic (congestive) and diastolic (congestive) heart failure: Secondary | ICD-10-CM | POA: Diagnosis not present

## 2016-09-27 DIAGNOSIS — R278 Other lack of coordination: Secondary | ICD-10-CM | POA: Diagnosis not present

## 2016-09-27 DIAGNOSIS — I10 Essential (primary) hypertension: Secondary | ICD-10-CM | POA: Diagnosis not present

## 2016-09-27 DIAGNOSIS — K219 Gastro-esophageal reflux disease without esophagitis: Secondary | ICD-10-CM | POA: Diagnosis not present

## 2016-09-27 DIAGNOSIS — E785 Hyperlipidemia, unspecified: Secondary | ICD-10-CM | POA: Diagnosis not present

## 2016-09-28 DIAGNOSIS — M6281 Muscle weakness (generalized): Secondary | ICD-10-CM | POA: Diagnosis not present

## 2016-09-28 DIAGNOSIS — Z5181 Encounter for therapeutic drug level monitoring: Secondary | ICD-10-CM | POA: Diagnosis not present

## 2016-09-28 DIAGNOSIS — K219 Gastro-esophageal reflux disease without esophagitis: Secondary | ICD-10-CM | POA: Diagnosis not present

## 2016-09-28 DIAGNOSIS — E1069 Type 1 diabetes mellitus with other specified complication: Secondary | ICD-10-CM | POA: Diagnosis not present

## 2016-09-28 DIAGNOSIS — R2689 Other abnormalities of gait and mobility: Secondary | ICD-10-CM | POA: Diagnosis not present

## 2016-09-28 DIAGNOSIS — R278 Other lack of coordination: Secondary | ICD-10-CM | POA: Diagnosis not present

## 2016-09-28 DIAGNOSIS — E785 Hyperlipidemia, unspecified: Secondary | ICD-10-CM | POA: Diagnosis not present

## 2016-09-28 DIAGNOSIS — I10 Essential (primary) hypertension: Secondary | ICD-10-CM | POA: Diagnosis not present

## 2016-09-28 DIAGNOSIS — I5042 Chronic combined systolic (congestive) and diastolic (congestive) heart failure: Secondary | ICD-10-CM | POA: Diagnosis not present

## 2016-10-01 DIAGNOSIS — Z5181 Encounter for therapeutic drug level monitoring: Secondary | ICD-10-CM | POA: Diagnosis not present

## 2016-10-01 DIAGNOSIS — K219 Gastro-esophageal reflux disease without esophagitis: Secondary | ICD-10-CM | POA: Diagnosis not present

## 2016-10-01 DIAGNOSIS — Z952 Presence of prosthetic heart valve: Secondary | ICD-10-CM | POA: Diagnosis not present

## 2016-10-01 DIAGNOSIS — E1069 Type 1 diabetes mellitus with other specified complication: Secondary | ICD-10-CM | POA: Diagnosis not present

## 2016-10-01 DIAGNOSIS — R2689 Other abnormalities of gait and mobility: Secondary | ICD-10-CM | POA: Diagnosis not present

## 2016-10-01 DIAGNOSIS — E785 Hyperlipidemia, unspecified: Secondary | ICD-10-CM | POA: Diagnosis not present

## 2016-10-01 DIAGNOSIS — M6281 Muscle weakness (generalized): Secondary | ICD-10-CM | POA: Diagnosis not present

## 2016-10-01 DIAGNOSIS — I10 Essential (primary) hypertension: Secondary | ICD-10-CM | POA: Diagnosis not present

## 2016-10-01 DIAGNOSIS — E109 Type 1 diabetes mellitus without complications: Secondary | ICD-10-CM | POA: Diagnosis not present

## 2016-10-01 DIAGNOSIS — I5032 Chronic diastolic (congestive) heart failure: Secondary | ICD-10-CM | POA: Diagnosis not present

## 2016-10-01 DIAGNOSIS — I5042 Chronic combined systolic (congestive) and diastolic (congestive) heart failure: Secondary | ICD-10-CM | POA: Diagnosis not present

## 2016-10-01 DIAGNOSIS — R278 Other lack of coordination: Secondary | ICD-10-CM | POA: Diagnosis not present

## 2016-10-02 DIAGNOSIS — E785 Hyperlipidemia, unspecified: Secondary | ICD-10-CM | POA: Diagnosis not present

## 2016-10-02 DIAGNOSIS — I5042 Chronic combined systolic (congestive) and diastolic (congestive) heart failure: Secondary | ICD-10-CM | POA: Diagnosis not present

## 2016-10-02 DIAGNOSIS — R2689 Other abnormalities of gait and mobility: Secondary | ICD-10-CM | POA: Diagnosis not present

## 2016-10-02 DIAGNOSIS — K219 Gastro-esophageal reflux disease without esophagitis: Secondary | ICD-10-CM | POA: Diagnosis not present

## 2016-10-02 DIAGNOSIS — E1069 Type 1 diabetes mellitus with other specified complication: Secondary | ICD-10-CM | POA: Diagnosis not present

## 2016-10-02 DIAGNOSIS — R278 Other lack of coordination: Secondary | ICD-10-CM | POA: Diagnosis not present

## 2016-10-02 DIAGNOSIS — M6281 Muscle weakness (generalized): Secondary | ICD-10-CM | POA: Diagnosis not present

## 2016-10-02 DIAGNOSIS — Z5181 Encounter for therapeutic drug level monitoring: Secondary | ICD-10-CM | POA: Diagnosis not present

## 2016-10-02 DIAGNOSIS — I10 Essential (primary) hypertension: Secondary | ICD-10-CM | POA: Diagnosis not present

## 2016-10-03 DIAGNOSIS — R278 Other lack of coordination: Secondary | ICD-10-CM | POA: Diagnosis not present

## 2016-10-03 DIAGNOSIS — E1069 Type 1 diabetes mellitus with other specified complication: Secondary | ICD-10-CM | POA: Diagnosis not present

## 2016-10-03 DIAGNOSIS — M6281 Muscle weakness (generalized): Secondary | ICD-10-CM | POA: Diagnosis not present

## 2016-10-03 DIAGNOSIS — I10 Essential (primary) hypertension: Secondary | ICD-10-CM | POA: Diagnosis not present

## 2016-10-03 DIAGNOSIS — I5042 Chronic combined systolic (congestive) and diastolic (congestive) heart failure: Secondary | ICD-10-CM | POA: Diagnosis not present

## 2016-10-03 DIAGNOSIS — Z5181 Encounter for therapeutic drug level monitoring: Secondary | ICD-10-CM | POA: Diagnosis not present

## 2016-10-03 DIAGNOSIS — E785 Hyperlipidemia, unspecified: Secondary | ICD-10-CM | POA: Diagnosis not present

## 2016-10-03 DIAGNOSIS — K219 Gastro-esophageal reflux disease without esophagitis: Secondary | ICD-10-CM | POA: Diagnosis not present

## 2016-10-03 DIAGNOSIS — R2689 Other abnormalities of gait and mobility: Secondary | ICD-10-CM | POA: Diagnosis not present

## 2016-10-05 DIAGNOSIS — I10 Essential (primary) hypertension: Secondary | ICD-10-CM | POA: Diagnosis not present

## 2016-10-05 DIAGNOSIS — E785 Hyperlipidemia, unspecified: Secondary | ICD-10-CM | POA: Diagnosis not present

## 2016-10-05 DIAGNOSIS — R2689 Other abnormalities of gait and mobility: Secondary | ICD-10-CM | POA: Diagnosis not present

## 2016-10-05 DIAGNOSIS — K219 Gastro-esophageal reflux disease without esophagitis: Secondary | ICD-10-CM | POA: Diagnosis not present

## 2016-10-05 DIAGNOSIS — M6281 Muscle weakness (generalized): Secondary | ICD-10-CM | POA: Diagnosis not present

## 2016-10-05 DIAGNOSIS — Z5181 Encounter for therapeutic drug level monitoring: Secondary | ICD-10-CM | POA: Diagnosis not present

## 2016-10-05 DIAGNOSIS — I5042 Chronic combined systolic (congestive) and diastolic (congestive) heart failure: Secondary | ICD-10-CM | POA: Diagnosis not present

## 2016-10-05 DIAGNOSIS — R278 Other lack of coordination: Secondary | ICD-10-CM | POA: Diagnosis not present

## 2016-10-05 DIAGNOSIS — E1069 Type 1 diabetes mellitus with other specified complication: Secondary | ICD-10-CM | POA: Diagnosis not present

## 2016-10-08 DIAGNOSIS — R278 Other lack of coordination: Secondary | ICD-10-CM | POA: Diagnosis not present

## 2016-10-08 DIAGNOSIS — M6281 Muscle weakness (generalized): Secondary | ICD-10-CM | POA: Diagnosis not present

## 2016-10-08 DIAGNOSIS — R2689 Other abnormalities of gait and mobility: Secondary | ICD-10-CM | POA: Diagnosis not present

## 2016-10-08 DIAGNOSIS — I5042 Chronic combined systolic (congestive) and diastolic (congestive) heart failure: Secondary | ICD-10-CM | POA: Diagnosis not present

## 2016-10-08 DIAGNOSIS — K219 Gastro-esophageal reflux disease without esophagitis: Secondary | ICD-10-CM | POA: Diagnosis not present

## 2016-10-08 DIAGNOSIS — Z5181 Encounter for therapeutic drug level monitoring: Secondary | ICD-10-CM | POA: Diagnosis not present

## 2016-10-08 DIAGNOSIS — E1069 Type 1 diabetes mellitus with other specified complication: Secondary | ICD-10-CM | POA: Diagnosis not present

## 2016-10-08 DIAGNOSIS — E785 Hyperlipidemia, unspecified: Secondary | ICD-10-CM | POA: Diagnosis not present

## 2016-10-08 DIAGNOSIS — I10 Essential (primary) hypertension: Secondary | ICD-10-CM | POA: Diagnosis not present

## 2016-10-10 DIAGNOSIS — I10 Essential (primary) hypertension: Secondary | ICD-10-CM | POA: Diagnosis not present

## 2016-10-10 DIAGNOSIS — M6281 Muscle weakness (generalized): Secondary | ICD-10-CM | POA: Diagnosis not present

## 2016-10-10 DIAGNOSIS — Z5181 Encounter for therapeutic drug level monitoring: Secondary | ICD-10-CM | POA: Diagnosis not present

## 2016-10-10 DIAGNOSIS — E1069 Type 1 diabetes mellitus with other specified complication: Secondary | ICD-10-CM | POA: Diagnosis not present

## 2016-10-10 DIAGNOSIS — I5042 Chronic combined systolic (congestive) and diastolic (congestive) heart failure: Secondary | ICD-10-CM | POA: Diagnosis not present

## 2016-10-10 DIAGNOSIS — R278 Other lack of coordination: Secondary | ICD-10-CM | POA: Diagnosis not present

## 2016-10-10 DIAGNOSIS — E785 Hyperlipidemia, unspecified: Secondary | ICD-10-CM | POA: Diagnosis not present

## 2016-10-10 DIAGNOSIS — R2689 Other abnormalities of gait and mobility: Secondary | ICD-10-CM | POA: Diagnosis not present

## 2016-10-10 DIAGNOSIS — K219 Gastro-esophageal reflux disease without esophagitis: Secondary | ICD-10-CM | POA: Diagnosis not present

## 2016-10-12 DIAGNOSIS — I10 Essential (primary) hypertension: Secondary | ICD-10-CM | POA: Diagnosis not present

## 2016-10-12 DIAGNOSIS — Z5181 Encounter for therapeutic drug level monitoring: Secondary | ICD-10-CM | POA: Diagnosis not present

## 2016-10-12 DIAGNOSIS — E785 Hyperlipidemia, unspecified: Secondary | ICD-10-CM | POA: Diagnosis not present

## 2016-10-12 DIAGNOSIS — R278 Other lack of coordination: Secondary | ICD-10-CM | POA: Diagnosis not present

## 2016-10-12 DIAGNOSIS — R2689 Other abnormalities of gait and mobility: Secondary | ICD-10-CM | POA: Diagnosis not present

## 2016-10-12 DIAGNOSIS — I5042 Chronic combined systolic (congestive) and diastolic (congestive) heart failure: Secondary | ICD-10-CM | POA: Diagnosis not present

## 2016-10-12 DIAGNOSIS — K219 Gastro-esophageal reflux disease without esophagitis: Secondary | ICD-10-CM | POA: Diagnosis not present

## 2016-10-12 DIAGNOSIS — M6281 Muscle weakness (generalized): Secondary | ICD-10-CM | POA: Diagnosis not present

## 2016-10-12 DIAGNOSIS — E1069 Type 1 diabetes mellitus with other specified complication: Secondary | ICD-10-CM | POA: Diagnosis not present

## 2016-10-15 DIAGNOSIS — K219 Gastro-esophageal reflux disease without esophagitis: Secondary | ICD-10-CM | POA: Diagnosis not present

## 2016-10-15 DIAGNOSIS — E785 Hyperlipidemia, unspecified: Secondary | ICD-10-CM | POA: Diagnosis not present

## 2016-10-15 DIAGNOSIS — I10 Essential (primary) hypertension: Secondary | ICD-10-CM | POA: Diagnosis not present

## 2016-10-15 DIAGNOSIS — R2689 Other abnormalities of gait and mobility: Secondary | ICD-10-CM | POA: Diagnosis not present

## 2016-10-15 DIAGNOSIS — Z5181 Encounter for therapeutic drug level monitoring: Secondary | ICD-10-CM | POA: Diagnosis not present

## 2016-10-15 DIAGNOSIS — R278 Other lack of coordination: Secondary | ICD-10-CM | POA: Diagnosis not present

## 2016-10-15 DIAGNOSIS — E1069 Type 1 diabetes mellitus with other specified complication: Secondary | ICD-10-CM | POA: Diagnosis not present

## 2016-10-15 DIAGNOSIS — M6281 Muscle weakness (generalized): Secondary | ICD-10-CM | POA: Diagnosis not present

## 2016-10-15 DIAGNOSIS — I5042 Chronic combined systolic (congestive) and diastolic (congestive) heart failure: Secondary | ICD-10-CM | POA: Diagnosis not present

## 2016-10-17 DIAGNOSIS — I5042 Chronic combined systolic (congestive) and diastolic (congestive) heart failure: Secondary | ICD-10-CM | POA: Diagnosis not present

## 2016-10-17 DIAGNOSIS — E113553 Type 2 diabetes mellitus with stable proliferative diabetic retinopathy, bilateral: Secondary | ICD-10-CM | POA: Diagnosis not present

## 2016-10-17 DIAGNOSIS — E1069 Type 1 diabetes mellitus with other specified complication: Secondary | ICD-10-CM | POA: Diagnosis not present

## 2016-10-17 DIAGNOSIS — K219 Gastro-esophageal reflux disease without esophagitis: Secondary | ICD-10-CM | POA: Diagnosis not present

## 2016-10-17 DIAGNOSIS — H2513 Age-related nuclear cataract, bilateral: Secondary | ICD-10-CM | POA: Diagnosis not present

## 2016-10-17 DIAGNOSIS — Z5181 Encounter for therapeutic drug level monitoring: Secondary | ICD-10-CM | POA: Diagnosis not present

## 2016-10-17 DIAGNOSIS — E785 Hyperlipidemia, unspecified: Secondary | ICD-10-CM | POA: Diagnosis not present

## 2016-10-17 DIAGNOSIS — M6281 Muscle weakness (generalized): Secondary | ICD-10-CM | POA: Diagnosis not present

## 2016-10-17 DIAGNOSIS — Z794 Long term (current) use of insulin: Secondary | ICD-10-CM | POA: Diagnosis not present

## 2016-10-17 DIAGNOSIS — R278 Other lack of coordination: Secondary | ICD-10-CM | POA: Diagnosis not present

## 2016-10-17 DIAGNOSIS — I10 Essential (primary) hypertension: Secondary | ICD-10-CM | POA: Diagnosis not present

## 2016-10-17 DIAGNOSIS — R2689 Other abnormalities of gait and mobility: Secondary | ICD-10-CM | POA: Diagnosis not present

## 2016-10-18 DIAGNOSIS — E119 Type 2 diabetes mellitus without complications: Secondary | ICD-10-CM | POA: Diagnosis not present

## 2016-10-19 DIAGNOSIS — Z952 Presence of prosthetic heart valve: Secondary | ICD-10-CM | POA: Diagnosis not present

## 2016-10-19 DIAGNOSIS — R2689 Other abnormalities of gait and mobility: Secondary | ICD-10-CM | POA: Diagnosis not present

## 2016-10-19 DIAGNOSIS — E785 Hyperlipidemia, unspecified: Secondary | ICD-10-CM | POA: Diagnosis not present

## 2016-10-19 DIAGNOSIS — I10 Essential (primary) hypertension: Secondary | ICD-10-CM | POA: Diagnosis not present

## 2016-10-19 DIAGNOSIS — Z5181 Encounter for therapeutic drug level monitoring: Secondary | ICD-10-CM | POA: Diagnosis not present

## 2016-10-19 DIAGNOSIS — I5032 Chronic diastolic (congestive) heart failure: Secondary | ICD-10-CM | POA: Diagnosis not present

## 2016-10-19 DIAGNOSIS — E1069 Type 1 diabetes mellitus with other specified complication: Secondary | ICD-10-CM | POA: Diagnosis not present

## 2016-10-19 DIAGNOSIS — K219 Gastro-esophageal reflux disease without esophagitis: Secondary | ICD-10-CM | POA: Diagnosis not present

## 2016-10-19 DIAGNOSIS — M6281 Muscle weakness (generalized): Secondary | ICD-10-CM | POA: Diagnosis not present

## 2016-10-19 DIAGNOSIS — E108 Type 1 diabetes mellitus with unspecified complications: Secondary | ICD-10-CM | POA: Diagnosis not present

## 2016-10-19 DIAGNOSIS — R739 Hyperglycemia, unspecified: Secondary | ICD-10-CM | POA: Diagnosis not present

## 2016-10-19 DIAGNOSIS — R278 Other lack of coordination: Secondary | ICD-10-CM | POA: Diagnosis not present

## 2016-10-19 DIAGNOSIS — I5042 Chronic combined systolic (congestive) and diastolic (congestive) heart failure: Secondary | ICD-10-CM | POA: Diagnosis not present

## 2016-10-21 ENCOUNTER — Emergency Department (HOSPITAL_COMMUNITY): Payer: Commercial Managed Care - HMO

## 2016-10-21 ENCOUNTER — Encounter (HOSPITAL_COMMUNITY): Payer: Self-pay

## 2016-10-21 ENCOUNTER — Inpatient Hospital Stay (HOSPITAL_COMMUNITY)
Admission: EM | Admit: 2016-10-21 | Discharge: 2016-10-24 | DRG: 638 | Disposition: A | Discharge status: Skilled Nursing Facility | Payer: Commercial Managed Care - HMO | Attending: Internal Medicine | Admitting: Internal Medicine

## 2016-10-21 DIAGNOSIS — E109 Type 1 diabetes mellitus without complications: Secondary | ICD-10-CM | POA: Diagnosis not present

## 2016-10-21 DIAGNOSIS — E1065 Type 1 diabetes mellitus with hyperglycemia: Secondary | ICD-10-CM | POA: Diagnosis not present

## 2016-10-21 DIAGNOSIS — Z952 Presence of prosthetic heart valve: Secondary | ICD-10-CM | POA: Diagnosis not present

## 2016-10-21 DIAGNOSIS — Z951 Presence of aortocoronary bypass graft: Secondary | ICD-10-CM

## 2016-10-21 DIAGNOSIS — I69354 Hemiplegia and hemiparesis following cerebral infarction affecting left non-dominant side: Secondary | ICD-10-CM

## 2016-10-21 DIAGNOSIS — E1101 Type 2 diabetes mellitus with hyperosmolarity with coma: Secondary | ICD-10-CM | POA: Diagnosis not present

## 2016-10-21 DIAGNOSIS — E1365 Other specified diabetes mellitus with hyperglycemia: Secondary | ICD-10-CM | POA: Diagnosis not present

## 2016-10-21 DIAGNOSIS — R739 Hyperglycemia, unspecified: Secondary | ICD-10-CM | POA: Diagnosis not present

## 2016-10-21 DIAGNOSIS — Z888 Allergy status to other drugs, medicaments and biological substances status: Secondary | ICD-10-CM

## 2016-10-21 DIAGNOSIS — N179 Acute kidney failure, unspecified: Secondary | ICD-10-CM

## 2016-10-21 DIAGNOSIS — I110 Hypertensive heart disease with heart failure: Secondary | ICD-10-CM | POA: Diagnosis not present

## 2016-10-21 DIAGNOSIS — Z955 Presence of coronary angioplasty implant and graft: Secondary | ICD-10-CM

## 2016-10-21 DIAGNOSIS — F329 Major depressive disorder, single episode, unspecified: Secondary | ICD-10-CM | POA: Diagnosis present

## 2016-10-21 DIAGNOSIS — Z7901 Long term (current) use of anticoagulants: Secondary | ICD-10-CM | POA: Diagnosis not present

## 2016-10-21 DIAGNOSIS — Z79899 Other long term (current) drug therapy: Secondary | ICD-10-CM | POA: Diagnosis not present

## 2016-10-21 DIAGNOSIS — Z9851 Tubal ligation status: Secondary | ICD-10-CM | POA: Diagnosis not present

## 2016-10-21 DIAGNOSIS — I10 Essential (primary) hypertension: Secondary | ICD-10-CM | POA: Diagnosis not present

## 2016-10-21 DIAGNOSIS — Z794 Long term (current) use of insulin: Secondary | ICD-10-CM

## 2016-10-21 DIAGNOSIS — Z5181 Encounter for therapeutic drug level monitoring: Secondary | ICD-10-CM | POA: Diagnosis not present

## 2016-10-21 DIAGNOSIS — E118 Type 2 diabetes mellitus with unspecified complications: Secondary | ICD-10-CM | POA: Diagnosis not present

## 2016-10-21 DIAGNOSIS — I5032 Chronic diastolic (congestive) heart failure: Secondary | ICD-10-CM | POA: Diagnosis not present

## 2016-10-21 DIAGNOSIS — I5042 Chronic combined systolic (congestive) and diastolic (congestive) heart failure: Secondary | ICD-10-CM | POA: Diagnosis not present

## 2016-10-21 DIAGNOSIS — Z9842 Cataract extraction status, left eye: Secondary | ICD-10-CM | POA: Diagnosis not present

## 2016-10-21 DIAGNOSIS — E1100 Type 2 diabetes mellitus with hyperosmolarity without nonketotic hyperglycemic-hyperosmolar coma (NKHHC): Principal | ICD-10-CM | POA: Diagnosis present

## 2016-10-21 DIAGNOSIS — I2510 Atherosclerotic heart disease of native coronary artery without angina pectoris: Secondary | ICD-10-CM | POA: Diagnosis present

## 2016-10-21 DIAGNOSIS — R278 Other lack of coordination: Secondary | ICD-10-CM | POA: Diagnosis not present

## 2016-10-21 DIAGNOSIS — K219 Gastro-esophageal reflux disease without esophagitis: Secondary | ICD-10-CM | POA: Diagnosis not present

## 2016-10-21 DIAGNOSIS — R7309 Other abnormal glucose: Secondary | ICD-10-CM | POA: Diagnosis not present

## 2016-10-21 DIAGNOSIS — R2689 Other abnormalities of gait and mobility: Secondary | ICD-10-CM | POA: Diagnosis not present

## 2016-10-21 DIAGNOSIS — E860 Dehydration: Secondary | ICD-10-CM | POA: Diagnosis not present

## 2016-10-21 DIAGNOSIS — M6281 Muscle weakness (generalized): Secondary | ICD-10-CM | POA: Diagnosis not present

## 2016-10-21 DIAGNOSIS — E08649 Diabetes mellitus due to underlying condition with hypoglycemia without coma: Secondary | ICD-10-CM | POA: Diagnosis not present

## 2016-10-21 DIAGNOSIS — E1069 Type 1 diabetes mellitus with other specified complication: Secondary | ICD-10-CM | POA: Diagnosis not present

## 2016-10-21 DIAGNOSIS — R7889 Finding of other specified substances, not normally found in blood: Secondary | ICD-10-CM | POA: Diagnosis not present

## 2016-10-21 DIAGNOSIS — R531 Weakness: Secondary | ICD-10-CM | POA: Diagnosis not present

## 2016-10-21 DIAGNOSIS — E11649 Type 2 diabetes mellitus with hypoglycemia without coma: Secondary | ICD-10-CM | POA: Diagnosis not present

## 2016-10-21 DIAGNOSIS — E785 Hyperlipidemia, unspecified: Secondary | ICD-10-CM | POA: Diagnosis not present

## 2016-10-21 DIAGNOSIS — E1165 Type 2 diabetes mellitus with hyperglycemia: Secondary | ICD-10-CM | POA: Diagnosis not present

## 2016-10-21 LAB — BASIC METABOLIC PANEL
Anion gap: 10 (ref 5–15)
BUN: 26 mg/dL — ABNORMAL HIGH (ref 6–20)
CHLORIDE: 85 mmol/L — AB (ref 101–111)
CO2: 23 mmol/L (ref 22–32)
Calcium: 8.5 mg/dL — ABNORMAL LOW (ref 8.9–10.3)
Creatinine, Ser: 1.42 mg/dL — ABNORMAL HIGH (ref 0.44–1.00)
GFR, EST AFRICAN AMERICAN: 47 mL/min — AB (ref >60)
GFR, EST NON AFRICAN AMERICAN: 41 mL/min — AB (ref >60)
Glucose, Bld: 1019 mg/dL (ref 65–99)
POTASSIUM: 5.2 mmol/L — AB (ref 3.5–5.1)
SODIUM: 118 mmol/L — AB (ref 135–145)

## 2016-10-21 LAB — CBG MONITORING, ED: Glucose-Capillary: >600 mg/dL (ref 65–99)

## 2016-10-21 LAB — BLOOD GAS, VENOUS
Acid-base deficit: 1.2 mmol/L (ref 0.0–2.0)
Bicarbonate: 24.1 mmol/L (ref 20.0–28.0)
O2 Saturation: 68.9 %
PATIENT TEMPERATURE: 98.6
PH VEN: 7.342 (ref 7.250–7.430)
PO2 VEN: 41.1 mmHg (ref 32.0–45.0)
pCO2, Ven: 45.6 mmHg (ref 44.0–60.0)

## 2016-10-21 LAB — GLUCOSE, CAPILLARY
GLUCOSE-CAPILLARY: 137 mg/dL — AB (ref 65–99)
GLUCOSE-CAPILLARY: 279 mg/dL — AB (ref 65–99)
GLUCOSE-CAPILLARY: 463 mg/dL — AB (ref 65–99)

## 2016-10-21 LAB — URINALYSIS, ROUTINE W REFLEX MICROSCOPIC
Bilirubin Urine: NEGATIVE
Hgb urine dipstick: NEGATIVE
Ketones, ur: NEGATIVE mg/dL
NITRITE: NEGATIVE
PH: 6 (ref 5.0–8.0)
PROTEIN: NEGATIVE mg/dL
Specific Gravity, Urine: 1.024 (ref 1.005–1.030)

## 2016-10-21 LAB — URINE MICROSCOPIC-ADD ON: RBC / HPF: NONE SEEN RBC/hpf (ref 0–5)

## 2016-10-21 LAB — CBC
HEMATOCRIT: 30.6 % — AB (ref 36.0–46.0)
Hemoglobin: 9.6 g/dL — ABNORMAL LOW (ref 12.0–15.0)
MCH: 23.3 pg — ABNORMAL LOW (ref 26.0–34.0)
MCHC: 31.4 g/dL (ref 30.0–36.0)
MCV: 74.3 fL — AB (ref 78.0–100.0)
PLATELETS: 277 10*3/uL (ref 150–400)
RBC: 4.12 MIL/uL (ref 3.87–5.11)
RDW: 15.9 % — ABNORMAL HIGH (ref 11.5–15.5)
WBC: 4.3 10*3/uL (ref 4.0–10.5)

## 2016-10-21 LAB — MRSA PCR SCREENING: MRSA BY PCR: NEGATIVE

## 2016-10-21 LAB — PROTIME-INR
INR: 2.47
PROTHROMBIN TIME: 27.2 s — AB (ref 11.4–15.2)

## 2016-10-21 MED ORDER — INSULIN REGULAR BOLUS VIA INFUSION
0.0000 [IU] | Freq: Three times a day (TID) | INTRAVENOUS | Status: DC
  Filled 2016-10-21: qty 10

## 2016-10-21 MED ORDER — SODIUM CHLORIDE 0.9 % IV BOLUS (SEPSIS)
1000.0000 mL | Freq: Once | INTRAVENOUS | Status: AC
  Administered 2016-10-21: 1000 mL via INTRAVENOUS

## 2016-10-21 MED ORDER — ACETAMINOPHEN 325 MG PO TABS: 650.0000 mg | ORAL_TABLET | Freq: Four times a day (QID) | ORAL | Status: DC | PRN

## 2016-10-21 MED ORDER — ONDANSETRON HCL 4 MG/2ML IJ SOLN: 4.0000 mg | Freq: Four times a day (QID) | INTRAMUSCULAR | Status: DC | PRN

## 2016-10-21 MED ORDER — SODIUM CHLORIDE 0.9 % IV SOLN
INTRAVENOUS | Status: DC
  Administered 2016-10-21: 23:00:00 via INTRAVENOUS

## 2016-10-21 MED ORDER — SODIUM CHLORIDE 0.9 % IV SOLN
INTRAVENOUS | Status: DC
  Administered 2016-10-21: 20:00:00 via INTRAVENOUS
  Filled 2016-10-21: qty 2.5

## 2016-10-21 MED ORDER — SODIUM CHLORIDE 0.9 % IV SOLN
INTRAVENOUS | Status: DC
  Administered 2016-10-21: 0.8 [IU]/h via INTRAVENOUS
  Filled 2016-10-21: qty 2.5

## 2016-10-21 MED ORDER — WARFARIN SODIUM 5 MG PO TABS
7.5000 mg | ORAL_TABLET | Freq: Once | ORAL | Status: AC
  Administered 2016-10-21: 7.5 mg via ORAL
  Filled 2016-10-21: qty 1

## 2016-10-21 MED ORDER — ACETAMINOPHEN 650 MG RE SUPP: 650.0000 mg | Freq: Four times a day (QID) | RECTAL | Status: DC | PRN

## 2016-10-21 MED ORDER — ONDANSETRON HCL 4 MG PO TABS: 4.0000 mg | ORAL_TABLET | Freq: Four times a day (QID) | ORAL | Status: DC | PRN

## 2016-10-21 MED ORDER — TRAMADOL HCL 50 MG PO TABS: 50.0000 mg | ORAL_TABLET | Freq: Four times a day (QID) | ORAL | Status: DC | PRN

## 2016-10-21 MED ORDER — CITALOPRAM HYDROBROMIDE 20 MG PO TABS
40.0000 mg | ORAL_TABLET | Freq: Every day | ORAL | Status: DC
  Administered 2016-10-22 – 2016-10-24 (×3): 40 mg via ORAL
  Filled 2016-10-21 (×3): qty 2

## 2016-10-21 MED ORDER — MELATONIN 3 MG PO TABS: 3.0000 mg | ORAL_TABLET | Freq: Every evening | ORAL | Status: DC | PRN

## 2016-10-21 MED ORDER — SODIUM CHLORIDE 0.9 % IV SOLN
INTRAVENOUS | Status: DC
  Administered 2016-10-21 – 2016-10-22 (×2): via INTRAVENOUS
  Administered 2016-10-22: 1000 mL via INTRAVENOUS

## 2016-10-21 MED ORDER — SODIUM CHLORIDE 0.9 % IV SOLN: INTRAVENOUS | Status: DC

## 2016-10-21 MED ORDER — PRAVASTATIN SODIUM 20 MG PO TABS
20.0000 mg | ORAL_TABLET | Freq: Every evening | ORAL | Status: DC
Start: 2016-10-21 — End: 2016-10-24
  Administered 2016-10-21 – 2016-10-23 (×3): 20 mg via ORAL
  Filled 2016-10-21 (×3): qty 1

## 2016-10-21 MED ORDER — PANTOPRAZOLE SODIUM 40 MG PO TBEC
40.0000 mg | DELAYED_RELEASE_TABLET | Freq: Every day | ORAL | Status: DC
  Administered 2016-10-22 – 2016-10-24 (×3): 40 mg via ORAL
  Filled 2016-10-21 (×3): qty 1

## 2016-10-21 MED ORDER — DEXTROSE-NACL 5-0.45 % IV SOLN: INTRAVENOUS | Status: DC

## 2016-10-21 MED ORDER — VITAMIN D (ERGOCALCIFEROL) 1.25 MG (50000 UNIT) PO CAPS
50000.0000 [IU] | ORAL_CAPSULE | ORAL | Status: DC
  Administered 2016-10-24: 50000 [IU] via ORAL
  Filled 2016-10-21: qty 1

## 2016-10-21 MED ORDER — WARFARIN - PHARMACIST DOSING INPATIENT: Freq: Every day | Status: DC

## 2016-10-21 MED ORDER — DOCUSATE SODIUM 100 MG PO CAPS
100.0000 mg | ORAL_CAPSULE | Freq: Two times a day (BID) | ORAL | Status: DC
  Administered 2016-10-21 – 2016-10-24 (×5): 100 mg via ORAL
  Filled 2016-10-21 (×5): qty 1

## 2016-10-21 MED ORDER — DEXTROSE 50 % IV SOLN: 25.0000 mL | INTRAVENOUS | Status: DC | PRN

## 2016-10-21 MED ORDER — DEXTROSE-NACL 5-0.45 % IV SOLN
INTRAVENOUS | Status: DC
  Administered 2016-10-21: via INTRAVENOUS

## 2016-10-21 NOTE — ED Provider Notes (Signed)
[facility]-EMERGENCY DEPT Provider Note   CSN: [id number] Arrival date & time: 10/21/16  1749     History   Chief Complaint Chief Complaint  Patient presents with  . Hyperglycemia    HPI Madison [name] is a 55 y.o. female.  The history is provided by the patient. No language interpreter was used.  Hyperglycemia    Madison [name] is a 55 y.o. female who presents to the Emergency Department complaining of hyperglycemia. She reports elevated blood sugars for the last 3 days. Other than feeling thirsty she has no associated symptoms. She denies any fevers, chest pain, shortness breath, vomiting, nausea, vomiting. She states that she is eating the meals that she is served at the nursing home and ate a chili dog yesterday. She denies drinking any sugary beverages. She is taking her insulin as provided at the nursing facility.   Past Medical History:  Diagnosis Date  . Allergy   . Anxiety   . Aortic stenosis   . CAD (coronary artery disease)   . CHF (congestive heart failure) (HCC)   . Depression   . Diabetes (HCC) dx'd 1982   "went straight to insulin"  . Dyslipidemia   . GERD (gastroesophageal reflux disease)   . Heart murmur   . HTN (hypertension)   . Hypercholesteremia   . Myocardial infarction 12/2014  . Obesity   . Pneumonia 11/2014; 12/2014  . Rheumatoid arthritis(714.0)    "hands" (07/01/2015)  . Stroke syndrome [facility]) 1995; 2001   "when my son was born; problems w/speech and L hand since then" (01/19/2015)  . Thrombophlebitis     Patient Active Problem List   Diagnosis Date Noted  . Type 2 diabetes mellitus with hyperosmolar nonketotic hyperglycemia (HCC) 10/21/2016  . Left ear pain 07/01/2016  . Hyperglycemia 06/06/2016  . Malnutrition of moderate degree 06/04/2016  . Septic shock (HCC) 06/04/2016  . SIRS (systemic inflammatory response syndrome) (HCC) 05/25/2016  . Type 1 diabetes mellitus with ketoacidosis without coma (HCC)   . MDD (major depressive disorder),  recurrent episode, moderate (HCC)   . Aspiration pneumonia (HCC) 05/17/2016  . Diabetic ketoacidosis without coma (HCC)   . Sepsis (HCC)   . 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 MSOF (multiple systems organ failure) 05/01/2016  . Acute hypoxemic respiratory failure (HCC)   . Type I diabetes mellitus with complication, uncontrolled (HCC) 03/29/2016  . Adjustment disorder with mixed anxiety and depressed mood 03/28/2016  . HLD (hyperlipidemia) 03/27/2016  . AKI (acute kidney injury) (HCC) 03/27/2016  . Chronic combined systolic and diastolic congestive heart failure (HCC)   . Diabetic ketoacidosis (HCC) 03/26/2016  . Type 1 diabetes, HbA1c goal < 7% (HCC)   . Hypotension 03/08/2016  . Uncontrolled type 1 diabetes mellitus with ketoacidotic coma (HCC)   . Acute encephalopathy 02/28/2016  . Hyponatremia: Pseudo 02/28/2016  . Lactic acidosis   . Diabetic ketoacidosis with coma associated with type 1 diabetes mellitus (HCC)   . Hypoglycemia   . Chronic diastolic heart failure (HCC)   . Esophageal reflux   . S/P AVR (aortic valve replacement)   . ARF (acute renal failure) (HCC) 01/22/2016  . CHF (congestive heart failure) (HCC) 01/22/2016  . Diabetic ketoacidosis without coma associated with type 1 diabetes mellitus (HCC)   . DKA, type 1 (HCC) 11/23/2015  . Constipation 11/23/2015  . Leukocytosis   . Pressure ulcer 08/02/2015  . Diabetic ketoacidosis with coma associated with other specified diabetes mellitus (HCC)   . Closed left ankle  fracture 07/01/2015  . Trimalleolar fracture of left ankle 07/01/2015  . Hyperkalemia   . Insomnia 06/16/2015  . Normocytic anemia 01/31/2015  . Acute on chronic combined systolic and diastolic CHF (congestive heart failure) (HCC)   . Essential hypertension   . Elevated troponin   . Diabetes type 1, uncontrolled (HCC)   . Demand ischemia (HCC)   . S/P aortic valve replacement   . Hypokalemia   . NSTEMI (non-ST elevated myocardial  infarction) (HCC) 12/08/2014  . Blood poisoning (HCC)   . Supratherapeutic INR   . Severe sepsis with acute organ dysfunction (HCC) 12/03/2014  . Spastic hemiplegia affecting nondominant side (HCC) 11/29/2014  . Dysphagia, pharyngoesophageal phase 10/04/2014  . Sinus tachycardia (HCC) 09/30/2014  . Depression 04/29/2014  . CVA (cerebral infarction) 03/12/2014  . Acute respiratory failure (HCC) 03/12/2014  . Chronic anticoagulation 07/28/2013  . Long term (current) use of anticoagulants 07/28/2013  . CAD (coronary artery disease)   . Aortic stenosis   . Hyperlipidemia   . Rheumatoid arthritis [facility])     Past Surgical History:  Procedure Laterality Date  . AORTIC VALVE REPLACEMENT (AVR)/CORONARY ARTERY BYPASS GRAFTING (CABG)  "2014"   [name] 03/08/2014  . CARDIAC CATHETERIZATION  12/08/2014  . CATARACT EXTRACTION Left   . CESAREAN SECTION  1995  . CORONARY ANGIOPLASTY WITH STENT PLACEMENT  12/09/2014  . CORONARY ARTERY BYPASS GRAFT    . FOOT FRACTURE SURGERY    . FRACTURE SURGERY    . KNEE ARTHROSCOPY Right   . LEFT HEART CATHETERIZATION WITH CORONARY ANGIOGRAM N/A 12/08/2014   Procedure: LEFT HEART CATHETERIZATION WITH CORONARY ANGIOGRAM;  Surgeon: Iran Ouch, MD;  Location: [facility] CATH LAB;  Service: Cardiovascular;  Laterality: N/A;  . NEUROPLASTY / TRANSPOSITION MEDIAN NERVE AT CARPAL TUNNEL    . ORIF ANKLE FRACTURE Left 07/04/2015   Procedure: OPEN REDUCTION INTERNAL FIXATION (ORIF)  TRIMAL ANKLE FRACTURE;  Surgeon: [name], MD;  Location: [facility] OR;  Service: Orthopedics;  Laterality: Left;  . PERCUTANEOUS CORONARY STENT INTERVENTION (PCI-S) N/A 12/09/2014   Procedure: PERCUTANEOUS CORONARY STENT INTERVENTION (PCI-S);  Surgeon: [name], MD;  Location: [facility] CATH LAB;  Service: Cardiovascular;  Laterality: N/A;  . TUBAL LIGATION  1995    OB History    No data available       Home Medications    Prior to Admission medications   Medication Sig Start Date End Date  Taking? Authorizing Provider  citalopram (CELEXA) 40 MG tablet Take 1 tablet (40 mg total) by mouth daily. 03/08/16  Yes [name], NP  docusate sodium (COLACE) 100 MG capsule Take 100 mg by mouth 2 (two) times daily.   Yes Historical Provider, MD  ergocalciferol (VITAMIN D2) 50000 units capsule Take 50,000 Units by mouth once a week. Pt takes on Wednesday.   Yes Historical Provider, MD  furosemide (LASIX) 20 MG tablet Take 1 tablet (20 mg total) by mouth daily. 05/22/16  Yes Zannie Cove, MD  insulin aspart (NOVOLOG) 100 UNIT/ML injection Inject 2-10 Units into the skin 2 (two) times daily. Sliding scale: 121-150=2u, 151-200=3u, 201- 250=5u, 251-300=6u, 301-350= 7u, >350=10u  .  Call MD for BS >400 or <60   Yes Historical Provider, MD  Insulin Detemir (LEVEMIR FLEXTOUCH) 100 UNIT/ML Pen Inject 10 Units into the skin 2 (two) times daily.   Yes Historical Provider, MD  Melatonin 3 MG TABS Take 3 mg by mouth at bedtime as needed (for sleep).   Yes Historical Provider, MD  omeprazole (PRILOSEC)  40 MG capsule Take 40 mg by mouth daily before breakfast.   Yes Historical Provider, MD  pravastatin (PRAVACHOL) 20 MG tablet Take 20 mg by mouth every evening.   Yes Historical Provider, MD  traMADol (ULTRAM) 50 MG tablet Take 50 mg by mouth every 6 (six) hours as needed for moderate pain or severe pain.   Yes Historical Provider, MD  warfarin (COUMADIN) 5 MG tablet Take 7.5 mg by mouth daily. Stop 10/21/16 per [facility]   Yes Historical Provider, MD  cephALEXin (KEFLEX) 500 MG capsule Take 1 capsule (500 mg total) by mouth 2 (two) times daily. Patient not taking: Reported on 10/21/2016 07/31/16   Garlon Hatchet, PA-C  Nutritional Supplements (NUTRITIONAL DRINK PO) Take 120 mLs by mouth 3 (three) times daily.     Historical Provider, MD    Family History Family History  Problem Relation Age of Onset  . Heart disease Mother   . Heart disease Sister   . Stroke      Social History Social History  Substance Use  Topics  . Smoking status: Never Smoker  . Smokeless tobacco: Never Used  . Alcohol use No     Allergies   Adhesive [tape]; Celebrex [celecoxib]; and Detrol [tolterodine]   Review of Systems Review of Systems  All other systems reviewed and are negative.    Physical Exam Updated Vital Signs BP 118/58 (BP Location: Left Arm)   Pulse 71   Temp 97.7 F (36.5 C) (Oral)   Resp 20   SpO2 98%   Physical Exam  Constitutional: She is oriented to person, place, and time. She appears well-developed and well-nourished.  HENT:  Head: Normocephalic and atraumatic.  Dry mucous membranes  Cardiovascular: Normal rate and regular rhythm.   Murmur heard. Pulmonary/Chest: Effort normal and breath sounds normal. No respiratory distress.  Abdominal: Soft. There is no tenderness. There is no rebound and no guarding.  Musculoskeletal: She exhibits no edema or tenderness.  Neurological: She is alert and oriented to person, place, and time.  Left upper extremity weakness.  Skin: Skin is warm and dry.  Psychiatric: She has a normal mood and affect. Her behavior is normal.  Nursing note and vitals reviewed.    ED Treatments / Results  Labs (all labs ordered are listed, but only abnormal results are displayed) Labs Reviewed  BASIC METABOLIC PANEL - Abnormal; Notable for the following:       Result Value   Sodium 118 (*)    Potassium 5.2 (*)    Chloride 85 (*)    Glucose, Bld 1,019 (*)    BUN 26 (*)    Creatinine, Ser 1.42 (*)    Calcium 8.5 (*)    GFR calc non Af Amer 41 (*)    GFR calc Af Amer 47 (*)    All other components within normal limits  CBC - Abnormal; Notable for the following:    Hemoglobin 9.6 (*)    HCT 30.6 (*)    MCV 74.3 (*)    MCH 23.3 (*)    RDW 15.9 (*)    All other components within normal limits  URINALYSIS, ROUTINE W REFLEX MICROSCOPIC (NOT AT [facility]) - Abnormal; Notable for the following:    Glucose, UA >1000 (*)    Leukocytes, UA SMALL (*)    All other  components within normal limits  PROTIME-INR - Abnormal; Notable for the following:    Prothrombin Time 27.2 (*)    All other components within normal limits  URINE MICROSCOPIC-ADD ON - Abnormal; Notable for the following:    Squamous Epithelial / LPF 0-5 (*)    Bacteria, UA RARE (*)    All other components within normal limits  GLUCOSE, CAPILLARY - Abnormal; Notable for the following:    Glucose-Capillary 463 (*)    All other components within normal limits  GLUCOSE, CAPILLARY - Abnormal; Notable for the following:    Glucose-Capillary 279 (*)    All other components within normal limits  CBG MONITORING, ED - Abnormal; Notable for the following:    Glucose-Capillary >600 (*)    All other components within normal limits  CBG MONITORING, ED - Abnormal; Notable for the following:    Glucose-Capillary >600 (*)    All other components within normal limits  MRSA PCR SCREENING  BLOOD GAS, VENOUS  BASIC METABOLIC PANEL  BASIC METABOLIC PANEL  BASIC METABOLIC PANEL  CBC WITH DIFFERENTIAL/PLATELET  HEMOGLOBIN A1C  TROPONIN I    EKG  EKG Interpretation None       Radiology Dg Chest 2 View  Result Date: 10/21/2016 CLINICAL DATA:  Hyperglycemia. EXAM: CHEST  2 VIEW COMPARISON:  06/01/2016. FINDINGS: Trachea is midline. Heart size normal. Lungs are clear. No pleural fluid. IMPRESSION: No acute findings. Electronically Signed   By: Leanna Battles M.D.   On: 10/21/2016 19:06    Procedures Procedures (including critical care time)  Medications Ordered in ED Medications  traMADol (ULTRAM) tablet 50 mg (not administered)  docusate sodium (COLACE) capsule 100 mg (not administered)  Vitamin D (Ergocalciferol) (DRISDOL) capsule 50,000 Units (not administered)  pantoprazole (PROTONIX) EC tablet 40 mg (not administered)  pravastatin (PRAVACHOL) tablet 20 mg (not administered)  citalopram (CELEXA) tablet 40 mg (not administered)  acetaminophen (TYLENOL) tablet 650 mg (not administered)     Or  acetaminophen (TYLENOL) suppository 650 mg (not administered)  ondansetron (ZOFRAN) tablet 4 mg (not administered)    Or  ondansetron (ZOFRAN) injection 4 mg (not administered)  insulin regular bolus via infusion 0-10 Units (not administered)  insulin regular (NOVOLIN R,HUMULIN R) 250 Units in sodium chloride 0.9 % 250 mL (1 Units/mL) infusion (not administered)  dextrose 50 % solution 25 mL (not administered)  0.9 %  sodium chloride infusion (not administered)  0.9 %  sodium chloride infusion (not administered)  sodium chloride 0.9 % bolus 1,000 mL (0 mLs Intravenous Stopped 10/21/16 2008)  sodium chloride 0.9 % bolus 1,000 mL (1,000 mLs Intravenous New Bag/Given 10/21/16 2010)  sodium chloride 0.9 % bolus 1,000 mL (1,000 mLs Intravenous Given 10/21/16 2149)     Initial Impression / Assessment and Plan / ED Course  I have reviewed the triage vital signs and the nursing notes.  Pertinent labs & imaging results that were available during my care of the patient were reviewed by me and considered in my medical decision making (see chart for details).  Clinical Course     Patient here from nursing home for evaluation of elevated blood sugars, critical high for the last 3 days. She is dehydrated on examination but in no acute distress. Blood sugar is greater than 1000 with hyponatremia, mild dehydration on labs. Treating with IV fluids and glucostabilizer. Plan to admit to the hospitalist service for further treatment of her severe hyperglycemia.  Final Clinical Impressions(s) / ED Diagnoses   Final diagnoses:  Hyperglycemia    New Prescriptions Current Discharge Medication List       Tilden Fossa, MD 10/21/16 [phone]

## 2016-10-21 NOTE — Progress Notes (Signed)
ANTICOAGULATION CONSULT NOTE - Initial Consult  Pharmacy Consult for Warfarin Indication: mechanical heart valve  Allergies  Allergen Reactions  . Adhesive [Tape] Other (See Comments)    Reaction:  Burning   . Celebrex [Celecoxib] Rash  . Detrol [Tolterodine] Hives    Patient Measurements:     Vital Signs: Temp: 97.7 F (36.5 C) (12/03 2045) Temp Source: Oral (12/03 2045) BP: 118/58 (12/03 2045) Pulse Rate: 71 (12/03 2300)  Labs:  Recent Labs  10/21/16 1816 10/21/16 1824  HGB 9.6*  --   HCT 30.6*  --   PLT 277  --   LABPROT  --  27.2*  INR  --  2.47  CREATININE 1.42*  --     CrCl cannot be calculated (Unknown ideal weight.).   Medical History: Past Medical History:  Diagnosis Date  . Allergy   . Anxiety   . Aortic stenosis   . CAD (coronary artery disease)   . CHF (congestive heart failure) (HCC)   . Depression   . Diabetes (HCC) dx'd 1982   "went straight to insulin"  . Dyslipidemia   . GERD (gastroesophageal reflux disease)   . Heart murmur   . HTN (hypertension)   . Hypercholesteremia   . Myocardial infarction 12/2014  . Obesity   . Pneumonia 11/2014; 12/2014  . Rheumatoid arthritis(714.0)    "hands" (07/01/2015)  . Stroke syndrome [facility]) 1995; 2001   "when my son was born; problems w/speech and L hand since then" (01/19/2015)  . Thrombophlebitis     Assessment:  55 yr female admitted with hyperglycemia.   PMH significant for mechanical aortic valve replacement  PTA on warfarin 7.5mg  daily with last dose reported as taken on 12/2 @ 17:00  INR upon admission = 2.47 (slightly subtherapeutic)  Goal of Therapy:  INR 2.5-3.5   Plan:  Warfarin 7.5mg  po x 1 dose tonight Check daily PT/INR  Brewer Hitchman, Joselyn Glassman, PharmD 10/21/2016,11:44 PM

## 2016-10-21 NOTE — ED Notes (Signed)
Bed: WA04 Expected date:  Expected time:  Means of arrival:  Comments: 55 yo hyperglycemia >600

## 2016-10-21 NOTE — ED Notes (Signed)
No respiratory or acute distress noted alert and oriented x 3 call light in reach eating meal ok by provider low carb diet no pain voiced.

## 2016-10-21 NOTE — H&P (Signed)
History and Physical    Madison [name] JME:[id number] DOB: 12-30-1960 DOA: 10/21/2016  PCP: [name], NP  Patient coming from: Nursing home.  Chief Complaint: Elevated blood sugar.  HPI: Madison [name] is a 55 y.o. female with diabetes mellitus type 2 on Levemir, CAD status post stenting, mechanical aortic valve on Coumadin and history of CVA with left-sided hemiparesis was brought to the ER after the patient was found to be having persistently elevated blood sugar over the last 3 days. Patient states she has not missed her medications and has not changed her diet. Denies any chest pain shortness of breath nausea vomiting diarrhea or abdominal pain. Has no signs of infection chest x-ray and UA were unremarkable and there is no skin rash and patient is afebrile. Patient's blood sugar was found to be in the thousands with anion gap of 10. Patient was started on fluid bolus and insulin infusion and admitted for hyperosmolar nonketotic type 2 diabetes mellitus state.   ED Course: Fluid bolus was given in the ER 2 L and started on IV insulin infusion.  Review of Systems: As per HPI, rest all negative.   Past Medical History:  Diagnosis Date  . Allergy   . Anxiety   . Aortic stenosis   . CAD (coronary artery disease)   . CHF (congestive heart failure) (HCC)   . Depression   . Diabetes (HCC) dx'd 1982   "went straight to insulin"  . Dyslipidemia   . GERD (gastroesophageal reflux disease)   . Heart murmur   . HTN (hypertension)   . Hypercholesteremia   . Myocardial infarction 12/2014  . Obesity   . Pneumonia 11/2014; 12/2014  . Rheumatoid arthritis(714.0)    "hands" (07/01/2015)  . Stroke syndrome [facility]) 1995; 2001   "when my son was born; problems w/speech and L hand since then" (01/19/2015)  . Thrombophlebitis     Past Surgical History:  Procedure Laterality Date  . AORTIC VALVE REPLACEMENT (AVR)/CORONARY ARTERY BYPASS GRAFTING (CABG)  "2014"   [name] 03/08/2014  . CARDIAC  CATHETERIZATION  12/08/2014  . CATARACT EXTRACTION Left   . CESAREAN SECTION  1995  . CORONARY ANGIOPLASTY WITH STENT PLACEMENT  12/09/2014  . CORONARY ARTERY BYPASS GRAFT    . FOOT FRACTURE SURGERY    . FRACTURE SURGERY    . KNEE ARTHROSCOPY Right   . LEFT HEART CATHETERIZATION WITH CORONARY ANGIOGRAM N/A 12/08/2014   Procedure: LEFT HEART CATHETERIZATION WITH CORONARY ANGIOGRAM;  Surgeon: Iran Ouch, MD;  Location: [facility] CATH LAB;  Service: Cardiovascular;  Laterality: N/A;  . NEUROPLASTY / TRANSPOSITION MEDIAN NERVE AT CARPAL TUNNEL    . ORIF ANKLE FRACTURE Left 07/04/2015   Procedure: OPEN REDUCTION INTERNAL FIXATION (ORIF)  TRIMAL ANKLE FRACTURE;  Surgeon: [name], MD;  Location: [facility] OR;  Service: Orthopedics;  Laterality: Left;  . PERCUTANEOUS CORONARY STENT INTERVENTION (PCI-S) N/A 12/09/2014   Procedure: PERCUTANEOUS CORONARY STENT INTERVENTION (PCI-S);  Surgeon: [name], MD;  Location: [facility] CATH LAB;  Service: Cardiovascular;  Laterality: N/A;  . TUBAL LIGATION  1995     reports that she has never smoked. She has never used smokeless tobacco. She reports that she does not drink alcohol or use drugs.  Allergies  Allergen Reactions  . Adhesive [Tape] Other (See Comments)    Reaction:  Burning   . Celebrex [Celecoxib] Rash  . Detrol [Tolterodine] Hives    Family History  Problem Relation Age of Onset  . Heart disease Mother   .  Heart disease Sister   . Stroke      Prior to Admission medications   Medication Sig Start Date End Date Taking? Authorizing Provider  citalopram (CELEXA) 40 MG tablet Take 1 tablet (40 mg total) by mouth daily. 03/08/16  Yes [name], NP  docusate sodium (COLACE) 100 MG capsule Take 100 mg by mouth 2 (two) times daily.   Yes Historical Provider, MD  ergocalciferol (VITAMIN D2) 50000 units capsule Take 50,000 Units by mouth once a week. Pt takes on Wednesday.   Yes Historical Provider, MD  furosemide (LASIX) 20 MG tablet Take 1 tablet  (20 mg total) by mouth daily. 05/22/16  Yes Zannie Cove, MD  insulin aspart (NOVOLOG) 100 UNIT/ML injection Inject 2-10 Units into the skin 2 (two) times daily. Sliding scale: 121-150=2u, 151-200=3u, 201- 250=5u, 251-300=6u, 301-350= 7u, >350=10u  .  Call MD for BS >400 or <60   Yes Historical Provider, MD  Insulin Detemir (LEVEMIR FLEXTOUCH) 100 UNIT/ML Pen Inject 10 Units into the skin 2 (two) times daily.   Yes Historical Provider, MD  Melatonin 3 MG TABS Take 3 mg by mouth at bedtime as needed (for sleep).   Yes Historical Provider, MD  omeprazole (PRILOSEC) 40 MG capsule Take 40 mg by mouth daily before breakfast.   Yes Historical Provider, MD  pravastatin (PRAVACHOL) 20 MG tablet Take 20 mg by mouth every evening.   Yes Historical Provider, MD  traMADol (ULTRAM) 50 MG tablet Take 50 mg by mouth every 6 (six) hours as needed for moderate pain or severe pain.   Yes Historical Provider, MD  warfarin (COUMADIN) 5 MG tablet Take 7.5 mg by mouth daily. Stop 10/21/16 per [facility]   Yes Historical Provider, MD  cephALEXin (KEFLEX) 500 MG capsule Take 1 capsule (500 mg total) by mouth 2 (two) times daily. Patient not taking: Reported on 10/21/2016 07/31/16   Garlon Hatchet, PA-C  Nutritional Supplements (NUTRITIONAL DRINK PO) Take 120 mLs by mouth 3 (three) times daily.     Historical Provider, MD    Physical Exam: Vitals:   10/21/16 2000 10/21/16 2045 10/21/16 2100 10/21/16 2200  BP:  118/58    Pulse: 76 75 74 73  Resp: 19 16 17 17   Temp:  97.7 F (36.5 C)    TempSrc:  Oral    SpO2: 100% 96% 100% 97%      Constitutional: Moderately built and nourished. Vitals:   10/21/16 2000 10/21/16 2045 10/21/16 2100 10/21/16 2200  BP:  118/58    Pulse: 76 75 74 73  Resp: 19 16 17 17   Temp:  97.7 F (36.5 C)    TempSrc:  Oral    SpO2: 100% 96% 100% 97%   Eyes: Anicteric no pallor. ENMT: No discharge from the ears eyes nose or mouth. Neck: No mass felt. No neck rigidity. Respiratory: No rhonchi or  crepitations. Cardiovascular: S1 and S2 heard. No murmurs appreciated. Abdomen: Soft nontender bowel sounds present. No guarding or rigidity. Musculoskeletal: No edema. No joint effusion. Skin: No rash. Skin appears warm. Neurologic: Alert awake oriented to time place and person. Moves all extremities 5 x 5. Psychiatric: Appears normal. Normal exam.   Labs on Admission: I have personally reviewed following labs and imaging studies  CBC:  Recent Labs Lab 10/21/16 1816  WBC 4.3  HGB 9.6*  HCT 30.6*  MCV 74.3*  PLT 277   Basic Metabolic Panel:  Recent Labs Lab 10/21/16 1816  NA 118*  K 5.2*  CL 85*  CO2 23  GLUCOSE 1,019*  BUN 26*  CREATININE 1.42*  CALCIUM 8.5*   GFR: CrCl cannot be calculated (Unknown ideal weight.). Liver Function Tests: No results for input(s): AST, ALT, ALKPHOS, BILITOT, PROT, ALBUMIN in the last 168 hours. No results for input(s): LIPASE, AMYLASE in the last 168 hours. No results for input(s): AMMONIA in the last 168 hours. Coagulation Profile:  Recent Labs Lab 10/21/16 1824  INR 2.47   Cardiac Enzymes: No results for input(s): CKTOTAL, CKMB, CKMBINDEX, TROPONINI in the last 168 hours. BNP (last 3 results) No results for input(s): PROBNP in the last 8760 hours. HbA1C: No results for input(s): HGBA1C in the last 72 hours. CBG:  Recent Labs Lab 10/21/16 1817 10/21/16 2038 10/21/16 2146 10/21/16 2242  GLUCAP >600* >600* 463* 279*   Lipid Profile: No results for input(s): CHOL, HDL, LDLCALC, TRIG, CHOLHDL, LDLDIRECT in the last 72 hours. Thyroid Function Tests: No results for input(s): TSH, T4TOTAL, FREET4, T3FREE, THYROIDAB in the last 72 hours. Anemia Panel: No results for input(s): VITAMINB12, FOLATE, FERRITIN, TIBC, IRON, RETICCTPCT in the last 72 hours. Urine analysis:    Component Value Date/Time   COLORURINE YELLOW 10/21/2016 1816   APPEARANCEUR CLEAR 10/21/2016 1816   LABSPEC 1.024 10/21/2016 1816   PHURINE 6.0  10/21/2016 1816   GLUCOSEU >1000 (A) 10/21/2016 1816   HGBUR NEGATIVE 10/21/2016 1816   BILIRUBINUR NEGATIVE 10/21/2016 1816   BILIRUBINUR n 06/16/2015 1244   KETONESUR NEGATIVE 10/21/2016 1816   PROTEINUR NEGATIVE 10/21/2016 1816   UROBILINOGEN 0.2 07/30/2015 1415   NITRITE NEGATIVE 10/21/2016 1816   LEUKOCYTESUR SMALL (A) 10/21/2016 1816   Sepsis Labs: @LABRCNTIP (procalcitonin:4,lacticidven:4) )No results found for this or any previous visit (from the past 240 hour(s)).   Radiological Exams on Admission: Dg Chest 2 View  Result Date: 10/21/2016 CLINICAL DATA:  Hyperglycemia. EXAM: CHEST  2 VIEW COMPARISON:  06/01/2016. FINDINGS: Trachea is midline. Heart size normal. Lungs are clear. No pleural fluid. IMPRESSION: No acute findings. Electronically Signed   By: Leanna Battles M.D.   On: 10/21/2016 19:06    EKG: Independently reviewed. Normal sinus rhythm.  Assessment/Plan Principal Problem:   Type 2 diabetes mellitus with hyperosmolar nonketotic hyperglycemia (HCC) Active Problems:   CAD (coronary artery disease)   Essential hypertension   S/P AVR (aortic valve replacement)   AKI (acute kidney injury) (HCC)   Hyperglycemia    1. Hyperosmolar nonketotic diabetes mellitus type 2 state - not sure of the precipitating cause. Patient will be hydrated aggressively (patient does have a history of diastolic CHF so caution) and on IV insulin infusion until blood sugar is less than 250. Closely follow metabolic panel. Will change to patient's Levemir dose and moderate dose sliding scale month blood sugar is less than 250. Check hemoglobin A1c. 2. Acute renal failure and hyponatremia - probably from dehydration. I think hyponatremia a pseudohyponatremia from hyperglycemia. Closely follow metabolic panel intake output. Will hold Lasix for now. 3. Diastolic CHF with last EF measured in June 2017 was 60-65% with grade 1 diastolic dysfunction - due to dehydration patient's Lasix on hold.  Closely follow respiratory status. 4. CAD status post stenting - denies any chest pain. Continue statins and is on Coumadin. 5. Mechanical aortic valve - Coumadin per pharmacy. 6. History of CVA with left-sided hemiparesis - on Coumadin and statins.   DVT prophylaxis: Coumadin. Code Status: Full code.  Family Communication: Discussed with patient.  Disposition Plan: Nursing home.  Consults called: None.  Admission status: Observation.  Eduard Clos MD [facility] Pager [phone].  If 7PM-7AM, please contact night-coverage [email or web address] Password [email or web address]  10/21/2016, 11:03 PM

## 2016-10-21 NOTE — Progress Notes (Signed)
PHARMACIST - PHYSICIAN ORDER COMMUNICATION  CONCERNING: P&T Medication Policy on Herbal Medications  DESCRIPTION:  This patient's order for:  Melatonin  has been noted.  This product(s) is classified as an "herbal" or natural product. Due to a lack of definitive safety studies or FDA approval, nonstandard manufacturing practices, plus the potential risk of unknown drug-drug interactions while on inpatient medications, the Pharmacy and Therapeutics Committee does not permit the use of "herbal" or natural products of this type within [place].   ACTION TAKEN: The pharmacy department is unable to verify this order at this time and your patient has been informed of this safety policy. Please reevaluate patient's clinical condition at discharge and address if the herbal or natural product(s) should be resumed at that time.  Janalee Grobe, PharmD  

## 2016-10-22 DIAGNOSIS — Z9842 Cataract extraction status, left eye: Secondary | ICD-10-CM | POA: Diagnosis not present

## 2016-10-22 DIAGNOSIS — I110 Hypertensive heart disease with heart failure: Secondary | ICD-10-CM | POA: Diagnosis present

## 2016-10-22 DIAGNOSIS — Z951 Presence of aortocoronary bypass graft: Secondary | ICD-10-CM | POA: Diagnosis not present

## 2016-10-22 DIAGNOSIS — N179 Acute kidney failure, unspecified: Secondary | ICD-10-CM | POA: Diagnosis present

## 2016-10-22 DIAGNOSIS — I5032 Chronic diastolic (congestive) heart failure: Secondary | ICD-10-CM | POA: Diagnosis present

## 2016-10-22 DIAGNOSIS — Z79899 Other long term (current) drug therapy: Secondary | ICD-10-CM | POA: Diagnosis not present

## 2016-10-22 DIAGNOSIS — I10 Essential (primary) hypertension: Secondary | ICD-10-CM | POA: Diagnosis not present

## 2016-10-22 DIAGNOSIS — Z9851 Tubal ligation status: Secondary | ICD-10-CM | POA: Diagnosis not present

## 2016-10-22 DIAGNOSIS — F329 Major depressive disorder, single episode, unspecified: Secondary | ICD-10-CM | POA: Diagnosis present

## 2016-10-22 DIAGNOSIS — E11649 Type 2 diabetes mellitus with hypoglycemia without coma: Secondary | ICD-10-CM | POA: Diagnosis not present

## 2016-10-22 DIAGNOSIS — Z952 Presence of prosthetic heart valve: Secondary | ICD-10-CM | POA: Diagnosis not present

## 2016-10-22 DIAGNOSIS — E1100 Type 2 diabetes mellitus with hyperosmolarity without nonketotic hyperglycemic-hyperosmolar coma (NKHHC): Secondary | ICD-10-CM | POA: Diagnosis present

## 2016-10-22 DIAGNOSIS — Z7901 Long term (current) use of anticoagulants: Secondary | ICD-10-CM | POA: Diagnosis not present

## 2016-10-22 DIAGNOSIS — E1101 Type 2 diabetes mellitus with hyperosmolarity with coma: Secondary | ICD-10-CM | POA: Diagnosis not present

## 2016-10-22 DIAGNOSIS — E109 Type 1 diabetes mellitus without complications: Secondary | ICD-10-CM | POA: Diagnosis not present

## 2016-10-22 DIAGNOSIS — I2510 Atherosclerotic heart disease of native coronary artery without angina pectoris: Secondary | ICD-10-CM | POA: Diagnosis present

## 2016-10-22 DIAGNOSIS — Z955 Presence of coronary angioplasty implant and graft: Secondary | ICD-10-CM | POA: Diagnosis not present

## 2016-10-22 DIAGNOSIS — E1165 Type 2 diabetes mellitus with hyperglycemia: Secondary | ICD-10-CM | POA: Diagnosis not present

## 2016-10-22 DIAGNOSIS — Z794 Long term (current) use of insulin: Secondary | ICD-10-CM | POA: Diagnosis not present

## 2016-10-22 DIAGNOSIS — E08649 Diabetes mellitus due to underlying condition with hypoglycemia without coma: Secondary | ICD-10-CM | POA: Diagnosis not present

## 2016-10-22 DIAGNOSIS — E860 Dehydration: Secondary | ICD-10-CM | POA: Diagnosis present

## 2016-10-22 DIAGNOSIS — I69354 Hemiplegia and hemiparesis following cerebral infarction affecting left non-dominant side: Secondary | ICD-10-CM | POA: Diagnosis not present

## 2016-10-22 DIAGNOSIS — Z888 Allergy status to other drugs, medicaments and biological substances status: Secondary | ICD-10-CM | POA: Diagnosis not present

## 2016-10-22 LAB — BASIC METABOLIC PANEL
ANION GAP: 7 (ref 5–15)
ANION GAP: 8 (ref 5–15)
ANION GAP: 9 (ref 5–15)
BUN: 22 mg/dL — ABNORMAL HIGH (ref 6–20)
BUN: 22 mg/dL — ABNORMAL HIGH (ref 6–20)
BUN: 20 mg/dL (ref 6–20)
CALCIUM: 8.7 mg/dL — AB (ref 8.9–10.3)
CALCIUM: 8.8 mg/dL — AB (ref 8.9–10.3)
CALCIUM: 8.9 mg/dL (ref 8.9–10.3)
CO2: 24 mmol/L (ref 22–32)
CO2: 28 mmol/L (ref 22–32)
CO2: 25 mmol/L (ref 22–32)
Chloride: 101 mmol/L (ref 101–111)
Chloride: 101 mmol/L (ref 101–111)
Chloride: 102 mmol/L (ref 101–111)
Creatinine, Ser: 0.9 mg/dL (ref 0.44–1.00)
Creatinine, Ser: 0.8 mg/dL (ref 0.44–1.00)
Creatinine, Ser: 0.78 mg/dL (ref 0.44–1.00)
GFR calc Af Amer: >60 mL/min (ref >60)
GFR calc Af Amer: >60 mL/min (ref >60)
GLUCOSE: 104 mg/dL — AB (ref 65–99)
GLUCOSE: 245 mg/dL — AB (ref 65–99)
GLUCOSE: 259 mg/dL — AB (ref 65–99)
POTASSIUM: 3.9 mmol/L (ref 3.5–5.1)
Potassium: 3.9 mmol/L (ref 3.5–5.1)
Potassium: 4.5 mmol/L (ref 3.5–5.1)
SODIUM: 134 mmol/L — AB (ref 135–145)
SODIUM: 135 mmol/L (ref 135–145)
SODIUM: 136 mmol/L (ref 135–145)

## 2016-10-22 LAB — CBC WITH DIFFERENTIAL/PLATELET
BASOS ABS: 0 10*3/uL (ref 0.0–0.1)
BASOS PCT: 1 %
EOS PCT: 2 %
Eosinophils Absolute: 0.1 10*3/uL (ref 0.0–0.7)
HCT: 29.2 % — ABNORMAL LOW (ref 36.0–46.0)
Hemoglobin: 9.3 g/dL — ABNORMAL LOW (ref 12.0–15.0)
Lymphocytes Relative: 49 %
Lymphs Abs: 2.4 10*3/uL (ref 0.7–4.0)
MCH: 23.4 pg — ABNORMAL LOW (ref 26.0–34.0)
MCHC: 31.8 g/dL (ref 30.0–36.0)
MCV: 73.6 fL — ABNORMAL LOW (ref 78.0–100.0)
MONO ABS: 0.4 10*3/uL (ref 0.1–1.0)
Monocytes Relative: 7 %
NEUTROS ABS: 2 10*3/uL (ref 1.7–7.7)
Neutrophils Relative %: 41 %
PLATELETS: 296 10*3/uL (ref 150–400)
RBC: 3.97 MIL/uL (ref 3.87–5.11)
RDW: 15.4 % (ref 11.5–15.5)
WBC: 4.9 10*3/uL (ref 4.0–10.5)

## 2016-10-22 LAB — TROPONIN I

## 2016-10-22 LAB — GLUCOSE, CAPILLARY
GLUCOSE-CAPILLARY: 146 mg/dL — AB (ref 65–99)
GLUCOSE-CAPILLARY: 340 mg/dL — AB (ref 65–99)
GLUCOSE-CAPILLARY: 366 mg/dL — AB (ref 65–99)
GLUCOSE-CAPILLARY: 209 mg/dL — AB (ref 65–99)
GLUCOSE-CAPILLARY: 36 mg/dL — AB (ref 65–99)
Glucose-Capillary: 100 mg/dL — ABNORMAL HIGH (ref 65–99)
Glucose-Capillary: 112 mg/dL — ABNORMAL HIGH (ref 65–99)
Glucose-Capillary: 53 mg/dL — ABNORMAL LOW (ref 65–99)
Glucose-Capillary: 75 mg/dL (ref 65–99)

## 2016-10-22 LAB — PROTIME-INR
INR: 2.57
PROTHROMBIN TIME: 28.1 s — AB (ref 11.4–15.2)

## 2016-10-22 LAB — GLUCOSE, RANDOM: GLUCOSE: 117 mg/dL — AB (ref 65–99)

## 2016-10-22 MED ORDER — WARFARIN SODIUM 5 MG PO TABS
7.5000 mg | ORAL_TABLET | Freq: Once | ORAL | Status: AC
  Administered 2016-10-22: 7.5 mg via ORAL
  Filled 2016-10-22: qty 1

## 2016-10-22 MED ORDER — INSULIN ASPART 100 UNIT/ML ~~LOC~~ SOLN: 0.0000 [IU] | Freq: Three times a day (TID) | SUBCUTANEOUS | Status: DC

## 2016-10-22 MED ORDER — INSULIN ASPART 100 UNIT/ML ~~LOC~~ SOLN: 0.0000 [IU] | Freq: Every day | SUBCUTANEOUS | Status: DC

## 2016-10-22 MED ORDER — DEXTROSE-NACL 5-0.9 % IV SOLN
INTRAVENOUS | Status: DC
Start: 2016-10-22 — End: 2016-10-23
  Administered 2016-10-23: 1000 mL via INTRAVENOUS

## 2016-10-22 MED ORDER — INSULIN ASPART 100 UNIT/ML ~~LOC~~ SOLN
4.0000 [IU] | Freq: Three times a day (TID) | SUBCUTANEOUS | Status: DC
  Administered 2016-10-22 – 2016-10-23 (×3): 4 [IU] via SUBCUTANEOUS

## 2016-10-22 MED ORDER — INSULIN DETEMIR 100 UNIT/ML ~~LOC~~ SOLN
10.0000 [IU] | Freq: Two times a day (BID) | SUBCUTANEOUS | Status: DC
  Administered 2016-10-22 (×2): 10 [IU] via SUBCUTANEOUS
  Filled 2016-10-22 (×3): qty 0.1

## 2016-10-22 MED ORDER — SODIUM CHLORIDE 0.9 % IV SOLN
INTRAVENOUS | Status: DC
  Administered 2016-10-22: 02:00:00 via INTRAVENOUS

## 2016-10-22 MED ORDER — INSULIN DETEMIR 100 UNIT/ML ~~LOC~~ SOLN: 8.0000 [IU] | Freq: Every day | SUBCUTANEOUS | Status: DC

## 2016-10-22 MED ORDER — INSULIN ASPART 100 UNIT/ML ~~LOC~~ SOLN
0.0000 [IU] | Freq: Three times a day (TID) | SUBCUTANEOUS | Status: DC
  Administered 2016-10-22: 15 [IU] via SUBCUTANEOUS

## 2016-10-22 NOTE — Clinical Social Work Note (Signed)
Clinical Social Work Assessment  Patient Details  Name: Madison [name] MRN: [id number] Date of Birth: 1960-11-27  Date of referral:  10/22/16               Reason for consult:  Facility Placement, Discharge Planning                Permission sought to share information with:  Facility Art therapist granted to share information::  Yes, Verbal Permission Granted  Name::        Agency::     Relationship::     Contact Information:     Housing/Transportation Living arrangements for the past 2 months:  [place] of Information:  Patient Patient Interpreter Needed:  None Criminal Activity/Legal Involvement Pertinent to Current Situation/Hospitalization:  No - Comment as needed Significant Relationships:  Siblings Lives with:  Facility Resident Do you feel safe going back to the place where you live?  Yes Need for family participation in patient care:  No (Coment)  Care giving concerns:  No concerns reported at this time.   Social Worker assessment / plan:  Pt hospitalized from [name] where she is a LTC resident with hyperosmolar nonketotic mellitus type 2 state and acute renal failure. CSW met with pt at bedside to assist with d/c planning. Pt reports that likes her placement at [name] and would like to return at d/c. CSW has contacted SNF and clinicals sent for review. Pt is financially unable to hold bed during hospitalization. SNF is aware and will readmit pt if there is an opening at d/c. CSW offered to contact pt's family to provide dc planning update but pt declined. CSW will continue to follow to assist with d/c planning needs.  Employment status:  Disabled (Comment on whether or not currently receiving Disability) Insurance information:  Programmer, applications, Medicaid In [place] PT Recommendations:  Not assessed at this time Information / Referral to community resources:     Patient/Family's Response to care:  Pt plans to return  to SNF at d/c.  Patient/Family's Understanding of and Emotional Response to Diagnosis, Current Treatment, and Prognosis:  Pt reports that she is feeling better and is looking forward to returning to [name]. Pt appreciates CSW assistance with d/c planning.  Emotional Assessment Appearance:  Appears older than stated age Attitude/Demeanor/Rapport:  Other (cooperative) Affect (typically observed):  Accepting, Pleasant, Appropriate Orientation:  Oriented to Self, Oriented to Place, Oriented to  Time, Oriented to Situation Alcohol / Substance use:  Not Applicable Psych involvement (Current and /or in the community):  No (Comment)  Discharge Needs  Concerns to be addressed:  Discharge Planning Concerns Readmission within the last 30 days:  No Current discharge risk:  None Barriers to Discharge:  No Barriers Identified   Luretha Rued, [place] 10/22/2016, 1:22 PM

## 2016-10-22 NOTE — Progress Notes (Addendum)
Inpatient Diabetes Program Recommendations  AACE/ADA: New Consensus Statement on Inpatient Glycemic Control (2015)  Target Ranges:  Prepandial:   less than 140 mg/dL      Peak postprandial:   less than 180 mg/dL (1-2 hours)      Critically ill patients:  140 - 180 mg/dL   Results for Madison [name], Madison [name] (MRN [id number]) as of 10/22/2016 09:01  Ref. Range 10/21/2016 18:16  Sodium Latest Ref Range: 135 - 145 mmol/L 118 (LL)  Potassium Latest Ref Range: 3.5 - 5.1 mmol/L 5.2 (H)  Chloride Latest Ref Range: 101 - 111 mmol/L 85 (L)  CO2 Latest Ref Range: 22 - 32 mmol/L 23  BUN Latest Ref Range: 6 - 20 mg/dL 26 (H)  Creatinine Latest Ref Range: 0.44 - 1.00 mg/dL 1.42 (H)  Calcium Latest Ref Range: 8.9 - 10.3 mg/dL 8.5 (L)  EGFR (Non-African Amer.) Latest Ref Range: >60 mL/min 41 (L)  EGFR (African American) Latest Ref Range: >60 mL/min 47 (L)  Glucose Latest Ref Range: 65 - 99 mg/dL 1,019 (HH)  Anion gap Latest Ref Range: 5 - 15  10    Admit with: Hyperglycemia (glucose 1019 mg/dl)  History: DM, CHF, CVA  SNF DM Meds: Levemir 10 units BID     Novolog 2-10 units TID per SSI  Current Insulin Orders: Levemir 10 units BID      Novolog Moderate Correction Scale/ SSI (0-15 units) TID AC + HS        -Patient well known to the Inpatient Diabetes Program.  This is patient's 16th admission since January of 2017.  -Note patient resides at [facility].  Current A1c pending.    -Note patient transitioned off IV Insulin drip last PM.  Was given 10 units Levemir at 2am and Insulin drip was stopped at 1:30am.      MD- Please consider the following in-hospital insulin adjustments:  1. Reduce Novolog Correction Scale/ SSI to Sensitive scale (0-9 units) TID AC + HS (currently ordered as Moderate scale 0-15 units)  2. Start Novolog Meal Coverage: Novolog 4 units TID with meals (hold if pt eats <50% of meal)        --Will follow patient during hospitalization--  Wyn Quaker RN, MSN,  CDE Diabetes Coordinator Inpatient Glycemic Control Team Team Pager: [phone] (8a-5p)

## 2016-10-22 NOTE — Progress Notes (Signed)
Spoke to MD about transitioning pt off of insulin drip. Verbal order to give levemir 10 units  Pt cbg was 75 before levemir given. Juice was given to pt and cbg came up to 146. Transitioned off the drip with no issues. Pt resting comfortably and not complaining of any pain or nausea. Will continue to monitor.

## 2016-10-22 NOTE — Progress Notes (Signed)
ANTICOAGULATION CONSULT NOTE - Initial Consult  Pharmacy Consult for Warfarin Indication: mechanical heart valve  Allergies  Allergen Reactions  . Adhesive [Tape] Other (See Comments)    Reaction:  Burning   . Celebrex [Celecoxib] Rash  . Detrol [Tolterodine] Hives    Patient Measurements: Height: 5\' 3"  (160 cm) Weight: 141 lb 15.6 oz (64.4 kg) IBW/kg (Calculated) : 52.4   Vital Signs: Temp: 98 F (36.7 C) (12/04 0700) Temp Source: Oral (12/04 0700) BP: 129/55 (12/04 0600) Pulse Rate: 58 (12/04 0600)  Labs:  Recent Labs  10/21/16 1816 10/21/16 1824 10/22/16 0005 10/22/16 0323 10/22/16 0700  HGB 9.6*  --   --  9.3*  --   HCT 30.6*  --   --  29.2*  --   PLT 277  --   --  296  --   LABPROT  --  27.2*  --  28.1*  --   INR  --  2.47  --  2.57  --   CREATININE 1.42*  --  0.90 0.80 0.78  TROPONINI  --   --  <0.03  --   --     Estimated Creatinine Clearance: 71.7 mL/min (by C-G formula based on SCr of 0.78 mg/dL).   Medical History: Past Medical History:  Diagnosis Date  . Allergy   . Anxiety   . Aortic stenosis   . CAD (coronary artery disease)   . CHF (congestive heart failure) (HCC)   . Depression   . Diabetes (HCC) dx'd 1982   "went straight to insulin"  . Dyslipidemia   . GERD (gastroesophageal reflux disease)   . Heart murmur   . HTN (hypertension)   . Hypercholesteremia   . Myocardial infarction 12/2014  . Obesity   . Pneumonia 11/2014; 12/2014  . Rheumatoid arthritis(714.0)    "hands" (07/01/2015)  . Stroke syndrome [facility]) 1995; 2001   "when my son was born; problems w/speech and L hand since then" (01/19/2015)  . Thrombophlebitis     Assessment:  55 yr female admitted with hyperglycemia.   PMH significant for mechanical aortic valve replacement  PTA on warfarin 7.5mg  daily with last dose reported as taken on 12/2 @ 17:00  12/4:   INR this AM therapeutic at low end of range  CBC shows low hgb, stable overnight, plts WNL, no bleeding issues  noted  FLD  No DDI noted  Note higher goal INR  Goal of Therapy:  INR 2.5-3.5   Plan:  Warfarin 7.5mg  po x 1 dose tonight Check daily PT/INR  Thayden Lemire, 14/4, PharmD 10/22/2016,8:14 AM

## 2016-10-22 NOTE — Progress Notes (Signed)
PROGRESS NOTE                                                                                                                                                                                                             Patient Demographics:    Madison [name], is a 55 y.o. female, DOB - 30-Jun-1961, WFU:[id number]  Admit date - 10/21/2016   Admitting Physician Eduard Clos, MD  Outpatient Primary MD for the patient is [name], NP  LOS - 0  Outpatient Specialists:  Chief Complaint  Patient presents with  . Hyperglycemia       Brief Narrative   55 year old female with type 2 diabetes mellitus on insulin, recurrent hospitalization for hyperosmolar nonketotic state/DKA (this is her 17th ADMISSION this year for the same), CAD status post stenting, mechanical aortic valve on Coumadin, history of CVA with left-sided hemiparesis, severe malnutrition with currently resident of SNF was brought to the ED with persistently elevated blood sugar for past 3 days. Patient informs that she has not missed her insulin and her diet has not changed. She does report mild URI symptoms. In the ED her blood glucose was in 1000 with anion gap of 10. She was started on fluid bolus with IV insulin and admitted for hyperosmolar nonketotic state. Patient admitted to stepdown unit.   Subjective:   Denies any symptoms. Tolerating diet.    Assessment  & Plan :    Principal Problem:   Type 2 diabetes mellitus with hyperosmolar nonketotic hyperglycemia (HCC) Monitored on IV insulin and given fluid bolus. Anion gap now closed. Started on Levemir 10 units twice a day. Will place on sensitive sliding scale coverage and add pre-meal aspart. Check A1c. Unclear what triggered her symptoms this time. She has had numerous hospitalization this year for uncontrolled diabetes with DKA/hyperosmolar state. I have arranged an appointment for her to see  endocrinologist Dr. Everardo All back in July when she was hospitalized but she got readmitted a few days later and did not see him. Continue gentle hydration. Diabetic coordinator following. Stable for transfer to medical floor. -She will need to see the endocrinologist within few weeks and adjust her medications.  Active Problems:    AKI (acute kidney injury) (HCC) Secondary to dehydration. Improved with I V fluids. Hold Lasix.  History of CVA with left-sided hemiparesis On statin and  Coumadin.  Mechanical aortic valve  on Coumadin. Dosing per pharmacy   Coronary artery disease with history of stenting Asymptomatic. Continue statin        Code Status : Full code  Family Communication  : None at bedside  Disposition Plan  : Return to SNF tomorrow if CBG stable   Barriers For Discharge : Active symptoms  Consults  :  None  Procedures  : None  DVT Prophylaxis  :  Coumadin  Lab Results  Component Value Date   PLT 296 10/22/2016    Antibiotics  :    Anti-infectives    None        Objective:   Vitals:   10/22/16 0700 10/22/16 0800 10/22/16 0900 10/22/16 1000  BP:  (!) 124/57  (!) 125/45  Pulse: 73 70 78 (!) 59  Resp: 17 14 18 14   Temp: 98 F (36.7 C)     TempSrc: Oral     SpO2: 98% 99% 99% 100%  Weight:      Height:        Wt Readings from Last 3 Encounters:  10/22/16 64.4 kg (141 lb 15.6 oz)  08/05/16 54.9 kg (121 lb)  07/02/16 56.2 kg (124 lb)     Intake/Output Summary (Last 24 hours) at 10/22/16 1154 Last data filed at 10/22/16 0800  Gross per 24 hour  Intake          3101.95 ml  Output             2600 ml  Net           501.95 ml     Physical Exam  Gen: Thin built female not in distress HEENT: no pallor, moist mucosa, supple neck Chest: clear b/l, no added sounds CVS: N S1&S2, no murmurs GI: soft, NT, ND,  Musculoskeletal: warm, no edema     Data Review:    CBC  Recent Labs Lab 10/21/16 1816 10/22/16 0323  WBC 4.3 4.9    HGB 9.6* 9.3*  HCT 30.6* 29.2*  PLT 277 296  MCV 74.3* 73.6*  MCH 23.3* 23.4*  MCHC 31.4 31.8  RDW 15.9* 15.4  LYMPHSABS  --  2.4  MONOABS  --  0.4  EOSABS  --  0.1  BASOSABS  --  0.0    Chemistries   Recent Labs Lab 10/21/16 1816 10/22/16 0005 10/22/16 0323 10/22/16 0700  NA 118* 134* 136 135  K 5.2* 3.9 3.9 4.5  CL 85* 101 101 102  CO2 23 24 28 25   GLUCOSE 1,019* 104* 245* 259*  BUN 26* 22* 22* 20  CREATININE 1.42* 0.90 0.80 0.78  CALCIUM 8.5* 8.8* 8.9 8.7*   ------------------------------------------------------------------------------------------------------------------ No results for input(s): CHOL, HDL, LDLCALC, TRIG, CHOLHDL, LDLDIRECT in the last 72 hours.  Lab Results  Component Value Date   HGBA1C 8.6 (H) 06/01/2016   ------------------------------------------------------------------------------------------------------------------ No results for input(s): TSH, T4TOTAL, T3FREE, THYROIDAB in the last 72 hours.  Invalid input(s): FREET3 ------------------------------------------------------------------------------------------------------------------ No results for input(s): VITAMINB12, FOLATE, FERRITIN, TIBC, IRON, RETICCTPCT in the last 72 hours.  Coagulation profile  Recent Labs Lab 10/21/16 1824 10/22/16 0323  INR 2.47 2.57    No results for input(s): DDIMER in the last 72 hours.  Cardiac Enzymes  Recent Labs Lab 10/22/16 0005  TROPONINI <0.03   ------------------------------------------------------------------------------------------------------------------    Component Value Date/Time   BNP 116.4 (H) 07/01/2016 0330    Inpatient Medications  Scheduled Meds: . citalopram  40 mg Oral Daily  . docusate  sodium  100 mg Oral BID  . insulin aspart  0-15 Units Subcutaneous TID WC  . insulin aspart  0-5 Units Subcutaneous QHS  . insulin detemir  10 Units Subcutaneous BID  . pantoprazole  40 mg Oral Daily  . pravastatin  20 mg Oral QPM   . [START ON 10/24/2016] Vitamin D (Ergocalciferol)  50,000 Units Oral Weekly  . warfarin  7.5 mg Oral ONCE-1800  . Warfarin - Pharmacist Dosing Inpatient   Does not apply q1800   Continuous Infusions: . sodium chloride    . sodium chloride 100 mL/hr at 10/22/16 0800  . sodium chloride 100 mL/hr at 10/22/16 0800  . dextrose 5 % and 0.45% NaCl Stopped (10/22/16 0201)   PRN Meds:.acetaminophen **OR** acetaminophen, dextrose, ondansetron **OR** ondansetron (ZOFRAN) IV, traMADol  Micro Results Recent Results (from the past 240 hour(s))  MRSA PCR Screening     Status: None   Collection Time: 10/21/16  9:46 PM  Result Value Ref Range Status   MRSA by PCR NEGATIVE NEGATIVE Final    Comment:        The GeneXpert MRSA Assay (FDA approved for NASAL specimens only), is one component of a comprehensive MRSA colonization surveillance program. It is not intended to diagnose MRSA infection nor to guide or monitor treatment for MRSA infections.     Radiology Reports Dg Chest 2 View  Result Date: 10/21/2016 CLINICAL DATA:  Hyperglycemia. EXAM: CHEST  2 VIEW COMPARISON:  06/01/2016. FINDINGS: Trachea is midline. Heart size normal. Lungs are clear. No pleural fluid. IMPRESSION: No acute findings. Electronically Signed   By: Leanna Battles M.D.   On: 10/21/2016 19:06    Time Spent in minutes  25   Eddie North M.D on 10/22/2016 at 11:54 AM  Between 7am to 7pm - Pager - [phone]  After 7pm go to [email or web address] - password [facility]  [facility] -  Office  [phone]

## 2016-10-23 DIAGNOSIS — Z794 Long term (current) use of insulin: Secondary | ICD-10-CM

## 2016-10-23 DIAGNOSIS — E109 Type 1 diabetes mellitus without complications: Secondary | ICD-10-CM

## 2016-10-23 DIAGNOSIS — E1100 Type 2 diabetes mellitus with hyperosmolarity without nonketotic hyperglycemic-hyperosmolar coma (NKHHC): Principal | ICD-10-CM

## 2016-10-23 DIAGNOSIS — E1165 Type 2 diabetes mellitus with hyperglycemia: Secondary | ICD-10-CM

## 2016-10-23 DIAGNOSIS — E08649 Diabetes mellitus due to underlying condition with hypoglycemia without coma: Secondary | ICD-10-CM

## 2016-10-23 LAB — GLUCOSE, CAPILLARY
GLUCOSE-CAPILLARY: 241 mg/dL — AB (ref 65–99)
GLUCOSE-CAPILLARY: 254 mg/dL — AB (ref 65–99)
GLUCOSE-CAPILLARY: 316 mg/dL — AB (ref 65–99)
GLUCOSE-CAPILLARY: 355 mg/dL — AB (ref 65–99)
GLUCOSE-CAPILLARY: 533 mg/dL — AB (ref 65–99)
Glucose-Capillary: 257 mg/dL — ABNORMAL HIGH (ref 65–99)
Glucose-Capillary: 270 mg/dL — ABNORMAL HIGH (ref 65–99)
Glucose-Capillary: 280 mg/dL — ABNORMAL HIGH (ref 65–99)
Glucose-Capillary: >600 mg/dL (ref 65–99)

## 2016-10-23 LAB — BASIC METABOLIC PANEL
Anion gap: 11 (ref 5–15)
BUN: 19 mg/dL (ref 6–20)
CALCIUM: 9 mg/dL (ref 8.9–10.3)
CO2: 22 mmol/L (ref 22–32)
CREATININE: 1.07 mg/dL — AB (ref 0.44–1.00)
Chloride: 96 mmol/L — ABNORMAL LOW (ref 101–111)
GFR, EST NON AFRICAN AMERICAN: 57 mL/min — AB (ref >60)
Glucose, Bld: 407 mg/dL — ABNORMAL HIGH (ref 65–99)
Potassium: 4.3 mmol/L (ref 3.5–5.1)
SODIUM: 129 mmol/L — AB (ref 135–145)

## 2016-10-23 LAB — HEMOGLOBIN A1C
Hgb A1c MFr Bld: 13 % — ABNORMAL HIGH (ref 4.8–5.6)
Mean Plasma Glucose: 326 mg/dL

## 2016-10-23 LAB — PROTIME-INR
INR: 3.16
PROTHROMBIN TIME: 33.2 s — AB (ref 11.4–15.2)

## 2016-10-23 MED ORDER — INSULIN DETEMIR 100 UNIT/ML ~~LOC~~ SOLN
7.0000 [IU] | Freq: Two times a day (BID) | SUBCUTANEOUS | Status: DC
  Administered 2016-10-23 – 2016-10-24 (×2): 7 [IU] via SUBCUTANEOUS
  Filled 2016-10-23 (×3): qty 0.07

## 2016-10-23 MED ORDER — INSULIN ASPART 100 UNIT/ML ~~LOC~~ SOLN
3.0000 [IU] | Freq: Three times a day (TID) | SUBCUTANEOUS | Status: DC
  Administered 2016-10-23 – 2016-10-24 (×3): 3 [IU] via SUBCUTANEOUS

## 2016-10-23 MED ORDER — INSULIN ASPART 100 UNIT/ML ~~LOC~~ SOLN
20.0000 [IU] | Freq: Once | SUBCUTANEOUS | Status: AC
  Administered 2016-10-23: 20 [IU] via SUBCUTANEOUS

## 2016-10-23 MED ORDER — WARFARIN SODIUM 2.5 MG PO TABS
2.5000 mg | ORAL_TABLET | Freq: Once | ORAL | Status: AC
  Administered 2016-10-23: 2.5 mg via ORAL
  Filled 2016-10-23: qty 1

## 2016-10-23 NOTE — Progress Notes (Signed)
CRITICAL VALUE ALERT  Critical value received:  CBG 600  Date of notification:  10/23/16  Time of notification:  1714  Critical value read back:YES  Nurse who received alert:Jordynne Mccown MD notified (1st page): DR Gonzella Lex  Time of first page: 1730  MD notified (2nd page):  Time of second page:  Responding MD:  DR Gonzella Lex  Time MD responded: [phone]

## 2016-10-23 NOTE — Progress Notes (Signed)
Nursing Note: Called by NT to report cbg of 36.Pt awake,alert and IV access was lost and IV team currently at the bedside attempting to place a new site.Pt awake and able to take pos, the patient given apple juice and ice cream as pt is on a full liquid diet.Pt cbg came up to 53,pt was given more apple juice.Then pt came up to 100.Stat lab was ordered as well.wbb

## 2016-10-23 NOTE — Progress Notes (Signed)
PROGRESS NOTE                                                                                                                                                                                                             Patient Demographics:    Madison [name], is a 55 y.o. female, DOB - 1961-10-21, BJY:[id number]  Admit date - 10/21/2016   Admitting Physician Eduard Clos, MD  Outpatient Primary MD for the patient is [name], NP  LOS - 1  Outpatient Specialists:  Chief Complaint  Patient presents with  . Hyperglycemia       Brief Narrative   55 year old female with type 2 diabetes mellitus on insulin, recurrent hospitalization for hyperosmolar nonketotic state/DKA (this is her 17th ADMISSION this year for the same), CAD status post stenting, mechanical aortic valve on Coumadin, history of CVA with left-sided hemiparesis, severe malnutrition with currently resident of SNF was brought to the ED with persistently elevated blood sugar for past 3 days. Patient informs that she has not missed her insulin and her diet has not changed. She does report mild URI symptoms. In the ED her blood glucose was in 1000 with anion gap of 10. She was started on fluid bolus with IV insulin and admitted for hyperosmolar nonketotic state. Patient admitted to stepdown unit.   Subjective:   Patient hypoglycemic last night to 36. Asymptomatic. He Levemir and started D5NS. CBG elevated up to 350 this morning.   Assessment  & Plan :    Principal Problem:   Type 2 diabetes mellitus with hyperosmolar nonketotic hyperglycemia (HCC) -DKA resolved. Now CBG fluctuating with episode of hypo-and hyperglycemia. -Levemir was held for hypoglycemia last night, resumed at lower dose today and lowered pre-meal aspart. -A1c of 13 (worsened from the past few months)  Unclear what triggered her symptoms this time. She has had numerous hospitalization  this year for uncontrolled diabetes with DKA/hyperosmolar state. I have arranged an appointment for her to see endocrinologist Dr. Everardo All back in July when she was hospitalized but she got readmitted a few days later and did not see him. Continue gentle hydration. Diabetic coordinator following. Stable for transfer to medical floor. -She will need to see the endocrinologist within few weeks and adjust her medications.  Active Problems:    AKI (acute kidney injury) (HCC) Secondary to dehydration. Improved  with I V fluids. Holding Lasix  Hypoglycemia Needs bedtime snack to prevent nocturnal hypoglycemia.  History of CVA with left-sided hemiparesis On statin and Coumadin.  Mechanical aortic valve  on Coumadin. Dosing per pharmacy   Coronary artery disease with history of stenting Asymptomatic. Continue statin        Code Status : Full code  Family Communication  : None at bedside  Disposition Plan  : Monitor for another day given hypo-and hyperglycemia. Return to SNF tomorrow if CBG stable   Barriers For Discharge : Active symptoms  Consults  :  None  Procedures  : None  DVT Prophylaxis  :  Coumadin  Lab Results  Component Value Date   PLT 296 10/22/2016    Antibiotics  :    Anti-infectives    None        Objective:   Vitals:   10/22/16 1441 10/22/16 2057 10/23/16 0413 10/23/16 1320  BP: 121/67 (!) 120/99 (!) 141/57 (!) 120/59  Pulse: 70 67 61 75  Resp: 16 18 18 16   Temp: 97.9 F (36.6 C) 97.8 F (36.6 C) 97.9 F (36.6 C) 98.2 F (36.8 C)  TempSrc: Oral Oral Oral Oral  SpO2: 100% 100% 99% 100%  Weight:   63.4 kg (139 lb 12.4 oz)   Height:        Wt Readings from Last 3 Encounters:  10/23/16 63.4 kg (139 lb 12.4 oz)  08/05/16 54.9 kg (121 lb)  07/02/16 56.2 kg (124 lb)     Intake/Output Summary (Last 24 hours) at 10/23/16 1350 Last data filed at 10/23/16 0952  Gross per 24 hour  Intake             1270 ml  Output             1800 ml    Net             -530 ml     Physical Exam  Gen: Thin built female not in distress HEENT:  moist mucosa, supple neck Chest: clear b/l, no added sounds CVS: N S1&S2, no murmurs GI: soft, NT, ND,  Musculoskeletal: warm, no edema     Data Review:    CBC  Recent Labs Lab 10/21/16 1816 10/22/16 0323  WBC 4.3 4.9  HGB 9.6* 9.3*  HCT 30.6* 29.2*  PLT 277 296  MCV 74.3* 73.6*  MCH 23.3* 23.4*  MCHC 31.4 31.8  RDW 15.9* 15.4  LYMPHSABS  --  2.4  MONOABS  --  0.4  EOSABS  --  0.1  BASOSABS  --  0.0    Chemistries   Recent Labs Lab 10/21/16 1816 10/22/16 0005 10/22/16 0323 10/22/16 0700 10/22/16 2231  NA 118* 134* 136 135  --   K 5.2* 3.9 3.9 4.5  --   CL 85* 101 101 102  --   CO2 23 24 28 25   --   GLUCOSE 1,019* 104* 245* 259* 117*  BUN 26* 22* 22* 20  --   CREATININE 1.42* 0.90 0.80 0.78  --   CALCIUM 8.5* 8.8* 8.9 8.7*  --    ------------------------------------------------------------------------------------------------------------------ No results for input(s): CHOL, HDL, LDLCALC, TRIG, CHOLHDL, LDLDIRECT in the last 72 hours.  Lab Results  Component Value Date   HGBA1C 13.0 (H) 10/22/2016   ------------------------------------------------------------------------------------------------------------------ No results for input(s): TSH, T4TOTAL, T3FREE, THYROIDAB in the last 72 hours.  Invalid input(s): FREET3 ------------------------------------------------------------------------------------------------------------------ No results for input(s): VITAMINB12, FOLATE, FERRITIN, TIBC, IRON, RETICCTPCT in the last 72 hours.  Coagulation profile  Recent Labs Lab 10/21/16 1824 10/22/16 0323 10/23/16 0355  INR 2.47 2.57 3.16    No results for input(s): DDIMER in the last 72 hours.  Cardiac Enzymes  Recent Labs Lab 10/22/16 0005  TROPONINI <0.03    ------------------------------------------------------------------------------------------------------------------    Component Value Date/Time   BNP 116.4 (H) 07/01/2016 0330    Inpatient Medications  Scheduled Meds: . citalopram  40 mg Oral Daily  . docusate sodium  100 mg Oral BID  . insulin aspart  3 Units Subcutaneous TID WC  . insulin detemir  7 Units Subcutaneous BID  . pantoprazole  40 mg Oral Daily  . pravastatin  20 mg Oral QPM  . [START ON 10/24/2016] Vitamin D (Ergocalciferol)  50,000 Units Oral Weekly  . warfarin  2.5 mg Oral ONCE-1800  . Warfarin - Pharmacist Dosing Inpatient   Does not apply q1800   Continuous Infusions: . dextrose 5 % and 0.9% NaCl 20 mL/hr at 10/23/16 0800   PRN Meds:.acetaminophen **OR** acetaminophen, dextrose, ondansetron **OR** ondansetron (ZOFRAN) IV, traMADol  Micro Results Recent Results (from the past 240 hour(s))  MRSA PCR Screening     Status: None   Collection Time: 10/21/16  9:46 PM  Result Value Ref Range Status   MRSA by PCR NEGATIVE NEGATIVE Final    Comment:        The GeneXpert MRSA Assay (FDA approved for NASAL specimens only), is one component of a comprehensive MRSA colonization surveillance program. It is not intended to diagnose MRSA infection nor to guide or monitor treatment for MRSA infections.     Radiology Reports Dg Chest 2 View  Result Date: 10/21/2016 CLINICAL DATA:  Hyperglycemia. EXAM: CHEST  2 VIEW COMPARISON:  06/01/2016. FINDINGS: Trachea is midline. Heart size normal. Lungs are clear. No pleural fluid. IMPRESSION: No acute findings. Electronically Signed   By: Leanna Battles M.D.   On: 10/21/2016 19:06    Time Spent in minutes  25   Eddie North M.D on 10/23/2016 at 1:50 PM  Between 7am to 7pm - Pager - [phone]  After 7pm go to [email or web address] - password [facility]  [facility] -  Office  [phone]

## 2016-10-23 NOTE — Significant Event (Signed)
Hypoglycemic Event  CBG: 53  Treatment: 15 GM carbohydrate snack  Symptoms: Shaky  Follow-up CBG: Time:2232 CBG Result:100  Possible Reasons for Event: medication regimen  Comments/MD notified:Kirby-Graham,NP    Laquanna Veazey, Jamesetta Orleans

## 2016-10-23 NOTE — Progress Notes (Signed)
ANTICOAGULATION CONSULT NOTE - Initial Consult  Pharmacy Consult for Warfarin Indication: mechanical heart valve  Allergies  Allergen Reactions  . Adhesive [Tape] Other (See Comments)    Reaction:  Burning   . Celebrex [Celecoxib] Rash  . Detrol [Tolterodine] Hives    Patient Measurements: Height: 5\' 3"  (160 cm) Weight: 139 lb 12.4 oz (63.4 kg) IBW/kg (Calculated) : 52.4   Vital Signs: Temp: 97.9 F (36.6 C) (12/05 0413) Temp Source: Oral (12/05 0413) BP: 141/57 (12/05 0413) Pulse Rate: 61 (12/05 0413)  Labs:  Recent Labs  10/21/16 1816 10/21/16 1824 10/22/16 0005 10/22/16 0323 10/22/16 0700 10/23/16 0355  HGB 9.6*  --   --  9.3*  --   --   HCT 30.6*  --   --  29.2*  --   --   PLT 277  --   --  296  --   --   LABPROT  --  27.2*  --  28.1*  --  33.2*  INR  --  2.47  --  2.57  --  3.16  CREATININE 1.42*  --  0.90 0.80 0.78  --   TROPONINI  --   --  <0.03  --   --   --     Estimated Creatinine Clearance: 71.2 mL/min (by C-G formula based on SCr of 0.78 mg/dL).   Medical History: Past Medical History:  Diagnosis Date  . Allergy   . Anxiety   . Aortic stenosis   . CAD (coronary artery disease)   . CHF (congestive heart failure) (HCC)   . Depression   . Diabetes (HCC) dx'd 1982   "went straight to insulin"  . Dyslipidemia   . GERD (gastroesophageal reflux disease)   . Heart murmur   . HTN (hypertension)   . Hypercholesteremia   . Myocardial infarction 12/2014  . Obesity   . Pneumonia 11/2014; 12/2014  . Rheumatoid arthritis(714.0)    "hands" (07/01/2015)  . Stroke syndrome [facility]) 1995; 2001   "when my son was born; problems w/speech and L hand since then" (01/19/2015)  . Thrombophlebitis     Assessment:  55 yr female admitted with hyperglycemia.   PMH significant for mechanical aortic valve replacement  PTA on warfarin 7.5mg  daily with last dose reported as taken on 12/2 @ 17:00  Today, 10/23/2016  INR this AM therapeutic in mid range, increased  significantly overnight  CBC yesterday shows low hgb, stable overnight, plts WNL, no bleeding issues noted  FLD  No DDI noted  Note higher goal INR  Goal of Therapy:  INR 2.5-3.5   Plan:   Decrease Warfarin 2.5mg  po x 1 dose tonight  Check daily PT/INR  14/03/2016, PharmD, BCPS Pager: [phone] 10/23/2016,8:02 AM

## 2016-10-23 NOTE — Significant Event (Signed)
Hypoglycemic Event  CBG: 36  Treatment:snack  Symptoms: Shaky  Follow-up CBG: Time:2216 CBG Result:53  Possible Reasons for Event: medication regimen  Comments/MD notified:Kirby-Graham,NP    Madison [name], Madison [name]

## 2016-10-23 NOTE — [place] FL2 (Signed)
[place] MEDICAID FL2 LEVEL OF CARE SCREENING TOOL     IDENTIFICATION  Patient Name: Madison [name] Birthdate: Dec 13, 1960 Sex: female Admission Date (Current Location): 10/21/2016  [facility] and [place] Number:  Producer, television/film/video and Address:  [facility],  [address], [place] [address]      Provider Number: [id number]  Attending Physician Name and Address:  Eddie North, MD  Relative Name and Phone Number:       Current Level of Care: Hospital Recommended Level of Care: Skilled Nursing Facility Prior Approval Number:    Date Approved/Denied:   PASRR Number:    Discharge Plan: SNF    Current Diagnoses: Patient Active Problem List   Diagnosis Date Noted  . Type 2 diabetes mellitus with hyperosmolar nonketotic hyperglycemia (HCC) 10/21/2016  . Left ear pain 07/01/2016  . Hyperglycemia 06/06/2016  . Malnutrition of moderate degree 06/04/2016  . Septic shock (HCC) 06/04/2016  . SIRS (systemic inflammatory response syndrome) (HCC) 05/25/2016  . Type 1 diabetes mellitus with ketoacidosis without coma (HCC)   . MDD (major depressive disorder), recurrent episode, moderate (HCC)   . Aspiration pneumonia (HCC) 05/17/2016  . Diabetic ketoacidosis without coma (HCC)   . Sepsis (HCC)   . 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 MSOF (multiple systems organ failure) 05/01/2016  . Acute hypoxemic respiratory failure (HCC)   . Type I diabetes mellitus with complication, uncontrolled (HCC) 03/29/2016  . Adjustment disorder with mixed anxiety and depressed mood 03/28/2016  . HLD (hyperlipidemia) 03/27/2016  . AKI (acute kidney injury) (HCC) 03/27/2016  . Chronic combined systolic and diastolic congestive heart failure (HCC)   . Diabetic ketoacidosis (HCC) 03/26/2016  . Type 1 diabetes, HbA1c goal < 7% (HCC)   . Hypotension 03/08/2016  . Uncontrolled type 1 diabetes mellitus with ketoacidotic coma (HCC)   . Acute encephalopathy 02/28/2016  .  Hyponatremia: Pseudo 02/28/2016  . Lactic acidosis   . Diabetic ketoacidosis with coma associated with type 1 diabetes mellitus (HCC)   . Hypoglycemia   . Chronic diastolic heart failure (HCC)   . Esophageal reflux   . S/P AVR (aortic valve replacement)   . ARF (acute renal failure) (HCC) 01/22/2016  . CHF (congestive heart failure) (HCC) 01/22/2016  . Diabetic ketoacidosis without coma associated with type 1 diabetes mellitus (HCC)   . DKA, type 1 (HCC) 11/23/2015  . Constipation 11/23/2015  . Leukocytosis   . Pressure ulcer 08/02/2015  . Diabetic ketoacidosis with coma associated with other specified diabetes mellitus (HCC)   . Closed left ankle fracture 07/01/2015  . Trimalleolar fracture of left ankle 07/01/2015  . Hyperkalemia   . Insomnia 06/16/2015  . Normocytic anemia 01/31/2015  . Acute on chronic combined systolic and diastolic CHF (congestive heart failure) (HCC)   . Essential hypertension   . Elevated troponin   . Diabetes type 1, uncontrolled (HCC)   . Demand ischemia (HCC)   . S/P aortic valve replacement   . Hypokalemia   . NSTEMI (non-ST elevated myocardial infarction) (HCC) 12/08/2014  . Blood poisoning (HCC)   . Supratherapeutic INR   . Severe sepsis with acute organ dysfunction (HCC) 12/03/2014  . Spastic hemiplegia affecting nondominant side (HCC) 11/29/2014  . Dysphagia, pharyngoesophageal phase 10/04/2014  . Sinus tachycardia (HCC) 09/30/2014  . Depression 04/29/2014  . CVA (cerebral infarction) 03/12/2014  . Acute respiratory failure (HCC) 03/12/2014  . Chronic anticoagulation 07/28/2013  . Long term (current) use of anticoagulants 07/28/2013  . CAD (coronary artery disease)   .  Aortic stenosis   . Hyperlipidemia   . Rheumatoid arthritis (HCC)     Orientation RESPIRATION BLADDER Height & Weight     Self, Time, Situation, Place  Normal Continent Weight: 139 lb 12.4 oz (63.4 kg) Height:  5\' 3"  (160 cm)  BEHAVIORAL SYMPTOMS/MOOD NEUROLOGICAL BOWEL  NUTRITION STATUS  Other (Comment) (no behaviors)   Continent Diet  AMBULATORY STATUS COMMUNICATION OF NEEDS Skin   Limited Assist Verbally Normal                       Personal Care Assistance Level of Assistance  Bathing, Feeding, Dressing Bathing Assistance: Limited assistance Feeding assistance: Independent Dressing Assistance: Limited assistance     Functional Limitations Info  Sight, Hearing, Speech Sight Info: Adequate Hearing Info: Adequate Speech Info: Adequate    SPECIAL CARE FACTORS FREQUENCY                       Contractures Contractures Info: Not present    Additional Factors Info  Code Status Code Status Info: Full Code             Current Medications (10/23/2016):  This is the current hospital active medication list Current Facility-Administered Medications  Medication Dose Route Frequency Provider Last Rate Last Dose  . acetaminophen (TYLENOL) tablet 650 mg  650 mg Oral Q6H PRN 14/03/2016, MD       Or  . acetaminophen (TYLENOL) suppository 650 mg  650 mg Rectal Q6H PRN Eduard Clos, MD      . citalopram (CELEXA) tablet 40 mg  40 mg Oral Daily Eduard Clos, MD   40 mg at 10/23/16 1013  . dextrose 5 %-0.9 % sodium chloride infusion   Intravenous Continuous 14/05/17, NP 20 mL/hr at 10/23/16 0800    . dextrose 50 % solution 25 mL  25 mL Intravenous PRN 14/05/17, MD      . docusate sodium (COLACE) capsule 100 mg  100 mg Oral BID Eduard Clos, MD   100 mg at 10/23/16 1013  . insulin aspart (novoLOG) injection 4 Units  4 Units Subcutaneous TID WC Nishant Dhungel, MD   4 Units at 10/23/16 0809  . ondansetron (ZOFRAN) tablet 4 mg  4 mg Oral Q6H PRN 14/05/17, MD       Or  . ondansetron [facility]) injection 4 mg  4 mg Intravenous Q6H PRN [facility], MD      . pantoprazole (PROTONIX) EC tablet 40 mg  40 mg Oral Daily Eduard Clos, MD   40 mg at 10/23/16 1012  . pravastatin  (PRAVACHOL) tablet 20 mg  20 mg Oral QPM 14/05/17, MD   20 mg at 10/22/16 1755  . traMADol (ULTRAM) tablet 50 mg  50 mg Oral Q6H PRN 14/04/17, MD      . Eduard Clos ON 10/24/2016] Vitamin D (Ergocalciferol) (DRISDOL) capsule 50,000 Units  50,000 Units Oral Weekly 14/04/2016, MD      . warfarin (COUMADIN) tablet 2.5 mg  2.5 mg Oral ONCE-1800 Eduard Clos, [facility]      . Warfarin - Pharmacist Dosing Inpatient   Does not apply q1800 [facility], [facility]         Discharge Medications: Please see discharge summary for a list of discharge medications.  Relevant Imaging Results:  Relevant Lab Results:   Additional Information SS#[id number]  Maejor Erven, 04-22-1982, LCSW

## 2016-10-23 NOTE — Progress Notes (Signed)
Inpatient Diabetes Program Recommendations  AACE/ADA: New Consensus Statement on Inpatient Glycemic Control (2015)  Target Ranges:  Prepandial:   less than 140 mg/dL      Peak postprandial:   less than 180 mg/dL (1-2 hours)      Critically ill patients:  140 - 180 mg/dL   Results for GINNI, EICHLER (MRN [id number]) as of 10/23/2016 09:51  Ref. Range 10/22/2016 08:43 10/22/2016 11:51 10/22/2016 18:08 10/22/2016 19:32 10/22/2016 22:01 10/22/2016 22:16 10/22/2016 22:32  Glucose-Capillary Latest Ref Range: 65 - 99 mg/dL 401 (H) 027 (H) 253 (H) 209 (H) 36 (LL) 53 (L) 100 (H)   Results for ISOLDE, SKAFF (MRN [id number]) as of 10/23/2016 09:51  Ref. Range 10/23/2016 00:12 10/23/2016 02:17 10/23/2016 04:10 10/23/2016 05:54 10/23/2016 07:34  Glucose-Capillary Latest Ref Range: 65 - 99 mg/dL 259 (H) 563 (H) 875 (H) 316 (H) 355 (H)    SNF DM Meds: Levemir 10 units BID                           Novolog 2-10 units TID per SSI  Current Insulin Orders: Novolog 4 units TID with meals       -Patient well known to the Inpatient Diabetes Program.  This is patient's 16th admission since January of 2017.  -Note patient transitioned off IV Insulin drip yesterday.  Was given 10 units Levemir at 2am yesterday and Insulin drip was stopped at 1:30am.  Patient received next dose of scheduled Levemir (10 units) at 9am yesterday morning.  -Patient then received 4 units Novolog meal coverage at 6pm last evening and then had Hypoglycemic event at 10pm (CBG down to 36 mg/dl).  -Note on call provider discontinued Levemir and Novolog Sensitive SSI last PM.  Currently patient only has orders for Novolog 4 units TID with meals.      MD- Please consider the following in-hospital insulin adjustments:  1. Restart Levemir at lower dose- Recommend Levemir 7 units BID (please start this AM as CBG today already up to 355 mg/dl)  2. Restart Novolog Sensitive Correction Scale/ SSI (0-9 units) TID AC + HS  3. Reduce  Novolog Meal Coverage to: Novolog 3 units TID with meals (hold if pt eats <50% of meal)      --Will follow patient during hospitalization--  Ambrose Finland RN, MSN, CDE Diabetes Coordinator Inpatient Glycemic Control Team Team Pager: [phone] (8a-5p)

## 2016-10-24 DIAGNOSIS — I2510 Atherosclerotic heart disease of native coronary artery without angina pectoris: Secondary | ICD-10-CM

## 2016-10-24 DIAGNOSIS — Z952 Presence of prosthetic heart valve: Secondary | ICD-10-CM

## 2016-10-24 DIAGNOSIS — I2583 Coronary atherosclerosis due to lipid rich plaque: Secondary | ICD-10-CM

## 2016-10-24 DIAGNOSIS — I10 Essential (primary) hypertension: Secondary | ICD-10-CM

## 2016-10-24 LAB — COMPREHENSIVE METABOLIC PANEL
ALT: 14 U/L (ref 14–54)
AST: 19 U/L (ref 15–41)
Albumin: 3.3 g/dL — ABNORMAL LOW (ref 3.5–5.0)
Alkaline Phosphatase: 92 U/L (ref 38–126)
Anion gap: 5 (ref 5–15)
BUN: 15 mg/dL (ref 6–20)
CHLORIDE: 104 mmol/L (ref 101–111)
CO2: 29 mmol/L (ref 22–32)
CREATININE: 0.67 mg/dL (ref 0.44–1.00)
Calcium: 9 mg/dL (ref 8.9–10.3)
Glucose, Bld: 171 mg/dL — ABNORMAL HIGH (ref 65–99)
POTASSIUM: 4.5 mmol/L (ref 3.5–5.1)
Sodium: 138 mmol/L (ref 135–145)
Total Bilirubin: 0.5 mg/dL (ref 0.3–1.2)
Total Protein: 6.6 g/dL (ref 6.5–8.1)

## 2016-10-24 LAB — GLUCOSE, CAPILLARY
Glucose-Capillary: 172 mg/dL — ABNORMAL HIGH (ref 65–99)
Glucose-Capillary: 249 mg/dL — ABNORMAL HIGH (ref 65–99)

## 2016-10-24 LAB — CBC WITH DIFFERENTIAL/PLATELET
Basophils Absolute: 0.1 10*3/uL (ref 0.0–0.1)
Basophils Relative: 1 %
EOS PCT: 2 %
Eosinophils Absolute: 0.1 10*3/uL (ref 0.0–0.7)
HCT: 30.6 % — ABNORMAL LOW (ref 36.0–46.0)
Hemoglobin: 10 g/dL — ABNORMAL LOW (ref 12.0–15.0)
LYMPHS ABS: 2.6 10*3/uL (ref 0.7–4.0)
LYMPHS PCT: 56 %
MCH: 23.6 pg — AB (ref 26.0–34.0)
MCHC: 32.7 g/dL (ref 30.0–36.0)
MCV: 72.2 fL — AB (ref 78.0–100.0)
MONO ABS: 0.3 10*3/uL (ref 0.1–1.0)
Monocytes Relative: 6 %
Neutro Abs: 1.6 10*3/uL — ABNORMAL LOW (ref 1.7–7.7)
Neutrophils Relative %: 35 %
PLATELETS: 273 10*3/uL (ref 150–400)
RBC: 4.24 MIL/uL (ref 3.87–5.11)
RDW: 15.5 % (ref 11.5–15.5)
WBC: 4.6 10*3/uL (ref 4.0–10.5)

## 2016-10-24 LAB — PHOSPHORUS: PHOSPHORUS: 4.3 mg/dL (ref 2.5–4.6)

## 2016-10-24 LAB — MAGNESIUM: MAGNESIUM: 1.7 mg/dL (ref 1.7–2.4)

## 2016-10-24 LAB — PROTIME-INR
INR: 3.34
PROTHROMBIN TIME: 34.6 s — AB (ref 11.4–15.2)

## 2016-10-24 MED ORDER — WARFARIN SODIUM 5 MG PO TABS: 5.0000 mg | ORAL_TABLET | Freq: Once | ORAL | Status: DC

## 2016-10-24 MED ORDER — INSULIN ASPART 100 UNIT/ML ~~LOC~~ SOLN: 3.0000 [IU] | Freq: Three times a day (TID) | SUBCUTANEOUS | 11 refills | Status: DC

## 2016-10-24 MED ORDER — TRAMADOL HCL 50 MG PO TABS: 50.0000 mg | ORAL_TABLET | Freq: Four times a day (QID) | ORAL | 0 refills | Status: DC | PRN

## 2016-10-24 MED ORDER — INSULIN DETEMIR 100 UNIT/ML ~~LOC~~ SOLN: 7.0000 [IU] | Freq: Two times a day (BID) | SUBCUTANEOUS | 11 refills | Status: DC

## 2016-10-24 MED ORDER — ZOLPIDEM TARTRATE 5 MG PO TABS
5.0000 mg | ORAL_TABLET | Freq: Every evening | ORAL | Status: DC | PRN
  Administered 2016-10-24: 5 mg via ORAL
  Filled 2016-10-24: qty 1

## 2016-10-24 NOTE — Progress Notes (Signed)
ANTICOAGULATION CONSULT NOTE - Initial Consult  Pharmacy Consult for Warfarin Indication: mechanical heart valve  Allergies  Allergen Reactions  . Adhesive [Tape] Other (See Comments)    Reaction:  Burning   . Celebrex [Celecoxib] Rash  . Detrol [Tolterodine] Hives    Patient Measurements: Height: 5\' 3"  (160 cm) Weight: 138 lb 14.2 oz (63 kg) IBW/kg (Calculated) : 52.4   Vital Signs: Temp: 97.9 F (36.6 C) (12/06 0529) Temp Source: Oral (12/06 0529) BP: 117/56 (12/06 0529) Pulse Rate: 67 (12/06 0529)  Labs:  Recent Labs  10/21/16 1816  10/22/16 0005 10/22/16 0323 10/22/16 0700 10/23/16 0355 10/23/16 1955 10/24/16 0401 10/24/16 0816  HGB 9.6*  --   --  9.3*  --   --   --   --  10.0*  HCT 30.6*  --   --  29.2*  --   --   --   --  30.6*  PLT 277  --   --  296  --   --   --   --  273  LABPROT  --   < >  --  28.1*  --  33.2*  --  34.6*  --   INR  --   < >  --  2.57  --  3.16  --  3.34  --   CREATININE 1.42*  --  0.90 0.80 0.78  --  1.07*  --  0.67  TROPONINI  --   --  <0.03  --   --   --   --   --   --   < > = values in this interval not displayed.  Estimated Creatinine Clearance: 71 mL/min (by C-G formula based on SCr of 0.67 mg/dL).   Medical History: Past Medical History:  Diagnosis Date  . Allergy   . Anxiety   . Aortic stenosis   . CAD (coronary artery disease)   . CHF (congestive heart failure) (HCC)   . Depression   . Diabetes (HCC) dx'd 1982   "went straight to insulin"  . Dyslipidemia   . GERD (gastroesophageal reflux disease)   . Heart murmur   . HTN (hypertension)   . Hypercholesteremia   . Myocardial infarction 12/2014  . Obesity   . Pneumonia 11/2014; 12/2014  . Rheumatoid arthritis(714.0)    "hands" (07/01/2015)  . Stroke syndrome [facility]) 1995; 2001   "when my son was born; problems w/speech and L hand since then" (01/19/2015)  . Thrombophlebitis     Assessment:  55 yr female admitted with hyperglycemia.   PMH significant for mechanical  aortic valve replacement  PTA on warfarin 7.5mg  daily with last dose reported as taken on 12/2 @ 17:00  Today, 10/24/2016  INR this AM therapeutic   CBC today shows low hgb but improved,  plts WNL, no bleeding issues noted  Full diet resumed  No DDI noted  Note higher goal INR  Goal of Therapy:  INR 2.5-3.5   Plan:    Warfarin 5mg  po x 1 dose tonight  Check daily PT/INR  14/04/2016 RPh 10/24/2016, 9:32 AM Pager [phone]

## 2016-10-24 NOTE — Discharge Summary (Signed)
Physician Discharge Summary  Madison [name] MWN:[id number] DOB: Aug 14, 1961 DOA: 10/21/2016  PCP: [name], NP  Admit date: 10/21/2016 Discharge date: 10/24/2016  Admitted From: Nursing Home Disposition:  [facility]  Recommendations for Outpatient Follow-up:  1. Follow up with PCP in 1-2 weeks 2. Follow up with Endocrinology Dr. Everardo All 3. Please obtain BMP/CBC within 72 hours 4. Repeat PT/INR in AM for Coumadin Dosing  Home Health: No Equipment/Devices: None  Discharge Condition: Stable and Improved CODE STATUS: FULL Diet recommendation: Heart Healthy / Carb Modified   Brief/Interim Summary: [name] is a 55 year old female with Uncontrolled Type 2 Diabetes Mellitus on insulin, recurrent hospitalization for hyperosmolar nonketotic state/DKA (this is her 17th ADMISSION this year for the same), CAD s/p stenting, Mechanical aortic valve on Coumadin, history of CVA with left-sided hemiparesis, severe malnutrition with currently resident of SNF was brought to the ED with persistently elevated blood sugar for past 3 days. Patient stated that she has not missed her insulin and her diet has not changed. She does report mild URI symptoms. In the ED her blood glucose was in 1000 with anion gap of 10. She was started on fluid bolus with IV insulin and admitted for hyperosmolar nonketotic state and admitted to the stepdown unit. She had fluctuating episodes of hypo- and hyperglcyemia with improvement. Premeal and Basal insulin were lowered and can be adjusted as an outpatient. Patient's symptoms improved and she will need to follow up with here PCP and with Endocrinology Dr. Everardo All within 1-2 weeks for Medication adjustment. At this time patient feels well and is stable to be D/C'd back to SNF.  Discharge Diagnoses:  Principal Problem:   Type 2 diabetes mellitus with hyperosmolar nonketotic hyperglycemia (HCC) Active Problems:   CAD (coronary artery disease)   Essential  hypertension   S/P AVR (aortic valve replacement)   AKI (acute kidney injury) (HCC)   Hyperglycemia  Uncontrolled Type 2 diabetes mellitus with Hyperosmolar Nonketotic Hyperglycemia (HCC) -HHS resolved. Now CBG fluctuating with episode of hypo-and hyperglycemia. -Levemir was held for hypoglycemia. Resumed at lower dose yesterday and lowered pre-meal aspart. C/w 7 Units of Levemir BID and 3 Units sq with Meals -Hemoglobin A1c of 13 (worsened from the past few months) -Unclear what triggered her symptoms this time. She has had numerous hospitalization this year for uncontrolled diabetes with DKA/hyperosmolar state. Dr. Cain Sieve Dhungel had arranged an appointment for her to see endocrinologist Dr. Everardo All back in July when she was hospitalized but she got readmitted a few days later and did not see him. -She will need to see the Endocrinologist Dr. Davy Pique few weeks and adjust her medications. -Continue to Monitor CBG's Closely -PCP follow up within 1-2 weeks  AKI (acute kidney injury)  -Secondary to dehydration.  -Improved with IV fluids.  -Holding Lasix at D/C and can be re-evaluated as an outpatient to start.  Hypoglycemia, resolved -Needs bedtime snack to prevent nocturnal hypoglycemia.  -Close Follow up with Dr. Everardo All in Endocrinology as an outpatient as well as Primary Care Provider Angela Adam, NP  History of CVA with left-sided hemiparesis -Continues to have residual Deficits on Left Side -C/w Pravastatin and with Coumadin dose with Repeat PT/INR in AM for adjustments  Mechanical aortic valve -C/w Home Coumadin and Maintain INR between 2.5-3.5  Coronary artery disease with history of stenting -Asymptomatic. Continue Pravastatin  Depression -C/w Citalopram 40 mg po Daily  Discharge Instructions  Discharge Instructions    Call MD for:  difficulty breathing, headache or visual disturbances  Complete by:  As directed    Call MD for:  persistant dizziness or  light-headedness    Complete by:  As directed    Call MD for:  persistant nausea and vomiting    Complete by:  As directed    Diet - low sodium heart healthy    Complete by:  As directed    CARB MODIFIED   Discharge instructions    Complete by:  As directed    Follow up Care at SNF and follow up with Dr. Everardo All as an outpatient. Repeat Labwork in AM   Increase activity slowly    Complete by:  As directed        Medication List    STOP taking these medications   cephALEXin 500 MG capsule Commonly known as:  KEFLEX   furosemide 20 MG tablet Commonly known as:  LASIX   Melatonin 3 MG Tabs     TAKE these medications   citalopram 40 MG tablet Commonly known as:  CELEXA Take 1 tablet (40 mg total) by mouth daily.   docusate sodium 100 MG capsule Commonly known as:  COLACE Take 100 mg by mouth 2 (two) times daily.   ergocalciferol 50000 units capsule Commonly known as:  VITAMIN D2 Take 50,000 Units by mouth once a week. Pt takes on Wednesday.   insulin aspart 100 UNIT/ML injection Commonly known as:  novoLOG Inject 3 Units into the skin 3 (three) times daily with meals. What changed:  how much to take  when to take this  additional instructions   insulin detemir 100 UNIT/ML injection Commonly known as:  LEVEMIR Inject 0.07 mLs (7 Units total) into the skin 2 (two) times daily. What changed:  how much to take   NUTRITIONAL DRINK PO Take 120 mLs by mouth 3 (three) times daily.   omeprazole 40 MG capsule Commonly known as:  PRILOSEC Take 40 mg by mouth daily before breakfast.   pravastatin 20 MG tablet Commonly known as:  PRAVACHOL Take 20 mg by mouth every evening.   traMADol 50 MG tablet Commonly known as:  ULTRAM Take 1 tablet (50 mg total) by mouth every 6 (six) hours as needed for moderate pain or severe pain.   warfarin 5 MG tablet Commonly known as:  COUMADIN Take 7.5 mg by mouth daily. Stop 10/21/16 per [facility]       Allergies  Allergen  Reactions  . Adhesive [Tape] Other (See Comments)    Reaction:  Burning   . Celebrex [Celecoxib] Rash  . Detrol [Tolterodine] Hives   Consultations:  None   Procedures/Studies: Dg Chest 2 View  Result Date: 10/21/2016 CLINICAL DATA:  Hyperglycemia. EXAM: CHEST  2 VIEW COMPARISON:  06/01/2016. FINDINGS: Trachea is midline. Heart size normal. Lungs are clear. No pleural fluid. IMPRESSION: No acute findings. Electronically Signed   By: Leanna Battles M.D.   On: 10/21/2016 19:06     Subjective: Seen and examined at bedside and doing well. Had no CP SOB. No Hypoglycemic Episodes overnight. No other complaints or concerns and ready to go back to SNF.   Discharge Exam: Vitals:   10/23/16 2108 10/24/16 0529  BP: (!) 105/55 (!) 117/56  Pulse: 73 67  Resp: 16 16  Temp: 98.2 F (36.8 C) 97.9 F (36.6 C)   Vitals:   10/23/16 1320 10/23/16 2108 10/24/16 0529 10/24/16 0531  BP: (!) 120/59 (!) 105/55 (!) 117/56   Pulse: 75 73 67   Resp: 16 16 16    Temp:  98.2 F (36.8 C) 98.2 F (36.8 C) 97.9 F (36.6 C)   TempSrc: Oral Oral Oral   SpO2: 100% 100% 98%   Weight:    63 kg (138 lb 14.2 oz)  Height:       General: Pt is alert, awake, not in acute distress Cardiovascular: RRR, S1/S2 +, no rubs, no gallops Respiratory: CTA bilaterally, no wheezing, no rhonchi Abdominal: Soft, NT, ND, bowel sounds + Extremities: no edema, no cyanosis Neurological: Residual Left Sided deficits and contractures appreaciated  The results of significant diagnostics from this hospitalization (including imaging, microbiology, ancillary and laboratory) are listed below for reference.    Microbiology: Recent Results (from the past 240 hour(s))  MRSA PCR Screening     Status: None   Collection Time: 10/21/16  9:46 PM  Result Value Ref Range Status   MRSA by PCR NEGATIVE NEGATIVE Final    Comment:        The GeneXpert MRSA Assay (FDA approved for NASAL specimens only), is one component of  a comprehensive MRSA colonization surveillance program. It is not intended to diagnose MRSA infection nor to guide or monitor treatment for MRSA infections.     Labs: BNP (last 3 results)  Recent Labs  03/27/16 0832 05/25/16 1805 07/01/16 0330  BNP 73.7 86.2 116.4*   Basic Metabolic Panel:  Recent Labs Lab 10/22/16 0005 10/22/16 0323 10/22/16 0700 10/22/16 2231 10/23/16 1955 10/24/16 0816  NA 134* 136 135  --  129* 138  K 3.9 3.9 4.5  --  4.3 4.5  CL 101 101 102  --  96* 104  CO2 24 28 25   --  22 29  GLUCOSE 104* 245* 259* 117* 407* 171*  BUN 22* 22* 20  --  19 15  CREATININE 0.90 0.80 0.78  --  1.07* 0.67  CALCIUM 8.8* 8.9 8.7*  --  9.0 9.0  MG  --   --   --   --   --  1.7  PHOS  --   --   --   --   --  4.3   Liver Function Tests:  Recent Labs Lab 10/24/16 0816  AST 19  ALT 14  ALKPHOS 92  BILITOT 0.5  PROT 6.6  ALBUMIN 3.3*   No results for input(s): LIPASE, AMYLASE in the last 168 hours. No results for input(s): AMMONIA in the last 168 hours. CBC:  Recent Labs Lab 10/21/16 1816 10/22/16 0323 10/24/16 0816  WBC 4.3 4.9 4.6  NEUTROABS  --  2.0 1.6*  HGB 9.6* 9.3* 10.0*  HCT 30.6* 29.2* 30.6*  MCV 74.3* 73.6* 72.2*  PLT 277 296 273   Cardiac Enzymes:  Recent Labs Lab 10/22/16 0005  TROPONINI <0.03   BNP: Invalid input(s): POCBNP CBG:  Recent Labs Lab 10/23/16 1714 10/23/16 1834 10/23/16 2135 10/24/16 0738 10/24/16 1204  GLUCAP >600* 533* 254* 172* 249*   D-Dimer No results for input(s): DDIMER in the last 72 hours. Hgb A1c  Recent Labs  10/22/16 0317  HGBA1C 13.0*   Lipid Profile No results for input(s): CHOL, HDL, LDLCALC, TRIG, CHOLHDL, LDLDIRECT in the last 72 hours. Thyroid function studies No results for input(s): TSH, T4TOTAL, T3FREE, THYROIDAB in the last 72 hours.  Invalid input(s): FREET3 Anemia work up No results for input(s): VITAMINB12, FOLATE, FERRITIN, TIBC, IRON, RETICCTPCT in the last 72  hours. Urinalysis    Component Value Date/Time   COLORURINE YELLOW 10/21/2016 1816   APPEARANCEUR CLEAR 10/21/2016 1816   LABSPEC 1.024  10/21/2016 1816   PHURINE 6.0 10/21/2016 1816   GLUCOSEU >1000 (A) 10/21/2016 1816   HGBUR NEGATIVE 10/21/2016 1816   BILIRUBINUR NEGATIVE 10/21/2016 1816   BILIRUBINUR n 06/16/2015 1244   KETONESUR NEGATIVE 10/21/2016 1816   PROTEINUR NEGATIVE 10/21/2016 1816   UROBILINOGEN 0.2 07/30/2015 1415   NITRITE NEGATIVE 10/21/2016 1816   LEUKOCYTESUR SMALL (A) 10/21/2016 1816   Sepsis Labs Invalid input(s): PROCALCITONIN,  WBC,  LACTICIDVEN Microbiology Recent Results (from the past 240 hour(s))  MRSA PCR Screening     Status: None   Collection Time: 10/21/16  9:46 PM  Result Value Ref Range Status   MRSA by PCR NEGATIVE NEGATIVE Final    Comment:        The GeneXpert MRSA Assay (FDA approved for NASAL specimens only), is one component of a comprehensive MRSA colonization surveillance program. It is not intended to diagnose MRSA infection nor to guide or monitor treatment for MRSA infections.    Time coordinating discharge: Over 30 minutes  SIGNED:  Merlene Laughter, DO [facility] 10/24/2016, 12:27 PM Pager [phone]  If 7PM-7AM, please contact night-coverage [email or web address] Password [email or web address]

## 2016-10-24 NOTE — Progress Notes (Signed)
Pt has a bed at [name] for today if stable for d/c. CSW will assist with d/c planning to SNF.  Cori Razor LCSW [phone]

## 2016-10-24 NOTE — Progress Notes (Signed)
Pt ready to return to [name] today. Pt is in agreement with plan. [facility] transport required. Medical necessity form completed. D/C Summary sent to SNF for review. Scripts included in d/c packet. # for report provided to nsg.  Cori Razor LCSW [phone]

## 2016-10-24 NOTE — Progress Notes (Signed)
Called [facility] to give report I was put on hold for 10 minutes , called the second time nurse wasn't ready for report ,left my phone number with the secretory to call me when nurse is ready for report

## 2016-10-25 DIAGNOSIS — I5032 Chronic diastolic (congestive) heart failure: Secondary | ICD-10-CM | POA: Diagnosis not present

## 2016-10-25 DIAGNOSIS — E785 Hyperlipidemia, unspecified: Secondary | ICD-10-CM | POA: Diagnosis not present

## 2016-10-25 DIAGNOSIS — Z5181 Encounter for therapeutic drug level monitoring: Secondary | ICD-10-CM | POA: Diagnosis not present

## 2016-10-25 DIAGNOSIS — E559 Vitamin D deficiency, unspecified: Secondary | ICD-10-CM | POA: Diagnosis not present

## 2016-10-25 DIAGNOSIS — R278 Other lack of coordination: Secondary | ICD-10-CM | POA: Diagnosis not present

## 2016-10-25 DIAGNOSIS — K219 Gastro-esophageal reflux disease without esophagitis: Secondary | ICD-10-CM | POA: Diagnosis not present

## 2016-10-25 DIAGNOSIS — I5042 Chronic combined systolic (congestive) and diastolic (congestive) heart failure: Secondary | ICD-10-CM | POA: Diagnosis not present

## 2016-10-25 DIAGNOSIS — I10 Essential (primary) hypertension: Secondary | ICD-10-CM | POA: Diagnosis not present

## 2016-10-25 DIAGNOSIS — R2689 Other abnormalities of gait and mobility: Secondary | ICD-10-CM | POA: Diagnosis not present

## 2016-10-25 DIAGNOSIS — E119 Type 2 diabetes mellitus without complications: Secondary | ICD-10-CM | POA: Diagnosis not present

## 2016-10-25 DIAGNOSIS — M6281 Muscle weakness (generalized): Secondary | ICD-10-CM | POA: Diagnosis not present

## 2016-10-25 DIAGNOSIS — E1069 Type 1 diabetes mellitus with other specified complication: Secondary | ICD-10-CM | POA: Diagnosis not present

## 2016-10-25 DIAGNOSIS — E039 Hypothyroidism, unspecified: Secondary | ICD-10-CM | POA: Diagnosis not present

## 2016-10-25 DIAGNOSIS — Z952 Presence of prosthetic heart valve: Secondary | ICD-10-CM | POA: Diagnosis not present

## 2016-10-25 DIAGNOSIS — K769 Liver disease, unspecified: Secondary | ICD-10-CM | POA: Diagnosis not present

## 2016-10-25 DIAGNOSIS — D649 Anemia, unspecified: Secondary | ICD-10-CM | POA: Diagnosis not present

## 2016-10-26 DIAGNOSIS — E441 Mild protein-calorie malnutrition: Secondary | ICD-10-CM | POA: Diagnosis not present

## 2016-10-26 DIAGNOSIS — R278 Other lack of coordination: Secondary | ICD-10-CM | POA: Diagnosis not present

## 2016-10-26 DIAGNOSIS — I5042 Chronic combined systolic (congestive) and diastolic (congestive) heart failure: Secondary | ICD-10-CM | POA: Diagnosis not present

## 2016-10-26 DIAGNOSIS — N179 Acute kidney failure, unspecified: Secondary | ICD-10-CM | POA: Diagnosis not present

## 2016-10-26 DIAGNOSIS — K219 Gastro-esophageal reflux disease without esophagitis: Secondary | ICD-10-CM | POA: Diagnosis not present

## 2016-10-26 DIAGNOSIS — Z7901 Long term (current) use of anticoagulants: Secondary | ICD-10-CM | POA: Diagnosis not present

## 2016-10-26 DIAGNOSIS — E1069 Type 1 diabetes mellitus with other specified complication: Secondary | ICD-10-CM | POA: Diagnosis not present

## 2016-10-26 DIAGNOSIS — R2689 Other abnormalities of gait and mobility: Secondary | ICD-10-CM | POA: Diagnosis not present

## 2016-10-26 DIAGNOSIS — F329 Major depressive disorder, single episode, unspecified: Secondary | ICD-10-CM | POA: Diagnosis not present

## 2016-10-26 DIAGNOSIS — F418 Other specified anxiety disorders: Secondary | ICD-10-CM | POA: Diagnosis not present

## 2016-10-26 DIAGNOSIS — E784 Other hyperlipidemia: Secondary | ICD-10-CM | POA: Diagnosis not present

## 2016-10-26 DIAGNOSIS — E785 Hyperlipidemia, unspecified: Secondary | ICD-10-CM | POA: Diagnosis not present

## 2016-10-26 DIAGNOSIS — Z952 Presence of prosthetic heart valve: Secondary | ICD-10-CM | POA: Diagnosis not present

## 2016-10-26 DIAGNOSIS — I10 Essential (primary) hypertension: Secondary | ICD-10-CM | POA: Diagnosis not present

## 2016-10-26 DIAGNOSIS — E1010 Type 1 diabetes mellitus with ketoacidosis without coma: Secondary | ICD-10-CM | POA: Diagnosis not present

## 2016-10-26 DIAGNOSIS — M6281 Muscle weakness (generalized): Secondary | ICD-10-CM | POA: Diagnosis not present

## 2016-10-26 DIAGNOSIS — Z5181 Encounter for therapeutic drug level monitoring: Secondary | ICD-10-CM | POA: Diagnosis not present

## 2016-10-29 DIAGNOSIS — R2689 Other abnormalities of gait and mobility: Secondary | ICD-10-CM | POA: Diagnosis not present

## 2016-10-29 DIAGNOSIS — R278 Other lack of coordination: Secondary | ICD-10-CM | POA: Diagnosis not present

## 2016-10-29 DIAGNOSIS — M6281 Muscle weakness (generalized): Secondary | ICD-10-CM | POA: Diagnosis not present

## 2016-10-29 DIAGNOSIS — K219 Gastro-esophageal reflux disease without esophagitis: Secondary | ICD-10-CM | POA: Diagnosis not present

## 2016-10-29 DIAGNOSIS — Z5181 Encounter for therapeutic drug level monitoring: Secondary | ICD-10-CM | POA: Diagnosis not present

## 2016-10-29 DIAGNOSIS — I10 Essential (primary) hypertension: Secondary | ICD-10-CM | POA: Diagnosis not present

## 2016-10-29 DIAGNOSIS — E785 Hyperlipidemia, unspecified: Secondary | ICD-10-CM | POA: Diagnosis not present

## 2016-10-29 DIAGNOSIS — E1069 Type 1 diabetes mellitus with other specified complication: Secondary | ICD-10-CM | POA: Diagnosis not present

## 2016-10-29 DIAGNOSIS — I5042 Chronic combined systolic (congestive) and diastolic (congestive) heart failure: Secondary | ICD-10-CM | POA: Diagnosis not present

## 2016-10-30 ENCOUNTER — Other Ambulatory Visit: Payer: Self-pay | Admitting: *Deleted

## 2016-10-30 NOTE — Patient Outreach (Signed)
[facility]) Care Management Post-Acute Care Coordination  10/30/2016  Madison [name] 03/04/1961 [id number]   Met with [name], LCSW for [facility]. Reviewed patient case. she confirms that patient is a [facility] resident of facility and there are no plans to discharge at this time.  RNCM will sign off case.  Royetta Crochet. Laymond Purser, RN, BSN, [place] Post-Acute Care Coordinator [phone]

## 2016-10-31 DIAGNOSIS — E785 Hyperlipidemia, unspecified: Secondary | ICD-10-CM | POA: Diagnosis not present

## 2016-10-31 DIAGNOSIS — I10 Essential (primary) hypertension: Secondary | ICD-10-CM | POA: Diagnosis not present

## 2016-10-31 DIAGNOSIS — R2689 Other abnormalities of gait and mobility: Secondary | ICD-10-CM | POA: Diagnosis not present

## 2016-10-31 DIAGNOSIS — M6281 Muscle weakness (generalized): Secondary | ICD-10-CM | POA: Diagnosis not present

## 2016-10-31 DIAGNOSIS — Z5181 Encounter for therapeutic drug level monitoring: Secondary | ICD-10-CM | POA: Diagnosis not present

## 2016-10-31 DIAGNOSIS — R278 Other lack of coordination: Secondary | ICD-10-CM | POA: Diagnosis not present

## 2016-10-31 DIAGNOSIS — E1069 Type 1 diabetes mellitus with other specified complication: Secondary | ICD-10-CM | POA: Diagnosis not present

## 2016-10-31 DIAGNOSIS — I5042 Chronic combined systolic (congestive) and diastolic (congestive) heart failure: Secondary | ICD-10-CM | POA: Diagnosis not present

## 2016-10-31 DIAGNOSIS — K219 Gastro-esophageal reflux disease without esophagitis: Secondary | ICD-10-CM | POA: Diagnosis not present

## 2016-11-02 DIAGNOSIS — E785 Hyperlipidemia, unspecified: Secondary | ICD-10-CM | POA: Diagnosis not present

## 2016-11-02 DIAGNOSIS — Z5181 Encounter for therapeutic drug level monitoring: Secondary | ICD-10-CM | POA: Diagnosis not present

## 2016-11-02 DIAGNOSIS — I5042 Chronic combined systolic (congestive) and diastolic (congestive) heart failure: Secondary | ICD-10-CM | POA: Diagnosis not present

## 2016-11-02 DIAGNOSIS — E1069 Type 1 diabetes mellitus with other specified complication: Secondary | ICD-10-CM | POA: Diagnosis not present

## 2016-11-02 DIAGNOSIS — R278 Other lack of coordination: Secondary | ICD-10-CM | POA: Diagnosis not present

## 2016-11-02 DIAGNOSIS — M6281 Muscle weakness (generalized): Secondary | ICD-10-CM | POA: Diagnosis not present

## 2016-11-02 DIAGNOSIS — K219 Gastro-esophageal reflux disease without esophagitis: Secondary | ICD-10-CM | POA: Diagnosis not present

## 2016-11-02 DIAGNOSIS — I10 Essential (primary) hypertension: Secondary | ICD-10-CM | POA: Diagnosis not present

## 2016-11-02 DIAGNOSIS — R2689 Other abnormalities of gait and mobility: Secondary | ICD-10-CM | POA: Diagnosis not present

## 2016-11-05 DIAGNOSIS — R278 Other lack of coordination: Secondary | ICD-10-CM | POA: Diagnosis not present

## 2016-11-05 DIAGNOSIS — I5042 Chronic combined systolic (congestive) and diastolic (congestive) heart failure: Secondary | ICD-10-CM | POA: Diagnosis not present

## 2016-11-05 DIAGNOSIS — R2689 Other abnormalities of gait and mobility: Secondary | ICD-10-CM | POA: Diagnosis not present

## 2016-11-05 DIAGNOSIS — M6281 Muscle weakness (generalized): Secondary | ICD-10-CM | POA: Diagnosis not present

## 2016-11-05 DIAGNOSIS — Z5181 Encounter for therapeutic drug level monitoring: Secondary | ICD-10-CM | POA: Diagnosis not present

## 2016-11-05 DIAGNOSIS — E1069 Type 1 diabetes mellitus with other specified complication: Secondary | ICD-10-CM | POA: Diagnosis not present

## 2016-11-05 DIAGNOSIS — K219 Gastro-esophageal reflux disease without esophagitis: Secondary | ICD-10-CM | POA: Diagnosis not present

## 2016-11-05 DIAGNOSIS — I10 Essential (primary) hypertension: Secondary | ICD-10-CM | POA: Diagnosis not present

## 2016-11-05 DIAGNOSIS — E785 Hyperlipidemia, unspecified: Secondary | ICD-10-CM | POA: Diagnosis not present

## 2016-11-07 DIAGNOSIS — M6281 Muscle weakness (generalized): Secondary | ICD-10-CM | POA: Diagnosis not present

## 2016-11-07 DIAGNOSIS — Z5181 Encounter for therapeutic drug level monitoring: Secondary | ICD-10-CM | POA: Diagnosis not present

## 2016-11-07 DIAGNOSIS — K219 Gastro-esophageal reflux disease without esophagitis: Secondary | ICD-10-CM | POA: Diagnosis not present

## 2016-11-07 DIAGNOSIS — R2689 Other abnormalities of gait and mobility: Secondary | ICD-10-CM | POA: Diagnosis not present

## 2016-11-07 DIAGNOSIS — I10 Essential (primary) hypertension: Secondary | ICD-10-CM | POA: Diagnosis not present

## 2016-11-07 DIAGNOSIS — I5042 Chronic combined systolic (congestive) and diastolic (congestive) heart failure: Secondary | ICD-10-CM | POA: Diagnosis not present

## 2016-11-07 DIAGNOSIS — E1069 Type 1 diabetes mellitus with other specified complication: Secondary | ICD-10-CM | POA: Diagnosis not present

## 2016-11-07 DIAGNOSIS — E785 Hyperlipidemia, unspecified: Secondary | ICD-10-CM | POA: Diagnosis not present

## 2016-11-07 DIAGNOSIS — R278 Other lack of coordination: Secondary | ICD-10-CM | POA: Diagnosis not present

## 2016-11-08 DIAGNOSIS — E785 Hyperlipidemia, unspecified: Secondary | ICD-10-CM | POA: Diagnosis not present

## 2016-11-08 DIAGNOSIS — E1069 Type 1 diabetes mellitus with other specified complication: Secondary | ICD-10-CM | POA: Diagnosis not present

## 2016-11-08 DIAGNOSIS — R278 Other lack of coordination: Secondary | ICD-10-CM | POA: Diagnosis not present

## 2016-11-08 DIAGNOSIS — K219 Gastro-esophageal reflux disease without esophagitis: Secondary | ICD-10-CM | POA: Diagnosis not present

## 2016-11-08 DIAGNOSIS — I5042 Chronic combined systolic (congestive) and diastolic (congestive) heart failure: Secondary | ICD-10-CM | POA: Diagnosis not present

## 2016-11-08 DIAGNOSIS — I10 Essential (primary) hypertension: Secondary | ICD-10-CM | POA: Diagnosis not present

## 2016-11-08 DIAGNOSIS — R2689 Other abnormalities of gait and mobility: Secondary | ICD-10-CM | POA: Diagnosis not present

## 2016-11-08 DIAGNOSIS — Z5181 Encounter for therapeutic drug level monitoring: Secondary | ICD-10-CM | POA: Diagnosis not present

## 2016-11-08 DIAGNOSIS — M6281 Muscle weakness (generalized): Secondary | ICD-10-CM | POA: Diagnosis not present

## 2016-11-09 DIAGNOSIS — I10 Essential (primary) hypertension: Secondary | ICD-10-CM | POA: Diagnosis not present

## 2016-11-09 DIAGNOSIS — I5042 Chronic combined systolic (congestive) and diastolic (congestive) heart failure: Secondary | ICD-10-CM | POA: Diagnosis not present

## 2016-11-09 DIAGNOSIS — M6281 Muscle weakness (generalized): Secondary | ICD-10-CM | POA: Diagnosis not present

## 2016-11-09 DIAGNOSIS — E785 Hyperlipidemia, unspecified: Secondary | ICD-10-CM | POA: Diagnosis not present

## 2016-11-09 DIAGNOSIS — Z5181 Encounter for therapeutic drug level monitoring: Secondary | ICD-10-CM | POA: Diagnosis not present

## 2016-11-09 DIAGNOSIS — R2689 Other abnormalities of gait and mobility: Secondary | ICD-10-CM | POA: Diagnosis not present

## 2016-11-09 DIAGNOSIS — E1069 Type 1 diabetes mellitus with other specified complication: Secondary | ICD-10-CM | POA: Diagnosis not present

## 2016-11-09 DIAGNOSIS — R278 Other lack of coordination: Secondary | ICD-10-CM | POA: Diagnosis not present

## 2016-11-09 DIAGNOSIS — K219 Gastro-esophageal reflux disease without esophagitis: Secondary | ICD-10-CM | POA: Diagnosis not present

## 2016-11-10 DIAGNOSIS — Z5181 Encounter for therapeutic drug level monitoring: Secondary | ICD-10-CM | POA: Diagnosis not present

## 2016-11-10 DIAGNOSIS — I10 Essential (primary) hypertension: Secondary | ICD-10-CM | POA: Diagnosis not present

## 2016-11-10 DIAGNOSIS — E1069 Type 1 diabetes mellitus with other specified complication: Secondary | ICD-10-CM | POA: Diagnosis not present

## 2016-11-10 DIAGNOSIS — R278 Other lack of coordination: Secondary | ICD-10-CM | POA: Diagnosis not present

## 2016-11-10 DIAGNOSIS — R2689 Other abnormalities of gait and mobility: Secondary | ICD-10-CM | POA: Diagnosis not present

## 2016-11-10 DIAGNOSIS — M6281 Muscle weakness (generalized): Secondary | ICD-10-CM | POA: Diagnosis not present

## 2016-11-10 DIAGNOSIS — I5042 Chronic combined systolic (congestive) and diastolic (congestive) heart failure: Secondary | ICD-10-CM | POA: Diagnosis not present

## 2016-11-10 DIAGNOSIS — E785 Hyperlipidemia, unspecified: Secondary | ICD-10-CM | POA: Diagnosis not present

## 2016-11-10 DIAGNOSIS — K219 Gastro-esophageal reflux disease without esophagitis: Secondary | ICD-10-CM | POA: Diagnosis not present

## 2016-11-16 DIAGNOSIS — I5032 Chronic diastolic (congestive) heart failure: Secondary | ICD-10-CM | POA: Diagnosis not present

## 2016-11-16 DIAGNOSIS — E1069 Type 1 diabetes mellitus with other specified complication: Secondary | ICD-10-CM | POA: Diagnosis not present

## 2016-11-16 DIAGNOSIS — K219 Gastro-esophageal reflux disease without esophagitis: Secondary | ICD-10-CM | POA: Diagnosis not present

## 2016-11-16 DIAGNOSIS — R278 Other lack of coordination: Secondary | ICD-10-CM | POA: Diagnosis not present

## 2016-11-16 DIAGNOSIS — I10 Essential (primary) hypertension: Secondary | ICD-10-CM | POA: Diagnosis not present

## 2016-11-16 DIAGNOSIS — M6281 Muscle weakness (generalized): Secondary | ICD-10-CM | POA: Diagnosis not present

## 2016-11-16 DIAGNOSIS — Z5181 Encounter for therapeutic drug level monitoring: Secondary | ICD-10-CM | POA: Diagnosis not present

## 2016-11-16 DIAGNOSIS — E108 Type 1 diabetes mellitus with unspecified complications: Secondary | ICD-10-CM | POA: Diagnosis not present

## 2016-11-16 DIAGNOSIS — E785 Hyperlipidemia, unspecified: Secondary | ICD-10-CM | POA: Diagnosis not present

## 2016-11-16 DIAGNOSIS — R05 Cough: Secondary | ICD-10-CM | POA: Diagnosis not present

## 2016-11-16 DIAGNOSIS — R509 Fever, unspecified: Secondary | ICD-10-CM | POA: Diagnosis not present

## 2016-11-16 DIAGNOSIS — I5042 Chronic combined systolic (congestive) and diastolic (congestive) heart failure: Secondary | ICD-10-CM | POA: Diagnosis not present

## 2016-11-16 DIAGNOSIS — R2689 Other abnormalities of gait and mobility: Secondary | ICD-10-CM | POA: Diagnosis not present

## 2016-11-19 DIAGNOSIS — E785 Hyperlipidemia, unspecified: Secondary | ICD-10-CM | POA: Diagnosis not present

## 2016-11-19 DIAGNOSIS — I5042 Chronic combined systolic (congestive) and diastolic (congestive) heart failure: Secondary | ICD-10-CM | POA: Diagnosis not present

## 2016-11-19 DIAGNOSIS — I10 Essential (primary) hypertension: Secondary | ICD-10-CM | POA: Diagnosis not present

## 2016-11-19 DIAGNOSIS — F331 Major depressive disorder, recurrent, moderate: Secondary | ICD-10-CM | POA: Diagnosis not present

## 2016-11-19 DIAGNOSIS — F411 Generalized anxiety disorder: Secondary | ICD-10-CM | POA: Diagnosis not present

## 2016-11-19 DIAGNOSIS — Z5181 Encounter for therapeutic drug level monitoring: Secondary | ICD-10-CM | POA: Diagnosis not present

## 2016-11-19 DIAGNOSIS — K219 Gastro-esophageal reflux disease without esophagitis: Secondary | ICD-10-CM | POA: Diagnosis not present

## 2016-11-19 DIAGNOSIS — E1069 Type 1 diabetes mellitus with other specified complication: Secondary | ICD-10-CM | POA: Diagnosis not present

## 2016-11-19 DIAGNOSIS — Z7901 Long term (current) use of anticoagulants: Secondary | ICD-10-CM | POA: Diagnosis not present

## 2016-11-21 DIAGNOSIS — I10 Essential (primary) hypertension: Secondary | ICD-10-CM | POA: Diagnosis not present

## 2016-11-21 DIAGNOSIS — F411 Generalized anxiety disorder: Secondary | ICD-10-CM | POA: Diagnosis not present

## 2016-11-21 DIAGNOSIS — E785 Hyperlipidemia, unspecified: Secondary | ICD-10-CM | POA: Diagnosis not present

## 2016-11-21 DIAGNOSIS — Z7901 Long term (current) use of anticoagulants: Secondary | ICD-10-CM | POA: Diagnosis not present

## 2016-11-21 DIAGNOSIS — K219 Gastro-esophageal reflux disease without esophagitis: Secondary | ICD-10-CM | POA: Diagnosis not present

## 2016-11-21 DIAGNOSIS — F331 Major depressive disorder, recurrent, moderate: Secondary | ICD-10-CM | POA: Diagnosis not present

## 2016-11-21 DIAGNOSIS — E1069 Type 1 diabetes mellitus with other specified complication: Secondary | ICD-10-CM | POA: Diagnosis not present

## 2016-11-21 DIAGNOSIS — Z5181 Encounter for therapeutic drug level monitoring: Secondary | ICD-10-CM | POA: Diagnosis not present

## 2016-11-21 DIAGNOSIS — I5042 Chronic combined systolic (congestive) and diastolic (congestive) heart failure: Secondary | ICD-10-CM | POA: Diagnosis not present

## 2016-11-23 DIAGNOSIS — K219 Gastro-esophageal reflux disease without esophagitis: Secondary | ICD-10-CM | POA: Diagnosis not present

## 2016-11-23 DIAGNOSIS — F331 Major depressive disorder, recurrent, moderate: Secondary | ICD-10-CM | POA: Diagnosis not present

## 2016-11-23 DIAGNOSIS — Z7901 Long term (current) use of anticoagulants: Secondary | ICD-10-CM | POA: Diagnosis not present

## 2016-11-23 DIAGNOSIS — I5042 Chronic combined systolic (congestive) and diastolic (congestive) heart failure: Secondary | ICD-10-CM | POA: Diagnosis not present

## 2016-11-23 DIAGNOSIS — I10 Essential (primary) hypertension: Secondary | ICD-10-CM | POA: Diagnosis not present

## 2016-11-23 DIAGNOSIS — Z5181 Encounter for therapeutic drug level monitoring: Secondary | ICD-10-CM | POA: Diagnosis not present

## 2016-11-23 DIAGNOSIS — F411 Generalized anxiety disorder: Secondary | ICD-10-CM | POA: Diagnosis not present

## 2016-11-23 DIAGNOSIS — E1069 Type 1 diabetes mellitus with other specified complication: Secondary | ICD-10-CM | POA: Diagnosis not present

## 2016-11-23 DIAGNOSIS — E785 Hyperlipidemia, unspecified: Secondary | ICD-10-CM | POA: Diagnosis not present

## 2016-11-26 DIAGNOSIS — E785 Hyperlipidemia, unspecified: Secondary | ICD-10-CM | POA: Diagnosis not present

## 2016-11-26 DIAGNOSIS — I10 Essential (primary) hypertension: Secondary | ICD-10-CM | POA: Diagnosis not present

## 2016-11-26 DIAGNOSIS — E1069 Type 1 diabetes mellitus with other specified complication: Secondary | ICD-10-CM | POA: Diagnosis not present

## 2016-11-26 DIAGNOSIS — I5042 Chronic combined systolic (congestive) and diastolic (congestive) heart failure: Secondary | ICD-10-CM | POA: Diagnosis not present

## 2016-11-26 DIAGNOSIS — K219 Gastro-esophageal reflux disease without esophagitis: Secondary | ICD-10-CM | POA: Diagnosis not present

## 2016-11-26 DIAGNOSIS — F411 Generalized anxiety disorder: Secondary | ICD-10-CM | POA: Diagnosis not present

## 2016-11-26 DIAGNOSIS — Z5181 Encounter for therapeutic drug level monitoring: Secondary | ICD-10-CM | POA: Diagnosis not present

## 2016-11-26 DIAGNOSIS — F331 Major depressive disorder, recurrent, moderate: Secondary | ICD-10-CM | POA: Diagnosis not present

## 2016-11-26 DIAGNOSIS — Z7901 Long term (current) use of anticoagulants: Secondary | ICD-10-CM | POA: Diagnosis not present

## 2016-11-28 DIAGNOSIS — Z5181 Encounter for therapeutic drug level monitoring: Secondary | ICD-10-CM | POA: Diagnosis not present

## 2016-11-28 DIAGNOSIS — I10 Essential (primary) hypertension: Secondary | ICD-10-CM | POA: Diagnosis not present

## 2016-11-28 DIAGNOSIS — K219 Gastro-esophageal reflux disease without esophagitis: Secondary | ICD-10-CM | POA: Diagnosis not present

## 2016-11-28 DIAGNOSIS — E785 Hyperlipidemia, unspecified: Secondary | ICD-10-CM | POA: Diagnosis not present

## 2016-11-28 DIAGNOSIS — Z7901 Long term (current) use of anticoagulants: Secondary | ICD-10-CM | POA: Diagnosis not present

## 2016-11-28 DIAGNOSIS — E1069 Type 1 diabetes mellitus with other specified complication: Secondary | ICD-10-CM | POA: Diagnosis not present

## 2016-11-28 DIAGNOSIS — F331 Major depressive disorder, recurrent, moderate: Secondary | ICD-10-CM | POA: Diagnosis not present

## 2016-11-28 DIAGNOSIS — I5042 Chronic combined systolic (congestive) and diastolic (congestive) heart failure: Secondary | ICD-10-CM | POA: Diagnosis not present

## 2016-11-28 DIAGNOSIS — F411 Generalized anxiety disorder: Secondary | ICD-10-CM | POA: Diagnosis not present

## 2016-11-29 ENCOUNTER — Encounter (HOSPITAL_COMMUNITY): Payer: Self-pay

## 2016-11-29 ENCOUNTER — Emergency Department (HOSPITAL_COMMUNITY): Payer: Medicare HMO

## 2016-11-29 ENCOUNTER — Inpatient Hospital Stay (HOSPITAL_COMMUNITY)
Admission: EM | Admit: 2016-11-29 | Discharge: 2016-12-04 | DRG: 481 | Disposition: A | Discharge status: Skilled Nursing Facility | Payer: Medicare HMO | Attending: Internal Medicine | Admitting: Internal Medicine

## 2016-11-29 DIAGNOSIS — Z4789 Encounter for other orthopedic aftercare: Secondary | ICD-10-CM | POA: Diagnosis not present

## 2016-11-29 DIAGNOSIS — R52 Pain, unspecified: Secondary | ICD-10-CM

## 2016-11-29 DIAGNOSIS — M6281 Muscle weakness (generalized): Secondary | ICD-10-CM | POA: Diagnosis not present

## 2016-11-29 DIAGNOSIS — S299XXA Unspecified injury of thorax, initial encounter: Secondary | ICD-10-CM | POA: Diagnosis not present

## 2016-11-29 DIAGNOSIS — Z951 Presence of aortocoronary bypass graft: Secondary | ICD-10-CM | POA: Diagnosis not present

## 2016-11-29 DIAGNOSIS — Z79899 Other long term (current) drug therapy: Secondary | ICD-10-CM | POA: Diagnosis not present

## 2016-11-29 DIAGNOSIS — I69934 Monoplegia of upper limb following unspecified cerebrovascular disease affecting left non-dominant side: Secondary | ICD-10-CM

## 2016-11-29 DIAGNOSIS — F329 Major depressive disorder, single episode, unspecified: Secondary | ICD-10-CM | POA: Diagnosis present

## 2016-11-29 DIAGNOSIS — I252 Old myocardial infarction: Secondary | ICD-10-CM

## 2016-11-29 DIAGNOSIS — T884XXD Failed or difficult intubation, subsequent encounter: Secondary | ICD-10-CM

## 2016-11-29 DIAGNOSIS — Z7901 Long term (current) use of anticoagulants: Secondary | ICD-10-CM | POA: Diagnosis not present

## 2016-11-29 DIAGNOSIS — F419 Anxiety disorder, unspecified: Secondary | ICD-10-CM | POA: Diagnosis not present

## 2016-11-29 DIAGNOSIS — S7222XA Displaced subtrochanteric fracture of left femur, initial encounter for closed fracture: Secondary | ICD-10-CM | POA: Diagnosis present

## 2016-11-29 DIAGNOSIS — S72002A Fracture of unspecified part of neck of left femur, initial encounter for closed fracture: Secondary | ICD-10-CM

## 2016-11-29 DIAGNOSIS — D62 Acute posthemorrhagic anemia: Secondary | ICD-10-CM | POA: Diagnosis not present

## 2016-11-29 DIAGNOSIS — E784 Other hyperlipidemia: Secondary | ICD-10-CM | POA: Diagnosis not present

## 2016-11-29 DIAGNOSIS — I5043 Acute on chronic combined systolic (congestive) and diastolic (congestive) heart failure: Secondary | ICD-10-CM | POA: Diagnosis not present

## 2016-11-29 DIAGNOSIS — M069 Rheumatoid arthritis, unspecified: Secondary | ICD-10-CM | POA: Diagnosis present

## 2016-11-29 DIAGNOSIS — R531 Weakness: Secondary | ICD-10-CM | POA: Diagnosis not present

## 2016-11-29 DIAGNOSIS — I5042 Chronic combined systolic (congestive) and diastolic (congestive) heart failure: Secondary | ICD-10-CM | POA: Diagnosis not present

## 2016-11-29 DIAGNOSIS — N179 Acute kidney failure, unspecified: Secondary | ICD-10-CM | POA: Diagnosis not present

## 2016-11-29 DIAGNOSIS — Y92099 Unspecified place in other non-institutional residence as the place of occurrence of the external cause: Secondary | ICD-10-CM

## 2016-11-29 DIAGNOSIS — IMO0002 Reserved for concepts with insufficient information to code with codable children: Secondary | ICD-10-CM | POA: Diagnosis present

## 2016-11-29 DIAGNOSIS — I110 Hypertensive heart disease with heart failure: Secondary | ICD-10-CM | POA: Diagnosis present

## 2016-11-29 DIAGNOSIS — T380X5A Adverse effect of glucocorticoids and synthetic analogues, initial encounter: Secondary | ICD-10-CM | POA: Diagnosis present

## 2016-11-29 DIAGNOSIS — Z794 Long term (current) use of insulin: Secondary | ICD-10-CM | POA: Diagnosis not present

## 2016-11-29 DIAGNOSIS — M25552 Pain in left hip: Secondary | ICD-10-CM | POA: Diagnosis not present

## 2016-11-29 DIAGNOSIS — Z823 Family history of stroke: Secondary | ICD-10-CM

## 2016-11-29 DIAGNOSIS — T148XXA Other injury of unspecified body region, initial encounter: Secondary | ICD-10-CM | POA: Diagnosis not present

## 2016-11-29 DIAGNOSIS — Z952 Presence of prosthetic heart valve: Secondary | ICD-10-CM

## 2016-11-29 DIAGNOSIS — K219 Gastro-esophageal reflux disease without esophagitis: Secondary | ICD-10-CM | POA: Diagnosis not present

## 2016-11-29 DIAGNOSIS — E1065 Type 1 diabetes mellitus with hyperglycemia: Secondary | ICD-10-CM | POA: Diagnosis present

## 2016-11-29 DIAGNOSIS — T884XXA Failed or difficult intubation, initial encounter: Secondary | ICD-10-CM | POA: Diagnosis present

## 2016-11-29 DIAGNOSIS — I5032 Chronic diastolic (congestive) heart failure: Secondary | ICD-10-CM | POA: Diagnosis not present

## 2016-11-29 DIAGNOSIS — R278 Other lack of coordination: Secondary | ICD-10-CM | POA: Diagnosis not present

## 2016-11-29 DIAGNOSIS — S72142A Displaced intertrochanteric fracture of left femur, initial encounter for closed fracture: Principal | ICD-10-CM

## 2016-11-29 DIAGNOSIS — W010XXA Fall on same level from slipping, tripping and stumbling without subsequent striking against object, initial encounter: Secondary | ICD-10-CM | POA: Diagnosis present

## 2016-11-29 DIAGNOSIS — Z8249 Family history of ischemic heart disease and other diseases of the circulatory system: Secondary | ICD-10-CM

## 2016-11-29 DIAGNOSIS — S8262XA Displaced fracture of lateral malleolus of left fibula, initial encounter for closed fracture: Secondary | ICD-10-CM | POA: Diagnosis not present

## 2016-11-29 DIAGNOSIS — E785 Hyperlipidemia, unspecified: Secondary | ICD-10-CM | POA: Diagnosis not present

## 2016-11-29 DIAGNOSIS — I10 Essential (primary) hypertension: Secondary | ICD-10-CM | POA: Diagnosis not present

## 2016-11-29 DIAGNOSIS — G4709 Other insomnia: Secondary | ICD-10-CM | POA: Diagnosis not present

## 2016-11-29 DIAGNOSIS — F418 Other specified anxiety disorders: Secondary | ICD-10-CM | POA: Diagnosis not present

## 2016-11-29 DIAGNOSIS — I2510 Atherosclerotic heart disease of native coronary artery without angina pectoris: Secondary | ICD-10-CM | POA: Diagnosis present

## 2016-11-29 DIAGNOSIS — M109 Gout, unspecified: Secondary | ICD-10-CM | POA: Diagnosis present

## 2016-11-29 DIAGNOSIS — S72142D Displaced intertrochanteric fracture of left femur, subsequent encounter for closed fracture with routine healing: Secondary | ICD-10-CM | POA: Diagnosis not present

## 2016-11-29 DIAGNOSIS — R2689 Other abnormalities of gait and mobility: Secondary | ICD-10-CM | POA: Diagnosis not present

## 2016-11-29 DIAGNOSIS — R079 Chest pain, unspecified: Secondary | ICD-10-CM

## 2016-11-29 DIAGNOSIS — Z955 Presence of coronary angioplasty implant and graft: Secondary | ICD-10-CM

## 2016-11-29 DIAGNOSIS — Z5181 Encounter for therapeutic drug level monitoring: Secondary | ICD-10-CM | POA: Diagnosis not present

## 2016-11-29 DIAGNOSIS — E1010 Type 1 diabetes mellitus with ketoacidosis without coma: Secondary | ICD-10-CM | POA: Diagnosis not present

## 2016-11-29 DIAGNOSIS — S72009A Fracture of unspecified part of neck of unspecified femur, initial encounter for closed fracture: Secondary | ICD-10-CM | POA: Diagnosis not present

## 2016-11-29 LAB — CBC WITH DIFFERENTIAL/PLATELET
Basophils Absolute: 0 10*3/uL (ref 0.0–0.1)
Basophils Relative: 0 %
EOS ABS: 0.1 10*3/uL (ref 0.0–0.7)
Eosinophils Relative: 1 %
HEMATOCRIT: 33 % — AB (ref 36.0–46.0)
HEMOGLOBIN: 10.3 g/dL — AB (ref 12.0–15.0)
LYMPHS ABS: 1.8 10*3/uL (ref 0.7–4.0)
LYMPHS PCT: 22 %
MCH: 23.1 pg — AB (ref 26.0–34.0)
MCHC: 31.2 g/dL (ref 30.0–36.0)
MCV: 74.2 fL — AB (ref 78.0–100.0)
MONOS PCT: 5 %
Monocytes Absolute: 0.5 10*3/uL (ref 0.1–1.0)
NEUTROS ABS: 6.1 10*3/uL (ref 1.7–7.7)
NEUTROS PCT: 72 %
Platelets: 338 10*3/uL (ref 150–400)
RBC: 4.45 MIL/uL (ref 3.87–5.11)
RDW: 17 % — ABNORMAL HIGH (ref 11.5–15.5)
WBC: 8.5 10*3/uL (ref 4.0–10.5)

## 2016-11-29 LAB — PROTIME-INR
INR: 1.43
PROTHROMBIN TIME: 17.5 s — AB (ref 11.4–15.2)

## 2016-11-29 LAB — TYPE AND SCREEN
ABO/RH(D): A POS
Antibody Screen: NEGATIVE

## 2016-11-29 LAB — GLUCOSE, CAPILLARY: Glucose-Capillary: 315 mg/dL — ABNORMAL HIGH (ref 65–99)

## 2016-11-29 LAB — BASIC METABOLIC PANEL
ANION GAP: 11 (ref 5–15)
BUN: 18 mg/dL (ref 6–20)
CHLORIDE: 96 mmol/L — AB (ref 101–111)
CO2: 25 mmol/L (ref 22–32)
Calcium: 9.2 mg/dL (ref 8.9–10.3)
Creatinine, Ser: 0.9 mg/dL (ref 0.44–1.00)
GFR calc non Af Amer: >60 mL/min (ref >60)
Glucose, Bld: 256 mg/dL — ABNORMAL HIGH (ref 65–99)
POTASSIUM: 4.3 mmol/L (ref 3.5–5.1)
SODIUM: 132 mmol/L — AB (ref 135–145)

## 2016-11-29 MED ORDER — HYDROMORPHONE HCL 2 MG/ML IJ SOLN
1.0000 mg | Freq: Once | INTRAMUSCULAR | Status: AC
  Administered 2016-11-29: 1 mg via INTRAMUSCULAR
  Filled 2016-11-29: qty 1

## 2016-11-29 MED ORDER — DOCUSATE SODIUM 100 MG PO CAPS
100.0000 mg | ORAL_CAPSULE | Freq: Two times a day (BID) | ORAL | Status: DC
  Administered 2016-11-29 – 2016-12-04 (×9): 100 mg via ORAL
  Filled 2016-11-29 (×9): qty 1

## 2016-11-29 MED ORDER — PRAVASTATIN SODIUM 20 MG PO TABS
20.0000 mg | ORAL_TABLET | Freq: Every evening | ORAL | Status: DC
  Administered 2016-11-29 – 2016-12-03 (×5): 20 mg via ORAL
  Filled 2016-11-29 (×5): qty 1

## 2016-11-29 MED ORDER — SODIUM CHLORIDE 0.9 % IV SOLN
INTRAVENOUS | Status: DC
  Administered 2016-11-29: 23:00:00 via INTRAVENOUS

## 2016-11-29 MED ORDER — HYDROMORPHONE HCL 2 MG/ML IJ SOLN
0.5000 mg | INTRAMUSCULAR | Status: DC | PRN
  Administered 2016-11-29 – 2016-12-04 (×12): 0.5 mg via INTRAVENOUS
  Filled 2016-11-29 (×12): qty 1

## 2016-11-29 MED ORDER — PANTOPRAZOLE SODIUM 40 MG PO TBEC
80.0000 mg | DELAYED_RELEASE_TABLET | Freq: Every day | ORAL | Status: DC
  Administered 2016-12-01 – 2016-12-04 (×4): 80 mg via ORAL
  Filled 2016-11-29 (×4): qty 2

## 2016-11-29 MED ORDER — CITALOPRAM HYDROBROMIDE 40 MG PO TABS
40.0000 mg | ORAL_TABLET | Freq: Every day | ORAL | Status: DC
  Administered 2016-12-01 – 2016-12-04 (×4): 40 mg via ORAL
  Filled 2016-11-29 (×4): qty 1

## 2016-11-29 MED ORDER — POVIDONE-IODINE 10 % EX SWAB: 2.0000 "application " | Freq: Once | CUTANEOUS | Status: DC

## 2016-11-29 MED ORDER — HYDROCODONE-ACETAMINOPHEN 5-325 MG PO TABS
1.0000 | ORAL_TABLET | Freq: Four times a day (QID) | ORAL | Status: DC | PRN
  Administered 2016-11-29 – 2016-12-04 (×16): 2 via ORAL
  Administered 2016-12-04: 1 via ORAL
  Administered 2016-12-04: 2 via ORAL
  Filled 2016-11-29 (×6): qty 2
  Filled 2016-11-29: qty 1
  Filled 2016-11-29 (×11): qty 2

## 2016-11-29 MED ORDER — INSULIN DETEMIR 100 UNIT/ML ~~LOC~~ SOLN
7.0000 [IU] | Freq: Two times a day (BID) | SUBCUTANEOUS | Status: DC
  Administered 2016-11-29 – 2016-12-02 (×5): 7 [IU] via SUBCUTANEOUS
  Filled 2016-11-29 (×7): qty 0.07

## 2016-11-29 MED ORDER — INSULIN ASPART 100 UNIT/ML ~~LOC~~ SOLN
0.0000 [IU] | SUBCUTANEOUS | Status: DC
  Administered 2016-11-30: 3 [IU] via SUBCUTANEOUS
  Administered 2016-11-30: 7 [IU] via SUBCUTANEOUS
  Administered 2016-11-30: 2 [IU] via SUBCUTANEOUS
  Administered 2016-11-30: 9 [IU] via SUBCUTANEOUS
  Administered 2016-11-30: 3 [IU] via SUBCUTANEOUS
  Administered 2016-12-01: 5 [IU] via SUBCUTANEOUS
  Administered 2016-12-01 (×2): 9 [IU] via SUBCUTANEOUS
  Administered 2016-12-01 (×2): 2 [IU] via SUBCUTANEOUS
  Administered 2016-12-02: 5 [IU] via SUBCUTANEOUS
  Administered 2016-12-02: 9 [IU] via SUBCUTANEOUS
  Administered 2016-12-02: 7 [IU] via SUBCUTANEOUS
  Administered 2016-12-02 (×2): 5 [IU] via SUBCUTANEOUS
  Administered 2016-12-03: 3 [IU] via SUBCUTANEOUS
  Administered 2016-12-03: 5 [IU] via SUBCUTANEOUS
  Administered 2016-12-03: 9 [IU] via SUBCUTANEOUS
  Administered 2016-12-03: 3 [IU] via SUBCUTANEOUS
  Administered 2016-12-03: 10 [IU] via SUBCUTANEOUS
  Administered 2016-12-04 (×2): 5 [IU] via SUBCUTANEOUS

## 2016-11-29 MED ORDER — CEFAZOLIN SODIUM-DEXTROSE 2-4 GM/100ML-% IV SOLN
2.0000 g | INTRAVENOUS | Status: AC
  Administered 2016-11-30: 2 g via INTRAVENOUS
  Filled 2016-11-29: qty 100

## 2016-11-29 MED ORDER — MORPHINE SULFATE (PF) 4 MG/ML IV SOLN: 0.5000 mg | INTRAVENOUS | Status: DC | PRN

## 2016-11-29 MED ORDER — HEPARIN BOLUS VIA INFUSION
4000.0000 [IU] | Freq: Once | INTRAVENOUS | Status: AC
  Administered 2016-11-29: 4000 [IU] via INTRAVENOUS
  Filled 2016-11-29: qty 4000

## 2016-11-29 MED ORDER — CHLORHEXIDINE GLUCONATE 4 % EX LIQD
60.0000 mL | Freq: Once | CUTANEOUS | Status: AC
  Administered 2016-11-30: 4 via TOPICAL
  Filled 2016-11-29: qty 60

## 2016-11-29 MED ORDER — HEPARIN (PORCINE) IN NACL 100-0.45 UNIT/ML-% IJ SOLN
950.0000 [IU]/h | INTRAMUSCULAR | Status: AC
  Administered 2016-11-29: 950 [IU]/h via INTRAVENOUS
  Filled 2016-11-29: qty 250

## 2016-11-29 NOTE — ED Notes (Signed)
Patient returned from XRAY 

## 2016-11-29 NOTE — ED Notes (Addendum)
IV start unsuccessful x 2 by this RN

## 2016-11-29 NOTE — ED Notes (Signed)
Patient taken to X RAY.  Will do IV when patient returns.

## 2016-11-29 NOTE — ED Notes (Signed)
This nurse attempted IV access X2 unsuccessful.

## 2016-11-29 NOTE — ED Notes (Signed)
Patient is stable to be transported to floor at this time.  Report was called to 5N at this time.  A&Ox4.  Belongings taken to the floor with the patient.

## 2016-11-29 NOTE — ED Provider Notes (Addendum)
[facility]-EM[facility]Y DEPT Provider Note   CSN: [id number] Arrival date & time: 11/29/16  1820     History   Chief Complaint Chief Complaint  Patient presents with  . Fall    HPI Madison [name] is a 56 y.o. female.  Patient is a 56 year old female with a history significant for aortic stenosis, coronary artery disease, CHF, diabetes, stroke presenting today after a mechanical fall. She had just eaten dinner and someone spilled some water. She bent over to clean it up and fell backwards onto her left hip. She is having 10 out of 10 sharp severe pain in her left hip. She has been unable to walk or move her leg without severe pain. She denies any head injury, LOC or pain elsewhere.  No numbness or tingling of the left foot. Patient has had a stroke on the left side but is usually able to walk without assistance   The history is provided by the patient.    Past Medical History:  Diagnosis Date  . Allergy   . Anxiety   . Aortic stenosis   . CAD (coronary artery disease)   . CHF (congestive heart failure) (HCC)   . Depression   . Diabetes (HCC) dx'd 1982   "went straight to insulin"  . Dyslipidemia   . GERD (gastroesophageal reflux disease)   . Heart murmur   . HTN (hypertension)   . Hypercholesteremia   . Myocardial infarction 12/2014  . Obesity   . Pneumonia 11/2014; 12/2014  . Rheumatoid arthritis(714.0)    "hands" (07/01/2015)  . Stroke syndrome [facility]) 1995; 2001   "when my son was born; problems w/speech and L hand since then" (01/19/2015)  . Thrombophlebitis     Patient Active Problem List   Diagnosis Date Noted  . Type 2 diabetes mellitus with hyperosmolar nonketotic hyperglycemia (HCC) 10/21/2016  . Left ear pain 07/01/2016  . Hyperglycemia 06/06/2016  . Malnutrition of moderate degree 06/04/2016  . Septic shock (HCC) 06/04/2016  . SIRS (systemic inflammatory response syndrome) (HCC) 05/25/2016  . Type 1 diabetes mellitus with ketoacidosis without coma (HCC)   . MDD  (major depressive disorder), recurrent episode, moderate (HCC)   . Aspiration pneumonia (HCC) 05/17/2016  . Diabetic ketoacidosis without coma (HCC)   . Sepsis (HCC)   . 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 MSOF (multiple systems organ failure) 05/01/2016  . Acute hypoxemic respiratory failure (HCC)   . Type I diabetes mellitus with complication, uncontrolled (HCC) 03/29/2016  . Adjustment disorder with mixed anxiety and depressed mood 03/28/2016  . HLD (hyperlipidemia) 03/27/2016  . AKI (acute kidney injury) (HCC) 03/27/2016  . Chronic combined systolic and diastolic congestive heart failure (HCC)   . Diabetic ketoacidosis (HCC) 03/26/2016  . Type 1 diabetes, HbA1c goal < 7% (HCC)   . Hypotension 03/08/2016  . Uncontrolled type 1 diabetes mellitus with ketoacidotic coma (HCC)   . Acute encephalopathy 02/28/2016  . Hyponatremia: Pseudo 02/28/2016  . Lactic acidosis   . Diabetic ketoacidosis with coma associated with type 1 diabetes mellitus (HCC)   . Hypoglycemia   . Chronic diastolic heart failure (HCC)   . Esophageal reflux   . S/P AVR (aortic valve replacement)   . ARF (acute renal failure) (HCC) 01/22/2016  . CHF (congestive heart failure) (HCC) 01/22/2016  . Diabetic ketoacidosis without coma associated with type 1 diabetes mellitus (HCC)   . DKA, type 1 (HCC) 11/23/2015  . Constipation 11/23/2015  . Leukocytosis   . Pressure ulcer 08/02/2015  .  Diabetic ketoacidosis with coma associated with other specified diabetes mellitus (HCC)   . Closed left ankle fracture 07/01/2015  . Trimalleolar fracture of left ankle 07/01/2015  . Hyperkalemia   . Insomnia 06/16/2015  . Normocytic anemia 01/31/2015  . Acute on chronic combined systolic and diastolic CHF (congestive heart failure) (HCC)   . Essential hypertension   . Elevated troponin   . Diabetes type 1, uncontrolled (HCC)   . Demand ischemia (HCC)   . S/P aortic valve replacement   . Hypokalemia   . NSTEMI (non-ST  elevated myocardial infarction) (HCC) 12/08/2014  . Blood poisoning (HCC)   . Supratherapeutic INR   . Severe sepsis with acute organ dysfunction (HCC) 12/03/2014  . Spastic hemiplegia affecting nondominant side (HCC) 11/29/2014  . Dysphagia, pharyngoesophageal phase 10/04/2014  . Sinus tachycardia (HCC) 09/30/2014  . Depression 04/29/2014  . CVA (cerebral infarction) 03/12/2014  . Acute respiratory failure (HCC) 03/12/2014  . Chronic anticoagulation 07/28/2013  . Long term (current) use of anticoagulants 07/28/2013  . CAD (coronary artery disease)   . Aortic stenosis   . Hyperlipidemia   . Rheumatoid arthritis [facility])     Past Surgical History:  Procedure Laterality Date  . AORTIC VALVE REPLACEMENT (AVR)/CORONARY ARTERY BYPASS GRAFTING (CABG)  "2014"   [name] 03/08/2014  . CARDIAC CATHETERIZATION  12/08/2014  . CATARACT EXTRACTION Left   . CESAREAN SECTION  1995  . CORONARY ANGIOPLASTY WITH STENT PLACEMENT  12/09/2014  . CORONARY ARTERY BYPASS GRAFT    . FOOT FRACTURE SURGERY    . FRACTURE SURGERY    . KNEE ARTHROSCOPY Right   . LEFT HEART CATHETERIZATION WITH CORONARY ANGIOGRAM N/A 12/08/2014   Procedure: LEFT HEART CATHETERIZATION WITH CORONARY ANGIOGRAM;  Surgeon: Iran Ouch, MD;  Location: [facility] CATH LAB;  Service: Cardiovascular;  Laterality: N/A;  . NEUROPLASTY / TRANSPOSITION MEDIAN NERVE AT CARPAL TUNNEL    . ORIF ANKLE FRACTURE Left 07/04/2015   Procedure: OPEN REDUCTION INTERNAL FIXATION (ORIF)  TRIMAL ANKLE FRACTURE;  Surgeon: [name], MD;  Location: [facility] OR;  Service: Orthopedics;  Laterality: Left;  . PERCUTANEOUS CORONARY STENT INTERVENTION (PCI-S) N/A 12/09/2014   Procedure: PERCUTANEOUS CORONARY STENT INTERVENTION (PCI-S);  Surgeon: [name], MD;  Location: [facility] CATH LAB;  Service: Cardiovascular;  Laterality: N/A;  . TUBAL LIGATION  1995    OB History    No data available       Home Medications    Prior to Admission medications   Medication Sig  Start Date End Date Taking? Authorizing Provider  citalopram (CELEXA) 40 MG tablet Take 1 tablet (40 mg total) by mouth daily. 03/08/16   [name], NP  docusate sodium (COLACE) 100 MG capsule Take 100 mg by mouth 2 (two) times daily.    Historical Provider, MD  ergocalciferol (VITAMIN D2) 50000 units capsule Take 50,000 Units by mouth once a week. Pt takes on Wednesday.    Historical Provider, MD  insulin aspart (NOVOLOG) 100 UNIT/ML injection Inject 3 Units into the skin 3 (three) times daily with meals. 10/24/16   Omair Latif Sheikh, DO  insulin detemir (LEVEMIR) 100 UNIT/ML injection Inject 0.07 mLs (7 Units total) into the skin 2 (two) times daily. 10/24/16   Merlene Laughter, DO  Nutritional Supplements (NUTRITIONAL DRINK PO) Take 120 mLs by mouth 3 (three) times daily.     Historical Provider, MD  omeprazole (PRILOSEC) 40 MG capsule Take 40 mg by mouth daily before breakfast.    Historical Provider, MD  pravastatin (PRAVACHOL) 20 MG tablet Take 20 mg by mouth every evening.    Historical Provider, MD  traMADol (ULTRAM) 50 MG tablet Take 1 tablet (50 mg total) by mouth every 6 (six) hours as needed for moderate pain or severe pain. 10/24/16   Merlene Laughter, DO  warfarin (COUMADIN) 5 MG tablet Take 7.5 mg by mouth daily. Stop 10/21/16 per [facility]    Historical Provider, MD    Family History Family History  Problem Relation Age of Onset  . Heart disease Mother   . Heart disease Sister   . Stroke      Social History Social History  Substance Use Topics  . Smoking status: Never Smoker  . Smokeless tobacco: Never Used  . Alcohol use No     Allergies   Adhesive [tape]; Celebrex [celecoxib]; and Detrol [tolterodine]   Review of Systems Review of Systems  All other systems reviewed and are negative.    Physical Exam Updated Vital Signs BP 144/76 (BP Location: Left Arm)   Pulse 72   Temp 98.1 F (36.7 C) (Oral)   Resp 17   Ht 5\' 3"  (1.6 m)   Wt 146 lb (66.2 kg)   SpO2  99%   BMI 25.86 kg/m   Physical Exam  Constitutional: She is oriented to person, place, and time. She appears well-developed and well-nourished. No distress.  HENT:  Head: Normocephalic and atraumatic.  Mouth/Throat: Oropharynx is clear and moist.  Eyes: Conjunctivae and EOM are normal. Pupils are equal, round, and reactive to light.  Neck: Normal range of motion. Neck supple. No spinous process tenderness present.  Cardiovascular: Normal rate, regular rhythm and intact distal pulses.   No murmur heard. Pulmonary/Chest: Effort normal and breath sounds normal. No respiratory distress. She has no wheezes. She has no rales.  Abdominal: Soft. She exhibits no distension. There is no tenderness. There is no rebound and no guarding.  Musculoskeletal: Normal range of motion. She exhibits tenderness and deformity. She exhibits no edema.  Shortening and rotation of the left lower extremity. Significant pain when attempting to range. No knee, calf tenderness. Normal sensation in the left foot and 1+ DP pulse.  Neurological: She is alert and oriented to person, place, and time.  Contracture and weakness present in the left upper extremity. Mild slurred speech  Skin: Skin is warm and dry. No rash noted. No erythema.  Psychiatric: She has a normal mood and affect. Her behavior is normal.  Nursing note and vitals reviewed.    ED Treatments / Results  Labs (all labs ordered are listed, but only abnormal results are displayed) Labs Reviewed  BASIC METABOLIC PANEL - Abnormal; Notable for the following:       Result Value   Sodium 132 (*)    Chloride 96 (*)    Glucose, Bld 256 (*)    All other components within normal limits  CBC WITH DIFFERENTIAL/PLATELET - Abnormal; Notable for the following:    Hemoglobin 10.3 (*)    HCT 33.0 (*)    MCV 74.2 (*)    MCH 23.1 (*)    RDW 17.0 (*)    All other components within normal limits  PROTIME-INR - Abnormal; Notable for the following:    Prothrombin  Time 17.5 (*)    All other components within normal limits  TYPE AND SCREEN    EKG  EKG Interpretation  Date/Time:  Thursday November 29 2016 22:04:08 EST Ventricular Rate:  83 PR Interval:    QRS  Duration: 88 QT Interval:  408 QTC Calculation: 480 R Axis:   34 Text Interpretation:  Sinus rhythm No significant change since last tracing Confirmed by Anitra Lauth  MD, Melchor Kirchgessner ([id number]) on 11/29/2016 10:06:30 PM       Radiology Dg Chest 2 View  Result Date: 11/29/2016 CLINICAL DATA:  Fall from standing EXAM: CHEST  2 VIEW COMPARISON:  10/21/2016 FINDINGS: Post sternotomy changes. No acute infiltrate or effusion. Stable cardiomediastinal silhouette. No pneumothorax. IMPRESSION: No active cardiopulmonary disease. Electronically Signed   By: Jasmine Pang M.D.   On: 11/29/2016 19:46   Dg Hip Unilat With Pelvis 2-3 Views Left  Result Date: 11/29/2016 CLINICAL DATA:  Fall from standing.  Left hip pain. EXAM: DG HIP (WITH OR WITHOUT PELVIS) 2-3V LEFT COMPARISON:  None. FINDINGS: There is a comminuted intratrochanteric left proximal femoral fracture with a large butterfly fragment consisting of the lesser trochanter. The fracture is mildly impacted with no significant angulation. Soft tissue calcifications are seen. IMPRESSION: Comminuted impacted intertrochanteric left proximal femoral fracture. Electronically Signed   By: Ted Mcalpine M.D.   On: 11/29/2016 19:46    Procedures Procedures (including critical care time)  Medications Ordered in ED Medications  HYDROmorphone (DILAUDID) injection 1 mg (not administered)  0.9 %  sodium chloride infusion (not administered)     Initial Impression / Assessment and Plan / ED Course  I have reviewed the triage vital signs and the nursing notes.  Pertinent labs & imaging results that were available during my care of the patient were reviewed by me and considered in my medical decision making (see chart for details).  Clinical Course      Patient with a mechanical fall with deformity of her left hip. Patient denies any head injury or LOC. Patient does take Coumadin due to multiple medical problems. Patient has deformity of her left hip shortening and rotation. Severe pain in the left hip with concern for fracture. X-ray is consistent with a comminuted impacted intertrochanteric left proximal femur fracture. Spoke with orthopedics who want to do surgery in the morning if her INR is 1.8 or less. If it is between 2-2.5 she needs oral reversal.  Final Clinical Impressions(s) / ED Diagnoses   Final diagnoses:  Closed left hip fracture, initial encounter [facility])    New Prescriptions New Prescriptions   No medications on file     Gwyneth Sprout, MD 11/29/16 2132    Gwyneth Sprout, MD 11/29/16 2206

## 2016-11-29 NOTE — H&P (Signed)
History and Physical    Madison [name] TRR:[id number] DOB: 01-05-1961 DOA: 11/29/2016   PCP: [name], NP Chief Complaint:  Chief Complaint  Patient presents with  . Fall    HPI: Madison [name] is a 56 y.o. female with medical history significant of IDDM, mechanical AVR, stroke, CAD, diastolic grade 1 CHF on echo less than a year ago, no systolic dysfunction.  Patient presents to the ED today following a mechanical fall at her facility.  She had just eaten dinner, someone spilled water and she bent over to clean it up and fell backwards on to her left hip.  Severe left hip, sharp, pain after fall.  Unable to walk or move leg without pain.  ED Course: L hip intertroch fx.  Review of Systems: As per HPI otherwise 10 point review of systems negative.    Past Medical History:  Diagnosis Date  . Allergy   . Anxiety   . Aortic stenosis   . CAD (coronary artery disease)   . CHF (congestive heart failure) (HCC)   . Depression   . Diabetes (HCC) dx'd 1982   "went straight to insulin"  . Dyslipidemia   . GERD (gastroesophageal reflux disease)   . Heart murmur   . HTN (hypertension)   . Hypercholesteremia   . Myocardial infarction 12/2014  . Obesity   . Pneumonia 11/2014; 12/2014  . Rheumatoid arthritis(714.0)    "hands" (07/01/2015)  . Stroke syndrome [facility]) 1995; 2001   "when my son was born; problems w/speech and L hand since then" (01/19/2015)  . Thrombophlebitis     Past Surgical History:  Procedure Laterality Date  . AORTIC VALVE REPLACEMENT (AVR)/CORONARY ARTERY BYPASS GRAFTING (CABG)  "2014"   [name] 03/08/2014  . CARDIAC CATHETERIZATION  12/08/2014  . CATARACT EXTRACTION Left   . CESAREAN SECTION  1995  . CORONARY ANGIOPLASTY WITH STENT PLACEMENT  12/09/2014  . CORONARY ARTERY BYPASS GRAFT    . FOOT FRACTURE SURGERY    . FRACTURE SURGERY    . KNEE ARTHROSCOPY Right   . LEFT HEART CATHETERIZATION WITH CORONARY ANGIOGRAM N/A 12/08/2014   Procedure: LEFT HEART  CATHETERIZATION WITH CORONARY ANGIOGRAM;  Surgeon: Iran Ouch, MD;  Location: [facility] CATH LAB;  Service: Cardiovascular;  Laterality: N/A;  . NEUROPLASTY / TRANSPOSITION MEDIAN NERVE AT CARPAL TUNNEL    . ORIF ANKLE FRACTURE Left 07/04/2015   Procedure: OPEN REDUCTION INTERNAL FIXATION (ORIF)  TRIMAL ANKLE FRACTURE;  Surgeon: [name], MD;  Location: [facility] OR;  Service: Orthopedics;  Laterality: Left;  . PERCUTANEOUS CORONARY STENT INTERVENTION (PCI-S) N/A 12/09/2014   Procedure: PERCUTANEOUS CORONARY STENT INTERVENTION (PCI-S);  Surgeon: [name], MD;  Location: [facility] CATH LAB;  Service: Cardiovascular;  Laterality: N/A;  . TUBAL LIGATION  1995     reports that she has never smoked. She has never used smokeless tobacco. She reports that she does not drink alcohol or use drugs.  Allergies  Allergen Reactions  . Adhesive [Tape] Other (See Comments)    Reaction:  Burning   . Celebrex [Celecoxib] Rash  . Detrol [Tolterodine] Hives    Family History  Problem Relation Age of Onset  . Heart disease Mother   . Heart disease Sister   . Stroke        Prior to Admission medications   Medication Sig Start Date End Date Taking? Authorizing Provider  Acetaminophen (CHLORASEPTIC SORE THROAT PO) Take 1 spray by mouth every 2 (two) hours as needed (for sore throat).  Yes Historical Provider, MD  citalopram (CELEXA) 40 MG tablet Take 1 tablet (40 mg total) by mouth daily. 03/08/16  Yes [name], NP  docusate sodium (COLACE) 100 MG capsule Take 100 mg by mouth 2 (two) times daily.   Yes Historical Provider, MD  ergocalciferol (VITAMIN D2) 50000 units capsule Take 50,000 Units by mouth every Wednesday.    Yes Historical Provider, MD  guaiFENesin-dextromethorphan (ROBITUSSIN DM) 100-10 MG/5ML syrup Take 15 mLs by mouth every 4 (four) hours as needed for cough.   Yes Historical Provider, MD  insulin aspart (NOVOLOG) 100 UNIT/ML injection Inject 3 Units into the skin 3 (three) times daily with  meals. Patient taking differently: Inject 1-9 Units into the skin 3 (three) times daily with meals. Per sliding scale: BGL 101-150 = 1 unit; 151-200 = 2 units; 201-250 = 3 units; 251-300 = 5 units; 301-350 = 7 units; >350 = 9 units; Call MD for BGL >400 or <60 10/24/16  Yes Omair Latif Sheikh, DO  insulin detemir (LEVEMIR) 100 UNIT/ML injection Inject 0.07 mLs (7 Units total) into the skin 2 (two) times daily. Patient taking differently: Inject 7 Units into the skin 2 (two) times daily. TO BE INCREASED TO "10 units two times a day/0800 & 1700" 10/24/16  Yes Omair Latif Sheikh, DO  Nutritional Supplements (NUTRITIONAL DRINK PO) Take 60 mLs by mouth 3 (three) times daily. MEDPASS 1.7   Yes Historical Provider, MD  omeprazole (PRILOSEC) 40 MG capsule Take 40 mg by mouth daily before breakfast.   Yes Historical Provider, MD  pravastatin (PRAVACHOL) 20 MG tablet Take 20 mg by mouth every evening.   Yes Historical Provider, MD  traMADol (ULTRAM) 50 MG tablet Take 1 tablet (50 mg total) by mouth every 6 (six) hours as needed for moderate pain or severe pain. Patient taking differently: Take 50 mg by mouth every 6 (six) hours as needed (for pain).  10/24/16  Yes Omair Latif Sheikh, DO  zolpidem (AMBIEN) 5 MG tablet Take 5 mg by mouth at bedtime. FOR SLEEP    Historical Provider, MD    Physical Exam: Vitals:   11/29/16 2130 11/29/16 2145 11/29/16 2200 11/29/16 2215  BP: 132/66 128/69 (!) 123/105 138/61  Pulse: 82 80 84 82  Resp:    20  Temp:      TempSrc:      SpO2: 94% 94% 95% 96%  Weight:      Height:          Constitutional: NAD, calm, comfortable Eyes: PERRL, lids and conjunctivae normal ENMT: Mucous membranes are moist. Posterior pharynx clear of any exudate or lesions.Normal dentition.  Neck: normal, supple, no masses, no thyromegaly Respiratory: clear to auscultation bilaterally, no wheezing, no crackles. Normal respiratory effort. No accessory muscle use.  Cardiovascular: Regular rate and  rhythm, Mechanical valve No extremity edema. 2+ pedal pulses. No carotid bruits.  Abdomen: no tenderness, no masses palpated. No hepatosplenomegaly. Bowel sounds positive.  Musculoskeletal: L hip pain Skin: no rashes, lesions, ulcers. No induration Neurologic: CN 2-12 grossly intact. Sensation intact, DTR normal. Strength 5/5 in all 4.  Psychiatric: Normal judgment and insight. Alert and oriented x 3. Normal mood.    Labs on Admission: I have personally reviewed following labs and imaging studies  CBC:  Recent Labs Lab 11/29/16 2015  WBC 8.5  NEUTROABS 6.1  HGB 10.3*  HCT 33.0*  MCV 74.2*  PLT 338   Basic Metabolic Panel:  Recent Labs Lab 11/29/16 2015  NA 132*  K  4.3  CL 96*  CO2 25  GLUCOSE 256*  BUN 18  CREATININE 0.90  CALCIUM 9.2   GFR: Estimated Creatinine Clearance: 64.6 mL/min (by C-G formula based on SCr of 0.9 mg/dL). Liver Function Tests: No results for input(s): AST, ALT, ALKPHOS, BILITOT, PROT, ALBUMIN in the last 168 hours. No results for input(s): LIPASE, AMYLASE in the last 168 hours. No results for input(s): AMMONIA in the last 168 hours. Coagulation Profile:  Recent Labs Lab 11/29/16 2015  INR 1.43   Cardiac Enzymes: No results for input(s): CKTOTAL, CKMB, CKMBINDEX, TROPONINI in the last 168 hours. BNP (last 3 results) No results for input(s): PROBNP in the last 8760 hours. HbA1C: No results for input(s): HGBA1C in the last 72 hours. CBG: No results for input(s): GLUCAP in the last 168 hours. Lipid Profile: No results for input(s): CHOL, HDL, LDLCALC, TRIG, CHOLHDL, LDLDIRECT in the last 72 hours. Thyroid Function Tests: No results for input(s): TSH, T4TOTAL, FREET4, T3FREE, THYROIDAB in the last 72 hours. Anemia Panel: No results for input(s): VITAMINB12, FOLATE, FERRITIN, TIBC, IRON, RETICCTPCT in the last 72 hours. Urine analysis:    Component Value Date/Time   COLORURINE YELLOW 10/21/2016 1816   APPEARANCEUR CLEAR 10/21/2016  1816   LABSPEC 1.024 10/21/2016 1816   PHURINE 6.0 10/21/2016 1816   GLUCOSEU >1000 (A) 10/21/2016 1816   HGBUR NEGATIVE 10/21/2016 1816   BILIRUBINUR NEGATIVE 10/21/2016 1816   BILIRUBINUR n 06/16/2015 1244   KETONESUR NEGATIVE 10/21/2016 1816   PROTEINUR NEGATIVE 10/21/2016 1816   UROBILINOGEN 0.2 07/30/2015 1415   NITRITE NEGATIVE 10/21/2016 1816   LEUKOCYTESUR SMALL (A) 10/21/2016 1816   Sepsis Labs: @LABRCNTIP (procalcitonin:4,lacticidven:4) )No results found for this or any previous visit (from the past 240 hour(s)).   Radiological Exams on Admission: Dg Chest 2 View  Result Date: 11/29/2016 CLINICAL DATA:  Fall from standing EXAM: CHEST  2 VIEW COMPARISON:  10/21/2016 FINDINGS: Post sternotomy changes. No acute infiltrate or effusion. Stable cardiomediastinal silhouette. No pneumothorax. IMPRESSION: No active cardiopulmonary disease. Electronically Signed   By: 14/01/2016 M.D.   On: 11/29/2016 19:46   Dg Knee Left Port  Result Date: 11/29/2016 CLINICAL DATA:  Left hip fracture. EXAM: PORTABLE LEFT KNEE - 1-2 VIEW COMPARISON:  None. FINDINGS: No evidence of fracture, dislocation, or joint effusion. No evidence of arthropathy or other focal bone abnormality. Vascular calcifications noted. Soft tissues are otherwise unremarkable. IMPRESSION: No acute fracture or dislocation identified about the left knee. Electronically Signed   By: 01/27/2017 M.D.   On: 11/29/2016 21:10   Dg Hip Unilat With Pelvis 2-3 Views Left  Result Date: 11/29/2016 CLINICAL DATA:  Fall from standing.  Left hip pain. EXAM: DG HIP (WITH OR WITHOUT PELVIS) 2-3V LEFT COMPARISON:  None. FINDINGS: There is a comminuted intratrochanteric left proximal femoral fracture with a large butterfly fragment consisting of the lesser trochanter. The fracture is mildly impacted with no significant angulation. Soft tissue calcifications are seen. IMPRESSION: Comminuted impacted intertrochanteric left proximal femoral  fracture. Electronically Signed   By: 01/27/2017 M.D.   On: 11/29/2016 19:46    EKG: Independently reviewed.  Assessment/Plan Principal Problem:   Closed displaced intertrochanteric fracture of left femur (HCC) Active Problems:   Diabetes type 1, uncontrolled (HCC)   S/P AVR (aortic valve replacement)   Difficult airway for intubation    1. L hip fx - 1. Surgery planned for 0930 tomorrow AM 2. Hip fx pathway 3. NPO after midnight 4. Of note patient requests to  go back to her current SNF / Rehab facility after repair. 2. DM1 - 1. Levemir 7 units BID ordered, looks like she takes 10 units BID at the SNF 2. SSI sensitive scale Q4H ordered 3. S/p AVR - has h/o stroke in past 1. INR subtheraputic at 1.4 2. Will put on heparin gtt to bridge patient for the moment before and after surgery due to stroke risk 3. Heparin gtt, currently plan to hold this 1 hr before surgery and resume 12 hours after surgery per my discussion with Dr. Aundria Rud 4. H/o Difficult airway - difficult airway flag is noted in patient chart, anesthesia should assess prior to surgery.   DVT prophylaxis: Heparin gtt Code Status: Full Family Communication: No family in room Consults called: Ortho, see Dr. Aundria Rud note in chart Admission status: Admit to inpatient   Hillary Bow DO [facility] Pager [phone] from 7PM-7AM  If 7AM-7PM, please contact the day physician for the patient [email or web address] Password [email or web address]  11/29/2016, 10:30 PM

## 2016-11-29 NOTE — Progress Notes (Signed)
ANTICOAGULATION CONSULT NOTE - Initial Consult  Pharmacy Consult for heparin Indication: mechanical AVR  Allergies  Allergen Reactions  . Adhesive [Tape] Other (See Comments)    Reaction:  Burning   . Celebrex [Celecoxib] Rash  . Detrol [Tolterodine] Hives    Patient Measurements: Height: 5\' 3"  (160 cm) Weight: 146 lb (66.2 kg) IBW/kg (Calculated) : 52.4 Heparin Dosing Weight: 66 kg  Vital Signs: Temp: 98.1 F (36.7 C) (01/11 1823) Temp Source: Oral (01/11 1823) BP: 145/76 (01/11 1830) Pulse Rate: 70 (01/11 1830)  Labs:  Recent Labs  11/29/16 2015  HGB 10.3*  HCT 33.0*  PLT 338  LABPROT 17.5*  INR 1.43  CREATININE 0.90    Medical History: Past Medical History:  Diagnosis Date  . Allergy   . Anxiety   . Aortic stenosis   . CAD (coronary artery disease)   . CHF (congestive heart failure) (HCC)   . Depression   . Diabetes (HCC) dx'd 1982   "went straight to insulin"  . Dyslipidemia   . GERD (gastroesophageal reflux disease)   . Heart murmur   . HTN (hypertension)   . Hypercholesteremia   . Myocardial infarction 12/2014  . Obesity   . Pneumonia 11/2014; 12/2014  . Rheumatoid arthritis(714.0)    "hands" (07/01/2015)  . Stroke syndrome [facility]) 1995; 2001   "when my son was born; problems w/speech and L hand since then" (01/19/2015)  . Thrombophlebitis      Assessment: 56 yo female admitted from facility due to mechanical fall. Xray showing L hip fracture. Pt is on warfarin prior to admission for hx of aortic valve replacement, reportedly, mechanical valve. Last outpatient anticoagulation note showing goal INR of 2.5-3.5.  Pt has been receiving warfarin at facility, though no consistent dose; unable to verify INR during this time. Despite her receiving warfarin, oddly enough her INR is 1.43 today. At her last anti-coag visit in our system, last October, she was taking warfarin 7.5 mg po daily. Will bridge pt with heparin perioperatively. She is going to the OR  tomorrow for repair of her L hip fracture. MD requests heparin to be turned off 1 hour prior to procedure.  Jan 3: 7.5 mg Jan 4: 7.5 mg Jan 5: no warfarin Jan 6: 5 mg  Jan 7: 5 mg Jan 8: 6 mg Jan 9: 6 mg Jan 10: 10 mg  Goal of Therapy:  INR 2.5-3.5 Monitor platelets by anticoagulation protocol: Yes    Plan:  -Heparin bolus 4000 units x1 then 950 units/hr -Daily HL, CBC -Heparin to end at 0830 tomorrow prior to surgery -F/u resumption of heparin after OR tomorrow    Doni Widmer, Jan 12 11/29/2016,9:55 PM

## 2016-11-29 NOTE — ED Triage Notes (Signed)
Pt BIB [facility]EMS from AL at [facility] where pt was witnessed to have a fall. Pt fell on left hip. No LOC per facility/EMS. Pt denies pain in back and head stating only pain from left hip down. A&OX4. Some left sided deficits from previous stroke.

## 2016-11-29 NOTE — ED Notes (Signed)
IV team at bedside 

## 2016-11-29 NOTE — Progress Notes (Signed)
Will tentatively plan for Operative management of left hip fracture tomorrow am, unless otherwise not cleared.  INR is ok for surgery at 1.43.  Please make NPO at [place].

## 2016-11-30 ENCOUNTER — Inpatient Hospital Stay (HOSPITAL_COMMUNITY): Payer: Medicare HMO

## 2016-11-30 ENCOUNTER — Encounter (HOSPITAL_COMMUNITY): Payer: Self-pay | Admitting: Anesthesiology

## 2016-11-30 ENCOUNTER — Inpatient Hospital Stay (HOSPITAL_COMMUNITY): Payer: Medicare HMO | Admitting: Anesthesiology

## 2016-11-30 ENCOUNTER — Encounter (HOSPITAL_COMMUNITY)
Admission: EM | Disposition: A | Discharge status: Skilled Nursing Facility | Payer: Self-pay | Source: Home / Self Care | Attending: Internal Medicine

## 2016-11-30 DIAGNOSIS — Z952 Presence of prosthetic heart valve: Secondary | ICD-10-CM

## 2016-11-30 DIAGNOSIS — S72142D Displaced intertrochanteric fracture of left femur, subsequent encounter for closed fracture with routine healing: Secondary | ICD-10-CM

## 2016-11-30 HISTORY — PX: INTRAMEDULLARY (IM) NAIL INTERTROCHANTERIC: SHX5875

## 2016-11-30 LAB — URINALYSIS, ROUTINE W REFLEX MICROSCOPIC
Bilirubin Urine: NEGATIVE
Glucose, UA: 150 mg/dL — AB
Hgb urine dipstick: NEGATIVE
Ketones, ur: 20 mg/dL — AB
Nitrite: NEGATIVE
Protein, ur: NEGATIVE mg/dL
Specific Gravity, Urine: 1.019 (ref 1.005–1.030)
pH: 5 (ref 5.0–8.0)

## 2016-11-30 LAB — GLUCOSE, CAPILLARY
GLUCOSE-CAPILLARY: 223 mg/dL — AB (ref 65–99)
Glucose-Capillary: 206 mg/dL — ABNORMAL HIGH (ref 65–99)
Glucose-Capillary: 177 mg/dL — ABNORMAL HIGH (ref 65–99)
Glucose-Capillary: 197 mg/dL — ABNORMAL HIGH (ref 65–99)
Glucose-Capillary: 370 mg/dL — ABNORMAL HIGH (ref 65–99)

## 2016-11-30 LAB — SURGICAL PCR SCREEN
MRSA, PCR: NEGATIVE
Staphylococcus aureus: NEGATIVE

## 2016-11-30 LAB — HEPARIN LEVEL (UNFRACTIONATED): HEPARIN UNFRACTIONATED: 0.81 [IU]/mL — AB (ref 0.30–0.70)

## 2016-11-30 SURGERY — FIXATION, FRACTURE, INTERTROCHANTERIC, WITH INTRAMEDULLARY ROD
Anesthesia: General | Site: Hip | Laterality: Left

## 2016-11-30 MED ORDER — METOCLOPRAMIDE HCL 5 MG PO TABS: 5.0000 mg | ORAL_TABLET | Freq: Three times a day (TID) | ORAL | Status: DC | PRN

## 2016-11-30 MED ORDER — FENTANYL CITRATE (PF) 100 MCG/2ML IJ SOLN
INTRAMUSCULAR | Status: DC | PRN
  Administered 2016-11-30 (×2): 50 ug via INTRAVENOUS

## 2016-11-30 MED ORDER — FENTANYL CITRATE (PF) 100 MCG/2ML IJ SOLN
INTRAMUSCULAR | Status: AC
  Filled 2016-11-30: qty 2

## 2016-11-30 MED ORDER — MIDAZOLAM HCL 2 MG/2ML IJ SOLN
INTRAMUSCULAR | Status: AC
  Filled 2016-11-30: qty 2

## 2016-11-30 MED ORDER — WARFARIN SODIUM 5 MG PO TABS
10.0000 mg | ORAL_TABLET | Freq: Once | ORAL | Status: AC
  Administered 2016-11-30: 10 mg via ORAL
  Filled 2016-11-30: qty 2

## 2016-11-30 MED ORDER — SUGAMMADEX SODIUM 200 MG/2ML IV SOLN
INTRAVENOUS | Status: DC | PRN
  Administered 2016-11-30: 200 mg via INTRAVENOUS

## 2016-11-30 MED ORDER — CEFAZOLIN SODIUM-DEXTROSE 2-4 GM/100ML-% IV SOLN
2.0000 g | Freq: Four times a day (QID) | INTRAVENOUS | Status: AC
  Administered 2016-11-30 – 2016-12-01 (×3): 2 g via INTRAVENOUS
  Filled 2016-11-30 (×3): qty 100

## 2016-11-30 MED ORDER — PROPOFOL 10 MG/ML IV BOLUS
INTRAVENOUS | Status: DC | PRN
  Administered 2016-11-30: 130 mg via INTRAVENOUS

## 2016-11-30 MED ORDER — ROCURONIUM BROMIDE 100 MG/10ML IV SOLN
INTRAVENOUS | Status: DC | PRN
  Administered 2016-11-30: 50 mg via INTRAVENOUS

## 2016-11-30 MED ORDER — HEPARIN (PORCINE) IN NACL 100-0.45 UNIT/ML-% IJ SOLN
950.0000 [IU]/h | INTRAMUSCULAR | Status: DC
  Administered 2016-12-01: 850 [IU]/h via INTRAVENOUS
  Administered 2016-12-02: 950 [IU]/h via INTRAVENOUS
  Filled 2016-11-30 (×3): qty 250

## 2016-11-30 MED ORDER — ROCURONIUM BROMIDE 50 MG/5ML IV SOSY
PREFILLED_SYRINGE | INTRAVENOUS | Status: AC
  Filled 2016-11-30: qty 5

## 2016-11-30 MED ORDER — PHENYLEPHRINE HCL 10 MG/ML IJ SOLN
INTRAVENOUS | Status: DC | PRN
  Administered 2016-11-30: 50 ug/min via INTRAVENOUS

## 2016-11-30 MED ORDER — ACETAMINOPHEN 650 MG RE SUPP: 650.0000 mg | Freq: Four times a day (QID) | RECTAL | Status: DC | PRN

## 2016-11-30 MED ORDER — LACTATED RINGERS IV SOLN
INTRAVENOUS | Status: DC
  Administered 2016-11-30 (×2): via INTRAVENOUS

## 2016-11-30 MED ORDER — ONDANSETRON HCL 4 MG/2ML IJ SOLN: 4.0000 mg | Freq: Four times a day (QID) | INTRAMUSCULAR | Status: DC | PRN

## 2016-11-30 MED ORDER — METOCLOPRAMIDE HCL 5 MG/ML IJ SOLN: 5.0000 mg | Freq: Three times a day (TID) | INTRAMUSCULAR | Status: DC | PRN

## 2016-11-30 MED ORDER — PHENYLEPHRINE HCL 10 MG/ML IJ SOLN
INTRAMUSCULAR | Status: DC | PRN
  Administered 2016-11-30: 80 ug via INTRAVENOUS
  Administered 2016-11-30: 160 ug via INTRAVENOUS

## 2016-11-30 MED ORDER — ONDANSETRON HCL 4 MG/2ML IJ SOLN
INTRAMUSCULAR | Status: DC | PRN
  Administered 2016-11-30: 4 mg via INTRAVENOUS

## 2016-11-30 MED ORDER — WARFARIN - PHARMACIST DOSING INPATIENT
Freq: Every day | Status: DC
  Administered 2016-12-01 – 2016-12-02 (×2)

## 2016-11-30 MED ORDER — INSULIN ASPART 100 UNIT/ML ~~LOC~~ SOLN
SUBCUTANEOUS | Status: AC
  Filled 2016-11-30: qty 1

## 2016-11-30 MED ORDER — FENTANYL CITRATE (PF) 100 MCG/2ML IJ SOLN
25.0000 ug | INTRAMUSCULAR | Status: DC | PRN
  Administered 2016-11-30 (×2): 50 ug via INTRAVENOUS

## 2016-11-30 MED ORDER — ONDANSETRON HCL 4 MG PO TABS: 4.0000 mg | ORAL_TABLET | Freq: Four times a day (QID) | ORAL | Status: DC | PRN

## 2016-11-30 MED ORDER — GLUCERNA SHAKE PO LIQD
237.0000 mL | Freq: Two times a day (BID) | ORAL | Status: DC
  Administered 2016-11-30 – 2016-12-03 (×6): 237 mL via ORAL

## 2016-11-30 MED ORDER — LIDOCAINE 2% (20 MG/ML) 5 ML SYRINGE
INTRAMUSCULAR | Status: AC
  Filled 2016-11-30: qty 5

## 2016-11-30 MED ORDER — MIDAZOLAM HCL 5 MG/5ML IJ SOLN
INTRAMUSCULAR | Status: DC | PRN
  Administered 2016-11-30 (×2): 1 mg via INTRAVENOUS

## 2016-11-30 MED ORDER — ACETAMINOPHEN 325 MG PO TABS
650.0000 mg | ORAL_TABLET | Freq: Four times a day (QID) | ORAL | Status: DC | PRN
  Administered 2016-12-01 (×2): 650 mg via ORAL
  Filled 2016-11-30 (×3): qty 2

## 2016-11-30 MED ORDER — DIPHENHYDRAMINE HCL 25 MG PO CAPS
25.0000 mg | ORAL_CAPSULE | Freq: Three times a day (TID) | ORAL | Status: DC | PRN
  Administered 2016-11-30 – 2016-12-02 (×3): 25 mg via ORAL
  Filled 2016-11-30 (×3): qty 1

## 2016-11-30 MED ORDER — 0.9 % SODIUM CHLORIDE (POUR BTL) OPTIME
TOPICAL | Status: DC | PRN
  Administered 2016-11-30: 1000 mL

## 2016-11-30 SURGICAL SUPPLY — 39 items
BIT DRILL FLUTED FEMUR 4.2/3 (BIT) ×3 IMPLANT
BLADE SURG 15 STRL LF DISP TIS (BLADE) ×1 IMPLANT
BLADE SURG 15 STRL SS (BLADE) ×2
BNDG COHESIVE 6X5 TAN STRL LF (GAUZE/BANDAGES/DRESSINGS) ×3 IMPLANT
COVER PERINEAL POST (MISCELLANEOUS) ×3 IMPLANT
COVER SURGICAL LIGHT HANDLE (MISCELLANEOUS) ×3 IMPLANT
DRAPE INCISE IOBAN 66X45 STRL (DRAPES) ×3 IMPLANT
DRAPE STERI IOBAN 125X83 (DRAPES) ×3 IMPLANT
DRSG PAD ABDOMINAL 8X10 ST (GAUZE/BANDAGES/DRESSINGS) ×6 IMPLANT
DURAPREP 26ML APPLICATOR (WOUND CARE) ×3 IMPLANT
ELECT CAUTERY BLADE 6.4 (BLADE) ×3 IMPLANT
ELECT REM PT RETURN 9FT ADLT (ELECTROSURGICAL) ×3
ELECTRODE REM PT RTRN 9FT ADLT (ELECTROSURGICAL) ×1 IMPLANT
GAUZE SPONGE 4X4 12PLY STRL (GAUZE/BANDAGES/DRESSINGS) ×3 IMPLANT
GAUZE XEROFORM 5X9 LF (GAUZE/BANDAGES/DRESSINGS) ×3 IMPLANT
GLOVE BIO SURGEON STRL SZ7.5 (GLOVE) ×3 IMPLANT
GLOVE BIOGEL PI IND STRL 8 (GLOVE) ×1 IMPLANT
GLOVE BIOGEL PI INDICATOR 8 (GLOVE) ×2
GOWN STRL REUS W/ TWL LRG LVL3 (GOWN DISPOSABLE) ×2 IMPLANT
GOWN STRL REUS W/ TWL XL LVL3 (GOWN DISPOSABLE) ×1 IMPLANT
GOWN STRL REUS W/TWL LRG LVL3 (GOWN DISPOSABLE) ×4
GOWN STRL REUS W/TWL XL LVL3 (GOWN DISPOSABLE) ×2
GUIDEWIRE 3.2X400 (WIRE) ×6 IMPLANT
KIT BASIN OR (CUSTOM PROCEDURE TRAY) ×3 IMPLANT
KIT ROOM TURNOVER OR (KITS) ×3 IMPLANT
NAIL TROCH FIX 10X235 LT 130 (Nail) ×3 IMPLANT
NS IRRIG 1000ML POUR BTL (IV SOLUTION) ×3 IMPLANT
PACK GENERAL/GYN (CUSTOM PROCEDURE TRAY) ×3 IMPLANT
PAD ARMBOARD 7.5X6 YLW CONV (MISCELLANEOUS) ×6 IMPLANT
REAMER ROD DEEP FLUTE 2.5X950 (INSTRUMENTS) ×3 IMPLANT
SCREW LOCK T25 FT 36X5X4.3X (Screw) ×1 IMPLANT
SCREW LOCKING 5.0X36MM (Screw) ×2 IMPLANT
SCREW TFNA 90MM HIP (Orthopedic Implant) ×3 IMPLANT
STAPLER VISISTAT 35W (STAPLE) ×3 IMPLANT
SUT MON AB 2-0 CT1 36 (SUTURE) ×3 IMPLANT
SUT VIC AB 2-0 CT1 27 (SUTURE) ×2
SUT VIC AB 2-0 CT1 TAPERPNT 27 (SUTURE) ×1 IMPLANT
TOWEL OR 17X24 6PK STRL BLUE (TOWEL DISPOSABLE) ×3 IMPLANT
TOWEL OR 17X26 10 PK STRL BLUE (TOWEL DISPOSABLE) ×3 IMPLANT

## 2016-11-30 NOTE — Discharge Instructions (Signed)
-  ok for full weight bearing as tolerated to left leg -maintain dressing until post op follow up if not saturated, in which case change them out for clean dry dressing -ok to begin showering on post op day 2 -continue warfarin with bridge for dvt prevention

## 2016-11-30 NOTE — Progress Notes (Signed)
PROGRESS NOTE    Madison [name]  PZW:[id number] DOB: 26-Feb-1961 DOA: 11/29/2016 PCP: [name], NP  Brief Narrative: Madison [name] is a 56 y.o. female with medical history significant of IDDM, mechanical AVR, stroke, CAD, diastolic grade 1 CHF on echo less than a year ago, no systolic dysfunction.  Patient presented to the ED  following a mechanical fall at her facility, Xray noted  hip intertroch fx. Ortho consulting  Assessment & Plan:   Principal Problem:  Closed displaced intertrochanteric fracture of left femur (HCC) -plan for OR today, heparin held, resume at midnight per Ortho -Pt to FU    Diabetes type 1, uncontrolled (HCC) -resumed levemir at 7units BID on 10BID at SNF -h/o very brittle DM   S/P AVR (aortic valve replacement) -on warfarin at SNF, INR was 1.48, on heparin bridge currently held for OR -resume Heparin at midnight   S/p CVA -anticoagulation as above  Mild Cognitive dysfunction -now resides at SNF  Anxiety/Depression -continue celexa  CAD status post CABG  - denies any chest pain  DVT prophylaxis: Heparin Code Status:Full Code Family Communication:None at bedside Disposition Plan: Back to SNF in 2-3days   Consultants:   Ortho Dr.Rogers   Subjective: Feels ok, except for L hip pain  Objective: Vitals:   11/29/16 2215 11/29/16 2317 11/30/16 0323 11/30/16 0858  BP: 138/61 (!) 151/77 122/74 (!) 163/75  Pulse: 82 90 81 92  Resp: 20  16 16   Temp:  98.4 F (36.9 C) 98.3 F (36.8 C) 98.5 F (36.9 C)  TempSrc:  Oral Oral   SpO2: 96% 97% 97% 95%  Weight:      Height:        Intake/Output Summary (Last 24 hours) at 11/30/16 1047 Last data filed at 11/30/16 0650  Gross per 24 hour  Intake           619.33 ml  Output              500 ml  Net           119.33 ml   Filed Weights   11/29/16 1824  Weight: 66.2 kg (146 lb)    Examination:  General exam: Appears calm and comfortable, AAOx3, no distress Respiratory system: Clear to  auscultation. Respiratory effort normal. Cardiovascular system: S1 & S2 heard, RRR. metallic click noted, . No pedal edema. Gastrointestinal system: Abdomen is nondistended, soft and nontender.  Normal bowel sounds heard. Central nervous system: Alert and oriented. No focal neurological deficits. Extremities: LUE chronically weak, LLE shortened Skin: No rashes, lesions or ulcers Psychiatry: Judgement and insight appear normal. Mood & affect appropriate.     Data Reviewed: I have personally reviewed following labs and imaging studies  CBC:  Recent Labs Lab 11/29/16 2015  WBC 8.5  NEUTROABS 6.1  HGB 10.3*  HCT 33.0*  MCV 74.2*  PLT 338   Basic Metabolic Panel:  Recent Labs Lab 11/29/16 2015  NA 132*  K 4.3  CL 96*  CO2 25  GLUCOSE 256*  BUN 18  CREATININE 0.90  CALCIUM 9.2   GFR: Estimated Creatinine Clearance: 64.6 mL/min (by C-G formula based on SCr of 0.9 mg/dL). Liver Function Tests: No results for input(s): AST, ALT, ALKPHOS, BILITOT, PROT, ALBUMIN in the last 168 hours. No results for input(s): LIPASE, AMYLASE in the last 168 hours. No results for input(s): AMMONIA in the last 168 hours. Coagulation Profile:  Recent Labs Lab 11/29/16 2015  INR 1.43   Cardiac Enzymes: No  results for input(s): CKTOTAL, CKMB, CKMBINDEX, TROPONINI in the last 168 hours. BNP (last 3 results) No results for input(s): PROBNP in the last 8760 hours. HbA1C: No results for input(s): HGBA1C in the last 72 hours. CBG:  Recent Labs Lab 11/29/16 2324 11/30/16 0320 11/30/16 0812  GLUCAP 315* 206* 177*   Lipid Profile: No results for input(s): CHOL, HDL, LDLCALC, TRIG, CHOLHDL, LDLDIRECT in the last 72 hours. Thyroid Function Tests: No results for input(s): TSH, T4TOTAL, FREET4, T3FREE, THYROIDAB in the last 72 hours. Anemia Panel: No results for input(s): VITAMINB12, FOLATE, FERRITIN, TIBC, IRON, RETICCTPCT in the last 72 hours. Urine analysis:    Component Value  Date/Time   COLORURINE YELLOW 11/29/2016 2327   APPEARANCEUR HAZY (A) 11/29/2016 2327   LABSPEC 1.019 11/29/2016 2327   PHURINE 5.0 11/29/2016 2327   GLUCOSEU 150 (A) 11/29/2016 2327   HGBUR NEGATIVE 11/29/2016 2327   BILIRUBINUR NEGATIVE 11/29/2016 2327   BILIRUBINUR n 06/16/2015 1244   KETONESUR 20 (A) 11/29/2016 2327   PROTEINUR NEGATIVE 11/29/2016 2327   UROBILINOGEN 0.2 07/30/2015 1415   NITRITE NEGATIVE 11/29/2016 2327   LEUKOCYTESUR MODERATE (A) 11/29/2016 2327   Sepsis Labs: @LABRCNTIP (procalcitonin:4,lacticidven:4)  ) Recent Results (from the past 240 hour(s))  Surgical pcr screen     Status: None   Collection Time: 11/29/16 11:21 PM  Result Value Ref Range Status   MRSA, PCR NEGATIVE NEGATIVE Final   Staphylococcus aureus NEGATIVE NEGATIVE Final    Comment:        The Xpert SA Assay (FDA approved for NASAL specimens in patients over 16 years of age), is one component of a comprehensive surveillance program.  Test performance has been validated by [facility] for patients greater than or equal to 62 year old. It is not intended to diagnose infection nor to guide or monitor treatment.          Radiology Studies: Dg Chest 2 View  Result Date: 11/29/2016 CLINICAL DATA:  Fall from standing EXAM: CHEST  2 VIEW COMPARISON:  10/21/2016 FINDINGS: Post sternotomy changes. No acute infiltrate or effusion. Stable cardiomediastinal silhouette. No pneumothorax. IMPRESSION: No active cardiopulmonary disease. Electronically Signed   By: 14/01/2016 M.D.   On: 11/29/2016 19:46   Dg Knee Left Port  Result Date: 11/29/2016 CLINICAL DATA:  Left hip fracture. EXAM: PORTABLE LEFT KNEE - 1-2 VIEW COMPARISON:  None. FINDINGS: No evidence of fracture, dislocation, or joint effusion. No evidence of arthropathy or other focal bone abnormality. Vascular calcifications noted. Soft tissues are otherwise unremarkable. IMPRESSION: No acute fracture or dislocation identified about the  left knee. Electronically Signed   By: 01/27/2017 M.D.   On: 11/29/2016 21:10   Dg Hip Unilat With Pelvis 2-3 Views Left  Result Date: 11/29/2016 CLINICAL DATA:  Fall from standing.  Left hip pain. EXAM: DG HIP (WITH OR WITHOUT PELVIS) 2-3V LEFT COMPARISON:  None. FINDINGS: There is a comminuted intratrochanteric left proximal femoral fracture with a large butterfly fragment consisting of the lesser trochanter. The fracture is mildly impacted with no significant angulation. Soft tissue calcifications are seen. IMPRESSION: Comminuted impacted intertrochanteric left proximal femoral fracture. Electronically Signed   By: 01/27/2017 M.D.   On: 11/29/2016 19:46        Scheduled Meds: .  ceFAZolin (ANCEF) IV  2 g Intravenous On Call to OR  . [MAR Hold] citalopram  40 mg Oral Daily  . [MAR Hold] docusate sodium  100 mg Oral BID  . [MAR Hold] insulin aspart  0-9 Units Subcutaneous Q4H  . [MAR Hold] insulin detemir  7 Units Subcutaneous BID  . [MAR Hold] pantoprazole  80 mg Oral Daily  . povidone-iodine  2 application Topical Once  . [MAR Hold] pravastatin  20 mg Oral QPM   Continuous Infusions: . sodium chloride 75 mL/hr at 11/29/16 2329  . lactated ringers       LOS: 1 day    Time spent:    Zannie Cove, MD [facility] Pager [phone]  If 7PM-7AM, please contact night-coverage [email or web address] Password [facility] 11/30/2016, 10:47 AM

## 2016-11-30 NOTE — Progress Notes (Signed)
Initial Nutrition Assessment  DOCUMENTATION CODES:   Not applicable  INTERVENTION:  Provide Glucerna Shake po BID, each supplement provides 220 kcal and 10 grams of protein.  Encourage adequate PO intake.   NUTRITION DIAGNOSIS:   Increased nutrient needs related to  (post op healing) as evidenced by estimated needs.  GOAL:   Patient will meet greater than or equal to 90% of their needs  MONITOR:   PO intake, Supplement acceptance, Labs, Weight trends, Skin, I & O's  REASON FOR ASSESSMENT:   Consult Hip fracture protocol  ASSESSMENT:   56 y.o. female with medical history significant of IDDM, mechanical AVR, stroke, CAD, diastolic grade 1 CHF on echo less than a year ago, no systolic dysfunction.  Patient presented to the ED  following a mechanical fall at her facility, Xray noted  hip intertroch fx.  Pt was unavailable in OR during attempted time of visit. RD unable to obtain nutrition history. Pt with no weight loss per weight records. Diet has just been advanced to a regular diet. RD to order nutritional supplements to aid in post op healing. Unable to complete Nutrition-Focused physical exam at this time. RD to perform physical exam at next visit.   Labs and medications reviewed. CBGs 177-315 mg/dL.  Diet Order:  Diet regular Room service appropriate? Yes; Fluid consistency: Thin  Skin:   (Incision on L hip)  Last BM:  Unknown  Height:   Ht Readings from Last 1 Encounters:  11/29/16 5\' 3"  (1.6 m)    Weight:   Wt Readings from Last 1 Encounters:  11/29/16 146 lb (66.2 kg)    Ideal Body Weight:  52.27 kg  BMI:  Body mass index is 25.86 kg/m.  Estimated Nutritional Needs:   Kcal:  1800-2000  Protein:  80-90 grams  Fluid:  1.8 - 2 L/day  EDUCATION NEEDS:   No education needs identified at this time  01/27/17, MS, RD, LDN Pager # [phone] After hours/ weekend pager # [phone]

## 2016-11-30 NOTE — Transfer of Care (Signed)
Immediate Anesthesia Transfer of Care Note  Patient: Madison [name]  Procedure(s) Performed: Procedure(s): INTRAMEDULLARY (IM) NAIL LEFT HIP (Left)  Patient Location: PACU  Anesthesia Type:General  Level of Consciousness: awake, alert  and oriented  Airway & Oxygen Therapy: Patient Spontanous Breathing and Patient connected to face mask oxygen  Post-op Assessment: Report given to RN, Post -op Vital signs reviewed and stable and Patient moving all extremities  Post vital signs: Reviewed and stable  Last Vitals:  Vitals:   11/30/16 0858 11/30/16 1140  BP: (!) 163/75 131/60  Pulse: 92 88  Resp: 16 13  Temp: 36.9 C 36.7 C    Last Pain:  Vitals:   11/30/16 1140  TempSrc:   PainSc: (P) Asleep      Patients Stated Pain Goal: 3 (11/30/16 0520)  Complications: No apparent anesthesia complications

## 2016-11-30 NOTE — Progress Notes (Signed)
Report called to Jessica Edwards-RN in short stay. All questions/concerns addressed. Will continue to monitor 

## 2016-11-30 NOTE — Anesthesia Preprocedure Evaluation (Signed)
Anesthesia Evaluation  Patient identified by MRN, date of birth, ID band Patient awake    Reviewed: Allergy & Precautions, H&P , Patient's Chart, lab work & pertinent test results, reviewed documented beta blocker date and time   Airway Mallampati: II  TM Distance: >3 FB Neck ROM: full    Dental no notable dental hx.    Pulmonary    Pulmonary exam normal breath sounds clear to auscultation       Cardiovascular hypertension, +CHF   Rhythm:regular Rate:Normal     Neuro/Psych CVA, Residual Symptoms    GI/Hepatic   Endo/Other  diabetes  Renal/GU      Musculoskeletal   Abdominal   Peds  Hematology   Anesthesia Other Findings L sided weakness No rales  Reproductive/Obstetrics                             Anesthesia Physical Anesthesia Plan  ASA: II  Anesthesia Plan: General   Post-op Pain Management:    Induction: Intravenous  Airway Management Planned: Oral ETT  Additional Equipment:   Intra-op Plan:   Post-operative Plan: Extubation in OR  Informed Consent: I have reviewed the patients History and Physical, chart, labs and discussed the procedure including the risks, benefits and alternatives for the proposed anesthesia with the patient or authorized representative who has indicated his/her understanding and acceptance.   Dental Advisory Given and Dental advisory given  Plan Discussed with: CRNA and Surgeon  Anesthesia Plan Comments: (  Discussed general anesthesia, including possible nausea, instrumentation of airway, sore throat,pulmonary aspiration, etc. I asked if the were any outstanding questions, or  concerns before we proceeded.)        Anesthesia Quick Evaluation

## 2016-11-30 NOTE — Progress Notes (Signed)
ANTICOAGULATION CONSULT NOTE - Initial Consult  Pharmacy Consult for heparin Indication: mechanical AVR  Patient Measurements: Height: 5\' 3"  (160 cm) Weight: 146 lb (66.2 kg) IBW/kg (Calculated) : 52.4 Heparin Dosing Weight: 66 kg  Vital Signs: Temp: 97.7 F (36.5 C) (01/12 1239) Temp Source: Oral (01/12 0323) BP: 120/52 (01/12 1239) Pulse Rate: 100 (01/12 1239)  Labs:  Recent Labs  11/29/16 2015 11/30/16 0546  HGB 10.3*  --   HCT 33.0*  --   PLT 338  --   LABPROT 17.5*  --   INR 1.43  --   HEPARINUNFRC  --  0.81*  CREATININE 0.90  --     Assessment: 55 YOM on warfarin PTA for hx CVA/mech AVR. Admiit INR was 1.43. The patient's warfarin was held on admission for a hip surgery by ortho done 1/12.  A Heparin bridge was started pre-op and is now to resume at midnight post-op. Warfarin is also to be resumed today.   The patient's heparin level was elevated this morning at 0.81 prior to being turned off for surgery. Will restart at midnight at a lower rate. INR 1.43 from yesterday evening. Noted PTA dose range at facility was 5 mg-10mg  and from old anticoag clinic note was 7.5 mg daily. Old dosing records in July show a rapid rise to therapeutic range after 10 mg x 3 days.   Goal of Therapy:  INR 2.5-3.5 Monitor platelets by anticoagulation protocol: Yes   Plan 1. Restart Heparin at a rate of 850 units/hr (8.5 ml/hr) starting at midnight 2. Warfarin 10 mg x 1 dose at 1800 today 3. Daily PT/INR, HL, CBC 4. Will continue to monitor for any signs/symptoms of bleeding and will follow up with heparin level in 6 hours after restart and PT/INR in the AM  Thank you for allowing pharmacy to be a part of this patient's care.  August, PharmD, BCPS Clinical Pharmacist Pager: [phone] Clinical phone for 11/30/2016 from 7a-3:30p: [phone] If after 3:30p, please call main pharmacy at: [phone] 11/30/2016 1:29 PM

## 2016-11-30 NOTE — Anesthesia Postprocedure Evaluation (Signed)
Anesthesia Post Note  Patient: Madison [name]  Procedure(s) Performed: Procedure(s) (LRB): INTRAMEDULLARY (IM) NAIL LEFT HIP (Left)  Patient location during evaluation: PACU Anesthesia Type: General Level of consciousness: sedated Pain management: satisfactory to patient Vital Signs Assessment: post-procedure vital signs reviewed and stable Respiratory status: spontaneous breathing Cardiovascular status: stable Anesthetic complications: no       Last Vitals:  Vitals:   11/30/16 1223 11/30/16 1239  BP: 130/62 (!) 120/52  Pulse: (!) 102 100  Resp: 16   Temp: 36.7 C 36.5 C    Last Pain:  Vitals:   11/30/16 1347  TempSrc:   PainSc: Asleep                 Juston Goheen EDWARD

## 2016-11-30 NOTE — Anesthesia Procedure Notes (Signed)
Procedure Name: Intubation Date/Time: 11/30/2016 9:56 AM Performed by: Kyung Rudd Pre-anesthesia Checklist: Patient identified, Emergency Drugs available, Suction available and Patient being monitored Patient Re-evaluated:Patient Re-evaluated prior to inductionOxygen Delivery Method: Circle system utilized Preoxygenation: Pre-oxygenation with 100% oxygen Intubation Type: IV induction Ventilation: Mask ventilation without difficulty Laryngoscope Size: Mac and 3 Grade View: Grade I Tube type: Oral Tube size: 7.0 mm Number of attempts: 1 Airway Equipment and Method: Stylet Placement Confirmation: ETT inserted through vocal cords under direct vision,  positive ETCO2 and breath sounds checked- equal and bilateral Secured at: 21 cm Tube secured with: Tape Dental Injury: Teeth and Oropharynx as per pre-operative assessment

## 2016-11-30 NOTE — Evaluation (Signed)
Physical Therapy Evaluation Patient Details Name: Madison [name] MRN: [id number] DOB: 1961/10/20 Today's Date: 11/30/2016   History of Present Illness  Pt is a 56 y.o. female now s/p Lt hip fx with IM implant. PMH: stroke, RA, MI, HTN, diabetes, CAD, CHF.   Clinical Impression  Patient is s/p above surgery resulting in functional limitations due to the deficits listed below (see PT Problem List). Pt living at [facility] and planning to return following acute stay. At this time the pt was able to perform stand-pivot transfer from bed to chair with max assist. Pt was motivated and assisting as able. Due to prior stroke, pt has limited functional use of Lt UE which will limit some mobility options. PT to continue to follow to progress mobility and safety.     Follow Up Recommendations SNF    Equipment Recommendations   (to be addressed at next venue)    Recommendations for Other Services       Precautions / Restrictions Precautions Precautions: Fall Restrictions Weight Bearing Restrictions: Yes LLE Weight Bearing: Weight bearing as tolerated      Mobility  Bed Mobility Overal bed mobility: Needs Assistance Bed Mobility: Supine to Sit     Supine to sit: Max assist     General bed mobility comments: assist provided with LEs and trunk to come to sitting. Pt helping with Rt UE/LE.   Transfers Overall transfer level: Needs assistance Equipment used: None Transfers: Stand Pivot Transfers   Stand pivot transfers: Max assist       General transfer comment: Attempting to stand X2 with pain limiting initial attempt. Pt placing minimal weight through LLE due to reports of pain.   Ambulation/Gait             General Gait Details: unable to attempt  Stairs            Wheelchair Mobility    Modified Rankin (Stroke Patients Only)       Balance Overall balance assessment: Needs assistance Sitting-balance support: Single extremity supported Sitting balance-Leahy Scale:  Fair     Standing balance support: Single extremity supported Standing balance-Leahy Scale: Poor Standing balance comment: requiring physical assist to maintain                             Pertinent Vitals/Pain Pain Assessment: 0-10 Pain Score: 7  Pain Location: Lt hip with movement Pain Descriptors / Indicators: Aching;Grimacing;Guarding;Moaning Pain Intervention(s): Limited activity within patient's tolerance;Monitored during session;Ice applied    Home Living Family/patient expects to be discharged to:: Skilled nursing facility                      Prior Function Level of Independence: Needs assistance   Gait / Transfers Assistance Needed: independent per report without an assistive device  ADL's / Homemaking Assistance Needed: assist with bathing/dressing        Hand Dominance        Extremity/Trunk Assessment   Upper Extremity Assessment Upper Extremity Assessment: LUE deficits/detail LUE Deficits / Details: pt with hemiplegia and spasticity from prior stroke    Lower Extremity Assessment Lower Extremity Assessment: LLE deficits/detail LLE Deficits / Details: limited active motion due to pain       Communication   Communication: No difficulties  Cognition Arousal/Alertness: Awake/alert Behavior During Therapy: WFL for tasks assessed/performed Overall Cognitive Status: Within Functional Limits for tasks assessed  General Comments      Exercises     Assessment/Plan    PT Assessment Patient needs continued PT services  PT Problem List Decreased strength;Decreased range of motion;Decreased activity tolerance;Decreased balance;Decreased mobility          PT Treatment Interventions DME instruction;Gait training;Functional mobility training;Therapeutic activities;Therapeutic exercise;Patient/family education    PT Goals (Current goals can be found in the Care Plan section)  Acute Rehab PT Goals Patient  Stated Goal: move and not hurt PT Goal Formulation: With patient Time For Goal Achievement: 12/14/16 Potential to Achieve Goals: Fair    Frequency Min 3X/week   Barriers to discharge        Co-evaluation               End of Session Equipment Utilized During Treatment: Gait belt Activity Tolerance: Patient limited by pain Patient left: in chair;with call bell/phone within reach;with nursing/sitter in room;with chair alarm set Nurse Communication: Mobility status;Need for lift equipment;Weight bearing status         Time: 3382-5053 PT Time Calculation (min) (ACUTE ONLY): 27 min   Charges:   PT Evaluation $PT Eval Moderate Complexity: 1 Procedure PT Treatments $Therapeutic Activity: 8-22 mins   PT G Codes:        Christiane Ha, PT, CSCS Pager [phone] Office [phone]  11/30/2016, 4:35 PM

## 2016-11-30 NOTE — Consult Note (Signed)
ORTHOPAEDIC CONSULTATION  REQUESTING PHYSICIAN: Zannie Cove, MD  PCP:  [name], NP  Chief Complaint: left hip pain  HPI: Madison [name] is a 56 y.o. female who complains of  Left hip pain following a fall yesterday.  She lives in an AL facility and sustained a mechanical fall yesterday afternoon.  She was unable to weight bear due to left hip pain following the incident.  At her baseline she has a hx of CVA leaving her with left upper extremity weakness and unuseable arm, but the leg on the left functions very well.  She denies any current distal neurologic symptoms at this time.  She is on warfarin for a hx of mechanical valve and currently her INR is 1.48.    Past Medical History:  Diagnosis Date  . Allergy   . Anxiety   . Aortic stenosis   . CAD (coronary artery disease)   . CHF (congestive heart failure) (HCC)   . Depression   . Diabetes (HCC) dx'd 1982   "went straight to insulin"  . Dyslipidemia   . GERD (gastroesophageal reflux disease)   . Heart murmur   . HTN (hypertension)   . Hypercholesteremia   . Myocardial infarction 12/2014  . Obesity   . Pneumonia 11/2014; 12/2014  . Rheumatoid arthritis(714.0)    "hands" (07/01/2015)  . Stroke syndrome [facility]) 1995; 2001   "when my son was born; problems w/speech and L hand since then" (01/19/2015)  . Thrombophlebitis    Past Surgical History:  Procedure Laterality Date  . AORTIC VALVE REPLACEMENT (AVR)/CORONARY ARTERY BYPASS GRAFTING (CABG)  "2014"   [name] 03/08/2014  . CARDIAC CATHETERIZATION  12/08/2014  . CATARACT EXTRACTION Left   . CESAREAN SECTION  1995  . CORONARY ANGIOPLASTY WITH STENT PLACEMENT  12/09/2014  . CORONARY ARTERY BYPASS GRAFT    . FOOT FRACTURE SURGERY    . FRACTURE SURGERY    . KNEE ARTHROSCOPY Right   . LEFT HEART CATHETERIZATION WITH CORONARY ANGIOGRAM N/A 12/08/2014   Procedure: LEFT HEART CATHETERIZATION WITH CORONARY ANGIOGRAM;  Surgeon: Iran Ouch, MD;  Location: [facility] CATH LAB;   Service: Cardiovascular;  Laterality: N/A;  . NEUROPLASTY / TRANSPOSITION MEDIAN NERVE AT CARPAL TUNNEL    . ORIF ANKLE FRACTURE Left 07/04/2015   Procedure: OPEN REDUCTION INTERNAL FIXATION (ORIF)  TRIMAL ANKLE FRACTURE;  Surgeon: [name], MD;  Location: [facility] OR;  Service: Orthopedics;  Laterality: Left;  . PERCUTANEOUS CORONARY STENT INTERVENTION (PCI-S) N/A 12/09/2014   Procedure: PERCUTANEOUS CORONARY STENT INTERVENTION (PCI-S);  Surgeon: [name], MD;  Location: [facility] CATH LAB;  Service: Cardiovascular;  Laterality: N/A;  . TUBAL LIGATION  1995   Social History   Social History  . Marital status: Divorced    Spouse name: N/A  . Number of children: N/A  . Years of education: N/A   Social History Main Topics  . Smoking status: Never Smoker  . Smokeless tobacco: Never Used  . Alcohol use No  . Drug use: No  . Sexual activity: No   Other Topics Concern  . None   Social History Narrative   Divroced and lives at home.    Has two sons who live in [place] at this time. (21&8 y/o).    Has a cat    Walks a lot.       She is having great success with her diet       Working with exercises that were given to her by PT/OT.  Family History  Problem Relation Age of Onset  . Heart disease Mother   . Heart disease Sister   . Stroke     Allergies  Allergen Reactions  . Adhesive [Tape] Other (See Comments)    Reaction:  Burning   . Celebrex [Celecoxib] Rash  . Detrol [Tolterodine] Hives   Prior to Admission medications   Medication Sig Start Date End Date Taking? Authorizing Provider  Acetaminophen (CHLORASEPTIC SORE THROAT PO) Take 1 spray by mouth every 2 (two) hours as needed (for sore throat).   Yes Historical Provider, MD  citalopram (CELEXA) 40 MG tablet Take 1 tablet (40 mg total) by mouth daily. 03/08/16  Yes [name], NP  docusate sodium (COLACE) 100 MG capsule Take 100 mg by mouth 2 (two) times daily.   Yes Historical Provider, MD  ergocalciferol (VITAMIN D2)  50000 units capsule Take 50,000 Units by mouth every Wednesday.    Yes Historical Provider, MD  guaiFENesin-dextromethorphan (ROBITUSSIN DM) 100-10 MG/5ML syrup Take 15 mLs by mouth every 4 (four) hours as needed for cough.   Yes Historical Provider, MD  insulin aspart (NOVOLOG) 100 UNIT/ML injection Inject 3 Units into the skin 3 (three) times daily with meals. Patient taking differently: Inject 1-9 Units into the skin 3 (three) times daily with meals. Per sliding scale: BGL 101-150 = 1 unit; 151-200 = 2 units; 201-250 = 3 units; 251-300 = 5 units; 301-350 = 7 units; >350 = 9 units; Call MD for BGL >400 or <60 10/24/16  Yes Omair Latif Sheikh, DO  insulin detemir (LEVEMIR) 100 UNIT/ML injection Inject 0.07 mLs (7 Units total) into the skin 2 (two) times daily. Patient taking differently: Inject 7 Units into the skin 2 (two) times daily. TO BE INCREASED TO "10 units two times a day/0800 & 1700" 10/24/16  Yes Omair Latif Sheikh, DO  Nutritional Supplements (NUTRITIONAL DRINK PO) Take 60 mLs by mouth 3 (three) times daily. MEDPASS 1.7   Yes Historical Provider, MD  omeprazole (PRILOSEC) 40 MG capsule Take 40 mg by mouth daily before breakfast.   Yes Historical Provider, MD  pravastatin (PRAVACHOL) 20 MG tablet Take 20 mg by mouth every evening.   Yes Historical Provider, MD  traMADol (ULTRAM) 50 MG tablet Take 1 tablet (50 mg total) by mouth every 6 (six) hours as needed for moderate pain or severe pain. Patient taking differently: Take 50 mg by mouth every 6 (six) hours as needed (for pain).  10/24/16  Yes Omair Latif Sheikh, DO  WARFARIN SODIUM PO Take by mouth.   Yes Historical Provider, MD  zolpidem (AMBIEN) 5 MG tablet Take 5 mg by mouth at bedtime. FOR SLEEP    Historical Provider, MD   Dg Chest 2 View  Result Date: 11/29/2016 CLINICAL DATA:  Fall from standing EXAM: CHEST  2 VIEW COMPARISON:  10/21/2016 FINDINGS: Post sternotomy changes. No acute infiltrate or effusion. Stable cardiomediastinal  silhouette. No pneumothorax. IMPRESSION: No active cardiopulmonary disease. Electronically Signed   By: Jasmine Pang M.D.   On: 11/29/2016 19:46   Dg Knee Left Port  Result Date: 11/29/2016 CLINICAL DATA:  Left hip fracture. EXAM: PORTABLE LEFT KNEE - 1-2 VIEW COMPARISON:  None. FINDINGS: No evidence of fracture, dislocation, or joint effusion. No evidence of arthropathy or other focal bone abnormality. Vascular calcifications noted. Soft tissues are otherwise unremarkable. IMPRESSION: No acute fracture or dislocation identified about the left knee. Electronically Signed   By: Ted Mcalpine M.D.   On: 11/29/2016 21:10  Dg Hip Unilat With Pelvis 2-3 Views Left  Result Date: 11/29/2016 CLINICAL DATA:  Fall from standing.  Left hip pain. EXAM: DG HIP (WITH OR WITHOUT PELVIS) 2-3V LEFT COMPARISON:  None. FINDINGS: There is a comminuted intratrochanteric left proximal femoral fracture with a large butterfly fragment consisting of the lesser trochanter. The fracture is mildly impacted with no significant angulation. Soft tissue calcifications are seen. IMPRESSION: Comminuted impacted intertrochanteric left proximal femoral fracture. Electronically Signed   By: Ted Mcalpine M.D.   On: 11/29/2016 19:46    Positive ROS: All other systems have been reviewed and were otherwise negative with the exception of those mentioned in the HPI and as above.  Physical Exam: General: Alert, no acute distress Cardiovascular: No pedal edema Respiratory: No cyanosis, no use of accessory musculature GI: No organomegaly, abdomen is soft and non-tender Skin: No lesions in the area of chief complaint Neurologic: Sensation intact distally Psychiatric: Patient is competent for consent with normal mood and affect Lymphatic: No axillary or cervical lymphadenopathy  MUSCULOSKELETAL:  LLE- held in IR and shortened.  Distally she is NVI.      Assessment: Left hip intertrochanteric fracture  Plan: -plan for  OR today for IMN of left hip -The risks, benefits, and alternatives were discussed with the patient. There are risks associated with the surgery including, but not limited to, problems with anesthesia (death), infection, differences in leg length/angulation/rotation, fracture of bones, loosening or failure of implants, malunion, nonunion, hematoma (blood accumulation) which may require surgical drainage, blood clots, pulmonary embolism, nerve injury (foot drop), and blood vessel injury. The patient understands these risks and elects to proceed. -will return to medicine floor post op for PT and pain control -ok to resume heparin gtt tonight at midnight and then bridge to warfarin as directed by IM team -appreciate medicine for helping prepare her for surgery.    Yolonda Kida, MD Cell [phone]    11/30/2016 9:21 AM

## 2016-11-30 NOTE — Op Note (Addendum)
    Date of Surgery: 11/30/2016  INDICATIONS: Madison [name] is a 56 y.o.-year-old female who sustained a left hip fracture. The risks and benefits of the procedure discussed with the patient prior to the procedure and all questions were answered; consent was obtained.  PREOPERATIVE DIAGNOSIS: left hip fracture   POSTOPERATIVE DIAGNOSIS: Same   PROCEDURE: Treatment of intertrochanteric, pertrochanteric, subtrochanteric fracture with intramedullary implant. CPT [phone]   SURGEON: Kathi Der. Aundria Rud, M.D.   ANESTHESIA: general   IV FLUIDS AND URINE: See anesthesia record   ESTIMATED BLOOD LOSS: 100 cc  IMPLANTS:  Synthes TFN with compression screw 10 mm  Diameter, intermediate length 90 mm compression screw  DRAINS: None.   COMPLICATIONS: None.   DESCRIPTION OF PROCEDURE: The patient was brought to the operating room and placed supine on the operating table. The patient's leg had been signed prior to the procedure. The patient had the anesthesia placed by the anesthesiologist. The prep verification and incision time-outs were performed to confirm that this was the correct patient, site, side and location. The patient had an SCD on the opposite lower extremity. The patient did receive antibiotics prior to the incision and was re-dosed during the procedure as needed at indicated intervals. The patient was positioned on the Hana table with the leg in traction and internal rotation to reduce the hip. The well leg was placed in a scissor position and all bony prominences were well-padded. The patient had the lower extremity prepped and draped in the standard surgical fashion. The incision was made 4 finger breadths superior to the greater trochanter. A guide pin was inserted into the tip of the greater trochanter under fluoroscopic guidance. An opening reamer was used to gain access to the femoral canal. The nail length was measured and inserted down the femoral canal to its proper depth. The  appropriate version of insertion for the lag screw was found under fluoroscopy. A pin was inserted up the femoral neck through the jig. Then, a second antirotation pin was inserted anterior to the first pin. The length of the lag screw was then measured. The lag screw was inserted as near to center-center in the head as possible. The antirotation pin was then taken out. Compression was visualized on serial xrays. The wound was copiously irrigated with saline and the subcutaneous layer closed with 2.0 vicryl and the skin was reapproximated with staples. The wounds were cleaned and dried a final time and a sterile dressing was placed. The hip was taken through a range of motion at the end of the case under fluoroscopic imaging to visualize the approach-withdraw phenomenon and confirm implant length in the head. The patient was then awakened from anesthesia and taken to the recovery room in stable condition. All counts were correct at the end of the case.   POSTOPERATIVE PLAN: Madison [name] will be weight bearing as tolerated and will return in 2 weeks for staple removal and the patient will receive DVT prophylaxis based on other medications, activity level, and risk ratio of bleeding to thrombosis.  Ok to restart her bridge to therapeutic warfarin with heparin tonight after midnight.   Maryan Rued, MD [facility] [phone] 11:29 AM

## 2016-12-01 ENCOUNTER — Inpatient Hospital Stay (HOSPITAL_COMMUNITY): Payer: Medicare HMO

## 2016-12-01 LAB — URIC ACID: URIC ACID, SERUM: 2.9 mg/dL (ref 2.3–6.6)

## 2016-12-01 LAB — GLUCOSE, CAPILLARY
GLUCOSE-CAPILLARY: 167 mg/dL — AB (ref 65–99)
GLUCOSE-CAPILLARY: 183 mg/dL — AB (ref 65–99)
GLUCOSE-CAPILLARY: 362 mg/dL — AB (ref 65–99)
GLUCOSE-CAPILLARY: 362 mg/dL — AB (ref 65–99)
Glucose-Capillary: 194 mg/dL — ABNORMAL HIGH (ref 65–99)
Glucose-Capillary: 264 mg/dL — ABNORMAL HIGH (ref 65–99)
Glucose-Capillary: 297 mg/dL — ABNORMAL HIGH (ref 65–99)

## 2016-12-01 LAB — BASIC METABOLIC PANEL
ANION GAP: 9 (ref 5–15)
BUN: 6 mg/dL (ref 6–20)
CALCIUM: 8.6 mg/dL — AB (ref 8.9–10.3)
CO2: 24 mmol/L (ref 22–32)
Chloride: 100 mmol/L — ABNORMAL LOW (ref 101–111)
Creatinine, Ser: 0.78 mg/dL (ref 0.44–1.00)
GFR calc Af Amer: >60 mL/min (ref >60)
GFR calc non Af Amer: >60 mL/min (ref >60)
GLUCOSE: 206 mg/dL — AB (ref 65–99)
Potassium: 4.3 mmol/L (ref 3.5–5.1)
Sodium: 133 mmol/L — ABNORMAL LOW (ref 135–145)

## 2016-12-01 LAB — PROTIME-INR
INR: 2.08
PROTHROMBIN TIME: 23.7 s — AB (ref 11.4–15.2)

## 2016-12-01 LAB — HEPARIN LEVEL (UNFRACTIONATED)
HEPARIN UNFRACTIONATED: 0.13 [IU]/mL — AB (ref 0.30–0.70)
Heparin Unfractionated: 0.23 IU/mL — ABNORMAL LOW (ref 0.30–0.70)
Heparin Unfractionated: 0.35 IU/mL (ref 0.30–0.70)

## 2016-12-01 LAB — CBC
HCT: 25.7 % — ABNORMAL LOW (ref 36.0–46.0)
HEMOGLOBIN: 8 g/dL — AB (ref 12.0–15.0)
MCH: 23.7 pg — AB (ref 26.0–34.0)
MCHC: 31.1 g/dL (ref 30.0–36.0)
MCV: 76.3 fL — AB (ref 78.0–100.0)
Platelets: 260 10*3/uL (ref 150–400)
RBC: 3.37 MIL/uL — AB (ref 3.87–5.11)
RDW: 17.3 % — ABNORMAL HIGH (ref 11.5–15.5)
WBC: 7 10*3/uL (ref 4.0–10.5)

## 2016-12-01 MED ORDER — HEPARIN BOLUS VIA INFUSION
1500.0000 [IU] | Freq: Once | INTRAVENOUS | Status: AC
  Administered 2016-12-01: 1500 [IU] via INTRAVENOUS
  Filled 2016-12-01: qty 1500

## 2016-12-01 MED ORDER — METHYLPREDNISOLONE SODIUM SUCC 125 MG IJ SOLR
60.0000 mg | Freq: Two times a day (BID) | INTRAMUSCULAR | Status: AC
  Administered 2016-12-01 (×2): 60 mg via INTRAVENOUS
  Filled 2016-12-01 (×2): qty 2

## 2016-12-01 MED ORDER — WARFARIN SODIUM 2 MG PO TABS
2.0000 mg | ORAL_TABLET | Freq: Once | ORAL | Status: AC
  Administered 2016-12-01: 2 mg via ORAL
  Filled 2016-12-01: qty 1

## 2016-12-01 NOTE — Evaluation (Signed)
Occupational Therapy Evaluation Patient Details Name: Madison [name] MRN: [id number] DOB: [birth year] Today's Date: 12/01/2016    History of Present Illness Pt is a 56 y.o. female now s/p Lt hip fx with IM implant. PMH: stroke, RA, MI, HTN, diabetes, CAD, CHF.    Clinical Impression   Pt. Is a long term resident of a SNF. Pt. Plans to return there for long term placement. Pt. OT needs can be addressed when pt. Returns to SNF. Pt. Does not need further acute OT services.    Follow Up Recommendations  SNF    Equipment Recommendations  None recommended by OT    Recommendations for Other Services       Precautions / Restrictions Precautions Precautions: Fall Restrictions Weight Bearing Restrictions: Yes LLE Weight Bearing: Weight bearing as tolerated      Mobility Bed Mobility         Supine to sit: Max assist;+2 for physical assistance        Transfers     Transfers: Stand Pivot Transfers   Stand pivot transfers: Total assist;+2 physical assistance       General transfer comment:  (Pt. screaming throughout transfer)    Balance                                            ADL Overall ADL's : Needs assistance/impaired Eating/Feeding: Set up;Sitting Eating/Feeding Details (indicate cue type and reason): assist with opening containers and cutting food Grooming: Wash/dry hands;Wash/dry face;Set up   Upper Body Bathing: Moderate assistance;Sitting   Lower Body Bathing: Total assistance;+2 for physical assistance;Sit to/from stand   Upper Body Dressing : Moderate assistance;Sitting   Lower Body Dressing: Total assistance;+2 for physical assistance   Toilet Transfer: Total assistance;+2 for physical assistance;Stand-pivot   Toileting- Clothing Manipulation and Hygiene: Total assistance       Functional mobility during ADLs: Total assistance;+2 for physical assistance General ADL Comments: Pt. will need further training to increase I  with ADLs which can be addressed at SNF.     Vision     Perception     Praxis      Pertinent Vitals/Pain Pain Assessment: 0-10 Pain Score: 7  Pain Location:  (L hip with movement) Pain Descriptors / Indicators: Aching Pain Intervention(s): Limited activity within patient's tolerance     Hand Dominance     Extremity/Trunk Assessment Upper Extremity Assessment Upper Extremity Assessment: LUE deficits/detail LUE Deficits / Details: pt with hemiplegia and spasticity from prior stroke LUE Coordination: decreased fine motor;decreased gross motor           Communication Communication Communication: No difficulties   Cognition Arousal/Alertness: Awake/alert Behavior During Therapy: WFL for tasks assessed/performed Overall Cognitive Status: Within Functional Limits for tasks assessed                     General Comments       Exercises       Shoulder Instructions      Home Living Family/patient expects to be discharged to:: Skilled nursing facility                                        Prior Functioning/Environment Level of Independence: Needs assistance  Gait / Transfers Assistance Needed: independent per report without an  assistive device ADL's / Homemaking Assistance Needed: assist with bathing/dressing            OT Problem List:     OT Treatment/Interventions:      OT Goals(Current goals can be found in the care plan section) Acute Rehab OT Goals Patient Stated Goal: stop hurting  OT Frequency:     Barriers to D/C:            Co-evaluation              End of Session    Activity Tolerance: No increased pain Patient left: in chair;with call bell/phone within reach;with chair alarm set   Time: 0272-5366 OT Time Calculation (min): 38 min Charges:  OT General Charges $OT Visit: 1 Procedure OT Evaluation $OT Eval Moderate Complexity: 1 Procedure OT Treatments $Self Care/Home Management : 8-22 mins G-Codes:     Hollie Bartus 12/12/16, 12:34 PM

## 2016-12-01 NOTE — Progress Notes (Signed)
ANTICOAGULATION CONSULT NOTE - Follow Up Consult  Pharmacy Consult for Heparin Indication: mechanical AVR  Allergies  Allergen Reactions  . Adhesive [Tape] Other (See Comments)    Reaction:  Burning   . Celebrex [Celecoxib] Rash  . Detrol [Tolterodine] Hives    Patient Measurements: Height: 5\' 3"  (160 cm) Weight: 146 lb (66.2 kg) IBW/kg (Calculated) : 52.4  Vital Signs: Temp: 99.1 F (37.3 C) (01/13 1500) Temp Source: Oral (01/13 1500) BP: 119/58 (01/13 1500) Pulse Rate: 101 (01/13 1500)  Labs:  Recent Labs  11/29/16 2015  12/01/16 0537 12/01/16 0538 12/01/16 0954 12/01/16 1836  HGB 10.3*  --  8.0*  --   --   --   HCT 33.0*  --  25.7*  --   --   --   PLT 338  --  260  --   --   --   LABPROT 17.5*  --  23.7*  --   --   --   INR 1.43  --  2.08  --   --   --   HEPARINUNFRC  --   < >  --  0.23* 0.13* 0.35  CREATININE 0.90  --  0.78  --   --   --   < > = values in this interval not displayed.  Estimated Creatinine Clearance: 72.6 mL/min (by C-G formula based on SCr of 0.78 mg/dL).   Medications:  Heparin @ 950 units/hr  Assessment: 55yof continues on heparin bridge to coumadin for her mechanical AVR. Heparin level is now therapeutic at 0.35.  Goal of Therapy:  Heparin level 0.3-0.7 units/ml Monitor platelets by anticoagulation protocol: Yes   Plan:  1) Continue heparin at 950 units/hr 2) Follow up daily heparin level and CBC  12/03/16 12/01/2016,7:15 PM

## 2016-12-01 NOTE — Progress Notes (Addendum)
Physical Therapy Treatment Patient Details Name: Madison [name] MRN: [id number] DOB: 1961/04/04 Today's Date: 12/01/2016    History of Present Illness Pt is a 56 y.o. female now s/p Lt hip fx with IM implant. PMH: stroke, RA, MI, HTN, diabetes, CAD, CHF.     PT Comments    Patient reported pain with transfers but was able to transfer to the commode and to the chair with (+) 2 max a transfer. Nursing notified and feel comfortable transferring her back to the bed. She would benefit from rehab at a SNF.   Follow Up Recommendations  SNF     Equipment Recommendations       Recommendations for Other Services       Precautions / Restrictions Precautions Precautions: Fall Restrictions Weight Bearing Restrictions: Yes LLE Weight Bearing: Weight bearing as tolerated    Mobility  Bed Mobility         Supine to sit: Max assist;+2 for physical assistance     General bed mobility comments: Max a to control the right leg and max a to sit up in the bed. The patient verbalized pain through the entire transfer.   Transfers     Transfers: Stand Pivot Transfers   Stand pivot transfers: Total assist;+2 physical assistance       General transfer comment: total assist 2nd to pain to transfer to the commode then back to the chair.  Minimal weight bearing on the left lower extrmeity   Ambulation/Gait                 Stairs            Wheelchair Mobility    Modified Rankin (Stroke Patients Only)       Balance Overall balance assessment: Needs assistance Sitting-balance support: Single extremity supported Sitting balance-Leahy Scale: Fair     Standing balance support: Single extremity supported Standing balance-Leahy Scale: Poor                      Cognition Arousal/Alertness: Awake/alert Behavior During Therapy: WFL for tasks assessed/performed Overall Cognitive Status: Within Functional Limits for tasks assessed                       Exercises Total Joint Exercises Ankle Circles/Pumps: 15 reps (able to do left despite paim) Quad Sets: 10 reps (able to show quad contraction on the left ) Heel Slides: 15 reps (right only )    General Comments        Pertinent Vitals/Pain Pain Assessment: 0-10 Pain Score: 8  Pain Location:  (L hip with movement) Pain Descriptors / Indicators: Aching Pain Intervention(s): Limited activity within patient's tolerance;Premedicated before session;Monitored during session;Repositioned;Patient requesting pain meds-RN notified;Relaxation    Home Living Family/patient expects to be discharged to:: Skilled nursing facility                    Prior Function Level of Independence: Needs assistance  Gait / Transfers Assistance Needed: independent per report without an assistive device ADL's / Homemaking Assistance Needed: assist with bathing/dressing     PT Goals (current goals can now be found in the care plan section) Acute Rehab PT Goals Patient Stated Goal: stop hurting PT Goal Formulation: With patient Time For Goal Achievement: 12/14/16 Potential to Achieve Goals: Fair Progress towards PT goals: Progressing toward goals    Frequency    Min 3X/week      PT Plan Current plan remains appropriate  Co-evaluation          End of Session Equipment Utilized During Treatment: Gait belt Activity Tolerance: Patient limited by pain Patient left: in chair;with call bell/phone within reach;with nursing/sitter in room;with chair alarm set     Time: 1100-1120 PT DPT  PT Time Calculation (min) (ACUTE ONLY): 20 min  Charges:  $Therapeutic Activity: 8-22 mins                    G Codes:      Dessie Coma 12-07-16, 1:27 PM

## 2016-12-01 NOTE — Progress Notes (Signed)
PROGRESS NOTE    Madison [name]  IOE:[id number] DOB: 08/12/1961 DOA: 11/29/2016 PCP: [name], NP  Brief Narrative: Madison [name] is a 56 y.o. female with medical history significant of IDDM, mechanical AVR, stroke, CAD, diastolic grade 1 CHF on echo less than a year ago, no systolic dysfunction.  Patient presented to the ED  following a mechanical fall at her facility, Xray noted  hip intertroch fx. Ortho consulting  Assessment & Plan:   Principal Problem:  Closed displaced intertrochanteric fracture of left femur (HCC) -s/p  Treatment of intertrochanteric, pertrochanteric, subtrochanteric fracture with intramedullary implant. By Dr.Rogers 1/12 -heparin/coumadin resumed -PT consulting    Diabetes type 1, uncontrolled (HCC) -resumed levemir at 7units BID on 10BID at SNF -h/o very brittle DM -stable for now, monitor   S/P AVR (aortic valve replacement) -on warfarin with heparin bridge per pharmacy   S/p CVA -anticoagulation as above  L ankle and great toe pain and swelling -suspect Gout, check Uric acid and Xray -if xray negative will give short course of steroids  Mild Cognitive dysfunction -now resides at SNF  Anxiety/Depression -continue celexa  CAD status post CABG  - denies any chest pain  DVT prophylaxis: Heparin IV  Code Status:Full Code Family Communication:None at bedside Disposition Plan: Back to SNF in ?2days  Consultants:   Ortho Dr.Rogers   Subjective: Severe L ankle and great toe pain  Objective: Vitals:   12/01/16 0020 12/01/16 0155 12/01/16 0157 12/01/16 0437  BP: (!) 114/52   123/63  Pulse: (!) 111   (!) 111  Resp: 16 18 18 16   Temp: 99.3 F (37.4 C)   99.4 F (37.4 C)  TempSrc: Oral   Oral  SpO2: 97% (!) 82% 95% 98%  Weight:      Height:        Intake/Output Summary (Last 24 hours) at 12/01/16 1211 Last data filed at 12/01/16 0900  Gross per 24 hour  Intake           2242.5 ml  Output             2900 ml  Net            -657.5 ml   Filed Weights   11/29/16 1824  Weight: 66.2 kg (146 lb)    Examination:  General exam: Appears calm and comfortable, AAOx3, mild distress Respiratory system: Clear to auscultation. Respiratory effort normal. Cardiovascular system: S1 & S2 heard, RRR. metallic click noted, . No pedal edema. Gastrointestinal system: Abdomen is nondistended, soft and nontender.  Normal bowel sounds heard. Central nervous system: Alert and oriented. No focal neurological deficits. Extremities: LUE chronically weak, L hip incision c/d/i  L ankle and great toe very tender and swollen Skin: as above Psychiatry: Judgement and insight appear normal. Mood & affect appropriate.     Data Reviewed: I have personally reviewed following labs and imaging studies  CBC:  Recent Labs Lab 11/29/16 2015 12/01/16 0537  WBC 8.5 7.0  NEUTROABS 6.1  --   HGB 10.3* 8.0*  HCT 33.0* 25.7*  MCV 74.2* 76.3*  PLT 338 260   Basic Metabolic Panel:  Recent Labs Lab 11/29/16 2015 12/01/16 0537  NA 132* 133*  K 4.3 4.3  CL 96* 100*  CO2 25 24  GLUCOSE 256* 206*  BUN 18 6  CREATININE 0.90 0.78  CALCIUM 9.2 8.6*   GFR: Estimated Creatinine Clearance: 72.6 mL/min (by C-G formula based on SCr of 0.78 mg/dL). Liver Function Tests: No results  for input(s): AST, ALT, ALKPHOS, BILITOT, PROT, ALBUMIN in the last 168 hours. No results for input(s): LIPASE, AMYLASE in the last 168 hours. No results for input(s): AMMONIA in the last 168 hours. Coagulation Profile:  Recent Labs Lab 11/29/16 2015 12/01/16 0537  INR 1.43 2.08   Cardiac Enzymes: No results for input(s): CKTOTAL, CKMB, CKMBINDEX, TROPONINI in the last 168 hours. BNP (last 3 results) No results for input(s): PROBNP in the last 8760 hours. HbA1C: No results for input(s): HGBA1C in the last 72 hours. CBG:  Recent Labs Lab 11/30/16 2009 12/01/16 0011 12/01/16 0420 12/01/16 0935 12/01/16 1138  GLUCAP 370* 183* 167* 194* 362*    Lipid Profile: No results for input(s): CHOL, HDL, LDLCALC, TRIG, CHOLHDL, LDLDIRECT in the last 72 hours. Thyroid Function Tests: No results for input(s): TSH, T4TOTAL, FREET4, T3FREE, THYROIDAB in the last 72 hours. Anemia Panel: No results for input(s): VITAMINB12, FOLATE, FERRITIN, TIBC, IRON, RETICCTPCT in the last 72 hours. Urine analysis:    Component Value Date/Time   COLORURINE YELLOW 11/29/2016 2327   APPEARANCEUR HAZY (A) 11/29/2016 2327   LABSPEC 1.019 11/29/2016 2327   PHURINE 5.0 11/29/2016 2327   GLUCOSEU 150 (A) 11/29/2016 2327   HGBUR NEGATIVE 11/29/2016 2327   BILIRUBINUR NEGATIVE 11/29/2016 2327   BILIRUBINUR n 06/16/2015 1244   KETONESUR 20 (A) 11/29/2016 2327   PROTEINUR NEGATIVE 11/29/2016 2327   UROBILINOGEN 0.2 07/30/2015 1415   NITRITE NEGATIVE 11/29/2016 2327   LEUKOCYTESUR MODERATE (A) 11/29/2016 2327   Sepsis Labs: @LABRCNTIP (procalcitonin:4,lacticidven:4)  ) Recent Results (from the past 240 hour(s))  Surgical pcr screen     Status: None   Collection Time: 11/29/16 11:21 PM  Result Value Ref Range Status   MRSA, PCR NEGATIVE NEGATIVE Final   Staphylococcus aureus NEGATIVE NEGATIVE Final    Comment:        The Xpert SA Assay (FDA approved for NASAL specimens in patients over 73 years of age), is one component of a comprehensive surveillance program.  Test performance has been validated by [facility] for patients greater than or equal to 27 year old. It is not intended to diagnose infection nor to guide or monitor treatment.          Radiology Studies: Dg Chest 2 View  Result Date: 11/29/2016 CLINICAL DATA:  Fall from standing EXAM: CHEST  2 VIEW COMPARISON:  10/21/2016 FINDINGS: Post sternotomy changes. No acute infiltrate or effusion. Stable cardiomediastinal silhouette. No pneumothorax. IMPRESSION: No active cardiopulmonary disease. Electronically Signed   By: 14/01/2016 M.D.   On: 11/29/2016 19:46   Dg Ankle Complete  Left  Result Date: 12/01/2016 CLINICAL DATA:  Recent fall and hip surgery, now with left ankle pain. Remote history of left ankle injury. EXAM: LEFT ANKLE COMPLETE - 3+ VIEW COMPARISON:  Intraoperative radiographs of the left ankle -07/04/2015 FINDINGS: No fracture or dislocation. Post lag and cancellous screw fixation of the medial malleolus and sideplate fixation of the distal fibular without evidence of hardware failure or loosening. Joint spaces are preserved. The ankle mortise is preserved given obliquity. No ankle joint effusion. No plantar calcaneal spur. Regional soft tissues appear normal. IMPRESSION: 1. No acute findings. 2. Post ORIF of the medial and lateral malleoli without evidence of hardware failure loosening. Electronically Signed   By: 07/06/2015 M.D.   On: 12/01/2016 09:10   Dg Knee Left Port  Result Date: 11/29/2016 CLINICAL DATA:  Left hip fracture. EXAM: PORTABLE LEFT KNEE - 1-2 VIEW COMPARISON:  None. FINDINGS:  No evidence of fracture, dislocation, or joint effusion. No evidence of arthropathy or other focal bone abnormality. Vascular calcifications noted. Soft tissues are otherwise unremarkable. IMPRESSION: No acute fracture or dislocation identified about the left knee. Electronically Signed   By: Ted Mcalpine M.D.   On: 11/29/2016 21:10   Dg C-arm 1-60 Min  Result Date: 11/30/2016 CLINICAL DATA:  Left femoral fracture, internal fixation EXAM: DG C-ARM 61-120 MIN; OPERATIVE LEFT HIP WITH PELVIS COMPARISON:  None. FINDINGS: Multiple intraoperative spot images demonstrate internal fixation across the left femoral intertrochanteric fracture. Anatomic alignment. No hardware or bony complicating feature. IMPRESSION: Internal fixation across the left intertrochanteric fracture with anatomic alignment. Electronically Signed   By: Charlett Nose M.D.   On: 11/30/2016 11:35   Dg Hip Operative Unilat W Or W/o Pelvis Left  Result Date: 11/30/2016 CLINICAL DATA:  Left femoral  fracture, internal fixation EXAM: DG C-ARM 61-120 MIN; OPERATIVE LEFT HIP WITH PELVIS COMPARISON:  None. FINDINGS: Multiple intraoperative spot images demonstrate internal fixation across the left femoral intertrochanteric fracture. Anatomic alignment. No hardware or bony complicating feature. IMPRESSION: Internal fixation across the left intertrochanteric fracture with anatomic alignment. Electronically Signed   By: Charlett Nose M.D.   On: 11/30/2016 11:35   Dg Hip Unilat With Pelvis 2-3 Views Left  Result Date: 11/29/2016 CLINICAL DATA:  Fall from standing.  Left hip pain. EXAM: DG HIP (WITH OR WITHOUT PELVIS) 2-3V LEFT COMPARISON:  None. FINDINGS: There is a comminuted intratrochanteric left proximal femoral fracture with a large butterfly fragment consisting of the lesser trochanter. The fracture is mildly impacted with no significant angulation. Soft tissue calcifications are seen. IMPRESSION: Comminuted impacted intertrochanteric left proximal femoral fracture. Electronically Signed   By: Ted Mcalpine M.D.   On: 11/29/2016 19:46        Scheduled Meds: . citalopram  40 mg Oral Daily  . docusate sodium  100 mg Oral BID  . feeding supplement (GLUCERNA SHAKE)  237 mL Oral BID BM  . insulin aspart  0-9 Units Subcutaneous Q4H  . insulin detemir  7 Units Subcutaneous BID  . methylPREDNISolone (SOLU-MEDROL) injection  60 mg Intravenous Q12H  . pantoprazole  80 mg Oral Daily  . pravastatin  20 mg Oral QPM  . Warfarin - Pharmacist Dosing Inpatient   Does not apply q1800   Continuous Infusions: . heparin 850 Units/hr (12/01/16 0600)     LOS: 2 days    Time spent:    Zannie Cove, MD [facility] Pager [phone]  If 7PM-7AM, please contact night-coverage [email or web address] Password [facility] 12/01/2016, 12:11 PM

## 2016-12-01 NOTE — Progress Notes (Signed)
Subjective: 1 Day Post-Op Procedure(s) (LRB): INTRAMEDULLARY (IM) NAIL LEFT HIP (Left) Patient reports pain as 8 on 0-10 scale.   Yells in pain then smile and comfortable. Objective: Vital signs in last 24 hours: Temp:  [97.7 F (36.5 C)-99.7 F (37.6 C)] 99.4 F (37.4 C) (01/13 0437) Pulse Rate:  [88-111] 111 (01/13 0437) Resp:  [12-18] 16 (01/13 0437) BP: (109-140)/(51-63) 123/63 (01/13 0437) SpO2:  [82 %-100 %] 98 % (01/13 0437)  Intake/Output from previous day: 01/12 0701 - 01/13 0700 In: 2822.5 [P.O.:1240; I.V.:1582.5] Out: 3450 [Urine:3350; Blood:100] Intake/Output this shift: No intake/output data recorded.   Recent Labs  11/29/16 2015 12/01/16 0537  HGB 10.3* 8.0*    Recent Labs  11/29/16 2015 12/01/16 0537  WBC 8.5 7.0  RBC 4.45 3.37*  HCT 33.0* 25.7*  PLT 338 260    Recent Labs  11/29/16 2015 12/01/16 0537  NA 132* 133*  K 4.3 4.3  CL 96* 100*  CO2 25 24  BUN 18 6  CREATININE 0.90 0.78  GLUCOSE 256* 206*  CALCIUM 9.2 8.6*    Recent Labs  11/29/16 2015 12/01/16 0537  INR 1.43 2.08    Sensation intact distally Intact pulses distally Dorsiflexion/Plantar flexion intact Incision: dressing C/D/I and no drainage Compartment soft Ankle: No obvious swelling nor deformity.Diffuse tenderness.       Xray of ankle neg for fx or acute problem. Old retained hdweAssessment/Plan: 1 Day Post-Op Procedure(s) (LRB): INTRAMEDULLARY (IM) NAIL LEFT HIP (Left) Up with therapy Will follow daily.  Questions  answered Sarkis Rhines ANDREW 12/01/2016, 9:55 AM

## 2016-12-01 NOTE — Care Management Note (Signed)
56 yo F s/p Lt hip fx with IM implant.   She resides at [facility]. D/C plan is for pt to return to facility. Contacted Bridgette, SW and left her a VM regarding D/C plan.

## 2016-12-01 NOTE — Clinical Social Work Note (Signed)
Clinical Social Work Assessment  Patient Details  Name: Madison [name] MRN: [id number] Date of Birth: 1961/04/10  Date of referral:  12/01/16               Reason for consult:  Facility Placement                Permission sought to share information with:    Permission granted to share information::     Name::        Agency::     Relationship::     Contact Information:     Housing/Transportation Living arrangements for the past 2 months:  Skilled Building surveyor of Information:  Patient Patient Interpreter Needed:  None Criminal Activity/Legal Involvement Pertinent to Current Situation/Hospitalization:  No - Comment as needed Significant Relationships:  None Lives with:  Facility Resident Do you feel safe going back to the place where you live?  Yes Need for family participation in patient care:     Care giving concerns:  No family or friends at bedside during initial assessment. Pt reports she has no family in the area.    Social Worker assessment / plan:  CSW spoke with pt at bedside. Pt was at [name] prior to admission. Pt's plan is to return to [name] once cleared for d/c. CSW will reach out to facility and follow up with pt.   Employment status:    Insurance informationAdvertising account executive PT Recommendations:  Skilled Nursing Facility Information / Referral to community resources:  Skilled Nursing Facility  Patient/Family's Response to care:  Pt verbalized understanding of CSW role and expression appreciation for support. Pt denies any concern for pt care at this time.   Patient/Family's Understanding of and Emotional Response to Diagnosis, Current Treatment, and Prognosis:  Pt understanding and realistic regarding physical limitations. Pt agreeable to return to SNF. Pt denies any concern regarding pt care at this time. CSW will continue to provide support.  Emotional Assessment Appearance:  Appears stated age Attitude/Demeanor/Rapport:   (Patient was  appropriate.) Affect (typically observed):  Accepting, Appropriate, Calm Orientation:  Oriented to Self, Oriented to Place, Oriented to  Time, Oriented to Situation Alcohol / Substance use:  Not Applicable Psych involvement (Current and /or in the community):  No (Comment)  Discharge Needs  Concerns to be addressed:  No discharge needs identified Readmission within the last 30 days:  No Current discharge risk:  Dependent with Mobility Barriers to Discharge:  Continued Medical Work up   [employer], LCSW 12/01/2016, 10:54 AM

## 2016-12-01 NOTE — NC FL2 (Signed)
[place] MEDICAID FL2 LEVEL OF CARE SCREENING TOOL     IDENTIFICATION  Patient Name: Madison [name] Birthdate: May 12, 1961 Sex: female Admission Date (Current Location): 11/29/2016  [facility] and [place] Number:  Producer, television/film/video and Address:  [facility], [address] [place], [place] [address]      Provider Number: [id number]  Attending Physician Name and Address:  Zannie Cove, MD  Relative Name and Phone Number:       Current Level of Care: Hospital Recommended Level of Care: Skilled Nursing Facility Prior Approval Number:    Date Approved/Denied:   PASRR Number: [id number]  Discharge Plan: SNF    Current Diagnoses: Patient Active Problem List   Diagnosis Date Noted  . Closed displaced intertrochanteric fracture of left femur (HCC) 11/29/2016  . Difficult airway for intubation 11/29/2016  . Type 2 diabetes mellitus with hyperosmolar nonketotic hyperglycemia (HCC) 10/21/2016  . Left ear pain 07/01/2016  . Hyperglycemia 06/06/2016  . Malnutrition of moderate degree 06/04/2016  . Septic shock (HCC) 06/04/2016  . SIRS (systemic inflammatory response syndrome) (HCC) 05/25/2016  . Type 1 diabetes mellitus with ketoacidosis without coma (HCC)   . MDD (major depressive disorder), recurrent episode, moderate (HCC)   . Aspiration pneumonia (HCC) 05/17/2016  . Diabetic ketoacidosis without coma (HCC)   . Sepsis (HCC)   . 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 MSOF (multiple systems organ failure) 05/01/2016  . Acute hypoxemic respiratory failure (HCC)   . Type I diabetes mellitus with complication, uncontrolled (HCC) 03/29/2016  . Adjustment disorder with mixed anxiety and depressed mood 03/28/2016  . HLD (hyperlipidemia) 03/27/2016  . AKI (acute kidney injury) (HCC) 03/27/2016  . Chronic combined systolic and diastolic congestive heart failure (HCC)   . Diabetic ketoacidosis (HCC) 03/26/2016  . Type 1 diabetes, HbA1c goal < 7%  (HCC)   . Hypotension 03/08/2016  . Uncontrolled type 1 diabetes mellitus with ketoacidotic coma (HCC)   . Acute encephalopathy 02/28/2016  . Hyponatremia: Pseudo 02/28/2016  . Lactic acidosis   . Diabetic ketoacidosis with coma associated with type 1 diabetes mellitus (HCC)   . Hypoglycemia   . Chronic diastolic heart failure (HCC)   . Esophageal reflux   . S/P AVR (aortic valve replacement)   . ARF (acute renal failure) (HCC) 01/22/2016  . CHF (congestive heart failure) (HCC) 01/22/2016  . Diabetic ketoacidosis without coma associated with type 1 diabetes mellitus (HCC)   . DKA, type 1 (HCC) 11/23/2015  . Constipation 11/23/2015  . Leukocytosis   . Pressure ulcer 08/02/2015  . Diabetic ketoacidosis with coma associated with other specified diabetes mellitus (HCC)   . Closed left ankle fracture 07/01/2015  . Trimalleolar fracture of left ankle 07/01/2015  . Hyperkalemia   . Insomnia 06/16/2015  . Normocytic anemia 01/31/2015  . Acute on chronic combined systolic and diastolic CHF (congestive heart failure) (HCC)   . Essential hypertension   . Elevated troponin   . Diabetes type 1, uncontrolled (HCC)   . Demand ischemia (HCC)   . S/P aortic valve replacement   . Hypokalemia   . NSTEMI (non-ST elevated myocardial infarction) (HCC) 12/08/2014  . Blood poisoning (HCC)   . Supratherapeutic INR   . Severe sepsis with acute organ dysfunction (HCC) 12/03/2014  . Spastic hemiplegia affecting nondominant side (HCC) 11/29/2014  . Dysphagia, pharyngoesophageal phase 10/04/2014  . Sinus tachycardia (HCC) 09/30/2014  . Depression 04/29/2014  . CVA (cerebral infarction) 03/12/2014  . Acute respiratory failure (HCC) 03/12/2014  .  Chronic anticoagulation 07/28/2013  . Long term (current) use of anticoagulants 07/28/2013  . CAD (coronary artery disease)   . Aortic stenosis   . Hyperlipidemia   . Rheumatoid arthritis (HCC)     Orientation RESPIRATION BLADDER Height & Weight     Self,  Time, Situation, Place  Normal Incontinent (At times) Weight: 146 lb (66.2 kg) Height:  5\' 3"  (160 cm)  BEHAVIORAL SYMPTOMS/MOOD NEUROLOGICAL BOWEL NUTRITION STATUS      Continent  (Please see discharge summary)  AMBULATORY STATUS COMMUNICATION OF NEEDS Skin   Extensive Assist Verbally Surgical wounds (Closed incision left hip; gauze dressing.)                       Personal Care Assistance Level of Assistance  Bathing, Feeding, Dressing Bathing Assistance: Maximum assistance Feeding assistance: Limited assistance Dressing Assistance: Maximum assistance     Functional Limitations Info  Sight, Hearing, Speech Sight Info: Adequate Hearing Info: Adequate Speech Info: Adequate    SPECIAL CARE FACTORS FREQUENCY  PT (By licensed PT), OT (By licensed OT)     PT Frequency: min 3x week OT Frequency: min 3x week            Contractures Contractures Info: Not present    Additional Factors Info  Allergies, Code Status Code Status Info: Full Allergies Info: Adhesive Tape, Celebrex Celecoxib, Detrol Tolterodine           Current Medications (12/01/2016):  This is the current hospital active medication list Current Facility-Administered Medications  Medication Dose Route Frequency Provider Last Rate Last Dose  . acetaminophen (TYLENOL) tablet 650 mg  650 mg Oral Q6H PRN 12/03/2016, MD   650 mg at 12/01/16 0430   Or  . acetaminophen (TYLENOL) suppository 650 mg  650 mg Rectal Q6H PRN 12/03/16, MD      . citalopram (CELEXA) tablet 40 mg  40 mg Oral Daily Yolonda Kida, DO   40 mg at 12/01/16 0931  . diphenhydrAMINE (BENADRYL) capsule 25 mg  25 mg Oral Q8H PRN Alexis Hugelmeyer, DO   25 mg at 11/30/16 2239  . docusate sodium (COLACE) capsule 100 mg  100 mg Oral BID 2240, DO   100 mg at 12/01/16 0931  . feeding supplement (GLUCERNA SHAKE) (GLUCERNA SHAKE) liquid 237 mL  237 mL Oral BID BM 12/03/16, MD   237 mL at 12/01/16 0931  . heparin  ADULT infusion 100 units/mL (25000 units/272mL sodium chloride 0.45%)  850 Units/hr Intravenous Continuous 45m, RPH 8.5 mL/hr at 12/01/16 0600 850 Units/hr at 12/01/16 0600  . HYDROcodone-acetaminophen (NORCO/VICODIN) 5-325 MG per tablet 1-2 tablet  1-2 tablet Oral Q6H PRN 12/03/16, DO   2 tablet at 12/01/16 0931  . HYDROmorphone (DILAUDID) injection 0.5 mg  0.5 mg Intravenous Q2H PRN 12/03/16, MD   0.5 mg at 11/30/16 2224  . insulin aspart (novoLOG) injection 0-9 Units  0-9 Units Subcutaneous Q4H 2225, DO   2 Units at 12/01/16 0423  . insulin detemir (LEVEMIR) injection 7 Units  7 Units Subcutaneous BID 12/03/16, DO   7 Units at 12/01/16 12/03/16  . morphine 4 MG/ML injection 0.52 mg  0.52 mg Intravenous Q2H PRN 9024, DO      . ondansetron [facility]) tablet 4 mg  4 mg Oral Q6H PRN [facility], MD       Or  . ondansetron [facility]) injection 4  mg  4 mg Intravenous Q6H PRN Yolonda Kida, MD      . pantoprazole (PROTONIX) EC tablet 80 mg  80 mg Oral Daily Hillary Bow, DO   80 mg at 12/01/16 0931  . pravastatin (PRAVACHOL) tablet 20 mg  20 mg Oral QPM Hillary Bow, DO   20 mg at 11/30/16 1854  . Warfarin - Pharmacist Dosing Inpatient   Does not apply q1800 Ann Held, [facility]         Discharge Medications: Please see discharge summary for a list of discharge medications.  Relevant Imaging Results:  Relevant Lab Results:   Additional Information SSN: [id number]  [employer], LCSW

## 2016-12-01 NOTE — Progress Notes (Signed)
ANTICOAGULATION CONSULT NOTE - Follow Up Consult  Pharmacy Consult for heparin Indication: AVR  Labs:  Recent Labs  11/29/16 2015 11/30/16 0546 12/01/16 0537 12/01/16 0538  HGB 10.3*  --  8.0*  --   HCT 33.0*  --  25.7*  --   PLT 338  --  260  --   LABPROT 17.5*  --   --   --   INR 1.43  --   --   --   HEPARINUNFRC  --  0.81*  --  0.23*  CREATININE 0.90  --  0.78  --     Assessment/Plan:  55yo female subtherapeutic on heparin after resuming though lab was drawn <6hr after started and likely needs more time to accumulate. Will continue gtt at current rate and check additional level.   Vernard Gambles, PharmD, BCPS  12/01/2016,7:20 AM

## 2016-12-01 NOTE — Progress Notes (Addendum)
ANTICOAGULATION CONSULT NOTE Pharmacy Consult for heparin and coumadin Indication: mechanical AVR  Patient Measurements: Height: 5\' 3"  (160 cm) Weight: 146 lb (66.2 kg) IBW/kg (Calculated) : 52.4 Heparin Dosing Weight: 66 kg  Vital Signs: Temp: 99.4 F (37.4 C) (01/13 0437) Temp Source: Oral (01/13 0437) BP: 123/63 (01/13 0437) Pulse Rate: 111 (01/13 0437)  Labs:  Recent Labs  11/29/16 2015 11/30/16 0546 12/01/16 0537 12/01/16 0538 12/01/16 0954  HGB 10.3*  --  8.0*  --   --   HCT 33.0*  --  25.7*  --   --   PLT 338  --  260  --   --   LABPROT 17.5*  --  23.7*  --   --   INR 1.43  --  2.08  --   --   HEPARINUNFRC  --  0.81*  --  0.23* 0.13*  CREATININE 0.90  --  0.78  --   --     Assessment: 55 YOM on warfarin PTA for hx CVA/mech AVR. Admiit INR was 1.43. The patient's warfarin was held on admission for a hip surgery by ortho done 1/12.  A Heparin bridge was started pre-op and was resume at midnight on 11/30/16. Warfarin resumed 11/30/16.  HL low at  0.13 on 850 units/hr- per RN IV beeped a lot this morning and IV re-taped. IV in her neck. Possible heparin not infusing well.  Hg 10.3>>8,- post op ABLA? PLTC WNL, no bleeding per RN . INR 2.08 from 1.43 - large jump after 10 mg coumadin dose.  goal 2.5-3.5    Noted PTA dose range at facility was 5 mg-10mg  and from old anticoag clinic note was 7.5 mg daily. Old dosing records in July show a rapid rise to therapeutic range after 10 mg x 3 days.   Goal of Therapy:  INR 2.5-3.5 Monitor platelets by anticoagulation protocol: Yes  Heparin level 0.3 - 0.7   Plan Bolus with 1500 units heparin and increase Heparin drip to 950 units/hr Warfarin 2 mg x 1 dose at 1800 today  Daily PT/INR, HL, CBC  Will continue to monitor for any signs/symptoms of bleeding and will follow up with heparin level in 6 hours after bolus - discussed with RN  August, Pharm.D. Herby Abraham 12/01/2016 12:22 PM

## 2016-12-02 DIAGNOSIS — E1065 Type 1 diabetes mellitus with hyperglycemia: Secondary | ICD-10-CM

## 2016-12-02 LAB — BASIC METABOLIC PANEL
Anion gap: 8 (ref 5–15)
BUN: 11 mg/dL (ref 6–20)
CALCIUM: 9.2 mg/dL (ref 8.9–10.3)
CHLORIDE: 99 mmol/L — AB (ref 101–111)
CO2: 27 mmol/L (ref 22–32)
Creatinine, Ser: 0.75 mg/dL (ref 0.44–1.00)
GFR calc non Af Amer: >60 mL/min (ref >60)
Glucose, Bld: 310 mg/dL — ABNORMAL HIGH (ref 65–99)
Potassium: 4.3 mmol/L (ref 3.5–5.1)
SODIUM: 134 mmol/L — AB (ref 135–145)

## 2016-12-02 LAB — GLUCOSE, CAPILLARY
Glucose-Capillary: 287 mg/dL — ABNORMAL HIGH (ref 65–99)
Glucose-Capillary: 290 mg/dL — ABNORMAL HIGH (ref 65–99)
Glucose-Capillary: 312 mg/dL — ABNORMAL HIGH (ref 65–99)
Glucose-Capillary: 374 mg/dL — ABNORMAL HIGH (ref 65–99)
Glucose-Capillary: 387 mg/dL — ABNORMAL HIGH (ref 65–99)
Glucose-Capillary: 452 mg/dL — ABNORMAL HIGH (ref 65–99)

## 2016-12-02 LAB — CBC
HCT: 25.8 % — ABNORMAL LOW (ref 36.0–46.0)
Hemoglobin: 8.1 g/dL — ABNORMAL LOW (ref 12.0–15.0)
MCH: 23.8 pg — AB (ref 26.0–34.0)
MCHC: 31.4 g/dL (ref 30.0–36.0)
MCV: 75.7 fL — AB (ref 78.0–100.0)
PLATELETS: 254 10*3/uL (ref 150–400)
RBC: 3.41 MIL/uL — ABNORMAL LOW (ref 3.87–5.11)
RDW: 17.2 % — AB (ref 11.5–15.5)
WBC: 10.2 10*3/uL (ref 4.0–10.5)

## 2016-12-02 LAB — HEPARIN LEVEL (UNFRACTIONATED): HEPARIN UNFRACTIONATED: 0.6 [IU]/mL (ref 0.30–0.70)

## 2016-12-02 LAB — PROTIME-INR
INR: 3.47
Prothrombin Time: 35.7 seconds — ABNORMAL HIGH (ref 11.4–15.2)

## 2016-12-02 MED ORDER — BISACODYL 5 MG PO TBEC
5.0000 mg | DELAYED_RELEASE_TABLET | Freq: Every day | ORAL | Status: DC | PRN
  Administered 2016-12-02 – 2016-12-03 (×2): 5 mg via ORAL
  Filled 2016-12-02 (×2): qty 1

## 2016-12-02 MED ORDER — INSULIN ASPART 100 UNIT/ML ~~LOC~~ SOLN
10.0000 [IU] | Freq: Once | SUBCUTANEOUS | Status: AC
  Administered 2016-12-02: 10 [IU] via SUBCUTANEOUS

## 2016-12-02 MED ORDER — MAGNESIUM CITRATE PO SOLN
1.0000 | Freq: Once | ORAL | Status: AC | PRN
  Administered 2016-12-04: 1 via ORAL
  Filled 2016-12-02: qty 296

## 2016-12-02 MED ORDER — WARFARIN SODIUM 1 MG PO TABS
1.0000 mg | ORAL_TABLET | Freq: Once | ORAL | Status: AC
  Administered 2016-12-02: 1 mg via ORAL
  Filled 2016-12-02: qty 1

## 2016-12-02 MED ORDER — PREDNISONE 20 MG PO TABS
40.0000 mg | ORAL_TABLET | Freq: Every day | ORAL | Status: AC
  Administered 2016-12-02 – 2016-12-03 (×2): 40 mg via ORAL
  Filled 2016-12-02 (×2): qty 2

## 2016-12-02 MED ORDER — INSULIN DETEMIR 100 UNIT/ML ~~LOC~~ SOLN
10.0000 [IU] | Freq: Two times a day (BID) | SUBCUTANEOUS | Status: DC
  Administered 2016-12-02: 10 [IU] via SUBCUTANEOUS
  Filled 2016-12-02 (×2): qty 0.1

## 2016-12-02 NOTE — Progress Notes (Signed)
ANTICOAGULATION CONSULT NOTE Pharmacy Consult for heparin and coumadin Indication: mechanical AVR  Patient Measurements: Height: 5\' 3"  (160 cm) Weight: 146 lb (66.2 kg) IBW/kg (Calculated) : 52.4 Heparin Dosing Weight: 66 kg  Vital Signs: Temp: 97.6 F (36.4 C) (01/14 0420) Temp Source: Oral (01/14 0420) BP: 130/61 (01/14 0420) Pulse Rate: 80 (01/14 0420)  Labs:  Recent Labs  11/29/16 2015  12/01/16 0537  12/01/16 0954 12/01/16 1836 12/02/16 0456  HGB 10.3*  --  8.0*  --   --   --  8.1*  HCT 33.0*  --  25.7*  --   --   --  25.8*  PLT 338  --  260  --   --   --  254  LABPROT 17.5*  --  23.7*  --   --   --  35.7*  INR 1.43  --  2.08  --   --   --  3.47  HEPARINUNFRC  --   < >  --   < > 0.13* 0.35 0.60  CREATININE 0.90  --  0.78  --   --   --  0.75  < > = values in this interval not displayed.  Assessment: 55 YOM on warfarin PTA for hx CVA/mech AVR. Admiit INR was 1.43. The patient's warfarin was held on admission for a hip surgery by ortho done 1/12.  A Heparin bridge was started pre-op and was resume at midnight on 11/30/16. Warfarin resumed 11/30/16.  HL therapeutic at 0.6 on 950 units/hr.  Hg 10.3>>8<8.1,- stable post op ABLA PLTC WNL, no bleeding per RN . INR 3.47 from 2.08 - large 12 sec jump in protime..  INR goal 2.5-3.5   Noted PTA dose range at facility was 5 mg-10mg  and from old anticoag clinic note was 7.5 mg daily. Old dosing records in July show a rapid rise to therapeutic range after 10 mg x 3 days.   Goal of Therapy:  INR 2.5-3.5 Monitor platelets by anticoagulation protocol: Yes  Heparin level 0.3 - 0.7   Plan Dc heparin drip Coumadin 1 mg x 1 dose at 1800 today  Daily PT/INR,   Will continue to monitor for any signs/symptoms of bleeding   August, Pharm.D. Herby Abraham 12/02/2016 10:41 AM

## 2016-12-02 NOTE — Progress Notes (Signed)
PROGRESS NOTE    ELNITA SURPRENANT  POE:[id number] DOB: 1961-06-11 DOA: 11/29/2016 PCP: [name], NP  Brief Narrative: Madison [name] is a 56 y.o. female with medical history significant of IDDM, mechanical AVR, stroke, CAD, diastolic grade 1 CHF on echo less than a year ago, no systolic dysfunction.  Patient presented to the ED  following a mechanical fall at her facility, Xray noted  hip intertroch fx. Ortho consulting  Assessment & Plan:   Principal Problem:  Closed displaced intertrochanteric fracture of left femur (HCC) -s/p Treatment of intertrochanteric, pertrochanteric, subtrochanteric fracture with intramedullary implant. By Dr.Rogers 1/12 -heparin/coumadin resumed, INR therapeutic now, heparin stopped -PT consulting -Plan SNF tomorrow if stable    Diabetes type 1, uncontrolled (HCC) -resumed levemir at 7units BID on 10BID at SNF -h/o very brittle DM -CBgs higher due to steroids, increase levemir and monitor   S/P AVR (aortic valve replacement) -on warfarin, INR 3 today   S/p CVA -anticoagulation as above  L ankle and great toe pain and swelling -suspect Gout, -if xray negative, improving with short course of steroids  Mild Cognitive dysfunction -now resides at SNF  Anxiety/Depression -continue celexa  CAD status post CABG  - denies any chest pain  DVT prophylaxis: warfarin Code Status:Full Code Family Communication:None at bedside Disposition Plan: Back to SNF in 1-2days  Consultants:   Ortho Dr.Rogers   Subjective: L ankle and great toe pain much improved  Objective: Vitals:   12/01/16 2000 12/01/16 2355 12/02/16 0400 12/02/16 0420  BP:  (!) 118/55  130/61  Pulse:  91  80  Resp:  16 16 16   Temp:  98.6 F (37 C)  97.6 F (36.4 C)  TempSrc:  Oral  Oral  SpO2: 92% 98%  95%  Weight:      Height:        Intake/Output Summary (Last 24 hours) at 12/02/16 1122 Last data filed at 12/02/16 0830  Gross per 24 hour  Intake           965.53 ml    Output              150 ml  Net           815.53 ml   Filed Weights   11/29/16 1824  Weight: 66.2 kg (146 lb)    Examination:  General exam: Appears calm and comfortable, AAOx3, mild distress Respiratory system: Clear to auscultation. Respiratory effort normal. Cardiovascular system: S1 & S2 heard, RRR. metallic click noted, . No pedal edema. Gastrointestinal system: Abdomen is nondistended, soft and nontender.  Normal bowel sounds heard. Central nervous system: Alert and oriented. No focal neurological deficits. Extremities: LUE chronically weak, L hip incision c/d/i  L ankle and great toe -much improved Skin: as above Psychiatry: Judgement and insight appear normal. Mood & affect appropriate.     Data Reviewed: I have personally reviewed following labs and imaging studies  CBC:  Recent Labs Lab 11/29/16 2015 12/01/16 0537 12/02/16 0456  WBC 8.5 7.0 10.2  NEUTROABS 6.1  --   --   HGB 10.3* 8.0* 8.1*  HCT 33.0* 25.7* 25.8*  MCV 74.2* 76.3* 75.7*  PLT 338 260 254   Basic Metabolic Panel:  Recent Labs Lab 11/29/16 2015 12/01/16 0537 12/02/16 0456  NA 132* 133* 134*  K 4.3 4.3 4.3  CL 96* 100* 99*  CO2 25 24 27   GLUCOSE 256* 206* 310*  BUN 18 6 11   CREATININE 0.90 0.78 0.75  CALCIUM 9.2 8.6*  9.2   GFR: Estimated Creatinine Clearance: 72.6 mL/min (by C-G formula based on SCr of 0.75 mg/dL). Liver Function Tests: No results for input(s): AST, ALT, ALKPHOS, BILITOT, PROT, ALBUMIN in the last 168 hours. No results for input(s): LIPASE, AMYLASE in the last 168 hours. No results for input(s): AMMONIA in the last 168 hours. Coagulation Profile:  Recent Labs Lab 11/29/16 2015 12/01/16 0537 12/02/16 0456  INR 1.43 2.08 3.47   Cardiac Enzymes: No results for input(s): CKTOTAL, CKMB, CKMBINDEX, TROPONINI in the last 168 hours. BNP (last 3 results) No results for input(s): PROBNP in the last 8760 hours. HbA1C: No results for input(s): HGBA1C in the last 72  hours. CBG:  Recent Labs Lab 12/01/16 1621 12/01/16 1937 12/01/16 2347 12/02/16 0423 12/02/16 0807  GLUCAP 264* 362* 297* 290* 287*   Lipid Profile: No results for input(s): CHOL, HDL, LDLCALC, TRIG, CHOLHDL, LDLDIRECT in the last 72 hours. Thyroid Function Tests: No results for input(s): TSH, T4TOTAL, FREET4, T3FREE, THYROIDAB in the last 72 hours. Anemia Panel: No results for input(s): VITAMINB12, FOLATE, FERRITIN, TIBC, IRON, RETICCTPCT in the last 72 hours. Urine analysis:    Component Value Date/Time   COLORURINE YELLOW 11/29/2016 2327   APPEARANCEUR HAZY (A) 11/29/2016 2327   LABSPEC 1.019 11/29/2016 2327   PHURINE 5.0 11/29/2016 2327   GLUCOSEU 150 (A) 11/29/2016 2327   HGBUR NEGATIVE 11/29/2016 2327   BILIRUBINUR NEGATIVE 11/29/2016 2327   BILIRUBINUR n 06/16/2015 1244   KETONESUR 20 (A) 11/29/2016 2327   PROTEINUR NEGATIVE 11/29/2016 2327   UROBILINOGEN 0.2 07/30/2015 1415   NITRITE NEGATIVE 11/29/2016 2327   LEUKOCYTESUR MODERATE (A) 11/29/2016 2327   Sepsis Labs: @LABRCNTIP (procalcitonin:4,lacticidven:4)  ) Recent Results (from the past 240 hour(s))  Surgical pcr screen     Status: None   Collection Time: 11/29/16 11:21 PM  Result Value Ref Range Status   MRSA, PCR NEGATIVE NEGATIVE Final   Staphylococcus aureus NEGATIVE NEGATIVE Final    Comment:        The Xpert SA Assay (FDA approved for NASAL specimens in patients over 56 years of age), is one component of a comprehensive surveillance program.  Test performance has been validated by [facility] for patients greater than or equal to 69 year old. It is not intended to diagnose infection nor to guide or monitor treatment.          Radiology Studies: Dg Ankle Complete Left  Result Date: 12/01/2016 CLINICAL DATA:  Recent fall and hip surgery, now with left ankle pain. Remote history of left ankle injury. EXAM: LEFT ANKLE COMPLETE - 3+ VIEW COMPARISON:  Intraoperative radiographs of the left  ankle -07/04/2015 FINDINGS: No fracture or dislocation. Post lag and cancellous screw fixation of the medial malleolus and sideplate fixation of the distal fibular without evidence of hardware failure or loosening. Joint spaces are preserved. The ankle mortise is preserved given obliquity. No ankle joint effusion. No plantar calcaneal spur. Regional soft tissues appear normal. IMPRESSION: 1. No acute findings. 2. Post ORIF of the medial and lateral malleoli without evidence of hardware failure loosening. Electronically Signed   By: Simonne Come M.D.   On: 12/01/2016 09:10   Dg C-arm 1-60 Min  Result Date: 11/30/2016 CLINICAL DATA:  Left femoral fracture, internal fixation EXAM: DG C-ARM 61-120 MIN; OPERATIVE LEFT HIP WITH PELVIS COMPARISON:  None. FINDINGS: Multiple intraoperative spot images demonstrate internal fixation across the left femoral intertrochanteric fracture. Anatomic alignment. No hardware or bony complicating feature. IMPRESSION: Internal fixation across the  left intertrochanteric fracture with anatomic alignment. Electronically Signed   By: Charlett Nose M.D.   On: 11/30/2016 11:35   Dg Hip Operative Unilat W Or W/o Pelvis Left  Result Date: 11/30/2016 CLINICAL DATA:  Left femoral fracture, internal fixation EXAM: DG C-ARM 61-120 MIN; OPERATIVE LEFT HIP WITH PELVIS COMPARISON:  None. FINDINGS: Multiple intraoperative spot images demonstrate internal fixation across the left femoral intertrochanteric fracture. Anatomic alignment. No hardware or bony complicating feature. IMPRESSION: Internal fixation across the left intertrochanteric fracture with anatomic alignment. Electronically Signed   By: Charlett Nose M.D.   On: 11/30/2016 11:35        Scheduled Meds: . citalopram  40 mg Oral Daily  . docusate sodium  100 mg Oral BID  . feeding supplement (GLUCERNA SHAKE)  237 mL Oral BID BM  . insulin aspart  0-9 Units Subcutaneous Q4H  . insulin detemir  7 Units Subcutaneous BID  .  pantoprazole  80 mg Oral Daily  . pravastatin  20 mg Oral QPM  . warfarin  1 mg Oral ONCE-1800  . Warfarin - Pharmacist Dosing Inpatient   Does not apply q1800   Continuous Infusions:    LOS: 3 days    Time spent:    Zannie Cove, MD [facility] Pager [phone]  If 7PM-7AM, please contact night-coverage [email or web address] Password [email or web address] 12/02/2016, 11:22 AM

## 2016-12-02 NOTE — Progress Notes (Signed)
Patient ID: Madison [name], female   DOB: [birth year], 56 y.o.   MRN: [id number] Subjective: 2 Days Post-Op Procedure(s) (LRB): INTRAMEDULLARY (IM) NAIL LEFT HIP (Left)    Patient apparently comfortable this am as she was sleeping.  No reported issues  Objective:   VITALS:   Vitals:   12/02/16 0400 12/02/16 0420  BP:  130/61  Pulse:  80  Resp: 16 16  Temp:  97.6 F (36.4 C)    Incision: dressing C/D/I  LABS  Recent Labs  11/29/16 2015 12/01/16 0537 12/02/16 0456  HGB 10.3* 8.0* 8.1*  HCT 33.0* 25.7* 25.8*  WBC 8.5 7.0 10.2  PLT 338 260 254     Recent Labs  11/29/16 2015 12/01/16 0537 12/02/16 0456  NA 132* 133* 134*  K 4.3 4.3 4.3  BUN 18 6 11   CREATININE 0.90 0.78 0.75  GLUCOSE 256* 206* 310*     Recent Labs  12/01/16 0537 12/02/16 0456  INR 2.08 3.47     Assessment/Plan: 2 Days Post-Op Procedure(s) (LRB): INTRAMEDULLARY (IM) NAIL LEFT HIP (Left)   Advance diet Up with therapy  Discharge plan pending medical stabilization Weight bearing, DVT prophylaxis per 12/04/16, MD

## 2016-12-03 ENCOUNTER — Encounter (HOSPITAL_COMMUNITY): Payer: Self-pay | Admitting: Orthopedic Surgery

## 2016-12-03 LAB — BASIC METABOLIC PANEL
Anion gap: 9 (ref 5–15)
Anion gap: 13 (ref 5–15)
BUN: 17 mg/dL (ref 6–20)
BUN: 19 mg/dL (ref 6–20)
CO2: 26 mmol/L (ref 22–32)
CO2: 23 mmol/L (ref 22–32)
CREATININE: 0.76 mg/dL (ref 0.44–1.00)
Calcium: 9 mg/dL (ref 8.9–10.3)
Calcium: 8.8 mg/dL — ABNORMAL LOW (ref 8.9–10.3)
Chloride: 100 mmol/L — ABNORMAL LOW (ref 101–111)
Chloride: 94 mmol/L — ABNORMAL LOW (ref 101–111)
Creatinine, Ser: 0.99 mg/dL (ref 0.44–1.00)
GFR calc Af Amer: >60 mL/min (ref >60)
GFR calc non Af Amer: >60 mL/min (ref >60)
Glucose, Bld: 252 mg/dL — ABNORMAL HIGH (ref 65–99)
Glucose, Bld: 423 mg/dL — ABNORMAL HIGH (ref 65–99)
POTASSIUM: 3.9 mmol/L (ref 3.5–5.1)
Potassium: 4.3 mmol/L (ref 3.5–5.1)
SODIUM: 135 mmol/L (ref 135–145)
Sodium: 130 mmol/L — ABNORMAL LOW (ref 135–145)

## 2016-12-03 LAB — PROTIME-INR
INR: 3.2
PROTHROMBIN TIME: 33.5 s — AB (ref 11.4–15.2)

## 2016-12-03 LAB — GLUCOSE, CAPILLARY
GLUCOSE-CAPILLARY: 275 mg/dL — AB (ref 65–99)
GLUCOSE-CAPILLARY: 494 mg/dL — AB (ref 65–99)
Glucose-Capillary: 247 mg/dL — ABNORMAL HIGH (ref 65–99)
Glucose-Capillary: 246 mg/dL — ABNORMAL HIGH (ref 65–99)
Glucose-Capillary: 425 mg/dL — ABNORMAL HIGH (ref 65–99)

## 2016-12-03 LAB — CBC
HCT: 23.3 % — ABNORMAL LOW (ref 36.0–46.0)
Hemoglobin: 7.2 g/dL — ABNORMAL LOW (ref 12.0–15.0)
MCH: 23.5 pg — AB (ref 26.0–34.0)
MCHC: 30.9 g/dL (ref 30.0–36.0)
MCV: 75.9 fL — ABNORMAL LOW (ref 78.0–100.0)
PLATELETS: 237 10*3/uL (ref 150–400)
RBC: 3.07 MIL/uL — ABNORMAL LOW (ref 3.87–5.11)
RDW: 17.6 % — ABNORMAL HIGH (ref 11.5–15.5)
WBC: 11.8 10*3/uL — ABNORMAL HIGH (ref 4.0–10.5)

## 2016-12-03 LAB — PREPARE RBC (CROSSMATCH)

## 2016-12-03 LAB — GLUCOSE, RANDOM: Glucose, Bld: 584 mg/dL (ref 65–99)

## 2016-12-03 MED ORDER — INSULIN ASPART 100 UNIT/ML ~~LOC~~ SOLN
8.0000 [IU] | Freq: Once | SUBCUTANEOUS | Status: AC
  Administered 2016-12-03: 8 [IU] via SUBCUTANEOUS

## 2016-12-03 MED ORDER — SODIUM CHLORIDE 0.9 % IV SOLN: Freq: Once | INTRAVENOUS | Status: DC

## 2016-12-03 MED ORDER — INSULIN DETEMIR 100 UNIT/ML ~~LOC~~ SOLN
13.0000 [IU] | Freq: Two times a day (BID) | SUBCUTANEOUS | Status: DC
  Administered 2016-12-03 (×2): 13 [IU] via SUBCUTANEOUS
  Filled 2016-12-03 (×4): qty 0.13

## 2016-12-03 MED ORDER — GLUCERNA SHAKE PO LIQD
237.0000 mL | Freq: Three times a day (TID) | ORAL | Status: DC
  Administered 2016-12-03 – 2016-12-04 (×3): 237 mL via ORAL

## 2016-12-03 MED ORDER — WARFARIN SODIUM 3 MG PO TABS
3.0000 mg | ORAL_TABLET | Freq: Once | ORAL | Status: AC
  Administered 2016-12-03: 3 mg via ORAL
  Filled 2016-12-03 (×3): qty 1

## 2016-12-03 NOTE — Progress Notes (Signed)
PROGRESS NOTE    Madison [name]  QIW:[id number] DOB: 1961/05/27 DOA: 11/29/2016 PCP: [name], NP  Brief Narrative: Madison [name] is a 56 y.o. female with medical history significant of IDDM, mechanical AVR, stroke, CAD, diastolic grade 1 CHF on echo less than a year ago, no systolic dysfunction.  Patient presented to the ED  following a mechanical fall at her facility, Xray noted  hip intertroch fx. Ortho consulting  Assessment & Plan:   Principal Problem:  Closed displaced intertrochanteric fracture of left femur (HCC) -s/p Treatment of intertrochanteric, pertrochanteric, subtrochanteric fracture with intramedullary implant. By Dr.Rogers 1/12 -heparin/coumadin resumed, INR therapeutic now, heparin stopped -PT consulting -Plan SNF tomorrow   Acute Blood loss anemia-post op -Hb down to 7.2, baseline was 10.3 on admission -will transfuse 1unit PRBC today    Diabetes type 1, uncontrolled (HCC) -resumed levemir at 7units BID on 10BID at SNF -h/o very brittle DM -CBgs higher due to steroids, increase levemir to 13units   S/P AVR (aortic valve replacement) -on warfarin, INR 3.2 today   S/p CVA -anticoagulation as above  L ankle and great toe GOut flare -xray negative, improving on prednisone last dose today  Mild Cognitive dysfunction -now resides at SNF  Anxiety/Depression -continue celexa  CAD status post CABG  - denies any chest pain  DVT prophylaxis: warfarin Code Status:Full Code Family Communication:None at bedside Disposition Plan: Back to SNF tomorrow if stable  Consultants:   Ortho Dr.Rogers   Subjective: L ankle and great toe pain much improved, doing better overall  Objective: Vitals:   12/02/16 0420 12/02/16 1417 12/02/16 2000 12/03/16 0414  BP: 130/61 (!) 103/42 (!) 114/47 131/72  Pulse: 80 79 90 84  Resp: 16 16 16 16   Temp: 97.6 F (36.4 C) 98.4 F (36.9 C) 99 F (37.2 C) 98.3 F (36.8 C)  TempSrc: Oral Oral Oral Oral  SpO2: 95% 97%  96% 100%  Weight:      Height:        Intake/Output Summary (Last 24 hours) at 12/03/16 1008 Last data filed at 12/02/16 2149  Gross per 24 hour  Intake              640 ml  Output                0 ml  Net              640 ml   Filed Weights   11/29/16 1824  Weight: 66.2 kg (146 lb)    Examination:  General exam: Appears calm and comfortable, AAOx3, no distress Respiratory system: Clear to auscultation. Respiratory effort normal. Cardiovascular system: S1 & S2 heard, RRR. metallic click noted, . No pedal edema. Gastrointestinal system: Abdomen is nondistended, soft and nontender.  Normal bowel sounds heard. Central nervous system: Alert and oriented. No focal neurological deficits. Extremities: LUE chronically weak, L hip incision c/d/i  L ankle and great toe -much improved Skin: as above Psychiatry: Judgement and insight appear normal. Mood & affect appropriate.     Data Reviewed: I have personally reviewed following labs and imaging studies  CBC:  Recent Labs Lab 11/29/16 2015 12/01/16 0537 12/02/16 0456 12/03/16 0350  WBC 8.5 7.0 10.2 11.8*  NEUTROABS 6.1  --   --   --   HGB 10.3* 8.0* 8.1* 7.2*  HCT 33.0* 25.7* 25.8* 23.3*  MCV 74.2* 76.3* 75.7* 75.9*  PLT 338 260 254 237   Basic Metabolic Panel:  Recent Labs Lab 11/29/16 2015  12/01/16 0537 12/02/16 0456 12/03/16 0350  NA 132* 133* 134* 135  K 4.3 4.3 4.3 3.9  CL 96* 100* 99* 100*  CO2 25 24 27 26   GLUCOSE 256* 206* 310* 252*  BUN 18 6 11 17   CREATININE 0.90 0.78 0.75 0.76  CALCIUM 9.2 8.6* 9.2 9.0   GFR: Estimated Creatinine Clearance: 72.6 mL/min (by C-G formula based on SCr of 0.76 mg/dL). Liver Function Tests: No results for input(s): AST, ALT, ALKPHOS, BILITOT, PROT, ALBUMIN in the last 168 hours. No results for input(s): LIPASE, AMYLASE in the last 168 hours. No results for input(s): AMMONIA in the last 168 hours. Coagulation Profile:  Recent Labs Lab 11/29/16 2015 12/01/16 0537  12/02/16 0456 12/03/16 0350  INR 1.43 2.08 3.47 3.20   Cardiac Enzymes: No results for input(s): CKTOTAL, CKMB, CKMBINDEX, TROPONINI in the last 168 hours. BNP (last 3 results) No results for input(s): PROBNP in the last 8760 hours. HbA1C: No results for input(s): HGBA1C in the last 72 hours. CBG:  Recent Labs Lab 12/02/16 1558 12/02/16 1958 12/02/16 2349 12/03/16 0412 12/03/16 0831  GLUCAP 312* 452* 374* 247* 275*   Lipid Profile: No results for input(s): CHOL, HDL, LDLCALC, TRIG, CHOLHDL, LDLDIRECT in the last 72 hours. Thyroid Function Tests: No results for input(s): TSH, T4TOTAL, FREET4, T3FREE, THYROIDAB in the last 72 hours. Anemia Panel: No results for input(s): VITAMINB12, FOLATE, FERRITIN, TIBC, IRON, RETICCTPCT in the last 72 hours. Urine analysis:    Component Value Date/Time   COLORURINE YELLOW 11/29/2016 2327   APPEARANCEUR HAZY (A) 11/29/2016 2327   LABSPEC 1.019 11/29/2016 2327   PHURINE 5.0 11/29/2016 2327   GLUCOSEU 150 (A) 11/29/2016 2327   HGBUR NEGATIVE 11/29/2016 2327   BILIRUBINUR NEGATIVE 11/29/2016 2327   BILIRUBINUR n 06/16/2015 1244   KETONESUR 20 (A) 11/29/2016 2327   PROTEINUR NEGATIVE 11/29/2016 2327   UROBILINOGEN 0.2 07/30/2015 1415   NITRITE NEGATIVE 11/29/2016 2327   LEUKOCYTESUR MODERATE (A) 11/29/2016 2327   Sepsis Labs: @LABRCNTIP (procalcitonin:4,lacticidven:4)  ) Recent Results (from the past 240 hour(s))  Surgical pcr screen     Status: None   Collection Time: 11/29/16 11:21 PM  Result Value Ref Range Status   MRSA, PCR NEGATIVE NEGATIVE Final   Staphylococcus aureus NEGATIVE NEGATIVE Final    Comment:        The Xpert SA Assay (FDA approved for NASAL specimens in patients over 27 years of age), is one component of a comprehensive surveillance program.  Test performance has been validated by [facility] for patients greater than or equal to 29 year old. It is not intended to diagnose infection nor to guide or  monitor treatment.          Radiology Studies: No results found.      Scheduled Meds: . sodium chloride   Intravenous Once  . citalopram  40 mg Oral Daily  . docusate sodium  100 mg Oral BID  . feeding supplement (GLUCERNA SHAKE)  237 mL Oral BID BM  . insulin aspart  0-9 Units Subcutaneous Q4H  . insulin detemir  13 Units Subcutaneous BID  . pantoprazole  80 mg Oral Daily  . pravastatin  20 mg Oral QPM  . Warfarin - Pharmacist Dosing Inpatient   Does not apply q1800   Continuous Infusions:    LOS: 4 days    Time spent: 36    [facility], MD [facility] Pager [phone]  If 7PM-7AM, please contact night-coverage [email or web address] Password [email or web address] 12/03/2016, 10:08 AM

## 2016-12-03 NOTE — Progress Notes (Signed)
Nutrition Follow-up  DOCUMENTATION CODES:   Not applicable  INTERVENTION:  Provide Glucerna Shake po TID, each supplement provides 220 kcal and 10 grams of protein.  Encourage adequate PO intake.   NUTRITION DIAGNOSIS:   Increased nutrient needs related to  (post op healing) as evidenced by estimated needs; ongoing  GOAL:   Patient will meet greater than or equal to 90% of their needs; progressing  MONITOR:   PO intake, Supplement acceptance, Labs, Weight trends, Skin, I & O's  REASON FOR ASSESSMENT:   Consult Hip fracture protocol  ASSESSMENT:   56 y.o. female with medical history significant of IDDM, mechanical AVR, stroke, CAD, diastolic grade 1 CHF on echo less than a year ago, no systolic dysfunction.  Patient presented to the ED  following a mechanical fall at her facility, Xray noted  hip intertroch fx.  Meal completion 50%. Pt reports having a good appetite currently and PTA with usual consumption of at least 3 meals with no other difficulties. Pt currently has Glucerna Shake ordered and has been consuming them. RD to increase Glucerna to TID to aid in caloric and protein needs. Pt encouraged to eat her foods at meals and to drink her supplements. Limited nutrition focused physical exam performed as pt was preoccupied consuming her lunch tray. Pt with no observed significant fat or muscle mass loss.   Labs and medications reviewed. CBG 247-452 mg/dL.  Diet Order:  Diet Carb Modified Fluid consistency: Thin; Room service appropriate? Yes  Skin:   (Incision L hip)  Last BM:  1/11  Height:   Ht Readings from Last 1 Encounters:  11/29/16 5\' 3"  (1.6 m)    Weight:   Wt Readings from Last 1 Encounters:  11/29/16 146 lb (66.2 kg)    Ideal Body Weight:  52.27 kg  BMI:  Body mass index is 25.86 kg/m.  Estimated Nutritional Needs:   Kcal:  1800-2000  Protein:  80-90 grams  Fluid:  1.8 - 2 L/day  EDUCATION NEEDS:   No education needs identified at this  time  01/27/17, MS, RD, LDN Pager # [phone] After hours/ weekend pager # [phone]

## 2016-12-03 NOTE — Clinical Social Work Note (Signed)
[facility] called requesting updated clinicals. CSW sent clinicals, per worker pt should have auth by Am.  Pending reference #: [id number]  Jamey Reas, LCSWA [phone]

## 2016-12-03 NOTE — Progress Notes (Signed)
ANTICOAGULATION CONSULT NOTE Pharmacy Consult for heparin and coumadin Indication: mechanical AVR  Patient Measurements: Height: 5\' 3"  (160 cm) Weight: 146 lb (66.2 kg) IBW/kg (Calculated) : 52.4 Heparin Dosing Weight: 66 kg  Vital Signs: Temp: 98.3 F (36.8 C) (01/15 0414) Temp Source: Oral (01/15 0414) BP: 131/72 (01/15 0414) Pulse Rate: 84 (01/15 0414)  Labs:  Recent Labs  12/01/16 0537  12/01/16 0954 12/01/16 1836 12/02/16 0456 12/03/16 0350  HGB 8.0*  --   --   --  8.1* 7.2*  HCT 25.7*  --   --   --  25.8* 23.3*  PLT 260  --   --   --  254 237  LABPROT 23.7*  --   --   --  35.7* 33.5*  INR 2.08  --   --   --  3.47 3.20  HEPARINUNFRC  --   < > 0.13* 0.35 0.60  --   CREATININE 0.78  --   --   --  0.75 0.76  < > = values in this interval not displayed.  Assessment: 26 YOF admitted s/p mechanical fall  PMH: IDDM, mAVR on warfarin, hx of stroke, CAD, CHF  Anticoag: warfarin PTA for mAVR INR goal 2.5 - 3.5 INR now 3.2  * Old clinic notes show dose of 7.5 mg daily  Nephro: SCr 0.76  Heme/Onc: H&H 7.2/23.3, Plt 237  Goal of Therapy:  INR 2.5-3.5 Monitor platelets by anticoagulation protocol: Yes  Heparin level 0.3 - 0.7   Plan Warfarin 3 mg x 1 Daily INR, CBC q72h  06-19-1971, PharmD, BCPS, BCCCP Clinical Pharmacist Clinical phone for 12/03/2016 from 7a-3:30p: 12/05/2016 If after 3:30p, please call main pharmacy at: [phone] 12/03/2016 12:25 PM

## 2016-12-03 NOTE — Progress Notes (Signed)
Dr. Jamse Belfast called and gave order for 10 units of novolog subcutaneous  now

## 2016-12-03 NOTE — Progress Notes (Signed)
Physical Therapy Treatment Patient Details Name: Madison [name] MRN: [id number] DOB: Oct 07, 1961 Today's Date: 12/03/2016    History of Present Illness Pt is a 56 y.o. female now s/p Lt hip fx with IM implant. PMH: stroke, RA, MI, HTN, diabetes, CAD, CHF.     PT Comments    Patient is making progress toward mobility goals and tolerated short gait distance this session. Max A for transfers and ambulation and +2 for assistance/safety at times during session. Continue to progress as tolerated with anticipated d/c to SNF for further skilled PT services.    Follow Up Recommendations  SNF     Equipment Recommendations  Other (comment) (determined next venue)    Recommendations for Other Services       Precautions / Restrictions Precautions Precautions: Fall Restrictions Weight Bearing Restrictions: Yes LLE Weight Bearing: Weight bearing as tolerated    Mobility  Bed Mobility               General bed mobility comments: pt OOB in chair upon arrival  Transfers Overall transfer level: Needs assistance   Transfers: Sit to/from Stand Sit to Stand: Max assist;+2 physical assistance         General transfer comment: assist to power up into standing with cues for hand placement and L LE blocked  Ambulation/Gait Ambulation/Gait assistance: Max assist;+2 safety/equipment Ambulation Distance (Feet): 10 Feet Assistive device: Hemi-walker Gait Pattern/deviations: Step-to pattern;Decreased stance time - left;Decreased step length - right;Decreased weight shift to left;Narrow base of support     General Gait Details: cues for posture, sequencing of gait with use of AD, and for safe step lengths; pt maintained L LE in extension and therapist blocked foot to keep it from sliding forward with R step; assist for balance and weight shifting as well as management of R hemi walker and chair follow   Stairs            Wheelchair Mobility    Modified Rankin (Stroke Patients  Only)       Balance Overall balance assessment: Needs assistance Sitting-balance support: Single extremity supported Sitting balance-Leahy Scale: Fair     Standing balance support: Single extremity supported Standing balance-Leahy Scale: Poor                      Cognition Arousal/Alertness: Awake/alert Behavior During Therapy: WFL for tasks assessed/performed Overall Cognitive Status: Within Functional Limits for tasks assessed                      Exercises Total Joint Exercises Long Arc Quad: AROM;Left;15 reps;Seated    General Comments        Pertinent Vitals/Pain Pain Assessment: 0-10 Pain Score: 8  Pain Location: L hip (L hip with movement) Pain Descriptors / Indicators: Aching;Guarding;Sore Pain Intervention(s): Limited activity within patient's tolerance;Monitored during session;Repositioned;RN gave pain meds during session    Home Living                      Prior Function            PT Goals (current goals can now be found in the care plan section) Acute Rehab PT Goals Patient Stated Goal: be able to walk again Progress towards PT goals: Progressing toward goals    Frequency    Min 3X/week      PT Plan Current plan remains appropriate    Co-evaluation  End of Session Equipment Utilized During Treatment: Gait belt Activity Tolerance: Patient tolerated treatment well Patient left: in chair;with call bell/phone within reach;with nursing/sitter in room;with chair alarm set     Time: 1975-8832 PT Time Calculation (min) (ACUTE ONLY): 31 min  Charges:  $Gait Training: 8-22 mins $Therapeutic Activity: 8-22 mins                    G Codes:      Derek Mound, PTA Pager: [phone]   12/03/2016, 1:13 PM

## 2016-12-03 NOTE — Care Management Important Message (Signed)
Important Message  Patient Details  Name: MARCELIA PETERSEN MRN: [id number] Date of Birth: 27-Feb-1961   Medicare Important Message Given:  Yes    Jeanice Dempsey 12/03/2016, 4:45 PM

## 2016-12-03 NOTE — Progress Notes (Signed)
Inpatient Diabetes Program Recommendations  AACE/ADA: New Consensus Statement on Inpatient Glycemic Control (2015)  Target Ranges:  Prepandial:   less than 140 mg/dL      Peak postprandial:   less than 180 mg/dL (1-2 hours)      Critically ill patients:  140 - 180 mg/dL   Lab Results  Component Value Date   GLUCAP 246 (H) 12/03/2016   HGBA1C 13.0 (H) 10/22/2016    Review of Glycemic Control  Blood sugars > 180 mg/dL. Needs insulin adjustment. Agree with increase in Lantus.  Inpatient Diabetes Program Recommendations:    Increase Novolog to 0-15 units tidwc and hs.  Will continue to follow.  Thank you. Ailene Ards, RD, LDN, CDE Inpatient Diabetes Coordinator [phone]

## 2016-12-03 NOTE — Progress Notes (Signed)
Blood finished infusing at 1515 and VS taken at that time. No reaction noted. Pt tolerated well.

## 2016-12-04 DIAGNOSIS — R278 Other lack of coordination: Secondary | ICD-10-CM | POA: Diagnosis not present

## 2016-12-04 DIAGNOSIS — Z79899 Other long term (current) drug therapy: Secondary | ICD-10-CM | POA: Diagnosis not present

## 2016-12-04 DIAGNOSIS — I10 Essential (primary) hypertension: Secondary | ICD-10-CM | POA: Diagnosis not present

## 2016-12-04 DIAGNOSIS — E1010 Type 1 diabetes mellitus with ketoacidosis without coma: Secondary | ICD-10-CM | POA: Diagnosis not present

## 2016-12-04 DIAGNOSIS — S72143D Displaced intertrochanteric fracture of unspecified femur, subsequent encounter for closed fracture with routine healing: Secondary | ICD-10-CM | POA: Diagnosis not present

## 2016-12-04 DIAGNOSIS — K219 Gastro-esophageal reflux disease without esophagitis: Secondary | ICD-10-CM | POA: Diagnosis not present

## 2016-12-04 DIAGNOSIS — R2689 Other abnormalities of gait and mobility: Secondary | ICD-10-CM | POA: Diagnosis not present

## 2016-12-04 DIAGNOSIS — G4709 Other insomnia: Secondary | ICD-10-CM | POA: Diagnosis not present

## 2016-12-04 DIAGNOSIS — Z4789 Encounter for other orthopedic aftercare: Secondary | ICD-10-CM | POA: Diagnosis not present

## 2016-12-04 DIAGNOSIS — E1069 Type 1 diabetes mellitus with other specified complication: Secondary | ICD-10-CM | POA: Diagnosis not present

## 2016-12-04 DIAGNOSIS — R0989 Other specified symptoms and signs involving the circulatory and respiratory systems: Secondary | ICD-10-CM | POA: Diagnosis not present

## 2016-12-04 DIAGNOSIS — R05 Cough: Secondary | ICD-10-CM | POA: Diagnosis not present

## 2016-12-04 DIAGNOSIS — K769 Liver disease, unspecified: Secondary | ICD-10-CM | POA: Diagnosis not present

## 2016-12-04 DIAGNOSIS — I5042 Chronic combined systolic (congestive) and diastolic (congestive) heart failure: Secondary | ICD-10-CM | POA: Diagnosis not present

## 2016-12-04 DIAGNOSIS — E1065 Type 1 diabetes mellitus with hyperglycemia: Secondary | ICD-10-CM | POA: Diagnosis not present

## 2016-12-04 DIAGNOSIS — R531 Weakness: Secondary | ICD-10-CM | POA: Diagnosis not present

## 2016-12-04 DIAGNOSIS — I5032 Chronic diastolic (congestive) heart failure: Secondary | ICD-10-CM | POA: Diagnosis not present

## 2016-12-04 DIAGNOSIS — M6281 Muscle weakness (generalized): Secondary | ICD-10-CM | POA: Diagnosis not present

## 2016-12-04 DIAGNOSIS — E785 Hyperlipidemia, unspecified: Secondary | ICD-10-CM | POA: Diagnosis not present

## 2016-12-04 DIAGNOSIS — D508 Other iron deficiency anemias: Secondary | ICD-10-CM | POA: Diagnosis not present

## 2016-12-04 DIAGNOSIS — E1100 Type 2 diabetes mellitus with hyperosmolarity without nonketotic hyperglycemic-hyperosmolar coma (NKHHC): Secondary | ICD-10-CM | POA: Diagnosis not present

## 2016-12-04 DIAGNOSIS — S72142D Displaced intertrochanteric fracture of left femur, subsequent encounter for closed fracture with routine healing: Secondary | ICD-10-CM | POA: Diagnosis not present

## 2016-12-04 DIAGNOSIS — F418 Other specified anxiety disorders: Secondary | ICD-10-CM | POA: Diagnosis not present

## 2016-12-04 DIAGNOSIS — E784 Other hyperlipidemia: Secondary | ICD-10-CM | POA: Diagnosis not present

## 2016-12-04 DIAGNOSIS — E108 Type 1 diabetes mellitus with unspecified complications: Secondary | ICD-10-CM | POA: Diagnosis not present

## 2016-12-04 DIAGNOSIS — E441 Mild protein-calorie malnutrition: Secondary | ICD-10-CM | POA: Diagnosis not present

## 2016-12-04 DIAGNOSIS — E039 Hypothyroidism, unspecified: Secondary | ICD-10-CM | POA: Diagnosis not present

## 2016-12-04 DIAGNOSIS — D649 Anemia, unspecified: Secondary | ICD-10-CM | POA: Diagnosis not present

## 2016-12-04 DIAGNOSIS — Z5181 Encounter for therapeutic drug level monitoring: Secondary | ICD-10-CM | POA: Diagnosis not present

## 2016-12-04 DIAGNOSIS — N179 Acute kidney failure, unspecified: Secondary | ICD-10-CM | POA: Diagnosis not present

## 2016-12-04 DIAGNOSIS — Z952 Presence of prosthetic heart valve: Secondary | ICD-10-CM | POA: Diagnosis not present

## 2016-12-04 DIAGNOSIS — S72009A Fracture of unspecified part of neck of unspecified femur, initial encounter for closed fracture: Secondary | ICD-10-CM | POA: Diagnosis not present

## 2016-12-04 DIAGNOSIS — E559 Vitamin D deficiency, unspecified: Secondary | ICD-10-CM | POA: Diagnosis not present

## 2016-12-04 LAB — GLUCOSE, CAPILLARY
GLUCOSE-CAPILLARY: 101 mg/dL — AB (ref 65–99)
GLUCOSE-CAPILLARY: 85 mg/dL (ref 65–99)
GLUCOSE-CAPILLARY: 256 mg/dL — AB (ref 65–99)
Glucose-Capillary: 282 mg/dL — ABNORMAL HIGH (ref 65–99)

## 2016-12-04 LAB — TYPE AND SCREEN
ABO/RH(D): A POS
ANTIBODY SCREEN: NEGATIVE
Unit division: 0

## 2016-12-04 LAB — PROTIME-INR
INR: 2.3
PROTHROMBIN TIME: 25.7 s — AB (ref 11.4–15.2)

## 2016-12-04 LAB — HEMOGLOBIN AND HEMATOCRIT, BLOOD
HCT: 30.7 % — ABNORMAL LOW (ref 36.0–46.0)
Hemoglobin: 9.9 g/dL — ABNORMAL LOW (ref 12.0–15.0)

## 2016-12-04 MED ORDER — INSULIN DETEMIR 100 UNIT/ML ~~LOC~~ SOLN: 10.0000 [IU] | Freq: Two times a day (BID) | SUBCUTANEOUS | Status: DC

## 2016-12-04 MED ORDER — WARFARIN SODIUM 2 MG PO TABS: 2.0000 mg | ORAL_TABLET | Freq: Once | ORAL | 0 refills | Status: DC

## 2016-12-04 MED ORDER — INSULIN DETEMIR 100 UNIT/ML ~~LOC~~ SOLN
8.0000 [IU] | Freq: Two times a day (BID) | SUBCUTANEOUS | Status: DC
  Administered 2016-12-04: 8 [IU] via SUBCUTANEOUS
  Filled 2016-12-04: qty 0.08

## 2016-12-04 MED ORDER — HYDROCODONE-ACETAMINOPHEN 5-325 MG PO TABS: 1.0000 | ORAL_TABLET | Freq: Four times a day (QID) | ORAL | 0 refills | Status: DC | PRN

## 2016-12-04 MED ORDER — ACETAMINOPHEN 325 MG PO TABS: 650.0000 mg | ORAL_TABLET | Freq: Four times a day (QID) | ORAL | Status: AC | PRN

## 2016-12-04 NOTE — Clinical Social Work Placement (Signed)
   CLINICAL SOCIAL WORK PLACEMENT  NOTE  Date:  12/04/2016  Patient Details  Name: Madison [name] MRN: [id number] Date of Birth: [birth year]  Clinical Social Work is seeking post-discharge placement for this patient at the Skilled  Nursing Facility level of care (*CSW will initial, date and re-position this form in  chart as items are completed):      Patient/family provided with [facility] Clinical Social Work Department's list of facilities offering this level of care within the geographic area requested by the patient (or if unable, by the patient's family).  Yes   Patient/family informed of their freedom to choose among providers that offer the needed level of care, that participate in Medicare, Medicaid or managed care program needed by the patient, have an available bed and are willing to accept the patient.      Patient/family informed of [facility] ownership interest in [facility] and [facility], as well as of the fact that they are under no obligation to receive care at these facilities.  PASRR submitted to EDS on       PASRR number received on       Existing PASRR number confirmed on 12/01/16     FL2 transmitted to all facilities in geographic area requested by pt/family on 12/01/16     FL2 transmitted to all facilities within larger geographic area on       Patient informed that his/her managed care company has contracts with or will negotiate with certain facilities, including the following:        Yes   Patient/family informed of bed offers received.  Patient chooses bed at [facility]     Physician recommends and patient chooses bed at      Patient to be transferred to [facility] on 12/04/16.  Patient to be transferred to facility by [facility]     Patient family notified on 12/04/16 of transfer.  Name of family member notified:        PHYSICIAN Please prepare priority discharge summary, including medications, Please  prepare prescriptions     Additional Comment:    _______________________________________________ Volney American, LCSW 12/04/2016, 9:02 AM

## 2016-12-04 NOTE — Progress Notes (Signed)
Reviewed discharge information/medications with patient.  Answered her questions.  Patient is ready for discharge.  Patient is waiting on transport to pick her up.  Attempted to call report to [name].  They placed me on hold for 5 minutes and no one picked up.  Will try calling back.

## 2016-12-04 NOTE — Clinical Social Work Note (Signed)
Clinical Social Worker facilitated patient discharge including contacting patient family and facility to confirm patient discharge plans.  Clinical information faxed to facility and family agreeable with plan.  CSW arranged ambulance transport via [facility] to [name].  RN to call [phone] for report prior to discharge.  Clinical Social Worker will sign off for now as social work intervention is no longer needed. Please consult us again if new need arises.  Beck Cofer [place], LCSWA [phone]   

## 2016-12-04 NOTE — Clinical Social Work Note (Deleted)
CSW has received insurance authorization at this time.  Authorization number: [id number]  Jamey Reas, LCSWA [phone]

## 2016-12-04 NOTE — Discharge Summary (Signed)
Physician Discharge Summary  [name] LKJ:[id number] DOB: [birth year] DOA: 11/29/2016  PCP: [name], NP  Admit date: 11/29/2016 Discharge date: 12/04/2016  Time spent: 45 minutes  Recommendations for Outpatient Follow-up:  1. PCP in 1 week, very Brittle DM, titrate Levemir dose slowly as needed 2. Ortho Dr.Rogers in 2weeks 3. Monitor INR, on chronic coumadin  Discharge Diagnoses:  Principal Problem:   Closed displaced intertrochanteric fracture of left femur (HCC) Active Problems:   Diabetes type 1, uncontrolled (HCC)/Very Brittle DM   S/P mechanical AVR (aortic valve replacement)   Difficult airway for intubation   Acute blood loss anemia   Mild cognitive dysfunction   CAD s/p CABG  Discharge Condition: stable  Diet recommendation: Diabetic  Filed Weights   11/29/16 1824  Weight: 66.2 kg (146 lb)    History of present illness:  Madison [name] a 56 y.o.femalewith medical history significant of IDDM, mechanical AVR, stroke, CAD, diastolic grade 1 CHF on echo less than a year ago, no systolic dysfunction. Patient presented to the ED  following a mechanical fall at her facility, Xray noted  hip intertroch fx  Hospital Course:  Closed displaced intertrochanteric fracture of left femur (HCC) -s/p mechanical fall -s/p Treatment of intertrochanteric, pertrochanteric, subtrochanteric fracture with intramedullary implant. By Dr.Rogers 1/12 -heparin/coumadin resumed, INR therapeutic now, heparin stopped -stable post op, plan for discharge back to SNF today -Continue Physical therapy and FU with Dr.Rogers   Acute Blood loss anemia-post op -Hb down to 7.2 on 1/15 and transfused 1unit of PRBC    Diabetes type 1, uncontrolled (HCC) -resumed home dose Levemir 10units BID -h/o very brittle DM   S/P mechanical AVR (aortic valve replacement) -on warfarin, INR 2.3 today -goal INR 2.5-3.5, recheck INR on 1/17   S/p CVA -anticoagulation as above  L ankle and  great toe GOut flare -xray negative, improved on prednisone, completed last dose  Mild Cognitive dysfunction -now resides at SNF  Anxiety/Depression -continue celexa  CAD status post CABG  - denies any chest pain  Consultations:  Ortho dr.Rogers  Discharge Exam: Vitals:   12/03/16 2300 12/04/16 0500  BP: (!) 128/58 121/74  Pulse: 83 80  Resp: 18 16  Temp: 98.3 F (36.8 C) 98.5 F (36.9 C)    General: AAOx3 Cardiovascular: S1S2/RRR Respiratory: CTAB  Discharge Instructions   Discharge Instructions    Diet - low sodium heart healthy    Complete by:  As directed    Diet Carb Modified    Complete by:  As directed    Increase activity slowly    Complete by:  As directed      Current Discharge Medication List    START taking these medications   Details  acetaminophen (TYLENOL) 325 MG tablet Take 2 tablets (650 mg total) by mouth every 6 (six) hours as needed for mild pain (or Fever >/= 101).    HYDROcodone-acetaminophen (NORCO/VICODIN) 5-325 MG tablet Take 1-2 tablets by mouth every 6 (six) hours as needed for moderate pain. Qty: 30 tablet, Refills: 0      CONTINUE these medications which have CHANGED   Details  insulin detemir (LEVEMIR) 100 UNIT/ML injection Inject 0.1 mLs (10 Units total) into the skin 2 (two) times daily.    warfarin (COUMADIN) 2 MG tablet Take 1 tablet (2 mg total) by mouth one time only at 6 PM. For INR 2-3, recheck INR on 1/18 Refills: 0      CONTINUE these medications which have NOT CHANGED  Details  citalopram (CELEXA) 40 MG tablet Take 1 tablet (40 mg total) by mouth daily. Qty: 90 tablet, Refills: 3   Associated Diagnoses: Depression    docusate sodium (COLACE) 100 MG capsule Take 100 mg by mouth 2 (two) times daily.    ergocalciferol (VITAMIN D2) 50000 units capsule Take 50,000 Units by mouth every Wednesday.     guaiFENesin-dextromethorphan (ROBITUSSIN DM) 100-10 MG/5ML syrup Take 15 mLs by mouth every 4 (four) hours as  needed for cough.    insulin aspart (NOVOLOG) 100 UNIT/ML injection Inject 3 Units into the skin 3 (three) times daily with meals. Qty: 10 mL, Refills: 11    Nutritional Supplements (NUTRITIONAL DRINK PO) Take 60 mLs by mouth 3 (three) times daily. MEDPASS 1.7    omeprazole (PRILOSEC) 40 MG capsule Take 40 mg by mouth daily before breakfast.    pravastatin (PRAVACHOL) 20 MG tablet Take 20 mg by mouth every evening.    traMADol (ULTRAM) 50 MG tablet Take 1 tablet (50 mg total) by mouth every 6 (six) hours as needed for moderate pain or severe pain. Qty: 10 tablet, Refills: 0    zolpidem (AMBIEN) 5 MG tablet Take 5 mg by mouth at bedtime. FOR SLEEP      STOP taking these medications     Acetaminophen (CHLORASEPTIC SORE THROAT PO)        Allergies  Allergen Reactions  . Adhesive [Tape] Other (See Comments)    Reaction:  Burning   . Celebrex [Celecoxib] Rash  . Detrol [Tolterodine] Hives   Follow-up Information    Yolonda Kida, MD. Schedule an appointment as soon as possible for a visit in 2 week(s).   Specialty:  Orthopedic Surgery Contact information: [address] [place] [address] [phone]        [name], NP. Schedule an appointment as soon as possible for a visit in 1 week(s).   Specialty:  Family Medicine Contact information: [address] [place] [address] [phone]            The results of significant diagnostics from this hospitalization (including imaging, microbiology, ancillary and laboratory) are listed below for reference.    Significant Diagnostic Studies: Dg Chest 2 View  Result Date: 11/29/2016 CLINICAL DATA:  Fall from standing EXAM: CHEST  2 VIEW COMPARISON:  10/21/2016 FINDINGS: Post sternotomy changes. No acute infiltrate or effusion. Stable cardiomediastinal silhouette. No pneumothorax. IMPRESSION: No active cardiopulmonary disease. Electronically Signed   By: Jasmine Pang M.D.   On:  11/29/2016 19:46   Dg Ankle Complete Left  Result Date: 12/01/2016 CLINICAL DATA:  Recent fall and hip surgery, now with left ankle pain. Remote history of left ankle injury. EXAM: LEFT ANKLE COMPLETE - 3+ VIEW COMPARISON:  Intraoperative radiographs of the left ankle -07/04/2015 FINDINGS: No fracture or dislocation. Post lag and cancellous screw fixation of the medial malleolus and sideplate fixation of the distal fibular without evidence of hardware failure or loosening. Joint spaces are preserved. The ankle mortise is preserved given obliquity. No ankle joint effusion. No plantar calcaneal spur. Regional soft tissues appear normal. IMPRESSION: 1. No acute findings. 2. Post ORIF of the medial and lateral malleoli without evidence of hardware failure loosening. Electronically Signed   By: Simonne Come M.D.   On: 12/01/2016 09:10   Dg Knee Left Port  Result Date: 11/29/2016 CLINICAL DATA:  Left hip fracture. EXAM: PORTABLE LEFT KNEE - 1-2 VIEW COMPARISON:  None. FINDINGS: No evidence of fracture, dislocation, or  joint effusion. No evidence of arthropathy or other focal bone abnormality. Vascular calcifications noted. Soft tissues are otherwise unremarkable. IMPRESSION: No acute fracture or dislocation identified about the left knee. Electronically Signed   By: Ted Mcalpine M.D.   On: 11/29/2016 21:10   Dg C-arm 1-60 Min  Result Date: 11/30/2016 CLINICAL DATA:  Left femoral fracture, internal fixation EXAM: DG C-ARM 61-120 MIN; OPERATIVE LEFT HIP WITH PELVIS COMPARISON:  None. FINDINGS: Multiple intraoperative spot images demonstrate internal fixation across the left femoral intertrochanteric fracture. Anatomic alignment. No hardware or bony complicating feature. IMPRESSION: Internal fixation across the left intertrochanteric fracture with anatomic alignment. Electronically Signed   By: Charlett Nose M.D.   On: 11/30/2016 11:35   Dg Hip Operative Unilat W Or W/o Pelvis Left  Result Date:  11/30/2016 CLINICAL DATA:  Left femoral fracture, internal fixation EXAM: DG C-ARM 61-120 MIN; OPERATIVE LEFT HIP WITH PELVIS COMPARISON:  None. FINDINGS: Multiple intraoperative spot images demonstrate internal fixation across the left femoral intertrochanteric fracture. Anatomic alignment. No hardware or bony complicating feature. IMPRESSION: Internal fixation across the left intertrochanteric fracture with anatomic alignment. Electronically Signed   By: Charlett Nose M.D.   On: 11/30/2016 11:35   Dg Hip Unilat With Pelvis 2-3 Views Left  Result Date: 11/29/2016 CLINICAL DATA:  Fall from standing.  Left hip pain. EXAM: DG HIP (WITH OR WITHOUT PELVIS) 2-3V LEFT COMPARISON:  None. FINDINGS: There is a comminuted intratrochanteric left proximal femoral fracture with a large butterfly fragment consisting of the lesser trochanter. The fracture is mildly impacted with no significant angulation. Soft tissue calcifications are seen. IMPRESSION: Comminuted impacted intertrochanteric left proximal femoral fracture. Electronically Signed   By: Ted Mcalpine M.D.   On: 11/29/2016 19:46    Microbiology: Recent Results (from the past 240 hour(s))  Surgical pcr screen     Status: None   Collection Time: 11/29/16 11:21 PM  Result Value Ref Range Status   MRSA, PCR NEGATIVE NEGATIVE Final   Staphylococcus aureus NEGATIVE NEGATIVE Final    Comment:        The Xpert SA Assay (FDA approved for NASAL specimens in patients over 66 years of age), is one component of a comprehensive surveillance program.  Test performance has been validated by [facility] for patients greater than or equal to 41 year old. It is not intended to diagnose infection nor to guide or monitor treatment.      Labs: Basic Metabolic Panel:  Recent Labs Lab 11/29/16 2015 12/01/16 0537 12/02/16 0456 12/03/16 0350 12/03/16 1855 12/03/16 2036  NA 132* 133* 134* 135  --  130*  K 4.3 4.3 4.3 3.9  --  4.3  CL 96* 100* 99*  100*  --  94*  CO2 25 24 27 26   --  23  GLUCOSE 256* 206* 310* 252* 584* 423*  BUN 18 6 11 17   --  19  CREATININE 0.90 0.78 0.75 0.76  --  0.99  CALCIUM 9.2 8.6* 9.2 9.0  --  8.8*   Liver Function Tests: No results for input(s): AST, ALT, ALKPHOS, BILITOT, PROT, ALBUMIN in the last 168 hours. No results for input(s): LIPASE, AMYLASE in the last 168 hours. No results for input(s): AMMONIA in the last 168 hours. CBC:  Recent Labs Lab 11/29/16 2015 12/01/16 0537 12/02/16 0456 12/03/16 0350  WBC 8.5 7.0 10.2 11.8*  NEUTROABS 6.1  --   --   --   HGB 10.3* 8.0* 8.1* 7.2*  HCT 33.0* 25.7* 25.8*  23.3*  MCV 74.2* 76.3* 75.7* 75.9*  PLT 338 260 254 237   Cardiac Enzymes: No results for input(s): CKTOTAL, CKMB, CKMBINDEX, TROPONINI in the last 168 hours. BNP: BNP (last 3 results)  Recent Labs  03/27/16 0832 05/25/16 1805 07/01/16 0330  BNP 73.7 86.2 116.4*    ProBNP (last 3 results) No results for input(s): PROBNP in the last 8760 hours.  CBG:  Recent Labs Lab 12/03/16 1630 12/03/16 2004 12/04/16 0017 12/04/16 0502 12/04/16 0929  GLUCAP 494* 425* 282* 101* 85       Signed:  Jaquelynn Wanamaker MD.  [facility] 12/04/2016, 9:57 AM

## 2016-12-05 DIAGNOSIS — D649 Anemia, unspecified: Secondary | ICD-10-CM | POA: Diagnosis not present

## 2016-12-05 DIAGNOSIS — Z952 Presence of prosthetic heart valve: Secondary | ICD-10-CM | POA: Diagnosis not present

## 2016-12-05 DIAGNOSIS — S72143D Displaced intertrochanteric fracture of unspecified femur, subsequent encounter for closed fracture with routine healing: Secondary | ICD-10-CM | POA: Diagnosis not present

## 2016-12-05 DIAGNOSIS — E1069 Type 1 diabetes mellitus with other specified complication: Secondary | ICD-10-CM | POA: Diagnosis not present

## 2016-12-06 DIAGNOSIS — G4709 Other insomnia: Secondary | ICD-10-CM | POA: Diagnosis not present

## 2016-12-06 DIAGNOSIS — S72143D Displaced intertrochanteric fracture of unspecified femur, subsequent encounter for closed fracture with routine healing: Secondary | ICD-10-CM | POA: Diagnosis not present

## 2016-12-06 DIAGNOSIS — E441 Mild protein-calorie malnutrition: Secondary | ICD-10-CM | POA: Diagnosis not present

## 2016-12-06 DIAGNOSIS — Z952 Presence of prosthetic heart valve: Secondary | ICD-10-CM | POA: Diagnosis not present

## 2016-12-06 DIAGNOSIS — F418 Other specified anxiety disorders: Secondary | ICD-10-CM | POA: Diagnosis not present

## 2016-12-06 DIAGNOSIS — N179 Acute kidney failure, unspecified: Secondary | ICD-10-CM | POA: Diagnosis not present

## 2016-12-06 DIAGNOSIS — D508 Other iron deficiency anemias: Secondary | ICD-10-CM | POA: Diagnosis not present

## 2016-12-06 DIAGNOSIS — E1010 Type 1 diabetes mellitus with ketoacidosis without coma: Secondary | ICD-10-CM | POA: Diagnosis not present

## 2016-12-06 DIAGNOSIS — E784 Other hyperlipidemia: Secondary | ICD-10-CM | POA: Diagnosis not present

## 2016-12-17 DIAGNOSIS — R05 Cough: Secondary | ICD-10-CM | POA: Diagnosis not present

## 2016-12-17 DIAGNOSIS — I5032 Chronic diastolic (congestive) heart failure: Secondary | ICD-10-CM | POA: Diagnosis not present

## 2016-12-17 DIAGNOSIS — S72143D Displaced intertrochanteric fracture of unspecified femur, subsequent encounter for closed fracture with routine healing: Secondary | ICD-10-CM | POA: Diagnosis not present

## 2016-12-17 DIAGNOSIS — E108 Type 1 diabetes mellitus with unspecified complications: Secondary | ICD-10-CM | POA: Diagnosis not present

## 2017-01-24 ENCOUNTER — Ambulatory Visit (INDEPENDENT_AMBULATORY_CARE_PROVIDER_SITE_OTHER): Payer: Commercial Managed Care - HMO | Admitting: Endocrinology

## 2017-01-24 ENCOUNTER — Encounter: Payer: Self-pay | Admitting: Endocrinology

## 2017-01-24 VITALS — BP 122/74 | HR 88 | Ht 63.0 in | Wt 133.0 lb

## 2017-01-24 DIAGNOSIS — E1065 Type 1 diabetes mellitus with hyperglycemia: Secondary | ICD-10-CM | POA: Diagnosis not present

## 2017-01-24 DIAGNOSIS — IMO0001 Reserved for inherently not codable concepts without codable children: Secondary | ICD-10-CM

## 2017-01-24 LAB — POCT GLYCOSYLATED HEMOGLOBIN (HGB A1C): HEMOGLOBIN A1C: 9.6

## 2017-01-24 MED ORDER — INSULIN DETEMIR 100 UNIT/ML ~~LOC~~ SOLN: SUBCUTANEOUS | Status: DC

## 2017-01-24 NOTE — Patient Instructions (Addendum)
check your blood sugar twice a day.  vary the time of day when you check, between before the 3 meals, and at bedtime.  also check if you have symptoms of your blood sugar being too high or too low.  please keep a record of the readings and bring it to your next appointment here.  You can write it on any piece of paper.  please call us sooner if your blood sugar goes below 70, or if you have a lot of readings over 200.    On this type of insulin schedule, resident should eat meals on a regular schedule.  If a meal is missed or significantly delayed, your blood sugar could go low. Please come back for a follow-up appointment in 6 weeks.   I am happy to review faxed cbg info sooner if necessary. Please change the levemir to 20 units each morning and 5 units each evening.

## 2017-01-24 NOTE — Progress Notes (Signed)
Subjective:    Patient ID: Madison [name], female    DOB: 07-10-1961, 56 y.o.   MRN: [id number]  HPI Pt returns for f/u of diabetes mellitus: DM type: Insulin-requiring type 2 Dx'ed: 1981 Complications: polyneuropathy, retinopathy, CVA's, and CAD.   Therapy: insulin since dx.   GDM: never. DKA: last episode was in early 2017.  Severe hypoglycemia: last episode was in 2014.  Pancreatitis: never.  Other: she was on insulin pump rx until CVA's prevented her from using the device in early 2014; in early 2015, she was changed to a simple insulin schedule, due to poor results with multiple daily injections.  In 2015, she changed from QAM levemir to QAM NPH, due to am hypoglycemia and pm hyperglycemia.  Interval history: Pt was recently in hosp for femur fracture.  She now lives at [facility].  She does not know how long she will be there.  she brings a record of her cbg's which i have reviewed today.  It varies from 97 (lunch) to 400 (afternoon).  She takes levemir 15 units qam and 10 units qpm.   Past Medical History:  Diagnosis Date  . Allergy   . Anxiety   . Aortic stenosis   . CAD (coronary artery disease)   . CHF (congestive heart failure) (HCC)   . Depression   . Diabetes (HCC) dx'd 1982   "went straight to insulin"  . Dyslipidemia   . GERD (gastroesophageal reflux disease)   . Heart murmur   . HTN (hypertension)   . Hypercholesteremia   . Myocardial infarction 12/2014  . Obesity   . Pneumonia 11/2014; 12/2014  . Rheumatoid arthritis(714.0)    "hands" (07/01/2015)  . Stroke syndrome [facility]) 1995; 2001   "when my son was born; problems w/speech and L hand since then" (01/19/2015)  . Thrombophlebitis     Past Surgical History:  Procedure Laterality Date  . AORTIC VALVE REPLACEMENT (AVR)/CORONARY ARTERY BYPASS GRAFTING (CABG)  "2014"   [name] 03/08/2014  . CARDIAC CATHETERIZATION  12/08/2014  . CATARACT EXTRACTION Left   . CESAREAN SECTION  1995  . CORONARY ANGIOPLASTY  WITH STENT PLACEMENT  12/09/2014  . CORONARY ARTERY BYPASS GRAFT    . FOOT FRACTURE SURGERY    . FRACTURE SURGERY    . INTRAMEDULLARY (IM) NAIL INTERTROCHANTERIC Left 11/30/2016   Procedure: INTRAMEDULLARY (IM) NAIL LEFT HIP;  Surgeon: Yolonda Kida, MD;  [facility] OR;  Service: Orthopedics;  Laterality: Left;  . KNEE ARTHROSCOPY Right   . LEFT HEART CATHETERIZATION WITH CORONARY ANGIOGRAM N/A 12/08/2014   Procedure: LEFT HEART CATHETERIZATION WITH CORONARY ANGIOGRAM;  Surgeon: Iran Ouch, MD;  Location: [facility] CATH LAB;  Service: Cardiovascular;  Laterality: N/A;  . NEUROPLASTY / TRANSPOSITION MEDIAN NERVE AT CARPAL TUNNEL    . ORIF ANKLE FRACTURE Left 07/04/2015   Procedure: OPEN REDUCTION INTERNAL FIXATION (ORIF)  TRIMAL ANKLE FRACTURE;  Surgeon: [name], MD;  Location: [facility] OR;  Service: Orthopedics;  Laterality: Left;  . PERCUTANEOUS CORONARY STENT INTERVENTION (PCI-S) N/A 12/09/2014   Procedure: PERCUTANEOUS CORONARY STENT INTERVENTION (PCI-S);  Surgeon: [name], MD;  Location: [facility] CATH LAB;  Service: Cardiovascular;  Laterality: N/A;  . TUBAL LIGATION  1995    Social History   Social History  . Marital status: Divorced    Spouse name: N/A  . Number of children: N/A  . Years of education: N/A   Occupational History  . Not on file.   Social History Main Topics  .  Smoking status: Never Smoker  . Smokeless tobacco: Never Used  . Alcohol use No  . Drug use: No  . Sexual activity: No   Other Topics Concern  . Not on file   Social History Narrative   Divroced and lives at home.    Has two sons who live in [place] at this time. (21&8 y/o).    Has a cat    Walks a lot.       She is having great success with her diet       Working with exercises that were given to her by PT/OT.     Current Outpatient Prescriptions on File Prior to Visit  Medication Sig Dispense Refill  . acetaminophen (TYLENOL) 325 MG tablet Take 2 tablets (650 mg total) by mouth every 6  (six) hours as needed for mild pain (or Fever >/= 101).    . citalopram (CELEXA) 40 MG tablet Take 1 tablet (40 mg total) by mouth daily. 90 tablet 3  . docusate sodium (COLACE) 100 MG capsule Take 100 mg by mouth 2 (two) times daily.    . ergocalciferol (VITAMIN D2) 50000 units capsule Take 50,000 Units by mouth every Wednesday.     Marland Kitchen guaiFENesin-dextromethorphan (ROBITUSSIN DM) 100-10 MG/5ML syrup Take 15 mLs by mouth every 4 (four) hours as needed for cough.    Marland Kitchen HYDROcodone-acetaminophen (NORCO/VICODIN) 5-325 MG tablet Take 1-2 tablets by mouth every 6 (six) hours as needed for moderate pain. 30 tablet 0  . Nutritional Supplements (NUTRITIONAL DRINK PO) Take 60 mLs by mouth 3 (three) times daily. MEDPASS 1.7    . omeprazole (PRILOSEC) 40 MG capsule Take 40 mg by mouth daily before breakfast.    . pravastatin (PRAVACHOL) 20 MG tablet Take 20 mg by mouth every evening.    . traMADol (ULTRAM) 50 MG tablet Take 1 tablet (50 mg total) by mouth every 6 (six) hours as needed for moderate pain or severe pain. (Patient taking differently: Take 50 mg by mouth every 6 (six) hours as needed (for pain). ) 10 tablet 0  . zolpidem (AMBIEN) 5 MG tablet Take 5 mg by mouth at bedtime. FOR SLEEP    . warfarin (COUMADIN) 2 MG tablet Take 1 tablet (2 mg total) by mouth one time only at 6 PM. For INR 2-3, recheck INR on 1/18  0   No current facility-administered medications on file prior to visit.     Allergies  Allergen Reactions  . Adhesive [Tape] Other (See Comments)    Reaction:  Burning   . Celebrex [Celecoxib] Rash  . Detrol [Tolterodine] Hives    Family History  Problem Relation Age of Onset  . Heart disease Mother   . Heart disease Sister   . Stroke      BP 122/74   Pulse 88   Ht 5\' 3"  (1.6 m)   Wt 133 lb (60.3 kg)   SpO2 98%   BMI 23.56 kg/m    Review of Systems She denies hypoglycemia    Objective:   Physical Exam VITAL SIGNS:  See vs page GENERAL: no distress.  In  wheelchair Pulses: dorsalis pedis intact bilat.   MSK: no deformity of the feet. CV: no leg edema.   Skin:  no ulcer on the feet.  normal color and temp on the feet.  Neuro: sensation is intact to touch on the feet, but decreased from normal.     A1c=9.6%    Assessment & Plan:  Insulin-requiring type 2 DM,  with CAD: Based on the pattern of her cbg's, she needs more of her insulin earlier in the day Hip fx, new to me: She'll need to change insulin schedule when she is back home.   Weight loss.  Her insulin requirement has decreased.    Patient is advised the following: Patient Instructions  check your blood sugar twice a day.  vary the time of day when you check, between before the 3 meals, and at bedtime.  also check if you have symptoms of your blood sugar being too high or too low.  please keep a record of the readings and bring it to your next appointment here.  You can write it on any piece of paper.  please call us sooner if your blood sugar goes below 70, or if you have a lot of readings over 200.    On this type of insulin schedule, resident should eat meals on a regular schedule.  If a meal is missed or significantly delayed, your blood sugar could go low. Please come back for a follow-up appointment in 6 weeks.   I am happy to review faxed cbg info sooner if necessary. Please change the levemir to 20 units each morning and 5 units each evening.

## 2017-03-07 ENCOUNTER — Encounter: Payer: Self-pay | Admitting: Endocrinology

## 2017-03-07 ENCOUNTER — Ambulatory Visit (INDEPENDENT_AMBULATORY_CARE_PROVIDER_SITE_OTHER): Payer: Commercial Managed Care - HMO | Admitting: Endocrinology

## 2017-03-07 DIAGNOSIS — E1051 Type 1 diabetes mellitus with diabetic peripheral angiopathy without gangrene: Secondary | ICD-10-CM | POA: Diagnosis not present

## 2017-03-07 DIAGNOSIS — E119 Type 2 diabetes mellitus without complications: Secondary | ICD-10-CM

## 2017-03-07 DIAGNOSIS — IMO0001 Reserved for inherently not codable concepts without codable children: Secondary | ICD-10-CM | POA: Insufficient documentation

## 2017-03-07 DIAGNOSIS — Z794 Long term (current) use of insulin: Secondary | ICD-10-CM

## 2017-03-07 MED ORDER — INSULIN DETEMIR 100 UNIT/ML ~~LOC~~ SOLN: 25.0000 [IU] | SUBCUTANEOUS | Status: DC

## 2017-03-07 NOTE — Progress Notes (Signed)
Subjective:    Patient ID: Madison [name], female    DOB: [birth year], 56 y.o.   MRN: [id number]  HPI Pt returns for f/u of diabetes mellitus: DM type: 1 Dx'ed: 1981 Complications: polyneuropathy, retinopathy, CVA's, and CAD.   Therapy: insulin since dx.   GDM: never. DKA: last episode was in mid-2017.  Severe hypoglycemia: last episode was in 2014.  Pancreatitis: never.  Other: she was on insulin pump rx until CVA's prevented her from using the device in early 2014; in early 2015, she was changed to a QD insulin schedule, due to poor results with multiple daily injections.  In 2015, she changed from QAM levemir to QAM NPH, due to am hypoglycemia and pm hyperglycemia. She lives at [facility]. Interval history: pt states she feels well in general.  Our office has called [facility], and obtained cbg record.  It varies from 70-400.  It is in general higher as the day goes on, but no necessarily so.   Past Medical History:  Diagnosis Date  . Allergy   . Anxiety   . Aortic stenosis   . CAD (coronary artery disease)   . CHF (congestive heart failure) (HCC)   . Depression   . Diabetes (HCC) dx'd 1982   "went straight to insulin"  . Dyslipidemia   . GERD (gastroesophageal reflux disease)   . Heart murmur   . HTN (hypertension)   . Hypercholesteremia   . Myocardial infarction (HCC) 12/2014  . Obesity   . Pneumonia 11/2014; 12/2014  . Rheumatoid arthritis(714.0)    "hands" (07/01/2015)  . Stroke syndrome [facility]) 1995; 2001   "when my son was born; problems w/speech and L hand since then" (01/19/2015)  . Thrombophlebitis     Past Surgical History:  Procedure Laterality Date  . AORTIC VALVE REPLACEMENT (AVR)/CORONARY ARTERY BYPASS GRAFTING (CABG)  "2014"   [name] 03/08/2014  . CARDIAC CATHETERIZATION  12/08/2014  . CATARACT EXTRACTION Left   . CESAREAN SECTION  1995  . CORONARY ANGIOPLASTY WITH STENT PLACEMENT  12/09/2014  . CORONARY ARTERY BYPASS GRAFT    . FOOT FRACTURE  SURGERY    . FRACTURE SURGERY    . INTRAMEDULLARY (IM) NAIL INTERTROCHANTERIC Left 11/30/2016   Procedure: INTRAMEDULLARY (IM) NAIL LEFT HIP;  Surgeon: Yolonda Kida, MD;  Location: [facility] OR;  Service: Orthopedics;  Laterality: Left;  . KNEE ARTHROSCOPY Right   . LEFT HEART CATHETERIZATION WITH CORONARY ANGIOGRAM N/A 12/08/2014   Procedure: LEFT HEART CATHETERIZATION WITH CORONARY ANGIOGRAM;  Surgeon: Iran Ouch, MD;  Location: [facility] CATH LAB;  Service: Cardiovascular;  Laterality: N/A;  . NEUROPLASTY / TRANSPOSITION MEDIAN NERVE AT CARPAL TUNNEL    . ORIF ANKLE FRACTURE Left 07/04/2015   Procedure: OPEN REDUCTION INTERNAL FIXATION (ORIF)  TRIMAL ANKLE FRACTURE;  Surgeon: [name], MD;  Location: [facility] OR;  Service: Orthopedics;  Laterality: Left;  . PERCUTANEOUS CORONARY STENT INTERVENTION (PCI-S) N/A 12/09/2014   Procedure: PERCUTANEOUS CORONARY STENT INTERVENTION (PCI-S);  Surgeon: [name], MD;  Location: [facility] CATH LAB;  Service: Cardiovascular;  Laterality: N/A;  . TUBAL LIGATION  1995    Social History   Social History  . Marital status: Divorced    Spouse name: N/A  . Number of children: N/A  . Years of education: N/A   Occupational History  . Not on file.   Social History Main Topics  . Smoking status: Never Smoker  . Smokeless tobacco: Never Used  . Alcohol use No  . Drug  use: No  . Sexual activity: No   Other Topics Concern  . Not on file   Social History Narrative   Divroced and lives at home.    Has two sons who live in [place] at this time. (21&8 y/o).    Has a cat    Walks a lot.       She is having great success with her diet       Working with exercises that were given to her by PT/OT.     Current Outpatient Prescriptions on File Prior to Visit  Medication Sig Dispense Refill  . acetaminophen (TYLENOL) 325 MG tablet Take 2 tablets (650 mg total) by mouth every 6 (six) hours as needed for mild pain (or Fever >/= 101).    . CALCITONIN, SALMON, IJ  Inject as directed.    . Cholecalciferol (VITAMIN D3) 50000 units CAPS Take by mouth.    . ciprofloxacin-dexamethasone (CIPRODEX) otic suspension 4 drops 2 (two) times daily.    . citalopram (CELEXA) 40 MG tablet Take 1 tablet (40 mg total) by mouth daily. 90 tablet 3  . docusate sodium (COLACE) 100 MG capsule Take 100 mg by mouth 2 (two) times daily.    . ergocalciferol (VITAMIN D2) 50000 units capsule Take 50,000 Units by mouth every Wednesday.     Marland Kitchen guaiFENesin (ROBITUSSIN) 100 MG/5ML liquid Take 200 mg by mouth 3 (three) times daily as needed for cough.    Marland Kitchen guaiFENesin-dextromethorphan (ROBITUSSIN DM) 100-10 MG/5ML syrup Take 15 mLs by mouth every 4 (four) hours as needed for cough.    Marland Kitchen HYDROcodone-acetaminophen (NORCO/VICODIN) 5-325 MG tablet Take 1-2 tablets by mouth every 6 (six) hours as needed for moderate pain. 30 tablet 0  . loratadine (CLARITIN) 10 MG tablet Take 10 mg by mouth daily.    . Nutritional Supplements (NUTRITIONAL DRINK PO) Take 60 mLs by mouth 3 (three) times daily. MEDPASS 1.7    . omeprazole (PRILOSEC) 40 MG capsule Take 40 mg by mouth daily before breakfast.    . pravastatin (PRAVACHOL) 20 MG tablet Take 20 mg by mouth every evening.    . traMADol (ULTRAM) 50 MG tablet Take 1 tablet (50 mg total) by mouth every 6 (six) hours as needed for moderate pain or severe pain. (Patient taking differently: Take 50 mg by mouth every 6 (six) hours as needed (for pain). ) 10 tablet 0  . zolpidem (AMBIEN) 5 MG tablet Take 5 mg by mouth at bedtime. FOR SLEEP    . warfarin (COUMADIN) 2 MG tablet Take 1 tablet (2 mg total) by mouth one time only at 6 PM. For INR 2-3, recheck INR on 1/18  0   No current facility-administered medications on file prior to visit.     Allergies  Allergen Reactions  . Adhesive [Tape] Other (See Comments)    Reaction:  Burning   . Celebrex [Celecoxib] Rash  . Detrol [Tolterodine] Hives    Family History  Problem Relation Age of Onset  . Heart  disease Mother   . Heart disease Sister   . Stroke      BP 132/64   Pulse 96   Ht 5\' 3"  (1.6 m)   Wt 148 lb (67.1 kg)   SpO2 98%   BMI 26.22 kg/m    Review of Systems She denies hypoglycemia.      Objective:   Physical Exam VITAL SIGNS:  See vs page GENERAL: no distress.  In wheelchair Pulses: dorsalis pedis intact bilat.  MSK: no deformity of the feet. CV: no leg edema.   Skin:  no ulcer on the feet.  normal color and temp on the feet.  Neuro: sensation is intact to touch on the feet, but decreased from normal.   Lab Results  Component Value Date   HGBA1C 9.6 01/24/2017      Assessment & Plan:  Type 1 DM: based on the pattern of her cbg's, she needs some adjustment in her therapy.   Patient Instructions  check resident's blood sugar 4 times a day: before the 3 meals, and at bedtime.  also check if you have symptoms of your blood sugar being too high or too low.  please keep a record of the readings and send with resident to each appointment.   On this type of insulin schedule, resident should eat meals on a regular schedule.  If a meal is missed or significantly delayed, your blood sugar could go low.   Please fax resident's cbg record here next week.  Please change the levemir to 25 units each morning and none in the evening.   Please come back for a follow-up appointment in 3 months.

## 2017-03-07 NOTE — Patient Instructions (Addendum)
check resident's blood sugar 4 times a day: before the 3 meals, and at bedtime.  also check if you have symptoms of your blood sugar being too high or too low.  please keep a record of the readings and send with resident to each appointment.   On this type of insulin schedule, resident should eat meals on a regular schedule.  If a meal is missed or significantly delayed, your blood sugar could go low.   Please fax resident's cbg record here next week.  Please change the levemir to 25 units each morning and none in the evening.   Please come back for a follow-up appointment in 3 months.

## 2017-03-22 ENCOUNTER — Telehealth: Payer: Self-pay

## 2017-03-22 NOTE — Telephone Encounter (Signed)
I contacted the nursing home and attempted to advise on the new instructions from Dr. Elvera Lennox. I was advised the facility was in a crisis mode due to a resident falling and could not take the verbal order. I was advised to fax the order to the [facility][id number] hall with the instructions. I formulated the order letter and faxed to the facility per request after Dr. Elvera Lennox reviewed and signed.

## 2017-03-22 NOTE — Telephone Encounter (Signed)
I would hold off giving anymore NovoLog for now, since she can start stacking her insulin and develop hypoglycemia. If the sugars do not improve in the next few hours, she may need to go the hospital. For the coming weekend, I would consider adding NovoLog to cover her meals, probably starting with 10 units before every meal, starting tonight with dinner. She may need to remain on NovoLog afterwards, if Dr. Everardo All agrees.

## 2017-03-22 NOTE — Telephone Encounter (Signed)
NP from patient's facility called in to report the patient's blood sugar readings. She stated at 12 pm the patient had a blood sugar check and the reading would not register and at that time the patient received 15 units of novolog. At 1 pm and 2 pm the blood sugar readings were rechecked and would still not register and was given 10 units of novolog at each check. At 3 pm the blood sugar was 563. The facility has ordered a bmp and the results should be back soon. Could you review and please advise on how to proceed for the weekend? Patient normal regimen is 25 units of the Levemir in the am. Pateint has been scheduled to see. Dr. Everardo All on 03/25/2017.

## 2017-03-25 ENCOUNTER — Ambulatory Visit (INDEPENDENT_AMBULATORY_CARE_PROVIDER_SITE_OTHER): Payer: Commercial Managed Care - HMO | Admitting: Endocrinology

## 2017-03-25 ENCOUNTER — Telehealth: Payer: Self-pay | Admitting: Endocrinology

## 2017-03-25 ENCOUNTER — Encounter: Payer: Self-pay | Admitting: Endocrinology

## 2017-03-25 VITALS — BP 118/68 | HR 66 | Ht 63.0 in | Wt 152.0 lb

## 2017-03-25 DIAGNOSIS — E1065 Type 1 diabetes mellitus with hyperglycemia: Secondary | ICD-10-CM | POA: Diagnosis not present

## 2017-03-25 DIAGNOSIS — IMO0001 Reserved for inherently not codable concepts without codable children: Secondary | ICD-10-CM

## 2017-03-25 LAB — POCT GLYCOSYLATED HEMOGLOBIN (HGB A1C): HEMOGLOBIN A1C: 10.5

## 2017-03-25 NOTE — Progress Notes (Signed)
Subjective:    Patient ID: Madison [name], female    DOB: 16-Aug-1961, 56 y.o.   MRN: [id number]  HPI Pt returns for f/u of diabetes mellitus: DM type: 1 Dx'ed: 1981 Complications: polyneuropathy, retinopathy, CVA's, and CAD.   Therapy: insulin since dx.   GDM: never. DKA: last episode was in mid-2017.  Severe hypoglycemia: last episode was in 2014.  Pancreatitis: never.  Other: she was on insulin pump rx until CVA's prevented her from using the device in early 2014; in early 2015, she was changed to a QD insulin schedule, due to poor results with multiple daily injections.  In 2015, she changed from QAM levemir to QAM NPH, due to am hypoglycemia and pm hyperglycemia. She lives at [facility]. Interval history: pt states she feels well in general.  Our office has called [facility], and obtained cbg record.  At last ov, levemir was changed to 25 units qam only.  [facility] was advised to fax cbg record the following week, but neither was done.  Faxed med record does not refer to qac novolog, but [facility] calls back after the visit, and asks if the qac novolog should be continued. Past Medical History:  Diagnosis Date  . Allergy   . Anxiety   . Aortic stenosis   . CAD (coronary artery disease)   . CHF (congestive heart failure) (HCC)   . Depression   . Diabetes (HCC) dx'd 1982   "went straight to insulin"  . Dyslipidemia   . GERD (gastroesophageal reflux disease)   . Heart murmur   . HTN (hypertension)   . Hypercholesteremia   . Myocardial infarction (HCC) 12/2014  . Obesity   . Pneumonia 11/2014; 12/2014  . Rheumatoid arthritis(714.0)    "hands" (07/01/2015)  . Stroke syndrome [facility]) 1995; 2001   "when my son was born; problems w/speech and L hand since then" (01/19/2015)  . Thrombophlebitis     Past Surgical History:  Procedure Laterality Date  . AORTIC VALVE REPLACEMENT (AVR)/CORONARY ARTERY BYPASS GRAFTING (CABG)  "2014"   [name] 03/08/2014  . CARDIAC  CATHETERIZATION  12/08/2014  . CATARACT EXTRACTION Left   . CESAREAN SECTION  1995  . CORONARY ANGIOPLASTY WITH STENT PLACEMENT  12/09/2014  . CORONARY ARTERY BYPASS GRAFT    . FOOT FRACTURE SURGERY    . FRACTURE SURGERY    . INTRAMEDULLARY (IM) NAIL INTERTROCHANTERIC Left 11/30/2016   Procedure: INTRAMEDULLARY (IM) NAIL LEFT HIP;  Surgeon: Yolonda Kida, MD;  Location: [facility] OR;  Service: Orthopedics;  Laterality: Left;  . KNEE ARTHROSCOPY Right   . LEFT HEART CATHETERIZATION WITH CORONARY ANGIOGRAM N/A 12/08/2014   Procedure: LEFT HEART CATHETERIZATION WITH CORONARY ANGIOGRAM;  Surgeon: Iran Ouch, MD;  Location: [facility] CATH LAB;  Service: Cardiovascular;  Laterality: N/A;  . NEUROPLASTY / TRANSPOSITION MEDIAN NERVE AT CARPAL TUNNEL    . ORIF ANKLE FRACTURE Left 07/04/2015   Procedure: OPEN REDUCTION INTERNAL FIXATION (ORIF)  TRIMAL ANKLE FRACTURE;  Surgeon: [name], MD;  Location: [facility] OR;  Service: Orthopedics;  Laterality: Left;  . PERCUTANEOUS CORONARY STENT INTERVENTION (PCI-S) N/A 12/09/2014   Procedure: PERCUTANEOUS CORONARY STENT INTERVENTION (PCI-S);  Surgeon: [name], MD;  Location: [facility] CATH LAB;  Service: Cardiovascular;  Laterality: N/A;  . TUBAL LIGATION  1995    Social History   Social History  . Marital status: Divorced    Spouse name: N/A  . Number of children: N/A  . Years of education: N/A   Occupational  History  . Not on file.   Social History Main Topics  . Smoking status: Never Smoker  . Smokeless tobacco: Never Used  . Alcohol use No  . Drug use: No  . Sexual activity: No   Other Topics Concern  . Not on file   Social History Narrative   Divroced and lives at home.    Has two sons who live in [place] at this time. (21&8 y/o).    Has a cat    Walks a lot.       She is having great success with her diet       Working with exercises that were given to her by PT/OT.     Current Outpatient Prescriptions on File Prior to Visit  Medication  Sig Dispense Refill  . acetaminophen (TYLENOL) 325 MG tablet Take 2 tablets (650 mg total) by mouth every 6 (six) hours as needed for mild pain (or Fever >/= 101).    . CALCITONIN, SALMON, IJ Inject as directed.    . Cholecalciferol (VITAMIN D3) 50000 units CAPS Take by mouth.    . ciprofloxacin-dexamethasone (CIPRODEX) otic suspension 4 drops 2 (two) times daily.    . citalopram (CELEXA) 40 MG tablet Take 1 tablet (40 mg total) by mouth daily. 90 tablet 3  . docusate sodium (COLACE) 100 MG capsule Take 100 mg by mouth 2 (two) times daily.    . ergocalciferol (VITAMIN D2) 50000 units capsule Take 50,000 Units by mouth every Wednesday.     Marland Kitchen guaiFENesin (ROBITUSSIN) 100 MG/5ML liquid Take 200 mg by mouth 3 (three) times daily as needed for cough.    Marland Kitchen guaiFENesin-dextromethorphan (ROBITUSSIN DM) 100-10 MG/5ML syrup Take 15 mLs by mouth every 4 (four) hours as needed for cough.    Marland Kitchen HYDROcodone-acetaminophen (NORCO/VICODIN) 5-325 MG tablet Take 1-2 tablets by mouth every 6 (six) hours as needed for moderate pain. 30 tablet 0  . insulin detemir (LEVEMIR) 100 UNIT/ML injection Inject 0.25 mLs (25 Units total) into the skin every morning. 10 mL   . loratadine (CLARITIN) 10 MG tablet Take 10 mg by mouth daily.    . Nutritional Supplements (NUTRITIONAL DRINK PO) Take 60 mLs by mouth 3 (three) times daily. MEDPASS 1.7    . omeprazole (PRILOSEC) 40 MG capsule Take 40 mg by mouth daily before breakfast.    . pravastatin (PRAVACHOL) 20 MG tablet Take 20 mg by mouth every evening.    . traMADol (ULTRAM) 50 MG tablet Take 1 tablet (50 mg total) by mouth every 6 (six) hours as needed for moderate pain or severe pain. (Patient taking differently: Take 50 mg by mouth every 6 (six) hours as needed (for pain). ) 10 tablet 0  . zolpidem (AMBIEN) 5 MG tablet Take 5 mg by mouth at bedtime. FOR SLEEP    . warfarin (COUMADIN) 2 MG tablet Take 1 tablet (2 mg total) by mouth one time only at 6 PM. For INR 2-3, recheck INR  on 1/18  0   No current facility-administered medications on file prior to visit.     Allergies  Allergen Reactions  . Adhesive [Tape] Other (See Comments)    Reaction:  Burning   . Celebrex [Celecoxib] Rash  . Detrol [Tolterodine] Hives    Family History  Problem Relation Age of Onset  . Heart disease Mother   . Heart disease Sister   . Stroke      BP 118/68   Pulse 66   Ht 5\' 3"  (1.6  m)   Wt 152 lb (68.9 kg)   SpO2 95%   BMI 26.93 kg/m    Review of Systems She denies hypoglycemia.      Objective:   Physical Exam VITAL SIGNS:  See vs page GENERAL: no distress Pulses: dorsalis pedis intact bilat.   MSK: no deformity of the feet. CV: no leg edema.   Skin:  no ulcer on the feet.  normal color and temp on the feet.  Neuro: sensation is intact to touch on the feet, but decreased from normal.   a1c=10.5%     Assessment & Plan:  Type 1 DM, with retinopathy: worse.  Uncertain insulin dosages. We are calling back to [facility], and asking to re-review the med record.    Patient Instructions  check resident's blood sugar 4 times a day: before the 3 meals, and at bedtime.  also check if you have symptoms of your blood sugar being too high or too low.  please keep a record of the readings and send with resident to each appointment.   On this type of insulin schedule, resident should eat meals on a regular schedule.  If a meal is missed or significantly delayed, your blood sugar could go low.   Please fax resident's cbg record here next week.  Please change the levemir to 30 units each morning and none in the evening.   Please come back for a follow-up appointment in 3 months.

## 2017-03-25 NOTE — Patient Instructions (Addendum)
check resident's blood sugar 4 times a day: before the 3 meals, and at bedtime.  also check if you have symptoms of your blood sugar being too high or too low.  please keep a record of the readings and send with resident to each appointment.   On this type of insulin schedule, resident should eat meals on a regular schedule.  If a meal is missed or significantly delayed, your blood sugar could go low.   Please fax resident's cbg record here next week.  Please change the levemir to 30 units each morning and none in the evening.   Please come back for a follow-up appointment in 3 months.

## 2017-03-25 NOTE — Telephone Encounter (Signed)
[facility] called and wanted to verify if the patient should still be doing 10 units of Novolog or if that was just for over the weekend and she should go back to 5 units.  Please advise and fax over orders.  Fax # [phone]

## 2017-03-25 NOTE — Telephone Encounter (Signed)
Please fax most recent med list back to [place], so I can review again.

## 2017-03-26 NOTE — Telephone Encounter (Signed)
Spoke with nurse at [facility] and they are faxing med list

## 2017-03-27 ENCOUNTER — Telehealth: Payer: Self-pay | Admitting: Endocrinology

## 2017-03-27 NOTE — Telephone Encounter (Signed)
please call [facility]: 1.  Is pt still on QAC novolog? 2.  Please fax cbg record up to today

## 2017-03-28 NOTE — Telephone Encounter (Signed)
They are faxing the [facility] records now and the pt is still doing the novolog before meals

## 2017-03-29 ENCOUNTER — Telehealth: Payer: Self-pay | Admitting: Endocrinology

## 2017-03-29 NOTE — Telephone Encounter (Signed)
please call [facility]: I received cbg record: Please continue the same levemir, and reduce novolog to 8 units 3 times a day (just before each meal).

## 2017-03-29 NOTE — Telephone Encounter (Signed)
Gave verbal order to Blair Heys the nurse for [facility] she stated an understanding- also stated she needs a hard copy of this order faxed

## 2017-04-01 ENCOUNTER — Telehealth: Payer: Self-pay | Admitting: Endocrinology

## 2017-04-01 NOTE — Telephone Encounter (Signed)
Patient called the [facility], stated she need to be started on her medication again, she says she was taken off of it.

## 2017-04-01 NOTE — Telephone Encounter (Signed)
Left vm requesting the patient call back to tell me the medication she is talking about in the previous note

## 2017-04-01 NOTE — Telephone Encounter (Signed)
What medication is pt asking about?

## 2017-04-04 NOTE — Telephone Encounter (Signed)
Spoke to the patient and she stated that she has started back on Levemir and Novolog and is feeling much better and just wanted to let the doctor know

## 2017-04-16 IMAGING — CR DG CHEST 1V PORT
1 series · 1 of 1 positions shown · non-contrast
Comparison: 08/02/2015.

CLINICAL DATA: Acute respiratory failure.  Shortness of breath.

EXAM:
PORTABLE CHEST - 1 VIEW

[AP]
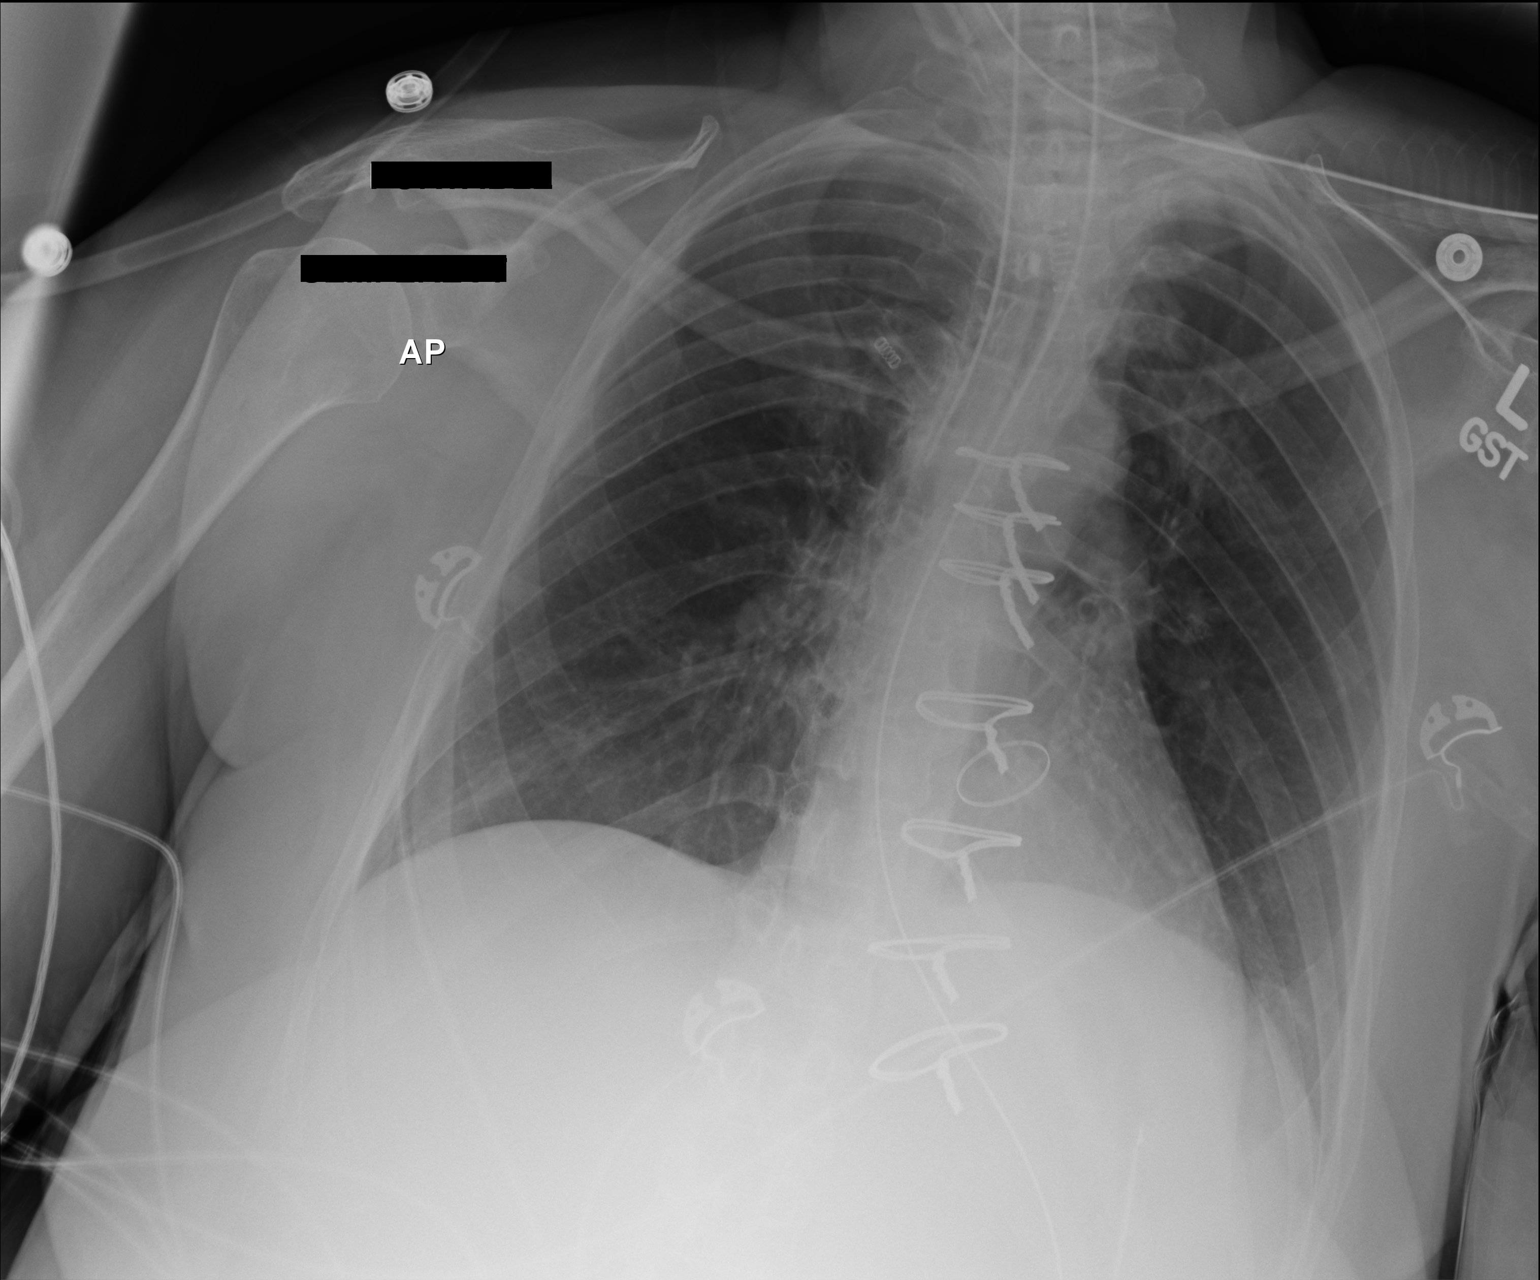

[1 of 1 positions shown; findings below may reference images not displayed]

FINDINGS: Endotracheal tube 1.7 cm above the carina. Proximal repositioning of
approximately 1-2 cm should be considered. NG tube noted in stable
position. Prior median sternotomy and cardiac valve replacement.
Heart size normal. Mild infiltrate left upper lobe cannot be
excluded on today's exam. Interim clearing of left base subsegmental
atelectasis. No significant pleural effusion. No pneumothorax.
IMPRESSION: 1. Endotracheal tube 1.7 cm above the carina. Proximal repositioning
of approximately 1-2 cm should be considered. NG tube in stable
position.
2. Prior median sternotomy and cardiac valve replacement. Heart size
normal.
3. Mild infiltrate left upper lobe. Interim clearing of left base
subsegmental atelectasis.

## 2017-04-17 IMAGING — CR DG CHEST 1V PORT
1 series · 1 of 1 positions shown · non-contrast
Comparison: 08/03/2015.

CLINICAL DATA: Respiratory failure.

EXAM:
PORTABLE CHEST - 1 VIEW

[AP]
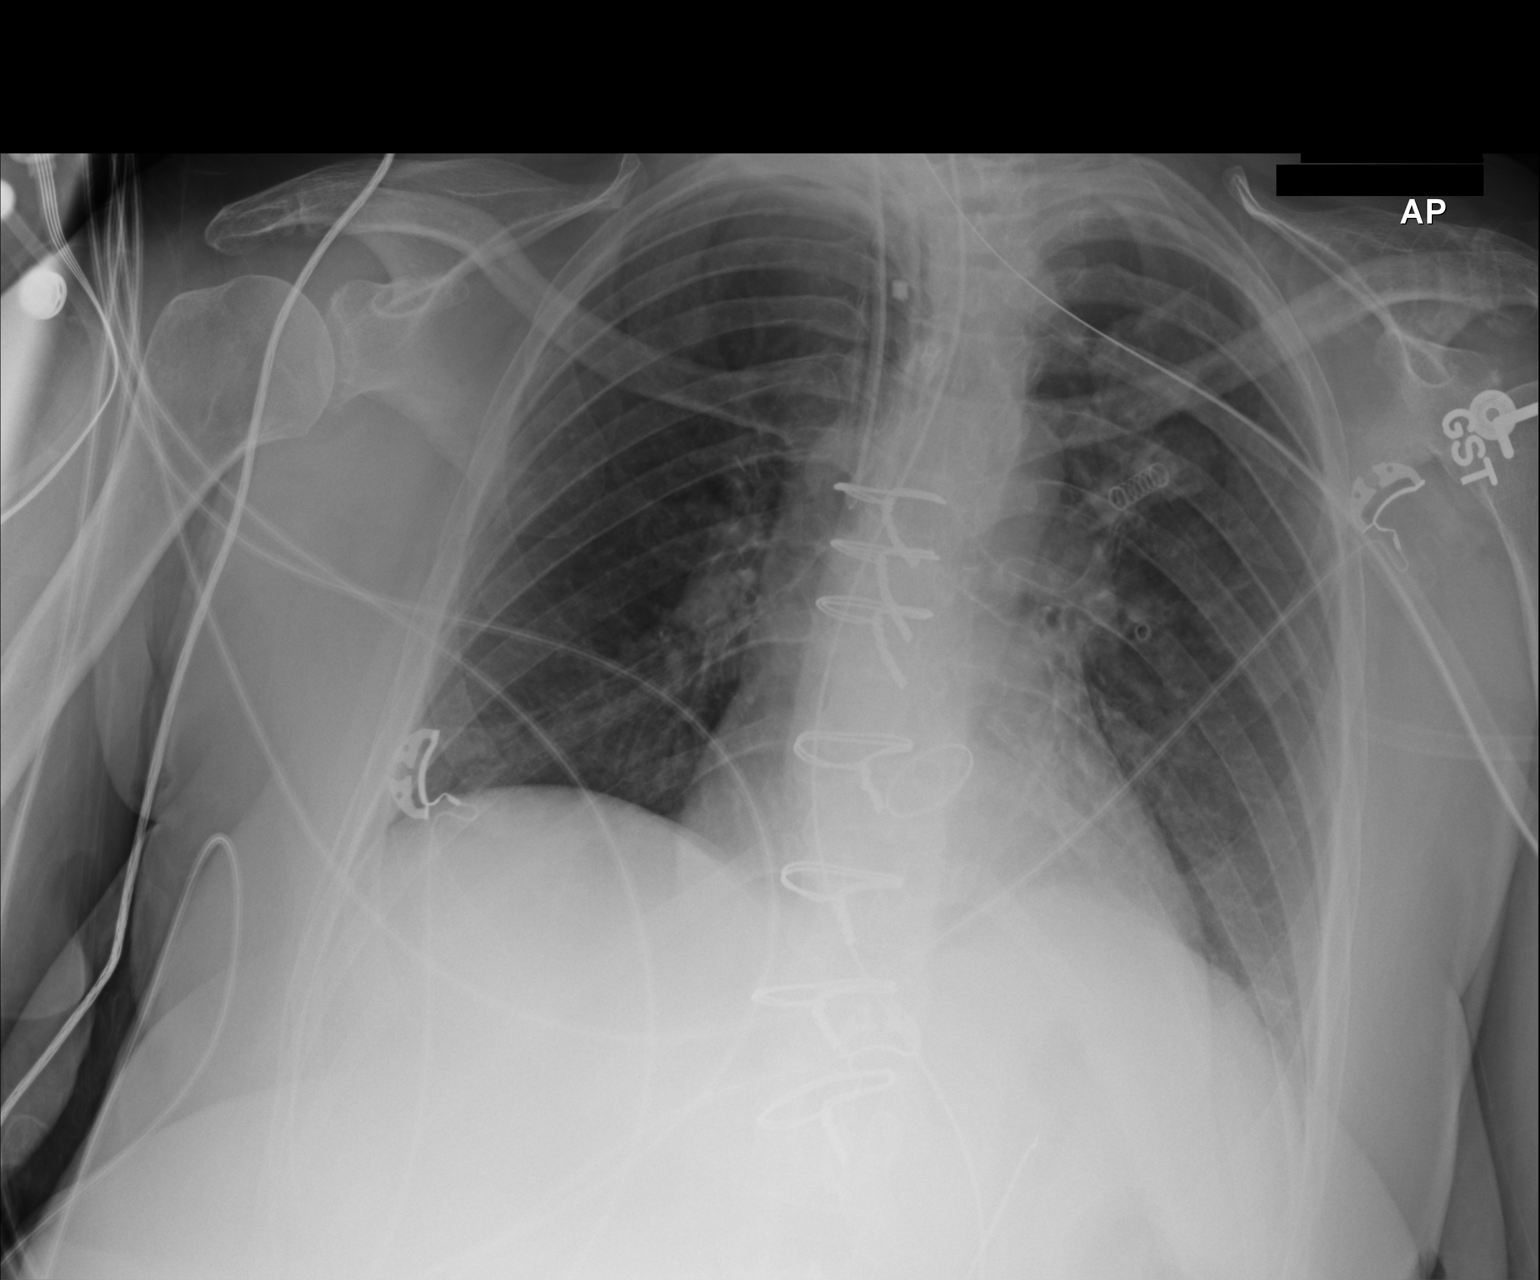

[1 of 1 positions shown; findings below may reference images not displayed]

FINDINGS: Endotracheal tube tip at the right mainstem bronchus orifice.
Proximal repositioning of approximately 3 cm suggested. NG tube in
stable position. Mediastinum hilar structures are unremarkable.
Questionable mild left upper lobe infiltrate remains. Mild left base
subsegmental atelectasis. Prior cardiac valve replacement. Heart
size normal. No pleural effusion or pneumothorax.
IMPRESSION: 1. Endotracheal tube tip at the right mainstem bronchus orifice.
Proximal repositioning of approximately 3 cm suggested. NG tube in
stable position.
2. Questionable mild left upper lobe infiltrate remains. Mild left
base subsegmental atelectasis.
Critical Value/emergent results were called by telephone at the time
of interpretation on 08/04/2015 at [DATE] to nurse John-Paul, who
verbally acknowledged these results.

## 2017-06-06 ENCOUNTER — Encounter: Payer: Self-pay | Admitting: Endocrinology

## 2017-06-06 ENCOUNTER — Ambulatory Visit (INDEPENDENT_AMBULATORY_CARE_PROVIDER_SITE_OTHER): Payer: Commercial Managed Care - HMO | Admitting: Endocrinology

## 2017-06-06 ENCOUNTER — Telehealth: Payer: Self-pay | Admitting: Endocrinology

## 2017-06-06 VITALS — BP 128/68 | HR 79 | Wt 174.8 lb

## 2017-06-06 DIAGNOSIS — E1065 Type 1 diabetes mellitus with hyperglycemia: Secondary | ICD-10-CM

## 2017-06-06 DIAGNOSIS — IMO0001 Reserved for inherently not codable concepts without codable children: Secondary | ICD-10-CM

## 2017-06-06 LAB — POCT GLYCOSYLATED HEMOGLOBIN (HGB A1C): HEMOGLOBIN A1C: 9

## 2017-06-06 MED ORDER — INSULIN NPH (HUMAN) (ISOPHANE) 100 UNIT/ML ~~LOC~~ SUSP: 15.0000 [IU] | SUBCUTANEOUS | 11 refills | Status: DC

## 2017-06-06 NOTE — Telephone Encounter (Signed)
I contacted Madison [name] and confirmed per Dr. George Hugh instructions from today's visit to D/c the levemir and start novolin N 15 units in the am. She voiced understanding and had no further questions at this time.

## 2017-06-06 NOTE — Patient Instructions (Addendum)
check resident's blood sugar 4 times a day: before the 3 meals, and at bedtime.  also check if you have symptoms of your blood sugar being too high or too low.  please keep a record of the readings and send with resident to each appointment.   On this type of insulin schedule, resident should eat meals on a regular schedule.  If a meal is missed or significantly delayed, your blood sugar could go low.   Please fax resident's cbg record here next week.  Please change the levemir to NPH,15 units each morning and none in the evening.   Please come back for a follow-up appointment in 3 months.

## 2017-06-06 NOTE — Progress Notes (Signed)
Subjective:    Patient ID: Madison [name], female    DOB: Aug 24, 1961, 56 y.o.   MRN: [id number]  HPI Pt returns for f/u of diabetes mellitus: DM type: 1 Dx'ed: 1981 Complications: polyneuropathy, retinopathy, CVA's, and CAD.   Therapy: insulin since dx.   GDM: never. DKA: last episode was in mid-2017.  Severe hypoglycemia: last episode was in 2014.  Pancreatitis: never.  Other: she was on insulin pump rx until CVA's prevented her from using the device in early 2014; in early 2015, she was changed to a QD insulin schedule, due to poor results with multiple daily injections.  In 2015, she changed from QAM levemir to QAM NPH, due to am hypoglycemia and pm hyperglycemia. She lives at [facility], where staff administers insulin and checks cbg's.   Interval history: last week, [facility] faxed a record of her cbg's which I have reviewed today.  It covers the time frame 05/25/17-05/31/17.  It varies from 73-588.  It is highest at lunch. There is otherwise no trend throughout the day.  Past Medical History:  Diagnosis Date  . Allergy   . Anxiety   . Aortic stenosis   . CAD (coronary artery disease)   . CHF (congestive heart failure) (HCC)   . Depression   . Diabetes (HCC) dx'd 1982   "went straight to insulin"  . Dyslipidemia   . GERD (gastroesophageal reflux disease)   . Heart murmur   . HTN (hypertension)   . Hypercholesteremia   . Myocardial infarction (HCC) 12/2014  . Obesity   . Pneumonia 11/2014; 12/2014  . Rheumatoid arthritis(714.0)    "hands" (07/01/2015)  . Stroke syndrome [facility]) 1995; 2001   "when my son was born; problems w/speech and L hand since then" (01/19/2015)  . Thrombophlebitis     Past Surgical History:  Procedure Laterality Date  . AORTIC VALVE REPLACEMENT (AVR)/CORONARY ARTERY BYPASS GRAFTING (CABG)  "2014"   [name] 03/08/2014  . CARDIAC CATHETERIZATION  12/08/2014  . CATARACT EXTRACTION Left   . CESAREAN SECTION  1995  . CORONARY ANGIOPLASTY WITH STENT  PLACEMENT  12/09/2014  . CORONARY ARTERY BYPASS GRAFT    . FOOT FRACTURE SURGERY    . FRACTURE SURGERY    . INTRAMEDULLARY (IM) NAIL INTERTROCHANTERIC Left 11/30/2016   Procedure: INTRAMEDULLARY (IM) NAIL LEFT HIP;  Surgeon: Yolonda Kida, MD;  Location[facility] OR;  Service: Orthopedics;  Laterality: Left;  . KNEE ARTHROSCOPY Right   . LEFT HEART CATHETERIZATION WITH CORONARY ANGIOGRAM N/A 12/08/2014   Procedure: LEFT HEART CATHETERIZATION WITH CORONARY ANGIOGRAM;  Surgeon: Iran Ouch, MD;  Location: [facility] CATH LAB;  Service: Cardiovascular;  Laterality: N/A;  . NEUROPLASTY / TRANSPOSITION MEDIAN NERVE AT CARPAL TUNNEL    . ORIF ANKLE FRACTURE Left 07/04/2015   Procedure: OPEN REDUCTION INTERNAL FIXATION (ORIF)  TRIMAL ANKLE FRACTURE;  Surgeon: [name], MD;  Location: [facility] OR;  Service: Orthopedics;  Laterality: Left;  . PERCUTANEOUS CORONARY STENT INTERVENTION (PCI-S) N/A 12/09/2014   Procedure: PERCUTANEOUS CORONARY STENT INTERVENTION (PCI-S);  Surgeon: [name], MD;  Location: [facility] CATH LAB;  Service: Cardiovascular;  Laterality: N/A;  . TUBAL LIGATION  1995    Social History   Social History  . Marital status: Divorced    Spouse name: N/A  . Number of children: N/A  . Years of education: N/A   Occupational History  . Not on file.   Social History Main Topics  . Smoking status: Never Smoker  . Smokeless  tobacco: Never Used  . Alcohol use No  . Drug use: No  . Sexual activity: No   Other Topics Concern  . Not on file   Social History Narrative   Divroced and lives at home.    Has two sons who live in [place] at this time. (21&8 y/o).    Has a cat    Walks a lot.       She is having great success with her diet       Working with exercises that were given to her by PT/OT.     Current Outpatient Prescriptions on File Prior to Visit  Medication Sig Dispense Refill  . acetaminophen (TYLENOL) 325 MG tablet Take 2 tablets (650 mg total) by mouth every 6 (six) hours  as needed for mild pain (or Fever >/= 101).    . CALCITONIN, SALMON, IJ Inject as directed.    . Cholecalciferol (VITAMIN D3) 50000 units CAPS Take by mouth.    . ciprofloxacin-dexamethasone (CIPRODEX) otic suspension 4 drops 2 (two) times daily.    . citalopram (CELEXA) 40 MG tablet Take 1 tablet (40 mg total) by mouth daily. 90 tablet 3  . docusate sodium (COLACE) 100 MG capsule Take 100 mg by mouth 2 (two) times daily.    . ergocalciferol (VITAMIN D2) 50000 units capsule Take 50,000 Units by mouth every Wednesday.     Marland Kitchen guaiFENesin-dextromethorphan (ROBITUSSIN DM) 100-10 MG/5ML syrup Take 15 mLs by mouth every 4 (four) hours as needed for cough.    Marland Kitchen HYDROcodone-acetaminophen (NORCO/VICODIN) 5-325 MG tablet Take 1-2 tablets by mouth every 6 (six) hours as needed for moderate pain. 30 tablet 0  . loratadine (CLARITIN) 10 MG tablet Take 10 mg by mouth daily.    . Nutritional Supplements (NUTRITIONAL DRINK PO) Take 60 mLs by mouth 3 (three) times daily. MEDPASS 1.7    . omeprazole (PRILOSEC) 40 MG capsule Take 40 mg by mouth daily before breakfast.    . pravastatin (PRAVACHOL) 20 MG tablet Take 20 mg by mouth every evening.    . traMADol (ULTRAM) 50 MG tablet Take 1 tablet (50 mg total) by mouth every 6 (six) hours as needed for moderate pain or severe pain. (Patient taking differently: Take 50 mg by mouth every 6 (six) hours as needed (for pain). ) 10 tablet 0  . zolpidem (AMBIEN) 5 MG tablet Take 5 mg by mouth at bedtime. FOR SLEEP    . guaiFENesin (ROBITUSSIN) 100 MG/5ML liquid Take 200 mg by mouth 3 (three) times daily as needed for cough.    . warfarin (COUMADIN) 2 MG tablet Take 1 tablet (2 mg total) by mouth one time only at 6 PM. For INR 2-3, recheck INR on 1/18  0   No current facility-administered medications on file prior to visit.     Allergies  Allergen Reactions  . Adhesive [Tape] Other (See Comments)    Reaction:  Burning   . Celebrex [Celecoxib] Rash  . Detrol [Tolterodine]  Hives    Family History  Problem Relation Age of Onset  . Heart disease Mother   . Heart disease Sister   . Stroke Unknown     BP 128/68   Pulse 79   Wt 174 lb 12.8 oz (79.3 kg)   SpO2 96%   BMI 30.96 kg/m   Review of Systems Denies LOC    Objective:   Physical Exam VITAL SIGNS:  See vs page GENERAL: no distress Pulses: foot pulses are intact bilaterally.  MSK: no deformity of the feet or ankles.  CV: no edema of the legs or ankles Skin:  no ulcer on the feet or ankles, but the skin is dry.  normal color and temp on the feet and ankles Neuro: sensation is intact to touch on the feet and ankles, but decreased from normal.   Lab Results  Component Value Date   HGBA1C 9.0 06/06/2017      Assessment & Plan:  Insulin-requiring type 2 DM, with DR: Based on the pattern of her cbg's, she needs a faster-acting qd insulin schedule.    Patient Instructions  check resident's blood sugar 4 times a day: before the 3 meals, and at bedtime.  also check if you have symptoms of your blood sugar being too high or too low.  please keep a record of the readings and send with resident to each appointment.   On this type of insulin schedule, resident should eat meals on a regular schedule.  If a meal is missed or significantly delayed, your blood sugar could go low.   Please fax resident's cbg record here next week.  Please change the levemir to NPH,15 units each morning and none in the evening.   Please come back for a follow-up appointment in 3 months.

## 2017-06-06 NOTE — Telephone Encounter (Signed)
Vernona Rieger from [facility] called to get clarification for the patient's written rx for  insulin NPH Human (NOVOLIN N) 100 UNIT/ML injection. Call to advise.

## 2017-06-19 ENCOUNTER — Other Ambulatory Visit: Payer: Self-pay | Admitting: Internal Medicine

## 2017-06-19 DIAGNOSIS — Z1231 Encounter for screening mammogram for malignant neoplasm of breast: Secondary | ICD-10-CM

## 2017-06-28 ENCOUNTER — Ambulatory Visit
Admission: RE | Admit: 2017-06-28 | Discharge: 2017-06-28 | Disposition: A | Discharge status: Home / Self Care | Payer: Medicare Other | Source: Ambulatory Visit | Attending: Internal Medicine | Admitting: Internal Medicine

## 2017-06-28 DIAGNOSIS — Z1231 Encounter for screening mammogram for malignant neoplasm of breast: Secondary | ICD-10-CM

## 2017-06-29 ENCOUNTER — Telehealth: Payer: Self-pay | Admitting: Endocrinology

## 2017-06-29 NOTE — Telephone Encounter (Signed)
please call patient: I received cbg record.  It varies a lot.  Do you ever miss the insulin?  Are your meals always at usual times, or are they sometimes delayed? Please continue the same insulin for now.   I'll see you next time.

## 2017-07-01 ENCOUNTER — Ambulatory Visit: Payer: Self-pay | Admitting: Endocrinology

## 2017-07-01 ENCOUNTER — Encounter: Payer: Self-pay | Admitting: Endocrinology

## 2017-07-01 ENCOUNTER — Ambulatory Visit (INDEPENDENT_AMBULATORY_CARE_PROVIDER_SITE_OTHER): Payer: Medicare Other | Admitting: Endocrinology

## 2017-07-01 VITALS — BP 148/94 | HR 88 | Wt 175.2 lb

## 2017-07-01 DIAGNOSIS — E1051 Type 1 diabetes mellitus with diabetic peripheral angiopathy without gangrene: Secondary | ICD-10-CM | POA: Diagnosis not present

## 2017-07-01 LAB — POCT GLYCOSYLATED HEMOGLOBIN (HGB A1C): Hemoglobin A1C: 9

## 2017-07-01 MED ORDER — INSULIN DETEMIR 100 UNIT/ML ~~LOC~~ SOLN: 20.0000 [IU] | Freq: Every day | SUBCUTANEOUS | 11 refills | Status: AC

## 2017-07-01 MED ORDER — INSULIN ASPART 100 UNIT/ML ~~LOC~~ SOLN: 13.0000 [IU] | Freq: Three times a day (TID) | SUBCUTANEOUS | 11 refills | Status: AC

## 2017-07-01 NOTE — Patient Instructions (Signed)
check resident's blood sugar 4 times a day: before the 3 meals, and at bedtime.  also check if you have symptoms of your blood sugar being too high or too low.  please keep a record of the readings and send with resident to each appointment.   On this type of insulin schedule, resident should eat meals on a regular schedule.  If a meal is missed or significantly delayed, your blood sugar could go low.   Please: D/c NPH insulin, and: Reduce the levemir to 20 units qd, and: Increase the novolog to 13 units 3 times a day (just before each meal).  Please fax resident's cbg record here next week. Please come back for a follow-up appointment in 3 months.

## 2017-07-01 NOTE — Progress Notes (Signed)
Subjective:    Patient ID: Madison [name], female    DOB: [birth year], 56 y.o.   MRN: [id number]  HPI Pt returns for f/u of diabetes mellitus: DM type: 1 Dx'ed: 1981 Complications: polyneuropathy, retinopathy, CVA's, and CAD.   Therapy: insulin since dx.   GDM: never. DKA: last episode was in mid-2017.  Severe hypoglycemia: last episode was in 2014.  Pancreatitis: never.  Other: she was on insulin pump rx until CVA's prevented her from using the device in early 2014; in early 2015, she was changed to a QD insulin schedule, due to poor results with multiple daily injections.  In 2015, she changed from QAM levemir to QAM NPH, due to am hypoglycemia and pm hyperglycemia. In 2018, she resumed multiple daily injections.  She lives at [facility], where staff administers insulin and checks cbg's.   Interval history: last week, [facility] faxed a record of her cbg's which I have reviewed today.  It covers the time frame 06/14/17-06/25/17.  It varies from 64-400.  It is lowest fasting, and at lunch. There is otherwise no trend throughout the day.  I reviewed med list.  She is on 3 insulins: NPH, 15 units QAM, novolog, 8 units 3 times a day (just before each meal), and levemir, 30 units QAM.  Our office staff has called [facility], and confirmed pt is receiving all 3 insulins (new since last ov here).   Past Medical History:  Diagnosis Date  . Allergy   . Anxiety   . Aortic stenosis   . CAD (coronary artery disease)   . CHF (congestive heart failure) (HCC)   . Depression   . Diabetes (HCC) dx'd 1982   "went straight to insulin"  . Dyslipidemia   . GERD (gastroesophageal reflux disease)   . Heart murmur   . HTN (hypertension)   . Hypercholesteremia   . Myocardial infarction (HCC) 12/2014  . Obesity   . Pneumonia 11/2014; 12/2014  . Rheumatoid arthritis(714.0)    "hands" (07/01/2015)  . Stroke syndrome [facility]) 1995; 2001   "when my son was born; problems w/speech and L hand since then"  (01/19/2015)  . Thrombophlebitis     Past Surgical History:  Procedure Laterality Date  . AORTIC VALVE REPLACEMENT (AVR)/CORONARY ARTERY BYPASS GRAFTING (CABG)  "2014"   [name] 03/08/2014  . CARDIAC CATHETERIZATION  12/08/2014  . CATARACT EXTRACTION Left   . CESAREAN SECTION  1995  . CORONARY ANGIOPLASTY WITH STENT PLACEMENT  12/09/2014  . CORONARY ARTERY BYPASS GRAFT    . FOOT FRACTURE SURGERY    . FRACTURE SURGERY    . INTRAMEDULLARY (IM) NAIL INTERTROCHANTERIC Left 11/30/2016   Procedure: INTRAMEDULLARY (IM) NAIL LEFT HIP;  Surgeon: Yolonda Kida, MD;  Location: [facility] OR;  Service: Orthopedics;  Laterality: Left;  . KNEE ARTHROSCOPY Right   . LEFT HEART CATHETERIZATION WITH CORONARY ANGIOGRAM N/A 12/08/2014   Procedure: LEFT HEART CATHETERIZATION WITH CORONARY ANGIOGRAM;  Surgeon: Iran Ouch, MD;  Location: [facility] CATH LAB;  Service: Cardiovascular;  Laterality: N/A;  . NEUROPLASTY / TRANSPOSITION MEDIAN NERVE AT CARPAL TUNNEL    . ORIF ANKLE FRACTURE Left 07/04/2015   Procedure: OPEN REDUCTION INTERNAL FIXATION (ORIF)  TRIMAL ANKLE FRACTURE;  Surgeon: [name], MD;  Location: [facility] OR;  Service: Orthopedics;  Laterality: Left;  . PERCUTANEOUS CORONARY STENT INTERVENTION (PCI-S) N/A 12/09/2014   Procedure: PERCUTANEOUS CORONARY STENT INTERVENTION (PCI-S);  Surgeon: [name], MD;  Location: [facility] CATH LAB;  Service: Cardiovascular;  Laterality:  N/A;  . TUBAL LIGATION  1995    Social History   Social History  . Marital status: Divorced    Spouse name: N/A  . Number of children: N/A  . Years of education: N/A   Occupational History  . Not on file.   Social History Main Topics  . Smoking status: Never Smoker  . Smokeless tobacco: Never Used  . Alcohol use No  . Drug use: No  . Sexual activity: No   Other Topics Concern  . Not on file   Social History Narrative   Divroced and lives at home.    Has two sons who live in [place] at this time. (21&8 y/o).    Has a cat     Walks a lot.       She is having great success with her diet       Working with exercises that were given to her by PT/OT.     Current Outpatient Prescriptions on File Prior to Visit  Medication Sig Dispense Refill  . acetaminophen (TYLENOL) 325 MG tablet Take 2 tablets (650 mg total) by mouth every 6 (six) hours as needed for mild pain (or Fever >/= 101).    . CALCITONIN, SALMON, IJ Inject as directed.    . Cholecalciferol (VITAMIN D3) 50000 units CAPS Take by mouth.    . ciprofloxacin-dexamethasone (CIPRODEX) otic suspension 4 drops 2 (two) times daily.    . citalopram (CELEXA) 40 MG tablet Take 1 tablet (40 mg total) by mouth daily. 90 tablet 3  . docusate sodium (COLACE) 100 MG capsule Take 100 mg by mouth 2 (two) times daily.    . ergocalciferol (VITAMIN D2) 50000 units capsule Take 50,000 Units by mouth every Wednesday.     Marland Kitchen guaiFENesin (ROBITUSSIN) 100 MG/5ML liquid Take 200 mg by mouth 3 (three) times daily as needed for cough.    Marland Kitchen guaiFENesin-dextromethorphan (ROBITUSSIN DM) 100-10 MG/5ML syrup Take 15 mLs by mouth every 4 (four) hours as needed for cough.    Marland Kitchen HYDROcodone-acetaminophen (NORCO/VICODIN) 5-325 MG tablet Take 1-2 tablets by mouth every 6 (six) hours as needed for moderate pain. 30 tablet 0  . loratadine (CLARITIN) 10 MG tablet Take 10 mg by mouth daily.    . Nutritional Supplements (NUTRITIONAL DRINK PO) Take 60 mLs by mouth 3 (three) times daily. MEDPASS 1.7    . omeprazole (PRILOSEC) 40 MG capsule Take 40 mg by mouth daily before breakfast.    . pravastatin (PRAVACHOL) 20 MG tablet Take 20 mg by mouth every evening.    . traMADol (ULTRAM) 50 MG tablet Take 1 tablet (50 mg total) by mouth every 6 (six) hours as needed for moderate pain or severe pain. (Patient taking differently: Take 50 mg by mouth every 6 (six) hours as needed (for pain). ) 10 tablet 0  . warfarin (COUMADIN) 2 MG tablet Take 1 tablet (2 mg total) by mouth one time only at 6 PM. For INR 2-3, recheck  INR on 1/18  0  . zolpidem (AMBIEN) 5 MG tablet Take 5 mg by mouth at bedtime. FOR SLEEP     No current facility-administered medications on file prior to visit.     Allergies  Allergen Reactions  . Adhesive [Tape] Other (See Comments)    Reaction:  Burning   . Celebrex [Celecoxib] Rash  . Detrol [Tolterodine] Hives    Family History  Problem Relation Age of Onset  . Heart disease Mother   . Heart disease Sister   .  Stroke Unknown   . Breast cancer Neg Hx     BP (!) 148/94   Pulse 88   Wt 175 lb 3.2 oz (79.5 kg)   SpO2 98%   BMI 31.04 kg/m    Review of Systems Denies LOC    Objective:   Physical Exam VITAL SIGNS:  See vs page GENERAL: no distress Pulses: dorsalis pedis intact bilat.   MSK: no deformity of the feet CV: no leg edema Skin:  no ulcer on the feet.  normal color and temp on the feet. Neuro: sensation is intact to touch on the feet, but decreased from normal.    Lab Results  Component Value Date   HGBA1C 9.0 07/01/2017      Assessment & Plan:  Insulin-requiring type 2 DM, with CVA's.  We need to simplify regimen  Patient Instructions  check resident's blood sugar 4 times a day: before the 3 meals, and at bedtime.  also check if you have symptoms of your blood sugar being too high or too low.  please keep a record of the readings and send with resident to each appointment.   On this type of insulin schedule, resident should eat meals on a regular schedule.  If a meal is missed or significantly delayed, your blood sugar could go low.   Please: D/c NPH insulin, and: Reduce the levemir to 20 units qd, and: Increase the novolog to 13 units 3 times a day (just before each meal).  Please fax resident's cbg record here next week. Please come back for a follow-up appointment in 3 months.

## 2017-07-01 NOTE — Telephone Encounter (Signed)
Called and spoke with nurse at [facility] and she stated that she never misses insulin. She said that meals are usually served around the same time daily, but that he blood sugars always vary.

## 2017-07-18 ENCOUNTER — Telehealth: Payer: Self-pay

## 2017-07-18 NOTE — Telephone Encounter (Signed)
Spoke with medical records staff, Larita Fife, at [facility]. She stated that she will fax notes from patient chart for Dr. Nehemiah Settle referral.

## 2017-07-25 ENCOUNTER — Telehealth: Payer: Self-pay | Admitting: Endocrinology

## 2017-07-25 NOTE — Telephone Encounter (Signed)
please call [facility]: Reduce the levemir to 10 units qd, and: Increase the novolog to 15 units 3 times a day (just before each meal).

## 2017-07-25 NOTE — Telephone Encounter (Signed)
Sent fax of new dosages to [facility]. 

## 2017-08-07 ENCOUNTER — Ambulatory Visit: Payer: Medicare Other | Admitting: Interventional Cardiology

## 2017-08-10 ENCOUNTER — Telehealth: Payer: Self-pay | Admitting: Endocrinology

## 2017-08-10 NOTE — Telephone Encounter (Signed)
please call [facility]: Reduce the levemir to 8 units qhs, and: change the novolog to 3 times a day (just before each meal), 17-13-13 units. Ret as scheduled

## 2017-08-12 ENCOUNTER — Ambulatory Visit (INDEPENDENT_AMBULATORY_CARE_PROVIDER_SITE_OTHER): Payer: Medicare Other | Admitting: Nurse Practitioner

## 2017-08-12 ENCOUNTER — Encounter: Payer: Self-pay | Admitting: Nurse Practitioner

## 2017-08-12 ENCOUNTER — Encounter (INDEPENDENT_AMBULATORY_CARE_PROVIDER_SITE_OTHER): Payer: Self-pay

## 2017-08-12 VITALS — BP 100/70 | HR 74 | Ht 63.0 in | Wt 182.1 lb

## 2017-08-12 DIAGNOSIS — Z952 Presence of prosthetic heart valve: Secondary | ICD-10-CM

## 2017-08-12 NOTE — Progress Notes (Signed)
CARDIOLOGY OFFICE NOTE  Date:  08/12/2017    Madison [name] Date of Birth: 01-25-1961 Medical Record #[id number]  PCP:  [name], NP  Cardiologist:  Eden Emms  Chief Complaint  Patient presents with  . Cardiac Valve Problem    Work in visit - seen for Dr. Eden Emms    History of Present Illness: Madison [name] is a 56 y.o. female who presents today for a work in visit. Seen for Dr. Eden Emms.   Her last visit here was with Dr. Eden Emms back in May of 2016.   Review of the record shows that she has a history of a bicuspid AV and had AVR back in 2012 - on chronic coumadin for a mechanical valve. Echo done 03/02/13 showed EF normal Peak gradient and mean 8 mmHg across AVR with multiple perivalvular leaks and "strands" Noted multiple strokes after AVR Looks like her anticoagulation was not adequate. No CAD at cath prior to surgery.     Other issues include CAD with prior stent to the mid LAD back in January of 2016 (+troponin in setting of DKA). Left femur fracture in 11/2016, brittle DM, mild cognitive dysfunction, depression, multiple strokes and gout. She resides at [facility] permanently.   Comes in today. Here alone. Her records that were both sent and previously faxed have a different patient's medicine list. She does not know her medicines. She does not really know why she is here but says she is doing well - trying to "lose weight". She gets therapy several times a week. She can walk with a walker. She does not have chest pain. She is not short of breath. Not dizzy or lightheaded. No syncope. She has no concerns. Tells me she is seeing Dr. Nehemiah Settle later this week.   Past Medical History:  Diagnosis Date  . Allergy   . Anxiety   . Aortic stenosis   . CAD (coronary artery disease)   . CHF (congestive heart failure) (HCC)   . Depression   . Diabetes (HCC) dx'd 1982   "went straight to insulin"  . Dyslipidemia   . GERD (gastroesophageal reflux disease)   . Heart murmur   .  HTN (hypertension)   . Hypercholesteremia   . Myocardial infarction (HCC) 12/2014  . Obesity   . Pneumonia 11/2014; 12/2014  . Rheumatoid arthritis(714.0)    "hands" (07/01/2015)  . Stroke syndrome [facility]) 1995; 2001   "when my son was born; problems w/speech and L hand since then" (01/19/2015)  . Thrombophlebitis     Past Surgical History:  Procedure Laterality Date  . AORTIC VALVE REPLACEMENT (AVR)/CORONARY ARTERY BYPASS GRAFTING (CABG)  "2014"   [name] 03/08/2014  . CARDIAC CATHETERIZATION  12/08/2014  . CATARACT EXTRACTION Left   . CESAREAN SECTION  1995  . CORONARY ANGIOPLASTY WITH STENT PLACEMENT  12/09/2014  . CORONARY ARTERY BYPASS GRAFT    . FOOT FRACTURE SURGERY    . FRACTURE SURGERY    . INTRAMEDULLARY (IM) NAIL INTERTROCHANTERIC Left 11/30/2016   Procedure: INTRAMEDULLARY (IM) NAIL LEFT HIP;  Surgeon: Yolonda Kida, MD;  Location: [facility] OR;  Service: Orthopedics;  Laterality: Left;  . KNEE ARTHROSCOPY Right   . LEFT HEART CATHETERIZATION WITH CORONARY ANGIOGRAM N/A 12/08/2014   Procedure: LEFT HEART CATHETERIZATION WITH CORONARY ANGIOGRAM;  Surgeon: Iran Ouch, MD;  Location: [facility] CATH LAB;  Service: Cardiovascular;  Laterality: N/A;  . NEUROPLASTY / TRANSPOSITION MEDIAN NERVE AT CARPAL TUNNEL    . ORIF ANKLE FRACTURE Left  07/04/2015   Procedure: OPEN REDUCTION INTERNAL FIXATION (ORIF)  TRIMAL ANKLE FRACTURE;  Surgeon: [name], MD;  Location: [facility] OR;  Service: Orthopedics;  Laterality: Left;  . PERCUTANEOUS CORONARY STENT INTERVENTION (PCI-S) N/A 12/09/2014   Procedure: PERCUTANEOUS CORONARY STENT INTERVENTION (PCI-S);  Surgeon: [name], MD;  Location: [facility] CATH LAB;  Service: Cardiovascular;  Laterality: N/A;  . TUBAL LIGATION  1995     Medications: No outpatient prescriptions have been marked as taking for the 08/12/17 encounter (Office Visit) with Rosalio Macadamia, NP.     Allergies: Allergies  Allergen Reactions  . Adhesive [Tape] Other (See Comments)     Reaction:  Burning   . Celebrex [Celecoxib] Rash  . Detrol [Tolterodine] Hives    Social History: The patient  reports that she has never smoked. She has never used smokeless tobacco. She reports that she does not drink alcohol or use drugs.   Family History: The patient's family history includes Heart disease in her mother and sister; Stroke in her unknown relative.   Review of Systems: Please see the history of present illness.   Otherwise, the review of systems is positive for none.   All other systems are reviewed and negative.   Physical Exam: VS:  There were no vitals taken for this visit. Marland Kitchen  BMI There is no height or weight on file to calculate BMI.  Wt Readings from Last 3 Encounters:  07/01/17 175 lb 3.2 oz (79.5 kg)  06/06/17 174 lb 12.8 oz (79.3 kg)  03/25/17 152 lb (68.9 kg)    General: Pleasant. She is alert and in no acute distress. Some aphasia noted. She is in a wheelchair today.   HEENT: Normal.  Neck: Supple, no JVD, carotid bruits, or masses noted.  Cardiac: Regular rate and rhythm. Very soft outflow murmur. No edema.  Respiratory:  Lungs are clear to auscultation bilaterally with normal work of breathing.  GI: Soft and nontender.  MS: No deformity or atrophy. Gait not tested. Skin: Warm and dry. Color is normal.  Neuro:  Strength and sensation are intact and no gross focal deficits noted.  Psych: Alert, appropriate and with normal affect.   LABORATORY DATA:  EKG:  EKG is not ordered today. EKG from January with NSR.   Lab Results  Component Value Date   WBC 11.8 (H) 12/03/2016   HGB 9.9 (L) 12/04/2016   HCT 30.7 (L) 12/04/2016   PLT 237 12/03/2016   GLUCOSE 423 (H) 12/03/2016   CHOL 191 06/16/2015   TRIG 96.0 06/16/2015   HDL 67.10 06/16/2015   LDLCALC 105 (H) 06/16/2015   ALT 14 10/24/2016   AST 19 10/24/2016   NA 130 (L) 12/03/2016   K 4.3 12/03/2016   CL 94 (L) 12/03/2016   CREATININE 0.99 12/03/2016   BUN 19 12/03/2016   CO2 23  12/03/2016   TSH 1.484 06/06/2016   INR 2.30 12/04/2016   HGBA1C 9.0 07/01/2017   MICROALBUR 0.3 10/21/2014    Lab from August from the facility show HGB of 11.8, WBC 6.4 and PLT 240. Vit D is over 60.    BNP (last 3 results) No results for input(s): BNP in the last 8760 hours.  ProBNP (last 3 results) No results for input(s): PROBNP in the last 8760 hours.   Other Studies Reviewed Today:  Echo Study Conclusions 04/2016  - Left ventricle: The cavity size was normal. There was mild focal   basal hypertrophy of the septum. Systolic function was  normal.   The estimated ejection fraction was in the range of 60% to 65%.   Wall motion was normal; there were no regional wall motion   abnormalities. Doppler parameters are consistent with abnormal   left ventricular relaxation (grade 1 diastolic dysfunction). - Aortic valve: A mechanical prosthesis was present and functioning   normally. Peak velocity (S): 242 cm/s. - Mitral valve: Calcified annulus. There was mild regurgitation. - Inferior vena cava: The vessel was dilated. The respirophasic   diameter changes were blunted (< 50%), consistent with elevated   central venous pressure.  Impressions:  - When compared to prior, EF has improved. (Prior 45%).  PCI 11/2014 PROCEDURES PERFORMED:    PERCUTANEOUS CORONARY INTERVENTION OF MID LAD 90 % TUBULAR LESION -- Synergy DES 2.5 mm x 32 mm (2.8 mm)  ANGIOSEAL CLOSURE OF RFA ARTERIOTOMY  Coronary angiography 11/2014:   Left Main:  Normal  Left Anterior Descending (LAD):  Normal in size with diffuse 30% disease proximally. There is 90% tubular stenosis in the midsegment. The rest of the vessel has minor irregularities.  1st diagonal (D1):  Normal in size with 20% ostial stenosis.  2nd diagonal (D2):  Very small in size.  3rd diagonal (D3):  Very small in size.  Circumflex (LCx):  Normal in size and nondominant. There is 30% tubular stenosis in the midsegment.  1st obtuse  marginal:  Very small in size.  2nd obtuse marginal:  Very small in size.  3rd obtuse marginal:  Normal in size with no significant disease.        Ramus Intermedius:  Large in size with minor irregularities.  Right Coronary Artery: Normal in size and dominant. There is 20% proximal stenosis. There is 40% tubular stenosis in the midsegment. The rest of the vessel has minor irregularities.  Posterior descending artery: Minor irregularities.  Posterior AV segment: Minor irregularities.  Posterolateral branchs:  Minor irregularities.  Left ventriculography: Was not performed. The patient has a mechanical aortic valve   Final Conclusions:    1. Severe one-vessel coronary artery disease with 90% stenosis involving the mid LAD. 2. Inability to perform PCI via the right radial artery due to small radial artery size and inability to advance the guiding catheter.  Recommendations:  Mid LAD PCI via the femoral artery tomorrow. Resume heparin 8 hour after sheath pull. Continue to hold warfarin. The patient was already loaded with 600 mg of Plavix. After PCI, consider using warfarin and Plavix without aspirin.  Lorine Bears MD, [facility] 12/08/2014, 3:16 PM   Assessment/Plan:  1. Prior AVR in 2012 done at [facility] - last echo with stable function. Will get this updated. Tentatively plan to recheck in one year and see her back all in one day if possible. Continue SBE. Need medicines clarified - have called for the records again.   2. CAD - single vessel disease with prior stent - doing well without chest pain. Would continue with her current regimen.   3. Multiple strokes - with residual deficits. Continues with rehab and lives in [place].   4. Brittle DM  5. HLD  Current medicines are reviewed with the patient today.  The patient does not have concerns regarding medicines other than what has been noted above.  The following changes have been made:  See above.  Labs/ tests ordered today  include:   No orders of the defined types were placed in this encounter.    Disposition:   FU with Dr. Eden Emms in a year with repeat  echocardiogram.    Patient is agreeable to this plan and will call if any problems develop in the interim.   SignedNorma Fredrickson, NP  08/12/2017 1:38 PM  [facility] [address] [place], [place]  [address] Phone: [phone] Fax: [phone]

## 2017-08-12 NOTE — Patient Instructions (Addendum)
We will be checking the following labs today - NONE   Medication Instructions:    Continue with your current medicines.     Testing/Procedures To Be Arranged:  Echocardiogram  Follow-Up:   See Dr. Eden Emms in one year with an echocardiogram    Other Special Instructions:   N/A    If you need a refill on your cardiac medications before your next appointment, please call your pharmacy.   Call the [facility] office at [phone] if you have any questions, problems or concerns.

## 2017-08-12 NOTE — Telephone Encounter (Signed)
Notified nurse & she wanted to clarify that Novolin shouldn't be changed? She stated that patient takes Novolin in the morning normally not Novolog? I gave her other dosage changes.

## 2017-08-12 NOTE — Telephone Encounter (Signed)
Will fax orders which contain updated med list.   Wants to know if patient is still taking Novolin.  Ty,  -LL

## 2017-08-12 NOTE — Telephone Encounter (Signed)
I can't answer without med list

## 2017-08-12 NOTE — Telephone Encounter (Signed)
Please ask facility to send [place] a current med list, thanks.

## 2017-08-12 NOTE — Addendum Note (Signed)
Addended by: Debbe Bales on: 08/12/2017 03:41 PM   Modules accepted: Orders

## 2017-08-13 NOTE — Telephone Encounter (Signed)
Spoke with [address] nurse at [facility] in regards to patient. She stated that she would fax over current med list.

## 2017-08-14 ENCOUNTER — Other Ambulatory Visit: Payer: Self-pay

## 2017-08-14 ENCOUNTER — Ambulatory Visit (HOSPITAL_COMMUNITY): Payer: Medicare Other | Attending: Internal Medicine

## 2017-08-14 DIAGNOSIS — I2510 Atherosclerotic heart disease of native coronary artery without angina pectoris: Secondary | ICD-10-CM | POA: Insufficient documentation

## 2017-08-14 DIAGNOSIS — E785 Hyperlipidemia, unspecified: Secondary | ICD-10-CM | POA: Insufficient documentation

## 2017-08-14 DIAGNOSIS — I252 Old myocardial infarction: Secondary | ICD-10-CM | POA: Insufficient documentation

## 2017-08-14 DIAGNOSIS — I509 Heart failure, unspecified: Secondary | ICD-10-CM | POA: Diagnosis not present

## 2017-08-14 DIAGNOSIS — Z952 Presence of prosthetic heart valve: Secondary | ICD-10-CM | POA: Insufficient documentation

## 2017-08-14 DIAGNOSIS — R1011 Right upper quadrant pain: Secondary | ICD-10-CM | POA: Insufficient documentation

## 2017-08-14 DIAGNOSIS — I110 Hypertensive heart disease with heart failure: Secondary | ICD-10-CM | POA: Diagnosis not present

## 2017-08-15 ENCOUNTER — Telehealth: Payer: Self-pay | Admitting: Endocrinology

## 2017-08-15 NOTE — Telephone Encounter (Signed)
Called and spoke with patient's nurse asking if a doctor there at facility would be willing to manage her diabetes bc of discrepancies on insulins per Dr. Everardo All. Nurse on [address] stated that she would discuss this with management. Dr. Everardo All also asked that I call patient to keep her in the loop & explain why he was asking for another doctor to treat her diabetes. The only number in patient's chart however is for [facility]. There is no cell number for patient individually.

## 2017-08-15 NOTE — Telephone Encounter (Signed)
please call patient: I received papers from the facility where you live.  They say you are on much different meds from what I thought.  I think this is not safe for you. Is it OK for someone there to to take care of your diabetes?

## 2017-08-21 ENCOUNTER — Telehealth: Payer: Self-pay | Admitting: Endocrinology

## 2017-08-21 NOTE — Telephone Encounter (Signed)
Called and notified nurse of medication changes. Also asked her to continue to fax her CBG's.

## 2017-08-21 NOTE — Telephone Encounter (Signed)
please call patient's facility: Please increase novolog to 11 units 3 times a day (just before each meal) D/c NPH insulin. Please continue the same levemir.  Please fax cbg record next week

## 2017-09-05 ENCOUNTER — Telehealth: Payer: Self-pay | Admitting: Endocrinology

## 2017-09-05 ENCOUNTER — Other Ambulatory Visit: Payer: Self-pay

## 2017-09-05 NOTE — Telephone Encounter (Signed)
Laura from [facility] called wanting to know if the Novolog prescription can be changed to Humolog so that it will be cheaper for the patient. Please advise.  °Laura can be reached at [phone]. °

## 2017-09-05 NOTE — Telephone Encounter (Signed)
Called and spoke with Vernona Rieger she stated all she needed was verbal to switch to Humalog. She stated that patient's insurance changes every so many months. I gave her verbal to interchange the two.

## 2017-09-06 ENCOUNTER — Ambulatory Visit: Payer: Self-pay | Admitting: Endocrinology

## 2017-09-12 ENCOUNTER — Ambulatory Visit: Payer: Self-pay | Admitting: Cardiology

## 2017-09-17 ENCOUNTER — Telehealth: Payer: Self-pay | Admitting: Endocrinology

## 2017-09-17 NOTE — Telephone Encounter (Signed)
I have called [facility] & waited for over 10 minutes but no nurse came to the phone. I was constantly sent back to thw front desk, so I stated that I would call back in the morning or when I had time tomorrow.

## 2017-09-17 NOTE — Telephone Encounter (Signed)
please call [name]: Please verify novolog is 11 units 3 times a day (just before each meal) That pt is off NPH insulin.

## 2017-09-18 NOTE — Telephone Encounter (Signed)
This is not what we have, so please verify this is the only insulin pt takes

## 2017-09-18 NOTE — Telephone Encounter (Signed)
Patient gets 11 units lispro 3x daily is what nurse Dachia stated.

## 2017-09-19 NOTE — Telephone Encounter (Signed)
I called [facility] & nurse is faxing over dosage for patient.

## 2017-10-01 ENCOUNTER — Encounter: Payer: Self-pay | Admitting: Endocrinology

## 2017-10-01 ENCOUNTER — Telehealth: Payer: Self-pay | Admitting: *Deleted

## 2017-10-01 ENCOUNTER — Ambulatory Visit (INDEPENDENT_AMBULATORY_CARE_PROVIDER_SITE_OTHER): Payer: Medicare Other | Admitting: Endocrinology

## 2017-10-01 VITALS — BP 142/76 | HR 93 | Wt 181.0 lb

## 2017-10-01 DIAGNOSIS — E1065 Type 1 diabetes mellitus with hyperglycemia: Secondary | ICD-10-CM

## 2017-10-01 DIAGNOSIS — E1051 Type 1 diabetes mellitus with diabetic peripheral angiopathy without gangrene: Secondary | ICD-10-CM | POA: Diagnosis not present

## 2017-10-01 LAB — POCT GLYCOSYLATED HEMOGLOBIN (HGB A1C): Hemoglobin A1C: 9.8

## 2017-10-01 NOTE — Telephone Encounter (Signed)
I called and explained situation to Madison [name] & stated that Dr. Everardo All would prefer Dr. Nehemiah Settle to take up care of diabetes management.

## 2017-10-01 NOTE — Telephone Encounter (Signed)
Bjorn Loser is calling and states the patient was seen today and she is needing clarification on the AVS instructions . She states that they are faxing over the Diabetes reading to the number they have. She is requesting a call back to see if they can provide the reading along with the patient when she comes to a appt. Please call Rhonda. Her contact [phone]. Please advise. Thank you

## 2017-10-01 NOTE — Progress Notes (Signed)
Subjective:    Patient ID: Madison [name], female    DOB: [birth year], 56 y.o.   MRN: [id number]  HPI Pt returns for f/u of diabetes mellitus: DM type: 1 Dx'ed: 1981 Complications: polyneuropathy, retinopathy, CVA's, and CAD.   Therapy: insulin since dx.   GDM: never. DKA: last episode was in mid-2017.  Severe hypoglycemia: last episode was in 2014.  Pancreatitis: never.  Other: she was on insulin pump rx until CVA's prevented her from using the device in early 2014; in early 2015, she was changed to a QD insulin schedule, due to poor results with multiple daily injections.  In 2015, she changed from QAM levemir to QAM NPH, due to am hypoglycemia and pm hyperglycemia. In 2018, she resumed multiple daily injections.  She lives at [facility], where staff administers insulin and checks cbg's.   Interval history: I have reviewed med list.  It shows levemir 30 units qam, and novolog 11 units 3 times a day (just before each meal).  she brings a record of her cbg's which I have reviewed today.  It varies from 55-"high."  It is lowest fasting.   Past Medical History:  Diagnosis Date  . Allergy   . Anxiety   . Aortic stenosis   . CAD (coronary artery disease)   . CHF (congestive heart failure) (HCC)   . Depression   . Diabetes (HCC) dx'd 1982   "went straight to insulin"  . Dyslipidemia   . GERD (gastroesophageal reflux disease)   . Heart murmur   . HTN (hypertension)   . Hypercholesteremia   . Myocardial infarction (HCC) 12/2014  . Obesity   . Pneumonia 11/2014; 12/2014  . Rheumatoid arthritis(714.0)    "hands" (07/01/2015)  . Stroke syndrome 1995; 2001   "when my son was born; problems w/speech and L hand since then" (01/19/2015)  . Thrombophlebitis     Past Surgical History:  Procedure Laterality Date  . AORTIC VALVE REPLACEMENT (AVR)/CORONARY ARTERY BYPASS GRAFTING (CABG)  "2014"   [name] 03/08/2014  . CARDIAC CATHETERIZATION  12/08/2014  . CATARACT EXTRACTION Left   .  CESAREAN SECTION  1995  . CORONARY ANGIOPLASTY WITH STENT PLACEMENT  12/09/2014  . CORONARY ARTERY BYPASS GRAFT    . FOOT FRACTURE SURGERY    . FRACTURE SURGERY    . KNEE ARTHROSCOPY Right   . NEUROPLASTY / TRANSPOSITION MEDIAN NERVE AT CARPAL TUNNEL    . TUBAL LIGATION  1995    Social History   Socioeconomic History  . Marital status: Divorced    Spouse name: Not on file  . Number of children: Not on file  . Years of education: Not on file  . Highest education level: Not on file  Social Needs  . Financial resource strain: Not on file  . Food insecurity - worry: Not on file  . Food insecurity - inability: Not on file  . Transportation needs - medical: Not on file  . Transportation needs - non-medical: Not on file  Occupational History  . Not on file  Tobacco Use  . Smoking status: Never Smoker  . Smokeless tobacco: Never Used  Substance and Sexual Activity  . Alcohol use: No    Alcohol/week: 0.0 oz  . Drug use: No  . Sexual activity: No  Other Topics Concern  . Not on file  Social History Narrative   Divroced and lives at home.    Has two sons who live in [place] at this time. (21&8 y/o).  Has a cat    Walks a lot.       She is having great success with her diet       Working with exercises that were given to her by PT/OT.     Current Outpatient Medications on File Prior to Visit  Medication Sig Dispense Refill  . acetaminophen (TYLENOL) 325 MG tablet Take 2 tablets (650 mg total) by mouth every 6 (six) hours as needed for mild pain (or Fever >/= 101).    . benzonatate (TESSALON) 100 MG capsule Take 100 mg by mouth 3 (three) times daily as needed for cough.    Marland Kitchen CALCITONIN, SALMON, IJ Inject as directed.    . citalopram (CELEXA) 40 MG tablet Take 1 tablet (40 mg total) by mouth daily. 90 tablet 3  . docusate sodium (COLACE) 100 MG capsule Take 100 mg by mouth 2 (two) times daily.    . ergocalciferol (VITAMIN D2) 50000 units capsule Take 50,000 Units by mouth every  Wednesday.     . gabapentin (NEURONTIN) 300 MG capsule Take 300 mg by mouth at bedtime.    Marland Kitchen guaiFENesin (ROBITUSSIN) 100 MG/5ML liquid Take 200 mg by mouth 3 (three) times daily as needed for cough.    . insulin aspart (NOVOLOG) 100 UNIT/ML injection Inject 13 Units into the skin 3 (three) times daily before meals. 10 mL 11  . insulin detemir (LEVEMIR) 100 UNIT/ML injection Inject 0.2 mLs (20 Units total) into the skin at bedtime. 10 mL 11  . lisinopril (PRINIVIL,ZESTRIL) 2.5 MG tablet Take 2.5 mg by mouth daily.    Marland Kitchen loratadine (CLARITIN) 10 MG tablet Take 10 mg by mouth daily.    . Menthol, Topical Analgesic, (BIOFREEZE) 4 % GEL Apply 4 % topically 4 (four) times daily. Arthritis in shoulder    . omeprazole (PRILOSEC) 40 MG capsule Take 40 mg by mouth daily before breakfast.    . polyethylene glycol (MIRALAX / GLYCOLAX) packet Take 17 g by mouth daily as needed for mild constipation.    . pravastatin (PRAVACHOL) 20 MG tablet Take 40 mg by mouth every evening.     . solifenacin (VESICARE) 5 MG tablet Take 5 mg by mouth daily.    . traZODone (DESYREL) 50 MG tablet Take 50 mg by mouth at bedtime.    . vitamin C (ASCORBIC ACID) 500 MG tablet Take 500 mg by mouth daily.    Marland Kitchen warfarin (COUMADIN) 2 MG tablet Take 1 tablet (2 mg total) by mouth one time only at 6 PM. For INR 2-3, recheck INR on 1/18 (Patient taking differently: Take 5 mg by mouth one time only at 6 PM. For INR 2-3, recheck INR on 1/18)  0   No current facility-administered medications on file prior to visit.     Allergies  Allergen Reactions  . Adhesive [Tape] Other (See Comments)    Reaction:  Burning   . Celebrex [Celecoxib] Rash  . Detrol [Tolterodine] Hives    Family History  Problem Relation Age of Onset  . Heart disease Mother   . Heart disease Sister   . Stroke Unknown   . Breast cancer Neg Hx     BP (!) 142/76 (BP Location: Left Arm, Patient Position: Sitting, Cuff Size: Normal)   Pulse 93   Wt 181 lb (82.1  kg)   SpO2 96%   BMI 32.06 kg/m   Review of Systems Denies LOC    Objective:   Physical Exam VITAL SIGNS:  See vs page  GENERAL: no distress Pulses: dorsalis pedis intact bilat.   MSK: no deformity of the feet CV: no leg edema Skin:  no ulcer on the feet, but the skin is dry.  normal color and temp on the feet. Neuro: sensation is intact to touch on the feet, but decreased from normal.        Assessment & Plan:  Type 1 DM: ongoing poor glycemic control.  I discussed with patient the fact that we are having 2 problems.  One is that we often don't know what insulins she is on.  The other is that we have difficulty obtaining records. This doesn't meet safety standards, so I will withdraw from her care.  Pt says she understands.   Patient Instructions  check resident's blood sugar 4 times a day: before the 3 meals, and at bedtime.  also check if you have symptoms of your blood sugar being too high or too low.  please keep a record of the readings and send with resident to each appointment.   On this type of insulin schedule, resident should eat meals on a regular schedule.  If a meal is missed or significantly delayed, your blood sugar could go low.   Please: Reduce the levemir to 25 units daily, and: Increase the novolog to 12 units 3 times a day (just before each meal).  We are having trouble with receiving records from your facility, and with insulin adjustments we are not aware of.  This does not meet safety standards.  Therefore, I will step aside from your diabetes care with my best wishes for you good health.

## 2017-10-01 NOTE — Patient Instructions (Addendum)
check resident's blood sugar 4 times a day: before the 3 meals, and at bedtime.  also check if you have symptoms of your blood sugar being too high or too low.  please keep a record of the readings and send with resident to each appointment.   On this type of insulin schedule, resident should eat meals on a regular schedule.  If a meal is missed or significantly delayed, your blood sugar could go low.   Please: Reduce the levemir to 25 units daily, and: Increase the novolog to 12 units 3 times a day (just before each meal).  We are having trouble with receiving records from your facility, and with insulin adjustments we are not aware of.  This does not meet safety standards.  Therefore, I will step aside from your diabetes care with my best wishes for you good health.

## 2017-10-22 IMAGING — CR DG CHEST 1V PORT
1 series · 1 of 1 positions shown · non-contrast
Comparison: Multiple prior chest x-ray comparison, most recent
01/27/2016

CLINICAL DATA: 55-year-old female with a history of shortness of
breath and weakness

EXAM:
PORTABLE CHEST 1 VIEW

[AP]
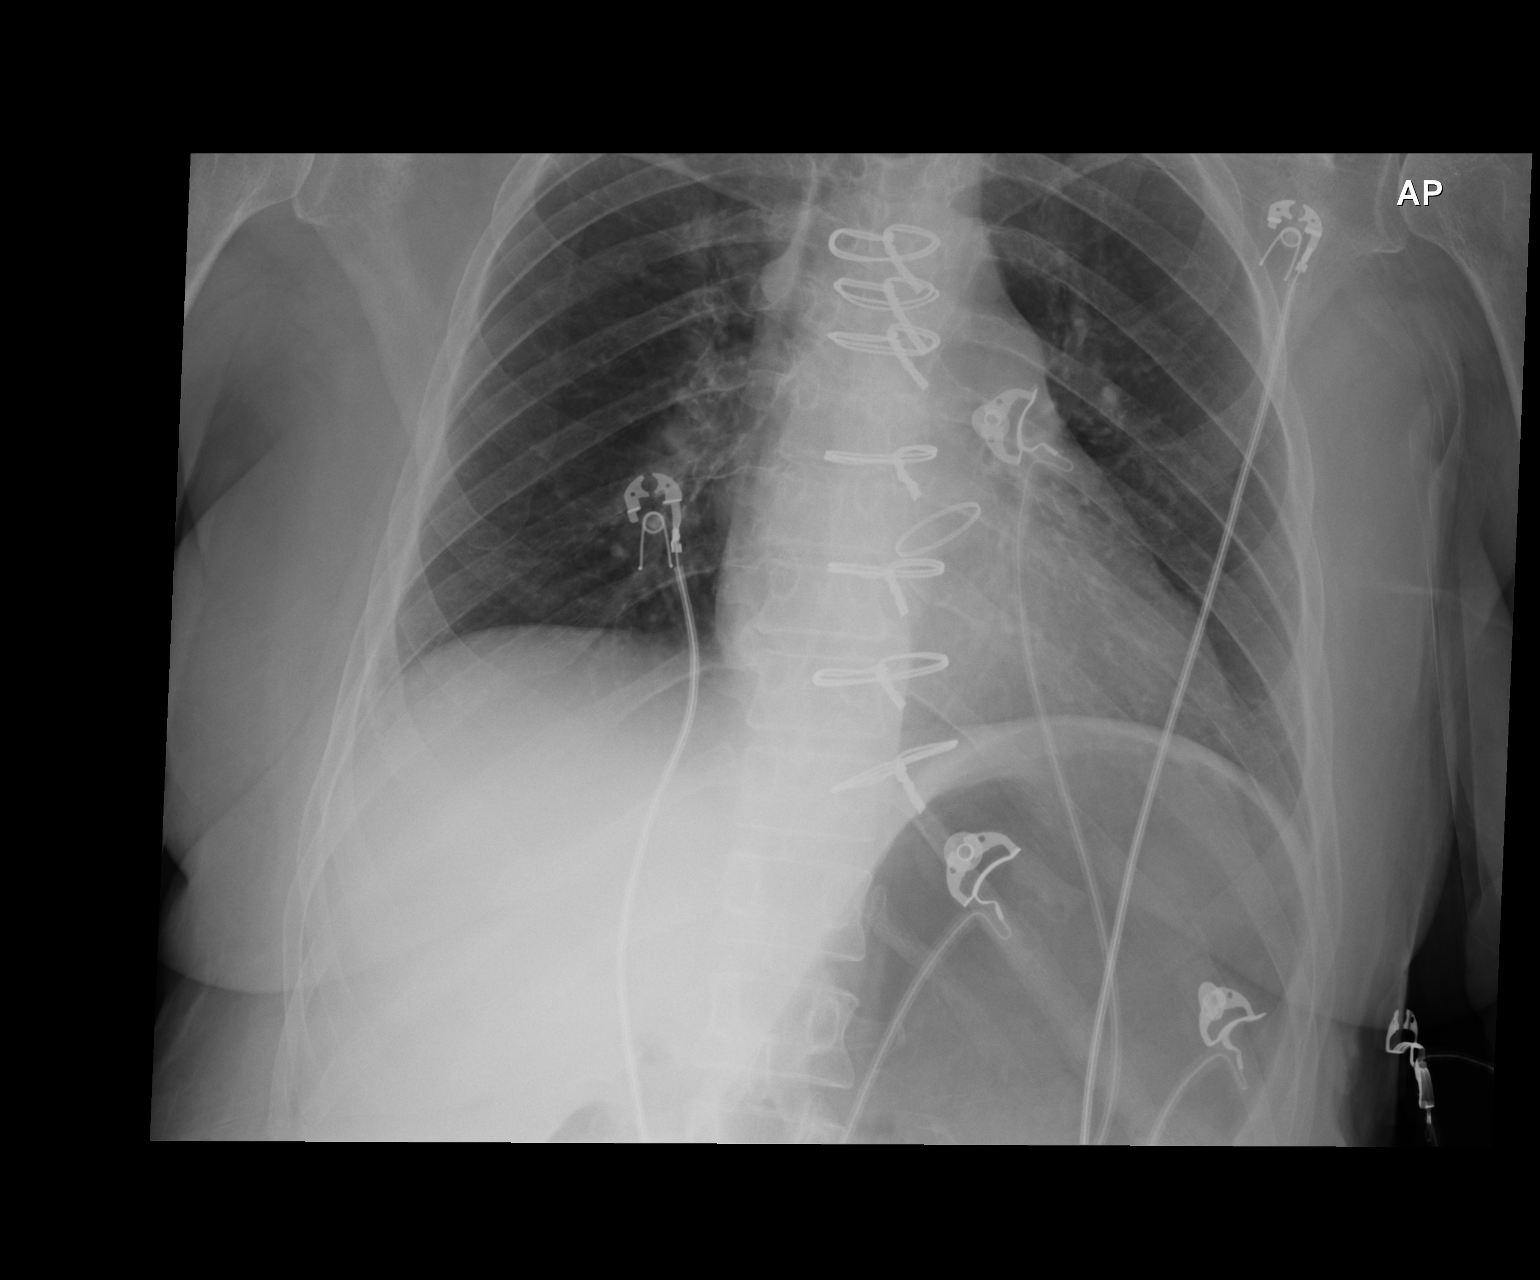

[1 of 1 positions shown; findings below may reference images not displayed]

FINDINGS: Cardiomediastinal silhouette unchanged in size and contour.

Surgical changes of prior median sternotomy and CABG.

There is uplifting of the minor fissure with
peritracheal/paramediastinal density superior to the right mainstem.
This is more pronounced than on the comparison plain film.

No left-sided confluent airspace disease. No large pleural effusion.
No pneumothorax.
IMPRESSION: Uplifting of the minor fissure with density along the
paramediastinal stripe, concerning for partial volume loss of the
right upper lobe. This may reflect mucous plugging in the setting of
infection, however, central obstructing mass cannot be excluded.
Recommend further evaluation with contrast-enhanced CT.

Surgical changes of median sternotomy and CABG.

## 2017-10-25 IMAGING — CR DG CHEST 1V PORT
1 series · 1 of 1 positions shown · non-contrast
Comparison: 02/08/2016

CLINICAL DATA: Confusion

EXAM:
PORTABLE CHEST 1 VIEW

[AP]
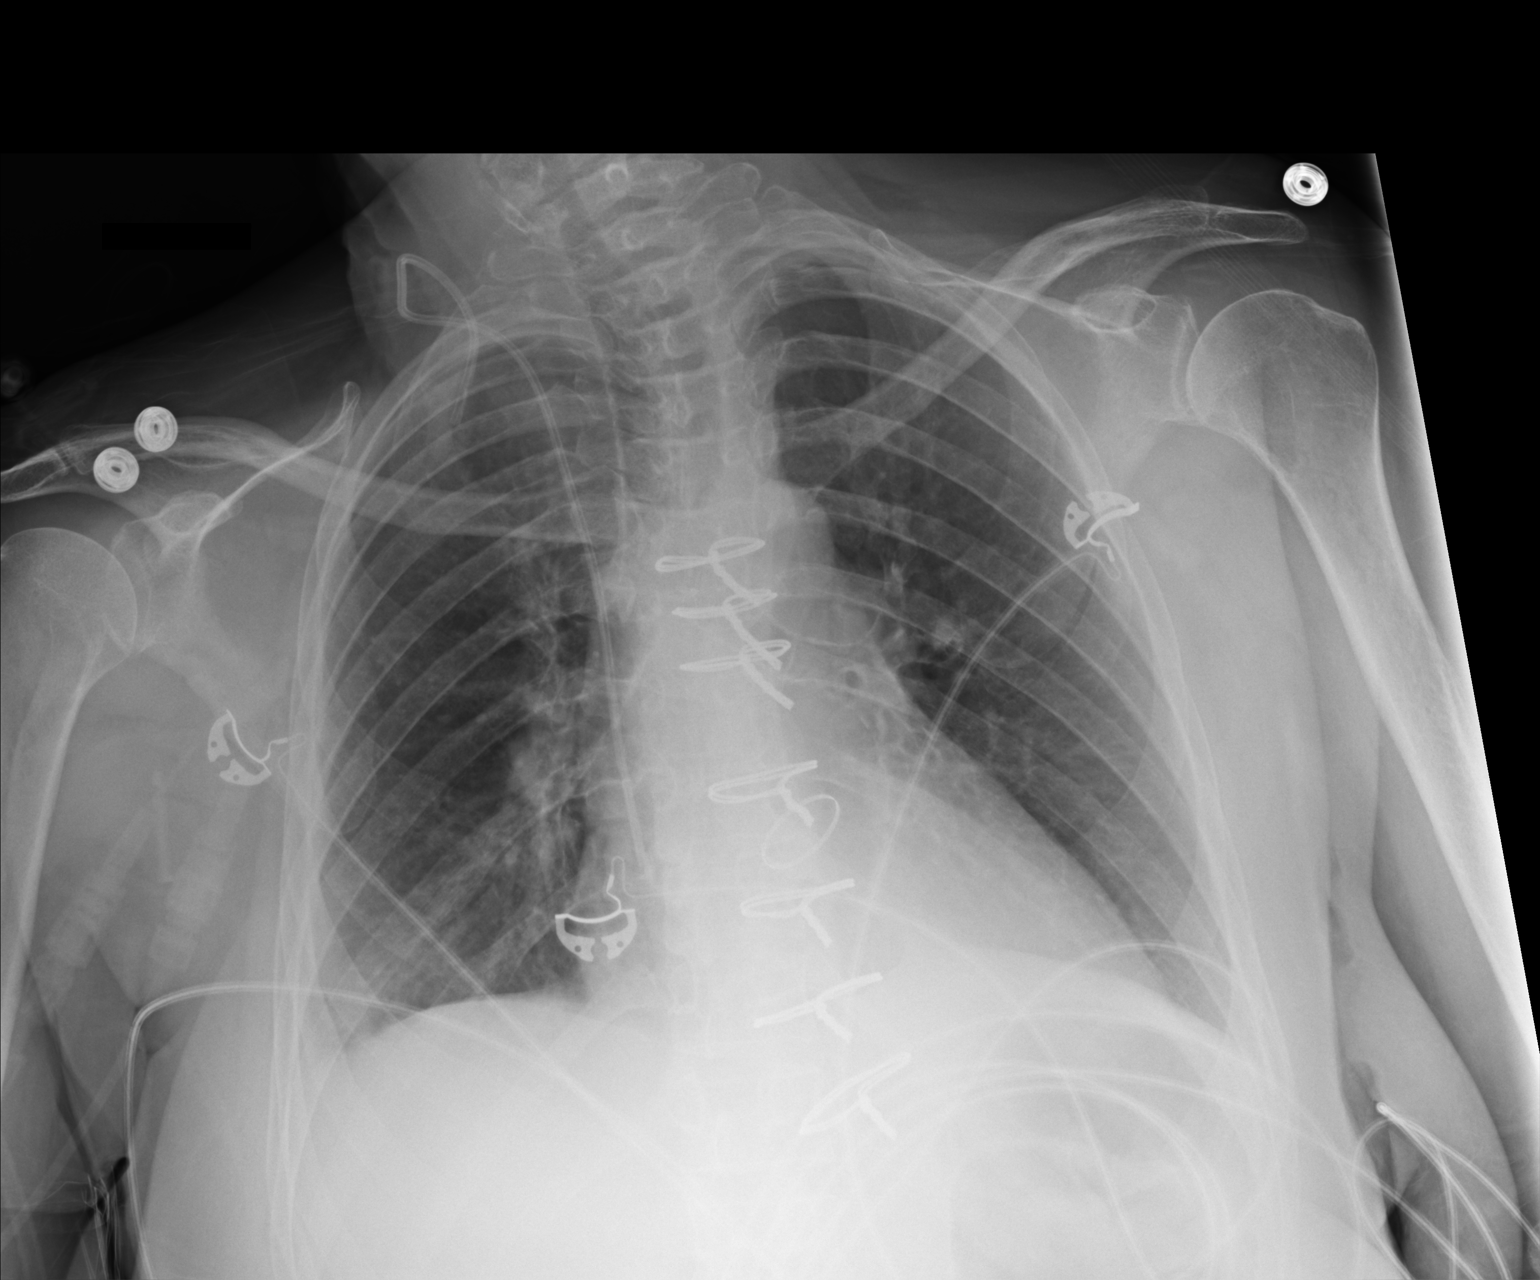

[1 of 1 positions shown; findings below may reference images not displayed]

FINDINGS: Cardiomediastinal silhouette is stable. Status post median
sternotomy and aortic valve replacement. Right IJ catheter with tip
in right atrium. No infiltrate or pulmonary edema. No pneumothorax.
IMPRESSION: No active disease. Right IJ catheter with tip in right atrium.
Status post median sternotomy.

## 2018-01-01 ENCOUNTER — Emergency Department (HOSPITAL_COMMUNITY)
Admission: EM | Admit: 2018-01-01 | Discharge: 2018-01-01 | Disposition: A | Discharge status: Home / Self Care | Payer: Medicare Other | Attending: Emergency Medicine | Admitting: Emergency Medicine

## 2018-01-01 ENCOUNTER — Encounter (HOSPITAL_COMMUNITY): Payer: Self-pay | Admitting: Internal Medicine

## 2018-01-01 ENCOUNTER — Emergency Department (HOSPITAL_COMMUNITY): Payer: Medicare Other

## 2018-01-01 DIAGNOSIS — I2510 Atherosclerotic heart disease of native coronary artery without angina pectoris: Secondary | ICD-10-CM | POA: Diagnosis not present

## 2018-01-01 DIAGNOSIS — Z79899 Other long term (current) drug therapy: Secondary | ICD-10-CM | POA: Insufficient documentation

## 2018-01-01 DIAGNOSIS — S42292A Other displaced fracture of upper end of left humerus, initial encounter for closed fracture: Secondary | ICD-10-CM | POA: Diagnosis not present

## 2018-01-01 DIAGNOSIS — Y92122 Bedroom in nursing home as the place of occurrence of the external cause: Secondary | ICD-10-CM | POA: Diagnosis not present

## 2018-01-01 DIAGNOSIS — E1010 Type 1 diabetes mellitus with ketoacidosis without coma: Secondary | ICD-10-CM | POA: Insufficient documentation

## 2018-01-01 DIAGNOSIS — Z951 Presence of aortocoronary bypass graft: Secondary | ICD-10-CM | POA: Insufficient documentation

## 2018-01-01 DIAGNOSIS — Z955 Presence of coronary angioplasty implant and graft: Secondary | ICD-10-CM | POA: Diagnosis not present

## 2018-01-01 DIAGNOSIS — I5042 Chronic combined systolic (congestive) and diastolic (congestive) heart failure: Secondary | ICD-10-CM | POA: Diagnosis not present

## 2018-01-01 DIAGNOSIS — I110 Hypertensive heart disease with heart failure: Secondary | ICD-10-CM | POA: Insufficient documentation

## 2018-01-01 DIAGNOSIS — Z794 Long term (current) use of insulin: Secondary | ICD-10-CM | POA: Diagnosis not present

## 2018-01-01 DIAGNOSIS — Y999 Unspecified external cause status: Secondary | ICD-10-CM | POA: Insufficient documentation

## 2018-01-01 DIAGNOSIS — W06XXXA Fall from bed, initial encounter: Secondary | ICD-10-CM | POA: Insufficient documentation

## 2018-01-01 DIAGNOSIS — Y9384 Activity, sleeping: Secondary | ICD-10-CM | POA: Diagnosis not present

## 2018-01-01 DIAGNOSIS — S4992XA Unspecified injury of left shoulder and upper arm, initial encounter: Secondary | ICD-10-CM | POA: Diagnosis present

## 2018-01-01 MED ORDER — HYDROCODONE-ACETAMINOPHEN 5-325 MG PO TABS: 1.0000 | ORAL_TABLET | ORAL | 0 refills | Status: DC | PRN

## 2018-01-01 MED ORDER — ACETAMINOPHEN 325 MG PO TABS
650.0000 mg | ORAL_TABLET | Freq: Once | ORAL | Status: AC
  Administered 2018-01-01: 650 mg via ORAL
  Filled 2018-01-01: qty 2

## 2018-01-01 NOTE — ED Notes (Signed)
Bed: [facility] Expected date:  Expected time:  Means of arrival:  Comments: EMS 

## 2018-01-01 NOTE — ED Provider Notes (Signed)
[facility]-EMERGENCY DEPT Provider Note   CSN: [id number] Arrival date & time: 01/01/18  1724     History   Chief Complaint Chief Complaint  Patient presents with  . Fall    HPI Madison [name] is a 57 y.o. female.  57 year old female here after rolling out of the bed at her facility.  Struck the left ear and did not have any loss of consciousness.  No neck pain.  Complains of sharp left-sided shoulder pain is worse with movement.  Denies any numbness or tingling to her left hand.  She also complains of dull left hip pain is worse with movement without any distal paresthesias.  No left rib pain.  No abdominal discomfort.  Does take Coumadin but denies any headache or visible trauma      Past Medical History:  Diagnosis Date  . Allergy   . Anxiety   . Aortic stenosis   . CAD (coronary artery disease)   . CHF (congestive heart failure) (HCC)   . Depression   . Diabetes (HCC) dx'd 1982   "went straight to insulin"  . Dyslipidemia   . GERD (gastroesophageal reflux disease)   . Heart murmur   . HTN (hypertension)   . Hypercholesteremia   . Myocardial infarction (HCC) 12/2014  . Obesity   . Pneumonia 11/2014; 12/2014  . Rheumatoid arthritis(714.0)    "hands" (07/01/2015)  . Stroke syndrome 1995; 2001   "when my son was born; problems w/speech and L hand since then" (01/19/2015)  . Thrombophlebitis     Patient Active Problem List   Diagnosis Date Noted  . Diabetes (HCC) 03/07/2017  . Closed displaced intertrochanteric fracture of left femur (HCC) 11/29/2016  . Difficult airway for intubation 11/29/2016  . SIRS (systemic inflammatory response syndrome) (HCC) 05/25/2016  . MDD (major depressive disorder), recurrent episode, moderate (HCC)   . Aspiration pneumonia (HCC) 05/17/2016  . Diabetic ketoacidosis without coma (HCC)   . Sepsis (HCC)   . Acute hypoxemic respiratory failure (HCC)   . AKI (acute kidney injury) (HCC) 03/27/2016  . Chronic combined  systolic and diastolic congestive heart failure (HCC)   . Diabetic ketoacidosis (HCC) 03/26/2016  . Hypotension 03/08/2016  . Acute encephalopathy 02/28/2016  . Diabetic ketoacidosis with coma associated with type 1 diabetes mellitus (HCC)   . Hypoglycemia   . Chronic diastolic heart failure (HCC)   . Esophageal reflux   . S/P AVR (aortic valve replacement)   . ARF (acute renal failure) (HCC) 01/22/2016  . Diabetic ketoacidosis without coma associated with type 1 diabetes mellitus (HCC)   . DKA, type 1 (HCC) 11/23/2015  . Constipation 11/23/2015  . Pressure ulcer 08/02/2015  . Diabetic ketoacidosis with coma associated with other specified diabetes mellitus (HCC)   . Closed left ankle fracture 07/01/2015  . Trimalleolar fracture of left ankle 07/01/2015  . Essential hypertension   . Diabetes type 1, uncontrolled (HCC)   . S/P aortic valve replacement   . Blood poisoning   . Severe sepsis with acute organ dysfunction (HCC) 12/03/2014  . Spastic hemiplegia affecting nondominant side (HCC) 11/29/2014  . Dysphagia, pharyngoesophageal phase 10/04/2014  . Depression 04/29/2014  . CVA (cerebral infarction) 03/12/2014  . Acute respiratory failure (HCC) 03/12/2014  . Chronic anticoagulation 07/28/2013  . CAD (coronary artery disease)   . Hyperlipidemia   . Rheumatoid arthritis [facility])     Past Surgical History:  Procedure Laterality Date  . AORTIC VALVE REPLACEMENT (AVR)/CORONARY ARTERY BYPASS GRAFTING (CABG)  "2014"   /  notes 03/08/2014  . CARDIAC CATHETERIZATION  12/08/2014  . CATARACT EXTRACTION Left   . CESAREAN SECTION  1995  . CORONARY ANGIOPLASTY WITH STENT PLACEMENT  12/09/2014  . CORONARY ARTERY BYPASS GRAFT    . FOOT FRACTURE SURGERY    . FRACTURE SURGERY    . INTRAMEDULLARY (IM) NAIL INTERTROCHANTERIC Left 11/30/2016   Procedure: INTRAMEDULLARY (IM) NAIL LEFT HIP;  Surgeon: Yolonda Kida, MD;  Location: [facility] OR;  Service: Orthopedics;  Laterality: Left;  . KNEE  ARTHROSCOPY Right   . LEFT HEART CATHETERIZATION WITH CORONARY ANGIOGRAM N/A 12/08/2014   Procedure: LEFT HEART CATHETERIZATION WITH CORONARY ANGIOGRAM;  Surgeon: Iran Ouch, MD;  Location: [facility] CATH LAB;  Service: Cardiovascular;  Laterality: N/A;  . NEUROPLASTY / TRANSPOSITION MEDIAN NERVE AT CARPAL TUNNEL    . ORIF ANKLE FRACTURE Left 07/04/2015   Procedure: OPEN REDUCTION INTERNAL FIXATION (ORIF)  TRIMAL ANKLE FRACTURE;  Surgeon: [name], MD;  Location: [facility] OR;  Service: Orthopedics;  Laterality: Left;  . PERCUTANEOUS CORONARY STENT INTERVENTION (PCI-S) N/A 12/09/2014   Procedure: PERCUTANEOUS CORONARY STENT INTERVENTION (PCI-S);  Surgeon: [name], MD;  Location: [facility] CATH LAB;  Service: Cardiovascular;  Laterality: N/A;  . TUBAL LIGATION  1995    OB History    No data available       Home Medications    Prior to Admission medications   Medication Sig Start Date End Date Taking? Authorizing Provider  acetaminophen (TYLENOL) 325 MG tablet Take 2 tablets (650 mg total) by mouth every 6 (six) hours as needed for mild pain (or Fever >/= 101). 12/04/16   Zannie Cove, MD  benzonatate (TESSALON) 100 MG capsule Take 100 mg by mouth 3 (three) times daily as needed for cough.    [provider]  CALCITONIN, SALMON, IJ Inject as directed.    [provider]  citalopram (CELEXA) 40 MG tablet Take 1 tablet (40 mg total) by mouth daily. 03/08/16   Nafziger, [name], NP  docusate sodium (COLACE) 100 MG capsule Take 100 mg by mouth 2 (two) times daily.    [provider]  ergocalciferol (VITAMIN D2) 50000 units capsule Take 50,000 Units by mouth every Wednesday.     [provider]  gabapentin (NEURONTIN) 300 MG capsule Take 300 mg by mouth at bedtime.    [provider]  guaiFENesin (ROBITUSSIN) 100 MG/5ML liquid Take 200 mg by mouth 3 (three) times daily as needed for cough.    [provider]  insulin aspart (NOVOLOG) 100 UNIT/ML  injection Inject 13 Units into the skin 3 (three) times daily before meals. 07/01/17   Romero Belling, MD  insulin detemir (LEVEMIR) 100 UNIT/ML injection Inject 0.2 mLs (20 Units total) into the skin at bedtime. 07/01/17   Romero Belling, MD  lisinopril (PRINIVIL,ZESTRIL) 2.5 MG tablet Take 2.5 mg by mouth daily.    [provider]  loratadine (CLARITIN) 10 MG tablet Take 10 mg by mouth daily.    [provider]  Menthol, Topical Analgesic, (BIOFREEZE) 4 % GEL Apply 4 % topically 4 (four) times daily. Arthritis in shoulder    [provider]  omeprazole (PRILOSEC) 40 MG capsule Take 40 mg by mouth daily before breakfast.    [provider]  polyethylene glycol (MIRALAX / GLYCOLAX) packet Take 17 g by mouth daily as needed for mild constipation.    [provider]  pravastatin (PRAVACHOL) 20 MG tablet Take 40 mg by mouth every evening.  [provider]  solifenacin (VESICARE) 5 MG tablet Take 5 mg by mouth daily.    [provider]  traZODone (DESYREL) 50 MG tablet Take 50 mg by mouth at bedtime.    [provider]  vitamin C (ASCORBIC ACID) 500 MG tablet Take 500 mg by mouth daily.    [provider]  warfarin (COUMADIN) 2 MG tablet Take 1 tablet (2 mg total) by mouth one time only at 6 PM. For INR 2-3, recheck INR on 1/18 Patient taking differently: Take 5 mg by mouth one time only at 6 PM. For INR 2-3, recheck INR on 1/18 12/04/16 08/12/17  Zannie Cove, MD    Family History Family History  Problem Relation Age of Onset  . Heart disease Mother   . Heart disease Sister   . Stroke Unknown   . Breast cancer Neg Hx     Social History Social History   Tobacco Use  . Smoking status: Never Smoker  . Smokeless tobacco: Never Used  Substance Use Topics  . Alcohol use: No    Alcohol/week: 0.0 oz  . Drug use: No     Allergies   Adhesive [tape]; Celebrex [celecoxib]; and Detrol [tolterodine]   Review of  Systems Review of Systems  All other systems reviewed and are negative.    Physical Exam Updated Vital Signs SpO2 97% Comment: RA  Physical Exam  Constitutional: She is oriented to person, place, and time. She appears well-developed and well-nourished.  Non-toxic appearance. No distress.  HENT:  Head: Normocephalic and atraumatic.  Eyes: Conjunctivae, EOM and lids are normal. Pupils are equal, round, and reactive to light.  Neck: Normal range of motion. Neck supple. No tracheal deviation present. No thyroid mass present.  Cardiovascular: Normal rate, regular rhythm and normal heart sounds. Exam reveals no gallop.  No murmur heard. Pulmonary/Chest: Effort normal and breath sounds normal. No stridor. No respiratory distress. She has no decreased breath sounds. She has no wheezes. She has no rhonchi. She has no rales.  Abdominal: Soft. Normal appearance and bowel sounds are normal. She exhibits no distension. There is no tenderness. There is no rebound and no CVA tenderness.  Musculoskeletal: She exhibits no edema.       Left shoulder: She exhibits decreased range of motion and bony tenderness. She exhibits no deformity.       Left hip: She exhibits decreased range of motion and tenderness.       Legs: Neurological: She is alert and oriented to person, place, and time. She has normal strength. No cranial nerve deficit or sensory deficit. GCS eye subscore is 4. GCS verbal subscore is 5. GCS motor subscore is 6.  Skin: Skin is warm and dry. No abrasion and no rash noted.  Psychiatric: She has a normal mood and affect. Her speech is normal and behavior is normal.  Nursing note and vitals reviewed.    ED Treatments / Results  Labs (all labs ordered are listed, but only abnormal results are displayed) Labs Reviewed - No data to display  EKG  EKG Interpretation None       Radiology No results found.  Procedures Procedures (including critical care time)  Medications Ordered in  ED Medications  acetaminophen (TYLENOL) tablet 650 mg (not administered)     Initial Impression / Assessment and Plan / ED Course  I have reviewed the triage vital signs and the nursing notes.  Pertinent labs & imaging results that were available during my care of the  patient were reviewed by me and considered in my medical decision making (see chart for details).     Patient medicated with Tylenol. Patient with nondisplaced left humeral head fracture.  We placed into a sling and given or the referral.  Final Clinical Impressions(s) / ED Diagnoses   Final diagnoses:  None    ED Discharge Orders    None       Lorre Nick, MD 01/01/18 2012

## 2018-01-01 NOTE — ED Notes (Signed)
[facility] notified of need for transport. 

## 2018-01-01 NOTE — ED Notes (Signed)
Report to Terec[place] at [facility]

## 2018-01-01 NOTE — ED Triage Notes (Signed)
Pt arrived to [facility] via [facility] from [facility] after falling out of bed. Denies LOC. Patient is on warfarin, but has not had her night dose yet. She took her morning dose this morning. A/O x4. Patient complaining of left shoulder pain and left ear pain after falling on it when she rolled out of bed. Left shoulder is extremely tender to the touch.

## 2018-01-22 IMAGING — DX DG CHEST 1V PORT
1 series · 1 of 1 positions shown · non-contrast
Comparison: 05/02/2016

CLINICAL DATA: Diabetic ketoacidosis.

EXAM:
PORTABLE CHEST 1 VIEW

[chest ap]
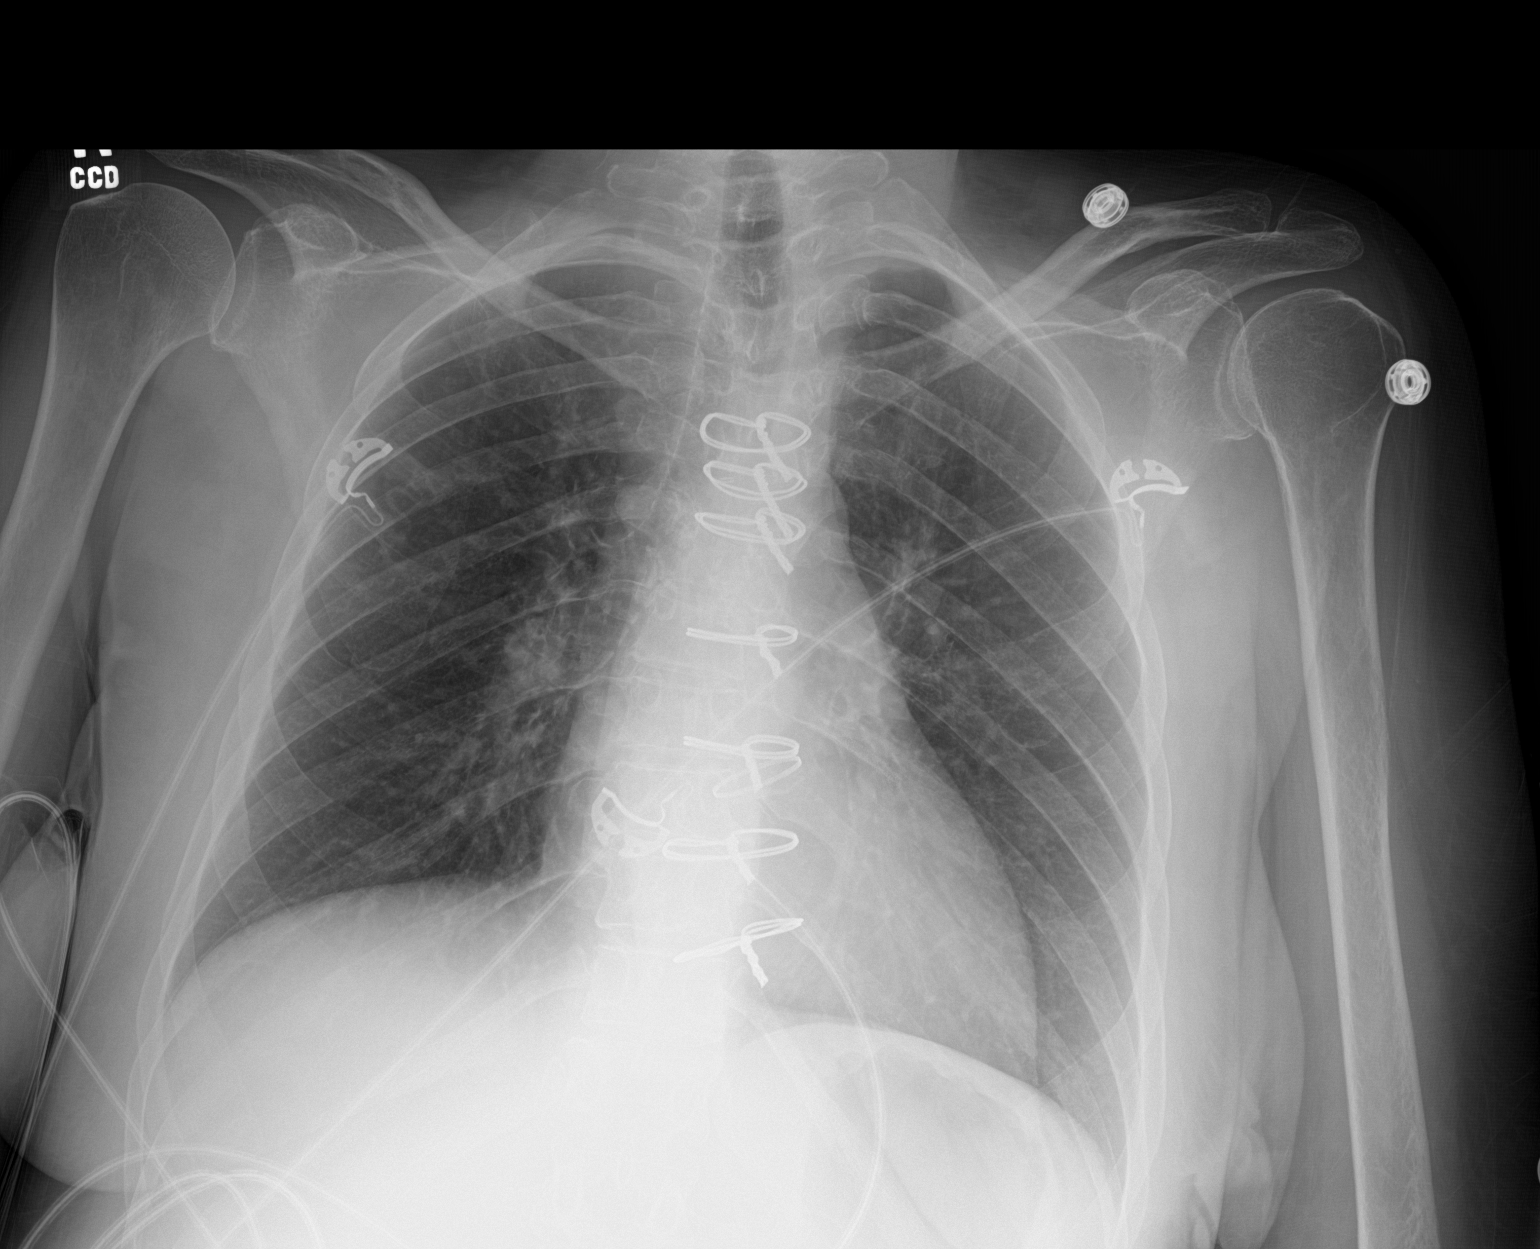

[1 of 1 positions shown; findings below may reference images not displayed]

FINDINGS: Sternotomy wires are unchanged. Interval removal of nasogastric tube
and endotracheal tube. Lungs are adequately inflated and otherwise
clear. Cardiomediastinal silhouette and remainder of the exam is
unchanged.
IMPRESSION: No active disease.

## 2018-04-21 ENCOUNTER — Other Ambulatory Visit: Payer: Self-pay

## 2018-04-21 DIAGNOSIS — I739 Peripheral vascular disease, unspecified: Secondary | ICD-10-CM

## 2018-04-22 ENCOUNTER — Ambulatory Visit (HOSPITAL_COMMUNITY)
Admission: RE | Admit: 2018-04-22 | Discharge: 2018-04-22 | Disposition: A | Discharge status: Home / Self Care | Payer: Medicare Other | Source: Ambulatory Visit | Attending: Vascular Surgery | Admitting: Vascular Surgery

## 2018-04-22 DIAGNOSIS — R0989 Other specified symptoms and signs involving the circulatory and respiratory systems: Secondary | ICD-10-CM | POA: Insufficient documentation

## 2018-04-22 DIAGNOSIS — I739 Peripheral vascular disease, unspecified: Secondary | ICD-10-CM | POA: Diagnosis not present

## 2018-04-22 DIAGNOSIS — R9389 Abnormal findings on diagnostic imaging of other specified body structures: Secondary | ICD-10-CM | POA: Insufficient documentation

## 2018-04-22 DIAGNOSIS — E1151 Type 2 diabetes mellitus with diabetic peripheral angiopathy without gangrene: Secondary | ICD-10-CM | POA: Diagnosis not present

## 2018-04-28 ENCOUNTER — Other Ambulatory Visit: Payer: Self-pay

## 2018-04-28 ENCOUNTER — Encounter: Payer: Self-pay | Admitting: *Deleted

## 2018-04-28 ENCOUNTER — Ambulatory Visit (INDEPENDENT_AMBULATORY_CARE_PROVIDER_SITE_OTHER): Payer: Medicare Other | Admitting: Vascular Surgery

## 2018-04-28 ENCOUNTER — Other Ambulatory Visit: Payer: Self-pay | Admitting: *Deleted

## 2018-04-28 ENCOUNTER — Encounter: Payer: Self-pay | Admitting: Vascular Surgery

## 2018-04-28 DIAGNOSIS — L97909 Non-pressure chronic ulcer of unspecified part of unspecified lower leg with unspecified severity: Secondary | ICD-10-CM

## 2018-04-28 DIAGNOSIS — I70299 Other atherosclerosis of native arteries of extremities, unspecified extremity: Secondary | ICD-10-CM | POA: Diagnosis not present

## 2018-04-28 NOTE — Progress Notes (Signed)
Requested by:  Renford Dills, MD [address] [place], [place] [address]  Reason for consultation: left foot skin changes   History of Present Illness   Madison [name] is a 57 y.o. (08-22-1961) female s/p multiple CVA who presents with chief complaint: left foot pain.  Patient's recollection of events is limited.  Chronology demonstrates some juxtaposition.  She thinks onset of symptoms occurred in the last few months.  Pain is described as aching in left foot, severity 5-10/10, and associated with rest and ambulation.  The patient notes she walks unaided, but was in the wheelchair today.  The patient changed repeatedly her triggers for pain, first noting pain with ambulation and then denying it.  Patient has attempted to treat this pain with rest.  The patient has intermittent rest pain symptoms.  The patient has no left foot ulcers or gangrene.  Atherosclerotic risk factors include: IDDM, HTN, HLD.  Past Medical History:  Diagnosis Date  . Allergy   . Anxiety   . Aortic stenosis   . CAD (coronary artery disease)   . CHF (congestive heart failure) (HCC)   . Depression   . Diabetes (HCC) dx'd 1982   "went straight to insulin"  . Dyslipidemia   . GERD (gastroesophageal reflux disease)   . Heart murmur   . HTN (hypertension)   . Hypercholesteremia   . Myocardial infarction (HCC) 12/2014  . Obesity   . Pneumonia 11/2014; 12/2014  . Rheumatoid arthritis(714.0)    "hands" (07/01/2015)  . Stroke syndrome 1995; 2001   "when my son was born; problems w/speech and L hand since then" (01/19/2015)  . Thrombophlebitis     Past Surgical History:  Procedure Laterality Date  . AORTIC VALVE REPLACEMENT (AVR)/CORONARY ARTERY BYPASS GRAFTING (CABG)  "2014"   [name] 03/08/2014  . CARDIAC CATHETERIZATION  12/08/2014  . CATARACT EXTRACTION Left   . CESAREAN SECTION  1995  . CORONARY ANGIOPLASTY WITH STENT PLACEMENT  12/09/2014  . CORONARY ARTERY BYPASS GRAFT    . FOOT FRACTURE  SURGERY    . FRACTURE SURGERY    . INTRAMEDULLARY (IM) NAIL INTERTROCHANTERIC Left 11/30/2016   Procedure: INTRAMEDULLARY (IM) NAIL LEFT HIP;  Surgeon: Yolonda Kida, MD;  Location: [facility] OR;  Service: Orthopedics;  Laterality: Left;  . KNEE ARTHROSCOPY Right   . LEFT HEART CATHETERIZATION WITH CORONARY ANGIOGRAM N/A 12/08/2014   Procedure: LEFT HEART CATHETERIZATION WITH CORONARY ANGIOGRAM;  Surgeon: Iran Ouch, MD;  Location: [facility] CATH LAB;  Service: Cardiovascular;  Laterality: N/A;  . NEUROPLASTY / TRANSPOSITION MEDIAN NERVE AT CARPAL TUNNEL    . ORIF ANKLE FRACTURE Left 07/04/2015   Procedure: OPEN REDUCTION INTERNAL FIXATION (ORIF)  TRIMAL ANKLE FRACTURE;  Surgeon: [name], MD;  Location: [facility] OR;  Service: Orthopedics;  Laterality: Left;  . PERCUTANEOUS CORONARY STENT INTERVENTION (PCI-S) N/A 12/09/2014   Procedure: PERCUTANEOUS CORONARY STENT INTERVENTION (PCI-S);  Surgeon: [name], MD;  Location: [facility] CATH LAB;  Service: Cardiovascular;  Laterality: N/A;  . TUBAL LIGATION  1995    Social History   Socioeconomic History  . Marital status: Divorced    Spouse name: Not on file  . Number of children: Not on file  . Years of education: Not on file  . Highest education level: Not on file  Occupational History  . Not on file  Social Needs  . Financial resource strain: Not on file  . Food insecurity:    Worry: Not on file  Inability: Not on file  . Transportation needs:    Medical: Not on file    Non-medical: Not on file  Tobacco Use  . Smoking status: Never Smoker  . Smokeless tobacco: Never Used  Substance and Sexual Activity  . Alcohol use: No    Alcohol/week: 0.0 oz  . Drug use: No  . Sexual activity: Never  Lifestyle  . Physical activity:    Days per week: Not on file    Minutes per session: Not on file  . Stress: Not on file  Relationships  . Social connections:    Talks on phone: Not on file    Gets together: Not on file    Attends religious  service: Not on file    Active member of club or organization: Not on file    Attends meetings of clubs or organizations: Not on file    Relationship status: Not on file  . Intimate partner violence:    Fear of current or ex partner: Not on file    Emotionally abused: Not on file    Physically abused: Not on file    Forced sexual activity: Not on file  Other Topics Concern  . Not on file  Social History Narrative   Divroced and lives at home.    Has two sons who live in [place] at this time. (21&8 y/o).    Has a cat    Walks a lot.       She is having great success with her diet       Working with exercises that were given to her by PT/OT.     Family History  Problem Relation Age of Onset  . Heart disease Mother   . Heart disease Sister   . Stroke Unknown   . Breast cancer Neg Hx     Current Outpatient Medications  Medication Sig Dispense Refill  . acetaminophen (TYLENOL) 325 MG tablet Take 2 tablets (650 mg total) by mouth every 6 (six) hours as needed for mild pain (or Fever >/= 101).    . benzonatate (TESSALON) 100 MG capsule Take 100 mg by mouth 3 (three) times daily as needed for cough.    Marland Kitchen CALCITONIN, SALMON, IJ Inject as directed.    . docusate sodium (COLACE) 100 MG capsule Take 100 mg by mouth 2 (two) times daily.    . ergocalciferol (VITAMIN D2) 50000 units capsule Take 50,000 Units by mouth every Wednesday.     . gabapentin (NEURONTIN) 300 MG capsule Take 300 mg by mouth at bedtime.    Marland Kitchen guaiFENesin (ROBITUSSIN) 100 MG/5ML liquid Take 200 mg by mouth 3 (three) times daily as needed for cough.    Marland Kitchen HYDROcodone-acetaminophen (NORCO/VICODIN) 5-325 MG tablet Take 1-2 tablets by mouth every 4 (four) hours as needed. 15 tablet 0  . insulin aspart (NOVOLOG) 100 UNIT/ML injection Inject 13 Units into the skin 3 (three) times daily before meals. 10 mL 11  . insulin detemir (LEVEMIR) 100 UNIT/ML injection Inject 0.2 mLs (20 Units total) into the skin at bedtime. 10 mL 11  .  lisinopril (PRINIVIL,ZESTRIL) 2.5 MG tablet Take 2.5 mg by mouth daily.    . Menthol, Topical Analgesic, (BIOFREEZE) 4 % GEL Apply 4 % topically 4 (four) times daily. Arthritis in shoulder    . omeprazole (PRILOSEC) 40 MG capsule Take 40 mg by mouth daily before breakfast.    . polyethylene glycol (MIRALAX / GLYCOLAX) packet Take 17 g by mouth daily as needed for mild  constipation.    . pravastatin (PRAVACHOL) 20 MG tablet Take 40 mg by mouth every evening.     . solifenacin (VESICARE) 5 MG tablet Take 5 mg by mouth daily.    . traZODone (DESYREL) 50 MG tablet Take 50 mg by mouth at bedtime.    . vitamin C (ASCORBIC ACID) 500 MG tablet Take 500 mg by mouth daily.    . citalopram (CELEXA) 40 MG tablet Take 1 tablet (40 mg total) by mouth daily. (Patient not taking: Reported on 04/28/2018) 90 tablet 3  . loratadine (CLARITIN) 10 MG tablet Take 10 mg by mouth daily.    Marland Kitchen warfarin (COUMADIN) 2 MG tablet Take 1 tablet (2 mg total) by mouth one time only at 6 PM. For INR 2-3, recheck INR on 1/18 (Patient taking differently: Take 5 mg by mouth one time only at 6 PM. For INR 2-3, recheck INR on 1/18)  0   No current facility-administered medications for this visit.     Allergies  Allergen Reactions  . Tape Other (See Comments)    Reaction:  Burning  Reaction:Burning  Can burn skin if left on too long.  . Celebrex [Celecoxib] Rash  . Detrol [Tolterodine] Hives    REVIEW OF SYSTEMS (negative unless checked):   Cardiac:  []  Chest pain or chest pressure? []  Shortness of breath upon activity? []  Shortness of breath when lying flat? []  Irregular heart rhythm?  Vascular:  [x]  Pain in calf, thigh, or hip brought on by walking? [x]  Pain in feet at night that wakes you up from your sleep? []  Blood clot in your veins? []  Leg swelling?  Pulmonary:  []  Oxygen at home? []  Productive cough? []  Wheezing?  Neurologic:  [x]  Sudden weakness in arms or legs? [x]  Sudden numbness in arms or  legs? [x]  Sudden onset of difficult speaking or slurred speech? []  Temporary loss of vision in one eye? []  Problems with dizziness?  Gastrointestinal:  []  Blood in stool? []  Vomited blood?  Genitourinary:  []  Burning when urinating? []  Blood in urine?  Psychiatric:  []  Major depression  Hematologic:  []  Bleeding problems? []  Problems with blood clotting?  Dermatologic:  []  Rashes or ulcers?  Constitutional:  []  Fever or chills?  Ear/Nose/Throat:  []  Change in hearing? []  Nose bleeds? []  Sore throat?  Musculoskeletal:  []  Back pain? []  Joint pain? []  Muscle pain?   For VQI Use Only   PRE-ADM LIVING Nursing home  AMB STATUS Wheelchair  CAD Sx None  PRIOR CHF None  STRESS TEST No    Physical Examination     Vitals:   04/28/18 1237  BP: 119/71  Pulse: 75  Resp: 16  Temp: (!) 97.1 F (36.2 C)  TempSrc: Oral  SpO2: 99%  Weight: 190 lb (86.2 kg)  Height: 5\' 3"  (1.6 m)   Body mass index is 33.66 kg/m.  General Alert, O x 3, WD, Ill appearing  Head [place]/AT,    Ear/Nose/ Throat Hearing grossly intact, nares without erythema or drainage, oropharynx without Erythema or Exudate, Mallampati score: 3,   Eyes PERRLA, EOMI,    Neck Supple, mid-line trachea,    Pulmonary Sym exp, good B air movt, CTA B  Cardiac RRR, Nl S1, S2, Murmur present: HSM, No rubs, No S3,S4  Vascular Vessel Right Left  Radial Palpable Palpable  Brachial Palpable Palpable  Carotid Palpable, No Bruit Palpable, No Bruit  Aorta Not palpable N/A  Femoral Palpable Palpable  Popliteal Not palpable Not palpable  PT Not palpable Not palpable  DP Not palpable Not palpable    Gastro- intestinal soft, non-distended, non-tender to palpation, No guarding or rebound, no HSM, no masses, no CVAT B, No palpable prominent aortic pulse,    Musculo- skeletal M/S 5/5 throughout except L arm contracture  , Extremities without ischemic changes except for early skin changes suggestive of possible early  ulcer development L great toe  , No edema present, No visible varicosities , No Lipodermatosclerosis present, L forefoot with dependent rubor  Neurologic Cranial nerves 2-12 intact, Pain and light touch intact in extremities, Motor exam as listed above, none fluid speech  Psychiatric Judgement intact, Mood & affect appropriate for pt's clinical situation  Dermatologic See M/S exam for extremity exam, No rashes otherwise noted  Lymphatic  Palpable lymph nodes: None    Non-Invasive Vascular Imaging   ABI (04/22/18)  R:   ABI: [place],   PT: mono  DP: bi  TBI:  0.54  L:   ABI: [place],   PT: mono  DP: mono  TBI: absent   Outside Studies/Documentation   4 pages of outside documents were reviewed including: SNF records.   Medical Decision Making   Aerika G Asencio is a 57 y.o. female who presents with: LLE critical limb ischemia, IDDM, prior CVA   Non-invasive in diabetics are of limited accuracy due to medial calcification.  L foot 's exam is suspicious for severe PAD.    L great toe is suspicious for early ulcer develop.  Wash L great toe daily with soap and water and cushion the toe.  I discussed with the patient the natural history of critical limb ischemia: 25% require amputation in one year, 50% are able to maintain their limbs in one year, and 25-30% die in one year due to comorbidities.  Given the limb threatening status of this patient, I recommend an aggressive work up including proceeding with an: Aortogram, Bilateral runoff and possible intervention Left foot . I discussed with the patient the nature of angiographic procedures, especially the limited patencies of any endovascular intervention. . The patient is aware of that the risks of an angiographic procedure include but are not limited to: bleeding, infection, access site complications, embolization, rupture of treated vessel, dissection, possible need for emergent surgical intervention, and possible need for surgical  procedures to treat the patient's pathology. . The patient is aware of the risks and agrees to proceed.  The procedure is scheduled for: 20 JUN 19. . Pt is on Coumadin so will need to hold +/- Lovenox bridge given AVR..  I discussed in depth with the patient the nature of atherosclerosis, and emphasized the importance of maximal medical management including strict control of blood pressure, blood glucose, and lipid levels, antiplatelet agents, obtaining regular exercise, and cessation of smoking.  The patient is aware that without maximal medical management the underlying atherosclerotic disease process will progress, limiting the benefit of any interventions.  The patient is currently on a statin: Pravachol.   The patient is currently not on an anti-platelet due to bleeding risks related to use of an anticoagulant.   Pt is on coumadin  Thank you for allowing us to participate in this patient's care.   Brian Chen, MD, FACS [facility] Office: [phone] Pager: [phone]  04/28/2018, 1:12 PM    

## 2018-04-28 NOTE — Progress Notes (Signed)
Call to nurse [facility] and reviewed all pre-procedure instructions, coumadin hold and insulin adjustment guideline sheet. Also left a message for LAURA the Supervisor regarding the above.

## 2018-04-28 NOTE — H&P (View-Only) (Signed)
Requested by:  Renford Dills, MD [address] [place], [place] [address]  Reason for consultation: left foot skin changes   History of Present Illness   Madison [name] is a 57 y.o. (08-22-1961) female s/p multiple CVA who presents with chief complaint: left foot pain.  Patient's recollection of events is limited.  Chronology demonstrates some juxtaposition.  She thinks onset of symptoms occurred in the last few months.  Pain is described as aching in left foot, severity 5-10/10, and associated with rest and ambulation.  The patient notes she walks unaided, but was in the wheelchair today.  The patient changed repeatedly her triggers for pain, first noting pain with ambulation and then denying it.  Patient has attempted to treat this pain with rest.  The patient has intermittent rest pain symptoms.  The patient has no left foot ulcers or gangrene.  Atherosclerotic risk factors include: IDDM, HTN, HLD.  Past Medical History:  Diagnosis Date  . Allergy   . Anxiety   . Aortic stenosis   . CAD (coronary artery disease)   . CHF (congestive heart failure) (HCC)   . Depression   . Diabetes (HCC) dx'd 1982   "went straight to insulin"  . Dyslipidemia   . GERD (gastroesophageal reflux disease)   . Heart murmur   . HTN (hypertension)   . Hypercholesteremia   . Myocardial infarction (HCC) 12/2014  . Obesity   . Pneumonia 11/2014; 12/2014  . Rheumatoid arthritis(714.0)    "hands" (07/01/2015)  . Stroke syndrome 1995; 2001   "when my son was born; problems w/speech and L hand since then" (01/19/2015)  . Thrombophlebitis     Past Surgical History:  Procedure Laterality Date  . AORTIC VALVE REPLACEMENT (AVR)/CORONARY ARTERY BYPASS GRAFTING (CABG)  "2014"   [name] 03/08/2014  . CARDIAC CATHETERIZATION  12/08/2014  . CATARACT EXTRACTION Left   . CESAREAN SECTION  1995  . CORONARY ANGIOPLASTY WITH STENT PLACEMENT  12/09/2014  . CORONARY ARTERY BYPASS GRAFT    . FOOT FRACTURE  SURGERY    . FRACTURE SURGERY    . INTRAMEDULLARY (IM) NAIL INTERTROCHANTERIC Left 11/30/2016   Procedure: INTRAMEDULLARY (IM) NAIL LEFT HIP;  Surgeon: Yolonda Kida, MD;  Location: [facility];  Service: Orthopedics;  Laterality: Left;  . KNEE ARTHROSCOPY Right   . LEFT HEART CATHETERIZATION WITH CORONARY ANGIOGRAM N/A 12/08/2014   Procedure: LEFT HEART CATHETERIZATION WITH CORONARY ANGIOGRAM;  Surgeon: Iran Ouch, MD;  Location: [facility] CATH LAB;  Service: Cardiovascular;  Laterality: N/A;  . NEUROPLASTY / TRANSPOSITION MEDIAN NERVE AT CARPAL TUNNEL    . ORIF ANKLE FRACTURE Left 07/04/2015   Procedure: OPEN REDUCTION INTERNAL FIXATION (ORIF)  TRIMAL ANKLE FRACTURE;  Surgeon: [name], MD;  Location: [facility] OR;  Service: Orthopedics;  Laterality: Left;  . PERCUTANEOUS CORONARY STENT INTERVENTION (PCI-S) N/A 12/09/2014   Procedure: PERCUTANEOUS CORONARY STENT INTERVENTION (PCI-S);  Surgeon: [name], MD;  Location: [facility] CATH LAB;  Service: Cardiovascular;  Laterality: N/A;  . TUBAL LIGATION  1995    Social History   Socioeconomic History  . Marital status: Divorced    Spouse name: Not on file  . Number of children: Not on file  . Years of education: Not on file  . Highest education level: Not on file  Occupational History  . Not on file  Social Needs  . Financial resource strain: Not on file  . Food insecurity:    Worry: Not on file  Inability: Not on file  . Transportation needs:    Medical: Not on file    Non-medical: Not on file  Tobacco Use  . Smoking status: Never Smoker  . Smokeless tobacco: Never Used  Substance and Sexual Activity  . Alcohol use: No    Alcohol/week: 0.0 oz  . Drug use: No  . Sexual activity: Never  Lifestyle  . Physical activity:    Days per week: Not on file    Minutes per session: Not on file  . Stress: Not on file  Relationships  . Social connections:    Talks on phone: Not on file    Gets together: Not on file    Attends religious  service: Not on file    Active member of club or organization: Not on file    Attends meetings of clubs or organizations: Not on file    Relationship status: Not on file  . Intimate partner violence:    Fear of current or ex partner: Not on file    Emotionally abused: Not on file    Physically abused: Not on file    Forced sexual activity: Not on file  Other Topics Concern  . Not on file  Social History Narrative   Divroced and lives at home.    Has two sons who live in [place] at this time. (21&8 y/o).    Has a cat    Walks a lot.       She is having great success with her diet       Working with exercises that were given to her by PT/OT.     Family History  Problem Relation Age of Onset  . Heart disease Mother   . Heart disease Sister   . Stroke Unknown   . Breast cancer Neg Hx     Current Outpatient Medications  Medication Sig Dispense Refill  . acetaminophen (TYLENOL) 325 MG tablet Take 2 tablets (650 mg total) by mouth every 6 (six) hours as needed for mild pain (or Fever >/= 101).    . benzonatate (TESSALON) 100 MG capsule Take 100 mg by mouth 3 (three) times daily as needed for cough.    Marland Kitchen CALCITONIN, SALMON, IJ Inject as directed.    . docusate sodium (COLACE) 100 MG capsule Take 100 mg by mouth 2 (two) times daily.    . ergocalciferol (VITAMIN D2) 50000 units capsule Take 50,000 Units by mouth every Wednesday.     . gabapentin (NEURONTIN) 300 MG capsule Take 300 mg by mouth at bedtime.    Marland Kitchen guaiFENesin (ROBITUSSIN) 100 MG/5ML liquid Take 200 mg by mouth 3 (three) times daily as needed for cough.    Marland Kitchen HYDROcodone-acetaminophen (NORCO/VICODIN) 5-325 MG tablet Take 1-2 tablets by mouth every 4 (four) hours as needed. 15 tablet 0  . insulin aspart (NOVOLOG) 100 UNIT/ML injection Inject 13 Units into the skin 3 (three) times daily before meals. 10 mL 11  . insulin detemir (LEVEMIR) 100 UNIT/ML injection Inject 0.2 mLs (20 Units total) into the skin at bedtime. 10 mL 11  .  lisinopril (PRINIVIL,ZESTRIL) 2.5 MG tablet Take 2.5 mg by mouth daily.    . Menthol, Topical Analgesic, (BIOFREEZE) 4 % GEL Apply 4 % topically 4 (four) times daily. Arthritis in shoulder    . omeprazole (PRILOSEC) 40 MG capsule Take 40 mg by mouth daily before breakfast.    . polyethylene glycol (MIRALAX / GLYCOLAX) packet Take 17 g by mouth daily as needed for mild  constipation.    . pravastatin (PRAVACHOL) 20 MG tablet Take 40 mg by mouth every evening.     . solifenacin (VESICARE) 5 MG tablet Take 5 mg by mouth daily.    . traZODone (DESYREL) 50 MG tablet Take 50 mg by mouth at bedtime.    . vitamin C (ASCORBIC ACID) 500 MG tablet Take 500 mg by mouth daily.    . citalopram (CELEXA) 40 MG tablet Take 1 tablet (40 mg total) by mouth daily. (Patient not taking: Reported on 04/28/2018) 90 tablet 3  . loratadine (CLARITIN) 10 MG tablet Take 10 mg by mouth daily.    Marland Kitchen warfarin (COUMADIN) 2 MG tablet Take 1 tablet (2 mg total) by mouth one time only at 6 PM. For INR 2-3, recheck INR on 1/18 (Patient taking differently: Take 5 mg by mouth one time only at 6 PM. For INR 2-3, recheck INR on 1/18)  0   No current facility-administered medications for this visit.     Allergies  Allergen Reactions  . Tape Other (See Comments)    Reaction:  Burning  Reaction:Burning  Can burn skin if left on too long.  . Celebrex [Celecoxib] Rash  . Detrol [Tolterodine] Hives    REVIEW OF SYSTEMS (negative unless checked):   Cardiac:  []  Chest pain or chest pressure? []  Shortness of breath upon activity? []  Shortness of breath when lying flat? []  Irregular heart rhythm?  Vascular:  [x]  Pain in calf, thigh, or hip brought on by walking? [x]  Pain in feet at night that wakes you up from your sleep? []  Blood clot in your veins? []  Leg swelling?  Pulmonary:  []  Oxygen at home? []  Productive cough? []  Wheezing?  Neurologic:  [x]  Sudden weakness in arms or legs? [x]  Sudden numbness in arms or  legs? [x]  Sudden onset of difficult speaking or slurred speech? []  Temporary loss of vision in one eye? []  Problems with dizziness?  Gastrointestinal:  []  Blood in stool? []  Vomited blood?  Genitourinary:  []  Burning when urinating? []  Blood in urine?  Psychiatric:  []  Major depression  Hematologic:  []  Bleeding problems? []  Problems with blood clotting?  Dermatologic:  []  Rashes or ulcers?  Constitutional:  []  Fever or chills?  Ear/Nose/Throat:  []  Change in hearing? []  Nose bleeds? []  Sore throat?  Musculoskeletal:  []  Back pain? []  Joint pain? []  Muscle pain?   For VQI Use Only   PRE-ADM LIVING Nursing home  AMB STATUS Wheelchair  CAD Sx None  PRIOR CHF None  STRESS TEST No    Physical Examination     Vitals:   04/28/18 1237  BP: 119/71  Pulse: 75  Resp: 16  Temp: (!) 97.1 F (36.2 C)  TempSrc: Oral  SpO2: 99%  Weight: 190 lb (86.2 kg)  Height: 5\' 3"  (1.6 m)   Body mass index is 33.66 kg/m.  General Alert, O x 3, WD, Ill appearing  Head [place]/AT,    Ear/Nose/ Throat Hearing grossly intact, nares without erythema or drainage, oropharynx without Erythema or Exudate, Mallampati score: 3,   Eyes PERRLA, EOMI,    Neck Supple, mid-line trachea,    Pulmonary Sym exp, good B air movt, CTA B  Cardiac RRR, Nl S1, S2, Murmur present: HSM, No rubs, No S3,S4  Vascular Vessel Right Left  Radial Palpable Palpable  Brachial Palpable Palpable  Carotid Palpable, No Bruit Palpable, No Bruit  Aorta Not palpable N/A  Femoral Palpable Palpable  Popliteal Not palpable Not palpable  PT Not palpable Not palpable  DP Not palpable Not palpable    Gastro- intestinal soft, non-distended, non-tender to palpation, No guarding or rebound, no HSM, no masses, no CVAT B, No palpable prominent aortic pulse,    Musculo- skeletal M/S 5/5 throughout except L arm contracture  , Extremities without ischemic changes except for early skin changes suggestive of possible early  ulcer development L great toe  , No edema present, No visible varicosities , No Lipodermatosclerosis present, L forefoot with dependent rubor  Neurologic Cranial nerves 2-12 intact, Pain and light touch intact in extremities, Motor exam as listed above, none fluid speech  Psychiatric Judgement intact, Mood & affect appropriate for pt's clinical situation  Dermatologic See M/S exam for extremity exam, No rashes otherwise noted  Lymphatic  Palpable lymph nodes: None    Non-Invasive Vascular Imaging   ABI (04/22/18)  R:   ABI: [place],   PT: mono  DP: bi  TBI:  0.54  L:   ABI: [place],   PT: mono  DP: mono  TBI: absent   Outside Studies/Documentation   4 pages of outside documents were reviewed including: SNF records.   Medical Decision Making   Madison [name] is a 57 y.o. female who presents with: LLE critical limb ischemia, IDDM, prior CVA   Non-invasive in diabetics are of limited accuracy due to medial calcification.  L foot 's exam is suspicious for severe PAD.    L great toe is suspicious for early ulcer develop.  Wash L great toe daily with soap and water and cushion the toe.  I discussed with the patient the natural history of critical limb ischemia: 25% require amputation in one year, 50% are able to maintain their limbs in one year, and 25-30% die in one year due to comorbidities.  Given the limb threatening status of this patient, I recommend an aggressive work up including proceeding with an: Aortogram, Bilateral runoff and possible intervention Left foot . I discussed with the patient the nature of angiographic procedures, especially the limited patencies of any endovascular intervention. . The patient is aware of that the risks of an angiographic procedure include but are not limited to: bleeding, infection, access site complications, embolization, rupture of treated vessel, dissection, possible need for emergent surgical intervention, and possible need for surgical  procedures to treat the patient's pathology. . The patient is aware of the risks and agrees to proceed.  The procedure is scheduled for: 20 JUN 19. Marland Kitchen Pt is on Coumadin so will need to hold +/- Lovenox bridge given AVR..  I discussed in depth with the patient the nature of atherosclerosis, and emphasized the importance of maximal medical management including strict control of blood pressure, blood glucose, and lipid levels, antiplatelet agents, obtaining regular exercise, and cessation of smoking.  The patient is aware that without maximal medical management the underlying atherosclerotic disease process will progress, limiting the benefit of any interventions.  The patient is currently on a statin: Pravachol.   The patient is currently not on an anti-platelet due to bleeding risks related to use of an anticoagulant.   Pt is on coumadin  Thank you for allowing [place] to participate in this patient's care.   Leonides Sake, MD, FACS [facility] [place] Office: [phone] Pager: [phone]  04/28/2018, 1:12 PM

## 2018-05-08 ENCOUNTER — Encounter (HOSPITAL_COMMUNITY)
Admission: RE | Disposition: A | Discharge status: Home / Self Care | Payer: Self-pay | Source: Ambulatory Visit | Attending: Vascular Surgery

## 2018-05-08 ENCOUNTER — Encounter (HOSPITAL_COMMUNITY): Payer: Self-pay | Admitting: *Deleted

## 2018-05-08 ENCOUNTER — Ambulatory Visit (HOSPITAL_COMMUNITY)
Admission: RE | Admit: 2018-05-08 | Discharge: 2018-05-08 | Disposition: A | Discharge status: Home / Self Care | Payer: Medicare Other | Source: Ambulatory Visit | Attending: Vascular Surgery | Admitting: Vascular Surgery

## 2018-05-08 DIAGNOSIS — I2510 Atherosclerotic heart disease of native coronary artery without angina pectoris: Secondary | ICD-10-CM | POA: Insufficient documentation

## 2018-05-08 DIAGNOSIS — Z8701 Personal history of pneumonia (recurrent): Secondary | ICD-10-CM | POA: Diagnosis not present

## 2018-05-08 DIAGNOSIS — I509 Heart failure, unspecified: Secondary | ICD-10-CM | POA: Insufficient documentation

## 2018-05-08 DIAGNOSIS — Z7901 Long term (current) use of anticoagulants: Secondary | ICD-10-CM | POA: Insufficient documentation

## 2018-05-08 DIAGNOSIS — Z794 Long term (current) use of insulin: Secondary | ICD-10-CM | POA: Diagnosis not present

## 2018-05-08 DIAGNOSIS — E669 Obesity, unspecified: Secondary | ICD-10-CM | POA: Diagnosis not present

## 2018-05-08 DIAGNOSIS — I70299 Other atherosclerosis of native arteries of extremities, unspecified extremity: Secondary | ICD-10-CM | POA: Diagnosis present

## 2018-05-08 DIAGNOSIS — E11621 Type 2 diabetes mellitus with foot ulcer: Secondary | ICD-10-CM | POA: Insufficient documentation

## 2018-05-08 DIAGNOSIS — K219 Gastro-esophageal reflux disease without esophagitis: Secondary | ICD-10-CM | POA: Insufficient documentation

## 2018-05-08 DIAGNOSIS — Z8249 Family history of ischemic heart disease and other diseases of the circulatory system: Secondary | ICD-10-CM | POA: Insufficient documentation

## 2018-05-08 DIAGNOSIS — I7025 Atherosclerosis of native arteries of other extremities with ulceration: Secondary | ICD-10-CM | POA: Insufficient documentation

## 2018-05-08 DIAGNOSIS — Z9842 Cataract extraction status, left eye: Secondary | ICD-10-CM | POA: Diagnosis not present

## 2018-05-08 DIAGNOSIS — I252 Old myocardial infarction: Secondary | ICD-10-CM | POA: Insufficient documentation

## 2018-05-08 DIAGNOSIS — I350 Nonrheumatic aortic (valve) stenosis: Secondary | ICD-10-CM | POA: Insufficient documentation

## 2018-05-08 DIAGNOSIS — E7800 Pure hypercholesterolemia, unspecified: Secondary | ICD-10-CM | POA: Diagnosis not present

## 2018-05-08 DIAGNOSIS — Z886 Allergy status to analgesic agent status: Secondary | ICD-10-CM | POA: Insufficient documentation

## 2018-05-08 DIAGNOSIS — L97529 Non-pressure chronic ulcer of other part of left foot with unspecified severity: Secondary | ICD-10-CM | POA: Insufficient documentation

## 2018-05-08 DIAGNOSIS — Z952 Presence of prosthetic heart valve: Secondary | ICD-10-CM | POA: Insufficient documentation

## 2018-05-08 DIAGNOSIS — Z79899 Other long term (current) drug therapy: Secondary | ICD-10-CM | POA: Insufficient documentation

## 2018-05-08 DIAGNOSIS — Z955 Presence of coronary angioplasty implant and graft: Secondary | ICD-10-CM | POA: Diagnosis not present

## 2018-05-08 DIAGNOSIS — M069 Rheumatoid arthritis, unspecified: Secondary | ICD-10-CM | POA: Diagnosis not present

## 2018-05-08 DIAGNOSIS — Z951 Presence of aortocoronary bypass graft: Secondary | ICD-10-CM | POA: Diagnosis not present

## 2018-05-08 DIAGNOSIS — Z8673 Personal history of transient ischemic attack (TIA), and cerebral infarction without residual deficits: Secondary | ICD-10-CM | POA: Insufficient documentation

## 2018-05-08 DIAGNOSIS — I110 Hypertensive heart disease with heart failure: Secondary | ICD-10-CM | POA: Diagnosis not present

## 2018-05-08 DIAGNOSIS — Z823 Family history of stroke: Secondary | ICD-10-CM | POA: Insufficient documentation

## 2018-05-08 DIAGNOSIS — L97909 Non-pressure chronic ulcer of unspecified part of unspecified lower leg with unspecified severity: Secondary | ICD-10-CM | POA: Diagnosis present

## 2018-05-08 DIAGNOSIS — Z9889 Other specified postprocedural states: Secondary | ICD-10-CM | POA: Insufficient documentation

## 2018-05-08 DIAGNOSIS — I70245 Atherosclerosis of native arteries of left leg with ulceration of other part of foot: Secondary | ICD-10-CM

## 2018-05-08 DIAGNOSIS — Z888 Allergy status to other drugs, medicaments and biological substances status: Secondary | ICD-10-CM | POA: Insufficient documentation

## 2018-05-08 HISTORY — PX: LOWER EXTREMITY ANGIOGRAPHY: CATH118251

## 2018-05-08 HISTORY — PX: PERIPHERAL VASCULAR BALLOON ANGIOPLASTY: CATH118281

## 2018-05-08 HISTORY — PX: PERIPHERAL VASCULAR INTERVENTION: CATH118257

## 2018-05-08 LAB — POCT ACTIVATED CLOTTING TIME
Activated Clotting Time: 279 seconds
Activated Clotting Time: 263 seconds
Activated Clotting Time: 202 seconds
Activated Clotting Time: 180 seconds

## 2018-05-08 LAB — POCT I-STAT, CHEM 8
BUN: 14 mg/dL (ref 6–20)
CALCIUM ION: 1.14 mmol/L — AB (ref 1.15–1.40)
CHLORIDE: 104 mmol/L (ref 101–111)
Creatinine, Ser: 0.7 mg/dL (ref 0.44–1.00)
GLUCOSE: 232 mg/dL — AB (ref 65–99)
HCT: 34 % — ABNORMAL LOW (ref 36.0–46.0)
HEMOGLOBIN: 11.6 g/dL — AB (ref 12.0–15.0)
Potassium: 4 mmol/L (ref 3.5–5.1)
SODIUM: 138 mmol/L (ref 135–145)
TCO2: 23 mmol/L (ref 22–32)

## 2018-05-08 LAB — GLUCOSE, CAPILLARY: GLUCOSE-CAPILLARY: 81 mg/dL (ref 65–99)

## 2018-05-08 LAB — PROTIME-INR
INR: 1.05
Prothrombin Time: 13.6 seconds (ref 11.4–15.2)

## 2018-05-08 SURGERY — LOWER EXTREMITY ANGIOGRAPHY
Anesthesia: LOCAL

## 2018-05-08 MED ORDER — LIDOCAINE HCL (PF) 1 % IJ SOLN
INTRAMUSCULAR | Status: AC
  Filled 2018-05-08: qty 30

## 2018-05-08 MED ORDER — CLOPIDOGREL BISULFATE 75 MG PO TABS: 75.0000 mg | ORAL_TABLET | Freq: Every day | ORAL | 11 refills | Status: AC

## 2018-05-08 MED ORDER — CLOPIDOGREL BISULFATE 300 MG PO TABS
ORAL_TABLET | ORAL | Status: DC | PRN
  Administered 2018-05-08: 300 mg via ORAL

## 2018-05-08 MED ORDER — HEPARIN SODIUM (PORCINE) 1000 UNIT/ML IJ SOLN
INTRAMUSCULAR | Status: DC | PRN
  Administered 2018-05-08: 9000 [IU] via INTRAVENOUS

## 2018-05-08 MED ORDER — MIDAZOLAM HCL 2 MG/2ML IJ SOLN
INTRAMUSCULAR | Status: DC | PRN
  Administered 2018-05-08: 1 mg via INTRAVENOUS

## 2018-05-08 MED ORDER — SODIUM CHLORIDE 0.9% FLUSH: 3.0000 mL | INTRAVENOUS | Status: DC | PRN

## 2018-05-08 MED ORDER — CLOPIDOGREL BISULFATE 300 MG PO TABS
ORAL_TABLET | ORAL | Status: AC
  Filled 2018-05-08: qty 1

## 2018-05-08 MED ORDER — ONDANSETRON HCL 4 MG/2ML IJ SOLN
4.0000 mg | Freq: Four times a day (QID) | INTRAMUSCULAR | Status: DC | PRN
Start: 2018-05-08 — End: 2018-05-08

## 2018-05-08 MED ORDER — ACETAMINOPHEN 325 MG PO TABS: 650.0000 mg | ORAL_TABLET | ORAL | Status: DC | PRN

## 2018-05-08 MED ORDER — LIDOCAINE HCL (PF) 1 % IJ SOLN
INTRAMUSCULAR | Status: DC | PRN
  Administered 2018-05-08: 25 mL

## 2018-05-08 MED ORDER — IODIXANOL 320 MG/ML IV SOLN
INTRAVENOUS | Status: DC | PRN
  Administered 2018-05-08: 135 mL via INTRA_ARTERIAL

## 2018-05-08 MED ORDER — SODIUM CHLORIDE 0.9 % IV SOLN: 250.0000 mL | INTRAVENOUS | Status: DC | PRN

## 2018-05-08 MED ORDER — HEPARIN (PORCINE) IN NACL 1000-0.9 UT/500ML-% IV SOLN
INTRAVENOUS | Status: AC
  Filled 2018-05-08: qty 1000

## 2018-05-08 MED ORDER — HEPARIN SODIUM (PORCINE) 1000 UNIT/ML IJ SOLN
INTRAMUSCULAR | Status: AC
  Filled 2018-05-08: qty 1

## 2018-05-08 MED ORDER — MIDAZOLAM HCL 2 MG/2ML IJ SOLN
INTRAMUSCULAR | Status: AC
  Filled 2018-05-08: qty 2

## 2018-05-08 MED ORDER — SODIUM CHLORIDE 0.9% FLUSH: 3.0000 mL | Freq: Two times a day (BID) | INTRAVENOUS | Status: DC

## 2018-05-08 MED ORDER — OXYCODONE HCL 5 MG PO TABS
5.0000 mg | ORAL_TABLET | Freq: Four times a day (QID) | ORAL | Status: DC | PRN
  Administered 2018-05-08: 5 mg via ORAL
  Filled 2018-05-08 (×2): qty 1

## 2018-05-08 MED ORDER — SODIUM CHLORIDE 0.9 % WEIGHT BASED INFUSION: 1.0000 mL/kg/h | INTRAVENOUS | Status: DC

## 2018-05-08 MED ORDER — LABETALOL HCL 5 MG/ML IV SOLN: 10.0000 mg | INTRAVENOUS | Status: DC | PRN

## 2018-05-08 MED ORDER — HEPARIN (PORCINE) IN NACL 2-0.9 UNITS/ML
INTRAMUSCULAR | Status: AC | PRN
  Administered 2018-05-08: 1000 mL via INTRA_ARTERIAL

## 2018-05-08 MED ORDER — FENTANYL CITRATE (PF) 100 MCG/2ML IJ SOLN
INTRAMUSCULAR | Status: DC | PRN
  Administered 2018-05-08: 50 ug via INTRAVENOUS

## 2018-05-08 MED ORDER — CLOPIDOGREL BISULFATE 300 MG PO TABS: 300.0000 mg | ORAL_TABLET | Freq: Once | ORAL | Status: DC

## 2018-05-08 MED ORDER — SODIUM CHLORIDE 0.9 % IV SOLN: INTRAVENOUS | Status: DC

## 2018-05-08 MED ORDER — HYDRALAZINE HCL 20 MG/ML IJ SOLN: 5.0000 mg | INTRAMUSCULAR | Status: DC | PRN

## 2018-05-08 MED ORDER — FENTANYL CITRATE (PF) 100 MCG/2ML IJ SOLN
INTRAMUSCULAR | Status: AC
  Filled 2018-05-08: qty 2

## 2018-05-08 SURGICAL SUPPLY — 23 items
BALLN COYOTE OTW 3X60X150 (BALLOONS) ×3
BALLN COYOTE OTW 4X80X150 (BALLOONS) ×3
BALLOON COYOTE OTW 3X60X150 (BALLOONS) ×2 IMPLANT
BALLOON COYOTE OTW 4X80X150 (BALLOONS) ×2 IMPLANT
CATH NAVICROSS ANGLED 135CM (MICROCATHETER) ×3 IMPLANT
CATH OMNI FLUSH 5F 65CM (CATHETERS) ×3 IMPLANT
COVER PRB 48X5XTLSCP FOLD TPE (BAG) ×2 IMPLANT
COVER PROBE 5X48 (BAG) ×1
DEVICE CONTINUOUS FLUSH (MISCELLANEOUS) ×3 IMPLANT
DEVICE TORQUE .025-.038 (MISCELLANEOUS) ×3 IMPLANT
GUIDEWIRE ANGLED .035X260CM (WIRE) ×3 IMPLANT
KIT ENCORE 26 ADVANTAGE (KITS) ×3 IMPLANT
KIT MICROPUNCTURE NIT STIFF (SHEATH) ×3 IMPLANT
KIT PV (KITS) ×3 IMPLANT
SHEATH PINNACLE 5F 10CM (SHEATH) ×3 IMPLANT
SHEATH PINNACLE ST 6F 45CM (SHEATH) ×3 IMPLANT
STENT INNOVA 5X40X130 (Permanent Stent) ×3 IMPLANT
SYR MEDRAD MARK V 150ML (SYRINGE) ×3 IMPLANT
TRANSDUCER W/STOPCOCK (MISCELLANEOUS) ×3 IMPLANT
TRAY PV CATH (CUSTOM PROCEDURE TRAY) ×3 IMPLANT
WIRE BENTSON .035X145CM (WIRE) ×3 IMPLANT
WIRE ROSEN-J .035X260CM (WIRE) ×3 IMPLANT
WIRE SPARTACORE .014X300CM (WIRE) ×3 IMPLANT

## 2018-05-08 NOTE — Interval H&P Note (Signed)
   History and Physical Update  The patient was interviewed and re-examined.  The patient's previous History and Physical has been reviewed and is unchanged from my consult.  There is no change in the plan of care: aortogram, bilateral leg runoff and possible intervention left foot.   I discussed with the patient the nature of angiographic procedures, especially the limited patencies of any endovascular intervention.    The patient is aware of that the risks of an angiographic procedure include but are not limited to: bleeding, infection, access site complications, renal failure, embolization, rupture of vessel, dissection, arteriovenous fistula, possible need for emergent surgical intervention, possible need for surgical procedures to treat the patient's pathology, anaphylactic reaction to contrast, and stroke and death.    The patient is aware of the risks and agrees to proceed.   Leonides Sake, MD, FACS [facility] [place] Office: [phone] Pager: [phone]  05/08/2018, 7:28 AM

## 2018-05-08 NOTE — Discharge Instructions (Signed)

## 2018-05-08 NOTE — Op Note (Signed)
OPERATIVE NOTE   PROCEDURE: 1.  Right common femoral artery cannulation under ultrasound guidance 2.  Placement of catheter in aorta 3.  Aortogram 4.  Left second order arterial selection 5.  Left leg runoff via catheter 6.  Angioplasty of left anterior tibial artery (3 mm x 60 mm) 7.  Angioplasty and stenting left posterior tibial artery (4 mm x 80 mm balloon, 5 mm x 40 mm balloon) 8.  Angioplasty of left tibioperoneal trunk (4 mm x 80 mm) 9.  Right leg runoff via sheath 10.  Conscious sedation for 88 minutes  PRE-OPERATIVE DIAGNOSIS: left great toe ulceration  POST-OPERATIVE DIAGNOSIS: same as above   SURGEON: Adele Barthel, MD  ANESTHESIA: conscious sedation  ESTIMATED BLOOD LOSS: 50 cc  CONTRAST: 135 cc  FINDING(S):  Aorta: patent  Superior mesenteric artery: not visualized Celiac artery: patent   Right Left  RA patent Patent, co-dominant renal arteries  CIA patent patent  EIA patent patent  IIA patent patent  CFA patent patent  SFA patent patent  PFA patent patent  Pop Patent proximally, occluded distally Patent proximally, occluded distally: resolved with angioplasty and stenting  Trif Occluded proximally, patent distally Occluded proximally, patent distally  AT Reconstituted from collaterals, dominant runoff Reconstituted from collaterals, proximal remained occluded after stenting popliteal artery, collateral reconstitution of artery remains    Pero Reconstituted from collaterals Patent  PT Reconstituted from collaterals, miniscule Patent   SPECIMEN(S):  none  INDICATIONS:   Madison [name] is a 57 y.o. female who presents with pre-ulcer skin changes in left foot with atypical rest pain.  The patient presents for: aortogram, bilateral leg runoff, and possible left leg intervention.  I discussed with the patient the nature of angiographic procedures, especially the limited patencies of any endovascular intervention.  The patient is aware of that the risks of  an angiographic procedure include but are not limited to: bleeding, infection, access site complications, renal failure, embolization, rupture of vessel, dissection, possible need for emergent surgical intervention, possible need for surgical procedures to treat the patient's pathology, and stroke and death.  The patient is aware of the risks and agrees to proceed.  DESCRIPTION: After full informed consent was obtained from the patient, the patient was brought back to the angiography suite.  The patient was placed supine upon the angiography table and connected to cardiopulmonary monitoring equipment.  The patient was then given conscious sedation, the amounts of which are documented in the patient's chart.  A circulating radiologic technician maintained continuous monitoring of the patient's cardiopulmonary status.  Additionally, the control room radiologic technician provided backup monitoring throughout the procedure.  The patient was prepped and drape in the standard fashion for an angiographic procedure.  At this point, attention was turned to the right groin.  Under ultrasound guidance, the subcutaneous tissue surrounding the right common femoral artery was anesthesized with 1% lidocaine with epinephrine.  Under Sonosite guidance, the patency of the artery was noted to be: widely patent.  Ultrasound image was permanently recorded.  The artery was then cannulated with a micropuncture needle.  The microwire was advanced into the iliac arterial system.  The needle was exchanged for a microsheath, which was loaded into the common femoral artery over the wire.  The microwire was exchanged for a Bentson wire which was advanced into the aorta.  The microsheath was then exchanged for a 5-Fr sheath which was loaded into the common femoral artery.  The Omniflush catheter was then loaded over the wire  up to the level of L1.  The catheter was connected to the power injector circuit.  After de-airring and  de-clotting the circuit, a power injector aortogram was completed.  The findings are listed above.    Using a Bentson wire and Omniflush catheter, the left common iliac artery was selected.  The catheter and wire were advanced into the external iliac artery.  An automated left leg runoff was completed.  The findings are listed above.  Based on the images, I felt an attempt at revascularization of the tibial arteries was indicated.    The wire was exchanged for a Rosen wire.  The patient's right femoral sheath was exchanged for a 6-Fr Destination sheath, which was lodged in the left superficial femoral artery.  The dilator was removed.  The patient was given 9000 units of Heparin intravenously, which was a therapeutic bolus.    I loaded a Navicross catheter over the wire and easily got to the level of the left popliteal artery occlusion.  I did a hand injection to reimage the tibial arteries.  I exchanged the wire for a Glidewire.  Using the Navicross and Glidewire, I dissected my way through the occluded popliteal artery in a subintimal plane.  The wire preferentially selected the left anterior tibial artery.  I did a hand injection to verify re-entry.  I exchanged the wire for a 0.014" Spartacore wire, which I placed in the proximal anterior tibial artery.  A 3 mm x 60 mm balloon was loaded over the wire and centered on the occluded segment.  This was inflated to 12 atm for 2 minutes and then deflated and removed.  The completion imaged demonstrated residual proximal anterior tibial artery stenosis and a dissection in the below-the-knee popliteal artery artery.  I felt stenting this segment was going to be needed.  I put the Navicross over the wire and then pulled the wire and catheter back into the popliteal artery.  The wire preferentially was selecting the anterior tibial artery so I exchanged the wire for a Glidewire.  I used the Glidewire with the Navicross to select out what I thought was the  tibioperoneal trunk.  I did a hand injection to demonstrate re-entry into the tibioperoneal trunk proximally.  I placed the 0.014" Spartacore down into the peroneal artery.  I placed a 4 mm x 80 mm angioplasty balloon, centering on the below-the-knee popliteal artery, extending into the tibioperoneal trunk.  This was inflated to 12 atm for 3 minutes and then deflated to 4 atm for 2 minutes.  I removed the balloon and did a hand injection, which demonstrated some residual dissection.  Given this patient's tenuous status, I did not feel I could risk thrombosis from the residual dissection.  I marked the segments of the popliteal artery and tibioperoneal trunk that looked dissection free.  Based on the measurements, I selected a self expanding 5 mm x 40 mm stent.  I deployed this stent to covered the entire dissected segment.  The stent deployment system was removed.  I then loaded the 4 mm x 80 mm balloon and inflated it in the stent at 12 atm for 1 minute, leaving the excess length in the popliteal artery.  I deflated the balloon and removed it.  Completion angiogram demonstrated: resolution of below-the-knee popliteal artery with rapid filling of posterior tibial artery and peroneal artery.  The proximal anterior tibial artery appeared occluded but the proximal 1/3 of the artery reconstituted rapidly from collaterals.  I did a  distal injection which demonstrated no embolization.  I removed the wire and then pulled the sheath back into the right external iliac artery.  The sheath was aspirated.  No clots were present and the sheath was reloaded with heparinized saline.  The right sheath was connected to the power injector circuit.  An automated right leg runoff was completed.  The findings are listed above.   Will plan on letting patient recover for 2 weeks and planning on working on the right leg in 2 weeks.  The sheath was aspirated.  No clots were present and the sheath was reloaded with heparinized saline.     ACT at end of the case was >260 seconds.  The sheath will be removed once the patient's anticoagulation reverses.     COMPLICATIONS: none  CONDITION: stable   Adele Barthel, MD, [facility] [place] Office: [phone] Pager: [phone]  05/08/2018, 10:49 AM

## 2018-05-08 NOTE — Progress Notes (Signed)
Site area: Right groin a 6 french arterial sheath was removed  Site Prior to Removal:  Level 0  Pressure Applied For 20 MINUTES    Bedrest Beginning at 1310p  Manual:   Yes.    Patient Status During Pull:  stable  Post Pull Groin Site:  Level 0  Post Pull Instructions Given:  Yes.    Post Pull Pulses Present:  Yes.    Dressing Applied:  Yes.    Comments:  VS remain stable

## 2018-05-09 ENCOUNTER — Encounter: Payer: Self-pay | Admitting: *Deleted

## 2018-05-09 ENCOUNTER — Other Ambulatory Visit: Payer: Self-pay | Admitting: *Deleted

## 2018-05-09 ENCOUNTER — Encounter (HOSPITAL_COMMUNITY): Payer: Self-pay | Admitting: Vascular Surgery

## 2018-05-09 NOTE — Progress Notes (Signed)
Letter of pre-procedure instructions faxed to [facility]. Reviewed all instructions including holding Couamdin, insulin adjustment and medication day of surgery with Nursing Supervisor LAURA. They will arrange transportation for patient.

## 2018-05-20 ENCOUNTER — Other Ambulatory Visit: Payer: Self-pay | Admitting: Internal Medicine

## 2018-05-20 DIAGNOSIS — Z1231 Encounter for screening mammogram for malignant neoplasm of breast: Secondary | ICD-10-CM

## 2018-06-05 ENCOUNTER — Encounter (HOSPITAL_COMMUNITY): Payer: Self-pay

## 2018-06-05 ENCOUNTER — Ambulatory Visit (HOSPITAL_COMMUNITY)
Admission: RE | Admit: 2018-06-05 | Discharge: 2018-06-05 | Disposition: A | Discharge status: Home / Self Care | Payer: Medicare Other | Source: Ambulatory Visit | Attending: Vascular Surgery | Admitting: Vascular Surgery

## 2018-06-05 ENCOUNTER — Encounter (HOSPITAL_COMMUNITY)
Admission: RE | Disposition: A | Discharge status: Home / Self Care | Payer: Self-pay | Source: Ambulatory Visit | Attending: Vascular Surgery

## 2018-06-05 DIAGNOSIS — I2510 Atherosclerotic heart disease of native coronary artery without angina pectoris: Secondary | ICD-10-CM | POA: Diagnosis not present

## 2018-06-05 DIAGNOSIS — I110 Hypertensive heart disease with heart failure: Secondary | ICD-10-CM | POA: Diagnosis not present

## 2018-06-05 DIAGNOSIS — I252 Old myocardial infarction: Secondary | ICD-10-CM | POA: Diagnosis not present

## 2018-06-05 DIAGNOSIS — Z9842 Cataract extraction status, left eye: Secondary | ICD-10-CM | POA: Diagnosis not present

## 2018-06-05 DIAGNOSIS — K219 Gastro-esophageal reflux disease without esophagitis: Secondary | ICD-10-CM | POA: Diagnosis not present

## 2018-06-05 DIAGNOSIS — Z6832 Body mass index (BMI) 32.0-32.9, adult: Secondary | ICD-10-CM | POA: Insufficient documentation

## 2018-06-05 DIAGNOSIS — I70201 Unspecified atherosclerosis of native arteries of extremities, right leg: Secondary | ICD-10-CM | POA: Diagnosis not present

## 2018-06-05 DIAGNOSIS — E669 Obesity, unspecified: Secondary | ICD-10-CM | POA: Diagnosis not present

## 2018-06-05 DIAGNOSIS — E11621 Type 2 diabetes mellitus with foot ulcer: Secondary | ICD-10-CM | POA: Diagnosis not present

## 2018-06-05 DIAGNOSIS — Z8701 Personal history of pneumonia (recurrent): Secondary | ICD-10-CM | POA: Diagnosis not present

## 2018-06-05 DIAGNOSIS — Z886 Allergy status to analgesic agent status: Secondary | ICD-10-CM | POA: Insufficient documentation

## 2018-06-05 DIAGNOSIS — E7800 Pure hypercholesterolemia, unspecified: Secondary | ICD-10-CM | POA: Insufficient documentation

## 2018-06-05 DIAGNOSIS — L97909 Non-pressure chronic ulcer of unspecified part of unspecified lower leg with unspecified severity: Secondary | ICD-10-CM | POA: Diagnosis present

## 2018-06-05 DIAGNOSIS — Z823 Family history of stroke: Secondary | ICD-10-CM | POA: Insufficient documentation

## 2018-06-05 DIAGNOSIS — Z8249 Family history of ischemic heart disease and other diseases of the circulatory system: Secondary | ICD-10-CM | POA: Insufficient documentation

## 2018-06-05 DIAGNOSIS — I70299 Other atherosclerosis of native arteries of extremities, unspecified extremity: Secondary | ICD-10-CM | POA: Diagnosis present

## 2018-06-05 DIAGNOSIS — Z9851 Tubal ligation status: Secondary | ICD-10-CM | POA: Diagnosis not present

## 2018-06-05 DIAGNOSIS — M069 Rheumatoid arthritis, unspecified: Secondary | ICD-10-CM | POA: Insufficient documentation

## 2018-06-05 DIAGNOSIS — Z9889 Other specified postprocedural states: Secondary | ICD-10-CM | POA: Insufficient documentation

## 2018-06-05 DIAGNOSIS — Z888 Allergy status to other drugs, medicaments and biological substances status: Secondary | ICD-10-CM | POA: Insufficient documentation

## 2018-06-05 DIAGNOSIS — Z951 Presence of aortocoronary bypass graft: Secondary | ICD-10-CM | POA: Diagnosis not present

## 2018-06-05 DIAGNOSIS — Z952 Presence of prosthetic heart valve: Secondary | ICD-10-CM | POA: Diagnosis not present

## 2018-06-05 DIAGNOSIS — I708 Atherosclerosis of other arteries: Secondary | ICD-10-CM | POA: Diagnosis present

## 2018-06-05 DIAGNOSIS — L97529 Non-pressure chronic ulcer of other part of left foot with unspecified severity: Secondary | ICD-10-CM | POA: Diagnosis not present

## 2018-06-05 DIAGNOSIS — I350 Nonrheumatic aortic (valve) stenosis: Secondary | ICD-10-CM | POA: Insufficient documentation

## 2018-06-05 DIAGNOSIS — I509 Heart failure, unspecified: Secondary | ICD-10-CM | POA: Diagnosis not present

## 2018-06-05 DIAGNOSIS — Z955 Presence of coronary angioplasty implant and graft: Secondary | ICD-10-CM | POA: Insufficient documentation

## 2018-06-05 DIAGNOSIS — Z8673 Personal history of transient ischemic attack (TIA), and cerebral infarction without residual deficits: Secondary | ICD-10-CM | POA: Insufficient documentation

## 2018-06-05 HISTORY — PX: LOWER EXTREMITY ANGIOGRAPHY: CATH118251

## 2018-06-05 HISTORY — PX: PERIPHERAL VASCULAR INTERVENTION: CATH118257

## 2018-06-05 LAB — POCT I-STAT, CHEM 8
BUN: 15 mg/dL (ref 6–20)
CALCIUM ION: 1.28 mmol/L (ref 1.15–1.40)
Chloride: 100 mmol/L (ref 98–111)
Creatinine, Ser: 0.9 mg/dL (ref 0.44–1.00)
Glucose, Bld: 150 mg/dL — ABNORMAL HIGH (ref 70–99)
HEMATOCRIT: 36 % (ref 36.0–46.0)
Hemoglobin: 12.2 g/dL (ref 12.0–15.0)
Potassium: 4.2 mmol/L (ref 3.5–5.1)
Sodium: 140 mmol/L (ref 135–145)
TCO2: 28 mmol/L (ref 22–32)

## 2018-06-05 LAB — POCT ACTIVATED CLOTTING TIME
ACTIVATED CLOTTING TIME: 268 s
ACTIVATED CLOTTING TIME: 186 s
Activated Clotting Time: 296 seconds
Activated Clotting Time: 208 seconds

## 2018-06-05 LAB — GLUCOSE, CAPILLARY
GLUCOSE-CAPILLARY: 148 mg/dL — AB (ref 70–99)
GLUCOSE-CAPILLARY: 238 mg/dL — AB (ref 70–99)

## 2018-06-05 LAB — PROTIME-INR
INR: 0.99
PROTHROMBIN TIME: 13 s (ref 11.4–15.2)

## 2018-06-05 SURGERY — LOWER EXTREMITY ANGIOGRAPHY
Anesthesia: LOCAL | Laterality: Right

## 2018-06-05 MED ORDER — HEPARIN SODIUM (PORCINE) 1000 UNIT/ML IJ SOLN
INTRAMUSCULAR | Status: DC | PRN
  Administered 2018-06-05: 9000 [IU] via INTRAVENOUS

## 2018-06-05 MED ORDER — FENTANYL CITRATE (PF) 100 MCG/2ML IJ SOLN
INTRAMUSCULAR | Status: AC
  Filled 2018-06-05: qty 2

## 2018-06-05 MED ORDER — SODIUM CHLORIDE 0.9% FLUSH: 3.0000 mL | Freq: Two times a day (BID) | INTRAVENOUS | Status: DC

## 2018-06-05 MED ORDER — IODIXANOL 320 MG/ML IV SOLN
INTRAVENOUS | Status: DC | PRN
  Administered 2018-06-05: 100 mL via INTRA_ARTERIAL

## 2018-06-05 MED ORDER — MIDAZOLAM HCL 2 MG/2ML IJ SOLN
INTRAMUSCULAR | Status: DC | PRN
  Administered 2018-06-05: 1 mg via INTRAVENOUS

## 2018-06-05 MED ORDER — SODIUM CHLORIDE 0.9% FLUSH: 3.0000 mL | INTRAVENOUS | Status: DC | PRN

## 2018-06-05 MED ORDER — ACETAMINOPHEN 325 MG PO TABS: 650.0000 mg | ORAL_TABLET | ORAL | Status: DC | PRN

## 2018-06-05 MED ORDER — HEPARIN SODIUM (PORCINE) 1000 UNIT/ML IJ SOLN
INTRAMUSCULAR | Status: AC
  Filled 2018-06-05: qty 1

## 2018-06-05 MED ORDER — MIDAZOLAM HCL 2 MG/2ML IJ SOLN
INTRAMUSCULAR | Status: AC
  Filled 2018-06-05: qty 2

## 2018-06-05 MED ORDER — SODIUM CHLORIDE 0.9 % IV SOLN: INTRAVENOUS | Status: DC

## 2018-06-05 MED ORDER — HYDRALAZINE HCL 20 MG/ML IJ SOLN: 5.0000 mg | INTRAMUSCULAR | Status: DC | PRN

## 2018-06-05 MED ORDER — LABETALOL HCL 5 MG/ML IV SOLN
10.0000 mg | INTRAVENOUS | Status: DC | PRN
Start: 2018-06-05 — End: 2018-06-05

## 2018-06-05 MED ORDER — HEPARIN (PORCINE) IN NACL 1000-0.9 UT/500ML-% IV SOLN
INTRAVENOUS | Status: DC | PRN
  Administered 2018-06-05 (×2): 500 mL

## 2018-06-05 MED ORDER — SODIUM CHLORIDE 0.9 % WEIGHT BASED INFUSION: 1.0000 mL/kg/h | INTRAVENOUS | Status: DC

## 2018-06-05 MED ORDER — HEPARIN (PORCINE) IN NACL 1000-0.9 UT/500ML-% IV SOLN
INTRAVENOUS | Status: AC
  Filled 2018-06-05: qty 1000

## 2018-06-05 MED ORDER — LIDOCAINE HCL (PF) 1 % IJ SOLN
INTRAMUSCULAR | Status: AC
  Filled 2018-06-05: qty 30

## 2018-06-05 MED ORDER — SODIUM CHLORIDE 0.9 % IV SOLN: 250.0000 mL | INTRAVENOUS | Status: DC | PRN

## 2018-06-05 MED ORDER — FENTANYL CITRATE (PF) 100 MCG/2ML IJ SOLN
INTRAMUSCULAR | Status: DC | PRN
  Administered 2018-06-05 (×2): 50 ug via INTRAVENOUS

## 2018-06-05 MED ORDER — ONDANSETRON HCL 4 MG/2ML IJ SOLN
4.0000 mg | Freq: Four times a day (QID) | INTRAMUSCULAR | Status: DC | PRN
Start: 2018-06-05 — End: 2018-06-05

## 2018-06-05 MED ORDER — LIDOCAINE HCL (PF) 1 % IJ SOLN
INTRAMUSCULAR | Status: DC | PRN
  Administered 2018-06-05: 20 mL via INTRADERMAL

## 2018-06-05 SURGICAL SUPPLY — 24 items
BAG SNAP BAND KOVER 36X36 (MISCELLANEOUS) ×3 IMPLANT
BALLN STERLING OTW 3X40X150 (BALLOONS) ×3
BALLN STERLING OTW 4X40X135 (BALLOONS) ×3
BALLOON STERLING OTW 3X40X150 (BALLOONS) ×2 IMPLANT
BALLOON STERLING OTW 4X40X135 (BALLOONS) ×2 IMPLANT
CATH NAVICROSS ANGLED 135CM (MICROCATHETER) ×3 IMPLANT
CATH OMNI FLUSH 5F 65CM (CATHETERS) ×3 IMPLANT
COVER DOME SNAP 22 D (MISCELLANEOUS) ×3 IMPLANT
DEVICE CONTINUOUS FLUSH (MISCELLANEOUS) ×3 IMPLANT
DEVICE TORQUE .025-.038 (MISCELLANEOUS) ×3 IMPLANT
GUIDEWIRE ANGLED .035X260CM (WIRE) ×3 IMPLANT
KIT ENCORE 26 ADVANTAGE (KITS) ×3 IMPLANT
KIT MICROPUNCTURE NIT STIFF (SHEATH) ×3 IMPLANT
KIT PV (KITS) ×3 IMPLANT
SHEATH PINNACLE 5F 10CM (SHEATH) ×3 IMPLANT
SHEATH PINNACLE MP 6F 45CM (SHEATH) ×3 IMPLANT
SHEATH PROBE COVER 6X72 (BAG) ×3 IMPLANT
SHIELD RADPAD SCOOP 12X17 (MISCELLANEOUS) ×3 IMPLANT
SYR MEDRAD MARK V 150ML (SYRINGE) ×3 IMPLANT
TRANSDUCER W/STOPCOCK (MISCELLANEOUS) ×3 IMPLANT
TRAY PV CATH (CUSTOM PROCEDURE TRAY) ×3 IMPLANT
WIRE BENTSON .035X145CM (WIRE) ×3 IMPLANT
WIRE G V18X300CM (WIRE) ×3 IMPLANT
WIRE ROSEN-J .035X260CM (WIRE) ×3 IMPLANT

## 2018-06-05 NOTE — Progress Notes (Signed)
Site area: left groin fa sheath Site Prior to Removal:  Level 0 Pressure Applied For: 25 minutes Manual:   yes Patient Status During Pull:  stable Post Pull Site:  Level 0 Post Pull Instructions Given:  yes Post Pull Pulses Present: bilateral dp pulses dopplered Dressing Applied:  Gauze and tegaderm Bedrest begins @ 1350 Comments:

## 2018-06-05 NOTE — Discharge Instructions (Signed)
**Note -identified via Obfuscation** Femoral Site Care °Refer to this sheet in the next few weeks. These instructions provide you with information about caring for yourself after your procedure. Your health care provider may also give you more specific instructions. Your treatment has been planned according to current medical practices, but problems sometimes occur. Call your health care provider if you have any problems or questions after your procedure. °What can I expect after the procedure? °After your procedure, it is typical to have the following: °· Bruising at the site that usually fades within 1-2 weeks. °· Blood collecting in the tissue (hematoma) that may be painful to the touch. It should usually decrease in size and tenderness within 1-2 weeks. ° °Follow these instructions at home: °· Take medicines only as directed by your health care provider. °· You may shower 24-48 hours after the procedure or as directed by your health care provider. Remove the bandage (dressing) and gently wash the site with plain soap and water. Pat the area dry with a clean towel. Do not rub the site, because this may cause bleeding. °· Do not take baths, swim, or use a hot tub until your health care provider approves. °· Check your insertion site every day for redness, swelling, or drainage. °· Do not apply powder or lotion to the site. °· Limit use of stairs to twice a day for the first 2-3 days or as directed by your health care provider. °· Do not squat for the first 2-3 days or as directed by your health care provider. °· Do not lift over 10 lb (4.5 kg) for 5 days after your procedure or as directed by your health care provider. °· Ask your health care provider when it is okay to: °? Return to work or school. °? Resume usual physical activities or sports. °? Resume sexual activity. °· Do not drive home if you are discharged the same day as the procedure. Have someone else drive you. °· You may drive 24 hours after the procedure unless otherwise instructed by  your health care provider. °· Do not operate machinery or power tools for 24 hours after the procedure or as directed by your health care provider. °· If your procedure was done as an outpatient procedure, which means that you went home the same day as your procedure, a responsible adult should be with you for the first 24 hours after you arrive home. °· Keep all follow-up visits as directed by your health care provider. This is important. °Contact a health care provider if: °· You have a fever. °· You have chills. °· You have increased bleeding from the site. Hold pressure on the site. °Get help right away if: °· You have unusual pain at the site. °· You have redness, warmth, or swelling at the site. °· You have drainage (other than a small amount of blood on the dressing) from the site. °· The site is bleeding, and the bleeding does not stop after 30 minutes of holding steady pressure on the site. °· Your leg or foot becomes pale, cool, tingly, or numb. °This information is not intended to replace advice given to you by your health care provider. Make sure you discuss any questions you have with your health care provider. °Document Released: 07/09/2014 Document Revised: 04/12/2016 Document Reviewed: 05/25/2014 °Elsevier Interactive Patient Education © 2018 [employer]vier Inc. ° °

## 2018-06-05 NOTE — H&P (Signed)
Brief History and Physical  History of Present Illness   Madison [name] is a 57 y.o. female who presents with chief complaint: healing left great toe ulcer.  Patient underwent L BTK pop recannulation last month.  On her RRo, distal popliteal occlusion was found along with occlusion of proximal tibial arteries.  The patient presents today for attempt at recannulation of proximal tibial arteries.  Past Medical History:  Diagnosis Date  . Allergy   . Anxiety   . Aortic stenosis   . CAD (coronary artery disease)   . CHF (congestive heart failure) (HCC)   . Depression   . Diabetes (HCC) dx'd 1982   "went straight to insulin"  . Dyslipidemia   . GERD (gastroesophageal reflux disease)   . Heart murmur   . HTN (hypertension)   . Hypercholesteremia   . Myocardial infarction (HCC) 12/2014  . Obesity   . Pneumonia 11/2014; 12/2014  . Rheumatoid arthritis(714.0)    "hands" (07/01/2015)  . Stroke syndrome 1995; 2001   "when my son was born; problems w/speech and L hand since then" (01/19/2015)  . Thrombophlebitis     Past Surgical History:  Procedure Laterality Date  . AORTIC VALVE REPLACEMENT (AVR)/CORONARY ARTERY BYPASS GRAFTING (CABG)  "2014"   [name] 03/08/2014  . CARDIAC CATHETERIZATION  12/08/2014  . CATARACT EXTRACTION Left   . CESAREAN SECTION  1995  . CORONARY ANGIOPLASTY WITH STENT PLACEMENT  12/09/2014  . CORONARY ARTERY BYPASS GRAFT    . FOOT FRACTURE SURGERY    . FRACTURE SURGERY    . INTRAMEDULLARY (IM) NAIL INTERTROCHANTERIC Left 11/30/2016   Procedure: INTRAMEDULLARY (IM) NAIL LEFT HIP;  Surgeon: Yolonda Kida, MD;  Location: [facility] OR;  Service: Orthopedics;  Laterality: Left;  . KNEE ARTHROSCOPY Right   . LEFT HEART CATHETERIZATION WITH CORONARY ANGIOGRAM N/A 12/08/2014   Procedure: LEFT HEART CATHETERIZATION WITH CORONARY ANGIOGRAM;  Surgeon: Iran Ouch, MD;  Location: [facility] CATH LAB;  Service: Cardiovascular;  Laterality: N/A;  . LOWER EXTREMITY ANGIOGRAPHY N/A  05/08/2018   Procedure: LOWER EXTREMITY ANGIOGRAPHY;  Surgeon: Fransisco Hertz, MD;  Location[facility] INVASIVE CV LAB;  Service: Cardiovascular;  Laterality: N/A;  . NEUROPLASTY / TRANSPOSITION MEDIAN NERVE AT CARPAL TUNNEL    . ORIF ANKLE FRACTURE Left 07/04/2015   Procedure: OPEN REDUCTION INTERNAL FIXATION (ORIF)  TRIMAL ANKLE FRACTURE;  Surgeon: [name], MD;  Location: [facility] OR;  Service: Orthopedics;  Laterality: Left;  . PERCUTANEOUS CORONARY STENT INTERVENTION (PCI-S) N/A 12/09/2014   Procedure: PERCUTANEOUS CORONARY STENT INTERVENTION (PCI-S);  Surgeon: [name], MD;  Location: [facility] CATH LAB;  Service: Cardiovascular;  Laterality: N/A;  . PERIPHERAL VASCULAR BALLOON ANGIOPLASTY Left 05/08/2018   Procedure: PERIPHERAL VASCULAR BALLOON ANGIOPLASTY;  Surgeon: Fransisco Hertz, MD;  Location: [facility] INVASIVE CV LAB;  Service: Cardiovascular;  Laterality: Left;  Left anterior tibial, and tibioperoneal trunk  . PERIPHERAL VASCULAR INTERVENTION Left 05/08/2018   Procedure: PERIPHERAL VASCULAR INTERVENTION;  Surgeon: Fransisco Hertz, MD;  Location: [facility] INVASIVE CV LAB;  Service: Cardiovascular;  Laterality: Left;  Popliteal artery  . TUBAL LIGATION  1995    Social History   Socioeconomic History  . Marital status: Divorced    Spouse name: Not on file  . Number of children: Not on file  . Years of education: Not on file  . Highest education level: Not on file  Occupational History  . Not on file  Social Needs  . Financial resource strain: Not on file  .  Food insecurity:    Worry: Not on file    Inability: Not on file  . Transportation needs:    Medical: Not on file    Non-medical: Not on file  Tobacco Use  . Smoking status: Never Smoker  . Smokeless tobacco: Never Used  Substance and Sexual Activity  . Alcohol use: No    Alcohol/week: 0.0 oz  . Drug use: No  . Sexual activity: Never  Lifestyle  . Physical activity:    Days per week: Not on file    Minutes per session: Not on file  .  Stress: Not on file  Relationships  . Social connections:    Talks on phone: Not on file    Gets together: Not on file    Attends religious service: Not on file    Active member of club or organization: Not on file    Attends meetings of clubs or organizations: Not on file    Relationship status: Not on file  . Intimate partner violence:    Fear of current or ex partner: Not on file    Emotionally abused: Not on file    Physically abused: Not on file    Forced sexual activity: Not on file  Other Topics Concern  . Not on file  Social History Narrative   Divroced and lives at home.    Has two sons who live in [place] at this time. (21&8 y/o).    Has a cat    Walks a lot.       She is having great success with her diet       Working with exercises that were given to her by PT/OT.     Family History  Problem Relation Age of Onset  . Heart disease Mother   . Heart disease Sister   . Stroke Unknown   . Breast cancer Neg Hx     No current facility-administered medications for this encounter.     Allergies  Allergen Reactions  . Tape Other (See Comments)    Can burn skin if left on too long.  . Celebrex [Celecoxib] Rash  . Detrol [Tolterodine] Hives    Review of Systems: As listed above, otherwise negative.   Physical Examination   Vitals:   06/05/18 0659  BP: 123/68  Pulse: 86  Resp: 18  Temp: 98.5 F (36.9 C)  TempSrc: Oral  SpO2: 98%  Weight: 185 lb (83.9 kg)  Height: 5\' 3"  (1.6 m)   Body mass index is 32.77 kg/m.  General Alert, O x 3, WD, NAD  Pulmonary Sym exp, good B air movt, CTA B  Cardiac RRR, Nl S1, S2, no Murmurs, No rubs, No S3,S4  Musculo- skeletal L great toe ulcer healing, no other ischemic changes  Neurologic [name] and light touch intact in extremities,     Laboratory  See iStat   Medical Decision Making   Madison [name] is a 57 y.o. female who presents with: R distal BTK pop occlusion, proximal. tib occlusion   The patient is  scheduled for: RRo, possible intervention I discussed with the patient the nature of angiographic procedures, especially the limited patencies of any endovascular intervention.  The patient is aware of that the risks of an angiographic procedure include but are not limited to: bleeding, infection, access site complications, renal failure, embolization, rupture of vessel, dissection, possible need for emergent surgical intervention, possible need for surgical procedures to treat the patient's pathology, and stroke and death.  The patient is aware of the risks and agrees to proceed.   Leonides Sake, MD, FACS [facility] [place] Office: [phone] Pager: [phone]  06/05/2018, 7:09 AM

## 2018-06-05 NOTE — Op Note (Signed)
OPERATIVE NOTE   PROCEDURE: 1.  Left common femoral artery cannulation under ultrasound guidance 2.  Third order arterial selection 3.  Right leg runoff 4.  Angioplasty right anterior tibial artery (3 mm x 40 mm) 5.  Angioplasty right below-the-knee popliteal artery x 2 (3 mm x 40 mm, 4 mm x 40 mm) 6.  Angioplasty of right tibioperoneal trunk and peroneal artery (3 mm x 30 mm) 7.  Conscious sedation for 70 minutes  PRE-OPERATIVE DIAGNOSIS: right below-the-knee popliteal artery sub-total occlusion, proximal tibial artery sub-total occlusion  POST-OPERATIVE DIAGNOSIS: same as above   SURGEON: Adele Barthel, MD  ANESTHESIA: conscious sedation  ESTIMATED BLOOD LOSS: 50 cc  CONTRAST: 100 cc  FINDING(S):  Patent right common femoral artery, profunda femoral artery and superficial femoral artery   Patent right above-the-knee popliteal artery with tapering of below-the-knee popliteal artery to sub-total occlusion  Proximal right anterior tibial artery sub-total occlusion: calcific plaque evident, <30% residual stenosis after angioplasty, no dissection  Proximally right tibioperoneal trunk sub-total occlusion: resolved with angioplasty, no dissection  Proximal right peroneal artery underfilled due to tibioperoneal trunk sub-total occlussion: proximal wideky patent after angioplasty, dwindles distally  Patent right posterior tibial artery which is <1 mm throughout  Runoff into foot predominantly via anterior tibial artery   SPECIMEN(S):  none  INDICATIONS:   Madison [name] is a 57 y.o. female who presents with known below-the-knee popliteal artery sub-total occlusion and proximal tibial artery sub-total occlusions on prior angiogram.  The patient presents for: right leg runoff and possible intervention.  I discussed with the patient the nature of angiographic procedures, especially the limited patencies of any endovascular intervention.  The patient is aware of that the risks of an  angiographic procedure include but are not limited to: bleeding, infection, access site complications, renal failure, embolization, rupture of vessel, dissection, possible need for emergent surgical intervention, possible need for surgical procedures to treat the patient's pathology, and stroke and death.  The patient is aware of the risks and agrees to proceed.  DESCRIPTION: After full informed consent was obtained from the patient, the patient was brought back to the angiography suite.  The patient was placed supine upon the angiography table and connected to cardiopulmonary monitoring equipment.  The patient was then given conscious sedation, the amounts of which are documented in the patient's chart.  A circulating radiologic technician maintained continuous monitoring of the patient's cardiopulmonary status.  Additionally, the control room radiologic technician provided backup monitoring throughout the procedure.  The patient was prepped and drape in the standard fashion for an angiographic procedure.  At this point, attention was turned to the left groin.  Under ultrasound guidance, the subcutaneous tissue surrounding the left common femoral artery was anesthesized with 1% lidocaine with epinephrine.  Under Sonosite guidance, the patency of the artery was noted to be: patent with some anterior calcification.  Ultrasound image was permanently recorded.  The artery was then cannulated with a micropuncture needle.  The microwire was advanced into the iliac arterial system.  The needle was exchanged for a microsheath, which was loaded into the common femoral artery over the wire.  The microwire was exchanged for a Bentson wire which was advanced into the aorta.  The microsheath was then exchanged for a 5-Fr sheath which was loaded into the common femoral artery.  The Omniflush catheter was then loaded over the wire up to the level of L1.  Using a Bentson wire and Omniflush catheter, the right common iliac  artery was selected.  The catheter and wire were advanced into the external iliac artery.  The wire was removed and the catheter connected to the power injector circuit.  An automated right leg runoff was completed.  The findings are listed above.  Based on the images, I felt intervention on the below-the-knee popliteal artery and tibial arteries was indicated.  The wire was exchanged for a Rosen wire.  The patient's left femoral sheath was exchanged for a 6-Fr Destination sheath, which was lodged in the right superficial femoral artery.  The dilator was removed.  The patient was given 9000 units of Heparin intravenously, which was a therapeutic bolus.    I loaded a 0.035" Navicross catheter was loaded over the wire and then the wire exchanged for a Glidewire.  Using this combination, I got down into the below-the-knee popliteal artery.  Hand injection via the catheter verified the anatomy in the proximal tibia.  There was some difficulty getting into the anterior tibial artery so I exchanged it for a V-18.  Using this combination, I was able to get into the anterior tibial artery.  I exchanged the catheter for a 3 mm x 40 mm angioplasty balloon.  This was positioned 1 cm into the anterior tibial artery and inflated to 10 atm for 2 minutes.  I deflated the balloon and removed it.  A completion image demonstrated the below-the-knee popliteal artery had >30% residual stenosis with resolution of the proximal sub-total occlusion in the anterior tibial artery.  I then replaced the Navicross catheter and pulled the wire back into the distal popliteal artery.  Using this combination, I selected the tibioperoneal trunk and peroneal artery.  I then exchanged the catheter for 3 mm x 40 mm angioplasty balloon.  This was placed 1 cm into the peroneal artery.  The balloon was inflated at 8 atm for 2 minutes.  I deflated the balloon and removed it.  Hand injection demonstrated resolution of the tibioperoneal trunk sub-total  occlusion with patency of proximal peroneal artery with augmentation of flow.  I placed a 4 mm x 40 mm balloon into the below-the-knee popliteal artery and inflated it to 6 atm for 2 minutes.  I deflated the balloon and removed it.  Completion angiogram demonstrated resolution of the below-the-knee popliteal artery stenosis, widely patent anterior tibial artery, tibioperoneal trunk and proximal peroneal artery.  I repositioned the image intensifer distally, hand injection demonstrated: distal anterior tibial artery and peroneal artery flow with predominantly anterior tibial artery runoff into the foot.  The findings are listed above.   COMPLICATIONS: none  CONDITION: stable   Adele Barthel, MD, [facility] [place] Office: [phone] Pager: [phone]  06/05/2018, 11:08 AM

## 2018-06-06 ENCOUNTER — Encounter (HOSPITAL_COMMUNITY): Payer: Self-pay | Admitting: Vascular Surgery

## 2018-06-10 ENCOUNTER — Telehealth: Payer: Self-pay | Admitting: Vascular Surgery

## 2018-06-10 NOTE — Telephone Encounter (Signed)
sch appt spk to 06/30/18 8am ABI 845am f/u MD

## 2018-06-11 ENCOUNTER — Other Ambulatory Visit: Payer: Self-pay

## 2018-06-11 DIAGNOSIS — I739 Peripheral vascular disease, unspecified: Secondary | ICD-10-CM

## 2018-06-11 DIAGNOSIS — I70299 Other atherosclerosis of native arteries of extremities, unspecified extremity: Secondary | ICD-10-CM

## 2018-06-11 DIAGNOSIS — Z48812 Encounter for surgical aftercare following surgery on the circulatory system: Secondary | ICD-10-CM

## 2018-06-11 DIAGNOSIS — L97909 Non-pressure chronic ulcer of unspecified part of unspecified lower leg with unspecified severity: Principal | ICD-10-CM

## 2018-06-12 ENCOUNTER — Telehealth: Payer: Self-pay | Admitting: Vascular Surgery

## 2018-06-26 NOTE — Progress Notes (Signed)
Postoperative Visit (Angio)   History of Present Illness   Madison [name] is a 57 y.o. female who presents cc:  Getting stronger.  Prior procedure include: 1.  PTA R AT, BTK pop, TPT and peroneal artery (06/05/18) for sub-total occlusion found on angiography 2.  PTA L AT and TPT, PTA+S L PT (05/08/18) for L great ulcer  The patient's wounds are healed.  The patient notes resolution of lower extremity symptoms.  The patient is able to complete their activities of daily living with assistance.  The patient's current symptoms are: persistent deconditioning, requiring outpatient rehab..  Past Medical History, Past Surgical History, Social History, Family History, Medications, Allergies, and Review of Systems were reviewed on 06/30/18 are unchanged from previous evaluation on 06/05/18.  Current Outpatient Medications  Medication Sig Dispense Refill  . acetaminophen (TYLENOL) 325 MG tablet Take 2 tablets (650 mg total) by mouth every 6 (six) hours as needed for mild pain (or Fever >/= 101).    . benzonatate (TESSALON) 100 MG capsule Take 100 mg by mouth 3 (three) times daily as needed for cough.    . calcitonin, salmon, (MIACALCIN/FORTICAL) 200 UNIT/ACT nasal spray Place 1 spray into alternate nostrils daily. (0900)    . clopidogrel (PLAVIX) 75 MG tablet Take 1 tablet (75 mg total) by mouth daily. 30 tablet 11  . docusate sodium (COLACE) 100 MG capsule Take 100 mg by mouth 2 (two) times daily. (0900 & 1700)    . ergocalciferol (VITAMIN D2) 50000 units capsule Take 50,000 Units by mouth every 30 (thirty) days.     Marland Kitchen escitalopram (LEXAPRO) 20 MG tablet Take 20 mg by mouth daily. (0900)    . ferrous sulfate 325 (65 FE) MG tablet Take 325 mg by mouth daily with breakfast. (0800)    . gabapentin (NEURONTIN) 400 MG capsule Take 400 mg by mouth 3 (three) times daily. (0900, 1300, 1700)    . glucagon (GLUCAGON EMERGENCY) 1 MG injection Inject 1 mg into the muscle every 15 (fifteen) minutes as  needed (for hypoglycemia for CBG<50).    Marland Kitchen guaiFENesin-dextromethorphan (ROBITUSSIN DM) 100-10 MG/5ML syrup Take 15 mLs by mouth every 4 (four) hours as needed for cough.    Marland Kitchen HYDROcodone-acetaminophen (NORCO/VICODIN) 5-325 MG tablet Take 1-2 tablets by mouth every 4 (four) hours as needed. (Patient taking differently: Take 1 tablet by mouth 3 (three) times daily as needed (for pain.). ) 15 tablet 0  . ibandronate (BONIVA) 150 MG tablet Take 150 mg by mouth every 30 (thirty) days. Take in the morning with a full glass of water, on an empty stomach, and do not take anything else by mouth or lie down for the next 30 min.    . insulin aspart (NOVOLOG) 100 UNIT/ML injection Inject 13 Units into the skin 3 (three) times daily before meals. (Patient taking differently: Inject 12 Units into the skin 3 (three) times daily before meals. (0630, 1130, 1630)) 10 mL 11  . insulin detemir (LEVEMIR) 100 UNIT/ML injection Inject 0.2 mLs (20 Units total) into the skin at bedtime. (Patient taking differently: Inject 10-25 Units into the skin See admin instructions. Inject 25 units subcutaneously every morning and 10 units every night) 10 mL 11  . lisinopril (PRINIVIL,ZESTRIL) 2.5 MG tablet Take 2.5 mg by mouth daily. (0900)    . loratadine (CLARITIN) 10 MG tablet Take 10 mg by mouth daily. (0900)    . Melatonin 3 MG TABS Take 6 mg by mouth at bedtime. (2100)    .  pantoprazole (PROTONIX) 40 MG tablet Take 40 mg by mouth daily at 6 (six) AM. (0630)    . polyethylene glycol (MIRALAX / GLYCOLAX) packet Take 17 g by mouth daily as needed (for constipation.).     Marland Kitchen pravastatin (PRAVACHOL) 40 MG tablet Take 40 mg by mouth daily at 6 PM. (1700)    . prazosin (MINIPRESS) 2 MG capsule Take 2 mg by mouth at bedtime. (2100)    . solifenacin (VESICARE) 5 MG tablet Take 5 mg by mouth daily. (0900)    . traMADol (ULTRAM) 50 MG tablet Take 50 mg by mouth every 6 (six) hours as needed (for pain.).    Marland Kitchen traZODone (DESYREL) 50 MG tablet  Take 50 mg by mouth at bedtime. (2000)    . vitamin C (ASCORBIC ACID) 500 MG tablet Take 500 mg by mouth daily. (0900)    . warfarin (COUMADIN) 2.5 MG tablet Take 2.5 mg by mouth See admin instructions. TAKE 1 TABLET (2.5 MG) BY MOUTH ON TUESDAYS & THURSDAYS AT 1700    . warfarin (COUMADIN) 5 MG tablet Take 5 mg by mouth See admin instructions. TAKE 1 TABLET (5 MG) BY MOUTH ON MONDAYS, WEDNESDAYS, FRIDAYS, SATURDAYS, & SUNDAYS AT 1700     No current facility-administered medications for this visit.     ROS: no rest pain, L great toe ulcer healed   For VQI Use Only   PRE-ADM LIVING: Home  AMB STATUS: Ambulatory with Assistance   Physical Examination   Vitals:   06/30/18 0901  Resp: 14  Weight: 185 lb (83.9 kg)  Height: 5\' 3"  (1.6 m)   Body mass index is 32.77 kg/m.   GEN Elderly, in wheelchair VASC  Both feet without ulcer and warm, prior L great toe ulcer healed   Non-invasive Vascular Imaging   ABI (06/30/2018)  R:   ABI: [place] ([place]),   PT: damp mono  DP: bi  TBI:  0.71(0.54)  L:   ABI: [place] ([place]),   PT: bi  DP: mono  TBI: 0.64   Medical Decision Making   Madison [name] is a 57 y.o. female who presents s/p BLE endovascular interventions.  Based on his angiographic findings, this patient needs: q3 month BLE ABI and BLE arterial duplex. Patient will see Dr. 58 at next visit as he is assuming care of patient panel. I discussed in depth with the patient the nature of atherosclerosis, and emphasized the importance of maximal medical management including strict control of blood pressure, blood glucose, and lipid levels, obtaining regular exercise, and cessation of smoking.  The patient is aware that without maximal medical management the underlying atherosclerotic disease process will progress, limiting the benefit of any interventions. The patient is currently on a statin: Pravachol.  The patient is currently on an anti-platelet: Plavix.  Thank you for  allowing Chestine Spore to participate in this patient's care.   [place], MD, FACS [facility] [place] Office: [phone] Pager: [phone]

## 2018-06-30 ENCOUNTER — Ambulatory Visit (HOSPITAL_COMMUNITY)
Admission: RE | Admit: 2018-06-30 | Discharge: 2018-06-30 | Disposition: A | Discharge status: Home / Self Care | Payer: Medicare Other | Source: Ambulatory Visit | Attending: Surgery | Admitting: Surgery

## 2018-06-30 ENCOUNTER — Encounter: Payer: Self-pay | Admitting: Vascular Surgery

## 2018-06-30 ENCOUNTER — Other Ambulatory Visit: Payer: Self-pay

## 2018-06-30 ENCOUNTER — Ambulatory Visit (INDEPENDENT_AMBULATORY_CARE_PROVIDER_SITE_OTHER): Payer: Self-pay | Admitting: Vascular Surgery

## 2018-06-30 ENCOUNTER — Ambulatory Visit: Payer: Self-pay

## 2018-06-30 VITALS — Resp 14 | Ht 63.0 in | Wt 185.0 lb

## 2018-06-30 DIAGNOSIS — I70299 Other atherosclerosis of native arteries of extremities, unspecified extremity: Secondary | ICD-10-CM

## 2018-06-30 DIAGNOSIS — I70293 Other atherosclerosis of native arteries of extremities, bilateral legs: Secondary | ICD-10-CM | POA: Diagnosis not present

## 2018-06-30 DIAGNOSIS — Z48812 Encounter for surgical aftercare following surgery on the circulatory system: Secondary | ICD-10-CM | POA: Insufficient documentation

## 2018-06-30 DIAGNOSIS — L97909 Non-pressure chronic ulcer of unspecified part of unspecified lower leg with unspecified severity: Secondary | ICD-10-CM | POA: Diagnosis not present

## 2018-06-30 DIAGNOSIS — I739 Peripheral vascular disease, unspecified: Secondary | ICD-10-CM | POA: Insufficient documentation

## 2018-07-04 ENCOUNTER — Encounter: Payer: Self-pay | Admitting: *Deleted

## 2018-07-05 ENCOUNTER — Emergency Department (HOSPITAL_COMMUNITY): Payer: Medicare Other

## 2018-07-05 ENCOUNTER — Inpatient Hospital Stay (HOSPITAL_COMMUNITY): Payer: Medicare Other

## 2018-07-05 ENCOUNTER — Inpatient Hospital Stay (HOSPITAL_COMMUNITY)
Admission: EM | Admit: 2018-07-05 | Discharge: 2018-07-09 | DRG: 064 | Disposition: A | Discharge status: Skilled Nursing Facility | Payer: Medicare Other | Attending: Internal Medicine | Admitting: Internal Medicine

## 2018-07-05 ENCOUNTER — Other Ambulatory Visit: Payer: Self-pay

## 2018-07-05 ENCOUNTER — Encounter (HOSPITAL_COMMUNITY): Payer: Self-pay | Admitting: Emergency Medicine

## 2018-07-05 DIAGNOSIS — E119 Type 2 diabetes mellitus without complications: Secondary | ICD-10-CM | POA: Diagnosis present

## 2018-07-05 DIAGNOSIS — A419 Sepsis, unspecified organism: Secondary | ICD-10-CM | POA: Diagnosis present

## 2018-07-05 DIAGNOSIS — F32A Depression, unspecified: Secondary | ICD-10-CM | POA: Diagnosis present

## 2018-07-05 DIAGNOSIS — R791 Abnormal coagulation profile: Secondary | ICD-10-CM | POA: Diagnosis present

## 2018-07-05 DIAGNOSIS — Z9842 Cataract extraction status, left eye: Secondary | ICD-10-CM | POA: Diagnosis not present

## 2018-07-05 DIAGNOSIS — Z7901 Long term (current) use of anticoagulants: Secondary | ICD-10-CM | POA: Diagnosis not present

## 2018-07-05 DIAGNOSIS — F329 Major depressive disorder, single episode, unspecified: Secondary | ICD-10-CM | POA: Diagnosis present

## 2018-07-05 DIAGNOSIS — Z952 Presence of prosthetic heart valve: Secondary | ICD-10-CM

## 2018-07-05 DIAGNOSIS — Z8673 Personal history of transient ischemic attack (TIA), and cerebral infarction without residual deficits: Secondary | ICD-10-CM | POA: Diagnosis not present

## 2018-07-05 DIAGNOSIS — I219 Acute myocardial infarction, unspecified: Secondary | ICD-10-CM | POA: Diagnosis present

## 2018-07-05 DIAGNOSIS — Z79890 Hormone replacement therapy (postmenopausal): Secondary | ICD-10-CM

## 2018-07-05 DIAGNOSIS — I10 Essential (primary) hypertension: Secondary | ICD-10-CM | POA: Diagnosis present

## 2018-07-05 DIAGNOSIS — Z955 Presence of coronary angioplasty implant and graft: Secondary | ICD-10-CM | POA: Diagnosis not present

## 2018-07-05 DIAGNOSIS — Z951 Presence of aortocoronary bypass graft: Secondary | ICD-10-CM | POA: Diagnosis not present

## 2018-07-05 DIAGNOSIS — I2583 Coronary atherosclerosis due to lipid rich plaque: Secondary | ICD-10-CM | POA: Diagnosis not present

## 2018-07-05 DIAGNOSIS — IMO0001 Reserved for inherently not codable concepts without codable children: Secondary | ICD-10-CM

## 2018-07-05 DIAGNOSIS — N390 Urinary tract infection, site not specified: Secondary | ICD-10-CM | POA: Diagnosis present

## 2018-07-05 DIAGNOSIS — I639 Cerebral infarction, unspecified: Secondary | ICD-10-CM | POA: Diagnosis not present

## 2018-07-05 DIAGNOSIS — Z794 Long term (current) use of insulin: Secondary | ICD-10-CM | POA: Diagnosis not present

## 2018-07-05 DIAGNOSIS — I2510 Atherosclerotic heart disease of native coronary artery without angina pectoris: Secondary | ICD-10-CM | POA: Diagnosis present

## 2018-07-05 DIAGNOSIS — Y92129 Unspecified place in nursing home as the place of occurrence of the external cause: Secondary | ICD-10-CM

## 2018-07-05 DIAGNOSIS — Z888 Allergy status to other drugs, medicaments and biological substances status: Secondary | ICD-10-CM | POA: Diagnosis not present

## 2018-07-05 DIAGNOSIS — Z91048 Other nonmedicinal substance allergy status: Secondary | ICD-10-CM

## 2018-07-05 DIAGNOSIS — M009 Pyogenic arthritis, unspecified: Secondary | ICD-10-CM | POA: Diagnosis present

## 2018-07-05 DIAGNOSIS — Z7902 Long term (current) use of antithrombotics/antiplatelets: Secondary | ICD-10-CM

## 2018-07-05 DIAGNOSIS — H538 Other visual disturbances: Secondary | ICD-10-CM | POA: Diagnosis present

## 2018-07-05 DIAGNOSIS — W01190A Fall on same level from slipping, tripping and stumbling with subsequent striking against furniture, initial encounter: Secondary | ICD-10-CM | POA: Diagnosis present

## 2018-07-05 DIAGNOSIS — R29711 NIHSS score 11: Secondary | ICD-10-CM | POA: Diagnosis present

## 2018-07-05 DIAGNOSIS — I63531 Cerebral infarction due to unspecified occlusion or stenosis of right posterior cerebral artery: Principal | ICD-10-CM | POA: Diagnosis present

## 2018-07-05 DIAGNOSIS — I340 Nonrheumatic mitral (valve) insufficiency: Secondary | ICD-10-CM | POA: Diagnosis not present

## 2018-07-05 LAB — BASIC METABOLIC PANEL
ANION GAP: 12 (ref 5–15)
BUN: 11 mg/dL (ref 6–20)
CHLORIDE: 95 mmol/L — AB (ref 98–111)
CO2: 24 mmol/L (ref 22–32)
Calcium: 9 mg/dL (ref 8.9–10.3)
Creatinine, Ser: 1 mg/dL (ref 0.44–1.00)
GFR calc Af Amer: >60 mL/min (ref >60)
GFR calc non Af Amer: >60 mL/min (ref >60)
GLUCOSE: 326 mg/dL — AB (ref 70–99)
POTASSIUM: 4.5 mmol/L (ref 3.5–5.1)
Sodium: 131 mmol/L — ABNORMAL LOW (ref 135–145)

## 2018-07-05 LAB — URINALYSIS, ROUTINE W REFLEX MICROSCOPIC
BILIRUBIN URINE: NEGATIVE
Ketones, ur: 20 mg/dL — AB
Nitrite: NEGATIVE
PROTEIN: NEGATIVE mg/dL
Specific Gravity, Urine: 1.017 (ref 1.005–1.030)
pH: 6 (ref 5.0–8.0)

## 2018-07-05 LAB — CBC WITH DIFFERENTIAL/PLATELET
ABS IMMATURE GRANULOCYTES: 0 10*3/uL (ref 0.0–0.1)
Basophils Absolute: 0 10*3/uL (ref 0.0–0.1)
Basophils Relative: 0 %
Eosinophils Absolute: 0 10*3/uL (ref 0.0–0.7)
Eosinophils Relative: 0 %
HEMATOCRIT: 37.5 % (ref 36.0–46.0)
HEMOGLOBIN: 11.9 g/dL — AB (ref 12.0–15.0)
Immature Granulocytes: 0 %
LYMPHS ABS: 1.8 10*3/uL (ref 0.7–4.0)
LYMPHS PCT: 17 %
MCH: 29.2 pg (ref 26.0–34.0)
MCHC: 31.7 g/dL (ref 30.0–36.0)
MCV: 91.9 fL (ref 78.0–100.0)
MONO ABS: 0.5 10*3/uL (ref 0.1–1.0)
MONOS PCT: 5 %
NEUTROS ABS: 8.3 10*3/uL — AB (ref 1.7–7.7)
Neutrophils Relative %: 78 %
Platelets: 228 10*3/uL (ref 150–400)
RBC: 4.08 MIL/uL (ref 3.87–5.11)
RDW: 13.4 % (ref 11.5–15.5)
WBC: 10.7 10*3/uL — ABNORMAL HIGH (ref 4.0–10.5)

## 2018-07-05 LAB — PROTIME-INR
INR: 2.2
Prothrombin Time: 24.3 seconds — ABNORMAL HIGH (ref 11.4–15.2)

## 2018-07-05 LAB — CBG MONITORING, ED: GLUCOSE-CAPILLARY: 380 mg/dL — AB (ref 70–99)

## 2018-07-05 LAB — LACTIC ACID, PLASMA: LACTIC ACID, VENOUS: 1.3 mmol/L (ref 0.5–1.9)

## 2018-07-05 MED ORDER — ACETAMINOPHEN 650 MG RE SUPP: 650.0000 mg | RECTAL | Status: DC | PRN

## 2018-07-05 MED ORDER — DARIFENACIN HYDROBROMIDE ER 7.5 MG PO TB24
7.5000 mg | ORAL_TABLET | Freq: Every day | ORAL | Status: DC
  Administered 2018-07-06 – 2018-07-09 (×4): 7.5 mg via ORAL
  Filled 2018-07-05 (×4): qty 1

## 2018-07-05 MED ORDER — PRAVASTATIN SODIUM 40 MG PO TABS
40.0000 mg | ORAL_TABLET | Freq: Every day | ORAL | Status: DC
  Administered 2018-07-06 – 2018-07-08 (×3): 40 mg via ORAL
  Filled 2018-07-05 (×3): qty 1

## 2018-07-05 MED ORDER — ACETAMINOPHEN 160 MG/5ML PO SOLN: 650.0000 mg | ORAL | Status: DC | PRN

## 2018-07-05 MED ORDER — ESCITALOPRAM OXALATE 10 MG PO TABS
20.0000 mg | ORAL_TABLET | Freq: Every day | ORAL | Status: DC
  Administered 2018-07-06 – 2018-07-09 (×4): 20 mg via ORAL
  Filled 2018-07-05 (×4): qty 2

## 2018-07-05 MED ORDER — INSULIN ASPART 100 UNIT/ML ~~LOC~~ SOLN
6.0000 [IU] | Freq: Three times a day (TID) | SUBCUTANEOUS | Status: DC
  Administered 2018-07-06 – 2018-07-07 (×4): 6 [IU] via SUBCUTANEOUS

## 2018-07-05 MED ORDER — ACETAMINOPHEN 325 MG PO TABS
650.0000 mg | ORAL_TABLET | ORAL | Status: DC | PRN
  Administered 2018-07-05 – 2018-07-07 (×3): 650 mg via ORAL
  Administered 2018-07-07: 325 mg via ORAL
  Filled 2018-07-05 (×4): qty 2

## 2018-07-05 MED ORDER — GABAPENTIN 400 MG PO CAPS
400.0000 mg | ORAL_CAPSULE | Freq: Three times a day (TID) | ORAL | Status: DC
  Administered 2018-07-06 – 2018-07-09 (×11): 400 mg via ORAL
  Filled 2018-07-05 (×11): qty 1

## 2018-07-05 MED ORDER — BENZONATATE 100 MG PO CAPS: 100.0000 mg | ORAL_CAPSULE | Freq: Three times a day (TID) | ORAL | Status: DC | PRN

## 2018-07-05 MED ORDER — SODIUM CHLORIDE 0.9 % IV SOLN
1.0000 g | INTRAVENOUS | Status: DC
  Administered 2018-07-05 – 2018-07-08 (×4): 1 g via INTRAVENOUS
  Filled 2018-07-05 (×4): qty 10

## 2018-07-05 MED ORDER — INSULIN ASPART 100 UNIT/ML ~~LOC~~ SOLN
0.0000 [IU] | Freq: Three times a day (TID) | SUBCUTANEOUS | Status: DC
  Administered 2018-07-06: 3 [IU] via SUBCUTANEOUS
  Administered 2018-07-06: 5 [IU] via SUBCUTANEOUS
  Administered 2018-07-06: 1 [IU] via SUBCUTANEOUS
  Administered 2018-07-07: 2 [IU] via SUBCUTANEOUS
  Administered 2018-07-07: 1 [IU] via SUBCUTANEOUS
  Administered 2018-07-08: 3 [IU] via SUBCUTANEOUS
  Administered 2018-07-08: 5 [IU] via SUBCUTANEOUS
  Administered 2018-07-09 (×2): 3 [IU] via SUBCUTANEOUS

## 2018-07-05 MED ORDER — TRAZODONE HCL 50 MG PO TABS
50.0000 mg | ORAL_TABLET | Freq: Every day | ORAL | Status: DC
  Administered 2018-07-06 – 2018-07-08 (×4): 50 mg via ORAL
  Filled 2018-07-05 (×4): qty 1

## 2018-07-05 MED ORDER — MELATONIN 3 MG PO TABS
6.0000 mg | ORAL_TABLET | Freq: Every day | ORAL | Status: DC
  Administered 2018-07-06 – 2018-07-08 (×4): 6 mg via ORAL
  Filled 2018-07-05 (×6): qty 2

## 2018-07-05 MED ORDER — DOCUSATE SODIUM 100 MG PO CAPS
100.0000 mg | ORAL_CAPSULE | Freq: Two times a day (BID) | ORAL | Status: DC
  Administered 2018-07-06 – 2018-07-09 (×8): 100 mg via ORAL
  Filled 2018-07-05 (×8): qty 1

## 2018-07-05 MED ORDER — PANTOPRAZOLE SODIUM 40 MG PO TBEC
40.0000 mg | DELAYED_RELEASE_TABLET | Freq: Every day | ORAL | Status: DC
  Administered 2018-07-06 – 2018-07-09 (×4): 40 mg via ORAL
  Filled 2018-07-05 (×4): qty 1

## 2018-07-05 MED ORDER — SENNOSIDES-DOCUSATE SODIUM 8.6-50 MG PO TABS
1.0000 | ORAL_TABLET | Freq: Every evening | ORAL | Status: DC | PRN
  Administered 2018-07-08: 1 via ORAL
  Filled 2018-07-05: qty 1

## 2018-07-05 MED ORDER — INSULIN DETEMIR 100 UNIT/ML ~~LOC~~ SOLN: 7.0000 [IU] | SUBCUTANEOUS | Status: DC

## 2018-07-05 MED ORDER — ONDANSETRON HCL 4 MG/2ML IJ SOLN
4.0000 mg | Freq: Once | INTRAMUSCULAR | Status: AC
Start: 2018-07-05 — End: 2018-07-05
  Administered 2018-07-05: 4 mg via INTRAVENOUS
  Filled 2018-07-05: qty 2

## 2018-07-05 MED ORDER — CLOPIDOGREL BISULFATE 75 MG PO TABS
75.0000 mg | ORAL_TABLET | Freq: Every day | ORAL | Status: DC
  Administered 2018-07-06 – 2018-07-09 (×4): 75 mg via ORAL
  Filled 2018-07-05 (×4): qty 1

## 2018-07-05 MED ORDER — WARFARIN - PHARMACIST DOSING INPATIENT: Freq: Every day | Status: DC

## 2018-07-05 MED ORDER — MORPHINE SULFATE (PF) 4 MG/ML IV SOLN
4.0000 mg | Freq: Once | INTRAVENOUS | Status: AC
  Administered 2018-07-05: 4 mg via INTRAVENOUS
  Filled 2018-07-05: qty 1

## 2018-07-05 MED ORDER — WARFARIN SODIUM 7.5 MG PO TABS
7.5000 mg | ORAL_TABLET | Freq: Once | ORAL | Status: AC
  Administered 2018-07-06: 7.5 mg via ORAL
  Filled 2018-07-05: qty 1

## 2018-07-05 MED ORDER — SODIUM CHLORIDE 0.9 % IV BOLUS
1000.0000 mL | Freq: Once | INTRAVENOUS | Status: AC
Start: 2018-07-05 — End: 2018-07-05
  Administered 2018-07-05: 1000 mL via INTRAVENOUS

## 2018-07-05 MED ORDER — STROKE: EARLY STAGES OF RECOVERY BOOK
Freq: Once | Status: AC
  Administered 2018-07-06: 01:00:00
  Filled 2018-07-05: qty 1

## 2018-07-05 MED ORDER — SODIUM CHLORIDE 0.9 % IV SOLN
INTRAVENOUS | Status: AC
  Administered 2018-07-06: 01:00:00 via INTRAVENOUS

## 2018-07-05 MED ORDER — CALCITONIN (SALMON) 200 UNIT/ACT NA SOLN
1.0000 | Freq: Every day | NASAL | Status: DC
  Administered 2018-07-07 – 2018-07-09 (×3): 1 via NASAL
  Filled 2018-07-05: qty 3.7

## 2018-07-05 MED ORDER — INSULIN ASPART 100 UNIT/ML ~~LOC~~ SOLN
0.0000 [IU] | Freq: Every day | SUBCUTANEOUS | Status: DC
  Administered 2018-07-05 – 2018-07-06 (×2): 5 [IU] via SUBCUTANEOUS
  Administered 2018-07-07: 4 [IU] via SUBCUTANEOUS
  Filled 2018-07-05: qty 1

## 2018-07-05 NOTE — ED Notes (Signed)
Gave pt ginger ale and told her it was on table beside bed. Pt sts "where is it? I can't see it?" When questioned about timing of visual loss, pt states this occurred two days ago and she told the staff at her facility about it, to which they responded, "Come on Madison [name], you know you can see."

## 2018-07-05 NOTE — ED Notes (Signed)
IV team at bedside 

## 2018-07-05 NOTE — ED Notes (Signed)
Sent urine culture tube down to lab with urinalysis

## 2018-07-05 NOTE — ED Notes (Signed)
Patient transported to MRI 

## 2018-07-05 NOTE — ED Triage Notes (Signed)
Pt to ED from [facility]. Pt was in the bathroom and pushed call bell for staff to assist her back to bed. No one came so she got up on her own and fell, hitting head. No LOC. Hx stroke x5, very weak on the L side from them. A/O x4, NAD.

## 2018-07-05 NOTE — H&P (Signed)
History and Physical    Madison [name] JQB:[id number] DOB: 05-04-1961 DOA: 07/05/2018  PCP: Madison Dills, MD   Patient coming from: SNF   Chief Complaint: Fall, hit back of head   HPI: Madison [name] is a 57 y.o. female with medical history significant for aortic valve replacement on Coumadin, depression, hypertension, insulin-dependent diabetes mellitus, and multiple strokes with residual deficits, now presenting from her SNF after suffering a ground-level mechanical fall, hitting the back of her head on the floor.  Patient reports that she was on the toilet at her nursing facility, got up to return to her bed, tripped over an assistive device, and fell to the ground, hitting the back of her head on the floor.  There is no loss of consciousness.  She reports having a headache for the past 2 days, not significantly changed since the fall.  She also reports blurred vision for the past 2 days, again no different after the fall.  She has not appreciated any fever and denies any neck pain or stiffness, denies rhinorrhea or sinus congestion, and denies cough, shortness of breath, chest pain, or palpitations.  ED Course: Upon arrival to the ED, patient is found to be febrile to 38 C, saturating adequately on room air, slightly tachycardic, and with stable blood pressure.  Chest x-ray is negative for edema or consolidation.  Noncontrast head CT is concerning for new subacute infarct of the right occipital lobe with mild edema and mass-effect, but no acute hemorrhage or fracture.  Chemistry panel is notable for sodium of 131 and glucose 326.  CBC features a leukocytosis to 10,700.  Urinalysis is suggestive of infection and sample was sent for culture.  INR is 2.2.  Patient was given morphine and a liter of normal saline in the ED.  Neurology was consulted and recommended a medical admission for further evaluation and management.  Review of Systems:  All other systems reviewed and apart from HPI, are  negative.  Past Medical History:  Diagnosis Date  . Allergy   . Anxiety   . Aortic stenosis   . CAD (coronary artery disease)   . CHF (congestive heart failure) (HCC)   . Depression   . Diabetes (HCC) dx'd 1982   "went straight to insulin"  . Dyslipidemia   . GERD (gastroesophageal reflux disease)   . Heart murmur   . HTN (hypertension)   . Hypercholesteremia   . Myocardial infarction (HCC) 12/2014  . Obesity   . Pneumonia 11/2014; 12/2014  . Rheumatoid arthritis(714.0)    "hands" (07/01/2015)  . Stroke syndrome 1995; 2001   "when my son was born; problems w/speech and L hand since then" (01/19/2015)  . Thrombophlebitis     Past Surgical History:  Procedure Laterality Date  . AORTIC VALVE REPLACEMENT (AVR)/CORONARY ARTERY BYPASS GRAFTING (CABG)  "2014"   Madison [name] 03/08/2014  . CARDIAC CATHETERIZATION  12/08/2014  . CATARACT EXTRACTION Left   . CESAREAN SECTION  1995  . CORONARY ANGIOPLASTY WITH STENT PLACEMENT  12/09/2014  . CORONARY ARTERY BYPASS GRAFT    . FOOT FRACTURE SURGERY    . FRACTURE SURGERY    . INTRAMEDULLARY (IM) NAIL INTERTROCHANTERIC Left 11/30/2016   Procedure: INTRAMEDULLARY (IM) NAIL LEFT HIP;  Surgeon: Yolonda Kida, MD;  Location: [facility] OR;  Service: Orthopedics;  Laterality: Left;  . KNEE ARTHROSCOPY Right   . LEFT HEART CATHETERIZATION WITH CORONARY ANGIOGRAM N/A 12/08/2014   Procedure: LEFT HEART CATHETERIZATION WITH CORONARY ANGIOGRAM;  Surgeon: Iran Ouch, MD;  Location: [facility] CATH LAB;  Service: Cardiovascular;  Laterality: N/A;  . LOWER EXTREMITY ANGIOGRAPHY N/A 05/08/2018   Procedure: LOWER EXTREMITY ANGIOGRAPHY;  Surgeon: Fransisco Hertz, MD;  Location: [facility] INVASIVE CV LAB;  Service: Cardiovascular;  Laterality: N/A;  . LOWER EXTREMITY ANGIOGRAPHY N/A 06/05/2018   Procedure: LOWER EXTREMITY ANGIOGRAPHY - Right Leg;  Surgeon: Fransisco Hertz, MD;  Location: [facility] INVASIVE CV LAB;  Service: Cardiovascular;  Laterality: N/A;  . NEUROPLASTY / TRANSPOSITION  MEDIAN NERVE AT CARPAL TUNNEL    . ORIF ANKLE FRACTURE Left 07/04/2015   Procedure: OPEN REDUCTION INTERNAL FIXATION (ORIF)  TRIMAL ANKLE FRACTURE;  Surgeon: [name], MD;  Location: [facility] OR;  Service: Orthopedics;  Laterality: Left;  . PERCUTANEOUS CORONARY STENT INTERVENTION (PCI-S) N/A 12/09/2014   Procedure: PERCUTANEOUS CORONARY STENT INTERVENTION (PCI-S);  Surgeon: [name], MD;  Location: [facility] CATH LAB;  Service: Cardiovascular;  Laterality: N/A;  . PERIPHERAL VASCULAR BALLOON ANGIOPLASTY Left 05/08/2018   Procedure: PERIPHERAL VASCULAR BALLOON ANGIOPLASTY;  Surgeon: Fransisco Hertz, MD;  Location: [facility] INVASIVE CV LAB;  Service: Cardiovascular;  Laterality: Left;  Left anterior tibial, and tibioperoneal trunk  . PERIPHERAL VASCULAR INTERVENTION Left 05/08/2018   Procedure: PERIPHERAL VASCULAR INTERVENTION;  Surgeon: Fransisco Hertz, MD;  Location: [facility] INVASIVE CV LAB;  Service: Cardiovascular;  Laterality: Left;  Popliteal artery  . PERIPHERAL VASCULAR INTERVENTION Right 06/05/2018   Procedure: PERIPHERAL VASCULAR INTERVENTION;  Surgeon: Fransisco Hertz, MD;  Location: [facility] INVASIVE CV LAB;  Service: Cardiovascular;  Laterality: Right;  . TUBAL LIGATION  1995     reports that she has never smoked. She has never used smokeless tobacco. She reports that she does not drink alcohol or use drugs.  Allergies  Allergen Reactions  . Tape Other (See Comments)    Can burn skin if left on too long. Reaction:Burning  Can burn skin if left on too long.  . Celebrex [Celecoxib] Rash  . Detrol [Tolterodine] Hives    Family History  Problem Relation Age of Onset  . Heart disease Mother   . Heart disease Sister   . Stroke Unknown   . Breast cancer Neg Hx      Prior to Admission medications   Medication Sig Start Date End Date Taking? Authorizing Provider  acetaminophen (TYLENOL) 325 MG tablet Take 2 tablets (650 mg total) by mouth every 6 (six) hours as needed for mild pain (or Fever >/= 101).  12/04/16   Zannie Cove, MD  benzonatate (TESSALON) 100 MG capsule Take 100 mg by mouth 3 (three) times daily as needed for cough.    [provider]  calcitonin, salmon, (MIACALCIN/FORTICAL) 200 UNIT/ACT nasal spray Place 1 spray into alternate nostrils daily. (0900)    [provider]  clopidogrel (PLAVIX) 75 MG tablet Take 1 tablet (75 mg total) by mouth daily. 05/08/18 05/08/19  Fransisco Hertz, MD  docusate sodium (COLACE) 100 MG capsule Take 100 mg by mouth 2 (two) times daily. (0900 & 1700)    [provider]  ergocalciferol (VITAMIN D2) 50000 units capsule Take 50,000 Units by mouth every 30 (thirty) days.     [provider]  escitalopram (LEXAPRO) 20 MG tablet Take 20 mg by mouth daily. (0900)    [provider]  ferrous sulfate 325 (65 FE) MG tablet Take 325 mg by mouth daily with breakfast. (0800)    [provider]  gabapentin (NEURONTIN) 400 MG capsule Take 400 mg by mouth 3 (three) times daily. (0900,  1300, 1700)    [provider]  glucagon (GLUCAGON EMERGENCY) 1 MG injection Inject 1 mg into the muscle every 15 (fifteen) minutes as needed (for hypoglycemia for CBG<50).    [provider]  guaiFENesin-dextromethorphan (ROBITUSSIN DM) 100-10 MG/5ML syrup Take 15 mLs by mouth every 4 (four) hours as needed for cough.    [provider]  HYDROcodone-acetaminophen (NORCO/VICODIN) 5-325 MG tablet Take 1-2 tablets by mouth every 4 (four) hours as needed. Patient taking differently: Take 1 tablet by mouth 3 (three) times daily as needed (for pain.).  01/01/18   Lorre Nick, MD  ibandronate (BONIVA) 150 MG tablet Take 150 mg by mouth every 30 (thirty) days. Take in the morning with a [name] glass of water, on an empty stomach, and do not take anything else by mouth or lie down for the next 30 min.    [provider]  insulin aspart (NOVOLOG) 100 UNIT/ML injection Inject 13 Units into the skin 3 (three) times  daily before meals. Patient taking differently: Inject 12 Units into the skin 3 (three) times daily before meals. (0630, 1130, 1630) 07/01/17   Romero Belling, MD  insulin detemir (LEVEMIR) 100 UNIT/ML injection Inject 0.2 mLs (20 Units total) into the skin at bedtime. Patient taking differently: Inject 10-25 Units into the skin See admin instructions. Inject 25 units subcutaneously every morning and 10 units every night 07/01/17   Romero Belling, MD  lisinopril (PRINIVIL,ZESTRIL) 2.5 MG tablet Take 2.5 mg by mouth daily. (0900)    [provider]  loratadine (CLARITIN) 10 MG tablet Take 10 mg by mouth daily. (0900)    [provider]  Melatonin 3 MG TABS Take 6 mg by mouth at bedtime. (2100)    [provider]  pantoprazole (PROTONIX) 40 MG tablet Take 40 mg by mouth daily at 6 (six) AM. (0630)    [provider]  polyethylene glycol (MIRALAX / GLYCOLAX) packet Take 17 g by mouth daily as needed (for constipation.).     [provider]  pravastatin (PRAVACHOL) 40 MG tablet Take 40 mg by mouth daily at 6 PM. (1700)    [provider]  prazosin (MINIPRESS) 2 MG capsule Take 2 mg by mouth at bedtime. (2100)    [provider]  solifenacin (VESICARE) 5 MG tablet Take 5 mg by mouth daily. (0900)    [provider]  traMADol (ULTRAM) 50 MG tablet Take 50 mg by mouth every 6 (six) hours as needed (for pain.).    [provider]  traZODone (DESYREL) 50 MG tablet Take 50 mg by mouth at bedtime. (2000)    [provider]  vitamin C (ASCORBIC ACID) 500 MG tablet Take 500 mg by mouth daily. (0900)    [provider]  warfarin (COUMADIN) 2.5 MG tablet Take 2.5 mg by mouth See admin instructions. TAKE 1 TABLET (2.5 MG) BY MOUTH ON TUESDAYS & THURSDAYS AT 1700    [provider]  warfarin (COUMADIN) 5 MG tablet Take 5 mg by mouth See admin instructions. TAKE 1 TABLET (5 MG) BY MOUTH ON MONDAYS, WEDNESDAYS,  FRIDAYS, SATURDAYS, & SUNDAYS AT 1700    [provider]    Physical Exam: Vitals:   07/05/18 2000 07/05/18 2015 07/05/18 2030 07/05/18 2115  BP: 140/72 118/63 126/64 (!) 146/68  Pulse: 87 96 91 (!) 103  Resp: (!) 21  (!) 26 (!) 23  Temp:      TempSrc:      SpO2: 98%  96% 95% 93%     Constitutional: NAD, calm  Eyes: PERTLA, lids and conjunctivae normal ENMT: Mucous membranes are moist. Posterior pharynx clear of any exudate or lesions.   Neck: normal, supple, no masses, no thyromegaly Respiratory: clear to auscultation bilaterally, no wheezing, no crackles. Normal respiratory effort.    Cardiovascular: S1 & S2 heard, regular rate and rhythm. No extremity edema.   Abdomen: No distension, no tenderness, soft. Bowel sounds active.  Musculoskeletal: no clubbing / cyanosis. No joint deformity upper and lower extremities.   Skin: no significant rashes, lesions, ulcers. Warm, dry, well-perfused. Neurologic: Hearing grossly normal. Gross visual acuity deficit. No aphasia. Sensation intact. Following commands, moving all extremities.  Psychiatric: Alert and oriented to person, place, and situation. Pleasant and cooperative.    Labs on Admission: I have personally reviewed following labs and imaging studies  CBC: Recent Labs  Lab 07/05/18 1911  WBC 10.7*  NEUTROABS 8.3*  HGB 11.9*  HCT 37.5  MCV 91.9  PLT 228   Basic Metabolic Panel: Recent Labs  Lab 07/05/18 1911  NA 131*  K 4.5  CL 95*  CO2 24  GLUCOSE 326*  BUN 11  CREATININE 1.00  CALCIUM 9.0   GFR: Estimated Creatinine Clearance: 63.7 mL/min (by C-G formula based on SCr of 1 mg/dL). Liver Function Tests: No results for input(s): AST, ALT, ALKPHOS, BILITOT, PROT, ALBUMIN in the last 168 hours. No results for input(s): LIPASE, AMYLASE in the last 168 hours. No results for input(s): AMMONIA in the last 168 hours. Coagulation Profile: Recent Labs  Lab 07/05/18 1911  INR 2.20   Cardiac Enzymes: No  results for input(s): CKTOTAL, CKMB, CKMBINDEX, TROPONINI in the last 168 hours. BNP (last 3 results) No results for input(s): PROBNP in the last 8760 hours. HbA1C: No results for input(s): HGBA1C in the last 72 hours. CBG: No results for input(s): GLUCAP in the last 168 hours. Lipid Profile: No results for input(s): CHOL, HDL, LDLCALC, TRIG, CHOLHDL, LDLDIRECT in the last 72 hours. Thyroid Function Tests: No results for input(s): TSH, T4TOTAL, FREET4, T3FREE, THYROIDAB in the last 72 hours. Anemia Panel: No results for input(s): VITAMINB12, FOLATE, FERRITIN, TIBC, IRON, RETICCTPCT in the last 72 hours. Urine analysis:    Component Value Date/Time   COLORURINE YELLOW 07/05/2018 1928   APPEARANCEUR CLOUDY (A) 07/05/2018 1928   LABSPEC 1.017 07/05/2018 1928   PHURINE 6.0 07/05/2018 1928   GLUCOSEU >=500 (A) 07/05/2018 1928   HGBUR SMALL (A) 07/05/2018 1928   BILIRUBINUR NEGATIVE 07/05/2018 1928   BILIRUBINUR n 06/16/2015 1244   KETONESUR 20 (A) 07/05/2018 1928   PROTEINUR NEGATIVE 07/05/2018 1928   UROBILINOGEN 0.2 07/30/2015 1415   NITRITE NEGATIVE 07/05/2018 1928   LEUKOCYTESUR LARGE (A) 07/05/2018 1928   Sepsis Labs: @LABRCNTIP (procalcitonin:4,lacticidven:4) )No results found for this or any previous visit (from the past 240 hour(s)).   Radiological Exams on Admission: Dg Chest 2 View  Result Date: 07/05/2018 CLINICAL DATA:  Pain following fall EXAM: CHEST - 2 VIEW COMPARISON:  November 29, 2016 FINDINGS: There is no edema or consolidation. Heart size and pulmonary vascularity are normal. No adenopathy. Patient is status post aortic valve replacement. No adenopathy. No bone lesions. No pneumothorax. IMPRESSION: Status post aortic valve replacement. No edema or consolidation. Heart size normal. Electronically Signed   By: Bretta Bang III M.D.   On: 07/05/2018 18:21   Ct Head Wo Contrast  Result Date: 07/05/2018 CLINICAL DATA:  57 y/o  F; fall today.  Headache and  dizziness. EXAM: CT HEAD WITHOUT CONTRAST TECHNIQUE: Contiguous axial images were obtained from the base of the skull through the vertex without intravenous contrast. COMPARISON:  04/30/2016 CT head FINDINGS: Brain: There is a region of hypoattenuation with loss of gray-white differentiation and mild local mass effect of the right occipital lobe which is new from the prior CT of head and compatible with subacute infarction. There multiple small cortical infarctions within the bilateral frontal lobes, parietal lobes, except a lobes as well as a large chronic infarction in the right posterior MCA distribution. There are very small chronic infarctions in bilateral cerebellar hemispheres. There are advanced chronic microvascular ischemic changes of white matter and volume loss of the brain. No hemorrhage, extra-axial collection, hydrocephalus, or herniation. Vascular: Calcific atherosclerosis of the carotid siphons and vertebral arteries. No hyperdense vessel identified. Skull: Normal. Negative for fracture or focal lesion. Sinuses/Orbits: Partial left mastoid opacification. Normal aeration of the mastoid air cells with a small right maxillary sinus fluid level. Orbits are unremarkable. Other: None. IMPRESSION: 1. New subacute infarction of the right occipital lobe with mild edema and mass effect. 2. No acute hemorrhage or calvarial fracture. 3. Numerous chronic infarcts throughout the supratentorial and infratentorial brain including a large chronic right posterior MCA infarct. 4. Partial left mastoid opacification. Small right maxillary fluid level may represent acute sinusitis. Electronically Signed   By: Mitzi Hansen M.D.   On: 07/05/2018 18:39    EKG: Independently reviewed. Normal sinus rhythm.   Assessment/Plan  1. Ischemic CVA  - Presents from SNF after a fall, hitting the back of her head on floor, no LOC, and preceded by 2 days of headache and blurred vision   - Head CT concerning for new  subacute infarct involving right occipital lobe with mild edema and mass-effect, no hemorrhage or fracture  - Neurology is consulting and much appreciated, will follow-up on recommendations  - Check MRI, echocardiogram, continue cardiac monitoring, frequent neuro checks, PT/OT/SLP evals, NPO pending swallow screen, A1c and fasting lipid panel, follow-up neurology recommendations for vascular imaging and antiplatelet    2. Sepsis secondary to UTI  - Presents after a fall, reports headache, blurred vision, and malaise  - Found to have fever, tachycardia, and leukocytosis  - UA suggestive of infection and sample was sent for cultures; CXR without focal infiltrate, no URI symptoms, abd exam is benign, no meningismus  - Culture blood, check lactate, start empiric antibiotics    3. CAD - No anginal complaints  - Continue statin and Plavix   - Hold BP meds initially as above   4. Aortic valve replacement  - No hemorrhage on CT  - Continue warfarin   5. Insulin-dependent DM  - A1c was 9.8% last year  - Managed at SNF with Levemir 25 units qAM and 10 units qPM, as well as Novolog 12 units TID  - Check CBG's, continue Levemir and Novolog   6. Hypertension  - BP at goal  - Hold antihypertensives initially in light of ischemic CVA   7. Depression  - Continue Lexapro and trazodone     DVT prophylaxis: warfarin  Code Status: [name]  Family Communication: Discussed with patient Consults called: Neurology  Admission status: Inpatient; patient has multiple acute problems, including stroke and sepsis secondary to UTI that will likely require more than 2 midnights of inpatient eval and treatment to stabilize.    Briscoe Deutscher, MD [facility] Pager [phone]  If 7PM-7AM, please contact night-coverage [email or web address] Password [facility]  07/05/2018, 10:13  PM    

## 2018-07-05 NOTE — Progress Notes (Signed)
ANTICOAGULATION CONSULT NOTE - Initial Consult  Pharmacy Consult for Warfarin  Indication: Hx CVA/mechanical AVR  Allergies  Allergen Reactions  . Tape Other (See Comments)    Can burn skin if left on too long. Reaction:Burning  Can burn skin if left on too long.  . Celebrex [Celecoxib] Rash  . Detrol [Tolterodine] Hives    Vital Signs: Temp: 101.5 F (38.6 C) (08/17 2237) Temp Source: Oral (08/17 1705) BP: 146/68 (08/17 2115) Pulse Rate: 103 (08/17 2115)  Labs: Recent Labs    07/05/18 1911  HGB 11.9*  HCT 37.5  PLT 228  LABPROT 24.3*  INR 2.20  CREATININE 1.00    Estimated Creatinine Clearance: 63.7 mL/min (by C-G formula based on SCr of 1 mg/dL).   Medical History: Past Medical History:  Diagnosis Date  . Allergy   . Anxiety   . Aortic stenosis   . CAD (coronary artery disease)   . CHF (congestive heart failure) (HCC)   . Depression   . Diabetes (HCC) dx'd 1982   "went straight to insulin"  . Dyslipidemia   . GERD (gastroesophageal reflux disease)   . Heart murmur   . HTN (hypertension)   . Hypercholesteremia   . Myocardial infarction (HCC) 12/2014  . Obesity   . Pneumonia 11/2014; 12/2014  . Rheumatoid arthritis(714.0)    "hands" (07/01/2015)  . Stroke syndrome 1995; 2001   "when my son was born; problems w/speech and L hand since then" (01/19/2015)  . Thrombophlebitis     Assessment: 57 y/o F on warfarin PTA for hx CVA and mechanical AVR, pt here from a nursing facility s/p fall. CT with no bleeding, subacute infarct noted. Hgb 11.9. INR is just below goal at 2.2 (goal 2.5-3.5). Pt has not had her warfarin this evening.   Goal of Therapy:  INR 2.5-3.5 Monitor platelets by anticoagulation protocol: Yes   Plan:  Warfarin 7.5 mg PO x 1 now Daily PT/INR Monitor for bleeding   Abran Duke 07/05/2018,10:39 PM

## 2018-07-05 NOTE — ED Provider Notes (Signed)
[facility] EMERGENCY DEPARTMENT Provider Note   CSN: [id number] Arrival date & time: 07/05/18  1627     History   Chief Complaint Chief Complaint  Patient presents with  . Fall    HPI Madison [name] is a 57 y.o. female presenting from nursing home after mechanical fall approximately 20 minutes prior to arrival.  Patient states that she missed her wheelchair struck the back of her head on a wheelchair and is now endorsing occipital headache, throbbing and 5/10 in severity.  Patient states that she is on Coumadin.  Patient with history of "5 strokes in the past ".  Patient with weakness to her left side, she states that it is normal for her. Patient denies any new weakness/numbness or tingling.  Patient denies loss of consciousness.  Patient denying neck or back pain.  Patient denying chest pain or shortness of breath.  HPI  Past Medical History:  Diagnosis Date  . Allergy   . Anxiety   . Aortic stenosis   . CAD (coronary artery disease)   . CHF (congestive heart failure) (HCC)   . Depression   . Diabetes (HCC) dx'd 1982   "went straight to insulin"  . Dyslipidemia   . GERD (gastroesophageal reflux disease)   . Heart murmur   . HTN (hypertension)   . Hypercholesteremia   . Myocardial infarction (HCC) 12/2014  . Obesity   . Pneumonia 11/2014; 12/2014  . Rheumatoid arthritis(714.0)    "hands" (07/01/2015)  . Stroke syndrome 1995; 2001   "when my son was born; problems w/speech and L hand since then" (01/19/2015)  . Thrombophlebitis     Patient Active Problem List   Diagnosis Date Noted  . Atherosclerosis of artery of extremity with ulceration (HCC) 04/28/2018  . Insulin dependent diabetes mellitus (HCC) 03/07/2017  . Closed displaced intertrochanteric fracture of left femur (HCC) 11/29/2016  . Difficult airway for intubation 11/29/2016  . SIRS (systemic inflammatory response syndrome) (HCC) 05/25/2016  . MDD (major depressive disorder), recurrent  episode, moderate (HCC)   . Aspiration pneumonia (HCC) 05/17/2016  . Diabetic ketoacidosis without coma (HCC)   . Sepsis secondary to UTI (HCC)   . Acute hypoxemic respiratory failure (HCC)   . Chronic combined systolic and diastolic congestive heart failure (HCC)   . Hypoglycemia   . Chronic diastolic heart failure (HCC)   . Esophageal reflux   . S/P AVR (aortic valve replacement)   . Constipation 11/23/2015  . Pressure ulcer 08/02/2015  . Diabetic ketoacidosis with coma associated with other specified diabetes mellitus (HCC)   . Closed left ankle fracture 07/01/2015  . Trimalleolar fracture of left ankle 07/01/2015  . Essential hypertension   . Diabetes type 1, uncontrolled (HCC)   . S/P aortic valve replacement   . Blood poisoning   . Spastic hemiplegia affecting nondominant side (HCC) 11/29/2014  . Dysphagia, pharyngoesophageal phase 10/04/2014  . Depression 04/29/2014  . CVA (cerebral infarction) 03/12/2014  . Chronic anticoagulation 07/28/2013  . CAD (coronary artery disease)   . Ischemic stroke (HCC)   . Hyperlipidemia   . Rheumatoid arthritis [facility])     Past Surgical History:  Procedure Laterality Date  . AORTIC VALVE REPLACEMENT (AVR)/CORONARY ARTERY BYPASS GRAFTING (CABG)  "2014"   [name] 03/08/2014  . CARDIAC CATHETERIZATION  12/08/2014  . CATARACT EXTRACTION Left   . CESAREAN SECTION  1995  . CORONARY ANGIOPLASTY WITH STENT PLACEMENT  12/09/2014  . CORONARY ARTERY BYPASS GRAFT    . FOOT FRACTURE SURGERY    .  FRACTURE SURGERY    . INTRAMEDULLARY (IM) NAIL INTERTROCHANTERIC Left 11/30/2016   Procedure: INTRAMEDULLARY (IM) NAIL LEFT HIP;  Surgeon: Yolonda Kida, MD;  Location: [facility] OR;  Service: Orthopedics;  Laterality: Left;  . KNEE ARTHROSCOPY Right   . LEFT HEART CATHETERIZATION WITH CORONARY ANGIOGRAM N/A 12/08/2014   Procedure: LEFT HEART CATHETERIZATION WITH CORONARY ANGIOGRAM;  Surgeon: Iran Ouch, MD;  Location: [facility] CATH LAB;  Service:  Cardiovascular;  Laterality: N/A;  . LOWER EXTREMITY ANGIOGRAPHY N/A 05/08/2018   Procedure: LOWER EXTREMITY ANGIOGRAPHY;  Surgeon: Fransisco Hertz, MD;  Location: [facility] INVASIVE CV LAB;  Service: Cardiovascular;  Laterality: N/A;  . LOWER EXTREMITY ANGIOGRAPHY N/A 06/05/2018   Procedure: LOWER EXTREMITY ANGIOGRAPHY - Right Leg;  Surgeon: Fransisco Hertz, MD;  Location: [facility] INVASIVE CV LAB;  Service: Cardiovascular;  Laterality: N/A;  . NEUROPLASTY / TRANSPOSITION MEDIAN NERVE AT CARPAL TUNNEL    . ORIF ANKLE FRACTURE Left 07/04/2015   Procedure: OPEN REDUCTION INTERNAL FIXATION (ORIF)  TRIMAL ANKLE FRACTURE;  Surgeon: [name], MD;  Location: [facility] OR;  Service: Orthopedics;  Laterality: Left;  . PERCUTANEOUS CORONARY STENT INTERVENTION (PCI-S) N/A 12/09/2014   Procedure: PERCUTANEOUS CORONARY STENT INTERVENTION (PCI-S);  Surgeon: [name], MD;  Location: [facility] CATH LAB;  Service: Cardiovascular;  Laterality: N/A;  . PERIPHERAL VASCULAR BALLOON ANGIOPLASTY Left 05/08/2018   Procedure: PERIPHERAL VASCULAR BALLOON ANGIOPLASTY;  Surgeon: Fransisco Hertz, MD;  Location: [facility] INVASIVE CV LAB;  Service: Cardiovascular;  Laterality: Left;  Left anterior tibial, and tibioperoneal trunk  . PERIPHERAL VASCULAR INTERVENTION Left 05/08/2018   Procedure: PERIPHERAL VASCULAR INTERVENTION;  Surgeon: Fransisco Hertz, MD;  Location: [facility] INVASIVE CV LAB;  Service: Cardiovascular;  Laterality: Left;  Popliteal artery  . PERIPHERAL VASCULAR INTERVENTION Right 06/05/2018   Procedure: PERIPHERAL VASCULAR INTERVENTION;  Surgeon: Fransisco Hertz, MD;  Location: [facility] INVASIVE CV LAB;  Service: Cardiovascular;  Laterality: Right;  . TUBAL LIGATION  1995     OB History   None      Home Medications    Prior to Admission medications   Medication Sig Start Date End Date Taking? Authorizing Provider  acetaminophen (TYLENOL) 325 MG tablet Take 2 tablets (650 mg total) by mouth every 6 (six) hours as needed for mild pain (or Fever >/=  101). 12/04/16   Zannie Cove, MD  benzonatate (TESSALON) 100 MG capsule Take 100 mg by mouth 3 (three) times daily as needed for cough.    [provider]  calcitonin, salmon, (MIACALCIN/FORTICAL) 200 UNIT/ACT nasal spray Place 1 spray into alternate nostrils daily. (0900)    [provider]  clopidogrel (PLAVIX) 75 MG tablet Take 1 tablet (75 mg total) by mouth daily. 05/08/18 05/08/19  Fransisco Hertz, MD  docusate sodium (COLACE) 100 MG capsule Take 100 mg by mouth 2 (two) times daily. (0900 & 1700)    [provider]  ergocalciferol (VITAMIN D2) 50000 units capsule Take 50,000 Units by mouth every 30 (thirty) days.     [provider]  escitalopram (LEXAPRO) 20 MG tablet Take 20 mg by mouth daily. (0900)    [provider]  ferrous sulfate 325 (65 FE) MG tablet Take 325 mg by mouth daily with breakfast. (0800)    [provider]  gabapentin (NEURONTIN) 400 MG capsule Take 400 mg by mouth 3 (three) times daily. (0900, 1300, 1700)    [provider]  glucagon (GLUCAGON EMERGENCY) 1 MG injection Inject 1 mg into the muscle  every 15 (fifteen) minutes as needed (for hypoglycemia for CBG<50).    [provider]  guaiFENesin-dextromethorphan (ROBITUSSIN DM) 100-10 MG/5ML syrup Take 15 mLs by mouth every 4 (four) hours as needed for cough.    [provider]  HYDROcodone-acetaminophen (NORCO/VICODIN) 5-325 MG tablet Take 1-2 tablets by mouth every 4 (four) hours as needed. Patient taking differently: Take 1 tablet by mouth 3 (three) times daily as needed (for pain.).  01/01/18   Lorre Nick, MD  ibandronate (BONIVA) 150 MG tablet Take 150 mg by mouth every 30 (thirty) days. Take in the morning with a full glass of water, on an empty stomach, and do not take anything else by mouth or lie down for the next 30 min.    [provider]  insulin aspart (NOVOLOG) 100 UNIT/ML injection Inject 13 Units into the skin 3 (three)  times daily before meals. Patient taking differently: Inject 12 Units into the skin 3 (three) times daily before meals. (0630, 1130, 1630) 07/01/17   Romero Belling, MD  insulin detemir (LEVEMIR) 100 UNIT/ML injection Inject 0.2 mLs (20 Units total) into the skin at bedtime. Patient taking differently: Inject 10-25 Units into the skin See admin instructions. Inject 25 units subcutaneously every morning and 10 units every night 07/01/17   Romero Belling, MD  lisinopril (PRINIVIL,ZESTRIL) 2.5 MG tablet Take 2.5 mg by mouth daily. (0900)    [provider]  loratadine (CLARITIN) 10 MG tablet Take 10 mg by mouth daily. (0900)    [provider]  Melatonin 3 MG TABS Take 6 mg by mouth at bedtime. (2100)    [provider]  pantoprazole (PROTONIX) 40 MG tablet Take 40 mg by mouth daily at 6 (six) AM. (0630)    [provider]  polyethylene glycol (MIRALAX / GLYCOLAX) packet Take 17 g by mouth daily as needed (for constipation.).     [provider]  pravastatin (PRAVACHOL) 40 MG tablet Take 40 mg by mouth daily at 6 PM. (1700)    [provider]  prazosin (MINIPRESS) 2 MG capsule Take 2 mg by mouth at bedtime. (2100)    [provider]  solifenacin (VESICARE) 5 MG tablet Take 5 mg by mouth daily. (0900)    [provider]  traMADol (ULTRAM) 50 MG tablet Take 50 mg by mouth every 6 (six) hours as needed (for pain.).    [provider]  traZODone (DESYREL) 50 MG tablet Take 50 mg by mouth at bedtime. (2000)    [provider]  vitamin C (ASCORBIC ACID) 500 MG tablet Take 500 mg by mouth daily. (0900)    [provider]  warfarin (COUMADIN) 2.5 MG tablet Take 2.5 mg by mouth See admin instructions. TAKE 1 TABLET (2.5 MG) BY MOUTH ON TUESDAYS & THURSDAYS AT 1700    [provider]  warfarin (COUMADIN) 5 MG tablet Take 5 mg by mouth See admin instructions. TAKE 1 TABLET (5 MG) BY MOUTH ON MONDAYS, WEDNESDAYS,  FRIDAYS, SATURDAYS, & SUNDAYS AT 1700    [provider]    Family History Family History  Problem Relation Age of Onset  . Heart disease Mother   . Heart disease Sister   . Stroke Unknown   . Breast cancer Neg Hx     Social History Social History   Tobacco Use  . Smoking status: Never Smoker  . Smokeless tobacco: Never Used  Substance Use Topics  . Alcohol use: No    Alcohol/week: 0.0 standard  drinks  . Drug use: No     Allergies   Tape; Celebrex [celecoxib]; and Detrol [tolterodine]   Review of Systems Review of Systems  Constitutional: Negative.  Negative for chills and fever.  HENT: Negative.  Negative for rhinorrhea and sore throat.   Eyes: Negative.  Negative for visual disturbance.  Respiratory: Negative.  Negative for cough and shortness of breath.   Cardiovascular: Negative.  Negative for chest pain.  Gastrointestinal: Negative.  Negative for abdominal pain, blood in stool, diarrhea, nausea and vomiting.  Genitourinary: Negative.  Negative for dysuria, flank pain, hematuria and pelvic pain.  Musculoskeletal: Negative.  Negative for arthralgias, myalgias, neck pain and neck stiffness.  Skin: Negative.  Negative for rash.  Neurological: Positive for headaches. Negative for dizziness, syncope, light-headedness and numbness.       Negative for new weakness.     Physical Exam Updated Vital Signs BP (!) 146/68   Pulse (!) 103   Temp 99.6 F (37.6 C) (Oral)   Resp (!) 23   SpO2 93%   Physical Exam  Constitutional: She is oriented to person, place, and time. She appears well-developed and well-nourished. No distress.  HENT:  Head: Normocephalic.    Right Ear: External ear normal.  Left Ear: External ear normal.  Nose: Nose normal.  Mouth/Throat: Oropharynx is clear and moist.  Eyes:  Patient with anisocoria, right pupil greater than left pupil.  Patient states that this is normal for her.  Neck: Trachea normal, normal range of motion, full  passive range of motion without pain and phonation normal. Neck supple. No JVD present. No tracheal tenderness, no spinous process tenderness and no muscular tenderness present. No neck rigidity. No tracheal deviation and normal range of motion present.  Cardiovascular: Normal rate, regular rhythm, normal heart sounds and intact distal pulses.  Pulmonary/Chest: Effort normal and breath sounds normal.  Abdominal: Soft. Bowel sounds are normal. There is no tenderness. There is no rebound and no guarding.  Musculoskeletal:  Patient with contracture of the left upper extremity, she states this is due to her previous stroke.  Neurological: She is alert and oriented to person, place, and time.  Mental Status: Alert, oriented, thought content appropriate, able to give a coherent history. Speech fluent without evidence of aphasia. Able to follow 2 step commands without difficulty. Cranial Nerves: II: Anisocoria which patient states is normal for her. III,IV, VI: ptosis not present, extra-ocular motions intact bilaterally V,VII: smile symmetric, eyebrows raise symmetric, facial light touch sensation equal VIII: hearing grossly normal to voice X: uvula elevates symmetrically XI: bilateral shoulder shrug symmetric and strong XII: midline tongue extension without fassiculations Motor: Normal tone. 5/5 strength in upper right extremity; weakness of upper left extremity which is normal per patient. Patient with weak bilateral dorsi/plantar flexion which patient states is normal for her. Sensory: Sensation intact to light touch in all extremities. CV: distal pulses palpable throughout  Skin: Skin is warm and dry. Capillary refill takes less than 2 seconds.  Psychiatric: She has a normal mood and affect. Her behavior is normal.    ED Treatments / Results  Labs (all labs ordered are listed, but only abnormal results are displayed) Labs Reviewed  CBC WITH DIFFERENTIAL/PLATELET - Abnormal; Notable  for the following components:      Result Value   WBC 10.7 (*)    Hemoglobin 11.9 (*)    Neutro Abs 8.3 (*)    All other components within normal limits  BASIC METABOLIC PANEL - Abnormal; Notable  for the following components:   Sodium 131 (*)    Chloride 95 (*)    Glucose, Bld 326 (*)    All other components within normal limits  URINALYSIS, ROUTINE W REFLEX MICROSCOPIC - Abnormal; Notable for the following components:   APPearance CLOUDY (*)    Glucose, UA >=500 (*)    Hgb urine dipstick SMALL (*)    Ketones, ur 20 (*)    Leukocytes, UA LARGE (*)    Bacteria, UA MANY (*)    All other components within normal limits  PROTIME-INR - Abnormal; Notable for the following components:   Prothrombin Time 24.3 (*)    All other components within normal limits  URINE CULTURE  CULTURE, BLOOD (ROUTINE X 2)  CULTURE, BLOOD (ROUTINE X 2)  LACTIC ACID, PLASMA  LACTIC ACID, PLASMA  HEMOGLOBIN A1C  LIPID PANEL  HIV ANTIBODY (ROUTINE TESTING)    EKG None  Radiology Dg Chest 2 View  Result Date: 07/05/2018 CLINICAL DATA:  Pain following fall EXAM: CHEST - 2 VIEW COMPARISON:  November 29, 2016 FINDINGS: There is no edema or consolidation. Heart size and pulmonary vascularity are normal. No adenopathy. Patient is status post aortic valve replacement. No adenopathy. No bone lesions. No pneumothorax. IMPRESSION: Status post aortic valve replacement. No edema or consolidation. Heart size normal. Electronically Signed   By: Bretta Bang III M.D.   On: 07/05/2018 18:21   Ct Head Wo Contrast  Result Date: 07/05/2018 CLINICAL DATA:  57 y/o  F; fall today.  Headache and dizziness. EXAM: CT HEAD WITHOUT CONTRAST TECHNIQUE: Contiguous axial images were obtained from the base of the skull through the vertex without intravenous contrast. COMPARISON:  04/30/2016 CT head FINDINGS: Brain: There is a region of hypoattenuation with loss of gray-white differentiation and mild local mass effect of the right  occipital lobe which is new from the prior CT of head and compatible with subacute infarction. There multiple small cortical infarctions within the bilateral frontal lobes, parietal lobes, except a lobes as well as a large chronic infarction in the right posterior MCA distribution. There are very small chronic infarctions in bilateral cerebellar hemispheres. There are advanced chronic microvascular ischemic changes of white matter and volume loss of the brain. No hemorrhage, extra-axial collection, hydrocephalus, or herniation. Vascular: Calcific atherosclerosis of the carotid siphons and vertebral arteries. No hyperdense vessel identified. Skull: Normal. Negative for fracture or focal lesion. Sinuses/Orbits: Partial left mastoid opacification. Normal aeration of the mastoid air cells with a small right maxillary sinus fluid level. Orbits are unremarkable. Other: None. IMPRESSION: 1. New subacute infarction of the right occipital lobe with mild edema and mass effect. 2. No acute hemorrhage or calvarial fracture. 3. Numerous chronic infarcts throughout the supratentorial and infratentorial brain including a large chronic right posterior MCA infarct. 4. Partial left mastoid opacification. Small right maxillary fluid level may represent acute sinusitis. Electronically Signed   By: Mitzi Hansen M.D.   On: 07/05/2018 18:39    Procedures Procedures (including critical care time)  Medications Ordered in ED Medications  sodium chloride 0.9 % bolus 1,000 mL (1,000 mLs Intravenous New Bag/Given 07/05/18 2211)  cefTRIAXone (ROCEPHIN) 1 g in sodium chloride 0.9 % 100 mL IVPB (has no administration in time range)  pravastatin (PRAVACHOL) tablet 40 mg (has no administration in time range)  escitalopram (LEXAPRO) tablet 20 mg (has no administration in time range)  traZODone (DESYREL) tablet 50 mg (has no administration in time range)  calcitonin (salmon) (MIACALCIN/FORTICAL) nasal spray 1 spray (  has no  administration in time range)  docusate sodium (COLACE) capsule 100 mg (has no administration in time range)  pantoprazole (PROTONIX) EC tablet 40 mg (has no administration in time range)  darifenacin (ENABLEX) 24 hr tablet 7.5 mg (has no administration in time range)  clopidogrel (PLAVIX) tablet 75 mg (has no administration in time range)  Melatonin TABS 6 mg (has no administration in time range)  gabapentin (NEURONTIN) capsule 400 mg (has no administration in time range)  benzonatate (TESSALON) capsule 100 mg (has no administration in time range)   stroke: mapping our early stages of recovery book (has no administration in time range)  0.9 %  sodium chloride infusion (has no administration in time range)  acetaminophen (TYLENOL) tablet 650 mg (has no administration in time range)    Or  acetaminophen (TYLENOL) solution 650 mg (has no administration in time range)    Or  acetaminophen (TYLENOL) suppository 650 mg (has no administration in time range)  senna-docusate (Senokot-S) tablet 1 tablet (has no administration in time range)  insulin detemir (LEVEMIR) injection 7-17 Units (has no administration in time range)  insulin aspart (novoLOG) injection 0-9 Units (has no administration in time range)  insulin aspart (novoLOG) injection 0-5 Units (has no administration in time range)  insulin aspart (novoLOG) injection 6 Units (has no administration in time range)  ondansetron (ZOFRAN) injection 4 mg (4 mg Intravenous Given 07/05/18 2212)  morphine 4 MG/ML injection 4 mg (4 mg Intravenous Given 07/05/18 2212)     Initial Impression / Assessment and Plan / ED Course  I have reviewed the triage vital signs and the nursing notes.  Pertinent labs & imaging results that were available during my care of the patient were reviewed by me and considered in my medical decision making (see chart for details).  Clinical Course as of Jul 05 2226  Sat Jul 05, 2018  2122 Consult to neurology, Dr. Laurence Slate  will see patient here in department.   [BM]  2149 Consult called to hospitalist, Dr. Antionette Char who agrees to admit patient.   [BM]    Clinical Course User Index [BM] Bill Salinas, PA-C   Patient presenting after mechanical fall from nursing home.  Patient on Coumadin states that she hit the back of her head prompting CT head without contrast.  Patient denying neck/back or any and all other pain.  CT shows1. New subacute infarction of the right occipital lobe with mild edema and mass effect. 2. No acute hemorrhage or calvarial fracture. 3. Numerous chronic infarcts throughout the supratentorial and infratentorial brain including a large chronic right posterior MCA infarct. 4. Partial left mastoid opacification. Small right maxillary fluid level may represent acute sinusitis.  Patient with no new neuro deficits on exam, patient states that her left sided weakness and anisocoria are due to her previous strokes resulting in her nursing home status. Patient endorsing mild occipital headache upon arrival, she is alert and oriented x4. Patient without nausea or vomiting, visual changes or new neuro deficits on exam today.  PT/INR of 24.3.  CBC shows white count of 10.7 and hemoglobin of 11.9.  BMP shows elevated glucose of 326.  Urinalysis shows possible UTI, patient denying dysuria, urinalysis sent for culture.   Additionally patient with fever of 100.4 upon arrival, second temperature shortly after shows temperature of 99.6.  Patient was not tachycardic until most recent temperature.   Neurology Dr. Laurence Slate consulted who will see patient in department and advised admission for further observation.  Dr. Laurence Slate does not suggest changes to patient's current Coumadin therapy at this time.  Admission consult called to Dr. Antionette Char who has agreed to admit the patient to his service.  At time of this consult the patient was noted to be tachycardic as well, IV fluids and antibiotics and lactic acid and blood  cultures ordered.  Patient informed of care plan, she is agreeable to admission.  No acute distress at this time.  She states that she is feeling well, denying any and all pain at this time.   Final Clinical Impressions(s) / ED Diagnoses   Final diagnoses:  Ischemic stroke [facility])    ED Discharge Orders    None       Elizabeth Palau 07/06/18 0026    Bethann Berkshire, MD 07/08/18 1157

## 2018-07-06 DIAGNOSIS — I639 Cerebral infarction, unspecified: Secondary | ICD-10-CM

## 2018-07-06 LAB — GLUCOSE, CAPILLARY
GLUCOSE-CAPILLARY: 351 mg/dL — AB (ref 70–99)
Glucose-Capillary: 255 mg/dL — ABNORMAL HIGH (ref 70–99)
Glucose-Capillary: 212 mg/dL — ABNORMAL HIGH (ref 70–99)
Glucose-Capillary: 139 mg/dL — ABNORMAL HIGH (ref 70–99)
Glucose-Capillary: 360 mg/dL — ABNORMAL HIGH (ref 70–99)

## 2018-07-06 LAB — LIPID PANEL
CHOL/HDL RATIO: 2.4 ratio
Cholesterol: 131 mg/dL (ref 0–200)
HDL: 55 mg/dL (ref >40)
LDL CALC: 58 mg/dL (ref 0–99)
Triglycerides: 92 mg/dL (ref <150)
VLDL: 18 mg/dL (ref 0–40)

## 2018-07-06 LAB — PROTIME-INR
INR: 2.26
Prothrombin Time: 24.7 seconds — ABNORMAL HIGH (ref 11.4–15.2)

## 2018-07-06 LAB — HEMOGLOBIN A1C
Hgb A1c MFr Bld: 8.5 % — ABNORMAL HIGH (ref 4.8–5.6)
MEAN PLASMA GLUCOSE: 197.25 mg/dL

## 2018-07-06 LAB — HIV ANTIBODY (ROUTINE TESTING W REFLEX): HIV Screen 4th Generation wRfx: NONREACTIVE

## 2018-07-06 LAB — LACTIC ACID, PLASMA: Lactic Acid, Venous: 2.8 mmol/L (ref 0.5–1.9)

## 2018-07-06 MED ORDER — MORPHINE SULFATE (PF) 4 MG/ML IV SOLN
3.0000 mg | INTRAVENOUS | Status: AC | PRN
  Administered 2018-07-06 – 2018-07-07 (×2): 3 mg via INTRAVENOUS
  Filled 2018-07-06 (×2): qty 1

## 2018-07-06 MED ORDER — WARFARIN SODIUM 7.5 MG PO TABS
7.5000 mg | ORAL_TABLET | Freq: Once | ORAL | Status: AC
  Administered 2018-07-06: 7.5 mg via ORAL
  Filled 2018-07-06: qty 1

## 2018-07-06 MED ORDER — INSULIN DETEMIR 100 UNIT/ML ~~LOC~~ SOLN
17.0000 [IU] | Freq: Every day | SUBCUTANEOUS | Status: DC
  Administered 2018-07-06 – 2018-07-07 (×2): 17 [IU] via SUBCUTANEOUS
  Filled 2018-07-06 (×3): qty 0.17

## 2018-07-06 MED ORDER — KETOROLAC TROMETHAMINE 30 MG/ML IJ SOLN
30.0000 mg | Freq: Four times a day (QID) | INTRAMUSCULAR | Status: DC | PRN
  Administered 2018-07-06: 30 mg via INTRAVENOUS
  Filled 2018-07-06: qty 1

## 2018-07-06 MED ORDER — HYDROCODONE-ACETAMINOPHEN 5-325 MG PO TABS: 1.0000 | ORAL_TABLET | ORAL | Status: DC | PRN

## 2018-07-06 MED ORDER — INSULIN DETEMIR 100 UNIT/ML ~~LOC~~ SOLN
7.0000 [IU] | Freq: Every day | SUBCUTANEOUS | Status: DC
  Administered 2018-07-06 (×2): 7 [IU] via SUBCUTANEOUS
  Filled 2018-07-06 (×3): qty 0.07

## 2018-07-06 MED ORDER — KETOROLAC TROMETHAMINE 30 MG/ML IJ SOLN
30.0000 mg | Freq: Once | INTRAMUSCULAR | Status: DC
  Filled 2018-07-06: qty 1

## 2018-07-06 MED ORDER — HYDROCODONE-ACETAMINOPHEN 5-325 MG PO TABS
1.0000 | ORAL_TABLET | ORAL | Status: DC | PRN
  Administered 2018-07-06 – 2018-07-09 (×10): 1 via ORAL
  Filled 2018-07-06 (×10): qty 1

## 2018-07-06 NOTE — Evaluation (Signed)
Physical Therapy Evaluation Patient Details Name: Madison [name] MRN: [id number] DOB: Mar 03, 1961 Today's Date: 07/06/2018   History of Present Illness  57 y.o. female with medical history significant for aortic valve replacement on Coumadin, depression, hypertension, insulin-dependent diabetes mellitus, and multiple strokes with residual deficits, admitted from her SNF after suffering a ground-level mechanical fall 2 days PTA during which she hit the back of her head on the floor.  Patient reports that she was on the toilet at her nursing facility, got up to return to her bed, tripped over an assistive device, and fell to the ground, hitting the back of her head on the floor.  MRI large rt PCA CVA with remote bil MCA & PCA infarcts and advanced atrophy from small vessel disease.     Clinical Impression  Pt admitted with above diagnosis. Pt currently with functional limitations due to the deficits listed below (see PT Problem List). Patient lethargic (likely due to pain meds) and reporting severe headache "almost the worst headache I've ever had" with RN informed. Limited activity completed due to these factors. Pt will benefit from skilled PT to increase their independence and safety with mobility to allow discharge to the venue listed below.       Follow Up Recommendations SNF    Equipment Recommendations  None recommended by PT    Recommendations for Other Services       Precautions / Restrictions Precautions Precautions: Fall;Other (comment)(reported blindness) Restrictions Weight Bearing Restrictions: No      Mobility  Bed Mobility Overal bed mobility: Needs Assistance Bed Mobility: Rolling;Sidelying to Sit;Sit to Supine Rolling: Min assist Sidelying to sit: Min assist;HOB elevated(HOB 20)   Sit to supine: Min assist   General bed mobility comments: exit to her rt with use of rail; mod assist to scoot to get feet on floor once upright  Transfers Overall transfer level:  Needs assistance Equipment used: 1 person hand held assist Transfers: Sit to/from Stand Sit to Stand: Min assist         General transfer comment: minguard for safety; no buckling of knees  Ambulation/Gait Ambulation/Gait assistance: Min assist Gait Distance (Feet): 1 Feet Assistive device: 1 person hand held assist Gait Pattern/deviations: Step-to pattern     General Gait Details: sidestepping to [facility]; pt with severe headache and rather drowsy; chose to return pt to supine and not get OOB  Stairs            Wheelchair Mobility    Modified Rankin (Stroke Patients Only) Modified Rankin (Stroke Patients Only) Pre-Morbid Rankin Score: Moderately severe disability Modified Rankin: Moderately severe disability     Balance Overall balance assessment: Mild deficits observed, not formally tested                                           Pertinent Vitals/Pain Pain Assessment: Faces Faces Pain Scale: Hurts whole lot Pain Location: headache Pain Descriptors / Indicators: Headache Pain Intervention(s): Limited activity within patient's tolerance;Monitored during session;Repositioned;Patient requesting pain meds-RN notified    Home Living Family/patient expects to be discharged to:: Skilled nursing facility                 Additional Comments: [facility] reports in skilled rehab section (?) and states been there 2 years    Prior Function Level of Independence: Needs assistance   Gait / Transfers Assistance Needed: uses RW and  is modified independent  ADL's / Homemaking Assistance Needed: assist for ADLs        Hand Dominance   Dominant Hand: Right    Extremity/Trunk Assessment   Upper Extremity Assessment Upper Extremity Assessment: Defer to OT evaluation    Lower Extremity Assessment Lower Extremity Assessment: Generalized weakness;LLE deficits/detail LLE Deficits / Details: incr extensor tone; able to fully flex hip and knee in  rt sidelying; ankle DF to neutral only       Communication      Cognition Arousal/Alertness: Lethargic   Overall Cognitive Status: No family/caregiver present to determine baseline cognitive functioning(oriented to self, location, reason for admission)                                        General Comments General comments (skin integrity, edema, etc.): Pt followed all commands    Exercises     Assessment/Plan    PT Assessment Patient needs continued PT services  PT Problem List Decreased strength;Decreased activity tolerance;Decreased balance;Decreased mobility;Decreased cognition;Decreased knowledge of use of DME;Decreased knowledge of precautions;Impaired tone;Obesity       PT Treatment Interventions DME instruction;Gait training;Functional mobility training;Therapeutic activities;Therapeutic exercise;Balance training;Neuromuscular re-education;Cognitive remediation;Patient/family education    PT Goals (Current goals can be found in the Care Plan section)  Acute Rehab PT Goals Patient Stated Goal: be able to see PT Goal Formulation: With patient Time For Goal Achievement: 07/20/18 Potential to Achieve Goals: Good    Frequency Min 3X/week   Barriers to discharge        Co-evaluation               AM-PAC PT "6 Clicks" Daily Activity  Outcome Measure Difficulty turning over in bed (including adjusting bedclothes, sheets and blankets)?: A Little Difficulty moving from lying on back to sitting on the side of the bed? : A Lot Difficulty sitting down on and standing up from a chair with arms (e.g., wheelchair, bedside commode, etc,.)?: A Little Help needed moving to and from a bed to chair (including a wheelchair)?: A Lot Help needed walking in hospital room?: A Lot Help needed climbing 3-5 steps with a railing? : Total 6 Click Score: 13    End of Session   Activity Tolerance: No increased pain(but headache severe on arrival) Patient left: in  bed;with call bell/phone within reach;with bed alarm set(asked unit secretary to order soft touch call bell) Nurse Communication: Mobility status;Patient requests pain meds;Other (comment)(will request soft touch call light) PT Visit Diagnosis: Unsteadiness on feet (R26.81);History of falling (Z91.81);Muscle weakness (generalized) (M62.81);Other abnormalities of gait and mobility (R26.89)    Time: 4917-9150 PT Time Calculation (min) (ACUTE ONLY): 26 min   Charges:   PT Evaluation $PT Eval Moderate Complexity: 1 Mod            [employer], PT 07/06/2018, 11:33 AM

## 2018-07-06 NOTE — Progress Notes (Addendum)
ANTICOAGULATION CONSULT NOTE - Follow Up Consult  Pharmacy Consult for Warfarin Indication: Hx CVA and mechanical AVR  Allergies  Allergen Reactions  . Tape Other (See Comments)    Can burn skin if left on too long. Reaction:Burning  Can burn skin if left on too long.  . Celebrex [Celecoxib] Rash  . Detrol [Tolterodine] Hives    Patient Measurements: Height: 5\' 3"  (160 cm) Weight: 174 lb 13.2 oz (79.3 kg) IBW/kg (Calculated) : 52.4  Vital Signs: Temp: 98.6 F (37 C) (08/18 0351) Temp Source: Oral (08/18 0351) BP: 113/49 (08/18 0351) Pulse Rate: 82 (08/18 0351)  Labs: Recent Labs    07/05/18 1911 07/06/18 0329  HGB 11.9*  --   HCT 37.5  --   PLT 228  --   LABPROT 24.3* 24.7*  INR 2.20 2.26  CREATININE 1.00  --     Estimated Creatinine Clearance: 61.9 mL/min (by C-G formula based on SCr of 1 mg/dL).   Assessment: 57 y/o F on warfarin PTA for hx CVA and mechanical AVR here from a nursing facility s/p mechanical fall. CT with no bleeding but subacute infarct noted. Hgb 11.9. INR is just below goal at 2.26 (goal 2.5-3.5).  Home regimen: 5mg  most days and ?2.5mg  on Tues/Thurs. No missed doses reported.   Goal of Therapy:  INR 2.5 - 3.5 Monitor platelets by anticoagulation protocol: Yes   Plan:  Give Warfarin 7.5mg  po x1 again tonight Daily PT/INR Monitor for bleeding with subacute infarct noted.  Could consider Heparin bridge -- with high caution in setting of subacute bleed  58, PharmD, BCPS, BCCCP Clinical Pharmacist Clinical phone 07/06/2018 until 3:30PM - [phone] After hours, please call #[phone] 07/06/2018,8:38 AM

## 2018-07-06 NOTE — Progress Notes (Signed)
Pt lactic acid now 2.8, on call provider paged to notify at this time.

## 2018-07-06 NOTE — Progress Notes (Signed)
PROGRESS NOTE    Madison [name]  IFO:[id number] DOB: 1961/11/18 DOA: 07/05/2018 PCP: Renford Dills, MD    Brief Narrative:  57 y.o. female with medical history significant for aortic valve replacement on Coumadin, depression, hypertension, insulin-dependent diabetes mellitus, and multiple strokes with residual deficits, now presenting from her SNF after suffering a ground-level mechanical fall, hitting the back of her head on the floor.  Patient reports that she was on the toilet at her nursing facility, got up to return to her bed, tripped over an assistive device, and fell to the ground, hitting the back of her head on the floor.  There is no loss of consciousness.  She reports having a headache for the past 2 days, not significantly changed since the fall.  She also reports blurred vision for the past 2 days, again no different after the fall.  She has not appreciated any fever and denies any neck pain or stiffness, denies rhinorrhea or sinus congestion, and denies cough, shortness of breath, chest pain, or palpitations.  ED Course: Upon arrival to the ED, patient is found to be febrile to 38 C, saturating adequately on room air, slightly tachycardic, and with stable blood pressure.  Chest x-ray is negative for edema or consolidation.  Noncontrast head CT is concerning for new subacute infarct of the right occipital lobe with mild edema and mass-effect, but no acute hemorrhage or fracture.  Chemistry panel is notable for sodium of 131 and glucose 326.  CBC features a leukocytosis to 10,700.  Urinalysis is suggestive of infection and sample was sent for culture.  INR is 2.2.  Patient was given morphine and a liter of normal saline in the ED.  Neurology was consulted and recommended a medical admission for further evaluation and management.  Assessment & Plan:   Principal Problem:   Ischemic stroke (HCC) Active Problems:   CAD (coronary artery disease)   Depression   Essential hypertension  S/P AVR (aortic valve replacement)   Sepsis secondary to UTI (HCC)   Insulin dependent diabetes mellitus (HCC)   Septic arthritis of knee, left (HCC)  1. Ischemic CVA  - Presents from SNF after a fall, hitting the back of her head on floor, no LOC, and preceded by 2 days of headache and blurred vision   - Head CT concerning for new subacute infarct involving right occipital lobe with mild edema and mass-effect, no hemorrhage or fracture  - Neurology was consulted  - MRI obtained and reviewed. Findings worrisome for large R PCA territory infarct - Per Neurology, no need for repeat dopplers or MRA head. Neurology recommends repeat 2d echo to r/o intracardiac thrombus. Ordered and pending -Pt seen by PT with recommendation for SNF, SW consulted  2. Sepsis secondary to UTI  - Presents after a fall, reports headache, blurred vision, and malaise  - Found to have fever, tachycardia, and leukocytosis  - UA suggestive of infection and sample was sent for cultures; CXR without focal infiltrate, no URI symptoms, abd exam is benign, no meningismus  - Patient currently on empiric rocephin. Follow up on cultures  3. CAD - denies chest pains - Continue statin and Plavix per Neurology recs - BP meds held to allow for permissive HTN  4. Aortic valve replacement  - No hemorrhage on CT  - Continue warfarin per pharmacy dosing - no evidence of acute blood loss  5. Insulin-dependent DM  - A1c was 9.8% last year  - Managed at SNF with Levemir 25 units qAM  and 10 units qPM, as well as Novolog 12 units TID  -Continue Levemir and Novolog as tolerated -Will continue on SSI coverage, glucose trends reviewed. Improved with insulin  6. Hypertension  - BP remains at goal -not on bp meds at present  7. Depression  - Continue Lexapro and trazodone as tolerated  8. Chronic anticoagulation -coumadin per above  9. Headache -Little relief with tylenol -Discussed with pharmacy. Will give trial of one  time toradol  DVT prophylaxis: Coumadin per pharmacy dosing Code Status: Full Family Communication: Pt in room, family not at bedside Disposition Plan: SNF, timing uncertain  Consultants:   Neurology  Procedures:     Antimicrobials: Anti-infectives (From admission, onward)   Start     Dose/Rate Route Frequency Ordered Stop   07/05/18 2200  cefTRIAXone (ROCEPHIN) 1 g in sodium chloride 0.9 % 100 mL IVPB     1 g 200 mL/hr over 30 Minutes Intravenous Every 24 hours 07/05/18 2148         Subjective: Complaining of headache this AM. No photophobia or phonophobia  Objective: Vitals:   07/06/18 0351 07/06/18 0851 07/06/18 1204 07/06/18 1649  BP: (!) 113/49 94/69 (!) 111/50 122/60  Pulse: 82 88 81 77  Resp:  17 18 18   Temp: 98.6 F (37 C) 98.9 F (37.2 C) 99.7 F (37.6 C) 98.3 F (36.8 C)  TempSrc: Oral Oral Oral Oral  SpO2: 98% 96% 92% 90%  Weight:      Height:        Intake/Output Summary (Last 24 hours) at 07/06/2018 1711 Last data filed at 07/06/2018 0555 Gross per 24 hour  Intake 388.91 ml  Output -  Net 388.91 ml   Filed Weights   07/06/18 0055  Weight: 79.3 kg    Examination:  General exam: Appears calm and comfortable  Respiratory system: Clear to auscultation. Respiratory effort normal. Cardiovascular system: S1 & S2 heard, RRR Gastrointestinal system: Abdomen is nondistended, soft and nontender. No organomegaly or masses felt. Normal bowel sounds heard. Central nervous system: Alert and oriented. No seizures or tremors. Extremities: Symmetric 5 x 5 power. Skin: No rashes, lesions Psychiatry: Judgement and insight appear normal. Mood & affect appropriate.   Data Reviewed: I have personally reviewed following labs and imaging studies  CBC: Recent Labs  Lab 07/05/18 1911  WBC 10.7*  NEUTROABS 8.3*  HGB 11.9*  HCT 37.5  MCV 91.9  PLT 228   Basic Metabolic Panel: Recent Labs  Lab 07/05/18 1911  NA 131*  K 4.5  CL 95*  CO2 24  GLUCOSE  326*  BUN 11  CREATININE 1.00  CALCIUM 9.0   GFR: Estimated Creatinine Clearance: 61.9 mL/min (by C-G formula based on SCr of 1 mg/dL). Liver Function Tests: No results for input(s): AST, ALT, ALKPHOS, BILITOT, PROT, ALBUMIN in the last 168 hours. No results for input(s): LIPASE, AMYLASE in the last 168 hours. No results for input(s): AMMONIA in the last 168 hours. Coagulation Profile: Recent Labs  Lab 07/05/18 1911 07/06/18 0329  INR 2.20 2.26   Cardiac Enzymes: No results for input(s): CKTOTAL, CKMB, CKMBINDEX, TROPONINI in the last 168 hours. BNP (last 3 results) No results for input(s): PROBNP in the last 8760 hours. HbA1C: Recent Labs    07/06/18 0329  HGBA1C 8.5*   CBG: Recent Labs  Lab 07/05/18 2250 07/06/18 0850 07/06/18 1130 07/06/18 1648  GLUCAP 380* 255* 212* 139*   Lipid Profile: Recent Labs    07/06/18 0329  CHOL 131  HDL 55  LDLCALC 58  TRIG 92  CHOLHDL 2.4   Thyroid Function Tests: No results for input(s): TSH, T4TOTAL, FREET4, T3FREE, THYROIDAB in the last 72 hours. Anemia Panel: No results for input(s): VITAMINB12, FOLATE, FERRITIN, TIBC, IRON, RETICCTPCT in the last 72 hours. Sepsis Labs: Recent Labs  Lab 07/05/18 2205 07/06/18 0128  LATICACIDVEN 1.3 2.8*    Recent Results (from the past 240 hour(s))  Urine culture     Status: None (Preliminary result)   Collection Time: 07/05/18  7:20 PM  Result Value Ref Range Status   Specimen Description URINE, RANDOM  Final   Special Requests NONE  Final   Culture   Final    CULTURE REINCUBATED FOR BETTER GROWTH Performed at [facility], [address], [place], [place] [address]    Report Status PENDING  Incomplete     Radiology Studies: Dg Chest 2 View  Result Date: 07/05/2018 CLINICAL DATA:  Pain following fall EXAM: CHEST - 2 VIEW COMPARISON:  November 29, 2016 FINDINGS: There is no edema or consolidation. Heart size and pulmonary vascularity are normal. No adenopathy. Patient  is status post aortic valve replacement. No adenopathy. No bone lesions. No pneumothorax. IMPRESSION: Status post aortic valve replacement. No edema or consolidation. Heart size normal. Electronically Signed   By: Bretta Bang III M.D.   On: 07/05/2018 18:21   Ct Head Wo Contrast  Result Date: 07/05/2018 CLINICAL DATA:  57 y/o  F; fall today.  Headache and dizziness. EXAM: CT HEAD WITHOUT CONTRAST TECHNIQUE: Contiguous axial images were obtained from the base of the skull through the vertex without intravenous contrast. COMPARISON:  04/30/2016 CT head FINDINGS: Brain: There is a region of hypoattenuation with loss of gray-white differentiation and mild local mass effect of the right occipital lobe which is new from the prior CT of head and compatible with subacute infarction. There multiple small cortical infarctions within the bilateral frontal lobes, parietal lobes, except a lobes as well as a large chronic infarction in the right posterior MCA distribution. There are very small chronic infarctions in bilateral cerebellar hemispheres. There are advanced chronic microvascular ischemic changes of white matter and volume loss of the brain. No hemorrhage, extra-axial collection, hydrocephalus, or herniation. Vascular: Calcific atherosclerosis of the carotid siphons and vertebral arteries. No hyperdense vessel identified. Skull: Normal. Negative for fracture or focal lesion. Sinuses/Orbits: Partial left mastoid opacification. Normal aeration of the mastoid air cells with a small right maxillary sinus fluid level. Orbits are unremarkable. Other: None. IMPRESSION: 1. New subacute infarction of the right occipital lobe with mild edema and mass effect. 2. No acute hemorrhage or calvarial fracture. 3. Numerous chronic infarcts throughout the supratentorial and infratentorial brain including a large chronic right posterior MCA infarct. 4. Partial left mastoid opacification. Small right maxillary fluid level may  represent acute sinusitis. Electronically Signed   By: Mitzi Hansen M.D.   On: 07/05/2018 18:39   Mr Brain Wo Contrast  Result Date: 07/06/2018 CLINICAL DATA:  Initial evaluation for acute stroke. EXAM: MRI HEAD WITHOUT CONTRAST TECHNIQUE: Multiplanar, multiecho pulse sequences of the brain and surrounding structures were obtained without intravenous contrast. COMPARISON:  Prior CT from 07/05/2018. FINDINGS: Brain: Diffuse prominence of the CSF containing spaces compatible with generalized age-related cerebral atrophy. Patchy and confluent T2/FLAIR hyperintensity within the periventricular and deep white matter both cerebral hemispheres consistent with chronic small vessel ischemic change, advanced in nature. Extensive encephalomalacia throughout the right cerebral hemisphere consistent with large remote right MCA territory  infarct. Additional scattered multifocal left MCA territory infarcts involving the left frontal and parietal lobes. Remote left PCA territory infarct involving the left occipital lobe. Scatter remote bilateral cerebellar infarcts. Small remote lacunar infarcts present within the bilateral thalami. Large confluent area of restricted diffusion involving the parasagittal right parieto-occipital region consistent with acute to early subacute right PCA territory infarct (series 5, image 69). Patchy involvement of the right thalamus. Associated petechial hemorrhage without frank hemorrhagic transformation (series 12, image 23). Associated localized gyral swelling and edema without significant regional mass effect. No other evidence for acute or subacute infarct. No mass lesion, midline shift or mass effect. Mild ex vacuo dilatation of the left lateral ventricle related to chronic left PCA territory infarct without hydrocephalus. No extra-axial fluid collection. Pituitary gland normal. Vascular: Abnormal flow void within the right ICA to the level of the terminus, likely occluded. Major  intracranial vascular flow voids otherwise maintained. Skull and upper cervical spine: Craniocervical junction normal. Upper cervical spine within normal limits. Bone marrow signal intensity normal. No scalp soft tissue abnormality. Sinuses/Orbits: Globes and orbital soft tissues within normal limits. Mild mucosal thickening with small air-fluid level within the right maxillary sinus, suggesting possible acute sinusitis. Left mastoid effusion noted. Trace opacity right mastoid air cells. Other: None. IMPRESSION: 1. Large acute to early subacute right PCA territory infarct. Associated petechial hemorrhage without frank hemorrhagic transformation. No significant regional mass effect. 2. Extensive chronic ischemic changes with multiple remote bilateral MCA and PCA territory infarcts, with additional remote bilateral cerebellar infarcts. 3. Abnormal flow void within the right ICA to the terminus, likely occluded. 4. Advanced atrophy with chronic small vessel ischemic disease. Electronically Signed   By: Rise Mu M.D.   On: 07/06/2018 00:48    Scheduled Meds: . calcitonin (salmon)  1 spray Alternating Nares Daily  . clopidogrel  75 mg Oral Daily  . darifenacin  7.5 mg Oral Daily  . docusate sodium  100 mg Oral BID  . escitalopram  20 mg Oral Daily  . gabapentin  400 mg Oral TID  . insulin aspart  0-5 Units Subcutaneous QHS  . insulin aspart  0-9 Units Subcutaneous TID WC  . insulin aspart  6 Units Subcutaneous TID WC  . insulin detemir  17 Units Subcutaneous Daily   And  . insulin detemir  7 Units Subcutaneous QHS  . ketorolac  30 mg Intravenous Once  . Melatonin  6 mg Oral QHS  . pantoprazole  40 mg Oral Q0600  . pravastatin  40 mg Oral q1800  . traZODone  50 mg Oral QHS  . Warfarin - Pharmacist Dosing Inpatient   Does not apply q1800   Continuous Infusions: . cefTRIAXone (ROCEPHIN)  IV 1 g (07/05/18 2255)     LOS: 1 day   Rickey Barbara, MD [facility] Pager  [phone]  If 7PM-7AM, please contact night-coverage [email or web address] Password [email or web address] 07/06/2018, 5:11 PM

## 2018-07-06 NOTE — Consult Note (Signed)
Requesting Physician: Harlene Salts [facility], Dr Antionette Char    Chief Complaint:  Fall, headache  History obtained from: Patient and Chart    HPI:                                                                                                                                       Madison [name] is an 57 y.o. female's medical history of aortic valve replacement on Coumadin, diabetes mellitus, hypertension, multiple CVAs including bilateral hemispheres presents to the emergency department after suffering a mechanical fall. Head CT was performed which showed no evidence of subdural hemorrhage, however showed a subacute right PCA infarct.  Patient states that she has been having blurry vision in both eyes since the last 2 days has had significant trouble seeing. Her INR is 2.2 today  ( goal 2.5- 3.5), however when lower when checked on 7/18/ 2019 - subtherapeutic at 0.99.  Unclear if Coumadin was held at the time for recannulation of proximal tibial arteries.  Date last known well: 2 days ago tPA Given: No outside window on on warfarin Baseline MRS 4     Past Medical History:  Diagnosis Date  . Allergy   . Anxiety   . Aortic stenosis   . CAD (coronary artery disease)   . CHF (congestive heart failure) (HCC)   . Depression   . Diabetes (HCC) dx'd 1982   "went straight to insulin"  . Dyslipidemia   . GERD (gastroesophageal reflux disease)   . Heart murmur   . HTN (hypertension)   . Hypercholesteremia   . Myocardial infarction (HCC) 12/2014  . Obesity   . Pneumonia 11/2014; 12/2014  . Rheumatoid arthritis(714.0)    "hands" (07/01/2015)  . Stroke syndrome 1995; 2001   "when my son was born; problems w/speech and L hand since then" (01/19/2015)  . Thrombophlebitis     Past Surgical History:  Procedure Laterality Date  . AORTIC VALVE REPLACEMENT (AVR)/CORONARY ARTERY BYPASS GRAFTING (CABG)  "2014"   [name] 03/08/2014  . CARDIAC CATHETERIZATION  12/08/2014  . CATARACT EXTRACTION Left   . CESAREAN  SECTION  1995  . CORONARY ANGIOPLASTY WITH STENT PLACEMENT  12/09/2014  . CORONARY ARTERY BYPASS GRAFT    . FOOT FRACTURE SURGERY    . FRACTURE SURGERY    . INTRAMEDULLARY (IM) NAIL INTERTROCHANTERIC Left 11/30/2016   Procedure: INTRAMEDULLARY (IM) NAIL LEFT HIP;  Surgeon: Yolonda Kida, MD;  Location: [facility] OR;  Service: Orthopedics;  Laterality: Left;  . KNEE ARTHROSCOPY Right   . LEFT HEART CATHETERIZATION WITH CORONARY ANGIOGRAM N/A 12/08/2014   Procedure: LEFT HEART CATHETERIZATION WITH CORONARY ANGIOGRAM;  Surgeon: Iran Ouch, MD;  Location: [facility] CATH LAB;  Service: Cardiovascular;  Laterality: N/A;  . LOWER EXTREMITY ANGIOGRAPHY N/A 05/08/2018   Procedure: LOWER EXTREMITY ANGIOGRAPHY;  Surgeon: Fransisco Hertz, MD;  Location: [facility] INVASIVE CV LAB;  Service: Cardiovascular;  Laterality: N/A;  .  LOWER EXTREMITY ANGIOGRAPHY N/A 06/05/2018   Procedure: LOWER EXTREMITY ANGIOGRAPHY - Right Leg;  Surgeon: Fransisco Hertz, MD;  Location: [facility] INVASIVE CV LAB;  Service: Cardiovascular;  Laterality: N/A;  . NEUROPLASTY / TRANSPOSITION MEDIAN NERVE AT CARPAL TUNNEL    . ORIF ANKLE FRACTURE Left 07/04/2015   Procedure: OPEN REDUCTION INTERNAL FIXATION (ORIF)  TRIMAL ANKLE FRACTURE;  Surgeon: [name], MD;  Location: [facility] OR;  Service: Orthopedics;  Laterality: Left;  . PERCUTANEOUS CORONARY STENT INTERVENTION (PCI-S) N/A 12/09/2014   Procedure: PERCUTANEOUS CORONARY STENT INTERVENTION (PCI-S);  Surgeon: [name], MD;  Location: [facility] CATH LAB;  Service: Cardiovascular;  Laterality: N/A;  . PERIPHERAL VASCULAR BALLOON ANGIOPLASTY Left 05/08/2018   Procedure: PERIPHERAL VASCULAR BALLOON ANGIOPLASTY;  Surgeon: Fransisco Hertz, MD;  Location: [facility] INVASIVE CV LAB;  Service: Cardiovascular;  Laterality: Left;  Left anterior tibial, and tibioperoneal trunk  . PERIPHERAL VASCULAR INTERVENTION Left 05/08/2018   Procedure: PERIPHERAL VASCULAR INTERVENTION;  Surgeon: Fransisco Hertz, MD;  Location: [facility] INVASIVE CV LAB;   Service: Cardiovascular;  Laterality: Left;  Popliteal artery  . PERIPHERAL VASCULAR INTERVENTION Right 06/05/2018   Procedure: PERIPHERAL VASCULAR INTERVENTION;  Surgeon: Fransisco Hertz, MD;  Location: [facility] INVASIVE CV LAB;  Service: Cardiovascular;  Laterality: Right;  . TUBAL LIGATION  1995    Family History  Problem Relation Age of Onset  . Heart disease Mother   . Heart disease Sister   . Stroke Unknown   . Breast cancer Neg Hx    Social History:  reports that she has never smoked. She has never used smokeless tobacco. She reports that she does not drink alcohol or use drugs.  Allergies:  Allergies  Allergen Reactions  . Tape Other (See Comments)    Can burn skin if left on too long. Reaction:Burning  Can burn skin if left on too long.  . Celebrex [Celecoxib] Rash  . Detrol [Tolterodine] Hives    Medications:                                                                                                                        I reviewed home medications   ROS:                                                                                                                                     14 systems reviewed and negative except  above   Examination:                                                                                                      General: Appears well-developed and well-nourished.  Psych: Affect appropriate to situation Eyes: No scleral injection HENT: No OP obstrucion Head: Normocephalic.  Cardiovascular: Normal rate and regular rhythm.  Respiratory: Effort normal and breath sounds normal to anterior ascultation GI: Soft.  No distension. There is no tenderness.  Skin: WDI    Neurological Examination Mental Status: Alert, oriented, thought content appropriate.  Speech fluent without evidence of aphasia.  Dysarthric speech. able to follow 3 step commands without difficulty. Cranial Nerves: II: Visual fields: Very difficult to assess.  After  multiple attempts appears to finger count in the nasal upper quadrant of left eye and inferior temporal quadrant of the right eye. III,IV, VI: ptosis not present, extra-ocular motions intact bilaterally, pupils equal, round, reactive to light and accommodation V,VII: Mild left facial asymmetry  VIII: hearing normal bilaterally IX,X: uvula rises symmetrically XI: bilateral shoulder shrug XII: midline tongue extension Motor: Right : Upper extremity   5/5    Left:     Upper extremity   2+/5  Lower extremity   5/5     Lower extremity   4/5 Sitting in both left upper and lower extremity with contracture of the left arm Sensory: Pinprick and light touch intact throughout, bilaterally Deep Tendon Reflexes: 3+ patellar reflex on R side, unable to obtain reflex on left side. Clonus on left leg Plantars: Right: downgoing   Left: upgoing Cerebellar: normal finger-to-nose normal on right side Gait: unable to assess    Lab Results: Basic Metabolic Panel: Recent Labs  Lab 07/05/18 1911  NA 131*  K 4.5  CL 95*  CO2 24  GLUCOSE 326*  BUN 11  CREATININE 1.00  CALCIUM 9.0    CBC: Recent Labs  Lab 07/05/18 1911  WBC 10.7*  NEUTROABS 8.3*  HGB 11.9*  HCT 37.5  MCV 91.9  PLT 228    Coagulation Studies: Recent Labs    07/05/18 1911  LABPROT 24.3*  INR 2.20    Imaging: Dg Chest 2 View  Result Date: 07/05/2018 CLINICAL DATA:  Pain following fall EXAM: CHEST - 2 VIEW COMPARISON:  November 29, 2016 FINDINGS: There is no edema or consolidation. Heart size and pulmonary vascularity are normal. No adenopathy. Patient is status post aortic valve replacement. No adenopathy. No bone lesions. No pneumothorax. IMPRESSION: Status post aortic valve replacement. No edema or consolidation. Heart size normal. Electronically Signed   By: Bretta Bang III M.D.   On: 07/05/2018 18:21   Ct Head Wo Contrast  Result Date: 07/05/2018 CLINICAL DATA:  57 y/o  F; fall today.  Headache and  dizziness. EXAM: CT HEAD WITHOUT CONTRAST TECHNIQUE: Contiguous axial images were obtained from the base of the skull through the vertex without intravenous contrast. COMPARISON:  04/30/2016 CT head FINDINGS: Brain: There is a region of hypoattenuation with loss of gray-white differentiation and mild local mass effect of the right occipital lobe  which is new from the prior CT of head and compatible with subacute infarction. There multiple small cortical infarctions within the bilateral frontal lobes, parietal lobes, except a lobes as well as a large chronic infarction in the right posterior MCA distribution. There are very small chronic infarctions in bilateral cerebellar hemispheres. There are advanced chronic microvascular ischemic changes of white matter and volume loss of the brain. No hemorrhage, extra-axial collection, hydrocephalus, or herniation. Vascular: Calcific atherosclerosis of the carotid siphons and vertebral arteries. No hyperdense vessel identified. Skull: Normal. Negative for fracture or focal lesion. Sinuses/Orbits: Partial left mastoid opacification. Normal aeration of the mastoid air cells with a small right maxillary sinus fluid level. Orbits are unremarkable. Other: None. IMPRESSION: 1. New subacute infarction of the right occipital lobe with mild edema and mass effect. 2. No acute hemorrhage or calvarial fracture. 3. Numerous chronic infarcts throughout the supratentorial and infratentorial brain including a large chronic right posterior MCA infarct. 4. Partial left mastoid opacification. Small right maxillary fluid level may represent acute sinusitis. Electronically Signed   By: Mitzi Hansen M.D.   On: 07/05/2018 18:39   Mr Brain Wo Contrast  Result Date: 07/06/2018 CLINICAL DATA:  Initial evaluation for acute stroke. EXAM: MRI HEAD WITHOUT CONTRAST TECHNIQUE: Multiplanar, multiecho pulse sequences of the brain and surrounding structures were obtained without intravenous  contrast. COMPARISON:  Prior CT from 07/05/2018. FINDINGS: Brain: Diffuse prominence of the CSF containing spaces compatible with generalized age-related cerebral atrophy. Patchy and confluent T2/FLAIR hyperintensity within the periventricular and deep white matter both cerebral hemispheres consistent with chronic small vessel ischemic change, advanced in nature. Extensive encephalomalacia throughout the right cerebral hemisphere consistent with large remote right MCA territory infarct. Additional scattered multifocal left MCA territory infarcts involving the left frontal and parietal lobes. Remote left PCA territory infarct involving the left occipital lobe. Scatter remote bilateral cerebellar infarcts. Small remote lacunar infarcts present within the bilateral thalami. Large confluent area of restricted diffusion involving the parasagittal right parieto-occipital region consistent with acute to early subacute right PCA territory infarct (series 5, image 69). Patchy involvement of the right thalamus. Associated petechial hemorrhage without frank hemorrhagic transformation (series 12, image 23). Associated localized gyral swelling and edema without significant regional mass effect. No other evidence for acute or subacute infarct. No mass lesion, midline shift or mass effect. Mild ex vacuo dilatation of the left lateral ventricle related to chronic left PCA territory infarct without hydrocephalus. No extra-axial fluid collection. Pituitary gland normal. Vascular: Abnormal flow void within the right ICA to the level of the terminus, likely occluded. Major intracranial vascular flow voids otherwise maintained. Skull and upper cervical spine: Craniocervical junction normal. Upper cervical spine within normal limits. Bone marrow signal intensity normal. No scalp soft tissue abnormality. Sinuses/Orbits: Globes and orbital soft tissues within normal limits. Mild mucosal thickening with small air-fluid level within the  right maxillary sinus, suggesting possible acute sinusitis. Left mastoid effusion noted. Trace opacity right mastoid air cells. Other: None. IMPRESSION: 1. Large acute to early subacute right PCA territory infarct. Associated petechial hemorrhage without frank hemorrhagic transformation. No significant regional mass effect. 2. Extensive chronic ischemic changes with multiple remote bilateral MCA and PCA territory infarcts, with additional remote bilateral cerebellar infarcts. 3. Abnormal flow void within the right ICA to the terminus, likely occluded. 4. Advanced atrophy with chronic small vessel ischemic disease. Electronically Signed   By: Rise Mu M.D.   On: 07/06/2018 00:48     ASSESSMENT AND PLAN  57 y.o. female's  medical history of aortic valve replacement on Coumadin, diabetes mellitus, hypertension, multiple CVAs including bilateral hemispheres presents to the emergency department after suffering a mechanical fall.  Patient has been complaining of blurry vision for the last 2 days and may have resulted in the fall.  CT does show a right PCA infarct which is likely subacute.  Etiology subtherapeutic anticoagulation.  Currently borderline at 2.2 and I would not start her on heparin drip. However, between June 20 and July 18, appears to have been subtherapeutic in the range of 1.  I am not sure if she was advised to discontinue Coumadin as she was undergoing vascular interventions.  Unclear from their note whether she was on Coumadin are not at that time.   Subacute Right PCA infarct - likely due to setting of subtherapeutic anticoagulation on Warfarin  - No need to repeat Dopplers, MRA head - Can repeat Echo to r/o cardiac thrombus - Resume warfarin  - Neurochecks  - PT/OT evaluation      Damiya Sandefur [facility] Pager Number [phone]

## 2018-07-07 ENCOUNTER — Other Ambulatory Visit: Payer: Self-pay

## 2018-07-07 ENCOUNTER — Inpatient Hospital Stay (HOSPITAL_COMMUNITY): Payer: Medicare Other

## 2018-07-07 DIAGNOSIS — I739 Peripheral vascular disease, unspecified: Secondary | ICD-10-CM

## 2018-07-07 DIAGNOSIS — L97909 Non-pressure chronic ulcer of unspecified part of unspecified lower leg with unspecified severity: Principal | ICD-10-CM

## 2018-07-07 DIAGNOSIS — I340 Nonrheumatic mitral (valve) insufficiency: Secondary | ICD-10-CM

## 2018-07-07 DIAGNOSIS — I70299 Other atherosclerosis of native arteries of extremities, unspecified extremity: Secondary | ICD-10-CM

## 2018-07-07 LAB — GLUCOSE, CAPILLARY
GLUCOSE-CAPILLARY: 59 mg/dL — AB (ref 70–99)
GLUCOSE-CAPILLARY: 333 mg/dL — AB (ref 70–99)
Glucose-Capillary: 128 mg/dL — ABNORMAL HIGH (ref 70–99)
Glucose-Capillary: 58 mg/dL — ABNORMAL LOW (ref 70–99)
Glucose-Capillary: 70 mg/dL (ref 70–99)
Glucose-Capillary: 268 mg/dL — ABNORMAL HIGH (ref 70–99)

## 2018-07-07 LAB — BASIC METABOLIC PANEL
ANION GAP: 9 (ref 5–15)
BUN: 12 mg/dL (ref 6–20)
CALCIUM: 9.1 mg/dL (ref 8.9–10.3)
CHLORIDE: 102 mmol/L (ref 98–111)
CO2: 25 mmol/L (ref 22–32)
Creatinine, Ser: 0.99 mg/dL (ref 0.44–1.00)
GFR calc non Af Amer: >60 mL/min (ref >60)
Glucose, Bld: 163 mg/dL — ABNORMAL HIGH (ref 70–99)
POTASSIUM: 4.5 mmol/L (ref 3.5–5.1)
Sodium: 136 mmol/L (ref 135–145)

## 2018-07-07 LAB — ECHOCARDIOGRAM COMPLETE
Height: 63 in
Weight: 2797.2 oz

## 2018-07-07 LAB — PROTIME-INR
INR: 3.35
Prothrombin Time: 33.7 seconds — ABNORMAL HIGH (ref 11.4–15.2)

## 2018-07-07 MED ORDER — INSULIN ASPART 100 UNIT/ML ~~LOC~~ SOLN
10.0000 [IU] | Freq: Three times a day (TID) | SUBCUTANEOUS | Status: DC
  Administered 2018-07-08 – 2018-07-09 (×3): 10 [IU] via SUBCUTANEOUS

## 2018-07-07 MED ORDER — INSULIN DETEMIR 100 UNIT/ML ~~LOC~~ SOLN
20.0000 [IU] | Freq: Every day | SUBCUTANEOUS | Status: DC
  Administered 2018-07-08: 20 [IU] via SUBCUTANEOUS
  Filled 2018-07-07: qty 0.2

## 2018-07-07 MED ORDER — INSULIN DETEMIR 100 UNIT/ML ~~LOC~~ SOLN
10.0000 [IU] | Freq: Every day | SUBCUTANEOUS | Status: DC
  Administered 2018-07-07: 10 [IU] via SUBCUTANEOUS
  Filled 2018-07-07 (×2): qty 0.1

## 2018-07-07 NOTE — Progress Notes (Signed)
  Hypoglycemic Event  CBG: 59   Treatment: 15 GM carbohydrate snack  Symptoms: None  Follow-up CBG: Time:1735 CBG Result:70  Possible Reasons for Event: Unknown  Comments/MD notified:CHIU    Dimple Casey Lauree Chandler

## 2018-07-07 NOTE — Evaluation (Signed)
Speech Language Pathology Evaluation Patient Details Name: AMYE GREGO MRN: [id number] DOB: 1961/04/20 Today's Date: 07/07/2018 Time: 1000-1020 SLP Time Calculation (min) (ACUTE ONLY): 20 min  Problem List:  Patient Active Problem List   Diagnosis Date Noted  . Septic arthritis of knee, left (HCC) 07/05/2018  . Atherosclerosis of artery of extremity with ulceration (HCC) 04/28/2018  . Insulin dependent diabetes mellitus (HCC) 03/07/2017  . Closed displaced intertrochanteric fracture of left femur (HCC) 11/29/2016  . Difficult airway for intubation 11/29/2016  . SIRS (systemic inflammatory response syndrome) (HCC) 05/25/2016  . MDD (major depressive disorder), recurrent episode, moderate (HCC)   . Aspiration pneumonia (HCC) 05/17/2016  . Diabetic ketoacidosis without coma (HCC)   . Sepsis secondary to UTI (HCC)   . Acute hypoxemic respiratory failure (HCC)   . Chronic combined systolic and diastolic congestive heart failure (HCC)   . Hypoglycemia   . Chronic diastolic heart failure (HCC)   . Esophageal reflux   . S/P AVR (aortic valve replacement)   . Constipation 11/23/2015  . Pressure ulcer 08/02/2015  . Diabetic ketoacidosis with coma associated with other specified diabetes mellitus (HCC)   . Closed left ankle fracture 07/01/2015  . Trimalleolar fracture of left ankle 07/01/2015  . Essential hypertension   . Diabetes type 1, uncontrolled (HCC)   . S/P aortic valve replacement   . Blood poisoning   . Spastic hemiplegia affecting nondominant side (HCC) 11/29/2014  . Dysphagia, pharyngoesophageal phase 10/04/2014  . Depression 04/29/2014  . CVA (cerebral infarction) 03/12/2014  . Chronic anticoagulation 07/28/2013  . CAD (coronary artery disease)   . Ischemic stroke (HCC)   . Hyperlipidemia   . Rheumatoid arthritis (HCC)    Past Medical History:  Past Medical History:  Diagnosis Date  . Allergy   . Anxiety   . Aortic stenosis   . CAD (coronary artery disease)   .  CHF (congestive heart failure) (HCC)   . Depression   . Diabetes (HCC) dx'd 1982   "went straight to insulin"  . Dyslipidemia   . GERD (gastroesophageal reflux disease)   . Heart murmur   . HTN (hypertension)   . Hypercholesteremia   . Myocardial infarction (HCC) 12/2014  . Obesity   . Pneumonia 11/2014; 12/2014  . Rheumatoid arthritis(714.0)    "hands" (07/01/2015)  . Stroke syndrome 1995; 2001   "when my son was born; problems w/speech and L hand since then" (01/19/2015)  . Thrombophlebitis    Past Surgical History:  Past Surgical History:  Procedure Laterality Date  . AORTIC VALVE REPLACEMENT (AVR)/CORONARY ARTERY BYPASS GRAFTING (CABG)  "2014"   [name] 03/08/2014  . CARDIAC CATHETERIZATION  12/08/2014  . CATARACT EXTRACTION Left   . CESAREAN SECTION  1995  . CORONARY ANGIOPLASTY WITH STENT PLACEMENT  12/09/2014  . CORONARY ARTERY BYPASS GRAFT    . FOOT FRACTURE SURGERY    . FRACTURE SURGERY    . INTRAMEDULLARY (IM) NAIL INTERTROCHANTERIC Left 11/30/2016   Procedure: INTRAMEDULLARY (IM) NAIL LEFT HIP;  Surgeon: Yolonda Kida, MD;  Location: [facility] OR;  Service: Orthopedics;  Laterality: Left;  . KNEE ARTHROSCOPY Right   . LEFT HEART CATHETERIZATION WITH CORONARY ANGIOGRAM N/A 12/08/2014   Procedure: LEFT HEART CATHETERIZATION WITH CORONARY ANGIOGRAM;  Surgeon: Iran Ouch, MD;  Location: [facility] CATH LAB;  Service: Cardiovascular;  Laterality: N/A;  . LOWER EXTREMITY ANGIOGRAPHY N/A 05/08/2018   Procedure: LOWER EXTREMITY ANGIOGRAPHY;  Surgeon: Fransisco Hertz, MD;  Location: [facility] INVASIVE CV LAB;  Service: Cardiovascular;  Laterality: N/A;  . LOWER EXTREMITY ANGIOGRAPHY N/A 06/05/2018   Procedure: LOWER EXTREMITY ANGIOGRAPHY - Right Leg;  Surgeon: Fransisco Hertz, MD;  Location: [facility] INVASIVE CV LAB;  Service: Cardiovascular;  Laterality: N/A;  . NEUROPLASTY / TRANSPOSITION MEDIAN NERVE AT CARPAL TUNNEL    . ORIF ANKLE FRACTURE Left 07/04/2015   Procedure: OPEN REDUCTION INTERNAL FIXATION  (ORIF)  TRIMAL ANKLE FRACTURE;  Surgeon: [name], MD;  Location: [facility] OR;  Service: Orthopedics;  Laterality: Left;  . PERCUTANEOUS CORONARY STENT INTERVENTION (PCI-S) N/A 12/09/2014   Procedure: PERCUTANEOUS CORONARY STENT INTERVENTION (PCI-S);  Surgeon: [name], MD;  Location: [facility] CATH LAB;  Service: Cardiovascular;  Laterality: N/A;  . PERIPHERAL VASCULAR BALLOON ANGIOPLASTY Left 05/08/2018   Procedure: PERIPHERAL VASCULAR BALLOON ANGIOPLASTY;  Surgeon: Fransisco Hertz, MD;  Location: [facility] INVASIVE CV LAB;  Service: Cardiovascular;  Laterality: Left;  Left anterior tibial, and tibioperoneal trunk  . PERIPHERAL VASCULAR INTERVENTION Left 05/08/2018   Procedure: PERIPHERAL VASCULAR INTERVENTION;  Surgeon: Fransisco Hertz, MD;  Location: [facility] INVASIVE CV LAB;  Service: Cardiovascular;  Laterality: Left;  Popliteal artery  . PERIPHERAL VASCULAR INTERVENTION Right 06/05/2018   Procedure: PERIPHERAL VASCULAR INTERVENTION;  Surgeon: Fransisco Hertz, MD;  Location: [facility] INVASIVE CV LAB;  Service: Cardiovascular;  Laterality: Right;  . TUBAL LIGATION  1995   HPI:  57 y.o. female with medical history significant for aortic valve replacement on Coumadin, depression, hypertension, insulin-dependent diabetes mellitus, and multiple strokes with residual deficits, admitted from her SNF after suffering a ground-level mechanical fall 2 days PTA during which she hit the back of her head on the floor.  Patient reports that she was on the toilet at her nursing facility, got up to return to her bed, tripped over an assistive device, and fell to the ground, hitting the back of her head on the floor.  MRI large rt PCA CVA with remote bil MCA & PCA infarcts and advanced atrophy from small vessel disease   Assessment / Plan / Recommendation Clinical Impression  Pt has baseline cognitive linguistic deficits and currently resides in SNF as a result of multiple CVAs. Most recent CVA has impiared her vision. She is able to  intermittently see objects but benefits from nursing assistance to place items (such as cup in her hand). No one present to endorase baseline ability but pt is able to provide appropriate information related to herself and is able to communicate wants and needs effectively. Pt states that she is baseline ability (with exception of acute visual impairments). NT was also in room and provides that pt is functional in her cognitive ability. No further skilled ST  is indicated at this time. ST to sign off.    SLP Assessment  SLP Recommendation/Assessment: Patient does not need any further Speech Lanaguage Pathology Services SLP Visit Diagnosis: Cognitive communication deficit (R41.841)    Follow Up Recommendations  None          SLP Evaluation Cognition  Overall Cognitive Status: No family/caregiver present to determine baseline cognitive functioning Arousal/Alertness: Awake/alert Orientation Level: Oriented X4 Attention: Selective Selective Attention: Appears intact       Comprehension  Auditory Comprehension Overall Auditory Comprehension: Impaired at baseline Visual Recognition/Discrimination Discrimination: Not tested Reading Comprehension Reading Status: Not tested    Expression Expression Primary Mode of Expression: Verbal Verbal Expression Overall Verbal Expression: Appears within functional limits for tasks assessed Written Expression Dominant Hand: Right Written Expression: Not tested   Oral / Motor  Oral  Motor/Sensory Function Overall Oral Motor/Sensory Function: Within functional limits Motor Speech Overall Motor Speech: Impaired at baseline Respiration: Within functional limits Phonation: Normal Articulation: Impaired(currently at baseline) Level of Impairment: Conversation Intelligibility: (at baseline)   GO                    Wilman Tucker 07/07/2018, 1:24 PM

## 2018-07-07 NOTE — Progress Notes (Signed)
  Echocardiogram 2D Echocardiogram has been performed.  Madison [name] 07/07/2018, 2:52 PM

## 2018-07-07 NOTE — Progress Notes (Signed)
ANTICOAGULATION CONSULT NOTE - Follow Up Consult  Pharmacy Consult for Warfarin Indication: Hx CVA and mechanical AVR  Allergies  Allergen Reactions  . Tape Other (See Comments)    Can burn skin if left on too long. Reaction:Burning  Can burn skin if left on too long.  . Celebrex [Celecoxib] Rash  . Detrol [Tolterodine] Hives    Patient Measurements: Height: 5\' 3"  (160 cm) Weight: 174 lb 13.2 oz (79.3 kg) IBW/kg (Calculated) : 52.4  Vital Signs: Temp: 99.6 F (37.6 C) (08/19 1140) Temp Source: Oral (08/19 1140) BP: 100/50 (08/19 1140) Pulse Rate: 75 (08/19 1140)  Labs: Recent Labs    07/05/18 1911 07/06/18 0329 07/07/18 0620  HGB 11.9*  --   --   HCT 37.5  --   --   PLT 228  --   --   LABPROT 24.3* 24.7* 33.7*  INR 2.20 2.26 3.35  CREATININE 1.00  --  0.99    Estimated Creatinine Clearance: 62.6 mL/min (by C-G formula based on SCr of 0.99 mg/dL).   Assessment: 57 y/o F on warfarin PTA for hx CVA and mechanical AVR here from a nursing facility s/p mechanical fall. CT with no bleeding but subacute infarct noted. Hgb 11.9. INR is no within goal range at 3.35, however this represents a large increase from yesterday after tow doses of 7.5mg   Home regimen: 5mg  most days and 3 mg on Thurs. No missed doses reported.   Goal of Therapy:  INR 2.5 - 3.5 Monitor platelets by anticoagulation protocol: Yes   Plan:  Hold warfarin tonight in light of subacute infarct.  Daily PT/INR Monitor for bleeding with subacute infarct noted.    Hayle Parisi A. 57, PharmD, BCPS Clinical Pharmacist [place] Pager: [phone] Please utilize Amion for appropriate phone number to reach the unit pharmacist [facility])   07/07/2018,12:08 PM

## 2018-07-07 NOTE — Progress Notes (Signed)
Stroke Team Progress Note   HPI ( per Dr Laurence Slate) Madison [name] is an 57 y.o. female's medical history of aortic valve replacement on Coumadin, diabetes mellitus, hypertension, multiple CVAs including bilateral hemispheres presents to the emergency department after suffering a mechanical fall. Head CT was performed which showed no evidence of subdural hemorrhage, however showed a subacute right PCA infarct.  Patient states that she has been having blurry vision in both eyes since the last 2 days has had significant trouble seeing. Her INR is 2.2 today  ( goal 2.5- 3.5), however when lower when checked on 7/18/ 2019 - subtherapeutic at 0.99.  Unclear if Coumadin was held at the time for recannulation of proximal tibial arteries.  Date last known well: 2 days ago tPA Given: No outside window on on warfarin Baseline MRS 4 SUBJECTIVE Patient stated that she had a fall because she slipped. She denies any new or focal neurological symptoms. 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   OBJECTIVE Most recent Vital Signs: Temp: 99.6 F (37.6 C) (08/19 1140) Temp Source: Oral (08/19 1140) BP: 100/50 (08/19 1140) Pulse Rate: 75 (08/19 1140) Respiratory Rate: 18 O2 Saturdation: 92%  CBG (last 3)  Recent Labs    07/06/18 2149 07/06/18 2239 07/07/18 1130  GLUCAP 360* 351* 128*       Studies:  CT no evidence of subdural hemorrhage, however showed a subacute right PCA infarct MRI Large acute to early subacute right PCA territory infarct.Associated petechial hemorrhage without frank hemorrhagic transformation. No significant regional mass effect.Extensive chronic ischemic changes with multiple remote bilateral MCA and PCA territory infarcts, with additional remote bilateralcerebellar infarcts. CTA not done ECHO pending LDL58 HbA1c 8.5  Physical Exam:   Pleasant middle-age Caucasian lady currently not in distress. . Afebrile. Head is nontraumatic. Neck is supple without bruit.     Cardiac exam no murmur or gallop. Lungs are clear to auscultation. Distal pulses are well felt. Neurological Exam :  Awake alert oriented 3 with normal speech and language function. Mild dysarthria. Follows three-step commands. Extraocular moments are full range. Decreased blink to threat on the left and suspect left partial hemianopsia. Mild left facial asymmetry. Tongue midline. Motor system exam left spastic hemiplegia with 2/5 left upper extremity strength with fixed finger contractures of the left hand significant weakness. 4/5 left lower extremity strength with ankle sustained clonus. Increased tone on the left compared to the right. Gait not tested. Sensation intact. ASSESSMENT Madison [name] is a 57 y.o. female with a   medical history of aortic valve replacement on Coumadin, diabetes mellitus, hypertension, multiple CVAs including bilateral hemispheres who presented with a mechanical fall MRI scan shows subacute right PCA infarct likely embolic in this has occurred despite optimal anticoagulation with INR sub therapeutic at 2.2.for mechanical heart valve  Hospital day # 2  TREATMENT/PLAN  I have personally examined this patient, reviewed notes, independently viewed imaging studies, participated in medical decision making and plan of care.ROS completed by me personally and pertinent positives fully documented  I have made any additions or clarifications directly to the above note. Recommend resume anticoagulation with warfarin with INR goal for aggressive between 2.5-3.5 for her mechanical aortic valve. No need for intracranial or extracranial vascular imaging. Check repeat echocardiogram results. Optimize risk factor modification. No family available at the bedside for discussion. Long discussion with the patient at the bedside and answered questions. Greater than 50% time during this 35 minute visit was spent on counseling and  coordination of care about an embolic stroke and discussing  treatment and prevention options and answering questions. Transfer back to [name] facility when bed available. Discussed with Dr. Theodoro Grist I spent   minutes in total face-to-face time with the patient, more than 50% of which was spent in counseling and coordination of care, reviewing test results, reviewing medication and discussing or reviewing the diagnosis of    , the prognosis and treatment options.   Delia Heady, MD Medical Director [facility] Pager: [phone] 07/07/2018 3:45 PM

## 2018-07-07 NOTE — Progress Notes (Signed)
PROGRESS NOTE    Madison [name]  GMW:[id number] DOB: January 08, 1961 DOA: 07/05/2018 PCP: Renford Dills, MD    Brief Narrative:  57 y.o. female with medical history significant for aortic valve replacement on Coumadin, depression, hypertension, insulin-dependent diabetes mellitus, and multiple strokes with residual deficits, now presenting from her SNF after suffering a ground-level mechanical fall, hitting the back of her head on the floor.  Patient reports that she was on the toilet at her nursing facility, got up to return to her bed, tripped over an assistive device, and fell to the ground, hitting the back of her head on the floor.  There is no loss of consciousness.  She reports having a headache for the past 2 days, not significantly changed since the fall.  She also reports blurred vision for the past 2 days, again no different after the fall.  She has not appreciated any fever and denies any neck pain or stiffness, denies rhinorrhea or sinus congestion, and denies cough, shortness of breath, chest pain, or palpitations.  ED Course: Upon arrival to the ED, patient is found to be febrile to 38 C, saturating adequately on room air, slightly tachycardic, and with stable blood pressure.  Chest x-ray is negative for edema or consolidation.  Noncontrast head CT is concerning for new subacute infarct of the right occipital lobe with mild edema and mass-effect, but no acute hemorrhage or fracture.  Chemistry panel is notable for sodium of 131 and glucose 326.  CBC features a leukocytosis to 10,700.  Urinalysis is suggestive of infection and sample was sent for culture.  INR is 2.2.  Patient was given morphine and a liter of normal saline in the ED.  Neurology was consulted and recommended a medical admission for further evaluation and management.  Assessment & Plan:   Principal Problem:   Ischemic stroke (HCC) Active Problems:   CAD (coronary artery disease)   Depression   Essential hypertension  S/P AVR (aortic valve replacement)   Sepsis secondary to UTI (HCC)   Insulin dependent diabetes mellitus (HCC)   Septic arthritis of knee, left (HCC)  1. Ischemic CVA  - Presents from SNF after a fall, hitting the back of her head on floor, no LOC, and preceded by 2 days of headache and blurred vision   - Head CT concerning for new subacute infarct involving right occipital lobe with mild edema and mass-effect, no hemorrhage or fracture  - Neurology was consulted  - MRI obtained and reviewed. Findings worrisome for large R PCA territory infarct - Per Neurology, no need for repeat dopplers or MRA head. Neurology recommends repeat 2d echo to r/o intracardiac thrombus. Ordered and pending -Pt seen by PT with recommendation for SNF, SW consulted  2. Sepsis secondary to ecoli UTI, present on admit - Presents after a fall, reports headache, blurred vision, and malaise  - Found to have fever, tachycardia, and leukocytosis  - UA suggestive of infection and sample was sent for cultures; CXR without focal infiltrate, no URI symptoms, abd exam is benign, no meningismus  - Patient currently on empiric rocephin. Culture pos for ecoli, pending sensitivities -Febrile this AM. Continue tylenol as needed  3. CAD - denies chest pains - Continue statin and Plavix per Neurology recs - BP meds were initially held to allow for permissive HTN  4. Aortic valve replacement  - No hemorrhage on CT  - Continue warfarin per pharmacy dosing - no signs of acute blood loss noted  5. Insulin-dependent DM  -  A1c was 9.8% last year  - Managed at SNF with Levemir 25 units qAM and 10 units qPM, as well as Novolog 12 units TID  -glucose in the 300-100's range -currently on Levemir 17 units AM, 7 units PM, 6 units meal coverage. -will increase to Levemir 20 units AM, 10 units PM, 10 units meal coverage  6. Hypertension  - BP remains at goal -presently not on bp meds   7. Depression  - Continue Lexapro and  trazodone as patient tolerates  8. Chronic anticoagulation -coumadin continued per above  9. Headache -Little relief with tylenol -Improved with one time dose of toradol  DVT prophylaxis: Coumadin per pharmacy dosing Code Status: Full Family Communication: Pt in room, family not at bedside Disposition Plan: SNF, timing uncertain  Consultants:   Neurology  Procedures:     Antimicrobials: Anti-infectives (From admission, onward)   Start     Dose/Rate Route Frequency Ordered Stop   07/05/18 2200  cefTRIAXone (ROCEPHIN) 1 g in sodium chloride 0.9 % 100 mL IVPB     1 g 200 mL/hr over 30 Minutes Intravenous Every 24 hours 07/05/18 2148        Subjective: Without complaints this AM. Fevers noted this morning  Objective: Vitals:   07/07/18 0024 07/07/18 0418 07/07/18 0738 07/07/18 1140  BP: (!) 117/45 (!) 155/67 (!) 126/56 (!) 100/50  Pulse: 78 81 75 75  Resp: 18 18 18 18   Temp: 97.9 F (36.6 C) 98.3 F (36.8 C) (!) 101.8 F (38.8 C) 99.6 F (37.6 C)  TempSrc: Oral Oral Oral Oral  SpO2: 95% 96% 92%   Weight:      Height:       No intake or output data in the 24 hours ending 07/07/18 1633 Filed Weights   07/06/18 0055  Weight: 79.3 kg    Examination: General exam: Conversant, in no acute distress Respiratory system: normal chest rise, clear, no audible wheezing Cardiovascular system: regular rhythm, s1-s2 Gastrointestinal system: Nondistended, nontender, pos BS Central nervous system: No seizures, no tremors Extremities: No cyanosis, no joint deformities Skin: No rashes, no pallor Psychiatry: Affect normal // no auditory hallucinations   Data Reviewed: I have personally reviewed following labs and imaging studies  CBC: Recent Labs  Lab 07/05/18 1911  WBC 10.7*  NEUTROABS 8.3*  HGB 11.9*  HCT 37.5  MCV 91.9  PLT 228   Basic Metabolic Panel: Recent Labs  Lab 07/05/18 1911 07/07/18 0620  NA 131* 136  K 4.5 4.5  CL 95* 102  CO2 24 25  GLUCOSE  326* 163*  BUN 11 12  CREATININE 1.00 0.99  CALCIUM 9.0 9.1   GFR: Estimated Creatinine Clearance: 62.6 mL/min (by C-G formula based on SCr of 0.99 mg/dL). Liver Function Tests: No results for input(s): AST, ALT, ALKPHOS, BILITOT, PROT, ALBUMIN in the last 168 hours. No results for input(s): LIPASE, AMYLASE in the last 168 hours. No results for input(s): AMMONIA in the last 168 hours. Coagulation Profile: Recent Labs  Lab 07/05/18 1911 07/06/18 0329 07/07/18 0620  INR 2.20 2.26 3.35   Cardiac Enzymes: No results for input(s): CKTOTAL, CKMB, CKMBINDEX, TROPONINI in the last 168 hours. BNP (last 3 results) No results for input(s): PROBNP in the last 8760 hours. HbA1C: Recent Labs    07/06/18 0329  HGBA1C 8.5*   CBG: Recent Labs  Lab 07/06/18 1130 07/06/18 1648 07/06/18 2149 07/06/18 2239 07/07/18 1130  GLUCAP 212* 139* 360* 351* 128*   Lipid Profile: Recent Labs  07/06/18 0329  CHOL 131  HDL 55  LDLCALC 58  TRIG 92  CHOLHDL 2.4   Thyroid Function Tests: No results for input(s): TSH, T4TOTAL, FREET4, T3FREE, THYROIDAB in the last 72 hours. Anemia Panel: No results for input(s): VITAMINB12, FOLATE, FERRITIN, TIBC, IRON, RETICCTPCT in the last 72 hours. Sepsis Labs: Recent Labs  Lab 07/05/18 2205 07/06/18 0128  LATICACIDVEN 1.3 2.8*    Recent Results (from the past 240 hour(s))  Urine culture     Status: Abnormal (Preliminary result)   Collection Time: 07/05/18  7:20 PM  Result Value Ref Range Status   Specimen Description URINE, RANDOM  Final   Special Requests NONE  Final   Culture (A)  Final    >=100,000 COLONIES/mL ESCHERICHIA COLI SUSCEPTIBILITIES TO FOLLOW Performed at [facility], [address], [place], [place] [address]    Report Status PENDING  Incomplete  Culture, blood (x 2)     Status: None (Preliminary result)   Collection Time: 07/05/18 10:06 PM  Result Value Ref Range Status   Specimen Description BLOOD RIGHT FOREARM   Final   Special Requests   Final    BOTTLES DRAWN AEROBIC AND ANAEROBIC Blood Culture results may not be optimal due to an inadequate volume of blood received in culture bottles   Culture   Final    NO GROWTH 1 DAY Performed at [facility], [address], [place], [place] [address]    Report Status PENDING  Incomplete  Culture, blood (x 2)     Status: None (Preliminary result)   Collection Time: 07/05/18 10:27 PM  Result Value Ref Range Status   Specimen Description BLOOD RIGHT HAND  Final   Special Requests   Final    BOTTLES DRAWN AEROBIC ONLY Blood Culture results may not be optimal due to an inadequate volume of blood received in culture bottles   Culture   Final    NO GROWTH 1 DAY Performed at [facility], [address] [place], [place] [address]    Report Status PENDING  Incomplete     Radiology Studies: Dg Chest 2 View  Result Date: 07/05/2018 CLINICAL DATA:  Pain following fall EXAM: CHEST - 2 VIEW COMPARISON:  November 29, 2016 FINDINGS: There is no edema or consolidation. Heart size and pulmonary vascularity are normal. No adenopathy. Patient is status post aortic valve replacement. No adenopathy. No bone lesions. No pneumothorax. IMPRESSION: Status post aortic valve replacement. No edema or consolidation. Heart size normal. Electronically Signed   By: Bretta Bang III M.D.   On: 07/05/2018 18:21   Ct Head Wo Contrast  Result Date: 07/05/2018 CLINICAL DATA:  57 y/o  F; fall today.  Headache and dizziness. EXAM: CT HEAD WITHOUT CONTRAST TECHNIQUE: Contiguous axial images were obtained from the base of the skull through the vertex without intravenous contrast. COMPARISON:  04/30/2016 CT head FINDINGS: Brain: There is a region of hypoattenuation with loss of gray-white differentiation and mild local mass effect of the right occipital lobe which is new from the prior CT of head and compatible with subacute infarction. There multiple small cortical infarctions  within the bilateral frontal lobes, parietal lobes, except a lobes as well as a large chronic infarction in the right posterior MCA distribution. There are very small chronic infarctions in bilateral cerebellar hemispheres. There are advanced chronic microvascular ischemic changes of white matter and volume loss of the brain. No hemorrhage, extra-axial collection, hydrocephalus, or herniation. Vascular: Calcific atherosclerosis of  the carotid siphons and vertebral arteries. No hyperdense vessel identified. Skull: Normal. Negative for fracture or focal lesion. Sinuses/Orbits: Partial left mastoid opacification. Normal aeration of the mastoid air cells with a small right maxillary sinus fluid level. Orbits are unremarkable. Other: None. IMPRESSION: 1. New subacute infarction of the right occipital lobe with mild edema and mass effect. 2. No acute hemorrhage or calvarial fracture. 3. Numerous chronic infarcts throughout the supratentorial and infratentorial brain including a large chronic right posterior MCA infarct. 4. Partial left mastoid opacification. Small right maxillary fluid level may represent acute sinusitis. Electronically Signed   By: Mitzi Hansen M.D.   On: 07/05/2018 18:39   Mr Brain Wo Contrast  Result Date: 07/06/2018 CLINICAL DATA:  Initial evaluation for acute stroke. EXAM: MRI HEAD WITHOUT CONTRAST TECHNIQUE: Multiplanar, multiecho pulse sequences of the brain and surrounding structures were obtained without intravenous contrast. COMPARISON:  Prior CT from 07/05/2018. FINDINGS: Brain: Diffuse prominence of the CSF containing spaces compatible with generalized age-related cerebral atrophy. Patchy and confluent T2/FLAIR hyperintensity within the periventricular and deep white matter both cerebral hemispheres consistent with chronic small vessel ischemic change, advanced in nature. Extensive encephalomalacia throughout the right cerebral hemisphere consistent with large remote right MCA  territory infarct. Additional scattered multifocal left MCA territory infarcts involving the left frontal and parietal lobes. Remote left PCA territory infarct involving the left occipital lobe. Scatter remote bilateral cerebellar infarcts. Small remote lacunar infarcts present within the bilateral thalami. Large confluent area of restricted diffusion involving the parasagittal right parieto-occipital region consistent with acute to early subacute right PCA territory infarct (series 5, image 69). Patchy involvement of the right thalamus. Associated petechial hemorrhage without frank hemorrhagic transformation (series 12, image 23). Associated localized gyral swelling and edema without significant regional mass effect. No other evidence for acute or subacute infarct. No mass lesion, midline shift or mass effect. Mild ex vacuo dilatation of the left lateral ventricle related to chronic left PCA territory infarct without hydrocephalus. No extra-axial fluid collection. Pituitary gland normal. Vascular: Abnormal flow void within the right ICA to the level of the terminus, likely occluded. Major intracranial vascular flow voids otherwise maintained. Skull and upper cervical spine: Craniocervical junction normal. Upper cervical spine within normal limits. Bone marrow signal intensity normal. No scalp soft tissue abnormality. Sinuses/Orbits: Globes and orbital soft tissues within normal limits. Mild mucosal thickening with small air-fluid level within the right maxillary sinus, suggesting possible acute sinusitis. Left mastoid effusion noted. Trace opacity right mastoid air cells. Other: None. IMPRESSION: 1. Large acute to early subacute right PCA territory infarct. Associated petechial hemorrhage without frank hemorrhagic transformation. No significant regional mass effect. 2. Extensive chronic ischemic changes with multiple remote bilateral MCA and PCA territory infarcts, with additional remote bilateral cerebellar  infarcts. 3. Abnormal flow void within the right ICA to the terminus, likely occluded. 4. Advanced atrophy with chronic small vessel ischemic disease. Electronically Signed   By: Rise Mu M.D.   On: 07/06/2018 00:48    Scheduled Meds: . calcitonin (salmon)  1 spray Alternating Nares Daily  . clopidogrel  75 mg Oral Daily  . darifenacin  7.5 mg Oral Daily  . docusate sodium  100 mg Oral BID  . escitalopram  20 mg Oral Daily  . gabapentin  400 mg Oral TID  . insulin aspart  0-5 Units Subcutaneous QHS  . insulin aspart  0-9 Units Subcutaneous TID WC  . insulin aspart  6 Units Subcutaneous TID WC  . insulin detemir  17  Units Subcutaneous Daily   And  . insulin detemir  7 Units Subcutaneous QHS  . ketorolac  30 mg Intravenous Once  . Melatonin  6 mg Oral QHS  . pantoprazole  40 mg Oral Q0600  . pravastatin  40 mg Oral q1800  . traZODone  50 mg Oral QHS  . Warfarin - Pharmacist Dosing Inpatient   Does not apply q1800   Continuous Infusions: . cefTRIAXone (ROCEPHIN)  IV 1 g (07/06/18 2144)     LOS: 2 days   Rickey Barbara, MD [facility] Pager [phone]  If 7PM-7AM, please contact night-coverage [email or web address] Password [facility] 07/07/2018, 4:33 PM

## 2018-07-07 NOTE — NC FL2 (Signed)
[place] MEDICAID FL2 LEVEL OF CARE SCREENING TOOL     IDENTIFICATION  Patient Name: Madison [name] Birthdate: [birth year] Sex: female Admission Date (Current Location): 07/05/2018  [facility] and [place] Number:  Producer, television/film/video and Address:  [facility], [address], [place], [place] [address]      Provider Number: [id number]  Attending Physician Name and Address:  Jerald Kief, MD  Relative Name and Phone Number:       Current Level of Care: Hospital Recommended Level of Care: Skilled Nursing Facility Prior Approval Number:    Date Approved/Denied:   PASRR Number:    Discharge Plan: SNF    Current Diagnoses: Patient Active Problem List   Diagnosis Date Noted  . Septic arthritis of knee, left (HCC) 07/05/2018  . Atherosclerosis of artery of extremity with ulceration (HCC) 04/28/2018  . Insulin dependent diabetes mellitus (HCC) 03/07/2017  . Closed displaced intertrochanteric fracture of left femur (HCC) 11/29/2016  . Difficult airway for intubation 11/29/2016  . SIRS (systemic inflammatory response syndrome) (HCC) 05/25/2016  . MDD (major depressive disorder), recurrent episode, moderate (HCC)   . Aspiration pneumonia (HCC) 05/17/2016  . Diabetic ketoacidosis without coma (HCC)   . Sepsis secondary to UTI (HCC)   . Acute hypoxemic respiratory failure (HCC)   . Chronic combined systolic and diastolic congestive heart failure (HCC)   . Hypoglycemia   . Chronic diastolic heart failure (HCC)   . Esophageal reflux   . S/P AVR (aortic valve replacement)   . Constipation 11/23/2015  . Pressure ulcer 08/02/2015  . Diabetic ketoacidosis with coma associated with other specified diabetes mellitus (HCC)   . Closed left ankle fracture 07/01/2015  . Trimalleolar fracture of left ankle 07/01/2015  . Essential hypertension   . Diabetes type 1, uncontrolled (HCC)   . S/P aortic valve replacement   . Blood poisoning   . Spastic hemiplegia  affecting nondominant side (HCC) 11/29/2014  . Dysphagia, pharyngoesophageal phase 10/04/2014  . Depression 04/29/2014  . CVA (cerebral infarction) 03/12/2014  . Chronic anticoagulation 07/28/2013  . CAD (coronary artery disease)   . Ischemic stroke (HCC)   . Hyperlipidemia   . Rheumatoid arthritis (HCC)     Orientation RESPIRATION BLADDER Height & Weight     Self, Time, Situation, Place  Normal Continent Weight: 174 lb 13.2 oz (79.3 kg) Height:  5\' 3"  (160 cm)  BEHAVIORAL SYMPTOMS/MOOD NEUROLOGICAL BOWEL NUTRITION STATUS      Continent Diet(heart healthy, carb modified)  AMBULATORY STATUS COMMUNICATION OF NEEDS Skin   Limited Assist Verbally Normal                       Personal Care Assistance Level of Assistance  Bathing, Feeding, Dressing Bathing Assistance: Limited assistance Feeding assistance: Independent Dressing Assistance: Limited assistance     Functional Limitations Info  Sight, Hearing, Speech Sight Info: Impaired Hearing Info: Adequate Speech Info: Adequate    SPECIAL CARE FACTORS FREQUENCY  PT (By licensed PT), OT (By licensed OT)     PT Frequency: 5x/wk OT Frequency: 5x/wk            Contractures Contractures Info: Not present    Additional Factors Info  Code Status, Allergies, Insulin Sliding Scale, Psychotropic Code Status Info: Full Allergies Info: Tape, Celebrex Celecoxib, Detrol Tolterodine Psychotropic Info: Lexapro 20mg  daily Insulin Sliding Scale Info: 0-9 units 3x/day with meals; 0-5 units daily at bed; 6 units 3x/day with meals; Levemir 17 units  daily; Levemir 7 units daily at bed       Current Medications (07/07/2018):  This is the current hospital active medication list Current Facility-Administered Medications  Medication Dose Route Frequency Provider Last Rate Last Dose  . acetaminophen (TYLENOL) tablet 650 mg  650 mg Oral Q4H PRN Opyd, Lavone Neri, MD   650 mg at 07/06/18 1759   Or  . acetaminophen (TYLENOL) solution 650  mg  650 mg Per Tube Q4H PRN Opyd, Lavone Neri, MD       Or  . acetaminophen (TYLENOL) suppository 650 mg  650 mg Rectal Q4H PRN Opyd, Lavone Neri, MD      . benzonatate (TESSALON) capsule 100 mg  100 mg Oral TID PRN Opyd, Lavone Neri, MD      . calcitonin (salmon) (MIACALCIN/FORTICAL) nasal spray 1 spray  1 spray Alternating Nares Daily Opyd, Lavone Neri, MD   1 spray at 07/07/18 1024  . cefTRIAXone (ROCEPHIN) 1 g in sodium chloride 0.9 % 100 mL IVPB  1 g Intravenous Q24H Opyd, Lavone Neri, MD 200 mL/hr at 07/06/18 2144 1 g at 07/06/18 2144  . clopidogrel (PLAVIX) tablet 75 mg  75 mg Oral Daily Opyd, Lavone Neri, MD   75 mg at 07/07/18 1024  . darifenacin (ENABLEX) 24 hr tablet 7.5 mg  7.5 mg Oral Daily Opyd, Lavone Neri, MD   7.5 mg at 07/07/18 1024  . docusate sodium (COLACE) capsule 100 mg  100 mg Oral BID Briscoe Deutscher, MD   100 mg at 07/07/18 0819  . escitalopram (LEXAPRO) tablet 20 mg  20 mg Oral Daily Opyd, Lavone Neri, MD   20 mg at 07/07/18 1024  . gabapentin (NEURONTIN) capsule 400 mg  400 mg Oral TID Briscoe Deutscher, MD   400 mg at 07/07/18 1321  . HYDROcodone-acetaminophen (NORCO/VICODIN) 5-325 MG per tablet 1 tablet  1 tablet Oral Q4H PRN Opyd, Lavone Neri, MD   1 tablet at 07/07/18 0819  . insulin aspart (novoLOG) injection 0-5 Units  0-5 Units Subcutaneous QHS Briscoe Deutscher, MD   5 Units at 07/06/18 2153  . insulin aspart (novoLOG) injection 0-9 Units  0-9 Units Subcutaneous TID WC Opyd, Lavone Neri, MD   1 Units at 07/07/18 1212  . insulin aspart (novoLOG) injection 6 Units  6 Units Subcutaneous TID WC Opyd, Lavone Neri, MD   6 Units at 07/07/18 1211  . insulin detemir (LEVEMIR) injection 17 Units  17 Units Subcutaneous Daily Opyd, Lavone Neri, MD   17 Units at 07/07/18 1024   And  . insulin detemir (LEVEMIR) injection 7 Units  7 Units Subcutaneous QHS Briscoe Deutscher, MD   7 Units at 07/06/18 2150  . ketorolac (TORADOL) 30 MG/ML injection 30 mg  30 mg Intravenous Once Jerald Kief, MD      .  Melatonin TABS 6 mg  6 mg Oral QHS Opyd, Lavone Neri, MD   6 mg at 07/06/18 2144  . pantoprazole (PROTONIX) EC tablet 40 mg  40 mg Oral Q0600 Briscoe Deutscher, MD   40 mg at 07/07/18 0642  . pravastatin (PRAVACHOL) tablet 40 mg  40 mg Oral q1800 Opyd, Lavone Neri, MD   40 mg at 07/06/18 1755  . senna-docusate (Senokot-S) tablet 1 tablet  1 tablet Oral QHS PRN Opyd, Lavone Neri, MD      . traZODone (DESYREL) tablet 50 mg  50 mg Oral QHS Opyd, Lavone Neri, MD   50 mg at 07/06/18 2145  .  Warfarin - Pharmacist Dosing Inpatient   Does not apply q1800 [facility]         Discharge Medications: Please see discharge summary for a list of discharge medications.  Relevant Imaging Results:  Relevant Lab Results:   Additional Information SS#: [id number]  Baldemar Lenis, LCSW

## 2018-07-07 NOTE — Evaluation (Signed)
Occupational Therapy Evaluation Patient Details Name: Madison [name] MRN: [id number] DOB: 02-15-1961 Today's Date: 07/07/2018    History of Present Illness 57 y.o. female with medical history significant for aortic valve replacement on Coumadin, depression, hypertension, insulin-dependent diabetes mellitus, and multiple strokes with residual deficits, admitted from her SNF after suffering a ground-level mechanical fall 2 days PTA during which she hit the back of her head on the floor.  Patient reports that she was on the toilet at her nursing facility, got up to return to her bed, tripped over an assistive device, and fell to the ground, hitting the back of her head on the floor.  MRI large rt PCA CVA with remote bil MCA & PCA infarcts and advanced atrophy from small vessel disease.    Clinical Impression   Pt used a cane or w/c for mobility, self fed, groomed and participated in UB ADL at her SNF prior to admission. She presents with low visual acuity and appears to have a L visual field cut. She requires min to total assist for ADL and minimal hand held assist to ambulate in the absence of her cane and due to her impaired vision. Pt will return to her SNF upon discharge, will follow acutely.    Follow Up Recommendations  SNF;Supervision/Assistance - 24 hour    Equipment Recommendations       Recommendations for Other Services       Precautions / Restrictions Precautions Precautions: Fall(low vision, L hemi)      Mobility Bed Mobility Overal bed mobility: Needs Assistance Bed Mobility: Supine to Sit     Supine to sit: Mod assist     General bed mobility comments: assist to raise trunk  Transfers Overall transfer level: Needs assistance Equipment used: 1 person hand held assist Transfers: Sit to/from Stand Sit to Stand: Min assist         General transfer comment: min assist to steady, increased time    Balance                                            ADL either performed or assessed with clinical judgement   ADL Overall ADL's : Needs assistance/impaired Eating/Feeding: Maximal assistance;Sitting Eating/Feeding Details (indicate cue type and reason): vision interfering with ability to self feed Grooming: Brushing hair;Wash/dry hands;Wash/dry face;Sitting;Set up;Oral care;Total assistance   Upper Body Bathing: Maximal assistance;Sitting   Lower Body Bathing: Sit to/from stand;Maximal assistance   Upper Body Dressing : Maximal assistance;Sitting   Lower Body Dressing: Total assistance;Sit to/from stand   Toilet Transfer: Minimal assistance;Ambulation;BSC   Toileting- Architect and Hygiene: Sit to/from stand;Moderate assistance Toileting - Clothing Manipulation Details (indicate cue type and reason): for pericare, assist for thoroughness     Functional mobility during ADLs: Minimal assistance(hand held assist and maximal verbal cues due to low vision) General ADL Comments: assisted pt with sponge bathing, dressing, grooming, toileting and set pt up in chair     Vision Baseline Vision/History: (reports blurring of vision in days leading up to admission) Patient Visual Report: Blurring of vision(suspect a L field cut)       Perception Perception Comments: L visual field deficit, unclear if this is new to pt, very low vision   Praxis      Pertinent Vitals/Pain Pain Assessment: Faces Faces Pain Scale: Hurts a little bit Pain Location: headache Pain Descriptors / Indicators:  Aching Pain Intervention(s): Monitored during session     Hand Dominance Right   Extremity/Trunk Assessment Upper Extremity Assessment Upper Extremity Assessment: LUE deficits/detail LUE Deficits / Details: hemiplegia from previous CVA LUE Coordination: decreased fine motor;decreased gross motor(no functional use)   Lower Extremity Assessment Lower Extremity Assessment: Defer to PT evaluation       Communication  Communication Communication: Expressive difficulties(slightly slurred speech)   Cognition Arousal/Alertness: Awake/alert Behavior During Therapy: Flat affect Overall Cognitive Status: No family/caregiver present to determine baseline cognitive functioning                                     General Comments       Exercises     Shoulder Instructions      Home Living Family/patient expects to be discharged to:: Skilled nursing facility   Available Help at Discharge: Skilled Nursing Facility Type of Home: Skilled Nursing Facility                           Additional Comments: pt is a resident of [facility]      Prior Functioning/Environment Level of Independence: Needs assistance  Gait / Transfers Assistance Needed: uses cane or w/c ADL's / Homemaking Assistance Needed: self feeds, participates in grooming and toileting, dependent in bathing and dressing            OT Problem List: Decreased strength;Decreased activity tolerance;Impaired balance (sitting and/or standing);Decreased safety awareness;Impaired vision/perception;Decreased coordination;Decreased cognition;Decreased knowledge of use of DME or AE;Impaired UE functional use      OT Treatment/Interventions: Self-care/ADL training;Visual/perceptual remediation/compensation;Patient/family education;DME and/or AE instruction    OT Goals(Current goals can be found in the care plan section) Acute Rehab OT Goals Patient Stated Goal: return to SNF ADL Goals Pt Will Perform Eating: with min assist;sitting(using adaptive techniques and high visual contrast) Pt Will Perform Grooming: with supervision;sitting(at sink) Pt Will Transfer to Toilet: ambulating;bedside commode;with min assist(with cane) Pt Will Perform Toileting - Clothing Manipulation and hygiene: with supervision;sit to/from stand Additional ADL Goal #1: Pt will perform bed mobility with min assist in preparation for ADL.  OT  Frequency: Min 2X/week   Barriers to D/C:            Co-evaluation              AM-PAC PT "6 Clicks" Daily Activity     Outcome Measure Help from another person eating meals?: Total Help from another person taking care of personal grooming?: A Lot Help from another person toileting, which includes using toliet, bedpan, or urinal?: A Lot Help from another person bathing (including washing, rinsing, drying)?: A Lot Help from another person to put on and taking off regular upper body clothing?: A Lot Help from another person to put on and taking off regular lower body clothing?: Total 6 Click Score: 10   End of Session Equipment Utilized During Treatment: Gait belt Nurse Communication: Mobility status  Activity Tolerance: Patient tolerated treatment well Patient left: in chair;with call bell/phone within reach;with chair alarm set  OT Visit Diagnosis: Other abnormalities of gait and mobility (R26.89);Unsteadiness on feet (R26.81);Hemiplegia and hemiparesis;Muscle weakness (generalized) (M62.81);History of falling (Z91.81) Hemiplegia - Right/Left: Left Hemiplegia - dominant/non-dominant: Non-Dominant Hemiplegia - caused by: Cerebral infarction                Time: 6468-0321 OT Time Calculation (min): 40 min Charges:  OT General Charges $OT Visit: 1 Visit OT Evaluation $OT Eval Moderate Complexity: 1 Mod OT Treatments $Self Care/Home Management : 23-37 mins  07/07/2018 Martie Round, OTR/L Pager: [phone]  Iran Planas [place] Bailiff 07/07/2018, 4:27 PM

## 2018-07-07 NOTE — Care Management Note (Signed)
Case Management Note  Patient Details  Name: Madison [name] MRN: [id number] Date of Birth: [birth year]  Subjective/Objective:         Pt admitted with Stroke. She is from [facility] SNF.            Action/Plan: Plan is for patient to return to [facility] when medically ready. CM following.  Expected Discharge Date:                  Expected Discharge Plan:  Skilled Nursing Facility  In-House Referral:  Clinical Social Work  Discharge planning Services     Post Acute Care Choice:    Choice offered to:     DME Arranged:    DME Agency:     HH Arranged:    HH Agency:     Status of Service:  In process, will continue to follow  If discussed at Long Length of Stay Meetings, dates discussed:    Additional Comments:  Kermit Balo, RN 07/07/2018, 1:41 PM

## 2018-07-08 LAB — URINE CULTURE

## 2018-07-08 LAB — CBC
HEMATOCRIT: 32.8 % — AB (ref 36.0–46.0)
Hemoglobin: 10.4 g/dL — ABNORMAL LOW (ref 12.0–15.0)
MCH: 29 pg (ref 26.0–34.0)
MCHC: 31.7 g/dL (ref 30.0–36.0)
MCV: 91.4 fL (ref 78.0–100.0)
Platelets: 205 10*3/uL (ref 150–400)
RBC: 3.59 MIL/uL — AB (ref 3.87–5.11)
RDW: 13 % (ref 11.5–15.5)
WBC: 5.3 10*3/uL (ref 4.0–10.5)

## 2018-07-08 LAB — GLUCOSE, CAPILLARY
GLUCOSE-CAPILLARY: 261 mg/dL — AB (ref 70–99)
GLUCOSE-CAPILLARY: 222 mg/dL — AB (ref 70–99)
Glucose-Capillary: 212 mg/dL — ABNORMAL HIGH (ref 70–99)
Glucose-Capillary: 82 mg/dL (ref 70–99)
Glucose-Capillary: 93 mg/dL (ref 70–99)

## 2018-07-08 LAB — PROTIME-INR
INR: 2.86
Prothrombin Time: 29.7 seconds — ABNORMAL HIGH (ref 11.4–15.2)

## 2018-07-08 MED ORDER — INSULIN DETEMIR 100 UNIT/ML ~~LOC~~ SOLN
12.0000 [IU] | Freq: Every day | SUBCUTANEOUS | Status: DC
  Administered 2018-07-08: 12 [IU] via SUBCUTANEOUS
  Filled 2018-07-08 (×2): qty 0.12

## 2018-07-08 MED ORDER — INSULIN DETEMIR 100 UNIT/ML ~~LOC~~ SOLN
25.0000 [IU] | Freq: Every day | SUBCUTANEOUS | Status: DC
  Administered 2018-07-09: 25 [IU] via SUBCUTANEOUS
  Filled 2018-07-08: qty 0.25

## 2018-07-08 MED ORDER — WARFARIN SODIUM 5 MG PO TABS
5.0000 mg | ORAL_TABLET | Freq: Once | ORAL | Status: AC
  Administered 2018-07-08: 5 mg via ORAL
  Filled 2018-07-08: qty 1

## 2018-07-08 NOTE — Progress Notes (Signed)
PROGRESS NOTE    Madison [name]  VEL:[id number] DOB: Aug 30, 1961 DOA: 07/05/2018 PCP: Renford Dills, MD    Brief Narrative:  57 y.o. female with medical history significant for aortic valve replacement on Coumadin, depression, hypertension, insulin-dependent diabetes mellitus, and multiple strokes with residual deficits, now presenting from her SNF after suffering a ground-level mechanical fall, hitting the back of her head on the floor.  Patient reports that she was on the toilet at her nursing facility, got up to return to her bed, tripped over an assistive device, and fell to the ground, hitting the back of her head on the floor.  There is no loss of consciousness.  She reports having a headache for the past 2 days, not significantly changed since the fall.  She also reports blurred vision for the past 2 days, again no different after the fall.  She has not appreciated any fever and denies any neck pain or stiffness, denies rhinorrhea or sinus congestion, and denies cough, shortness of breath, chest pain, or palpitations.  ED Course: Upon arrival to the ED, patient is found to be febrile to 38 C, saturating adequately on room air, slightly tachycardic, and with stable blood pressure.  Chest x-ray is negative for edema or consolidation.  Noncontrast head CT is concerning for new subacute infarct of the right occipital lobe with mild edema and mass-effect, but no acute hemorrhage or fracture.  Chemistry panel is notable for sodium of 131 and glucose 326.  CBC features a leukocytosis to 10,700.  Urinalysis is suggestive of infection and sample was sent for culture.  INR is 2.2.  Patient was given morphine and a liter of normal saline in the ED.  Neurology was consulted and recommended a medical admission for further evaluation and management.  Assessment & Plan:   Principal Problem:   Ischemic stroke (HCC) Active Problems:   CAD (coronary artery disease)   Depression   Essential hypertension  S/P AVR (aortic valve replacement)   Sepsis secondary to UTI (HCC)   Insulin dependent diabetes mellitus (HCC)   Septic arthritis of knee, left (HCC)  1. Ischemic CVA  - Presents from SNF after a fall, hitting the back of her head on floor, no LOC, and preceded by 2 days of headache and blurred vision   - Head CT concerning for new subacute infarct involving right occipital lobe with mild edema and mass-effect, no hemorrhage or fracture  - Neurology was consulted  - MRI obtained and reviewed. Findings worrisome for large R PCA territory infarct - Per Neurology, no need for repeat dopplers or MRA head. Neurology recommends repeat 2d echo to r/o intracardiac thrombus. Echo reviewed. No evidence of thrombus noted -Pt seen by PT with recommendation for SNF. Anticipate SNF on discharge  2. Sepsis secondary to ecoli UTI, present on admit - Presents after a fall, reports headache, blurred vision, and malaise  - Found to have fever, tachycardia, and leukocytosis  - UA suggestive of infection and sample was sent for cultures; CXR without focal infiltrate, no URI symptoms, abd exam is benign, no meningismus  - Patient currently on empiric rocephin. Culture pos for ecoli, resistant to ampicillin, cefazolin, bactrim. Will continue rocephin for now until afebrile. Consider transition to omnicef to complete course when no longer febrile -Noted to be febrile overnight. Continue tylenol as needed  3. CAD - no chest pains - Continue statin and Plavix per Neurology recs - BP meds were initially held to allow for permissive HTN  4. Aortic  valve replacement  - No hemorrhage on CT  - Continue warfarin per pharmacy dosing - labs reviewed. HGB stable. No evidence of acute blood loss  5. Insulin-dependent DM  - A1c was 9.8% last year  - Managed at SNF with Levemir 25 units qAM and 10 units qPM, as well as Novolog 12 units TID  -glucose now in the 200 range -currently on  Levemir 20 units AM, 10 units  PM, 10 units meal coverage -Will increase to Levemir 25 units AM and 12 units PM with continued meal coverage  6. Hypertension  - BP remains at goal -currently  not on bp meds   7. Depression  - will continue with Lexapro and trazodone as patient tolerates  8. Chronic anticoagulation -patient is continued on therapeutic coumadin  9. Headache -Little relief with tylenol -improvement noted after one time dose of toradol  DVT prophylaxis: Coumadin per pharmacy dosing Code Status: Full Family Communication: Pt in room, family not at bedside Disposition Plan: SNF,when no longer febrile  Consultants:   Neurology  Procedures:     Antimicrobials: Anti-infectives (From admission, onward)   Start     Dose/Rate Route Frequency Ordered Stop   07/05/18 2200  cefTRIAXone (ROCEPHIN) 1 g in sodium chloride 0.9 % 100 mL IVPB     1 g 200 mL/hr over 30 Minutes Intravenous Every 24 hours 07/05/18 2148        Subjective: No complaints this AM. Apprehensive about blood draw  Objective: Vitals:   07/08/18 0409 07/08/18 0600 07/08/18 0757 07/08/18 1215  BP: (!) 122/58  133/62 108/64  Pulse: 62  68 90  Resp: 18  16 16   Temp:  97.6 F (36.4 C) 98.4 F (36.9 C)   TempSrc:  Oral Oral   SpO2: 96%  100% 95%  Weight:      Height:        Intake/Output Summary (Last 24 hours) at 07/08/2018 1449 Last data filed at 07/08/2018 4403 Gross per 24 hour  Intake 100 ml  Output 1100 ml  Net -1000 ml   Filed Weights   07/06/18 0055  Weight: 79.3 kg    Examination: General exam: Awake, laying in bed, in nad Respiratory system: Normal respiratory effort, no wheezing Cardiovascular system: regular rate, s1, s2 Gastrointestinal system: Soft, nondistended, positive BS Central nervous system: CN2-12 grossly intact, strength intact Extremities: Perfused, no clubbing Skin: Normal skin turgor, no notable skin lesions seen Psychiatry: Mood normal // no visual hallucinations   Data Reviewed:  I have personally reviewed following labs and imaging studies  CBC: Recent Labs  Lab 07/05/18 1911 07/08/18 1010  WBC 10.7* 5.3  NEUTROABS 8.3*  --   HGB 11.9* 10.4*  HCT 37.5 32.8*  MCV 91.9 91.4  PLT 228 205   Basic Metabolic Panel: Recent Labs  Lab 07/05/18 1911 07/07/18 0620  NA 131* 136  K 4.5 4.5  CL 95* 102  CO2 24 25  GLUCOSE 326* 163*  BUN 11 12  CREATININE 1.00 0.99  CALCIUM 9.0 9.1   GFR: Estimated Creatinine Clearance: 62.6 mL/min (by C-G formula based on SCr of 0.99 mg/dL). Liver Function Tests: No results for input(s): AST, ALT, ALKPHOS, BILITOT, PROT, ALBUMIN in the last 168 hours. No results for input(s): LIPASE, AMYLASE in the last 168 hours. No results for input(s): AMMONIA in the last 168 hours. Coagulation Profile: Recent Labs  Lab 07/05/18 1911 07/06/18 0329 07/07/18 0620 07/08/18 1010  INR 2.20 2.26 3.35 2.86   Cardiac Enzymes:  No results for input(s): CKTOTAL, CKMB, CKMBINDEX, TROPONINI in the last 168 hours. BNP (last 3 results) No results for input(s): PROBNP in the last 8760 hours. HbA1C: Recent Labs    07/06/18 0329  HGBA1C 8.5*   CBG: Recent Labs  Lab 07/07/18 1859 07/07/18 2121 07/08/18 0228 07/08/18 0636 07/08/18 1147  GLUCAP 268* 333* 212* 261* 222*   Lipid Profile: Recent Labs    07/06/18 0329  CHOL 131  HDL 55  LDLCALC 58  TRIG 92  CHOLHDL 2.4   Thyroid Function Tests: No results for input(s): TSH, T4TOTAL, FREET4, T3FREE, THYROIDAB in the last 72 hours. Anemia Panel: No results for input(s): VITAMINB12, FOLATE, FERRITIN, TIBC, IRON, RETICCTPCT in the last 72 hours. Sepsis Labs: Recent Labs  Lab 07/05/18 2205 07/06/18 0128  LATICACIDVEN 1.3 2.8*    Recent Results (from the past 240 hour(s))  Urine culture     Status: Abnormal   Collection Time: 07/05/18  7:20 PM  Result Value Ref Range Status   Specimen Description URINE, RANDOM  Final   Special Requests   Final    NONE Performed at [facility], [address] [place], [place] [address]    Culture >=100,000 COLONIES/mL ESCHERICHIA COLI (A)  Final   Report Status 07/08/2018 FINAL  Final   Organism ID, Bacteria ESCHERICHIA COLI (A)  Final      Susceptibility   Escherichia coli - MIC*    AMPICILLIN >=32 RESISTANT Resistant     CEFAZOLIN >=64 RESISTANT Resistant     CEFTRIAXONE <=1 SENSITIVE Sensitive     CIPROFLOXACIN <=0.25 SENSITIVE Sensitive     GENTAMICIN <=1 SENSITIVE Sensitive     IMIPENEM <=0.25 SENSITIVE Sensitive     NITROFURANTOIN 32 SENSITIVE Sensitive     TRIMETH/SULFA >=320 RESISTANT Resistant     AMPICILLIN/SULBACTAM >=32 RESISTANT Resistant     PIP/TAZO 64 INTERMEDIATE Intermediate     Extended ESBL NEGATIVE Sensitive     * >=100,000 COLONIES/mL ESCHERICHIA COLI  Culture, blood (x 2)     Status: None (Preliminary result)   Collection Time: 07/05/18 10:06 PM  Result Value Ref Range Status   Specimen Description BLOOD RIGHT FOREARM  Final   Special Requests   Final    BOTTLES DRAWN AEROBIC AND ANAEROBIC Blood Culture results may not be optimal due to an inadequate volume of blood received in culture bottles   Culture   Final    NO GROWTH 1 DAY Performed at [facility], [address], [place], [place] [address]    Report Status PENDING  Incomplete  Culture, blood (x 2)     Status: None (Preliminary result)   Collection Time: 07/05/18 10:27 PM  Result Value Ref Range Status   Specimen Description BLOOD RIGHT HAND  Final   Special Requests   Final    BOTTLES DRAWN AEROBIC ONLY Blood Culture results may not be optimal due to an inadequate volume of blood received in culture bottles   Culture   Final    NO GROWTH 1 DAY Performed at [facility], [address], [place], [place] [address]    Report Status PENDING  Incomplete     Radiology Studies: No results found.  Scheduled Meds: . calcitonin (salmon)  1 spray Alternating Nares Daily  . clopidogrel  75 mg Oral Daily  .  darifenacin  7.5 mg Oral Daily  . docusate sodium  100 mg Oral BID  . escitalopram  20 mg Oral Daily  .  gabapentin  400 mg Oral TID  . insulin aspart  0-5 Units Subcutaneous QHS  . insulin aspart  0-9 Units Subcutaneous TID WC  . insulin aspart  10 Units Subcutaneous TID WC  . insulin detemir  20 Units Subcutaneous Daily   And  . insulin detemir  10 Units Subcutaneous QHS  . ketorolac  30 mg Intravenous Once  . Melatonin  6 mg Oral QHS  . pantoprazole  40 mg Oral Q0600  . pravastatin  40 mg Oral q1800  . traZODone  50 mg Oral QHS  . warfarin  5 mg Oral ONCE-1800  . Warfarin - Pharmacist Dosing Inpatient   Does not apply q1800   Continuous Infusions: . cefTRIAXone (ROCEPHIN)  IV Stopped (07/08/18 0200)     LOS: 3 days   Rickey Barbara, MD [facility] Pager [phone]  If 7PM-7AM, please contact night-coverage [email or web address] Password [facility] 07/08/2018, 2:49 PM

## 2018-07-08 NOTE — Progress Notes (Signed)
ANTICOAGULATION CONSULT NOTE - Follow Up Consult  Pharmacy Consult for Warfarin Indication: Hx CVA and mechanical AVR  Allergies  Allergen Reactions  . Tape Other (See Comments)    Can burn skin if left on too long. Reaction:Burning  Can burn skin if left on too long.  . Celebrex [Celecoxib] Rash  . Detrol [Tolterodine] Hives    Patient Measurements: Height: 5\' 3"  (160 cm) Weight: 174 lb 13.2 oz (79.3 kg) IBW/kg (Calculated) : 52.4  Vital Signs: Temp: 98.4 F (36.9 C) (08/20 0757) Temp Source: Oral (08/20 0757) BP: 133/62 (08/20 0757) Pulse Rate: 68 (08/20 0757)  Labs: Recent Labs    07/05/18 1911 07/06/18 0329 07/07/18 0620 07/08/18 1010  HGB 11.9*  --   --  10.4*  HCT 37.5  --   --  32.8*  PLT 228  --   --  205  LABPROT 24.3* 24.7* 33.7* 29.7*  INR 2.20 2.26 3.35 2.86  CREATININE 1.00  --  0.99  --     Estimated Creatinine Clearance: 62.6 mL/min (by C-G formula based on SCr of 0.99 mg/dL).   Assessment: 57 y/o F on warfarin PTA for hx CVA and mechanical AVR here from a nursing facility s/p mechanical fall. CT with no bleeding but subacute infarct noted. Hgb 10.4. INR is now within goal range at 2.86.  Home regimen: 5mg  most days and 3 mg on Thurs. No missed doses reported.   Goal of Therapy:  INR 2.5 - 3.5 Monitor platelets by anticoagulation protocol: Yes   Plan:  Warfarin 5mg  x 1.  Daily PT/INR Monitor for bleeding with subacute infarct noted.    Burleigh Brockmann A. 58, PharmD, BCPS Clinical Pharmacist [place] Pager: [phone] Please utilize Amion for appropriate phone number to reach the unit pharmacist [facility])   07/08/2018,11:49 AM

## 2018-07-08 NOTE — Care Management Important Message (Signed)
Important Message  Patient Details  Name: Madison [name] MRN: [id number] Date of Birth: 1961-07-24   Medicare Important Message Given:  Yes    Danne Vasek 07/08/2018, 4:13 PM

## 2018-07-08 NOTE — Progress Notes (Signed)
Physical Therapy Treatment Patient Details Name: Madison [name] MRN: [id number] DOB: [birth year] Today's Date: 07/08/2018    History of Present Illness 57 y.o. female with medical history significant for aortic valve replacement on Coumadin, depression, hypertension, insulin-dependent diabetes mellitus, and multiple strokes with residual deficits, admitted from her SNF after suffering a ground-level mechanical fall 2 days PTA during which she hit the back of her head on the floor.  Patient reports that she was on the toilet at her nursing facility, got up to return to her bed, tripped over an assistive device, and fell to the ground, hitting the back of her head on the floor.  MRI large rt PCA CVA with remote bil MCA & PCA infarcts and advanced atrophy from small vessel disease.     PT Comments    Dorothyann doing well today - motivated to work with PT. Requires Mod A for bed mobility with Min A for sit to stand/stand pivot transfers. Patient limited primarily by low vision and L hemi. Able to progress mobility to in room navigation but does require +2 HHA. Making good progress towards goals.     Follow Up Recommendations  SNF     Equipment Recommendations  None recommended by PT    Recommendations for Other Services       Precautions / Restrictions Precautions Precautions: Fall(low vision, L hemi) Restrictions Weight Bearing Restrictions: No    Mobility  Bed Mobility Overal bed mobility: Needs Assistance Bed Mobility: Supine to Sit     Supine to sit: Mod assist     General bed mobility comments: Mod A for LE management and trunk control; uses PT to pull up on, bed pad for hip positioning  Transfers Overall transfer level: Needs assistance Equipment used: 1 person hand held assist Transfers: Sit to/from Stand;Stand Pivot Transfers Sit to Stand: Min assist;Min guard Stand pivot transfers: Min assist;Min guard       General transfer comment: A to power up at bedside and BSC;  low vision and L hemi limiting independence  Ambulation/Gait Ambulation/Gait assistance: Min assist Gait Distance (Feet): 12 Feet Assistive device: 2 person hand held assist Gait Pattern/deviations: Step-to pattern;Decreased stride length;Shuffle Gait velocity: decreased   General Gait Details: HHA+2 due to low vision; able to walk around bed with max directional cueing and [facility]   Stairs             Wheelchair Mobility    Modified Rankin (Stroke Patients Only) Modified Rankin (Stroke Patients Only) Pre-Morbid Rankin Score: Moderately severe disability Modified Rankin: Moderately severe disability     Balance Overall balance assessment: Mild deficits observed, not formally tested                                          Cognition Arousal/Alertness: Awake/alert Behavior During Therapy: Flat affect Overall Cognitive Status: No family/caregiver present to determine baseline cognitive functioning                                        Exercises      General Comments        Pertinent Vitals/Pain Pain Assessment: No/denies pain    Home Living                      Prior Function  PT Goals (current goals can now be found in the care plan section) Acute Rehab PT Goals Patient Stated Goal: return to SNF PT Goal Formulation: With patient Time For Goal Achievement: 07/20/18 Potential to Achieve Goals: Good Progress towards PT goals: Progressing toward goals    Frequency    Min 3X/week      PT Plan Current plan remains appropriate    Co-evaluation              AM-PAC PT "6 Clicks" Daily Activity  Outcome Measure  Difficulty turning over in bed (including adjusting bedclothes, sheets and blankets)?: A Little Difficulty moving from lying on back to sitting on the side of the bed? : Unable Difficulty sitting down on and standing up from a chair with arms (e.g., wheelchair, bedside commode,  etc,.)?: Unable Help needed moving to and from a bed to chair (including a wheelchair)?: A Little Help needed walking in hospital room?: A Little Help needed climbing 3-5 steps with a railing? : A Lot 6 Click Score: 13    End of Session Equipment Utilized During Treatment: Gait belt Activity Tolerance: Patient tolerated treatment well Patient left: in chair;with call bell/phone within reach;with chair alarm set;with family/visitor present Nurse Communication: Mobility status PT Visit Diagnosis: Unsteadiness on feet (R26.81);History of falling (Z91.81);Muscle weakness (generalized) (M62.81);Other abnormalities of gait and mobility (R26.89)     Time: 1007-1219 PT Time Calculation (min) (ACUTE ONLY): 26 min  Charges:  $Gait Training: 8-22 mins $Therapeutic Activity: 8-22 mins                     Kipp Laurence, PT, DPT 07/08/18 1:10 PM Pager: [phone]

## 2018-07-09 LAB — PROTIME-INR
INR: 2.34
Prothrombin Time: 25.4 seconds — ABNORMAL HIGH (ref 11.4–15.2)

## 2018-07-09 LAB — GLUCOSE, CAPILLARY
GLUCOSE-CAPILLARY: 261 mg/dL — AB (ref 70–99)
GLUCOSE-CAPILLARY: 220 mg/dL — AB (ref 70–99)
Glucose-Capillary: 204 mg/dL — ABNORMAL HIGH (ref 70–99)

## 2018-07-09 MED ORDER — NITROFURANTOIN MONOHYD MACRO 100 MG PO CAPS
100.0000 mg | ORAL_CAPSULE | Freq: Two times a day (BID) | ORAL | Status: DC
  Administered 2018-07-09: 100 mg via ORAL
  Filled 2018-07-09 (×2): qty 1

## 2018-07-09 MED ORDER — LISINOPRIL 2.5 MG PO TABS: 2.5000 mg | ORAL_TABLET | ORAL | 0 refills | Status: AC

## 2018-07-09 MED ORDER — NITROFURANTOIN MONOHYD MACRO 100 MG PO CAPS: 100.0000 mg | ORAL_CAPSULE | Freq: Two times a day (BID) | ORAL | 0 refills | Status: AC

## 2018-07-09 MED ORDER — SENNOSIDES-DOCUSATE SODIUM 8.6-50 MG PO TABS: 1.0000 | ORAL_TABLET | Freq: Every day | ORAL | 0 refills | Status: AC

## 2018-07-09 MED ORDER — WARFARIN SODIUM 7.5 MG PO TABS: 7.5000 mg | ORAL_TABLET | Freq: Once | ORAL | Status: DC

## 2018-07-09 MED ORDER — BISACODYL 10 MG RE SUPP
10.0000 mg | Freq: Every day | RECTAL | Status: DC
  Administered 2018-07-09: 10 mg via RECTAL
  Filled 2018-07-09: qty 1

## 2018-07-09 MED ORDER — POLYETHYLENE GLYCOL 3350 17 G PO PACK: 17.0000 g | PACK | Freq: Every day | ORAL | 0 refills | Status: AC

## 2018-07-09 MED ORDER — HYDROCODONE-ACETAMINOPHEN 5-325 MG PO TABS: 1.0000 | ORAL_TABLET | Freq: Three times a day (TID) | ORAL | 0 refills | Status: AC | PRN

## 2018-07-09 NOTE — Clinical Social Work Note (Signed)
Clinical Social Work Assessment  Patient Details  Name: Madison [name] MRN: [id number] Date of Birth: 1961/04/14  Date of referral:  07/09/18               Reason for consult:  Facility Placement                Permission sought to share information with:  Chartered certified accountant granted to share information::  Yes, Verbal Permission Granted  Name::        Agency::  [facility]  Relationship::     Contact Information:     Housing/Transportation Living arrangements for the past 2 months:  [place] of Information:  Patient, Medical Team Patient Interpreter Needed:  None Criminal Activity/Legal Involvement Pertinent to Current Situation/Hospitalization:  No - Comment as needed Significant Relationships:  Siblings Lives with:  Self Do you feel safe going back to the place where you live?  Yes Need for family participation in patient care:  No (Coment)  Care giving concerns:  Patient from [facility] and reports no concerns about care received.   Social Worker assessment / plan:  CSW met with patient, confirmed with patient plan to return to [facility]. CSW to follow to arrange discharge coordination.  Employment status:  Retired Nurse, adult PT Recommendations:  [place] / Referral to community resources:  [place]  Patient/Family's Response to care:  Patient agreeable to return to [employer].  Patient/Family's Understanding of and Emotional Response to Diagnosis, Current Treatment, and Prognosis:  Patient aware that she needs additional care at discharge to return to [facility]. Patient appreciative of care at the hospital and of CSW assistance.  Emotional Assessment Appearance:  Appears stated age Attitude/Demeanor/Rapport:  Engaged Affect (typically observed):  Pleasant Orientation:  Oriented to Self, Oriented to Place, Oriented to  Time, Oriented to  Situation Alcohol / Substance use:  Not Applicable Psych involvement (Current and /or in the community):  No (Comment)  Discharge Needs  Concerns to be addressed:  Care Coordination Readmission within the last 30 days:  No Current discharge risk:  Physical Impairment, Dependent with Mobility Barriers to Discharge:  No Barriers Identified   Geralynn Ochs, LCSW 07/09/2018, 2:52 PM

## 2018-07-09 NOTE — Care Management Note (Signed)
Case Management Note  Patient Details  Name: Madison [name] MRN: [id number] Date of Birth: November 30, 1960  Subjective/Objective:                    Action/Plan: Pt discharging back to [facility] today. CM signing off.   Expected Discharge Date:  07/09/18               Expected Discharge Plan:  Skilled Nursing Facility  In-House Referral:  Clinical Social Work  Discharge planning Services     Post Acute Care Choice:    Choice offered to:     DME Arranged:    DME Agency:     HH Arranged:    HH Agency:     Status of Service:  Completed, signed off  If discussed at [employer], dates discussed:    Additional Comments:  Kermit Balo, RN 07/09/2018, 12:27 PM

## 2018-07-09 NOTE — Progress Notes (Signed)
ANTICOAGULATION CONSULT NOTE - Follow Up Consult  Pharmacy Consult for Warfarin Indication: Hx CVA and mechanical AVR  Patient Measurements: Height: 5\' 3"  (160 cm) Weight: 174 lb 13.2 oz (79.3 kg) IBW/kg (Calculated) : 52.4  Vital Signs: Temp: 98.2 F (36.8 C) (08/21 1029) Temp Source: Oral (08/21 1029) BP: 117/68 (08/21 1029) Pulse Rate: 76 (08/21 1029)  Labs: Recent Labs    07/07/18 0620 07/08/18 1010 07/09/18 0543  HGB  --  10.4*  --   HCT  --  32.8*  --   PLT  --  205  --   LABPROT 33.7* 29.7* 25.4*  INR 3.35 2.86 2.34  CREATININE 0.99  --   --     Estimated Creatinine Clearance: 62.6 mL/min (by C-G formula based on SCr of 0.99 mg/dL).   Assessment: 57 y/o F on warfarin PTA for hx CVA and mechanical AVR here from a nursing facility s/p mechanical fall. CT with no bleeding but subacute infarct noted. Pharmacy consulted to resume warfarin dosing this admission.   Home regimen: 5mg  most days and 3 mg on Thurs.  INR slightly SUBtherapeutic today (INR 2.34 << 2.84, goal of 2-3). Noted dose held on 8/19 evening due to a large jump in INR. No CBC today - no bleeding noted.   Goal of Therapy:  INR 2.5 - 3.5 Monitor platelets by anticoagulation protocol: Yes   Plan:  - Warfarin 7.5 mg x 1 dose at 1800 today - Will continue to monitor for any signs/symptoms of bleeding and will follow up with PT/INR in the a.m.   Thank you for allowing pharmacy to be a part of this patient's care.  , PharmD, BCPS Clinical Pharmacist Pager: [phone] Clinical phone for 07/09/2018 from 7a-3:30p: [phone] If after 3:30p, please call main pharmacy at: [phone] Please check AMION for all [facility] Pharm[facility] numbers 07/09/2018 10:53 AM

## 2018-07-09 NOTE — Progress Notes (Signed)
Discharge to: [facility] Anticipated discharge date: 07/09/18 Transportation by: [facility]  Report #: [id number], Room 704A  CSW signing off.  Blenda Nicely LCSW [phone]

## 2018-07-09 NOTE — Discharge Summary (Signed)
Discharge Summary  EDDA OREA ZOX:[id number] DOB: Feb 22, 1961  PCP: Renford Dills, MD  Admit date: 07/05/2018 Discharge date: 07/09/2018  Time spent: , more than 50% time spent on coordination of care.  Recommendations for Outpatient Follow-up:  1. F/u with SNF MD within a week  for hospital discharge follow up, repeat cbc/bmp at follow up. SNF MD to monitor INR and coumadin 2. F/u with neurology in 4 weeks for stroke follow up 3. F/u with cardiology Dr Eden Emms as already scheduled 4. F/u with vascular surgery Dr Chestine Spore for PAD as already scheduled  Discharge Diagnoses:  Active Hospital Problems   Diagnosis Date Noted  . Ischemic stroke (HCC)   . Septic arthritis of knee, left (HCC) 07/05/2018  . Insulin dependent diabetes mellitus (HCC) 03/07/2017  . Sepsis secondary to UTI (HCC)   . S/P AVR (aortic valve replacement)   . Essential hypertension   . Depression 04/29/2014  . CAD (coronary artery disease)     Resolved Hospital Problems  No resolved problems to display.    Discharge Condition: stable  Diet recommendation: heart healthy/carb modified  Filed Weights   07/06/18 0055  Weight: 79.3 kg    History of present illness: (per admitting MD Dr Antionette Char)  PCP: Renford Dills, MD   Patient coming from: SNF   Chief Complaint: Fall, hit back of head   HPI: Madison [name] is a 57 y.o. female with medical history significant for aortic valve replacement on Coumadin, depression, hypertension, insulin-dependent diabetes mellitus, and multiple strokes with residual deficits, now presenting from her SNF after suffering a ground-level mechanical fall, hitting the back of her head on the floor.  Patient reports that she was on the toilet at her nursing facility, got up to return to her bed, tripped over an assistive device, and fell to the ground, hitting the back of her head on the floor.  There is no loss of consciousness.  She reports having a headache for the past 2  days, not significantly changed since the fall.  She also reports blurred vision for the past 2 days, again no different after the fall.  She has not appreciated any fever and denies any neck pain or stiffness, denies rhinorrhea or sinus congestion, and denies cough, shortness of breath, chest pain, or palpitations.  ED Course: Upon arrival to the ED, patient is found to be febrile to 38 C, saturating adequately on room air, slightly tachycardic, and with stable blood pressure.  Chest x-ray is negative for edema or consolidation.  Noncontrast head CT is concerning for new subacute infarct of the right occipital lobe with mild edema and mass-effect, but no acute hemorrhage or fracture.  Chemistry panel is notable for sodium of 131 and glucose 326.  CBC features a leukocytosis to 10,700.  Urinalysis is suggestive of infection and sample was sent for culture.  INR is 2.2.  Patient was given morphine and a liter of normal saline in the ED.  Neurology was consulted and recommended a medical admission for further evaluation and management. Hospital Course:  Principal Problem:   Ischemic stroke [facility]) Active Problems:   CAD (coronary artery disease)   Depression   Essential hypertension   S/P AVR (aortic valve replacement)   Sepsis secondary to UTI (HCC)   Insulin dependent diabetes mellitus (HCC)   Septic arthritis of knee, left (HCC)   Ischemic CVAwith vision impairment and headache -Presents from SNF after a fall, hitting the back of her head on floor, no LOC, and  preceded by 2 days of headache and blurred vision -Head CT concerning for new subacute infarct involving right occipital lobe with mild edema and mass-effect, no hemorrhage or fracture -MRI obtained and reviewed. Findings worrisome for large R PCA territory infarct - Neurology was consulted, Per Neurology, no need for repeat dopplers or MRA head. Neurology recommends repeat 2d echo to r/o intracardiac thrombus. Echo reviewed. No  evidence of thrombus noted -coumadin resumed , INR therapeutic, per chart review, patient underwent angioplasty to right lower leg on 7/18, she has subtherapeutic INR at that time.  -Pt seen by PT with recommendation for SNF. 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 urine Culture pos for ecoli, resistant to ampicillin, cefazolin, bactrim.  -transition to nitrofurantoin for two more days to finish total of 7 days abx treatment for complicated uti. -fever and leukocytosis resolved at discharge.  CAD -no chest pains -Continue statin and Plavixper Neurology recs -BP meds were initially held to allow for permissive HTN  Aortic valve replacementon coumadin -No hemorrhage on CT -Continue warfarinper pharmacy dosing - labs reviewed. HGB stable. No evidence of acute blood loss  PAD S/p right lower leg angioplasty on 06/05/2018, on statin and plavix She is to follow up with vascular surgery Dr Chestine Spore  Insulin-dependent DM -A1c was 9.8% last year, repeat a1c 8.5 this time -she is continued on home regimen insulin and follow up with snf MD for blood glucose monitoring.   Hypertension -BP low normal, bp meds held in the hospital - -gradually restart home meds lisinopril with holding parameters    Depression -will continue with  Lexapro and trazodoneas patient tolerates   DVT prophylaxis: Coumadin  Code Status: Full Family Communication: Pt in room, Disposition Plan: SNF  Consultants:   Neurology   Procedures:  none    Discharge Exam: BP 98/61 (BP Location: Right Arm)   Pulse 76   Temp 97.9 F (36.6 C) (Oral)   Resp 18   Ht 5\' 3"  (1.6 m)   Wt 79.3 kg   SpO2 99%   BMI 30.97 kg/m   General: NAD, impaired vision, chronic left upper extremity contracture  Cardiovascular: RRR Respiratory: CTABL  Discharge Instructions You were cared for by a hospitalist during your hospital stay. If you have any questions about your discharge medications or the care you received while you were in the hospital after you are discharged, you can call the unit and asked to speak with the hospitalist on call if the hospitalist that took care of you is not available. Once you are discharged, your primary care physician will handle any further medical issues. Please note that NO REFILLS for any discharge medications will be authorized once you are discharged, as it is imperative that you return to your primary care physician (or establish a relationship with a primary care physician if you do not have one) for your aftercare needs so that they can  reassess your need for medications and monitor your lab values.  Discharge Instructions    Ambulatory referral to Neurology   Complete by:  As directed    An appointment is requested in approximately: 4 weeks  For CVA   Diet - low sodium heart healthy   Complete by:  As directed    Carb modified diet, patient needs to be fed She has visual impairment, she has left arm contraction   Increase activity slowly   Complete by:  As directed      Allergies as of 07/09/2018      Reactions   Tape Other (See Comments)   Can burn skin if left on too long. Reaction:Burning  Can burn skin if left on too long.   Celebrex [celecoxib] Rash   Detrol [tolterodine] Hives        Medication List    STOP taking these medications   benzonatate 100 MG capsule Commonly known as:  TESSALON   docusate sodium 100 MG capsule Commonly known as:  COLACE   guaiFENesin-dextromethorphan 100-10 MG/5ML syrup Commonly known as:  ROBITUSSIN DM   ondansetron 4 MG tablet Commonly known as:  ZOFRAN   prazosin 2 MG capsule Commonly known as:  MINIPRESS   traMADol 50 MG tablet Commonly known as:  ULTRAM     TAKE these medications   acetaminophen 325 MG tablet Commonly known as:  TYLENOL Take 2 tablets (650 mg total) by mouth every 6 (six) hours as needed for mild pain (or Fever >/= 101).   calcitonin (salmon) 200 UNIT/ACT nasal spray Commonly known as:  MIACALCIN/FORTICAL Place 1 spray into alternate nostrils daily. (0900)   clopidogrel 75 MG tablet Commonly known as:  PLAVIX Take 1 tablet (75 mg total) by mouth daily.   ergocalciferol 50000 units capsule Commonly known as:  VITAMIN D2 Take 50,000 Units by mouth every 30 (thirty) days.   escitalopram 20 MG tablet Commonly known as:  LEXAPRO Take 20 mg by mouth daily. (0900)   ferrous sulfate 325 (65 FE) MG tablet Take 325 mg by mouth daily with breakfast. (0800)   gabapentin 400 MG capsule Commonly known as:  NEURONTIN Take 400 mg by mouth 3 (three) times daily. (0900, 1300, 1700)   GLUCAGON EMERGENCY 1 MG injection Generic drug:  glucagon Inject 1 mg into the muscle every 15 (fifteen) minutes as needed (for hypoglycemia for CBG<50).   HYDROcodone-acetaminophen 5-325 MG tablet Commonly known as:  NORCO/VICODIN Take 1 tablet by mouth 3 (three) times daily as needed (for pain.).   ibandronate 150 MG tablet Commonly known as:  BONIVA Take 150 mg by mouth every 30 (thirty) days. Take in the morning with a full glass of water, on an empty stomach, and do not take anything else by mouth or lie down for the next 30 min.   insulin aspart 100 UNIT/ML injection Commonly known as:  novoLOG Inject 13 Units  into the skin 3 (three) times daily before meals. What changed:    how much to take  additional instructions   insulin detemir 100 UNIT/ML injection Commonly known as:  LEVEMIR Inject 0.2 mLs (20 Units total) into the skin at bedtime. What changed:    how much to take  when to take this  additional instructions   lisinopril 2.5 MG tablet Commonly known as:  PRINIVIL,ZESTRIL Take 1 tablet (2.5 mg total) by mouth every Monday, Wednesday, and Friday. (0900) Hold if sbp less than 90 What changed:    when to take  this  additional instructions   loratadine 10 MG tablet Commonly known as:  CLARITIN Take 10 mg by mouth daily. (0900)   Melatonin 3 MG Tabs Take 6 mg by mouth at bedtime. (2100)   nitrofurantoin (macrocrystal-monohydrate) 100 MG capsule Commonly known as:  MACROBID Take 1 capsule (100 mg total) by mouth every 12 (twelve) hours for 2 days.   pantoprazole 40 MG tablet Commonly known as:  PROTONIX Take 40 mg by mouth daily at 6 (six) AM. (0630)   polyethylene glycol packet Commonly known as:  MIRALAX / GLYCOLAX Take 17 g by mouth daily. What changed:    when to take this  reasons to take this   pravastatin 40 MG tablet Commonly known as:  PRAVACHOL Take 40 mg by mouth daily at 6 PM. (1700)   senna-docusate 8.6-50 MG tablet Commonly known as:  Senokot-S Take 1 tablet by mouth at bedtime.   solifenacin 5 MG tablet Commonly known as:  VESICARE Take 5 mg by mouth daily. (0900)   traZODone 50 MG tablet Commonly known as:  DESYREL Take 50 mg by mouth at bedtime. (2000)   vitamin C 500 MG tablet Commonly known as:  ASCORBIC ACID Take 500 mg by mouth daily. (0900)   warfarin 5 MG tablet Commonly known as:  COUMADIN Take 3-5 mg by mouth See admin instructions. TAKE 5 MG BY MOUTH ON MONDAYS, TUESDAYS, WEDNESDAYS, FRIDAYS, SATURDAYS, & SUNDAYS AT 1700 TAKE 3 MG BY MOUTH ON THURSDAYS AT 1700      Allergies  Allergen Reactions  . Tape Other (See  Comments)    Can burn skin if left on too long. Reaction:Burning  Can burn skin if left on too long.  . Celebrex [Celecoxib] Rash  . Detrol [Tolterodine] Hives   Follow-up Information    Renford Dills, MD Follow up.   Specialty:  Internal Medicine Contact information: [address] [place] [address] [phone]            The results of significant diagnostics from this hospitalization (including imaging, microbiology, ancillary and laboratory) are listed below for reference.    Significant Diagnostic Studies: Dg Chest 2 View  Result Date: 07/05/2018 CLINICAL DATA:  Pain following fall EXAM: CHEST - 2 VIEW COMPARISON:  November 29, 2016 FINDINGS: There is no edema or consolidation. Heart size and pulmonary vascularity are normal. No adenopathy. Patient is status post aortic valve replacement. No adenopathy. No bone lesions. No pneumothorax. IMPRESSION: Status post aortic valve replacement. No edema or consolidation. Heart size normal. Electronically Signed   By: Bretta Bang III M.D.   On: 07/05/2018 18:21   Ct Head Wo Contrast  Result Date: 07/05/2018 CLINICAL DATA:  57 y/o  F; fall today.  Headache and dizziness. EXAM: CT HEAD WITHOUT CONTRAST TECHNIQUE: Contiguous axial images were obtained from the base of the skull through the vertex without intravenous contrast. COMPARISON:  04/30/2016 CT head FINDINGS: Brain: There is a region of hypoattenuation with loss of gray-white differentiation and mild local mass effect of the right occipital lobe which is new from the prior CT of head and compatible with subacute infarction. There multiple small cortical infarctions within the bilateral frontal lobes, parietal lobes, except a lobes as well as a large chronic infarction in the right posterior MCA distribution. There are very small chronic infarctions in bilateral cerebellar hemispheres. There are advanced chronic microvascular ischemic changes of white matter  and volume loss of the brain. No hemorrhage, extra-axial collection, hydrocephalus, or  herniation. Vascular: Calcific atherosclerosis of the carotid siphons and vertebral arteries. No hyperdense vessel identified. Skull: Normal. Negative for fracture or focal lesion. Sinuses/Orbits: Partial left mastoid opacification. Normal aeration of the mastoid air cells with a small right maxillary sinus fluid level. Orbits are unremarkable. Other: None. IMPRESSION: 1. New subacute infarction of the right occipital lobe with mild edema and mass effect. 2. No acute hemorrhage or calvarial fracture. 3. Numerous chronic infarcts throughout the supratentorial and infratentorial brain including a large chronic right posterior MCA infarct. 4. Partial left mastoid opacification. Small right maxillary fluid level may represent acute sinusitis. Electronically Signed   By: Mitzi Hansen M.D.   On: 07/05/2018 18:39   Mr Brain Wo Contrast  Result Date: 07/06/2018 CLINICAL DATA:  Initial evaluation for acute stroke. EXAM: MRI HEAD WITHOUT CONTRAST TECHNIQUE: Multiplanar, multiecho pulse sequences of the brain and surrounding structures were obtained without intravenous contrast. COMPARISON:  Prior CT from 07/05/2018. FINDINGS: Brain: Diffuse prominence of the CSF containing spaces compatible with generalized age-related cerebral atrophy. Patchy and confluent T2/FLAIR hyperintensity within the periventricular and deep white matter both cerebral hemispheres consistent with chronic small vessel ischemic change, advanced in nature. Extensive encephalomalacia throughout the right cerebral hemisphere consistent with large remote right MCA territory infarct. Additional scattered multifocal left MCA territory infarcts involving the left frontal and parietal lobes. Remote left PCA territory infarct involving the left occipital lobe. Scatter remote bilateral cerebellar infarcts. Small remote lacunar infarcts present within the  bilateral thalami. Large confluent area of restricted diffusion involving the parasagittal right parieto-occipital region consistent with acute to early subacute right PCA territory infarct (series 5, image 69). Patchy involvement of the right thalamus. Associated petechial hemorrhage without frank hemorrhagic transformation (series 12, image 23). Associated localized gyral swelling and edema without significant regional mass effect. No other evidence for acute or subacute infarct. No mass lesion, midline shift or mass effect. Mild ex vacuo dilatation of the left lateral ventricle related to chronic left PCA territory infarct without hydrocephalus. No extra-axial fluid collection. Pituitary gland normal. Vascular: Abnormal flow void within the right ICA to the level of the terminus, likely occluded. Major intracranial vascular flow voids otherwise maintained. Skull and upper cervical spine: Craniocervical junction normal. Upper cervical spine within normal limits. Bone marrow signal intensity normal. No scalp soft tissue abnormality. Sinuses/Orbits: Globes and orbital soft tissues within normal limits. Mild mucosal thickening with small air-fluid level within the right maxillary sinus, suggesting possible acute sinusitis. Left mastoid effusion noted. Trace opacity right mastoid air cells. Other: None. IMPRESSION: 1. Large acute to early subacute right PCA territory infarct. Associated petechial hemorrhage without frank hemorrhagic transformation. No significant regional mass effect. 2. Extensive chronic ischemic changes with multiple remote bilateral MCA and PCA territory infarcts, with additional remote bilateral cerebellar infarcts. 3. Abnormal flow void within the right ICA to the terminus, likely occluded. 4. Advanced atrophy with chronic small vessel ischemic disease. Electronically Signed   By: Rise Mu M.D.   On: 07/06/2018 00:48    Microbiology: Recent Results (from the past 240 hour(s))    Urine culture     Status: Abnormal   Collection Time: 07/05/18  7:20 PM  Result Value Ref Range Status   Specimen Description URINE, RANDOM  Final   Special Requests   Final    NONE Performed at [facility], [address], [place], [place] [address]    Culture >=100,000 COLONIES/mL ESCHERICHIA COLI (A)  Final   Report Status 07/08/2018 FINAL  Final   Organism ID, Bacteria ESCHERICHIA COLI (A)  Final      Susceptibility   Escherichia coli - MIC*    AMPICILLIN >=32 RESISTANT Resistant     CEFAZOLIN >=64 RESISTANT Resistant     CEFTRIAXONE <=1 SENSITIVE Sensitive     CIPROFLOXACIN <=0.25 SENSITIVE Sensitive     GENTAMICIN <=1 SENSITIVE Sensitive     IMIPENEM <=0.25 SENSITIVE Sensitive     NITROFURANTOIN 32 SENSITIVE Sensitive     TRIMETH/SULFA >=320 RESISTANT Resistant     AMPICILLIN/SULBACTAM >=32 RESISTANT Resistant     PIP/TAZO 64 INTERMEDIATE Intermediate     Extended ESBL NEGATIVE Sensitive     * >=100,000 COLONIES/mL ESCHERICHIA COLI  Culture, blood (x 2)     Status: None (Preliminary result)   Collection Time: 07/05/18 10:06 PM  Result Value Ref Range Status   Specimen Description BLOOD RIGHT FOREARM  Final   Special Requests   Final    BOTTLES DRAWN AEROBIC AND ANAEROBIC Blood Culture results may not be optimal due to an inadequate volume of blood received in culture bottles   Culture   Final    NO GROWTH 3 DAYS Performed at [facility], [address] [place], [place] [address]    Report Status PENDING  Incomplete  Culture, blood (x 2)     Status: None (Preliminary result)   Collection Time: 07/05/18 10:27 PM  Result Value Ref Range Status   Specimen Description BLOOD RIGHT HAND  Final   Special Requests   Final    BOTTLES DRAWN AEROBIC ONLY Blood Culture results may not be optimal due to an inadequate volume of blood received in culture bottles   Culture   Final    NO GROWTH 3 DAYS Performed at [facility], [address] [place],  [place] [address]    Report Status PENDING  Incomplete     Labs: Basic Metabolic Panel: Recent Labs  Lab 07/05/18 1911 07/07/18 0620  NA 131* 136  K 4.5 4.5  CL 95* 102  CO2 24 25  GLUCOSE 326* 163*  BUN 11 12  CREATININE 1.00 0.99  CALCIUM 9.0 9.1   Liver Function Tests: No results for input(s): AST, ALT, ALKPHOS, BILITOT, PROT, ALBUMIN in the last 168 hours. No results for input(s): LIPASE, AMYLASE in the last 168 hours. No results for input(s): AMMONIA in the last 168 hours. CBC: Recent Labs  Lab 07/05/18 1911 07/08/18 1010  WBC 10.7* 5.3  NEUTROABS 8.3*  --   HGB 11.9* 10.4*  HCT 37.5 32.8*  MCV 91.9 91.4  PLT 228 205   Cardiac Enzymes: No results for input(s): CKTOTAL, CKMB, CKMBINDEX, TROPONINI in the last 168 hours. BNP: BNP (last 3 results) No results for input(s): BNP in the last 8760 hours.  ProBNP (last 3 results) No results for input(s): PROBNP in the last 8760 hours.  CBG: Recent Labs  Lab 07/08/18 1643 07/08/18 2056 07/09/18 0433 07/09/18 0645 07/09/18 1205  GLUCAP 82 93 261* 220* 204*       Signed:  Albertine Grates MD, PhD  [facility] 07/09/2018, 1:32 PM

## 2018-07-09 NOTE — Progress Notes (Signed)
Patient discharged to [name] via [facility] on stretcher accompanied by two attendants.  Report called to receiving RN and questions answered to his satisfaction.  Patient aware of her discharge and had no questions for this nurse.  In stable condition at time of discharge.

## 2018-07-11 LAB — CULTURE, BLOOD (ROUTINE X 2)
CULTURE: NO GROWTH
CULTURE: NO GROWTH

## 2018-07-17 ENCOUNTER — Emergency Department (HOSPITAL_COMMUNITY)
Admission: EM | Admit: 2018-07-17 | Discharge: 2018-07-17 | Disposition: A | Discharge status: Home / Self Care | Payer: Medicare Other | Source: Home / Self Care | Attending: Emergency Medicine | Admitting: Emergency Medicine

## 2018-07-17 ENCOUNTER — Emergency Department (HOSPITAL_COMMUNITY): Payer: Medicare Other

## 2018-07-17 ENCOUNTER — Encounter (HOSPITAL_COMMUNITY): Payer: Self-pay

## 2018-07-17 ENCOUNTER — Other Ambulatory Visit: Payer: Self-pay

## 2018-07-17 DIAGNOSIS — I2510 Atherosclerotic heart disease of native coronary artery without angina pectoris: Secondary | ICD-10-CM

## 2018-07-17 DIAGNOSIS — Z7901 Long term (current) use of anticoagulants: Secondary | ICD-10-CM | POA: Insufficient documentation

## 2018-07-17 DIAGNOSIS — R109 Unspecified abdominal pain: Secondary | ICD-10-CM | POA: Insufficient documentation

## 2018-07-17 DIAGNOSIS — Z794 Long term (current) use of insulin: Secondary | ICD-10-CM

## 2018-07-17 DIAGNOSIS — I110 Hypertensive heart disease with heart failure: Secondary | ICD-10-CM | POA: Insufficient documentation

## 2018-07-17 DIAGNOSIS — R112 Nausea with vomiting, unspecified: Secondary | ICD-10-CM

## 2018-07-17 DIAGNOSIS — Z79899 Other long term (current) drug therapy: Secondary | ICD-10-CM | POA: Insufficient documentation

## 2018-07-17 DIAGNOSIS — R1110 Vomiting, unspecified: Secondary | ICD-10-CM

## 2018-07-17 DIAGNOSIS — I5042 Chronic combined systolic (congestive) and diastolic (congestive) heart failure: Secondary | ICD-10-CM | POA: Insufficient documentation

## 2018-07-17 DIAGNOSIS — E119 Type 2 diabetes mellitus without complications: Secondary | ICD-10-CM | POA: Insufficient documentation

## 2018-07-17 DIAGNOSIS — A419 Sepsis, unspecified organism: Secondary | ICD-10-CM | POA: Diagnosis not present

## 2018-07-17 LAB — URINALYSIS, ROUTINE W REFLEX MICROSCOPIC
BACTERIA UA: NONE SEEN
Bilirubin Urine: NEGATIVE
HGB URINE DIPSTICK: NEGATIVE
Ketones, ur: 80 mg/dL — AB
NITRITE: NEGATIVE
PROTEIN: NEGATIVE mg/dL
SPECIFIC GRAVITY, URINE: 1.018 (ref 1.005–1.030)
pH: 5 (ref 5.0–8.0)

## 2018-07-17 LAB — CBC
HEMATOCRIT: 38.2 % (ref 36.0–46.0)
HEMOGLOBIN: 12.9 g/dL (ref 12.0–15.0)
MCH: 29.7 pg (ref 26.0–34.0)
MCHC: 33.8 g/dL (ref 30.0–36.0)
MCV: 87.8 fL (ref 78.0–100.0)
Platelets: 499 10*3/uL — ABNORMAL HIGH (ref 150–400)
RBC: 4.35 MIL/uL (ref 3.87–5.11)
RDW: 13.7 % (ref 11.5–15.5)
WBC: 18.1 10*3/uL — ABNORMAL HIGH (ref 4.0–10.5)

## 2018-07-17 LAB — COMPREHENSIVE METABOLIC PANEL
ALBUMIN: 3.6 g/dL (ref 3.5–5.0)
ALK PHOS: 108 U/L (ref 38–126)
ALT: 15 U/L (ref 0–44)
ANION GAP: 22 — AB (ref 5–15)
AST: 18 U/L (ref 15–41)
BILIRUBIN TOTAL: 1.2 mg/dL (ref 0.3–1.2)
BUN: 27 mg/dL — ABNORMAL HIGH (ref 6–20)
CALCIUM: 9.5 mg/dL (ref 8.9–10.3)
CO2: 20 mmol/L — ABNORMAL LOW (ref 22–32)
Chloride: 95 mmol/L — ABNORMAL LOW (ref 98–111)
Creatinine, Ser: 1.54 mg/dL — ABNORMAL HIGH (ref 0.44–1.00)
GFR, EST AFRICAN AMERICAN: 42 mL/min — AB (ref >60)
GFR, EST NON AFRICAN AMERICAN: 36 mL/min — AB (ref >60)
Glucose, Bld: 360 mg/dL — ABNORMAL HIGH (ref 70–99)
POTASSIUM: 4.7 mmol/L (ref 3.5–5.1)
Sodium: 137 mmol/L (ref 135–145)
TOTAL PROTEIN: 7.5 g/dL (ref 6.5–8.1)

## 2018-07-17 LAB — LIPASE, BLOOD: LIPASE: 18 U/L (ref 11–51)

## 2018-07-17 MED ORDER — ONDANSETRON 8 MG PO TBDP: 8.0000 mg | ORAL_TABLET | Freq: Three times a day (TID) | ORAL | 0 refills | Status: AC | PRN

## 2018-07-17 MED ORDER — IOPAMIDOL (ISOVUE-300) INJECTION 61%
100.0000 mL | Freq: Once | INTRAVENOUS | Status: AC | PRN
  Administered 2018-07-17: 80 mL via INTRAVENOUS

## 2018-07-17 MED ORDER — SODIUM CHLORIDE 0.9 % IV BOLUS
1000.0000 mL | Freq: Once | INTRAVENOUS | Status: AC
  Administered 2018-07-17: 1000 mL via INTRAVENOUS

## 2018-07-17 MED ORDER — ONDANSETRON HCL 4 MG/2ML IJ SOLN
4.0000 mg | Freq: Once | INTRAMUSCULAR | Status: AC
  Administered 2018-07-17: 4 mg via INTRAVENOUS
  Filled 2018-07-17: qty 2

## 2018-07-17 MED ORDER — IOPAMIDOL (ISOVUE-300) INJECTION 61%
INTRAVENOUS | Status: AC
  Filled 2018-07-17: qty 100

## 2018-07-17 NOTE — ED Notes (Signed)
Bed: VH84 Expected date:  Expected time:  Means of arrival:  Comments: EMS "dark" emesis

## 2018-07-17 NOTE — ED Notes (Signed)
Patient transported to X-ray 

## 2018-07-17 NOTE — ED Triage Notes (Signed)
Per EMS: Pt from [facility].  Pt c/o of N/V and abdominal pain since last night.  Facility staff stated emeses was dark the whole time.  Pt c/o of H/A.  Pt hx of 2 CVA.  L-sided weakness and vision problems are normal for her.

## 2018-07-17 NOTE — ED Provider Notes (Signed)
[facility]-EMERGENCY DEPT Provider Note   CSN: [id number] Arrival date & time: 07/17/18  1614     History   Chief Complaint Chief Complaint  Patient presents with  . Emesis    HPI Madison [name] is a 57 y.o. female.  HPI 57 year old female presents the emergency department complaints of nausea and vomiting and upper abdominal discomfort since last night.  No frank blood.  No fevers or chills.  Reports no lower abdominal discomfort.  No urinary complaints.  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  Currently resides at a nursing facility.  Symptoms are mild in severity.   Past Medical History:  Diagnosis Date  . Allergy   . Anxiety   . Aortic stenosis   . CAD (coronary artery disease)   . CHF (congestive heart failure) (HCC)   . Depression   . Diabetes (HCC) dx'd 1982   "went straight to insulin"  . Dyslipidemia   . GERD (gastroesophageal reflux disease)   . Heart murmur   . HTN (hypertension)   . Hypercholesteremia   . Myocardial infarction (HCC) 12/2014  . Obesity   . Pneumonia 11/2014; 12/2014  . Rheumatoid arthritis(714.0)    "hands" (07/01/2015)  . Stroke syndrome 1995; 2001   "when my son was born; problems w/speech and L hand since then" (01/19/2015)  . Thrombophlebitis     Patient Active Problem List   Diagnosis Date Noted  . Septic arthritis of knee, left (HCC) 07/05/2018  . Atherosclerosis of artery of extremity with ulceration (HCC) 04/28/2018  . Insulin dependent diabetes mellitus (HCC) 03/07/2017  . Closed displaced intertrochanteric fracture of left femur (HCC) 11/29/2016  . Difficult airway for intubation 11/29/2016  . SIRS (systemic inflammatory response syndrome) (HCC) 05/25/2016  . MDD (major depressive disorder), recurrent episode, moderate (HCC)   . Aspiration pneumonia (HCC) 05/17/2016  . Diabetic ketoacidosis without coma (HCC)   . Sepsis secondary to UTI (HCC)   . Acute hypoxemic respiratory failure (HCC)   .  Chronic combined systolic and diastolic congestive heart failure (HCC)   . Hypoglycemia   . Chronic diastolic heart failure (HCC)   . Esophageal reflux   . S/P AVR (aortic valve replacement)   . Constipation 11/23/2015  . Pressure ulcer 08/02/2015  . Diabetic ketoacidosis with coma associated with other specified diabetes mellitus (HCC)   . Closed left ankle fracture 07/01/2015  . Trimalleolar fracture of left ankle 07/01/2015  . Essential hypertension   . Diabetes type 1, uncontrolled (HCC)   . S/P aortic valve replacement   . Blood poisoning   . Spastic hemiplegia affecting nondominant side (HCC) 11/29/2014  . Dysphagia, pharyngoesophageal phase 10/04/2014  . Depression 04/29/2014  . CVA (cerebral infarction) 03/12/2014  . Chronic anticoagulation 07/28/2013  . CAD (coronary artery disease)   . Ischemic stroke (HCC)   . Hyperlipidemia   . Rheumatoid arthritis [facility])     Past Surgical History:  Procedure Laterality Date  . AORTIC VALVE REPLACEMENT (AVR)/CORONARY ARTERY BYPASS GRAFTING (CABG)  "2014"   [name] 03/08/2014  . CARDIAC CATHETERIZATION  12/08/2014  . CATARACT EXTRACTION Left   . CESAREAN SECTION  1995  . CORONARY ANGIOPLASTY WITH STENT PLACEMENT  12/09/2014  . CORONARY ARTERY BYPASS GRAFT    . FOOT FRACTURE SURGERY    . FRACTURE SURGERY    . INTRAMEDULLARY (IM) NAIL INTERTROCHANTERIC Left 11/30/2016   Procedure: INTRAMEDULLARY (IM) NAIL LEFT HIP;  Surgeon: Yolonda Kida, MD;  Location: [facility] OR;  Service: Orthopedics;  Laterality: Left;  . KNEE ARTHROSCOPY Right   . LEFT HEART CATHETERIZATION WITH CORONARY ANGIOGRAM N/A 12/08/2014   Procedure: LEFT HEART CATHETERIZATION WITH CORONARY ANGIOGRAM;  Surgeon: Iran Ouch, MD;  Location: [facility] CATH LAB;  Service: Cardiovascular;  Laterality: N/A;  . LOWER EXTREMITY ANGIOGRAPHY N/A 05/08/2018   Procedure: LOWER EXTREMITY ANGIOGRAPHY;  Surgeon: Fransisco Hertz, MD;  Location: [facility] INVASIVE CV LAB;  Service: Cardiovascular;   Laterality: N/A;  . LOWER EXTREMITY ANGIOGRAPHY N/A 06/05/2018   Procedure: LOWER EXTREMITY ANGIOGRAPHY - Right Leg;  Surgeon: Fransisco Hertz, MD;  Location: [facility] INVASIVE CV LAB;  Service: Cardiovascular;  Laterality: N/A;  . NEUROPLASTY / TRANSPOSITION MEDIAN NERVE AT CARPAL TUNNEL    . ORIF ANKLE FRACTURE Left 07/04/2015   Procedure: OPEN REDUCTION INTERNAL FIXATION (ORIF)  TRIMAL ANKLE FRACTURE;  Surgeon: [name], MD;  Location: [facility] OR;  Service: Orthopedics;  Laterality: Left;  . PERCUTANEOUS CORONARY STENT INTERVENTION (PCI-S) N/A 12/09/2014   Procedure: PERCUTANEOUS CORONARY STENT INTERVENTION (PCI-S);  Surgeon: [name], MD;  Location: [facility] CATH LAB;  Service: Cardiovascular;  Laterality: N/A;  . PERIPHERAL VASCULAR BALLOON ANGIOPLASTY Left 05/08/2018   Procedure: PERIPHERAL VASCULAR BALLOON ANGIOPLASTY;  Surgeon: Fransisco Hertz, MD;  Location: [facility] INVASIVE CV LAB;  Service: Cardiovascular;  Laterality: Left;  Left anterior tibial, and tibioperoneal trunk  . PERIPHERAL VASCULAR INTERVENTION Left 05/08/2018   Procedure: PERIPHERAL VASCULAR INTERVENTION;  Surgeon: Fransisco Hertz, MD;  Location: [facility] INVASIVE CV LAB;  Service: Cardiovascular;  Laterality: Left;  Popliteal artery  . PERIPHERAL VASCULAR INTERVENTION Right 06/05/2018   Procedure: PERIPHERAL VASCULAR INTERVENTION;  Surgeon: Fransisco Hertz, MD;  Location: [facility] INVASIVE CV LAB;  Service: Cardiovascular;  Laterality: Right;  . TUBAL LIGATION  1995     OB History   None      Home Medications    Prior to Admission medications   Medication Sig Start Date End Date Taking? Authorizing Provider  acetaminophen (TYLENOL) 325 MG tablet Take 2 tablets (650 mg total) by mouth every 6 (six) hours as needed for mild pain (or Fever >/= 101). 12/04/16  Yes Zannie Cove, MD  calcitonin, salmon, (MIACALCIN/FORTICAL) 200 UNIT/ACT nasal spray Place 1 spray into alternate nostrils daily. (0900)   Yes [provider]  CALCIUM PO Take 600 mg  by mouth daily.   Yes [provider]  clopidogrel (PLAVIX) 75 MG tablet Take 1 tablet (75 mg total) by mouth daily. 05/08/18 05/08/19 Yes Fransisco Hertz, MD  ergocalciferol (VITAMIN D2) 50000 units capsule Take 50,000 Units by mouth every 30 (thirty) days.    Yes [provider]  escitalopram (LEXAPRO) 20 MG tablet Take 20 mg by mouth daily. (0900)   Yes [provider]  ferrous sulfate 325 (65 FE) MG tablet Take 325 mg by mouth daily with breakfast. (0800)   Yes [provider]  gabapentin (NEURONTIN) 400 MG capsule Take 400 mg by mouth 3 (three) times daily. (0900, 1300, 1700)   Yes [provider]  HYDROcodone-acetaminophen (NORCO/VICODIN) 5-325 MG tablet Take 1 tablet by mouth 3 (three) times daily as needed (for pain.). Patient taking differently: Take 1 tablet by mouth every 8 (eight) hours as needed (for pain.).  07/09/18  Yes Albertine Grates, MD  ibandronate (BONIVA) 150 MG tablet Take 150 mg by mouth every 30 (thirty) days. Take in the morning with a full glass of water, on an empty stomach, and do not take anything else by mouth or lie down  for the next 30 min.   Yes [provider]  insulin aspart (NOVOLOG FLEXPEN) 100 UNIT/ML FlexPen Inject 13 Units into the skin 3 (three) times daily with meals.   Yes [provider]  lisinopril (PRINIVIL,ZESTRIL) 2.5 MG tablet Take 1 tablet (2.5 mg total) by mouth every Monday, Wednesday, and Friday. (0900) Hold if sbp less than 90 07/09/18  Yes Albertine Grates, MD  loratadine (CLARITIN) 10 MG tablet Take 10 mg by mouth daily. (0900)   Yes [provider]  Melatonin 3 MG TABS Take 6 mg by mouth at bedtime. (2100)   Yes [provider]  ondansetron (ZOFRAN) 4 MG tablet Take 4 mg by mouth every 6 (six) hours as needed for nausea or vomiting.   Yes [provider]  polyethylene glycol (MIRALAX / GLYCOLAX) packet Take 17 g by mouth daily. 07/09/18  Yes Albertine Grates, MD  pravastatin  (PRAVACHOL) 40 MG tablet Take 40 mg by mouth daily at 6 PM. (1700)   Yes [provider]  promethazine (PHENERGAN) 25 MG suppository Place 25 mg rectally every 6 (six) hours as needed for nausea or vomiting.   Yes [provider]  solifenacin (VESICARE) 10 MG tablet Take 5 mg by mouth daily.  07/09/18  Yes [provider]  traZODone (DESYREL) 50 MG tablet Take 50 mg by mouth at bedtime. (2000)   Yes [provider]  vitamin C (ASCORBIC ACID) 500 MG tablet Take 500 mg by mouth daily. (0900)   Yes [provider]  GLUCAGEN HYPOKIT 1 MG SOLR injection Inject 1 mg into the muscle once as needed for low blood sugar. CBG < 50 07/09/18   [provider]  glucagon (GLUCAGON EMERGENCY) 1 MG injection Inject 1 mg into the muscle every 15 (fifteen) minutes as needed (for hypoglycemia for CBG<50).    [provider]  insulin aspart (NOVOLOG) 100 UNIT/ML injection Inject 13 Units into the skin 3 (three) times daily before meals. Patient not taking: Reported on 07/17/2018 07/01/17   Romero Belling, MD  insulin detemir (LEVEMIR) 100 UNIT/ML injection Inject 0.2 mLs (20 Units total) into the skin at bedtime. Patient not taking: Reported on 07/17/2018 07/01/17   Romero Belling, MD  ondansetron [facility] ODT) 8 MG disintegrating tablet Take 1 tablet (8 mg total) by mouth every 8 (eight) hours as needed for nausea or vomiting. 07/17/18   Azalia Bilis, MD  senna-docusate (SENOKOT-S) 8.6-50 MG tablet Take 1 tablet by mouth at bedtime. Patient not taking: Reported on 07/17/2018 07/09/18   Albertine Grates, MD    Family History Family History  Problem Relation Age of Onset  . Heart disease Mother   . Heart disease Sister   . Stroke Unknown   . Breast cancer Neg Hx     Social History Social History   Tobacco Use  . Smoking status: Never Smoker  . Smokeless tobacco: Never Used  Substance Use Topics  . Alcohol use: No    Alcohol/week: 0.0 standard drinks  . Drug  use: No     Allergies   Tape; Celebrex [celecoxib]; and Detrol [tolterodine]   Review of Systems Review of Systems  All other systems reviewed and are negative.    Physical Exam Updated Vital Signs BP (!) 128/56 (BP Location: Left Arm)   Pulse (!) 103   Temp 98.3 F (36.8 C) (Oral)   Resp (!) 21   Ht 5\' 3"  (1.6 m)   Wt 79.3 kg   SpO2 96%  BMI 30.97 kg/m   Physical Exam  Constitutional: She is oriented to person, place, and time. She appears well-developed and well-nourished. No distress.  HENT:  Head: Normocephalic and atraumatic.  Eyes: EOM are normal.  Neck: Normal range of motion.  Cardiovascular: Regular rhythm and normal heart sounds.  Mild tachycardia  Pulmonary/Chest: Effort normal and breath sounds normal.  Abdominal: Soft. She exhibits no distension.  Mild upper abdominal tenderness.  No guarding rebound.  No peritonitis  Musculoskeletal: Normal range of motion.  Neurological: She is alert and oriented to person, place, and time.  Skin: Skin is warm and dry.  Psychiatric: She has a normal mood and affect. Judgment normal.  Nursing note and vitals reviewed.    ED Treatments / Results  Labs (all labs ordered are listed, but only abnormal results are displayed) Labs Reviewed  CBC - Abnormal; Notable for the following components:      Result Value   WBC 18.1 (*)    Platelets 499 (*)    All other components within normal limits  COMPREHENSIVE METABOLIC PANEL - Abnormal; Notable for the following components:   Chloride 95 (*)    CO2 20 (*)    Glucose, Bld 360 (*)    BUN 27 (*)    Creatinine, Ser 1.54 (*)    GFR calc non Af Amer 36 (*)    GFR calc Af Amer 42 (*)    Anion gap 22 (*)    All other components within normal limits  URINALYSIS, ROUTINE W REFLEX MICROSCOPIC - Abnormal; Notable for the following components:   APPearance HAZY (*)    Glucose, UA >=500 (*)    Ketones, ur 80 (*)    Leukocytes, UA SMALL (*)    All other components within  normal limits  LIPASE, BLOOD    EKG None  Radiology Ct Abdomen Pelvis W Contrast  Result Date: 07/17/2018 CLINICAL DATA:  Nausea, vomiting and abdominal pain. History of stroke. EXAM: CT ABDOMEN AND PELVIS WITH CONTRAST TECHNIQUE: Multidetector CT imaging of the abdomen and pelvis was performed using the standard protocol following bolus administration of intravenous contrast. CONTRAST:  65mL ISOVUE-300 IOPAMIDOL (ISOVUE-300) INJECTION 61% COMPARISON:  Abdominal radiograph July 17, 2018 and CT abdomen and pelvis Mar 24, 2016 FINDINGS: LOWER CHEST: Dependent atelectasis. Included heart size is normal. Status post median sternotomy. Similarly thickened edematous distal esophagus. HEPATOBILIARY: Liver and gallbladder are normal. PANCREAS: Normal. SPLEEN: Normal. ADRENALS/URINARY TRACT: Kidneys are orthotopic, demonstrating symmetric enhancement. No nephrolithiasis, hydronephrosis or solid renal masses. Symmetric advanced vascular calcifications. The unopacified ureters are normal in course and caliber. Delayed imaging through the kidneys demonstrates symmetric prompt contrast excretion within the proximal urinary collecting system. Urinary bladder is partially distended and unremarkable. Normal adrenal glands. STOMACH/BOWEL: The stomach, small and large bowel are normal in caliber without inflammatory changes. Multiple loops of small and large bowel course freely in and out of umbilical hernia. Normal appendix. VASCULAR/LYMPHATIC: Aortoiliac vessels are normal in course and caliber. Moderate calcific atherosclerosis. No lymphadenopathy by CT size criteria. 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 OTHER: No intraperitoneal free fluid or free air. MUSCULOSKELETAL: Nonacute. Subacute appearing T11 superior endplate moderate compression fracture. Moderate old T12 compression fracture. Wide necked moderate umbilical hernia. Asymmetrically  atrophic LEFT iliopsoas muscles. LEFT femur ORIF with heterotopic ossification. Symmetric anterior abdominal wall soft tissue densities within subcutaneous fat most compatible with injection granulomas IMPRESSION: 1. Similarly thickened edematous distal esophagus, differential diagnosis includes recurrent esophagitis  or mass. Recommend endoscopy. 2. No acute intra-abdominal or pelvic process. 3. Subacute appearing moderate T11 compression fracture. Aortic Atherosclerosis (ICD10-I70.0). Electronically Signed   By: Awilda Metro M.D.   On: 07/17/2018 20:23   Dg Abd 2 Views  Result Date: 07/17/2018 CLINICAL DATA:  Nausea and vomiting beginning last night. COMPARISON:  None available for comparison at time of study interpretation. FINDINGS: The bowel gas pattern is normal. Moderate retained large bowel stool. No intra-abdominal mass effect. Dystrophic calcification LEFT pelvis consistent with fibroid. LEFT femur ORIF partially imaged. Vascular calcifications. No intraperitoneal free air. IMPRESSION: 1. Moderate volume retained large bowel stool. Nonobstructive bowel gas pattern. Electronically Signed   By: Awilda Metro M.D.   On: 07/17/2018 17:49    Procedures Procedures (including critical care time)  Medications Ordered in ED Medications  iopamidol (ISOVUE-300) 61 % injection (has no administration in time range)  sodium chloride 0.9 % bolus 1,000 mL (1,000 mLs Intravenous New Bag/Given 07/17/18 1728)  ondansetron (ZOFRAN) injection 4 mg (4 mg Intravenous Given 07/17/18 1729)  iopamidol (ISOVUE-300) 61 % injection 100 mL (80 mLs Intravenous Contrast Given 07/17/18 1939)     Initial Impression / Assessment and Plan / ED Course  I have reviewed the triage vital signs and the nursing notes.  Pertinent labs & imaging results that were available during my care of the patient were reviewed by me and considered in my medical decision making (see chart for details).     Work-up in the emergency  department that significant abnormality.  No vomiting in the ER.  CT scan reassuring.  Outpatient GI follow-up will be beneficial.  Currently tolerating water in the emergency department without difficulty.  She states she feels much better.  Patient is stable for discharge.  Instructed to return to the ER for new or worsening symptoms.  She is at [facility] nursing home and they will be able to continue to monitor her there.  Final Clinical Impressions(s) / ED Diagnoses   Final diagnoses:  Vomiting  Nausea and vomiting, intractability of vomiting not specified, unspecified vomiting type  Acute abdominal pain    ED Discharge Orders         Ordered    ondansetron (ZOFRAN ODT) 8 MG disintegrating tablet  Every 8 hours PRN     07/17/18 2051           Azalia Bilis, MD 07/17/18 2053

## 2018-07-18 ENCOUNTER — Inpatient Hospital Stay (HOSPITAL_COMMUNITY)
Admission: EM | Admit: 2018-07-18 | Discharge: 2018-08-19 | DRG: 870 | Disposition: E | Payer: Medicare Other | Attending: Pulmonary Disease | Admitting: Pulmonary Disease

## 2018-07-18 ENCOUNTER — Inpatient Hospital Stay (HOSPITAL_COMMUNITY): Payer: Medicare Other

## 2018-07-18 ENCOUNTER — Encounter (HOSPITAL_COMMUNITY): Payer: Self-pay | Admitting: Pulmonary Disease

## 2018-07-18 ENCOUNTER — Emergency Department (HOSPITAL_COMMUNITY): Payer: Medicare Other

## 2018-07-18 DIAGNOSIS — G9341 Metabolic encephalopathy: Secondary | ICD-10-CM | POA: Diagnosis present

## 2018-07-18 DIAGNOSIS — L8990 Pressure ulcer of unspecified site, unspecified stage: Secondary | ICD-10-CM | POA: Diagnosis present

## 2018-07-18 DIAGNOSIS — K920 Hematemesis: Secondary | ICD-10-CM | POA: Diagnosis present

## 2018-07-18 DIAGNOSIS — Z515 Encounter for palliative care: Secondary | ICD-10-CM | POA: Diagnosis present

## 2018-07-18 DIAGNOSIS — Z66 Do not resuscitate: Secondary | ICD-10-CM | POA: Diagnosis not present

## 2018-07-18 DIAGNOSIS — J9601 Acute respiratory failure with hypoxia: Secondary | ICD-10-CM | POA: Diagnosis present

## 2018-07-18 DIAGNOSIS — Z794 Long term (current) use of insulin: Secondary | ICD-10-CM

## 2018-07-18 DIAGNOSIS — J690 Pneumonitis due to inhalation of food and vomit: Secondary | ICD-10-CM | POA: Diagnosis not present

## 2018-07-18 DIAGNOSIS — E872 Acidosis, unspecified: Secondary | ICD-10-CM

## 2018-07-18 DIAGNOSIS — K922 Gastrointestinal hemorrhage, unspecified: Secondary | ICD-10-CM | POA: Diagnosis not present

## 2018-07-18 DIAGNOSIS — Z9109 Other allergy status, other than to drugs and biological substances: Secondary | ICD-10-CM

## 2018-07-18 DIAGNOSIS — Z8249 Family history of ischemic heart disease and other diseases of the circulatory system: Secondary | ICD-10-CM

## 2018-07-18 DIAGNOSIS — F329 Major depressive disorder, single episode, unspecified: Secondary | ICD-10-CM | POA: Diagnosis present

## 2018-07-18 DIAGNOSIS — Z4659 Encounter for fitting and adjustment of other gastrointestinal appliance and device: Secondary | ICD-10-CM

## 2018-07-18 DIAGNOSIS — I509 Heart failure, unspecified: Secondary | ICD-10-CM | POA: Diagnosis present

## 2018-07-18 DIAGNOSIS — Z952 Presence of prosthetic heart valve: Secondary | ICD-10-CM | POA: Diagnosis not present

## 2018-07-18 DIAGNOSIS — R6520 Severe sepsis without septic shock: Secondary | ICD-10-CM | POA: Diagnosis not present

## 2018-07-18 DIAGNOSIS — I252 Old myocardial infarction: Secondary | ICD-10-CM

## 2018-07-18 DIAGNOSIS — Z01818 Encounter for other preprocedural examination: Secondary | ICD-10-CM

## 2018-07-18 DIAGNOSIS — Z7189 Other specified counseling: Secondary | ICD-10-CM | POA: Diagnosis not present

## 2018-07-18 DIAGNOSIS — Z951 Presence of aortocoronary bypass graft: Secondary | ICD-10-CM | POA: Diagnosis not present

## 2018-07-18 DIAGNOSIS — Z7902 Long term (current) use of antithrombotics/antiplatelets: Secondary | ICD-10-CM

## 2018-07-18 DIAGNOSIS — Z978 Presence of other specified devices: Secondary | ICD-10-CM

## 2018-07-18 DIAGNOSIS — A419 Sepsis, unspecified organism: Principal | ICD-10-CM

## 2018-07-18 DIAGNOSIS — M069 Rheumatoid arthritis, unspecified: Secondary | ICD-10-CM | POA: Diagnosis present

## 2018-07-18 DIAGNOSIS — R6521 Severe sepsis with septic shock: Secondary | ICD-10-CM | POA: Diagnosis present

## 2018-07-18 DIAGNOSIS — E8770 Fluid overload, unspecified: Secondary | ICD-10-CM | POA: Diagnosis not present

## 2018-07-18 DIAGNOSIS — I110 Hypertensive heart disease with heart failure: Secondary | ICD-10-CM | POA: Diagnosis present

## 2018-07-18 DIAGNOSIS — J9690 Respiratory failure, unspecified, unspecified whether with hypoxia or hypercapnia: Secondary | ICD-10-CM

## 2018-07-18 DIAGNOSIS — R0681 Apnea, not elsewhere classified: Secondary | ICD-10-CM | POA: Diagnosis not present

## 2018-07-18 DIAGNOSIS — I2510 Atherosclerotic heart disease of native coronary artery without angina pectoris: Secondary | ICD-10-CM | POA: Diagnosis present

## 2018-07-18 DIAGNOSIS — Z7983 Long term (current) use of bisphosphonates: Secondary | ICD-10-CM

## 2018-07-18 DIAGNOSIS — E876 Hypokalemia: Secondary | ICD-10-CM | POA: Diagnosis present

## 2018-07-18 DIAGNOSIS — Z8672 Personal history of thrombophlebitis: Secondary | ICD-10-CM

## 2018-07-18 DIAGNOSIS — Z886 Allergy status to analgesic agent status: Secondary | ICD-10-CM

## 2018-07-18 DIAGNOSIS — E1011 Type 1 diabetes mellitus with ketoacidosis with coma: Secondary | ICD-10-CM | POA: Diagnosis present

## 2018-07-18 DIAGNOSIS — R0689 Other abnormalities of breathing: Secondary | ICD-10-CM

## 2018-07-18 DIAGNOSIS — K210 Gastro-esophageal reflux disease with esophagitis: Secondary | ICD-10-CM | POA: Diagnosis present

## 2018-07-18 DIAGNOSIS — E785 Hyperlipidemia, unspecified: Secondary | ICD-10-CM | POA: Diagnosis present

## 2018-07-18 DIAGNOSIS — I4891 Unspecified atrial fibrillation: Secondary | ICD-10-CM | POA: Diagnosis present

## 2018-07-18 DIAGNOSIS — R112 Nausea with vomiting, unspecified: Secondary | ICD-10-CM | POA: Diagnosis present

## 2018-07-18 DIAGNOSIS — Z9911 Dependence on respirator [ventilator] status: Secondary | ICD-10-CM | POA: Diagnosis not present

## 2018-07-18 DIAGNOSIS — Z452 Encounter for adjustment and management of vascular access device: Secondary | ICD-10-CM

## 2018-07-18 DIAGNOSIS — Z823 Family history of stroke: Secondary | ICD-10-CM

## 2018-07-18 DIAGNOSIS — Z955 Presence of coronary angioplasty implant and graft: Secondary | ICD-10-CM | POA: Diagnosis not present

## 2018-07-18 DIAGNOSIS — R791 Abnormal coagulation profile: Secondary | ICD-10-CM | POA: Diagnosis present

## 2018-07-18 DIAGNOSIS — Z9289 Personal history of other medical treatment: Secondary | ICD-10-CM

## 2018-07-18 DIAGNOSIS — Z87440 Personal history of urinary (tract) infections: Secondary | ICD-10-CM

## 2018-07-18 DIAGNOSIS — Z8673 Personal history of transient ischemic attack (TIA), and cerebral infarction without residual deficits: Secondary | ICD-10-CM

## 2018-07-18 DIAGNOSIS — Z79899 Other long term (current) drug therapy: Secondary | ICD-10-CM

## 2018-07-18 DIAGNOSIS — Z888 Allergy status to other drugs, medicaments and biological substances status: Secondary | ICD-10-CM

## 2018-07-18 LAB — CBC WITH DIFFERENTIAL/PLATELET
BASOS ABS: 0.3 10*3/uL — AB (ref 0.0–0.1)
BASOS PCT: 0 %
Basophils Absolute: 0 10*3/uL (ref 0.0–0.1)
Basophils Relative: 1 %
EOS ABS: 0 10*3/uL (ref 0.0–0.7)
Eosinophils Absolute: 0 10*3/uL (ref 0.0–0.7)
Eosinophils Relative: 0 %
Eosinophils Relative: 0 %
HEMATOCRIT: 43 % (ref 36.0–46.0)
HEMATOCRIT: 33.9 % — AB (ref 36.0–46.0)
HEMOGLOBIN: 11 g/dL — AB (ref 12.0–15.0)
Hemoglobin: 12 g/dL (ref 12.0–15.0)
LYMPHS ABS: 3.4 10*3/uL (ref 0.7–4.0)
LYMPHS ABS: 1.6 10*3/uL (ref 0.7–4.0)
Lymphocytes Relative: 10 %
Lymphocytes Relative: 6 %
MCH: 28.8 pg (ref 26.0–34.0)
MCH: 29.4 pg (ref 26.0–34.0)
MCHC: 27.9 g/dL — ABNORMAL LOW (ref 30.0–36.0)
MCHC: 32.4 g/dL (ref 30.0–36.0)
MCV: 103.1 fL — ABNORMAL HIGH (ref 78.0–100.0)
MCV: 90.6 fL (ref 78.0–100.0)
MONO ABS: 1.4 10*3/uL — AB (ref 0.1–1.0)
MONOS PCT: 4 %
Monocytes Absolute: 0.9 10*3/uL (ref 0.1–1.0)
Monocytes Relative: 3 %
NEUTROS ABS: 23.7 10*3/uL — AB (ref 1.7–7.7)
NEUTROS PCT: 85 %
NEUTROS PCT: 91 %
Neutro Abs: 28.9 10*3/uL — ABNORMAL HIGH (ref 1.7–7.7)
PLATELETS: 538 10*3/uL — AB (ref 150–400)
Platelets: 415 10*3/uL — ABNORMAL HIGH (ref 150–400)
RBC: 4.17 MIL/uL (ref 3.87–5.11)
RBC: 3.74 MIL/uL — AB (ref 3.87–5.11)
RDW: 14.6 % (ref 11.5–15.5)
RDW: 14.2 % (ref 11.5–15.5)
WBC: 34 10*3/uL — AB (ref 4.0–10.5)
WBC: 26.4 10*3/uL — AB (ref 4.0–10.5)

## 2018-07-18 LAB — URINALYSIS, ROUTINE W REFLEX MICROSCOPIC
Bacteria, UA: NONE SEEN
Bilirubin Urine: NEGATIVE
Hgb urine dipstick: NEGATIVE
Ketones, ur: 80 mg/dL — AB
Leukocytes, UA: NEGATIVE
Nitrite: NEGATIVE
PH: 5 (ref 5.0–8.0)
Protein, ur: NEGATIVE mg/dL
Specific Gravity, Urine: 1.021 (ref 1.005–1.030)

## 2018-07-18 LAB — BASIC METABOLIC PANEL
ANION GAP: 9 (ref 5–15)
Anion gap: 23 — ABNORMAL HIGH (ref 5–15)
Anion gap: 17 — ABNORMAL HIGH (ref 5–15)
Anion gap: 8 (ref 5–15)
Anion gap: 11 (ref 5–15)
BUN: 25 mg/dL — AB (ref 6–20)
BUN: 23 mg/dL — AB (ref 6–20)
BUN: 21 mg/dL — ABNORMAL HIGH (ref 6–20)
BUN: 19 mg/dL (ref 6–20)
BUN: 19 mg/dL (ref 6–20)
CHLORIDE: 102 mmol/L (ref 98–111)
CHLORIDE: 108 mmol/L (ref 98–111)
CHLORIDE: 111 mmol/L (ref 98–111)
CHLORIDE: 109 mmol/L (ref 98–111)
CHLORIDE: 110 mmol/L (ref 98–111)
CO2: 15 mmol/L — AB (ref 22–32)
CO2: 22 mmol/L (ref 22–32)
CO2: 30 mmol/L (ref 22–32)
CO2: 27 mmol/L (ref 22–32)
CO2: 27 mmol/L (ref 22–32)
CREATININE: 2.09 mg/dL — AB (ref 0.44–1.00)
CREATININE: 1.89 mg/dL — AB (ref 0.44–1.00)
CREATININE: 1.4 mg/dL — AB (ref 0.44–1.00)
Calcium: 8.2 mg/dL — ABNORMAL LOW (ref 8.9–10.3)
Calcium: 8.6 mg/dL — ABNORMAL LOW (ref 8.9–10.3)
Calcium: 8.5 mg/dL — ABNORMAL LOW (ref 8.9–10.3)
Calcium: 8.2 mg/dL — ABNORMAL LOW (ref 8.9–10.3)
Calcium: 8.1 mg/dL — ABNORMAL LOW (ref 8.9–10.3)
Creatinine, Ser: 1.37 mg/dL — ABNORMAL HIGH (ref 0.44–1.00)
Creatinine, Ser: 1.27 mg/dL — ABNORMAL HIGH (ref 0.44–1.00)
GFR calc Af Amer: 29 mL/min — ABNORMAL LOW (ref >60)
GFR calc Af Amer: 33 mL/min — ABNORMAL LOW (ref >60)
GFR calc non Af Amer: 25 mL/min — ABNORMAL LOW (ref >60)
GFR calc non Af Amer: 28 mL/min — ABNORMAL LOW (ref >60)
GFR calc non Af Amer: 41 mL/min — ABNORMAL LOW (ref >60)
GFR calc non Af Amer: 42 mL/min — ABNORMAL LOW (ref >60)
GFR calc non Af Amer: 46 mL/min — ABNORMAL LOW (ref >60)
GFR, EST AFRICAN AMERICAN: 47 mL/min — AB (ref >60)
GFR, EST AFRICAN AMERICAN: 49 mL/min — AB (ref >60)
GFR, EST AFRICAN AMERICAN: 53 mL/min — AB (ref >60)
Glucose, Bld: 715 mg/dL (ref 70–99)
Glucose, Bld: 332 mg/dL — ABNORMAL HIGH (ref 70–99)
Glucose, Bld: 172 mg/dL — ABNORMAL HIGH (ref 70–99)
Glucose, Bld: 218 mg/dL — ABNORMAL HIGH (ref 70–99)
Glucose, Bld: 210 mg/dL — ABNORMAL HIGH (ref 70–99)
POTASSIUM: 3.5 mmol/L (ref 3.5–5.1)
POTASSIUM: 4.2 mmol/L (ref 3.5–5.1)
POTASSIUM: 4.5 mmol/L (ref 3.5–5.1)
Potassium: 4.2 mmol/L (ref 3.5–5.1)
Potassium: 3.4 mmol/L — ABNORMAL LOW (ref 3.5–5.1)
SODIUM: 147 mmol/L — AB (ref 135–145)
SODIUM: 149 mmol/L — AB (ref 135–145)
SODIUM: 147 mmol/L — AB (ref 135–145)
SODIUM: 146 mmol/L — AB (ref 135–145)
Sodium: 140 mmol/L (ref 135–145)

## 2018-07-18 LAB — COMPREHENSIVE METABOLIC PANEL
ALT: 10 U/L (ref 0–44)
AST: 27 U/L (ref 15–41)
Albumin: 3.4 g/dL — ABNORMAL LOW (ref 3.5–5.0)
Alkaline Phosphatase: 111 U/L (ref 38–126)
BILIRUBIN TOTAL: 2.3 mg/dL — AB (ref 0.3–1.2)
BUN: 29 mg/dL — ABNORMAL HIGH (ref 6–20)
CALCIUM: 9 mg/dL (ref 8.9–10.3)
CO2: <7 mmol/L — ABNORMAL LOW (ref 22–32)
Chloride: 96 mmol/L — ABNORMAL LOW (ref 98–111)
Creatinine, Ser: 2.38 mg/dL — ABNORMAL HIGH (ref 0.44–1.00)
GFR, EST AFRICAN AMERICAN: 25 mL/min — AB (ref >60)
GFR, EST NON AFRICAN AMERICAN: 21 mL/min — AB (ref >60)
Glucose, Bld: 913 mg/dL (ref 70–99)
POTASSIUM: 6 mmol/L — AB (ref 3.5–5.1)
Sodium: 136 mmol/L (ref 135–145)
Total Protein: 6.9 g/dL (ref 6.5–8.1)

## 2018-07-18 LAB — LACTIC ACID, PLASMA
LACTIC ACID, VENOUS: 6.9 mmol/L — AB (ref 0.5–1.9)
Lactic Acid, Venous: 6.6 mmol/L (ref 0.5–1.9)
Lactic Acid, Venous: 1.7 mmol/L (ref 0.5–1.9)

## 2018-07-18 LAB — GLUCOSE, CAPILLARY
GLUCOSE-CAPILLARY: 236 mg/dL — AB (ref 70–99)
GLUCOSE-CAPILLARY: 148 mg/dL — AB (ref 70–99)
GLUCOSE-CAPILLARY: 130 mg/dL — AB (ref 70–99)
GLUCOSE-CAPILLARY: 110 mg/dL — AB (ref 70–99)
GLUCOSE-CAPILLARY: 159 mg/dL — AB (ref 70–99)
Glucose-Capillary: >600 mg/dL (ref 70–99)
Glucose-Capillary: 552 mg/dL (ref 70–99)
Glucose-Capillary: 414 mg/dL — ABNORMAL HIGH (ref 70–99)
Glucose-Capillary: 300 mg/dL — ABNORMAL HIGH (ref 70–99)
Glucose-Capillary: 193 mg/dL — ABNORMAL HIGH (ref 70–99)
Glucose-Capillary: 161 mg/dL — ABNORMAL HIGH (ref 70–99)
Glucose-Capillary: 197 mg/dL — ABNORMAL HIGH (ref 70–99)
Glucose-Capillary: 190 mg/dL — ABNORMAL HIGH (ref 70–99)
Glucose-Capillary: 215 mg/dL — ABNORMAL HIGH (ref 70–99)

## 2018-07-18 LAB — TYPE AND SCREEN
ABO/RH(D): A POS
Antibody Screen: NEGATIVE

## 2018-07-18 LAB — I-STAT ARTERIAL BLOOD GAS, ED
Acid-base deficit: 14 mmol/L — ABNORMAL HIGH (ref 0.0–2.0)
BICARBONATE: 9.4 mmol/L — AB (ref 20.0–28.0)
O2 Saturation: 100 %
Patient temperature: 98.6
TCO2: 10 mmol/L — ABNORMAL LOW (ref 22–32)
pCO2 arterial: 17.3 mmHg — CL (ref 32.0–48.0)
pH, Arterial: 7.345 — ABNORMAL LOW (ref 7.350–7.450)
pO2, Arterial: 208 mmHg — ABNORMAL HIGH (ref 83.0–108.0)

## 2018-07-18 LAB — BLOOD GAS, ARTERIAL
ACID-BASE DEFICIT: 9.1 mmol/L — AB (ref 0.0–2.0)
BICARBONATE: 15.8 mmol/L — AB (ref 20.0–28.0)
DRAWN BY: 280981
FIO2: 40
LHR: 24 {breaths}/min
MECHVT: 400 mL
O2 SAT: 95.1 %
PATIENT TEMPERATURE: 98.6
PEEP/CPAP: 5 cmH2O
PH ART: 7.318 — AB (ref 7.350–7.450)
pCO2 arterial: 31.7 mmHg — ABNORMAL LOW (ref 32.0–48.0)
pO2, Arterial: 78 mmHg — ABNORMAL LOW (ref 83.0–108.0)

## 2018-07-18 LAB — I-STAT BETA HCG BLOOD, ED (MC, WL, AP ONLY): I-stat hCG, quantitative: 11.3 m[IU]/mL — ABNORMAL HIGH (ref <5)

## 2018-07-18 LAB — I-STAT CG4 LACTIC ACID, ED: Lactic Acid, Venous: 9.78 mmol/L (ref 0.5–1.9)

## 2018-07-18 LAB — I-STAT CHEM 8, ED
BUN: 36 mg/dL — AB (ref 6–20)
CALCIUM ION: 1.09 mmol/L — AB (ref 1.15–1.40)
CHLORIDE: 106 mmol/L (ref 98–111)
CREATININE: 1.3 mg/dL — AB (ref 0.44–1.00)
HCT: 35 % — ABNORMAL LOW (ref 36.0–46.0)
Hemoglobin: 11.9 g/dL — ABNORMAL LOW (ref 12.0–15.0)
Potassium: 4.7 mmol/L (ref 3.5–5.1)
Sodium: 139 mmol/L (ref 135–145)
TCO2: 12 mmol/L — ABNORMAL LOW (ref 22–32)

## 2018-07-18 LAB — BRAIN NATRIURETIC PEPTIDE: B NATRIURETIC PEPTIDE 5: 214 pg/mL — AB (ref 0.0–100.0)

## 2018-07-18 LAB — PROCALCITONIN: Procalcitonin: 5.61 ng/mL

## 2018-07-18 LAB — BETA-HYDROXYBUTYRIC ACID: Beta-Hydroxybutyric Acid: >8 mmol/L — ABNORMAL HIGH (ref 0.05–0.27)

## 2018-07-18 LAB — MRSA PCR SCREENING: MRSA by PCR: NEGATIVE

## 2018-07-18 LAB — TROPONIN I: Troponin I: <0.03 ng/mL (ref <0.03)

## 2018-07-18 LAB — PROTIME-INR
INR: 2.78
Prothrombin Time: 29.1 seconds — ABNORMAL HIGH (ref 11.4–15.2)

## 2018-07-18 MED ORDER — VITAMIN K1 1 MG/0.5ML IJ SOLN
2.0000 mg | Freq: Once | INTRAMUSCULAR | Status: AC
  Administered 2018-07-18: 2 mg via INTRAMUSCULAR
  Filled 2018-07-18: qty 1

## 2018-07-18 MED ORDER — POTASSIUM CHLORIDE 10 MEQ/50ML IV SOLN: 10.0000 meq | INTRAVENOUS | Status: DC

## 2018-07-18 MED ORDER — INSULIN DETEMIR 100 UNIT/ML ~~LOC~~ SOLN
8.0000 [IU] | SUBCUTANEOUS | Status: DC
  Administered 2018-07-18 – 2018-07-19 (×2): 8 [IU] via SUBCUTANEOUS
  Filled 2018-07-18 (×3): qty 0.08

## 2018-07-18 MED ORDER — SODIUM CHLORIDE 0.9 % IV SOLN
INTRAVENOUS | Status: DC | PRN
  Administered 2018-07-18 – 2018-07-21 (×3): 250 mL via INTRAVENOUS

## 2018-07-18 MED ORDER — ORAL CARE MOUTH RINSE
15.0000 mL | OROMUCOSAL | Status: DC
  Administered 2018-07-18 – 2018-07-23 (×51): 15 mL via OROMUCOSAL

## 2018-07-18 MED ORDER — SODIUM BICARBONATE 8.4 % IV SOLN
50.0000 meq | Freq: Once | INTRAVENOUS | Status: AC
  Administered 2018-07-18: 50 meq via INTRAVENOUS

## 2018-07-18 MED ORDER — ROCURONIUM BROMIDE 50 MG/5ML IV SOLN
INTRAVENOUS | Status: AC | PRN
  Administered 2018-07-18: 80 mg via INTRAVENOUS

## 2018-07-18 MED ORDER — ACETAMINOPHEN 650 MG RE SUPP
650.0000 mg | Freq: Once | RECTAL | Status: AC
  Administered 2018-07-18: 650 mg via RECTAL
  Filled 2018-07-18: qty 1

## 2018-07-18 MED ORDER — VANCOMYCIN HCL IN DEXTROSE 1-5 GM/200ML-% IV SOLN
1000.0000 mg | Freq: Once | INTRAVENOUS | Status: AC
  Administered 2018-07-18: 1000 mg via INTRAVENOUS
  Filled 2018-07-18: qty 200

## 2018-07-18 MED ORDER — INSULIN ASPART 100 UNIT/ML ~~LOC~~ SOLN
2.0000 [IU] | SUBCUTANEOUS | Status: DC
  Administered 2018-07-18: 4 [IU] via SUBCUTANEOUS
  Administered 2018-07-18: 6 [IU] via SUBCUTANEOUS
  Administered 2018-07-19: 4 [IU] via SUBCUTANEOUS
  Administered 2018-07-19: 2 [IU] via SUBCUTANEOUS
  Administered 2018-07-19 (×3): 4 [IU] via SUBCUTANEOUS
  Administered 2018-07-20 (×2): 2 [IU] via SUBCUTANEOUS
  Administered 2018-07-20: 4 [IU] via SUBCUTANEOUS

## 2018-07-18 MED ORDER — SODIUM CHLORIDE 0.9 % IV SOLN: 250.0000 mL | INTRAVENOUS | Status: DC | PRN

## 2018-07-18 MED ORDER — SODIUM CHLORIDE 0.9 % IV SOLN
1.0000 g | Freq: Two times a day (BID) | INTRAVENOUS | Status: DC
  Administered 2018-07-18 – 2018-07-19 (×3): 1 g via INTRAVENOUS
  Filled 2018-07-18 (×3): qty 1

## 2018-07-18 MED ORDER — FENTANYL 2500MCG IN NS 250ML (10MCG/ML) PREMIX INFUSION
25.0000 ug/h | INTRAVENOUS | Status: DC
  Administered 2018-07-18: 175 ug/h via INTRAVENOUS
  Administered 2018-07-18: 50 ug/h via INTRAVENOUS
  Administered 2018-07-18: 250 ug/h via INTRAVENOUS
  Administered 2018-07-19: 150 ug/h via INTRAVENOUS
  Administered 2018-07-19: 300 ug/h via INTRAVENOUS
  Administered 2018-07-20: 200 ug/h via INTRAVENOUS
  Administered 2018-07-21: 100 ug/h via INTRAVENOUS
  Administered 2018-07-22: 275 ug/h via INTRAVENOUS
  Administered 2018-07-23: 200 ug/h via INTRAVENOUS
  Filled 2018-07-18 (×8): qty 250

## 2018-07-18 MED ORDER — FENTANYL BOLUS VIA INFUSION
50.0000 ug | INTRAVENOUS | Status: DC | PRN
  Administered 2018-07-18 – 2018-07-23 (×14): 50 ug via INTRAVENOUS
  Filled 2018-07-18: qty 50

## 2018-07-18 MED ORDER — FENTANYL CITRATE (PF) 100 MCG/2ML IJ SOLN: 50.0000 ug | Freq: Once | INTRAMUSCULAR | Status: DC

## 2018-07-18 MED ORDER — LACTATED RINGERS IV BOLUS (SEPSIS)
1000.0000 mL | Freq: Once | INTRAVENOUS | Status: AC
  Administered 2018-07-18: 1000 mL via INTRAVENOUS

## 2018-07-18 MED ORDER — CHLORHEXIDINE GLUCONATE 0.12% ORAL RINSE (MEDLINE KIT)
15.0000 mL | Freq: Two times a day (BID) | OROMUCOSAL | Status: DC
  Administered 2018-07-18 – 2018-07-23 (×11): 15 mL via OROMUCOSAL

## 2018-07-18 MED ORDER — ETOMIDATE 2 MG/ML IV SOLN
INTRAVENOUS | Status: AC | PRN
  Administered 2018-07-18: 20 mg via INTRAVENOUS

## 2018-07-18 MED ORDER — METRONIDAZOLE IN NACL 5-0.79 MG/ML-% IV SOLN
500.0000 mg | Freq: Three times a day (TID) | INTRAVENOUS | Status: DC
  Administered 2018-07-18 – 2018-07-19 (×4): 500 mg via INTRAVENOUS
  Filled 2018-07-18 (×4): qty 100

## 2018-07-18 MED ORDER — DEXTROSE-NACL 5-0.45 % IV SOLN
INTRAVENOUS | Status: DC
  Administered 2018-07-18 – 2018-07-19 (×2): via INTRAVENOUS

## 2018-07-18 MED ORDER — PANTOPRAZOLE SODIUM 40 MG IV SOLR
40.0000 mg | Freq: Two times a day (BID) | INTRAVENOUS | Status: DC
  Administered 2018-07-18 – 2018-07-20 (×5): 40 mg via INTRAVENOUS
  Filled 2018-07-18 (×6): qty 40

## 2018-07-18 MED ORDER — SODIUM CHLORIDE 0.9 % IV SOLN
INTRAVENOUS | Status: DC
  Administered 2018-07-18: 4.9 [IU]/h via INTRAVENOUS
  Filled 2018-07-18: qty 1

## 2018-07-18 MED ORDER — INSULIN ASPART 100 UNIT/ML ~~LOC~~ SOLN: 0.0000 [IU] | SUBCUTANEOUS | Status: DC

## 2018-07-18 MED ORDER — SODIUM BICARBONATE 8.4 % IV SOLN
100.0000 meq | Freq: Once | INTRAVENOUS | Status: AC
  Administered 2018-07-18: 100 meq via INTRAVENOUS
  Filled 2018-07-18: qty 50

## 2018-07-18 MED ORDER — PROPOFOL 1000 MG/100ML IV EMUL
INTRAVENOUS | Status: AC
  Filled 2018-07-18: qty 100

## 2018-07-18 MED ORDER — LACTATED RINGERS IV BOLUS (SEPSIS): 500.0000 mL | Freq: Once | INTRAVENOUS | Status: DC

## 2018-07-18 MED ORDER — INSULIN ASPART 100 UNIT/ML ~~LOC~~ SOLN
10.0000 [IU] | Freq: Once | SUBCUTANEOUS | Status: AC
  Administered 2018-07-18: 10 [IU] via INTRAVENOUS

## 2018-07-18 MED ORDER — VANCOMYCIN HCL IN DEXTROSE 1-5 GM/200ML-% IV SOLN
1000.0000 mg | INTRAVENOUS | Status: DC
  Administered 2018-07-19: 1000 mg via INTRAVENOUS
  Filled 2018-07-18: qty 200

## 2018-07-18 MED ORDER — SODIUM CHLORIDE 0.9 % IV SOLN
INTRAVENOUS | Status: DC
  Administered 2018-07-18: 09:00:00 via INTRAVENOUS

## 2018-07-18 MED ORDER — SODIUM CHLORIDE 0.9 % IV SOLN
2.0000 g | Freq: Once | INTRAVENOUS | Status: AC
  Administered 2018-07-18: 2 g via INTRAVENOUS
  Filled 2018-07-18: qty 2

## 2018-07-18 MED ORDER — SODIUM CHLORIDE 0.9 % IV SOLN: INTRAVENOUS | Status: DC

## 2018-07-18 MED ORDER — POTASSIUM CHLORIDE 10 MEQ/50ML IV SOLN
10.0000 meq | INTRAVENOUS | Status: AC
  Administered 2018-07-18 (×6): 10 meq via INTRAVENOUS
  Filled 2018-07-18 (×6): qty 50

## 2018-07-18 MED ORDER — SODIUM BICARBONATE 8.4 % IV SOLN
INTRAVENOUS | Status: AC
  Administered 2018-07-18: 50 meq via INTRAVENOUS
  Filled 2018-07-18: qty 50

## 2018-07-18 NOTE — H&P (Addendum)
Madison [name]  TFT:[id number] DOB: February 27, 1961 DOA: 08-04-2018 PCP: Renford Dills, MD    LOS: 0 days   Reason for Consult / Chief Complaint:  Respiratory Distress/Sepsis   Consulting MD and date of consult:  Dr. Bebe Shaggy, EDP 8/30  HPI/summary of hospital stay:  57 year old female with PMH of aortic valve replacement on Coumadin, Depression, HTN, DM, CVA with residual deficits  Recent admission 8/17 s/p Fall with new subacute infarct of the right occipital lobe with edema and mass effect   Presents to ED on 8/29 with nausea/vomiting and upper abdominal pain. CT A/P with thickened edematous distal esophagus concerning for esophagitis. Remaining exam negative and hemodynamically stable. Discharged back to SNF. On 8/30 at 0300 patient was noted to be obtunded, febrile, and tachycardiac. Sent back to ED. On arrival patient noted to be hyperglycemic with glucose >700, ketones in urine. Given 2 amps of Bicarb. Upon assessment patient minimally responsive and vomiting dark emesis, became hypoxic and was emergently intubated. LA 9. WBC 34.0. Temp 101. PCCM asked to admit   Subjective    Assessment & Plan:   Respiratory Insufficieny secondary to acute encephalopathy  Plan  -Vent Support -Trend CXR/ABG > Repeat ABG post intubation  -Pulmonary Hygiene -VAP Bundle   Suspected Septic vs Hypovolemic Shock  H/O HTN, Aortic Valve Replacement, CAD, PAD   Plan  -Cardiac Monitoring  -Maintain MAP >65  -Trend Troponin  -Hold Coumadin/Plavix   Anion Gap Metabolic Acidosis in setting of DKA/Lactic Acidosis  Pseudo Hyperkalemia  Acute Kidney Injury  Plan  -Trend BMP  -Trend LA  -DKA treatment as below  -S/P 2 L NS, Continue NS @ 125 ml/hr   Concern for Esophagitis  Hematemesis Plan  -Trend CBC -Hold anticoagulation -PPI BID  -Consult GI this AM   -INR pending   DKA  H/O DM  Plan  -Trend Glucose  -Give 10 units IV insulin push now  -Insulin gtt with DKA protocol  -Beta-H  pending      Leukocytosis, Febrile  Recent E.Coli UTI  Plan  -Trend WBC and Fever Curve -Trend PCT and LA  -PAN Culture -Continue Vancomycin, Flagyl, Cefepime   Acute Metabolic Encephalopathy H/O Depression, CVA with residual deficits, recent right occipital lobe infarct  Plan  -RASS goal 0/-1 -Titrate Fentanyl gtt to achieve RASS -CT Head pending  -Hold Lexapro and Trazodone   Best Practice / Goals of Care / Disposition.   DVT prophylaxis: On Hold due to Bleeding  GI prophylaxis: PPI BID  Diet: NPO Mobility:Bedrest  Code Status: Full Code  Family Communication: No family at bedside   Disposition / Summary of Today's Plan 08-04-2018   57 year old female presents to ED with N/V. Found to be in DKA. Noted to have Dark emesis and progressive lethargy. Intubated for airway protection.   Consultants: date of consult/date signed off (if applicable)/final recs  8/30 > PCCM Admitted   Procedures: ETT 8/30 >> Right IJ 8/30 >>  Significant Diagnostic Tests: CT A/P 8/29 > Similarly thickened edematous distal esophagus, differential diagnosis includes recurrent esophagitis or mass. Recommend Endoscopy. No acute intra-abdominal or pelvic process. Subacute appearing moderate T11 compression fracture CXR 8/30 > The lungs are clear. There is no pleural effusion or pneumothorax. Mild cardiomegaly. Median sternotomy wires and mechanical cardiac valve. No acute osseous pathology. CT Head 8/30 >>   Micro Data: Blood 8/30 >> U/A 8/30 >>   Antimicrobials:  Vancomycin 8/30 >> Flagyl 8/30 >> Cefepime 8/30 >>  Objective    Examination: General: Adult female, on vent  HENT: ETT in place  Lungs: Rhonchi throughout, no crackles/wheeze  Cardiovascular: Tachy, no MRG  Abdomen: non-distended, soft, active bowel sounds  Extremities: -edema  Neuro: sedated, does not follow commands  GU: foley in place   Blood pressure (!) 118/58, pulse (!) 144, temperature (!) 101 F (38.3 C),  temperature source Rectal, resp. rate (!) 24, height 5\' 2"  (1.575 m), weight 80 kg, SpO2 95 %.    Vent Mode: PRVC FiO2 (%):  [60 %] 60 % Set Rate:  [24 bmp] 24 bmp Vt Set:  [400 mL] 400 mL PEEP:  [5 cmH20] 5 cmH20 Plateau Pressure:  [22 cmH20] 22 cmH20   Intake/Output Summary (Last 24 hours) at August 17, 2018 0618 Last data filed at 08-17-2018 1610 Gross per 24 hour  Intake 100 ml  Output -  Net 100 ml   Filed Weights   Aug 17, 2018 0340  Weight: 80 kg     Labs    CBC: Recent Labs  Lab 07/17/18 1727 08/17/18 0342  WBC 18.1* 34.0*  NEUTROABS  --  PENDING  HGB 12.9 12.0  HCT 38.2 43.0  MCV 87.8 103.1*  PLT 499* 538*   Basic Metabolic Panel: Recent Labs  Lab 07/17/18 1727  NA 137  K 4.7  CL 95*  CO2 20*  GLUCOSE 360*  BUN 27*  CREATININE 1.54*  CALCIUM 9.5   GFR: Estimated Creatinine Clearance: 39.5 mL/min (A) (by C-G formula based on SCr of 1.54 mg/dL (H)). Recent Labs  Lab 07/17/18 1727 17-Aug-2018 0342 August 17, 2018 0439  WBC 18.1* 34.0*  --   LATICACIDVEN  --   --  9.78*   Liver Function Tests: Recent Labs  Lab 07/17/18 1727  AST 18  ALT 15  ALKPHOS 108  BILITOT 1.2  PROT 7.5  ALBUMIN 3.6   Recent Labs  Lab 07/17/18 1727  LIPASE 18   No results for input(s): AMMONIA in the last 168 hours. ABG    Component Value Date/Time   PHART 7.345 (L) 08/17/2018 0524   PCO2ART 17.3 (LL) 2018-08-17 0524   PO2ART 208.0 (H) 08/17/2018 0524   HCO3 9.4 (L) 08/17/18 0524   TCO2 10 (L) 2018/08/17 0524   ACIDBASEDEF 14.0 (H) 17-Aug-2018 0524   O2SAT 100.0 08/17/2018 0524    Coagulation Profile: Recent Labs  Lab 08-17-2018 0342  INR 2.78   Cardiac Enzymes: No results for input(s): CKTOTAL, CKMB, CKMBINDEX, TROPONINI in the last 168 hours. HbA1C: Hemoglobin A1C  Date/Time Value Ref Range Status  10/01/2017 10:25 AM 9.8  Final  07/01/2017 02:33 PM 9.0  Final   Hgb A1c MFr Bld  Date/Time Value Ref Range Status  07/06/2018 03:29 AM 8.5 (H) 4.8 - 5.6 % Final      Comment:    (NOTE) Pre diabetes:          5.7%-6.4% Diabetes:              >6.4% Glycemic control for   <7.0% adults with diabetes   10/22/2016 03:17 AM 13.0 (H) 4.8 - 5.6 % Final    Comment:    (NOTE)         Pre-diabetes: 5.7 - 6.4         Diabetes: >6.4         Glycemic control for adults with diabetes: <7.0    CBG: No results for input(s): GLUCAP in the last 168 hours.   Review of Systems:   Unable to review as  patient is intubated   Past medical history  She,  has a past medical history of Allergy, Anxiety, Aortic stenosis, CAD (coronary artery disease), CHF (congestive heart failure) (HCC), Depression, Diabetes (HCC) (dx'd 1982), Dyslipidemia, GERD (gastroesophageal reflux disease), Heart murmur, HTN (hypertension), Hypercholesteremia, Myocardial infarction (HCC) (12/2014), Obesity, Pneumonia (11/2014; 12/2014), Rheumatoid arthritis(714.0), Stroke syndrome (1995; 2001), and Thrombophlebitis.   Surgical History    Past Surgical History:  Procedure Laterality Date  . AORTIC VALVE REPLACEMENT (AVR)/CORONARY ARTERY BYPASS GRAFTING (CABG)  "2014"   [name] 03/08/2014  . CARDIAC CATHETERIZATION  12/08/2014  . CATARACT EXTRACTION Left   . CESAREAN SECTION  1995  . CORONARY ANGIOPLASTY WITH STENT PLACEMENT  12/09/2014  . CORONARY ARTERY BYPASS GRAFT    . FOOT FRACTURE SURGERY    . FRACTURE SURGERY    . INTRAMEDULLARY (IM) NAIL INTERTROCHANTERIC Left 11/30/2016   Procedure: INTRAMEDULLARY (IM) NAIL LEFT HIP;  Surgeon: Yolonda Kida, MD;  Location: [facility] OR;  Service: Orthopedics;  Laterality: Left;  . KNEE ARTHROSCOPY Right   . LEFT HEART CATHETERIZATION WITH CORONARY ANGIOGRAM N/A 12/08/2014   Procedure: LEFT HEART CATHETERIZATION WITH CORONARY ANGIOGRAM;  Surgeon: Iran Ouch, MD;  Location: [facility] CATH LAB;  Service: Cardiovascular;  Laterality: N/A;  . LOWER EXTREMITY ANGIOGRAPHY N/A 05/08/2018   Procedure: LOWER EXTREMITY ANGIOGRAPHY;  Surgeon: Fransisco Hertz, MD;   Location[facility] INVASIVE CV LAB;  Service: Cardiovascular;  Laterality: N/A;  . LOWER EXTREMITY ANGIOGRAPHY N/A 06/05/2018   Procedure: LOWER EXTREMITY ANGIOGRAPHY - Right Leg;  Surgeon: Fransisco Hertz, MD;  Location: [facility] INVASIVE CV LAB;  Service: Cardiovascular;  Laterality: N/A;  . NEUROPLASTY / TRANSPOSITION MEDIAN NERVE AT CARPAL TUNNEL    . ORIF ANKLE FRACTURE Left 07/04/2015   Procedure: OPEN REDUCTION INTERNAL FIXATION (ORIF)  TRIMAL ANKLE FRACTURE;  Surgeon: [name], MD;  Location: [facility] OR;  Service: Orthopedics;  Laterality: Left;  . PERCUTANEOUS CORONARY STENT INTERVENTION (PCI-S) N/A 12/09/2014   Procedure: PERCUTANEOUS CORONARY STENT INTERVENTION (PCI-S);  Surgeon: [name], MD;  Location: [facility] CATH LAB;  Service: Cardiovascular;  Laterality: N/A;  . PERIPHERAL VASCULAR BALLOON ANGIOPLASTY Left 05/08/2018   Procedure: PERIPHERAL VASCULAR BALLOON ANGIOPLASTY;  Surgeon: Fransisco Hertz, MD;  Location: [facility] INVASIVE CV LAB;  Service: Cardiovascular;  Laterality: Left;  Left anterior tibial, and tibioperoneal trunk  . PERIPHERAL VASCULAR INTERVENTION Left 05/08/2018   Procedure: PERIPHERAL VASCULAR INTERVENTION;  Surgeon: Fransisco Hertz, MD;  Location: [facility] INVASIVE CV LAB;  Service: Cardiovascular;  Laterality: Left;  Popliteal artery  . PERIPHERAL VASCULAR INTERVENTION Right 06/05/2018   Procedure: PERIPHERAL VASCULAR INTERVENTION;  Surgeon: Fransisco Hertz, MD;  Location: [facility] INVASIVE CV LAB;  Service: Cardiovascular;  Laterality: Right;  . TUBAL LIGATION  1995     Social History   Social History   Socioeconomic History  . Marital status: Divorced    Spouse name: Not on file  . Number of children: Not on file  . Years of education: Not on file  . Highest education level: Not on file  Occupational History  . Not on file  Social Needs  . Financial resource strain: Not on file  . Food insecurity:    Worry: Not on file    Inability: Not on file  . Transportation needs:    Medical: Not  on file    Non-medical: Not on file  Tobacco Use  . Smoking status: Never Smoker  . Smokeless tobacco: Never Used  Substance and Sexual Activity  .  Alcohol use: No    Alcohol/week: 0.0 standard drinks  . Drug use: No  . Sexual activity: Never  Lifestyle  . Physical activity:    Days per week: Not on file    Minutes per session: Not on file  . Stress: Not on file  Relationships  . Social connections:    Talks on phone: Not on file    Gets together: Not on file    Attends religious service: Not on file    Active member of club or organization: Not on file    Attends meetings of clubs or organizations: Not on file    Relationship status: Not on file  . Intimate partner violence:    Fear of current or ex partner: Not on file    Emotionally abused: Not on file    Physically abused: Not on file    Forced sexual activity: Not on file  Other Topics Concern  . Not on file  Social History Narrative   Divroced and lives at home.    Has two sons who live in [place] at this time. (21&8 y/o).    Has a cat    Walks a lot.       She is having great success with her diet       Working with exercises that were given to her by PT/OT.   ,  reports that she has never smoked. She has never used smokeless tobacco. She reports that she does not drink alcohol or use drugs.   Family history   Her family history includes Heart disease in her mother and sister; Stroke in her unknown relative. There is no history of Breast cancer.   Allergies Allergies  Allergen Reactions  . Tape Other (See Comments)    Can burn skin if left on too long. Reaction:Burning  Can burn skin if left on too long.  . Celebrex [Celecoxib] Rash  . Detrol [Tolterodine] Hives    Home meds  Prior to Admission medications   Medication Sig Start Date End Date Taking? Authorizing Provider  acetaminophen (TYLENOL) 325 MG tablet Take 2 tablets (650 mg total) by mouth every 6 (six) hours as needed for mild pain (or Fever >/=  101). 12/04/16   Zannie Cove, MD  calcitonin, salmon, (MIACALCIN/FORTICAL) 200 UNIT/ACT nasal spray Place 1 spray into alternate nostrils daily. (0900)    [provider]  CALCIUM PO Take 600 mg by mouth daily.    [provider]  clopidogrel (PLAVIX) 75 MG tablet Take 1 tablet (75 mg total) by mouth daily. 05/08/18 05/08/19  Fransisco Hertz, MD  ergocalciferol (VITAMIN D2) 50000 units capsule Take 50,000 Units by mouth every 30 (thirty) days.     [provider]  escitalopram (LEXAPRO) 20 MG tablet Take 20 mg by mouth daily. (0900)    [provider]  ferrous sulfate 325 (65 FE) MG tablet Take 325 mg by mouth daily with breakfast. (0800)    [provider]  gabapentin (NEURONTIN) 400 MG capsule Take 400 mg by mouth 3 (three) times daily. (0900, 1300, 1700)    [provider]  GLUCAGEN HYPOKIT 1 MG SOLR injection Inject 1 mg into the muscle once as needed for low blood sugar. CBG < 50 07/09/18   [provider]  glucagon (GLUCAGON EMERGENCY) 1 MG injection Inject 1 mg into the muscle every 15 (fifteen) minutes as needed (for hypoglycemia for CBG<50).    [provider]  HYDROcodone-acetaminophen (NORCO/VICODIN) 5-325 MG tablet  Take 1 tablet by mouth 3 (three) times daily as needed (for pain.). Patient taking differently: Take 1 tablet by mouth every 8 (eight) hours as needed (for pain.).  07/09/18   Albertine Grates, MD  ibandronate (BONIVA) 150 MG tablet Take 150 mg by mouth every 30 (thirty) days. Take in the morning with a full glass of water, on an empty stomach, and do not take anything else by mouth or lie down for the next 30 min.    [provider]  insulin aspart (NOVOLOG FLEXPEN) 100 UNIT/ML FlexPen Inject 13 Units into the skin 3 (three) times daily with meals.    [provider]  insulin aspart (NOVOLOG) 100 UNIT/ML injection Inject 13 Units into the skin 3 (three) times daily before meals. Patient not taking:  Reported on 07/17/2018 07/01/17   Romero Belling, MD  insulin detemir (LEVEMIR) 100 UNIT/ML injection Inject 0.2 mLs (20 Units total) into the skin at bedtime. Patient not taking: Reported on 07/17/2018 07/01/17   Romero Belling, MD  lisinopril (PRINIVIL,ZESTRIL) 2.5 MG tablet Take 1 tablet (2.5 mg total) by mouth every Monday, Wednesday, and Friday. (0900) Hold if sbp less than 90 07/09/18   Albertine Grates, MD  loratadine (CLARITIN) 10 MG tablet Take 10 mg by mouth daily. (0900)    [provider]  Melatonin 3 MG TABS Take 6 mg by mouth at bedtime. (2100)    [provider]  ondansetron (ZOFRAN ODT) 8 MG disintegrating tablet Take 1 tablet (8 mg total) by mouth every 8 (eight) hours as needed for nausea or vomiting. 07/17/18   Azalia Bilis, MD  ondansetron (ZOFRAN) 4 MG tablet Take 4 mg by mouth every 6 (six) hours as needed for nausea or vomiting.    [provider]  polyethylene glycol (MIRALAX / GLYCOLAX) packet Take 17 g by mouth daily. 07/09/18   Albertine Grates, MD  pravastatin (PRAVACHOL) 40 MG tablet Take 40 mg by mouth daily at 6 PM. (1700)    [provider]  promethazine (PHENERGAN) 25 MG suppository Place 25 mg rectally every 6 (six) hours as needed for nausea or vomiting.    [provider]  senna-docusate (SENOKOT-S) 8.6-50 MG tablet Take 1 tablet by mouth at bedtime. Patient not taking: Reported on 07/17/2018 07/09/18   Albertine Grates, MD  solifenacin (VESICARE) 10 MG tablet Take 5 mg by mouth daily.  07/09/18   [provider]  traZODone (DESYREL) 50 MG tablet Take 50 mg by mouth at bedtime. (2000)    [provider]  vitamin C (ASCORBIC ACID) 500 MG tablet Take 500 mg by mouth daily. (0900)    [provider]     CC Time: 64 minutes   Jovita Kussmaul, AGACNP-BC [facility]  Pgr: [phone]  PCCM Pgr: [phone]

## 2018-07-18 NOTE — Progress Notes (Signed)
Madison [name]  KGM:[id number] DOB: 06-16-1961 DOA: 06/19/2018 PCP: Renford Dills, MD    Reason for Consult / Chief Complaint:  Respiratory Distress/sepsis  Consulting MD:  Dr. Bebe Shaggy 8/30  HPI/Brief Narrative   57 year old female with PMH of aortic valve replacement on Coumadin, Depression, HTN, DM, CVA with residual deficits  Recent admission 8/17 s/p Fall with new subacute infarct of the right occipital lobe with edema and mass effect   Presents to ED on 8/29 with nausea/vomiting and upper abdominal pain. CT A/P with thickened edematous distal esophagus concerning for esophagitis. Remaining exam negative and hemodynamically stable. Discharged back to [facility]. On 8/30 at 0300 patient was noted to be obtunded, febrile, and tachycardiac. Sent back to ED. On arrival patient noted to be hyperglycemic with glucose >700, ketones in urine. Given 2 amps of Bicarb. Upon assessment patient minimally responsive and vomiting dark emesis, became hypoxic and was emergently intubated. LA 9. WBC 34.0. Temp 101. PCCM asked to admit   Subjective  Patient sedated and not responding to stimuli. No family at bedside and don't believe they have been contacted.   Assessment & Plan:   Respiratory Insufficiency Secondary to Acute Encephalopathy: PVRC 24 400 40% 5. No pressor support currently. Sedated with fent.  - cont. Vent support  - repeat ABG - daily CXR, ABG - goal MAP >65  Septic Shock vs. Hypovolemic Shock secondary to GI bleed: INR 2.78 Hx Recent E. Coli UTI - holding anticoagulation  - 2mg  vit K - cont. flagyl, vanc, cefepime  - GI consulted - PAN cultures pending - daily CBC and CBC now - PPI bid   Anion Gap Metabolic Acidosis secondary to DKA/Lactic Acidosis: s/p 2L LR, 2U insulin push   - cont. NS @ 125cc/hr - trend BMP, LA  - insulin gtt, D5W w/glucose <250 - BMP q4h  Acute Metabolic Encephalopathy H/O Depression, CVA with residual deficits, recent right occipital lobe infarct   CT Head with no acute injury   - RASS goal 0/-1 - titrate fentanyl gtt to achieve RASS   Best Practice / Goals of Care / Disposition.   DVT prophylaxis: SCDs GI prophylaxis: PO propanozole 40mg  qd  Diet: NPO Mobility: bedrest  Code Status: Full Family Communication: Family not at bedside today. Will contact sister.   Disposition / Summary of Today's Plan 07/16/2018     Consultants:  Gastroenterology 8/30  Procedures: 8/30 CVC >>  Significant Diagnostic Tests:  8/30 CT head: previous infarcts of b/l PCA, MCA region with no acute abnormalities   Micro Data: Urine Cx 8/17 >> E. Coli  Urine Cx 8/30 >> pending Blood Cxx2 >>pending   Antimicrobials:  Cefepime 8/30 >> Vanc 8/30 >>  Flagyl 8/30 >>    Objective    Examination: General: supine in bed, no sedation, does not respond to stimuli HEENT: pupils equal but not reactive, OG/ET tube in place, coffee ground emesis from OG tube Lungs: CTAB, no w/r/r Cardiovascular: tachycardic, no m/r/g Abdomen: hypoactive BS, non-distended, soft  Extremities: warm, +pulses, no pitting edema  Neuro: does not respond to stimuli, not sedated   Blood pressure 103/72, pulse (!) 123, temperature 98.1 F (36.7 C), resp. rate (!) 34, height 5\' 2"  (1.575 m), weight 80 kg, SpO2 97 %. . sodium chloride    . ceFEPime (MAXIPIME) IV    . dextrose 5 % and 0.45% NaCl    . fentaNYL infusion INTRAVENOUS 50 mcg/hr (07/12/2018 6440)  . insulin (NOVOLIN-R) infusion 4.9 Units/hr (  06/24/2018 0810)  . lactated ringers    . metronidazole 500 mg (06/24/2018 0517)  . [START ON 07/19/2018] vancomycin        Vent Mode: PRVC FiO2 (%):  [40 %-60 %] 40 % Set Rate:  [24 bmp] 24 bmp Vt Set:  [400 mL] 400 mL PEEP:  [5 cmH20] 5 cmH20 Plateau Pressure:  [18 cmH20-22 cmH20] 18 cmH20   Intake/Output Summary (Last 24 hours) at 06/21/2018 0841 Last data filed at 06/27/2018 0631 Gross per 24 hour  Intake 100 ml  Output 900 ml  Net -800 ml   Filed Weights    07/17/2018 0340  Weight: 80 kg     Labs   CBC: Recent Labs  Lab 07/17/18 1727 06/27/2018 0342 07/16/2018 0611  WBC 18.1* 34.0*  --   NEUTROABS  --  28.9*  --   HGB 12.9 12.0 11.9*  HCT 38.2 43.0 35.0*  MCV 87.8 103.1*  --   PLT 499* 538*  --    Basic Metabolic Panel: Recent Labs  Lab 07/17/18 1727 06/22/2018 0342 07/03/2018 0611 06/28/2018 0711  NA 137 136 139 140  K 4.7 6.0* 4.7 4.2  CL 95* 96* 106 102  CO2 20* <7*  --  15*  GLUCOSE 360* 913* >700* 715*  BUN 27* 29* 36* 25*  CREATININE 1.54* 2.38* 1.30* 2.09*  CALCIUM 9.5 9.0  --  8.2*   GFR: Estimated Creatinine Clearance: 29.1 mL/min (A) (by C-G formula based on SCr of 2.09 mg/dL (H)). Recent Labs  Lab 07/17/18 1727 06/22/2018 0342 07/16/2018 0439 07/15/2018 0554 07/04/2018 0609  PROCALCITON  --   --   --  5.61  --   WBC 18.1* 34.0*  --   --   --   LATICACIDVEN  --   --  9.78*  --  6.9*   Liver Function Tests: Recent Labs  Lab 07/17/18 1727 07/06/2018 0342  AST 18 27  ALT 15 10  ALKPHOS 108 111  BILITOT 1.2 2.3*  PROT 7.5 6.9  ALBUMIN 3.6 3.4*   Recent Labs  Lab 07/17/18 1727  LIPASE 18   No results for input(s): AMMONIA in the last 168 hours. ABG    Component Value Date/Time   PHART 7.318 (L) 06/28/2018 0805   PCO2ART 31.7 (L) 07/11/2018 0805   PO2ART 78.0 (L) 07/11/2018 0805   HCO3 15.8 (L) 07/13/2018 0805   TCO2 12 (L) 07/12/2018 0611   ACIDBASEDEF 9.1 (H) 07/14/2018 0805   O2SAT 95.1 06/20/2018 0805    Coagulation Profile: Recent Labs  Lab 06/20/2018 0342  INR 2.78   Cardiac Enzymes: No results for input(s): CKTOTAL, CKMB, CKMBINDEX, TROPONINI in the last 168 hours. HbA1C: Hemoglobin A1C  Date/Time Value Ref Range Status  10/01/2017 10:25 AM 9.8  Final  07/01/2017 02:33 PM 9.0  Final   Hgb A1c MFr Bld  Date/Time Value Ref Range Status  07/06/2018 03:29 AM 8.5 (H) 4.8 - 5.6 % Final    Comment:    (NOTE) Pre diabetes:          5.7%-6.4% Diabetes:              >6.4% Glycemic control for    <7.0% adults with diabetes   10/22/2016 03:17 AM 13.0 (H) 4.8 - 5.6 % Final    Comment:    (NOTE)         Pre-diabetes: 5.7 - 6.4         Diabetes: >6.4  Glycemic control for adults with diabetes: <7.0    CBG: Recent Labs  Lab 07/03/2018 0717 07/08/2018 0808  GLUCAP >600* 552*     Review of Systems:     Past medical history   Past Medical History:  Diagnosis Date  . Allergy   . Anxiety   . Aortic stenosis   . CAD (coronary artery disease)   . CHF (congestive heart failure) (HCC)   . Depression   . Diabetes (HCC) dx'd 1982   "went straight to insulin"  . Dyslipidemia   . GERD (gastroesophageal reflux disease)   . Heart murmur   . HTN (hypertension)   . Hypercholesteremia   . Myocardial infarction (HCC) 12/2014  . Obesity   . Pneumonia 11/2014; 12/2014  . Rheumatoid arthritis(714.0)    "hands" (07/01/2015)  . Stroke syndrome 1995; 2001   "when my son was born; problems w/speech and L hand since then" (01/19/2015)  . Thrombophlebitis    Social History   Social History   Socioeconomic History  . Marital status: Divorced    Spouse name: Not on file  . Number of children: Not on file  . Years of education: Not on file  . Highest education level: Not on file  Occupational History  . Not on file  Social Needs  . Financial resource strain: Not on file  . Food insecurity:    Worry: Not on file    Inability: Not on file  . Transportation needs:    Medical: Not on file    Non-medical: Not on file  Tobacco Use  . Smoking status: Never Smoker  . Smokeless tobacco: Never Used  Substance and Sexual Activity  . Alcohol use: No    Alcohol/week: 0.0 standard drinks  . Drug use: No  . Sexual activity: Never  Lifestyle  . Physical activity:    Days per week: Not on file    Minutes per session: Not on file  . Stress: Not on file  Relationships  . Social connections:    Talks on phone: Not on file    Gets together: Not on file    Attends religious service:  Not on file    Active member of club or organization: Not on file    Attends meetings of clubs or organizations: Not on file    Relationship status: Not on file  . Intimate partner violence:    Fear of current or ex partner: Not on file    Emotionally abused: Not on file    Physically abused: Not on file    Forced sexual activity: Not on file  Other Topics Concern  . Not on file  Social History Narrative   Divroced and lives at home.    Has two sons who live in [place] at this time. (21&8 y/o).    Has a cat    Walks a lot.       She is having great success with her diet       Working with exercises that were given to her by PT/OT.     Family history    Family History  Problem Relation Age of Onset  . Heart disease Mother   . Heart disease Sister   . Stroke Unknown   . Breast cancer Neg Hx     Allergies Allergies  Allergen Reactions  . Tape Other (See Comments)    Can burn skin if left on too long. Reaction:Burning  Can burn skin if left on too long.  Marland Kitchen  Celebrex [Celecoxib] Rash  . Detrol [Tolterodine] Hives    Home meds  Prior to Admission medications   Medication Sig Start Date End Date Taking? Authorizing Provider  acetaminophen (TYLENOL) 325 MG tablet Take 2 tablets (650 mg total) by mouth every 6 (six) hours as needed for mild pain (or Fever >/= 101). 12/04/16   Zannie Cove, MD  calcitonin, salmon, (MIACALCIN/FORTICAL) 200 UNIT/ACT nasal spray Place 1 spray into alternate nostrils daily. (0900)    [provider]  CALCIUM PO Take 600 mg by mouth daily.    [provider]  clopidogrel (PLAVIX) 75 MG tablet Take 1 tablet (75 mg total) by mouth daily. 05/08/18 05/08/19  Fransisco Hertz, MD  ergocalciferol (VITAMIN D2) 50000 units capsule Take 50,000 Units by mouth every 30 (thirty) days.     [provider]  escitalopram (LEXAPRO) 20 MG tablet Take 20 mg by mouth daily. (0900)    [provider]  ferrous sulfate 325 (65 FE) MG tablet  Take 325 mg by mouth daily with breakfast. (0800)    [provider]  gabapentin (NEURONTIN) 400 MG capsule Take 400 mg by mouth 3 (three) times daily. (0900, 1300, 1700)    [provider]  GLUCAGEN HYPOKIT 1 MG SOLR injection Inject 1 mg into the muscle once as needed for low blood sugar. CBG < 50 07/09/18   [provider]  glucagon (GLUCAGON EMERGENCY) 1 MG injection Inject 1 mg into the muscle every 15 (fifteen) minutes as needed (for hypoglycemia for CBG<50).    [provider]  HYDROcodone-acetaminophen (NORCO/VICODIN) 5-325 MG tablet Take 1 tablet by mouth 3 (three) times daily as needed (for pain.). Patient taking differently: Take 1 tablet by mouth every 8 (eight) hours as needed (for pain.).  07/09/18   Albertine Grates, MD  ibandronate (BONIVA) 150 MG tablet Take 150 mg by mouth every 30 (thirty) days. Take in the morning with a full glass of water, on an empty stomach, and do not take anything else by mouth or lie down for the next 30 min.    [provider]  insulin aspart (NOVOLOG FLEXPEN) 100 UNIT/ML FlexPen Inject 13 Units into the skin 3 (three) times daily with meals.    [provider]  insulin aspart (NOVOLOG) 100 UNIT/ML injection Inject 13 Units into the skin 3 (three) times daily before meals. Patient not taking: Reported on 07/17/2018 07/01/17   Romero Belling, MD  insulin detemir (LEVEMIR) 100 UNIT/ML injection Inject 0.2 mLs (20 Units total) into the skin at bedtime. Patient not taking: Reported on 07/17/2018 07/01/17   Romero Belling, MD  lisinopril (PRINIVIL,ZESTRIL) 2.5 MG tablet Take 1 tablet (2.5 mg total) by mouth every Monday, Wednesday, and Friday. (0900) Hold if sbp less than 90 07/09/18   Albertine Grates, MD  loratadine (CLARITIN) 10 MG tablet Take 10 mg by mouth daily. (0900)    [provider]  Melatonin 3 MG TABS Take 6 mg by mouth at bedtime. (2100)    [provider]  ondansetron (ZOFRAN ODT) 8 MG disintegrating  tablet Take 1 tablet (8 mg total) by mouth every 8 (eight) hours as needed for nausea or vomiting. 07/17/18   Azalia Bilis, MD  ondansetron (ZOFRAN) 4 MG tablet Take 4 mg by mouth every 6 (six) hours as needed for nausea or vomiting.    [provider]  polyethylene glycol (MIRALAX / GLYCOLAX) packet Take 17 g by mouth daily. 07/09/18   Albertine Grates, MD  pravastatin (  PRAVACHOL) 40 MG tablet Take 40 mg by mouth daily at 6 PM. (1700)    [provider]  promethazine (PHENERGAN) 25 MG suppository Place 25 mg rectally every 6 (six) hours as needed for nausea or vomiting.    [provider]  senna-docusate (SENOKOT-S) 8.6-50 MG tablet Take 1 tablet by mouth at bedtime. Patient not taking: Reported on 07/17/2018 07/09/18   Albertine Grates, MD  solifenacin (VESICARE) 10 MG tablet Take 5 mg by mouth daily.  07/09/18   [provider]  traZODone (DESYREL) 50 MG tablet Take 50 mg by mouth at bedtime. (2000)    [provider]  vitamin C (ASCORBIC ACID) 500 MG tablet Take 500 mg by mouth daily. (0900)    [provider]     LOS: 0 days

## 2018-07-18 NOTE — Consult Note (Addendum)
Referring Provider: [facility]  Primary Care Physician:  Seward Carol, MD Primary Gastroenterologist:  Zenovia Jarred, MD  Reason for Consultation:  hematemesis    ASSESSMENT AND PLAN:    53. 57 yo female with multiple medical problems not limited to AVR, on coumadin, CVA on  Plavix, HTN, uncontrolled diabetes. She presents in DKA with N/V, hematemesis / mental status changes.  History comes from EMR. Overall this seems to be a low volume upper GI bleed at this point.  -In ED last night with N/V, no reports of hematemesis then. Suspect this is a low volume upper GI bleed and probably secondary to erosive esophagitis vrs Mallory Weiss tear. Possibly PUD but based on CT scan findings favor the former two.  -agree with BID IV PPI -trend H+H, transfuse if needed.  -No plan for EGD unless bleeding persists / escalates.   2. DKA / metabolic acidosis.   3. Acute respiratory failure. Intubated due to worsening mental status / encephalopathy.   4. Shock physiology: Suspect septic shock rather than hemorrhagic shock given volume of output and overall clinical presentation. Cultures pending. On IV antibiotics.    HPI: Madison [name] is a 57 y.o. female with multiple medical problems not limited to AVR on coumadin, HTN, DM and multiple CVAs (most recent one this month). She also takes Plavix.  She was in ED last night with nausea, vomiting, abdominal pain but was discharged back to SNF. Brought back to ED today with DKA, altered mental status, hematemesis. She required intubation / mechanical ventilation. She is partially alert, still intubated on vent and unable to provide any history. . CT scan with contrast suggests thickened / edema of distal esophagus. She apparently wasn't taking any PPIs / H2 blockers at home. Hgb is stable and around baseline.     Past Medical History:  Diagnosis Date  . Allergy   . Anxiety   . Aortic stenosis   . CAD (coronary artery disease)   . CHF (congestive  heart failure) ([place])   . Depression   . Diabetes ([place]) dx'd 1982   "went straight to insulin"  . Dyslipidemia   . GERD (gastroesophageal reflux disease)   . Heart murmur   . HTN (hypertension)   . Hypercholesteremia   . Myocardial infarction ([place]) 12/2014  . Obesity   . Pneumonia 11/2014; 12/2014  . Rheumatoid arthritis(714.0)    "hands" (07/01/2015)  . Stroke syndrome 1995; 2001   "when my son was born; problems w/speech and L hand since then" (01/19/2015)  . Thrombophlebitis     Past Surgical History:  Procedure Laterality Date  . AORTIC VALVE REPLACEMENT (AVR)/CORONARY ARTERY BYPASS GRAFTING (CABG)  "2014"   Archie Endo 03/08/2014  . CARDIAC CATHETERIZATION  12/08/2014  . CATARACT EXTRACTION Left   . CESAREAN SECTION  1995  . CORONARY ANGIOPLASTY WITH STENT PLACEMENT  12/09/2014  . CORONARY ARTERY BYPASS GRAFT    . FOOT FRACTURE SURGERY    . FRACTURE SURGERY    . INTRAMEDULLARY (IM) NAIL INTERTROCHANTERIC Left 11/30/2016   Procedure: INTRAMEDULLARY (IM) NAIL LEFT HIP;  Surgeon: Nicholes Stairs, MD;  Location: [place] [facility];  Service: Orthopedics;  Laterality: Left;  . KNEE ARTHROSCOPY Right   . LEFT HEART CATHETERIZATION WITH CORONARY ANGIOGRAM N/A 12/08/2014   Procedure: LEFT HEART CATHETERIZATION WITH CORONARY ANGIOGRAM;  Surgeon: Wellington Hampshire, MD;  Location: [facility] CATH LAB;  Service: Cardiovascular;  Laterality: N/A;  . LOWER EXTREMITY ANGIOGRAPHY N/A 05/08/2018   Procedure: LOWER EXTREMITY ANGIOGRAPHY;  Surgeon: Conrad Magnolia, MD;  Location: [place] [facility];  Service: Cardiovascular;  Laterality: N/A;  . LOWER EXTREMITY ANGIOGRAPHY N/A 06/05/2018   Procedure: LOWER EXTREMITY ANGIOGRAPHY - Right Leg;  Surgeon: Conrad Vieques, MD;  Location: [facility];  Service: Cardiovascular;  Laterality: N/A;  . NEUROPLASTY / TRANSPOSITION MEDIAN NERVE AT CARPAL TUNNEL    . ORIF ANKLE FRACTURE Left 07/04/2015   Procedure: OPEN REDUCTION INTERNAL FIXATION (ORIF)  TRIMAL ANKLE FRACTURE;  Surgeon:  Leandrew Koyanagi, MD;  Location: [facility];  Service: Orthopedics;  Laterality: Left;  . PERCUTANEOUS CORONARY STENT INTERVENTION (PCI-S) N/A 12/09/2014   Procedure: PERCUTANEOUS CORONARY STENT INTERVENTION (PCI-S);  Surgeon: Leonie Man, MD;  Location: [facility];  Service: Cardiovascular;  Laterality: N/A;  . PERIPHERAL VASCULAR BALLOON ANGIOPLASTY Left 05/08/2018   Procedure: PERIPHERAL VASCULAR BALLOON ANGIOPLASTY;  Surgeon: Conrad Ekron, MD;  Location: [place] [facility];  Service: Cardiovascular;  Laterality: Left;  Left anterior tibial, and tibioperoneal trunk  . PERIPHERAL VASCULAR INTERVENTION Left 05/08/2018   Procedure: PERIPHERAL VASCULAR INTERVENTION;  Surgeon: Conrad Osprey, MD;  Location: [place] [facility];  Service: Cardiovascular;  Laterality: Left;  Popliteal artery  . PERIPHERAL VASCULAR INTERVENTION Right 06/05/2018   Procedure: PERIPHERAL VASCULAR INTERVENTION;  Surgeon: Conrad Rolla, MD;  Location: [facility];  Service: Cardiovascular;  Laterality: Right;  . TUBAL LIGATION  1995    Prior to Admission medications   Medication Sig Start Date End Date Taking? Authorizing Provider  acetaminophen (TYLENOL) 325 MG tablet Take 2 tablets (650 mg total) by mouth every 6 (six) hours as needed for mild pain (or Fever >/= 101). 12/04/16   Domenic Polite, MD  calcitonin, salmon, (MIACALCIN/FORTICAL) 200 UNIT/ACT nasal spray Place 1 spray into alternate nostrils daily. (0900)    [provider]  CALCIUM PO Take 600 mg by mouth daily.    [provider]  clopidogrel (PLAVIX) 75 MG tablet Take 1 tablet (75 mg total) by mouth daily. 05/08/18 05/08/19  Conrad Sebastopol, MD  ergocalciferol (VITAMIN D2) 50000 units capsule Take 50,000 Units by mouth every 30 (thirty) days.     [provider]  escitalopram (LEXAPRO) 20 MG tablet Take 20 mg by mouth daily. (0900)    [provider]  ferrous sulfate 325 (65 FE) MG tablet Take 325 mg by mouth daily with  breakfast. (0800)    [provider]  gabapentin (NEURONTIN) 400 MG capsule Take 400 mg by mouth 3 (three) times daily. (0900, 1300, 1700)    [provider]  GLUCAGEN HYPOKIT 1 MG SOLR injection Inject 1 mg into the muscle once as needed for low blood sugar. CBG < 50 07/09/18   [provider]  glucagon (GLUCAGON EMERGENCY) 1 MG injection Inject 1 mg into the muscle every 15 (fifteen) minutes as needed (for hypoglycemia for CBG<50).    [provider]  HYDROcodone-acetaminophen (NORCO/VICODIN) 5-325 MG tablet Take 1 tablet by mouth 3 (three) times daily as needed (for pain.). Patient taking differently: Take 1 tablet by mouth every 8 (eight) hours as needed (for pain.).  07/09/18   Florencia Reasons, MD  ibandronate (BONIVA) 150 MG tablet Take 150 mg by mouth every 30 (thirty) days. Take in the morning with a full glass of water, on an empty stomach, and do not take anything else by mouth or lie down for the next 30 min.    [provider]  insulin aspart (NOVOLOG FLEXPEN)  100 UNIT/ML FlexPen Inject 13 Units into the skin 3 (three) times daily with meals.    [provider]  insulin aspart (NOVOLOG) 100 UNIT/ML injection Inject 13 Units into the skin 3 (three) times daily before meals. Patient not taking: Reported on 07/17/2018 07/01/17   Renato Shin, MD  insulin detemir (LEVEMIR) 100 UNIT/ML injection Inject 0.2 mLs (20 Units total) into the skin at bedtime. Patient not taking: Reported on 07/17/2018 07/01/17   Renato Shin, MD  lisinopril (PRINIVIL,ZESTRIL) 2.5 MG tablet Take 1 tablet (2.5 mg total) by mouth every Monday, Wednesday, and Friday. (0900) Hold if sbp less than 90 07/09/18   Florencia Reasons, MD  loratadine (CLARITIN) 10 MG tablet Take 10 mg by mouth daily. (0900)    [provider]  Melatonin 3 MG TABS Take 6 mg by mouth at bedtime. (2100)    [provider]  ondansetron (ZOFRAN ODT) 8 MG disintegrating tablet Take 1 tablet (8 mg  total) by mouth every 8 (eight) hours as needed for nausea or vomiting. 07/17/18   Jola Schmidt, MD  ondansetron (ZOFRAN) 4 MG tablet Take 4 mg by mouth every 6 (six) hours as needed for nausea or vomiting.    [provider]  polyethylene glycol (MIRALAX / GLYCOLAX) packet Take 17 g by mouth daily. 07/09/18   Florencia Reasons, MD  pravastatin (PRAVACHOL) 40 MG tablet Take 40 mg by mouth daily at 6 PM. (1700)    [provider]  promethazine (PHENERGAN) 25 MG suppository Place 25 mg rectally every 6 (six) hours as needed for nausea or vomiting.    [provider]  senna-docusate (SENOKOT-S) 8.6-50 MG tablet Take 1 tablet by mouth at bedtime. Patient not taking: Reported on 07/17/2018 07/09/18   Florencia Reasons, MD  solifenacin (VESICARE) 10 MG tablet Take 5 mg by mouth daily.  07/09/18   [provider]  traZODone (DESYREL) 50 MG tablet Take 50 mg by mouth at bedtime. (2000)    [provider]  vitamin C (ASCORBIC ACID) 500 MG tablet Take 500 mg by mouth daily. (0900)    [provider]    Current Facility-Administered Medications  Medication Dose Route Frequency Provider Last Rate Last Dose  . 0.9 %  sodium chloride infusion   Intravenous Continuous Omar Person, NP 125 mL/hr at 07/04/2018 1100    . ceFEPIme (MAXIPIME) 1 g in sodium chloride 0.9 % 100 mL IVPB  1 g Intravenous Q12H Omar Person, NP      . chlorhexidine gluconate (MEDLINE KIT) (PERIDEX) 0.12 % solution 15 mL  15 mL Mouth Rinse BID Magdalen Spatz, NP   15 mL at 06/19/2018 1033  . dextrose 5 %-0.45 % sodium chloride infusion   Intravenous Continuous Omar Person, NP      . fentaNYL (SUBLIMAZE) bolus via infusion 50 mcg  50 mcg Intravenous Q1H PRN Omar Person, NP   50 mcg at 07/08/2018 0815  . fentaNYL 2557mg in NS 2548m(1039mml) infusion-PREMIX  25-400 mcg/hr Intravenous Continuous EubOmar PersonP 10 mL/hr at 06/27/2018 1253 100 mcg/hr at 07/19/2018 1253  . insulin  regular (NOVOLIN R,HUMULIN R) 100 Units in sodium chloride 0.9 % 100 mL (1 Units/mL) infusion   Intravenous Continuous EubOmar PersonP 4 mL/hr at 07/17/2018 1247 4 Units/hr at 07/13/2018 1247  . lactated ringers bolus 500 mL  500 mL Intravenous Once WicRipley FraiseD      . MEDLINE mouth rinse  15 mL Mouth  Rinse 10 times per day Magdalen Spatz, NP   15 mL at 07/12/2018 1135  . metroNIDAZOLE (FLAGYL) IVPB 500 mg  500 mg Intravenous Georgia Lopes, MD 100 mL/hr at 06/27/2018 1129 500 mg at 07/16/2018 1129  . pantoprazole (PROTONIX) injection 40 mg  40 mg Intravenous Q12H Omar Person, NP   40 mg at 07/14/2018 1102  . [START ON 07/19/2018] vancomycin (VANCOCIN) IVPB 1000 mg/200 mL premix  1,000 mg Intravenous Q24H Omar Person, NP        Allergies as of 06/25/2018 - Review Complete 07/10/2018  Allergen Reaction Noted  . Tape Other (See Comments) 02/07/2012  . Celebrex [celecoxib] Rash 09/30/2013  . Detrol [tolterodine] Hives 03/08/2014    Family History  Problem Relation Age of Onset  . Heart disease Mother   . Heart disease Sister   . Stroke Unknown   . Breast cancer Neg Hx     Social History   Socioeconomic History  . Marital status: Divorced    Spouse name: Not on file  . Number of children: Not on file  . Years of education: Not on file  . Highest education level: Not on file  Occupational History  . Not on file  Social Needs  . Financial resource strain: Not on file  . Food insecurity:    Worry: Not on file    Inability: Not on file  . Transportation needs:    Medical: Not on file    Non-medical: Not on file  Tobacco Use  . Smoking status: Never Smoker  . Smokeless tobacco: Never Used  Substance and Sexual Activity  . Alcohol use: No    Alcohol/week: 0.0 standard drinks  . Drug use: No  . Sexual activity: Never  Lifestyle  . Physical activity:    Days per week: Not on file    Minutes per session: Not on file  . Stress: Not on file    Relationships  . Social connections:    Talks on phone: Not on file    Gets together: Not on file    Attends religious service: Not on file    Active member of club or organization: Not on file    Attends meetings of clubs or organizations: Not on file    Relationship status: Not on file  . Intimate partner violence:    Fear of current or ex partner: Not on file    Emotionally abused: Not on file    Physically abused: Not on file    Forced sexual activity: Not on file  Other Topics Concern  . Not on file  Social History Narrative   Divroced and lives at home.    Has two sons who live in [place] at this time. (21&8 y/o).    Has a cat    Walks a lot.       She is having great success with her diet       Working with exercises that were given to her by PT/OT.     Review of Systems: All systems reviewed and negative except where noted in HPI.  Physical Exam: Vital signs in last 24 hours: Temp:  [98.1 F (36.7 C)-101 F (38.3 C)] 98.6 F (37 C) (08/30 1230) Pulse Rate:  [98-144] 105 (08/30 1230) Resp:  [20-34] 24 (08/30 1230) BP: (91-155)/(49-72) 114/64 (08/30 1230) SpO2:  [95 %-100 %] 100 % (08/30 1230) FiO2 (%):  [40 %-60 %] 40 % (08/30 1221) Weight:  [79.3 kg-80 kg] 80  kg (08/30 0340)   General:   partially alert white female in NAD Psych:   Cannot assess Eyes:  For the most part she keeps eyes close.  Ears:  Seems to have normal auditory acuity. Nose:  No deformity, discharge,  or lesions. Neck:  Supple; no masses Lungs:  Intubated, on vent.  Heart:  Regular rate and rhythm Abdomen:  Soft, non-distended, nontender, BS active, no palp mass    Rectal:  Deferred  Msk:  Symmetrical without gross deformities. . Neurologic:  Partially alert. Skin:  Intact without significant lesions or rashes..   Intake/Output from previous day: 08/29 0701 - 08/30 0700 In: 100 [IV Piggyback:100] Out: 900 [Urine:900] Intake/Output this shift: Total I/O In: 461.5 [I.V.:283.5; IV  Piggyback:178] Out: -   Lab Results: Recent Labs    07/17/18 1727 07/09/2018 0342 07/14/2018 0611 06/23/2018 1132  WBC 18.1* 34.0*  --  26.4*  HGB 12.9 12.0 11.9* 11.0*  HCT 38.2 43.0 35.0* 33.9*  PLT 499* 538*  --  415*   BMET Recent Labs    07/15/2018 0342 07/06/2018 0611 07/05/2018 0711 06/29/2018 1031  NA 136 139 140 147*  K 6.0* 4.7 4.2 3.5  CL 96* 106 102 108  CO2 <7*  --  15* 22  GLUCOSE 913* >700* 715* 332*  BUN 29* 36* 25* 23*  CREATININE 2.38* 1.30* 2.09* 1.89*  CALCIUM 9.0  --  8.2* 8.6*   LFT Recent Labs    07/06/2018 0342  PROT 6.9  ALBUMIN 3.4*  AST 27  ALT 10  ALKPHOS 111  BILITOT 2.3*   PT/INR Recent Labs    07/13/2018 0342  LABPROT 29.1*  INR 2.78    Studies/Results: Ct Head Wo Contrast  Result Date: 06/19/2018 CLINICAL DATA:  57 year old female with encephalopathy and unresponsiveness. EXAM: CT HEAD WITHOUT CONTRAST TECHNIQUE: Contiguous axial images were obtained from the base of the skull through the vertex without intravenous contrast. COMPARISON:  Brain MRI dated 07/05/2018 FINDINGS: Brain: A large areas of old infarct and encephalomalacia involving the bilateral MCA and PCA territory as seen on the prior MRI. Faint apparent cortical high attenuation involving the medial right occipital lobe likely represent cortical laminar necrosis. There is extensive diffuse white matter hypodensity and chronic ischemic changes. There is no acute intracranial hemorrhage. No mass effect or midline shift noted. No extra-axial fluid collection. Vascular: No hyperdense vessel or unexpected calcification. Skull: Normal. Negative for fracture or focal lesion. Sinuses/Orbits: There is mucoperiosteal thickening of the right maxillary sinus. There is opacification of the nasopharynx. Left mastoid effusion. The right mastoid air cells are clear. Other: An endotracheal and enteric tube are partially visualized. IMPRESSION: 1. No acute intracranial hemorrhage. 2. Extensive bilateral  MCA and PCA territory old infarcts and encephalomalacia. Advanced chronic microvascular ischemic changes. Electronically Signed   By: Anner Crete M.D.   On: 06/30/2018 07:03   Ct Abdomen Pelvis W Contrast  Result Date: 07/17/2018 CLINICAL DATA:  Nausea, vomiting and abdominal pain. History of stroke. EXAM: CT ABDOMEN AND PELVIS WITH CONTRAST TECHNIQUE: Multidetector CT imaging of the abdomen and pelvis was performed using the standard protocol following bolus administration of intravenous contrast. CONTRAST:  34m ISOVUE-300 IOPAMIDOL (ISOVUE-300) INJECTION 61% COMPARISON:  Abdominal radiograph July 17, 2018 and CT abdomen and pelvis Mar 24, 2016 FINDINGS: LOWER CHEST: Dependent atelectasis. Included heart size is normal. Status post median sternotomy. Similarly thickened edematous distal esophagus. HEPATOBILIARY: Liver and gallbladder are normal. PANCREAS: Normal. SPLEEN: Normal. ADRENALS/URINARY TRACT: Kidneys are orthotopic, demonstrating symmetric  enhancement. No nephrolithiasis, hydronephrosis or solid renal masses. Symmetric advanced vascular calcifications. The unopacified ureters are normal in course and caliber. Delayed imaging through the kidneys demonstrates symmetric prompt contrast excretion within the proximal urinary collecting system. Urinary bladder is partially distended and unremarkable. Normal adrenal glands. STOMACH/BOWEL: The stomach, small and large bowel are normal in caliber without inflammatory changes. Multiple loops of small and large bowel course freely in and out of umbilical hernia. Normal appendix. VASCULAR/LYMPHATIC: Aortoiliac vessels are normal in course and caliber. Moderate calcific atherosclerosis. No lymphadenopathy by CT size criteria. 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 OTHER: No intraperitoneal free fluid or free air. MUSCULOSKELETAL: Nonacute. Subacute appearing T11 superior endplate  moderate compression fracture. Moderate old T12 compression fracture. Wide necked moderate umbilical hernia. Asymmetrically atrophic LEFT iliopsoas muscles. LEFT femur ORIF with heterotopic ossification. Symmetric anterior abdominal wall soft tissue densities within subcutaneous fat most compatible with injection granulomas IMPRESSION: 1. Similarly thickened edematous distal esophagus, differential diagnosis includes recurrent esophagitis or mass. Recommend endoscopy. 2. No acute intra-abdominal or pelvic process. 3. Subacute appearing moderate T11 compression fracture. Aortic Atherosclerosis (ICD10-I70.0). Electronically Signed   By: Elon Alas M.D.   On: 07/17/2018 20:23   Dg Chest Port 1 View  Result Date: 06/30/2018 CLINICAL DATA:  Status post central line placement EXAM: PORTABLE CHEST 1 VIEW COMPARISON:  06/30/2018 FINDINGS: Cardiac shadow is within normal limits. Endotracheal tube and nasogastric catheter are again seen and stable. Right jugular central line is noted extending to the cavoatrial junction. No pneumothorax is seen. Lungs are well aerated bilaterally. No focal infiltrate or sizable effusion is seen. No bony abnormality is noted. IMPRESSION: Central line placement as described without evidence of pneumothorax. Electronically Signed   By: Inez Catalina M.D.   On: 07/15/2018 06:51   Dg Chest Port 1 View  Result Date: 07/10/2018 CLINICAL DATA:  57 y/o  F; intubation. EXAM: PORTABLE CHEST 1 VIEW COMPARISON:  07/04/2018 chest radiograph. FINDINGS: Stable cardiac silhouette given projection and technique. Aortic atherosclerosis with calcification. Endotracheal tube tip projects 2.9 cm above the carina. Enteric tube is coiling within the stomach, tip near the cardia. No pleural effusion or pneumothorax. No acute osseous abnormality is evident. IMPRESSION: 1. Endotracheal tube tip projects 2.9 cm above the carina. Enteric tube tip is coiling in the proximal stomach. 2. No active disease.  Electronically Signed   By: Kristine Garbe M.D.   On: 07/19/2018 06:11   Dg Chest Portable 1 View  Result Date: 06/19/2018 CLINICAL DATA:  57 year old female with shortness of breath. EXAM: PORTABLE CHEST 1 VIEW COMPARISON:  Chest radiograph dated 07/05/2018 FINDINGS: The lungs are clear. There is no pleural effusion or pneumothorax. Mild cardiomegaly. Median sternotomy wires and mechanical cardiac valve. No acute osseous pathology. IMPRESSION: No active disease. Electronically Signed   By: Anner Crete M.D.   On: 06/21/2018 04:15   Dg Abd 2 Views  Result Date: 07/17/2018 CLINICAL DATA:  Nausea and vomiting beginning last night. COMPARISON:  None available for comparison at time of study interpretation. FINDINGS: The bowel gas pattern is normal. Moderate retained large bowel stool. No intra-abdominal mass effect. Dystrophic calcification LEFT pelvis consistent with fibroid. LEFT femur ORIF partially imaged. Vascular calcifications. No intraperitoneal free air. IMPRESSION: 1. Moderate volume retained large bowel stool. Nonobstructive bowel gas pattern. Electronically Signed   By: Elon Alas M.D.   On: 07/17/2018 17:49     Tye Savoy, NP-C @  07/13/2018, 1:32 PM  Attending physician's note   I have taken an interval history, reviewed the chart and examined the patient. I agree with the Advanced Practitioner's note, impression, and recommendations as outlined.  She initially presented to the ER yesterday evening with complaint of nausea vomiting and abdominal pain with CT notable for thickening in the distal esophagus. No report of hematemesis on review of notes from initial ER presentation last evening. Hemoglobin at that time 12.9 with WBC 18 and evidence of AKI with BUN/creatinine 27/1.54.  She return to the ER early this morning after being found reportedly obtunded febrile and tachycardic at nursing facility.  Repeat CBC notable for WBC 34 and hemoglobin 12 with shock  physiology, elevated procalcitonin, lactic acidosis, DKA and was intubated for respiratory distress and AMS.  History obtained from chart.  Since admission, she has had coffee grounds from [facility].   Repeat hemoglobin 11.    Overall, clinical presentation seems most consistent with either Mallory-Weiss tear versus erosive esophagitis secondary to nausea vomiting.  However, given overall clinical status as well as low volume of blood loss and only mild decrement in serum hemoglobin (which also have some component of hemodilution), feel that risk outweighs benefit of endoscopic evaluation at this time.  Instead agree with medical management with high-dose acid suppression therapy, holding systemic anticoagulation, and serial CBCs.  Can consider diagnostic EGD to evaluate CT findings of esophagitis (i.e. rule out mass) once she has recovered from this acute event.  Will sign off at this time, however, please do not hesitate to contact the on-call if there is a change in hemodynamic status or volume output concerning for brisk upper GI bleed requiring more urgent/emergent endoscopic intervention.   [address] DO, FACG [phone] office

## 2018-07-18 NOTE — Care Management Note (Signed)
Case Management Note  Patient Details  Name: KAYLIA WINBORNE MRN: [id number] Date of Birth: 11-15-1961  Subjective/Objective:    From [facility], severe sepsis, DKA, acute resp insuff, gib, acute metabolic encephalopathy, esophagitis, conts on vent                Action/Plan: NCM will follow for transition of care needs.  Expected Discharge Date:                  Expected Discharge Plan:  Skilled Nursing Facility  In-House Referral:  Clinical Social Work  Discharge planning Services  CM Consult  Post Acute Care Choice:    Choice offered to:     DME Arranged:    DME Agency:     HH Arranged:    HH Agency:     Status of Service:  In process, will continue to follow  If discussed at Long Length of Stay Meetings, dates discussed:    Additional Comments:  Leone Haven, RN 07/12/2018, 10:21 AM

## 2018-07-18 NOTE — ED Triage Notes (Signed)
Pt arrives via EMS from [facility] altered, usually alert and talkative, now only responsive to painful stimuli. Pt hot to the touch, tachycardiac.RR 35-40.CBG reading HI, pupils non reactive.

## 2018-07-18 NOTE — Progress Notes (Signed)
Pharmacy Antibiotic Note  Madison [name] is a 57 y.o. female admitted on 07/31/2018 with VDRF/sepsis.  Pharmacy has been consulted for Vancomycin and Cefepime dosing.  Vancomycin 1 g IV given in ED at 0600   Plan: Vancomycin 1 g IV q24h Cefepime 1 g IV q12h  Height: 5\' 2"  (157.5 cm) Weight: 176 lb 5.9 oz (80 kg) IBW/kg (Calculated) : 50.1  Temp (24hrs), Avg:99.3 F (37.4 C), Min:98.3 F (36.8 C), Max:101 F (38.3 C)  Recent Labs  Lab 07/17/18 1727 31-Jul-2018 0342 07/31/2018 0439 Jul 31, 2018 0611  WBC 18.1* 34.0*  --   --   CREATININE 1.54* 2.38*  --  1.30*  LATICACIDVEN  --   --  9.78*  --     Estimated Creatinine Clearance: 46.8 mL/min (A) (by C-G formula based on SCr of 1.3 mg/dL (H)).    Allergies  Allergen Reactions  . Tape Other (See Comments)    Can burn skin if left on too long. Reaction:Burning  Can burn skin if left on too long.  . Celebrex [Celecoxib] Rash  . Detrol [Tolterodine] Hives    07/20/18 07-31-18 6:56 AM

## 2018-07-18 NOTE — ED Notes (Signed)
Called lab regarding tests not being processed, per [facility] in lab, tubes have not been received yet. Dr. Bebe Shaggy made aware. Pt very difficult stick.

## 2018-07-18 NOTE — Progress Notes (Signed)
Initial Nutrition Assessment  DOCUMENTATION CODES:   Obesity unspecified  INTERVENTION:   Recommend initiation of tube feeding within 24-48 hours (or as soon as medically able) if pt remains on vent support  Tube Feeding:  Vital High Protein @ 45 ml/hr Pro-Stat 30 mL daily Provides 110 g of protein, 1180 kcals, 907 mL of free water Meets 100% protein needs, 105% calorie needs  NUTRITION DIAGNOSIS:   Inadequate oral intake related to acute illness as evidenced by NPO status.  GOAL:   Patient will meet greater than or equal to 90% of their needs, Provide needs based on ASPEN/SCCM guidelines   MONITOR:   TF tolerance, Vent status, Labs, Weight trends, Skin  REASON FOR ASSESSMENT:   Ventilator    ASSESSMENT:   57 yo female admitted with DKA, severe sepsis, respiratory insufficiency due to acute encephalopathy, vomiting dark emesis, required intubation . Pt with hx of HTN, DM, multiple CVA with residual deficits. Pt admitted from [facility] rehab. Pt with admission earlier this month s/p fall with ischemic CVA   OG tube with dark coffee ground output; GI consult pending  Patient is currently intubated on ventilator support MV: 10.4 L/min Temp (24hrs), Avg:98.5 F (36.9 C), Min:98.1 F (36.7 C), Max:101 F (38.3 C)  Per weight encounters, weight has been relatively stable recently.  Unable to obtain diet history at present time  Labs: Creatinine 2.09, BUN 25, CBGs improving Meds: insulin drip   Diet Order:   Diet Order            Diet NPO time specified  Diet effective now              EDUCATION NEEDS:   Not appropriate for education at this time  Skin:  Skin Assessment: Skin Integrity Issues: Skin Integrity Issues:: DTI DTI: heel  Last BM:  no documented BM  Height:   Ht Readings from Last 1 Encounters:  Aug 15, 2018 5\' 2"  (1.575 m)    Weight:   Wt Readings from Last 1 Encounters:  08/15/18 80 kg    Ideal Body Weight:  50 kg  BMI:  Body  mass index is 32.26 kg/m.  Estimated Nutritional Needs:   Kcal:  [phone] kcals   Protein:  100-126 g  Fluid:  >/= 1.7 L  07/20/18 MS, RD, LDN, CNSC [phone] Pager  [phone] Weekend/On-Call Pager

## 2018-07-18 NOTE — ED Provider Notes (Signed)
[facility] EMERGENCY DEPARTMENT Provider Note   CSN: [id number] Arrival date & time: 07/14/2018  0327     History   Chief Complaint Chief Complaint  Patient presents with  . Altered Mental Status  Level 5 caveat due to altered mental status  HPI Madison [name] is a 57 y.o. female.  The history is provided by the EMS personnel.  Altered Mental Status   This is a new problem. The problem has been rapidly worsening. Associated symptoms include confusion.  Pt with history of CAD, CHF, stroke presents with altered mental status.  She presents from skilled nursing facility acting altered.  She is noted to be febrile and tachycardic.  She is also noted that her glucose is reading high. Patient was recently seen in the emergency department for abdominal pain and improved and was discharged.  No other details known at this time.  Past Medical History:  Diagnosis Date  . Allergy   . Anxiety   . Aortic stenosis   . CAD (coronary artery disease)   . CHF (congestive heart failure) (HCC)   . Depression   . Diabetes (HCC) dx'd 1982   "went straight to insulin"  . Dyslipidemia   . GERD (gastroesophageal reflux disease)   . Heart murmur   . HTN (hypertension)   . Hypercholesteremia   . Myocardial infarction (HCC) 12/2014  . Obesity   . Pneumonia 11/2014; 12/2014  . Rheumatoid arthritis(714.0)    "hands" (07/01/2015)  . Stroke syndrome 1995; 2001   "when my son was born; problems w/speech and L hand since then" (01/19/2015)  . Thrombophlebitis     Patient Active Problem List   Diagnosis Date Noted  . Septic arthritis of knee, left (HCC) 07/05/2018  . Atherosclerosis of artery of extremity with ulceration (HCC) 04/28/2018  . Insulin dependent diabetes mellitus (HCC) 03/07/2017  . Closed displaced intertrochanteric fracture of left femur (HCC) 11/29/2016  . Difficult airway for intubation 11/29/2016  . SIRS (systemic inflammatory response syndrome) (HCC) 05/25/2016  .  MDD (major depressive disorder), recurrent episode, moderate (HCC)   . Aspiration pneumonia (HCC) 05/17/2016  . Diabetic ketoacidosis without coma (HCC)   . Sepsis secondary to UTI (HCC)   . Acute hypoxemic respiratory failure (HCC)   . Chronic combined systolic and diastolic congestive heart failure (HCC)   . Hypoglycemia   . Chronic diastolic heart failure (HCC)   . Esophageal reflux   . S/P AVR (aortic valve replacement)   . Constipation 11/23/2015  . Pressure ulcer 08/02/2015  . Diabetic ketoacidosis with coma associated with other specified diabetes mellitus (HCC)   . Closed left ankle fracture 07/01/2015  . Trimalleolar fracture of left ankle 07/01/2015  . Essential hypertension   . Diabetes type 1, uncontrolled (HCC)   . S/P aortic valve replacement   . Blood poisoning   . Spastic hemiplegia affecting nondominant side (HCC) 11/29/2014  . Dysphagia, pharyngoesophageal phase 10/04/2014  . Depression 04/29/2014  . CVA (cerebral infarction) 03/12/2014  . Chronic anticoagulation 07/28/2013  . CAD (coronary artery disease)   . Ischemic stroke (HCC)   . Hyperlipidemia   . Rheumatoid arthritis [facility])     Past Surgical History:  Procedure Laterality Date  . AORTIC VALVE REPLACEMENT (AVR)/CORONARY ARTERY BYPASS GRAFTING (CABG)  "2014"   Madison [name] 03/08/2014  . CARDIAC CATHETERIZATION  12/08/2014  . CATARACT EXTRACTION Left   . CESAREAN SECTION  1995  . CORONARY ANGIOPLASTY WITH STENT PLACEMENT  12/09/2014  . CORONARY ARTERY BYPASS GRAFT    .  FOOT FRACTURE SURGERY    . FRACTURE SURGERY    . INTRAMEDULLARY (IM) NAIL INTERTROCHANTERIC Left 11/30/2016   Procedure: INTRAMEDULLARY (IM) NAIL LEFT HIP;  Surgeon: Yolonda Kida, MD;  Location: [facility] OR;  Service: Orthopedics;  Laterality: Left;  . KNEE ARTHROSCOPY Right   . LEFT HEART CATHETERIZATION WITH CORONARY ANGIOGRAM N/A 12/08/2014   Procedure: LEFT HEART CATHETERIZATION WITH CORONARY ANGIOGRAM;  Surgeon: Iran Ouch, MD;   Location: [facility] CATH LAB;  Service: Cardiovascular;  Laterality: N/A;  . LOWER EXTREMITY ANGIOGRAPHY N/A 05/08/2018   Procedure: LOWER EXTREMITY ANGIOGRAPHY;  Surgeon: Fransisco Hertz, MD;  Location: [facility] INVASIVE CV LAB;  Service: Cardiovascular;  Laterality: N/A;  . LOWER EXTREMITY ANGIOGRAPHY N/A 06/05/2018   Procedure: LOWER EXTREMITY ANGIOGRAPHY - Right Leg;  Surgeon: Fransisco Hertz, MD;  Location: [facility] INVASIVE CV LAB;  Service: Cardiovascular;  Laterality: N/A;  . NEUROPLASTY / TRANSPOSITION MEDIAN NERVE AT CARPAL TUNNEL    . ORIF ANKLE FRACTURE Left 07/04/2015   Procedure: OPEN REDUCTION INTERNAL FIXATION (ORIF)  TRIMAL ANKLE FRACTURE;  Surgeon: [name], MD;  Location: [facility] OR;  Service: Orthopedics;  Laterality: Left;  . PERCUTANEOUS CORONARY STENT INTERVENTION (PCI-S) N/A 12/09/2014   Procedure: PERCUTANEOUS CORONARY STENT INTERVENTION (PCI-S);  Surgeon: [name], MD;  Location: [facility] CATH LAB;  Service: Cardiovascular;  Laterality: N/A;  . PERIPHERAL VASCULAR BALLOON ANGIOPLASTY Left 05/08/2018   Procedure: PERIPHERAL VASCULAR BALLOON ANGIOPLASTY;  Surgeon: Fransisco Hertz, MD;  Location: [facility] INVASIVE CV LAB;  Service: Cardiovascular;  Laterality: Left;  Left anterior tibial, and tibioperoneal trunk  . PERIPHERAL VASCULAR INTERVENTION Left 05/08/2018   Procedure: PERIPHERAL VASCULAR INTERVENTION;  Surgeon: Fransisco Hertz, MD;  Location: [facility] INVASIVE CV LAB;  Service: Cardiovascular;  Laterality: Left;  Popliteal artery  . PERIPHERAL VASCULAR INTERVENTION Right 06/05/2018   Procedure: PERIPHERAL VASCULAR INTERVENTION;  Surgeon: Fransisco Hertz, MD;  Location: [facility] INVASIVE CV LAB;  Service: Cardiovascular;  Laterality: Right;  . TUBAL LIGATION  1995     OB History   None      Home Medications    Prior to Admission medications   Medication Sig Start Date End Date Taking? Authorizing Provider  acetaminophen (TYLENOL) 325 MG tablet Take 2 tablets (650 mg total) by mouth every 6 (six) hours as needed  for mild pain (or Fever >/= 101). 12/04/16   Zannie Cove, MD  calcitonin, salmon, (MIACALCIN/FORTICAL) 200 UNIT/ACT nasal spray Place 1 spray into alternate nostrils daily. (0900)    [provider]  CALCIUM PO Take 600 mg by mouth daily.    [provider]  clopidogrel (PLAVIX) 75 MG tablet Take 1 tablet (75 mg total) by mouth daily. 05/08/18 05/08/19  Fransisco Hertz, MD  ergocalciferol (VITAMIN D2) 50000 units capsule Take 50,000 Units by mouth every 30 (thirty) days.     [provider]  escitalopram (LEXAPRO) 20 MG tablet Take 20 mg by mouth daily. (0900)    [provider]  ferrous sulfate 325 (65 FE) MG tablet Take 325 mg by mouth daily with breakfast. (0800)    [provider]  gabapentin (NEURONTIN) 400 MG capsule Take 400 mg by mouth 3 (three) times daily. (0900, 1300, 1700)    [provider]  GLUCAGEN HYPOKIT 1 MG SOLR injection Inject 1 mg into the muscle once as needed for low blood sugar. CBG < 50 07/09/18   [provider]  glucagon (GLUCAGON EMERGENCY) 1 MG injection Inject 1 mg into the  muscle every 15 (fifteen) minutes as needed (for hypoglycemia for CBG<50).    [provider]  HYDROcodone-acetaminophen (NORCO/VICODIN) 5-325 MG tablet Take 1 tablet by mouth 3 (three) times daily as needed (for pain.). Patient taking differently: Take 1 tablet by mouth every 8 (eight) hours as needed (for pain.).  07/09/18   Albertine Grates, MD  ibandronate (BONIVA) 150 MG tablet Take 150 mg by mouth every 30 (thirty) days. Take in the morning with a full glass of water, on an empty stomach, and do not take anything else by mouth or lie down for the next 30 min.    [provider]  insulin aspart (NOVOLOG FLEXPEN) 100 UNIT/ML FlexPen Inject 13 Units into the skin 3 (three) times daily with meals.    [provider]  insulin aspart (NOVOLOG) 100 UNIT/ML injection Inject 13 Units into the skin 3 (three) times daily before  meals. Patient not taking: Reported on 07/17/2018 07/01/17   Romero Belling, MD  insulin detemir (LEVEMIR) 100 UNIT/ML injection Inject 0.2 mLs (20 Units total) into the skin at bedtime. Patient not taking: Reported on 07/17/2018 07/01/17   Romero Belling, MD  lisinopril (PRINIVIL,ZESTRIL) 2.5 MG tablet Take 1 tablet (2.5 mg total) by mouth every Monday, Wednesday, and Friday. (0900) Hold if sbp less than 90 07/09/18   Albertine Grates, MD  loratadine (CLARITIN) 10 MG tablet Take 10 mg by mouth daily. (0900)    [provider]  Melatonin 3 MG TABS Take 6 mg by mouth at bedtime. (2100)    [provider]  ondansetron (ZOFRAN ODT) 8 MG disintegrating tablet Take 1 tablet (8 mg total) by mouth every 8 (eight) hours as needed for nausea or vomiting. 07/17/18   Azalia Bilis, MD  ondansetron (ZOFRAN) 4 MG tablet Take 4 mg by mouth every 6 (six) hours as needed for nausea or vomiting.    [provider]  polyethylene glycol (MIRALAX / GLYCOLAX) packet Take 17 g by mouth daily. 07/09/18   Albertine Grates, MD  pravastatin (PRAVACHOL) 40 MG tablet Take 40 mg by mouth daily at 6 PM. (1700)    [provider]  promethazine (PHENERGAN) 25 MG suppository Place 25 mg rectally every 6 (six) hours as needed for nausea or vomiting.    [provider]  senna-docusate (SENOKOT-S) 8.6-50 MG tablet Take 1 tablet by mouth at bedtime. Patient not taking: Reported on 07/17/2018 07/09/18   Albertine Grates, MD  solifenacin (VESICARE) 10 MG tablet Take 5 mg by mouth daily.  07/09/18   [provider]  traZODone (DESYREL) 50 MG tablet Take 50 mg by mouth at bedtime. (2000)    [provider]  vitamin C (ASCORBIC ACID) 500 MG tablet Take 500 mg by mouth daily. (0900)    [provider]    Family History Family History  Problem Relation Age of Onset  . Heart disease Mother   . Heart disease Sister   . Stroke Unknown   . Breast cancer Neg Hx     Social History Social History    Tobacco Use  . Smoking status: Never Smoker  . Smokeless tobacco: Never Used  Substance Use Topics  . Alcohol use: No    Alcohol/week: 0.0 standard drinks  . Drug use: No     Allergies   Tape; Celebrex [celecoxib]; and Detrol [tolterodine]   Review of Systems Review of Systems  Unable to perform ROS: Mental status change  Psychiatric/Behavioral: Positive for confusion.     Physical  Exam Updated Vital Signs BP (!) 155/66 (BP Location: Right Arm)   Pulse (!) 144   Temp (!) 101 F (38.3 C) (Rectal)   Resp (!) 29   Ht 1.6 m (5\' 3" )   Wt 80 kg   SpO2 98%   BMI 31.24 kg/m   Physical Exam CONSTITUTIONAL: Elderly, altered, distress noted, smells of ketones HEAD: Normocephalic/atraumatic EYES: EOMI/PERRL ENMT: Mucous membranes dry NECK: supple no meningeal signs SPINE/BACK:entire spine nontender CV: S1/S2 noted, tachycardic, no loud murmurs LUNGS: Tachypneic with coarse breath sounds bilaterally ABDOMEN: soft, nontender NEURO: Pt is awake/alert but she is altered moaning at times during exam.  She does not follow commands. EXTREMITIES: pulses normal/equal, full ROM SKIN: warm To touch PSYCH: Unable to assess   ED Treatments / Results  Labs (all labs ordered are listed, but only abnormal results are displayed) Labs Reviewed  CBC WITH DIFFERENTIAL/PLATELET - Abnormal; Notable for the following components:      Result Value   WBC 34.0 (*)    MCV 103.1 (*)    MCHC 27.9 (*)    Platelets 538 (*)    All other components within normal limits  PROTIME-INR - Abnormal; Notable for the following components:   Prothrombin Time 29.1 (*)    All other components within normal limits  I-STAT CG4 LACTIC ACID, ED - Abnormal; Notable for the following components:   Lactic Acid, Venous 9.78 (*)    All other components within normal limits  I-STAT BETA HCG BLOOD, ED ([facility], [facility], [facility] ONLY) - Abnormal; Notable for the following components:   I-stat hCG, quantitative 11.3 (*)    All  other components within normal limits  I-STAT ARTERIAL BLOOD GAS, ED - Abnormal; Notable for the following components:   pH, Arterial 7.345 (*)    pCO2 arterial 17.3 (*)    pO2, Arterial 208.0 (*)    Bicarbonate 9.4 (*)    TCO2 10 (*)    Acid-base deficit 14.0 (*)    All other components within normal limits  I-STAT CHEM 8, ED - Abnormal; Notable for the following components:   BUN 36 (*)    Creatinine, Ser 1.30 (*)    Glucose, Bld >700 (*)    Calcium, Ion 1.09 (*)    TCO2 12 (*)    Hemoglobin 11.9 (*)    HCT 35.0 (*)    All other components within normal limits  CULTURE, BLOOD (ROUTINE X 2)  CULTURE, BLOOD (ROUTINE X 2)  URINE CULTURE  COMPREHENSIVE METABOLIC PANEL  URINALYSIS, ROUTINE W REFLEX MICROSCOPIC  PROCALCITONIN  LACTIC ACID, PLASMA  LACTIC ACID, PLASMA  BLOOD GAS, ARTERIAL  BETA-HYDROXYBUTYRIC ACID  BRAIN NATRIURETIC PEPTIDE  I-STAT VENOUS BLOOD GAS, ED  TYPE AND SCREEN    EKG EKG Interpretation  Date/Time:  Friday July 18 2018 03:40:46 EDT Ventricular Rate:  143 PR Interval:    QRS Duration: 91 QT Interval:  372 QTC Calculation: 574 R Axis:   -80 Text Interpretation:  Sinus or ectopic atrial tachycardia Left anterior fascicular block Prolonged QT interval Abnormal ekg Confirmed by Zadie Rhine ([id number]) on 06/22/2018 4:34:43 AM   Radiology Ct Abdomen Pelvis W Contrast  Result Date: 07/17/2018 CLINICAL DATA:  Nausea, vomiting and abdominal pain. History of stroke. EXAM: CT ABDOMEN AND PELVIS WITH CONTRAST TECHNIQUE: Multidetector CT imaging of the abdomen and pelvis was performed using the standard protocol following bolus administration of intravenous contrast. CONTRAST:  80mL ISOVUE-300 IOPAMIDOL (ISOVUE-300) INJECTION 61% COMPARISON:  Abdominal radiograph July 17, 2018 and  CT abdomen and pelvis Mar 24, 2016 FINDINGS: LOWER CHEST: Dependent atelectasis. Included heart size is normal. Status post median sternotomy. Similarly thickened edematous distal  esophagus. HEPATOBILIARY: Liver and gallbladder are normal. PANCREAS: Normal. SPLEEN: Normal. ADRENALS/URINARY TRACT: Kidneys are orthotopic, demonstrating symmetric enhancement. No nephrolithiasis, hydronephrosis or solid renal masses. Symmetric advanced vascular calcifications. The unopacified ureters are normal in course and caliber. Delayed imaging through the kidneys demonstrates symmetric prompt contrast excretion within the proximal urinary collecting system. Urinary bladder is partially distended and unremarkable. Normal adrenal glands. STOMACH/BOWEL: The stomach, small and large bowel are normal in caliber without inflammatory changes. Multiple loops of small and large bowel course freely in and out of umbilical hernia. Normal appendix. VASCULAR/LYMPHATIC: Aortoiliac vessels are normal in course and caliber. Moderate calcific atherosclerosis. No lymphadenopathy by CT size criteria. 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 OTHER: No intraperitoneal free fluid or free air. MUSCULOSKELETAL: Nonacute. Subacute appearing T11 superior endplate moderate compression fracture. Moderate old T12 compression fracture. Wide necked moderate umbilical hernia. Asymmetrically atrophic LEFT iliopsoas muscles. LEFT femur ORIF with heterotopic ossification. Symmetric anterior abdominal wall soft tissue densities within subcutaneous fat most compatible with injection granulomas IMPRESSION: 1. Similarly thickened edematous distal esophagus, differential diagnosis includes recurrent esophagitis or mass. Recommend endoscopy. 2. No acute intra-abdominal or pelvic process. 3. Subacute appearing moderate T11 compression fracture. Aortic Atherosclerosis (ICD10-I70.0). Electronically Signed   By: Awilda Metro M.D.   On: 07/17/2018 20:23   Dg Chest Port 1 View  Result Date: 07-Aug-2018 CLINICAL DATA:  57 y/o  F; intubation. EXAM: PORTABLE CHEST 1 VIEW  COMPARISON:  2018/08/07 chest radiograph. FINDINGS: Stable cardiac silhouette given projection and technique. Aortic atherosclerosis with calcification. Endotracheal tube tip projects 2.9 cm above the carina. Enteric tube is coiling within the stomach, tip near the cardia. No pleural effusion or pneumothorax. No acute osseous abnormality is evident. IMPRESSION: 1. Endotracheal tube tip projects 2.9 cm above the carina. Enteric tube tip is coiling in the proximal stomach. 2. No active disease. Electronically Signed   By: Mitzi Hansen M.D.   On: 08/07/2018 06:11   Dg Chest Portable 1 View  Result Date: 08-07-2018 CLINICAL DATA:  58 year old female with shortness of breath. EXAM: PORTABLE CHEST 1 VIEW COMPARISON:  Chest radiograph dated 07/05/2018 FINDINGS: The lungs are clear. There is no pleural effusion or pneumothorax. Mild cardiomegaly. Median sternotomy wires and mechanical cardiac valve. No acute osseous pathology. IMPRESSION: No active disease. Electronically Signed   By: Elgie Collard M.D.   On: 2018/08/07 04:15   Dg Abd 2 Views  Result Date: 07/17/2018 CLINICAL DATA:  Nausea and vomiting beginning last night. COMPARISON:  None available for comparison at time of study interpretation. FINDINGS: The bowel gas pattern is normal. Moderate retained large bowel stool. No intra-abdominal mass effect. Dystrophic calcification LEFT pelvis consistent with fibroid. LEFT femur ORIF partially imaged. Vascular calcifications. No intraperitoneal free air. IMPRESSION: 1. Moderate volume retained large bowel stool. Nonobstructive bowel gas pattern. Electronically Signed   By: Awilda Metro M.D.   On: 07/17/2018 17:49    Procedures Procedure Name: Intubation Date/Time: 08/07/2018 5:55 AM Performed by: Zadie Rhine, MD Pre-anesthesia Checklist: Emergency Drugs available and Suction available Oxygen Delivery Method: Nasal cannula Preoxygenation: Pre-oxygenation with 100% oxygen Induction  Type: Rapid sequence Laryngoscope Size: Glidescope Grade View: Grade I Tube size: 7.5 mm Number of attempts: 1 Airway Equipment and Method: Video-laryngoscopy Placement Confirmation: ETT inserted through vocal cords under direct vision,  CO2 detector and Breath sounds checked- equal and bilateral      CRITICAL CARE Performed by: Joya Gaskins Total critical care time: 50 minutes Critical care time was exclusive of separately billable procedures and treating other patients. Critical care was necessary to treat or prevent imminent or life-threatening deterioration. Critical care was time spent personally by me on the following activities: development of treatment plan with patient and/or surrogate as well as nursing, discussions with consultants, evaluation of patient's response to treatment, examination of patient, obtaining history from patient or surrogate, ordering and performing treatments and interventions, ordering and review of laboratory studies, ordering and review of radiographic studies, pulse oximetry and re-evaluation of patient's condition.   Medications Ordered in ED Medications  lactated ringers bolus 1,000 mL (1,000 mLs Intravenous New Bag/Given Aug 17, 2018 0429)    And  lactated ringers bolus 1,000 mL (1,000 mLs Intravenous New Bag/Given 2018/08/17 0421)    And  lactated ringers bolus 500 mL (has no administration in time range)  metroNIDAZOLE (FLAGYL) IVPB 500 mg (500 mg Intravenous New Bag/Given 08/17/2018 0517)  vancomycin (VANCOCIN) IVPB 1000 mg/200 mL premix (1,000 mg Intravenous New Bag/Given 08/17/2018 0558)  insulin aspart (novoLOG) injection 0-15 Units (has no administration in time range)  0.9 %  sodium chloride infusion (has no administration in time range)  propofol (DIPRIVAN) 1000 MG/100ML infusion (has no administration in time range)  etomidate (AMIDATE) injection (20 mg Intravenous Given 08/17/18 0538)  fentaNYL (SUBLIMAZE) injection 50 mcg (has no  administration in time range)  fentaNYL in NS (40mcg/ml) infusion-PREMIX (has no administration in time range)  fentaNYL (SUBLIMAZE) bolus via infusion 50 mcg (has no administration in time range)  rocuronium (ZEMURON) injection (80 mg Intravenous Given 08/17/2018 0538)  pantoprazole (PROTONIX) injection 40 mg (has no administration in time range)  ceFEPIme (MAXIPIME) 2 g in sodium chloride 0.9 % 100 mL IVPB (0 g Intravenous Stopped 17-Aug-2018 0508)  acetaminophen (TYLENOL) suppository 650 mg (650 mg Rectal Given August 17, 2018 0508)  sodium bicarbonate injection 100 mEq (100 mEq Intravenous Given August 17, 2018 0508)     Initial Impression / Assessment and Plan / ED Course  I have reviewed the triage vital signs and the nursing notes.  Pertinent labs & imaging results that were available during my care of the patient were reviewed by me and considered in my medical decision making (see chart for details).     4:54 AM Patient presents from nursing facility for altered mental status.  She is febrile, tachycardic and altered.  She is clearly in DKA just by elevated glucose and smells of ketones.  Code sepsis has been called.  Her lactate is elevated up to 9.  She is already receiving IV fluids and IV antibiotics.  We will consult critical care 6:03 AM We had difficulty obtaining labs on this patient.  However given elevated glucose as well as tachypnea and smells of ketones, likely DKA.  She also has sepsis of unclear etiology has been given broad-spectrum antibiotics and IV fluids.  I spoke with critical care who recommended 2 Amps of bicarb. On their arrival to room patient began vomiting dark emesis.  She began to become hypoxic.  Patient was emergently intubated to protect her airway.  Her respiratory rate was increased to compensate for her metabolic acidosis. Due to lack of laboratory evaluation, I did an emergent right femoral groin stick to obtain labs.  Critical care team is now at bedside  placing a central line. Patient had a negative CT  scan yesterday.  Her abdomen was soft without distention.  Ischemic bowel is a possibility, but her elevated lactate could be a combination of sepsis as well as DKA.  At this point aggressive fluid resuscitation with IV antibiotics as well as control of glucose are taking precedence. 6:33 AM Critical care physician at bedside Pt to be admitted to ICU Final Clinical Impressions(s) / ED Diagnoses   Final diagnoses:  Sepsis, due to unspecified organism [facility])  Diabetic ketoacidosis with coma associated with type 1 diabetes mellitus [facility])  Metabolic acidosis    ED Discharge Orders    None       Zadie Rhine, MD 07-20-18 [phone]

## 2018-07-18 NOTE — Procedures (Signed)
Central Venous Catheter Insertion Procedure Note NICKOL COLLISTER [id number] 1961-06-13  Procedure: Insertion of Central Venous Catheter Indications: Assessment of intravascular volume, Drug and/or fluid administration and Frequent blood sampling  Procedure Details Consent: Unable to obtain consent because of emergent medical necessity. Time Out: Verified patient identification, verified procedure, site/side was marked, verified correct patient position, special equipment/implants available, medications/allergies/relevent history reviewed, required imaging and test results available.  Performed  Maximum sterile technique was used including antiseptics, cap, gloves, gown, hand hygiene, mask and sheet. Skin prep: Chlorhexidine; local anesthetic administered A antimicrobial bonded/coated triple lumen catheter was placed in the right internal jugular vein using the Seldinger technique.  Evaluation Blood flow good Complications: No apparent complications Patient did tolerate procedure well. Chest X-ray ordered to verify placement.  CXR: pending.  Jovita Kussmaul, AGACNP-BC [facility]  Pgr: [phone]  PCCM Pgr: [phone]

## 2018-07-18 NOTE — Progress Notes (Signed)
Physically spoke with Pharmacist. Confirmation to continue Insulin drip until 2 hours post Levemir admin (as ordered in Phase 3 Adult ICU Glycemic Control Protocol). Will hold Novolog as pt is NPO, not receiving enteral feeds, and is still receiving Insulin drip. Will continue to follow protocol as ordered.  Margot Ables, RN

## 2018-07-19 ENCOUNTER — Inpatient Hospital Stay (HOSPITAL_COMMUNITY): Payer: Medicare Other

## 2018-07-19 DIAGNOSIS — Z9911 Dependence on respirator [ventilator] status: Secondary | ICD-10-CM

## 2018-07-19 DIAGNOSIS — J9601 Acute respiratory failure with hypoxia: Secondary | ICD-10-CM

## 2018-07-19 DIAGNOSIS — R6521 Severe sepsis with septic shock: Secondary | ICD-10-CM

## 2018-07-19 DIAGNOSIS — E876 Hypokalemia: Secondary | ICD-10-CM

## 2018-07-19 LAB — GLUCOSE, CAPILLARY
Glucose-Capillary: 145 mg/dL — ABNORMAL HIGH (ref 70–99)
Glucose-Capillary: 172 mg/dL — ABNORMAL HIGH (ref 70–99)
Glucose-Capillary: 174 mg/dL — ABNORMAL HIGH (ref 70–99)
Glucose-Capillary: 188 mg/dL — ABNORMAL HIGH (ref 70–99)
Glucose-Capillary: 158 mg/dL — ABNORMAL HIGH (ref 70–99)

## 2018-07-19 LAB — PROCALCITONIN: PROCALCITONIN: 8.84 ng/mL

## 2018-07-19 LAB — BASIC METABOLIC PANEL
ANION GAP: 8 (ref 5–15)
ANION GAP: 10 (ref 5–15)
ANION GAP: 7 (ref 5–15)
BUN: 17 mg/dL (ref 6–20)
BUN: 12 mg/dL (ref 6–20)
BUN: 12 mg/dL (ref 6–20)
CALCIUM: 8.1 mg/dL — AB (ref 8.9–10.3)
CALCIUM: 8.1 mg/dL — AB (ref 8.9–10.3)
CO2: 27 mmol/L (ref 22–32)
CO2: 27 mmol/L (ref 22–32)
CO2: 26 mmol/L (ref 22–32)
Calcium: 8.1 mg/dL — ABNORMAL LOW (ref 8.9–10.3)
Chloride: 111 mmol/L (ref 98–111)
Chloride: 109 mmol/L (ref 98–111)
Chloride: 110 mmol/L (ref 98–111)
Creatinine, Ser: 1.13 mg/dL — ABNORMAL HIGH (ref 0.44–1.00)
Creatinine, Ser: 1.06 mg/dL — ABNORMAL HIGH (ref 0.44–1.00)
Creatinine, Ser: 1.02 mg/dL — ABNORMAL HIGH (ref 0.44–1.00)
GFR calc non Af Amer: 53 mL/min — ABNORMAL LOW (ref >60)
GFR, EST NON AFRICAN AMERICAN: 57 mL/min — AB (ref >60)
GFR, EST NON AFRICAN AMERICAN: 60 mL/min — AB (ref >60)
Glucose, Bld: 174 mg/dL — ABNORMAL HIGH (ref 70–99)
Glucose, Bld: 182 mg/dL — ABNORMAL HIGH (ref 70–99)
Glucose, Bld: 161 mg/dL — ABNORMAL HIGH (ref 70–99)
POTASSIUM: 2.9 mmol/L — AB (ref 3.5–5.1)
POTASSIUM: 4.8 mmol/L (ref 3.5–5.1)
Potassium: 3.3 mmol/L — ABNORMAL LOW (ref 3.5–5.1)
SODIUM: 146 mmol/L — AB (ref 135–145)
Sodium: 146 mmol/L — ABNORMAL HIGH (ref 135–145)
Sodium: 143 mmol/L (ref 135–145)

## 2018-07-19 LAB — CBC WITH DIFFERENTIAL/PLATELET
ABS IMMATURE GRANULOCYTES: 0.1 10*3/uL (ref 0.0–0.1)
BASOS ABS: 0.1 10*3/uL (ref 0.0–0.1)
BASOS PCT: 0 %
EOS ABS: 0 10*3/uL (ref 0.0–0.7)
Eosinophils Relative: 0 %
HCT: 28.4 % — ABNORMAL LOW (ref 36.0–46.0)
Hemoglobin: 9.1 g/dL — ABNORMAL LOW (ref 12.0–15.0)
Immature Granulocytes: 1 %
Lymphocytes Relative: 19 %
Lymphs Abs: 3.2 10*3/uL (ref 0.7–4.0)
MCH: 29.4 pg (ref 26.0–34.0)
MCHC: 32 g/dL (ref 30.0–36.0)
MCV: 91.6 fL (ref 78.0–100.0)
MONO ABS: 0.5 10*3/uL (ref 0.1–1.0)
Monocytes Relative: 3 %
Neutro Abs: 13.3 10*3/uL — ABNORMAL HIGH (ref 1.7–7.7)
Neutrophils Relative %: 77 %
PLATELETS: 332 10*3/uL (ref 150–400)
RBC: 3.1 MIL/uL — ABNORMAL LOW (ref 3.87–5.11)
RDW: 14.4 % (ref 11.5–15.5)
WBC: 17.2 10*3/uL — ABNORMAL HIGH (ref 4.0–10.5)

## 2018-07-19 LAB — POCT I-STAT 3, ART BLOOD GAS (G3+)
Acid-Base Excess: 1 mmol/L (ref 0.0–2.0)
BICARBONATE: 25 mmol/L (ref 20.0–28.0)
O2 SAT: 88 %
PO2 ART: 52 mmHg — AB (ref 83.0–108.0)
TCO2: 26 mmol/L (ref 22–32)
pCO2 arterial: 34.7 mmHg (ref 32.0–48.0)
pH, Arterial: 7.466 — ABNORMAL HIGH (ref 7.350–7.450)

## 2018-07-19 LAB — URINE CULTURE: CULTURE: NO GROWTH

## 2018-07-19 LAB — MAGNESIUM
MAGNESIUM: 1.7 mg/dL (ref 1.7–2.4)
MAGNESIUM: 2.1 mg/dL (ref 1.7–2.4)
Magnesium: 2.3 mg/dL (ref 1.7–2.4)

## 2018-07-19 LAB — PROTIME-INR
INR: 2.39
Prothrombin Time: 25.9 seconds — ABNORMAL HIGH (ref 11.4–15.2)

## 2018-07-19 LAB — PHOSPHORUS
PHOSPHORUS: 3.3 mg/dL (ref 2.5–4.6)
Phosphorus: <1 mg/dL — CL (ref 2.5–4.6)
Phosphorus: 2 mg/dL — ABNORMAL LOW (ref 2.5–4.6)

## 2018-07-19 MED ORDER — VANCOMYCIN HCL IN DEXTROSE 750-5 MG/150ML-% IV SOLN: 750.0000 mg | Freq: Two times a day (BID) | INTRAVENOUS | Status: DC

## 2018-07-19 MED ORDER — ADULT MULTIVITAMIN LIQUID CH
15.0000 mL | Freq: Every day | ORAL | Status: DC
  Administered 2018-07-19 – 2018-07-23 (×5): 15 mL via ORAL
  Filled 2018-07-19 (×5): qty 15

## 2018-07-19 MED ORDER — SODIUM CHLORIDE 0.9 % IV SOLN
1.0000 g | Freq: Three times a day (TID) | INTRAVENOUS | Status: DC
  Administered 2018-07-19 – 2018-07-20 (×3): 1 g via INTRAVENOUS
  Filled 2018-07-19 (×3): qty 1

## 2018-07-19 MED ORDER — SODIUM PHOSPHATES 45 MMOLE/15ML IV SOLN
30.0000 mmol | Freq: Once | INTRAVENOUS | Status: AC
  Administered 2018-07-19: 30 mmol via INTRAVENOUS
  Filled 2018-07-19: qty 10

## 2018-07-19 MED ORDER — VITAL HIGH PROTEIN PO LIQD
1000.0000 mL | ORAL | Status: DC
  Administered 2018-07-19 (×2): 1000 mL

## 2018-07-19 MED ORDER — THIAMINE HCL 100 MG/ML IJ SOLN
100.0000 mg | Freq: Every day | INTRAMUSCULAR | Status: DC
  Administered 2018-07-19 – 2018-07-20 (×2): 100 mg via INTRAVENOUS
  Filled 2018-07-19 (×3): qty 2

## 2018-07-19 MED ORDER — POTASSIUM CHLORIDE 10 MEQ/50ML IV SOLN
INTRAVENOUS | Status: AC
  Filled 2018-07-19: qty 300

## 2018-07-19 MED ORDER — MAGNESIUM SULFATE 2 GM/50ML IV SOLN
2.0000 g | Freq: Once | INTRAVENOUS | Status: AC
  Administered 2018-07-19: 2 g via INTRAVENOUS
  Filled 2018-07-19: qty 50

## 2018-07-19 MED ORDER — POTASSIUM CHLORIDE 10 MEQ/50ML IV SOLN
10.0000 meq | INTRAVENOUS | Status: AC
  Administered 2018-07-19 (×6): 10 meq via INTRAVENOUS

## 2018-07-19 MED ORDER — FUROSEMIDE 10 MG/ML IJ SOLN
INTRAMUSCULAR | Status: AC
  Filled 2018-07-19: qty 2

## 2018-07-19 MED ORDER — POTASSIUM CHLORIDE 20 MEQ/15ML (10%) PO SOLN
60.0000 meq | Freq: Once | ORAL | Status: AC
  Administered 2018-07-19: 60 meq via ORAL
  Filled 2018-07-19: qty 45

## 2018-07-19 MED ORDER — FUROSEMIDE 10 MG/ML IJ SOLN
20.0000 mg | Freq: Once | INTRAMUSCULAR | Status: AC
  Administered 2018-07-19: 20 mg via INTRAVENOUS
  Filled 2018-07-19: qty 2

## 2018-07-19 MED ORDER — ACETAMINOPHEN 160 MG/5ML PO SOLN
500.0000 mg | Freq: Four times a day (QID) | ORAL | Status: DC | PRN
  Administered 2018-07-19 – 2018-07-23 (×11): 500 mg via ORAL
  Filled 2018-07-19 (×11): qty 20.3

## 2018-07-19 NOTE — Progress Notes (Signed)
CRITICAL VALUE ALERT  Critical Value:  phosphuros ,<1.0  Date & Time Notied:  07/19/18 @ 0505  Provider Notified: eLink   Orders Received/Actions taken:

## 2018-07-19 NOTE — Progress Notes (Addendum)
Madison [name]  WEX:[id number] DOB: 1961-04-15 DOA: August 17, 2018 PCP: Renford Dills, MD    Reason for Consult / Chief Complaint:  Respiratory Distress/sepsis  Consulting MD:  Dr. Bebe Shaggy 8/30  HPI/Brief Narrative   57 year old female with PMH of aortic valve replacement on Coumadin, Depression, HTN, DM, CVA with residual deficits  Recent admission 8/17 s/p Fall with new subacute infarct of the right occipital lobe with edema and mass effect   Presents to ED on 8/29 with nausea/vomiting and upper abdominal pain. CT A/P with thickened edematous distal esophagus concerning for esophagitis. Remaining exam negative and hemodynamically stable. Discharged back to SNF. On 8/30 at 0300 patient was noted to be obtunded, febrile, and tachycardiac. Sent back to ED. On arrival patient noted to be hyperglycemic with glucose >700, ketones in urine. Given 2 amps of Bicarb. Upon assessment patient minimally responsive and vomiting dark emesis, became hypoxic and was emergently intubated. LA 9. WBC 34.0. Temp 101. PCCM asked to admit   Subjective  Patient appears comfortable on mechanical ventilation. Seen on rounds this AM. Care discussed with nursing at bedside.   Assessment & Plan:   Acute hypoxemic respiratory failure requiring intubation mechanical ventilation Septic Shock, possible aspiration pneumonia, chest x-ray with bilateral infiltrate Bilateral pulmonary vascular congestion,/pulmonary edema, positive cumulative fluid balance, +2.1 L Hx of E. Coli UTI Anion Gap Metabolic Acidosis secondary to DKA/Lactic Acidosis, improving  Acute Metabolic Encephalopathy H/O Depression, CVA with residual deficits, recent right occipital lobe infarct  Electrolyte abnormalities: Severe hypophosphatemia Hypomagnesemia Hypokalemia  Bloody OG tube output, possible GI bleed secondary to gastritis/possible Mallory-Weiss  Plan:  Continue sedation and mechanical ventilation for full support.  Decreased  sedation this morning to attempt SBT later this afternoon.  Currently at fentanyl 75 mics per hour.  With RA SS goal of 0 to -1.  Continue current vent support and pressure regulated volume control/5/24.  Shock has improved, no longer requiring vasopressor use.  Glucose has improved no longer requiring insulin drip, continue every 4 hours SSI  I replacement has already been started.  Will repeat as soon as current replacements have been given.  Will need to ensure normal phosphate level prior to initiating tube feeds.  clamp OG tube to see if she tolerates.  No additional bloody suction at this time.  Continue acid suppression, appreciate GI input  Stop vancomycin, stop Flagyl, continue cefepime for the time being Await final culture results Likely de-escalate to Unasyn possibly tomorrow  We will give 1 dose Lasix 20 mg IV, positive cumulative fluid balance  We will start thiamine and multivitamin  Most importantly today we need to correct her electrolytes and start trickle tube feeding and continue to monitor electrolytes.   Best Practice / Goals of Care / Disposition.   DVT prophylaxis: SCDs, holding chemical d/t gi bleeding and elevated INR  GI prophylaxis: PO propanozole 40mg  qd  Diet: NPO Mobility: bedrest  Code Status: Full Family Communication: Family not at bedside today. Will contact sister.   Disposition / Summary of Today's Plan 07/19/18    Consultants:  Gastroenterology 8/30  Procedures: 8/30 CVC >>  Significant Diagnostic Tests:  8/30 CT head: previous infarcts of b/l PCA, MCA region with no acute abnormalities   Micro Data: Urine Cx 8/17 >> E. Coli  Urine Cx 8/30 >> pending Blood Cxx2 >>pending   Antimicrobials:  Cefepime 8/30 >> Vanc 8/30 >>  Flagyl 8/30 >>    Objective    Examination: General appearance: 57  y.o., female, intubated on mechanical ventilation, appears comfortable, opens eyes to voice Eyes: anicteric sclerae, moist conjunctivae;  tracking appropriately HENT: NCAT; oropharynx, MMM Neck: Trachea midline; no JVD  lungs: Diminished bilaterally, bilateral anterior rhonchi, bilateral ventilated breath sounds CV: RRR, S1, S2, click present Abdomen: Soft, non-tender; non-distended, BS present  Extremities: No peripheral edema or radial and DP pulses present bilaterally  Skin: Normal temperature, turgor and texture; no rash Psych: Unable to assess, however no evidence of active delirium Neuro: Able to give thumbs up, follow basic commands, left-sided weak muscle strength as compared to right.  This is her baseline known history of CVA.   Blood pressure (!) 96/46, pulse 91, temperature 99.9 F (37.7 C), resp. rate (!) 24, height 5\' 2"  (1.575 m), weight 83.2 kg, SpO2 98 %.   . sodium chloride 250 mL (06-Aug-2018 2320)  . ceFEPime (MAXIPIME) IV    . dextrose 5 % and 0.45% NaCl 100 mL/hr at 07/19/18 0355  . fentaNYL infusion INTRAVENOUS 300 mcg/hr (07/19/18 0559)  . lactated ringers    . metronidazole 500 mg (07/19/18 0351)  . sodium phosphate  Dextrose 5% IVPB 30 mmol (07/19/18 0651)  . vancomycin     CVP:  [9 mmHg] 9 mmHg  Vent Mode: PRVC FiO2 (%):  [30 %-40 %] 30 % Set Rate:  [24 bmp] 24 bmp Vt Set:  [400 mL] 400 mL PEEP:  [5 cmH20] 5 cmH20 Plateau Pressure:  [16 cmH20-20 cmH20] 20 cmH20   Intake/Output Summary (Last 24 hours) at 07/19/2018 1051 Last data filed at 07/19/2018 0600 Gross per 24 hour  Intake 2967.79 ml  Output 1445 ml  Net 1522.79 ml   Filed Weights   August 06, 2018 0340 07/19/18 0355  Weight: 80 kg 83.2 kg    Labs   CBC: Recent Labs  Lab 07/17/18 1727 2018/08/06 0342 August 06, 2018 0611 08/06/18 1132 07/19/18 0345  WBC 18.1* 34.0*  --  26.4* 17.2*  NEUTROABS  --  28.9*  --  23.7* 13.3*  HGB 12.9 12.0 11.9* 11.0* 9.1*  HCT 38.2 43.0 35.0* 33.9* 28.4*  MCV 87.8 103.1*  --  90.6 91.6  PLT 499* 538*  --  415* 332   Basic Metabolic Panel: Recent Labs  Lab 06-Aug-2018 1031 08-06-18 1354 08-06-18 1845  08/06/2018 2132 07/19/18 0345  NA 147* 149* 147* 146* 146*  K 3.5 3.4* 4.2 4.5 3.3*  CL 108 111 109 110 111  CO2 22 30 27 27 27   GLUCOSE 332* 172* 218* 210* 174*  BUN 23* 21* 19 19 17   CREATININE 1.89* 1.40* 1.37* 1.27* 1.13*  CALCIUM 8.6* 8.5* 8.2* 8.1* 8.1*  MG  --   --   --   --  1.7  PHOS  --   --   --   --  <1.0*   GFR: Estimated Creatinine Clearance: 54.9 mL/min (A) (by C-G formula based on SCr of 1.13 mg/dL (H)). Recent Labs  Lab 07/17/18 1727 Aug 06, 2018 0342 2018/08/06 0439 08/06/2018 0554 Aug 06, 2018 0609 2018-08-06 1031 Aug 06, 2018 1132 August 06, 2018 2132 07/19/18 0345  PROCALCITON  --   --   --  5.61  --   --   --   --  8.84  WBC 18.1* 34.0*  --   --   --   --  26.4*  --  17.2*  LATICACIDVEN  --   --  9.78*  --  6.9* 6.6*  --  1.7  --    Liver Function Tests: Recent Labs  Lab 07/17/18 1727 2018/08/06  0342  AST 18 27  ALT 15 10  ALKPHOS 108 111  BILITOT 1.2 2.3*  PROT 7.5 6.9  ALBUMIN 3.6 3.4*   Recent Labs  Lab 07/17/18 1727  LIPASE 18   No results for input(s): AMMONIA in the last 168 hours. ABG    Component Value Date/Time   PHART 7.466 (H) 07/19/2018 0434   PCO2ART 34.7 07/19/2018 0434   PO2ART 52.0 (L) 07/19/2018 0434   HCO3 25.0 07/19/2018 0434   TCO2 26 07/19/2018 0434   ACIDBASEDEF 9.1 (H) 07/03/2018 0805   O2SAT 88.0 07/19/2018 0434    Coagulation Profile: Recent Labs  Lab 07/17/2018 0342  INR 2.78   Cardiac Enzymes: Recent Labs  Lab 06/28/2018 1031  TROPONINI <0.03   HbA1C: Hemoglobin A1C  Date/Time Value Ref Range Status  10/01/2017 10:25 AM 9.8  Final  07/01/2017 02:33 PM 9.0  Final   Hgb A1c MFr Bld  Date/Time Value Ref Range Status  07/06/2018 03:29 AM 8.5 (H) 4.8 - 5.6 % Final    Comment:    (NOTE) Pre diabetes:          5.7%-6.4% Diabetes:              >6.4% Glycemic control for   <7.0% adults with diabetes   10/22/2016 03:17 AM 13.0 (H) 4.8 - 5.6 % Final    Comment:    (NOTE)         Pre-diabetes: 5.7 - 6.4         Diabetes:  >6.4         Glycemic control for adults with diabetes: <7.0    CBG: Recent Labs  Lab 07/12/2018 1844 07/11/2018 1946 07/15/2018 2321 07/19/18 0326 07/19/18 0741  GLUCAP 197* 190* 215* 145* 172*    Chest imaging: Chest x-ray with bilateral infiltrate. The patient's images have been independently reviewed by me.     LOS: 1 day   This patient is critically ill with multiple organ system failure; which, requires frequent high complexity decision making, assessment, support, evaluation, and titration of therapies. This was completed through the application of advanced monitoring technologies and extensive interpretation of multiple databases. During this encounter critical care time was devoted to patient care services described in this note for 40 minutes.   Josephine Igo, DO [facility] 07/19/2018 10:51 AM  Personal pager: [phone] If unanswered, please page CCM On-call: #[phone]

## 2018-07-19 NOTE — Progress Notes (Signed)
Pharmacy Antibiotic Note  Madison [name] is a 57 y.o. female admitted on 07/11/2018 with sepsis.  Pharmacy has been consulted for vancomycin and cefepime dosing. Pt with Tmax 99.9 and WBC is elevated but trending down. SCr is also trending down. Cultures are negative so far.   Plan: Change vancomycin to 750mg  IV Q12H Change cefepime to 1gm IV Q8H F/u renal fxn, C&S, clinical status and trough at SS  Height: 5\' 2"  (157.5 cm) Weight: 183 lb 6.8 oz (83.2 kg) IBW/kg (Calculated) : 50.1  Temp (24hrs), Avg:99.1 F (37.3 C), Min:98.4 F (36.9 C), Max:99.9 F (37.7 C)  Recent Labs  Lab 07/17/18 1727 07/17/2018 0342 06/21/2018 0439 07/04/2018 0609  07/01/2018 1031 07/05/2018 1132 07/14/2018 1354 06/27/2018 1845 06/29/2018 2132 07/19/18 0345  WBC 18.1* 34.0*  --   --   --   --  26.4*  --   --   --  17.2*  CREATININE 1.54* 2.38*  --   --    < > 1.89*  --  1.40* 1.37* 1.27* 1.13*  LATICACIDVEN  --   --  9.78* 6.9*  --  6.6*  --   --   --  1.7  --    < > = values in this interval not displayed.    Estimated Creatinine Clearance: 54.9 mL/min (A) (by C-G formula based on SCr of 1.13 mg/dL (H)).    Allergies  Allergen Reactions  . Tape Other (See Comments)    Can burn skin if left on too long. Reaction:Burning  Can burn skin if left on too long.  . Celebrex [Celecoxib] Rash  . Detrol [Tolterodine] Hives    Antimicrobials this admission: Vanc 8/30>> Cefepime 8/30>> Flagyl 8/30>>  Microbiology results: 8/30 urince cx - NEG 8/30 MRSA - NEG  Thank you for allowing pharmacy to be a part of this patient's care.  Altheria Shadoan, 9/30 07/19/2018 10:18 AM

## 2018-07-20 DIAGNOSIS — J690 Pneumonitis due to inhalation of food and vomit: Secondary | ICD-10-CM

## 2018-07-20 LAB — GLUCOSE, CAPILLARY
GLUCOSE-CAPILLARY: 130 mg/dL — AB (ref 70–99)
GLUCOSE-CAPILLARY: 334 mg/dL — AB (ref 70–99)
GLUCOSE-CAPILLARY: 111 mg/dL — AB (ref 70–99)
Glucose-Capillary: 138 mg/dL — ABNORMAL HIGH (ref 70–99)
Glucose-Capillary: 161 mg/dL — ABNORMAL HIGH (ref 70–99)
Glucose-Capillary: 413 mg/dL — ABNORMAL HIGH (ref 70–99)

## 2018-07-20 LAB — BASIC METABOLIC PANEL
Anion gap: 6 (ref 5–15)
BUN: 12 mg/dL (ref 6–20)
CHLORIDE: 112 mmol/L — AB (ref 98–111)
CO2: 27 mmol/L (ref 22–32)
Calcium: 8.2 mg/dL — ABNORMAL LOW (ref 8.9–10.3)
Creatinine, Ser: 1.03 mg/dL — ABNORMAL HIGH (ref 0.44–1.00)
GFR calc Af Amer: >60 mL/min (ref >60)
GFR calc non Af Amer: 59 mL/min — ABNORMAL LOW (ref >60)
GLUCOSE: 182 mg/dL — AB (ref 70–99)
POTASSIUM: 4.5 mmol/L (ref 3.5–5.1)
Sodium: 145 mmol/L (ref 135–145)

## 2018-07-20 LAB — CBC WITH DIFFERENTIAL/PLATELET
Abs Immature Granulocytes: 0.1 10*3/uL (ref 0.0–0.1)
BASOS ABS: 0 10*3/uL (ref 0.0–0.1)
Basophils Relative: 0 %
EOS ABS: 0.1 10*3/uL (ref 0.0–0.7)
EOS PCT: 1 %
HCT: 30.6 % — ABNORMAL LOW (ref 36.0–46.0)
HEMOGLOBIN: 9.4 g/dL — AB (ref 12.0–15.0)
Immature Granulocytes: 1 %
LYMPHS PCT: 19 %
Lymphs Abs: 2.3 10*3/uL (ref 0.7–4.0)
MCH: 29.1 pg (ref 26.0–34.0)
MCHC: 30.7 g/dL (ref 30.0–36.0)
MCV: 94.7 fL (ref 78.0–100.0)
MONO ABS: 0.5 10*3/uL (ref 0.1–1.0)
Monocytes Relative: 4 %
Neutro Abs: 9.3 10*3/uL — ABNORMAL HIGH (ref 1.7–7.7)
Neutrophils Relative %: 75 %
Platelets: 322 10*3/uL (ref 150–400)
RBC: 3.23 MIL/uL — ABNORMAL LOW (ref 3.87–5.11)
RDW: 14.6 % (ref 11.5–15.5)
WBC: 12.4 10*3/uL — ABNORMAL HIGH (ref 4.0–10.5)

## 2018-07-20 LAB — PROTIME-INR
INR: 1.61
PROTHROMBIN TIME: 19 s — AB (ref 11.4–15.2)

## 2018-07-20 LAB — HEPARIN LEVEL (UNFRACTIONATED): HEPARIN UNFRACTIONATED: 0.68 [IU]/mL (ref 0.30–0.70)

## 2018-07-20 LAB — MAGNESIUM: Magnesium: 2.2 mg/dL (ref 1.7–2.4)

## 2018-07-20 LAB — PHOSPHORUS: Phosphorus: 2 mg/dL — ABNORMAL LOW (ref 2.5–4.6)

## 2018-07-20 MED ORDER — PANTOPRAZOLE SODIUM 40 MG IV SOLR
40.0000 mg | Freq: Every day | INTRAVENOUS | Status: DC
  Administered 2018-07-21 – 2018-07-23 (×3): 40 mg via INTRAVENOUS
  Filled 2018-07-20 (×3): qty 40

## 2018-07-20 MED ORDER — INSULIN DETEMIR 100 UNIT/ML ~~LOC~~ SOLN
10.0000 [IU] | Freq: Every day | SUBCUTANEOUS | Status: DC
  Administered 2018-07-20 – 2018-07-22 (×3): 10 [IU] via SUBCUTANEOUS
  Filled 2018-07-20 (×3): qty 0.1

## 2018-07-20 MED ORDER — VITAL HIGH PROTEIN PO LIQD
1000.0000 mL | ORAL | Status: DC
  Administered 2018-07-20 – 2018-07-21 (×2): 1000 mL

## 2018-07-20 MED ORDER — DEXMEDETOMIDINE HCL IN NACL 400 MCG/100ML IV SOLN
0.4000 ug/kg/h | INTRAVENOUS | Status: DC
  Administered 2018-07-20: 0.6 ug/kg/h via INTRAVENOUS
  Administered 2018-07-20 – 2018-07-22 (×4): 0.4 ug/kg/h via INTRAVENOUS
  Administered 2018-07-23: 0.5 ug/kg/h via INTRAVENOUS
  Filled 2018-07-20 (×6): qty 100

## 2018-07-20 MED ORDER — PRO-STAT SUGAR FREE PO LIQD
30.0000 mL | Freq: Two times a day (BID) | ORAL | Status: DC
  Administered 2018-07-20 – 2018-07-21 (×3): 30 mL
  Filled 2018-07-20 (×2): qty 30

## 2018-07-20 MED ORDER — VITAMIN B-1 100 MG PO TABS
100.0000 mg | ORAL_TABLET | Freq: Every day | ORAL | Status: DC
  Administered 2018-07-21 – 2018-07-23 (×3): 100 mg via ORAL
  Filled 2018-07-20 (×3): qty 1

## 2018-07-20 MED ORDER — SODIUM CHLORIDE 0.9 % IV SOLN: 1.0000 g | Freq: Two times a day (BID) | INTRAVENOUS | Status: DC

## 2018-07-20 MED ORDER — INSULIN DETEMIR 100 UNIT/ML ~~LOC~~ SOLN: 10.0000 [IU] | Freq: Two times a day (BID) | SUBCUTANEOUS | Status: DC

## 2018-07-20 MED ORDER — HEPARIN (PORCINE) IN NACL 100-0.45 UNIT/ML-% IJ SOLN
950.0000 [IU]/h | INTRAMUSCULAR | Status: DC
  Administered 2018-07-20: 1000 [IU]/h via INTRAVENOUS
  Administered 2018-07-21 – 2018-07-23 (×3): 950 [IU]/h via INTRAVENOUS
  Filled 2018-07-20 (×4): qty 250

## 2018-07-20 MED ORDER — FUROSEMIDE 10 MG/ML IJ SOLN
40.0000 mg | Freq: Four times a day (QID) | INTRAMUSCULAR | Status: AC
  Administered 2018-07-20 (×2): 40 mg via INTRAVENOUS
  Filled 2018-07-20 (×2): qty 4

## 2018-07-20 MED ORDER — INSULIN ASPART 100 UNIT/ML ~~LOC~~ SOLN
0.0000 [IU] | SUBCUTANEOUS | Status: DC
  Administered 2018-07-20: 20 [IU] via SUBCUTANEOUS
  Administered 2018-07-20 – 2018-07-21 (×4): 15 [IU] via SUBCUTANEOUS
  Administered 2018-07-21: 7 [IU] via SUBCUTANEOUS
  Administered 2018-07-21: 4 [IU] via SUBCUTANEOUS
  Administered 2018-07-22: 15 [IU] via SUBCUTANEOUS
  Administered 2018-07-22 (×2): 3 [IU] via SUBCUTANEOUS
  Administered 2018-07-22: 4 [IU] via SUBCUTANEOUS
  Administered 2018-07-22: 3 [IU] via SUBCUTANEOUS
  Administered 2018-07-23: 4 [IU] via SUBCUTANEOUS
  Administered 2018-07-23: 7 [IU] via SUBCUTANEOUS

## 2018-07-20 MED ORDER — SODIUM CHLORIDE 0.9 % IV SOLN
1.0000 g | Freq: Two times a day (BID) | INTRAVENOUS | Status: AC
  Administered 2018-07-20 – 2018-07-22 (×6): 1 g via INTRAVENOUS
  Filled 2018-07-20 (×6): qty 10

## 2018-07-20 NOTE — Progress Notes (Signed)
ANTICOAGULATION CONSULT NOTE  Pharmacy Consult for heparin Indication: mechanical valve  Allergies  Allergen Reactions  . Tape Other (See Comments)    Can burn skin if left on too long. Reaction:Burning  Can burn skin if left on too long.  . Celebrex [Celecoxib] Rash  . Detrol [Tolterodine] Hives    Patient Measurements: Height: 5\' 2"  (157.5 cm) Weight: 189 lb 2.5 oz (85.8 kg) IBW/kg (Calculated) : 50.1 Heparin Dosing Weight: 69kg  Vital Signs: Temp: 101.8 F (38.8 C) (09/01 1800) BP: 130/58 (09/01 1800) Pulse Rate: 109 (09/01 1800)  Labs: Recent Labs    07/07/2018 0342  07/19/2018 1031 07/09/2018 1132  07/19/18 0345 07/19/18 1057 07/19/18 1330 07/19/18 2258 07/20/18 0401 07/20/18 1752  HGB 12.0   < >  --  11.0*  --  9.1*  --   --   --  9.4*  --   HCT 43.0   < >  --  33.9*  --  28.4*  --   --   --  30.6*  --   PLT 538*  --   --  415*  --  332  --   --   --  322  --   LABPROT 29.1*  --   --   --   --   --  25.9*  --   --  19.0*  --   INR 2.78  --   --   --   --   --  2.39  --   --  1.61  --   HEPARINUNFRC  --   --   --   --   --   --   --   --   --   --  0.68  CREATININE 2.38*   < > 1.89*  --    < > 1.13*  --  1.06* 1.02* 1.03*  --   TROPONINI  --   --  <0.03  --   --   --   --   --   --   --   --    < > = values in this interval not displayed.    Estimated Creatinine Clearance: 61.3 mL/min (A) (by C-G formula based on SCr of 1.03 mg/dL (H)).  Medications:  Infusions:  . sodium chloride 10 mL/hr at 07/20/18 0600  . cefTRIAXone (ROCEPHIN)  IV Stopped (07/20/18 1156)  . dexmedetomidine (PRECEDEX) IV infusion Stopped (07/20/18 1443)  . fentaNYL infusion INTRAVENOUS 100 mcg/hr (07/20/18 1800)  . heparin 1,000 Units/hr (07/20/18 1800)    Assessment: Madison [name] on chronic warfarin for history of mechanical valve. Warfarin initially held and reversed with a small dose of vitamin K due to possible GIB. INR today is now subtherapeutic at 1.6. GIB mostly resolved at this  point so IV heparin was started. Initial heparin level is therapeutic but at the upper end of goal range. Due to recent bleeding, will reduce dose slightly to maintain in goal range.  No new bleeding noted.    Goal of Therapy:  Heparin level 0.3-0.7 units/ml Monitor platelets by anticoagulation protocol: Yes   Plan:  Reduce heparin gtt to 950 units F/u AM heparin level Daily heparin level and CBC F/u restart warfarin  Melissaann Dizdarevic, 09/19/18 07/20/2018,7:02 PM

## 2018-07-20 NOTE — Progress Notes (Signed)
ANTICOAGULATION CONSULT NOTE - Initial Consult  Pharmacy Consult for heparin Indication: mechanical valve  Allergies  Allergen Reactions  . Tape Other (See Comments)    Can burn skin if left on too long. Reaction:Burning  Can burn skin if left on too long.  . Celebrex [Celecoxib] Rash  . Detrol [Tolterodine] Hives    Patient Measurements: Height: 5\' 2"  (157.5 cm) Weight: 189 lb 2.5 oz (85.8 kg) IBW/kg (Calculated) : 50.1 Heparin Dosing Weight: 69kg  Vital Signs: Temp: 100.6 F (38.1 C) (09/01 0830) Temp Source: Bladder (09/01 0400) BP: 109/52 (09/01 0837) Pulse Rate: 94 (09/01 0837)  Labs: Recent Labs    07/13/2018 0342  07/19/2018 1031 07/12/2018 1132  07/19/18 0345 07/19/18 1057 07/19/18 1330 07/19/18 2258 07/20/18 0401  HGB 12.0   < >  --  11.0*  --  9.1*  --   --   --  9.4*  HCT 43.0   < >  --  33.9*  --  28.4*  --   --   --  30.6*  PLT 538*  --   --  415*  --  332  --   --   --  322  LABPROT 29.1*  --   --   --   --   --  25.9*  --   --  19.0*  INR 2.78  --   --   --   --   --  2.39  --   --  1.61  CREATININE 2.38*   < > 1.89*  --    < > 1.13*  --  1.06* 1.02* 1.03*  TROPONINI  --   --  <0.03  --   --   --   --   --   --   --    < > = values in this interval not displayed.    Estimated Creatinine Clearance: 61.3 mL/min (A) (by C-G formula based on SCr of 1.03 mg/dL (H)).   Medical History: Past Medical History:  Diagnosis Date  . Allergy   . Anxiety   . Aortic stenosis   . CAD (coronary artery disease)   . CHF (congestive heart failure) (HCC)   . Depression   . Diabetes (HCC) dx'd 1982   "went straight to insulin"  . Dyslipidemia   . GERD (gastroesophageal reflux disease)   . Heart murmur   . HTN (hypertension)   . Hypercholesteremia   . Myocardial infarction (HCC) 12/2014  . Obesity   . Pneumonia 11/2014; 12/2014  . Rheumatoid arthritis(714.0)    "hands" (07/01/2015)  . Stroke syndrome 1995; 2001   "when my son was born; problems w/speech and L  hand since then" (01/19/2015)  . Thrombophlebitis     Medications:  Infusions:  . sodium chloride 10 mL/hr at 07/20/18 0600  . ceFEPime (MAXIPIME) IV 1 g (07/20/18 0924)  . dexmedetomidine (PRECEDEX) IV infusion Stopped (07/20/18 0630)  . fentaNYL infusion INTRAVENOUS 25 mcg/hr (07/20/18 0800)  . heparin      Assessment: 57 yof on chronic warfarin for history of mechanical valve. Warfarin initially held and reversed with a small dose of vitamin K due to possible GIB. INR today is now subtherapeutic at 1.6. GIB mostly resolved at this point. Will start IV heparin for now. Hgb low at 9.4 and platelets are WNL.   Goal of Therapy:  Heparin level 0.3-0.7 units/ml Monitor platelets by anticoagulation protocol: Yes   Plan:  Heparin gtt 1000 units/hr - no bolus for now  d/t recent GIB Check an 8 hr heparin level Daily heparin level and CBC F/u restart warfarin  Katesha Eichel, Drake Leach 07/20/2018,9:48 AM

## 2018-07-20 NOTE — Progress Notes (Signed)
Sister - Elvera Bicker [place], [place] # [phone]  Mom - Lynnae January, Cell # [phone]

## 2018-07-20 NOTE — Progress Notes (Signed)
RT note:  Attempted SBT on patient this AM on CPAP/PSV of 15/5.  Patient sats began to slowly drop and spontaneous tidal volumes were in the high 200s.  Placed patient back on full support and sats improved to 98%.  Will continue to monitor.

## 2018-07-20 NOTE — Progress Notes (Signed)
Madison [name]  HFW:[id number] DOB: 1961-04-04 DOA: 08/10/18 PCP: Renford Dills, MD    Reason for Consult / Chief Complaint:  Respiratory Distress/sepsis  Consulting MD:  Dr. Bebe Shaggy 8/30  HPI/Brief Narrative   57 year old female with PMH of aortic valve replacement on Coumadin, Depression, HTN, DM, CVA with residual deficits  Recent admission 8/17 s/p Fall with new subacute infarct of the right occipital lobe with edema and mass effect   Presents to ED on 8/29 with nausea/vomiting and upper abdominal pain. CT A/P with thickened edematous distal esophagus concerning for esophagitis. Remaining exam negative and hemodynamically stable. Discharged back to SNF. On 8/30 at 0300 patient was noted to be obtunded, febrile, and tachycardiac. Sent back to ED. On arrival patient noted to be hyperglycemic with glucose >700, ketones in urine. Given 2 amps of Bicarb. Upon assessment patient minimally responsive and vomiting dark emesis, became hypoxic and was emergently intubated. LA 9. WBC 34.0. Temp 101. PCCM asked to admit   Subjective  Doing well this morning. Did not tolerate SBT. She became hypoxemic gradually.  And more tachypneic.  SBT results were discussed with respiratory therapy.  And of care discussed with nursing staff at bedside this morning.  Family at bedside to include mother and sister.  Answered all questions.  They were grateful for care.  Assessment & Plan:   Acute hypoxemic respiratory failure requiring intubation mechanical ventilation Septic Shock, possible aspiration pneumonia, chest x-ray with bilateral infiltrate Bilateral pulmonary vascular congestion,/pulmonary edema, positive cumulative fluid balance Hx of E. Coli UTI Anion Gap Metabolic Acidosis secondary to DKA/Lactic Acidosis, improving  Acute Metabolic Encephalopathy H/O Depression, CVA with residual deficits, recent right occipital lobe infarct  Electrolyte abnormalities: Hypophosphatemia, Hypomagnesemia,  Hypokalemia, improved  Stress-induced GI bleeding.  Hyperglycemia, type 2 diabetes  Plan:  Attempted SBT this morning with decreased sedation.  Patient was unable to tolerate pressure support for very long time as she become more tachypneic and hypoxemic.  We will continue mild sedation with a goal RA SS 2 0 to -1 and placed back in full support on assist control ventilation.  CBGs every 4 hours with SSI, Will increase sliding scale insulin dose as well as increased Levemir dose to 10 units once daily.   Will start heparin drip as patient was on Coumadin for history of stroke with atrial fibrillation.  Continue to monitor PTT per pharmacy protocol.  Appreciate pharmacy's recommendations and management regarding following PTT and heparin administration.  Will follow H&H and observe for any additional bleeding.  Will decrease PPI to once daily from twice daily dosing  Will start full dose tube feeds with PEPUP protocol, continue oral thiamine and multivitamin Observe electrolytes while starting feeding.  Replete as needed.  We will de-escalate antimicrobials to ceftriaxone to complete 3 days.  We will give 2 additional doses of IV Lasix today and follow urine output to attempt to maintain euvolemic status.   Best Practice / Goals of Care / Disposition.   DVT prophylaxis: SCDs, Heparin ggt  GI prophylaxis: PO propanozole 40mg  qd  Diet: NPO Mobility: bedrest  Code Status: Full Family Communication: Family not at bedside today. Will contact sister.   Disposition / Summary of Today's Plan 07/20/18    Consultants:  Gastroenterology 8/30  Procedures: 8/30 CVC >>  Significant Diagnostic Tests:  8/30 CT head: previous infarcts of b/l PCA, MCA region with no acute abnormalities   Micro Data: Urine Cx 8/17 >> E. Coli  Urine Cx 8/30 >>  pending Blood Cxx2 >>pending   Antimicrobials:  Cefepime 8/30 >> Vanc 8/30 >>  Flagyl 8/30 >>    Objective    Examination: General  appearance: 57 y.o., female, intubated, on mechanical ventilation, no apparent distress. Eyes: anicteric sclerae, moist conjunctivae; tracking appropriately HENT: NCAT; oropharynx, MMM, ET tube in place Neck: Trachea midline; no JVD Lungs: Bilateral ventilated breath sounds CV: RRR, S1, S2, distant heart tones, click present Abdomen: Soft, non-tender; non-distended, BS present  Extremities: Dependent edema in the flanks in the upper extremities bilaterally Skin: Normal temperature, turgor and texture; no rash Psych: Unable to fully assess, some agitation suggestive of mild delirium unclear at this time. Neuro: Intubated on mechanical ventilation, following commands, does have weak upper extremity and left, known history of CVA.  Blood pressure (!) 123/53, pulse (!) 109, temperature (!) 100.9 F (38.3 C), resp. rate (!) 23, height 5\' 2"  (1.575 m), weight 85.8 kg, SpO2 98 %.   . sodium chloride 10 mL/hr at 07/20/18 0600  . ceFEPime (MAXIPIME) IV Stopped (07/20/18 0954)  . dexmedetomidine (PRECEDEX) IV infusion Stopped (07/20/18 0630)  . fentaNYL infusion INTRAVENOUS 25 mcg/hr (07/20/18 1000)  . heparin 1,000 Units/hr (07/20/18 1022)   CVP:  [9 mmHg] 9 mmHg  Vent Mode: PRVC FiO2 (%):  [30 %] 30 % Set Rate:  [24 bmp] 24 bmp Vt Set:  [400 mL] 400 mL PEEP:  [5 cmH20] 5 cmH20 Plateau Pressure:  [17 cmH20-23 cmH20] 22 cmH20   Intake/Output Summary (Last 24 hours) at 07/20/2018 1039 Last data filed at 07/20/2018 1000 Gross per 24 hour  Intake 1881.02 ml  Output 1530 ml  Net 351.02 ml   Filed Weights   06/19/2018 0340 07/19/18 0355 07/20/18 0444  Weight: 80 kg 83.2 kg 85.8 kg    Labs   CBC: Recent Labs  Lab 07/17/18 1727 07/17/2018 0342 06/23/2018 0611 07/17/2018 1132 07/19/18 0345 07/20/18 0401  WBC 18.1* 34.0*  --  26.4* 17.2* 12.4*  NEUTROABS  --  28.9*  --  23.7* 13.3* 9.3*  HGB 12.9 12.0 11.9* 11.0* 9.1* 9.4*  HCT 38.2 43.0 35.0* 33.9* 28.4* 30.6*  MCV 87.8 103.1*  --  90.6 91.6  94.7  PLT 499* 538*  --  415* 332 322   Basic Metabolic Panel: Recent Labs  Lab 07/19/2018 2132 07/19/18 0345 07/19/18 1330 07/19/18 2258 07/20/18 0401  NA 146* 146* 146* 143 145  K 4.5 3.3* 2.9* 4.8 4.5  CL 110 111 109 110 112*  CO2 27 27 27 26 27   GLUCOSE 210* 174* 182* 161* 182*  BUN 19 17 12 12 12   CREATININE 1.27* 1.13* 1.06* 1.02* 1.03*  CALCIUM 8.1* 8.1* 8.1* 8.1* 8.2*  MG  --  1.7 2.3 2.1 2.2  PHOS  --  <1.0* 3.3 2.0* 2.0*   GFR: Estimated Creatinine Clearance: 61.3 mL/min (A) (by C-G formula based on SCr of 1.03 mg/dL (H)). Recent Labs  Lab 06/23/2018 0342 07/05/2018 0439 07/01/2018 0554 07/05/2018 0609 06/30/2018 1031 06/27/2018 1132 07/17/2018 2132 07/19/18 0345 07/20/18 0401  PROCALCITON  --   --  5.61  --   --   --   --  8.84  --   WBC 34.0*  --   --   --   --  26.4*  --  17.2* 12.4*  LATICACIDVEN  --  9.78*  --  6.9* 6.6*  --  1.7  --   --    Liver Function Tests: Recent Labs  Lab 07/17/18 1727 06/19/2018 0342  AST 18 27  ALT 15 10  ALKPHOS 108 111  BILITOT 1.2 2.3*  PROT 7.5 6.9  ALBUMIN 3.6 3.4*   Recent Labs  Lab 07/17/18 1727  LIPASE 18   No results for input(s): AMMONIA in the last 168 hours. ABG    Component Value Date/Time   PHART 7.466 (H) 07/19/2018 0434   PCO2ART 34.7 07/19/2018 0434   PO2ART 52.0 (L) 07/19/2018 0434   HCO3 25.0 07/19/2018 0434   TCO2 26 07/19/2018 0434   ACIDBASEDEF 9.1 (H) 07/14/2018 0805   O2SAT 88.0 07/19/2018 0434    Coagulation Profile: Recent Labs  Lab 06/22/2018 0342 07/19/18 1057 07/20/18 0401  INR 2.78 2.39 1.61   Cardiac Enzymes: Recent Labs  Lab 07/03/2018 1031  TROPONINI <0.03   HbA1C: Hemoglobin A1C  Date/Time Value Ref Range Status  10/01/2017 10:25 AM 9.8  Final  07/01/2017 02:33 PM 9.0  Final   Hgb A1c MFr Bld  Date/Time Value Ref Range Status  07/06/2018 03:29 AM 8.5 (H) 4.8 - 5.6 % Final    Comment:    (NOTE) Pre diabetes:          5.7%-6.4% Diabetes:              >6.4% Glycemic  control for   <7.0% adults with diabetes   10/22/2016 03:17 AM 13.0 (H) 4.8 - 5.6 % Final    Comment:    (NOTE)         Pre-diabetes: 5.7 - 6.4         Diabetes: >6.4         Glycemic control for adults with diabetes: <7.0    CBG: Recent Labs  Lab 07/19/18 1623 07/19/18 1959 07/20/18 0008 07/20/18 0357 07/20/18 0759  GLUCAP 188* 158* 138* 161* 130*    Chest imaging:  No new chest imaging this morning.   LOS: 2 days   This patient is critically ill with multiple organ system failure; which, requires frequent high complexity decision making, assessment, support, evaluation, and titration of therapies. This was completed through the application of advanced monitoring technologies and extensive interpretation of multiple databases. During this encounter critical care time was devoted to patient care services described in this note for 35 minutes.   Josephine Igo, DO [facility] 07/20/2018 10:39 AM  Personal pager: [phone] If unanswered, please page CCM On-call: #[phone]

## 2018-07-20 DEATH — deceased

## 2018-07-21 ENCOUNTER — Other Ambulatory Visit: Payer: Self-pay

## 2018-07-21 ENCOUNTER — Inpatient Hospital Stay (HOSPITAL_COMMUNITY): Payer: Medicare Other

## 2018-07-21 DIAGNOSIS — R0681 Apnea, not elsewhere classified: Secondary | ICD-10-CM

## 2018-07-21 DIAGNOSIS — E8770 Fluid overload, unspecified: Secondary | ICD-10-CM

## 2018-07-21 LAB — GLUCOSE, CAPILLARY
GLUCOSE-CAPILLARY: 323 mg/dL — AB (ref 70–99)
GLUCOSE-CAPILLARY: 100 mg/dL — AB (ref 70–99)
GLUCOSE-CAPILLARY: 350 mg/dL — AB (ref 70–99)
Glucose-Capillary: 175 mg/dL — ABNORMAL HIGH (ref 70–99)
Glucose-Capillary: 209 mg/dL — ABNORMAL HIGH (ref 70–99)
Glucose-Capillary: 322 mg/dL — ABNORMAL HIGH (ref 70–99)
Glucose-Capillary: 362 mg/dL — ABNORMAL HIGH (ref 70–99)
Glucose-Capillary: 95 mg/dL (ref 70–99)

## 2018-07-21 LAB — BASIC METABOLIC PANEL
Anion gap: 7 (ref 5–15)
BUN: 20 mg/dL (ref 6–20)
CALCIUM: 8.2 mg/dL — AB (ref 8.9–10.3)
CHLORIDE: 106 mmol/L (ref 98–111)
CO2: 29 mmol/L (ref 22–32)
Creatinine, Ser: 1.01 mg/dL — ABNORMAL HIGH (ref 0.44–1.00)
GFR calc Af Amer: >60 mL/min (ref >60)
GFR calc non Af Amer: >60 mL/min (ref >60)
Glucose, Bld: 336 mg/dL — ABNORMAL HIGH (ref 70–99)
Potassium: 3.3 mmol/L — ABNORMAL LOW (ref 3.5–5.1)
SODIUM: 142 mmol/L (ref 135–145)

## 2018-07-21 LAB — PROTIME-INR
INR: 1.37
Prothrombin Time: 16.7 seconds — ABNORMAL HIGH (ref 11.4–15.2)

## 2018-07-21 LAB — CBC
HEMATOCRIT: 29.1 % — AB (ref 36.0–46.0)
HEMOGLOBIN: 9.1 g/dL — AB (ref 12.0–15.0)
MCH: 29 pg (ref 26.0–34.0)
MCHC: 31.3 g/dL (ref 30.0–36.0)
MCV: 92.7 fL (ref 78.0–100.0)
Platelets: 241 10*3/uL (ref 150–400)
RBC: 3.14 MIL/uL — ABNORMAL LOW (ref 3.87–5.11)
RDW: 13.9 % (ref 11.5–15.5)
WBC: 11.2 10*3/uL — AB (ref 4.0–10.5)

## 2018-07-21 LAB — HEPARIN LEVEL (UNFRACTIONATED): Heparin Unfractionated: 0.4 IU/mL (ref 0.30–0.70)

## 2018-07-21 MED ORDER — POTASSIUM CHLORIDE 20 MEQ/15ML (10%) PO SOLN
40.0000 meq | Freq: Once | ORAL | Status: AC
  Administered 2018-07-21: 40 meq via ORAL
  Filled 2018-07-21: qty 30

## 2018-07-21 MED ORDER — PRO-STAT SUGAR FREE PO LIQD
30.0000 mL | Freq: Every day | ORAL | Status: DC
  Administered 2018-07-22 – 2018-07-23 (×2): 30 mL
  Filled 2018-07-21 (×2): qty 30

## 2018-07-21 MED ORDER — VITAL HIGH PROTEIN PO LIQD
1000.0000 mL | ORAL | Status: DC
  Administered 2018-07-21 – 2018-07-22 (×2): 1000 mL
  Filled 2018-07-21: qty 1000

## 2018-07-21 MED ORDER — FUROSEMIDE 10 MG/ML IJ SOLN
40.0000 mg | Freq: Once | INTRAMUSCULAR | Status: AC
  Administered 2018-07-21: 40 mg via INTRAVENOUS
  Filled 2018-07-21: qty 4

## 2018-07-21 MED ORDER — DEXTROSE-NACL 5-0.45 % IV SOLN
INTRAVENOUS | Status: DC
  Administered 2018-07-21: 40 mL/h via INTRAVENOUS

## 2018-07-21 MED ORDER — ONDANSETRON HCL 4 MG/2ML IJ SOLN
4.0000 mg | Freq: Four times a day (QID) | INTRAMUSCULAR | Status: DC | PRN
  Administered 2018-07-21 – 2018-07-23 (×5): 4 mg via INTRAVENOUS
  Filled 2018-07-21 (×5): qty 2

## 2018-07-21 NOTE — Progress Notes (Signed)
eLink Physician-Brief Progress Note Patient Name: Madison [name] DOB: Aug 20, 1961 MRN: [id number]   Date of Service  07/21/2018  HPI/Events of Note  Nausea - Tube feeds now held. Request for antiemetic. QTc interval = 0.41.   eICU Interventions  Will order 1. Hold tube feeds until patient re-evaluated by rounding team in AM.  2. D5 0.45 NaCl to run IV at 40 mL/hour.  3. Zofran 4 mg IV Q 6 hours PRN. 4. NGT to LIS. 5. Monitor QTc interval Q 6 hours. Notify MD if QTc interval > 500 milliseconds.      Intervention Category Major Interventions: Other:  Haakon Titsworth Dennard Nip 07/21/2018, 2:53 AM

## 2018-07-21 NOTE — Progress Notes (Signed)
Patient vomited bile colored emesis zofran given and tube feeding held but restarted per Dr Tonia Brooms

## 2018-07-21 NOTE — Progress Notes (Signed)
Unable to flush OG tube no ascultation of air checked by 2 RNs . Tube feedings held on nights , I removed existing tube and placed new OG tube and abdominal xray ordered ro placement per protocol

## 2018-07-21 NOTE — Progress Notes (Signed)
ANTICOAGULATION CONSULT NOTE  Pharmacy Consult for Heparin Indication: mechanical valve  Allergies  Allergen Reactions  . Tape Other (See Comments)    Can burn skin if left on too long. Reaction:Burning  Can burn skin if left on too long.  . Celebrex [Celecoxib] Rash  . Detrol [Tolterodine] Hives   Patient Measurements: Height: 5\' 2"  (157.5 cm) Weight: 187 lb 2.7 oz (84.9 kg) IBW/kg (Calculated) : 50.1 Heparin Dosing Weight: 69kg  Vital Signs: Temp: 100.8 F (38.2 C) (09/02 0727) Temp Source: Oral (09/02 0727) BP: 89/51 (09/02 0600) Pulse Rate: 65 (09/02 0600)  Labs: Recent Labs    07/14/2018 1031  07/19/18 0345 07/19/18 1057  07/19/18 2258 07/20/18 0401 07/20/18 1752 07/21/18 0500 07/21/18 0540  HGB  --    < > 9.1*  --   --   --  9.4*  --  9.1*  --   HCT  --    < > 28.4*  --   --   --  30.6*  --  29.1*  --   PLT  --    < > 332  --   --   --  322  --  241  --   LABPROT  --   --   --  25.9*  --   --  19.0*  --   --  16.7*  INR  --   --   --  2.39  --   --  1.61  --   --  1.37  HEPARINUNFRC  --   --   --   --   --   --   --  0.68  --  0.40  CREATININE 1.89*   < > 1.13*  --    < > 1.02* 1.03*  --  1.01*  --   TROPONINI <0.03  --   --   --   --   --   --   --   --   --    < > = values in this interval not displayed.   Estimated Creatinine Clearance: 62.1 mL/min (A) (by C-G formula based on SCr of 1.01 mg/dL (H)).  Assessment: 18 yoF on chronic warfarin for history of mechanical valve. Warfarin initially held and reversed with a small dose of vitamin K due to possible GIB. INR today is now subtherapeutic and GIB mostly resolved so IV heparin was started. Heparin level remains within goal range at 0.4 this AM following rate decrease. Due to recent bleeding, targeting the lower end of goal range. Hgb stable 9.1, pltc WNL. No new bleeding noted.    Goal of Therapy:  Heparin level 0.3-0.7 units/ml Monitor platelets by anticoagulation protocol: Yes   Plan:  Continue  heparin gtt at 950 units Daily heparin level and CBC F/u restart warfarin  Astraea Gaughran N. 58, PharmD PGY2 Infectious Diseases Pharmacy Resident Phone: [phone] 07/21/2018,8:00 AM

## 2018-07-21 NOTE — Progress Notes (Signed)
Nutrition Follow-up  DOCUMENTATION CODES:   Obesity unspecified  INTERVENTION:   Tube Feeding:  Recommend resuming TF Vital High Protein @ 45 ml/hr Pro-Stat 30 mL daily Provides 110 g of protein, 1180 kcals and 907 mL of free water  NUTRITION DIAGNOSIS:   Inadequate oral intake related to acute illness as evidenced by NPO status.  Being addressed via TF   GOAL:   Patient will meet greater than or equal to 90% of their needs, Provide needs based on ASPEN/SCCM guidelines  Progressing  MONITOR:   TF tolerance, Vent status, Labs, Weight trends, Skin  REASON FOR ASSESSMENT:   Ventilator    ASSESSMENT:   57 yo female admitted with DKA, severe sepsis, respiratory insufficiency due to acute encephalopathy, vomiting dark emesis, required intubation . Pt with hx of HTN, DM, multiple CVA with residual deficits. Pt admitted from [facility] rehab. Pt with admission earlier this month s/p fall with ischemic CVA   Pt remains on vent support  Adult Tube Feeding protocol started yesterday. Vital High Protein @ 40 ml/hr and Pro-Stat 30 mL BID.   Pt vomited over night, TF on hold.  OG tube unable to be flushed this AM, concern for possible malpositioning. OG tube removed and new OG placed. Noted abdominal xray confirmed OG tube in stomach. No ileus or obstruction. Abdomen soft, no BS present. No BM  Labs: Creatinine 1.01, BUN wdl, potassium 3.3, CBGs 95-413 Meds: MVI, thiamine, levemir, ss novolog, precedex  Diet Order:   Diet Order            Diet NPO time specified  Diet effective now              EDUCATION NEEDS:   Not appropriate for education at this time  Skin:  Skin Assessment: Skin Integrity Issues: Skin Integrity Issues:: DTI DTI: heel  Last BM:  no documented BM  Height:   Ht Readings from Last 1 Encounters:  07/09/2018 5\' 2"  (1.575 m)    Weight:   Wt Readings from Last 1 Encounters:  07/21/18 84.9 kg    Ideal Body Weight:  50 kg  BMI:  Body  mass index is 34.23 kg/m.  Estimated Nutritional Needs:   Kcal:  [phone] kcals   Protein:  100-126 g  Fluid:  >/= 1.7 L   09/20/18 MS, RD, LDN, CNSC [phone] Pager  [phone] Weekend/On-Call Pager

## 2018-07-21 NOTE — Progress Notes (Addendum)
Madison [name]  VQQ:[id number] DOB: [birth year] DOA: 06/29/2018 PCP: Renford Dills, MD    Reason for Consult / Chief Complaint:  Respiratory Distress/sepsis  Consulting MD:  Dr. Bebe Shaggy 8/30  HPI/Brief Narrative   57 year old female with PMH of aortic valve replacement on Coumadin, Depression, HTN, DM, CVA with residual deficits. Recent admission 8/17 s/p Fall with new subacute infarct of the right occipital lobe with edema and mass effect. Presents to ED on 8/29 with nausea/vomiting and upper abdominal pain. CT A/P with thickened edematous distal esophagus concerning for esophagitis. Remaining exam negative and hemodynamically stable. Discharged back to SNF. On 8/30 at 0300 patient was noted to be obtunded, febrile, and tachycardiac. Sent back to ED. On arrival patient noted to be hyperglycemic with glucose >700, ketones in urine. Given 2 amps of Bicarb. Upon assessment patient minimally responsive and vomiting dark emesis, became hypoxic and was emergently intubated. LA 9. WBC 34.0. Temp 101. PCCM asked to admit   Subjective  Once again did not tolerate SBT. Episodes of apnea with rapid shallow breathing   Assessment & Plan:   Acute hypoxemic respiratory failure requiring intubation mechanical ventilation - not tolerating SBT, with periods of apnea followed by rapid shallow breathing  - ? if related prior neurologic injury. History of CVA   Septic Shock, possible aspiration pneumonia, chest x-ray with bilateral infiltrate Bilateral pulmonary vascular congestion, pulmonary edema, positive cumulative fluid balance Hx of E. Coli UTI Anion Gap Metabolic Acidosis secondary to DKA/Lactic Acidosis, improving  Acute Metabolic Encephalopathy H/O Depression, CVA with residual deficits, recent right occipital lobe infarct  Electrolyte abnormalities: Hypophosphatemia, Hypomagnesemia, Hypokalemia, improved  Stress-induced GI bleeding  Hyperglycemia, type 2 diabetes  Plan:  Attempted to  complete SBT again today.  Patient did not tolerate this again with episodes of apnea followed by rapid shallow breathing.  Possible concern being related to underlying prior neurologic injury, history of CVA.  Will place back in assist control ventilation with goal rest 0 to -1.  Continue CBGs every 4 hours with SSI.  Glucose control has been very labile.  Continue heparin drip.  Once extubated and out of the ICU will likely need to be transitioned back to Coumadin.  This was on for atrial fibrillation.  Will follow H&H.  No additional sign of bleeding. Continue PPI Continue tube feeding, thiamine and multivitamin Complete 2 additional days of ceftriaxone then stop Additional diuresis, 40 Lasix IV x1 Monitor urine output and kidney function.   Best Practice / Goals of Care / Disposition.   DVT prophylaxis: SCDs, Heparin ggt  GI prophylaxis: PO propanozole 40mg  qd  Diet: NPO Mobility: bedrest  Code Status: Full Family Communication: spoke with mother and sister   Disposition / Summary of Today's Plan 07/21/18    Consultants:  Gastroenterology 8/30  Procedures: 8/30 CVC >>  Significant Diagnostic Tests:  8/30 CT head: previous infarcts of b/l PCA, MCA region with no acute abnormalities   Micro Data: Urine Cx 8/17 >> E. Coli  Urine Cx 8/30 >> pending Blood Cxx2 >>pending   Antimicrobials:  Cefepime 8/30 >>9/1 Vanc 8/30 >> 8/31 Flagyl 8/30 >> 8/31 Ceftriaxone >> 9/1 --->    Objective    Examination: General appearance: 57 y.o., female, intubated on mechanical ventilation, appears comfortable  Eyes: anicteric sclerae, moist conjunctivae; tracking appropriately HENT: NCAT; oropharynx, MMM,  Neck: Trachea midline; no jvd, ETT in place Lungs: CTAB, no crackles, no wheeze, with normal respiratory effort and no intercostal retractions CV: irregular,  normal rate, s1 s2  Abdomen: Soft, non-tender; non-distended, BS present  Extremities: No peripheral edema or radial and DP  pulses present bilaterally  Skin: Normal temperature, turgor and texture; no rash Psych: no sign of active delirium  Neuro: left weak, know history of CVA, decreased grip strength, at baseline, unchanged from previous     Blood pressure 120/70, pulse (!) 102, temperature (!) 100.4 F (38 C), temperature source Oral, resp. rate (!) 25, height 5\' 2"  (1.575 m), weight 84.9 kg, SpO2 100 %.   . sodium chloride 10 mL/hr at 07/21/18 1100  . cefTRIAXone (ROCEPHIN)  IV Stopped (07/21/18 1013)  . dexmedetomidine (PRECEDEX) IV infusion Stopped (07/21/18 0636)  . dextrose 5 % and 0.45% NaCl 40 mL/hr at 07/21/18 1100  . fentaNYL infusion INTRAVENOUS 100 mcg/hr (07/21/18 1100)  . heparin 950 Units/hr (07/21/18 1100)      Vent Mode: PRVC FiO2 (%):  [30 %] 30 % Set Rate:  [24 bmp] 24 bmp Vt Set:  [400 mL] 400 mL PEEP:  [5 cmH20] 5 cmH20 Plateau Pressure:  [18 cmH20-20 cmH20] 19 cmH20   Intake/Output Summary (Last 24 hours) at 07/21/2018 1223 Last data filed at 07/21/2018 1135 Gross per 24 hour  Intake 1676.78 ml  Output 3690 ml  Net -2013.22 ml   Filed Weights   07/19/18 0355 07/20/18 0444 07/21/18 0500  Weight: 83.2 kg 85.8 kg 84.9 kg    Labs   CBC: Recent Labs  Lab 06/28/2018 0342 07/17/2018 0611 06/27/2018 1132 07/19/18 0345 07/20/18 0401 07/21/18 0500  WBC 34.0*  --  26.4* 17.2* 12.4* 11.2*  NEUTROABS 28.9*  --  23.7* 13.3* 9.3*  --   HGB 12.0 11.9* 11.0* 9.1* 9.4* 9.1*  HCT 43.0 35.0* 33.9* 28.4* 30.6* 29.1*  MCV 103.1*  --  90.6 91.6 94.7 92.7  PLT 538*  --  415* 332 322 241   Basic Metabolic Panel: Recent Labs  Lab 07/19/18 0345 07/19/18 1330 07/19/18 2258 07/20/18 0401 07/21/18 0500  NA 146* 146* 143 145 142  K 3.3* 2.9* 4.8 4.5 3.3*  CL 111 109 110 112* 106  CO2 27 27 26 27 29   GLUCOSE 174* 182* 161* 182* 336*  BUN 17 12 12 12 20   CREATININE 1.13* 1.06* 1.02* 1.03* 1.01*  CALCIUM 8.1* 8.1* 8.1* 8.2* 8.2*  MG 1.7 2.3 2.1 2.2  --   PHOS <1.0* 3.3 2.0* 2.0*  --     GFR: Estimated Creatinine Clearance: 62.1 mL/min (A) (by C-G formula based on SCr of 1.01 mg/dL (H)). Recent Labs  Lab 07/17/2018 0439 06/29/2018 0554 07/12/2018 0609 07/06/2018 1031 06/26/2018 1132 07/12/2018 2132 07/19/18 0345 07/20/18 0401 07/21/18 0500  PROCALCITON  --  5.61  --   --   --   --  8.84  --   --   WBC  --   --   --   --  26.4*  --  17.2* 12.4* 11.2*  LATICACIDVEN 9.78*  --  6.9* 6.6*  --  1.7  --   --   --    Liver Function Tests: Recent Labs  Lab 07/17/18 1727 06/22/2018 0342  AST 18 27  ALT 15 10  ALKPHOS 108 111  BILITOT 1.2 2.3*  PROT 7.5 6.9  ALBUMIN 3.6 3.4*   Recent Labs  Lab 07/17/18 1727  LIPASE 18   No results for input(s): AMMONIA in the last 168 hours. ABG    Component Value Date/Time   PHART 7.466 (H) 07/19/2018 07/20/18  PCO2ART 34.7 07/19/2018 0434   PO2ART 52.0 (L) 07/19/2018 0434   HCO3 25.0 07/19/2018 0434   TCO2 26 07/19/2018 0434   ACIDBASEDEF 9.1 (H) 07/09/2018 0805   O2SAT 88.0 07/19/2018 0434    Coagulation Profile: Recent Labs  Lab 06/20/2018 0342 07/19/18 1057 07/20/18 0401 07/21/18 0540  INR 2.78 2.39 1.61 1.37   Cardiac Enzymes: Recent Labs  Lab 07/05/2018 1031  TROPONINI <0.03   HbA1C: Hemoglobin A1C  Date/Time Value Ref Range Status  10/01/2017 10:25 AM 9.8  Final  07/01/2017 02:33 PM 9.0  Final   Hgb A1c MFr Bld  Date/Time Value Ref Range Status  07/06/2018 03:29 AM 8.5 (H) 4.8 - 5.6 % Final    Comment:    (NOTE) Pre diabetes:          5.7%-6.4% Diabetes:              >6.4% Glycemic control for   <7.0% adults with diabetes   10/22/2016 03:17 AM 13.0 (H) 4.8 - 5.6 % Final    Comment:    (NOTE)         Pre-diabetes: 5.7 - 6.4         Diabetes: >6.4         Glycemic control for adults with diabetes: <7.0    CBG: Recent Labs  Lab 07/20/18 1959 07/20/18 2359 07/21/18 0412 07/21/18 0725 07/21/18 1121  GLUCAP 413* 95 323* 175* 100*    Chest imaging:  No new chest imaging this morning.   LOS: 3  days   This patient is critically ill with multiple organ system failure; which, requires frequent high complexity decision making, assessment, support, evaluation, and titration of therapies. This was completed through the application of advanced monitoring technologies and extensive interpretation of multiple databases. During this encounter critical care time was devoted to patient care services described in this note for 34 minutes.   Josephine Igo, DO [facility] 07/21/2018 12:23 PM  Personal pager: [phone] If unanswered, please page CCM On-call: #[phone]

## 2018-07-22 ENCOUNTER — Inpatient Hospital Stay (HOSPITAL_COMMUNITY): Payer: Medicare Other

## 2018-07-22 DIAGNOSIS — Z7189 Other specified counseling: Secondary | ICD-10-CM

## 2018-07-22 LAB — BASIC METABOLIC PANEL
ANION GAP: 11 (ref 5–15)
BUN: 28 mg/dL — AB (ref 6–20)
CO2: 25 mmol/L (ref 22–32)
Calcium: 8.5 mg/dL — ABNORMAL LOW (ref 8.9–10.3)
Chloride: 106 mmol/L (ref 98–111)
Creatinine, Ser: 0.9 mg/dL (ref 0.44–1.00)
GFR calc Af Amer: >60 mL/min (ref >60)
GFR calc non Af Amer: >60 mL/min (ref >60)
GLUCOSE: 371 mg/dL — AB (ref 70–99)
Potassium: 3.8 mmol/L (ref 3.5–5.1)
Sodium: 142 mmol/L (ref 135–145)

## 2018-07-22 LAB — CBC
HEMATOCRIT: 29.1 % — AB (ref 36.0–46.0)
HEMATOCRIT: 28.1 % — AB (ref 36.0–46.0)
HEMOGLOBIN: 9.1 g/dL — AB (ref 12.0–15.0)
Hemoglobin: 8.7 g/dL — ABNORMAL LOW (ref 12.0–15.0)
MCH: 29.2 pg (ref 26.0–34.0)
MCH: 29 pg (ref 26.0–34.0)
MCHC: 31.3 g/dL (ref 30.0–36.0)
MCHC: 31 g/dL (ref 30.0–36.0)
MCV: 93.3 fL (ref 78.0–100.0)
MCV: 93.7 fL (ref 78.0–100.0)
Platelets: 242 10*3/uL (ref 150–400)
Platelets: 210 10*3/uL (ref 150–400)
RBC: 3.12 MIL/uL — ABNORMAL LOW (ref 3.87–5.11)
RBC: 3 MIL/uL — ABNORMAL LOW (ref 3.87–5.11)
RDW: 14.2 % (ref 11.5–15.5)
RDW: 14.2 % (ref 11.5–15.5)
WBC: 11.9 10*3/uL — ABNORMAL HIGH (ref 4.0–10.5)
WBC: 10.1 10*3/uL (ref 4.0–10.5)

## 2018-07-22 LAB — PHOSPHORUS: Phosphorus: 2.5 mg/dL (ref 2.5–4.6)

## 2018-07-22 LAB — GLUCOSE, CAPILLARY
GLUCOSE-CAPILLARY: 151 mg/dL — AB (ref 70–99)
Glucose-Capillary: 123 mg/dL — ABNORMAL HIGH (ref 70–99)
Glucose-Capillary: 331 mg/dL — ABNORMAL HIGH (ref 70–99)
Glucose-Capillary: 134 mg/dL — ABNORMAL HIGH (ref 70–99)
Glucose-Capillary: 140 mg/dL — ABNORMAL HIGH (ref 70–99)

## 2018-07-22 LAB — PROTIME-INR
INR: 1.25
Prothrombin Time: 15.6 seconds — ABNORMAL HIGH (ref 11.4–15.2)

## 2018-07-22 LAB — HEPARIN LEVEL (UNFRACTIONATED): HEPARIN UNFRACTIONATED: 0.54 [IU]/mL (ref 0.30–0.70)

## 2018-07-22 LAB — MAGNESIUM: Magnesium: 1.7 mg/dL (ref 1.7–2.4)

## 2018-07-22 MED ORDER — ETOMIDATE 2 MG/ML IV SOLN
20.0000 mg | Freq: Once | INTRAVENOUS | Status: AC
  Administered 2018-07-22: 20 mg via INTRAVENOUS

## 2018-07-22 MED ORDER — FUROSEMIDE 10 MG/ML IJ SOLN
40.0000 mg | Freq: Four times a day (QID) | INTRAMUSCULAR | Status: AC
  Administered 2018-07-22 (×3): 40 mg via INTRAVENOUS
  Filled 2018-07-22 (×3): qty 4

## 2018-07-22 MED ORDER — INSULIN ASPART 100 UNIT/ML ~~LOC~~ SOLN
3.0000 [IU] | SUBCUTANEOUS | Status: DC
  Administered 2018-07-22 – 2018-07-23 (×6): 3 [IU] via SUBCUTANEOUS

## 2018-07-22 MED ORDER — MIDAZOLAM HCL 2 MG/2ML IJ SOLN
2.0000 mg | Freq: Once | INTRAMUSCULAR | Status: AC
  Administered 2018-07-22: 2 mg via INTRAVENOUS

## 2018-07-22 MED ORDER — ROCURONIUM BROMIDE 50 MG/5ML IV SOLN
50.0000 mg | Freq: Once | INTRAVENOUS | Status: AC
  Administered 2018-07-22: 50 mg via INTRAVENOUS

## 2018-07-22 MED ORDER — POTASSIUM CHLORIDE 20 MEQ/15ML (10%) PO SOLN
40.0000 meq | Freq: Three times a day (TID) | ORAL | Status: AC
  Administered 2018-07-22 (×2): 40 meq
  Filled 2018-07-22 (×2): qty 30

## 2018-07-22 MED ORDER — MIDAZOLAM HCL 2 MG/2ML IJ SOLN
INTRAMUSCULAR | Status: AC
  Administered 2018-07-22: 2 mg via INTRAVENOUS
  Filled 2018-07-22: qty 2

## 2018-07-22 MED ORDER — FENTANYL CITRATE (PF) 100 MCG/2ML IJ SOLN
INTRAMUSCULAR | Status: AC
  Administered 2018-07-22: 100 ug via INTRAVENOUS
  Filled 2018-07-22: qty 2

## 2018-07-22 MED ORDER — FENTANYL CITRATE (PF) 100 MCG/2ML IJ SOLN
100.0000 ug | Freq: Once | INTRAMUSCULAR | Status: AC
  Administered 2018-07-22: 100 ug via INTRAVENOUS

## 2018-07-22 NOTE — Progress Notes (Signed)
RN entered pt's room to discover that pt had self-extubated. Removed tube and placed patient on NRBM while waiting for MD to arrive. Paused continuous sedation. VSS, pt did not drop O2 saturation. Pt  Vomiting significant amount of bile prior to reintubation. Pt was reintubated, OGT placed to low int. Suction. VSS. Nursing will continue to monitor.

## 2018-07-22 NOTE — Procedures (Signed)
Intubation Procedure Note Madison [name] [id number] December 10, 1960  Procedure: Intubation Indications: Airway protection and maintenance  Procedure Details Consent: Unable to obtain consent because of emergent medical necessity. Time Out: Verified patient identification, verified procedure, site/side was marked, verified correct patient position, special equipment/implants available, medications/allergies/relevent history reviewed, required imaging and test results available.  Performed  Maximum sterile technique was used including gloves, hand hygiene and mask.  MAC    Evaluation Hemodynamic Status: BP stable throughout; O2 sats: stable throughout Patient's Current Condition: stable Complications: No apparent complications Patient did tolerate procedure well. Chest X-ray ordered to verify placement.  CXR: pending.   Madison [name] 07/22/2018

## 2018-07-22 NOTE — Progress Notes (Addendum)
Madison [name]  FOY:[id number] DOB: 02/04/1961 DOA: 07/03/2018 PCP: Renford Dills, MD    Reason for Consult / Chief Complaint:  Respiratory Distress/sepsis  Consulting MD:  Dr. Bebe Shaggy 8/30  HPI/Brief Narrative   57 year old female with PMH of aortic valve replacement on Coumadin, Depression, HTN, DM, CVA with residual deficits. Recent admission 8/17 s/p Fall with new subacute infarct of the right occipital lobe with edema and mass effect. Presents to ED on 8/29 with nausea/vomiting and upper abdominal pain. CT A/P with thickened edematous distal esophagus concerning for esophagitis. Remaining exam negative and hemodynamically stable. Discharged back to SNF. On 8/30 at 0300 patient was noted to be obtunded, febrile, and tachycardiac. Sent back to ED. On arrival patient noted to be hyperglycemic with glucose >700, ketones in urine. Given 2 amps of Bicarb. Upon assessment patient minimally responsive and vomiting dark emesis, became hypoxic and was emergently intubated. LA 9. WBC 34.0. Temp 101. PCCM asked to admit   Subjective  01/2018 failed spontaneous breathing trial secondary to respiratory rate of 40  Assessment & Plan:   Acute hypoxemic respiratory failure requiring intubation mechanical ventilation Not tolerating SBT, with periods of apnea followed by rapid shallow breathing  ? if related prior neurologic injury. History of CVA   Septic Shock, possible aspiration pneumonia, chest x-ray with bilateral infiltrate Bilateral pulmonary vascular congestion, pulmonary edema, positive cumulative fluid balance Hx of E. Coli UTI Anion Gap Metabolic Acidosis secondary to DKA/Lactic Acidosis, improving  Acute Metabolic Encephalopathy H/O Depression, CVA with residual deficits, recent right occipital lobe infarct  Electrolyte abnormalities: Hypophosphatemia, Hypomagnesemia, Hypokalemia, improved  Stress-induced GI bleeding  Hyperglycemia, type 2 diabetes  Plan:  Hold weaning trials  this AM Consideration must be given to tracheostomy in the near future if he continues to fail Her mental status and agitation remains a block  Sliding scale insulin protocol  Continue heparin drip.  Once extubated can return to Coumadin.  History of aortic valve replacement monitor for GI bleeding Feeding a multivitamin Continue ceftriaxone and check sputum culture on 07/22/2018 Monitor urine output   Best Practice / Goals of Care / Disposition.   DVT prophylaxis: SCDs, Heparin ggt  GI prophylaxis: PO propanozole 40mg  qd  Diet: NPO Mobility: bedrest  Code Status: Full Family Communication: 07/22/2018 no family at bedside at 1000 hrs.  Disposition / Summary of Today's Plan 07/22/18    Consultants:  Gastroenterology 8/30  Procedures: 8/30 CVC >>  Significant Diagnostic Tests:  8/30 CT head: previous infarcts of b/l PCA, MCA region with no acute abnormalities   Micro Data: Urine Cx 8/17 >> E. 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 rate (!) 25, height 5\' 2"  (1.575 m), weight 84.2 kg, SpO2 100 %.   . sodium chloride Stopped (07/21/18 2141)  .  cefTRIAXone (ROCEPHIN)  IV 1 g (07/22/18 0917)  . dexmedetomidine (PRECEDEX) IV infusion 0.4 mcg/kg/hr (07/22/18 0900)  . feeding  supplement (VITAL HIGH PROTEIN) 1,000 mL (07/21/18 1428)  . fentaNYL infusion INTRAVENOUS Stopped (07/22/18 0757)  . heparin 950 Units/hr (07/22/18 0900)      Vent Mode: PRVC FiO2 (%):  [30 %] 30 % Set Rate:  [24 bmp] 24 bmp Vt Set:  [400 mL] 400 mL PEEP:  [5 cmH20] 5 cmH20 Pressure Support:  [14 cmH20] 14 cmH20 Plateau Pressure:  [17 cmH20-20 cmH20] 18 cmH20   Intake/Output Summary (Last 24 hours) at 07/22/2018 1000 Last data filed at 07/22/2018 0900 Gross per 24 hour  Intake 1546.1 ml  Output 1210 ml  Net 336.1 ml   Filed Weights   07/20/18 0444 07/21/18 0500 07/22/18 0411  Weight: 85.8 kg 84.9 kg 84.2 kg    Labs   CBC: Recent Labs  Lab 06/25/2018 0342  07/04/2018 1132 07/19/18 0345 07/20/18 0401 07/21/18 0500 07/22/18 0457  WBC 34.0*  --  26.4* 17.2* 12.4* 11.2* 11.9*  NEUTROABS 28.9*  --  23.7* 13.3* 9.3*  --   --   HGB 12.0   < > 11.0* 9.1* 9.4* 9.1* 9.1*  HCT 43.0   < > 33.9* 28.4* 30.6* 29.1* 29.1*  MCV 103.1*  --  90.6 91.6 94.7 92.7 93.3  PLT 538*  --  415* 332 322 241 242   < > = values in this interval not displayed.   Basic Metabolic Panel: Recent Labs  Lab 07/19/18 0345 07/19/18 1330 07/19/18 2258 07/20/18 0401 07/21/18 0500  NA 146* 146* 143 145 142  K 3.3* 2.9* 4.8 4.5 3.3*  CL 111 109 110 112* 106  CO2 27 27 26 27 29   GLUCOSE 174* 182* 161* 182* 336*  BUN 17 12 12 12 20   CREATININE 1.13* 1.06* 1.02* 1.03* 1.01*  CALCIUM 8.1* 8.1* 8.1* 8.2* 8.2*  MG 1.7 2.3 2.1 2.2  --   PHOS <1.0* 3.3 2.0* 2.0*  --    GFR: Estimated Creatinine Clearance: 61.8 mL/min (A) (by C-G formula based on SCr of 1.01 mg/dL (H)). Recent Labs  Lab 07/17/2018 0439 06/30/2018 0554 07/16/2018 0609 07/11/2018 1031  07/08/2018 2132 07/19/18 0345 07/20/18 0401 07/21/18 0500 07/22/18 0457  PROCALCITON  --  5.61  --   --   --   --  8.84  --   --   --   WBC  --   --   --   --    < >  --  17.2* 12.4* 11.2* 11.9*  LATICACIDVEN 9.78*  --  6.9* 6.6*  --  1.7  --   --   --   --    < >  = values in this interval not displayed.   Liver Function Tests: Recent Labs  Lab 07/17/18 1727 07/13/2018 0342  AST 18 27  ALT 15 10  ALKPHOS 108 111  BILITOT 1.2 2.3*  PROT 7.5 6.9  ALBUMIN 3.6 3.4*   Recent Labs  Lab 07/17/18 1727  LIPASE 18   No results for input(s): AMMONIA in the last 168 hours. ABG    Component Value Date/Time   PHART 7.466 (H) 07/19/2018 0434   PCO2ART 34.7 07/19/2018 0434   PO2ART 52.0 (L) 07/19/2018 0434   HCO3 25.0 07/19/2018 0434   TCO2 26 07/19/2018 0434   ACIDBASEDEF 9.1 (H) 06/23/2018 0805   O2SAT 88.0 07/19/2018 0434    Coagulation Profile: Recent Labs  Lab 07/17/2018  7017 07/19/18 1057 07/20/18 0401 07/21/18 0540 07/22/18 0457  INR 2.78 2.39 1.61 1.37 1.25   Cardiac Enzymes: Recent Labs  Lab 07/15/2018 1031  TROPONINI <0.03   HbA1C: Hemoglobin A1C  Date/Time Value Ref Range Status  10/01/2017 10:25 AM 9.8  Final  07/01/2017 02:33 PM 9.0  Final   Hgb A1c MFr Bld  Date/Time Value Ref Range Status  07/06/2018 03:29 AM 8.5 (H) 4.8 - 5.6 % Final    Comment:    (NOTE) Pre diabetes:          5.7%-6.4% Diabetes:              >6.4% Glycemic control for   <7.0% adults with diabetes   10/22/2016 03:17 AM 13.0 (H) 4.8 - 5.6 % Final    Comment:    (NOTE)         Pre-diabetes: 5.7 - 6.4         Diabetes: >6.4         Glycemic control for adults with diabetes: <7.0    CBG: Recent Labs  Lab 07/21/18 1515 07/21/18 1945 07/21/18 2324 07/22/18 0349 07/22/18 0754  GLUCAP 362* 322* 209* 123* 151*    Chest imaging:  07/22/2018 no chest x-ray available   LOS: 4 days   cct 30 min     Brett Canales Minor ACNP [facility] PCCM Pager [phone] till 1 pm If no answer page [phone] 07/22/2018, 10:01 AM  Attending Note:  57 year old female s/p extensive CVAs who presents with an upper GI bleed.  On exam, patient is exhibiting cheyne stoke breathing and is not extubatable.  I reviewed CXR myself, ETT is in good position.  Discussed  with PCCM-NP and bedside RN.  No family present to make decisions.  Spoke with mother who is coming in today and will need to discuss plan of care.  Will increase lasix.  Recheck all labs.  Replace K.  Hold off weaning for now.  The patient is critically ill with multiple organ systems failure and requires high complexity decision making for assessment and support, frequent evaluation and titration of therapies, application of advanced monitoring technologies and extensive interpretation of multiple databases.   Critical Care Time devoted to patient care services described in this note is  32  Minutes. This time reflects time of care of this signee Dr Koren Bound. This critical care time does not reflect procedure time, or teaching time or supervisory time of PA/NP/Med student/Med Resident etc but could involve care discussion time.  Alyson Reedy, M.D. [facility] Pulmonary/Critical Care Medicine. Pager: [phone]. After hours pager: [phone].

## 2018-07-22 NOTE — Progress Notes (Signed)
Inpatient Diabetes Program Recommendations  AACE/ADA: New Consensus Statement on Inpatient Glycemic Control (2015)  Target Ranges:  Prepandial:   less than 140 mg/dL      Peak postprandial:   less than 180 mg/dL (1-2 hours)      Critically ill patients:  140 - 180 mg/dL   Lab Results  Component Value Date   GLUCAP 151 (H) 07/22/2018   HGBA1C 8.5 (H) 07/06/2018    Review of Glycemic Control Results for Madison [name], Madison [name] (MRN [id number]) as of 07/22/2018 09:57  Ref. Range 07/21/2018 19:45 07/21/2018 23:24 07/22/2018 03:49 07/22/2018 07:54  Glucose-Capillary Latest Ref Range: 70 - 99 mg/dL 419 (H) 379 (H) 024 (H) 151 (H)   Diabetes history: Type 1 DM Outpatient Diabetes medications: Novolog 13 units TID, Levemir 20 units QHS (reported not taking by Nursing Home administration guide on 07/17/18) Current orders for Inpatient glycemic control: Novolog 0-20 units Q4H, Levemir 10 units QD Vital @40ml /hr, Prostat @ 30 ml BID  Inpatient Diabetes Program Recommendations:    Would recommend increasing Levemir to 16 units QD. If tube feeds to continue, consider adding tube feed coverage: Novolog 3 units Q4H (hold tube feed coverage if tube feeds are held).  Also of note, had been seen by Dr on 10/01/17, who released patient from practice due to miscommunication with insulin dosages.   Thanks, 10/03/17, MSN, RNC-[facility] Diabetes Coordinator [phone] (8a-5p)

## 2018-07-22 NOTE — Progress Notes (Addendum)
4:36pm-CSW spoke with patient's sister, [name]. She states that she has a meeting with the MD Wednesday between 9-12. She will be coming with her mother, sister, and patient's special needs son to make the decision to take the patient off of the ventilator. They will likely arrive around 9am.   4:29pm-CSW received consult that MD would like to schedule meeting with patient's family. Per [name] patient's sister is the contact. [name] states patient's son is not involved. CSW will attempt to contact patient's sister.   Osborne Casco Monita Swier LCSW [phone]

## 2018-07-22 NOTE — Progress Notes (Signed)
ANTICOAGULATION CONSULT NOTE  Pharmacy Consult for Heparin Indication: mechanical valve  Allergies  Allergen Reactions  . Tape Other (See Comments)    Can burn skin if left on too long. Reaction:Burning  Can burn skin if left on too long.  . Celebrex [Celecoxib] Rash  . Detrol [Tolterodine] Hives   Patient Measurements: Height: 5\' 2"  (157.5 cm) Weight: 185 lb 10 oz (84.2 kg) IBW/kg (Calculated) : 50.1 Heparin Dosing Weight: 69kg  Vital Signs: Temp: 99.1 F (37.3 C) (09/03 0500) Temp Source: Core (09/03 0000) BP: 117/61 (09/03 0500) Pulse Rate: 65 (09/03 0500)  Labs: Recent Labs    07/19/18 2258  07/20/18 0401 07/20/18 1752 07/21/18 0500 07/21/18 0540 07/22/18 0457  HGB  --    < > 9.4*  --  9.1*  --  9.1*  HCT  --   --  30.6*  --  29.1*  --  29.1*  PLT  --   --  322  --  241  --  242  LABPROT  --   --  19.0*  --   --  16.7* 15.6*  INR  --   --  1.61  --   --  1.37 1.25  HEPARINUNFRC  --   --   --  0.68  --  0.40 0.54  CREATININE 1.02*  --  1.03*  --  1.01*  --   --    < > = values in this interval not displayed.   Estimated Creatinine Clearance: 61.8 mL/min (A) (by C-G formula based on SCr of 1.01 mg/dL (H)).  Assessment: Madison [name] on chronic warfarin for history of mechanical valve. Warfarin initially held and reversed with a small dose of vitamin K due to possible GIB. INR today is now subtherapeutic and GIB mostly resolved so IV heparin was started. Heparin level remains within goal range at 0.54 this AM. Due to recent bleeding, targeting the lower end of goal range. Hgb stable 9.1, pltc WNL. No new bleeding noted.    Goal of Therapy:  Heparin level 0.3-0.7 units/ml Monitor platelets by anticoagulation protocol: Yes   Plan:  Continue heparin gtt at 950 units Daily heparin level and CBC F/u restart warfarin  58, PharmD PGY1 Pharmacy Resident Phone [phone] 07/22/2018       6:19 AM

## 2018-07-22 NOTE — Plan of Care (Deleted)
  Problem: Nutrition: Goal: Adequate nutrition will be maintained Outcome: Progressing Note:  Tube feeds and supplements   Problem: Elimination: Goal: Will not experience complications related to bowel motility Outcome: Progressing Goal: Will not experience complications related to urinary retention Outcome: Progressing Note:  purewick   Problem: Coping: Goal: Level of anxiety will decrease Outcome: Not Progressing Note:  Periodic anxiety/agitation/restlessness. Improved with medication and placing patient on ventilator.

## 2018-07-22 NOTE — Progress Notes (Signed)
RT note- Attempted to wean, 14/5, tolerated for 30 min. Increased RR 35-40. Placed back to full support.

## 2018-07-22 NOTE — Plan of Care (Signed)
  Problem: Elimination: Goal: Will not experience complications related to urinary retention 07/22/2018 2203 by Della Goo, RN Outcome: Progressing Note:  Foley   Problem: Pain Managment: Goal: General experience of comfort will improve Outcome: Progressing Note:  sedated   Problem: Nutrition: Goal: Adequate nutrition will be maintained 07/22/2018 2203 by Della Goo, RN Outcome: Progressing Note:  Tube feeds

## 2018-07-23 ENCOUNTER — Inpatient Hospital Stay (HOSPITAL_COMMUNITY): Payer: Medicare Other

## 2018-07-23 DIAGNOSIS — J9601 Acute respiratory failure with hypoxia: Secondary | ICD-10-CM

## 2018-07-23 DIAGNOSIS — Z515 Encounter for palliative care: Secondary | ICD-10-CM

## 2018-07-23 LAB — BASIC METABOLIC PANEL
Anion gap: 9 (ref 5–15)
BUN: 26 mg/dL — AB (ref 6–20)
CALCIUM: 8.7 mg/dL — AB (ref 8.9–10.3)
CHLORIDE: 109 mmol/L (ref 98–111)
CO2: 28 mmol/L (ref 22–32)
CREATININE: 1.07 mg/dL — AB (ref 0.44–1.00)
GFR calc non Af Amer: 57 mL/min — ABNORMAL LOW (ref >60)
Glucose, Bld: 189 mg/dL — ABNORMAL HIGH (ref 70–99)
Potassium: 3.7 mmol/L (ref 3.5–5.1)
Sodium: 146 mmol/L — ABNORMAL HIGH (ref 135–145)

## 2018-07-23 LAB — BLOOD GAS, ARTERIAL
ACID-BASE EXCESS: 3.8 mmol/L — AB (ref 0.0–2.0)
Bicarbonate: 26.9 mmol/L (ref 20.0–28.0)
Drawn by: 252031
FIO2: 40
O2 SAT: 97.9 %
PATIENT TEMPERATURE: 98.6
PCO2 ART: 33.8 mmHg (ref 32.0–48.0)
PEEP/CPAP: 5 cmH2O
PH ART: 7.512 — AB (ref 7.350–7.450)
RATE: 24 resp/min
VT: 400 mL
pO2, Arterial: 96.8 mmHg (ref 83.0–108.0)

## 2018-07-23 LAB — CBC WITH DIFFERENTIAL/PLATELET
Abs Immature Granulocytes: 0.1 10*3/uL (ref 0.0–0.1)
Basophils Absolute: 0 10*3/uL (ref 0.0–0.1)
Basophils Relative: 0 %
EOS ABS: 0.2 10*3/uL (ref 0.0–0.7)
Eosinophils Relative: 2 %
HEMATOCRIT: 30 % — AB (ref 36.0–46.0)
Hemoglobin: 9.3 g/dL — ABNORMAL LOW (ref 12.0–15.0)
IMMATURE GRANULOCYTES: 1 %
LYMPHS ABS: 2.9 10*3/uL (ref 0.7–4.0)
Lymphocytes Relative: 23 %
MCH: 29.2 pg (ref 26.0–34.0)
MCHC: 31 g/dL (ref 30.0–36.0)
MCV: 94.3 fL (ref 78.0–100.0)
Monocytes Absolute: 0.8 10*3/uL (ref 0.1–1.0)
Monocytes Relative: 7 %
NEUTROS PCT: 67 %
Neutro Abs: 8.5 10*3/uL — ABNORMAL HIGH (ref 1.7–7.7)
PLATELETS: 239 10*3/uL (ref 150–400)
RBC: 3.18 MIL/uL — AB (ref 3.87–5.11)
RDW: 14.4 % (ref 11.5–15.5)
WBC: 12.5 10*3/uL — AB (ref 4.0–10.5)

## 2018-07-23 LAB — CULTURE, BLOOD (ROUTINE X 2)
Culture: NO GROWTH
Culture: NO GROWTH

## 2018-07-23 LAB — HEPARIN LEVEL (UNFRACTIONATED): Heparin Unfractionated: 0.44 IU/mL (ref 0.30–0.70)

## 2018-07-23 LAB — MAGNESIUM: Magnesium: 1.7 mg/dL (ref 1.7–2.4)

## 2018-07-23 LAB — GLUCOSE, CAPILLARY
GLUCOSE-CAPILLARY: 218 mg/dL — AB (ref 70–99)
GLUCOSE-CAPILLARY: 111 mg/dL — AB (ref 70–99)
Glucose-Capillary: 178 mg/dL — ABNORMAL HIGH (ref 70–99)

## 2018-07-23 LAB — PHOSPHORUS: Phosphorus: 2.6 mg/dL (ref 2.5–4.6)

## 2018-07-23 LAB — PROTIME-INR
INR: 1.23
Prothrombin Time: 15.4 seconds — ABNORMAL HIGH (ref 11.4–15.2)

## 2018-07-23 MED ORDER — POTASSIUM CHLORIDE 20 MEQ/15ML (10%) PO SOLN
20.0000 meq | ORAL | Status: AC
  Administered 2018-07-23 (×2): 20 meq
  Filled 2018-07-23 (×2): qty 15

## 2018-07-23 MED ORDER — MORPHINE BOLUS VIA INFUSION
5.0000 mg | INTRAVENOUS | Status: DC | PRN
  Filled 2018-07-23: qty 20

## 2018-07-23 MED ORDER — SODIUM CHLORIDE 0.9 % IV SOLN
10.0000 mg/h | INTRAVENOUS | Status: DC
  Administered 2018-07-23: 10 mg/h via INTRAVENOUS
  Filled 2018-07-23 (×2): qty 10

## 2018-07-23 MED ORDER — MORPHINE 100MG IN NS 100ML (1MG/ML) PREMIX INFUSION
10.0000 mg/h | INTRAVENOUS | Status: DC
  Administered 2018-07-23: 10 mg/h via INTRAVENOUS
  Filled 2018-07-23: qty 100

## 2018-07-23 MED ORDER — MIDAZOLAM BOLUS VIA INFUSION (WITHDRAWAL LIFE SUSTAINING TX)
5.0000 mg | INTRAVENOUS | Status: DC | PRN
  Filled 2018-07-23: qty 20

## 2018-07-23 MED ORDER — MAGNESIUM SULFATE 2 GM/50ML IV SOLN
2.0000 g | Freq: Once | INTRAVENOUS | Status: AC
  Administered 2018-07-23: 2 g via INTRAVENOUS
  Filled 2018-07-23: qty 50

## 2018-07-24 ENCOUNTER — Ambulatory Visit: Payer: Self-pay

## 2018-07-24 ENCOUNTER — Encounter (HOSPITAL_COMMUNITY): Payer: Self-pay | Admitting: *Deleted

## 2018-07-24 LAB — CULTURE, RESPIRATORY W GRAM STAIN

## 2018-07-24 LAB — CULTURE, RESPIRATORY: SPECIAL REQUESTS: NORMAL

## 2018-07-25 ENCOUNTER — Telehealth: Payer: Self-pay

## 2018-07-25 NOTE — Telephone Encounter (Signed)
On 07/25/18 I received a d/c from [facility] (original).  The d/c is for cremation.  The patient is a patient of Doctor Molli Knock.  The d/c will be taken to [facility] for signature.  On 07/28/18 I received the d/c back from Doctor Molli Knock.  I got the d/c ready and called the funeral home to let them know the d/c was ready for pickup.

## 2018-08-11 ENCOUNTER — Other Ambulatory Visit (HOSPITAL_COMMUNITY): Payer: Self-pay

## 2018-08-11 ENCOUNTER — Ambulatory Visit: Payer: Self-pay | Admitting: Cardiovascular Disease

## 2018-08-12 ENCOUNTER — Ambulatory Visit: Payer: Self-pay | Admitting: Adult Health

## 2018-08-19 NOTE — Progress Notes (Signed)
RT note-Assessing comfort, placed on PSV as charted and alarms adjusted.

## 2018-08-19 NOTE — Progress Notes (Addendum)
Madison [name]  NMM:[id number] DOB: Feb 25, 1961 DOA: 08-11-2018 PCP: Renford Dills, MD    Reason for Consult / Chief Complaint:  Respiratory Distress/sepsis  Consulting MD:  Dr. Bebe Shaggy 8/30  HPI/Brief Narrative   57 year old female with PMH of aortic valve replacement on Coumadin, Depression, HTN, DM, CVA with residual deficits. Recent admission 8/17 s/p Fall with new subacute infarct of the right occipital lobe with edema and mass effect. Presents to ED on 8/29 with nausea/vomiting and upper abdominal pain. CT A/P with thickened edematous distal esophagus concerning for esophagitis. Remaining exam negative and hemodynamically stable. Discharged back to SNF. On 8/30 at 0300 patient was noted to be obtunded, febrile, and tachycardiac. Sent back to ED. On arrival patient noted to be hyperglycemic with glucose >700, ketones in urine. Given 2 amps of Bicarb. Upon assessment patient minimally responsive and vomiting dark emesis, became hypoxic and was emergently intubated. LA 9. WBC 34.0. Temp 101. PCCM asked to admit   Subjective  No events overnight, no new complaints, self extubated yesterday and reintubated  Assessment & Plan:   Acute hypoxemic respiratory failure requiring intubation mechanical ventilation Not tolerating SBT, with periods of apnea followed by rapid shallow breathing  ? if related prior neurologic injury. History of CVA  P: Terminal extubation today after family discussion  Septic Shock, possible aspiration pneumonia, chest x-ray with bilateral infiltrate Bilateral pulmonary vascular congestion, pulmonary edema, positive cumulative fluid balance Hx of E. Coli UTI Anion Gap Metabolic Acidosis secondary to DKA/Lactic Acidosis, improving D/C further blood draws  Acute Metabolic Encephalopathy H/O Depression, CVA with residual deficits, recent right occipital lobe infarct  Electrolyte abnormalities: Hypophosphatemia, Hypomagnesemia, Hypokalemia, improved    Stress-induced GI bleeding  Hyperglycemia, type 2 diabetes  Plan:  Terminal extubation today per family discussion after starting morphine.  Mother and sisters were there that relay that patient's son was fine with whatever decision they make and was not interested in being there.  DNR status conveyed as well.  DNR orders and withdrawal orders in place   Best Practice / Goals of Care / Disposition.   DVT prophylaxis: SCDs, Heparin ggt  GI prophylaxis: PO propanozole 40mg  qd  Diet: NPO Mobility: bedrest  Code Status: Full Family Communication: 07/22/2018 no family at bedside at 1000 hrs.  Disposition / Summary of Today's Plan 07/20/2018    Consultants:  Gastroenterology 8/30  Procedures: 8/30 CVC >>  Significant Diagnostic Tests:  8/30 CT head: previous infarcts of b/l PCA, MCA region with no acute abnormalities   Micro Data: Urine Cx 8/17 >> E. 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 rate (!) 25, height 5\' 2"  (1.575 m), weight 84.3 kg, SpO2 97 %.   . sodium chloride Stopped (07/21/18 2141)  . dexmedetomidine (PRECEDEX) IV infusion 0.5 mcg/kg/hr (  07/28/18 1000)  . feeding supplement (VITAL HIGH PROTEIN) 1,000 mL (07/22/18 1415)  . fentaNYL infusion INTRAVENOUS  150 mcg/hr (07/28/18 1000)  . heparin 950 Units/hr (2018/07/28 1000)      Vent Mode: PRVC FiO2 (%):  [30 %-40 %] 40 % Set Rate:  [24 bmp] 24 bmp Vt Set:  [400 mL-500 mL] 500 mL PEEP:  [5 cmH20] 5 cmH20 Plateau Pressure:  [17 cmH20-20 cmH20] 20 cmH20   Intake/Output Summary (Last 24 hours) at 07-28-18 1016 Last data filed at 07-28-2018 1000 Gross per 24 hour  Intake 2119.05 ml  Output 3027 ml  Net -907.95 ml   Filed Weights   07/21/18 0500 07/22/18 0411 2018-07-28 0446  Weight: 84.9 kg 84.2 kg 84.3 kg    Labs   CBC: Recent Labs  Lab 06/19/2018 0342  07/19/2018 1132 07/19/18 0345 07/20/18 0401 07/21/18 0500 07/22/18 0457 07/22/18 1318 07/28/2018 0332  WBC 34.0*  --  26.4* 17.2* 12.4* 11.2* 11.9* 10.1 12.5*  NEUTROABS 28.9*  --  23.7* 13.3* 9.3*  --   --   --  8.5*  HGB 12.0   < > 11.0* 9.1* 9.4* 9.1* 9.1* 8.7* 9.3*  HCT 43.0   < > 33.9* 28.4* 30.6* 29.1* 29.1* 28.1* 30.0*  MCV 103.1*  --  90.6 91.6 94.7 92.7 93.3 93.7 94.3  PLT 538*  --  415* 332 322 241 242 210 239   < > = values in this interval not displayed.   Basic Metabolic Panel: Recent Labs  Lab 07/19/18 1330 07/19/18 2258 07/20/18 0401 07/21/18 0500 07/22/18 1318 07/28/2018 0332  NA 146* 143 145 142 142 146*  K 2.9* 4.8 4.5 3.3* 3.8 3.7  CL 109 110 112* 106 106 109  CO2 27 26 27 29 25 28   GLUCOSE 182* 161* 182* 336* 371* 189*  BUN 12 12 12 20  28* 26*  CREATININE 1.06* 1.02* 1.03* 1.01* 0.90 1.07*  CALCIUM 8.1* 8.1* 8.2* 8.2* 8.5* 8.7*  MG 2.3 2.1 2.2  --  1.7 1.7  PHOS 3.3 2.0* 2.0*  --  2.5 2.6   GFR: Estimated Creatinine Clearance: 58.4 mL/min (A) (by C-G formula based on SCr of 1.07 mg/dL (H)). Recent Labs  Lab 06/29/2018 0439 07/08/2018 0554 06/26/2018 0609 07/14/2018 1031  07/02/2018 2132 07/19/18 0345  07/21/18 0500 07/22/18 0457 07/22/18 1318 07-28-2018 0332  PROCALCITON  --  5.61  --   --   --   --  8.84  --   --   --   --   --   WBC  --   --   --   --    < >  --  17.2*   < > 11.2* 11.9* 10.1 12.5*   LATICACIDVEN 9.78*  --  6.9* 6.6*  --  1.7  --   --   --   --   --   --    < > = values in this interval not displayed.   Liver Function Tests: Recent Labs  Lab 07/17/18 1727 07/11/2018 0342  AST 18 27  ALT 15 10  ALKPHOS 108 111  BILITOT 1.2 2.3*  PROT 7.5 6.9  ALBUMIN 3.6 3.4*   Recent Labs  Lab 07/17/18 1727  LIPASE 18   No results for input(s): AMMONIA in the last 168 hours. ABG    Component Value Date/Time   PHART 7.512 (H) 07-28-18 0434   PCO2ART 33.8 2018-07-28 0434   PO2ART 96.8 Jul 28, 2018 0434   HCO3 26.9 07/28/2018 0434  TCO2 26 07/19/2018 0434   ACIDBASEDEF 9.1 (H) 06/30/2018 0805   O2SAT 97.9 07/31/18 0434    Coagulation Profile: Recent Labs  Lab 07/19/18 1057 07/20/18 0401 07/21/18 0540 07/22/18 0457 31-Jul-2018 0335  INR 2.39 1.61 1.37 1.25 1.23   Cardiac Enzymes: Recent Labs  Lab 07/04/2018 1031  TROPONINI <0.03   HbA1C: Hemoglobin A1C  Date/Time Value Ref Range Status  10/01/2017 10:25 AM 9.8  Final  07/01/2017 02:33 PM 9.0  Final   Hgb A1c MFr Bld  Date/Time Value Ref Range Status  07/06/2018 03:29 AM 8.5 (H) 4.8 - 5.6 % Final    Comment:    (NOTE) Pre diabetes:          5.7%-6.4% Diabetes:              >6.4% Glycemic control for   <7.0% adults with diabetes   10/22/2016 03:17 AM 13.0 (H) 4.8 - 5.6 % Final    Comment:    (NOTE)         Pre-diabetes: 5.7 - 6.4         Diabetes: >6.4         Glycemic control for adults with diabetes: <7.0    CBG: Recent Labs  Lab 07/22/18 1619 07/22/18 2013 07/31/18 0000 31-Jul-2018 0431 Jul 31, 2018 0747  GLUCAP 134* 140* 178* 218* 111*    Chest imaging:  07/22/2018 no chest x-ray available   LOS: 5 days   cct 30 min     Brett Canales Minor ACNP [facility] PCCM Pager [phone] till 1 pm If no answer page [phone] July 31, 2018, 10:16 AM  Attending Note:  57 year old female s/p extensive CVAs who presents with an upper GI bleed.  On exam, patient is exhibiting cheyne stoke breathing and  is not extubatable.  I reviewed CXR myself, ETT is in good position.  Discussed with PCCM-NP and bedside RN.  No family present to make decisions.  Spoke with mother who is coming in today and will need to discuss plan of care.  Will increase lasix.  Recheck all labs.  Replace K.  Hold off weaning for now.  The patient is critically ill with multiple organ systems failure and requires high complexity decision making for assessment and support, frequent evaluation and titration of therapies, application of advanced monitoring technologies and extensive interpretation of multiple databases.   Critical Care Time devoted to patient care services described in this note is  32  Minutes. This time reflects time of care of this signee Dr Koren Bound. This critical care time does not reflect procedure time, or teaching time or supervisory time of PA/NP/Med student/Med Resident etc but could involve care discussion time.  Alyson Reedy, M.D. [facility] Pulmonary/Critical Care Medicine. Pager: [phone]. After hours pager: [phone].

## 2018-08-19 NOTE — Accreditation Note (Signed)
o Restraints not reported to CMS Pursuant to regulation 482.13 (G) (3) use of soft wrist restraints was logged on 07/25/2018

## 2018-08-19 NOTE — Progress Notes (Signed)
ANTICOAGULATION CONSULT NOTE  Pharmacy Consult for Heparin Indication: mechanical valve  Allergies  Allergen Reactions  . Tape Other (See Comments)    Can burn skin if left on too long. Reaction:Burning  Can burn skin if left on too long.  . Celebrex [Celecoxib] Rash  . Detrol [Tolterodine] Hives   Patient Measurements: Height: 5\' 2"  (157.5 cm) Weight: 185 lb 13.6 oz (84.3 kg) IBW/kg (Calculated) : 50.1 Heparin Dosing Weight: 69kg  Vital Signs: Temp: 100.2 F (37.9 C) (09/04 0635) Temp Source: Bladder (09/04 0400) BP: 101/67 (09/04 0635) Pulse Rate: 71 (09/04 0635)  Labs: Recent Labs    07/21/18 0500 07/21/18 0540 07/22/18 0457 07/22/18 1318 2018/07/28 0332 07/28/2018 0335  HGB 9.1*  --  9.1* 8.7* 9.3*  --   HCT 29.1*  --  29.1* 28.1* 30.0*  --   PLT 241  --  242 210 239  --   LABPROT  --  16.7* 15.6*  --   --  15.4*  INR  --  1.37 1.25  --   --  1.23  HEPARINUNFRC  --  0.40 0.54  --   --  0.44  CREATININE 1.01*  --   --  0.90 1.07*  --    Estimated Creatinine Clearance: 58.4 mL/min (A) (by C-G formula based on SCr of 1.07 mg/dL (H)).  Assessment: 57 yoF on chronic warfarin for history of mechanical valve. Warfarin initially held and reversed with a small dose of vitamin K due to possible GIB. INR today is now subtherapeutic and GIB mostly resolved so IV heparin was started. Heparin level remains within goal range at 0.44 this AM. Due to recent bleeding, targeting the lower end of goal range. Hgb stable 9.3, pltc WNL. No new bleeding noted.    Goal of Therapy:  Heparin level 0.3-0.7 units/ml Monitor platelets by anticoagulation protocol: Yes   Plan:  Continue heparin gtt at 950 units Daily heparin level and CBC F/u restart warfarin  58, PharmD PGY1 Pharmacy Resident Phone [phone] 07/28/2018       6:52 AM

## 2018-08-19 NOTE — Progress Notes (Signed)
[facility] ADULT ICU REPLACEMENT PROTOCOL FOR AM LAB REPLACEMENT ONLY  The patient does apply for the [facility] Adult ICU Electrolyte Replacment Protocol based on the criteria listed below:   1. Is GFR >/= 40 ml/min? Yes.    Patient's GFR today is 57 2. Is urine output >/= 0.5 ml/kg/hr for the last 6 hours? Yes.   Patient's UOP is 2 ml/kg/hr 3. Is BUN < 60 mg/dL? Yes.    Patient's BUN today is 26 4. Abnormal electrolyte(s): K-3.7 Mag-1.7 5. Ordered repletion with: per protocol 6. If a panic level lab has been reported, has the CCM MD in charge been notified? Yes.  .   Physician:  Dr. Edson Snowball, [place] Boos 08/18/2018 5:59 AM

## 2018-08-19 NOTE — Progress Notes (Signed)
RT Note: pt terminally extubated w/o any distress. Pt placed on room air

## 2018-08-19 NOTE — Progress Notes (Signed)
Patient has passed peacefully .Asystole on monitor and no heart sounds ascultated by myself and Velva Harman. Morphine 90 cc wasted and versed 40 cc wasted and witnessed by Velva Harman. I have also talked with patient's family and informed them . Dr Molli Knock informed at bedside

## 2018-08-19 NOTE — Death Summary Note (Signed)
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 Recent admission 8/17 s/p Fall with new subacute infarct of the right occipital lobe with edema and mass effect. Presents to ED on 8/29 with nausea/vomiting and upper abdominal pain. CT A/P with thickened edematous distal esophagus concerning for esophagitis. Remaining exam negativeand hemodynamically stable. Discharged back to SNF. On 07/30/23 at 0300 patient was noted to be obtunded, febrile, and tachycardiac. Sent back to ED. On arrival patient noted to be hyperglycemic with glucose >700, ketones in urine. Given 2 amps of Bicarb. Upon assessment patient minimally responsive and vomiting dark emesis, became hypoxic and was emergently intubated. LA 9. WBC 34.0. Temp 101. PCCM asked to admit  Terminal extubation today per family discussion after starting morphine.  Mother and sisters were there that relay that patient's son was fine with whatever decision they make and was not interested in being  there.  DNR status conveyed as well.  Pertinent Labs and Studies  Significant Diagnostic Studies Dg Chest 2 View  Result Date: 07/05/2018 CLINICAL DATA:  Pain following fall EXAM: CHEST - 2 VIEW COMPARISON:  November 29, 2016 FINDINGS: There is no edema or consolidation. Heart size and pulmonary vascularity are normal. No adenopathy. Patient is status post aortic valve replacement. No adenopathy. No bone lesions. No pneumothorax. IMPRESSION: Status post aortic valve replacement. No edema or consolidation. Heart size normal. Electronically Signed   By: Bretta Bang III M.D.   On: 07/05/2018 18:21   Ct Head Wo Contrast  Result Date: July 29, 2018 CLINICAL DATA:  56 year old female with encephalopathy and unresponsiveness. EXAM: CT HEAD WITHOUT CONTRAST TECHNIQUE: Contiguous axial images were obtained from the base of the skull through the vertex without intravenous contrast. COMPARISON:  Brain MRI dated 07/05/2018 FINDINGS: Brain: A large areas of old infarct and encephalomalacia involving the bilateral MCA and PCA territory as seen on the prior MRI. Faint apparent cortical high attenuation involving the medial right occipital lobe likely represent cortical laminar necrosis. There is extensive diffuse white matter hypodensity and chronic ischemic changes. There is no acute intracranial hemorrhage. No mass effect or midline shift noted. No extra-axial fluid collection. Vascular: No hyperdense vessel or unexpected calcification. Skull: Normal. Negative for fracture or focal lesion. Sinuses/Orbits: There is mucoperiosteal thickening of the right maxillary sinus. There is opacification of the nasopharynx. Left mastoid effusion. The right mastoid air cells are clear. Other: An endotracheal and enteric tube are partially visualized. IMPRESSION: 1. No acute intracranial hemorrhage. 2. Extensive bilateral MCA and PCA territory old infarcts and encephalomalacia. Advanced chronic microvascular ischemic changes.  Electronically Signed   By: Burtis Junes  Radparvar M.D.   On: 07/01/2018 07:03   Ct Head Wo Contrast  Result Date: 07/05/2018 CLINICAL DATA:  57 y/o  F; fall today.  Headache and dizziness. EXAM: CT HEAD WITHOUT CONTRAST TECHNIQUE: Contiguous axial images were obtained from the base of the skull through the vertex without intravenous contrast. COMPARISON:  04/30/2016 CT head FINDINGS: Brain: There is a region of hypoattenuation with loss of gray-white differentiation and mild local mass effect of the right occipital lobe which is new from the prior CT of head and compatible with subacute infarction. There multiple small cortical infarctions within the bilateral frontal lobes, parietal lobes, except a lobes as well as a large chronic infarction in the right posterior MCA distribution. There are very small chronic infarctions in bilateral cerebellar hemispheres. There are advanced chronic microvascular ischemic changes of white matter and volume loss of the brain. No hemorrhage, extra-axial collection, hydrocephalus, or herniation. Vascular: Calcific atherosclerosis of the carotid siphons and vertebral arteries. No hyperdense vessel identified. Skull: Normal. Negative for fracture or focal lesion. Sinuses/Orbits: Partial left mastoid opacification. Normal aeration of the mastoid air cells with a small right maxillary sinus fluid level. Orbits are unremarkable. Other: None. IMPRESSION: 1. New subacute infarction of the right occipital lobe with mild edema and mass effect. 2. No acute hemorrhage or calvarial fracture. 3. Numerous chronic infarcts throughout the supratentorial and infratentorial brain including a large chronic right posterior MCA infarct. 4. Partial left mastoid opacification. Small right maxillary fluid level may represent acute sinusitis. Electronically Signed   By: Mitzi Hansen M.D.   On: 07/05/2018 18:39   Mr Brain Wo Contrast  Result Date: 07/06/2018 CLINICAL DATA:  Initial evaluation  for acute stroke. EXAM: MRI HEAD WITHOUT CONTRAST TECHNIQUE: Multiplanar, multiecho pulse sequences of the brain and surrounding structures were obtained without intravenous contrast. COMPARISON:  Prior CT from 07/05/2018. FINDINGS: Brain: Diffuse prominence of the CSF containing spaces compatible with generalized age-related cerebral atrophy. Patchy and confluent T2/FLAIR hyperintensity within the periventricular and deep white matter both cerebral hemispheres consistent with chronic small vessel ischemic change, advanced in nature. Extensive encephalomalacia throughout the right cerebral hemisphere consistent with large remote right MCA territory infarct. Additional scattered multifocal left MCA territory infarcts involving the left frontal and parietal lobes. Remote left PCA territory infarct involving the left occipital lobe. Scatter remote bilateral cerebellar infarcts. Small remote lacunar infarcts present within the bilateral thalami. Large confluent area of restricted diffusion involving the parasagittal right parieto-occipital region consistent with acute to early subacute right PCA territory infarct (series 5, image 69). Patchy involvement of the right thalamus. Associated petechial hemorrhage without frank hemorrhagic transformation (series 12, image 23). Associated localized gyral swelling and edema without significant regional mass effect. No other evidence for acute or subacute infarct. No mass lesion, midline shift or mass effect. Mild ex vacuo dilatation of the left lateral ventricle related to chronic left PCA territory infarct without hydrocephalus. No extra-axial fluid collection. Pituitary gland normal. Vascular: Abnormal flow void within the right ICA to the level of the terminus, likely occluded. Major intracranial vascular flow voids otherwise maintained. Skull and upper cervical spine: Craniocervical junction normal. Upper cervical spine within normal limits. Bone marrow signal intensity  normal. No scalp soft tissue abnormality. Sinuses/Orbits: Globes and orbital soft tissues within normal limits. Mild mucosal thickening with small air-fluid level within the right maxillary sinus, suggesting possible acute sinusitis. Left mastoid effusion noted. Trace opacity right mastoid air cells. Other: None. IMPRESSION: 1. Large acute to early subacute right PCA  territory infarct. Associated petechial hemorrhage without frank hemorrhagic transformation. No significant regional mass effect. 2. Extensive chronic ischemic changes with multiple remote bilateral MCA and PCA territory infarcts, with additional remote bilateral cerebellar infarcts. 3. Abnormal flow void within the right ICA to the terminus, likely occluded. 4. Advanced atrophy with chronic small vessel ischemic disease. Electronically Signed   By: Rise Mu M.D.   On: 07/06/2018 00:48   Ct Abdomen Pelvis W Contrast  Result Date: 07/17/2018 CLINICAL DATA:  Nausea, vomiting and abdominal pain. History of stroke. EXAM: CT ABDOMEN AND PELVIS WITH CONTRAST TECHNIQUE: Multidetector CT imaging of the abdomen and pelvis was performed using the standard protocol following bolus administration of intravenous contrast. CONTRAST:  80mL ISOVUE-300 IOPAMIDOL (ISOVUE-300) INJECTION 61% COMPARISON:  Abdominal radiograph July 17, 2018 and CT abdomen and pelvis Mar 24, 2016 FINDINGS: LOWER CHEST: Dependent atelectasis. Included heart size is normal. Status post median sternotomy. Similarly thickened edematous distal esophagus. HEPATOBILIARY: Liver and gallbladder are normal. PANCREAS: Normal. SPLEEN: Normal. ADRENALS/URINARY TRACT: Kidneys are orthotopic, demonstrating symmetric enhancement. No nephrolithiasis, hydronephrosis or solid renal masses. Symmetric advanced vascular calcifications. The unopacified ureters are normal in course and caliber. Delayed imaging through the kidneys demonstrates symmetric prompt contrast excretion within the proximal  urinary collecting system. Urinary bladder is partially distended and unremarkable. Normal adrenal glands. STOMACH/BOWEL: The stomach, small and large bowel are normal in caliber without inflammatory changes. Multiple loops of small and large bowel course freely in and out of umbilical hernia. Normal appendix. VASCULAR/LYMPHATIC: Aortoiliac vessels are normal in course and caliber. Moderate calcific atherosclerosis. No lymphadenopathy by CT size criteria. 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 OTHER: No intraperitoneal free fluid or free air. MUSCULOSKELETAL: Nonacute. Subacute appearing T11 superior endplate moderate compression fracture. Moderate old T12 compression fracture. Wide necked moderate umbilical hernia. Asymmetrically atrophic LEFT iliopsoas muscles. LEFT femur ORIF with heterotopic ossification. Symmetric anterior abdominal wall soft tissue densities within subcutaneous fat most compatible with injection granulomas IMPRESSION: 1. Similarly thickened edematous distal esophagus, differential diagnosis includes recurrent esophagitis or mass. Recommend endoscopy. 2. No acute intra-abdominal or pelvic process. 3. Subacute appearing moderate T11 compression fracture. Aortic Atherosclerosis (ICD10-I70.0). Electronically Signed   By: Awilda Metro M.D.   On: 07/17/2018 20:23   Dg Chest Port 1 View  Result Date: 08-12-2018 CLINICAL DATA:  Hypoxia EXAM: PORTABLE CHEST 1 VIEW COMPARISON:  July 22, 2018 FINDINGS: Endotracheal tube tip is 2.5 cm above the carina. Central catheter tip is at the cavoatrial junction. Nasogastric tube tip and side port are in the stomach. No pneumothorax. There is atelectatic change in the lower lung zones bilaterally. Lungs elsewhere clear. Heart size and pulmonary vascularity are normal. No adenopathy. Patient is status post aortic valve replacement. There is evidence of a prior fracture of the  proximal left humeral head. IMPRESSION: Tube and catheter positions as described without pneumothorax. Bibasilar atelectasis. No frank edema or consolidation. Stable cardiac silhouette. Age uncertain fracture proximal left humerus. Electronically Signed   By: Bretta Bang III M.D.   On: 12-Aug-2018 08:03   Dg Chest Port 1 View  Result Date: 07/22/2018 CLINICAL DATA:  ETT and OG tube placement EXAM: PORTABLE CHEST 1 VIEW COMPARISON:  Chest x-ray July 22, 2018 FINDINGS: The ETT terminates 2.3 cm above the carina in good position. The NG tube terminates below today's film. No pneumothorax. The right central line is stable. Opacity in the right mid lower lung is stable. Increased platelike opacity in  left base is likely atelectasis. No other change. IMPRESSION: 1. Support apparatus as above. 2. Persistent opacity in the right mid lower lung could represent atelectasis or infiltrate. New opacity in left base is probably atelectasis. Electronically Signed   By: Gerome Sam III M.D   On: 07/22/2018 13:21   Dg Chest Port 1 View  Result Date: 07/22/2018 CLINICAL DATA:  Intubation. EXAM: PORTABLE CHEST 1 VIEW COMPARISON:  07/19/2018. FINDINGS: Endotracheal tube, NG tube, right IJ line in stable position. Prior cardiac valve replacement. Heart size normal. Persistent right base atelectasis/infiltrate. Without significant change. New onset left base atelectasis. No pleural effusion or pneumothorax. IMPRESSION: 1.  Lines and tubes in stable position. 2. Persistent right base atelectasis/infiltrate without significant interim change. New left base atelectasis. Electronically Signed   By: Maisie Fus  Register   On: 07/22/2018 12:06   Dg Chest Port 1 View  Result Date: 07/19/2018 CLINICAL DATA:  Diabetic ketoacidosis. Acute respiratory failure requiring intubation. EXAM: PORTABLE CHEST 1 VIEW COMPARISON:  Single-view of the chest August 04, 2018. PA and lateral chest 07/05/2018. FINDINGS: Support tubes and lines are  unchanged. There is new patchy right basilar airspace disease. Left lung is clear. No pneumothorax or pleural effusion. Heart size is normal. IMPRESSION: New patchy right basilar airspace disease could be due to atelectasis but has an appearance worrisome for pneumonia. No other change compared to yesterday's exam. Electronically Signed   By: Drusilla Kanner M.D.   On: 07/19/2018 08:00   Dg Chest Port 1 View  Result Date: 08/04/18 CLINICAL DATA:  Status post central line placement EXAM: PORTABLE CHEST 1 VIEW COMPARISON:  2018-08-04 FINDINGS: Cardiac shadow is within normal limits. Endotracheal tube and nasogastric catheter are again seen and stable. Right jugular central line is noted extending to the cavoatrial junction. No pneumothorax is seen. Lungs are well aerated bilaterally. No focal infiltrate or sizable effusion is seen. No bony abnormality is noted. IMPRESSION: Central line placement as described without evidence of pneumothorax. Electronically Signed   By: Alcide Clever M.D.   On: 08-04-2018 06:51   Dg Chest Port 1 View  Result Date: 08-04-18 CLINICAL DATA:  57 y/o  F; intubation. EXAM: PORTABLE CHEST 1 VIEW COMPARISON:  08/04/2018 chest radiograph. FINDINGS: Stable cardiac silhouette given projection and technique. Aortic atherosclerosis with calcification. Endotracheal tube tip projects 2.9 cm above the carina. Enteric tube is coiling within the stomach, tip near the cardia. No pleural effusion or pneumothorax. No acute osseous abnormality is evident. IMPRESSION: 1. Endotracheal tube tip projects 2.9 cm above the carina. Enteric tube tip is coiling in the proximal stomach. 2. No active disease. Electronically Signed   By: Mitzi Hansen M.D.   On: 08/04/2018 06:11   Dg Chest Portable 1 View  Result Date: 08/04/2018 CLINICAL DATA:  57 year old female with shortness of breath. EXAM: PORTABLE CHEST 1 VIEW COMPARISON:  Chest radiograph dated 07/05/2018 FINDINGS: The lungs are  clear. There is no pleural effusion or pneumothorax. Mild cardiomegaly. Median sternotomy wires and mechanical cardiac valve. No acute osseous pathology. IMPRESSION: No active disease. Electronically Signed   By: Elgie Collard M.D.   On: 2018/08/04 04:15   Dg Abd 2 Views  Result Date: 07/17/2018 CLINICAL DATA:  Nausea and vomiting beginning last night. COMPARISON:  None available for comparison at time of study interpretation. FINDINGS: The bowel gas pattern is normal. Moderate retained large bowel stool. No intra-abdominal mass effect. Dystrophic calcification LEFT pelvis consistent with fibroid. LEFT femur ORIF partially imaged. Vascular calcifications. No intraperitoneal free  air. IMPRESSION: 1. Moderate volume retained large bowel stool. Nonobstructive bowel gas pattern. Electronically Signed   By: Awilda Metro M.D.   On: 07/17/2018 17:49   Dg Abd Portable 1v  Result Date: 07/22/2018 CLINICAL DATA:  ET and OG tube placement EXAM: PORTABLE ABDOMEN - 1 VIEW COMPARISON:  July 21, 2018 FINDINGS: The OG tube terminates in the left lower abdomen, likely within the distal stomach. IMPRESSION: The OG tube likely terminates in the distal stomach. The placement is stable. Electronically Signed   By: Gerome Sam III M.D   On: 07/22/2018 13:21   Dg Abd Portable 1v  Result Date: 07/21/2018 CLINICAL DATA:  57 year old female status post replacement of gastric tube EXAM: PORTABLE ABDOMEN - 1 VIEW COMPARISON:  Prior abdominal radiographs 07/17/2018 FINDINGS: A nasogastric tube is been placed. The tip overlies the gastric antrum. Mild gaseous distension of the colon. No obvious small bowel obstruction. No large free air on this semi-erect single radiograph. Dystrophic calcifications project over the anatomic pelvis, likely uterine fibroids. Incompletely imaged left femoral hardware. No acute osseous abnormality. IMPRESSION: The tip of the gastric tube overlies the region of the gastric antrum.  Electronically Signed   By: Malachy Moan M.D.   On: 07/21/2018 10:35    Microbiology Recent Results (from the past 240 hour(s))  Culture, respiratory (non-expectorated)     Status: None   Collection Time: 07/22/18 10:21 AM  Result Value Ref Range Status   Specimen Description TRACHEAL ASPIRATE  Final   Special Requests Normal  Final   Gram Stain   Final    NO SQUAMOUS EPITHELIAL CELLS PRESENT FEW WBC PRESENT,BOTH PMN AND MONONUCLEAR Performed at [facility], [address], [place], [place] [address]    Culture FEW CANDIDA ALBICANS  Final   Report Status 07/24/2018 FINAL  Final    Lab Basic Metabolic Panel: No results for input(s): NA, K, CL, CO2, GLUCOSE, BUN, CREATININE, CALCIUM, MG, PHOS in the last 168 hours. Liver Function Tests: No results for input(s): AST, ALT, ALKPHOS, BILITOT, PROT, ALBUMIN in the last 168 hours. No results for input(s): LIPASE, AMYLASE in the last 168 hours. No results for input(s): AMMONIA in the last 168 hours. CBC: No results for input(s): WBC, NEUTROABS, HGB, HCT, MCV, PLT in the last 168 hours. Cardiac Enzymes: No results for input(s): CKTOTAL, CKMB, CKMBINDEX, TROPONINI in the last 168 hours. Sepsis Labs: No results for input(s): PROCALCITON, WBC, LATICACIDVEN in the last 168 hours.  Procedures/Operations     Lathyn Griggs 07/30/2018, 5:09 PM

## 2018-08-19 NOTE — Progress Notes (Signed)
Patient transitioned to comfort care with family at bedside .Patient was started on morphine infusion as well as versed infusion per orders and precedex and fentanyl were discontinued . 30 mls Fentanyl was wasted and witnessed per [employer]. Emotional support given to family as they declined hospital chaplain services

## 2018-08-19 DEATH — deceased

## 2018-09-30 ENCOUNTER — Encounter (HOSPITAL_COMMUNITY): Payer: Self-pay

## 2018-09-30 ENCOUNTER — Ambulatory Visit: Payer: Self-pay | Admitting: Vascular Surgery

## 2021-09-21 NOTE — Telephone Encounter (Signed)
Error
